# Patient Record
Sex: Male | Born: 1950 | Race: Black or African American | Hispanic: No | Marital: Married | Smoking: Never smoker
Health system: Southern US, Community
[De-identification: ages and names within clinical notes are randomized; demographics above are authoritative.]

## PROBLEM LIST (undated history)

## (undated) DIAGNOSIS — M10071 Idiopathic gout, right ankle and foot: Secondary | ICD-10-CM

## (undated) DIAGNOSIS — I469 Cardiac arrest, cause unspecified: Secondary | ICD-10-CM

## (undated) DIAGNOSIS — I509 Heart failure, unspecified: Secondary | ICD-10-CM

## (undated) DIAGNOSIS — R52 Pain, unspecified: Secondary | ICD-10-CM

## (undated) DIAGNOSIS — R0989 Other specified symptoms and signs involving the circulatory and respiratory systems: Secondary | ICD-10-CM

## (undated) DIAGNOSIS — R29898 Other symptoms and signs involving the musculoskeletal system: Secondary | ICD-10-CM

## (undated) DIAGNOSIS — S7410XA Injury of femoral nerve at hip and thigh level, unspecified leg, initial encounter: Secondary | ICD-10-CM

## (undated) DIAGNOSIS — I10 Essential (primary) hypertension: Secondary | ICD-10-CM

## (undated) DIAGNOSIS — I5023 Acute on chronic systolic (congestive) heart failure: Secondary | ICD-10-CM

## (undated) DIAGNOSIS — K683 Retroperitoneal hematoma: Secondary | ICD-10-CM

## (undated) DIAGNOSIS — N289 Disorder of kidney and ureter, unspecified: Secondary | ICD-10-CM

## (undated) DIAGNOSIS — M545 Low back pain, unspecified: Secondary | ICD-10-CM

## (undated) DIAGNOSIS — M25561 Pain in right knee: Secondary | ICD-10-CM

## (undated) DIAGNOSIS — E785 Hyperlipidemia, unspecified: Secondary | ICD-10-CM

## (undated) DIAGNOSIS — M6282 Rhabdomyolysis: Secondary | ICD-10-CM

## (undated) DIAGNOSIS — Z7901 Long term (current) use of anticoagulants: Secondary | ICD-10-CM

## (undated) DIAGNOSIS — E039 Hypothyroidism, unspecified: Secondary | ICD-10-CM

## (undated) DIAGNOSIS — N189 Chronic kidney disease, unspecified: Secondary | ICD-10-CM

## (undated) DIAGNOSIS — G5720 Lesion of femoral nerve, unspecified lower limb: Secondary | ICD-10-CM

## (undated) DIAGNOSIS — E119 Type 2 diabetes mellitus without complications: Secondary | ICD-10-CM

## (undated) DIAGNOSIS — J9600 Acute respiratory failure, unspecified whether with hypoxia or hypercapnia: Secondary | ICD-10-CM

## (undated) DIAGNOSIS — I4901 Ventricular fibrillation: Secondary | ICD-10-CM

## (undated) DIAGNOSIS — M898X6 Other specified disorders of bone, lower leg: Secondary | ICD-10-CM

## (undated) DIAGNOSIS — I499 Cardiac arrhythmia, unspecified: Secondary | ICD-10-CM

## (undated) DIAGNOSIS — J189 Pneumonia, unspecified organism: Secondary | ICD-10-CM

## (undated) DIAGNOSIS — M25571 Pain in right ankle and joints of right foot: Secondary | ICD-10-CM

## (undated) DIAGNOSIS — E669 Obesity, unspecified: Secondary | ICD-10-CM

## (undated) DIAGNOSIS — Z452 Encounter for adjustment and management of vascular access device: Secondary | ICD-10-CM

## (undated) DIAGNOSIS — N179 Acute kidney failure, unspecified: Secondary | ICD-10-CM

## (undated) DIAGNOSIS — R011 Cardiac murmur, unspecified: Secondary | ICD-10-CM

## (undated) DIAGNOSIS — N184 Chronic kidney disease, stage 4 (severe): Secondary | ICD-10-CM

## (undated) DIAGNOSIS — F40240 Claustrophobia: Secondary | ICD-10-CM

## (undated) DIAGNOSIS — I480 Paroxysmal atrial fibrillation: Secondary | ICD-10-CM

## (undated) DIAGNOSIS — R4182 Altered mental status, unspecified: Secondary | ICD-10-CM

## (undated) DIAGNOSIS — I719 Aortic aneurysm of unspecified site, without rupture: Secondary | ICD-10-CM

## (undated) DIAGNOSIS — I447 Left bundle-branch block, unspecified: Secondary | ICD-10-CM

## (undated) DIAGNOSIS — M549 Dorsalgia, unspecified: Secondary | ICD-10-CM

## (undated) DIAGNOSIS — I5022 Chronic systolic (congestive) heart failure: Secondary | ICD-10-CM

## (undated) DIAGNOSIS — G8929 Other chronic pain: Secondary | ICD-10-CM

## (undated) DIAGNOSIS — J9601 Acute respiratory failure with hypoxia: Secondary | ICD-10-CM

## (undated) DIAGNOSIS — G9340 Encephalopathy, unspecified: Secondary | ICD-10-CM

## (undated) DIAGNOSIS — E1169 Type 2 diabetes mellitus with other specified complication: Secondary | ICD-10-CM

## (undated) DIAGNOSIS — R0609 Other forms of dyspnea: Secondary | ICD-10-CM

## (undated) DIAGNOSIS — D62 Acute posthemorrhagic anemia: Secondary | ICD-10-CM

## (undated) DIAGNOSIS — I4892 Unspecified atrial flutter: Secondary | ICD-10-CM

## (undated) DIAGNOSIS — R58 Hemorrhage, not elsewhere classified: Secondary | ICD-10-CM

## (undated) DIAGNOSIS — I7789 Other specified disorders of arteries and arterioles: Secondary | ICD-10-CM

## (undated) DIAGNOSIS — I351 Nonrheumatic aortic (valve) insufficiency: Secondary | ICD-10-CM

## (undated) DIAGNOSIS — I8390 Asymptomatic varicose veins of unspecified lower extremity: Secondary | ICD-10-CM

## (undated) DIAGNOSIS — Z1211 Encounter for screening for malignant neoplasm of colon: Secondary | ICD-10-CM

## (undated) DIAGNOSIS — Z79899 Other long term (current) drug therapy: Secondary | ICD-10-CM

## (undated) DIAGNOSIS — K648 Other hemorrhoids: Secondary | ICD-10-CM

## (undated) DIAGNOSIS — R5381 Other malaise: Secondary | ICD-10-CM

## (undated) DIAGNOSIS — I5042 Chronic combined systolic (congestive) and diastolic (congestive) heart failure: Secondary | ICD-10-CM

## (undated) HISTORY — DX: Dorsalgia, unspecified: M54.9

## (undated) HISTORY — DX: Paroxysmal atrial fibrillation: I48.0

## (undated) HISTORY — DX: Retroperitoneal hematoma: K68.3

## (undated) HISTORY — DX: Type 2 diabetes mellitus with other specified complication: E11.69

## (undated) HISTORY — DX: Rhabdomyolysis: M62.82

## (undated) HISTORY — DX: Encounter for screening for malignant neoplasm of colon: Z12.11

## (undated) HISTORY — DX: Pain in right knee: M25.561

## (undated) HISTORY — DX: Other specified disorders of bone, lower leg: M89.8X6

## (undated) HISTORY — DX: Acute on chronic systolic (congestive) heart failure: I50.23

## (undated) HISTORY — DX: Unspecified atrial flutter: I48.92

## (undated) HISTORY — DX: Cardiac arrhythmia, unspecified: I49.9

## (undated) HISTORY — DX: Acute posthemorrhagic anemia: D62

## (undated) HISTORY — DX: Long term (current) use of anticoagulants: Z79.01

## (undated) HISTORY — DX: Pain, unspecified: R52

## (undated) HISTORY — DX: Chronic combined systolic (congestive) and diastolic (congestive) heart failure: I50.42

## (undated) HISTORY — DX: Acute kidney failure, unspecified: N17.9

## (undated) HISTORY — DX: Chronic systolic (congestive) heart failure: I50.22

## (undated) HISTORY — DX: Chronic kidney disease, stage 4 (severe): N18.4

## (undated) HISTORY — DX: Other symptoms and signs involving the musculoskeletal system: R29.898

## (undated) HISTORY — DX: Type 2 diabetes mellitus without complications: E11.9

## (undated) HISTORY — DX: Hemorrhage, not elsewhere classified: R58

## (undated) HISTORY — DX: Encephalopathy, unspecified: G93.40

## (undated) HISTORY — DX: Pneumonia, unspecified organism: J18.9

## (undated) HISTORY — DX: Low back pain, unspecified: M54.50

## (undated) HISTORY — DX: Other long term (current) drug therapy: Z79.899

## (undated) HISTORY — DX: Chronic kidney disease, unspecified: N18.9

## (undated) HISTORY — DX: Pain in right ankle and joints of right foot: M25.571

## (undated) HISTORY — DX: Aortic aneurysm of unspecified site, without rupture: I71.9

## (undated) HISTORY — DX: Disorder of kidney and ureter, unspecified: N28.9

## (undated) HISTORY — DX: Hypothyroidism, unspecified: E03.9

## (undated) HISTORY — DX: Other specified symptoms and signs involving the circulatory and respiratory systems: R09.89

## (undated) HISTORY — DX: Acute respiratory failure, unspecified whether with hypoxia or hypercapnia: J96.00

## (undated) HISTORY — DX: Idiopathic gout, right ankle and foot: M10.071

## (undated) HISTORY — DX: Acute respiratory failure with hypoxia: J96.01

## (undated) HISTORY — DX: Low back pain: M54.5

## (undated) HISTORY — DX: Left bundle-branch block, unspecified: I44.7

## (undated) HISTORY — DX: Ventricular fibrillation: I49.01

## (undated) HISTORY — DX: Obesity, unspecified: E66.9

## (undated) HISTORY — DX: Other malaise: R53.81

## (undated) HISTORY — DX: Other hemorrhoids: K64.8

## (undated) HISTORY — DX: Cardiac arrest, cause unspecified: I46.9

## (undated) HISTORY — DX: Encounter for adjustment and management of vascular access device: Z45.2

## (undated) HISTORY — DX: Other specified disorders of arteries and arterioles: I77.89

## (undated) HISTORY — DX: Hyperlipidemia, unspecified: E78.5

## (undated) HISTORY — DX: Altered mental status, unspecified: R41.82

## (undated) HISTORY — DX: Lesion of femoral nerve, unspecified lower limb: G57.20

## (undated) HISTORY — DX: Injury of femoral nerve at hip and thigh level, unspecified leg, initial encounter: S74.10XA

## (undated) HISTORY — DX: Nonrheumatic aortic (valve) insufficiency: I35.1

## (undated) SURGERY — CARDIOVERSION
Anesthesia: Moderate Sedation

## (undated) SURGERY — COLONOSCOPY
Anesthesia: Monitor Anesthesia Care

---

## 2010-09-25 HISTORY — PX: FINGER SURGERY: SHX640

## 2010-09-25 HISTORY — PX: OTHER SURGICAL HISTORY: SHX169

## 2011-03-23 ENCOUNTER — Emergency Department (HOSPITAL_COMMUNITY): Payer: BC Managed Care – PPO

## 2011-03-23 ENCOUNTER — Emergency Department (HOSPITAL_COMMUNITY)
Admission: EM | Admit: 2011-03-23 | Discharge: 2011-03-23 | Disposition: A | Discharge status: Home / Self Care | Payer: BC Managed Care – PPO | Attending: Emergency Medicine | Admitting: Emergency Medicine

## 2011-03-23 DIAGNOSIS — M7989 Other specified soft tissue disorders: Secondary | ICD-10-CM | POA: Insufficient documentation

## 2011-03-23 DIAGNOSIS — M8569 Other cyst of bone, multiple sites: Secondary | ICD-10-CM | POA: Insufficient documentation

## 2011-03-23 DIAGNOSIS — M79609 Pain in unspecified limb: Secondary | ICD-10-CM | POA: Insufficient documentation

## 2011-03-23 DIAGNOSIS — IMO0002 Reserved for concepts with insufficient information to code with codable children: Secondary | ICD-10-CM | POA: Insufficient documentation

## 2011-03-23 DIAGNOSIS — X500XXA Overexertion from strenuous movement or load, initial encounter: Secondary | ICD-10-CM | POA: Insufficient documentation

## 2011-03-23 DIAGNOSIS — Y93H2 Activity, gardening and landscaping: Secondary | ICD-10-CM | POA: Insufficient documentation

## 2011-03-24 ENCOUNTER — Other Ambulatory Visit: Payer: Self-pay | Admitting: General Surgery

## 2011-03-24 DIAGNOSIS — H219 Unspecified disorder of iris and ciliary body: Secondary | ICD-10-CM

## 2011-03-31 ENCOUNTER — Other Ambulatory Visit: Payer: Self-pay

## 2011-05-10 ENCOUNTER — Ambulatory Visit (HOSPITAL_BASED_OUTPATIENT_CLINIC_OR_DEPARTMENT_OTHER)
Admission: RE | Admit: 2011-05-10 | Discharge: 2011-05-10 | Disposition: A | Discharge status: Home / Self Care | Payer: BC Managed Care – PPO | Source: Ambulatory Visit | Attending: Orthopedic Surgery | Admitting: Orthopedic Surgery

## 2011-05-10 ENCOUNTER — Other Ambulatory Visit: Payer: Self-pay | Admitting: Orthopedic Surgery

## 2011-05-10 DIAGNOSIS — Z01810 Encounter for preprocedural cardiovascular examination: Secondary | ICD-10-CM | POA: Insufficient documentation

## 2011-05-10 DIAGNOSIS — E669 Obesity, unspecified: Secondary | ICD-10-CM | POA: Insufficient documentation

## 2011-05-10 DIAGNOSIS — M8448XA Pathological fracture, other site, initial encounter for fracture: Secondary | ICD-10-CM | POA: Insufficient documentation

## 2011-05-10 DIAGNOSIS — I10 Essential (primary) hypertension: Secondary | ICD-10-CM | POA: Insufficient documentation

## 2011-05-11 LAB — POCT I-STAT, CHEM 8
BUN: 20 mg/dL (ref 6–23)
Calcium, Ion: 1.15 mmol/L (ref 1.12–1.32)
Chloride: 106 mEq/L (ref 96–112)
Creatinine, Ser: 1.4 mg/dL — ABNORMAL HIGH (ref 0.50–1.35)
Glucose, Bld: 113 mg/dL — ABNORMAL HIGH (ref 70–99)
HCT: 43 % (ref 39.0–52.0)
Hemoglobin: 14.6 g/dL (ref 13.0–17.0)
Potassium: 3.2 mEq/L — ABNORMAL LOW (ref 3.5–5.1)
Sodium: 143 mEq/L (ref 135–145)
TCO2: 26 mmol/L (ref 0–100)

## 2011-05-23 NOTE — Op Note (Signed)
  [name], [name] NO.:  [id number]  MEDICAL RECORD NO.:  [id number]  LOCATION:                                 FACILITY:  PHYSICIAN:  Sheral Apley. 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  PROCEDURE:  Curettage and bone grafting above.  SURGEON:  Sheral Apley. Burney Gauze, MD  ANESTHESIA:  General.  ASSISTANT:  Kuzma.  TOURNIQUET TIME:  51 minutes.  COMPLICATIONS:  None.  DRAINS:  None.  One specimen sent.  The patient was taken to the operating suite after induction of adequate general anesthesia.  Right upper extremity prepped and draped in sterile fashion.  An Esmarch was used to exsanguinate the limb.  Tourniquet was inflated to 250 mmHg.  At this point in time incision was made over the middle phalanx of the right ring finger.  Skin was incised. A small incision was made in the extensor mechanism.  Cortical window was made. A large enchondromatous-type lesion was encountered.  It was fully curetted using several size and shape curettes.  It was thoroughly irrigated.  Intraoperative fluoroscopy was used to determine that the tumor had been essentially resected.  It was then packed full with Trinity bone graft.  Intraoperative fluoroscopy confirmed that the pathologic fracture had been adequately debrided and bone grafted.  The finger was stable and no fixation was used.  It was irrigated, loosely closed in layers of 4-0 Vicryl to realign the extensor mechanism, 4-0 nylon on the skin.  Xeroform, 4x4s and ulnar gutter splint was applied.  The patient tolerated the procedure well and went to recovery room in stable  fashion.     Sheral Apley Burney Gauze, M.D.     MAW/MEDQ  D:  05/10/2011  T:  05/10/2011  Job:  [id number]  Electronically Signed by Charlotte Crumb M.D. on 05/23/2011 09:36:30 AM

## 2011-12-25 ENCOUNTER — Other Ambulatory Visit: Payer: Self-pay | Admitting: Internal Medicine

## 2011-12-25 ENCOUNTER — Ambulatory Visit
Admission: RE | Admit: 2011-12-25 | Discharge: 2011-12-25 | Disposition: A | Discharge status: Home / Self Care | Payer: BC Managed Care – PPO | Source: Ambulatory Visit | Attending: Internal Medicine | Admitting: Internal Medicine

## 2011-12-25 DIAGNOSIS — R05 Cough: Secondary | ICD-10-CM

## 2012-01-24 DIAGNOSIS — R0600 Dyspnea, unspecified: Secondary | ICD-10-CM

## 2012-01-24 DIAGNOSIS — R0609 Other forms of dyspnea: Secondary | ICD-10-CM

## 2012-01-24 HISTORY — DX: Other forms of dyspnea: R06.09

## 2012-01-24 HISTORY — DX: Dyspnea, unspecified: R06.00

## 2012-01-26 ENCOUNTER — Other Ambulatory Visit: Payer: Self-pay | Admitting: Interventional Cardiology

## 2012-02-01 ENCOUNTER — Inpatient Hospital Stay (HOSPITAL_BASED_OUTPATIENT_CLINIC_OR_DEPARTMENT_OTHER): Admit: 2012-02-01 | Payer: BC Managed Care – PPO | Admitting: Interventional Cardiology

## 2012-02-01 ENCOUNTER — Encounter (HOSPITAL_BASED_OUTPATIENT_CLINIC_OR_DEPARTMENT_OTHER): Payer: Self-pay

## 2012-02-01 SURGERY — JV LEFT AND RIGHT HEART CATHETERIZATION WITH CORONARY ANGIOGRAM
Anesthesia: Moderate Sedation

## 2012-02-13 ENCOUNTER — Inpatient Hospital Stay (HOSPITAL_COMMUNITY)
Admission: AD | Admit: 2012-02-13 | Discharge: 2012-02-15 | DRG: 544 | Disposition: A | Discharge status: Home / Self Care | Payer: BC Managed Care – PPO | Source: Ambulatory Visit | Attending: Interventional Cardiology | Admitting: Interventional Cardiology

## 2012-02-13 ENCOUNTER — Encounter (HOSPITAL_COMMUNITY): Payer: Self-pay | Admitting: Interventional Cardiology

## 2012-02-13 DIAGNOSIS — I351 Nonrheumatic aortic (valve) insufficiency: Secondary | ICD-10-CM

## 2012-02-13 DIAGNOSIS — I129 Hypertensive chronic kidney disease with stage 1 through stage 4 chronic kidney disease, or unspecified chronic kidney disease: Secondary | ICD-10-CM | POA: Diagnosis present

## 2012-02-13 DIAGNOSIS — Z79899 Other long term (current) drug therapy: Secondary | ICD-10-CM

## 2012-02-13 DIAGNOSIS — Z7982 Long term (current) use of aspirin: Secondary | ICD-10-CM

## 2012-02-13 DIAGNOSIS — I359 Nonrheumatic aortic valve disorder, unspecified: Secondary | ICD-10-CM | POA: Diagnosis present

## 2012-02-13 DIAGNOSIS — Z6833 Body mass index (BMI) 33.0-33.9, adult: Secondary | ICD-10-CM

## 2012-02-13 DIAGNOSIS — I5042 Chronic combined systolic (congestive) and diastolic (congestive) heart failure: Secondary | ICD-10-CM | POA: Diagnosis present

## 2012-02-13 DIAGNOSIS — E669 Obesity, unspecified: Secondary | ICD-10-CM | POA: Diagnosis present

## 2012-02-13 DIAGNOSIS — I5022 Chronic systolic (congestive) heart failure: Secondary | ICD-10-CM | POA: Diagnosis present

## 2012-02-13 DIAGNOSIS — N179 Acute kidney failure, unspecified: Secondary | ICD-10-CM | POA: Diagnosis present

## 2012-02-13 DIAGNOSIS — I10 Essential (primary) hypertension: Secondary | ICD-10-CM | POA: Diagnosis present

## 2012-02-13 DIAGNOSIS — N189 Chronic kidney disease, unspecified: Secondary | ICD-10-CM | POA: Diagnosis present

## 2012-02-13 DIAGNOSIS — E785 Hyperlipidemia, unspecified: Secondary | ICD-10-CM

## 2012-02-13 DIAGNOSIS — I7789 Other specified disorders of arteries and arterioles: Secondary | ICD-10-CM | POA: Diagnosis present

## 2012-02-13 DIAGNOSIS — I447 Left bundle-branch block, unspecified: Secondary | ICD-10-CM

## 2012-02-13 DIAGNOSIS — I5023 Acute on chronic systolic (congestive) heart failure: Principal | ICD-10-CM | POA: Diagnosis present

## 2012-02-13 DIAGNOSIS — I491 Atrial premature depolarization: Secondary | ICD-10-CM | POA: Diagnosis present

## 2012-02-13 DIAGNOSIS — F40298 Other specified phobia: Secondary | ICD-10-CM | POA: Diagnosis present

## 2012-02-13 DIAGNOSIS — I498 Other specified cardiac arrhythmias: Secondary | ICD-10-CM | POA: Diagnosis present

## 2012-02-13 HISTORY — DX: Asymptomatic varicose veins of unspecified lower extremity: I83.90

## 2012-02-13 HISTORY — DX: Obesity, unspecified: E66.9

## 2012-02-13 HISTORY — DX: Cardiac arrhythmia, unspecified: I49.9

## 2012-02-13 HISTORY — DX: Heart failure, unspecified: I50.9

## 2012-02-13 HISTORY — DX: Low back pain: M54.5

## 2012-02-13 HISTORY — DX: Cardiac murmur, unspecified: R01.1

## 2012-02-13 HISTORY — DX: Other forms of dyspnea: R06.09

## 2012-02-13 HISTORY — DX: Claustrophobia: F40.240

## 2012-02-13 HISTORY — DX: Other specified disorders of arteries and arterioles: I77.89

## 2012-02-13 HISTORY — DX: Other chronic pain: G89.29

## 2012-02-13 HISTORY — DX: Low back pain, unspecified: M54.50

## 2012-02-13 HISTORY — DX: Chronic systolic (congestive) heart failure: I50.22

## 2012-02-13 HISTORY — DX: Essential (primary) hypertension: I10

## 2012-02-13 HISTORY — DX: Hyperlipidemia, unspecified: E78.5

## 2012-02-13 HISTORY — DX: Nonrheumatic aortic (valve) insufficiency: I35.1

## 2012-02-13 HISTORY — DX: Left bundle-branch block, unspecified: I44.7

## 2012-02-13 LAB — COMPREHENSIVE METABOLIC PANEL
ALT: 198 U/L — ABNORMAL HIGH (ref 0–53)
AST: 104 U/L — ABNORMAL HIGH (ref 0–37)
Albumin: 3.5 g/dL (ref 3.5–5.2)
Alkaline Phosphatase: 58 U/L (ref 39–117)
BUN: 31 mg/dL — ABNORMAL HIGH (ref 6–23)
Chloride: 103 mEq/L (ref 96–112)
Potassium: 2.8 mEq/L — ABNORMAL LOW (ref 3.5–5.1)
Sodium: 141 mEq/L (ref 135–145)
Total Bilirubin: 0.6 mg/dL (ref 0.3–1.2)
Total Protein: 6.3 g/dL (ref 6.0–8.3)

## 2012-02-13 LAB — LIPID PANEL
Cholesterol: 161 mg/dL (ref 0–200)
Total CHOL/HDL Ratio: 3.8 RATIO
Triglycerides: 151 mg/dL — ABNORMAL HIGH (ref <150)
VLDL: 30 mg/dL (ref 0–40)

## 2012-02-13 LAB — CARDIAC PANEL(CRET KIN+CKTOT+MB+TROPI)
CK, MB: 5 ng/mL — ABNORMAL HIGH (ref 0.3–4.0)
Relative Index: 1.4 (ref 0.0–2.5)
Total CK: 366 U/L — ABNORMAL HIGH (ref 7–232)

## 2012-02-13 LAB — CBC
MCHC: 36.1 g/dL — ABNORMAL HIGH (ref 30.0–36.0)
Platelets: 283 10*3/uL (ref 150–400)
RDW: 14.6 % (ref 11.5–15.5)
WBC: 8.9 10*3/uL (ref 4.0–10.5)

## 2012-02-13 LAB — PROTIME-INR: INR: 1.13 (ref 0.00–1.49)

## 2012-02-13 MED ORDER — SODIUM CHLORIDE 0.9 % IJ SOLN
3.0000 mL | Freq: Two times a day (BID) | INTRAMUSCULAR | Status: DC
  Administered 2012-02-13 – 2012-02-15 (×4): 3 mL via INTRAVENOUS

## 2012-02-13 MED ORDER — LISINOPRIL 40 MG PO TABS
40.0000 mg | ORAL_TABLET | Freq: Every day | ORAL | Status: DC
  Administered 2012-02-13 – 2012-02-15 (×3): 40 mg via ORAL
  Filled 2012-02-13 (×3): qty 1

## 2012-02-13 MED ORDER — SODIUM CHLORIDE 0.9 % IJ SOLN: 3.0000 mL | INTRAMUSCULAR | Status: DC | PRN

## 2012-02-13 MED ORDER — POTASSIUM CHLORIDE CRYS ER 20 MEQ PO TBCR
40.0000 meq | EXTENDED_RELEASE_TABLET | Freq: Three times a day (TID) | ORAL | Status: DC
  Administered 2012-02-13: 40 meq via ORAL
  Filled 2012-02-13 (×3): qty 2

## 2012-02-13 MED ORDER — ENOXAPARIN SODIUM 40 MG/0.4ML ~~LOC~~ SOLN
40.0000 mg | SUBCUTANEOUS | Status: DC
  Administered 2012-02-13: 40 mg via SUBCUTANEOUS
  Filled 2012-02-13 (×2): qty 0.4

## 2012-02-13 MED ORDER — ALPRAZOLAM 0.5 MG PO TABS
0.5000 mg | ORAL_TABLET | Freq: Three times a day (TID) | ORAL | Status: DC | PRN
  Administered 2012-02-13 – 2012-02-15 (×2): 0.5 mg via ORAL
  Filled 2012-02-13 (×2): qty 1

## 2012-02-13 MED ORDER — SODIUM CHLORIDE 0.9 % IV SOLN: 250.0000 mL | INTRAVENOUS | Status: DC | PRN

## 2012-02-13 MED ORDER — FUROSEMIDE 10 MG/ML IJ SOLN
40.0000 mg | Freq: Two times a day (BID) | INTRAMUSCULAR | Status: DC
  Administered 2012-02-13 – 2012-02-14 (×2): 40 mg via INTRAVENOUS
  Filled 2012-02-13 (×4): qty 4

## 2012-02-13 MED ORDER — ONDANSETRON HCL 4 MG/2ML IJ SOLN: 4.0000 mg | Freq: Four times a day (QID) | INTRAMUSCULAR | Status: DC | PRN

## 2012-02-13 MED ORDER — ASPIRIN EC 81 MG PO TBEC
81.0000 mg | DELAYED_RELEASE_TABLET | Freq: Every day | ORAL | Status: DC
  Administered 2012-02-14 – 2012-02-15 (×2): 81 mg via ORAL
  Filled 2012-02-13 (×3): qty 1

## 2012-02-13 MED ORDER — METOPROLOL TARTRATE 50 MG PO TABS
50.0000 mg | ORAL_TABLET | Freq: Two times a day (BID) | ORAL | Status: DC
  Administered 2012-02-13 – 2012-02-14 (×3): 50 mg via ORAL
  Filled 2012-02-13 (×5): qty 1

## 2012-02-13 MED ORDER — ACETAMINOPHEN 325 MG PO TABS: 650.0000 mg | ORAL_TABLET | ORAL | Status: DC | PRN

## 2012-02-13 NOTE — H&P (Signed)
Eric [name] is a 61 y.o. male  Admit date: 02/13/2012  Referring Physician: Glendale Chard, MD Primary Cardiologist::   H.W.B. Blenda Bridegroom, MD Chief complaint / reason for admission:  Acute on chronic CHF  HPI: Eric [name] is 61 years of age and in March began experiencing dyspnea on exertion, palpitations, and lower extremity swelling. This was during a time when his hypertension medication regimen was being adjusted. He has a greater than 20 year history of hypertension on medical therapy. There is no prior history of coronary disease or heart disease. When we saw him in April he was noted to have a left bundle branch block on EKG,  a gallop rhythm on exam, and echo demonstrated significant left ventricular systolic dysfunction with an EF in the 25-35% range, aortic root enlargement, and moderate AR. He additionally had a Holter monitor performed which demonstrated frequent PACs. No significant ventricular ectopy was noted.  Over the past 3-4 weeks, we have attempted to make adjustments in his medical regimen to treat heart failure. This has included discontinuing diltiazem, doxazosin, and triamterene HCTZ. We have added metoprolol and furosemide(within the past 24 hours). He was already taking lisinopril. After the initiation of beta blocker therapy, he became somewhat orthopneic, and is being admitted to the hospital for more aggressive therapy.    PMH:    Past Medical History  Diagnosis Date  . Claustrophobia   . Heart murmur   . CHF (congestive heart failure)   . Hypertension     PSH:    Past Surgical History  Procedure Date  . Skin melanocytoma excision 2012  . Finger surgery 2012    ALLERGIES:   Review of patient's allergies indicates no known allergies.  Prior to Admit Meds:   No prescriptions prior to admission   Family HX:    Family History  Problem Relation Age of Onset  . Hypertension Mother   . Heart disease Mother   . Hypertension Father   . Heart disease  Father   . Heart failure Mother   . Diabetes Mother   . Heart failure Father   . Heart failure Mother    Social HX:    History   Social History  . Marital Status: Married    Spouse Name: Eric [name]    Number of Children: 1  . Years of Education: N/A   Occupational History  .     Social History Main Topics  . Smoking status: Never Smoker   . Smokeless tobacco: Not on file  . Alcohol Use: No  . Drug Use: No  . Sexually Active: Yes   Other Topics Concern  . 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     ROS:he denies neurological complaints. No recent ankle edema. He has not had syncope. There is no history of arrhythmia. No GI bleeding. No history of bowel heart disease. Hypertension is been a long-standing problem. He has anxiety and claustrophobia.  Physical Exam: Pulse 121, temperature 98.8 F (37.1 C), temperature source Oral, height 5' 9.5" (1.765 m), weight 107.004 kg (235 lb 14.4 oz), SpO2 95.00%.    Patient is doing well although he appears apprehensive.  The skin is warm and dry.  No significant JVD is noted.  The lungs reveal faint basilar crackles.  The cardiac exam reveals a rapid and irregular rhythm. Gallop is heard.  Abdomen soft. No fluid wave is  noted. The liver is nonpalpable.  Extremities reveal no edema.  Neurological exam is intact. Labs:   Lab Results  Component Value Date   WBC 8.9 02/13/2012   HGB 14.2 02/13/2012   HCT 39.3 02/13/2012   MCV 85.6 02/13/2012   PLT 283 02/13/2012     Lab 02/13/12 1659  NA 141  K 2.8*  CL 103  CO2 23  BUN 31*  CREATININE 1.53*  CALCIUM 9.0  PROT 6.3  BILITOT 0.6  ALKPHOS 58  ALT 198*  AST 104*  GLUCOSE 108*   Lab Results  Component Value Date   CKTOTAL 366* 02/13/2012   CKMB 5.0* 02/13/2012   TROPONINI <0.30 02/13/2012   ECHO:  Physician Review 01/24/2012 Conclusions: 1. Moderate left ventricular dilatation. 2. Left  ventricular ejection fraction estimated by 2D at 30-35 percent. 3. There is moderate concentric left ventricle hypertrophy. 4. Analysis of mitral valve inflow, pulmonary vein Doppler and tissue Doppler suggests grade II diastolic dysfunction with elevated left atrial pressure. 5. There is moderate aortic root dilatation. 6. Moderate aortic valve regurgitation. 7. Mild mitral valve regurgitation. 8. Moderate left atrial enlargement. 9. Mild pulmonic valve regurgitation. 10. There is mild tricuspid regurgitation. 11. Mildly elevated estimated right ventricular systolic pressure.   Radiology:  pending  EKG:   pending  ASSESSMENT:   1. Acute on Chronic decompensated systolic heart failure with LVEF 30-35%  2. Obesity  3. Moderate AR by echo in association aortic root enlargement  4. Claustrophobia  Plan:  1. Aggressive diuresis 2. Eventual left and right heart cath 3. Determine the role if any that AR is playing.  Eric [name] 02/13/2012 6:11 PM

## 2012-02-14 ENCOUNTER — Inpatient Hospital Stay (HOSPITAL_COMMUNITY): Payer: BC Managed Care – PPO

## 2012-02-14 LAB — IRON AND TIBC: TIBC: 297 ug/dL (ref 215–435)

## 2012-02-14 LAB — BASIC METABOLIC PANEL
BUN: 31 mg/dL — ABNORMAL HIGH (ref 6–23)
CO2: 25 mEq/L (ref 19–32)
Chloride: 106 mEq/L (ref 96–112)
Glucose, Bld: 113 mg/dL — ABNORMAL HIGH (ref 70–99)
Potassium: 2.8 mEq/L — ABNORMAL LOW (ref 3.5–5.1)

## 2012-02-14 LAB — POTASSIUM: Potassium: 3.2 mEq/L — ABNORMAL LOW (ref 3.5–5.1)

## 2012-02-14 LAB — HEMOGLOBIN A1C: Mean Plasma Glucose: 128 mg/dL — ABNORMAL HIGH (ref <117)

## 2012-02-14 MED ORDER — SPIRONOLACTONE 25 MG PO TABS
25.0000 mg | ORAL_TABLET | Freq: Two times a day (BID) | ORAL | Status: DC
  Administered 2012-02-14 – 2012-02-15 (×3): 25 mg via ORAL
  Filled 2012-02-14 (×5): qty 1

## 2012-02-14 MED ORDER — POTASSIUM CHLORIDE CRYS ER 20 MEQ PO TBCR
40.0000 meq | EXTENDED_RELEASE_TABLET | Freq: Three times a day (TID) | ORAL | Status: DC
  Administered 2012-02-14 (×3): 40 meq via ORAL
  Filled 2012-02-14 (×4): qty 2

## 2012-02-14 MED ORDER — POTASSIUM CHLORIDE CRYS ER 20 MEQ PO TBCR: 60.0000 meq | EXTENDED_RELEASE_TABLET | Freq: Three times a day (TID) | ORAL | Status: DC

## 2012-02-14 MED ORDER — FUROSEMIDE 10 MG/ML IJ SOLN
80.0000 mg | Freq: Three times a day (TID) | INTRAMUSCULAR | Status: DC
  Administered 2012-02-14 – 2012-02-15 (×3): 80 mg via INTRAVENOUS
  Filled 2012-02-14 (×7): qty 8

## 2012-02-14 MED ORDER — FUROSEMIDE 10 MG/ML IJ SOLN
40.0000 mg | Freq: Four times a day (QID) | INTRAMUSCULAR | Status: DC
  Filled 2012-02-14 (×4): qty 4

## 2012-02-14 NOTE — Progress Notes (Signed)
Patient Name: Eric [name] Date of Encounter: 02/14/2012    SUBJECTIVE:Better night than previous and able to sleep. Has no chest pain or other complaint.  TELEMETRY:  Sinus tach with PAC's: Filed Vitals:   02/13/12 1904 02/13/12 2042 02/14/12 0216 02/14/12 0601  BP: 134/88 124/84 110/76 131/80  Pulse: 108 94 100 64  Temp:  98.4 F (36.9 C) 98.1 F (36.7 C) 98.6 F (37 C)  TempSrc:  Oral Oral Oral  Resp:  20 18 18   Height:      Weight:    106.414 kg (234 lb 9.6 oz)  SpO2: 96% 98% 100% 96%    Intake/Output Summary (Last 24 hours) at 02/14/12 0838 Last data filed at 02/14/12 0825  Gross per 24 hour  Intake    613 ml  Output   1375 ml  Net   -762 ml    LABS: Basic Metabolic Panel:  Basename 02/14/12 0620 02/13/12 1659  NA 142 141  K 2.8* 2.8*  CL 106 103  CO2 25 23  GLUCOSE 113* 108*  BUN 31* 31*  CREATININE 1.54* 1.53*  CALCIUM 8.8 9.0  MG -- 2.0  PHOS -- --   CBC:  Basename 02/13/12 1659  WBC 8.9  NEUTROABS --  HGB 14.2  HCT 39.3  MCV 85.6  PLT 283   Cardiac Enzymes:  Basename 02/13/12 1659  CKTOTAL 366*  CKMB 5.0*  CKMBINDEX --  TROPONINI <0.30   BNP    Component Value Date/Time   PROBNP 7452.0* 02/13/2012 1828  . Fasting Lipid Panel:  Basename 02/13/12 1829  CHOL 161  HDL 42  LDLCALC 89  TRIG 151*  CHOLHDL 3.8  LDLDIRECT --    Radiology/Studies:   RADIOLOGY REPORT*  Clinical Data: Follow-up congestive heart failure.  CHEST - 2 VIEW  Comparison: 12/25/2011  Findings: Moderate cardiomegaly remains stable. Symmetric  interstitial infiltrates and bibasilar Kerley B lines show no  significant change, consistent with mild pulmonary edema. Mild  increase in size of tiny bilateral pleural effusions noted. No  evidence of pulmonary consolidation.  IMPRESSION:  Mild congestive heart failure, with increased size of tiny  bilateral pleural effusions and stable mild interstitial edema.  Original Report Authenticated By: Marlaine Hind,  M.D.  Physical Exam: Blood pressure 131/80, pulse 64, temperature 98.6 F (37 C), temperature source Oral, resp. rate 18, height 5' 9.5" (1.765 m), weight 106.414 kg (234 lb 9.6 oz), SpO2 96.00%. Weight change:    No neck vein distension  Decreased basilar breath sounds  No edema.  ASSESSMENT:  1. Acute on chronic systolic heart failure, with mild diuresis  2. Hypertension, improved  3. Acute on chronic kidney injury   Plan:  1. Increase diuresis 2. Replete potassium 3. Discussed the severity of his problem and possible need for advanced heart failure measures such as AICD/BiV resynchronization, and ischemic evaluation once controlled on medical regimen.  Demetrios Isaacs 02/14/2012, 8:38 AM

## 2012-02-14 NOTE — Progress Notes (Signed)
PT K 2.8 this AM. Pt has 40 IV lasix BID and 40 PO KCL TID. Dr. Lilia Argue notified. Orders to increase PO KCL to 68mEq TID. Will continue to monitor.

## 2012-02-15 LAB — BASIC METABOLIC PANEL
Chloride: 104 mEq/L (ref 96–112)
GFR calc Af Amer: 57 mL/min — ABNORMAL LOW (ref >90)
Potassium: 3.1 mEq/L — ABNORMAL LOW (ref 3.5–5.1)
Sodium: 143 mEq/L (ref 135–145)

## 2012-02-15 MED ORDER — SPIRONOLACTONE 25 MG PO TABS: 25.0000 mg | ORAL_TABLET | Freq: Two times a day (BID) | ORAL | Status: DC

## 2012-02-15 MED ORDER — POTASSIUM CHLORIDE CRYS ER 20 MEQ PO TBCR: 20.0000 meq | EXTENDED_RELEASE_TABLET | Freq: Two times a day (BID) | ORAL | Status: DC

## 2012-02-15 MED ORDER — FUROSEMIDE 40 MG PO TABS: 40.0000 mg | ORAL_TABLET | Freq: Two times a day (BID) | ORAL | Status: DC

## 2012-02-15 MED ORDER — METOPROLOL TARTRATE 50 MG PO TABS
75.0000 mg | ORAL_TABLET | Freq: Two times a day (BID) | ORAL | Status: DC
  Administered 2012-02-15: 75 mg via ORAL
  Filled 2012-02-15 (×2): qty 1

## 2012-02-15 MED ORDER — POTASSIUM CHLORIDE CRYS ER 20 MEQ PO TBCR
60.0000 meq | EXTENDED_RELEASE_TABLET | Freq: Once | ORAL | Status: AC
  Administered 2012-02-15: 60 meq via ORAL

## 2012-02-15 MED ORDER — METOPROLOL TARTRATE 50 MG PO TABS: 75.0000 mg | ORAL_TABLET | Freq: Two times a day (BID) | ORAL | Status: DC

## 2012-02-15 NOTE — Discharge Summary (Addendum)
Patient ID: Eric [name] MRN: [id number] DOB/AGE: Mar 17, 1951 61 y.o.  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 He has an ejection fraction of 35% by echo done recently. Heart these symptoms have been gradually developing for 6 weeks. We made an attempt to treat his heart failure as an outpatient over the past 7-10 days but were unsuccessful. He was admitted to the hospital for more aggressive diuresis and medication manipulation. He has responded nicely with a near 4 L (greater than 8 pound) diuresis and states improved breathing and exertional tolerance. Appropriate therapy has been started and titrated. Hypokalemia has been a problem. He is anxious to be discharged from the hospital. Sinus tachycardia with rates averaging 100 beats per minute persist despite gradual increase in beta blocker dose. He was admitted to the hospital on 75 mg of metoprolol daily and is discharged 01 150 mg daily. Aldactone is been added and his Lasix dose has been doubled. We will add digoxin once potassium is stable.  We also had frank discussions concerning the severe nature of his heart problem and the potential need for advanced heart failure measures including resynchronization therapy/AICD. On maximal medical therapy, we will give 3 months to see if there is LV recovery before proceeding with these measures.  He will have outpatient blood work tomorrow morning and I will see him in the office in 5 days.   Discharge Exam: Blood pressure 107/79, pulse 104,  temperature 97.8 F (36.6 C), temperature source Oral, resp. rate 18, height 5' 9.5" (1.765 m), weight 104.237 kg (229 lb 12.8 oz), SpO2 97.00%.    History gallop is still audible.  Lungs are clear to auscultation and percussion.  Is no lower extremity edema. Labs:   Lab Results  Component Value Date   WBC 8.9 02/13/2012   HGB 14.2 02/13/2012   HCT 39.3 02/13/2012   MCV 85.6 02/13/2012   PLT 283 02/13/2012    Lab 02/15/12 0602 02/13/12 1659  NA 143 --  K 3.1* --  CL 104 --  CO2 26 --  BUN 30* --  CREATININE 1.50* --  CALCIUM 8.5 --  PROT -- 6.3  BILITOT -- 0.6  ALKPHOS -- 58  ALT -- 198*  AST -- 104*  GLUCOSE 109* --   Lab Results  Component Value Date   CKTOTAL 366* 02/13/2012   CKMB 5.0* 02/13/2012   TROPONINI <0.30 02/13/2012    Lab Results  Component Value Date   CHOL 161 02/13/2012   Lab Results  Component Value Date   HDL 42 02/13/2012   Lab Results  Component Value Date   LDLCALC 89 02/13/2012   Lab Results  Component Value Date   TRIG 151* 02/13/2012   Lab Results  Component Value Date   CHOLHDL 3.8 02/13/2012   No results found for this basename: LDLDIRECT     Discharge weight: 228.8 pounds  Radiology: Chest x-ray on admission revealed cardiac enlargement and mild to moderate CHF EKG:  Normal sinus rhythm, left bundle branch block  FOLLOW UP PLANS AND APPOINTMENTS  Medication List  As of 02/15/2012  9:29 AM   STOP taking these medications  ibuprofen 200 MG tablet      metoprolol succinate 50 MG 24 hr tablet         TAKE these medications         aspirin 81 MG tablet   Take 81 mg by mouth 2 (two) times daily.      azelastine 137 MCG/SPRAY nasal spray   Commonly known as: ASTELIN   Place 2 sprays into the nose daily. Use in each nostril as directed      diazepam 5 MG tablet   Commonly known as: VALIUM   Take 5 mg by mouth every 6 (six) hours as needed. For sleep      fluticasone 50 MCG/ACT nasal spray   Commonly known as:  FLONASE   Place 2 sprays into the nose daily.      furosemide 40 MG tablet   Commonly known as: LASIX   Take 1 tablet (40 mg total) by mouth 2 (two) times daily.      lisinopril 40 MG tablet   Commonly known as: PRINIVIL,ZESTRIL   Take 40 mg by mouth daily.      metoprolol 50 MG tablet   Commonly known as: LOPRESSOR   Take 1.5 tablets (75 mg total) by mouth 2 (two) times daily.      potassium chloride SA 20 MEQ tablet   Commonly known as: K-DUR,KLOR-CON   Take 1 tablet (20 mEq total) by mouth 2 (two) times daily.      spironolactone 25 MG tablet   Commonly known as: ALDACTONE   Take 1 tablet (25 mg total) by mouth 2 (two) times daily.      VITAMIN D-3 PO   Take 1 tablet by mouth daily.           Follow-up Information    Follow up with Eric Grooms, MD on 02/21/2012. (BM[facility] and 11:45A office visit)    Contact information:   [address] [place] [phone]       Follow up with Eric Grooms, MD on 02/16/2012. ([facility] at [facility] any time after 7:30 A)    Contact information:   [address] [place] [phone]          BRING ALL MEDICATIONS WITH YOU TO FOLLOW UP APPOINTMENTS  Time spent with patient to include physician time: 30 minutes Signed: Sinclair [name] 02/15/2012, 9:29 AM

## 2012-02-15 NOTE — Discharge Instructions (Signed)
Call if dizziness, palpitations, dyspnea, or chest pain. Weight once you arrive at home with clothes off and record a similar weight daily thereafter.

## 2012-03-18 DIAGNOSIS — I4892 Unspecified atrial flutter: Principal | ICD-10-CM

## 2012-03-18 HISTORY — DX: Unspecified atrial flutter: I48.92

## 2012-03-19 ENCOUNTER — Inpatient Hospital Stay (HOSPITAL_COMMUNITY): Payer: BC Managed Care – PPO

## 2012-03-19 ENCOUNTER — Inpatient Hospital Stay (HOSPITAL_COMMUNITY)
Admission: AD | Admit: 2012-03-19 | Discharge: 2012-03-24 | DRG: 544 | Disposition: A | Discharge status: Home / Self Care | Payer: BC Managed Care – PPO | Source: Ambulatory Visit | Attending: Interventional Cardiology | Admitting: Interventional Cardiology

## 2012-03-19 DIAGNOSIS — N179 Acute kidney failure, unspecified: Secondary | ICD-10-CM | POA: Diagnosis present

## 2012-03-19 DIAGNOSIS — I5023 Acute on chronic systolic (congestive) heart failure: Secondary | ICD-10-CM | POA: Diagnosis present

## 2012-03-19 DIAGNOSIS — E669 Obesity, unspecified: Secondary | ICD-10-CM | POA: Diagnosis present

## 2012-03-19 DIAGNOSIS — N189 Chronic kidney disease, unspecified: Secondary | ICD-10-CM | POA: Diagnosis present

## 2012-03-19 DIAGNOSIS — I129 Hypertensive chronic kidney disease with stage 1 through stage 4 chronic kidney disease, or unspecified chronic kidney disease: Secondary | ICD-10-CM | POA: Diagnosis present

## 2012-03-19 DIAGNOSIS — I359 Nonrheumatic aortic valve disorder, unspecified: Secondary | ICD-10-CM | POA: Diagnosis present

## 2012-03-19 DIAGNOSIS — Z79899 Other long term (current) drug therapy: Secondary | ICD-10-CM

## 2012-03-19 DIAGNOSIS — I428 Other cardiomyopathies: Secondary | ICD-10-CM | POA: Diagnosis present

## 2012-03-19 DIAGNOSIS — E785 Hyperlipidemia, unspecified: Secondary | ICD-10-CM | POA: Diagnosis present

## 2012-03-19 DIAGNOSIS — I447 Left bundle-branch block, unspecified: Secondary | ICD-10-CM | POA: Diagnosis present

## 2012-03-19 DIAGNOSIS — I4892 Unspecified atrial flutter: Principal | ICD-10-CM | POA: Diagnosis present

## 2012-03-19 DIAGNOSIS — I5022 Chronic systolic (congestive) heart failure: Secondary | ICD-10-CM | POA: Diagnosis present

## 2012-03-19 LAB — BASIC METABOLIC PANEL
BUN: 39 mg/dL — ABNORMAL HIGH (ref 6–23)
GFR calc Af Amer: 41 mL/min — ABNORMAL LOW (ref >90)
GFR calc non Af Amer: 36 mL/min — ABNORMAL LOW (ref >90)
Potassium: 4.2 mEq/L (ref 3.5–5.1)
Sodium: 136 mEq/L (ref 135–145)

## 2012-03-19 LAB — CBC
HCT: 41.5 % (ref 39.0–52.0)
Hemoglobin: 14.1 g/dL (ref 13.0–17.0)
RBC: 4.64 MIL/uL (ref 4.22–5.81)
WBC: 8.1 10*3/uL (ref 4.0–10.5)

## 2012-03-19 LAB — DIFFERENTIAL
Lymphocytes Relative: 18 % (ref 12–46)
Lymphs Abs: 1.5 10*3/uL (ref 0.7–4.0)
Monocytes Absolute: 0.9 10*3/uL (ref 0.1–1.0)
Monocytes Relative: 12 % (ref 3–12)
Neutro Abs: 5.6 10*3/uL (ref 1.7–7.7)
Neutrophils Relative %: 70 % (ref 43–77)

## 2012-03-19 LAB — PROTIME-INR
INR: 1.22 (ref 0.00–1.49)
Prothrombin Time: 15.7 seconds — ABNORMAL HIGH (ref 11.6–15.2)

## 2012-03-19 MED ORDER — ASPIRIN 81 MG PO TABS: 81.0000 mg | ORAL_TABLET | Freq: Two times a day (BID) | ORAL | Status: DC

## 2012-03-19 MED ORDER — SODIUM CHLORIDE 0.9 % IJ SOLN
3.0000 mL | Freq: Two times a day (BID) | INTRAMUSCULAR | Status: DC
  Administered 2012-03-19 – 2012-03-24 (×9): 3 mL via INTRAVENOUS

## 2012-03-19 MED ORDER — SODIUM CHLORIDE 0.9 % IJ SOLN: 3.0000 mL | INTRAMUSCULAR | Status: DC | PRN

## 2012-03-19 MED ORDER — SPIRONOLACTONE 25 MG PO TABS
25.0000 mg | ORAL_TABLET | Freq: Two times a day (BID) | ORAL | Status: DC
  Administered 2012-03-19 – 2012-03-20 (×2): 25 mg via ORAL
  Filled 2012-03-19 (×4): qty 1

## 2012-03-19 MED ORDER — CARVEDILOL 25 MG PO TABS
25.0000 mg | ORAL_TABLET | Freq: Two times a day (BID) | ORAL | Status: DC
  Administered 2012-03-19 – 2012-03-23 (×8): 25 mg via ORAL
  Filled 2012-03-19 (×10): qty 1

## 2012-03-19 MED ORDER — FUROSEMIDE 10 MG/ML IJ SOLN
40.0000 mg | Freq: Two times a day (BID) | INTRAMUSCULAR | Status: DC
  Administered 2012-03-19 – 2012-03-20 (×2): 40 mg via INTRAVENOUS
  Filled 2012-03-19 (×4): qty 4

## 2012-03-19 MED ORDER — WARFARIN - PHARMACIST DOSING INPATIENT: Freq: Every day | Status: DC

## 2012-03-19 MED ORDER — POTASSIUM CHLORIDE CRYS ER 20 MEQ PO TBCR
20.0000 meq | EXTENDED_RELEASE_TABLET | Freq: Two times a day (BID) | ORAL | Status: DC
  Administered 2012-03-19: 20 meq via ORAL
  Filled 2012-03-19 (×3): qty 1

## 2012-03-19 MED ORDER — SODIUM CHLORIDE 0.9 % IV SOLN: 250.0000 mL | INTRAVENOUS | Status: DC | PRN

## 2012-03-19 MED ORDER — AMIODARONE HCL 200 MG PO TABS
400.0000 mg | ORAL_TABLET | Freq: Two times a day (BID) | ORAL | Status: DC
  Administered 2012-03-19 – 2012-03-20 (×2): 400 mg via ORAL
  Filled 2012-03-19 (×3): qty 2

## 2012-03-19 MED ORDER — WARFARIN VIDEO
Freq: Once | Status: AC
  Administered 2012-03-20: 10:00:00

## 2012-03-19 MED ORDER — HEPARIN (PORCINE) IN NACL 100-0.45 UNIT/ML-% IJ SOLN
1250.0000 [IU]/h | INTRAMUSCULAR | Status: DC
  Administered 2012-03-19: 1350 [IU]/h via INTRAVENOUS
  Administered 2012-03-20: 1250 [IU]/h via INTRAVENOUS
  Filled 2012-03-19 (×3): qty 250

## 2012-03-19 MED ORDER — ASPIRIN EC 81 MG PO TBEC
81.0000 mg | DELAYED_RELEASE_TABLET | Freq: Two times a day (BID) | ORAL | Status: DC
  Administered 2012-03-19 – 2012-03-24 (×10): 81 mg via ORAL
  Filled 2012-03-19 (×11): qty 1

## 2012-03-19 MED ORDER — COUMADIN BOOK
Freq: Once | Status: AC
  Administered 2012-03-19: 21:00:00
  Filled 2012-03-19: qty 1

## 2012-03-19 MED ORDER — WARFARIN SODIUM 7.5 MG PO TABS
7.5000 mg | ORAL_TABLET | Freq: Once | ORAL | Status: AC
  Administered 2012-03-19: 7.5 mg via ORAL
  Filled 2012-03-19: qty 1

## 2012-03-19 MED ORDER — AZELASTINE HCL 0.1 % NA SOLN
2.0000 | Freq: Every day | NASAL | Status: DC
  Administered 2012-03-19 – 2012-03-24 (×4): 2 via NASAL
  Filled 2012-03-19: qty 30

## 2012-03-19 MED ORDER — HEPARIN BOLUS VIA INFUSION
5000.0000 [IU] | Freq: Once | INTRAVENOUS | Status: AC
  Administered 2012-03-19: 5000 [IU] via INTRAVENOUS
  Filled 2012-03-19: qty 5000

## 2012-03-19 MED ORDER — LISINOPRIL 20 MG PO TABS
20.0000 mg | ORAL_TABLET | Freq: Every day | ORAL | Status: DC
  Filled 2012-03-19 (×2): qty 1

## 2012-03-19 MED ORDER — ACETAMINOPHEN 325 MG PO TABS: 650.0000 mg | ORAL_TABLET | ORAL | Status: DC | PRN

## 2012-03-19 MED ORDER — FLUTICASONE PROPIONATE 50 MCG/ACT NA SUSP
2.0000 | Freq: Every day | NASAL | Status: DC
  Administered 2012-03-19 – 2012-03-24 (×4): 2 via NASAL
  Filled 2012-03-19: qty 16

## 2012-03-19 MED ORDER — ONDANSETRON HCL 4 MG/2ML IJ SOLN: 4.0000 mg | Freq: Four times a day (QID) | INTRAMUSCULAR | Status: DC | PRN

## 2012-03-19 NOTE — H&P (Addendum)
Office Visit     Patient: Eric [name] Provider: Daneen Schick, MD  DOB: [birth year] Age: 61 Y Sex: Male Date: 03/18/2012  Phone: [phone]   Address: [address] [place], [place]  Pcp: Glendale Chard, MD        Subjective:     CC:    1. f/u per Dr Tamala Julian. 2. he is supposed to have a sleep study through Dr Baird Cancer. 3. lm for main lab to add TSH to 03/15/12 lab draw dx 427.9.        HPI:  General:  Since diagnosed with CHF 6 weeks ago, medical therapy has helped dyspnea but response has been less than complete with persistent fatigue and weakness.Lower extremity swelling has recurred but no increase in weight. Has not had syncope but notices palpitations since decreasing the carvedilol dose to 12.5 mg BID last week. Denies chest pain. He has lower extremity swelling. No chest pain, orthopnea, or PND. C/O abdominal bloating and difficulty with bowels..        ROS:  CONSTITUTIONAL:  Patient denies chills, fatigue, fever, insomnia, night sweats, and anorexia. Patient complains of poor appetite.  CARDIOLOGY:  no Chest tightness. no Claudication. no Cyanosis. no Dyspnea on exertion. Edema yes. no Fatigue. no Irregular heart beat. no Murmurs. no Near Syncope. no Orthopnea. no Palpitations. no PND (paroxsymal nocturnal dyspnea). Signs of GI bleeding None. Snoring and/or insomnia None. Syncope no. Transient neurological symptoms None. Visual changes none.  GASTROENTEROLOGY:  Patient denies acid reflux, black stools, blood in stool, diarrhea, clay colored stools, dysphagia. Patient complains of alternating constipation and diarrhea. Bloating also a problem.Marland Kitchen  PSYCHOLOGY:  Anxiety yes.         Medical History: Hypertension, Hypelipidemia, Obesity, Cardiomyopathy, possibly hypertensive. EF 25%.        Surgical History: Skin excision, Melanocytoma 2012, Finger fracture repair 0000000, CHF, systolic heart failure 0000000.        Family History: Father: deceased 74 yrs 25 and heart  Mother: deceased 31 yrs DM and Htn        Social History:  General:  History of smoking  cigarettes: Never smoked Smoking: no.  Alcohol: no.  no Diet.  Occupation: employed, Chief Strategy Officer.  Education: yes.  Marital Status: married.  Children: 1, son (s).        Medications: Vitamin D 2000 UNIT Tablet 1 tablet with a meal Once a day, OTC actiflex as needed as directed , Supplement: azelastine and flonase as needed as directed , Temazepam 15 MG Capsule 1 capsule at bedtime Once a day, Mag-Ox 400 400 MG Tablet 1 tablet daily, Furosemide 40 mg Tablet 2 tablets Once a day, Spironolactone 25 MG Tablet 1 tablet twice a day, K-Dur 20 meq Tablet 1 tablet daily, Lisinopril 40 MG Tablet 1 tablet Once a day, Aspir-81 81 MG Tablet Delayed Release 2 tablets Once a day, Carvedilol 25 MG Tablet 1/2 tablet Twice a day, Medication List reviewed and reconciled with the patient       Allergies: N.K.D.A.      Objective:     Vitals: Wt 234, Wt change 1.4 lb, Ht 68, BMI 35.58, Pulse sitting 108, BP sitting 94/74.       Examination:  Cardiology Exam:  GENERAL APPEARANCE: pleasant, NAD, comfortable, male, middle aged . Looks fatigued.Marland Kitchen  HEENT: normal.  CAROTID UPSTROKE: no bruit, upstrokes intact.  JVD: elevated, bilateral, mild.  HEART: Tachycardic irregular rate and rhythm, normal [id number], no rub, or click. S3 Gallop is present.Marland Kitchen  HEART MURMUR: no  obvious murmur on todays exam.  LUNGS: clear to auscultation, no wheezing/rhonchi/rales.  ABDOMEN: soft, non-tender, no hepatomegaly, no masses palpated.  EXTREMITIES: no leg edema.  PERIPHERAL PULSES: 2+, bilateral, bounding bilaterally.  NEUROLOGIC: grossly intact, cranial nerves intact, gait WNL.  MOOD: normal.  Echocardiogram:  01/2012:Physician Review Conclusions: 1. Moderate left ventricular dilatation. 2. Left ventricular ejection fraction estimated by 2D at 30-35 percent. 3. There is moderate concentric left ventricle hypertrophy. 4. Analysis  of mitral valve inflow, pulmonary vein Doppler and tissue Doppler suggests grade II diastolic dysfunction with elevated left atrial pressure. 5. There is moderate aortic root dilatation. 6. Moderate aortic valve regurgitation. 7. Mild mitral valve regurgitation. 8. Moderate left atrial enlargement. 9. Mild pulmonic valve regurgitation. 10. There is mild tricuspid regurgitation. 11. Mildly elevated estimated right ventricular systolic pressure.       Assessment:     Assessment:  1. Atrial fibrillation - 427.31 (Primary), new  2. Chronic systolic heart failure - 0000000, class 2-3 and aggravated by atrial fib  3. Aortic regurgitation - 424.1, mild to moderate  4. Essential hypertension - 401.9, low normal.  5. LBBB (left bundle branch block) - 426.3    Plan:     1. Atrial fibrillation Increase Carvedilol Tablet, 25 MG, 1 tablet with food, Orally, Twice a day .  (Late diagnosis of a. fib made after the patient was discharged from the clinic. The a fib is contributing to his symptoms and accounts for recurrent worsening of CHF). Needs admission and control of a fib. with ultimate conversion. Needs IV heparin and coumadin. Will likely need Amiodarone.       2. Chronic systolic heart failure Decrease Lisinopril Tablet, 40 MG, 1/2 tablet, Orally, Once a day ; Continue K-Dur Tablet, 20 meq, 1 tablet, Orally, daily ; Continue Spironolactone Tablet, 25 MG, 1 tablet, Orally, twice a day ; Increase Furosemide Tablet, 40 MG, 2 tablets, Orally, twice a day .  Diagnostic Imaging:EKG A fib with RVR, Boehler,Eileen 03/18/2012 01:52:33 PM > Larene Ascencio 03/18/2012 08:43:18 PM >  Will consider EP evaluation for resynchronization hterapy.       3. Others Continue Mag-Ox 400 Tablet, 400 MG, 1 tablet, Orally, daily ; Continue Temazepam Capsule, 15 MG, 1 capsule at bedtime, Orally, Once a day ; Continue Supplement:, azelastine and flonase as needed, as directed ; Continue Vitamin D Tablet, 2000 UNIT, 1 tablet  with a meal, Orally, Once a day ; Continue OTC, actiflex as needed, as directed .        Immunizations:        Labs:        Procedure Codes: 93000 EKG I AND R       Preventive:         Follow Up: prn, after hospital stay (Reason: CHF)      Provider: Daneen Schick, MD  Patient: Eric [name] DOB: [birth year] Date: 03/18/2012    Update history and physical, 03/19/2012:  The patient preferred to be admitted to the hospital today rather than last night. Overnight he has felt somewhat weak but was able to work most of the day. Fatigue and lower extremity swelling or somewhat better. Heart rate is slower. He is being admitted to the hospital to initiate anticoagulation therapy, load with amiodarone, and potentially undergo electrical cardioversion. Also consider an electrophysiology consultation forgot is concerning AICD/resynchronization therapy.

## 2012-03-19 NOTE — Progress Notes (Signed)
ANTICOAGULATION CONSULT NOTE - Initial Consult  Pharmacy Consult for Heparin, Coumadin Indication: atrial fibrillation  No Known Allergies  Patient Measurements: Weight: 231 lb 1.6 oz (104.826 kg) Heparin Dosing Weight: 94.7 kg  Vital Signs: BP: 100/78 mmHg (06/25 1824) Pulse Rate: 129  (06/25 1824)  Labs: No results found for this basename: HGB:2,HCT:3,PLT:3,APTT:3,LABPROT:3,INR:3,HEPARINUNFRC:3,CREATININE:3,CKTOTAL:3,CKMB:3,TROPONINI:3 in the last 72 hours  The CrCl is unknown because both a height and weight (above a minimum accepted value) are required for this calculation.   Medical History: Past Medical History  Diagnosis Date  . Claustrophobia   . Heart murmur   . CHF (congestive heart failure)   . Hypertension   . Varicose vein of leg     right  . Dysrhythmia     "palpitations"  . Exertional dyspnea 01/2012  . Shortness of breath 01/2012  . Migraine 02/13/12    "opthalmic"  . Chronic lower back pain     Medications:  Scheduled:    . amiodarone  400 mg Oral BID  . aspirin EC  81 mg Oral BID  . azelastine  2 spray Each Nare Daily  . carvedilol  25 mg Oral BID WC  . coumadin book   Does not apply Once  . fluticasone  2 spray Each Nare Daily  . furosemide  40 mg Intravenous Q12H  . heparin  5,000 Units Intravenous Once  . lisinopril  20 mg Oral Daily  . potassium chloride  20 mEq Oral BID  . sodium chloride  3 mL Intravenous Q12H  . spironolactone  25 mg Oral BID  . warfarin  7.5 mg Oral Once  . warfarin   Does not apply Once  . Warfarin - Pharmacist Dosing Inpatient   Does not apply q1800  . DISCONTD: aspirin  81 mg Oral BID    Assessment: 61 yr old male with new onset a.fib. He was recently diagnosed with chronic systolic heart failure. A.fib was discovered during an office visit with his cardiologist.  Goal of Therapy:  Heparin level 0.3-0.7 units/ml, INR 2-3 Monitor platelets by anticoagulation protocol: Yes   Plan:  Will start heparin with  5000 unit bolus and 1350 units/hr. Will check heparin level and CBC in 6 hrs, then daily while on heparin. Coumadin 7.5 mg tonight. Daily INR. Ordered coumadin booklet and video for pt.   Minta Balsam 03/19/2012,8:16 PM

## 2012-03-20 ENCOUNTER — Encounter (HOSPITAL_COMMUNITY): Payer: Self-pay | Admitting: *Deleted

## 2012-03-20 DIAGNOSIS — I4892 Unspecified atrial flutter: Secondary | ICD-10-CM

## 2012-03-20 LAB — CBC
HCT: 38.7 % — ABNORMAL LOW (ref 39.0–52.0)
Hemoglobin: 13.5 g/dL (ref 13.0–17.0)
MCH: 30.5 pg (ref 26.0–34.0)
MCHC: 34.9 g/dL (ref 30.0–36.0)
RBC: 4.42 MIL/uL (ref 4.22–5.81)

## 2012-03-20 LAB — BASIC METABOLIC PANEL
BUN: 40 mg/dL — ABNORMAL HIGH (ref 6–23)
Chloride: 104 mEq/L (ref 96–112)
GFR calc Af Amer: 43 mL/min — ABNORMAL LOW (ref >90)
GFR calc non Af Amer: 37 mL/min — ABNORMAL LOW (ref >90)
Glucose, Bld: 120 mg/dL — ABNORMAL HIGH (ref 70–99)
Potassium: 3.8 mEq/L (ref 3.5–5.1)
Sodium: 136 mEq/L (ref 135–145)

## 2012-03-20 LAB — PRO B NATRIURETIC PEPTIDE: Pro B Natriuretic peptide (BNP): 8346 pg/mL — ABNORMAL HIGH (ref 0–125)

## 2012-03-20 LAB — PROTIME-INR
INR: 1.35 (ref 0.00–1.49)
Prothrombin Time: 16.9 seconds — ABNORMAL HIGH (ref 11.6–15.2)

## 2012-03-20 MED ORDER — APIXABAN 5 MG PO TABS
5.0000 mg | ORAL_TABLET | Freq: Two times a day (BID) | ORAL | Status: DC
  Administered 2012-03-20 – 2012-03-24 (×8): 5 mg via ORAL
  Filled 2012-03-20 (×14): qty 1

## 2012-03-20 MED ORDER — AMIODARONE HCL 200 MG PO TABS
400.0000 mg | ORAL_TABLET | Freq: Two times a day (BID) | ORAL | Status: DC
  Administered 2012-03-20 – 2012-03-24 (×8): 400 mg via ORAL
  Filled 2012-03-20 (×9): qty 2

## 2012-03-20 MED ORDER — SODIUM CHLORIDE 0.9 % IV SOLN
INTRAVENOUS | Status: AC
  Administered 2012-03-20: 21:00:00 via INTRAVENOUS

## 2012-03-20 MED ORDER — HEPARIN (PORCINE) IN NACL 100-0.45 UNIT/ML-% IJ SOLN
1250.0000 [IU]/h | INTRAMUSCULAR | Status: AC
  Filled 2012-03-20: qty 250

## 2012-03-20 MED ORDER — WARFARIN SODIUM 7.5 MG PO TABS
7.5000 mg | ORAL_TABLET | Freq: Once | ORAL | Status: DC
  Filled 2012-03-20: qty 1

## 2012-03-20 MED ORDER — ISOSORB DINITRATE-HYDRALAZINE 20-37.5 MG PO TABS
1.0000 | ORAL_TABLET | Freq: Two times a day (BID) | ORAL | Status: DC
  Administered 2012-03-20 – 2012-03-24 (×5): 1 via ORAL
  Filled 2012-03-20 (×10): qty 1

## 2012-03-20 NOTE — Consult Note (Addendum)
ELECTROPHYSIOLOGY CONSULT NOTE  Patient ID: Daniell Tinder MRN: [id number], DOB/AGE: [birth year]   Admit date: 03/19/2012 Date of Consult: 03/20/2012  Primary Physician: Glendale Chard, MD Primary Cardiologist: Daneen Schick, MD Reason for Consultation: Atrial fibrillation/atrial flutter  History of Present Illness Dr. Ponzi is a pleasant 61 year old gentleman with a recently diagnosed cardiomyopathy, EF 30-35%, and long standing HTN who has been admitted with new onset atrial fibrillation and worsening CHF. [name] reports persistent fatigue and weakness since his diagnosis of CHF/CM in April 2013. His heart failure medication regimen is being adjusted/optimized currently. He has been on carvedilol and lisinopril in the past. 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 He was last hospitalized with decompensated CHF in mid-May. He reports intermittent palpitations x 7 years. He was evaluated by a cardiologist in [place] at that time but does not recall any specific diagnosis. He was started on diltiazem which helped. He states he has not had any further trouble with palpitations until March of this year. He describes an unusual sensation in his chest with occasional fluttering. He denies chest pain. His SOB has improved with diuresis and initiation of his heart failure regimen. He has pedal edema, ongoing x 3 weeks. He denies orthopnea or PND. He reports compliance with his medications.  Past Medical History  Diagnosis Date  . Claustrophobia   . Heart murmur   . CHF (congestive heart failure)   . Hypertension   . Varicose vein of leg     right  . Dysrhythmia     "palpitations"  . Exertional dyspnea 01/2012  . Shortness of breath 01/2012  . Migraine 02/13/12    "opthalmic"  . Chronic lower back pain     Past Surgical History  Procedure Date  .  Skin melanocytoma excision 2012    "above left clavicle"  . Finger surgery 2012    "4th digit right hand; thumb on left hand"    Allergies/Intolerances Allergen Reactions  . Lactose Intolerance (Gi) Diarrhea   Inpatient Medications . amiodarone  400 mg Oral BID  . aspirin EC  81 mg Oral BID  . azelastine  2 spray Each Nare Daily  . carvedilol  25 mg Oral BID WC  . fluticasone  2 spray Each Nare Daily  . heparin  5,000 Units Intravenous Once  . isosorbide-hydrALAZINE  1 tablet Oral BID  . sodium chloride  3 mL Intravenous Q12H  . spironolactone  25 mg Oral BID  . warfarin  7.5 mg Oral Once   . heparin 1,350 Units/hr (03/19/12 2217)   Family History Problem Relation Age of Onset  . Hypertension, CAD, heart failure, diabetes Mother   . Heart failure Father     Social History Social History  . Marital Status: Married    Spouse Name: [name]    Number of Children: 1  . Years of Education: N/A   Social History Main Topics  . Smoking status: Never Smoker   . Smokeless tobacco: Never Used  . Alcohol Use: Yes     02/13/12 "socially"  . Drug Use: No  . 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 He has one son.   Review of Systems General:  No chills, fever, night sweats or weight changes  Cardiovascular:  No chest pain Dermatological: No rash, lesions or masses  Respiratory: No cough Urologic: No hematuria, dysuria Abdominal:   No nausea, vomiting, diarrhea, bright red blood per rectum, melena, or hematemesis Neurologic:  No visual changes, weakness, changes in mental status All other systems reviewed and are otherwise negative except as noted above.  Physical Exam Vitals: Blood pressure 78/50, pulse 82, temperature 97.6 F (36.4 C), resp. rate 18, weight 230 lb 8 oz (104.554 kg), SpO2 97%.  General: Well developed, well appearing 61 year old male in no acute distress. HEENT: Normocephalic,  atraumatic. EOMs intact. Sclera nonicteric. Oropharynx clear.  Neck: Supple without bruits. +JVD. Lungs: Respirations regular and unlabored, CTA bilaterally. No wheezes, rales or rhonchi. Heart: RRR. S1, S2 present. No murmurs, rub, S3 or S4. Abdomen: Soft, non-distended.  Extremities: 2+ pedal edema bilaterally. No clubbing or cyanosis. DP/PT/Radials 2+ and equal bilaterally. Psych: Normal affect. Neuro: Alert and oriented X 3. Moves all extremities spontaneously. No focal deficits.   Labs Component Value Date   WBC 9.0 03/20/2012   HGB 13.5 03/20/2012   HCT 38.7* 03/20/2012   MCV 87.6 03/20/2012   PLT 337 03/20/2012    Lab 03/20/12 0343  NA 136  K 3.8  CL 104  CO2 21  BUN 40*  CREATININE 1.89*  CALCIUM 8.3*  PROT --  BILITOT --  ALKPHOS --  ALT --  AST --  GLUCOSE 120*   No components found with this basename: MAGNESIUM No components found with this basename: POCBNP:3 No results found for this basename: TSH,T4TOTAL,FREET3,T3FREE,THYROIDAB in the last 72 hours   Basename 03/20/12 0343  INR 1.35    Radiology/Studies 03/19/2012  *RADIOLOGY REPORT*  Clinical Data: 61 year old male with congestive heart failure.  CHEST - 2 VIEW  Comparison: 02/14/2012 and earlier.  Findings: Increased right pleural effusion, now moderate. Pulmonary vascular congestion has resolved and there is no edema. Small left pleural effusion has resolved. Stable cardiomegaly and mediastinal contours.  No pneumothorax. Visualized tracheal air column is within normal limits.  No acute osseous abnormality identified.  IMPRESSION: 1.  Increased right pleural effusion, moderate with lung base atelectasis. 2.  At the same time pulmonary edema and small left effusion have resolved. 3.  Stable cardiomegaly.  Original Report Authenticated By: Randall An, M.D.   Echocardiogram from May 2013  Moderate left ventricular dilatation. Left ventricular ejection fraction estimated at 30-35%. There is moderate concentric  left ventricle hypertrophy. Analysis of mitral valve inflow, pulmonary vein Doppler and tissue Doppler suggests grade II diastolic dysfunction with elevated left atrial pressure. There is moderate aortic root dilatation. Moderate aortic valve regurgitation. Mild mitral valve regurgitation. Moderate left atrial enlargement. Mild pulmonic valve regurgitation. There is mild tricuspid regurgitation. Mildly elevated estimated right ventricular systolic pressure.  12-lead ECG this AM shows typical atrial flutter that is rate controlled with variable AV conduction at 88 bpm, LBBB  Telemetry shows rate controlled atrial flutter  Assessment and Plan 1. Atrial flutter - currently rate controlled; recommend EP study with RF ablation of atrial flutter for potentially curative treatment; for now, continue beta blocker for rate control; ? need for amiodarone short term; his CHADS2VASc score is 2 indicating he is at high risk for cardioembolic event, approximately 2.2% per year so agree with initiating chronic anticoagulation  The patient was seen, examined and plan of care formulated with Dr. Virl Axe.  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    QTc too long for class III AAD so amio is our only option; the alternative is catheter ablation..  I have discussed with JA and with schedule clog, the following option has presented itself 1) begin Eliquis 2) TEE-DCCV Fri/Mon 3) 30 day event recorder to see if atrial fibrillation is evident, as that would make isthmus ablation unappealing and suggest PVI is the better choice 4) continue heart failure therapies 5) if LV function does not improve, consider CRT at a later date \  Virl Axe, MD 03/20/2012 3:29 PM

## 2012-03-20 NOTE — Progress Notes (Signed)
Patient's BP is 74/50 manually and 73/52 automatically, MD on call notified and orders given to hold aldactone; will continue to monitor patient. There has been a note added in the physician's sticky notes, patient's BP medications may need to be reviewed per MD on call____________________________________________D. Owens Shark RN

## 2012-03-20 NOTE — Progress Notes (Signed)
Pt. Had a 4 beat run of V-tach. Patient sitting up in chair. Patient asymptomatic and states that he feels fine. BP: 82/59, HR: 86.  MD on call notified of both v-tach and BP.  No new orders given. Will continue to monitor.

## 2012-03-20 NOTE — Progress Notes (Signed)
ANTICOAGULATION CONSULT NOTE - Follow Up Consult  Pharmacy Consult for heparin Indication: atrial fibrillation  Labs:  [facility] 03/20/12 0343 03/19/12 2039  HGB 13.5 14.1  HCT 38.7* 41.5  PLT 337 358  APTT -- --  LABPROT 16.9* 15.7*  INR 1.35 1.22  HEPARINUNFRC 0.47 --  CREATININE -- 1.95*  CKTOTAL -- --  CKMB -- --  TROPONINI -- --    Assessment/Plan: 61yo male therapeutic on heparin with initial dosing for new Afib.  Will continue gtt at current rate and confirm stable with additional level.  Rogue Bussing PharmD BCPS 03/20/2012,4:23 AM

## 2012-03-20 NOTE — Progress Notes (Signed)
Patient's BP is 73/43 automatic and 78/50 manually, MD on call paged twice; MD gave orders to recheck BP, no other orders given at this time; will continue to monitor patient_____________________________________D. Owens Shark RN

## 2012-03-20 NOTE — Progress Notes (Signed)
Pt had a 6 beat run of v-tach. Patient asymptomatic. Pt sitting in chair talking with wife. BP: 98/64, HR: 84.  MD on call notified. No new orders given. Will continue to monitor.

## 2012-03-20 NOTE — Progress Notes (Signed)
ANTICOAGULATION CONSULT NOTE - Initial Consult  Pharmacy Consult for Heparin, Coumadin Indication: atrial fibrillation  Allergies  Allergen Reactions  . Lactose Intolerance (Gi) Diarrhea    Patient Measurements: Weight: 230 lb 8 oz (104.554 kg) (b scale) Heparin Dosing Weight: 94.7 kg  Vital Signs: Temp: 97.6 F (36.4 C) (06/26 0947) Temp src: Oral (06/26 0947) BP: 83/49 mmHg (06/26 1117) Pulse Rate: 82  (06/26 0947)  Labs:  Basename 03/20/12 1137 03/20/12 0343 03/19/12 2039  HGB -- 13.5 14.1  HCT -- 38.7* 41.5  PLT -- 337 358  APTT -- -- --  LABPROT -- 16.9* 15.7*  INR -- 1.35 1.22  HEPARINUNFRC 0.71* 0.47 --  CREATININE -- 1.89* 1.95*  CKTOTAL -- -- --  CKMB -- -- --  TROPONINI -- -- --    The CrCl is unknown because both a height and weight (above a minimum accepted value) are required for this calculation.   Medical History: Past Medical History  Diagnosis Date  . Claustrophobia   . Heart murmur   . CHF (congestive heart failure)   . Hypertension   . Varicose vein of leg     right  . Dysrhythmia     "palpitations"  . Exertional dyspnea 01/2012  . Shortness of breath 01/2012  . Migraine 02/13/12    "opthalmic"  . Chronic lower back pain     Medications:  Scheduled:     . amiodarone  400 mg Oral BID  . aspirin EC  81 mg Oral BID  . azelastine  2 spray Each Nare Daily  . carvedilol  25 mg Oral BID WC  . coumadin book   Does not apply Once  . fluticasone  2 spray Each Nare Daily  . heparin  5,000 Units Intravenous Once  . isosorbide-hydrALAZINE  1 tablet Oral BID  . sodium chloride  3 mL Intravenous Q12H  . spironolactone  25 mg Oral BID  . warfarin  7.5 mg Oral Once  . warfarin   Does not apply Once  . Warfarin - Pharmacist Dosing Inpatient   Does not apply q1800  . DISCONTD: aspirin  81 mg Oral BID  . DISCONTD: furosemide  40 mg Intravenous Q12H  . DISCONTD: lisinopril  20 mg Oral Daily  . DISCONTD: potassium chloride  20 mEq Oral BID     Assessment: 61 yr old male with new onset a.fib. CHADS2=2.  He was recently diagnosed with chronic systolic heart failure. A.fib was discovered during an office visit with his cardiologist.  Heparin level slightly supratherapeutic at 0.71 and is day two of overlap with Coumadin.  No bleeding noted.  Goal of Therapy:  Heparin level 0.3-0.7 units/ml, INR 2-3 Monitor platelets by anticoagulation protocol: Yes   Plan:  - Decrease heparin to 1250 units/hr - Repeat Coumadin 7.5 mg po tonight - Continue daily PT/INR, heparin level   Edwyna Shell, Pharm.D., BCPS Clinical Pharmacist Pager: [phone] 03/20/2012,2:01 PM

## 2012-03-20 NOTE — Progress Notes (Signed)
Patient Name: Eric [name] Date of Encounter: 03/20/2012    SUBJECTIVE: No chest pain or dyspnea.  TELEMETRY:  A fib with RVR: Filed Vitals:   03/19/12 2050 03/20/12 0214 03/20/12 0226 03/20/12 0552  BP: 117/71 82/59 98/62  102/66  Pulse: 110 86 86 95  Temp: 97.9 F (36.6 C)  98.4 F (36.9 C) 99 F (37.2 C)  TempSrc: Axillary  Oral Oral  Resp: 20  20 18   Weight:    104.554 kg (230 lb 8 oz)  SpO2: 100%  96% 96%    Intake/Output Summary (Last 24 hours) at 03/20/12 0752 Last data filed at 03/20/12 0657  Gross per 24 hour  Intake    244 ml  Output   1100 ml  Net   -856 ml    LABS: Basic Metabolic Panel:  Basename 03/20/12 0343 03/19/12 2039  NA 136 136  K 3.8 4.2  CL 104 102  CO2 21 22  GLUCOSE 120* 144*  BUN 40* 39*  CREATININE 1.89* 1.95*  CALCIUM 8.3* 8.8  MG -- --  PHOS -- --   CBC:  Basename 03/20/12 0343 03/19/12 2039  WBC 9.0 8.1  NEUTROABS -- 5.6  HGB 13.5 14.1  HCT 38.7* 41.5  MCV 87.6 89.4  PLT 337 358   Radiology/Studies:  CHEST - 2 VIEW  Comparison: 02/14/2012 and earlier.  Findings: Increased right pleural effusion, now moderate.  Pulmonary vascular congestion has resolved and there is no edema.  Small left pleural effusion has resolved. Stable cardiomegaly and  mediastinal contours. No pneumothorax. Visualized tracheal air  column is within normal limits. No acute osseous abnormality  identified.  IMPRESSION:  1. Increased right pleural effusion, moderate with lung base  atelectasis.  2. At the same time pulmonary edema and small left effusion have  resolved.  3. Stable cardiomegaly.  Original Report Authenticated By: Randall An, M.D.   Physical Exam: Blood pressure 102/66, pulse 95, temperature 99 F (37.2 C), temperature source Oral, resp. rate 18, weight 104.554 kg (230 lb 8 oz), SpO2 96.00%. Weight change:    S3 gallop.  2+ edema bilat  Decreased skin turgor  Mod JVD  ASSESSMENT:  1. Systolic heart failure, class 3,  EF < 40%  2. A flutter/Fib aggravating therapy, may suggest component of tachy induced CHF.  3. Hypertension, resolve  4. Acute on chronic kidney failure, with acute due to diuresis  Plan:  1. Hold/D/c ACE due to renal and start Bidil 2. EP consult, spoke to Dr. Caryl Comes 3. Consider Pradaxa rather than coumadin. 4. Poor prognostic indicators  Signed, Sinclair Grooms 03/20/2012, 7:52 AM

## 2012-03-20 NOTE — Progress Notes (Signed)
UR Completed. Llana Aliment, RN, Nurse Case Manager [phone]

## 2012-03-21 LAB — BASIC METABOLIC PANEL
BUN: 34 mg/dL — ABNORMAL HIGH (ref 6–23)
CO2: 20 mEq/L (ref 19–32)
Calcium: 8.4 mg/dL (ref 8.4–10.5)
Chloride: 105 mEq/L (ref 96–112)
Creatinine, Ser: 1.8 mg/dL — ABNORMAL HIGH (ref 0.50–1.35)
Glucose, Bld: 100 mg/dL — ABNORMAL HIGH (ref 70–99)

## 2012-03-21 LAB — CBC
HCT: 36.3 % — ABNORMAL LOW (ref 39.0–52.0)
MCH: 30.3 pg (ref 26.0–34.0)
MCV: 87.9 fL (ref 78.0–100.0)
Platelets: 341 10*3/uL (ref 150–400)
RDW: 14.7 % (ref 11.5–15.5)
WBC: 10.4 10*3/uL (ref 4.0–10.5)

## 2012-03-21 MED ORDER — TEMAZEPAM 15 MG PO CAPS
15.0000 mg | ORAL_CAPSULE | Freq: Every evening | ORAL | Status: DC | PRN
  Administered 2012-03-21 – 2012-03-23 (×3): 15 mg via ORAL
  Filled 2012-03-21 (×3): qty 1

## 2012-03-21 NOTE — Progress Notes (Signed)
Patient Name: Eric [name] Date of Encounter: 03/21/2012    SUBJECTIVE:He feels weak when the BP is low. He denies chest pain.  TELEMETRY:  Atrial flutter with poor rate control.: Filed Vitals:   03/20/12 2317 03/21/12 0236 03/21/12 0607 03/21/12 0827  BP: 104/77 98/64 93/64  105/67  Pulse: 104 96 94   Temp:  99 F (37.2 C) 98.9 F (37.2 C)   TempSrc:  Oral Oral   Resp:  18 20   Height:      Weight:   106.051 kg (233 lb 12.8 oz)   SpO2:  100% 95%     Intake/Output Summary (Last 24 hours) at 03/21/12 1028 Last data filed at 03/21/12 0956  Gross per 24 hour  Intake 1124.29 ml  Output    950 ml  Net 174.29 ml    LABS: Basic Metabolic Panel:  Basename 03/21/12 0530 03/20/12 0343  NA 137 136  K 3.6 3.8  CL 105 104  CO2 20 21  GLUCOSE 100* 120*  BUN 34* 40*  CREATININE 1.80* 1.89*  CALCIUM 8.4 8.3*  MG -- --  PHOS -- --   CBC:  Basename 03/21/12 0530 03/20/12 0343 03/19/12 2039  WBC 10.4 9.0 --  NEUTROABS -- -- 5.6  HGB 12.5* 13.5 --  HCT 36.3* 38.7* --  MCV 87.9 87.6 --  PLT 341 337 --    Radiology/Studies:  No new.  Physical Exam: Blood pressure 105/67, pulse 94, temperature 98.9 F (37.2 C), temperature source Oral, resp. rate 20, height 5' 9.5" (1.765 m), weight 106.051 kg (233 lb 12.8 oz), SpO2 95.00%. Weight change: 1.225 kg (2 lb 11.2 oz)    S3 gallop/summation. Decreased right basilar sounds. JVD  ASSESSMENT:  1. Systolic heart failure, presumed due to non ischemic mechanism. No cath yet due to renal impairment. Class 3 2. Hypotension related to diuresis, atrial flutter, and med regimen. 3. Acute on chronic kidney injury improving while holding diuretics and ACE. 4. Atrial flutter , now on amiodarone and planning TEE cardioversion tomorrow.   Plan:  1. Plan to TEE Cardiovert tomorrow if can schedule and if Dr. Marlou Porch feels it is appropriate. 2. Continue to hold diuretics, although need to resume soon, perhaps after we get back in rhythm. 3.  Amio load and will eventually d/c amio 2-3 months after has opportunity to improve. Spoke with Dr. Caryl Comes, and they are unable to cardiovert at this time.  Demetrios Isaacs 03/21/2012, 10:28 AM

## 2012-03-22 ENCOUNTER — Encounter (HOSPITAL_COMMUNITY): Payer: Self-pay | Admitting: Certified Registered"

## 2012-03-22 ENCOUNTER — Encounter (HOSPITAL_COMMUNITY)
Admission: AD | Disposition: A | Discharge status: Home / Self Care | Payer: Self-pay | Source: Ambulatory Visit | Attending: Interventional Cardiology

## 2012-03-22 ENCOUNTER — Encounter (HOSPITAL_COMMUNITY): Payer: Self-pay | Admitting: *Deleted

## 2012-03-22 ENCOUNTER — Inpatient Hospital Stay (HOSPITAL_COMMUNITY): Payer: BC Managed Care – PPO | Admitting: Certified Registered"

## 2012-03-22 HISTORY — PX: TEE WITHOUT CARDIOVERSION: SHX5443

## 2012-03-22 HISTORY — PX: CARDIOVERSION: SHX1299

## 2012-03-22 LAB — COMPREHENSIVE METABOLIC PANEL
ALT: 48 U/L (ref 0–53)
Alkaline Phosphatase: 54 U/L (ref 39–117)
BUN: 31 mg/dL — ABNORMAL HIGH (ref 6–23)
CO2: 17 mEq/L — ABNORMAL LOW (ref 19–32)
Calcium: 8.6 mg/dL (ref 8.4–10.5)
GFR calc Af Amer: 46 mL/min — ABNORMAL LOW (ref >90)
GFR calc non Af Amer: 40 mL/min — ABNORMAL LOW (ref >90)
Glucose, Bld: 126 mg/dL — ABNORMAL HIGH (ref 70–99)
Potassium: 4.1 mEq/L (ref 3.5–5.1)
Sodium: 135 mEq/L (ref 135–145)

## 2012-03-22 LAB — CBC
HCT: 38 % — ABNORMAL LOW (ref 39.0–52.0)
MCH: 31.1 pg (ref 26.0–34.0)
MCV: 88.8 fL (ref 78.0–100.0)
RDW: 15 % (ref 11.5–15.5)
WBC: 10.8 10*3/uL — ABNORMAL HIGH (ref 4.0–10.5)

## 2012-03-22 SURGERY — Surgical Case
Anesthesia: *Unknown

## 2012-03-22 SURGERY — ECHOCARDIOGRAM, TRANSESOPHAGEAL
Anesthesia: Moderate Sedation

## 2012-03-22 MED ORDER — FENTANYL CITRATE 0.05 MG/ML IJ SOLN: 250.0000 ug | Freq: Once | INTRAMUSCULAR | Status: DC

## 2012-03-22 MED ORDER — MIDAZOLAM HCL 10 MG/2ML IJ SOLN: 10.0000 mg | Freq: Once | INTRAMUSCULAR | Status: DC

## 2012-03-22 MED ORDER — SODIUM CHLORIDE 0.9 % IJ SOLN: 3.0000 mL | INTRAMUSCULAR | Status: DC | PRN

## 2012-03-22 MED ORDER — PROPOFOL 10 MG/ML IV BOLUS
INTRAVENOUS | Status: DC | PRN
  Administered 2012-03-22: 70 mg via INTRAVENOUS

## 2012-03-22 MED ORDER — SODIUM CHLORIDE 0.9 % IJ SOLN: 3.0000 mL | Freq: Two times a day (BID) | INTRAMUSCULAR | Status: DC

## 2012-03-22 MED ORDER — FENTANYL CITRATE 0.05 MG/ML IJ SOLN
INTRAMUSCULAR | Status: AC
  Filled 2012-03-22: qty 2

## 2012-03-22 MED ORDER — MIDAZOLAM HCL 10 MG/2ML IJ SOLN
INTRAMUSCULAR | Status: AC
  Filled 2012-03-22: qty 2

## 2012-03-22 MED ORDER — SODIUM CHLORIDE 0.9 % IV SOLN
INTRAVENOUS | Status: DC | PRN
  Administered 2012-03-22: 14:00:00 via INTRAVENOUS

## 2012-03-22 MED ORDER — FENTANYL CITRATE 0.05 MG/ML IJ SOLN
INTRAMUSCULAR | Status: DC | PRN
  Administered 2012-03-22: 25 ug via INTRAVENOUS

## 2012-03-22 MED ORDER — SODIUM CHLORIDE 0.9 % IV SOLN
250.0000 mL | INTRAVENOUS | Status: DC | PRN
  Administered 2012-03-22: 250 mL via INTRAVENOUS

## 2012-03-22 MED ORDER — BENZOCAINE 20 % MT SOLN: 1.0000 "application " | OROMUCOSAL | Status: DC | PRN

## 2012-03-22 MED ORDER — SODIUM CHLORIDE 0.45 % IV SOLN: INTRAVENOUS | Status: DC

## 2012-03-22 MED ORDER — BUTAMBEN-TETRACAINE-BENZOCAINE 2-2-14 % EX AERO
INHALATION_SPRAY | CUTANEOUS | Status: DC | PRN
  Administered 2012-03-22: 2 via TOPICAL

## 2012-03-22 MED ORDER — DIPHENHYDRAMINE HCL 50 MG/ML IJ SOLN
INTRAMUSCULAR | Status: AC
  Filled 2012-03-22: qty 1

## 2012-03-22 MED ORDER — MIDAZOLAM HCL 10 MG/2ML IJ SOLN
INTRAMUSCULAR | Status: DC | PRN
  Administered 2012-03-22 (×2): 1 mg via INTRAVENOUS

## 2012-03-22 NOTE — Transfer of Care (Signed)
Immediate Anesthesia Transfer of Care Note  Patient: Eric [name]  Procedure(s) Performed: Procedure(s) (LRB): TRANSESOPHAGEAL ECHOCARDIOGRAM (TEE) (N/A) CARDIOVERSION (N/A)  Patient Location: Endoscopy Unit  Anesthesia Type: General  Level of Consciousness: oriented, sedated and patient cooperative  Airway & Oxygen Therapy: Patient Spontanous Breathing and Patient connected to nasal cannula oxygen  Post-op Assessment: Report given to PACU RN and Post -op Vital signs reviewed and stable  Post vital signs: Reviewed  Complications: No apparent anesthesia complications

## 2012-03-22 NOTE — Progress Notes (Signed)
     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 He denies CP, SOB or palpitations.   Objective  Physical Exam: Vitals: BP 128/91  Pulse 117  Temp 99 F (37.2 C) (Oral)  Resp 18  Ht 5' 9.5" (1.765 m)  Wt 241 lb 1.6 oz (109.362 kg)  BMI 35.09 kg/m2  SpO2 97% General: Well developed, well appearing 61 year old male in no acute distress. Neck: Supple. + JVD. Lungs: Clear bilaterally to auscultation without wheezes, rales, or rhonchi. Breathing is unlabored. Heart: S1 S2 present without murmurs or rub. + S3. Abdomen: Soft, non-distended. Extremities: No clubbing or cyanosis. 1-2+ pedal edema bilaterally.   Neuro: Alert and oriented X 3. Moves all extremities spontaneously. No focal deficits.  Intake/Output: Intake/Output Summary (Last 24 hours) at 03/22/12 0750 Last data filed at 03/22/12 0629  Gross per 24 hour  Intake   1203 ml  Output    550 ml  Net    653 ml   Inpatient Medications:  . amiodarone  400 mg Oral BID  . apixaban  5 mg Oral Q12H  . aspirin EC  81 mg Oral BID  . azelastine  2 spray Each Nare Daily  . carvedilol  25 mg Oral BID WC  . fluticasone  2 spray Each Nare Daily  . isosorbide-hydralazine  1 tablet Oral BID   Labs: [facility] 03/22/12 0647 03/21/12 0530  NA 135 137  K 4.1 3.6  CL 103 105  CO2 17* 20  GLUCOSE 126* 100*  BUN 31* 34*  CREATININE 1.77* 1.80*  CALCIUM 8.6 8.4  MG -- --  PHOS -- --    Basename 03/22/12 0647  AST 33  ALT 48  ALKPHOS 54  BILITOT 0.7  PROT 5.6*  ALBUMIN 2.7*    Basename 03/22/12 0647 03/21/12 0530 03/19/12 2039  WBC 10.8* 10.4 --  NEUTROABS -- -- 5.6  HGB 13.3 12.5* --  HCT 38.0* 36.3* --  MCV 88.8 87.9 --  PLT 376 341 --    Telemetry: persistent atrial flutter   Assessment and Plan  1. Atrial flutter - awaiting TEE-guided DCCV today 2. Presumed nonischemic cardiomyopathy  3. 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 His QTc is too long for class III AAD so amiodarone is our only option. The alternative is catheter ablation. Dr. Caryl Comes discussed this with Dr. Rayann Heman and recommended the following: 1) begin Eliquis  2) TEE-DCCV Fri/Mon  3) 30 day event recorder to see if atrial fibrillation is evident; if so, that would make isthmus ablation unappealing and suggest PVI is the better choice  4) continue heart failure therapy  5) if LV function does not improve, consider CRT at a later date  Dr. Caryl Comes to see and make further EP recommendations as needed. Signed, Taci Sterling PA-C

## 2012-03-22 NOTE — Interval H&P Note (Signed)
History and Physical Interval Note:  03/22/2012 1:47 PM  Eric [name]  has presented today for surgery, with the diagnosis of a-fib  The various methods of treatment have been discussed with the patient and family. After consideration of risks, benefits and other options for treatment, the patient has consented to  Procedure(s) (LRB): TRANSESOPHAGEAL ECHOCARDIOGRAM (TEE) (N/A) CARDIOVERSION (N/A) as a surgical intervention .  The patient's history has been reviewed, patient examined, no change in status, stable for surgery.  I have reviewed the patients' chart and labs.  Questions were answered to the patient's satisfaction.     [name]  I have discussed TEE/CV with he and his wife this morning. Risks of esophageal perforation, risks of respiratory arrest from sedation, other cardiac complications reviewed. No history of stricture of esophageal trauma. With his EF 30%, increased risk of sedation.   Willing to proceed.

## 2012-03-22 NOTE — Progress Notes (Addendum)
Patient requested medication to help him sleep. He said that he took at medication at home for sleep and that he believed it was temazepam 10 mg that he takes at home. MD on call Dr. Radford Pax was called and notified. Was informed that medication does not come in 10 mg and notified patient while on phone with MD. Patient agreed to temazepam  15 mg to be given. Patient was given temazepam 15 mg po around 2327. Was called to room around 0100 to check on patient. Patient's wife was concerned about patient level of consciousness and breathing. VS were taken and documented and were within normal range for patient's baseline. Oxygen saturation level was 99% to 100% on RA. Pulse rate was WDL. Patient was also responsive to voice and touch and responded appropriately to questions. Told patient that  I would leave note for physician in the am to address dose of temazepam to see if lower dose can be given. Patient continues to be NPO since midnight for cardioversion today. Patient also signed informed consent and is placed in chart. Patient also complained of not having much urinary output around 2000 so bladder scan was performed. Shortly before bladder scan was performed patient had urine output of 125 cc.  Bladder scan was still done and showed no urine remaining in the bladder.

## 2012-03-22 NOTE — Anesthesia Postprocedure Evaluation (Signed)
  Anesthesia Post-op Note  Patient: Eric [name]  Procedure(s) Performed: Procedure(s) (LRB): TRANSESOPHAGEAL ECHOCARDIOGRAM (TEE) (N/A) CARDIOVERSION (N/A)  Patient Location: Endoscopy Unit  Anesthesia Type: General  Level of Consciousness: awake, oriented and patient cooperative  Airway and Oxygen Therapy: Patient Spontanous Breathing and Patient connected to nasal cannula oxygen  Post-op Pain: none  Post-op Assessment: Post-op Vital signs reviewed, Patient's Cardiovascular Status Stable and Respiratory Function Stable  Post-op Vital Signs: Reviewed  Complications: No apparent anesthesia complications

## 2012-03-22 NOTE — CV Procedure (Addendum)
TEE - no LA thrombus, +spontaneous contrast. EF 20%. Trivial MR, mild AI, moderate TR.   Electrical Cardioversion Procedure Note Vinay Russello [id number] Apr 07, 1951  Procedure: Electrical Cardioversion Indications:  AFLUTTER  Time Out: Verified patient identification, verified procedure,medications/allergies/relevent history reviewed, required imaging and test results available. time out performed  Procedure Details  The patient was NPO after midnight. Anesthesia was administered at the beside  by anesthesia with 70mg  of propofol.  Cardioversion was performed with synchronized biphasic defibrillation via AP pads with 150 joules.  1 attempt(s) were performed.  The patient converted to normal sinus rhythm. The patient tolerated the procedure well   IMPRESSION:  Successful cardioversion of atrial flutter.    Philemon Riedesel, Adams 03/22/2012, 2:15 PM   If he maintains sinus rhythm, remains compensated from a heart failure perspective, one could consider discharge either Saturday or Sunday. Appreciate electrophysiology consultation. Please see their note from 03/22/12 for full details. A 30 day event monitor is requested upon discharge. Consider CRT at a later date.

## 2012-03-22 NOTE — H&P (View-Only) (Signed)
Patient Name: Eric [name] Date of Encounter: 03/21/2012    SUBJECTIVE:He feels weak when the BP is low. He denies chest pain.  TELEMETRY:  Atrial flutter with poor rate control.: Filed Vitals:   03/20/12 2317 03/21/12 0236 03/21/12 0607 03/21/12 0827  BP: 104/77 98/64 93/64  105/67  Pulse: 104 96 94   Temp:  99 F (37.2 C) 98.9 F (37.2 C)   TempSrc:  Oral Oral   Resp:  18 20   Height:      Weight:   106.051 kg (233 lb 12.8 oz)   SpO2:  100% 95%     Intake/Output Summary (Last 24 hours) at 03/21/12 1028 Last data filed at 03/21/12 0956  Gross per 24 hour  Intake 1124.29 ml  Output    950 ml  Net 174.29 ml    LABS: Basic Metabolic Panel:  Basename 03/21/12 0530 03/20/12 0343  NA 137 136  K 3.6 3.8  CL 105 104  CO2 20 21  GLUCOSE 100* 120*  BUN 34* 40*  CREATININE 1.80* 1.89*  CALCIUM 8.4 8.3*  MG -- --  PHOS -- --   CBC:  Basename 03/21/12 0530 03/20/12 0343 03/19/12 2039  WBC 10.4 9.0 --  NEUTROABS -- -- 5.6  HGB 12.5* 13.5 --  HCT 36.3* 38.7* --  MCV 87.9 87.6 --  PLT 341 337 --    Radiology/Studies:  No new.  Physical Exam: Blood pressure 105/67, pulse 94, temperature 98.9 F (37.2 C), temperature source Oral, resp. rate 20, height 5' 9.5" (1.765 m), weight 106.051 kg (233 lb 12.8 oz), SpO2 95.00%. Weight change: 1.225 kg (2 lb 11.2 oz)    S3 gallop/summation. Decreased right basilar sounds. JVD  ASSESSMENT:  1. Systolic heart failure, presumed due to non ischemic mechanism. No cath yet due to renal impairment. Class 3 2. Hypotension related to diuresis, atrial flutter, and med regimen. 3. Acute on chronic kidney injury improving while holding diuretics and ACE. 4. Atrial flutter , now on amiodarone and planning TEE cardioversion tomorrow.   Plan:  1. Plan to TEE Cardiovert tomorrow if can schedule and if Dr. Marlou Porch feels it is appropriate. 2. Continue to hold diuretics, although need to resume soon, perhaps after we get back in rhythm. 3.  Amio load and will eventually d/c amio 2-3 months after has opportunity to improve. Spoke with Dr. Caryl Comes, and they are unable to cardiovert at this time.  Demetrios Isaacs 03/21/2012, 10:28 AM

## 2012-03-22 NOTE — Progress Notes (Signed)
  Echocardiogram Echocardiogram Transesophageal has been performed.  Basilia Jumbo 03/22/2012, 2:20 PM

## 2012-03-22 NOTE — Anesthesia Preprocedure Evaluation (Addendum)
Anesthesia Evaluation  Patient identified by MRN, date of birth, ID band Patient awake    Reviewed: Allergy & Precautions, H&P , NPO status , Patient's Chart, lab work & pertinent test results, reviewed documented beta blocker date and time   Airway Mallampati: II  Neck ROM: full    Dental   Pulmonary          Cardiovascular hypertension, Pt. on medications and Pt. on home beta blockers +CHF + dysrhythmias     Neuro/Psych    GI/Hepatic   Endo/Other    Renal/GU      Musculoskeletal   Abdominal   Peds  Hematology   Anesthesia Other Findings   Reproductive/Obstetrics                          Anesthesia Physical Anesthesia Plan  ASA: III  Anesthesia Plan: General   Post-op Pain Management:    Induction:   Airway Management Planned:   Additional Equipment:   Intra-op Plan:   Post-operative Plan:   Informed Consent: I have reviewed the patients History and Physical, chart, labs and discussed the procedure including the risks, benefits and alternatives for the proposed anesthesia with the patient or authorized representative who has indicated his/her understanding and acceptance.     Plan Discussed with: CRNA and Surgeon  Anesthesia Plan Comments:         Anesthesia Quick Evaluation

## 2012-03-22 NOTE — Preoperative (Signed)
Beta Blockers   Reason not to administer Beta Blockers:Not Applicable 

## 2012-03-23 DIAGNOSIS — I5023 Acute on chronic systolic (congestive) heart failure: Secondary | ICD-10-CM

## 2012-03-23 DIAGNOSIS — I4891 Unspecified atrial fibrillation: Secondary | ICD-10-CM

## 2012-03-23 LAB — BASIC METABOLIC PANEL
BUN: 36 mg/dL — ABNORMAL HIGH (ref 6–23)
Chloride: 103 mEq/L (ref 96–112)
Creatinine, Ser: 1.97 mg/dL — ABNORMAL HIGH (ref 0.50–1.35)
Glucose, Bld: 141 mg/dL — ABNORMAL HIGH (ref 70–99)
Potassium: 4.6 mEq/L (ref 3.5–5.1)

## 2012-03-23 LAB — CBC
MCH: 30.3 pg (ref 26.0–34.0)
MCHC: 34.4 g/dL (ref 30.0–36.0)
MCV: 88.2 fL (ref 78.0–100.0)
Platelets: 374 10*3/uL (ref 150–400)
RBC: 4.42 MIL/uL (ref 4.22–5.81)

## 2012-03-23 MED ORDER — CARVEDILOL 12.5 MG PO TABS
12.5000 mg | ORAL_TABLET | Freq: Two times a day (BID) | ORAL | Status: DC
  Administered 2012-03-23 – 2012-03-24 (×2): 12.5 mg via ORAL
  Filled 2012-03-23 (×4): qty 1

## 2012-03-23 NOTE — Progress Notes (Signed)
Spoke with Valeria Batman, PA pt with mild shortness of breath on exertion, bil lower ext edema, lungs clear, b/p 100/69, pt sinus rhythm on tele, and bidil was given, Tony,PA verbalized okay, no new orders at present, states lasix is on hold due the renal function, and low b/p. Will cont to monitor for any changes. No s/s of any acute distress at present.

## 2012-03-23 NOTE — Progress Notes (Signed)
Reviewed plan with patient and his wife.  Will proceed with TEEDCCV and amio  Then we will need to consider RFCA or trial off amio to assess afib burden and to understand whether aflutter RFCA or PVI is the better choice

## 2012-03-23 NOTE — Progress Notes (Signed)
SUBJECTIVE:  Still with LE edema which he thinks is unchanged  OBJECTIVE:   Vitals:   Filed Vitals:   03/22/12 1449 03/22/12 1459 03/22/12 2032 03/23/12 0603  BP: 119/77 119/77 100/69 102/69  Pulse:   74 84  Temp:   98.1 F (36.7 C) 98.1 F (36.7 C)  TempSrc:   Oral Oral  Resp: 19 16 18 18   Height:      Weight:    110 kg (242 lb 8.1 oz)  SpO2: 97% 97% 98% 100%   I&O's:   Intake/Output Summary (Last 24 hours) at 03/23/12 1131 Last data filed at 03/22/12 1817  Gross per 24 hour  Intake    490 ml  Output    125 ml  Net    365 ml   TELEMETRY: Reviewed telemetry pt in NSR with PAC's     PHYSICAL EXAM General: Well developed, well nourished, in no acute distress Lungs:   Clear bilaterally to auscultation and percussion. Heart:   HRRR S1 S2 Pulses are 2+ & equal. Abdomen: Bowel sounds are positive, abdomen soft and non-tender without masses  Extremities:   2-3+ edema bilaterally Neuro: Alert and oriented X 3. Psych:  Good affect, responds appropriately   LABS: Basic Metabolic Panel:  Basename 03/23/12 0611 03/22/12 0647  NA 135 135  K 4.6 4.1  CL 103 103  CO2 17* 17*  GLUCOSE 141* 126*  BUN 36* 31*  CREATININE 1.97* 1.77*  CALCIUM 9.0 8.6  MG -- --  PHOS -- --   Liver Function Tests:  [facility] 03/22/12 0647  AST 33  ALT 48  ALKPHOS 54  BILITOT 0.7  PROT 5.6*  ALBUMIN 2.7*   No results found for this basename: LIPASE:2,AMYLASE:2 in the last 72 hours CBC:  Basename 03/23/12 0611 03/22/12 0647  WBC 10.7* 10.8*  NEUTROABS -- --  HGB 13.4 13.3  HCT 39.0 38.0*  MCV 88.2 88.8  PLT 374 376   Coag Panel:   Lab Results  Component Value Date   INR 1.78* 03/23/2012   INR 1.81* 03/22/2012   INR 1.64* 03/21/2012    RADIOLOGY: Dg Chest 2 View  03/19/2012  *RADIOLOGY REPORT*  Clinical Data: 61 year old male with congestive heart failure.  CHEST - 2 VIEW  Comparison: 02/14/2012 and earlier.  Findings: Increased right pleural effusion, now moderate. Pulmonary  vascular congestion has resolved and there is no edema. Small left pleural effusion has resolved. Stable cardiomegaly and mediastinal contours.  No pneumothorax. Visualized tracheal air column is within normal limits.  No acute osseous abnormality identified.  IMPRESSION: 1.  Increased right pleural effusion, moderate with lung base atelectasis. 2.  At the same time pulmonary edema and small left effusion have resolved. 3.  Stable cardiomegaly.  Original Report Authenticated By: Randall An, M.D.      ASSESSMENT:  1.  Acute systolic heart failure felt secondary to atrial flutter 2.  Hypotension secondary to diuresis - BP still borderline 3.  Acute on chronic kidney injury with slightly increased creatinine today - diuretics/Ace I on hold 4.  Atrial flutter s/p TEE/cardioversion maintaining NSR on Amiodarone load and systemic anticoagulation with Apixaban   PLAN:   1.  Continue to hold diuretics today due to worsening renal function and borderline hypotension 2.  Elevated legs 3.  Recheck BMET in am 4.  Decrease Carvedilol to 12.5mg  BID due to borderline low BP.    Sueanne Margarita, MD  03/23/2012  11:31 AM

## 2012-03-24 LAB — BASIC METABOLIC PANEL
BUN: 36 mg/dL — ABNORMAL HIGH (ref 6–23)
Calcium: 9 mg/dL (ref 8.4–10.5)
Creatinine, Ser: 1.96 mg/dL — ABNORMAL HIGH (ref 0.50–1.35)
GFR calc non Af Amer: 35 mL/min — ABNORMAL LOW (ref >90)
Glucose, Bld: 97 mg/dL (ref 70–99)

## 2012-03-24 LAB — CBC
HCT: 37.1 % — ABNORMAL LOW (ref 39.0–52.0)
Hemoglobin: 12.7 g/dL — ABNORMAL LOW (ref 13.0–17.0)
MCH: 30.2 pg (ref 26.0–34.0)
MCHC: 34.2 g/dL (ref 30.0–36.0)
RDW: 14.8 % (ref 11.5–15.5)

## 2012-03-24 MED ORDER — APIXABAN 5 MG PO TABS: 5.0000 mg | ORAL_TABLET | Freq: Two times a day (BID) | ORAL | Status: DC

## 2012-03-24 MED ORDER — FUROSEMIDE 40 MG PO TABS: 40.0000 mg | ORAL_TABLET | Freq: Every day | ORAL | Status: DC

## 2012-03-24 MED ORDER — ISOSORB DINITRATE-HYDRALAZINE 20-37.5 MG PO TABS: 1.0000 | ORAL_TABLET | Freq: Two times a day (BID) | ORAL | Status: DC

## 2012-03-24 MED ORDER — FUROSEMIDE 40 MG PO TABS
40.0000 mg | ORAL_TABLET | Freq: Every day | ORAL | Status: DC
  Administered 2012-03-24: 40 mg via ORAL
  Filled 2012-03-24: qty 1

## 2012-03-24 MED ORDER — AMIODARONE HCL 200 MG PO TABS: 200.0000 mg | ORAL_TABLET | Freq: Every day | ORAL | Status: DC

## 2012-03-24 MED ORDER — ASPIRIN 81 MG PO TBEC: 81.0000 mg | DELAYED_RELEASE_TABLET | Freq: Every day | ORAL | Status: DC

## 2012-03-24 MED ORDER — CARVEDILOL 12.5 MG PO TABS: 12.5000 mg | ORAL_TABLET | Freq: Two times a day (BID) | ORAL | Status: DC

## 2012-03-24 NOTE — Discharge Instructions (Signed)
[place] [address]. [phone]   [address] [place] 1. Head west toward [address] 46 feet 2. Turn left onto [address] 0.2 miles 3. Turn left onto [address] 367 feet 4. Take the 1st right onto [address] 0.1 miles 5. Turn right onto [address] 2.7 miles 6. Slight left onto [address] W W 2.0 miles 7. Take the [address] ramp 0.3 miles 8. Turn right onto [address] 0.4 miles 9. Slight left onto [address] 390 feet 10. Take the 1st right onto [facility] will be on the right 0.2 miles [place], [place] [address]

## 2012-03-24 NOTE — Discharge Summary (Signed)
Patient ID: Eric [name] MRN: [id number] DOB/AGE: 04/07/1951 61 y.o.  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  He has renal insufficiency and his ACE I had been stopped and he was started on Bidil.  It was felt in April that his cardiomyopathy was from HTN.  He was diuresed and was started on Amiodarone.  He was started on Eliquis and underwent TEE/DCCV to NSR.  Post cardioversion he had some problems with hypotension and worsening creatinine and his aldactone was stopped and Lasix held.  His carvedilol was decreased to 12.5mg  BID and BP improved.  On the day of discharge the patient was doing well without complaints of palpitations or SOB.  He still has 2+ LE edema which is slowly improving.     Discharge Exam: Blood pressure 109/77, pulse 88, temperature 98.4 F (36.9 C), temperature source Oral, resp. rate 18, height 5' 9.5" (1.765 m), weight 109.5 kg (241 lb 6.5 oz), SpO2 98.00%.   General appearance: alert Resp: clear to auscultation bilaterally Cardio: regular rate and rhythm, S1, S2 normal, no murmur, click, rub or gallop GI: soft, non-tender; bowel sounds normal; no masses,  no organomegaly Extremities: edema 2+ Labs:   Lab Results  Component Value Date   WBC 8.8 03/24/2012   HGB 12.7* 03/24/2012   HCT 37.1* 03/24/2012   MCV 88.1 03/24/2012   PLT 412* 03/24/2012    Lab 03/24/12 0606 03/22/12 0647  NA 136 --  K 4.0  --  CL 104 --  CO2 20 --  BUN 36* --  CREATININE 1.96* --  CALCIUM 9.0 --  PROT -- 5.6*  BILITOT -- 0.7  ALKPHOS -- 54  ALT -- 48  AST -- 33  GLUCOSE 97 --   Lab Results  Component Value Date   CKTOTAL 366* 02/13/2012   CKMB 5.0* 02/13/2012   TROPONINI <0.30 02/13/2012    Lab Results  Component Value Date   CHOL 161 02/13/2012   Lab Results  Component Value Date   HDL 42 02/13/2012   Lab Results  Component Value Date   LDLCALC 89 02/13/2012   Lab Results  Component Value Date   TRIG 151* 02/13/2012   Lab Results  Component Value Date   CHOLHDL 3.8 02/13/2012   No results found for this basename: LDLDIRECT      Radiology:  *RADIOLOGY REPORT*  Clinical Data: 61 year old male with congestive heart failure.  CHEST - 2 VIEW  Comparison: 02/14/2012 and earlier.  Findings: Increased right pleural effusion, now moderate.  Pulmonary vascular congestion has resolved and there is no edema.  Small left pleural effusion has resolved. Stable cardiomegaly and  mediastinal contours. No pneumothorax. Visualized tracheal air  column is within normal limits. No acute osseous abnormality  identified.  IMPRESSION:  1. Increased right pleural effusion, moderate with lung base  atelectasis.  2. At the same time pulmonary edema and small left effusion have  resolved.  3. Stable cardiomegaly.  Original Report Authenticated By: Randall An, M.D.  EKG:  NSR with PAC and LBBB  FOLLOW UP PLANS AND APPOINTMENTS Discharge Orders    Future Orders Please Complete By Expires   Diet - low sodium heart healthy      Increase activity slowly      Call MD for:  temperature >100.4      Call MD for:  persistant nausea and vomiting      Call MD for:  severe uncontrolled pain      Call MD for:  difficulty breathing, headache or visual disturbances      Call MD for:  persistant dizziness or light-headedness      Call MD for:  extreme fatigue      (HEART FAILURE PATIENTS) Call MD:  Anytime you  have any of the following symptoms: 1) 3 pound weight gain in 24 hours or 5 pounds in 1 week 2) shortness of breath, with or without a dry hacking cough 3) swelling in the hands, feet or stomach 4) if you have to sleep on extra pillows at night in order to breathe.        Medication List  As of 03/24/2012 11:44 AM   STOP taking these medications         spironolactone 25 MG tablet            Lisinopril 40mg           TAKE these medications         amiodarone 200 MG tablet   Commonly known as: PACERONE   Take 2 tablets (200 mg total) by mouth daily.       apixaban 5 MG Tabs tablet   Commonly known as: ELIQUIS   Take 1 tablet (5 mg total) by mouth every 12 (twelve) hours.      aspirin 81 MG EC tablet   Take 1 tablet (81 mg total) by mouth daily.      azelastine 137 MCG/SPRAY nasal spray   Commonly known as: ASTELIN   Place 2 sprays into the nose daily. Use in each nostril as directed      carvedilol 12.5 MG tablet   Commonly known as: COREG   Take 1 tablet (12.5 mg total) by mouth 2 (two) times daily with a meal.      docusate sodium 100 MG capsule   Commonly known as: COLACE   Take 100-200 mg by mouth at bedtime as needed. For constipation      fluticasone 50 MCG/ACT nasal spray   Commonly known as: FLONASE   Place 2 sprays into the nose daily.      furosemide 40 MG tablet   Commonly known as: LASIX   Take 1 tablet (40 mg total) by mouth daily.      isosorbide-hydrALAZINE 20-37.5 MG per tablet   Commonly known as: BIDIL   Take 1 tablet by mouth 2 (two) times daily.      lactobacillus acidophilus Tabs   Take 1 tablet by mouth daily.      magnesium hydroxide 400 MG/5ML suspension   Commonly known as: MILK OF MAGNESIA   Take 15 mLs by mouth daily as needed. For constipation      magnesium oxide 400 MG tablet   Commonly known as: MAG-OX   Take 400 mg by mouth daily.      potassium chloride SA 20 MEQ tablet   Commonly known as: K-DUR,KLOR-CON   Take 20 mEq by  mouth daily.  temazepam 15 MG capsule   Commonly known as: RESTORIL   Take 15 mg by mouth at bedtime as needed. For sleep      thiamine 100 MG tablet   Commonly known as: VITAMIN B-1   Take 100 mg by mouth daily.      VITAMIN D-3 PO   Take 1 tablet by mouth daily.           Follow-up Information    Follow up with Sinclair Grooms, MD. (for labs any time on Tuesday 7/2)    Contact information:   [address] [place] [phone]       Schedule an appointment as soon as possible for a visit with Sinclair Grooms, MD. (call for appointment in 1 week)    Contact information:   [place] [phone]          [place]  Time spent with patient to include physician time: 45 minutes Signed: Sueanne Margarita 03/24/2012, 11:44 AM

## 2012-03-24 NOTE — Progress Notes (Signed)
Can probably be discharged today on amio 400 mg daily, carvedilol 12.5 mg BID, Bidil twice daily, lasix 40 mg daily, Eliquis 5 mg BID, kcl 10 meq daily with bmet wednesday

## 2012-04-02 ENCOUNTER — Encounter: Payer: Self-pay | Admitting: Cardiology

## 2012-04-02 ENCOUNTER — Institutional Professional Consult (permissible substitution): Payer: BC Managed Care – PPO | Admitting: Internal Medicine

## 2012-04-18 ENCOUNTER — Encounter (HOSPITAL_COMMUNITY): Payer: Self-pay | Admitting: Pharmacy Technician

## 2012-04-18 ENCOUNTER — Other Ambulatory Visit: Payer: Self-pay | Admitting: Interventional Cardiology

## 2012-04-19 ENCOUNTER — Encounter (HOSPITAL_COMMUNITY)
Admission: RE | Disposition: A | Discharge status: Home / Self Care | Payer: Self-pay | Source: Ambulatory Visit | Attending: Interventional Cardiology

## 2012-04-19 ENCOUNTER — Ambulatory Visit (HOSPITAL_COMMUNITY): Payer: BC Managed Care – PPO | Admitting: Anesthesiology

## 2012-04-19 ENCOUNTER — Encounter (HOSPITAL_COMMUNITY): Payer: Self-pay | Admitting: Anesthesiology

## 2012-04-19 ENCOUNTER — Ambulatory Visit (HOSPITAL_COMMUNITY)
Admission: RE | Admit: 2012-04-19 | Discharge: 2012-04-19 | Disposition: A | Discharge status: Home / Self Care | Payer: BC Managed Care – PPO | Source: Ambulatory Visit | Attending: Interventional Cardiology | Admitting: Interventional Cardiology

## 2012-04-19 DIAGNOSIS — I4892 Unspecified atrial flutter: Secondary | ICD-10-CM | POA: Insufficient documentation

## 2012-04-19 HISTORY — PX: CARDIOVERSION: SHX1299

## 2012-04-19 SURGERY — CARDIOVERSION
Anesthesia: General | Wound class: Clean

## 2012-04-19 MED ORDER — SODIUM CHLORIDE 0.9 % IV SOLN
INTRAVENOUS | Status: DC | PRN
  Administered 2012-04-19: 08:00:00 via INTRAVENOUS

## 2012-04-19 MED ORDER — SODIUM CHLORIDE 0.9 % IV SOLN: 250.0000 mL | INTRAVENOUS | Status: DC

## 2012-04-19 MED ORDER — PROPOFOL 10 MG/ML IV EMUL
INTRAVENOUS | Status: DC | PRN
  Administered 2012-04-19: 100 mg via INTRAVENOUS

## 2012-04-19 MED ORDER — HYDROCORTISONE 1 % EX CREA: 1.0000 "application " | TOPICAL_CREAM | Freq: Three times a day (TID) | CUTANEOUS | Status: DC | PRN

## 2012-04-19 MED ORDER — SODIUM CHLORIDE 0.9 % IJ SOLN: 3.0000 mL | Freq: Two times a day (BID) | INTRAMUSCULAR | Status: DC

## 2012-04-19 MED ORDER — SODIUM CHLORIDE 0.9 % IJ SOLN: 3.0000 mL | INTRAMUSCULAR | Status: DC | PRN

## 2012-04-19 NOTE — Transfer of Care (Signed)
Immediate Anesthesia Transfer of Care Note  Patient: Eric [name]  Procedure(s) Performed: Procedure(s) (LRB): CARDIOVERSION (N/A)  Patient Location: Short Stay  Anesthesia Type: MAC  Level of Consciousness: sedated, patient cooperative and responds to stimulation  Airway & Oxygen Therapy: Patient Spontanous Breathing and Patient connected to nasal cannula oxygen  Post-op Assessment: Post -op Vital signs reviewed and stable and Patient moving all extremities X 4  Post vital signs: Reviewed and stable  Complications: No apparent anesthesia complications

## 2012-04-19 NOTE — Anesthesia Postprocedure Evaluation (Signed)
  Anesthesia Post-op Note  Patient: Eric [name]  Procedure(s) Performed: Procedure(s) (LRB): CARDIOVERSION (N/A)  Patient Location: PACU and Short Stay  Anesthesia Type: MAC  Level of Consciousness: awake, oriented and patient cooperative  Airway and Oxygen Therapy: Patient Spontanous Breathing and Patient connected to nasal cannula oxygen  Post-op Pain: none  Post-op Assessment: Post-op Vital signs reviewed and Patient's Cardiovascular Status Stable  Post-op Vital Signs: Reviewed and stable  Complications: No apparent anesthesia complications

## 2012-04-19 NOTE — Anesthesia Preprocedure Evaluation (Addendum)
Anesthesia Evaluation  Patient identified by MRN, date of birth, ID band Patient awake    Reviewed: Allergy & Precautions, H&P , NPO status , Patient's Chart, lab work & pertinent test results, reviewed documented beta blocker date and time   Airway Mallampati: I TM Distance: >3 FB Neck ROM: Full    Dental  (+) Teeth Intact and Dental Advisory Given   Pulmonary shortness of breath and with exertion,  breath sounds clear to auscultation        Cardiovascular hypertension, Pt. on medications and Pt. on home beta blockers +CHF + dysrhythmias Atrial Fibrillation Rhythm:Irregular Rate:Tachycardia     Neuro/Psych    GI/Hepatic   Endo/Other    Renal/GU      Musculoskeletal   Abdominal   Peds  Hematology   Anesthesia Other Findings   Reproductive/Obstetrics                         Anesthesia Physical Anesthesia Plan  ASA: III  Anesthesia Plan: General   Post-op Pain Management:    Induction: Intravenous  Airway Management Planned: Mask  Additional Equipment:   Intra-op Plan:   Post-operative Plan:   Informed Consent: I have reviewed the patients History and Physical, chart, labs and discussed the procedure including the risks, benefits and alternatives for the proposed anesthesia with the patient or authorized representative who has indicated his/her understanding and acceptance.     Plan Discussed with: CRNA and Surgeon  Anesthesia Plan Comments:         Anesthesia Quick Evaluation

## 2012-04-19 NOTE — Anesthesia Postprocedure Evaluation (Signed)
  Anesthesia Post-op Note  Patient: Eric [name]  Procedure(s) Performed: Procedure(s) (LRB): CARDIOVERSION (N/A)  Patient Location: PACU  Anesthesia Type: General  Level of Consciousness: awake, alert  and oriented  Airway and Oxygen Therapy: Patient Spontanous Breathing  Post-op Pain: none  Post-op Assessment: Post-op Vital signs reviewed and Patient's Cardiovascular Status Stable  Post-op Vital Signs: stable  Complications: No apparent anesthesia complications

## 2012-04-19 NOTE — CV Procedure (Signed)
Electrical Cardioversion Procedure Note Eric [name] [id number] Nov 15, 1950  Procedure: Electrical Cardioversion Indications:  Atrial Flutter  Time Out: Verified patient identification, verified procedure,medications/allergies/relevent history reviewed, required imaging and test results available.  Performed  Procedure Details  The patient was NPO after midnight. Anesthesia was administered at the beside  by Dr.Joslin with 100mg  of propofol.  Cardioversion was done with synchronized biphasic defibrillation with AP pads with 120 watts.  The patient converted to normal sinus rhythm. The patient tolerated the procedure well   IMPRESSION:  Successful cardioversion of atrial flutter to NSR with frequent PAC's    Sinclair Grooms 04/19/2012, 8:50 AM

## 2012-04-22 ENCOUNTER — Encounter (HOSPITAL_COMMUNITY): Payer: Self-pay | Admitting: Interventional Cardiology

## 2012-05-08 ENCOUNTER — Ambulatory Visit: Payer: BC Managed Care – PPO | Admitting: Internal Medicine

## 2012-05-28 ENCOUNTER — Institutional Professional Consult (permissible substitution): Payer: BC Managed Care – PPO | Admitting: Internal Medicine

## 2013-02-26 ENCOUNTER — Encounter: Payer: Self-pay | Admitting: Nurse Practitioner

## 2013-03-03 ENCOUNTER — Ambulatory Visit (INDEPENDENT_AMBULATORY_CARE_PROVIDER_SITE_OTHER): Payer: BC Managed Care – PPO | Admitting: Nurse Practitioner

## 2013-03-03 ENCOUNTER — Encounter: Payer: Self-pay | Admitting: Nurse Practitioner

## 2013-03-03 VITALS — BP 140/70 | HR 66 | Ht 67.5 in | Wt 236.4 lb

## 2013-03-03 DIAGNOSIS — Z7901 Long term (current) use of anticoagulants: Secondary | ICD-10-CM

## 2013-03-03 DIAGNOSIS — Z1211 Encounter for screening for malignant neoplasm of colon: Secondary | ICD-10-CM

## 2013-03-04 ENCOUNTER — Encounter: Payer: Self-pay | Admitting: Nurse Practitioner

## 2013-03-04 ENCOUNTER — Other Ambulatory Visit: Payer: Self-pay | Admitting: *Deleted

## 2013-03-04 DIAGNOSIS — Z1211 Encounter for screening for malignant neoplasm of colon: Secondary | ICD-10-CM

## 2013-03-04 DIAGNOSIS — Z7901 Long term (current) use of anticoagulants: Secondary | ICD-10-CM

## 2013-03-04 HISTORY — DX: Encounter for screening for malignant neoplasm of colon: Z12.11

## 2013-03-04 HISTORY — DX: Long term (current) use of anticoagulants: Z79.01

## 2013-03-04 NOTE — Progress Notes (Signed)
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 He has a history of atrial fibrillation, has undergone cardioversion, currently on amiodarone and Eliquis. No [place] of colon cancer. No GI complaints such as abdominal pain, nausea, bowel changes, or blood in stool. No weight loss, in fact he has gained weight.   Past Medical History  Diagnosis Date  . Claustrophobia   . Heart murmur   . CHF (congestive heart failure)   . Hypertension   . Varicose vein of leg     right  . Dysrhythmia     "palpitations"  . Exertional dyspnea 01/2012  . Migraine 02/13/12    "opthalmic"  . Chronic lower back pain    Family History  Problem Relation Age of Onset  . Hypertension Mother   . Heart disease Mother   . Hypertension Father   . Heart disease Father   . Heart failure Mother   . Diabetes Mother   . Heart failure Father   . Heart failure Mother    History  Substance Use Topics  . Smoking status: Never Smoker   . Smokeless tobacco: Never Used  . Alcohol Use: Yes     Comment: 02/13/12 "socially"   Current Outpatient Prescriptions  Medication Sig Dispense Refill  . amiodarone (PACERONE) 200 MG tablet Take 400 mg by mouth daily.      Marland Kitchen apixaban (ELIQUIS) 5 MG TABS tablet Take by mouth 2 (two) times daily.      Marland Kitchen aspirin EC 81 MG tablet Take 81 mg by mouth daily.      Marland Kitchen atorvastatin (LIPITOR) 20 MG tablet Take 20 mg by mouth daily.      Marland Kitchen azelastine (ASTELIN) 137 MCG/SPRAY nasal spray Place 2 sprays into the nose daily. Use in each nostril as directed      . carvedilol (COREG) 12.5 MG tablet Take 12.5 mg by mouth 2 (two) times daily with a meal.       . Cholecalciferol (VITAMIN D-3 PO) Take 1 tablet by mouth daily.      Marland Kitchen docusate sodium (COLACE) 100 MG capsule Take 100-200 mg by mouth at bedtime as needed. For constipation      . fluticasone (FLONASE) 50 MCG/ACT nasal spray Place 2 sprays into the nose daily.      . furosemide (LASIX) 80 MG tablet Take  80 mg by mouth daily.      . isosorbide-hydrALAZINE (BIDIL) 20-37.5 MG per tablet Take 1 tablet by mouth 2 (two) times daily.      . magnesium oxide (MAG-OX) 400 MG tablet Take 400 mg by mouth daily.      Marland Kitchen spironolactone (ALDACTONE) 25 MG tablet Take 25 mg by mouth daily.       . temazepam (RESTORIL) 15 MG capsule Take 15 mg by mouth at bedtime as needed. For sleep       No current facility-administered medications for this visit.   Allergies  Allergen Reactions  . Lactose Intolerance (Gi) Diarrhea   Review of Systems: All systems reviewed and negative except where noted in HPI.   Physical Exam: BP 140/70  Pulse 66  Ht 5' 7.5" (1.715 m)  Wt 236 lb 6 oz (107.219 kg)  BMI 36.45 kg/m2 Constitutional: Pleasant,well-developed, black male in no acute distress. HEENT: Normocephalic and atraumatic. No scleral icterus. Neck supple.  Cardiovascular: Normal rate, occasional irregular beat.  Pulmonary/chest: Effort normal and breath sounds normal. No wheezing, rales or rhonchi. Abdominal: Soft,  nondistended, nontender. Bowel sounds active throughout. There are no masses palpable. No hepatomegaly. Extremities: no edema Lymphadenopathy: No cervical adenopathy noted. Neurological: Alert and oriented to person place and time. Skin: Skin is warm and dry. No rashes noted. Psychiatric: Normal mood and affect. Behavior is normal.   ASSESSMENT AND PLAN:  55. 62 year old male here for colon cancer screening evaluation. This will be his first colonoscopy. Patient is on Eliquis which can preferable be held for the procedure. Will contact Dr. Tamala Julian (cardiology) regarding Eliquis. Patient will be scheduled for colonoscopy with propofol with Dr. Deatra Ina (patient's preference). Procedure will be done at [facility] as patient's EF last year was around 30 %.  2. Afib, s/p cardioversion. Maintained on Amiodarone and Eliquis.   3. History of CHF, on diuretics.

## 2013-03-04 NOTE — Progress Notes (Signed)
Reviewed and agree with management. Shavette Shoaff D. Marvel Mcphillips, M.D., FACG  

## 2013-03-05 ENCOUNTER — Telehealth: Payer: Self-pay | Admitting: *Deleted

## 2013-03-05 NOTE — Telephone Encounter (Signed)
I called the patient and advised him that Dr. Pernell Dupre responded and he said that the pt can be off the Eliquis at least 48 hours.  He can resume it when it is safe.  I told [name] to ask Dr. Deatra Ina about when he should start back on the Eliquis incase polyps were removed after the procedure .

## 2013-03-05 NOTE — Telephone Encounter (Signed)
I called [name] to advise him that we heard from Dr. Pernell Dupre.  We advised him that Dr. Tamala Julian said he could be off the Eliquis for at least 28 hours.  I told him to hold the Eliquis on 6-25 and 6-26.  Dr. Tamala Julian said for him to stay off of the medication until safe.  I told him he could discuss this with Dr. Deatra Ina on the day of the procedure if polyps were removed. He said he would talk to Dr. Deatra Ina after the procedure. He thanked me for calling with this information.

## 2013-03-05 NOTE — Telephone Encounter (Signed)
Message copied by Eric [name] on Wed Mar 05, 2013  4:51 PM ------      Message from: Eric [name]      Created: Tue Mar 04, 2013  7:58 AM      Regarding: RE: Clearance for colonoscopy, Eliquis.       He needs to be off at least 48 hours. Could be off longer if needed. Do not resume until safe.            Eric [name]      ----- Message -----         From: Eric [name], CMA         Sent: 03/03/2013   4:44 PM           To: Eric Grooms, MD      Subject: Clearance for colonoscopy, Eliquis.                            03/03/2013                        RE: Eric [name]      DOB: 06/06/1951      MRN: [id number]                  Dear Eric [name],                   We have scheduled the above patient for an endoscopic procedure. Our records show that he is on anticoagulation therapy.             Please advise as to how long the patient may come off his therapy of Eliquis prior to the Colonoscopy procedure, which is scheduled for 03-21-2013 with Eric [name].            Please fax back/ or route the completed form to Eric [name] at [phone].             Sincerely,                  Eric [name]                    ------

## 2013-03-07 ENCOUNTER — Telehealth: Payer: Self-pay | Admitting: *Deleted

## 2013-03-07 NOTE — Telephone Encounter (Signed)
[name] called to ask if he could change the hospital procedure date with Dr. Deatra Ina from 03-21-2013 to 04-11-2013.  I told him I would call him back.  Genella Mech CMA looked at Dr. Kelby Fam schedule for 04-11-2013 and he is the hospital MD that week.  Shirlean Mylar said we could change [name] to 04-11-2013.  I called the [facility] endo unit and spoke to [place].  I had her change the procedure date from 03-21-2013 to  to 04-11-2013 at 8:30 am. The patient should arrive at 7:30 am.  Called [name] and told him and he thanked me for getting it changed.

## 2013-04-11 ENCOUNTER — Ambulatory Visit (HOSPITAL_COMMUNITY)
Admission: RE | Admit: 2013-04-11 | Discharge: 2013-04-11 | Disposition: A | Discharge status: Home / Self Care | Payer: BC Managed Care – PPO | Source: Ambulatory Visit | Attending: Gastroenterology | Admitting: Gastroenterology

## 2013-04-11 ENCOUNTER — Encounter (HOSPITAL_COMMUNITY): Payer: Self-pay | Admitting: Gastroenterology

## 2013-04-11 ENCOUNTER — Encounter (HOSPITAL_COMMUNITY)
Admission: RE | Disposition: A | Discharge status: Home / Self Care | Payer: Self-pay | Source: Ambulatory Visit | Attending: Gastroenterology

## 2013-04-11 DIAGNOSIS — E039 Hypothyroidism, unspecified: Secondary | ICD-10-CM | POA: Insufficient documentation

## 2013-04-11 DIAGNOSIS — E739 Lactose intolerance, unspecified: Secondary | ICD-10-CM | POA: Insufficient documentation

## 2013-04-11 DIAGNOSIS — D126 Benign neoplasm of colon, unspecified: Secondary | ICD-10-CM | POA: Insufficient documentation

## 2013-04-11 DIAGNOSIS — I129 Hypertensive chronic kidney disease with stage 1 through stage 4 chronic kidney disease, or unspecified chronic kidney disease: Secondary | ICD-10-CM | POA: Insufficient documentation

## 2013-04-11 DIAGNOSIS — K648 Other hemorrhoids: Secondary | ICD-10-CM

## 2013-04-11 DIAGNOSIS — G43909 Migraine, unspecified, not intractable, without status migrainosus: Secondary | ICD-10-CM | POA: Insufficient documentation

## 2013-04-11 DIAGNOSIS — Z1211 Encounter for screening for malignant neoplasm of colon: Secondary | ICD-10-CM | POA: Insufficient documentation

## 2013-04-11 DIAGNOSIS — I8390 Asymptomatic varicose veins of unspecified lower extremity: Secondary | ICD-10-CM | POA: Insufficient documentation

## 2013-04-11 DIAGNOSIS — R011 Cardiac murmur, unspecified: Secondary | ICD-10-CM | POA: Insufficient documentation

## 2013-04-11 DIAGNOSIS — K6389 Other specified diseases of intestine: Secondary | ICD-10-CM | POA: Insufficient documentation

## 2013-04-11 DIAGNOSIS — I509 Heart failure, unspecified: Secondary | ICD-10-CM | POA: Insufficient documentation

## 2013-04-11 DIAGNOSIS — N189 Chronic kidney disease, unspecified: Secondary | ICD-10-CM | POA: Insufficient documentation

## 2013-04-11 HISTORY — DX: Encounter for screening for malignant neoplasm of colon: Z12.11

## 2013-04-11 HISTORY — DX: Chronic kidney disease, unspecified: N18.9

## 2013-04-11 HISTORY — DX: Other hemorrhoids: K64.8

## 2013-04-11 HISTORY — DX: Hypothyroidism, unspecified: E03.9

## 2013-04-11 HISTORY — PX: COLONOSCOPY: SHX5424

## 2013-04-11 SURGERY — COLONOSCOPY
Anesthesia: Moderate Sedation

## 2013-04-11 MED ORDER — FENTANYL CITRATE 0.05 MG/ML IJ SOLN
INTRAMUSCULAR | Status: DC | PRN
  Administered 2013-04-11 (×3): 25 ug via INTRAVENOUS

## 2013-04-11 MED ORDER — MIDAZOLAM HCL 10 MG/2ML IJ SOLN
INTRAMUSCULAR | Status: AC
  Filled 2013-04-11: qty 4

## 2013-04-11 MED ORDER — MIDAZOLAM HCL 5 MG/5ML IJ SOLN
INTRAMUSCULAR | Status: DC | PRN
  Administered 2013-04-11 (×2): 2 mg via INTRAVENOUS
  Administered 2013-04-11: 1 mg via INTRAVENOUS
  Administered 2013-04-11: 2 mg via INTRAVENOUS

## 2013-04-11 MED ORDER — FENTANYL CITRATE 0.05 MG/ML IJ SOLN
INTRAMUSCULAR | Status: AC
  Filled 2013-04-11: qty 4

## 2013-04-11 MED ORDER — DIPHENHYDRAMINE HCL 50 MG/ML IJ SOLN
INTRAMUSCULAR | Status: AC
  Filled 2013-04-11: qty 1

## 2013-04-11 MED ORDER — SODIUM CHLORIDE 0.9 % IV SOLN
INTRAVENOUS | Status: DC
  Administered 2013-04-11: 08:00:00 via INTRAVENOUS

## 2013-04-11 NOTE — H&P (Signed)
  History of Present Illness:  62 year old Afro-American male here for screening colonoscopy. He has no GI complaints including change in bowel habits, melena or hematochezia. He is taking eliquis which he has held for 2 days.   Past Medical History  Diagnosis Date  . Claustrophobia   . Heart murmur   . Hypertension   . Varicose vein of leg     right  . Dysrhythmia     "palpitations"  . Migraine 02/13/12    "opthalmic"  . Chronic lower back pain   . Exertional dyspnea 01/2012  . CHF (congestive heart failure)   . Hypothyroidism   . Chronic kidney disease     kidney fx studies increased    Past Surgical History  Procedure Laterality Date  . Skin melanocytoma excision  2012    "above left clavicle"  . Finger surgery  2012    "4th digit right hand; thumb on left hand"  . Tee without cardioversion  03/22/2012    Procedure: TRANSESOPHAGEAL ECHOCARDIOGRAM (TEE);  Surgeon: Candee Furbish, MD;  Location: [facility] ENDOSCOPY;  Service: Cardiovascular;  Laterality: N/A;  . Cardioversion  03/22/2012    Procedure: CARDIOVERSION;  Surgeon: Candee Furbish, MD;  Location: [place];  Service: Cardiovascular;  Laterality: N/A;  . Cardioversion  04/19/2012    Procedure: CARDIOVERSION;  Surgeon: Sinclair Grooms, MD;  Location: [place];  Service: Cardiovascular;  Laterality: N/A;  . Colonoscopy N/A 04/11/2013    Procedure: COLONOSCOPY;  Surgeon: Inda Castle, MD;  Location: [facility] ENDOSCOPY;  Service: Endoscopy;  Laterality: N/A;   family history includes Diabetes in his mother; Heart disease in his father and mother; Heart failure in his father and mothers; and Hypertension in his father and mother. Current Facility-Administered Medications  Medication Dose Route Frequency Provider Last Rate Last Dose  . 0.9 %  sodium chloride infusion   Intravenous Continuous Inda Castle, MD 20 mL/hr at 04/11/13 [id number]     Allergies as of 03/03/2013 - Review Complete 03/03/2013  Allergen Reaction Noted  . Lactose  intolerance (gi) Diarrhea 03/19/2012    reports that he has never smoked. He has never used smokeless tobacco. He reports that  drinks alcohol. He reports that he does not use illicit drugs.     Review of Systems: Pertinent positive and negative review of systems were noted in the above HPI section. All other review of systems were otherwise negative.  Vital signs were reviewed in today's medical record Physical Exam: General: Well developed , well nourished, no acute distress Skin: anicteric Head: Normocephalic and atraumatic Eyes:  sclerae anicteric, EOMI Ears: Normal auditory acuity Mouth: No deformity or lesions Neck: Supple, no masses or thyromegaly Lungs: Clear throughout to auscultation Heart: Regular rate and rhythm; no murmurs, rubs or bruits Abdomen: Soft, non tender and non distended. No masses, hepatosplenomegaly or hernias noted. Normal Bowel sounds Rectal:deferred Musculoskeletal: Symmetrical with no gross deformities  Skin: No lesions on visible extremities Pulses:  Normal pulses noted Extremities: No clubbing, cyanosis, edema or deformities noted Neurological: Alert oriented x 4, grossly nonfocal Cervical Nodes:  No significant cervical adenopathy Inguinal Nodes: No significant inguinal adenopathy Psychological:  Alert and cooperative. Normal mood and affect  Imp/Plan - screening colonoscopy for colorectal cancer screening

## 2013-04-11 NOTE — Op Note (Signed)
[facility] [place], [address]   COLONOSCOPY PROCEDURE REPORT  PATIENT: Eric [name], Eric [name]  MR#: [id number] BIRTHDATE: [birth year] , 34  yrs. old GENDER: Male ENDOSCOPIST: Inda Castle, MD REFERRED [id number] Baird Cancer, M.D. PROCEDURE DATE:  04/11/2013 PROCEDURE:   Colonoscopy with biopsy ASA CLASS:   Class II INDICATIONS:Average risk patient for colon cancer. MEDICATIONS: These medications were titrated to patient response per physician's verbal order, Versed 6 mg IV, and Fentanyl 75 mcg IV  DESCRIPTION OF PROCEDURE:   After the risks benefits and alternatives of the procedure were thoroughly explained, informed consent was obtained.  A digital rectal exam revealed no abnormalities of the rectum.   The     endoscope was introduced through the anus and advanced to the cecum, which was identified by both the appendix and ileocecal valve. No adverse events experienced.   The quality of the prep was Suprep good  The instrument was then slowly withdrawn as the colon was fully examined.      COLON FINDINGS: There is very slight prominence of the ileocecal valve.  Biopsies were taken to rule out adenomatous changes. Internal hemorrhoids were found.   The colon mucosa was otherwise normal.  Retroflexed views revealed no abnormalities. The time to cecum=  .  Withdrawal time=9 minutes 0 seconds.  The scope was withdrawn and the procedure completed. COMPLICATIONS: There were no complications.  ENDOSCOPIC IMPRESSION: 1.  slight prominence of ileocecal valve 2.  small internal hemorrhoids  RECOMMENDATIONS: Await biopsy results; if negative followup colonoscopy in 10 years  eSigned:  Inda Castle, MD 04/11/2013 9:16 AM   cc:

## 2013-04-14 ENCOUNTER — Encounter (HOSPITAL_COMMUNITY): Payer: Self-pay | Admitting: Gastroenterology

## 2013-04-22 ENCOUNTER — Encounter: Payer: Self-pay | Admitting: Gastroenterology

## 2013-05-05 ENCOUNTER — Institutional Professional Consult (permissible substitution): Payer: BC Managed Care – PPO | Admitting: Internal Medicine

## 2013-05-22 ENCOUNTER — Encounter: Payer: Self-pay | Admitting: *Deleted

## 2013-05-23 ENCOUNTER — Encounter: Payer: Self-pay | Admitting: Internal Medicine

## 2013-05-23 ENCOUNTER — Ambulatory Visit (INDEPENDENT_AMBULATORY_CARE_PROVIDER_SITE_OTHER): Payer: BC Managed Care – PPO | Admitting: Internal Medicine

## 2013-05-23 VITALS — BP 114/68 | HR 60 | Ht 70.0 in | Wt 233.8 lb

## 2013-05-23 DIAGNOSIS — I351 Nonrheumatic aortic (valve) insufficiency: Secondary | ICD-10-CM

## 2013-05-23 DIAGNOSIS — I5022 Chronic systolic (congestive) heart failure: Secondary | ICD-10-CM

## 2013-05-23 DIAGNOSIS — I359 Nonrheumatic aortic valve disorder, unspecified: Secondary | ICD-10-CM

## 2013-05-23 DIAGNOSIS — I4891 Unspecified atrial fibrillation: Secondary | ICD-10-CM

## 2013-05-23 NOTE — Patient Instructions (Signed)
Please stop your Amiodarone Continue all other medications as listed.  Follow up as needed.

## 2013-05-23 NOTE — Progress Notes (Signed)
ELECTROPHYSIOLOGY CONSULT NOTE  Patient ID: Eric [name], MRN: [id number], DOB/AGE: 04-26-1951 62 y.o. Admit date: (Not on file) Date of Consult: 05/23/2013  Primary Physician: [name], MD Primary Cardiologist:  [facility]  Chief Complaint:  Atrial flutter   HPI Eric [name] is a 62 y.o. male  Referred for consideration of catheter ablation of atrial flutter. 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 He had a long-standing history of antecedent palpitations and descriptions atrial fibrillation/ nonsustained atrial tachycardia and ectopy which prompted the decision to use amiodarone as opposed to pursue catheter ablation. He underwent cardioversion and had been in sinus rhythm for some time and was recently found to be in atrial flutter again.  No untoward symptoms were noted following a transition from sinus to atrial flutter related to exercise or palpitations. He has been on apixaban throughout. Recent echocardiogram demonstrated improved left ventricular function with an EF of 40-45%. Left atrial dimensions remain modestly enlarged at 44 mm. 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 There is modest pulmonary hypertension  Thromboembolic risk profile is notable for hypertension and cardiomyopathy.  He is obstructive breathing pattern but denies significant daytime somnolence      Past Medical History  Diagnosis Date  . Claustrophobia   . Heart murmur   . Hypertension   . Varicose vein of leg     right  . Dysrhythmia     "palpitations"  . Migraine 02/13/12    "opthalmic"  . Chronic lower back pain   . Exertional dyspnea 01/2012  . CHF (congestive heart failure)   . Hypothyroidism   . Chronic kidney disease     kidney fx studies increased       Surgical History:  Past Surgical History  Procedure Laterality Date  . Skin  melanocytoma excision  2012    "above left clavicle"  . Finger surgery  2012    "4th digit right hand; thumb on left hand"  . Tee without cardioversion  03/22/2012    Procedure: TRANSESOPHAGEAL ECHOCARDIOGRAM (TEE);  Surgeon: Candee Furbish, MD;  Location: [facility] ENDOSCOPY;  Service: Cardiovascular;  Laterality: N/A;  . Cardioversion  03/22/2012    Procedure: CARDIOVERSION;  Surgeon: Candee Furbish, MD;  Location: [place];  Service: Cardiovascular;  Laterality: N/A;  . Cardioversion  04/19/2012    Procedure: CARDIOVERSION;  Surgeon: Sinclair Grooms, MD;  Location: [place];  Service: Cardiovascular;  Laterality: N/A;  . Colonoscopy N/A 04/11/2013    Procedure: COLONOSCOPY;  Surgeon: Inda Castle, MD;  Location: [facility] ENDOSCOPY;  Service: Endoscopy;  Laterality: N/A;  . Colonoscopy N/A 04/11/2013    Procedure: COLONOSCOPY;  Surgeon: Inda Castle, MD;  Location: [facility] ENDOSCOPY;  Service: Endoscopy;  Laterality: N/A;     Home Meds: Prior to Admission medications   Medication Sig Start Date End Date Taking? Authorizing Provider  amiodarone (PACERONE) 200 MG tablet Take 200 mg by mouth daily.    Yes Historical Provider, MD  apixaban (ELIQUIS) 5 MG TABS tablet Take by mouth 2 (two) times daily.   Yes Historical Provider, MD  aspirin EC 81 MG tablet Take 81 mg by mouth daily.   Yes Historical Provider, MD  atorvastatin (LIPITOR) 20 MG tablet Take 20 mg by mouth daily.   Yes Historical Provider, MD  azelastine (ASTELIN) 137 MCG/SPRAY nasal spray Place 2 sprays  into the nose daily. Use in each nostril as directed   Yes Historical Provider, MD  carvedilol (COREG) 12.5 MG tablet Take 12.5 mg by mouth 2 (two) times daily with a meal.    Yes Historical Provider, MD  Cholecalciferol (VITAMIN D-3 PO) Take 1 tablet by mouth daily.   Yes Historical Provider, MD  docusate sodium (COLACE) 100 MG capsule Take 100-200 mg by mouth at bedtime as needed. For constipation   Yes Historical Provider, MD  fluticasone (FLONASE)  50 MCG/ACT nasal spray Place 2 sprays into the nose as needed.    Yes Historical Provider, MD  furosemide (LASIX) 80 MG tablet Take 80 mg by mouth daily.   Yes Historical Provider, MD  isosorbide-hydrALAZINE (BIDIL) 20-37.5 MG per tablet Take 1 tablet by mouth 2 (two) times daily.   Yes Historical Provider, MD  levothyroxine (SYNTHROID, LEVOTHROID) 75 MCG tablet Take 75 mcg by mouth daily before breakfast.   Yes Historical Provider, MD  magnesium oxide (MAG-OX) 400 MG tablet Take 400 mg by mouth daily.   Yes Historical Provider, MD  spironolactone (ALDACTONE) 25 MG tablet Take 25 mg by mouth daily.    Yes Historical Provider, MD      Allergies:  Allergies  Allergen Reactions  . Lactose Intolerance (Gi) Diarrhea    History   Social History  . Marital Status: Married    Spouse Name: [name]    Number of Children: 1  . Years of Education: N/A   Occupational History  .     Social History Main Topics  . Smoking status: Never Smoker   . Smokeless tobacco: Never Used  . Alcohol Use: Yes     Comment: 02/13/12 "socially"  . Drug Use: No  . Sexual Activity: Not Currently   Other Topics Concern  . 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     Family History  Problem Relation Age of Onset  . Hypertension Mother   . Heart disease Mother   . Hypertension Father   . Heart disease Father   . Heart failure Mother   . Diabetes Mother   . Heart failure Father   . Heart failure Mother      ROS:  Please see the history of present illness.     All other systems reviewed and negative.    Physical Exam:   Blood pressure 114/68, pulse 60, height 5\' 10"  (1.778 m), weight 233 lb 12.8 oz (106.051 kg). General: Well developed, obese age appearing 46 American male in no acute distress. Head: Normocephalic, atraumatic, sclera non-icteric, no xanthomas, nares are without discharge. EENT: normal Lymph Nodes:   none Back: without scoliosis/kyphosis , no CVA tendersness Neck: Negative for carotid bruits. JVD 10cm  Lungs: Clear bilaterally to auscultation without wheezes, rales, or rhonchi. Breathing is unlabored. Heart irregular rate and rhyhtm murmur , rubs, or gallops appreciated. Abdomen: Soft, non-tender, non-distended with normoactive bowel sounds. No hepatomegaly. No rebound/guarding. No obvious abdominal masses. Msk:  Strength and tone appear normal for age. Extremities: No clubbing or cyanosis. No  edema.  Distal pedal pulses are 2+ and equal bilaterally. Skin: Warm and Dry Neuro: Alert and oriented X 3. CN III-XII intact Grossly normal sensory and motor function . Psych:  Responds to questions appropriately with a normal affect.      Labs: Cardiac Enzymes No results found for this basename: CKTOTAL, CKMB, TROPONINI,  in the  last 72 hours CBC Lab Results  Component Value Date   WBC 8.8 03/24/2012   HGB 12.7* 03/24/2012   HCT 37.1* 03/24/2012   MCV 88.1 03/24/2012   PLT 412* 03/24/2012   PROTIME: No results found for this basename: LABPROT, INR,  in the last 72 hours Chemistry No results found for this basename: NA, K, CL, CO2, BUN, CREATININE, CALCIUM, LABALBU, PROT, BILITOT, ALKPHOS, ALT, AST, GLUCOSE,  in the last 168 hours Lipids Lab Results  Component Value Date   CHOL 161 02/13/2012   HDL 42 02/13/2012   LDLCALC 89 02/13/2012   TRIG 151* 02/13/2012   BNP Pro B Natriuretic peptide (BNP)  Date/Time Value Range Status  03/20/2012  6:08 AM 8346.0* 0 - 125 pg/mL Final  02/13/2012  6:28 PM 7452.0* 0 - 125 pg/mL Final   Miscellaneous No results found for this basename: DDIMER    Radiology/Studies:  No results found.  EKG:  Atrial flutter with ACL 260 msec and variable block LBBB   Assessment and Plan:       Virl Axe

## 2013-05-24 NOTE — Assessment & Plan Note (Signed)
The patient presented initially with atrial flutter with a rapid rate and underwent cardioversion under the support of amiodarone. He has done exceedingly well from an arrhythmia point of view without palpitations or changes in exercise tolerance; he has tolerated the amiodarone and his apixaban without bleeding.  He has not had atrial fibrillation although there were concerns regarding the amount of ambient atrial ectopy and nonsustained atrial tachycardia.   The potential value of flutter ablation at this time would be to allow the discontinuation of his anticoagulation and his antiarrhythmic drug. As relates to the former, he is disinclined to stop it in any case. This proclivity is supported by data that suggests that atrial fibrillation may be partly a marker of a thrombogenic milieu.  As relates to the amiodarone, it is not maintaining sinus rhythm and as he has no symptoms related to the recurrence of atrial flutter, I would recommend that we discontinue it so as to avoid its toxicities and if symptoms recur with a rapid rate use rate control or catheter ablation at that time.  Because of the absence of symptoms and his inclination to continue his apixaban, there is no clear reason to pursue catheter ablation now. There are theoretical benefits so to prevent further worsening of his atriopathy; that is insufficient in his mind to pursue this.  I suggested that he stop his aspirin.  He also has obstructive breathing. With his obesity hypertension he is at high risk for sleep apnea and would recommend a sleep study  We spent nearly an hour discussing above issues.

## 2013-05-24 NOTE — Assessment & Plan Note (Signed)
He also has evidence of right-sided pressure overload with venous distention and modest pulmonary hypertension. This may be further aggravated by sleep apnea

## 2013-06-21 ENCOUNTER — Other Ambulatory Visit: Payer: Self-pay | Admitting: Interventional Cardiology

## 2013-06-21 DIAGNOSIS — I4891 Unspecified atrial fibrillation: Secondary | ICD-10-CM

## 2013-07-03 ENCOUNTER — Other Ambulatory Visit: Payer: BC Managed Care – PPO

## 2013-07-04 ENCOUNTER — Other Ambulatory Visit (INDEPENDENT_AMBULATORY_CARE_PROVIDER_SITE_OTHER): Payer: BC Managed Care – PPO

## 2013-07-04 DIAGNOSIS — I4891 Unspecified atrial fibrillation: Secondary | ICD-10-CM

## 2013-07-04 LAB — CBC
HCT: 39 % (ref 39.0–52.0)
RDW: 13.9 % (ref 11.5–14.6)
WBC: 6.4 10*3/uL (ref 4.5–10.5)

## 2013-07-04 LAB — CREATININE, SERUM: Creatinine, Ser: 2.4 mg/dL — ABNORMAL HIGH (ref 0.4–1.5)

## 2013-07-08 ENCOUNTER — Telehealth: Payer: Self-pay

## 2013-07-08 DIAGNOSIS — I4892 Unspecified atrial flutter: Secondary | ICD-10-CM

## 2013-07-08 NOTE — Telephone Encounter (Signed)
Message copied by Lamar Laundry on Tue Jul 08, 2013  8:01 AM ------      Message from: Daneen Schick      Created: Sun Jul 06, 2013  4:48 PM       Hemoglobin stable but renal function reveals Creat 2.4.Check with Ysidro Evert to determine if dose of Eliquis needs adjustment? ------

## 2013-07-08 NOTE — Telephone Encounter (Signed)
pt adv of results and instructions. pt verbalized understanding. scheduled 6 mo f/u with Dr.Smith 08/06/13 @2 :45

## 2013-07-08 NOTE — Telephone Encounter (Signed)
Message copied by Lamar Laundry on Tue Jul 08, 2013  8:13 AM ------      Message from: Daneen Schick      Created: Sun Jul 06, 2013  4:48 PM       Hemoglobin stable but renal function reveals Creat 2.4.Check with Ysidro Evert to determine if dose of Eliquis needs adjustment? ------

## 2013-07-10 ENCOUNTER — Other Ambulatory Visit: Payer: Self-pay

## 2013-07-10 MED ORDER — ATORVASTATIN CALCIUM 20 MG PO TABS: 20.0000 mg | ORAL_TABLET | Freq: Every day | ORAL | Status: DC

## 2013-08-06 ENCOUNTER — Encounter (INDEPENDENT_AMBULATORY_CARE_PROVIDER_SITE_OTHER): Payer: Self-pay

## 2013-08-06 ENCOUNTER — Telehealth: Payer: Self-pay

## 2013-08-06 ENCOUNTER — Ambulatory Visit (INDEPENDENT_AMBULATORY_CARE_PROVIDER_SITE_OTHER): Payer: BC Managed Care – PPO | Admitting: Interventional Cardiology

## 2013-08-06 ENCOUNTER — Encounter: Payer: Self-pay | Admitting: Interventional Cardiology

## 2013-08-06 VITALS — BP 120/74 | HR 78 | Ht 69.5 in | Wt 230.0 lb

## 2013-08-06 DIAGNOSIS — N184 Chronic kidney disease, stage 4 (severe): Secondary | ICD-10-CM

## 2013-08-06 DIAGNOSIS — I4892 Unspecified atrial flutter: Secondary | ICD-10-CM

## 2013-08-06 DIAGNOSIS — I10 Essential (primary) hypertension: Secondary | ICD-10-CM

## 2013-08-06 DIAGNOSIS — Z7901 Long term (current) use of anticoagulants: Secondary | ICD-10-CM

## 2013-08-06 DIAGNOSIS — I5022 Chronic systolic (congestive) heart failure: Secondary | ICD-10-CM

## 2013-08-06 HISTORY — DX: Long term (current) use of anticoagulants: Z79.01

## 2013-08-06 HISTORY — DX: Chronic kidney disease, stage 4 (severe): N18.4

## 2013-08-06 NOTE — Telephone Encounter (Signed)
pt notified ok to continue dose of Eliquis 5mg  bid per Dr.Smith.pt verbalized understanding.

## 2013-08-06 NOTE — Progress Notes (Signed)
Patient ID: Eric [name], male   DOB: 12/28/1950, 62 y.o.   MRN: [id number]    [address], [address], [place]  [address] Phone: [phone] Fax:  [phone]  Date:  08/06/2013   ID:  Eric [name], DOB 12/17/1950, MRN [id number]  PCP:  Eric Greenland, MD   ASSESSMENT:  1. Atrial flutter, now off amiodarone. Rate control is undetermined. 2. Chronic kidney disease [name]-IV, may impact the dose of Eliquis 3. Chronic systolic heart failure, asymptomatic 4. Anticoagulation with NOAC, Eliquis 4. Hypertension  PLAN:  1. 24-hour Holter with the patient instructed to be fully active to determine if there is a rate control issue that could cause further deterioration in LV function 2. Maintain the anticoagulation and the current dosing 3. Aerobic activity as tolerated 4. Salt and caloric intake should be restricted. We discussed this measure and the importance of control.   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 Furthermore the heart failure is complicated by hypertension and mild aortic regurgitation. On therapy the patient's blood pressure isn't been under reasonable control. Despite amiodarone, atrial flutter has recurred. He saw Dr. Caryl [name] and the decision was made to follow the atrial flutter rather than ablate (the patient was reluctant to move forward with ablation). Now 9 weeks off of amiodarone, he feels well and notices no heart failure symptoms. He denies palpitations and tachycardia. There is no orthopnea, PND, or ankle swelling. He has not had bleeding on Eliquis.   Wt Readings from Last 3 Encounters:  08/06/13 230 lb (104.327 kg)  05/23/13 233 lb 12.8 oz (106.051 kg)  04/11/13 230 lb (104.327 kg)     Past Medical History  Diagnosis Date  . Claustrophobia   . Heart murmur   . Hypertension   . Varicose  vein of leg     right  . Dysrhythmia     "palpitations"  . Migraine 02/13/12    "opthalmic"  . Chronic lower back pain   . Exertional dyspnea 01/2012  . CHF (congestive heart failure)   . Hypothyroidism   . Chronic kidney disease     kidney fx studies increased     Current Outpatient Prescriptions  Medication Sig Dispense Refill  . apixaban (ELIQUIS) 5 MG TABS tablet Take by mouth 2 (two) times daily.      Marland Kitchen atorvastatin (LIPITOR) 20 MG tablet Take 1 tablet (20 mg total) by mouth daily.  90 tablet  3  . carvedilol (COREG) 25 MG tablet Take 25 mg by mouth 2 (two) times daily with a meal.      . docusate sodium (COLACE) 100 MG capsule Take 100-200 mg by mouth at bedtime as needed. For constipation      . ergocalciferol (VITAMIN D2) 50000 UNITS capsule Take 50,000 Units by mouth. Twice a week      . fluticasone (FLONASE) 50 MCG/ACT nasal spray Place 2 sprays into the nose as needed.       . furosemide (LASIX) 40 MG tablet Take 40 mg by mouth daily.      . isosorbide-hydrALAZINE (BIDIL) 20-37.5 MG per tablet Take 1 tablet by mouth 2 (two) times daily.      Marland Kitchen levothyroxine (SYNTHROID, LEVOTHROID) 75 MCG tablet Take 75 mcg by mouth daily before breakfast.      . magnesium oxide (MAG-OX) 400 MG tablet Take 400 mg by  mouth daily.      Marland Kitchen spironolactone (ALDACTONE) 25 MG tablet Take 25 mg by mouth daily.        No current facility-administered medications for this visit.    Allergies:    Allergies  Allergen Reactions  . Lactose Intolerance (Gi) Diarrhea    Social History:   History   Social History  . Marital Status: Married    Spouse Name: Eric [name]    Number of Children: 1  . Years of Education: N/A   Occupational History  .     Social History Main Topics  . Smoking status: Never Smoker   . Smokeless tobacco: Never Used  . Alcohol Use: Yes     Comment: 02/13/12 "socially"  . Drug Use: No  . Sexual Activity: Not Currently   Other Topics Concern  . 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      ROS:  Please see the history of present illness.    appetite is stable. No transient neurological symptoms. Working daily without difficulty. No chest pain.    All other systems reviewed and negative.   OBJECTIVE: VS:  BP 120/74  Pulse 78  Ht 5' 9.5" (1.765 m)  Wt 230 lb (104.327 kg)  BMI 33.49 kg/m2  SpO2 96% Well nourished, well developed, in no acute distress,  obese HEENT: normal Neck: JVD  flat . Carotid bruit  absent   Cardiac:  normal S1, S2; RRR; no murmur Lungs:  clear to auscultation bilaterally, no wheezing, rhonchi or rales Abd: soft, nontender, no hepatomegaly Ext: Edema  absent . Pulses  2+  Skin: warm and dry Neuro:  CNs 2-12 intact, no focal abnormalities noted  EKG:  Not repeated        Signed, Eric Labrador III, MD 08/06/2013 3:25 PM

## 2013-08-06 NOTE — Patient Instructions (Signed)
Your physician has recommended that you wear a holter monitor. Holter monitors are medical devices that record the heart's electrical activity. Doctors most often use these monitors to diagnose arrhythmias. Arrhythmias are problems with the speed or rhythm of the heartbeat. The monitor is a small, portable device. You can wear one while you do your normal daily activities. This is usually used to diagnose what is causing palpitations/syncope (passing out).   Your physician wants you to follow-up in: 6 months You will receive a reminder letter in the mail two months in advance. If you don't receive a letter, please call our office to schedule the follow-up appointment.  We will call you regarding your labs and possible medication adjustment

## 2013-08-11 ENCOUNTER — Other Ambulatory Visit: Payer: Self-pay | Admitting: Interventional Cardiology

## 2013-08-15 ENCOUNTER — Encounter: Payer: Self-pay | Admitting: Radiology

## 2013-08-15 ENCOUNTER — Encounter (INDEPENDENT_AMBULATORY_CARE_PROVIDER_SITE_OTHER): Payer: BC Managed Care – PPO

## 2013-08-15 ENCOUNTER — Other Ambulatory Visit: Payer: Self-pay

## 2013-08-15 DIAGNOSIS — I4892 Unspecified atrial flutter: Secondary | ICD-10-CM

## 2013-08-15 MED ORDER — LEVOTHYROXINE SODIUM 75 MCG PO TABS: 75.0000 ug | ORAL_TABLET | Freq: Every day | ORAL | Status: DC

## 2013-08-15 NOTE — Telephone Encounter (Signed)
Pt had appt today to have monitor placed and rqst refill for synthroid 0.075mg  sent to [facility] on [address]. done

## 2013-08-15 NOTE — Progress Notes (Signed)
Patient ID: Eric [name], male   DOB: 13-Aug-1951, 62 y.o.   MRN: [id number] E cardio 24hr holter monitor applied

## 2013-08-27 ENCOUNTER — Telehealth: Payer: Self-pay

## 2013-08-27 NOTE — Telephone Encounter (Signed)
pt given results of holter monitor.atrial flutter with controlled rate.averaging 67bpm.no changes at this tiime.pt verbalized understanding.

## 2013-09-07 ENCOUNTER — Other Ambulatory Visit: Payer: Self-pay | Admitting: Interventional Cardiology

## 2013-10-13 ENCOUNTER — Other Ambulatory Visit: Payer: Self-pay | Admitting: Interventional Cardiology

## 2013-11-13 ENCOUNTER — Other Ambulatory Visit: Payer: Self-pay | Admitting: Interventional Cardiology

## 2013-11-25 ENCOUNTER — Ambulatory Visit (INDEPENDENT_AMBULATORY_CARE_PROVIDER_SITE_OTHER): Payer: BC Managed Care – PPO

## 2013-11-25 VITALS — BP 148/83 | HR 74 | Resp 16

## 2013-11-25 DIAGNOSIS — M79609 Pain in unspecified limb: Secondary | ICD-10-CM

## 2013-11-25 DIAGNOSIS — S92919A Unspecified fracture of unspecified toe(s), initial encounter for closed fracture: Secondary | ICD-10-CM

## 2013-11-25 DIAGNOSIS — M79676 Pain in unspecified toe(s): Secondary | ICD-10-CM

## 2013-11-25 DIAGNOSIS — S9030XA Contusion of unspecified foot, initial encounter: Secondary | ICD-10-CM

## 2013-11-25 MED ORDER — TAVABOROLE 5 % EX SOLN: CUTANEOUS | Status: DC

## 2013-11-25 NOTE — Progress Notes (Signed)
   Subjective:    Patient ID: Eric [name], male    DOB: 1950-12-15, 63 y.o.   MRN: [id number]  HPI Comments: "I might have injured my toe, but it may just be ingrown, I don't know"  Patient c/o painful 3rd toe left foot since Sunday (2 days ago). He states that he banged his toes a lot walking around barefoot, but he does notice that the toenails looks ingrown as well. The are is red around the nail and the whole toe looks swollen. He took an aspirin this morning for pain and has been soaking and wrapping it in a bandage to stabilize toe.  Toe Pain       Review of Systems  Musculoskeletal: Positive for back pain and gait problem.  All other systems reviewed and are negative.       Objective:   Physical Exam Sherlynn Stalls status is intact with pedal pulses palpable DP and PT posterior were for Refill time 3 seconds skin temperature warm there is edema of the third toe left foot patient has a history of possible injury or bumping the toe. There is keratosis affecting the nails criptotic incurvated nails hallux through fifth digits bilateral there is tenderness in the nails although not as severe may provide information for topical antifungal some point in the future discuss that option begins literature about that's discussed the future. At this time x-rays reveal possible fracture of the proximal phalanx head third digit left foot with some possible subluxation dislocation at the IP joint. The digits otherwise rectus plus one +2 edema the digit is noted. At this time Coflex wrap in the digit buddy wrap in the second third and fourth toes is carried out maintain Coflex wrap in a Darco shoe dispensed to mobilize the toe for at least a 4 week duration       Assessment & Plan:  Assessment contusion/fracture third toe left foot fracture proximal phalanx. Maintain Darco shoe and Coflex wrap in [place] ice to the affected area Tylenol as the for pain consider topical antifungal for fungus nails in  the future if he continues have problems with the nails be K. for nail excision topical antifungal. Suggest followup in 4 weeks  Harriet Masson DPM

## 2013-11-27 ENCOUNTER — Telehealth: Payer: Self-pay | Admitting: *Deleted

## 2013-11-27 NOTE — Telephone Encounter (Signed)
Eric [name] scheduled the patient and left a message for him to come in tomorrow at 8:45am

## 2013-11-27 NOTE — Telephone Encounter (Addendum)
Pt's wife states pt is unable to walk on his foot, and would like to know how to get crutches, and if Dr Blenda Mounts would like to see him sooner.  I left a message informing pt he could get crutches from [facility] or any pharmacy.  I referred to schedulers for an appt with Dr Blenda Mounts on 11/25/2013.  Pt called at 1025am and states the pain is now in his entire foot and ankle.  I called [phone] at 1031am and left a message explaining Dr Blenda Mounts will be in the [facility] office 11/28/2013 and pt is scheduled for 0845am and to call and ask for me for more advice.  I spoke with pt he states the pain has begun [place] form 3rd toe to 1st toe and now into the ankle, he states he has a call into his internist for pain medication due to other health issues.  Pt states he will be in the office for his appt on 11/28/2013.

## 2013-11-27 NOTE — Telephone Encounter (Signed)
Based on the spread to adjacent digits and reviewing his x-rays once again this is a possibility of a gout attack. Suggest he follow up with his internist for pain medications would make recommendation for possible use of colchicine if he can be used in the presence of his kidney disease. Also suggested arthritis panel lab workup  include uric acid and sedimentation rate CBC differential etc. keep his followup appointment with me in the morning  Harriet Masson DPM

## 2013-11-28 ENCOUNTER — Ambulatory Visit (INDEPENDENT_AMBULATORY_CARE_PROVIDER_SITE_OTHER): Payer: BC Managed Care – PPO

## 2013-11-28 VITALS — BP 134/84 | HR 78 | Resp 16

## 2013-11-28 DIAGNOSIS — M109 Gout, unspecified: Secondary | ICD-10-CM

## 2013-11-28 DIAGNOSIS — R609 Edema, unspecified: Secondary | ICD-10-CM

## 2013-11-28 DIAGNOSIS — M79609 Pain in unspecified limb: Secondary | ICD-10-CM

## 2013-11-28 DIAGNOSIS — M79676 Pain in unspecified toe(s): Secondary | ICD-10-CM

## 2013-11-28 NOTE — Patient Instructions (Signed)

## 2013-11-28 NOTE — Progress Notes (Signed)
   Subjective:    Patient ID: Eric [name], male    DOB: 1951-06-05, 63 y.o.   MRN: [id number]  HPI Comments: "My foot has gotten much worse"  Follow up left foot  Foot Pain      Review of Systems no other changes other than exacerbation of foot pain.     Objective:   Physical Exam Vascular status is intact with pedal pulses palpable DP and PT bilateral and fibula left foot +2/4 there is Refill timed normal patient has edema of the general forefoot midfoot the pain has no involving all the toes MTP joints and extending to the dorsum of the foot and anterior ankle area. There is no severe increased temperature however patient and the ice packs his foot is gotten worse a shoes better he was seen by Dr. Roe Coombs his primary physician yesterday or the spoke on the phone and he was prescribe colchicine which he started taking yesterday as well as hydrocodone for pain. Will maintain coaxing twice daily for the next day or 2 and then once they for maintenance you to 24-hour should show some improvement. Patient is also given a gout diet at this time and a lap request we'll followup with Dr. Baird Cancer office for a CBC differential as well as arthritis panel including uric acid levels. There are no open wounds ulcerations no visits of any sign of infected process however due to his impaired renal status is highly likely this time is having a gout attack with slower recovery in response.       Assessment & Plan:  Possible acute gout attack exacerbating due to his renal status. Followup in one week for reevaluation dispensed gout diet initiate: Cold seen and hydrocodone for pain keep the foot warm and stabilized in the Darco shoe and comfortable socks avoid ice packs reappointed one week  Harriet Masson DPM

## 2013-12-02 ENCOUNTER — Other Ambulatory Visit: Payer: Self-pay | Admitting: *Deleted

## 2013-12-02 MED ORDER — TAVABOROLE 5 % EX SOLN: CUTANEOUS | Status: DC

## 2013-12-05 ENCOUNTER — Ambulatory Visit (INDEPENDENT_AMBULATORY_CARE_PROVIDER_SITE_OTHER): Payer: BC Managed Care – PPO

## 2013-12-05 VITALS — BP 100/58 | HR 77 | Resp 16

## 2013-12-05 DIAGNOSIS — M79609 Pain in unspecified limb: Secondary | ICD-10-CM

## 2013-12-05 DIAGNOSIS — R609 Edema, unspecified: Secondary | ICD-10-CM

## 2013-12-05 DIAGNOSIS — M109 Gout, unspecified: Secondary | ICD-10-CM

## 2013-12-05 DIAGNOSIS — M79676 Pain in unspecified toe(s): Secondary | ICD-10-CM

## 2013-12-05 NOTE — Patient Instructions (Signed)
Recommendations to follow the gout diet and stay well hydrated. Suggest discussing with primary physician a possible maintenance medication such as allopurinol or Uloric .   Suggest waiting 2 or 3 weeks from swelling and inflammation aside for going up to purchase new shoes

## 2013-12-05 NOTE — Progress Notes (Signed)
   Subjective:    Patient ID: [name], male    DOB: 13-Apr-1951, 63 y.o.   MRN: [id number]  HPI Comments: "Much better"  Follow up left foot pain  Foot Pain      Review of Systems 90 changes or findings     Objective:   Physical Exam Neurovascular status intact still some slight swelling of third toe left foot and some tenderness on range of motion of the hallux left arch greatly improved his uric acid level is elevated as was a CRP on testing it was done 3 primary physician. Suggested continue followup with primary he is taking colchicine One-A-Day at this time for will continue to do so my recommendation is to consider alternative maintenance medication such as allopurinol or uloric.       Assessment & Plan:  Assessment this time resolving are improving gouty attack left foot continue monitor maintaining Darco shoe switch to a gout diet and stay well hydrated followup with primary physician regarding Protocols possible switch to a maintenance medication other than colchicine.  Harriet Masson DPM

## 2013-12-14 ENCOUNTER — Other Ambulatory Visit: Payer: Self-pay | Admitting: Interventional Cardiology

## 2013-12-17 ENCOUNTER — Ambulatory Visit: Payer: BC Managed Care – PPO

## 2013-12-17 ENCOUNTER — Other Ambulatory Visit: Payer: Self-pay | Admitting: Interventional Cardiology

## 2013-12-27 ENCOUNTER — Encounter: Payer: Self-pay | Admitting: Interventional Cardiology

## 2014-02-04 ENCOUNTER — Other Ambulatory Visit: Payer: Self-pay | Admitting: Internal Medicine

## 2014-02-04 ENCOUNTER — Ambulatory Visit
Admission: RE | Admit: 2014-02-04 | Discharge: 2014-02-04 | Disposition: A | Discharge status: Home / Self Care | Payer: Self-pay | Source: Ambulatory Visit | Attending: Internal Medicine | Admitting: Internal Medicine

## 2014-02-04 DIAGNOSIS — R109 Unspecified abdominal pain: Secondary | ICD-10-CM

## 2014-02-13 ENCOUNTER — Other Ambulatory Visit: Payer: Self-pay | Admitting: Interventional Cardiology

## 2014-02-18 ENCOUNTER — Other Ambulatory Visit: Payer: Self-pay | Admitting: Interventional Cardiology

## 2014-04-20 ENCOUNTER — Other Ambulatory Visit: Payer: Self-pay | Admitting: Interventional Cardiology

## 2014-05-22 ENCOUNTER — Ambulatory Visit (INDEPENDENT_AMBULATORY_CARE_PROVIDER_SITE_OTHER): Payer: BC Managed Care – PPO | Admitting: Interventional Cardiology

## 2014-05-22 ENCOUNTER — Encounter: Payer: Self-pay | Admitting: Interventional Cardiology

## 2014-05-22 VITALS — BP 114/62 | HR 84 | Ht 69.5 in | Wt 232.0 lb

## 2014-05-22 DIAGNOSIS — I351 Nonrheumatic aortic (valve) insufficiency: Secondary | ICD-10-CM

## 2014-05-22 DIAGNOSIS — I4892 Unspecified atrial flutter: Secondary | ICD-10-CM

## 2014-05-22 DIAGNOSIS — I359 Nonrheumatic aortic valve disorder, unspecified: Secondary | ICD-10-CM

## 2014-05-22 DIAGNOSIS — I483 Typical atrial flutter: Secondary | ICD-10-CM

## 2014-05-22 DIAGNOSIS — I5022 Chronic systolic (congestive) heart failure: Secondary | ICD-10-CM

## 2014-05-22 DIAGNOSIS — N184 Chronic kidney disease, stage 4 (severe): Secondary | ICD-10-CM

## 2014-05-22 DIAGNOSIS — I10 Essential (primary) hypertension: Secondary | ICD-10-CM

## 2014-05-22 NOTE — Patient Instructions (Signed)
Your physician wants you to follow-up in: 6 MONTHS with Dr Tamala Julian.  You will receive a reminder letter in the mail two months in advance. If you don't receive a letter, please call our office to schedule the follow-up appointment.  Your physician recommends that you continue on your current medications as directed. Please refer to the Current Medication list given to you today.

## 2014-05-22 NOTE — Progress Notes (Signed)
Patient ID: Eric [name], male   DOB: 07/11/1951, 63 y.o.   MRN: [id number]    [address], [address] [place], [place]  [address] Phone: [phone] Fax:  [phone]  Date:  05/22/2014   ID:  Eric [name], DOB [birth year], MRN [id number]  PCP:  [name], MD   ASSESSMENT:  1. Atrial flutter with rate control. Ablation refused by the patient. Holter monitor documented rate control in November 2014, 2 half months after amiodarone discontinued 2. Combined systolic and diastolic heart failure, stable with most recent LVEF 40% in late 2014 3. Chronic anticoagulation without complications 4. Chronic kidney disease, stage III-IV, stable with recent creatinine 1.9. Followed by nephrology 5. Blood pressure controlled  PLAN:  1. Continue the current medical regimen. 2. Notify us of dyspnea or edema 3. Call if palpitations, syncope, or recurrent dizziness 4. Consider repeating echocardiogram in 2016, spurring 5. Clinical followup in 6 months   SUBJECTIVE: Eric [name] is a 63 y.o. male is doing well from cardiac standpoint. He has not been afflicted by gallop recurrences. This is been managed by Dr. Baird Cancer. He denies orthopnea and PND. Dr. Florene Glen has been following renal function. Last creatinine on my records as 1.9. He has not had syncope. He denies any chest discomfort. No significant palpitations. Overall exertional tolerance is been stable.   Wt Readings from Last 3 Encounters:  05/22/14 232 lb (105.235 kg)  08/06/13 230 lb (104.327 kg)  05/23/13 233 lb 12.8 oz (106.051 kg)     Past Medical History  Diagnosis Date  . Claustrophobia   . Heart murmur   . Hypertension   . Varicose vein of leg     right  . Dysrhythmia     "palpitations"  . Migraine 02/13/12    "opthalmic"  . Chronic lower back pain   . Exertional dyspnea 01/2012  . CHF (congestive heart failure)   . Hypothyroidism   . Chronic kidney disease     kidney fx studies increased     Current  Outpatient Prescriptions  Medication Sig Dispense Refill  . atorvastatin (LIPITOR) 20 MG tablet Take 1 tablet (20 mg total) by mouth daily.  90 tablet  3  . carvedilol (COREG) 25 MG tablet TAKE 1 TABLET BY MOUTH TWICE DAILY  60 tablet  1  . colchicine 0.6 MG tablet Take 0.6 mg by mouth daily.      Marland Kitchen ELIQUIS 5 MG TABS tablet TAKE 1 TABLET BY MOUTH TWICE DAILY  60 tablet  0  . ergocalciferol (VITAMIN D2) 50000 UNITS capsule Take 50,000 Units by mouth. Twice a week      . fluticasone (FLONASE) 50 MCG/ACT nasal spray Place 2 sprays into the nose as needed.       . furosemide (LASIX) 40 MG tablet TAKE 1 TABLET BY MOUTH DAILY  90 tablet  0  . isosorbide-hydrALAZINE (BIDIL) 20-37.5 MG per tablet Take 1 tablet by mouth 2 (two) times daily.      Marland Kitchen levothyroxine (SYNTHROID, LEVOTHROID) 75 MCG tablet Take 1 tablet (75 mcg total) by mouth daily before breakfast.  30 tablet  11  . magnesium oxide (MAG-OX) 400 MG tablet Take 400 mg by mouth daily.      Marland Kitchen spironolactone (ALDACTONE) 25 MG tablet TAKE 1 TABLET BY MOUTH DAILY  30 tablet  1  . Tavaborole (KERYDIN) 5 % SOLN Apply 1 drop to each affected nail once daily for 12 months duration.  10 mL  11  No current facility-administered medications for this visit.    Allergies:    Allergies  Allergen Reactions  . Lactose Intolerance (Gi) Diarrhea    Social History:  The patient  reports that he has never smoked. He has never used smokeless tobacco. He reports that he drinks alcohol. He reports that he does not use illicit drugs.   ROS:  Please see the history of present illness.   Appetite is been stable. No transient neurological complaints. No bleeding on anticoagulation.   All other systems reviewed and negative.   OBJECTIVE: VS:  BP 114/62  Pulse 84  Ht 5' 9.5" (1.765 m)  Wt 232 lb (105.235 kg)  BMI 33.78 kg/m2 Well nourished, well developed, in no acute distress, compatible with stated age HEENT: normal Neck: JVD flat. Carotid bruit absent    Cardiac:  normal S1, S2; RRR; no murmur. Soft gallop is heard. Lungs:  clear to auscultation bilaterally, no wheezing, rhonchi or rales Abd: soft, nontender, no hepatomegaly Ext: Edema absent. Pulses 2+ and symmetric Skin: warm and dry Neuro:  CNs 2-12 intact, no focal abnormalities noted  EKG:  Not repeated       Signed, Illene Labrador III, MD 05/22/2014 12:30 PM

## 2014-05-27 ENCOUNTER — Other Ambulatory Visit: Payer: Self-pay | Admitting: Interventional Cardiology

## 2014-06-20 ENCOUNTER — Other Ambulatory Visit: Payer: Self-pay | Admitting: Interventional Cardiology

## 2014-07-09 ENCOUNTER — Other Ambulatory Visit: Payer: Self-pay | Admitting: Interventional Cardiology

## 2014-08-29 ENCOUNTER — Encounter: Payer: Self-pay | Admitting: Interventional Cardiology

## 2014-08-30 ENCOUNTER — Other Ambulatory Visit: Payer: Self-pay | Admitting: Interventional Cardiology

## 2014-09-16 ENCOUNTER — Other Ambulatory Visit: Payer: Self-pay | Admitting: Interventional Cardiology

## 2014-12-16 ENCOUNTER — Encounter: Payer: Self-pay | Admitting: Interventional Cardiology

## 2015-01-01 ENCOUNTER — Ambulatory Visit (INDEPENDENT_AMBULATORY_CARE_PROVIDER_SITE_OTHER): Payer: BC Managed Care – PPO | Admitting: Interventional Cardiology

## 2015-01-01 ENCOUNTER — Encounter: Payer: Self-pay | Admitting: Interventional Cardiology

## 2015-01-01 VITALS — BP 108/74 | HR 67 | Ht 69.5 in | Wt 239.0 lb

## 2015-01-01 DIAGNOSIS — Z7901 Long term (current) use of anticoagulants: Secondary | ICD-10-CM

## 2015-01-01 DIAGNOSIS — I5022 Chronic systolic (congestive) heart failure: Secondary | ICD-10-CM

## 2015-01-01 DIAGNOSIS — N184 Chronic kidney disease, stage 4 (severe): Secondary | ICD-10-CM

## 2015-01-01 DIAGNOSIS — I484 Atypical atrial flutter: Secondary | ICD-10-CM | POA: Diagnosis not present

## 2015-01-01 DIAGNOSIS — E785 Hyperlipidemia, unspecified: Secondary | ICD-10-CM

## 2015-01-01 DIAGNOSIS — I351 Nonrheumatic aortic (valve) insufficiency: Secondary | ICD-10-CM

## 2015-01-01 DIAGNOSIS — I10 Essential (primary) hypertension: Secondary | ICD-10-CM

## 2015-01-01 DIAGNOSIS — I77819 Aortic ectasia, unspecified site: Secondary | ICD-10-CM

## 2015-01-01 DIAGNOSIS — I7789 Other specified disorders of arteries and arterioles: Secondary | ICD-10-CM

## 2015-01-01 NOTE — Patient Instructions (Signed)
Your physician recommends that you continue on your current medications as directed. Please refer to the Current Medication list given to you today.  Monitor your caloric intake  Your physician wants you to follow-up in: 9 months with Dr.Smith You will receive a reminder letter in the mail two months in advance. If you don't receive a letter, please call our office to schedule the follow-up appointment.

## 2015-01-01 NOTE — Progress Notes (Signed)
Cardiology Office Note   Date:  01/01/2015   ID:  Eric [name], DOB 06-Jan-1951, MRN [id number]  PCP:  Eric Greenland, MD  Cardiologist:   Eric Grooms, MD   No chief complaint on file.     History of Present Illness: Eric [name] is a 64 y.o. male who presents for congestive heart failure, chronic atrial fibrillation, essential hypertension, and chronic anticoagulation. He has gained weight. He is not short of breath or having chest discomfort. He denies edema. He is taking medications as prescribed. He complains that on occasion he feels extremely fatigued after taking his morning medications. He has not had syncope. He has had no blood in the urine or stool.    Past Medical History  Diagnosis Date  . Claustrophobia   . Heart murmur   . Hypertension   . Varicose vein of leg     right  . Dysrhythmia     "palpitations"  . Migraine 02/13/12    "opthalmic"  . Chronic lower back pain   . Exertional dyspnea 01/2012  . CHF (congestive heart failure)   . Hypothyroidism   . Chronic kidney disease     kidney fx studies increased     Past Surgical History  Procedure Laterality Date  . Skin melanocytoma excision  2012    "above left clavicle"  . Finger surgery  2012    "4th digit right hand; thumb on left hand"  . Tee without cardioversion  03/22/2012    Procedure: TRANSESOPHAGEAL ECHOCARDIOGRAM (TEE);  Surgeon: Eric Furbish, MD;  Location: [facility] ENDOSCOPY;  Service: Cardiovascular;  Laterality: N/A;  . Cardioversion  03/22/2012    Procedure: CARDIOVERSION;  Surgeon: Eric Furbish, MD;  Location: [place];  Service: Cardiovascular;  Laterality: N/A;  . Cardioversion  04/19/2012    Procedure: CARDIOVERSION;  Surgeon: Eric Grooms, MD;  Location: [place];  Service: Cardiovascular;  Laterality: N/A;  . Colonoscopy N/A 04/11/2013    Procedure: COLONOSCOPY;  Surgeon: Eric Castle, MD;  Location: [facility] ENDOSCOPY;  Service: Endoscopy;  Laterality: N/A;  . Colonoscopy N/A  04/11/2013    Procedure: COLONOSCOPY;  Surgeon: Eric Castle, MD;  Location: [facility] ENDOSCOPY;  Service: Endoscopy;  Laterality: N/A;     Current Outpatient Prescriptions  Medication Sig Dispense Refill  . atorvastatin (LIPITOR) 20 MG tablet TAKE 1 TABLET BY MOUTH DAILY 90 tablet 0  . b complex vitamins tablet Take 1 tablet by mouth daily as needed.    . carvedilol (COREG) 25 MG tablet TAKE 1 TABLET BY MOUTH TWICE DAILY 60 tablet 5  . colchicine 0.6 MG tablet Take 0.6 mg by mouth daily.    Marland Kitchen ELIQUIS 5 MG TABS tablet TAKE 1 TABLET BY MOUTH TWICE DAILY 60 tablet 6  . ergocalciferol (VITAMIN D2) 50000 UNITS capsule Take 50,000 Units by mouth. Twice a week    . furosemide (LASIX) 40 MG tablet TAKE 1 TABLET BY MOUTH DAILY 90 tablet 1  . isosorbide-hydrALAZINE (BIDIL) 20-37.5 MG per tablet Take 1 tablet by mouth 2 (two) times daily.    Marland Kitchen levothyroxine (SYNTHROID, LEVOTHROID) 75 MCG tablet Take 1 tablet (75 mcg total) by mouth daily before breakfast. 30 tablet 11  . magnesium oxide (MAG-OX) 400 MG tablet Take 400 mg by mouth daily.    Marland Kitchen spironolactone (ALDACTONE) 25 MG tablet TAKE 1 TABLET BY MOUTH EVERY DAY 30 tablet 6  . fluticasone (FLONASE) 50 MCG/ACT nasal spray Place 2 sprays into the nose as needed.  No current facility-administered medications for this visit.    Allergies:   Lactose intolerance (gi)    Social History:  The patient  reports that he has never smoked. He has never used smokeless tobacco. He reports that he drinks alcohol. He reports that he does not use illicit drugs.   Family History:  The patient's family history includes Diabetes in his mother; Heart disease in his father and mother; Heart failure in his father, mother, and mother; Hypertension in his father and mother.    ROS:  Please see the history of present illness.   Otherwise, review of systems are positive for weight gain and sedentary life style .   All other systems are reviewed and negative.     PHYSICAL EXAM: VS:  BP 108/74 mmHg  Pulse 67  Ht 5' 9.5" (1.765 m)  Wt 239 lb (108.41 kg)  BMI 34.80 kg/m2 , BMI Body mass index is 34.8 kg/(m^2). GEN: Well nourished, well developed, in no acute distress HEENT: normal Neck: no JVD, carotid bruits, or masses Cardiac: IIRR; no murmurs, rubs, or gallops,no edema  Respiratory:  clear to auscultation bilaterally, normal work of breathing GI: soft, nontender, nondistended, + BS MS: no deformity or atrophy Skin: warm and dry, no rash Neuro:  Strength and sensation are intact Psych: euthymic mood, full affect   EKG:  EKG is ordered today. The ekg ordered today demonstrates atrial fib with controlled rate and LVH   Recent Labs: No results found for requested labs within last 365 days.    Lipid Panel    Component Value Date/Time   CHOL 161 02/13/2012 1829   TRIG 151* 02/13/2012 1829   HDL 42 02/13/2012 1829   CHOLHDL 3.8 02/13/2012 1829   VLDL 30 02/13/2012 1829   LDLCALC 89 02/13/2012 1829      Wt Readings from Last 3 Encounters:  01/01/15 239 lb (108.41 kg)  05/22/14 232 lb (105.235 kg)  08/06/13 230 lb (104.327 kg)      Other studies Reviewed: Additional studies/ records that were reviewed today include: . Review of the above records demonstrates:  Reviewed lbs from Dr. Baird [name]   ASSESSMENT AND PLAN:  Chronic systolic heart failure: euvolemic  Atrial fibrillation with controlled rate  Hypertension, essential: controlled  CKD (chronic kidney disease), stage IV  Aortic valve regurgitation, acquired: Clinically mild  Aortic root enlargement   Chronic anticoagulatiion : no bleeding     Current medicines are reviewed at length with the patient today.  The patient does not have concerns regarding medicines.  The following changes have been made:  no change  Labs/ tests ordered today include:   Orders Placed This Encounter  Procedures  . EKG 12-Lead     Disposition:   FU with Eric [name] in 8  months   Signed, Eric Grooms, MD  01/01/2015 4:54 PM    [facility] [address], [place], [place]  [address] Phone: [phone]; Fax: [phone]

## 2015-02-09 ENCOUNTER — Other Ambulatory Visit: Payer: Self-pay | Admitting: Interventional Cardiology

## 2015-02-26 ENCOUNTER — Other Ambulatory Visit: Payer: Self-pay | Admitting: Interventional Cardiology

## 2015-04-06 ENCOUNTER — Encounter: Payer: Self-pay | Admitting: Interventional Cardiology

## 2015-04-06 ENCOUNTER — Telehealth: Payer: Self-pay

## 2015-04-06 NOTE — Telephone Encounter (Signed)
-----   Message from Belva Crome, MD sent at 04/06/2015  1:31 PM EDT ----- Labs are pretty good but A1 C and Creat are elevated (stable)

## 2015-05-09 ENCOUNTER — Other Ambulatory Visit: Payer: Self-pay | Admitting: Interventional Cardiology

## 2015-10-17 ENCOUNTER — Other Ambulatory Visit: Payer: Self-pay | Admitting: Interventional Cardiology

## 2015-10-18 ENCOUNTER — Other Ambulatory Visit: Payer: Self-pay | Admitting: *Deleted

## 2015-10-18 MED ORDER — CARVEDILOL 25 MG PO TABS: 25.0000 mg | ORAL_TABLET | Freq: Two times a day (BID) | ORAL | Status: DC

## 2015-10-29 ENCOUNTER — Encounter: Payer: Self-pay | Admitting: Interventional Cardiology

## 2015-11-16 ENCOUNTER — Other Ambulatory Visit: Payer: Self-pay | Admitting: Interventional Cardiology

## 2015-11-21 ENCOUNTER — Other Ambulatory Visit: Payer: Self-pay | Admitting: Interventional Cardiology

## 2015-12-17 ENCOUNTER — Other Ambulatory Visit: Payer: Self-pay | Admitting: Interventional Cardiology

## 2015-12-31 ENCOUNTER — Encounter: Payer: Self-pay | Admitting: Interventional Cardiology

## 2015-12-31 ENCOUNTER — Ambulatory Visit (INDEPENDENT_AMBULATORY_CARE_PROVIDER_SITE_OTHER): Payer: BC Managed Care – PPO | Admitting: Interventional Cardiology

## 2015-12-31 VITALS — BP 156/82 | HR 59 | Ht 69.0 in | Wt 242.1 lb

## 2015-12-31 DIAGNOSIS — N184 Chronic kidney disease, stage 4 (severe): Secondary | ICD-10-CM

## 2015-12-31 DIAGNOSIS — I484 Atypical atrial flutter: Secondary | ICD-10-CM

## 2015-12-31 DIAGNOSIS — I10 Essential (primary) hypertension: Secondary | ICD-10-CM

## 2015-12-31 DIAGNOSIS — I5022 Chronic systolic (congestive) heart failure: Secondary | ICD-10-CM

## 2015-12-31 DIAGNOSIS — I351 Nonrheumatic aortic (valve) insufficiency: Secondary | ICD-10-CM

## 2015-12-31 DIAGNOSIS — E785 Hyperlipidemia, unspecified: Secondary | ICD-10-CM

## 2015-12-31 DIAGNOSIS — I7789 Other specified disorders of arteries and arterioles: Secondary | ICD-10-CM

## 2015-12-31 NOTE — Progress Notes (Signed)
Cardiology Office Note   Date:  12/31/2015   ID:  Eric [name], DOB January 06, 1951, MRN [id number]  PCP:  [name], MD  Cardiologist:  Sinclair Grooms, MD   Chief Complaint  Patient presents with  . Congestive Heart Failure      History of Present Illness: Eric [name] is a 65 y.o. male who presents for Follow-up of systolic heart failure, atrial fibrillation, anticoagulation therapy, hypertension, CK D stage III, and intermittent exertional chest discomfort.  I last seen [name] in approximately a year. He has gained some weight since the last office visit. Otherwise he feels well. He has no discomfort with activity other than early in the mornings if he rushes after eating breakfast. All those instances he gets mild tightness in the chest and goes away with rest. This is been relatively stable over time. He has not ED use nitroglycerin. He denies orthopnea PND. There is no peripheral edema. He has not had syncope. He has had no blood in his urine and stool.    Past Medical History  Diagnosis Date  . Claustrophobia   . Heart murmur   . Hypertension   . Varicose vein of leg     right  . Dysrhythmia     "palpitations"  . Migraine 02/13/12    "opthalmic"  . Chronic lower back pain   . Exertional dyspnea 01/2012  . CHF (congestive heart failure) ([place])   . Hypothyroidism   . Chronic kidney disease     kidney fx studies increased     Past Surgical History  Procedure Laterality Date  . Skin melanocytoma excision  2012    "above left clavicle"  . Finger surgery  2012    "4th digit right hand; thumb on left hand"  . Tee without cardioversion  03/22/2012    Procedure: TRANSESOPHAGEAL ECHOCARDIOGRAM (TEE);  Surgeon: Candee Furbish, MD;  Location: [facility] ENDOSCOPY;  Service: Cardiovascular;  Laterality: N/A;  . Cardioversion  03/22/2012    Procedure: CARDIOVERSION;  Surgeon: Candee Furbish, MD;  Location: [place];  Service: Cardiovascular;  Laterality: N/A;  .  Cardioversion  04/19/2012    Procedure: CARDIOVERSION;  Surgeon: Sinclair Grooms, MD;  Location: [place];  Service: Cardiovascular;  Laterality: N/A;  . Colonoscopy N/A 04/11/2013    Procedure: COLONOSCOPY;  Surgeon: Inda Castle, MD;  Location: [facility] ENDOSCOPY;  Service: Endoscopy;  Laterality: N/A;  . Colonoscopy N/A 04/11/2013    Procedure: COLONOSCOPY;  Surgeon: Inda Castle, MD;  Location: [facility] ENDOSCOPY;  Service: Endoscopy;  Laterality: N/A;     Current Outpatient Prescriptions  Medication Sig Dispense Refill  . atorvastatin (LIPITOR) 20 MG tablet TAKE 1 TABLET BY MOUTH DAILY 90 tablet 0  . b complex vitamins tablet Take 1 tablet by mouth daily as needed (cold/ energy).     . carvedilol (COREG) 25 MG tablet TAKE 1 TABLET BY MOUTH TWICE DAILY 60 tablet 0  . Cholecalciferol 2000 units CAPS Take 2,000 Units by mouth 3 (three) times a week.    . Coenzyme Q10 (COQ10) 200 MG CAPS Take 200 mg by mouth daily.    Marland Kitchen ELIQUIS 5 MG TABS tablet TAKE 1 TABLET BY MOUTH TWICE DAILY 60 tablet 11  . fluticasone (FLONASE) 50 MCG/ACT nasal spray Place 2 sprays into the nose as needed for allergies.     . furosemide (LASIX) 40 MG tablet TAKE 1 TABLET BY MOUTH DAILY 90 tablet 0  . isosorbide-hydrALAZINE (BIDIL) 20-37.5 MG per  tablet Take 1 tablet by mouth 2 (two) times daily.    Marland Kitchen levothyroxine (SYNTHROID, LEVOTHROID) 75 MCG tablet Take 1 tablet (75 mcg total) by mouth daily before breakfast. 30 tablet 11  . magnesium oxide (MAG-OX) 400 MG tablet Take 400 mg by mouth daily.    Marland Kitchen spironolactone (ALDACTONE) 25 MG tablet TAKE 1 TABLET BY MOUTH EVERY DAY 30 tablet 0   No current facility-administered medications for this visit.    Allergies:   Lactose intolerance (gi)    Social History:  The patient  reports that he has never smoked. He has never used smokeless tobacco. He reports that he drinks alcohol. He reports that he does not use illicit drugs.   Family History:  The patient's family history includes  Diabetes in his mother; Heart disease in his father and mother; Heart failure in his father, mother, and mother; Hypertension in his father and mother.    ROS:  Please see the history of present illness.   Otherwise, review of systems are positive for Irregular heartbeat. Weight gain..   All other systems are reviewed and negative.    PHYSICAL EXAM: VS:  BP 156/82 mmHg  Pulse 59  Ht 5\' 9"  (1.753 m)  Wt 242 lb 1.9 oz (109.825 kg)  BMI 35.74 kg/m2 , BMI Body mass index is 35.74 kg/(m^2). GEN: Well nourished, well developed, in no acute distress HEENT: normal Neck: no JVD, carotid bruits, or masses Cardiac: IIRR.  There is 1 to 2/6 decrescendo murmur of aortic regurgitation. This murmur does not changed in intensity or quality since last auscultation. There is no rub, or gallop. There is no edema. Respiratory:  clear to auscultation bilaterally, normal work of breathing. GI: soft, nontender, nondistended, + BS MS: no deformity or atrophy Skin: warm and dry, no rash Neuro:  Strength and sensation are intact Psych: euthymic mood, full affect   EKG:  EKG is ordered today. The ekg reveals atrial fibrillation with left bundle branch block and QRS duration 158 ms.   Recent Labs: No results found for requested labs within last 365 days.    Lipid Panel    Component Value Date/Time   CHOL 161 02/13/2012 1829   TRIG 151* 02/13/2012 1829   HDL 42 02/13/2012 1829   CHOLHDL 3.8 02/13/2012 1829   VLDL 30 02/13/2012 1829   LDLCALC 89 02/13/2012 1829      Wt Readings from Last 3 Encounters:  12/31/15 242 lb 1.9 oz (109.825 kg)  01/01/15 239 lb (108.41 kg)  05/22/14 232 lb (105.235 kg)      Other studies Reviewed: Additional studies/ records that were reviewed today include: Review of prior echo.. The findings include review of recent laboratory data from Dr Bryon Lions. Creatinine 2.2, A1c 6.9, blood sugar 140..    ASSESSMENT AND PLAN:  1. Chronic systolic heart failure  (HCC) No symptoms of volume overload. No clinical evidence of volume overload.  2. Aortic valve regurgitation, acquired Mild aortic regurgitation  3. Atypical atrial flutter (HCC) Currently in atrial fibrillation with controlled rate  4. Hypertension, accelerated Elevated systolic pressure in the [id number] range. He has not had furosemide yet today.  5. Aortic root enlargement (HCC) Status unknown  6. CKD (chronic kidney disease), stage IV (HCC) Stage III-IV CK D  7. Hyperlipidemia Lipids were excellent when assessed by Dr. Baird Cancer with LDL around 80.  8. Left bundle branch block with QRS duration 158 12/31/2015 and 172 in 2013, July 26.   Current medicines are reviewed  at length with the patient today.  The patient has the following concerns regarding medicines: He is on a good strong medical regimen..  The following changes/actions have been instituted:    2-D Doppler echocardiogram to assess LV function  If LV function is decreasing consider resynchronization therapy  Aerobic activity  Weight loss  If he develops any exertional discomfort with increasing activity, consider ischemic evaluation  Recorded at least 2 blood pressures per week for the next 3 weeks and report.  Labs/ tests ordered today include:  No orders of the defined types were placed in this encounter.     Disposition:   FU with HS in 1 year  Signed, Sinclair Grooms, MD  12/31/2015 3:44 PM    [address], [place], [place]  [address] Phone: [phone]; Fax: [phone]

## 2015-12-31 NOTE — Patient Instructions (Signed)
Medication Instructions:  Your physician recommends that you continue on your current medications as directed. Please refer to the Current Medication list given to you today.   Labwork: None   Testing/Procedures: Your physician has requested that you have an echocardiogram. Echocardiography is a painless test that uses sound waves to create images of your heart. It provides your doctor with information about the size and shape of your heart and how well your heart's chambers and valves are working. This procedure takes approximately one hour. There are no restrictions for this procedure.    Follow-Up: Your physician wants you to follow-up in: 1 year with Dr Tamala Julian. (April 2018). You will receive a reminder letter in the mail two months in advance. If you don't receive a letter, please call our office to schedule the follow-up appointment.   Any Other Special Instructions Will Be Listed Below (If Applicable). Your physician has requested that you regularly monitor and record your blood pressure readings at home.  Please check  your blood pressure at least 2 times a week for 3 weeks. Please check your blood pressure at least 10 times in the next 3 weeks.  Call our office in about 3 weeks to report your blood pressures.   Your physician discussed the importance of regular exercise and recommended that you start or continue a regular exercise program for good health.        If you need a refill on your cardiac medications before your next appointment, please call your pharmacy.

## 2016-01-18 ENCOUNTER — Other Ambulatory Visit: Payer: Self-pay | Admitting: Interventional Cardiology

## 2016-01-19 ENCOUNTER — Other Ambulatory Visit: Payer: Self-pay

## 2016-01-19 ENCOUNTER — Ambulatory Visit (HOSPITAL_COMMUNITY): Payer: BC Managed Care – PPO | Attending: Cardiovascular Disease

## 2016-01-19 DIAGNOSIS — I7789 Other specified disorders of arteries and arterioles: Secondary | ICD-10-CM | POA: Diagnosis not present

## 2016-01-19 DIAGNOSIS — E785 Hyperlipidemia, unspecified: Secondary | ICD-10-CM | POA: Diagnosis not present

## 2016-01-19 DIAGNOSIS — I5022 Chronic systolic (congestive) heart failure: Secondary | ICD-10-CM | POA: Insufficient documentation

## 2016-01-19 DIAGNOSIS — I351 Nonrheumatic aortic (valve) insufficiency: Secondary | ICD-10-CM | POA: Insufficient documentation

## 2016-01-19 DIAGNOSIS — I484 Atypical atrial flutter: Secondary | ICD-10-CM | POA: Insufficient documentation

## 2016-01-19 DIAGNOSIS — N184 Chronic kidney disease, stage 4 (severe): Secondary | ICD-10-CM | POA: Insufficient documentation

## 2016-01-19 DIAGNOSIS — Z6835 Body mass index (BMI) 35.0-35.9, adult: Secondary | ICD-10-CM | POA: Insufficient documentation

## 2016-01-19 DIAGNOSIS — E669 Obesity, unspecified: Secondary | ICD-10-CM | POA: Insufficient documentation

## 2016-01-19 DIAGNOSIS — I10 Essential (primary) hypertension: Secondary | ICD-10-CM | POA: Diagnosis not present

## 2016-01-19 DIAGNOSIS — I130 Hypertensive heart and chronic kidney disease with heart failure and stage 1 through stage 4 chronic kidney disease, or unspecified chronic kidney disease: Secondary | ICD-10-CM | POA: Insufficient documentation

## 2016-01-31 ENCOUNTER — Emergency Department (HOSPITAL_COMMUNITY): Payer: BC Managed Care – PPO

## 2016-01-31 ENCOUNTER — Inpatient Hospital Stay (HOSPITAL_COMMUNITY)
Admission: EM | Admit: 2016-01-31 | Discharge: 2016-02-22 | DRG: 224 | Disposition: A | Discharge status: Rehabilitation Facility | Payer: BC Managed Care – PPO | Attending: Internal Medicine | Admitting: Internal Medicine

## 2016-01-31 DIAGNOSIS — M545 Low back pain, unspecified: Secondary | ICD-10-CM | POA: Diagnosis present

## 2016-01-31 DIAGNOSIS — M549 Dorsalgia, unspecified: Secondary | ICD-10-CM

## 2016-01-31 DIAGNOSIS — I5022 Chronic systolic (congestive) heart failure: Secondary | ICD-10-CM | POA: Diagnosis present

## 2016-01-31 DIAGNOSIS — N184 Chronic kidney disease, stage 4 (severe): Secondary | ICD-10-CM | POA: Diagnosis present

## 2016-01-31 DIAGNOSIS — G5720 Lesion of femoral nerve, unspecified lower limb: Secondary | ICD-10-CM | POA: Insufficient documentation

## 2016-01-31 DIAGNOSIS — I499 Cardiac arrhythmia, unspecified: Secondary | ICD-10-CM

## 2016-01-31 DIAGNOSIS — D72829 Elevated white blood cell count, unspecified: Secondary | ICD-10-CM

## 2016-01-31 DIAGNOSIS — I4901 Ventricular fibrillation: Secondary | ICD-10-CM

## 2016-01-31 DIAGNOSIS — J690 Pneumonitis due to inhalation of food and vomit: Secondary | ICD-10-CM | POA: Diagnosis present

## 2016-01-31 DIAGNOSIS — D62 Acute posthemorrhagic anemia: Secondary | ICD-10-CM | POA: Diagnosis not present

## 2016-01-31 DIAGNOSIS — I480 Paroxysmal atrial fibrillation: Secondary | ICD-10-CM | POA: Diagnosis present

## 2016-01-31 DIAGNOSIS — Z85828 Personal history of other malignant neoplasm of skin: Secondary | ICD-10-CM

## 2016-01-31 DIAGNOSIS — E162 Hypoglycemia, unspecified: Secondary | ICD-10-CM | POA: Diagnosis not present

## 2016-01-31 DIAGNOSIS — Z79899 Other long term (current) drug therapy: Secondary | ICD-10-CM

## 2016-01-31 DIAGNOSIS — E785 Hyperlipidemia, unspecified: Secondary | ICD-10-CM | POA: Diagnosis present

## 2016-01-31 DIAGNOSIS — R001 Bradycardia, unspecified: Secondary | ICD-10-CM | POA: Diagnosis present

## 2016-01-31 DIAGNOSIS — F40240 Claustrophobia: Secondary | ICD-10-CM | POA: Diagnosis present

## 2016-01-31 DIAGNOSIS — I5042 Chronic combined systolic (congestive) and diastolic (congestive) heart failure: Secondary | ICD-10-CM | POA: Diagnosis present

## 2016-01-31 DIAGNOSIS — I719 Aortic aneurysm of unspecified site, without rupture: Secondary | ICD-10-CM | POA: Diagnosis present

## 2016-01-31 DIAGNOSIS — I130 Hypertensive heart and chronic kidney disease with heart failure and stage 1 through stage 4 chronic kidney disease, or unspecified chronic kidney disease: Secondary | ICD-10-CM | POA: Diagnosis present

## 2016-01-31 DIAGNOSIS — R58 Hemorrhage, not elsewhere classified: Secondary | ICD-10-CM | POA: Diagnosis present

## 2016-01-31 DIAGNOSIS — I469 Cardiac arrest, cause unspecified: Secondary | ICD-10-CM

## 2016-01-31 DIAGNOSIS — I4892 Unspecified atrial flutter: Secondary | ICD-10-CM | POA: Diagnosis present

## 2016-01-31 DIAGNOSIS — E669 Obesity, unspecified: Secondary | ICD-10-CM | POA: Diagnosis present

## 2016-01-31 DIAGNOSIS — K661 Hemoperitoneum: Secondary | ICD-10-CM | POA: Diagnosis present

## 2016-01-31 DIAGNOSIS — R4182 Altered mental status, unspecified: Secondary | ICD-10-CM | POA: Diagnosis present

## 2016-01-31 DIAGNOSIS — R1313 Dysphagia, pharyngeal phase: Secondary | ICD-10-CM | POA: Diagnosis present

## 2016-01-31 DIAGNOSIS — I481 Persistent atrial fibrillation: Secondary | ICD-10-CM | POA: Diagnosis present

## 2016-01-31 DIAGNOSIS — R491 Aphonia: Secondary | ICD-10-CM | POA: Diagnosis not present

## 2016-01-31 DIAGNOSIS — Z6835 Body mass index (BMI) 35.0-35.9, adult: Secondary | ICD-10-CM

## 2016-01-31 DIAGNOSIS — I484 Atypical atrial flutter: Secondary | ICD-10-CM | POA: Diagnosis not present

## 2016-01-31 DIAGNOSIS — I472 Ventricular tachycardia: Principal | ICD-10-CM | POA: Diagnosis present

## 2016-01-31 DIAGNOSIS — Z452 Encounter for adjustment and management of vascular access device: Secondary | ICD-10-CM

## 2016-01-31 DIAGNOSIS — G894 Chronic pain syndrome: Secondary | ICD-10-CM | POA: Diagnosis present

## 2016-01-31 DIAGNOSIS — K5900 Constipation, unspecified: Secondary | ICD-10-CM | POA: Diagnosis present

## 2016-01-31 DIAGNOSIS — J9690 Respiratory failure, unspecified, unspecified whether with hypoxia or hypercapnia: Secondary | ICD-10-CM

## 2016-01-31 DIAGNOSIS — Z95810 Presence of automatic (implantable) cardiac defibrillator: Secondary | ICD-10-CM

## 2016-01-31 DIAGNOSIS — E039 Hypothyroidism, unspecified: Secondary | ICD-10-CM | POA: Diagnosis present

## 2016-01-31 DIAGNOSIS — T82594A Other mechanical complication of infusion catheter, initial encounter: Secondary | ICD-10-CM

## 2016-01-31 DIAGNOSIS — R29898 Other symptoms and signs involving the musculoskeletal system: Secondary | ICD-10-CM

## 2016-01-31 DIAGNOSIS — G9340 Encephalopathy, unspecified: Secondary | ICD-10-CM | POA: Diagnosis present

## 2016-01-31 DIAGNOSIS — E875 Hyperkalemia: Secondary | ICD-10-CM | POA: Diagnosis present

## 2016-01-31 DIAGNOSIS — M6282 Rhabdomyolysis: Secondary | ICD-10-CM | POA: Diagnosis present

## 2016-01-31 DIAGNOSIS — I447 Left bundle-branch block, unspecified: Secondary | ICD-10-CM | POA: Diagnosis present

## 2016-01-31 DIAGNOSIS — Z950 Presence of cardiac pacemaker: Secondary | ICD-10-CM

## 2016-01-31 DIAGNOSIS — G629 Polyneuropathy, unspecified: Secondary | ICD-10-CM | POA: Diagnosis present

## 2016-01-31 DIAGNOSIS — Z7901 Long term (current) use of anticoagulants: Secondary | ICD-10-CM

## 2016-01-31 DIAGNOSIS — I255 Ischemic cardiomyopathy: Secondary | ICD-10-CM | POA: Diagnosis present

## 2016-01-31 DIAGNOSIS — M25559 Pain in unspecified hip: Secondary | ICD-10-CM

## 2016-01-31 DIAGNOSIS — Y95 Nosocomial condition: Secondary | ICD-10-CM | POA: Diagnosis present

## 2016-01-31 DIAGNOSIS — J9601 Acute respiratory failure with hypoxia: Secondary | ICD-10-CM | POA: Diagnosis present

## 2016-01-31 DIAGNOSIS — I441 Atrioventricular block, second degree: Secondary | ICD-10-CM | POA: Diagnosis present

## 2016-01-31 DIAGNOSIS — N179 Acute kidney failure, unspecified: Secondary | ICD-10-CM

## 2016-01-31 DIAGNOSIS — M25552 Pain in left hip: Secondary | ICD-10-CM | POA: Diagnosis not present

## 2016-01-31 DIAGNOSIS — I351 Nonrheumatic aortic (valve) insufficiency: Secondary | ICD-10-CM | POA: Diagnosis present

## 2016-01-31 DIAGNOSIS — R5381 Other malaise: Secondary | ICD-10-CM | POA: Diagnosis not present

## 2016-01-31 DIAGNOSIS — J9600 Acute respiratory failure, unspecified whether with hypoxia or hypercapnia: Secondary | ICD-10-CM

## 2016-01-31 DIAGNOSIS — I639 Cerebral infarction, unspecified: Secondary | ICD-10-CM

## 2016-01-31 LAB — CBC WITH DIFFERENTIAL/PLATELET
BASOS ABS: 0 10*3/uL (ref 0.0–0.1)
Basophils Relative: 0 %
EOS ABS: 0.1 10*3/uL (ref 0.0–0.7)
Eosinophils Relative: 1 %
HCT: 39 % (ref 39.0–52.0)
Hemoglobin: 13.2 g/dL (ref 13.0–17.0)
LYMPHS ABS: 2.2 10*3/uL (ref 0.7–4.0)
Lymphocytes Relative: 28 %
MCH: 31.9 pg (ref 26.0–34.0)
MCHC: 33.8 g/dL (ref 30.0–36.0)
MCV: 94.2 fL (ref 78.0–100.0)
MONO ABS: 1 10*3/uL (ref 0.1–1.0)
Monocytes Relative: 13 %
NEUTROS ABS: 4.7 10*3/uL (ref 1.7–7.7)
Neutrophils Relative %: 58 %
PLATELETS: 267 10*3/uL (ref 150–400)
RBC: 4.14 MIL/uL — ABNORMAL LOW (ref 4.22–5.81)
RDW: 14.1 % (ref 11.5–15.5)
WBC: 8 10*3/uL (ref 4.0–10.5)

## 2016-01-31 LAB — I-STAT ARTERIAL BLOOD GAS, ED
ACID-BASE DEFICIT: 2 mmol/L (ref 0.0–2.0)
BICARBONATE: 23.7 meq/L (ref 20.0–24.0)
O2 SAT: 100 %
PH ART: 7.343 — AB (ref 7.350–7.450)
PO2 ART: 286 mmHg — AB (ref 80.0–100.0)
Patient temperature: 97.1
TCO2: 25 mmol/L (ref 0–100)
pCO2 arterial: 43.2 mmHg (ref 35.0–45.0)

## 2016-01-31 LAB — URINE MICROSCOPIC-ADD ON

## 2016-01-31 LAB — TROPONIN I: Troponin I: <0.03 ng/mL (ref <0.031)

## 2016-01-31 LAB — CBG MONITORING, ED: GLUCOSE-CAPILLARY: 143 mg/dL — AB (ref 65–99)

## 2016-01-31 LAB — COMPREHENSIVE METABOLIC PANEL
ALBUMIN: 3.6 g/dL (ref 3.5–5.0)
ALT: 84 U/L — AB (ref 17–63)
AST: 140 U/L — AB (ref 15–41)
Alkaline Phosphatase: 51 U/L (ref 38–126)
Anion gap: 11 (ref 5–15)
BILIRUBIN TOTAL: 1.2 mg/dL (ref 0.3–1.2)
BUN: 30 mg/dL — AB (ref 6–20)
CHLORIDE: 108 mmol/L (ref 101–111)
CO2: 18 mmol/L — ABNORMAL LOW (ref 22–32)
CREATININE: 2.17 mg/dL — AB (ref 0.61–1.24)
Calcium: 9.1 mg/dL (ref 8.9–10.3)
GFR calc Af Amer: 35 mL/min — ABNORMAL LOW (ref >60)
GFR, EST NON AFRICAN AMERICAN: 30 mL/min — AB (ref >60)
GLUCOSE: 157 mg/dL — AB (ref 65–99)
POTASSIUM: 4.9 mmol/L (ref 3.5–5.1)
SODIUM: 137 mmol/L (ref 135–145)
TOTAL PROTEIN: 7.4 g/dL (ref 6.5–8.1)

## 2016-01-31 LAB — BRAIN NATRIURETIC PEPTIDE: B Natriuretic Peptide: 406.8 pg/mL — ABNORMAL HIGH (ref 0.0–100.0)

## 2016-01-31 LAB — URINALYSIS, ROUTINE W REFLEX MICROSCOPIC
BILIRUBIN URINE: NEGATIVE
Glucose, UA: 100 mg/dL — AB
Ketones, ur: NEGATIVE mg/dL
Leukocytes, UA: NEGATIVE
NITRITE: NEGATIVE
Specific Gravity, Urine: 1.018 (ref 1.005–1.030)
pH: 6 (ref 5.0–8.0)

## 2016-01-31 LAB — PROTIME-INR
INR: 1.23 (ref 0.00–1.49)
Prothrombin Time: 15.7 seconds — ABNORMAL HIGH (ref 11.6–15.2)

## 2016-01-31 MED ORDER — AMIODARONE HCL 150 MG/3ML IV SOLN
150.0000 mg | INTRAVENOUS | Status: AC | PRN
  Administered 2016-01-31: 150 mg via INTRAVENOUS

## 2016-01-31 MED ORDER — PROPOFOL 1000 MG/100ML IV EMUL
5.0000 ug/kg/min | Freq: Once | INTRAVENOUS | Status: AC
  Administered 2016-01-31: 10 ug/kg/min via INTRAVENOUS

## 2016-01-31 MED ORDER — FENTANYL CITRATE (PF) 100 MCG/2ML IJ SOLN
INTRAMUSCULAR | Status: DC | PRN
  Administered 2016-01-31: 100 ug via INTRAVENOUS

## 2016-01-31 MED ORDER — ASPIRIN 300 MG RE SUPP
300.0000 mg | Freq: Once | RECTAL | Status: AC
  Administered 2016-02-01: 300 mg via RECTAL
  Filled 2016-01-31: qty 1

## 2016-01-31 MED ORDER — SODIUM BICARBONATE 8.4 % IV SOLN
INTRAVENOUS | Status: AC | PRN
  Administered 2016-01-31: 50 meq via INTRAVENOUS

## 2016-01-31 MED ORDER — PROPOFOL 1000 MG/100ML IV EMUL
INTRAVENOUS | Status: AC
  Administered 2016-01-31: 23:00:00
  Filled 2016-01-31: qty 100

## 2016-01-31 MED ORDER — ETOMIDATE 2 MG/ML IV SOLN
INTRAVENOUS | Status: AC | PRN
  Administered 2016-01-31: 20 mg via INTRAVENOUS

## 2016-01-31 MED ORDER — AMIODARONE HCL IN DEXTROSE 360-4.14 MG/200ML-% IV SOLN
60.0000 mg/h | Freq: Once | INTRAVENOUS | Status: AC
  Administered 2016-02-01: 33.33 mL/h via INTRAVENOUS
  Filled 2016-01-31: qty 200

## 2016-01-31 MED ORDER — MIDAZOLAM HCL 2 MG/2ML IJ SOLN
INTRAMUSCULAR | Status: AC
  Administered 2016-01-31: 23:00:00
  Filled 2016-01-31: qty 2

## 2016-01-31 MED ORDER — SUCCINYLCHOLINE CHLORIDE 20 MG/ML IJ SOLN
INTRAMUSCULAR | Status: AC | PRN
  Administered 2016-01-31: 100 mg via INTRAVENOUS

## 2016-01-31 MED ORDER — MIDAZOLAM HCL 5 MG/5ML IJ SOLN
INTRAMUSCULAR | Status: DC | PRN
  Administered 2016-01-31: 2 mg via INTRAVENOUS

## 2016-01-31 MED ORDER — PROPOFOL 1000 MG/100ML IV EMUL: 0.0000 ug/kg/min | INTRAVENOUS | Status: DC

## 2016-01-31 MED ORDER — SODIUM CHLORIDE 0.9 % IV SOLN
INTRAVENOUS | Status: AC | PRN
  Administered 2016-01-31 (×2): 1000 mL via INTRAVENOUS

## 2016-01-31 MED ORDER — FENTANYL CITRATE (PF) 100 MCG/2ML IJ SOLN
INTRAMUSCULAR | Status: AC
  Administered 2016-01-31: 23:00:00
  Filled 2016-01-31: qty 2

## 2016-01-31 NOTE — Code Documentation (Signed)
Iv cold saline started per EDP

## 2016-01-31 NOTE — Code Documentation (Addendum)
Pt to ED by [facility]. Pt was driving with family when he had a syncopal episode, lost pulses, and CPR was initiated by daughter for 5 mins before pulses return. Pt placed on AED, given shocks x 2 with ROSC. Pt  Had snoring respirations with EMS, breathing 8bpm on arrival and being bagged by EMS. Pupils pinpoint; 1mg  Narcan given without improvement

## 2016-01-31 NOTE — ED Notes (Signed)
CCM at bedside 

## 2016-01-31 NOTE — Procedures (Signed)
Arterial Catheter Insertion Procedure Note Naiem Everist [id number] [birth year]  Procedure: Insertion of Arterial Catheter  Indications: Blood pressure monitoring and Frequent blood sampling  Procedure Details Consent: Unable to obtain consent because of emergent medical necessity. Time Out: Verified patient identification, verified procedure, site/side was marked, verified correct patient position, special equipment/implants available, medications/allergies/relevent history reviewed, required imaging and test results available.  Performed  Maximum sterile technique was used including antiseptics, cap, gloves, gown, hand hygiene, mask and sheet. Skin prep: Chlorhexidine; local anesthetic administered 22 gauge catheter was inserted into right radial artery using the Seldinger technique.  Evaluation Blood flow good; BP tracing good. Complications: No apparent complications.  Per Hypothermia Protocol  Sharen Hint 01/31/2016

## 2016-01-31 NOTE — ED Notes (Signed)
Pt's CBG result was 143. Informed Dr. Kathrynn Humble and Marye Round - RN.

## 2016-01-31 NOTE — Code Documentation (Signed)
Ice packs applied to bilateral groin; hypothermia initiated per cards

## 2016-01-31 NOTE — ED Provider Notes (Addendum)
CSN: [id number]     Arrival date & time 01/31/16  2158 History   First MD Initiated Contact with Patient 01/31/16 2159     Chief Complaint  Patient presents with  . Cardiac Arrest     (Consider location/radiation/quality/duration/timing/severity/associated sxs/prior Treatment) HPI Comments: LEVEL 5 CAVEAT FOR POST CARDIAC ARREST  Pt comes in with cc of POST ARREST.  Pt has hx of systolic and diastolic CHF, afib on eliquis. Per wife patient and her were in a parking lot, when she noted that Mr. Fier started breathing heavily and became unresponsive. She started CPR right away and EMS was called. EMS arrived within 5-10 minutes, patient had a shockable rhythm when they arrived. Pt received 2 rounds of AED defibrillations, and patient arrived to the ER with ROSC. No meds en route.  Pt has a pulse. Bp is stable. Pt is breathing on his own, but 6-8 times and has shallow respirations. Pt has fixed pupils at 3 mm. CBG > 100.      The history is provided by medical records and the EMS personnel.    Past Medical History  Diagnosis Date  . Claustrophobia   . Heart murmur   . Hypertension   . Varicose vein of leg     right  . Dysrhythmia     "palpitations"  . Migraine 02/13/12    "opthalmic"  . Chronic lower back pain   . Exertional dyspnea 01/2012  . CHF (congestive heart failure) ([place])   . Hypothyroidism   . Chronic kidney disease     kidney fx studies increased    Past Surgical History  Procedure Laterality Date  . Skin melanocytoma excision  2012    "above left clavicle"  . Finger surgery  2012    "4th digit right hand; thumb on left hand"  . Tee without cardioversion  03/22/2012    Procedure: TRANSESOPHAGEAL ECHOCARDIOGRAM (TEE);  Surgeon: Candee Furbish, MD;  Location: [facility] ENDOSCOPY;  Service: Cardiovascular;  Laterality: N/A;  . Cardioversion  03/22/2012    Procedure: CARDIOVERSION;  Surgeon: Candee Furbish, MD;  Location: [place];  Service: Cardiovascular;  Laterality: N/A;   . Cardioversion  04/19/2012    Procedure: CARDIOVERSION;  Surgeon: Sinclair Grooms, MD;  Location: [place];  Service: Cardiovascular;  Laterality: N/A;  . Colonoscopy N/A 04/11/2013    Procedure: COLONOSCOPY;  Surgeon: Inda Castle, MD;  Location: [facility] ENDOSCOPY;  Service: Endoscopy;  Laterality: N/A;  . Colonoscopy N/A 04/11/2013    Procedure: COLONOSCOPY;  Surgeon: Inda Castle, MD;  Location: [facility] ENDOSCOPY;  Service: Endoscopy;  Laterality: N/A;   Family History  Problem Relation Age of Onset  . Hypertension Mother   . Heart disease Mother   . Hypertension Father   . Heart disease Father   . Heart failure Mother   . Diabetes Mother   . Heart failure Father   . Heart failure Mother    Social History  Substance Use Topics  . Smoking status: Never Smoker   . Smokeless tobacco: Never Used  . Alcohol Use: Yes     Comment: 02/13/12 "socially"    Review of Systems    Allergies  Lactose intolerance (gi)  Home Medications   Prior to Admission medications   Medication Sig Start Date End Date Taking? Authorizing Provider  atorvastatin (LIPITOR) 20 MG tablet TAKE 1 TABLET BY MOUTH DAILY 08/31/14   Belva Crome, MD  b complex vitamins tablet Take 1 tablet by mouth daily as needed (  cold/ energy).     Historical Provider, MD  carvedilol (COREG) 25 MG tablet TAKE 1 TABLET BY MOUTH TWICE DAILY 01/19/16   Belva Crome, MD  Cholecalciferol 2000 units CAPS Take 2,000 Units by mouth 3 (three) times a week.    Historical Provider, MD  Coenzyme Q10 (COQ10) 200 MG CAPS Take 200 mg by mouth daily.    Historical Provider, MD  ELIQUIS 5 MG TABS tablet TAKE 1 TABLET BY MOUTH TWICE DAILY 02/10/15   Belva Crome, MD  fluticasone [facility]) 50 MCG/ACT nasal spray Place 2 sprays into the nose as needed for allergies.     Historical Provider, MD  furosemide (LASIX) 40 MG tablet TAKE 1 TABLET BY MOUTH DAILY 11/23/15   Belva Crome, MD  isosorbide-hydrALAZINE (BIDIL) 20-37.5 MG per tablet Take 1 tablet  by mouth 2 (two) times daily.    Historical Provider, MD  levothyroxine (SYNTHROID, LEVOTHROID) 75 MCG tablet Take 1 tablet (75 mcg total) by mouth daily before breakfast. 08/15/13   Belva Crome, MD  magnesium oxide (MAG-OX) 400 MG tablet Take 400 mg by mouth daily.    Historical Provider, MD  spironolactone (ALDACTONE) 25 MG tablet TAKE 1 TABLET BY MOUTH EVERY DAY 01/19/16   Belva Crome, MD   BP 167/83 mmHg  Pulse 72  Temp(Src) 97.2 F (36.2 C)  Resp 24  Ht 5\' 9"  (1.753 m)  Wt 242 lb 1 oz (109.8 kg)  BMI 35.73 kg/m2  SpO2 100% Physical Exam  Constitutional: He appears well-developed.  HENT:  Head: Atraumatic.  Neck: Neck supple.  Distended neck veins  Cardiovascular: Normal rate and intact distal pulses.   Pulmonary/Chest: Effort normal.  Abdominal: Soft.  Musculoskeletal: He exhibits no edema.  Neurological:  Unresponsive, GCS -3. No purposeful movement  Skin: Skin is warm.  Nursing note and vitals reviewed.   ED Course  .Intubation Date/Time: 01/31/2016 11:34 PM Performed by: Maryan Puls Authorized by: Varney Biles Consent: The procedure was performed in an emergent situation. Patient identity confirmed: arm band Time out: Immediately prior to procedure a "time out" was called to verify the correct patient, procedure, equipment, support staff and site/side marked as required. Indications: respiratory failure and  airway protection Intubation method: direct Preoxygenation: BVM Sedatives: etomidate Paralytic: succinylcholine Laryngoscope size: Mac 3 Tube size: 7.5 mm Tube type: cuffed Number of attempts: 1 Ventilation between attempts: BVM Cricoid pressure: yes Cords visualized: yes Post-procedure assessment: chest rise Cuff inflated: yes ETT to lip: 22 cm Tube secured with: ETT holder Chest x-ray interpreted by me and radiologist. Chest x-ray findings: endotracheal tube in appropriate position Patient tolerance: Patient tolerated the procedure well  with no immediate complications  .Critical Care Performed by: Varney Biles Authorized by: Varney Biles Total critical care time: 55 minutes Critical care time was exclusive of separately billable procedures and treating other patients. Critical care was necessary to treat or prevent imminent or life-threatening deterioration of the following conditions: cardiac failure, circulatory failure and respiratory failure. Critical care was time spent personally by me on the following activities: blood draw for specimens, discussions with consultants, evaluation of patient's response to treatment, obtaining history from patient or surrogate, ordering and review of laboratory studies, pulse oximetry, review of old charts, discussions with primary provider, examination of patient, ordering and performing treatments and interventions, re-evaluation of patient's condition and ventilator management. Subsequent provider of critical care: I assumed direction of critical care for this patient from another provider of my specialty.   (including  critical care time) Labs Review Labs Reviewed  CBC WITH DIFFERENTIAL/PLATELET - Abnormal; Notable for the following:    RBC 4.14 (*)    All other components within normal limits  COMPREHENSIVE METABOLIC PANEL - Abnormal; Notable for the following:    CO2 18 (*)    Glucose, Bld 157 (*)    BUN 30 (*)    Creatinine, Ser 2.17 (*)    AST 140 (*)    ALT 84 (*)    GFR calc non Af Amer 30 (*)    GFR calc Af Amer 35 (*)    All other components within normal limits  BRAIN NATRIURETIC PEPTIDE - Abnormal; Notable for the following:    B Natriuretic Peptide 406.8 (*)    All other components within normal limits  PROTIME-INR - Abnormal; Notable for the following:    Prothrombin Time 15.7 (*)    All other components within normal limits  URINALYSIS, ROUTINE W REFLEX MICROSCOPIC (NOT AT [facility]) - Abnormal; Notable for the following:    APPearance TURBID (*)    Glucose,  UA 100 (*)    Hgb urine dipstick MODERATE (*)    Protein, ur >300 (*)    All other components within normal limits  URINE MICROSCOPIC-ADD ON - Abnormal; Notable for the following:    Squamous Epithelial / LPF 0-5 (*)    Bacteria, UA MANY (*)    Casts HYALINE CASTS (*)    All other components within normal limits  CBG MONITORING, ED - Abnormal; Notable for the following:    Glucose-Capillary 143 (*)    All other components within normal limits  I-STAT ARTERIAL BLOOD GAS, ED - Abnormal; Notable for the following:    pH, Arterial 7.343 (*)    pO2, Arterial 286.0 (*)    All other components within normal limits  URINE CULTURE  TROPONIN I  TRIGLYCERIDES  BLOOD GAS, ARTERIAL  I-STAT CG4 LACTIC ACID, ED    Imaging Review Dg Chest Port 1 View  01/31/2016  CLINICAL DATA:  Respiratory distress for 1 day. EXAM: PORTABLE CHEST 1 VIEW COMPARISON:  10/11/2012 FINDINGS: The endotracheal tube is 4.5 cm above the carina. No consolidation. No large effusions. No pneumothorax. IMPRESSION: Satisfactorily positioned ETT. Electronically Signed   By: Andreas Newport M.D.   On: 01/31/2016 22:25   I have personally reviewed and evaluated these images and lab results as part of my medical decision-making.   EKG Interpretation   Date/Time:  Monday Jan 31 2016 22:01:50 EDT Ventricular Rate:  97 PR Interval:  158 QRS Duration: 182 QT Interval:  440 QTC Calculation: 559 R Axis:   -19 Text Interpretation:  Sinus or ectopic atrial rhythm Left bundle branch  block discordant ST elevation anterior leads more pronounced T waves  peaked in anterior leads No significant change since last tracing  Confirmed by Kathrynn Humble, MD, Kanita Delage [phone]) on 01/31/2016 10:07:42 PM     EKG Interpretation  Date/Time:  Monday Jan 31 2016 22:12:03 EDT Ventricular Rate:  107 PR Interval:  108 QRS Duration: 173 QT Interval:  437 QTC Calculation: 583 R Axis:   -31 Text Interpretation:  Sinus tachycardia Left bundle branch  block SGARBOSSA NEGATIVE Nonspecific ST and T wave abnormality infeior lead has some changes compared to the last ekg, non specific Confirmed by Kathrynn Humble, MD, Lille Karim [phone]) on 02/01/2016 12:00:14 AM          MDM   Final diagnoses:  Cardiac arrest ([place])  Ventricular fibrillation ([place])    Pt  comes in post arrest. Known advanced CHF hx, but the last echo showed improved EF. Pt had a witnessed arrest and shockable rhythm, arrives to the ER post ROSC. Amio 150 mg followed by infusion started. Pt has LBBB here. SGARBOSSA neg. Still, consulted with interventional Cards, and Dr. Irish Lack agrees that no need for cath at the moment, serial trops is fine. Critical care consulted. Code hypothermia started. Goal temp of 36 degrees for now per Dr. Madalyn Rob. Wife and son updated. Pt is hemodynamically currently.    Varney Biles, MD 01/31/16 2356  Varney Biles, MD 02/01/16 0000

## 2016-01-31 NOTE — Consult Note (Signed)
CARDIOLOGY INPATIENT CONSULTATION NOTE  Patient ID: Eric [name] MRN: [id number], DOB/AGE: [birth year]   Admit date: 01/31/2016   Primary Physician: [name], MD Primary Cardiologist: Daneen Schick III MD  Reason for Consult:   Cardiac arrest  Requesting Physician: Varney Biles MD  HPI: This is a 65 y.o. male with prior history of non ischemic cardiomyopathy, LBBB, atrial fibrillation on eliquis, who presented with cardiac arrest.  Patient was talking to his wife in the car when he started having gurggling sounds and SOB and then collapsed. He was staring at that time. His wife called 23 and then started CPR. It took her about 1-2 minute to start CPR and then the EMS came within 5 minutes. Patient received AED shocks x 2 and achieved ROSC. Patient did not have any chest pain, syncope, palpitations, presyncope in the recent pass. There was no recent infection.  Patient did not have previous history of similar symptoms.   Problem List: Past Medical History  Diagnosis Date  . Claustrophobia   . Heart murmur   . Hypertension   . Varicose vein of leg     right  . Dysrhythmia     "palpitations"  . Migraine 02/13/12    "opthalmic"  . Chronic lower back pain   . Exertional dyspnea 01/2012  . CHF (congestive heart failure) ([place])   . Hypothyroidism   . Chronic kidney disease     kidney fx studies increased     Past Surgical History  Procedure Laterality Date  . Skin melanocytoma excision  2012    "above left clavicle"  . Finger surgery  2012    "4th digit right hand; thumb on left hand"  . Tee without cardioversion  03/22/2012    Procedure: TRANSESOPHAGEAL ECHOCARDIOGRAM (TEE);  Surgeon: Candee Furbish, MD;  Location: [facility] ENDOSCOPY;  Service: Cardiovascular;  Laterality: N/A;  . Cardioversion  03/22/2012    Procedure: CARDIOVERSION;  Surgeon: Candee Furbish, MD;  Location: [place];  Service: Cardiovascular;  Laterality: N/A;  . Cardioversion  04/19/2012    Procedure:  CARDIOVERSION;  Surgeon: Sinclair Grooms, MD;  Location: [place];  Service: Cardiovascular;  Laterality: N/A;  . Colonoscopy N/A 04/11/2013    Procedure: COLONOSCOPY;  Surgeon: Inda Castle, MD;  Location: [facility] ENDOSCOPY;  Service: Endoscopy;  Laterality: N/A;  . Colonoscopy N/A 04/11/2013    Procedure: COLONOSCOPY;  Surgeon: Inda Castle, MD;  Location: [facility] ENDOSCOPY;  Service: Endoscopy;  Laterality: N/A;     Allergies:  Allergies  Allergen Reactions  . Lactose Intolerance (Gi) Diarrhea     Home Medications Current Facility-Administered Medications  Medication Dose Route Frequency Provider Last Rate Last Dose  . amiodarone (NEXTERONE PREMIX) 360-4.14 MG/200ML-% (1.8 mg/mL) IV infusion  60 mg/hr Intravenous Once Ankit Nanavati, MD      . artificial tears (LACRILUBE) ophthalmic ointment 1 application  1 application Both Eyes [id number] Rahul P Desai, PA-C      . aspirin suppository 300 mg  300 mg Rectal Once Ankit Nanavati, MD      . atorvastatin (LIPITOR) tablet 20 mg  20 mg Per Tube q1800 Rahul P Desai, PA-C      . cisatracurium (NIMBEX) 200 mg in sodium chloride 0.9 % 200 mL (1 mg/mL) infusion  1-1.5 mcg/kg/min Intravenous Continuous Rahul P Desai, PA-C 9.9 mL/hr at 02/01/16 0107 1.5 mcg/kg/min at 02/01/16 0107   And  . cisatracurium (NIMBEX) bolus via infusion 5.5 mg  0.05 mg/kg Intravenous PRN Rahul P Desai, PA-C      .  dextrose 50 % solution 50 mL  1 ampule Intravenous Once [employer], MD      . fentaNYL (SUBLIMAZE) 100 MCG/2ML injection           . fentaNYL (SUBLIMAZE) 2,500 mcg in sodium chloride 0.9 % 250 mL (10 mcg/mL) infusion  25-400 mcg/hr Intravenous Continuous Rahul P Desai, PA-C      . fentaNYL (SUBLIMAZE) bolus via infusion 25 mcg  25 mcg Intravenous Q30 min PRN Rahul P Desai, PA-C      . fentaNYL (SUBLIMAZE) injection 50 mcg  50 mcg Intravenous Once Rahul P Desai, PA-C      . furosemide (LASIX) injection 40 mg  40 mg Intravenous Once [employer], MD        . heparin ADULT infusion 100 units/mL (25000 units/250 mL)  800 Units/hr Intravenous Continuous Laren Everts, RPH      . insulin aspart (novoLOG) injection 10 Units  10 Units Subcutaneous Once [employer], MD      . insulin aspart (novoLOG) injection 2-6 Units  2-6 Units Subcutaneous Q4H Rahul P Desai, PA-C      . levothyroxine (SYNTHROID, LEVOTHROID) injection 37.5 mcg  37.5 mcg Intravenous Daily Rahul P Desai, PA-C      . midazolam (VERSED) 2 MG/2ML injection           . norepinephrine (LEVOPHED) 4 mg in dextrose 5 % 250 mL (0.016 mg/mL) infusion  0-50 mcg/min Intravenous Titrated Rahul P Desai, PA-C      . pantoprazole (PROTONIX) injection 40 mg  40 mg Intravenous QHS Rahul P Desai, PA-C      . propofol (DIPRIVAN) 1000 MG/100ML infusion           . propofol (DIPRIVAN) 1000 MG/100ML infusion  5-80 mcg/kg/min Intravenous Continuous Rahul P Desai, PA-C 13.2 mL/hr at 02/01/16 0109 20 mcg/kg/min at 02/01/16 0109  . sodium polystyrene (KAYEXALATE) 15 GM/60ML suspension 30 g  30 g Oral Once [facility], MD         Family History  Problem Relation Age of Onset  . Hypertension Mother   . Heart disease Mother   . Hypertension Father   . Heart disease Father   . Heart failure Mother   . Diabetes Mother   . Heart failure Father   . Heart failure Mother      Social History   Social History  . Marital Status: Married    Spouse Name: [name]  . Number of Children: 1  . Years of Education: N/A   Occupational History  .     Social History Main Topics  . Smoking status: Never Smoker   . Smokeless tobacco: Never Used  . Alcohol Use: Yes     Comment: 02/13/12 "socially"  . Drug Use: No  . Sexual Activity: Not Currently   Other Topics Concern  . 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     Review of Systems: General: no report of fevers or chills, recent viral infections, weight loss   Cardiovascular: no report of CHF symptoms recently  Dermatological: negative for rash Respiratory: no report of cough or recent respiratory infections but patient developed SOB prior to passing out Urologic: negative for hematuria Abdominal: no report of nausea, vomiting, aspiration Neurologic: report of staring episode, but no report of urinary or bowel incontinence, diarrhea Hematology:  on blood thinner, easy bruising Psychiatry: no report of suicidal/homicidal  Musculoskeletal: joint pains   Physical Exam: Vitals: BP 166/92 mmHg  Pulse 66  Temp(Src) 98.1 F (36.7 C) (Core (Comment))  Resp 18  Ht 5\' 9"  (1.753 m)  Wt 112.2 kg (247 lb 5.7 oz)  BMI 36.51 kg/m2  SpO2 100% General: not in acute distress Neck: JVP cannot be assessed as laying flat due to ventillator, neck supple Heart: diastolic murmur grade I/VI at aortic area, regular rate and rhythm, S1, S2, no murmurs  Lungs: CTAB  GI: non tender, non distended, bowel sounds present Extremities: no edema Neuro: sedated on propofol and intubated Psych: normal affect, no anxiety  Labs:   Results for orders placed or performed during the hospital encounter of 01/31/16 (from the past 24 hour(s))  CBC with Differential/Platelet     Status: Abnormal   Collection Time: 01/31/16 10:06 PM  Result Value Ref Range   WBC 8.0 4.0 - 10.5 K/uL   RBC 4.14 (L) 4.22 - 5.81 MIL/uL   Hemoglobin 13.2 13.0 - 17.0 g/dL   HCT 39.0 39.0 - 52.0 %   MCV 94.2 78.0 - 100.0 fL   MCH 31.9 26.0 - 34.0 pg   MCHC 33.8 30.0 - 36.0 g/dL   RDW 14.1 11.5 - 15.5 %   Platelets 267 150 - 400 K/uL   Neutrophils Relative % 58 %   Lymphocytes Relative 28 %   Monocytes Relative 13 %   Eosinophils Relative 1 %   Basophils Relative 0 %   Neutro Abs 4.7 1.7 - 7.7 K/uL   Lymphs Abs 2.2 0.7 - 4.0 K/uL   Monocytes Absolute 1.0 0.1 - 1.0 K/uL   Eosinophils Absolute 0.1 0.0 - 0.7 K/uL   Basophils Absolute 0.0 0.0 - 0.1 K/uL   Smear Review MORPHOLOGY UNREMARKABLE    Comprehensive metabolic panel     Status: Abnormal   Collection Time: 01/31/16 10:06 PM  Result Value Ref Range   Sodium 137 135 - 145 mmol/L   Potassium 4.9 3.5 - 5.1 mmol/L   Chloride 108 101 - 111 mmol/L   CO2 18 (L) 22 - 32 mmol/L   Glucose, Bld 157 (H) 65 - 99 mg/dL   BUN 30 (H) 6 - 20 mg/dL   Creatinine, Ser 2.17 (H) 0.61 - 1.24 mg/dL   Calcium 9.1 8.9 - 10.3 mg/dL   Total Protein 7.4 6.5 - 8.1 g/dL   Albumin 3.6 3.5 - 5.0 g/dL   AST 140 (H) 15 - 41 U/L   ALT 84 (H) 17 - 63 U/L   Alkaline Phosphatase 51 38 - 126 U/L   Total Bilirubin 1.2 0.3 - 1.2 mg/dL   GFR calc non Af Amer 30 (L) >60 mL/min   GFR calc Af Amer 35 (L) >60 mL/min   Anion gap 11 5 - 15  Troponin I     Status: None   Collection Time: 01/31/16 10:06 PM  Result Value Ref Range   Troponin I <0.03 <0.031 ng/mL  Brain natriuretic peptide     Status: Abnormal   Collection Time: 01/31/16 10:06 PM  Result Value Ref Range   B Natriuretic Peptide 406.8 (H) 0.0 - 100.0 pg/mL  Protime-INR     Status: Abnormal   Collection Time: 01/31/16 10:06 PM  Result Value Ref Range   Prothrombin Time 15.7 (H) 11.6 - 15.2 seconds   INR 1.23 0.00 - 1.49  CBG monitoring, ED     Status: Abnormal  Collection Time: 01/31/16 10:11 PM  Result Value Ref Range   Glucose-Capillary 143 (H) 65 - 99 mg/dL   Comment 1 Notify RN    Comment 2 Document in Chart   Urinalysis, Routine w reflex microscopic (not at [facility])     Status: Abnormal   Collection Time: 01/31/16 10:36 PM  Result Value Ref Range   Color, Urine YELLOW YELLOW   APPearance TURBID (A) CLEAR   Specific Gravity, Urine 1.018 1.005 - 1.030   pH 6.0 5.0 - 8.0   Glucose, UA 100 (A) NEGATIVE mg/dL   Hgb urine dipstick MODERATE (A) NEGATIVE   Bilirubin Urine NEGATIVE NEGATIVE   Ketones, ur NEGATIVE NEGATIVE mg/dL   Protein, ur >300 (A) NEGATIVE mg/dL   Nitrite NEGATIVE NEGATIVE   Leukocytes, UA NEGATIVE NEGATIVE  Urine microscopic-add on     Status: Abnormal   Collection  Time: 01/31/16 10:36 PM  Result Value Ref Range   Squamous Epithelial / LPF 0-5 (A) NONE SEEN   WBC, UA 6-30 0 - 5 WBC/hpf   RBC / HPF 6-30 0 - 5 RBC/hpf   Bacteria, UA MANY (A) NONE SEEN   Casts HYALINE CASTS (A) NEGATIVE   Urine-Other AMORPHOUS URATES/PHOSPHATES   I-Stat arterial blood gas, ED     Status: Abnormal   Collection Time: 01/31/16 11:08 PM  Result Value Ref Range   pH, Arterial 7.343 (L) 7.350 - 7.450   pCO2 arterial 43.2 35.0 - 45.0 mmHg   pO2, Arterial 286.0 (H) 80.0 - 100.0 mmHg   Bicarbonate 23.7 20.0 - 24.0 mEq/L   TCO2 25 0 - 100 mmol/L   O2 Saturation 100.0 %   Acid-base deficit 2.0 0.0 - 2.0 mmol/L   Patient temperature 97.1 F    Collection site ARTERIAL LINE    Drawn by Operator    Sample type ARTERIAL   Protime-INR now and repeat in 8 hours     Status: Abnormal   Collection Time: 02/01/16  1:05 AM  Result Value Ref Range   Prothrombin Time 16.2 (H) 11.6 - 15.2 seconds   INR 1.29 0.00 - 1.49  APTT now and repeat in 8 hours     Status: None   Collection Time: 02/01/16  1:05 AM  Result Value Ref Range   aPTT 25 24 - 37 seconds  I-STAT, chem 8     Status: Abnormal   Collection Time: 02/01/16  1:16 AM  Result Value Ref Range   Sodium 141 135 - 145 mmol/L   Potassium 6.3 (HH) 3.5 - 5.1 mmol/L   Chloride 110 101 - 111 mmol/L   BUN 46 (H) 6 - 20 mg/dL   Creatinine, Ser 1.90 (H) 0.61 - 1.24 mg/dL   Glucose, Bld 163 (H) 65 - 99 mg/dL   Calcium, Ion 1.15 1.13 - 1.30 mmol/L   TCO2 24 0 - 100 mmol/L   Hemoglobin 13.3 13.0 - 17.0 g/dL   HCT 39.0 39.0 - 52.0 %   Comment NOTIFIED PHYSICIAN      Radiology/Studies: Ct Head Wo Contrast  02/01/2016  CLINICAL DATA:  Cardiac arrest EXAM: CT HEAD WITHOUT CONTRAST TECHNIQUE: Contiguous axial images were obtained from the base of the skull through the vertex without intravenous contrast. COMPARISON:  None. FINDINGS: There is no intracranial hemorrhage, mass or evidence of acute infarction. There is no extra-axial fluid  collection. Gray matter and white matter appear normal. Cerebral volume is normal for age. Brainstem and posterior fossa are unremarkable. The CSF spaces appear normal.  The bony structures are intact. The visible portions of the paranasal sinuses are clear. The orbits are unremarkable. IMPRESSION: Normal brain Electronically Signed   By: Andreas Newport M.D.   On: 02/01/2016 00:49   Dg Chest Port 1 View  01/31/2016  CLINICAL DATA:  Respiratory distress for 1 day. EXAM: PORTABLE CHEST 1 VIEW COMPARISON:  10/11/2012 FINDINGS: The endotracheal tube is 4.5 cm above the carina. No consolidation. No large effusions. No pneumothorax. IMPRESSION: Satisfactorily positioned ETT. Electronically Signed   By: Andreas Newport M.D.   On: 01/31/2016 22:25    EKG: 01/31/2016  sinus tachycardia, LBBB with repolarization changes, QRS is 173 ms  Echo: 01/19/2016  - Left ventricle: The cavity size was moderately dilated. Wall  thickness was normal. Systolic function was mildly to moderately  reduced. The estimated ejection fraction was in the range of 40%  to 45%. Diffuse hypokinesis. - Ventricular septum: Septal motion showed abnormal function,  dyssynergy, and paradox. These changes are consistent with  intraventricular conduction delay. - mild to moderate aortic regurgitation - severely dilated LA and RA with mildly dilated RV  Cardiac cath: patient did not have a prior cath due to renal impairement  Medical decision making:  Discussed care with the patient's son over the phone and wife  Discussed care with the ED physician on the phone Reviewed labs and imaging personally Reviewed prior records  ASSESSMENT AND PLAN:  This is a 65 y.o. male with known history of non ischemic cardiomyopathy, atrial fibrillation on A/C therpay, CKD stage IV and intermittent chest pain who presented with an episode of cardiac arrest presumed to be arrhythmic in nature.     Active Problems:   Chronic systolic heart  failure (HCC)   Hyperlipidemia   Left bundle branch block   Obesity (BMI 30-39.9)   Atrial flutter ([place])   Cardiac arrest ([place])   Acute respiratory failure ([place])   Encounter for central line placement   Cardiac arrest s/p 2 AED shocks  Check lactate, cycle troponin, echocardiogram in the AM - support breathing with mechanical ventilation, since there is possibility of arrhythmia related event, will consider continuing patient on amiodarone bolus overnight.  - patient is at high risk of developing contrast induced nephropathy so shared decision making regarding cardiac catheterization vs. Stress testing should be made to further evaluate the need for ischemic evaluation - echocardiogram in the AM  Chronic LBBB, QRS 173 ms Unchanged EKG from previous  Hypertension, essential  Continue to keep SBP <140  Atrial fibrillation, paroxysmal  Hold eliquis, start on IV heparin in anticipation of procedure  Moderate aortic regurgitation  Ordered echocardiogram to evaluate for LVEF/AI   Signed, Flossie Dibble, MD MS 02/01/2016, 2:23 AM

## 2016-01-31 NOTE — Progress Notes (Signed)
RT NOTE:  Pt transported to CT without event.  

## 2016-02-01 ENCOUNTER — Inpatient Hospital Stay (HOSPITAL_COMMUNITY): Payer: BC Managed Care – PPO

## 2016-02-01 ENCOUNTER — Other Ambulatory Visit: Payer: Self-pay

## 2016-02-01 DIAGNOSIS — J9600 Acute respiratory failure, unspecified whether with hypoxia or hypercapnia: Secondary | ICD-10-CM | POA: Diagnosis present

## 2016-02-01 DIAGNOSIS — E039 Hypothyroidism, unspecified: Secondary | ICD-10-CM | POA: Insufficient documentation

## 2016-02-01 DIAGNOSIS — I5023 Acute on chronic systolic (congestive) heart failure: Secondary | ICD-10-CM | POA: Insufficient documentation

## 2016-02-01 DIAGNOSIS — R4182 Altered mental status, unspecified: Secondary | ICD-10-CM | POA: Diagnosis not present

## 2016-02-01 DIAGNOSIS — I469 Cardiac arrest, cause unspecified: Secondary | ICD-10-CM | POA: Diagnosis present

## 2016-02-01 DIAGNOSIS — G9340 Encephalopathy, unspecified: Secondary | ICD-10-CM | POA: Diagnosis not present

## 2016-02-01 DIAGNOSIS — I4901 Ventricular fibrillation: Secondary | ICD-10-CM | POA: Diagnosis not present

## 2016-02-01 DIAGNOSIS — I5042 Chronic combined systolic (congestive) and diastolic (congestive) heart failure: Secondary | ICD-10-CM | POA: Insufficient documentation

## 2016-02-01 DIAGNOSIS — J9601 Acute respiratory failure with hypoxia: Secondary | ICD-10-CM | POA: Diagnosis not present

## 2016-02-01 DIAGNOSIS — I5022 Chronic systolic (congestive) heart failure: Secondary | ICD-10-CM | POA: Diagnosis not present

## 2016-02-01 DIAGNOSIS — N189 Chronic kidney disease, unspecified: Secondary | ICD-10-CM | POA: Insufficient documentation

## 2016-02-01 DIAGNOSIS — I472 Ventricular tachycardia: Secondary | ICD-10-CM | POA: Diagnosis not present

## 2016-02-01 DIAGNOSIS — I499 Cardiac arrhythmia, unspecified: Secondary | ICD-10-CM | POA: Diagnosis present

## 2016-02-01 DIAGNOSIS — Z452 Encounter for adjustment and management of vascular access device: Secondary | ICD-10-CM | POA: Diagnosis present

## 2016-02-01 HISTORY — DX: Cardiac arrest, cause unspecified: I46.9

## 2016-02-01 LAB — GLUCOSE, CAPILLARY
GLUCOSE-CAPILLARY: 153 mg/dL — AB (ref 65–99)
GLUCOSE-CAPILLARY: 172 mg/dL — AB (ref 65–99)
GLUCOSE-CAPILLARY: 174 mg/dL — AB (ref 65–99)
GLUCOSE-CAPILLARY: 156 mg/dL — AB (ref 65–99)
GLUCOSE-CAPILLARY: 161 mg/dL — AB (ref 65–99)
GLUCOSE-CAPILLARY: 163 mg/dL — AB (ref 65–99)
GLUCOSE-CAPILLARY: 175 mg/dL — AB (ref 65–99)
GLUCOSE-CAPILLARY: 126 mg/dL — AB (ref 65–99)
Glucose-Capillary: 171 mg/dL — ABNORMAL HIGH (ref 65–99)
Glucose-Capillary: 171 mg/dL — ABNORMAL HIGH (ref 65–99)
Glucose-Capillary: 165 mg/dL — ABNORMAL HIGH (ref 65–99)
Glucose-Capillary: 159 mg/dL — ABNORMAL HIGH (ref 65–99)
Glucose-Capillary: 176 mg/dL — ABNORMAL HIGH (ref 65–99)
Glucose-Capillary: 172 mg/dL — ABNORMAL HIGH (ref 65–99)

## 2016-02-01 LAB — BASIC METABOLIC PANEL
ANION GAP: 12 (ref 5–15)
ANION GAP: 15 (ref 5–15)
ANION GAP: 15 (ref 5–15)
Anion gap: 12 (ref 5–15)
Anion gap: 14 (ref 5–15)
Anion gap: 10 (ref 5–15)
BUN: 29 mg/dL — ABNORMAL HIGH (ref 6–20)
BUN: 29 mg/dL — AB (ref 6–20)
BUN: 28 mg/dL — AB (ref 6–20)
BUN: 28 mg/dL — ABNORMAL HIGH (ref 6–20)
BUN: 26 mg/dL — AB (ref 6–20)
BUN: 23 mg/dL — AB (ref 6–20)
CALCIUM: 8.4 mg/dL — AB (ref 8.9–10.3)
CALCIUM: 8.6 mg/dL — AB (ref 8.9–10.3)
CALCIUM: 8.6 mg/dL — AB (ref 8.9–10.3)
CALCIUM: 8.5 mg/dL — AB (ref 8.9–10.3)
CHLORIDE: 108 mmol/L (ref 101–111)
CHLORIDE: 111 mmol/L (ref 101–111)
CHLORIDE: 115 mmol/L — AB (ref 101–111)
CO2: 16 mmol/L — ABNORMAL LOW (ref 22–32)
CO2: 17 mmol/L — ABNORMAL LOW (ref 22–32)
CO2: 16 mmol/L — ABNORMAL LOW (ref 22–32)
CO2: 16 mmol/L — AB (ref 22–32)
CO2: 16 mmol/L — AB (ref 22–32)
CO2: 17 mmol/L — AB (ref 22–32)
CREATININE: 1.65 mg/dL — AB (ref 0.61–1.24)
CREATININE: 1.59 mg/dL — AB (ref 0.61–1.24)
Calcium: 8.6 mg/dL — ABNORMAL LOW (ref 8.9–10.3)
Calcium: 8.8 mg/dL — ABNORMAL LOW (ref 8.9–10.3)
Chloride: 109 mmol/L (ref 101–111)
Chloride: 109 mmol/L (ref 101–111)
Chloride: 108 mmol/L (ref 101–111)
Creatinine, Ser: 1.79 mg/dL — ABNORMAL HIGH (ref 0.61–1.24)
Creatinine, Ser: 1.8 mg/dL — ABNORMAL HIGH (ref 0.61–1.24)
Creatinine, Ser: 1.64 mg/dL — ABNORMAL HIGH (ref 0.61–1.24)
Creatinine, Ser: 1.71 mg/dL — ABNORMAL HIGH (ref 0.61–1.24)
GFR calc Af Amer: 44 mL/min — ABNORMAL LOW (ref >60)
GFR calc Af Amer: 49 mL/min — ABNORMAL LOW (ref >60)
GFR calc Af Amer: 49 mL/min — ABNORMAL LOW (ref >60)
GFR calc Af Amer: 51 mL/min — ABNORMAL LOW (ref >60)
GFR calc non Af Amer: 41 mL/min — ABNORMAL LOW (ref >60)
GFR calc non Af Amer: 42 mL/min — ABNORMAL LOW (ref >60)
GFR calc non Af Amer: 44 mL/min — ABNORMAL LOW (ref >60)
GFR, EST AFRICAN AMERICAN: 44 mL/min — AB (ref >60)
GFR, EST AFRICAN AMERICAN: 47 mL/min — AB (ref >60)
GFR, EST NON AFRICAN AMERICAN: 38 mL/min — AB (ref >60)
GFR, EST NON AFRICAN AMERICAN: 38 mL/min — AB (ref >60)
GFR, EST NON AFRICAN AMERICAN: 43 mL/min — AB (ref >60)
GLUCOSE: 171 mg/dL — AB (ref 65–99)
GLUCOSE: 171 mg/dL — AB (ref 65–99)
GLUCOSE: 167 mg/dL — AB (ref 65–99)
Glucose, Bld: 176 mg/dL — ABNORMAL HIGH (ref 65–99)
Glucose, Bld: 173 mg/dL — ABNORMAL HIGH (ref 65–99)
Glucose, Bld: 175 mg/dL — ABNORMAL HIGH (ref 65–99)
POTASSIUM: 3 mmol/L — AB (ref 3.5–5.1)
POTASSIUM: 3.6 mmol/L (ref 3.5–5.1)
Potassium: 4.2 mmol/L (ref 3.5–5.1)
Potassium: 3.8 mmol/L (ref 3.5–5.1)
Potassium: 3.6 mmol/L (ref 3.5–5.1)
Potassium: 3.6 mmol/L (ref 3.5–5.1)
SODIUM: 138 mmol/L (ref 135–145)
SODIUM: 139 mmol/L (ref 135–145)
SODIUM: 139 mmol/L (ref 135–145)
Sodium: 137 mmol/L (ref 135–145)
Sodium: 141 mmol/L (ref 135–145)
Sodium: 142 mmol/L (ref 135–145)

## 2016-02-01 LAB — TRIGLYCERIDES: TRIGLYCERIDES: 82 mg/dL (ref <150)

## 2016-02-01 LAB — POCT I-STAT, CHEM 8
BUN: 46 mg/dL — ABNORMAL HIGH (ref 6–20)
BUN: 22 mg/dL — AB (ref 6–20)
CREATININE: 1.5 mg/dL — AB (ref 0.61–1.24)
Calcium, Ion: 1.15 mmol/L (ref 1.13–1.30)
Calcium, Ion: 1.18 mmol/L (ref 1.13–1.30)
Chloride: 110 mmol/L (ref 101–111)
Chloride: 112 mmol/L — ABNORMAL HIGH (ref 101–111)
Creatinine, Ser: 1.9 mg/dL — ABNORMAL HIGH (ref 0.61–1.24)
GLUCOSE: 123 mg/dL — AB (ref 65–99)
Glucose, Bld: 163 mg/dL — ABNORMAL HIGH (ref 65–99)
HCT: 43 % (ref 39.0–52.0)
HEMATOCRIT: 39 % (ref 39.0–52.0)
HEMOGLOBIN: 13.3 g/dL (ref 13.0–17.0)
HEMOGLOBIN: 14.6 g/dL (ref 13.0–17.0)
POTASSIUM: 3.8 mmol/L (ref 3.5–5.1)
Potassium: 6.3 mmol/L (ref 3.5–5.1)
Sodium: 141 mmol/L (ref 135–145)
Sodium: 146 mmol/L — ABNORMAL HIGH (ref 135–145)
TCO2: 24 mmol/L (ref 0–100)
TCO2: 18 mmol/L (ref 0–100)

## 2016-02-01 LAB — CBC
HEMATOCRIT: 34.1 % — AB (ref 39.0–52.0)
HEMOGLOBIN: 11.6 g/dL — AB (ref 13.0–17.0)
MCH: 31.4 pg (ref 26.0–34.0)
MCHC: 34 g/dL (ref 30.0–36.0)
MCV: 92.2 fL (ref 78.0–100.0)
Platelets: 213 10*3/uL (ref 150–400)
RBC: 3.7 MIL/uL — AB (ref 4.22–5.81)
RDW: 14 % (ref 11.5–15.5)
WBC: 7.9 10*3/uL (ref 4.0–10.5)

## 2016-02-01 LAB — PHOSPHORUS: PHOSPHORUS: 1.6 mg/dL — AB (ref 2.5–4.6)

## 2016-02-01 LAB — TSH: TSH: 2.89 u[IU]/mL (ref 0.350–4.500)

## 2016-02-01 LAB — PROTIME-INR
INR: 1.29 (ref 0.00–1.49)
INR: 1.51 — ABNORMAL HIGH (ref 0.00–1.49)
INR: 1.5 — ABNORMAL HIGH (ref 0.00–1.49)
Prothrombin Time: 16.2 seconds — ABNORMAL HIGH (ref 11.6–15.2)
Prothrombin Time: 18.2 seconds — ABNORMAL HIGH (ref 11.6–15.2)
Prothrombin Time: 18.2 seconds — ABNORMAL HIGH (ref 11.6–15.2)

## 2016-02-01 LAB — APTT
APTT: 65 s — AB (ref 24–37)
aPTT: 25 seconds (ref 24–37)
aPTT: 42 seconds — ABNORMAL HIGH (ref 24–37)
aPTT: 85 seconds — ABNORMAL HIGH (ref 24–37)

## 2016-02-01 LAB — TROPONIN I
TROPONIN I: 0.11 ng/mL — AB (ref <0.031)
TROPONIN I: 0.08 ng/mL — AB (ref <0.031)
Troponin I: 0.09 ng/mL — ABNORMAL HIGH (ref <0.031)
Troponin I: 0.11 ng/mL — ABNORMAL HIGH (ref <0.031)
Troponin I: 0.06 ng/mL — ABNORMAL HIGH (ref <0.031)

## 2016-02-01 LAB — ECHOCARDIOGRAM COMPLETE
Height: 69 in
Weight: 3957.7 oz

## 2016-02-01 LAB — HEPARIN LEVEL (UNFRACTIONATED)
Heparin Unfractionated: 0.97 IU/mL — ABNORMAL HIGH (ref 0.30–0.70)
Heparin Unfractionated: 1.09 IU/mL — ABNORMAL HIGH (ref 0.30–0.70)

## 2016-02-01 LAB — LACTIC ACID, PLASMA
LACTIC ACID, VENOUS: 1.4 mmol/L (ref 0.5–2.0)
Lactic Acid, Venous: 1.2 mmol/L (ref 0.5–2.0)

## 2016-02-01 LAB — MAGNESIUM: Magnesium: 1.8 mg/dL (ref 1.7–2.4)

## 2016-02-01 LAB — MRSA PCR SCREENING: MRSA BY PCR: NEGATIVE

## 2016-02-01 MED ORDER — CISATRACURIUM BESYLATE (PF) 200 MG/20ML IV SOLN
1.0000 ug/kg/min | INTRAVENOUS | Status: DC
  Administered 2016-02-01: 1.5 ug/kg/min via INTRAVENOUS
  Filled 2016-02-01: qty 20

## 2016-02-01 MED ORDER — SODIUM POLYSTYRENE SULFONATE 15 GM/60ML PO SUSP
30.0000 g | Freq: Once | ORAL | Status: DC
  Filled 2016-02-01: qty 120

## 2016-02-01 MED ORDER — CISATRACURIUM BOLUS VIA INFUSION
1.5000 mg/kg | Freq: Once | INTRAVENOUS | Status: AC
  Filled 2016-02-01: qty 169

## 2016-02-01 MED ORDER — AMIODARONE HCL IN DEXTROSE 360-4.14 MG/200ML-% IV SOLN
60.0000 mg/h | INTRAVENOUS | Status: DC
  Administered 2016-02-01: 60 mg/h via INTRAVENOUS

## 2016-02-01 MED ORDER — PROPOFOL 1000 MG/100ML IV EMUL
5.0000 ug/kg/min | INTRAVENOUS | Status: DC
  Administered 2016-02-01: 50 ug/kg/min via INTRAVENOUS
  Filled 2016-02-01: qty 100

## 2016-02-01 MED ORDER — ATORVASTATIN CALCIUM 20 MG PO TABS
20.0000 mg | ORAL_TABLET | Freq: Every day | ORAL | Status: DC
  Administered 2016-02-01 – 2016-02-05 (×5): 20 mg
  Filled 2016-02-01 (×5): qty 1

## 2016-02-01 MED ORDER — SODIUM CHLORIDE 0.9 % IV SOLN
25.0000 ug/h | INTRAVENOUS | Status: DC
  Administered 2016-02-01: 350 ug/h via INTRAVENOUS
  Filled 2016-02-01 (×3): qty 50

## 2016-02-01 MED ORDER — SODIUM PHOSPHATES 45 MMOLE/15ML IV SOLN
30.0000 mmol | Freq: Once | INTRAVENOUS | Status: AC
  Administered 2016-02-01: 30 mmol via INTRAVENOUS
  Filled 2016-02-01: qty 10

## 2016-02-01 MED ORDER — ANTISEPTIC ORAL RINSE SOLUTION (CORINZ): 7.0000 mL | Freq: Four times a day (QID) | OROMUCOSAL | Status: DC

## 2016-02-01 MED ORDER — CHLORHEXIDINE GLUCONATE 0.12% ORAL RINSE (MEDLINE KIT)
15.0000 mL | Freq: Two times a day (BID) | OROMUCOSAL | Status: DC
  Administered 2016-02-01 – 2016-02-06 (×11): 15 mL via OROMUCOSAL

## 2016-02-01 MED ORDER — CISATRACURIUM BOLUS VIA INFUSION
0.1000 mg/kg | Freq: Once | INTRAVENOUS | Status: AC
  Administered 2016-02-01: 11 mg via INTRAVENOUS
  Filled 2016-02-01: qty 11

## 2016-02-01 MED ORDER — SODIUM CHLORIDE 0.9 % IV SOLN
1.0000 ug/kg/min | INTRAVENOUS | Status: DC
  Filled 2016-02-01 (×2): qty 20

## 2016-02-01 MED ORDER — FENTANYL BOLUS VIA INFUSION: 50.0000 ug | INTRAVENOUS | Status: DC | PRN

## 2016-02-01 MED ORDER — SODIUM CHLORIDE 0.9 % IV SOLN
2.0000 mg/h | INTRAVENOUS | Status: DC
  Administered 2016-02-01: 2 mg/h via INTRAVENOUS
  Administered 2016-02-02: 4 mg/h via INTRAVENOUS
  Administered 2016-02-03: 2 mg/h via INTRAVENOUS
  Administered 2016-02-03: 5 mg/h via INTRAVENOUS
  Filled 2016-02-01 (×7): qty 10

## 2016-02-01 MED ORDER — AMIODARONE HCL IN DEXTROSE 360-4.14 MG/200ML-% IV SOLN: 30.0000 mg/h | INTRAVENOUS | Status: DC

## 2016-02-01 MED ORDER — CISATRACURIUM BOLUS VIA INFUSION
0.0500 mg/kg | INTRAVENOUS | Status: DC | PRN
  Filled 2016-02-01: qty 6

## 2016-02-01 MED ORDER — PROPOFOL 1000 MG/100ML IV EMUL
5.0000 ug/kg/min | INTRAVENOUS | Status: DC
  Administered 2016-02-01: 50 ug/kg/min via INTRAVENOUS
  Administered 2016-02-01: 30 ug/kg/min via INTRAVENOUS
  Filled 2016-02-01 (×2): qty 100

## 2016-02-01 MED ORDER — FENTANYL CITRATE (PF) 2500 MCG/50ML IJ SOLN
100.0000 ug/h | INTRAMUSCULAR | Status: DC
  Administered 2016-02-01: 350 ug/h via INTRAVENOUS
  Administered 2016-02-02: 250 ug/h via INTRAVENOUS
  Administered 2016-02-02: 300 ug/h via INTRAVENOUS
  Administered 2016-02-03: 250 ug/h via INTRAVENOUS
  Administered 2016-02-03: 100 ug/h via INTRAVENOUS
  Administered 2016-02-04: 200 ug/h via INTRAVENOUS
  Administered 2016-02-04: 300 ug/h via INTRAVENOUS
  Administered 2016-02-05: 250 ug/h via INTRAVENOUS
  Filled 2016-02-01 (×8): qty 50

## 2016-02-01 MED ORDER — DEXTROSE 50 % IV SOLN: 1.0000 | Freq: Once | INTRAVENOUS | Status: DC

## 2016-02-01 MED ORDER — MIDAZOLAM HCL 2 MG/2ML IJ SOLN: 2.0000 mg | Freq: Once | INTRAMUSCULAR | Status: DC | PRN

## 2016-02-01 MED ORDER — ARTIFICIAL TEARS OP OINT
1.0000 "application " | TOPICAL_OINTMENT | Freq: Three times a day (TID) | OPHTHALMIC | Status: DC
  Administered 2016-02-01: 1 via OPHTHALMIC

## 2016-02-01 MED ORDER — PANTOPRAZOLE SODIUM 40 MG IV SOLR
40.0000 mg | Freq: Every day | INTRAVENOUS | Status: DC
  Administered 2016-02-01 – 2016-02-03 (×4): 40 mg via INTRAVENOUS
  Filled 2016-02-01 (×4): qty 40

## 2016-02-01 MED ORDER — SODIUM CHLORIDE 0.9 % IV SOLN: INTRAVENOUS | Status: AC

## 2016-02-01 MED ORDER — FENTANYL CITRATE (PF) 100 MCG/2ML IJ SOLN
50.0000 ug | Freq: Once | INTRAMUSCULAR | Status: AC
  Administered 2016-02-01: 50 ug via INTRAVENOUS

## 2016-02-01 MED ORDER — NOREPINEPHRINE BITARTRATE 1 MG/ML IV SOLN
0.0000 ug/min | INTRAVENOUS | Status: DC
  Administered 2016-02-01: 2 ug/min via INTRAVENOUS
  Filled 2016-02-01 (×2): qty 4

## 2016-02-01 MED ORDER — SODIUM CHLORIDE 0.9 % IV SOLN
25.0000 ug/h | INTRAVENOUS | Status: DC
  Administered 2016-02-01: 100 ug/h via INTRAVENOUS

## 2016-02-01 MED ORDER — POTASSIUM PHOSPHATES 15 MMOLE/5ML IV SOLN: 30.0000 mmol | Freq: Once | INTRAVENOUS | Status: DC

## 2016-02-01 MED ORDER — MIDAZOLAM HCL 2 MG/2ML IJ SOLN: 2.0000 mg | Freq: Once | INTRAMUSCULAR | Status: DC

## 2016-02-01 MED ORDER — AMIODARONE HCL IN DEXTROSE 360-4.14 MG/200ML-% IV SOLN
INTRAVENOUS | Status: AC
  Filled 2016-02-01: qty 200

## 2016-02-01 MED ORDER — FENTANYL CITRATE (PF) 100 MCG/2ML IJ SOLN: 50.0000 ug | Freq: Once | INTRAMUSCULAR | Status: DC

## 2016-02-01 MED ORDER — INSULIN ASPART 100 UNIT/ML ~~LOC~~ SOLN
2.0000 [IU] | SUBCUTANEOUS | Status: DC
  Administered 2016-02-01: 2 [IU] via SUBCUTANEOUS
  Administered 2016-02-01 (×5): 4 [IU] via SUBCUTANEOUS
  Administered 2016-02-03 – 2016-02-05 (×3): 2 [IU] via SUBCUTANEOUS
  Administered 2016-02-05 (×2): 4 [IU] via SUBCUTANEOUS
  Administered 2016-02-05: 2 [IU] via SUBCUTANEOUS
  Administered 2016-02-05: 4 [IU] via SUBCUTANEOUS
  Administered 2016-02-05: 2 [IU] via SUBCUTANEOUS
  Administered 2016-02-06 (×3): 4 [IU] via SUBCUTANEOUS
  Administered 2016-02-06 (×2): 2 [IU] via SUBCUTANEOUS
  Administered 2016-02-07: 4 [IU] via SUBCUTANEOUS
  Administered 2016-02-07 (×2): 2 [IU] via SUBCUTANEOUS
  Administered 2016-02-07: 4 [IU] via SUBCUTANEOUS
  Administered 2016-02-07: 2 [IU] via SUBCUTANEOUS
  Administered 2016-02-07 – 2016-02-08 (×2): 4 [IU] via SUBCUTANEOUS
  Administered 2016-02-08 – 2016-02-09 (×5): 2 [IU] via SUBCUTANEOUS
  Administered 2016-02-09: 4 [IU] via SUBCUTANEOUS
  Administered 2016-02-09: 2 [IU] via SUBCUTANEOUS
  Administered 2016-02-09: 4 [IU] via SUBCUTANEOUS
  Administered 2016-02-10 (×2): 2 [IU] via SUBCUTANEOUS
  Administered 2016-02-10: 6 [IU] via SUBCUTANEOUS
  Administered 2016-02-10: 2 [IU] via SUBCUTANEOUS

## 2016-02-01 MED ORDER — MAGNESIUM SULFATE 2 GM/50ML IV SOLN
2.0000 g | Freq: Once | INTRAVENOUS | Status: AC
  Administered 2016-02-01: 2 g via INTRAVENOUS
  Filled 2016-02-01: qty 50

## 2016-02-01 MED ORDER — CHLORHEXIDINE GLUCONATE 0.12% ORAL RINSE (MEDLINE KIT)
15.0000 mL | Freq: Two times a day (BID) | OROMUCOSAL | Status: DC
  Administered 2016-02-01: 15 mL via OROMUCOSAL

## 2016-02-01 MED ORDER — FENTANYL CITRATE (PF) 100 MCG/2ML IJ SOLN: 100.0000 ug | Freq: Once | INTRAMUSCULAR | Status: AC

## 2016-02-01 MED ORDER — FENTANYL BOLUS VIA INFUSION
50.0000 ug | INTRAVENOUS | Status: DC | PRN
  Administered 2016-02-03 (×3): 50 ug via INTRAVENOUS
  Filled 2016-02-01: qty 50

## 2016-02-01 MED ORDER — MIDAZOLAM BOLUS VIA INFUSION
2.0000 mg | INTRAVENOUS | Status: DC | PRN
  Administered 2016-02-03: 2 mg via INTRAVENOUS
  Filled 2016-02-01 (×2): qty 2

## 2016-02-01 MED ORDER — FUROSEMIDE 10 MG/ML IJ SOLN: 40.0000 mg | Freq: Once | INTRAMUSCULAR | Status: DC

## 2016-02-01 MED ORDER — POTASSIUM CHLORIDE 20 MEQ/15ML (10%) PO SOLN
40.0000 meq | Freq: Once | ORAL | Status: AC
  Administered 2016-02-01: 40 meq via ORAL
  Filled 2016-02-01: qty 30

## 2016-02-01 MED ORDER — POTASSIUM CHLORIDE 10 MEQ/50ML IV SOLN
10.0000 meq | INTRAVENOUS | Status: AC
  Administered 2016-02-01 (×2): 10 meq via INTRAVENOUS
  Filled 2016-02-01 (×2): qty 50

## 2016-02-01 MED ORDER — DOPAMINE-DEXTROSE 3.2-5 MG/ML-% IV SOLN
0.0000 ug/kg/min | INTRAVENOUS | Status: DC
  Administered 2016-02-01: 5 ug/kg/min via INTRAVENOUS
  Filled 2016-02-01: qty 250

## 2016-02-01 MED ORDER — SODIUM CHLORIDE 0.9 % IV SOLN
3.0000 ug/kg/min | INTRAVENOUS | Status: DC
  Administered 2016-02-01: 1.5 ug/kg/min via INTRAVENOUS
  Filled 2016-02-01: qty 20

## 2016-02-01 MED ORDER — LEVOTHYROXINE SODIUM 100 MCG IV SOLR
37.5000 ug | Freq: Every day | INTRAVENOUS | Status: DC
  Administered 2016-02-01 – 2016-02-03 (×3): 37.5 ug via INTRAVENOUS
  Filled 2016-02-01 (×3): qty 5

## 2016-02-01 MED ORDER — ANTISEPTIC ORAL RINSE SOLUTION (CORINZ)
7.0000 mL | OROMUCOSAL | Status: DC
  Administered 2016-02-01 – 2016-02-06 (×51): 7 mL via OROMUCOSAL

## 2016-02-01 MED ORDER — HEPARIN (PORCINE) IN NACL 100-0.45 UNIT/ML-% IJ SOLN
900.0000 [IU]/h | INTRAMUSCULAR | Status: DC
  Administered 2016-02-01: 800 [IU]/h via INTRAVENOUS
  Administered 2016-02-02: 900 [IU]/h via INTRAVENOUS
  Filled 2016-02-01 (×2): qty 250

## 2016-02-01 MED ORDER — INSULIN ASPART 100 UNIT/ML ~~LOC~~ SOLN: 10.0000 [IU] | Freq: Once | SUBCUTANEOUS | Status: DC

## 2016-02-01 MED ORDER — ARTIFICIAL TEARS OP OINT
1.0000 "application " | TOPICAL_OINTMENT | Freq: Three times a day (TID) | OPHTHALMIC | Status: DC
  Administered 2016-02-01 – 2016-02-02 (×4): 1 via OPHTHALMIC
  Filled 2016-02-01: qty 3.5

## 2016-02-01 MED ORDER — FENTANYL CITRATE (PF) 100 MCG/2ML IJ SOLN: 100.0000 ug | Freq: Once | INTRAMUSCULAR | Status: DC | PRN

## 2016-02-01 MED ORDER — FENTANYL BOLUS VIA INFUSION
25.0000 ug | INTRAVENOUS | Status: DC | PRN
  Filled 2016-02-01: qty 25

## 2016-02-01 NOTE — Progress Notes (Signed)
ANTICOAGULATION CONSULT NOTE - Initial Consult  Pharmacy Consult for heparin Indication: atrial fibrillation and on hypothermia protocol s/p cardiac arrest  Allergies  Allergen Reactions  . Lactose Intolerance (Gi) Diarrhea    Patient Measurements: Height: 5\' 9"  (175.3 cm) Weight: 247 lb 5.7 oz (112.2 kg) IBW/kg (Calculated) : 70.7 Heparin Dosing Weight: 95kg  Vital Signs: Temp: 91.8 F (33.2 C) (05/09 1000) Temp Source: Core (Comment) (05/09 1000) BP: 126/71 mmHg (05/09 1000) Pulse Rate: 53 (05/09 1000)  Labs:  Recent Labs  01/31/16 2206 02/01/16 0105 02/01/16 0116 02/01/16 0200 02/01/16 0320 02/01/16 0450 02/01/16 0653 02/01/16 0915 02/01/16 0916  HGB 13.2  --  13.3  --   --  11.6*  --   --   --   HCT 39.0  --  39.0  --   --  34.1*  --   --   --   PLT 267  --   --   --   --  213  --   --   --   APTT  --  25  --   --   --   --  42* 65*  --   LABPROT 15.7* 16.2*  --   --   --   --  18.2*  --   --   INR 1.23 1.29  --   --   --   --  1.51*  --   --   HEPARINUNFRC  --   --   --   --   --   --  0.97*  --  1.09*  CREATININE 2.17*  --  1.90* 1.79* 1.80* 1.64* 1.71*  --   --   TROPONINI <0.03  --   --  0.09*  --  0.11* 0.11*  --   --     Estimated Creatinine Clearance: 53.9 mL/min (by C-G formula based on Cr of 1.71).   Medical History: Past Medical History  Diagnosis Date  . Claustrophobia   . Heart murmur   . Hypertension   . Varicose vein of leg     right  . Dysrhythmia     "palpitations"  . Migraine 02/13/12    "opthalmic"  . Chronic lower back pain   . Exertional dyspnea 01/2012  . CHF (congestive heart failure) ([place])   . Hypothyroidism   . Chronic kidney disease     kidney fx studies increased      Assessment: 65yo male presents to hospital s/p cardiac arrest w/ shockable rhythm in the field, started hypothermia protocol (at goal temp at 0520 this AM), on heparin for Afib; currently anticoagulated w/ Eliquis though timing of last dose unclear.    HL elevated and APTT just slightly below the low end of goal range. We will continue dosing based on APTT for now.   Goal of Therapy:  Heparin level 0.3-0.7 units/ml aPTT 66-102 seconds Monitor platelets by anticoagulation protocol: Yes   Plan:  Increase heparin slightly to 900 units/hr F/u APTT in 6 hours HL/APTT and CBC in AM  Monitor for s/sx bleeding Rewarming to begin at [id number] tomorrow which will affect HL  Joya San, PharmD Clinical Pharmacy Resident Pager # [phone] 02/01/2016 11:05 AM

## 2016-02-01 NOTE — Progress Notes (Addendum)
   Spend time with the family  Review the patient's clinical data  Currently having heart rates into the 30s with second-degree AV block. Marked prolongation of the QT interval. Potassium is 3.0 and currently being treated with runs.  Plan discontinue IV amiodarone. Watch rhythm, especially for increase in ventricular ectopy. Replete potassium. Hopefully with discontinuation of amiodarone, bradycardia due to AV block will improve. Beta blocker is already on hold.

## 2016-02-01 NOTE — Progress Notes (Signed)
Patient has been in a regular sinus rhythm for approximately an hour now. Heart Rate in the 60's, regular with little ectopy at this time.  Will continue to monitor any changes. Gildardo Cranker, RN

## 2016-02-01 NOTE — Procedures (Signed)
Central Venous Catheter Insertion Procedure Note Eric [name] [id number] September 07, 1951  Procedure: Insertion of Central Venous Catheter Indications: Assessment of intravascular volume, Drug and/or fluid administration and Frequent blood sampling  Procedure Details Consent: Risks of procedure as well as the alternatives and risks of each were explained to the (patient/caregiver).  Consent for procedure obtained. Time Out: Verified patient identification, verified procedure, site/side was marked, verified correct patient position, special equipment/implants available, medications/allergies/relevent history reviewed, required imaging and test results available.  Performed  Maximum sterile technique was used including antiseptics, cap, gloves, gown, hand hygiene, mask and sheet. Skin prep: Chlorhexidine; local anesthetic administered A antimicrobial bonded/coated triple lumen catheter was placed in the left internal jugular vein using the Seldinger technique.  Evaluation Blood flow good Complications: No apparent complications Patient did tolerate procedure well. Chest X-ray ordered to verify placement.  CXR: pending.  Procedure performed under direct ultrasound guidance for real time vessel cannulation.      Montey Hora, [facility] Pager: [phone]  or [phone] 02/01/2016, 2:07 AM   Lavon Paganini. Titus Mould, MD, [place] Pgr: [facility]

## 2016-02-01 NOTE — Progress Notes (Signed)
EEG completed; results pending.    

## 2016-02-01 NOTE — Progress Notes (Signed)
Patient transported on vent from ED to [id number] without complications.  Vent set back up in room, report given to receiving RT.

## 2016-02-01 NOTE — Care Management Note (Signed)
Case Management Note  Patient Details  Name: Eric [name] MRN: [id number] Date of Birth: April 09, 1951  Subjective/Objective:   Adm w cardiac arrest on vent                 Action/Plan: lives w wife, pcp dr Glendale Chard   Expected Discharge Date:                  Expected Discharge Plan:  [place]  In-House Referral:     Discharge planning Services     Post Acute Care Choice:    Choice offered to:     DME Arranged:    DME Agency:     HH Arranged:    [place] Agency:     Status of Service:     Medicare Important Message Given:    Date Medicare IM Given:    Medicare IM give by:    Date Additional Medicare IM Given:    Additional Medicare Important Message give by:     If discussed at [place] of Stay Meetings, dates discussed:    Additional Comments: ur review done  Lacretia Leigh, RN 02/01/2016, 7:24 AM

## 2016-02-01 NOTE — Progress Notes (Signed)
ANTICOAGULATION CONSULT NOTE - Initial Consult  Pharmacy Consult for heparin Indication: atrial fibrillation and on hypothermia protocol s/p cardiac arrest  Allergies  Allergen Reactions  . Lactose Intolerance (Gi) Diarrhea    Patient Measurements: Height: 5\' 9"  (175.3 cm) Weight: 242 lb 1 oz (109.8 kg) IBW/kg (Calculated) : 70.7 Heparin Dosing Weight: 95kg  Vital Signs: Temp: 97.2 F (36.2 C) (05/08 2315) BP: 167/83 mmHg (05/08 2350) Pulse Rate: 72 (05/08 2350)  Labs:  Recent Labs  01/31/16 2206  HGB 13.2  HCT 39.0  PLT 267  LABPROT 15.7*  INR 1.23  CREATININE 2.17*  TROPONINI <0.03    Estimated Creatinine Clearance: 42 mL/min (by C-G formula based on Cr of 2.17).   Medical History: Past Medical History  Diagnosis Date  . Claustrophobia   . Heart murmur   . Hypertension   . Varicose vein of leg     right  . Dysrhythmia     "palpitations"  . Migraine 02/13/12    "opthalmic"  . Chronic lower back pain   . Exertional dyspnea 01/2012  . CHF (congestive heart failure) ([place])   . Hypothyroidism   . Chronic kidney disease     kidney fx studies increased      Assessment: 66yo male presents to hospital s/p cardiac arrest w/ shockable rhythm in the field, now starting hypothermia protocol, to begin heparin for Afib; currently anticoagulated w/ Eliquis though timing of last dose unclear.  Goal of Therapy:  Heparin level 0.3-0.7 units/ml aPTT 66-102 seconds Monitor platelets by anticoagulation protocol: Yes   Plan:  Will begin heparin gtt at low rate of 800 units/hr given hypothermia and monitor heparin levels and CBC.  Wynona Neat, PharmD, BCPS  02/01/2016,12:07 AM

## 2016-02-01 NOTE — Progress Notes (Signed)
eLink Physician-Brief Progress Note Patient Name: Eric [name] DOB: 09-Jun-1951 MRN: [id number]   Date of Service  02/01/2016  HPI/Events of Note  Phos 1.6, K 3.6, mg 1.8. S/p arrest.   eICU Interventions  Correct lytes.      Intervention Category Intermediate Interventions: Other:  [place] 02/01/2016, 6:42 AM

## 2016-02-01 NOTE — Progress Notes (Signed)
eLink Physician-Brief Progress Note Patient Name: Eric [name] DOB: 1951/06/11 MRN: [id number]   Date of Service  02/01/2016  HPI/Events of Note  Bradycardia - Currently on Coreg (held), hypothermia (Temp being raised to 36) and Amiodarone IV infusion. Patient is also on a Dopamine IV infusion to raise the HR.  eICU Interventions  Would hold Amiodarone IV infusion if OK with cardiology and then wean Dopamine as tolerated.      Intervention Category Major Interventions: Arrhythmia - evaluation and management  [place],Steven Eugene 02/01/2016, 4:50 PM

## 2016-02-01 NOTE — Progress Notes (Signed)
Elink MD (Dr. Emmit Alexanders) notified re: pt's rythmn, deferred to cards. Cardiology paged, awaiting response. Will monitor.  Dr. Tamala Julian at bedside, updated re pt's status: Junky Rythmn, EKG obtained. Awaiting further orders  Amio gtt discontinued. Will monitor.

## 2016-02-01 NOTE — H&P (Signed)
PULMONARY / CRITICAL CARE MEDICINE   Name: Eric [name] MRN: [id number] DOB: Apr 23, 1951    ADMISSION DATE:  01/31/2016 CONSULTATION DATE:  01/31/16  REFERRING MD:  EDP  CHIEF COMPLAINT:  Cardiac Arrest  HISTORY OF PRESENT ILLNESS:  Pt is encephelopathic; therefore, this HPI is obtained from chart review. Eric [name] is a 65 y.o. male with PMH as outlined below including known systolic heart failure and A.fib on Eliquis.  He was in his USOH until evening of 05/08 when he was with his wife at [place].  As they were leaving [employer], he began driving the car and while still in the parking lot, he began to breath very heavily before becoming unresponsive.  His wife immediately got the car stopped, got him out of the car and began CPR while calling EMS.  She feels that EMS arrived within 5 - 10 minutes and upon their arrival, he received 2 defibrillations via AED.  ROSC was achieved within roughly 15 - 20 minutes.  Upon ED arrival, he remained unresponsive and was subsequently intubated.  PCCM was called for admission and consideration of hypothermia protocol.  Cardiology was also called in consultation.  Per his wife, he had not had any complaints recently and had behaved completely normal.  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  PAST MEDICAL HISTORY :  He  has a past medical history of Claustrophobia; Heart murmur; Hypertension; Varicose vein of leg; Dysrhythmia; Migraine (02/13/12); Chronic lower back pain; Exertional dyspnea (01/2012); CHF (congestive heart failure) ([place]); Hypothyroidism; and Chronic kidney disease.  PAST SURGICAL HISTORY: He  has past surgical history that includes Skin melanocytoma excision (2012); Finger surgery (2012); TEE without cardioversion (03/22/2012); Cardioversion (03/22/2012); Cardioversion (04/19/2012); Colonoscopy (N/A, 04/11/2013); and Colonoscopy (N/A, 04/11/2013).  Allergies  Allergen Reactions  . Lactose  Intolerance (Gi) Diarrhea    No current facility-administered medications on file prior to encounter.   Current Outpatient Prescriptions on File Prior to Encounter  Medication Sig  . atorvastatin (LIPITOR) 20 MG tablet TAKE 1 TABLET BY MOUTH DAILY  . b complex vitamins tablet Take 1 tablet by mouth daily as needed (cold/ energy).   . carvedilol (COREG) 25 MG tablet TAKE 1 TABLET BY MOUTH TWICE DAILY  . Cholecalciferol 2000 units CAPS Take 2,000 Units by mouth 3 (three) times a week.  . Coenzyme Q10 (COQ10) 200 MG CAPS Take 200 mg by mouth daily.  Marland Kitchen ELIQUIS 5 MG TABS tablet TAKE 1 TABLET BY MOUTH TWICE DAILY  . fluticasone (FLONASE) 50 MCG/ACT nasal spray Place 2 sprays into the nose as needed for allergies.   . furosemide (LASIX) 40 MG tablet TAKE 1 TABLET BY MOUTH DAILY  . isosorbide-hydrALAZINE (BIDIL) 20-37.5 MG per tablet Take 1 tablet by mouth 2 (two) times daily.  Marland Kitchen levothyroxine (SYNTHROID, LEVOTHROID) 75 MCG tablet Take 1 tablet (75 mcg total) by mouth daily before breakfast.  . magnesium oxide (MAG-OX) 400 MG tablet Take 400 mg by mouth daily.  Marland Kitchen spironolactone (ALDACTONE) 25 MG tablet TAKE 1 TABLET BY MOUTH EVERY DAY    FAMILY HISTORY:  His indicated that his mother is deceased. He indicated that his father is deceased.   SOCIAL HISTORY: He  reports that he has never smoked. He has never used smokeless tobacco. He reports that he drinks alcohol. He reports that he does not use illicit drugs.  REVIEW OF SYSTEMS:  Unable to obtain as pt is encephalopathic.  SUBJECTIVE:  On vent, unresponsive  though now on propofol infusion.  VITAL SIGNS: BP 128/92 mmHg  Pulse 73  Temp(Src) 97.2 F (36.2 C)  Resp 19  Ht 5\' 9"  (1.753 m)  Wt 242 lb 1 oz (109.8 kg)  BMI 35.73 kg/m2  SpO2 100%  HEMODYNAMICS:    VENTILATOR SETTINGS: Vent Mode:  [-] PRVC FiO2 (%):  [60 %-100 %] 60 % Set Rate:  [18 bmp] 18 bmp Vt Set:  [600 mL] 600 mL PEEP:  [5 cmH20] 5 cmH20 Plateau Pressure:  [27  cmH20] 27 cmH20  INTAKE / OUTPUT:     PHYSICAL EXAMINATION: General: Adult male, in NAD. Neuro: Sedated, non-responsive.  HEENT: [place]/AT. Pupils pinpoint and sluggish, sclerae anicteric. Cardiovascular: RRR, no M/R/G.  Lungs: Respirations even and unlabored.  CTA bilaterally, No W/R/R.  Abdomen: BS x 4, soft, NT/ND.  Musculoskeletal: No gross deformities, no edema.  Skin: Intact, warm, no rashes.  LABS:  BMET  Recent Labs Lab 01/31/16 2206  NA 137  K 4.9  CL 108  CO2 18*  BUN 30*  CREATININE 2.17*  GLUCOSE 157*    Electrolytes  Recent Labs Lab 01/31/16 2206  CALCIUM 9.1    CBC  Recent Labs Lab 01/31/16 2206  WBC 8.0  HGB 13.2  HCT 39.0  PLT 267    Coag's  Recent Labs Lab 01/31/16 2206  INR 1.23    Sepsis Markers No results for input(s): LATICACIDVEN, PROCALCITON, O2SATVEN in the last 168 hours.  ABG  Recent Labs Lab 01/31/16 2308  PHART 7.343*  PCO2ART 43.2  PO2ART 286.0*    Liver Enzymes  Recent Labs Lab 01/31/16 2206  AST 140*  ALT 84*  ALKPHOS 51  BILITOT 1.2  ALBUMIN 3.6    Cardiac Enzymes  Recent Labs Lab 01/31/16 2206  TROPONINI <0.03    Glucose  Recent Labs Lab 01/31/16 2211  GLUCAP 143*    Imaging Dg Chest Port 1 View  01/31/2016  CLINICAL DATA:  Respiratory distress for 1 day. EXAM: PORTABLE CHEST 1 VIEW COMPARISON:  10/11/2012 FINDINGS: The endotracheal tube is 4.5 cm above the carina. No consolidation. No large effusions. No pneumothorax. IMPRESSION: Satisfactorily positioned ETT. Electronically Signed   By: Andreas Newport M.D.   On: 01/31/2016 22:25     STUDIES:  CXR 05/08 > no acute process. CT head 05/08 >  CULTURES: Blood 05/08 > Urine 05/08 > Sputum 05/08 >  ANTIBIOTICS: None.  SIGNIFICANT EVENTS: 05/08 > admitted after cardiac arrest.  Started on TTM - 33 degrees C.  LINES/TUBES: ETT 05/08 > R radial a line 05/08 > CVL pending 05/08 >  DISCUSSION: 65 y.o. F with known sCHF  and A.fib, presented 05/08 after out of hospital cardiac arrest.  ROSC within 15-20 minutes after defib x 2.  Started on hypothermia protocol, 33 degrees celsius.  ASSESSMENT / PLAN:  CARDIOVASCULAR A:  Cardiac arrest - unclear etiology at this point. Hx HTN, A.fib on Eliquis, sCHF (EF 40-45% per echo from April 2017). P:  Start TTM - 33 degrees celsius. Goal MAP > 80 while cooling. Levophed PRN for above goal. Trend troponin, lactate. Assess echo. Heparin gtt in lieu of outpatient eliquis. Cards following, appreciate the assistance. Continue outpatient atorvastatin. Hold outpatient carvedilol, eliquis, furosemide, bidil, spironolactone.  NEUROLOGIC A:   Acute encephalopathy - concern for anoxia given exam findings. Hx migraines, chronic lower back pain. P:   Sedation:  Cisatracurium gtt / Propofol gtt / Fentanyl gtt. RASS goal: -5 until off paralytics. Hold WUA until off paralytics.  Assess EEG. May need neuro consult.  PULMONARY A: Acute hypoxic respiratory failure - in the setting of cardiac arrest. P:   Full vent support. Wean as able. VAP prevention measures. Hold SBT until off paralytics. CXR in AM.  RENAL A:   AoCKD. Anticipate multiple metabolic derangements during cooling. P:   Correct electrolytes as indicated. BMP q2hrs x 4.  GASTROINTESTINAL A:   GI prophylaxis. Nutrition. P:  SUP: Pantoprazole. NPO.  HEMATOLOGIC A:   VTE Prophylaxis. P:  SCD's / Heparin gtt. Coags q8hrs x 2. CBC in AM.  INFECTIOUS A:   No indication of infection. P:   Monitor clinically.  ENDOCRINE A:   Hypothyroidism. Anticipate hyperglycemia during cooling. P:   Continue outpatient synthroid. Assess TSH. ICU hyperglycemia protocol.   Family updated: Wife updated.  Interdisciplinary Family Meeting v Palliative Care Meeting:  Due by: 02/07/16.  CC time: 40 minutes.   Montey Hora, [facility] Pager: [phone]  or [phone] 02/01/2016, 12:14 AM   Attending Note:  I have examined patient, reviewed labs, studies and notes. I have discussed the case with [place], and I agree with the data and plans as amended above. 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 Etiology unclear at this time although AED at the scene did recommend and perform shocks / defib. On my evaluation he is unresponsive, but this is on propofol. He is hemodynamically stable, in fact slightly hypertensive. His down time was estimated at 15-20 minutes. I believe he meets criteria for hypothermia to 33C. We will initiate in the ED. He has a LBBB (old), cardiology following. Unclear whether there is any indication to go for cardiac cath immediately. Appreciate their assistance. Start heparin for A fib, possibility of ACS.  Independent critical care time is 50 minutes.   Baltazar Apo, MD, PhD 02/01/2016, 12:54 AM [facility] [phone] or if no answer [phone]

## 2016-02-01 NOTE — Procedures (Signed)
HPI:  65 y/o male, s/p arrest, now on hypothermia protocol.    TECHNICAL SUMMARY:  A multichannel referential and bipolar montage EEG using the standard international 10-20 system was performed on the patient described as unresponsive.  EEG is done while sedated on fentanyl.  There is no occipital dominant rhythm.  The background activity consists primarily of a burst/suppression pattern, with approximate 2-4 seconds of burst activity, followed by 2-4 seconds of suppressive activity.  ACTIVATION:  Stepwise photic stimulation and hyperventilation were not performed.  No stimulus was applied to the patient to determine reactivity.  EPILEPTIFORM ACTIVITY:  There were no spikes, sharp waves or paroxysmal activity.   IMPRESSION:  This EEG demonstrated no focal, hemispheric, or lateralizing features.  This EEG demonstrated a burst/suppression pattern, which can be consistent with a sedated EEG.  There was no epileptiform activity recorded on this EEG.  Immediately helpful to repeat this EEG when the patient is able to get off of sedation.

## 2016-02-01 NOTE — Progress Notes (Signed)
  Echocardiogram 2D Echocardiogram has been performed.  Darlina Sicilian M 02/01/2016, 8:50 AM

## 2016-02-01 NOTE — Progress Notes (Signed)
ANTICOAGULATION CONSULT NOTE - Initial Consult  Pharmacy Consult for heparin Indication: atrial fibrillation and on hypothermia protocol s/p cardiac arrest  Allergies  Allergen Reactions  . Lactose Intolerance (Gi) Diarrhea    Patient Measurements: Height: 5\' 9"  (175.3 cm) Weight: 247 lb 5.7 oz (112.2 kg) IBW/kg (Calculated) : 70.7 Heparin Dosing Weight: 95kg  Vital Signs: Temp: 92.8 F (33.8 C) (05/09 1800) Temp Source: Core (Comment) (05/09 1800) BP: 100/61 mmHg (05/09 1800) Pulse Rate: 56 (05/09 1800)  Labs:  Recent Labs  01/31/16 2206  02/01/16 0105 02/01/16 0116  02/01/16 0450 02/01/16 0653 02/01/16 0915 02/01/16 0916 02/01/16 1115 02/01/16 1215 02/01/16 1759  HGB 13.2  --   --  13.3  --  11.6*  --   --   --   --   --   --   HCT 39.0  --   --  39.0  --  34.1*  --   --   --   --   --   --   PLT 267  --   --   --   --  213  --   --   --   --   --   --   APTT  --   < > 25  --   --   --  42* 65*  --   --   --  85*  LABPROT 15.7*  --  16.2*  --   --   --  18.2*  --   --  18.2*  --   --   INR 1.23  --  1.29  --   --   --  1.51*  --   --  1.50*  --   --   HEPARINUNFRC  --   --   --   --   --   --  0.97*  --  1.09*  --   --   --   CREATININE 2.17*  --   --  1.90*  < > 1.64* 1.71*  --   --   --  1.65*  --   TROPONINI <0.03  --   --   --   < > 0.11* 0.11*  --   --   --  0.08*  --   < > = values in this interval not displayed.  Estimated Creatinine Clearance: 55.8 mL/min (by C-G formula based on Cr of 1.65).   Medical History: Past Medical History  Diagnosis Date  . Claustrophobia   . Heart murmur   . Hypertension   . Varicose vein of leg     right  . Dysrhythmia     "palpitations"  . Migraine 02/13/12    "opthalmic"  . Chronic lower back pain   . Exertional dyspnea 01/2012  . CHF (congestive heart failure) ([place])   . Hypothyroidism   . Chronic kidney disease     kidney fx studies increased      Assessment: 65yo male presents to hospital s/p cardiac  arrest w/ shockable rhythm in the field, started hypothermia protocol (at goal temp at 0520 this AM), on heparin for Afib; currently anticoagulated w/ Eliquis though timing of last dose unclear.   Repeat aPTT is now therapeutic at 85 sec on heparin 900 units/hr. No issues with infusion or bleeding noted.  Goal of Therapy:  Heparin level 0.3-0.7 units/ml aPTT 66-102 seconds Monitor platelets by anticoagulation protocol: Yes   Plan:  Continue heparin 900 units/hr HL/APTT and CBC in AM  Monitor for s/sx bleeding  Andrey Cota. Diona Foley, PharmD, [facility] Pharmacist Pager [phone]  02/01/2016 6:23 PM

## 2016-02-01 NOTE — Progress Notes (Signed)
Chaplain responded to request to referral from [facility] director to visit with patient. Upon my arrival patient wife was at bedside and welcomed chaplain visit.  Patient wife asked that I pray for the both of them.   I prayed and provided emotional and spiritual support as well as empathetic listening.Wife requested addition visit when possible.. 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 Pt son lives in [place] and is in route to [place].  Chaplain is available as needed.  Terrall Bley L. Oswaldo Milian,  M.Div,[facility] Pager [phone]

## 2016-02-01 NOTE — Progress Notes (Signed)
Patient ID: Eric [name], male   DOB: 19-Oct-1950, 65 y.o.   MRN: [id number]    Subjective:  Intubated cooled unresponsive  Objective:  Filed Vitals:   02/01/16 0630 02/01/16 0700 02/01/16 0730 02/01/16 0742  BP:  118/57    Pulse: 36 44 41   Temp: 89.6 F (32 C) 90.9 F (32.7 C)  90.9 F (32.7 C)  TempSrc: Core (Comment) Core (Comment)  Core (Comment)  Resp: 18 18 18    Height:      Weight:      SpO2: 100% 100% 100%     Intake/Output from previous day:  Intake/Output Summary (Last 24 hours) at 02/01/16 0750 Last data filed at 02/01/16 [id number]  Gross per 24 hour  Intake 2810.09 ml  Output    455 ml  Net 2355.09 ml    Physical Exam: Sedated Obese black male  HEENT: ETT tip above carina by xray NG tube  Neck supple with no adenopathy JVP normal no bruits no thyromegaly Lungs clear with no wheezing and good diaphragmatic motion Heart:  S1/S2 no murmur, no rub, gallop or click PMI normal Abdomen: benighn, BS positve, no tenderness, no AAA cooling pads on  no bruit.  No HSM or HJR Distal pulses intact with no bruits cooling pads on  No edema Neuro non-focal Skin warm and dry No muscular weakness   Lab Results: Basic Metabolic Panel:  Recent Labs  02/01/16 0320 02/01/16 0450  NA 138 139  K 3.8 3.6  CL 109 108  CO2 17* 16*  GLUCOSE 176* 171*  BUN 29* 28*  CREATININE 1.80* 1.64*  CALCIUM 8.6* 8.6*  MG  --  1.8  PHOS  --  1.6*   Liver Function Tests:  Recent Labs  01/31/16 2206  AST 140*  ALT 84*  ALKPHOS 51  BILITOT 1.2  PROT 7.4  ALBUMIN 3.6   CBC:  Recent Labs  01/31/16 2206 02/01/16 0116 02/01/16 0450  WBC 8.0  --  7.9  NEUTROABS 4.7  --   --   HGB 13.2 13.3 11.6*  HCT 39.0 39.0 34.1*  MCV 94.2  --  92.2  PLT 267  --  213   Cardiac Enzymes:  Recent Labs  01/31/16 2206 02/01/16 0200 02/01/16 0450  TROPONINI <0.03 0.09* 0.11*   Fasting Lipid Panel:  Recent Labs  02/01/16 0105  TRIG 82   Thyroid Function  Tests:  Recent Labs  02/01/16 0105  TSH 2.890    Imaging: Ct Head Wo Contrast  02/01/2016  CLINICAL DATA:  Cardiac arrest EXAM: CT HEAD WITHOUT CONTRAST TECHNIQUE: Contiguous axial images were obtained from the base of the skull through the vertex without intravenous contrast. COMPARISON:  None. FINDINGS: There is no intracranial hemorrhage, mass or evidence of acute infarction. There is no extra-axial fluid collection. Gray matter and white matter appear normal. Cerebral volume is normal for age. Brainstem and posterior fossa are unremarkable. The CSF spaces appear normal. The bony structures are intact. The visible portions of the paranasal sinuses are clear. The orbits are unremarkable. IMPRESSION: Normal brain Electronically Signed   By: Andreas Newport M.D.   On: 02/01/2016 00:49   Dg Chest Port 1 View  02/01/2016  CLINICAL DATA:  Central line placement EXAM: PORTABLE CHEST 1 VIEW COMPARISON:  01/31/2016 FINDINGS: The endotracheal tube tip is 3.2 cm above the carina. There is a new left jugular central line which extends to the expected location of the SVC 1.5 cm below the azygos vein junction.  The nasogastric tube extends into the stomach and beyond the inferior edge of the image. A percutaneous pacing patch superimposes the left hemi thorax. Airspace opacities are present in the left central lung. This could represent pneumonia, hemorrhage, aspiration. Right lung is clear. No pneumothorax. IMPRESSION: Support equipment appears satisfactorily positioned. Central left lung airspace opacity, appears worsened. Electronically Signed   By: Andreas Newport M.D.   On: 02/01/2016 02:35   Dg Chest Port 1 View  01/31/2016  CLINICAL DATA:  Respiratory distress for 1 day. EXAM: PORTABLE CHEST 1 VIEW COMPARISON:  10/11/2012 FINDINGS: The endotracheal tube is 4.5 cm above the carina. No consolidation. No large effusions. No pneumothorax. IMPRESSION: Satisfactorily positioned ETT. Electronically Signed   By:  Andreas Newport M.D.   On: 01/31/2016 22:25    Cardiac Studies:  ECG: SR first degree LBBB   Telemetry: No VT SR  02/01/2016   Echo: Recent EF 40-45% pending new in hospital   Medications:   . artificial tears  1 application Both Eyes [id number]  . atorvastatin  20 mg Per Tube q1800  . insulin aspart  2-6 Units Subcutaneous Q4H  . levothyroxine  37.5 mcg Intravenous Daily  . magnesium sulfate 1 - 4 g bolus IVPB  2 g Intravenous Once  . pantoprazole (PROTONIX) IV  40 mg Intravenous QHS  . sodium phosphate  Dextrose 5% IVPB  30 mmol Intravenous Once     . sodium chloride    . cisatracurium (NIMBEX) infusion 1.5 mcg/kg/min (02/01/16 0200)  . fentaNYL infusion INTRAVENOUS 350 mcg/hr (02/01/16 0600)  . heparin 800 Units/hr (02/01/16 0234)  . norepinephrine (LEVOPHED) Adult infusion    . propofol (DIPRIVAN) infusion 50 mcg/kg/min (02/01/16 0705)    Assessment/Plan:  Cardiac arrest:  Etiology not clear AID indicated shockable rhythm but he has history of flutter/fib and LBBB  Enzymes not very elevated  No indication for cath Continue supportive care and cooling.  Will need cath at some point Bedside echo today Afib/flutter: on eliquis has been started on heparin follow heparin level and PTT Aortic Aneurysm:  4.9 cm CXR does not support acute dissection but should have CT after extubated and Cr improved Pulmonary:  On vent sedated per CCM Thyroid:  TSH is normal Chol:  On statin   [name] 02/01/2016, 7:50 AM

## 2016-02-01 NOTE — Progress Notes (Signed)
PULMONARY / CRITICAL CARE MEDICINE   Name: Eric [name] MRN: [id number] DOB: 08-13-1951    ADMISSION DATE:  01/31/2016 CONSULTATION DATE:  01/31/16  REFERRING MD:  EDP  CHIEF COMPLAINT:  Cardiac Arrest  HISTORY OF PRESENT ILLNESS:  Pt is encephelopathic; therefore, this HPI is obtained from chart review. Eric [name] is a 65 y.o. male with PMH as outlined below including known systolic heart failure and A.fib on Eliquis.  He was in his USOH until evening of 05/08 when he was with his wife at [place].  As they were leaving [employer], he began driving the car and while still in the parking lot, he began to breath very heavily before becoming unresponsive.  His wife immediately got the car stopped, got him out of the car and began CPR while calling EMS.  She feels that EMS arrived within 5 - 10 minutes and upon their arrival, he received 2 defibrillations via AED.  ROSC was achieved within roughly 15 - 20 minutes.  Upon ED arrival, he remained unresponsive and was subsequently intubated.  PCCM was called for admission and consideration of hypothermia protocol.  Cardiology was also called in consultation.  Per his wife, he had not had any complaints recently and had behaved completely normal.  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   SUBJECTIVE:  On vent unresponsive currently sedated with propofol, nimbex, and fentanyl drip.  Spoke with RN hypothermic protocol in place goal temp of 33 degrees C reached at 05:20 am 5/9  VITAL SIGNS: BP 125/61 mmHg  Pulse 55  Temp(Src) 91 F (32.8 C) (Core (Comment))  Resp 18  Ht 5\' 9"  (1.753 m)  Wt 247 lb 5.7 oz (112.2 kg)  BMI 36.51 kg/m2  SpO2 100%  HEMODYNAMICS: CVP:  [6 mmHg-10 mmHg] 6 mmHg  VENTILATOR SETTINGS: Vent Mode:  [-] PRVC FiO2 (%):  [40 %-100 %] 40 % Set Rate:  [18 bmp] 18 bmp Vt Set:  [600 mL] 600 mL PEEP:  [5 cmH20] 5 cmH20 Plateau Pressure:  [18 cmH20-27 cmH20] 18  cmH20  INTAKE / OUTPUT: I/O last 3 completed shifts: In: 2760.1 [I.V.:2760.1] Out: 455 [Urine:455]   PHYSICAL EXAMINATION: General: Adult male, in NAD. Neuro: Sedated, non-responsive.  HEENT: [place]/AT. Pupils pinpoint and sluggish 30mm, sclerae anicteric. Cardiovascular: bradycardia with LBBB, no M/R/G.  Lungs: Respirations even and unlabored.  CTA bilaterally, No W/R/R.  Abdomen: BS x 4, soft, NT/ND.  Musculoskeletal: No gross deformities, no edema.  Skin: Intact, warm, no rashes.  LABS:  BMET  Recent Labs Lab 02/01/16 0320 02/01/16 0450 02/01/16 0653  NA 138 139 139  K 3.8 3.6 3.6  CL 109 108 108  CO2 17* 16* 16*  BUN 29* 28* 28*  CREATININE 1.80* 1.64* 1.71*  GLUCOSE 176* 171* 173*    Electrolytes  Recent Labs Lab 02/01/16 0320 02/01/16 0450 02/01/16 0653  CALCIUM 8.6* 8.6* 8.5*  MG  --  1.8  --   PHOS  --  1.6*  --     CBC  Recent Labs Lab 01/31/16 2206 02/01/16 0116 02/01/16 0450  WBC 8.0  --  7.9  HGB 13.2 13.3 11.6*  HCT 39.0 39.0 34.1*  PLT 267  --  213    Coag's  Recent Labs Lab 01/31/16 2206 02/01/16 0105 02/01/16 0653  APTT  --  25 42*  INR 1.23 1.29 1.51*    Sepsis Markers  Recent Labs Lab 02/01/16 0200 02/01/16 0450  LATICACIDVEN  1.2 1.4    ABG  Recent Labs Lab 01/31/16 2308  PHART 7.343*  PCO2ART 43.2  PO2ART 286.0*    Liver Enzymes  Recent Labs Lab 01/31/16 2206  AST 140*  ALT 84*  ALKPHOS 51  BILITOT 1.2  ALBUMIN 3.6    Cardiac Enzymes  Recent Labs Lab 02/01/16 0200 02/01/16 0450 02/01/16 0653  TROPONINI 0.09* 0.11* 0.11*    Glucose  Recent Labs Lab 02/01/16 0114 02/01/16 0238 02/01/16 0326 02/01/16 0435 02/01/16 0533 02/01/16 0742  GLUCAP 153* 171* 171* 165* 172* 156*    Imaging Ct Head Wo Contrast  02/01/2016  CLINICAL DATA:  Cardiac arrest EXAM: CT HEAD WITHOUT CONTRAST TECHNIQUE: Contiguous axial images were obtained from the base of the skull through the vertex without  intravenous contrast. COMPARISON:  None. FINDINGS: There is no intracranial hemorrhage, mass or evidence of acute infarction. There is no extra-axial fluid collection. Gray matter and white matter appear normal. Cerebral volume is normal for age. Brainstem and posterior fossa are unremarkable. The CSF spaces appear normal. The bony structures are intact. The visible portions of the paranasal sinuses are clear. The orbits are unremarkable. IMPRESSION: Normal brain Electronically Signed   By: Andreas Newport M.D.   On: 02/01/2016 00:49   Dg Chest Port 1 View  02/01/2016  CLINICAL DATA:  Central line placement EXAM: PORTABLE CHEST 1 VIEW COMPARISON:  01/31/2016 FINDINGS: The endotracheal tube tip is 3.2 cm above the carina. There is a new left jugular central line which extends to the expected location of the SVC 1.5 cm below the azygos vein junction. The nasogastric tube extends into the stomach and beyond the inferior edge of the image. A percutaneous pacing patch superimposes the left hemi thorax. Airspace opacities are present in the left central lung. This could represent pneumonia, hemorrhage, aspiration. Right lung is clear. No pneumothorax. IMPRESSION: Support equipment appears satisfactorily positioned. Central left lung airspace opacity, appears worsened. Electronically Signed   By: Andreas Newport M.D.   On: 02/01/2016 02:35   Dg Chest Port 1 View  01/31/2016  CLINICAL DATA:  Respiratory distress for 1 day. EXAM: PORTABLE CHEST 1 VIEW COMPARISON:  10/11/2012 FINDINGS: The endotracheal tube is 4.5 cm above the carina. No consolidation. No large effusions. No pneumothorax. IMPRESSION: Satisfactorily positioned ETT. Electronically Signed   By: Andreas Newport M.D.   On: 01/31/2016 22:25     STUDIES:  CXR 05/08 >>no acute process. CT head 05/08 >>no acute process Echo>> EEG>>  CULTURES: Blood 05/08 >> Urine 05/08 >> Sputum 05/08 >> MRSA PCR 5/9>>  ANTIBIOTICS: None.  SIGNIFICANT  EVENTS: 05/08 > admitted after cardiac arrest.  Started on TTM - 33 degrees C.  LINES/TUBES: ETT 05/08 > R radial a line 05/08 > Right IJ CVL 05/08 >  DISCUSSION: 65 y.o. M with known sCHF and A.fib, presented 05/08 after out of hospital cardiac arrest.  ROSC within 15-20 minutes after defib x 2.  Started on hypothermia protocol, 33 degrees celsius  ASSESSMENT / PLAN:  CARDIOVASCULAR A:  Cardiac arrest - VF got shocked, r/o ischemia Aortic aneurysm Hx HTN, A.fib on Eliquis, sCHF (EF 40-45% per echo from April 2017) P:  Continue TTM - 33 degrees celsius Goal MAP > 80 while cooling Levophed PRN for above goal Trend troponin, lactate Assess echo Heparin gtt in lieu of outpatient eliquis Cards following, appreciate the assistance 4.9 cm aortic aneurysm per cards will need CT of chest once extubated and Cr improved  Continue outpatient atorvastatin Hold  outpatient carvedilol, eliquis, furosemide, bidil, spironolactone  NEUROLOGIC A:   Acute encephalopathy - concern for anoxia given exam findings. Hx migraines, chronic lower back pain. P:   Sedation:  Cisatracurium gtt / Propofol gtt / Fentanyl gtt. RASS goal: -5 until off paralytics. Hold WUA until off paralytics. Assess EEG. May need neuro consult.  PULMONARY A: Acute hypoxic respiratory failure - in the setting of cardiac arrest. P:   Full vent support. Wean as able. VAP prevention measures. Hold SBT until off paralytics. CXR in AM.  RENAL A:   AoCKD. Anticipate multiple metabolic derangements during cooling. Hyperkalemia-resolved P:   Correct electrolytes as indicated. Trend BMP's  GASTROINTESTINAL A:   GI prophylaxis. Nutrition. P:  SUP: Pantoprazole. NPO.  HEMATOLOGIC A:   VTE Prophylaxis. P:  SCD's / Heparin gtt Trend CBC's and Coags  INFECTIOUS A:   No indication of infection. P:   Monitor clinically Trend WBC's  ENDOCRINE A:   Hypothyroidism. Anticipate hyperglycemia during  cooling. P:   Continue outpatient synthroid Assess TSH ICU hyperglycemia protocol   Family updated: No family at bedside will discuss plan of care once family arrives  Marda Stalker, [place] time 35 min   STAFF NOTE: I, Merrie Roof, MD FACP have personally reviewed patient's available data, including medical history, events of note, physical examination and test results as part of my evaluation. I have discussed with resident/NP and other care providers such as pharmacist, RN and RRT. In addition, I personally evaluated patient and elicited key findings of: paralyzed and sedated prior, VT arrest in a 65 yr old, never cathed in past, concern is still ischemia with trop low vs primary arrythmia, consider cath now, lungs coarse on examination, abg reviewed, keep same MV, will need repeat abg at rewarm, hep, asa, levophed to MAP goal 80-85, echo needed, no sbt, now on levophed, consider transition to versed, and off prop, brady noted, if brady becomes symptomatic would transition to normo, no feeds, consider dopmaine at beta affect if brady remains issue The patient is critically ill with multiple organ systems failure and requires high complexity decision making for assessment and support, frequent evaluation and titration of therapies, application of advanced monitoring technologies and extensive interpretation of multiple databases.   Critical Care Time devoted to patient care services described in this note is 35 Minutes. This time reflects time of care of this signee: Merrie Roof, MD FACP. This critical care time does not reflect procedure time, or teaching time or supervisory time of PA/NP/Med student/Med Resident etc but could involve care discussion time. Rest per NP/medical resident whose note is outlined above and that I agree with   Lavon Paganini. Titus Mould, MD, [place] Pgr: [facility] 02/01/2016 11:56 AM

## 2016-02-01 NOTE — Progress Notes (Signed)
eLink Physician-Brief Progress Note Patient Name: Eric [name] DOB: 01-06-1951 MRN: [id number]   Date of Service  02/01/2016  HPI/Events of Note  RN calls as bedside K = 6.3. Also asking about whether to start amiodarone drip  eICU Interventions  Lab K was hemolyzed, TLC being placed now. Will send lab for K. If K > 5.5, will give lasix, kayexalate, D50, insulin and rpt K at 7am.  Will call Dr. Eula Fried re: amiodarone. Per ED, he wanted amiodarone but will verify.       Intervention Category Major Interventions: Electrolyte abnormality - evaluation and management  [place] 02/01/2016, 2:05 AM

## 2016-02-01 NOTE — Progress Notes (Signed)
eLink Physician-Brief Progress Note Patient Name: Torao Casper DOB: 06-18-1951 MRN: [id number]   Date of Service  02/01/2016  HPI/Events of Note  RN asking about amiodarone drip. She was concerned re: starting drip as pt had bradycardia in the 60s with BP OK. Told her its OK to start amiodarone drip as pt likely went into VFib or VT arrest. Discussed with Dr. Eula Fried as well.   eICU Interventions  Discussed again with Dr. Eula Fried. Dr. Eula Fried will discuss with Jody the nurse re: amiodarone drip.         [place] 02/01/2016, 2:17 AM

## 2016-02-01 NOTE — Progress Notes (Signed)
   02/01/16 0055  Clinical Encounter Type  Visited With Family;Health care provider  Visit Type Initial;ED;Critical Care  Referral From Nurse  Spiritual Encounters  Spiritual Needs Prayer;Emotional  Stress Factors  Patient Stress Factors None identified  Family Stress Factors Health changes   Chaplain responded to request to accompany family to [facility] waiting area from ED.  Chaplain offered empathetic listening and hospitality.  Prayer and emotional support were also offered to this family.  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  2 close family friends are present as well.  Pt child lives in [place] and is reportedly in route to [facility].  Chaplain is available if further support is desired.  [employer], Chaplain

## 2016-02-02 ENCOUNTER — Other Ambulatory Visit: Payer: Self-pay | Admitting: Interventional Cardiology

## 2016-02-02 ENCOUNTER — Inpatient Hospital Stay (HOSPITAL_COMMUNITY): Payer: BC Managed Care – PPO

## 2016-02-02 DIAGNOSIS — I469 Cardiac arrest, cause unspecified: Secondary | ICD-10-CM | POA: Diagnosis not present

## 2016-02-02 DIAGNOSIS — I447 Left bundle-branch block, unspecified: Secondary | ICD-10-CM | POA: Diagnosis not present

## 2016-02-02 DIAGNOSIS — I5022 Chronic systolic (congestive) heart failure: Secondary | ICD-10-CM | POA: Diagnosis not present

## 2016-02-02 LAB — COMPREHENSIVE METABOLIC PANEL
ALK PHOS: 45 U/L (ref 38–126)
ALT: 69 U/L — ABNORMAL HIGH (ref 17–63)
ANION GAP: 8 (ref 5–15)
AST: 75 U/L — ABNORMAL HIGH (ref 15–41)
Albumin: 3.5 g/dL (ref 3.5–5.0)
BUN: 22 mg/dL — ABNORMAL HIGH (ref 6–20)
CALCIUM: 9 mg/dL (ref 8.9–10.3)
CO2: 23 mmol/L (ref 22–32)
CREATININE: 2 mg/dL — AB (ref 0.61–1.24)
Chloride: 114 mmol/L — ABNORMAL HIGH (ref 101–111)
GFR, EST AFRICAN AMERICAN: 39 mL/min — AB (ref >60)
GFR, EST NON AFRICAN AMERICAN: 34 mL/min — AB (ref >60)
Glucose, Bld: 98 mg/dL (ref 65–99)
Potassium: 4.5 mmol/L (ref 3.5–5.1)
SODIUM: 145 mmol/L (ref 135–145)
TOTAL PROTEIN: 7.3 g/dL (ref 6.5–8.1)
Total Bilirubin: 0.9 mg/dL (ref 0.3–1.2)

## 2016-02-02 LAB — CBC
HEMATOCRIT: 39.8 % (ref 39.0–52.0)
HEMOGLOBIN: 13.4 g/dL (ref 13.0–17.0)
MCH: 31 pg (ref 26.0–34.0)
MCHC: 33.7 g/dL (ref 30.0–36.0)
MCV: 92.1 fL (ref 78.0–100.0)
Platelets: 228 10*3/uL (ref 150–400)
RBC: 4.32 MIL/uL (ref 4.22–5.81)
RDW: 14.2 % (ref 11.5–15.5)
WBC: 9.3 10*3/uL (ref 4.0–10.5)

## 2016-02-02 LAB — BASIC METABOLIC PANEL
ANION GAP: 11 (ref 5–15)
BUN: 20 mg/dL (ref 6–20)
CALCIUM: 9.2 mg/dL (ref 8.9–10.3)
CHLORIDE: 114 mmol/L — AB (ref 101–111)
CO2: 20 mmol/L — AB (ref 22–32)
Creatinine, Ser: 1.66 mg/dL — ABNORMAL HIGH (ref 0.61–1.24)
GFR calc non Af Amer: 42 mL/min — ABNORMAL LOW (ref >60)
GFR, EST AFRICAN AMERICAN: 49 mL/min — AB (ref >60)
Glucose, Bld: 105 mg/dL — ABNORMAL HIGH (ref 65–99)
Potassium: 3.8 mmol/L (ref 3.5–5.1)
Sodium: 145 mmol/L (ref 135–145)

## 2016-02-02 LAB — HEPARIN LEVEL (UNFRACTIONATED)
HEPARIN UNFRACTIONATED: 1.6 [IU]/mL — AB (ref 0.30–0.70)
HEPARIN UNFRACTIONATED: 1.24 [IU]/mL — AB (ref 0.30–0.70)

## 2016-02-02 LAB — URINE CULTURE: CULTURE: NO GROWTH

## 2016-02-02 LAB — POCT I-STAT, CHEM 8
BUN: 24 mg/dL — ABNORMAL HIGH (ref 6–20)
Calcium, Ion: 1.21 mmol/L (ref 1.13–1.30)
Chloride: 112 mmol/L — ABNORMAL HIGH (ref 101–111)
Creatinine, Ser: 2 mg/dL — ABNORMAL HIGH (ref 0.61–1.24)
GLUCOSE: 95 mg/dL (ref 65–99)
HEMATOCRIT: 44 % (ref 39.0–52.0)
HEMOGLOBIN: 15 g/dL (ref 13.0–17.0)
POTASSIUM: 4.2 mmol/L (ref 3.5–5.1)
Sodium: 146 mmol/L — ABNORMAL HIGH (ref 135–145)
TCO2: 21 mmol/L (ref 0–100)

## 2016-02-02 LAB — APTT
APTT: 74 s — AB (ref 24–37)
aPTT: 77 seconds — ABNORMAL HIGH (ref 24–37)
aPTT: 69 seconds — ABNORMAL HIGH (ref 24–37)

## 2016-02-02 LAB — GLUCOSE, CAPILLARY
GLUCOSE-CAPILLARY: 83 mg/dL (ref 65–99)
GLUCOSE-CAPILLARY: 84 mg/dL (ref 65–99)
Glucose-Capillary: 80 mg/dL (ref 65–99)
Glucose-Capillary: 93 mg/dL (ref 65–99)
Glucose-Capillary: 95 mg/dL (ref 65–99)
Glucose-Capillary: 97 mg/dL (ref 65–99)

## 2016-02-02 LAB — MAGNESIUM: Magnesium: 2.1 mg/dL (ref 1.7–2.4)

## 2016-02-02 LAB — PHOSPHORUS: Phosphorus: 3 mg/dL (ref 2.5–4.6)

## 2016-02-02 MED ORDER — ACETAMINOPHEN 160 MG/5ML PO SOLN
ORAL | Status: AC
  Filled 2016-02-02: qty 20.3

## 2016-02-02 MED ORDER — HEPARIN (PORCINE) IN NACL 100-0.45 UNIT/ML-% IJ SOLN
900.0000 [IU]/h | INTRAMUSCULAR | Status: DC
  Administered 2016-02-03 (×2): 900 [IU]/h via INTRAVENOUS
  Filled 2016-02-02 (×2): qty 250

## 2016-02-02 MED ORDER — ACETAMINOPHEN 325 MG PO TABS
ORAL_TABLET | ORAL | Status: AC
  Filled 2016-02-02: qty 2

## 2016-02-02 MED ORDER — SODIUM CHLORIDE 0.9% FLUSH: 10.0000 mL | INTRAVENOUS | Status: DC | PRN

## 2016-02-02 MED ORDER — ACETAMINOPHEN 325 MG PO TABS
650.0000 mg | ORAL_TABLET | ORAL | Status: DC | PRN
Start: 2016-02-02 — End: 2016-02-17
  Administered 2016-02-02 – 2016-02-15 (×10): 650 mg via ORAL
  Filled 2016-02-02 (×9): qty 2

## 2016-02-02 MED ORDER — ACETAMINOPHEN 325 MG PO TABS
325.0000 mg | ORAL_TABLET | Freq: Once | ORAL | Status: AC
  Administered 2016-02-02: 325 mg via ORAL

## 2016-02-02 MED ORDER — SODIUM CHLORIDE 0.9 % IV SOLN
INTRAVENOUS | Status: DC
  Administered 2016-02-06: 250 mL via INTRAVENOUS
  Administered 2016-02-11 (×2): via INTRAVENOUS

## 2016-02-02 MED ORDER — SODIUM CHLORIDE 0.9% FLUSH
10.0000 mL | Freq: Two times a day (BID) | INTRAVENOUS | Status: DC
  Administered 2016-02-02: 30 mL
  Administered 2016-02-02 – 2016-02-08 (×10): 10 mL
  Administered 2016-02-08: 30 mL
  Administered 2016-02-09: 20 mL

## 2016-02-02 NOTE — Progress Notes (Addendum)
ANTICOAGULATION CONSULT NOTE - Initial Consult  Pharmacy Consult for heparin Indication: atrial fibrillation and on hypothermia protocol s/p cardiac arrest  Allergies  Allergen Reactions  . Lactose Intolerance (Gi) Diarrhea    Patient Measurements: Height: 5\' 9"  (175.3 cm) Weight: 245 lb 8.1 oz (111.36 kg) IBW/kg (Calculated) : 70.7 Heparin Dosing Weight: 95kg  Vital Signs: Temp: 96.6 F (35.9 C) (05/10 0640) Temp Source: Core (Comment) (05/10 0640) BP: 110/62 mmHg (05/10 0722) Pulse Rate: 69 (05/10 0722)  Labs:  Recent Labs  01/31/16 2206  02/01/16 0105  02/01/16 0450 02/01/16 [id number] 02/01/16 0915 02/01/16 0916 02/01/16 1115 02/01/16 1215 02/01/16 1759 02/01/16 2249 02/02/16 0200 02/02/16 0350 02/02/16 0736  HGB 13.2  --   --   < > 11.6*  --   --   --   --   --   --  14.6  --  13.4 15.0  HCT 39.0  --   --   < > 34.1*  --   --   --   --   --   --  43.0  --  39.8 44.0  PLT 267  --   --   --  213  --   --   --   --   --   --   --   --  228  --   APTT  --   < > 25  --   --  42* 65*  --   --   --  85*  --   --  77*  --   LABPROT 15.7*  --  16.2*  --   --  18.2*  --   --  18.2*  --   --   --   --   --   --   INR 1.23  --  1.29  --   --  1.51*  --   --  1.50*  --   --   --   --   --   --   HEPARINUNFRC  --   --   --   --   --  0.97*  --  1.09*  --   --   --   --   --  1.60*  --   CREATININE 2.17*  --   --   < > 1.64* 1.71*  --   --   --  1.65* 1.59* 1.50* 1.66* 2.00* 2.00*  TROPONINI <0.03  --   --   < > 0.11* 0.11*  --   --   --  0.08* 0.06*  --   --   --   --   < > = values in this interval not displayed.  Estimated Creatinine Clearance: 45.9 mL/min (by C-G formula based on Cr of 2).   Medical History: Past Medical History  Diagnosis Date  . Claustrophobia   . Heart murmur   . Hypertension   . Varicose vein of leg     right  . Dysrhythmia     "palpitations"  . Migraine 02/13/12    "opthalmic"  . Chronic lower back pain   . Exertional dyspnea 01/2012  . CHF  (congestive heart failure) ([place])   . Hypothyroidism   . Chronic kidney disease     kidney fx studies increased      Assessment: 65yo male presents to hospital s/p cardiac arrest w/ shockable rhythm in the field, started hypothermia protocol (at goal temp at 0520 this AM), on heparin for Afib;  Eliquis PTA though timing of last dose unclear.   HL this AM has increased 1.09 > 1.6, while APTT has decreased 85 > 77. Pt is now being rewarmed. No reported bleeding.  Goal of Therapy:  Heparin level 0.3-0.7 units/ml aPTT 66-102 seconds Monitor platelets by anticoagulation protocol: Yes   Plan:  Hold heparin gtt for 1 hour HL in 1 hour Monitor for s/sx bleeding  Joya San, PharmD Clinical Pharmacy Resident Pager # [phone] 02/02/2016 7:48 AM    ADDENDUM  HL continued to increase up to 1.6 this AM - which was concerning as we'd expect HL to decrease as Eliquis is cleared from system. S/p holding heparin x1 hour and drew HL. HL returned at 1.24, making [place] believe the Eliquis is still affecting the HL. Therefore, restarted HL and will continue dosing using APTT.  Plan: Restart heparin at 900 units/hr Follow up 6 hour APTT Daily HL/APTT/CBC Monitor for s/sx bleeding  Joya San, PharmD Clinical Pharmacy Resident Pager # [phone] 02/02/2016 9:58 AM

## 2016-02-02 NOTE — Clinical Documentation Improvement (Signed)
Critical Care  Would you please clarify type of encephalopathy  Metabolic  Toxic  hypertensive  Other  Clinically Undetermined  Supporting Information: Already documented with encephalopathy   Please exercise your independent, professional judgment when responding. A specific answer is not anticipated or expected.   Thank You, [place] [phone]

## 2016-02-02 NOTE — Progress Notes (Signed)
eLink Physician-Brief Progress Note Patient Name: Eric [name] DOB: 03-04-1951 MRN: [id number]   Date of Service  02/02/2016  HPI/Events of Note  Fever; agitation  eICU Interventions  Tylenol prn. Check lfts.   Cont fentanyl. On versed drip > may need to inc versed drip         [place] 02/02/2016, 3:15 AM

## 2016-02-02 NOTE — Progress Notes (Signed)
Initial Nutrition Assessment  DOCUMENTATION CODES:   Morbid obesity  INTERVENTION:  -If nutrition support within [place], recommend initiation of Vital High Protein at goal rate of 60 ml/hr (1440 ml/d) with 30 ml prostat daily to provide 1540 kcals and 141 g of protein (meets >/= 95% needs).    NUTRITION DIAGNOSIS:   Inadequate oral intake related to inability to eat as evidenced by NPO status.  GOAL:   Provide needs based on ASPEN/SCCM guidelines  MONITOR:   Vent status, Labs, Weight trends, Skin, I & O's  REASON FOR ASSESSMENT:   Ventilator    ASSESSMENT:   Pt with known sCHF and A.fib, presented 05/08 after out of hospital cardiac arrest. ROSC within 15-20 minutes after defib x 2. Hypothermia protocol. Cooling completed.  5/9 intubated  Temp (24hrs), Avg:95.7 F (35.4 C), Min:91 F (32.8 C), Max:100 F (37.8 C) Pt weight stable.  Spoke to RN who does not expect pt to be extubated today.   NFPE: no muscle depletion, no fat depletion, no edema.  Labs reviewed; Na 146, Cl 112, BUN 24, creat 2.0, CBGs 83-172. Meds reviewed.   Diet Order:   NPO  Skin:  Reviewed, no issues  Last BM:  unknown  Height:   Ht Readings from Last 1 Encounters:  02/01/16 5\' 9"  (1.753 m)    Weight:   Wt Readings from Last 1 Encounters:  02/02/16 245 lb 8.1 oz (111.36 kg)    Ideal Body Weight:  72.7 kg  BMI:  Body mass index is 36.24 kg/(m^2).  Estimated Nutritional Needs:   Kcal:  [id number]  Protein:  145  Fluid:  Per MD  EDUCATION NEEDS:   No education needs identified at this time  Geoffery Lyons, [place] Dietetic Intern Pager [phone]

## 2016-02-02 NOTE — Progress Notes (Addendum)
ANTICOAGULATION CONSULT NOTE - Initial Consult  Pharmacy Consult for heparin Indication: atrial fibrillation and on hypothermia protocol s/p cardiac arrest  Allergies  Allergen Reactions  . Lactose Intolerance (Gi) Diarrhea    Patient Measurements: Height: 5\' 9"  (175.3 cm) Weight: 245 lb 8.1 oz (111.36 kg) IBW/kg (Calculated) : 70.7 Heparin Dosing Weight: 95kg  Vital Signs: Temp: 99.1 F (37.3 C) (05/10 1600) Temp Source: Core (Comment) (05/10 1600) BP: 125/67 mmHg (05/10 1516) Pulse Rate: 74 (05/10 1516)  Labs:  Recent Labs  01/31/16 2206  02/01/16 0105  02/01/16 0450  02/01/16 0653  02/01/16 0916 02/01/16 1115 02/01/16 1215 02/01/16 1759 02/01/16 2249 02/02/16 0200 02/02/16 0350 02/02/16 0736 02/02/16 0830 02/02/16 1600  HGB 13.2  --   --   < > 11.6*  --   --   --   --   --   --   --  14.6  --  13.4 15.0  --   --   HCT 39.0  --   --   < > 34.1*  --   --   --   --   --   --   --  43.0  --  39.8 44.0  --   --   PLT 267  --   --   --  213  --   --   --   --   --   --   --   --   --  228  --   --   --   APTT  --   < > 25  --   --   --  42*  < >  --   --   --  85*  --   --  77*  --   --  69*  LABPROT 15.7*  --  16.2*  --   --   --  18.2*  --   --  18.2*  --   --   --   --   --   --   --   --   INR 1.23  --  1.29  --   --   --  1.51*  --   --  1.50*  --   --   --   --   --   --   --   --   HEPARINUNFRC  --   --   --   --   --   < > 0.97*  --  1.09*  --   --   --   --   --  1.60*  --  1.24*  --   CREATININE 2.17*  --   --   < > 1.64*  --  1.71*  --   --   --  1.65* 1.59* 1.50* 1.66* 2.00* 2.00*  --   --   TROPONINI <0.03  --   --   < > 0.11*  --  0.11*  --   --   --  0.08* 0.06*  --   --   --   --   --   --   < > = values in this interval not displayed.  Estimated Creatinine Clearance: 45.9 mL/min (by C-G formula based on Cr of 2).   Medical History: Past Medical History  Diagnosis Date  . Claustrophobia   . Heart murmur   . Hypertension   . Varicose vein of  leg     right  . Dysrhythmia     "  palpitations"  . Migraine 02/13/12    "opthalmic"  . Chronic lower back pain   . Exertional dyspnea 01/2012  . CHF (congestive heart failure) ([place])   . Hypothyroidism   . Chronic kidney disease     kidney fx studies increased      Assessment: 65yo male presents to hospital s/p cardiac arrest w/ shockable rhythm in the field, started hypothermia protocol (at goal temp at 0520 this AM), on heparin for Afib; Eliquis PTA though timing of last dose unclear.   HL continued to increase up to 1.6 this AM - which was concerning as we'd expect HL to decrease as Eliquis is cleared from system. S/p holding heparin x1 hour and drew HL. HL returned at 1.24, making [place] believe the Eliquis is still affecting the HL. Therefore, restarted HL and will continue dosing using APTT.  Initial aPTT is therapeutic at 69 sec on heparin 900 units/hr. No issues with infusion or bleeding noted.  Goal of Therapy:  Heparin level 0.3-0.7 units/ml aPTT 66-102 seconds Monitor platelets by anticoagulation protocol: Yes   Plan:  Continue heparin 900 units/hr Daily HL/APTT/CBC Monitor for s/sx bleeding  Andrey Cota. Diona Foley, PharmD, BCPS Clinical Pharmacist Pager [phone]  02/02/2016 5:13 PM   ADDN: Confirmatory aPTT remains therapeutic at 74 sec on heparin 900 units/hr. No issues with infusion or bleeding noted. Continue current regimen.  Andrey Cota. Diona Foley, PharmD, [place] Clinical Pharmacist Pager [phone]

## 2016-02-02 NOTE — Progress Notes (Signed)
PULMONARY / CRITICAL CARE MEDICINE   Name: Eric [name] MRN: [id number] DOB: 1951/03/04    ADMISSION DATE:  01/31/2016 CONSULTATION DATE:  01/31/16  REFERRING MD:  EDP  CHIEF COMPLAINT:  Cardiac Arrest  HISTORY OF PRESENT ILLNESS:  Pt is encephelopathic; therefore, this HPI is obtained from chart review. Eric [name] is a 65 y.o. male with PMH as outlined below including known systolic heart failure and A.fib on Eliquis.  He was in his USOH until evening of 05/08 when he was with his wife at [place].  As they were leaving [employer], he began driving the car and while still in the parking lot, he began to breath very heavily before becoming unresponsive.  His wife immediately got the car stopped, got him out of the car and began CPR while calling EMS.  She feels that EMS arrived within 5 - 10 minutes and upon their arrival, he received 2 defibrillations via AED.  ROSC was achieved within roughly 15 - 20 minutes.  Upon ED arrival, he remained unresponsive and was subsequently intubated.  PCCM was called for admission and consideration of hypothermia protocol.  Cardiology was also called in consultation.  Per his wife, he had not had any complaints recently and had behaved completely normal.  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   SUBJECTIVE:  On vent opens eyes with vigorous stimulation currently sedated with versed and fentanyl.  Levophed drip infusing to maintain map 80-85 until hypothermia protocol completed.  Spoke with RN initiated rewarming phase.  Spoke with RN she stated pt did wake up during the night with periods of agitation versed drip dose increased.   VITAL SIGNS: BP 110/62 mmHg  Pulse 69  Temp(Src) 96.6 F (35.9 C) (Core (Comment))  Resp 23  Ht 5\' 9"  (1.753 m)  Wt 245 lb 8.1 oz (111.36 kg)  BMI 36.24 kg/m2  SpO2 99%  HEMODYNAMICS: CVP:  [4 mmHg-12 mmHg] 5 mmHg  VENTILATOR SETTINGS: Vent Mode:  [-] PRVC FiO2  (%):  [40 %] 40 % Set Rate:  [18 bmp] 18 bmp Vt Set:  [600 mL] 600 mL PEEP:  [5 cmH20] 5 cmH20 Plateau Pressure:  [19 cmH20-32 cmH20] 27 cmH20  INTAKE / OUTPUT: I/O last 3 completed shifts: In: 5217.1 [I.V.:4727.1; NG/GT:80; IV Piggyback:410] Out: 6125 [Urine:6125]   PHYSICAL EXAMINATION: General: Adult male, in NAD. Neuro: Sedated, opens eyes with vigorous stimulation  HEENT: [place]/AT. Pupils pinpoint and sluggish 64mm, sclerae anicteric. Cardiovascular: NSR occasional PVC's, s1s2, no M/R/G.  Lungs: Respirations even and unlabored.  CTA bilaterally, No W/R/R.  Abdomen: BS x 4, soft, NT/ND.  Musculoskeletal: No gross deformities, no edema.  Skin: Intact, warm, no rashes.  LABS:  BMET  Recent Labs Lab 02/01/16 1759  02/02/16 0200 02/02/16 0350 02/02/16 0736  NA 142  < > 145 145 146*  K 3.6  < > 3.8 4.5 4.2  CL 115*  < > 114* 114* 112*  CO2 17*  --  20* 23  --   BUN 23*  < > 20 22* 24*  CREATININE 1.59*  < > 1.66* 2.00* 2.00*  GLUCOSE 175*  < > 105* 98 95  < > = values in this interval not displayed.  Electrolytes  Recent Labs Lab 02/01/16 0450  02/01/16 1759 02/02/16 0200 02/02/16 0350  CALCIUM 8.6*  < > 8.8* 9.2 9.0  MG 1.8  --   --  2.1  --   PHOS 1.6*  --   --  3.0  --   < > = values in this interval not displayed.  CBC  Recent Labs Lab 01/31/16 2206  02/01/16 0450 02/01/16 2249 02/02/16 0350 02/02/16 0736  WBC 8.0  --  7.9  --  9.3  --   HGB 13.2  < > 11.6* 14.6 13.4 15.0  HCT 39.0  < > 34.1* 43.0 39.8 44.0  PLT 267  --  213  --  228  --   < > = values in this interval not displayed.  Coag's  Recent Labs Lab 02/01/16 0105 02/01/16 0653 02/01/16 0915 02/01/16 1115 02/01/16 1759 02/02/16 0350  APTT 25 42* 65*  --  85* 77*  INR 1.29 1.51*  --  1.50*  --   --     Sepsis Markers  Recent Labs Lab 02/01/16 0200 02/01/16 0450  LATICACIDVEN 1.2 1.4    ABG  Recent Labs Lab 01/31/16 2308  PHART 7.343*  PCO2ART 43.2  PO2ART 286.0*     Liver Enzymes  Recent Labs Lab 01/31/16 2206 02/02/16 0350  AST 140* 75*  ALT 84* 69*  ALKPHOS 51 45  BILITOT 1.2 0.9  ALBUMIN 3.6 3.5    Cardiac Enzymes  Recent Labs Lab 02/01/16 0653 02/01/16 1215 02/01/16 1759  TROPONINI 0.11* 0.08* 0.06*    Glucose  Recent Labs Lab 02/01/16 1417 02/01/16 1612 02/01/16 1750 02/01/16 2016 02/02/16 0008 02/02/16 0402  GLUCAP 159* 176* 172* 126* 95 97    Imaging Dg Chest Port 1 View  02/02/2016  CLINICAL DATA:  Cardiac arrest, intubated patient, acute respiratory failure EXAM: PORTABLE CHEST 1 VIEW COMPARISON:  Portable chest x-ray of Feb 01, 2016 FINDINGS: The lungs are adequately inflated. Subsegmental atelectasis at the left lung base is present. The cardiac silhouette is enlarged. The pulmonary vascularity is less engorged today. External pacemaker defibrillator pads remain present. There is no pleural effusion or pneumothorax. The endotracheal tube tip lies 6 cm above the carina. The esophagogastric tube tip projects below the inferior margin of the image. The left internal jugular venous catheter tip projects over the midportion of the SVC. IMPRESSION: Improved appearance of the thorax with decreasing pulmonary interstitial edema. Minimal left lower lobe subsegmental atelectasis is present. The support tubes are in stable position. Electronically Signed   By: David  Martinique M.D.   On: 02/02/2016 07:22     STUDIES:  CXR 05/08 >>no acute process. CT head 05/08 >>no acute process Echo 5/9>>EF 30 to 35% EEG 5/9>>no focal, hemispheric, or lateralizing features; demonstrated a burst/suppression pattern, which can be consistent with a sedated EEG   CULTURES: Blood 05/08 >> Urine 05/08 >> Sputum 05/08 >> MRSA PCR 5/9>>Negative  ANTIBIOTICS: None  SIGNIFICANT EVENTS: 05/08 - admitted after cardiac arrest.  Started on TTM - 33 degrees C.   LINES/TUBES: ETT 05/08 > R radial a line 05/08 > Right IJ CVL 05/08  >  DISCUSSION: 65 y.o. M with known sCHF and A.fib, presented 05/08 after out of hospital cardiac arrest.  ROSC within 15-20 minutes after defib x 2.  Started on hypothermia protocol, 33 degrees celsius  ASSESSMENT / PLAN:  CARDIOVASCULAR A:  Cardiac arrest - VF got shocked, r/o ischemia Aortic aneurysm Hx HTN, A.fib on Eliquis, sCHF (EF 40-45% per echo from April 2017) P:  Continue rewarming phase with goal temp 37 degrees Celsius  Goal MAP > 80 until hypothermia protocol completed once protocol completed consider changing map goal to >65 Levophed PRN for above goal Trend troponin, lactate Heparin gtt  in lieu of outpatient eliquis Cards following, appreciate the assistance 4.9 cm aortic aneurysm per cards will need CT of chest once extubated and Cr improved  Per Cards will require R/L cath once awake and neurologically intact Continue outpatient atorvastatin Hold outpatient carvedilol, eliquis, furosemide, bidil, spironolactone  NEUROLOGIC A:   Acute encephalopathy - concern for anoxia given exam findings. Hx migraines, chronic lower back pain. P:   Sedation:  Versed/Fentanyl gtt RASS goal: -5 until hypothermia protocol completed once protocol completed will change RAS goal -1 Consider repeat EEG once sedation is off per Neuro recommendation  WUA daily  Neuro consult appreciate input  PULMONARY A: Acute hypoxic respiratory failure - in the setting of cardiac arrest. P:   Full vent support Wean as able VAP prevention measures SBT's once hypothermia protocol completed CXR in AM  RENAL A:   AoCKD. Anticipate multiple metabolic derangements during cooling. Hyperkalemia-resolved P:   Correct electrolytes as indicated. Trend BMP's  GASTROINTESTINAL A:   GI prophylaxis. Nutrition. P:  SUP: Pantoprazole. NPO.  HEMATOLOGIC A:   VTE Prophylaxis. P:  SCD's / Heparin gtt Trend CBC's and Coags  INFECTIOUS A:   No indication of infection. P:   Monitor  clinically Trend WBC's and monitor fever curve  ENDOCRINE A:   Hypothyroidism. P:   Continue outpatient synthroid Assess TSH Continue SS insulin ICU hyperglycemia protocol   Family updated: No family at bedside will discuss plan of care once family arrives  Marda Stalker, [place]

## 2016-02-02 NOTE — Progress Notes (Signed)
 Patient Name: Eric [name] Date of Encounter: 02/02/2016  Primary Cardiologist: Dr. Smith   Patient profile: 65 yo male with h/o of NICM, afib on eliquis, LBBB presented with cardiac arrest while talking to his wife in the car. CPR started 1-2 min later. Upon EMS arrival, he received AED shock x 2 and achieved ROSC.    Principal Problem:   Cardiac arrest (HCC) Active Problems:   Chronic systolic heart failure (HCC)   Hyperlipidemia   Left bundle branch block   Obesity (BMI 30-39.9)   Atrial flutter (HCC)   Acute respiratory failure (HCC)   Encounter for central line placement   Arrhythmia   Altered mental status    SUBJECTIVE  Intubated and sedated  CURRENT MEDS . antiseptic oral rinse  7 mL Mouth Rinse 10 times per day  . artificial tears  1 application Both Eyes Q8H  . atorvastatin  20 mg Per Tube q1800  . chlorhexidine gluconate (SAGE KIT)  15 mL Mouth Rinse BID  . insulin aspart  2-6 Units Subcutaneous Q4H  . levothyroxine  37.5 mcg Intravenous Daily  . midazolam  2 mg Intravenous Once  . pantoprazole (PROTONIX) IV  40 mg Intravenous QHS    OBJECTIVE  Filed Vitals:   02/02/16 0500 02/02/16 0600 02/02/16 0640 02/02/16 0722  BP: 119/57 101/57  110/62  Pulse: 67 70  69  Temp: 97.3 F (36.3 C) 96.4 F (35.8 C) 96.6 F (35.9 C)   TempSrc: Core (Comment) Core (Comment) Core (Comment)   Resp: 23 19  23  Height:      Weight: 245 lb 8.1 oz (111.36 kg)     SpO2: 100% 100%  99%    Intake/Output Summary (Last 24 hours) at 02/02/16 0903 Last data filed at 02/02/16 0643  Gross per 24 hour  Intake 1874.54 ml  Output   5445 ml  Net -3570.46 ml   Filed Weights   01/31/16 2205 02/01/16 0105 02/02/16 0500  Weight: 242 lb 1 oz (109.8 kg) 247 lb 5.7 oz (112.2 kg) 245 lb 8.1 oz (111.36 kg)    PHYSICAL EXAM  General: Intubated, moving, not alert or oriented. Neuro: unable to assess HEENT:  Normal. Intubated and sedated  Neck: supple Lungs:  On ventilator.  Intubated. Anterior exam CTA.  Heart: RRR no s3, s4, or murmurs. Difficult to hear aortic murmur Abdomen: Soft, non-tender, non-distended, BS + x 4.  Extremities: No clubbing, cyanosis or edema.  Accessory Clinical Findings  CBC  Recent Labs  01/31/16 2206  02/01/16 0450  02/02/16 0350 02/02/16 0736  WBC 8.0  --  7.9  --  9.3  --   NEUTROABS 4.7  --   --   --   --   --   HGB 13.2  < > 11.6*  < > 13.4 15.0  HCT 39.0  < > 34.1*  < > 39.8 44.0  MCV 94.2  --  92.2  --  92.1  --   PLT 267  --  213  --  228  --   < > = values in this interval not displayed. Basic Metabolic Panel  Recent Labs  02/01/16 0450  02/02/16 0200 02/02/16 0350 02/02/16 0736  NA 139  < > 145 145 146*  K 3.6  < > 3.8 4.5 4.2  CL 108  < > 114* 114* 112*  CO2 16*  < > 20* 23  --   GLUCOSE 171*  < > 105* 98 95  BUN   28*  < > 20 22* 24*  CREATININE 1.64*  < > 1.66* 2.00* 2.00*  CALCIUM 8.6*  < > 9.2 9.0  --   MG 1.8  --  2.1  --   --   PHOS 1.6*  --  3.0  --   --   < > = values in this interval not displayed. Liver Function Tests  Recent Labs  01/31/16 2206 02/02/16 0350  AST 140* 75*  ALT 84* 69*  ALKPHOS 51 45  BILITOT 1.2 0.9  PROT 7.4 7.3  ALBUMIN 3.6 3.5   Cardiac Enzymes  Recent Labs  02/01/16 0653 02/01/16 1215 02/01/16 1759  TROPONINI 0.11* 0.08* 0.06*   Fasting Lipid Panel  Recent Labs  02/01/16 0105  TRIG 82   Thyroid Function Tests  Recent Labs  02/01/16 0105  TSH 2.890    TELE LBBB with frequently missed beat, HR improved this morning.     ECG  No new EKG  Echocardiogram  LV EF: 30% - 35%  ------------------------------------------------------------------- Indications: Cardiac arrest 427.5.  ------------------------------------------------------------------- History: PMH: Atrial fibrillation. Atrial flutter. Congestive heart failure.  ------------------------------------------------------------------- Study Conclusions  - Left  ventricle: The cavity size was severely dilated. There was  mild focal basal hypertrophy of the septum. Systolic function was  moderately to severely reduced. The estimated ejection fraction  was in the range of 30% to 35%. Diffuse hypokinesis. Features are  consistent with a pseudonormal left ventricular filling pattern,  with concomitant abnormal relaxation and increased filling  pressure (grade 2 diastolic dysfunction). Doppler parameters are  consistent with elevated ventricular end-diastolic filling  pressure. - Ventricular septum: Septal motion showed abnormal function and  dyssynergy. - Aortic valve: There was severe regurgitation. - Aortic root: The aortic root was dilated measurinh 49 mm. - Ascending aorta: The ascending aorta was dilated measuring 43 mm. - Left atrium: The atrium was moderately dilated. - Right ventricle: The cavity size was normal. Wall thickness was  normal. Systolic function was moderately reduced. - Right atrium: The atrium was normal in size. - Tricuspid valve: There was mild regurgitation. - Pulmonic valve: There was moderate regurgitation. - Pulmonary arteries: Systolic pressure was within the normal  range. PA peak pressure: 32 mm Hg (S). - Inferior vena cava: The vessel was normal in size. - Pericardium, extracardiac: There was no pericardial effusion.  There was a left pleural effusion.  Impressions:  - Dilated aortc root, with bicuspid aortic valve and severe aortic  regurgitation with eccentric jet. Mildly dilated ascending aorta.    Radiology/Studies  Ct Head Wo Contrast  02/01/2016  CLINICAL DATA:  Cardiac arrest EXAM: CT HEAD WITHOUT CONTRAST TECHNIQUE: Contiguous axial images were obtained from the base of the skull through the vertex without intravenous contrast. COMPARISON:  None. FINDINGS: There is no intracranial hemorrhage, mass or evidence of acute infarction. There is no extra-axial fluid collection. Gray matter and  white matter appear normal. Cerebral volume is normal for age. Brainstem and posterior fossa are unremarkable. The CSF spaces appear normal. The bony structures are intact. The visible portions of the paranasal sinuses are clear. The orbits are unremarkable. IMPRESSION: Normal brain Electronically Signed   By: Daniel R Mitchell M.D.   On: 02/01/2016 00:49   Dg Chest Port 1 View  02/02/2016  CLINICAL DATA:  Cardiac arrest, intubated patient, acute respiratory failure EXAM: PORTABLE CHEST 1 VIEW COMPARISON:  Portable chest x-ray of Feb 01, 2016 FINDINGS: The lungs are adequately inflated. Subsegmental atelectasis at the left lung base   is present. The cardiac silhouette is enlarged. The pulmonary vascularity is less engorged today. External pacemaker defibrillator pads remain present. There is no pleural effusion or pneumothorax. The endotracheal tube tip lies 6 cm above the carina. The esophagogastric tube tip projects below the inferior margin of the image. The left internal jugular venous catheter tip projects over the midportion of the SVC. IMPRESSION: Improved appearance of the thorax with decreasing pulmonary interstitial edema. Minimal left lower lobe subsegmental atelectasis is present. The support tubes are in stable position. Electronically Signed   By: David  Jordan M.D.   On: 02/02/2016 07:22   Dg Chest Port 1 View  02/01/2016  CLINICAL DATA:  Central line placement EXAM: PORTABLE CHEST 1 VIEW COMPARISON:  01/31/2016 FINDINGS: The endotracheal tube tip is 3.2 cm above the carina. There is a new left jugular central line which extends to the expected location of the SVC 1.5 cm below the azygos vein junction. The nasogastric tube extends into the stomach and beyond the inferior edge of the image. A percutaneous pacing patch superimposes the left hemi thorax. Airspace opacities are present in the left central lung. This could represent pneumonia, hemorrhage, aspiration. Right lung is clear. No  pneumothorax. IMPRESSION: Support equipment appears satisfactorily positioned. Central left lung airspace opacity, appears worsened. Electronically Signed   By: Daniel R Mitchell M.D.   On: 02/01/2016 02:35   Dg Chest Port 1 View  01/31/2016  CLINICAL DATA:  Respiratory distress for 1 day. EXAM: PORTABLE CHEST 1 VIEW COMPARISON:  10/11/2012 FINDINGS: The endotracheal tube is 4.5 cm above the carina. No consolidation. No large effusions. No pneumothorax. IMPRESSION: Satisfactorily positioned ETT. Electronically Signed   By: Daniel R Mitchell M.D.   On: 01/31/2016 22:25    ASSESSMENT AND PLAN  1. Cardiac arrest s/p 2 AED shock:   - unclear etiology, AED indicated shockable rhythm but he has history of flutter/fib and LBBB   - given cardiac arrest, drop in EF, and severe AR and bicuspid AV on echo and sudden onset of SOB prior to cardiac arrest, will need ischemic workup likely left and right heart cath once neurological condition improve  - he also has markedly prolonged QTc, therefore cannot r/o arrhythmia. Potassium low. IV amiodarone stopped given QT prolongation and 2nd degree AV block and bradycardia.  - currently undergoing rewarming after being cooled for < 24 hours. Will need to reassess his mental status afterward, hopefully renal function will improve to a degree where Left and Right heart cath is an option  2. PAF on eliquis  3. Chronic LBBB  4. Aortic aneurysm: 4.9 cm CXR does not support acute dissection but should have CT after extubated and Cr improved  5. NICM/Chronic systolic HF: EF was 25-30% on echo 03/22/2012, EF 40-45% on echo 01/19/2016  - Echo 02/01/2016 EF 30-35%, diffuse hypokinesis, grade 2 DD, severe AR, dilated aortic root 49mm, mild TR, moderate PR, PA peak pressure 32mmHg, no pericardial effusion  6. HTN 7. Severe AR 8. Acute on CKD stage III, baseline Cr appears to be 1.5, trended up to 2  Signed, Meng, Hao PA-C Pager: [phone]  Agree with note by Hao Meng  PA-C  Day # 1 SCD, CPR, shock. [place]. Prior H/O NISCM. Getting rewarmed. Rhythm stable. Off Amio. On low dose Levophed. EF 30% with ? Bicuspid AoV and severe AI with Ao root dilatation. Still being sedated. Good UOP. SCr increased 1.6---> 2.0. Will ultimately  require R/L heart cath once awake and neurologically intact.     Jonathan J. Berry, M.D., FACP, FACC, FAHA, FSCAI [facility] [address] [place], [place]  [address]  [phone] 02/02/2016 10:06 AM  

## 2016-02-02 NOTE — Progress Notes (Signed)
Patient temperature is not being managed at 36 degrees Celsius at this time. Notified Elink MD. Order Tylenol PRN for patient. 650 mg of Tylenol given per tube. Within 45 mins, patient temperature has not come down. Notified Elink MD again, 325mg  of Tylenol given at this time and ice packs applied to patient.  Will continue to monitor patient. Gildardo Cranker, RN

## 2016-02-03 ENCOUNTER — Encounter (HOSPITAL_COMMUNITY): Payer: Self-pay

## 2016-02-03 ENCOUNTER — Inpatient Hospital Stay (HOSPITAL_COMMUNITY): Payer: BC Managed Care – PPO

## 2016-02-03 DIAGNOSIS — G9340 Encephalopathy, unspecified: Secondary | ICD-10-CM

## 2016-02-03 DIAGNOSIS — I4892 Unspecified atrial flutter: Secondary | ICD-10-CM

## 2016-02-03 DIAGNOSIS — E785 Hyperlipidemia, unspecified: Secondary | ICD-10-CM | POA: Diagnosis not present

## 2016-02-03 DIAGNOSIS — I469 Cardiac arrest, cause unspecified: Secondary | ICD-10-CM

## 2016-02-03 LAB — GLUCOSE, CAPILLARY
GLUCOSE-CAPILLARY: 84 mg/dL (ref 65–99)
Glucose-Capillary: 75 mg/dL (ref 65–99)
Glucose-Capillary: 80 mg/dL (ref 65–99)
Glucose-Capillary: 120 mg/dL — ABNORMAL HIGH (ref 65–99)
Glucose-Capillary: 57 mg/dL — ABNORMAL LOW (ref 65–99)
Glucose-Capillary: 80 mg/dL (ref 65–99)
Glucose-Capillary: 98 mg/dL (ref 65–99)
Glucose-Capillary: 139 mg/dL — ABNORMAL HIGH (ref 65–99)

## 2016-02-03 LAB — POCT I-STAT 3, ART BLOOD GAS (G3+)
Acid-base deficit: 7 mmol/L — ABNORMAL HIGH (ref 0.0–2.0)
Bicarbonate: 18.9 mEq/L — ABNORMAL LOW (ref 20.0–24.0)
O2 Saturation: 96 %
Patient temperature: 98.6
TCO2: 20 mmol/L (ref 0–100)
pCO2 arterial: 37.3 mmHg (ref 35.0–45.0)
pH, Arterial: 7.312 — ABNORMAL LOW (ref 7.350–7.450)
pO2, Arterial: 91 mmHg (ref 80.0–100.0)

## 2016-02-03 LAB — CBC
HEMATOCRIT: 39.1 % (ref 39.0–52.0)
Hemoglobin: 12.7 g/dL — ABNORMAL LOW (ref 13.0–17.0)
MCH: 31.6 pg (ref 26.0–34.0)
MCHC: 32.5 g/dL (ref 30.0–36.0)
MCV: 97.3 fL (ref 78.0–100.0)
Platelets: 212 10*3/uL (ref 150–400)
RBC: 4.02 MIL/uL — ABNORMAL LOW (ref 4.22–5.81)
RDW: 15 % (ref 11.5–15.5)
WBC: 13 10*3/uL — ABNORMAL HIGH (ref 4.0–10.5)

## 2016-02-03 LAB — PROCALCITONIN: PROCALCITONIN: 1.52 ng/mL

## 2016-02-03 LAB — APTT: aPTT: 85 seconds — ABNORMAL HIGH (ref 24–37)

## 2016-02-03 LAB — HEPARIN LEVEL (UNFRACTIONATED): Heparin Unfractionated: 0.98 IU/mL — ABNORMAL HIGH (ref 0.30–0.70)

## 2016-02-03 LAB — BASIC METABOLIC PANEL
Anion gap: 13 (ref 5–15)
BUN: 27 mg/dL — ABNORMAL HIGH (ref 6–20)
CHLORIDE: 113 mmol/L — AB (ref 101–111)
CO2: 17 mmol/L — ABNORMAL LOW (ref 22–32)
CREATININE: 2.38 mg/dL — AB (ref 0.61–1.24)
Calcium: 8.6 mg/dL — ABNORMAL LOW (ref 8.9–10.3)
GFR calc non Af Amer: 27 mL/min — ABNORMAL LOW (ref >60)
GFR, EST AFRICAN AMERICAN: 31 mL/min — AB (ref >60)
GLUCOSE: 82 mg/dL (ref 65–99)
Potassium: 4.8 mmol/L (ref 3.5–5.1)
Sodium: 143 mmol/L (ref 135–145)

## 2016-02-03 LAB — MAGNESIUM: Magnesium: 2.2 mg/dL (ref 1.7–2.4)

## 2016-02-03 MED ORDER — VITAL HIGH PROTEIN PO LIQD
1000.0000 mL | ORAL | Status: DC
  Administered 2016-02-04 – 2016-02-05 (×2): 1000 mL

## 2016-02-03 MED ORDER — PRO-STAT SUGAR FREE PO LIQD
30.0000 mL | Freq: Every day | ORAL | Status: DC
  Administered 2016-02-03 – 2016-02-05 (×3): 30 mL via ORAL
  Filled 2016-02-03 (×3): qty 30

## 2016-02-03 MED ORDER — PIPERACILLIN-TAZOBACTAM 3.375 G IVPB
3.3750 g | Freq: Three times a day (TID) | INTRAVENOUS | Status: DC
  Administered 2016-02-03 – 2016-02-08 (×14): 3.375 g via INTRAVENOUS
  Filled 2016-02-03 (×16): qty 50

## 2016-02-03 MED ORDER — DEXTROSE 50 % IV SOLN
1.0000 | Freq: Once | INTRAVENOUS | Status: AC
  Administered 2016-02-03: 50 mL via INTRAVENOUS

## 2016-02-03 MED ORDER — VANCOMYCIN HCL 10 G IV SOLR
2000.0000 mg | Freq: Once | INTRAVENOUS | Status: AC
  Administered 2016-02-03: 2000 mg via INTRAVENOUS
  Filled 2016-02-03: qty 2000

## 2016-02-03 MED ORDER — VITAL HIGH PROTEIN PO LIQD
1000.0000 mL | ORAL | Status: DC
  Administered 2016-02-03: 1000 mL

## 2016-02-03 MED ORDER — PIPERACILLIN-TAZOBACTAM 3.375 G IVPB 30 MIN
3.3750 g | Freq: Once | INTRAVENOUS | Status: AC
Start: 2016-02-03 — End: 2016-02-03
  Administered 2016-02-03: 3.375 g via INTRAVENOUS
  Filled 2016-02-03: qty 50

## 2016-02-03 MED ORDER — SODIUM CHLORIDE 0.9 % IV SOLN
1250.0000 mg | INTRAVENOUS | Status: DC
  Administered 2016-02-04 – 2016-02-05 (×2): 1250 mg via INTRAVENOUS
  Filled 2016-02-03 (×3): qty 1250

## 2016-02-03 MED ORDER — HYDRALAZINE HCL 20 MG/ML IJ SOLN
10.0000 mg | Freq: Three times a day (TID) | INTRAMUSCULAR | Status: DC | PRN
  Administered 2016-02-03 – 2016-02-06 (×3): 10 mg via INTRAVENOUS
  Filled 2016-02-03 (×3): qty 1

## 2016-02-03 MED ORDER — DEXTROSE 50 % IV SOLN
INTRAVENOUS | Status: AC
  Filled 2016-02-03: qty 50

## 2016-02-03 NOTE — Progress Notes (Signed)
Hypoglycemic Event  CBG: 57  Treatment: D50 IV 50 mL  Symptoms: None  Follow-up CBG: [id number] CBG Result:120  Possible Reasons for Event: Inadequate meal intake  Comments/MD notified:CCM aware, NP Hinton Dyer    Eric [name] K

## 2016-02-03 NOTE — Procedures (Signed)
HPI:  65 y/o S/P CP arrest with encephalopathy  TECHNICAL SUMMARY:  A multichannel referential and bipolar montage EEG using the standard international 10-20 system was performed.  There is no occipital dominant rhythm.  5 to 6 hertz activity can be seen throughout the central and posterior head regions.  Slower 2 Hz activity is noted throughout the frontal head regions.  Low voltage fast (beta) activity is distributed symmetrically and maximally over the anterior head regions.  Portions of the tracing are contaminated with much movement and myogenic artifact.  ACTIVATION:  Stepwise photic stimulation and hyperventilation are not performed.  EPILEPTIFORM ACTIVITY:  There were no spikes, sharp waves or paroxysmal activity.  SLEEP:  No sleep is noted.   IMPRESSION:  This is an abnormal EEG demonstrating a moderate diffuse slowing of electrocerebral activity.  This can be seen in a wide variety of encephalopathic state including those of a toxic, metabolic, or degenerative nature.  There were no focal, hemispheric, or lateralizing features.  No epileptiform activity was recorded.

## 2016-02-03 NOTE — Progress Notes (Signed)
Pharmacist Heart Failure Core Measure Documentation  Assessment: Eric [name] has an EF documented as 30-35% on 02/01/16 by Echo.  Rationale: Heart failure patients with left ventricular systolic dysfunction (LVSD) and an EF < 40% should be prescribed an angiotensin converting enzyme inhibitor (ACEI) or angiotensin receptor blocker (ARB) at discharge unless a contraindication is documented in the medical record.  This patient is not currently on an ACEI or ARB for HF.  This note is being placed in the record in order to provide documentation that a contraindication to the use of these agents is present for this encounter.  ACE Inhibitor or Angiotensin Receptor Blocker is contraindicated (specify all that apply)  []   ACEI allergy AND ARB allergy []   Angioedema []   Moderate or severe aortic stenosis []   Hyperkalemia []   Hypotension []   Renal artery stenosis [x]   Worsening renal function, preexisting renal disease or dysfunction  Hildred Laser, Pharm D 02/03/2016 9:07 AM

## 2016-02-03 NOTE — Progress Notes (Signed)
Patient Name: Eric [name] Date of Encounter: 02/03/2016  Primary Cardiologist: Dr. Tamala Julian   Patient profile: 65 yo male with h/o of NICM, afib on eliquis, LBBB presented with cardiac arrest while talking to his wife in the car. CPR started 1-2 min later. Upon EMS arrival, he received AED shock x 2 and achieved ROSC.    Principal Problem:   Cardiac arrest [facility]) Active Problems:   Chronic systolic heart failure (HCC)   Hyperlipidemia   Left bundle branch block   Obesity (BMI 30-39.9)   Atrial flutter ([place])   Acute respiratory failure ([place])   Encounter for central line placement   Arrhythmia   Altered mental status    SUBJECTIVE  Intubated. Warmed. Versed stopped this morning. Patient staring into the distance, however able to follow command by move hands  CURRENT MEDS . antiseptic oral rinse  7 mL Mouth Rinse 10 times per day  . atorvastatin  20 mg Per Tube q1800  . chlorhexidine gluconate (SAGE KIT)  15 mL Mouth Rinse BID  . [COMPLETED] dextrose      . insulin aspart  2-6 Units Subcutaneous Q4H  . levothyroxine  37.5 mcg Intravenous Daily  . midazolam  2 mg Intravenous Once  . pantoprazole (PROTONIX) IV  40 mg Intravenous QHS  . sodium chloride flush  10-40 mL Intracatheter Q12H    OBJECTIVE  Filed Vitals:   02/03/16 0500 02/03/16 0600 02/03/16 0700 02/03/16 0725  BP:    106/57  Pulse: 78 77 77 81  Temp: 98.4 F (36.9 C) 98.6 F (37 C) 98.6 F (37 C)   TempSrc: Core (Comment) Core (Comment) Core (Comment)   Resp: _0 Height:      Weight:      SpO2: 98% 99% 97% 100%    Intake/Output Summary (Last 24 hours) at 02/03/16 0753 Last data filed at 02/03/16 0700  Gross per 24 hour  Intake 1414.86 ml  Output    905 ml  Net 509.86 ml   Filed Weights   02/01/16 0105 02/02/16 0500 02/03/16 0400  Weight: 247 lb 5.7 oz (112.2 kg) 245 lb 8.1 oz (111.36 kg) 246 lb 15 oz (112.01 kg)    PHYSICAL EXAM  General: Intubated, moving, mainly staring into the  distance, but can follow command if needed. Neuro: unable to assess HEENT:  Normal. Intubated. Sedation weaning off  Neck: supple Lungs:  On ventilator. Intubated. Anterior exam CTA.  Heart: RRR no s3, s4, or murmurs. Difficult to hear aortic murmur Abdomen: Soft, non-distended, BS + x 4.  Extremities: No clubbing, cyanosis or edema.  Accessory Clinical Findings  CBC  Recent Labs  01/31/16 2206  02/02/16 0350 02/02/16 0736 02/03/16 0340  WBC 8.0  < > 9.3  --  13.0*  NEUTROABS 4.7  --   --   --   --   HGB 13.2  < > 13.4 15.0 12.7*  HCT 39.0  < > 39.8 44.0 39.1  MCV 94.2  < > 92.1  --  97.3  PLT 267  < > 228  --  212  < > = values in this interval not displayed. Basic Metabolic Panel  Recent Labs  02/01/16 0450  02/02/16 0200 02/02/16 0350 02/02/16 0736 02/03/16 0340  NA 139  < > 145 145 146* 143  K 3.6  < > 3.8 4.5 4.2 4.8  CL 108  < > 114* 114* 112* 113*  CO2 16*  < > 20* 23  --  17*  GLUCOSE 171*  < > 105* 98 95 82  BUN 28*  < > 20 22* 24* 27*  CREATININE 1.64*  < > 1.66* 2.00* 2.00* 2.38*  CALCIUM 8.6*  < > 9.2 9.0  --  8.6*  MG 1.8  --  2.1  --   --  2.2  PHOS 1.6*  --  3.0  --   --   --   < > = values in this interval not displayed. Liver Function Tests  Recent Labs  01/31/16 2206 02/02/16 0350  AST 140* 75*  ALT 84* 69*  ALKPHOS 51 45  BILITOT 1.2 0.9  PROT 7.4 7.3  ALBUMIN 3.6 3.5   Cardiac Enzymes  Recent Labs  02/01/16 0653 02/01/16 1215 02/01/16 1759  TROPONINI 0.11* 0.08* 0.06*   Fasting Lipid Panel  Recent Labs  02/01/16 0105  TRIG 82   Thyroid Function Tests  Recent Labs  02/01/16 0105  TSH 2.890    TELE LBBB with frequently missed beat, HR improved this morning.     ECG  No new EKG  Echocardiogram 02/01/2016  LV EF: 30% - 35%  ------------------------------------------------------------------- Indications: Cardiac arrest  427.5.  ------------------------------------------------------------------- History: PMH: Atrial fibrillation. Atrial flutter. Congestive heart failure.  ------------------------------------------------------------------- Study Conclusions  - Left ventricle: The cavity size was severely dilated. There was  mild focal basal hypertrophy of the septum. Systolic function was  moderately to severely reduced. The estimated ejection fraction  was in the range of 30% to 35%. Diffuse hypokinesis. Features are  consistent with a pseudonormal left ventricular filling pattern,  with concomitant abnormal relaxation and increased filling  pressure (grade 2 diastolic dysfunction). Doppler parameters are  consistent with elevated ventricular end-diastolic filling  pressure. - Ventricular septum: Septal motion showed abnormal function and  dyssynergy. - Aortic valve: There was severe regurgitation. - Aortic root: The aortic root was dilated measurinh 49 mm. - Ascending aorta: The ascending aorta was dilated measuring 43 mm. - Left atrium: The atrium was moderately dilated. - Right ventricle: The cavity size was normal. Wall thickness was  normal. Systolic function was moderately reduced. - Right atrium: The atrium was normal in size. - Tricuspid valve: There was mild regurgitation. - Pulmonic valve: There was moderate regurgitation. - Pulmonary arteries: Systolic pressure was within the normal  range. PA peak pressure: 32 mm Hg (S). - Inferior vena cava: The vessel was normal in size. - Pericardium, extracardiac: There was no pericardial effusion.  There was a left pleural effusion.  Impressions:  - Dilated aortc root, with bicuspid aortic valve and severe aortic  regurgitation with eccentric jet. Mildly dilated ascending aorta.    Radiology/Studies  Ct Head Wo Contrast  02/01/2016  CLINICAL DATA:  Cardiac arrest EXAM: CT HEAD WITHOUT CONTRAST TECHNIQUE: Contiguous  axial images were obtained from the base of the skull through the vertex without intravenous contrast. COMPARISON:  None. FINDINGS: There is no intracranial hemorrhage, mass or evidence of acute infarction. There is no extra-axial fluid collection. Gray matter and white matter appear normal. Cerebral volume is normal for age. Brainstem and posterior fossa are unremarkable. The CSF spaces appear normal. The bony structures are intact. The visible portions of the paranasal sinuses are clear. The orbits are unremarkable. IMPRESSION: Normal brain Electronically Signed   By: Andreas Newport M.D.   On: 02/01/2016 00:49   Dg Chest Port 1 View  02/03/2016  CLINICAL DATA:  Acute respiratory failure EXAM: PORTABLE CHEST 1 VIEW COMPARISON:  02/02/2016 FINDINGS: Cardiac  shadow remains enlarged. Left jugular catheter, nasogastric catheter and endotracheal tube are again seen and stable. Increased density in the left retrocardiac region is noted consistent with increasing lower lobe atelectasis/consolidation. Mild right basilar atelectasis is noted as well. No bony abnormality is seen. IMPRESSION: Increasing left basilar consolidation. New right basilar atelectasis. Electronically Signed   By: Inez Catalina M.D.   On: 02/03/2016 07:06   Dg Chest Port 1 View  02/02/2016  CLINICAL DATA:  Cardiac arrest, intubated patient, acute respiratory failure EXAM: PORTABLE CHEST 1 VIEW COMPARISON:  Portable chest x-ray of Feb 01, 2016 FINDINGS: The lungs are adequately inflated. Subsegmental atelectasis at the left lung base is present. The cardiac silhouette is enlarged. The pulmonary vascularity is less engorged today. External pacemaker defibrillator pads remain present. There is no pleural effusion or pneumothorax. The endotracheal tube tip lies 6 cm above the carina. The esophagogastric tube tip projects below the inferior margin of the image. The left internal jugular venous catheter tip projects over the midportion of the SVC.  IMPRESSION: Improved appearance of the thorax with decreasing pulmonary interstitial edema. Minimal left lower lobe subsegmental atelectasis is present. The support tubes are in stable position. Electronically Signed   By: David  Martinique M.D.   On: 02/02/2016 07:22   Dg Chest Port 1 View  02/01/2016  CLINICAL DATA:  Central line placement EXAM: PORTABLE CHEST 1 VIEW COMPARISON:  01/31/2016 FINDINGS: The endotracheal tube tip is 3.2 cm above the carina. There is a new left jugular central line which extends to the expected location of the SVC 1.5 cm below the azygos vein junction. The nasogastric tube extends into the stomach and beyond the inferior edge of the image. A percutaneous pacing patch superimposes the left hemi thorax. Airspace opacities are present in the left central lung. This could represent pneumonia, hemorrhage, aspiration. Right lung is clear. No pneumothorax. IMPRESSION: Support equipment appears satisfactorily positioned. Central left lung airspace opacity, appears worsened. Electronically Signed   By: Andreas Newport M.D.   On: 02/01/2016 02:35   Dg Chest Port 1 View  01/31/2016  CLINICAL DATA:  Respiratory distress for 1 day. EXAM: PORTABLE CHEST 1 VIEW COMPARISON:  10/11/2012 FINDINGS: The endotracheal tube is 4.5 cm above the carina. No consolidation. No large effusions. No pneumothorax. IMPRESSION: Satisfactorily positioned ETT. Electronically Signed   By: Andreas Newport M.D.   On: 01/31/2016 22:25    ASSESSMENT AND PLAN  1. Cardiac arrest s/p 2 AED shock:   - unclear etiology, AED indicated shockable rhythm but he has history of flutter/fib and LBBB   - given cardiac arrest, drop in EF, and severe AR and bicuspid AV on echo and sudden onset of SOB prior to cardiac arrest, will need ischemic workup likely left and right heart cath once neurological condition improve  - he also has markedly prolonged QTc, therefore cannot r/o arrhythmia. Potassium low. IV amiodarone stopped  given QT prolongation and 2nd degree AV block and bradycardia.  - rewarmed after being cooled for < 24 hours. Episodic fever, increasing opacity in L base, need to monitor for PNA. Assess mental status and renal function, plan for L&RHC once renal function improve. If no significant underlying CAD and no other explainable cause for cardiac arrest, will need to think about ICD.  2. PAF on eliquis  - CHA2DS2-Vasc score 2 (HTN, HF)  3. Chronic LBBB  4. Aortic aneurysm: 4.9 cm CXR does not support acute dissection but should have CT after extubated and Cr improved  5. NICM/Chronic systolic HF: EF was 91-36% on echo 03/22/2012, EF 40-45% on echo 01/19/2016  - Echo 02/01/2016 EF 30-35%, diffuse hypokinesis, grade 2 DD, severe AR, dilated aortic root 2m, mild TR, moderate PR, PA peak pressure 312mg, no pericardial effusion  6. HTN: BP improving after stopping sedation, will hold off restart coreg at this time given frequent dropped beats and bradycardia on telemetry  7. Severe AR: worsened on echo 02/01/2016 compare to previous echo in 01/19/2016, unclear cause for worsening after only 2 weeks  8. Acute on CKD stage III, baseline Cr appears to be 1.5, trended up to 2.3  - renal function continue to get worse.   SiHilbert CorriganA-C Pager: [phone] seen and examined  I agree with findings as noted above by H Meng   Pt is rewarmed Still intubated, sedated. ON exam:  Lungs clear anteriorly   Cardiac Irreg Irreg No diastolic murmur Ext withut edema  Further cardiac work up (cath, probable limited echo) once neuro recovers   Cr is trending up  Off pressors  Will need to follow.    PaDorris Carnes

## 2016-02-03 NOTE — Progress Notes (Signed)
Nutrition Follow-up  DOCUMENTATION CODES:   Morbid obesity  INTERVENTION:  Initiate Vital High Protein at goal rate of 60 ml/hr (1440 ml/d) with 30 ml prostat daily to provide 1540 kcals and 141 g of protein (meets >/= 95% needs).    NUTRITION DIAGNOSIS:   Inadequate oral intake related to inability to eat as evidenced by NPO status.  Ongoing  GOAL:   Provide needs based on ASPEN/SCCM guidelines  Unmet   MONITOR:   Vent status, Labs, Weight trends, TF tolerance, Skin, I & O's  REASON FOR ASSESSMENT:   Consult Enteral/tube feeding initiation and management  ASSESSMENT:   Pt with known sCHF and A.fib, presented 05/08 after out of hospital cardiac arrest. ROSC within 15-20 minutes after defib x 2. Hypothermia protocol. Cooling completed.  5/9 intubated  Temp (24hrs), Avg:98.9 F (37.2 C), Min:97.2 F (36.2 C), Max:100.9 F (38.3 C)  Labs reviewed; Cl 113, BUN 27, creat 2.38, GFR 31, CBGs 57-120. Meds reviewed.   Diet Order:   NPO  Skin:  Reviewed, no issues  Last BM:  unknown  Height:   Ht Readings from Last 1 Encounters:  02/01/16 5\' 9"  (1.753 m)    Weight:   Wt Readings from Last 1 Encounters:  02/03/16 246 lb 15 oz (112.01 kg)    Ideal Body Weight:  72.7 kg  BMI:  Body mass index is 36.45 kg/(m^2).  Estimated Nutritional Needs:   Kcal:  [id number]  Protein:  145  Fluid:  Per MD  EDUCATION NEEDS:   No education needs identified at this time  Geoffery Lyons, [place] Dietetic Intern Pager [phone]

## 2016-02-03 NOTE — Progress Notes (Signed)
EEG completed; results pending.    

## 2016-02-03 NOTE — Progress Notes (Signed)
PULMONARY / CRITICAL CARE MEDICINE   Name: Alan Berkenpas MRN: [id number] DOB: [birth year]    ADMISSION DATE:  01/31/2016 CONSULTATION DATE:  01/31/16  REFERRING MD:  EDP  CHIEF COMPLAINT:  Cardiac Arrest  HISTORY OF PRESENT ILLNESS:  Pt is encephelopathic; therefore, this HPI is obtained from chart review. Jerrel Ketcherside is a 65 y.o. male with PMH as outlined below including known systolic heart failure and A.fib on Eliquis.  He was in his USOH until evening of 05/08 when he was with his wife at [place].  As they were leaving [employer], he began driving the car and while still in the parking lot, he began to breath very heavily before becoming unresponsive.  His wife immediately got the car stopped, got him out of the car and began CPR while calling EMS.  She feels that EMS arrived within 5 - 10 minutes and upon their arrival, he received 2 defibrillations via AED.  ROSC was achieved within roughly 15 - 20 minutes.  Upon ED arrival, he remained unresponsive and was subsequently intubated.  PCCM was called for admission and consideration of hypothermia protocol.  Cardiology was also called in consultation.  Per his wife, he had not had any complaints recently and had behaved completely normal.  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   SUBJECTIVE:  Pt intubated sedated on Fentanyl following simple commands stares to the distance with upper left gaze when not stimulated. According to RN pt had episodic fevers during the night  VITAL SIGNS: BP 106/57 mmHg  Pulse 80  Temp(Src) 98.6 F (37 C) (Core (Comment))  Resp 24  Ht 5\' 9"  (1.753 m)  Wt 246 lb 15 oz (112.01 kg)  BMI 36.45 kg/m2  SpO2 100%  HEMODYNAMICS: CVP:  [7 mmHg-18 mmHg] 14 mmHg  VENTILATOR SETTINGS: Vent Mode:  [-] PRVC FiO2 (%):  [40 %] 40 % Set Rate:  [18 bmp] 18 bmp Vt Set:  [600 mL] 600 mL PEEP:  [5 cmH20] 5 cmH20 Plateau Pressure:  [21 [id number] cmH20] 21  cmH20  INTAKE / OUTPUT: I/O last 3 completed shifts: In: 2188 [I.V.:1928; NG/GT:260] Out: 4425 [Urine:4425]   PHYSICAL EXAMINATION: General: Adult male, in NAD. Neuro: sedated follows simple commands  HEENT: [place]/AT, pupils 2 mm reactive, sclerae anicteric. Cardiovascular: NSR, s1s2, no M/R/G.  Lungs: Respirations even and unlabored.  CTA bilaterally, No W/R/R.  Abdomen: BS x 4, soft, NT/ND.  Musculoskeletal: No gross deformities, no edema, moves all extremities  Skin: Intact, warm, no rashes.  LABS:  BMET  Recent Labs Lab 02/02/16 0200 02/02/16 0350 02/02/16 0736 02/03/16 0340  NA 145 145 146* 143  K 3.8 4.5 4.2 4.8  CL 114* 114* 112* 113*  CO2 20* 23  --  17*  BUN 20 22* 24* 27*  CREATININE 1.66* 2.00* 2.00* 2.38*  GLUCOSE 105* 98 95 82    Electrolytes  Recent Labs Lab 02/01/16 0450  02/02/16 0200 02/02/16 0350 02/03/16 0340  CALCIUM 8.6*  < > 9.2 9.0 8.6*  MG 1.8  --  2.1  --  2.2  PHOS 1.6*  --  3.0  --   --   < > = values in this interval not displayed.  CBC  Recent Labs Lab 02/01/16 0450  02/02/16 0350 02/02/16 0736 02/03/16 0340  WBC 7.9  --  9.3  --  13.0*  HGB 11.6*  < > 13.4 15.0 12.7*  HCT 34.1*  < > 39.8 44.0  39.1  PLT 213  --  228  --  212  < > = values in this interval not displayed.  Coag's  Recent Labs Lab 02/01/16 0105 02/01/16 0653  02/01/16 1115  02/02/16 1600 02/02/16 2149 02/03/16 0340  APTT 25 42*  < >  --   < > 69* 74* 85*  INR 1.29 1.51*  --  1.50*  --   --   --   --   < > = values in this interval not displayed.  Sepsis Markers  Recent Labs Lab 02/01/16 0200 02/01/16 0450  LATICACIDVEN 1.2 1.4    ABG  Recent Labs Lab 01/31/16 2308  PHART 7.343*  PCO2ART 43.2  PO2ART 286.0*    Liver Enzymes  Recent Labs Lab 01/31/16 2206 02/02/16 0350  AST 140* 75*  ALT 84* 69*  ALKPHOS 51 45  BILITOT 1.2 0.9  ALBUMIN 3.6 3.5    Cardiac Enzymes  Recent Labs Lab 02/01/16 0653 02/01/16 1215  02/01/16 1759  TROPONINI 0.11* 0.08* 0.06*    Glucose  Recent Labs Lab 02/02/16 1625 02/02/16 2026 02/03/16 0004 02/03/16 0328 02/03/16 0744 02/03/16 0800  GLUCAP 80 84 75 80 57* 120*    Imaging Dg Chest Port 1 View  02/03/2016  CLINICAL DATA:  Acute respiratory failure EXAM: PORTABLE CHEST 1 VIEW COMPARISON:  02/02/2016 FINDINGS: Cardiac shadow remains enlarged. Left jugular catheter, nasogastric catheter and endotracheal tube are again seen and stable. Increased density in the left retrocardiac region is noted consistent with increasing lower lobe atelectasis/consolidation. Mild right basilar atelectasis is noted as well. No bony abnormality is seen. IMPRESSION: Increasing left basilar consolidation. New right basilar atelectasis. Electronically Signed   By: Inez Catalina M.D.   On: 02/03/2016 07:06     STUDIES:  CXR 05/08 >>no acute process. CT head 05/08 >>no acute process Echo 5/9>>EF 30 to 35%, severe aortic regurgitation EEG 5/9>>no focal, hemispheric, or lateralizing features; demonstrated a burst/suppression pattern, which can be consistent with a sedated EEG  EEG 5/11>>  CULTURES: Blood 05/08 >>no growth 5/10>> Urine 05/08 >>no growth 5/10 Sputum 05/08 >> MRSA PCR 5/9>>Negative  ANTIBIOTICS: None  SIGNIFICANT EVENTS: 05/08 - admitted after cardiac arrest.  Started on TTM - 33 degrees C. 5/10-pt rewarmed per hypothermia protocol  LINES/TUBES: ETT 05/08 > R radial a line 05/08 > Right IJ CVL 05/08 >  DISCUSSION: 65 y.o. M with known sCHF and A.fib, presented 05/08 after out of hospital cardiac arrest.  ROSC within 15-20 minutes after defib x 2.  Started on hypothermia protocol, 33 degrees celsius 5/8 rewarmed 5/10  ASSESSMENT / PLAN:  CARDIOVASCULAR A:  Cardiac arrest - VF got shocked, r/o ischemia Aortic aneurysm Hx HTN, A.fib on Eliquis, sCHF (EF 40-45% per echo from April 2017) P:  Goal MAP > 65 Levophed PRN for above goal Heparin gtt in lieu of  outpatient eliquis Cards following, appreciate the assistance 4.9 cm aortic aneurysm per cards will need CT of chest once extubated and Cr improved  Per Cards will require R/L cath once awake and neurologically intact Continue outpatient atorvastatin Hold outpatient carvedilol, eliquis, furosemide, bidil, spironolactone  NEUROLOGIC A:   Acute encephalopathy - concern for anoxia given exam findings Hx migraines, chronic lower back pain. P:   Sedation:  Fentanyl gtt RASS goal -1 Repeat EEG today WUA daily  ABG today Neuro consult appreciate input  PULMONARY A: Acute hypoxic respiratory failure - in the setting of cardiac arrest. P:   Full vent support-PRVC 18, 600,  5, 40% Wean as able VAP prevention measures CXR in AM  RENAL A:   AoCKD. Anticipate multiple metabolic derangements during cooling. Hyperkalemia-resolved P:   Correct electrolytes as indicated. Trend BMP's  GASTROINTESTINAL A:   GI prophylaxis. Nutrition. P:  SUP: Pantoprazole. Start tube feedings today  HEMATOLOGIC A:   VTE Prophylaxis. P:  SCD's / Heparin gtt Trend CBC's and Coags Monitor for s/sx of bleeding  INFECTIOUS A:   New mild right basilar atelectasis, increased left basilar consolidation  P:   Monitor for PNA may consider antibiotics WBC 13.0 Trend WBC's and monitor fever curve  ENDOCRINE A:  Hypoglycemia Hypothyroidism. P:   Continue outpatient synthroid Assess TSH Continue SS insulin ICU hyperglycemia/hypoglycemic protocol   Family updated: No family at bedside will discuss plan of care once family arrives  Marda Stalker, [place]

## 2016-02-03 NOTE — Progress Notes (Signed)
ANTICOAGULATION CONSULT NOTE - Initial Consult  Pharmacy Consult for heparin Indication: atrial fibrillation   Allergies  Allergen Reactions  . Lactose Intolerance (Gi) Diarrhea    Patient Measurements: Height: 5\' 9"  (175.3 cm) Weight: 246 lb 15 oz (112.01 kg) IBW/kg (Calculated) : 70.7 Heparin Dosing Weight: 95kg  Vital Signs: Temp: 98.6 F (37 C) (05/11 0700) Temp Source: Core (Comment) (05/11 0700) BP: 106/57 mmHg (05/11 0725) Pulse Rate: 81 (05/11 0725)  Labs:  Recent Labs  02/01/16 0105  02/01/16 0450 02/01/16 [id number]  02/01/16 1115 02/01/16 1215 02/01/16 1759  02/02/16 0350 02/02/16 0736 02/02/16 0830 02/02/16 1600 02/02/16 2149 02/03/16 0340  HGB  --   < > 11.6*  --   --   --   --   --   < > 13.4 15.0  --   --   --  12.7*  HCT  --   < > 34.1*  --   --   --   --   --   < > 39.8 44.0  --   --   --  39.1  PLT  --   --  213  --   --   --   --   --   --  228  --   --   --   --  212  APTT 25  --   --  42*  < >  --   --  85*  --  77*  --   --  69* 74* 85*  LABPROT 16.2*  --   --  18.2*  --  18.2*  --   --   --   --   --   --   --   --   --   INR 1.29  --   --  1.51*  --  1.50*  --   --   --   --   --   --   --   --   --   HEPARINUNFRC  --   --   --  0.97*  < >  --   --   --   --  1.60*  --  1.24*  --   --  0.98*  CREATININE  --   < > 1.64* 1.71*  --   --  1.65* 1.59*  < > 2.00* 2.00*  --   --   --  2.38*  TROPONINI  --   < > 0.11* 0.11*  --   --  0.08* 0.06*  --   --   --   --   --   --   --   < > = values in this interval not displayed.  Estimated Creatinine Clearance: 38.7 mL/min (by C-G formula based on Cr of 2.38).   Medical History: Past Medical History  Diagnosis Date  . Claustrophobia   . Heart murmur   . Hypertension   . Varicose vein of leg     right  . Dysrhythmia     "palpitations"  . Migraine 02/13/12    "opthalmic"  . Chronic lower back pain   . Exertional dyspnea 01/2012  . CHF (congestive heart failure) ([place])   . Hypothyroidism   .  Chronic kidney disease     kidney fx studies increased      Assessment: 65yo male presents to hospital s/p cardiac arrest w/ shockable rhythm in the field, s/p hypothermia protocol, on heparin for Afib; Eliquis PTA though timing of last  dose unclear.    HL continues to not correlate with APTT. APTT this AM remains therapeutic. No issues with infusion or bleeding noted.  Goal of Therapy:  Heparin level 0.3-0.7 units/ml aPTT 66-102 seconds Monitor platelets by anticoagulation protocol: Yes   Plan:  Continue heparin 900 units/hr Daily HL/APTT/CBC Monitor for s/sx bleeding  Joya San, PharmD Clinical Pharmacy Resident Pager # [phone] 02/03/2016 7:53 AM

## 2016-02-03 NOTE — Progress Notes (Signed)
ANTIBIOTIC CONSULT NOTE - INITIAL  Pharmacy Consult for zosyn/vancomycin Indication: PNA  Allergies  Allergen Reactions  . Lactose Intolerance (Gi) Diarrhea    Patient Measurements: Height: '5\' 9"'  (175.3 cm) Weight: 246 lb 15 oz (112.01 kg) IBW/kg (Calculated) : 70.7   Vital Signs: Temp: 98.6 F (37 C) (05/11 1500) Temp Source: Core (Comment) (05/11 1500) BP: 129/52 mmHg (05/11 1152) Pulse Rate: 89 (05/11 1500) Intake/Output from previous day: 05/10 0701 - 05/11 0700 In: 1414.9 [I.V.:1184.9; NG/GT:230] Out: 905 [Urine:905] Intake/Output from this shift: Total I/O In: 420.9 [I.V.:335.6; NG/GT:85.3] Out: 300 [Urine:300]  Labs:  Recent Labs  02/01/16 0450  02/02/16 0350 02/02/16 0736 02/03/16 0340  WBC 7.9  --  9.3  --  13.0*  HGB 11.6*  < > 13.4 15.0 12.7*  PLT 213  --  228  --  212  CREATININE 1.64*  < > 2.00* 2.00* 2.38*  < > = values in this interval not displayed. Estimated Creatinine Clearance: 38.7 mL/min (by C-G formula based on Cr of 2.38). No results for input(s): VANCOTROUGH, VANCOPEAK, VANCORANDOM, GENTTROUGH, GENTPEAK, GENTRANDOM, TOBRATROUGH, TOBRAPEAK, TOBRARND, AMIKACINPEAK, AMIKACINTROU, AMIKACIN in the last 72 hours.   Microbiology: Recent Results (from the past 720 hour(s))  Urine culture     Status: None   Collection Time: 01/31/16 10:36 PM  Result Value Ref Range Status   Specimen Description URINE, CATHETERIZED  Final   Special Requests NONE  Final   Culture NO GROWTH  Final   Report Status 02/02/2016 FINAL  Final  MRSA PCR Screening     Status: None   Collection Time: 02/01/16 12:51 AM  Result Value Ref Range Status   MRSA by PCR NEGATIVE NEGATIVE Final    Comment:        The GeneXpert MRSA Assay (FDA approved for NASAL specimens only), is one component of a comprehensive MRSA colonization surveillance program. It is not intended to diagnose MRSA infection nor to guide or monitor treatment for MRSA infections.   Culture, blood  (Routine X 2) w Reflex to ID Panel     Status: None (Preliminary result)   Collection Time: 02/01/16  1:14 AM  Result Value Ref Range Status   Specimen Description BLOOD RIGHT HAND  Final   Special Requests IN PEDIATRIC BOTTLE 2CC  Final   Culture NO GROWTH 1 DAY  Final   Report Status PENDING  Incomplete  Culture, blood (Routine X 2) w Reflex to ID Panel     Status: None (Preliminary result)   Collection Time: 02/01/16  1:14 AM  Result Value Ref Range Status   Specimen Description BLOOD RIGHT HAND  Final   Special Requests IN PEDIATRIC BOTTLE 1CC  Final   Culture NO GROWTH 1 DAY  Final   Report Status PENDING  Incomplete    Medical History: Past Medical History  Diagnosis Date  . Claustrophobia   . Heart murmur   . Hypertension   . Varicose vein of leg     right  . Dysrhythmia     "palpitations"  . Migraine 02/13/12    "opthalmic"  . Chronic lower back pain   . Exertional dyspnea 01/2012  . CHF (congestive heart failure) ([place])   . Hypothyroidism   . Chronic kidney disease     kidney fx studies increased     Medications:  Prescriptions prior to admission  Medication Sig Dispense Refill Last Dose  . atorvastatin (LIPITOR) 20 MG tablet TAKE 1 TABLET BY MOUTH DAILY 90 tablet 0 Taking  .  b complex vitamins tablet Take 1 tablet by mouth daily as needed (cold/ energy).    Taking  . carvedilol (COREG) 25 MG tablet TAKE 1 TABLET BY MOUTH TWICE DAILY 60 tablet 11   . Cholecalciferol 2000 units CAPS Take 2,000 Units by mouth 3 (three) times a week.   Taking  . Coenzyme Q10 (COQ10) 200 MG CAPS Take 200 mg by mouth daily.   Taking  . ELIQUIS 5 MG TABS tablet TAKE 1 TABLET BY MOUTH TWICE DAILY 60 tablet 11 Taking  . fluticasone (FLONASE) 50 MCG/ACT nasal spray Place 2 sprays into the nose as needed for allergies.    Taking  . furosemide (LASIX) 40 MG tablet TAKE 1 TABLET BY MOUTH DAILY 90 tablet 0 Taking  . isosorbide-hydrALAZINE (BIDIL) 20-37.5 MG per tablet Take 1 tablet by mouth 2  (two) times daily.   Taking  . levothyroxine (SYNTHROID, LEVOTHROID) 75 MCG tablet Take 1 tablet (75 mcg total) by mouth daily before breakfast. 30 tablet 11 Taking  . magnesium oxide (MAG-OX) 400 MG tablet Take 400 mg by mouth daily.   Taking  . spironolactone (ALDACTONE) 25 MG tablet TAKE 1 TABLET BY MOUTH EVERY DAY 30 tablet 11    Scheduled:  . antiseptic oral rinse  7 mL Mouth Rinse 10 times per day  . atorvastatin  20 mg Per Tube q1800  . chlorhexidine gluconate (SAGE KIT)  15 mL Mouth Rinse BID  . feeding supplement (PRO-STAT SUGAR FREE 64)  30 mL Oral Daily  . [START ON 02/04/2016] feeding supplement (VITAL HIGH PROTEIN)  1,000 mL Per Tube Q24H  . insulin aspart  2-6 Units Subcutaneous Q4H  . levothyroxine  37.5 mcg Intravenous Daily  . midazolam  2 mg Intravenous Once  . pantoprazole (PROTONIX) IV  40 mg Intravenous QHS  . sodium chloride flush  10-40 mL Intracatheter Q12H   Assessment: 65 yo male s/p cardiac arrest and s/p rewarming. Pharmacy consulted to dose zosyn and vancomycin for r/o PNA -WBC= 13, tmax= 100.9, SCr= 2.38 (trend up CrCl ~ 40).  Goal of Therapy:  Vancomycin trough level 15-20 mcg/ml  Plan:  -Zosyn 3.375gm IV q8h -Vancomycin 2000 mg x1 followed by 1265m IV q24hr -Will follow renal function, cultures and clinical progress  AHildred Laser Pharm D 02/03/2016 3:15 PM

## 2016-02-04 ENCOUNTER — Inpatient Hospital Stay (HOSPITAL_COMMUNITY): Payer: BC Managed Care – PPO

## 2016-02-04 DIAGNOSIS — I447 Left bundle-branch block, unspecified: Secondary | ICD-10-CM | POA: Diagnosis not present

## 2016-02-04 DIAGNOSIS — I469 Cardiac arrest, cause unspecified: Secondary | ICD-10-CM | POA: Diagnosis not present

## 2016-02-04 DIAGNOSIS — I4892 Unspecified atrial flutter: Secondary | ICD-10-CM | POA: Diagnosis not present

## 2016-02-04 DIAGNOSIS — I5022 Chronic systolic (congestive) heart failure: Secondary | ICD-10-CM | POA: Diagnosis not present

## 2016-02-04 DIAGNOSIS — G9340 Encephalopathy, unspecified: Secondary | ICD-10-CM | POA: Diagnosis not present

## 2016-02-04 DIAGNOSIS — I472 Ventricular tachycardia: Secondary | ICD-10-CM | POA: Diagnosis not present

## 2016-02-04 DIAGNOSIS — J9601 Acute respiratory failure with hypoxia: Secondary | ICD-10-CM | POA: Diagnosis not present

## 2016-02-04 DIAGNOSIS — I4901 Ventricular fibrillation: Secondary | ICD-10-CM | POA: Diagnosis not present

## 2016-02-04 LAB — BASIC METABOLIC PANEL
Anion gap: 10 (ref 5–15)
BUN: 44 mg/dL — AB (ref 6–20)
CHLORIDE: 112 mmol/L — AB (ref 101–111)
CO2: 18 mmol/L — ABNORMAL LOW (ref 22–32)
CREATININE: 2.31 mg/dL — AB (ref 0.61–1.24)
Calcium: 8.5 mg/dL — ABNORMAL LOW (ref 8.9–10.3)
GFR calc Af Amer: 33 mL/min — ABNORMAL LOW (ref >60)
GFR calc non Af Amer: 28 mL/min — ABNORMAL LOW (ref >60)
Glucose, Bld: 111 mg/dL — ABNORMAL HIGH (ref 65–99)
Potassium: 4.9 mmol/L (ref 3.5–5.1)
SODIUM: 140 mmol/L (ref 135–145)

## 2016-02-04 LAB — CBC
HCT: 39.3 % (ref 39.0–52.0)
Hemoglobin: 12.6 g/dL — ABNORMAL LOW (ref 13.0–17.0)
MCH: 30.9 pg (ref 26.0–34.0)
MCHC: 32.1 g/dL (ref 30.0–36.0)
MCV: 96.3 fL (ref 78.0–100.0)
Platelets: 197 10*3/uL (ref 150–400)
RBC: 4.08 MIL/uL — ABNORMAL LOW (ref 4.22–5.81)
RDW: 15 % (ref 11.5–15.5)
WBC: 10.5 10*3/uL (ref 4.0–10.5)

## 2016-02-04 LAB — PROCALCITONIN: PROCALCITONIN: 1.77 ng/mL

## 2016-02-04 LAB — GLUCOSE, CAPILLARY
GLUCOSE-CAPILLARY: 101 mg/dL — AB (ref 65–99)
GLUCOSE-CAPILLARY: 73 mg/dL (ref 65–99)
GLUCOSE-CAPILLARY: 104 mg/dL — AB (ref 65–99)
Glucose-Capillary: 82 mg/dL (ref 65–99)

## 2016-02-04 LAB — APTT: APTT: 83 s — AB (ref 24–37)

## 2016-02-04 LAB — HEPARIN LEVEL (UNFRACTIONATED): HEPARIN UNFRACTIONATED: 0.87 [IU]/mL — AB (ref 0.30–0.70)

## 2016-02-04 MED ORDER — LEVOTHYROXINE SODIUM 75 MCG PO TABS
75.0000 ug | ORAL_TABLET | Freq: Every day | ORAL | Status: DC
  Administered 2016-02-04 – 2016-02-07 (×4): 75 ug
  Filled 2016-02-04 (×4): qty 1

## 2016-02-04 MED ORDER — PANTOPRAZOLE SODIUM 40 MG PO PACK
40.0000 mg | PACK | Freq: Every day | ORAL | Status: DC
  Administered 2016-02-04 – 2016-02-07 (×3): 40 mg
  Filled 2016-02-04 (×3): qty 20

## 2016-02-04 NOTE — Progress Notes (Addendum)
ANTICOAGULATION CONSULT NOTE - Follow Up Consult  Pharmacy Consult for heparin Indication: atrial fibrillation   Allergies  Allergen Reactions  . Lactose Intolerance (Gi) Diarrhea    Patient Measurements: Height: 5\' 9"  (175.3 cm) Weight: 249 lb 1.9 oz (113 kg) IBW/kg (Calculated) : 70.7 Heparin Dosing Weight: 95kg  Vital Signs: Temp: 98.4 F (36.9 C) (05/12 0700) Temp Source: Core (Comment) (05/12 0700) Pulse Rate: 81 (05/12 0723)  Labs:  Recent Labs  02/01/16 1115  02/01/16 1215 02/01/16 1759  02/02/16 0350 02/02/16 0736 02/02/16 0830  02/02/16 2149 02/03/16 0340 02/04/16 0345 02/04/16 0346  HGB  --   --   --   --   < > 13.4 15.0  --   --   --  12.7*  --  12.6*  HCT  --   --   --   --   < > 39.8 44.0  --   --   --  39.1  --  39.3  PLT  --   --   --   --   --  228  --   --   --   --  212  --  197  APTT  --   --   --  85*  --  77*  --   --   < > 74* 85*  --  83*  LABPROT 18.2*  --   --   --   --   --   --   --   --   --   --   --   --   INR 1.50*  --   --   --   --   --   --   --   --   --   --   --   --   HEPARINUNFRC  --   --   --   --   --  1.60*  --  1.24*  --   --  0.98* 0.87*  --   CREATININE  --   < > 1.65* 1.59*  < > 2.00* 2.00*  --   --   --  2.38*  --  2.31*  TROPONINI  --   --  0.08* 0.06*  --   --   --   --   --   --   --   --   --   < > = values in this interval not displayed.  Estimated Creatinine Clearance: 40 mL/min (by C-G formula based on Cr of 2.31).   Medical History: Past Medical History  Diagnosis Date  . Claustrophobia   . Heart murmur   . Hypertension   . Varicose vein of leg     right  . Dysrhythmia     "palpitations"  . Migraine 02/13/12    "opthalmic"  . Chronic lower back pain   . Exertional dyspnea 01/2012  . CHF (congestive heart failure) ([place])   . Hypothyroidism   . Chronic kidney disease     kidney fx studies increased      Assessment: 65yo male presents to hospital s/p cardiac arrest w/ shockable rhythm in the  field, s/p hypothermia protocol, on heparin drip 900 uts/hr  for Afib; Eliquis PTA though timing of last dose unclear.    HL continues to be elevated and not correlate with APTT. APTT this AM remains therapeutic 83 sec. No issues with infusion  CBC stable but bleeding from ET tube and foley per RN assessment.  Goal of Therapy:  Heparin level 0.3-0.7 units/ml aPTT 66-102 seconds Monitor platelets by anticoagulation protocol: Yes   Plan:  Continue heparin 900 units/hr Daily HL/APTT/CBC Monitor for s/sx bleeding   Bonnita Nasuti Pharm.D. CPP, BCPS Clinical Pharmacist [phone] 02/04/2016 8:08 AM

## 2016-02-04 NOTE — Progress Notes (Signed)
Patient Name: Eric [name] Date of Encounter: 02/04/2016  Primary Cardiologist: Dr. Tamala Julian   Patient profile: 65 yo male with h/o of NICM, afib on eliquis, LBBB presented with cardiac arrest while talking to his wife in the car. CPR started 1-2 min later. Upon EMS arrival, he received AED shock x 2 and achieved ROSC.    Principal Problem:   Cardiac arrest [facility]) Active Problems:   Chronic systolic heart failure (HCC)   Hyperlipidemia   Left bundle branch block   Obesity (BMI 30-39.9)   Atrial flutter ([place])   Acute respiratory failure ([place])   Encounter for central line placement   Arrhythmia   Altered mental status    SUBJECTIVE  Intubated. Warmed. Did not wake up for me this morning.   CURRENT MEDS . antiseptic oral rinse  7 mL Mouth Rinse 10 times per day  . atorvastatin  20 mg Per Tube q1800  . chlorhexidine gluconate (SAGE KIT)  15 mL Mouth Rinse BID  . feeding supplement (PRO-STAT SUGAR FREE 64)  30 mL Oral Daily  . feeding supplement (VITAL HIGH PROTEIN)  1,000 mL Per Tube Q24H  . insulin aspart  2-6 Units Subcutaneous Q4H  . levothyroxine  37.5 mcg Intravenous Daily  . midazolam  2 mg Intravenous Once  . pantoprazole (PROTONIX) IV  40 mg Intravenous QHS  . piperacillin-tazobactam (ZOSYN)  IV  3.375 g Intravenous Q8H  . sodium chloride flush  10-40 mL Intracatheter Q12H  . vancomycin  1,250 mg Intravenous Q24H    OBJECTIVE  Filed Vitals:   02/04/16 0500 02/04/16 0600 02/04/16 0700 02/04/16 0723  BP:      Pulse: 80 78 78 81  Temp: 98.8 F (37.1 C) 98.4 F (36.9 C) 98.4 F (36.9 C)   TempSrc: Core (Comment) Core (Comment) Core (Comment)   Resp: _0 Height:      Weight:      SpO2: 100% 100% 100% 100%    Intake/Output Summary (Last 24 hours) at 02/04/16 0757 Last data filed at 02/04/16 0600  Gross per 24 hour  Intake 2509.04 ml  Output   1175 ml  Net 1334.04 ml   Filed Weights   02/02/16 0500 02/03/16 0400 02/04/16 0300  Weight: 245 lb  8.1 oz (111.36 kg) 246 lb 15 oz (112.01 kg) 249 lb 1.9 oz (113 kg)    PHYSICAL EXAM  General: Intubated, did not wake up this morning Neuro: unable to assess HEENT:  Normal. Intubated. Sedation weaning off  Neck: supple Lungs:  On ventilator. Intubated. Anterior exam CTA.  Heart: RRR no s3, s4, or murmurs. Difficult to hear aortic murmur Abdomen: Soft, non-distended, BS + x 4.  Extremities: No clubbing, cyanosis or edema.  Accessory Clinical Findings  CBC  Recent Labs  02/03/16 0340 02/04/16 0346  WBC 13.0* 10.5  HGB 12.7* 12.6*  HCT 39.1 39.3  MCV 97.3 96.3  PLT 212 440   Basic Metabolic Panel  Recent Labs  02/02/16 0200  02/03/16 0340 02/04/16 0346  NA 145  < > 143 140  K 3.8  < > 4.8 4.9  CL 114*  < > 113* 112*  CO2 20*  < > 17* 18*  GLUCOSE 105*  < > 82 111*  BUN 20  < > 27* 44*  CREATININE 1.66*  < > 2.38* 2.31*  CALCIUM 9.2  < > 8.6* 8.5*  MG 2.1  --  2.2  --   PHOS 3.0  --   --   --   < > =  values in this interval not displayed. Liver Function Tests  Recent Labs  02/02/16 0350  AST 75*  ALT 69*  ALKPHOS 45  BILITOT 0.9  PROT 7.3  ALBUMIN 3.5   Cardiac Enzymes  Recent Labs  02/01/16 1215 02/01/16 1759  TROPONINI 0.08* 0.06*   TELE LBBB with frequently missed beat, HR improved this morning.     ECG  No new EKG  Echocardiogram 02/01/2016  LV EF: 30% - 35%  ------------------------------------------------------------------- Indications: Cardiac arrest 427.5.  ------------------------------------------------------------------- History: PMH: Atrial fibrillation. Atrial flutter. Congestive heart failure.  ------------------------------------------------------------------- Study Conclusions  - Left ventricle: The cavity size was severely dilated. There was  mild focal basal hypertrophy of the septum. Systolic function was  moderately to severely reduced. The estimated ejection fraction  was in the range of 30%  to 35%. Diffuse hypokinesis. Features are  consistent with a pseudonormal left ventricular filling pattern,  with concomitant abnormal relaxation and increased filling  pressure (grade 2 diastolic dysfunction). Doppler parameters are  consistent with elevated ventricular end-diastolic filling  pressure. - Ventricular septum: Septal motion showed abnormal function and  dyssynergy. - Aortic valve: There was severe regurgitation. - Aortic root: The aortic root was dilated measurinh 49 mm. - Ascending aorta: The ascending aorta was dilated measuring 43 mm. - Left atrium: The atrium was moderately dilated. - Right ventricle: The cavity size was normal. Wall thickness was  normal. Systolic function was moderately reduced. - Right atrium: The atrium was normal in size. - Tricuspid valve: There was mild regurgitation. - Pulmonic valve: There was moderate regurgitation. - Pulmonary arteries: Systolic pressure was within the normal  range. PA peak pressure: 32 mm Hg (S). - Inferior vena cava: The vessel was normal in size. - Pericardium, extracardiac: There was no pericardial effusion.  There was a left pleural effusion.  Impressions:  - Dilated aortc root, with bicuspid aortic valve and severe aortic  regurgitation with eccentric jet. Mildly dilated ascending aorta.    Radiology/Studies  Ct Head Wo Contrast  02/01/2016  CLINICAL DATA:  Cardiac arrest EXAM: CT HEAD WITHOUT CONTRAST TECHNIQUE: Contiguous axial images were obtained from the base of the skull through the vertex without intravenous contrast. COMPARISON:  None. FINDINGS: There is no intracranial hemorrhage, mass or evidence of acute infarction. There is no extra-axial fluid collection. Gray matter and white matter appear normal. Cerebral volume is normal for age. Brainstem and posterior fossa are unremarkable. The CSF spaces appear normal. The bony structures are intact. The visible portions of the paranasal sinuses are  clear. The orbits are unremarkable. IMPRESSION: Normal brain Electronically Signed   By: Andreas Newport M.D.   On: 02/01/2016 00:49   Dg Chest Port 1 View  02/04/2016  CLINICAL DATA:  Cardiac arrest, acute respiratory failure, chronic CHF. EXAM: PORTABLE CHEST 1 VIEW COMPARISON:  Portable chest x-ray of Feb 03, 2016 FINDINGS: The lungs are less well inflated today. Mild interstitial prominence on the left persists. The left hemidiaphragm remains partially obscured. External pacemaker defibrillator pad on the left is again demonstrated. The cardiac silhouette remains enlarged. The pulmonary vascularity is mildly prominent centrally but slightly less engorged overall. The endotracheal tube tip lies approximately 4.5 cm above the carina. The esophagogastric tube tip projects below the inferior margin of the image. The left internal jugular venous catheter tip projects over the midportion of the SVC. IMPRESSION: Stable appearance of the chest allowing for mild hypoinflation. Stable mild CHF. Slight improvement in left lower lobe atelectasis. The support tubes are  in stable position. Electronically Signed   By: David  Martinique M.D.   On: 02/04/2016 07:29   Dg Chest Port 1 View  02/03/2016  CLINICAL DATA:  Acute respiratory failure EXAM: PORTABLE CHEST 1 VIEW COMPARISON:  02/02/2016 FINDINGS: Cardiac shadow remains enlarged. Left jugular catheter, nasogastric catheter and endotracheal tube are again seen and stable. Increased density in the left retrocardiac region is noted consistent with increasing lower lobe atelectasis/consolidation. Mild right basilar atelectasis is noted as well. No bony abnormality is seen. IMPRESSION: Increasing left basilar consolidation. New right basilar atelectasis. Electronically Signed   By: Inez Catalina M.D.   On: 02/03/2016 07:06   Dg Chest Port 1 View  02/02/2016  CLINICAL DATA:  Cardiac arrest, intubated patient, acute respiratory failure EXAM: PORTABLE CHEST 1 VIEW COMPARISON:   Portable chest x-ray of Feb 01, 2016 FINDINGS: The lungs are adequately inflated. Subsegmental atelectasis at the left lung base is present. The cardiac silhouette is enlarged. The pulmonary vascularity is less engorged today. External pacemaker defibrillator pads remain present. There is no pleural effusion or pneumothorax. The endotracheal tube tip lies 6 cm above the carina. The esophagogastric tube tip projects below the inferior margin of the image. The left internal jugular venous catheter tip projects over the midportion of the SVC. IMPRESSION: Improved appearance of the thorax with decreasing pulmonary interstitial edema. Minimal left lower lobe subsegmental atelectasis is present. The support tubes are in stable position. Electronically Signed   By: David  Martinique M.D.   On: 02/02/2016 07:22   Dg Chest Port 1 View  02/01/2016  CLINICAL DATA:  Central line placement EXAM: PORTABLE CHEST 1 VIEW COMPARISON:  01/31/2016 FINDINGS: The endotracheal tube tip is 3.2 cm above the carina. There is a new left jugular central line which extends to the expected location of the SVC 1.5 cm below the azygos vein junction. The nasogastric tube extends into the stomach and beyond the inferior edge of the image. A percutaneous pacing patch superimposes the left hemi thorax. Airspace opacities are present in the left central lung. This could represent pneumonia, hemorrhage, aspiration. Right lung is clear. No pneumothorax. IMPRESSION: Support equipment appears satisfactorily positioned. Central left lung airspace opacity, appears worsened. Electronically Signed   By: Andreas Newport M.D.   On: 02/01/2016 02:35   Dg Chest Port 1 View  01/31/2016  CLINICAL DATA:  Respiratory distress for 1 day. EXAM: PORTABLE CHEST 1 VIEW COMPARISON:  10/11/2012 FINDINGS: The endotracheal tube is 4.5 cm above the carina. No consolidation. No large effusions. No pneumothorax. IMPRESSION: Satisfactorily positioned ETT. Electronically Signed    By: Andreas Newport M.D.   On: 01/31/2016 22:25    ASSESSMENT AND PLAN  1. Cardiac arrest s/p 2 AED shock:   - unclear etiology, AED indicated shockable rhythm but he has history of flutter/fib and LBBB   - given cardiac arrest, drop in EF, and severe AR and bicuspid AV on echo and sudden onset of SOB prior to cardiac arrest, will need ischemic workup likely left and right heart cath once neurological condition improve  - he also has markedly prolonged QTc, therefore cannot r/o arrhythmia. Potassium low. IV amiodarone stopped given QT prolongation and 2nd degree AV block and bradycardia.  - rewarmed after being cooled for < 24 hours. Continue to have episodic fever overnight, started on abx, CXR shows possible mild CHF, off home lasix given intubation, likely restart with IV lasix in the next two days once renal function improve. Does not appear to have  urgent need for lasix at this point, breathing ok. Started to have red tinged sputum this morning or last night, further management per pumonology. Assess mental status and renal function once extubated, plan for L&RHC once renal function improve. If no significant underlying CAD and no other explainable cause for cardiac arrest, will need to think about ICD.  2. PAF on eliquis  - CHA2DS2-Vasc score 2 (HTN, HF)  3. Chronic LBBB  4. Aortic aneurysm: 4.9 cm CXR does not support acute dissection but should have CT after extubated and Cr improved  5. NICM/Chronic systolic HF: EF was 20-35% on echo 03/22/2012, EF 40-45% on echo 01/19/2016  - Echo 02/01/2016 EF 30-35%, diffuse hypokinesis, grade 2 DD, severe AR, dilated aortic root 84m, mild TR, moderate PR, PA peak pressure 372mg, no pericardial effusion  6. HTN: BP improving after stopping sedation, will hold off restart coreg at this time given frequent dropped beats and bradycardia on telemetry  7. Severe AR: worsened on echo 02/01/2016 compare to previous echo in 01/19/2016, unclear cause for  worsening after only 2 weeks  8. Acute on CKD stage III, baseline Cr appears to be 1.5, trended up to 2.3, now trending down  - Cr appears to have plateaued, trending down now   Signed, HaAlmyra DeforestA-C Pager: [phone] Attending Note:   The patient was seen and examined.  Agree with assessment and plan as noted above.  Changes made to the above note as needed.  He has a hx of nonischemic CM and presented with a cardiac arrest - likely VF. Agree with plans for cath -  Would like to have some improvement in his renal function .  At present , he has still not woken up .  Will see how he does over the weekend and possibly cath early next week.    PhThayer HeadingsJrBrooke Bonito MD, FAJohnston Medical Center - [place]/08/2016, 12:09 PM [address] [place] [phone]

## 2016-02-04 NOTE — Progress Notes (Signed)
PULMONARY / CRITICAL CARE MEDICINE   Name: Eric [name] MRN: [id number] DOB: 06-17-1951    ADMISSION DATE:  01/31/2016 CONSULTATION DATE:  01/31/16  REFERRING MD:  EDP  CHIEF COMPLAINT:  Cardiac Arrest  HISTORY OF PRESENT ILLNESS:  Pt is encephelopathic; therefore, this HPI is obtained from chart review. Eric [name] is a 65 y.o. male with PMH as outlined below including known systolic heart failure and A.fib on Eliquis.  He was in his USOH until evening of 05/08 when he was with his wife at [place].  As they were leaving [employer], he began driving the car and while still in the parking lot, he began to breath very heavily before becoming unresponsive.  His wife immediately got the car stopped, got him out of the car and began CPR while calling EMS.  She feels that EMS arrived within 5 - 10 minutes and upon their arrival, he received 2 defibrillations via AED.  ROSC was achieved within roughly 15 - 20 minutes.  Upon ED arrival, he remained unresponsive and was subsequently intubated.  PCCM was called for admission and consideration of hypothermia protocol.  Cardiology was also called in consultation.  Per his wife, he had not had any complaints recently and had behaved completely normal.  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   SUBJECTIVE:  Pt intubated sedated on Fentanyl and Versed currently not following commands spoke with RN there is  concern pt is having episodic temps and having small amounts of bright red blood in his ETT and around his foley catheter pts currently on a Heparin drip. Spoke with RN asked RN to turn off all sedation to perform a WUA will attempt a SBT if pt is able to follow commands   VITAL SIGNS: BP 129/52 mmHg  Pulse 81  Temp(Src) 98.4 F (36.9 C) (Core (Comment))  Resp 22  Ht 5\' 9"  (1.753 m)  Wt 249 lb 1.9 oz (113 kg)  BMI 36.77 kg/m2  SpO2 100%  HEMODYNAMICS: CVP:  [9 mmHg-19 mmHg] 9  mmHg  VENTILATOR SETTINGS: Vent Mode:  [-] PRVC FiO2 (%):  [40 %] 40 % Set Rate:  [18 bmp] 18 bmp Vt Set:  [600 mL] 600 mL PEEP:  [5 cmH20] 5 cmH20 Pressure Support:  [12 cmH20] 12 cmH20 Plateau Pressure:  [28 cmH20-32 cmH20] 29 cmH20  INTAKE / OUTPUT: I/O last 3 completed shifts: In: 3287.4 [I.V.:1732.1; NG/GT:1005.3; IV Piggyback:550] Out: [id number] [Urine:1685]   PHYSICAL EXAMINATION: General: Adult male, in NAD. Neuro: sedated not following commands  HEENT: [place]/AT, pupils 2 mm reactive, sclerae anicteric. Cardiovascular: NSR, s1s2, no M/R/G.  Lungs: Respirations even and unlabored.  CTA bilaterally, No W/R/R.  Abdomen: BS x 4, soft, NT/ND.  Musculoskeletal: No gross deformities, no edema, moves all extremities  Skin: Intact, warm, no rashes.  LABS:  BMET  Recent Labs Lab 02/02/16 0350 02/02/16 0736 02/03/16 0340 02/04/16 0346  NA 145 146* 143 140  K 4.5 4.2 4.8 4.9  CL 114* 112* 113* 112*  CO2 23  --  17* 18*  BUN 22* 24* 27* 44*  CREATININE 2.00* 2.00* 2.38* 2.31*  GLUCOSE 98 95 82 111*    Electrolytes  Recent Labs Lab 02/01/16 0450  02/02/16 0200 02/02/16 0350 02/03/16 0340 02/04/16 0346  CALCIUM 8.6*  < > 9.2 9.0 8.6* 8.5*  MG 1.8  --  2.1  --  2.2  --   PHOS 1.6*  --  3.0  --   --   --   < > =  values in this interval not displayed.  CBC  Recent Labs Lab 02/02/16 0350 02/02/16 0736 02/03/16 0340 02/04/16 0346  WBC 9.3  --  13.0* 10.5  HGB 13.4 15.0 12.7* 12.6*  HCT 39.8 44.0 39.1 39.3  PLT 228  --  212 197    Coag's  Recent Labs Lab 02/01/16 0105 02/01/16 0653  02/01/16 1115  02/02/16 2149 02/03/16 0340 02/04/16 0346  APTT 25 42*  < >  --   < > 74* 85* 83*  INR 1.29 1.51*  --  1.50*  --   --   --   --   < > = values in this interval not displayed.  Sepsis Markers  Recent Labs Lab 02/01/16 0200 02/01/16 0450 02/03/16 1135 02/04/16 0346  LATICACIDVEN 1.2 1.4  --   --   PROCALCITON  --   --  1.52 1.77    ABG  Recent  Labs Lab 01/31/16 2308 02/03/16 0928  PHART 7.343* 7.312*  PCO2ART 43.2 37.3  PO2ART 286.0* 91.0    Liver Enzymes  Recent Labs Lab 01/31/16 2206 02/02/16 0350  AST 140* 75*  ALT 84* 69*  ALKPHOS 51 45  BILITOT 1.2 0.9  ALBUMIN 3.6 3.5    Cardiac Enzymes  Recent Labs Lab 02/01/16 0653 02/01/16 1215 02/01/16 1759  TROPONINI 0.11* 0.08* 0.06*    Glucose  Recent Labs Lab 02/03/16 1249 02/03/16 1536 02/03/16 2017 02/03/16 2317 02/04/16 0350 02/04/16 0813  GLUCAP 80 98 139* 84 101* 82    Imaging Dg Chest Port 1 View  02/04/2016  CLINICAL DATA:  Cardiac arrest, acute respiratory failure, chronic CHF. EXAM: PORTABLE CHEST 1 VIEW COMPARISON:  Portable chest x-ray of Feb 03, 2016 FINDINGS: The lungs are less well inflated today. Mild interstitial prominence on the left persists. The left hemidiaphragm remains partially obscured. External pacemaker defibrillator pad on the left is again demonstrated. The cardiac silhouette remains enlarged. The pulmonary vascularity is mildly prominent centrally but slightly less engorged overall. The endotracheal tube tip lies approximately 4.5 cm above the carina. The esophagogastric tube tip projects below the inferior margin of the image. The left internal jugular venous catheter tip projects over the midportion of the SVC. IMPRESSION: Stable appearance of the chest allowing for mild hypoinflation. Stable mild CHF. Slight improvement in left lower lobe atelectasis. The support tubes are in stable position. Electronically Signed   By: David  Martinique M.D.   On: 02/04/2016 07:29     STUDIES:  CXR 05/08 >>no acute process. CT head 05/08 >>no acute process Echo 5/9>>EF 30 to 35%, severe aortic regurgitation EEG 5/9>>no focal, hemispheric, or lateralizing features; demonstrated a burst/suppression pattern, which can be consistent with a sedated EEG  EEG 5/11>>  CULTURES: Blood 05/08 >>no growth 5/11>> Urine 05/08 >>no growth 5/10 Sputum  05/08 >> MRSA PCR 5/9>>Negative  ANTIBIOTICS: Zosyn 5/11>> Vancomycin 5/11>>  SIGNIFICANT EVENTS: 05/08 - admitted after cardiac arrest.  Started on TTM - 33 degrees C. 5/10-pt rewarmed per hypothermia protocol  LINES/TUBES: ETT 05/08 > R radial a line 05/08 > Right IJ CVL 05/08 >  DISCUSSION: 65 y.o. M with known sCHF and A.fib, presented 05/08 after out of hospital cardiac arrest.  ROSC within 15-20 minutes after defib x 2.  Started on hypothermia protocol, 33 degrees celsius 5/8 rewarmed 5/10  ASSESSMENT / PLAN:  CARDIOVASCULAR A:  Cardiac arrest - VF got shocked, r/o ischemia Aortic aneurysm Hx HTN, A.fib on Eliquis, sCHF (EF 40-45% per echo from April 2017) P:  Goal MAP > 65 Levophed PRN for above goal Heparin gtt in lieu of outpatient eliquis-pt starting to have small amount of  bright red blood from ETT and and around foley catheter consider d/c Heparin drip if bleeding continues Cards following, appreciate the assistance 4.9 cm aortic aneurysm per cards will need CT of chest once extubated and Cr improved  Per Cards will require R/L cath once awake and neurologically intact Continue outpatient atorvastatin Hold outpatient carvedilol, eliquis, furosemide, bidil, spironolactone  NEUROLOGIC A:   Acute encephalopathy - concern for anoxia given exam findings Hx migraines, chronic lower back pain. P:   Sedation:  Fentanyl and Versed gtt RASS goal -1 WUA daily  SBT's daily Trend ABG's Neuro consult appreciate input  PULMONARY A: Pneumonia Mild pulmonary edema Acute hypoxic respiratory failure - in the setting of cardiac arrest P:   Continue antibiotics as listed above Consider restarting outpatient Furosemide once renal function improves Full vent support-PRVC 18, 600, 5, 40% Wean as able VAP prevention measures CXR in AM Trend procalcitonins and monitor fever curve  RENAL A:   AoCKD. Anticipate multiple metabolic derangements during  cooling. Hyperkalemia-resolved P:   Correct electrolytes as indicated. Trend BMP's  GASTROINTESTINAL A:   GI prophylaxis. Nutrition. P:  SUP: Pantoprazole. Tube feedings   HEMATOLOGIC A:   VTE Prophylaxis. P:  SCD's  Heparin drip-may need to consider d/c if continued s/sx of bleeding Trend CBC's and Coags Monitor for s/sx of bleeding  INFECTIOUS A:  Pneumonia P:   Continue antibiotics as listed above Repeat blood cultures, urine culture, and sputum culture Trend WBC's, procalcitonins and monitor fever curve  ENDOCRINE A:  Hypoglycemia Hypothyroidism. P:   Continue outpatient synthroid Assess TSH Continue SS insulin ICU hyperglycemia/hypoglycemic protocol   Family updated: No family at bedside will discuss plan of care once family arrives  Marda Stalker, [place]

## 2016-02-05 ENCOUNTER — Other Ambulatory Visit: Payer: Self-pay | Admitting: Interventional Cardiology

## 2016-02-05 ENCOUNTER — Inpatient Hospital Stay (HOSPITAL_COMMUNITY): Payer: BC Managed Care – PPO

## 2016-02-05 DIAGNOSIS — I4901 Ventricular fibrillation: Secondary | ICD-10-CM | POA: Diagnosis not present

## 2016-02-05 DIAGNOSIS — I447 Left bundle-branch block, unspecified: Secondary | ICD-10-CM | POA: Diagnosis not present

## 2016-02-05 DIAGNOSIS — N179 Acute kidney failure, unspecified: Secondary | ICD-10-CM | POA: Diagnosis not present

## 2016-02-05 DIAGNOSIS — J189 Pneumonia, unspecified organism: Secondary | ICD-10-CM | POA: Diagnosis not present

## 2016-02-05 DIAGNOSIS — I5022 Chronic systolic (congestive) heart failure: Secondary | ICD-10-CM | POA: Diagnosis not present

## 2016-02-05 DIAGNOSIS — J9600 Acute respiratory failure, unspecified whether with hypoxia or hypercapnia: Secondary | ICD-10-CM

## 2016-02-05 DIAGNOSIS — G9340 Encephalopathy, unspecified: Secondary | ICD-10-CM | POA: Diagnosis not present

## 2016-02-05 DIAGNOSIS — I472 Ventricular tachycardia: Secondary | ICD-10-CM | POA: Diagnosis not present

## 2016-02-05 DIAGNOSIS — I469 Cardiac arrest, cause unspecified: Secondary | ICD-10-CM | POA: Diagnosis not present

## 2016-02-05 DIAGNOSIS — J9601 Acute respiratory failure with hypoxia: Secondary | ICD-10-CM | POA: Diagnosis not present

## 2016-02-05 DIAGNOSIS — I4892 Unspecified atrial flutter: Secondary | ICD-10-CM | POA: Diagnosis not present

## 2016-02-05 LAB — BASIC METABOLIC PANEL
ANION GAP: 9 (ref 5–15)
Anion gap: 11 (ref 5–15)
BUN: 48 mg/dL — AB (ref 6–20)
BUN: 52 mg/dL — ABNORMAL HIGH (ref 6–20)
CHLORIDE: 113 mmol/L — AB (ref 101–111)
CHLORIDE: 112 mmol/L — AB (ref 101–111)
CO2: 21 mmol/L — AB (ref 22–32)
CO2: 20 mmol/L — AB (ref 22–32)
CREATININE: 2.31 mg/dL — AB (ref 0.61–1.24)
Calcium: 8.9 mg/dL (ref 8.9–10.3)
Calcium: 9.5 mg/dL (ref 8.9–10.3)
Creatinine, Ser: 2.17 mg/dL — ABNORMAL HIGH (ref 0.61–1.24)
GFR calc Af Amer: 35 mL/min — ABNORMAL LOW (ref >60)
GFR calc non Af Amer: 28 mL/min — ABNORMAL LOW (ref >60)
GFR, EST AFRICAN AMERICAN: 33 mL/min — AB (ref >60)
GFR, EST NON AFRICAN AMERICAN: 30 mL/min — AB (ref >60)
GLUCOSE: 139 mg/dL — AB (ref 65–99)
GLUCOSE: 185 mg/dL — AB (ref 65–99)
POTASSIUM: 4.2 mmol/L (ref 3.5–5.1)
Potassium: 4.9 mmol/L (ref 3.5–5.1)
Sodium: 143 mmol/L (ref 135–145)
Sodium: 143 mmol/L (ref 135–145)

## 2016-02-05 LAB — CBC
HCT: 40 % (ref 39.0–52.0)
HEMATOCRIT: 34.9 % — AB (ref 39.0–52.0)
Hemoglobin: 11.3 g/dL — ABNORMAL LOW (ref 13.0–17.0)
Hemoglobin: 13 g/dL (ref 13.0–17.0)
MCH: 30.7 pg (ref 26.0–34.0)
MCH: 31.1 pg (ref 26.0–34.0)
MCHC: 32.4 g/dL (ref 30.0–36.0)
MCHC: 32.5 g/dL (ref 30.0–36.0)
MCV: 94.8 fL (ref 78.0–100.0)
MCV: 95.7 fL (ref 78.0–100.0)
PLATELETS: 229 10*3/uL (ref 150–400)
Platelets: 184 10*3/uL (ref 150–400)
RBC: 3.68 MIL/uL — ABNORMAL LOW (ref 4.22–5.81)
RBC: 4.18 MIL/uL — AB (ref 4.22–5.81)
RDW: 14.5 % (ref 11.5–15.5)
RDW: 14.5 % (ref 11.5–15.5)
WBC: 8.1 10*3/uL (ref 4.0–10.5)
WBC: 11.5 10*3/uL — ABNORMAL HIGH (ref 4.0–10.5)

## 2016-02-05 LAB — GLUCOSE, CAPILLARY
GLUCOSE-CAPILLARY: 126 mg/dL — AB (ref 65–99)
GLUCOSE-CAPILLARY: 139 mg/dL — AB (ref 65–99)
GLUCOSE-CAPILLARY: 185 mg/dL — AB (ref 65–99)
GLUCOSE-CAPILLARY: 185 mg/dL — AB (ref 65–99)
Glucose-Capillary: 127 mg/dL — ABNORMAL HIGH (ref 65–99)
Glucose-Capillary: 187 mg/dL — ABNORMAL HIGH (ref 65–99)

## 2016-02-05 LAB — URINE CULTURE
Culture: NO GROWTH
SPECIAL REQUESTS: NORMAL

## 2016-02-05 LAB — BRAIN NATRIURETIC PEPTIDE: B Natriuretic Peptide: 804.7 pg/mL — ABNORMAL HIGH (ref 0.0–100.0)

## 2016-02-05 LAB — MAGNESIUM: Magnesium: 2.4 mg/dL (ref 1.7–2.4)

## 2016-02-05 LAB — PHOSPHORUS: Phosphorus: 3.2 mg/dL (ref 2.5–4.6)

## 2016-02-05 LAB — PROCALCITONIN: PROCALCITONIN: 0.98 ng/mL

## 2016-02-05 LAB — TROPONIN I: Troponin I: 0.08 ng/mL — ABNORMAL HIGH (ref <0.031)

## 2016-02-05 MED ORDER — DEXMEDETOMIDINE HCL IN NACL 200 MCG/50ML IV SOLN
0.4000 ug/kg/h | INTRAVENOUS | Status: DC
  Administered 2016-02-05: 0.5 ug/kg/h via INTRAVENOUS
  Administered 2016-02-05: 0.7 ug/kg/h via INTRAVENOUS
  Administered 2016-02-05: 0.4 ug/kg/h via INTRAVENOUS
  Administered 2016-02-05: 0.7 ug/kg/h via INTRAVENOUS
  Administered 2016-02-05: 1 ug/kg/h via INTRAVENOUS
  Administered 2016-02-05: 0.7 ug/kg/h via INTRAVENOUS
  Filled 2016-02-05 (×7): qty 50

## 2016-02-05 MED ORDER — AMIODARONE HCL IN DEXTROSE 360-4.14 MG/200ML-% IV SOLN
INTRAVENOUS | Status: AC
  Filled 2016-02-05: qty 200

## 2016-02-05 MED ORDER — AMIODARONE HCL IN DEXTROSE 360-4.14 MG/200ML-% IV SOLN
30.0000 mg/h | INTRAVENOUS | Status: DC
  Administered 2016-02-06 – 2016-02-08 (×6): 30 mg/h via INTRAVENOUS
  Filled 2016-02-05 (×5): qty 200

## 2016-02-05 MED ORDER — AMIODARONE HCL IN DEXTROSE 360-4.14 MG/200ML-% IV SOLN
60.0000 mg/h | INTRAVENOUS | Status: DC
  Administered 2016-02-05 (×2): 60 mg/h via INTRAVENOUS
  Filled 2016-02-05: qty 200

## 2016-02-05 MED ORDER — FUROSEMIDE 10 MG/ML IJ SOLN
40.0000 mg | Freq: Once | INTRAMUSCULAR | Status: AC
  Administered 2016-02-05: 40 mg via INTRAVENOUS
  Filled 2016-02-05: qty 4

## 2016-02-05 MED ORDER — AMIODARONE LOAD VIA INFUSION
150.0000 mg | Freq: Once | INTRAVENOUS | Status: AC
  Administered 2016-02-05: 150 mg via INTRAVENOUS
  Filled 2016-02-05: qty 83.34

## 2016-02-05 MED ORDER — DEXMEDETOMIDINE HCL IN NACL 400 MCG/100ML IV SOLN
0.4000 ug/kg/h | INTRAVENOUS | Status: DC
  Administered 2016-02-06: 0.8 ug/kg/h via INTRAVENOUS
  Administered 2016-02-06 (×2): 1 ug/kg/h via INTRAVENOUS
  Filled 2016-02-05 (×3): qty 100

## 2016-02-05 MED ORDER — FENTANYL CITRATE (PF) 100 MCG/2ML IJ SOLN
25.0000 ug | INTRAMUSCULAR | Status: DC | PRN
  Administered 2016-02-05: 100 ug via INTRAVENOUS
  Filled 2016-02-05: qty 2

## 2016-02-05 MED ORDER — MIDAZOLAM HCL 2 MG/2ML IJ SOLN
2.0000 mg | INTRAMUSCULAR | Status: DC | PRN
Start: 2016-02-05 — End: 2016-02-06
  Administered 2016-02-05 – 2016-02-06 (×3): 2 mg via INTRAVENOUS
  Filled 2016-02-05 (×3): qty 2

## 2016-02-05 NOTE — Progress Notes (Signed)
PULMONARY / CRITICAL CARE MEDICINE   Name: Eric [name] MRN: [id number] DOB: 1951/08/17    ADMISSION DATE:  01/31/2016 CONSULTATION DATE:  01/31/16  REFERRING MD:  EDP  CHIEF COMPLAINT:  Cardiac Arrest  HISTORY OF PRESENT ILLNESS:  Pt is encephelopathic; therefore, this HPI is obtained from chart review. Eric [name] is a 65 y.o. male with PMH as outlined below including known systolic heart failure and A.fib on Eliquis.  He was in his USOH until evening of 05/08 when he was with his wife at [place].  As they were leaving [employer], he began driving the car and while still in the parking lot, he began to breath very heavily before becoming unresponsive.  His wife immediately got the car stopped, got him out of the car and began CPR while calling EMS.  She feels that EMS arrived within 5 - 10 minutes and upon their arrival, he received 2 defibrillations via AED.  ROSC was achieved within roughly 15 - 20 minutes.  Upon ED arrival, he remained unresponsive and was subsequently intubated.  PCCM was called for admission and consideration of hypothermia protocol.  Cardiology was also called in consultation.  Per his wife, he had not had any complaints recently and had behaved completely normal.  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   SUBJECTIVE:  No acute change.  No further bleeding from ETT.  Responds to voice, follows some commands.  Some agitation overnight.  Failed SBT r/t tachycardia, tachypnea.    VITAL SIGNS: BP 147/72 mmHg  Pulse 88  Temp(Src) 99.5 F (37.5 C) (Oral)  Resp 18  Ht 5\' 9"  (1.753 m)  Wt 113 kg (249 lb 1.9 oz)  BMI 36.77 kg/m2  SpO2 100%  HEMODYNAMICS: CVP:  [5 mmHg-11 mmHg] 7 mmHg  VENTILATOR SETTINGS: Vent Mode:  [-] PRVC FiO2 (%):  [40 %] 40 % Set Rate:  [18 bmp] 18 bmp Vt Set:  [600 mL] 600 mL PEEP:  [5 cmH20] 5 cmH20 Plateau Pressure:  [19 cmH20-23 cmH20] 20 cmH20  INTAKE / OUTPUT: I/O last 3  completed shifts: In: 3468.9 [I.V.:1299.9; NG/GT:1850; IV [id number] Out: 2240 [Urine:2115; Emesis/NG output:125]   PHYSICAL EXAMINATION: General: Adult male, in NAD. Neuro: RASS -1, opens eyes to voice, follows simple commands, agitated overnight   HEENT: /AT, pupils 2 mm reactive, sclerae anicteric. Cardiovascular: NSR, s1s2, no M/R/G.  Lungs: Respirations even and unlabored on vent.  Diminished bases otherwise clear  Abdomen: BS x 4, soft, NT/ND.  Musculoskeletal: No gross deformities, no edema, moves all extremities  Skin: Intact, warm, no rashes.  LABS:  BMET  Recent Labs Lab 02/03/16 0340 02/04/16 0346 02/05/16 0653  NA 143 140 143  K 4.8 4.9 4.2  CL 113* 112* 113*  CO2 17* 18* 21*  BUN 27* 44* 48*  CREATININE 2.38* 2.31* 2.17*  GLUCOSE 82 111* 139*    Electrolytes  Recent Labs Lab 02/01/16 0450  02/02/16 0200  02/03/16 0340 02/04/16 0346 02/05/16 0653  CALCIUM 8.6*  < > 9.2  < > 8.6* 8.5* 8.9  MG 1.8  --  2.1  --  2.2  --   --   PHOS 1.6*  --  3.0  --   --   --   --   < > = values in this interval not displayed.  CBC  Recent Labs Lab 02/03/16 0340 02/04/16 0346 02/05/16 0653  WBC 13.0* 10.5 8.1  HGB 12.7* 12.6* 11.3*  HCT 39.1 39.3 34.9*  PLT 212 197 184    Coag's  Recent Labs Lab 02/01/16 0105 02/01/16 0653  02/01/16 1115  02/02/16 2149 02/03/16 0340 02/04/16 0346  APTT 25 42*  < >  --   < > 74* 85* 83*  INR 1.29 1.51*  --  1.50*  --   --   --   --   < > = values in this interval not displayed.  Sepsis Markers  Recent Labs Lab 02/01/16 0200 02/01/16 0450 02/03/16 1135 02/04/16 0346  LATICACIDVEN 1.2 1.4  --   --   PROCALCITON  --   --  1.52 1.77    ABG  Recent Labs Lab 01/31/16 2308 02/03/16 0928  PHART 7.343* 7.312*  PCO2ART 43.2 37.3  PO2ART 286.0* 91.0    Liver Enzymes  Recent Labs Lab 01/31/16 2206 02/02/16 0350  AST 140* 75*  ALT 84* 69*  ALKPHOS 51 45  BILITOT 1.2 0.9  ALBUMIN 3.6 3.5     Cardiac Enzymes  Recent Labs Lab 02/01/16 0653 02/01/16 1215 02/01/16 1759  TROPONINI 0.11* 0.08* 0.06*    Glucose  Recent Labs Lab 02/03/16 2317 02/04/16 0350 02/04/16 0813 02/04/16 1152 02/04/16 1638 02/05/16 0004  GLUCAP 84 101* 82 73 104* 126*    Imaging Dg Chest Port 1 View  02/05/2016  CLINICAL DATA:  Acute respiratory failure EXAM: PORTABLE CHEST 1 VIEW COMPARISON:  Feb 04, 2016 FINDINGS: The support apparatus is in stable position including the ETT. No pneumothorax. A transcutaneous pacer overlies and partially obscures the left chest. Minimal edema and stable atelectasis in the left base. Stable cardiomegaly. No other change. IMPRESSION: Stable support apparatus, cardiomegaly, minimal edema, and left basilar atelectasis. Electronically Signed   By: Dorise Bullion III M.D   On: 02/05/2016 07:30     STUDIES:  CXR 05/08 >>no acute process. CT head 05/08 >>no acute process Echo 5/9>>EF 30 to 35%, severe aortic regurgitation EEG 5/9>>no focal, hemispheric, or lateralizing features; demonstrated a burst/suppression pattern, which can be consistent with a sedated EEG 5/11>>mod diffuse slowing, No epileptiform activity was recorded  CULTURES: Blood 05/08 >>no growth 5/11>> Urine 05/08 >>no growth 5/10 Sputum 05/08 >> MRSA PCR 5/9>>Negative BCx2 5/12>>>  ANTIBIOTICS: Zosyn 5/11>> Vancomycin 5/11>>  SIGNIFICANT EVENTS: 05/08 - admitted after cardiac arrest.  Started on TTM - 33 degrees C. 5/10-pt rewarmed per hypothermia protocol  LINES/TUBES: ETT 05/08 > R radial a line 05/08 > Right IJ CVL 05/08 >  DISCUSSION: 65 y.o. M with known sCHF and A.fib, presented 05/08 after out of hospital cardiac arrest.  ROSC within 15-20 minutes after defib x 2.  Started on hypothermia protocol, 33 degrees celsius 5/8 rewarmed 5/10  ASSESSMENT / PLAN:  CARDIOVASCULAR A:  Cardiac arrest - VF got shocked, r/o ischemia Aortic aneurysm Hx HTN, A.fib on Eliquis, sCHF  (EF 40-45% per echo from April 2017) P:  Lasix 40mg  IV x 1 5/13 Goal MAP > 65 Heparin gtt stopped 5/12 r/t hemoptysis - continue to monitor  Cards following, appreciate the assistance 4.9 cm aortic aneurysm -- will need f/u CT of chest once extubated and Cr improved  Planned cath per cards when more stable  Continue outpatient atorvastatin Hold outpatient carvedilol, eliquis, furosemide, bidil, spironolactone  NEUROLOGIC A:   Acute encephalopathy - concern for anoxia given exam findings Hx migraines, chronic lower back pain. P:   RASS goal -1 D/c fenantly and change to precedex 5/13 to facilitate weaning WUA daily  SBT's daily Neuro  consult appreciate input  PULMONARY PNA - suspect aspiration  Mild pulmonary edema Acute hypoxic respiratory failure - in the setting of cardiac arrest P:   Continue abx as above  Diuresis 5/13 Full vent support-PRVC 18, 600, 5, 40% Daily SBT  VAP prevention measures CXR in AM   RENAL A:   AoCKD - improving slowly  Hyperkalemia-resolved P:   F/u chem   GASTROINTESTINAL A:   GI prophylaxis. Nutrition. P:  SUP: Pantoprazole. Tube feedings   HEMATOLOGIC A:   VTE Prophylaxis. P:  SCD's  Heparin drip-may need to consider d/c if continued s/sx of bleeding Trend CBC's and Coags Monitor for s/sx of bleeding  INFECTIOUS Aspiration Pneumonia P:   Cont broad spectrum abx with ongoing low grade fevers, purulent sputum  Continue antibiotics as listed above F/u repeat cultures from 5/12 Trend WBC's, procalcitonins and monitor fever curve  ENDOCRINE A:  Hypoglycemia Hypothyroidism. P:   Continue outpatient synthroid SSI    Family updated: No family available 5/13   Nickolas Madrid, NP 02/05/2016  8:23 AM Pager: [phone] or [phone]

## 2016-02-05 NOTE — Progress Notes (Signed)
eLink Physician-Brief Progress Note Patient Name: Eric [name] DOB: [birth year] MRN: [id number]   Date of Service  02/05/2016  HPI/Events of Note  Remains agitated.  eICU Interventions  Will add prn versed.     Intervention Category Major Interventions: Other:  Cynia Abruzzo 02/05/2016, 9:07 PM

## 2016-02-05 NOTE — Progress Notes (Addendum)
After repositioning patient and performing mouth care, patient became increasingly agitated, flailing arms and trying to cough out ET tube.  Patient then went into a rapid rhythm HR in 190-210's, SBP in 200's with pink, frothy secretions in ET tube. CCM and cardiology notified.  Amiodarone bolus given and gtt started.  Will continue to monitor closely. Eric [name], Ardeth Sportsman

## 2016-02-05 NOTE — Progress Notes (Signed)
Patient placed back on full vent support due to increased agitation.

## 2016-02-05 NOTE — Progress Notes (Signed)
Patient Name: Eric [name] Date of Encounter: 02/05/2016  Primary Cardiologist: Dr. Tamala Julian   Patient profile: 65 yo male with h/o of NICM, afib on eliquis, LBBB presented with cardiac arrest while talking to his wife in the car. CPR started 1-2 min later. Upon EMS arrival, he received AED shock x 2 and achieved ROSC.    Principal Problem:   Cardiac arrest [facility]) Active Problems:   Chronic systolic heart failure (HCC)   Hyperlipidemia   Left bundle branch block   Obesity (BMI 30-39.9)   Atrial flutter ([place])   Acute respiratory failure ([place])   Encounter for central line placement   Arrhythmia   Altered mental status    SUBJECTIVE  Intubated. Warmed. Did not wake up for me this morning.   CURRENT MEDS . antiseptic oral rinse  7 mL Mouth Rinse 10 times per day  . atorvastatin  20 mg Per Tube q1800  . chlorhexidine gluconate (SAGE KIT)  15 mL Mouth Rinse BID  . feeding supplement (PRO-STAT SUGAR FREE 64)  30 mL Oral Daily  . feeding supplement (VITAL HIGH PROTEIN)  1,000 mL Per Tube Q24H  . insulin aspart  2-6 Units Subcutaneous Q4H  . levothyroxine  75 mcg Per Tube QAC breakfast  . midazolam  2 mg Intravenous Once  . pantoprazole sodium  40 mg Per Tube Daily  . piperacillin-tazobactam (ZOSYN)  IV  3.375 g Intravenous Q8H  . sodium chloride flush  10-40 mL Intracatheter Q12H  . vancomycin  1,250 mg Intravenous Q24H    OBJECTIVE  Filed Vitals:   02/05/16 0337 02/05/16 0400 02/05/16 0500 02/05/16 0600  BP:  136/73 123/74 130/71  Pulse:  87 86 83  Temp:  99.9 F (37.7 C) 99.9 F (37.7 C) 99.5 F (37.5 C)  TempSrc:  Oral    Resp:  '17 18 20  ' Height:      Weight:      SpO2: 100% 100% 100% 100%    Intake/Output Summary (Last 24 hours) at 02/05/16 0740 Last data filed at 02/05/16 0600  Gross per 24 hour  Intake 2128.44 ml  Output   1560 ml  Net 568.44 ml   Filed Weights   02/03/16 0400 02/04/16 0300 02/05/16 0200  Weight: 246 lb 15 oz (112.01 kg) 249 lb 1.9 oz  (113 kg) 249 lb 1.9 oz (113 kg)    PHYSICAL EXAM  General: Intubated, did not wake up this morning Neuro: unable to assess HEENT:  Normal. Intubated. Sedation weaning off  Neck: supple Lungs:  On ventilator. Intubated. Anterior exam CTA.  Heart: RRR no s3, s4, or murmurs. Difficult to hear aortic murmur Abdomen: Soft, non-distended, BS + x 4.  Extremities: No clubbing, cyanosis or edema.  Accessory Clinical Findings  CBC  Recent Labs  02/04/16 0346 02/05/16 0653  WBC 10.5 8.1  HGB 12.6* 11.3*  HCT 39.3 34.9*  MCV 96.3 94.8  PLT 197 001   Basic Metabolic Panel  Recent Labs  02/03/16 0340 02/04/16 0346  NA 143 140  K 4.8 4.9  CL 113* 112*  CO2 17* 18*  GLUCOSE 82 111*  BUN 27* 44*  CREATININE 2.38* 2.31*  CALCIUM 8.6* 8.5*  MG 2.2  --    Liver Function Tests No results for input(s): AST, ALT, ALKPHOS, BILITOT, PROT, ALBUMIN in the last 72 hours. Cardiac Enzymes No results for input(s): CKTOTAL, CKMB, CKMBINDEX, TROPONINI in the last 72 hours. TELE LBBB with frequently missed beat, HR improved this morning.  ECG  No new EKG  Echocardiogram 02/01/2016  LV EF: 30% - 35%  ------------------------------------------------------------------- Indications: Cardiac arrest 427.5.  ------------------------------------------------------------------- History: PMH: Atrial fibrillation. Atrial flutter. Congestive heart failure.  ------------------------------------------------------------------- Study Conclusions  - Left ventricle: The cavity size was severely dilated. There was  mild focal basal hypertrophy of the septum. Systolic function was  moderately to severely reduced. The estimated ejection fraction  was in the range of 30% to 35%. Diffuse hypokinesis. Features are  consistent with a pseudonormal left ventricular filling pattern,  with concomitant abnormal relaxation and increased filling  pressure (grade 2 diastolic  dysfunction). Doppler parameters are  consistent with elevated ventricular end-diastolic filling  pressure. - Ventricular septum: Septal motion showed abnormal function and  dyssynergy. - Aortic valve: There was severe regurgitation. - Aortic root: The aortic root was dilated measurinh 49 mm. - Ascending aorta: The ascending aorta was dilated measuring 43 mm. - Left atrium: The atrium was moderately dilated. - Right ventricle: The cavity size was normal. Wall thickness was  normal. Systolic function was moderately reduced. - Right atrium: The atrium was normal in size. - Tricuspid valve: There was mild regurgitation. - Pulmonic valve: There was moderate regurgitation. - Pulmonary arteries: Systolic pressure was within the normal  range. PA peak pressure: 32 mm Hg (S). - Inferior vena cava: The vessel was normal in size. - Pericardium, extracardiac: There was no pericardial effusion.  There was a left pleural effusion.  Impressions:  - Dilated aortc root, with bicuspid aortic valve and severe aortic  regurgitation with eccentric jet. Mildly dilated ascending aorta.    Radiology/Studies  Ct Head Wo Contrast  02/01/2016  CLINICAL DATA:  Cardiac arrest EXAM: CT HEAD WITHOUT CONTRAST TECHNIQUE: Contiguous axial images were obtained from the base of the skull through the vertex without intravenous contrast. COMPARISON:  None. FINDINGS: There is no intracranial hemorrhage, mass or evidence of acute infarction. There is no extra-axial fluid collection. Gray matter and white matter appear normal. Cerebral volume is normal for age. Brainstem and posterior fossa are unremarkable. The CSF spaces appear normal. The bony structures are intact. The visible portions of the paranasal sinuses are clear. The orbits are unremarkable. IMPRESSION: Normal brain Electronically Signed   By: Andreas Newport M.D.   On: 02/01/2016 00:49   Dg Chest Port 1 View  02/05/2016  CLINICAL DATA:  Acute  respiratory failure EXAM: PORTABLE CHEST 1 VIEW COMPARISON:  Feb 04, 2016 FINDINGS: The support apparatus is in stable position including the ETT. No pneumothorax. A transcutaneous pacer overlies and partially obscures the left chest. Minimal edema and stable atelectasis in the left base. Stable cardiomegaly. No other change. IMPRESSION: Stable support apparatus, cardiomegaly, minimal edema, and left basilar atelectasis. Electronically Signed   By: Dorise Bullion III M.D   On: 02/05/2016 07:30   Dg Chest Port 1 View  02/04/2016  CLINICAL DATA:  Cardiac arrest, acute respiratory failure, chronic CHF. EXAM: PORTABLE CHEST 1 VIEW COMPARISON:  Portable chest x-ray of Feb 03, 2016 FINDINGS: The lungs are less well inflated today. Mild interstitial prominence on the left persists. The left hemidiaphragm remains partially obscured. External pacemaker defibrillator pad on the left is again demonstrated. The cardiac silhouette remains enlarged. The pulmonary vascularity is mildly prominent centrally but slightly less engorged overall. The endotracheal tube tip lies approximately 4.5 cm above the carina. The esophagogastric tube tip projects below the inferior margin of the image. The left internal jugular venous catheter tip projects over the midportion of the SVC.  IMPRESSION: Stable appearance of the chest allowing for mild hypoinflation. Stable mild CHF. Slight improvement in left lower lobe atelectasis. The support tubes are in stable position. Electronically Signed   By: David  Martinique M.D.   On: 02/04/2016 07:29   Dg Chest Port 1 View  02/03/2016  CLINICAL DATA:  Acute respiratory failure EXAM: PORTABLE CHEST 1 VIEW COMPARISON:  02/02/2016 FINDINGS: Cardiac shadow remains enlarged. Left jugular catheter, nasogastric catheter and endotracheal tube are again seen and stable. Increased density in the left retrocardiac region is noted consistent with increasing lower lobe atelectasis/consolidation. Mild right basilar  atelectasis is noted as well. No bony abnormality is seen. IMPRESSION: Increasing left basilar consolidation. New right basilar atelectasis. Electronically Signed   By: Inez Catalina M.D.   On: 02/03/2016 07:06   Dg Chest Port 1 View  02/02/2016  CLINICAL DATA:  Cardiac arrest, intubated patient, acute respiratory failure EXAM: PORTABLE CHEST 1 VIEW COMPARISON:  Portable chest x-ray of Feb 01, 2016 FINDINGS: The lungs are adequately inflated. Subsegmental atelectasis at the left lung base is present. The cardiac silhouette is enlarged. The pulmonary vascularity is less engorged today. External pacemaker defibrillator pads remain present. There is no pleural effusion or pneumothorax. The endotracheal tube tip lies 6 cm above the carina. The esophagogastric tube tip projects below the inferior margin of the image. The left internal jugular venous catheter tip projects over the midportion of the SVC. IMPRESSION: Improved appearance of the thorax with decreasing pulmonary interstitial edema. Minimal left lower lobe subsegmental atelectasis is present. The support tubes are in stable position. Electronically Signed   By: David  Martinique M.D.   On: 02/02/2016 07:22   Dg Chest Port 1 View  02/01/2016  CLINICAL DATA:  Central line placement EXAM: PORTABLE CHEST 1 VIEW COMPARISON:  01/31/2016 FINDINGS: The endotracheal tube tip is 3.2 cm above the carina. There is a new left jugular central line which extends to the expected location of the SVC 1.5 cm below the azygos vein junction. The nasogastric tube extends into the stomach and beyond the inferior edge of the image. A percutaneous pacing patch superimposes the left hemi thorax. Airspace opacities are present in the left central lung. This could represent pneumonia, hemorrhage, aspiration. Right lung is clear. No pneumothorax. IMPRESSION: Support equipment appears satisfactorily positioned. Central left lung airspace opacity, appears worsened. Electronically Signed   By:  Andreas Newport M.D.   On: 02/01/2016 02:35   Dg Chest Port 1 View  01/31/2016  CLINICAL DATA:  Respiratory distress for 1 day. EXAM: PORTABLE CHEST 1 VIEW COMPARISON:  10/11/2012 FINDINGS: The endotracheal tube is 4.5 cm above the carina. No consolidation. No large effusions. No pneumothorax. IMPRESSION: Satisfactorily positioned ETT. Electronically Signed   By: Andreas Newport M.D.   On: 01/31/2016 22:25    ASSESSMENT AND PLAN  1. Cardiac arrest s/p 2 AED shock:   - unclear etiology, AED indicated shockable rhythm but he has history of flutter/fib and LBBB    Still sedated, on the vent, Became aggitated when he woke up Immediate plans per PCCM - given cardiac arrest, drop in EF, and severe AR and bicuspid AV on echo and sudden onset of SOB prior to cardiac arrest, will need ischemic workup likely left and right heart cath once neurological condition improves  2. PAF:  - CHA2DS2-Vasc score 2 (HTN, HF) Was on Eliquis but had hemoptysis and blood around foley. Eliquis is currently on hold   3. Chronic LBBB  4. Aortic aneurysm: 4.9  cm CXR does not support acute dissection but should have CT after extubated and Cr improved  5. NICM/Chronic systolic HF: EF was 47-15% on echo 03/22/2012, EF 40-45% on echo 01/19/2016  - Echo 02/01/2016 EF 30-35%, diffuse hypokinesis, grade 2 DD, severe AR, dilated aortic root 41m, mild TR, moderate PR, PA peak pressure 359mg, no pericardial effusion  6. HTN: BP improving after stopping sedation, will hold off restart coreg at this time given frequent dropped beats and bradycardia on telemetry  7. Severe AR: worsened on echo 02/01/2016 compare to previous echo in 01/19/2016, unclear cause for worsening after only 2 weeks  8. Acute on CKD stage III, baseline Cr appears to be 1.5, trended up to 2.3, now trending down  - Cr appears to have plateaued, trending down now      PhMertie MooresMD  02/05/2016 7:46 AM    [id number] [place]  [id number] [phone]: [phone]: [phone]

## 2016-02-06 ENCOUNTER — Inpatient Hospital Stay (HOSPITAL_COMMUNITY): Payer: BC Managed Care – PPO

## 2016-02-06 DIAGNOSIS — N179 Acute kidney failure, unspecified: Secondary | ICD-10-CM

## 2016-02-06 DIAGNOSIS — J9600 Acute respiratory failure, unspecified whether with hypoxia or hypercapnia: Secondary | ICD-10-CM | POA: Diagnosis not present

## 2016-02-06 DIAGNOSIS — J189 Pneumonia, unspecified organism: Secondary | ICD-10-CM | POA: Diagnosis not present

## 2016-02-06 DIAGNOSIS — I4892 Unspecified atrial flutter: Secondary | ICD-10-CM | POA: Diagnosis not present

## 2016-02-06 DIAGNOSIS — I5022 Chronic systolic (congestive) heart failure: Secondary | ICD-10-CM | POA: Diagnosis not present

## 2016-02-06 DIAGNOSIS — I469 Cardiac arrest, cause unspecified: Secondary | ICD-10-CM | POA: Diagnosis not present

## 2016-02-06 LAB — GLUCOSE, CAPILLARY
GLUCOSE-CAPILLARY: 114 mg/dL — AB (ref 65–99)
GLUCOSE-CAPILLARY: 132 mg/dL — AB (ref 65–99)
GLUCOSE-CAPILLARY: 139 mg/dL — AB (ref 65–99)
GLUCOSE-CAPILLARY: 185 mg/dL — AB (ref 65–99)
Glucose-Capillary: 162 mg/dL — ABNORMAL HIGH (ref 65–99)
Glucose-Capillary: 169 mg/dL — ABNORMAL HIGH (ref 65–99)

## 2016-02-06 LAB — CULTURE, BLOOD (ROUTINE X 2)
Culture: NO GROWTH
Culture: NO GROWTH

## 2016-02-06 LAB — CBC
HCT: 36.8 % — ABNORMAL LOW (ref 39.0–52.0)
HEMOGLOBIN: 12.4 g/dL — AB (ref 13.0–17.0)
MCH: 31.7 pg (ref 26.0–34.0)
MCHC: 33.7 g/dL (ref 30.0–36.0)
MCV: 94.1 fL (ref 78.0–100.0)
PLATELETS: 215 10*3/uL (ref 150–400)
RBC: 3.91 MIL/uL — AB (ref 4.22–5.81)
RDW: 14.4 % (ref 11.5–15.5)
WBC: 9 10*3/uL (ref 4.0–10.5)

## 2016-02-06 LAB — BASIC METABOLIC PANEL
Anion gap: 10 (ref 5–15)
BUN: 51 mg/dL — AB (ref 6–20)
CO2: 22 mmol/L (ref 22–32)
CREATININE: 2.06 mg/dL — AB (ref 0.61–1.24)
Calcium: 9.1 mg/dL (ref 8.9–10.3)
Chloride: 110 mmol/L (ref 101–111)
GFR, EST AFRICAN AMERICAN: 38 mL/min — AB (ref >60)
GFR, EST NON AFRICAN AMERICAN: 32 mL/min — AB (ref >60)
Glucose, Bld: 150 mg/dL — ABNORMAL HIGH (ref 65–99)
Potassium: 4.3 mmol/L (ref 3.5–5.1)
SODIUM: 142 mmol/L (ref 135–145)

## 2016-02-06 LAB — HEPARIN LEVEL (UNFRACTIONATED): HEPARIN UNFRACTIONATED: 0.92 [IU]/mL — AB (ref 0.30–0.70)

## 2016-02-06 LAB — APTT: APTT: 85 s — AB (ref 24–37)

## 2016-02-06 MED ORDER — FUROSEMIDE 10 MG/ML IJ SOLN
40.0000 mg | Freq: Once | INTRAMUSCULAR | Status: AC
  Administered 2016-02-06: 40 mg via INTRAVENOUS

## 2016-02-06 MED ORDER — LABETALOL HCL 5 MG/ML IV SOLN
10.0000 mg | Freq: Four times a day (QID) | INTRAVENOUS | Status: DC | PRN
  Administered 2016-02-06 – 2016-02-07 (×2): 10 mg via INTRAVENOUS
  Filled 2016-02-06 (×2): qty 4

## 2016-02-06 MED ORDER — FUROSEMIDE 10 MG/ML IJ SOLN
INTRAMUSCULAR | Status: AC
  Filled 2016-02-06: qty 4

## 2016-02-06 MED ORDER — HEPARIN BOLUS VIA INFUSION
4000.0000 [IU] | Freq: Once | INTRAVENOUS | Status: AC
  Administered 2016-02-06: 4000 [IU] via INTRAVENOUS
  Filled 2016-02-06: qty 4000

## 2016-02-06 MED ORDER — HYDRALAZINE HCL 20 MG/ML IJ SOLN
10.0000 mg | INTRAMUSCULAR | Status: DC | PRN
  Administered 2016-02-06: 10 mg via INTRAVENOUS
  Filled 2016-02-06: qty 1

## 2016-02-06 MED ORDER — CARVEDILOL 12.5 MG PO TABS
12.5000 mg | ORAL_TABLET | Freq: Two times a day (BID) | ORAL | Status: DC
  Administered 2016-02-07 (×2): 12.5 mg via ORAL
  Filled 2016-02-06 (×3): qty 1

## 2016-02-06 MED ORDER — CARVEDILOL 6.25 MG PO TABS
6.2500 mg | ORAL_TABLET | Freq: Two times a day (BID) | ORAL | Status: DC
  Administered 2016-02-06: 6.25 mg via ORAL
  Filled 2016-02-06: qty 1

## 2016-02-06 MED ORDER — HEPARIN (PORCINE) IN NACL 100-0.45 UNIT/ML-% IJ SOLN
1000.0000 [IU]/h | INTRAMUSCULAR | Status: DC
  Administered 2016-02-06 – 2016-02-07 (×2): 1400 [IU]/h via INTRAVENOUS
  Administered 2016-02-07: 1000 [IU]/h via INTRAVENOUS
  Filled 2016-02-06 (×3): qty 250

## 2016-02-06 MED ORDER — POLYETHYLENE GLYCOL 3350 17 G PO PACK
17.0000 g | PACK | Freq: Every day | ORAL | Status: DC
  Administered 2016-02-06 – 2016-02-18 (×4): 17 g via ORAL
  Filled 2016-02-06 (×7): qty 1

## 2016-02-06 MED ORDER — BISACODYL 5 MG PO TBEC
5.0000 mg | DELAYED_RELEASE_TABLET | Freq: Every day | ORAL | Status: DC | PRN
  Filled 2016-02-06: qty 1

## 2016-02-06 MED ORDER — HYDRALAZINE HCL 20 MG/ML IJ SOLN
10.0000 mg | INTRAMUSCULAR | Status: DC | PRN
  Administered 2016-02-06 – 2016-02-17 (×7): 20 mg via INTRAVENOUS
  Filled 2016-02-06 (×7): qty 1

## 2016-02-06 MED ORDER — BISACODYL 10 MG RE SUPP: 10.0000 mg | Freq: Every day | RECTAL | Status: DC | PRN

## 2016-02-06 MED ORDER — DOCUSATE SODIUM 100 MG PO CAPS
100.0000 mg | ORAL_CAPSULE | Freq: Two times a day (BID) | ORAL | Status: DC
  Administered 2016-02-07 – 2016-02-22 (×27): 100 mg via ORAL
  Filled 2016-02-06 (×27): qty 1

## 2016-02-06 MED ORDER — MORPHINE SULFATE (PF) 2 MG/ML IV SOLN
1.0000 mg | INTRAVENOUS | Status: DC | PRN
  Administered 2016-02-07 – 2016-02-14 (×15): 2 mg via INTRAVENOUS
  Filled 2016-02-06 (×15): qty 1

## 2016-02-06 MED ORDER — CETYLPYRIDINIUM CHLORIDE 0.05 % MT LIQD
7.0000 mL | Freq: Two times a day (BID) | OROMUCOSAL | Status: DC
  Administered 2016-02-06 – 2016-02-09 (×7): 7 mL via OROMUCOSAL

## 2016-02-06 NOTE — Progress Notes (Signed)
[facility] notified of extubation.

## 2016-02-06 NOTE — Progress Notes (Signed)
ANTICOAGULATION CONSULT NOTE - Initial Consult  Pharmacy Consult for Heparin Indication: atrial fibrillation  Allergies  Allergen Reactions  . Lactose Intolerance (Gi) Diarrhea    Patient Measurements: Height: 5\' 9"  (175.3 cm) Weight: 245 lb 9.5 oz (111.4 kg) IBW/kg (Calculated) : 70.7 Heparin Dosing Weight: 95kg   Vital Signs: Temp: 101.5 F (38.6 C) (05/14 0400) BP: 174/83 mmHg (05/14 0717) Pulse Rate: 69 (05/14 0717)  Labs:  Recent Labs  02/04/16 0345  02/04/16 0346 02/05/16 0653 02/05/16 1522 02/06/16 0509  HGB  --   < > 12.6* 11.3* 13.0 12.4*  HCT  --   < > 39.3 34.9* 40.0 36.8*  PLT  --   < > 197 184 229 215  APTT  --   --  83*  --   --   --   HEPARINUNFRC 0.87*  --   --   --   --   --   CREATININE  --   < > 2.31* 2.17* 2.31* 2.06*  TROPONINI  --   --   --   --  0.08*  --   < > = values in this interval not displayed.  Estimated Creatinine Clearance: 44.6 mL/min (by C-G formula based on Cr of 2.06).   Medical History: Past Medical History  Diagnosis Date  . Claustrophobia   . Heart murmur   . Hypertension   . Varicose vein of leg     right  . Dysrhythmia     "palpitations"  . Migraine 02/13/12    "opthalmic"  . Chronic lower back pain   . Exertional dyspnea 01/2012  . CHF (congestive heart failure) ([place])   . Hypothyroidism   . Chronic kidney disease     kidney fx studies increased     Assessment: Eliquis PTA for Afib. Transitioned to heparin gtt while inpatient>>gtt stopped on 5/12 due to hemoptysis>>now restarting. Hgb down to 12.4, plts wnl.  Eliquis dose 5mg  BID (last dose 5/8)  Goal of Therapy:  Heparin level 0.3-0.7 units/ml Monitor platelets by anticoagulation protocol: Yes   Plan:  -Heparin 4,000 unit bolus  -Heparin 1400 units/hr  -6hr HL  -Daily HL/CBC -aPTT x1 to assess correlation   Laakea Pereira C. Lennox Grumbles, PharmD Pharmacy Resident  Pager: [phone] 02/06/2016 8:40 AM

## 2016-02-06 NOTE — Progress Notes (Signed)
Pharmacy Antibiotic Note  Eric [name] is a 65 y.o. male admitted on 01/31/2016 with pneumonia.  Pharmacy has been consulted for vancomycin/zosyn dosing. Day #4 of abx for PNA. 5/11 Cxr LLL consolidation/atelectisis. Afebrile, WBC wnl  Plan: -Continue Zosyn 3.375 gm IV q8h (4 hour infusion)  -Continue vancomycin 1,250mg  IV q24h  -Monitor clinical picture, renal function, VT prn  -F/U C&S, abx deescalation / LOT   Height: 5\' 9"  (175.3 cm) Weight: 245 lb 9.5 oz (111.4 kg) IBW/kg (Calculated) : 70.7  Temp (24hrs), Avg:100 F (37.8 C), Min:98.8 F (37.1 C), Max:101.5 F (38.6 C)   Recent Labs Lab 02/01/16 0200  02/01/16 0450  02/03/16 0340 02/04/16 0346 02/05/16 0653 02/05/16 1522 02/06/16 0509  WBC  --   --  7.9  < > 13.0* 10.5 8.1 11.5* 9.0  CREATININE 1.79*  < > 1.64*  < > 2.38* 2.31* 2.17* 2.31* 2.06*  LATICACIDVEN 1.2  --  1.4  --   --   --   --   --   --   < > = values in this interval not displayed.  Estimated Creatinine Clearance: 44.6 mL/min (by C-G formula based on Cr of 2.06).    Allergies  Allergen Reactions  . Lactose Intolerance (Gi) Diarrhea    Antimicrobials this admission: 5/11 zosyn >>  5/11 vancomycin >>    Microbiology results: 5/12 resp cx >normal resp flora  5/9 blood x2 > ngtd  5/8 urine > neg  5/9 MRSA PCR negative  5/12 blood x2 > ngtd  Thank you for allowing pharmacy to be a part of this patient's care.  Tametria Aho C. Lennox Grumbles, PharmD Pharmacy Resident  Pager: [phone] 02/06/2016 8:45 AM

## 2016-02-06 NOTE — Procedures (Signed)
A-line and dressing changed without complications.

## 2016-02-06 NOTE — Progress Notes (Addendum)
Patient Name: Eric [name] Date of Encounter: 02/06/2016  Primary Cardiologist: Dr. Tamala Julian   Patient profile: 65 yo male with h/o of NICM, afib on eliquis, LBBB presented with cardiac arrest while talking to his wife in the car. CPR started 1-2 min later. Upon EMS arrival, he received AED shock x 2 and achieved ROSC.    Principal Problem:   Cardiac arrest [facility]) Active Problems:   Chronic systolic heart failure (HCC)   Hyperlipidemia   Left bundle branch block   Obesity (BMI 30-39.9)   Atrial flutter ([place])   Acute respiratory failure ([place])   Encounter for central line placement   Arrhythmia   Altered mental status    SUBJECTIVE  Intubated. Warmed.  Is following commnds Had a 90 second run of very fast WCT -  Responded to IV amio bolus - now is back on IV amio.   CURRENT MEDS . antiseptic oral rinse  7 mL Mouth Rinse 10 times per day  . atorvastatin  20 mg Per Tube q1800  . chlorhexidine gluconate (SAGE KIT)  15 mL Mouth Rinse BID  . feeding supplement (PRO-STAT SUGAR FREE 64)  30 mL Oral Daily  . feeding supplement (VITAL HIGH PROTEIN)  1,000 mL Per Tube Q24H  . insulin aspart  2-6 Units Subcutaneous Q4H  . levothyroxine  75 mcg Per Tube QAC breakfast  . pantoprazole sodium  40 mg Per Tube Daily  . piperacillin-tazobactam (ZOSYN)  IV  3.375 g Intravenous Q8H  . sodium chloride flush  10-40 mL Intracatheter Q12H  . vancomycin  1,250 mg Intravenous Q24H    OBJECTIVE  Filed Vitals:   02/06/16 0400 02/06/16 0428 02/06/16 0503 02/06/16 0717  BP:   188/89 174/83  Pulse: 80   69  Temp: 101.5 F (38.6 C)     TempSrc:      Resp: 20   19  Height:      Weight:  245 lb 9.5 oz (111.4 kg)    SpO2: 100%   100%    Intake/Output Summary (Last 24 hours) at 02/06/16 0753 Last data filed at 02/06/16 0602  Gross per 24 hour  Intake 3037.47 ml  Output   4170 ml  Net -1132.53 ml   Filed Weights   02/04/16 0300 02/05/16 0200 02/06/16 0428  Weight: 249 lb 1.9 oz (113 kg)  249 lb 1.9 oz (113 kg) 245 lb 9.5 oz (111.4 kg)    PHYSICAL EXAM  General: Intubated, following some commands Neuro:  Follows commands  HEENT:  Normal. Intubated. Sedation weaning off  Neck: supple Lungs:  On ventilator. Intubated. Anterior exam CTA.  Heart: RRR no s3, s4, or murmurs. Difficult to hear aortic murmur Abdomen: Soft, non-distended, BS + x 4.  Extremities: No clubbing, cyanosis or edema.  Accessory Clinical Findings  CBC  Recent Labs  02/05/16 1522 02/06/16 0509  WBC 11.5* 9.0  HGB 13.0 12.4*  HCT 40.0 36.8*  MCV 95.7 94.1  PLT 229 625   Basic Metabolic Panel  Recent Labs  02/05/16 1522 02/06/16 0509  NA 143 142  K 4.9 4.3  CL 112* 110  CO2 20* 22  GLUCOSE 185* 150*  BUN 52* 51*  CREATININE 2.31* 2.06*  CALCIUM 9.5 9.1  MG 2.4  --   PHOS 3.2  --    Liver Function Tests No results for input(s): AST, ALT, ALKPHOS, BILITOT, PROT, ALBUMIN in the last 72 hours. Cardiac Enzymes  Recent Labs  02/05/16 1522  TROPONINI 0.08*  TELE LBBB with frequently missed beat, HR improved this morning.     ECG  No new EKG  Echocardiogram 02/01/2016  LV EF: 30% - 35%  ------------------------------------------------------------------- Indications: Cardiac arrest 427.5.  ------------------------------------------------------------------- History: PMH: Atrial fibrillation. Atrial flutter. Congestive heart failure.  ------------------------------------------------------------------- Study Conclusions  - Left ventricle: The cavity size was severely dilated. There was  mild focal basal hypertrophy of the septum. Systolic function was  moderately to severely reduced. The estimated ejection fraction  was in the range of 30% to 35%. Diffuse hypokinesis. Features are  consistent with a pseudonormal left ventricular filling pattern,  with concomitant abnormal relaxation and increased filling  pressure (grade 2 diastolic dysfunction).  Doppler parameters are  consistent with elevated ventricular end-diastolic filling  pressure. - Ventricular septum: Septal motion showed abnormal function and  dyssynergy. - Aortic valve: There was severe regurgitation. - Aortic root: The aortic root was dilated measurinh 49 mm. - Ascending aorta: The ascending aorta was dilated measuring 43 mm. - Left atrium: The atrium was moderately dilated. - Right ventricle: The cavity size was normal. Wall thickness was  normal. Systolic function was moderately reduced. - Right atrium: The atrium was normal in size. - Tricuspid valve: There was mild regurgitation. - Pulmonic valve: There was moderate regurgitation. - Pulmonary arteries: Systolic pressure was within the normal  range. PA peak pressure: 32 mm Hg (S). - Inferior vena cava: The vessel was normal in size. - Pericardium, extracardiac: There was no pericardial effusion.  There was a left pleural effusion.  Impressions:  - Dilated aortc root, with bicuspid aortic valve and severe aortic  regurgitation with eccentric jet. Mildly dilated ascending aorta.    Radiology/Studies    ASSESSMENT AND PLAN  1. Cardiac arrest s/p 2 AED shock:   - unclear etiology, AED indicated shockable rhythm but he has history of flutter/fib and LBBB    Still sedated, on the vent, Became aggitated when he woke up Immediate plans per PCCM - given cardiac arrest, drop in EF, and severe AR and bicuspid AV on echo and sudden onset of SOB prior to cardiac arrest, will need ischemic workup likely left and right heart cath once neurological condition improves Had an episode of WCT last night - it appears to be PAF. Will need EP evaluation   2. PAF:  - CHA2DS2-Vasc score 2 (HTN, HF) Was on Eliquis but had hemoptysis and blood around foley and it was held. Will continue to hold.   Will start heparin - he will likely be cathed in the next several days   He was on coreg 25 bid at home.   Will  restart it at 12.5 mg BID today   3. Chronic LBBB  4. Aortic aneurysm: 4.9 cm CXR does not support acute dissection but should have CT after extubated and Cr improved  5. NICM/Chronic systolic HF: EF was 32-99% on echo 03/22/2012, EF 40-45% on echo 01/19/2016  - Echo 02/01/2016 EF 30-35%, diffuse hypokinesis, grade 2 DD, severe AR, dilated aortic root 4m, mild TR, moderate PR, PA peak pressure 345mg, no pericardial effusion  6. HTN: his BP has gone up over the past several days. Will add coreg 12.5 bid and will increase the frequency of the PRN hydralazine    7. Severe AR: worsened on echo 02/01/2016 compare to previous echo in 01/19/2016, unclear cause for worsening after only 2 weeks  8. Acute on CKD stage III, baseline Cr appears to be 1.5, trended up to 2.3, now trending down  -  Cr appears to have plateaued, trending down now      Mertie Moores, MD  02/06/2016 7:53 AM    [address],  [place],   [id number] Pager [phone] Phone: [phone]; Fax: [phone]

## 2016-02-06 NOTE — Procedures (Signed)
Extubation Procedure Note  Patient Details:   Name: Eric [name] DOB: 11-Jul-1951 MRN: [id number]   Airway Documentation:     Evaluation  O2 sats: stable throughout Complications: No apparent complications Patient did tolerate procedure well. Bilateral Breath Sounds: Diminished, Rhonchi   Yes   Positive cuff leak noted prior to extubation. Pt placed on nasal cannula 4 L with humidity, no stridor noted. [place] notified.  Eric [name] 02/06/2016, 9:01 AM

## 2016-02-06 NOTE — Progress Notes (Signed)
PULMONARY / CRITICAL CARE MEDICINE   Name: Eric [name] MRN: [id number] DOB: 1951-06-19    ADMISSION DATE:  01/31/2016 CONSULTATION DATE:  01/31/16  REFERRING MD:  EDP  CHIEF COMPLAINT:  Cardiac Arrest  HISTORY OF PRESENT ILLNESS:   Eric [name] is a 65 y.o. male with PMH as outlined below including known systolic heart failure and A.fib on Eliquis.  He was in his USOH until evening of 05/08 when he was with his wife at [place].  As they were leaving [employer], he began driving the car and while still in the parking lot, he began to breath very heavily before becoming unresponsive.  His wife immediately got the car stopped, got him out of the car and began CPR while calling EMS.  She feels that EMS arrived within 5 - 10 minutes and upon their arrival, he received 2 defibrillations via AED.  ROSC was achieved within roughly 15 - 20 minutes.  Upon ED arrival, he remained unresponsive and was subsequently intubated.  PCCM was called for admission and consideration of hypothermia protocol.  Cardiology was also called in consultation.  Per his wife, he had not had any complaints recently and had behaved completely normal.  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   SUBJECTIVE:  Wide complex tachycardia overnight (suspect PAF with LBBB), responded to amio, now on amio gtt.  Tol PS 8/5  VITAL SIGNS: BP 174/83 mmHg  Pulse 69  Temp(Src) 101.5 F (38.6 C) (Oral)  Resp 19  Ht 5\' 9"  (1.753 m)  Wt 111.4 kg (245 lb 9.5 oz)  BMI 36.25 kg/m2  SpO2 100%  HEMODYNAMICS: CVP:  [5 mmHg-12 mmHg] 11 mmHg  VENTILATOR SETTINGS: Vent Mode:  [-] CPAP;PSV FiO2 (%):  [40 %] 40 % Set Rate:  [18 bmp] 18 bmp Vt Set:  [600 mL] 600 mL PEEP:  [5 cmH20] 5 cmH20 Pressure Support:  [8 cmH20] 8 cmH20 Plateau Pressure:  [18 cmH20-22 cmH20] 20 cmH20  INTAKE / OUTPUT: I/O last 3 completed shifts: In: 4514.9 [I.V.:1429.9; Other:150; [id number]; IV  Piggyback:500] Out: [id number] Josceline.Ard; Emesis/NG output:125]   PHYSICAL EXAMINATION: General: Adult male, in NAD. Neuro: RASS -0, opens eyes to voice, follows commands, agitated intermittently  HEENT: [place]/AT, pupils 2 mm reactive, sclerae anicteric. Cardiovascular: NSR, s1s2, no M/R/G.  Lungs: Respirations even and unlabored on PS 8/5.  Diminished bases otherwise clear  Abdomen: BS x 4, soft, NT/ND.  Musculoskeletal: No gross deformities, no edema, moves all extremities  Skin: Intact, warm, no rashes.  LABS:  BMET  Recent Labs Lab 02/05/16 0653 02/05/16 1522 02/06/16 0509  NA 143 143 142  K 4.2 4.9 4.3  CL 113* 112* 110  CO2 21* 20* 22  BUN 48* 52* 51*  CREATININE 2.17* 2.31* 2.06*  GLUCOSE 139* 185* 150*    Electrolytes  Recent Labs Lab 02/01/16 0450  02/02/16 0200  02/03/16 0340  02/05/16 0653 02/05/16 1522 02/06/16 0509  CALCIUM 8.6*  < > 9.2  < > 8.6*  < > 8.9 9.5 9.1  MG 1.8  --  2.1  --  2.2  --   --  2.4  --   PHOS 1.6*  --  3.0  --   --   --   --  3.2  --   < > = values in this interval not displayed.  CBC  Recent Labs Lab 02/05/16 0653 02/05/16 1522 02/06/16 0509  WBC 8.1 11.5* 9.0  HGB 11.3*  13.0 12.4*  HCT 34.9* 40.0 36.8*  PLT 184 229 215    Coag's  Recent Labs Lab 02/01/16 0105 02/01/16 0653  02/01/16 1115  02/02/16 2149 02/03/16 0340 02/04/16 0346  APTT 25 42*  < >  --   < > 74* 85* 83*  INR 1.29 1.51*  --  1.50*  --   --   --   --   < > = values in this interval not displayed.  Sepsis Markers  Recent Labs Lab 02/01/16 0200 02/01/16 0450 02/03/16 1135 02/04/16 0346 02/05/16 0653  LATICACIDVEN 1.2 1.4  --   --   --   PROCALCITON  --   --  1.52 1.77 0.98    ABG  Recent Labs Lab 01/31/16 2308 02/03/16 0928  PHART 7.343* 7.312*  PCO2ART 43.2 37.3  PO2ART 286.0* 91.0    Liver Enzymes  Recent Labs Lab 01/31/16 2206 02/02/16 0350  AST 140* 75*  ALT 84* 69*  ALKPHOS 51 45  BILITOT 1.2 0.9  ALBUMIN 3.6 3.5     Cardiac Enzymes  Recent Labs Lab 02/01/16 1215 02/01/16 1759 02/05/16 1522  TROPONINI 0.08* 0.06* 0.08*    Glucose  Recent Labs Lab 02/05/16 1210 02/05/16 1617 02/05/16 1938 02/06/16 0036 02/06/16 0445 02/06/16 0810  GLUCAP 187* 185* 185* 185* 162* 169*    Imaging No results found.   STUDIES:  CXR 05/08 >>no acute process. CT head 05/08 >>no acute process Echo 5/9>>EF 30 to 35%, severe aortic regurgitation EEG 5/9>>no focal, hemispheric, or lateralizing features; demonstrated a burst/suppression pattern, which can be consistent with a sedated EEG 5/11>>mod diffuse slowing, No epileptiform activity was recorded  CULTURES: Blood 05/08 >>no growth 5/11>> Urine 05/08 >>no growth 5/10 Sputum 05/08 >>few GPC, few GNR>>> MRSA PCR 5/9>>Negative BCx2 5/12>>>  ANTIBIOTICS: Zosyn 5/11>> Vancomycin 5/11>>  SIGNIFICANT EVENTS: 05/08 - admitted after cardiac arrest.  Started on TTM - 33 degrees C. 5/10-pt rewarmed per hypothermia protocol  LINES/TUBES: ETT 05/08 > R radial a line 05/08 > Right IJ CVL 05/08 >  DISCUSSION: 65 y.o. M with known sCHF and A.fib, presented 05/08 after out of hospital cardiac arrest.  ROSC within 15-20 minutes after defib x 2.  Started on hypothermia protocol, 33 degrees celsius 5/8 rewarmed 5/10  ASSESSMENT / PLAN:  CARDIOVASCULAR A:  Cardiac arrest - VF got shocked, r/o ischemia Aortic aneurysm Hx HTN, A.fib on Eliquis, sCHF (EF 40-45% per echo from April 2017) P:  Lasix 40mg  IV x 1 5/14 Goal MAP > 65 Resume heparin gtt  Cards following, appreciate the assistance 4.9 cm aortic aneurysm -- will need f/u CT of chest once extubated and Cr improved  Planned cath per cards when more stable  Continue outpatient atorvastatin Hold outpatient carvedilol, eliquis, bidil, spironolactone  NEUROLOGIC A:   Acute encephalopathy - concern for anoxia given exam findings Hx migraines, chronic lower back pain. P:   RASS goal -1 D/c  fenantly and change to precedex 5/13 to facilitate weaning WUA daily  SBT's daily   PULMONARY PNA - suspect aspiration  Mild pulmonary edema Acute hypoxic respiratory failure - in the setting of cardiac arrest P:   Continue abx as above  Diuresis 5/14 Cont PS wean - suspect can extubate today  VAP prevention measures CXR in AM   RENAL A:   AoCKD - improving slowly  Hyperkalemia-resolved P:   F/u chem   GASTROINTESTINAL A:   GI prophylaxis. Nutrition. P:  SUP: Pantoprazole. Tube feedings   HEMATOLOGIC A:  VTE Prophylaxis. P:  SCD's  Heparin gtt  Trend CBC's and Coags Monitor for s/sx of bleeding  INFECTIOUS Aspiration Pneumonia P:   Cont broad spectrum abx with ongoing low grade fevers, GNR and GPC in prelim BC  Continue antibiotics as listed above F/u repeat cultures from 5/12 Trend WBC's, procalcitonins and monitor fever curve  ENDOCRINE A:  Hypoglycemia Hypothyroidism. P:   Continue outpatient synthroid SSI    Family updated: No family available 5/14   Nickolas Madrid, NP 02/06/2016  8:32 AM Pager: [phone] or [phone]

## 2016-02-07 ENCOUNTER — Inpatient Hospital Stay (HOSPITAL_COMMUNITY): Payer: BC Managed Care – PPO

## 2016-02-07 DIAGNOSIS — I4901 Ventricular fibrillation: Secondary | ICD-10-CM | POA: Diagnosis not present

## 2016-02-07 DIAGNOSIS — I469 Cardiac arrest, cause unspecified: Secondary | ICD-10-CM | POA: Diagnosis not present

## 2016-02-07 DIAGNOSIS — I429 Cardiomyopathy, unspecified: Secondary | ICD-10-CM

## 2016-02-07 DIAGNOSIS — I5022 Chronic systolic (congestive) heart failure: Secondary | ICD-10-CM | POA: Diagnosis not present

## 2016-02-07 DIAGNOSIS — I10 Essential (primary) hypertension: Secondary | ICD-10-CM

## 2016-02-07 DIAGNOSIS — E669 Obesity, unspecified: Secondary | ICD-10-CM

## 2016-02-07 DIAGNOSIS — N179 Acute kidney failure, unspecified: Secondary | ICD-10-CM | POA: Diagnosis not present

## 2016-02-07 DIAGNOSIS — I481 Persistent atrial fibrillation: Secondary | ICD-10-CM | POA: Diagnosis not present

## 2016-02-07 DIAGNOSIS — I484 Atypical atrial flutter: Secondary | ICD-10-CM | POA: Diagnosis not present

## 2016-02-07 DIAGNOSIS — I447 Left bundle-branch block, unspecified: Secondary | ICD-10-CM | POA: Diagnosis not present

## 2016-02-07 DIAGNOSIS — E785 Hyperlipidemia, unspecified: Secondary | ICD-10-CM | POA: Diagnosis not present

## 2016-02-07 DIAGNOSIS — J9600 Acute respiratory failure, unspecified whether with hypoxia or hypercapnia: Secondary | ICD-10-CM | POA: Diagnosis not present

## 2016-02-07 LAB — BASIC METABOLIC PANEL
Anion gap: 11 (ref 5–15)
BUN: 50 mg/dL — ABNORMAL HIGH (ref 6–20)
CHLORIDE: 107 mmol/L (ref 101–111)
CO2: 24 mmol/L (ref 22–32)
CREATININE: 2.24 mg/dL — AB (ref 0.61–1.24)
Calcium: 9.1 mg/dL (ref 8.9–10.3)
GFR calc non Af Amer: 29 mL/min — ABNORMAL LOW (ref >60)
GFR, EST AFRICAN AMERICAN: 34 mL/min — AB (ref >60)
GLUCOSE: 148 mg/dL — AB (ref 65–99)
Potassium: 4.1 mmol/L (ref 3.5–5.1)
Sodium: 142 mmol/L (ref 135–145)

## 2016-02-07 LAB — GLUCOSE, CAPILLARY
GLUCOSE-CAPILLARY: 132 mg/dL — AB (ref 65–99)
GLUCOSE-CAPILLARY: 134 mg/dL — AB (ref 65–99)
GLUCOSE-CAPILLARY: 141 mg/dL — AB (ref 65–99)
Glucose-Capillary: 126 mg/dL — ABNORMAL HIGH (ref 65–99)
Glucose-Capillary: 157 mg/dL — ABNORMAL HIGH (ref 65–99)
Glucose-Capillary: 185 mg/dL — ABNORMAL HIGH (ref 65–99)

## 2016-02-07 LAB — HEPARIN LEVEL (UNFRACTIONATED)
HEPARIN UNFRACTIONATED: 0.96 [IU]/mL — AB (ref 0.30–0.70)
Heparin Unfractionated: 0.98 IU/mL — ABNORMAL HIGH (ref 0.30–0.70)
Heparin Unfractionated: 0.96 IU/mL — ABNORMAL HIGH (ref 0.30–0.70)

## 2016-02-07 LAB — CBC
HEMATOCRIT: 41 % (ref 39.0–52.0)
HEMOGLOBIN: 13.8 g/dL (ref 13.0–17.0)
MCH: 31.7 pg (ref 26.0–34.0)
MCHC: 33.7 g/dL (ref 30.0–36.0)
MCV: 94.3 fL (ref 78.0–100.0)
Platelets: 237 10*3/uL (ref 150–400)
RBC: 4.35 MIL/uL (ref 4.22–5.81)
RDW: 14.3 % (ref 11.5–15.5)
WBC: 11.1 10*3/uL — ABNORMAL HIGH (ref 4.0–10.5)

## 2016-02-07 LAB — CULTURE, RESPIRATORY W GRAM STAIN: Special Requests: NORMAL

## 2016-02-07 LAB — APTT: aPTT: 85 seconds — ABNORMAL HIGH (ref 24–37)

## 2016-02-07 MED ORDER — HEPARIN (PORCINE) IN NACL 100-0.45 UNIT/ML-% IJ SOLN
950.0000 [IU]/h | INTRAMUSCULAR | Status: DC
  Administered 2016-02-09: 800 [IU]/h via INTRAVENOUS
  Filled 2016-02-07 (×2): qty 250

## 2016-02-07 MED ORDER — ATORVASTATIN CALCIUM 20 MG PO TABS
20.0000 mg | ORAL_TABLET | Freq: Every day | ORAL | Status: DC
  Administered 2016-02-07 – 2016-02-09 (×3): 20 mg via ORAL
  Filled 2016-02-07 (×3): qty 1

## 2016-02-07 MED ORDER — LEVOTHYROXINE SODIUM 75 MCG PO TABS
75.0000 ug | ORAL_TABLET | Freq: Every day | ORAL | Status: DC
  Administered 2016-02-09 – 2016-02-22 (×13): 75 ug via ORAL
  Filled 2016-02-07 (×14): qty 1

## 2016-02-07 MED ORDER — OXYCODONE-ACETAMINOPHEN 5-325 MG PO TABS
1.0000 | ORAL_TABLET | Freq: Four times a day (QID) | ORAL | Status: DC | PRN
  Administered 2016-02-07 – 2016-02-22 (×12): 1 via ORAL
  Filled 2016-02-07 (×13): qty 1

## 2016-02-07 NOTE — Evaluation (Signed)
Physical Therapy Evaluation Patient Details Name: Eric [name] MRN: [id number] DOB: May 02, 1951 Today's Date: 02/07/2016   History of Present Illness  65 y.o. M with known sCHF and A.fib, presented 05/08 after out of hospital cardiac arrest. ROSC within 15-20 minutes after defib x 2. ETT 5/8-5/14. Now with acute encephalopathy with concern for anoxia per MD notes.   Clinical Impression  Pt admitted with above diagnosis. Pt currently with functional limitations due to the deficits listed below (see PT Problem List). Pt had a lot of difficultywith transfers as he had difficulty with maintaining posture due to delayed response time as well as probably poor motor planning.  Will benefit from Rehab prior to d/c home as pt states wife can assist.   Pt will benefit from skilled PT to increase their independence and safety with mobility to allow discharge to the venue listed below.    Follow Up Recommendations CIR;Supervision/Assistance - 24 hour    Equipment Recommendations  Other (comment) (TBA)    Recommendations for Other Services Rehab consult     Precautions / Restrictions Precautions Precautions: Fall Restrictions Weight Bearing Restrictions: No      Mobility  Bed Mobility Overal bed mobility: Needs Assistance;+2 for physical assistance Bed Mobility: Supine to Sit     Supine to sit: Mod assist;+2 for physical assistance     General bed mobility comments: Pt needed assist for LEs and for elevation of trunk. Took incr time to obtain balance at EOB as well.    Transfers Overall transfer level: Needs assistance Equipment used: 2 person hand held assist Transfers: Sit to/from [facility] Sit to Stand: Mod assist;+2 physical assistance Stand pivot transfers: Mod assist;+2 physical assistance       General transfer comment: Pt had difficulty achieving upright posture with flexed posture much of time.  Pt could right posture to command but could not sustain  posture unless constantly cued.  Pt required max cues at all times for postural stability.  Pt also reaching for armrests and had to have bil UE support at all times. Pt asked to get on 3N! and this PT asssited to 3N1.  Called nursing to assist to cleaning pt as he thought he had a BM however once pt stood, pt did not have BM however nursing did assist with transfer to get pt safely in chair as he again tends to maintain flexed posture and cannot maintain upright posture for any length of time.    Ambulation/Gait                Stairs            Wheelchair Mobility    Modified Rankin (Stroke Patients Only) Modified Rankin (Stroke Patients Only) Pre-Morbid Rankin Score: No symptoms Modified Rankin: Severe disability     Balance Overall balance assessment: Needs assistance;History of Falls Sitting-balance support: No upper extremity supported;Feet supported Sitting balance-Leahy Scale: Poor Sitting balance - Comments: requiring UE support for balance.    Standing balance support: Bilateral upper extremity supported;During functional activity Standing balance-Leahy Scale: Poor Standing balance comment: Pt unable to stand statically without mod assist and cues due to poor ability to maintain posture.                              Pertinent Vitals/Pain Pain Assessment: Faces Faces Pain Scale: Hurts even more Pain Location: back pain Pain Descriptors / Indicators: Aching Pain Intervention(s): Limited activity within patient's tolerance;Monitored  during session;Repositioned;Premedicated before session  VSS    Home Living Family/patient expects to be discharged to:: Private residence Living Arrangements: Spouse/significant other Available Help at Discharge: Family;Available 24 hours/day Type of Home: House Home Access: Stairs to enter Entrance Stairs-Rails: None Entrance Stairs-Number of Steps: 3 Home Layout: One level Home Equipment: None      Prior  Function Level of Independence: Independent         Comments: Pt worked Music therapist.      Hand Dominance   Dominant Hand: Right    Extremity/Trunk Assessment   Upper Extremity Assessment: Defer to OT evaluation           Lower Extremity Assessment: Generalized weakness      Cervical / Trunk Assessment: Kyphotic  Communication   Communication: Other (comment) (speaks in whisper)  Cognition Arousal/Alertness: Awake/alert Behavior During Therapy: Anxious Overall Cognitive Status: Impaired/Different from baseline Area of Impairment: Attention;Following commands;Safety/judgement;Awareness;Problem solving;Memory   Current Attention Level: Focused Memory: Decreased short-term memory Following Commands: Follows one step commands inconsistently;Follows one step commands with increased time Safety/Judgement: Decreased awareness of safety;Decreased awareness of deficits Awareness: Intellectual Problem Solving: Slow processing;Difficulty sequencing;Requires verbal cues;Requires tactile cues General Comments: Pt was slow to respond to questions.  Pt having difficulty attending to tasks as well.  Delayed response time and had to constantly cue for maintaining posture and movment.       General Comments      Exercises General Exercises - Lower Extremity Ankle Circles/Pumps: AROM;Both;10 reps;Seated Long Arc Quad: AROM;Both;10 reps;Seated      Assessment/Plan    PT Assessment Patient needs continued PT services  PT Diagnosis Generalized weakness;Acute pain   PT Problem List Decreased balance;Decreased activity tolerance;Decreased mobility;Decreased knowledge of use of DME;Decreased coordination;Decreased cognition;Decreased strength;Decreased safety awareness;Decreased knowledge of precautions;Cardiopulmonary status limiting activity;Pain  PT Treatment Interventions DME instruction;Gait training;Functional mobility training;Therapeutic activities;Therapeutic  exercise;Balance training;Patient/family education   PT Goals (Current goals can be found in the Care Plan section) Acute Rehab PT Goals Patient Stated Goal: to get better PT Goal Formulation: With patient Time For Goal Achievement: 02/21/16 Potential to Achieve Goals: Good    Frequency Min 3X/week   Barriers to discharge        Co-evaluation               End of Session Equipment Utilized During Treatment: Gait belt Activity Tolerance: Patient limited by fatigue Patient left: in chair;with call bell/phone within reach;with chair alarm set Nurse Communication: Mobility status;Need for lift equipment         Time: [id number] PT Time Calculation (min) (ACUTE ONLY): 50 min   Charges:   PT Evaluation $PT Eval Moderate Complexity: 1 Procedure PT Treatments $Therapeutic Activity: 8-22 mins $Self Care/Home Management: 8-22   PT G Codes:        Harrol Novello F February 25, 2016, 2:01 PM Naasir Carreira,PT Acute Rehabilitation [phone] (pager)

## 2016-02-07 NOTE — Progress Notes (Signed)
Patient Name: Eric [name] Date of Encounter: 02/07/2016  Principal Problem:   Cardiac arrest [facility]) Active Problems:   Chronic systolic heart failure ([place])   Hyperlipidemia   Left bundle branch block   Obesity (BMI 30-39.9)   Atrial flutter ([place])   Acute respiratory failure ([place])   Encounter for central line placement   Arrhythmia   Altered mental status   HCAP (healthcare-associated pneumonia)   AKI (acute kidney injury) ([place])   Length of Stay: 6  SUBJECTIVE 6 days following witnessed arrest with immediate CPR, rapid defibrillation via AED and hypothermia. Extubated x 24h, alert, appears fully oriented, conversant and showing good insight and judgement. Has total amnesia for the events that led to hospitalization. Soft, hoarse voice. Swallow test pending. No CV complaints. No new arrhythmia on monitor. AF with borderline VR, around 100 at rest. Hypertensive. O2 sat 98% on RA, looks comfortable. Creatinine improvement has reached a plateau and worsened slightly - baseline seems to be 1.6. Excellent UO.  CURRENT MEDS . antiseptic oral rinse  7 mL Mouth Rinse BID  . atorvastatin  20 mg Per Tube q1800  . carvedilol  12.5 mg Oral BID WC  . chlorhexidine gluconate (SAGE KIT)  15 mL Mouth Rinse BID  . docusate sodium  100 mg Oral BID  . insulin aspart  2-6 Units Subcutaneous Q4H  . levothyroxine  75 mcg Per Tube QAC breakfast  . pantoprazole sodium  40 mg Per Tube Daily  . piperacillin-tazobactam (ZOSYN)  IV  3.375 g Intravenous Q8H  . polyethylene glycol  17 g Oral Daily  . sodium chloride flush  10-40 mL Intracatheter Q12H    OBJECTIVE   Intake/Output Summary (Last 24 hours) at 02/07/16 0817 Last data filed at 02/07/16 3491  Gross per 24 hour  Intake 894.27 ml  Output   3525 ml  Net -2630.73 ml   Filed Weights   02/05/16 0200 02/06/16 0428 02/07/16 0411  Weight: 113 kg (249 lb 1.9 oz) 111.4 kg (245 lb 9.5 oz) 107.8 kg (237 lb 10.5 oz)    PHYSICAL EXAM Filed  Vitals:   02/07/16 0411 02/07/16 0500 02/07/16 0600 02/07/16 0700  BP:      Pulse: 94 95 93 94  Temp:  100.2 F (37.9 C) 100.2 F (37.9 C) 100 F (37.8 C)  TempSrc:      Resp: '17 18 18 20  ' Height:      Weight: 107.8 kg (237 lb 10.5 oz)     SpO2: 99% 100% 100% 97%   General: Alert, oriented x3, no distress Head: no evidence of trauma, PERRL, EOMI, no exophtalmos or lid lag, no myxedema, no xanthelasma; normal ears, nose and oropharynx Neck: normal jugular venous pulsations and no hepatojugular reflux; brisk carotid pulses without delay and no carotid bruits Chest: clear to auscultation, no signs of consolidation by percussion or palpation, normal fremitus, symmetrical and full respiratory excursions Cardiovascular: normal position and quality of the apical impulse, regular rhythm, normal first and second heart sounds, no rubs or gallops, barely audible diastolic murmur Abdomen: no tenderness or distention, no masses by palpation, no abnormal pulsatility or arterial bruits, normal bowel sounds, no hepatosplenomegaly Extremities: no clubbing, cyanosis or edema; 2+ radial, ulnar and brachial pulses bilaterally; 2+ right femoral, posterior tibial and dorsalis pedis pulses; 2+ left femoral, posterior tibial and dorsalis pedis pulses; no subclavian or femoral bruits Neurological: grossly nonfocal  LABS  CBC  Recent Labs  02/06/16 0509 02/07/16 0415  WBC 9.0 11.1*  HGB 12.4* 13.8  HCT 36.8* 41.0  MCV 94.1 94.3  PLT 215 373   Basic Metabolic Panel  Recent Labs  02/05/16 1522 02/06/16 0509 02/07/16 0415  NA 143 142 142  K 4.9 4.3 4.1  CL 112* 110 107  CO2 20* 22 24  GLUCOSE 185* 150* 148*  BUN 52* 51* 50*  CREATININE 2.31* 2.06* 2.24*  CALCIUM 9.5 9.1 9.1  MG 2.4  --   --   PHOS 3.2  --   --    Liver Function Tests No results for input(s): AST, ALT, ALKPHOS, BILITOT, PROT, ALBUMIN in the last 72 hours. No results for input(s): LIPASE, AMYLASE in the last 72  hours. Cardiac Enzymes  Recent Labs  02/05/16 1522  TROPONINI 0.08*   Radiology Studies Imaging results have been reviewed and Dg Chest Port 1 View  02/07/2016  CLINICAL DATA:  Respiratory failure. EXAM: PORTABLE CHEST 1 VIEW COMPARISON:  02/06/2016. FINDINGS: Interim extubation and removal of NG tube. Left IJ line in stable position. Cardiomegaly with persistent pulmonary venous congestion. Persistent low lung volumes with bibasilar atelectasis and/or infiltrates/edema. Tiny left pleural effusion cannot be excluded. No pneumothorax. Similar findings noted on prior exam. IMPRESSION: 1. Interim extubation and removal of NG tube. 2. Cardiomegaly with pulmonary venous congestion again noted. No interim change. 3. Low lung volumes with mild bibasilar atelectasis and/or infiltrates/edema again noted. No interim change. Electronically Signed   By: Marcello Moores  Register   On: 02/07/2016 07:09   Dg Chest Port 1 View  02/06/2016  CLINICAL DATA:  65 year old male with history of respiratory failure. EXAM: PORTABLE CHEST 1 VIEW COMPARISON:  Chest x-ray 02/05/2016. FINDINGS: An endotracheal tube is in place with tip 3.0 cm above the carina. There is a left-sided internal jugular central venous catheter with tip terminating in the distal superior vena cava. A nasogastric tube is seen extending into the stomach, however, the tip of the nasogastric tube extends below the lower margin of the image. Lung volumes are low. Bibasilar opacities may reflect areas of atelectasis and/or airspace consolidation. Crowding of the pulmonary vasculature, accentuated by low lung volumes, without frank pulmonary edema. Small left pleural effusion. Mild cardiomegaly. Upper mediastinal contours are within normal limits. Atherosclerotic calcifications in the thoracic aorta. IMPRESSION: 1. Support apparatus, as above. 2. Low lung volumes with bibasilar opacities that may reflect areas of atelectasis and/or airspace consolidation. 3. Small left  pleural effusion. 4. Cardiomegaly with pulmonary venous congestion. 5. Atherosclerosis. Electronically Signed   By: Vinnie Langton M.D.   On: 02/06/2016 09:15    TELE AF with VR around 100  ECG AFib, LBBB QRS 162 ms  ASSESSMENT AND PLAN   1. S/P cardiac arrest, presumed VT/VF Has chronic LBBB and atrial arrhythmia, so not 428% certainty about offending rhythm. Regardless, probably needs ICD (CRT-D) on this admission, possibly even if ischemic heart disease is identified and treated with revascularization. Normal electrolytes on admission. Moderate to severely depressed LVEF despite chronic comprehensive medical treatment.  2. Chronic systolic heart failure  Negative fluid balance since admission. No CHF on CXR. No clinical evidence of volume overload. Was on full dose carvedilol, Bidil and spironolactone prior to these events. AI was mild on TEE 2013, when EF was 25-30%, so it is unlikely the reason for his cardiomyopathy. HTN, idiopathic dilated CMP are possible etiologies, but have to exclude CAD.   3. Aortic valve regurgitation,  Mild aortic regurgitation by previous echo, severe on most recent study (was it underestimated? Did it  acutely worsen?). 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 A TEE would be very helpful. May need Bentall procedure.  4. Atrial fibrillation, persistent Currently in atrial fibrillation with borderline controlled rate. Increase back to home dose of carvedilol. On IV heparin, home DOAC has been stopped. Previous consideration was given to ablation for flutter, but not felt to be helpful: see note 2014 Dr. Caryl Comes.  5. Hypertension, accelerated Elevated systolic pressure in the 323/46 range. Increase carvedilol.  6. Aortic root enlargement (HCC) Will need CT Angio when renal function is clearly stabilized.  7. 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 Hold  diuretics  8. Left bundle branch block typical LBBB with QRS duration 111m predicts excellent response to CRT, especially if he has nonischemic CMP.  9.  Hyperlipidemia with good recent LDL  10. Possible aspiration pneumonia, GNR in sputum. On ABx. Swallow study today.   MSanda Klein MD, F[facility] [phone] 02/07/2016 8:17 AM

## 2016-02-07 NOTE — Progress Notes (Signed)
Nutrition Follow-up  DOCUMENTATION CODES:   Morbid obesity  INTERVENTION:   Supplement diet once advanced as needed.    NUTRITION DIAGNOSIS:   Inadequate oral intake related to inability to eat as evidenced by NPO status. Ongoing.   GOAL:   Patient will meet greater than or equal to 90% of their needs Not met.   MONITOR:   Diet advancement, I & O's, Labs  ASSESSMENT:   Pt with known sCHF and A.fib, presented 05/08 after out of hospital cardiac arrest. ROSC within 15-20 minutes after defib x 2. Hypothermia protocol. Cooling completed.  5/14 extubated 5/15 failed swallow eval Pt reports good appetite and no weight loss PTA.  Medications reviewed and include: colace, miralax CBG's: 141-185  Diet Order:    NPO  Skin:  Reviewed, no issues  Last BM:  5/14  Height:   Ht Readings from Last 1 Encounters:  02/01/16 '5\' 9"'  (1.753 m)    Weight:   Wt Readings from Last 1 Encounters:  02/07/16 237 lb 10.5 oz (107.8 kg)    Ideal Body Weight:  72.7 kg  BMI:  Body mass index is 35.08 kg/(m^2).  Estimated Nutritional Needs:   Kcal:  2000-2200  Protein:  110-130 grams  Fluid:  > 2 L/day  EDUCATION NEEDS:   No education needs identified at this time  [place], [place], [place] Pager [phone] After Hours Pager

## 2016-02-07 NOTE — Progress Notes (Signed)
   Have reviewed the clinical data.  He is clinically improved.  Dr. Victorino December note is reviewed. I agree that we must search for the etiology of the patient's cardiac arrest. My primary concern is ischemia in the setting of diabetes, hypertension, and chronic kidney disease. He had a remote myocardial perfusion study done at [facility] cardiology greater than 4 years ago. I believe he will need right and left heart catheterization prior to hospital discharge. We will then be able to determine next steps including device therapy versus revascularization.  For the time being we will optimize medical therapy, begin PT, optimize kidney function, and continue clinical support until cardiac cath.

## 2016-02-07 NOTE — Progress Notes (Signed)
Pt stating that his left lower back is hurting. He also demonstrated that he can raise his right leg with no issues, his left leg barely moves off the floor and it is very painful. MD being paged.

## 2016-02-07 NOTE — Evaluation (Signed)
Clinical/Bedside Swallow Evaluation Patient Details  Name: Eric [name] MRN: [id number] Date of Birth: 1951/01/28  Today's Date: 02/07/2016 Time: SLP Start Time (ACUTE ONLY): 1100 SLP Stop Time (ACUTE ONLY): 1115 SLP Time Calculation (min) (ACUTE ONLY): 15 min  Past Medical History:  Past Medical History  Diagnosis Date  . Claustrophobia   . Heart murmur   . Hypertension   . Varicose vein of leg     right  . Dysrhythmia     "palpitations"  . Migraine 02/13/12    "opthalmic"  . Chronic lower back pain   . Exertional dyspnea 01/2012  . CHF (congestive heart failure) ([place])   . Hypothyroidism   . Chronic kidney disease     kidney fx studies increased    Past Surgical History:  Past Surgical History  Procedure Laterality Date  . Skin melanocytoma excision  2012    "above left clavicle"  . Finger surgery  2012    "4th digit right hand; thumb on left hand"  . Tee without cardioversion  03/22/2012    Procedure: TRANSESOPHAGEAL ECHOCARDIOGRAM (TEE);  Surgeon: Candee Furbish, MD;  Location: [facility] ENDOSCOPY;  Service: Cardiovascular;  Laterality: N/A;  . Cardioversion  03/22/2012    Procedure: CARDIOVERSION;  Surgeon: Candee Furbish, MD;  Location: [place];  Service: Cardiovascular;  Laterality: N/A;  . Cardioversion  04/19/2012    Procedure: CARDIOVERSION;  Surgeon: Sinclair Grooms, MD;  Location: [place];  Service: Cardiovascular;  Laterality: N/A;  . Colonoscopy N/A 04/11/2013    Procedure: COLONOSCOPY;  Surgeon: Inda Castle, MD;  Location: [facility] ENDOSCOPY;  Service: Endoscopy;  Laterality: N/A;  . Colonoscopy N/A 04/11/2013    Procedure: COLONOSCOPY;  Surgeon: Inda Castle, MD;  Location: [facility] ENDOSCOPY;  Service: Endoscopy;  Laterality: N/A;   HPI:  65 y.o. M with known sCHF and A.fib, presented 05/08 after out of hospital cardiac arrest. ROSC within 15-20 minutes after defib x 2. ETT 5/8-5/14. Now with acute encephalopathy with concern for anoxia per MD notes.   Assessment / Plan  / Recommendation Clinical Impression  Pt presents with dysphagia s/p prolonged intubation, with aphonia suggesting poor vocal fold adduction necessary for airway protection; multiple subswallows per bolus and limited hyolaryngeal elevation per palpation.  Pt presents with multiple risk factors for aspiration, particularly silent aspiration given length of oral intubation.  For today, recommend continuing NPO status except occasional ice chips; oral meds whole in puree.  Pt will need instrumental swallow study prior to initiating full diet - will follow up next date for readiness.  D/W pt, RN.     Aspiration Risk  Moderate aspiration risk    Diet Recommendation   npo except ice chips, oral meds  Medication Administration: Whole meds with puree    Other  Recommendations Oral Care Recommendations: Oral care QID;Oral care prior to ice chip/H20   Follow up Recommendations       Frequency and Duration min 3x week  2 weeks       Prognosis Prognosis for Safe Diet Advancement: Good      Swallow Study   General Date of Onset: 01/31/16 [name] with known sCHF and A.fib, presented 05/08 after out of hospital cardiac arrest. ROSC within 15-20 minutes after defib x 2. ETT 5/8-5/14. Now with acute encephalopathy with concern for anoxia per MD notes. Type of Study: Bedside Swallow Evaluation Diet Prior to this Study: NPO Temperature Spikes Noted: Yes Respiratory Status: Room air History of Recent Intubation: Yes  Length of Intubations (days): 6 days Date extubated: 02/06/16 Behavior/Cognition: Alert;Cooperative Oral Cavity Assessment: Within Functional Limits Oral Care Completed by SLP: No Oral Cavity - Dentition: Adequate natural dentition Vision: Functional for self-feeding Self-Feeding Abilities: Able to feed self Patient Positioning: Upright in chair Baseline Vocal Quality: Aphonic Volitional Cough: Weak Volitional Swallow: Able to elicit    Oral/Motor/Sensory Function Overall  Oral Motor/Sensory Function: Within functional limits   Ice Chips Ice chips: Within functional limits   Thin Liquid Thin Liquid: Impaired Presentation: Cup;Self Fed Pharyngeal  Phase Impairments: Multiple swallows;Decreased hyoid-laryngeal movement;Suspected delayed Swallow;Throat Clearing - Immediate    Nectar Thick Nectar Thick Liquid: Not tested   Honey Thick Honey Thick Liquid: Not tested   Puree Puree: Impaired Presentation: Spoon Pharyngeal Phase Impairments: Suspected delayed Swallow;Decreased hyoid-laryngeal movement;Multiple swallows   Solid   GO   Solid: Not tested       Estill Bamberg L. Tivis Ringer, [place] CCC/SLP Pager [phone]  [name] 02/07/2016,11:23 AM

## 2016-02-07 NOTE — Progress Notes (Signed)
Rehab Admissions Coordinator Note:  Patient was screened by Cleatrice Burke for appropriateness for an Inpatient Acute Rehab Consult per PT recommendation.   At this time, we are recommending Inpatient Rehab consult. Please place order.  Cleatrice Burke 02/07/2016, 2:07 PM  I can be reached at [phone].

## 2016-02-07 NOTE — Progress Notes (Addendum)
[place] for Heparin Indication: atrial fibrillation  Allergies  Allergen Reactions  . Lactose Intolerance (Gi) Diarrhea    Patient Measurements: Height: 5\' 9"  (175.3 cm) Weight: 237 lb 10.5 oz (107.8 kg) IBW/kg (Calculated) : 70.7 Heparin Dosing Weight: 95kg   Vital Signs: Temp: 100.4 F (38 C) (05/15 0400) Temp Source: Core (Comment) (05/15 0400) Pulse Rate: 94 (05/15 0411)  Labs:  Recent Labs  02/05/16 1522 02/06/16 0509 02/06/16 1630 02/07/16 0415  HGB 13.0 12.4*  --  13.8  HCT 40.0 36.8*  --  41.0  PLT 229 215  --  237  APTT  --   --  85* 85*  HEPARINUNFRC  --   --  0.92* 0.98*  CREATININE 2.31* 2.06*  --  2.24*  TROPONINI 0.08*  --   --   --     Estimated Creatinine Clearance: 40.3 mL/min (by C-G formula based on Cr of 2.24).  Assessment: 65 y.o. male admitted s/p cardiac arrest, h/o Afib with Eliquis PTA (last dose 5/8). Pt had hemoptysis and hematuria while on heparin therefore heparin held on 5/12, restarted 5/14. HL this AM is elevated to 0.98.   Goal of Therapy:  Heparin level 0.3-0.7 units/ml Monitor platelets by anticoagulation protocol: Yes   Plan:  Decrease heparin to 1200 units/hr F/u 6 hour heparin level Daily HL/CBC Monitor for s/sx bleeding   Joya San, PharmD Clinical Pharmacy Resident Pager # [phone] 02/07/2016 7:36 AM     ADDENDUM  HL remains supratherapeutic at 0.96. Decrease heparin to 1000 units/hr. F/u 6 hour heparin level. Monitor for s/sx of bleeding.  Joya San, PharmD Clinical Pharmacy Resident Pager # [phone] 02/07/2016 4:15 PM

## 2016-02-07 NOTE — Progress Notes (Signed)
Eric [name] for Heparin Indication: atrial fibrillation  Allergies  Allergen Reactions  . Lactose Intolerance (Gi) Diarrhea    Patient Measurements: Height: 5\' 9"  (175.3 cm) Weight: 237 lb 10.5 oz (107.8 kg) IBW/kg (Calculated) : 70.7 Heparin Dosing Weight: 95kg   Vital Signs: Temp: 100.4 F (38 C) (05/15 0400) Temp Source: Core (Comment) (05/15 0400) Pulse Rate: 94 (05/15 0411)  Labs:  Recent Labs  02/05/16 1522 02/06/16 0509 02/06/16 1630 02/07/16 0415  HGB 13.0 12.4*  --  13.8  HCT 40.0 36.8*  --  41.0  PLT 229 215  --  237  APTT  --   --  85* 85*  HEPARINUNFRC  --   --  0.92* 0.98*  CREATININE 2.31* 2.06*  --  2.24*  TROPONINI 0.08*  --   --   --     Estimated Creatinine Clearance: 40.3 mL/min (by C-G formula based on Cr of 2.24).  Assessment: 66 y.o. male admitted s/p cardiac arrest, h/o Afib and Eliquis on hold, for heparin   Goal of Therapy:  APTT 66-102 Heparin level 0.3-0.7 units/ml Monitor platelets by anticoagulation protocol: Yes   Plan:  Continue Heparin at current rate   Phillis Knack, PharmD, BCPS  02/07/2016 5:34 AM

## 2016-02-07 NOTE — Progress Notes (Signed)
PULMONARY / CRITICAL CARE MEDICINE   Name: Rumi Flexer MRN: [id number] DOB: 09-03-1951    ADMISSION DATE:  01/31/2016 CONSULTATION DATE:  01/31/16  REFERRING MD:  EDP  CHIEF COMPLAINT:  Cardiac Arrest  HISTORY OF PRESENT ILLNESS:   Eric [name] is a 65 y.o. male with PMH as outlined below including known systolic heart failure and A.fib on Eliquis.  He was in his USOH until evening of 05/08 when he was with his wife at [place].  As they were leaving [employer], he began driving the car and while still in the parking lot, he began to breath very heavily before becoming unresponsive.  His wife immediately got the car stopped, got him out of the car and began CPR while calling EMS.  She feels that EMS arrived within 5 - 10 minutes and upon their arrival, he received 2 defibrillations via AED.  ROSC was achieved within roughly 15 - 20 minutes.  Upon ED arrival, he remained unresponsive and was subsequently intubated.  PCCM was called for admission and consideration of hypothermia protocol.  Cardiology was also called in consultation.  Per his wife, he had not had any complaints recently and had behaved completely normal.  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  SUBJECTIVE:  Extubated yesterday.  VITAL SIGNS: BP 162/96 mmHg  Pulse 94  Temp(Src) 100.4 F (38 C) (Core (Comment))  Resp 17  Ht 5\' 9"  (1.753 m)  Wt 237 lb 10.5 oz (107.8 kg)  BMI 35.08 kg/m2  SpO2 99%  HEMODYNAMICS: CVP:  [4 mmHg-6 mmHg] 6 mmHg  VENTILATOR SETTINGS: Vent Mode:  [-] CPAP;PSV FiO2 (%):  [40 %] 40 % PEEP:  [5 cmH20] 5 cmH20 Pressure Support:  [8 cmH20] 8 cmH20  INTAKE / OUTPUT: I/O last 3 completed shifts: In: 3765.4 [P.O.:30; I.V.:1460.4; Other:150; BC:[id number]; IV Piggyback:450] Out: [id number] [Urine:7540]   PHYSICAL EXAMINATION: General: Awake, oriented. Speaks with a hoarse voice Neuro: Moves all four extremities. No focal deficits HEENT: NO  thyromegaly, JVD Cardiovascular: S1, S2, RRR Lungs: Clear, no wheeze, crackles Abdomen: BS x 4, soft, NT, ND.  Musculoskeletal: No gross deformities, no edema Skin: No rashes.  LABS:  BMET  Recent Labs Lab 02/05/16 0653 02/05/16 1522 02/06/16 0509  NA 143 143 142  K 4.2 4.9 4.3  CL 113* 112* 110  CO2 21* 20* 22  BUN 48* 52* 51*  CREATININE 2.17* 2.31* 2.06*  GLUCOSE 139* 185* 150*    Electrolytes  Recent Labs Lab 02/01/16 0450  02/02/16 0200  02/03/16 0340  02/05/16 0653 02/05/16 1522 02/06/16 0509  CALCIUM 8.6*  < > 9.2  < > 8.6*  < > 8.9 9.5 9.1  MG 1.8  --  2.1  --  2.2  --   --  2.4  --   PHOS 1.6*  --  3.0  --   --   --   --  3.2  --   < > = values in this interval not displayed.  CBC  Recent Labs Lab 02/05/16 0653 02/05/16 1522 02/06/16 0509  WBC 8.1 11.5* 9.0  HGB 11.3* 13.0 12.4*  HCT 34.9* 40.0 36.8*  PLT 184 229 215    Coag's  Recent Labs Lab 02/01/16 0105 02/01/16 0653  02/01/16 1115  02/03/16 0340 02/04/16 0346 02/06/16 1630  APTT 25 42*  < >  --   < > 85* 83* 85*  INR 1.29 1.51*  --  1.50*  --   --   --   --   < > =  values in this interval not displayed.  Sepsis Markers  Recent Labs Lab 02/01/16 0200 02/01/16 0450 02/03/16 1135 02/04/16 0346 02/05/16 0653  LATICACIDVEN 1.2 1.4  --   --   --   PROCALCITON  --   --  1.52 1.77 0.98    ABG  Recent Labs Lab 01/31/16 2308 02/03/16 0928  PHART 7.343* 7.312*  PCO2ART 43.2 37.3  PO2ART 286.0* 91.0    Liver Enzymes  Recent Labs Lab 01/31/16 2206 02/02/16 0350  AST 140* 75*  ALT 84* 69*  ALKPHOS 51 45  BILITOT 1.2 0.9  ALBUMIN 3.6 3.5    Cardiac Enzymes  Recent Labs Lab 02/01/16 1215 02/01/16 1759 02/05/16 1522  TROPONINI 0.08* 0.06* 0.08*    Glucose  Recent Labs Lab 02/06/16 0036 02/06/16 0445 02/06/16 0810 02/06/16 1207 02/06/16 1627 02/06/16 2006  GLUCAP 185* 162* 169* 114* 139* 132*    Imaging Dg Chest Port 1 View  02/06/2016   CLINICAL DATA:  65 year old male with history of respiratory failure. EXAM: PORTABLE CHEST 1 VIEW COMPARISON:  Chest x-ray 02/05/2016. FINDINGS: An endotracheal tube is in place with tip 3.0 cm above the carina. There is a left-sided internal jugular central venous catheter with tip terminating in the distal superior vena cava. A nasogastric tube is seen extending into the stomach, however, the tip of the nasogastric tube extends below the lower margin of the image. Lung volumes are low. Bibasilar opacities may reflect areas of atelectasis and/or airspace consolidation. Crowding of the pulmonary vasculature, accentuated by low lung volumes, without frank pulmonary edema. Small left pleural effusion. Mild cardiomegaly. Upper mediastinal contours are within normal limits. Atherosclerotic calcifications in the thoracic aorta. IMPRESSION: 1. Support apparatus, as above. 2. Low lung volumes with bibasilar opacities that may reflect areas of atelectasis and/or airspace consolidation. 3. Small left pleural effusion. 4. Cardiomegaly with pulmonary venous congestion. 5. Atherosclerosis. Electronically Signed   By: Vinnie Langton M.D.   On: 02/06/2016 09:15     STUDIES:  CXR 05/08 >>no acute process. CT head 05/08 >>no acute process Echo 5/9>>EF 30 to 35%, severe aortic regurgitation EEG 5/9>>no focal, hemispheric, or lateralizing features; demonstrated a burst/suppression pattern, which can be consistent with a sedated EEG 5/11>>mod diffuse slowing, No epileptiform activity was recorded  CULTURES: Blood 05/08 >>no growth 5/11>> Urine 05/08 >>no growth 5/10 Sputum 05/08 >>few GPC, few GNR>>> MRSA PCR 5/9>>Negative BCx2 5/12>>>  ANTIBIOTICS: Zosyn 5/11>> Vancomycin 5/11>> 5/14  SIGNIFICANT EVENTS: 05/08 - admitted after cardiac arrest.  Started on TTM - 33 degrees C. 5/10-pt rewarmed per hypothermia protocol 5/14 >> Extubated  LINES/TUBES: ETT 05/08 > R radial a line 05/08 > Right IJ CVL 05/08  >  DISCUSSION: 65 y.o. M with known sCHF and A.fib, presented 05/08 after out of hospital cardiac arrest.  ROSC within 15-20 minutes after defib x 2.  Started on hypothermia protocol, 33 degrees celsius 5/8 rewarmed 5/10  ASSESSMENT / PLAN:  CARDIOVASCULAR A:  Cardiac arrest - VF got shocked, r/o ischemia Aortic aneurysm Hx HTN, A.fib on Eliquis, sCHF (EF 40-45% per echo from April 2017) P:  Goal MAP > 65 Restarted on heparin gtt  On [place] for wide complex rythm Cards following, appreciate the assistance 4.9 cm aortic aneurysm -- will need f/u CT of chest once extubated and Cr improved  Planned cath per cards when more stable  Continue outpatient atorvastatin Hold outpatient carvedilol, eliquis, bidil, spironolactone  NEUROLOGIC A:   Acute encephalopathy - concern for anoxia given exam findings  Hx migraines, chronic lower back pain. P:   Stable post extubation  PULMONARY PNA - suspect aspiration  Mild pulmonary edema Acute hypoxic respiratory failure - in the setting of cardiac arrest P:   Continue abx as above. Off vanc  F/U CXR  RENAL A:   AoCKD - improving slowly  Hyperkalemia-resolved P:   F/u BMP  GASTROINTESTINAL A:   GI prophylaxis. Nutrition. P:  SUP: Pantoprazole. Swallow eval  HEMATOLOGIC A:   VTE Prophylaxis. P:  SCD's  Heparin gtt  Trend CBC's and Coags Monitor for s/sx of bleeding  INFECTIOUS Aspiration Pneumonia P:   Cont Zosyn with ongoing low grade fevers. Continue antibiotics as listed above Trend WBC's, procalcitonins and monitor fever curve  ENDOCRINE A:  Hypoglycemia Hypothyroidism. P:   Continue outpatient synthroid SSI   Family updated: No family available 5/14  Marshell Garfinkel MD [facility] Pager [phone] If no answer or after 3pm call: [phone] 02/07/2016, 4:57 AM

## 2016-02-07 NOTE — Consult Note (Signed)
ELECTROPHYSIOLOGY CONSULT NOTE    Patient ID: Eric [name] MRN: [id number], DOB/AGE: Jul 26, 1951 65 y.o.  Admit date: 01/31/2016 Date of Consult: 02/07/2016  Primary Physician: [name], MD Primary Cardiologist: Dr. Tamala Julian Requesting MD: Dr. Sallyanne Kuster  Reason for Consultation: evaluation for ICD implant  HPI: Eric [name] is a 65 y.o. male admitted to [facility] 5/817 after a witnessed cardiac arrest.  Notes states possible 1-89minute until CPR started (patient driving, his wife able to get the car stopped, called 911 got him out of the car and started CPR) when EMS arrived AED gave 2 shocks, intubated by EMS. Placed on hypothermia protocol  5/9, note mentions HR 30's with 2nd degree AVBlock, K+ 3.0, QT prolongation and IV amio stopped  PMHx include CRI, stage IV, LBBB, NICM, CHF, AFib described as peristent on a/c, AFlutter was mentioned in out patient notes though patient had declined ablation, HTN, hypothyroid, chronic back pain.  He was last seen out patient by cardiology, Dr. Tamala Julian in April (prior to that has been a year) and had updated echo done.  Admit labs noted: K+ 4.9 BUN/Creat 30/2.17 Trop <0.03, 0.09, 0.11, 0.11, 0.08, 0.06 H/H 13.2/39.0 TSH 2.890 INR 1.23  Past Medical History  Diagnosis Date  . Claustrophobia   . Heart murmur   . Hypertension   . Varicose vein of leg     right  . Dysrhythmia     "palpitations"  . Migraine 02/13/12    "opthalmic"  . Chronic lower back pain   . Exertional dyspnea 01/2012  . CHF (congestive heart failure) ([place])   . Hypothyroidism   . Chronic kidney disease     kidney fx studies increased      Surgical History:  Past Surgical History  Procedure Laterality Date  . Skin melanocytoma excision  2012    "above left clavicle"  . Finger surgery  2012    "4th digit right hand; thumb on left hand"  . Tee without cardioversion  03/22/2012    Procedure: TRANSESOPHAGEAL ECHOCARDIOGRAM (TEE);  Surgeon: Candee Furbish, MD;  Location:  [facility] ENDOSCOPY;  Service: Cardiovascular;  Laterality: N/A;  . Cardioversion  03/22/2012    Procedure: CARDIOVERSION;  Surgeon: Candee Furbish, MD;  Location: [place];  Service: Cardiovascular;  Laterality: N/A;  . Cardioversion  04/19/2012    Procedure: CARDIOVERSION;  Surgeon: Sinclair Grooms, MD;  Location: [place];  Service: Cardiovascular;  Laterality: N/A;  . Colonoscopy N/A 04/11/2013    Procedure: COLONOSCOPY;  Surgeon: Inda Castle, MD;  Location: [facility] ENDOSCOPY;  Service: Endoscopy;  Laterality: N/A;  . Colonoscopy N/A 04/11/2013    Procedure: COLONOSCOPY;  Surgeon: Inda Castle, MD;  Location: [facility] ENDOSCOPY;  Service: Endoscopy;  Laterality: N/A;     Prescriptions prior to admission  Medication Sig Dispense Refill Last Dose  . Ascorbic Acid (VITAMIN C WITH ROSE HIPS) 1000 MG tablet Take 1,000 mg by mouth daily.   Past Week at Unknown time  . atorvastatin (LIPITOR) 20 MG tablet TAKE 1 TABLET BY MOUTH DAILY 90 tablet 0 01/30/2016 at pm  . b complex vitamins tablet Take 1 tablet by mouth daily as needed (cold/ energy).    Past Week at Unknown time  . carvedilol (COREG) 25 MG tablet TAKE 1 TABLET BY MOUTH TWICE DAILY 60 tablet 11 01/31/2016 at pm  . Cholecalciferol 2000 units CAPS Take 2,000 Units by mouth 3 (three) times a week.   Past Week at Unknown time  . Coenzyme Q10 (COQ10)  200 MG CAPS Take 200 mg by mouth daily.   Taking  . ELIQUIS 5 MG TABS tablet TAKE 1 TABLET BY MOUTH TWICE DAILY 60 tablet 11 01/31/2016 at pm  . fluticasone (FLONASE) 50 MCG/ACT nasal spray Place 2 sprays into the nose as needed for allergies.    Past Month at Unknown time  . furosemide (LASIX) 40 MG tablet TAKE 1 TABLET BY MOUTH DAILY 90 tablet 0 01/31/2016 at am  . isosorbide-hydrALAZINE (BIDIL) 20-37.5 MG per tablet Take 1 tablet by mouth 2 (two) times daily.   01/31/2016 at pm  . levothyroxine (SYNTHROID, LEVOTHROID) 75 MCG tablet Take 1 tablet (75 mcg total) by mouth daily before breakfast. 30 tablet 11 01/31/2016 at  am  . magnesium oxide (MAG-OX) 400 MG tablet Take 400 mg by mouth daily.   01/31/2016 at am  . spironolactone (ALDACTONE) 25 MG tablet TAKE 1 TABLET BY MOUTH EVERY DAY 30 tablet 11 01/31/2016 at am    Inpatient Medications:  . antiseptic oral rinse  7 mL Mouth Rinse BID  . atorvastatin  20 mg Per Tube q1800  . carvedilol  12.5 mg Oral BID WC  . docusate sodium  100 mg Oral BID  . insulin aspart  2-6 Units Subcutaneous Q4H  . levothyroxine  75 mcg Per Tube QAC breakfast  . pantoprazole sodium  40 mg Per Tube Daily  . piperacillin-tazobactam (ZOSYN)  IV  3.375 g Intravenous Q8H  . polyethylene glycol  17 g Oral Daily  . sodium chloride flush  10-40 mL Intracatheter Q12H    Allergies:  Allergies  Allergen Reactions  . Lactose Intolerance (Gi) Diarrhea    Social History   Social History  . Marital Status: Married    Spouse Name: [name]  . Number of Children: 1  . Years of Education: N/A   Occupational History  .     Social History Main Topics  . Smoking status: Never Smoker   . Smokeless tobacco: Never Used  . Alcohol Use: Yes     Comment: 02/13/12 "socially"  . Drug Use: No  . Sexual Activity: Not Currently   Other Topics Concern  . 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     Family History  Problem Relation Age of Onset  . Hypertension Mother   . Heart disease Mother   . Hypertension Father   . Heart disease Father   . Heart failure Mother   . Diabetes Mother   . Heart failure Father   . Heart failure Mother      Review of Systems: All other systems reviewed and are otherwise negative except as noted above.  Physical Exam: Filed Vitals:   02/07/16 0852 02/07/16 0900 02/07/16 0905 02/07/16 0945  BP: 180/89  175/89   Pulse: 123 100  95  Temp:  100.2 F (37.9 C)  100 F (37.8 C)  TempSrc:      Resp:  19  15  Height:      Weight:      SpO2:  97%  97%     GEN- The patient  is OOB to chair, in NAD, alert and oriented x 3 today.   HEENT: normocephalic, atraumatic; sclera clear, conjunctiva pink; hearing intact; oropharynx clear; neck supple Lymph- no cervical lymphadenopathy Lungs- Clear to ausculation bilaterally, normal work of breathing.  No wheezes, rales, rhonchi Heart- Regular rate and rhythm, no murmurs, rubs  or gallops GI- soft, non-tender, non-distended, bowel sounds present Extremities- no clubbing, cyanosis, or edema; DP/PT/radial pulses 2+ bilaterally MS- no significant deformity or atrophy Skin- warm and dry, no rash or lesion Psych- euthymic mood, full affect Neuro- no gross deficits observed  Labs:   Lab Results  Component Value Date   WBC 11.1* 02/07/2016   HGB 13.8 02/07/2016   HCT 41.0 02/07/2016   MCV 94.3 02/07/2016   PLT 237 02/07/2016    Recent Labs Lab 02/02/16 0350  02/07/16 0415  NA 145  < > 142  K 4.5  < > 4.1  CL 114*  < > 107  CO2 23  < > 24  BUN 22*  < > 50*  CREATININE 2.00*  < > 2.24*  CALCIUM 9.0  < > 9.1  PROT 7.3  --   --   BILITOT 0.9  --   --   ALKPHOS 45  --   --   ALT 69*  --   --   AST 75*  --   --   GLUCOSE 98  < > 148*  < > = values in this interval not displayed.    Radiology/Studies:  Ct Head Wo Contrast 02/01/2016  CLINICAL DATA:  Cardiac arrest EXAM: CT HEAD WITHOUT CONTRAST TECHNIQUE: Contiguous axial images were obtained from the base of the skull through the vertex without intravenous contrast. COMPARISON:  None. FINDINGS: There is no intracranial hemorrhage, mass or evidence of acute infarction. There is no extra-axial fluid collection. Gray matter and white matter appear normal. Cerebral volume is normal for age. Brainstem and posterior fossa are unremarkable. The CSF spaces appear normal. The bony structures are intact. The visible portions of the paranasal sinuses are clear. The orbits are unremarkable. IMPRESSION: Normal brain Electronically Signed   By: Andreas Newport M.D.   On: 02/01/2016  00:49   Dg Chest Port 1 View 02/07/2016  CLINICAL DATA:  Respiratory failure. EXAM: PORTABLE CHEST 1 VIEW COMPARISON:  02/06/2016. FINDINGS: Interim extubation and removal of NG tube. Left IJ line in stable position. Cardiomegaly with persistent pulmonary venous congestion. Persistent low lung volumes with bibasilar atelectasis and/or infiltrates/edema. Tiny left pleural effusion cannot be excluded. No pneumothorax. Similar findings noted on prior exam. IMPRESSION: 1. Interim extubation and removal of NG tube. 2. Cardiomegaly with pulmonary venous congestion again noted. No interim change. 3. Low lung volumes with mild bibasilar atelectasis and/or infiltrates/edema again noted. No interim change. Electronically Signed   By: Marcello Moores  Register   On: 02/07/2016 07:09     EKG:  02/05/16 NSR LBBB 26/16/43 TELEMETRY:  See bleow 02/01/16: Echocardiogram Study Conclusions - Left ventricle: The cavity size was severely dilated. There was  mild focal basal hypertrophy of the septum. Systolic function was  moderately to severely reduced. The estimated ejection fraction  was in the range of 30% to 35%. Diffuse hypokinesis. Features are  consistent with a pseudonormal left ventricular filling pattern,  with concomitant abnormal relaxation and increased filling  pressure (grade 2 diastolic dysfunction). Doppler parameters are  consistent with elevated ventricular end-diastolic filling  pressure. - Ventricular septum: Septal motion showed abnormal function and  dyssynergy. - Aortic valve: There was severe regurgitation. - Aortic root: The aortic root was dilated measurinh 49 mm. - Ascending aorta: The ascending aorta was dilated measuring 43 mm. - Left atrium: The atrium was moderately dilated. - Right ventricle: The cavity size was normal. Wall thickness was  normal. Systolic function was moderately reduced. - Right atrium:  The atrium was normal in size. - Tricuspid valve: There was mild  regurgitation. - Pulmonic valve: There was moderate regurgitation. - Pulmonary arteries: Systolic pressure was within the normal  range. PA peak pressure: 32 mm Hg (S). - Inferior vena cava: The vessel was normal in size. - Pericardium, extracardiac: There was no pericardial effusion.  There was a left pleural effusion. Impressions: - Dilated aortc root, with bicuspid aortic valve and severe aortic  regurgitation with eccentric jet. Mildly dilated ascending aorta.   01/19/16: Echocardiogram Study Conclusions - Left ventricle: The cavity size was moderately dilated. Wall  thickness was normal. Systolic function was mildly to moderately  reduced. The estimated ejection fraction was in the range of 40%  to 45%. Diffuse hypokinesis. - Ventricular septum: Septal motion showed abnormal function,  dyssynergy, and paradox. These changes are consistent with  intraventricular conduction delay. - Aortic valve: There was mild to moderate regurgitation directed  centrally in the LVOT. - Left atrium: The atrium was severely dilated. - Right ventricle: The cavity size was mildly dilated. - Right atrium: The atrium was severely dilated. - Atrial septum: No defect or patent foramen ovale was identified.    Assessment and Plan:  1. Cardiac arrest EMS record reviewed. FD first to respond applied AED and advised shock, patient received 2 shocks followed by further CPR with ROSC      Intubated in ED      s/p Hypothermia protocol, rewarming completed 02/03/16            Chronic LBBB      EF now 30-35% (40-45% only 3 weeks ago)      schemic w/u (cath) pending 02/01/16 IV amiodarone stopped secondary to bradycardia, 2nd degree AVBlock/wenckebach with variable conduction, 2:1 in the 30's noted, and prolonged QT in environment of K+ 3.0      WCT reported 5/13 PM felt to have been AF by notes and placed back on amio gtt.  Tele reviewed, irregular 120's-150s'     BB re-started 02/06/16      Extubated 02/06/16  Await cardiac cath for device decision/timing Will review telemetry further with Dr. Caryl Comes, [facility] does not look like VT, ST with APCs vs PAFib  3. Pneumonia     Extubated yesterday, 02/06/16     speech therapy has seen patient, aphonia s/p prolonged intibation.  Patient denies any throat pain     On abx     CCM following  3. Acute on chronic renal insuff.     Creat trending down      4. Aortic root dilation     Aortic insuff is worse, >> Severe on echo here     cardiology note states is tri leaflet     Further as per primary cardiology team     Notes mention CT when renal function allows     BP stable at this time      5. AFib (described as persistent in notes)     SR currently     CHA2DS2Vasc is at least 2 on Eliquis out patient   6. HTN     Off pressors and resumed on coreg, up-titrated today   Signed, Tommye Standard, PA-C 02/07/2016 10:26 AM  Cardiac Arrest--docuimented VF  Cardiomyopathy  Presumed nonischemic  Atrial fib long term persistent  Now in sinus with recurrent AFib with RVR mislabeled as VT  LBBB  HTN poorly controlled  Acute chronic renal insuffic Cr>2.0   Pneumonia  Wenckebach and 2 :1 block on  amiodarone  Patient with cardiomyopathy presumed nonischemic of long-standing. Cardiac arrest with AED advised shock and electrogram review confirming ventricular fibrillation. He will need an ICD for secondary prevention. He also has some degree of congestive failure and has left bundle branch block. He will need CRT-D at the time of device implantation.  Prior to this however he will need catheterization once his renal function normalizes to see if there is any target for revascularization; he did not have an acute MI with this event. There is no indication for delay for ICD implantation not withstanding catheterization results  He is currently intravascularly depleted based on his laboratories. Would consider hydration.  He has  long-standing persistent atrial fibrillation although is currently in sinus rhythm. It might be reasonable to consider antiarrhythmic therapy to help maintain sinus rhythm. The issue of anticoagulation will be tricky would anticipate continuing his heparin and discontinuing a prior to device implantation with subsequent initiation of his apixaban  We will also likely struggle with rate control until he is paced given the conduction abnormality seen early. They may have been enhanced by hypothermia.

## 2016-02-07 NOTE — Progress Notes (Signed)
S: Called to bedside to assess patient for left hip pain.  RN report difficulty with standing / getting to chair.   O:  Blood pressure 175/89, pulse 100, temperature 100 F (37.8 C), temperature source Core (Comment), resp. rate 23, height 5\' 9"  (1.753 m), weight 237 lb 10.5 oz (107.8 kg), SpO2 100 %.  Exam: The patient is awake, alert.  Follows commands appropriately.  Symmetrical facial movement, normal smile, no facial droop, equal arm strength, able to lift against gravity, grip strength equal, able to resist and hold arms up with pressure.  Speech clear / hoarse voice.  Able to lift RLE against gravity.  Difficulty with lifting LLE against gravity due to pain.  Equal plantar flexion.     A:  Left hip pain Left low back pain  Mechanical fall with CPR - pt was removed from car for CPR, ? Injury with removal  P: Assess plain films of left hip Assess lumbar spine plain films PT following PRN percocet for pain  Noe Gens, NP-C [facility] Pgr: [phone] or if no answer [phone] 02/07/2016, 3:09 PM

## 2016-02-07 NOTE — Progress Notes (Signed)
[employer] for Heparin Indication: atrial fibrillation  Allergies  Allergen Reactions  . Lactose Intolerance (Gi) Diarrhea    Patient Measurements: Height: 5\' 9"  (175.3 cm) Weight: 237 lb 10.5 oz (107.8 kg) IBW/kg (Calculated) : 70.7 Heparin Dosing Weight: 95kg   Vital Signs: Temp: 98.7 F (37.1 C) (05/15 2000) Temp Source: Oral (05/15 2000) BP: 105/72 mmHg (05/15 2200) Pulse Rate: 94 (05/15 2200)  Labs:  Recent Labs  02/05/16 1522 02/06/16 0509  02/06/16 1630 02/07/16 0415 02/07/16 1454 02/07/16 2145  HGB 13.0 12.4*  --   --  13.8  --   --   HCT 40.0 36.8*  --   --  41.0  --   --   PLT 229 215  --   --  237  --   --   APTT  --   --   --  85* 85*  --   --   HEPARINUNFRC  --   --   < > 0.92* 0.98* 0.96* 0.96*  CREATININE 2.31* 2.06*  --   --  2.24*  --   --   TROPONINI 0.08*  --   --   --   --   --   --   < > = values in this interval not displayed.  Estimated Creatinine Clearance: 40.3 mL/min (by C-G formula based on Cr of 2.24).  Assessment: 65 y.o. male admitted s/p cardiac arrest, h/o Afib and Eliquis on hold, for heparin .   Goal of Therapy:  Heparin level 0.3-0.7 units/ml Monitor platelets by anticoagulation protocol: Yes   Plan:  Hold heparin x 1 hour, then decrease heparin 800 units/hr Follow-up am labs.  Phillis Knack, PharmD, BCPS

## 2016-02-07 NOTE — Progress Notes (Signed)
Removed aline at this time. Manual pressure held for 15 mintues. No active bleeding. Dressed site with gauze and pressure dressing.

## 2016-02-08 ENCOUNTER — Encounter (HOSPITAL_COMMUNITY)
Admission: EM | Disposition: A | Discharge status: Rehabilitation Facility | Payer: Self-pay | Source: Home / Self Care | Attending: Emergency Medicine

## 2016-02-08 ENCOUNTER — Encounter (HOSPITAL_COMMUNITY): Payer: Self-pay | Admitting: *Deleted

## 2016-02-08 ENCOUNTER — Inpatient Hospital Stay (HOSPITAL_COMMUNITY): Payer: BC Managed Care – PPO

## 2016-02-08 DIAGNOSIS — I351 Nonrheumatic aortic (valve) insufficiency: Secondary | ICD-10-CM

## 2016-02-08 DIAGNOSIS — J9600 Acute respiratory failure, unspecified whether with hypoxia or hypercapnia: Secondary | ICD-10-CM | POA: Diagnosis not present

## 2016-02-08 DIAGNOSIS — I5022 Chronic systolic (congestive) heart failure: Secondary | ICD-10-CM | POA: Diagnosis not present

## 2016-02-08 DIAGNOSIS — I712 Thoracic aortic aneurysm, without rupture: Secondary | ICD-10-CM

## 2016-02-08 DIAGNOSIS — I469 Cardiac arrest, cause unspecified: Secondary | ICD-10-CM | POA: Diagnosis not present

## 2016-02-08 DIAGNOSIS — I4901 Ventricular fibrillation: Secondary | ICD-10-CM

## 2016-02-08 HISTORY — PX: TEE WITHOUT CARDIOVERSION: SHX5443

## 2016-02-08 LAB — BASIC METABOLIC PANEL
ANION GAP: 12 (ref 5–15)
BUN: 58 mg/dL — ABNORMAL HIGH (ref 6–20)
CALCIUM: 9.2 mg/dL (ref 8.9–10.3)
CHLORIDE: 107 mmol/L (ref 101–111)
CO2: 25 mmol/L (ref 22–32)
Creatinine, Ser: 2.34 mg/dL — ABNORMAL HIGH (ref 0.61–1.24)
GFR, EST AFRICAN AMERICAN: 32 mL/min — AB (ref >60)
GFR, EST NON AFRICAN AMERICAN: 28 mL/min — AB (ref >60)
GLUCOSE: 147 mg/dL — AB (ref 65–99)
POTASSIUM: 3.9 mmol/L (ref 3.5–5.1)
Sodium: 144 mmol/L (ref 135–145)

## 2016-02-08 LAB — GLUCOSE, CAPILLARY
GLUCOSE-CAPILLARY: 138 mg/dL — AB (ref 65–99)
GLUCOSE-CAPILLARY: 166 mg/dL — AB (ref 65–99)
GLUCOSE-CAPILLARY: 139 mg/dL — AB (ref 65–99)
Glucose-Capillary: 108 mg/dL — ABNORMAL HIGH (ref 65–99)
Glucose-Capillary: 134 mg/dL — ABNORMAL HIGH (ref 65–99)
Glucose-Capillary: 136 mg/dL — ABNORMAL HIGH (ref 65–99)
Glucose-Capillary: 146 mg/dL — ABNORMAL HIGH (ref 65–99)

## 2016-02-08 LAB — CBC
HEMATOCRIT: 34.6 % — AB (ref 39.0–52.0)
HEMOGLOBIN: 11.5 g/dL — AB (ref 13.0–17.0)
MCH: 31.7 pg (ref 26.0–34.0)
MCHC: 33.2 g/dL (ref 30.0–36.0)
MCV: 95.3 fL (ref 78.0–100.0)
Platelets: 237 10*3/uL (ref 150–400)
RBC: 3.63 MIL/uL — AB (ref 4.22–5.81)
RDW: 14.5 % (ref 11.5–15.5)
WBC: 13.4 10*3/uL — AB (ref 4.0–10.5)

## 2016-02-08 LAB — HEPARIN LEVEL (UNFRACTIONATED)
HEPARIN UNFRACTIONATED: 0.53 [IU]/mL (ref 0.30–0.70)
Heparin Unfractionated: 0.55 IU/mL (ref 0.30–0.70)

## 2016-02-08 SURGERY — ECHOCARDIOGRAM, TRANSESOPHAGEAL
Anesthesia: Moderate Sedation

## 2016-02-08 MED ORDER — DEXTROSE 5 % IV SOLN
1.0000 g | INTRAVENOUS | Status: AC
  Administered 2016-02-09: 1 g via INTRAVENOUS
  Filled 2016-02-08 (×2): qty 10

## 2016-02-08 MED ORDER — BUTAMBEN-TETRACAINE-BENZOCAINE 2-2-14 % EX AERO
INHALATION_SPRAY | CUTANEOUS | Status: DC | PRN
  Administered 2016-02-08: 2 via TOPICAL

## 2016-02-08 MED ORDER — SODIUM CHLORIDE 0.9 % IV SOLN: INTRAVENOUS | Status: DC

## 2016-02-08 MED ORDER — FENTANYL CITRATE (PF) 100 MCG/2ML IJ SOLN
INTRAMUSCULAR | Status: DC | PRN
  Administered 2016-02-08: 25 ug via INTRAVENOUS

## 2016-02-08 MED ORDER — AMIODARONE HCL 200 MG PO TABS
400.0000 mg | ORAL_TABLET | Freq: Every day | ORAL | Status: DC
  Administered 2016-02-08: 400 mg via ORAL
  Filled 2016-02-08 (×2): qty 2

## 2016-02-08 MED ORDER — CARVEDILOL 25 MG PO TABS
25.0000 mg | ORAL_TABLET | Freq: Two times a day (BID) | ORAL | Status: DC
  Administered 2016-02-08 – 2016-02-09 (×2): 25 mg via ORAL
  Filled 2016-02-08 (×2): qty 1

## 2016-02-08 MED ORDER — FENTANYL CITRATE (PF) 100 MCG/2ML IJ SOLN
INTRAMUSCULAR | Status: AC
  Filled 2016-02-08: qty 2

## 2016-02-08 MED ORDER — MIDAZOLAM HCL 10 MG/2ML IJ SOLN
INTRAMUSCULAR | Status: DC | PRN
Start: 2016-02-08 — End: 2016-02-08
  Administered 2016-02-08: 2 mg via INTRAVENOUS
  Administered 2016-02-08 (×2): 1 mg via INTRAVENOUS

## 2016-02-08 MED ORDER — MIDAZOLAM HCL 5 MG/ML IJ SOLN
INTRAMUSCULAR | Status: AC
  Filled 2016-02-08: qty 2

## 2016-02-08 MED ORDER — DIPHENHYDRAMINE HCL 50 MG/ML IJ SOLN
INTRAMUSCULAR | Status: AC
Start: 2016-02-08 — End: 2016-02-08
  Filled 2016-02-08: qty 1

## 2016-02-08 NOTE — Progress Notes (Signed)
Pharmacy Antibiotic Note  Eric [name] is a 65 y.o. male admitted on 01/31/2016 s/p cardiac arrest, now with pneumonia.  Pharmacy was consulted for vancomycin/zosyn dosing. The patient has had 6 days of treatment with antibiotics. Sputum culture grew pan sensitive citrobacter koseri. Spoke with Dr. Vaughan Browner who agreed to narrow to ceftriaxone to complete a total of 7 days antibiotic treatment for PNA.  Plan: Discontinue Zosyn Ceftriaxone 1 g IV q24h through 02/09/16 Pharmacy will sign off   Height: 5\' 9"  (175.3 cm) Weight: 237 lb 10.5 oz (107.8 kg) IBW/kg (Calculated) : 70.7  Temp (24hrs), Avg:98 F (36.7 C), Min:97.5 F (36.4 C), Max:98.7 F (37.1 C)   Recent Labs Lab 02/05/16 0653 02/05/16 1522 02/06/16 0509 02/07/16 0415 02/08/16 0418  WBC 8.1 11.5* 9.0 11.1* 13.4*  CREATININE 2.17* 2.31* 2.06* 2.24* 2.34*    Estimated Creatinine Clearance: 38.6 mL/min (by C-G formula based on Cr of 2.34).    Allergies  Allergen Reactions  . Lactose Intolerance (Gi) Diarrhea    Antimicrobials this admission: 5/11 zosyn >> 5/15 5/11 vancomycin >> 5/16 5/16 Ceftriaxone   Microbiology results: 5/12 resp cx > Citrobacter koseri 5/9 blood x2 > ngtd  5/8 urine > neg  5/9 MRSA PCR negative  5/12 blood x2 > ngtd  Thank you for allowing pharmacy to be a part of this patient's care.  Joya San, PharmD Clinical Pharmacy Resident Pager # [phone] 02/08/2016 10:36 AM

## 2016-02-08 NOTE — Progress Notes (Signed)
[facility] for Heparin Indication: atrial fibrillation  Allergies  Allergen Reactions  . Lactose Intolerance (Gi) Diarrhea    Patient Measurements: Height: 5\' 9"  (175.3 cm) Weight: 237 lb 10.5 oz (107.8 kg) IBW/kg (Calculated) : 70.7 Heparin Dosing Weight: 95kg   Vital Signs: Temp: 97.9 F (36.6 C) (05/16 0729) Temp Source: Oral (05/16 0729) BP: 138/81 mmHg (05/16 0800) Pulse Rate: 92 (05/16 0800)  Labs:  Recent Labs  02/05/16 1522 02/06/16 0509  02/06/16 1630 02/07/16 0415 02/07/16 1454 02/07/16 2145 02/08/16 0418 02/08/16 0830  HGB 13.0 12.4*  --   --  13.8  --   --  11.5*  --   HCT 40.0 36.8*  --   --  41.0  --   --  34.6*  --   PLT 229 215  --   --  237  --   --  237  --   APTT  --   --   --  85* 85*  --   --   --   --   HEPARINUNFRC  --   --   < > 0.92* 0.98* 0.96* 0.96*  --  0.53  CREATININE 2.31* 2.06*  --   --  2.24*  --   --  2.34*  --   TROPONINI 0.08*  --   --   --   --   --   --   --   --   < > = values in this interval not displayed.  Estimated Creatinine Clearance: 38.6 mL/min (by C-G formula based on Cr of 2.34).  Assessment: 65 y.o. male admitted s/p cardiac arrest, h/o Afib with Eliquis PTA (last dose 5/8). Pt had hemoptysis and hematuria while on heparin therefore heparin held on 5/12, restarted 5/14. HL is now therapeutic.   Goal of Therapy:  Heparin level 0.3-0.7 units/ml Monitor platelets by anticoagulation protocol: Yes   Plan:  Continue heparin at 800 units/hr F/u 6 hour heparin level Daily HL/CBC Monitor for s/sx bleeding  Joya San, PharmD Clinical Pharmacy Resident Pager # [phone] 02/08/2016 9:34 AM

## 2016-02-08 NOTE — CV Procedure (Signed)
See full TEE report in [employer]; pt sedated with versed 4 mg and fentanyl 25 micrograms IV. Moderate to severe LV dysfunction; severely dilated aortic root (52 mm); trileaflet aortic valve with moderate (3+) AI. Kirk Ruths

## 2016-02-08 NOTE — H&P (View-Only) (Signed)
Patient Name: Eric [name] Date of Encounter: 02/08/2016  Principal Problem:   Cardiac arrest [facility]) Active Problems:   Chronic systolic heart failure ([place])   Hyperlipidemia   Left bundle branch block   Obesity (BMI 30-39.9)   Atrial flutter ([place])   Acute respiratory failure ([place])   Encounter for central line placement   Arrhythmia   Altered mental status   HCAP (healthcare-associated pneumonia)   AKI (acute kidney injury) ([place])   Length of Stay: 7  SUBJECTIVE  Alert. Oriented, still very hoarse. Denies dyspnea. Has back pain. Despite net positive fluid balane, UO is poor and renal function slightly worse.  CURRENT MEDS . antiseptic oral rinse  7 mL Mouth Rinse BID  . atorvastatin  20 mg Oral q1800  . carvedilol  12.5 mg Oral BID WC  . docusate sodium  100 mg Oral BID  . insulin aspart  2-6 Units Subcutaneous Q4H  . levothyroxine  75 mcg Oral QAC breakfast  . pantoprazole sodium  40 mg Per Tube Daily  . piperacillin-tazobactam (ZOSYN)  IV  3.375 g Intravenous Q8H  . polyethylene glycol  17 g Oral Daily  . sodium chloride flush  10-40 mL Intracatheter Q12H    OBJECTIVE   Intake/Output Summary (Last 24 hours) at 02/08/16 0815 Last data filed at 02/08/16 0500  Gross per 24 hour  Intake  657.7 ml  Output    550 ml  Net  107.7 ml   Filed Weights   02/05/16 0200 02/06/16 0428 02/07/16 0411  Weight: 113 kg (249 lb 1.9 oz) 111.4 kg (245 lb 9.5 oz) 107.8 kg (237 lb 10.5 oz)    PHYSICAL EXAM Filed Vitals:   02/08/16 0300 02/08/16 0400 02/08/16 0500 02/08/16 0729  BP: 139/78 139/78 133/93 121/87  Pulse: 97 95 92 119  Temp:  98 F (36.7 C)  97.9 F (36.6 C)  TempSrc:  Oral  Oral  Resp: 22 14 14 13   Height:      Weight:      SpO2: 100% 100% 100% 100%   General: Alert, oriented x3, no distress, very hoarse Head: no evidence of trauma, PERRL, EOMI, no exophtalmos or lid lag, no myxedema, no xanthelasma; normal ears, nose and oropharynx Neck: 5-6 cm elevation  in jugular venous pulsations and no hepatojugular reflux; brisk carotid pulses without delay and no carotid bruits Chest: clear to auscultation, no signs of consolidation by percussion or palpation, normal fremitus, symmetrical and full respiratory excursions Cardiovascular: normal position and quality of the apical impulse, irregular rhythm, normal first and second heart sounds, no rubs or gallops, barely audible diastolic murmur Abdomen: no tenderness or distention, no masses by palpation, no abnormal pulsatility or arterial bruits, normal bowel sounds, no hepatosplenomegaly Extremities: no clubbing, cyanosis or edema; 2+ radial, ulnar and brachial pulses bilaterally; 2+ right femoral, posterior tibial and dorsalis pedis pulses; 2+ left femoral, posterior tibial and dorsalis pedis pulses; no subclavian or femoral bruits Neurological: grossly nonfocal  LABS  CBC  Recent Labs  02/07/16 0415 02/08/16 0418  WBC 11.1* 13.4*  HGB 13.8 11.5*  HCT 41.0 34.6*  MCV 94.3 95.3  PLT 237 123XX123   Basic Metabolic Panel  Recent Labs  02/05/16 1522  02/07/16 0415 02/08/16 0418  NA 143  < > 142 144  K 4.9  < > 4.1 3.9  CL 112*  < > 107 107  CO2 20*  < > 24 25  GLUCOSE 185*  < > 148* 147*  BUN 52*  < >  50* 58*  CREATININE 2.31*  < > 2.24* 2.34*  CALCIUM 9.5  < > 9.1 9.2  MG 2.4  --   --   --   PHOS 3.2  --   --   --   < > = values in this interval not displayed. Liver Function Tests No results for input(s): AST, ALT, ALKPHOS, BILITOT, PROT, ALBUMIN in the last 72 hours. No results for input(s): LIPASE, AMYLASE in the last 72 hours. Cardiac Enzymes  Recent Labs  02/05/16 1522  TROPONINI 0.08*    Radiology Studies Imaging results have been reviewed  TELE AF with mild RVR   ASSESSMENT AND PLAN  1. S/P cardiac arrest, presumed VT/VF VF on AED interrogation. Plan for CRT-D on this admission, secondary prevention. Normal electrolytes on admission. Moderate to severely depressed LVEF  despite chronic comprehensive medical treatment.  2. Chronic systolic heart failure  Net even fluid balance since admission. Now with some clinical evidence of volume overload. Was on full dose carvedilol, Bidil and spironolactone prior to these events. AI was mild on TEE 2013, when EF was 25-30%, so it is unlikely the reason for his cardiomyopathy. HTN, idiopathic dilated CMP are possible etiologies, but have to exclude CAD.   3. Aortic valve regurgitation,  Mild aortic regurgitation by previous echo, severe on most recent study (was it underestimated? Did it acutely worsen?). 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 TEE today. This procedure has been fully reviewed with the patient and written informed consent has been obtained.  4. Atrial fibrillation, persistent Currently in atrial fibrillation with borderline controlled rate. Increase back to home dose of carvedilol. On IV heparin, home DOAC has been stopped. Previous consideration was given to ablation for flutter, but not felt to be helpful. Try to keep on IV heparin until after cath and ICD implantation.   5. Hypertension, accelerated Better after increased carvedilol.  6. Aortic root enlargement (HCC) Will need CT Angio when renal function is clearly stabilized.  7. Acute renal failure on CKD (chronic kidney disease), stage III-IV  Creatinine again a little worse today. Hold diuretics. Delay cath until renal function shows signs of improvement. Consider Thursday with Dr. Tamala Julian.  8. Left bundle branch block typical LBBB with QRS duration 181ms predicts excellent response to CRT, especially if he has nonischemic CMP.  9. Hyperlipidemia with good recent LDL  10. Possible aspiration pneumonia, GNR in sputum. On ABx. Swallow study with fair results.   Sanda Klein, MD, [facility] [phone] office [phone] pager 02/08/2016 8:15 AM

## 2016-02-08 NOTE — Progress Notes (Signed)
Patient Name: Eric [name] Date of Encounter: 02/08/2016  Principal Problem:   Cardiac arrest [facility]) Active Problems:   Chronic systolic heart failure ([place])   Hyperlipidemia   Left bundle branch block   Obesity (BMI 30-39.9)   Atrial flutter ([place])   Acute respiratory failure ([place])   Encounter for central line placement   Arrhythmia   Altered mental status   HCAP (healthcare-associated pneumonia)   AKI (acute kidney injury) ([place])   Length of Stay: 7  SUBJECTIVE  Alert. Oriented, still very hoarse. Denies dyspnea. Has back pain. Despite net positive fluid balane, UO is poor and renal function slightly worse.  CURRENT MEDS . antiseptic oral rinse  7 mL Mouth Rinse BID  . atorvastatin  20 mg Oral q1800  . carvedilol  12.5 mg Oral BID WC  . docusate sodium  100 mg Oral BID  . insulin aspart  2-6 Units Subcutaneous Q4H  . levothyroxine  75 mcg Oral QAC breakfast  . pantoprazole sodium  40 mg Per Tube Daily  . piperacillin-tazobactam (ZOSYN)  IV  3.375 g Intravenous Q8H  . polyethylene glycol  17 g Oral Daily  . sodium chloride flush  10-40 mL Intracatheter Q12H    OBJECTIVE   Intake/Output Summary (Last 24 hours) at 02/08/16 0815 Last data filed at 02/08/16 0500  Gross per 24 hour  Intake  657.7 ml  Output    550 ml  Net  107.7 ml   Filed Weights   02/05/16 0200 02/06/16 0428 02/07/16 0411  Weight: 113 kg (249 lb 1.9 oz) 111.4 kg (245 lb 9.5 oz) 107.8 kg (237 lb 10.5 oz)    PHYSICAL EXAM Filed Vitals:   02/08/16 0300 02/08/16 0400 02/08/16 0500 02/08/16 0729  BP: 139/78 139/78 133/93 121/87  Pulse: 97 95 92 119  Temp:  98 F (36.7 C)  97.9 F (36.6 C)  TempSrc:  Oral  Oral  Resp: 22 14 14 13   Height:      Weight:      SpO2: 100% 100% 100% 100%   General: Alert, oriented x3, no distress, very hoarse Head: no evidence of trauma, PERRL, EOMI, no exophtalmos or lid lag, no myxedema, no xanthelasma; normal ears, nose and oropharynx Neck: 5-6 cm elevation  in jugular venous pulsations and no hepatojugular reflux; brisk carotid pulses without delay and no carotid bruits Chest: clear to auscultation, no signs of consolidation by percussion or palpation, normal fremitus, symmetrical and full respiratory excursions Cardiovascular: normal position and quality of the apical impulse, irregular rhythm, normal first and second heart sounds, no rubs or gallops, barely audible diastolic murmur Abdomen: no tenderness or distention, no masses by palpation, no abnormal pulsatility or arterial bruits, normal bowel sounds, no hepatosplenomegaly Extremities: no clubbing, cyanosis or edema; 2+ radial, ulnar and brachial pulses bilaterally; 2+ right femoral, posterior tibial and dorsalis pedis pulses; 2+ left femoral, posterior tibial and dorsalis pedis pulses; no subclavian or femoral bruits Neurological: grossly nonfocal  LABS  CBC  Recent Labs  02/07/16 0415 02/08/16 0418  WBC 11.1* 13.4*  HGB 13.8 11.5*  HCT 41.0 34.6*  MCV 94.3 95.3  PLT 237 123XX123   Basic Metabolic Panel  Recent Labs  02/05/16 1522  02/07/16 0415 02/08/16 0418  NA 143  < > 142 144  K 4.9  < > 4.1 3.9  CL 112*  < > 107 107  CO2 20*  < > 24 25  GLUCOSE 185*  < > 148* 147*  BUN 52*  < >  50* 58*  CREATININE 2.31*  < > 2.24* 2.34*  CALCIUM 9.5  < > 9.1 9.2  MG 2.4  --   --   --   PHOS 3.2  --   --   --   < > = values in this interval not displayed. Liver Function Tests No results for input(s): AST, ALT, ALKPHOS, BILITOT, PROT, ALBUMIN in the last 72 hours. No results for input(s): LIPASE, AMYLASE in the last 72 hours. Cardiac Enzymes  Recent Labs  02/05/16 1522  TROPONINI 0.08*    Radiology Studies Imaging results have been reviewed  TELE AF with mild RVR   ASSESSMENT AND PLAN  1. S/P cardiac arrest, presumed VT/VF VF on AED interrogation. Plan for CRT-D on this admission, secondary prevention. Normal electrolytes on admission. Moderate to severely depressed LVEF  despite chronic comprehensive medical treatment.  2. Chronic systolic heart failure  Net even fluid balance since admission. Now with some clinical evidence of volume overload. Was on full dose carvedilol, Bidil and spironolactone prior to these events. AI was mild on TEE 2013, when EF was 25-30%, so it is unlikely the reason for his cardiomyopathy. HTN, idiopathic dilated CMP are possible etiologies, but have to exclude CAD.   3. Aortic valve regurgitation,  Mild aortic regurgitation by previous echo, severe on most recent study (was it underestimated? Did it acutely worsen?). 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 TEE today. This procedure has been fully reviewed with the patient and written informed consent has been obtained.  4. Atrial fibrillation, persistent Currently in atrial fibrillation with borderline controlled rate. Increase back to home dose of carvedilol. On IV heparin, home DOAC has been stopped. Previous consideration was given to ablation for flutter, but not felt to be helpful. Try to keep on IV heparin until after cath and ICD implantation.   5. Hypertension, accelerated Better after increased carvedilol.  6. Aortic root enlargement (HCC) Will need CT Angio when renal function is clearly stabilized.  7. Acute renal failure on CKD (chronic kidney disease), stage III-IV  Creatinine again a little worse today. Hold diuretics. Delay cath until renal function shows signs of improvement. Consider Thursday with Dr. Tamala Julian.  8. Left bundle branch block typical LBBB with QRS duration 11ms predicts excellent response to CRT, especially if he has nonischemic CMP.  9. Hyperlipidemia with good recent LDL  10. Possible aspiration pneumonia, GNR in sputum. On ABx. Swallow study with fair results.   Sanda Klein, MD, [facility] [phone] office [phone] pager 02/08/2016 8:15 AM

## 2016-02-08 NOTE — Progress Notes (Signed)
SUBJECTIVE: The patient is doing well today.  At this time, he denies chest pain, shortness of breath, or any new concerns.   Marland Kitchen antiseptic oral rinse  7 mL Mouth Rinse BID  . atorvastatin  20 mg Oral q1800  . carvedilol  12.5 mg Oral BID WC  . docusate sodium  100 mg Oral BID  . insulin aspart  2-6 Units Subcutaneous Q4H  . levothyroxine  75 mcg Oral QAC breakfast  . pantoprazole sodium  40 mg Per Tube Daily  . piperacillin-tazobactam (ZOSYN)  IV  3.375 g Intravenous Q8H  . polyethylene glycol  17 g Oral Daily  . sodium chloride flush  10-40 mL Intracatheter Q12H   . sodium chloride 250 mL (02/06/16 0603)  . amiodarone 30 mg/hr (02/08/16 0315)  . heparin 800 Units/hr (02/08/16 0030)    OBJECTIVE: Physical Exam: Filed Vitals:   02/08/16 0300 02/08/16 0400 02/08/16 0500 02/08/16 0729  BP: 139/78 139/78 133/93 121/87  Pulse: 97 95 92 119  Temp:  98 F (36.7 C)  97.9 F (36.6 C)  TempSrc:  Oral  Oral  Resp: 22 14 14 13   Height:      Weight:      SpO2: 100% 100% 100% 100%    Intake/Output Summary (Last 24 hours) at 02/08/16 0816 Last data filed at 02/08/16 0500  Gross per 24 hour  Intake  657.7 ml  Output    550 ml  Net  107.7 ml    Telemetry reveals looks like SR 90's, at times rate to 110's PAF  GEN- The patient is in NAD, alert and oriented x 3 today.   Head- normocephalic, atraumatic Eyes-  Sclera clear, conjunctiva pink Ears- hearing intact Oropharynx- clear Neck- supple Lungs- Clear to ausculation bilaterally, normal work of breathing Heart- Regular rate and rhythm, tachycardic, 2/6SM, no rubs or gallops GI- soft, NT, ND Extremities- no clubbing, cyanosis, or edema Skin- no rash or lesion Psych- euthymic mood, full affect Neuro- no gross deficits appreciated  LABS: Basic Metabolic Panel:  Recent Labs  02/05/16 1522  02/07/16 0415 02/08/16 0418  NA 143  < > 142 144  K 4.9  < > 4.1 3.9  CL 112*  < > 107 107  CO2 20*  < > 24 25  GLUCOSE 185*   < > 148* 147*  BUN 52*  < > 50* 58*  CREATININE 2.31*  < > 2.24* 2.34*  CALCIUM 9.5  < > 9.1 9.2  MG 2.4  --   --   --   PHOS 3.2  --   --   --   < > = values in this interval not displayed. Liver Function Tests: No results for input(s): AST, ALT, ALKPHOS, BILITOT, PROT, ALBUMIN in the last 72 hours. No results for input(s): LIPASE, AMYLASE in the last 72 hours. CBC:  Recent Labs  02/07/16 0415 02/08/16 0418  WBC 11.1* 13.4*  HGB 13.8 11.5*  HCT 41.0 34.6*  MCV 94.3 95.3  PLT 237 237   Cardiac Enzymes:  Recent Labs  02/05/16 1522  TROPONINI 0.08*     RADIOLOGY: Dg Lumbar Spine Complete 02/07/2016  CLINICAL DATA:  Post CPR, now with low back pain and left hip pain, inability to walk and difficulty lifting the left leg. EXAM: LUMBAR SPINE - COMPLETE 4+ VIEW COMPARISON:  None. FINDINGS: Five non rib-bearing lumbar vertebrae with anatomic alignment. Slight straightening of the usual lumbar lordosis. No fractures. Moderate disc space narrowing and associated endplate  hypertrophic changes at L4-5. Remaining disc spaces well preserved. Mild spondylosis at L3. No pars defects. No significant facet arthropathy. Visualized sacroiliac joints intact. IMPRESSION: Slight straightening of the usual lordosis which may reflect positioning and/or spasm. Moderate degenerative disc disease and spondylosis at L4-5. Electronically Signed   By: Evangeline Dakin M.D.   On: 02/07/2016 17:29    Dg Hip Unilat With Pelvis 2-3 Views Left 02/07/2016  CLINICAL DATA:  Post CPR, now with low back pain and left hip pain, inability to walk and difficulty lifting the left leg. EXAM: DG HIP (WITH OR WITHOUT PELVIS) 2-3V LEFT COMPARISON:  None. FINDINGS: No evidence of acute or subacute fracture or dislocation. Well-preserved joint space. Well preserved bone mineral density. Included AP pelvis demonstrates a normal-appearing contralateral right hip joint. Sacroiliac joints and symphysis pubis intact. Enthesopathic  spurring involving the superior iliac spines bilaterally. IMPRESSION: No acute or significant abnormalities. Electronically Signed   By: Evangeline Dakin M.D.   On: 02/07/2016 17:31    ASSESSMENT AND PLAN:  Assessment and Plan:  1. Cardiac arrest EMS record reviewed. FD first to respond applied AED and advised shock, patient received 2 shocks followed by further CPR with ROSC      AED rhythm reviewed, was VF  Intubated in ED  s/p Hypothermia protocol, rewarming completed 02/03/16   Chronic LBBB  EF now 30-35% (40-45% only 3 weeks ago)  schemic w/u (cath) pending 02/01/16 IV amiodarone stopped secondary to bradycardia, 2nd degree AVBlock/wenckebach with variable conduction, 2:1 in the 30's noted, and prolonged QT in environment of K+ 3.0   WCT reported 5/13 PM felt to have been AF by notes and placed back on amio gtt. Tele reviewed, irregular 120's-150s'  BB re-started 02/06/16  Extubated 02/06/16  Await cardiac cath for device decision/timing Continue amio gtt for now, bradycardia/heart block 5/9 in the environment of cooling and electrolyte imbalance.  QT felt stable by Dr. Caryl Comes  3. Pneumonia  Extubated yesterday, 02/06/16  speech therapy has seen patient, aphonia s/p prolonged intibation, a little better today. Patient denies any throat pain  On abx  CCM following  3. Acute on chronic renal insuff.  Creat up some today     Deferred to primary care team   4. Aortic root dilation  Aortic insuff is worse, >> Severe on echo here  cardiology note states is tri leaflet  Further as per primary cardiology team  Notes mention CT when renal function allows  BP stable   5. AFib (described as persistent in notes)  SR currently/PAF here  CHA2DS2Vasc is at least 2 on Eliquis out patient      Heparin gtt here  6. HTN  Off pressors and resumed on coreg, up-titrated yesterday     C/w primary team  Tommye Standard, PA-C 02/08/2016 8:16 AM   I have seen and examined this patient with Tommye Standard.  Agree with above, note added to reflect my findings.  On exam, tachycardic, irregular, no murmurs, lungs clear.  Had cardiac arrest, now with sinus rhythm, LBBB and depressed EF. Qualifies for CRT-D implant.  Continuing to monitor renal function to see if it improves.  If so, Tobe Kervin plan for CRT-D placement this coming Friday.  Cielle Aguila M. Alex Mcmanigal MD 02/08/2016 9:56 PM

## 2016-02-08 NOTE — Progress Notes (Signed)
Speech Language Pathology Treatment: Dysphagia  Patient Details Name: Eric [name] MRN: [id number] DOB: 1951-05-09 Today's Date: 02/08/2016 Time: [id number] SLP Time Calculation (min) (ACUTE ONLY): 20 min  Assessment / Plan / Recommendation Clinical Impression  Skilled treatment session focused on addressing dysphagia goals. SLP facilitated session by providing set-up assist for oral care, which patient completed with extra time.  Patient with audible whisper today; dysphonic and able to elicit throat clears.  SLP provided minimal trials 2 ice chips (due to being NPO for TEE), which resulted in 1-2 swallows per chip.  Patient appears ready for an objective assessment; however, given NPO status for TEE today recommend and objective swallow assessment tomorrow, 02/08/16 in the am.  Continue with current plan of care pending resulted of swallow assessment.     HPI [name] with known sCHF and A.fib, presented 05/08 after out of hospital cardiac arrest. ROSC within 15-20 minutes after defib x 2. ETT 5/8-5/14. Now with acute encephalopathy with concern for anoxia per MD notes.      SLP Plan  Continue with current plan of care     Recommendations  Objective swallow assessment 02/09/16             Oral Care Recommendations: Oral care QID;Oral care prior to ice chip/H20 Follow up Recommendations: Other (comment) (TBD) Plan: Continue with current plan of care     GO               Carmelia Roller., [place]  [place] 02/08/2016, 12:48 PM

## 2016-02-08 NOTE — Interval H&P Note (Signed)
History and Physical Interval Note:  02/08/2016 2:02 PM  Eric [name]  has presented today for surgery, with the diagnosis of aortic valve  The various methods of treatment have been discussed with the patient and family. After consideration of risks, benefits and other options for treatment, the patient has consented to  Procedure(s): TRANSESOPHAGEAL ECHOCARDIOGRAM (TEE) (N/A) as a surgical intervention .  The patient's history has been reviewed, patient examined, no change in status, stable for surgery.  I have reviewed the patient's chart and labs.  Questions were answered to the patient's satisfaction.     Kirk Ruths

## 2016-02-08 NOTE — Progress Notes (Signed)
PULMONARY / CRITICAL CARE MEDICINE   Name: Eric [name] MRN: [id number] DOB: 05-Jun-1951    ADMISSION DATE:  01/31/2016 CONSULTATION DATE:  01/31/16  REFERRING MD:  EDP  CHIEF COMPLAINT:  Cardiac Arrest  HISTORY OF PRESENT ILLNESS:   Eric [name] is a 65 y.o. male with PMH as outlined below including known systolic heart failure and A.fib on Eliquis.  He was in his USOH until evening of 05/08 when he was with his wife at [place].  As they were leaving [employer], he began driving the car and while still in the parking lot, he began to breath very heavily before becoming unresponsive.  His wife immediately got the car stopped, got him out of the car and began CPR while calling EMS.  She feels that EMS arrived within 5 - 10 minutes and upon their arrival, he received 2 defibrillations via AED.  ROSC was achieved within roughly 15 - 20 minutes.  Upon ED arrival, he remained unresponsive and was subsequently intubated.  PCCM was called for admission and consideration of hypothermia protocol.  Cardiology was also called in consultation.  Per his wife, he had not had any complaints recently and had behaved completely normal.  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  SUBJECTIVE:  Doing well post extubation. Failed swallow eval.  VITAL SIGNS: BP 138/81 mmHg  Pulse 92  Temp(Src) 98.5 F (36.9 C) (Oral)  Resp 19  Ht 5\' 9"  (1.753 m)  Wt 237 lb 10.5 oz (107.8 kg)  BMI 35.08 kg/m2  SpO2 100%  HEMODYNAMICS: CVP:  [4 mmHg-7 mmHg] 4 mmHg  VENTILATOR SETTINGS:    INTAKE / OUTPUT: I/O last 3 completed shifts: In: 3290.4 [I.V.:3090.4; IV Piggyback:200] Out: [id number]   PHYSICAL EXAMINATION: General: Awake, oriented. Speaks with a hoarse voice Neuro: Moves all four extremities. No focal deficits HEENT: NO thyromegaly, JVD Cardiovascular: S1, S2, RRR Lungs: Clear, no wheeze, crackles Abdomen: BS x 4, soft, NT, ND.   Musculoskeletal: No gross deformities, no edema Skin: No rashes.  LABS:  BMET  Recent Labs Lab 02/06/16 0509 02/07/16 0415 02/08/16 0418  NA 142 142 144  K 4.3 4.1 3.9  CL 110 107 107  CO2 22 24 25   BUN 51* 50* 58*  CREATININE 2.06* 2.24* 2.34*  GLUCOSE 150* 148* 147*    Electrolytes  Recent Labs Lab 02/02/16 0200  02/03/16 0340  02/05/16 1522 02/06/16 0509 02/07/16 0415 02/08/16 0418  CALCIUM 9.2  < > 8.6*  < > 9.5 9.1 9.1 9.2  MG 2.1  --  2.2  --  2.4  --   --   --   PHOS 3.0  --   --   --  3.2  --   --   --   < > = values in this interval not displayed.  CBC  Recent Labs Lab 02/06/16 0509 02/07/16 0415 02/08/16 0418  WBC 9.0 11.1* 13.4*  HGB 12.4* 13.8 11.5*  HCT 36.8* 41.0 34.6*  PLT 215 237 237    Coag's  Recent Labs Lab 02/04/16 0346 02/06/16 1630 02/07/16 0415  APTT 83* 85* 85*    Sepsis Markers  Recent Labs Lab 02/03/16 1135 02/04/16 0346 02/05/16 0653  PROCALCITON 1.52 1.77 0.98    ABG  Recent Labs Lab 02/03/16 0928  PHART 7.312*  PCO2ART 37.3  PO2ART 91.0    Liver Enzymes  Recent Labs Lab 02/02/16 0350  AST 75*  ALT 69*  ALKPHOS  45  BILITOT 0.9  ALBUMIN 3.5    Cardiac Enzymes  Recent Labs Lab 02/01/16 1759 02/05/16 1522  TROPONINI 0.06* 0.08*    Glucose  Recent Labs Lab 02/07/16 1603 02/07/16 2021 02/08/16 0019 02/08/16 0411 02/08/16 0749 02/08/16 1220  GLUCAP 134* 138* 108* 134* 136* 166*    Imaging Dg Lumbar Spine Complete  02/07/2016  CLINICAL DATA:  Post CPR, now with low back pain and left hip pain, inability to walk and difficulty lifting the left leg. EXAM: LUMBAR SPINE - COMPLETE 4+ VIEW COMPARISON:  None. FINDINGS: Five non rib-bearing lumbar vertebrae with anatomic alignment. Slight straightening of the usual lumbar lordosis. No fractures. Moderate disc space narrowing and associated endplate hypertrophic changes at L4-5. Remaining disc spaces well preserved. Mild spondylosis at  L3. No pars defects. No significant facet arthropathy. Visualized sacroiliac joints intact. IMPRESSION: Slight straightening of the usual lordosis which may reflect positioning and/or spasm. Moderate degenerative disc disease and spondylosis at L4-5. Electronically Signed   By: Evangeline Dakin M.D.   On: 02/07/2016 17:29   Dg Hip Unilat With Pelvis 2-3 Views Left  02/07/2016  CLINICAL DATA:  Post CPR, now with low back pain and left hip pain, inability to walk and difficulty lifting the left leg. EXAM: DG HIP (WITH OR WITHOUT PELVIS) 2-3V LEFT COMPARISON:  None. FINDINGS: No evidence of acute or subacute fracture or dislocation. Well-preserved joint space. Well preserved bone mineral density. Included AP pelvis demonstrates a normal-appearing contralateral right hip joint. Sacroiliac joints and symphysis pubis intact. Enthesopathic spurring involving the superior iliac spines bilaterally. IMPRESSION: No acute or significant abnormalities. Electronically Signed   By: Evangeline Dakin M.D.   On: 02/07/2016 17:31     STUDIES:  CXR 05/08 >>no acute process. CT head 05/08 >>no acute process Echo 5/9>>EF 30 to 35%, severe aortic regurgitation EEG 5/9>>no focal, hemispheric, or lateralizing features; demonstrated a burst/suppression pattern, which can be consistent with a sedated EEG 5/11>>mod diffuse slowing, No epileptiform activity was recorded. Hip spine films 5/15 > no acute abnormality  CULTURES: Blood 05/08 >>no growth 5/11>> Urine 05/08 >>no growth 5/10 Sputum 05/08 >>few GPC, few GNR>>> MRSA PCR 5/9>>Negative BCx2 5/12>>>  ANTIBIOTICS: Zosyn 5/11>> 5/16 Vancomycin 5/11>> 5/14 Ceftriaxone 5/16 >>  SIGNIFICANT EVENTS: 05/08 - admitted after cardiac arrest.  Started on TTM - 33 degrees C. 5/10-pt rewarmed per hypothermia protocol 5/14 >> Extubated  LINES/TUBES: ETT 05/08 > R radial a line 05/08 > Right IJ CVL 05/08 >  DISCUSSION: 65 y.o. M with known sCHF and A.fib, presented  05/08 after out of hospital cardiac arrest.  ROSC within 15-20 minutes after defib x 2.  Started on hypothermia protocol, 33 degrees celsius 5/8 rewarmed 5/10  ASSESSMENT / PLAN:  CARDIOVASCULAR A:  Cardiac arrest - VF got shocked, r/o ischemia Aortic aneurysm Hx HTN, A.fib on Eliquis, sCHF (EF 40-45% per echo from April 2017) P:  Goal MAP > 65 Restarted on heparin gtt  On Amio for wide complex rythm Cards following, appreciate the assistance 4.9 cm aortic aneurysm -- will need f/u CT of chest once extubated and Cr improved  Planned cath per cards when more stable  Continue outpatient atorvastatin Hold outpatient carvedilol, eliquis, bidil, spironolactone  NEUROLOGIC A:   Acute encephalopathy - concern for anoxia given exam findings Hx migraines, chronic lower back pain. P:   Stable post extubation  PULMONARY PNA - suspect aspiration, serratia in sputum  Mild pulmonary edema Acute hypoxic respiratory failure - in the setting of  cardiac arrest P:   Continue abx as above. Off vanc  F/U CXR  RENAL A:   AoCKD - improving slowly  Hyperkalemia-resolved P:   F/u BMP  GASTROINTESTINAL A:   GI prophylaxis. Nutrition. P:  D/C Pantoprazole as pt is off vent. Repeat Swallow eval Keep NPO  HEMATOLOGIC A:   VTE Prophylaxis. P:  SCD's  Heparin gtt  Trend CBC's and Coags Monitor for s/sx of bleeding  INFECTIOUS Aspiration Pneumonia P:   Narrow zosyn to ceftriaxone Trend WBC's, procalcitonins and monitor fever curve  ENDOCRINE A:  Hypoglycemia Hypothyroidism. P:   Continue outpatient synthroid SSI   Family updated: No family available 5/14  Marshell Garfinkel MD [facility] Pager [phone] If no answer or after 3pm call: [phone] 02/08/2016, 12:45 PM

## 2016-02-08 NOTE — Progress Notes (Signed)
Central line dressing changed. 2 RNs present at the time William Hamburger, RN and Jomarie Longs). Patient tolerated well and sterile technique maintained. Will continue to monitor.

## 2016-02-08 NOTE — Progress Notes (Signed)
Per RN, distal port blocked on central line. CVP pressure bag not used. Charge deleted.

## 2016-02-08 NOTE — Progress Notes (Signed)
RN unable to obtain CVP reading at 0800, line very difficult to flush and unable to pull blood back into chamber on CVP line.  Clip on CVP broken, requested respiratory therapy prepare a new CVP line.  When new CVP line obtained, RN disconnected previous CVP line and attempted to flush the distal port with a saline flush, RN was not able to flush the line.  Distal port of central line occluded and RN unable to obtain CVP readings, will inform CCM when MD rounds.

## 2016-02-08 NOTE — Progress Notes (Signed)
  Echocardiogram Echocardiogram Transesophageal has been performed.  Johny Chess 02/08/2016, 2:50 PM

## 2016-02-08 NOTE — Progress Notes (Signed)
[place] for Heparin Indication: atrial fibrillation  Allergies  Allergen Reactions  . Lactose Intolerance (Gi) Diarrhea    Patient Measurements: Height: 5\' 9"  (175.3 cm) Weight: 237 lb 10.5 oz (107.8 kg) IBW/kg (Calculated) : 70.7 Heparin Dosing Weight: 95kg   Vital Signs: Temp: 98.5 F (36.9 C) (05/16 1600) Temp Source: Oral (05/16 1600) BP: 125/86 mmHg (05/16 1600) Pulse Rate: 99 (05/16 1600)  Labs:  Recent Labs  02/06/16 0509  02/06/16 1630 02/07/16 0415  02/07/16 2145 02/08/16 0418 02/08/16 0830 02/08/16 1600  HGB 12.4*  --   --  13.8  --   --  11.5*  --   --   HCT 36.8*  --   --  41.0  --   --  34.6*  --   --   PLT 215  --   --  237  --   --  237  --   --   APTT  --   --  85* 85*  --   --   --   --   --   HEPARINUNFRC  --   < > 0.92* 0.98*  < > 0.96*  --  0.53 0.55  CREATININE 2.06*  --   --  2.24*  --   --  2.34*  --   --   < > = values in this interval not displayed.  Estimated Creatinine Clearance: 38.6 mL/min (by C-G formula based on Cr of 2.34).  Assessment: 65 y.o. male admitted s/p cardiac arrest, h/o Afib with Eliquis PTA (last dose 5/8). Pt had hemoptysis and hematuria while on heparin therefore heparin held on 5/12, restarted 5/14. HL is now confirmed at goal.    Goal of Therapy:  Heparin level 0.3-0.7 units/ml Monitor platelets by anticoagulation protocol: Yes   Plan:  Continue heparin at 800 units/hr Daily HL/CBC  Hildred Laser, Pharm D 02/08/2016 5:11 PM

## 2016-02-09 ENCOUNTER — Inpatient Hospital Stay (HOSPITAL_COMMUNITY): Payer: BC Managed Care – PPO

## 2016-02-09 ENCOUNTER — Encounter (HOSPITAL_COMMUNITY): Payer: Self-pay | Admitting: Cardiology

## 2016-02-09 DIAGNOSIS — I469 Cardiac arrest, cause unspecified: Secondary | ICD-10-CM | POA: Diagnosis not present

## 2016-02-09 DIAGNOSIS — I351 Nonrheumatic aortic (valve) insufficiency: Secondary | ICD-10-CM

## 2016-02-09 DIAGNOSIS — I484 Atypical atrial flutter: Secondary | ICD-10-CM | POA: Diagnosis not present

## 2016-02-09 DIAGNOSIS — I4901 Ventricular fibrillation: Secondary | ICD-10-CM | POA: Diagnosis present

## 2016-02-09 DIAGNOSIS — G9340 Encephalopathy, unspecified: Secondary | ICD-10-CM | POA: Diagnosis not present

## 2016-02-09 DIAGNOSIS — I719 Aortic aneurysm of unspecified site, without rupture: Secondary | ICD-10-CM | POA: Diagnosis present

## 2016-02-09 DIAGNOSIS — N179 Acute kidney failure, unspecified: Secondary | ICD-10-CM | POA: Diagnosis not present

## 2016-02-09 DIAGNOSIS — I472 Ventricular tachycardia: Secondary | ICD-10-CM | POA: Diagnosis not present

## 2016-02-09 DIAGNOSIS — J9601 Acute respiratory failure with hypoxia: Secondary | ICD-10-CM | POA: Diagnosis not present

## 2016-02-09 DIAGNOSIS — I711 Thoracic aortic aneurysm, ruptured: Secondary | ICD-10-CM

## 2016-02-09 HISTORY — DX: Ventricular fibrillation: I49.01

## 2016-02-09 HISTORY — DX: Aortic aneurysm of unspecified site, without rupture: I71.9

## 2016-02-09 HISTORY — DX: Nonrheumatic aortic (valve) insufficiency: I35.1

## 2016-02-09 LAB — CULTURE, BLOOD (ROUTINE X 2)
Culture: NO GROWTH
Culture: NO GROWTH

## 2016-02-09 LAB — HEPARIN LEVEL (UNFRACTIONATED): Heparin Unfractionated: 0.57 IU/mL (ref 0.30–0.70)

## 2016-02-09 LAB — BASIC METABOLIC PANEL
Anion gap: 16 — ABNORMAL HIGH (ref 5–15)
BUN: 81 mg/dL — AB (ref 6–20)
CALCIUM: 9.1 mg/dL (ref 8.9–10.3)
CHLORIDE: 108 mmol/L (ref 101–111)
CO2: 22 mmol/L (ref 22–32)
CREATININE: 2.87 mg/dL — AB (ref 0.61–1.24)
GFR calc non Af Amer: 22 mL/min — ABNORMAL LOW (ref >60)
GFR, EST AFRICAN AMERICAN: 25 mL/min — AB (ref >60)
Glucose, Bld: 138 mg/dL — ABNORMAL HIGH (ref 65–99)
Potassium: 4.4 mmol/L (ref 3.5–5.1)
SODIUM: 146 mmol/L — AB (ref 135–145)

## 2016-02-09 LAB — GLUCOSE, CAPILLARY
GLUCOSE-CAPILLARY: 136 mg/dL — AB (ref 65–99)
GLUCOSE-CAPILLARY: 167 mg/dL — AB (ref 65–99)
Glucose-Capillary: 118 mg/dL — ABNORMAL HIGH (ref 65–99)
Glucose-Capillary: 124 mg/dL — ABNORMAL HIGH (ref 65–99)
Glucose-Capillary: 156 mg/dL — ABNORMAL HIGH (ref 65–99)
Glucose-Capillary: 97 mg/dL (ref 65–99)

## 2016-02-09 LAB — CBC
HCT: 32.9 % — ABNORMAL LOW (ref 39.0–52.0)
HEMOGLOBIN: 10.7 g/dL — AB (ref 13.0–17.0)
MCH: 31 pg (ref 26.0–34.0)
MCHC: 32.5 g/dL (ref 30.0–36.0)
MCV: 95.4 fL (ref 78.0–100.0)
PLATELETS: 271 10*3/uL (ref 150–400)
RBC: 3.45 MIL/uL — ABNORMAL LOW (ref 4.22–5.81)
RDW: 14.4 % (ref 11.5–15.5)
WBC: 16.3 10*3/uL — ABNORMAL HIGH (ref 4.0–10.5)

## 2016-02-09 LAB — PHOSPHORUS: PHOSPHORUS: 4.9 mg/dL — AB (ref 2.5–4.6)

## 2016-02-09 LAB — MAGNESIUM: MAGNESIUM: 2.9 mg/dL — AB (ref 1.7–2.4)

## 2016-02-09 MED ORDER — AMIODARONE HCL 200 MG PO TABS
400.0000 mg | ORAL_TABLET | Freq: Two times a day (BID) | ORAL | Status: DC
Start: 2016-02-09 — End: 2016-02-14
  Administered 2016-02-09 – 2016-02-13 (×9): 400 mg via ORAL
  Filled 2016-02-09 (×10): qty 2

## 2016-02-09 MED ORDER — SODIUM CHLORIDE 0.9 % IV SOLN
INTRAVENOUS | Status: AC
Start: 2016-02-09 — End: 2016-02-09
  Administered 2016-02-09: 10:00:00 via INTRAVENOUS

## 2016-02-09 MED ORDER — RESOURCE THICKENUP CLEAR PO POWD
Freq: Once | ORAL | Status: AC
  Administered 2016-02-09: 20:00:00 via ORAL
  Filled 2016-02-09: qty 125

## 2016-02-09 MED ORDER — CARVEDILOL 25 MG PO TABS
37.5000 mg | ORAL_TABLET | Freq: Two times a day (BID) | ORAL | Status: DC
  Administered 2016-02-09 – 2016-02-18 (×17): 37.5 mg via ORAL
  Filled 2016-02-09 (×17): qty 1

## 2016-02-09 MED ORDER — METOPROLOL TARTRATE 5 MG/5ML IV SOLN: 5.0000 mg | INTRAVENOUS | Status: DC | PRN

## 2016-02-09 NOTE — Progress Notes (Signed)
2 RNs assisted patient to bedside commode. Patient has no strength in lower extremities. To get patient to chair, 2 RNs were needed to assist. Patient seemed worried about the motor strength in his legs at this time.  Will continue to monitor and contact PT for further evaluation. Gildardo Cranker, RN

## 2016-02-09 NOTE — Progress Notes (Signed)
[place] for Heparin Indication: atrial fibrillation  Allergies  Allergen Reactions  . Lactose Intolerance (Gi) Diarrhea    Patient Measurements: Height: 5\' 9"  (175.3 cm) Weight: 237 lb 10.5 oz (107.8 kg) IBW/kg (Calculated) : 70.7 Heparin Dosing Weight: 95kg   Vital Signs: Temp: 99.2 F (37.3 C) (05/17 0807) Temp Source: Axillary (05/17 0807) BP: 146/92 mmHg (05/17 0400) Pulse Rate: 106 (05/17 0400)  Labs:  Recent Labs  02/06/16 1630  02/07/16 0415  02/08/16 0418 02/08/16 0830 02/08/16 1600 02/09/16 0723  HGB  --   < > 13.8  --  11.5*  --   --  10.7*  HCT  --   --  41.0  --  34.6*  --   --  32.9*  PLT  --   --  237  --  237  --   --  271  APTT 85*  --  85*  --   --   --   --   --   HEPARINUNFRC 0.92*  --  0.98*  < >  --  0.53 0.55 0.57  CREATININE  --   --  2.24*  --  2.34*  --   --  2.87*  < > = values in this interval not displayed.  Estimated Creatinine Clearance: 31.4 mL/min (by C-G formula based on Cr of 2.87).  Assessment: 65 y.o. male admitted s/p cardiac arrest, h/o Afib with Eliquis PTA (last dose 5/8). Pt had hemoptysis and hematuria while on heparin therefore heparin held on 5/12, restarted 5/14. HL remains at goal.    Goal of Therapy:  Heparin level 0.3-0.7 units/ml Monitor platelets by anticoagulation protocol: Yes   Plan:  Continue heparin at 800 units/hr Daily HL/CBC Monitor for s/sx bleeding  Joya San, PharmD Clinical Pharmacy Resident Pager # [phone] 02/09/2016 9:16 AM

## 2016-02-09 NOTE — Progress Notes (Signed)
Speech Pathology:  MBSS complete. Full report located under chart review in imaging section. Start Dysphagia 3, nectar thick liquids.  Pt silently aspirated thins per MBS. Khiara Shuping L. Tivis Ringer, [place] CCC/SLP Pager [phone]

## 2016-02-09 NOTE — Progress Notes (Addendum)
Interventional Cardiology  Spoke with the patient and family concerning coronary angiography.  I have reviewed the clinical records and especially the nephrology note.  Tentatively planning to perform coronary angiography tomorrow but will need to see what impact IV hydration has on kidney function before proceeding. My guess is that cath will need to be delayed until kidney function improves.  The procedure will put him at increased risk of acute kidney injury and possible dialysis whenever it is performed. This was discussed with the patient and family in detail. The approach would be left radial with plans to only perform coronary angiography. Right heart catheterization will also be performed via antecubital vein access.

## 2016-02-09 NOTE — Progress Notes (Signed)
PULMONARY / CRITICAL CARE MEDICINE   Name: Eric [name] MRN: [id number] DOB: 12-05-1950    ADMISSION DATE:  01/31/2016 CONSULTATION DATE:  01/31/16  REFERRING MD:  EDP  CHIEF COMPLAINT:  Cardiac Arrest  HISTORY OF PRESENT ILLNESS:   Eric [name] is a 65 y.o. male with PMH as outlined below including known systolic heart failure and A.fib on Eliquis.  He was in his USOH until evening of 05/08 when he was with his wife at [place].  As they were leaving [employer], he began driving the car and while still in the parking lot, he began to breath very heavily before becoming unresponsive.  His wife immediately got the car stopped, got him out of the car and began CPR while calling EMS.  She feels that EMS arrived within 5 - 10 minutes and upon their arrival, he received 2 defibrillations via AED.  ROSC was achieved within roughly 15 - 20 minutes.  Upon ED arrival, he remained unresponsive and was subsequently intubated.  PCCM was called for admission and consideration of hypothermia protocol.  Cardiology was also called in consultation.  Per his wife, he had not had any complaints recently and had behaved completely normal.  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  SUBJECTIVE:  Doing well overnight. Failed swallow eval.  VITAL SIGNS: BP 119/69 mmHg  Pulse 74  Temp(Src) 99.1 F (37.3 C) (Axillary)  Resp 17  Ht 5\' 9"  (1.753 m)  Wt 237 lb 10.5 oz (107.8 kg)  BMI 35.08 kg/m2  SpO2 94%  HEMODYNAMICS:   VENTILATOR SETTINGS:   INTAKE / OUTPUT: I/O last 3 completed shifts: In: 905.4 [P.O.:130; I.V.:725.4; IV Piggyback:50] Out: 1230 [Urine:1230]  PHYSICAL EXAMINATION: General: Awake, oriented. Speaks with a hoarse voice Neuro: Moves all four extremities. No focal deficits HEENT: NO thyromegaly, JVD Cardiovascular: S1, S2, RRR Lungs: Clear, no wheeze, crackles Abdomen: BS x 4, soft, NT, ND.  Musculoskeletal: No gross deformities,  no edema Skin: No rashes.  LABS:  BMET  Recent Labs Lab 02/07/16 0415 02/08/16 0418 02/09/16 0723  NA 142 144 146*  K 4.1 3.9 4.4  CL 107 107 108  CO2 24 25 22   BUN 50* 58* 81*  CREATININE 2.24* 2.34* 2.87*  GLUCOSE 148* 147* 138*    Electrolytes  Recent Labs Lab 02/03/16 0340  02/05/16 1522  02/07/16 0415 02/08/16 0418 02/09/16 0723  CALCIUM 8.6*  < > 9.5  < > 9.1 9.2 9.1  MG 2.2  --  2.4  --   --   --  2.9*  PHOS  --   --  3.2  --   --   --  4.9*  < > = values in this interval not displayed.  CBC  Recent Labs Lab 02/07/16 0415 02/08/16 0418 02/09/16 0723  WBC 11.1* 13.4* 16.3*  HGB 13.8 11.5* 10.7*  HCT 41.0 34.6* 32.9*  PLT 237 237 271    Coag's  Recent Labs Lab 02/04/16 0346 02/06/16 1630 02/07/16 0415  APTT 83* 85* 85*    Sepsis Markers  Recent Labs Lab 02/03/16 1135 02/04/16 0346 02/05/16 0653  PROCALCITON 1.52 1.77 0.98    ABG  Recent Labs Lab 02/03/16 0928  PHART 7.312*  PCO2ART 37.3  PO2ART 91.0    Liver Enzymes No results for input(s): AST, ALT, ALKPHOS, BILITOT, ALBUMIN in the last 168 hours.  Cardiac Enzymes  Recent Labs Lab 02/05/16 1522  TROPONINI 0.08*    Glucose  Recent Labs Lab 02/08/16 1545 02/08/16 2023 02/09/16 0012 02/09/16 0346 02/09/16 0807 02/09/16 1147  GLUCAP 146* 139* 118* 124* 136* 167*    Imaging No results found.   STUDIES:  CXR 05/08 >>no acute process. CT head 05/08 >>no acute process Echo 5/9>>EF 30 to 35%, severe aortic regurgitation EEG 5/9>>no focal, hemispheric, or lateralizing features; demonstrated a burst/suppression pattern, which can be consistent with a sedated EEG 5/11>>mod diffuse slowing, No epileptiform activity was recorded. Hip spine films 5/15 > no acute abnormality  CULTURES: Blood 05/08 >>no growth 5/11>> Urine 05/08 >>no growth 5/10 Sputum 05/08 >>few GPC, few GNR>>> MRSA PCR 5/9>>Negative BCx2 5/12>>>  ANTIBIOTICS: Zosyn 5/11>> 5/16 Vancomycin  5/11>> 5/14 Ceftriaxone 5/16 >>  SIGNIFICANT EVENTS: 05/08 - admitted after cardiac arrest.  Started on TTM - 33 degrees C. 5/10-pt rewarmed per hypothermia protocol 5/14 >> Extubated  LINES/TUBES: ETT 05/08 > R radial a line 05/08 > Right IJ CVL 05/08 >  DISCUSSION: 65 y.o. M with known sCHF and A.fib, presented 05/08 after out of hospital cardiac arrest.  ROSC within 15-20 minutes after defib x 2.  Started on hypothermia protocol, 33 degrees celsius 5/8 rewarmed 5/10  ASSESSMENT / PLAN:  CARDIOVASCULAR A:  Cardiac arrest - VF got shocked, r/o ischemia Aortic aneurysm Hx HTN, A.fib on Eliquis, sCHF (EF 40-45% per echo from April 2017) P:  Goal MAP > 65 On heparin gtt  On Amio for wide complex rythm Cards following, appreciate the assistance 4.9 cm aortic aneurysm -- will need f/u CT of chest once extubated and Cr improved  Planned cath per cards when more stable and Cr is better Continue outpatient atorvastatin Hold outpatient carvedilol, eliquis, bidil, spironolactone  NEUROLOGIC A:   Acute encephalopathy - concern for anoxia given exam findings Hx migraines, chronic lower back pain. LE weakness and numbness P:   Check MRI lumbar spine.  PULMONARY PNA - suspect aspiration, serratia in sputum  Mild pulmonary edema Acute hypoxic respiratory failure - in the setting of cardiac arrest P:   Continue abx as above. Stop ceftriaxone tomorrow for total 7 days of therapy  RENAL A:   AKI. Worsening cr today Hyperkalemia-resolved P:   Nephrology consult  GASTROINTESTINAL A:   GI prophylaxis. Nutrition. P:  Repeat Swallow eval Keep NPO  HEMATOLOGIC A:   VTE Prophylaxis. P:  SCD's  Heparin gtt  Trend CBC's and Coags Monitor for s/sx of bleeding  INFECTIOUS Aspiration Pneumonia P:   Continue ceftriaxone (day 6/7 of abx) Trend WBC's, procalcitonins and monitor fever curve  ENDOCRINE A:  Hypoglycemia Hypothyroidism. P:   Continue outpatient  synthroid SSI   Family updated: No family available 5/14. Pt updated at bedside.  Marshell Garfinkel MD [facility] Pager [phone] If no answer or after 3pm call: [phone] 02/09/2016, 1:40 PM

## 2016-02-09 NOTE — Consult Note (Signed)
Eric [name] Admit Date: 01/31/2016 02/09/2016 Eric [name] Requesting Physician:  Eric [name]  Reason for Consult:  AoCKD HPI:  56M seen at request of Dr. Sallyanne [name] for evaluation of Acute on Chronic CKD.  Patient has known CKD 3. He follows Eric [name] at our office. Last visit was approximately one year ago. 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 It appears baseline creatinine is 1.8-2.0.  He was admitted on 5/8 after an out of hospital cardiac arrest requiring CPR and AED firing twice. He underwent therapeutic cooling and had a recovery with no obvious neurological insult.  Pertinent past history includes chronic systolic heart failure previously deemed nonischemic etiology, hypertension, chronic atrial fibrillation on anticoagulation and rate control, aortic regurgitation.  Workup in the hospital has demonstrated worsened LVEF now at 25-30%, worsened aortic insufficiency. Plan is for secondary prevention with an ICD with resynchronization therapy. He needs a left heart catheterization as well.  Renal function has been fairly stable and near his baseline for the majority of this hospital stay.  Today it was worsened to 2.87. No contrast exposures, nonsteroidals, Ace inhibitors, ARB's. He has been on vancomycin and Zosyn, both of which have been stopped. Foley catheter was removed this morning. He denies lower urinary tract symptoms. No peripheral edema. He is thirsty. Increased IV fluids were started this morning.  He complains of worsened back pain and leg pain this morning.  CREATININE, SER (mg/dL)  Date Value  02/09/2016 2.87*  02/08/2016 2.34*  02/07/2016 2.24*  02/06/2016 2.06*  02/05/2016 2.31*  02/05/2016 2.17*  02/04/2016 2.31*  02/03/2016 2.38*  02/02/2016 2.00*  02/02/2016 2.00*  ] I/Os: I/O last 3 completed shifts: In: 905.4 [P.O.:130; I.V.:725.4; IV Piggyback:50] Out: 75 [Urine:1230]  ROS Balance of 12 systems is negative w/ exceptions as above  PMH   Past Medical History  Diagnosis Date  . Claustrophobia   . Heart murmur   . Hypertension   . Varicose vein of leg     right  . Dysrhythmia     "palpitations"  . Migraine 02/13/12    "opthalmic"  . Chronic lower back pain   . Exertional dyspnea 01/2012  . CHF (congestive heart failure) (Eric [name])   . Hypothyroidism   . Chronic kidney disease     kidney fx studies increased    PSH  Past Surgical History  Procedure Laterality Date  . Skin melanocytoma excision  2012    "above left clavicle"  . Finger surgery  2012    "4th digit right hand; thumb on left hand"  . Tee without cardioversion  03/22/2012    Procedure: TRANSESOPHAGEAL ECHOCARDIOGRAM (TEE);  Surgeon: Eric Furbish, MD;  Location: [facility] ENDOSCOPY;  Service: Cardiovascular;  Laterality: N/A;  . Cardioversion  03/22/2012    Procedure: CARDIOVERSION;  Surgeon: Eric Furbish, MD;  Location: [facility];  Service: Cardiovascular;  Laterality: N/A;  . Cardioversion  04/19/2012    Procedure: CARDIOVERSION;  Surgeon: Eric Grooms, MD;  Location: [facility];  Service: Cardiovascular;  Laterality: N/A;  . Colonoscopy N/A 04/11/2013    Procedure: COLONOSCOPY;  Surgeon: Eric Castle, MD;  Location: [facility] ENDOSCOPY;  Service: Endoscopy;  Laterality: N/A;  . Colonoscopy N/A 04/11/2013    Procedure: COLONOSCOPY;  Surgeon: Eric Castle, MD;  Location: [facility] ENDOSCOPY;  Service: Endoscopy;  Laterality: N/A;   FH  Family History  Problem Relation Age of Onset  . Hypertension Mother   . Heart disease Mother   . Hypertension Father   . Heart disease  Father   . Heart failure Mother   . Diabetes Mother   . Heart failure Father   . Heart failure Mother    SH  reports that he has never smoked. He has never used smokeless tobacco. He reports that he drinks alcohol. He reports that he does not use illicit drugs. Allergies  Allergies  Allergen Reactions  . Lactose Intolerance (Gi) Diarrhea   Home medications Prior to Admission medications    Medication Sig Start Date End Date Taking? Authorizing Provider  Ascorbic Acid (VITAMIN C WITH ROSE HIPS) 1000 MG tablet Take 1,000 mg by mouth daily.   Yes Historical Provider, MD  atorvastatin (LIPITOR) 20 MG tablet TAKE 1 TABLET BY MOUTH DAILY 08/31/14  Yes Eric Crome, MD  b complex vitamins tablet Take 1 tablet by mouth daily as needed (cold/ energy).    Yes Historical Provider, MD  carvedilol (COREG) 25 MG tablet TAKE 1 TABLET BY MOUTH TWICE DAILY 01/19/16  Yes Eric Crome, MD  Cholecalciferol 2000 units CAPS Take 2,000 Units by mouth 3 (three) times a week.   Yes Historical Provider, MD  Coenzyme Q10 (COQ10) 200 MG CAPS Take 200 mg by mouth daily.   Yes Historical Provider, MD  ELIQUIS 5 MG TABS tablet TAKE 1 TABLET BY MOUTH TWICE DAILY 02/10/15  Yes Eric Crome, MD  fluticasone [facility]) 50 MCG/ACT nasal spray Place 2 sprays into the nose as needed for allergies.    Yes Historical Provider, MD  furosemide (LASIX) 40 MG tablet TAKE 1 TABLET BY MOUTH DAILY 11/23/15  Yes Eric Crome, MD  isosorbide-hydrALAZINE (BIDIL) 20-37.5 MG per tablet Take 1 tablet by mouth 2 (two) times daily.   Yes Historical Provider, MD  levothyroxine (SYNTHROID, LEVOTHROID) 75 MCG tablet Take 1 tablet (75 mcg total) by mouth daily before breakfast. 08/15/13  Yes Eric Crome, MD  magnesium oxide (MAG-OX) 400 MG tablet Take 400 mg by mouth daily.   Yes Historical Provider, MD  spironolactone (ALDACTONE) 25 MG tablet TAKE 1 TABLET BY MOUTH EVERY DAY 01/19/16  Yes Eric Crome, MD    Current Medications Scheduled Meds: . amiodarone  400 mg Oral BID  . antiseptic oral rinse  7 mL Mouth Rinse BID  . atorvastatin  20 mg Oral q1800  . carvedilol  37.5 mg Oral BID WC  . cefTRIAXone (ROCEPHIN)  IV  1 g Intravenous Q24H  . docusate sodium  100 mg Oral BID  . insulin aspart  2-6 Units Subcutaneous Q4H  . levothyroxine  75 mcg Oral QAC breakfast  . polyethylene glycol  17 g Oral Daily  . sodium chloride flush   10-40 mL Intracatheter Q12H   Continuous Infusions: . sodium chloride 10 mL/hr at 02/08/16 1541  . sodium chloride 100 mL/hr at 02/09/16 1009  . heparin 800 Units/hr (02/09/16 0734)   PRN Meds:.acetaminophen, bisacodyl, bisacodyl, hydrALAZINE, labetalol, metoprolol, morphine injection, oxyCODONE-acetaminophen, sodium chloride flush  CBC  Recent Labs Lab 02/07/16 0415 02/08/16 0418 02/09/16 0723  WBC 11.1* 13.4* 16.3*  HGB 13.8 11.5* 10.7*  HCT 41.0 34.6* 32.9*  MCV 94.3 95.3 95.4  PLT 237 237 [id number]   Basic Metabolic Panel  Recent Labs Lab 02/04/16 0346 02/05/16 0653 02/05/16 1522 02/06/16 0509 02/07/16 0415 02/08/16 0418 02/09/16 0723  NA 140 143 143 142 142 144 146*  K 4.9 4.2 4.9 4.3 4.1 3.9 4.4  CL 112* 113* 112* 110 107 107 108  CO2 18* 21* 20* 22 24 25  22  GLUCOSE 111* 139* 185* 150* 148* 147* 138*  BUN 44* 48* 52* 51* 50* 58* 81*  CREATININE 2.31* 2.17* 2.31* 2.06* 2.24* 2.34* 2.87*  CALCIUM 8.5* 8.9 9.5 9.1 9.1 9.2 9.1  PHOS  --   --  3.2  --   --   --  4.9*    Physical Exam  Blood pressure 146/92, pulse 106, temperature 99.2 F (37.3 C), temperature source Axillary, resp. rate 19, height 5\' 9"  (1.753 m), weight 107.8 kg (237 lb 10.5 oz), SpO2 96 %. GEN: NAD ENT: NCAT EYES: EOMI CV: IRIR PULM: diminished in bases ABD: s/nt/nd SKIN: No rashes/lesions EXT:No LEE Nonfocal. AAOx3, CN2-12 intact  RENAL STUDIES 1. 5/17 Renal US: pending  Assessment 58M with out of hospital cardiac arrest 5/8 with AoCKD4.  1. AoCKD4 1. Could be hemodynamic / pre-renal: seehow responds to IVFs 2. No obvious nephrotoxins 3. Check CK given muscle pain  4. Renal US ordered today 5. Cont IVFs for now 6. Sees Powell at [employer], Daily Renal Panel, Strict I/Os, Avoid nephrotoxins (NSAIDs, judicious IV Contrast)  2. Cardiac Arrest 5/8: needs LHC and ICD 3. HTN: BP stable on carvediolol 4. Leg pain 5. Aortic Insufficiency 6. AFib on heparin gtt 7. Systolic  CHF 8. HCAP s/p ABX  Pearson Grippe MD [phone] pgr 02/09/2016, 10:36 AM

## 2016-02-09 NOTE — Progress Notes (Signed)
Physical Therapy Treatment Patient Details Name: Eric [name] MRN: [id number] DOB: 1951/08/02 Today's Date: 02/09/2016    History of Present Illness 65 y.o. M with known sCHF and A.fib, presented 05/08 after out of hospital cardiac arrest. ROSC within 15-20 minutes after defib x 2. ETT 5/8-5/14. Now with acute encephalopathy with concern for anoxia per MD notes.     PT Comments    Eric [name] presents w/ increased Bil LE weakness and impaired sensation (Rt LE>Lt LE), RN made aware.  Pt was unable to stand from chair w/ +2 total assist and required use of lift to assist to bed.  This is a significant change from previous PT session when pt only required +2 mod assist for a stand pivot transfer.  Pt reports Bil thigh tightness and pain, some relief w/ hot packs and pain medicine.     Follow Up Recommendations  CIR;Supervision/Assistance - 24 hour     Equipment Recommendations  Other (comment) (TBA)    Recommendations for Other Services Rehab consult     Precautions / Restrictions Precautions Precautions: Fall Restrictions Weight Bearing Restrictions: No    Mobility  Bed Mobility Overal bed mobility: Needs Assistance Bed Mobility: Rolling Rolling: Min assist         General bed mobility comments: Pt reaches for bed rail to pull himself to roll, assist needed.  Transfers Overall transfer level: Needs assistance Equipment used: Rolling walker (2 wheeled) Transfers: Sit to/from Stand Sit to Stand: +2 physical assistance;Total assist;+2 safety/equipment         General transfer comment: Unable to achieve standing.  Pt required min assist to scoot to edge of seat.  Bed pad and gait belt used to attempt sit>stand; however, pt's LEs buckle completely and pt unable to assist aside from UEs.  Pt required total assist to return to chair.  [place] utilized to assist pt back to bed.  Ambulation/Gait                 Stairs            Wheelchair Mobility     Modified Rankin (Stroke Patients Only) Modified Rankin (Stroke Patients Only) Pre-Morbid Rankin Score: No symptoms Modified Rankin: Severe disability     Balance Overall balance assessment: Needs assistance Sitting-balance support: Bilateral upper extremity supported;Feet supported Sitting balance-Leahy Scale: Poor Sitting balance - Comments: requiring UE support for balance and not following cues to sit upright.   Standing balance support: Bilateral upper extremity supported;During functional activity Standing balance-Leahy Scale: Zero Standing balance comment: Pt unable to stand w/ total assist                    Cognition Arousal/Alertness: Awake/alert Behavior During Therapy: Anxious Overall Cognitive Status: Impaired/Different from baseline Area of Impairment: Attention;Following commands;Safety/judgement;Awareness;Problem solving;Memory   Current Attention Level: Sustained Memory: Decreased short-term memory Following Commands: Follows one step commands inconsistently;Follows one step commands with increased time Safety/Judgement: Decreased awareness of safety;Decreased awareness of deficits Awareness: Intellectual Problem Solving: Slow processing;Difficulty sequencing;Requires verbal cues;Requires tactile cues;Decreased initiation General Comments: Delayed response time and had to constantly cue for maintaining posture and movment.       Exercises General Exercises - Lower Extremity Ankle Circles/Pumps: Both;10 reps;AROM;Supine Long Arc Quad: Both;10 reps;Seated;AAROM    General Comments General comments (skin integrity, edema, etc.): Decreased sensation/numbness Bil LEs (Rt>Lt).  Rt knee extension 2/5, Lt knee extension 3-/5. RN notified of changes.      Pertinent Vitals/Pain Pain Assessment: Faces Faces Pain  Scale: Hurts even more Pain Location: back and Bil thigh pain Pain Descriptors / Indicators: Aching;Constant;Discomfort;Grimacing;Guarding Pain  Intervention(s): Limited activity within patient's tolerance;Monitored during session;Repositioned;Heat applied    Home Living                      Prior Function            PT Goals (current goals can now be found in the care plan section) Acute Rehab PT Goals Patient Stated Goal: to get better PT Goal Formulation: With patient Time For Goal Achievement: 02/21/16 Potential to Achieve Goals: Good Progress towards PT goals: Not progressing toward goals - comment (due to Bil LE weakness)    Frequency  Min 3X/week    PT Plan Current plan remains appropriate    Co-evaluation             End of Session Equipment Utilized During Treatment: Gait belt Activity Tolerance: Patient limited by fatigue (impaired strength and sensation Bil LEs) Patient left: with call bell/phone within reach;in bed;with bed alarm set     Time: 1137-1209 PT Time Calculation (min) (ACUTE ONLY): 32 min  Charges:  $Therapeutic Exercise: 8-22 mins $Therapeutic Activity: 8-22 mins                    G Codes:      Eric [name] PT, DPT  Pager: [phone] Phone: [phone] 02/09/2016, 12:37 PM

## 2016-02-09 NOTE — Progress Notes (Signed)
Patient Name: Eric [name] Date of Encounter: 02/09/2016  Principal Problem:   Cardiac arrest [facility]) Active Problems:   Chronic systolic heart failure ([place])   Hyperlipidemia   Left bundle branch block   Obesity (BMI 30-39.9)   Atrial flutter ([place])   Acute respiratory failure ([place])   Encounter for central line placement   Arrhythmia   Altered mental status   HCAP (healthcare-associated pneumonia)   AKI (acute kidney injury) ([place])   Length of Stay: 8  SUBJECTIVE Complaining of back pain, voice is still hoarse.    CURRENT MEDS . amiodarone  400 mg Oral Daily  . antiseptic oral rinse  7 mL Mouth Rinse BID  . atorvastatin  20 mg Oral q1800  . carvedilol  25 mg Oral BID WC  . cefTRIAXone (ROCEPHIN)  IV  1 g Intravenous Q24H  . docusate sodium  100 mg Oral BID  . insulin aspart  2-6 Units Subcutaneous Q4H  . levothyroxine  75 mcg Oral QAC breakfast  . polyethylene glycol  17 g Oral Daily  . sodium chloride flush  10-40 mL Intracatheter Q12H    OBJECTIVE   Intake/Output Summary (Last 24 hours) at 02/09/16 0810 Last data filed at 02/09/16 0700  Gross per 24 hour  Intake 533.31 ml  Output    880 ml  Net -346.69 ml   Filed Weights   02/05/16 0200 02/06/16 0428 02/07/16 0411  Weight: 249 lb 1.9 oz (113 kg) 245 lb 9.5 oz (111.4 kg) 237 lb 10.5 oz (107.8 kg)    PHYSICAL EXAM Filed Vitals:   02/09/16 0346 02/09/16 0400 02/09/16 0500 02/09/16 0807  BP: 134/85 146/92    Pulse: 95 106    Temp: 98.8 F (37.1 C)   99.2 F (37.3 C)  TempSrc: Oral   Axillary  Resp: 23 13 19    Height:      Weight:      SpO2: 98% 96%     General: Alert, oriented x3, no distress, whispering, NAD Head: EOMI, moist mucous membranes Pulm: clear to auscultation, no crackles or rhonchi Cardiovascular: irregular rhythm, tachycardic Abdomen: no tenderness or distention, no masses by palpation, no abnormal pulsatility or arterial bruits, normal bowel sounds, no hepatosplenomegaly Extremities:  no clubbing, cyanosis or edema; Neurological: CN 2-12 grossly intact Skin: warm and dry  LABS  CBC  Recent Labs  02/07/16 0415 02/08/16 0418  WBC 11.1* 13.4*  HGB 13.8 11.5*  HCT 41.0 34.6*  MCV 94.3 95.3  PLT 237 [id number]   Basic Metabolic Panel  Recent Labs  02/08/16 0418 02/09/16 0723  NA 144 146*  K 3.9 4.4  CL 107 108  CO2 25 22  GLUCOSE 147* 138*  BUN 58* 81*  CREATININE 2.34* 2.87*  CALCIUM 9.2 9.1  MG  --  2.9*  PHOS  --  4.9*   Liver Function Tests No results for input(s): AST, ALT, ALKPHOS, BILITOT, PROT, ALBUMIN in the last 72 hours. No results for input(s): LIPASE, AMYLASE in the last 72 hours. Cardiac Enzymes No results for input(s): CKTOTAL, CKMB, CKMBINDEX, TROPONINI in the last 72 hours.  Radiology Studies Imaging results have been reviewed  TELE AF with mild RVR   ASSESSMENT AND PLAN  1. S/P cardiac arrest, presumed VT/VF VF on AED interrogation. Plan for CRT-D on this admission, secondary prevention. Normal electrolytes on admission. Moderate to severely depressed LVEF despite chronic comprehensive medical treatment.  2. Chronic systolic heart failure  Was on full dose carvedilol, Bidil and spironolactone prior  to these events. AI was mild on TEE 2013, when EF was 25-30%, so it is unlikely the reason for his cardiomyopathy. HTN, idiopathic dilated CMP are possible etiologies, but have to exclude CAD.  - TEE 5/17 reveals EF of [id number], grade 2 diastolic dysfunction  3. Aortic valve regurgitation,  Mild aortic regurgitation by previous echo, severe on most recent study (was it underestimated? Did it acutely worsen?). 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 TEE yesterday revealed severe aortic regurgitation with eccentric jet.   4. Atrial fibrillation, persistent Currently in atrial fibrillation with borderline controlled rate. On home coreg 25mg  BID. On IV heparin, home DOAC has been stopped. Previous consideration was  given to ablation for flutter, but not felt to be helpful. Try to keep on IV heparin until after cath and ICD implantation. Started on amiodarone po 400mg  qd.   5. Hypertension, accelerated Better after increased carvedilol.  6. Aortic root enlargement - aortic root measures 61mm, ascending aorta 75mm Will need CT Angio when renal function is clearly stabilized.  7. Acute renal failure on CKD (chronic kidney disease), stage III-IV   Holding diuretics due to uptrending creatinine, BMET pending. Delay cath until renal function shows signs of improvement. Consider Thursday with Dr. Tamala Julian.  8. Left bundle branch block typical LBBB with QRS duration 167ms predicts excellent response to CRT, especially if he has nonischemic CMP.  9. Hyperlipidemia with good recent LDL, goal less than 70, consider increasing to lipitor 40  10. Possible aspiration pneumonia-moderate citrobacter koseri from tracheal aspirate culture sensitive to  on rocephin  Julious Oka, MD 02/09/2016 8:10 AM  I have seen and examined the patient along with Julious Oka, MD.  I have reviewed the chart, notes and new data.  I agree with her note.  Key new complaints: back and leg pain, weak standing up Key examination changes: irregular rhythm, tachycardia Key new findings / data: TEE reviewed - moderate to severe AI and moderate to severe Ao root dilation; renal function deteriorating  PLAN: Increase amiodarone and beta blocker for better rate control. Volume status uncertain, but does not appear to be in overt CHF. Will give fluids today. Ask Nephrology to see. Will probably need to cancel plans for heart cath tomorrow. PT/speech eval ongoing. Information from cardiac cath/coronary angio is important before making decisions regarding timing of CRT- D implant.   Sanda Klein, MD, [place] [phone] 02/09/2016, 9:08 AM

## 2016-02-09 NOTE — Progress Notes (Addendum)
Patient unable to stand and assist physical therapy with transfer from chair to bed requiring PT to use lift.  Per PT, patient stated he was unable to feel his legs and when they touched patient's leg on the right thigh he was unaware that he was being touched.  RN assessed patient and patient able to feel RN touching upper and lower part of left leg and lower part of right leg but is not able to feel RN touching patient's right thigh. Informed MD Mannam.

## 2016-02-09 NOTE — Progress Notes (Signed)
Central lines are occluded with no blood return.  Called lab for morning lab draws since RN unsuccessful.  Will continue to monitor. Gildardo Cranker, RN

## 2016-02-10 ENCOUNTER — Inpatient Hospital Stay (HOSPITAL_COMMUNITY): Payer: BC Managed Care – PPO

## 2016-02-10 ENCOUNTER — Encounter (HOSPITAL_COMMUNITY)
Admission: EM | Disposition: A | Discharge status: Rehabilitation Facility | Payer: Self-pay | Source: Home / Self Care | Attending: Emergency Medicine

## 2016-02-10 DIAGNOSIS — I712 Thoracic aortic aneurysm, without rupture: Secondary | ICD-10-CM | POA: Diagnosis not present

## 2016-02-10 DIAGNOSIS — R29898 Other symptoms and signs involving the musculoskeletal system: Secondary | ICD-10-CM | POA: Diagnosis present

## 2016-02-10 DIAGNOSIS — M5441 Lumbago with sciatica, right side: Secondary | ICD-10-CM | POA: Diagnosis not present

## 2016-02-10 DIAGNOSIS — M549 Dorsalgia, unspecified: Secondary | ICD-10-CM | POA: Diagnosis present

## 2016-02-10 DIAGNOSIS — G8222 Paraplegia, incomplete: Secondary | ICD-10-CM | POA: Diagnosis not present

## 2016-02-10 DIAGNOSIS — M545 Low back pain: Secondary | ICD-10-CM | POA: Diagnosis not present

## 2016-02-10 DIAGNOSIS — I469 Cardiac arrest, cause unspecified: Secondary | ICD-10-CM | POA: Diagnosis not present

## 2016-02-10 DIAGNOSIS — R400 Somnolence: Secondary | ICD-10-CM | POA: Diagnosis not present

## 2016-02-10 DIAGNOSIS — N179 Acute kidney failure, unspecified: Secondary | ICD-10-CM | POA: Diagnosis not present

## 2016-02-10 DIAGNOSIS — J9601 Acute respiratory failure with hypoxia: Secondary | ICD-10-CM | POA: Diagnosis not present

## 2016-02-10 DIAGNOSIS — J9600 Acute respiratory failure, unspecified whether with hypoxia or hypercapnia: Secondary | ICD-10-CM | POA: Diagnosis not present

## 2016-02-10 DIAGNOSIS — I4901 Ventricular fibrillation: Secondary | ICD-10-CM | POA: Diagnosis not present

## 2016-02-10 DIAGNOSIS — I714 Abdominal aortic aneurysm, without rupture: Secondary | ICD-10-CM

## 2016-02-10 DIAGNOSIS — I480 Paroxysmal atrial fibrillation: Secondary | ICD-10-CM | POA: Diagnosis present

## 2016-02-10 LAB — BASIC METABOLIC PANEL
ANION GAP: 14 (ref 5–15)
BUN: 98 mg/dL — AB (ref 6–20)
CALCIUM: 8.6 mg/dL — AB (ref 8.9–10.3)
CO2: 21 mmol/L — AB (ref 22–32)
CREATININE: 3.15 mg/dL — AB (ref 0.61–1.24)
Chloride: 109 mmol/L (ref 101–111)
GFR calc Af Amer: 22 mL/min — ABNORMAL LOW (ref >60)
GFR, EST NON AFRICAN AMERICAN: 19 mL/min — AB (ref >60)
GLUCOSE: 121 mg/dL — AB (ref 65–99)
Potassium: 4.2 mmol/L (ref 3.5–5.1)
Sodium: 144 mmol/L (ref 135–145)

## 2016-02-10 LAB — GLUCOSE, CAPILLARY
GLUCOSE-CAPILLARY: 149 mg/dL — AB (ref 65–99)
GLUCOSE-CAPILLARY: 210 mg/dL — AB (ref 65–99)
Glucose-Capillary: 113 mg/dL — ABNORMAL HIGH (ref 65–99)
Glucose-Capillary: 132 mg/dL — ABNORMAL HIGH (ref 65–99)
Glucose-Capillary: 134 mg/dL — ABNORMAL HIGH (ref 65–99)
Glucose-Capillary: 134 mg/dL — ABNORMAL HIGH (ref 65–99)
Glucose-Capillary: 236 mg/dL — ABNORMAL HIGH (ref 65–99)

## 2016-02-10 LAB — CBC
HCT: 27.2 % — ABNORMAL LOW (ref 39.0–52.0)
HCT: 29.4 % — ABNORMAL LOW (ref 39.0–52.0)
Hemoglobin: 8.8 g/dL — ABNORMAL LOW (ref 13.0–17.0)
Hemoglobin: 9.5 g/dL — ABNORMAL LOW (ref 13.0–17.0)
MCH: 30.6 pg (ref 26.0–34.0)
MCH: 30.4 pg (ref 26.0–34.0)
MCHC: 32.4 g/dL (ref 30.0–36.0)
MCHC: 32.3 g/dL (ref 30.0–36.0)
MCV: 94.4 fL (ref 78.0–100.0)
MCV: 94.2 fL (ref 78.0–100.0)
PLATELETS: 257 10*3/uL (ref 150–400)
PLATELETS: 274 10*3/uL (ref 150–400)
RBC: 2.88 MIL/uL — AB (ref 4.22–5.81)
RBC: 3.12 MIL/uL — AB (ref 4.22–5.81)
RDW: 14.4 % (ref 11.5–15.5)
RDW: 14.5 % (ref 11.5–15.5)
WBC: 17.8 10*3/uL — ABNORMAL HIGH (ref 4.0–10.5)
WBC: 20.9 10*3/uL — ABNORMAL HIGH (ref 4.0–10.5)

## 2016-02-10 LAB — HEPATIC FUNCTION PANEL
ALT: 56 U/L (ref 17–63)
AST: 164 U/L — AB (ref 15–41)
Albumin: 2.7 g/dL — ABNORMAL LOW (ref 3.5–5.0)
Alkaline Phosphatase: 42 U/L (ref 38–126)
BILIRUBIN DIRECT: 0.2 mg/dL (ref 0.1–0.5)
BILIRUBIN TOTAL: 1.1 mg/dL (ref 0.3–1.2)
Indirect Bilirubin: 0.9 mg/dL (ref 0.3–0.9)
Total Protein: 7.6 g/dL (ref 6.5–8.1)

## 2016-02-10 LAB — URINALYSIS, ROUTINE W REFLEX MICROSCOPIC
Bilirubin Urine: NEGATIVE
GLUCOSE, UA: NEGATIVE mg/dL
Hgb urine dipstick: NEGATIVE
KETONES UR: NEGATIVE mg/dL
LEUKOCYTES UA: NEGATIVE
Nitrite: NEGATIVE
PH: 5 (ref 5.0–8.0)
Protein, ur: NEGATIVE mg/dL
Specific Gravity, Urine: 1.021 (ref 1.005–1.030)

## 2016-02-10 LAB — OCCULT BLOOD X 1 CARD TO LAB, STOOL: Fecal Occult Bld: NEGATIVE

## 2016-02-10 LAB — CK
CK TOTAL: 5788 U/L — AB (ref 49–397)
Total CK: 3457 U/L — ABNORMAL HIGH (ref 49–397)
Total CK: 5835 U/L — ABNORMAL HIGH (ref 49–397)

## 2016-02-10 LAB — MAGNESIUM: Magnesium: 2.8 mg/dL — ABNORMAL HIGH (ref 1.7–2.4)

## 2016-02-10 LAB — HEPARIN LEVEL (UNFRACTIONATED): HEPARIN UNFRACTIONATED: 0.53 [IU]/mL (ref 0.30–0.70)

## 2016-02-10 LAB — PHOSPHORUS: Phosphorus: 5.5 mg/dL — ABNORMAL HIGH (ref 2.5–4.6)

## 2016-02-10 SURGERY — LEFT HEART CATH AND CORONARY ANGIOGRAPHY

## 2016-02-10 MED ORDER — SODIUM CHLORIDE 0.9 % IV SOLN: INTRAVENOUS | Status: DC

## 2016-02-10 MED ORDER — INSULIN ASPART 100 UNIT/ML ~~LOC~~ SOLN
0.0000 [IU] | Freq: Three times a day (TID) | SUBCUTANEOUS | Status: DC
Start: 2016-02-11 — End: 2016-02-22
  Administered 2016-02-11: 2 [IU] via SUBCUTANEOUS
  Administered 2016-02-11: 3 [IU] via SUBCUTANEOUS
  Administered 2016-02-11 – 2016-02-12 (×2): 2 [IU] via SUBCUTANEOUS
  Administered 2016-02-12: 3 [IU] via SUBCUTANEOUS
  Administered 2016-02-12: 2 [IU] via SUBCUTANEOUS
  Administered 2016-02-13: 1 [IU] via SUBCUTANEOUS
  Administered 2016-02-13: 2 [IU] via SUBCUTANEOUS
  Administered 2016-02-13: 3 [IU] via SUBCUTANEOUS
  Administered 2016-02-14: 2 [IU] via SUBCUTANEOUS
  Administered 2016-02-14: 1 [IU] via SUBCUTANEOUS
  Administered 2016-02-14 – 2016-02-15 (×2): 2 [IU] via SUBCUTANEOUS
  Administered 2016-02-15: 1 [IU] via SUBCUTANEOUS
  Administered 2016-02-15: 2 [IU] via SUBCUTANEOUS
  Administered 2016-02-16 (×3): 1 [IU] via SUBCUTANEOUS
  Administered 2016-02-18: 2 [IU] via SUBCUTANEOUS
  Administered 2016-02-18 – 2016-02-22 (×8): 1 [IU] via SUBCUTANEOUS

## 2016-02-10 MED ORDER — ENSURE ENLIVE PO LIQD
237.0000 mL | Freq: Two times a day (BID) | ORAL | Status: DC
  Administered 2016-02-10 – 2016-02-14 (×7): 237 mL via ORAL

## 2016-02-10 MED ORDER — INSULIN ASPART 100 UNIT/ML ~~LOC~~ SOLN
0.0000 [IU] | Freq: Every day | SUBCUTANEOUS | Status: DC
  Administered 2016-02-10 – 2016-02-13 (×3): 2 [IU] via SUBCUTANEOUS

## 2016-02-10 MED ORDER — SODIUM ACETATE 2 MEQ/ML IV SOLN
INTRAVENOUS | Status: AC
  Administered 2016-02-10 – 2016-02-11 (×2): via INTRAVENOUS
  Filled 2016-02-10 (×4): qty 1000

## 2016-02-10 MED ORDER — SODIUM CHLORIDE 0.9 % IV SOLN
INTRAVENOUS | Status: DC
  Administered 2016-02-10: 07:00:00 via INTRAVENOUS

## 2016-02-10 MED ORDER — ISOSORB DINITRATE-HYDRALAZINE 20-37.5 MG PO TABS
0.5000 | ORAL_TABLET | Freq: Three times a day (TID) | ORAL | Status: DC
  Administered 2016-02-10 – 2016-02-22 (×36): 0.5 via ORAL
  Filled 2016-02-10 (×36): qty 1

## 2016-02-10 NOTE — Clinical Social Work Note (Signed)
LCSW attempted to speak with patient's son, Anvith, regarding patient's potential discharge plan. LCSW unable to leave message with patient's son. LCSW will reattempt to contact patient's son.  Lubertha Sayres, [facility]: [phone] Surgical: [phone]

## 2016-02-10 NOTE — Progress Notes (Signed)
[place] for Heparin Indication: atrial fibrillation  Allergies  Allergen Reactions  . Lactose Intolerance (Gi) Diarrhea    Patient Measurements: Height: 5\' 9"  (175.3 cm) Weight: 237 lb 10.5 oz (107.8 kg) IBW/kg (Calculated) : 70.7 Heparin Dosing Weight: 95kg   Vital Signs: Temp: 98.7 F (37.1 C) (05/18 0400) Temp Source: Oral (05/18 0400) BP: 125/78 mmHg (05/18 0600) Pulse Rate: 94 (05/18 0700)  Labs:  Recent Labs  02/08/16 0418  02/08/16 1600 02/09/16 0723 02/09/16 1122 02/10/16 0250  HGB 11.5*  --   --  10.7*  --  8.8*  HCT 34.6*  --   --  32.9*  --  27.2*  PLT 237  --   --  271  --  257  HEPARINUNFRC  --   < > 0.55 0.57  --  0.53  CREATININE 2.34*  --   --  2.87*  --  3.15*  CKTOTAL  --   --   --   --  3457*  --   < > = values in this interval not displayed.  Estimated Creatinine Clearance: 28.7 mL/min (by C-G formula based on Cr of 3.15).  Assessment: 65 y.o. male admitted s/p cardiac arrest, h/o Afib with Eliquis PTA (last dose 5/8). Pt had hemoptysis and hematuria while on heparin therefore heparin held on 5/12, restarted 5/14. HL remains at goal.    Goal of Therapy:  Heparin level 0.3-0.7 units/ml Monitor platelets by anticoagulation protocol: Yes   Plan:  Continue heparin at 800 units/hr Daily HL/CBC Monitor for s/sx bleeding  Joya San, PharmD Clinical Pharmacy Resident Pager # [phone] 02/10/2016 7:29 AM

## 2016-02-10 NOTE — Consult Note (Signed)
Physical Medicine and Rehabilitation Consult Reason for Consult: Acute cephalopathy related to cardiac arrest Referring Physician: [facility]    HPI: Eric [name] is a 65 y.o. right handed male with history of hypertension, chronic renal insufficiency baseline creatinine 123456, systolic congestive heart failure, atrial fibrillation on eliquis. Patient lives with wife. Independent prior to admission. 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 Presented from outside hospital 01/31/2016 with cardiac arrest while at [facility]. Wife initiated CPR calling EMS. EMS arrived within 5-10 minutes received 2 defibrillations. Troponin 0. 11. Lactic acid 1.2. Patient was intubated. Echocardiogram ejection fraction 35% diffuse hypokinesis with grade 2 diastolic dysfunction. Initially placed on intravenous amiodarone discontinued due to bradycardia and later resumed. TEE completed with moderate to severe global reduction in LV function. No evidence of vegetation. Systolic function moderately to severely reduced. Plan for cardiac catheterization/ICD however delayed due to elevated creatinine 2.87-3.15. Renal services has been consulted. Renal ultrasound without obstruction. Presently maintained on intravenous heparin with Elliquis  on hold. Mechanical soft nectar thick liquid diet. Physical therapy evaluation has been completed with recommendations of physical medicine rehabilitation consult.  Patient states that he was ambulating with some assistance a couple days ago but starting after his modified barium swallow test he noticed some increased weakness of the lower extremities. This was also noted by physical therapy  Patient has MRI of the brain as well as MRI of the lumbar spine to further evaluate. This will be done under anesthesia due to claustrophobia Review of Systems  Constitutional: Negative for fever and chills.  HENT: Negative for hearing loss.   Eyes: Negative for  blurred vision and double vision.  Respiratory: Negative for cough.        Exertional dyspnea  Cardiovascular: Positive for palpitations and leg swelling.  Gastrointestinal: Positive for constipation. Negative for nausea and vomiting.  Genitourinary: Positive for urgency. Negative for dysuria and hematuria.  Musculoskeletal: Positive for back pain.  Neurological: Positive for headaches. Negative for weakness.  All other systems reviewed and are negative.  Past Medical History  Diagnosis Date  . Claustrophobia   . Heart murmur   . Hypertension   . Varicose vein of leg     right  . Dysrhythmia     "palpitations"  . Migraine 02/13/12    "opthalmic"  . Chronic lower back pain   . Exertional dyspnea 01/2012  . CHF (congestive heart failure) ([place])   . Hypothyroidism   . Chronic kidney disease     kidney fx studies increased    Past Surgical History  Procedure Laterality Date  . Skin melanocytoma excision  2012    "above left clavicle"  . Finger surgery  2012    "4th digit right hand; thumb on left hand"  . Tee without cardioversion  03/22/2012    Procedure: TRANSESOPHAGEAL ECHOCARDIOGRAM (TEE);  Surgeon: Candee Furbish, MD;  Location: [facility] ENDOSCOPY;  Service: Cardiovascular;  Laterality: N/A;  . Cardioversion  03/22/2012    Procedure: CARDIOVERSION;  Surgeon: Candee Furbish, MD;  Location: [place];  Service: Cardiovascular;  Laterality: N/A;  . Cardioversion  04/19/2012    Procedure: CARDIOVERSION;  Surgeon: Sinclair Grooms, MD;  Location: [place];  Service: Cardiovascular;  Laterality: N/A;  . Colonoscopy N/A 04/11/2013    Procedure: COLONOSCOPY;  Surgeon: Inda Castle, MD;  Location: [facility] ENDOSCOPY;  Service: Endoscopy;  Laterality: N/A;  . Colonoscopy N/A 04/11/2013    Procedure: COLONOSCOPY;  Surgeon: Inda Castle, MD;  Location: [facility] ENDOSCOPY;  Service: Endoscopy;  Laterality: N/A;  . Tee without cardioversion N/A 02/08/2016    Procedure: TRANSESOPHAGEAL ECHOCARDIOGRAM (TEE);   Surgeon: Lelon Perla, MD;  Location: [facility] ENDOSCOPY;  Service: Cardiovascular;  Laterality: N/A;   Family History  Problem Relation Age of Onset  . Hypertension Mother   . Heart disease Mother   . Hypertension Father   . Heart disease Father   . Heart failure Mother   . Diabetes Mother   . Heart failure Father   . Heart failure Mother    Social History:  reports that he has never smoked. He has never used smokeless tobacco. He reports that he drinks alcohol. He reports that he does not use illicit drugs. Allergies:  Allergies  Allergen Reactions  . Lactose Intolerance (Gi) Diarrhea   Medications Prior to Admission  Medication Sig Dispense Refill  . Ascorbic Acid (VITAMIN C WITH ROSE HIPS) 1000 MG tablet Take 1,000 mg by mouth daily.    Marland Kitchen atorvastatin (LIPITOR) 20 MG tablet TAKE 1 TABLET BY MOUTH DAILY 90 tablet 0  . b complex vitamins tablet Take 1 tablet by mouth daily as needed (cold/ energy).     . carvedilol (COREG) 25 MG tablet TAKE 1 TABLET BY MOUTH TWICE DAILY 60 tablet 11  . Cholecalciferol 2000 units CAPS Take 2,000 Units by mouth 3 (three) times a week.    . Coenzyme Q10 (COQ10) 200 MG CAPS Take 200 mg by mouth daily.    Marland Kitchen ELIQUIS 5 MG TABS tablet TAKE 1 TABLET BY MOUTH TWICE DAILY 60 tablet 11  . fluticasone (FLONASE) 50 MCG/ACT nasal spray Place 2 sprays into the nose as needed for allergies.     . furosemide (LASIX) 40 MG tablet TAKE 1 TABLET BY MOUTH DAILY 90 tablet 0  . isosorbide-hydrALAZINE (BIDIL) 20-37.5 MG per tablet Take 1 tablet by mouth 2 (two) times daily.    Marland Kitchen levothyroxine (SYNTHROID, LEVOTHROID) 75 MCG tablet Take 1 tablet (75 mcg total) by mouth daily before breakfast. 30 tablet 11  . magnesium oxide (MAG-OX) 400 MG tablet Take 400 mg by mouth daily.    Marland Kitchen spironolactone (ALDACTONE) 25 MG tablet TAKE 1 TABLET BY MOUTH EVERY DAY 30 tablet 11    Home: Home Living Family/patient expects to be discharged to:: Private residence Living Arrangements:  Spouse/significant other Available Help at Discharge: Family, Available 24 hours/day Type of Home: House Home Access: Stairs to enter [employer] of Steps: 3 Entrance Stairs-Rails: None Home Layout: One level Bathroom Accessibility: Yes Home Equipment: None  Functional History: Prior Function Level of Independence: Independent Comments: Pt worked Music therapist.  Functional Status:  Mobility: Bed Mobility Overal bed mobility: Needs Assistance Bed Mobility: Rolling Rolling: Min assist Supine to sit: Mod assist, +2 for physical assistance General bed mobility comments: Pt reaches for bed rail to pull himself to roll, assist needed. Transfers Overall transfer level: Needs assistance Equipment used: Rolling walker (2 wheeled) Transfer via Lift Equipment: Maxisky Transfers: Sit to/from Stand Sit to Stand: +2 physical assistance, Total assist, +2 safety/equipment Stand pivot transfers: Mod assist, +2 physical assistance General transfer comment: Unable to achieve standing.  Pt required min assist to scoot to edge of seat.  Bed pad and gait belt used to attempt sit>stand; however, pt's LEs buckle completely and pt unable to assist aside from UEs.  Pt required total assist to return to chair.  Maxisky utilized to assist pt back to bed.      ADL:  Cognition: Cognition Overall Cognitive Status: Impaired/Different from baseline Orientation Level: Oriented X4 Cognition Arousal/Alertness: Awake/alert Behavior During Therapy: Anxious Overall Cognitive Status: Impaired/Different from baseline Area of Impairment: Attention, Following commands, Safety/judgement, Awareness, Problem solving, Memory Current Attention Level: Sustained Memory: Decreased short-term memory Following Commands: Follows one step commands inconsistently, Follows one step commands with increased time Safety/Judgement: Decreased awareness of safety, Decreased awareness of deficits Awareness:  Intellectual Problem Solving: Slow processing, Difficulty sequencing, Requires verbal cues, Requires tactile cues, Decreased initiation General Comments: Delayed response time and had to constantly cue for maintaining posture and movment.     Blood pressure 137/76, pulse 97, temperature 98.2 F (36.8 C), temperature source Axillary, resp. rate 17, height 5\' 9"  (1.753 m), weight 107.8 kg (237 lb 10.5 oz), SpO2 100 %. Physical Exam  Constitutional:  Patient is sitting up in chair. Appears to be somewhat short of breath.  HENT:  Head: Normocephalic.  Eyes: EOM are normal.  Neck: Normal range of motion. Neck supple. No thyromegaly present.  Cardiovascular:  Cardiac rate controlled  Respiratory:  Decreased breath sounds at the bases  GI: Soft. Bowel sounds are normal. He exhibits no distension.  Neurological:  Mood is flat. Makes eye contact with examiner. Followed simple commands. He was able to provide his date of birth with delay in processing. Appropriate for present year and president of the [place]. Fair medical historian  Skin: Skin is warm and dry.  Motor strength is 5/5 in bilateral deltoid bicep tricep and grip2 minus at the hip flexors knee extensors ankle dorsiflexor and plantar flexor Sensation is intact to light touch and proprioception in bilateral upper and lower limbs Responses are mildly delayed,  He remembers 3/3 objects after 5 minute delay  Results for orders placed or performed during the hospital encounter of 01/31/16 (from the past 24 hour(s))  CK     Status: Abnormal   Collection Time: 02/09/16 11:22 AM  Result Value Ref Range   Total CK 3457 (H) 49 - 397 U/L  Glucose, capillary     Status: Abnormal   Collection Time: 02/09/16 11:47 AM  Result Value Ref Range   Glucose-Capillary 167 (H) 65 - 99 mg/dL   Comment 1 Capillary Specimen   Glucose, capillary     Status: Abnormal   Collection Time: 02/09/16  4:45 PM  Result Value Ref Range   Glucose-Capillary  156 (H) 65 - 99 mg/dL  Glucose, capillary     Status: None   Collection Time: 02/09/16  8:01 PM  Result Value Ref Range   Glucose-Capillary 97 65 - 99 mg/dL  Glucose, capillary     Status: Abnormal   Collection Time: 02/10/16 12:38 AM  Result Value Ref Range   Glucose-Capillary 132 (H) 65 - 99 mg/dL  Glucose, capillary     Status: Abnormal   Collection Time: 02/10/16 12:38 AM  Result Value Ref Range   Glucose-Capillary 134 (H) 65 - 99 mg/dL   Comment 1 Capillary Specimen   CBC     Status: Abnormal   Collection Time: 02/10/16  2:50 AM  Result Value Ref Range   WBC 17.8 (H) 4.0 - 10.5 K/uL   RBC 2.88 (L) 4.22 - 5.81 MIL/uL   Hemoglobin 8.8 (L) 13.0 - 17.0 g/dL   HCT 27.2 (L) 39.0 - 52.0 %   MCV 94.4 78.0 - 100.0 fL   MCH 30.6 26.0 - 34.0 pg   MCHC 32.4 30.0 - 36.0 g/dL   RDW 14.4 11.5 - 15.5 %  Platelets 257 150 - 400 K/uL  Heparin level (unfractionated)     Status: None   Collection Time: 02/10/16  2:50 AM  Result Value Ref Range   Heparin Unfractionated 0.53 0.30 - 0.70 IU/mL  Basic metabolic panel     Status: Abnormal   Collection Time: 02/10/16  2:50 AM  Result Value Ref Range   Sodium 144 135 - 145 mmol/L   Potassium 4.2 3.5 - 5.1 mmol/L   Chloride 109 101 - 111 mmol/L   CO2 21 (L) 22 - 32 mmol/L   Glucose, Bld 121 (H) 65 - 99 mg/dL   BUN 98 (H) 6 - 20 mg/dL   Creatinine, Ser 3.15 (H) 0.61 - 1.24 mg/dL   Calcium 8.6 (L) 8.9 - 10.3 mg/dL   GFR calc non Af Amer 19 (L) >60 mL/min   GFR calc Af Amer 22 (L) >60 mL/min   Anion gap 14 5 - 15  Magnesium     Status: Abnormal   Collection Time: 02/10/16  2:50 AM  Result Value Ref Range   Magnesium 2.8 (H) 1.7 - 2.4 mg/dL  Phosphorus     Status: Abnormal   Collection Time: 02/10/16  2:50 AM  Result Value Ref Range   Phosphorus 5.5 (H) 2.5 - 4.6 mg/dL  Glucose, capillary     Status: Abnormal   Collection Time: 02/10/16  4:03 AM  Result Value Ref Range   Glucose-Capillary 113 (H) 65 - 99 mg/dL  Glucose, capillary      Status: Abnormal   Collection Time: 02/10/16  8:24 AM  Result Value Ref Range   Glucose-Capillary 134 (H) 65 - 99 mg/dL   Comment 1 Capillary Specimen   Hepatic function panel     Status: Abnormal   Collection Time: 02/10/16  8:27 AM  Result Value Ref Range   Total Protein 7.6 6.5 - 8.1 g/dL   Albumin 2.7 (L) 3.5 - 5.0 g/dL   AST 164 (H) 15 - 41 U/L   ALT 56 17 - 63 U/L   Alkaline Phosphatase 42 38 - 126 U/L   Total Bilirubin 1.1 0.3 - 1.2 mg/dL   Bilirubin, Direct 0.2 0.1 - 0.5 mg/dL   Indirect Bilirubin 0.9 0.3 - 0.9 mg/dL  CK     Status: Abnormal   Collection Time: 02/10/16  8:27 AM  Result Value Ref Range   Total CK 5788 (H) 49 - 397 U/L   US Renal  02/09/2016  CLINICAL DATA:  Acute kidney injury. History of hypertension and chronic kidney disease. EXAM: RENAL / URINARY TRACT ULTRASOUND COMPLETE COMPARISON:  None. FINDINGS: Right Kidney: Length: 11.4 cm. Diffusely increased parenchymal echogenicity. No mass or hydronephrosis visualized. Left Kidney: Length: 12.9 cm. Diffusely increased parenchymal echogenicity. No mass or hydronephrosis visualized. Bladder: Appears normal for degree of bladder distention. IMPRESSION: Normal size kidneys with diffusely increased parenchymal echogenicity, consistent with medical renal disease. No evidence of hydronephrosis. Electronically Signed   By: Earle Gell M.D.   On: 02/09/2016 13:52   Dg Chest Port 1 View  02/10/2016  CLINICAL DATA:  Shortness of breath and leukocytosis. EXAM: PORTABLE CHEST 1 VIEW COMPARISON:  02/07/2016 FINDINGS: Lungs are adequately inflated without consolidation or effusion. Mild stable cardiomegaly. Minimal degenerative change of the spine. IMPRESSION: No acute cardiopulmonary disease. Mild stable cardiomegaly. Electronically Signed   By: Marin Olp M.D.   On: 02/10/2016 09:54   Dg Swallowing Func-speech Pathology  02/09/2016  Objective Swallowing Evaluation: Type of Study: MBS-Modified Barium Swallow Study  Patient  Details Name: Jamicheal Glunz MRN: [id number] Date of Birth: 04-Jul-1951 Today's Date: 02/09/2016 Time: SLP Start Time (ACUTE ONLY): 1340-SLP Stop Time (ACUTE ONLY): 1404 SLP Time Calculation (min) (ACUTE ONLY): 24 min Past Medical History: Past Medical History Diagnosis Date . Claustrophobia  . Heart murmur  . Hypertension  . Varicose vein of leg    right . Dysrhythmia    "palpitations" . Migraine 02/13/12   "opthalmic" . Chronic lower back pain  . Exertional dyspnea 01/2012 . CHF (congestive heart failure) ([place])  . Hypothyroidism  . Chronic kidney disease    kidney fx studies increased  Past Surgical History: Past Surgical History Procedure Laterality Date . Skin melanocytoma excision  2012   "above left clavicle" . Finger surgery  2012   "4th digit right hand; thumb on left hand" . Tee without cardioversion  03/22/2012   Procedure: TRANSESOPHAGEAL ECHOCARDIOGRAM (TEE);  Surgeon: Candee Furbish, MD;  Location: [facility] ENDOSCOPY;  Service: Cardiovascular;  Laterality: N/A; . Cardioversion  03/22/2012   Procedure: CARDIOVERSION;  Surgeon: Candee Furbish, MD;  Location: [place];  Service: Cardiovascular;  Laterality: N/A; . Cardioversion  04/19/2012   Procedure: CARDIOVERSION;  Surgeon: Sinclair Grooms, MD;  Location: [place];  Service: Cardiovascular;  Laterality: N/A; . Colonoscopy N/A 04/11/2013   Procedure: COLONOSCOPY;  Surgeon: Inda Castle, MD;  Location: [facility] ENDOSCOPY;  Service: Endoscopy;  Laterality: N/A; . Colonoscopy N/A 04/11/2013   Procedure: COLONOSCOPY;  Surgeon: Inda Castle, MD;  Location: [facility] ENDOSCOPY;  Service: Endoscopy;  Laterality: N/A; [name] with known sCHF and A.fib, presented 05/08 after out of hospital cardiac arrest. ROSC within 15-20 minutes after defib x 2. ETT 5/8-5/14. Now with acute encephalopathy with concern for anoxia per MD notes. Notable dysphagia s/p extubation.  Subjective: pleasant, mildly confused Assessment / Plan / Recommendation CHL IP CLINICAL IMPRESSIONS 02/09/2016 Therapy  Diagnosis Mild pharyngeal phase dysphagia Clinical Impression Pt presents with a mild pharyngeal dysphagia marked by trace, silent aspiration of thin liquids due to decreased laryngeal closure during the swallow.  There is adequate mastication; swift swallow timing; good pharyngeal peristalsis.  Decreased UES patency leads to slight backflow of materials from cervical esophagus into pyriforms - residue is limited and did not spill into airway.  For now, recommend initiating a dysphagia 3 diet with nectar-thick liquids; continue meds whole in puree.  SLP will follow for safety and diet advancement.  Impact on safety and function Mild aspiration risk   CHL IP TREATMENT RECOMMENDATION 02/09/2016 Treatment Recommendations Therapy as outlined in treatment plan below   Prognosis 02/09/2016 Prognosis for Safe Diet Advancement Good Barriers to Reach Goals -- Barriers/Prognosis Comment -- CHL IP DIET RECOMMENDATION 02/09/2016 SLP Diet Recommendations Dysphagia 3 (Mech soft) solids;Nectar thick liquid Liquid Administration via Cup Medication Administration Whole meds with puree Compensations Slow rate;Small sips/bites Postural Changes Seated upright at 90 degrees   CHL IP OTHER RECOMMENDATIONS 02/09/2016 Recommended Consults -- Oral Care Recommendations Oral care BID Other Recommendations Order thickener from pharmacy   CHL IP FOLLOW UP RECOMMENDATIONS 02/09/2016 Follow up Recommendations (No Data)   CHL IP FREQUENCY AND DURATION 02/09/2016 Speech Therapy Frequency (ACUTE ONLY) min 3x week Treatment Duration 2 weeks      CHL IP ORAL PHASE 02/09/2016 Oral Phase WFL Oral - Pudding Teaspoon -- Oral - Pudding Cup -- Oral - Honey Teaspoon -- Oral - Honey Cup -- Oral - Nectar Teaspoon -- Oral - Nectar Cup -- Oral - Nectar Straw -- Oral -  Thin Teaspoon -- Oral - Thin Cup -- Oral - Thin Straw -- Oral - Puree -- Oral - Mech Soft -- Oral - Regular -- Oral - Multi-Consistency -- Oral - Pill -- Oral Phase - Comment --  CHL IP PHARYNGEAL PHASE  02/09/2016 Pharyngeal Phase Impaired Pharyngeal- Pudding Teaspoon -- Pharyngeal -- Pharyngeal- Pudding Cup -- Pharyngeal -- Pharyngeal- Honey Teaspoon -- Pharyngeal -- Pharyngeal- Honey Cup -- Pharyngeal -- Pharyngeal- Nectar Teaspoon -- Pharyngeal -- Pharyngeal- Nectar Cup -- Pharyngeal -- Pharyngeal- Nectar Straw -- Pharyngeal -- Pharyngeal- Thin Teaspoon -- Pharyngeal -- Pharyngeal- Thin Cup Reduced airway/laryngeal closure;Trace aspiration;Penetration/Aspiration during swallow Pharyngeal Material enters airway, passes BELOW cords without attempt by patient to eject out (silent aspiration) Pharyngeal- Thin Straw -- Pharyngeal -- Pharyngeal- Puree -- Pharyngeal -- Pharyngeal- Mechanical Soft -- Pharyngeal -- Pharyngeal- Regular -- Pharyngeal -- Pharyngeal- Multi-consistency -- Pharyngeal -- Pharyngeal- Pill -- Pharyngeal -- Pharyngeal Comment no pharyngeal residue post-swallow  CHL IP CERVICAL ESOPHAGEAL PHASE 02/09/2016 Cervical Esophageal Phase Impaired Pudding Teaspoon -- Pudding Cup -- Honey Teaspoon -- Honey Cup -- Nectar Teaspoon -- Nectar Cup -- Nectar Straw -- Thin Teaspoon -- Thin Cup -- Thin Straw -- Puree -- Mechanical Soft -- Regular -- Multi-consistency -- Pill -- Cervical Esophageal Comment tight UES with minimal backflow noted into pyriforms No flowsheet data found. [name] 02/09/2016, 2:23 PM               Assessment/Plan: Diagnosis: Paraplegia of undetermined etiology, workup in progress 1. Does the need for close, 24 hr/day medical supervision in concert with the patient's rehab needs make it unreasonable for this patient to be served in a less intensive setting? Yes 2. Co-Morbidities requiring supervision/potential complications: Coronary artery disease, atrial fibrillation on anticoagulation, acute renal failure, mild anoxic encep 3. Due to bladder management, bowel management, safety, skin/wound care, disease management, medication administration, pain management and  patient education, does the patient require 24 hr/day rehab nursing? Yes 4. Does the patient require coordinated care of a physician, rehab nurse, PT (1-2 hrs/day, 5 days/week), OT (1-2 hrs/day, 5 days/week) and SLP (.5-1 hrs/day, 5 days/week) to address physical and functional deficits in the context of the above medical diagnosis(es)? Yes Addressing deficits in the following areas: balance, endurance, locomotion, strength, transferring, bowel/bladder control, bathing, dressing, feeding, toileting, cognition and psychosocial support 5. Can the patient actively participate in an intensive therapy program of at least 3 hrs of therapy per day at least 5 days per week? Yes 6. The potential for patient to make measurable gains while on inpatient rehab is good 7. Anticipated functional outcomes upon discharge from inpatient rehab are supervision  with PT, supervision with OT, modified independent with SLP. 8. Estimated rehab length of stay to reach the above functional goals is: 20-25d 9. Does the patient have adequate social supports and living environment to accommodate these discharge functional goals? Yes 10. Anticipated D/C setting: Home 11. Anticipated post D/C treatments: [place] therapy 12. Overall Rehab/Functional Prognosis: good  RECOMMENDATIONS: This patient's condition is appropriate for continued rehabilitative care in the following setting: CIR Patient has agreed to participate in recommended program. Yes Note that insurance prior authorization may be required for reimbursement for recommended care.  Comment: Needs to complete workup of paraplegia. Also will need to establish treatment prior to CIR    02/10/2016

## 2016-02-10 NOTE — Progress Notes (Signed)
MRI called for ffup-pt refused the procedure for now,not having any pain/numbness.

## 2016-02-10 NOTE — Progress Notes (Signed)
Nutrition Follow-up  DOCUMENTATION CODES:   Obesity unspecified  INTERVENTION:  Ensure Enlive BID. Each Supplement provides 350 kcals and 20 grams of protein.  NUTRITION DIAGNOSIS:   Inadequate oral intake related to dysphagia as evidenced by per patient/family report, meal completion < 25%.  Ongoing   GOAL:   Patient will meet greater than or equal to 90% of their needs  Unmet   MONITOR:   PO intake, Supplement acceptance, Labs, Weight trends, Diet advancement, Skin, I & O's  ASSESSMENT:   Pt with known sCHF and A.fib, presented 05/08 after out of hospital cardiac arrest. ROSC within 15-20 minutes after defib x 2. Hypothermia protocol. Cooling completed.  5/14 Extubated 5/17 Diet advanced to dyphagia 3 and nectar thick liquids.  Pt eating breakfast of scrambled eggs, grits, biscuit, and magic cup at time of visit. Pt denies N/V, abdominal pain, and swallowing difficulties with current diet.  Pt requests Ensure. Will order BID to supplement diet.   Labs reviewed; CBGs 97-167, BUN 98, creat 3.15, Ca 8.6, GFR 22, phos 5.5, mag 2.8.  Meds reviewed.  Diet Order:  DIET DYS 3 Room service appropriate?: Yes; Fluid consistency:: Nectar Thick  Skin:  Reviewed, no issues  Last BM:  5/16  Height:   Ht Readings from Last 1 Encounters:  02/01/16 5\' 9"  (1.753 m)    Weight:   Wt Readings from Last 1 Encounters:  02/07/16 237 lb 10.5 oz (107.8 kg)    Ideal Body Weight:  72.7 kg  BMI:  Body mass index is 35.08 kg/(m^2).  Estimated Nutritional Needs:   Kcal:  2000-2200  Protein:  110-130 grams  Fluid:  > 2 L/day  EDUCATION NEEDS:   No education needs identified at this time  Geoffery Lyons, [place] Dietetic Intern Pager [phone]

## 2016-02-10 NOTE — Progress Notes (Signed)
Speech Language Pathology Treatment: Dysphagia  Patient Details Name: Eric [name] MRN: [id number] DOB: 11/16/1950 Today's Date: 02/10/2016 Time: 1040-1050 SLP Time Calculation (min) (ACUTE ONLY): 10 min  Assessment / Plan / Recommendation Clinical Impression  Pt with much improved phonation today; voice is audible, improved cough.  Consuming breakfast with excellent toleration - no overt s/s of aspiration (however, pt silent aspirator per 5/17 MBS).  Limited appetite. Reviewed results and recs from [place].  Min cues/reminders overall; cognition is better.  Suspect will see concomitant improvement in swallow as voice becomes stronger due to improving adequacy of vocal fold adduction.  Recommend continuing dysphagia 3 diet with nectars for now.  SLP will continue to follow for safety/diet advancement.  Pt in agreement.    HPI [name] with known sCHF and A.fib, presented 05/08 after out of hospital cardiac arrest. ROSC within 15-20 minutes after defib x 2. ETT 5/8-5/14. Now with acute encephalopathy with concern for anoxia per MD notes. Notable dysphagia s/p extubation. MBS 5/17 with trace silent aspiration thin liquids; now with possible rhabdo.        SLP Plan  Continue with current plan of care     Recommendations  Diet recommendations: Dysphagia 3 (mechanical soft);Nectar-thick liquid Liquids provided via: Cup Medication Administration: Whole meds with puree Supervision: Patient able to self feed Compensations: Slow rate;Small sips/bites Postural Changes and/or Swallow Maneuvers: Seated upright 90 degrees             Plan: Continue with current plan of care     GO              Eric [name] L. Tivis Ringer, [place] CCC/SLP Pager [phone]   Juan Quam Eric [name] 02/10/2016, 11:41 AM

## 2016-02-10 NOTE — Progress Notes (Signed)
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   He was in his USOH until evening of 05/08 when he was with his wife at [place]. As they were leaving [employer], he began driving the car and while still in the parking lot, he began to breath very heavily before becoming unresponsive. His wife immediately got the car stopped, got him out of the car and began CPR while calling EMS. She feels that EMS arrived within 5 - 10 minutes and upon their arrival, he received 2 defibrillations via AED. ROSC was achieved within roughly 15 - 20 minutes.  Upon ED arrival, he remained unresponsive and was subsequently intubated. PCCM was called for admission and consideration of hypothermia protocol. Cardiology was also called in consultation.  Per his wife, he had not had any complaints recently and had behaved completely normal. 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   Assessment & Plan:   Principal Problem:   Cardiac arrest [facility]) Active Problems:   Chronic systolic heart failure (HCC)   Hyperlipidemia   Left bundle branch block   Obesity (BMI 30-39.9)   Atrial flutter ([place])   Acute respiratory failure ([place])   Encounter for central line placement   Arrhythmia   Altered mental status   HCAP (healthcare-associated pneumonia)   AKI (acute kidney injury) ([place])   Ventricular fibrillation ([place])   Aortic aneurysm without rupture (HCC)   Aortic insufficiency  V. fib Cardiac arrest  - CPR performed by wife, multiple shocks -Hypothermic protocol performed  Chronic systolic CHF  -(EF [id number] per echo from April 2017)  Aortic aneurysm -4.9 cm aortic aneurysm -- will need f/u CT of chest once extubated  and Cr improved   HTN,   Paroxysmal A.fib on Eliquis,  -Strict in and out -Daily weight Filed Weights   02/05/16 0200 02/06/16 0428 02/07/16 0411  Weight: 113 kg (249 lb 1.9 oz) 111.4 kg (245 lb 9.5 oz) 107.8 kg (237 lb 10.5 oz)   Acute encephalopathy  -Resolved  New-onset bilateral LE weakness and numbness -MRI L-spine and brain under anesthesia.   Acute on chronic renal failure (baselineCr~1.9)  Lab Results  Component Value Date   CREATININE 3.15* 02/10/2016   CREATININE 2.87* 02/09/2016   CREATININE 2.34* 02/08/2016   Hyperkalemia -resolved  Rhabdomyolysis -Atorvastatin stopped 5/17 -Patient's CK climbing and after 10 days cannot blame V. fib arrest. Not being diuresed. -Repeat CK q 6hr -Sodium Bicarb 75 ml/hr   Nutrition -Dysphagia 3 nectar thick  Aspiration Pneumonia positive CITROBACTER KOSERI -Completed 7 day course antibiotics  -Patient with continued leukocytosis, however afebrile. Monitor closely off antibiotics  Hypothyroidism. -Synthroid 75 g daily  -TSH pending    DVT prophylaxis: Subcutaneous heparin Code Status: Full Family Communication: None available Disposition Plan: Resolution rhabdomyolysis, workup for lower extremity weakness   Consultants:  Stef[facility] Gaul nephrology    Procedures/Significant Events:  05/08 - admitted after cardiac arrest. Started on TTM - 33 degrees C CXR 05/08 >>no acute process. CT head 05/08 >>no acute process Echo 5/9>>EF 30 to 35%, severe aortic regurgitation EEG 5/9>>no focal, hemispheric, or lateralizing features; demonstrated a burst/suppression pattern, which can be consistent with a sedated EEG 5/11>>mod diffuse slowing, No epileptiform activity was recorded. 5/10-pt  rewarmed per hypothermia protocol 5/14 >> Extubated Hip spine films 5/15 > no acute abnormality 5/16 TEE;LVEF= 30%- 35%. Diffuse hypokinesis. - Aortic valve: Moderate regurgitation.- Left atrium: Moderately dilated.    Cultures Blood 05/08 >>no growth 5/11>> Urine 05/08 >>no growth 5/10 Sputum 05/08 >>few GPC, few GNR>>> 5/12 sputum positive CITROBACTER KOSERI MRSA PCR 5/9>>Negative BCx2 5/12>>> 5/18 blood pending 5/18 urine pending  Antimicrobials: Zosyn 5/11>> 5/16 Vancomycin 5/11>> 5/14 Ceftriaxone 5/16 >> 5/18   Devices    LINES / TUBES:      Continuous Infusions: . sodium chloride 10 mL/hr at 02/08/16 1541  . sodium chloride 75 mL/hr at 02/10/16 0726  . heparin 800 Units/hr (02/09/16 0734)     Subjective: 5/18 A/O 4, NAD.  Objective: Filed Vitals:   02/10/16 0400 02/10/16 0500 02/10/16 0600 02/10/16 0700  BP: 147/76  125/78   Pulse: 96 95 95 94  Temp: 98.7 F (37.1 C)     TempSrc: Oral     Resp: 13 20 23 12   Height:      Weight:      SpO2: 96% 94% 97% 97%    Intake/Output Summary (Last 24 hours) at 02/10/16 0815 Last data filed at 02/10/16 0700  Gross per 24 hour  Intake   1509 ml  Output    845 ml  Net    664 ml   Filed Weights   02/05/16 0200 02/06/16 0428 02/07/16 0411  Weight: 113 kg (249 lb 1.9 oz) 111.4 kg (245 lb 9.5 oz) 107.8 kg (237 lb 10.5 oz)    Examination:  General: A/O 4, NAD, No acute respiratory distress Eyes: negative scleral hemorrhage, negative anisocoria, negative icterus ENT: Negative Runny nose, negative gingival bleeding, Neck:  Negative scars, masses, torticollis, lymphadenopathy, JVD Lungs: Clear to auscultation bilaterally without wheezes or crackles Cardiovascular: Regular rate and rhythm without murmur gallop or rub normal S1 and S2 Abdomen: negative abdominal pain, nondistended, positive soft, bowel sounds, no rebound, no ascites, no appreciable mass Extremities: No significant cyanosis, clubbing, or edema bilateral lower extremities Skin: Negative rashes, lesions, ulcers Psychiatric:  Negative depression, negative anxiety, negative fatigue, negative mania Musculoskeletal; pain to palpation midline L2-S1, pain palpation  left lumbosacral area (facet pain?).  Central nervous system:  Cranial nerves II through XII intact, tongue/uvula midline, upper extremities muscle strength 5/5, Bilat lower extremity strength 3/5, sensation intact throughout, quick finger touch bilateral within normal limits,  negative dysarthria, negative expressive aphasia, negative receptive aphasia.  .     Data Reviewed: Care during the described time interval was provided by me .  I have reviewed this patient's available data, including medical history, events of note, physical examination, and all test results as part of my evaluation. I have personally reviewed and interpreted all radiology studies.  CBC:  Recent Labs Lab 02/06/16 0509 02/07/16 0415 02/08/16 0418 02/09/16 0723 02/10/16 0250  WBC 9.0 11.1* 13.4* 16.3* 17.8*  HGB 12.4* 13.8 11.5* 10.7* 8.8*  HCT 36.8* 41.0 34.6* 32.9* 27.2*  MCV 94.1 94.3 95.3 95.4 94.4  PLT 215 237 237 271 [id number]   Basic Metabolic Panel:  Recent Labs Lab 02/05/16 1522 02/06/16 0509 02/07/16 0415 02/08/16 0418 02/09/16 0723 02/10/16 0250  NA 143 142 142 144 146* 144  K 4.9 4.3 4.1 3.9 4.4 4.2  CL 112* 110 107 107 108 109  CO2 20* 22 24 25 22  21*  GLUCOSE 185* 150* 148* 147* 138* 121*  BUN 52* 51* 50* 58* 81* 98*  CREATININE 2.31* 2.06* 2.24*  2.34* 2.87* 3.15*  CALCIUM 9.5 9.1 9.1 9.2 9.1 8.6*  MG 2.4  --   --   --  2.9* 2.8*  PHOS 3.2  --   --   --  4.9* 5.5*   GFR: Estimated Creatinine Clearance: 28.7 mL/min (by C-G formula based on Cr of 3.15). Liver Function Tests: No results for input(s): AST, ALT, ALKPHOS, BILITOT, PROT, ALBUMIN in the last 168 hours. No results for input(s): LIPASE, AMYLASE in the last 168 hours. No results for input(s): AMMONIA in the last 168 hours. Coagulation Profile: No results for input(s): INR, PROTIME in the last 168 hours. Cardiac Enzymes:  Recent Labs Lab 02/05/16 1522 02/09/16 1122  CKTOTAL  --  3457*  TROPONINI 0.08*  --    BNP (last  3 results) No results for input(s): PROBNP in the last 8760 hours. HbA1C: No results for input(s): HGBA1C in the last 72 hours. CBG:  Recent Labs Lab 02/09/16 1147 02/09/16 1645 02/09/16 2001 02/10/16 0038 02/10/16 0403  GLUCAP 167* 156* 97 134*  132* 113*   Lipid Profile: No results for input(s): CHOL, HDL, LDLCALC, TRIG, CHOLHDL, LDLDIRECT in the last 72 hours. Thyroid Function Tests: No results for input(s): TSH, T4TOTAL, FREET4, T3FREE, THYROIDAB in the last 72 hours. Anemia Panel: No results for input(s): VITAMINB12, FOLATE, FERRITIN, TIBC, IRON, RETICCTPCT in the last 72 hours. Urine analysis:    Component Value Date/Time   COLORURINE YELLOW 01/31/2016 2236   APPEARANCEUR TURBID* 01/31/2016 2236   LABSPEC 1.018 01/31/2016 2236   PHURINE 6.0 01/31/2016 2236   GLUCOSEU 100* 01/31/2016 2236   HGBUR MODERATE* 01/31/2016 2236   BILIRUBINUR NEGATIVE 01/31/2016 2236   KETONESUR NEGATIVE 01/31/2016 2236   PROTEINUR >300* 01/31/2016 2236   NITRITE NEGATIVE 01/31/2016 2236   LEUKOCYTESUR NEGATIVE 01/31/2016 2236   Sepsis Labs: @LABRCNTIP (procalcitonin:4,lacticidven:4)  ) Recent Results (from the past 240 hour(s))  Urine culture     Status: None   Collection Time: 01/31/16 10:36 PM  Result Value Ref Range Status   Specimen Description URINE, CATHETERIZED  Final   Special Requests NONE  Final   Culture NO GROWTH  Final   Report Status 02/02/2016 FINAL  Final  MRSA PCR Screening     Status: None   Collection Time: 02/01/16 12:51 AM  Result Value Ref Range Status   MRSA by PCR NEGATIVE NEGATIVE Final    Comment:        The GeneXpert MRSA Assay (FDA approved for NASAL specimens only), is one component of a comprehensive MRSA colonization surveillance program. It is not intended to diagnose MRSA infection nor to guide or monitor treatment for MRSA infections.   Culture, blood (Routine X 2) w Reflex to ID Panel     Status: None   Collection Time: 02/01/16  1:14  AM  Result Value Ref Range Status   Specimen Description BLOOD RIGHT HAND  Final   Special Requests IN PEDIATRIC BOTTLE 2CC  Final   Culture NO GROWTH 5 DAYS  Final   Report Status 02/06/2016 FINAL  Final  Culture, blood (Routine X 2) w Reflex to ID Panel     Status: None   Collection Time: 02/01/16  1:14 AM  Result Value Ref Range Status   Specimen Description BLOOD RIGHT HAND  Final   Special Requests IN PEDIATRIC BOTTLE 1CC  Final   Culture NO GROWTH 5 DAYS  Final   Report Status 02/06/2016 FINAL  Final  Culture, Urine     Status: None  Collection Time: 02/04/16 12:11 PM  Result Value Ref Range Status   Specimen Description URINE, CATHETERIZED  Final   Special Requests Normal  Final   Culture NO GROWTH  Final   Report Status 02/05/2016 FINAL  Final  Culture, respiratory (NON-Expectorated)     Status: None   Collection Time: 02/04/16 12:16 PM  Result Value Ref Range Status   Specimen Description TRACHEAL ASPIRATE  Final   Special Requests Normal  Final   Gram Stain   Final    ABUNDANT WBC PRESENT,BOTH PMN AND MONONUCLEAR FEW SQUAMOUS EPITHELIAL CELLS PRESENT FEW GRAM POSITIVE COCCI IN PAIRS IN CLUSTERS FEW GRAM NEGATIVE RODS Performed at [facility]    Culture   Final    MODERATE CITROBACTER KOSERI Performed at [facility]    Report Status 02/07/2016 FINAL  Final   Organism ID, Bacteria CITROBACTER KOSERI  Final      Susceptibility   Citrobacter koseri - MIC*    CEFEPIME <=1 SENSITIVE Sensitive     CEFTAZIDIME <=1 SENSITIVE Sensitive     CEFTRIAXONE <=1 SENSITIVE Sensitive     CIPROFLOXACIN <=0.25 SENSITIVE Sensitive     GENTAMICIN <=1 SENSITIVE Sensitive     IMIPENEM <=0.25 SENSITIVE Sensitive     PIP/TAZO <=4 SENSITIVE Sensitive     TOBRAMYCIN <=1 SENSITIVE Sensitive     TRIMETH/SULFA Value in next row Sensitive      <=20 SENSITIVE(NOTE)    * MODERATE CITROBACTER KOSERI  Culture, blood (routine x 2)     Status: None   Collection Time:  02/04/16  1:50 PM  Result Value Ref Range Status   Specimen Description BLOOD LEFT HAND  Final   Special Requests IN PEDIATRIC BOTTLE 2CC  Final   Culture NO GROWTH 5 DAYS  Final   Report Status 02/09/2016 FINAL  Final  Culture, blood (routine x 2)     Status: None   Collection Time: 02/04/16  2:08 PM  Result Value Ref Range Status   Specimen Description BLOOD RIGHT HAND  Final   Special Requests IN PEDIATRIC BOTTLE .5CC  Final   Culture NO GROWTH 5 DAYS  Final   Report Status 02/09/2016 FINAL  Final         Radiology Studies: US Renal  02/09/2016  CLINICAL DATA:  Acute kidney injury. History of hypertension and chronic kidney disease. EXAM: RENAL / URINARY TRACT ULTRASOUND COMPLETE COMPARISON:  None. FINDINGS: Right Kidney: Length: 11.4 cm. Diffusely increased parenchymal echogenicity. No mass or hydronephrosis visualized. Left Kidney: Length: 12.9 cm. Diffusely increased parenchymal echogenicity. No mass or hydronephrosis visualized. Bladder: Appears normal for degree of bladder distention. IMPRESSION: Normal size kidneys with diffusely increased parenchymal echogenicity, consistent with medical renal disease. No evidence of hydronephrosis. Electronically Signed   By: Earle Gell M.D.   On: 02/09/2016 13:52   Dg Swallowing Func-speech Pathology  02/09/2016  Objective Swallowing Evaluation: Type of Study: MBS-Modified Barium Swallow Study Patient Details Name: Eric [name] MRN: [id number] Date of Birth: [birth year] Today's Date: 02/09/2016 Time: SLP Start Time (ACUTE ONLY): 1340-SLP Stop Time (ACUTE ONLY): 1404 SLP Time Calculation (min) (ACUTE ONLY): 24 min Past Medical History: Past Medical History Diagnosis Date . Claustrophobia  . Heart murmur  . Hypertension  . Varicose vein of leg    right . Dysrhythmia    "palpitations" . Migraine 02/13/12   "opthalmic" . Chronic lower back pain  . Exertional dyspnea 01/2012 . CHF (congestive heart failure) ([place])  . Hypothyroidism  . Chronic kidney  disease    kidney fx studies increased  Past Surgical History: Past Surgical History Procedure Laterality Date . Skin melanocytoma excision  2012   "above left clavicle" . Finger surgery  2012   "4th digit right hand; thumb on left hand" . Tee without cardioversion  03/22/2012   Procedure: TRANSESOPHAGEAL ECHOCARDIOGRAM (TEE);  Surgeon: Candee Furbish, MD;  [facility] ENDOSCOPY;  Service: Cardiovascular;  Laterality: N/A; . Cardioversion  03/22/2012   Procedure: CARDIOVERSION;  Surgeon: Candee Furbish, MD;  Location: [place];  Service: Cardiovascular;  Laterality: N/A; . Cardioversion  04/19/2012   Procedure: CARDIOVERSION;  Surgeon: Sinclair Grooms, MD;  Location: [facility];  Service: Cardiovascular;  Laterality: N/A; . Colonoscopy N/A 04/11/2013   Procedure: COLONOSCOPY;  Surgeon: Inda Castle, MD;  Location: [facility] ENDOSCOPY;  Service: Endoscopy;  Laterality: N/A; . Colonoscopy N/A 04/11/2013   Procedure: COLONOSCOPY;  Surgeon: Inda Castle, MD;  Location: [facility] ENDOSCOPY;  Service: Endoscopy;  Laterality: N/A; [name] with known sCHF and A.fib, presented 05/08 after out of hospital cardiac arrest. ROSC within 15-20 minutes after defib x 2. ETT 5/8-5/14. Now with acute encephalopathy with concern for anoxia per MD notes. Notable dysphagia s/p extubation.  Subjective: pleasant, mildly confused Assessment / Plan / Recommendation CHL IP CLINICAL IMPRESSIONS 02/09/2016 Therapy Diagnosis Mild pharyngeal phase dysphagia Clinical Impression Pt presents with a mild pharyngeal dysphagia marked by trace, silent aspiration of thin liquids due to decreased laryngeal closure during the swallow.  There is adequate mastication; swift swallow timing; good pharyngeal peristalsis.  Decreased UES patency leads to slight backflow of materials from cervical esophagus into pyriforms - residue is limited and did not spill into airway.  For now, recommend initiating a dysphagia 3 diet with nectar-thick liquids; continue meds whole in  puree.  SLP will follow for safety and diet advancement.  Impact on safety and function Mild aspiration risk   CHL IP TREATMENT RECOMMENDATION 02/09/2016 Treatment Recommendations Therapy as outlined in treatment plan below   Prognosis 02/09/2016 Prognosis for Safe Diet Advancement Good Barriers to Reach Goals -- Barriers/Prognosis Comment -- CHL IP DIET RECOMMENDATION 02/09/2016 SLP Diet Recommendations Dysphagia 3 (Mech soft) solids;Nectar thick liquid Liquid Administration via Cup Medication Administration Whole meds with puree Compensations Slow rate;Small sips/bites Postural Changes Seated upright at 90 degrees   CHL IP OTHER RECOMMENDATIONS 02/09/2016 Recommended Consults -- Oral Care Recommendations Oral care BID Other Recommendations Order thickener from pharmacy   CHL IP FOLLOW UP RECOMMENDATIONS 02/09/2016 Follow up Recommendations (No Data)   CHL IP FREQUENCY AND DURATION 02/09/2016 Speech Therapy Frequency (ACUTE ONLY) min 3x week Treatment Duration 2 weeks      CHL IP ORAL PHASE 02/09/2016 Oral Phase WFL Oral - Pudding Teaspoon -- Oral - Pudding Cup -- Oral - Honey Teaspoon -- Oral - Honey Cup -- Oral - Nectar Teaspoon -- Oral - Nectar Cup -- Oral - Nectar Straw -- Oral - Thin Teaspoon -- Oral - Thin Cup -- Oral - Thin Straw -- Oral - Puree -- Oral - Mech Soft -- Oral - Regular -- Oral - Multi-Consistency -- Oral - Pill -- Oral Phase - Comment --  CHL IP PHARYNGEAL PHASE 02/09/2016 Pharyngeal Phase Impaired Pharyngeal- Pudding Teaspoon -- Pharyngeal -- Pharyngeal- Pudding Cup -- Pharyngeal -- Pharyngeal- Honey Teaspoon -- Pharyngeal -- Pharyngeal- Honey Cup -- Pharyngeal -- Pharyngeal- Nectar Teaspoon -- Pharyngeal -- Pharyngeal- Nectar Cup -- Pharyngeal -- Pharyngeal- Nectar Straw -- Pharyngeal -- Pharyngeal- Thin Teaspoon -- Pharyngeal -- Pharyngeal- Thin  Cup Reduced airway/laryngeal closure;Trace aspiration;Penetration/Aspiration during swallow Pharyngeal Material enters airway, passes BELOW cords without  attempt by patient to eject out (silent aspiration) Pharyngeal- Thin Straw -- Pharyngeal -- Pharyngeal- Puree -- Pharyngeal -- Pharyngeal- Mechanical Soft -- Pharyngeal -- Pharyngeal- Regular -- Pharyngeal -- Pharyngeal- Multi-consistency -- Pharyngeal -- Pharyngeal- Pill -- Pharyngeal -- Pharyngeal Comment no pharyngeal residue post-swallow  CHL IP CERVICAL ESOPHAGEAL PHASE 02/09/2016 Cervical Esophageal Phase Impaired Pudding Teaspoon -- Pudding Cup -- Honey Teaspoon -- Honey Cup -- Nectar Teaspoon -- Nectar Cup -- Nectar Straw -- Thin Teaspoon -- Thin Cup -- Thin Straw -- Puree -- Mechanical Soft -- Regular -- Multi-consistency -- Pill -- Cervical Esophageal Comment tight UES with minimal backflow noted into pyriforms No flowsheet data found. [name] 02/09/2016, 2:23 PM                   Scheduled Meds: . amiodarone  400 mg Oral BID  . antiseptic oral rinse  7 mL Mouth Rinse BID  . atorvastatin  20 mg Oral q1800  . carvedilol  37.5 mg Oral BID WC  . cefTRIAXone (ROCEPHIN)  IV  1 g Intravenous Q24H  . docusate sodium  100 mg Oral BID  . insulin aspart  2-6 Units Subcutaneous Q4H  . levothyroxine  75 mcg Oral QAC breakfast  . polyethylene glycol  17 g Oral Daily   Continuous Infusions: . sodium chloride 10 mL/hr at 02/08/16 1541  . sodium chloride 75 mL/hr at 02/10/16 0726  . heparin 800 Units/hr (02/09/16 0734)     LOS: 9 days    Time spent: 40 minutes    WOODS, Geraldo Docker, MD [facility] Pager [phone]   If 7PM-7AM, please contact night-coverage [email or web address] Password [email or web address] 02/10/2016, 8:15 AM

## 2016-02-10 NOTE — Progress Notes (Signed)
Patient Name: Eric [name] Date of Encounter: 02/10/2016  Principal Problem:   Cardiac arrest [facility]) Active Problems:   Chronic systolic heart failure ([place])   Hyperlipidemia   Left bundle branch block   Obesity (BMI 30-39.9)   Atrial flutter ([place])   Acute respiratory failure ([place])   Encounter for central line placement   Arrhythmia   Altered mental status   HCAP (healthcare-associated pneumonia)   AKI (acute kidney injury) ([place])   Ventricular fibrillation ([place])   Aortic aneurysm without rupture ([place])   Aortic insufficiency   Length of Stay: 9  SUBJECTIVE Just finished bathing w/ RN, reports being fatigued.   Tele: episode of vtach   CURRENT MEDS . amiodarone  400 mg Oral BID  . antiseptic oral rinse  7 mL Mouth Rinse BID  . atorvastatin  20 mg Oral q1800  . carvedilol  37.5 mg Oral BID WC  . cefTRIAXone (ROCEPHIN)  IV  1 g Intravenous Q24H  . docusate sodium  100 mg Oral BID  . insulin aspart  2-6 Units Subcutaneous Q4H  . levothyroxine  75 mcg Oral QAC breakfast  . polyethylene glycol  17 g Oral Daily    OBJECTIVE   Intake/Output Summary (Last 24 hours) at 02/10/16 0800 Last data filed at 02/10/16 0700  Gross per 24 hour  Intake   1509 ml  Output    845 ml  Net    664 ml   Filed Weights   02/05/16 0200 02/06/16 0428 02/07/16 0411  Weight: 249 lb 1.9 oz (113 kg) 245 lb 9.5 oz (111.4 kg) 237 lb 10.5 oz (107.8 kg)    PHYSICAL EXAM Filed Vitals:   02/10/16 0400 02/10/16 0500 02/10/16 0600 02/10/16 0700  BP: 147/76  125/78   Pulse: 96 95 95 94  Temp: 98.7 F (37.1 C)     TempSrc: Oral     Resp: 13 20 23 12   Height:      Weight:      SpO2: 96% 94% 97% 97%   General: Alert, oriented x3, no distress, whispering, NAD Head: EOMI, moist mucous membranes Pulm: clear to auscultation, no crackles or rhonchi Cardiovascular: irregular rhythm, tachycardic, no murmurs Abdomen: no tenderness or distention, no masses by palpation, no abnormal pulsatility or  arterial bruits, normal bowel sounds, no hepatosplenomegaly Extremities: no clubbing, cyanosis or edema; Neurological: CN 2-12 grossly intact Skin: warm and dry  LABS  CBC  Recent Labs  02/09/16 0723 02/10/16 0250  WBC 16.3* 17.8*  HGB 10.7* 8.8*  HCT 32.9* 27.2*  MCV 95.4 94.4  PLT 271 99991111   Basic Metabolic Panel  Recent Labs  02/09/16 0723 02/10/16 0250  NA 146* 144  K 4.4 4.2  CL 108 109  CO2 22 21*  GLUCOSE 138* 121*  BUN 81* 98*  CREATININE 2.87* 3.15*  CALCIUM 9.1 8.6*  MG 2.9* 2.8*  PHOS 4.9* 5.5*   Liver Function Tests No results for input(s): AST, ALT, ALKPHOS, BILITOT, PROT, ALBUMIN in the last 72 hours. No results for input(s): LIPASE, AMYLASE in the last 72 hours. Cardiac Enzymes  Recent Labs  02/09/16 [place]*    Radiology Studies Imaging results have been reviewed    ASSESSMENT AND PLAN  1. S/P cardiac arrest, presumed VT/VF VF on AED interrogation. Plan for CRT-D on this admission, secondary prevention. Normal electrolytes on admission. Moderate to severely depressed LVEF despite chronic comprehensive medical treatment. - likely will have to delay cardiac cath as renal function has  not improved.  2. Chronic systolic heart failure  Was on full dose carvedilol, Bidil and spironolactone prior to these events. AI was mild on TEE 2013, when EF was 25-30%, so it is unlikely the reason for his cardiomyopathy. HTN, idiopathic dilated CMP are possible etiologies, but have to exclude CAD.  - TEE 5/17 reveals EF of 99991111, grade 2 diastolic dysfunction  3. Aortic valve regurgitation,  Mild aortic regurgitation by previous echo, severe on most recent study (was it underestimated? Did it acutely worsen?). 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 TEE yesterday revealed severe aortic regurgitation with eccentric jet.   4. Atrial fibrillation, persistent Currently in atrial fibrillation with borderline controlled  rate. On home coreg 25mg  BID. On IV heparin, home DOAC has been stopped. Previous consideration was given to ablation for flutter, but not felt to be helpful. Try to keep on IV heparin until after cath and ICD implantation. Started on amiodarone po 400mg  qd.   5. Hypertension, accelerated Better after increased carvedilol.  6. Aortic root enlargement - aortic root measures 32mm, ascending aorta 70mm Will need CT Angio when renal function is clearly stabilized.  7. 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 Creatinine up 3.15 from 2.87 yesterday, he is +241ml since admission.  - nephrology following, renal u/s wnl  8. Left bundle branch block typical LBBB with QRS duration 131ms predicts excellent response to CRT, especially if he has nonischemic CMP.  9. Hyperlipidemia with good recent LDL, goal less than 70, consider increasing to lipitor 40  10. Possible aspiration pneumonia-moderate citrobacter koseri from tracheal aspirate culture sensitive to  on rocephin   Julious Oka, MD 02/10/2016 8:00 AM  I have seen and examined the patient along with Julious Oka, MD.  I have reviewed the chart, notes and new data.  I agree with her note.  Key new complaints: continues to have back pain and leg weakness; no dyspnea, no angina, no melena. He does not think he can tolerate an MRI.Claustrophobic, does not think he can tolerate MRI. Even with open MRI in the past, he required sedation. Key examination changes: clear lungs, paradoxically split S2, no clear S3, no edema. JVP hard to see. Afebrile. Key new findings / data: creatinine continues to worsen, Hgb dropping, WBC creeping up. CK is elevated  PLAN: Repeat cultures. Try CT lumbar spine without contrast (should also help evaluate for retroperitoneal hematoma as cause of drop in Hgb, while on IV  heparin). Cath canceled for today. Stop statin until cause/course of CK increase is clarified. Hard to believe current elevation in CK is related to original insult 10 days ago. Low dose Bidil for LV dysfunction and AI. Avoid hypotension.   Sanda Klein, MD, [place] [phone] 02/10/2016, 9:00 AM

## 2016-02-10 NOTE — Progress Notes (Signed)
Admit: 01/31/2016 LOS: 9  38M with out of hospital cardiac arrest 5/8 with AoCKD4.  Subjective:  Creatinine worsened Renal US negative for acute issue CK elevated yesterday, further up today Adequate UOP    05/17 0701 - 05/18 0700 In: [id number] [P.O.:440; I.V.:1027; IV Piggyback:50] Out: 845 [Urine:845]  Filed Weights   02/05/16 0200 02/06/16 0428 02/07/16 0411  Weight: 113 kg (249 lb 1.9 oz) 111.4 kg (245 lb 9.5 oz) 107.8 kg (237 lb 10.5 oz)    Scheduled Meds: . amiodarone  400 mg Oral BID  . carvedilol  37.5 mg Oral BID WC  . cefTRIAXone (ROCEPHIN)  IV  1 g Intravenous Q24H  . docusate sodium  100 mg Oral BID  . insulin aspart  2-6 Units Subcutaneous Q4H  . isosorbide-hydrALAZINE  0.5 tablet Oral TID  . levothyroxine  75 mcg Oral QAC breakfast  . polyethylene glycol  17 g Oral Daily   Continuous Infusions: . sodium chloride 10 mL/hr at 02/08/16 1541  . sodium chloride 75 mL/hr at 02/10/16 0726  . heparin 800 Units/hr (02/10/16 0800)   PRN Meds:.acetaminophen, bisacodyl, bisacodyl, hydrALAZINE, labetalol, metoprolol, morphine injection, oxyCODONE-acetaminophen  Current Labs: reviewed    Physical Exam:  Blood pressure 137/76, pulse 97, temperature 98.2 F (36.8 C), temperature source Axillary, resp. rate 17, height 5\' 9"  (1.753 m), weight 107.8 kg (237 lb 10.5 oz), SpO2 100 %. GEN: NAD ENT: NCAT EYES: EOMI CV: IRIR PULM: diminished in bases ABD: s/nt/nd SKIN: No rashes/lesions EXT:No LEE Nonfocal. AAOx3, CN2-12 intact  RENAL STUDIES 1. 5/17 Renal US: no structural or obstructive issues 2. 5/17 CK elevated 3457 3. 5/18 CK 5788; AST elevated as well  A 1. AoCKD4 1. Looks like related to mild rhabdo 2. Renal US w/o obstruction 3. Give NaHCO3 gtt (or acetate) low dose for alkalinization of urine 4. Check UA for Hb and pH 5. Sees Powell at [employer] 6. Daily weights, Daily Renal Panel, Strict I/Os, Avoid nephrotoxins (NSAIDs, judicious IV Contrast)   2. Rhabdomyolysis 1. Statin stopped 5/17 2. Cardiac Arrest 5/8: needs LHC and ICD 3. HTN: BP stable on carvediolol 4. Leg pain -- ? Statin related rhabdo 5. Aortic Insufficiency 6. AFib on heparin gtt 7. Systolic CHF 8. HCAP s/p ABX   Pearson Grippe MD 02/10/2016, 10:12 AM   Recent Labs Lab 02/05/16 1522  02/08/16 0418 02/09/16 0723 02/10/16 0250  NA 143  < > 144 146* 144  K 4.9  < > 3.9 4.4 4.2  CL 112*  < > 107 108 109  CO2 20*  < > 25 22 21*  GLUCOSE 185*  < > 147* 138* 121*  BUN 52*  < > 58* 81* 98*  CREATININE 2.31*  < > 2.34* 2.87* 3.15*  CALCIUM 9.5  < > 9.2 9.1 8.6*  PHOS 3.2  --   --  4.9* 5.5*  < > = values in this interval not displayed.  Recent Labs Lab 02/08/16 0418 02/09/16 0723 02/10/16 0250  WBC 13.4* 16.3* 17.8*  HGB 11.5* 10.7* 8.8*  HCT 34.6* 32.9* 27.2*  MCV 95.3 95.4 94.4  PLT 237 271 257

## 2016-02-10 NOTE — Progress Notes (Signed)
RN NOTIFIED THAT MRI WILL BE DONE ON 5/19 @ 9am AND THAT PT NEEDS TO BE NPO AT MIDNIGHT, NOTHING TO EAT OR DRINK, AND NO ICE CHIPS UNTIL AFTER MRI

## 2016-02-11 ENCOUNTER — Inpatient Hospital Stay (HOSPITAL_COMMUNITY): Payer: BC Managed Care – PPO

## 2016-02-11 ENCOUNTER — Inpatient Hospital Stay (HOSPITAL_COMMUNITY): Payer: BC Managed Care – PPO | Admitting: Certified Registered Nurse Anesthetist

## 2016-02-11 ENCOUNTER — Encounter (HOSPITAL_COMMUNITY)
Admission: EM | Disposition: A | Discharge status: Rehabilitation Facility | Payer: Self-pay | Source: Home / Self Care | Attending: Emergency Medicine

## 2016-02-11 ENCOUNTER — Encounter (HOSPITAL_COMMUNITY): Payer: Self-pay | Admitting: Certified Registered Nurse Anesthetist

## 2016-02-11 DIAGNOSIS — M545 Low back pain, unspecified: Secondary | ICD-10-CM | POA: Diagnosis present

## 2016-02-11 DIAGNOSIS — R400 Somnolence: Secondary | ICD-10-CM

## 2016-02-11 DIAGNOSIS — J9601 Acute respiratory failure with hypoxia: Secondary | ICD-10-CM | POA: Diagnosis not present

## 2016-02-11 DIAGNOSIS — I469 Cardiac arrest, cause unspecified: Secondary | ICD-10-CM | POA: Diagnosis not present

## 2016-02-11 DIAGNOSIS — J690 Pneumonitis due to inhalation of food and vomit: Secondary | ICD-10-CM

## 2016-02-11 DIAGNOSIS — N179 Acute kidney failure, unspecified: Secondary | ICD-10-CM | POA: Diagnosis not present

## 2016-02-11 DIAGNOSIS — I480 Paroxysmal atrial fibrillation: Secondary | ICD-10-CM

## 2016-02-11 DIAGNOSIS — M6282 Rhabdomyolysis: Secondary | ICD-10-CM | POA: Diagnosis present

## 2016-02-11 DIAGNOSIS — J9600 Acute respiratory failure, unspecified whether with hypoxia or hypercapnia: Secondary | ICD-10-CM | POA: Diagnosis not present

## 2016-02-11 DIAGNOSIS — R58 Hemorrhage, not elsewhere classified: Secondary | ICD-10-CM | POA: Diagnosis present

## 2016-02-11 DIAGNOSIS — I4901 Ventricular fibrillation: Secondary | ICD-10-CM | POA: Diagnosis not present

## 2016-02-11 HISTORY — PX: RADIOLOGY WITH ANESTHESIA: SHX6223

## 2016-02-11 LAB — GLUCOSE, CAPILLARY
GLUCOSE-CAPILLARY: 203 mg/dL — AB (ref 65–99)
Glucose-Capillary: 169 mg/dL — ABNORMAL HIGH (ref 65–99)
Glucose-Capillary: 181 mg/dL — ABNORMAL HIGH (ref 65–99)
Glucose-Capillary: 179 mg/dL — ABNORMAL HIGH (ref 65–99)

## 2016-02-11 LAB — CBC
HEMATOCRIT: 21 % — AB (ref 39.0–52.0)
Hemoglobin: 6.7 g/dL — CL (ref 13.0–17.0)
MCH: 30.5 pg (ref 26.0–34.0)
MCHC: 31.9 g/dL (ref 30.0–36.0)
MCV: 95.5 fL (ref 78.0–100.0)
Platelets: 301 10*3/uL (ref 150–400)
RBC: 2.2 MIL/uL — ABNORMAL LOW (ref 4.22–5.81)
RDW: 14.4 % (ref 11.5–15.5)
WBC: 24.2 10*3/uL — ABNORMAL HIGH (ref 4.0–10.5)

## 2016-02-11 LAB — CBC WITH DIFFERENTIAL/PLATELET
BASOS ABS: 0 10*3/uL (ref 0.0–0.1)
Basophils Absolute: 0 10*3/uL (ref 0.0–0.1)
Basophils Relative: 0 %
Basophils Relative: 0 %
EOS ABS: 0 10*3/uL (ref 0.0–0.7)
EOS PCT: 0 %
Eosinophils Absolute: 0 10*3/uL (ref 0.0–0.7)
Eosinophils Relative: 0 %
HCT: 21.9 % — ABNORMAL LOW (ref 39.0–52.0)
HCT: 27.8 % — ABNORMAL LOW (ref 39.0–52.0)
HEMOGLOBIN: 9.1 g/dL — AB (ref 13.0–17.0)
Hemoglobin: 7.1 g/dL — ABNORMAL LOW (ref 13.0–17.0)
LYMPHS PCT: 10 %
Lymphocytes Relative: 9 %
Lymphs Abs: 2.7 10*3/uL (ref 0.7–4.0)
Lymphs Abs: 2.2 10*3/uL (ref 0.7–4.0)
MCH: 30.9 pg (ref 26.0–34.0)
MCH: 29.6 pg (ref 26.0–34.0)
MCHC: 32.4 g/dL (ref 30.0–36.0)
MCHC: 32.7 g/dL (ref 30.0–36.0)
MCV: 95.2 fL (ref 78.0–100.0)
MCV: 90.6 fL (ref 78.0–100.0)
MONOS PCT: 10 %
Monocytes Absolute: 3.2 10*3/uL — ABNORMAL HIGH (ref 0.1–1.0)
Monocytes Absolute: 2.4 10*3/uL — ABNORMAL HIGH (ref 0.1–1.0)
Monocytes Relative: 12 %
NEUTROS PCT: 78 %
NEUTROS PCT: 81 %
Neutro Abs: 20.7 10*3/uL — ABNORMAL HIGH (ref 1.7–7.7)
Neutro Abs: 19.7 10*3/uL — ABNORMAL HIGH (ref 1.7–7.7)
PLATELETS: 279 10*3/uL (ref 150–400)
Platelets: 294 10*3/uL (ref 150–400)
RBC: 2.3 MIL/uL — ABNORMAL LOW (ref 4.22–5.81)
RBC: 3.07 MIL/uL — AB (ref 4.22–5.81)
RDW: 14.2 % (ref 11.5–15.5)
RDW: 16.1 % — ABNORMAL HIGH (ref 11.5–15.5)
WBC: 26.6 10*3/uL — ABNORMAL HIGH (ref 4.0–10.5)
WBC: 24.3 10*3/uL — AB (ref 4.0–10.5)

## 2016-02-11 LAB — PROTIME-INR
INR: 1.42 (ref 0.00–1.49)
PROTHROMBIN TIME: 17.5 s — AB (ref 11.6–15.2)

## 2016-02-11 LAB — HEPARIN LEVEL (UNFRACTIONATED)
HEPARIN UNFRACTIONATED: 0.28 [IU]/mL — AB (ref 0.30–0.70)
HEPARIN UNFRACTIONATED: 0.19 [IU]/mL — AB (ref 0.30–0.70)

## 2016-02-11 LAB — BASIC METABOLIC PANEL
ANION GAP: 12 (ref 5–15)
BUN: 116 mg/dL — ABNORMAL HIGH (ref 6–20)
CALCIUM: 8.3 mg/dL — AB (ref 8.9–10.3)
CO2: 25 mmol/L (ref 22–32)
Chloride: 107 mmol/L (ref 101–111)
Creatinine, Ser: 3.53 mg/dL — ABNORMAL HIGH (ref 0.61–1.24)
GFR, EST AFRICAN AMERICAN: 20 mL/min — AB (ref >60)
GFR, EST NON AFRICAN AMERICAN: 17 mL/min — AB (ref >60)
GLUCOSE: 160 mg/dL — AB (ref 65–99)
Potassium: 3.9 mmol/L (ref 3.5–5.1)
Sodium: 144 mmol/L (ref 135–145)

## 2016-02-11 LAB — MAGNESIUM: MAGNESIUM: 3.1 mg/dL — AB (ref 1.7–2.4)

## 2016-02-11 LAB — PREPARE RBC (CROSSMATCH)

## 2016-02-11 LAB — TSH: TSH: 2.861 u[IU]/mL (ref 0.350–4.500)

## 2016-02-11 LAB — CK
Total CK: 5860 U/L — ABNORMAL HIGH (ref 49–397)
Total CK: 5900 U/L — ABNORMAL HIGH (ref 49–397)
Total CK: 6520 U/L — ABNORMAL HIGH (ref 49–397)
Total CK: 7202 U/L — ABNORMAL HIGH (ref 49–397)

## 2016-02-11 LAB — ABO/RH: ABO/RH(D): B POS

## 2016-02-11 SURGERY — RADIOLOGY WITH ANESTHESIA
Anesthesia: Monitor Anesthesia Care

## 2016-02-11 MED ORDER — FENTANYL CITRATE (PF) 100 MCG/2ML IJ SOLN: 25.0000 ug | INTRAMUSCULAR | Status: DC | PRN

## 2016-02-11 MED ORDER — SODIUM CHLORIDE 0.9 % IV SOLN
Freq: Once | INTRAVENOUS | Status: AC
  Administered 2016-02-11: 14:00:00 via INTRAVENOUS

## 2016-02-11 MED ORDER — METOCLOPRAMIDE HCL 5 MG/ML IJ SOLN: 10.0000 mg | Freq: Once | INTRAMUSCULAR | Status: DC | PRN

## 2016-02-11 MED ORDER — MEPERIDINE HCL 25 MG/ML IJ SOLN: 6.2500 mg | INTRAMUSCULAR | Status: DC | PRN

## 2016-02-11 MED ORDER — FUROSEMIDE 10 MG/ML IJ SOLN
40.0000 mg | Freq: Once | INTRAMUSCULAR | Status: AC
  Administered 2016-02-11: 40 mg via INTRAVENOUS
  Filled 2016-02-11: qty 4

## 2016-02-11 NOTE — Progress Notes (Signed)
Patient Name: Eric [name] Date of Encounter: 02/11/2016  Principal Problem:   Cardiac arrest [facility]) Active Problems:   Chronic systolic heart failure ([place])   Hyperlipidemia   Left bundle branch block   Obesity (BMI 30-39.9)   Atrial flutter ([place])   Acute respiratory failure ([place])   Encounter for central line placement   Arrhythmia   Altered mental status   HCAP (healthcare-associated pneumonia)   AKI (acute kidney injury) ([place])   Ventricular fibrillation ([place])   Aortic aneurysm without rupture ([facility])   Aortic insufficiency   Back pain   Leg weakness, bilateral   Paroxysmal atrial fibrillation ([place])   Length of Stay: 10  SUBJECTIVE Voice has improved, per pt's request condom cath was placed. He is going to have lumbar MRI done this morning  Tele: NSR, HR 92   CURRENT MEDS . amiodarone  400 mg Oral BID  . carvedilol  37.5 mg Oral BID WC  . docusate sodium  100 mg Oral BID  . feeding supplement (ENSURE ENLIVE)  237 mL Oral BID BM  . insulin aspart  0-5 Units Subcutaneous QHS  . insulin aspart  0-9 Units Subcutaneous TID WC  . isosorbide-hydrALAZINE  0.5 tablet Oral TID  . levothyroxine  75 mcg Oral QAC breakfast  . polyethylene glycol  17 g Oral Daily    OBJECTIVE   Intake/Output Summary (Last 24 hours) at 02/11/16 0805 Last data filed at 02/11/16 0600  Gross per 24 hour  Intake 1694.34 ml  Output    750 ml  Net 944.34 ml   Filed Weights   02/06/16 0428 02/07/16 0411 02/11/16 0500  Weight: 245 lb 9.5 oz (111.4 kg) 237 lb 10.5 oz (107.8 kg) 241 lb 13.5 oz (109.7 kg)    PHYSICAL EXAM Filed Vitals:   02/11/16 0500 02/11/16 0709 02/11/16 0740 02/11/16 0753  BP:   116/77 116/77  Pulse:   100 93  Temp:  98.4 F (36.9 C)  98.4 F (36.9 C)  TempSrc:  Oral  Oral  Resp:   12 12  Height:      Weight: 241 lb 13.5 oz (109.7 kg)     SpO2:   97% 98%   General: Alert, oriented x3, no distress Head: EOMI, moist mucous membranes Pulm: clear to auscultation, no  crackles or rhonchi Cardiovascular: RRR, no murmurs or gallops Abdomen: no tenderness or distention, no masses by palpation, no abnormal pulsatility or arterial bruits, normal bowel sounds, no hepatosplenomegaly Extremities: no clubbing, cyanosis or edema; Neurological: CN 2-12 grossly intact Skin: warm and dry  LABS  CBC  Recent Labs  02/10/16 1200 02/11/16 0406  WBC 20.9* 26.6*  NEUTROABS  --  20.7*  HGB 9.5* 7.1*  HCT 29.4* 21.9*  MCV 94.2 95.2  PLT 274 [id number]   Basic Metabolic Panel  Recent Labs  02/09/16 0723 02/10/16 0250 02/11/16 0406  NA 146* 144 144  K 4.4 4.2 3.9  CL 108 109 107  CO2 22 21* 25  GLUCOSE 138* 121* 160*  BUN 81* 98* 116*  CREATININE 2.87* 3.15* 3.53*  CALCIUM 9.1 8.6* 8.3*  MG 2.9* 2.8* 3.1*  PHOS 4.9* 5.5*  --    Liver Function Tests  Recent Labs  02/10/16 0827  AST 164*  ALT 56  ALKPHOS 42  BILITOT 1.1  PROT 7.6  ALBUMIN 2.7*   No results for input(s): LIPASE, AMYLASE in the last 72 hours. Cardiac Enzymes  Recent Labs  02/10/16 1547 02/10/16 2336 02/11/16 0406  CKTOTAL  [place].[id number]*    Radiology Studies Imaging results have been reviewed    ASSESSMENT AND PLAN  1. S/P cardiac arrest, presumed VT/VF VF on AED interrogation. Plan for CRT-D on this admission, secondary prevention. Normal electrolytes on admission. Moderate to severely depressed LVEF despite chronic comprehensive medical treatment. - will need cardiac cath as renal function once renal function improved.   2. Chronic systolic heart failure-TEE 5/17 reveals EF of 99991111, grade 2 diastolic dysfunction. Holding lasix due to AKI, net +1.315L since admission.   3. 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 TEE yesterday revealed severe aortic regurgitation with eccentric jet.   4. Atrial fibrillation-- now in sinus rhythm. On home coreg 25mg  BID. On IV heparin, home DOAC has been stopped. Previous  consideration was given to ablation for flutter, but not felt to be helpful. Try to keep on IV heparin until after cath and ICD implantation. Continue amiodarone po 400mg  BID  5. Hypertension- controlled, continue coreg, resumed bidil 20-37.5mg  yesterday.   6. Aortic root enlargement - aortic root measures 106mm, ascending aorta 24mm -Will need CT Angio when renal function is clearly stabilized.  7. 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 CK trending up. Likely rhabdo cause of renal failure, nephro following. Back pain and muscle weakness could be due to rhabo as well. Checking ANA in case of polymyositis.   8. Left bundle branch block typical LBBB with QRS duration 162ms predicts excellent response to CRT, especially if he has nonischemic CMP.  9. Hyperlipidemia - stopped statin due to rhabdo  10. Possible aspiration pneumonia- completed course of abx. CXR 5/18 neg for infection.   11. Leukocytosis -- WBC 26.6 from 20.9 today. UA negative. Pt has been afebrile, blood cultures repeated 5/18 NGTD, urine culture 5/18 pending, blood cutlures from 5/12 NGTD. His portable CXR yesterday was neg for infection. Possible diskitis or spinal source, he is getting a lumbar MRI w/ general sedation this morning, will f/u results.   12. Anemia-- hgb 7.1 today from 9.5 yesterday. Plts stable. Unclear cause. FOBT negative. Possibly related to AKI vs retroperitoneal bleed as he has been on a hep gtt since admission. Follow MRI lumbar spine results, can order CT abd/pelvis. Repeat CBC at noon today.    Julious Oka, MD 02/11/2016 8:05 AM  I have seen and examined the patient along with Julious Oka, MD.  I have reviewed the chart, notes and new data.  I agree with her note, with additional information below.  Reportedly. his lumbar spine MRI shows a retroperitoneal hematoma.  Key new complaints: he still has a lot of back pain. He has just returned from  MRI and he required general anesthesia for that procedure. He is still sleepy, but easily awoken and is fully oriented Key examination changes: abdomen appears mildly distended, otherwise no change Key new findings / data: worsening anemia and leukocytosis and rising CK are likely all related to bleeding in iliopsoas, although I have not yet been able to obtain confirmation that this diagnosis is accurate. Volume loss could also explain worsening renal function.  PLAN: Heparin stopped. Will transfuse 2 units PRBC, add diuretics. Get full report on MRI.  Sanda Klein, MD, [place] [phone] 02/11/2016, 11:51 AM

## 2016-02-11 NOTE — Anesthesia Preprocedure Evaluation (Addendum)
Anesthesia Evaluation    Airway Mallampati: II  TM Distance: >3 FB Neck ROM: Full    Dental no notable dental hx.    Pulmonary    Pulmonary exam normal breath sounds clear to auscultation       Cardiovascular hypertension, Pt. on medications + Peripheral Vascular Disease (4.9 cm AAA) and +CHF  Normal cardiovascular exam+ dysrhythmias Atrial Fibrillation and Ventricular Tachycardia + Valvular Problems/Murmurs (severe AI) AI  Rhythm:Regular Rate:Normal  TEE 02/08/16: - Left ventricle: Systolic function was moderately to severely reduced. The estimated ejection fraction was in the range of 30% to 35%. Diffuse hypokinesis. - Aortic valve: No evidence of vegetation. There was moderate regurgitation. - Mitral valve: No evidence of vegetation. - Left atrium: The atrium was moderately dilated. There was spontaneous echo contrast ("smoke"). - Right atrium: No evidence of thrombus in the atrial cavity or appendage. - Atrial septum: No defect or patent foramen ovale was identified. There was an atrial septal aneurysm. - Tricuspid valve: No evidence of vegetation. - Pulmonic valve: No evidence of vegetation.   Neuro/Psych Acute encephalopathy    GI/Hepatic   Endo/Other    Renal/GU CRFRenal disease     Musculoskeletal   Abdominal   Peds  Hematology  (+) anemia ,   Anesthesia Other Findings Recent cardiac arrest.  Reproductive/Obstetrics                            Anesthesia Physical Anesthesia Plan  ASA: IV  Anesthesia Plan: MAC   Post-op Pain Management:    Induction: Intravenous  Airway Management Planned: Simple Face Mask and Nasal Cannula  Additional Equipment:   Intra-op Plan:   Post-operative Plan:   Informed Consent: I have reviewed the patients History and Physical, chart, labs and discussed the procedure including the risks, benefits and alternatives for the proposed anesthesia  with the patient or authorized representative who has indicated his/her understanding and acceptance.   Dental advisory given  Plan Discussed with: CRNA  Anesthesia Plan Comments: (Precedex infusion. MAC. Pt is claustrophobic.  High risk for complications given medical problems. )        Anesthesia Quick Evaluation

## 2016-02-11 NOTE — Progress Notes (Signed)
PROGRESS NOTE    Eric [name]  [id number] DOB: November 06, 1950 DOA: 01/31/2016 PCP: [name], MD   Brief Narrative:  Eric [name] is a 65 y.o. BM PMHx Chronic Systolic CHF, A-.fib on Eliquis, HTN,Heart murmur,Dysrhythmia, CKD, Hypothyroidism, Chronic Pain Syndrome (low back), .   He was in his USOH until evening of 05/08 when he was with his wife at [place]. As they were leaving [employer], he began driving the car and while still in the parking lot, he began to breath very heavily before becoming unresponsive. His wife immediately got the car stopped, got him out of the car and began CPR while calling EMS. She feels that EMS arrived within 5 - 10 minutes and upon their arrival, he received 2 defibrillations via AED. ROSC was achieved within roughly 15 - 20 minutes.  Upon ED arrival, he remained unresponsive and was subsequently intubated. PCCM was called for admission and consideration of hypothermia protocol. Cardiology was also called in consultation.  Per his wife, he had not had any complaints recently and had behaved completely normal. 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   Assessment & Plan:   Principal Problem:   Cardiac arrest [facility]) Active Problems:   Chronic systolic heart failure (HCC)   Hyperlipidemia   Left bundle branch block   Obesity (BMI 30-39.9)   Atrial flutter ([place])   Acute respiratory failure ([place])   Encounter for central line placement   Arrhythmia   Altered mental status   HCAP (healthcare-associated pneumonia)   AKI (acute kidney injury) ([place])   Ventricular fibrillation (HCC)   Aortic aneurysm without rupture [facility])   Aortic insufficiency   Back pain   Leg weakness, bilateral   Paroxysmal atrial fibrillation (HCC)   Pneumonia   Acute lumbar back pain   Non-traumatic rhabdomyolysis   Retroperitoneal bleed  V. fib Cardiac arrest  - CPR performed by wife, multiple  shocks -Hypothermic protocol performed -Stable  Chronic systolic CHF  -(EF [id number] per echo from April 2017) -Strict I and O's since admission +1.6 L -Daily weight  Filed Weights   02/06/16 0428 02/07/16 0411 02/11/16 0500  Weight: 111.4 kg (245 lb 9.5 oz) 107.8 kg (237 lb 10.5 oz) 109.7 kg (241 lb 13.5 oz)  -Coreg 37.5 mg BID -BiDil 20-30 7.5 mg 0.5 tablet TID -Transfuse for hemoglobin<7 -5/19 transfuse 2 units PRBC  Aortic aneurysm -4.9 cm aortic aneurysm -- will need f/u CT of chest once extubated and Cr improved   HTN  -Patient actually borderline hypotensive -See CHF  Paroxysmal A.fib  -See CHF -Hold all anticoagulants secondary to acute blood loss anemia  Aspiration Pneumonia positive CITROBACTER KOSERI -Completed 7 day course antibiotics  -Patient with continued leukocytosis, however afebrile. Monitor closely off antibiotics  Acute encephalopathy  -Resolved  New-onset bilateral LE weakness and numbness -MRI L-spine    Acute on chronic renal failure (baselineCr~1.9)  Lab Results  Component Value Date   CREATININE 3.53* 02/11/2016   CREATININE 3.15* 02/10/2016   CREATININE 2.87* 02/09/2016   Hyperkalemia -resolved  Rhabdomyolysis -Atorvastatin stopped 5/17 -Patient's CK climbing and after 10 days cannot blame V. fib arrest. 5/19 CK continues to climb. -Repeat CK q 6hr -Sodium Bicarb 75 ml/hr; Held by nephrology, secondary patient obtaining 2 units RBC   Nutrition -Dysphagia 3 nectar thick  Hypothyroidism. -Synthroid 75 g daily  -TSH pending  Acute blood loss anemia/Bilateral Retroperitoneal [id number] Recent Labs Lab 02/09/16 0723 02/10/16 0250 02/10/16 1200 02/11/16  0406 02/11/16 1213  HGB 10.7* 8.8* 9.5* 7.1* 6.7*  -5/19 transfuse 2 units PRBC -Hold any anticoagulant -H/H q 4hr -Patient not able to have abdominal/pelvic CT with contrast secondary to renal function -Normally retroperitoneal bleeds self tamponade. If patient's H/H does  not stabilize will need to consult surgery, exploratory laparoscopy?    DVT prophylaxis: SCD Code Status: Full Family Communication: None available Disposition Plan: Resolution rhabdomyolysis, workup for lower extremity weakness   Consultants:  Stefano Gaul nephrology    Procedures/Significant Events:  05/08 - admitted after cardiac arrest. Started on TTM - 33 degrees C CXR 05/08 >>no acute process. CT head 05/08 >>no acute process Echo 5/9>>EF 30 to 35%, severe aortic regurgitation EEG 5/9>>no focal, hemispheric, or lateralizing features; demonstrated a burst/suppression pattern, which can be consistent with a sedated EEG 5/11>>mod diffuse slowing, No epileptiform activity was recorded. 5/10-pt rewarmed per hypothermia protocol 5/14 >> Extubated Hip spine films 5/15 > no acute abnormality 5/16 TEE;LVEF= 30%- 35%. Diffuse hypokinesis. - Aortic valve: Moderate regurgitation.- Left atrium: Moderately dilated.  5/19 MRI L-spine; Bilateral retroperitoneal hemorrhage involving the psoas musculature. Follow-up CT Abdomen and Pelvis could characterize further.   Cultures Blood 05/08 >>no growth 5/11>> Urine 05/08 >>no growth 5/10 Sputum 05/08 >>few GPC, few GNR>>> 5/12 sputum positive CITROBACTER KOSERI MRSA PCR 5/9>>Negative BCx2 5/12>>> 5/18 blood pending 5/18 urine pending  Antimicrobials: Zosyn 5/11>> 5/16 Vancomycin 5/11>> 5/14 Ceftriaxone 5/16 >> 5/18   Devices    LINES / TUBES:      Continuous Infusions: . sodium chloride 10 mL/hr at 02/11/16 1757     Subjective: 5/18 A/O 4, NAD.  Objective: Filed Vitals:   02/11/16 1700 02/11/16 1706 02/11/16 1730 02/11/16 1800  BP:    103/58  Pulse: 87 87  94  Temp:  97.6 F (36.4 C)    TempSrc:  Oral    Resp: 21 18 0 21  Height:      Weight:      SpO2: 99% 100%  100%    Intake/Output Summary (Last 24 hours) at 02/11/16 1943 Last data filed at 02/11/16 1706  Gross per 24 hour  Intake 1431.18 ml   Output    700 ml  Net 731.18 ml   Filed Weights   02/06/16 0428 02/07/16 0411 02/11/16 0500  Weight: 111.4 kg (245 lb 9.5 oz) 107.8 kg (237 lb 10.5 oz) 109.7 kg (241 lb 13.5 oz)    Examination:  General: A/O 4, NAD, No acute respiratory distress Eyes: negative scleral hemorrhage, negative anisocoria, negative icterus ENT: Negative Runny nose, negative gingival bleeding, Neck:  Negative scars, masses, torticollis, lymphadenopathy, JVD Lungs: Clear to auscultation bilaterally without wheezes or crackles Cardiovascular: Regular rate and rhythm without murmur gallop or rub normal S1 and S2 Abdomen: negative abdominal pain, nondistended, positive soft, bowel sounds, no rebound, no ascites, no appreciable mass Extremities: No significant cyanosis, clubbing, or edema bilateral lower extremities Skin: Negative rashes, lesions, ulcers Psychiatric:  Negative depression, negative anxiety, negative fatigue, negative mania Musculoskeletal; pain to palpation midline L2-S1, pain palpation left lumbosacral area.  Central nervous system:  Cranial nerves II through XII intact, tongue/uvula midline, upper extremities muscle strength 5/5, Bilat lower extremity strength 3/5, sensation intact throughout, quick finger touch bilateral within normal limits,  negative dysarthria, negative expressive aphasia, negative receptive aphasia.  .     Data Reviewed: Care during the described time interval was provided by me .  I have reviewed this patient's available data, including medical history, events of note,  physical examination, and all test results as part of my evaluation. I have personally reviewed and interpreted all radiology studies.  CBC:  Recent Labs Lab 02/09/16 0723 02/10/16 0250 02/10/16 1200 02/11/16 0406 02/11/16 1213  WBC 16.3* 17.8* 20.9* 26.6* 24.2*  NEUTROABS  --   --   --  20.7*  --   HGB 10.7* 8.8* 9.5* 7.1* 6.7*  HCT 32.9* 27.2* 29.4* 21.9* 21.0*  MCV 95.4 94.4 94.2 95.2 95.5   PLT 271 257 274 279 [id number]   Basic Metabolic Panel:  Recent Labs Lab 02/05/16 1522  02/07/16 0415 02/08/16 0418 02/09/16 0723 02/10/16 0250 02/11/16 0406  NA 143  < > 142 144 146* 144 144  K 4.9  < > 4.1 3.9 4.4 4.2 3.9  CL 112*  < > 107 107 108 109 107  CO2 20*  < > 24 25 22  21* 25  GLUCOSE 185*  < > 148* 147* 138* 121* 160*  BUN 52*  < > 50* 58* 81* 98* 116*  CREATININE 2.31*  < > 2.24* 2.34* 2.87* 3.15* 3.53*  CALCIUM 9.5  < > 9.1 9.2 9.1 8.6* 8.3*  MG 2.4  --   --   --  2.9* 2.8* 3.1*  PHOS 3.2  --   --   --  4.9* 5.5*  --   < > = values in this interval not displayed. GFR: Estimated Creatinine Clearance: 25.8 mL/min (by C-G formula based on Cr of 3.53). Liver Function Tests:  Recent Labs Lab 02/10/16 0827  AST 164*  ALT 56  ALKPHOS 42  BILITOT 1.1  PROT 7.6  ALBUMIN 2.7*   No results for input(s): LIPASE, AMYLASE in the last 168 hours. No results for input(s): AMMONIA in the last 168 hours. Coagulation Profile:  Recent Labs Lab 02/11/16 1326  INR 1.42   Cardiac Enzymes:  Recent Labs Lab 02/05/16 1522  02/10/16 0827 02/10/16 1547 02/10/16 2336 02/11/16 0406 02/11/16 1213  CKTOTAL  --   < > 5788* 5835* 5860* 6520* 7202*  TROPONINI 0.08*  --   --   --   --   --   --   < > = values in this interval not displayed. BNP (last 3 results) No results for input(s): PROBNP in the last 8760 hours. HbA1C: No results for input(s): HGBA1C in the last 72 hours. CBG:  Recent Labs Lab 02/10/16 1635 02/10/16 2214 02/11/16 0752 02/11/16 1146 02/11/16 1716  GLUCAP 149* 210* 169* 203* 181*   Lipid Profile: No results for input(s): CHOL, HDL, LDLCALC, TRIG, CHOLHDL, LDLDIRECT in the last 72 hours. Thyroid Function Tests:  Recent Labs  02/11/16 0406  TSH 2.861   Anemia Panel: No results for input(s): VITAMINB12, FOLATE, FERRITIN, TIBC, IRON, RETICCTPCT in the last 72 hours. Urine analysis:    Component Value Date/Time   COLORURINE YELLOW 02/10/2016  1802   APPEARANCEUR CLEAR 02/10/2016 1802   LABSPEC 1.021 02/10/2016 1802   PHURINE 5.0 02/10/2016 1802   GLUCOSEU NEGATIVE 02/10/2016 1802   HGBUR NEGATIVE 02/10/2016 1802   BILIRUBINUR NEGATIVE 02/10/2016 1802   KETONESUR NEGATIVE 02/10/2016 1802   PROTEINUR NEGATIVE 02/10/2016 1802   NITRITE NEGATIVE 02/10/2016 1802   LEUKOCYTESUR NEGATIVE 02/10/2016 1802   Sepsis Labs: @LABRCNTIP (procalcitonin:4,lacticidven:4)  ) Recent Results (from the past 240 hour(s))  Culture, Urine     Status: None   Collection Time: 02/04/16 12:11 PM  Result Value Ref Range Status   Specimen Description URINE, CATHETERIZED  Final   Special Requests Normal  Final   Culture NO GROWTH  Final   Report Status 02/05/2016 FINAL  Final  Culture, respiratory (NON-Expectorated)     Status: None   Collection Time: 02/04/16 12:16 PM  Result Value Ref Range Status   Specimen Description TRACHEAL ASPIRATE  Final   Special Requests Normal  Final   Gram Stain   Final    ABUNDANT WBC PRESENT,BOTH PMN AND MONONUCLEAR FEW SQUAMOUS EPITHELIAL CELLS PRESENT FEW GRAM POSITIVE COCCI IN PAIRS IN CLUSTERS FEW GRAM NEGATIVE RODS Performed at [facility]    Culture   Final    MODERATE CITROBACTER KOSERI Performed at [facility]    Report Status 02/07/2016 FINAL  Final   Organism ID, Bacteria CITROBACTER KOSERI  Final      Susceptibility   Citrobacter koseri - MIC*    CEFEPIME <=1 SENSITIVE Sensitive     CEFTAZIDIME <=1 SENSITIVE Sensitive     CEFTRIAXONE <=1 SENSITIVE Sensitive     CIPROFLOXACIN <=0.25 SENSITIVE Sensitive     GENTAMICIN <=1 SENSITIVE Sensitive     IMIPENEM <=0.25 SENSITIVE Sensitive     PIP/TAZO <=4 SENSITIVE Sensitive     TOBRAMYCIN <=1 SENSITIVE Sensitive     TRIMETH/SULFA Value in next row Sensitive      <=20 SENSITIVE(NOTE)    * MODERATE CITROBACTER KOSERI  Culture, blood (routine x 2)     Status: None   Collection Time: 02/04/16  1:50 PM  Result Value Ref Range Status     Specimen Description BLOOD LEFT HAND  Final   Special Requests IN PEDIATRIC BOTTLE 2CC  Final   Culture NO GROWTH 5 DAYS  Final   Report Status 02/09/2016 FINAL  Final  Culture, blood (routine x 2)     Status: None   Collection Time: 02/04/16  2:08 PM  Result Value Ref Range Status   Specimen Description BLOOD RIGHT HAND  Final   Special Requests IN PEDIATRIC BOTTLE .5CC  Final   Culture NO GROWTH 5 DAYS  Final   Report Status 02/09/2016 FINAL  Final  Culture, blood (Routine X 2) w Reflex to ID Panel     Status: None (Preliminary result)   Collection Time: 02/10/16  8:40 AM  Result Value Ref Range Status   Specimen Description BLOOD RIGHT HAND  Final   Special Requests BOTTLES DRAWN AEROBIC ONLY 4CC  Final   Culture NO GROWTH 1 DAY  Final   Report Status PENDING  Incomplete  Culture, blood (Routine X 2) w Reflex to ID Panel     Status: None (Preliminary result)   Collection Time: 02/10/16  9:00 AM  Result Value Ref Range Status   Specimen Description BLOOD LEFT HAND  Final   Special Requests IN PEDIATRIC BOTTLE 1.5CC  Final   Culture NO GROWTH 1 DAY  Final   Report Status PENDING  Incomplete         Radiology Studies: Mr Lumbar Spine Wo Contrast  02/11/2016  CLINICAL DATA:  65 year old male status post collapse and cardiac arrest. Abrupt drop in hemoglobin from 9 to and 7 with associated new severe low back pain radiating to the hips and down both lower extremities. Initial encounter. EXAM: MRI LUMBAR SPINE WITHOUT CONTRAST - LIMITED TECHNIQUE: Multiplanar, multisequence MR imaging of the lumbar spine was performed. No intravenous contrast was administered. COMPARISON:  Lumbar radiographs 02/07/2016 FINDINGS: I was contacted during the initial stages of attempted lumbar MRI by the technologist. This study was being attempted with the help of Anesthesia.  The patient was to unstable for general anesthesia, and was not tolerating MRI well. The scout images demonstrate bilateral psoas  muscle retroperitoneal hemorrhage (series 2, image 5 and image 20). No definite acute findings in the visible lumbar spine which is primarily seen sagittally on series 2, image 12. At 1024 hours I discussed the above with the Anesthesiologist. We agreed that the patient would be better served with CT Abdomen and Pelvis and decision was made to discontinue additional MRI imaging. IMPRESSION: Bilateral retroperitoneal hemorrhage involving the psoas musculature. Follow-up CT Abdomen and Pelvis could characterize further. Lumbar MRI discontinued prior to completion as described above. This was discussed by telephone with Anesthesia at 1024 hours, and again with Dr. Dani Gobble Croitoru On 02/11/2016 at 1315 hours. Electronically Signed   By: Genevie Ann M.D.   On: 02/11/2016 13:27   Dg Chest Port 1 View  02/10/2016  CLINICAL DATA:  Shortness of breath and leukocytosis. EXAM: PORTABLE CHEST 1 VIEW COMPARISON:  02/07/2016 FINDINGS: Lungs are adequately inflated without consolidation or effusion. Mild stable cardiomegaly. Minimal degenerative change of the spine. IMPRESSION: No acute cardiopulmonary disease. Mild stable cardiomegaly. Electronically Signed   By: Marin Olp M.D.   On: 02/10/2016 09:54        Scheduled Meds: . amiodarone  400 mg Oral BID  . carvedilol  37.5 mg Oral BID WC  . docusate sodium  100 mg Oral BID  . feeding supplement (ENSURE ENLIVE)  237 mL Oral BID BM  . insulin aspart  0-5 Units Subcutaneous QHS  . insulin aspart  0-9 Units Subcutaneous TID WC  . isosorbide-hydrALAZINE  0.5 tablet Oral TID  . levothyroxine  75 mcg Oral QAC breakfast  . polyethylene glycol  17 g Oral Daily   Continuous Infusions: . sodium chloride 10 mL/hr at 02/11/16 1757     LOS: 10 days    Time spent: 40 minutes    Joelyn Lover, Geraldo Docker, MD [facility] Pager [phone]   If 7PM-7AM, please contact night-coverage [email or web address] Password [facility] 02/11/2016, 7:43 PM

## 2016-02-11 NOTE — Progress Notes (Signed)
Inpatient Rehabilitation  Met with patient to discuss team's recommendation for IP Rehab post his acute care stay.  Note that acute medical work up is on going.  Patient asked appropriate questions about the process and stated that I could also discuss the rehab program with his wife.  I called patient's wife, Von and left a message.  Plan for my co-worker Gerlean Ren will follow up Monday.   Carmelia Roller., CCC/SLP Admission Coordinator  [place]  Cell [phone]

## 2016-02-11 NOTE — Transfer of Care (Signed)
Immediate Anesthesia Transfer of Care Note  Patient: Eric [name]  Procedure(s) Performed: Procedure(s) with comments: MRI OF THE BRAIN WITHOUT CONTRAST, LUMBAR WITHOUT CONTRAST (N/A) - DR. WOOD/MRI  Patient Location: PACU  Anesthesia Type:MAC  Level of Consciousness: patient cooperative and responds to stimulation  Airway & Oxygen Therapy: Patient Spontanous Breathing and Patient connected to nasal cannula oxygen  Post-op Assessment: Report given to RN, Post -op Vital signs reviewed and stable and Patient moving all extremities X 4  Post vital signs: Reviewed and stable  Last Vitals:  Filed Vitals:   02/11/16 1039 02/11/16 1045  BP:  109/56  Pulse:  82  Temp: 36.7 C   Resp:  15    Last Pain:  Filed Vitals:   02/11/16 1058  PainSc: Asleep      Patients Stated Pain Goal: 1 ([id number])  Complications: No apparent anesthesia complications

## 2016-02-11 NOTE — Progress Notes (Signed)
PT Cancellation Note  Patient Details Name: Eric [name] MRN: [id number] DOB: [birth year]   Cancelled Treatment:    Reason Eval/Treat Not Completed: Patient at procedure or test/unavailable (Pt currently off floor.  To have lumbar MRI today.)  PT will continue to follow acutely.  Collie Siad PT, DPT  Pager: [phone] Phone: [phone] 02/11/2016, 9:03 AM

## 2016-02-11 NOTE — Progress Notes (Signed)
[place] for Heparin Indication: atrial fibrillation  Allergies  Allergen Reactions  . Lactose Intolerance (Gi) Diarrhea    Patient Measurements: Height: 5\' 9"  (175.3 cm) Weight: 237 lb 10.5 oz (107.8 kg) IBW/kg (Calculated) : 70.7 Heparin Dosing Weight: 95kg   Vital Signs: Temp: 98.5 F (36.9 C) (05/19 0000) Temp Source: Oral (05/19 0000) BP: 103/67 mmHg (05/19 0200) Pulse Rate: 104 (05/19 0200)  Labs:  Recent Labs  02/09/16 0723  02/10/16 0250 02/10/16 0827 02/10/16 1200 02/10/16 1547 02/10/16 2336 02/11/16 0406  HGB 10.7*  --  8.8*  --  9.5*  --   --   --   HCT 32.9*  --  27.2*  --  29.4*  --   --   --   PLT 271  --  257  --  274  --   --   --   HEPARINUNFRC 0.57  --  0.53  --   --   --   --  0.28*  CREATININE 2.87*  --  3.15*  --   --   --   --   --   CKTOTAL  --   < >  --  5788*  --  5835* 5860*  --   < > = values in this interval not displayed.  Estimated Creatinine Clearance: 28.7 mL/min (by C-G formula based on Cr of 3.15).  Assessment: 65 y.o. male admitted s/p cardiac arrest, h/o Afib with Eliquis PTA (last dose 5/8). Pt had hemoptysis and hematuria while on heparin therefore heparin held on 5/12, restarted 5/14. Heparin level now down to slightly subtherapeutic on gtt at 800 units/hr. No issues with line or bleeding reported per RN.   Goal of Therapy:  Heparin level 0.3-0.7 units/ml Monitor platelets by anticoagulation protocol: Yes   Plan:  Increase heparin to 950 units/hr Will f/u 6 hr heparin level  Sherlon Handing, PharmD, BCPS Clinical pharmacist, pager [phone] 02/11/2016 5:26 AM

## 2016-02-11 NOTE — Progress Notes (Signed)
Admit: 01/31/2016 LOS: 10  44M with out of hospital cardiac arrest 5/8 with AoCKD4.  Subjective:  SCr and BUN further up Hb down quite a big APparently, prelim of MRI spien is of RP hematoma CK up further to 6520 Adequate UOP K and HCO3 ok    05/18 0701 - 05/19 0700 In: 1864.8 [P.O.:180; I.V.:1684.8] Out: 750 [Urine:750]  Filed Weights   02/06/16 0428 02/07/16 0411 02/11/16 0500  Weight: 111.4 kg (245 lb 9.5 oz) 107.8 kg (237 lb 10.5 oz) 109.7 kg (241 lb 13.5 oz)    Scheduled Meds: . sodium chloride   Intravenous Once  . amiodarone  400 mg Oral BID  . carvedilol  37.5 mg Oral BID WC  . docusate sodium  100 mg Oral BID  . feeding supplement (ENSURE ENLIVE)  237 mL Oral BID BM  . furosemide  40 mg Intravenous Once  . insulin aspart  0-5 Units Subcutaneous QHS  . insulin aspart  0-9 Units Subcutaneous TID WC  . isosorbide-hydrALAZINE  0.5 tablet Oral TID  . levothyroxine  75 mcg Oral QAC breakfast  . polyethylene glycol  17 g Oral Daily   Continuous Infusions: . sodium chloride 10 mL/hr at 02/11/16 0830  . heparin Stopped (02/11/16 1200)   PRN Meds:.acetaminophen, bisacodyl, bisacodyl, fentaNYL (SUBLIMAZE) injection, hydrALAZINE, labetalol, meperidine (DEMEROL) injection, metoCLOPramide, metoprolol, morphine injection, oxyCODONE-acetaminophen  Current Labs: reviewed    Physical Exam:  Blood pressure 118/65, pulse 81, temperature 98.5 F (36.9 C), temperature source Oral, resp. rate 21, height 5\' 9"  (1.753 m), weight 109.7 kg (241 lb 13.5 oz), SpO2 99 %. GEN: NAD ENT: NCAT EYES: EOMI CV: IRIR PULM: diminished in bases ABD: s/nt/nd SKIN: No rashes/lesions EXT:No LEE Nonfocal. AAOx3, CN2-12 intact  RENAL STUDIES 1. 5/17 Renal US: no structural or obstructive issues 2. 5/17 CK elevated 3457 3. 5/18 CK 5788; AST elevated as well 4. 5/19 CK 6520 5. 5/19 MRI Lumbar spine  A 1. AoCKD4 1. Looks like related to mild rhabdo 2. Renal US w/o obstruction  3.  Hold  on urinary alkalinization rigth now with transfusion today + sCHF 4. Check UA for Hb and pH  5. Sees Powell at [employer] 6. Daily weights, Daily Renal Panel, Strict I/Os, Avoid nephrotoxins (NSAIDs, judicious IV Contrast)  2. Rhabdomyolysis 1. Statin stopped 5/17 2. ? Related to RP hematoma as well 2. Cardiac Arrest 5/8: needs LHC and ICD 3. HTN: BP stable on carvediolol 4. Leg pain -- ? Statin related rhabdo, MRI pending 5. Aortic Insufficiency 6. AFib : heparin held 7. Systolic CHF 8. HCAP s/p ABX   Pearson Grippe MD 02/11/2016, 12:18 PM   Recent Labs Lab 02/05/16 1522  02/09/16 0723 02/10/16 0250 02/11/16 0406  NA 143  < > 146* 144 144  K 4.9  < > 4.4 4.2 3.9  CL 112*  < > 108 109 107  CO2 20*  < > 22 21* 25  GLUCOSE 185*  < > 138* 121* 160*  BUN 52*  < > 81* 98* 116*  CREATININE 2.31*  < > 2.87* 3.15* 3.53*  CALCIUM 9.5  < > 9.1 8.6* 8.3*  PHOS 3.2  --  4.9* 5.5*  --   < > = values in this interval not displayed.  Recent Labs Lab 02/10/16 0250 02/10/16 1200 02/11/16 0406  WBC 17.8* 20.9* 26.6*  NEUTROABS  --   --  20.7*  HGB 8.8* 9.5* 7.1*  HCT 27.2* 29.4* 21.9*  MCV 94.4 94.2 95.2  PLT  257 274 279              

## 2016-02-12 DIAGNOSIS — I714 Abdominal aortic aneurysm, without rupture: Secondary | ICD-10-CM | POA: Diagnosis not present

## 2016-02-12 DIAGNOSIS — K661 Hemoperitoneum: Secondary | ICD-10-CM

## 2016-02-12 DIAGNOSIS — I712 Thoracic aortic aneurysm, without rupture: Secondary | ICD-10-CM | POA: Diagnosis not present

## 2016-02-12 DIAGNOSIS — M545 Low back pain: Secondary | ICD-10-CM

## 2016-02-12 DIAGNOSIS — N179 Acute kidney failure, unspecified: Secondary | ICD-10-CM | POA: Diagnosis not present

## 2016-02-12 DIAGNOSIS — I469 Cardiac arrest, cause unspecified: Secondary | ICD-10-CM | POA: Diagnosis not present

## 2016-02-12 LAB — TYPE AND SCREEN
ABO/RH(D): B POS
ANTIBODY SCREEN: NEGATIVE
Unit division: 0
Unit division: 0

## 2016-02-12 LAB — HEMOGLOBIN AND HEMATOCRIT, BLOOD
HCT: 30.8 % — ABNORMAL LOW (ref 39.0–52.0)
HEMATOCRIT: 26.2 % — AB (ref 39.0–52.0)
HEMATOCRIT: 26.3 % — AB (ref 39.0–52.0)
HEMATOCRIT: 26.3 % — AB (ref 39.0–52.0)
HEMATOCRIT: 26.8 % — AB (ref 39.0–52.0)
HEMATOCRIT: 27.3 % — AB (ref 39.0–52.0)
HEMOGLOBIN: 8.5 g/dL — AB (ref 13.0–17.0)
HEMOGLOBIN: 8.7 g/dL — AB (ref 13.0–17.0)
Hemoglobin: 8.4 g/dL — ABNORMAL LOW (ref 13.0–17.0)
Hemoglobin: 8.5 g/dL — ABNORMAL LOW (ref 13.0–17.0)
Hemoglobin: 8.6 g/dL — ABNORMAL LOW (ref 13.0–17.0)
Hemoglobin: 9.8 g/dL — ABNORMAL LOW (ref 13.0–17.0)

## 2016-02-12 LAB — CK
CK TOTAL: 3956 U/L — AB (ref 49–397)
Total CK: 4722 U/L — ABNORMAL HIGH (ref 49–397)
Total CK: 4909 U/L — ABNORMAL HIGH (ref 49–397)
Total CK: 3419 U/L — ABNORMAL HIGH (ref 49–397)

## 2016-02-12 LAB — CBC WITH DIFFERENTIAL/PLATELET
BASOS ABS: 0 10*3/uL (ref 0.0–0.1)
Basophils Relative: 0 %
EOS PCT: 1 %
Eosinophils Absolute: 0.2 10*3/uL (ref 0.0–0.7)
HEMATOCRIT: 26.3 % — AB (ref 39.0–52.0)
HEMOGLOBIN: 8.6 g/dL — AB (ref 13.0–17.0)
Lymphocytes Relative: 11 %
Lymphs Abs: 2.6 10*3/uL (ref 0.7–4.0)
MCH: 29.8 pg (ref 26.0–34.0)
MCHC: 32.7 g/dL (ref 30.0–36.0)
MCV: 91 fL (ref 78.0–100.0)
Monocytes Absolute: 2.6 10*3/uL — ABNORMAL HIGH (ref 0.1–1.0)
Monocytes Relative: 11 %
NEUTROS PCT: 77 %
Neutro Abs: 17.9 10*3/uL — ABNORMAL HIGH (ref 1.7–7.7)
Platelets: 277 10*3/uL (ref 150–400)
RBC: 2.89 MIL/uL — AB (ref 4.22–5.81)
RDW: 16.6 % — ABNORMAL HIGH (ref 11.5–15.5)
WBC: 23.3 10*3/uL — AB (ref 4.0–10.5)

## 2016-02-12 LAB — GLUCOSE, CAPILLARY
GLUCOSE-CAPILLARY: 210 mg/dL — AB (ref 65–99)
GLUCOSE-CAPILLARY: 206 mg/dL — AB (ref 65–99)
Glucose-Capillary: 200 mg/dL — ABNORMAL HIGH (ref 65–99)
Glucose-Capillary: 196 mg/dL — ABNORMAL HIGH (ref 65–99)

## 2016-02-12 LAB — URINE CULTURE

## 2016-02-12 LAB — HEMOGLOBIN A1C
HEMOGLOBIN A1C: 7.1 % — AB (ref 4.8–5.6)
MEAN PLASMA GLUCOSE: 157 mg/dL

## 2016-02-12 LAB — BASIC METABOLIC PANEL
ANION GAP: 13 (ref 5–15)
BUN: 100 mg/dL — AB (ref 6–20)
CHLORIDE: 105 mmol/L (ref 101–111)
CO2: 27 mmol/L (ref 22–32)
Calcium: 8.2 mg/dL — ABNORMAL LOW (ref 8.9–10.3)
Creatinine, Ser: 2.8 mg/dL — ABNORMAL HIGH (ref 0.61–1.24)
GFR, EST AFRICAN AMERICAN: 26 mL/min — AB (ref >60)
GFR, EST NON AFRICAN AMERICAN: 22 mL/min — AB (ref >60)
Glucose, Bld: 166 mg/dL — ABNORMAL HIGH (ref 65–99)
POTASSIUM: 4.1 mmol/L (ref 3.5–5.1)
SODIUM: 145 mmol/L (ref 135–145)

## 2016-02-12 LAB — MAGNESIUM: MAGNESIUM: 3 mg/dL — AB (ref 1.7–2.4)

## 2016-02-12 NOTE — Progress Notes (Signed)
Admit: 01/31/2016 LOS: 11  Eric [name] with out of hospital cardiac arrest 5/8 with AoCKD4.  Subjective:  MRI w/ b/l RP hematomas CK finally trending down SCr downtrending as well Good UOP Off heparin Still with back pain and thigh weakness    05/19 0701 - 05/20 0700 In: 638.2 [P.O.:120; I.V.:156.4; Blood:361.8] Out: 1700 [Urine:1700]  Filed Weights   02/07/16 0411 02/11/16 0500 02/12/16 0311  Weight: 107.8 kg (237 lb 10.5 oz) 109.7 kg (241 lb 13.5 oz) 113 kg (249 lb 1.9 oz)    Scheduled Meds: . amiodarone  400 mg Oral BID  . carvedilol  37.5 mg Oral BID WC  . docusate sodium  100 mg Oral BID  . feeding supplement (ENSURE ENLIVE)  237 mL Oral BID BM  . insulin aspart  0-5 Units Subcutaneous QHS  . insulin aspart  0-9 Units Subcutaneous TID WC  . isosorbide-hydrALAZINE  0.5 tablet Oral TID  . levothyroxine  75 mcg Oral QAC breakfast  . polyethylene glycol  17 g Oral Daily   Continuous Infusions: . sodium chloride 10 mL/hr at 02/11/16 1757   PRN Meds:.acetaminophen, bisacodyl, bisacodyl, fentaNYL (SUBLIMAZE) injection, hydrALAZINE, labetalol, meperidine (DEMEROL) injection, metoCLOPramide, metoprolol, morphine injection, oxyCODONE-acetaminophen  Current Labs: reviewed    Physical Exam:  Blood pressure 103/80, pulse 91, temperature 99.3 F (37.4 C), temperature source Oral, resp. rate 11, height 5\' 9"  (1.753 m), weight 113 kg (249 lb 1.9 oz), SpO2 99 %. GEN: NAD ENT: NCAT EYES: EOMI CV: IRIR PULM: diminished in bases ABD: s/nt/nd SKIN: No rashes/lesions EXT:No LEE Nonfocal. AAOx3, CN2-12 intact  RENAL STUDIES 1. 5/17 Renal US: no structural or obstructive issues 2. 5/17 CK elevated 3457 3. 5/18 CK 5788; AST elevated as well 4. 5/19 CK 6520 5. 5/19 MRI Lumbar spine  A 1. AoCKD4 1. Looks like related to mild rhabdo 2. Renal US w/o obstruction  3. Recovering! 4. Sees [facility] at [facility]; BL SCr 1.8-2.0 5. Daily weights, Daily Renal Panel, Strict I/Os, Avoid nephrotoxins  (NSAIDs, judicious IV Contrast)  2. Rhabdomyolysis 1. Statin stopped 5/17 2. ? Related to RP hematoma as well 2. RP hematoma while on anticoagulation 3. Cardiac Arrest 5/8: needs LHC and ICD 4. HTN: BP stable on carvediolol 5. Leg pain -- ? Statin related rhabdo, MRI pending 6. Aortic Insufficiency 7. AFib : heparin held 8. Systolic CHF 9. HCAP s/p ABX   Pearson Grippe MD 02/12/2016, 8:39 AM   Recent Labs Lab 02/05/16 1522  02/09/16 0723 02/10/16 0250 02/11/16 0406 02/12/16 0428  NA 143  < > 146* 144 144 145  K 4.9  < > 4.4 4.2 3.9 4.1  CL 112*  < > 108 109 107 105  CO2 20*  < > 22 21* 25 27  GLUCOSE 185*  < > 138* 121* 160* 166*  BUN 52*  < > 81* 98* 116* 100*  CREATININE 2.31*  < > 2.87* 3.15* 3.53* 2.80*  CALCIUM 9.5  < > 9.1 8.6* 8.3* 8.2*  PHOS 3.2  --  4.9* 5.5*  --   --   < > = values in this interval not displayed.  Recent Labs Lab 02/11/16 0406 02/11/16 1213 02/11/16 1930 02/12/16 0038 02/12/16 0428 02/12/16 0713  WBC 26.6* 24.2* 24.3*  --  23.3*  --   NEUTROABS 20.7*  --  19.7*  --  17.9*  --   HGB 7.1* 6.7* 9.1* 8.5* 8.6* 8.6*  HCT 21.9* 21.0* 27.8* 26.3* 26.3* 26.2*  MCV 95.2 95.5 90.6  --  91.0  --   PLT 279 301 294  --  277  --

## 2016-02-12 NOTE — Progress Notes (Signed)
Patient Name: Eric [name] Date of Encounter: 02/12/2016  Principal Problem:   Cardiac arrest [facility]) Active Problems:   Chronic systolic heart failure ([place])   Hyperlipidemia   Left bundle branch block   Obesity (BMI 30-39.9)   Atrial flutter ([place])   Acute respiratory failure ([place])   Encounter for central line placement   Arrhythmia   Altered mental status   HCAP (healthcare-associated pneumonia)   AKI (acute kidney injury) ([place])   Ventricular fibrillation ([facility])   Aortic aneurysm without rupture ([facility])   Aortic insufficiency   Back pain   Leg weakness, bilateral   Paroxysmal atrial fibrillation ([facility])   Pneumonia   Acute lumbar back pain   Non-traumatic rhabdomyolysis   Retroperitoneal bleed   Length of Stay: 11  SUBJECTIVE Voice has improved, per pt's request condom cath was placed.  Back pain stable. Still has some trouble raising his thighs off of the bed likely related to bleeding around iliopsoas muscle.   Tele: NSR/ sinus tachycardia, HR 92   CURRENT MEDS . amiodarone  400 mg Oral BID  . carvedilol  37.5 mg Oral BID WC  . docusate sodium  100 mg Oral BID  . feeding supplement (ENSURE ENLIVE)  237 mL Oral BID BM  . insulin aspart  0-5 Units Subcutaneous QHS  . insulin aspart  0-9 Units Subcutaneous TID WC  . isosorbide-hydrALAZINE  0.5 tablet Oral TID  . levothyroxine  75 mcg Oral QAC breakfast  . polyethylene glycol  17 g Oral Daily    OBJECTIVE   Intake/Output Summary (Last 24 hours) at 02/12/16 0949 Last data filed at 02/12/16 0800  Gross per 24 hour  Intake 878.18 ml  Output   1550 ml  Net -671.82 ml   Filed Weights   02/07/16 0411 02/11/16 0500 02/12/16 0311  Weight: 237 lb 10.5 oz (107.8 kg) 241 lb 13.5 oz (109.7 kg) 249 lb 1.9 oz (113 kg)    PHYSICAL EXAM Filed Vitals:   02/12/16 0600 02/12/16 0735 02/12/16 0800 02/12/16 0900  BP: 125/70 103/80 122/68   Pulse: 88 91 92 92  Temp:  99.3 F (37.4 C)    TempSrc:  Oral    Resp: 9 11 28  22   Height:      Weight:      SpO2: 92% 99% 98% 100%   General: Alert, oriented x3, no distress Head: EOMI, moist mucous membranes Pulm: clear to auscultation, no crackles or rhonchi Cardiovascular: RRR, no murmurs or gallops Abdomen: no tenderness or distention, no masses by palpation, no abnormal pulsatility or arterial bruits, normal bowel sounds, no hepatosplenomegaly Extremities: no clubbing, cyanosis or edema; Neurological: CN 2-12 grossly intact Skin: warm and dry  LABS  CBC  Recent Labs  02/11/16 1930  02/12/16 0428 02/12/16 0713  WBC 24.3*  --  23.3*  --   NEUTROABS 19.7*  --  17.9*  --   HGB 9.1*  < > 8.6* 8.6*  HCT 27.8*  < > 26.3* 26.2*  MCV 90.6  --  91.0  --   PLT 294  --  277  --   < > = values in this interval not displayed. Basic Metabolic Panel  Recent Labs  02/10/16 0250 02/11/16 0406 02/12/16 0428  NA 144 144 145  K 4.2 3.9 4.1  CL 109 107 105  CO2 21* 25 27  GLUCOSE 121* 160* 166*  BUN 98* 116* 100*  CREATININE 3.15* 3.53* 2.80*  CALCIUM 8.6* 8.3* 8.2*  MG 2.8* 3.1*  3.0*  PHOS 5.5*  --   --    Liver Function Tests  Recent Labs  02/10/16 0827  AST 164*  ALT 56  ALKPHOS 42  BILITOT 1.1  PROT 7.6  ALBUMIN 2.7*   No results for input(s): LIPASE, AMYLASE in the last 72 hours. Cardiac Enzymes  Recent Labs  02/11/16 1213 02/11/16 1930 02/12/16 0428  CKTOTAL 7202* 5900* 4722*    Radiology Studies Imaging results have been reviewed, MRI personally reviewed    ASSESSMENT AND PLAN  1. S/P cardiac arrest, presumed VT/VF VF on AED interrogation. Plan for CRT-D on this admission, secondary prevention. Normal electrolytes on admission. Moderate to severely depressed LVEF despite chronic comprehensive medical treatment. - will need cardiac cath as renal function once renal function improved. Creat trending down.  2. Chronic systolic heart failure-TTE 5/17 reveals EF of 99991111, grade 2 diastolic dysfunction. Holding lasix due to  AKI. Appears comfortable laying flat in bed. Continue carvedilol.  3. 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 TEE 02/08/16 revealed moderate aortic regurgitation with eccentric jet. Aortic root 5.2cm.  4. Atrial fibrillation-- now in sinus rhythm. On home coreg 25mg  BID. On IV heparin, home DOAC has been stopped. Holding IV heparin given retroperitoneal hemorrhage. Continue amiodarone po 400mg  BID. Eventual decrease after sufficient load (first dose was Wednesday 02/09/16)  5. Hypertension- controlled, continue coreg, resumed bidil 20-37.5mg  yesterday.   6. Aortic root enlargement - aortic root measures 39mm on TEE -May benefit CT Angio when renal function is clearly stabilized.  7. 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 CK elevated. Likely rhabdo cause of renal failure, nephro following. Thankfully, creatinine is improving now 2.8. Back pain and muscle weakness could be due to rhabo as well, but likely related to retroperitoneal bleed.    8. Left bundle branch block typical LBBB with QRS duration 176ms predicts response to CRT.  9. Hyperlipidemia - stopped statin due to rhabdo  10. Possible aspiration pneumonia- completed course of abx. CXR 5/18 neg for infection.   11. Leukocytosis -- WBC remains elevated. UA negative. Pt has been afebrile, blood cultures repeated 5/18 NGTD, urine culture 5/18 1000 colonies, insignificant growth, blood cutlures from 5/12 NGTD. His portable CXR was neg for PNA. Possible diskitis or spinal source,  lumbar MRI was attempted however upon initial images, retroperitoneal hemorrhage was discovered.   12. Anemia-- hgb 7.1 previously from 9.5 secondary to retroperitoneal bleed, discovered by MRI back.  - Status post 2 units of blood with diuretics  - Hemoglobin 8.6 currently, stable posttransfusion  - Continue to hold heparin IV (was placed in the setting of  cardioversion)   Candee Furbish, MD   02/12/2016 9:49 AM

## 2016-02-12 NOTE — Progress Notes (Addendum)
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   He was in his USOH until evening of 05/08 when he was with his wife at [place]. As they were leaving [employer], he began driving the car and while still in the parking lot, he began to breath very heavily before becoming unresponsive. His wife immediately got the car stopped, got him out of the car and began CPR while calling EMS. She feels that EMS arrived within 5 - 10 minutes and upon their arrival, he received 2 defibrillations via AED. ROSC was achieved within roughly 15 - 20 minutes.  Upon ED arrival, he remained unresponsive and was subsequently intubated. PCCM was called for admission and consideration of hypothermia protocol. Cardiology was also called in consultation.  Per his wife, he had not had any complaints recently and had behaved completely normal. 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   Assessment & Plan:   Principal Problem:   Cardiac arrest [facility]) Active Problems:   Chronic systolic heart failure (HCC)   Hyperlipidemia   Left bundle branch block   Obesity (BMI 30-39.9)   Atrial flutter ([place])   Acute respiratory failure ([place])   Encounter for central line placement   Arrhythmia   Altered mental status   HCAP (healthcare-associated pneumonia)   AKI (acute kidney injury) ([place])   Ventricular fibrillation (HCC)   Aortic aneurysm without rupture [facility])   Aortic insufficiency   Back pain   Leg weakness, bilateral   Paroxysmal atrial fibrillation (HCC)   Pneumonia   Acute lumbar back pain   Non-traumatic rhabdomyolysis   Retroperitoneal bleed  V. fib Cardiac arrest  - CPR performed by wife, multiple  shocks -Hypothermic protocol performed -Stable  Chronic systolic CHF  -(EF [id number] per echo from April 2017) -Strict I and O's since admission + 239ml -Daily weight  Filed Weights   02/07/16 0411 02/11/16 0500 02/12/16 0311  Weight: 107.8 kg (237 lb 10.5 oz) 109.7 kg (241 lb 13.5 oz) 113 kg (249 lb 1.9 oz)  -Coreg 37.5 mg BID -BiDil 20-30 7.5 mg 0.5 tablet TID -Transfuse for hemoglobin<7 -5/19 transfuse 2 units PRBC  Aortic aneurysm -4.9 cm aortic aneurysm -- will need f/u CT of chest once extubated and Cr improved   HTN  -Stable -See CHF  Paroxysmal A.fib  -See CHF -Hold all anticoagulants secondary to acute blood loss anemia  Aspiration Pneumonia positive CITROBACTER KOSERI -Completed 7 day course antibiotics  -Patient with continued leukocytosis, however afebrile. Monitor closely off antibiotics  Acute encephalopathy  -Resolved  New-onset bilateral LE weakness and numbness -MRI L-spine; bilateral retroperitoneal bleed see results below   -Still believe there may be a component of CVA secondary to patient's cardiac arrest. Over the weekend anesthesia refused to fully sedate patient (felt unstable) in order to obtain brain MRI. If no significant improvement by 5/22 MUST INSIST ANESTHESIA perform requested study.   Acute on chronic renal failure (baselineCr~1.9)  Lab Results  Component Value Date   CREATININE 2.80* 02/12/2016   CREATININE 3.53* 02/11/2016   CREATININE 3.15* 02/10/2016   Hyperkalemia -resolved  Rhabdomyolysis -Atorvastatin stopped 5/17 -Patient's CK climbing and after 10 days cannot blame V. fib arrest. 5/19 CK continues to climb. -Repeat CK q  6hr -CK appears to have peaked at 7202 and is now trending down -Sodium Bicarb 75 ml/hr; Held by nephrology, secondary patient obtaining 2 units RBC   Nutrition -Dysphagia 3 nectar thick  Hypothyroidism. -Synthroid 75 g daily  -TSH pending  Acute blood loss anemia/Bilateral Retroperitoneal  Bleed Recent Labs  Recent Labs Lab 02/11/16 1213 02/11/16 1930 02/12/16 0038 02/12/16 0428 02/12/16 0713  HGB 6.7* 9.1* 8.5* 8.6* 8.6*  -5/19 transfuse 2 units PRBC -Hold any anticoagulant -H/H q 4hr -Patient not able to have abdominal/pelvic CT with contrast secondary to renal function -Appears to have stabilized however continue to monitor closely. If H/H begins to rapidly decline consult surgery, exploratory laparoscopy?    DVT prophylaxis: SCD Code Status: Full Family Communication: None available Disposition Plan: Resolution rhabdomyolysis, workup for lower extremity weakness   Consultants:  Stefano Gaul nephrology    Procedures/Significant Events:  05/08 - admitted after cardiac arrest. Started on TTM - 33 degrees C CXR 05/08 >>no acute process. CT head 05/08 >>no acute process Echo 5/9>>EF 30 to 35%, severe aortic regurgitation EEG 5/9>>no focal, hemispheric, or lateralizing features; demonstrated a burst/suppression pattern, which can be consistent with a sedated EEG 5/11>>mod diffuse slowing, No epileptiform activity was recorded. 5/10-pt rewarmed per hypothermia protocol 5/14 >> Extubated Hip spine films 5/15 > no acute abnormality 5/16 TEE;LVEF= 30%- 35%. Diffuse hypokinesis. - Aortic valve: Moderate regurgitation.- Left atrium: Moderately dilated.  5/19 MRI L-spine; Bilateral retroperitoneal hemorrhage involving the psoas musculature. Follow-up CT Abdomen and Pelvis could characterize further.   Cultures Blood 05/08 >>no growth 5/11>> Urine 05/08 >>no growth 5/10 Sputum 05/08 >>few GPC, few GNR>>> 5/12 sputum positive CITROBACTER KOSERI MRSA PCR 5/9>>Negative BCx2 5/12>>> 5/18 blood pending 5/18 urine pending  Antimicrobials: Zosyn 5/11>> 5/16 Vancomycin 5/11>> 5/14 Ceftriaxone 5/16 >> 5/18   Devices    LINES / TUBES:      Continuous Infusions: . sodium chloride 10 mL/hr at 02/11/16 1757     Subjective: 5/20 A/O 4,  NAD.  Objective: Filed Vitals:   02/12/16 0400 02/12/16 0500 02/12/16 0600 02/12/16 0735  BP: 112/70 111/62 125/70 103/80  Pulse: 96 91 88 91  Temp:    99.3 F (37.4 C)  TempSrc:    Oral  Resp: 13 17 9 11   Height:      Weight:      SpO2: 99% 97% 92% 99%    Intake/Output Summary (Last 24 hours) at 02/12/16 0824 Last data filed at 02/12/16 0600  Gross per 24 hour  Intake 638.18 ml  Output   1550 ml  Net -911.82 ml   Filed Weights   02/07/16 0411 02/11/16 0500 02/12/16 0311  Weight: 107.8 kg (237 lb 10.5 oz) 109.7 kg (241 lb 13.5 oz) 113 kg (249 lb 1.9 oz)    Examination:  General: A/O 4, NAD, No acute respiratory distress Eyes: negative scleral hemorrhage, negative anisocoria, negative icterus ENT: Negative Runny nose, negative gingival bleeding, Neck:  Negative scars, masses, torticollis, lymphadenopathy, JVD Lungs: Clear to auscultation bilaterally without wheezes or crackles Cardiovascular: Regular rate and rhythm without murmur gallop or rub normal S1 and S2 Abdomen: negative abdominal pain, nondistended, positive soft, bowel sounds, no rebound, no ascites, no appreciable mass Extremities: No significant cyanosis, clubbing, or edema bilateral lower extremities Skin: Negative rashes, lesions, ulcers Psychiatric:  Negative depression, negative anxiety, negative fatigue, negative mania Musculoskeletal; pain to palpation midline L2-S1, pain palpation left lumbosacral area.  Central nervous system:  Cranial nerves II through XII intact, tongue/uvula midline,  upper extremities muscle strength 5/5, Bilat lower extremity strength 3/5, sensation intact throughout, quick finger touch bilateral within normal limits,  negative dysarthria, negative expressive aphasia, negative receptive aphasia.  .     Data Reviewed: Care during the described time interval was provided by me .  I have reviewed this patient's available data, including medical history, events of note, physical  examination, and all test results as part of my evaluation. I have personally reviewed and interpreted all radiology studies.  CBC:  Recent Labs Lab 02/10/16 1200 02/11/16 0406 02/11/16 1213 02/11/16 1930 02/12/16 0038 02/12/16 0428 02/12/16 0713  WBC 20.9* 26.6* 24.2* 24.3*  --  23.3*  --   NEUTROABS  --  20.7*  --  19.7*  --  17.9*  --   HGB 9.5* 7.1* 6.7* 9.1* 8.5* 8.6* 8.6*  HCT 29.4* 21.9* 21.0* 27.8* 26.3* 26.3* 26.2*  MCV 94.2 95.2 95.5 90.6  --  91.0  --   PLT 274 279 301 294  --  277  --    Basic Metabolic Panel:  Recent Labs Lab 02/05/16 1522  02/08/16 0418 02/09/16 0723 02/10/16 0250 02/11/16 0406 02/12/16 0428  NA 143  < > 144 146* 144 144 145  K 4.9  < > 3.9 4.4 4.2 3.9 4.1  CL 112*  < > 107 108 109 107 105  CO2 20*  < > 25 22 21* 25 27  GLUCOSE 185*  < > 147* 138* 121* 160* 166*  BUN 52*  < > 58* 81* 98* 116* 100*  CREATININE 2.31*  < > 2.34* 2.87* 3.15* 3.53* 2.80*  CALCIUM 9.5  < > 9.2 9.1 8.6* 8.3* 8.2*  MG 2.4  --   --  2.9* 2.8* 3.1* 3.0*  PHOS 3.2  --   --  4.9* 5.5*  --   --   < > = values in this interval not displayed. GFR: Estimated Creatinine Clearance: 33 mL/min (by C-G formula based on Cr of 2.8). Liver Function Tests:  Recent Labs Lab 02/10/16 0827  AST 164*  ALT 56  ALKPHOS 42  BILITOT 1.1  PROT 7.6  ALBUMIN 2.7*   No results for input(s): LIPASE, AMYLASE in the last 168 hours. No results for input(s): AMMONIA in the last 168 hours. Coagulation Profile:  Recent Labs Lab 02/11/16 1326  INR 1.42   Cardiac Enzymes:  Recent Labs Lab 02/05/16 1522  02/10/16 2336 02/11/16 0406 02/11/16 1213 02/11/16 1930 02/12/16 0428  CKTOTAL  --   < > 5860* 6520* 7202* 5900* 4722*  TROPONINI 0.08*  --   --   --   --   --   --   < > = values in this interval not displayed. BNP (last 3 results) No results for input(s): PROBNP in the last 8760 hours. HbA1C:  Recent Labs  02/10/16 1547  HGBA1C 7.1*   CBG:  Recent Labs Lab  02/11/16 0752 02/11/16 1146 02/11/16 1716 02/11/16 2134 02/12/16 0733  GLUCAP 169* 203* 181* 179* 200*   Lipid Profile: No results for input(s): CHOL, HDL, LDLCALC, TRIG, CHOLHDL, LDLDIRECT in the last 72 hours. Thyroid Function Tests:  Recent Labs  02/11/16 0406  TSH 2.861   Anemia Panel: No results for input(s): VITAMINB12, FOLATE, FERRITIN, TIBC, IRON, RETICCTPCT in the last 72 hours. Urine analysis:    Component Value Date/Time   COLORURINE YELLOW 02/10/2016 1802   APPEARANCEUR CLEAR 02/10/2016 1802   LABSPEC 1.021 02/10/2016 1802   PHURINE 5.0 02/10/2016 1802  GLUCOSEU NEGATIVE 02/10/2016 1802   HGBUR NEGATIVE 02/10/2016 1802   BILIRUBINUR NEGATIVE 02/10/2016 1802   KETONESUR NEGATIVE 02/10/2016 1802   PROTEINUR NEGATIVE 02/10/2016 1802   NITRITE NEGATIVE 02/10/2016 1802   LEUKOCYTESUR NEGATIVE 02/10/2016 1802   Sepsis Labs: @LABRCNTIP (procalcitonin:4,lacticidven:4)  ) Recent Results (from the past 240 hour(s))  Culture, Urine     Status: None   Collection Time: 02/04/16 12:11 PM  Result Value Ref Range Status   Specimen Description URINE, CATHETERIZED  Final   Special Requests Normal  Final   Culture NO GROWTH  Final   Report Status 02/05/2016 FINAL  Final  Culture, respiratory (NON-Expectorated)     Status: None   Collection Time: 02/04/16 12:16 PM  Result Value Ref Range Status   Specimen Description TRACHEAL ASPIRATE  Final   Special Requests Normal  Final   Gram Stain   Final    ABUNDANT WBC PRESENT,BOTH PMN AND MONONUCLEAR FEW SQUAMOUS EPITHELIAL CELLS PRESENT FEW GRAM POSITIVE COCCI IN PAIRS IN CLUSTERS FEW GRAM NEGATIVE RODS Performed at [facility]    Culture   Final    MODERATE CITROBACTER KOSERI Performed at [facility]    Report Status 02/07/2016 FINAL  Final   Organism ID, Bacteria CITROBACTER KOSERI  Final      Susceptibility   Citrobacter koseri - MIC*    CEFEPIME <=1 SENSITIVE Sensitive     CEFTAZIDIME <=1  SENSITIVE Sensitive     CEFTRIAXONE <=1 SENSITIVE Sensitive     CIPROFLOXACIN <=0.25 SENSITIVE Sensitive     GENTAMICIN <=1 SENSITIVE Sensitive     IMIPENEM <=0.25 SENSITIVE Sensitive     PIP/TAZO <=4 SENSITIVE Sensitive     TOBRAMYCIN <=1 SENSITIVE Sensitive     TRIMETH/SULFA Value in next row Sensitive      <=20 SENSITIVE(NOTE)    * MODERATE CITROBACTER KOSERI  Culture, blood (routine x 2)     Status: None   Collection Time: 02/04/16  1:50 PM  Result Value Ref Range Status   Specimen Description BLOOD LEFT HAND  Final   Special Requests IN PEDIATRIC BOTTLE 2CC  Final   Culture NO GROWTH 5 DAYS  Final   Report Status 02/09/2016 FINAL  Final  Culture, blood (routine x 2)     Status: None   Collection Time: 02/04/16  2:08 PM  Result Value Ref Range Status   Specimen Description BLOOD RIGHT HAND  Final   Special Requests IN PEDIATRIC BOTTLE .5CC  Final   Culture NO GROWTH 5 DAYS  Final   Report Status 02/09/2016 FINAL  Final  Culture, blood (Routine X 2) w Reflex to ID Panel     Status: None (Preliminary result)   Collection Time: 02/10/16  8:40 AM  Result Value Ref Range Status   Specimen Description BLOOD RIGHT HAND  Final   Special Requests BOTTLES DRAWN AEROBIC ONLY 4CC  Final   Culture NO GROWTH 1 DAY  Final   Report Status PENDING  Incomplete  Culture, blood (Routine X 2) w Reflex to ID Panel     Status: None (Preliminary result)   Collection Time: 02/10/16  9:00 AM  Result Value Ref Range Status   Specimen Description BLOOD LEFT HAND  Final   Special Requests IN PEDIATRIC BOTTLE 1.5CC  Final   Culture NO GROWTH 1 DAY  Final   Report Status PENDING  Incomplete         Radiology Studies: Mr Lumbar Spine Wo Contrast  02/11/2016  CLINICAL DATA:  65 year old male status post collapse and cardiac arrest. Abrupt drop in hemoglobin from 9 to and 7 with associated new severe low back pain radiating to the hips and down both lower extremities. Initial encounter. EXAM: MRI  LUMBAR SPINE WITHOUT CONTRAST - LIMITED TECHNIQUE: Multiplanar, multisequence MR imaging of the lumbar spine was performed. No intravenous contrast was administered. COMPARISON:  Lumbar radiographs 02/07/2016 FINDINGS: I was contacted during the initial stages of attempted lumbar MRI by the technologist. This study was being attempted with the help of Anesthesia. The patient was to unstable for general anesthesia, and was not tolerating MRI well. The scout images demonstrate bilateral psoas muscle retroperitoneal hemorrhage (series 2, image 5 and image 20). No definite acute findings in the visible lumbar spine which is primarily seen sagittally on series 2, image 12. At 1024 hours I discussed the above with the Anesthesiologist. We agreed that the patient would be better served with CT Abdomen and Pelvis and decision was made to discontinue additional MRI imaging. IMPRESSION: Bilateral retroperitoneal hemorrhage involving the psoas musculature. Follow-up CT Abdomen and Pelvis could characterize further. Lumbar MRI discontinued prior to completion as described above. This was discussed by telephone with Anesthesia at 1024 hours, and again with Dr. Dani Gobble Croitoru On 02/11/2016 at 1315 hours. Electronically Signed   By: Genevie Ann M.D.   On: 02/11/2016 13:27   Dg Chest Port 1 View  02/10/2016  CLINICAL DATA:  Shortness of breath and leukocytosis. EXAM: PORTABLE CHEST 1 VIEW COMPARISON:  02/07/2016 FINDINGS: Lungs are adequately inflated without consolidation or effusion. Mild stable cardiomegaly. Minimal degenerative change of the spine. IMPRESSION: No acute cardiopulmonary disease. Mild stable cardiomegaly. Electronically Signed   By: Marin Olp M.D.   On: 02/10/2016 09:54        Scheduled Meds: . amiodarone  400 mg Oral BID  . carvedilol  37.5 mg Oral BID WC  . docusate sodium  100 mg Oral BID  . feeding supplement (ENSURE ENLIVE)  237 mL Oral BID BM  . insulin aspart  0-5 Units Subcutaneous QHS  .  insulin aspart  0-9 Units Subcutaneous TID WC  . isosorbide-hydrALAZINE  0.5 tablet Oral TID  . levothyroxine  75 mcg Oral QAC breakfast  . polyethylene glycol  17 g Oral Daily   Continuous Infusions: . sodium chloride 10 mL/hr at 02/11/16 1757     LOS: 11 days    Time spent: 40 minutes    Maurisa Tesmer, Geraldo Docker, MD [facility] Pager [phone]   If 7PM-7AM, please contact night-coverage [email or web address] Password [facility] 02/12/2016, 8:24 AM

## 2016-02-12 NOTE — Progress Notes (Signed)
CVP renewal not needed for this PT.

## 2016-02-13 ENCOUNTER — Inpatient Hospital Stay (HOSPITAL_COMMUNITY): Payer: BC Managed Care – PPO

## 2016-02-13 DIAGNOSIS — I712 Thoracic aortic aneurysm, without rupture: Secondary | ICD-10-CM | POA: Diagnosis not present

## 2016-02-13 DIAGNOSIS — R29898 Other symptoms and signs involving the musculoskeletal system: Secondary | ICD-10-CM | POA: Diagnosis present

## 2016-02-13 DIAGNOSIS — N179 Acute kidney failure, unspecified: Secondary | ICD-10-CM | POA: Diagnosis not present

## 2016-02-13 DIAGNOSIS — J9601 Acute respiratory failure with hypoxia: Secondary | ICD-10-CM | POA: Diagnosis not present

## 2016-02-13 DIAGNOSIS — M545 Low back pain: Secondary | ICD-10-CM | POA: Diagnosis not present

## 2016-02-13 DIAGNOSIS — I469 Cardiac arrest, cause unspecified: Secondary | ICD-10-CM | POA: Diagnosis not present

## 2016-02-13 LAB — GLUCOSE, CAPILLARY
GLUCOSE-CAPILLARY: 215 mg/dL — AB (ref 65–99)
GLUCOSE-CAPILLARY: 176 mg/dL — AB (ref 65–99)
GLUCOSE-CAPILLARY: 207 mg/dL — AB (ref 65–99)
Glucose-Capillary: 140 mg/dL — ABNORMAL HIGH (ref 65–99)

## 2016-02-13 LAB — CBC WITH DIFFERENTIAL/PLATELET
BASOS PCT: 0 %
Basophils Absolute: 0 10*3/uL (ref 0.0–0.1)
EOS ABS: 0.2 10*3/uL (ref 0.0–0.7)
Eosinophils Relative: 1 %
HEMATOCRIT: 27.4 % — AB (ref 39.0–52.0)
HEMOGLOBIN: 8.6 g/dL — AB (ref 13.0–17.0)
LYMPHS ABS: 2.4 10*3/uL (ref 0.7–4.0)
Lymphocytes Relative: 11 %
MCH: 29.6 pg (ref 26.0–34.0)
MCHC: 31.4 g/dL (ref 30.0–36.0)
MCV: 94.2 fL (ref 78.0–100.0)
MONO ABS: 2.2 10*3/uL — AB (ref 0.1–1.0)
Monocytes Relative: 10 %
NEUTROS ABS: 17.3 10*3/uL — AB (ref 1.7–7.7)
NEUTROS PCT: 78 %
Platelets: 262 10*3/uL (ref 150–400)
RBC: 2.91 MIL/uL — ABNORMAL LOW (ref 4.22–5.81)
RDW: 16 % — AB (ref 11.5–15.5)
WBC: 22.1 10*3/uL — ABNORMAL HIGH (ref 4.0–10.5)

## 2016-02-13 LAB — BASIC METABOLIC PANEL
Anion gap: 9 (ref 5–15)
BUN: 72 mg/dL — AB (ref 6–20)
CHLORIDE: 107 mmol/L (ref 101–111)
CO2: 27 mmol/L (ref 22–32)
CREATININE: 2.22 mg/dL — AB (ref 0.61–1.24)
Calcium: 8.3 mg/dL — ABNORMAL LOW (ref 8.9–10.3)
GFR calc Af Amer: 34 mL/min — ABNORMAL LOW (ref >60)
GFR calc non Af Amer: 30 mL/min — ABNORMAL LOW (ref >60)
Glucose, Bld: 179 mg/dL — ABNORMAL HIGH (ref 65–99)
POTASSIUM: 4 mmol/L (ref 3.5–5.1)
Sodium: 143 mmol/L (ref 135–145)

## 2016-02-13 LAB — MAGNESIUM: Magnesium: 3.2 mg/dL — ABNORMAL HIGH (ref 1.7–2.4)

## 2016-02-13 LAB — CK: Total CK: 3442 U/L — ABNORMAL HIGH (ref 49–397)

## 2016-02-13 LAB — HEMOGLOBIN AND HEMATOCRIT, BLOOD
HCT: 27.5 % — ABNORMAL LOW (ref 39.0–52.0)
HEMATOCRIT: 28 % — AB (ref 39.0–52.0)
HEMOGLOBIN: 8.8 g/dL — AB (ref 13.0–17.0)
Hemoglobin: 8.7 g/dL — ABNORMAL LOW (ref 13.0–17.0)

## 2016-02-13 MED ORDER — IOPAMIDOL (ISOVUE-370) INJECTION 76%
INTRAVENOUS | Status: AC
  Filled 2016-02-13: qty 50

## 2016-02-13 MED ORDER — MIDAZOLAM HCL 2 MG/2ML IJ SOLN
2.0000 mg | Freq: Once | INTRAMUSCULAR | Status: AC
  Administered 2016-02-13: 2 mg via INTRAVENOUS
  Filled 2016-02-13: qty 4

## 2016-02-13 NOTE — Progress Notes (Signed)
Patient Name: Eric [name] Date of Encounter: 02/13/2016  Principal Problem:   Cardiac arrest [facility]) Active Problems:   Chronic systolic heart failure ([place])   Hyperlipidemia   Left bundle branch block   Obesity (BMI 30-39.9)   Atrial flutter ([place])   Acute respiratory failure ([place])   Encounter for central line placement   Arrhythmia   Altered mental status   HCAP (healthcare-associated pneumonia)   AKI (acute kidney injury) ([place])   Ventricular fibrillation (HCC)   Aortic aneurysm without rupture (HCC)   Aortic insufficiency   Back pain   Leg weakness, bilateral   Paroxysmal atrial fibrillation (HCC)   Pneumonia   Acute lumbar back pain   Non-traumatic rhabdomyolysis   Retroperitoneal bleed   Weakness of both lower extremities   Length of Stay: 12  SUBJECTIVE Voice has improved.  Back pain stable. Still has trouble raising his thighs off of the bed ? related to rhabdo/ bleeding around iliopsoas muscle. Wife in room. No CP.    Tele: NSR/ sinus tachycardia, HR 92   CURRENT MEDS . amiodarone  400 mg Oral BID  . carvedilol  37.5 mg Oral BID WC  . docusate sodium  100 mg Oral BID  . feeding supplement (ENSURE ENLIVE)  237 mL Oral BID BM  . insulin aspart  0-5 Units Subcutaneous QHS  . insulin aspart  0-9 Units Subcutaneous TID WC  . isosorbide-hydrALAZINE  0.5 tablet Oral TID  . levothyroxine  75 mcg Oral QAC breakfast  . polyethylene glycol  17 g Oral Daily    OBJECTIVE   Intake/Output Summary (Last 24 hours) at 02/13/16 1010 Last data filed at 02/13/16 0900  Gross per 24 hour  Intake   1200 ml  Output   1050 ml  Net    150 ml   Filed Weights   02/11/16 0500 02/12/16 0311 02/13/16 0427  Weight: 241 lb 13.5 oz (109.7 kg) 249 lb 1.9 oz (113 kg) 250 lb (113.399 kg)    PHYSICAL EXAM Filed Vitals:   02/13/16 0759 02/13/16 0800 02/13/16 0950 02/13/16 1000  BP: 141/81 141/81 125/57 131/65  Pulse: 94 93 89 79  Temp: 98.5 F (36.9 C)     TempSrc: Oral      Resp: 14 18 17 21   Height:      Weight:      SpO2: 96% 92% 99% 98%   General: Alert, oriented x3, no distress Head: EOMI, moist mucous membranes Pulm: clear to auscultation, no crackles or rhonchi Cardiovascular: RRR, no murmurs or gallops Abdomen: no tenderness or distention, no masses by palpation, no abnormal pulsatility or arterial bruits, normal bowel sounds, no hepatosplenomegaly Extremities: no clubbing, cyanosis or edema; Neurological: CN 2-12 grossly intact Skin: warm and dry  LABS  CBC  Recent Labs  02/12/16 0428  02/13/16 0322 02/13/16 0653  WBC 23.3*  --  22.1*  --   NEUTROABS 17.9*  --  17.3*  --   HGB 8.6*  < > 8.6* 8.7*  HCT 26.3*  < > 27.4* 27.5*  MCV 91.0  --  94.2  --   PLT 277  --  262  --   < > = values in this interval not displayed. Basic Metabolic Panel  Recent Labs  02/12/16 0428 02/13/16 0322  NA 145 143  K 4.1 4.0  CL 105 107  CO2 27 27  GLUCOSE 166* 179*  BUN 100* 72*  CREATININE 2.80* 2.22*  CALCIUM 8.2* 8.3*  MG 3.0* 3.2*  Liver Function Tests No results for input(s): AST, ALT, ALKPHOS, BILITOT, PROT, ALBUMIN in the last 72 hours. No results for input(s): LIPASE, AMYLASE in the last 72 hours. Cardiac Enzymes  Recent Labs  02/12/16 1557 02/12/16 2228 02/13/16 0322  CKTOTAL 3956* 3419* 3442*    Radiology Studies Imaging results have been reviewed, MRI personally reviewed    ASSESSMENT AND PLAN  1. S/P cardiac arrest, presumed VT/VF VF on AED interrogation. Plan for CRT-D on this admission, secondary prevention. Normal electrolytes on admission. Moderate to severely depressed LVEF despite chronic comprehensive medical treatment. - will need cardiac cath as renal function once renal function improved. Creat trending down.  2. Chronic systolic heart failure-TTE 5/17 reveals EF of 99991111, grade 2 diastolic dysfunction. Holding lasix due to AKI. Appears comfortable laying flat in bed. Continue carvedilol.  3. 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 TEE 02/08/16 revealed moderate aortic regurgitation with eccentric jet. Aortic root 5.2cm.  4. Atrial fibrillation-- now in sinus rhythm. On home coreg 25mg  BID. On IV heparin, home DOAC has been stopped. Holding IV heparin given retroperitoneal hemorrhage. Continue amiodarone po 400mg  BID. Eventual decrease after sufficient load (first dose was Wednesday 02/09/16). Consider decrease amio tomorrow.  5. Hypertension- controlled, continue coreg, resumed bidil 20-37.5mg  yesterday.   6. Aortic root enlargement - aortic root measures 68mm on TEE -May benefit CT Angio when renal function is clearly stabilized.  7. 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 CK elevated. Likely rhabdo cause of renal failure, nephro following. Thankfully, creatinine is improving now 2.8. Back pain and muscle weakness could be due to rhabo as well, ? related to retroperitoneal bleed causing irritation around psoas muscles. Will need PT.     8. Left bundle branch block typical LBBB with QRS duration 178ms predicts response to CRT.  9. Hyperlipidemia - stopped statin due to rhabdo  10. Possible aspiration pneumonia- completed course of abx. CXR 5/18 neg for infection.   11. Leukocytosis -- WBC remains elevated but mildly improved (22.1). UA negative. Pt has been afebrile, blood cultures repeated 5/18 NGTD, urine culture 5/18 1000 colonies, insignificant growth, blood cutlures from 5/12 NGTD. His portable CXR was neg for PNA. Possible diskitis or spinal source,  lumbar MRI was attempted however upon initial images, retroperitoneal hemorrhage was discovered.   12. Anemia-- hgb 7.1 previously from 9.5 secondary to retroperitoneal bleed, discovered by MRI back.  - Status post 2 units of blood with diuretics  - Hemoglobin 8.7 currently, stable posttransfusion  - Continue to hold heparin IV  (was placed in the setting of cardioversion)   Candee Furbish, MD   02/13/2016 10:10 AM

## 2016-02-13 NOTE — Progress Notes (Addendum)
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   He was in his USOH until evening of 05/08 when he was with his wife at [place]. As they were leaving [employer], he began driving the car and while still in the parking lot, he began to breath very heavily before becoming unresponsive. His wife immediately got the car stopped, got him out of the car and began CPR while calling EMS. She feels that EMS arrived within 5 - 10 minutes and upon their arrival, he received 2 defibrillations via AED. ROSC was achieved within roughly 15 - 20 minutes.  Upon ED arrival, he remained unresponsive and was subsequently intubated. PCCM was called for admission and consideration of hypothermia protocol. Cardiology was also called in consultation.  Per his wife, he had not had any complaints recently and had behaved completely normal. 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   Assessment & Plan:   Principal Problem:   Cardiac arrest [facility]) Active Problems:   Chronic systolic heart failure (HCC)   Hyperlipidemia   Left bundle branch block   Obesity (BMI 30-39.9)   Atrial flutter ([place])   Acute respiratory failure ([place])   Encounter for central line placement   Arrhythmia   Altered mental status   HCAP (healthcare-associated pneumonia)   AKI (acute kidney injury) ([place])   Ventricular fibrillation (HCC)   Aortic aneurysm without rupture [facility])   Aortic insufficiency   Back pain   Leg weakness, bilateral   Paroxysmal atrial fibrillation (HCC)   Pneumonia   Acute lumbar back pain   Non-traumatic rhabdomyolysis   Retroperitoneal bleed   Weakness of both lower extremities   Lower extremity weakness  V. fib  Cardiac arrest  - CPR performed by wife, multiple shocks -Hypothermic protocol performed -Stable  Chronic systolic CHF  -(EF [id number] per echo from April 2017) -Strict I and O's since admission + 270ml -Daily weight  Filed Weights   02/11/16 0500 02/12/16 0311 02/13/16 0427  Weight: 109.7 kg (241 lb 13.5 oz) 113 kg (249 lb 1.9 oz) 113.399 kg (250 lb)  -Coreg 37.5 mg BID -BiDil 20-30 7.5 mg 0.5 tablet TID -Transfuse for hemoglobin<7 -5/19 transfuse 2 units PRBC  Aortic aneurysm -4.9 cm aortic aneurysm -- will need f/u CT of chest once extubated and Cr improved   HTN  -Stable -See CHF  Paroxysmal A.fib  -See CHF -Hold all anticoagulants secondary to acute blood loss anemia  Aspiration Pneumonia positive CITROBACTER KOSERI -Completed 7 day course antibiotics  -Patient with continued leukocytosis, however afebrile. Monitor closely off antibiotics  Acute encephalopathy  -Resolved  New-onset bilateral LE weakness and numbness -MRI L-spine; bilateral retroperitoneal bleed see results below   -Still believe there may be a component of CVA secondary to patient's cardiac arrest. Over the weekend anesthesia refused to fully sedate patient (felt unstable) in order to obtain brain MRI. If no significant improvement by 5/22 MUST INSIST ANESTHESIA perform requested study.  -Patient continues to have SSx although on physical exam pain in L-spine region is almost fully resolved. If retroperitoneal bleed cause (pressure on spine/lumbar nerve plexus) would be expecting SSx to be improving. -[facility]; who requested I obtain CT head, CT C-spine, CT T-spine, CT pelvis  and abdomen all without contrast. Versed ordered for prior to leaving for CT scan. If additional medication required would add morphine 2-4 mg. RN must accompany patient to CT scanner.  Acute on chronic renal failure (baselineCr~1.9)  Lab Results  Component Value Date   CREATININE 2.22* 02/13/2016   CREATININE  2.80* 02/12/2016   CREATININE 3.53* 02/11/2016   Hyperkalemia -resolved  Rhabdomyolysis -Atorvastatin stopped 5/17 -Patient's CK climbing and after 10 days cannot blame V. fib arrest. 5/19 CK continues to climb. -Repeat CK q 6hr -CK appears to have peaked at 7202 and is now trending down -Sodium Bicarb 75 ml/hr; Held by nephrology, secondary patient obtaining 2 units RBC   Nutrition -Dysphagia 3 nectar thick  Hypothyroidism. -Synthroid 75 g daily  -TSH pending  Acute blood loss anemia/Bilateral Retroperitoneal Bleed Recent Labs  Recent Labs Lab 02/12/16 1918 02/12/16 2332 02/13/16 0322 02/13/16 0653 02/13/16 1200  HGB 8.7* 8.5* 8.6* 8.7* 8.8*  -5/19 transfuse 2 units PRBC -Hold any anticoagulant -Patient not able to have abdominal/pelvic CT with contrast secondary to renal function -H/H stable continue to monitor closely      DVT prophylaxis: SCD Code Status: Full Family Communication: None available Disposition Plan: Resolution rhabdomyolysis, workup for lower extremity weakness   Consultants:  Stefano Gaul nephrology Dr.Nandigam neurology   Procedures/Significant Events:  05/08 - admitted after cardiac arrest. Started on TTM - 33 degrees C CXR 05/08 >>no acute process. CT head 05/08 >>no acute process Echo 5/9>>EF 30 to 35%, severe aortic regurgitation EEG 5/9>>no focal, hemispheric, or lateralizing features; demonstrated a burst/suppression pattern, which can be consistent with a sedated EEG 5/11>>mod diffuse slowing, No epileptiform activity was recorded. 5/10-pt rewarmed per hypothermia protocol 5/14 >> Extubated Hip spine films 5/15 > no acute abnormality 5/16 TEE;LVEF= 30%- 35%. Diffuse hypokinesis. - Aortic valve: Moderate regurgitation.- Left atrium: Moderately dilated.  5/19 MRI L-spine; Bilateral retroperitoneal hemorrhage involving the psoas musculature. Follow-up CT Abdomen and Pelvis could characterize further.   Cultures Blood  05/08 >>no growth 5/11>> Urine 05/08 >>no growth 5/10 Sputum 05/08 >>few GPC, few GNR>>> 5/12 sputum positive CITROBACTER KOSERI MRSA PCR 5/9>>Negative BCx2 5/12>>> 5/18 blood pending 5/18 urine pending  Antimicrobials: Zosyn 5/11>> 5/16 Vancomycin 5/11>> 5/14 Ceftriaxone 5/16 >> 5/18   Devices    LINES / TUBES:      Continuous Infusions: . sodium chloride 10 mL/hr at 02/11/16 1757     Subjective: 5/20 A/O 4, NAD. Patient still reports bilateral lower extremity weakness/paresthesia anterior thighs   Objective: Filed Vitals:   02/13/16 0950 02/13/16 1000 02/13/16 1122 02/13/16 1200  BP: 125/57 131/65 116/61 127/73  Pulse: 89 79 82 99  Temp:   99.7 F (37.6 C)   TempSrc:   Oral   Resp: 17 21 26 20   Height:      Weight:      SpO2: 99% 98% 99% 100%    Intake/Output Summary (Last 24 hours) at 02/13/16 1501 Last data filed at 02/13/16 1300  Gross per 24 hour  Intake   1497 ml  Output   1325 ml  Net    172 ml   Filed Weights   02/11/16 0500 02/12/16 0311 02/13/16 0427  Weight: 109.7 kg (241 lb 13.5 oz) 113 kg (249 lb 1.9 oz) 113.399 kg (250 lb)    Examination:  General: A/O 4, NAD, No acute respiratory distress Eyes: negative scleral hemorrhage, negative anisocoria, negative icterus ENT: Negative Runny nose, negative gingival bleeding, Neck:  Negative scars, masses, torticollis, lymphadenopathy, JVD  Lungs: Clear to auscultation bilaterally without wheezes or crackles Cardiovascular: Regular rate and rhythm without murmur gallop or rub normal S1 and S2 Abdomen: negative abdominal pain, nondistended, positive soft, bowel sounds, no rebound, no ascites, no appreciable mass Extremities: No significant cyanosis, clubbing, or edema bilateral lower extremities Skin: Negative rashes, lesions, ulcers Psychiatric:  Negative depression, negative anxiety, negative fatigue, negative mania Musculoskeletal; pain to palpation midline L2-S1, pain palpation left  lumbosacral area.  Central nervous system:  Cranial nerves II through XII intact, tongue/uvula midline, upper extremities muscle strength 5/5, Bilat lower extremity strength 3/5, sensation Decreased bilateral anterior thighs region,  can now tolerate passive elevation of bilateral legs, negative dysarthria, negative expressive aphasia, negative receptive aphasia.  .     Data Reviewed: Care during the described time interval was provided by me .  I have reviewed this patient's available data, including medical history, events of note, physical examination, and all test results as part of my evaluation. I have personally reviewed and interpreted all radiology studies.  CBC:  Recent Labs Lab 02/11/16 0406 02/11/16 1213 02/11/16 1930  02/12/16 0428  02/12/16 1918 02/12/16 2332 02/13/16 0322 02/13/16 0653 02/13/16 1200  WBC 26.6* 24.2* 24.3*  --  23.3*  --   --   --  22.1*  --   --   NEUTROABS 20.7*  --  19.7*  --  17.9*  --   --   --  17.3*  --   --   HGB 7.1* 6.7* 9.1*  < > 8.6*  < > 8.7* 8.5* 8.6* 8.7* 8.8*  HCT 21.9* 21.0* 27.8*  < > 26.3*  < > 27.3* 26.8* 27.4* 27.5* 28.0*  MCV 95.2 95.5 90.6  --  91.0  --   --   --  94.2  --   --   PLT 279 301 294  --  277  --   --   --  262  --   --   < > = values in this interval not displayed. Basic Metabolic Panel:  Recent Labs Lab 02/09/16 0723 02/10/16 0250 02/11/16 0406 02/12/16 0428 02/13/16 0322  NA 146* 144 144 145 143  K 4.4 4.2 3.9 4.1 4.0  CL 108 109 107 105 107  CO2 22 21* 25 27 27   GLUCOSE 138* 121* 160* 166* 179*  BUN 81* 98* 116* 100* 72*  CREATININE 2.87* 3.15* 3.53* 2.80* 2.22*  CALCIUM 9.1 8.6* 8.3* 8.2* 8.3*  MG 2.9* 2.8* 3.1* 3.0* 3.2*  PHOS 4.9* 5.5*  --   --   --    GFR: Estimated Creatinine Clearance: 41.7 mL/min (by C-G formula based on Cr of 2.22). Liver Function Tests:  Recent Labs Lab 02/10/16 0827  AST 164*  ALT 56  ALKPHOS 42  BILITOT 1.1  PROT 7.6  ALBUMIN 2.7*   No results for input(s):  LIPASE, AMYLASE in the last 168 hours. No results for input(s): AMMONIA in the last 168 hours. Coagulation Profile:  Recent Labs Lab 02/11/16 1326  INR 1.42   Cardiac Enzymes:  Recent Labs Lab 02/12/16 0428 02/12/16 1029 02/12/16 1557 02/12/16 2228 02/13/16 0322  CKTOTAL 4722* 4909* 3956* 3419* 3442*   BNP (last 3 results) No results for input(s): PROBNP in the last 8760 hours. HbA1C:  Recent Labs  02/10/16 1547  HGBA1C 7.1*   CBG:  Recent Labs Lab 02/12/16 1120 02/12/16 1615 02/12/16 2123 02/13/16 0802 02/13/16 1126  GLUCAP 196* 210* 206* 140* 215*   Lipid Profile: No results for  input(s): CHOL, HDL, LDLCALC, TRIG, CHOLHDL, LDLDIRECT in the last 72 hours. Thyroid Function Tests:  Recent Labs  02/11/16 0406  TSH 2.861   Anemia Panel: No results for input(s): VITAMINB12, FOLATE, FERRITIN, TIBC, IRON, RETICCTPCT in the last 72 hours. Urine analysis:    Component Value Date/Time   COLORURINE YELLOW 02/10/2016 1802   APPEARANCEUR CLEAR 02/10/2016 1802   LABSPEC 1.021 02/10/2016 1802   PHURINE 5.0 02/10/2016 1802   GLUCOSEU NEGATIVE 02/10/2016 1802   HGBUR NEGATIVE 02/10/2016 1802   BILIRUBINUR NEGATIVE 02/10/2016 1802   KETONESUR NEGATIVE 02/10/2016 1802   PROTEINUR NEGATIVE 02/10/2016 1802   NITRITE NEGATIVE 02/10/2016 1802   LEUKOCYTESUR NEGATIVE 02/10/2016 1802   Sepsis Labs: @LABRCNTIP (procalcitonin:4,lacticidven:4)  ) Recent Results (from the past 240 hour(s))  Culture, Urine     Status: None   Collection Time: 02/04/16 12:11 PM  Result Value Ref Range Status   Specimen Description URINE, CATHETERIZED  Final   Special Requests Normal  Final   Culture NO GROWTH  Final   Report Status 02/05/2016 FINAL  Final  Culture, respiratory (NON-Expectorated)     Status: None   Collection Time: 02/04/16 12:16 PM  Result Value Ref Range Status   Specimen Description TRACHEAL ASPIRATE  Final   Special Requests Normal  Final   Gram Stain   Final     ABUNDANT WBC PRESENT,BOTH PMN AND MONONUCLEAR FEW SQUAMOUS EPITHELIAL CELLS PRESENT FEW GRAM POSITIVE COCCI IN PAIRS IN CLUSTERS FEW GRAM NEGATIVE RODS Performed at [facility]    Culture   Final    MODERATE CITROBACTER KOSERI Performed at [facility]    Report Status 02/07/2016 FINAL  Final   Organism ID, Bacteria CITROBACTER KOSERI  Final      Susceptibility   Citrobacter koseri - MIC*    CEFEPIME <=1 SENSITIVE Sensitive     CEFTAZIDIME <=1 SENSITIVE Sensitive     CEFTRIAXONE <=1 SENSITIVE Sensitive     CIPROFLOXACIN <=0.25 SENSITIVE Sensitive     GENTAMICIN <=1 SENSITIVE Sensitive     IMIPENEM <=0.25 SENSITIVE Sensitive     PIP/TAZO <=4 SENSITIVE Sensitive     TOBRAMYCIN <=1 SENSITIVE Sensitive     TRIMETH/SULFA Value in next row Sensitive      <=20 SENSITIVE(NOTE)    * MODERATE CITROBACTER KOSERI  Culture, blood (routine x 2)     Status: None   Collection Time: 02/04/16  1:50 PM  Result Value Ref Range Status   Specimen Description BLOOD LEFT HAND  Final   Special Requests IN PEDIATRIC BOTTLE 2CC  Final   Culture NO GROWTH 5 DAYS  Final   Report Status 02/09/2016 FINAL  Final  Culture, blood (routine x 2)     Status: None   Collection Time: 02/04/16  2:08 PM  Result Value Ref Range Status   Specimen Description BLOOD RIGHT HAND  Final   Special Requests IN PEDIATRIC BOTTLE .5CC  Final   Culture NO GROWTH 5 DAYS  Final   Report Status 02/09/2016 FINAL  Final  Culture, blood (Routine X 2) w Reflex to ID Panel     Status: None (Preliminary result)   Collection Time: 02/10/16  8:40 AM  Result Value Ref Range Status   Specimen Description BLOOD RIGHT HAND  Final   Special Requests BOTTLES DRAWN AEROBIC ONLY 4CC  Final   Culture NO GROWTH 3 DAYS  Final   Report Status PENDING  Incomplete  Culture, blood (Routine X 2) w Reflex to ID Panel  Status: None (Preliminary result)   Collection Time: 02/10/16  9:00 AM  Result Value Ref Range Status   Specimen  Description BLOOD LEFT HAND  Final   Special Requests IN PEDIATRIC BOTTLE 1.5CC  Final   Culture NO GROWTH 3 DAYS  Final   Report Status PENDING  Incomplete  Culture, Urine     Status: Abnormal   Collection Time: 02/10/16  6:02 PM  Result Value Ref Range Status   Specimen Description URINE, CLEAN CATCH  Final   Special Requests NONE  Final   Culture 1,000 COLONIES/mL INSIGNIFICANT GROWTH (A)  Final   Report Status 02/12/2016 FINAL  Final         Radiology Studies: No results found.      Scheduled Meds: . amiodarone  400 mg Oral BID  . carvedilol  37.5 mg Oral BID WC  . docusate sodium  100 mg Oral BID  . feeding supplement (ENSURE ENLIVE)  237 mL Oral BID BM  . insulin aspart  0-5 Units Subcutaneous QHS  . insulin aspart  0-9 Units Subcutaneous TID WC  . isosorbide-hydrALAZINE  0.5 tablet Oral TID  . levothyroxine  75 mcg Oral QAC breakfast  . polyethylene glycol  17 g Oral Daily   Continuous Infusions: . sodium chloride 10 mL/hr at 02/11/16 1757     LOS: 12 days    Time spent: 40 minutes    WOODS, Geraldo Docker, MD [facility] Pager [phone]   If 7PM-7AM, please contact night-coverage [email or web address] Password [email or web address] 02/13/2016, 3:01 PM

## 2016-02-13 NOTE — Consult Note (Signed)
Requesting Physician: Dr. Sherral Hammers      Reason for consultation:  To evaluate bilateral lower extremity weakness  HPI:                                                                                                                                         Eric [name] is an 65 y.o. male patient post cardiac arrest, recovering well from a medical standpoint, has been in ICU for the past couple of weeks. After extubation he reported that he was able to stand up during ADLs for the first few days. But for about a week, his been having worsening bilateral lower extremity weakness proximally, able to move his ankles without any problem. Denies any numbness or paresthesias no symptoms involving his upper extremities no vision speech problems, no symptoms of altered mental status.  MRI of the lumbar spine was done which showed retroperitoneal hematomas and bilateral psoas muscles.  He is extremely claustrophobic phobic and hence further MRI studies could not be completed.  The etiology for retroperitoneal hematoma is unclear.  Past Medical History: Past Medical History  Diagnosis Date  . Claustrophobia   . Heart murmur   . Hypertension   . Varicose vein of leg     right  . Dysrhythmia     "palpitations"  . Migraine 02/13/12    "opthalmic"  . Chronic lower back pain   . Exertional dyspnea 01/2012  . CHF (congestive heart failure) ([place])   . Hypothyroidism   . Chronic kidney disease     kidney fx studies increased     Past Surgical History  Procedure Laterality Date  . Skin melanocytoma excision  2012    "above left clavicle"  . Finger surgery  2012    "4th digit right hand; thumb on left hand"  . Tee without cardioversion  03/22/2012    Procedure: TRANSESOPHAGEAL ECHOCARDIOGRAM (TEE);  Surgeon: Candee Furbish, MD;  Location: [facility] ENDOSCOPY;  Service: Cardiovascular;  Laterality: N/A;  . Cardioversion  03/22/2012    Procedure: CARDIOVERSION;  Surgeon: Candee Furbish, MD;  Location: [place];   Service: Cardiovascular;  Laterality: N/A;  . Cardioversion  04/19/2012    Procedure: CARDIOVERSION;  Surgeon: Sinclair Grooms, MD;  Location: [place];  Service: Cardiovascular;  Laterality: N/A;  . Colonoscopy N/A 04/11/2013    Procedure: COLONOSCOPY;  Surgeon: Inda Castle, MD;  Location: [facility] ENDOSCOPY;  Service: Endoscopy;  Laterality: N/A;  . Colonoscopy N/A 04/11/2013    Procedure: COLONOSCOPY;  Surgeon: Inda Castle, MD;  Location: [facility] ENDOSCOPY;  Service: Endoscopy;  Laterality: N/A;  . Tee without cardioversion N/A 02/08/2016    Procedure: TRANSESOPHAGEAL ECHOCARDIOGRAM (TEE);  Surgeon: Lelon Perla, MD;  Location: [facility] ENDOSCOPY;  Service: Cardiovascular;  Laterality: N/A;    Family History: Family History  Problem Relation Age of Onset  . Hypertension Mother   . Heart disease Mother   .  Hypertension Father   . Heart disease Father   . Heart failure Mother   . Diabetes Mother   . Heart failure Father   . Heart failure Mother     Social History:   reports that he has never smoked. He has never used smokeless tobacco. He reports that he drinks alcohol. He reports that he does not use illicit drugs.  Allergies:  Allergies  Allergen Reactions  . Lactose Intolerance (Gi) Diarrhea     Medications:                                                                                                                         Current facility-administered medications:  .  0.9 %  sodium chloride infusion, , Intravenous, Continuous, Collene Gobble, MD, Last Rate: 10 mL/hr at 02/11/16 1757 .  acetaminophen (TYLENOL) tablet 650 mg, 650 mg, Oral, Q4H PRN, [address] MD, 650 mg at 02/07/16 [id number] .  amiodarone (PACERONE) tablet 400 mg, 400 mg, Oral, BID, Norman Herrlich, MD, 400 mg at 02/13/16 0951 .  bisacodyl (DULCOLAX) EC tablet 5 mg, 5 mg, Oral, Daily PRN, Marijean Heath, NP, 5 mg at 02/06/16 1506 .  bisacodyl (DULCOLAX) suppository 10 mg, 10 mg, Rectal, Daily PRN,  Marijean Heath, NP .  carvedilol (COREG) tablet 37.5 mg, 37.5 mg, Oral, BID WC, Mihai Croitoru, MD, 37.5 mg at 02/13/16 1800 .  docusate sodium (COLACE) capsule 100 mg, 100 mg, Oral, BID, Marijean Heath, NP, 100 mg at 02/12/16 1003 .  feeding supplement (ENSURE ENLIVE) (ENSURE ENLIVE) liquid 237 mL, 237 mL, Oral, BID BM, Heather C Pitts, RD, 237 mL at 02/13/16 1400 .  fentaNYL (SUBLIMAZE) injection 25-50 mcg, 25-50 mcg, Intravenous, Q5 min PRN, Montez Hageman, MD .  hydrALAZINE (APRESOLINE) injection 10-20 mg, 10-20 mg, Intravenous, Q2H PRN, Thayer Headings, MD, 20 mg at 02/07/16 0905 .  insulin aspart (novoLOG) injection 0-5 Units, 0-5 Units, Subcutaneous, QHS, Dianne Dun, NP, 2 Units at 02/12/16 2143 .  insulin aspart (novoLOG) injection 0-9 Units, 0-9 Units, Subcutaneous, TID WC, Dianne Dun, NP, 2 Units at 02/13/16 1800 .  isosorbide-hydrALAZINE (BIDIL) 20-37.5 MG per tablet 0.5 tablet, 0.5 tablet, Oral, TID, Mihai Croitoru, MD, 0.5 tablet at 02/13/16 1620 .  labetalol (NORMODYNE,TRANDATE) injection 10 mg, 10 mg, Intravenous, Q6H PRN, Thayer Headings, MD, 10 mg at 02/07/16 0000 .  levothyroxine (SYNTHROID, LEVOTHROID) tablet 75 mcg, 75 mcg, Oral, QAC breakfast, Collene Gobble, MD, 75 mcg at 02/13/16 0753 .  meperidine (DEMEROL) injection 6.25 mg, 6.25 mg, Intravenous, Q5 min PRN, Montez Hageman, MD .  metoCLOPramide (REGLAN) injection 10 mg, 10 mg, Intravenous, Once PRN, Montez Hageman, MD .  metoprolol (LOPRESSOR) injection 5 mg, 5 mg, Intravenous, Q5 min PRN, Norman Herrlich, MD .  midazolam (VERSED) injection 2-4 mg, 2-4 mg, Intravenous, Once, Allie Bossier, MD .  morphine 2 MG/ML injection 1-2 mg, 1-2 mg, Intravenous, Q2H PRN, Curt Bears  A Whiteheart, NP, 2 mg at 02/13/16 1655 .  oxyCODONE-acetaminophen (PERCOCET/ROXICET) 5-325 MG per tablet 1 tablet, 1 tablet, Oral, Q6H PRN, Donita Brooks, NP, 1 tablet at 02/10/16 1738 .  polyethylene glycol (MIRALAX /  GLYCOLAX) packet 17 g, 17 g, Oral, Daily, Marijean Heath, NP, 17 g at 02/10/16 0953   ROS:                                                                                                                                       History obtained from the patient  General ROS: negative for - chills, fatigue, fever, night sweats, weight gain or weight loss Psychological ROS: negative for - behavioral disorder, hallucinations, memory difficulties, mood swings or suicidal ideation Ophthalmic ROS: negative for - blurry vision, double vision, eye pain or loss of vision ENT ROS: negative for - epistaxis, nasal discharge, oral lesions, sore throat, tinnitus or vertigo Allergy and Immunology ROS: negative for - hives or itchy/watery eyes Hematological and Lymphatic ROS: negative for - bleeding problems, bruising or swollen lymph nodes Endocrine ROS: negative for - galactorrhea, hair pattern changes, polydipsia/polyuria or temperature intolerance Respiratory ROS: negative for - cough, hemoptysis, shortness of breath or wheezing Cardiovascular ROS: negative for - chest pain, dyspnea on exertion, edema or irregular heartbeat Gastrointestinal ROS: negative for - abdominal pain, diarrhea, hematemesis, nausea/vomiting or stool incontinence Genito-Urinary ROS: negative for - dysuria, hematuria, incontinence or urinary frequency/urgency Musculoskeletal ROS: negative for - joint swelling or muscular weakness Neurological ROS: as noted in HPI Dermatological ROS: negative for rash and skin lesion changes  Neurologic Examination:                                                                                                    Today's Vitals   02/13/16 1642 02/13/16 1722 02/13/16 1800 02/13/16 1921  BP:   122/81 117/63  Pulse:   95 86  Temp: 98 F (36.7 C)   99.6 F (37.6 C)  TempSrc: Oral   Oral  Resp:   18 20  Height:      Weight:      SpO2:   99% 98%  PainSc: 9  3       Evaluation of higher  integrative functions including: Level of alertness: Alert,  Oriented to time, place and person Recent and remote memory - intact   Attention span and concentration  - intact   Speech: fluent, no evidence of dysarthria or aphasia noted.  Test the following cranial  nerves: 2-12 grossly intact Motor examination: Normal tone, bulk, full 5/5 motor strength in Bilateral upper extremities. In the lower extremity is, he had no motor strength with bilateral hip flexors, and hamstrings. No motor strength with the right quadriceps. On the left he had a minimal 1/5 strength at the quadriceps area and able to demonstrate full 5/5 strength with bilateral ankle dorsiflexion and plantarflexion. Examination of sensation : Normal sensation to pinprick in the face, by lateral upper extremities and in the left lower extremity. He did have a focal area of sensory loss in the right thigh both anteriorly and medially as well as in the right leg below knee up to the lower tibial level, but able to have normal sensation distally in his right foot.  Examination of deep tendon reflexes: 3+ in bilateral biceps triceps brachial radialis, absent bilateral knee reflexes, 2+ bilateral ankle reflexes. normal plantars bilaterally Test coordination: Normal finger nose testing, with no evidence of limb appendicular ataxia or abnormal involuntary movements or tremors noted.  Gait: Unable to perform due to leg weakness       Lab Results: Basic Metabolic Panel:  Recent Labs Lab 02/09/16 0723 02/10/16 0250 02/11/16 0406 02/12/16 0428 02/13/16 0322  NA 146* 144 144 145 143  K 4.4 4.2 3.9 4.1 4.0  CL 108 109 107 105 107  CO2 22 21* 25 27 27   GLUCOSE 138* 121* 160* 166* 179*  BUN 81* 98* 116* 100* 72*  CREATININE 2.87* 3.15* 3.53* 2.80* 2.22*  CALCIUM 9.1 8.6* 8.3* 8.2* 8.3*  MG 2.9* 2.8* 3.1* 3.0* 3.2*  PHOS 4.9* 5.5*  --   --   --     Liver Function Tests:  Recent Labs Lab 02/10/16 0827  AST 164*  ALT 56   ALKPHOS 42  BILITOT 1.1  PROT 7.6  ALBUMIN 2.7*   No results for input(s): LIPASE, AMYLASE in the last 168 hours. No results for input(s): AMMONIA in the last 168 hours.  CBC:  Recent Labs Lab 02/11/16 0406 02/11/16 1213 02/11/16 1930  02/12/16 0428  02/12/16 1918 02/12/16 2332 02/13/16 0322 02/13/16 0653 02/13/16 1200  WBC 26.6* 24.2* 24.3*  --  23.3*  --   --   --  22.1*  --   --   NEUTROABS 20.7*  --  19.7*  --  17.9*  --   --   --  17.3*  --   --   HGB 7.1* 6.7* 9.1*  < > 8.6*  < > 8.7* 8.5* 8.6* 8.7* 8.8*  HCT 21.9* 21.0* 27.8*  < > 26.3*  < > 27.3* 26.8* 27.4* 27.5* 28.0*  MCV 95.2 95.5 90.6  --  91.0  --   --   --  94.2  --   --   PLT 279 301 294  --  277  --   --   --  262  --   --   < > = values in this interval not displayed.  Cardiac Enzymes:  Recent Labs Lab 02/12/16 0428 02/12/16 1029 02/12/16 1557 02/12/16 2228 02/13/16 0322  CKTOTAL 4722* 4909* 3956* 3419* 3442*    Lipid Panel: No results for input(s): CHOL, TRIG, HDL, CHOLHDL, VLDL, LDLCALC in the last 168 hours.  CBG:  Recent Labs Lab 02/12/16 1615 02/12/16 2123 02/13/16 0802 02/13/16 1126 02/13/16 1646  GLUCAP 210* 206* 140* 215* 176*    Microbiology: Results for orders placed or performed during the hospital encounter of 01/31/16  Urine culture  Status: None   Collection Time: 01/31/16 10:36 PM  Result Value Ref Range Status   Specimen Description URINE, CATHETERIZED  Final   Special Requests NONE  Final   Culture NO GROWTH  Final   Report Status 02/02/2016 FINAL  Final  MRSA PCR Screening     Status: None   Collection Time: 02/01/16 12:51 AM  Result Value Ref Range Status   MRSA by PCR NEGATIVE NEGATIVE Final    Comment:        The GeneXpert MRSA Assay (FDA approved for NASAL specimens only), is one component of a comprehensive MRSA colonization surveillance program. It is not intended to diagnose MRSA infection nor to guide or monitor treatment for MRSA  infections.   Culture, blood (Routine X 2) w Reflex to ID Panel     Status: None   Collection Time: 02/01/16  1:14 AM  Result Value Ref Range Status   Specimen Description BLOOD RIGHT HAND  Final   Special Requests IN PEDIATRIC BOTTLE 2CC  Final   Culture NO GROWTH 5 DAYS  Final   Report Status 02/06/2016 FINAL  Final  Culture, blood (Routine X 2) w Reflex to ID Panel     Status: None   Collection Time: 02/01/16  1:14 AM  Result Value Ref Range Status   Specimen Description BLOOD RIGHT HAND  Final   Special Requests IN PEDIATRIC BOTTLE 1CC  Final   Culture NO GROWTH 5 DAYS  Final   Report Status 02/06/2016 FINAL  Final  Culture, Urine     Status: None   Collection Time: 02/04/16 12:11 PM  Result Value Ref Range Status   Specimen Description URINE, CATHETERIZED  Final   Special Requests Normal  Final   Culture NO GROWTH  Final   Report Status 02/05/2016 FINAL  Final  Culture, respiratory (NON-Expectorated)     Status: None   Collection Time: 02/04/16 12:16 PM  Result Value Ref Range Status   Specimen Description TRACHEAL ASPIRATE  Final   Special Requests Normal  Final   Gram Stain   Final    ABUNDANT WBC PRESENT,BOTH PMN AND MONONUCLEAR FEW SQUAMOUS EPITHELIAL CELLS PRESENT FEW GRAM POSITIVE COCCI IN PAIRS IN CLUSTERS FEW GRAM NEGATIVE RODS Performed at [facility]    Culture   Final    MODERATE CITROBACTER KOSERI Performed at [facility]    Report Status 02/07/2016 FINAL  Final   Organism ID, Bacteria CITROBACTER KOSERI  Final      Susceptibility   Citrobacter koseri - MIC*    CEFEPIME <=1 SENSITIVE Sensitive     CEFTAZIDIME <=1 SENSITIVE Sensitive     CEFTRIAXONE <=1 SENSITIVE Sensitive     CIPROFLOXACIN <=0.25 SENSITIVE Sensitive     GENTAMICIN <=1 SENSITIVE Sensitive     IMIPENEM <=0.25 SENSITIVE Sensitive     PIP/TAZO <=4 SENSITIVE Sensitive     TOBRAMYCIN <=1 SENSITIVE Sensitive     TRIMETH/SULFA Value in next row Sensitive      <=20  SENSITIVE(NOTE)    * MODERATE CITROBACTER KOSERI  Culture, blood (routine x 2)     Status: None   Collection Time: 02/04/16  1:50 PM  Result Value Ref Range Status   Specimen Description BLOOD LEFT HAND  Final   Special Requests IN PEDIATRIC BOTTLE 2CC  Final   Culture NO GROWTH 5 DAYS  Final   Report Status 02/09/2016 FINAL  Final  Culture, blood (routine x 2)     Status: None  Collection Time: 02/04/16  2:08 PM  Result Value Ref Range Status   Specimen Description BLOOD RIGHT HAND  Final   Special Requests IN PEDIATRIC BOTTLE .5CC  Final   Culture NO GROWTH 5 DAYS  Final   Report Status 02/09/2016 FINAL  Final  Culture, blood (Routine X 2) w Reflex to ID Panel     Status: None (Preliminary result)   Collection Time: 02/10/16  8:40 AM  Result Value Ref Range Status   Specimen Description BLOOD RIGHT HAND  Final   Special Requests BOTTLES DRAWN AEROBIC ONLY 4CC  Final   Culture NO GROWTH 3 DAYS  Final   Report Status PENDING  Incomplete  Culture, blood (Routine X 2) w Reflex to ID Panel     Status: None (Preliminary result)   Collection Time: 02/10/16  9:00 AM  Result Value Ref Range Status   Specimen Description BLOOD LEFT HAND  Final   Special Requests IN PEDIATRIC BOTTLE 1.5CC  Final   Culture NO GROWTH 3 DAYS  Final   Report Status PENDING  Incomplete  Culture, Urine     Status: Abnormal   Collection Time: 02/10/16  6:02 PM  Result Value Ref Range Status   Specimen Description URINE, CLEAN CATCH  Final   Special Requests NONE  Final   Culture 1,000 COLONIES/mL INSIGNIFICANT GROWTH (A)  Final   Report Status 02/12/2016 FINAL  Final     Imaging: Mr Lumbar Spine Wo Contrast  02/11/2016  CLINICAL DATA:  65 year old male status post collapse and cardiac arrest. Abrupt drop in hemoglobin from 9 to and 7 with associated new severe low back pain radiating to the hips and down both lower extremities. Initial encounter. EXAM: MRI LUMBAR SPINE WITHOUT CONTRAST - LIMITED TECHNIQUE:  Multiplanar, multisequence MR imaging of the lumbar spine was performed. No intravenous contrast was administered. COMPARISON:  Lumbar radiographs 02/07/2016 FINDINGS: I was contacted during the initial stages of attempted lumbar MRI by the technologist. This study was being attempted with the help of Anesthesia. The patient was to unstable for general anesthesia, and was not tolerating MRI well. The scout images demonstrate bilateral psoas muscle retroperitoneal hemorrhage (series 2, image 5 and image 20). No definite acute findings in the visible lumbar spine which is primarily seen sagittally on series 2, image 12. At 1024 hours I discussed the above with the Anesthesiologist. We agreed that the patient would be better served with CT Abdomen and Pelvis and decision was made to discontinue additional MRI imaging. IMPRESSION: Bilateral retroperitoneal hemorrhage involving the psoas musculature. Follow-up CT Abdomen and Pelvis could characterize further. Lumbar MRI discontinued prior to completion as described above. This was discussed by telephone with Anesthesia at 1024 hours, and again with Dr. Dani Gobble Croitoru On 02/11/2016 at 1315 hours. Electronically Signed   By: Genevie Ann M.D.   On: 02/11/2016 13:27   Dg Chest Port 1 View  02/10/2016  CLINICAL DATA:  Shortness of breath and leukocytosis. EXAM: PORTABLE CHEST 1 VIEW COMPARISON:  02/07/2016 FINDINGS: Lungs are adequately inflated without consolidation or effusion. Mild stable cardiomegaly. Minimal degenerative change of the spine. IMPRESSION: No acute cardiopulmonary disease. Mild stable cardiomegaly. Electronically Signed   By: Marin Olp M.D.   On: 02/10/2016 09:54    Assessment and plan:   Paras Tellman is an 65 y.o. male patient who is post cardiac arrest, has been bedridden in the ICU for the past couple of weeks. She is noted to have worsening bilateral proximal lower extremity  weakness, with MRI of the lumbar spine showing retroperitoneal  hematomas in bilateral psoas muscles. His neurological examination shows bilateral hip flexor quadriceps and hamstring weakness preserved strength with ankle dorsal flexion and plantar flexion, no weakness in upper extremities, preserved reflexes in bilateral upper extremities as well as and ankles with absent reflexes in the knees. I suspect bilateral lumbar plexopathy secondary to a possible mass effect from a retroperitoneal or pelvic hematoma is the likely etiology. Recommend CT of the abdomen and pelvis for further evaluation. At the same time also obtain a CT of the brain, cervical and thoracic spine for imaging of the neural axis to rule out any proximal etiology such as spinal cord compression or intracranial pathology which could account for his lower extremity weakness although less likely as described above. MRI of the brain was previously attempted, but patient is extremely claustrophobic and requested general anesthesia. Hence MRI studies are deferred for now.  Based on the CT of the abdomen and pelvis, consider consulting surgery for possible hematoma evacuation to relieve any mass effect if a significant amount of hematoma is noted within the pelvis compressing the lumbar plexus.   Dr. Leonel Ramsay will continue to follow for neurology service starting tomorrow morning. Please call for any further questions.

## 2016-02-13 NOTE — Progress Notes (Signed)
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  Subjective:  No new issues, wife in room, updated Hb stable Good UOP CK further improved Good improvemetn in BUN/SCr Still needs LHC   05/20 0701 - 05/21 0700 In: 1257 [P.O.:1257] Out: 1100 [Urine:1100]  Filed Weights   02/11/16 0500 02/12/16 0311 02/13/16 0427  Weight: 109.7 kg (241 lb 13.5 oz) 113 kg (249 lb 1.9 oz) 113.399 kg (250 lb)    Scheduled Meds: . amiodarone  400 mg Oral BID  . carvedilol  37.5 mg Oral BID WC  . docusate sodium  100 mg Oral BID  . feeding supplement (ENSURE ENLIVE)  237 mL Oral BID BM  . insulin aspart  0-5 Units Subcutaneous QHS  . insulin aspart  0-9 Units Subcutaneous TID WC  . isosorbide-hydrALAZINE  0.5 tablet Oral TID  . levothyroxine  75 mcg Oral QAC breakfast  . polyethylene glycol  17 g Oral Daily   Continuous Infusions: . sodium chloride 10 mL/hr at 02/11/16 1757   PRN Meds:.acetaminophen, bisacodyl, bisacodyl, fentaNYL (SUBLIMAZE) injection, hydrALAZINE, labetalol, meperidine (DEMEROL) injection, metoCLOPramide, metoprolol, morphine injection, oxyCODONE-acetaminophen  Current Labs: reviewed    Physical Exam:  Blood pressure 141/81, pulse 93, temperature 98.5 F (36.9 C), temperature source Oral, resp. rate 18, height 5\' 9"  (1.753 m), weight 113.399 kg (250 lb), SpO2 92 %. GEN: NAD ENT: NCAT EYES: EOMI CV: IRIR PULM: diminished in bases ABD: s/nt/nd SKIN: No rashes/lesions EXT:No LEE Nonfocal. AAOx3, CN2-12 intact  RENAL STUDIES 1. 5/17 Renal US: no structural or obstructive issues 2. 5/17 CK elevated 3457 3. 5/18 CK 5788; AST elevated as well 4. 5/19 CK 6520 5. 5/19 MRI Lumbar spine: limited, b/l iliopsoas hematoma  A 1. 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 Renal US w/o obstruction  2. Recovering nicely 3. Sees Powell at [facility]; BL SCr 1.8-2.0 4. Daily weights, Daily Renal Panel, Strict I/Os, Avoid nephrotoxins (NSAIDs,  judicious IV Contrast)  5. Cont to follow along with anticipated LHC 2. Rhabdomyolysis 1. Statin stopped 5/17 2. ? Related to RP hematoma as well 2. RP hematoma while on anticoagulation into ileopsoas; off heparin 3. Cardiac Arrest 5/8: needs LHC and ICD 4. HTN: BP stable on carvediolol 5. Leg pain / weakness likely 2/2 IP RP bleed 6. Aortic Insufficiency 7. AFib : heparin held 8. Systolic CHF 9. HCAP s/p ABX   Pearson Grippe MD 02/13/2016, 8:40 AM   Recent Labs Lab 02/09/16 0723 02/10/16 0250 02/11/16 0406 02/12/16 0428 02/13/16 0322  NA 146* 144 144 145 143  K 4.4 4.2 3.9 4.1 4.0  CL 108 109 107 105 107  CO2 22 21* 25 27 27   GLUCOSE 138* 121* 160* 166* 179*  BUN 81* 98* 116* 100* 72*  CREATININE 2.87* 3.15* 3.53* 2.80* 2.22*  CALCIUM 9.1 8.6* 8.3* 8.2* 8.3*  PHOS 4.9* 5.5*  --   --   --     Recent Labs Lab 02/11/16 1930  02/12/16 0428  02/12/16 1918 02/12/16 2332 02/13/16 0322  WBC 24.3*  --  23.3*  --   --   --  22.1*  NEUTROABS 19.7*  --  17.9*  --   --   --  17.3*  HGB 9.1*  < > 8.6*  < > 8.7* 8.5* 8.6*  HCT 27.8*  < > 26.3*  < > 27.3* 26.8* 27.4*  MCV 90.6  --  91.0  --   --   --  94.2  PLT  294  --  277  --   --   --  262  < > = values in this interval not displayed.

## 2016-02-14 DIAGNOSIS — G5720 Lesion of femoral nerve, unspecified lower limb: Secondary | ICD-10-CM | POA: Insufficient documentation

## 2016-02-14 DIAGNOSIS — I5022 Chronic systolic (congestive) heart failure: Secondary | ICD-10-CM | POA: Diagnosis not present

## 2016-02-14 DIAGNOSIS — I4901 Ventricular fibrillation: Secondary | ICD-10-CM | POA: Diagnosis not present

## 2016-02-14 DIAGNOSIS — I351 Nonrheumatic aortic (valve) insufficiency: Secondary | ICD-10-CM | POA: Diagnosis not present

## 2016-02-14 DIAGNOSIS — I469 Cardiac arrest, cause unspecified: Secondary | ICD-10-CM | POA: Diagnosis not present

## 2016-02-14 DIAGNOSIS — M6282 Rhabdomyolysis: Secondary | ICD-10-CM

## 2016-02-14 DIAGNOSIS — I712 Thoracic aortic aneurysm, without rupture: Secondary | ICD-10-CM | POA: Diagnosis not present

## 2016-02-14 DIAGNOSIS — N179 Acute kidney failure, unspecified: Secondary | ICD-10-CM | POA: Diagnosis not present

## 2016-02-14 LAB — BASIC METABOLIC PANEL
ANION GAP: 5 (ref 5–15)
BUN: 51 mg/dL — ABNORMAL HIGH (ref 6–20)
CALCIUM: 8.3 mg/dL — AB (ref 8.9–10.3)
CO2: 26 mmol/L (ref 22–32)
Chloride: 112 mmol/L — ABNORMAL HIGH (ref 101–111)
Creatinine, Ser: 1.83 mg/dL — ABNORMAL HIGH (ref 0.61–1.24)
GFR, EST AFRICAN AMERICAN: 43 mL/min — AB (ref >60)
GFR, EST NON AFRICAN AMERICAN: 37 mL/min — AB (ref >60)
Glucose, Bld: 141 mg/dL — ABNORMAL HIGH (ref 65–99)
POTASSIUM: 4.1 mmol/L (ref 3.5–5.1)
SODIUM: 143 mmol/L (ref 135–145)

## 2016-02-14 LAB — ANTINUCLEAR ANTIBODIES, IFA: ANA Ab, IFA: NEGATIVE

## 2016-02-14 LAB — GLUCOSE, CAPILLARY
GLUCOSE-CAPILLARY: 131 mg/dL — AB (ref 65–99)
GLUCOSE-CAPILLARY: 172 mg/dL — AB (ref 65–99)
GLUCOSE-CAPILLARY: 151 mg/dL — AB (ref 65–99)
Glucose-Capillary: 195 mg/dL — ABNORMAL HIGH (ref 65–99)

## 2016-02-14 LAB — CBC WITH DIFFERENTIAL/PLATELET
BASOS ABS: 0.1 10*3/uL (ref 0.0–0.1)
BASOS PCT: 0 %
EOS ABS: 0.2 10*3/uL (ref 0.0–0.7)
EOS PCT: 1 %
HCT: 27.4 % — ABNORMAL LOW (ref 39.0–52.0)
HEMOGLOBIN: 8.7 g/dL — AB (ref 13.0–17.0)
LYMPHS ABS: 2.4 10*3/uL (ref 0.7–4.0)
LYMPHS PCT: 12 %
MCH: 30.3 pg (ref 26.0–34.0)
MCHC: 31.8 g/dL (ref 30.0–36.0)
MCV: 95.5 fL (ref 78.0–100.0)
Monocytes Absolute: 2 10*3/uL — ABNORMAL HIGH (ref 0.1–1.0)
Monocytes Relative: 10 %
Neutro Abs: 15.7 10*3/uL — ABNORMAL HIGH (ref 1.7–7.7)
Neutrophils Relative %: 77 %
PLATELETS: 229 10*3/uL (ref 150–400)
RBC: 2.87 MIL/uL — AB (ref 4.22–5.81)
RDW: 15.7 % — ABNORMAL HIGH (ref 11.5–15.5)
WBC: 20.4 10*3/uL — AB (ref 4.0–10.5)

## 2016-02-14 LAB — MAGNESIUM: MAGNESIUM: 2.7 mg/dL — AB (ref 1.7–2.4)

## 2016-02-14 LAB — PHOSPHORUS: Phosphorus: 2.3 mg/dL — ABNORMAL LOW (ref 2.5–4.6)

## 2016-02-14 LAB — CK: CK TOTAL: 2121 U/L — AB (ref 49–397)

## 2016-02-14 MED ORDER — LABETALOL HCL 5 MG/ML IV SOLN
10.0000 mg | Freq: Four times a day (QID) | INTRAVENOUS | Status: DC | PRN
Start: 2016-02-14 — End: 2016-02-15

## 2016-02-14 MED ORDER — AMIODARONE HCL 200 MG PO TABS
400.0000 mg | ORAL_TABLET | Freq: Every day | ORAL | Status: DC
  Administered 2016-02-14 – 2016-02-22 (×9): 400 mg via ORAL
  Filled 2016-02-14 (×9): qty 2

## 2016-02-14 NOTE — Progress Notes (Signed)
Pt wanted SCD's off. Educated pt.  Will continue to monitor Eric [name] T

## 2016-02-14 NOTE — Progress Notes (Signed)
[facility] TEAM 1 - Stepdown/ICU TEAM  Eric [name]  [id number] DOB: Sep 07, 1951 DOA: 01/31/2016 PCP: [name], MD    Brief Narrative:  65 yo M Hx Chronic Systolic CHF, A-fib on Eliquis, HTN, Heart murmur, CKD, Hypothyroidism, and Chronic Pain Syndrome (low back) who was in his USOH until the evening of 05/08 when he began to breath very heavily while driving, before becoming unresponsive. His wife immediately got the car stopped, got him out of the car and began CPR while calling EMS.  EMS arrived within 5 - 10 minutes and upon their arrival, he received 2 defibrillations via AED. ROSC was achieved within roughly 15 - 20 minutes.  Upon ED arrival, he remained unresponsive and was subsequently intubated. PCCM was called for admission and consideration of hypothermia protocol. Cardiology was also called in consultation.  Assessment & Plan:  Cardiac arrest - VT/Vfib -CPR performed by wife and EMS - multiple shocks -Hypothermia protocol performed -Cardiology following - for eventual cath this admit, once renal fxn stabilized   LBBB  Chronic systolic CHF  -EF [id number] per TTE April 2017 - no evidence of signif overload at this time   Aortic aneurysm / aortic root enlargement  -4.9 cm aortic aneurysm - will need f/u CT of chest once Cr improved   Aortic valve regurgitation Cards following - to consider CT in 6 months in f/u   HTN  -Stable  Paroxysmal A.fib  -Hold all anticoagulants secondary to acute blood loss anemia - NSR at present   CITROBACTER KOSERI Aspiration Pneumonia  -Completed 7 day course antibiotics - no clinical sx to suggest ongoing infection at this time   Acute encephalopathy  -Resolved - alert and oriented today   New-onset bilateral LE weakness and numbness -believed to be due to bilateral retroperitoneal bleeds w/ compression of B femoral nerves - I discussed w/ Neurology, and with Gen Surgery as well - at the time of my exam today, the pt tells me  his strength is improving, tough he remains numb in both thighs - I discussed the consideration of surgical decompression w/ the pt, but given his improving strength, and his high risk for general anesthesia at this time, I have explained that I do not feel it is worth the risk - the pt agreed - will cont to follow clinically   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 -Synthroid 75 g daily  -TSH normal   Acute blood loss anemia / Bilateral Retroperitoneal Bleeds Hgb appears to have stabilized for now - follow   DVT prophylaxis:  Code Status: FULL CODE Family Communication: no family present at time of exam  Disposition Plan: SDU  Consultants:  [facility] Cardiology  Nephrology  Neurology PCCM   Significant Events: 05/08 - cardiac arrest. Started on TTM - 33 degrees C CT head 05/08 > no acute process Echo 5/9 > EF 30 to 35%, severe aortic regurgitation EEG 5/9 > no focal, hemispheric, or lateralizing features; demonstrated a burst/suppression pattern, which can be consistent with a sedated EEG 5/11>>mod diffuse slowing, No epileptiform activity was recorded. 5/10 pt rewarmed per hypothermia protocol 5/14 > Extubated 5/15 Hip spine films > no acute abnormality 5/16 TEE - EF= 30%- 35%. Diffuse hypokinesis -  Aortic valve: Moderate regurgitation.- Left atrium: Moderately dilated.  5/19 MRI L-spine - Bilateral retroperitoneal hemorrhage involving the psoas musculature. Follow-up CT Abdomen and Pelvis could characterize further.  Antimicrobials:  Zosyn 5/11> 5/16 Vancomycin 5/11> 5/14 Ceftriaxone 5/16 > 5/18  Subjective: The pt is sitting up in a bedside chair.  He denies cp, sob, n/v, or abdom pain.  He is  able to lift his L foot off the ground, and wiggle it around.  He is able to move his R foot, but not pick it up off the ground.  He tells me both thighs feel numb.    Objective: Blood pressure 125/72, pulse 76, temperature 99.5 F (37.5 C), temperature source Oral, resp. rate 20, height 5\' 9"  (1.753 m), weight 110.5 kg (243 lb 9.7 oz), SpO2 100 %.  Intake/Output Summary (Last 24 hours) at 02/14/16 1506 Last data filed at 02/14/16 1400  Gross per 24 hour  Intake   1080 ml  Output   1625 ml  Net   -545 ml   Filed Weights   02/12/16 0311 02/13/16 0427 02/14/16 0318  Weight: 113 kg (249 lb 1.9 oz) 113.399 kg (250 lb) 110.5 kg (243 lb 9.7 oz)    Examination: General: No acute respiratory distress Lungs: Clear to auscultation bilaterally without wheezes or crackles Cardiovascular: Regular rate and rhythm without murmur gallop or rub normal S1 and S2 Abdomen: Nontender, nondistended, soft, bowel sounds positive, no rebound, no ascites, no appreciable mass Extremities: No significant cyanosis, clubbing, or edema bilateral lower extremities  CBC:  Recent Labs Lab 02/11/16 0406 02/11/16 1213 02/11/16 1930  02/12/16 0428  02/12/16 2332 02/13/16 0322 02/13/16 0653 02/13/16 1200 02/14/16 0310  WBC 26.6* 24.2* 24.3*  --  23.3*  --   --  22.1*  --   --  20.4*  NEUTROABS 20.7*  --  19.7*  --  17.9*  --   --  17.3*  --   --  15.7*  HGB 7.1* 6.7* 9.1*  < > 8.6*  < > 8.5* 8.6* 8.7* 8.8* 8.7*  HCT 21.9* 21.0* 27.8*  < > 26.3*  < > 26.8* 27.4* 27.5* 28.0* 27.4*  MCV 95.2 95.5 90.6  --  91.0  --   --  94.2  --   --  95.5  PLT 279 301 294  --  277  --   --  262  --   --  229  < > = values in this interval not displayed. Basic Metabolic Panel:  Recent Labs Lab 02/09/16 0723 02/10/16 0250 02/11/16 0406 02/12/16 0428 02/13/16 0322 02/14/16 0310  NA 146* 144 144 145 143 143  K 4.4 4.2 3.9 4.1 4.0 4.1  CL 108 109 107 105 107 112*  CO2 22 21* 25 27 27 26   GLUCOSE 138* 121* 160* 166*  179* 141*  BUN 81* 98* 116* 100* 72* 51*  CREATININE 2.87* 3.15* 3.53* 2.80* 2.22* 1.83*  CALCIUM 9.1 8.6* 8.3* 8.2* 8.3* 8.3*  MG 2.9* 2.8* 3.1* 3.0* 3.2* 2.7*  PHOS 4.9* 5.5*  --   --   --  2.3*   GFR: Estimated Creatinine Clearance: 50 mL/min (by C-G formula based on Cr of 1.83).  Liver Function Tests:  Recent Labs Lab 02/10/16 0827  AST 164*  ALT 56  ALKPHOS 42  BILITOT 1.1  PROT 7.6  ALBUMIN 2.7*   Coagulation Profile:  Recent Labs Lab 02/11/16 1326  INR 1.42    Cardiac Enzymes:  Recent Labs Lab 02/12/16 1029  02/12/16 1557 02/12/16 2228 02/13/16 0322 02/14/16 0310  CKTOTAL 4909* 3956* 3419* 3442* 2121*    HbA1C: HGB A1C MFR BLD  Date/Time Value Ref Range Status  02/10/2016 03:47 PM 7.1* 4.8 - 5.6 % Final    Comment:    (NOTE)         Pre-diabetes: 5.7 - 6.4         Diabetes: >6.4         Glycemic control for adults with diabetes: <7.0   02/14/2012 06:20 AM 6.1* <5.7 % Final    Comment:    (NOTE)                                                                       According to the ADA Clinical Practice Recommendations for 2011, when HbA1c is used as a screening test:  >=6.5%   Diagnostic of Diabetes Mellitus           (if abnormal result is confirmed) 5.7-6.4%   Increased risk of developing Diabetes Mellitus References:Diagnosis and Classification of Diabetes Mellitus,Diabetes [id number] 1):S62-S69 and Standards of Medical Care in         Diabetes - 2011,Diabetes [id number] (Suppl 1):S11-S61.    CBG:  Recent Labs Lab 02/13/16 1126 02/13/16 1646 02/13/16 2156 02/14/16 0735 02/14/16 1107  GLUCAP 215* 176* 207* 131* 172*    Recent Results (from the past 240 hour(s))  Culture, blood (Routine X 2) w Reflex to ID Panel     Status: None (Preliminary result)   Collection Time: 02/10/16  8:40 AM  Result Value Ref Range Status   Specimen Description BLOOD RIGHT HAND  Final   Special Requests BOTTLES DRAWN AEROBIC ONLY 4CC  Final     Culture NO GROWTH 4 DAYS  Final   Report Status PENDING  Incomplete  Culture, blood (Routine X 2) w Reflex to ID Panel     Status: None (Preliminary result)   Collection Time: 02/10/16  9:00 AM  Result Value Ref Range Status   Specimen Description BLOOD LEFT HAND  Final   Special Requests IN PEDIATRIC BOTTLE 1.5CC  Final   Culture NO GROWTH 4 DAYS  Final   Report Status PENDING  Incomplete  Culture, Urine     Status: Abnormal   Collection Time: 02/10/16  6:02 PM  Result Value Ref Range Status   Specimen Description URINE, CLEAN CATCH  Final   Special Requests NONE  Final   Culture 1,000 COLONIES/mL INSIGNIFICANT GROWTH (A)  Final   Report Status 02/12/2016 FINAL  Final     Scheduled Meds: . amiodarone  400 mg Oral Daily  . carvedilol  37.5 mg Oral BID WC  . docusate sodium  100 mg Oral BID  . feeding supplement (ENSURE ENLIVE)  237 mL Oral BID BM  . insulin aspart  0-5 Units Subcutaneous QHS  . insulin aspart  0-9 Units Subcutaneous TID WC  . isosorbide-hydrALAZINE  0.5 tablet Oral TID  . levothyroxine  75 mcg Oral QAC breakfast  . polyethylene glycol  17 g Oral Daily   Continuous Infusions: . sodium chloride 10 mL/hr at 02/11/16 1757     LOS: 13 days   Time spent: 35 minutes   Cherene Altes, MD [facility]  Office  [phone] Pager - Text Page per Shea Evans as per below:  On-Call/Text Page:      [email or web address]      password [email or web address]  If 7PM-7AM, please contact night-coverage [email or web address] Password [facility] 02/14/2016, 3:06 PM

## 2016-02-14 NOTE — Progress Notes (Signed)
Requesting Physician: Dr. 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 Prior to presentation, pt was unresponsive, Wife/daughter performed CPR, EMS- gave 2 shocks with ROSC, down time- 10-83mins. He was intubated, and placed on hypothermia protocol. Pt was extubated- 5/15.  Pt is planned for a Cardiac Cath possibly later this week, but her has CKD  With new rhabdomyolysis. Also planned possible CRD- T placement with is prolonged QRS, with BBB. ICD interrogation revealed V. Fibrillation. TEE showed progressed Aortic vavle disease, worse Ef- now 30-35%.  He developed lower extremity weakness- bilateral- ~ 1 week, and had an MRI lumber spine 5/19, - which showed bilateral retroperitoneal hemorrhage with extension to Psoas muscle. MRi could not be completed as pt has hx of claustrophobia, so Ct scan was ordred- which showed same findings, but more prominent on the left.  Today-  He also has some back pain which is more positional- better when sitting up than lying down. He denies falls. His weakness has remained the same since he noticed it- denies worsening or progression, he also noted weakness is worse on his right. He feels the sensation in his legs is abnormal. ? Possible tingling present.  No significant pain in his extremities. He denies any weakness of his arms, or abnormal sensation. No change in vision, no dysarthria, no facial asymm, no bowel or bladder problems.  At this time the etiology for retroperitoneal hematoma is unclear. Pt was on heparin on admission for atria fib, he was on NOAC at home.   Past Medical History: Past Medical History  Diagnosis Date  . Claustrophobia   . Heart murmur   . Hypertension   . Varicose vein of  leg     right  . Dysrhythmia     "palpitations"  . Migraine 02/13/12    "opthalmic"  . Chronic lower back pain   . Exertional dyspnea 01/2012  . CHF (congestive heart failure) ([place])   . Hypothyroidism   . Chronic kidney disease     kidney fx studies increased    Medications:                                                                                                                         Current facility-administered medications:  .  0.9 %  sodium chloride infusion, , Intravenous, Continuous, Collene Gobble, MD, Last Rate: 10 mL/hr at 02/11/16 1757 .  acetaminophen (TYLENOL) tablet 650 mg, 650 mg, Oral, Q4H PRN, [address], MD, 650 mg at 02/07/16 [id number] .  amiodarone (PACERONE) tablet 400 mg, 400 mg, Oral, Daily, Peter M Martinique, MD .  bisacodyl (DULCOLAX) EC tablet 5 mg, 5 mg, Oral, Daily PRN, Marijean Heath, NP, 5 mg at  02/06/16 1506 .  bisacodyl (DULCOLAX) suppository 10 mg, 10 mg, Rectal, Daily PRN, Marijean Heath, NP .  carvedilol (COREG) tablet 37.5 mg, 37.5 mg, Oral, BID WC, Mihai Croitoru, MD, 37.5 mg at 02/13/16 1800 .  docusate sodium (COLACE) capsule 100 mg, 100 mg, Oral, BID, Marijean Heath, NP, 100 mg at 02/13/16 2202 .  feeding supplement (ENSURE ENLIVE) (ENSURE ENLIVE) liquid 237 mL, 237 mL, Oral, BID BM, Heather C Pitts, RD, 237 mL at 02/13/16 1400 .  fentaNYL (SUBLIMAZE) injection 25-50 mcg, 25-50 mcg, Intravenous, Q5 min PRN, Montez Hageman, MD .  hydrALAZINE (APRESOLINE) injection 10-20 mg, 10-20 mg, Intravenous, Q2H PRN, Thayer Headings, MD, 20 mg at 02/07/16 0905 .  insulin aspart (novoLOG) injection 0-5 Units, 0-5 Units, Subcutaneous, QHS, Dianne Dun, NP, 2 Units at 02/13/16 2210 .  insulin aspart (novoLOG) injection 0-9 Units, 0-9 Units, Subcutaneous, TID WC, Dianne Dun, NP, 2 Units at 02/13/16 1800 .  isosorbide-hydrALAZINE (BIDIL) 20-37.5 MG per tablet 0.5 tablet, 0.5 tablet, Oral, TID, Mihai Croitoru, MD, 0.5  tablet at 02/13/16 2203 .  labetalol (NORMODYNE,TRANDATE) injection 10 mg, 10 mg, Intravenous, Q6H PRN, Thayer Headings, MD, 10 mg at 02/07/16 0000 .  levothyroxine (SYNTHROID, LEVOTHROID) tablet 75 mcg, 75 mcg, Oral, QAC breakfast, Collene Gobble, MD, 75 mcg at 02/13/16 0753 .  meperidine (DEMEROL) injection 6.25 mg, 6.25 mg, Intravenous, Q5 min PRN, Montez Hageman, MD .  metoCLOPramide (REGLAN) injection 10 mg, 10 mg, Intravenous, Once PRN, Montez Hageman, MD .  metoprolol (LOPRESSOR) injection 5 mg, 5 mg, Intravenous, Q5 min PRN, Norman Herrlich, MD .  morphine 2 MG/ML injection 1-2 mg, 1-2 mg, Intravenous, Q2H PRN, Marijean Heath, NP, 2 mg at 02/13/16 2310 .  oxyCODONE-acetaminophen (PERCOCET/ROXICET) 5-325 MG per tablet 1 tablet, 1 tablet, Oral, Q6H PRN, Donita Brooks, NP, 1 tablet at 02/10/16 1738 .  polyethylene glycol (MIRALAX / GLYCOLAX) packet 17 g, 17 g, Oral, Daily, Marijean Heath, NP, 17 g at 02/10/16 0953  Neurologic Examination:                                                                                                    Today's Vitals   02/14/16 0318 02/14/16 0700 02/14/16 0732 02/14/16 0800  BP: 119/70  146/85   Pulse: 84 83  81  Temp: 98.5 F (36.9 C)  98.4 F (36.9 C)   TempSrc: Oral  Oral   Resp: 15 14 19 15   Height:      Weight: 243 lb 9.7 oz (110.5 kg)     SpO2: 98% 100% 97% 97%  PainSc:        Physical Examination-  General- Alert and oriented Abd- full, non tender. Cardiac- RRR Extremities- lower extremity edema, worse on left. Neuro Examination-  Level of alertness: Alert, Oriented to time, place and person Attention span and concentration  - intact   Speech: fluent, no evidence of dysarthria or aphasia noted.  Test the following cranial nerves: 2-12 grossly intact Appears to have some generalized muscle atrophy. Motor examination: Normal tone,  bulk, full 5/5 motor strength in Bilateral upper extremities, DTR- Biceps tendon- 2/2. In the  lower extremity Hip flexors- 3/5- bilat can not raise thighs against gravity, feet dorsiflexion and extension- 5/5, DTR- Patella reflex- 0/2. Planter response- flexor bilat.  Finger to nose- Intact, Rapid alternating movement intact. Could not do ankle to chin- Poor strenght.  Gait- Not tested    Lab Results: Basic Metabolic Panel:  Recent Labs Lab 02/09/16 0723 02/10/16 0250 02/11/16 0406 02/12/16 0428 02/13/16 0322 02/14/16 0310  NA 146* 144 144 145 143 143  K 4.4 4.2 3.9 4.1 4.0 4.1  CL 108 109 107 105 107 112*  CO2 22 21* 25 27 27 26   GLUCOSE 138* 121* 160* 166* 179* 141*  BUN 81* 98* 116* 100* 72* 51*  CREATININE 2.87* 3.15* 3.53* 2.80* 2.22* 1.83*  CALCIUM 9.1 8.6* 8.3* 8.2* 8.3* 8.3*  MG 2.9* 2.8* 3.1* 3.0* 3.2* 2.7*  PHOS 4.9* 5.5*  --   --   --   --    Liver Function Tests:  Recent Labs Lab 02/10/16 0827  AST 164*  ALT 56  ALKPHOS 42  BILITOT 1.1  PROT 7.6  ALBUMIN 2.7*   CBC:  Recent Labs Lab 02/11/16 0406 02/11/16 1213 02/11/16 1930  02/12/16 0428  02/12/16 2332 02/13/16 0322 02/13/16 0653 02/13/16 1200 02/14/16 0310  WBC 26.6* 24.2* 24.3*  --  23.3*  --   --  22.1*  --   --  20.4*  NEUTROABS 20.7*  --  19.7*  --  17.9*  --   --  17.3*  --   --  15.7*  HGB 7.1* 6.7* 9.1*  < > 8.6*  < > 8.5* 8.6* 8.7* 8.8* 8.7*  HCT 21.9* 21.0* 27.8*  < > 26.3*  < > 26.8* 27.4* 27.5* 28.0* 27.4*  MCV 95.2 95.5 90.6  --  91.0  --   --  94.2  --   --  95.5  PLT 279 301 294  --  277  --   --  262  --   --  229  < > = values in this interval not displayed.  Cardiac Enzymes:  Recent Labs Lab 02/12/16 1029 02/12/16 1557 02/12/16 2228 02/13/16 0322 02/14/16 0310  CKTOTAL 4909* 3956* 3419* 3442* 2121*   Lipid Panel: No results for input(s): CHOL, TRIG, HDL, CHOLHDL, VLDL, LDLCALC in the last 168 hours.  CBG:  Recent Labs Lab 02/13/16 0802 02/13/16 1126 02/13/16 1646 02/13/16 2156 02/14/16 0735  GLUCAP 140* 215* 176* 207* 131*   Imaging: Ct  Abdomen Pelvis Wo Contrast  02/14/2016  CLINICAL DATA:  Status post cardiac arrest, with bilateral retroperitoneal bleeds and lower extremity weakness and paresthesia. Lower back pain. Patient on Eliquis. EXAM: CT ABDOMEN AND PELVIS WITHOUT CONTRAST TECHNIQUE: Multidetector CT imaging of the abdomen and pelvis was performed following the standard protocol without IV contrast. COMPARISON:  MRI of the lumbar spine performed 02/11/2016 FINDINGS: Minimal bibasilar atelectasis is noted. The liver and spleen are unremarkable in appearance. The gallbladder is within normal limits. The pancreas and adrenal glands are unremarkable. Nonspecific perinephric stranding is noted bilaterally. A 1.5 cm cyst is noted at the lower pole of the right kidney. The kidneys are otherwise unremarkable. There is no evidence of hydronephrosis. No renal or ureteral stones are identified. No free fluid is identified. The small bowel is unremarkable in appearance. The stomach is within normal limits. No acute vascular abnormalities are seen. There is mild ectasia of the distal abdominal  aorta and common iliac arteries bilaterally, without evidence of significant aneurysmal dilatation. The appendix is normal in caliber and contains contrast, without evidence of appendicitis. The colon is grossly unremarkable in appearance. The bladder is mildly distended and grossly unremarkable. The prostate is borderline normal in size. No inguinal lymphadenopathy is seen. Hemorrhage is noted along the psoas musculature bilaterally, and along the iliacus musculature, with overlying hemorrhage, more prominent on the left. Mild hemorrhage is also seen tracking into the proximal lower extremities bilaterally. Mild soft tissue inflammation is noted overlying the psoas musculature. No acute osseous abnormalities are identified. Facet disease is noted at the lower lumbar spine. IMPRESSION: 1. Hemorrhage along the psoas musculature bilaterally, and along the iliacus  musculature, with overlying hemorrhage, more prominent on the left. Mild hemorrhage tracking into the proximal lower extremities bilaterally. Mild soft tissue inflammation overlying the psoas musculature. 2. Small left renal cyst noted. 3. Mild ectasia of the distal abdominal aorta and common iliac arteries bilaterally, without significant aneurysmal dilatation. Electronically Signed   By: Garald Balding M.D.   On: 02/14/2016 04:07   Ct Head Wo Contrast  02/14/2016  CLINICAL DATA:  Post cardiac arrest requiring hypothermic protocol. Bilateral lower extremity weakness and paresthesia. Evaluate for CVA. EXAM: CT HEAD WITHOUT CONTRAST CT CERVICAL SPINE WITHOUT CONTRAST TECHNIQUE: Multidetector CT imaging of the head and cervical spine was performed following the standard protocol without intravenous contrast. Multiplanar CT image reconstructions of the cervical spine were also generated. COMPARISON:  Head CT 01/31/2016 FINDINGS: CT HEAD FINDINGS No intracranial hemorrhage, mass effect, or midline shift. No hydrocephalus. The basilar cisterns are patent. No evidence of territorial infarct. No intracranial fluid collection. Atherosclerosis of skullbase vasculature. Calvarium is intact. Mild mucosal thickening of left frontal sinus, paranasal sinuses otherwise clear. Mastoid air cells are well aerated. CT CERVICAL SPINE FINDINGS Cervica straightening of normal cervical lordosis, no listhesis. Disc space narrowing at C4-C5 with associated endplate spurs. Endplate spurring at D34-534 and C6-C7 with preservation of disc space. Vertebral body heights are preserved. There is no fracture. The dens is intact. There are no jumped or perched facets. No evidence of large volume hemorrhage in the spinal canal. No prevertebral soft tissue edema. IMPRESSION: 1. No acute intracranial abnormality. No evidence of acute ischemia. 2. Mild degenerative change in the cervical spine. No acute abnormality. Electronically Signed   By: Jeb Levering M.D.   On: 02/14/2016 01:42   Ct Cervical Spine Wo Contrast  02/14/2016  CLINICAL DATA:  Post cardiac arrest requiring hypothermic protocol. Bilateral lower extremity weakness and paresthesia. Evaluate for CVA. EXAM: CT HEAD WITHOUT CONTRAST CT CERVICAL SPINE WITHOUT CONTRAST TECHNIQUE: Multidetector CT imaging of the head and cervical spine was performed following the standard protocol without intravenous contrast. Multiplanar CT image reconstructions of the cervical spine were also generated. COMPARISON:  Head CT 01/31/2016 FINDINGS: CT HEAD FINDINGS No intracranial hemorrhage, mass effect, or midline shift. No hydrocephalus. The basilar cisterns are patent. No evidence of territorial infarct. No intracranial fluid collection. Atherosclerosis of skullbase vasculature. Calvarium is intact. Mild mucosal thickening of left frontal sinus, paranasal sinuses otherwise clear. Mastoid air cells are well aerated. CT CERVICAL SPINE FINDINGS Cervica straightening of normal cervical lordosis, no listhesis. Disc space narrowing at C4-C5 with associated endplate spurs. Endplate spurring at D34-534 and C6-C7 with preservation of disc space. Vertebral body heights are preserved. There is no fracture. The dens is intact. There are no jumped or perched facets. No evidence of large volume hemorrhage in the spinal  canal. No prevertebral soft tissue edema. IMPRESSION: 1. No acute intracranial abnormality. No evidence of acute ischemia. 2. Mild degenerative change in the cervical spine. No acute abnormality. Electronically Signed   By: Jeb Levering M.D.   On: 02/14/2016 01:42   Ct Thoracic Spine Wo Contrast  02/14/2016  CLINICAL DATA:  Post cardiac arrest requiring hypothermic protocol. Subsequently developed bilateral retroperitoneal bleed and bilateral lower extremity weakness and paresthesias. EXAM: CT THORACIC SPINE WITHOUT CONTRAST TECHNIQUE: Multidetector CT imaging of the thoracic spine was performed without  intravenous contrast administration. Multiplanar CT image reconstructions were also generated. COMPARISON:  None. FINDINGS: Normal alignment. No compression deformity. Mild diffuse disc space narrowing throughout the thoracic spine. Anterior endplate spurring in the mid lower thoracic spine. No acute fracture. No bony spinal canal narrowing. No evidence of hemorrhage in the spinal canal. 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 No acute bony abnormality. No bony spinal canal narrowing. Electronically Signed   By: Jeb Levering M.D.   On: 02/14/2016 03:47   Mr Lumbar Spine Wo Contrast  02/11/2016  CLINICAL DATA:  65 year old male status post collapse and cardiac arrest. Abrupt drop in hemoglobin from 9 to and 7 with associated new severe low back pain radiating to the hips and down both lower extremities. Initial encounter. EXAM: MRI LUMBAR SPINE WITHOUT CONTRAST - LIMITED TECHNIQUE: Multiplanar, multisequence MR imaging of the lumbar spine was performed. No intravenous contrast was administered. COMPARISON:  Lumbar radiographs 02/07/2016 FINDINGS: I was contacted during the initial stages of attempted lumbar MRI by the technologist. This study was being attempted with the help of Anesthesia. The patient was to unstable for general anesthesia, and was not tolerating MRI well. The scout images demonstrate bilateral psoas muscle retroperitoneal hemorrhage (series 2, image 5 and image 20). No definite acute findings in the visible lumbar spine which is primarily seen sagittally on series 2, image 12. At 1024 hours I discussed the above with the Anesthesiologist. We agreed that the patient would be better served with CT Abdomen and Pelvis and decision was made to discontinue additional MRI imaging. IMPRESSION: Bilateral retroperitoneal hemorrhage involving the psoas musculature. Follow-up CT Abdomen and Pelvis could characterize further. Lumbar MRI discontinued prior to completion as  described above. This was discussed by telephone with Anesthesia at 1024 hours, and again with Dr. Dani Gobble Croitoru On 02/11/2016 at 1315 hours. Electronically Signed   By: Genevie Ann M.D.   On: 02/11/2016 13:27   Dg Chest Port 1 View  02/10/2016  CLINICAL DATA:  Shortness of breath and leukocytosis. EXAM: PORTABLE CHEST 1 VIEW COMPARISON:  02/07/2016 FINDINGS: Lungs are adequately inflated without consolidation or effusion. Mild stable cardiomegaly. Minimal degenerative change of the spine. IMPRESSION: No acute cardiopulmonary disease. Mild stable cardiomegaly. Electronically Signed   By: Marin Olp M.D.   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 He was on heparin for Atria fib, now D/c. His weakness is the same, not worse, not better, with stable Hgb since the 19th s/p 2 units of blood transfusion. 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  - Consider repeat Ct, check for progression in hematoma - Consider  surgical consult/evaluation - Physical therapy - Ck improving - Would defer MRI brain, as no symptoms referrable to CNS at this time. 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  Worsening systolic CHF and LBBB- EF- 30-35%, Per cards, possible CRT-D, Cath.\  Aortic valve regurg- per cards  Atria Fibrillation- Anticoag held, rate controlled  CKD- 4- Improving Cr, nephrology following.  Hypertension- Stable, slightly hypotensive.  Bing Neighbors, MD IMTS- resident [phone] 1:24 PM 02/14/2016

## 2016-02-14 NOTE — Progress Notes (Signed)
Physical Therapy Treatment Patient Details Name: Eric [name] MRN: [id number] DOB: [birth year] Today's Date: 02/14/2016    History of Present Illness 65 y.o. M with known sCHF and A.fib, presented 05/08 after out of hospital cardiac arrest. ROSC within 15-20 minutes after defib x 2. ETT 5/8-5/14. Now with acute encephalopathy with concern for anoxia per MD notes. 5/17 noted incr bil leg weakness and back pain; 5/19 MRI Bilateral retroperitoneal hemorrhage involving the psoas muscles; 5/22 neuro consult with ?need for surgery to decompress femoral nerves    PT Comments    Patient very motivated. LLE overall stronger than RLE (Rt knee extensors 0/5). Able to stand-pivot with use of Sara-Plus lift with good BP tolerance. Stood ~1 minute.  Follow Up Recommendations  CIR;Supervision/Assistance - 24 hour     Equipment Recommendations  Other (comment) (TBA)    Recommendations for Other Services       Precautions / Restrictions Precautions Precautions: Fall    Mobility  Bed Mobility Overal bed mobility: Needs Assistance;+2 for physical assistance Bed Mobility: Rolling;Sidelying to Sit Rolling: Min assist Sidelying to sit: Mod assist;+2 for physical assistance;+2 for safety/equipment       General bed mobility comments: assist to roll Lt (flexing RLE for pt); min assist to move legs over EOB; difficulty coordinating pushing on mattress and bedrail to raise torso  Transfers Overall transfer level: Needs assistance   Transfers: Stand Pivot Transfers   Stand pivot transfers:  (via sara-plus lift)       General transfer comment: pt with good use of Eric [name]  Ambulation/Gait                 Stairs            Wheelchair Mobility    Modified Rankin (Stroke Patients Only)       Balance   Sitting-balance support: No upper extremity supported;Feet supported Sitting balance-Leahy Scale: Fair     Standing balance support: Bilateral upper extremity  supported Standing balance-Leahy Scale: Zero Standing balance comment: using Sara-plus                    Cognition Arousal/Alertness: Awake/alert Behavior During Therapy: WFL for tasks assessed/performed Overall Cognitive Status: Within Functional Limits for tasks assessed                      Exercises General Exercises - Lower Extremity Ankle Circles/Pumps: AROM;Both;10 reps (Lt>Rt ROM) Long Arc Quad: AROM;Left;5 reps;Seated (pt unable to activate Rt quads with stimulation) Heel Slides: AAROM;Strengthening;5 reps;Supine (assisted flexion; resisted extension) Other Exercises Other Exercises: supine LLE in abdct, flexion position; assisted pt to straighten LLE and pt able to then activate bil hip internal rotators (Rt stronger than lt)    General Comments General comments (skin integrity, edema, etc.): Rt quads 0/5, knee flexors 2+, ankle DF3-; LLE knee extension 3+, knee flexion 2+, ankle DF 4/5      Pertinent Vitals/Pain Pain Assessment: Faces Faces Pain Scale: Hurts even more Pain Location: bil groin Pain Descriptors / Indicators: Grimacing;Pressure Pain Intervention(s): Limited activity within patient's tolerance;Monitored during session;Repositioned    Home Living                      Prior Function            PT Goals (current goals can now be found in the care plan section) Acute Rehab PT Goals Patient Stated Goal: to get better PT Goal Formulation: With patient Time  For Goal Achievement: 02/21/16 Potential to Achieve Goals: Good Progress towards PT goals: Goals downgraded-see care plan (due to bil retroperitoneal hematomas)    Frequency  Min 3X/week    PT Plan Current plan remains appropriate    Co-evaluation             End of Session Equipment Utilized During Treatment: Gait belt;Other (comment) (Sara-plus) Activity Tolerance: Patient tolerated treatment well Patient left: in chair;with call bell/phone within reach;with  chair alarm set     Time: [id number] PT Time Calculation (min) (ACUTE ONLY): 35 min  Charges:  $Therapeutic Exercise: 8-22 mins $Therapeutic Activity: 8-22 mins                    G Codes:      Eric [name] 2016-03-06, 3:30 PM  Pager [phone]

## 2016-02-14 NOTE — Progress Notes (Signed)
Patient ID: Eric [name], male   DOB: 1951-01-13, 65 y.o.   MRN: [id number]  [facility] Progress Note   Assessment/ Plan:   1. AKI on chronic kidney disease stage IV (baseline creatinine 1.8-2.0): Nonoliguric. Current labs reflect improvement/recovery of renal function back to baseline after what appears to have been renal insufficiency that was probably hemodynamically mediated with cardiac arrest and from rhabdomyolysis. 2. Rhabdomyolysis: CPK levels trending downwards-currently 2100 3. Cardiac arrest: Presumed V. tach/V. fib with V. fib seen on AED interrogation. Plans for coronary angiogram later this week after stabilization of renal function and with "contrast prophylaxis" maneuvers. 4. Chronic systolic heart failure: EF of 30-35%. Furosemide currently on hold. This may limit the aggressiveness of intravenous fluid therapy pre-and post coronary angiogram. 5. Hypertension: Blood pressure is elevated-monitor on current antihypertensive therapy for further adjustments.  Subjective:   He endorses some right thigh/back pain but otherwise feeling fair. Denies significant chest pain or shortness of breath.    Objective:   BP 146/85 mmHg  Pulse 81  Temp(Src) 98.4 F (36.9 C) (Oral)  Resp 15  Ht 5\' 9"  (1.753 m)  Wt 110.5 kg (243 lb 9.7 oz)  BMI 35.96 kg/m2  SpO2 97%  Intake/Output Summary (Last 24 hours) at 02/14/16 0943 Last data filed at 02/14/16 0900  Gross per 24 hour  Intake   1317 ml  Output   1625 ml  Net   -308 ml   Weight change: -2.899 kg (-6 lb 6.3 oz)  Physical Exam: [id number] resting in bed CVS: Pulse regular rhythm, normal rate, S1 and S2 normal. Resp: Clear to auscultation bilaterally-no rales/rhonchi Abd: Soft, flat, nontender Ext: Without lower extremity edema  Imaging: Ct Abdomen Pelvis Wo Contrast  02/14/2016  CLINICAL DATA:  Status post cardiac arrest, with bilateral retroperitoneal bleeds and lower extremity weakness and  paresthesia. Lower back pain. Patient on Eliquis. EXAM: CT ABDOMEN AND PELVIS WITHOUT CONTRAST TECHNIQUE: Multidetector CT imaging of the abdomen and pelvis was performed following the standard protocol without IV contrast. COMPARISON:  MRI of the lumbar spine performed 02/11/2016 FINDINGS: Minimal bibasilar atelectasis is noted. The liver and spleen are unremarkable in appearance. The gallbladder is within normal limits. The pancreas and adrenal glands are unremarkable. Nonspecific perinephric stranding is noted bilaterally. A 1.5 cm cyst is noted at the lower pole of the right kidney. The kidneys are otherwise unremarkable. There is no evidence of hydronephrosis. No renal or ureteral stones are identified. No free fluid is identified. The small bowel is unremarkable in appearance. The stomach is within normal limits. No acute vascular abnormalities are seen. There is mild ectasia of the distal abdominal aorta and common iliac arteries bilaterally, without evidence of significant aneurysmal dilatation. The appendix is normal in caliber and contains contrast, without evidence of appendicitis. The colon is grossly unremarkable in appearance. The bladder is mildly distended and grossly unremarkable. The prostate is borderline normal in size. No inguinal lymphadenopathy is seen. Hemorrhage is noted along the psoas musculature bilaterally, and along the iliacus musculature, with overlying hemorrhage, more prominent on the left. Mild hemorrhage is also seen tracking into the proximal lower extremities bilaterally. Mild soft tissue inflammation is noted overlying the psoas musculature. No acute osseous abnormalities are identified. Facet disease is noted at the lower lumbar spine. IMPRESSION: 1. Hemorrhage along the psoas musculature bilaterally, and along the iliacus musculature, with overlying hemorrhage, more prominent on the left. Mild hemorrhage tracking into the proximal lower extremities bilaterally. Mild soft  tissue inflammation  overlying the psoas musculature. 2. Small left renal cyst noted. 3. Mild ectasia of the distal abdominal aorta and common iliac arteries bilaterally, without significant aneurysmal dilatation. Electronically Signed   By: Garald Balding M.D.   On: 02/14/2016 04:07   Ct Head Wo Contrast  02/14/2016  CLINICAL DATA:  Post cardiac arrest requiring hypothermic protocol. Bilateral lower extremity weakness and paresthesia. Evaluate for CVA. EXAM: CT HEAD WITHOUT CONTRAST CT CERVICAL SPINE WITHOUT CONTRAST TECHNIQUE: Multidetector CT imaging of the head and cervical spine was performed following the standard protocol without intravenous contrast. Multiplanar CT image reconstructions of the cervical spine were also generated. COMPARISON:  Head CT 01/31/2016 FINDINGS: CT HEAD FINDINGS No intracranial hemorrhage, mass effect, or midline shift. No hydrocephalus. The basilar cisterns are patent. No evidence of territorial infarct. No intracranial fluid collection. Atherosclerosis of skullbase vasculature. Calvarium is intact. Mild mucosal thickening of left frontal sinus, paranasal sinuses otherwise clear. Mastoid air cells are well aerated. CT CERVICAL SPINE FINDINGS Cervica straightening of normal cervical lordosis, no listhesis. Disc space narrowing at C4-C5 with associated endplate spurs. Endplate spurring at D34-534 and C6-C7 with preservation of disc space. Vertebral body heights are preserved. There is no fracture. The dens is intact. There are no jumped or perched facets. No evidence of large volume hemorrhage in the spinal canal. No prevertebral soft tissue edema. IMPRESSION: 1. No acute intracranial abnormality. No evidence of acute ischemia. 2. Mild degenerative change in the cervical spine. No acute abnormality. Electronically Signed   By: Jeb Levering M.D.   On: 02/14/2016 01:42   Ct Cervical Spine Wo Contrast  02/14/2016  CLINICAL DATA:  Post cardiac arrest requiring hypothermic protocol.  Bilateral lower extremity weakness and paresthesia. Evaluate for CVA. EXAM: CT HEAD WITHOUT CONTRAST CT CERVICAL SPINE WITHOUT CONTRAST TECHNIQUE: Multidetector CT imaging of the head and cervical spine was performed following the standard protocol without intravenous contrast. Multiplanar CT image reconstructions of the cervical spine were also generated. COMPARISON:  Head CT 01/31/2016 FINDINGS: CT HEAD FINDINGS No intracranial hemorrhage, mass effect, or midline shift. No hydrocephalus. The basilar cisterns are patent. No evidence of territorial infarct. No intracranial fluid collection. Atherosclerosis of skullbase vasculature. Calvarium is intact. Mild mucosal thickening of left frontal sinus, paranasal sinuses otherwise clear. Mastoid air cells are well aerated. CT CERVICAL SPINE FINDINGS Cervica straightening of normal cervical lordosis, no listhesis. Disc space narrowing at C4-C5 with associated endplate spurs. Endplate spurring at D34-534 and C6-C7 with preservation of disc space. Vertebral body heights are preserved. There is no fracture. The dens is intact. There are no jumped or perched facets. No evidence of large volume hemorrhage in the spinal canal. No prevertebral soft tissue edema. IMPRESSION: 1. No acute intracranial abnormality. No evidence of acute ischemia. 2. Mild degenerative change in the cervical spine. No acute abnormality. Electronically Signed   By: Jeb Levering M.D.   On: 02/14/2016 01:42   Ct Thoracic Spine Wo Contrast  02/14/2016  CLINICAL DATA:  Post cardiac arrest requiring hypothermic protocol. Subsequently developed bilateral retroperitoneal bleed and bilateral lower extremity weakness and paresthesias. EXAM: CT THORACIC SPINE WITHOUT CONTRAST TECHNIQUE: Multidetector CT imaging of the thoracic spine was performed without intravenous contrast administration. Multiplanar CT image reconstructions were also generated. COMPARISON:  None. FINDINGS: Normal alignment. No compression  deformity. Mild diffuse disc space narrowing throughout the thoracic spine. Anterior endplate spurring in the mid lower thoracic spine. No acute fracture. No bony spinal canal narrowing. No evidence of hemorrhage in the spinal canal. 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 No acute bony abnormality. No bony spinal canal narrowing. Electronically Signed   By: Jeb Levering M.D.   On: 02/14/2016 03:47    Labs: BMET  Recent Labs Lab 02/08/16 0418 02/09/16 0723 02/10/16 0250 02/11/16 0406 02/12/16 0428 02/13/16 0322 02/14/16 0310  NA 144 146* 144 144 145 143 143  K 3.9 4.4 4.2 3.9 4.1 4.0 4.1  CL 107 108 109 107 105 107 112*  CO2 25 22 21* 25 27 27 26   GLUCOSE 147* 138* 121* 160* 166* 179* 141*  BUN 58* 81* 98* 116* 100* 72* 51*  CREATININE 2.34* 2.87* 3.15* 3.53* 2.80* 2.22* 1.83*  CALCIUM 9.2 9.1 8.6* 8.3* 8.2* 8.3* 8.3*  PHOS  --  4.9* 5.5*  --   --   --   --    CBC  Recent Labs Lab 02/11/16 1930  02/12/16 0428  02/13/16 0322 02/13/16 0653 02/13/16 1200 02/14/16 0310  WBC 24.3*  --  23.3*  --  22.1*  --   --  20.4*  NEUTROABS 19.7*  --  17.9*  --  17.3*  --   --  15.7*  HGB 9.1*  < > 8.6*  < > 8.6* 8.7* 8.8* 8.7*  HCT 27.8*  < > 26.3*  < > 27.4* 27.5* 28.0* 27.4*  MCV 90.6  --  91.0  --  94.2  --   --  95.5  PLT 294  --  277  --  262  --   --  229  < > = values in this interval not displayed.  Medications:    . amiodarone  400 mg Oral Daily  . carvedilol  37.5 mg Oral BID WC  . docusate sodium  100 mg Oral BID  . feeding supplement (ENSURE ENLIVE)  237 mL Oral BID BM  . insulin aspart  0-5 Units Subcutaneous QHS  . insulin aspart  0-9 Units Subcutaneous TID WC  . isosorbide-hydrALAZINE  0.5 tablet Oral TID  . levothyroxine  75 mcg Oral QAC breakfast  . polyethylene glycol  17 g Oral Daily   Elmarie Shiley, MD 02/14/2016, 9:43 AM

## 2016-02-14 NOTE — Progress Notes (Signed)
Speech Language Pathology Treatment: Dysphagia  Patient Details Name: Eric [name] MRN: [id number] DOB: 22-Oct-1950 Today's Date: 02/14/2016 Time: 1200-1210 SLP Time Calculation (min) (ACUTE ONLY): 10 min  Assessment / Plan / Recommendation Clinical Impression  Pt with resolved dysphonia given time since extubation.  Voice is strong with good quality and volume.  Trials of thin liquid at bedside with regular solids tolerated well; no overt s/s of aspiration.  Airway protection has likely improved as laryngeal function has normalized.  Recommend advancing diet to regular solids, thin liquids.  No further SLP f/u is warranted - our services will sign off.    HPI [name] with known sCHF and A.fib, presented 05/08 after out of hospital cardiac arrest. ROSC within 15-20 minutes after defib x 2. ETT 5/8-5/14. Now with acute encephalopathy with concern for anoxia per MD notes. Notable dysphagia s/p extubation. MBS 5/17 with trace silent aspiration thin liquids; now with possible rhabdo.        SLP Plan  All goals met     Recommendations  Diet recommendations: Regular;Thin liquid Liquids provided via: Cup Medication Administration: Whole meds with puree Supervision: Patient able to self feed Compensations: Slow rate;Small sips/bites Postural Changes and/or Swallow Maneuvers: Seated upright 90 degrees             Plan: All goals met     GO              Eric [name] L. Tivis Ringer, [place] CCC/SLP Pager [phone]   Eric [name] 02/14/2016, 12:13 PM

## 2016-02-14 NOTE — Progress Notes (Signed)
Patient Name: Eric [name] Date of Encounter: 02/14/2016  Principal Problem:   Cardiac arrest [facility]) Active Problems:   Chronic systolic heart failure ([place])   Hyperlipidemia   Left bundle branch block   Obesity (BMI 30-39.9)   Atrial flutter ([place])   Acute respiratory failure ([place])   Encounter for central line placement   Arrhythmia   Altered mental status   HCAP (healthcare-associated pneumonia)   AKI (acute kidney injury) ([place])   Ventricular fibrillation ([facility])   Aortic aneurysm without rupture ([facility])   Aortic insufficiency   Back pain   Leg weakness, bilateral   Paroxysmal atrial fibrillation ([facility])   Pneumonia   Acute lumbar back pain   Non-traumatic rhabdomyolysis   Retroperitoneal bleed   Weakness of both lower extremities   Lower extremity weakness   Length of Stay: 13  SUBJECTIVE Feeling better.  Back pain stable. Still has some weakness in right leg related to rhabdo/ bleeding around iliopsoas muscle.No CP.    Tele: NSR HR 84   CURRENT MEDS . amiodarone  400 mg Oral Daily  . carvedilol  37.5 mg Oral BID WC  . docusate sodium  100 mg Oral BID  . feeding supplement (ENSURE ENLIVE)  237 mL Oral BID BM  . insulin aspart  0-5 Units Subcutaneous QHS  . insulin aspart  0-9 Units Subcutaneous TID WC  . isosorbide-hydrALAZINE  0.5 tablet Oral TID  . levothyroxine  75 mcg Oral QAC breakfast  . polyethylene glycol  17 g Oral Daily    OBJECTIVE   Intake/Output Summary (Last 24 hours) at 02/14/16 0807 Last data filed at 02/14/16 0653  Gross per 24 hour  Intake   1197 ml  Output   1350 ml  Net   -153 ml   Filed Weights   02/12/16 0311 02/13/16 0427 02/14/16 0318  Weight: 113 kg (249 lb 1.9 oz) 113.399 kg (250 lb) 110.5 kg (243 lb 9.7 oz)    PHYSICAL EXAM Filed Vitals:   02/13/16 2305 02/14/16 0039 02/14/16 0318 02/14/16 0732  BP: 99/53 115/70 119/70 146/85  Pulse: 84 82 84   Temp: 98.5 F (36.9 C)  98.5 F (36.9 C) 98.4 F (36.9 C)  TempSrc: Oral   Oral Oral  Resp: 13 13 15 19   Height:      Weight:   110.5 kg (243 lb 9.7 oz)   SpO2: 100% 100% 98% 97%   General: Alert, oriented x3, no distress Head: EOMI, moist mucous membranes Pulm: clear to auscultation, no crackles or rhonchi Cardiovascular: RRR, no murmurs or gallops Abdomen: no tenderness or distention, no masses by palpation, no abnormal pulsatility or arterial bruits, normal bowel sounds, no hepatosplenomegaly Extremities: no clubbing, cyanosis or edema; Neurological: CN 2-12 grossly intact Skin: warm and dry  LABS  CBC  Recent Labs  02/13/16 0322  02/13/16 1200 02/14/16 0310  WBC 22.1*  --   --  20.4*  NEUTROABS 17.3*  --   --  15.7*  HGB 8.6*  < > 8.8* 8.7*  HCT 27.4*  < > 28.0* 27.4*  MCV 94.2  --   --  95.5  PLT 262  --   --  229  < > = values in this interval not displayed. Basic Metabolic Panel  Recent Labs  02/13/16 0322 02/14/16 0310  NA 143 143  K 4.0 4.1  CL 107 112*  CO2 27 26  GLUCOSE 179* 141*  BUN 72* 51*  CREATININE 2.22* 1.83*  CALCIUM 8.3* 8.3*  MG 3.2* 2.7*   Liver Function Tests No results for input(s): AST, ALT, ALKPHOS, BILITOT, PROT, ALBUMIN in the last 72 hours. No results for input(s): LIPASE, AMYLASE in the last 72 hours. Cardiac Enzymes  Recent Labs  02/12/16 2228 02/13/16 0322 02/14/16 0310  CKTOTAL 3419* 3442* 2121*    Radiology Studies CT ABDOMEN AND PELVIS WITHOUT CONTRAST  TECHNIQUE: Multidetector CT imaging of the abdomen and pelvis was performed following the standard protocol without IV contrast.  COMPARISON: MRI of the lumbar spine performed 02/11/2016  FINDINGS: Minimal bibasilar atelectasis is noted.  The liver and spleen are unremarkable in appearance. The gallbladder is within normal limits. The pancreas and adrenal glands are unremarkable.  Nonspecific perinephric stranding is noted bilaterally. A 1.5 cm cyst is noted at the lower pole of the right kidney. The kidneys are otherwise  unremarkable. There is no evidence of hydronephrosis. No renal or ureteral stones are identified.  No free fluid is identified. The small bowel is unremarkable in appearance. The stomach is within normal limits. No acute vascular abnormalities are seen. There is mild ectasia of the distal abdominal aorta and common iliac arteries bilaterally, without evidence of significant aneurysmal dilatation.  The appendix is normal in caliber and contains contrast, without evidence of appendicitis. The colon is grossly unremarkable in appearance.  The bladder is mildly distended and grossly unremarkable. The prostate is borderline normal in size. No inguinal lymphadenopathy is seen.  Hemorrhage is noted along the psoas musculature bilaterally, and along the iliacus musculature, with overlying hemorrhage, more prominent on the left. Mild hemorrhage is also seen tracking into the proximal lower extremities bilaterally. Mild soft tissue inflammation is noted overlying the psoas musculature.  No acute osseous abnormalities are identified. Facet disease is noted at the lower lumbar spine.  IMPRESSION: 1. Hemorrhage along the psoas musculature bilaterally, and along the iliacus musculature, with overlying hemorrhage, more prominent on the left. Mild hemorrhage tracking into the proximal lower extremities bilaterally. Mild soft tissue inflammation overlying the psoas musculature. 2. Small left renal cyst noted. 3. Mild ectasia of the distal abdominal aorta and common iliac arteries bilaterally, without significant aneurysmal dilatation.   Electronically Signed  By: Garald Balding M.D.  On: 02/14/2016 04:07     ASSESSMENT AND PLAN  1. S/P cardiac arrest, presumed VT/VF VF on AED interrogation. Plan for CRT-D on this admission, secondary prevention. Normal electrolytes on admission. Moderate to severely depressed LVEF despite chronic comprehensive medical treatment. - will need  cardiac cath  once renal function improved. Creat trending down. Still has CK elevation. Hopefully aim for later this week.   2. Chronic systolic heart failure-TTE 5/17 reveals EF of 99991111, grade 2 diastolic dysfunction. Holding lasix due to AKI. Volume status looks normal. Continue carvedilol and Bidil. Not a candidate for ACEi/ARB due to AKI.  3. 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 TEE 02/08/16 revealed moderate aortic regurgitation with eccentric jet. Aortic root 5.2cm. Will need to follow closely. Consider CT in 6 months.  4. Atrial fibrillation-- now in sinus rhythm. On home coreg 25mg  BID. Off anticoagulants due to retroperitoneal bleeding.  Reduce  amiodarone po 400mg  daily.   5. Hypertension- controlled, continue coreg, resumed bidil 20-37.5mg  yesterday. BP runs low at times- will not increase dose yet.  6. Aortic root enlargement - aortic root measures 1mm on TEE -May benefit CT Angio when renal function is clearly stabilized.  7. Acute renal failure on CKD (chronic kidney disease), stage III-IV 2/2  rhabdo - renal u/s wnl. CK elevated. Likely rhabdo cause of renal failure, nephro following. Thankfully, creatinine is improving now 1.8. Back pain and muscle weakness could be due to rhabo as well, ? related to retroperitoneal bleed causing irritation around psoas muscles. Will need PT.     8. Left bundle branch block typical LBBB with QRS duration 170ms predicts response to CRT.  9. Hyperlipidemia - stopped statin due to rhabdo- will resume when rhabdo resolves.   10. Possible aspiration pneumonia- completed course of abx. CXR 5/18 neg for infection.   11. Leukocytosis -- WBC remains elevated but is improving. UA negative. Pt has been afebrile, blood cultures repeated 5/18 NGTD, urine culture 5/18 1000 colonies, insignificant growth, blood cutlures from 5/12 NGTD. His portable CXR was neg for PNA. Possible diskitis or spinal  source,  lumbar MRI was attempted however upon initial images, retroperitoneal hemorrhage was discovered.   12. Anemia-- hgb 7.1 previously from 9.5 secondary to retroperitoneal bleed, discovered by MRI back.  - Status post 2 units of blood with diuretics  - Hemoglobin 8.7 currently, stable posttransfusion  - Continue to hold heparin IV (was placed in the setting of cardioversion)  - CT last night stable.    Emilyann Banka [place], MD   02/14/2016 8:07 AM

## 2016-02-14 NOTE — Clinical Social Work Note (Signed)
CSW continuing to follow patient's progress in case he needs SNF as a backup plan to CIR inpatient rehab.  Jones Broom. Se[place], MSW, [place] 02/14/2016 6:29 PM

## 2016-02-14 NOTE — Progress Notes (Signed)
I note Neurology is recommendation consideration of surgical consultation for possble decompression of femoral nerve.  Work up is ongoing.  Will follow along for possible CIR when medically stable.    [place] Admissions Coordinator Cell [phone] Office [phone]

## 2016-02-14 NOTE — Progress Notes (Signed)
Inpatient Diabetes Program Recommendations  AACE/ADA: New Consensus Statement on Inpatient Glycemic Control (2015)  Target Ranges:  Prepandial:   less than 140 mg/dL      Peak postprandial:   less than 180 mg/dL (1-2 hours)      Critically ill patients:  140 - 180 mg/dL   Review of Glycemic Control  Diabetes history: None Outpatient Diabetes medications: None Current orders for Inpatient glycemic control: Novolog sensitive tidwc and hs  Results for ASHTEN, RAPKIN (MRN [id number]) as of 02/14/2016 16:54  Ref. Range 02/10/2016 15:47  Hemoglobin A1C Latest Ref Range: 4.8-5.6 % 7.1 (H)  Results for KAYN, GMEREK (MRN [id number]) as of 02/14/2016 16:54  Ref. Range 02/13/2016 16:46 02/13/2016 21:56 02/14/2016 07:35 02/14/2016 11:07 02/14/2016 16:36  Glucose-Capillary Latest Ref Range: 65-99 mg/dL 176 (H) 207 (H) 131 (H) 172 (H) 195 (H)   ? New diagnosis DM with HgbA1C > 6.5%.  Inpatient Diabetes Program Recommendations:    Will order Living Well With Diabetes book and talk with pt about diabetes diagnosis when MD has spoken to pt. Agree with Novolog sensitive tidwc and hs.  Will follow. Thank you. Lorenda Peck, RD, LDN, CDE Inpatient Diabetes Coordinator [phone]

## 2016-02-15 ENCOUNTER — Encounter (HOSPITAL_COMMUNITY): Payer: Self-pay | Admitting: Radiology

## 2016-02-15 DIAGNOSIS — I469 Cardiac arrest, cause unspecified: Secondary | ICD-10-CM | POA: Diagnosis not present

## 2016-02-15 DIAGNOSIS — I4901 Ventricular fibrillation: Secondary | ICD-10-CM | POA: Diagnosis not present

## 2016-02-15 DIAGNOSIS — I714 Abdominal aortic aneurysm, without rupture: Secondary | ICD-10-CM | POA: Diagnosis not present

## 2016-02-15 DIAGNOSIS — I712 Thoracic aortic aneurysm, without rupture: Secondary | ICD-10-CM | POA: Diagnosis not present

## 2016-02-15 DIAGNOSIS — I483 Typical atrial flutter: Secondary | ICD-10-CM

## 2016-02-15 DIAGNOSIS — M545 Low back pain: Secondary | ICD-10-CM | POA: Diagnosis not present

## 2016-02-15 DIAGNOSIS — N179 Acute kidney failure, unspecified: Secondary | ICD-10-CM | POA: Diagnosis not present

## 2016-02-15 LAB — GLUCOSE, CAPILLARY
GLUCOSE-CAPILLARY: 161 mg/dL — AB (ref 65–99)
GLUCOSE-CAPILLARY: 139 mg/dL — AB (ref 65–99)
Glucose-Capillary: 146 mg/dL — ABNORMAL HIGH (ref 65–99)
Glucose-Capillary: 169 mg/dL — ABNORMAL HIGH (ref 65–99)

## 2016-02-15 LAB — CBC
HCT: 28.4 % — ABNORMAL LOW (ref 39.0–52.0)
Hemoglobin: 9 g/dL — ABNORMAL LOW (ref 13.0–17.0)
MCH: 29.7 pg (ref 26.0–34.0)
MCHC: 31.7 g/dL (ref 30.0–36.0)
MCV: 93.7 fL (ref 78.0–100.0)
PLATELETS: 213 10*3/uL (ref 150–400)
RBC: 3.03 MIL/uL — AB (ref 4.22–5.81)
RDW: 15.3 % (ref 11.5–15.5)
WBC: 17.3 10*3/uL — AB (ref 4.0–10.5)

## 2016-02-15 LAB — BASIC METABOLIC PANEL
ANION GAP: 7 (ref 5–15)
BUN: 42 mg/dL — ABNORMAL HIGH (ref 6–20)
CALCIUM: 8.3 mg/dL — AB (ref 8.9–10.3)
CHLORIDE: 108 mmol/L (ref 101–111)
CO2: 22 mmol/L (ref 22–32)
Creatinine, Ser: 1.05 mg/dL (ref 0.61–1.24)
GFR calc non Af Amer: >60 mL/min (ref >60)
Glucose, Bld: 155 mg/dL — ABNORMAL HIGH (ref 65–99)
Potassium: 4.1 mmol/L (ref 3.5–5.1)
Sodium: 137 mmol/L (ref 135–145)

## 2016-02-15 LAB — MAGNESIUM: Magnesium: 2.3 mg/dL (ref 1.7–2.4)

## 2016-02-15 LAB — CULTURE, BLOOD (ROUTINE X 2)
Culture: NO GROWTH
Culture: NO GROWTH

## 2016-02-15 LAB — CK: CK TOTAL: 1474 U/L — AB (ref 49–397)

## 2016-02-15 MED FILL — Fentanyl Citrate Preservative Free (PF) Inj 100 MCG/2ML: INTRAMUSCULAR | Qty: 2 | Status: AC

## 2016-02-15 MED FILL — Dexmedetomidine HCl IV Soln 200 MCG/2ML: INTRAVENOUS | Qty: 1 | Status: AC

## 2016-02-15 MED FILL — Sodium Chloride IV Soln 0.9%: INTRAVENOUS | Qty: 500 | Status: AC

## 2016-02-15 MED FILL — Midazolam HCl Inj 2 MG/2ML (Base Equivalent): INTRAMUSCULAR | Qty: 1 | Status: AC

## 2016-02-15 NOTE — Progress Notes (Signed)
Visited pt. at the bedside.  Pt. not medically ready for CIR at this time.  Will continue to follow along for progress, medical stability, need for CIR, insurance authorization and bed availability when pt. pt. becomes medically ready.    [place] Admissions Coordinator Cell [phone] Office [phone]

## 2016-02-15 NOTE — Progress Notes (Signed)
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   He was in his USOH until evening of 05/08 when he was with his wife at [place]. As they were leaving [employer], he began driving the car and while still in the parking lot, he began to breath very heavily before becoming unresponsive. His wife immediately got the car stopped, got him out of the car and began CPR while calling EMS. She feels that EMS arrived within 5 - 10 minutes and upon their arrival, he received 2 defibrillations via AED. ROSC was achieved within roughly 15 - 20 minutes.  Upon ED arrival, he remained unresponsive and was subsequently intubated. PCCM was called for admission and consideration of hypothermia protocol. Cardiology was also called in consultation.  Per his wife, he had not had any complaints recently and had behaved completely normal. 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   Assessment & Plan:   Principal Problem:   Cardiac arrest [facility]) Active Problems:   Chronic systolic heart failure (HCC)   Hyperlipidemia   Left bundle branch block   Obesity (BMI 30-39.9)   Atrial flutter ([place])   Acute respiratory failure ([place])   Encounter for central line placement   Arrhythmia   Altered mental status   HCAP (healthcare-associated pneumonia)   AKI (acute kidney injury) ([place])   Ventricular fibrillation (HCC)   Aortic aneurysm without rupture [facility])   Aortic insufficiency   Back pain   Leg weakness, bilateral   Paroxysmal atrial fibrillation (HCC)   Pneumonia   Acute lumbar back pain   Non-traumatic rhabdomyolysis   Retroperitoneal bleed   Weakness of both lower extremities   Lower extremity weakness   Femoral  neuropathy  V. fib Cardiac arrest  - CPR performed by wife, multiple shocks -Hypothermic protocol performed -Stable  Chronic systolic CHF  -(EF [id number] per echo from April 2017) -Strict I and O's since admission + 4ml -Daily weight  Filed Weights   02/13/16 0427 02/14/16 0318 02/15/16 0316  Weight: 113.399 kg (250 lb) 110.5 kg (243 lb 9.7 oz) 113.5 kg (250 lb 3.6 oz)  -Coreg 37.5 mg BID -BiDil 20-30 7.5 mg 0.5 tablet TID -Transfuse for hemoglobin<7 -5/19 transfuse 2 units PRBC -Cardiology plans heart catheterization later this week.  Aortic aneurysm -4.9 cm aortic aneurysm -- will need f/u CT of chest once extubated and Cr improved   HTN  -Stable -See CHF  Paroxysmal A.fib  -See CHF -Hold all anticoagulants secondary to acute blood loss anemia  Aspiration Pneumonia positive CITROBACTER KOSERI -Completed 7 day course antibiotics  -Patient with continued leukocytosis, however afebrile. Improving  Acute encephalopathy  -Resolved  New-onset bilateral LE weakness and numbness -MRI L-spine; bilateral retroperitoneal bleed see results below   -Patient continues to have SSx although on physical exam pain in L-spine region is almost fully resolved. SSx  improving (pressure on spine/lumbar nerve plexus). -[facility]; Believes all neurologic deficit secondary to large bilateral retroperitoneal bleed. Negative CVA, cord compression by CT  Acute on chronic renal failure (baselineCr~1.9)  Lab Results  Component Value Date   CREATININE 1.05 02/15/2016   CREATININE 1.83* 02/14/2016   CREATININE 2.22* 02/13/2016   Hyperkalemia -resolved  Rhabdomyolysis -Atorvastatin stopped 5/17 -Patient's CK  climbing and after 10 days cannot blame V. fib arrest. 5/19 CK continues to climb. -Repeat CK q 6hr -CK appears to have peaked at 7202 and is now trending down -Sodium Bicarb 75 ml/hr; Held by nephrology, secondary patient obtaining 2 units RBC   Nutrition -Dysphagia 3  nectar thick  Hypothyroidism. -Synthroid 75 g daily  -TSH pending  Acute blood loss anemia/Bilateral Retroperitoneal Bleed Recent Labs  Recent Labs Lab 02/13/16 0322 02/13/16 0653 02/13/16 1200 02/14/16 0310 02/15/16 0222  HGB 8.6* 8.7* 8.8* 8.7* 9.0*  -5/19 transfuse 2 units PRBC -Hold any anticoagulant -Patient not able to have abdominal/pelvic CT with contrast secondary to renal function -H/H stable continue to monitor closely      DVT prophylaxis: SCD Code Status: Full Family Communication: None available Disposition Plan: Resolution rhabdomyolysis, workup for lower extremity weakness   Consultants:  Stefano Gaul nephrology Dr.Nandigam neurology   Procedures/Significant Events:  05/08 - admitted after cardiac arrest. Started on TTM - 33 degrees C CXR 05/08 >>no acute process. CT head 05/08 >>no acute process Echo 5/9>>EF 30 to 35%, severe aortic regurgitation EEG 5/9>>no focal, hemispheric, or lateralizing features; demonstrated a burst/suppression pattern, which can be consistent with a sedated EEG 5/11>>mod diffuse slowing, No epileptiform activity was recorded. 5/10-pt rewarmed per hypothermia protocol 5/14 >> Extubated Hip spine films 5/15 > no acute abnormality 5/16 TEE;LVEF= 30%- 35%. Diffuse hypokinesis. - Aortic valve: Moderate regurgitation.- Left atrium: Moderately dilated.  5/19 MRI L-spine; Bilateral retroperitoneal hemorrhage involving the psoas musculature. Follow-up CT Abdomen and Pelvis could characterize further. 5/21 CT head/C-spine; negative acute infarct-C-spine DJD 5/21 CT abdomen pelvis WO contrast;-Hemorrhage along the psoas musculature bilaterally, and along the iliacus musculature, with overlying hemorrhage, Lt>>Rt.- Mild hemorrhage tracking proximal lower extremities bilaterally.- Mild soft tissue inflammation overlying the psoas musculature. - Mild ectasia of the distal abdominal aorta and common iliac arteries bilaterally,  without significant aneurysmal dilatation. 5/21 CT T-spine WO contrast; DJD   Cultures Blood 05/08 >>no growth 5/11>> Urine 05/08 >>no growth 5/10 Sputum 05/08 >>few GPC, few GNR>>> 5/12 sputum positive CITROBACTER KOSERI MRSA PCR 5/9>>Negative BCx2 5/12>>> 5/18 blood pending 5/18 urine pending  Antimicrobials: Zosyn 5/11>> 5/16 Vancomycin 5/11>> 5/14 Ceftriaxone 5/16 >> 5/18   Devices    LINES / TUBES:      Continuous Infusions: . sodium chloride 10 mL/hr at 02/11/16 1757     Subjective: 5/23 A/O 4, NAD. Patient still reports bilateral lower extremity weakness/paresthesia anterior thighs, able to sit on the edge of the bed and eat meals.   Objective: Filed Vitals:   02/15/16 0316 02/15/16 0317 02/15/16 0400 02/15/16 0738  BP:  133/79  147/81  Pulse:  99 86 93  Temp:  98 F (36.7 C)  98.5 F (36.9 C)  TempSrc:  Oral  Oral  Resp:  16 22 19   Height:      Weight: 113.5 kg (250 lb 3.6 oz)     SpO2:  99% 99% 99%    Intake/Output Summary (Last 24 hours) at 02/15/16 0909 Last data filed at 02/15/16 0900  Gross per 24 hour  Intake   1060 ml  Output   1175 ml  Net   -115 ml   Filed Weights   02/13/16 0427 02/14/16 0318 02/15/16 0316  Weight: 113.399 kg (250 lb) 110.5 kg (243 lb 9.7 oz) 113.5 kg (250 lb 3.6 oz)    Examination:  General: A/O 4, NAD,Sitting on the edge of the bed No acute respiratory distress Eyes: negative scleral  hemorrhage, negative anisocoria, negative icterus ENT: Negative Runny nose, negative gingival bleeding, Neck:  Negative scars, masses, torticollis, lymphadenopathy, JVD Lungs: Clear to auscultation bilaterally without wheezes or crackles Cardiovascular: Regular rate and rhythm without murmur gallop or rub normal S1 and S2 Abdomen: negative abdominal pain, nondistended, positive soft, bowel sounds, no rebound, no ascites, no appreciable mass Extremities: No significant cyanosis, clubbing, or edema bilateral lower  extremities Skin: Negative rashes, lesions, ulcers Psychiatric:  Negative depression, negative anxiety, negative fatigue, negative mania Musculoskeletal; pain to palpation midline L2-S1, pain palpation left lumbosacral area.  Central nervous system:  Cranial nerves II through XII intact, tongue/uvula midline, upper extremities muscle strength 5/5, Bilat lower extremity strength 3/5, however able to extend right knee, sensation Decreased bilateral anterior thighs region,  can now tolerate passive elevation of bilateral legs, negative dysarthria, negative expressive aphasia, negative receptive aphasia.  .     Data Reviewed: Care during the described time interval was provided by me .  I have reviewed this patient's available data, including medical history, events of note, physical examination, and all test results as part of my evaluation. I have personally reviewed and interpreted all radiology studies.  CBC:  Recent Labs Lab 02/11/16 0406  02/11/16 1930  02/12/16 0428  02/13/16 0322 02/13/16 0653 02/13/16 1200 02/14/16 0310 02/15/16 0222  WBC 26.6*  < > 24.3*  --  23.3*  --  22.1*  --   --  20.4* 17.3*  NEUTROABS 20.7*  --  19.7*  --  17.9*  --  17.3*  --   --  15.7*  --   HGB 7.1*  < > 9.1*  < > 8.6*  < > 8.6* 8.7* 8.8* 8.7* 9.0*  HCT 21.9*  < > 27.8*  < > 26.3*  < > 27.4* 27.5* 28.0* 27.4* 28.4*  MCV 95.2  < > 90.6  --  91.0  --  94.2  --   --  95.5 93.7  PLT 279  < > 294  --  277  --  262  --   --  229 213  < > = values in this interval not displayed. Basic Metabolic Panel:  Recent Labs Lab 02/09/16 0723 02/10/16 0250 02/11/16 0406 02/12/16 0428 02/13/16 0322 02/14/16 0310 02/15/16 0222  NA 146* 144 144 145 143 143 137  K 4.4 4.2 3.9 4.1 4.0 4.1 4.1  CL 108 109 107 105 107 112* 108  CO2 22 21* 25 27 27 26 22   GLUCOSE 138* 121* 160* 166* 179* 141* 155*  BUN 81* 98* 116* 100* 72* 51* 42*  CREATININE 2.87* 3.15* 3.53* 2.80* 2.22* 1.83* 1.05  CALCIUM 9.1 8.6* 8.3*  8.2* 8.3* 8.3* 8.3*  MG 2.9* 2.8* 3.1* 3.0* 3.2* 2.7* 2.3  PHOS 4.9* 5.5*  --   --   --  2.3*  --    GFR: Estimated Creatinine Clearance: 88.3 mL/min (by C-G formula based on Cr of 1.05). Liver Function Tests:  Recent Labs Lab 02/10/16 0827  AST 164*  ALT 56  ALKPHOS 42  BILITOT 1.1  PROT 7.6  ALBUMIN 2.7*   No results for input(s): LIPASE, AMYLASE in the last 168 hours. No results for input(s): AMMONIA in the last 168 hours. Coagulation Profile:  Recent Labs Lab 02/11/16 1326  INR 1.42   Cardiac Enzymes:  Recent Labs Lab 02/12/16 1557 02/12/16 2228 02/13/16 0322 02/14/16 0310 02/15/16 0222  CKTOTAL 3956* 3419* 3442* 2121* 1474*   BNP (last 3 results) No results for input(s):  PROBNP in the last 8760 hours. HbA1C: No results for input(s): HGBA1C in the last 72 hours. CBG:  Recent Labs Lab 02/14/16 0735 02/14/16 1107 02/14/16 1636 02/14/16 2110 02/15/16 0739  GLUCAP 131* 172* 195* 151* 146*   Lipid Profile: No results for input(s): CHOL, HDL, LDLCALC, TRIG, CHOLHDL, LDLDIRECT in the last 72 hours. Thyroid Function Tests: No results for input(s): TSH, T4TOTAL, FREET4, T3FREE, THYROIDAB in the last 72 hours. Anemia Panel: No results for input(s): VITAMINB12, FOLATE, FERRITIN, TIBC, IRON, RETICCTPCT in the last 72 hours. Urine analysis:    Component Value Date/Time   COLORURINE YELLOW 02/10/2016 1802   APPEARANCEUR CLEAR 02/10/2016 1802   LABSPEC 1.021 02/10/2016 1802   PHURINE 5.0 02/10/2016 1802   GLUCOSEU NEGATIVE 02/10/2016 1802   HGBUR NEGATIVE 02/10/2016 1802   BILIRUBINUR NEGATIVE 02/10/2016 1802   KETONESUR NEGATIVE 02/10/2016 1802   PROTEINUR NEGATIVE 02/10/2016 1802   NITRITE NEGATIVE 02/10/2016 1802   LEUKOCYTESUR NEGATIVE 02/10/2016 1802   Sepsis Labs: @LABRCNTIP (procalcitonin:4,lacticidven:4)  ) Recent Results (from the past 240 hour(s))  Culture, blood (Routine X 2) w Reflex to ID Panel     Status: None (Preliminary result)    Collection Time: 02/10/16  8:40 AM  Result Value Ref Range Status   Specimen Description BLOOD RIGHT HAND  Final   Special Requests BOTTLES DRAWN AEROBIC ONLY 4CC  Final   Culture NO GROWTH 4 DAYS  Final   Report Status PENDING  Incomplete  Culture, blood (Routine X 2) w Reflex to ID Panel     Status: None (Preliminary result)   Collection Time: 02/10/16  9:00 AM  Result Value Ref Range Status   Specimen Description BLOOD LEFT HAND  Final   Special Requests IN PEDIATRIC BOTTLE 1.5CC  Final   Culture NO GROWTH 4 DAYS  Final   Report Status PENDING  Incomplete  Culture, Urine     Status: Abnormal   Collection Time: 02/10/16  6:02 PM  Result Value Ref Range Status   Specimen Description URINE, CLEAN CATCH  Final   Special Requests NONE  Final   Culture 1,000 COLONIES/mL INSIGNIFICANT GROWTH (A)  Final   Report Status 02/12/2016 FINAL  Final         Radiology Studies: Ct Abdomen Pelvis Wo Contrast  02/14/2016  CLINICAL DATA:  Status post cardiac arrest, with bilateral retroperitoneal bleeds and lower extremity weakness and paresthesia. Lower back pain. Patient on Eliquis. EXAM: CT ABDOMEN AND PELVIS WITHOUT CONTRAST TECHNIQUE: Multidetector CT imaging of the abdomen and pelvis was performed following the standard protocol without IV contrast. COMPARISON:  MRI of the lumbar spine performed 02/11/2016 FINDINGS: Minimal bibasilar atelectasis is noted. The liver and spleen are unremarkable in appearance. The gallbladder is within normal limits. The pancreas and adrenal glands are unremarkable. Nonspecific perinephric stranding is noted bilaterally. A 1.5 cm cyst is noted at the lower pole of the right kidney. The kidneys are otherwise unremarkable. There is no evidence of hydronephrosis. No renal or ureteral stones are identified. No free fluid is identified. The small bowel is unremarkable in appearance. The stomach is within normal limits. No acute vascular abnormalities are seen. There is mild  ectasia of the distal abdominal aorta and common iliac arteries bilaterally, without evidence of significant aneurysmal dilatation. The appendix is normal in caliber and contains contrast, without evidence of appendicitis. The colon is grossly unremarkable in appearance. The bladder is mildly distended and grossly unremarkable. The prostate is borderline normal in size. No inguinal lymphadenopathy is seen.  Hemorrhage is noted along the psoas musculature bilaterally, and along the iliacus musculature, with overlying hemorrhage, more prominent on the left. Mild hemorrhage is also seen tracking into the proximal lower extremities bilaterally. Mild soft tissue inflammation is noted overlying the psoas musculature. No acute osseous abnormalities are identified. Facet disease is noted at the lower lumbar spine. IMPRESSION: 1. Hemorrhage along the psoas musculature bilaterally, and along the iliacus musculature, with overlying hemorrhage, more prominent on the left. Mild hemorrhage tracking into the proximal lower extremities bilaterally. Mild soft tissue inflammation overlying the psoas musculature. 2. Small left renal cyst noted. 3. Mild ectasia of the distal abdominal aorta and common iliac arteries bilaterally, without significant aneurysmal dilatation. Electronically Signed   By: Garald Balding M.D.   On: 02/14/2016 04:07   Ct Head Wo Contrast  02/14/2016  CLINICAL DATA:  Post cardiac arrest requiring hypothermic protocol. Bilateral lower extremity weakness and paresthesia. Evaluate for CVA. EXAM: CT HEAD WITHOUT CONTRAST CT CERVICAL SPINE WITHOUT CONTRAST TECHNIQUE: Multidetector CT imaging of the head and cervical spine was performed following the standard protocol without intravenous contrast. Multiplanar CT image reconstructions of the cervical spine were also generated. COMPARISON:  Head CT 01/31/2016 FINDINGS: CT HEAD FINDINGS No intracranial hemorrhage, mass effect, or midline shift. No hydrocephalus. The  basilar cisterns are patent. No evidence of territorial infarct. No intracranial fluid collection. Atherosclerosis of skullbase vasculature. Calvarium is intact. Mild mucosal thickening of left frontal sinus, paranasal sinuses otherwise clear. Mastoid air cells are well aerated. CT CERVICAL SPINE FINDINGS Cervica straightening of normal cervical lordosis, no listhesis. Disc space narrowing at C4-C5 with associated endplate spurs. Endplate spurring at D34-534 and C6-C7 with preservation of disc space. Vertebral body heights are preserved. There is no fracture. The dens is intact. There are no jumped or perched facets. No evidence of large volume hemorrhage in the spinal canal. No prevertebral soft tissue edema. IMPRESSION: 1. No acute intracranial abnormality. No evidence of acute ischemia. 2. Mild degenerative change in the cervical spine. No acute abnormality. Electronically Signed   By: Jeb Levering M.D.   On: 02/14/2016 01:42   Ct Cervical Spine Wo Contrast  02/14/2016  CLINICAL DATA:  Post cardiac arrest requiring hypothermic protocol. Bilateral lower extremity weakness and paresthesia. Evaluate for CVA. EXAM: CT HEAD WITHOUT CONTRAST CT CERVICAL SPINE WITHOUT CONTRAST TECHNIQUE: Multidetector CT imaging of the head and cervical spine was performed following the standard protocol without intravenous contrast. Multiplanar CT image reconstructions of the cervical spine were also generated. COMPARISON:  Head CT 01/31/2016 FINDINGS: CT HEAD FINDINGS No intracranial hemorrhage, mass effect, or midline shift. No hydrocephalus. The basilar cisterns are patent. No evidence of territorial infarct. No intracranial fluid collection. Atherosclerosis of skullbase vasculature. Calvarium is intact. Mild mucosal thickening of left frontal sinus, paranasal sinuses otherwise clear. Mastoid air cells are well aerated. CT CERVICAL SPINE FINDINGS Cervica straightening of normal cervical lordosis, no listhesis. Disc space  narrowing at C4-C5 with associated endplate spurs. Endplate spurring at D34-534 and C6-C7 with preservation of disc space. Vertebral body heights are preserved. There is no fracture. The dens is intact. There are no jumped or perched facets. No evidence of large volume hemorrhage in the spinal canal. No prevertebral soft tissue edema. IMPRESSION: 1. No acute intracranial abnormality. No evidence of acute ischemia. 2. Mild degenerative change in the cervical spine. No acute abnormality. Electronically Signed   By: Jeb Levering M.D.   On: 02/14/2016 01:42   Ct Thoracic Spine Wo Contrast  02/14/2016  CLINICAL DATA:  Post cardiac arrest requiring hypothermic protocol. Subsequently developed bilateral retroperitoneal bleed and bilateral lower extremity weakness and paresthesias. EXAM: CT THORACIC SPINE WITHOUT CONTRAST TECHNIQUE: Multidetector CT imaging of the thoracic spine was performed without intravenous contrast administration. Multiplanar CT image reconstructions were also generated. COMPARISON:  None. FINDINGS: Normal alignment. No compression deformity. Mild diffuse disc space narrowing throughout the thoracic spine. Anterior endplate spurring in the mid lower thoracic spine. No acute fracture. No bony spinal canal narrowing. No evidence of hemorrhage in the spinal canal. 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 No acute bony abnormality. No bony spinal canal narrowing. Electronically Signed   By: Jeb Levering M.D.   On: 02/14/2016 03:47        Scheduled Meds: . amiodarone  400 mg Oral Daily  . carvedilol  37.5 mg Oral BID WC  . docusate sodium  100 mg Oral BID  . feeding supplement (ENSURE ENLIVE)  237 mL Oral BID BM  . insulin aspart  0-5 Units Subcutaneous QHS  . insulin aspart  0-9 Units Subcutaneous TID WC  . isosorbide-hydrALAZINE  0.5 tablet Oral TID  . levothyroxine  75 mcg Oral QAC breakfast  . polyethylene glycol  17 g Oral Daily   Continuous  Infusions: . sodium chloride 10 mL/hr at 02/11/16 1757     LOS: 14 days    Time spent: 40 minutes    WOODS, Geraldo Docker, MD [facility] Pager [phone]   If 7PM-7AM, please contact night-coverage [email or web address] Password [email or web address] 02/15/2016, 9:09 AM

## 2016-02-15 NOTE — Progress Notes (Signed)
Patient Name: Eric [name] Date of Encounter: 02/15/2016  Principal Problem:   Cardiac arrest [facility]) Active Problems:   Chronic systolic heart failure ([place])   Hyperlipidemia   Left bundle branch block   Obesity (BMI 30-39.9)   Atrial flutter ([place])   Acute respiratory failure ([place])   Encounter for central line placement   Arrhythmia   Altered mental status   HCAP (healthcare-associated pneumonia)   AKI (acute kidney injury) ([place])   Ventricular fibrillation (HCC)   Aortic aneurysm without rupture (HCC)   Aortic insufficiency   Back pain   Leg weakness, bilateral   Paroxysmal atrial fibrillation (H[facility])   Pneumonia   Acute lumbar back pain   Non-traumatic rhabdomyolysis   Retroperitoneal bleed   Weakness of both lower extremities   Lower extremity weakness   Femoral neuropathy   Length of Stay: 14  SUBJECTIVE Denies CP and palpitations. Having lower back pain that radiates to rt thigh. Pain is relieved w/ percocet.    Tele: sinus rhythm, HR 111   CURRENT MEDS . amiodarone  400 mg Oral Daily  . carvedilol  37.5 mg Oral BID WC  . docusate sodium  100 mg Oral BID  . feeding supplement (ENSURE ENLIVE)  237 mL Oral BID BM  . insulin aspart  0-5 Units Subcutaneous QHS  . insulin aspart  0-9 Units Subcutaneous TID WC  . isosorbide-hydrALAZINE  0.5 tablet Oral TID  . levothyroxine  75 mcg Oral QAC breakfast  . polyethylene glycol  17 g Oral Daily    OBJECTIVE   Intake/Output Summary (Last 24 hours) at 02/15/16 0753 Last data filed at 02/15/16 0740  Gross per 24 hour  Intake   1180 ml  Output   1450 ml  Net   -270 ml   Filed Weights   02/13/16 0427 02/14/16 0318 02/15/16 0316  Weight: 250 lb (113.399 kg) 243 lb 9.7 oz (110.5 kg) 250 lb 3.6 oz (113.5 kg)    PHYSICAL EXAM Filed Vitals:   02/15/16 0316 02/15/16 0317 02/15/16 0400 02/15/16 0738  BP:  133/79  147/81  Pulse:  99 86 93  Temp:  98 F (36.7 C)  98.5 F (36.9 C)  TempSrc:  Oral  Oral  Resp:  16  22 19   Height:      Weight: 250 lb 3.6 oz (113.5 kg)     SpO2:  99% 99% 99%   General: Alert, oriented x3, no distress Head: EOMI, moist mucous membranes Pulm: clear to auscultation, no crackles or rhonchi Cardiovascular: RRR, no murmurs or gallops Abdomen: no tenderness or distention, no masses by palpation, no abnormal pulsatility or arterial bruits, normal bowel sounds, no hepatosplenomegaly Extremities: no clubbing, cyanosis or edema; Neurological: CN 2-12 grossly intact Skin: warm and dry  LABS  CBC  Recent Labs  02/13/16 0322  02/14/16 0310 02/15/16 0222  WBC 22.1*  --  20.4* 17.3*  NEUTROABS 17.3*  --  15.7*  --   HGB 8.6*  < > 8.7* 9.0*  HCT 27.4*  < > 27.4* 28.4*  MCV 94.2  --  95.5 93.7  PLT 262  --  229 213  < > = values in this interval not displayed. Basic Metabolic Panel  Recent Labs  02/14/16 0310 02/15/16 0222  NA 143 137  K 4.1 4.1  CL 112* 108  CO2 26 22  GLUCOSE 141* 155*  BUN 51* 42*  CREATININE 1.83* 1.05  CALCIUM 8.3* 8.3*  MG 2.7* 2.3  PHOS 2.3*  --  Liver Function Tests No results for input(s): AST, ALT, ALKPHOS, BILITOT, PROT, ALBUMIN in the last 72 hours. No results for input(s): LIPASE, AMYLASE in the last 72 hours. Cardiac Enzymes  Recent Labs  02/13/16 0322 02/14/16 0310 02/15/16 0222  CKTOTAL [id number]* 2121* 55*    Radiology Studies CT ABDOMEN AND PELVIS WITHOUT CONTRAST  TECHNIQUE: Multidetector CT imaging of the abdomen and pelvis was performed following the standard protocol without IV contrast.  COMPARISON: MRI of the lumbar spine performed 02/11/2016  FINDINGS: Minimal bibasilar atelectasis is noted.  The liver and spleen are unremarkable in appearance. The gallbladder is within normal limits. The pancreas and adrenal glands are unremarkable.  Nonspecific perinephric stranding is noted bilaterally. A 1.5 cm cyst is noted at the lower pole of the right kidney. The kidneys are otherwise unremarkable.  There is no evidence of hydronephrosis. No renal or ureteral stones are identified.  No free fluid is identified. The small bowel is unremarkable in appearance. The stomach is within normal limits. No acute vascular abnormalities are seen. There is mild ectasia of the distal abdominal aorta and common iliac arteries bilaterally, without evidence of significant aneurysmal dilatation.  The appendix is normal in caliber and contains contrast, without evidence of appendicitis. The colon is grossly unremarkable in appearance.  The bladder is mildly distended and grossly unremarkable. The prostate is borderline normal in size. No inguinal lymphadenopathy is seen.  Hemorrhage is noted along the psoas musculature bilaterally, and along the iliacus musculature, with overlying hemorrhage, more prominent on the left. Mild hemorrhage is also seen tracking into the proximal lower extremities bilaterally. Mild soft tissue inflammation is noted overlying the psoas musculature.  No acute osseous abnormalities are identified. Facet disease is noted at the lower lumbar spine.  IMPRESSION: 1. Hemorrhage along the psoas musculature bilaterally, and along the iliacus musculature, with overlying hemorrhage, more prominent on the left. Mild hemorrhage tracking into the proximal lower extremities bilaterally. Mild soft tissue inflammation overlying the psoas musculature. 2. Small left renal cyst noted. 3. Mild ectasia of the distal abdominal aorta and common iliac arteries bilaterally, without significant aneurysmal dilatation.   Electronically Signed  By: Garald Balding M.D.  On: 02/14/2016 04:07     ASSESSMENT AND PLAN  1. S/P cardiac arrest, presumed VT/VF VF on AED interrogation. Plan for CRT-D on this admission, secondary prevention. Normal electrolytes on admission. Moderate to severely depressed LVEF despite chronic comprehensive medical treatment. - will need cardiac cath   once renal function improved. Creat trending down. Still has CK elevation. Hopefully aim for later this week.   2. Chronic systolic heart failure-TTE 5/17 reveals EF of 99991111, grade 2 diastolic dysfunction. Holding lasix due to AKI. Volume status looks normal. Continue carvedilol and Bidil. Not a candidate for ACEi/ARB due to AKI.  3. 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 TEE 02/08/16 revealed moderate aortic regurgitation with eccentric jet. Aortic root 5.2cm. Consider CT in 6 months.  4. Atrial fibrillation-- now in sinus rhythm. On home coreg 25mg  BID. Off anticoagulants due to retroperitoneal bleeding.  Continue amiodarone po 400mg  daily.   5. Hypertension- controlled, continue coreg, resumed bidil 20-37.5mg  yesterday. BP runs low at times- will not increase dose yet.  6. Aortic root enlargement - aortic root measures 32mm on TEE -May benefit CT Angio when renal function is clearly stabilized.  7. 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 CK elevated. Likely rhabdo cause of  renal failure, nephro following. Thankfully, creatinine is improving now 1.8. Back pain and muscle weakness could be due to rhabo as well, ? related to retroperitoneal bleed causing irritation around psoas muscles. Will need PT.     8. Left bundle branch block typical LBBB with QRS duration 178ms predicts response to CRT.  9. Hyperlipidemia - will resume statin when rhabdo resolves.   10. Possible aspiration pneumonia- completed course of abx. CXR 5/18 neg for infection.   11. Leukocytosis -- likely due to acute retroperitoneal bleed, resolving. Afebrile overnight  12. Anemia-- hgb 7.1 previously from 9.5 secondary to retroperitoneal bleed, discovered by MRI back.  - Status post 2 units of blood with diuretics  - Continue to hold heparin IV (was placed in the setting of cardioversion) - consider iron  supplements.    Julious Oka, MD   02/15/2016 7:53 AM  Patient seen and examined and history reviewed. Agree with above findings and plan. Patient continues to improve. No VT/VF noted on monitor. Renal function has returned to baseline. May consider addition of ACEi and/or aldactone once renal function fully recovered. Anticipate cardiac cath on Thursday. If no intervention needed will let EP service know for placement of ICD/CRT. No evidence of further bleeding.   Peter Martinique, [place] 02/15/2016 3:29 PM

## 2016-02-15 NOTE — Progress Notes (Signed)
Patient ID: Eric [name], male   DOB: [birth year], 65 y.o.   MRN: [id number]  [facility] Progress Note   Assessment/ Plan:   1. AKI on chronic kidney disease stage IV (baseline creatinine 1.8-2.0): Nonoliguric. The creatinine from today's labs appears to be erroneous as it is clearly way lower than his chronic/stable baseline. Yesterday's labs showed improvement/recovery of renal function back to baseline after what appears to have been renal insufficiency that was probably hemodynamically mediated with cardiac arrest and from rhabdomyolysis. No intervention at this point. Anticipate iodinated intravenous contrast exposure for coronary angiogram at some point later this week. 2. Rhabdomyolysis: CPK levels trending downwards-currently 1500 down from 2100. Seen by neurology yesterday for concerns of bilateral femoral neuropathy secondary to retroperitoneal hematoma. 3. Cardiac arrest: Presumed V. tach/V. fib with V. fib seen on AED interrogation. Anticipated to undergo further evaluation with coronary angiogram. 4. Chronic systolic heart failure: EF of 30-35%. Furosemide currently on hold. This may limit the aggressiveness of intravenous fluid therapy pre-and post coronary angiogram. 5. Hypertension: Blood pressure is elevated-monitor on current antihypertensive therapy for further adjustments.  Subjective:   He endorses some right thigh/back pain with bilateral leg weakness. He denies any chest pain or shortness of breath.    Objective:   BP 147/81 mmHg  Pulse 93  Temp(Src) 98.5 F (36.9 C) (Oral)  Resp 19  Ht 5\' 9"  (1.753 m)  Wt 113.5 kg (250 lb 3.6 oz)  BMI 36.93 kg/m2  SpO2 99%  Intake/Output Summary (Last 24 hours) at 02/15/16 [id number] Last data filed at 02/15/16 0900  Gross per 24 hour  Intake   1060 ml  Output   1175 ml  Net   -115 ml   Weight change: 3 kg (6 lb 9.8 oz)  Physical Exam: [id number] resting in bed CVS: Pulse regular rhythm, normal rate, S1 and  S2 normal. Resp: Clear to auscultation bilaterally-no rales/rhonchi Abd: Soft, flat, nontender Ext: No lower extremity edema  Imaging: Ct Abdomen Pelvis Wo Contrast  02/14/2016  CLINICAL DATA:  Status post cardiac arrest, with bilateral retroperitoneal bleeds and lower extremity weakness and paresthesia. Lower back pain. Patient on Eliquis. EXAM: CT ABDOMEN AND PELVIS WITHOUT CONTRAST TECHNIQUE: Multidetector CT imaging of the abdomen and pelvis was performed following the standard protocol without IV contrast. COMPARISON:  MRI of the lumbar spine performed 02/11/2016 FINDINGS: Minimal bibasilar atelectasis is noted. The liver and spleen are unremarkable in appearance. The gallbladder is within normal limits. The pancreas and adrenal glands are unremarkable. Nonspecific perinephric stranding is noted bilaterally. A 1.5 cm cyst is noted at the lower pole of the right kidney. The kidneys are otherwise unremarkable. There is no evidence of hydronephrosis. No renal or ureteral stones are identified. No free fluid is identified. The small bowel is unremarkable in appearance. The stomach is within normal limits. No acute vascular abnormalities are seen. There is mild ectasia of the distal abdominal aorta and common iliac arteries bilaterally, without evidence of significant aneurysmal dilatation. The appendix is normal in caliber and contains contrast, without evidence of appendicitis. The colon is grossly unremarkable in appearance. The bladder is mildly distended and grossly unremarkable. The prostate is borderline normal in size. No inguinal lymphadenopathy is seen. Hemorrhage is noted along the psoas musculature bilaterally, and along the iliacus musculature, with overlying hemorrhage, more prominent on the left. Mild hemorrhage is also seen tracking into the proximal lower extremities bilaterally. Mild soft tissue inflammation is noted overlying the psoas musculature. No acute osseous  abnormalities are  identified. Facet disease is noted at the lower lumbar spine. IMPRESSION: 1. Hemorrhage along the psoas musculature bilaterally, and along the iliacus musculature, with overlying hemorrhage, more prominent on the left. Mild hemorrhage tracking into the proximal lower extremities bilaterally. Mild soft tissue inflammation overlying the psoas musculature. 2. Small left renal cyst noted. 3. Mild ectasia of the distal abdominal aorta and common iliac arteries bilaterally, without significant aneurysmal dilatation. Electronically Signed   By: Garald Balding M.D.   On: 02/14/2016 04:07   Ct Head Wo Contrast  02/14/2016  CLINICAL DATA:  Post cardiac arrest requiring hypothermic protocol. Bilateral lower extremity weakness and paresthesia. Evaluate for CVA. EXAM: CT HEAD WITHOUT CONTRAST CT CERVICAL SPINE WITHOUT CONTRAST TECHNIQUE: Multidetector CT imaging of the head and cervical spine was performed following the standard protocol without intravenous contrast. Multiplanar CT image reconstructions of the cervical spine were also generated. COMPARISON:  Head CT 01/31/2016 FINDINGS: CT HEAD FINDINGS No intracranial hemorrhage, mass effect, or midline shift. No hydrocephalus. The basilar cisterns are patent. No evidence of territorial infarct. No intracranial fluid collection. Atherosclerosis of skullbase vasculature. Calvarium is intact. Mild mucosal thickening of left frontal sinus, paranasal sinuses otherwise clear. Mastoid air cells are well aerated. CT CERVICAL SPINE FINDINGS Cervica straightening of normal cervical lordosis, no listhesis. Disc space narrowing at C4-C5 with associated endplate spurs. Endplate spurring at D34-534 and C6-C7 with preservation of disc space. Vertebral body heights are preserved. There is no fracture. The dens is intact. There are no jumped or perched facets. No evidence of large volume hemorrhage in the spinal canal. No prevertebral soft tissue edema. IMPRESSION: 1. No acute intracranial  abnormality. No evidence of acute ischemia. 2. Mild degenerative change in the cervical spine. No acute abnormality. Electronically Signed   By: Jeb Levering M.D.   On: 02/14/2016 01:42   Ct Cervical Spine Wo Contrast  02/14/2016  CLINICAL DATA:  Post cardiac arrest requiring hypothermic protocol. Bilateral lower extremity weakness and paresthesia. Evaluate for CVA. EXAM: CT HEAD WITHOUT CONTRAST CT CERVICAL SPINE WITHOUT CONTRAST TECHNIQUE: Multidetector CT imaging of the head and cervical spine was performed following the standard protocol without intravenous contrast. Multiplanar CT image reconstructions of the cervical spine were also generated. COMPARISON:  Head CT 01/31/2016 FINDINGS: CT HEAD FINDINGS No intracranial hemorrhage, mass effect, or midline shift. No hydrocephalus. The basilar cisterns are patent. No evidence of territorial infarct. No intracranial fluid collection. Atherosclerosis of skullbase vasculature. Calvarium is intact. Mild mucosal thickening of left frontal sinus, paranasal sinuses otherwise clear. Mastoid air cells are well aerated. CT CERVICAL SPINE FINDINGS Cervica straightening of normal cervical lordosis, no listhesis. Disc space narrowing at C4-C5 with associated endplate spurs. Endplate spurring at D34-534 and C6-C7 with preservation of disc space. Vertebral body heights are preserved. There is no fracture. The dens is intact. There are no jumped or perched facets. No evidence of large volume hemorrhage in the spinal canal. No prevertebral soft tissue edema. IMPRESSION: 1. No acute intracranial abnormality. No evidence of acute ischemia. 2. Mild degenerative change in the cervical spine. No acute abnormality. Electronically Signed   By: Jeb Levering M.D.   On: 02/14/2016 01:42   Ct Thoracic Spine Wo Contrast  02/14/2016  CLINICAL DATA:  Post cardiac arrest requiring hypothermic protocol. Subsequently developed bilateral retroperitoneal bleed and bilateral lower  extremity weakness and paresthesias. EXAM: CT THORACIC SPINE WITHOUT CONTRAST TECHNIQUE: Multidetector CT imaging of the thoracic spine was performed without intravenous contrast administration. Multiplanar CT image reconstructions  were also generated. COMPARISON:  None. FINDINGS: Normal alignment. No compression deformity. Mild diffuse disc space narrowing throughout the thoracic spine. Anterior endplate spurring in the mid lower thoracic spine. No acute fracture. No bony spinal canal narrowing. No evidence of hemorrhage in the spinal canal. 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 No acute bony abnormality. No bony spinal canal narrowing. Electronically Signed   By: Jeb Levering M.D.   On: 02/14/2016 03:47    Labs: BMET  Recent Labs Lab 02/09/16 0723 02/10/16 0250 02/11/16 0406 02/12/16 0428 02/13/16 0322 02/14/16 0310 02/15/16 0222  NA 146* 144 144 145 143 143 137  K 4.4 4.2 3.9 4.1 4.0 4.1 4.1  CL 108 109 107 105 107 112* 108  CO2 22 21* 25 27 27 26 22   GLUCOSE 138* 121* 160* 166* 179* 141* 155*  BUN 81* 98* 116* 100* 72* 51* 42*  CREATININE 2.87* 3.15* 3.53* 2.80* 2.22* 1.83* 1.05  CALCIUM 9.1 8.6* 8.3* 8.2* 8.3* 8.3* 8.3*  PHOS 4.9* 5.5*  --   --   --  2.3*  --    CBC  Recent Labs Lab 02/11/16 1930  02/12/16 0428  02/13/16 0322 02/13/16 0653 02/13/16 1200 02/14/16 0310 02/15/16 0222  WBC 24.3*  --  23.3*  --  22.1*  --   --  20.4* 17.3*  NEUTROABS 19.7*  --  17.9*  --  17.3*  --   --  15.7*  --   HGB 9.1*  < > 8.6*  < > 8.6* 8.7* 8.8* 8.7* 9.0*  HCT 27.8*  < > 26.3*  < > 27.4* 27.5* 28.0* 27.4* 28.4*  MCV 90.6  --  91.0  --  94.2  --   --  95.5 93.7  PLT 294  --  277  --  262  --   --  229 213  < > = values in this interval not displayed.  Medications:    . amiodarone  400 mg Oral Daily  . carvedilol  37.5 mg Oral BID WC  . docusate sodium  100 mg Oral BID  . feeding supplement (ENSURE ENLIVE)  237 mL Oral BID BM  . insulin  aspart  0-5 Units Subcutaneous QHS  . insulin aspart  0-9 Units Subcutaneous TID WC  . isosorbide-hydrALAZINE  0.5 tablet Oral TID  . levothyroxine  75 mcg Oral QAC breakfast  . polyethylene glycol  17 g Oral Daily   Elmarie Shiley, MD 02/15/2016, 9:25 AM

## 2016-02-16 DIAGNOSIS — I469 Cardiac arrest, cause unspecified: Secondary | ICD-10-CM | POA: Diagnosis not present

## 2016-02-16 DIAGNOSIS — N179 Acute kidney failure, unspecified: Secondary | ICD-10-CM | POA: Diagnosis not present

## 2016-02-16 DIAGNOSIS — I4901 Ventricular fibrillation: Secondary | ICD-10-CM | POA: Diagnosis not present

## 2016-02-16 DIAGNOSIS — I712 Thoracic aortic aneurysm, without rupture: Secondary | ICD-10-CM | POA: Diagnosis not present

## 2016-02-16 LAB — GLUCOSE, CAPILLARY
GLUCOSE-CAPILLARY: 134 mg/dL — AB (ref 65–99)
GLUCOSE-CAPILLARY: 149 mg/dL — AB (ref 65–99)
GLUCOSE-CAPILLARY: 139 mg/dL — AB (ref 65–99)
GLUCOSE-CAPILLARY: 145 mg/dL — AB (ref 65–99)

## 2016-02-16 LAB — MAGNESIUM: Magnesium: 2.2 mg/dL (ref 1.7–2.4)

## 2016-02-16 LAB — CK: Total CK: 776 U/L — ABNORMAL HIGH (ref 49–397)

## 2016-02-16 LAB — BASIC METABOLIC PANEL
ANION GAP: 7 (ref 5–15)
BUN: 38 mg/dL — ABNORMAL HIGH (ref 6–20)
CO2: 20 mmol/L — ABNORMAL LOW (ref 22–32)
Calcium: 8.4 mg/dL — ABNORMAL LOW (ref 8.9–10.3)
Chloride: 108 mmol/L (ref 101–111)
Creatinine, Ser: 1.6 mg/dL — ABNORMAL HIGH (ref 0.61–1.24)
GFR calc Af Amer: 51 mL/min — ABNORMAL LOW (ref >60)
GFR, EST NON AFRICAN AMERICAN: 44 mL/min — AB (ref >60)
GLUCOSE: 130 mg/dL — AB (ref 65–99)
POTASSIUM: 4.2 mmol/L (ref 3.5–5.1)
SODIUM: 135 mmol/L (ref 135–145)

## 2016-02-16 LAB — CBC
HCT: 28.9 % — ABNORMAL LOW (ref 39.0–52.0)
Hemoglobin: 9.2 g/dL — ABNORMAL LOW (ref 13.0–17.0)
MCH: 30.1 pg (ref 26.0–34.0)
MCHC: 31.8 g/dL (ref 30.0–36.0)
MCV: 94.4 fL (ref 78.0–100.0)
PLATELETS: 222 10*3/uL (ref 150–400)
RBC: 3.06 MIL/uL — AB (ref 4.22–5.81)
RDW: 14.9 % (ref 11.5–15.5)
WBC: 15.4 10*3/uL — AB (ref 4.0–10.5)

## 2016-02-16 NOTE — Progress Notes (Signed)
[place] TEAM 1 - Stepdown/ICU TEAM  Eric [name]  [id number] DOB: 11-23-1950 DOA: 01/31/2016 PCP: [name], MD    Brief Narrative:  65 yo M Hx Chronic Systolic CHF, A-fib on Eliquis, HTN, Heart murmur, CKD, Hypothyroidism, and Chronic Pain Syndrome (low back) who was in his USOH until the evening of 05/08 when he began to breath very heavily while driving, before becoming unresponsive. His wife immediately got the car stopped, got him out of the car and began CPR while calling EMS.  EMS arrived within 5 - 10 minutes and upon their arrival, he received 2 defibrillations via AED. ROSC was achieved within roughly 15 - 20 minutes.  Upon ED arrival, he remained unresponsive and was subsequently intubated. PCCM was called for admission and consideration of hypothermia protocol. Cardiology was also called in consultation.  Assessment & Plan:  Cardiac arrest - VT/Vfib -CPR performed by wife and EMS - multiple shocks -Hypothermia protocol performed -Cardiology following - for cath in AM  LBBB  Chronic systolic CHF  -EF [id number] per TTE April 2017 - developing B LE edema - consider gentle diuresis post cardiac cath  Aortic aneurysm / aortic root enlargement  -4.9 cm aortic aneurysm - will need f/u CT of chest once Cr improved   Aortic valve regurgitation Cards following - to consider CT in 6 months in f/u   HTN  -Stable  Paroxysmal A.fib  -Hold all anticoagulants secondary to acute blood loss anemia - NSR at present   CITROBACTER KOSERI Aspiration Pneumonia  -Completed 7 day course antibiotics - no clinical sx to suggest ongoing infection at this time   Acute encephalopathy  -Resolved   New-onset bilateral LE weakness and numbness -believed to be due to bilateral retroperitoneal bleeds w/ compression of B femoral nerves - appears to be slowly improving - will need CIR stay after cardiac w/u complete  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 Baseline Cr~1.9 - Nephrology  following - crt appears to be back to his baseline   Hyperkalemia -resolved  Rhabdomyolysis -Atorvastatin stopped 5/17 -CK peaked at 7202 and has now essentially normalized   Nutrition -tolerating diet w/o difficulty   Hypothyroidism. -Synthroid 75 g daily  -TSH normal   Acute blood loss anemia / Bilateral Retroperitoneal Bleeds Hgb appears to have stabilized for now - follow   DM ?newly diagnoses - A1c 7.1 - adjust diet - SSI - follow CBG trend - begin education   DVT prophylaxis: SCDs Code Status: FULL CODE Family Communication: Spoke with the patient and his wife at bedside at length  Disposition Plan: SDU  Consultants:  [facility] Cardiology  Nephrology  Neurology PCCM   Significant Events: 05/08 - cardiac arrest. Started on TTM - 33 degrees C CT head 05/08 > no acute process Echo 5/9 > EF 30 to 35%, severe aortic regurgitation EEG 5/9 > no focal, hemispheric, or lateralizing features; demonstrated a burst/suppression pattern, which can be consistent with a sedated EEG 5/11>>mod diffuse slowing, No epileptiform activity was recorded. 5/10 pt rewarmed per hypothermia protocol 5/14 > Extubated 5/15 Hip spine films > no acute abnormality 5/16 TEE - EF= 30%- 35%. Diffuse hypokinesis - Aortic valve: Moderate regurgitation.- Left atrium: Moderately dilated.  5/19 MRI L-spine - Bilateral retroperitoneal hemorrhage involving the psoas musculature. Follow-up CT Abdomen and Pelvis could characterize further.  Antimicrobials:  Zosyn 5/11> 5/16 Vancomycin 5/11> 5/14 Ceftriaxone 5/16 > 5/18  Subjective: The patient has noted increased pedal edema.  He denies shortness of breath chest pain  nausea vomiting or abdominal pain.  He feels that he has regained some strength in his legs opverall but that there has not been any significant change since yesterday.  Objective: Blood pressure 124/67, pulse 70, temperature 97.9 F (36.6 C), temperature source Oral, resp. rate 16, height  5\' 9"  (1.753 m), weight 116.3 kg (256 lb 6.3 oz), SpO2 99 %.  Intake/Output Summary (Last 24 hours) at 02/16/16 1547 Last data filed at 02/16/16 0932  Gross per 24 hour  Intake    480 ml  Output   1050 ml  Net   -570 ml   Filed Weights   02/14/16 0318 02/15/16 0316 02/16/16 0341  Weight: 110.5 kg (243 lb 9.7 oz) 113.5 kg (250 lb 3.6 oz) 116.3 kg (256 lb 6.3 oz)    Examination: General: No acute respiratory distress - alert  Lungs: Clear to auscultation bilaterally - no wheezes or crackles Cardiovascular: Regular rate and rhythm without murmur gallop or rub Abdomen: Nontender, nondistended, soft, bowel sounds positive, no rebound, no ascites, no appreciable mass Extremities: No significant cyanosis, or clubbing - 2+ pedal edema B LE   CBC:  Recent Labs Lab 02/11/16 0406  02/11/16 1930  02/12/16 0428  02/13/16 0322 02/13/16 0653 02/13/16 1200 02/14/16 0310 02/15/16 0222 02/16/16 0532  WBC 26.6*  < > 24.3*  --  23.3*  --  22.1*  --   --  20.4* 17.3* 15.4*  NEUTROABS 20.7*  --  19.7*  --  17.9*  --  17.3*  --   --  15.7*  --   --   HGB 7.1*  < > 9.1*  < > 8.6*  < > 8.6* 8.7* 8.8* 8.7* 9.0* 9.2*  HCT 21.9*  < > 27.8*  < > 26.3*  < > 27.4* 27.5* 28.0* 27.4* 28.4* 28.9*  MCV 95.2  < > 90.6  --  91.0  --  94.2  --   --  95.5 93.7 94.4  PLT 279  < > 294  --  277  --  262  --   --  229 213 222  < > = values in this interval not displayed. Basic Metabolic Panel:  Recent Labs Lab 02/10/16 0250  02/12/16 0428 02/13/16 0322 02/14/16 0310 02/15/16 0222 02/16/16 0532  NA 144  < > 145 143 143 137 135  K 4.2  < > 4.1 4.0 4.1 4.1 4.2  CL 109  < > 105 107 112* 108 108  CO2 21*  < > 27 27 26 22  20*  GLUCOSE 121*  < > 166* 179* 141* 155* 130*  BUN 98*  < > 100* 72* 51* 42* 38*  CREATININE 3.15*  < > 2.80* 2.22* 1.83* 1.05 1.60*  CALCIUM 8.6*  < > 8.2* 8.3* 8.3* 8.3* 8.4*  MG 2.8*  < > 3.0* 3.2* 2.7* 2.3 2.2  PHOS 5.5*  --   --   --  2.3*  --   --   < > = values in this interval  not displayed. GFR: Estimated Creatinine Clearance: 58.6 mL/min (by C-G formula based on Cr of 1.6).  Liver Function Tests:  Recent Labs Lab 02/10/16 0827  AST 164*  ALT 56  ALKPHOS 42  BILITOT 1.1  PROT 7.6  ALBUMIN 2.7*   Coagulation Profile:  Recent Labs Lab 02/11/16 1326  INR 1.42    Cardiac Enzymes:  Recent Labs Lab 02/12/16 2228 02/13/16 0322 02/14/16 0310 02/15/16 0222 02/16/16 0532  CKTOTAL 3419* 3442* 2121* 1474* 776*  HbA1C: HGB A1C MFR BLD  Date/Time Value Ref Range Status  02/10/2016 03:47 PM 7.1* 4.8 - 5.6 % Final    Comment:    (NOTE)         Pre-diabetes: 5.7 - 6.4         Diabetes: >6.4         Glycemic control for adults with diabetes: <7.0   02/14/2012 06:20 AM 6.1* <5.7 % Final    Comment:    (NOTE)                                                                       According to the ADA Clinical Practice Recommendations for 2011, when HbA1c is used as a screening test:  >=6.5%   Diagnostic of Diabetes Mellitus           (if abnormal result is confirmed) 5.7-6.4%   Increased risk of developing Diabetes Mellitus References:Diagnosis and Classification of Diabetes Mellitus,Diabetes [id number] 1):S62-S69 and Standards of Medical Care in         Diabetes - 2011,Diabetes [id number] (Suppl 1):S11-S61.    CBG:  Recent Labs Lab 02/15/16 1129 02/15/16 1622 02/15/16 2124 02/16/16 0853 02/16/16 1241  GLUCAP 161* 169* 139* 134* 149*    Recent Results (from the past 240 hour(s))  Culture, blood (Routine X 2) w Reflex to ID Panel     Status: None   Collection Time: 02/10/16  8:40 AM  Result Value Ref Range Status   Specimen Description BLOOD RIGHT HAND  Final   Special Requests BOTTLES DRAWN AEROBIC ONLY 4CC  Final   Culture NO GROWTH 5 DAYS  Final   Report Status 02/15/2016 FINAL  Final  Culture, blood (Routine X 2) w Reflex to ID Panel     Status: None   Collection Time: 02/10/16  9:00 AM  Result Value Ref Range  Status   Specimen Description BLOOD LEFT HAND  Final   Special Requests IN PEDIATRIC BOTTLE 1.5CC  Final   Culture NO GROWTH 5 DAYS  Final   Report Status 02/15/2016 FINAL  Final  Culture, Urine     Status: Abnormal   Collection Time: 02/10/16  6:02 PM  Result Value Ref Range Status   Specimen Description URINE, CLEAN CATCH  Final   Special Requests NONE  Final   Culture 1,000 COLONIES/mL INSIGNIFICANT GROWTH (A)  Final   Report Status 02/12/2016 FINAL  Final     Scheduled Meds: . amiodarone  400 mg Oral Daily  . carvedilol  37.5 mg Oral BID WC  . docusate sodium  100 mg Oral BID  . feeding supplement (ENSURE ENLIVE)  237 mL Oral BID BM  . insulin aspart  0-5 Units Subcutaneous QHS  . insulin aspart  0-9 Units Subcutaneous TID WC  . isosorbide-hydrALAZINE  0.5 tablet Oral TID  . levothyroxine  75 mcg Oral QAC breakfast  . polyethylene glycol  17 g Oral Daily   Continuous Infusions: . sodium chloride 10 mL/hr at 02/11/16 1757     LOS: 15 days   Time spent: 25 minutes   Cherene Altes, MD [facility] Office  [phone] Pager - Text Page per Shea Evans as per below:  On-Call/Text Page:      [email or web address]  password [email or web address]  If 7PM-7AM, please contact night-coverage [email or web address] Password [facility] 02/16/2016, 3:47 PM

## 2016-02-16 NOTE — Progress Notes (Signed)
EP service is recalled to the case to evaluate for timing/plans for  ICD implant  SUBJECTIVE: The patient is doing well today.  At this time, he denies chest pain, shortness of breath, or any new concerns.   Marland Kitchen amiodarone  400 mg Oral Daily  . carvedilol  37.5 mg Oral BID WC  . docusate sodium  100 mg Oral BID  . feeding supplement (ENSURE ENLIVE)  237 mL Oral BID BM  . insulin aspart  0-5 Units Subcutaneous QHS  . insulin aspart  0-9 Units Subcutaneous TID WC  . isosorbide-hydrALAZINE  0.5 tablet Oral TID  . levothyroxine  75 mcg Oral QAC breakfast  . polyethylene glycol  17 g Oral Daily   . sodium chloride 10 mL/hr at 02/11/16 1757    OBJECTIVE: Physical Exam: Filed Vitals:   02/16/16 0400 02/16/16 0500 02/16/16 0800 02/16/16 0842  BP: 128/73   124/67  Pulse: 86 84  70  Temp: 98.4 F (36.9 C)  98.2 F (36.8 C)   TempSrc: Oral  Oral   Resp: 21 13  16   Height:      Weight:      SpO2: 98% 99%  99%    Intake/Output Summary (Last 24 hours) at 02/16/16 1118 Last data filed at 02/16/16 0932  Gross per 24 hour  Intake   1140 ml  Output   1525 ml  Net   -385 ml    Telemetry reveals  SR 80's-100  GEN- The patient is in NAD, alert and oriented x 3 today.   Head- normocephalic, atraumatic Eyes-  Sclera clear, conjunctiva pink Ears- hearing intact Oropharynx- clear Neck- supple Lungs- Clear to ausculation bilaterally, normal work of breathing Heart- Regular rate and rhythm, tachycardic, 2/6SM, no rubs or gallops GI- soft, NT, ND Extremities- no clubbing, cyanosis, or edema Skin- no rash or lesion Psych- euthymic mood, full affect Neuro- no gross deficits appreciated  LABS: Basic Metabolic Panel:  Recent Labs  02/14/16 0310 02/15/16 0222 02/16/16 0532  NA 143 137 135  K 4.1 4.1 4.2  CL 112* 108 108  CO2 26 22 20*  GLUCOSE 141* 155* 130*  BUN 51* 42* 38*  CREATININE 1.83* 1.05 1.60*  CALCIUM 8.3* 8.3* 8.4*  MG 2.7* 2.3 2.2  PHOS 2.3*  --   --     CBC:  Recent Labs  02/14/16 0310 02/15/16 0222 02/16/16 0532  WBC 20.4* 17.3* 15.4*  NEUTROABS 15.7*  --   --   HGB 8.7* 9.0* 9.2*  HCT 27.4* 28.4* 28.9*  MCV 95.5 93.7 94.4  PLT 229 213 222   Cardiac Enzymes:  Recent Labs  02/14/16 0310 02/15/16 0222 02/16/16 0532  CKTOTAL 2121* 1474* 776*     RADIOLOGY: 02/13/16: CT abdomen/pelvis IMPRESSION: 1. Hemorrhage along the psoas musculature bilaterally, and along the iliacus musculature, with overlying hemorrhage, more prominent on the left. Mild hemorrhage tracking into the proximal lower extremities bilaterally. Mild soft tissue inflammation overlying the psoas musculature. 2. Small left renal cyst noted. 3. Mild ectasia of the distal abdominal aorta and common iliac arteries bilaterally, without significant aneurysmal dilatation.   ASSESSMENT AND PLAN:  Assessment and Plan:  1. Cardiac arrest witnessed out of hospital on 01/31/16 with CPR EMS record reviewed. FD first to respond applied AED and advised shock, patient received 2 shocks followed by further CPR with ROSC      AED rhythm reviewed, was VF  Intubated in ED   s/p Hypothermia protocol, rewarming completed 02/03/16  Chronic LBBB  EF now 30-35% (40-45% only 3 weeks ago)  schemic w/u (cath) pending 02/01/16 IV amiodarone stopped secondary to bradycardia, 2nd degree AVBlock/wenckebach with variable conduction, 2:1 in the 30's noted, and prolonged QT in environment of K+ 3.0   WCT reported 5/13 PM felt to have been AF by notes and placed back on amio gtt.   BB re-started 02/06/16  Extubated 02/06/16 Amio gtt resumed, felt bradycardia/heart block 5/9 in the environment of cooling and electrolyte imbalance.  QT felt stable by Dr. Caryl Comes at that time (02/07/16) and has since been transition to PO amiodarone, on 400mg  daily.     NSVT mentioned 02/10/16 by cardiology note  He is scheduled for [facility] tomorrow to evaluate his coronary  anatomy Keierra Nudo plan for ICD afterwards pending the findings, plan for CRT-D given LBBB  3. Pneumonia  Extubated, 02/06/16  speech therapy has seen patient, aphonia s/p prolonged intibation, much better  On abx  CCM following  3. Acute on chronic renal insuff.  nephrology called to the case   Creat peak 3.53 today is 1.60  4. Aortic root dilation     Cardiology plans CT Angio in 1mo to follow  Aortic insuff is worse, >> Severe on echo here  is tri leaflet  TEE 02/08/16 revealed moderate aortic regurgitation with eccentric jet. Aortic root 5.2cm.    5. AFib (described as persistent in notes)  SR currently/PAF here  CHA2DS2Vasc is at least 2 on Eliquis out patient     Heparin gtt here stopped secondary to hemorrhage  6. HTN  Off pressors and resumed on coreg, up-titrated yesterday     C/w primary team  7. CM     EF by his TEE 02/09/16 was 30-35%     LBBB     He is euvolemic by his I/O's and exam  8. Spontaneous hemorrhage psoas musculature b/l on 02/13/16     Acute blood loss anemia     Hgb down to 6.7 and is s/p PRBC x2 units, today is9.2     CK elevation max was 6520, today is 776 (statin has been held)     Remains with LE weakness, neurology on case recommend consideration for surgical decompression of femoral nerve and/or PT     Medicine's note describes improving function and in d/w the patient holding off on surgical intervention  9. Leukocytosis     Peak 26.6, down to 15.4     Aspiration pneumonia     hemorrhage       Tommye Standard, PA-C 02/16/2016 11:18 AM    I have seen and examined this patient with Tommye Standard.  Agree with above, note added to reflect my findings.  On exam, regular rhythm, no murmurs, lungs clear.  Presented with VF arrest.  Complicated by RP bleed, anemia and renal failure.  Improving without major complaint other than lower extremity weakness today.  Plan for CRT-D placement as he has HF and a LBBB assocaited  with his hx of VT.  Plan for cath tomorrow and pending results may benefit from CRT tomorrow.  Risks and benefits of the procedure explained.  Risks include bleeding, tamponade, pneumothorax, infection.  Deari Sessler M. Lovell Roe MD 02/16/2016 12:52 PM

## 2016-02-16 NOTE — Progress Notes (Signed)
Physical Therapy Treatment Patient Details Name: Eric [name] MRN: [id number] DOB: 04/21/1951 Today's Date: 02/16/2016    History of Present Illness 65 y.o. M with known sCHF and A.fib, presented 05/08 after out of hospital cardiac arrest. ROSC within 15-20 minutes after defib x 2. ETT 5/8-5/14. Now with acute encephalopathy with concern for anoxia per MD notes. 5/17 noted incr bil leg weakness and back pain; 5/19 MRI Bilateral retroperitoneal hemorrhage involving the psoas muscles; 5/22 neuro consult with ?need for surgery to decompress femoral nerves    PT Comments    Patient progressing slowly, but able to demonstrate weight shifts and longer standing tolerance.  Will need inpatient rehab for interdisciplinary care prior to d/c home.  Goals updated to reflect LE weakness.  Follow Up Recommendations  CIR;Supervision/Assistance - 24 hour     Equipment Recommendations  Other (comment) (TBA)    Recommendations for Other Services       Precautions / Restrictions Precautions Precautions: Fall    Mobility  Bed Mobility Overal bed mobility: Needs Assistance Bed Mobility: Rolling Rolling: Mod assist Sidelying to sit: Mod assist;+2 for physical assistance       General bed mobility comments: rolled to get of bedpain with use of rail and assist for hips; then rolled other way again with assist for hips, then side to sit with assist for trunk and legs  Transfers     Transfers: Sit to/from Stand;Stand Pivot Transfers Sit to Stand: Total assist;+2 safety/equipment Stand pivot transfers: Total assist;+2 safety/equipment (with Clarise Cruz lift)       General transfer comment: able to assist to stand from bed with use of Sara lift, then standing over about 2 minutes as pivoted to chair and performed activities as listed in balance section  Ambulation/Gait                 Stairs            Wheelchair Mobility    Modified Rankin (Stroke Patients Only)        Balance     Sitting balance-Leahy Scale: Fair Sitting balance - Comments: initially needing UE support for balance, then able to sit without hands on bed     Standing balance-Leahy Scale: Zero Standing balance comment: stood about 2 minutes with weight shifts, cues for trunk extension and hip extension                    Cognition Arousal/Alertness: Awake/alert Behavior During Therapy: WFL for tasks assessed/performed Overall Cognitive Status: Within Functional Limits for tasks assessed                      Exercises General Exercises - Lower Extremity Ankle Circles/Pumps: AROM;Both;10 reps Heel Slides: AAROM;Strengthening;5 reps;Supine (assist for flexion, resistance to extension)    General Comments        Pertinent Vitals/Pain Faces Pain Scale: Hurts even more Pain Location: R thigh Pain Descriptors / Indicators: Aching Pain Intervention(s): Monitored during session;Repositioned    Home Living                      Prior Function            PT Goals (current goals can now be found in the care plan section) Progress towards PT goals: Progressing toward goals    Frequency  Min 3X/week    PT Plan Current plan remains appropriate    Co-evaluation  End of Session Equipment Utilized During Treatment: Other (comment) Clarise Cruz plus) Activity Tolerance: Patient tolerated treatment well Patient left: in chair;with call bell/phone within reach;with chair alarm set     Time: 1150-1220 PT Time Calculation (min) (ACUTE ONLY): 30 min  Charges:  $Therapeutic Exercise: 8-22 mins $Therapeutic Activity: 8-22 mins                    G Codes:      Eric [name] Mar 04, 2016, 1:59 PM Eric [name], [place] 03/04/16

## 2016-02-16 NOTE — Progress Notes (Signed)
Patient Name: Eric [name] Date of Encounter: 02/16/2016  Principal Problem:   Cardiac arrest [facility]) Active Problems:   Chronic systolic heart failure ([place])   Hyperlipidemia   Left bundle branch block   Obesity (BMI 30-39.9)   Atrial flutter ([place])   Acute respiratory failure ([place])   Encounter for central line placement   Arrhythmia   Altered mental status   HCAP (healthcare-associated pneumonia)   AKI (acute kidney injury) ([place])   Ventricular fibrillation (HCC)   Aortic aneurysm without rupture (HCC)   Aortic insufficiency   Back pain   Leg weakness, bilateral   Paroxysmal atrial fibrillation (H[facility])   Pneumonia   Acute lumbar back pain   Non-traumatic rhabdomyolysis   Retroperitoneal bleed   Weakness of both lower extremities   Lower extremity weakness   Femoral neuropathy   Length of Stay: 15  SUBJECTIVE Feeling better, having left lower ext swelling. Ate at side of bed yesterday.   Tele: sinus rhythm, HR 88   CURRENT MEDS . amiodarone  400 mg Oral Daily  . carvedilol  37.5 mg Oral BID WC  . docusate sodium  100 mg Oral BID  . feeding supplement (ENSURE ENLIVE)  237 mL Oral BID BM  . insulin aspart  0-5 Units Subcutaneous QHS  . insulin aspart  0-9 Units Subcutaneous TID WC  . isosorbide-hydrALAZINE  0.5 tablet Oral TID  . levothyroxine  75 mcg Oral QAC breakfast  . polyethylene glycol  17 g Oral Daily    OBJECTIVE   Intake/Output Summary (Last 24 hours) at 02/16/16 0742 Last data filed at 02/16/16 0300  Gross per 24 hour  Intake   1140 ml  Output   1325 ml  Net   -185 ml   Filed Weights   02/14/16 0318 02/15/16 0316 02/16/16 0341  Weight: 243 lb 9.7 oz (110.5 kg) 250 lb 3.6 oz (113.5 kg) 256 lb 6.3 oz (116.3 kg)    PHYSICAL EXAM Filed Vitals:   02/16/16 0300 02/16/16 0341 02/16/16 0400 02/16/16 0500  BP:  128/73 128/73   Pulse: 96 86 86 84  Temp:  98.4 F (36.9 C) 98.4 F (36.9 C)   TempSrc:  Oral Oral   Resp: 17 14 21 13   Height:       Weight:  256 lb 6.3 oz (116.3 kg)    SpO2: 99% 99% 98% 99%   General: Alert, oriented x3, no distress Head: EOMI, moist mucous membranes Pulm: clear to auscultation, no crackles or rhonchi Cardiovascular: RRR, no murmurs or gallops Abdomen: no tenderness or distention, no masses by palpation, no abnormal pulsatility or arterial bruits, normal bowel sounds, no hepatosplenomegaly Extremities: no clubbing, cyanosis or 1+ edema LLE Neurological: CN 2-12 grossly intact Skin: warm and dry  LABS  CBC  Recent Labs  02/14/16 0310 02/15/16 0222 02/16/16 0532  WBC 20.4* 17.3* 15.4*  NEUTROABS 15.7*  --   --   HGB 8.7* 9.0* 9.2*  HCT 27.4* 28.4* 28.9*  MCV 95.5 93.7 94.4  PLT 229 213 [id number]   Basic Metabolic Panel  Recent Labs  02/14/16 0310 02/15/16 0222 02/16/16 0532  NA 143 137  --   K 4.1 4.1  --   CL 112* 108  --   CO2 26 22  --   GLUCOSE 141* 155*  --   BUN 51* 42*  --   CREATININE 1.83* 1.05  --   CALCIUM 8.3* 8.3*  --   MG 2.7* 2.3 2.2  PHOS 2.3*  --   --  Liver Function Tests No results for input(s): AST, ALT, ALKPHOS, BILITOT, PROT, ALBUMIN in the last 72 hours. No results for input(s): LIPASE, AMYLASE in the last 72 hours. Cardiac Enzymes  Recent Labs  02/14/16 0310 02/15/16 0222 02/16/16 0532  CKTOTAL 2121* [id number]* 42*    Radiology Studies CT ABDOMEN AND PELVIS WITHOUT CONTRAST  TECHNIQUE: Multidetector CT imaging of the abdomen and pelvis was performed following the standard protocol without IV contrast.  COMPARISON: MRI of the lumbar spine performed 02/11/2016  FINDINGS: Minimal bibasilar atelectasis is noted.  The liver and spleen are unremarkable in appearance. The gallbladder is within normal limits. The pancreas and adrenal glands are unremarkable.  Nonspecific perinephric stranding is noted bilaterally. A 1.5 cm cyst is noted at the lower pole of the right kidney. The kidneys are otherwise unremarkable. There is no evidence of  hydronephrosis. No renal or ureteral stones are identified.  No free fluid is identified. The small bowel is unremarkable in appearance. The stomach is within normal limits. No acute vascular abnormalities are seen. There is mild ectasia of the distal abdominal aorta and common iliac arteries bilaterally, without evidence of significant aneurysmal dilatation.  The appendix is normal in caliber and contains contrast, without evidence of appendicitis. The colon is grossly unremarkable in appearance.  The bladder is mildly distended and grossly unremarkable. The prostate is borderline normal in size. No inguinal lymphadenopathy is seen.  Hemorrhage is noted along the psoas musculature bilaterally, and along the iliacus musculature, with overlying hemorrhage, more prominent on the left. Mild hemorrhage is also seen tracking into the proximal lower extremities bilaterally. Mild soft tissue inflammation is noted overlying the psoas musculature.  No acute osseous abnormalities are identified. Facet disease is noted at the lower lumbar spine.  IMPRESSION: 1. Hemorrhage along the psoas musculature bilaterally, and along the iliacus musculature, with overlying hemorrhage, more prominent on the left. Mild hemorrhage tracking into the proximal lower extremities bilaterally. Mild soft tissue inflammation overlying the psoas musculature. 2. Small left renal cyst noted. 3. Mild ectasia of the distal abdominal aorta and common iliac arteries bilaterally, without significant aneurysmal dilatation.   Electronically Signed  By: Garald Balding M.D.  On: 02/14/2016 04:07     ASSESSMENT AND PLAN  1. S/P cardiac arrest, presumed VT/VF VF on AED interrogation. Plan for CRT-D on this admission, secondary prevention. Normal electrolytes on admission. Moderate to severely depressed LVEF despite chronic comprehensive medical treatment. - cardiac cath potentially tomorrow, BMET  ordered  2. Chronic systolic heart failure-TTE 5/17 reveals EF of 99991111, grade 2 diastolic dysfunction. Holding lasix due to AKI. Volume status looks normal. Continue carvedilol and Bidil. Can start ACEi/ARB pending renal function s/p cath procedure  3. 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 TEE 02/08/16 revealed moderate aortic regurgitation with eccentric jet. Aortic root 5.2cm. Consider CT in 6 months.  4. Atrial fibrillation-- now in sinus rhythm. On home coreg 25mg  BID. Off anticoagulants due to retroperitoneal bleeding.  Continue amiodarone po 400mg  daily.   5. Hypertension- controlled, continue coreg, resumed bidil 20-37.5mg  yesterday. BP runs low at times- will not increase dose yet.  6. Aortic root enlargement - aortic root measures 32mm on TEE -May benefit CT Angio when renal function is clearly stabilized.  7. 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 Left bundle branch block typical LBBB with QRS duration 125ms predicts response to CRT.  9. Hyperlipidemia - will resume statin when  rhabdo resolves.   10. Possible aspiration pneumonia- completed course of abx. CXR 5/18 neg for infection.   11. Leukocytosis -- likely due to acute retroperitoneal bleed, resolving. Afebrile overnight  12. Anemia-- hgb 7.1 previously from 9.5 secondary to retroperitoneal bleed, discovered by MRI back.  - Status post 2 units of blood with diuretics  - Continue to hold heparin IV (was placed in the setting of cardioversion) - consider iron supplements.    Julious Oka, MD   02/16/2016 7:42 AM Patient seen and examined and history reviewed. Agree with above findings and plan. Feels well. Still has leg weakness. No chest pain or SOB. Mild pedal edema noted on exam.  CK continues to decline. Creatinine down to 1.6 Hgb up slightly to 9.2.  If renal function stable will plan on proceeding with  cardiac cath tomorrow with coronary angiography only to limit dye load. Will not hydrate due to CHF but if EDP is OK we could hydrate more post cath.  I notified EP to see about timing of ICD/CRT. If he does have significant CAD we will need to step back and re-evaluate.  Karin Griffith [place], [place] 02/16/2016 10:41 AM

## 2016-02-16 NOTE — Progress Notes (Signed)
Patient ID: Eric [name], male   DOB: March 02, 1951, 65 y.o.   MRN: [id number]  [facility] Progress Note   Assessment/ Plan:   1. AKI on chronic kidney disease stage IV (baseline creatinine 1.8-2.0): Nonoliguric. Renal function back to baseline after what appears to have been renal insufficiency that was probably hemodynamically mediated with cardiac arrest and from rhabdomyolysis. Lab from this morning is still pending. Anticipate iodinated intravenous contrast exposure for coronary angiogram possibly tomorrow. 2. Rhabdomyolysis: CPK levels trending downwards-currently 700 down from 1500. 3. Cardiac arrest: Presumed V. tach/V. fib with V. fib seen on AED interrogation. Anticipated to undergo further evaluation with coronary angiogram-- will be seen by EP as an outpatient and likely undergo device change. 4. Chronic systolic heart failure: EF of 30-35%. Furosemide currently on hold. This may limit the aggressiveness of intravenous fluid therapy pre-and post coronary angiogram. 5. Hypertension: Blood pressure currently acceptable-continue to monitor especially if needs isotonic fluids.  Subjective:   Reports that he still has right lower extremity weakness-left leg somewhat stronger. Denies any chest pain or shortness of breath.    Objective:   BP 128/73 mmHg  Pulse 84  Temp(Src) 98.4 F (36.9 C) (Oral)  Resp 13  Ht 5\' 9"  (1.753 m)  Wt 116.3 kg (256 lb 6.3 oz)  BMI 37.85 kg/m2  SpO2 99%  Intake/Output Summary (Last 24 hours) at 02/16/16 0913 Last data filed at 02/16/16 0300  Gross per 24 hour  Intake    900 ml  Output   1325 ml  Net   -425 ml   Weight change: 2.8 kg (6 lb 2.8 oz)  Physical Exam: [id number] resting in bed eating breakfast CVS: Pulse regular rhythm, normal rate, S1 and S2 normal. Resp: Clear to auscultation bilaterally-no rales/rhonchi Abd: Soft, flat, nontender Ext: No lower extremity edema  Imaging: No results  found.  Labs: BMET  Recent Labs Lab 02/10/16 0250 02/11/16 0406 02/12/16 0428 02/13/16 0322 02/14/16 0310 02/15/16 0222  NA 144 144 145 143 143 137  K 4.2 3.9 4.1 4.0 4.1 4.1  CL 109 107 105 107 112* 108  CO2 21* 25 27 27 26 22   GLUCOSE 121* 160* 166* 179* 141* 155*  BUN 98* 116* 100* 72* 51* 42*  CREATININE 3.15* 3.53* 2.80* 2.22* 1.83* 1.05  CALCIUM 8.6* 8.3* 8.2* 8.3* 8.3* 8.3*  PHOS 5.5*  --   --   --  2.3*  --    CBC  Recent Labs Lab 02/11/16 1930  02/12/16 0428  02/13/16 0322  02/13/16 1200 02/14/16 0310 02/15/16 0222 02/16/16 0532  WBC 24.3*  --  23.3*  --  22.1*  --   --  20.4* 17.3* 15.4*  NEUTROABS 19.7*  --  17.9*  --  17.3*  --   --  15.7*  --   --   HGB 9.1*  < > 8.6*  < > 8.6*  < > 8.8* 8.7* 9.0* 9.2*  HCT 27.8*  < > 26.3*  < > 27.4*  < > 28.0* 27.4* 28.4* 28.9*  MCV 90.6  --  91.0  --  94.2  --   --  95.5 93.7 94.4  PLT 294  --  277  --  262  --   --  229 213 222  < > = values in this interval not displayed.  Medications:    . amiodarone  400 mg Oral Daily  . carvedilol  37.5 mg Oral BID WC  . docusate sodium  100  mg Oral BID  . feeding supplement (ENSURE ENLIVE)  237 mL Oral BID BM  . insulin aspart  0-5 Units Subcutaneous QHS  . insulin aspart  0-9 Units Subcutaneous TID WC  . isosorbide-hydrALAZINE  0.5 tablet Oral TID  . levothyroxine  75 mcg Oral QAC breakfast  . polyethylene glycol  17 g Oral Daily   Elmarie Shiley, MD 02/16/2016, 9:13 AM

## 2016-02-17 ENCOUNTER — Encounter (HOSPITAL_COMMUNITY)
Admission: EM | Disposition: A | Discharge status: Rehabilitation Facility | Payer: Self-pay | Source: Home / Self Care | Attending: Emergency Medicine

## 2016-02-17 ENCOUNTER — Other Ambulatory Visit: Payer: Self-pay

## 2016-02-17 DIAGNOSIS — I4901 Ventricular fibrillation: Secondary | ICD-10-CM

## 2016-02-17 DIAGNOSIS — I2510 Atherosclerotic heart disease of native coronary artery without angina pectoris: Secondary | ICD-10-CM

## 2016-02-17 DIAGNOSIS — J9601 Acute respiratory failure with hypoxia: Secondary | ICD-10-CM | POA: Diagnosis not present

## 2016-02-17 DIAGNOSIS — I472 Ventricular tachycardia: Secondary | ICD-10-CM | POA: Diagnosis not present

## 2016-02-17 DIAGNOSIS — I255 Ischemic cardiomyopathy: Secondary | ICD-10-CM | POA: Diagnosis not present

## 2016-02-17 DIAGNOSIS — I5022 Chronic systolic (congestive) heart failure: Secondary | ICD-10-CM

## 2016-02-17 DIAGNOSIS — G9340 Encephalopathy, unspecified: Secondary | ICD-10-CM | POA: Diagnosis not present

## 2016-02-17 HISTORY — PX: CARDIAC CATHETERIZATION: SHX172

## 2016-02-17 HISTORY — PX: EP IMPLANTABLE DEVICE: SHX172B

## 2016-02-17 LAB — GLUCOSE, CAPILLARY
GLUCOSE-CAPILLARY: 125 mg/dL — AB (ref 65–99)
GLUCOSE-CAPILLARY: 129 mg/dL — AB (ref 65–99)
GLUCOSE-CAPILLARY: 126 mg/dL — AB (ref 65–99)
GLUCOSE-CAPILLARY: 128 mg/dL — AB (ref 65–99)

## 2016-02-17 LAB — RENAL FUNCTION PANEL
ALBUMIN: 2.3 g/dL — AB (ref 3.5–5.0)
Anion gap: 5 (ref 5–15)
BUN: 32 mg/dL — AB (ref 6–20)
CALCIUM: 8.4 mg/dL — AB (ref 8.9–10.3)
CO2: 21 mmol/L — ABNORMAL LOW (ref 22–32)
CREATININE: 1.55 mg/dL — AB (ref 0.61–1.24)
Chloride: 107 mmol/L (ref 101–111)
GFR calc Af Amer: 53 mL/min — ABNORMAL LOW (ref >60)
GFR, EST NON AFRICAN AMERICAN: 46 mL/min — AB (ref >60)
Glucose, Bld: 118 mg/dL — ABNORMAL HIGH (ref 65–99)
PHOSPHORUS: 2.7 mg/dL (ref 2.5–4.6)
Potassium: 4.3 mmol/L (ref 3.5–5.1)
SODIUM: 133 mmol/L — AB (ref 135–145)

## 2016-02-17 LAB — HEPATIC FUNCTION PANEL
ALT: 41 U/L (ref 17–63)
AST: 64 U/L — ABNORMAL HIGH (ref 15–41)
Albumin: 2.2 g/dL — ABNORMAL LOW (ref 3.5–5.0)
Alkaline Phosphatase: 60 U/L (ref 38–126)
BILIRUBIN INDIRECT: 1.3 mg/dL — AB (ref 0.3–0.9)
Bilirubin, Direct: 0.3 mg/dL (ref 0.1–0.5)
TOTAL PROTEIN: 5.9 g/dL — AB (ref 6.5–8.1)
Total Bilirubin: 1.6 mg/dL — ABNORMAL HIGH (ref 0.3–1.2)

## 2016-02-17 LAB — MAGNESIUM: MAGNESIUM: 2.1 mg/dL (ref 1.7–2.4)

## 2016-02-17 LAB — CBC
HCT: 29 % — ABNORMAL LOW (ref 39.0–52.0)
HCT: 31.6 % — ABNORMAL LOW (ref 39.0–52.0)
Hemoglobin: 9.2 g/dL — ABNORMAL LOW (ref 13.0–17.0)
Hemoglobin: 10 g/dL — ABNORMAL LOW (ref 13.0–17.0)
MCH: 30 pg (ref 26.0–34.0)
MCH: 29.7 pg (ref 26.0–34.0)
MCHC: 31.7 g/dL (ref 30.0–36.0)
MCHC: 31.6 g/dL (ref 30.0–36.0)
MCV: 94.5 fL (ref 78.0–100.0)
MCV: 93.8 fL (ref 78.0–100.0)
PLATELETS: 218 10*3/uL (ref 150–400)
PLATELETS: 233 10*3/uL (ref 150–400)
RBC: 3.07 MIL/uL — ABNORMAL LOW (ref 4.22–5.81)
RBC: 3.37 MIL/uL — AB (ref 4.22–5.81)
RDW: 14.9 % (ref 11.5–15.5)
RDW: 14.9 % (ref 11.5–15.5)
WBC: 14.3 10*3/uL — AB (ref 4.0–10.5)
WBC: 9.8 10*3/uL (ref 4.0–10.5)

## 2016-02-17 LAB — CREATININE, SERUM
CREATININE: 1.49 mg/dL — AB (ref 0.61–1.24)
GFR calc non Af Amer: 48 mL/min — ABNORMAL LOW (ref >60)
GFR, EST AFRICAN AMERICAN: 55 mL/min — AB (ref >60)

## 2016-02-17 LAB — SURGICAL PCR SCREEN
MRSA, PCR: NEGATIVE
STAPHYLOCOCCUS AUREUS: POSITIVE — AB

## 2016-02-17 SURGERY — LEFT HEART CATH AND CORONARY ANGIOGRAPHY
Anesthesia: LOCAL

## 2016-02-17 SURGERY — BIV ICD INSERTION CRT-D

## 2016-02-17 MED ORDER — CEFAZOLIN SODIUM-DEXTROSE 2-4 GM/100ML-% IV SOLN
2.0000 g | INTRAVENOUS | Status: AC
  Administered 2016-02-17: 2 g via INTRAVENOUS

## 2016-02-17 MED ORDER — LIDOCAINE HCL (PF) 1 % IJ SOLN
INTRAMUSCULAR | Status: AC
  Filled 2016-02-17: qty 30

## 2016-02-17 MED ORDER — CEFAZOLIN SODIUM 1-5 GM-% IV SOLN
1.0000 g | Freq: Four times a day (QID) | INTRAVENOUS | Status: AC
  Administered 2016-02-17 – 2016-02-18 (×3): 1 g via INTRAVENOUS
  Filled 2016-02-17 (×3): qty 50

## 2016-02-17 MED ORDER — IOPAMIDOL (ISOVUE-370) INJECTION 76%
INTRAVENOUS | Status: AC
  Filled 2016-02-17: qty 50

## 2016-02-17 MED ORDER — SODIUM CHLORIDE 0.9% FLUSH
3.0000 mL | Freq: Two times a day (BID) | INTRAVENOUS | Status: DC
  Administered 2016-02-18: 3 mL via INTRAVENOUS

## 2016-02-17 MED ORDER — HEPARIN (PORCINE) IN NACL 2-0.9 UNIT/ML-% IJ SOLN
INTRAMUSCULAR | Status: DC | PRN
  Administered 2016-02-17: 16:00:00

## 2016-02-17 MED ORDER — LIDOCAINE HCL (PF) 1 % IJ SOLN
INTRAMUSCULAR | Status: DC | PRN
  Administered 2016-02-17: 2 mL

## 2016-02-17 MED ORDER — SODIUM CHLORIDE 0.9% FLUSH: 3.0000 mL | INTRAVENOUS | Status: DC | PRN

## 2016-02-17 MED ORDER — SODIUM CHLORIDE 0.9 % IR SOLN
Status: AC
  Filled 2016-02-17: qty 2

## 2016-02-17 MED ORDER — SODIUM CHLORIDE 0.9 % IV SOLN
250.0000 mL | INTRAVENOUS | Status: DC
  Administered 2016-02-17: 250 mL via INTRAVENOUS

## 2016-02-17 MED ORDER — CHLORHEXIDINE GLUCONATE 4 % EX LIQD: 60.0000 mL | Freq: Once | CUTANEOUS | Status: DC

## 2016-02-17 MED ORDER — ACETAMINOPHEN 325 MG PO TABS
325.0000 mg | ORAL_TABLET | ORAL | Status: DC | PRN
  Administered 2016-02-19 – 2016-02-21 (×3): 650 mg via ORAL
  Filled 2016-02-17 (×3): qty 2

## 2016-02-17 MED ORDER — HEPARIN SODIUM (PORCINE) 1000 UNIT/ML IJ SOLN
INTRAMUSCULAR | Status: AC
  Filled 2016-02-17: qty 1

## 2016-02-17 MED ORDER — MIDAZOLAM HCL 5 MG/5ML IJ SOLN
INTRAMUSCULAR | Status: AC
  Filled 2016-02-17: qty 5

## 2016-02-17 MED ORDER — SODIUM CHLORIDE 0.9 % IR SOLN
80.0000 mg | Status: DC
  Filled 2016-02-17: qty 2

## 2016-02-17 MED ORDER — VERAPAMIL HCL 2.5 MG/ML IV SOLN
INTRAVENOUS | Status: AC
  Filled 2016-02-17: qty 2

## 2016-02-17 MED ORDER — SODIUM CHLORIDE 0.9 % IR SOLN
80.0000 mg | Status: AC
  Administered 2016-02-17: 80 mg

## 2016-02-17 MED ORDER — VERAPAMIL HCL 2.5 MG/ML IV SOLN
INTRAVENOUS | Status: DC | PRN
  Administered 2016-02-17: 10 mL via INTRA_ARTERIAL

## 2016-02-17 MED ORDER — MIDAZOLAM HCL 2 MG/2ML IJ SOLN
INTRAMUSCULAR | Status: AC
  Filled 2016-02-17: qty 2

## 2016-02-17 MED ORDER — LIDOCAINE HCL (PF) 1 % IJ SOLN
INTRAMUSCULAR | Status: DC | PRN
  Administered 2016-02-17: 50 mL

## 2016-02-17 MED ORDER — HEPARIN (PORCINE) IN NACL 2-0.9 UNIT/ML-% IJ SOLN
INTRAMUSCULAR | Status: AC
  Filled 2016-02-17: qty 500

## 2016-02-17 MED ORDER — SODIUM CHLORIDE 0.9 % IV SOLN: 250.0000 mL | INTRAVENOUS | Status: DC | PRN

## 2016-02-17 MED ORDER — MIDAZOLAM HCL 5 MG/5ML IJ SOLN
INTRAMUSCULAR | Status: DC | PRN
  Administered 2016-02-17 (×3): 1 mg via INTRAVENOUS
  Administered 2016-02-17: 2 mg via INTRAVENOUS
  Administered 2016-02-17: 1 mg via INTRAVENOUS

## 2016-02-17 MED ORDER — HEPARIN SODIUM (PORCINE) 1000 UNIT/ML IJ SOLN
INTRAMUSCULAR | Status: DC | PRN
  Administered 2016-02-17: 3000 [IU] via INTRAVENOUS

## 2016-02-17 MED ORDER — FENTANYL CITRATE (PF) 100 MCG/2ML IJ SOLN
INTRAMUSCULAR | Status: AC
  Filled 2016-02-17: qty 2

## 2016-02-17 MED ORDER — SODIUM CHLORIDE 0.9% FLUSH
3.0000 mL | Freq: Two times a day (BID) | INTRAVENOUS | Status: DC
  Administered 2016-02-17: 3 mL via INTRAVENOUS

## 2016-02-17 MED ORDER — SODIUM CHLORIDE 0.9 % IV SOLN
250.0000 mL | INTRAVENOUS | Status: DC | PRN
Start: 2016-02-17 — End: 2016-02-18

## 2016-02-17 MED ORDER — IOPAMIDOL (ISOVUE-370) INJECTION 76%
INTRAVENOUS | Status: DC | PRN
  Administered 2016-02-17: 70 mL

## 2016-02-17 MED ORDER — FENTANYL CITRATE (PF) 100 MCG/2ML IJ SOLN
INTRAMUSCULAR | Status: DC | PRN
  Administered 2016-02-17: 25 ug via INTRAVENOUS
  Administered 2016-02-17: 50 ug via INTRAVENOUS
  Administered 2016-02-17: 25 ug via INTRAVENOUS

## 2016-02-17 MED ORDER — FENTANYL CITRATE (PF) 100 MCG/2ML IJ SOLN
INTRAMUSCULAR | Status: DC | PRN
  Administered 2016-02-17 (×2): 12.5 ug via INTRAVENOUS
  Administered 2016-02-17: 25 ug via INTRAVENOUS
  Administered 2016-02-17 (×2): 12.5 ug via INTRAVENOUS

## 2016-02-17 MED ORDER — ONDANSETRON HCL 4 MG/2ML IJ SOLN: 4.0000 mg | Freq: Four times a day (QID) | INTRAMUSCULAR | Status: DC | PRN

## 2016-02-17 MED ORDER — ACETAMINOPHEN 325 MG PO TABS: 650.0000 mg | ORAL_TABLET | ORAL | Status: DC | PRN

## 2016-02-17 MED ORDER — SODIUM CHLORIDE 0.9 % IV SOLN: INTRAVENOUS | Status: DC

## 2016-02-17 MED ORDER — MIDAZOLAM HCL 2 MG/2ML IJ SOLN
INTRAMUSCULAR | Status: DC | PRN
  Administered 2016-02-17 (×2): 1 mg via INTRAVENOUS

## 2016-02-17 MED ORDER — HEPARIN SODIUM (PORCINE) 5000 UNIT/ML IJ SOLN
5000.0000 [IU] | Freq: Three times a day (TID) | INTRAMUSCULAR | Status: DC
  Administered 2016-02-18 – 2016-02-22 (×13): 5000 [IU] via SUBCUTANEOUS
  Filled 2016-02-17 (×13): qty 1

## 2016-02-17 MED ORDER — CHLORHEXIDINE GLUCONATE 4 % EX LIQD
60.0000 mL | Freq: Once | CUTANEOUS | Status: DC
  Filled 2016-02-17: qty 30

## 2016-02-17 MED ORDER — SODIUM CHLORIDE 0.9 % IV SOLN
INTRAVENOUS | Status: DC
  Administered 2016-02-17: 06:00:00 via INTRAVENOUS

## 2016-02-17 MED ORDER — IOPAMIDOL (ISOVUE-370) INJECTION 76%
INTRAVENOUS | Status: AC
  Filled 2016-02-17: qty 100

## 2016-02-17 MED ORDER — HEPARIN (PORCINE) IN NACL 2-0.9 UNIT/ML-% IJ SOLN
INTRAMUSCULAR | Status: DC | PRN
  Administered 2016-02-17: 1500 mL

## 2016-02-17 MED ORDER — CEFAZOLIN SODIUM-DEXTROSE 2-4 GM/100ML-% IV SOLN: 2.0000 g | INTRAVENOUS | Status: DC

## 2016-02-17 MED ORDER — SODIUM CHLORIDE 0.9 % IV SOLN: INTRAVENOUS | Status: AC

## 2016-02-17 MED ORDER — HEPARIN (PORCINE) IN NACL 2-0.9 UNIT/ML-% IJ SOLN
INTRAMUSCULAR | Status: AC
  Filled 2016-02-17: qty 1500

## 2016-02-17 MED ORDER — CEFAZOLIN SODIUM-DEXTROSE 2-4 GM/100ML-% IV SOLN
INTRAVENOUS | Status: AC
  Filled 2016-02-17: qty 100

## 2016-02-17 MED ORDER — IOPAMIDOL (ISOVUE-370) INJECTION 76%
INTRAVENOUS | Status: DC | PRN
  Administered 2016-02-17: 30 mL via INTRAVENOUS

## 2016-02-17 SURGICAL SUPPLY — 18 items
ASSURA CRTD CD3369-40C (ICD Generator) ×3 IMPLANT
CABLE SURGICAL S-101-97-12 (CABLE) ×6 IMPLANT
CATH CPS DIRECT 135 DS2C020 (CATHETERS) ×3 IMPLANT
CATH CPS DIRECT 135 DS2C027 (CATHETERS) ×3 IMPLANT
CATH HEX JOSEPH 2-5-2 65CM 6F (CATHETERS) ×3 IMPLANT
CPS IMPLANT KIT 410190 (MISCELLANEOUS) ×3 IMPLANT
DEFIB ASSURA CRT-D (ICD Generator) ×1 IMPLANT
GUIDEWIRE ANGLED .035X150CM (WIRE) ×3 IMPLANT
KIT ESSENTIALS PG (KITS) ×3 IMPLANT
LEAD DURATA 7122-65CM (Lead) ×3 IMPLANT
LEAD QUARTET 1458Q-86CM (Lead) ×3 IMPLANT
LEAD TENDRIL MRI 52CM LPA1200M (Lead) ×3 IMPLANT
PAD DEFIB LIFELINK (PAD) ×3 IMPLANT
SHEATH CLASSIC 8F (SHEATH) ×6 IMPLANT
SLITTER UNIVERSAL DS2A003 (MISCELLANEOUS) ×3 IMPLANT
TRAY PACEMAKER INSERTION (PACKS) ×3 IMPLANT
WIRE ACUITY WHISPER EDS 4648 (WIRE) ×3 IMPLANT
WIRE HI TORQ VERSACORE-J 145CM (WIRE) ×3 IMPLANT

## 2016-02-17 SURGICAL SUPPLY — 12 items
CATH INFINITI 5 FR 3DRC (CATHETERS) ×2 IMPLANT
CATH INFINITI 5 FR AL2 (CATHETERS) ×2 IMPLANT
CATH INFINITI 5 FR JL3.5 (CATHETERS) ×2 IMPLANT
CATH INFINITI 5FR JL4 (CATHETERS) ×2 IMPLANT
CATH INFINITI JR4 5F (CATHETERS) ×2 IMPLANT
DEVICE RAD COMP TR BAND LRG (VASCULAR PRODUCTS) ×2 IMPLANT
GLIDESHEATH SLEND SS 6F .021 (SHEATH) ×2 IMPLANT
KIT HEART LEFT (KITS) ×2 IMPLANT
PACK CARDIAC CATHETERIZATION (CUSTOM PROCEDURE TRAY) ×2 IMPLANT
TRANSDUCER W/STOPCOCK (MISCELLANEOUS) ×2 IMPLANT
TUBING CIL FLEX 10 FLL-RA (TUBING) ×2 IMPLANT
WIRE SAFE-T 1.5MM-J .035X260CM (WIRE) ×2 IMPLANT

## 2016-02-17 NOTE — H&P (View-Only) (Signed)
Patient Name: Guhan Chizmar Date of Encounter: 02/17/2016  Principal Problem:   Cardiac arrest [facility]) Active Problems:   Chronic systolic heart failure ([place])   Hyperlipidemia   Left bundle branch block   Obesity (BMI 30-39.9)   Atrial flutter ([place])   Acute respiratory failure ([place])   Encounter for central line placement   Arrhythmia   Altered mental status   HCAP (healthcare-associated pneumonia)   AKI (acute kidney injury) ([place])   Ventricular fibrillation (HCC)   Aortic aneurysm without rupture (HCC)   Aortic insufficiency   Back pain   Leg weakness, bilateral   Paroxysmal atrial fibrillation (H[facility])   Pneumonia   Acute lumbar back pain   Non-traumatic rhabdomyolysis   Retroperitoneal bleed   Weakness of both lower extremities   Lower extremity weakness   Femoral neuropathy   Length of Stay: 16  SUBJECTIVE No complaints, still have lower back pain. Worked w/ PT yesterday.    Tele: sinus rhythm, HR 84   CURRENT MEDS . amiodarone  400 mg Oral Daily  . carvedilol  37.5 mg Oral BID WC  .  ceFAZolin (ANCEF) IV  2 g Intravenous To SSTC  . chlorhexidine  60 mL Topical Once  . chlorhexidine  60 mL Topical Once  . docusate sodium  100 mg Oral BID  . feeding supplement (ENSURE ENLIVE)  237 mL Oral BID BM  . gentamicin irrigation  80 mg Irrigation To SSTC  . insulin aspart  0-5 Units Subcutaneous QHS  . insulin aspart  0-9 Units Subcutaneous TID WC  . isosorbide-hydrALAZINE  0.5 tablet Oral TID  . levothyroxine  75 mcg Oral QAC breakfast  . polyethylene glycol  17 g Oral Daily  . sodium chloride flush  3 mL Intravenous Q12H  . sodium chloride flush  3 mL Intravenous Q12H    OBJECTIVE   Intake/Output Summary (Last 24 hours) at 02/17/16 0745 Last data filed at 02/17/16 0600  Gross per 24 hour  Intake    840 ml  Output    650 ml  Net    190 ml   Filed Weights   02/15/16 0316 02/16/16 0341 02/17/16 0408  Weight: 250 lb 3.6 oz (113.5 kg) 256 lb 6.3 oz (116.3 kg)  249 lb 5.4 oz (113.1 kg)    PHYSICAL EXAM Filed Vitals:   02/17/16 0408 02/17/16 0500 02/17/16 0515 02/17/16 0600  BP: 160/90  160/90   Pulse: 83 86  103  Temp: 99.1 F (37.3 C)     TempSrc: Oral     Resp: 18 16  19   Height:      Weight: 249 lb 5.4 oz (113.1 kg)     SpO2: 99% 99%  99%   General: Alert, oriented x3, no distress Head: EOMI, moist mucous membranes Pulm: clear to auscultation, no crackles or rhonchi Cardiovascular: RRR, no murmurs or gallops Abdomen: no tenderness or distention, no masses by palpation, no abnormal pulsatility or arterial bruits, normal bowel sounds, no hepatosplenomegaly Extremities: no clubbing, cyanosis or 1+ edema b/l Neurological: CN 2-12 grossly intact Skin: warm and dry  LABS  CBC  Recent Labs  02/16/16 0532 02/17/16 0337  WBC 15.4* 14.3*  HGB 9.2* 9.2*  HCT 28.9* 29.0*  MCV 94.4 94.5  PLT 222 99991111   Basic Metabolic Panel  Recent Labs  02/16/16 0532 02/17/16 0337  NA 135 133*  K 4.2 4.3  CL 108 107  CO2 20* 21*  GLUCOSE 130* 118*  BUN 38* 32*  CREATININE  1.60* 1.55*  CALCIUM 8.4* 8.4*  MG 2.2 2.1  PHOS  --  2.7   Liver Function Tests  Recent Labs  02/17/16 0337  AST 64*  ALT 41  ALKPHOS 60  BILITOT 1.6*  PROT 5.9*  ALBUMIN 2.2*  2.3*   Cardiac Enzymes  Recent Labs  02/15/16 0222 02/16/16 0532  CKTOTAL 1474* 776*    Radiology Studies CT ABDOMEN AND PELVIS WITHOUT CONTRAST  TECHNIQUE: Multidetector CT imaging of the abdomen and pelvis was performed following the standard protocol without IV contrast.  COMPARISON: MRI of the lumbar spine performed 02/11/2016  FINDINGS: Minimal bibasilar atelectasis is noted.  The liver and spleen are unremarkable in appearance. The gallbladder is within normal limits. The pancreas and adrenal glands are unremarkable.  Nonspecific perinephric stranding is noted bilaterally. A 1.5 cm cyst is noted at the lower pole of the right kidney. The kidneys  are otherwise unremarkable. There is no evidence of hydronephrosis. No renal or ureteral stones are identified.  No free fluid is identified. The small bowel is unremarkable in appearance. The stomach is within normal limits. No acute vascular abnormalities are seen. There is mild ectasia of the distal abdominal aorta and common iliac arteries bilaterally, without evidence of significant aneurysmal dilatation.  The appendix is normal in caliber and contains contrast, without evidence of appendicitis. The colon is grossly unremarkable in appearance.  The bladder is mildly distended and grossly unremarkable. The prostate is borderline normal in size. No inguinal lymphadenopathy is seen.  Hemorrhage is noted along the psoas musculature bilaterally, and along the iliacus musculature, with overlying hemorrhage, more prominent on the left. Mild hemorrhage is also seen tracking into the proximal lower extremities bilaterally. Mild soft tissue inflammation is noted overlying the psoas musculature.  No acute osseous abnormalities are identified. Facet disease is noted at the lower lumbar spine.  IMPRESSION: 1. Hemorrhage along the psoas musculature bilaterally, and along the iliacus musculature, with overlying hemorrhage, more prominent on the left. Mild hemorrhage tracking into the proximal lower extremities bilaterally. Mild soft tissue inflammation overlying the psoas musculature. 2. Small left renal cyst noted. 3. Mild ectasia of the distal abdominal aorta and common iliac arteries bilaterally, without significant aneurysmal dilatation.   Electronically Signed  By: Garald Balding M.D.  On: 02/14/2016 04:07    ASSESSMENT AND PLAN  1. S/P cardiac arrest, presumed VT/VF VF on AED interrogation. Labs stable. Cardiac cath at 10:30am this morning. Plan for CRT-D as he has HF ad LBBB for secondary prevention pending results of cath today.   2. Chronic systolic heart  failure-TTE 5/17 reveals EF of 99991111, grade 2 diastolic dysfunction. Holding lasix due to AKI. Volume status looks normal. Continue carvedilol and Bidil. Can start ACEi/ARB pending renal function s/p cath procedure  3. 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 TEE 02/08/16 revealed moderate aortic regurgitation with eccentric jet. Aortic root 5.2cm. Consider CT in 6 months.  4. Atrial fibrillation-- now in sinus rhythm. On home coreg 25mg  BID. Off anticoagulants due to retroperitoneal bleeding.  Continue amiodarone po 400mg  daily.   5. Hypertension- controlled, continue coreg, resumed bidil 20-37.5mg  yesterday. BP runs low at times- will not increase dose yet.  6. Aortic root enlargement - aortic root measures 55mm on TEE -May benefit CT Angio when renal function is clearly stabilized.  7. 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 Left bundle branch block typical LBBB with QRS duration  181ms predicts response to CRT.  9. Hyperlipidemia - will resume statin when rhabdo resolves.   10. Possible aspiration pneumonia- completed course of abx. CXR 5/18 neg for infection.   11. Leukocytosis -- likely due to acute retroperitoneal bleed, resolving. Afebrile overnight  12. Anemia-- hgb 7.1 previously from 9.5 secondary to retroperitoneal bleed, discovered by MRI back.  - Status post 2 units of blood with diuretics  - Continue to hold heparin IV (was placed in the setting of cardioversion) - consider iron supplements.    Julious Oka, MD   02/17/2016 7:45 AM  Patient seen and examined and history reviewed. Agree with above findings and plan. Doing well. Some hip pain last night. No dyspnea or chest pain. No arrhythmia. Renal function improved. Creatinine 1.55. Hgb is stable 9.2. Plan to proceed with cardiac cath today- coronaries only. If he has significant CAD will need to postpone  revascularization with stenting until he has fully recovered from his acute illness in several weeks. If no significant CAD plan to proceed with ICD/CRT today with Dr. Lovena Le. Post procedure patient is stable to transfer to telemetry.   Peter Martin[place], [place] 02/17/2016 8:04 AM

## 2016-02-17 NOTE — Progress Notes (Signed)
Nutrition Follow-up  DOCUMENTATION CODES:   Obesity unspecified  INTERVENTION:   -D/c Ensure Enlive po BID, each supplement provides 350 kcal and 20 grams of protein -Encourage adequate PO intake -Monitor intake and supplement diet as appropriate  NUTRITION DIAGNOSIS:   Inadequate oral intake related to dysphagia as evidenced by per patient/family report, meal completion < 25%.  Progressing  GOAL:   Patient will meet greater than or equal to 90% of their needs  Progressing  MONITOR:   PO intake, Labs, Weight trends, Skin, I & O's  REASON FOR ASSESSMENT:   Consult Enteral/tube feeding initiation and management  ASSESSMENT:   Pt with known sCHF and A.fib, presented 05/08 after out of hospital cardiac arrest. ROSC within 15-20 minutes after defib x 2. Hypothermia protocol. Cooling completed.  Pt transferred from ICU to SDU on 02/11/16.   Pt underwent MRI of lumbar spine which revealed retroperitoneal hematoma. He is currently NPO for cardiac cath today.   Pt was advanced to a regular consistency diet with thin liquids by SLP on 02/14/16. Intake is very good; noted 25-100% meal completion. Noted pt has been refusing Ensure supplements over the past 2 days per [facility]. RD will d/c due to poor acceptance.   Case discussed with RN. She confirms pt does not like Ensure, but typically eats well.   CIR admissions team following for potential admission.   Labs reviewed: Na: 133 (on IV supplementation), CBGS: 125.   Diet Order:  Diet heart healthy/carb modified Room service appropriate?: Yes; Fluid consistency:: Thin Diet NPO time specified Except for: Sips with Meds Diet NPO time specified Except for: Sips with Meds  Skin:  Reviewed, no issues  Last BM:  02/17/16  Height:   Ht Readings from Last 1 Encounters:  02/01/16 5\' 9"  (1.753 m)    Weight:   Wt Readings from Last 1 Encounters:  02/17/16 249 lb 5.4 oz (113.1 kg)    Ideal Body Weight:  72.7 kg  BMI:  Body  mass index is 36.8 kg/(m^2).  Estimated Nutritional Needs:   Kcal:  2000-2200  Protein:  95-110 grams  Fluid:  > 2 L/day  EDUCATION NEEDS:   No education needs identified at this time  Daymion Nazaire A. Jimmye Norman, RD, LDN, CDE Pager: [phone] After hours Pager: [phone]

## 2016-02-17 NOTE — Progress Notes (Signed)
Patient ID: Tyrese Zurlo, male   DOB: 02/25/1951, 65 y.o.   MRN: [id number]   [facility] Progress Note   Assessment/ Plan:   1. AKI on chronic kidney disease stage IV (baseline creatinine 1.8-2.0): Nonoliguric. Renal function back to baseline after what appears to have been renal insufficiency that was probably hemodynamically mediated with cardiac arrest and from rhabdomyolysis. Will continue to follow after coronary angiogram today for possible CINP. On NS @60mL /h- conservative rate with low EF.  2. Rhabdomyolysis: CPK levels lower yesterday- 700 down from 7000. No evidence of myositis at this time 3. Cardiac arrest: Presumed V. tach/V. fib with V. fib seen on AED interrogation. Anticipated to undergo further evaluation with coronary angiogram-- will be seen by EP as an outpatient and likely undergo device change. 4. Chronic systolic heart failure: EF of 30-35%. Furosemide currently on hold. On BiDil/Coreg 5. Hypertension: Blood pressure currently acceptable-continue to monitor especially if needs isotonic fluids.  Subjective:   RLE weakness persists, LLE strength better- denies any CP/SOB.    Objective:   BP 141/71 mmHg  Pulse 86  Temp(Src) 97.2 F (36.2 C) (Oral)  Resp 17  Ht 5\' 9"  (1.753 m)  Wt 113.1 kg (249 lb 5.4 oz)  BMI 36.80 kg/m2  SpO2 99%  Intake/Output Summary (Last 24 hours) at 02/17/16 [id number] Last data filed at 02/17/16 0600  Gross per 24 hour  Intake    840 ml  Output    650 ml  Net    190 ml   Weight change: -3.2 kg (-7 lb 0.9 oz)  Physical Exam: [id number] sleeping in bed  CVS: Pulse regular rhythm, normal rate, S1 and S2 normal. Resp: Clear to auscultation bilaterally-no rales/rhonchi Abd: Soft, flat, nontender Ext: No lower extremity edema  Imaging: No results found.  Labs: BMET  Recent Labs Lab 02/11/16 0406 02/12/16 [id number] 02/13/16 0322 02/14/16 0310 02/15/16 0222 02/16/16 0532 02/17/16 0337  NA 144 145 143 143 137 135  133*  K 3.9 4.1 4.0 4.1 4.1 4.2 4.3  CL 107 105 107 112* 108 108 107  CO2 25 27 27 26 22  20* 21*  GLUCOSE 160* 166* 179* 141* 155* 130* 118*  BUN 116* 100* 72* 51* 42* 38* 32*  CREATININE 3.53* 2.80* 2.22* 1.83* 1.05 1.60* 1.55*  CALCIUM 8.3* 8.2* 8.3* 8.3* 8.3* 8.4* 8.4*  PHOS  --   --   --  2.3*  --   --  2.7   CBC  Recent Labs Lab 02/11/16 1930  02/12/16 0428  02/13/16 0322  02/14/16 0310 02/15/16 0222 02/16/16 0532 02/17/16 0337  WBC 24.3*  --  23.3*  --  22.1*  --  20.4* 17.3* 15.4* 14.3*  NEUTROABS 19.7*  --  17.9*  --  17.3*  --  15.7*  --   --   --   HGB 9.1*  < > 8.6*  < > 8.6*  < > 8.7* 9.0* 9.2* 9.2*  HCT 27.8*  < > 26.3*  < > 27.4*  < > 27.4* 28.4* 28.9* 29.0*  MCV 90.6  --  91.0  --  94.2  --  95.5 93.7 94.4 94.5  PLT 294  --  277  --  262  --  229 213 222 218  < > = values in this interval not displayed.  Medications:    . amiodarone  400 mg Oral Daily  . carvedilol  37.5 mg Oral BID WC  .  ceFAZolin (ANCEF) IV  2 g Intravenous  To SSTC  . chlorhexidine  60 mL Topical Once  . chlorhexidine  60 mL Topical Once  . docusate sodium  100 mg Oral BID  . feeding supplement (ENSURE ENLIVE)  237 mL Oral BID BM  . gentamicin irrigation  80 mg Irrigation To SSTC  . insulin aspart  0-5 Units Subcutaneous QHS  . insulin aspart  0-9 Units Subcutaneous TID WC  . isosorbide-hydrALAZINE  0.5 tablet Oral TID  . levothyroxine  75 mcg Oral QAC breakfast  . polyethylene glycol  17 g Oral Daily  . sodium chloride flush  3 mL Intravenous Q12H  . sodium chloride flush  3 mL Intravenous Q12H   Elmarie Shiley, MD 02/17/2016, 9:07 AM

## 2016-02-17 NOTE — Progress Notes (Signed)
OT Cancellation Note  Patient Details Name: Demontrez Ardis MRN: [id number] DOB: Feb 22, 1951   Cancelled Treatment:    Reason Eval/Treat Not Completed: Patient at procedure or test/ unavailable  Darlina Rumpf [place], OTR/L [id number]  02/17/2016, 2:52 PM

## 2016-02-17 NOTE — Progress Notes (Signed)
   02/17/16 1415  First Vascular Site Assessment  #1 - Vascular Site Assessment Scale Level 0  #1 - Air in TR Band 0 cc  site with small pink/red brusing, no bleeding obs, Cath Lab updated to d/c TR band while in ICD procedure

## 2016-02-17 NOTE — Discharge Instructions (Signed)
° ° °  Supplemental Discharge Instructions for  Pacemaker/Defibrillator Patients  Activity No heavy lifting or vigorous activity with your left/right arm for 6 to 8 weeks.  Do not raise your left/right arm above your head for one week.  Gradually raise your affected arm as drawn below.              02/21/16                     02/22/16                   02/23/16                  02/24/16 __  NO DRIVING for  6 months     WOUND CARE - Keep the wound area clean and dry.  Do not get this area wet for one week. No showers for one week; you may shower on  02/24/16   . - The tape/steri-strips on your wound will fall off; do not pull them off.  No bandage is needed on the site.  DO  NOT apply any creams, oils, or ointments to the wound area. - If you notice any drainage or discharge from the wound, any swelling or bruising at the site, or you develop a fever > 101? F after you are discharged home, call the office at once.  Special Instructions - You are still able to use cellular telephones; use the ear opposite the side where you have your pacemaker/defibrillator.  Avoid carrying your cellular phone near your device. - When traveling through airports, show security personnel your identification card to avoid being screened in the metal detectors.  Ask the security personnel to use the hand wand. - Avoid arc welding equipment, MRI testing (magnetic resonance imaging), TENS units (transcutaneous nerve stimulators).  Call the office for questions about other devices. - Avoid electrical appliances that are in poor condition or are not properly grounded. - Microwave ovens are safe to be near or to operate.  Additional information for defibrillator patients should your device go off: - If your device goes off ONCE and you feel fine afterward, notify the device clinic nurses. - If your device goes off ONCE and you do not feel well afterward, call 911. - If your device goes off TWICE, call 911. - If your device  goes off THREE times in one day, call 911.  DO NOT DRIVE YOURSELF OR A FAMILY MEMBER WITH A DEFIBRILLATOR TO THE HOSPITAL--CALL 911.

## 2016-02-17 NOTE — Progress Notes (Signed)
PROGRESS NOTE    Eric [name]  [id number] DOB: 1951-03-17 DOA: 01/31/2016 PCP: [name], MD   Brief Narrative:  Eric [name] is a 65 y.o. BM PMHx Chronic Systolic CHF, A-.fib on Eliquis, HTN,Heart murmur,Dysrhythmia, CKD, Hypothyroidism, Chronic Pain Syndrome (low back), .   He was in his USOH until evening of 05/08 when he was with his wife at [place]. As they were leaving [employer], he began driving the car and while still in the parking lot, he began to breath very heavily before becoming unresponsive. His wife immediately got the car stopped, got him out of the car and began CPR while calling EMS. She feels that EMS arrived within 5 - 10 minutes and upon their arrival, he received 2 defibrillations via AED. ROSC was achieved within roughly 15 - 20 minutes.  Upon ED arrival, he remained unresponsive and was subsequently intubated. PCCM was called for admission and consideration of hypothermia protocol. Cardiology was also called in consultation.  Per his wife, he had not had any complaints recently and had behaved completely normal. 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   Assessment & Plan:   Principal Problem:   Cardiac arrest [facility]) Active Problems:   Chronic systolic heart failure (HCC)   Hyperlipidemia   Left bundle branch block   Obesity (BMI 30-39.9)   Atrial flutter ([place])   Acute respiratory failure ([place])   Encounter for central line placement   Arrhythmia   Altered mental status   HCAP (healthcare-associated pneumonia)   AKI (acute kidney injury) ([place])   Ventricular fibrillation ([place])   Aortic aneurysm without rupture [facility])   Aortic insufficiency   Back pain   Leg weakness, bilateral   Paroxysmal atrial fibrillation (HCC)   Pneumonia   Acute lumbar back pain   Non-traumatic rhabdomyolysis   Retroperitoneal bleed   Weakness of both lower extremities   Lower extremity weakness   Femoral  neuropathy  V. fib Cardiac arrest  - CPR performed by wife, multiple shocks -Hypothermic protocol performed -  Chronic systolic CHF  -(EF [id number] per echo from April 2017) -Strict I and O's since admission + 58ml -Daily weight  Filed Weights   02/15/16 0316 02/16/16 0341 02/17/16 0408  Weight: 113.5 kg (250 lb 3.6 oz) 116.3 kg (256 lb 6.3 oz) 113.1 kg (249 lb 5.4 oz)  -Coreg 37.5 mg BID -BiDil 20-30 7.5 mg 0.5 tablet TID -Transfuse for hemoglobin<7 -5/19 transfuse 2 units PRBC -5/25 S/P left cardiac catheterization; multivessel disease see results below  Aortic aneurysm -4.9 cm aortic aneurysm -- will need f/u CT of chest once extubated and Cr improved   HTN  -Stable -See CHF  Paroxysmal A.fib  -See CHF -Hold all anticoagulants secondary to acute blood loss anemia  Aspiration Pneumonia positive CITROBACTER KOSERI -Completed 7 day course antibiotics  -Patient with continued leukocytosis, however afebrile. Improving  Acute encephalopathy  -Resolved  New-onset bilateral LE weakness and numbness -MRI L-spine; bilateral retroperitoneal bleed see results below   -Patient continues to have SSx although on physical exam pain in L-spine region is almost fully resolved. SSx  improving (pressure on spine/lumbar nerve plexus). -[facility]; Believes all neurologic deficit secondary to large bilateral retroperitoneal bleed. Negative CVA, cord compression by CT  Acute on chronic renal failure (baselineCr~1.9)  Lab Results  Component Value Date   CREATININE 1.55* 02/17/2016   CREATININE 1.60* 02/16/2016   CREATININE 1.05 02/15/2016   Hyperkalemia -resolved  Rhabdomyolysis -  Atorvastatin stopped 5/17 -Patient's CK climbing and after 10 days cannot blame V. fib arrest. 5/19 CK continues to climb. -Repeat CK q 6hr -CK appears to have peaked at 7202 and is now trending down -Sodium Bicarb 75 ml/hr; Held by nephrology, secondary patient obtaining 2 units RBC     Nutrition -Dysphagia 3 nectar thick  Hypothyroidism. -Synthroid 75 g daily  -TSH pending  Acute blood loss anemia/Bilateral Retroperitoneal Bleed Recent Labs  Recent Labs Lab 02/13/16 1200 02/14/16 0310 02/15/16 0222 02/16/16 0532 02/17/16 0337  HGB 8.8* 8.7* 9.0* 9.2* 9.2*  -5/19 transfuse 2 units PRBC -Hold any anticoagulant -Patient not able to have abdominal/pelvic CT with contrast secondary to renal function -H/H stable continue to monitor closely      DVT prophylaxis: SCD Code Status: Full Family Communication: None available Disposition Plan: Resolution rhabdomyolysis, workup for lower extremity weakness   Consultants:  Stefano Gaul nephrology EricNandigam neurology   Procedures/Significant Events:  05/08 - admitted after cardiac arrest. Started on TTM - 33 degrees C CXR 05/08 >>no acute process. CT head 05/08 >>no acute process Echo 5/9>>EF 30 to 35%, severe aortic regurgitation EEG 5/9>>no focal, hemispheric, or lateralizing features; demonstrated a burst/suppression pattern, which can be consistent with a sedated EEG 5/11>>mod diffuse slowing, No epileptiform activity was recorded. 5/10-pt rewarmed per hypothermia protocol 5/14 >> Extubated Hip spine films 5/15 > no acute abnormality 5/16 TEE;LVEF= 30%- 35%. Diffuse hypokinesis. - Aortic valve: Moderate regurgitation.- Left atrium: Moderately dilated.  5/19 MRI L-spine; Bilateral retroperitoneal hemorrhage involving the psoas musculature. Follow-up CT Abdomen and Pelvis could characterize further. 5/21 CT head/C-spine; negative acute infarct-C-spine DJD 5/21 CT abdomen pelvis WO contrast;-Hemorrhage along the psoas musculature bilaterally, and along the iliacus musculature, with overlying hemorrhage, Lt>>Rt.- Mild hemorrhage tracking proximal lower extremities bilaterally.- Mild soft tissue inflammation overlying the psoas musculature. - Mild ectasia of the distal abdominal aorta and common iliac  arteries bilaterally, without significant aneurysmal dilatation. 5/21 CT T-spine WO contrast; DJD 5/25 left heart cath;-Prox LAD to Mid LAD lesion, 25% stenosed.-Ost 1st Diag to 1st Diag lesion, 25% stenosed.Colon [place] 2nd Diag lesion, 50% stenosed.-[place] Intermedius 25% stenosed.    Cultures Blood 05/08 >>no growth 5/11>> Urine 05/08 >>no growth 5/10 Sputum 05/08 >>few GPC, few GNR>>> 5/12 sputum positive CITROBACTER KOSERI MRSA PCR 5/9>>Negative BCx2 5/12>>> 5/18 blood pending 5/18 urine pending  Antimicrobials: Zosyn 5/11>> 5/16 Vancomycin 5/11>> 5/14 Ceftriaxone 5/16 >> 5/18   Devices    LINES / TUBES:      Continuous Infusions: . sodium chloride 10 mL/hr at 02/11/16 1757  . sodium chloride       Subjective: 5/25 Patient has been in cardiac catheterization lab on both occasions attempted to evaluate patient. Currently downstairs having ICD placed. .   Objective: Filed Vitals:   02/17/16 1240 02/17/16 1253 02/17/16 1300 02/17/16 1400  BP: 140/79 127/72 130/80 141/78  Pulse: 85   73  Temp:      TempSrc:      Resp: 13   19  Height:      Weight:      SpO2: 100%   100%    Intake/Output Summary (Last 24 hours) at 02/17/16 1444 Last data filed at 02/17/16 0900  Gross per 24 hour  Intake 416.67 ml  Output    800 ml  Net -383.33 ml   Filed Weights   02/15/16 0316 02/16/16 0341 02/17/16 0408  Weight: 113.5 kg (250 lb 3.6 oz) 116.3 kg (256 lb 6.3 oz) 113.1 kg (249 lb  5.4 oz)    Examination: Unable to evaluate initially secondary to patient being in catheterization lab for left heart cath. Now patient off floor for ICD placement. No charge      Data Reviewed: Care during the described time interval was provided by me .  I have reviewed this patient's available data, including medical history, events of note, physical examination, and all test results as part of my evaluation. I have personally reviewed and interpreted all radiology studies.  CBC:  Recent  Labs Lab Feb 23, 2016 0406  02-23-2016 1930  02/12/16 0428  02/13/16 0322  02/13/16 1200 02/14/16 0310 02/15/16 0222 02/16/16 0532 02/17/16 0337  WBC 26.6*  < > 24.3*  --  23.3*  --  22.1*  --   --  20.4* 17.3* 15.4* 14.3*  NEUTROABS 20.7*  --  19.7*  --  17.9*  --  17.3*  --   --  15.7*  --   --   --   HGB 7.1*  < > 9.1*  < > 8.6*  < > 8.6*  < > 8.8* 8.7* 9.0* 9.2* 9.2*  HCT 21.9*  < > 27.8*  < > 26.3*  < > 27.4*  < > 28.0* 27.4* 28.4* 28.9* 29.0*  MCV 95.2  < > 90.6  --  91.0  --  94.2  --   --  95.5 93.7 94.4 94.5  PLT 279  < > 294  --  277  --  262  --   --  229 213 222 218  < > = values in this interval not displayed. Basic Metabolic Panel:  Recent Labs Lab 02/13/16 0322 02/14/16 0310 02/15/16 0222 02/16/16 0532 02/17/16 0337  NA 143 143 137 135 133*  K 4.0 4.1 4.1 4.2 4.3  CL 107 112* 108 108 107  CO2 27 26 22  20* 21*  GLUCOSE 179* 141* 155* 130* 118*  BUN 72* 51* 42* 38* 32*  CREATININE 2.22* 1.83* 1.05 1.60* 1.55*  CALCIUM 8.3* 8.3* 8.3* 8.4* 8.4*  MG 3.2* 2.7* 2.3 2.2 2.1  PHOS  --  2.3*  --   --  2.7   GFR: Estimated Creatinine Clearance: 59.7 mL/min (by C-G formula based on Cr of 1.55). Liver Function Tests:  Recent Labs Lab 02/17/16 0337  AST 64*  ALT 41  ALKPHOS 60  BILITOT 1.6*  PROT 5.9*  ALBUMIN 2.2*  2.3*   No results for input(s): LIPASE, AMYLASE in the last 168 hours. No results for input(s): AMMONIA in the last 168 hours. Coagulation Profile:  Recent Labs Lab 2016/02/23 1326  INR 1.42   Cardiac Enzymes:  Recent Labs Lab 02/12/16 2228 02/13/16 0322 02/14/16 0310 02/15/16 0222 02/16/16 0532  CKTOTAL 3419* 3442* 2121* 1474* 776*   BNP (last 3 results) No results for input(s): PROBNP in the last 8760 hours. HbA1C: No results for input(s): HGBA1C in the last 72 hours. CBG:  Recent Labs Lab 02/16/16 1241 02/16/16 1634 02/16/16 2136 02/17/16 0743 02/17/16 1354  GLUCAP 149* 139* 145* 125* 129*   Lipid Profile: No results for  input(s): CHOL, HDL, LDLCALC, TRIG, CHOLHDL, LDLDIRECT in the last 72 hours. Thyroid Function Tests: No results for input(s): TSH, T4TOTAL, FREET4, T3FREE, THYROIDAB in the last 72 hours. Anemia Panel: No results for input(s): VITAMINB12, FOLATE, FERRITIN, TIBC, IRON, RETICCTPCT in the last 72 hours. Urine analysis:    Component Value Date/Time   COLORURINE YELLOW 02/10/2016 1802   APPEARANCEUR CLEAR 02/10/2016 1802   LABSPEC 1.021 02/10/2016 1802  PHURINE 5.0 02/10/2016 1802   GLUCOSEU NEGATIVE 02/10/2016 1802   HGBUR NEGATIVE 02/10/2016 1802   BILIRUBINUR NEGATIVE 02/10/2016 1802   KETONESUR NEGATIVE 02/10/2016 1802   PROTEINUR NEGATIVE 02/10/2016 1802   NITRITE NEGATIVE 02/10/2016 1802   LEUKOCYTESUR NEGATIVE 02/10/2016 1802   Sepsis Labs: @LABRCNTIP (procalcitonin:4,lacticidven:4)  ) Recent Results (from the past 240 hour(s))  Culture, blood (Routine X 2) w Reflex to ID Panel     Status: None   Collection Time: 02/10/16  8:40 AM  Result Value Ref Range Status   Specimen Description BLOOD RIGHT HAND  Final   Special Requests BOTTLES DRAWN AEROBIC ONLY 4CC  Final   Culture NO GROWTH 5 DAYS  Final   Report Status 02/15/2016 FINAL  Final  Culture, blood (Routine X 2) w Reflex to ID Panel     Status: None   Collection Time: 02/10/16  9:00 AM  Result Value Ref Range Status   Specimen Description BLOOD LEFT HAND  Final   Special Requests IN PEDIATRIC BOTTLE 1.5CC  Final   Culture NO GROWTH 5 DAYS  Final   Report Status 02/15/2016 FINAL  Final  Culture, Urine     Status: Abnormal   Collection Time: 02/10/16  6:02 PM  Result Value Ref Range Status   Specimen Description URINE, CLEAN CATCH  Final   Special Requests NONE  Final   Culture 1,000 COLONIES/mL INSIGNIFICANT GROWTH (A)  Final   Report Status 02/12/2016 FINAL  Final  Surgical pcr screen     Status: Abnormal   Collection Time: 02/17/16  5:30 AM  Result Value Ref Range Status   MRSA, PCR NEGATIVE NEGATIVE Final    Staphylococcus aureus POSITIVE (A) NEGATIVE Final    Comment:        The Xpert SA Assay (FDA approved for NASAL specimens in patients over 66 years of age), is one component of a comprehensive surveillance program.  Test performance has been validated by [facility] for patients greater than or equal to 27 year old. It is not intended to diagnose infection nor to guide or monitor treatment.          Radiology Studies: No results found.      Scheduled Meds: . amiodarone  400 mg Oral Daily  . carvedilol  37.5 mg Oral BID WC  . docusate sodium  100 mg Oral BID  . insulin aspart  0-5 Units Subcutaneous QHS  . insulin aspart  0-9 Units Subcutaneous TID WC  . isosorbide-hydrALAZINE  0.5 tablet Oral TID  . levothyroxine  75 mcg Oral QAC breakfast  . polyethylene glycol  17 g Oral Daily  . sodium chloride flush  3 mL Intravenous Q12H   Continuous Infusions: . sodium chloride 10 mL/hr at 02/11/16 1757  . sodium chloride       LOS: 16 days    Time spent: 40 minutes    Berenise Hunton, Geraldo Docker, MD [facility] Pager [phone]   If 7PM-7AM, please contact night-coverage [email or web address] Password [facility] 02/17/2016, 2:44 PM

## 2016-02-17 NOTE — H&P (Signed)
  ICD Criteria  Current LVEF:25%. Within 12 months prior to implant: Yes   Heart failure history: Yes, Class II  Cardiomyopathy history: Yes, Non-Ischemic Cardiomyopathy.  Atrial Fibrillation/Atrial Flutter: Yes, Paroxysmal.  Ventricular tachycardia history: No.  Cardiac arrest history: Yes, Ventricular Fibrillation.  History of syndromes with risk of sudden death: No.  Previous ICD: No.  Current ICD indication: Secondary  PPM indication: No.   Class I or II Bradycardia indication present: No  Beta Blocker therapy for 3 or more months: Yes, prescribed.   Ace Inhibitor/ARB therapy for 3 or more months:no, medical reason

## 2016-02-17 NOTE — Interval H&P Note (Signed)
Cath Lab Visit (complete for each Cath Lab visit)  Clinical Evaluation Leading to the Procedure:   ACS: Yes.    Non-ACS:    Anginal Classification: CCS IV  Anti-ischemic medical therapy: Minimal Therapy (1 class of medications)  Non-Invasive Test Results: No non-invasive testing performed  Prior CABG: No previous CABG      History and Physical Interval Note:  02/17/2016 11:49 AM  Eric [name]  has presented today for surgery, with the diagnosis of v-fib arrest  The various methods of treatment have been discussed with the patient and family. After consideration of risks, benefits and other options for treatment, the patient has consented to  Procedure(s): Left Heart Cath and Coronary Angiography (N/A) as a surgical intervention .  The patient's history has been reviewed, patient examined, no change in status, stable for surgery.  I have reviewed the patient's chart and labs.  Questions were answered to the patient's satisfaction.     Larae Grooms

## 2016-02-17 NOTE — Progress Notes (Signed)
Patient Name: Jlen Debock Date of Encounter: 02/17/2016  Principal Problem:   Cardiac arrest [facility]) Active Problems:   Chronic systolic heart failure ([place])   Hyperlipidemia   Left bundle branch block   Obesity (BMI 30-39.9)   Atrial flutter ([place])   Acute respiratory failure ([place])   Encounter for central line placement   Arrhythmia   Altered mental status   HCAP (healthcare-associated pneumonia)   AKI (acute kidney injury) ([place])   Ventricular fibrillation (HCC)   Aortic aneurysm without rupture (HCC)   Aortic insufficiency   Back pain   Leg weakness, bilateral   Paroxysmal atrial fibrillation (HCC)   Pneumonia   Acute lumbar back pain   Non-traumatic rhabdomyolysis   Retroperitoneal bleed   Weakness of both lower extremities   Lower extremity weakness   Femoral neuropathy   Length of Stay: 16  SUBJECTIVE No complaints, still have lower back pain. Worked w/ PT yesterday.    Tele: sinus rhythm, HR 84   CURRENT MEDS . amiodarone  400 mg Oral Daily  . carvedilol  37.5 mg Oral BID WC  .  ceFAZolin (ANCEF) IV  2 g Intravenous To SSTC  . chlorhexidine  60 mL Topical Once  . chlorhexidine  60 mL Topical Once  . docusate sodium  100 mg Oral BID  . feeding supplement (ENSURE ENLIVE)  237 mL Oral BID BM  . gentamicin irrigation  80 mg Irrigation To SSTC  . insulin aspart  0-5 Units Subcutaneous QHS  . insulin aspart  0-9 Units Subcutaneous TID WC  . isosorbide-hydrALAZINE  0.5 tablet Oral TID  . levothyroxine  75 mcg Oral QAC breakfast  . polyethylene glycol  17 g Oral Daily  . sodium chloride flush  3 mL Intravenous Q12H  . sodium chloride flush  3 mL Intravenous Q12H    OBJECTIVE   Intake/Output Summary (Last 24 hours) at 02/17/16 0745 Last data filed at 02/17/16 0600  Gross per 24 hour  Intake    840 ml  Output    650 ml  Net    190 ml   Filed Weights   02/15/16 0316 02/16/16 0341 02/17/16 0408  Weight: 250 lb 3.6 oz (113.5 kg) 256 lb 6.3 oz (116.3 kg)  249 lb 5.4 oz (113.1 kg)    PHYSICAL EXAM Filed Vitals:   02/17/16 0408 02/17/16 0500 02/17/16 0515 02/17/16 0600  BP: 160/90  160/90   Pulse: 83 86  103  Temp: 99.1 F (37.3 C)     TempSrc: Oral     Resp: 18 16  19   Height:      Weight: 249 lb 5.4 oz (113.1 kg)     SpO2: 99% 99%  99%   General: Alert, oriented x3, no distress Head: EOMI, moist mucous membranes Pulm: clear to auscultation, no crackles or rhonchi Cardiovascular: RRR, no murmurs or gallops Abdomen: no tenderness or distention, no masses by palpation, no abnormal pulsatility or arterial bruits, normal bowel sounds, no hepatosplenomegaly Extremities: no clubbing, cyanosis or 1+ edema b/l Neurological: CN 2-12 grossly intact Skin: warm and dry  LABS  CBC  Recent Labs  02/16/16 0532 02/17/16 0337  WBC 15.4* 14.3*  HGB 9.2* 9.2*  HCT 28.9* 29.0*  MCV 94.4 94.5  PLT 222 99991111   Basic Metabolic Panel  Recent Labs  02/16/16 0532 02/17/16 0337  NA 135 133*  K 4.2 4.3  CL 108 107  CO2 20* 21*  GLUCOSE 130* 118*  BUN 38* 32*  CREATININE  1.60* 1.55*  CALCIUM 8.4* 8.4*  MG 2.2 2.1  PHOS  --  2.7   Liver Function Tests  Recent Labs  02/17/16 0337  AST 64*  ALT 41  ALKPHOS 60  BILITOT 1.6*  PROT 5.9*  ALBUMIN 2.2*  2.3*   Cardiac Enzymes  Recent Labs  02/15/16 0222 02/16/16 0532  CKTOTAL 1474* 776*    Radiology Studies CT ABDOMEN AND PELVIS WITHOUT CONTRAST  TECHNIQUE: Multidetector CT imaging of the abdomen and pelvis was performed following the standard protocol without IV contrast.  COMPARISON: MRI of the lumbar spine performed 02/11/2016  FINDINGS: Minimal bibasilar atelectasis is noted.  The liver and spleen are unremarkable in appearance. The gallbladder is within normal limits. The pancreas and adrenal glands are unremarkable.  Nonspecific perinephric stranding is noted bilaterally. A 1.5 cm cyst is noted at the lower pole of the right kidney. The kidneys  are otherwise unremarkable. There is no evidence of hydronephrosis. No renal or ureteral stones are identified.  No free fluid is identified. The small bowel is unremarkable in appearance. The stomach is within normal limits. No acute vascular abnormalities are seen. There is mild ectasia of the distal abdominal aorta and common iliac arteries bilaterally, without evidence of significant aneurysmal dilatation.  The appendix is normal in caliber and contains contrast, without evidence of appendicitis. The colon is grossly unremarkable in appearance.  The bladder is mildly distended and grossly unremarkable. The prostate is borderline normal in size. No inguinal lymphadenopathy is seen.  Hemorrhage is noted along the psoas musculature bilaterally, and along the iliacus musculature, with overlying hemorrhage, more prominent on the left. Mild hemorrhage is also seen tracking into the proximal lower extremities bilaterally. Mild soft tissue inflammation is noted overlying the psoas musculature.  No acute osseous abnormalities are identified. Facet disease is noted at the lower lumbar spine.  IMPRESSION: 1. Hemorrhage along the psoas musculature bilaterally, and along the iliacus musculature, with overlying hemorrhage, more prominent on the left. Mild hemorrhage tracking into the proximal lower extremities bilaterally. Mild soft tissue inflammation overlying the psoas musculature. 2. Small left renal cyst noted. 3. Mild ectasia of the distal abdominal aorta and common iliac arteries bilaterally, without significant aneurysmal dilatation.   Electronically Signed  By: Garald Balding M.D.  On: 02/14/2016 04:07    ASSESSMENT AND PLAN  1. S/P cardiac arrest, presumed VT/VF VF on AED interrogation. Labs stable. Cardiac cath at 10:30am this morning. Plan for CRT-D as he has HF ad LBBB for secondary prevention pending results of cath today.   2. Chronic systolic heart  failure-TTE 5/17 reveals EF of 99991111, grade 2 diastolic dysfunction. Holding lasix due to AKI. Volume status looks normal. Continue carvedilol and Bidil. Can start ACEi/ARB pending renal function s/p cath procedure  3. 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 TEE 02/08/16 revealed moderate aortic regurgitation with eccentric jet. Aortic root 5.2cm. Consider CT in 6 months.  4. Atrial fibrillation-- now in sinus rhythm. On home coreg 25mg  BID. Off anticoagulants due to retroperitoneal bleeding.  Continue amiodarone po 400mg  daily.   5. Hypertension- controlled, continue coreg, resumed bidil 20-37.5mg  yesterday. BP runs low at times- will not increase dose yet.  6. Aortic root enlargement - aortic root measures 79mm on TEE -May benefit CT Angio when renal function is clearly stabilized.  7. 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 Left bundle branch block typical LBBB with QRS duration  116ms predicts response to CRT.  9. Hyperlipidemia - will resume statin when rhabdo resolves.   10. Possible aspiration pneumonia- completed course of abx. CXR 5/18 neg for infection.   11. Leukocytosis -- likely due to acute retroperitoneal bleed, resolving. Afebrile overnight  12. Anemia-- hgb 7.1 previously from 9.5 secondary to retroperitoneal bleed, discovered by MRI back.  - Status post 2 units of blood with diuretics  - Continue to hold heparin IV (was placed in the setting of cardioversion) - consider iron supplements.    Julious Oka, MD   02/17/2016 7:45 AM  Patient seen and examined and history reviewed. Agree with above findings and plan. Doing well. Some hip pain last night. No dyspnea or chest pain. No arrhythmia. Renal function improved. Creatinine 1.55. Hgb is stable 9.2. Plan to proceed with cardiac cath today- coronaries only. If he has significant CAD will need to postpone  revascularization with stenting until he has fully recovered from his acute illness in several weeks. If no significant CAD plan to proceed with ICD/CRT today with Dr. Lovena Le. Post procedure patient is stable to transfer to telemetry.   Peter Martinique, [place] 02/17/2016 8:04 AM

## 2016-02-17 NOTE — Progress Notes (Signed)
SUBJECTIVE: The patient is doing well today.  At this time, he denies chest pain, shortness of breath, or any new concerns. LLE weakness continues to improve, RLE improving but slower  . amiodarone  400 mg Oral Daily  . carvedilol  37.5 mg Oral BID WC  .  ceFAZolin (ANCEF) IV  2 g Intravenous To SSTC  . chlorhexidine  60 mL Topical Once  . chlorhexidine  60 mL Topical Once  . docusate sodium  100 mg Oral BID  . feeding supplement (ENSURE ENLIVE)  237 mL Oral BID BM  . gentamicin irrigation  80 mg Irrigation To SSTC  . insulin aspart  0-5 Units Subcutaneous QHS  . insulin aspart  0-9 Units Subcutaneous TID WC  . isosorbide-hydrALAZINE  0.5 tablet Oral TID  . levothyroxine  75 mcg Oral QAC breakfast  . polyethylene glycol  17 g Oral Daily  . sodium chloride flush  3 mL Intravenous Q12H  . sodium chloride flush  3 mL Intravenous Q12H   . sodium chloride 10 mL/hr at 02/11/16 1757  . sodium chloride    . sodium chloride 50 mL/hr at 02/17/16 0604  . sodium chloride 250 mL (02/17/16 0600)    OBJECTIVE: Physical Exam: Filed Vitals:   02/17/16 0500 02/17/16 0515 02/17/16 0600 02/17/16 0745  BP:  160/90  141/71  Pulse: 86  103 86  Temp:    97.2 F (36.2 C)  TempSrc:    Oral  Resp: 16  19 17   Height:      Weight:      SpO2: 99%  99% 99%    Intake/Output Summary (Last 24 hours) at 02/17/16 0846 Last data filed at 02/17/16 0600  Gross per 24 hour  Intake    840 ml  Output    650 ml  Net    190 ml    Telemetry reveals  SR 80's-100  GEN- The patient is in NAD, alert and oriented x 3 today.   Head- normocephalic, atraumatic Eyes-  Sclera clear, conjunctiva pink Ears- hearing intact Oropharynx- clear Neck- supple Lungs- Clear to ausculation bilaterally, normal work of breathing Heart- Regular rate and rhythm, tachycardic, 2/6SM, no rubs or gallops GI- soft, NT, ND Extremities- no clubbing, cyanosis, or edema Skin- no rash or lesion Psych- euthymic mood, full  affect Neuro- no gross deficits appreciated  LABS: Basic Metabolic Panel:  Recent Labs  02/16/16 0532 02/17/16 0337  NA 135 133*  K 4.2 4.3  CL 108 107  CO2 20* 21*  GLUCOSE 130* 118*  BUN 38* 32*  CREATININE 1.60* 1.55*  CALCIUM 8.4* 8.4*  MG 2.2 2.1  PHOS  --  2.7   CBC:  Recent Labs  02/16/16 0532 02/17/16 0337  WBC 15.4* 14.3*  HGB 9.2* 9.2*  HCT 28.9* 29.0*  MCV 94.4 94.5  PLT 222 218   Cardiac Enzymes:  Recent Labs  02/15/16 0222 02/16/16 0532  CKTOTAL 1474* 776*     RADIOLOGY: 02/13/16: CT abdomen/pelvis IMPRESSION: 1. Hemorrhage along the psoas musculature bilaterally, and along the iliacus musculature, with overlying hemorrhage, more prominent on the left. Mild hemorrhage tracking into the proximal lower extremities bilaterally. Mild soft tissue inflammation overlying the psoas musculature. 2. Small left renal cyst noted. 3. Mild ectasia of the distal abdominal aorta and common iliac arteries bilaterally, without significant aneurysmal dilatation.   ASSESSMENT AND PLAN:  Assessment and Plan:  1. Cardiac arrest witnessed out of hospital on 01/31/16 with CPR EMS record reviewed. FD  first to respond applied AED and advised shock, patient received 2 shocks followed by further CPR with ROSC      AED rhythm reviewed, was VF  Intubated in ED   s/p Hypothermia protocol, rewarming completed 02/03/16   Chronic LBBB  EF now 30-35% (40-45% only 3 weeks ago)  schemic w/u (cath) pending 02/01/16 IV amiodarone stopped secondary to bradycardia, 2nd degree AVBlock/wenckebach with variable conduction, 2:1 in the 30's noted, and prolonged QT in environment of K+ 3.0   WCT reported 5/13 PM felt to have been AF by notes and placed back on amio gtt.   BB re-started 02/06/16  Extubated 02/06/16 Amio gtt resumed, felt bradycardia/heart block 5/9 in the environment of cooling and electrolyte imbalance.  QT felt stable by Dr. Caryl Comes at  that time (02/07/16) and has since been transition to PO amiodarone, on 400mg  daily.     NSVT mentioned 02/10/16 by cardiology note  He is scheduled for LHC today to evaluate his coronary anatomy Will plan for ICD afterwards if no significant CAD, plan for CRT-D given LBBB  3. Pneumonia  Extubated, 02/06/16  speech therapy has seen patient, aphonia s/p prolonged intibation, much better  off/completed abx  CCM following  3. Acute on chronic renal insuff.  nephrology called to the case   Creat peak 3.53 today is 1.55  4. Aortic root dilation     Cardiology plans CT Angio in 38mo to follow  Aortic insuff is worse, >> Severe on echo here  is tri leaflet  TEE 02/08/16 revealed moderate aortic regurgitation with eccentric jet. Aortic root 5.2cm.    5. AFib (described as persistent in notes)  SR currently/PAF here  CHA2DS2Vasc is at least 2 on Eliquis out patient     Heparin gtt here stopped secondary to hemorrhage  6. HTN     C/w primary team  7. CM     EF by his TEE 02/09/16 was 30-35%     LBBB     He is euvolemic by his I/O's and exam  8. Spontaneous hemorrhage psoas musculature b/l on 02/13/16     Acute blood loss anemia     Hgb down to 6.7 and is s/p PRBC x2 units, today is 9.2     CK elevation max was 6520, last was 776 (statin has been held)     Remains with LE weakness, neurology on case recommend consideration for surgical decompression of femoral nerve and/or PT     Medicine's note describes improving function and in d/w the patient holding off on surgical intervention  9. Leukocytosis     Peak 26.6, down to 14.3     Aspiration pneumonia     hemorrhage       Tommye Standard, PA-C 02/17/2016 8:46 AM    EP Attending  Patient seen and examined. Agree with above. For BiV ICD today if cath shows no cause of VF arrest.  Mikle Bosworth.D.

## 2016-02-18 ENCOUNTER — Encounter (HOSPITAL_COMMUNITY): Payer: Self-pay | Admitting: Interventional Cardiology

## 2016-02-18 ENCOUNTER — Inpatient Hospital Stay (HOSPITAL_COMMUNITY): Payer: BC Managed Care – PPO

## 2016-02-18 DIAGNOSIS — J9601 Acute respiratory failure with hypoxia: Secondary | ICD-10-CM | POA: Diagnosis not present

## 2016-02-18 DIAGNOSIS — I472 Ventricular tachycardia: Secondary | ICD-10-CM | POA: Diagnosis not present

## 2016-02-18 DIAGNOSIS — I4901 Ventricular fibrillation: Secondary | ICD-10-CM | POA: Diagnosis not present

## 2016-02-18 DIAGNOSIS — I351 Nonrheumatic aortic (valve) insufficiency: Secondary | ICD-10-CM | POA: Diagnosis not present

## 2016-02-18 DIAGNOSIS — N179 Acute kidney failure, unspecified: Secondary | ICD-10-CM | POA: Diagnosis not present

## 2016-02-18 DIAGNOSIS — I5022 Chronic systolic (congestive) heart failure: Secondary | ICD-10-CM | POA: Diagnosis not present

## 2016-02-18 DIAGNOSIS — I469 Cardiac arrest, cause unspecified: Secondary | ICD-10-CM | POA: Diagnosis not present

## 2016-02-18 DIAGNOSIS — G9340 Encephalopathy, unspecified: Secondary | ICD-10-CM | POA: Diagnosis not present

## 2016-02-18 DIAGNOSIS — I712 Thoracic aortic aneurysm, without rupture: Secondary | ICD-10-CM | POA: Diagnosis not present

## 2016-02-18 LAB — GLUCOSE, CAPILLARY
GLUCOSE-CAPILLARY: 126 mg/dL — AB (ref 65–99)
GLUCOSE-CAPILLARY: 99 mg/dL (ref 65–99)
Glucose-Capillary: 124 mg/dL — ABNORMAL HIGH (ref 65–99)
Glucose-Capillary: 198 mg/dL — ABNORMAL HIGH (ref 65–99)

## 2016-02-18 LAB — RENAL FUNCTION PANEL
ALBUMIN: 2.3 g/dL — AB (ref 3.5–5.0)
ANION GAP: 4 — AB (ref 5–15)
BUN: 28 mg/dL — AB (ref 6–20)
CHLORIDE: 108 mmol/L (ref 101–111)
CO2: 21 mmol/L — ABNORMAL LOW (ref 22–32)
Calcium: 8.2 mg/dL — ABNORMAL LOW (ref 8.9–10.3)
Creatinine, Ser: 1.53 mg/dL — ABNORMAL HIGH (ref 0.61–1.24)
GFR, EST AFRICAN AMERICAN: 54 mL/min — AB (ref >60)
GFR, EST NON AFRICAN AMERICAN: 46 mL/min — AB (ref >60)
Glucose, Bld: 114 mg/dL — ABNORMAL HIGH (ref 65–99)
PHOSPHORUS: 3.2 mg/dL (ref 2.5–4.6)
POTASSIUM: 4.4 mmol/L (ref 3.5–5.1)
Sodium: 133 mmol/L — ABNORMAL LOW (ref 135–145)

## 2016-02-18 LAB — MAGNESIUM: Magnesium: 2 mg/dL (ref 1.7–2.4)

## 2016-02-18 MED ORDER — CARVEDILOL 25 MG PO TABS
25.0000 mg | ORAL_TABLET | Freq: Two times a day (BID) | ORAL | Status: DC
  Administered 2016-02-18 – 2016-02-22 (×9): 25 mg via ORAL
  Filled 2016-02-18 (×9): qty 1

## 2016-02-18 MED ORDER — FUROSEMIDE 40 MG PO TABS
40.0000 mg | ORAL_TABLET | Freq: Every day | ORAL | Status: DC
Start: 2016-02-18 — End: 2016-02-18

## 2016-02-18 NOTE — Progress Notes (Signed)
Patient assisted back to bed using Sara-plus lift equipment, Left arm kept across chest to avoid use of arm/sholder, he is 2 + assist with equipment. Patient back in bed call bell within reach . Will monitor patient. Beverlee Wilmarth, Bettina Gavia RN

## 2016-02-18 NOTE — Progress Notes (Signed)
Noted updated therapy assessment today with orthostasis while seated at EOB and unable to attempt to stand or transfer. Pt not yet at a level to tolerate intense inpt rehab or attempt [employer] approval to admit. Dionne Milo will follow up on Monday. She can be reached at [phone] Monday. I can be reached at [phone]

## 2016-02-18 NOTE — Progress Notes (Signed)
Physical Therapy Treatment Patient Details Name: Eric [name] MRN: [id number] DOB: 22-Sep-1951 Today's Date: 02/18/2016    History of Present Illness 65 y.o. M with known sCHF and A.fib, presented 05/08 after out of hospital cardiac arrest. ROSC within 15-20 minutes after defib x 2 (vtach to vfib suspected). ETT 5/8-5/14. Now with acute encephalopathy with concern for anoxia per MD notes. 5/17 noted incr bil leg weakness and back pain; 5/19 MRI Bilateral retroperitoneal hemorrhage involving the psoas muscles; 5/22 neuro consult and determined decompression of nerves not indicated; 5/25 cardiac cath and s/p BiV ICD implant    PT Comments    Patient very motivated, however today was limited by symptomatic orthostasis while seated EOB (unable to even attempt standing or transfer to chair). Pt reporting feeling very hot initially, then "funny feeling but not spinning"; MAP noted to decr 80 to 67 and on return to supine BP 92/54. RN made aware. Patient did have incr movement of RLE with sitting exercises (prior to orthostasis).   Follow Up Recommendations  CIR;Supervision/Assistance - 24 hour     Equipment Recommendations  Other (comment) (TBA)    Recommendations for Other Services       Precautions / Restrictions Precautions Precautions: Fall;ICD/Pacemaker Precaution Comments: Spoke with EP PA re: heavy use of LUE due to LE weakness. She emphasized to avoid movement at shoulder (flexion, abdct, reaching behind/internal rotation/extension) or stretching of upper chest are primary concerns to avoid. Agreed he could weightbear through LUE with elbow near/at his side.  Restrictions Weight Bearing Restrictions: No    Mobility  Bed Mobility Overal bed mobility: Needs Assistance;+2 for physical assistance Bed Mobility: Supine to Sit;Sit to Supine Rolling: Min assist   Supine to sit: Mod assist;+2 for physical assistance;+2 for safety/equipment;HOB elevated Sit to supine: Mod assist;+2  for physical assistance   General bed mobility comments: unable to roll to Lt; elevated HOB and assisted to turn to sit at EOB; pt became orthostatic and ultimately had to return to supine  Transfers                 General transfer comment: Planned to use Clarise Cruz from elevated bed (to minimize Lt shoulder ROM due to new ICD); unable orthostatic  Ambulation/Gait                 Stairs            Wheelchair Mobility    Modified Rankin (Stroke Patients Only)       Balance   Sitting-balance support: No upper extremity supported;Feet unsupported Sitting balance-Leahy Scale: Fair                              Cognition Arousal/Alertness: Awake/alert Behavior During Therapy: WFL for tasks assessed/performed Overall Cognitive Status: Within Functional Limits for tasks assessed                      Exercises General Exercises - Lower Extremity Ankle Circles/Pumps: AROM;Both;10 reps (Lt>Rt ROM) Long Arc Quad: 5 reps;Seated;AAROM;Right (activated quad with eccentric movement "hold your foot up") Hip ABduction/ADduction: AROM;Both;5 reps;Seated (resisted adduction bil "squeeze knees together")    General Comments        Pertinent Vitals/Pain Pain Assessment: 0-10 Pain Score: 4  Pain Location: ICD site Pain Descriptors / Indicators: Operative site guarding Pain Intervention(s): Limited activity within patient's tolerance;Monitored during session    Home Living  Prior Function            PT Goals (current goals can now be found in the care plan section) Acute Rehab PT Goals Patient Stated Goal: to get better Time For Goal Achievement: 02/21/16 Progress towards PT goals: Not progressing toward goals - comment (orthostatic this session)    Frequency  Min 3X/week    PT Plan Current plan remains appropriate    Co-evaluation             End of Session   Activity Tolerance: Treatment limited  secondary to medical complications (Comment) (orthostatic at EOB) Patient left: with call bell/phone within reach;in bed;with nursing/sitter in room;with SCD's reapplied     Time: [id number] PT Time Calculation (min) (ACUTE ONLY): 33 min  Charges:  $Therapeutic Exercise: 8-22 mins $Therapeutic Activity: 8-22 mins                    G Codes:      Emerald Shor March 12, 2016, 12:41 PM Pager [phone]

## 2016-02-18 NOTE — Progress Notes (Signed)
Physical Therapy Treatment Patient Details Name: Eric [name] MRN: [id number] DOB: 03/08/1951 Today's Date: 02/18/2016    History of Present Illness 65 y.o. M with known sCHF and A.fib, presented 05/08 after out of hospital cardiac arrest. ROSC within 15-20 minutes after defib x 2 (vtach to vfib suspected). ETT 5/8-5/14. Now with acute encephalopathy with concern for anoxia per MD notes. 5/17 noted incr bil leg weakness and back pain; 5/19 MRI Bilateral retroperitoneal hemorrhage involving the psoas muscles; 5/22 neuro consult and determined decompression of nerves not indicated; 5/25 cardiac cath and s/p BiV ICD implant    PT Comments    Returned for further LE exercises and education for patient over the weekend. OT later arrived and able to get patient up to EOB and ultimately OOB to chair with Sara-Plus (no orthostasis!). See flowsheet. RLE strength continues to improve--quads currently 2-/5   Follow Up Recommendations  CIR;Supervision/Assistance - 24 hour     Equipment Recommendations  Other (comment) (TBA)    Recommendations for Other Services       Precautions / Restrictions Precautions Precautions: Fall;ICD/Pacemaker Precaution Comments: Spoke with EP PA re: heavy use of LUE due to LE weakness. She emphasized to avoid movement at shoulder (flexion, abdct, reaching behind/internal rotation/extension) or stretching of upper chest are primary concerns to avoid. Agreed he could weightbear through LUE with elbow near/at his side.  Restrictions Weight Bearing Restrictions: No LUE Weight Bearing: Non weight bearing (ICD placement)    Mobility  Bed Mobility Overal bed mobility: Needs Assistance;+2 for physical assistance Bed Mobility: Supine to Sit     Supine to sit: Mod assist;+2 for physical assistance;HOB elevated     General bed mobility comments: unable to roll to Lt due to new pacemaker; elevated HOB and assisted to turn to sit at EOB  Transfers Overall transfer  level: Needs assistance   Transfers: Stand Pivot Transfers   Stand pivot transfers: Total assist;+2 safety/equipment (sara-plus)       General transfer comment: OT present; [name] (not on platform) to avoid shoulder flexion  Ambulation/Gait                 Stairs            Wheelchair Mobility    Modified Rankin (Stroke Patients Only)       Balance   Sitting-balance support: No upper extremity supported;Feet supported Sitting balance-Leahy Scale: Fair                              Cognition Arousal/Alertness: Awake/alert Behavior During Therapy: WFL for tasks assessed/performed Overall Cognitive Status: Within Functional Limits for tasks assessed                      Exercises General Exercises - Lower Extremity Ankle Circles/Pumps: AROM;Both;10 reps (Lt>Rt ROM) Short Arc Quad: AAROM;Right;5 reps;Supine (stronger quad contraction; noted concentric and eccentric) Long Arc Quad:  (activated quad with eccentric movement "hold your foot up") Heel Slides: AAROM;Left;5 reps;Supine Hip ABduction/ADduction:  (resisted adduction bil "squeeze knees together") Other Exercises Other Exercises: Lt hip internal rotation; actively and then PROM stretch    General Comments General comments (skin integrity, edema, etc.): Initiated exercise session and shortly after OT arrived to evaluate patient. Co-treated to attempt OOB (orthostatic in morning). Patient without BP issues and so glad to be OOB      Pertinent Vitals/Pain Pain Assessment: No/denies pain  Home Living                      Prior Function            PT Goals (current goals can now be found in the care plan section) Acute Rehab PT Goals Patient Stated Goal: to get better Time For Goal Achievement: 02/21/16 Progress towards PT goals: Progressing toward goals    Frequency  Min 3X/week    PT Plan Current plan remains appropriate     Co-evaluation             End of Session Equipment Utilized During Treatment: Other (comment) (Sara-plus) Activity Tolerance: Patient tolerated treatment well Patient left: in chair (with OT)     [id number] PT Time Calculation (min) (ACUTE ONLY): 32 min  Charges:  $Therapeutic Exercise: 8-22 mins                    G Codes:      Eric [name] 2016-03-12, 5:12 PM  Pager [phone]

## 2016-02-18 NOTE — Progress Notes (Addendum)
[place] TEAM 1 - Stepdown/ICU TEAM  Eric [name]  [id number] DOB: September 10, 1951 DOA: 01/31/2016 PCP: [name], MD    Brief Narrative:  65 yo M Hx Chronic Systolic CHF, A-fib on Eliquis, HTN, Heart murmur, CKD, Hypothyroidism, and Chronic Pain Syndrome (low back) who was in his USOH until the evening of 05/08 when he began to breath very heavily while driving, before becoming unresponsive. His wife immediately got the car stopped, got him out of the car and began CPR while calling EMS.  EMS arrived within 5 - 10 minutes and upon their arrival, he received 2 defibrillations via AED. ROSC was achieved within roughly 15 - 20 minutes.  Upon ED arrival, he remained unresponsive and was subsequently intubated. PCCM was called for admission and consideration of hypothermia protocol. Cardiology was also called in consultation.  Assessment & Plan:  Cardiac arrest - VT/Vfib -CPR performed by wife and EMS - multiple shocks -Hypothermia protocol performed -Cardiology following - cardiac cath 5/25 w/o signif CAD appreciated -s/p CRT-D 5/25 per EP  LBBB  Chronic systolic CHF  -EF [id number] per TTE April 2017 - EF 30-35% w/ diffuse hypokinesis per TEE 02/08/16 - B LE edema improved today   Aortic aneurysm / aortic root enlargement  -5.2 cm aortic root dilation per TEE 02/08/16 - care per Cardiology   Aortic valve regurgitation - moderate Cards following - to consider CT in 6 months in f/u - noted on TEE 02/08/16  Acute blood loss anemia / Bilateral Retroperitoneal Bleeds Hgb stable - follow w/ Cards restarting SQ heparin for DVT prophy   Bilateral LE weakness and numbness -believed to be due to bilateral retroperitoneal bleeds w/ compression of B femoral nerves - continues to slowly improve - will need CIR stay after cardiac w/u complete  HTN  -some hypotension this morning - lower dose of coreg and follow   Paroxysmal A.fib  -Holding all anticoagulants secondary to acute blood loss  anemia - NSR at present - follow on tele   CITROBACTER KOSERI Aspiration Pneumonia  -Completed 7 day course antibiotics - no clinical sx to suggest ongoing infection at this time   Acute encephalopathy  -Resolved   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 -Synthroid 75 g daily - TSH normal   DM Pt has been on diabetic diet previously, but never required meds - A1c 7.1 - cont SSI - CBG controlled - doubt medical tx will be required    DVT prophylaxis: SQ heparin  Code Status: FULL CODE Family Communication: Spoke with the patient and his wife at bedside Disposition Plan: transfer to 2W cardiac tele - hopeful for CIR in 24-48hrs  Consultants:  [facility]  Nephrology  Neurology PCCM   Significant Events: 05/08 - cardiac arrest. Started on TTM - 33 degrees C CT head 05/08 > no acute process Echo 5/9 > EF 30 to 35%, severe aortic regurgitation EEG 5/9 > no focal, hemispheric, or lateralizing features; demonstrated a burst/suppression pattern, which can be consistent with a sedated EEG 5/11>>mod diffuse slowing, No epileptiform activity was recorded. 5/10 pt rewarmed per hypothermia protocol 5/14 > Extubated 5/15 Hip spine films > no acute abnormality 5/16 TEE - EF= 30%- 35%. Diffuse hypokinesis - Aortic valve: Moderate regurgitation.- Left atrium: Moderately  dilated.  5/19 MRI L-spine - Bilateral retroperitoneal hemorrhage involving the psoas musculature. Follow-up CT Abdomen and Pelvis could characterize further 5/25 - cardiac cath - CRT-D implantation   Antimicrobials:  Zosyn 5/11> 5/16 Vancomycin 5/11> 5/14 Ceftriaxone 5/16 > 5/18  Subjective: Some moderate hypotension w/ sitting up.  No CP, sob, n/v, or abdom pain.   Some decrease in B LE pedal edema.    Objective: Blood pressure 101/74, pulse 97, temperature 98.3 F (36.8 C), temperature source Oral, resp. rate 12, height 5\' 9"  (1.753 m), weight 109 kg (240 lb 4.8 oz), SpO2 100 %.  Intake/Output Summary (Last 24 hours) at 02/18/16 1112 Last data filed at 02/18/16 1045  Gross per 24 hour  Intake    810 ml  Output   1025 ml  Net   -215 ml   Filed Weights   02/16/16 0341 02/17/16 0408 02/18/16 0500  Weight: 116.3 kg (256 lb 6.3 oz) 113.1 kg (249 lb 5.4 oz) 109 kg (240 lb 4.8 oz)    Examination: General: No acute respiratory distress - alert and conversant - quite pleasant  Lungs: Clear to auscultation bilaterally w/o wheezes or crackles Cardiovascular: Regular rate and rhythm without murmur  Abdomen: Nontender, nondistended, soft, bowel sounds positive, no rebound Extremities: No significant cyanosis, or clubbing - 1+ pedal edema B LE   CBC:  Recent Labs Lab 02/11/16 1930  02/12/16 0428  02/13/16 0322  02/14/16 0310 02/15/16 0222 02/16/16 0532 02/17/16 0337 02/17/16 1903  WBC 24.3*  --  23.3*  --  22.1*  --  20.4* 17.3* 15.4* 14.3* 9.8  NEUTROABS 19.7*  --  17.9*  --  17.3*  --  15.7*  --   --   --   --   HGB 9.1*  < > 8.6*  < > 8.6*  < > 8.7* 9.0* 9.2* 9.2* 10.0*  HCT 27.8*  < > 26.3*  < > 27.4*  < > 27.4* 28.4* 28.9* 29.0* 31.6*  MCV 90.6  --  91.0  --  94.2  --  95.5 93.7 94.4 94.5 93.8  PLT 294  --  277  --  262  --  229 213 222 218 233  < > = values in this interval not displayed. Basic Metabolic Panel:  Recent Labs Lab 02/14/16 0310 02/15/16 0222 02/16/16 0532 02/17/16 0337 02/17/16 1903 02/18/16 0234  NA 143 137 135 133*  --  133*  K 4.1 4.1 4.2 4.3  --  4.4  CL 112* 108 108 107  --  108  CO2 26 22 20* 21*  --  21*  GLUCOSE 141* 155* 130* 118*  --  114*  BUN 51* 42* 38* 32*  --  28*  CREATININE 1.83* 1.05 1.60* 1.55* 1.49* 1.53*  CALCIUM 8.3* 8.3* 8.4* 8.4*  --  8.2*  MG 2.7* 2.3 2.2 2.1  --  2.0  PHOS 2.3*  --    --  2.7  --  3.2   GFR: Estimated Creatinine Clearance: 59.3 mL/min (by C-G formula based on Cr of 1.53).  Liver Function Tests:  Recent Labs Lab 02/17/16 0337 02/18/16 0234  AST 64*  --   ALT 41  --   ALKPHOS 60  --   BILITOT 1.6*  --   PROT 5.9*  --   ALBUMIN 2.2*  2.3* 2.3*   Coagulation Profile:  Recent Labs Lab 02/11/16 1326  INR 1.42    Cardiac Enzymes:  Recent Labs Lab 02/12/16 2228 02/13/16 0322 02/14/16 0310  02/15/16 0222 02/16/16 0532  CKTOTAL 3419* 3442* 2121* 1474* 776*    HbA1C: HGB A1C MFR BLD  Date/Time Value Ref Range Status  02/10/2016 03:47 PM 7.1* 4.8 - 5.6 % Final    Comment:    (NOTE)         Pre-diabetes: 5.7 - 6.4         Diabetes: >6.4         Glycemic control for adults with diabetes: <7.0   02/14/2012 06:20 AM 6.1* <5.7 % Final    Comment:    (NOTE)                                                                       According to the ADA Clinical Practice Recommendations for 2011, when HbA1c is used as a screening test:  >=6.5%   Diagnostic of Diabetes Mellitus           (if abnormal result is confirmed) 5.7-6.4%   Increased risk of developing Diabetes Mellitus References:Diagnosis and Classification of Diabetes Mellitus,Diabetes [id number] 1):S62-S69 and Standards of Medical Care in         Diabetes - 2011,Diabetes [id number] (Suppl 1):S11-S61.    CBG:  Recent Labs Lab 02/17/16 0743 02/17/16 1354 02/17/16 1722 02/17/16 2145 02/18/16 0829  GLUCAP 125* 129* 126* 128* 124*    Recent Results (from the past 240 hour(s))  Culture, blood (Routine X 2) w Reflex to ID Panel     Status: None   Collection Time: 02/10/16  8:40 AM  Result Value Ref Range Status   Specimen Description BLOOD RIGHT HAND  Final   Special Requests BOTTLES DRAWN AEROBIC ONLY 4CC  Final   Culture NO GROWTH 5 DAYS  Final   Report Status 02/15/2016 FINAL  Final  Culture, blood (Routine X 2) w Reflex to ID Panel     Status: None    Collection Time: 02/10/16  9:00 AM  Result Value Ref Range Status   Specimen Description BLOOD LEFT HAND  Final   Special Requests IN PEDIATRIC BOTTLE 1.5CC  Final   Culture NO GROWTH 5 DAYS  Final   Report Status 02/15/2016 FINAL  Final  Culture, Urine     Status: Abnormal   Collection Time: 02/10/16  6:02 PM  Result Value Ref Range Status   Specimen Description URINE, CLEAN CATCH  Final   Special Requests NONE  Final   Culture 1,000 COLONIES/mL INSIGNIFICANT GROWTH (A)  Final   Report Status 02/12/2016 FINAL  Final  Surgical pcr screen     Status: Abnormal   Collection Time: 02/17/16  5:30 AM  Result Value Ref Range Status   MRSA, PCR NEGATIVE NEGATIVE Final   Staphylococcus aureus POSITIVE (A) NEGATIVE Final    Comment:        The Xpert SA Assay (FDA approved for NASAL specimens in patients over 46 years of age), is one component of a comprehensive surveillance program.  Test performance has been validated by [facility] for patients greater than or equal to 55 year old. It is not intended to diagnose infection nor to guide or monitor treatment.      Scheduled Meds: . amiodarone  400 mg Oral Daily  . carvedilol  37.5 mg Oral BID  WC  . docusate sodium  100 mg Oral BID  . heparin subcutaneous  5,000 Units Subcutaneous Q8H  . insulin aspart  0-5 Units Subcutaneous QHS  . insulin aspart  0-9 Units Subcutaneous TID WC  . isosorbide-hydrALAZINE  0.5 tablet Oral TID  . levothyroxine  75 mcg Oral QAC breakfast  . polyethylene glycol  17 g Oral Daily  . sodium chloride flush  3 mL Intravenous Q12H   Continuous Infusions: . sodium chloride 10 mL/hr at 02/11/16 1757     LOS: 17 days   Time spent: 25 minutes   Cherene Altes, MD [facility] Office  [phone] Pager - Text Page per Shea Evans as per below:  On-Call/Text Page:      [email or web address]      password [email or web address]  If 7PM-7AM, please contact night-coverage [email or web address] Password [email or web address] 02/18/2016, 11:12  AM

## 2016-02-18 NOTE — Clinical Social Work Note (Signed)
Clinical Social Work Assessment  Patient Details  Name: Eric [name] MRN: [id number] Date of Birth: Jun 25, 1951  Date of referral:  02/18/16               Reason for consult:                   Permission sought to share information with:  Facility Sport and exercise psychologist, Family Supports Permission granted to share information::  Yes, Verbal Permission Granted  Name::     Von  Agency::  SNFs  Relationship::  Wife  Contact Information:     Housing/Transportation Living arrangements for the past 2 months:  Single Family Home Source of Information:  Patient Patient Interpreter Needed:  None Criminal Activity/Legal Involvement Pertinent to Current Situation/Hospitalization:  Yes Significant Relationships:  Spouse Lives with:  Spouse Do you feel safe going back to the place where you live?  Yes Need for family participation in patient care:  Yes (Comment)  Care giving concerns:  Patient reports that he would prefer inpatient rehab, but is agreeable to alternate plan of short term rehab at a SNF.   Social Worker assessment / plan:  CSW met with patient at beside to complete assessment. Patient was resting comfortably in bed. CSW explained PT recommendation for CIR placement. CSW explained that an alternate plan of SNFs placement may be needed. Patient reported his support as his wife Von. Per patient request CSW called patient's wife. CSW explained PT recommendation for CIR placement. CSW explained that an alternate plan of SNFs placement may be needed. CSW explained SNF search and placement process to the patient's wife and answered her questions.  CSW will follow up with bed offers.  Employment status:  Retired Nurse, adult PT Recommendations:  Inpatient [place] / Referral to community resources:  [place]  Patient/Family's Response to care:  The patient appears happy with the care she is receiving in hospital and is  appreciative of CSW assistance.  Patient/Family's Understanding of and Emotional Response to Diagnosis, Current Treatment, and Prognosis:  The patient has a good understanding of why he was admitted to the hospital.  He understands the recommendation of PT. Patient stated he would prefer inpatient rehab, but is agreeable with rehab at a SNF if needed.   Emotional Assessment Appearance:  Appears stated age Attitude/Demeanor/Rapport:   (Patient was welcoming of CSW and appropriate. ) Affect (typically observed):  Accepting, Calm, Appropriate Orientation:  Oriented to Self, Oriented to Place, Oriented to  Time, Oriented to Situation Alcohol / Substance use:  Not Applicable Psych involvement (Current and /or in the community):  No (Comment)  Discharge Needs  Concerns to be addressed:  Discharge Planning Concerns Readmission within the last 30 days:  No Current discharge risk:  Physical Impairment Barriers to Discharge:  Continued Medical Work up   [employer], LCSW 02/18/2016, 3:25 PM

## 2016-02-18 NOTE — Evaluation (Signed)
Occupational Therapy Evaluation Patient Details Name: Eric Eric [name] MRN: [id number] DOB: 1951/02/16 Today's Date: 02/18/2016    History of Present Illness 65 y.o. Eric [name] with known sCHF and A.fib, presented 05/08 after out of hospital cardiac arrest. ROSC within 15-20 minutes after defib x 2 (vtach to vfib suspected). ETT 5/8-5/14. Now with acute encephalopathy with concern for anoxia per MD notes. 5/17 noted incr bil leg weakness and back pain; 5/19 MRI Bilateral retroperitoneal hemorrhage involving the psoas muscles; 5/22 neuro consult and determined decompression of nerves not indicated; 5/25 cardiac cath and s/p BiV ICD implant   Clinical Impression   Pt admitted with above. He demonstrates the below listed deficits and will benefit from continued OT to maximize safety and independence with BADLs.  Pt presents to OT with generalized weakness, impaired balance.  Cognition appears intact for basic skills - will benefit from further assessment.  Currently, he requires mod - max A for ADLs, but is very motivated, and should progress nicely.  Recommend CIR.       Follow Up Recommendations  CIR;Supervision/Assistance - 24 hour    Equipment Recommendations  3 in 1 bedside comode;Tub/shower bench    Recommendations for Other Services Rehab consult     Precautions / Restrictions Precautions Precautions: Fall;ICD/Pacemaker Precaution Comments: Spoke with EP PA re: heavy use of LUE due to LE weakness. She emphasized to avoid movement at shoulder (flexion, abdct, reaching behind/internal rotation/extension) or stretching of upper chest are primary concerns to avoid. Agreed he could weightbear through LUE with elbow near/at his side.  Restrictions Weight Bearing Restrictions: No      Mobility Bed Mobility Overal bed mobility: Needs Assistance;+2 for physical assistance Bed Mobility: Supine to Sit     Supine to sit: Mod assist;+2 for physical assistance;HOB elevated     General bed  mobility comments: unable to roll to Lt due to new pacemaker; elevated HOB and assisted to turn to sit at EOB  Transfers Overall transfer level: Needs assistance   Transfers: Stand Pivot Transfers   Stand pivot transfers: Total assist;+2 safety/equipment (sara-plus)       General transfer comment:  PT present; [name] (not on platform) to avoid shoulder flexion    Balance Overall balance assessment: Needs assistance Sitting-balance support: No upper extremity supported Sitting balance-Leahy Scale: Fair                                      ADL Overall ADL's : Needs assistance/impaired Eating/Feeding: Set up;Sitting   Grooming: Wash/dry hands;Wash/dry face;Oral care;Brushing hair;Set up;Sitting   Upper Body Bathing: Moderate assistance;Sitting   Lower Body Bathing: Maximal assistance;Sit to/from stand   Upper Body Dressing : Moderate assistance;Sitting   Lower Body Dressing: Total assistance;Sit to/from stand   Toilet Transfer: Stand-pivot;BSC;Maximal Print production planner Details (indicate cue type and reason): utilizing sara plus  Toileting- Clothing Manipulation and Hygiene: Total assistance;Sit to/from stand       Functional mobility during ADLs: Maximal assistance       Vision Vision Assessment?: No apparent visual deficits   Perception Perception Perception Tested?: Yes   Praxis Praxis Praxis tested?: Within functional limits    Pertinent Vitals/Pain Pain Assessment: No/denies pain     Hand Dominance Right   Extremity/Trunk Assessment Upper Extremity Assessment Upper Extremity Assessment: Generalized weakness;LUE deficits/detail LUE Deficits / Details: elbow distally.  Lt shoulder NT due to ICD/pacemaker precautions  Lower Extremity Assessment Lower Extremity Assessment: Generalized weakness   Cervical / Trunk Assessment Cervical / Trunk Assessment: Kyphotic   Communication  Communication Communication: No difficulties   Cognition Arousal/Alertness: Awake/alert Behavior During Therapy: WFL for tasks assessed/performed Overall Cognitive Status: Within Functional Limits for tasks assessed (for basic cognition)                 General Comments: will benefit from further cognitive assessment    General Comments       Exercises Exercises: Other exercises;General Lower Extremity Other Exercises Other Exercises: Lt hip internal rotation; actively and then PROM stretch   Shoulder Instructions      Home Living Family/patient expects to be discharged to:: Inpatient rehab Living Arrangements: Spouse/significant other Available Help at Discharge: Family;Available 24 hours/day Type of Home: House Home Access: Stairs to enter [employer] of Steps: 3 Entrance Stairs-Rails: None Home Layout: One level           Bathroom Accessibility: Yes   Home Equipment: None          Prior Functioning/Environment Level of Independence: Independent        Comments: 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 is his Environmental education officer     OT Diagnosis: Generalized weakness;Cognitive deficits (cognition to be further assessed )   OT Problem List: Decreased strength;Decreased range of motion;Impaired balance (sitting and/or standing);Decreased activity tolerance;Decreased cognition;Decreased safety awareness;Decreased knowledge of use of DME or AE;Pain;Impaired UE functional use   OT Treatment/Interventions: Self-care/ADL training;Therapeutic exercise;Neuromuscular education;DME and/or AE instruction;Therapeutic activities;Cognitive remediation/compensation;Patient/family education;Balance training    OT Goals(Current goals can be found in the care plan section) Acute Rehab OT Goals Patient Stated Goal: he hopes to return to work, but is realistic that it may or may not be a possiblity  OT Goal Formulation: With patient Time For Goal  Achievement: 03/03/16 Potential to Achieve Goals: Good ADL Goals Pt Will Perform Grooming: with mod assist;standing Pt Will Perform Upper Body Bathing: with set-up;sitting Pt Will Perform Lower Body Bathing: with mod assist;with adaptive equipment;sit to/from stand Pt Will Perform Upper Body Dressing: with set-up;sitting Pt Will Perform Lower Body Dressing: with mod assist;with adaptive equipment;sit to/from stand Pt Will Transfer to Toilet: with mod assist;stand pivot transfer;bedside commode Pt Will Perform Toileting - Clothing Manipulation and hygiene: with mod assist;sit to/from stand Additional ADL Goal #1: Pt will participate in further cognitive assessment   OT Frequency: Min 3X/week   Barriers to D/C:            Co-evaluation              End of Session Equipment Utilized During Treatment: Other (comment) Clarise Cruz Plus ) Nurse Communication: Mobility status;Need for lift equipment  Activity Tolerance: Patient tolerated treatment well Patient left: in chair;with call bell/phone within reach   Time: [id number] OT Time Calculation (min): 49 min Charges:  OT General Charges $OT Visit: 1 Procedure OT Evaluation $OT Eval Moderate Complexity: 1 Procedure OT Treatments $Therapeutic Activity: 8-22 mins G-Codes:    Eric Eric [name] Mar 17, 2016, 7:40 PM

## 2016-02-18 NOTE — Progress Notes (Signed)
SUBJECTIVE: The patient is doing well today.  At this time, he denies chest pain, shortness of breath, or any new concerns. Denies any significant pain at the ICD site.   Marland Kitchen amiodarone  400 mg Oral Daily  . carvedilol  37.5 mg Oral BID WC  .  ceFAZolin (ANCEF) IV  1 g Intravenous Q6H  . docusate sodium  100 mg Oral BID  . heparin subcutaneous  5,000 Units Subcutaneous Q8H  . insulin aspart  0-5 Units Subcutaneous QHS  . insulin aspart  0-9 Units Subcutaneous TID WC  . isosorbide-hydrALAZINE  0.5 tablet Oral TID  . levothyroxine  75 mcg Oral QAC breakfast  . polyethylene glycol  17 g Oral Daily  . sodium chloride flush  3 mL Intravenous Q12H   . sodium chloride 10 mL/hr at 02/11/16 1757    OBJECTIVE: Physical Exam: Filed Vitals:   02/18/16 0600 02/18/16 0700 02/18/16 0800 02/18/16 0826  BP: 142/84 134/69 136/81   Pulse: 88 87 89   Temp:    98.3 F (36.8 C)  TempSrc:    Oral  Resp: 16 17 11    Height:      Weight:      SpO2: 99% 100% 100%     Intake/Output Summary (Last 24 hours) at 02/18/16 0929 Last data filed at 02/18/16 0800  Gross per 24 hour  Intake    560 ml  Output   1025 ml  Net   -465 ml    Telemetry reveals  SR 80's-100  GEN- The patient is in NAD, alert and oriented x 3 today.   Head- normocephalic, atraumatic Eyes-  Sclera clear, conjunctiva pink Ears- hearing intact Oropharynx- clear Neck- supple Lungs- Clear to ausculation bilaterally, normal work of breathing Heart- Regular rate and rhythm, 2/6SM, no rubs or gallops GI- soft, NT, ND Extremities- no clubbing, cyanosis, or edema Skin- no rash or lesion Psych- euthymic mood, full affect Neuro- no gross deficits appreciated  ICD site dressing is dry, no hematoma  LABS: Basic Metabolic Panel:  Recent Labs  02/17/16 0337 02/17/16 1903 02/18/16 0234  NA 133*  --  133*  K 4.3  --  4.4  CL 107  --  108  CO2 21*  --  21*  GLUCOSE 118*  --  114*  BUN 32*  --  28*  CREATININE 1.55* 1.49*  1.53*  CALCIUM 8.4*  --  8.2*  MG 2.1  --  2.0  PHOS 2.7  --  3.2   CBC:  Recent Labs  02/17/16 0337 02/17/16 1903  WBC 14.3* 9.8  HGB 9.2* 10.0*  HCT 29.0* 31.6*  MCV 94.5 93.8  PLT 218 233   Cardiac Enzymes:  Recent Labs  02/16/16 0532  CKTOTAL 776*      ASSESSMENT AND PLAN:  Assessment and Plan:  1. Cardiac arrest witnessed out of hospital on 01/31/16 with CPR EMS record reviewed. FD first to respond applied AED and advised shock, patient received 2 shocks followed by further CPR with ROSC      AED rhythm reviewed, was VF  Intubated in ED   s/p Hypothermia protocol, rewarming completed 02/03/16   Chronic LBBB  EF now 30-35% (40-45% only 3 weeks ago)  schemic w/u (cath) pending 02/01/16 IV amiodarone stopped secondary to bradycardia, 2nd degree AVBlock/wenckebach with variable conduction, 2:1 in the 30's noted, and prolonged QT in environment of K+ 3.0   WCT reported 5/13 PM felt to have been AF by notes and placed  back on amio gtt.   BB re-started 02/06/16  Extubated 02/06/16 Amio gtt resumed, felt bradycardia/heart block 5/9 in the environment of cooling and electrolyte imbalance.  QT felt stable by Dr. Caryl Comes at that time (02/07/16) and has since been transition to PO amiodarone, on 400mg  daily.     NSVT mentioned 02/10/16 by cardiology note  No significant CAD on his LHC, now is s/p CRT-D implant yesterday by Dr. Lovena Le Implant site is stable ICD check this morning with intact function CXR this morning is without pneumothorax Wound care and activity restrictions discussed with the patient and PT No driving for 6 months given VF arrest, patient informed  3. Pneumonia  Extubated, 02/06/16  speech therapy has seen patient, aphonia s/p prolonged intibation, much better  off/completed abx   IM following  3. Acute on chronic renal insuff.  nephrology called to the case   Creat peak 3.53 today is 1.53  4. Aortic  root dilation     Cardiology plans CT Angio in 3mo to follow  Aortic insuff is worse, >> Severe on echo here  is tri leaflet  TEE 02/08/16 revealed moderate aortic regurgitation with eccentric jet. Aortic root 5.2cm.    5. AFib (described as persistent in notes)  SR currently/PAF here  CHA2DS2Vasc is at least 2 on Eliquis out patient     Heparin gtt here stopped secondary to hemorrhage  6. HTN     C/w primary team  7. CM     EF by his TEE 02/09/16 was 30-35%     LBBB     He is euvolemic fluid neg by his I/O's and exam  8. Spontaneous hemorrhage psoas musculature b/l on 02/13/16     Acute blood loss anemia     Hgb down to 6.7 and is s/p PRBC x2 units, yesterday was 9.2     CK elevation max was 6520, last was 776 (statin has been held)     Remains with LE weakness, neurology on case recommend consideration for surgical decompression of femoral nerve and/or PT     Medicine's note describes improving function and in d/w the patient holding off on surgical intervention  9. Leukocytosis     Peak 26.6, down to 14.3 yesterday     Aspiration pneumonia     hemorrhage       Tommye Standard, PA-C 02/18/2016 9:29 AM    EP Attending  Patient seen and examined. Agree with above. He is stable s/p BiV ICD implant. Continue followup as per Dr. Shirlee More. He can be discharged home when ok with primary team. Usual EP followup. His CXR is good.   Mikle Bosworth.D.

## 2016-02-18 NOTE — NC FL2 (Signed)
[place] MEDICAID FL2 LEVEL OF CARE SCREENING TOOL     IDENTIFICATION  Patient Name: Eric [name] Birthdate: Feb 01, 1951 Sex: male Admission Date (Current Location): 01/31/2016  [facility] and [place] Number:  Herbalist and Address:  [facility], [place] [address], [place], [place] [address]      Provider Number: [id number]  Attending Physician Name and Address:  Cherene Altes, MD  Relative Name and Phone Number:       Current Level of Care: Hospital Recommended Level of Care: [place] Prior Approval Number:    Date Approved/Denied:   PASRR Number: [id number]  Discharge Plan: SNF    Current Diagnoses: Patient Active Problem List   Diagnosis Date Noted  . Femoral neuropathy   . Weakness of both lower extremities   . Lower extremity weakness   . Pneumonia   . Acute lumbar back pain   . Non-traumatic rhabdomyolysis   . Retroperitoneal bleed   . Back pain   . Leg weakness, bilateral   . Paroxysmal atrial fibrillation ([place])   . Ventricular fibrillation ([place]) 02/09/2016  . Aortic aneurysm without rupture ([place]) 02/09/2016  . Aortic insufficiency 02/09/2016  . HCAP (healthcare-associated pneumonia)   . AKI (acute kidney injury) ([place])   . Cardiac arrest ([place]) 02/01/2016  . Acute on chronic systolic heart failure ([place])   . Acute encephalopathy   . CKD (chronic kidney disease)   . Thyroid activity decreased   . Acute hypoxemic respiratory failure ([place])   . Acute respiratory failure ([place])   . Encounter for central line placement   . Arrhythmia   . Altered mental status   . CKD (chronic kidney disease), stage IV ([place]) 08/06/2013    Class: Chronic  . Long term (current) use of anticoagulants 08/06/2013    Class: Chronic  . Special screening for malignant neoplasms, colon 04/11/2013  . Colon cancer screening 03/04/2013  . Chronic anticoagulation 03/04/2013  . Atrial flutter ([place]) 03/18/2012    Class: Acute  .  Chronic systolic heart failure ([place]) 02/13/2012    Class: Acute  . Hypertension, accelerated 02/13/2012  . Aortic valve regurgitation, acquired 02/13/2012  . Aortic root enlargement ([place]) 02/13/2012  . Hyperlipidemia 02/13/2012  . Left bundle branch block 02/13/2012  . Obesity (BMI 30-39.9) 02/13/2012    Orientation RESPIRATION BLADDER Height & Weight     Self, Time, Situation, Place  Normal Continent Weight: 240 lb 4.8 oz (109 kg) Height:  5\' 9"  (175.3 cm)  BEHAVIORAL SYMPTOMS/MOOD NEUROLOGICAL BOWEL NUTRITION STATUS   (None)  (None) Continent Diet (Heart and Carb Modified)  AMBULATORY STATUS COMMUNICATION OF NEEDS Skin   Extensive Assist Verbally Surgical wounds (LT Upper Chest)                       Personal Care Assistance Level of Assistance  Bathing, Feeding, Dressing Bathing Assistance: Limited assistance Feeding assistance: Independent Dressing Assistance: Limited assistance     Functional Limitations Info  Sight, Hearing, Speech Sight Info: Adequate Hearing Info: Adequate Speech Info: Adequate    SPECIAL CARE FACTORS FREQUENCY  PT (By licensed PT), OT (By licensed OT)     PT Frequency: 5/ week OT Frequency: 5/ week            Contractures Contractures Info: Not present    Additional Factors Info  Code Status, Allergies, Insulin Sliding Scale Code Status Info: Full Allergies Info: LACTOSE INTOLERANCE (GI)   Insulin Sliding Scale Info:  insulin aspart (novoLOG) injection 0-5 Units Dose: 0-5 Units Freq: Daily at bedtime Route: [place]       Current Medications (02/18/2016):  This is the current hospital active medication list Current Facility-Administered Medications  Medication Dose Route Frequency Provider Last Rate Last Dose  . 0.9 %  sodium chloride infusion   Intravenous Continuous Collene Gobble, MD 10 mL/hr at 02/11/16 1757    . acetaminophen (TYLENOL) tablet 325-650 mg  325-650 mg Oral Q4H PRN Evans Lance, MD      . amiodarone (PACERONE)  tablet 400 mg  400 mg Oral Daily Peter M Martinique, MD   400 mg at 02/18/16 1051  . bisacodyl (DULCOLAX) EC tablet 5 mg  5 mg Oral Daily PRN Marijean Heath, NP   5 mg at 02/06/16 1506  . bisacodyl (DULCOLAX) suppository 10 mg  10 mg Rectal Daily PRN Marijean Heath, NP      . carvedilol (COREG) tablet 25 mg  25 mg Oral BID WC Cherene Altes, MD      . docusate sodium (COLACE) capsule 100 mg  100 mg Oral BID Marijean Heath, NP   100 mg at 02/18/16 1051  . heparin injection 5,000 Units  5,000 Units Subcutaneous Q8H Evans Lance, MD   5,000 Units at 02/18/16 1321  . insulin aspart (novoLOG) injection 0-5 Units  0-5 Units Subcutaneous QHS Dianne Dun, NP   2 Units at 02/13/16 2210  . insulin aspart (novoLOG) injection 0-9 Units  0-9 Units Subcutaneous TID WC Dianne Dun, NP   2 Units at 02/18/16 1300  . isosorbide-hydrALAZINE (BIDIL) 20-37.5 MG per tablet 0.5 tablet  0.5 tablet Oral TID Sanda Klein, MD   0.5 tablet at 02/18/16 1051  . levothyroxine (SYNTHROID, LEVOTHROID) tablet 75 mcg  75 mcg Oral QAC breakfast Collene Gobble, MD   75 mcg at 02/18/16 0835  . metoprolol (LOPRESSOR) injection 5 mg  5 mg Intravenous Q5 min PRN Norman Herrlich, MD      . morphine 2 MG/ML injection 1-2 mg  1-2 mg Intravenous Q2H PRN Marijean Heath, NP   2 mg at 02/14/16 1645  . ondansetron (ZOFRAN) injection 4 mg  4 mg Intravenous Q6H PRN Evans Lance, MD      . oxyCODONE-acetaminophen (PERCOCET/ROXICET) 5-325 MG per tablet 1 tablet  1 tablet Oral Q6H PRN Donita Brooks, NP   1 tablet at 02/17/16 0008  . polyethylene glycol (MIRALAX / GLYCOLAX) packet 17 g  17 g Oral Daily Marijean Heath, NP   17 g at 02/18/16 1051     Discharge Medications: Please see discharge summary for a list of discharge medications.  Relevant Imaging Results:  Relevant Lab Results:   Additional Information [id number]  Samule Dry, LCSW

## 2016-02-18 NOTE — Progress Notes (Signed)
Patient ID: Eric [name], male   DOB: 03/27/1951, 65 y.o.   MRN: [id number]  [facility] Progress Note   Assessment/ Plan:   1. AKI on chronic kidney disease stage IV (baseline creatinine 1.8-2.0): Nonoliguric. Renal function stable at/around his baseline with fair urine output. Appears to have had angiogram done yesterday however results are not posted-he reports that he had no significant lesions and then underwent ICD placement yesterday. No evidence of CINP at the 24hr mark--will follow and see labs again tomorrow for delayed effect.  2. Rhabdomyolysis: CPK levels lower yesterday- 700 down from 7000. Back pain better. 3. Cardiac arrest: Presumed V. tach/V. fib with V. fib seen on AED interrogation. Underwent ICD placement yesterday. 4. Chronic systolic heart failure: EF of 30-35%. Furosemide currently on hold. On BiDil/Coreg 5. Hypertension: Blood pressure currently acceptable-continue to monitor.  Subjective:   RLE weakness persists, LLE strength better- denies any CP/SOB.    Objective:   BP 136/81 mmHg  Pulse 89  Temp(Src) 98.3 F (36.8 C) (Oral)  Resp 11  Ht 5\' 9"  (1.753 m)  Wt 109 kg (240 lb 4.8 oz)  BMI 35.47 kg/m2  SpO2 100%  Intake/Output Summary (Last 24 hours) at 02/18/16 [id number] Last data filed at 02/18/16 0800  Gross per 24 hour  Intake    560 ml  Output   1025 ml  Net   -465 ml   Weight change: -4.1 kg (-9 lb 0.6 oz)  Physical Exam: [id number] sleeping in bed  CVS: Pulse regular rhythm, normal rate, S1 and S2 normal. Resp: Clear to auscultation bilaterally-no rales/rhonchi Abd: Soft, flat, nontender Ext: No lower extremity edema  Imaging: No results found.  Labs: BMET  Recent Labs Lab 02/12/16 [id number] 02/13/16 [id number] 02/14/16 0310 02/15/16 0222 02/16/16 0532 02/17/16 0337 02/17/16 1903 02/18/16 0234  NA 145 143 143 137 135 133*  --  133*  K 4.1 4.0 4.1 4.1 4.2 4.3  --  4.4  CL 105 107 112* 108 108 107  --  108  CO2 27 27 26 22   20* 21*  --  21*  GLUCOSE 166* 179* 141* 155* 130* 118*  --  114*  BUN 100* 72* 51* 42* 38* 32*  --  28*  CREATININE 2.80* 2.22* 1.83* 1.05 1.60* 1.55* 1.49* 1.53*  CALCIUM 8.2* 8.3* 8.3* 8.3* 8.4* 8.4*  --  8.2*  PHOS  --   --  2.3*  --   --  2.7  --  3.2   CBC  Recent Labs Lab 02/11/16 1930  02/12/16 0428  02/13/16 0322  02/14/16 0310 02/15/16 0222 02/16/16 0532 02/17/16 0337 02/17/16 1903  WBC 24.3*  --  23.3*  --  22.1*  --  20.4* 17.3* 15.4* 14.3* 9.8  NEUTROABS 19.7*  --  17.9*  --  17.3*  --  15.7*  --   --   --   --   HGB 9.1*  < > 8.6*  < > 8.6*  < > 8.7* 9.0* 9.2* 9.2* 10.0*  HCT 27.8*  < > 26.3*  < > 27.4*  < > 27.4* 28.4* 28.9* 29.0* 31.6*  MCV 90.6  --  91.0  --  94.2  --  95.5 93.7 94.4 94.5 93.8  PLT 294  --  277  --  262  --  229 213 222 218 233  < > = values in this interval not displayed.  Medications:    . amiodarone  400 mg Oral Daily  . carvedilol  37.5 mg Oral BID WC  .  ceFAZolin (ANCEF) IV  1 g Intravenous Q6H  . docusate sodium  100 mg Oral BID  . heparin subcutaneous  5,000 Units Subcutaneous Q8H  . insulin aspart  0-5 Units Subcutaneous QHS  . insulin aspart  0-9 Units Subcutaneous TID WC  . isosorbide-hydrALAZINE  0.5 tablet Oral TID  . levothyroxine  75 mcg Oral QAC breakfast  . polyethylene glycol  17 g Oral Daily  . sodium chloride flush  3 mL Intravenous Q12H   Elmarie Shiley, MD 02/18/2016, 9:22 AM

## 2016-02-18 NOTE — Progress Notes (Signed)
Patient Name: Eric [name] Date of Encounter: 02/18/2016  Principal Problem:   Cardiac arrest [facility]) Active Problems:   Chronic systolic heart failure ([place])   Hyperlipidemia   Left bundle branch block   Obesity (BMI 30-39.9)   Atrial flutter ([place])   Acute respiratory failure ([place])   Encounter for central line placement   Arrhythmia   Altered mental status   HCAP (healthcare-associated pneumonia)   AKI (acute kidney injury) ([place])   Ventricular fibrillation ([facility])   Aortic aneurysm without rupture ([facility])   Aortic insufficiency   Back pain   Leg weakness, bilateral   Paroxysmal atrial fibrillation ([facility])   Pneumonia   Acute lumbar back pain   Non-traumatic rhabdomyolysis   Retroperitoneal bleed   Weakness of both lower extremities   Lower extremity weakness   Femoral neuropathy   Length of Stay: 17  SUBJECTIVE No complaints this morning.    Tele: sinus rhythm   CURRENT MEDS . amiodarone  400 mg Oral Daily  . carvedilol  37.5 mg Oral BID WC  .  ceFAZolin (ANCEF) IV  1 g Intravenous Q6H  . docusate sodium  100 mg Oral BID  . heparin subcutaneous  5,000 Units Subcutaneous Q8H  . insulin aspart  0-5 Units Subcutaneous QHS  . insulin aspart  0-9 Units Subcutaneous TID WC  . isosorbide-hydrALAZINE  0.5 tablet Oral TID  . levothyroxine  75 mcg Oral QAC breakfast  . polyethylene glycol  17 g Oral Daily  . sodium chloride flush  3 mL Intravenous Q12H    OBJECTIVE   Intake/Output Summary (Last 24 hours) at 02/18/16 0739 Last data filed at 02/18/16 0700  Gross per 24 hour  Intake    780 ml  Output   1075 ml  Net   -295 ml   Filed Weights   02/16/16 0341 02/17/16 0408 02/18/16 0500  Weight: 256 lb 6.3 oz (116.3 kg) 249 lb 5.4 oz (113.1 kg) 240 lb 4.8 oz (109 kg)    PHYSICAL EXAM Filed Vitals:   02/18/16 0400 02/18/16 0500 02/18/16 0600 02/18/16 0700  BP: 134/80 152/78 142/84 134/69  Pulse: 102 93 88 87  Temp: 98.7 F (37.1 C)     TempSrc: Oral     Resp: 0  24 16 17   Height:      Weight:  240 lb 4.8 oz (109 kg)    SpO2: 98% 100% 99% 100%   General: Alert, oriented x3, no distress Head: EOMI, moist mucous membranes Pulm: clear to auscultation, no crackles or rhonchi Cardiovascular: RRR, no murmurs or gallops Abdomen: no tenderness or distention, no masses by palpation, no abnormal pulsatility or arterial bruits, normal bowel sounds, no hepatosplenomegaly Extremities: no clubbing, cyanosis or 1+ edema b/l Neurological: CN 2-12 grossly intact Skin: warm and dry  LABS  CBC  Recent Labs  02/17/16 0337 02/17/16 1903  WBC 14.3* 9.8  HGB 9.2* 10.0*  HCT 29.0* 31.6*  MCV 94.5 93.8  PLT 218 0000000   Basic Metabolic Panel  Recent Labs  02/17/16 0337 02/17/16 1903 02/18/16 0234  NA 133*  --  133*  K 4.3  --  4.4  CL 107  --  108  CO2 21*  --  21*  GLUCOSE 118*  --  114*  BUN 32*  --  28*  CREATININE 1.55* 1.49* 1.53*  CALCIUM 8.4*  --  8.2*  MG 2.1  --  2.0  PHOS 2.7  --  3.2   Liver Function Tests  Recent  Labs  02/17/16 0337 02/18/16 0234  AST 64*  --   ALT 41  --   ALKPHOS 60  --   BILITOT 1.6*  --   PROT 5.9*  --   ALBUMIN 2.2*  2.3* 2.3*   Cardiac Enzymes  Recent Labs  02/16/16 0532  CKTOTAL 34*    Radiology Studies CT ABDOMEN AND PELVIS WITHOUT CONTRAST  TECHNIQUE: Multidetector CT imaging of the abdomen and pelvis was performed following the standard protocol without IV contrast.  COMPARISON: MRI of the lumbar spine performed 02/11/2016  FINDINGS: Minimal bibasilar atelectasis is noted.  The liver and spleen are unremarkable in appearance. The gallbladder is within normal limits. The pancreas and adrenal glands are unremarkable.  Nonspecific perinephric stranding is noted bilaterally. A 1.5 cm cyst is noted at the lower pole of the right kidney. The kidneys are otherwise unremarkable. There is no evidence of hydronephrosis. No renal or ureteral stones are identified.  No free fluid  is identified. The small bowel is unremarkable in appearance. The stomach is within normal limits. No acute vascular abnormalities are seen. There is mild ectasia of the distal abdominal aorta and common iliac arteries bilaterally, without evidence of significant aneurysmal dilatation.  The appendix is normal in caliber and contains contrast, without evidence of appendicitis. The colon is grossly unremarkable in appearance.  The bladder is mildly distended and grossly unremarkable. The prostate is borderline normal in size. No inguinal lymphadenopathy is seen.  Hemorrhage is noted along the psoas musculature bilaterally, and along the iliacus musculature, with overlying hemorrhage, more prominent on the left. Mild hemorrhage is also seen tracking into the proximal lower extremities bilaterally. Mild soft tissue inflammation is noted overlying the psoas musculature.  No acute osseous abnormalities are identified. Facet disease is noted at the lower lumbar spine.  IMPRESSION: 1. Hemorrhage along the psoas musculature bilaterally, and along the iliacus musculature, with overlying hemorrhage, more prominent on the left. Mild hemorrhage tracking into the proximal lower extremities bilaterally. Mild soft tissue inflammation overlying the psoas musculature. 2. Small left renal cyst noted. 3. Mild ectasia of the distal abdominal aorta and common iliac arteries bilaterally, without significant aneurysmal dilatation.   Electronically Signed  By: Garald Balding M.D.  On: 02/14/2016 04:07    ASSESSMENT AND PLAN  1. S/P cardiac arrest, presumed VT/VF- s/p LHC yesterday that was revealed small branch of 2nd diagonal 99% stenosed, otherwise nonobstructive CAD. He also is s/p CRT-D placement w/o complication. Renal function stable. Consider transferring to tele floor.   2. Chronic systolic heart failure-TTE 5/17 reveals EF of [id number], grade 2 diastolic dysfunction. Holding lasix  due to AKI. Volume status looks normal. Continue carvedilol and Bidil. LVEDP wnl on cardiac cath. Can start ACEi/ARB pending renal function s/p cath procedure  3. 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 TEE 02/08/16 revealed moderate aortic regurgitation with eccentric jet. Aortic root 5.2cm. Consider CT in 6 months.  4. Atrial fibrillation-- now in sinus rhythm. On home coreg 25mg  BID. Off anticoagulants due to retroperitoneal bleeding.  Continue amiodarone po 400mg  daily.   5. Hypertension- controlled, continue coreg, resumed bidil 20-37.5mg  yesterday. BP runs low at times- will not increase dose yet.  6. Aortic root enlargement - aortic root measures 37mm on TEE -May benefit CT Angio when renal function is clearly stabilized.  7. 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 Left bundle branch block- s/p CRTD 5/25 9. Hyperlipidemia -  will resume statin when rhabdo resolves.   10. Possible aspiration pneumonia- completed course of abx. CXR 5/18 neg for infection.   11. Leukocytosis -- resolved  12. Anemia-- hgb 7.1 previously from 9.5 secondary to retroperitoneal bleed, discovered by MRI back.  - Status post 2 units of blood with diuretics  - Continue to hold heparin IV (was placed in the setting of cardioversion) - consider iron supplements.    Julious Oka, MD   02/18/2016 7:39 AM Patient seen and examined and history reviewed. Agree with above findings and plan. Patient is doing well post cath and CRT-D yesterday. ICD site is mildly swollen. No redness. Lungs are clear. Feet are edematous. EDP at cath was normal 12-14 mm Hg. Renal function is stable. Fortunately he did not have major CAD on cath. May transfer to telemetry today. Advance activity with PT. Consider resuming ACEi or ARB when ok with Renal. Would hold starting ASA until seen in follow up as outpatient. With recent  bleed he is not a candidate for anticoagulation.   Peter Martinique, [place] 02/18/2016 9:18 AM

## 2016-02-19 DIAGNOSIS — I469 Cardiac arrest, cause unspecified: Secondary | ICD-10-CM | POA: Diagnosis not present

## 2016-02-19 LAB — COMPREHENSIVE METABOLIC PANEL
ALBUMIN: 2.1 g/dL — AB (ref 3.5–5.0)
ALK PHOS: 63 U/L (ref 38–126)
ALT: 21 U/L (ref 17–63)
AST: 52 U/L — AB (ref 15–41)
Anion gap: 9 (ref 5–15)
BUN: 30 mg/dL — AB (ref 6–20)
CALCIUM: 8.3 mg/dL — AB (ref 8.9–10.3)
CO2: 19 mmol/L — AB (ref 22–32)
CREATININE: 1.5 mg/dL — AB (ref 0.61–1.24)
Chloride: 106 mmol/L (ref 101–111)
GFR calc Af Amer: 55 mL/min — ABNORMAL LOW (ref >60)
GFR calc non Af Amer: 47 mL/min — ABNORMAL LOW (ref >60)
GLUCOSE: 122 mg/dL — AB (ref 65–99)
Potassium: 4.3 mmol/L (ref 3.5–5.1)
SODIUM: 134 mmol/L — AB (ref 135–145)
Total Bilirubin: 1 mg/dL (ref 0.3–1.2)
Total Protein: 5.9 g/dL — ABNORMAL LOW (ref 6.5–8.1)

## 2016-02-19 LAB — GLUCOSE, CAPILLARY
GLUCOSE-CAPILLARY: 155 mg/dL — AB (ref 65–99)
Glucose-Capillary: 135 mg/dL — ABNORMAL HIGH (ref 65–99)
Glucose-Capillary: 131 mg/dL — ABNORMAL HIGH (ref 65–99)
Glucose-Capillary: 107 mg/dL — ABNORMAL HIGH (ref 65–99)

## 2016-02-19 MED ORDER — FUROSEMIDE 40 MG PO TABS
40.0000 mg | ORAL_TABLET | Freq: Every day | ORAL | Status: DC
  Administered 2016-02-19 – 2016-02-22 (×4): 40 mg via ORAL
  Filled 2016-02-19 (×4): qty 1

## 2016-02-19 NOTE — Progress Notes (Signed)
[facility] TEAM 1 - Stepdown/ICU TEAM  Eric [name]  [id number] DOB: [birth year] DOA: 01/31/2016 PCP: [name], MD    Brief Narrative:  65 yo M Hx Chronic Systolic CHF, A-fib on Eliquis, HTN, Heart murmur, CKD, Hypothyroidism, and Chronic Pain Syndrome (low back) who was in his USOH until the evening of 05/08 when he began to breath very heavily while driving, before becoming unresponsive. His wife immediately got the car stopped, got him out of the car and began CPR while calling EMS.  EMS arrived within 5 - 10 minutes and upon their arrival, he received 2 defibrillations via AED. ROSC was achieved within roughly 15 - 20 minutes.  Upon ED arrival, he remained unresponsive and was subsequently intubated. PCCM was called for admission and consideration of hypothermia protocol. Cardiology was also called in consultation.  Assessment & Plan:  Cardiac arrest - VT/Vfib -CPR performed by wife and EMS - multiple shocks -Hypothermia protocol performed -Cardiology following - cardiac cath 5/25 w/o signif CAD appreciated -s/p CRT-D 5/25 per EP  LBBB  Chronic systolic CHF  -EF [id number] per TTE April 2017 - EF 30-35% w/ diffuse hypokinesis per TEE 02/08/16 - diuresis resumed per Nephrology Filed Weights   02/17/16 0408 02/18/16 0500 02/19/16 0637  Weight: 113.1 kg (249 lb 5.4 oz) 109 kg (240 lb 4.8 oz) 107.8 kg (237 lb 10.5 oz)     Aortic aneurysm / aortic root enlargement  -5.2 cm aortic root dilation per TEE 02/08/16 - care per Cardiology   Aortic valve regurgitation - moderate Cards following - to consider CT in 6 months in f/u - noted on TEE 02/08/16  Acute blood loss anemia / Bilateral Retroperitoneal Bleeds Hgb stable - follow   Recent Labs Lab 02/13/16 1200 02/14/16 0310 02/15/16 0222 02/16/16 0532 02/17/16 0337 02/17/16 1903  HGB 8.8* 8.7* 9.0* 9.2* 9.2* 10.0*     Bilateral LE weakness and numbness -believed to be due to bilateral retroperitoneal bleeds w/  compression of B femoral nerves - continues to slowly improve - will need CIR stay   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 Baseline Cr~1.9 - Nephrology following - crt appears to be back to his baseline  Recent Labs Lab 02/15/16 0222 02/16/16 0532 02/17/16 0337 02/17/16 1903 02/18/16 0234 02/19/16 0412  CREATININE 1.05 1.60* 1.55* 1.49* 1.53* 1.50*     HTN  -hypotension improved w/ lower dose of coreg   Paroxysmal A.fib  -NSR at present - follow on tele   CITROBACTER KOSERI Aspiration Pneumonia  -Completed 7 day course antibiotics - no clinical sx to suggest ongoing infection at this time   Acute encephalopathy  -Resolved   Hyperkalemia -resolved  Rhabdomyolysis - resolved -Atorvastatin stopped 5/17 -CK peaked at 7202 > has now normalized   Hypothyroidism -Synthroid 75 g daily - TSH normal   DM Pt has been on diabetic diet previously, but never required meds - A1c 7.1 - cont SSI - CBG controlled - doubt medical tx will be required    DVT prophylaxis: SQ heparin  Code Status: FULL CODE Family Communication: no family present at time of exam  Disposition Plan: hopeful for CIR Monday  Consultants:  [facility] Cardiology  Nephrology  Neurology PCCM   Significant Events: 05/08 - cardiac arrest. Started on TTM - 33 degrees C CT head 05/08 > no acute process Echo 5/9 > EF 30 to 35%, severe aortic regurgitation EEG 5/9 > no focal, hemispheric, or lateralizing features; demonstrated a burst/suppression pattern, which can be  consistent with a sedated EEG 5/11>>mod diffuse slowing, No epileptiform activity was recorded. 5/10 pt rewarmed per hypothermia protocol 5/14 > Extubated 5/15 Hip spine films > no acute abnormality 5/16 TEE - EF= 30%- 35%. Diffuse hypokinesis - Aortic valve: Moderate regurgitation.- Left atrium: Moderately dilated.  5/19 MRI L-spine - Bilateral retroperitoneal hemorrhage involving the psoas musculature. Follow-up CT Abdomen and Pelvis could  characterize further 5/25 - cardiac cath - CRT-D implantation  5/26 transfer to tele bed  Antimicrobials:  Zosyn 5/11> 5/16 Vancomycin 5/11> 5/14 Ceftriaxone 5/16 > 5/18  Subjective: No new complaints.  Motivated to work with therapy.  Denies chest pain shortness of breath fevers chills nausea or vomiting.  Objective: Blood pressure 138/83, pulse 97, temperature 98.6 F (37 C), temperature source Oral, resp. rate 18, height 5\' 9"  (1.753 m), weight 107.8 kg (237 lb 10.5 oz), SpO2 100 %.  Intake/Output Summary (Last 24 hours) at 02/19/16 1105 Last data filed at 02/19/16 1009  Gross per 24 hour  Intake    240 ml  Output    625 ml  Net   -385 ml   Filed Weights   02/17/16 0408 02/18/16 0500 02/19/16 0637  Weight: 113.1 kg (249 lb 5.4 oz) 109 kg (240 lb 4.8 oz) 107.8 kg (237 lb 10.5 oz)    Examination: General: No acute respiratory distress - alert and conversant  Lungs: Clear to auscultation bilaterally  Cardiovascular: Regular rate and rhythm without murmur  Abdomen: Nontender, nondistended, soft, bowel sounds positive, no rebound Extremities: No significant cyanosis, or clubbing - 1+ pedal edema B LE persists   CBC:  Recent Labs Lab 02/13/16 0322  02/14/16 0310 02/15/16 0222 02/16/16 0532 02/17/16 0337 02/17/16 1903  WBC 22.1*  --  20.4* 17.3* 15.4* 14.3* 9.8  NEUTROABS 17.3*  --  15.7*  --   --   --   --   HGB 8.6*  < > 8.7* 9.0* 9.2* 9.2* 10.0*  HCT 27.4*  < > 27.4* 28.4* 28.9* 29.0* 31.6*  MCV 94.2  --  95.5 93.7 94.4 94.5 93.8  PLT 262  --  229 213 222 218 233  < > = values in this interval not displayed. Basic Metabolic Panel:  Recent Labs Lab 02/14/16 0310 02/15/16 0222 02/16/16 0532 02/17/16 [id number] 02/17/16 1903 02/18/16 0234 02/19/16 0412  NA 143 137 135 133*  --  133* 134*  K 4.1 4.1 4.2 4.3  --  4.4 4.3  CL 112* 108 108 107  --  108 106  CO2 26 22 20* 21*  --  21* 19*  GLUCOSE 141* 155* 130* 118*  --  114* 122*  BUN 51* 42* 38* 32*  --  28*  30*  CREATININE 1.83* 1.05 1.60* 1.55* 1.49* 1.53* 1.50*  CALCIUM 8.3* 8.3* 8.4* 8.4*  --  8.2* 8.3*  MG 2.7* 2.3 2.2 2.1  --  2.0  --   PHOS 2.3*  --   --  2.7  --  3.2  --    GFR: Estimated Creatinine Clearance: 60.2 mL/min (by C-G formula based on Cr of 1.5).  Liver Function Tests:  Recent Labs Lab 02/17/16 0337 02/18/16 0234 02/19/16 0412  AST 64*  --  52*  ALT 41  --  21  ALKPHOS 60  --  63  BILITOT 1.6*  --  1.0  PROT 5.9*  --  5.9*  ALBUMIN 2.2*  2.3* 2.3* 2.1*    Cardiac Enzymes:  Recent Labs Lab 02/12/16 2228 02/13/16 0322 02/14/16  0310 02/15/16 0222 02/16/16 0532  CKTOTAL 3419* 3442* 2121* 1474* 776*    HbA1C: HGB A1C MFR BLD  Date/Time Value Ref Range Status  02/10/2016 03:47 PM 7.1* 4.8 - 5.6 % Final    Comment:    (NOTE)         Pre-diabetes: 5.7 - 6.4         Diabetes: >6.4         Glycemic control for adults with diabetes: <7.0   02/14/2012 06:20 AM 6.1* <5.7 % Final    Comment:    (NOTE)                                                                       According to the ADA Clinical Practice Recommendations for 2011, when HbA1c is used as a screening test:  >=6.5%   Diagnostic of Diabetes Mellitus           (if abnormal result is confirmed) 5.7-6.4%   Increased risk of developing Diabetes Mellitus References:Diagnosis and Classification of Diabetes Mellitus,Diabetes [id number] 1):S62-S69 and Standards of Medical Care in         Diabetes - 2011,Diabetes P3829181 (Suppl 1):S11-S61.    CBG:  Recent Labs Lab 02/18/16 0829 02/18/16 1147 02/18/16 1647 02/18/16 2202 02/19/16 0607  GLUCAP 124* 198* 126* 99 135*    Recent Results (from the past 240 hour(s))  Culture, blood (Routine X 2) w Reflex to ID Panel     Status: None   Collection Time: 02/10/16  8:40 AM  Result Value Ref Range Status   Specimen Description BLOOD RIGHT HAND  Final   Special Requests BOTTLES DRAWN AEROBIC ONLY 4CC  Final   Culture NO GROWTH 5  DAYS  Final   Report Status 02/15/2016 FINAL  Final  Culture, blood (Routine X 2) w Reflex to ID Panel     Status: None   Collection Time: 02/10/16  9:00 AM  Result Value Ref Range Status   Specimen Description BLOOD LEFT HAND  Final   Special Requests IN PEDIATRIC BOTTLE 1.5CC  Final   Culture NO GROWTH 5 DAYS  Final   Report Status 02/15/2016 FINAL  Final  Culture, Urine     Status: Abnormal   Collection Time: 02/10/16  6:02 PM  Result Value Ref Range Status   Specimen Description URINE, CLEAN CATCH  Final   Special Requests NONE  Final   Culture 1,000 COLONIES/mL INSIGNIFICANT GROWTH (A)  Final   Report Status 02/12/2016 FINAL  Final  Surgical pcr screen     Status: Abnormal   Collection Time: 02/17/16  5:30 AM  Result Value Ref Range Status   MRSA, PCR NEGATIVE NEGATIVE Final   Staphylococcus aureus POSITIVE (A) NEGATIVE Final    Comment:        The Xpert SA Assay (FDA approved for NASAL specimens in patients over 48 years of age), is one component of a comprehensive surveillance program.  Test performance has been validated by [facility] for patients greater than or equal to 15 year old. It is not intended to diagnose infection nor to guide or monitor treatment.      Scheduled Meds: . amiodarone  400 mg Oral Daily  . carvedilol  25 mg Oral  BID WC  . docusate sodium  100 mg Oral BID  . furosemide  40 mg Oral Daily  . heparin subcutaneous  5,000 Units Subcutaneous Q8H  . insulin aspart  0-5 Units Subcutaneous QHS  . insulin aspart  0-9 Units Subcutaneous TID WC  . isosorbide-hydrALAZINE  0.5 tablet Oral TID  . levothyroxine  75 mcg Oral QAC breakfast  . polyethylene glycol  17 g Oral Daily   Continuous Infusions: . sodium chloride 10 mL/hr at 02/11/16 1757     LOS: 18 days   Time spent: 25 minutes   Cherene Altes, MD [facility] Office  [phone] Pager - Text Page per Shea Evans as per below:  On-Call/Text Page:      [email or web address]      password  [email or web address]  If 7PM-7AM, please contact night-coverage [email or web address] Password [email or web address] 02/19/2016, 11:05 AM

## 2016-02-19 NOTE — Progress Notes (Signed)
Patient ID: Eric [name], male   DOB: [birth year], 65 y.o.   MRN: [id number]  [facility] Progress Note   Assessment/ Plan:   1. AKI on chronic kidney disease stage IV (baseline creatinine 1.8-2.0): Nonoliguric. Renal function stable at/around his baseline with fair urine output. No evidence of contrast-induced nephropathy status post coronary angiogram 2 days ago with ICD implantation. He has development of trace to 1+ right lower extremity edema -will restart his furosemide 40 mg daily that was initially held because of acute on chronic renal failure and the need for volume expansion prior to contrast exposure. 2. Rhabdomyolysis: CPK levels improved- 700 down from 7000. Back pain better. Ongoing physical therapy with possible plans for continued outpatient PT. 3. Cardiac arrest: Presumed V. tach/V. fib with V. fib seen on AED interrogation. Underwent ICD placement 2 days ago. 4. Chronic systolic heart failure: EF of 30-35%. Furosemide to be restarted today-40 mg daily. On BiDil/Coreg 5. Hypertension: Blood pressure intermittently elevated-restart diuretic.  Subjective:   Reports some transient hypotension yesterday morning with attempts at ambulation. Denies any chest pain or shortness of breath and feels like he has swelling of his right leg.    Objective:   BP 138/83 mmHg  Pulse 97  Temp(Src) 98.6 F (37 C) (Oral)  Resp 18  Ht 5\' 9"  (1.753 m)  Wt 107.8 kg (237 lb 10.5 oz)  BMI 35.08 kg/m2  SpO2 100%  Intake/Output Summary (Last 24 hours) at 02/19/16 [id number] Last data filed at 02/19/16 0100  Gross per 24 hour  Intake    250 ml  Output    425 ml  Net   -175 ml   Weight change: -1.2 kg (-2 lb 10.3 oz)  Physical Exam: [id number] resting in bed eating breakfast  CVS: Pulse regular rhythm, normal rate, S1 and S2 normal. Resp: Clear to auscultation bilaterally-no rales/rhonchi Abd: Soft, flat, nontender Ext: Trace left lower extremity edema, trace to 1+ right  lower extremity edema  Imaging: Dg Chest 2 View  02/18/2016  CLINICAL DATA:  Post ICD insertion EXAM: CHEST  2 VIEW COMPARISON:  02/10/2016 FINDINGS: Borderline cardiomegaly. No infiltrate or pulmonary edema. There is 3 leads cardiac pacemaker with left subclavian approach with leads in right atrium right ventricle and left coronary sinus. No pneumothorax. Mild degenerative changes thoracic spine. IMPRESSION: Three leads cardiac pacemaker in place.  No pneumothorax. Electronically Signed   By: Lahoma Crocker M.D.   On: 02/18/2016 09:35    Labs: BMET  Recent Labs Lab 02/13/16 [id number] 02/14/16 0310 02/15/16 0222 02/16/16 0532 02/17/16 [id number] 02/17/16 1903 02/18/16 0234 02/19/16 0412  NA 143 143 137 135 133*  --  133* 134*  K 4.0 4.1 4.1 4.2 4.3  --  4.4 4.3  CL 107 112* 108 108 107  --  108 106  CO2 27 26 22  20* 21*  --  21* 19*  GLUCOSE 179* 141* 155* 130* 118*  --  114* 122*  BUN 72* 51* 42* 38* 32*  --  28* 30*  CREATININE 2.22* 1.83* 1.05 1.60* 1.55* 1.49* 1.53* 1.50*  CALCIUM 8.3* 8.3* 8.3* 8.4* 8.4*  --  8.2* 8.3*  PHOS  --  2.3*  --   --  2.7  --  3.2  --    CBC  Recent Labs Lab 02/13/16 0322  02/14/16 0310 02/15/16 0222 02/16/16 0532 02/17/16 0337 02/17/16 1903  WBC 22.1*  --  20.4* 17.3* 15.4* 14.3* 9.8  NEUTROABS 17.3*  --  15.7*  --   --   --   --  HGB 8.6*  < > 8.7* 9.0* 9.2* 9.2* 10.0*  HCT 27.4*  < > 27.4* 28.4* 28.9* 29.0* 31.6*  MCV 94.2  --  95.5 93.7 94.4 94.5 93.8  PLT 262  --  229 213 222 218 233  < > = values in this interval not displayed.  Medications:    . amiodarone  400 mg Oral Daily  . carvedilol  25 mg Oral BID WC  . docusate sodium  100 mg Oral BID  . heparin subcutaneous  5,000 Units Subcutaneous Q8H  . insulin aspart  0-5 Units Subcutaneous QHS  . insulin aspart  0-9 Units Subcutaneous TID WC  . isosorbide-hydrALAZINE  0.5 tablet Oral TID  . levothyroxine  75 mcg Oral QAC breakfast  . polyethylene glycol  17 g Oral Daily   Elmarie Shiley,  MD 02/19/2016, 9:04 AM

## 2016-02-20 DIAGNOSIS — J9601 Acute respiratory failure with hypoxia: Secondary | ICD-10-CM | POA: Diagnosis not present

## 2016-02-20 DIAGNOSIS — M545 Low back pain: Secondary | ICD-10-CM | POA: Diagnosis not present

## 2016-02-20 DIAGNOSIS — I469 Cardiac arrest, cause unspecified: Secondary | ICD-10-CM | POA: Diagnosis not present

## 2016-02-20 LAB — GLUCOSE, CAPILLARY
Glucose-Capillary: 119 mg/dL — ABNORMAL HIGH (ref 65–99)
Glucose-Capillary: 145 mg/dL — ABNORMAL HIGH (ref 65–99)
Glucose-Capillary: 132 mg/dL — ABNORMAL HIGH (ref 65–99)
Glucose-Capillary: 148 mg/dL — ABNORMAL HIGH (ref 65–99)

## 2016-02-20 LAB — BASIC METABOLIC PANEL
ANION GAP: 7 (ref 5–15)
BUN: 26 mg/dL — AB (ref 6–20)
CALCIUM: 8.4 mg/dL — AB (ref 8.9–10.3)
CO2: 20 mmol/L — ABNORMAL LOW (ref 22–32)
Chloride: 106 mmol/L (ref 101–111)
Creatinine, Ser: 1.58 mg/dL — ABNORMAL HIGH (ref 0.61–1.24)
GFR calc Af Amer: 52 mL/min — ABNORMAL LOW (ref >60)
GFR, EST NON AFRICAN AMERICAN: 45 mL/min — AB (ref >60)
Glucose, Bld: 111 mg/dL — ABNORMAL HIGH (ref 65–99)
POTASSIUM: 4.2 mmol/L (ref 3.5–5.1)
SODIUM: 133 mmol/L — AB (ref 135–145)

## 2016-02-20 LAB — CK: CK TOTAL: 268 U/L (ref 49–397)

## 2016-02-20 NOTE — Progress Notes (Signed)
Patient ID: Eric [name], male   DOB: 1951-05-10, 65 y.o.   MRN: [id number]  BP 140/75 mmHg  Pulse 86  Temp(Src) 98.2 F (36.8 C) (Oral)  Resp 20  Ht 5\' 9"  (1.753 m)  Wt 109.09 kg (240 lb 8 oz)  BMI 35.50 kg/m2  SpO2 99%  Labs/urine output and overnight events noted. Renal function stable after restarting furosemide with fair urine output. Will sign off at this time and remain available for questions. Please instruct patient to follow-up with Dr. Florene Glen as previously scheduled.  Elmarie Shiley MD [facility]. Office # [phone] Pager # [phone] 9:24 AM

## 2016-02-20 NOTE — Progress Notes (Signed)
Eric [name]  [id number] DOB: Jan 31, 1951 DOA: 01/31/2016 PCP: [name], MD    Brief Narrative:  65 yo M Hx Chronic Systolic CHF, A-fib on Eliquis, HTN, Heart murmur, CKD, Hypothyroidism, and Chronic Pain Syndrome (low back) who was in his USOH until the evening of 05/08 when he began to breath very heavily while driving, before becoming unresponsive. His wife immediately got the car stopped, got him out of the car and began CPR while calling EMS.  EMS arrived within 5 - 10 minutes and upon their arrival, he received 2 defibrillations via AED. ROSC was achieved within roughly 15 - 20 minutes.  Upon ED arrival, he remained unresponsive and was subsequently intubated. PCCM was called for admission and consideration of hypothermia protocol. Cardiology was also called in consultation.  Assessment & Plan:  Cardiac arrest - VT/Vfib - CPR performed by wife and EMS - multiple shocks - Hypothermia protocol performed - Cardiology following - cardiac cath 5/25 w/o signif CAD appreciated - s/p CRT-D 5/25 per EP - pt clinically stable this AM    LBBB  Chronic systolic CHF  - EF [id number] per TTE April 2017 - EF 30-35% w/ diffuse hypokinesis per TEE 02/08/16  - diuresis resumed per Nephrology Filed Weights   02/18/16 0500 02/19/16 0637 02/20/16 0500  Weight: 109 kg (240 lb 4.8 oz) 107.8 kg (237 lb 10.5 oz) 109.09 kg (240 lb 8 oz)   Aortic aneurysm / aortic root enlargement  - 5.2 cm aortic root dilation per TEE 02/08/16  - management per cardiology   Aortic valve regurgitation - moderate - Cards following - to consider CT in 6 months in f/u  - on TEE 02/08/16  Acute blood loss anemia / Bilateral Retroperitoneal Bleeds - no signs of bleeding  Bilateral LE weakness and numbness - believed to be due to bilateral retroperitoneal bleeds w/ compression of B femoral nerves  - continues to slowly improve  - agreeable to IR  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 - Baseline Cr~1.9 -  Nephrology following  - Cr appears to be back to his baseline   HTN, essential  - reasonably stable   Paroxysmal A.fib  - NSR at present   CITROBACTER KOSERI Aspiration Pneumonia  - Completed 7 day course antibiotics - resolved   Acute encephalopathy  - Resolved   Hyperkalemia -resolved  Rhabdomyolysis  - resolved - Atorvastatin stopped 5/17  Hypothyroidism - Synthroid 75 g daily  - TSH normal   DM with complications of CKD - 123456 7.1 - cont SSI - CBG controlled   DVT prophylaxis: SQ heparin  Code Status: FULL CODE Family Communication: no family present at time of exam  Disposition Plan: hopeful for CIR Monday  Consultants:  [facility] Cardiology  Nephrology  Neurology PCCM   Significant Events: 05/08 - cardiac arrest. Started on TTM - 33 degrees C CT head 05/08 > no acute process Echo 5/9 > EF 30 to 35%, severe aortic regurgitation EEG 5/9 > no focal, hemispheric, or lateralizing features; demonstrated a burst/suppression pattern, which can be consistent with a sedated EEG 5/11>>mod diffuse slowing, No epileptiform activity was recorded. 5/10 pt rewarmed per hypothermia protocol 5/14 > Extubated 5/15 Hip spine films > no acute abnormality 5/16 TEE - EF= 30%- 35%. Diffuse hypokinesis - Aortic valve: Moderate regurgitation.- Left atrium: Moderately dilated.  5/19 MRI L-spine - Bilateral retroperitoneal hemorrhage involving the psoas musculature. Follow-up CT Abdomen and Pelvis could characterize further 5/25 - cardiac cath - CRT-D implantation  5/26 transfer to tele bed  Antimicrobials:  Zosyn 5/11> 5/16 Vancomycin 5/11> 5/14 Ceftriaxone 5/16 > 5/18  Subjective: No new complaints.  Motivated to work with therapy.    Objective: Blood pressure 140/75, pulse 86, temperature 98.2 F (36.8 C), temperature source Oral, resp. rate 20, height 5\' 9"  (1.753 m), weight 109.09 kg (240 lb 8 oz), SpO2 99 %.  Intake/Output Summary (Last 24 hours) at 02/20/16 1311 Last  data filed at 02/20/16 1100  Gross per 24 hour  Intake    240 ml  Output   1700 ml  Net  -1460 ml   Filed Weights   02/18/16 0500 02/19/16 0637 02/20/16 0500  Weight: 109 kg (240 lb 4.8 oz) 107.8 kg (237 lb 10.5 oz) 109.09 kg (240 lb 8 oz)    Examination: General: No acute respiratory distress - alert and conversant  Lungs: Clear to auscultation bilaterally  Cardiovascular: Regular rate and rhythm without murmur  Abdomen: Nontender, nondistended, soft, bowel sounds positive, no rebound Extremities: No significant cyanosis, or clubbing - 1+ pedal edema B LE persists   CBC:  Recent Labs Lab 02/14/16 0310 02/15/16 0222 02/16/16 0532 02/17/16 0337 02/17/16 1903  WBC 20.4* 17.3* 15.4* 14.3* 9.8  NEUTROABS 15.7*  --   --   --   --   HGB 8.7* 9.0* 9.2* 9.2* 10.0*  HCT 27.4* 28.4* 28.9* 29.0* 31.6*  MCV 95.5 93.7 94.4 94.5 93.8  PLT 229 213 222 218 0000000   Basic Metabolic Panel:  Recent Labs Lab 02/14/16 0310 02/15/16 0222 02/16/16 0532 02/17/16 0337 02/17/16 1903 02/18/16 0234 02/19/16 0412 02/20/16 0232  NA 143 137 135 133*  --  133* 134* 133*  K 4.1 4.1 4.2 4.3  --  4.4 4.3 4.2  CL 112* 108 108 107  --  108 106 106  CO2 26 22 20* 21*  --  21* 19* 20*  GLUCOSE 141* 155* 130* 118*  --  114* 122* 111*  BUN 51* 42* 38* 32*  --  28* 30* 26*  CREATININE 1.83* 1.05 1.60* 1.55* 1.49* 1.53* 1.50* 1.58*  CALCIUM 8.3* 8.3* 8.4* 8.4*  --  8.2* 8.3* 8.4*  MG 2.7* 2.3 2.2 2.1  --  2.0  --   --   PHOS 2.3*  --   --  2.7  --  3.2  --   --    GFR: Estimated Creatinine Clearance: 57.5 mL/min (by C-G formula based on Cr of 1.58).  Liver Function Tests:  Recent Labs Lab 02/17/16 0337 02/18/16 0234 02/19/16 0412  AST 64*  --  52*  ALT 41  --  21  ALKPHOS 60  --  63  BILITOT 1.6*  --  1.0  PROT 5.9*  --  5.9*  ALBUMIN 2.2*  2.3* 2.3* 2.1*    Cardiac Enzymes:  Recent Labs Lab 02/14/16 0310 02/15/16 0222 02/16/16 0532 02/20/16 0232  CKTOTAL 2121* 1474* 776* 268     HbA1C: HGB A1C MFR BLD  Date/Time Value Ref Range Status  02/10/2016 03:47 PM 7.1* 4.8 - 5.6 % Final    Comment:    (NOTE)         Pre-diabetes: 5.7 - 6.4         Diabetes: >6.4         Glycemic control for adults with diabetes: <7.0   02/14/2012 06:20 AM 6.1* <5.7 % Final    Comment:    (NOTE)  According to the ADA Clinical Practice Recommendations for 2011, when HbA1c is used as a screening test:  >=6.5%   Diagnostic of Diabetes Mellitus           (if abnormal result is confirmed) 5.7-6.4%   Increased risk of developing Diabetes Mellitus References:Diagnosis and Classification of Diabetes Mellitus,Diabetes [id number] 1):S62-S69 and Standards of Medical Care in         Diabetes - 2011,Diabetes [id number] (Suppl 1):S11-S61.    CBG:  Recent Labs Lab 02/19/16 1103 02/19/16 1744 02/19/16 2022 02/20/16 0606 02/20/16 1119  GLUCAP 131* 107* 155* 119* 145*    Recent Results (from the past 240 hour(s))  Culture, Urine     Status: Abnormal   Collection Time: 02/10/16  6:02 PM  Result Value Ref Range Status   Specimen Description URINE, CLEAN CATCH  Final   Special Requests NONE  Final   Culture 1,000 COLONIES/mL INSIGNIFICANT GROWTH (A)  Final   Report Status 02/12/2016 FINAL  Final  Surgical pcr screen     Status: Abnormal   Collection Time: 02/17/16  5:30 AM  Result Value Ref Range Status   MRSA, PCR NEGATIVE NEGATIVE Final   Staphylococcus aureus POSITIVE (A) NEGATIVE Final    Comment:        The Xpert SA Assay (FDA approved for NASAL specimens in patients over 60 years of age), is one component of a comprehensive surveillance program.  Test performance has been validated by [facility] for patients greater than or equal to 54 year old. It is not intended to diagnose infection nor to guide or monitor treatment.      Scheduled Meds: . amiodarone  400 mg Oral Daily  .  carvedilol  25 mg Oral BID WC  . docusate sodium  100 mg Oral BID  . furosemide  40 mg Oral Daily  . heparin subcutaneous  5,000 Units Subcutaneous Q8H  . insulin aspart  0-5 Units Subcutaneous QHS  . insulin aspart  0-9 Units Subcutaneous TID WC  . isosorbide-hydrALAZINE  0.5 tablet Oral TID  . levothyroxine  75 mcg Oral QAC breakfast  . polyethylene glycol  17 g Oral Daily   Continuous Infusions: . sodium chloride 10 mL/hr at 02/11/16 1757     LOS: 19 days   Time spent: 25 minutes   Mart Piggs, MD [facility] Office  [phone]  If 7PM-7AM, please contact night-coverage [email or web address] Password [email or web address] 02/20/2016, 1:11 PM

## 2016-02-21 DIAGNOSIS — M545 Low back pain: Secondary | ICD-10-CM | POA: Diagnosis not present

## 2016-02-21 DIAGNOSIS — I469 Cardiac arrest, cause unspecified: Secondary | ICD-10-CM | POA: Diagnosis not present

## 2016-02-21 LAB — RENAL FUNCTION PANEL
Albumin: 2.3 g/dL — ABNORMAL LOW (ref 3.5–5.0)
Anion gap: 5 (ref 5–15)
BUN: 28 mg/dL — AB (ref 6–20)
CHLORIDE: 107 mmol/L (ref 101–111)
CO2: 20 mmol/L — AB (ref 22–32)
CREATININE: 1.55 mg/dL — AB (ref 0.61–1.24)
Calcium: 8.3 mg/dL — ABNORMAL LOW (ref 8.9–10.3)
GFR calc Af Amer: 53 mL/min — ABNORMAL LOW (ref >60)
GFR, EST NON AFRICAN AMERICAN: 46 mL/min — AB (ref >60)
Glucose, Bld: 105 mg/dL — ABNORMAL HIGH (ref 65–99)
Phosphorus: 2.7 mg/dL (ref 2.5–4.6)
Potassium: 4.3 mmol/L (ref 3.5–5.1)
Sodium: 132 mmol/L — ABNORMAL LOW (ref 135–145)

## 2016-02-21 LAB — GLUCOSE, CAPILLARY
GLUCOSE-CAPILLARY: 109 mg/dL — AB (ref 65–99)
Glucose-Capillary: 139 mg/dL — ABNORMAL HIGH (ref 65–99)
Glucose-Capillary: 100 mg/dL — ABNORMAL HIGH (ref 65–99)
Glucose-Capillary: 123 mg/dL — ABNORMAL HIGH (ref 65–99)

## 2016-02-21 LAB — CBC
HCT: 27.4 % — ABNORMAL LOW (ref 39.0–52.0)
Hemoglobin: 8.7 g/dL — ABNORMAL LOW (ref 13.0–17.0)
MCH: 29.3 pg (ref 26.0–34.0)
MCHC: 31.8 g/dL (ref 30.0–36.0)
MCV: 92.3 fL (ref 78.0–100.0)
PLATELETS: 305 10*3/uL (ref 150–400)
RBC: 2.97 MIL/uL — ABNORMAL LOW (ref 4.22–5.81)
RDW: 15 % (ref 11.5–15.5)
WBC: 9.3 10*3/uL (ref 4.0–10.5)

## 2016-02-21 NOTE — Progress Notes (Signed)
Inpatient Rehabilitation  I observed pt's PT session this am.  He was able to attempt to stand from recliner chair with +2 assist following Clarise Cruz Plus transfer to recliner. I have informed pt. and wife that  I will initiate the  insurance authorization process tomorrow am, as [facility] is closed today due to the  [facility] Day holiday.  I would not expect to receive a decision until late Tuesday at the earliest.  I have requested an updated OT note for [employer].  I recommend a back up plan in the event pt. cannot come to IP Rehab for any reason.  Please call if questions.  [facility] Admissions Coordinator Cell [phone] Office [phone]

## 2016-02-21 NOTE — PMR Pre-admission (Signed)
PMR Admission Coordinator Pre-Admission Assessment  Patient: Declin Rajan is an 65 y.o., male MRN: [id number] DOB: March 23, 1951 Height: _0  (175.3 cm) Weight: 107.004 kg (235 lb 14.4 oz)              Insurance Information HMO:     PPO:      PCP:      IPA:      80/20:      OTHER: [employer] PPO PRIMARY:  [place]      Policy#:  [id number]      Subscriber:  self CM Name:  Philis Fendt     Phone#: [phone] ext [phone]     Fax#:  [phone] Pre-Cert#:  [id number], approved  02/22/16 for 14 days with clinical updates due 03/06/16      Employer:  [employer] Benefits:  Phone #: [phone]     Name:  Jiles Garter. Date:  09/26/15     Deduct:  $1250      Out of Pocket Max:  $4350      Life Max:  none CIR:  $450 copay then 80%/20% until OOP max met      SNF:  80%/20% Outpatient:  $52 copay per visit     Co-Pay:  Home Health:  80%/20%      Co-Pay:   DME:  80%     Co-Pay:  20% Providers:  In network SECONDARY:       Policy#:       Subscriber:  CM Name:       Phone#:      Fax#:  Pre-Cert#:       Employer:  Benefits:  Phone #:      Name:  Eff. 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 right handed male with history of hypertension, chronic renal insufficiency baseline creatinine 9.9-3.5, systolic congestive heart failure, atrial fibrillation on eliquis. Patient lives with wife. Independent prior to admission. 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 Presented from outside hospital 01/31/2016 with cardiac arrest while at [facility]. Wife initiated CPR calling EMS. EMS arrived within 5-10 minutes received 2 defibrillations. Troponin 0. 11. Lactic acid 1.2. Patient was intubated for airway protection and extubated 02/06/2016. Echocardiogram ejection fraction 35% diffuse hypokinesis with grade 2 diastolic dysfunction. Initially placed on intravenous amiodarone discontinued due to bradycardia and later resumed. TEE completed with moderate to severe global reduction in LV function. No evidence of vegetation. Systolic function moderately to severely reduced. Renal services Was consulted for creatinine 2.87-3.15 from baseline 1.8. Renal ultrasound without obstruction.Renal function has steadily improved to creatinine 1.53. Underwent cardiac catheterization after renal function stabilized 02/17/2016 s/p CRT-D per Dr. Osie Cheeks cardiology services. Presently on subcutaneous heparin  for DVT prophylaxis. Diet has been advanced to a regular consistency. Acute on chronic anemia 9.4 and monitored. Physical therapy evaluation has been completed with recommendations of physical medicine rehabilitation consult. Patient was admitted for comprehensive rehabilitation program      Past Medical History  Past Medical History  Diagnosis Date  . Claustrophobia   . Heart murmur   . Hypertension   . Varicose vein of leg     right  . Dysrhythmia     "palpitations"  . Migraine 02/13/12    "opthalmic"  . Chronic lower back pain   . Exertional dyspnea 01/2012  . CHF (congestive heart failure) ([place])   . Hypothyroidism   . Chronic kidney disease     kidney fx studies increased     Family History  family history includes Diabetes in his mother; Heart disease in his father and mother; Heart failure in his father, mother, and mother; Hypertension in his father and mother.  Prior Rehab/Hospitalizations:   Has the patient had major surgery during 100 days prior to admission? No  Current Medications   Current facility-administered medications:  .  0.9 %  sodium chloride infusion, , Intravenous, Continuous, Collene Gobble, MD, Last Rate: 10 mL/hr at 02/11/16 1757 .  acetaminophen (TYLENOL) tablet 325-650 mg, 325-650 mg, Oral, Q4H PRN, Evans Lance, MD, 650 mg at 02/21/16 0517 .  amiodarone (PACERONE) tablet 400 mg, 400 mg, Oral, Daily, Peter M Martinique, MD, 400 mg at 02/21/16 1000 .  bisacodyl (DULCOLAX) EC tablet 5 mg, 5 mg, Oral, Daily PRN, Marijean Heath, NP, 5 mg at 02/06/16 1506 .  bisacodyl (DULCOLAX) suppository 10 mg, 10 mg, Rectal, Daily PRN, Marijean Heath, NP .  carvedilol (COREG) tablet 25 mg, 25 mg, Oral, BID WC, Cherene Altes, MD, 25 mg at 02/21/16 0923 .  docusate sodium (COLACE) capsule 100 mg, 100 mg, Oral, BID, Marijean Heath, NP, 100 mg at 02/21/16 1000 .  furosemide (LASIX) tablet 40 mg, 40 mg, Oral, Daily, Elmarie Shiley, MD, 40 mg at 02/21/16 1000 .  heparin injection 5,000 Units, 5,000 Units, Subcutaneous, Q8H, Evans Lance, MD, 5,000 Units at 02/21/16 1420 .  insulin aspart (novoLOG) injection 0-5 Units, 0-5 Units, Subcutaneous, QHS, Dianne Dun, NP, 2 Units at 02/13/16 2210 .  insulin aspart (novoLOG) injection 0-9 Units, 0-9 Units, Subcutaneous, TID WC, Dianne Dun, NP, 1 Units at 02/21/16 1213 .  isosorbide-hydrALAZINE (BIDIL) 20-37.5 MG per tablet 0.5 tablet, 0.5 tablet, Oral, TID, Mihai Croitoru, MD, 0.5 tablet at 02/21/16 1000 .  levothyroxine (SYNTHROID, LEVOTHROID) tablet 75 mcg, 75 mcg, Oral, QAC breakfast, Collene Gobble, MD, 75 mcg at 02/21/16 [phone] .  metoprolol (LOPRESSOR) injection 5 mg, 5 mg, Intravenous, Q5 min PRN, Norman Herrlich, MD .  morphine 2 MG/ML injection 1-2 mg, 1-2 mg, Intravenous, Q2H PRN, Marijean Heath, NP, 2 mg at 02/14/16 1645 .  ondansetron (ZOFRAN) injection 4 mg, 4 mg, Intravenous, Q6H PRN,  Evans Lance, MD .  oxyCODONE-acetaminophen (PERCOCET/ROXICET) 5-325 MG per tablet 1 tablet, 1 tablet, Oral, Q6H PRN, Donita Brooks, NP, 1 tablet at 02/21/16 1122 .  polyethylene glycol (MIRALAX / GLYCOLAX) packet 17 g, 17 g, Oral, Daily, Marijean Heath, NP, 17 g at 02/18/16 1051  Patients Current Diet: Diet heart healthy/carb modified Room service appropriate?: Yes; Fluid consistency:: Thin  Precautions / Restrictions Precautions Precautions: Fall, ICD/Pacemaker Precaution Comments: Therapist on 5/26 spoke with EP PA re: heavy use of LUE  due to LE weakness. She emphasized to avoid movement at shoulder (flexion, abdct, reaching behind/internal rotation/extension) or stretching of upper chest are primary concerns to avoid. Agreed he could weightbear through LUE with elbow near/at his side.  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 None   Prior Functional Level Prior Function Level of Independence: Independent Comments: 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 is his Environmental education officer   Self Care: Did the patient need help bathing, dressing, using the toilet or eating?  Independent  Indoor Mobility: Did the patient need assistance with walking from room to room (with or without device)? Independent  Stairs: Did the patient need assistance with internal or external stairs (with or without device)? Independent  Functional Cognition: Did the patient need help planning regular tasks such as shopping or remembering  to take medications? Independent  Current Functional Level Cognition  Overall Cognitive Status: Within Functional Limits for tasks assessed Current Attention Level: Sustained Orientation Level: Oriented X4 Following Commands: Follows one step commands inconsistently, Follows one step commands with increased time Safety/Judgement: Decreased awareness of safety, Decreased awareness of deficits General Comments: will benefit from further cognitive assessment     Extremity Assessment (includes Sensation/Coordination)  Upper Extremity Assessment: Generalized weakness, LUE deficits/detail LUE Deficits / Details: elbow distally.  Lt shoulder NT due to ICD/pacemaker precautions   Lower Extremity Assessment: Generalized weakness    ADLs  Overall ADL's : Needs assistance/impaired Eating/Feeding: Set up, Sitting Grooming: Wash/dry hands, Wash/dry face, Oral care, Brushing hair, Set up, Sitting Upper Body Bathing: Moderate assistance, Sitting Lower Body Bathing: Maximal assistance, Sit to/from stand Upper Body Dressing : Moderate assistance, Sitting Lower Body Dressing: Maximal assistance, Sitting/lateral leans Lower Body Dressing Details (indicate cue type and reason): Pt was able to don/doff socks while seated EOC today! Toilet Transfer: Stand-pivot, BSC, Maximal assistance Toilet Transfer Details (indicate cue type and reason): utilizing sara plus  Toileting- Clothing Manipulation and Hygiene: Total assistance, Sit to/from stand Functional mobility during ADLs: Maximal assistance General ADL Comments: Pt very motivated to improve    Mobility  Overal bed mobility: Needs Assistance, +2 for physical assistance Bed Mobility: Supine to Sit Rolling: Min assist Sidelying to sit: Mod assist, +2 for physical assistance Supine to sit: Mod assist, +2 for physical assistance, HOB elevated Sit to supine: Mod assist, +2 for physical assistance General bed mobility comments: unable to roll to Lt due  to new pacemaker; elevated HOB and assisted to turn to sit at EOB    Transfers  Overall transfer level: Needs assistance Equipment used: Ambulation equipment used, Rolling walker (2 wheeled) Transfer via Lift Equipment: Marketing executive Transfers: Sit to/from Stand, Technical brewer Transfers Sit to Stand: +2 physical assistance, Max assist Stand pivot transfers: Total assist, +2 safety/equipment (sara-plus) General transfer comment: Practiced sit to partial stand x 3 - requires max A to lift hips off chair     Ambulation / Gait / Stairs / Wheelchair Mobility       Posture / Balance Dynamic Sitting Balance Sitting balance - Comments: initially needing UE support  for balance, then able to sit without hands on bed Balance Overall balance assessment: Needs assistance Sitting-balance support: No upper extremity supported, Feet supported Sitting balance-Leahy Scale: Good Sitting balance - Comments: initially needing UE support for balance, then able to sit without hands on bed Standing balance support: Bilateral upper extremity supported Standing balance-Leahy Scale: Poor Standing balance comment: See transfer section    Special needs/care consideration BiPAP/CPAP   no CPM   no Continuous Drip IV  no Dialysis   no        Days Life Vest   no Oxygen  no Special Bed   no Trach Size   no Wound Vac (area)   no       Skin per nursing assessment, left flank blood blister and left upper abdominal abrasions   As well as left upper chest incision Bowel mgmt last BM 02/21/16 on bedpan, continent per pt.  Bladder mgmt:  Continent, using urinal Diabetic mgmt  Was diet controlled PTA, has been on insulin since admission     Previous Home Environment Living Arrangements: Spouse/significant other Available Help at Discharge: Other (Comment) (wife reports she will arrange full time (S) while she works) Type of Home: [place]: One level Home Access: Stairs to enter Entrance Stairs-Rails:  None Technical brewer of Steps: 3 Bathroom Accessibility: Yes [place]: No  Discharge Living Setting Plans for Discharge Living Setting: Patient's home Type of Home at Discharge: House Discharge Home Layout: One level Discharge Home Access: Stairs to enter Entrance Stairs-Rails: Right, Left (per pt. differs from PT eval) Entrance Stairs-Number of Steps: 3 Discharge Bathroom Shower/Tub: Tub/shower unit Discharge Bathroom Toilet: Standard Discharge Bathroom Accessibility: Yes How Accessible: Accessible via wheelchair Does the patient have any problems obtaining your medications?: No  Social/Family/Support Systems Patient Roles: Spouse, Parent (has an adult son in [place]) Anticipated Caregiver: wife Von Kehl is primary caregiver Anticipated Caregiver's Contact Information: [phone] cell Ability/Limitations of Caregiver: pt. reports his wife is an Therapist, sports and the Environmental education officer at his solo opthalamology practice Caregiver Availability: Evenings only Discharge Plan Discussed with Primary Caregiver: Yes Is Caregiver In Agreement with Plan?: Yes Does Caregiver/Family have Issues with Lodging/Transportation while Pt is in Rehab?: No   Goals/Additional Needs Patient/Family Goal for Rehab: supervision PT/OT; n/a SLP  (pt. cleared by ST 02/14/16); I discussed with [name] that these goals will likely be achieved at a wheelchair level.  He expressed understanding. Expected length of stay: 20-25 days Cultural Considerations: pt. reports he is [employer], does not eat pork but has been managing his own dietary restrictions when ordering his meals from dietary Dietary Needs: heart healthy, carb modified, thin liquids Equipment Needs: TBA Pt/Family Agrees to Admission and willing to participate: Yes Program Orientation Provided & Reviewed with Pt/Caregiver Including Roles  & Responsibilities: Yes   Decrease burden of Care through IP rehab admission:  n/a   Possible need for SNF placement upon discharge: not anticipated   Patient Condition: This patient's medical and functional status has changed since the consult dated: 02/10/16 in which the Rehabilitation Physician determined and documented that the patient's condition is appropriate for intensive rehabilitative care in an inpatient rehabilitation facility. See "History of Present Illness" (above) for medical update. Functional changes are: Pt. Is participating in transfers with use of "Clarise Cruz Plus" and is requiring +2 max assist for trials of sit to stand at chairside. Pt. Was able to don and doff socks with supervision while seated at edge of chair .  Patient's medical and functional status update has been discussed with the Rehabilitation physician and patient remains appropriate for inpatient rehabilitation. Will admit to inpatient rehab today.  Preadmission Screen Completed By:  Gerlean Ren, 02/21/2016 4:12 PM ______________________________________________________________________   Discussed status with Dr. Naaman Plummer on 02/22/16 at  1230  and received telephone approval for admission today.  Admission Coordinator:  Gerlean Ren, time 1230 Sudie Grumbling 02/22/16

## 2016-02-21 NOTE — Progress Notes (Signed)
Occupational Therapy Treatment Patient Details Name: Eric [name] MRN: [id number] DOB: Dec 05, 1950 Today's Date: 02/21/2016    History of present illness 65 y.o. M with known sCHF and A.fib, presented 05/08 after out of hospital cardiac arrest. ROSC within 15-20 minutes after defib x 2 (vtach to vfib suspected). ETT 5/8-5/14. Now with acute encephalopathy with concern for anoxia per MD notes. 5/17 noted incr bil leg weakness and back pain; 5/19 MRI Bilateral retroperitoneal hemorrhage involving the psoas muscles; 5/22 neuro consult and determined decompression of nerves not indicated; 5/25 cardiac cath and s/p BiV ICD implant   OT comments  Pt is making steady progress with OT.  Today, he was able to don/doff socks with supervision in sitting position (was total A for this on Friday).  He participated in [place], and able to move into partial standing with max A.   He is very motivated, and expect he will make excellent progress with CIR.   Follow Up Recommendations  CIR;Supervision/Assistance - 24 hour    Equipment Recommendations  3 in 1 bedside comode;Tub/shower bench    Recommendations for Other Services Rehab consult    Precautions / Restrictions Precautions Precautions: Fall;ICD/Pacemaker Precaution Comments: Therapist on 5/26 spoke with EP PA re: heavy use of LUE due to LE weakness. She emphasized to avoid movement at shoulder (flexion, abdct, reaching behind/internal rotation/extension) or stretching of upper chest are primary concerns to avoid. Agreed he could weightbear through LUE with elbow near/at his side.  Restrictions Weight Bearing Restrictions: No LUE Weight Bearing: Non weight bearing (ICD placed )       Mobility Bed Mobility                  Transfers Overall transfer level: Needs assistance Equipment used: Ambulation equipment used;Rolling walker (2 wheeled) Transfers: Sit to/from Stand;Stand Pivot Transfers Sit to Stand: +2 physical  assistance;Max assist Stand pivot transfers: Total assist;+2 safety/equipment (sara-plus)       General transfer comment: Practiced sit to partial stand x 3 - requires max A to lift hips off chair     Balance Overall balance assessment: Needs assistance Sitting-balance support: No upper extremity supported;Feet supported Sitting balance-Leahy Scale: Good       Standing balance-Leahy Scale: Poor Standing balance comment: See transfer section                   ADL Overall ADL's : Needs assistance/impaired                     Lower Body Dressing: Maximal assistance;Sitting/lateral leans Lower Body Dressing Details (indicate cue type and reason): Pt was able to don/doff socks while seated EOC today!               General ADL Comments: Pt very motivated to improve      Vision                     Perception     Praxis      Cognition   Behavior During Therapy: [facility] for tasks assessed/performed Overall Cognitive Status: Within Functional Limits for tasks assessed                       Extremity/Trunk Assessment               Exercises Other Exercises Other Exercises: Pt perform manually resisted shoulder flex/ext, elbow flex and ext with  Rt UE only (Lt UE  with pacer/ICD precautions) x 10 reps with moderate resistance provided    Shoulder Instructions       General Comments      Pertinent Vitals/ Pain       Pain Assessment: No/denies pain  Home Living                                          Prior Functioning/Environment              Frequency Min 3X/week     Progress Toward Goals  OT Goals(current goals can now be found in the care plan section)  Progress towards OT goals: Progressing toward goals  Acute Rehab OT Goals Patient Stated Goal: to get better  Plan Discharge plan remains appropriate    Co-evaluation                 End of Session     Activity Tolerance Patient  tolerated treatment well   Patient Left in chair;with call bell/phone within reach   Nurse Communication Mobility status        Time: [id number] OT Time Calculation (min): 20 min  Charges: OT General Charges $OT Visit: 1 Procedure OT Treatments $Therapeutic Activity: 8-22 mins  Christorpher Hisaw M 02/21/2016, 2:33 PM

## 2016-02-21 NOTE — Progress Notes (Signed)
Physical Therapy Treatment Patient Details Name: Eric [name] MRN: [id number] DOB: Aug 31, 1951 Today's Date: 02/21/2016    History of Present Illness 65 y.o. M with known sCHF and A.fib, presented 05/08 after out of hospital cardiac arrest. ROSC within 15-20 minutes after defib x 2 (vtach to vfib suspected). ETT 5/8-5/14. Now with acute encephalopathy with concern for anoxia per MD notes. 5/17 noted incr bil leg weakness and back pain; 5/19 MRI Bilateral retroperitoneal hemorrhage involving the psoas muscles; 5/22 neuro consult and determined decompression of nerves not indicated; 5/25 cardiac cath and s/p BiV ICD implant    PT Comments    Pt continues to make steady progress with PT. Seeing gains in the activation of LE musculature in standing. Expect pt will continue to make good progress and remains very motivated. May attempt sliding board transfer at some point to allow pt more independence.  Follow Up Recommendations  CIR;Supervision/Assistance - 24 hour     Equipment Recommendations  Other (comment) (TBA)    Recommendations for Other Services       Precautions / Restrictions Precautions Precautions: Fall;ICD/Pacemaker Precaution Comments: Therapist on 5/26 spoke with EP PA re: heavy use of LUE due to LE weakness. She emphasized to avoid movement at shoulder (flexion, abdct, reaching behind/internal rotation/extension) or stretching of upper chest are primary concerns to avoid. Agreed he could weightbear through LUE with elbow near/at his side.  Restrictions Weight Bearing Restrictions: No LUE Weight Bearing: Non weight bearing (ICD placed )    Mobility  Bed Mobility                  Transfers Overall transfer level: Needs assistance Equipment used: Ambulation equipment used;Rolling walker (2 wheeled) Transfers: Sit to/from Stand;Stand Pivot Transfers Sit to Stand: +2 physical assistance;Max assist Stand pivot transfers: Total assist;+2 safety/equipment  (sara-plus)       General transfer comment: Pt kept lt arm across stomach when coming up/down with Clarise Cruz Plus to avoid shoulder flexion. Once in standing allowed pt to place lt elbow on armrest of Sara Plus. Pt  able to keep hips and trunk extended with verbal and tactile cues. Pt with good quad activation on lt leg with standing. Rt knee blocked by knee pad of Sara Plus. Stood x 90 secs. Once in chair attempted to stand with walker. Able to bring buttocks ~6" off chair with +2 max A. Rt knee needed to be blocked  Ambulation/Gait                 Stairs            Wheelchair Mobility    Modified Rankin (Stroke Patients Only)       Balance Overall balance assessment: Needs assistance Sitting-balance support: No upper extremity supported;Feet supported Sitting balance-Leahy Scale: Good         Standing balance comment: See transfer section                    Cognition Arousal/Alertness: Awake/alert Behavior During Therapy: WFL for tasks assessed/performed Overall Cognitive Status: Within Functional Limits for tasks assessed                      Exercises      General Comments        Pertinent Vitals/Pain Pain Assessment: No/denies pain    Home Living                      Prior Function  PT Goals (current goals can now be found in the care plan section) Acute Rehab PT Goals Patient Stated Goal: to get better Progress towards PT goals: Progressing toward goals    Frequency  Min 3X/week    PT Plan Current plan remains appropriate    Co-evaluation             End of Session Equipment Utilized During Treatment: Other (comment) (Sara-plus) Activity Tolerance: Patient tolerated treatment well Patient left: in chair;with call bell/phone within reach;Other (comment) (CIR coordinator present)     Time: [id number] PT Time Calculation (min) (ACUTE ONLY): 22 min  Charges:  $Therapeutic Activity: 8-22 mins                     G Codes:      Eric [name] 03-06-2016, 1:32 PM [facility] PT [phone]

## 2016-02-21 NOTE — Progress Notes (Signed)
Eric [name]  [id number] DOB: May 13, 1951 DOA: 01/31/2016 PCP: [name], MD    Brief Narrative:  65 yo M Hx Chronic Systolic CHF, A-fib on Eliquis, HTN, Heart murmur, CKD, Hypothyroidism, and Chronic Pain Syndrome (low back) who was in his USOH until the evening of 05/08 when he began to breath very heavily while driving, before becoming unresponsive. His wife immediately got the car stopped, got him out of the car and began CPR while calling EMS.  EMS arrived within 5 - 10 minutes and upon their arrival, he received 2 defibrillations via AED. ROSC was achieved within roughly 15 - 20 minutes.  Upon ED arrival, he remained unresponsive and was subsequently intubated. PCCM was called for admission and consideration of hypothermia protocol. Cardiology was also called in consultation.  Assessment & Plan:  Cardiac arrest - VT/Vfib - CPR performed by wife and EMS - multiple shocks - Hypothermia protocol performed - Cardiology following - cardiac cath 5/25 w/o signif CAD appreciated - s/p CRT-D 5/25 per EP - pt clinically stable this AM   - doing well with IS, up to 2250 cc's  LBBB  Chronic systolic CHF  - EF [id number] per TTE April 2017 - EF 30-35% w/ diffuse hypokinesis per TEE 02/08/16  - diuresis resumed per Nephrology Filed Weights   02/19/16 0637 02/20/16 0500 02/21/16 0500  Weight: 107.8 kg (237 lb 10.5 oz) 109.09 kg (240 lb 8 oz) 107.004 kg (235 lb 14.4 oz)   Aortic aneurysm / aortic root enlargement  - 5.2 cm aortic root dilation per TEE 02/08/16  - management per cardiology   Aortic valve regurgitation - moderate - Cards following - to consider CT in 6 months in f/u  - on TEE 02/08/16  Acute blood loss anemia / Bilateral Retroperitoneal Bleeds - no signs of bleeding but Hg is down compared to few days ago - monitor closely   Bilateral LE weakness and numbness - believed to be due to bilateral retroperitoneal bleeds w/ compression of B femoral nerves  -  continues to slowly improve  - agreeable to IR, bed pending   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 - Baseline Cr~1.9 - Nephrology following  - Cr appears to be back to his baseline - BMP in AM   HTN, essential  - reasonably stable   Paroxysmal A.fib  - NSR at present   CITROBACTER KOSERI Aspiration Pneumonia  - Completed 7 day course antibiotics - resolved   Acute encephalopathy  - Resolved   Hyperkalemia -resolved  Rhabdomyolysis  - resolved - Atorvastatin stopped 5/17  Hypothyroidism - Synthroid 75 g daily  - TSH normal   DM with complications of CKD - 123456 7.1 - cont SSI - CBG controlled   DVT prophylaxis: SQ heparin  Code Status: FULL CODE Family Communication: no family present at time of exam  Disposition Plan: hopeful for CIR when bed available   Consultants:  [facility] Cardiology  Nephrology  Neurology PCCM   Significant Events: 05/08 - cardiac arrest. Started on TTM - 33 degrees C CT head 05/08 > no acute process Echo 5/9 > EF 30 to 35%, severe aortic regurgitation EEG 5/9 > no focal, hemispheric, or lateralizing features; demonstrated a burst/suppression pattern, which can be consistent with a sedated EEG 5/11>>mod diffuse slowing, No epileptiform activity was recorded. 5/10 pt rewarmed per hypothermia protocol 5/14 > Extubated 5/15 Hip spine films > no acute abnormality 5/16 TEE - EF= 30%- 35%. Diffuse hypokinesis - Aortic valve: Moderate  regurgitation.- Left atrium: Moderately dilated.  5/19 MRI L-spine - Bilateral retroperitoneal hemorrhage involving the psoas musculature. Follow-up CT Abdomen and Pelvis could characterize further 5/25 - cardiac cath - CRT-D implantation  5/26 transfer to tele bed  Antimicrobials:  Zosyn 5/11> 5/16 Vancomycin 5/11> 5/14 Ceftriaxone 5/16 > 5/18  Subjective: No new complaints.  Motivated to work with therapy.    Objective: Blood pressure 142/76, pulse 82, temperature 98.7 F (37.1 C), temperature source Oral,  resp. rate 20, height 5\' 9"  (1.753 m), weight 107.004 kg (235 lb 14.4 oz), SpO2 98 %.  Intake/Output Summary (Last 24 hours) at 02/21/16 0715 Last data filed at 02/21/16 0445  Gross per 24 hour  Intake    750 ml  Output   2050 ml  Net  -1300 ml   Filed Weights   02/19/16 0637 02/20/16 0500 02/21/16 0500  Weight: 107.8 kg (237 lb 10.5 oz) 109.09 kg (240 lb 8 oz) 107.004 kg (235 lb 14.4 oz)    Examination: General: No acute respiratory distress - alert and conversant  Lungs: Clear to auscultation bilaterally  Cardiovascular: Regular rate and rhythm without murmur  Abdomen: Nontender, nondistended, soft, bowel sounds positive, no rebound Extremities: No significant cyanosis, or clubbing - 1+ pedal edema B LE but worse on the left side   CBC:  Recent Labs Lab 02/15/16 0222 02/16/16 0532 02/17/16 0337 02/17/16 1903 02/21/16 0229  WBC 17.3* 15.4* 14.3* 9.8 9.3  HGB 9.0* 9.2* 9.2* 10.0* 8.7*  HCT 28.4* 28.9* 29.0* 31.6* 27.4*  MCV 93.7 94.4 94.5 93.8 92.3  PLT 213 222 218 233 [id number]   Basic Metabolic Panel:  Recent Labs Lab 02/15/16 0222 02/16/16 0532 02/17/16 [id number] 02/17/16 1903 02/18/16 0234 02/19/16 0412 02/20/16 0232 02/21/16 0229  NA 137 135 133*  --  133* 134* 133* 132*  K 4.1 4.2 4.3  --  4.4 4.3 4.2 4.3  CL 108 108 107  --  108 106 106 107  CO2 22 20* 21*  --  21* 19* 20* 20*  GLUCOSE 155* 130* 118*  --  114* 122* 111* 105*  BUN 42* 38* 32*  --  28* 30* 26* 28*  CREATININE 1.05 1.60* 1.55* 1.49* 1.53* 1.50* 1.58* 1.55*  CALCIUM 8.3* 8.4* 8.4*  --  8.2* 8.3* 8.4* 8.3*  MG 2.3 2.2 2.1  --  2.0  --   --   --   PHOS  --   --  2.7  --  3.2  --   --  2.7   Liver Function Tests:  Recent Labs Lab 02/17/16 0337 02/18/16 0234 02/19/16 0412 02/21/16 0229  AST 64*  --  52*  --   ALT 41  --  21  --   ALKPHOS 60  --  63  --   BILITOT 1.6*  --  1.0  --   PROT 5.9*  --  5.9*  --   ALBUMIN 2.2*  2.3* 2.3* 2.1* 2.3*    Cardiac Enzymes:  Recent Labs Lab  02/15/16 0222 02/16/16 0532 02/20/16 0232  CKTOTAL 1474* 776* 268    HbA1C: HGB A1C MFR BLD  Date/Time Value Ref Range Status  02/10/2016 03:47 PM 7.1* 4.8 - 5.6 % Final    Comment:    (NOTE)         Pre-diabetes: 5.7 - 6.4         Diabetes: >6.4         Glycemic control for adults with diabetes: <7.0   02/14/2012  06:20 AM 6.1* <5.7 % Final    Comment:    (NOTE)                                                                       According to the ADA Clinical Practice Recommendations for 2011, when HbA1c is used as a screening test:  >=6.5%   Diagnostic of Diabetes Mellitus           (if abnormal result is confirmed) 5.7-6.4%   Increased risk of developing Diabetes Mellitus References:Diagnosis and Classification of Diabetes Mellitus,Diabetes [id number] 1):S62-S69 and Standards of Medical Care in         Diabetes - 2011,Diabetes [id number] (Suppl 1):S11-S61.    CBG:  Recent Labs Lab 02/20/16 0606 02/20/16 1119 02/20/16 1620 02/20/16 2059 02/21/16 0601  GLUCAP 119* 145* 132* 148* 109*    Recent Results (from the past 240 hour(s))  Surgical pcr screen     Status: Abnormal   Collection Time: 02/17/16  5:30 AM  Result Value Ref Range Status   MRSA, PCR NEGATIVE NEGATIVE Final   Staphylococcus aureus POSITIVE (A) NEGATIVE Final    Comment:        The Xpert SA Assay (FDA approved for NASAL specimens in patients over 33 years of age), is one component of a comprehensive surveillance program.  Test performance has been validated by [facility] for patients greater than or equal to 12 year old. It is not intended to diagnose infection nor to guide or monitor treatment.      Scheduled Meds: . amiodarone  400 mg Oral Daily  . carvedilol  25 mg Oral BID WC  . docusate sodium  100 mg Oral BID  . furosemide  40 mg Oral Daily  . heparin subcutaneous  5,000 Units Subcutaneous Q8H  . insulin aspart  0-5 Units Subcutaneous QHS  . insulin aspart  0-9  Units Subcutaneous TID WC  . isosorbide-hydrALAZINE  0.5 tablet Oral TID  . levothyroxine  75 mcg Oral QAC breakfast  . polyethylene glycol  17 g Oral Daily   Continuous Infusions: . sodium chloride 10 mL/hr at 02/11/16 1757     LOS: 20 days   Time spent: 25 minutes   Mart Piggs, MD [facility] Office  [phone]  If 7PM-7AM, please contact night-coverage [email or web address] Password [email or web address] 02/21/2016, 7:15 AM

## 2016-02-21 NOTE — Clinical Social Work Note (Signed)
CSW met with patient. CSW presented  bed offers. CSW continuing to follow for support and discharge needs.  [employer], LCSW [phone]

## 2016-02-22 ENCOUNTER — Inpatient Hospital Stay (HOSPITAL_COMMUNITY)
Admission: RE | Admit: 2016-02-22 | Discharge: 2016-03-10 | DRG: 945 | Disposition: A | Discharge status: Home Health | Payer: BC Managed Care – PPO | Source: Intra-hospital | Attending: Physical Medicine & Rehabilitation | Admitting: Physical Medicine & Rehabilitation

## 2016-02-22 DIAGNOSIS — N183 Chronic kidney disease, stage 3 (moderate): Secondary | ICD-10-CM | POA: Diagnosis not present

## 2016-02-22 DIAGNOSIS — Z09 Encounter for follow-up examination after completed treatment for conditions other than malignant neoplasm: Secondary | ICD-10-CM

## 2016-02-22 DIAGNOSIS — R5381 Other malaise: Principal | ICD-10-CM

## 2016-02-22 DIAGNOSIS — N1832 Chronic kidney disease, stage 3b: Secondary | ICD-10-CM | POA: Insufficient documentation

## 2016-02-22 DIAGNOSIS — K5900 Constipation, unspecified: Secondary | ICD-10-CM | POA: Diagnosis present

## 2016-02-22 DIAGNOSIS — Z95810 Presence of automatic (implantable) cardiac defibrillator: Secondary | ICD-10-CM

## 2016-02-22 DIAGNOSIS — E1122 Type 2 diabetes mellitus with diabetic chronic kidney disease: Secondary | ICD-10-CM | POA: Diagnosis present

## 2016-02-22 DIAGNOSIS — D649 Anemia, unspecified: Secondary | ICD-10-CM | POA: Diagnosis present

## 2016-02-22 DIAGNOSIS — S7411XS Injury of femoral nerve at hip and thigh level, right leg, sequela: Secondary | ICD-10-CM | POA: Diagnosis not present

## 2016-02-22 DIAGNOSIS — S7410XD Injury of femoral nerve at hip and thigh level, unspecified leg, subsequent encounter: Secondary | ICD-10-CM | POA: Diagnosis not present

## 2016-02-22 DIAGNOSIS — I5022 Chronic systolic (congestive) heart failure: Secondary | ICD-10-CM | POA: Diagnosis present

## 2016-02-22 DIAGNOSIS — K661 Hemoperitoneum: Secondary | ICD-10-CM | POA: Diagnosis not present

## 2016-02-22 DIAGNOSIS — E119 Type 2 diabetes mellitus without complications: Secondary | ICD-10-CM | POA: Diagnosis not present

## 2016-02-22 DIAGNOSIS — S7410XS Injury of femoral nerve at hip and thigh level, unspecified leg, sequela: Secondary | ICD-10-CM | POA: Diagnosis not present

## 2016-02-22 DIAGNOSIS — M898X6 Other specified disorders of bone, lower leg: Secondary | ICD-10-CM | POA: Insufficient documentation

## 2016-02-22 DIAGNOSIS — Z7901 Long term (current) use of anticoagulants: Secondary | ICD-10-CM | POA: Diagnosis not present

## 2016-02-22 DIAGNOSIS — E739 Lactose intolerance, unspecified: Secondary | ICD-10-CM | POA: Diagnosis present

## 2016-02-22 DIAGNOSIS — Z8249 Family history of ischemic heart disease and other diseases of the circulatory system: Secondary | ICD-10-CM | POA: Diagnosis not present

## 2016-02-22 DIAGNOSIS — G8929 Other chronic pain: Secondary | ICD-10-CM | POA: Diagnosis present

## 2016-02-22 DIAGNOSIS — M25571 Pain in right ankle and joints of right foot: Secondary | ICD-10-CM | POA: Insufficient documentation

## 2016-02-22 DIAGNOSIS — G9340 Encephalopathy, unspecified: Secondary | ICD-10-CM | POA: Diagnosis present

## 2016-02-22 DIAGNOSIS — E871 Hypo-osmolality and hyponatremia: Secondary | ICD-10-CM | POA: Diagnosis present

## 2016-02-22 DIAGNOSIS — N189 Chronic kidney disease, unspecified: Secondary | ICD-10-CM | POA: Insufficient documentation

## 2016-02-22 DIAGNOSIS — E669 Obesity, unspecified: Secondary | ICD-10-CM | POA: Diagnosis present

## 2016-02-22 DIAGNOSIS — R002 Palpitations: Secondary | ICD-10-CM | POA: Diagnosis present

## 2016-02-22 DIAGNOSIS — D62 Acute posthemorrhagic anemia: Secondary | ICD-10-CM | POA: Insufficient documentation

## 2016-02-22 DIAGNOSIS — Z833 Family history of diabetes mellitus: Secondary | ICD-10-CM | POA: Diagnosis not present

## 2016-02-22 DIAGNOSIS — Z6833 Body mass index (BMI) 33.0-33.9, adult: Secondary | ICD-10-CM | POA: Diagnosis not present

## 2016-02-22 DIAGNOSIS — R0609 Other forms of dyspnea: Secondary | ICD-10-CM | POA: Diagnosis present

## 2016-02-22 DIAGNOSIS — I130 Hypertensive heart and chronic kidney disease with heart failure and stage 1 through stage 4 chronic kidney disease, or unspecified chronic kidney disease: Secondary | ICD-10-CM | POA: Diagnosis present

## 2016-02-22 DIAGNOSIS — Z8674 Personal history of sudden cardiac arrest: Secondary | ICD-10-CM | POA: Diagnosis not present

## 2016-02-22 DIAGNOSIS — E039 Hypothyroidism, unspecified: Secondary | ICD-10-CM | POA: Diagnosis present

## 2016-02-22 DIAGNOSIS — R58 Hemorrhage, not elsewhere classified: Secondary | ICD-10-CM | POA: Diagnosis present

## 2016-02-22 DIAGNOSIS — I255 Ischemic cardiomyopathy: Secondary | ICD-10-CM | POA: Diagnosis present

## 2016-02-22 DIAGNOSIS — S7410XA Injury of femoral nerve at hip and thigh level, unspecified leg, initial encounter: Secondary | ICD-10-CM

## 2016-02-22 DIAGNOSIS — I469 Cardiac arrest, cause unspecified: Secondary | ICD-10-CM | POA: Diagnosis present

## 2016-02-22 DIAGNOSIS — M25561 Pain in right knee: Secondary | ICD-10-CM | POA: Insufficient documentation

## 2016-02-22 DIAGNOSIS — R51 Headache: Secondary | ICD-10-CM | POA: Diagnosis present

## 2016-02-22 DIAGNOSIS — M10071 Idiopathic gout, right ankle and foot: Secondary | ICD-10-CM | POA: Insufficient documentation

## 2016-02-22 DIAGNOSIS — R52 Pain, unspecified: Secondary | ICD-10-CM

## 2016-02-22 DIAGNOSIS — I4891 Unspecified atrial fibrillation: Secondary | ICD-10-CM | POA: Diagnosis present

## 2016-02-22 DIAGNOSIS — M7989 Other specified soft tissue disorders: Secondary | ICD-10-CM | POA: Diagnosis present

## 2016-02-22 DIAGNOSIS — J9601 Acute respiratory failure with hypoxia: Secondary | ICD-10-CM | POA: Diagnosis not present

## 2016-02-22 DIAGNOSIS — G5723 Lesion of femoral nerve, bilateral lower limbs: Secondary | ICD-10-CM | POA: Diagnosis present

## 2016-02-22 DIAGNOSIS — Z79899 Other long term (current) drug therapy: Secondary | ICD-10-CM | POA: Diagnosis not present

## 2016-02-22 DIAGNOSIS — M545 Low back pain: Secondary | ICD-10-CM | POA: Diagnosis not present

## 2016-02-22 DIAGNOSIS — R0989 Other specified symptoms and signs involving the circulatory and respiratory systems: Secondary | ICD-10-CM | POA: Insufficient documentation

## 2016-02-22 HISTORY — DX: Injury of femoral nerve at hip and thigh level, unspecified leg, initial encounter: S74.10XA

## 2016-02-22 HISTORY — DX: Other malaise: R53.81

## 2016-02-22 LAB — CREATININE, SERUM
CREATININE: 1.72 mg/dL — AB (ref 0.61–1.24)
GFR calc Af Amer: 47 mL/min — ABNORMAL LOW (ref >60)
GFR calc non Af Amer: 40 mL/min — ABNORMAL LOW (ref >60)

## 2016-02-22 LAB — GLUCOSE, CAPILLARY
Glucose-Capillary: 102 mg/dL — ABNORMAL HIGH (ref 65–99)
Glucose-Capillary: 140 mg/dL — ABNORMAL HIGH (ref 65–99)

## 2016-02-22 LAB — BASIC METABOLIC PANEL
ANION GAP: 8 (ref 5–15)
BUN: 26 mg/dL — AB (ref 6–20)
CHLORIDE: 107 mmol/L (ref 101–111)
CO2: 19 mmol/L — ABNORMAL LOW (ref 22–32)
Calcium: 8.7 mg/dL — ABNORMAL LOW (ref 8.9–10.3)
Creatinine, Ser: 1.53 mg/dL — ABNORMAL HIGH (ref 0.61–1.24)
GFR calc Af Amer: 54 mL/min — ABNORMAL LOW (ref >60)
GFR, EST NON AFRICAN AMERICAN: 46 mL/min — AB (ref >60)
Glucose, Bld: 103 mg/dL — ABNORMAL HIGH (ref 65–99)
POTASSIUM: 4.3 mmol/L (ref 3.5–5.1)
Sodium: 134 mmol/L — ABNORMAL LOW (ref 135–145)

## 2016-02-22 LAB — CBC
HCT: 30.3 % — ABNORMAL LOW (ref 39.0–52.0)
HEMATOCRIT: 29.5 % — AB (ref 39.0–52.0)
Hemoglobin: 9.4 g/dL — ABNORMAL LOW (ref 13.0–17.0)
Hemoglobin: 9.5 g/dL — ABNORMAL LOW (ref 13.0–17.0)
MCH: 29.6 pg (ref 26.0–34.0)
MCH: 29.3 pg (ref 26.0–34.0)
MCHC: 31.9 g/dL (ref 30.0–36.0)
MCHC: 31.4 g/dL (ref 30.0–36.0)
MCV: 92.8 fL (ref 78.0–100.0)
MCV: 93.5 fL (ref 78.0–100.0)
PLATELETS: 307 10*3/uL (ref 150–400)
PLATELETS: 319 10*3/uL (ref 150–400)
RBC: 3.18 MIL/uL — AB (ref 4.22–5.81)
RBC: 3.24 MIL/uL — ABNORMAL LOW (ref 4.22–5.81)
RDW: 15.1 % (ref 11.5–15.5)
RDW: 15.4 % (ref 11.5–15.5)
WBC: 8.6 10*3/uL (ref 4.0–10.5)
WBC: 8.1 10*3/uL (ref 4.0–10.5)

## 2016-02-22 MED ORDER — POLYETHYLENE GLYCOL 3350 17 G PO PACK
17.0000 g | PACK | Freq: Every day | ORAL | Status: DC
  Administered 2016-02-24 – 2016-03-09 (×2): 17 g via ORAL
  Filled 2016-02-22 (×12): qty 1

## 2016-02-22 MED ORDER — HEPARIN SODIUM (PORCINE) 5000 UNIT/ML IJ SOLN: 5000.0000 [IU] | Freq: Three times a day (TID) | INTRAMUSCULAR | Status: DC

## 2016-02-22 MED ORDER — ONDANSETRON HCL 4 MG PO TABS
4.0000 mg | ORAL_TABLET | Freq: Four times a day (QID) | ORAL | Status: DC | PRN
  Filled 2016-02-22: qty 1

## 2016-02-22 MED ORDER — SORBITOL 70 % SOLN
30.0000 mL | Freq: Every day | Status: DC | PRN
Start: 2016-02-22 — End: 2016-03-10

## 2016-02-22 MED ORDER — BISACODYL 5 MG PO TBEC: 5.0000 mg | DELAYED_RELEASE_TABLET | Freq: Every day | ORAL | Status: DC | PRN

## 2016-02-22 MED ORDER — AMIODARONE HCL 200 MG PO TABS
400.0000 mg | ORAL_TABLET | Freq: Every day | ORAL | Status: DC
  Administered 2016-02-23 – 2016-03-10 (×17): 400 mg via ORAL
  Filled 2016-02-22 (×18): qty 2

## 2016-02-22 MED ORDER — CARVEDILOL 25 MG PO TABS: 25.0000 mg | ORAL_TABLET | Freq: Two times a day (BID) | ORAL | Status: DC

## 2016-02-22 MED ORDER — ISOSORB DINITRATE-HYDRALAZINE 20-37.5 MG PO TABS
0.5000 | ORAL_TABLET | Freq: Three times a day (TID) | ORAL | Status: DC
  Administered 2016-02-22 – 2016-03-10 (×49): 0.5 via ORAL
  Filled 2016-02-22: qty 0.5
  Filled 2016-02-22: qty 1
  Filled 2016-02-22: qty 0.5
  Filled 2016-02-22 (×4): qty 1
  Filled 2016-02-22: qty 0.5
  Filled 2016-02-22: qty 1
  Filled 2016-02-22 (×4): qty 0.5
  Filled 2016-02-22: qty 1
  Filled 2016-02-22 (×3): qty 0.5
  Filled 2016-02-22: qty 1
  Filled 2016-02-22: qty 0.5
  Filled 2016-02-22: qty 1
  Filled 2016-02-22: qty 0.5
  Filled 2016-02-22: qty 1
  Filled 2016-02-22 (×3): qty 0.5
  Filled 2016-02-22: qty 1
  Filled 2016-02-22 (×2): qty 0.5
  Filled 2016-02-22 (×3): qty 1
  Filled 2016-02-22: qty 0.5
  Filled 2016-02-22: qty 1
  Filled 2016-02-22 (×2): qty 0.5
  Filled 2016-02-22: qty 1
  Filled 2016-02-22 (×2): qty 0.5
  Filled 2016-02-22 (×2): qty 1
  Filled 2016-02-22 (×5): qty 0.5
  Filled 2016-02-22: qty 1
  Filled 2016-02-22: qty 0.5
  Filled 2016-02-22 (×2): qty 1
  Filled 2016-02-22 (×3): qty 0.5

## 2016-02-22 MED ORDER — FUROSEMIDE 40 MG PO TABS
40.0000 mg | ORAL_TABLET | Freq: Every day | ORAL | Status: DC
  Administered 2016-02-23 – 2016-03-10 (×17): 40 mg via ORAL
  Filled 2016-02-22 (×10): qty 1
  Filled 2016-02-22: qty 2
  Filled 2016-02-22 (×6): qty 1

## 2016-02-22 MED ORDER — OXYCODONE-ACETAMINOPHEN 5-325 MG PO TABS
1.0000 | ORAL_TABLET | Freq: Four times a day (QID) | ORAL | Status: DC | PRN
  Administered 2016-02-23 – 2016-03-10 (×36): 1 via ORAL
  Filled 2016-02-22 (×38): qty 1

## 2016-02-22 MED ORDER — HEPARIN SODIUM (PORCINE) 5000 UNIT/ML IJ SOLN
5000.0000 [IU] | Freq: Three times a day (TID) | INTRAMUSCULAR | Status: DC
  Administered 2016-02-22 – 2016-03-10 (×50): 5000 [IU] via SUBCUTANEOUS
  Filled 2016-02-22 (×49): qty 1

## 2016-02-22 MED ORDER — ONDANSETRON HCL 4 MG/2ML IJ SOLN: 4.0000 mg | Freq: Four times a day (QID) | INTRAMUSCULAR | Status: DC | PRN

## 2016-02-22 MED ORDER — ACETAMINOPHEN 325 MG PO TABS
325.0000 mg | ORAL_TABLET | ORAL | Status: DC | PRN
  Administered 2016-02-24: 650 mg via ORAL
  Filled 2016-02-22: qty 2

## 2016-02-22 MED ORDER — AMIODARONE HCL 400 MG PO TABS: 400.0000 mg | ORAL_TABLET | Freq: Every day | ORAL | Status: DC

## 2016-02-22 MED ORDER — OXYCODONE-ACETAMINOPHEN 5-325 MG PO TABS: 1.0000 | ORAL_TABLET | Freq: Four times a day (QID) | ORAL | Status: DC | PRN

## 2016-02-22 MED ORDER — DOCUSATE SODIUM 100 MG PO CAPS
100.0000 mg | ORAL_CAPSULE | Freq: Two times a day (BID) | ORAL | Status: DC
  Administered 2016-02-22 – 2016-03-10 (×33): 100 mg via ORAL
  Filled 2016-02-22 (×34): qty 1

## 2016-02-22 MED ORDER — ISOSORB DINITRATE-HYDRALAZINE 20-37.5 MG PO TABS: 0.5000 | ORAL_TABLET | Freq: Three times a day (TID) | ORAL | Status: DC

## 2016-02-22 MED ORDER — LEVOTHYROXINE SODIUM 75 MCG PO TABS
75.0000 ug | ORAL_TABLET | Freq: Every day | ORAL | Status: DC
  Administered 2016-02-23 – 2016-03-10 (×17): 75 ug via ORAL
  Filled 2016-02-22 (×17): qty 1

## 2016-02-22 MED ORDER — CARVEDILOL 25 MG PO TABS
25.0000 mg | ORAL_TABLET | Freq: Two times a day (BID) | ORAL | Status: DC
  Administered 2016-02-23 – 2016-03-10 (×33): 25 mg via ORAL
  Filled 2016-02-22 (×34): qty 1

## 2016-02-22 NOTE — Clinical Social Work Note (Signed)
CSW informed by [facility] that patient will admit to Inpatient Rehab today. CSW signing off.  [employer], LCSW [phone]

## 2016-02-22 NOTE — Progress Notes (Signed)
Physical Therapy Treatment Patient Details Name: Eric [name] MRN: [id number] DOB: 1951/01/16 Today's Date: 02/22/2016    History of Present Illness 65 y.o. M with known sCHF and A.fib, presented 05/08 after out of hospital cardiac arrest. ROSC within 15-20 minutes after defib x 2 (vtach to vfib suspected). ETT 5/8-5/14. Now with acute encephalopathy with concern for anoxia per MD notes. 5/17 noted incr bil leg weakness and back pain; 5/19 MRI Bilateral retroperitoneal hemorrhage involving the psoas muscles; 5/22 neuro consult and determined decompression of nerves not indicated; 5/25 cardiac cath and s/p BiV ICD implant    PT Comments    Pt doing well, very motivated to progress and work with PT; decr assist with bed mobility and increasing activity tolerance today; pt is hopeful for CIR  Follow Up Recommendations  CIR;Supervision/Assistance - 24 hour     Equipment Recommendations  Other (comment) (tba)    Recommendations for Other Services       Precautions / Restrictions Precautions Precautions: Fall;ICD/Pacemaker Precaution Comments: Therapist on 5/26 spoke with EP PA re: heavy use of LUE due to LE weakness. She emphasized to avoid movement at shoulder (flexion, abdct, reaching behind/internal rotation/extension) or stretching of upper chest are primary concerns to avoid. Agreed he could weightbear through LUE with elbow near/at his side.  Restrictions LUE Weight Bearing: Non weight bearing (ICD placed)    Mobility  Bed Mobility Overal bed mobility: Needs Assistance Bed Mobility: Supine to Sit     Supine to sit: HOB elevated;Min assist;Mod assist (55*)     General bed mobility comments: partial roll to L to allow use of rail/RUE assist to come to sit; pt requires incr time and assist to bring bil LEs completely off bed/onto bed and assist with trunk to full upright  Transfers                 General transfer comment: transfers NT today with +1 assist, no drop  arm chairs available  Ambulation/Gait                 Stairs            Wheelchair Mobility    Modified Rankin (Stroke Patients Only)       Balance Overall balance assessment: Needs assistance   Sitting balance-Leahy Scale: Good Sitting balance - Comments:  able to sit without hands on bed, perform trunk flexion/extension with scapular protraction/retraction  x5 while EOB                            Cognition Arousal/Alertness: Awake/alert Behavior During Therapy: WFL for tasks assessed/performed Overall Cognitive Status: Within Functional Limits for tasks assessed                      Exercises General Exercises - Lower Extremity Ankle Circles/Pumps: AROM;Both;10 reps Quad Sets: Strengthening;Both;10 reps;Supine Gluteal Sets: AROM;Strengthening;10 reps;Supine Short Arc Quad: AROM;AAROM;Strengthening;Both;10 reps;Supine (focus on eccentric on L, tactile cues; requires AA on R) Long Arc Quad: AROM;AAROM;Strengthening;Both;10 reps;Seated Heel Slides: AAROM;Strengthening;10 reps;Supine (manual  resistance on extension) Hip ABduction/ADduction: AROM;Strengthening;AAROM;Both;10 reps;Supine Straight Leg Raises: AAROM;Strengthening;Left;10 reps;Supine Hip Flexion/Marching: Left;AROM;Strengthening;10 reps;Seated Other Exercises Other Exercises: isometric hip abduciton bil x10;  Other Exercises: assisted bridging x3, also utilized bridging, bed position and RUE to scoot up in bed in supine    General Comments        Pertinent Vitals/Pain Pain Assessment: No/denies pain    Home Living  Prior Function            PT Goals (current goals can now be found in the care plan section) Acute Rehab PT Goals Patient Stated Goal: to get better PT Goal Formulation: With patient Time For Goal Achievement: 02/21/16 Potential to Achieve Goals: Good Progress towards PT goals: Progressing toward goals    Frequency  Min  3X/week    PT Plan Current plan remains appropriate    Co-evaluation             End of Session   Activity Tolerance: Patient tolerated treatment well Patient left: in bed;with call bell/phone within reach     Time: [id number] PT Time Calculation (min) (ACUTE ONLY): 27 min  Charges:  $Therapeutic Exercise: 23-37 mins                    G Codes:      Eric [name] 2016-03-10, 10:46 AM

## 2016-02-22 NOTE — Interval H&P Note (Signed)
Eric [name] was admitted today to Inpatient Rehabilitation with the diagnosis of debility/encephalopathy.  The patient's history has been reviewed, patient examined, and there is no change in status.  Patient continues to be appropriate for intensive inpatient rehabilitation.  I have reviewed the patient's chart and labs.  Questions were answered to the patient's satisfaction. The PAPE has been reviewed and assessment remains appropriate.  Eric [name] T 02/22/2016, 6:24 PM

## 2016-02-22 NOTE — Discharge Summary (Signed)
Physician Discharge Summary  Eric [name] [id number] DOB: [birth year] DOA: 01/31/2016  PCP: [name], MD  Admit date: 01/31/2016 Discharge date: 02/22/2016  Recommendations for Outpatient Follow-up:  1. Pt will be discharged to inpatient rehab here at [address] [facility] 2. Monitor BMP for kidney function as well as CBC to make sure Hg remains stable 3. Please note that Eliquis has been stopped due to bleeding  4. Pt had elevated LFT's and CK level so statin was held while inpatient, pt advised to resume statin on discharge but please check LFT's on follow up and see if statin can be continued  5. Also A1C is 7.1, pt wants to try trial of diet and exercise and to have A1C repeated in three months, please see if antihyperglycemic regimen needs to be restarted   Discharge Diagnoses:  Principal Problem:   Cardiac arrest [facility]) Active Problems:   Chronic systolic heart failure ([place])   Hyperlipidemia  Discharge Condition: Stable  Diet recommendation: Heart healthy diet discussed in details    Brief Narrative:  65 yo M Hx Chronic Systolic CHF, A-fib on Eliquis, HTN, Heart murmur, CKD, Hypothyroidism, and Chronic Pain Syndrome (low back) who was in his USOH until the evening of 05/08 when he began to breath very heavily while driving, before becoming unresponsive. His wife immediately got the car stopped, got him out of the car and began CPR while calling EMS. EMS arrived within 5 - 10 minutes and upon their arrival, he received 2 defibrillations via AED. ROSC was achieved within roughly 15 - 20 minutes.  Upon ED arrival, he remained unresponsive and was subsequently intubated. PCCM was called for admission and consideration of hypothermia protocol. Cardiology was also called in consultation.  Assessment & Plan:  Cardiac arrest - VT/Vfib, LBBB - CPR performed by wife and EMS - multiple shocks - Hypothermia protocol performed - Cardiology following - cardiac cath 5/25 w/o signif CAD  appreciated - s/p CRT-D 5/25 per EP - pt clinically stable this AM  - ready to go to IR  Chronic systolic CHF  - EF A999333 per TTE April 2017 - EF 30-35% w/ diffuse hypokinesis per TEE 02/08/16  - continue lasix - also continue Coreg and Bidil per cardiology - not a candidate for ACEI/ARB due to AKI  Aortic aneurysm / aortic root enlargement  - 5.2 cm aortic root dilation per TEE 02/08/16  - cardiology team has singed off   Aortic valve regurgitation - moderate - Cards following - to consider CT in 6 months in f/u  - on TEE 02/08/16  Acute blood loss anemia / Bilateral Retroperitoneal Bleeds - no signs of bleeding but Hg is down compared to few days ago - monitor intermittently   Bilateral LE weakness and numbness - believed to be due to bilateral retroperitoneal bleeds w/ compression of B femoral nerves  - continues to slowly improve  - agreeable to IR, plan to go today   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 - Baseline Cr~1.9 - Nephrology following  - Cr appears to be back to his baseline - BMP in AM  HTN, essential  - reasonably stable   Paroxysmal A.fib  - NSR at present  - not on Eliquis due to bleeding   CITROBACTER KOSERI Aspiration Pneumonia  - Completed 7 day course antibiotics - resolved   Acute encephalopathy  - Resolved   Hyperkalemia -resolved  Rhabdomyolysis  - resolved - Atorvastatin stopped 5/17 but can be resumed upon discharge as CK stable and  LFT's improved  - if noted that LFT's trending up, consider stopping altogether   Hypothyroidism - Synthroid 75 g daily  - TSH normal   DM with complications of CKD - 123456 7.1  - pt prefers to follow up with PCP and have repeat A1C done to decide on further regimen   Code Status: FULL CODE Family Communication: no family present at time of exam  Disposition Plan: IR  Consultants:  [facility] Cardiology  Nephrology  Neurology PCCM   Significant Events: 05/08 - cardiac arrest. Started  on TTM - 33 degrees C CT head 05/08 > no acute process Echo 5/9 > EF 30 to 35%, severe aortic regurgitation EEG 5/9 > no focal, hemispheric, or lateralizing features; demonstrated a burst/suppression pattern, which can be consistent with a sedated EEG 5/11>>mod diffuse slowing, No epileptiform activity was recorded. 5/10 pt rewarmed per hypothermia protocol 5/14 > Extubated 5/15 Hip spine films > no acute abnormality 5/16 TEE - EF= 30%- 35%. Diffuse hypokinesis - Aortic valve: Moderate regurgitation.- Left atrium: Moderately dilated.  5/19 MRI L-spine - Bilateral retroperitoneal hemorrhage involving the psoas musculature. Follow-up CT Abdomen and Pelvis could characterize further 5/25 - cardiac cath - CRT-D implantation  5/26 transfer to tele bed  Antimicrobials:  Zosyn 5/11> 5/16 Vancomycin 5/11> 5/14 Ceftriaxone 5/16 > 5/18      Discharge Exam: Filed Vitals:   02/22/16 0527 02/22/16 1253  BP: 129/71 108/70  Pulse: 87 86  Temp: 99 F (37.2 C) 97.7 F (36.5 C)  Resp: 18 18   Filed Vitals:   02/21/16 1359 02/21/16 1930 02/22/16 0527 02/22/16 1253  BP: 114/56 107/59 129/71 108/70  Pulse: 86 80 87 86  Temp: 97.5 F (36.4 C) 98.2 F (36.8 C) 99 F (37.2 C) 97.7 F (36.5 C)  TempSrc: Oral Oral Oral Tympanic  Resp: 20 20 18 18   Height:      Weight:   107.094 kg (236 lb 1.6 oz)   SpO2: 100% 100% 99% 100%    General: Pt is alert, follows commands appropriately, not in acute distress Cardiovascular: Regular rate and rhythm, no rubs, no gallops Respiratory: Clear to auscultation bilaterally, no wheezing, no crackles, no rhonchi Abdominal: Soft, non tender, non distended, bowel sounds +, no guarding Extremities: + bilateral LE pitting edema, no cyanosis, pulses palpable bilaterally DP and PT Neuro: Grossly nonfocal  Discharge Instructions  Discharge Instructions    Diet - low sodium heart healthy    Complete by:  As directed      Increase activity slowly    Complete  by:  As directed             Medication List    STOP taking these medications        ELIQUIS 5 MG Tabs tablet  Generic drug:  apixaban     spironolactone 25 MG tablet  Commonly known as:  ALDACTONE      TAKE these medications        amiodarone 400 MG tablet  Commonly known as:  PACERONE  Take 1 tablet (400 mg total) by mouth daily.     atorvastatin 20 MG tablet  Commonly known as:  LIPITOR  TAKE 1 TABLET BY MOUTH DAILY     b complex vitamins tablet  Take 1 tablet by mouth daily as needed (cold/ energy).     carvedilol 25 MG tablet  Commonly known as:  COREG  Take 1 tablet (25 mg total) by mouth 2 (two) times daily.  Cholecalciferol 2000 units Caps  Take 2,000 Units by mouth 3 (three) times a week.     CoQ10 200 MG Caps  Take 200 mg by mouth daily.     fluticasone 50 MCG/ACT nasal spray  Commonly known as:  FLONASE  Place 2 sprays into the nose as needed for allergies.     furosemide 40 MG tablet  Commonly known as:  LASIX  TAKE 1 TABLET BY MOUTH DAILY     isosorbide-hydrALAZINE 20-37.5 MG tablet  Commonly known as:  BIDIL  Take 0.5 tablets by mouth 3 (three) times daily.     levothyroxine 75 MCG tablet  Commonly known as:  SYNTHROID, LEVOTHROID  Take 1 tablet (75 mcg total) by mouth daily before breakfast.     magnesium oxide 400 MG tablet  Commonly known as:  MAG-OX  Take 400 mg by mouth daily.     oxyCODONE-acetaminophen 5-325 MG tablet  Commonly known as:  PERCOCET/ROXICET  Take 1 tablet by mouth every 6 (six) hours as needed for moderate pain.     vitamin C with rose hips 1000 MG tablet  Take 1,000 mg by mouth daily.           Follow-up Information    Follow up with [facility] On 02/28/2016.   Specialty:  Cardiology   Why:  2:00PM, wound check   Contact information:   [address] [place] [phone]      Follow up with Cristopher Peru, MD On 05/25/2016.   Specialty:   Cardiology   Why:  8:15AM   Contact information:   [address] [place] [address] [phone]       Follow up with [name], MD.   Specialty:  Internal Medicine   Contact information:   [address] [place] [address] [phone]       Call Faye Ramsay, MD.   Specialty:  Internal Medicine   Why:  As needed call my cell phone [phone]   Contact information:   [address] [place] [address] [phone]        The results of significant diagnostics from this hospitalization (including imaging, microbiology, ancillary and laboratory) are listed below for reference.     Microbiology: Recent Results (from the past 240 hour(s))  Surgical pcr screen     Status: Abnormal   Collection Time: 02/17/16  5:30 AM  Result Value Ref Range Status   MRSA, PCR NEGATIVE NEGATIVE Final   Staphylococcus aureus POSITIVE (A) NEGATIVE Final    Comment:        The Xpert SA Assay (FDA approved for NASAL specimens in patients over 65 years of age), is one component of a comprehensive surveillance program.  Test performance has been validated by [facility] for patients greater than or equal to 13 year old. It is not intended to diagnose infection nor to guide or monitor treatment.      Labs: Basic Metabolic Panel:  Recent Labs Lab 02/16/16 0532 02/17/16 [id number]  02/18/16 0234 02/19/16 [id number] 02/20/16 0232 02/21/16 0229 02/22/16 0251  NA 135 133*  --  133* 134* 133* 132* 134*  K 4.2 4.3  --  4.4 4.3 4.2 4.3 4.3  CL 108 107  --  108 106 106 107 107  CO2 20* 21*  --  21* 19* 20* 20* 19*  GLUCOSE 130* 118*  --  114* 122* 111* 105* 103*  BUN 38* 32*  --  28* 30* 26* 28* 26*  CREATININE 1.60* 1.55*  < > 1.53* 1.50* 1.58* 1.55* 1.53*  CALCIUM 8.4* 8.4*  --  8.2* 8.3* 8.4* 8.3* 8.7*  MG 2.2 2.1  --  2.0  --   --   --   --   PHOS  --  2.7  --  3.2  --   --  2.7  --   < > = values in this interval not  displayed. Liver Function Tests:  Recent Labs Lab 02/17/16 0337 02/18/16 0234 02/19/16 0412 02/21/16 0229  AST 64*  --  52*  --   ALT 41  --  21  --   ALKPHOS 60  --  63  --   BILITOT 1.6*  --  1.0  --   PROT 5.9*  --  5.9*  --   ALBUMIN 2.2*  2.3* 2.3* 2.1* 2.3*   CBC:  Recent Labs Lab 02/16/16 0532 02/17/16 0337 02/17/16 1903 02/21/16 0229 02/22/16 0251  WBC 15.4* 14.3* 9.8 9.3 8.6  HGB 9.2* 9.2* 10.0* 8.7* 9.4*  HCT 28.9* 29.0* 31.6* 27.4* 29.5*  MCV 94.4 94.5 93.8 92.3 92.8  PLT 222 218 233 305 307   Cardiac Enzymes:  Recent Labs Lab 02/16/16 0532 02/20/16 0232  CKTOTAL 776* 268   BNP (last 3 results)  Recent Labs  01/31/16 2206 02/05/16 1522  BNP 406.8* 804.7*   CBG:  Recent Labs Lab 02/21/16 1120 02/21/16 1617 02/21/16 2100 02/22/16 0606 02/22/16 1212  GLUCAP 139* 100* 123* 102* 140*   SIGNED: Time coordinating discharge: 30 minutes  MAGICK-MYERS, ISKRA, MD  [facility] 02/22/2016, 1:37 PM Pager [phone]  If 7PM-7AM, please contact night-coverage [email or web address] Password [email or web address]

## 2016-02-22 NOTE — Progress Notes (Signed)
I have insurance approval and MD clearance from Dr. Doyle Askew to admit pt. To IP rehab today.  Pt. Is agreeable.  I will update RNCM, CSW and RN of the plan.  Please call if questions.  [facility] Admissions Coordinator Cell [phone] Office [phone]

## 2016-02-22 NOTE — Care Management Note (Signed)
Case Management Note  Patient Details  Name: Eric [name] MRN: [id number] Date of Birth: 1951/05/19  Subjective/Objective:       Cardiac arrest - VT/Vfib             Action/Plan: Discharge Planning: AVS reviewed:  Chart reviewed. Pt scheduled to dc to IP rehab today.   PCP - Dr Glendale Chard Expected Discharge Date:  02/22/2016               Expected Discharge Plan:  [place]  In-House Referral:  NA  Discharge planning Services  CM Consult  Post Acute Care Choice:  NA Choice offered to:  NA  DME Arranged:  N/A DME Agency:  NA  HH Arranged:  NA HH Agency:  NA  Status of Service:  Completed, signed off  Medicare Important Message Given:    Date Medicare IM Given:    Medicare IM give by:    Date Additional Medicare IM Given:    Additional Medicare Important Message give by:     If discussed at [place] of Stay Meetings, dates discussed:    Additional Comments:  Erenest Rasher, RN 02/22/2016, 12:14 PM

## 2016-02-22 NOTE — Progress Notes (Signed)
Inpatient Rehabilitation  I have initiated insurance authorization process and await a BC[employer] decision.. Will provide update as soon as I hear from [facility].    [place] Admissions Coordinator Cell [phone] Office [phone]

## 2016-02-22 NOTE — H&P (View-Only) (Signed)
Physical Medicine and Rehabilitation Admission H&P    Chief Complaint  Patient presents with  . Cardiac Arrest  : HPI: Eric [name] is a 65 y.o. right handed male with history of hypertension, chronic renal insufficiency baseline creatinine 1.1-9.1, systolic congestive heart failure, atrial fibrillation on eliquis. Patient lives with wife. Independent prior to admission. 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 Presented from outside hospital 01/31/2016 with cardiac arrest while at [facility]. Wife initiated CPR calling EMS. EMS arrived within 5-10 minutes received 2 defibrillations. Troponin 0. 11. Lactic acid 1.2. Patient was intubated for airway protection and extubated 02/06/2016. Echocardiogram ejection fraction 35% diffuse hypokinesis with grade 2 diastolic dysfunction. Initially placed on intravenous amiodarone discontinued due to bradycardia and later resumed. TEE completed with moderate to severe global reduction in LV function. No evidence of vegetation. Systolic function moderately to severely reduced.  Renal services Was consulted for creatinine 2.87-3.15 from baseline 1.8. Renal ultrasound without obstruction.Renal function has steadily improved to creatinine 1.53. Underwent cardiac catheterization after renal function stabilized 02/17/2016 s/p CRT-D per Dr. Osie Cheeks cardiology services. Presently on subcutaneous heparin for DVT prophylaxis. Diet has been advanced to a regular consistency. Acute on chronic anemia 9.4 and monitored. Additionally, the patient was noted to have bilateral lower extremity weakness. An MRI, then CT of the lumbar spine and abdomen/pelvis revealed a retroperitoneal hematoma. It was felt that weakness was due to femoral nerve compression although imaging is also notable for hemorrhage around both psoas and iliacus muscles. Physical therapy evaluation has been completed with recommendations of physical medicine rehabilitation  consult. Patient was admitted for comprehensive rehabilitation program  ROS Constitutional: Negative for fever and chills.  HENT: Negative for hearing loss.  Eyes: Negative for blurred vision and double vision.  Respiratory: Negative for cough.   Exertional dyspnea  Cardiovascular: Positive for palpitations and leg swelling.  Gastrointestinal: Positive for constipation. Negative for nausea and vomiting.  Genitourinary: Positive for urgency. Negative for dysuria and hematuria.  Musculoskeletal: Positive for back pain.  Neurological: Positive for headaches. Negative for weakness.  All other systems reviewed and are negative   Past Medical History  Diagnosis Date  . Claustrophobia   . Heart murmur   . Hypertension   . Varicose vein of leg     right  . Dysrhythmia     "palpitations"  . Migraine 02/13/12    "opthalmic"  . Chronic lower back pain   . Exertional dyspnea 01/2012  . CHF (congestive heart failure) ([place])   . Hypothyroidism   . Chronic kidney disease     kidney fx studies increased    Past Surgical History  Procedure Laterality Date  . Skin melanocytoma excision  2012    "above left clavicle"  . Finger surgery  2012    "4th digit right hand; thumb on left hand"  . Tee without cardioversion  03/22/2012    Procedure: TRANSESOPHAGEAL ECHOCARDIOGRAM (TEE);  Surgeon: Candee Furbish, MD;  Location: [facility] ENDOSCOPY;  Service: Cardiovascular;  Laterality: N/A;  . Cardioversion  03/22/2012    Procedure: CARDIOVERSION;  Surgeon: Candee Furbish, MD;  Location: [place];  Service: Cardiovascular;  Laterality: N/A;  . Cardioversion  04/19/2012    Procedure: CARDIOVERSION;  Surgeon: Sinclair Grooms, MD;  Location: [place];  Service: Cardiovascular;  Laterality: N/A;  . Colonoscopy N/A 04/11/2013    Procedure: COLONOSCOPY;  Surgeon: Inda Castle, MD;  Location: [facility] ENDOSCOPY;  Service: Endoscopy;  Laterality: N/A;  . Colonoscopy  N/A 04/11/2013    Procedure: COLONOSCOPY;  Surgeon:  Inda Castle, MD;  Location: [facility] ENDOSCOPY;  Service: Endoscopy;  Laterality: N/A;  . Tee without cardioversion N/A 02/08/2016    Procedure: TRANSESOPHAGEAL ECHOCARDIOGRAM (TEE);  Surgeon: Lelon Perla, MD;  Location: [facility] ENDOSCOPY;  Service: Cardiovascular;  Laterality: N/A;  . Radiology with anesthesia N/A 02/11/2016    Procedure: MRI OF THE BRAIN WITHOUT CONTRAST, LUMBAR WITHOUT CONTRAST;  Surgeon: Medication Radiologist, MD;  Location: [place];  Service: Radiology;  Laterality: N/A;  DR. WOOD/MRI  . Cardiac catheterization N/A 02/17/2016    Procedure: Left Heart Cath and Coronary Angiography;  Surgeon: Jettie Booze, MD;  Location: [place] CV LAB;  Service: Cardiovascular;  Laterality: N/A;  . Ep implantable device N/A 02/17/2016    Procedure: BiV ICD Insertion CRT-D;  Surgeon: Evans Lance, MD;  Location: [place] CV LAB;  Service: Cardiovascular;  Laterality: N/A;   Family History  Problem Relation Age of Onset  . Hypertension Mother   . Heart disease Mother   . Hypertension Father   . Heart disease Father   . Heart failure Mother   . Diabetes Mother   . Heart failure Father   . Heart failure Mother    Social History:  reports that he has never smoked. He has never used smokeless tobacco. He reports that he drinks alcohol. He reports that he does not use illicit drugs. Allergies:  Allergies  Allergen Reactions  . Lactose Intolerance (Gi) Diarrhea   Medications Prior to Admission  Medication Sig Dispense Refill  . Ascorbic Acid (VITAMIN C WITH ROSE HIPS) 1000 MG tablet Take 1,000 mg by mouth daily.    Marland Kitchen atorvastatin (LIPITOR) 20 MG tablet TAKE 1 TABLET BY MOUTH DAILY 90 tablet 0  . b complex vitamins tablet Take 1 tablet by mouth daily as needed (cold/ energy).     . carvedilol (COREG) 25 MG tablet TAKE 1 TABLET BY MOUTH TWICE DAILY 60 tablet 11  . Cholecalciferol 2000 units CAPS Take 2,000 Units by mouth 3 (three) times a week.    . Coenzyme Q10 (COQ10) 200 MG  CAPS Take 200 mg by mouth daily.    Marland Kitchen ELIQUIS 5 MG TABS tablet TAKE 1 TABLET BY MOUTH TWICE DAILY 60 tablet 11  . fluticasone (FLONASE) 50 MCG/ACT nasal spray Place 2 sprays into the nose as needed for allergies.     . furosemide (LASIX) 40 MG tablet TAKE 1 TABLET BY MOUTH DAILY 90 tablet 0  . isosorbide-hydrALAZINE (BIDIL) 20-37.5 MG per tablet Take 1 tablet by mouth 2 (two) times daily.    Marland Kitchen levothyroxine (SYNTHROID, LEVOTHROID) 75 MCG tablet Take 1 tablet (75 mcg total) by mouth daily before breakfast. 30 tablet 11  . magnesium oxide (MAG-OX) 400 MG tablet Take 400 mg by mouth daily.    Marland Kitchen spironolactone (ALDACTONE) 25 MG tablet TAKE 1 TABLET BY MOUTH EVERY DAY 30 tablet 11    Home: Home Living Family/patient expects to be discharged to:: Inpatient rehab Living Arrangements: Spouse/significant other Available Help at Discharge: Other (Comment) (wife reports she will arrange full time (S) while she works) Type of Home: [employer] Access: Stairs to enter Technical brewer of Steps: 3 Entrance Stairs-Rails: None Home Layout: One level Bathroom Accessibility: Yes Home Equipment: None   Functional History: Prior Function Level of Independence: Independent Comments: 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 is his Environmental education officer   Functional Status:  Mobility: Bed Mobility Overal bed  mobility: Needs Assistance, +2 for physical assistance Bed Mobility: Supine to Sit Rolling: Min assist Sidelying to sit: Mod assist, +2 for physical assistance Supine to sit: Mod assist, +2 for physical assistance, HOB elevated Sit to supine: Mod assist, +2 for physical assistance General bed mobility comments: unable to roll to Lt due to new pacemaker; elevated HOB and assisted to turn to sit at EOB Transfers Overall transfer level: Needs assistance Equipment used: Ambulation equipment used, Rolling walker (2 wheeled) Transfer via Lift Equipment: Marketing executive Transfers: Sit to/from Stand,  Technical brewer Transfers Sit to Stand: +2 physical assistance, Max assist Stand pivot transfers: Total assist, +2 safety/equipment (sara-plus) General transfer comment: Practiced sit to partial stand x 3 - requires max A to lift hips off chair       ADL: ADL Overall ADL's : Needs assistance/impaired Eating/Feeding: Set up, Sitting Grooming: Wash/dry hands, Wash/dry face, Oral care, Brushing hair, Set up, Sitting Upper Body Bathing: Moderate assistance, Sitting Lower Body Bathing: Maximal assistance, Sit to/from stand Upper Body Dressing : Moderate assistance, Sitting Lower Body Dressing: Maximal assistance, Sitting/lateral leans Lower Body Dressing Details (indicate cue type and reason): Pt was able to don/doff socks while seated EOC today! Toilet Transfer: Stand-pivot, BSC, Maximal assistance Toilet Transfer Details (indicate cue type and reason): utilizing sara plus  Toileting- Clothing Manipulation and Hygiene: Total assistance, Sit to/from stand Functional mobility during ADLs: Maximal assistance General ADL Comments: Pt very motivated to improve  Cognition: Cognition Overall Cognitive Status: Within Functional Limits for tasks assessed Orientation Level: Oriented X4 Cognition Arousal/Alertness: Awake/alert Behavior During Therapy: WFL for tasks assessed/performed Overall Cognitive Status: Within Functional Limits for tasks assessed Area of Impairment: Attention, Following commands, Safety/judgement, Awareness, Problem solving, Memory Current Attention Level: Sustained Memory: Decreased short-term memory Following Commands: Follows one step commands inconsistently, Follows one step commands with increased time Safety/Judgement: Decreased awareness of safety, Decreased awareness of deficits Awareness: Intellectual Problem Solving: Slow processing, Difficulty sequencing, Requires verbal cues, Requires tactile cues, Decreased initiation General Comments: will benefit from further  cognitive assessment   Physical Exam: Blood pressure 129/71, pulse 87, temperature 99 F (37.2 C), temperature source Oral, resp. rate 18, height '5\' 9"'  (1.753 m), weight 107.094 kg (236 lb 1.6 oz), SpO2 99 %. Physical Exam  Constitutional: He appears well-developed and well-nourished.  HENT:  Head: Normocephalic and atraumatic.  Eyes: EOM are normal. Pupils are equal, round, and reactive to light.  Neck: Normal range of motion.  Cardiovascular: Normal rate.  Exam reveals no friction rub.   No murmur heard. Respiratory: No respiratory distress. He has no wheezes. He has no rales.  Musculoskeletal:  Pain along both lateral hips/low back with palpation and hip flexion.   Neurological:  Pt cognitively intact with normal insight and awarenes. I see no issues with memory. Answered biographical questions without hesitation. Mild sensory loss to LT along both antero-lateral thighs, left greater than right. Bilateral HF 2/5, KE 3/5, ADF/PF 4/5. DTR's 1+. UE strength nearly 4+ to 5/5.   Skin:  Notable for pacer/ICD site swelling, resolving bruises. Remains covered in opsite.     Results for orders placed or performed during the hospital encounter of 01/31/16 (from the past 48 hour(s))  Glucose, capillary     Status: Abnormal   Collection Time: 02/20/16 11:19 AM  Result Value Ref Range   Glucose-Capillary 145 (H) 65 - 99 mg/dL   Comment 1 Notify RN   Glucose, capillary     Status: Abnormal   Collection Time: 02/20/16  4:20 PM  Result Value Ref Range   Glucose-Capillary 132 (H) 65 - 99 mg/dL   Comment 1 Notify RN   Glucose, capillary     Status: Abnormal   Collection Time: 02/20/16  8:59 PM  Result Value Ref Range   Glucose-Capillary 148 (H) 65 - 99 mg/dL  CBC     Status: Abnormal   Collection Time: 02/21/16  2:29 AM  Result Value Ref Range   WBC 9.3 4.0 - 10.5 K/uL   RBC 2.97 (L) 4.22 - 5.81 MIL/uL   Hemoglobin 8.7 (L) 13.0 - 17.0 g/dL   HCT 27.4 (L) 39.0 - 52.0 %   MCV 92.3 78.0 -  100.0 fL   MCH 29.3 26.0 - 34.0 pg   MCHC 31.8 30.0 - 36.0 g/dL   RDW 15.0 11.5 - 15.5 %   Platelets 305 150 - 400 K/uL  Renal function panel     Status: Abnormal   Collection Time: 02/21/16  2:29 AM  Result Value Ref Range   Sodium 132 (L) 135 - 145 mmol/L   Potassium 4.3 3.5 - 5.1 mmol/L    Comment: SLIGHT HEMOLYSIS   Chloride 107 101 - 111 mmol/L   CO2 20 (L) 22 - 32 mmol/L   Glucose, Bld 105 (H) 65 - 99 mg/dL   BUN 28 (H) 6 - 20 mg/dL   Creatinine, Ser 1.55 (H) 0.61 - 1.24 mg/dL   Calcium 8.3 (L) 8.9 - 10.3 mg/dL   Phosphorus 2.7 2.5 - 4.6 mg/dL   Albumin 2.3 (L) 3.5 - 5.0 g/dL   GFR calc non Af Amer 46 (L) >60 mL/min   GFR calc Af Amer 53 (L) >60 mL/min    Comment: (NOTE) The eGFR has been calculated using the CKD EPI equation. This calculation has not been validated in all clinical situations. eGFR's persistently <60 mL/min signify possible Chronic Kidney Disease.    Anion gap 5 5 - 15  Glucose, capillary     Status: Abnormal   Collection Time: 02/21/16  6:01 AM  Result Value Ref Range   Glucose-Capillary 109 (H) 65 - 99 mg/dL  Glucose, capillary     Status: Abnormal   Collection Time: 02/21/16 11:20 AM  Result Value Ref Range   Glucose-Capillary 139 (H) 65 - 99 mg/dL  Glucose, capillary     Status: Abnormal   Collection Time: 02/21/16  4:17 PM  Result Value Ref Range   Glucose-Capillary 100 (H) 65 - 99 mg/dL  Glucose, capillary     Status: Abnormal   Collection Time: 02/21/16  9:00 PM  Result Value Ref Range   Glucose-Capillary 123 (H) 65 - 99 mg/dL  CBC     Status: Abnormal   Collection Time: 02/22/16  2:51 AM  Result Value Ref Range   WBC 8.6 4.0 - 10.5 K/uL   RBC 3.18 (L) 4.22 - 5.81 MIL/uL   Hemoglobin 9.4 (L) 13.0 - 17.0 g/dL   HCT 29.5 (L) 39.0 - 52.0 %   MCV 92.8 78.0 - 100.0 fL   MCH 29.6 26.0 - 34.0 pg   MCHC 31.9 30.0 - 36.0 g/dL   RDW 15.1 11.5 - 15.5 %   Platelets 307 150 - 400 K/uL  Basic metabolic panel     Status: Abnormal   Collection  Time: 02/22/16  2:51 AM  Result Value Ref Range   Sodium 134 (L) 135 - 145 mmol/L   Potassium 4.3 3.5 - 5.1 mmol/L   Chloride 107 101 - 111  mmol/L   CO2 19 (L) 22 - 32 mmol/L   Glucose, Bld 103 (H) 65 - 99 mg/dL   BUN 26 (H) 6 - 20 mg/dL   Creatinine, Ser 1.53 (H) 0.61 - 1.24 mg/dL   Calcium 8.7 (L) 8.9 - 10.3 mg/dL   GFR calc non Af Amer 46 (L) >60 mL/min   GFR calc Af Amer 54 (L) >60 mL/min    Comment: (NOTE) The eGFR has been calculated using the CKD EPI equation. This calculation has not been validated in all clinical situations. eGFR's persistently <60 mL/min signify possible Chronic Kidney Disease.    Anion gap 8 5 - 15  Glucose, capillary     Status: Abnormal   Collection Time: 02/22/16  6:06 AM  Result Value Ref Range   Glucose-Capillary 102 (H) 65 - 99 mg/dL   No results found.     Medical Problem List and Plan: 1.  Acute encephalopathy secondary to cardiac arrest/CPR status post CRT-D 02/17/2016  -admit to inpatient rehab today 2.  DVT Prophylaxis/Anticoagulation: Subcutaneous heparin. Monitor platelet counts and any signs of bleeding 3. Pain Management/chronic back pain: Oxycodone as needed 4. Hypertension/ischemic cardiomyopathy. Amiodarone 400 mg daily, Coreg 25 mg daily, Lasix 40 mg daily,BIDIL 20-30 7.5 mg tablet 0.5 3 times a day. Monitor with increased mobility 5. Neuropsych: This patient is capable of making decisions on his own behalf. 6. Skin/Wound Care: Routine skin checks 7. Fluids/Electrolytes/Nutrition: Routine I&O with follow-up chemistries 8. Chronic renal insufficiency. Baseline creatinine 1.80-2.00. Follow-up chemistries 9. Systolic congestive heart failure. Continue Lasix 40 mg daily. The patient daily 10. Hypothyroidism. Synthroid. TSH 2.861 11. Retroperitoneal hemorrhage with femoral nerve involvement likely affecting the iliacus muscle and leading to sensory loss along the antero-lateral thigh. Additionally, there is hematoma overlying the  psoas muscles.   -expect continued improvement  -conservative mgt for now   Post Admission Physician Evaluation: 1. Functional deficits secondary  to debility and encephalopathy after cardiac arrest/ multiple medical/retroperitoneal hemorrhage. 2. Patient is admitted to receive collaborative, interdisciplinary care between the physiatrist, rehab nursing staff, and therapy team. 3. Patient's level of medical complexity and substantial therapy needs in context of that medical necessity cannot be provided at a lesser intensity of care such as a SNF. 4. Patient has experienced substantial functional loss from his/her baseline which was documented above under the "Functional History" and "Functional Status" headings.  Judging by the patient's diagnosis, physical exam, and functional history, the patient has potential for functional progress which will result in measurable gains while on inpatient rehab.  These gains will be of substantial and practical use upon discharge  in facilitating mobility and self-care at the household level. 5. Physiatrist will provide 24 hour management of medical needs as well as oversight of the therapy plan/treatment and provide guidance as appropriate regarding the interaction of the two. 6. 24 hour rehab nursing will assist with bladder management, bowel management, safety, skin/wound care, disease management, medication administration, pain management and patient education  and help integrate therapy concepts, techniques,education, etc. 7. PT will assess and treat for/with: Lower extremity strength, range of motion, stamina, balance, functional mobility, safety, adaptive techniques and equipment, pain mgt, NMR, ego support.   Goals are: mod I. 8. OT will assess and treat for/with: ADL's, functional mobility, safety, upper extremity strength, adaptive techniques and equipment,NMR, pain mgt, community reintegration, education.   Goals are: mod I. Therapy may proceed with  showering this patient. 9. SLP will assess and treat for/with: n/a.  Goals are:  n/a. 10. Case Management and Social Worker will assess and treat for psychological issues and discharge planning. 11. Team conference will be held weekly to assess progress toward goals and to determine barriers to discharge. 12. Patient will receive at least 3 hours of therapy per day at least 5 days per week. 13. ELOS: 12-16 days       14. Prognosis:  excellent     Meredith Staggers, MD, [facility] 02/22/2016   02/22/2016

## 2016-02-22 NOTE — Progress Notes (Signed)
[name], MD Physician Signed Physical Medicine and Rehabilitation Consult Note 02/10/2016 11:02 AM  Related encounter: ED to Hosp-Admission (Current) from 01/31/2016 in [place] Collapse All        Physical Medicine and Rehabilitation Consult Reason for Consult: Acute cephalopathy related to cardiac arrest Referring Physician: [facility]    HPI: Eric [name] is a 65 y.o. right handed male with history of hypertension, chronic renal insufficiency baseline creatinine 123456, systolic congestive heart failure, atrial fibrillation on eliquis. Patient lives with wife. Independent prior to admission. 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 Presented from outside hospital 01/31/2016 with cardiac arrest while at [facility]. Wife initiated CPR calling EMS. EMS arrived within 5-10 minutes received 2 defibrillations. Troponin 0. 11. Lactic acid 1.2. Patient was intubated. Echocardiogram ejection fraction 35% diffuse hypokinesis with grade 2 diastolic dysfunction. Initially placed on intravenous amiodarone discontinued due to bradycardia and later resumed. TEE completed with moderate to severe global reduction in LV function. No evidence of vegetation. Systolic function moderately to severely reduced. Plan for cardiac catheterization/ICD however delayed due to elevated creatinine 2.87-3.15. Renal services has been consulted. Renal ultrasound without obstruction. Presently maintained on intravenous heparin with Elliquis on hold. Mechanical soft nectar thick liquid diet. Physical therapy evaluation has been completed with recommendations of physical medicine rehabilitation consult.  Patient states that he was ambulating with some assistance a couple days ago but starting after his modified barium swallow test he noticed some increased weakness of the lower extremities. This was also noted by physical therapy  Patient has MRI  of the brain as well as MRI of the lumbar spine to further evaluate. This will be done under anesthesia due to claustrophobia Review of Systems  Constitutional: Negative for fever and chills.  HENT: Negative for hearing loss.  Eyes: Negative for blurred vision and double vision.  Respiratory: Negative for cough.   Exertional dyspnea  Cardiovascular: Positive for palpitations and leg swelling.  Gastrointestinal: Positive for constipation. Negative for nausea and vomiting.  Genitourinary: Positive for urgency. Negative for dysuria and hematuria.  Musculoskeletal: Positive for back pain.  Neurological: Positive for headaches. Negative for weakness.  All other systems reviewed and are negative.  Past Medical History  Diagnosis Date  . Claustrophobia   . Heart murmur   . Hypertension   . Varicose vein of leg     right  . Dysrhythmia     "palpitations"  . Migraine 02/13/12    "opthalmic"  . Chronic lower back pain   . Exertional dyspnea 01/2012  . CHF (congestive heart failure) ([place])   . Hypothyroidism   . Chronic kidney disease     kidney fx studies increased    Past Surgical History  Procedure Laterality Date  . Skin melanocytoma excision  2012    "above left clavicle"  . Finger surgery  2012    "4th digit right hand; thumb on left hand"  . Tee without cardioversion  03/22/2012    Procedure: TRANSESOPHAGEAL ECHOCARDIOGRAM (TEE); Surgeon: Candee Furbish, MD; Location: [facility] ENDOSCOPY; Service: Cardiovascular; Laterality: N/A;  . Cardioversion  03/22/2012    Procedure: CARDIOVERSION; Surgeon: Candee Furbish, MD; Location: [place]; Service: Cardiovascular; Laterality: N/A;  . Cardioversion  04/19/2012    Procedure: CARDIOVERSION; Surgeon: Sinclair Grooms, MD; Location: [place]; Service: Cardiovascular; Laterality: N/A;  . Colonoscopy N/A 04/11/2013    Procedure: COLONOSCOPY;  Surgeon: Inda Castle, MD; Location: [facility] ENDOSCOPY;  Service: Endoscopy; Laterality: N/A;  . Colonoscopy N/A 04/11/2013    Procedure: COLONOSCOPY; Surgeon: Inda Castle, MD; Location: [facility] ENDOSCOPY; Service: Endoscopy; Laterality: N/A;  . Tee without cardioversion N/A 02/08/2016    Procedure: TRANSESOPHAGEAL ECHOCARDIOGRAM (TEE); Surgeon: Lelon Perla, MD; Location: [facility] ENDOSCOPY; Service: Cardiovascular; Laterality: N/A;   Family History  Problem Relation Age of Onset  . Hypertension Mother   . Heart disease Mother   . Hypertension Father   . Heart disease Father   . Heart failure Mother   . Diabetes Mother   . Heart failure Father   . Heart failure Mother    Social History:  reports that he has never smoked. He has never used smokeless tobacco. He reports that he drinks alcohol. He reports that he does not use illicit drugs. Allergies:  Allergies  Allergen Reactions  . Lactose Intolerance (Gi) Diarrhea   Medications Prior to Admission  Medication Sig Dispense Refill  . Ascorbic Acid (VITAMIN C WITH ROSE HIPS) 1000 MG tablet Take 1,000 mg by mouth daily.    Marland Kitchen atorvastatin (LIPITOR) 20 MG tablet TAKE 1 TABLET BY MOUTH DAILY 90 tablet 0  . b complex vitamins tablet Take 1 tablet by mouth daily as needed (cold/ energy).     . carvedilol (COREG) 25 MG tablet TAKE 1 TABLET BY MOUTH TWICE DAILY 60 tablet 11  . Cholecalciferol 2000 units CAPS Take 2,000 Units by mouth 3 (three) times a week.    . Coenzyme Q10 (COQ10) 200 MG CAPS Take 200 mg by mouth daily.    Marland Kitchen ELIQUIS 5 MG TABS tablet TAKE 1 TABLET BY MOUTH TWICE DAILY 60 tablet 11  . fluticasone (FLONASE) 50 MCG/ACT nasal spray Place 2 sprays into the nose as needed for allergies.     . furosemide (LASIX) 40 MG tablet TAKE 1 TABLET BY MOUTH DAILY 90 tablet 0  . isosorbide-hydrALAZINE (BIDIL) 20-37.5 MG per tablet  Take 1 tablet by mouth 2 (two) times daily.    Marland Kitchen levothyroxine (SYNTHROID, LEVOTHROID) 75 MCG tablet Take 1 tablet (75 mcg total) by mouth daily before breakfast. 30 tablet 11  . magnesium oxide (MAG-OX) 400 MG tablet Take 400 mg by mouth daily.    Marland Kitchen spironolactone (ALDACTONE) 25 MG tablet TAKE 1 TABLET BY MOUTH EVERY DAY 30 tablet 11    Home: Home Living Family/patient expects to be discharged to:: Private residence Living Arrangements: Spouse/significant other Available Help at Discharge: Family, Available 24 hours/day Type of Home: House Home Access: Stairs to enter [employer] of Steps: 3 Entrance Stairs-Rails: None Home Layout: One level Bathroom Accessibility: Yes Home Equipment: None  Functional History: Prior Function Level of Independence: Independent Comments: Pt worked Music therapist.  Functional Status:  Mobility: Bed Mobility Overal bed mobility: Needs Assistance Bed Mobility: Rolling Rolling: Min assist Supine to sit: Mod assist, +2 for physical assistance General bed mobility comments: Pt reaches for bed rail to pull himself to roll, assist needed. Transfers Overall transfer level: Needs assistance Equipment used: Rolling walker (2 wheeled) Transfer via Lift Equipment: Maxisky Transfers: Sit to/from Stand Sit to Stand: +2 physical assistance, Total assist, +2 safety/equipment Stand pivot transfers: Mod assist, +2 physical assistance General transfer comment: Unable to achieve standing. Pt required min assist to scoot to edge of seat. Bed pad and gait belt used to attempt sit>stand; however, pt's LEs buckle completely and pt unable to assist aside from UEs. Pt required total assist to return to chair. Maxisky utilized to assist pt back to  bed.      ADL:    Cognition: Cognition Overall Cognitive Status: Impaired/Different from baseline Orientation Level: Oriented X4 Cognition Arousal/Alertness: Awake/alert Behavior During  Therapy: Anxious Overall Cognitive Status: Impaired/Different from baseline Area of Impairment: Attention, Following commands, Safety/judgement, Awareness, Problem solving, Memory Current Attention Level: Sustained Memory: Decreased short-term memory Following Commands: Follows one step commands inconsistently, Follows one step commands with increased time Safety/Judgement: Decreased awareness of safety, Decreased awareness of deficits Awareness: Intellectual Problem Solving: Slow processing, Difficulty sequencing, Requires verbal cues, Requires tactile cues, Decreased initiation General Comments: Delayed response time and had to constantly cue for maintaining posture and movment.   Blood pressure 137/76, pulse 97, temperature 98.2 F (36.8 C), temperature source Axillary, resp. rate 17, height 5\' 9"  (1.753 m), weight 107.8 kg (237 lb 10.5 oz), SpO2 100 %. Physical Exam  Constitutional:  Patient is sitting up in chair. Appears to be somewhat short of breath.  HENT:  Head: Normocephalic.  Eyes: EOM are normal.  Neck: Normal range of motion. Neck supple. No thyromegaly present.  Cardiovascular:  Cardiac rate controlled  Respiratory:  Decreased breath sounds at the bases  GI: Soft. Bowel sounds are normal. He exhibits no distension.  Neurological:  Mood is flat. Makes eye contact with examiner. Followed simple commands. He was able to provide his date of birth with delay in processing. Appropriate for present year and president of the [place]. Fair medical historian  Skin: Skin is warm and dry.  Motor strength is 5/5 in bilateral deltoid bicep tricep and grip2 minus at the hip flexors knee extensors ankle dorsiflexor and plantar flexor Sensation is intact to light touch and proprioception in bilateral upper and lower limbs Responses are mildly delayed, He remembers 3/3 objects after 5 minute delay   Lab Results Last 24 Hours    Results for orders placed or performed during  the hospital encounter of 01/31/16 (from the past 24 hour(s))  CK Status: Abnormal   Collection Time: 02/09/16 11:22 AM  Result Value Ref Range   Total CK 3457 (H) 49 - 397 U/L  Glucose, capillary Status: Abnormal   Collection Time: 02/09/16 11:47 AM  Result Value Ref Range   Glucose-Capillary 167 (H) 65 - 99 mg/dL   Comment 1 Capillary Specimen   Glucose, capillary Status: Abnormal   Collection Time: 02/09/16 4:45 PM  Result Value Ref Range   Glucose-Capillary 156 (H) 65 - 99 mg/dL  Glucose, capillary Status: None   Collection Time: 02/09/16 8:01 PM  Result Value Ref Range   Glucose-Capillary 97 65 - 99 mg/dL  Glucose, capillary Status: Abnormal   Collection Time: 02/10/16 12:38 AM  Result Value Ref Range   Glucose-Capillary 132 (H) 65 - 99 mg/dL  Glucose, capillary Status: Abnormal   Collection Time: 02/10/16 12:38 AM  Result Value Ref Range   Glucose-Capillary 134 (H) 65 - 99 mg/dL   Comment 1 Capillary Specimen   CBC Status: Abnormal   Collection Time: 02/10/16 2:50 AM  Result Value Ref Range   WBC 17.8 (H) 4.0 - 10.5 K/uL   RBC 2.88 (L) 4.22 - 5.81 MIL/uL   Hemoglobin 8.8 (L) 13.0 - 17.0 g/dL   HCT 27.2 (L) 39.0 - 52.0 %   MCV 94.4 78.0 - 100.0 fL   MCH 30.6 26.0 - 34.0 pg   MCHC 32.4 30.0 - 36.0 g/dL   RDW 14.4 11.5 - 15.5 %   Platelets 257 150 - 400 K/uL  Heparin level (unfractionated) Status: None  Collection Time: 02/10/16 2:50 AM  Result Value Ref Range   Heparin Unfractionated 0.53 0.30 - 0.70 IU/mL  Basic metabolic panel Status: Abnormal   Collection Time: 02/10/16 2:50 AM  Result Value Ref Range   Sodium 144 135 - 145 mmol/L   Potassium 4.2 3.5 - 5.1 mmol/L   Chloride 109 101 - 111 mmol/L   CO2 21 (L) 22 - 32 mmol/L   Glucose, Bld 121 (H) 65 - 99 mg/dL   BUN 98 (H)  6 - 20 mg/dL   Creatinine, Ser 3.15 (H) 0.61 - 1.24 mg/dL   Calcium 8.6 (L) 8.9 - 10.3 mg/dL   GFR calc non Af Amer 19 (L) >60 mL/min   GFR calc Af Amer 22 (L) >60 mL/min   Anion gap 14 5 - 15  Magnesium Status: Abnormal   Collection Time: 02/10/16 2:50 AM  Result Value Ref Range   Magnesium 2.8 (H) 1.7 - 2.4 mg/dL  Phosphorus Status: Abnormal   Collection Time: 02/10/16 2:50 AM  Result Value Ref Range   Phosphorus 5.5 (H) 2.5 - 4.6 mg/dL  Glucose, capillary Status: Abnormal   Collection Time: 02/10/16 4:03 AM  Result Value Ref Range   Glucose-Capillary 113 (H) 65 - 99 mg/dL  Glucose, capillary Status: Abnormal   Collection Time: 02/10/16 8:24 AM  Result Value Ref Range   Glucose-Capillary 134 (H) 65 - 99 mg/dL   Comment 1 Capillary Specimen   Hepatic function panel Status: Abnormal   Collection Time: 02/10/16 8:27 AM  Result Value Ref Range   Total Protein 7.6 6.5 - 8.1 g/dL   Albumin 2.7 (L) 3.5 - 5.0 g/dL   AST 164 (H) 15 - 41 U/L   ALT 56 17 - 63 U/L   Alkaline Phosphatase 42 38 - 126 U/L   Total Bilirubin 1.1 0.3 - 1.2 mg/dL   Bilirubin, Direct 0.2 0.1 - 0.5 mg/dL   Indirect Bilirubin 0.9 0.3 - 0.9 mg/dL  CK Status: Abnormal   Collection Time: 02/10/16 8:27 AM  Result Value Ref Range   Total CK 5788 (H) 49 - 397 U/L      Imaging Results (Last 48 hours)    US Renal  02/09/2016 CLINICAL DATA: Acute kidney injury. History of hypertension and chronic kidney disease. EXAM: RENAL / URINARY TRACT ULTRASOUND COMPLETE COMPARISON: None. FINDINGS: Right Kidney: Length: 11.4 cm. Diffusely increased parenchymal echogenicity. No mass or hydronephrosis visualized. Left Kidney: Length: 12.9 cm. Diffusely increased parenchymal echogenicity. No mass or hydronephrosis visualized. Bladder: Appears normal for degree of bladder distention.  IMPRESSION: Normal size kidneys with diffusely increased parenchymal echogenicity, consistent with medical renal disease. No evidence of hydronephrosis. Electronically Signed By: Earle Gell M.D. On: 02/09/2016 13:52   Dg Chest Port 1 View  02/10/2016 CLINICAL DATA: Shortness of breath and leukocytosis. EXAM: PORTABLE CHEST 1 VIEW COMPARISON: 02/07/2016 FINDINGS: Lungs are adequately inflated without consolidation or effusion. Mild stable cardiomegaly. Minimal degenerative change of the spine. IMPRESSION: No acute cardiopulmonary disease. Mild stable cardiomegaly. Electronically Signed By: Marin Olp M.D. On: 02/10/2016 09:54   Dg Swallowing Func-speech Pathology  02/09/2016 Objective Swallowing Evaluation: Type of Study: MBS-Modified Barium Swallow Study Patient Details Name: Obinna Francke MRN: [id number] Date of Birth: 08-10-1951 Today's Date: 02/09/2016 Time: SLP Start Time (ACUTE ONLY): 1340-SLP Stop Time (ACUTE ONLY): 1404 SLP Time Calculation (min) (ACUTE ONLY): 24 min Past Medical History: Past Medical History Diagnosis Date . Claustrophobia . Heart murmur . Hypertension . Varicose vein of leg right . Dysrhythmia "palpitations" .  Migraine 02/13/12 "opthalmic" . Chronic lower back pain . Exertional dyspnea 01/2012 . CHF (congestive heart failure) ([place]) . Hypothyroidism . Chronic kidney disease kidney fx studies increased Past Surgical History: Past Surgical History Procedure Laterality Date . Skin melanocytoma excision 2012 "above left clavicle" . Finger surgery 2012 "4th digit right hand; thumb on left hand" . Tee without cardioversion 03/22/2012 Procedure: TRANSESOPHAGEAL ECHOCARDIOGRAM (TEE); Surgeon: Candee Furbish, MD; Location: [facility] ENDOSCOPY; Service: Cardiovascular; Laterality: N/A; . Cardioversion 03/22/2012 Procedure: CARDIOVERSION; Surgeon: Candee Furbish, MD; Location: [place]; Service: Cardiovascular; Laterality: N/A; . Cardioversion 04/19/2012  Procedure: CARDIOVERSION; Surgeon: Sinclair Grooms, MD; Location: [place]; Service: Cardiovascular; Laterality: N/A; . Colonoscopy N/A 04/11/2013 Procedure: COLONOSCOPY; Surgeon: Inda Castle, MD; Location: [facility] ENDOSCOPY; Service: Endoscopy; Laterality: N/A; . Colonoscopy N/A 04/11/2013 Procedure: COLONOSCOPY; Surgeon: Inda Castle, MD; Location: [facility] ENDOSCOPY; Service: Endoscopy; Laterality: N/A; [name] with known sCHF and A.fib, presented 05/08 after out of hospital cardiac arrest. ROSC within 15-20 minutes after defib x 2. ETT 5/8-5/14. Now with acute encephalopathy with concern for anoxia per MD notes. Notable dysphagia s/p extubation. Subjective: pleasant, mildly confused Assessment / Plan / Recommendation CHL IP CLINICAL IMPRESSIONS 02/09/2016 Therapy Diagnosis Mild pharyngeal phase dysphagia Clinical Impression Pt presents with a mild pharyngeal dysphagia marked by trace, silent aspiration of thin liquids due to decreased laryngeal closure during the swallow. There is adequate mastication; swift swallow timing; good pharyngeal peristalsis. Decreased UES patency leads to slight backflow of materials from cervical esophagus into pyriforms - residue is limited and did not spill into airway. For now, recommend initiating a dysphagia 3 diet with nectar-thick liquids; continue meds whole in puree. SLP will follow for safety and diet advancement. Impact on safety and function Mild aspiration risk CHL IP TREATMENT RECOMMENDATION 02/09/2016 Treatment Recommendations Therapy as outlined in treatment plan below Prognosis 02/09/2016 Prognosis for Safe Diet Advancement Good Barriers to Reach Goals -- Barriers/Prognosis Comment -- CHL IP DIET RECOMMENDATION 02/09/2016 SLP Diet Recommendations Dysphagia 3 (Mech soft) solids;Nectar thick liquid Liquid Administration via Cup Medication Administration Whole meds with puree Compensations Slow rate;Small sips/bites Postural Changes Seated upright  at 90 degrees CHL IP OTHER RECOMMENDATIONS 02/09/2016 Recommended Consults -- Oral Care Recommendations Oral care BID Other Recommendations Order thickener from pharmacy CHL IP FOLLOW UP RECOMMENDATIONS 02/09/2016 Follow up Recommendations (No Data) CHL IP FREQUENCY AND DURATION 02/09/2016 Speech Therapy Frequency (ACUTE ONLY) min 3x week Treatment Duration 2 weeks CHL IP ORAL PHASE 02/09/2016 Oral Phase WFL Oral - Pudding Teaspoon -- Oral - Pudding Cup -- Oral - Honey Teaspoon -- Oral - Honey Cup -- Oral - Nectar Teaspoon -- Oral - Nectar Cup -- Oral - Nectar Straw -- Oral - Thin Teaspoon -- Oral - Thin Cup -- Oral - Thin Straw -- Oral - Puree -- Oral - Mech Soft -- Oral - Regular -- Oral - Multi-Consistency -- Oral - Pill -- Oral Phase - Comment -- CHL IP PHARYNGEAL PHASE 02/09/2016 Pharyngeal Phase Impaired Pharyngeal- Pudding Teaspoon -- Pharyngeal -- Pharyngeal- Pudding Cup -- Pharyngeal -- Pharyngeal- Honey Teaspoon -- Pharyngeal -- Pharyngeal- Honey Cup -- Pharyngeal -- Pharyngeal- Nectar Teaspoon -- Pharyngeal -- Pharyngeal- Nectar Cup -- Pharyngeal -- Pharyngeal- Nectar Straw -- Pharyngeal -- Pharyngeal- Thin Teaspoon -- Pharyngeal -- Pharyngeal- Thin Cup Reduced airway/laryngeal closure;Trace aspiration;Penetration/Aspiration during swallow Pharyngeal Material enters airway, passes BELOW cords without attempt by patient to eject out (silent aspiration) Pharyngeal- Thin Straw -- Pharyngeal -- Pharyngeal- Puree -- Pharyngeal -- Pharyngeal- Mechanical Soft -- Pharyngeal -- Pharyngeal-  Regular -- Pharyngeal -- Pharyngeal- Multi-consistency -- Pharyngeal -- Pharyngeal- Pill -- Pharyngeal -- Pharyngeal Comment no pharyngeal residue post-swallow CHL IP CERVICAL ESOPHAGEAL PHASE 02/09/2016 Cervical Esophageal Phase Impaired Pudding Teaspoon -- Pudding Cup -- Honey Teaspoon -- Honey Cup -- Nectar Teaspoon -- Nectar Cup -- Nectar Straw -- Thin Teaspoon -- Thin Cup -- Thin Straw -- Puree -- Mechanical Soft --  Regular -- Multi-consistency -- Pill -- Cervical Esophageal Comment tight UES with minimal backflow noted into pyriforms No flowsheet data found. [name] 02/09/2016, 2:23 PM     Assessment/Plan: Diagnosis: Paraplegia of undetermined etiology, workup in progress 1. Does the need for close, 24 hr/day medical supervision in concert with the patient's rehab needs make it unreasonable for this patient to be served in a less intensive setting? Yes 2. Co-Morbidities requiring supervision/potential complications: Coronary artery disease, atrial fibrillation on anticoagulation, acute renal failure, mild anoxic encep 3. Due to bladder management, bowel management, safety, skin/wound care, disease management, medication administration, pain management and patient education, does the patient require 24 hr/day rehab nursing? Yes 4. Does the patient require coordinated care of a physician, rehab nurse, PT (1-2 hrs/day, 5 days/week), OT (1-2 hrs/day, 5 days/week) and SLP (.5-1 hrs/day, 5 days/week) to address physical and functional deficits in the context of the above medical diagnosis(es)? Yes Addressing deficits in the following areas: balance, endurance, locomotion, strength, transferring, bowel/bladder control, bathing, dressing, feeding, toileting, cognition and psychosocial support 5. Can the patient actively participate in an intensive therapy program of at least 3 hrs of therapy per day at least 5 days per week? Yes 6. The potential for patient to make measurable gains while on inpatient rehab is good 7. Anticipated functional outcomes upon discharge from inpatient rehab are supervision with PT, supervision with OT, modified independent with SLP. 8. Estimated rehab length of stay to reach the above functional goals is: 20-25d 9. Does the patient have adequate social supports and living environment to accommodate these discharge functional goals? Yes 10. Anticipated D/C setting:  Home 11. Anticipated post D/C treatments: [place] therapy 12. Overall Rehab/Functional Prognosis: good  RECOMMENDATIONS: This patient's condition is appropriate for continued rehabilitative care in the following setting: CIR Patient has agreed to participate in recommended program. Yes Note that insurance prior authorization may be required for reimbursement for recommended care.  Comment: Needs to complete workup of paraplegia. Also will need to establish treatment prior to CIR    02/10/2016

## 2016-02-22 NOTE — H&P (Signed)
Physical Medicine and Rehabilitation Admission H&P    Chief Complaint  Patient presents with  . Cardiac Arrest  : HPI: Eric [name] is a 65 y.o. right handed male with history of hypertension, chronic renal insufficiency baseline creatinine 1.2-8.7, systolic congestive heart failure, atrial fibrillation on eliquis. Patient lives with wife. Independent prior to admission. 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 Presented from outside hospital 01/31/2016 with cardiac arrest while at [facility]. Wife initiated CPR calling EMS. EMS arrived within 5-10 minutes received 2 defibrillations. Troponin 0. 11. Lactic acid 1.2. Patient was intubated for airway protection and extubated 02/06/2016. Echocardiogram ejection fraction 35% diffuse hypokinesis with grade 2 diastolic dysfunction. Initially placed on intravenous amiodarone discontinued due to bradycardia and later resumed. TEE completed with moderate to severe global reduction in LV function. No evidence of vegetation. Systolic function moderately to severely reduced.  Renal services Was consulted for creatinine 2.87-3.15 from baseline 1.8. Renal ultrasound without obstruction.Renal function has steadily improved to creatinine 1.53. Underwent cardiac catheterization after renal function stabilized 02/17/2016 s/p CRT-D per Dr. Osie Cheeks cardiology services. Presently on subcutaneous heparin for DVT prophylaxis. Diet has been advanced to a regular consistency. Acute on chronic anemia 9.4 and monitored. Additionally, the patient was noted to have bilateral lower extremity weakness. An MRI, then CT of the lumbar spine and abdomen/pelvis revealed a retroperitoneal hematoma. It was felt that weakness was due to femoral nerve compression although imaging is also notable for hemorrhage around both psoas and iliacus muscles. Physical therapy evaluation has been completed with recommendations of physical medicine rehabilitation  consult. Patient was admitted for comprehensive rehabilitation program  ROS Constitutional: Negative for fever and chills.  HENT: Negative for hearing loss.  Eyes: Negative for blurred vision and double vision.  Respiratory: Negative for cough.   Exertional dyspnea  Cardiovascular: Positive for palpitations and leg swelling.  Gastrointestinal: Positive for constipation. Negative for nausea and vomiting.  Genitourinary: Positive for urgency. Negative for dysuria and hematuria.  Musculoskeletal: Positive for back pain.  Neurological: Positive for headaches. Negative for weakness.  All other systems reviewed and are negative   Past Medical History  Diagnosis Date  . Claustrophobia   . Heart murmur   . Hypertension   . Varicose vein of leg     right  . Dysrhythmia     "palpitations"  . Migraine 02/13/12    "opthalmic"  . Chronic lower back pain   . Exertional dyspnea 01/2012  . CHF (congestive heart failure) ([place])   . Hypothyroidism   . Chronic kidney disease     kidney fx studies increased    Past Surgical History  Procedure Laterality Date  . Skin melanocytoma excision  2012    "above left clavicle"  . Finger surgery  2012    "4th digit right hand; thumb on left hand"  . Tee without cardioversion  03/22/2012    Procedure: TRANSESOPHAGEAL ECHOCARDIOGRAM (TEE);  Surgeon: Candee Furbish, MD;  Location: [facility] ENDOSCOPY;  Service: Cardiovascular;  Laterality: N/A;  . Cardioversion  03/22/2012    Procedure: CARDIOVERSION;  Surgeon: Candee Furbish, MD;  Location: [place];  Service: Cardiovascular;  Laterality: N/A;  . Cardioversion  04/19/2012    Procedure: CARDIOVERSION;  Surgeon: Sinclair Grooms, MD;  Location: [place];  Service: Cardiovascular;  Laterality: N/A;  . Colonoscopy N/A 04/11/2013    Procedure: COLONOSCOPY;  Surgeon: Inda Castle, MD;  Location: [facility] ENDOSCOPY;  Service: Endoscopy;  Laterality: N/A;  . Colonoscopy  N/A 04/11/2013    Procedure: COLONOSCOPY;  Surgeon:  Inda Castle, MD;  Location: [facility] ENDOSCOPY;  Service: Endoscopy;  Laterality: N/A;  . Tee without cardioversion N/A 02/08/2016    Procedure: TRANSESOPHAGEAL ECHOCARDIOGRAM (TEE);  Surgeon: Lelon Perla, MD;  Location: [facility] ENDOSCOPY;  Service: Cardiovascular;  Laterality: N/A;  . Radiology with anesthesia N/A 02/11/2016    Procedure: MRI OF THE BRAIN WITHOUT CONTRAST, LUMBAR WITHOUT CONTRAST;  Surgeon: Medication Radiologist, MD;  Location: [place];  Service: Radiology;  Laterality: N/A;  DR. WOOD/MRI  . Cardiac catheterization N/A 02/17/2016    Procedure: Left Heart Cath and Coronary Angiography;  Surgeon: Jettie Booze, MD;  Location: [place] CV LAB;  Service: Cardiovascular;  Laterality: N/A;  . Ep implantable device N/A 02/17/2016    Procedure: BiV ICD Insertion CRT-D;  Surgeon: Evans Lance, MD;  Location: [place] CV LAB;  Service: Cardiovascular;  Laterality: N/A;   Family History  Problem Relation Age of Onset  . Hypertension Mother   . Heart disease Mother   . Hypertension Father   . Heart disease Father   . Heart failure Mother   . Diabetes Mother   . Heart failure Father   . Heart failure Mother    Social History:  reports that he has never smoked. He has never used smokeless tobacco. He reports that he drinks alcohol. He reports that he does not use illicit drugs. Allergies:  Allergies  Allergen Reactions  . Lactose Intolerance (Gi) Diarrhea   Medications Prior to Admission  Medication Sig Dispense Refill  . Ascorbic Acid (VITAMIN C WITH ROSE HIPS) 1000 MG tablet Take 1,000 mg by mouth daily.    Marland Kitchen atorvastatin (LIPITOR) 20 MG tablet TAKE 1 TABLET BY MOUTH DAILY 90 tablet 0  . b complex vitamins tablet Take 1 tablet by mouth daily as needed (cold/ energy).     . carvedilol (COREG) 25 MG tablet TAKE 1 TABLET BY MOUTH TWICE DAILY 60 tablet 11  . Cholecalciferol 2000 units CAPS Take 2,000 Units by mouth 3 (three) times a week.    . Coenzyme Q10 (COQ10) 200 MG  CAPS Take 200 mg by mouth daily.    Marland Kitchen ELIQUIS 5 MG TABS tablet TAKE 1 TABLET BY MOUTH TWICE DAILY 60 tablet 11  . fluticasone (FLONASE) 50 MCG/ACT nasal spray Place 2 sprays into the nose as needed for allergies.     . furosemide (LASIX) 40 MG tablet TAKE 1 TABLET BY MOUTH DAILY 90 tablet 0  . isosorbide-hydrALAZINE (BIDIL) 20-37.5 MG per tablet Take 1 tablet by mouth 2 (two) times daily.    Marland Kitchen levothyroxine (SYNTHROID, LEVOTHROID) 75 MCG tablet Take 1 tablet (75 mcg total) by mouth daily before breakfast. 30 tablet 11  . magnesium oxide (MAG-OX) 400 MG tablet Take 400 mg by mouth daily.    Marland Kitchen spironolactone (ALDACTONE) 25 MG tablet TAKE 1 TABLET BY MOUTH EVERY DAY 30 tablet 11    Home: Home Living Family/patient expects to be discharged to:: Inpatient rehab Living Arrangements: Spouse/significant other Available Help at Discharge: Other (Comment) (wife reports she will arrange full time (S) while she works) Type of Home: [employer] Access: Stairs to enter Technical brewer of Steps: 3 Entrance Stairs-Rails: None Home Layout: One level Bathroom Accessibility: Yes Home Equipment: None   Functional History: Prior Function Level of Independence: Independent Comments: 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 is his Environmental education officer   Functional Status:  Mobility: Bed Mobility Overal bed  mobility: Needs Assistance, +2 for physical assistance Bed Mobility: Supine to Sit Rolling: Min assist Sidelying to sit: Mod assist, +2 for physical assistance Supine to sit: Mod assist, +2 for physical assistance, HOB elevated Sit to supine: Mod assist, +2 for physical assistance General bed mobility comments: unable to roll to Lt due to new pacemaker; elevated HOB and assisted to turn to sit at EOB Transfers Overall transfer level: Needs assistance Equipment used: Ambulation equipment used, Rolling walker (2 wheeled) Transfer via Lift Equipment: Marketing executive Transfers: Sit to/from Stand,  Technical brewer Transfers Sit to Stand: +2 physical assistance, Max assist Stand pivot transfers: Total assist, +2 safety/equipment (sara-plus) General transfer comment: Practiced sit to partial stand x 3 - requires max A to lift hips off chair       ADL: ADL Overall ADL's : Needs assistance/impaired Eating/Feeding: Set up, Sitting Grooming: Wash/dry hands, Wash/dry face, Oral care, Brushing hair, Set up, Sitting Upper Body Bathing: Moderate assistance, Sitting Lower Body Bathing: Maximal assistance, Sit to/from stand Upper Body Dressing : Moderate assistance, Sitting Lower Body Dressing: Maximal assistance, Sitting/lateral leans Lower Body Dressing Details (indicate cue type and reason): Pt was able to don/doff socks while seated EOC today! Toilet Transfer: Stand-pivot, BSC, Maximal assistance Toilet Transfer Details (indicate cue type and reason): utilizing sara plus  Toileting- Clothing Manipulation and Hygiene: Total assistance, Sit to/from stand Functional mobility during ADLs: Maximal assistance General ADL Comments: Pt very motivated to improve  Cognition: Cognition Overall Cognitive Status: Within Functional Limits for tasks assessed Orientation Level: Oriented X4 Cognition Arousal/Alertness: Awake/alert Behavior During Therapy: WFL for tasks assessed/performed Overall Cognitive Status: Within Functional Limits for tasks assessed Area of Impairment: Attention, Following commands, Safety/judgement, Awareness, Problem solving, Memory Current Attention Level: Sustained Memory: Decreased short-term memory Following Commands: Follows one step commands inconsistently, Follows one step commands with increased time Safety/Judgement: Decreased awareness of safety, Decreased awareness of deficits Awareness: Intellectual Problem Solving: Slow processing, Difficulty sequencing, Requires verbal cues, Requires tactile cues, Decreased initiation General Comments: will benefit from further  cognitive assessment   Physical Exam: Blood pressure 129/71, pulse 87, temperature 99 F (37.2 C), temperature source Oral, resp. rate 18, height '5\' 9"'  (1.753 m), weight 107.094 kg (236 lb 1.6 oz), SpO2 99 %. Physical Exam  Constitutional: He appears well-developed and well-nourished.  HENT:  Head: Normocephalic and atraumatic.  Eyes: EOM are normal. Pupils are equal, round, and reactive to light.  Neck: Normal range of motion.  Cardiovascular: Normal rate.  Exam reveals no friction rub.   No murmur heard. Respiratory: No respiratory distress. He has no wheezes. He has no rales.  Musculoskeletal:  Pain along both lateral hips/low back with palpation and hip flexion.   Neurological:  Pt cognitively intact with normal insight and awarenes. I see no issues with memory. Answered biographical questions without hesitation. Mild sensory loss to LT along both antero-lateral thighs, left greater than right. Bilateral HF 2/5, KE 3/5, ADF/PF 4/5. DTR's 1+. UE strength nearly 4+ to 5/5.   Skin:  Notable for pacer/ICD site swelling, resolving bruises. Remains covered in opsite.     Results for orders placed or performed during the hospital encounter of 01/31/16 (from the past 48 hour(s))  Glucose, capillary     Status: Abnormal   Collection Time: 02/20/16 11:19 AM  Result Value Ref Range   Glucose-Capillary 145 (H) 65 - 99 mg/dL   Comment 1 Notify RN   Glucose, capillary     Status: Abnormal   Collection Time: 02/20/16  4:20 PM  Result Value Ref Range   Glucose-Capillary 132 (H) 65 - 99 mg/dL   Comment 1 Notify RN   Glucose, capillary     Status: Abnormal   Collection Time: 02/20/16  8:59 PM  Result Value Ref Range   Glucose-Capillary 148 (H) 65 - 99 mg/dL  CBC     Status: Abnormal   Collection Time: 02/21/16  2:29 AM  Result Value Ref Range   WBC 9.3 4.0 - 10.5 K/uL   RBC 2.97 (L) 4.22 - 5.81 MIL/uL   Hemoglobin 8.7 (L) 13.0 - 17.0 g/dL   HCT 27.4 (L) 39.0 - 52.0 %   MCV 92.3 78.0 -  100.0 fL   MCH 29.3 26.0 - 34.0 pg   MCHC 31.8 30.0 - 36.0 g/dL   RDW 15.0 11.5 - 15.5 %   Platelets 305 150 - 400 K/uL  Renal function panel     Status: Abnormal   Collection Time: 02/21/16  2:29 AM  Result Value Ref Range   Sodium 132 (L) 135 - 145 mmol/L   Potassium 4.3 3.5 - 5.1 mmol/L    Comment: SLIGHT HEMOLYSIS   Chloride 107 101 - 111 mmol/L   CO2 20 (L) 22 - 32 mmol/L   Glucose, Bld 105 (H) 65 - 99 mg/dL   BUN 28 (H) 6 - 20 mg/dL   Creatinine, Ser 1.55 (H) 0.61 - 1.24 mg/dL   Calcium 8.3 (L) 8.9 - 10.3 mg/dL   Phosphorus 2.7 2.5 - 4.6 mg/dL   Albumin 2.3 (L) 3.5 - 5.0 g/dL   GFR calc non Af Amer 46 (L) >60 mL/min   GFR calc Af Amer 53 (L) >60 mL/min    Comment: (NOTE) The eGFR has been calculated using the CKD EPI equation. This calculation has not been validated in all clinical situations. eGFR's persistently <60 mL/min signify possible Chronic Kidney Disease.    Anion gap 5 5 - 15  Glucose, capillary     Status: Abnormal   Collection Time: 02/21/16  6:01 AM  Result Value Ref Range   Glucose-Capillary 109 (H) 65 - 99 mg/dL  Glucose, capillary     Status: Abnormal   Collection Time: 02/21/16 11:20 AM  Result Value Ref Range   Glucose-Capillary 139 (H) 65 - 99 mg/dL  Glucose, capillary     Status: Abnormal   Collection Time: 02/21/16  4:17 PM  Result Value Ref Range   Glucose-Capillary 100 (H) 65 - 99 mg/dL  Glucose, capillary     Status: Abnormal   Collection Time: 02/21/16  9:00 PM  Result Value Ref Range   Glucose-Capillary 123 (H) 65 - 99 mg/dL  CBC     Status: Abnormal   Collection Time: 02/22/16  2:51 AM  Result Value Ref Range   WBC 8.6 4.0 - 10.5 K/uL   RBC 3.18 (L) 4.22 - 5.81 MIL/uL   Hemoglobin 9.4 (L) 13.0 - 17.0 g/dL   HCT 29.5 (L) 39.0 - 52.0 %   MCV 92.8 78.0 - 100.0 fL   MCH 29.6 26.0 - 34.0 pg   MCHC 31.9 30.0 - 36.0 g/dL   RDW 15.1 11.5 - 15.5 %   Platelets 307 150 - 400 K/uL  Basic metabolic panel     Status: Abnormal   Collection  Time: 02/22/16  2:51 AM  Result Value Ref Range   Sodium 134 (L) 135 - 145 mmol/L   Potassium 4.3 3.5 - 5.1 mmol/L   Chloride 107 101 - 111  mmol/L   CO2 19 (L) 22 - 32 mmol/L   Glucose, Bld 103 (H) 65 - 99 mg/dL   BUN 26 (H) 6 - 20 mg/dL   Creatinine, Ser 1.53 (H) 0.61 - 1.24 mg/dL   Calcium 8.7 (L) 8.9 - 10.3 mg/dL   GFR calc non Af Amer 46 (L) >60 mL/min   GFR calc Af Amer 54 (L) >60 mL/min    Comment: (NOTE) The eGFR has been calculated using the CKD EPI equation. This calculation has not been validated in all clinical situations. eGFR's persistently <60 mL/min signify possible Chronic Kidney Disease.    Anion gap 8 5 - 15  Glucose, capillary     Status: Abnormal   Collection Time: 02/22/16  6:06 AM  Result Value Ref Range   Glucose-Capillary 102 (H) 65 - 99 mg/dL   No results found.     Medical Problem List and Plan: 1.  Acute encephalopathy secondary to cardiac arrest/CPR status post CRT-D 02/17/2016  -admit to inpatient rehab today 2.  DVT Prophylaxis/Anticoagulation: Subcutaneous heparin. Monitor platelet counts and any signs of bleeding 3. Pain Management/chronic back pain: Oxycodone as needed 4. Hypertension/ischemic cardiomyopathy. Amiodarone 400 mg daily, Coreg 25 mg daily, Lasix 40 mg daily,BIDIL 20-30 7.5 mg tablet 0.5 3 times a day. Monitor with increased mobility 5. Neuropsych: This patient is capable of making decisions on his own behalf. 6. Skin/Wound Care: Routine skin checks 7. Fluids/Electrolytes/Nutrition: Routine I&O with follow-up chemistries 8. Chronic renal insufficiency. Baseline creatinine 1.80-2.00. Follow-up chemistries 9. Systolic congestive heart failure. Continue Lasix 40 mg daily. The patient daily 10. Hypothyroidism. Synthroid. TSH 2.861 11. Retroperitoneal hemorrhage with femoral nerve involvement likely affecting the iliacus muscle and leading to sensory loss along the antero-lateral thigh. Additionally, there is hematoma overlying the  psoas muscles.   -expect continued improvement  -conservative mgt for now   Post Admission Physician Evaluation: 1. Functional deficits secondary  to debility and encephalopathy after cardiac arrest/ multiple medical/retroperitoneal hemorrhage. 2. Patient is admitted to receive collaborative, interdisciplinary care between the physiatrist, rehab nursing staff, and therapy team. 3. Patient's level of medical complexity and substantial therapy needs in context of that medical necessity cannot be provided at a lesser intensity of care such as a SNF. 4. Patient has experienced substantial functional loss from his/her baseline which was documented above under the "Functional History" and "Functional Status" headings.  Judging by the patient's diagnosis, physical exam, and functional history, the patient has potential for functional progress which will result in measurable gains while on inpatient rehab.  These gains will be of substantial and practical use upon discharge  in facilitating mobility and self-care at the household level. 5. Physiatrist will provide 24 hour management of medical needs as well as oversight of the therapy plan/treatment and provide guidance as appropriate regarding the interaction of the two. 6. 24 hour rehab nursing will assist with bladder management, bowel management, safety, skin/wound care, disease management, medication administration, pain management and patient education  and help integrate therapy concepts, techniques,education, etc. 7. PT will assess and treat for/with: Lower extremity strength, range of motion, stamina, balance, functional mobility, safety, adaptive techniques and equipment, pain mgt, NMR, ego support.   Goals are: mod I. 8. OT will assess and treat for/with: ADL's, functional mobility, safety, upper extremity strength, adaptive techniques and equipment,NMR, pain mgt, community reintegration, education.   Goals are: mod I. Therapy may proceed with  showering this patient. 9. SLP will assess and treat for/with: n/a.  Goals are:  n/a. 10. Case Management and Social Worker will assess and treat for psychological issues and discharge planning. 11. Team conference will be held weekly to assess progress toward goals and to determine barriers to discharge. 12. Patient will receive at least 3 hours of therapy per day at least 5 days per week. 13. ELOS: 12-16 days       14. Prognosis:  excellent     Meredith Staggers, MD, [facility] 02/22/2016   02/22/2016

## 2016-02-22 NOTE — Progress Notes (Signed)
Gerlean Ren Rehab Admission Coordinator Signed Physical Medicine and Rehabilitation PMR Pre-admission 02/21/2016 4:12 PM  Related encounter: ED to Hosp-Admission (Current) from 01/31/2016 in [place] Collapse All   PMR Admission Coordinator Pre-Admission Assessment  Patient: Eric [name] is an 65 y.o., male MRN: [id number] DOB: 06/22/1951 Height: _0  (175.3 cm) Weight: 107.004 kg (235 lb 14.4 oz)  Insurance Information HMO: PPO: PCP: IPA: 80/20: OTHER: [employer] Options PPO PRIMARY: [place] Policy#: [id number] Subscriber: self CM Name: Philis Fendt Phone#: [phone] ext [phone] Fax#: [phone] Pre-Cert#: [id number], approved 02/22/16 for 14 days with clinical updates due 03/06/16  Employer: [employer] Benefits: Phone #: [phone] Name: Jiles Garter. Date: 09/26/15 Deduct: $1250 Out of Pocket Max: $4350 Life Max: none CIR: $450 copay then 80%/20% until OOP max met SNF: 80%/20% Outpatient: $52 copay per visit Co-Pay:  Home Health: 80%/20% Co-Pay:  DME: 80% Co-Pay: 20% Providers: In network SECONDARY: Policy#: Subscriber:  CM Name: Phone#: Fax#:  Pre-Cert#: Employer:  Benefits: Phone #: Name:  Eff. 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 right handed male with history of hypertension, chronic renal insufficiency baseline creatinine 6.8-0.3, systolic congestive heart failure, atrial fibrillation on eliquis. Patient lives with wife. Independent prior to admission. 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 Presented from outside hospital 01/31/2016 with cardiac arrest while at [facility]. Wife initiated CPR calling EMS. EMS arrived within 5-10 minutes received 2 defibrillations. Troponin 0. 11. Lactic acid 1.2. Patient was intubated for airway protection and extubated 02/06/2016. Echocardiogram ejection fraction 35% diffuse hypokinesis with grade 2 diastolic dysfunction. Initially placed on intravenous amiodarone discontinued due to bradycardia and later resumed. TEE completed with moderate to severe global reduction in LV function. No evidence of vegetation. Systolic function moderately to severely reduced. Renal services Was consulted for creatinine 2.87-3.15 from baseline 1.8. Renal ultrasound without obstruction.Renal function has steadily improved to creatinine 1.53. Underwent cardiac catheterization after renal function stabilized 02/17/2016 s/p CRT-D per Dr. Osie Cheeks cardiology services. Presently on subcutaneous heparin for DVT prophylaxis. Diet has been advanced to a regular consistency. Acute on chronic anemia 9.4 and monitored. Physical therapy evaluation has been completed with recommendations of physical medicine rehabilitation consult. Patient was admitted for comprehensive rehabilitation program      Past Medical History  Past Medical History  Diagnosis Date  . Claustrophobia   . Heart murmur   . Hypertension   . Varicose vein of leg     right  . Dysrhythmia     "palpitations"   . Migraine 02/13/12    "opthalmic"  . Chronic lower back pain   . Exertional dyspnea 01/2012  . CHF (congestive heart failure) ([place])   . Hypothyroidism   . Chronic kidney disease     kidney fx studies increased     Family History  family history includes Diabetes in his mother; Heart disease in his father and mother; Heart failure in his father, mother, and mother; Hypertension  in his father and mother.  Prior Rehab/Hospitalizations:  Has the patient had major surgery during 100 days prior to admission? No  Current Medications   Current facility-administered medications:  . 0.9 % sodium chloride infusion, , Intravenous, Continuous, Collene Gobble, MD, Last Rate: 10 mL/hr at 02/11/16 1757 . acetaminophen (TYLENOL) tablet 325-650 mg, 325-650 mg, Oral, Q4H PRN, Evans Lance, MD, 650 mg at 02/21/16 0517 . amiodarone (PACERONE) tablet 400 mg, 400 mg, Oral, Daily, Peter M Martinique, MD, 400 mg at 02/21/16 1000 . bisacodyl (DULCOLAX) EC tablet 5 mg, 5 mg, Oral, Daily PRN, Marijean Heath, NP, 5 mg at 02/06/16 1506 . bisacodyl (DULCOLAX) suppository 10 mg, 10 mg, Rectal, Daily PRN, Marijean Heath, NP . carvedilol (COREG) tablet 25 mg, 25 mg, Oral, BID WC, Cherene Altes, MD, 25 mg at 02/21/16 5409 . docusate sodium (COLACE) capsule 100 mg, 100 mg, Oral, BID, Marijean Heath, NP, 100 mg at 02/21/16 1000 . furosemide (LASIX) tablet 40 mg, 40 mg, Oral, Daily, Elmarie Shiley, MD, 40 mg at 02/21/16 1000 . heparin injection 5,000 Units, 5,000 Units, Subcutaneous, Q8H, Evans Lance, MD, 5,000 Units at 02/21/16 1420 . insulin aspart (novoLOG) injection 0-5 Units, 0-5 Units, Subcutaneous, QHS, Dianne Dun, NP, 2 Units at 02/13/16 2210 . insulin aspart (novoLOG) injection 0-9 Units, 0-9 Units, Subcutaneous, TID WC, Dianne Dun, NP, 1 Units at 02/21/16 1213 . isosorbide-hydrALAZINE (BIDIL) 20-37.5 MG per tablet 0.5 tablet, 0.5  tablet, Oral, TID, Mihai Croitoru, MD, 0.5 tablet at 02/21/16 1000 . levothyroxine (SYNTHROID, LEVOTHROID) tablet 75 mcg, 75 mcg, Oral, QAC breakfast, Collene Gobble, MD, 75 mcg at 02/21/16 [phone] . metoprolol (LOPRESSOR) injection 5 mg, 5 mg, Intravenous, Q5 min PRN, Norman Herrlich, MD . morphine 2 MG/ML injection 1-2 mg, 1-2 mg, Intravenous, Q2H PRN, Marijean Heath, NP, 2 mg at 02/14/16 1645 . ondansetron (ZOFRAN) injection 4 mg, 4 mg, Intravenous, Q6H PRN, Evans Lance, MD . oxyCODONE-acetaminophen (PERCOCET/ROXICET) 5-325 MG per tablet 1 tablet, 1 tablet, Oral, Q6H PRN, Donita Brooks, NP, 1 tablet at 02/21/16 1122 . polyethylene glycol (MIRALAX / GLYCOLAX) packet 17 g, 17 g, Oral, Daily, Marijean Heath, NP, 17 g at 02/18/16 1051  Patients Current Diet: Diet heart healthy/carb modified Room service appropriate?: Yes; Fluid consistency:: Thin  Precautions / Restrictions Precautions Precautions: Fall, ICD/Pacemaker Precaution Comments: Therapist on 5/26 spoke with EP PA re: heavy use of LUE due to LE weakness. She emphasized to avoid movement at shoulder (flexion, abdct, reaching behind/internal rotation/extension) or stretching of upper chest are primary concerns to avoid. Agreed he could weightbear through LUE with elbow near/at his side.  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 None   Prior Functional Level Prior Function Level of Independence: Independent Comments: Pt works full time as an  Chief Strategy Officer. Wife is his Environmental education officer   Self Care: Did the patient need help bathing, dressing, using the toilet or eating? Independent  Indoor Mobility: Did the patient need assistance with walking from room to room (with or without device)?  Independent  Stairs: Did the patient need assistance with internal or external stairs (with or without device)? Independent  Functional Cognition: Did the patient need help planning regular tasks such as shopping or remembering to take medications? Independent  Current Functional Level Cognition  Overall Cognitive Status: Within Functional Limits for tasks assessed Current Attention Level: Sustained Orientation Level: Oriented X4 Following Commands: Follows one step commands inconsistently, Follows one step commands with increased time Safety/Judgement: Decreased awareness of safety, Decreased awareness of deficits General Comments: will benefit from further cognitive assessment    Extremity Assessment (includes Sensation/Coordination)  Upper Extremity Assessment: Generalized weakness, LUE deficits/detail LUE Deficits / Details: elbow distally. Lt shoulder NT due to ICD/pacemaker precautions   Lower Extremity Assessment: Generalized weakness    ADLs  Overall ADL's : Needs assistance/impaired Eating/Feeding: Set up, Sitting Grooming: Wash/dry hands, Wash/dry face, Oral care, Brushing hair, Set up, Sitting Upper Body Bathing: Moderate assistance, Sitting Lower Body Bathing: Maximal assistance, Sit to/from stand Upper Body Dressing : Moderate assistance, Sitting Lower Body Dressing: Maximal assistance, Sitting/lateral leans Lower Body Dressing Details (indicate cue type and reason): Pt was able to don/doff socks while seated EOC today! Toilet Transfer: Stand-pivot, BSC, Maximal assistance Toilet Transfer Details (indicate cue type and reason): utilizing sara plus  Toileting- Clothing Manipulation and Hygiene: Total  assistance, Sit to/from stand Functional mobility during ADLs: Maximal assistance General ADL Comments: Pt very motivated to improve    Mobility  Overal bed mobility: Needs Assistance, +2 for physical assistance Bed Mobility: Supine to Sit Rolling: Min assist Sidelying to sit: Mod assist, +2 for physical assistance Supine to sit: Mod assist, +2 for physical assistance, HOB elevated Sit to supine: Mod assist, +2 for physical assistance General bed mobility comments: unable to roll to Lt due to new pacemaker; elevated HOB and assisted to turn to sit at EOB    Transfers  Overall transfer level: Needs assistance Equipment used: Ambulation equipment used, Rolling walker (2 wheeled) Transfer via Lift Equipment: Marketing executive Transfers: Sit to/from Stand, Technical brewer Transfers Sit to Stand: +2 physical assistance, Max assist Stand pivot transfers: Total assist, +2 safety/equipment (sara-plus) General transfer comment: Practiced sit to partial stand x 3 - requires max A to lift hips off chair     Ambulation / Gait / Stairs / Wheelchair Mobility       Posture / Balance Dynamic Sitting Balance Sitting balance - Comments: initially needing UE support for balance, then able to sit without hands on bed Balance Overall balance assessment: Needs assistance Sitting-balance support: No upper extremity supported, Feet supported Sitting balance-Leahy Scale: Good Sitting balance - Comments: initially needing UE support for balance, then able to sit without hands on bed Standing balance support: Bilateral upper extremity supported Standing balance-Leahy Scale: Poor Standing balance comment: See transfer section    Special needs/care consideration BiPAP/CPAP no CPM no Continuous Drip IV no Dialysis no Days Life Vest no Oxygen no Special Bed no Trach Size no Wound Vac (area) no  Skin per nursing assessment, left flank blood blister and left upper  abdominal abrasions As well as left upper chest incision Bowel mgmt last BM 02/21/16 on bedpan, continent per pt.  Bladder mgmt: Continent, using urinal Diabetic mgmt Was diet controlled PTA, has been on insulin since admission     Previous Home Environment Living Arrangements: Spouse/significant other Available Help at Discharge: Other (Comment) (wife reports she will arrange full time (S) while she works) Type of Home: [place]: One level Home Access: Stairs  to enter Entrance Stairs-Rails: None Entrance Stairs-Number of Steps: 3 Bathroom Accessibility: Yes Home Care Services: No  Discharge Living Setting Plans for Discharge Living Setting: Patient's home Type of Home at Discharge: House Discharge Home Layout: One level Discharge Home Access: Stairs to enter Entrance Stairs-Rails: Right, Left (per pt. differs from PT eval) Entrance Stairs-Number of Steps: 3 Discharge Bathroom Shower/Tub: Tub/shower unit Discharge Bathroom Toilet: Standard Discharge Bathroom Accessibility: Yes How Accessible: Accessible via wheelchair Does the patient have any problems obtaining your medications?: No  Social/Family/Support Systems Patient Roles: Spouse, Parent (has an adult son in [place]) Anticipated Caregiver: wife Von Reczek is primary caregiver Anticipated Caregiver's Contact Information: [phone] cell Ability/Limitations of Caregiver: pt. reports his wife is an Therapist, sports and the Environmental education officer at his solo opthalamology practice Caregiver Availability: Evenings only Discharge Plan Discussed with Primary Caregiver: Yes Is Caregiver In Agreement with Plan?: Yes Does Caregiver/Family have Issues with Lodging/Transportation while Pt is in Rehab?: No   Goals/Additional Needs Patient/Family Goal for Rehab: supervision PT/OT; n/a SLP (pt. cleared by ST 02/14/16); I discussed with [name] that these goals will likely be achieved at a wheelchair level. He expressed  understanding. Expected length of stay: 20-25 days Cultural Considerations: pt. reports he is [employer], does not eat pork but has been managing his own dietary restrictions when ordering his meals from dietary Dietary Needs: heart healthy, carb modified, thin liquids Equipment Needs: TBA Pt/Family Agrees to Admission and willing to participate: Yes Program Orientation Provided & Reviewed with Pt/Caregiver Including Roles & Responsibilities: Yes   Decrease burden of Care through IP rehab admission: n/a   Possible need for SNF placement upon discharge: not anticipated   Patient Condition: This patient's medical and functional status has changed since the consult dated: 02/10/16 in which the Rehabilitation Physician determined and documented that the patient's condition is appropriate for intensive rehabilitative care in an inpatient rehabilitation facility. See "History of Present Illness" (above) for medical update. Functional changes are: Pt. Is participating in transfers with use of "Clarise Cruz Plus" and is requiring +2 max assist for trials of sit to stand at chairside. Pt. Was able to don and doff socks with supervision while seated at edge of chair . Patient's medical and functional status update has been discussed with the Rehabilitation physician and patient remains appropriate for inpatient rehabilitation. Will admit to inpatient rehab today.  Preadmission Screen Completed By: Gerlean Ren, 02/21/2016 4:12 PM ______________________________________________________________________  Discussed status with Dr. Naaman Plummer on 02/22/16 at 1230 and received telephone approval for admission today.  Admission Coordinator: Gerlean Ren, time 1230 Sudie Grumbling 02/22/16          Cosigned by: Meredith Staggers, MD at 02/22/2016 1:38 PM  Revision History     Date/Time User Provider Type Action   02/22/2016 1:38 PM Meredith Staggers, MD Physician Cosign   02/22/2016 12:40 PM  Gerlean Ren Rehab Admission Coordinator Sign

## 2016-02-23 ENCOUNTER — Inpatient Hospital Stay (HOSPITAL_COMMUNITY): Payer: BC Managed Care – PPO | Admitting: Occupational Therapy

## 2016-02-23 ENCOUNTER — Inpatient Hospital Stay (HOSPITAL_COMMUNITY): Payer: BC Managed Care – PPO | Admitting: Physical Therapy

## 2016-02-23 DIAGNOSIS — S7412XS Injury of femoral nerve at hip and thigh level, left leg, sequela: Secondary | ICD-10-CM

## 2016-02-23 DIAGNOSIS — S7411XS Injury of femoral nerve at hip and thigh level, right leg, sequela: Secondary | ICD-10-CM

## 2016-02-23 DIAGNOSIS — N189 Chronic kidney disease, unspecified: Secondary | ICD-10-CM | POA: Insufficient documentation

## 2016-02-23 DIAGNOSIS — N183 Chronic kidney disease, stage 3 (moderate): Secondary | ICD-10-CM

## 2016-02-23 DIAGNOSIS — N289 Disorder of kidney and ureter, unspecified: Secondary | ICD-10-CM | POA: Insufficient documentation

## 2016-02-23 DIAGNOSIS — E119 Type 2 diabetes mellitus without complications: Secondary | ICD-10-CM

## 2016-02-23 DIAGNOSIS — R0989 Other specified symptoms and signs involving the circulatory and respiratory systems: Secondary | ICD-10-CM | POA: Insufficient documentation

## 2016-02-23 DIAGNOSIS — R030 Elevated blood-pressure reading, without diagnosis of hypertension: Secondary | ICD-10-CM

## 2016-02-23 DIAGNOSIS — N1832 Type 2 diabetes mellitus with diabetic chronic kidney disease: Secondary | ICD-10-CM | POA: Insufficient documentation

## 2016-02-23 DIAGNOSIS — D62 Acute posthemorrhagic anemia: Secondary | ICD-10-CM | POA: Insufficient documentation

## 2016-02-23 LAB — COMPREHENSIVE METABOLIC PANEL
ALBUMIN: 2.7 g/dL — AB (ref 3.5–5.0)
ALK PHOS: 85 U/L (ref 38–126)
ALT: 42 U/L (ref 17–63)
ANION GAP: 8 (ref 5–15)
AST: 82 U/L — ABNORMAL HIGH (ref 15–41)
BUN: 24 mg/dL — ABNORMAL HIGH (ref 6–20)
CALCIUM: 9.2 mg/dL (ref 8.9–10.3)
CHLORIDE: 105 mmol/L (ref 101–111)
CO2: 22 mmol/L (ref 22–32)
Creatinine, Ser: 1.76 mg/dL — ABNORMAL HIGH (ref 0.61–1.24)
GFR calc non Af Amer: 39 mL/min — ABNORMAL LOW (ref >60)
GFR, EST AFRICAN AMERICAN: 45 mL/min — AB (ref >60)
GLUCOSE: 159 mg/dL — AB (ref 65–99)
POTASSIUM: 4.1 mmol/L (ref 3.5–5.1)
SODIUM: 135 mmol/L (ref 135–145)
Total Bilirubin: 1.1 mg/dL (ref 0.3–1.2)
Total Protein: 7 g/dL (ref 6.5–8.1)

## 2016-02-23 LAB — CBC WITH DIFFERENTIAL/PLATELET
BASOS PCT: 1 %
Basophils Absolute: 0 10*3/uL (ref 0.0–0.1)
EOS ABS: 0.1 10*3/uL (ref 0.0–0.7)
EOS PCT: 2 %
HCT: 31.6 % — ABNORMAL LOW (ref 39.0–52.0)
HEMOGLOBIN: 10.1 g/dL — AB (ref 13.0–17.0)
Lymphocytes Relative: 18 %
Lymphs Abs: 1.5 10*3/uL (ref 0.7–4.0)
MCH: 30.4 pg (ref 26.0–34.0)
MCHC: 32 g/dL (ref 30.0–36.0)
MCV: 95.2 fL (ref 78.0–100.0)
MONOS PCT: 10 %
Monocytes Absolute: 0.8 10*3/uL (ref 0.1–1.0)
NEUTROS PCT: 69 %
Neutro Abs: 5.8 10*3/uL (ref 1.7–7.7)
PLATELETS: 289 10*3/uL (ref 150–400)
RBC: 3.32 MIL/uL — ABNORMAL LOW (ref 4.22–5.81)
RDW: 15.8 % — ABNORMAL HIGH (ref 11.5–15.5)
WBC: 8.3 10*3/uL (ref 4.0–10.5)

## 2016-02-23 NOTE — Evaluation (Signed)
Physical Therapy Assessment and Plan  Patient Details  Name: Eric [name] MRN: [id number] Date of Birth: 06-Feb-1951  PT Diagnosis: Abnormality of gait, Coordination disorder, Difficulty walking, Impaired sensation, Muscle weakness and Paraplegia Rehab Potential: Good ELOS: 20-25 days    Today's Date: 02/23/2016 PT Individual Time: 1000-1115 AND 1301- 1401 PT Individual Time Calculation (min): 75 min  AND 60 min  Problem List:  Patient Active Problem List   Diagnosis Date Noted  . Labile blood pressure   . Chronic renal insufficiency   . Acute blood loss anemia   . Diabetes mellitus type 2 in nonobese (HCC)   . Debility 02/22/2016  . 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 Weakness of both lower extremities   . Lower extremity weakness   . Pneumonia   . Acute lumbar back pain   . Non-traumatic rhabdomyolysis   . Retroperitoneal bleed   . Back pain   . Leg weakness, bilateral   . Paroxysmal atrial fibrillation ([place])   . Ventricular fibrillation ([place]) 02/09/2016  . Aortic aneurysm without rupture ([place]) 02/09/2016  . Aortic insufficiency 02/09/2016  . HCAP (healthcare-associated pneumonia)   . AKI (acute kidney injury) ([place])   . Cardiac arrest ([place]) 02/01/2016  . Acute on chronic systolic heart failure ([place])   . Acute encephalopathy   . CKD (chronic kidney disease)   . Thyroid activity decreased   . Acute hypoxemic respiratory failure ([place])   . Acute respiratory failure ([place])   . Encounter for central line placement   . Arrhythmia   . Altered mental status   . CKD (chronic kidney disease), stage IV ([place]) 08/06/2013    Class: Chronic  . Long term (current) use of anticoagulants 08/06/2013    Class: Chronic  . Special screening for malignant neoplasms, colon 04/11/2013  . Colon cancer screening 03/04/2013  . Chronic anticoagulation 03/04/2013  . Atrial flutter ([place]) 03/18/2012    Class: Acute  . Chronic systolic heart failure ([place]) 02/13/2012     Class: Acute  . Hypertension, accelerated 02/13/2012  . Aortic valve regurgitation, acquired 02/13/2012  . Aortic root enlargement ([place]) 02/13/2012  . Hyperlipidemia 02/13/2012  . Left bundle branch block 02/13/2012  . Obesity (BMI 30-39.9) 02/13/2012    Past Medical History:  Past Medical History  Diagnosis Date  . Claustrophobia   . Heart murmur   . Hypertension   . Varicose vein of leg     right  . Dysrhythmia     "palpitations"  . Migraine 02/13/12    "opthalmic"  . Chronic lower back pain   . Exertional dyspnea 01/2012  . CHF (congestive heart failure) ([place])   . Hypothyroidism   . Chronic kidney disease     kidney fx studies increased    Past Surgical History:  Past Surgical History  Procedure Laterality Date  . Skin melanocytoma excision  2012    "above left clavicle"  . Finger surgery  2012    "4th digit right hand; thumb on left hand"  . Tee without cardioversion  03/22/2012    Procedure: TRANSESOPHAGEAL ECHOCARDIOGRAM (TEE);  Surgeon: Candee Furbish, MD;  Location: [facility] ENDOSCOPY;  Service: Cardiovascular;  Laterality: N/A;  . Cardioversion  03/22/2012    Procedure: CARDIOVERSION;  Surgeon: Candee Furbish, MD;  Location: [place];  Service: Cardiovascular;  Laterality: N/A;  . Cardioversion  04/19/2012    Procedure: CARDIOVERSION;  Surgeon: Sinclair Grooms, MD;  Location: [place];  Service: Cardiovascular;  Laterality: N/A;  .  Colonoscopy N/A 04/11/2013    Procedure: COLONOSCOPY;  Surgeon: Inda Castle, MD;  Location: [facility] ENDOSCOPY;  Service: Endoscopy;  Laterality: N/A;  . Colonoscopy N/A 04/11/2013    Procedure: COLONOSCOPY;  Surgeon: Inda Castle, MD;  Location: [facility] ENDOSCOPY;  Service: Endoscopy;  Laterality: N/A;  . Tee without cardioversion N/A 02/08/2016    Procedure: TRANSESOPHAGEAL ECHOCARDIOGRAM (TEE);  Surgeon: Lelon Perla, MD;  Location: [facility] ENDOSCOPY;  Service: Cardiovascular;  Laterality: N/A;  . Radiology with anesthesia N/A 02/11/2016    Procedure:  MRI OF THE BRAIN WITHOUT CONTRAST, LUMBAR WITHOUT CONTRAST;  Surgeon: Medication Radiologist, MD;  Location: [place];  Service: Radiology;  Laterality: N/A;  DR. WOOD/MRI  . Cardiac catheterization N/A 02/17/2016    Procedure: Left Heart Cath and Coronary Angiography;  Surgeon: Jettie Booze, MD;  Location: [place] CV LAB;  Service: Cardiovascular;  Laterality: N/A;  . Ep implantable device N/A 02/17/2016    Procedure: BiV ICD Insertion CRT-D;  Surgeon: Evans Lance, MD;  Location: [place] CV LAB;  Service: Cardiovascular;  Laterality: N/A;    Assessment & Plan Clinical Impression: Patient is a 65 y.o. right handed male with history of hypertension, chronic renal insufficiency baseline creatinine 8.7-5.6, systolic congestive heart failure, atrial fibrillation on eliquis. Patient lives with wife. Independent prior to admission. 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 Presented from outside hospital 01/31/2016 with cardiac arrest while at [facility]. Wife initiated CPR calling EMS. EMS arrived within 5-10 minutes received 2 defibrillations. Troponin 0. 11. Lactic acid 1.2. Patient was intubated for airway protection and extubated 02/06/2016. Echocardiogram ejection fraction 35% diffuse hypokinesis with grade 2 diastolic dysfunction. Initially placed on intravenous amiodarone discontinued due to bradycardia and later resumed. TEE completed with moderate to severe global reduction in LV function. No evidence of vegetation. Systolic function moderately to severely reduced. Renal services Was consulted for creatinine 2.87-3.15 from baseline 1.8. Renal ultrasound without obstruction.Renal function has steadily improved to creatinine 1.53. Underwent cardiac catheterization after renal function stabilized 02/17/2016 s/p CRT-D per Dr. Osie Cheeks cardiology services. Presently on subcutaneous heparin for DVT prophylaxis. Diet has been advanced to a regular consistency.  Acute on chronic anemia 9.4 and monitored. Additionally, the patient was noted to have bilateral lower extremity weakness. An MRI, then CT of the lumbar spine and abdomen/pelvis revealed a retroperitoneal hematoma. It was felt that weakness was due to femoral nerve compression although imaging is also notable for hemorrhage around both psoas and iliacus muscles.  Patient transferred to CIR on 02/22/2016 .   Patient currently requires max with mobility secondary to muscle weakness and muscle paralysis, decreased cardiorespiratoy endurance, unbalanced muscle activation and decreased coordination and decreased standing balance, decreased balance strategies and difficulty maintaining precautions.  Prior to hospitalization, patient was independent  with mobility and lived with  Wife in a House home.  Home access is 3 Stairs to enter.  Patient will benefit from skilled PT intervention to maximize safe functional mobility, minimize fall risk and decrease caregiver burden for planned discharge home with intermittent assist.  Anticipate patient will benefit from follow up [facility] at discharge.  PT - End of Session Activity Tolerance: Tolerates 30+ min activity with multiple rests Endurance Deficit: Yes PT Assessment Rehab Potential (ACUTE/IP ONLY): Good Barriers to Discharge: Inaccessible home environment PT Patient demonstrates impairments in the following area(s): Balance;Endurance;Motor;Pain;Safety;Sensory PT Transfers Functional Problem(s): Bed Mobility;Bed to Chair;Car;Furniture;Floor PT Locomotion Functional Problem(s): Ambulation;Wheelchair Mobility;Stairs PT Plan PT Intensity: Minimum of 1-2 x/day ,  45 to 90 minutes PT Frequency: 5 out of 7 days PT Duration Estimated Length of Stay: 20-25 days  PT Treatment/Interventions: Ambulation/gait training;Balance/vestibular training;Cognitive remediation/compensation;Discharge planning;Disease management/prevention;DME/adaptive equipment instruction;Functional  mobility training;Neuromuscular re-education;Pain management;Patient/family education;Psychosocial support;Skin care/wound management;Splinting/orthotics;Stair training;Therapeutic Activities;Therapeutic Exercise;UE/LE Strength taining/ROM;UE/LE Coordination activities;Visual/perceptual remediation/compensation;Wheelchair propulsion/positioning PT Transfers Anticipated Outcome(s): Supervision- Min A  PT Locomotion Anticipated Outcome(s): Supervision -min A with LRAD PT Recommendation Follow Up Recommendations: Home health PT Patient destination: Home Equipment Recommended: To be determined;Wheelchair (measurements);Wheelchair cushion (measurements);Rolling walker with 5" wheels  Skilled Therapeutic Intervention  Session 1:  PT performed Evaluation and initiated treatment. See below. Sit<>stand Transfer x 5 throughout treatment with Max A and PT to block RLE, Stand pivot transfer to R and L with Max A from PT and blocking RLE. Car transfer with Max A from PT and no AD. For all transfers, PT provided max verbal and tactile instruction for improved sequencing and positioning for improve success and balance.   WC mobility with R UE and LLE for 50 ft min A from PT for improved use of LLE.   Gait for 5 feet with max A with +2 for WC follow. PT required to perform RLE limb advancement and prevent knee buckle in stance.   Patient returned to room and left sitting in [facility] with call bell within reach.    Session 2:  Patient received sitting in WC and agreeable to PT. Patient performed WC mobility for 16f with RUE propulsion and BLE with min A with turn to L and max cues for proper sequencing of movement.   SB transfer to and from bed with mod A from PT and max verbal and tactile cues for use of R UE and BLE for gluteal clearance and improved coordination of BLE movements.   Sit<>supine transfer with mod A from PT and max cues for UE and LE placement to prevent excessive WB through LUE.   Supine  therex:  LLE SAQ x 10 AROM, R LE SAQ x 10 AAROM Bridges with PT to maintain hip in neutral rotation x 8  Hip adduction against gravity in clam shell position x 10 BLE  Hip abduction against light manual resistance  X 10  Min A from PT for improved LE placement and max cues to decrease compensation from trunk.     PT Evaluation Precautions/Restrictions Precautions Precautions: ICD/Pacemaker Restrictions Weight Bearing Restrictions: Yes LUE Weight Bearing: Non weight bearing General Chart Reviewed: Yes Family/Caregiver Present: No Vital Signs Pain Pain Assessment Pain Assessment: No/denies pain Pain Score: 0-No pain Pain Type: Acute pain Pain Location: Knee Pain Orientation: Right Pain Descriptors / Indicators: Aching Pain Frequency: Intermittent Pain Onset: Gradual Patients Stated Pain Goal: 2 Pain Intervention(s): Medication (See eMAR);Repositioned Multiple Pain Sites: No Home Living/Prior Functioning Home Living Type of Home: House Home Access: Stairs to enter Entrance Stairs-Number of Steps: 3  Entrance Stairs-Rails: Right;Left (cannot reach [place] rails. 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  Vision/Perception     Cognition Overall Cognitive Status: Within Functional Limits for tasks assessed Orientation Level: Oriented X4 Safety/Judgment: Appears intact Sensation Sensation Light Touch: Impaired Detail Light Touch Impaired Details: Impaired RLE Additional Comments:  Impaired Light touch appreciation on the RLE in  L2-L4 dermatomes.  Coordination Gross Motor Movements are Fluid and Coordinated: No Fine Motor Movements are Fluid and Coordinated: No Coordination and Movement Description: R>L LLE weakness.  Heel Shin Test: unable to performed due to significant decrease in BLE hip flexion strength.  Motor  Motor Motor: Other (comment);Paraplegia Motor - Skilled Clinical Observations: Patient demonstrates BLE muscle weakness R more imparied than L.   Mobility Bed Mobility Bed Mobility: Rolling Right;Rolling Left;Supine to Sit;Sit to Supine Rolling Right: 4: Min assist Rolling Right Details: Tactile cues for initiation;Tactile cues for weight shifting;Tactile cues for placement;Visual cues for safe use of DME/AD;Visual cues/gestures for precautions/safety;Verbal cues for technique;Verbal cues for sequencing;Verbal cues for precautions/safety;Verbal cues for safe use of DME/AE;Manual facilitation for weight shifting;Manual facilitation for placement Rolling Left: 3: Mod assist Rolling Left Details: Tactile cues for sequencing;Tactile cues for weight shifting;Tactile cues for posture;Tactile cues for placement;Tactile cues for weight beaing;Visual cues/gestures for precautions/safety;Visual cues for safe use of DME/AE;Verbal cues for technique;Verbal cues for gait pattern;Verbal cues for safe use of DME/AE;Manual facilitation for weight shifting;Manual facilitation for weight bearing;Manual facilitation for placement;Verbal cues for sequencing Supine to Sit: 3: Mod assist Supine to Sit Details: Manual facilitation for placement;Manual facilitation for weight bearing;Manual facilitation for weight shifting;Tactile cues for sequencing;Tactile cues for weight shifting;Tactile cues for posture;Tactile cues for placement;Tactile cues for weight beaing;Visual cues/gestures for sequencing;Verbal cues for technique;Visual cues/gestures for precautions/safety;Verbal cues for sequencing;Verbal cues for  precautions/safety;Verbal cues for safe use of DME/AE Sit to Supine: 3: Mod assist Sit to Supine - Details: Verbal cues for sequencing;Verbal cues for technique;Verbal cues for precautions/safety;Verbal cues for gait pattern;Verbal cues for safe use of DME/AE;Tactile cues for sequencing;Tactile cues for weight shifting;Tactile cues for placement;Manual facilitation for weight bearing;Visual cues/gestures for precautions/safety Transfers Transfers: Yes Sit to Stand: 2: Max assist Sit to Stand Details: Tactile cues for initiation;Tactile cues for weight shifting;Tactile cues for posture;Tactile cues for placement;Visual cues for safe use of DME/AE;Visual cues/gestures for precautions/safety;Visual cues/gestures for sequencing;Verbal cues for technique;Verbal cues for sequencing;Verbal cues for precautions/safety;Verbal cues for gait pattern;Verbal cues for safe use of DME/AE;Manual facilitation for weight shifting;Manual facilitation for placement;Manual facilitation for weight bearing Stand Pivot Transfers: 2: Max assist Stand Pivot Transfer Details: Tactile cues for initiation;Tactile cues for sequencing;Tactile cues for weight shifting;Visual cues for safe use of DME/AE;Visual cues/gestures for precautions/safety;Visual cues/gestures for sequencing;Tactile cues for placement;Verbal cues for sequencing;Verbal cues for technique;Verbal cues for precautions/safety;Verbal cues for safe use of DME/AE;Manual facilitation for placement;Manual facilitation for weight bearing;Manual facilitation for weight shifting;Tactile cues for weight beaing;Tactile cues for posture Squat Pivot Transfers: 2: Max Risk manager Details: Tactile cues for sequencing;Tactile cues for weight shifting;Tactile cues for posture;Tactile cues for placement;Tactile cues for weight beaing;Visual cues for safe use of DME/AE;Visual cues/gestures for precautions/safety;Visual cues/gestures for sequencing;Verbal cues for gait  pattern;Verbal cues for safe use of DME/AE;Verbal cues for precautions/safety;Verbal cues for technique;Manual facilitation for weight shifting;Manual facilitation for placement;Manual facilitation for weight bearing Locomotion  Ambulation Ambulation: Yes Ambulation/Gait Assistance: 2: Max assist Ambulation Distance (Feet): 5 Feet Assistive device: Other (Comment) (rail in hall ) Ambulation/Gait Assistance Details: PT required to block R knee and faciliate hip flexion and knee extension for limb advancement.  Stairs / Additional Locomotion Stairs: No Architect: Yes Wheelchair Assistance: 4: Energy manager: Right upper extremity;Left lower extremity Wheelchair Parts Management: Needs assistance Distance: 29f  Trunk/Postural Assessment  Cervical Assessment Cervical Assessment: Within Functional Limits Thoracic  Assessment Thoracic Assessment: Within Functional Limits (mild increased thoracic flexion) Lumbar Assessment Lumbar Assessment: Exceptions to [facility] (decreased lumbar extension on the on the R side ) Postural Control Postural Control: Deficits on evaluation Righting Reactions: delays.  Postural Limitations: deficits.   Balance Balance Balance Assessed: Yes Static Sitting Balance Static Sitting - Level of Assistance: 6: Modified independent (Device/Increase time) Dynamic Sitting Balance Dynamic Sitting - Level of Assistance: 5: Stand by assistance Static Standing Balance Static Standing - Level of Assistance: 2: Max assist Extremity Assessment      RLE Assessment RLE Assessment: Exceptions to [facility] RLE AROM (degrees) RLE Overall AROM Comments: unable to perform hip flexion/knee extension against gravity.  RLE Strength RLE Overall Strength: Deficits Right Hip Flexion: 2-/5 Right Hip Extension: 4-/5 Right Hip ABduction: 4-/5 Right Hip ADduction: 3+/5 Right Knee Flexion: 3+/5 Right Knee Extension: 2-/5 Right Ankle  Dorsiflexion: 4-/5 Right Ankle Plantar Flexion: 3+/5 LLE Assessment LLE Assessment: Exceptions to WFL LLE AROM (degrees) LLE Overall AROM Comments: decrease hip flexion by 50% LLE Strength Left Hip Flexion: 3+/5 Left Hip Extension: 4-/5 Left Hip ABduction: 4-/5 Left Hip ADduction: 3+/5 Left Knee Flexion: 4-/5 Left Knee Extension: 4-/5 Left Ankle Dorsiflexion: 4-/5 Left Ankle Plantar Flexion: 3+/5   See Function Navigator for Current Functional Status.   Refer to Care Plan for Long Term Goals  Recommendations for other services: None  Discharge Criteria: Patient will be discharged from PT if patient refuses treatment 3 consecutive times without medical reason, if treatment goals not met, if there is a change in medical status, if patient makes no progress towards goals or if patient is discharged from hospital.  The above assessment, treatment plan, treatment alternatives and goals were discussed and mutually agreed upon: by patient  [name] 02/23/2016, 12:43 PM

## 2016-02-23 NOTE — IPOC Note (Signed)
Overall Plan of Care [facility]) Patient Details Name: Gwynne Heffernan MRN: [id number] DOB: 02-Jun-1951  Admitting Diagnosis: Cardiac arrest, femoral neuropathy  Hospital Problems: Principal Problem:   Femoral nerve injury Active Problems:   Cardiac arrest ([place])   Retroperitoneal bleed   Debility   Labile blood pressure   Chronic renal insufficiency   Acute blood loss anemia   Diabetes mellitus type 2 in nonobese [facility])     Functional Problem List: Nursing Edema, Endurance, Medication Management, Motor, Pain, Safety, Sensory  PT Balance, Endurance, Motor, Pain, Safety, Sensory  OT Balance, Endurance, Motor, Pain, Safety, Sensory  SLP    TR         Basic ADL's: OT Grooming, Bathing, Dressing, Toileting     Advanced  ADL's: OT Simple Meal Preparation, Laundry     Transfers: PT Bed Mobility, Bed to Chair, Musician, [employer], Futures trader, Metallurgist: PT Ambulation, Emergency planning/management officer, Stairs     Additional Impairments: OT None  SLP        TR      Anticipated Outcomes Item Anticipated Outcome  Self Feeding n/a  Swallowing      Basic self-care  supervision overall  Toileting  supervision   Bathroom Transfers supervision - tolet, min A - shower  Bowel/Bladder  Min assist  Transfers  Supervision- Min A   Locomotion  Supervision -min A with LRAD  Communication     Cognition     Pain  3 or less  Safety/Judgment  mod assist   Therapy Plan: PT Intensity: Minimum of 1-2 x/day ,45 to 90 minutes PT Frequency: 5 out of 7 days PT Duration Estimated Length of Stay: 20-25 days  OT Intensity: Minimum of 1-2 x/day, 45 to 90 minutes OT Frequency: 5 out of 7 days OT Duration/Estimated Length of Stay: 20-24 days         Team Interventions: Nursing Interventions Patient/Family Education, Medication Management, Disease Management/Prevention, Pain Management, Discharge Planning  PT interventions Ambulation/gait training, Balance/vestibular training,  Cognitive remediation/compensation, Discharge planning, Disease management/prevention, DME/adaptive equipment instruction, Functional mobility training, Neuromuscular re-education, Pain management, Patient/family education, Psychosocial support, Skin care/wound management, Splinting/orthotics, Stair training, Therapeutic Activities, Therapeutic Exercise, UE/LE Strength taining/ROM, UE/LE Coordination activities, Visual/perceptual remediation/compensation, Wheelchair propulsion/positioning  OT Interventions Training and development officer, Community reintegration, Discharge planning, Pain management, UE/LE Coordination activities, Functional mobility training, Self Care/advanced ADL retraining, UE/LE Strength taining/ROM, Therapeutic Exercise, Psychosocial support, Patient/family education, DME/adaptive equipment instruction, Therapeutic Activities  SLP Interventions    TR Interventions    SW/CM Interventions Discharge Planning, Psychosocial Support, Patient/Family Education    Team Discharge Planning: Destination: PT-Home ,OT- Home , SLP-  Projected Follow-up: PT-Home health PT, OT-  Home health OT, 24 hour supervision/assistance, SLP-  Projected Equipment Needs: PT-To be determined, Wheelchair (measurements), Wheelchair cushion (measurements), Rolling walker with 5" wheels, OT- To be determined, SLP-  Equipment Details: PT- , OT-  Patient/family involved in discharge planning: PT- Patient,  OT-Patient, SLP-   MD ELOS: 12-16 days. Medical Rehab Prognosis:  Excellent Assessment: 65 y.o. right handed male with history of hypertension, chronic renal insufficiency baseline creatinine [id number], systolic congestive heart failure, atrial fibrillation on eliquis. Patient lives with wife. Independent prior to admission. 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 Presented from outside hospital 01/31/2016 with cardiac arrest while at [facility]. Wife initiated CPR calling EMS. EMS arrived  within 5-10 minutes received 2 defibrillations. Troponin 0. 11. Lactic acid 1.2. Patient was intubated for airway protection and extubated  02/06/2016. Echocardiogram ejection fraction 35% diffuse hypokinesis with grade 2 diastolic dysfunction. Initially placed on intravenous amiodarone discontinued due to bradycardia and later resumed. TEE completed with moderate to severe global reduction in LV function. No evidence of vegetation. Systolic function moderately to severely reduced. Renal services Was consulted for creatinine 2.87-3.15 from baseline 1.8. Renal ultrasound without obstruction.Renal function has steadily improved to creatinine 1.53. Underwent cardiac catheterization after renal function stabilized 02/17/2016 s/p CRT-D per Dr. Osie Cheeks cardiology services. Presently on subcutaneous heparin for DVT prophylaxis. Diet has been advanced to a regular consistency. Acute on chronic anemia 9.4 and monitored. Additionally, the patient was noted to have bilateral lower extremity weakness. An MRI, then CT of the lumbar spine and abdomen/pelvis revealed a retroperitoneal hematoma. It was felt that weakness was due to femoral nerve compression although imaging is also notable for hemorrhage around both psoas and iliacus muscles. Patient with resulting deficits in gait mobility and proximal lower extremity tasks. Will set goals for Supervision/Min A with therapies.  See Team Conference Notes for weekly updates to the plan of care

## 2016-02-23 NOTE — Progress Notes (Signed)
Patient information reviewed and entered into eRehab system by Lawerance Matsuo, RN, CRRN, PPS Coordinator.  Information including medical coding and functional independence measure will be reviewed and updated through discharge.    

## 2016-02-23 NOTE — Care Management Note (Signed)
Inpatient [facility] Individual Statement of Services  Patient Name:  Eric [name]  Date:  02/23/2016  Welcome to the [facility].  Our goal is to provide you with an individualized program based on your diagnosis and situation, designed to meet your specific needs.  With this comprehensive rehabilitation program, you will be expected to participate in at least 3 hours of rehabilitation therapies Monday-Friday, with modified therapy programming on the weekends.  Your rehabilitation program will include the following services:  Physical Therapy (PT), Occupational Therapy (OT), Speech Therapy (ST), 24 hour per day rehabilitation nursing, Therapeutic Recreaction (TR), Case Management (Social Worker), Rehabilitation Medicine, Nutrition Services and Pharmacy Services  Weekly team conferences will be held on Wednesday to discuss your progress.  Your Social Worker will talk with you frequently to get your input and to update you on team discussions.  Team conferences with you and your family in attendance may also be held.  Expected length of stay: 20-25 days  Overall anticipated outcome: supervision/min assist level  Depending on your progress and recovery, your program may change. Your Social Worker will coordinate services and will keep you informed of any changes. Your Social Worker's name and contact numbers are listed  below.  The following services may also be recommended but are not provided by the [facility] will be made to provide these services after discharge if needed.  Arrangements include referral to agencies that provide these services.  Your insurance has been verified to be:  [employer] Your primary doctor is:  Glendale Chard  Pertinent information will be shared with your doctor and  your insurance company.  Social Worker:  Ovidio Kin, [place] or ([phone]  Information discussed with and copy given to patient by: [name], 02/23/2016, 2:49 PM

## 2016-02-23 NOTE — Progress Notes (Signed)
Social Work  Social Work Assessment and Plan  Patient Details  Name: Eric [name] MRN: [id number] Date of Birth: 08-03-1951  Today's Date: 02/23/2016  Problem List:  Patient Active Problem List   Diagnosis Date Noted  . Labile blood pressure   . Chronic renal insufficiency   . Acute blood loss anemia   . Diabetes mellitus type 2 in nonobese (HCC)   . Debility 02/22/2016  . 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 Weakness of both lower extremities   . Lower extremity weakness   . Pneumonia   . Acute lumbar back pain   . Non-traumatic rhabdomyolysis   . Retroperitoneal bleed   . Back pain   . Leg weakness, bilateral   . Paroxysmal atrial fibrillation ([place])   . Ventricular fibrillation ([place]) 02/09/2016  . Aortic aneurysm without rupture ([place]) 02/09/2016  . Aortic insufficiency 02/09/2016  . HCAP (healthcare-associated pneumonia)   . AKI (acute kidney injury) ([place])   . Cardiac arrest ([place]) 02/01/2016  . Acute on chronic systolic heart failure ([place])   . Acute encephalopathy   . CKD (chronic kidney disease)   . Thyroid activity decreased   . Acute hypoxemic respiratory failure ([place])   . Acute respiratory failure ([place])   . Encounter for central line placement   . Arrhythmia   . Altered mental status   . CKD (chronic kidney disease), stage IV ([place]) 08/06/2013    Class: Chronic  . Long term (current) use of anticoagulants 08/06/2013    Class: Chronic  . Special screening for malignant neoplasms, colon 04/11/2013  . Colon cancer screening 03/04/2013  . Chronic anticoagulation 03/04/2013  . Atrial flutter ([place]) 03/18/2012    Class: Acute  . Chronic systolic heart failure ([place]) 02/13/2012    Class: Acute  . Hypertension, accelerated 02/13/2012  . Aortic valve regurgitation, acquired 02/13/2012  . Aortic root enlargement ([place]) 02/13/2012  . Hyperlipidemia 02/13/2012  . Left bundle branch block 02/13/2012  . Obesity (BMI 30-39.9) 02/13/2012   Past  Medical History:  Past Medical History  Diagnosis Date  . Claustrophobia   . Heart murmur   . Hypertension   . Varicose vein of leg     right  . Dysrhythmia     "palpitations"  . Migraine 02/13/12    "opthalmic"  . Chronic lower back pain   . Exertional dyspnea 01/2012  . CHF (congestive heart failure) ([place])   . Hypothyroidism   . Chronic kidney disease     kidney fx studies increased    Past Surgical History:  Past Surgical History  Procedure Laterality Date  . Skin melanocytoma excision  2012    "above left clavicle"  . Finger surgery  2012    "4th digit right hand; thumb on left hand"  . Tee without cardioversion  03/22/2012    Procedure: TRANSESOPHAGEAL ECHOCARDIOGRAM (TEE);  Surgeon: Candee Furbish, MD;  Location: [facility] ENDOSCOPY;  Service: Cardiovascular;  Laterality: N/A;  . Cardioversion  03/22/2012    Procedure: CARDIOVERSION;  Surgeon: Candee Furbish, MD;  Location: [facility];  Service: Cardiovascular;  Laterality: N/A;  . Cardioversion  04/19/2012    Procedure: CARDIOVERSION;  Surgeon: Sinclair Grooms, MD;  Location: [facility];  Service: Cardiovascular;  Laterality: N/A;  . Colonoscopy N/A 04/11/2013    Procedure: COLONOSCOPY;  Surgeon: Inda Castle, MD;  Location: [facility] ENDOSCOPY;  Service: Endoscopy;  Laterality: N/A;  . Colonoscopy N/A 04/11/2013    Procedure: COLONOSCOPY;  Surgeon: Sandy Salaam  Deatra Ina, MD;  Location: [facility] ENDOSCOPY;  Service: Endoscopy;  Laterality: N/A;  . Tee without cardioversion N/A 02/08/2016    Procedure: TRANSESOPHAGEAL ECHOCARDIOGRAM (TEE);  Surgeon: Lelon Perla, MD;  Location: [facility] ENDOSCOPY;  Service: Cardiovascular;  Laterality: N/A;  . Radiology with anesthesia N/A 02/11/2016    Procedure: MRI OF THE BRAIN WITHOUT CONTRAST, LUMBAR WITHOUT CONTRAST;  Surgeon: Medication Radiologist, MD;  Location: [facility];  Service: Radiology;  Laterality: N/A;  DR. WOOD/MRI  . Cardiac catheterization N/A 02/17/2016    Procedure: Left Heart Cath and Coronary Angiography;   Surgeon: Jettie Booze, MD;  Location: [facility];  Service: Cardiovascular;  Laterality: N/A;  . Ep implantable device N/A 02/17/2016    Procedure: BiV ICD Insertion CRT-D;  Surgeon: Evans Lance, MD;  Location: [facility];  Service: Cardiovascular;  Laterality: N/A;   Social History:  reports that he has never smoked. He has never used smokeless tobacco. He reports that he drinks alcohol. He reports that he does not use illicit drugs.  Family / Support Systems Marital Status: Married Patient Roles: Spouse, Parent, Other (Comment) (MD-Ophthalmologist) Spouse/Significant Other: Von [id number]  [phone] Children: Hager-son [phone] Was here but has returned to [place] Other Supports: [employer] members and colleagues Anticipated Caregiver: Wife Ability/Limitations of Caregiver: Wife is the Environmental education officer and works daytime Caregiver Availability: Other (Comment) (Wife to make arrangements for someone to be with him while she is gone during the day) Family Dynamics: Close knit small family who are supportive of one another. Son was here but has gone back to [place]. They have friends, church members and colleagues who are supportive to both while here and visit him also.  Social History Preferred language: English Religion: Baptist Cultural Background: Pt is a seventh day adventist and manages his no pork diet Education: MD-specialization in Ophthalmology Read: Yes Write: Yes Employment Status: Employed Name of Employer: Holiday representative of [employer] of Employment: 37 Return to Work Plans: Plans to but dependent upon his recovery from this Freight forwarder Issues: No issues Guardian/Conservator: None-according to MD pt is capable of making his own decisions while here. Will make sure wife is here also if needed.   Abuse/Neglect Physical Abuse: Denies Verbal Abuse: Denies Sexual Abuse: Denies Exploitation of patient/patient's resources:  Denies Self-Neglect: Denies  Emotional Status Pt's affect, behavior adn adjustment status: Pt is motivated to recover and take care of himself, like he always has. He has health issues but were managed until this happened. He wants to get as independent as possible before going home.  Recent Psychosocial Issues: other health issues-were being managed Pyschiatric History: No history deferred depression seems to be coping appropriately and adjusting to his first day of rehab. He would beneifit seeing neuro-psych during his stay due to al tha has happened to him and for coping, will ask team if feels would be approrpriate. 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 Pt feels his questions and concerns have been addressed and is glad he is here on rehab. Premorbid pt/family roles/activities: Husband, MD, father, Theodoro Kos member, friend, etc Anticipated changes in roles/activities/participation: resume Pt/family expectations/goals: Pt states: " I want to get back as much as I can before going home."  Wife states: " I hope he does well  here and recovers from this."  [employer]: None Premorbid Home Care/DME Agencies: None Transportation available at discharge: Wife Resource referrals recommended: Neuropsychology, Support group (specify)  Discharge Planning Living Arrangements: Spouse/significant other Support Systems: Spouse/significant other, Children, Water engineer, Social worker community Type of Residence: Private residence Insurance Resources: Multimedia programmer (specify) Printmaker) Financial Resources: Employment Museum/gallery curator Screen Referred: No Living Expenses: Lives with family Money Management: Spouse, Patient Does the patient have any  problems obtaining your medications?: No Home Management: Both he and wife  Patient/Family Preliminary Plans: Return home with wife and others assisting with his care. Wife is aware he will need 24 hr supervision and will be arranging this for when she is at their business. Have encouraged wife to attend therapies with pt to see his progress, she will do this. Social Work Anticipated Follow Up Needs: HH/OP, Support Group  Clinical Impression Pleasant gentleman who is motivated to do well and recover from his multiple medical issues. His wife is involved and supportive and willing to do what she needs to do to assist him  In his recovery. They have good social supports who visit and will assist at discharge. Will await team's evaluations and work on a safe discharge plan. Think pt would benefit from seeing neuro-psych while here.  [name] 02/23/2016, 3:10 PM

## 2016-02-23 NOTE — Progress Notes (Signed)
Occupational Therapy Assessment and Plan  Patient Details  Name: Eric [name] MRN: [id number] Date of Birth: [birth year]  OT Diagnosis: muscle weakness (generalized) and coordination disorder,paraplegia Rehab Potential: Rehab Potential (ACUTE ONLY): Good ELOS: 20-24 days   Today's Date: 02/23/2016 OT Individual Time: 0800-0900 OT Individual Time Calculation (min): 60 min     Problem List:  Patient Active Problem List   Diagnosis Date Noted  . Labile blood pressure   . Chronic renal insufficiency   . Acute blood loss anemia   . Diabetes mellitus type 2 in nonobese (HCC)   . Debility 02/22/2016  . 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 Weakness of both lower extremities   . Lower extremity weakness   . Pneumonia   . Acute lumbar back pain   . Non-traumatic rhabdomyolysis   . Retroperitoneal bleed   . Back pain   . Leg weakness, bilateral   . Paroxysmal atrial fibrillation ([place])   . Ventricular fibrillation ([place]) 02/09/2016  . Aortic aneurysm without rupture ([place]) 02/09/2016  . Aortic insufficiency 02/09/2016  . HCAP (healthcare-associated pneumonia)   . AKI (acute kidney injury) ([place])   . Cardiac arrest ([place]) 02/01/2016  . Acute on chronic systolic heart failure ([place])   . Acute encephalopathy   . CKD (chronic kidney disease)   . Thyroid activity decreased   . Acute hypoxemic respiratory failure ([place])   . Acute respiratory failure ([place])   . Encounter for central line placement   . Arrhythmia   . Altered mental status   . CKD (chronic kidney disease), stage IV ([place]) 08/06/2013    Class: Chronic  . Long term (current) use of anticoagulants 08/06/2013    Class: Chronic  . Special screening for malignant neoplasms, colon 04/11/2013  . Colon cancer screening 03/04/2013  . Chronic anticoagulation 03/04/2013  . Atrial flutter ([place]) 03/18/2012    Class: Acute  . Chronic systolic heart failure ([place]) 02/13/2012    Class: Acute  . Hypertension,  accelerated 02/13/2012  . Aortic valve regurgitation, acquired 02/13/2012  . Aortic root enlargement ([place]) 02/13/2012  . Hyperlipidemia 02/13/2012  . Left bundle branch block 02/13/2012  . Obesity (BMI 30-39.9) 02/13/2012    Past Medical History:  Past Medical History  Diagnosis Date  . Claustrophobia   . Heart murmur   . Hypertension   . Varicose vein of leg     right  . Dysrhythmia     "palpitations"  . Migraine 02/13/12    "opthalmic"  . Chronic lower back pain   . Exertional dyspnea 01/2012  . CHF (congestive heart failure) ([place])   . Hypothyroidism   . Chronic kidney disease     kidney fx studies increased    Past Surgical History:  Past Surgical History  Procedure Laterality Date  . Skin melanocytoma excision  2012    "above left clavicle"  . Finger surgery  2012    "4th digit right hand; thumb on left hand"  . Tee without cardioversion  03/22/2012    Procedure: TRANSESOPHAGEAL ECHOCARDIOGRAM (TEE);  Surgeon: Candee Furbish, MD;  Location: [facility] ENDOSCOPY;  Service: Cardiovascular;  Laterality: N/A;  . Cardioversion  03/22/2012    Procedure: CARDIOVERSION;  Surgeon: Candee Furbish, MD;  Location: [place];  Service: Cardiovascular;  Laterality: N/A;  . Cardioversion  04/19/2012    Procedure: CARDIOVERSION;  Surgeon: Sinclair Grooms, MD;  Location: [place];  Service: Cardiovascular;  Laterality: N/A;  . Colonoscopy N/A 04/11/2013  Procedure: COLONOSCOPY;  Surgeon: Inda Castle, MD;  Location: [facility] ENDOSCOPY;  Service: Endoscopy;  Laterality: N/A;  . Colonoscopy N/A 04/11/2013    Procedure: COLONOSCOPY;  Surgeon: Inda Castle, MD;  Location: [facility] ENDOSCOPY;  Service: Endoscopy;  Laterality: N/A;  . Tee without cardioversion N/A 02/08/2016    Procedure: TRANSESOPHAGEAL ECHOCARDIOGRAM (TEE);  Surgeon: Lelon Perla, MD;  Location: [facility] ENDOSCOPY;  Service: Cardiovascular;  Laterality: N/A;  . Radiology with anesthesia N/A 02/11/2016    Procedure: MRI OF THE BRAIN WITHOUT  CONTRAST, LUMBAR WITHOUT CONTRAST;  Surgeon: Medication Radiologist, MD;  Location: [place];  Service: Radiology;  Laterality: N/A;  DR. WOOD/MRI  . Cardiac catheterization N/A 02/17/2016    Procedure: Left Heart Cath and Coronary Angiography;  Surgeon: Jettie Booze, MD;  Location: [place] CV LAB;  Service: Cardiovascular;  Laterality: N/A;  . Ep implantable device N/A 02/17/2016    Procedure: BiV ICD Insertion CRT-D;  Surgeon: Evans Lance, MD;  Location: [place] CV LAB;  Service: Cardiovascular;  Laterality: N/A;    Assessment & Plan Clinical Impression: Patient is a 65 y.o. year old male with history of hypertension, chronic renal insufficiency baseline creatinine 9.7-6.7, systolic congestive heart failure, atrial fibrillation on eliquis. Patient lives with wife. Independent prior to admission. 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 Presented from outside hospital 01/31/2016 with cardiac arrest while at [facility]. Wife initiated CPR calling EMS. EMS arrived within 5-10 minutes received 2 defibrillations. Troponin 0. 11. Lactic acid 1.2. Patient was intubated for airway protection and extubated 02/06/2016. Echocardiogram ejection fraction 35% diffuse hypokinesis with grade 2 diastolic dysfunction. Initially placed on intravenous amiodarone discontinued due to bradycardia and later resumed. TEE completed with moderate to severe global reduction in LV function. No evidence of vegetation. Systolic function moderately to severely reduced. Renal services Was consulted for creatinine 2.87-3.15 from baseline 1.8. Renal ultrasound without obstruction.Renal function has steadily improved to creatinine 1.53. Underwent cardiac catheterization after renal function stabilized 02/17/2016 s/p CRT-D per Dr. Osie Cheeks cardiology services. Presently on subcutaneous heparin for DVT prophylaxis. Diet has been advanced to a regular consistency. Acute on chronic anemia 9.4 and  monitored. Additionally, the patient was noted to have bilateral lower extremity weakness. An MRI, then CT of the lumbar spine and abdomen/pelvis revealed a retroperitoneal hematoma. It was felt that weakness was due to femoral nerve compression although imaging is also notable for hemorrhage around both psoas and iliacus muscles. Physical therapy evaluation has been completed with recommendations of physical medicine rehabilitation consult. Patient was admitted for comprehensive rehabilitation program .  Patient transferred to CIR on 02/22/2016 .    Patient currently requires mod - total A with basic self-care skills and IADL secondary to muscle weakness, decreased cardiorespiratoy endurance, decreased coordination and decreased sitting balance, decreased standing balance and decreased balance strategies.  Prior to hospitalization, patient could complete ADLs and IADLs with independent .  Patient will benefit from skilled intervention to decrease level of assist with basic self-care skills prior to discharge home with care partner.  Anticipate patient will require 24 hour supervision and minimal physical assistance and follow up home health.  OT - End of Session Activity Tolerance: Decreased this session Endurance Deficit: Yes Endurance Deficit Description: frequent rest breaks secondary to fatigue OT Assessment Rehab Potential (ACUTE ONLY): Good OT Patient demonstrates impairments in the following area(s): Balance;Endurance;Motor;Pain;Safety;Sensory OT Basic ADL's Functional Problem(s): Grooming;Bathing;Dressing;Toileting OT Advanced ADL's Functional Problem(s): Simple Meal Preparation;Laundry OT Transfers Functional Problem(s): Toilet;Tub/Shower  OT Additional Impairment(s): None OT Plan OT Intensity: Minimum of 1-2 x/day, 45 to 90 minutes OT Frequency: 5 out of 7 days OT Duration/Estimated Length of Stay: 20-24 days OT Treatment/Interventions: Medical illustrator training;Community  reintegration;Discharge planning;Pain management;UE/LE Coordination activities;Functional mobility training;Self Care/advanced ADL retraining;UE/LE Strength taining/ROM;Therapeutic Exercise;Psychosocial support;Patient/family education;DME/adaptive equipment instruction;Therapeutic Activities OT Self Feeding Anticipated Outcome(s): n/a OT Basic Self-Care Anticipated Outcome(s): supervision overall OT Toileting Anticipated Outcome(s): supervision OT Bathroom Transfers Anticipated Outcome(s): supervision - tolet, min A - shower OT Recommendation Patient destination: Home Follow Up Recommendations: Home health OT;24 hour supervision/assistance Equipment Recommended: To be determined   Skilled Therapeutic Intervention Upon entering the room, pt seated on EOB finishing breakfast with no c/o pain this session. OT educating pt on OT purpose, POC, and goals with pt verbalizing understanding and agreement. Pt declined shower this session but agreeable to bathing while seated on EOB. This session OT having pt following NWB and pacemaker precautions with L UE until further clarification by MD. Pt  Performed UB bathing while seated on EOB and returning to supine in order to wash peri area safely. Once returning to EOB with mod A pt verbalizing need for BM. Therapist educated pt on use of STEDY for transfer while maintaining NWB on L UE. RN provided second person assist for safety. Pt standing into STEDY frame with max A from EOB. Pt transferred onto [facility] with +2 assist for safety. Pt remained seated on BSC with RN present and OT exiting the room with call bell within reach.   OT Evaluation Precautions/Restrictions  Restrictions Weight Bearing Restrictions: Yes LUE Weight Bearing: Non weight bearing Vital Signs Therapy Vitals Temp: 98.4 F (36.9 C) Temp Source: Oral Pulse Rate: 85 Resp: 18 BP: 112/60 mmHg Patient Position (if appropriate): Sitting Oxygen Therapy SpO2: 100 % O2 Device: Not  Delivered Pain Pain Assessment Pain Assessment: No/denies pain Pain Score: 7  Pain Type: Acute pain Pain Location: Hip Pain Orientation: Right Pain Descriptors / Indicators: Aching;Discomfort Pain Frequency: Intermittent Pain Onset: Gradual Patients Stated Pain Goal: 3 Pain Intervention(s): Medication (See eMAR);Repositioned Home Living/Prior Functioning Home Living Living Arrangements: Spouse/significant other Type of Home: House Home Access: Stairs to enter Entrance Stairs-Number of Steps: 3  Entrance Stairs-Rails: Right, Left (cannot reach [place] rails. 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  Leisure: Hobbies-yes (Comment) Comments: go to gym.  Vision/Perception  Vision- History Baseline Vision/History: No visual deficits;Wears glasses Wears Glasses: Reading only Patient Visual Report: No change from baseline Vision- Assessment Vision Assessment?: No apparent visual deficits  Cognition Overall Cognitive Status: Within Functional Limits for tasks assessed Arousal/Alertness: Awake/alert Orientation Level: Person;Place;Situation Person: Oriented Place: Oriented Situation: Oriented Year: 2017 Month: May Day of Week: Correct Memory: Appears intact Immediate Memory Recall: Sock;Blue;Bed Memory Recall: Sock;Blue;Bed Memory Recall Sock: Without Cue Memory Recall Blue: Without Cue Memory Recall Bed: Without Cue Safety/Judgment: Appears intact Sensation Sensation Light Touch: Impaired Detail Light Touch Impaired Details: Impaired RLE Stereognosis: Not tested Hot/Cold: Not  tested Additional Comments: Impaired Light touch appreciation on the RLE in L2-L4 dermatomes.  Coordination Gross Motor Movements are Fluid and Coordinated: No Fine Motor Movements are Fluid and Coordinated: No Coordination and Movement Description: R>L LLE weakness.  Motor  Motor Motor: Other (comment);Paraplegia Motor - Skilled Clinical Observations: Patient demonstrates BLE muscle weakness R more imparied than L.  Mobility  Bed Mobility Bed Mobility: Rolling Right;Rolling Left;Supine to Sit;Sit to Supine Rolling Right: 4: Min assist Rolling Right Details: Tactile cues for initiation;Tactile cues for weight shifting;Tactile cues for placement;Visual cues for safe use of DME/AD;Visual cues/gestures for precautions/safety;Verbal cues for technique;Verbal cues for sequencing;Verbal cues for precautions/safety;Verbal cues for safe use of DME/AE;Manual facilitation for weight shifting;Manual facilitation for placement Rolling Left: 3: Mod assist Rolling Left Details: Tactile cues for sequencing;Tactile cues for weight shifting;Tactile cues for posture;Tactile cues for placement;Tactile cues for weight beaing;Visual cues/gestures for precautions/safety;Visual cues for safe use of DME/AE;Verbal cues for technique;Verbal cues for gait pattern;Verbal cues for safe use of DME/AE;Manual facilitation for weight shifting;Manual facilitation for weight bearing;Manual facilitation for placement;Verbal cues for sequencing Supine to Sit: 3: Mod assist Supine to Sit Details: Manual facilitation for placement;Manual facilitation for weight bearing;Manual facilitation for weight shifting;Tactile cues for sequencing;Tactile cues for weight shifting;Tactile cues for posture;Tactile cues for placement;Tactile cues for weight beaing;Visual cues/gestures for sequencing;Verbal cues for technique;Visual cues/gestures for precautions/safety;Verbal cues for sequencing;Verbal cues for precautions/safety;Verbal cues for safe  use of DME/AE Sit to Supine: 3: Mod assist Sit to Supine - Details: Verbal cues for sequencing;Verbal cues for technique;Verbal cues for precautions/safety;Verbal cues for gait pattern;Verbal cues for safe use of DME/AE;Tactile cues for sequencing;Tactile cues for weight shifting;Tactile cues for placement;Manual facilitation for weight bearing;Visual cues/gestures for precautions/safety Transfers Sit to Stand: 2: Max assist Sit to Stand Details: Tactile cues for initiation;Tactile cues for weight shifting;Tactile cues for posture;Tactile cues for placement;Visual cues for safe use of DME/AE;Visual cues/gestures for precautions/safety;Visual cues/gestures for sequencing;Verbal cues for technique;Verbal cues for sequencing;Verbal cues for precautions/safety;Verbal cues for gait pattern;Verbal cues for safe use of DME/AE;Manual facilitation for weight shifting;Manual facilitation for placement;Manual facilitation for weight bearing  Trunk/Postural Assessment  Cervical Assessment Cervical Assessment: Within Functional Limits Thoracic Assessment Thoracic Assessment: Within Functional Limits Lumbar Assessment Lumbar Assessment: Exceptions to [facility] Postural Control Postural Control: Deficits on evaluation  Balance Balance Balance Assessed: Yes Static Sitting Balance Static Sitting - Level of Assistance: 6: Modified independent (Device/Increase time) Dynamic Sitting Balance Dynamic Sitting - Level of Assistance: 5: Stand by assistance Static Standing Balance Static Standing - Level of Assistance: 2: Max assist Extremity/Trunk Assessment RUE Assessment RUE Assessment: Within Functional Limits LUE Assessment LUE Assessment: Not tested   See Function Navigator for Current Functional Status.   Refer to Care Plan for Long Term Goals  Recommendations for other services: None  Discharge Criteria: Patient will be discharged from OT if patient refuses treatment 3 consecutive times without medical  reason, if treatment goals not met, if there is a change in medical status, if patient makes no progress towards goals or if patient is discharged from hospital.  The above assessment, treatment plan, treatment alternatives and goals were discussed and mutually agreed upon: by patient  Phineas Semen 02/23/2016, 3:39 PM

## 2016-02-23 NOTE — Progress Notes (Signed)
[place] PHYSICAL MEDICINE & REHABILITATION     PROGRESS NOTE  Subjective/Complaints:  Patient sitting up at the edge of the bed this morning. He states he slept fairly overnight. He is ready to begin his first in therapies.  ROS: Denies CP, SOB, nausea, vomiting, diarrhea.  Objective: Vital Signs: Blood pressure 145/76, pulse 85, temperature 99 F (37.2 C), temperature source Oral, resp. rate 18, height 5\' 9"  (1.753 m), weight 102.785 kg (226 lb 9.6 oz), SpO2 98 %. No results found.  Recent Labs  02/22/16 0251 02/22/16 1723  WBC 8.6 8.1  HGB 9.4* 9.5*  HCT 29.5* 30.3*  PLT 307 319    Recent Labs  02/21/16 0229 02/22/16 0251 02/22/16 1723  NA 132* 134*  --   K 4.3 4.3  --   CL 107 107  --   GLUCOSE 105* 103*  --   BUN 28* 26*  --   CREATININE 1.55* 1.53* 1.72*  CALCIUM 8.3* 8.7*  --    CBG (last 3)   Recent Labs  02/21/16 2100 02/22/16 0606 02/22/16 1212  GLUCAP 123* 102* 140*    Wt Readings from Last 3 Encounters:  02/23/16 102.785 kg (226 lb 9.6 oz)  02/22/16 107.094 kg (236 lb 1.6 oz)  12/31/15 109.825 kg (242 lb 1.9 oz)    Physical Exam:  BP 145/76 mmHg  Pulse 85  Temp(Src) 99 F (37.2 C) (Oral)  Resp 18  Ht 5\' 9"  (1.753 m)  Wt 102.785 kg (226 lb 9.6 oz)  BMI 33.45 kg/m2  SpO2 98% Constitutional: He appears well-developed and well-nourished. NAD. HENT: Normocephalic and atraumatic.  Eyes: EOM and conjunctiva are normal.   Cardiovascular: Normal rate. Exam reveals no friction rub. No murmur heard. Respiratory: No respiratory distress. He has no wheezes. He has no rales.  Musculoskeletal:  Pain along both lateral hips/low back with palpation and hip flexion. No edema.  Neurological:  Diminished sensation of light touch bilateral anterior and lateral thighs, right greater than left  Motor: Bilateral upper extremities 5/5 proximal to distal Right lower extremity: Hip flexion 2/5, knee extension 4/5, ankle dorsi/plantar flexion 4+/5 Left  lower extremity: Hip flexion: 3 -/5, knee extension 4+/5, ankle dorsi/plantar flexion 4+/5 Skin: Pacemaker site C/D/I   Assessment/Plan: 1. Functional deficits secondary to cardiac arrest/CPR status post CRT-D which require 3+ hours per day of interdisciplinary therapy in a comprehensive inpatient rehab setting. Physiatrist is providing close team supervision and 24 hour management of active medical problems listed below. Physiatrist and rehab team continue to assess barriers to discharge/monitor patient progress toward functional and medical goals.  Function:  Bathing Bathing position      Bathing parts      Bathing assist        Upper Body Dressing/Undressing Upper body dressing                    Upper body assist        Lower Body Dressing/Undressing Lower body dressing                                  Lower body assist        Toileting Toileting          Toileting assist     Transfers Chair/bed transfer             Locomotion Ambulation  Wheelchair          Cognition Comprehension    Expression    Social Interaction    Problem Solving    Memory      Medical Problem List and Plan: 1. LE weakness secondary to cardiac arrest/CPR status post CRT-D 02/17/2016 and retroperitoneal hematoma -Begin CIR 2. DVT Prophylaxis/Anticoagulation: Subcutaneous heparin. Monitor platelet counts and any signs of bleeding 3. Pain Management/chronic back pain: Oxycodone as needed 4. Hypertension/ischemic cardiomyopathy. Amiodarone 400 mg daily, Coreg 25 mg daily, Lasix 40 mg daily,BIDIL 20-30 7.5 mg tablet 0.5 3 times a day.   Labile at present  Monitor with increased mobility 5. Neuropsych: This patient is capable of making decisions on his own behalf. 6. Skin/Wound Care: Routine skin checks 7. Fluids/Electrolytes/Nutrition: Routine I&O   Hyponatremia: 134 and 5/30, will continue to monitor 8. Chronic renal  insufficiency. Baseline creatinine 1.80-2.00.   Creatinine 1.72 on 5/37  Will cont to monitor 9. Systolic congestive heart failure. Continue Lasix 40 mg daily. The patient daily 10. Hypothyroidism. Synthroid. TSH 2.861 11. Retroperitoneal hemorrhage with femoral nerve involvement likely affecting the iliacus muscle and leading to sensory loss along the antero-lateral thigh. Additionally, there is hematoma overlying the psoas muscles.  -cont conservative mgt for now  -Hb 8.5 on 5/30 12. DM type 2  HbA1c 7.1 on 5/18  CBGs ordered  Will cont to monitor and initiate medications if warranted  LOS (Days) 1 A FACE TO FACE EVALUATION WAS PERFORMED  Ankit [name] 02/23/2016 8:32 AM

## 2016-02-23 NOTE — Progress Notes (Signed)
Patient may have full weightbearing to left upper extremity after recent placement of ICD placement.

## 2016-02-23 NOTE — Patient Care Conference (Signed)
Inpatient RehabilitationTeam Conference and Plan of Care Update Date: 02/23/2016   Time: 2:30 PM    Patient Name: Eric [name]      Medical Record Number: [id number]  Date of Birth: 07/25/1951 Sex: Male         Room/Bed: 4W26C/4W26C-01 Payor Info: Payor: [place] / Plan: [place] PPO / Product Type: *No Product type* /    Admitting Diagnosis: Cardiac arrest, femoral neuropathy  Admit Date/Time:  02/22/2016  4:44 PM Admission Comments: No comment available   Primary Diagnosis:  Femoral nerve injury Principal Problem: Femoral nerve injury  Patient Active Problem List   Diagnosis Date Noted  . Diabetes mellitus type 2 in obese ([place])   . Labile blood pressure   . Chronic renal insufficiency   . Acute blood loss anemia   . Diabetes mellitus type 2 in nonobese (H[facility])   . Debility 02/22/2016  . 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 Weakness of both lower extremities   . Lower extremity weakness   . Pneumonia   . Acute lumbar back pain   . Non-traumatic rhabdomyolysis   . Retroperitoneal bleed   . Back pain   . Leg weakness, bilateral   . Paroxysmal atrial fibrillation ([place])   . Ventricular fibrillation ([place]) 02/09/2016  . Aortic aneurysm without rupture ([place]) 02/09/2016  . Aortic insufficiency 02/09/2016  . HCAP (healthcare-associated pneumonia)   . AKI (acute kidney injury) ([place])   . Cardiac arrest ([place]) 02/01/2016  . Acute on chronic systolic heart failure ([place])   . Acute encephalopathy   . CKD (chronic kidney disease)   . Thyroid activity decreased   . Acute hypoxemic respiratory failure ([place])   . Acute respiratory failure ([place])   . Encounter for central line placement   . Arrhythmia   . Altered mental status   . CKD (chronic kidney disease), stage IV ([place]) 08/06/2013    Class: Chronic  . Long term (current) use of anticoagulants 08/06/2013    Class: Chronic  . Special screening for malignant neoplasms, colon 04/11/2013  .  Colon cancer screening 03/04/2013  . Chronic anticoagulation 03/04/2013  . Atrial flutter ([place]) 03/18/2012    Class: Acute  . Chronic systolic heart failure ([place]) 02/13/2012    Class: Acute  . Hypertension, accelerated 02/13/2012  . Aortic valve regurgitation, acquired 02/13/2012  . Aortic root enlargement ([place]) 02/13/2012  . Hyperlipidemia 02/13/2012  . Left bundle branch block 02/13/2012  . Obesity (BMI 30-39.9) 02/13/2012    Expected Discharge Date: Expected Discharge Date: 03/16/16  Team Members Present: Physician leading conference: Dr. 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 Mod-Max A for bed  mobility.    Min- Supervision A for gait with house hold distances WC mobility for community distances.   transfers and LE strength/endurance.    Communication             Safety/Cognition/ Behavioral Observations            Pain             Skin                *See Care Plan and progress notes for long and short-term goals.  Barriers to Discharge: Kidney function, DM (new diagnosis), labile BP, hyponatremia    Possible Resolutions to Barriers:  Follow labs, consider DM meds, optimize BP meds    Discharge Planning/Teaching Needs:    Home with wife, when wife gone at  work to get someone to stay with him. Aware he will require 24 hr supervision at discharge.     Team Discussion:  Goals-supervision/min assist level goals. New evaluations Getting movement in his left thigh. WB left arm from pacemaker-MD to look into when can advance weight bearing. Fatigues quickly.   Revisions to Treatment Plan:  New eval   Continued Need for Acute Rehabilitation Level of Care: The patient requires daily medical management by a physician with specialized training in physical medicine and rehabilitation for the following conditions: Daily direction of a multidisciplinary physical rehabilitation program to ensure safe treatment while eliciting the highest outcome that is of practical value to the patient.: Yes Daily medical management of patient stability for increased activity during participation in an intensive rehabilitation regime.: Yes Daily analysis of laboratory values and/or radiology reports with any subsequent need for medication adjustment of medical intervention for : Cardiac problems;Neurological problems;Diabetes problems;Blood pressure problems;Renal problems  [name] 02/25/2016, 1:57 PM

## 2016-02-24 ENCOUNTER — Inpatient Hospital Stay (HOSPITAL_COMMUNITY): Payer: BC Managed Care – PPO | Admitting: Physical Therapy

## 2016-02-24 ENCOUNTER — Inpatient Hospital Stay (HOSPITAL_COMMUNITY): Payer: BC Managed Care – PPO | Admitting: Occupational Therapy

## 2016-02-24 DIAGNOSIS — E669 Obesity, unspecified: Secondary | ICD-10-CM

## 2016-02-24 LAB — GLUCOSE, CAPILLARY
GLUCOSE-CAPILLARY: 142 mg/dL — AB (ref 65–99)
GLUCOSE-CAPILLARY: 104 mg/dL — AB (ref 65–99)
GLUCOSE-CAPILLARY: 130 mg/dL — AB (ref 65–99)

## 2016-02-24 NOTE — Progress Notes (Signed)
[facility] PHYSICAL MEDICINE & REHABILITATION     PROGRESS NOTE  Subjective/Complaints:  Pt laying in bed this AM.  He slept well overnight and was encouraged by his first day of therapies.    ROS: Denies CP, SOB, nausea, vomiting, diarrhea.  Objective: Vital Signs: Blood pressure 148/78, pulse 87, temperature 98.6 F (37 C), temperature source Oral, resp. rate 18, height 5\' 9"  (1.753 m), weight 103.738 kg (228 lb 11.2 oz), SpO2 100 %. No results found.  Recent Labs  02/22/16 1723 02/23/16 0919  WBC 8.1 8.3  HGB 9.5* 10.1*  HCT 30.3* 31.6*  PLT 319 289    Recent Labs  02/22/16 0251 02/22/16 1723 02/23/16 0919  NA 134*  --  135  K 4.3  --  4.1  CL 107  --  105  GLUCOSE 103*  --  159*  BUN 26*  --  24*  CREATININE 1.53* 1.72* 1.76*  CALCIUM 8.7*  --  9.2   CBG (last 3)   Recent Labs  02/21/16 2100 02/22/16 0606 02/22/16 1212  GLUCAP 123* 102* 140*    Wt Readings from Last 3 Encounters:  02/24/16 103.738 kg (228 lb 11.2 oz)  02/22/16 107.094 kg (236 lb 1.6 oz)  12/31/15 109.825 kg (242 lb 1.9 oz)    Physical Exam:  BP 148/78 mmHg  Pulse 87  Temp(Src) 98.6 F (37 C) (Oral)  Resp 18  Ht 5\' 9"  (1.753 m)  Wt 103.738 kg (228 lb 11.2 oz)  BMI 33.76 kg/m2  SpO2 100% Constitutional: He appears well-developed and well-nourished. NAD. HENT: Normocephalic and atraumatic.  Eyes: EOM and conjunctiva are normal.   Cardiovascular: Normal rate. Exam reveals no friction rub. No murmur heard. Respiratory: No respiratory distress. He has no wheezes. He has no rales.  Musculoskeletal:  Pain along both lateral hips/low back with palpation and hip flexion. No edema.  Neurological:  Diminished sensation of light touch bilateral anterior and lateral thighs, right greater than left  Motor: Bilateral upper extremities 5/5 proximal to distal Right lower extremity: Hip flexion 2/5, knee extension 4/5, ankle dorsi/plantar flexion 4+/5 Left lower extremity: Hip flexion: 3  -/5, knee extension 4+/5, ankle dorsi/plantar flexion 4+/5 Skin: Pacemaker site C/D/I   Assessment/Plan: 1. Functional deficits secondary to cardiac arrest/CPR status post CRT-D which require 3+ hours per day of interdisciplinary therapy in a comprehensive inpatient rehab setting. Physiatrist is providing close team supervision and 24 hour management of active medical problems listed below. Physiatrist and rehab team continue to assess barriers to discharge/monitor patient progress toward functional and medical goals.  Function:  Bathing Bathing position   Position: Sitting EOB  Bathing parts Body parts bathed by patient: Right arm, Left arm, Chest, Abdomen, Front perineal area, Right upper leg, Left upper leg Body parts bathed by helper: Buttocks, Right lower leg, Left lower leg, Back  Bathing assist Assist Level:  (mod A)      Upper Body Dressing/Undressing Upper body dressing   What is the patient wearing?: Hospital gown                Upper body assist Assist Level: Set up   Set up : To obtain clothing/put away  Lower Body Dressing/Undressing Lower body dressing   What is the patient wearing?: Hospital Gown, Non-skid slipper socks           Non-skid slipper socks- Performed by helper: Don/doff right sock, Don/doff left sock  Lower body assist Assist for lower body dressing: Set up      [place] assist     Transfers Chair/bed transfer   Chair/bed transfer method: Stand pivot Chair/bed transfer assist level: Maximal assist (Pt 25 - 49%/lift and lower)       Locomotion Ambulation     Max distance: 5 ft  Assist level: 2 helpers   Wheelchair   Type: Manual Max wheelchair distance: 74ft Assist Level: Touching or steadying assistance (Pt > 75%)  Cognition Comprehension Comprehension assist level: Understands complex 90% of the time/cues 10% of the time  Expression Expression assist level: Expresses  complex 90% of the time/cues < 10% of the time  Social Interaction Social Interaction assist level: Interacts appropriately 90% of the time - Needs monitoring or encouragement for participation or interaction.  Problem Solving Problem solving assist level: Solves basic problems with no assist  Memory Memory assist level: Recognizes or recalls 90% of the time/requires cueing < 10% of the time    Medical Problem List and Plan: 1. 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 DVT Prophylaxis/Anticoagulation: Subcutaneous heparin. Monitor platelet counts and any signs of bleeding 3. Pain Management/chronic back pain: Oxycodone as needed 4. Hypertension/ischemic cardiomyopathy. Amiodarone 400 mg daily, Coreg 25 mg daily, Lasix 40 mg daily,BIDIL 20-30 7.5 mg tablet 0.5 3 times a day.   Continues to be labile  Monitor with increased mobility 5. Neuropsych: This patient is capable of making decisions on his own behalf. 6. Skin/Wound Care: Routine skin checks 7. Fluids/Electrolytes/Nutrition: Routine I&O   Hyponatremia: 135 on 5/31, will continue to monitor 8. Chronic renal insufficiency. Baseline creatinine 1.80-2.00.   Creatinine 1.76 on 5/31  Will cont to monitor 9. Systolic congestive heart failure. Continue Lasix 40 mg daily. The patient daily 10. Hypothyroidism. Synthroid. TSH 2.861 11. Retroperitoneal hemorrhage with femoral nerve involvement likely affecting the iliacus muscle and leading to sensory loss along the antero-lateral thigh. Additionally, there is hematoma overlying the psoas muscles.  -cont conservative mgt for now  -Hb 10.1 on 5/31 12. DM type 2  HbA1c 7.1 on 5/18  CBGs ordered, not performed  Will cont to monitor and initiate medications if warranted 13. Obesity  Body mass index is 33.76 kg/(m^2).  Diet and exercise education  Will cont to encourage weight loss to increase endurance  and promote overall health  LOS (Days) 2 A FACE TO FACE EVALUATION WAS PERFORMED  Ankit [name] 02/24/2016 8:45 AM

## 2016-02-24 NOTE — Progress Notes (Signed)
Physical Therapy Session Note  Patient Details  Name: Eric [name] MRN: [id number] Date of Birth: Aug 17, 1951  Today's Date: 02/24/2016 PT Individual Time: 0800-0901 AND 5621-3086 PT Individual Time Calculation (min): 61 min  AND 73 min  Short Term Goals:  Week 1:  PT Short Term Goal 1 (Week 1): Patient will perform Bed mobility with min A From PT and slight use of bed features.  PT Short Term Goal 2 (Week 1): Patient will perform Squat pivot/stand pivot transfers with mod A from PT.  PT Short Term Goal 3 (Week 1): Patient will perform WC mobility with supervision A for 128f with with BLE and R UE technique.   Skilled Therapeutic Interventions/Progress Updates:    Patient received sitting EOB and agreeable to PT. Patient performed sit<>stand using stedy with mod A from PT. Patient transferred to toilet from stedy with mod A form PT. Patient uriniated on toilet, but was unable to have bowel movement. Transferred to bed in stedy with mod A for sit<>stand back to EOB.   Patient performed sit<>stand in stedy x 6 to don underwear and pants PT provided mod A for sit<>stand and min A for clothing management as well as min cues for improved use of BUE and knee stability in stance. Patient able to maintain terminal knee extension in stance on the right LE without buckling for 30 seconds to attempt to button pants while standing in stedy.   WC mobility in hall with R UE and BLE with supervision A for 1551fwith cues for improved R UE ROM and steering with BLE. Patient responded well to instruction from PT.   Sit<>stand with bariatric RW x 3 with mod A from PT. Cues for improved UE positioning and PT to block knee to prevent knee buckling with eccentric stand>sit.   [facility]    Session 2:  Patient received sitting in WC and agreeable to PT.  Patient instructed in WCComoobility with BUE propulsion for 755f 2 with supervision A with cues for  improved UE coordination improved for obstacle navigation. Patient demonstrated improved technique with increased distance.    Sit<>stand x 3 with mod A from PT from 21 ich seat height. PT provided min cues for proper UE placement with one UE on sitting surface with sit<>stand and increased use of UE for control of descent to chair.   WC<>mat stand pivot transfer with baritric RW. X 4 with mod A from PT as well as mod- max cueing for improved sequencing and AD management. Patient required max A x 1 to block knee to prevent LOB to the R due to R knee buckling.  Gait training with Bariatric RW for [id number] with min A and max A x 1 to prevent LOB due to R knee buckle. Patient noted to have decreased step length bilaterally R more impaired than L and increased R knee extension to prevent buckling. For 90% of gait.  Bed mobility for sit<>supine with mod A from PT as well as cues for improved LE positioning and attempted to bring R LE up with LLE, but decreased hip flexion strength prevented movement.   Supine therex:  BLE hip abduction. X 10 Bridges x 5 BLE PNF D1 flexion/extension with emphasis on flexion with adduction x 10  PT provided mod verbal and tactile cues for improved hip/knee control with supine therex as well as cues for decreased compensation from the trunk. Patient  responded well to instruction and able to demonstrate increased control hip internal rotators.   Patient left sitting in [facility] with wife present.      Session   Therapy Documentation Precautions:  Precautions Precautions: ICD/Pacemaker Precaution Comments: per Dr. [name] UE is WBAT as of 5/31 Restrictions Weight Bearing Restrictions: Yes LUE Weight Bearing: Non weight bearing General:   Vital Signs:  Pain: Pain Assessment Pain Assessment: 0-10 Pain Score: 3  Pain Type: Acute pain Pain Location: Leg Pain Orientation: Right Pain Descriptors / Indicators: Aching Pain Frequency: Intermittent Pain Onset:  Gradual Patients Stated Pain Goal: 2 Pain Intervention(s): Medication (See eMAR);Repositioned Multiple Pain Sites: No   See Function Navigator for Current Functional Status.   Therapy/Group: Individual Therapy  [name] 02/24/2016, 12:20 PM

## 2016-02-24 NOTE — Progress Notes (Signed)
Occupational Therapy Session Note  Patient Details  Name: Eric [name] MRN: [id number] Date of Birth: [birth year]  Today's Date: 02/24/2016 OT Individual Time: 1005-1100 OT Individual Time Calculation (min): 55 min   Short Term Goals: Week 1:  OT Short Term Goal 1 (Week 1): Pt will perform toilet transfer with mod A in order to decrease level of assist with functional transfers. OT Short Term Goal 2 (Week 1): Pt will perform shower transfer with mod A in order to decrease level of assist with functional transfers.  OT Short Term Goal 3 (Week 1): Pt will perform LB dressing with mod A in order to increase I with functional task.  OT Short Term Goal 4 (Week 1): Pt will perform toileting with mod A balance or less in order to complete clothing management safely.   Skilled Therapeutic Interventions/Progress Updates:  Patient found seated in w/c with no complaints or signs/symptoms of pain. Pt willing and eager to work with OT. Pt propelled self to sink for UB/LB bathing/dressing. Pt performed ADL in sit to/from stand position using STEDY for safety and to increase independence with sit to/from stands. Pt with no pants that fit, therefore recommended pt have wife bring in gym shorts or pajamas for easy on/off during dressing and toileting needs; pt agreed. Pt used STEDY for sit to/from stands for peri cleansing and grooming task of shaving while standing at sink. During standing, pt with increased fatigue and RLE buckling noted. Discussed goal of progressing patient so he does not have to use the STEDY for transfers and ADL in the near future. Assisted pt with donning of tennis shoes, set up shoes to increase his independence with donning/doffing them. At end of session, left pt seated in w/c with all needs within reach; pt cognitively intact and aware to call for assistance and not get up on his own.   Therapy Documentation Precautions:  Precautions Precautions: ICD/Pacemaker Precaution Comments:  per Dr. [name] UE is WBAT as of 5/31 Restrictions Weight Bearing Restrictions: Yes LUE Weight Bearing: Non weight bearing  Vital Signs: Therapy Vitals Temp: 98.6 F (37 C) Temp Source: Oral Pulse Rate: 87 Resp: 18 BP: (!) 148/78 mmHg Patient Position (if appropriate): Lying Oxygen Therapy SpO2: 100 % O2 Device: Not Delivered  See Function Navigator for Current Functional Status.  Therapy/Group: Individual Therapy  [name] , MS, OTR/L, CLT  02/24/2016, 11:37 AM

## 2016-02-25 ENCOUNTER — Inpatient Hospital Stay (HOSPITAL_COMMUNITY): Payer: BC Managed Care – PPO | Admitting: Physical Therapy

## 2016-02-25 ENCOUNTER — Inpatient Hospital Stay (HOSPITAL_COMMUNITY): Payer: BC Managed Care – PPO | Admitting: Occupational Therapy

## 2016-02-25 LAB — GLUCOSE, CAPILLARY
GLUCOSE-CAPILLARY: 112 mg/dL — AB (ref 65–99)
GLUCOSE-CAPILLARY: 158 mg/dL — AB (ref 65–99)
GLUCOSE-CAPILLARY: 124 mg/dL — AB (ref 65–99)
GLUCOSE-CAPILLARY: 157 mg/dL — AB (ref 65–99)

## 2016-02-25 NOTE — Progress Notes (Signed)
Social Work Patient ID: [name], male   DOB: [birth year], 65 y.o.   MRN: [id number] Met with pt and left message for wife to inform of goals-supervision to min assist level and target discharge 6/22. He feels it is going well and he can see the progress he is making. His wife comes in the evenings to visit, but runs the practice during the day. Will continue to work with on discharge plans, aware will have another team conference Wed and can Confirm goals and target date.

## 2016-02-25 NOTE — Progress Notes (Signed)
Physical Therapy Session Note  Patient Details  Name: Eric [name] MRN: [id number] Date of Birth: 1950/11/30  Today's Date: 02/25/2016 PT Individual Time: 0801-0900 AND 1330-1445 PT Individual Time Calculation (min): 59 min AND 75 min   Short Term Goals: Week 1:  PT Short Term Goal 1 (Week 1): Patient will perform Bed mobility with min A From PT and slight use of bed features.  PT Short Term Goal 2 (Week 1): Patient will perform Squat pivot/stand pivot transfers with mod A from PT.  PT Short Term Goal 3 (Week 1): Patient will perform WC mobility with supervision A for 171ft with with BLE and R UE technique.   Skilled Therapeutic Interventions/Progress Updates:    Patient received sitting EOB with RN present reporting that patient needed to have a bowel movement. Trade off from RN.  Sit<>stand in stedy with min A from PT PT and transported to toilet. Patient performed toilet transfer from stedy with min A A from PT with cues for Ue placement. Patient performed per hygiene with min A from PT standing in steady.  Stand<>sit to WC from stedy with min A from PT  Patinet donned underpants with set up from PT and mod A for sit<>stand with RW to pull up.   WC mobility in hall for 261ft with supervision A from PT and mod cues for improved use of BUE for enhanced obstacle navigation and doorway management.   Gait training with RW for 29ft and 73ft with min A from PT and mod A x 1 to prevent RLE knee buckle. HR 112bpm following gait training with 4 minute rest break to decrease to 100bpm  Patient performed sit<>stand to prepare for gait training x 2 with mod A from PT and min cues for proper UE placement.  Patient returned to room and left sitting in [facility] with call bell within reach.     Session 2 Patient received sitting in WC and agreeable to PT.  Patient transported to gym in [facility] for energy conservation. Stand pivot transfer performed to nustep with mod A from PT and min ccues for AD management.  Patient experienced R knee buckling on first attempt to transfer and unable to perform sit<>stand. Following RLE repositioning, patient able to complete transfer. Nustep for 2 minutes on Level 2, 6 minutes on level 5, and 2 minutes on level 2. Throughout nustep exercises for improved endurance patient encouraged to maintain SPM >35 and improve hip IR to maintain neutral LE positioning .    Gait training for 15 ft to mat table, 2 minute rest break, and 10 feet around mat table. PT provided mod A for sit<>stand to prepare for gait training and min A with giat training to prevent RLE knee buckle and improved safety. Min cues from PT for improved Ad management with turns.   LE Therex performed sitting Edge of mat table  RLE AAROM LAQ x 10, LLE  AROM LAQ x10 BLE hamstring curl against manual resistance x 10 BLE Hip abducton against manual resistance x 10; LLE tolerated less resistance than R LE BLE hip adduction against manual resistance x 10  Supine therex: Pelvic rotation Dynamic reversals x 5 BLE  Bridges with min A to stabilize BLE in neutral position x 5  Sidelying R LE knee extension in gravity eliminated position.    PT instructed patient in Stair training on 4 inch step x 5 steps with forward ascent and backward descent using BUE support on rails. PT provided mod A for  ascent and descent for improved LLE stability and to prevent the R LE from buckling. Mod cues provided for proper step to gait pattern and increased stability of the RLE.   Patient returned to room and left sitting in [facility] with call bell within reach.      Therapy Documentation Precautions:  Precautions Precautions: ICD/Pacemaker Precaution Comments: per Dr. [name] UE is WBAT as of 5/31 Restrictions Weight Bearing Restrictions: Yes LUE Weight Bearing: Non weight bearing Vital Signs: Therapy Vitals Temp: 98.3 F (36.8 C) Temp Source: Oral Pulse Rate: 92 Resp: 18 BP: 140/71 mmHg Patient Position (if appropriate):  Lying Oxygen Therapy SpO2: 100 % O2 Device: Not Delivered Pain: 0/10    See Function Navigator for Current Functional Status.   Therapy/Group: Individual Therapy  [name] 02/25/2016, 9:04 AM

## 2016-02-25 NOTE — Progress Notes (Signed)
Patient rested well throughout the night. Transferred to toilet using stedy and assist x2. Patient able to verbalize any unmet needs. No complaints noted.

## 2016-02-25 NOTE — Progress Notes (Signed)
Occupational Therapy Session Note  Patient Details  Name: Eric [name] MRN: [id number] Date of Birth: 07/07/1951  Today's Date: 02/25/2016 OT Individual Time:  -   0900-1000  (60 min)      Short Term Goals: Week 1:  OT Short Term Goal 1 (Week 1): Pt will perform toilet transfer with mod A in order to decrease level of assist with functional transfers. OT Short Term Goal 2 (Week 1): Pt will perform shower transfer with mod A in order to decrease level of assist with functional transfers.  OT Short Term Goal 3 (Week 1): Pt will perform LB dressing with mod A in order to increase I with functional task.  OT Short Term Goal 4 (Week 1): Pt will perform toileting with mod A balance or less in order to complete clothing management safely.   Skilled Therapeutic Interventions/Progress Updates:    Pt. Sitting in wc.  Agreed to shower.  Ppt propelled wc to shower area with max assist.  Pt transferred to tub transfer bench with max assist.  Pt performed lateral leans with mod assist to doff underwear.  Pt performed bathing with mod assist.  Transferred to wc with lateral scoot and mod assist and verbal cues for technique.  Pt able to do Bilateral push up  In wc for  OT to don briefs.   Called in nursing to donn ted stockings as time ran out.   Therapy Documentation Precautions:  Precautions Precautions: ICD/Pacemaker Precaution Comments: per Dr. [name] UE is WBAT as of 5/31 Restrictions Weight Bearing Restrictions: Yes LUE Weight Bearing: Non weight bearing General:   Vital Signs: Therapy Vitals Temp: 98.3 F (36.8 C) Temp Source: Oral Pulse Rate: 92 Resp: 18 BP: 140/71 mmHg Patient Position (if appropriate): Lying Oxygen Therapy SpO2: 100 % O2 Device: Not Delivered Pain:  none         See Function Navigator for Current Functional Status.   Therapy/Group: Individual Therapy  Lisa Roca 02/25/2016, 7:59 AM

## 2016-02-25 NOTE — Progress Notes (Signed)
[place] PHYSICAL MEDICINE & REHABILITATION     PROGRESS NOTE  Subjective/Complaints:  Pt sitting up at the edge of the bed this AM.  He slept well overnight.  He notes stiffness in his legs this AM.    ROS: Denies CP, SOB, nausea, vomiting, diarrhea.  Objective: Vital Signs: Blood pressure 140/71, pulse 92, temperature 98.3 F (36.8 C), temperature source Oral, resp. rate 18, height 5\' 9"  (1.753 m), weight 103.647 kg (228 lb 8 oz), SpO2 100 %. No results found.  Recent Labs  02/22/16 1723 02/23/16 0919  WBC 8.1 8.3  HGB 9.5* 10.1*  HCT 30.3* 31.6*  PLT 319 289    Recent Labs  02/22/16 1723 02/23/16 0919  NA  --  135  K  --  4.1  CL  --  105  GLUCOSE  --  159*  BUN  --  24*  CREATININE 1.72* 1.76*  CALCIUM  --  9.2   CBG (last 3)   Recent Labs  02/24/16 1648 02/24/16 2044 02/25/16 0640  GLUCAP 104* 130* 112*    Wt Readings from Last 3 Encounters:  02/25/16 103.647 kg (228 lb 8 oz)  02/22/16 107.094 kg (236 lb 1.6 oz)  12/31/15 109.825 kg (242 lb 1.9 oz)    Physical Exam:  BP 140/71 mmHg  Pulse 92  Temp(Src) 98.3 F (36.8 C) (Oral)  Resp 18  Ht 5\' 9"  (1.753 m)  Wt 103.647 kg (228 lb 8 oz)  BMI 33.73 kg/m2  SpO2 100% Constitutional: He appears well-developed and well-nourished. NAD. HENT: Normocephalic and atraumatic.  Eyes: EOM and conjunctiva are normal.   Cardiovascular: Normal rate. Exam reveals no friction rub. No murmur heard. Respiratory: No respiratory distress. He has no wheezes. He has no rales.  Musculoskeletal:  Pain along both lateral hips/low back with palpation and hip flexion. No edema.  PROM WNL. Neurological:  Diminished sensation of light touch bilateral anterior and lateral thighs, right greater than left  Motor: Bilateral upper extremities 5/5 proximal to distal Right lower extremity: Hip flexion 2/5, knee extension 4/5, ankle dorsi/plantar flexion 4+/5 Left lower extremity: Hip flexion: 3 -/5, knee extension 4+/5,  ankle dorsi/plantar flexion 4+/5 Skin: Pacemaker site C/D/I   Assessment/Plan: 1. Functional deficits secondary to cardiac arrest/CPR status post CRT-D which require 3+ hours per day of interdisciplinary therapy in a comprehensive inpatient rehab setting. Physiatrist is providing close team supervision and 24 hour management of active medical problems listed below. Physiatrist and rehab team continue to assess barriers to discharge/monitor patient progress toward functional and medical goals.  Function:  Bathing Bathing position   Position: Wheelchair/chair at sink  Bathing parts Body parts bathed by patient: Right arm, Left arm, Chest, Abdomen, Front perineal area, Right upper leg, Left upper leg, Buttocks Body parts bathed by helper: Buttocks, Right lower leg, Left lower leg, Back  Bathing assist Assist Level: Touching or steadying assistance(Pt > 75%) (sit to/from standing using STEDY)      Upper Body Dressing/Undressing Upper body dressing   What is the patient wearing?: Pull over shirt/dress     Pull over shirt/dress - Perfomed by patient: Thread/unthread right sleeve, Thread/unthread left sleeve, Put head through opening, Pull shirt over trunk          Upper body assist Assist Level: Set up   Set up : To obtain clothing/put away  Lower Body Dressing/Undressing Lower body dressing   What is the patient wearing?: Underwear, Shoes, [employer]  - Performed by patient: Thread/unthread right  underwear leg, Thread/unthread left underwear leg, Pull underwear up/down         Non-skid slipper socks- Performed by helper: Don/doff right sock, Don/doff left sock       Shoes - Performed by helper: Don/doff right shoe, Don/doff left shoe, Fasten right, Fasten left       TED Hose - Performed by helper: Don/doff right TED hose, Don/doff left TED hose  Lower body assist Assist for lower body dressing: Touching or steadying assistance (Pt > 75%)   Set up : To obtain  clothing/put away, Don/doff TED stockings  Toileting Toileting Toileting activity did not occur: No continent bowel/bladder event Toileting steps completed by patient: Performs perineal hygiene Toileting steps completed by helper: Adjust clothing prior to toileting, Adjust clothing after toileting Toileting Assistive Devices: Grab bar or rail  Toileting assist Assist level: Touching or steadying assistance (Pt.75%)   Transfers Chair/bed transfer   Chair/bed transfer method: Stand pivot Chair/bed transfer assist level: Moderate assist (Pt 50 - 74%/lift or lower) Chair/bed transfer assistive device: Armrests, Medical sales representative     Max distance: 34ft Assist level: Moderate assist (Pt 50 - 74%)   Wheelchair   Type: Manual Max wheelchair distance: 150 Assist Level: Supervision or verbal cues  Cognition Comprehension Comprehension assist level: Understands basic 90% of the time/cues < 10% of the time  Expression Expression assist level: Expresses basic 90% of the time/requires cueing < 10% of the time.  Social Interaction Social Interaction assist level: Interacts appropriately 90% of the time - Needs monitoring or encouragement for participation or interaction.  Problem Solving Problem solving assist level: Solves basic problems with no assist  Memory Memory assist level: Recognizes or recalls 90% of the time/requires cueing < 10% of the time    Medical Problem List and Plan: 1. 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 DVT Prophylaxis/Anticoagulation: Subcutaneous heparin. Monitor platelet counts and any signs of bleeding 3. Pain Management/chronic back pain: Oxycodone as needed 4. Hypertension/ischemic cardiomyopathy. Amiodarone 400 mg daily, Coreg 25 mg daily, Lasix 40 mg daily,BIDIL 20-30 7.5 mg tablet 0.5 3 times a day.   Continues to be labile, likely orthostatic, but  asymptomatic, cont to monitor 5. Neuropsych: This patient is capable of making decisions on his own behalf. 6. Skin/Wound Care: Routine skin checks 7. Fluids/Electrolytes/Nutrition: Routine I&O   Hyponatremia: 135 on 5/31, will continue to monitor  Labs ordered for Monday 8. Chronic renal insufficiency. Baseline creatinine 1.80-2.00.   Creatinine 1.76 on 5/31  Labs ordered for Monday  Will cont to monitor 9. Systolic congestive heart failure. Continue Lasix 40 mg daily. The patient daily 10. Hypothyroidism. Synthroid. TSH 2.861 11. Retroperitoneal hemorrhage with femoral nerve involvement likely affecting the iliacus muscle and leading to sensory loss along the antero-lateral thigh. Additionally, there is hematoma overlying the psoas muscles.  -cont conservative mgt for now  -Hb 10.1 on 5/31  Labs ordered for Monday 12. DM type 2  HbA1c 7.1 on 5/18  Fairly well controlled at present  Will cont to monitor and initiate medications if warranted 13. Obesity  Body mass index is 33.73 kg/(m^2).  Diet and exercise education  Will cont to encourage weight loss to increase endurance and promote overall health  LOS (Days) 3 A FACE TO FACE EVALUATION WAS PERFORMED  Eric [name] Eric [name] 02/25/2016 9:12 AM

## 2016-02-26 ENCOUNTER — Inpatient Hospital Stay (HOSPITAL_COMMUNITY): Payer: BC Managed Care – PPO | Admitting: Occupational Therapy

## 2016-02-26 ENCOUNTER — Inpatient Hospital Stay (HOSPITAL_COMMUNITY): Payer: BC Managed Care – PPO | Admitting: Physical Therapy

## 2016-02-26 LAB — GLUCOSE, CAPILLARY
GLUCOSE-CAPILLARY: 125 mg/dL — AB (ref 65–99)
GLUCOSE-CAPILLARY: 102 mg/dL — AB (ref 65–99)
Glucose-Capillary: 108 mg/dL — ABNORMAL HIGH (ref 65–99)
Glucose-Capillary: 117 mg/dL — ABNORMAL HIGH (ref 65–99)

## 2016-02-26 NOTE — Progress Notes (Signed)
Physical Therapy Session Note  Patient Details  Name: Eric [name] MRN: [id number] Date of Birth: 1950/11/21  Today's Date: 02/26/2016 PT Individual Time: 1101-1201 PT Individual Time Calculation (min): 60 min   Short Term Goals: Week 1:  PT Short Term Goal 1 (Week 1): Patient will perform Bed mobility with min A From PT and slight use of bed features.  PT Short Term Goal 2 (Week 1): Patient will perform Squat pivot/stand pivot transfers with mod A from PT.  PT Short Term Goal 3 (Week 1): Patient will perform WC mobility with supervision A for 158ft with with BLE and R UE technique.   Skilled Therapeutic Interventions/Progress Updates:    Patient received sitting in [place] with trade off from OT to finish bathing. Patient performed Upper body bathing with min A from PT at back to prevent excessive shoulder extension.   Patient transferred from stedy to [facility] with min A from PT.  Patient instructed in WC mobility for 10ft, 164ft, and 253ft in controlled envionment with supervision A and min cues for improved use of BUE to decrease turning radius. Patient responded very well to instruction from PT.   Gait training on uneven cement sidewalk for 68ftx 2 with RW and min A from PT, as well as min cues for improved step length bilaterally and to maintain COM within RW during turns to prepare for transfers. PT noted to have no knee buckling with gait on uneven surface.    Patient transported to rehab gym for energy conservation. LE seated therex with level 2 tband x 10 each: knee flexion bilaterally, hip abduction bilaterally, ankle PF bilaterally.  Seated isometric hip abduction against beach ball x 10 bilaterally AAROM hip flexion  x10 bilaterally   Nustep training for improved endurance and LE strengthening x 10 minutes on level 4 for 5 minutes and level 6 for 5 minutes. Min cues from PT for improve activation of LLE knee extensors and to maintain LLE in neutral rotation. Patient noted to have  improved hip rotation control compared to previous treatments.   Throughout treatment patient performed sit<>stand and stand pivot transfers x 6 each with mod A from PT with use of RW and PT to occasionally brace RLE to prevent buckling.   Patient returned to room and left sitting in recliner with call bell within reach.     Therapy Documentation Precautions:  Precautions Precautions: ICD/Pacemaker Precaution Comments: per Dr. [name] UE is WBAT as of 5/31 Restrictions Weight Bearing Restrictions: Yes LUE Weight Bearing: Non weight bearing General:   Vital Signs: Therapy Vitals Temp: 97.8 F (36.6 C) Temp Source: Oral Pulse Rate: 86 Resp: 18 BP: (!) 106/50 mmHg Patient Position (if appropriate): Sitting Oxygen Therapy SpO2: 100 % O2 Device: Not Delivered Pain: Pain Assessment Pain Assessment: No/denies pain  See Function Navigator for Current Functional Status.   Therapy/Group: Individual Therapy  [name] 02/26/2016, 3:37 PM

## 2016-02-26 NOTE — Progress Notes (Signed)
Physical Therapy Session Note  Patient Details  Name: Eric [name] MRN: [id number] Date of Birth: Apr 05, 1951  Today's Date: 02/26/2016 PT Individual Time: 1500-1600 PT Individual Time Calculation (min): 60 min   Short Term Goals: Week 1:  PT Short Term Goal 1 (Week 1): Patient will perform Bed mobility with min A From PT and slight use of bed features.  PT Short Term Goal 2 (Week 1): Patient will perform Squat pivot/stand pivot transfers with mod A from PT.  PT Short Term Goal 3 (Week 1): Patient will perform WC mobility with supervision A for 14ft with with BLE and R UE technique.   Skilled Therapeutic Interventions/Progress Updates:   Pt able to increase gravity eliminated hip flex and knee ext ROM following sidelying PNF and reports decreased knee buckling in standing. Pt would continue to benefit from skilled PT services to increased mobility.  Therapy Documentation Precautions:  Precautions Precautions: ICD/Pacemaker Precaution Comments: per Dr. [name] UE is WBAT as of 5/31 Restrictions Weight Bearing Restrictions: Yes LUE Weight Bearing: Non weight bearing Vital Signs: Therapy Vitals Temp: 97.8 F (36.6 C) Temp Source: Oral Pulse Rate: 86 Resp: 18 BP: (!) 106/50 mmHg Patient Position (if appropriate): Sitting Oxygen Therapy SpO2: 100 % O2 Device: Not Delivered Pain: Pain Assessment Pain Assessment: No/denies pain Mobility:  Pt performs transfers with Mod A with cues for weight shift and technique Locomotion :   Pt performs gait with RW with Mod A with cues for sequencing, weight shift, and safety Other Treatments:   Sidelying PNF of pelvis into anterior elevation, posterior depression with and without LE for 30'. Pt perform transfers x5 in session. Pt educated on rehab plan, safety in mobility. Gait trials x3 in session.  See Function Navigator for Current Functional Status.   Therapy/Group: Individual Therapy  Monia Pouch 02/26/2016, 3:19 PM

## 2016-02-26 NOTE — Progress Notes (Signed)
Occupational Therapy Session Note  Patient Details  Name: [name] MRN: [id number] Date of Birth: [birth year]  Today's Date: 02/26/2016 OT Individual Time: 1320-1400 OT Individual Time Calculation (min): 40 min    Short Term Goals: Week 1:  OT Short Term Goal 1 (Week 1): Pt will perform toilet transfer with mod A in order to decrease level of assist with functional transfers. OT Short Term Goal 2 (Week 1): Pt will perform shower transfer with mod A in order to decrease level of assist with functional transfers.  OT Short Term Goal 3 (Week 1): Pt will perform LB dressing with mod A in order to increase I with functional task.  OT Short Term Goal 4 (Week 1): Pt will perform toileting with mod A balance or less in order to complete clothing management safely.   Skilled Therapeutic Interventions/Progress Updates: This session, [name] expressed that he has decreased sensation between his right hip flexor down to his right knee.   Most of this session was spent facilitating and activating proprioception and muscle strength in his right thigh in order to help in crease his transfers, standing balance, self care, and mobility stronger and safer without right knee buckles.   He had no knee buckles during either session today with this clincian     Therapy Documentation    Precautions:  Precautions Precautions: ICD/Pacemaker Precaution Comments: per Dr. [name] UE is WBAT as of 5/31 Restrictions Weight Bearing Restrictions: Yes LUE Weight Bearing: Non weight bearing  Pain: Pain Assessment Pain Assessment: No/denies pain  See Function Navigator for Current Functional Status.   Therapy/Group: Individual Therapy  Alfredia Ferguson [facility] 02/26/2016, 7:07 PM

## 2016-02-26 NOTE — Progress Notes (Signed)
[place] PHYSICAL MEDICINE & REHABILITATION     PROGRESS NOTE  Subjective/Complaints:  Patient seen lying comfortably in bed this morning. He notes improvement in strength.  ROS: Denies CP, SOB, nausea, vomiting, diarrhea.  Objective: Vital Signs: Blood pressure 106/50, pulse 86, temperature 97.8 F (36.6 C), temperature source Oral, resp. rate 18, height 5\' 9"  (1.753 m), weight 103.68 kg (228 lb 9.2 oz), SpO2 100 %. No results found. No results for input(s): WBC, HGB, HCT, PLT in the last 72 hours. No results for input(s): NA, K, CL, GLUCOSE, BUN, CREATININE, CALCIUM in the last 72 hours.  Invalid input(s): CO CBG (last 3)   Recent Labs  02/25/16 2114 02/26/16 0724 02/26/16 1242  GLUCAP 157* 125* 108*    Wt Readings from Last 3 Encounters:  02/26/16 103.68 kg (228 lb 9.2 oz)  02/22/16 107.094 kg (236 lb 1.6 oz)  12/31/15 109.825 kg (242 lb 1.9 oz)    Physical Exam:  BP 106/50 mmHg  Pulse 86  Temp(Src) 97.8 F (36.6 C) (Oral)  Resp 18  Ht 5\' 9"  (1.753 m)  Wt 103.68 kg (228 lb 9.2 oz)  BMI 33.74 kg/m2  SpO2 100% Constitutional: He appears well-developed and well-nourished. NAD. HENT: Normocephalic and atraumatic.  Eyes: EOM and conjunctiva are normal.   Cardiovascular: Normal rate. Exam reveals no friction rub. No murmur heard. Respiratory: No respiratory distress. He has no wheezes. He has no rales.  Musculoskeletal:  Pain along both lateral hips/low back with palpation and hip flexion. No edema.  Neurological:  Diminished sensation of light touch bilateral anterior and lateral thighs, right greater than left (Improving) Motor: Bilateral upper extremities 5/5 proximal to distal Right lower extremity: Hip flexion 2/5 (improving), knee extension 4/5, ankle dorsi/plantar flexion 4+/5 Left lower extremity: Hip flexion: 3 -/5, knee extension 4+/5, ankle dorsi/plantar flexion 4+/5 Skin: Pacemaker site C/D/I   Assessment/Plan: 1. Functional deficits secondary  to cardiac arrest/CPR status post CRT-D which require 3+ hours per day of interdisciplinary therapy in a comprehensive inpatient rehab setting. Physiatrist is providing close team supervision and 24 hour management of active medical problems listed below. Physiatrist and rehab team continue to assess barriers to discharge/monitor patient progress toward functional and medical goals.  Function:  Bathing Bathing position   Position: Wheelchair/chair at sink  Bathing parts Body parts bathed by patient: Right arm, Left arm, Chest, Abdomen, Front perineal area, Right upper leg, Left upper leg Body parts bathed by helper: Buttocks, Right lower leg, Left lower leg, Back  Bathing assist Assist Level: Touching or steadying assistance(Pt > 75%)      Upper Body Dressing/Undressing Upper body dressing   What is the patient wearing?: Hospital gown     Pull over shirt/dress - Perfomed by patient: Thread/unthread right sleeve, Thread/unthread left sleeve, Put head through opening Pull over shirt/dress - Perfomed by helper: Pull shirt over trunk        Upper body assist Assist Level: Touching or steadying assistance(Pt > 75%)   Set up : To obtain clothing/put away  Lower Body Dressing/Undressing Lower body dressing   What is the patient wearing?: Underwear, Shoes, [employer]  - Performed by patient: Thread/unthread right underwear leg, Thread/unthread left underwear leg, Pull underwear up/down Underwear - Performed by helper: Pull underwear up/down, Thread/unthread left underwear leg, Thread/unthread right underwear leg       Non-skid slipper socks- Performed by helper: Don/doff right sock, Don/doff left sock       Shoes - Performed by helper: Don/doff right  shoe, Don/doff left shoe, Fasten right, Fasten left       TED Hose - Performed by helper: Don/doff right TED hose, Don/doff left TED hose  Lower body assist Assist for lower body dressing: Touching or steadying assistance (Pt >  75%)   Set up : To obtain clothing/put away, Don/doff TED stockings  Toileting Toileting Toileting activity did not occur: No continent bowel/bladder event Toileting steps completed by patient: Performs perineal hygiene Toileting steps completed by helper: Adjust clothing prior to toileting, Adjust clothing after toileting Toileting Assistive Devices: Grab bar or rail  Toileting assist Assist level: Touching or steadying assistance (Pt.75%)   Transfers Chair/bed transfer   Chair/bed transfer method: Squat pivot Chair/bed transfer assist level: Moderate assist (Pt 50 - 74%/lift or lower) Chair/bed transfer assistive device: Armrests, Medical sales representative     Max distance: 21ft Assist level: Touching or steadying assistance (Pt > 75%)   Wheelchair   Type: Manual Max wheelchair distance: 234ft Assist Level: Supervision or verbal cues  Cognition Comprehension Comprehension assist level: Understands complex 90% of the time/cues 10% of the time  Expression Expression assist level: Expresses complex 90% of the time/cues < 10% of the time  Social Interaction Social Interaction assist level: Interacts appropriately 90% of the time - Needs monitoring or encouragement for participation or interaction.  Problem Solving Problem solving assist level: Solves basic problems with no assist  Memory Memory assist level: Recognizes or recalls 90% of the time/requires cueing < 10% of the time    Medical Problem List and Plan: 1. 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 DVT Prophylaxis/Anticoagulation: Subcutaneous heparin. Monitor platelet counts and any signs of bleeding 3. Pain Management/chronic back pain: Oxycodone as needed 4. Hypertension/ischemic cardiomyopathy. Amiodarone 400 mg daily, Coreg 25 mg daily, Lasix 40 mg daily,BIDIL 20-30 7.5 mg tablet 0.5 3 times a day.   Continues to be  labile  Monitor with increased mobility 5. Neuropsych: This patient is capable of making decisions on his own behalf. 6. Skin/Wound Care: Routine skin checks 7. Fluids/Electrolytes/Nutrition: Routine I&O   Hyponatremia: 135 on 5/31, will continue to monitor 8. Chronic renal insufficiency. Baseline creatinine 1.80-2.00.   Creatinine 1.76 on 5/31  Will cont to monitor 9. Systolic congestive heart failure. Continue Lasix 40 mg daily. The patient daily 10. Hypothyroidism. Synthroid. TSH 2.861 11. Retroperitoneal hemorrhage with femoral nerve involvement likely affecting the iliacus muscle and leading to sensory loss along the antero-lateral thigh. Additionally, there is hematoma overlying the psoas muscles.  -cont conservative mgt for now  -Hb 10.1 on 5/31  -Will continue to monitor 12. DM type 2  HbA1c 7.1 on 5/18  CBGs ordered, not performed  Will cont to monitor and initiate medications if warranted 13. Obesity  Body mass index is 33.74 kg/(m^2).  Diet and exercise education  Will cont to encourage weight loss to increase endurance and promote overall health  LOS (Days) 4 A FACE TO FACE EVALUATION WAS PERFORMED  Eric [name] 02/26/2016 4:07 PM

## 2016-02-26 NOTE — Progress Notes (Signed)
Occupational Therapy Session Note  Patient Details  Name: Eric [name] MRN: [id number] Date of Birth: [birth year]  Today's Date: 02/26/2016 OT Individual Time: 1320-1400 OT Individual Time Calculation (min): 40 min    Short Term Goals: Week 1:  OT Short Term Goal 1 (Week 1): Pt will perform toilet transfer with mod A in order to decrease level of assist with functional transfers. OT Short Term Goal 2 (Week 1): Pt will perform shower transfer with mod A in order to decrease level of assist with functional transfers.  OT Short Term Goal 3 (Week 1): Pt will perform LB dressing with mod A in order to increase I with functional task.  OT Short Term Goal 4 (Week 1): Pt will perform toileting with mod A balance or less in order to complete clothing management safely.   Skilled Therapeutic Interventions/Progress Updates: Thought scheduled for a short session, [name] preferred to work on bathing and dressing since he had not done so prior in the morning with the nursing staff.  He transferred via [place] to sit at sink for upper body and lower body bathing and dressing.  Though lower body dressing was somewhat limited in the [place] (he preferred to sit high in the [place] today versus take up more to transfer into the w/c.   As well, this provided more opportunity for him to stand in stedy for bathing periarea without fear of right knee buckle).  He completed his bathing and dressing session with hand off to physical therapy.     Therapy Documentation Precautions:  Precautions Precautions: ICD/Pacemaker Precaution Comments: per Dr. [name] UE is WBAT as of 5/31 Restrictions Weight Bearing Restrictions: Yes LUE Weight Bearing: Non weight bearing  Pain: Pain Assessment Pain Assessment: No/denies pain See Function Navigator for Current Functional Status.   Therapy/Group: Individual Therapy  Alfredia Ferguson [facility] 02/26/2016, 6:34 PM

## 2016-02-27 ENCOUNTER — Inpatient Hospital Stay (HOSPITAL_COMMUNITY): Payer: BC Managed Care – PPO | Admitting: Physical Therapy

## 2016-02-27 LAB — GLUCOSE, CAPILLARY
GLUCOSE-CAPILLARY: 129 mg/dL — AB (ref 65–99)
GLUCOSE-CAPILLARY: 114 mg/dL — AB (ref 65–99)
Glucose-Capillary: 114 mg/dL — ABNORMAL HIGH (ref 65–99)
Glucose-Capillary: 111 mg/dL — ABNORMAL HIGH (ref 65–99)

## 2016-02-27 NOTE — Progress Notes (Signed)
[place] PHYSICAL MEDICINE & REHABILITATION     PROGRESS NOTE  Subjective/Complaints:  Pt seen laying comfortably in bed.  He slept fairly overnight.  He has questions about his defibrillator.  He notes improvement in pain with excercises.    ROS: Denies CP, SOB, nausea, vomiting, diarrhea.  Objective: Vital Signs: Blood pressure 109/66, pulse 106, temperature 98 F (36.7 C), temperature source Oral, resp. rate 18, height 5\' 9"  (1.753 m), weight 104.01 kg (229 lb 4.8 oz), SpO2 100 %. No results found. No results for input(s): WBC, HGB, HCT, PLT in the last 72 hours. No results for input(s): NA, K, CL, GLUCOSE, BUN, CREATININE, CALCIUM in the last 72 hours.  Invalid input(s): CO CBG (last 3)   Recent Labs  02/26/16 2220 02/27/16 0705 02/27/16 1140  GLUCAP 102* 114* 111*    Wt Readings from Last 3 Encounters:  02/27/16 104.01 kg (229 lb 4.8 oz)  02/22/16 107.094 kg (236 lb 1.6 oz)  12/31/15 109.825 kg (242 lb 1.9 oz)    Physical Exam:  BP 109/66 mmHg  Pulse 106  Temp(Src) 98 F (36.7 C) (Oral)  Resp 18  Ht 5\' 9"  (1.753 m)  Wt 104.01 kg (229 lb 4.8 oz)  BMI 33.85 kg/m2  SpO2 100% Constitutional: He appears well-developed and well-nourished. NAD. HENT: Normocephalic and atraumatic.  Eyes: EOM and conjunctiva are normal.   Cardiovascular: Normal rate. Exam reveals no friction rub. No murmur heard. Respiratory: No respiratory distress. He has no wheezes. He has no rales.  Musculoskeletal:  Pain along both lateral hips/low back with palpation and hip flexion, improved.  No edema.  Neurological:  Diminished sensation to light touch bilateral anterior and lateral thighs, right greater than left (Improving) Motor: Bilateral upper extremities 5/5 proximal to distal Right lower extremity: Hip flexion 2/5 (improving), knee extension 4/5, ankle dorsi/plantar flexion 4+/5 Left lower extremity: Hip flexion: 3 -/5, knee extension 4+/5, ankle dorsi/plantar flexion  4+/5 Skin: Pacemaker site C/D/I   Assessment/Plan: 1. Functional deficits secondary to cardiac arrest/CPR status post CRT-D which require 3+ hours per day of interdisciplinary therapy in a comprehensive inpatient rehab setting. Physiatrist is providing close team supervision and 24 hour management of active medical problems listed below. Physiatrist and rehab team continue to assess barriers to discharge/monitor patient progress toward functional and medical goals.  Function:  Bathing Bathing position   Position: Wheelchair/chair at sink  Bathing parts Body parts bathed by patient: Right arm, Left arm, Chest, Abdomen, Front perineal area, Right upper leg, Left upper leg Body parts bathed by helper: Buttocks, Right lower leg, Left lower leg, Back  Bathing assist Assist Level: Touching or steadying assistance(Pt > 75%)      Upper Body Dressing/Undressing Upper body dressing   What is the patient wearing?: Pull over shirt/dress     Pull over shirt/dress - Perfomed by patient: Thread/unthread right sleeve, Thread/unthread left sleeve, Put head through opening, Pull shirt over trunk Pull over shirt/dress - Perfomed by helper: Pull shirt over trunk        Upper body assist Assist Level: Set up   Set up : To obtain clothing/put away  Lower Body Dressing/Undressing Lower body dressing   What is the patient wearing?: Underwear, Shoes, [employer]  - Performed by patient: Pull underwear up/down, Thread/unthread left underwear leg, Thread/unthread right underwear leg Underwear - Performed by helper: Pull underwear up/down, Thread/unthread left underwear leg, Thread/unthread right underwear leg       Non-skid slipper socks- Performed by helper: Don/doff  right sock, Don/doff left sock       Shoes - Performed by helper: Don/doff right shoe, Don/doff left shoe, Fasten right, Fasten left       TED Hose - Performed by helper: Don/doff left TED hose, Don/doff right TED hose  Lower  body assist Assist for lower body dressing: Touching or steadying assistance (Pt > 75%)   Set up : To obtain clothing/put away, Don/doff TED stockings  Toileting Toileting Toileting activity did not occur: No continent bowel/bladder event Toileting steps completed by patient: Adjust clothing prior to toileting, Performs perineal hygiene, Adjust clothing after toileting Toileting steps completed by helper: Adjust clothing after toileting Toileting Assistive Devices: Grab bar or rail  Toileting assist Assist level: Touching or steadying assistance (Pt.75%)   Transfers Chair/bed transfer   Chair/bed transfer method: Squat pivot Chair/bed transfer assist level: Moderate assist (Pt 50 - 74%/lift or lower) Chair/bed transfer assistive device: Armrests, Medical sales representative     Max distance: 40ft Assist level: Touching or steadying assistance (Pt > 75%)   Wheelchair   Type: Manual Max wheelchair distance: 214ft Assist Level: Supervision or verbal cues  Cognition Comprehension Comprehension assist level: Follows complex conversation/direction with no assist  Expression Expression assist level: Expresses complex ideas: With no assist  Social Interaction Social Interaction assist level: Interacts appropriately 90% of the time - Needs monitoring or encouragement for participation or interaction.  Problem Solving Problem solving assist level: Solves complex problems: With extra time  Memory Memory assist level: Recognizes or recalls 90% of the time/requires cueing < 10% of the time    Medical Problem List and Plan: 1. 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 DVT Prophylaxis/Anticoagulation: Subcutaneous heparin. Monitor platelet counts and any signs of bleeding 3. Pain Management/chronic back pain: Oxycodone as needed 4. Hypertension/ischemic cardiomyopathy. Amiodarone 400 mg daily, Coreg  25 mg daily, Lasix 40 mg daily,BIDIL 20-30 7.5 mg tablet 0.5 3 times a day.   Continues to be labile, pt asyptomatic  Monitor with increased mobility 5. Neuropsych: This patient is capable of making decisions on his own behalf. 6. Skin/Wound Care: Routine skin checks 7. Fluids/Electrolytes/Nutrition: Routine I&O   Hyponatremia: 135 on 5/31, will continue to monitor  Labs ordered for tomorrow 8. Chronic renal insufficiency. Baseline creatinine 1.80-2.00.   Creatinine 1.76 on 5/31  Labs ordered for tomorrow 9. Systolic congestive heart failure. Continue Lasix 40 mg daily. The patient daily 10. Hypothyroidism. Synthroid. TSH 2.861 11. Retroperitoneal hemorrhage with femoral nerve involvement likely affecting the iliacus muscle and leading to sensory loss along the antero-lateral thigh. Additionally, there is hematoma overlying the psoas muscles.  -cont conservative mgt for now  -Hb 10.1 on 5/31  -Labs ordered for tomorrow 12. DM type 2  HbA1c 7.1 on 5/18  CBGs ordered, not performed  Will cont to monitor and initiate medications if warranted 13. Obesity  Body mass index is 33.85 kg/(m^2).  Diet and exercise education  Will cont to encourage weight loss to increase endurance and promote overall health  LOS (Days) 5 A FACE TO FACE EVALUATION WAS PERFORMED  Ankit [name] 02/27/2016 2:47 PM

## 2016-02-27 NOTE — Progress Notes (Signed)
Physical Therapy Session Note  Patient Details  Name: Eric [name] MRN: [id number] Date of Birth: 1951-04-16  Today's Date: 02/27/2016 PT Individual Time: [id number] PT Individual Time Calculation (min): 45 min   Short Term Goals: Week 1:  PT Short Term Goal 1 (Week 1): Patient will perform Bed mobility with min A From PT and slight use of bed features.  PT Short Term Goal 2 (Week 1): Patient will perform Squat pivot/stand pivot transfers with mod A from PT.  PT Short Term Goal 3 (Week 1): Patient will perform WC mobility with supervision A for 160ft with with BLE and R UE technique.   Skilled Therapeutic Interventions/Progress Updates:    Pt received in w/c & agreeable to PT, noting 3/10 pain in R knee but being premedicated. W/c propulsion x 180 ft with BUE & Min A for turns & supervision for straight path; activity for cardiovascular endurance training.  Pt able to complete sit<>stand transfers with Min A demonstrating weak hip flexors & extensors & heavy reliance on BUE. Gait training x 60 ft + 100 ft with RW & min A, no visible R knee buckling noted but pt with step-to gait pattern & decreased step length BLE. Utilized cybex kinetron ranging from 70-80 cm/sec in sitting for BLE strengthening & endurance training with pt able to tolerate up to 2 minute bouts & reporting working at [employer]" intensity. Pt completed stand pivot kinetron>w/c & experienced 1 episode R knee buckling requiring min/mod A for stability. Pt self propelled w/c x 180 ft with BUE back to room & at end of session pt left in w/c with all needs within reach.   Therapy Documentation Precautions:  Precautions Precautions: ICD/Pacemaker Precaution Comments: per Dr. [name] UE is WBAT as of 5/31 Restrictions Weight Bearing Restrictions: Yes LUE Weight Bearing: Weight bearing as tolerated  Pain: Pain Assessment Pain Assessment: 0-10 Pain Score: 3  Pain Location: Knee Pain Orientation: Right Pain Descriptors /  Indicators: Aching Pain Intervention(s): Ambulation/increased activity (pt reports he has been pre-medicated)   See Function Navigator for Current Functional Status.   Therapy/Group: Individual Therapy  [name] 02/27/2016, 12:30 PM

## 2016-02-28 ENCOUNTER — Ambulatory Visit: Payer: BC Managed Care – PPO

## 2016-02-28 ENCOUNTER — Inpatient Hospital Stay (HOSPITAL_COMMUNITY): Payer: BC Managed Care – PPO | Admitting: Physical Therapy

## 2016-02-28 ENCOUNTER — Inpatient Hospital Stay (HOSPITAL_COMMUNITY): Payer: BC Managed Care – PPO | Admitting: Occupational Therapy

## 2016-02-28 DIAGNOSIS — M25561 Pain in right knee: Secondary | ICD-10-CM

## 2016-02-28 LAB — CBC WITH DIFFERENTIAL/PLATELET
BASOS ABS: 0 10*3/uL (ref 0.0–0.1)
BASOS PCT: 0 %
Eosinophils Absolute: 0.1 10*3/uL (ref 0.0–0.7)
Eosinophils Relative: 1 %
HCT: 29 % — ABNORMAL LOW (ref 39.0–52.0)
Hemoglobin: 9.2 g/dL — ABNORMAL LOW (ref 13.0–17.0)
LYMPHS PCT: 17 %
Lymphs Abs: 1.1 10*3/uL (ref 0.7–4.0)
MCH: 29.9 pg (ref 26.0–34.0)
MCHC: 31.7 g/dL (ref 30.0–36.0)
MCV: 94.2 fL (ref 78.0–100.0)
MONOS PCT: 17 %
Monocytes Absolute: 1.1 10*3/uL — ABNORMAL HIGH (ref 0.1–1.0)
NEUTROS ABS: 4.3 10*3/uL (ref 1.7–7.7)
NEUTROS PCT: 65 %
PLATELETS: 192 10*3/uL (ref 150–400)
RBC: 3.08 MIL/uL — AB (ref 4.22–5.81)
RDW: 15.9 % — ABNORMAL HIGH (ref 11.5–15.5)
WBC: 6.6 10*3/uL (ref 4.0–10.5)

## 2016-02-28 LAB — BASIC METABOLIC PANEL
ANION GAP: 6 (ref 5–15)
BUN: 21 mg/dL — ABNORMAL HIGH (ref 6–20)
CALCIUM: 8.9 mg/dL (ref 8.9–10.3)
CHLORIDE: 107 mmol/L (ref 101–111)
CO2: 22 mmol/L (ref 22–32)
Creatinine, Ser: 1.54 mg/dL — ABNORMAL HIGH (ref 0.61–1.24)
GFR calc non Af Amer: 46 mL/min — ABNORMAL LOW (ref >60)
GFR, EST AFRICAN AMERICAN: 53 mL/min — AB (ref >60)
Glucose, Bld: 106 mg/dL — ABNORMAL HIGH (ref 65–99)
POTASSIUM: 3.7 mmol/L (ref 3.5–5.1)
Sodium: 135 mmol/L (ref 135–145)

## 2016-02-28 LAB — GLUCOSE, CAPILLARY
GLUCOSE-CAPILLARY: 104 mg/dL — AB (ref 65–99)
GLUCOSE-CAPILLARY: 96 mg/dL (ref 65–99)
Glucose-Capillary: 107 mg/dL — ABNORMAL HIGH (ref 65–99)
Glucose-Capillary: 133 mg/dL — ABNORMAL HIGH (ref 65–99)

## 2016-02-28 MED ORDER — NEOMYCIN-POLYMYXIN-PRAMOXINE 1 % EX CREA
TOPICAL_CREAM | Freq: Two times a day (BID) | CUTANEOUS | Status: DC
  Filled 2016-02-28 (×2): qty 28

## 2016-02-28 MED ORDER — BACITRACIN-NEOMYCIN-POLYMYXIN OINTMENT TUBE
TOPICAL_OINTMENT | Freq: Two times a day (BID) | CUTANEOUS | Status: DC
  Administered 2016-02-28 – 2016-03-10 (×21): via TOPICAL
  Filled 2016-02-28 (×2): qty 15

## 2016-02-28 MED ORDER — DICLOFENAC SODIUM 1 % TD GEL
2.0000 g | Freq: Four times a day (QID) | TRANSDERMAL | Status: DC
  Administered 2016-02-28 – 2016-03-10 (×41): 2 g via TOPICAL
  Filled 2016-02-28: qty 100

## 2016-02-28 NOTE — Progress Notes (Addendum)
Eric [name] PHYSICAL MEDICINE & REHABILITATION     PROGRESS NOTE  Subjective/Complaints:  Pt seen sitting up in his chair.  He enjoyed watching the basketball game last night.    ROS: Denies CP, SOB, nausea, vomiting, diarrhea.  Objective: Vital Signs: Blood pressure 115/66, pulse 94, temperature 98.9 F (37.2 C), temperature source Oral, resp. rate 18, height 5\' 9"  (1.753 m), weight 102.059 kg (225 lb), SpO2 98 %. No results found.  Recent Labs  02/28/16 0651  WBC 6.6  HGB 9.2*  HCT 29.0*  PLT 192    Recent Labs  02/28/16 0651  NA 135  K 3.7  CL 107  GLUCOSE 106*  BUN 21*  CREATININE 1.54*  CALCIUM 8.9   CBG (last 3)   Recent Labs  02/27/16 1140 02/27/16 1637 02/27/16 2142  GLUCAP 111* 129* 114*    Wt Readings from Last 3 Encounters:  02/28/16 102.059 kg (225 lb)  02/22/16 107.094 kg (236 lb 1.6 oz)  12/31/15 109.825 kg (242 lb 1.9 oz)    Physical Exam:  BP 115/66 mmHg  Pulse 94  Temp(Src) 98.9 F (37.2 C) (Oral)  Resp 18  Ht 5\' 9"  (1.753 m)  Wt 102.059 kg (225 lb)  BMI 33.21 kg/m2  SpO2 98% Constitutional: He appears well-developed and well-nourished. NAD. HENT: Normocephalic and atraumatic.  Eyes: EOM and conjunctiva are normal.   Cardiovascular: Normal rate. Exam reveals no friction rub. No murmur heard. Respiratory: No respiratory distress. He has no wheezes. He has no rales.  Musculoskeletal:  Pain along both lateral hips/low back with palpation and hip flexion, improved.  No edema.  Neurological:  Diminished sensation to light touch bilateral anterior and lateral thighs, right greater than left (Improving) Motor: Bilateral upper extremities 5/5 proximal to distal Right lower extremity: Hip flexion 2/5 (improving), knee extension 4/5, ankle dorsi/plantar flexion 4+/5 Left lower extremity: Hip flexion: 3 -/5, knee extension 4+/5, ankle dorsi/plantar flexion 4+/5 Skin: Pacemaker site C/D/I   Assessment/Plan: 1. Functional deficits  secondary to cardiac arrest/CPR status post CRT-D which require 3+ hours per day of interdisciplinary therapy in a comprehensive inpatient rehab setting. Physiatrist is providing close team supervision and 24 hour management of active medical problems listed below. Physiatrist and rehab team continue to assess barriers to discharge/monitor patient progress toward functional and medical goals.  Function:  Bathing Bathing position   Position: Wheelchair/chair at sink  Bathing parts Body parts bathed by patient: Right arm, Left arm, Chest, Abdomen, Front perineal area, Right upper leg, Left upper leg Body parts bathed by helper: Buttocks, Right lower leg, Left lower leg, Back  Bathing assist Assist Level: Touching or steadying assistance(Pt > 75%)      Upper Body Dressing/Undressing Upper body dressing   What is the patient wearing?: Pull over shirt/dress     Pull over shirt/dress - Perfomed by patient: Thread/unthread right sleeve, Thread/unthread left sleeve, Put head through opening, Pull shirt over trunk Pull over shirt/dress - Perfomed by helper: Pull shirt over trunk        Upper body assist Assist Level: Set up   Set up : To obtain clothing/put away  Lower Body Dressing/Undressing Lower body dressing   What is the patient wearing?: Underwear, Shoes, [employer]  - Performed by patient: Pull underwear up/down, Thread/unthread left underwear leg, Thread/unthread right underwear leg Underwear - Performed by helper: Pull underwear up/down, Thread/unthread left underwear leg, Thread/unthread right underwear leg       Non-skid slipper socks- Performed by helper: Don/doff  right sock, Don/doff left sock       Shoes - Performed by helper: Don/doff right shoe, Don/doff left shoe, Fasten right, Fasten left       TED Hose - Performed by helper: Don/doff left TED hose, Don/doff right TED hose  Lower body assist Assist for lower body dressing: Touching or steadying assistance  (Pt > 75%)   Set up : To obtain clothing/put away, Don/doff TED stockings  Toileting Toileting Toileting activity did not occur: No continent bowel/bladder event Toileting steps completed by patient: Adjust clothing prior to toileting, Performs perineal hygiene, Adjust clothing after toileting Toileting steps completed by helper: Adjust clothing after toileting Toileting Assistive Devices: Grab bar or rail  Toileting assist Assist level: Touching or steadying assistance (Pt.75%)   Transfers Chair/bed transfer   Chair/bed transfer method: Squat pivot Chair/bed transfer assist level: Moderate assist (Pt 50 - 74%/lift or lower) Chair/bed transfer assistive device: Armrests, Medical sales representative     Max distance: 100 ft Assist level: Touching or steadying assistance (Pt > 75%)   Wheelchair   Type: Manual Max wheelchair distance: 180 ft Assist Level: Touching or steadying assistance (Pt > 75%)  Cognition Comprehension Comprehension assist level: Follows complex conversation/direction with no assist  Expression Expression assist level: Expresses complex ideas: With no assist  Social Interaction Social Interaction assist level: Interacts appropriately 90% of the time - Needs monitoring or encouragement for participation or interaction.  Problem Solving Problem solving assist level: Solves complex problems: With extra time  Memory Memory assist level: Recognizes or recalls 90% of the time/requires cueing < 10% of the time    Medical Problem List and Plan: 1. 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 DVT Prophylaxis/Anticoagulation: Subcutaneous heparin. Monitor platelet counts and any signs of bleeding 3. Pain Management/chronic back pain: Oxycodone as needed  Voltaren gel for right knee  4. Hypertension/ischemic cardiomyopathy. Amiodarone 400 mg daily, Coreg 25 mg daily, Lasix 40 mg  daily,BIDIL 20-30 7.5 mg tablet 0.5 3 times a day.   Continues to be labile, pt asyptomatic  Monitor with increased mobility 5. Neuropsych: This patient is capable of making decisions on his own behalf. 6. Skin/Wound Care: Routine skin checks 7. Fluids/Electrolytes/Nutrition: Routine I&O   Hyponatremia: 135 on 6/5, will continue to monitor 8. Chronic renal insufficiency. Baseline creatinine 1.80-2.00.   Creatinine 1.54 on 6/5 9. Systolic congestive heart failure. Continue Lasix 40 mg daily. The patient daily 10. Hypothyroidism. Synthroid. TSH 2.861 11. Retroperitoneal hemorrhage with femoral nerve involvement likely affecting the iliacus muscle and leading to sensory loss along the antero-lateral thigh. Additionally, there is hematoma overlying the psoas muscles.  -cont conservative mgt for now  -Hb 9.2 on 6/5 12. DM type 2  HbA1c 7.1 on 5/18  Fairly controleld  Will cont to monitor and initiate medications if warranted 13. Obesity  Body mass index is 33.21 kg/(m^2).  Diet and exercise education  Will cont to encourage weight loss to increase endurance and promote overall health  LOS (Days) 6 A FACE TO FACE EVALUATION WAS PERFORMED  Eric [name] [name] 02/28/2016 8:46 AM

## 2016-02-28 NOTE — Progress Notes (Signed)
Occupational Therapy Session Note  Patient Details  Name: Eric [name] MRN: [id number] Date of Birth: 06-15-1951  Today's Date: 02/28/2016 OT Individual Time: 0810-0908 OT Individual Time Calculation (min): 58 min   Short Term Goals: Week 1:  OT Short Term Goal 1 (Week 1): Pt will perform toilet transfer with mod A in order to decrease level of assist with functional transfers. OT Short Term Goal 2 (Week 1): Pt will perform shower transfer with mod A in order to decrease level of assist with functional transfers.  OT Short Term Goal 3 (Week 1): Pt will perform LB dressing with mod A in order to increase I with functional task.  OT Short Term Goal 4 (Week 1): Pt will perform toileting with mod A balance or less in order to complete clothing management safely.   Skilled Therapeutic Interventions/Progress Updates:  Patient found seated in w/c finishing breakfast upon OT entering room. Pt with no complaints of pain. Pt refused shower this morning as he said he was too cold, therapist encouraged maybe planning to shower every other day. Pt propelled self to sink and therapist assisted pt with adjusting self in front of sink while seated in w/c. From here, pt completed LB bathing and dressing (therapist donning TEDs). Pt with request to use BR, attempted squat pivot transfer w/c to [facility] over toilet seat, but due to weakness in RLE, unable to safely accomplish with just one person assisting. Therefore, had pt transfer using STEDY. From here, therapist assisted pt to sink using STEDY and pt completed UB bathing & dressing AND peri cleansing in sitting to/from standing position with STEDY. Pt with no buckling of right knee during standing. During this session, focused skilled intervention on overall activity tolerance/endurance, sit to/from stands using STEDY, dynamic standing balance with STEDY, and ADL retraining. Pt with decreased activity tolerance/endurance and required multiple rest breaks. At end of  session, assisted pt to recliner using STEDY and left pt seated in recliner with all needs within reach.   Therapy Documentation Precautions:  Precautions Precautions: ICD/Pacemaker Precaution Comments: per Dr. [name] UE is WBAT as of 5/31 Restrictions Weight Bearing Restrictions: Yes LUE Weight Bearing: Weight bearing as tolerated  Vital Signs: Therapy Vitals Temp: 98.9 F (37.2 C) Temp Source: Oral Pulse Rate: 94 Resp: 18 BP: 115/66 mmHg Patient Position (if appropriate): Lying Oxygen Therapy SpO2: 98 % O2 Device: Not Delivered  See Function Navigator for Current Functional Status.  Therapy/Group: Individual Therapy  [name] , MS, OTR/L, CLT  02/28/2016, 9:26 AM

## 2016-02-28 NOTE — Progress Notes (Signed)
Physical Therapy Session Note  Patient Details  Name: Eric [name] MRN: [id number] Date of Birth: December 09, 1950  Today's Date: 02/28/2016 PT Individual Time: 1104-1202 PT Individual Time Calculation (min): 58 min   Short Term Goals: Week 1:  PT Short Term Goal 1 (Week 1): Patient will perform Bed mobility with min A From PT and slight use of bed features.  PT Short Term Goal 2 (Week 1): Patient will perform Squat pivot/stand pivot transfers with mod A from PT.  PT Short Term Goal 3 (Week 1): Patient will perform WC mobility with supervision A for 130ft with with BLE and R UE technique.   Skilled Therapeutic Interventions/Progress Updates:    Pt received in w/c & agreeable to PT, noting 5-6 R knee pain but RN present & administering pain medication. Pt self propelled w/c ~150 ft room>gym with BUE for cardiovascular endurance training. Gait training completed 95 ft + 95 ft with RW & min A with pt demonstrating step to gait pattern. Educated pt on reciprocal pattern & when pt incorporating this pt experienced R knee buckling x 1 occasion requiring mod A for recovery. Pt required encouragement to continue ambulating with reciprocal gait but able to do so with significantly decreased gait speed & decreased step length BLE. Pt completed furniture transfer from couch in rehab apartment with Min A for stand>sit but required multiple trials & Max A for sit>stand from low, soft surface. Educated pt on anterior weight shift & pushing up with BUE's. Pt demonstrates BLE hip flexor & extensor weakness when transferring sit>stand. Pt transferred on to mat table with supervision for sit>supine & completed supine bridging + supine bridging with adductor hold both 10x each with 3 second hold. Pt required min A to facilitate hip adduction during exercises. Stand pivot mat table>w/c with RW & Min A & pt transported back to room. At end of session pt left in w/c with all needs within reach, wife present to supervise, and  set up with lunch tray.   Therapy Documentation Precautions:  Precautions Precautions: ICD/Pacemaker Precaution Comments: per Dr. [name] UE is WBAT as of 5/31 Restrictions Weight Bearing Restrictions: Yes LUE Weight Bearing: Weight bearing as tolerated  Pain: Pain Assessment Pain Assessment: 0-10 Pain Score: 5  Pain Location: Knee Pain Orientation: Right Pain Intervention(s):  (RN administering pain medication)   See Function Navigator for Current Functional Status.   Therapy/Group: Individual Therapy  [name] 02/28/2016, 7:58 AM

## 2016-02-28 NOTE — Progress Notes (Signed)
Occupational Therapy Session Note  Patient Details  Name: Eric [name] MRN: [id number] Date of Birth: 1951-06-22  Today's Date: 02/28/2016 OT Individual Time: 1330-1500 OT Individual Time Calculation (min): 90 min    Skilled Therapeutic Interventions/Progress Updates:   Pt rolled the wheelchair down to the therapy gym with supervision.  He was able to transfer from wheelchair to [facility] with min assist using the RW.  Worked on sit to stand transitions for varies heights on adjustable mat.  Mod assist for sit to stand without UE support.  Also worked in standing having pt reach down and perform slight flexion in his knees while reaching for clothespins, and then placing them up and to his right to increase right hip and knee extension in weightbearing.  Worked on weightshifting in standing with stepping forward and backwards while therapist provided support in the [place].  Decreased ability to maintain weightbearing on the RLE when stepping with the left.  Better job of stepping forward on the right while supporting his weight on the LLE.  Finished session with ambulation back to the room.  He was able to walk with min assist 120 ft with the RW before needing rest break.  He finished walking the rest of the way approximately 80 feet and was positioned in his wheelchair per his request with call button and phone in reach.     Therapy Documentation Precautions:  Precautions Precautions: ICD/Pacemaker Precaution Comments: per Dr. [name] UE is WBAT as of 5/31 Restrictions Weight Bearing Restrictions: Yes LUE Weight Bearing: Weight bearing as tolerated  Pain: Pain Assessment Pain Assessment: No/denies pain ADL: See Function Navigator for Current Functional Status.   Therapy/Group: Individual Therapy  Coltrane Tugwell OTR/L 02/28/2016, 4:00 PM

## 2016-02-29 ENCOUNTER — Inpatient Hospital Stay (HOSPITAL_COMMUNITY): Payer: BC Managed Care – PPO | Admitting: Physical Therapy

## 2016-02-29 ENCOUNTER — Inpatient Hospital Stay (HOSPITAL_COMMUNITY): Payer: BC Managed Care – PPO | Admitting: Occupational Therapy

## 2016-02-29 LAB — GLUCOSE, CAPILLARY
GLUCOSE-CAPILLARY: 93 mg/dL (ref 65–99)
GLUCOSE-CAPILLARY: 122 mg/dL — AB (ref 65–99)
Glucose-Capillary: 100 mg/dL — ABNORMAL HIGH (ref 65–99)
Glucose-Capillary: 104 mg/dL — ABNORMAL HIGH (ref 65–99)

## 2016-02-29 NOTE — Plan of Care (Signed)
Problem: RH Bed Mobility Goal: LTG Patient will perform bed mobility with assist (PT) LTG: Patient will perform bed mobility with assistance, with/without cues (PT).  Upgraded 2/2 pt progress  Problem: RH Car Transfers Goal: LTG Patient will perform car transfers with assist (PT) LTG: Patient will perform car transfers with assistance (PT).  With LRAD; upgrade 2/2 pt progress  Problem: RH Furniture Transfers Goal: LTG Patient will perform furniture transfers w/assist (OT/PT LTG: Patient will perform furniture transfers with assistance (OT/PT).  With LRAD; upgrade 2/2 pt progress  Problem: RH Ambulation Goal: LTG Patient will ambulate in controlled environment (PT) LTG: Patient will ambulate in a controlled environment, # of feet with assistance (PT).  150 ft with LRAD; upgrade 2/2 pt progress  Problem: RH Wheelchair Mobility Goal: LTG Patient will propel w/c in home environment (PT) LTG: Patient will propel wheelchair in home environment, # of feet with assistance (PT).  Outcome: Adequate for Discharge D/c 2/2 pt progress Goal: LTG Patient will propel w/c in community environment (PT) LTG: Patient will propel wheelchair in community environment, # of feet with assist (PT)  Outcome: Adequate for Discharge D/c 2/2 pt progress

## 2016-02-29 NOTE — Progress Notes (Signed)
Occupational Therapy Session Note  Patient Details  Name: Eric [name] MRN: [id number] Date of Birth: 14-Jun-1951  Today's Date: 02/29/2016 OT Individual Time: [id number] OT Individual Time Calculation (min): 74 min    Short Term Goals: Week 1:  OT Short Term Goal 1 (Week 1): Pt will perform toilet transfer with mod A in order to decrease level of assist with functional transfers. OT Short Term Goal 2 (Week 1): Pt will perform shower transfer with mod A in order to decrease level of assist with functional transfers.  OT Short Term Goal 3 (Week 1): Pt will perform LB dressing with mod A in order to increase I with functional task.  OT Short Term Goal 4 (Week 1): Pt will perform toileting with mod A balance or less in order to complete clothing management safely.   Skilled Therapeutic Interventions/Progress Updates:  Upon entering the room, pt seated on EOB finishing breakfast with 4/10 c/o pain in R knee but pt was medicated prior to OT arrival. Pt performed sit >stand from bed with min A. Pt ambulating 10' with RW into bathroom for toileting with min A. Pt seated on low toilet and standing with mod lifting assistance. Pt performed hygiene and clothing management with steady assist. Pt declined shower and requested to wash at sink from wheelchair. Pt engaged in bathing UB while seated in wheelchair and standing to wash buttocks and peri area with steady assistance and use of RW. Pt donned clothing items in the same manner with min A for standing balance to manage pulling over B hips. Pt able to don B shoes with increased time and encouragement. Pt seated in wheelchair with call bell and all needed items within reach upon exiting the room.   Therapy Documentation Precautions:  Precautions Precautions: ICD/Pacemaker Precaution Comments: per Dr. [name] UE is WBAT as of 5/31 Restrictions Weight Bearing Restrictions: Yes LUE Weight Bearing: Weight bearing as tolerated Vital Signs: Therapy  Vitals Pulse Rate: 81 BP: 137/65 mmHg  See Function Navigator for Current Functional Status.   Therapy/Group: Individual Therapy  Phineas Semen 02/29/2016, 9:35 AM

## 2016-02-29 NOTE — Progress Notes (Signed)
[facility] PHYSICAL MEDICINE & REHABILITATION     PROGRESS NOTE  Subjective/Complaints:  Pt sitting at the edge of the bed this AM. He slept fairly overnight.  He does not have any complaints this AM.    ROS: Denies CP, SOB, nausea, vomiting, diarrhea.  Objective: Vital Signs: Blood pressure 137/65, pulse 81, temperature 98.8 F (37.1 C), temperature source Oral, resp. rate 18, height 5\' 9"  (1.753 m), weight 103.602 kg (228 lb 6.4 oz), SpO2 98 %. No results found.  Recent Labs  02/28/16 0651  WBC 6.6  HGB 9.2*  HCT 29.0*  PLT 192    Recent Labs  02/28/16 0651  NA 135  K 3.7  CL 107  GLUCOSE 106*  BUN 21*  CREATININE 1.54*  CALCIUM 8.9   CBG (last 3)   Recent Labs  02/28/16 1634 02/28/16 2135 02/29/16 0636  GLUCAP 133* 96 100*    Wt Readings from Last 3 Encounters:  02/29/16 103.602 kg (228 lb 6.4 oz)  02/22/16 107.094 kg (236 lb 1.6 oz)  12/31/15 109.825 kg (242 lb 1.9 oz)    Physical Exam:  BP 137/65 mmHg  Pulse 81  Temp(Src) 98.8 F (37.1 C) (Oral)  Resp 18  Ht 5\' 9"  (1.753 m)  Wt 103.602 kg (228 lb 6.4 oz)  BMI 33.71 kg/m2  SpO2 98% Constitutional: He appears well-developed and well-nourished. NAD. HENT: Normocephalic and atraumatic.  Eyes: EOM and conjunctiva are normal.   Cardiovascular: Normal rate. Exam reveals no friction rub. No murmur heard. Respiratory: No respiratory distress. He has no wheezes. He has no rales.  Musculoskeletal:  Pain along both lateral hips/low back with palpation and hip flexion, improved.  No pain with palpation of right knee. No edema.  Neurological:  Diminished sensation to light touch bilateral anterior and lateral thighs, right greater than left (Improving) Motor: Bilateral upper extremities 5/5 proximal to distal Right lower extremity: Hip flexion 2/5 (improving), knee extension 4/5, ankle dorsi/plantar flexion 4+/5 Left lower extremity: Hip flexion: 3/5, knee extension 4+/5, ankle dorsi/plantar flexion  4+/5 Skin: Pacemaker site C/D/I   Assessment/Plan: 1. Functional deficits secondary to cardiac arrest/CPR status post CRT-D which require 3+ hours per day of interdisciplinary therapy in a comprehensive inpatient rehab setting. Physiatrist is providing close team supervision and 24 hour management of active medical problems listed below. Physiatrist and rehab team continue to assess barriers to discharge/monitor patient progress toward functional and medical goals.  Function:  Bathing Bathing position   Position: Other (comment) (w/c for LB bathing, STEDY for UB bathing)  Bathing parts Body parts bathed by patient: Right arm, Left arm, Chest, Abdomen, Front perineal area, Right upper leg, Left upper leg, Buttocks, Right lower leg, Left lower leg, Back Body parts bathed by helper: Buttocks, Right lower leg, Left lower leg, Back  Bathing assist Assist Level: Supervision or verbal cues      Upper Body Dressing/Undressing Upper body dressing   What is the patient wearing?: Pull over shirt/dress     Pull over shirt/dress - Perfomed by patient: Thread/unthread right sleeve, Thread/unthread left sleeve, Put head through opening, Pull shirt over trunk Pull over shirt/dress - Perfomed by helper: Pull shirt over trunk        Upper body assist Assist Level: Set up   Set up : To obtain clothing/put away  Lower Body Dressing/Undressing Lower body dressing   What is the patient wearing?: Shoes, Ted Hose, Pants Underwear - Performed by patient: Pull underwear up/down, Thread/unthread left underwear leg, Thread/unthread  right underwear leg Underwear - Performed by helper: Pull underwear up/down, Thread/unthread left underwear leg, Thread/unthread right underwear leg Pants- Performed by patient: Thread/unthread right pants leg Pants- Performed by helper: Thread/unthread left pants leg, Pull pants up/down   Non-skid slipper socks- Performed by helper: Don/doff right sock, Don/doff left sock      Shoes - Performed by patient: Don/doff right shoe, Don/doff left shoe, Fasten right, Fasten left Shoes - Performed by helper: Don/doff right shoe, Don/doff left shoe, Fasten right, Fasten left       TED Hose - Performed by helper: Don/doff left TED hose, Don/doff right TED hose  Lower body assist Assist for lower body dressing: Touching or steadying assistance (Pt > 75%)   Set up : To obtain clothing/put away, Don/doff TED stockings  Toileting Toileting Toileting activity did not occur: No continent bowel/bladder event Toileting steps completed by patient: Adjust clothing prior to toileting, Performs perineal hygiene, Adjust clothing after toileting Toileting steps completed by helper: Adjust clothing after toileting Toileting Assistive Devices: Grab bar or rail  Toileting assist Assist level: Touching or steadying assistance (Pt.75%)   Transfers Chair/bed transfer   Chair/bed transfer method: Stand pivot Chair/bed transfer assist level: Touching or steadying assistance (Pt > 75%) Chair/bed transfer assistive device: Medical sales representative     Max distance: 95 ft Assist level: Touching or steadying assistance (Pt > 75%)   Wheelchair   Type: Manual Max wheelchair distance: 180 ft Assist Level: Touching or steadying assistance (Pt > 75%)  Cognition Comprehension Comprehension assist level: Follows complex conversation/direction with no assist  Expression Expression assist level: Expresses complex ideas: With no assist  Social Interaction Social Interaction assist level: Interacts appropriately 90% of the time - Needs monitoring or encouragement for participation or interaction.  Problem Solving Problem solving assist level: Solves complex problems: With extra time  Memory Memory assist level: Recognizes or recalls 90% of the time/requires cueing < 10% of the time    Medical Problem List and Plan: 1. LE weakness secondary to cardiac arrest/CPR status post CRT-D  02/17/2016 and retroperitoneal hematoma -Cont CIR 2. DVT Prophylaxis/Anticoagulation: Subcutaneous heparin. Monitor platelet counts and any signs of bleeding 3. Pain Management/chronic back pain: Oxycodone as needed  Voltaren gel for right knee - pain improved 4. Hypertension/ischemic cardiomyopathy. Amiodarone 400 mg daily, Coreg 25 mg daily, Lasix 40 mg daily,BIDIL 20-30 7.5 mg tablet 0.5 3 times a day.   Continues to be labile, pt asyptomatic  Monitor with increased mobility 5. Neuropsych: This patient is capable of making decisions on his own behalf. 6. Skin/Wound Care: Routine skin checks 7. Fluids/Electrolytes/Nutrition: Routine I&O   Hyponatremia: 135 on 6/5, will continue to monitor 8. Chronic renal insufficiency. Baseline creatinine 1.80-2.00.   Creatinine 1.54 on 6/5 9. Systolic congestive heart failure. Continue Lasix 40 mg daily. The patient daily 10. Hypothyroidism. Synthroid. TSH 2.861 11. Retroperitoneal hemorrhage with femoral nerve involvement likely affecting the iliacus muscle and leading to sensory loss along the antero-lateral thigh. Additionally, there is hematoma overlying the psoas muscles.  -cont conservative mgt for now  -Hb 9.2 on 6/5 12. DM type 2  HbA1c 7.1 on 5/18  Fairly controleld  Will cont to monitor and initiate medications if warranted 13. Obesity  Body mass index is 33.71 kg/(m^2).  Diet and exercise education  Will cont to encourage weight loss to increase endurance and promote overall health  LOS (Days) 7 A FACE TO FACE EVALUATION WAS PERFORMED  Dub Maclellan [name] 02/29/2016  8:08 AM

## 2016-02-29 NOTE — Progress Notes (Signed)
Physical Therapy Session Note  Patient Details  Name: Eric [name] MRN: [id number] Date of Birth: [birth year]  Today's Date: 02/29/2016 PT Individual Time: 1102-1200 and [id number] PT Individual Time Calculation (min): 58 min and 59 min  Short Term Goals: Week 1:  PT Short Term Goal 1 (Week 1): Patient will perform Bed mobility with min A From PT and slight use of bed features.  PT Short Term Goal 2 (Week 1): Patient will perform Squat pivot/stand pivot transfers with mod A from PT.  PT Short Term Goal 3 (Week 1): Patient will perform WC mobility with supervision A for 143ft with with BLE and R UE technique.   Skilled Therapeutic Interventions/Progress Updates:    Treatment 1: Pt received in w/c & agreeable to PT, denying c/o pain but noting stiffness in R knee. Discussed potential benefits of using ice vs heat to help alleviate R knee pain. Pt reports numbness in R LE in L3 dermatome pattern. Pt completed sit>stand transfers from w/c with supervision & gait training x 120 ft room>rehab apartment with pt experiencing 1 episode of R knee buckling but able to self recover with steady A from PT. During gait pt exhibited increasing gait speed & reciprocal pattern.  Pt demonstrates poor eccentric control with stand>sit 2/2 BLE weakness. Practiced sit>stand transfer from soft couch & low bed & pt able to complete both with Min A with cuing to scoot to edge of seat, increased anterior weight shift, powerful push to standing & BLE placement. Pt able to perform bed mobility (rolling L<>R & supine<>sit) with supervision with PT cuing to use LLE to assist RLE to edge of bed during supine>sit and bridging to allow pt to scoot to center of bed. Stair negotiation completed with PT educating pt on compensatory pattern for task (ascend leading with LLE, descend leading with RLE) & pt able to verbalize & demonstrate pattern with minimal cuing from PT. Negotiated 6 inch steps, 4 steps + 4 steps with Min A fading to  supervision & seated rest break in between 2/2 fatigue. Gait training x 150 ft gym>room with RW & supervision with pt not experiencing any knee buckling. At end of session pt left in care of NA.  Treatment 2: Pt received in bed & agreeable to tx, denying c/o pain. Pt reported need to use restroom; gait training x 10 ft in room with RW. Pt performed toileting transfers with supervision with PT cuing pt to push up on rails of 3-in-1; (+) void. Pt performed hand hygiene at sink with BUE support & distant supervision. Gait training x 175 ft with RW & supervision without any R knee buckling noted, reciprocal gait pattern, and increasing gait speed. In gym pt transferred sit>supine on mat table with Min A for RLE support. PT gave pt supine BLE HEP & pt completed the following exercises: ankle pumps/circles, quad & glute sets, heel slides, short arc quads, hip abduction/adduction, straight leg raises & bridging. Pt unable to perform RLE short arc quad without assistance as he only has 1/5 strength in quads. Also required AAROM for straight leg raises. Pt's BLE adductors are weak as well, as pt has BLE splaying out during heel slides & bridging exercises. Pt transferred supine>sit edge of mat with supervision. Gait training x 175 ft back to room with RW & Supervision A. At end of session pt left in w/c with all needs within reach & wife present in room.  Therapy Documentation Precautions:  Precautions Precautions: ICD/Pacemaker Precaution Comments: per  Dr. [name] UE is WBAT as of 5/31 Restrictions Weight Bearing Restrictions: Yes LUE Weight Bearing: Weight bearing as tolerated  Pain: Pain Assessment Pain Assessment: No/denies pain   See Function Navigator for Current Functional Status.   Therapy/Group: Individual Therapy  [name] 02/29/2016, 7:53 AM

## 2016-03-01 ENCOUNTER — Inpatient Hospital Stay (HOSPITAL_COMMUNITY): Payer: BC Managed Care – PPO | Admitting: Physical Therapy

## 2016-03-01 ENCOUNTER — Inpatient Hospital Stay (HOSPITAL_COMMUNITY): Payer: BC Managed Care – PPO | Admitting: Occupational Therapy

## 2016-03-01 LAB — GLUCOSE, CAPILLARY
GLUCOSE-CAPILLARY: 91 mg/dL (ref 65–99)
GLUCOSE-CAPILLARY: 125 mg/dL — AB (ref 65–99)
Glucose-Capillary: 102 mg/dL — ABNORMAL HIGH (ref 65–99)
Glucose-Capillary: 118 mg/dL — ABNORMAL HIGH (ref 65–99)

## 2016-03-01 NOTE — Progress Notes (Signed)
Occupational Therapy Session Note  Patient Details  Name: Eric [name] MRN: [id number] Date of Birth: 03/07/1951  Today's Date: 03/01/2016 OT Individual Time: 1305-1405 OT Individual Time Calculation (min): 60 min   Short Term Goals: Week 1:  OT Short Term Goal 1 (Week 1): Pt will perform toilet transfer with mod A in order to decrease level of assist with functional transfers. OT Short Term Goal 2 (Week 1): Pt will perform shower transfer with mod A in order to decrease level of assist with functional transfers.  OT Short Term Goal 3 (Week 1): Pt will perform LB dressing with mod A in order to increase I with functional task.  OT Short Term Goal 4 (Week 1): Pt will perform toileting with mod A balance or less in order to complete clothing management safely.   Skilled Therapeutic Interventions/Progress Updates:  Upon entering room, pt found seated in w/c willing and eager to work with therapist. Pt with no complaints of pain. Pt propelled self to sink and stood at sink for ~15 minutes to complete grooming task of shaving. Pt required min verbal and tactile cueing to not hyperextend knee in standing. Pt sat in w/c and propelled self to ADL apartment. Therapist educated pt on how to safely and effectively perform shower stall/walk-in shower transfer and used simulated block, shower seat, and RW for actual shower transfer practice. Therapist administered handout for technique. Pt then worked on self propulsion in w/c around unit for increasing overall endurance. Once back in the room, pt with request to use BR. Pt ambulated using RW into BR and stood to urinate. Therapist educated pt on correct technique for safety with RW during functional mobility. At end of session, left pt seated in w/c with all needs within reach.   Therapy Documentation Precautions:  Precautions Precautions: ICD/Pacemaker Precaution Comments: per Dr. [name] UE is WBAT as of 5/31 Restrictions Weight Bearing Restrictions:  Yes LUE Weight Bearing: Weight bearing as tolerated  See Function Navigator for Current Functional Status.  Therapy/Group: Individual Therapy  [name] 03/01/2016, 2:08 PM

## 2016-03-01 NOTE — Progress Notes (Signed)
Physical Therapy Session Note  Patient Details  Name: Eric [name] MRN: [id number] Date of Birth: 03/04/1951  Today's Date: 03/01/2016 PT Individual Time: 1430-1530 PT Individual Time Calculation (min): 60 min   Short Term Goals: Week 1:  PT Short Term Goal 1 (Week 1): Patient will perform Bed mobility with min A From PT and slight use of bed features.  PT Short Term Goal 1 - Progress (Week 1): Met PT Short Term Goal 2 (Week 1): Patient will perform Squat pivot/stand pivot transfers with mod A from PT.  PT Short Term Goal 2 - Progress (Week 1): Met PT Short Term Goal 3 (Week 1): Patient will perform WC mobility with supervision A for 150ft with with BLE and R UE technique.  PT Short Term Goal 3 - Progress (Week 1): Progressing toward goal Week 2:  PT Short Term Goal 1 (Week 2): STG = LTG due to anticipated d/c date.  Skilled Therapeutic Interventions/Progress Updates:    Patient received sitting in WC and agreeable to PT.  WC mobility performed in hall for 150ft x 3, 75ft x 2 with supervision A from PT for improved doorway navigation and WC parts management to prepare for transfers.   Gait training performed on carpet in [place] for 75ft, and 100ft with min progressing to supervision A. Min cues from PT for improved hip flexion with R LE swing through to prevent foot drag on carpet. Patient noted to continue to have mild R knee hyperextension in stance to prevent knee buckle.   Car transfer x 2 with RW and supervision A form PT. Mod-max cues for proper car transfer technique and proper UE placement to improve safety of transfers. PT required to only provide min cueing with second car transfer.   Standing therex with BUE support on RW. Marches x 10, BLE hip abduction x 10. PT provided min A for improved safety and for improved gluteal activation on the R LE to increase stability.  Throughout treatment patient performed sit<>stand x12 with RW with supervision A from PT with min cues for  UE and LE positioning   Patient requested to use restroom for urination at end of session. Gait in room with RW for 5ft x 2 and standing balance without UE support at toilet, supervision A from PT.  Left sitting in WC with call bell within reach.   Therapy Documentation Precautions:  Precautions Precautions: ICD/Pacemaker Precaution Comments: per Dr. Patel L UE is WBAT as of 5/31 Restrictions Weight Bearing Restrictions: Yes LUE Weight Bearing: Weight bearing as tolerated   Pain: Pain Assessment Pain Assessment: 0-10 Pain Score: 7  Pain Type: Acute pain Pain Location: Knee Pain Orientation: Right;Left Pain Frequency: Intermittent Pain Onset: With Activity Pain Intervention(s): Medication (See eMAR)   See Function Navigator for Current Functional Status.   Therapy/Group: Individual Therapy  Austin E Tucker 03/01/2016, 10:20 PM  

## 2016-03-01 NOTE — Progress Notes (Signed)
[facility] PHYSICAL MEDICINE & REHABILITATION     PROGRESS NOTE  Subjective/Complaints:   P still with bilateral LE weakness, memory has improved still does not remember day of cardiac arrest  ROS: Denies CP, SOB, nausea, vomiting, diarrhea.  Objective: Vital Signs: Blood pressure 139/70, pulse 73, temperature 98.2 F (36.8 C), temperature source Oral, resp. rate 18, height 5' 9" (1.753 m), weight 102.377 kg (225 lb 11.2 oz), SpO2 100 %. No results found.  Recent Labs  02/28/16 0651  WBC 6.6  HGB 9.2*  HCT 29.0*  PLT 192    Recent Labs  02/28/16 0651  NA 135  K 3.7  CL 107  GLUCOSE 106*  BUN 21*  CREATININE 1.54*  CALCIUM 8.9   CBG (last 3)   Recent Labs  02/29/16 1638 02/29/16 2042 03/01/16 0655  GLUCAP 93 122* 102*    Wt Readings from Last 3 Encounters:  03/01/16 102.377 kg (225 lb 11.2 oz)  02/22/16 107.094 kg (236 lb 1.6 oz)  12/31/15 109.825 kg (242 lb 1.9 oz)    Physical Exam:  BP 139/70 mmHg  Pulse 73  Temp(Src) 98.2 F (36.8 C) (Oral)  Resp 18  Ht 5' 9" (1.753 m)  Wt 102.377 kg (225 lb 11.2 oz)  BMI 33.31 kg/m2  SpO2 100% Constitutional: He appears well-developed and well-nourished. NAD. HENT: Normocephalic and atraumatic.  Eyes: EOM and conjunctiva are normal.   Cardiovascular: Normal rate. Exam reveals no friction rub. No murmur heard. Respiratory: No respiratory distress. He has no wheezes. He has no rales.  Musculoskeletal:  Pain along both lateral hips/low back with palpation and hip flexion, improved.  No pain with palpation of right knee. No edema.  Neurological:  Diminished sensation to light touch bilateral anterior and lateral thighs, right greater than left (Improving) Motor: Bilateral upper extremities 5/5 proximal to distal Right lower extremity: Hip flexion and Adduction 2/5 (improving), knee extension 4/5, ankle dorsi/plantar flexion 4+/5 Left lower extremity: Hip flexion: and adduction 3-/5, knee extension 4+/5,  ankle dorsi/plantar flexion 4+/5 Skin: Pacemaker site C/D/I   Assessment/Plan: 1. Functional deficits secondary to cardiac arrest/CPR status post CRT-D which require 3+ hours per day of interdisciplinary therapy in a comprehensive inpatient rehab setting. Physiatrist is providing close team supervision and 24 hour management of active medical problems listed below. Physiatrist and rehab team continue to assess barriers to discharge/monitor patient progress toward functional and medical goals.  Function:  Bathing Bathing position   Position: Wheelchair/chair at sink  Bathing parts Body parts bathed by patient: Right arm, Left arm, Chest, Abdomen, Front perineal area, Right upper leg, Left upper leg, Buttocks, Right lower leg, Left lower leg, Back Body parts bathed by helper: Buttocks, Right lower leg, Left lower leg, Back  Bathing assist Assist Level: Touching or steadying assistance(Pt > 75%)      Upper Body Dressing/Undressing Upper body dressing   What is the patient wearing?: Pull over shirt/dress     Pull over shirt/dress - Perfomed by patient: Thread/unthread right sleeve, Thread/unthread left sleeve, Put head through opening, Pull shirt over trunk Pull over shirt/dress - Perfomed by helper: Pull shirt over trunk        Upper body assist Assist Level: Set up   Set up : To obtain clothing/put away  Lower Body Dressing/Undressing Lower body dressing   What is the patient wearing?: Shoes, [employer], Pants Underwear - Performed by patient: Pull underwear up/down, Thread/unthread left underwear leg, Thread/unthread right underwear leg Underwear - Performed by helper:  Pull underwear up/down, Thread/unthread left underwear leg, Thread/unthread right underwear leg Pants- Performed by patient: Thread/unthread right pants leg, Thread/unthread left pants leg, Pull pants up/down Pants- Performed by helper: Thread/unthread left pants leg, Pull pants up/down   Non-skid slipper socks-  Performed by helper: Don/doff right sock, Don/doff left sock     Shoes - Performed by patient: Don/doff right shoe, Don/doff left shoe, Fasten right, Fasten left Shoes - Performed by helper: Don/doff right shoe, Don/doff left shoe, Fasten right, Fasten left       TED Hose - Performed by helper: Don/doff left TED hose, Don/doff right TED hose  Lower body assist Assist for lower body dressing: Touching or steadying assistance (Pt > 75%)   Set up : To obtain clothing/put away, Don/doff TED stockings  Toileting Toileting Toileting activity did not occur: No continent bowel/bladder event Toileting steps completed by patient: Adjust clothing prior to toileting, Performs perineal hygiene, Adjust clothing after toileting Toileting steps completed by helper: Adjust clothing after toileting Toileting Assistive Devices: Grab bar or rail  Toileting assist Assist level: Touching or steadying assistance (Pt.75%)   Transfers Chair/bed transfer   Chair/bed transfer method: Ambulatory Chair/bed transfer assist level: Supervision or verbal cues Chair/bed transfer assistive device: Medical sales representative     Max distance: 175 ft Assist level: Supervision or verbal cues   Wheelchair   Type: Manual Max wheelchair distance: 180 ft Assist Level: Touching or steadying assistance (Pt > 75%)  Cognition Comprehension Comprehension assist level: Follows complex conversation/direction with no assist  Expression Expression assist level: Expresses complex ideas: With no assist  Social Interaction Social Interaction assist level: Interacts appropriately 90% of the time - Needs monitoring or encouragement for participation or interaction.  Problem Solving Problem solving assist level: Solves complex problems: With extra time  Memory Memory assist level: Recognizes or recalls 90% of the time/requires cueing < 10% of the time    Medical Problem List and Plan: 1. LE weakness secondary to cardiac  arrest/CPR status post CRT-D 02/17/2016 and retroperitoneal hematoma -Team conference today please see physician documentation under team conference tab, met with team face-to-face to discuss problems,progress, and goals. Formulized individual treatment plan based on medical history, underlying problem and comorbidities. 2. DVT Prophylaxis/Anticoagulation: Subcutaneous heparin. Monitor platelet counts and any signs of bleeding 3. Pain Management/chronic back pain: Oxycodone as needed  Voltaren gel for right knee - pain improved 4. Hypertension/ischemic cardiomyopathy. Amiodarone 400 mg daily, Coreg 25 mg daily, Lasix 40 mg daily,BIDIL 20-30 7.5 mg tablet 0.5 3 times a day.   Continues to be labile, pt asyptomatic   Filed Vitals:   02/29/16 1718 03/01/16 0555  BP: 117/60 139/70  Pulse: 89 73  Temp:  98.2 F (36.8 C)  Resp:  18   5. Neuropsych: This patient is capable of making decisions on his own behalf. 6. Skin/Wound Care: Routine skin checks 7. Fluids/Electrolytes/Nutrition: Routine I&O   Hyponatremia: 135 on 6/5, will continue to monitor 8. Chronic renal insufficiency. Baseline creatinine 1.80-2.00.   Creatinine 1.54 on 6/5 9. Systolic congestive heart failure. Continue Lasix 40 mg daily. The patient daily 10. Hypothyroidism. Synthroid. TSH 2.861 11. 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 Additionally, there is hematoma overlying the psoas muscles.L2,3 nerve roots primarily affected  -cont conservative mgt for now  -Hb 9.2 on 6/5 12. DM type 2  HbA1c 7.1 on 5/18   CBG (last  3)   Recent Labs  02/29/16 1638 02/29/16 2042 03/01/16 0655  GLUCAP 93 122* 102*     Will cont to monitor and initiate medications if warranted 13. Obesity  Body mass index is 33.31 kg/(m^2).  Diet and exercise education  Will cont to encourage weight loss to  increase endurance and promote overall health  LOS (Days) 8 A FACE TO FACE EVALUATION WAS PERFORMED  KIRSTEINS,ANDREW E 03/01/2016 7:20 AM

## 2016-03-01 NOTE — Progress Notes (Signed)
Physical Therapy Weekly Progress Note  Patient Details  Name: Eric [name] MRN: [id number] Date of Birth: 19-Sep-1951  Beginning of progress report period: Feb 23, 2016 End of progress report period: March 01, 2016  Today's Date: 03/01/2016 PT Individual Time: 1115-1200 PT Individual Time Calculation (min): 45 min   Patient has met 2 of 3 short term goals.  Pt has made good progress towards LTG's due to increasing BLE strength & safety awareness. Pt has progressed towards ambulation with RW & Min A/supervision, demonstrating lessening R knee buckling. Pt continues to exhibit very weak R LE quads but improving ability to use compensatory techniques for functional tasks.   Patient continues to demonstrate the following deficits: BLE weakness, especially RLE quads, impaired standing balance, continued need for use of AD with all mobility, decreased endurance and therefore will continue to benefit from skilled PT intervention to enhance overall performance with activity tolerance, balance and awareness. Pt would also benefit from continued PT to progress stair negotiation with 1 rail instead of BLE railings.  Patient progressing toward long term goals. Long term goals updated on 02/29/16 to Mod I with ambulation.  Continue plan of care.  PT Short Term Goals Week 1:  PT Short Term Goal 1 (Week 1): Patient will perform Bed mobility with min A From PT and slight use of bed features.  PT Short Term Goal 1 - Progress (Week 1): Met PT Short Term Goal 2 (Week 1): Patient will perform Squat pivot/stand pivot transfers with mod A from PT.  PT Short Term Goal 2 - Progress (Week 1): Met PT Short Term Goal 3 (Week 1): Patient will perform WC mobility with supervision A for 179f with with BLE and R UE technique.  PT Short Term Goal 3 - Progress (Week 1): Progressing toward goal Week 2:  PT Short Term Goal 1 (Week 2): STG = LTG due to anticipated d/c date.  Skilled Therapeutic Interventions/Progress Updates:   Pt received in w/c & agreeable to PT, noting 1/10 stiffness/pain in R hip. Pt completed sit<>stand transfers with supervision A. Gait training x 180 ft + 180 ft with RW & supervision; no R knee buckling noted & PT educated pt to decrease weight [place] in BLE. Utilized nu-step Level 2 without BUE, focusing on strengthening BLE; pt noted 13 on BORG RPE scale. Performed step ups on single step for RLE strengthening (ascending leading with RLE, descending leading with LLE). Pt required max A to attempt single 6 inch step due to R knee buckling. Attempted task on 3 inch steps & pt able to perform ~10 repetitions with min A & BUE support. At end of session pt left in room in w/c with all needs within reach.   Therapy Documentation Precautions:  Precautions Precautions: ICD/Pacemaker Precaution Comments: per Dr. PRolanda LundborgUE is WBAT as of 5/31 Restrictions Weight Bearing Restrictions: Yes LUE Weight Bearing: Weight bearing as tolerated  Pain: Pain Assessment Pain Assessment: 0-10 Pain Score: 1  Pain Location: Hip Pain Orientation: Right Pain Intervention(s): Repositioned;Ambulation/increased activity   See Function Navigator for Current Functional Status.  Therapy/Group: Individual Therapy  VWaunita Schooner6/03/2016, 7:54 AM

## 2016-03-01 NOTE — Progress Notes (Signed)
Physical Therapy Session Note  Patient Details  Name: Eric [name] MRN: [id number] Date of Birth: [birth year]  Today's Date: 03/01/2016 PT Individual Time: 1530-1600 PT Individual Time Calculation (min): 30 min   Short Term Goals: Week 2:  PT Short Term Goal 1 (Week 2): STG = LTG due to anticipated d/c date.  Skilled Therapeutic Interventions/Progress Updates:    Pt presented in chair agreeable to therapy.  Pt performed 3" steps x8 with bilateral rails and step to pattern with close supervision.  Noted by PT decreased eccentric control at right knee when reaching full extension.  Pt participated in standing TKE with use of R resistance band to facilitate improved control with knee extension.  Tactile feedback provided to facilitate quad activation and to minimize R knee hyperextension.  Pt also participated in ambulation approx 47ft with orange resistance band and tactile cues by PT to promote feedback of controlled knee extension with ambulation.  Pt returned to room with amenities within reach.   Therapy Documentation Precautions:  Precautions Precautions: ICD/Pacemaker Precaution Comments: per Dr. [name] UE is WBAT as of 5/31 Restrictions Weight Bearing Restrictions: Yes LUE Weight Bearing: Weight bearing as tolerated General:   Vital Signs: Therapy Vitals Temp: 97.5 F (36.4 C) Temp Source: Oral Pulse Rate: 78 Resp: 16 BP: (!) 121/54 mmHg Patient Position (if appropriate): Sitting Oxygen Therapy SpO2: 100 % O2 Device: Not Delivered Pain:   Mobility:          See Function Navigator for Current Functional Status.   Therapy/Group: Individual Therapy  Osamu Olguin  Janille Draughon, PTA  03/01/2016, 4:32 PM

## 2016-03-02 ENCOUNTER — Inpatient Hospital Stay (HOSPITAL_COMMUNITY): Payer: BC Managed Care – PPO | Admitting: Physical Therapy

## 2016-03-02 ENCOUNTER — Inpatient Hospital Stay (HOSPITAL_COMMUNITY): Payer: BC Managed Care – PPO | Admitting: Occupational Therapy

## 2016-03-02 LAB — GLUCOSE, CAPILLARY
GLUCOSE-CAPILLARY: 92 mg/dL (ref 65–99)
GLUCOSE-CAPILLARY: 108 mg/dL — AB (ref 65–99)
GLUCOSE-CAPILLARY: 100 mg/dL — AB (ref 65–99)
Glucose-Capillary: 108 mg/dL — ABNORMAL HIGH (ref 65–99)

## 2016-03-02 NOTE — Progress Notes (Signed)
Social Work Patient ID: Eric [name], male   DOB: 1951-03-17, 65 y.o.   MRN: [id number] Met with pt and spoke with wife via telephone to inform team conference goals now supervision level and new discharge date 6/16. Wife is in the process of finding someone to stay with pt while she works. She states: " Thus is not an easy thing to do, even if you have money."  She wants someone she can trust to be with pt and in their home. Pt feels if she needs through the weekend then will need to get insurance to cover Discussed with him this may not be a given and that she needed to find someone for him at home while she works. Will work on discharge plans and wife aware of the need for her to come in and go through therapies with hm Prior to discharge.

## 2016-03-02 NOTE — Patient Care Conference (Signed)
Inpatient RehabilitationTeam Conference and Plan of Care Update Date: 03/02/2016   Time: 8:00 am    Patient Name: Eric [name]      Medical Record Number: [id number]  Date of Birth: October 17, 1950 Sex: Male         Room/Bed: 4M08C/4M08C-02 Payor Info: Payor: [place] / Plan: [employer] PPO / Product Type: *No Product type* /    Admitting Diagnosis: Cardiac arrest, femoral neuropathy  Admit Date/Time:  02/22/2016  4:44 PM Admission Comments: No comment available   Primary Diagnosis:  Femoral nerve injury Principal Problem: Femoral nerve injury  Patient Active Problem List   Diagnosis Date Noted  . Right knee pain   . Diabetes mellitus type 2 in obese ([place])   . Labile blood pressure   . Chronic renal insufficiency   . Acute blood loss anemia   . Diabetes mellitus type 2 in nonobese ([facility])   . Debility 02/22/2016  . 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 Weakness of both lower extremities   . Lower extremity weakness   . Pneumonia   . Acute lumbar back pain   . Non-traumatic rhabdomyolysis   . Retroperitoneal bleed   . Back pain   . Leg weakness, bilateral   . Paroxysmal atrial fibrillation ([place])   . Ventricular fibrillation ([place]) 02/09/2016  . Aortic aneurysm without rupture ([place]) 02/09/2016  . Aortic insufficiency 02/09/2016  . HCAP (healthcare-associated pneumonia)   . AKI (acute kidney injury) ([place])   . Cardiac arrest ([place]) 02/01/2016  . Acute on chronic systolic heart failure ([place])   . Acute encephalopathy   . CKD (chronic kidney disease)   . Thyroid activity decreased   . Acute hypoxemic respiratory failure ([place])   . Acute respiratory failure ([place])   . Encounter for central line placement   . Arrhythmia   . Altered mental status   . CKD (chronic kidney disease), stage IV ([place]) 08/06/2013    Class: Chronic  . Long term (current) use of anticoagulants 08/06/2013    Class: Chronic  . Special screening for malignant neoplasms,  colon 04/11/2013  . Colon cancer screening 03/04/2013  . Chronic anticoagulation 03/04/2013  . Atrial flutter ([place]) 03/18/2012    Class: Acute  . Chronic systolic heart failure ([place]) 02/13/2012    Class: Acute  . Hypertension, accelerated 02/13/2012  . Aortic valve regurgitation, acquired 02/13/2012  . Aortic root enlargement ([place]) 02/13/2012  . Hyperlipidemia 02/13/2012  . Left bundle branch block 02/13/2012  . Obesity (BMI 30-39.9) 02/13/2012    Expected Discharge Date: Expected Discharge Date: 03/10/16  Team Members Present: Physician leading conference: Dr. Alger Simons Social Worker Present: Ovidio Kin, LCSW Nurse Present: Dorien Chihuahua, RN PT Present: Barrie Folk, PT OT Present: Willeen Cass, OT SLP Present: Weldon Inches, SLP;Nicole Page, SLP PPS Coordinator present : Ileana Ladd, PT     Current Status/Progress Goal Weekly Team Focus  Medical   LLE weakness improving, has hyperextension R knee with ambulation  Avoid secondary injury and complicatons such as DVT  D/C planning   Bowel/Bladder   continent of bowel and bladder. LBM 03/01/16  remain continent of bowel and bladder with min assist   offer toileting frequently. monitor bowel  and bladder    Swallow/Nutrition/ Hydration             ADL's   min A with RW functional ambulation and transfers, bathing and dressing sit to stand with min A, continues to present with  right LE weakness  overall supervision (goals upgraded )  activity tolerance, dynamic standing balance   Mobility   ambulating ~120 ft with RW & min A, R knee buckling during gait, negotiates 4 steps with B rails & Min A, pt with continued BLE weakness & poor eccentric control with stand>sit transfers, requires BUE support for standing balance  mod I sitting balance, supervision standing balance, supervision for transfers & gat with LRAD, Min A for stair negotiation  BLE strengthening, stair negotiation, gait training, endurance     Communication             Safety/Cognition/ Behavioral Observations            Pain   occasional pain to R knee. percocet q 4 hours as needed and voltaren gel   <4   Assess pain prn and tx accordingly.    Skin   abrasion to upper left abdomen. foam dx. ICD incision to left chest. steri strips intact   no new skin breakdown and infection  assess skin q shift and prn       *See Care Plan and progress notes for long and short-term goals.  Barriers to Discharge: pian issues from gait deviations    Possible Resolutions to Barriers:  Cont rehab, pain management    Discharge Planning/Teaching Needs:  Home with wife who can flex her schedule and has friends who can assist him while she is working. She has been here to observe pt in therapies.      Team Discussion:  Doing well and have upgraded goals to supervision and will reach these sooner than 6/22. Have moved up discharge date to 6/16. R-knee has not buckled in a few days, getting stronger. Working on [employer] to Treatment Plan:  Upgraded goals and moved up discharge date 6/16   Continued Need for Acute Rehabilitation Level of Care: The patient requires daily medical management by a physician with specialized training in physical medicine and rehabilitation for the following conditions: Daily direction of a multidisciplinary physical rehabilitation program to ensure safe treatment while eliciting the highest outcome that is of practical value to the patient.: Yes Daily medical management of patient stability for increased activity during participation in an intensive rehabilitation regime.: Yes Daily analysis of laboratory values and/or radiology reports with any subsequent need for medication adjustment of medical intervention for : Cardiac problems;Diabetes problems  Eric [name], Eric [name] 03/03/2016, 8:45 AM

## 2016-03-02 NOTE — Progress Notes (Signed)
Physical Therapy Session Note  Patient Details  Name: Eric [name] MRN: [id number] Date of Birth: 07/24/1951  Today's Date: 03/02/2016 PT Individual Time: 0946-1100 PT Individual Time Calculation (min): 74 min   Short Term Goals: Week 1:  PT Short Term Goal 1 (Week 1): Patient will perform Bed mobility with min A From PT and slight use of bed features.  PT Short Term Goal 1 - Progress (Week 1): Met PT Short Term Goal 2 (Week 1): Patient will perform Squat pivot/stand pivot transfers with mod A from PT.  PT Short Term Goal 2 - Progress (Week 1): Met PT Short Term Goal 3 (Week 1): Patient will perform WC mobility with supervision A for 145f with with BLE and R UE technique.  PT Short Term Goal 3 - Progress (Week 1): Progressing toward goal Week 2:  PT Short Term Goal 1 (Week 2): STG = LTG due to anticipated d/c date.  Skilled Therapeutic Interventions/Progress Updates:    [facility] following adminstration of pain medciation from RN for knee pain as listed below.   WC mobility for 2044fwith supervision A from PT with min cues for doorway management and proper positioning in seat.   Bed mobility with supervision A for sit<>supine and R oll R and L. Min A at R LE for supine <>sit. Min cues for improved technique for increased pelvic rotation and control of RLE.   Supine therex  SAQ x 10  Hip abduction on level 2 tband x 10  Ankle PF with level 2 tabnd x 15  Isometric hip abduction  Standing therex.  Terminal knee extension x 12 on R LE.  Throughout therex, PT provided min verbal instruction for improved exercise technique including increased R Gluteal activation of pelvic stability and increased ROM and controll of eccentric movements to increase strengthening aspects of exercise. Patient noted to have improved hip control with supine compared to previous sessions.   Gait training for 15027f  And [id number] with supervision A from PT. All  sit<>stand transfer performed with supervision A from PT with min cues for UE placement. No knee buckling noticed with transfers or gait on this day.   Patient left [facility] within reach at end of session   Therapy Documentation Precautions:  Precautions Precautions: ICD/Pacemaker Precaution Comments: per Dr. PateRolanda Lundborgis WBAT as of 5/31 Restrictions Weight Bearing Restrictions: Yes LUE Weight Bearing: Weight bearing as tolerated General:   Vital Signs:   Pain: Pain Assessment Pain Assessment: 0-10 Pain Score: 4  Pain Type: Acute pain Pain Location: Knee Pain Orientation: Right Pain Descriptors / Indicators: Aching Pain Frequency: Intermittent Pain Onset: With Activity Patients Stated Pain Goal: 2 Pain Intervention(s): Medication (See eMAR)   See Function Navigator for Current Functional Status.   Therapy/Group: Individual Therapy  AustLorie Phenix/2017, 12:55 PM

## 2016-03-02 NOTE — Progress Notes (Signed)
Physical Therapy Session Note  Patient Details  Name: Eric [name] MRN: [id number] Date of Birth: August 12, 1951  Today's Date: 03/02/2016 PT Group Time:  - 1300-1330 30 min     Short Term Goals: Week 1:  PT Short Term Goal 1 (Week 1): Patient will perform Bed mobility with min A From PT and slight use of bed features.  PT Short Term Goal 1 - Progress (Week 1): Met PT Short Term Goal 2 (Week 1): Patient will perform Squat pivot/stand pivot transfers with mod A from PT.  PT Short Term Goal 2 - Progress (Week 1): Met PT Short Term Goal 3 (Week 1): Patient will perform WC mobility with supervision A for 196f with with BLE and R UE technique.  PT Short Term Goal 3 - Progress (Week 1): Progressing toward goal   Therapy Documentation Precautions:  Precautions Precautions: ICD/Pacemaker Precaution Comments: per Dr. PRolanda LundborgUE is WBAT as of 5/31 Restrictions Weight Bearing Restrictions: Yes LUE Weight Bearing: Weight bearing as tolerated General: PT Amount of Missed Time (min): 30 Minutes PT Missed Treatment Reason: Unavailable (Comment) (secondary to needing time for phone appointment ) Vital Signs: Therapy Vitals Temp: 98.1 F (36.7 C) Temp Source: Oral Pulse Rate: 83 Resp: 18 BP: (!) 99/50 mmHg Patient Position (if appropriate): Sitting Oxygen Therapy SpO2: 100 % O2 Device: Not Delivered   Sit to and from stand transfer with RW close supervision  Patient ambulated 175 feet with RW close supervision. Verbal cues for upright posture, step length, environmental awareness and obstacle negotiation. Patient demonstrates slowed pace and decreased cadence.   Patient up and down 12 steps with bilateral handrails min assist. Patient performed stairs with a step to pattern. Verbal cues for sequence and technique.   Standing there ex with RW: B heel raises 3x10 Mini squats 15x2  Patient declined further therapy secondary to having scheduled appointment. Rehab tech TOlivia Mackiereturned  patient to his room.   See Function Navigator for Current Functional Status.   Therapy/Group: Individual Therapy  WRetta Diones6/04/2016, 4:05 PM

## 2016-03-02 NOTE — Progress Notes (Signed)
PHYSICAL MEDICINE & REHABILITATION     PROGRESS NOTE  Subjective/Complaints:   P still with bilateral LE weakness, memory has improved still does not remember day of cardiac arrest  ROS: Denies CP, SOB, nausea, vomiting, diarrhea.  Objective: Vital Signs: Blood pressure 137/68, pulse 80, temperature 98.3 F (36.8 C), temperature source Oral, resp. rate 18, height 5\' 9"  (1.753 m), weight 101.315 kg (223 lb 5.7 oz), SpO2 100 %. No results found. No results for input(s): WBC, HGB, HCT, PLT in the last 72 hours. No results for input(s): NA, K, CL, GLUCOSE, BUN, CREATININE, CALCIUM in the last 72 hours.  Invalid input(s): CO CBG (last 3)   Recent Labs  03/01/16 1648 03/01/16 2055 03/02/16 0646  GLUCAP 91 125* 92    Wt Readings from Last 3 Encounters:  03/02/16 101.315 kg (223 lb 5.7 oz)  02/22/16 107.094 kg (236 lb 1.6 oz)  12/31/15 109.825 kg (242 lb 1.9 oz)    Physical Exam:  BP 137/68 mmHg  Pulse 80  Temp(Src) 98.3 F (36.8 C) (Oral)  Resp 18  Ht 5\' 9"  (1.753 m)  Wt 101.315 kg (223 lb 5.7 oz)  BMI 32.97 kg/m2  SpO2 100% Constitutional: He appears well-developed and well-nourished. NAD. HENT: Normocephalic and atraumatic.  Eyes: EOM and conjunctiva are normal.   Cardiovascular: Normal rate. Exam reveals no friction rub. No murmur heard. Respiratory: No respiratory distress. He has no wheezes. He has no rales.  Musculoskeletal:  Pain along both lateral hips/low back with palpation and hip flexion, improved.  No pain with palpation of right knee. No edema.  Neurological:  Diminished sensation to light touch bilateral anterior and lateral thighs, right greater than left (Improving) Motor: Bilateral upper extremities 5/5 proximal to distal Right lower extremity: Hip flexion and Adduction 2/5 (improving), knee extension 4/5, ankle dorsi/plantar flexion 4+/5 Left lower extremity: Hip flexion: and adduction 3-/5, knee extension 4+/5, ankle dorsi/plantar  flexion 4+/5 Skin: Pacemaker site C/D/I   Assessment/Plan: 1. Functional deficits secondary to cardiac arrest/CPR status post CRT-D which require 3+ hours per day of interdisciplinary therapy in a comprehensive inpatient rehab setting. Physiatrist is providing close team supervision and 24 hour management of active medical problems listed below. Physiatrist and rehab team continue to assess barriers to discharge/monitor patient progress toward functional and medical goals.  Function:  Bathing Bathing position   Position: Wheelchair/chair at sink  Bathing parts Body parts bathed by patient: Right arm, Left arm, Chest, Abdomen, Front perineal area, Right upper leg, Left upper leg, Buttocks, Right lower leg, Left lower leg, Back Body parts bathed by helper: Buttocks, Right lower leg, Left lower leg, Back  Bathing assist Assist Level: Touching or steadying assistance(Pt > 75%)      Upper Body Dressing/Undressing Upper body dressing   What is the patient wearing?: Pull over shirt/dress     Pull over shirt/dress - Perfomed by patient: Thread/unthread right sleeve, Thread/unthread left sleeve, Put head through opening, Pull shirt over trunk Pull over shirt/dress - Perfomed by helper: Pull shirt over trunk        Upper body assist Assist Level: Set up   Set up : To obtain clothing/put away  Lower Body Dressing/Undressing Lower body dressing   What is the patient wearing?: Shoes, [employer], Pants Underwear - Performed by patient: Pull underwear up/down, Thread/unthread left underwear leg, Thread/unthread right underwear leg Underwear - Performed by helper: Pull underwear up/down, Thread/unthread left underwear leg, Thread/unthread right underwear leg Pants- Performed by patient: Thread/unthread  right pants leg, Thread/unthread left pants leg, Pull pants up/down Pants- Performed by helper: Thread/unthread left pants leg, Pull pants up/down   Non-skid slipper socks- Performed by helper:  Don/doff right sock, Don/doff left sock     Shoes - Performed by patient: Don/doff right shoe, Don/doff left shoe, Fasten right, Fasten left Shoes - Performed by helper: Don/doff right shoe, Don/doff left shoe, Fasten right, Fasten left       TED Hose - Performed by helper: Don/doff left TED hose, Don/doff right TED hose  Lower body assist Assist for lower body dressing: Touching or steadying assistance (Pt > 75%)   Set up : To obtain clothing/put away, Don/doff TED stockings  Toileting Toileting Toileting activity did not occur: No continent bowel/bladder event Toileting steps completed by patient: Adjust clothing prior to toileting, Performs perineal hygiene, Adjust clothing after toileting Toileting steps completed by helper: Adjust clothing after toileting Toileting Assistive Devices: Grab bar or rail  Toileting assist Assist level: Touching or steadying assistance (Pt.75%)   Transfers Chair/bed transfer   Chair/bed transfer method: Stand pivot Chair/bed transfer assist level: Supervision or verbal cues Chair/bed transfer assistive device: Environmental consultant, Air cabin crew     Max distance: 151ft Assist level: Supervision or verbal cues   Wheelchair   Type: Manual Max wheelchair distance: 182ft Assist Level: Supervision or verbal cues  Cognition Comprehension Comprehension assist level: Follows complex conversation/direction with no assist  Expression Expression assist level: Expresses complex ideas: With no assist  Social Interaction Social Interaction assist level: Interacts appropriately 90% of the time - Needs monitoring or encouragement for participation or interaction.  Problem Solving Problem solving assist level: Solves complex problems: With extra time  Memory Memory assist level: Recognizes or recalls 90% of the time/requires cueing < 10% of the time    Medical Problem List and Plan: 1. LE weakness secondary to cardiac arrest/CPR status post CRT-D  02/17/2016 and retroperitoneal hematoma -Right knee hyperextension related to R quad weakness. 2. DVT Prophylaxis/Anticoagulation: Subcutaneous heparin. Monitor platelet counts and any signs of bleeding 3. Pain Management/chronic back pain: Oxycodone as needed  Voltaren gel for right knee - pain improved 4. Hypertension/ischemic cardiomyopathy. Amiodarone 400 mg daily, Coreg 25 mg daily, Lasix 40 mg daily,BIDIL 20-30 7.5 mg tablet 0.5 3 times a day.   Continues to be labile, pt asyptomatic   Filed Vitals:   03/02/16 0558 03/02/16 0744  BP: 134/63 137/68  Pulse: 72 80  Temp: 98.3 F (36.8 C)   Resp: 18    5. Neuropsych: This patient is capable of making decisions on his own behalf. 6. Skin/Wound Care: Routine skin checks 7. Fluids/Electrolytes/Nutrition: Routine I&O   Hyponatremia: 135 on 6/5, will continue to monitor 8. Chronic renal insufficiency. Baseline creatinine 1.80-2.00.   Creatinine 1.54 on 6/5 9. Systolic congestive heart failure. Continue Lasix 40 mg daily. The patient daily 10. Hypothyroidism. Synthroid. TSH 2.861 11. 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 Additionally, there is hematoma overlying the psoas muscles.RIghtL2,3 nerve roots primarily affected  -cont conservative mgt for now  -Hb 9.2 on 6/5 12. DM type 2  HbA1c 7.1 on 5/18   CBG (last 3)   Recent Labs  03/01/16 1648 03/01/16 2055 03/02/16 0646  GLUCAP 91 125* 92     Will cont to monitor and initiate medications if warranted 13. Obesity  Body mass index is 32.97 kg/(m^2).  Diet and exercise  education  Will cont to encourage weight loss to increase endurance and promote overall health  LOS (Days) 9 A FACE TO FACE EVALUATION WAS PERFORMED  KIRSTEINS,ANDREW E 03/02/2016 7:59 AM

## 2016-03-02 NOTE — Progress Notes (Signed)
Occupational Therapy Weekly Progress Note  Patient Details  Name: Eric [name] MRN: [id number] Date of Birth: 1951-06-13  Beginning of progress report period: Feb 23, 2016 End of progress report period: March 02, 2016  Patient has met 5 of 5 short term goals.  Pt has mad excellent progress since time of eval.  Pt has progressed from max/ total A +2 to min A for functional mobility.  Pt cam now ambulate with min A with a RW around room. Pt can bathe at shower level and dress EOB with min A for sit to stand. Pt still requires varying A at times from min to mod A for sit to stand depending on the height of the surface. Pt's anticipated d/c date has been moved up due to his progress thus far. Pt's goals have also been upgraded to supervision for dynamic standing balance and LB dressing.   Patient continues to demonstrate the following deficits: decr activity tolerance, strength and coordination in LE right>LE, decr standing balance impacting his basic ADLs and therefore will continue to benefit from skilled OT intervention to enhance overall performance with BADL, iADL and Reduce care partner burden.  Patient progressing toward long term goals..  Continue plan of care.  OT Short Term Goals Week 1:  OT Short Term Goal 1 (Week 1): Pt will perform toilet transfer with mod A in order to decrease level of assist with functional transfers. OT Short Term Goal 1 - Progress (Week 1): Met OT Short Term Goal 2 (Week 1): Pt will perform shower transfer with mod A in order to decrease level of assist with functional transfers.  OT Short Term Goal 2 - Progress (Week 1): Met OT Short Term Goal 3 (Week 1): Pt will perform LB dressing with mod A in order to increase I with functional task.  OT Short Term Goal 3 - Progress (Week 1): Met OT Short Term Goal 4 (Week 1): Pt will perform toileting with mod A balance or less in order to complete clothing management safely.  OT Short Term Goal 4 - Progress (Week 1):  Met Week 2:  OT Short Term Goal 1 (Week 2): LTG=STG  Skilled Therapeutic Interventions/Progress Updates:    continue with POC  Therapy Documentation Precautions:  Precautions Precautions: ICD/Pacemaker Precaution Comments: per Dr. [name] UE is WBAT as of 5/31 Restrictions Weight Bearing Restrictions: Yes LUE Weight Bearing: Weight bearing as tolerated Vital Signs: Therapy Vitals Pulse Rate: 87 BP: (!) 110/57 mmHg  See Function Navigator for Current Functional Status.   Therapy/Group: Individual Therapy  Willeen Cass [facility] 03/02/2016, 7:55 PM

## 2016-03-02 NOTE — Progress Notes (Signed)
Occupational Therapy Session Note  Patient Details  Name: Eric [name] MRN: [id number] Date of Birth: 07-Sep-1951  Today's Date: 03/02/2016 OT Individual Time: [id number] Individual Time Calculation (min): 60 min    Short Term Goals: Week 1:  OT Short Term Goal 1 (Week 1): Pt will perform toilet transfer with mod A in order to decrease level of assist with functional transfers. OT Short Term Goal 2 (Week 1): Pt will perform shower transfer with mod A in order to decrease level of assist with functional transfers.  OT Short Term Goal 3 (Week 1): Pt will perform LB dressing with mod A in order to increase I with functional task.  OT Short Term Goal 4 (Week 1): Pt will perform toileting with mod A balance or less in order to complete clothing management safely.   Skilled Therapeutic Interventions/Progress Updates:    1:1 self care retraining at shower level with focus on sit to stand from different surfaces varying from min to mod A. Functional ambulation around room with RW with min A with extra time and VC for RW safety. Showered sit to stand with min A with A for lower legs and steadying A.  VC to dress right LE first to assist with success with dressing. Pt with difficulty with sit to stand from low bed after a couple of times due to fatigue. Pt with difficulty keeping LEs adducting during trials of sit to stand. Pt with weak adductors requiring VC and tactile support to maintain midline. Performed grooming in standing with steadying A.   Therapy Documentation Precautions:  Precautions Precautions: ICD/Pacemaker Precaution Comments: per Dr. [name] UE is WBAT as of 5/31 Restrictions Weight Bearing Restrictions: Yes LUE Weight Bearing: Weight bearing as tolerated Pain: Pain Assessment Pain Assessment: 0-10 Pain Score: 4  Pain Type: Acute pain Pain Location: Knee Pain Orientation: Right Pain Descriptors / Indicators: Aching Pain Frequency: Intermittent Pain Onset: With  Activity Patients Stated Pain Goal: 2 Pain Intervention(s): Medication (See eMAR)  See Function Navigator for Current Functional Status.   Therapy/Group: Individual Therapy  Willeen Cass [facility] 03/02/2016, 12:42 PM

## 2016-03-02 NOTE — Plan of Care (Signed)
Problem: RH Balance Goal: LTG Patient will maintain dynamic standing with ADLs (OT) LTG: Patient will maintain dynamic standing balance with assist during activities of daily living (OT)  Upgraded due to progress  Problem: RH Dressing Goal: LTG Patient will perform lower body dressing w/assist (OT) LTG: Patient will perform lower body dressing with assist, with/without cues in positioning using equipment (OT)  Upgraded due to progress

## 2016-03-03 ENCOUNTER — Inpatient Hospital Stay (HOSPITAL_COMMUNITY): Payer: BC Managed Care – PPO | Admitting: Occupational Therapy

## 2016-03-03 ENCOUNTER — Inpatient Hospital Stay (HOSPITAL_COMMUNITY): Payer: BC Managed Care – PPO | Admitting: Physical Therapy

## 2016-03-03 LAB — GLUCOSE, CAPILLARY
GLUCOSE-CAPILLARY: 101 mg/dL — AB (ref 65–99)
Glucose-Capillary: 95 mg/dL (ref 65–99)
Glucose-Capillary: 97 mg/dL (ref 65–99)
Glucose-Capillary: 108 mg/dL — ABNORMAL HIGH (ref 65–99)

## 2016-03-03 NOTE — Progress Notes (Signed)
Occupational Therapy Note  Patient Details  Name: Eric [name] MRN: [id number] Date of Birth: January 19, 1951  Today's Date: 03/03/2016 OT Individual Time: 1300-1330 OT Individual Time Calculation (min): 30 min   1:1 No c/o pain in session just fatigue and stiffness. Therapeutic activity with focus on sit to stand with attention to hip adduction left>right with and without UE support, standing tolerance and balance. Right LE strengthening and coordination with left Le propped up on step during some sit to stands. Pt with request to stretch back- reclined back on wedge with towel roll down spine for prolonged stretch, while continue to address strength and coordination with bilateral LE in prep for functional mobility and coordination for LB dressing and bathing. Pt with continued significant weakness at hips and quads in isolated movements - translating into heavy reliance on UB with sit to stands. Pt able to ambulate from gym back to room and into bathroom at slow pace but able to open and close doorways with supervision while navigating RW safely.    Willeen Cass [facility] 03/03/2016, 3:38 PM

## 2016-03-03 NOTE — Progress Notes (Signed)
Occupational Therapy Session Note  Patient Details  Name: Eric [name] MRN: [id number] Date of Birth: 06-20-1951  Today's Date: 03/03/2016 OT Individual Time: 8979-1504 OT Individual Time Calculation (Eric): 30 Eric   Short Term Goals: Week 1:  OT Short Term Goal 1 (Week 1): Pt will perform toilet transfer with mod A in order to decrease level of assist with functional transfers. OT Short Term Goal 1 - Progress (Week 1): Met OT Short Term Goal 2 (Week 1): Pt will perform shower transfer with mod A in order to decrease level of assist with functional transfers.  OT Short Term Goal 2 - Progress (Week 1): Met OT Short Term Goal 3 (Week 1): Pt will perform LB dressing with mod A in order to increase I with functional task.  OT Short Term Goal 3 - Progress (Week 1): Met OT Short Term Goal 4 (Week 1): Pt will perform toileting with mod A balance or less in order to complete clothing management safely.  OT Short Term Goal 4 - Progress (Week 1): Met   Week 2:  OT Short Term Goal 1 (Week 2): LTG=STG  Skilled Therapeutic Interventions/Progress Updates:  Patient found in Gay with NT present. Pt with minimal pain, no rate given, monitored during session. Pt ambulated out of BR to sink side for shaving task in standing. Pt stood about 10 minutes to complete shaving task, no LOB and no buckling of right knee. Pt sat in w/c from here. Therapist engaged pt in right and left hip external/internal stretching. Therapist then had pt work on marching in place X10 each leg, activating quad muscle (AA/ROM using towel to assist). Pt then engaged in quad kicks, X10 each leg, AROM/strengthening (no towel used on LLE, towel used for RLE). At end of session, left pt seated in w/c with all needs within reach.   Therapy Documentation Precautions:  Precautions Precautions: ICD/Pacemaker Precaution Comments: per Dr. [name] UE is WBAT as of 5/31 Restrictions Weight Bearing Restrictions: Yes LUE Weight Bearing: Weight  bearing as tolerated  Vital Signs: Therapy Vitals Temp: 98.9 F (37.2 C) Temp Source: Oral Pulse Rate: 76 Resp: 20 BP: (!) 129/54 mmHg Patient Position (if appropriate): Lying Oxygen Therapy SpO2: 99 % O2 Device: Not Delivered  See Function Navigator for Current Functional Status.  Therapy/Group: Individual Therapy  [name] , MS, OTR/L, CLT  03/03/2016, 11:35 AM

## 2016-03-03 NOTE — Progress Notes (Signed)
Physical Therapy Session Note  Patient Details  Name: Eric [name] MRN: [id number] Date of Birth: [birth year]  Today's Date: 03/03/2016 PT Individual Time: 0804-0904 PT Individual Time Calculation (min): 60 min   Short Term Goals: Week 2:  PT Short Term Goal 1 (Week 2): STG = LTG due to anticipated d/c date.  Skilled Therapeutic Interventions/Progress Updates:    Patient received sitting in [facility] with trade off from RN for medication administration.   PT provided min A for threading pants around R LE and supervision A with sit<>stand to pull pants to waist.   Gait training in hall for 146ft x2 with supervision A from PT. Patient continues to demonstrate improved knee stability with gait and slight knee hyerpextension 50% of the time on the R LE.   Nustep level 2 for 4 minutes and 6 minutes at level 5 for improved LE mobility and endurance min cues from PT for improved steps per min to keep >35.  Dynamic gait training with RW around 5 cones and stepping over 2 canes x 4.  PT provided close supervision A with mod A x 1 to prevent LOB with stepping over cane due to R LE knee buckling. Min-mod cues from PT for improved AD management and proper step to gait pattern with stepping over canes.    Curb training with min A progressing to close supervision A with multimodal cueing for proper technique and AD management.    Therapy Documentation Precautions:  Precautions Precautions: ICD/Pacemaker Precaution Comments: per Dr. [name] UE is WBAT as of 5/31 Restrictions Weight Bearing Restrictions: Yes LUE Weight Bearing: Weight bearing as tolerated Pain: Pain Assessment Pain Assessment: 0-10 Pain Score: 6  Pain Type: Chronic pain Pain Location: Knee Pain Orientation: Right Pain Descriptors / Indicators: Aching Pain Frequency: Intermittent Pain Onset: With Activity Patients Stated Pain Goal: 2 Pain Intervention(s): Medication (See eMAR) Multiple Pain Sites: No   See Function Navigator  for Current Functional Status.   Therapy/Group: Individual Therapy  [name] 03/03/2016, 9:58 AM

## 2016-03-03 NOTE — Progress Notes (Signed)
Occupational Therapy Session Note  Patient Details  Name: Eric [name] MRN: [id number] Date of Birth: 1951-01-13  Today's Date: 03/03/2016 OT Individual Time: 1345-1515   OT Individual Time Calculation   (90 min)  Short Term Goals: Week 1:  OT Short Term Goal 1 (Week 1): Pt will perform toilet transfer with mod A in order to decrease level of assist with functional transfers. OT Short Term Goal 1 - Progress (Week 1): Met OT Short Term Goal 2 (Week 1): Pt will perform shower transfer with mod A in order to decrease level of assist with functional transfers.  OT Short Term Goal 2 - Progress (Week 1): Met OT Short Term Goal 3 (Week 1): Pt will perform LB dressing with mod A in order to increase I with functional task.  OT Short Term Goal 3 - Progress (Week 1): Met OT Short Term Goal 4 (Week 1): Pt will perform toileting with mod A balance or less in order to complete clothing management safely.  OT Short Term Goal 4 - Progress (Week 1): Met Week 2:  OT Short Term Goal 1 (Week 2): LTG=STG  Skilled Therapeutic Interventions/Progress Updates:    Pt sitting in recliner upon OT arrival.  Skilled intervention with focus on endurance, strength. Pt reported he was not to be lifting past 90 with left sho flexion.  Pt. Ambulated with RW to toilet and transferred with CGA.  Taken to [employer].  Pt engaged in cooking grits at RW level for 12 minutes with no LOB and SBA for reaching into low cabinets.  Pt able to reach for pots on second shelf from bottom but  not lowest shelf.  Pt ambulated to have sit rest break after 12 minute mark.  Rest break and then proceeded to serve and clean up.  OT provided hot back for back pain with good results per pt report.  Placed heat back on right knee for 10 minutes.  Pt. Stated sitting was harder on his back than standing.  Ambulated back to room and left with all needs in reach.   Therapy Documentation Precautions:  Precautions Precautions: ICD/Pacemaker Precaution  Comments: per Dr. [name] UE is WBAT as of 5/31 Restrictions Weight Bearing Restrictions: Yes LUE Weight Bearing: Weight bearing as tolerated    Vital Signs: Therapy Vitals Temp: 98.4 F (36.9 C) Temp Source: Oral Pulse Rate: 93 Resp: 18 BP: (!) 109/53 mmHg Patient Position (if appropriate): Sitting Oxygen Therapy SpO2: 100 % O2 Device: Not Delivered Pain:low, middle back 3/10; right knee 3/10    See Function Navigator for Current Functional Status.   Therapy/Group: Individual Therapy  Lisa Roca 03/03/2016, 2:02 PM

## 2016-03-03 NOTE — Progress Notes (Signed)
[facility]     PROGRESS NOTE  Subjective/Complaints:  Some knee pain on right with ambulation, occ twitching R Lateral thigh  ROS: Denies CP, SOB, nausea, vomiting, diarrhea.  Objective: Vital Signs: Blood pressure 129/54, pulse 76, temperature 98.9 F (37.2 C), temperature source Oral, resp. rate 20, height 5\' 9"  (1.753 m), weight 102.8 kg (226 lb 10.1 oz), SpO2 99 %. No results found. No results for input(s): WBC, HGB, HCT, PLT in the last 72 hours. No results for input(s): NA, K, CL, GLUCOSE, BUN, CREATININE, CALCIUM in the last 72 hours.  Invalid input(s): CO CBG (last 3)   Recent Labs  03/02/16 1630 03/02/16 2024 03/03/16 0659  GLUCAP 108* 100* 95    Wt Readings from Last 3 Encounters:  03/03/16 102.8 kg (226 lb 10.1 oz)  02/22/16 107.094 kg (236 lb 1.6 oz)  12/31/15 109.825 kg (242 lb 1.9 oz)    Physical Exam:  BP 129/54 mmHg  Pulse 76  Temp(Src) 98.9 F (37.2 C) (Oral)  Resp 20  Ht 5\' 9"  (1.753 m)  Wt 102.8 kg (226 lb 10.1 oz)  BMI 33.45 kg/m2  SpO2 99% Constitutional: He appears well-developed and well-nourished. NAD. HENT: Normocephalic and atraumatic.  Eyes: EOM and conjunctiva are normal.   Cardiovascular: Normal rate. Exam reveals no friction rub. No murmur heard. Respiratory: No respiratory distress. He has no wheezes. He has no rales.  Musculoskeletal:  Pain along both lateral hips/low back with palpation and hip flexion, improved.  No pain with palpation of right knee. No edema.  Neurological:  Diminished sensation to light touch bilateral anterior and lateral thighs, right greater than left (Improving) Motor: Bilateral upper extremities 5/5 proximal to distal Right lower extremity: Hip flexion and Adduction 2/5 (improving), knee extension 4/5, ankle dorsi/plantar flexion 4+/5 Left lower extremity: Hip flexion: and adduction 3-/5, knee extension 4+/5, ankle dorsi/plantar flexion 4+/5 Skin: Pacemaker site  C/D/I   Assessment/Plan: 1. Functional deficits secondary to cardiac arrest/CPR status post CRT-D which require 3+ hours per day of interdisciplinary therapy in a comprehensive inpatient rehab setting. Physiatrist is providing close team supervision and 24 hour management of active medical problems listed below. Physiatrist and rehab team continue to assess barriers to discharge/monitor patient progress toward functional and medical goals.  Function:  Bathing Bathing position   Position: Shower  Bathing parts Body parts bathed by patient: Right arm, Left arm, Chest, Abdomen, Buttocks, Front perineal area, Left upper leg, Right upper leg Body parts bathed by helper: Right lower leg, Left lower leg, Back  Bathing assist Assist Level: Touching or steadying assistance(Pt > 75%)      Upper Body Dressing/Undressing Upper body dressing   What is the patient wearing?: Pull over shirt/dress     Pull over shirt/dress - Perfomed by patient: Thread/unthread right sleeve, Thread/unthread left sleeve, Put head through opening, Pull shirt over trunk Pull over shirt/dress - Perfomed by helper: Pull shirt over trunk        Upper body assist Assist Level: Set up   Set up : To obtain clothing/put away  Lower Body Dressing/Undressing Lower body dressing   What is the patient wearing?: Shoes, [employer], Pants Underwear - Performed by patient: Pull underwear up/down, Thread/unthread left underwear leg, Thread/unthread right underwear leg Underwear - Performed by helper: Pull underwear up/down, Thread/unthread left underwear leg, Thread/unthread right underwear leg Pants- Performed by patient: Thread/unthread right pants leg, Thread/unthread left pants leg, Pull pants up/down Pants- Performed by helper: Thread/unthread left  pants leg, Pull pants up/down   Non-skid slipper socks- Performed by helper: Don/doff right sock, Don/doff left sock     Shoes - Performed by patient: Don/doff right shoe,  Don/doff left shoe, Fasten right, Fasten left Shoes - Performed by helper: Don/doff right shoe, Don/doff left shoe, Fasten right, Fasten left       TED Hose - Performed by helper: Don/doff left TED hose, Don/doff right TED hose  Lower body assist Assist for lower body dressing: Touching or steadying assistance (Pt > 75%)   Set up : To obtain clothing/put away, Don/doff TED stockings  Toileting Toileting Toileting activity did not occur: No continent bowel/bladder event Toileting steps completed by patient: Adjust clothing prior to toileting, Performs perineal hygiene, Adjust clothing after toileting Toileting steps completed by helper: Adjust clothing after toileting Toileting Assistive Devices: Grab bar or rail  Toileting assist Assist level: Touching or steadying assistance (Pt.75%)   Transfers Chair/bed transfer   Chair/bed transfer method: Ambulatory Chair/bed transfer assist level: Supervision or verbal cues Chair/bed transfer assistive device: Environmental consultant, Air cabin crew     Max distance: 245ft Assist level: Supervision or verbal cues   Wheelchair   Type: Manual Max wheelchair distance: 155ft Assist Level: Supervision or verbal cues  Cognition Comprehension Comprehension assist level: Follows complex conversation/direction with no assist  Expression Expression assist level: Expresses complex ideas: With no assist  Social Interaction Social Interaction assist level: Interacts appropriately 90% of the time - Needs monitoring or encouragement for participation or interaction.  Problem Solving Problem solving assist level: Solves complex problems: With extra time  Memory Memory assist level: Recognizes or recalls 90% of the time/requires cueing < 10% of the time    Medical Problem List and Plan: 1. 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 2. DVT Prophylaxis/Anticoagulation: Subcutaneous heparin. Monitor platelet counts and any signs of bleeding 3. Pain Management/chronic back pain: Oxycodone as needed  Voltaren gel for right knee - pain improved 4. Hypertension/ischemic cardiomyopathy. Amiodarone 400 mg daily, Coreg 25 mg daily, Lasix 40 mg daily,BIDIL 20-30 7.5 mg tablet 0.5 3 times a day.   Continues to be labile, pt asyptomatic   Filed Vitals:   03/02/16 1733 03/03/16 0514  BP: 110/57 129/54  Pulse: 87 76  Temp:  98.9 F (37.2 C)  Resp:  20   5. Neuropsych: This patient is capable of making decisions on his own behalf. 6. Skin/Wound Care: Routine skin checks 7. Fluids/Electrolytes/Nutrition: Routine I&O   Hyponatremia: 135 on 6/5, will continue to monitor 8. Chronic renal insufficiency. Baseline creatinine 1.80-2.00.   Creatinine 1.54 on 6/5 9. Systolic congestive heart failure. Continue Lasix 40 mg daily. The patient daily 10. Hypothyroidism. Synthroid. TSH 2.861 11. 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 Additionally, there is hematoma overlying the psoas muscles.RIghtL2,3 nerve roots primarily affected  -cont conservative mgt for now  -Hb 9.2 on 6/5 12. DM type 2  HbA1c 7.1 on 5/18   CBG (last 3)   Recent Labs  03/02/16 1630 03/02/16 2024 03/03/16 0659  GLUCAP 108* 100* 95     Will cont to monitor and initiate medications if warranted 13. Obesity  Body mass index is 33.45 kg/(m^2).  Diet and exercise education  Will cont to encourage weight loss to increase endurance and promote overall health  LOS (Days) 10 A FACE TO FACE EVALUATION WAS PERFORMED  KIRSTEINS,ANDREW E 03/03/2016 7:59 AM

## 2016-03-04 ENCOUNTER — Inpatient Hospital Stay (HOSPITAL_COMMUNITY): Payer: BC Managed Care – PPO | Admitting: Physical Therapy

## 2016-03-04 LAB — GLUCOSE, CAPILLARY
Glucose-Capillary: 100 mg/dL — ABNORMAL HIGH (ref 65–99)
Glucose-Capillary: 115 mg/dL — ABNORMAL HIGH (ref 65–99)
Glucose-Capillary: 99 mg/dL (ref 65–99)
Glucose-Capillary: 115 mg/dL — ABNORMAL HIGH (ref 65–99)

## 2016-03-04 NOTE — Progress Notes (Signed)
Physical Therapy Session Note  Patient Details  Name: Eric [name] MRN: [id number] Date of Birth: 06-20-1951  Today's Date: 03/04/2016 PT Individual Time: 0172-4195 PT Individual Time Calculation (min): 30 min   Short Term Goals: Week 1:  PT Short Term Goal 1 (Week 1): Patient will perform Bed mobility with min A From PT and slight use of bed features.  PT Short Term Goal 1 - Progress (Week 1): Met PT Short Term Goal 2 (Week 1): Patient will perform Squat pivot/stand pivot transfers with mod A from PT.  PT Short Term Goal 2 - Progress (Week 1): Met PT Short Term Goal 3 (Week 1): Patient will perform WC mobility with supervision A for 160f with with BLE and R UE technique.  PT Short Term Goal 3 - Progress (Week 1): Progressing toward goal Week 2:  PT Short Term Goal 1 (Week 2): STG = LTG due to anticipated d/c date. Week 3:     Skilled Therapeutic Interventions/Progress Updates:    Patient received sitting in WC and agreeable to PT. PT instructed patient in Gait training for 1764fx 2 with RW and supervision from PT with min cues for improved step length cadence.   LE therex seated in chair: AAROM hip flexion x 6 BLE  LLE knee extension x 12  R LE knee flexion/extesion on towel to reduce friction x 10  Isometric hip abduction x 12 on beach ball  Hip abduction on level 2 tband x 10  PT provided min cues throughout therex for improved posture, decreased compensation from trunk, and improved ROM as tolerated.   [facility].   Therapy Documentation Precautions:  Precautions Precautions: ICD/Pacemaker Precaution Comments: per Dr. PaRolanda LundborgE is WBAT as of 5/31 Restrictions Weight Bearing Restrictions: Yes LUE Weight Bearing: Weight bearing as tolerated   Pain: Pain Assessment Pain Score: 2  See Function Navigator for Current Functional Status.   Therapy/Group: Individual Therapy  AuLorie Phenix/06/2016, 12:55 PM

## 2016-03-04 NOTE — Progress Notes (Signed)
[facility]     PROGRESS NOTE  Subjective/Complaints:  Feels that he's getting stronger. Able to sleep. Pain controlled  ROS: Denies CP, SOB, nausea, vomiting, diarrhea.  Objective: Vital Signs: Blood pressure 123/57, pulse 76, temperature 98.3 F (36.8 C), temperature source Oral, resp. rate 18, height 5\' 9"  (1.753 m), weight 100.4 kg (221 lb 5.5 oz), SpO2 100 %. No results found. No results for input(s): WBC, HGB, HCT, PLT in the last 72 hours. No results for input(s): NA, K, CL, GLUCOSE, BUN, CREATININE, CALCIUM in the last 72 hours.  Invalid input(s): CO CBG (last 3)   Recent Labs  03/03/16 1643 03/03/16 2053 03/04/16 0643  GLUCAP 97 108* 100*    Wt Readings from Last 3 Encounters:  03/04/16 100.4 kg (221 lb 5.5 oz)  02/22/16 107.094 kg (236 lb 1.6 oz)  12/31/15 109.825 kg (242 lb 1.9 oz)    Physical Exam:  BP 123/57 mmHg  Pulse 76  Temp(Src) 98.3 F (36.8 C) (Oral)  Resp 18  Ht 5\' 9"  (1.753 m)  Wt 100.4 kg (221 lb 5.5 oz)  BMI 32.67 kg/m2  SpO2 100% Constitutional: He appears well-developed and well-nourished. NAD. HENT: Normocephalic and atraumatic.  Eyes: EOM and conjunctiva are normal.   Cardiovascular: Normal rate. Exam reveals no friction rub. No murmur heard. Respiratory: No respiratory distress. He has no wheezes. He has no rales.  Musculoskeletal:  Pain along both lateral hips/low back with palpation and hip flexion, improved.  No pain with palpation of right knee. No edema.  Neurological:  Diminished sensation to light touch bilateral anterior and lateral thighs, right greater than left (Improving) Motor: Bilateral upper extremities 5/5 proximal to distal Right lower extremity: Hip flexion and Adduction 2/5 (improving), knee extension 4/5, ankle dorsi/plantar flexion 4+/5 Left lower extremity: Hip flexion and adduction remains 3-/5, knee extension 4+/5, ankle dorsi/plantar flexion 4+/5 Skin: Pacemaker site  C/D/I   Assessment/Plan: 1. Functional deficits secondary to cardiac arrest/CPR status post CRT-D which require 3+ hours per day of interdisciplinary therapy in a comprehensive inpatient rehab setting. Physiatrist is providing close team supervision and 24 hour management of active medical problems listed below. Physiatrist and rehab team continue to assess barriers to discharge/monitor patient progress toward functional and medical goals.  Function:  Bathing Bathing position   Position: Shower  Bathing parts Body parts bathed by patient: Right arm, Left arm, Chest, Abdomen, Buttocks, Front perineal area, Left upper leg, Right upper leg Body parts bathed by helper: Right lower leg, Left lower leg, Back  Bathing assist Assist Level: Touching or steadying assistance(Pt > 75%)      Upper Body Dressing/Undressing Upper body dressing   What is the patient wearing?: Pull over shirt/dress     Pull over shirt/dress - Perfomed by patient: Thread/unthread right sleeve, Thread/unthread left sleeve, Put head through opening, Pull shirt over trunk Pull over shirt/dress - Perfomed by helper: Pull shirt over trunk        Upper body assist Assist Level: Set up   Set up : To obtain clothing/put away  Lower Body Dressing/Undressing Lower body dressing   What is the patient wearing?: Shoes, [employer], Pants Underwear - Performed by patient: Pull underwear up/down, Thread/unthread left underwear leg, Thread/unthread right underwear leg Underwear - Performed by helper: Pull underwear up/down, Thread/unthread left underwear leg, Thread/unthread right underwear leg Pants- Performed by patient: Thread/unthread right pants leg, Thread/unthread left pants leg, Pull pants up/down Pants- Performed by helper: Thread/unthread left pants  leg, Pull pants up/down   Non-skid slipper socks- Performed by helper: Don/doff right sock, Don/doff left sock     Shoes - Performed by patient: Don/doff right shoe,  Don/doff left shoe, Fasten right, Fasten left Shoes - Performed by helper: Don/doff right shoe, Don/doff left shoe, Fasten right, Fasten left       TED Hose - Performed by helper: Don/doff left TED hose, Don/doff right TED hose  Lower body assist Assist for lower body dressing: Touching or steadying assistance (Pt > 75%)   Set up : To obtain clothing/put away, Don/doff TED stockings  Toileting Toileting Toileting activity did not occur: No continent bowel/bladder event Toileting steps completed by patient: Adjust clothing prior to toileting, Performs perineal hygiene, Adjust clothing after toileting Toileting steps completed by helper: Adjust clothing after toileting Toileting Assistive Devices: Grab bar or rail  Toileting assist Assist level: More than reasonable time, Supervision or verbal cues   Transfers Chair/bed transfer   Chair/bed transfer method: Stand pivot Chair/bed transfer assist level: Supervision or verbal cues Chair/bed transfer assistive device: Walker, Air cabin crew     Max distance: 12ft Assist level: Supervision or verbal cues   Wheelchair   Type: Manual Max wheelchair distance: 156ft Assist Level: Supervision or verbal cues  Cognition Comprehension Comprehension assist level: Follows complex conversation/direction with no assist  Expression Expression assist level: Expresses complex ideas: With extra time/assistive device  Social Interaction Social Interaction assist level: Interacts appropriately 90% of the time - Needs monitoring or encouragement for participation or interaction.  Problem Solving Problem solving assist level: Solves complex problems: With extra time  Memory Memory assist level: Recognizes or recalls 90% of the time/requires cueing < 10% of the time    Medical Problem List and Plan: 1. 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 DVT Prophylaxis/Anticoagulation: Subcutaneous heparin. Monitor platelet counts and any signs of bleeding 3. Pain Management/chronic back pain: Oxycodone as needed  Voltaren gel for right knee - pain improved 4. Hypertension/ischemic cardiomyopathy. Amiodarone 400 mg daily, Coreg 25 mg daily, Lasix 40 mg daily,BIDIL 20-30 7.5 mg tablet 0.5 3 times a day.   Continues to be labile, pt asyptomatic   Filed Vitals:   03/03/16 1348 03/04/16 0533  BP: 109/53 123/57  Pulse: 93 76  Temp: 98.4 F (36.9 C) 98.3 F (36.8 C)  Resp: 18 18   5. Neuropsych: This patient is capable of making decisions on his own behalf. 6. Skin/Wound Care: Routine skin checks 7. Fluids/Electrolytes/Nutrition: Routine I&O   Hyponatremia: 135 on 6/5, will continue to monitor 8. Chronic renal insufficiency. Baseline creatinine 1.80-2.00.   Creatinine 1.54 on 6/5 9. Systolic congestive heart failure. Continue Lasix 40 mg daily. The patient daily 10. Hypothyroidism. Synthroid. TSH 2.861 11. 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 Additionally, there is hematoma overlying the psoas muscles.RIghtL2,3 nerve roots primarily affected  -cont conservative mgt for now  -Hb 9.2 on 6/5 12. DM type 2  HbA1c 7.1 on 5/18   CBG (last 3)   Recent Labs  03/03/16 1643 03/03/16 2053 03/04/16 0643  GLUCAP 97 108* 100*     Will cont to monitor and initiate medications if warranted 13. Obesity  Body mass index is 32.67 kg/(m^2).  Diet and exercise education  Will cont to encourage weight loss  to increase endurance and promote overall health  LOS (Days) 11 A FACE TO FACE EVALUATION WAS PERFORMED  Shelbia Scinto T 03/04/2016 8:51 AM

## 2016-03-05 ENCOUNTER — Inpatient Hospital Stay (HOSPITAL_COMMUNITY): Payer: BC Managed Care – PPO | Admitting: Physical Therapy

## 2016-03-05 DIAGNOSIS — M10071 Idiopathic gout, right ankle and foot: Secondary | ICD-10-CM

## 2016-03-05 DIAGNOSIS — N189 Chronic kidney disease, unspecified: Secondary | ICD-10-CM

## 2016-03-05 LAB — GLUCOSE, CAPILLARY
GLUCOSE-CAPILLARY: 85 mg/dL (ref 65–99)
GLUCOSE-CAPILLARY: 107 mg/dL — AB (ref 65–99)
GLUCOSE-CAPILLARY: 116 mg/dL — AB (ref 65–99)
Glucose-Capillary: 126 mg/dL — ABNORMAL HIGH (ref 65–99)

## 2016-03-05 MED ORDER — PREDNISONE 10 MG PO TABS
10.0000 mg | ORAL_TABLET | Freq: Two times a day (BID) | ORAL | Status: AC
  Administered 2016-03-05 (×2): 10 mg via ORAL
  Filled 2016-03-05: qty 1

## 2016-03-05 NOTE — Progress Notes (Signed)
New right ankle pain, started Friday night. Patient reports H/O gout. Eric [name] A

## 2016-03-05 NOTE — Progress Notes (Signed)
PHYSICAL MEDICINE & REHABILITATION     PROGRESS NOTE  Subjective/Complaints:  Feels that he's getting stronger. Able to sleep. Pain controlled  ROS: Denies CP, SOB, nausea, vomiting, diarrhea.  Objective: Vital Signs: Blood pressure 137/62, pulse 73, temperature 98.3 F (36.8 C), temperature source Oral, resp. rate 18, height 5\' 9"  (1.753 m), weight 101.7 kg (224 lb 3.3 oz), SpO2 100 %. No results found. No results for input(s): WBC, HGB, HCT, PLT in the last 72 hours. No results for input(s): NA, K, CL, GLUCOSE, BUN, CREATININE, CALCIUM in the last 72 hours.  Invalid input(s): CO CBG (last 3)   Recent Labs  03/04/16 1633 03/04/16 2100 03/05/16 0652  GLUCAP 99 115* 85    Wt Readings from Last 3 Encounters:  03/05/16 101.7 kg (224 lb 3.3 oz)  02/22/16 107.094 kg (236 lb 1.6 oz)  12/31/15 109.825 kg (242 lb 1.9 oz)    Physical Exam:  BP 137/62 mmHg  Pulse 73  Temp(Src) 98.3 F (36.8 C) (Oral)  Resp 18  Ht 5\' 9"  (1.753 m)  Wt 101.7 kg (224 lb 3.3 oz)  BMI 33.09 kg/m2  SpO2 100% Constitutional: He appears well-developed and well-nourished. NAD. HENT: Normocephalic and atraumatic.  Eyes: EOM and conjunctiva are normal.   Cardiovascular: Normal rate. Exam reveals no friction rub. No murmur heard. Respiratory: No respiratory distress. He has no wheezes. He has no rales.  Musculoskeletal:  Pain along both lateral hips/low back with palpation and hip flexion, improved.  No pain with palpation of right knee. Mild pain just superior to right medial malleolus--minimal warmth,no surrounding edema Neurological:  Diminished sensation to light touch bilateral anterior and lateral thighs, right greater than left (Improving) Motor: Bilateral upper extremities 5/5 proximal to distal Right lower extremity: Hip flexion and Adduction 2/5 (improving), knee extension 4/5, ankle dorsi/plantar flexion 4+/5 Left lower extremity: Hip flexion and adduction remains 3-/5, knee  extension 4+/5, ankle dorsi/plantar flexion 4+/5 Skin: Pacemaker site C/D/I   Assessment/Plan: 1. Functional deficits secondary to cardiac arrest/CPR status post CRT-D which require 3+ hours per day of interdisciplinary therapy in a comprehensive inpatient rehab setting. Physiatrist is providing close team supervision and 24 hour management of active medical problems listed below. Physiatrist and rehab team continue to assess barriers to discharge/monitor patient progress toward functional and medical goals.  Function:  Bathing Bathing position   Position: Shower  Bathing parts Body parts bathed by patient: Right arm, Left arm, Chest, Abdomen, Buttocks, Front perineal area, Left upper leg, Right upper leg Body parts bathed by helper: Right lower leg, Left lower leg, Back  Bathing assist Assist Level: Touching or steadying assistance(Pt > 75%)      Upper Body Dressing/Undressing Upper body dressing   What is the patient wearing?: Pull over shirt/dress     Pull over shirt/dress - Perfomed by patient: Thread/unthread right sleeve, Thread/unthread left sleeve, Put head through opening, Pull shirt over trunk Pull over shirt/dress - Perfomed by helper: Pull shirt over trunk        Upper body assist Assist Level: Set up   Set up : To obtain clothing/put away  Lower Body Dressing/Undressing Lower body dressing   What is the patient wearing?: Shoes, [employer], Pants Underwear - Performed by patient: Pull underwear up/down, Thread/unthread left underwear leg, Thread/unthread right underwear leg Underwear - Performed by helper: Pull underwear up/down, Thread/unthread left underwear leg, Thread/unthread right underwear leg Pants- Performed by patient: Thread/unthread right pants leg, Thread/unthread left pants leg, Pull pants  up/down Pants- Performed by helper: Thread/unthread left pants leg, Pull pants up/down   Non-skid slipper socks- Performed by helper: Don/doff right sock, Don/doff  left sock     Shoes - Performed by patient: Don/doff right shoe, Don/doff left shoe, Fasten right, Fasten left Shoes - Performed by helper: Don/doff right shoe, Don/doff left shoe, Fasten right, Fasten left       TED Hose - Performed by helper: Don/doff left TED hose, Don/doff right TED hose  Lower body assist Assist for lower body dressing: Touching or steadying assistance (Pt > 75%)   Set up : To obtain clothing/put away, Don/doff TED stockings  Toileting Toileting Toileting activity did not occur: No continent bowel/bladder event Toileting steps completed by patient: Adjust clothing prior to toileting, Performs perineal hygiene, Adjust clothing after toileting Toileting steps completed by helper: Adjust clothing after toileting Toileting Assistive Devices: Grab bar or rail  Toileting assist Assist level: More than reasonable time, Supervision or verbal cues   Transfers Chair/bed transfer   Chair/bed transfer method: Ambulatory Chair/bed transfer assist level: Supervision or verbal cues Chair/bed transfer assistive device: Walker, Air cabin crew     Max distance: 167ft Assist level: Supervision or verbal cues   Wheelchair   Type: Manual Max wheelchair distance: 143ft Assist Level: Supervision or verbal cues  Cognition Comprehension Comprehension assist level: Follows complex conversation/direction with extra time/assistive device  Expression Expression assist level: Expresses complex ideas: With extra time/assistive device  Social Interaction Social Interaction assist level: Interacts appropriately 90% of the time - Needs monitoring or encouragement for participation or interaction.  Problem Solving Problem solving assist level: Solves complex 90% of the time/cues < 10% of the time  Memory Memory assist level: Recognizes or recalls 90% of the time/requires cueing < 10% of the time    Medical Problem List and Plan: 1. LE weakness secondary to cardiac  arrest/CPR status post CRT-D 02/17/2016 and retroperitoneal hematoma -continue CIR therapies  -?knee brace to help support quad 2. DVT Prophylaxis/Anticoagulation: Subcutaneous heparin. Monitor platelet counts and any signs of bleeding 3. Pain Management/chronic back pain: Oxycodone as needed  Voltaren gel for right knee - pain improved  -right ankle pain. ?mild gout flare (has hx)---will try two doses of prednisone today and observe for response   -watch cbg's 4. Hypertension/ischemic cardiomyopathy. Amiodarone 400 mg daily, Coreg 25 mg daily, Lasix 40 mg daily,BIDIL 20-30 7.5 mg tablet 0.5 3 times a day.   Continues to be labile, pt asyptomatic   Filed Vitals:   03/04/16 2004 03/05/16 0508  BP: 117/55 137/62  Pulse: 75 73  Temp:  98.3 F (36.8 C)  Resp:  18   5. Neuropsych: This patient is capable of making decisions on his own behalf. 6. Skin/Wound Care: Routine skin checks 7. Fluids/Electrolytes/Nutrition: Routine I&O   Hyponatremia: 135 on 6/5, recheck tomorrow 8. Chronic renal insufficiency. Baseline creatinine 1.80-2.00.   Creatinine 1.54 on 6/5  -recheck tomorrow 9. Systolic congestive heart failure. Continue Lasix 40 mg daily. The patient daily 10. Hypothyroidism. Synthroid. TSH 2.861 11. 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 Additionally, there is hematoma overlying the psoas muscles.RIghtL2,3 nerve roots primarily affected  -cont conservative mgt for now  -Hb 9.2 on 6/5 12. DM type 2  HbA1c 7.1 on 5/18   CBG (last 3)   Recent Labs  03/04/16 1633 03/04/16 2100 03/05/16 0652  GLUCAP 99 115* 85  Will cont to monitor and initiate medications if warranted 13. Obesity  Body mass index is 33.09 kg/(m^2).  Diet and exercise education  Will cont to encourage weight loss to increase endurance and promote overall  health  LOS (Days) 12 A FACE TO FACE EVALUATION WAS PERFORMED  Rashidah Belleville T 03/05/2016 8:33 AM

## 2016-03-05 NOTE — Progress Notes (Signed)
Physical Therapy Session Note  Patient Details  Name: Eric [name] MRN: [id number] Date of Birth: 04-11-1951  Today's Date: 03/05/2016 PT Individual Time: [id number] PT Individual Time Calculation (min): 55 min   Short Term Goals: Week 2:  PT Short Term Goal 1 (Week 2): STG = LTG due to anticipated d/c date.  Skilled Therapeutic Interventions/Progress Updates:    Pt received in w/c & agreeable to PT, denying c/o pain but only reporting tightness in R ankle. Gait training x 150 ft room>gym with RW & supervision. Pt transferred supine on mat table with supervision & performed the following AAROM exercises: heel slides, hip abduction/adduction, and straight leg raises. Pt with improving muscle activation in quadriceps on this date, but continues to have weak L hip adductors. Educated pt on falls at home and situations in which he should EMS; pt able to verbalize 2/4 during session. Pt required max A to complete floor>sitting edge of mat transfer & multiple attempts. Pt with BLE weakness causing him to rely heavily on BUE's for transfer. Pt with success when advancing & pushing off of LLE. After rest break pt ambulated 150 ft back to room with RW & supervision A. Pt left in w/c with all needs within reach at end of session.   Therapy Documentation Precautions:  Precautions Precautions: ICD/Pacemaker Precaution Comments: per Dr. [name] UE is WBAT as of 5/31 Restrictions Weight Bearing Restrictions: Yes LUE Weight Bearing: Weight bearing as tolerated  Pain: Pain Assessment Pain Assessment: No/denies pain  See Function Navigator for Current Functional Status.   Therapy/Group: Individual Therapy  [name] 03/05/2016, 12:53 PM

## 2016-03-06 ENCOUNTER — Inpatient Hospital Stay (HOSPITAL_COMMUNITY): Payer: BC Managed Care – PPO | Admitting: Occupational Therapy

## 2016-03-06 ENCOUNTER — Inpatient Hospital Stay (HOSPITAL_COMMUNITY): Payer: BC Managed Care – PPO | Admitting: Physical Therapy

## 2016-03-06 DIAGNOSIS — M10071 Idiopathic gout, right ankle and foot: Secondary | ICD-10-CM | POA: Insufficient documentation

## 2016-03-06 LAB — BASIC METABOLIC PANEL
ANION GAP: 7 (ref 5–15)
BUN: 24 mg/dL — AB (ref 6–20)
CO2: 23 mmol/L (ref 22–32)
Calcium: 9.1 mg/dL (ref 8.9–10.3)
Chloride: 107 mmol/L (ref 101–111)
Creatinine, Ser: 1.67 mg/dL — ABNORMAL HIGH (ref 0.61–1.24)
GFR, EST AFRICAN AMERICAN: 48 mL/min — AB (ref >60)
GFR, EST NON AFRICAN AMERICAN: 42 mL/min — AB (ref >60)
Glucose, Bld: 110 mg/dL — ABNORMAL HIGH (ref 65–99)
POTASSIUM: 3.9 mmol/L (ref 3.5–5.1)
SODIUM: 137 mmol/L (ref 135–145)

## 2016-03-06 LAB — GLUCOSE, CAPILLARY
GLUCOSE-CAPILLARY: 100 mg/dL — AB (ref 65–99)
GLUCOSE-CAPILLARY: 109 mg/dL — AB (ref 65–99)

## 2016-03-06 MED ORDER — PREDNISONE 10 MG PO TABS
5.0000 mg | ORAL_TABLET | Freq: Two times a day (BID) | ORAL | Status: AC
  Administered 2016-03-06: 5 mg via ORAL
  Filled 2016-03-06: qty 1

## 2016-03-06 NOTE — Progress Notes (Signed)
Occupational Therapy Session Note  Patient Details  Name: Eric [name] MRN: [id number] Date of Birth: September 18, 1951  Today's Date: 03/06/2016 OT Individual Time: 1500-1545 OT Individual Time Calculation (min): 45 min    Short Term Goals: Week 2:  OT Short Term Goal 1 (Week 2): LTG=STG  Skilled Therapeutic Interventions/Progress Updates:    Pt seen for OT session focusing on functional activity tolerance and community mobility. Pt sitting up in w/c upon arrival, agreeable to tx session. Pt taken off unit to hospital gift store. Total A in w/c to gift store for time and energy conservation. Pt ambulated throughout gift store x2 trials with extended seated rest break provided btwn trials. Pt able to manage RW throughout crowded store at supervision level. He practiced obtaining items from overhead and from low shelf. VCs and min A provided for pt to squat to retrieve items from low shelf.  Pt taken back to unit in w/c. He ambulated ~25 yards throughout unit back to room. Left sitting in w/c with all needs in reach. Following extended ambulation/ standing pt voiced increased discomfort in R knee and ankle, he declined ice or repositioning, desiring to just rest a RN provided medication prior to tx session.   Therapy Documentation Precautions:  Precautions Precautions: ICD/Pacemaker Precaution Comments: per Dr. [name] UE is WBAT as of 5/31 Restrictions Weight Bearing Restrictions: Yes LUE Weight Bearing: Weight bearing as tolerated  See Function Navigator for Current Functional Status.   Therapy/Group: Individual Therapy  [name], [name] 03/06/2016, 7:26 AM

## 2016-03-06 NOTE — Progress Notes (Signed)
Social Work Patient ID: Eric [name], male   DOB: 26-Dec-1950, 65 y.o.   MRN: [id number] Spoke with wife regarding family education tomorrow, have scheduled for tomorrow at 9:30. 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 Discussed team feels he will reach his goals on Friday and reason moved up is his function. Will ask insurance but will probably not grant wife her request. Pt feels if he needs to stay longer that should be accommodated. Offered for them if insurance denies extra days to pay privately for these. Will see wife tomorrow.

## 2016-03-06 NOTE — Progress Notes (Signed)
Physical Therapy Session Note  Patient Details  Name: Eric [name] MRN: [id number] Date of Birth: [birth year]  Today's Date: 03/06/2016 PT Individual Time: 1015-1100 PT Individual Time Calculation (min): 45 min   Therapy Documentation Precautions:  Precautions Precautions: ICD/Pacemaker Precaution Comments: per Dr. [name] UE is WBAT as of 5/31 Restrictions Weight Bearing Restrictions: Yes LUE Weight Bearing: Weight bearing as tolerated  Pain: Pain Assessment Pain Assessment: 0-10 Pain Score: 3  Pain Type: Acute pain Pain Location: Ankle Pain Orientation: Right Pain Intervention(s): Relaxation;Rest   Patient performed toilet transfer to elevated toilet with RW supervision. Patient performed clothing management supervision. Patient performed hygiene mod I on elevated toilet seat/ Patient continent of bowel and bladder.   Sit to and from stand transfer with RW close supervision  Patient ambulated 250 feetx2 with RW close supervision. Verbal cues for upright posture, step length, environmental awareness and obstacle negotiation. Patient demonstrates slowed pace and decreased cadence.   Patient up and down 12 steps with bilateral handrails close supervision. Patient performed stairs with a step to pattern. Verbal cues for sequence and technique.   Standing there ex with RW: B heel raises 3x10 Mini squats 15x2  Patient returned to room at end of session with all needs met and resting comfortably. Patient initially reported right ankle pain that subsided as session progressed. Patient tolerated session well with rest breaks throughout. Patient did experience left knee bucking however was able to recover without increased assistance.      See Function Navigator for Current Functional Status.   Therapy/Group: Individual Therapy  Retta Diones 03/06/2016, 2:25 PM

## 2016-03-06 NOTE — Progress Notes (Signed)
Occupational Therapy Session Note  Patient Details  Name: Eric [name] MRN: [id number] Date of Birth: Jan 14, 1951  Today's Date: 03/06/2016 OT Individual Time: [id number] OT Individual Time Calculation (min): 58 min    Short Term Goals: Week 2:  OT Short Term Goal 1 (Week 2): LTG=STG  Skilled Therapeutic Interventions/Progress Updates:    Treatment session with focus on sit > stand, standing tolerance, and balance during self-care tasks.  Pt reporting pain and stiffness in Rt knee and ankle, RN aware, but willing to participate in bathing at shower level.  Ambulated with RW into bathroom with contact guard.  Pt recalling technique to complete transfer into room shower without cues from therapist.  Bathing completed at sit > stand level with pt tolerating 6 mins standing while washing and then drying buttocks.  Noted heavy reliance on grab bars with sit > stand from shower seat, educated on increased ease of standing from higher surfaces and to consider with shower seat upon d/c. Therapist assisted with threading Rt pant leg due to stiffness and difficulty picking up RLE.  Shaving task completed in standing at sink with pt maintaining standing for 8 mins.    Therapy Documentation Precautions:  Precautions Precautions: ICD/Pacemaker Precaution Comments: per Dr. [name] UE is WBAT as of 5/31 Restrictions Weight Bearing Restrictions: Yes LUE Weight Bearing: Weight bearing as tolerated General:   Vital Signs: Therapy Vitals Temp: 98.2 F (36.8 C) Temp Source: Oral Pulse Rate: 75 Resp: 17 BP: 135/69 mmHg Patient Position (if appropriate): Lying Oxygen Therapy SpO2: 99 % O2 Device: Not Delivered Pain:  Pt reports pain in Rt knee and ankle, RN and MD aware  See Function Navigator for Current Functional Status.   Therapy/Group: Individual Therapy  [name] 03/06/2016, 8:34 AM

## 2016-03-06 NOTE — Progress Notes (Signed)
Physical Therapy Session Note  Patient Details  Name: Eric [name] MRN: [id number] Date of Birth: 10/18/1950  Today's Date: 03/06/2016 PT Individual Time: [id number] PT Individual Time Calculation (min): 58 min   Short Term Goals: Week 2:  PT Short Term Goal 1 (Week 2): STG = LTG due to anticipated d/c date.  Skilled Therapeutic Interventions/Progress Updates:    Pt received in w/c & agreeable to PT, noting 3/10 R ankle pain. Pt reports he is anxious about d/c home on Friday because family is in process of moving houses. Discussed problem solving with pt & educated pt to have family move boxes & other objects out of pathway to allow him to safely ambulate in either home. Pt plans to have 2nd rail installed at new house but is unsure when this will be completed, therefore both houses only have a single rail he can hold to at once. Gait training x 150 ft room>gym with RW & distant supervision with minimal R knee hyperextension noted. Utilized nu-step level 4 x 13 minutes without BUE support for LE strengthening. Educated pt on stair negotiation with single rail (R ascending) while holding on with BUE. Pt able to negotiate 3 steps + 3 steps in this manner with supervision. Practiced floor transfer & pt able to transfer supine>prone>quadruped with supervision A. Pt required mod A to transfer from quadruped>sitting on edge of mat with BUE support and Mod A. Pt continues to have difficulty placing BLE on floor 2/2 impaired sensation & relies heavily on BUE for transfer. Gait training x 100 ft back to room & at end of session pt left in w/c with all needs within reach.  Therapy Documentation Precautions:  Precautions Precautions: ICD/Pacemaker Precaution Comments: per Dr. [name] UE is WBAT as of 5/31 Restrictions Weight Bearing Restrictions: Yes LUE Weight Bearing: Weight bearing as tolerated  Pain: Pain Assessment Pain Assessment: 0-10 Pain Score: 3  Pain Location: Ankle Pain Orientation:  Right Pain Intervention(s): Ambulation/increased activity   See Function Navigator for Current Functional Status.   Therapy/Group: Individual Therapy  [name] 03/06/2016, 7:58 AM

## 2016-03-06 NOTE — Progress Notes (Signed)
[facility]     PROGRESS NOTE  Subjective/Complaints:  Pt seen after taking a bath this AM.  He notes right ankle pain with a history of gout.    ROS: +Right ankle and knee pain. Denies CP, SOB, nausea, vomiting, diarrhea.  Objective: Vital Signs: Blood pressure 135/69, pulse 75, temperature 98.2 F (36.8 C), temperature source Oral, resp. rate 17, height 5\' 9"  (1.753 m), weight 101.2 kg (223 lb 1.7 oz), SpO2 99 %. No results found. No results for input(s): WBC, HGB, HCT, PLT in the last 72 hours.  Recent Labs  03/06/16 0530  NA 137  K 3.9  CL 107  GLUCOSE 110*  BUN 24*  CREATININE 1.67*  CALCIUM 9.1   CBG (last 3)   Recent Labs  03/05/16 1623 03/05/16 2126 03/06/16 0656  GLUCAP 126* 116* 100*    Wt Readings from Last 3 Encounters:  03/06/16 101.2 kg (223 lb 1.7 oz)  02/22/16 107.094 kg (236 lb 1.6 oz)  12/31/15 109.825 kg (242 lb 1.9 oz)    Physical Exam:  BP 135/69 mmHg  Pulse 75  Temp(Src) 98.2 F (36.8 C) (Oral)  Resp 17  Ht 5\' 9"  (1.753 m)  Wt 101.2 kg (223 lb 1.7 oz)  BMI 32.93 kg/m2  SpO2 99% Constitutional: He appears well-developed and well-nourished. NAD. HENT: Normocephalic and atraumatic.  Eyes: EOM and conjunctiva are normal.   Cardiovascular: Normal rate. Exam reveals no friction rub. No murmur heard. Respiratory: No respiratory distress. He has no wheezes. He has no rales.  Musculoskeletal:  Mild pain just superior to right medial malleolus--minimal warmth,no surrounding edema Neurological:  Diminished sensation to light touch bilateral anterior and lateral thighs, right greater than left (Improving) Motor: Bilateral upper extremities 5/5 proximal to distal Right lower extremity: Hip flexion 2+/5, knee extension 3-/5, ankle dorsi/plantar flexion 4/5 Left lower extremity: Hip flexion 4-/5 proximal to distal, knee extension 4+/5, ankle dorsi/plantar flexion 5/5  Skin: Pacemaker site C/D/I    Assessment/Plan: 1. Functional deficits secondary to cardiac arrest/CPR status post CRT-D which require 3+ hours per day of interdisciplinary therapy in a comprehensive inpatient rehab setting. Physiatrist is providing close team supervision and 24 hour management of active medical problems listed below. Physiatrist and rehab team continue to assess barriers to discharge/monitor patient progress toward functional and medical goals.  Function:  Bathing Bathing position   Position: Shower  Bathing parts Body parts bathed by patient: Right arm, Left arm, Chest, Abdomen, Buttocks, Front perineal area, Left upper leg, Right upper leg, Right lower leg, Left lower leg, Back Body parts bathed by helper: Right lower leg, Left lower leg, Back  Bathing assist Assist Level: Touching or steadying assistance(Pt > 75%)      Upper Body Dressing/Undressing Upper body dressing   What is the patient wearing?: Pull over shirt/dress     Pull over shirt/dress - Perfomed by patient: Thread/unthread right sleeve, Thread/unthread left sleeve, Put head through opening, Pull shirt over trunk Pull over shirt/dress - Perfomed by helper: Pull shirt over trunk        Upper body assist Assist Level: Set up   Set up : To obtain clothing/put away  Lower Body Dressing/Undressing Lower body dressing   What is the patient wearing?: Pants, [employer], Shoes Underwear - Performed by patient: Pull underwear up/down, Thread/unthread left underwear leg, Thread/unthread right underwear leg Underwear - Performed by helper: Pull underwear up/down, Thread/unthread left underwear leg, Thread/unthread right underwear leg Pants- Performed by patient:  Thread/unthread right pants leg, Thread/unthread left pants leg, Pull pants up/down Pants- Performed by helper: Thread/unthread left pants leg, Pull pants up/down   Non-skid slipper socks- Performed by helper: Don/doff right sock, Don/doff left sock     Shoes - Performed by  patient: Don/doff right shoe, Don/doff left shoe, Fasten right, Fasten left Shoes - Performed by helper: Don/doff right shoe, Don/doff left shoe, Fasten right, Fasten left       TED Hose - Performed by helper: Don/doff left TED hose, Don/doff right TED hose  Lower body assist Assist for lower body dressing: Touching or steadying assistance (Pt > 75%)   Set up : To obtain clothing/put away, Don/doff TED stockings  Toileting Toileting Toileting activity did not occur: No continent bowel/bladder event Toileting steps completed by patient: Adjust clothing prior to toileting, Performs perineal hygiene, Adjust clothing after toileting Toileting steps completed by helper: Adjust clothing after toileting Toileting Assistive Devices: Grab bar or rail  Toileting assist Assist level: Supervision or verbal cues   Transfers Chair/bed transfer   Chair/bed transfer method: Ambulatory Chair/bed transfer assist level: Supervision or verbal cues Chair/bed transfer assistive device: Walker, Air cabin crew     Max distance: 150 ft Assist level: Supervision or verbal cues   Wheelchair   Type: Manual Max wheelchair distance: 170ft Assist Level: Supervision or verbal cues  Cognition Comprehension Comprehension assist level: Follows complex conversation/direction with no assist  Expression Expression assist level: Expresses complex ideas: With extra time/assistive device  Social Interaction Social Interaction assist level: Interacts appropriately 90% of the time - Needs monitoring or encouragement for participation or interaction.  Problem Solving Problem solving assist level: Solves complex 90% of the time/cues < 10% of the time  Memory Memory assist level: Recognizes or recalls 90% of the time/requires cueing < 10% of the time    Medical Problem List and Plan: 1. LE weakness secondary to cardiac arrest/CPR status post CRT-D 02/17/2016 and retroperitoneal  hematoma -continue CIR therapies 2. DVT Prophylaxis/Anticoagulation: Subcutaneous heparin. Monitor platelet counts and any signs of bleeding 3. Pain Management/chronic back pain: Oxycodone as needed  Voltaren gel for right knee - pain improved  -right ankle pain. ?mild gout flare (has hx)---improved with prednisone  4. Hypertension/ischemic cardiomyopathy. Amiodarone 400 mg daily, Coreg 25 mg daily, Lasix 40 mg daily,BIDIL 20-30 7.5 mg tablet 0.5 3 times a day.   Continues to be labile, pt asyptomatic Filed Vitals:   03/06/16 0618 03/06/16 0829  BP: 138/64 135/69  Pulse: 73 75  Temp: 98.2 F (36.8 C)   Resp: 17   5. Neuropsych: This patient is capable of making decisions on his own behalf. 6. Skin/Wound Care: Routine skin checks 7. Fluids/Electrolytes/Nutrition: Routine I&O   Hyponatremia: 137 on 6/12 8. Chronic renal insufficiency. Baseline creatinine 1.80-2.00.   Creatinine 1.67 on 6/12 9. Systolic congestive heart failure. Continue Lasix 40 mg daily. The patient daily 10. Hypothyroidism. Synthroid. TSH 2.861 11. 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 Additionally, there is hematoma overlying the psoas muscles.RIghtL2,3 nerve roots primarily affected  -cont conservative mgt for now  -Hb 9.2 on 6/5 12. DM type 2  HbA1c 7.1 on 5/18  CBG (last 3)   Recent Labs  03/05/16 1623 03/05/16 2126 03/06/16 0656  GLUCAP 126* 116* 100*    Will cont to monitor and initiate medications if warranted 13. Obesity  Body mass index is 32.93 kg/(m^2).  Diet and exercise education  Will cont to encourage weight loss to increase endurance and promote overall health  LOS (Days) 13 A FACE TO FACE EVALUATION WAS PERFORMED  Ankit [name] 03/06/2016 9:15 AM

## 2016-03-07 ENCOUNTER — Inpatient Hospital Stay (HOSPITAL_COMMUNITY): Payer: BC Managed Care – PPO | Admitting: Physical Therapy

## 2016-03-07 ENCOUNTER — Inpatient Hospital Stay (HOSPITAL_COMMUNITY): Payer: BC Managed Care – PPO

## 2016-03-07 ENCOUNTER — Inpatient Hospital Stay (HOSPITAL_COMMUNITY): Payer: BC Managed Care – PPO | Admitting: Occupational Therapy

## 2016-03-07 DIAGNOSIS — M898X6 Other specified disorders of bone, lower leg: Secondary | ICD-10-CM | POA: Insufficient documentation

## 2016-03-07 DIAGNOSIS — M79661 Pain in right lower leg: Secondary | ICD-10-CM

## 2016-03-07 NOTE — Progress Notes (Signed)
[name]     PROGRESS NOTE  Subjective/Complaints:  Patient sitting up in his chair this morning. He notes he continues to have pain and stiffness in his right knee and ankle. He notes an area of localized tenderness along his mid tibia.  ROS: +Right leg pain. Denies CP, SOB, nausea, vomiting, diarrhea.  Objective: Vital Signs: Blood pressure 149/67, pulse 68, temperature 98.6 F (37 C), temperature source Oral, resp. rate 18, height 5\' 9"  (1.753 m), weight 102.6 kg (226 lb 3.1 oz), SpO2 100 %. No results found. No results for input(s): WBC, HGB, HCT, PLT in the last 72 hours.  Recent Labs  03/06/16 0530  NA 137  K 3.9  CL 107  GLUCOSE 110*  BUN 24*  CREATININE 1.67*  CALCIUM 9.1   CBG (last 3)   Recent Labs  03/05/16 2126 03/06/16 0656 03/06/16 1122  GLUCAP 116* 100* 109*    Wt Readings from Last 3 Encounters:  03/07/16 102.6 kg (226 lb 3.1 oz)  02/22/16 107.094 kg (236 lb 1.6 oz)  12/31/15 109.825 kg (242 lb 1.9 oz)    Physical Exam:  BP 149/67 mmHg  Pulse 68  Temp(Src) 98.6 F (37 C) (Oral)  Resp 18  Ht 5\' 9"  (1.753 m)  Wt 102.6 kg (226 lb 3.1 oz)  BMI 33.39 kg/m2  SpO2 100% Constitutional: He appears well-developed and well-nourished. NAD. HENT: Normocephalic and atraumatic.  Eyes: EOM and conjunctiva are normal.   Cardiovascular: Normal rate. Exam reveals no friction rub. No murmur heard. Respiratory: No respiratory distress. He has no wheezes. He has no rales.  Musculoskeletal:  Pain with provocative testing along the tibia  Neurological:  Diminished sensation to light touch bilateral anterior and lateral thighs, right greater than left (Improving) Motor: Bilateral upper extremities 5/5 proximal to distal Right lower extremity: Hip flexion 2+/5, knee extension 3-/5, ankle dorsi/plantar flexion 4/5 Left lower extremity: Hip flexion 4-/5 proximal to distal, knee extension 4+/5, ankle dorsi/plantar flexion 5/5   Skin: Pacemaker site C/D/I   Assessment/Plan: 1. Functional deficits secondary to cardiac arrest/CPR status post CRT-D which require 3+ hours per day of interdisciplinary therapy in a comprehensive inpatient rehab setting. Physiatrist is providing close team supervision and 24 hour management of active medical problems listed below. Physiatrist and rehab team continue to assess barriers to discharge/monitor patient progress toward functional and medical goals.  Function:  Bathing Bathing position   Position: Shower  Bathing parts Body parts bathed by patient: Right arm, Left arm, Chest, Abdomen, Buttocks, Front perineal area, Left upper leg, Right upper leg, Right lower leg, Left lower leg, Back Body parts bathed by helper: Right lower leg, Left lower leg, Back  Bathing assist Assist Level: Touching or steadying assistance(Pt > 75%)      Upper Body Dressing/Undressing Upper body dressing   What is the patient wearing?: Pull over shirt/dress     Pull over shirt/dress - Perfomed by patient: Thread/unthread right sleeve, Thread/unthread left sleeve, Put head through opening, Pull shirt over trunk Pull over shirt/dress - Perfomed by helper: Pull shirt over trunk        Upper body assist Assist Level: Set up   Set up : To obtain clothing/put away  Lower Body Dressing/Undressing Lower body dressing   What is the patient wearing?: Pants, [employer], Shoes Underwear - Performed by patient: Pull underwear up/down, Thread/unthread left underwear leg, Thread/unthread right underwear leg Underwear - Performed by helper: Pull underwear up/down, Thread/unthread left underwear leg,  Thread/unthread right underwear leg Pants- Performed by patient: Thread/unthread right pants leg, Thread/unthread left pants leg, Pull pants up/down Pants- Performed by helper: Thread/unthread left pants leg, Pull pants up/down   Non-skid slipper socks- Performed by helper: Don/doff right sock, Don/doff left sock      Shoes - Performed by patient: Don/doff right shoe, Don/doff left shoe, Fasten right, Fasten left Shoes - Performed by helper: Don/doff right shoe, Don/doff left shoe, Fasten right, Fasten left       TED Hose - Performed by helper: Don/doff left TED hose, Don/doff right TED hose  Lower body assist Assist for lower body dressing: Touching or steadying assistance (Pt > 75%)   Set up : To obtain clothing/put away, Don/doff TED stockings  Toileting Toileting Toileting activity did not occur: No continent bowel/bladder event Toileting steps completed by patient: Adjust clothing prior to toileting, Performs perineal hygiene, Adjust clothing after toileting Toileting steps completed by helper: Adjust clothing after toileting Toileting Assistive Devices: Grab bar or rail  Toileting assist Assist level: Supervision or verbal cues   Transfers Chair/bed transfer   Chair/bed transfer method: Ambulatory Chair/bed transfer assist level: Supervision or verbal cues Chair/bed transfer assistive device: Environmental consultant, Air cabin crew     Max distance: 12 Assist level: Supervision or verbal cues   Wheelchair   Type: Manual Max wheelchair distance: 176ft Assist Level: Supervision or verbal cues  Cognition Comprehension Comprehension assist level: Follows complex conversation/direction with no assist  Expression Expression assist level: Expresses complex ideas: With extra time/assistive device  Social Interaction Social Interaction assist level: Interacts appropriately 90% of the time - Needs monitoring or encouragement for participation or interaction.  Problem Solving Problem solving assist level: Solves complex 90% of the time/cues < 10% of the time  Memory Memory assist level: Recognizes or recalls 90% of the time/requires cueing < 10% of the time    Medical Problem List and Plan: 1. LE weakness secondary to cardiac arrest/CPR status post CRT-D 02/17/2016 and retroperitoneal  hematoma -continue CIR therapies 2. DVT Prophylaxis/Anticoagulation: Subcutaneous heparin. Monitor platelet counts and any signs of bleeding 3. Pain Management/chronic back pain: Oxycodone as needed  Voltaren gel for right knee - pain improved 4. Hypertension/ischemic cardiomyopathy. Amiodarone 400 mg daily, Coreg 25 mg daily, Lasix 40 mg daily,BIDIL 20-30 7.5 mg tablet 0.5 3 times a day.   Continues to be labile, pt asyptomatic Filed Vitals:   03/06/16 1717 03/07/16 0540  BP: 113/55 149/67  Pulse: 81 68  Temp:  98.6 F (37 C)  Resp:  18  5. Neuropsych: This patient is capable of making decisions on his own behalf. 6. Skin/Wound Care: Routine skin checks 7. Fluids/Electrolytes/Nutrition: Routine I&O   Hyponatremia: 137 on 6/12 8. Chronic renal insufficiency. Baseline creatinine 1.80-2.00.   Creatinine 1.67 on 6/12 9. Systolic congestive heart failure. Continue Lasix 40 mg daily. The patient daily 10. Hypothyroidism. Synthroid. TSH 2.861 11. 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 Additionally, there is hematoma overlying the psoas muscles.RIghtL2,3 nerve roots primarily affected  -cont conservative mgt for now  -Hb 9.2 on 6/5 12. DM type 2  HbA1c 7.1 on 5/18  CBG (last 3)   Recent Labs  03/05/16 2126 03/06/16 0656 03/06/16 1122  GLUCAP 116* 100* 109*    Will cont to monitor and initiate medications if warranted 13. Obesity  Body mass index is 33.39 kg/(m^2).  Diet and exercise education  Will cont to encourage weight loss to increase endurance and promote overall health 14. Mid-tibia pain  Will order an x-ray  LOS (Days) 14 A FACE TO FACE EVALUATION WAS PERFORMED  Ankit [name] 03/07/2016 9:16 AM

## 2016-03-07 NOTE — Progress Notes (Signed)
Occupational Therapy Session Note  Patient Details  Name: Eric [name] MRN: [id number] Date of Birth: [birth year]  Today's Date: 03/07/2016 OT Individual Time: 1440-1505 OT Individual Time Calculation (min): 25 min    Short Term Goals: Week 2:  OT Short Term Goal 1 (Week 2): LTG=STG  Skilled Therapeutic Interventions/Progress Updates:    Treatment session with focus on pt and caregiver (wife) education and improved standing endurance. Discussed carryover of skills to home such as safety awareness. Also discussed DME needs for home environment. Engaged pt in leisure activity at standing x2 w/o use of stable surface for balance to improve standing balance and endurance for increased success in completion of valued occupations. Pt able to maintain standing for 5-7 minutes w/o need for break. Continue to challenge standing balance and provide pt education for activity ideas and concerns post d/c.   Therapy Documentation Precautions:  Precautions Precautions: ICD/Pacemaker Precaution Comments: per Dr. [name] UE is WBAT as of 5/31 Restrictions Weight Bearing Restrictions: Yes LUE Weight Bearing: Weight bearing as tolerated General: General OT Amount of Missed Time: 20 Minutes PT Missed Treatment Reason:  (scheduling) Vital Signs: Therapy Vitals Temp: 98.9 F (37.2 C) Temp Source: Oral Pulse Rate: 85 Resp: 16 BP: (!) 97/51 mmHg Patient Position (if appropriate): Sitting Oxygen Therapy SpO2: 99 % O2 Device: Not Delivered Pain: Pain Assessment Pain Assessment: 0-10 Pain Score: 2  Pain Location:  (anterior tibia) Pain Orientation: Right Pain Intervention(s): Ambulation/increased activity  See Function Navigator for Current Functional Status.   Therapy/Group: Individual Therapy  Dierdre Searles 03/07/2016, 3:47 PM

## 2016-03-07 NOTE — Progress Notes (Signed)
Physical Therapy Session Note  Patient Details  Name: Eric [name] MRN: [id number] Date of Birth: 03/02/1951  Today's Date: 03/07/2016 PT Individual Time: 1045-1207 PT Individual Time Calculation (min): 82 min   Skilled Therapeutic Interventions/Progress Updates:    Pt received in w/c & agreeable to PT, noting 2/10 pain in R anterior tibia. Pt's wife present during session but asleep during initial portion of session, even with pt trying to wake her. Discussed DME needs & this PT recommends the following: RW, 3-in-1 BSC & pt plans to get a shower seat. Pt reports he would like a w/c and potentially plans to rent one; educated pt on his current ability to ambulate >150 ft without assistance and recommendation to continue to ambulate instead of relying on w/c & pt voiced understanding. Educated pt to ambulate in stores when out in community, to be aware of places to take seated rest breaks & to not rely on motorized carts. Gait training x 100 ft room>ortho gym with RW & supervision A. Pt completed car transfer from sedan simulated height with supervision, requiring BUE support to transfer BLE in/out of car. Pt reports he also has SUV but it has running boards; educated pt to use sedan car instead of SUV & pt & wife voiced understanding. Pt & wife educated on use of RW & how to store device. Pt practiced floor transfer with teach-back method to PT. Pt independently recalled all scenarios in which he should call EMS after a fall. Pt able to complete prone>quadruped with BUE on mat table with supervision but required mod A for quadruped to sitting on mat. Pt's wife educated on hand placement and method to assist pt with floor transfer & pt & wife able to return demonstrate with supervision from PT. Stair training completed & pt able to negotiate 6 steps (6") with BUE on R ascending rail. Pt & wife educated on curb negotiation with use of RW & pt able to return demonstrate with supervision A. Gait training x  100 ft back to room & pt left in w/c with all needs within reach & wife present.  Therapy Documentation Precautions:  Precautions Precautions: ICD/Pacemaker Precaution Comments: per Dr. [name] UE is WBAT as of 5/31 Restrictions Weight Bearing Restrictions: Yes LUE Weight Bearing: Weight bearing as tolerated  Pain: Pain Assessment Pain Assessment: 0-10 Pain Score: 2  Pain Location:  (anterior tibia) Pain Orientation: Right Pain Intervention(s): Ambulation/increased activity   See Function Navigator for Current Functional Status.   Therapy/Group: Individual Therapy  [name] 03/07/2016, 8:02 AM

## 2016-03-07 NOTE — Progress Notes (Signed)
Social Work Patient ID: Eric [name], male   DOB: 02-04-1951, 65 y.o.   MRN: [id number] Met with pt and wife who was here to learn his care in therapies. She plans to come back tomorrow for more. Discussed [employer] will not extend pt's stay to Monday due to feel he will be ready Friday as does the team of therapists. MD checking an x-ray on his knee, due to the pain he is having. Both decided want Home health follow up first and then transition to OP if needed. Wife reports just so much going on at this time, moving houses and getting audited She just needs a break. Work toward discharge Friday.

## 2016-03-07 NOTE — Progress Notes (Signed)
Occupational Therapy Session Note  Patient Details  Name: Jens Renard MRN: [id number] Date of Birth: Aug 19, 1951  Today's Date: 03/07/2016 OT Individual Time: [id number] OT Individual Time Calculation (min): 64 min    Short Term Goals: Week 2:  OT Short Term Goal 1 (Week 2): LTG=STG  Skilled Therapeutic Interventions/Progress Updates:    Treatment session with focus on pt and family education regarding functional mobility, activity tolerance, endurance, and recommended DME to increase safety and independence with self-care tasks.  Pt declined bathing at shower due to showering yesterday but wanting to wash at sink.  Increased challenge to incorporate longer bouts of standing with pt tolerating standing for 10-12 mins before requiring seated rest break.  Discussed active vs passive rest during bathing.  Pt's wife present throughout session although "nodding off".  Educated on use of reacher and sock aid to assist with LB dressing with pt able to return demonstration when donning and doffing socks and shorts.  Pt able to don underwear and shorts this session without use of reacher but did utilize while doffing Rt sock.  Discussed bathroom setup at home and recommendation for shower seat and most likely BSC to elevate height of commode at home.    Therapy Documentation Precautions:  Precautions Precautions: ICD/Pacemaker Precaution Comments: per Dr. [name] UE is WBAT as of 5/31 Restrictions Weight Bearing Restrictions: Yes LUE Weight Bearing: Weight bearing as tolerated Pain: Pain Assessment Pain Assessment: 0-10 Pain Score: 5  Pain Type: Acute pain Pain Location: Leg Pain Orientation: Left Pain Descriptors / Indicators: Discomfort Pain Intervention(s): Medication (See eMAR)  See Function Navigator for Current Functional Status.   Therapy/Group: Individual Therapy  [name] 03/07/2016, 10:48 AM

## 2016-03-08 ENCOUNTER — Inpatient Hospital Stay (HOSPITAL_COMMUNITY): Payer: BC Managed Care – PPO | Admitting: Occupational Therapy

## 2016-03-08 ENCOUNTER — Inpatient Hospital Stay (HOSPITAL_COMMUNITY): Payer: BC Managed Care – PPO | Admitting: Physical Therapy

## 2016-03-08 DIAGNOSIS — R52 Pain, unspecified: Secondary | ICD-10-CM

## 2016-03-08 LAB — BASIC METABOLIC PANEL
Anion gap: 7 (ref 5–15)
BUN: 25 mg/dL — AB (ref 6–20)
CO2: 24 mmol/L (ref 22–32)
CREATININE: 1.69 mg/dL — AB (ref 0.61–1.24)
Calcium: 9.3 mg/dL (ref 8.9–10.3)
Chloride: 107 mmol/L (ref 101–111)
GFR calc Af Amer: 48 mL/min — ABNORMAL LOW (ref >60)
GFR, EST NON AFRICAN AMERICAN: 41 mL/min — AB (ref >60)
Glucose, Bld: 95 mg/dL (ref 65–99)
Potassium: 3.9 mmol/L (ref 3.5–5.1)
SODIUM: 138 mmol/L (ref 135–145)

## 2016-03-08 LAB — CBC WITH DIFFERENTIAL/PLATELET
BASOS ABS: 0 10*3/uL (ref 0.0–0.1)
Basophils Relative: 0 %
EOS ABS: 0.1 10*3/uL (ref 0.0–0.7)
EOS PCT: 2 %
HCT: 31.1 % — ABNORMAL LOW (ref 39.0–52.0)
Hemoglobin: 9.9 g/dL — ABNORMAL LOW (ref 13.0–17.0)
LYMPHS ABS: 1.4 10*3/uL (ref 0.7–4.0)
Lymphocytes Relative: 32 %
MCH: 30.2 pg (ref 26.0–34.0)
MCHC: 31.8 g/dL (ref 30.0–36.0)
MCV: 94.8 fL (ref 78.0–100.0)
Monocytes Absolute: 0.9 10*3/uL (ref 0.1–1.0)
Monocytes Relative: 19 %
Neutro Abs: 2.1 10*3/uL (ref 1.7–7.7)
Neutrophils Relative %: 47 %
Platelets: 256 10*3/uL (ref 150–400)
RBC: 3.28 MIL/uL — AB (ref 4.22–5.81)
RDW: 15.1 % (ref 11.5–15.5)
WBC: 4.5 10*3/uL (ref 4.0–10.5)

## 2016-03-08 NOTE — Progress Notes (Signed)
Physical Therapy Session Note  Patient Details  Name: Eric [name] MRN: [id number] Date of Birth: Nov 13, 1950  Today's Date: 03/08/2016 PT Individual Time: [id number] and [id number]  PT Individual Time Calculation (min): 60 min & 59 min   Short Term Goals: Week 2:  PT Short Term Goal 1 (Week 2): STG = LTG due to anticipated d/c date.  Skilled Therapeutic Interventions/Progress Updates:    Treatment 1: Pt received in handoff from OT & pt agreeable to PT, denying c/o pain. Wife present during session for family training. Gait training x 100 ft room>ortho gym with RW & distant supervision. Pt set up for floor transfer but pt able to transfer sitting Edge of mat>supine on floor with supervision, then supine>sitting on edge of mat with assistance from wife. Pt continues to require assistance to push up to allow him to transfer to sitting on mat as pt with minimal BLE strength & relies heavily on arms. Pt & wife both voice increased comfort with activity & decline practicing a second time. Pt concerned with visiting his office following d/c & PT educated pt on need to use front entrance and avoid uneven terrain; both voiced understanding. Gait training off of unit & outside with RW & distant supervision. Educated pt on negotiating uneven concrete & stairs with R ascending rail while outside. Pt able to recognize when he needed to take rest breaks to prevent excessive fatigue. Back on unit pt able to negotiate curb with RW & distant supervision, independently recalling compensatory technique for negotiating step. At the end of the session pt returned to room & left with all needs within reach & wife present to supervise.   Treatment 2: Pt received in w/c & agreeable to PT, noting 2/10 pain in medial aspect R ankle. Gait training x 150 ft room>gym with RW & distant supervision. Pt inquiring about bariatric RW vs standard RW; educated pt on use of standard RW & provided pt with AD. Gait training x 200 ft with new  RW & distant supervision. Pt completed Berg Balance Test & scored 32/56. Educated pt on interpretation of score, risk of falling, and need for AD with mobility. Patient demonstrates increased fall risk as noted by score of 32/56 on Berg Balance Scale.  (<36= high risk for falls, close to 100%; 37-45 significant >80%; 46-51 moderate >50%; 52-55 lower >25%). Pt completed floor transfer, independently recalling all situations in which he should call EMS after falling. Pt required mod A from quadruped>sitting on edge of mat & noted great fatigue at end of session. Gait training x 150 ft back to room with RW & pt left in w/c with all needs within reach. Pt noted 6/10 burning feet & RLE pain; PT notified RN of need for pain medicine.    Therapy Documentation Precautions:  Precautions Precautions: ICD/Pacemaker Precaution Comments: per Dr. [name] UE is WBAT as of 5/31 Restrictions Weight Bearing Restrictions: Yes LUE Weight Bearing: Weight bearing as tolerated  Pain: Pain Assessment Pain Assessment: No/denies pain   See Function Navigator for Current Functional Status.   Therapy/Group: Individual Therapy  [name] 03/08/2016, 7:54 AM

## 2016-03-08 NOTE — Progress Notes (Signed)
Occupational Therapy Session Note  Patient Details  Name: Eric [name] MRN: [id number] Date of Birth: 1951/01/19  Today's Date: 03/08/2016 OT Individual Time: 0730-0830              60 minutes      Short Term Goals: Week 2:  OT Short Term Goal 1 (Week 2): LTG=STG  Skilled Therapeutic Interventions/Progress Updates:    Treatment session with focus on self care skills. Pt performed all showering tasks at level of supervision with improved ability to reach BLEs for cleaning and to retrieve dropped items from floor. Pt demonstrates good showering safety awareness as evidenced by appropriate use of grab bars for contact assist for standing showering tasks. Continue to provide pt education specific to energy conservation ideas in context of occupational performance and intervention for increasing standing tolerance and endurance.   Therapy Documentation Precautions:  Precautions Precautions: ICD/Pacemaker Precaution Comments: per Dr. [name] UE is WBAT as of 5/31 Restrictions Weight Bearing Restrictions: Yes LUE Weight Bearing: Weight bearing as tolerated General:   Vital Signs: Therapy Vitals Pulse Rate: 78 BP: 137/69 mmHg Pain: Pain Assessment Pain Assessment: No/denies pain  See Function Navigator for Current Functional Status.   Therapy/Group: Individual Therapy  Dierdre Searles 03/08/2016, 12:12 PM

## 2016-03-08 NOTE — Progress Notes (Addendum)
[facility] PHYSICAL MEDICINE & REHABILITATION     PROGRESS NOTE  Subjective/Complaints:  Patient seen working with OT this morning. He notes that his knee and ankle pain is stable/slightly improved.  ROS: +Right leg pain. Denies CP, SOB, nausea, vomiting, diarrhea.  Objective: Vital Signs: Blood pressure 137/69, pulse 78, temperature 97.8 F (36.6 C), temperature source Oral, resp. rate 18, height 5\' 9"  (1.753 m), weight 99.4 kg (219 lb 2.2 oz), SpO2 99 %. Dg Tibia/fibula Right  03/07/2016  CLINICAL DATA:  Anterior lower leg pain for 4 days with no known injury. Evaluate for stress fracture. EXAM: RIGHT TIBIA AND FIBULA - 2 VIEW COMPARISON:  None. FINDINGS: There is no evidence of acute or stress fracture. No malalignment. Soft tissues are unremarkable. IMPRESSION: Negative. Electronically Signed   By: Monte Fantasia M.D.   On: 03/07/2016 14:03    Recent Labs  03/08/16 0603  WBC 4.5  HGB 9.9*  HCT 31.1*  PLT 256    Recent Labs  03/06/16 0530 03/08/16 0603  NA 137 138  K 3.9 3.9  CL 107 107  GLUCOSE 110* 95  BUN 24* 25*  CREATININE 1.67* 1.69*  CALCIUM 9.1 9.3   CBG (last 3)   Recent Labs  03/05/16 2126 03/06/16 0656 03/06/16 1122  GLUCAP 116* 100* 109*    Wt Readings from Last 3 Encounters:  03/08/16 99.4 kg (219 lb 2.2 oz)  02/22/16 107.094 kg (236 lb 1.6 oz)  12/31/15 109.825 kg (242 lb 1.9 oz)    Physical Exam:  BP 137/69 mmHg  Pulse 78  Temp(Src) 97.8 F (36.6 C) (Oral)  Resp 18  Ht 5\' 9"  (1.753 m)  Wt 99.4 kg (219 lb 2.2 oz)  BMI 32.35 kg/m2  SpO2 99% Constitutional: He appears well-developed and well-nourished. NAD. HENT: Normocephalic and atraumatic.  Eyes: EOM and conjunctiva are normal.   Cardiovascular: Normal rate. Exam reveals no friction rub. No murmur heard. Respiratory: No respiratory distress. He has no wheezes. He has no rales.  Musculoskeletal:  Pain with provocative testing along the tibia, stable  Neurological:  Diminished  sensation to light touch bilateral anterior and lateral thighs, right greater than left (Improving) Motor: Bilateral upper extremities 5/5 proximal to distal Right lower extremity: Hip flexion 2+/5, knee extension 3-/5, ankle dorsi/plantar flexion 4/5 Left lower extremity: Hip flexion 4-/5 proximal to distal, knee extension 4+/5, ankle dorsi/plantar flexion 5/5  Skin: Pacemaker site C/D/I   Assessment/Plan: 1. 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 Physiatrist is providing close team supervision and 24 hour management of active medical problems listed below. Physiatrist and rehab team continue to assess barriers to discharge/monitor patient progress toward functional and medical goals.  Function:  Bathing Bathing position   Position: Wheelchair/chair at sink  Bathing parts Body parts bathed by patient: Right arm, Left arm, Chest, Abdomen, Buttocks, Front perineal area, Left upper leg, Right upper leg, Right lower leg, Left lower leg, Back Body parts bathed by helper: Right lower leg, Left lower leg, Back  Bathing assist Assist Level: Supervision or verbal cues      Upper Body Dressing/Undressing Upper body dressing   What is the patient wearing?: Pull over shirt/dress     Pull over shirt/dress - Perfomed by patient: Thread/unthread right sleeve, Thread/unthread left sleeve, Put head through opening, Pull shirt over trunk Pull over shirt/dress - Perfomed  by helper: Pull shirt over trunk        Upper body assist Assist Level: Set up   Set up : To obtain clothing/put away  Lower Body Dressing/Undressing Lower body dressing   What is the patient wearing?: Underwear, Pants, Shoes, [employer]  - Performed by patient: Pull underwear up/down, Thread/unthread left underwear leg,  Thread/unthread right underwear leg Underwear - Performed by helper: Pull underwear up/down, Thread/unthread left underwear leg, Thread/unthread right underwear leg Pants- Performed by patient: Thread/unthread right pants leg, Thread/unthread left pants leg, Pull pants up/down Pants- Performed by helper: Thread/unthread left pants leg, Pull pants up/down   Non-skid slipper socks- Performed by helper: Don/doff right sock, Don/doff left sock     Shoes - Performed by patient: Don/doff right shoe, Don/doff left shoe, Fasten right, Fasten left Shoes - Performed by helper: Don/doff right shoe, Don/doff left shoe, Fasten right, Fasten left       TED Hose - Performed by helper: Don/doff left TED hose, Don/doff right TED hose  Lower body assist Assist for lower body dressing: Supervision or verbal cues   Set up : To obtain clothing/put away, Don/doff TED stockings  Toileting Toileting Toileting activity did not occur: No continent bowel/bladder event Toileting steps completed by patient: Adjust clothing prior to toileting, Performs perineal hygiene, Adjust clothing after toileting Toileting steps completed by helper: Adjust clothing after toileting Toileting Assistive Devices: Grab bar or rail  Toileting assist Assist level: Set up/obtain supplies   Transfers Chair/bed transfer   Chair/bed transfer method: Ambulatory Chair/bed transfer assist level: Supervision or verbal cues Chair/bed transfer assistive device: Walker, Air cabin crew     Max distance: 100 ft Assist level: Supervision or verbal cues   Wheelchair   Type: Manual Max wheelchair distance: 145ft Assist Level: Supervision or verbal cues  Cognition Comprehension Comprehension assist level: Follows complex conversation/direction with no assist  Expression Expression assist level: Expresses complex ideas: With extra time/assistive device  Social Interaction Social Interaction assist level: Interacts  appropriately with others - No medications needed.  Problem Solving Problem solving assist level: Solves complex 90% of the time/cues < 10% of the time  Memory Memory assist level: Recognizes or recalls 90% of the time/requires cueing < 10% of the time    Medical Problem List and Plan: 1. LE weakness secondary to cardiac arrest/CPR status post CRT-D 02/17/2016 and retroperitoneal hematoma -continue CIR therapies 2. DVT Prophylaxis/Anticoagulation: Subcutaneous heparin. Monitor platelet counts and any signs of bleeding 3. Pain Management/chronic back pain: Oxycodone as needed  Voltaren gel for right knee and ankle 4. Hypertension/ischemic cardiomyopathy. Amiodarone 400 mg daily, Coreg 25 mg daily, Lasix 40 mg daily,BIDIL 20-30 7.5 mg tablet 0.5 3 times a day.   Continues to be labile, pt asyptomatic Filed Vitals:   03/08/16 0500 03/08/16 0821  BP: 140/66 137/69  Pulse: 72 78  Temp: 97.8 F (36.6 C)   Resp: 18   5. Neuropsych: This patient is capable of making decisions on his own behalf. 6. Skin/Wound Care: Routine skin checks 7. Fluids/Electrolytes/Nutrition: Routine I&O   Hyponatremia: 138 on 6/14 8. Chronic renal insufficiency. Baseline creatinine 1.80-2.00.   Creatinine 1.69 on 6/14 9. Systolic congestive heart failure. Continue Lasix 40 mg daily. The patient daily 10. Hypothyroidism. Synthroid. TSH 2.861 11. 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 Additionally, there is hematoma overlying the psoas muscles.RIghtL2,3 nerve roots primarily affected  -  cont conservative mgt for now  -Hb 9.9 on 6/14 12. DM type 2  HbA1c 7.1 on 5/18  CBG (last 3)   Recent Labs  03/05/16 2126 03/06/16 0656 03/06/16 1122  GLUCAP 116* 100* 109*    Will cont to monitor and initiate medications if warranted 13. Obesity  Body mass index is 32.35  kg/(m^2).  Diet and exercise education  Will cont to encourage weight loss to increase endurance and promote overall health 14. Mid-tibia pain  X-ray from 6/13 reviewed, normal  LOS (Days) 15 A FACE TO FACE EVALUATION WAS PERFORMED  Ankit [name] 03/08/2016 8:52 AM

## 2016-03-08 NOTE — Progress Notes (Signed)
Occupational Therapy Session Note  Patient Details  Name: Eric [name] MRN: [id number] Date of Birth: 22-Nov-1950  Today's Date: 03/08/2016 OT Individual Time: 1030-1100 OT Individual Time Calculation (min): 30 min    Short Term Goals: Week 2:  OT Short Term Goal 1 (Week 2): LTG=STG  Skilled Therapeutic Interventions/Progress Updates:    1:1 Focus on functional ambulation in room to bathroom and voided and performed toileting in standing at mod I level. Pt ambulated to ADL apartment and focus on functional mobility around apartment. Problem solved and discussed option for performing simple meal prep in the kitchen and different ways to transport items while still remaining safe with RW. Pt able to perform bed mobility in ADL regular bed at mod I level. Discussed pt will not qualify for the "heavy duty" RW he has been using. Ambulated back to room with regular walker and discussed if he prefers "heavier" walker pt will have to purchase per SW. Left in w/c in room to rest before lunch.   Therapy Documentation Precautions:  Precautions Precautions: ICD/Pacemaker Precaution Comments: per Dr. [name] UE is WBAT as of 5/31 Restrictions Weight Bearing Restrictions: Yes LUE Weight Bearing: Weight bearing as tolerated Pain:  no c/o pain in session   See Function Navigator for Current Functional Status.   Therapy/Group: Individual Therapy  Willeen Cass [facility] 03/08/2016, 2:50 PM

## 2016-03-08 NOTE — Patient Care Conference (Signed)
Inpatient RehabilitationTeam Conference and Plan of Care Update Date: 03/08/2016   Time: 2:10 PM    Patient Name: Eric [name]      Medical Record Number: [id number]  Date of Birth: 03-Dec-1950 Sex: Male         Room/Bed: 4M08C/4M08C-02 Payor Info: Payor: [place] / Plan: [place] PPO / Product Type: *No Product type* /    Admitting Diagnosis: Cardiac arrest, femoral neuropathy  Admit Date/Time:  02/22/2016  4:44 PM Admission Comments: No comment available   Primary Diagnosis:  Femoral nerve injury Principal Problem: Femoral nerve injury  Patient Active Problem List   Diagnosis Date Noted  . Right ankle pain   . Pain   . Tibial pain   . Acute idiopathic gout of right ankle   . Right knee pain   . Diabetes mellitus type 2 in obese ([place])   . Labile blood pressure   . Chronic renal insufficiency   . Acute blood loss anemia   . Diabetes mellitus type 2 in nonobese ([facility])   . Debility 02/22/2016  . 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 Weakness of both lower extremities   . Lower extremity weakness   . Pneumonia   . Acute lumbar back pain   . Non-traumatic rhabdomyolysis   . Retroperitoneal bleed   . Back pain   . Leg weakness, bilateral   . Paroxysmal atrial fibrillation ([place])   . Ventricular fibrillation ([place]) 02/09/2016  . Aortic aneurysm without rupture ([place]) 02/09/2016  . Aortic insufficiency 02/09/2016  . HCAP (healthcare-associated pneumonia)   . AKI (acute kidney injury) ([place])   . Cardiac arrest ([place]) 02/01/2016  . Acute on chronic systolic heart failure ([place])   . Acute encephalopathy   . CKD (chronic kidney disease)   . Thyroid activity decreased   . Acute hypoxemic respiratory failure ([place])   . Acute respiratory failure ([place])   . Encounter for central line placement   . Arrhythmia   . Altered mental status   . CKD (chronic kidney disease), stage IV ([place]) 08/06/2013    Class: Chronic  . Long term (current) use of  anticoagulants 08/06/2013    Class: Chronic  . Special screening for malignant neoplasms, colon 04/11/2013  . Colon cancer screening 03/04/2013  . Chronic anticoagulation 03/04/2013  . Atrial flutter ([place]) 03/18/2012    Class: Acute  . Chronic systolic heart failure ([place]) 02/13/2012    Class: Acute  . Hypertension, accelerated 02/13/2012  . Aortic valve regurgitation, acquired 02/13/2012  . Aortic root enlargement ([place]) 02/13/2012  . Hyperlipidemia 02/13/2012  . Left bundle branch block 02/13/2012  . Obesity (BMI 30-39.9) 02/13/2012    Expected Discharge Date: Expected Discharge Date: 03/10/16  Team Members Present: Physician leading conference: Dr. Delice Lesch Social Worker Present: Ovidio Kin, LCSW Nurse Present: Heather Roberts, RN PT Present: Barrie Folk, PT;Victoria Sabra Heck, PT OT Present: Willeen Cass, OT PPS Coordinator present : Daiva Nakayama, RN, CRRN     Current Status/Progress Goal Weekly Team Focus  Medical   LE weakness secondary to retroperitoneal hematoma  Improve gait, ambulation, pain  see above   Bowel/Bladder   contient of bowel and bladder. LBM 03/08/16  remain continent of bowel and bladder with mod I   Monitor B&B    Swallow/Nutrition/ Hydration             ADL's   Supervision/setup with ADLs and transfers with RW  overall supervision  activity tolerance, dynamic  standing balance, pt/family education   Mobility   supervision with transfers & ambulation with RW x 150 ft, mod A for floor transfers, continued BLE quad weakness  supervision for ambulation with LRAD, Min A stair training, supervision standing balance  pt & family education, floor transfers, BLE strengthening, safety awareness, stair training   Communication             Safety/Cognition/ Behavioral Observations            Pain   occasional pain to R knee. Percocet q4 hours as needed and voltaren gel 4x day   <4   Assess pain prn and tx accordingly    Skin   abrasion to upper left  abdomen. Foam dx/ neosprorin daily. ICD insion to Left chest. No s/s of infection   no new skin breakdown and infection  assess skin q shift and prn       *See Care Plan and progress notes for long and short-term goals.  Barriers to Discharge: Pain, safety, mobility    Possible Resolutions to Barriers:  Cont therapies, optimze pain management    Discharge Planning/Teaching Needs:  Wife has been in for family education, still working on someone to be there with him.      Team Discussion:  Progressing toward mod/i level in room will make tomorrow to build confidence for home. R-knee pain x-ray was negative. Still WBAT on leg. Self limits himself in therapies. Wife has been in yesterday and today for family education.  Revisions to Treatment Plan:  DC 6/16 may upgrade ambulation goal to mod/i short distances   Continued Need for Acute Rehabilitation Level of Care: The patient requires daily medical management by a physician with specialized training in physical medicine and rehabilitation for the following conditions: Daily direction of a multidisciplinary physical rehabilitation program to ensure safe treatment while eliciting the highest outcome that is of practical value to the patient.: Yes Daily medical management of patient stability for increased activity during participation in an intensive rehabilitation regime.: Yes Daily analysis of laboratory values and/or radiology reports with any subsequent need for medication adjustment of medical intervention for : Cardiac problems;Neurological problems;Diabetes problems  [name] 03/09/2016, 3:35 PM

## 2016-03-08 NOTE — Progress Notes (Signed)
Patient slept well throughout the night after being medicated for right lower extremity pain. Patient able to ambulate in room with walker and supervision assist. Continent of urine. Tolerates po medications without difficulty. No distress noted.

## 2016-03-09 ENCOUNTER — Encounter (HOSPITAL_COMMUNITY): Payer: BC Managed Care – PPO

## 2016-03-09 ENCOUNTER — Inpatient Hospital Stay (HOSPITAL_COMMUNITY): Payer: BC Managed Care – PPO | Admitting: Occupational Therapy

## 2016-03-09 ENCOUNTER — Inpatient Hospital Stay (HOSPITAL_COMMUNITY): Payer: BC Managed Care – PPO

## 2016-03-09 ENCOUNTER — Inpatient Hospital Stay (HOSPITAL_COMMUNITY): Payer: BC Managed Care – PPO | Admitting: Physical Therapy

## 2016-03-09 DIAGNOSIS — M25571 Pain in right ankle and joints of right foot: Secondary | ICD-10-CM

## 2016-03-09 MED ORDER — POLYETHYLENE GLYCOL 3350 17 G PO PACK: 17.0000 g | PACK | Freq: Every day | ORAL | Status: DC

## 2016-03-09 MED ORDER — LEVOTHYROXINE SODIUM 75 MCG PO TABS: 75.0000 ug | ORAL_TABLET | Freq: Every day | ORAL | Status: DC

## 2016-03-09 MED ORDER — AMIODARONE HCL 400 MG PO TABS: 400.0000 mg | ORAL_TABLET | Freq: Every day | ORAL | Status: DC

## 2016-03-09 MED ORDER — DICLOFENAC SODIUM 1 % TD GEL: 2.0000 g | Freq: Four times a day (QID) | TRANSDERMAL | Status: DC

## 2016-03-09 MED ORDER — OXYCODONE-ACETAMINOPHEN 5-325 MG PO TABS: 1.0000 | ORAL_TABLET | Freq: Four times a day (QID) | ORAL | Status: DC | PRN

## 2016-03-09 MED ORDER — FUROSEMIDE 40 MG PO TABS: 40.0000 mg | ORAL_TABLET | Freq: Every day | ORAL | Status: DC

## 2016-03-09 MED ORDER — MAGNESIUM OXIDE 400 MG PO TABS: 400.0000 mg | ORAL_TABLET | Freq: Every day | ORAL | Status: DC

## 2016-03-09 MED ORDER — CARVEDILOL 25 MG PO TABS: 25.0000 mg | ORAL_TABLET | Freq: Two times a day (BID) | ORAL | Status: DC

## 2016-03-09 MED ORDER — ISOSORB DINITRATE-HYDRALAZINE 20-37.5 MG PO TABS: 0.5000 | ORAL_TABLET | Freq: Three times a day (TID) | ORAL | Status: DC

## 2016-03-09 NOTE — Discharge Summary (Signed)
Charge summary job [phone]

## 2016-03-09 NOTE — Progress Notes (Signed)
Occupational Therapy Discharge Summary  Patient Details  Name: Eric [name] MRN: [id number] Date of Birth: [birth year]  Patient has met 10 of 10 long term goals due to improved activity tolerance, improved balance, postural control, ability to compensate for deficits, improved attention and improved awareness.  Patient to discharge at overall Supervision level - Mod I with toileting and toilet transfers.  Patient's care partner is independent to provide the necessary physical assistance at discharge.    Reasons goals not met: N/A  Recommendation:  Patient will benefit from ongoing skilled OT services in home health setting to continue to advance functional skills in the area of BADL, iADL and Reduce care partner burden.  Equipment: shower chair and elongated BSC  Reasons for discharge: treatment goals met and discharge from hospital  Patient/family agrees with progress made and goals achieved: Yes  OT Discharge Precautions/Restrictions  Precautions Precautions: ICD/Pacemaker Restrictions Weight Bearing Restrictions: Yes LUE Weight Bearing: Weight bearing as tolerated General   Vital Signs Therapy Vitals Temp: 98.1 F (36.7 C) Temp Source: Oral Pulse Rate: 78 Resp: 18 BP: 116/60 mmHg Patient Position (if appropriate): Sitting Oxygen Therapy SpO2: 100 % O2 Device: Not Delivered Pain Pain Assessment Pain Assessment: 0-10 Pain Score: 2  Pain Type: Acute pain Pain Location: Knee Pain Orientation: Right Pain Descriptors / Indicators: Aching Pain Intervention(s): RN made aware ADL  See Function Navigator Vision/Perception  Vision- History Baseline Vision/History: Wears glasses Wears Glasses: Reading only Patient Visual Report: No change from baseline Vision- Assessment Vision Assessment?: No apparent visual deficits Perception Comments: WFL  Cognition Overall Cognitive Status: Within Functional Limits for tasks assessed Arousal/Alertness:  Awake/alert Orientation Level: Oriented X4 Memory: Appears intact Awareness: Appears intact Problem Solving: Appears intact Safety/Judgment: Appears intact Sensation Sensation Light Touch: Impaired Detail Light Touch Impaired Details: Impaired RLE Stereognosis: Not tested Hot/Cold: Not tested Coordination Gross Motor Movements are Fluid and Coordinated: No Fine Motor Movements are Fluid and Coordinated: No Coordination and Movement Description: R>L LLE weakness.  Motor    Mobility  Bed Mobility Bed Mobility: Rolling Right;Rolling Left;Supine to Sit;Sit to Supine Rolling Right: 6: Modified independent (Device/Increase time) Rolling Left: 6: Modified independent (Device/Increase time) Supine to Sit: 6: Modified independent (Device/Increase time) Sit to Supine: 6: Modified independent (Device/Increase time) Transfers Sit to Stand: 5: Supervision  Trunk/Postural Assessment  Cervical Assessment Cervical Assessment: Within Functional Limits Thoracic Assessment Thoracic Assessment: Within Functional Limits Lumbar Assessment Lumbar Assessment: Exceptions to [facility] Postural Control Postural Control: Within Functional Limits  Balance Balance Balance Assessed: Yes Static Sitting Balance Static Sitting - Level of Assistance: 6: Modified independent (Device/Increase time) Dynamic Sitting Balance Dynamic Sitting - Level of Assistance: 6: Modified independent (Device/Increase time) Static Standing Balance Static Standing - Level of Assistance: 5: Stand by assistance Extremity/Trunk Assessment RUE Assessment RUE Assessment: Within Functional Limits (grossly 4+/5 ) LUE Assessment LUE Assessment: Within Functional Limits (grossly 4/5 overall)   See Function Navigator for Current Functional Status.  [name] 03/09/2016, 3:21 PM

## 2016-03-09 NOTE — Progress Notes (Signed)
Occupational Therapy Session Note  Patient Details  Name: Eric [name] MRN: [id number] Date of Birth: [birth year]  Today's Date: 03/09/2016 OT Individual Time: 1100-1130 OT Individual Time Calculation (min): 30 min    Short Term Goals: Week 1:  OT Short Term Goal 1 (Week 1): Pt will perform toilet transfer with mod A in order to decrease level of assist with functional transfers. OT Short Term Goal 1 - Progress (Week 1): Met OT Short Term Goal 2 (Week 1): Pt will perform shower transfer with mod A in order to decrease level of assist with functional transfers.  OT Short Term Goal 2 - Progress (Week 1): Met OT Short Term Goal 3 (Week 1): Pt will perform LB dressing with mod A in order to increase I with functional task.  OT Short Term Goal 3 - Progress (Week 1): Met OT Short Term Goal 4 (Week 1): Pt will perform toileting with mod A balance or less in order to complete clothing management safely.  OT Short Term Goal 4 - Progress (Week 1): Met Week 2:  OT Short Term Goal 1 (Week 2): LTG=STG  Skilled Therapeutic Interventions/Progress Updates:    Pt seen for skilled OT to facilitate dynamic standing balance and tolerance. Pt worked on grooming and shaving standing at the sink independently demonstrating good balance and tolerance of standing at least 12-15 minutes. He then engaged in LE standing balance exercises using RW for support as he completed a set of AROM for hip flex, knee ext, and hip ext.  Pt also worked on [employer].  Recommended pt use these exercises at home for a few minutes at a time throughout the day. Pt in room with all needs met.  Therapy Documentation Precautions:  Precautions Precautions: ICD/Pacemaker Precaution Comments: per Dr. [name] UE is WBAT as of 5/31 Restrictions Weight Bearing Restrictions: Yes LUE Weight Bearing: Weight bearing as tolerated    Vital Signs: Therapy Vitals Temp: 97.9 F (36.6 C) Temp Source: Oral Pulse Rate: 63 Resp: 18 BP:  137/60 mmHg Patient Position (if appropriate): Lying Oxygen Therapy SpO2: 100 % O2 Device: Not Delivered Pain: Pain Assessment Pain Assessment: No/denies pain ADL:   See Function Navigator for Current Functional Status.   Therapy/Group: Individual Therapy  Harutyun Monteverde 03/09/2016, 8:26 AM

## 2016-03-09 NOTE — Progress Notes (Signed)
Occupational Therapy Session Note  Patient Details  Name: Eric [name] MRN: [id number] Date of Birth: 01-08-1951  Today's Date: 03/09/2016 OT Individual Time: [id number] and [id number] OT Individual Time Calculation (min): 60 min and 63 minutes     Short Term Goals: Week 2:  OT Short Term Goal 1 (Week 2): LTG=STG  Skilled Therapeutic Interventions/Progress Updates:    1 of 2) Treatment session with focus on self care skills. Pt completed showering at supervision with clinician providing set up for clothing. Pt demonstrates good safety awareness during ambulation, transfers, and problem solving. Pt competent in providing verbal cues for set up assist and able to verbalize transfer procedures to caregivers. Follow up with IADL retraining.  2 of 2) Treatment session with focus on IADL completion using RW. Pt demonstrated functional ambulation and environmental navigation, dynamic standing, and safety awareness. Pt completed BUE task transferring items from washer to dryer followed by folding items and placing them in low drawer to simulate IADL of laundry to improve successful completion of activity and problem solve with clinician assist. Pt demonstrates good problem solving with attention to safety awareness indicating good carryover from previous sessions.   Therapy Documentation Precautions:  Precautions Precautions: ICD/Pacemaker Precaution Comments: per Dr. [name] UE is WBAT as of 5/31 Restrictions Weight Bearing Restrictions: Yes LUE Weight Bearing: Weight bearing as tolerated General:   Vital Signs:   Pain: Pain Assessment Pain Assessment: 0-10 Pain Score: 1  Pain Type: Acute pain Pain Location: Knee Pain Orientation: Right Pain Descriptors / Indicators: Aching Patients Stated Pain Goal: 0 Pain Intervention(s): Medication (See eMAR)  See Function Navigator for Current Functional Status.   Therapy/Group: Individual Therapy  Dierdre Searles 03/09/2016, 12:23 PM

## 2016-03-09 NOTE — Discharge Summary (Signed)
NAME:  Eric [name], Eric [name] NO.:  [id number]  MEDICAL RECORD NO.:  [id number]  LOCATION:  4M08C                        FACILITY:  [facility]  PHYSICIAN:  Delice Lesch, MD        DATE OF BIRTH:  1950/11/18  DATE OF ADMISSION:  02/22/2016 DATE OF DISCHARGE:  03/10/2016                              DISCHARGE SUMMARY   DISCHARGE DIAGNOSES: 1. Debilitation secondary to cardiac arrest, retroperitoneal hematoma. 2. Subcutaneous heparin for DVT prophylaxis. 3. Pain management. 4. Hypertension with ischemic cardiomyopathy. 5. Chronic renal insufficiency. 6. Systolic congestive heart failure. 7. Hypothyroidism. 8. 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 9. Type 2 diabetes mellitus. 10.Constipation, resolved.  HISTORY OF PRESENT ILLNESS:  This is a 65 year old right-handed male with history of hypertension, chronic renal insufficiency with creatinine [id number], systolic congestive heart failure, atrial fibrillation, on Eliquis.  He lives with his wife, independent prior to admission, works full-time as an Chief Strategy Officer.  He presented on Jan 31, 2016 with cardiac arrest while at [facility].  Wife initiated CPR.  EMS arrived within 5-10 minutes, received two fibrillations.  Troponin 0.11. Lactic acid 1.2.  The patient was intubated for airway protection, extubated on Feb 06, 2016.  Echocardiogram with ejection fraction of 35%, diffuse hypokinesis with grade 2 diastolic dysfunction.  Initially, was placed on intravenous amiodarone, discontinued due to bradycardia, later resumed.  TEE completed with moderate to severe global reduction in LV function, no evidence of vegetation.  Systolic function moderately to severely reduced.  Renal Services consulted for creatinine 2.87 to 3.15.  Renal ultrasound without obstruction, steadily improved with gentle IV fluids.  Latest creatinine 1.53.  He underwent cardiac catheterization after renal function stabilized  on Feb 17, 2016 per Dr. Cristopher Peru.  He was maintained on subcutaneous heparin for DVT prophylaxis.  Acute on chronic anemia 9.4, monitored.  The patient with noted bilateral lower extremity weakness.  MRI and CT lumbar spine, abdomen, pelvis revealed a retroperitoneal hematoma.  It was felt this weakness was due to a femoral nerve compression although imaging also noted for hemorrhage around both psoas and iliacus muscles.  The patient was admitted for a comprehensive rehab program.  PAST MEDICAL HISTORY:  See discharge diagnoses.  SOCIAL HISTORY:  He lives with his wife, independent prior to admission, works as an Chief Strategy Officer.  Functional status upon admission to rehab services was max assist, sit to stand; moderate assist, supine to sit; mod to max assist, activities of daily living.  PHYSICAL EXAMINATION:  VITAL SIGNS:  Blood pressure 129/71, pulse 87, temperature 99, respirations 18. GENERAL:  This was an alert male, well developed, well nourished, cognitively intact with normal insight and awareness. LUNGS:  Clear to auscultation without wheeze. CARDIAC:  Regular rate and rhythm.  No murmur. ABDOMEN:  Soft, nontender.  Good bowel sounds.  REHABILITATION HOSPITAL COURSE:  The patient was admitted to inpatient rehab services with therapies initiated on a 3-hour daily basis, consisting of physical therapy, occupational therapy, and rehabilitation nursing.  The following issues were addressed during the patient's rehabilitation stay.  Pertaining to [name]'s cardiac arrest, CPR initiated status post CRT-D Feb 17, 2016.  He would follow up Cardiology  Services.  No chest pain or shortness of breath.  Subcutaneous heparin for DVT prophylaxis.  No bleeding episodes.  Pain management with chronic back pain, oxycodone as needed as well as Voltaren gel to right knee and ankle.  Blood pressure and heart rate controlled.  He continued on amiodarone, Coreg, Lasix 40 mg daily as  well as BiDil.  He would follow up Cardiology Services.  Chronic renal insufficiency, baseline creatinine 1.80-2.0, creatinine stabilized to 1.69.  He exhibited no other signs of fluid overload.  Noted bilateral retroperitoneal hemorrhage with femoral nerve involvement, likely affecting the iliacus muscle leading to sensory loss along the anterior lateral thigh. Conservative care provided.  Acute on chronic anemia.  Hemoglobin 9.9. Diabetes mellitus, type 2.  Hemoglobin A1c 7.1.  Diet controlled, monitored.  He would follow up with his primary physician.  The patient received weekly collaborative interdisciplinary team conferences to discuss estimated length of stay, family teaching, any barriers to discharge.  Gait training, 100 feet in the room, orthopedic gym, rolling walker, needing only distance supervision.  The patient is set up for floor transfers, was able to transfer sitting edge of mat, supine on floor with supervision, and supine sitting on the edge of the mat with assistance from his wife.  Educated the patient on negotiating uneven concrete stairs with distance supervision using assistive device.  He was able to increase his distance in ambulation, as endurance improved up to 200 feet again with rolling walker.  It was discussed with the patient and wife not returning to work at this time until able to tolerate increased endurance.  He could gather his belongings for activities of daily living and homemaking.  Full family teaching was completed and plan discharge to home.  DISCHARGE MEDICATIONS:  Included amiodarone 400 mg p.o. daily, Coreg 25 mg p.o. b.i.d., Voltaren gel four times daily to affected area, Colace 100 mg p.o. b.i.d., Lasix 40 mg p.o. daily, BiDil 20-37.5 mg half tablet t.i.d., Synthroid 75 mcg p.o. daily, oxycodone one tablet every 6 hours as needed pain, MiraLAX daily and hold for loose stools.  DIET:  2000 calorie ADA diet.  INSTRUCTIONS:  The patient  would follow up with Dr. Delice Lesch, Outpatient Rehab Service Office as needed for plan EMG; Dr. Cristopher Peru, call for appointment; Dr. Theda Belfast. [facility], Medical Management.     Lauraine Rinne, P.A.   ______________________________ Delice Lesch, MD    DA/MEDQ  D:  03/09/2016  T:  03/09/2016  Job:  [id number]  cc:   Champ Mungo. Lovena Le, MD Theda Belfast. [facility], M.D.

## 2016-03-09 NOTE — Progress Notes (Signed)
Physical Therapy Discharge Summary  Patient Details  Name: Eric [name] MRN: [id number] Date of Birth: 02-02-1951  Today's Date: 03/09/2016 PT Individual Time: 0903-1004 PT Individual Time Calculation (min): 61 min    Patient has met 11 of 11 long term goals due to improved activity tolerance, improved balance, improved postural control, increased strength, increased range of motion and ability to compensate for deficits.  Patient to discharge at an ambulatory level Supervision.   Patient's care partner is independent to provide the necessary physical assistance at discharge.    Recommendation:  Patient will benefit from ongoing skilled PT services in home health setting to continue to advance safe functional mobility, address ongoing impairments in Strength, balance, endurance, gait, and minimize fall risk.  Equipment: Rolling walker  Reasons for discharge: treatment goals met and discharge from hospital  Patient/family agrees with progress made and goals achieved: Yes   PT treatment:  Patient instructed in grad day activities, see below for results. Patient also instructed in car transfer with supervision A. Gait training performed in gift shop for simulated community environment with supervision A from PT for 169f.   [facility] and all other needs met.   PT Discharge Precautions/Restrictions Restrictions Weight Bearing Restrictions: Yes LUE Weight Bearing: Weight bearing as tolerated Vital Signs   Pain Pain Assessment Pain Assessment: 0-10 Pain Score: 1  Pain Type: Acute pain Pain Location: Knee Pain Orientation: Right Pain Descriptors / Indicators: Aching Patients Stated Pain Goal: 0 Pain Intervention(s): Medication (See eMAR) Vision/Perception     Cognition Orientation Level: Oriented X4 Memory: Appears intact Awareness: Appears intact Problem Solving: Appears intact Safety/Judgment: Appears  intact Sensation Sensation Light Touch: Impaired Detail Light Touch Impaired Details: Impaired RLE Stereognosis: Not tested Hot/Cold: Not tested Additional Comments: Impaired Light touch appreciation on the RLE in L2-L4 dermatomes.  Coordination Gross Motor Movements are Fluid and Coordinated: No Fine Motor Movements are Fluid and Coordinated: No Coordination and Movement Description: R>L LLE weakness.  Motor  Motor Motor: Other (comment);Paraplegia Motor - Discharge Observations: Patient demonstrates muscle weakness in the  RLE secondary to femoral nerve damage. mild weakness in the L LE with hip flexion.   Mobility Bed Mobility Bed Mobility: Rolling Right;Rolling Left;Supine to Sit;Sit to Supine Rolling Right: 6: Modified independent (Device/Increase time) Rolling Left: 6: Modified independent (Device/Increase time) Supine to Sit: 6: Modified independent (Device/Increase time) Sit to Supine: 6: Modified independent (Device/Increase time) Transfers Transfers: Yes Sit to Stand: 5: Supervision Stand Pivot Transfers: 5: Supervision Locomotion  Ambulation Ambulation: Yes Ambulation/Gait Assistance: 5: Supervision Ambulation Distance (Feet): 200 Feet Assistive device: Rolling walker Ambulation/Gait Assistance Details: Verbal cues for precautions/safety Gait Gait: Yes Gait Pattern: Impaired Gait Pattern: Step-to pattern;Wide base of support Gait velocity: decreased Stairs / Additional Locomotion Stairs: Yes Stairs Assistance: 5: Supervision Stairs Assistance Details: Verbal cues for technique Stair Management Technique: One rail Right Number of Stairs: 12 Height of Stairs: 4 Wheelchair Mobility Wheelchair Mobility: Yes Wheelchair Assistance: 6: Modified independent (Device/Increase time) WEnvironmental health practitioner Both upper extremities Wheelchair Parts Management: Independent Distance: 150  Trunk/Postural Assessment  Cervical Assessment Cervical Assessment: Within  Functional Limits Thoracic Assessment Thoracic Assessment: Within Functional Limits Lumbar Assessment Lumbar Assessment: Exceptions to [facility] Control Postural Control: Within Functional Limits  Balance Balance Balance Assessed: Yes Static Sitting Balance Static Sitting - Level of Assistance: 6: Modified independent (Device/Increase time) Dynamic Sitting Balance Dynamic Sitting - Level of Assistance: 6: Modified independent (Device/Increase time) Static Standing Balance  Static Standing - Level of Assistance: 5: Stand by assistance Extremity Assessment      RLE Assessment RLE Assessment: Exceptions to [facility] RLE AROM (degrees) RLE Overall AROM Comments: unable to perform knee extension against gravity.  RLE Strength RLE Overall Strength: Deficits Right Hip Flexion: 2+/5 Right Hip Extension: 4/5 Right Hip ABduction: 4+/5 Right Hip ADduction: 4+/5 Right Knee Flexion: 4+/5 Right Knee Extension: 2/5 Right Ankle Dorsiflexion: 4+/5 Right Ankle Plantar Flexion: 4+/5 LLE Assessment LLE Assessment: Exceptions to WFL LLE AROM (degrees) LLE Overall AROM Comments: decrease hip flexion by 50% LLE Strength Left Hip Flexion: 4/5 Left Hip Extension: 4/5 Left Hip ABduction: 4+/5 Left Hip ADduction: 4-/5 Left Knee Flexion: 4+/5 Left Knee Extension: 4+/5 Left Ankle Dorsiflexion: 4+/5 Left Ankle Plantar Flexion: 4+/5   See Function Navigator for Current Functional Status.  [name] 03/09/2016, 12:50 PM

## 2016-03-09 NOTE — Progress Notes (Signed)
Social Work Patient ID: Eric [name], male   DOB: 04/29/1951, 65 y.o.   MRN: [id number] Met with pt to discuss transport chair and the therapist do not recommend one for him due to wants him to ambulate and not use a transport chair or wheelchair. He is aware he can get one on his own. He has been made mod/i in his room and aware team conference results. He feels ready to go home tomorrow it is the moving homes  And his eye practice. Wife has been educated and feels comfortable with his care needs. Work toward discharge tomorrow.

## 2016-03-09 NOTE — Progress Notes (Signed)
PHYSICAL MEDICINE & REHABILITATION     PROGRESS NOTE  Subjective/Complaints:  Patient seen sitting up at the edge of his bed. He is putting his clothes on. He notes his right knee and right ankle pain are present, but have slightly improved. She notes it is stiff initially in the morning but improves after short period of time.  ROS: +Right leg pain. Denies CP, SOB, nausea, vomiting, diarrhea.  Objective: Vital Signs: Blood pressure 137/60, pulse 63, temperature 97.9 F (36.6 C), temperature source Oral, resp. rate 18, height 5\' 9"  (1.753 m), weight 101.9 kg (224 lb 10.4 oz), SpO2 100 %. Dg Tibia/fibula Right  03/07/2016  CLINICAL DATA:  Anterior lower leg pain for 4 days with no known injury. Evaluate for stress fracture. EXAM: RIGHT TIBIA AND FIBULA - 2 VIEW COMPARISON:  None. FINDINGS: There is no evidence of acute or stress fracture. No malalignment. Soft tissues are unremarkable. IMPRESSION: Negative. Electronically Signed   By: Monte Fantasia M.D.   On: 03/07/2016 14:03    Recent Labs  03/08/16 0603  WBC 4.5  HGB 9.9*  HCT 31.1*  PLT 256    Recent Labs  03/08/16 0603  NA 138  K 3.9  CL 107  GLUCOSE 95  BUN 25*  CREATININE 1.69*  CALCIUM 9.3   CBG (last 3)   Recent Labs  03/06/16 1122  GLUCAP 109*    Wt Readings from Last 3 Encounters:  03/09/16 101.9 kg (224 lb 10.4 oz)  02/22/16 107.094 kg (236 lb 1.6 oz)  12/31/15 109.825 kg (242 lb 1.9 oz)    Physical Exam:  BP 137/60 mmHg  Pulse 63  Temp(Src) 97.9 F (36.6 C) (Oral)  Resp 18  Ht 5\' 9"  (1.753 m)  Wt 101.9 kg (224 lb 10.4 oz)  BMI 33.16 kg/m2  SpO2 100% Constitutional: He appears well-developed and well-nourished. NAD. HENT: Normocephalic and atraumatic.  Eyes: EOM and conjunctiva are normal.   Cardiovascular: Normal rate. Exam reveals no friction rub. No murmur heard. Respiratory: No respiratory distress. He has no wheezes. He has no rales.  Musculoskeletal:  Pain with  provocative testing along the tibia, stable  Neurological:  Diminished sensation to light touch bilateral anterior and lateral thighs, right greater than left (Improving) Motor: Bilateral upper extremities 5/5 proximal to distal Right lower extremity: Hip flexion 2+/5, knee extension 3-/5, ankle dorsi/plantar flexion 4/5 (All improving) Left lower extremity: Hip flexion 4/5 proximal to distal, knee extension 4+/5, ankle dorsi/plantar flexion 5/5  Skin: Pacemaker site C/D/I   Assessment/Plan: 1. 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 Physiatrist is providing close team supervision and 24 hour management of active medical problems listed below. Physiatrist and rehab team continue to assess barriers to discharge/monitor patient progress toward functional and medical goals.  Function:  Bathing Bathing position   Position: Shower  Bathing parts Body parts bathed by patient: Right arm, Left arm, Chest, Abdomen, Front perineal area, Buttocks, Right upper leg, Back, Left lower leg, Right lower leg, Left upper leg Body parts bathed by helper: Right lower leg, Left lower leg, Back  Bathing assist Assist Level: Supervision or verbal cues      Upper Body Dressing/Undressing Upper body dressing   What is the patient wearing?: Pull over shirt/dress     Pull over shirt/dress - Perfomed by patient: Thread/unthread right sleeve, Thread/unthread left  sleeve, Put head through opening, Pull shirt over trunk Pull over shirt/dress - Perfomed by helper: Pull shirt over trunk        Upper body assist Assist Level: Set up   Set up : To obtain clothing/put away  Lower Body Dressing/Undressing Lower body dressing   What is the patient wearing?: Underwear, Pants, Shoes, [employer]  - Performed by patient:  Thread/unthread right underwear leg, Thread/unthread left underwear leg, Pull underwear up/down Underwear - Performed by helper: Pull underwear up/down, Thread/unthread left underwear leg, Thread/unthread right underwear leg Pants- Performed by patient: Thread/unthread right pants leg, Thread/unthread left pants leg, Pull pants up/down Pants- Performed by helper: Thread/unthread left pants leg, Pull pants up/down   Non-skid slipper socks- Performed by helper: Don/doff right sock, Don/doff left sock     Shoes - Performed by patient: Don/doff right shoe, Don/doff left shoe, Fasten right, Fasten left Shoes - Performed by helper: Don/doff right shoe, Don/doff left shoe, Fasten right, Fasten left       TED Hose - Performed by helper: Don/doff right TED hose, Don/doff left TED hose  Lower body assist Assist for lower body dressing: Supervision or verbal cues   Set up : To obtain clothing/put away  Toileting Toileting Toileting activity did not occur: No continent bowel/bladder event Toileting steps completed by patient: Adjust clothing prior to toileting, Performs perineal hygiene, Adjust clothing after toileting Toileting steps completed by helper: Adjust clothing after toileting Toileting Assistive Devices: Grab bar or rail  Toileting assist Assist level: No help/no cues   Transfers Chair/bed transfer   Chair/bed transfer method: Ambulatory Chair/bed transfer assist level: Supervision or verbal cues Chair/bed transfer assistive device: Walker, Air cabin crew     Max distance: >150 ft Assist level: Supervision or verbal cues   Wheelchair   Type: Manual Max wheelchair distance: 131ft Assist Level: Supervision or verbal cues  Cognition Comprehension Comprehension assist level: Follows complex conversation/direction with no assist  Expression Expression assist level: Expresses complex ideas: With extra time/assistive device  Social Interaction Social  Interaction assist level: Interacts appropriately with others - No medications needed.  Problem Solving Problem solving assist level: Solves complex 90% of the time/cues < 10% of the time  Memory Memory assist level: Recognizes or recalls 90% of the time/requires cueing < 10% of the time    Medical Problem List and Plan: 1. LE weakness secondary to cardiac arrest/CPR status post CRT-D 02/17/2016 and retroperitoneal hematoma -continue CIR therapies, we'll plan for discharge tomorrow  -Will see patient in clinic for NCS/EMG 2. DVT Prophylaxis/Anticoagulation: Subcutaneous heparin. Monitor platelet counts and any signs of bleeding 3. Pain Management/chronic back pain: Oxycodone as needed  Voltaren gel for right knee and ankle 4. Hypertension/ischemic cardiomyopathy. Amiodarone 400 mg daily, Coreg 25 mg daily, Lasix 40 mg daily,BIDIL 20-30 7.5 mg tablet 0.5 3 times a day.   Continues to be labile, pt asyptomatic Filed Vitals:   03/08/16 1755 03/09/16 0500  BP: 121/60 137/60  Pulse: 81 63  Temp:  97.9 F (36.6 C)  Resp:  18  5. Neuropsych: This patient is capable of making decisions on his own behalf. 6. Skin/Wound Care: Routine skin checks 7. Fluids/Electrolytes/Nutrition: Routine I&O   Hyponatremia: 138 on 6/14 8. Chronic renal insufficiency. Baseline creatinine 1.80-2.00.   Creatinine 1.69 on 6/14 9. Systolic congestive heart failure. Continue Lasix 40 mg daily. The patient daily 10. Hypothyroidism. Synthroid. TSH 2.861 11. 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 Additionally, there is hematoma overlying the psoas muscles.RIghtL2,3 nerve roots primarily affected  -cont conservative mgt for now  -Hb 9.9 on 6/14 12. DM type 2  HbA1c 7.1 on 5/18  CBG (last 3)   Recent Labs  03/06/16 1122  GLUCAP 109*    Will cont to monitor and initiate  medications if warranted 13. Obesity  Body mass index is 33.16 kg/(m^2).  Diet and exercise education  Will cont to encourage weight loss to increase endurance and promote overall health 14. RLE pain  Right tib/fib X-ray from 6/13 reviewed, normal. However, possibility of purulent fracture still exists. Encouraged patient to ambulate as tolerated in relation to pain.  Unlikely gout  Right ankle and knee pain possibly secondary to OA +/- nerve injury. Will order x-rays.  LOS (Days) 16 A FACE TO FACE EVALUATION WAS PERFORMED  Neale Marzette [name] 03/09/2016 9:12 AM

## 2016-03-10 ENCOUNTER — Inpatient Hospital Stay (HOSPITAL_COMMUNITY): Payer: BC Managed Care – PPO

## 2016-03-10 ENCOUNTER — Encounter (HOSPITAL_COMMUNITY): Payer: BC Managed Care – PPO

## 2016-03-10 DIAGNOSIS — S7410XD Injury of femoral nerve at hip and thigh level, unspecified leg, subsequent encounter: Secondary | ICD-10-CM

## 2016-03-10 MED ORDER — GABAPENTIN 300 MG PO CAPS: 300.0000 mg | ORAL_CAPSULE | Freq: Every day | ORAL | Status: DC

## 2016-03-10 NOTE — Progress Notes (Signed)
Pt discharged to home with family. Given discharge instructions by RN.

## 2016-03-10 NOTE — Progress Notes (Signed)
Social Work  Discharge Note  The overall goal for the admission was met for:   Discharge location: Yes-HOME WITH WIFE WHO CAN PROVIDE 24 HR SUPERVISION  Length of Stay: Yes-17 DAYS  Discharge activity level: Yes-MOD/I-SUPERVISION LEVEL  Home/community participation: Yes  Services provided included: MD, RD, PT, OT, SLP, RN, CM, TR, Pharmacy and SW  Financial Services: Private Insurance: [place] BCBS  Follow-up services arranged: Home Health: [facility]-PT, OT, RN, DME: [facility]-WIDE ROLLING WALKER, [place] and Patient/Family has no preference for HH/DME agencies  Comments (or additional information):WIFE WAS [place] FEELS [place]. PT READY TO GO HOME. AWARE TRANSPORT CHAIR & WHEELCHAIR NOT RECOMMENDED DUE TO WALKING A LONG DISTANCE-OVER 500 FEET. PT WILL GET ON HIS OWN IF DECIDES HE WANTS ONE  Patient/Family verbalized understanding of follow-up arrangements: Yes  Individual responsible for coordination of the follow-up plan: SELF & VON-WIFE  Confirmed correct DME delivered: [name] 03/10/2016    [name]

## 2016-03-10 NOTE — Discharge Instructions (Signed)
Inpatient Rehab Discharge Instructions  [place] Discharge date and time: 03/10/16   Activities/Precautions/ Functional Status: Activity: activity as tolerated Diet: diabetic diet Wound Care: none needed   Functional status:  ___ No restrictions     ___ Walk up steps independently ___ 24/7 supervision/assistance   ___ Walk up steps with assistance _X__ Intermittent supervision/assistance  ___ Bathe/dress independently ___ Walk with walker     _X__ Bathe/dress with assistance ___ Walk Independently    ___ Shower independently ___ Walk with assistance    ___ Shower with assistance _X__ No alcohol     ___ Return to work/school ________   Special Instructions: 1.  No driving till cleared by Cardiology.    NO ELIQUIS    COMMUNITY REFERRALS UPON DISCHARGE:    Home Health:   PT, OT, RN  Agency:[facility]   [phone]   Date of last service:03/10/2016  Medical Equipment/Items Ordered: WIDE ROLLING WALKER, [place] [place]  Agency/Supplier:[facility]  [phone]   My questions have been answered and I understand these instructions. I will adhere to these goals and the provided educational materials after my discharge from the hospital.  Patient/Caregiver Signature _______________________________ Date __________  Clinician Signature _______________________________________ Date __________  Please bring this form and your medication list with you to all your follow-up doctor's appointments.

## 2016-03-10 NOTE — Progress Notes (Signed)
Reviewed report from X rays--will order follow up films as recommended

## 2016-03-10 NOTE — Progress Notes (Signed)
PHYSICAL MEDICINE & REHABILITATION     PROGRESS NOTE  Subjective/Complaints:  Right medial ankle pain mainly at night, gets better as he walks on it. Reviewed x-ray reports as well as the actual films. No evidence of acute abnormalities. Small midfoot osteophytes not correlating with location of pain.  ROS: +Right leg pain. Denies CP, SOB, nausea, vomiting, diarrhea.  Objective: Vital Signs: Blood pressure 136/66, pulse 68, temperature 98.9 F (37.2 C), temperature source Oral, resp. rate 18, height 5\' 9"  (1.753 m), weight 102 kg (224 lb 13.9 oz), SpO2 100 %. Dg Knee 1-2 Views Right  03/09/2016  CLINICAL DATA:  Right knee and ankle pain with rehabilitation post MI EXAM: RIGHT KNEE - 1-2 VIEW COMPARISON:  03/09/2016 FINDINGS: No evidence of fracture, dislocation, or joint effusion. No evidence of arthropathy or other focal bone abnormality. Soft tissues are unremarkable. IMPRESSION: Negative. Electronically Signed   By: Nolon Nations M.D.   On: 03/09/2016 14:51   Dg Ankle 2 Views Right  03/09/2016  CLINICAL DATA:  Right knee and ankle pain with rehabilitation post MI EXAM: RIGHT ANKLE - 2 VIEW COMPARISON:  03/07/2016 FINDINGS: No evidence for acute fracture or subluxation. The mortise is intact. Soft tissues are unremarkable. IMPRESSION: No evidence for acute abnormality on the views provided. There strong suspicion for fracture, completion of the exam with oblique view is recommended. Electronically Signed   By: Nolon Nations M.D.   On: 03/09/2016 14:55   Dg Ankle Complete Right  03/10/2016  CLINICAL DATA:  65 year old male with right ankle pain and swelling for 1 week with no known injury. Initial encounter. EXAM: RIGHT ANKLE - COMPLETE 3+ VIEW COMPARISON:  03/09/2016 two-view right ankle series. Tib-fib series [id number]. FINDINGS: No evidence of joint effusion. Bone mineralization is within normal limits. Calcaneus appears stable and intact. Preserved mortise joint alignment. Talar  dome intact. Distal tibia and fibula intact. No acute osseous abnormality identified. Mild degenerative spurring at the dorsal talus re - demonstrated. IMPRESSION: No significant osseous abnormality identified about the right ankle. Electronically Signed   By: Genevie Ann M.D.   On: 03/10/2016 08:09    Recent Labs  03/08/16 0603  WBC 4.5  HGB 9.9*  HCT 31.1*  PLT 256    Recent Labs  03/08/16 0603  NA 138  K 3.9  CL 107  GLUCOSE 95  BUN 25*  CREATININE 1.69*  CALCIUM 9.3   CBG (last 3)  No results for input(s): GLUCAP in the last 72 hours.  Wt Readings from Last 3 Encounters:  03/10/16 102 kg (224 lb 13.9 oz)  02/22/16 107.094 kg (236 lb 1.6 oz)  12/31/15 109.825 kg (242 lb 1.9 oz)    Physical Exam:  BP 136/66 mmHg  Pulse 68  Temp(Src) 98.9 F (37.2 C) (Oral)  Resp 18  Ht 5\' 9"  (1.753 m)  Wt 102 kg (224 lb 13.9 oz)  BMI 33.19 kg/m2  SpO2 100% Constitutional: He appears well-developed and well-nourished. NAD. HENT: Normocephalic and atraumatic.  Eyes: EOM and conjunctiva are normal.   Cardiovascular: Normal rate. Exam reveals no friction rub. No murmur heard. Respiratory: No respiratory distress. He has no wheezes. He has no rales.  Musculoskeletal:  Pain with provocative testing along the tibia, stable  Neurological:  Diminished sensation to light touch bilateral anterior and lateral thighs, right greater than left (Improving) Motor: Bilateral upper extremities 5/5 proximal to distal Right lower extremity: Hip flexion 2+/5, knee extension 3-/5, ankle dorsi/plantar flexion 4/5 (All improving) Left  lower extremity: Hip flexion 4/5 proximal to distal, knee extension 4+/5, ankle dorsi/plantar flexion 5/5  Skin: Pacemaker site C/D/I   Assessment/Plan: 1. 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  Posey Pronto See D/C summary See D/C instructions Function:  Bathing Bathing position   Position: Shower  Bathing parts Body parts bathed by patient: Right arm, Left arm, Chest, Abdomen, Front perineal area, Buttocks, Right upper leg, Left upper leg, Right lower leg, Left lower leg, Back Body parts bathed by helper: Right lower leg, Left lower leg, Back  Bathing assist Assist Level: Supervision or verbal cues      Upper Body Dressing/Undressing Upper body dressing   What is the patient wearing?: Pull over shirt/dress     Pull over shirt/dress - Perfomed by patient: Thread/unthread right sleeve, Thread/unthread left sleeve, Put head through opening, Pull shirt over trunk Pull over shirt/dress - Perfomed by helper: Pull shirt over trunk        Upper body assist Assist Level: Set up   Set up : To obtain clothing/put away  Lower Body Dressing/Undressing Lower body dressing   What is the patient wearing?: Pants, Shoes, [employer]  - Performed by patient: Thread/unthread right underwear leg, Thread/unthread left underwear leg, Pull underwear up/down Underwear - Performed by helper: Pull underwear up/down, Thread/unthread left underwear leg, Thread/unthread right underwear leg Pants- Performed by patient: Thread/unthread right pants leg, Thread/unthread left pants leg, Pull pants up/down Pants- Performed by helper: Thread/unthread left pants leg, Pull pants up/down   Non-skid slipper socks- Performed by helper: Don/doff right sock, Don/doff left sock     Shoes - Performed by patient: Don/doff right shoe, Don/doff left shoe, Fasten right, Fasten left Shoes - Performed by helper: Don/doff right shoe, Don/doff left shoe, Fasten right, Fasten left       TED Hose - Performed by helper: Don/doff right TED hose, Don/doff left TED hose  Lower body assist Assist for lower body dressing: Set up   Set up : To obtain clothing/put away  Toileting Toileting Toileting activity did not occur:  No continent bowel/bladder event Toileting steps completed by patient: Adjust clothing prior to toileting, Performs perineal hygiene, Adjust clothing after toileting Toileting steps completed by helper: Adjust clothing after toileting Toileting Assistive Devices: Grab bar or rail  Toileting assist Assist level: No help/no cues   Transfers Chair/bed transfer   Chair/bed transfer method: Ambulatory Chair/bed transfer assist level: Supervision or verbal cues Chair/bed transfer assistive device: Armrests, Medical sales representative     Max distance: 269ft Assist level: Supervision or verbal cues   Wheelchair   Type: Manual Max wheelchair distance: 150 Assist Level: No help, No cues, assistive device, takes more than reasonable amount of time  Cognition Comprehension Comprehension assist level: Follows complex conversation/direction with no assist  Expression Expression assist level: Expresses complex ideas: With no assist  Social Interaction Social Interaction assist level: Interacts appropriately with others - No medications needed.  Problem Solving Problem solving assist level: Solves complex problems: Recognizes & self-corrects  Memory Memory assist level: Complete Independence: No helper    Medical Problem List and Plan: 1. LE weakness secondary to cardiac arrest/CPR status post CRT-D 02/17/2016 and retroperitoneal hematoma Stable for discharge  -Will see patient in clinic for NCS/EMG 2. DVT Prophylaxis/Anticoagulation: Subcutaneous heparin. Monitor platelet counts and any signs  of bleeding 3. Pain Management/chronic back pain: Oxycodone as needed  Voltaren gel for right knee and ankle 4. Hypertension/ischemic cardiomyopathy. Amiodarone 400 mg daily, Coreg 25 mg daily, Lasix 40 mg daily,BIDIL 20-30 7.5 mg tablet 0.5 3 times a day.   Continues to be labile, pt asyptomatic Filed Vitals:   03/09/16 1320 03/10/16 0548  BP: 116/60 136/66  Pulse: 78 68  Temp:  98.1 F (36.7 C) 98.9 F (37.2 C)  Resp: 18 18  5. Neuropsych: This patient is capable of making decisions on his own behalf. 6. Skin/Wound Care: Routine skin checks 7. Fluids/Electrolytes/Nutrition: Routine I&O   Hyponatremia: 138 on 6/14 8. Chronic renal insufficiency. Baseline creatinine 1.80-2.00.   Creatinine 1.69 on 6/14 9. Systolic congestive heart failure. Continue Lasix 40 mg daily. The patient daily 10. Hypothyroidism. Synthroid. TSH 2.861 11. 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 Additionally, there is hematoma overlying the psoas muscles.RIghtL2,3 nerve roots primarily affected  -cont conservative mgt for now  -Hb 9.9 on 6/14 12. DM type 2  HbA1c 7.1 on 5/18  CBG (last 3)  No results for input(s): GLUCAP in the last 72 hours.  Will cont to monitor and initiate medications if warranted 13. Obesity  Body mass index is 33.19 kg/(m^2).  Diet and exercise education  Will cont to encourage weight loss to increase endurance and promote overall health 14. RLE pain  X-rays negative. Exam is unremarkable. Pain is mainly along the medial malleolus and distal medial tibial area. 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

## 2016-03-13 ENCOUNTER — Telehealth: Payer: Self-pay | Admitting: Internal Medicine

## 2016-03-13 NOTE — Telephone Encounter (Signed)
New Message   Pt call requesting to speak with RN. Pt states he need to make an earlier appt for Dr. Lovena Le. I informed him of his appt that was perviously scheduled for 8/31. Pt stated he received a call stating he need to make a two week appt and the one he had was to far for him to wait.   Pt also questioned his blood thinner. Pt stated he was on a blood thinner prior to hospital visit. Post hospital he was taken off and wants to know if he needs to start a new blood thinner? Please call back to discuss

## 2016-03-14 NOTE — Telephone Encounter (Signed)
Discussed with Dr Lovena Le.  Dr Tamala Julian is his primary cardiologist and he needs a TCM appointment.  He went to inpatient rehab after discharge and is now home.  Will have Melissa call him to schedule him an appointment with PA or Dr Tamala Julian ASAP to discuss questions and concerns.  He will keep scheduled appointment with Dr Lovena Le for ICD.

## 2016-03-15 ENCOUNTER — Telehealth: Payer: Self-pay | Admitting: Family Medicine

## 2016-03-15 NOTE — Telephone Encounter (Signed)
He does not require anticoagulation at discharge. He should ambulate as much as possible. Thanks.

## 2016-03-15 NOTE — Telephone Encounter (Signed)
Patient called stating while he was in the hospital he was on a blood thinner and at discharge he did not receive any blood thinner information or a RX for 1. He wants to know if he should be taking a blood thinner and if so can we send 1 in for him? Please advise

## 2016-03-15 NOTE — Telephone Encounter (Signed)
Patient notified

## 2016-03-16 NOTE — Consult Note (Signed)
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  He reported having retrograde amnesia for activities leading up to his medical event.  He stated that he has noticed slowed processing speed, but has not been aware of other cognitive changes.  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  He wore glasses as needed throughout the examination and vision and hearing appeared adequate for testing purposes.    PROCEDURES: [2 units of 84166 on 03/10/2016]  The following tests were performed during today's visit: Mini Mental Status Examination - 2, brief version, Repeatable Battery for the Assessment of Neuropsychological Status (RBANS, form B), Geriatric Anxiety Inventory, and the Geriatric Depression Scale (short form).  Test results are as follows:   MMSE-2 brief Raw Score = 15/16 Description = WNL   RBANS Indices Scaled Score Percentile Description  Immediate Memory  100 50 Average  Visuospatial/Constructional 102* 55 Average*  Language 96 39 Average  Attention 88 21 Below Average  Delayed Memory 75 5 Impaired  Total Score 88 21 Below Average   RBANS Subtests Raw Score Percentile Description  List Learning 26 32 Average  Story Memory 21 77 Above Average  Figure Copy 20 87 Above Average  Line Orientation 13* 10 Below Average  Picture Naming 10 73 Average  Semantic Fluency 18 25 Average  Digit Span 10 45 Average  Coding 34 6 Impaired  List Recall 4 18 Below Average  List Recognition 16 < 1 Profoundly Impaired  Story Recall 8 25 Average  Figure recall 11 25 Average  *Score was adversely impacted by slowed processing speed; visuospatial abilities were intact  Geriatric Depression Scale (short form) Raw Score = 3/15 Description =  WNL   Geriatric Anxiety Inventory Raw Score = 3/20 Description = WNL   RESULTS AND RECOMMENDATIONS:  Test results revealed mostly intact performances, but with slowed processing speed evident throughout.  It is notable that his score on a measure of visual discrimination was below average when time limits were imposed, but was above average when time limits were not taken into account.  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  It is not unusual to have some cognitive slowing, trouble learning, and reduced executive functioning abilities following anoxic injury and the expected course of cognitive recovery was discussed with [name].  He was advised that before he returns to work, he may want to have a comprehensive neuropsychological evaluation and may need to function at a reduced clinical load with a scribe or dictation system to compensate for any memory issues and/or slowed processing speed.  There was no evidence of clinically significant depression or anxiety at this time that would be exacerbating cognitive struggles.    Marlane Hatcher, Psy.D.  Clinical Neuropsychologist

## 2016-03-20 ENCOUNTER — Ambulatory Visit (INDEPENDENT_AMBULATORY_CARE_PROVIDER_SITE_OTHER): Payer: BC Managed Care – PPO | Admitting: *Deleted

## 2016-03-20 ENCOUNTER — Ambulatory Visit (INDEPENDENT_AMBULATORY_CARE_PROVIDER_SITE_OTHER): Payer: BC Managed Care – PPO | Admitting: Nurse Practitioner

## 2016-03-20 ENCOUNTER — Encounter: Payer: Self-pay | Admitting: Internal Medicine

## 2016-03-20 ENCOUNTER — Encounter: Payer: Self-pay | Admitting: Nurse Practitioner

## 2016-03-20 VITALS — BP 92/64 | HR 81 | Ht 69.0 in | Wt 226.0 lb

## 2016-03-20 DIAGNOSIS — I469 Cardiac arrest, cause unspecified: Secondary | ICD-10-CM | POA: Diagnosis not present

## 2016-03-20 DIAGNOSIS — I4901 Ventricular fibrillation: Secondary | ICD-10-CM | POA: Diagnosis not present

## 2016-03-20 DIAGNOSIS — I5022 Chronic systolic (congestive) heart failure: Secondary | ICD-10-CM

## 2016-03-20 DIAGNOSIS — Z95810 Presence of automatic (implantable) cardiac defibrillator: Secondary | ICD-10-CM

## 2016-03-20 LAB — CUP PACEART INCLINIC DEVICE CHECK
Battery Remaining Longevity: 82.8
Brady Statistic RA Percent Paced: 0.66 %
Brady Statistic RV Percent Paced: 98 %
Date Time Interrogation Session: 20170626165212
HighPow Impedance: 57.375
Implantable Lead Implant Date: 20170525
Implantable Lead Implant Date: 20170525
Implantable Lead Location: 753858
Implantable Lead Location: 753859
Implantable Lead Location: 753860
Implantable Lead Model: 7122
Lead Channel Impedance Value: 462.5 Ohm
Lead Channel Pacing Threshold Amplitude: 0.75 V
Lead Channel Pacing Threshold Pulse Width: 0.5 ms
Lead Channel Pacing Threshold Pulse Width: 0.5 ms
Lead Channel Sensing Intrinsic Amplitude: 11.4 mV
Lead Channel Sensing Intrinsic Amplitude: 4 mV
Lead Channel Setting Pacing Amplitude: 3.5 V
Lead Channel Setting Pacing Pulse Width: 0.5 ms
Lead Channel Setting Sensing Sensitivity: 0.5 mV
MDC IDC LEAD IMPLANT DT: 20170525
MDC IDC MSMT LEADCHNL LV IMPEDANCE VALUE: 637.5 Ohm
MDC IDC MSMT LEADCHNL LV PACING THRESHOLD AMPLITUDE: 0.875 V
MDC IDC MSMT LEADCHNL RA IMPEDANCE VALUE: 450 Ohm
MDC IDC MSMT LEADCHNL RV PACING THRESHOLD AMPLITUDE: 0.5 V
MDC IDC MSMT LEADCHNL RV PACING THRESHOLD PULSEWIDTH: 0.5 ms
MDC IDC SET LEADCHNL LV PACING AMPLITUDE: 2 V
MDC IDC SET LEADCHNL RV PACING AMPLITUDE: 2 V
MDC IDC SET LEADCHNL RV PACING PULSEWIDTH: 0.5 ms
Pulse Gen Serial Number: 7357926

## 2016-03-20 LAB — CBC WITH DIFFERENTIAL/PLATELET
Basophils Absolute: 0 cells/uL (ref 0–200)
Basophils Relative: 0 %
Eosinophils Absolute: 0 cells/uL — ABNORMAL LOW (ref 15–500)
Eosinophils Relative: 0 %
HCT: 30.6 % — ABNORMAL LOW (ref 38.5–50.0)
Hemoglobin: 10 g/dL — ABNORMAL LOW (ref 13.2–17.1)
Lymphocytes Relative: 14 %
Lymphs Abs: 1414 cells/uL (ref 850–3900)
MCH: 30.4 pg (ref 27.0–33.0)
MCHC: 32.7 g/dL (ref 32.0–36.0)
MCV: 93 fL (ref 80.0–100.0)
MPV: 9.7 fL (ref 7.5–12.5)
Monocytes Absolute: 1212 cells/uL — ABNORMAL HIGH (ref 200–950)
Monocytes Relative: 12 %
Neutro Abs: 7474 cells/uL (ref 1500–7800)
Neutrophils Relative %: 74 %
Platelets: 293 10*3/uL (ref 140–400)
RBC: 3.29 MIL/uL — ABNORMAL LOW (ref 4.20–5.80)
RDW: 15.9 % — ABNORMAL HIGH (ref 11.0–15.0)
WBC: 10.1 10*3/uL (ref 3.8–10.8)

## 2016-03-20 NOTE — Progress Notes (Signed)
Wound check appointment. Steri-strips removed. Wound without redness or edema. Incision edges approximated, wound well healed. Normal device function. Thresholds, sensing, and impedances consistent with implant measurements. Device programmed at 3.5V (RA) and autocapture programmed on (RV) for extra safety margin until 3 month visit. Histogram distribution appropriate for patient and level of activity. 9 mode switches (<1%), AT, longest 8 sec. No ventricular arrhythmias noted. PMT episodes noted by device, AT and true PMT, algorithm effective, V-A conduction ~316ms today, PVARP already at 33ms. Patient educated about wound care, arm mobility, lifting restrictions, shock plan. ROV with GT on 05/25/16.

## 2016-03-20 NOTE — Patient Instructions (Addendum)
We will be checking the following labs today - BMET, CBC, HPF   Medication Instructions:    Continue with your current medicines.     Testing/Procedures To Be Arranged:  Device check today  Follow-Up:   See Dr. Tamala Julian and Dr. Lovena Le as planned    Other Special Instructions:   Keep restricting your salt  Try to weigh daily - call us for weight gain of 2 or more pounds overnight  Use knee high support stockings if needed.     If you need a refill on your cardiac medications before your next appointment, please call your pharmacy.   Call the [place] office at [phone] if you have any questions, problems or concerns.

## 2016-03-20 NOTE — Addendum Note (Signed)
Addended by: Eulis Foster on: 03/20/2016 01:32 PM   Modules accepted: Orders

## 2016-03-20 NOTE — Progress Notes (Signed)
CARDIOLOGY OFFICE NOTE  Date:  03/20/2016    Eric [name] Date of Birth: 1951-09-04 Medical Record [id number]  PCP:  [name], MD  Cardiologist:  Smith/Taylor   Chief Complaint  Patient presents with  . Post cardiac arrest - seen for Dr. Fransisca Kaufmann hospital visit - seen for Dr. Tamala Julian    History of Present Illness: Eric [name] is a 65 y.o. male who presents today for a post hospital visit. Seen for Dr. Tamala Julian and Dr. Lovena Le.   He has had a history of systolic heart failure, atrial fibrillation, anticoagulation therapy with Eliquis, hypertension, CK D stage III, DM, and intermittent exertional chest discomfort.  Last seen here back in early April - felt to be doing ok.   He presented on Jan 31, 2016 with cardiac arrest while at [facility]. Wife initiated CPR. EMS arrived within 5-10 minutes, received two fibrillations. Troponin 0.11. Lactic acid 1.2. The patient was intubated for airway protection, extubated on Feb 06, 2016. Echocardiogram with ejection fraction of 35%, diffuse hypokinesis with grade 2 diastolic dysfunction. Initially, was placed on intravenous amiodarone, discontinued due to bradycardia, later resumed. TEE completed with moderate to severe global reduction in LV function, no evidence of vegetation. Systolic function moderately to severely reduced. Renal Services consulted for creatinine 2.87 to 3.15. Renal ultrasound without obstruction, steadily improved with gentle IV fluids. Latest creatinine 1.53. He underwent cardiac catheterization after renal function stabilized - stable findings.  He was maintained on subcutaneous heparin for DVT prophylaxis. Acute on chronic anemia 9.4, monitored. The patient with noted bilateral lower extremity weakness. MRI and CT lumbar spine, abdomen, pelvis revealed a retroperitoneal hematoma. It was felt this weakness was due to a femoral nerve compression although imaging also noted for hemorrhage  around both psoas and iliacus muscles - managed conservatively. He did end up getting ICD placed by Dr. Lovena Le on 5/25. Discharged to rehab. Discharged home on June 15th.   Comes back today. Here with his wife. He thinks he is doing ok. They were in the process of moving when this event happened - still trying to get moved. No chest pain. His breathing is ok. Coughing up a little yellow phlegm - this was present while hospitalized - not getting worse. No fever. He is using a walker. Still with some numbness in the right thigh. Some swelling in the right leg. No palpitations. No chest pain. Still has dressing in place over ICD. Remains on 400 mg of amiodarone. Getting therapy at home. Understands he cannot drive for 6 months.   Past Medical History  Diagnosis Date  . Claustrophobia   . Heart murmur   . Hypertension   . Varicose vein of leg     right  . Dysrhythmia     "palpitations"  . Migraine 02/13/12    "opthalmic"  . Chronic lower back pain   . Exertional dyspnea 01/2012  . CHF (congestive heart failure) ([place])   . Hypothyroidism   . Chronic kidney disease     kidney fx studies increased     Past Surgical History  Procedure Laterality Date  . Skin melanocytoma excision  2012    "above left clavicle"  . Finger surgery  2012    "4th digit right hand; thumb on left hand"  . Tee without cardioversion  03/22/2012    Procedure: TRANSESOPHAGEAL ECHOCARDIOGRAM (TEE);  Surgeon: Candee Furbish, MD;  Location: [facility] ENDOSCOPY;  Service: Cardiovascular;  Laterality: N/A;  . Cardioversion  03/22/2012    Procedure: CARDIOVERSION;  Surgeon: Candee Furbish, MD;  Location: [facility] ENDOSCOPY;  Service: Cardiovascular;  Laterality: N/A;  . Cardioversion  04/19/2012    Procedure: CARDIOVERSION;  Surgeon: Sinclair Grooms, MD;  Location: [place];  Service: Cardiovascular;  Laterality: N/A;  . Colonoscopy N/A 04/11/2013    Procedure: COLONOSCOPY;  Surgeon: Inda Castle, MD;  Location: [facility] ENDOSCOPY;  Service:  Endoscopy;  Laterality: N/A;  . Colonoscopy N/A 04/11/2013    Procedure: COLONOSCOPY;  Surgeon: Inda Castle, MD;  Location: [facility] ENDOSCOPY;  Service: Endoscopy;  Laterality: N/A;  . Tee without cardioversion N/A 02/08/2016    Procedure: TRANSESOPHAGEAL ECHOCARDIOGRAM (TEE);  Surgeon: Lelon Perla, MD;  Location: [facility] ENDOSCOPY;  Service: Cardiovascular;  Laterality: N/A;  . Radiology with anesthesia N/A 02/11/2016    Procedure: MRI OF THE BRAIN WITHOUT CONTRAST, LUMBAR WITHOUT CONTRAST;  Surgeon: Medication Radiologist, MD;  Location: [place];  Service: Radiology;  Laterality: N/A;  DR. WOOD/MRI  . Cardiac catheterization N/A 02/17/2016    Procedure: Left Heart Cath and Coronary Angiography;  Surgeon: Jettie Booze, MD;  Location: [place] CV [facility];  Service: Cardiovascular;  Laterality: N/A;  . Ep implantable device N/A 02/17/2016    Procedure: BiV ICD Insertion CRT-D;  Surgeon: Evans Lance, MD;  Location: [place] [facility];  Service: Cardiovascular;  Laterality: N/A;     Medications: Current Outpatient Prescriptions  Medication Sig Dispense Refill  . amiodarone (PACERONE) 400 MG tablet Take 1 tablet (400 mg total) by mouth daily. 30 tablet 1  . carvedilol (COREG) 25 MG tablet Take 1 tablet (25 mg total) by mouth 2 (two) times daily. 60 tablet 0  . diclofenac sodium (VOLTAREN) 1 % GEL Apply 2 g topically 4 (four) times daily. 1 Tube 1  . docusate sodium (COLACE) 100 MG capsule Take 100 mg by mouth daily as needed for mild constipation.    . fluticasone (FLONASE) 50 MCG/ACT nasal spray Place 2 sprays into the nose as needed for allergies.     . furosemide (LASIX) 40 MG tablet Take 1 tablet (40 mg total) by mouth daily. 90 tablet 0  . gabapentin (NEURONTIN) 300 MG capsule Take 1 capsule (300 mg total) by mouth at bedtime. 30 capsule 0  . isosorbide-hydrALAZINE (BIDIL) 20-37.5 MG tablet Take 0.5 tablets by mouth 3 (three) times daily. 90 tablet 0  . levothyroxine (SYNTHROID,  LEVOTHROID) 75 MCG tablet Take 1 tablet (75 mcg total) by mouth daily before breakfast. 30 tablet 11  . magnesium oxide (MAG-OX) 400 MG tablet Take 1 tablet (400 mg total) by mouth daily. 30 tablet 1  . oxyCODONE-acetaminophen (PERCOCET/ROXICET) 5-325 MG tablet Take 1 tablet by mouth every 6 (six) hours as needed for moderate pain. 90 tablet 0   No current facility-administered medications for this visit.    Allergies: Allergies  Allergen Reactions  . Lactose Intolerance (Gi) Diarrhea    Social History: The patient  reports that he has never smoked. He has never used smokeless tobacco. He reports that he drinks alcohol. He reports that he does not use illicit drugs.   Family History: The patient's family history includes Diabetes in his mother; Heart disease in his father and mother; Heart failure in his father, mother, and mother; Hypertension in his father and mother.   Review of Systems: Please see the history of present illness.   Otherwise, the review of systems is positive for none.   All other systems are reviewed and negative.  Physical Exam: VS:  BP 92/64 mmHg  Pulse 81  Ht 5\' 9"  (1.753 m)  Wt 226 lb (102.513 kg)  BMI 33.36 kg/m2 .  BMI Body mass index is 33.36 kg/(m^2).  Wt Readings from Last 3 Encounters:  03/20/16 226 lb (102.513 kg)  03/10/16 224 lb 13.9 oz (102 kg)  02/22/16 236 lb 1.6 oz (107.094 kg)    General: Pleasant. Alert black male who is in no acute distress.  HEENT: Normal. Neck: Supple, no JVD, carotid bruits, or masses noted.  Cardiac: Regular rate and rhythm. No murmurs, rubs, or gallops. Trace edema on the right. ICD in the left upper chest - dressing removed. Still has steri strips in place.  Respiratory:  Lungs are clear to auscultation bilaterally with normal work of breathing.  GI: Soft and nontender.  MS: No deformity or atrophy. Gait and ROM intact. He is using a walker.  Skin: Warm and dry. Color is normal.  Neuro:  Strength and sensation  are intact and no gross focal deficits noted.  Psych: Alert, appropriate and with normal affect.   LABORATORY DATA:  EKG:  EKG is ordered today. This demonstrates NSR. Looks to be V pacing.   Lab Results  Component Value Date   WBC 4.5 03/08/2016   HGB 9.9* 03/08/2016   HCT 31.1* 03/08/2016   PLT 256 03/08/2016   GLUCOSE 95 03/08/2016   CHOL 161 02/13/2012   TRIG 82 02/01/2016   HDL 42 02/13/2012   LDLCALC 89 02/13/2012   ALT 42 02/23/2016   AST 82* 02/23/2016   NA 138 03/08/2016   K 3.9 03/08/2016   CL 107 03/08/2016   CREATININE 1.69* 03/08/2016   BUN 25* 03/08/2016   CO2 24 03/08/2016   TSH 2.861 02/11/2016   INR 1.42 02/11/2016   HGBA1C 7.1* 02/10/2016    BNP (last 3 results)  Recent Labs  01/31/16 2206 02/05/16 1522  BNP 406.8* 804.7*    ProBNP (last 3 results) No results for input(s): PROBNP in the last 8760 hours.   Other Studies Reviewed Today: Procedures    Left Heart Cath and Coronary Angiography from 01/2016    Conclusion     Small branch of 2nd Diag lesion, 99% stenosed.  Otherwise nonobstructive coronary artery disease.  Mildly elevated LVEDP.  Small branch with significant disease. Major vessels are widely patent. Will inform Dr. Tamala Julian and Dr. Lovena Le. Decision to be made regarding proceeding with AICD placement. Continue aggressive medical therapy as well. Will gently hydrate postprocedure to help prevent contrast-induced nephropathy.   TEE Study Conclusions from 01/2016  - Left ventricle: Systolic function was moderately to severely  reduced. The estimated ejection fraction was in the range of 30%  to 35%. Diffuse hypokinesis. - Aortic valve: No evidence of vegetation. There was moderate  regurgitation. - Mitral valve: No evidence of vegetation. - Left atrium: The atrium was moderately dilated. There was  spontaneous echo contrast (&quot;smoke&quot;). - Right atrium: No evidence of thrombus in the atrial cavity or   appendage. - Atrial septum: No defect or patent foramen ovale was identified.  There was an atrial septal aneurysm. - Tricuspid valve: No evidence of vegetation. - Pulmonic valve: No evidence of vegetation.  Impressions:  - Moderate to severe global reduction in LV function; moderate LAE;  severely dilated aortic root (5.2 cm); moderate (3+) AI likely  due to aortic root dilatation; mild MR   TTE Study Conclusions from 01/2016  - Left ventricle: The cavity size was severely dilated.  There was  mild focal basal hypertrophy of the septum. Systolic function was  moderately to severely reduced. The estimated ejection fraction  was in the range of 30% to 35%. Diffuse hypokinesis. Features are  consistent with a pseudonormal left ventricular filling pattern,  with concomitant abnormal relaxation and increased filling  pressure (grade 2 diastolic dysfunction). Doppler parameters are  consistent with elevated ventricular end-diastolic filling  pressure. - Ventricular septum: Septal motion showed abnormal function and  dyssynergy. - Aortic valve: There was severe regurgitation. - Aortic root: The aortic root was dilated measurinh 49 mm. - Ascending aorta: The ascending aorta was dilated measuring 43 mm. - Left atrium: The atrium was moderately dilated. - Right ventricle: The cavity size was normal. Wall thickness was  normal. Systolic function was moderately reduced. - Right atrium: The atrium was normal in size. - Tricuspid valve: There was mild regurgitation. - Pulmonic valve: There was moderate regurgitation. - Pulmonary arteries: Systolic pressure was within the normal  range. PA peak pressure: 32 mm Hg (S). - Inferior vena cava: The vessel was normal in size. - Pericardium, extracardiac: There was no pericardial effusion.  There was a left pleural effusion.  Impressions:  - Dilated aortc root, with bicuspid aortic valve and severe aortic  regurgitation with  eccentric jet. Mildly dilated ascending aorta.   Assessment/Plan: 1. S/P cardiac arrest, presumed VT/VF VF on AED interrogation. S/P CRT-D as he has HF ad LBBB for secondary prevention following results of cath. Doing well clinically - getting therapy at home now. Slow progress - compounded by moving to new home. Still needs walker due to right leg weakness. Needs follow up labs today. Doubt he will be able to go back to work until maybe later this fall.   2. Chronic systolic heart failure-TTE 5/17 reveals EF of [id number], grade 2 diastolic dysfunction. Trying to restrict salt. Getting batteries replaced in his scales. Looks compensated today.   3. 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 TEE 02/08/16 revealed moderate aortic regurgitation with eccentric jet. Aortic root 5.2cm. Consider CT in 6 months. Will defer to Dr. Tamala Julian.   4. Atrial fibrillation-- now in sinus rhythm. On home coreg 25mg  BID. Off anticoagulants due to retroperitoneal bleeding. Continue amiodarone po 400mg  daily. May need to think about cutting the dose - will discuss with Dr. Lovena Le.   5. Hypertension- BP actually low - not symptomatic. No change in medicines for now.   6. Aortic root enlargement - aortic root measured 37mm on TEE -May benefit CT Angio when renal function is clearly stabilized. Would defer to Dr. Tamala Julian.   7. Acute renal failure on CKD (chronic kidney disease), stage III-IV 2/2 rhabdo - resolved - rechecking labs today.  8. Left bundle branch block   9. Hyperlipidemia - resume statin when rhabdo resolved.   10. Anemia-- hgb 7.1 previously from 9.5 secondary to retroperitoneal bleed, discovered by MRI - rechecking lab today.   Current medicines are reviewed with the patient today.  The patient does not have concerns regarding medicines other than what has been noted above.  The following changes have been made:  See above.  Labs/ tests ordered  today include:    Orders Placed This Encounter  Procedures  . EKG 12-Lead     Disposition:   FU with Dr. Tamala Julian in about 4 to 6 weeks. Recheck his labs today. See Dr. Lovena Le as planned. Device check today with wound check. He is to start paperwork for  disability as well.   Patient is agreeable to this plan and will call if any problems develop in the interim.   Signed: Burtis Junes, RN, ANP-C 03/20/2016 12:51 PM  [place] [address] [place], [place]  [address] Phone: [phone] Fax: [phone]

## 2016-03-21 LAB — BASIC METABOLIC PANEL
BUN: 18 mg/dL (ref 7–25)
CO2: 24 mmol/L (ref 20–31)
Calcium: 8.7 mg/dL (ref 8.6–10.3)
Chloride: 104 mmol/L (ref 98–110)
Creat: 1.56 mg/dL — ABNORMAL HIGH (ref 0.70–1.25)
Glucose, Bld: 99 mg/dL (ref 65–99)
Potassium: 4.1 mmol/L (ref 3.5–5.3)
Sodium: 138 mmol/L (ref 135–146)

## 2016-03-21 LAB — HEPATIC FUNCTION PANEL
ALT: 15 U/L (ref 9–46)
AST: 26 U/L (ref 10–35)
Albumin: 3.1 g/dL — ABNORMAL LOW (ref 3.6–5.1)
Alkaline Phosphatase: 50 U/L (ref 40–115)
Bilirubin, Direct: 0.1 mg/dL (ref <=0.2)
Indirect Bilirubin: 0.4 mg/dL (ref 0.2–1.2)
Total Bilirubin: 0.5 mg/dL (ref 0.2–1.2)
Total Protein: 7.1 g/dL (ref 6.1–8.1)

## 2016-03-22 ENCOUNTER — Encounter: Payer: BC Managed Care – PPO | Admitting: Physical Medicine & Rehabilitation

## 2016-03-23 ENCOUNTER — Encounter: Payer: Self-pay | Admitting: Physical Medicine & Rehabilitation

## 2016-03-23 ENCOUNTER — Encounter
Payer: BC Managed Care – PPO | Attending: Physical Medicine & Rehabilitation | Admitting: Physical Medicine & Rehabilitation

## 2016-03-23 VITALS — BP 91/57 | HR 82 | Resp 14

## 2016-03-23 DIAGNOSIS — I429 Cardiomyopathy, unspecified: Secondary | ICD-10-CM | POA: Insufficient documentation

## 2016-03-23 DIAGNOSIS — I5022 Chronic systolic (congestive) heart failure: Secondary | ICD-10-CM | POA: Diagnosis not present

## 2016-03-23 DIAGNOSIS — R29898 Other symptoms and signs involving the musculoskeletal system: Secondary | ICD-10-CM

## 2016-03-24 NOTE — Progress Notes (Signed)
Please see media tab for results NCS/EMG

## 2016-04-24 ENCOUNTER — Ambulatory Visit (INDEPENDENT_AMBULATORY_CARE_PROVIDER_SITE_OTHER): Payer: BC Managed Care – PPO | Admitting: Interventional Cardiology

## 2016-04-24 ENCOUNTER — Encounter: Payer: Self-pay | Admitting: Interventional Cardiology

## 2016-04-24 VITALS — BP 142/80 | HR 76 | Ht 69.0 in | Wt 219.0 lb

## 2016-04-24 DIAGNOSIS — I484 Atypical atrial flutter: Secondary | ICD-10-CM | POA: Diagnosis not present

## 2016-04-24 DIAGNOSIS — I469 Cardiac arrest, cause unspecified: Secondary | ICD-10-CM

## 2016-04-24 DIAGNOSIS — I10 Essential (primary) hypertension: Secondary | ICD-10-CM

## 2016-04-24 DIAGNOSIS — I351 Nonrheumatic aortic (valve) insufficiency: Secondary | ICD-10-CM

## 2016-04-24 DIAGNOSIS — N184 Chronic kidney disease, stage 4 (severe): Secondary | ICD-10-CM

## 2016-04-24 DIAGNOSIS — I5022 Chronic systolic (congestive) heart failure: Secondary | ICD-10-CM

## 2016-04-24 DIAGNOSIS — Z7901 Long term (current) use of anticoagulants: Secondary | ICD-10-CM

## 2016-04-24 DIAGNOSIS — I447 Left bundle-branch block, unspecified: Secondary | ICD-10-CM | POA: Diagnosis not present

## 2016-04-24 MED ORDER — AMIODARONE HCL 200 MG PO TABS: 200.0000 mg | ORAL_TABLET | Freq: Every day | ORAL | 11 refills | Status: DC

## 2016-04-24 MED ORDER — APIXABAN 5 MG PO TABS: 5.0000 mg | ORAL_TABLET | Freq: Two times a day (BID) | ORAL | 11 refills | Status: DC

## 2016-04-24 NOTE — Progress Notes (Signed)
Cardiology Office Note    Date:  04/24/2016   ID:  [name]  MD, DOB July 23, 1951, MRN [id number]  PCP:  [name], MD  Cardiologist: Sinclair Grooms, MD   Chief Complaint  Patient presents with  . Appointment    feeling some pressure from device site  . 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 male nonischemic cardiomyopathy, hypertension, aortic aneurysm, paroxysmal atrial fibrillation, chronic kidney disease stage III-IV, amiodarone therapy, status post cardiac arrest, status post retroperitoneal hematoma, and recently implanted AICD.  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 He had a prolonged hospital stay, was intubated, and has been in rehabilitation. He suffered a large retroperitoneal hematoma. Recovery has been slow. He is now ambulatory. His appetite is improved. Strength is improved. No chest pain. Cardiac catheterization did not reveal any significant major coronary disease other than a small diagonal that had 80% obstruction. It was too small to intervene upon. He has not had palpitations. He denies lower extremity swelling. He is able to lie flat. AICD insertion site has healed nicely.  Past Medical History:  Diagnosis Date  . CHF (congestive heart failure) ([place])   . Chronic kidney disease    kidney fx studies increased   . Chronic lower back pain   . Claustrophobia   . Dysrhythmia    "palpitations"  . Exertional dyspnea 01/2012  . Heart murmur   . Hypertension   . Hypothyroidism   . Migraine 02/13/12   "opthalmic"  . Varicose vein of leg    right    Past Surgical History:  Procedure Laterality Date  . CARDIAC CATHETERIZATION N/A 02/17/2016   Procedure: Left Heart Cath and Coronary Angiography;  Surgeon: Jettie Booze, MD;  Location: [place] [facility];  Service: Cardiovascular;  Laterality:  N/A;  . CARDIOVERSION  03/22/2012   Procedure: CARDIOVERSION;  Surgeon: Candee Furbish, MD;  Location: [facility] ENDOSCOPY;  Service: Cardiovascular;  Laterality: N/A;  . CARDIOVERSION  04/19/2012   Procedure: CARDIOVERSION;  Surgeon: Sinclair Grooms, MD;  Location: [place] [facility];  Service: Cardiovascular;  Laterality: N/A;  . COLONOSCOPY N/A 04/11/2013   Procedure: COLONOSCOPY;  Surgeon: Inda Castle, MD;  Location: [facility] ENDOSCOPY;  Service: Endoscopy;  Laterality: N/A;  . COLONOSCOPY N/A 04/11/2013   Procedure: COLONOSCOPY;  Surgeon: Inda Castle, MD;  Location: [facility] ENDOSCOPY;  Service: Endoscopy;  Laterality: N/A;  . EP IMPLANTABLE DEVICE N/A 02/17/2016   Procedure: BiV ICD Insertion CRT-D;  Surgeon: Evans Lance, MD;  Location: [place] [facility];  Service: Cardiovascular;  Laterality: N/A;  . FINGER SURGERY  2012   "4th digit right hand; thumb on left hand"  . RADIOLOGY WITH ANESTHESIA N/A 02/11/2016   Procedure: MRI OF THE BRAIN WITHOUT CONTRAST, LUMBAR WITHOUT CONTRAST;  Surgeon: Medication Radiologist, MD;  Location: [place];  Service: Radiology;  Laterality: N/A;  DR. WOOD/MRI  . Skin melanocytoma excision  2012   "above left clavicle"  . TEE WITHOUT CARDIOVERSION  03/22/2012   Procedure: TRANSESOPHAGEAL ECHOCARDIOGRAM (TEE);  Surgeon: Candee Furbish, MD;  Location: [facility] ENDOSCOPY;  Service: Cardiovascular;  Laterality: N/A;  . TEE WITHOUT CARDIOVERSION N/A 02/08/2016   Procedure: TRANSESOPHAGEAL ECHOCARDIOGRAM (TEE);  Surgeon: Lelon Perla, MD;  Location: [facility] ENDOSCOPY;  Service: Cardiovascular;  Laterality: N/A;    Current Medications: Outpatient Medications Prior  to Visit  Medication Sig Dispense Refill  . amiodarone (PACERONE) 400 MG tablet Take 1 tablet (400 mg total) by mouth daily. 30 tablet 1  . carvedilol (COREG) 25 MG tablet Take 1 tablet (25 mg total) by mouth 2 (two) times daily. 60 tablet 0  . diclofenac sodium (VOLTAREN) 1 % GEL Apply 2 g topically 4 (four) times daily. 1 Tube 1  .  docusate sodium (COLACE) 100 MG capsule Take 100 mg by mouth daily as needed for mild constipation.    . fluticasone (FLONASE) 50 MCG/ACT nasal spray Place 2 sprays into the nose as needed for allergies.     . furosemide (LASIX) 40 MG tablet Take 1 tablet (40 mg total) by mouth daily. 90 tablet 0  . isosorbide-hydrALAZINE (BIDIL) 20-37.5 MG tablet Take 0.5 tablets by mouth 3 (three) times daily. 90 tablet 0  . levothyroxine (SYNTHROID, LEVOTHROID) 75 MCG tablet Take 1 tablet (75 mcg total) by mouth daily before breakfast. 30 tablet 11  . magnesium oxide (MAG-OX) 400 MG tablet Take 1 tablet (400 mg total) by mouth daily. 30 tablet 1  . oxyCODONE-acetaminophen (PERCOCET/ROXICET) 5-325 MG tablet Take 1 tablet by mouth every 6 (six) hours as needed for moderate pain. 90 tablet 0  . gabapentin (NEURONTIN) 300 MG capsule Take 1 capsule (300 mg total) by mouth at bedtime. (Patient not taking: Reported on 04/24/2016) 30 capsule 0   No facility-administered medications prior to visit.      Allergies:   Lactose intolerance (gi)   Social History   Social History  . Marital status: Married    Spouse name: [name]  . Number of children: 1  . Years of education: N/A   Occupational History  .  [employer]   Social History Main Topics  . Smoking status: Never Smoker  . Smokeless tobacco: Never Used  . Alcohol use Yes     Comment: 02/13/12 "socially"  . Drug use: No  . Sexual activity: Not Currently   Other Topics Concern  . 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 He is married.. He has one son.     Family History:  The patient's family history includes Diabetes in his mother; Heart disease in his father and mother; Heart failure in his father, mother, and mother; Hypertension in his father and mother.   ROS:   Please see the history of present illness.    Apply for disability. 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 He is making good progress in rehabilitation. He continues to have back pain, right knee discomfort and swelling.  All other systems reviewed and are negative.   PHYSICAL EXAM:   VS:  BP (!) 142/80   Pulse 76   Ht 5\' 9"  (1.753 m)   Wt 219 lb (99.3 kg)   BMI 32.34 kg/m    GEN: Well nourished, well developed, in no acute distress  HEENT: normal  Neck: no JVD, carotid bruits, or masses Cardiac: RRR;, rubs, or gallops,no edema . Soft 2/6 decrescendo murmur of aortic regurgitation of the right upper sternal border. Respiratory:  clear to auscultation bilaterally, normal work of breathing GI: soft, nontender, nondistended, + BS MS: no deformity or atrophy  Skin: warm and dry, no rash Neuro:  Alert and Oriented x 3, Strength and sensation are intact Psych: euthymic mood, full affect  Wt Readings from Last 3 Encounters:  04/24/16 219  lb (99.3 kg)  03/20/16 226 lb (102.5 kg)  03/10/16 224 lb 13.9 oz (102 kg)      Studies/Labs Reviewed:   EKG:  EKG  When last repeated June 26 at 2017, he was in sinus rhythm.  Recent Labs: 02/05/2016: B Natriuretic Peptide 804.7 02/11/2016: TSH 2.861 02/18/2016: Magnesium 2.0 03/20/2016: ALT 15; BUN 18; Creat 1.56; Hemoglobin 10.0; Platelets 293; Potassium 4.1; Sodium 138   Lipid Panel    Component Value Date/Time   CHOL 161 02/13/2012 1829   TRIG 82 02/01/2016 0105   HDL 42 02/13/2012 1829   CHOLHDL 3.8 02/13/2012 1829   VLDL 30 02/13/2012 1829   LDLCALC 89 02/13/2012 1829    Additional studies/ records that were reviewed today include:  None    ASSESSMENT:    1. Chronic systolic heart failure ([place])   2. Left bundle branch block   3. Atypical atrial flutter ([place])   4. Hypertension, accelerated   5. Aortic insufficiency   6. Cardiac arrest ([place])   7. CKD (chronic kidney disease), stage IV ([place])   8. Long term (current) use of anticoagulants      PLAN:  In order of problems listed  above:  1. No evidence of volume overload. Continue same regimen. 2. Not addressed. 3. Was in normal sinus rhythm at the end of June. Clinically he still seems to be in sinus rhythm. He is on amiodarone 400 mg per day because of ventricular fibrillation cardiac arrest. He has been on that dose now for 2-1/2 months. I will decrease it to 200 mg per day. I will discuss this with CP. 4. Blood pressures under adequate control. 5. No significant change in auscultation. The murmur/degree of AR was considered to be mild. 6. No recurrence. Recovering well. No evidence of encephalopathy or difficulty with cognition. 7. Creatinine was 1.6 when last checked one month ago. 8. Resume eloquent's 5 mg twice a day because of history of recurrent atrial fibrillation. The reason is to decrease his embolic risk if he has recurrent atrial fibrillation/flutter. We will repeat EKG on return in 3 months.  ECG follow-up and office visit in 3 months.  Medication Adjustments/Labs and Tests Ordered: Current medicines are reviewed at length with the patient today.  Concerns regarding medicines are outlined above.  Medication changes, Labs and Tests ordered today are listed in the Patient Instructions below. There are no Patient Instructions on file for this visit.   Signed, Sinclair Grooms, MD  04/24/2016 9:44 AM    [facility] [place], [place], [place]  [address] Phone: [phone]; Fax: [phone]

## 2016-04-24 NOTE — Patient Instructions (Signed)
Medication Instructions:  Your physician has recommended you make the following change in your medication:  1) REDUCE Amiodarone to 200mg  daily 2) RESUME Eliquis 5mg  twice daily. An Rx has been sent to your pharmacy   Labwork: None ordered  Testing/Procedures: None ordered  Follow-Up: Your physician recommends that you schedule a follow-up appointment in: 3 months with an EKG   Any Other Special Instructions Will Be Listed Below (If Applicable).     If you need a refill on your cardiac medications before your next appointment, please call your pharmacy.

## 2016-04-26 ENCOUNTER — Ambulatory Visit: Payer: BC Managed Care – PPO | Attending: Internal Medicine | Admitting: Physical Therapy

## 2016-04-26 ENCOUNTER — Encounter: Payer: Self-pay | Admitting: Physical Therapy

## 2016-04-26 DIAGNOSIS — R2689 Other abnormalities of gait and mobility: Secondary | ICD-10-CM | POA: Insufficient documentation

## 2016-04-26 DIAGNOSIS — M25561 Pain in right knee: Secondary | ICD-10-CM | POA: Diagnosis present

## 2016-04-26 DIAGNOSIS — M6281 Muscle weakness (generalized): Secondary | ICD-10-CM

## 2016-04-26 DIAGNOSIS — M545 Low back pain, unspecified: Secondary | ICD-10-CM

## 2016-04-26 NOTE — Therapy (Signed)
[facility] [address], [place], [place], [address] Phone: [phone]   Fax:  [phone]  Physical Therapy Evaluation  Patient Details  Name: Eric Arostegui  MD MRN: [id number] Date of Birth: 1951/01/10 Referring Provider: Dr. Roe Coombs  Encounter Date: 04/26/2016      PT End of Session - 04/26/16 1719    Visit Number 1   Date for PT Re-Evaluation 08/26/16   Authorization Type BCBS   PT Start Time 1615   PT Stop Time 1705   PT Time Calculation (min) 50 min   Activity Tolerance Patient tolerated treatment well   Behavior During Therapy [facility] for tasks assessed/performed      Past Medical History:  Diagnosis Date  . CHF (congestive heart failure) ([place])   . Chronic kidney disease    kidney fx studies increased   . Chronic lower back pain   . Claustrophobia   . Dysrhythmia    "palpitations"  . Exertional dyspnea 01/2012  . Heart murmur   . Hypertension   . Hypothyroidism   . Migraine 02/13/12   "opthalmic"  . Varicose vein of leg    right    Past Surgical History:  Procedure Laterality Date  . CARDIAC CATHETERIZATION N/A 02/17/2016   Procedure: Left Heart Cath and Coronary Angiography;  Surgeon: Jettie Booze, MD;  Location: [facility];  Service: Cardiovascular;  Laterality: N/A;  . CARDIOVERSION  03/22/2012   Procedure: CARDIOVERSION;  Surgeon: Candee Furbish, MD;  Location: [facility] ENDOSCOPY;  Service: Cardiovascular;  Laterality: N/A;  . CARDIOVERSION  04/19/2012   Procedure: CARDIOVERSION;  Surgeon: Sinclair Grooms, MD;  Location: [facility];  Service: Cardiovascular;  Laterality: N/A;  . COLONOSCOPY N/A 04/11/2013   Procedure: COLONOSCOPY;  Surgeon: Inda Castle, MD;  Location: [facility] ENDOSCOPY;  Service: Endoscopy;  Laterality: N/A;  . COLONOSCOPY N/A 04/11/2013   Procedure: COLONOSCOPY;  Surgeon: Inda Castle, MD;  Location: [facility] ENDOSCOPY;  Service: Endoscopy;  Laterality: N/A;  . EP  IMPLANTABLE DEVICE N/A 02/17/2016   Procedure: BiV ICD Insertion CRT-D;  Surgeon: Evans Lance, MD;  Location: [facility];  Service: Cardiovascular;  Laterality: N/A;  . FINGER SURGERY  2012   "4th digit right hand; thumb on left hand"  . RADIOLOGY WITH ANESTHESIA N/A 02/11/2016   Procedure: MRI OF THE BRAIN WITHOUT CONTRAST, LUMBAR WITHOUT CONTRAST;  Surgeon: Medication Radiologist, MD;  Location: [place];  Service: Radiology;  Laterality: N/A;  DR. WOOD/MRI  . Skin melanocytoma excision  2012   "above left clavicle"  . TEE WITHOUT CARDIOVERSION  03/22/2012   Procedure: TRANSESOPHAGEAL ECHOCARDIOGRAM (TEE);  Surgeon: Candee Furbish, MD;  Location: [facility] ENDOSCOPY;  Service: Cardiovascular;  Laterality: N/A;  . TEE WITHOUT CARDIOVERSION N/A 02/08/2016   Procedure: TRANSESOPHAGEAL ECHOCARDIOGRAM (TEE);  Surgeon: Lelon Perla, MD;  Location: [facility] ENDOSCOPY;  Service: Cardiovascular;  Laterality: N/A;    There were no vitals filed for this visit.       Subjective Assessment - 04/26/16 1623    Subjective Patient was in the hospital from 01/31/2016 till 02/22/2016. Then in rehab from 02/22/2016 to 03/10/2016. Home health after rehab till 04/21/2016.  Continue therapy for weakness, balance and pain in right leg. Patient right quad does not work due to [employer].    Patient is accompained by: Family member   How long can you sit comfortably? sitting for 45 min and right leg gets numb   How long  can you stand comfortably? without walker 60 sec.    How long can you walk comfortably? walks with rolling waker and pressure in right knee   Patient Stated Goals regain previous mobility.  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 cooking, making bed, washing dishes    Currently in Pain? Yes   Pain Score 8   low 4/10   Pain Location Leg   Pain Orientation Right  medial aspect of right knee   Pain Descriptors / Indicators Numbness;Pressure;Pins and needles;Sharp;Tightness    Pain Type Acute pain   Pain Radiating Towards down right leg   Pain Onset More than a month ago   Pain Frequency Constant   Aggravating Factors  walking, pressure on right knee, standing, sitting   Pain Relieving Factors pain medication 1-2 times per day, pain gel on right knee 2-3 times per day, ice packs, movement   Multiple Pain Sites No            [facility] PT Assessment - 04/26/16 0001      Assessment   Medical Diagnosis R26.9 Abnormlities of gait   Referring Provider Dr. Roe Coombs   Onset Date/Surgical Date 01/31/16   Prior Therapy home health     Precautions   Precautions ICD/Pacemaker     Restrictions   Weight Bearing Restrictions No     Balance Screen   Has the patient fallen in the past 6 months No   Has the patient had a decrease in activity level because of a fear of falling?  Yes   Is the patient reluctant to leave their home because of a fear of falling?  Yes     Home Environment   Living Environment Private residence   Type of [place] Access Stairs to enter   Entrance Stairs-Number of Steps [place] Two level  master on main level   Alternate Level Stairs-Rails Left;Right     Prior Function   Level of Independence Independent with basic ADLs   Vocation Other (comment)   Vocation Requirements opthalmalogist  walk, stand, roll around on stool   Leisure helping in the house     Cognition   Overall Cognitive Status Within Functional Limits for tasks assessed     Observation/Other Assessments   Focus on Therapeutic Outcomes (FOTO)  Therapist discretion is 75% limitation due to quads not working and using a walker  goal is 25% limitation     Posture/Postural Control   Posture/Postural Control No significant limitations     AROM   AROM Assessment Site Knee   Right/Left Knee Right   Right Knee Extension -125  sitting   Right Knee Flexion 130     Strength   Overall Strength Comments abdominal strength is 2/5   Right/Left  Hip Right;Left   Right Hip Flexion 4/5   Right Hip Extension 4/5   Right Hip ABduction 3/5   Right Hip ADduction 5/5   Left Hip Flexion 4/5   Left Hip Extension 4/5   Left Hip ABduction 4/5   Left Hip ADduction 3+/5   Right Knee Flexion 4/5   Right Knee Extension 1/5     Ambulation/Gait   Ambulation/Gait Yes   Assistive device Rolling walker   Ambulation Surface Level   Stairs Yes   Stairs Assistance 6: Modified independent (Device/Increase time);5: Supervision   Stair Management Technique Step to pattern;Forwards;Two rails  PT Education - 04/26/16 1719    Education provided No          PT Short Term Goals - 04/26/16 1734      PT SHORT TERM GOAL #1   Title independent with initial HEP    Time 4   Period Weeks   Status New     PT SHORT TERM GOAL #2   Title be able to go to the gym to do the bike or elliptical for improving strength   Time 4   Period Weeks   Status New     PT SHORT TERM GOAL #3   Title right knee extension AROM in sitting -30 degress due to increase strength   Time 4   Period Weeks   Status New     PT SHORT TERM GOAL #4   Title pain in right leg decreased >/= 25%    Time 4   Period Weeks   Status New     PT SHORT TERM GOAL #5   Title able to get in and out of car without helping his right leg due to increased strength   Time 4   Period Weeks   Status New           PT Long Term Goals - 04/26/16 1652      PT LONG TERM GOAL #1   Title independent with HEP and understand how to progress himself   Time 8   Period Weeks   Status New     PT LONG TERM GOAL #2   Title able to stand for 25 min with pain decreased >/= 50% so he is able to cook a meal.   Time 8   Period Weeks   Status New     PT LONG TERM GOAL #3   Title going up and down stairs with step to step with a cane   Time 8   Period Weeks   Status New     PT LONG TERM GOAL #4   Title sit on stool and scoot around office to  assess patients   Time 8   Period Weeks   Status New     PT LONG TERM GOAL #5   Title return to household chores due to lower extremity strength >/= 4/5   Time 8   Period Weeks   Status New     Additional Long Term Goals   Additional Long Term Goals Yes     PT LONG TERM GOAL #6   Title return to driving due to increased right lower extremity strength >/= 4/5   Time 8   Period Weeks   Status New     PT LONG TERM GOAL #7   Title pain decreased >/= 50% in right knee   Time 8   Period Weeks   Status New               Plan - 04/26/16 1721    Clinical Impression Statement Patient is a 65 year old male with abnormal gait due to weakness in lower extremities.  Patient had cardiac arrest in 01/29/2016.  He was hospitalized from 5/6/017 to 02/22/2016 then went into inpatient rehab till 03/12/2016.  Patient then had home health.  Patient weakness is contributed to bilaterla retroperitoneal hemorrhage with femoral nerve involvement. Patient reports right leg and back pain at level 8/10 intemittently.  Pain is worse with walking, sitting, standing.  Patient is unable to work, drive, perform his daily home activities  due to pain and weakness.  Right quad strength is 1/5 and only able to extend right knee actively to 55 degrees.  Patient ambulates with a rolling walker.  He can go up and down stairs with step to step pattern for 4 steps going into his home using handrails.  Patient has a family member that is with them to assist patient with daily activities.  Patient has a fear of falling due to weakness and pain in right lower extremity.  Patient is of moderate complexity.  Patient will benefit from physical therapy to improve strength for walking and returning to work and reduce pain.    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   PT Frequency 3x / week   PT Duration Other (comment)  4 months    PT Treatment/Interventions ADLs/Self Care Home Management;Cryotherapy;Electrical Stimulation;Functional mobility training;Stair training;Gait training;Ultrasound;Moist Heat;Therapeutic activities;Therapeutic exercise;Balance training;Neuromuscular re-education;Patient/family education;Passive range of motion;Manual techniques;Energy conservation;Other (comment)  home TENS wtih NMES   PT Next Visit Plan abdominal bracing, nustep, NMES to right quads with exercise, modalities for pain, bil. hip abd/add and flexion strength, Balance ex   PT Home Exercise Plan abdominal bracing   Recommended Other Services home NMES with TENS to stimulate the right quads and to reduce pain   Consulted and Agree with Plan of Care Patient      Patient will benefit from skilled therapeutic intervention in order to improve the following deficits and impairments:  Abnormal gait, Difficulty walking, Decreased endurance, Pain, Decreased activity tolerance, Decreased balance, Decreased strength, Decreased mobility  Visit Diagnosis: Muscle weakness (generalized) - Plan: PT plan of care cert/re-cert  Other abnormalities of gait and mobility - Plan: PT plan of care cert/re-cert  Pain in right knee - Plan: PT plan of care cert/re-cert  Left-sided low back pain without sciatica - Plan: PT plan of care cert/re-cert     Problem List Patient Active Problem List   Diagnosis Date Noted  . Right ankle pain   . Pain   . Tibial pain   . Acute idiopathic gout of right ankle   . Right knee pain   . Diabetes mellitus type 2 in obese ([place])   . Labile blood pressure   . Acute blood loss anemia   . Diabetes mellitus type 2 in nonobese ([facility])   . Debility 02/22/2016  . 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 Weakness of both lower extremities   . Lower extremity weakness   . Pneumonia   . Acute lumbar back pain   . Non-traumatic rhabdomyolysis   . Retroperitoneal bleed   . Back pain   . Leg weakness,  bilateral   . Paroxysmal atrial fibrillation ([place])   . Ventricular fibrillation ([place]) 02/09/2016  . Aortic aneurysm without rupture ([place]) 02/09/2016  . Aortic insufficiency 02/09/2016  . HCAP (healthcare-associated pneumonia)   . Cardiac arrest ([place]) 02/01/2016  . Acute on chronic systolic heart failure ([place])   . Acute encephalopathy   . Thyroid activity decreased   . Acute hypoxemic respiratory failure ([place])   . Acute respiratory failure ([place])   . Encounter for central line placement   . Arrhythmia   . Altered mental status   . CKD (chronic kidney disease), stage IV ([place]) 08/06/2013    Class: Chronic  . Long term (current) use of anticoagulants 08/06/2013    Class: Chronic  . Special screening for malignant neoplasms, colon 04/11/2013  .  Colon cancer screening 03/04/2013  . Chronic anticoagulation 03/04/2013  . Atrial flutter ([place]) 03/18/2012    Class: Acute  . Chronic systolic heart failure ([place]) 02/13/2012    Class: Acute  . Hypertension, accelerated 02/13/2012  . Aortic valve regurgitation, acquired 02/13/2012  . Aortic root enlargement ([place]) 02/13/2012  . Hyperlipidemia 02/13/2012  . Left bundle branch block 02/13/2012  . Obesity (BMI 30-39.9) 02/13/2012    Earlie Counts, PT 04/26/16 5:43 PM   [facility] [address], [place], [place], [address] Phone: [phone]   Fax:  [phone]  Name: Eric Denger  MD MRN: [id number] Date of Birth: [birth year]

## 2016-05-02 ENCOUNTER — Ambulatory Visit: Payer: BC Managed Care – PPO | Admitting: Physical Therapy

## 2016-05-02 DIAGNOSIS — M6281 Muscle weakness (generalized): Secondary | ICD-10-CM | POA: Diagnosis not present

## 2016-05-02 DIAGNOSIS — M545 Low back pain, unspecified: Secondary | ICD-10-CM

## 2016-05-02 DIAGNOSIS — R2689 Other abnormalities of gait and mobility: Secondary | ICD-10-CM

## 2016-05-02 DIAGNOSIS — M25561 Pain in right knee: Secondary | ICD-10-CM

## 2016-05-02 NOTE — Patient Instructions (Signed)
  Eric [name] PT [facility] [address], [place], [place] [address] Phone # [phone] Fax [phone]  Abduction: Clam (Eccentric) - Side-Lying   Lie on side with knees bent. Lift top knee, keeping feet together. Keep trunk steady. Slowly lower for 3-5 seconds. __10_ reps per set, _1__ sets per day, __7_ days per week. Add red band.     Leg Extension (Hamstring)   Sit toward front edge of chair, with leg out straight, heel on floor, toes pointing toward body. Keeping back straight, bend forward at hip, breathing out through pursed lips. Return, breathing in. Repeat __3_ times. Repeat with other leg. Do _2__ sessions per day.  Hold 30 sec Variation: Perform from standing position, with support.

## 2016-05-02 NOTE — Therapy (Signed)
[facility] [address] [place], [place], [address] Phone: [phone]   Fax:  [phone]  Physical Therapy Treatment  Patient Details  Name: Eric Deperalta  MD MRN: [id number] Date of Birth: 1951-08-04 Referring Provider: Dr. Roe Coombs  Encounter Date: 05/02/2016      PT End of Session - 05/02/16 1717    Visit Number 2   Date for PT Re-Evaluation 08/26/16   Authorization Type BCBS   PT Start Time 0815   PT Stop Time 0845   PT Time Calculation (min) 30 min   Activity Tolerance Patient limited by fatigue;Patient tolerated treatment well      Past Medical History:  Diagnosis Date  . CHF (congestive heart failure) ([place])   . Chronic kidney disease    kidney fx studies increased   . Chronic lower back pain   . Claustrophobia   . Dysrhythmia    "palpitations"  . Exertional dyspnea 01/2012  . Heart murmur   . Hypertension   . Hypothyroidism   . Migraine 02/13/12   "opthalmic"  . Varicose vein of leg    right    Past Surgical History:  Procedure Laterality Date  . CARDIAC CATHETERIZATION N/A 02/17/2016   Procedure: Left Heart Cath and Coronary Angiography;  Surgeon: Jettie Booze, MD;  Location: [place] [facility];  Service: Cardiovascular;  Laterality: N/A;  . CARDIOVERSION  03/22/2012   Procedure: CARDIOVERSION;  Surgeon: Candee Furbish, MD;  Location: [facility] ENDOSCOPY;  Service: Cardiovascular;  Laterality: N/A;  . CARDIOVERSION  04/19/2012   Procedure: CARDIOVERSION;  Surgeon: Sinclair Grooms, MD;  Location: [place];  Service: Cardiovascular;  Laterality: N/A;  . COLONOSCOPY N/A 04/11/2013   Procedure: COLONOSCOPY;  Surgeon: Inda Castle, MD;  Location: [facility] ENDOSCOPY;  Service: Endoscopy;  Laterality: N/A;  . COLONOSCOPY N/A 04/11/2013   Procedure: COLONOSCOPY;  Surgeon: Inda Castle, MD;  Location: [facility] ENDOSCOPY;  Service: Endoscopy;  Laterality: N/A;  . EP IMPLANTABLE DEVICE N/A 02/17/2016    Procedure: BiV ICD Insertion CRT-D;  Surgeon: Evans Lance, MD;  Location: [place] [facility];  Service: Cardiovascular;  Laterality: N/A;  . FINGER SURGERY  2012   "4th digit right hand; thumb on left hand"  . RADIOLOGY WITH ANESTHESIA N/A 02/11/2016   Procedure: MRI OF THE BRAIN WITHOUT CONTRAST, LUMBAR WITHOUT CONTRAST;  Surgeon: Medication Radiologist, MD;  Location: [place];  Service: Radiology;  Laterality: N/A;  DR. WOOD/MRI  . Skin melanocytoma excision  2012   "above left clavicle"  . TEE WITHOUT CARDIOVERSION  03/22/2012   Procedure: TRANSESOPHAGEAL ECHOCARDIOGRAM (TEE);  Surgeon: Candee Furbish, MD;  Location: [facility] ENDOSCOPY;  Service: Cardiovascular;  Laterality: N/A;  . TEE WITHOUT CARDIOVERSION N/A 02/08/2016   Procedure: TRANSESOPHAGEAL ECHOCARDIOGRAM (TEE);  Surgeon: Lelon Perla, MD;  Location: [facility] ENDOSCOPY;  Service: Cardiovascular;  Laterality: N/A;    There were no vitals filed for this visit.      Subjective Assessment - 05/02/16 0816    Subjective Patient arrives 15 min late.  complains of his knee stiffening.  Knee swelling is persistent.    Using RW.   Currently in Pain? Yes   Pain Score 3    Pain Location Leg   Pain Orientation Right   Pain Type Acute pain                         [facility] Adult PT Treatment/Exercise - 05/02/16 0001  Knee/Hip Exercises: Stretches   Active Hamstring Stretch Right;Left;2 reps;30 seconds   Active Hamstring Stretch Limitations seated with foot on stool     Knee/Hip Exercises: Aerobic   Nustep 10 min L1 seat 11     Knee/Hip Exercises: Standing   Other Standing Knee Exercises retro stepping holding to counter 10x     Knee/Hip Exercises: Seated   Ball Squeeze 10x  with quad set in sitting   Other Seated Knee/Hip Exercises ankle pump with every other knee extension for edema control 10x   Other Seated Knee/Hip Exercises quad set in sitting 10x     Knee/Hip Exercises: Sidelying   Clams red band 15x right/left                 PT Education - 05/02/16 1716    Education provided Yes   Education Details HS stretch; clams; retro stepping for quad activation   Person(s) Educated Patient   Methods Explanation;Demonstration;Handout   Comprehension Verbalized understanding;Returned demonstration          PT Short Term Goals - 05/02/16 1721      PT SHORT TERM GOAL #1   Title independent with initial HEP    Time 4   Period Weeks   Status On-going     PT SHORT TERM GOAL #2   Title be able to go to the gym to do the bike or elliptical for improving strength   Time 4   Period Weeks   Status On-going     PT SHORT TERM GOAL #3   Title right knee extension AROM in sitting -30 degress due to increase strength   Time 4   Period Weeks   Status On-going     PT SHORT TERM GOAL #4   Title pain in right leg decreased >/= 25%    Time 4   Period Weeks   Status On-going     PT SHORT TERM GOAL #5   Title able to get in and out of car without helping his right leg due to increased strength   Time 4   Period Weeks   Status On-going           PT Long Term Goals - 05/02/16 1721      PT LONG TERM GOAL #1   Title independent with HEP and understand how to progress himself   Time 8   Period Weeks   Status On-going     PT LONG TERM GOAL #2   Title able to stand for 25 min with pain decreased >/= 50% so he is able to cook a meal.   Period Weeks     PT LONG TERM GOAL #3   Title going up and down stairs with step to step with a cane   Time 8   Period Weeks   Status On-going     PT LONG TERM GOAL #4   Title sit on stool and scoot around office to assess patients   Time 8   Period Weeks   Status On-going     PT LONG TERM GOAL #5   Title return to household chores due to lower extremity strength >/= 4/5   Time 8   Period Weeks   Status On-going     PT LONG TERM GOAL #6   Title return to driving due to increased right lower extremity strength >/= 4/5   Time 8   Period Weeks    Status On-going     PT LONG TERM  GOAL #7   Title pain decreased >/= 50% in right knee   Time 8   Period Weeks   Status On-going               Plan - 05/02/16 1718    Clinical Impression Statement The patient arrives 15 min late.  He reports no changes following initial eval.  Discussed literature on use of NMES for quad activation when a cardiac implant is present.  Patient will discuss with cardiologist.  The patient is able to participate in low level hip and knee exercises but experiences muscle and general body fatigue quickly.  Therapist closely monitoring response with throughout treatment session.     PT Next Visit Plan abdominal bracing, nustep, if cardiologist approves then  NMES to right quads with exercise and would benefit from home unit, modalities for pain, bil. hip abd/add and flexion strength, Balance ex      Patient will benefit from skilled therapeutic intervention in order to improve the following deficits and impairments:     Visit Diagnosis: Muscle weakness (generalized)  Other abnormalities of gait and mobility  Pain in right knee  Left-sided low back pain without sciatica     Problem List Patient Active Problem List   Diagnosis Date Noted  . Right ankle pain   . Pain   . Tibial pain   . Acute idiopathic gout of right ankle   . Right knee pain   . Diabetes mellitus type 2 in obese ([place])   . Labile blood pressure   . Acute blood loss anemia   . Diabetes mellitus type 2 in nonobese ([facility])   . Debility 02/22/2016  . 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 Weakness of both lower extremities   . Lower extremity weakness   . Pneumonia   . Acute lumbar back pain   . Non-traumatic rhabdomyolysis   . Retroperitoneal bleed   . Back pain   . Leg weakness, bilateral   . Paroxysmal atrial fibrillation ([place])   . Ventricular fibrillation ([place]) 02/09/2016  . Aortic aneurysm without rupture ([place]) 02/09/2016  . Aortic  insufficiency 02/09/2016  . HCAP (healthcare-associated pneumonia)   . Cardiac arrest ([place]) 02/01/2016  . Acute on chronic systolic heart failure ([place])   . Acute encephalopathy   . Thyroid activity decreased   . Acute hypoxemic respiratory failure ([place])   . Acute respiratory failure ([place])   . Encounter for central line placement   . Arrhythmia   . Altered mental status   . CKD (chronic kidney disease), stage IV ([place]) 08/06/2013    Class: Chronic  . Long term (current) use of anticoagulants 08/06/2013    Class: Chronic  . Special screening for malignant neoplasms, colon 04/11/2013  . Colon cancer screening 03/04/2013  . Chronic anticoagulation 03/04/2013  . Atrial flutter ([place]) 03/18/2012    Class: Acute  . Chronic systolic heart failure ([place]) 02/13/2012    Class: Acute  . Hypertension, accelerated 02/13/2012  . Aortic valve regurgitation, acquired 02/13/2012  . Aortic root enlargement ([place]) 02/13/2012  . Hyperlipidemia 02/13/2012  . Left bundle branch block 02/13/2012  . Obesity (BMI 30-39.9) 02/13/2012   Ruben Im, PT 05/02/16 5:24 PM Phone: [phone] Fax: [phone]  Alvera Singh 05/02/2016, 5:23 PM  [facility] [address], [place], [place], [address] Phone: [phone]   Fax:  [phone]  Name: Seamon Brueggemann  MD MRN: [id number] Date of Birth: 16-Jun-1951

## 2016-05-03 ENCOUNTER — Ambulatory Visit: Payer: BC Managed Care – PPO | Admitting: Physical Therapy

## 2016-05-03 ENCOUNTER — Encounter: Payer: Self-pay | Admitting: Physical Therapy

## 2016-05-03 DIAGNOSIS — M25561 Pain in right knee: Secondary | ICD-10-CM

## 2016-05-03 DIAGNOSIS — M545 Low back pain, unspecified: Secondary | ICD-10-CM

## 2016-05-03 DIAGNOSIS — R2689 Other abnormalities of gait and mobility: Secondary | ICD-10-CM

## 2016-05-03 DIAGNOSIS — M6281 Muscle weakness (generalized): Secondary | ICD-10-CM

## 2016-05-03 NOTE — Therapy (Signed)
[facility] [address] [place], [place], [address] Phone: [phone]   Fax:  [phone]  Physical Therapy Treatment  Patient Details  Name: Eric Bonczek  MD MRN: [id number] Date of Birth: May 05, 1951 Referring Provider: Dr. Roe Coombs  Encounter Date: 05/03/2016      PT End of Session - 05/03/16 1233    Visit Number 3   Date for PT Re-Evaluation 08/26/16   Authorization Type BCBS   PT Start Time 1145   PT Stop Time 1236   PT Time Calculation (min) 51 min   Activity Tolerance Patient tolerated treatment well   Behavior During Therapy [facility] for tasks assessed/performed      Past Medical History:  Diagnosis Date  . CHF (congestive heart failure) ([place])   . Chronic kidney disease    kidney fx studies increased   . Chronic lower back pain   . Claustrophobia   . Dysrhythmia    "palpitations"  . Exertional dyspnea 01/2012  . Heart murmur   . Hypertension   . Hypothyroidism   . Migraine 02/13/12   "opthalmic"  . Varicose vein of leg    right    Past Surgical History:  Procedure Laterality Date  . CARDIAC CATHETERIZATION N/A 02/17/2016   Procedure: Left Heart Cath and Coronary Angiography;  Surgeon: Jettie Booze, MD;  Location: [place] [facility];  Service: Cardiovascular;  Laterality: N/A;  . CARDIOVERSION  03/22/2012   Procedure: CARDIOVERSION;  Surgeon: Candee Furbish, MD;  Location: [facility] ENDOSCOPY;  Service: Cardiovascular;  Laterality: N/A;  . CARDIOVERSION  04/19/2012   Procedure: CARDIOVERSION;  Surgeon: Sinclair Grooms, MD;  Location: [place];  Service: Cardiovascular;  Laterality: N/A;  . COLONOSCOPY N/A 04/11/2013   Procedure: COLONOSCOPY;  Surgeon: Inda Castle, MD;  [facility]OS[facility];  Service: Endoscopy;  Laterality: N/A;  . COLONOSCOPY N/A 04/11/2013   Procedure: COLONOSCOPY;  Surgeon: Inda Castle, MD;  Location: [facility] ENDOSCOPY;  Service: Endoscopy;  Laterality: N/A;  . EP  IMPLANTABLE DEVICE N/A 02/17/2016   Procedure: BiV ICD Insertion CRT-D;  Surgeon: Evans Lance, MD;  Location: [place] [facility];  Service: Cardiovascular;  Laterality: N/A;  . FINGER SURGERY  2012   "4th digit right hand; thumb on left hand"  . RADIOLOGY WITH ANESTHESIA N/A 02/11/2016   Procedure: MRI OF THE BRAIN WITHOUT CONTRAST, LUMBAR WITHOUT CONTRAST;  Surgeon: Medication Radiologist, MD;  Location: [place];  Service: Radiology;  Laterality: N/A;  DR. WOOD/MRI  . Skin melanocytoma excision  2012   "above left clavicle"  . TEE WITHOUT CARDIOVERSION  03/22/2012   Procedure: TRANSESOPHAGEAL ECHOCARDIOGRAM (TEE);  Surgeon: Candee Furbish, MD;  Location: [facility] ENDOSCOPY;  Service: Cardiovascular;  Laterality: N/A;  . TEE WITHOUT CARDIOVERSION N/A 02/08/2016   Procedure: TRANSESOPHAGEAL ECHOCARDIOGRAM (TEE);  Surgeon: Lelon Perla, MD;  Location: [facility] ENDOSCOPY;  Service: Cardiovascular;  Laterality: N/A;    There were no vitals filed for this visit.      Subjective Assessment - 05/03/16 1150    Subjective I talked to my doctor and he feels the NMES unit can only be used in a setting where the difirillator can be turned off. So this is not appropriate at this time. No pain just stiffness. Easier to stand without walker.    How long can you sit comfortably? sitting for 45 min and right leg gets numb   How long can you stand comfortably? without walker 60 sec.  How long can you walk comfortably? walks with rolling waker and pressure in right knee   Patient Stated Goals regain previous mobility.  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 cooking, making bed, washing dishes    Currently in Pain? No/denies                         [facility] Adult PT Treatment/Exercise - 05/03/16 0001      Knee/Hip Exercises: Aerobic   Nustep level 1, seat #11, no arms, 10 min     Knee/Hip Exercises: Standing   Other Standing Knee Exercises sit to stand with 2 cushions on mat 5x  then hover over cushios 5 sec 10x     Knee/Hip Exercises: Seated   Ball Squeeze 10x  with quad set in sitting   Other Seated Knee/Hip Exercises ankle pump 10x hold 10 sec keeping equal   Abduction/Adduction  Right;Left;Strengthening;10 reps  long sit, leading with heel     Knee/Hip Exercises: Supine   Quad Sets Strengthening;Right;10 reps  hold 5 sec, long sit   Quad Sets Limitations tap the quads to facilitate muscle   Heel Slides Strengthening;Right;1 set;10 reps;Other (comment)   Heel Slides Limitations 1.5 # slight assistance to initiate   Bridges with [employer] Strengthening;Both;1 set;10 reps  tactile cues to contract right gluteal   Straight Leg Raises Strengthening;Right;1 set;10 reps   Straight Leg Raises Limitations needed 50% assistance                PT Education - 05/03/16 1225    Education provided Yes   Education Details quad sets, hip abduction in long sitting sliding out to side   Person(s) Educated Patient   Methods Explanation;Demonstration   Comprehension Verbalized understanding;Returned demonstration          PT Short Term Goals - 05/02/16 1721      PT SHORT TERM GOAL #1   Title independent with initial HEP    Time 4   Period Weeks   Status On-going     PT SHORT TERM GOAL #2   Title be able to go to the gym to do the bike or elliptical for improving strength   Time 4   Period Weeks   Status On-going     PT SHORT TERM GOAL #3   Title right knee extension AROM in sitting -30 degress due to increase strength   Time 4   Period Weeks   Status On-going     PT SHORT TERM GOAL #4   Title pain in right leg decreased >/= 25%    Time 4   Period Weeks   Status On-going     PT SHORT TERM GOAL #5   Title able to get in and out of car without helping his right leg due to increased strength   Time 4   Period Weeks   Status On-going           PT Long Term Goals - 05/02/16 1721      PT LONG TERM GOAL #1   Title independent with  HEP and understand how to progress himself   Time 8   Period Weeks   Status On-going     PT LONG TERM GOAL #2   Title able to stand for 25 min with pain decreased >/= 50% so he is able to cook a meal.   Period Weeks     PT LONG TERM GOAL #3  Title going up and down stairs with step to step with a cane   Time 8   Period Weeks   Status On-going     PT LONG TERM GOAL #4   Title sit on stool and scoot around office to assess patients   Time 8   Period Weeks   Status On-going     PT LONG TERM GOAL #5   Title return to household chores due to lower extremity strength >/= 4/5   Time 8   Period Weeks   Status On-going     PT LONG TERM GOAL #6   Title return to driving due to increased right lower extremity strength >/= 4/5   Time 8   Period Weeks   Status On-going     PT LONG TERM GOAL #7   Title pain decreased >/= 50% in right knee   Time 8   Period Weeks   Status On-going               Plan - 05/03/16 1226    Clinical Impression Statement Patient able to contract his right quads more with tapping.  Patient needs verbal cues to place weight onto right leg. Patient needs verbal cues to perform exercises ot do corectly.  Patient will benefit form physical therapy to increase strength and blance for gait.    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   PT Frequency 3x / week   PT Duration Other (comment)  4 months   PT Treatment/Interventions ADLs/Self Care Home Management;Cryotherapy;Electrical Stimulation;Functional mobility training;Stair training;Gait training;Ultrasound;Moist Heat;Therapeutic activities;Therapeutic exercise;Balance training;Neuromuscular re-education;Patient/family education;Passive range of motion;Manual techniques;Energy conservation;Other (comment)   PT Next Visit Plan abdominal bracing, nustep o arms, , modalities for pain, bil. hip abd/add and  flexion strength, Balance ex   PT Home Exercise Plan abdominal bracing   Consulted and Agree with Plan of Care Patient      Patient will benefit from skilled therapeutic intervention in order to improve the following deficits and impairments:  Abnormal gait, Difficulty walking, Decreased endurance, Pain, Decreased activity tolerance, Decreased balance, Decreased strength, Decreased mobility  Visit Diagnosis: Muscle weakness (generalized)  Other abnormalities of gait and mobility  Pain in right knee  Left-sided low back pain without sciatica     Problem List Patient Active Problem List   Diagnosis Date Noted  . Right ankle pain   . Pain   . Tibial pain   . Acute idiopathic gout of right ankle   . Right knee pain   . Diabetes mellitus type 2 in obese ([place])   . Labile blood pressure   . Acute blood loss anemia   . Diabetes mellitus type 2 in nonobese ([facility])   . Debility 02/22/2016  . 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 Weakness of both lower extremities   . Lower extremity weakness   . Pneumonia   . Acute lumbar back pain   . Non-traumatic rhabdomyolysis   . Retroperitoneal bleed   . Back pain   . Leg weakness, bilateral   . Paroxysmal atrial fibrillation ([place])   . Ventricular fibrillation ([place]) 02/09/2016  . Aortic aneurysm without rupture ([place]) 02/09/2016  . Aortic insufficiency 02/09/2016  . HCAP (healthcare-associated pneumonia)   . Cardiac arrest ([place]) 02/01/2016  . Acute on chronic systolic heart failure ([place])   . Acute encephalopathy   . Thyroid activity decreased   . Acute hypoxemic respiratory failure (  [place])   . Acute respiratory failure ([place])   . Encounter for central line placement   . Arrhythmia   . Altered mental status   . CKD (chronic kidney disease), stage IV ([place]) 08/06/2013    Class: Chronic  . Long term (current) use of anticoagulants 08/06/2013    Class: Chronic  . Special screening for malignant neoplasms, colon  04/11/2013  . Colon cancer screening 03/04/2013  . Chronic anticoagulation 03/04/2013  . Atrial flutter ([place]) 03/18/2012    Class: Acute  . Chronic systolic heart failure ([place]) 02/13/2012    Class: Acute  . Hypertension, accelerated 02/13/2012  . Aortic valve regurgitation, acquired 02/13/2012  . Aortic root enlargement ([place]) 02/13/2012  . Hyperlipidemia 02/13/2012  . Left bundle branch block 02/13/2012  . Obesity (BMI 30-39.9) 02/13/2012    GRAY,CHERYL 05/03/2016, 12:33 PM  [facility] [address] [place], [place], [address] Phone: [phone]   Fax:  [phone]  Name: Eric Betzen  MD MRN: [id number] Date of Birth: Jun 24, 1951

## 2016-05-08 ENCOUNTER — Ambulatory Visit: Payer: BC Managed Care – PPO | Admitting: Physical Therapy

## 2016-05-08 ENCOUNTER — Encounter: Payer: Self-pay | Admitting: Physical Therapy

## 2016-05-08 DIAGNOSIS — M6281 Muscle weakness (generalized): Secondary | ICD-10-CM

## 2016-05-08 DIAGNOSIS — R2689 Other abnormalities of gait and mobility: Secondary | ICD-10-CM

## 2016-05-08 NOTE — Therapy (Signed)
[facility] [address] [place], [place], [address] Phone: [phone]   Fax:  [phone]  Physical Therapy Treatment  Patient Details  Name: Eric Hink  MD MRN: [id number] Date of Birth: 1951/05/28 Referring Provider: Dr. Roe Coombs  Encounter Date: 05/08/2016      PT End of Session - 05/08/16 1443    Visit Number 4   Date for PT Re-Evaluation 08/26/16   Authorization Type BCBS   PT Start Time 1400   PT Stop Time 1443   PT Time Calculation (min) 43 min   Activity Tolerance Patient tolerated treatment well   Behavior During Therapy [facility] for tasks assessed/performed      Past Medical History:  Diagnosis Date  . CHF (congestive heart failure) ([place])   . Chronic kidney disease    kidney fx studies increased   . Chronic lower back pain   . Claustrophobia   . Dysrhythmia    "palpitations"  . Exertional dyspnea 01/2012  . Heart murmur   . Hypertension   . Hypothyroidism   . Migraine 02/13/12   "opthalmic"  . Varicose vein of leg    right    Past Surgical History:  Procedure Laterality Date  . CARDIAC CATHETERIZATION N/A 02/17/2016   Procedure: Left Heart Cath and Coronary Angiography;  Surgeon: Jettie Booze, MD;  Location: [place] [facility];  Service: Cardiovascular;  Laterality: N/A;  . CARDIOVERSION  03/22/2012   Procedure: CARDIOVERSION;  Surgeon: Candee Furbish, MD;  Location: [facility] ENDOSCOPY;  Service: Cardiovascular;  Laterality: N/A;  . CARDIOVERSION  04/19/2012   Procedure: CARDIOVERSION;  Surgeon: Sinclair Grooms, MD;  Location: [place];  Service: Cardiovascular;  Laterality: N/A;  . COLONOSCOPY N/A 04/11/2013   Procedure: COLONOSCOPY;  Surgeon: Inda Castle, MD;  Location: [facility] ENDOSCOPY;  Service: Endoscopy;  Laterality: N/A;  . COLONOSCOPY N/A 04/11/2013   Procedure: COLONOSCOPY;  Surgeon: Inda Castle, MD;  Location: [facility] ENDOSCOPY;  Service: Endoscopy;  Laterality: N/A;  . EP  IMPLANTABLE DEVICE N/A 02/17/2016   Procedure: BiV ICD Insertion CRT-D;  Surgeon: Evans Lance, MD;  Location: [place] [facility];  Service: Cardiovascular;  Laterality: N/A;  . FINGER SURGERY  2012   "4th digit right hand; thumb on left hand"  . RADIOLOGY WITH ANESTHESIA N/A 02/11/2016   Procedure: MRI OF THE BRAIN WITHOUT CONTRAST, LUMBAR WITHOUT CONTRAST;  Surgeon: Medication Radiologist, MD;  Location: [place];  Service: Radiology;  Laterality: N/A;  DR. WOOD/MRI  . Skin melanocytoma excision  2012   "above left clavicle"  . TEE WITHOUT CARDIOVERSION  03/22/2012   Procedure: TRANSESOPHAGEAL ECHOCARDIOGRAM (TEE);  Surgeon: Candee Furbish, MD;  Location: [facility] ENDOSCOPY;  Service: Cardiovascular;  Laterality: N/A;  . TEE WITHOUT CARDIOVERSION N/A 02/08/2016   Procedure: TRANSESOPHAGEAL ECHOCARDIOGRAM (TEE);  Surgeon: Lelon Perla, MD;  Location: [facility] ENDOSCOPY;  Service: Cardiovascular;  Laterality: N/A;    There were no vitals filed for this visit.      Subjective Assessment - 05/08/16 1410    Subjective I was not tired after last visit.  My muscles were sore after the visit.    Patient is accompained by: Family member  family friend   How long can you sit comfortably? sitting for 45 min and right leg gets numb   How long can you stand comfortably? without walker 60 sec.    How long can you walk comfortably? walks with rolling waker and pressure in right knee  Patient Stated Goals regain previous mobility.  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 cooking, making bed, washing dishes    Currently in Pain? Yes   Pain Score 4    Pain Location Leg   Pain Orientation Right   Pain Descriptors / Indicators Tightness;Dull   Pain Type Acute pain   Pain Onset More than a month ago   Pain Frequency Intermittent   Aggravating Factors  stationary positioned, sitting   Pain Relieving Factors pain medication   Multiple Pain Sites No            [facility] PT Assessment -  05/08/16 0001      AROM   Right Knee Extension -120                     [facility] Adult PT Treatment/Exercise - 05/08/16 0001      Knee/Hip Exercises: Aerobic   Nustep level 1, seat #11, no arms, 10 min     Knee/Hip Exercises: Seated   Sit to Sand 10 reps;without UE support;Other (comment)  cushion on mat; tactle cues to place wt. on right     Knee/Hip Exercises: Supine   Quad Sets Strengthening;Right;10 reps  hold 5 sec, long sit   Quad Sets Limitations tap quads to faciliate muscles, press into BP cuff to see rise with pressure   Heel Slides Strengthening;1 set;10 reps;Other (comment);Right   Heel Slides Limitations 10x no weight, 10x 1# wt.   increased control   Bridges with [employer] Strengthening;Both;1 set;20 reps  tactile cues to contract right gluteal   Straight Leg Raises Strengthening;Right;1 set;15 reps   Straight Leg Raises Limitations needed 50% assistance; able to contract abdominals   Other Supine Knee/Hip Exercises hip abduction 10x2 leading with heel right, left 2x10 more difficult                PT Education - 05/08/16 1443    Education provided No          PT Short Term Goals - 05/08/16 1446      PT SHORT TERM GOAL #1   Title independent with initial HEP    Time 4   Period Weeks   Status On-going     PT SHORT TERM GOAL #2   Title be able to go to the gym to do the bike or elliptical for improving strength   Time 4   Period Weeks   Status On-going     PT SHORT TERM GOAL #3   Title right knee extension AROM in sitting -30 degress due to increase strength   Time 4   Period Weeks   Status On-going     PT SHORT TERM GOAL #4   Title pain in right leg decreased >/= 25%    Time 4   Period Weeks   Status On-going     PT SHORT TERM GOAL #5   Title able to get in and out of car without helping his right leg due to increased strength   Time 4   Period Weeks   Status On-going           PT Long Term Goals - 05/02/16  1721      PT LONG TERM GOAL #1   Title independent with HEP and understand how to progress himself   Time 8   Period Weeks   Status On-going     PT LONG TERM GOAL #2   Title able to  stand for 25 min with pain decreased >/= 50% so he is able to cook a meal.   Period Weeks     PT LONG TERM GOAL #3   Title going up and down stairs with step to step with a cane   Time 8   Period Weeks   Status On-going     PT LONG TERM GOAL #4   Title sit on stool and scoot around office to assess patients   Time 8   Period Weeks   Status On-going     PT LONG TERM GOAL #5   Title return to household chores due to lower extremity strength >/= 4/5   Time 8   Period Weeks   Status On-going     PT LONG TERM GOAL #6   Title return to driving due to increased right lower extremity strength >/= 4/5   Time 8   Period Weeks   Status On-going     PT LONG TERM GOAL #7   Title pain decreased >/= 50% in right knee   Time 8   Period Weeks   Status On-going               Plan - 05/08/16 1443    Clinical Impression Statement Patient is able to extend his right  knee 5 degrees more against gravity.  Patient able to keep his hips leveled with bridges due to increased control.  During heel slides on rigth he is able to glide without foot on mat.  Patient needs tactle cues to place weight on right leg when going from sit to stand. Patient has not met goais yet but has made progress.  Patient will benefit from physical therapy to increase strength, balance and mobility.    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   PT Frequency 3x / week   PT Duration Other (comment)   PT Treatment/Interventions ADLs/Self Care Home Management;Cryotherapy;Electrical Stimulation;Functional mobility training;Stair training;Gait training;Ultrasound;Moist Heat;Therapeutic activities;Therapeutic exercise;Balance  training;Neuromuscular re-education;Patient/family education;Passive range of motion;Manual techniques;Energy conservation;Other (comment)   PT Next Visit Plan abdominal bracing with exercise, nustep no arms, , modalities for pain, bil. hip abd/add and flexion strength, Balance ex   PT Home Exercise Plan progress as needed   Recommended Other Services None   Consulted and Agree with Plan of Care Patient      Patient will benefit from skilled therapeutic intervention in order to improve the following deficits and impairments:  Abnormal gait, Difficulty walking, Decreased endurance, Pain, Decreased activity tolerance, Decreased balance, Decreased strength, Decreased mobility  Visit Diagnosis: Muscle weakness (generalized)  Other abnormalities of gait and mobility     Problem List Patient Active Problem List   Diagnosis Date Noted  . Right ankle pain   . Pain   . Tibial pain   . Acute idiopathic gout of right ankle   . Right knee pain   . Diabetes mellitus type 2 in obese ([place])   . Labile blood pressure   . Acute blood loss anemia   . Diabetes mellitus type 2 in nonobese (H[facility])   . Debility 02/22/2016  . 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 Weakness of both lower extremities   . Lower extremity weakness   . Pneumonia   . Acute lumbar back pain   . Non-traumatic rhabdomyolysis   . Retroperitoneal bleed   . Back pain   . Leg weakness, bilateral   .  Paroxysmal atrial fibrillation ([facility])   . Ventricular fibrillation ([place]) 02/09/2016  . Aortic aneurysm without rupture ([place]) 02/09/2016  . Aortic insufficiency 02/09/2016  . HCAP (healthcare-associated pneumonia)   . Cardiac arrest ([place]) 02/01/2016  . Acute on chronic systolic heart failure ([place])   . Acute encephalopathy   . Thyroid activity decreased   . Acute hypoxemic respiratory failure ([place])   . Acute respiratory failure ([place])   . Encounter for central line placement   . Arrhythmia   . Altered mental  status   . CKD (chronic kidney disease), stage IV ([place]) 08/06/2013    Class: Chronic  . Long term (current) use of anticoagulants 08/06/2013    Class: Chronic  . Special screening for malignant neoplasms, colon 04/11/2013  . Colon cancer screening 03/04/2013  . Chronic anticoagulation 03/04/2013  . Atrial flutter ([place]) 03/18/2012    Class: Acute  . Chronic systolic heart failure ([place]) 02/13/2012    Class: Acute  . Hypertension, accelerated 02/13/2012  . Aortic valve regurgitation, acquired 02/13/2012  . Aortic root enlargement ([place]) 02/13/2012  . Hyperlipidemia 02/13/2012  . Left bundle branch block 02/13/2012  . Obesity (BMI 30-39.9) 02/13/2012    Earlie Counts, PT 05/08/16 2:47 PM   [facility] [address], [place], [place], [address] Phone: [phone]   Fax:  [phone]  Name: Eric Qin  MD MRN: [id number] Date of Birth: 11/15/1950

## 2016-05-10 ENCOUNTER — Encounter: Payer: Self-pay | Admitting: Physical Therapy

## 2016-05-10 ENCOUNTER — Ambulatory Visit: Payer: BC Managed Care – PPO | Admitting: Physical Therapy

## 2016-05-10 DIAGNOSIS — R2689 Other abnormalities of gait and mobility: Secondary | ICD-10-CM

## 2016-05-10 DIAGNOSIS — M6281 Muscle weakness (generalized): Secondary | ICD-10-CM

## 2016-05-10 NOTE — Therapy (Signed)
[facility] [address] [place], [place], [address] Phone: [phone]   Fax:  [phone]  Physical Therapy Treatment  Patient Details  Name: Eric Lanman  MD MRN: [id number] Date of Birth: 11-16-1950 Referring Provider: Dr. Roe Coombs  Encounter Date: 05/10/2016      PT End of Session - 05/10/16 1413    Visit Number 5   Date for PT Re-Evaluation 08/26/16   Authorization Type BCBS   PT Start Time 1408   PT Stop Time 1450   PT Time Calculation (min) 42 min   Activity Tolerance Patient tolerated treatment well   Behavior During Therapy [facility] for tasks assessed/performed      Past Medical History:  Diagnosis Date  . CHF (congestive heart failure) ([place])   . Chronic kidney disease    kidney fx studies increased   . Chronic lower back pain   . Claustrophobia   . Dysrhythmia    "palpitations"  . Exertional dyspnea 01/2012  . Heart murmur   . Hypertension   . Hypothyroidism   . Migraine 02/13/12   "opthalmic"  . Varicose vein of leg    right    Past Surgical History:  Procedure Laterality Date  . CARDIAC CATHETERIZATION N/A 02/17/2016   Procedure: Left Heart Cath and Coronary Angiography;  Surgeon: Jettie Booze, MD;  Location: [place] [facility];  Service: Cardiovascular;  Laterality: N/A;  . CARDIOVERSION  03/22/2012   Procedure: CARDIOVERSION;  Surgeon: Candee Furbish, MD;  Location: [facility] ENDOSCOPY;  Service: Cardiovascular;  Laterality: N/A;  . CARDIOVERSION  04/19/2012   Procedure: CARDIOVERSION;  Surgeon: Sinclair Grooms, MD;  Location: [place];  Service: Cardiovascular;  Laterality: N/A;  . COLONOSCOPY N/A 04/11/2013   Procedure: COLONOSCOPY;  Surgeon: Inda Castle, MD;  Location: [facility] ENDOSCOPY;  Service: Endoscopy;  Laterality: N/A;  . COLONOSCOPY N/A 04/11/2013   Procedure: COLONOSCOPY;  Surgeon: Inda Castle, MD;  Location: [facility] ENDOSCOPY;  Service: Endoscopy;  Laterality: N/A;  . EP  IMPLANTABLE DEVICE N/A 02/17/2016   Procedure: BiV ICD Insertion CRT-D;  Surgeon: Evans Lance, MD;  Location: [place] [facility];  Service: Cardiovascular;  Laterality: N/A;  . FINGER SURGERY  2012   "4th digit right hand; thumb on left hand"  . RADIOLOGY WITH ANESTHESIA N/A 02/11/2016   Procedure: MRI OF THE BRAIN WITHOUT CONTRAST, LUMBAR WITHOUT CONTRAST;  Surgeon: Medication Radiologist, MD;  Location: [place];  Service: Radiology;  Laterality: N/A;  DR. WOOD/MRI  . Skin melanocytoma excision  2012   "above left clavicle"  . TEE WITHOUT CARDIOVERSION  03/22/2012   Procedure: TRANSESOPHAGEAL ECHOCARDIOGRAM (TEE);  Surgeon: Candee Furbish, MD;  Location: [facility] ENDOSCOPY;  Service: Cardiovascular;  Laterality: N/A;  . TEE WITHOUT CARDIOVERSION N/A 02/08/2016   Procedure: TRANSESOPHAGEAL ECHOCARDIOGRAM (TEE);  Surgeon: Lelon Perla, MD;  Location: [facility] ENDOSCOPY;  Service: Cardiovascular;  Laterality: N/A;    There were no vitals filed for this visit.      Subjective Assessment - 05/10/16 1409    Subjective Went upstairs, doing some chores around the house, quad has a tight feeling to it.    Currently in Pain? Yes   Pain Score 6    Pain Location Leg   Pain Orientation Right   Pain Descriptors / Indicators Sore   Multiple Pain Sites No  [place] Adult PT Treatment/Exercise - 05/10/16 0001      Knee/Hip Exercises: Aerobic   Nustep L1 x 6 min, L2 x 4 min     Knee/Hip Exercises: Standing   Hip ADduction AROM;Strengthening;Right;2 sets;10 reps   Other Standing Knee Exercises Weight shifting with Rt leg in back 2 x10   SBA     Knee/Hip Exercises: Seated   Sit to Sand --  5x on Aiex no UE, then 8x without pad slight use of RTUE;.     Knee/Hip Exercises: Supine   Quad Sets Strengthening;Right;2 sets;5 reps   Quad Sets Limitations tapping at [employer] with [employer] Strengthening;Both;2 sets;5 reps  VC to not hold breathe.   Straight Leg  Raises --  3x   Straight Leg Raises Limitations needed 50% assistance; able to contract abdominals   Other Supine Knee/Hip Exercises Supine hip flexion 2x 10   Bil   Other Supine Knee/Hip Exercises red band clamshells 2x10                  PT Short Term Goals - 05/08/16 1446      PT SHORT TERM GOAL #1   Title independent with initial HEP    Time 4   Period Weeks   Status On-going     PT SHORT TERM GOAL #2   Title be able to go to the gym to do the bike or elliptical for improving strength   Time 4   Period Weeks   Status On-going     PT SHORT TERM GOAL #3   Title right knee extension AROM in sitting -30 degress due to increase strength   Time 4   Period Weeks   Status On-going     PT SHORT TERM GOAL #4   Title pain in right leg decreased >/= 25%    Time 4   Period Weeks   Status On-going     PT SHORT TERM GOAL #5   Title able to get in and out of car without helping his right leg due to increased strength   Time 4   Period Weeks   Status On-going           PT Long Term Goals - 05/02/16 1721      PT LONG TERM GOAL #1   Title independent with HEP and understand how to progress himself   Time 8   Period Weeks   Status On-going     PT LONG TERM GOAL #2   Title able to stand for 25 min with pain decreased >/= 50% so he is able to cook a meal.   Period Weeks     PT LONG TERM GOAL #3   Title going up and down stairs with step to step with a cane   Time 8   Period Weeks   Status On-going     PT LONG TERM GOAL #4   Title sit on stool and scoot around office to assess patients   Time 8   Period Weeks   Status On-going     PT LONG TERM GOAL #5   Title return to household chores due to lower extremity strength >/= 4/5   Time 8   Period Weeks   Status On-going     PT LONG TERM GOAL #6   Title return to driving due to increased right lower extremity strength >/= 4/5   Time 8   Period Weeks   Status On-going  PT LONG TERM GOAL #7    Title pain decreased >/= 50% in right knee   Time 8   Period Weeks   Status On-going               Plan - 05/10/16 1413    Clinical Impression Statement Today pt was able to perform sit to stand from mat table level and very min use of his RTUE. This was an improvement from sitting up higher on the [employer]ire[employer]. Quad set was looking stronger, still unable to do a SAQ/heel lift independently. pt was bale to facilitate the rectus femoris during marching in supine independnently.    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   PT Frequency 3x / week   PT Duration Other (comment)   PT Treatment/Interventions ADLs/Self Care Home Management;Cryotherapy;Electrical Stimulation;Functional mobility training;Stair training;Gait training;Ultrasound;Moist Heat;Therapeutic activities;Therapeutic exercise;Balance training;Neuromuscular re-education;Patient/family education;Passive range of motion;Manual techniques;Energy conservation;Other (comment)   PT Next Visit Plan abdominal bracing with exercise, nustep no arms, , modalities for pain, bil. hip abd/add and flexion strength, Balance ex   Consulted and Agree with Plan of Care Patient      Patient will benefit from skilled therapeutic intervention in order to improve the following deficits and impairments:  Abnormal gait, Difficulty walking, Decreased endurance, Pain, Decreased activity tolerance, Decreased balance, Decreased strength, Decreased mobility  Visit Diagnosis: Muscle weakness (generalized)  Other abnormalities of gait and mobility     Problem List Patient Active Problem List   Diagnosis Date Noted  . Right ankle pain   . Pain   . Tibial pain   . Acute idiopathic gout of right ankle   . Right knee pain   . Diabetes mellitus type 2 in obese ([place])   . Labile blood pressure   . Acute blood loss anemia   . Diabetes mellitus type 2 in  nonobese ([facility])   . Debility 02/22/2016  . 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 Weakness of both lower extremities   . Lower extremity weakness   . Pneumonia   . Acute lumbar back pain   . Non-traumatic rhabdomyolysis   . Retroperitoneal bleed   . Back pain   . Leg weakness, bilateral   . Paroxysmal atrial fibrillation ([place])   . Ventricular fibrillation ([place]) 02/09/2016  . Aortic aneurysm without rupture ([place]) 02/09/2016  . Aortic insufficiency 02/09/2016  . HCAP (healthcare-associated pneumonia)   . Cardiac arrest ([place]) 02/01/2016  . Acute on chronic systolic heart failure ([place])   . Acute encephalopathy   . Thyroid activity decreased   . Acute hypoxemic respiratory failure ([place])   . Acute respiratory failure ([place])   . Encounter for central line placement   . Arrhythmia   . Altered mental status   . CKD (chronic kidney disease), stage IV ([place]) 08/06/2013    Class: Chronic  . Long term (current) use of anticoagulants 08/06/2013    Class: Chronic  . Special screening for malignant neoplasms, colon 04/11/2013  . Colon cancer screening 03/04/2013  . Chronic anticoagulation 03/04/2013  . Atrial flutter ([place]) 03/18/2012    Class: Acute  . Chronic systolic heart failure ([place]) 02/13/2012    Class: Acute  . Hypertension, accelerated 02/13/2012  . Aortic valve regurgitation, acquired 02/13/2012  . Aortic root enlargement ([place]) 02/13/2012  . Hyperlipidemia 02/13/2012  . Left bundle branch block 02/13/2012  . Obesity (BMI 30-39.9) 02/13/2012    Lizza Huffaker, PTA 05/10/2016,  4:57 PM  [facility] [address] [place], [place], [address] Phone: [phone]   Fax:  [phone]  Name: [place] Orser  MD MRN: [id number] Date of Birth: 16-Jun-1951

## 2016-05-12 ENCOUNTER — Ambulatory Visit: Payer: BC Managed Care – PPO | Admitting: Physical Therapy

## 2016-05-15 ENCOUNTER — Encounter: Payer: BC Managed Care – PPO | Admitting: Physical Therapy

## 2016-05-17 ENCOUNTER — Encounter: Payer: BC Managed Care – PPO | Admitting: Physical Therapy

## 2016-05-19 ENCOUNTER — Ambulatory Visit: Payer: BC Managed Care – PPO | Admitting: Physical Therapy

## 2016-05-22 ENCOUNTER — Encounter: Payer: Self-pay | Admitting: Physical Therapy

## 2016-05-22 ENCOUNTER — Ambulatory Visit: Payer: BC Managed Care – PPO | Admitting: Physical Therapy

## 2016-05-22 DIAGNOSIS — R2689 Other abnormalities of gait and mobility: Secondary | ICD-10-CM

## 2016-05-22 DIAGNOSIS — M6281 Muscle weakness (generalized): Secondary | ICD-10-CM

## 2016-05-22 DIAGNOSIS — M25561 Pain in right knee: Secondary | ICD-10-CM

## 2016-05-22 NOTE — Therapy (Signed)
[facility] [address] [place], [place], [address] Phone: [phone]   Fax:  [phone]  Physical Therapy Treatment  Patient Details  Name: Eric Greenaway  MD MRN: [id number] Date of Birth: [birth year] Referring Provider: Dr. Roe Coombs  Encounter Date: 05/22/2016      PT End of Session - 05/22/16 1538    Visit Number 6   Date for PT Re-Evaluation 08/26/16   Authorization Type BCBS   PT Start Time 1533   PT Stop Time 1615   PT Time Calculation (min) 42 min   Activity Tolerance Patient tolerated treatment well   Behavior During Therapy [facility] for tasks assessed/performed      Past Medical History:  Diagnosis Date  . CHF (congestive heart failure) ([place])   . Chronic kidney disease    kidney fx studies increased   . Chronic lower back pain   . Claustrophobia   . Dysrhythmia    "palpitations"  . Exertional dyspnea 01/2012  . Heart murmur   . Hypertension   . Hypothyroidism   . Migraine 02/13/12   "opthalmic"  . Varicose vein of leg    right    Past Surgical History:  Procedure Laterality Date  . CARDIAC CATHETERIZATION N/A 02/17/2016   Procedure: Left Heart Cath and Coronary Angiography;  Surgeon: Jettie Booze, MD;  Location: [place] CV LAB;  Service: Cardiovascular;  Laterality: N/A;  . CARDIOVERSION  03/22/2012   Procedure: CARDIOVERSION;  Surgeon: Candee Furbish, MD;  Location: [facility] ENDOSCOPY;  Service: Cardiovascular;  Laterality: N/A;  . CARDIOVERSION  04/19/2012   Procedure: CARDIOVERSION;  Surgeon: Sinclair Grooms, MD;  Location: [place];  Service: Cardiovascular;  Laterality: N/A;  . COLONOSCOPY N/A 04/11/2013   Procedure: COLONOSCOPY;  Surgeon: Inda Castle, MD;  [facility] ENDOSCOPY;  Service: Endoscopy;  Laterality: N/A;  . COLONOSCOPY N/A 04/11/2013   Procedure: COLONOSCOPY;  Surgeon: Inda Castle, MD;  [facility] ENDOSCOPY;  Service: Endoscopy;  Laterality: N/A;  . EP  IMPLANTABLE DEVICE N/A 02/17/2016   Procedure: BiV ICD Insertion CRT-D;  Surgeon: Evans Lance, MD;  Location: [place] CV LAB;  Service: Cardiovascular;  Laterality: N/A;  . FINGER SURGERY  2012   "4th digit right hand; thumb on left hand"  . RADIOLOGY WITH ANESTHESIA N/A 02/11/2016   Procedure: MRI OF THE BRAIN WITHOUT CONTRAST, LUMBAR WITHOUT CONTRAST;  Surgeon: Medication Radiologist, MD;  Location: [place];  Service: Radiology;  Laterality: N/A;  DR. WOOD/MRI  . Skin melanocytoma excision  2012   "above left clavicle"  . TEE WITHOUT CARDIOVERSION  03/22/2012   Procedure: TRANSESOPHAGEAL ECHOCARDIOGRAM (TEE);  Surgeon: Candee Furbish, MD;  Location[facility] ENDOSCOPY;  Service: Cardiovascular;  Laterality: N/A;  . TEE WITHOUT CARDIOVERSION N/A 02/08/2016   Procedure: TRANSESOPHAGEAL ECHOCARDIOGRAM (TEE);  Surgeon: Lelon Perla, MD;  Location: [facility] ENDOSCOPY;  Service: Cardiovascular;  Laterality: N/A;    There were no vitals filed for this visit.      Subjective Assessment - 05/22/16 1536    Subjective I had a UTI last week which was why I wasn't here.    Currently in Pain? No/denies   Multiple Pain Sites No            [facility] PT Assessment - 05/22/16 0001      AROM   Right Knee Extension 70  Unable to maintain  [place] Adult PT Treatment/Exercise - 05/22/16 0001      Knee/Hip Exercises: Aerobic   Nustep L2 x 10 min     Knee/Hip Exercises: Standing   Forward Step Up Right;Both;2 sets;10 reps;Hand Hold: 2;Step Height: 2"   Forward Step Up Limitations First set of 10x was more of weight shift onto the box      Knee/Hip Exercises: Supine   Bridges with Clamshell Strengthening;Both;2 sets;10 reps  Green[place]   Knee Extension --  Eccentric knee ext from flexion 20x, slowly heel slide out.    Other Supine Knee/Hip Exercises Supine hip flexion 2x 10   Bil, added 1# to ankle 2 x10                  PT Short Term Goals - 05/22/16 1543       PT SHORT TERM GOAL #1   Title independent with initial HEP    Time 4   Period Weeks   Status Achieved     PT SHORT TERM GOAL #4   Title pain in right leg decreased >/= 25%    Time 4   Period Weeks   Status On-going  About the same.     PT SHORT TERM GOAL #5   Title able to get in and out of car without helping his right leg due to increased strength   Period Weeks   Status Achieved           PT Long Term Goals - 05/02/16 1721      PT LONG TERM GOAL #1   Title independent with HEP and understand how to progress himself   Time 8   Period Weeks   Status On-going     PT LONG TERM GOAL #2   Title able to stand for 25 min with pain decreased >/= 50% so he is able to cook a meal.   Period Weeks     PT LONG TERM GOAL #3   Title going up and down stairs with step to step with a cane   Time 8   Period Weeks   Status On-going     PT LONG TERM GOAL #4   Title sit on stool and scoot around office to assess patients   Time 8   Period Weeks   Status On-going     PT LONG TERM GOAL #5   Title return to household chores due to lower extremity strength >/= 4/5   Time 8   Period Weeks   Status On-going     PT LONG TERM GOAL #6   Title return to driving due to increased right lower extremity strength >/= 4/5   Time 8   Period Weeks   Status On-going     PT LONG TERM GOAL #7   Title pain decreased >/= 50% in right knee   Time 8   Period Weeks   Status On-going               Plan - 05/22/16 1612    Clinical Impression Statement Pt had UTI last week which kept him from coming to PT. He demonstrated improved quad control today and was able to step up on a 2 inch box  today with minimal use of the walker. and very good control at the knee joint.    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   PT Duration Other (comment)   PT Treatment/Interventions ADLs/Self  Care  Home Management;Cryotherapy;Electrical Stimulation;Functional mobility training;Stair training;Gait training;Ultrasound;Moist Heat;Therapeutic activities;Therapeutic exercise;Balance training;Neuromuscular re-education;Patient/family education;Passive range of motion;Manual techniques;Energy conservation;Other (comment)   PT Next Visit Plan Quad strength and consider trying gait with cane.   Consulted and Agree with Plan of Care Patient      Patient will benefit from skilled therapeutic intervention in order to improve the following deficits and impairments:  Abnormal gait, Difficulty walking, Decreased endurance, Pain, Decreased activity tolerance, Decreased balance, Decreased strength, Decreased mobility  Visit Diagnosis: Muscle weakness (generalized)  Other abnormalities of gait and mobility  Pain in right knee     Problem List Patient Active Problem List   Diagnosis Date Noted  . Right ankle pain   . Pain   . Tibial pain   . Acute idiopathic gout of right ankle   . Right knee pain   . Diabetes mellitus type 2 in obese ([place])   . Labile blood pressure   . Acute blood loss anemia   . Diabetes mellitus type 2 in nonobese ([facility])   . Debility 02/22/2016  . 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 Weakness of both lower extremities   . Lower extremity weakness   . Pneumonia   . Acute lumbar back pain   . Non-traumatic rhabdomyolysis   . Retroperitoneal bleed   . Back pain   . Leg weakness, bilateral   . Paroxysmal atrial fibrillation ([place])   . Ventricular fibrillation ([place]) 02/09/2016  . Aortic aneurysm without rupture ([place]) 02/09/2016  . Aortic insufficiency 02/09/2016  . HCAP (healthcare-associated pneumonia)   . Cardiac arrest ([place]) 02/01/2016  . Acute on chronic systolic heart failure ([place])   . Acute encephalopathy   . Thyroid activity decreased   . Acute hypoxemic respiratory failure ([place])   . Acute respiratory failure ([place])   . Encounter for  central line placement   . Arrhythmia   . Altered mental status   . CKD (chronic kidney disease), stage IV ([place]) 08/06/2013    Class: Chronic  . Long term (current) use of anticoagulants 08/06/2013    Class: Chronic  . Special screening for malignant neoplasms, colon 04/11/2013  . Colon cancer screening 03/04/2013  . Chronic anticoagulation 03/04/2013  . Atrial flutter ([place]) 03/18/2012    Class: Acute  . Chronic systolic heart failure ([place]) 02/13/2012    Class: Acute  . Hypertension, accelerated 02/13/2012  . Aortic valve regurgitation, acquired 02/13/2012  . Aortic root enlargement ([place]) 02/13/2012  . Hyperlipidemia 02/13/2012  . Left bundle branch block 02/13/2012  . Obesity (BMI 30-39.9) 02/13/2012    Jaron Czarnecki,PTA 05/22/2016, 4:17 PM  [facility] [address] [place], [place], [address] Phone: [phone]   Fax:  [phone]  Name: Jaegar Sammarco  MD MRN: [id number] Date of Birth: 1951/02/01

## 2016-05-23 ENCOUNTER — Telehealth: Payer: Self-pay | Admitting: Physical Medicine & Rehabilitation

## 2016-05-23 MED ORDER — MAGNESIUM OXIDE 400 MG PO TABS: 400.0000 mg | ORAL_TABLET | Freq: Every day | ORAL | 1 refills | Status: DC

## 2016-05-23 NOTE — Telephone Encounter (Signed)
Krishanda with [facility] needs to get a refill on MAG-OX for patient.  Please call them at [phone].

## 2016-05-23 NOTE — Telephone Encounter (Signed)
done

## 2016-05-24 ENCOUNTER — Ambulatory Visit: Payer: BC Managed Care – PPO | Admitting: Physical Therapy

## 2016-05-24 ENCOUNTER — Encounter: Payer: Self-pay | Admitting: Physical Therapy

## 2016-05-24 DIAGNOSIS — M6281 Muscle weakness (generalized): Secondary | ICD-10-CM | POA: Diagnosis not present

## 2016-05-24 DIAGNOSIS — R2689 Other abnormalities of gait and mobility: Secondary | ICD-10-CM

## 2016-05-24 DIAGNOSIS — M25561 Pain in right knee: Secondary | ICD-10-CM

## 2016-05-24 NOTE — Therapy (Signed)
[facility] [address], [place], [place], [address] Phone: [phone]   Fax:  [phone]  Physical Therapy Treatment  Patient Details  Name: Eric Pittsenbarger  MD MRN: [id number] Date of Birth: 1951/04/29 Referring Provider: Dr. Roe Coombs  Encounter Date: 05/24/2016      PT End of Session - 05/24/16 1540    Visit Number 7   Date for PT Re-Evaluation 08/26/16   Authorization Type BCBS   PT Start Time 1535   PT Stop Time 1615   PT Time Calculation (min) 40 min   Activity Tolerance Patient tolerated treatment well   Behavior During Therapy [facility] for tasks assessed/performed      Past Medical History:  Diagnosis Date  . CHF (congestive heart failure) ([place])   . Chronic kidney disease    kidney fx studies increased   . Chronic lower back pain   . Claustrophobia   . Dysrhythmia    "palpitations"  . Exertional dyspnea 01/2012  . Heart murmur   . Hypertension   . Hypothyroidism   . Migraine 02/13/12   "opthalmic"  . Varicose vein of leg    right    Past Surgical History:  Procedure Laterality Date  . CARDIAC CATHETERIZATION N/A 02/17/2016   Procedure: Left Heart Cath and Coronary Angiography;  Surgeon: Jettie Booze, MD;  Location: [place] CV LAB;  Service: Cardiovascular;  Laterality: N/A;  . CARDIOVERSION  03/22/2012   Procedure: CARDIOVERSION;  Surgeon: Candee Furbish, MD;  Location: [facility] ENDOSCOPY;  Service: Cardiovascular;  Laterality: N/A;  . CARDIOVERSION  04/19/2012   Procedure: CARDIOVERSION;  Surgeon: Sinclair Grooms, MD;  Location: [place];  Service: Cardiovascular;  Laterality: N/A;  . COLONOSCOPY N/A 04/11/2013   Procedure: COLONOSCOPY;  Surgeon: Inda Castle, MD;  [facility];  Service: Endoscopy;  Laterality: N/A;  . COLONOSCOPY N/A 04/11/2013   Procedure: COLONOSCOPY;  Surgeon: Inda Castle, MD;  [facility]OS[facility];  Service: Endoscopy;  Laterality: N/A;  . EP  IMPLANTABLE DEVICE N/A 02/17/2016   Procedure: BiV ICD Insertion CRT-D;  Surgeon: Evans Lance, MD;  Location: [place] CV LAB;  Service: Cardiovascular;  Laterality: N/A;  . FINGER SURGERY  2012   "4th digit right hand; thumb on left hand"  . RADIOLOGY WITH ANESTHESIA N/A 02/11/2016   Procedure: MRI OF THE BRAIN WITHOUT CONTRAST, LUMBAR WITHOUT CONTRAST;  Surgeon: Medication Radiologist, MD;  Location: [place];  Service: Radiology;  Laterality: N/A;  DR. WOOD/MRI  . Skin melanocytoma excision  2012   "above left clavicle"  . TEE WITHOUT CARDIOVERSION  03/22/2012   Procedure: TRANSESOPHAGEAL ECHOCARDIOGRAM (TEE);  Surgeon: Candee Furbish, MD;  Location: [facility] ENDOSCOPY;  Service: Cardiovascular;  Laterality: N/A;  . TEE WITHOUT CARDIOVERSION N/A 02/08/2016   Procedure: TRANSESOPHAGEAL ECHOCARDIOGRAM (TEE);  Surgeon: Lelon Perla, MD;  Location: [facility] ENDOSCOPY;  Service: Cardiovascular;  Laterality: N/A;    There were no vitals filed for this visit.                       [place] Adult PT Treatment/Exercise - 05/24/16 0001      Ambulation/Gait   Ambulation/Gait Yes   Ambulation/Gait Assistance 5: Supervision   Ambulation Distance (Feet) 40 Feet  2 x   Assistive device Rolling walker   Gait Comments VC to bear more weight into the RTLE.      Knee/Hip Exercises: Aerobic   Nustep L2 x 10 min  Knee/Hip Exercises: Machines for Strengthening   Total Gym Leg Press Seat #9 Bil 40# 2 x10  VC to push > with RT vs LT     Knee/Hip Exercises: Standing   Forward Step Up Right;2 sets;10 reps;Step Height: 2";Step Height: 4"                  PT Short Term Goals - 05/24/16 1549      PT SHORT TERM GOAL #3   Title right knee extension AROM in sitting -30 degress due to increase strength   Time 4   Period Weeks   Status On-going           PT Long Term Goals - 05/02/16 1721      PT LONG TERM GOAL #1   Title independent with HEP and understand how to progress  himself   Time 8   Period Weeks   Status On-going     PT LONG TERM GOAL #2   Title able to stand for 25 min with pain decreased >/= 50% so he is able to cook a meal.   Period Weeks     PT LONG TERM GOAL #3   Title going up and down stairs with step to step with a cane   Time 8   Period Weeks   Status On-going     PT LONG TERM GOAL #4   Title sit on stool and scoot around office to assess patients   Time 8   Period Weeks   Status On-going     PT LONG TERM GOAL #5   Title return to household chores due to lower extremity strength >/= 4/5   Time 8   Period Weeks   Status On-going     PT LONG TERM GOAL #6   Title return to driving due to increased right lower extremity strength >/= 4/5   Time 8   Period Weeks   Status On-going     PT LONG TERM GOAL #7   Title pain decreased >/= 50% in right knee   Time 8   Period Weeks   Status On-going               Plan - 05/24/16 1540    Clinical Impression Statement Pt able to step up onto a 4 inch using the RW for UE bil. We worked on increasing his weightbearing into the RTLE when he walks with the walker. pt demonstrates significanlty improved control at the knee.    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   PT Frequency 3x / week   PT Duration Other (comment)   PT Treatment/Interventions ADLs/Self Care Home Management;Cryotherapy;Electrical Stimulation;Functional mobility training;Stair training;Gait training;Ultrasound;Moist Heat;Therapeutic activities;Therapeutic exercise;Balance training;Neuromuscular re-education;Patient/family education;Passive range of motion;Manual techniques;Energy conservation;Other (comment)   PT Next Visit Plan Quad strength and consider trying gait with cane.   Consulted and Agree with Plan of Care Patient      Patient will benefit from skilled therapeutic intervention in order to improve the  following deficits and impairments:  Abnormal gait, Difficulty walking, Decreased endurance, Pain, Decreased activity tolerance, Decreased balance, Decreased strength, Decreased mobility  Visit Diagnosis: Muscle weakness (generalized)  Other abnormalities of gait and mobility  Pain in right knee     Problem List Patient Active Problem List   Diagnosis Date Noted  . Right ankle pain   . Pain   . Tibial pain   . Acute idiopathic  gout of right ankle   . Right knee pain   . Diabetes mellitus type 2 in obese ([place])   . Labile blood pressure   . Acute blood loss anemia   . Diabetes mellitus type 2 in nonobese ([facility])   . Debility 02/22/2016  . 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 Weakness of both lower extremities   . Lower extremity weakness   . Pneumonia   . Acute lumbar back pain   . Non-traumatic rhabdomyolysis   . Retroperitoneal bleed   . Back pain   . Leg weakness, bilateral   . Paroxysmal atrial fibrillation ([place])   . Ventricular fibrillation ([place]) 02/09/2016  . Aortic aneurysm without rupture ([place]) 02/09/2016  . Aortic insufficiency 02/09/2016  . HCAP (healthcare-associated pneumonia)   . Cardiac arrest ([place]) 02/01/2016  . Acute on chronic systolic heart failure ([place])   . Acute encephalopathy   . Thyroid activity decreased   . Acute hypoxemic respiratory failure ([place])   . Acute respiratory failure ([place])   . Encounter for central line placement   . Arrhythmia   . Altered mental status   . CKD (chronic kidney disease), stage IV ([place]) 08/06/2013    Class: Chronic  . Long term (current) use of anticoagulants 08/06/2013    Class: Chronic  . Special screening for malignant neoplasms, colon 04/11/2013  . Colon cancer screening 03/04/2013  . Chronic anticoagulation 03/04/2013  . Atrial flutter ([place]) 03/18/2012    Class: Acute  . Chronic systolic heart failure ([place]) 02/13/2012    Class: Acute  . Hypertension, accelerated 02/13/2012  . Aortic  valve regurgitation, acquired 02/13/2012  . Aortic root enlargement ([place]) 02/13/2012  . Hyperlipidemia 02/13/2012  . Left bundle branch block 02/13/2012  . Obesity (BMI 30-39.9) 02/13/2012    COCHRAN,JENNIFER,PTA 05/24/2016, 4:16 PM  [facility] [address] [place], [place], [address] Phone: [phone]   Fax:  [phone]  Name: Eric Barricklow  MD MRN: [id number] Date of Birth: [birth year]

## 2016-05-25 ENCOUNTER — Encounter: Payer: Self-pay | Admitting: Internal Medicine

## 2016-05-25 ENCOUNTER — Other Ambulatory Visit: Payer: Self-pay

## 2016-05-25 ENCOUNTER — Ambulatory Visit (INDEPENDENT_AMBULATORY_CARE_PROVIDER_SITE_OTHER): Payer: BC Managed Care – PPO | Admitting: Internal Medicine

## 2016-05-25 VITALS — BP 150/78 | HR 65 | Ht 69.0 in | Wt 221.0 lb

## 2016-05-25 DIAGNOSIS — I5022 Chronic systolic (congestive) heart failure: Secondary | ICD-10-CM | POA: Diagnosis not present

## 2016-05-25 DIAGNOSIS — Z95810 Presence of automatic (implantable) cardiac defibrillator: Secondary | ICD-10-CM | POA: Diagnosis not present

## 2016-05-25 LAB — CUP PACEART INCLINIC DEVICE CHECK
Battery Remaining Longevity: 87.6
Date Time Interrogation Session: 20170831124159
HIGH POWER IMPEDANCE MEASURED VALUE: 65.25 Ohm
Implantable Lead Implant Date: 20170525
Implantable Lead Implant Date: 20170525
Lead Channel Impedance Value: 512.5 Ohm
Lead Channel Impedance Value: 812.5 Ohm
Lead Channel Pacing Threshold Amplitude: 0.75 V
Lead Channel Pacing Threshold Amplitude: 0.75 V
Lead Channel Pacing Threshold Amplitude: 0.75 V
Lead Channel Setting Pacing Amplitude: 2 V
Lead Channel Setting Pacing Pulse Width: 0.5 ms
MDC IDC LEAD IMPLANT DT: 20170525
MDC IDC LEAD LOCATION: 753858
MDC IDC LEAD LOCATION: 753859
MDC IDC LEAD LOCATION: 753860
MDC IDC LEAD MODEL: 7122
MDC IDC MSMT LEADCHNL LV PACING THRESHOLD PULSEWIDTH: 0.5 ms
MDC IDC MSMT LEADCHNL RA PACING THRESHOLD PULSEWIDTH: 0.5 ms
MDC IDC MSMT LEADCHNL RA SENSING INTR AMPL: 4 mV
MDC IDC MSMT LEADCHNL RV IMPEDANCE VALUE: 450 Ohm
MDC IDC MSMT LEADCHNL RV PACING THRESHOLD PULSEWIDTH: 0.5 ms
MDC IDC MSMT LEADCHNL RV SENSING INTR AMPL: 11.4 mV
MDC IDC PG SERIAL: 7357926
MDC IDC SET LEADCHNL LV PACING PULSEWIDTH: 0.5 ms
MDC IDC SET LEADCHNL RA PACING AMPLITUDE: 3.5 V
MDC IDC SET LEADCHNL RV PACING AMPLITUDE: 2 V
MDC IDC SET LEADCHNL RV SENSING SENSITIVITY: 0.5 mV
MDC IDC STAT BRADY RA PERCENT PACED: 3.4 %
MDC IDC STAT BRADY RV PERCENT PACED: 98 %

## 2016-05-25 NOTE — Progress Notes (Signed)
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  He underwent BiV ICD implant several months ago. In the interim, he has had a slow recovery. He is undergoing rehab. He feels improved. Allergies  Allergen Reactions  . Lactose Intolerance (Gi) Diarrhea     Current Outpatient Prescriptions  Medication Sig Dispense Refill  . amiodarone (PACERONE) 200 MG tablet Take 1 tablet (200 mg total) by mouth daily. 30 tablet 11  . apixaban (ELIQUIS) 5 MG TABS tablet Take 1 tablet (5 mg total) by mouth 2 (two) times daily. 60 tablet 11  . carvedilol (COREG) 25 MG tablet Take 1 tablet (25 mg total) by mouth 2 (two) times daily. 60 tablet 0  . diclofenac sodium (VOLTAREN) 1 % GEL Apply 2 g topically 4 (four) times daily. 1 Tube 1  . docusate sodium (COLACE) 100 MG capsule Take 100 mg by mouth daily as needed for mild constipation.    Marland Kitchen doxycycline (VIBRA-TABS) 100 MG tablet Take 1 tablet by mouth 2 (two) times daily.  0  . fluticasone (FLONASE) 50 MCG/ACT nasal spray Place 2 sprays into the nose as needed for allergies.     . furosemide (LASIX) 40 MG tablet Take 1 tablet (40 mg total) by mouth daily. 90 tablet 0  . isosorbide-hydrALAZINE (BIDIL) 20-37.5 MG tablet Take 0.5 tablets by mouth 3 (three) times daily. 90 tablet 0  . levothyroxine (SYNTHROID, LEVOTHROID) 75 MCG tablet Take 1 tablet (75 mcg total) by mouth daily before breakfast. 30 tablet 11  . meloxicam (MOBIC) 7.5 MG tablet Take 1 tablet by mouth daily.  0  . oxyCODONE-acetaminophen (PERCOCET/ROXICET) 5-325 MG tablet Take 1 tablet by mouth every 6 (six) hours as needed for moderate pain. 90 tablet 0  . magnesium oxide (MAG-OX) 400 MG tablet Take 1 tablet (400 mg total) by mouth daily. (Patient not taking: Reported on 05/25/2016) 30 tablet 1   No current facility-administered medications for this visit.      Past Medical History:  Diagnosis Date   . CHF (congestive heart failure) ([place])   . Chronic kidney disease    kidney fx studies increased   . Chronic lower back pain   . Claustrophobia   . Dysrhythmia    "palpitations"  . Exertional dyspnea 01/2012  . Heart murmur   . Hypertension   . Hypothyroidism   . Migraine 02/13/12   "opthalmic"  . Varicose vein of leg    right    ROS:   All systems reviewed and negative except as noted in the HPI.   Past Surgical History:  Procedure Laterality Date  . CARDIAC CATHETERIZATION N/A 02/17/2016   Procedure: Left Heart Cath and Coronary Angiography;  Surgeon: Jettie Booze, MD;  Location: [place] [facility];  Service: Cardiovascular;  Laterality: N/A;  . CARDIOVERSION  03/22/2012   Procedure: CARDIOVERSION;  Surgeon: Candee Furbish, MD;  Location: [facility] ENDOSCOPY;  Service: Cardiovascular;  Laterality: N/A;  . CARDIOVERSION  04/19/2012   Procedure: CARDIOVERSION;  Surgeon: Sinclair Grooms, MD;  Location: [place];  Service: Cardiovascular;  Laterality: N/A;  . COLONOSCOPY N/A 04/11/2013   Procedure: COLONOSCOPY;  Surgeon: Inda Castle, MD;  [facility];  Service: Endoscopy;  Laterality: N/A;  . COLONOSCOPY N/A 04/11/2013   Procedure: COLONOSCOPY;  Surgeon: Inda Castle, MD;  [facility]OS[facility];  Service: Endoscopy;  Laterality: N/A;  .  EP IMPLANTABLE DEVICE N/A 02/17/2016   Procedure: BiV ICD Insertion CRT-D;  Surgeon: Evans Lance, MD;  Location: [place] CV LAB;  Service: Cardiovascular;  Laterality: N/A;  . FINGER SURGERY  2012   "4th digit right hand; thumb on left hand"  . RADIOLOGY WITH ANESTHESIA N/A 02/11/2016   Procedure: MRI OF THE BRAIN WITHOUT CONTRAST, LUMBAR WITHOUT CONTRAST;  Surgeon: Medication Radiologist, MD;  Location: [place];  Service: Radiology;  Laterality: N/A;  DR. WOOD/MRI  . Skin melanocytoma excision  2012   "above left clavicle"  . TEE WITHOUT CARDIOVERSION  03/22/2012   Procedure: TRANSESOPHAGEAL ECHOCARDIOGRAM (TEE);  Surgeon: Candee Furbish, MD;  Location: [facility] ENDOSCOPY;  Service: Cardiovascular;  Laterality: N/A;  . TEE WITHOUT CARDIOVERSION N/A 02/08/2016   Procedure: TRANSESOPHAGEAL ECHOCARDIOGRAM (TEE);  Surgeon: Lelon Perla, MD;  Location: [facility] ENDOSCOPY;  Service: Cardiovascular;  Laterality: N/A;     Family History  Problem Relation Age of Onset  . Hypertension Mother   . Heart disease Mother   . Heart failure Mother   . Diabetes Mother   . Hypertension Father   . Heart disease Father   . Heart failure Father      Social History   Social History  . Marital status: Married    Spouse name: [name]  . Number of children: 1  . Years of education: N/A   Occupational History  .  [employer]   Social History Main Topics  . Smoking status: Never Smoker  . Smokeless tobacco: Never Used  . Alcohol use Yes     Comment: 02/13/12 "socially"  . Drug use: No  . Sexual activity: Not Currently   Other Topics Concern  . 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     BP (!) 150/78   Pulse 65   Ht 5\' 9"  (1.753 m)   Wt 221 lb (100.2 kg)   BMI 32.64 kg/m   Physical Exam:  Well appearing 65 yo man, NAD HEENT: Unremarkable Neck:  6 cm JVD, no thyromegally Lymphatics:  No adenopathy Back:  No CVA tenderness Lungs:  Clear with no wheezes HEART:  Regular rate rhythm, no murmurs, no rubs, no clicks Abd:  soft, positive bowel sounds, no organomegally, no rebound, no guarding Ext:  2 plus pulses, no edema, no cyanosis, no clubbing Skin:  No rashes no nodules Neuro:  CN II through XII intact, motor grossly intact  EKG - NSR with biv pacing  DEVICE  Normal device function.  See PaceArt for details.   Assess/Plan: 1. VF arrest - he has had no additional ventricular arrhythmias. He will continue low dose amio 2. Chronic systolic heart failure - he is s/p biv ICD and his device is working normally. 3. ICD - his [facility]  [place] device is working normally. 4. Atrial fib -he will continue amio and Eliquis.  Mikle Bosworth.D.

## 2016-05-25 NOTE — Patient Instructions (Addendum)
Medication Instructions:  Your physician recommends that you continue on your current medications as directed. Please refer to the Current Medication list given to you today.   Labwork: None ordered   Testing/Procedures: None ordered   Follow-Up: Your physician wants you to follow-up in: 9 months with Dr Knox Saliva will receive a reminder letter in the mail two months in advance. If you don't receive a letter, please call our office to schedule the follow-up appointment.   Remote monitoring is used to monitor your  ICD from home. This monitoring reduces the number of office visits required to check your device to one time per year. It allows [place] to keep an eye on the functioning of your device to ensure it is working properly. You are scheduled for a device check from home on 08/24/16. You may send your transmission at any time that day. If you have a wireless device, the transmission will be sent automatically. After your physician reviews your transmission, you will receive a postcard with your next transmission date.   [employer]---[phone]  Any Other Special Instructions Will Be Listed Below (If Applicable).     If you need a refill on your cardiac medications before your next appointment, please call your pharmacy.

## 2016-05-26 ENCOUNTER — Ambulatory Visit: Payer: BC Managed Care – PPO | Attending: Internal Medicine | Admitting: Physical Therapy

## 2016-05-26 DIAGNOSIS — M25561 Pain in right knee: Secondary | ICD-10-CM | POA: Diagnosis present

## 2016-05-26 DIAGNOSIS — S7410XD Injury of femoral nerve at hip and thigh level, unspecified leg, subsequent encounter: Secondary | ICD-10-CM | POA: Diagnosis present

## 2016-05-26 DIAGNOSIS — M545 Low back pain: Secondary | ICD-10-CM | POA: Diagnosis present

## 2016-05-26 DIAGNOSIS — R2689 Other abnormalities of gait and mobility: Secondary | ICD-10-CM | POA: Insufficient documentation

## 2016-05-26 DIAGNOSIS — M6281 Muscle weakness (generalized): Secondary | ICD-10-CM | POA: Insufficient documentation

## 2016-05-26 DIAGNOSIS — X58XXXD Exposure to other specified factors, subsequent encounter: Secondary | ICD-10-CM | POA: Insufficient documentation

## 2016-05-26 NOTE — Therapy (Signed)
[facility] [address] [place], [place], [address] Phone: [phone]   Fax:  [phone]  Physical Therapy Treatment  Patient Details  Name: Eric Hardt  MD MRN: [id number] Date of Birth: 06-06-1951 Referring Provider: Dr. Roe Coombs  Encounter Date: 05/26/2016      PT End of Session - 05/26/16 0946    Visit Number 8   Date for PT Re-Evaluation 08/26/16   Authorization Type BCBS   PT Start Time [phone]   PT Stop Time 1023   PT Time Calculation (min) 41 min   Activity Tolerance Patient tolerated treatment well   Behavior During Therapy [facility] for tasks assessed/performed      Past Medical History:  Diagnosis Date  . CHF (congestive heart failure) ([place])   . Chronic kidney disease    kidney fx studies increased   . Chronic lower back pain   . Claustrophobia   . Dysrhythmia    "palpitations"  . Exertional dyspnea 01/2012  . Heart murmur   . Hypertension   . Hypothyroidism   . Migraine 02/13/12   "opthalmic"  . Varicose vein of leg    right    Past Surgical History:  Procedure Laterality Date  . CARDIAC CATHETERIZATION N/A 02/17/2016   Procedure: Left Heart Cath and Coronary Angiography;  Surgeon: Jettie Booze, MD;  Location: [place] CV LAB;  Service: Cardiovascular;  Laterality: N/A;  . CARDIOVERSION  03/22/2012   Procedure: CARDIOVERSION;  Surgeon: Candee Furbish, MD;  Location: [facility] ENDOSCOPY;  Service: Cardiovascular;  Laterality: N/A;  . CARDIOVERSION  04/19/2012   Procedure: CARDIOVERSION;  Surgeon: Sinclair Grooms, MD;  Location: [place];  Service: Cardiovascular;  Laterality: N/A;  . COLONOSCOPY N/A 04/11/2013   Procedure: COLONOSCOPY;  Surgeon: Inda Castle, MD;  Location: [facility] ENDOSCOPY;  Service: Endoscopy;  Laterality: N/A;  . COLONOSCOPY N/A 04/11/2013   Procedure: COLONOSCOPY;  Surgeon: Inda Castle, MD;  Location: [facility] ENDOSCOPY;  Service: Endoscopy;  Laterality: N/A;  . EP  IMPLANTABLE DEVICE N/A 02/17/2016   Procedure: BiV ICD Insertion CRT-D;  Surgeon: Evans Lance, MD;  Location: [place] CV LAB;  Service: Cardiovascular;  Laterality: N/A;  . FINGER SURGERY  2012   "4th digit right hand; thumb on left hand"  . RADIOLOGY WITH ANESTHESIA N/A 02/11/2016   Procedure: MRI OF THE BRAIN WITHOUT CONTRAST, LUMBAR WITHOUT CONTRAST;  Surgeon: Medication Radiologist, MD;  Location: [place];  Service: Radiology;  Laterality: N/A;  DR. WOOD/MRI  . Skin melanocytoma excision  2012   "above left clavicle"  . TEE WITHOUT CARDIOVERSION  03/22/2012   Procedure: TRANSESOPHAGEAL ECHOCARDIOGRAM (TEE);  Surgeon: Candee Furbish, MD;  Location: [facility] ENDOSCOPY;  Service: Cardiovascular;  Laterality: N/A;  . TEE WITHOUT CARDIOVERSION N/A 02/08/2016   Procedure: TRANSESOPHAGEAL ECHOCARDIOGRAM (TEE);  Surgeon: Lelon Perla, MD;  Location: [facility] ENDOSCOPY;  Service: Cardiovascular;  Laterality: N/A;    There were no vitals filed for this visit.      Subjective Assessment - 05/26/16 0946    Subjective Knee feels tight today, no pain. Felt good after last treatment. Has not tried going to the gym yet.   Currently in Pain? No/denies   Multiple Pain Sites No                         [facility] Adult PT Treatment/Exercise - 05/26/16 0001      Ambulation/Gait   Ambulation/Gait Yes  Ambulation/Gait Assistance --  CGA   Ambulation Distance (Feet) 50 Feet   Assistive device Straight cane   Gait Comments VC to take bigger step with RT foot.      Knee/Hip Exercises: Aerobic   Nustep L2 10 min     Knee/Hip Exercises: Machines for Strengthening   Total Gym Leg Press Seat 9, Bil RT>LT 45# 3x 10  Single leg 1 plate/5# on dial 10x     Knee/Hip Exercises: Standing   Forward Step Up Right;2 sets;Hand Hold: 2;Step Height: 4"  16 x2 to simulate staircase.    Forward Step Up Limitations Then at stairs 10x                  PT Short Term Goals - 05/24/16 1549      PT  SHORT TERM GOAL #3   Title right knee extension AROM in sitting -30 degress due to increase strength   Time 4   Period Weeks   Status On-going           PT Long Term Goals - 05/02/16 1721      PT LONG TERM GOAL #1   Title independent with HEP and understand how to progress himself   Time 8   Period Weeks   Status On-going     PT LONG TERM GOAL #2   Title able to stand for 25 min with pain decreased >/= 50% so he is able to cook a meal.   Period Weeks     PT LONG TERM GOAL #3   Title going up and down stairs with step to step with a cane   Time 8   Period Weeks   Status On-going     PT LONG TERM GOAL #4   Title sit on stool and scoot around office to assess patients   Time 8   Period Weeks   Status On-going     PT LONG TERM GOAL #5   Title return to household chores due to lower extremity strength >/= 4/5   Time 8   Period Weeks   Status On-going     PT LONG TERM GOAL #6   Title return to driving due to increased right lower extremity strength >/= 4/5   Time 8   Period Weeks   Status On-going     PT LONG TERM GOAL #7   Title pain decreased >/= 50% in right knee   Time 8   Period Weeks   Status On-going               Plan - 05/26/16 0947    Clinical Impression Statement Pt was able to use cane and go up the step at the stairs 10x with improved control at the knee. Pt was able to ambulate 50 feet with CGA and the cane. Knee again was stable. Pt cannot sit and extend the knee but shows improvement since eval.     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   PT Frequency 3x / week   PT Duration Other (comment)   PT Treatment/Interventions ADLs/Self Care Home Management;Cryotherapy;Electrical Stimulation;Functional mobility training;Stair training;Gait training;Ultrasound;Moist Heat;Therapeutic activities;Therapeutic exercise;Balance training;Neuromuscular  re-education;Patient/family education;Passive range of motion;Manual techniques;Energy conservation;Other (comment)   PT Next Visit Plan Step ups, leg press, gait training.    Consulted and Agree with Plan of Care Patient      Patient will benefit from skilled therapeutic intervention in order to  improve the following deficits and impairments:  Abnormal gait, Difficulty walking, Decreased endurance, Pain, Decreased activity tolerance, Decreased balance, Decreased strength, Decreased mobility  Visit Diagnosis: Muscle weakness (generalized)  Other abnormalities of gait and mobility     Problem List Patient Active Problem List   Diagnosis Date Noted  . Right ankle pain   . Pain   . Tibial pain   . Acute idiopathic gout of right ankle   . Right knee pain   . Diabetes mellitus type 2 in obese ([place])   . Labile blood pressure   . Acute blood loss anemia   . Diabetes mellitus type 2 in nonobese ([facility])   . Debility 02/22/2016  . 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 Weakness of both lower extremities   . Lower extremity weakness   . Pneumonia   . Acute lumbar back pain   . Non-traumatic rhabdomyolysis   . Retroperitoneal bleed   . Back pain   . Leg weakness, bilateral   . Paroxysmal atrial fibrillation ([place])   . Ventricular fibrillation ([place]) 02/09/2016  . Aortic aneurysm without rupture ([place]) 02/09/2016  . Aortic insufficiency 02/09/2016  . HCAP (healthcare-associated pneumonia)   . Cardiac arrest ([place]) 02/01/2016  . Acute on chronic systolic heart failure ([place])   . Acute encephalopathy   . Thyroid activity decreased   . Acute hypoxemic respiratory failure ([place])   . Acute respiratory failure ([place])   . Encounter for central line placement   . Arrhythmia   . Altered mental status   . CKD (chronic kidney disease), stage IV ([place]) 08/06/2013    Class: Chronic  . Long term (current) use of anticoagulants 08/06/2013    Class: Chronic  . Special screening  for malignant neoplasms, colon 04/11/2013  . Colon cancer screening 03/04/2013  . Chronic anticoagulation 03/04/2013  . Atrial flutter ([place]) 03/18/2012    Class: Acute  . Chronic systolic heart failure ([place]) 02/13/2012    Class: Acute  . Hypertension, accelerated 02/13/2012  . Aortic valve regurgitation, acquired 02/13/2012  . Aortic root enlargement ([place]) 02/13/2012  . Hyperlipidemia 02/13/2012  . Left bundle branch block 02/13/2012  . Obesity (BMI 30-39.9) 02/13/2012    Adrinne Sze, PTA 05/26/2016, 10:29 AM  [facility] [address] [place], [place], [address] Phone: [phone]   Fax:  [phone]  Name: Eric Mortel  MD MRN: [id number] Date of Birth: 31-Oct-1950

## 2016-05-30 ENCOUNTER — Ambulatory Visit: Payer: BC Managed Care – PPO | Admitting: Physical Therapy

## 2016-05-30 ENCOUNTER — Encounter: Payer: Self-pay | Admitting: Physical Therapy

## 2016-05-30 DIAGNOSIS — M6281 Muscle weakness (generalized): Secondary | ICD-10-CM

## 2016-05-30 DIAGNOSIS — M25561 Pain in right knee: Secondary | ICD-10-CM

## 2016-05-30 DIAGNOSIS — M545 Low back pain, unspecified: Secondary | ICD-10-CM

## 2016-05-30 DIAGNOSIS — R2689 Other abnormalities of gait and mobility: Secondary | ICD-10-CM

## 2016-05-30 NOTE — Therapy (Signed)
[facility] [address] [place], [place], [address] Phone: [phone]   Fax:  [phone]  Physical Therapy Treatment  Patient Details  Name: Eric Pepitone  MD MRN: [id number] Date of Birth: 1951-03-26 Referring Provider: Dr. Roe Coombs  Encounter Date: 05/30/2016      PT End of Session - 05/30/16 1454    Visit Number 9   Date for PT Re-Evaluation 08/26/16   Authorization Type BCBS   PT Start Time 1405   PT Stop Time 1445   PT Time Calculation (min) 40 min   Activity Tolerance Patient tolerated treatment well   Behavior During Therapy [facility] for tasks assessed/performed      Past Medical History:  Diagnosis Date  . CHF (congestive heart failure) ([place])   . Chronic kidney disease    kidney fx studies increased   . Chronic lower back pain   . Claustrophobia   . Dysrhythmia    "palpitations"  . Exertional dyspnea 01/2012  . Heart murmur   . Hypertension   . Hypothyroidism   . Migraine 02/13/12   "opthalmic"  . Varicose vein of leg    right    Past Surgical History:  Procedure Laterality Date  . CARDIAC CATHETERIZATION N/A 02/17/2016   Procedure: Left Heart Cath and Coronary Angiography;  Surgeon: Jettie Booze, MD;  Location: [place] CV LAB;  Service: Cardiovascular;  Laterality: N/A;  . CARDIOVERSION  03/22/2012   Procedure: CARDIOVERSION;  Surgeon: Candee Furbish, MD;  Location: [facility] ENDOSCOPY;  Service: Cardiovascular;  Laterality: N/A;  . CARDIOVERSION  04/19/2012   Procedure: CARDIOVERSION;  Surgeon: Sinclair Grooms, MD;  Location: [place];  Service: Cardiovascular;  Laterality: N/A;  . COLONOSCOPY N/A 04/11/2013   Procedure: COLONOSCOPY;  Surgeon: Inda Castle, MD;  Location: [facility] ENDOSCOPY;  Service: Endoscopy;  Laterality: N/A;  . COLONOSCOPY N/A 04/11/2013   Procedure: COLONOSCOPY;  Surgeon: Inda Castle, MD;  Location: [facility] ENDOSCOPY;  Service: Endoscopy;  Laterality: N/A;  . EP  IMPLANTABLE DEVICE N/A 02/17/2016   Procedure: BiV ICD Insertion CRT-D;  Surgeon: Evans Lance, MD;  Location: [place] CV LAB;  Service: Cardiovascular;  Laterality: N/A;  . FINGER SURGERY  2012   "4th digit right hand; thumb on left hand"  . RADIOLOGY WITH ANESTHESIA N/A 02/11/2016   Procedure: MRI OF THE BRAIN WITHOUT CONTRAST, LUMBAR WITHOUT CONTRAST;  Surgeon: Medication Radiologist, MD;  Location: [place];  Service: Radiology;  Laterality: N/A;  DR. WOOD/MRI  . Skin melanocytoma excision  2012   "above left clavicle"  . TEE WITHOUT CARDIOVERSION  03/22/2012   Procedure: TRANSESOPHAGEAL ECHOCARDIOGRAM (TEE);  Surgeon: Candee Furbish, MD;  Location: [facility] ENDOSCOPY;  Service: Cardiovascular;  Laterality: N/A;  . TEE WITHOUT CARDIOVERSION N/A 02/08/2016   Procedure: TRANSESOPHAGEAL ECHOCARDIOGRAM (TEE);  Surgeon: Lelon Perla, MD;  Location: [facility] ENDOSCOPY;  Service: Cardiovascular;  Laterality: N/A;    There were no vitals filed for this visit.      Subjective Assessment - 05/30/16 1420    Subjective walking better   Patient is accompained by: Family member   How long can you sit comfortably? sitting for 45 min and right leg gets numb   How long can you stand comfortably? without walker 60 sec.    How long can you walk comfortably? walks with rolling waker and pressure in right knee   Patient Stated Goals regain previous mobility.  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 cooking, making bed, washing dishes    Currently in Pain? Yes   Pain Score 6    Pain Location Knee   Pain Orientation Right   Pain Descriptors / Indicators Tightness   Pain Type Acute pain   Pain Radiating Towards down right leg   Pain Onset More than a month ago   Pain Frequency Intermittent   Aggravating Factors  sitting   Pain Relieving Factors pain medication            [facility] PT Assessment - 05/30/16 0001      AROM   Right Knee Extension -40     Ambulation/Gait    Ambulation/Gait Yes   Ambulation/Gait Assistance --  CGA   Ambulation Distance (Feet) 50 Feet  2 times   Assistive device Straight cane                     [facility] Adult PT Treatment/Exercise - 05/30/16 0001      Knee/Hip Exercises: Standing   Forward Step Up Right;2 sets;Hand Hold: 2;Step Height: 6"  16 x2 to simulate staircase.    Forward Step Up Limitations Then at stairs 10x   Other Standing Knee Exercises stand on right leg, slide left leg into abduction 20times with therapist guiding the right knee and holding support with 2 hands     Knee/Hip Exercises: Supine   Terminal Knee Extension Strengthening;Right;2 sets;10 reps  did instanding   Theraband Level (Terminal Knee Extension) Level 2 (Red)   Terminal Knee Extension Limitations felt the right quad flicker                  PT Short Term Goals - 05/30/16 1453      PT SHORT TERM GOAL #2   Title be able to go to the gym to do the bike or elliptical for improving strength   Time 4   Period Weeks   Status On-going  has not gone to the gym yet     PT SHORT TERM GOAL #3   Title right knee extension AROM in sitting -30 degress due to increase strength   Time 4   Period Weeks   Status On-going  -40     PT SHORT TERM GOAL #4   Title pain in right leg decreased >/= 25%    Time 4   Period Weeks   Status On-going  about the same     PT SHORT TERM GOAL #5   Title able to get in and out of car without helping his right leg due to increased strength   Time 4   Period Weeks   Status Achieved           PT Long Term Goals - 05/02/16 1721      PT LONG TERM GOAL #1   Title independent with HEP and understand how to progress himself   Time 8   Period Weeks   Status On-going     PT LONG TERM GOAL #2   Title able to stand for 25 min with pain decreased >/= 50% so he is able to cook a meal.   Period Weeks     PT LONG TERM GOAL #3   Title going up and down stairs with step to step with a cane    Time 8   Period Weeks   Status On-going     PT LONG TERM GOAL #4   Title sit on stool and scoot around  office to assess patients   Time 8   Period Weeks   Status On-going     PT LONG TERM GOAL #5   Title return to household chores due to lower extremity strength >/= 4/5   Time 8   Period Weeks   Status On-going     PT LONG TERM GOAL #6   Title return to driving due to increased right lower extremity strength >/= 4/5   Time 8   Period Weeks   Status On-going     PT LONG TERM GOAL #7   Title pain decreased >/= 50% in right knee   Time 8   Period Weeks   Status On-going               Plan - 05/30/16 1455    Clinical Impression Statement Patient is able to go from sit to stand with control compared to initial evaluation.  Patient is able to walk with a SPC with step to step pattern and gait belt with C.G for 50 feet 2 times.  Patient is able to step up onto a 6 inch step with right leg and less pressure on his hands for support. Therapist can feel contraction of the right quads with fluttering. Patient will benefit from skilled therapy to improve strength of legs and improve gait.    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   PT Frequency 3x / week   PT Duration Other (comment)   PT Treatment/Interventions ADLs/Self Care Home Management;Cryotherapy;Electrical Stimulation;Functional mobility training;Stair training;Gait training;Ultrasound;Moist Heat;Therapeutic activities;Therapeutic exercise;Balance training;Neuromuscular re-education;Patient/family education;Passive range of motion;Manual techniques;Energy conservation;Other (comment)   PT Next Visit Plan Step ups, leg press, gait training. closed chaing leg strengthening; adjust cane if patient has one.    PT Home Exercise Plan progress as needed   Consulted and Agree with Plan of Care Patient      Patient will benefit  from skilled therapeutic intervention in order to improve the following deficits and impairments:  Abnormal gait, Difficulty walking, Decreased endurance, Pain, Decreased activity tolerance, Decreased balance, Decreased strength, Decreased mobility  Visit Diagnosis: Muscle weakness (generalized)  Other abnormalities of gait and mobility  Pain in right knee  Left-sided low back pain without sciatica     Problem List Patient Active Problem List   Diagnosis Date Noted  . Right ankle pain   . Pain   . Tibial pain   . Acute idiopathic gout of right ankle   . Right knee pain   . Diabetes mellitus type 2 in obese ([place])   . Labile blood pressure   . Acute blood loss anemia   . Diabetes mellitus type 2 in nonobese ([facility])   . Debility 02/22/2016  . 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 Weakness of both lower extremities   . Lower extremity weakness   . Pneumonia   . Acute lumbar back pain   . Non-traumatic rhabdomyolysis   . Retroperitoneal bleed   . Back pain   . Leg weakness, bilateral   . Paroxysmal atrial fibrillation ([place])   . Ventricular fibrillation ([place]) 02/09/2016  . Aortic aneurysm without rupture ([place]) 02/09/2016  . Aortic insufficiency 02/09/2016  . HCAP (healthcare-associated pneumonia)   . Cardiac arrest ([place]) 02/01/2016  . Acute on chronic systolic heart failure ([place])   . Acute encephalopathy   . Thyroid activity decreased   . Acute hypoxemic respiratory failure ([place])   .  Acute respiratory failure ([place])   . Encounter for central line placement   . Arrhythmia   . Altered mental status   . CKD (chronic kidney disease), stage IV ([place]) 08/06/2013    Class: Chronic  . Long term (current) use of anticoagulants 08/06/2013    Class: Chronic  . Special screening for malignant neoplasms, colon 04/11/2013  . Colon cancer screening 03/04/2013  . Chronic anticoagulation 03/04/2013  . Atrial flutter ([place]) 03/18/2012    Class: Acute  . Chronic  systolic heart failure ([place]) 02/13/2012    Class: Acute  . Hypertension, accelerated 02/13/2012  . Aortic valve regurgitation, acquired 02/13/2012  . Aortic root enlargement ([place]) 02/13/2012  . Hyperlipidemia 02/13/2012  . Left bundle branch block 02/13/2012  . Obesity (BMI 30-39.9) 02/13/2012    Earlie Counts, PT 05/30/16 3:00 PM   [facility] [address] [place], [place], [address] Phone: [phone]   Fax:  [phone]  Name: Wahab Scarpinato  MD MRN: [id number] Date of Birth: 1950/11/14

## 2016-06-01 ENCOUNTER — Ambulatory Visit: Payer: BC Managed Care – PPO | Admitting: Physical Therapy

## 2016-06-01 DIAGNOSIS — M6281 Muscle weakness (generalized): Secondary | ICD-10-CM

## 2016-06-01 DIAGNOSIS — M545 Low back pain, unspecified: Secondary | ICD-10-CM

## 2016-06-01 DIAGNOSIS — R2689 Other abnormalities of gait and mobility: Secondary | ICD-10-CM

## 2016-06-01 DIAGNOSIS — M25561 Pain in right knee: Secondary | ICD-10-CM

## 2016-06-01 NOTE — Therapy (Signed)
[facility] [address], [place], [place], [address] Phone: [phone]   Fax:  [phone]  Physical Therapy Treatment  Patient Details  Name: Eric Weidinger  MD MRN: [id number] Date of Birth: 1951-03-16 Referring Provider: Dr. Roe Coombs  Encounter Date: 06/01/2016      PT End of Session - 06/01/16 1547    Visit Number 10   Date for PT Re-Evaluation 08/26/16   Authorization Type BCBS   PT Start Time 1445   PT Stop Time 1530   PT Time Calculation (min) 45 min   Activity Tolerance Patient tolerated treatment well      Past Medical History:  Diagnosis Date  . CHF (congestive heart failure) ([place])   . Chronic kidney disease    kidney fx studies increased   . Chronic lower back pain   . Claustrophobia   . Dysrhythmia    "palpitations"  . Exertional dyspnea 01/2012  . Heart murmur   . Hypertension   . Hypothyroidism   . Migraine 02/13/12   "opthalmic"  . Varicose vein of leg    right    Past Surgical History:  Procedure Laterality Date  . CARDIAC CATHETERIZATION N/A 02/17/2016   Procedure: Left Heart Cath and Coronary Angiography;  Surgeon: Jettie Booze, MD;  Location: [place] [facility];  Service: Cardiovascular;  Laterality: N/A;  . CARDIOVERSION  03/22/2012   Procedure: CARDIOVERSION;  Surgeon: Candee Furbish, MD;  Location: [facility] ENDOS[facility];  Service: Cardiovascular;  Laterality: N/A;  . CARDIOVERSION  04/19/2012   Procedure: CARDIOVERSION;  Surgeon: Sinclair Grooms, MD;  Location: [place];  Service: Cardiovascular;  Laterality: N/A;  . COLONOSCOPY N/A 04/11/2013   Procedure: COLONOSCOPY;  Surgeon: Inda Castle, MD;  Location: [facility] ENDOSCOPY;  Service: Endoscopy;  Laterality: N/A;  . COLONOSCOPY N/A 04/11/2013   Procedure: COLONOSCOPY;  Surgeon: Inda Castle, MD;  Location: [facility] ENDOSCOPY;  Service: Endoscopy;  Laterality: N/A;  . EP IMPLANTABLE DEVICE N/A 02/17/2016   Procedure: BiV ICD Insertion  CRT-D;  Surgeon: Evans Lance, MD;  Location: [place] [facility];  Service: Cardiovascular;  Laterality: N/A;  . FINGER SURGERY  2012   "4th digit right hand; thumb on left hand"  . RADIOLOGY WITH ANESTHESIA N/A 02/11/2016   Procedure: MRI OF THE BRAIN WITHOUT CONTRAST, LUMBAR WITHOUT CONTRAST;  Surgeon: Medication Radiologist, MD;  Location: [facility];  Service: Radiology;  Laterality: N/A;  DR. WOOD/MRI  . Skin melanocytoma excision  2012   "above left clavicle"  . TEE WITHOUT CARDIOVERSION  03/22/2012   Procedure: TRANSESOPHAGEAL ECHOCARDIOGRAM (TEE);  Surgeon: Candee Furbish, MD;  Location: [facility] ENDOSCOPY;  Service: Cardiovascular;  Laterality: N/A;  . TEE WITHOUT CARDIOVERSION N/A 02/08/2016   Procedure: TRANSESOPHAGEAL ECHOCARDIOGRAM (TEE);  Surgeon: Lelon Perla, MD;  Location: [facility];  Service: Cardiovascular;  Laterality: N/A;    There were no vitals filed for this visit.      Subjective Assessment - 06/01/16 1447    Subjective I had some pain earlier so I took a pain pill and it's decreasing.  Reports soreness right front thigh muscle soreness and next day knee tightness following last session but not excessively so.     Currently in Pain? Yes   Pain Score 4    Pain Location Knee   Pain Orientation Right   Pain Type Acute pain  [place] Adult PT Treatment/Exercise - 06/01/16 0001      Ambulation/Gait   Ambulation/Gait Yes   Ambulation/Gait Assistance 5: Supervision   Ambulation Distance (Feet) 50 Feet  2 times   Assistive device Straight cane   Gait Comments decreased cues for longer step length on left and to look forward      Knee/Hip Exercises: Aerobic   Nustep L2 10 min  LEs only     Knee/Hip Exercises: Standing   Hip ADduction Strengthening;Left;10 reps   Forward Step Up Right;2 sets;Hand Hold: 2;Step Height: 6"  16 x2 to simulate staircase.    SLS weight bearing on right with step taps with left 15x     Knee/Hip  Exercises: Seated   Clamshell with TheraBand Red  20x   Other Seated Knee/Hip Exercises quad set with ball squeeze 15x   Sit to Sand 10 reps  from high plinth                  PT Short Term Goals - 06/01/16 1555      PT SHORT TERM GOAL #1   Title independent with initial HEP    Status Achieved     PT SHORT TERM GOAL #2   Title be able to go to the gym to do the bike or elliptical for improving strength   Time 4   Period Weeks   Status On-going     PT SHORT TERM GOAL #3   Title right knee extension AROM in sitting -30 degress due to increase strength   Time 4   Period Weeks   Status On-going     PT SHORT TERM GOAL #4   Title pain in right leg decreased >/= 25%    Time 4   Period Weeks   Status On-going     PT SHORT TERM GOAL #5   Title able to get in and out of car without helping his right leg due to increased strength   Status Achieved           PT Long Term Goals - 06/01/16 1556      PT LONG TERM GOAL #1   Title independent with HEP and understand how to progress himself   Time 8   Period Weeks   Status On-going     PT LONG TERM GOAL #2   Title able to stand for 25 min with pain decreased >/= 50% so he is able to cook a meal.   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   Time 8   Period Weeks   Status On-going     PT LONG TERM GOAL #5   Title return to household chores due to lower extremity strength >/= 4/5   Time 8   Period Weeks   Status On-going     PT LONG TERM GOAL #6   Title return to driving due to increased right lower extremity strength >/= 4/5   Time 8   Period Weeks   Status On-going     PT LONG TERM GOAL #7   Title pain decreased >/= 50% in right knee   Time 8  Period Weeks   Status On-going                Plan - 06/01/16 1548    Clinical Impression Statement The patient is progressing with weight bearing through right LE with progession from RW to single point cane.  Decreased cues needed for step length but patient has difficulty not watching his feet for sequencing.  Decreased gait speed and endurance (fatigues with approx 50 feet).  Patient needs several rest breaks (sitting) during treatment session.  Therapist providing close supervision in standing for safety.    PT Next Visit Plan Step ups, leg press, gait training. closed chaing leg strengthening; adjust cane if patient has one.       Patient will benefit from skilled therapeutic intervention in order to improve the following deficits and impairments:     Visit Diagnosis: Muscle weakness (generalized)  Other abnormalities of gait and mobility  Pain in right knee  Left-sided low back pain without sciatica     Problem List Patient Active Problem List   Diagnosis Date Noted  . Right ankle pain   . Pain   . Tibial pain   . Acute idiopathic gout of right ankle   . Right knee pain   . Diabetes mellitus type 2 in obese ([place])   . Labile blood pressure   . Acute blood loss anemia   . Diabetes mellitus type 2 in nonobese ([facility])   . Debility 02/22/2016  . 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 Weakness of both lower extremities   . Lower extremity weakness   . Pneumonia   . Acute lumbar back pain   . Non-traumatic rhabdomyolysis   . Retroperitoneal bleed   . Back pain   . Leg weakness, bilateral   . Paroxysmal atrial fibrillation ([place])   . Ventricular fibrillation ([place]) 02/09/2016  . Aortic aneurysm without rupture ([place]) 02/09/2016  . Aortic insufficiency 02/09/2016  . HCAP (healthcare-associated pneumonia)   . Cardiac arrest ([place]) 02/01/2016  . Acute on chronic systolic heart failure ([place])   . Acute encephalopathy   . Thyroid activity decreased   . Acute hypoxemic respiratory  failure ([place])   . Acute respiratory failure ([place])   . Encounter for central line placement   . Arrhythmia   . Altered mental status   . CKD (chronic kidney disease), stage IV ([place]) 08/06/2013    Class: Chronic  . Long term (current) use of anticoagulants 08/06/2013    Class: Chronic  . Special screening for malignant neoplasms, colon 04/11/2013  . Colon cancer screening 03/04/2013  . Chronic anticoagulation 03/04/2013  . Atrial flutter ([place]) 03/18/2012    Class: Acute  . Chronic systolic heart failure ([place]) 02/13/2012    Class: Acute  . Hypertension, accelerated 02/13/2012  . Aortic valve regurgitation, acquired 02/13/2012  . Aortic root enlargement ([place]) 02/13/2012  . Hyperlipidemia 02/13/2012  . Left bundle branch block 02/13/2012  . Obesity (BMI 30-39.9) 02/13/2012   Ruben Im, PT 06/01/16 3:58 PM Phone: [phone] Fax: [phone]  Alvera Singh 06/01/2016, 3:58 PM  [facility] [address], [place], [place], [address] Phone: [phone]   Fax:  [phone]  Name: Krishawn Vanderweele  MD MRN: [id number] Date of Birth: 07-Dec-1950

## 2016-06-05 ENCOUNTER — Ambulatory Visit: Payer: BC Managed Care – PPO | Admitting: Physical Therapy

## 2016-06-05 ENCOUNTER — Encounter: Payer: Self-pay | Admitting: Physical Therapy

## 2016-06-05 DIAGNOSIS — M6281 Muscle weakness (generalized): Secondary | ICD-10-CM

## 2016-06-05 DIAGNOSIS — R2689 Other abnormalities of gait and mobility: Secondary | ICD-10-CM

## 2016-06-05 DIAGNOSIS — S7410XD Injury of femoral nerve at hip and thigh level, unspecified leg, subsequent encounter: Secondary | ICD-10-CM

## 2016-06-05 DIAGNOSIS — M25561 Pain in right knee: Secondary | ICD-10-CM

## 2016-06-05 NOTE — Therapy (Signed)
[facility] [address], [place], [place], [address] Phone: [phone]   Fax:  [phone]  Physical Therapy Treatment  Patient Details  Name: Eric Torti  MD MRN: [id number] Date of Birth: 1950-11-01 Referring Provider: Dr. Roe Coombs  Encounter Date: 06/05/2016      PT End of Session - 06/05/16 1411    Visit Number 11   Date for PT Re-Evaluation 08/26/16   Authorization Type BCBS   PT Start Time 1407   PT Stop Time 1445   PT Time Calculation (min) 38 min   Activity Tolerance Patient tolerated treatment well   Behavior During Therapy [facility] for tasks assessed/performed      Past Medical History:  Diagnosis Date  . CHF (congestive heart failure) ([place])   . Chronic kidney disease    kidney fx studies increased   . Chronic lower back pain   . Claustrophobia   . Dysrhythmia    "palpitations"  . Exertional dyspnea 01/2012  . Heart murmur   . Hypertension   . Hypothyroidism   . Migraine 02/13/12   "opthalmic"  . Varicose vein of leg    right    Past Surgical History:  Procedure Laterality Date  . CARDIAC CATHETERIZATION N/A 02/17/2016   Procedure: Left Heart Cath and Coronary Angiography;  Surgeon: Jettie Booze, MD;  Location: [place] [facility];  Service: Cardiovascular;  Laterality: N/A;  . CARDIOVERSION  03/22/2012   Procedure: CARDIOVERSION;  Surgeon: Candee Furbish, MD;  Location: [facility] ENDOSCOPY;  Service: Cardiovascular;  Laterality: N/A;  . CARDIOVERSION  04/19/2012   Procedure: CARDIOVERSION;  Surgeon: Sinclair Grooms, MD;  Location: [facility];  Service: Cardiovascular;  Laterality: N/A;  . COLONOSCOPY N/A 04/11/2013   Procedure: COLONOSCOPY;  Surgeon: Inda Castle, MD;  Location: [facility];  Service: Endoscopy;  Laterality: N/A;  . COLONOSCOPY N/A 04/11/2013   Procedure: COLONOSCOPY;  Surgeon: Inda Castle, MD;  Location: [facility]OS[facility];  Service: Endoscopy;  Laterality: N/A;  . EP  IMPLANTABLE DEVICE N/A 02/17/2016   Procedure: BiV ICD Insertion CRT-D;  Surgeon: Evans Lance, MD;  Location: [place];  Service: Cardiovascular;  Laterality: N/A;  . FINGER SURGERY  2012   "4th digit right hand; thumb on left hand"  . RADIOLOGY WITH ANESTHESIA N/A 02/11/2016   Procedure: MRI OF THE BRAIN WITHOUT CONTRAST, LUMBAR WITHOUT CONTRAST;  Surgeon: Medication Radiologist, MD;  Location: [facility];  Service: Radiology;  Laterality: N/A;  DR. WOOD/MRI  . Skin melanocytoma excision  2012   "above left clavicle"  . TEE WITHOUT CARDIOVERSION  03/22/2012   Procedure: TRANSESOPHAGEAL ECHOCARDIOGRAM (TEE);  Surgeon: Candee Furbish, MD;  Location: [facility] ENDOSCOPY;  Service: Cardiovascular;  Laterality: N/A;  . TEE WITHOUT CARDIOVERSION N/A 02/08/2016   Procedure: TRANSESOPHAGEAL ECHOCARDIOGRAM (TEE);  Surgeon: Lelon Perla, MD;  Location: [facility] ENDOSCOPY;  Service: Cardiovascular;  Laterality: N/A;    There were no vitals filed for this visit.      Subjective Assessment - 06/05/16 1409    Subjective Walked with cane all weekend. I can see a little motion in the vastis lateralis. Knee is a little sore.    Currently in Pain? Yes   Pain Score 5    Pain Location Knee   Pain Orientation Right   Pain Descriptors / Indicators Dull;Aching;Tightness   Aggravating Factors  Sitting   Pain Relieving Factors Pain meds   Multiple Pain Sites No  [place] Adult PT Treatment/Exercise - 06/05/16 0001      Knee/Hip Exercises: Aerobic   Nustep L2-3 x 10 min     Knee/Hip Exercises: Machines for Strengthening   Total Gym Leg Press seat 9: Bil 45#x10 50# 2x 10 Rt > LT, RT only first plate +10 2I94     Knee/Hip Exercises: Standing   Forward Step Up Right;2 sets;Hand Hold: 2;Step Height: 6"  16 x2 to simulate staircase.    Forward Step Up Limitations then only  LT UE 6x   more difficult   SLS On RTLE tapping LTLE on stair 2 x10   VC to "feel" the quad      Knee/Hip Exercises: Seated   Ball Squeeze 20x   Sit to [employer] 10 reps;with UE support  from low mat                  PT Short Term Goals - 06/05/16 1413      PT SHORT TERM GOAL #2   Title be able to go to the gym to do the bike or elliptical for improving strength   Time 4   Period Weeks   Status On-going  May start soon     PT SHORT TERM GOAL #4   Title pain in right leg decreased >/= 25%    Time 4   Period Weeks   Status On-going  Intermittent 15%           PT Long Term Goals - 06/01/16 1556      PT LONG TERM GOAL #1   Title independent with HEP and understand how to progress himself   Time 8   Period Weeks   Status On-going     PT LONG TERM GOAL #2   Title able to stand for 25 min with pain decreased >/= 50% so he is able to cook a meal.   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   Time 8   Period Weeks   Status On-going     PT LONG TERM GOAL #5   Title return to household chores due to lower extremity strength >/= 4/5   Time 8   Period Weeks   Status On-going     PT LONG TERM GOAL #6   Title return to driving due to increased right lower extremity strength >/= 4/5   Time 8   Period Weeks   Status On-going     PT LONG TERM GOAL #7   Title pain decreased >/= 50% in right knee   Time 8   Period Weeks   Status On-going               Plan - 06/05/16 1412    Clinical Impression Statement Pt walking with cane 100% of the time now, was able to increase his weight on th leg press with good control. Gait was good with nice swing through with his LE.  Still has difficulty going up a step with 1 rail.    Rehab Potential Good   Clinical Impairments Affecting Rehab Potential bilateral retroperitioneal hemorrhage with  femoral nerve involvement   PT Frequency  3x / week   PT Duration Other (comment)   PT Treatment/Interventions ADLs/Self Care Home Management;Cryotherapy;Electrical Stimulation;Functional mobility training;Stair training;Gait training;Ultrasound;Moist Heat;Therapeutic activities;Therapeutic exercise;Balance training;Neuromuscular re-education;Patient/family education;Passive range of motion;Manual techniques;Energy conservation;Other (comment)   PT Next Visit Plan Step ups, leg press, gait training. closed chaing leg strengthening; adjust cane if patient has one.    Consulted and Agree with Plan of Care Patient      Patient will benefit from skilled therapeutic intervention in order to improve the following deficits and impairments:  Abnormal gait, Difficulty walking, Decreased endurance, Pain, Decreased activity tolerance, Decreased balance, Decreased strength, Decreased mobility  Visit Diagnosis: Muscle weakness (generalized)  Pain in right knee  Other abnormalities of gait and mobility  Femoral nerve injury, unspecified laterality, subsequent encounter     Problem List Patient Active Problem List   Diagnosis Date Noted  . Right ankle pain   . Pain   . Tibial pain   . Acute idiopathic gout of right ankle   . Right knee pain   . Diabetes mellitus type 2 in obese ([place])   . Labile blood pressure   . Acute blood loss anemia   . Diabetes mellitus type 2 in nonobese ([facility])   . Debility 02/22/2016  . 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 Weakness of both lower extremities   . Lower extremity weakness   . Pneumonia   . Acute lumbar back pain   . Non-traumatic rhabdomyolysis   . Retroperitoneal bleed   . Back pain   . Leg weakness, bilateral   . Paroxysmal atrial fibrillation ([place])   . Ventricular fibrillation ([place]) 02/09/2016  . Aortic aneurysm without rupture ([place]) 02/09/2016  . Aortic insufficiency 02/09/2016  . HCAP (healthcare-associated pneumonia)    . Cardiac arrest ([place]) 02/01/2016  . Acute on chronic systolic heart failure ([place])   . Acute encephalopathy   . Thyroid activity decreased   . Acute hypoxemic respiratory failure ([place])   . Acute respiratory failure ([place])   . Encounter for central line placement   . Arrhythmia   . Altered mental status   . CKD (chronic kidney disease), stage IV ([place]) 08/06/2013    Class: Chronic  . Long term (current) use of anticoagulants 08/06/2013    Class: Chronic  . Special screening for malignant neoplasms, colon 04/11/2013  . Colon cancer screening 03/04/2013  . Chronic anticoagulation 03/04/2013  . Atrial flutter ([place]) 03/18/2012    Class: Acute  . Chronic systolic heart failure ([place]) 02/13/2012    Class: Acute  . Hypertension, accelerated 02/13/2012  . Aortic valve regurgitation, acquired 02/13/2012  . Aortic root enlargement ([place]) 02/13/2012  . Hyperlipidemia 02/13/2012  . Left bundle branch block 02/13/2012  . Obesity (BMI 30-39.9) 02/13/2012    Reverie Vaquera, PTA 06/05/2016, 2:45 PM  [facility] [address] [place], [place], [address] Phone: [phone]   Fax:  [phone]  Name: Eric Sciascia  MD MRN: [id number] Date of Birth: 08-12-1951

## 2016-06-07 ENCOUNTER — Encounter: Payer: Self-pay | Admitting: Physical Therapy

## 2016-06-07 ENCOUNTER — Ambulatory Visit: Payer: BC Managed Care – PPO | Admitting: Physical Therapy

## 2016-06-07 DIAGNOSIS — S7410XD Injury of femoral nerve at hip and thigh level, unspecified leg, subsequent encounter: Secondary | ICD-10-CM

## 2016-06-07 DIAGNOSIS — M6281 Muscle weakness (generalized): Secondary | ICD-10-CM

## 2016-06-07 DIAGNOSIS — M25561 Pain in right knee: Secondary | ICD-10-CM

## 2016-06-07 DIAGNOSIS — R2689 Other abnormalities of gait and mobility: Secondary | ICD-10-CM

## 2016-06-07 NOTE — Therapy (Signed)
[facility] [address], [place], [place], [address] Phone: [phone]   Fax:  [phone]  Physical Therapy Treatment  Patient Details  Name: Eric Sebesta  MD MRN: [id number] Date of Birth: Jan 29, 1951 Referring Provider: Dr. Roe Coombs  Encounter Date: 06/07/2016      PT End of Session - 06/07/16 1415    Visit Number 12   Date for PT Re-Evaluation 08/26/16   Authorization Type BCBS   PT Start Time 1410  Pt late   PT Stop Time 1447   PT Time Calculation (min) 37 min   Activity Tolerance Patient tolerated treatment well   Behavior During Therapy [facility] for tasks assessed/performed      Past Medical History:  Diagnosis Date  . CHF (congestive heart failure) ([place])   . Chronic kidney disease    kidney fx studies increased   . Chronic lower back pain   . Claustrophobia   . Dysrhythmia    "palpitations"  . Exertional dyspnea 01/2012  . Heart murmur   . Hypertension   . Hypothyroidism   . Migraine 02/13/12   "opthalmic"  . Varicose vein of leg    right    Past Surgical History:  Procedure Laterality Date  . CARDIAC CATHETERIZATION N/A 02/17/2016   Procedure: Left Heart Cath and Coronary Angiography;  Surgeon: Jettie Booze, MD;  Location: [place] [facility];  Service: Cardiovascular;  Laterality: N/A;  . CARDIOVERSION  03/22/2012   Procedure: CARDIOVERSION;  Surgeon: Candee Furbish, MD;  Location: [facility] ENDOSCOPY;  Service: Cardiovascular;  Laterality: N/A;  . CARDIOVERSION  04/19/2012   Procedure: CARDIOVERSION;  Surgeon: Sinclair Grooms, MD;  Location: [place];  Service: Cardiovascular;  Laterality: N/A;  . COLONOSCOPY N/A 04/11/2013   Procedure: COLONOSCOPY;  Surgeon: Inda Castle, MD;  Location: [facility] ENDOSCOPY;  Service: Endoscopy;  Laterality: N/A;  . COLONOSCOPY N/A 04/11/2013   Procedure: COLONOSCOPY;  Surgeon: Inda Castle, MD;  Location: [facility] ENDOSCOPY;  Service: Endoscopy;  Laterality: N/A;  .  EP IMPLANTABLE DEVICE N/A 02/17/2016   Procedure: BiV ICD Insertion CRT-D;  Surgeon: Evans Lance, MD;  Location: [place] [facility];  Service: Cardiovascular;  Laterality: N/A;  . FINGER SURGERY  2012   "4th digit right hand; thumb on left hand"  . RADIOLOGY WITH ANESTHESIA N/A 02/11/2016   Procedure: MRI OF THE BRAIN WITHOUT CONTRAST, LUMBAR WITHOUT CONTRAST;  Surgeon: Medication Radiologist, MD;  Location: [place];  Service: Radiology;  Laterality: N/A;  DR. WOOD/MRI  . Skin melanocytoma excision  2012   "above left clavicle"  . TEE WITHOUT CARDIOVERSION  03/22/2012   Procedure: TRANSESOPHAGEAL ECHOCARDIOGRAM (TEE);  Surgeon: Candee Furbish, MD;  Location: [facility]COPY;  Service: Cardiovascular;  Laterality: N/A;  . TEE WITHOUT CARDIOVERSION N/A 02/08/2016   Procedure: TRANSESOPHAGEAL ECHOCARDIOGRAM (TEE);  Surgeon: Lelon Perla, MD;  Location: [facility] ENDOSCOPY;  Service: Cardiovascular;  Laterality: N/A;    There were no vitals filed for this visit.      Subjective Assessment - 06/07/16 1413    Subjective No new complaints. Stiff the day after PT.   Currently in Pain? No/denies   Multiple Pain Sites No                         [facility] Adult PT Treatment/Exercise - 06/07/16 0001      Knee/Hip Exercises: Aerobic   Nustep L2-3 x 10 min     Knee/Hip  Exercises: Machines for Strengthening   Total Gym Leg Press Seat 9: 50# x 10 55# 2x 10, RTLE only first plate + 40# on dial 3 x10      Knee/Hip Exercises: Standing   Forward Step Up Right;2 sets;Hand Hold: 2;Step Height: 6"  16 x2 to simulate staircase.    Forward Step Up Limitations then only  LT UE 8x   more difficult   SLS On RTLE tapping LTLE on stair 2 x15   VC to "feel" the quad     Knee/Hip Exercises: Seated   Ball Squeeze 25x                  PT Short Term Goals - 06/05/16 1413      PT SHORT TERM GOAL #2   Title be able to go to the gym to do the bike or elliptical for improving strength   Time 4    Period Weeks   Status On-going  May start soon     PT SHORT TERM GOAL #4   Title pain in right leg decreased >/= 25%    Time 4   Period Weeks   Status On-going  Intermittent 15%           PT Long Term Goals - 06/01/16 1556      PT LONG TERM GOAL #1   Title independent with HEP and understand how to progress himself   Time 8   Period Weeks   Status On-going     PT LONG TERM GOAL #2   Title able to stand for 25 min with pain decreased >/= 50% so he is able to cook a meal.   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   Time 8   Period Weeks   Status On-going     PT LONG TERM GOAL #5   Title return to household chores due to lower extremity strength >/= 4/5   Time 8   Period Weeks   Status On-going     PT LONG TERM GOAL #6   Title return to driving due to increased right lower extremity strength >/= 4/5   Time 8   Period Weeks   Status On-going     PT LONG TERM GOAL #7   Title pain decreased >/= 50% in right knee   Time 8   Period Weeks   Status On-going               Plan - 06/07/16 1416    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   PT Frequency 3x / week   PT Duration Other (comment)   PT Treatment/Interventions ADLs/Self Care Home Management;Cryotherapy;Electrical Stimulation;Functional mobility training;Stair training;Gait training;Ultrasound;Moist Heat;Therapeutic activities;Therapeutic exercise;Balance training;Neuromuscular re-education;Patient/family education;Passive range of motion;Manual techniques;Energy conservation;Other (comment)   Consulted and Agree with Plan of Care Patient      Patient will benefit from skilled  therapeutic intervention in order to improve the following deficits and impairments:  Abnormal gait, Difficulty walking, Decreased endurance, Pain, Decreased activity tolerance, Decreased balance, Decreased strength, Decreased mobility  Visit Diagnosis: Muscle  weakness (generalized)  Pain in right knee  Other abnormalities of gait and mobility  Femoral nerve injury, unspecified laterality, subsequent encounter     Problem List Patient Active Problem List   Diagnosis Date Noted  . Right ankle pain   . Pain   . Tibial pain   . Acute idiopathic gout of right ankle   . Right knee pain   . Diabetes mellitus type 2 in obese ([place])   . Labile blood pressure   . Acute blood loss anemia   . Diabetes mellitus type 2 in nonobese ([facility])   . Debility 02/22/2016  . 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 Weakness of both lower extremities   . Lower extremity weakness   . Pneumonia   . Acute lumbar back pain   . Non-traumatic rhabdomyolysis   . Retroperitoneal bleed   . Back pain   . Leg weakness, bilateral   . Paroxysmal atrial fibrillation ([place])   . Ventricular fibrillation ([place]) 02/09/2016  . Aortic aneurysm without rupture ([place]) 02/09/2016  . Aortic insufficiency 02/09/2016  . HCAP (healthcare-associated pneumonia)   . Cardiac arrest ([place]) 02/01/2016  . Acute on chronic systolic heart failure ([place])   . Acute encephalopathy   . Thyroid activity decreased   . Acute hypoxemic respiratory failure ([place])   . Acute respiratory failure ([place])   . Encounter for central line placement   . Arrhythmia   . Altered mental status   . CKD (chronic kidney disease), stage IV ([place]) 08/06/2013    Class: Chronic  . Long term (current) use of anticoagulants 08/06/2013    Class: Chronic  . Special screening for malignant neoplasms, colon 04/11/2013  . Colon cancer screening 03/04/2013  . Chronic anticoagulation 03/04/2013  . Atrial flutter ([place]) 03/18/2012    Class: Acute  .  Chronic systolic heart failure ([place]) 02/13/2012    Class: Acute  . Hypertension, accelerated 02/13/2012  . Aortic valve regurgitation, acquired 02/13/2012  . Aortic root enlargement ([place]) 02/13/2012  . Hyperlipidemia 02/13/2012  . Left bundle branch block 02/13/2012  . Obesity (BMI 30-39.9) 02/13/2012    Dekayla Prestridge, PTA 06/07/2016, 2:46 PM  [facility] [address], [place], [place], [address] Phone: [phone]   Fax:  [phone]  Name: Eric Stanbery  MD MRN: [id number] Date of Birth: March 26, 1951

## 2016-06-09 ENCOUNTER — Ambulatory Visit: Payer: BC Managed Care – PPO | Admitting: Physical Therapy

## 2016-06-09 DIAGNOSIS — M6281 Muscle weakness (generalized): Secondary | ICD-10-CM

## 2016-06-09 DIAGNOSIS — S7410XD Injury of femoral nerve at hip and thigh level, unspecified leg, subsequent encounter: Secondary | ICD-10-CM

## 2016-06-09 DIAGNOSIS — M25561 Pain in right knee: Secondary | ICD-10-CM

## 2016-06-09 DIAGNOSIS — R2689 Other abnormalities of gait and mobility: Secondary | ICD-10-CM

## 2016-06-09 NOTE — Therapy (Signed)
[facility] [address] [place], [place], [address] Phone: [phone]   Fax:  [phone]  Physical Therapy Treatment  Patient Details  Name: Eric Eliot  MD MRN: [id number] Date of Birth: [birth year] Referring Provider: Dr. Roe Coombs  Encounter Date: 06/09/2016      PT End of Session - 06/09/16 1103    Visit Number 13   Date for PT Re-Evaluation 08/26/16   Authorization Type BCBS   PT Start Time 1031   PT Stop Time 1115   PT Time Calculation (min) 44 min   Activity Tolerance Patient tolerated treatment well      Past Medical History:  Diagnosis Date  . CHF (congestive heart failure) ([place])   . Chronic kidney disease    kidney fx studies increased   . Chronic lower back pain   . Claustrophobia   . Dysrhythmia    "palpitations"  . Exertional dyspnea 01/2012  . Heart murmur   . Hypertension   . Hypothyroidism   . Migraine 02/13/12   "opthalmic"  . Varicose vein of leg    right    Past Surgical History:  Procedure Laterality Date  . CARDIAC CATHETERIZATION N/A 02/17/2016   Procedure: Left Heart Cath and Coronary Angiography;  Surgeon: Jettie Booze, MD;  Location: [place] [facility];  Service: Cardiovascular;  Laterality: N/A;  . CARDIOVERSION  03/22/2012   Procedure: CARDIOVERSION;  Surgeon: Candee Furbish, MD;  Location: [facility] ENDOSCOPY;  Service: Cardiovascular;  Laterality: N/A;  . CARDIOVERSION  04/19/2012   Procedure: CARDIOVERSION;  Surgeon: Sinclair Grooms, MD;  Location: [place];  Service: Cardiovascular;  Laterality: N/A;  . COLONOSCOPY N/A 04/11/2013   Procedure: COLONOSCOPY;  Surgeon: Inda Castle, MD;  Location: [facility] ENDOSCOPY;  Service: Endoscopy;  Laterality: N/A;  . COLONOSCOPY N/A 04/11/2013   Procedure: COLONOSCOPY;  Surgeon: Inda Castle, MD;  Location: [facility] ENDOSCOPY;  Service: Endoscopy;  Laterality: N/A;  . EP IMPLANTABLE DEVICE N/A 02/17/2016   Procedure: BiV ICD Insertion  CRT-D;  Surgeon: Evans Lance, MD;  Location: [place] [facility];  Service: Cardiovascular;  Laterality: N/A;  . FINGER SURGERY  2012   "4th digit right hand; thumb on left hand"  . RADIOLOGY WITH ANESTHESIA N/A 02/11/2016   Procedure: MRI OF THE BRAIN WITHOUT CONTRAST, LUMBAR WITHOUT CONTRAST;  Surgeon: Medication Radiologist, MD;  Location: [place];  Service: Radiology;  Laterality: N/A;  DR. WOOD/MRI  . Skin melanocytoma excision  2012   "above left clavicle"  . TEE WITHOUT CARDIOVERSION  03/22/2012   Procedure: TRANSESOPHAGEAL ECHOCARDIOGRAM (TEE);  Surgeon: Candee Furbish, MD;  Location: [facility] ENDOSCOPY;  Service: Cardiovascular;  Laterality: N/A;  . TEE WITHOUT CARDIOVERSION N/A 02/08/2016   Procedure: TRANSESOPHAGEAL ECHOCARDIOGRAM (TEE);  Surgeon: Lelon Perla, MD;  Location: [facility] ENDOSCOPY;  Service: Cardiovascular;  Laterality: N/A;    There were no vitals filed for this visit.      Subjective Assessment - 06/09/16 1043    Subjective Some stiffness.  Patient reports his right thigh sensation is returning.  Using [facility] majority of the time.  Ascending steps leading with left since only has 1 railing at home.     Currently in Pain? Yes   Pain Score 4    Pain Location Knee                         [facility] Adult PT Treatment/Exercise - 06/09/16 0001  Knee/Hip Exercises: Aerobic   Nustep L2-3 x 10 min     Knee/Hip Exercises: Machines for Strengthening   Total Gym Leg Press Seat 9: 50# x 10 55# 2x 10, RTLE only first plate + 09# on dial 3 x10      Knee/Hip Exercises: Standing   Forward Step Up Right;2 sets;Hand Hold: 2;Step Height: 6"  16 x2 to simulate staircase.    SLS with Vectors 3 ways WB on left 2x 7   Other Standing Knee Exercises retro stepping 15x with Bilateral UE support     Knee/Hip Exercises: Seated   Knee/Hip Flexion long sit quad set                  PT Short Term Goals - 06/09/16 1125      PT SHORT TERM GOAL #1   Title independent  with initial HEP    Status Achieved     PT SHORT TERM GOAL #2   Title be able to go to the gym to do the bike or elliptical for improving strength   Time 4   Period Weeks   Status On-going     PT SHORT TERM GOAL #3   Title right knee extension AROM in sitting -30 degress due to increase strength   Time 4   Period Weeks   Status On-going     PT SHORT TERM GOAL #4   Title pain in right leg decreased >/= 25%    Time 4   Period Weeks   Status On-going     PT SHORT TERM GOAL #5   Title able to get in and out of car without helping his right leg due to increased strength   Status Achieved           PT Long Term Goals - 06/09/16 1125      PT LONG TERM GOAL #1   Title independent with HEP and understand how to progress himself   Time 8   Period Weeks   Status On-going     PT LONG TERM GOAL #2   Title able to stand for 25 min with pain decreased >/= 50% so he is able to cook a meal.   Time 8   Period Weeks   Status On-going     PT LONG TERM GOAL #3   Title going up and down stairs with step to step with a cane   Time 8   Period Weeks     PT LONG TERM GOAL #4   Title sit on stool and scoot around office to assess patients   Time 8   Period Weeks   Status On-going     PT LONG TERM GOAL #5   Title return to household chores due to lower extremity strength >/= 4/5   Period Weeks   Status On-going     PT LONG TERM GOAL #6   Title return to driving due to increased right lower extremity strength >/= 4/5   Time 8   Period Weeks   Status On-going     PT LONG TERM GOAL #7   Title pain decreased >/= 50% in right knee   Time 8   Period Weeks   Status On-going               Plan - 06/09/16 1122    Clinical Impression Statement Patient arrives 16 min late.  States he is using the cane 100% of the time.  Needs significant UE use  to ascend steps.  Improved activation of vastus lateralis and medialis.  Verbal and tactile cues to avoid knee hyperextension in  standing.  Therapist closely monitoring for safety and to avoid excessive overfatigue of muscles.    PT Next Visit Plan Closed chain RTLE exercises, leg press, Nustep      Patient will benefit from skilled therapeutic intervention in order to improve the following deficits and impairments:     Visit Diagnosis: Muscle weakness (generalized)  Pain in right knee  Other abnormalities of gait and mobility  Femoral nerve injury, unspecified laterality, subsequent encounter     Problem List Patient Active Problem List   Diagnosis Date Noted  . Right ankle pain   . Pain   . Tibial pain   . Acute idiopathic gout of right ankle   . Right knee pain   . Diabetes mellitus type 2 in obese ([place])   . Labile blood pressure   . Acute blood loss anemia   . Diabetes mellitus type 2 in nonobese ([facility])   . Debility 02/22/2016  . 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 Weakness of both lower extremities   . Lower extremity weakness   . Pneumonia   . Acute lumbar back pain   . Non-traumatic rhabdomyolysis   . Retroperitoneal bleed   . Back pain   . Leg weakness, bilateral   . Paroxysmal atrial fibrillation ([place])   . Ventricular fibrillation ([place]) 02/09/2016  . Aortic aneurysm without rupture ([place]) 02/09/2016  . Aortic insufficiency 02/09/2016  . HCAP (healthcare-associated pneumonia)   . Cardiac arrest ([place]) 02/01/2016  . Acute on chronic systolic heart failure ([place])   . Acute encephalopathy   . Thyroid activity decreased   . Acute hypoxemic respiratory failure ([place])   . Acute respiratory failure ([place])   . Encounter for central line placement   . Arrhythmia   . Altered mental status   . CKD (chronic kidney disease), stage IV ([place]) 08/06/2013    Class: Chronic  . Long term (current) use of anticoagulants 08/06/2013    Class: Chronic  . Special screening for malignant neoplasms, colon 04/11/2013  . Colon cancer screening 03/04/2013  . Chronic anticoagulation  03/04/2013  . Atrial flutter ([place]) 03/18/2012    Class: Acute  . Chronic systolic heart failure ([place]) 02/13/2012    Class: Acute  . Hypertension, accelerated 02/13/2012  . Aortic valve regurgitation, acquired 02/13/2012  . Aortic root enlargement ([place]) 02/13/2012  . Hyperlipidemia 02/13/2012  . Left bundle branch block 02/13/2012  . Obesity (BMI 30-39.9) 02/13/2012   Ruben Im, PT 06/09/16 11:52 AM Phone: [phone] Fax: [phone]  Alvera Singh 06/09/2016, 11:51 AM  [facility] [address] [place], [place], [address] Phone: [phone]   Fax:  [phone]  Name: Traylen Eckels  MD MRN: [id number] Date of Birth: [birth year]

## 2016-06-12 ENCOUNTER — Ambulatory Visit: Payer: BC Managed Care – PPO | Admitting: Physical Therapy

## 2016-06-12 ENCOUNTER — Encounter: Payer: Self-pay | Admitting: Physical Therapy

## 2016-06-12 DIAGNOSIS — M6281 Muscle weakness (generalized): Secondary | ICD-10-CM

## 2016-06-12 DIAGNOSIS — M25561 Pain in right knee: Secondary | ICD-10-CM

## 2016-06-12 NOTE — Therapy (Signed)
[facility] [address], [place], [place], [address] Phone: [phone]   Fax:  [phone]  Physical Therapy Treatment  Patient Details  Name: Eric Orlich  MD MRN: [id number] Date of Birth: 1950/12/04 Referring Provider: Dr. Roe Coombs  Encounter Date: 06/12/2016      PT End of Session - 06/12/16 1450    Visit Number 14   Date for PT Re-Evaluation 08/26/16   Authorization Type BCBS   PT Start Time 1447   PT Stop Time 1526   PT Time Calculation (min) 39 min   Activity Tolerance Patient tolerated treatment well   Behavior During Therapy [facility] for tasks assessed/performed      Past Medical History:  Diagnosis Date  . CHF (congestive heart failure) ([place])   . Chronic kidney disease    kidney fx studies increased   . Chronic lower back pain   . Claustrophobia   . Dysrhythmia    "palpitations"  . Exertional dyspnea 01/2012  . Heart murmur   . Hypertension   . Hypothyroidism   . Migraine 02/13/12   "opthalmic"  . Varicose vein of leg    right    Past Surgical History:  Procedure Laterality Date  . CARDIAC CATHETERIZATION N/A 02/17/2016   Procedure: Left Heart Cath and Coronary Angiography;  Surgeon: Jettie Booze, MD;  Location: [place] [facility];  Service: Cardiovascular;  Laterality: N/A;  . CARDIOVERSION  03/22/2012   Procedure: CARDIOVERSION;  Surgeon: Candee Furbish, MD;  Location: [facility] ENDOSCOPY;  Service: Cardiovascular;  Laterality: N/A;  . CARDIOVERSION  04/19/2012   Procedure: CARDIOVERSION;  Surgeon: Sinclair Grooms, MD;  Location: [place];  Service: Cardiovascular;  Laterality: N/A;  . COLONOSCOPY N/A 04/11/2013   Procedure: COLONOSCOPY;  Surgeon: Inda Castle, MD;  Location: [facility] ENDOSCOPY;  Service: Endoscopy;  Laterality: N/A;  . COLONOSCOPY N/A 04/11/2013   Procedure: COLONOSCOPY;  Surgeon: Inda Castle, MD;  Location: [facility] ENDOSCOPY;  Service: Endoscopy;  Laterality: N/A;  . EP  IMPLANTABLE DEVICE N/A 02/17/2016   Procedure: BiV ICD Insertion CRT-D;  Surgeon: Evans Lance, MD;  Location: [place] [facility];  Service: Cardiovascular;  Laterality: N/A;  . FINGER SURGERY  2012   "4th digit right hand; thumb on left hand"  . RADIOLOGY WITH ANESTHESIA N/A 02/11/2016   Procedure: MRI OF THE BRAIN WITHOUT CONTRAST, LUMBAR WITHOUT CONTRAST;  Surgeon: Medication Radiologist, MD;  Location: [place];  Service: Radiology;  Laterality: N/A;  DR. WOOD/MRI  . Skin melanocytoma excision  2012   "above left clavicle"  . TEE WITHOUT CARDIOVERSION  03/22/2012   Procedure: TRANSESOPHAGEAL ECHOCARDIOGRAM (TEE);  Surgeon: Candee Furbish, MD;  Location: [facility] ENDOSCOPY;  Service: Cardiovascular;  Laterality: N/A;  . TEE WITHOUT CARDIOVERSION N/A 02/08/2016   Procedure: TRANSESOPHAGEAL ECHOCARDIOGRAM (TEE);  Surgeon: Lelon Perla, MD;  Location: [facility] ENDOSCOPY;  Service: Cardiovascular;  Laterality: N/A;    There were no vitals filed for this visit.      Subjective Assessment - 06/12/16 1449    Subjective Took a pain pill earlier, pain was 4/10. Currently no pain, some tightness around the knee. I feel stronger in my leg.    Currently in Pain? No/denies   Pain Location --  Tightness in the knee Lt   Multiple Pain Sites No                         [facility] Adult PT Treatment/Exercise -  06/12/16 0001      Knee/Hip Exercises: Aerobic   Nustep L3 x 10 min     Knee/Hip Exercises: Machines for Strengthening   Total Gym Leg Press Seat #9 Bil 55# 3x 10, RTLE first plate and dial on 36# 3x 10     Knee/Hip Exercises: Standing   Forward Step Up --  stairs, 3 x 10 with bil rails   SLS Star exercises: tap forward/side/back then SLS 6x  Light LTLE support                  PT Short Term Goals - 06/12/16 1451      PT SHORT TERM GOAL #2   Title be able to go to the gym to do the bike or elliptical for improving strength   Time 4   Period Weeks   Status On-going      PT SHORT TERM GOAL #4   Title pain in right leg decreased >/= 25%    Time 4   Period Weeks   Status On-going  Just a small percentage           PT Long Term Goals - 06/09/16 1125      PT LONG TERM GOAL #1   Title independent with HEP and understand how to progress himself   Time 8   Period Weeks   Status On-going     PT LONG TERM GOAL #2   Title able to stand for 25 min with pain decreased >/= 50% so he is able to cook a meal.   Time 8   Period Weeks   Status On-going     PT LONG TERM GOAL #3   Title going up and down stairs with step to step with a cane   Time 8   Period Weeks     PT LONG TERM GOAL #4   Title sit on stool and scoot around office to assess patients   Time 8   Period Weeks   Status On-going     PT LONG TERM GOAL #5   Title return to household chores due to lower extremity strength >/= 4/5   Period Weeks   Status On-going     PT LONG TERM GOAL #6   Title return to driving due to increased right lower extremity strength >/= 4/5   Time 8   Period Weeks   Status On-going     PT LONG TERM GOAL #7   Title pain decreased >/= 50% in right knee   Time 8   Period Weeks   Status On-going               Plan - 06/12/16 1451    Clinical Impression Statement Pt had to begin on the leg press today as the Nustep & bike were occupied. This exercise really fatigued the RTLE. Forward step ups were more challenging than they had been, using slightly more UE support than previous.    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   PT Frequency 3x / week   PT Duration Other (comment)   PT Treatment/Interventions ADLs/Self Care Home Management;Cryotherapy;Electrical Stimulation;Functional mobility training;Stair training;Gait training;Ultrasound;Moist Heat;Therapeutic activities;Therapeutic exercise;Balance training;Neuromuscular re-education;Patient/family  education;Passive range of motion;Manual techniques;Energy conservation;Other (comment)   PT Next Visit Plan Closed chain RTLE exercises, leg press, Nustep   Consulted and Agree with Plan of Care Patient      Patient will benefit from skilled therapeutic intervention  in order to improve the following deficits and impairments:  Abnormal gait, Difficulty walking, Decreased endurance, Pain, Decreased activity tolerance, Decreased balance, Decreased strength, Decreased mobility  Visit Diagnosis: Muscle weakness (generalized)  Pain in right knee     Problem List Patient Active Problem List   Diagnosis Date Noted  . Right ankle pain   . Pain   . Tibial pain   . Acute idiopathic gout of right ankle   . Right knee pain   . Diabetes mellitus type 2 in obese ([place])   . Labile blood pressure   . Acute blood loss anemia   . Diabetes mellitus type 2 in nonobese ([facility])   . Debility 02/22/2016  . 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 Weakness of both lower extremities   . Lower extremity weakness   . Pneumonia   . Acute lumbar back pain   . Non-traumatic rhabdomyolysis   . Retroperitoneal bleed   . Back pain   . Leg weakness, bilateral   . Paroxysmal atrial fibrillation ([place])   . Ventricular fibrillation ([place]) 02/09/2016  . Aortic aneurysm without rupture ([place]) 02/09/2016  . Aortic insufficiency 02/09/2016  . HCAP (healthcare-associated pneumonia)   . Cardiac arrest ([place]) 02/01/2016  . Acute on chronic systolic heart failure ([place])   . Acute encephalopathy   . Thyroid activity decreased   . Acute hypoxemic respiratory failure ([place])   . Acute respiratory failure ([place])   . Encounter for central line placement   . Arrhythmia   . Altered mental status   . CKD (chronic kidney disease), stage IV ([place]) 08/06/2013    Class: Chronic  . Long term (current) use of anticoagulants 08/06/2013    Class: Chronic  . Special screening for malignant neoplasms, colon 04/11/2013   . Colon cancer screening 03/04/2013  . Chronic anticoagulation 03/04/2013  . Atrial flutter ([place]) 03/18/2012    Class: Acute  . Chronic systolic heart failure ([place]) 02/13/2012    Class: Acute  . Hypertension, accelerated 02/13/2012  . Aortic valve regurgitation, acquired 02/13/2012  . Aortic root enlargement ([place]) 02/13/2012  . Hyperlipidemia 02/13/2012  . Left bundle branch block 02/13/2012  . Obesity (BMI 30-39.9) 02/13/2012    Skylynn Burkley 06/12/2016, 3:15 PM  [facility] [address], [place], [place], [address] Phone: [phone]   Fax:  [phone]  Name: Boyce Keltner  MD MRN: [id number] Date of Birth: [birth year]

## 2016-06-14 ENCOUNTER — Encounter: Payer: Self-pay | Admitting: Physical Therapy

## 2016-06-14 ENCOUNTER — Ambulatory Visit: Payer: BC Managed Care – PPO | Admitting: Physical Therapy

## 2016-06-14 DIAGNOSIS — M6281 Muscle weakness (generalized): Secondary | ICD-10-CM | POA: Diagnosis not present

## 2016-06-14 DIAGNOSIS — M25561 Pain in right knee: Secondary | ICD-10-CM

## 2016-06-14 NOTE — Therapy (Signed)
[facility] [address], [place], [place], [address] Phone: [phone]   Fax:  [phone]  Physical Therapy Treatment  Patient Details  Name: Eric Dicaprio  MD MRN: [id number] Date of Birth: 01/24/1951 Referring Provider: Dr. Roe Coombs  Encounter Date: 06/14/2016      PT End of Session - 06/14/16 1406    Visit Number 15   Date for PT Re-Evaluation 08/26/16   Authorization Type BCBS   PT Start Time 1400   PT Stop Time 1445   PT Time Calculation (min) 45 min   Activity Tolerance Patient tolerated treatment well   Behavior During Therapy [facility] for tasks assessed/performed      Past Medical History:  Diagnosis Date  . CHF (congestive heart failure) ([place])   . Chronic kidney disease    kidney fx studies increased   . Chronic lower back pain   . Claustrophobia   . Dysrhythmia    "palpitations"  . Exertional dyspnea 01/2012  . Heart murmur   . Hypertension   . Hypothyroidism   . Migraine 02/13/12   "opthalmic"  . Varicose vein of leg    right    Past Surgical History:  Procedure Laterality Date  . CARDIAC CATHETERIZATION N/A 02/17/2016   Procedure: Left Heart Cath and Coronary Angiography;  Surgeon: Jettie Booze, MD;  Location: [place] [facility];  Service: Cardiovascular;  Laterality: N/A;  . CARDIOVERSION  03/22/2012   Procedure: CARDIOVERSION;  Surgeon: Candee Furbish, MD;  Location: [facility] ENDOSCOPY;  Service: Cardiovascular;  Laterality: N/A;  . CARDIOVERSION  04/19/2012   Procedure: CARDIOVERSION;  Surgeon: Sinclair Grooms, MD;  Location: [place];  Service: Cardiovascular;  Laterality: N/A;  . COLONOSCOPY N/A 04/11/2013   Procedure: COLONOSCOPY;  Surgeon: Inda Castle, MD;  [facility] ENDOSCOPY;  Service: Endoscopy;  Laterality: N/A;  . COLONOSCOPY N/A 04/11/2013   Procedure: COLONOSCOPY;  Surgeon: Inda Castle, MD;  Location: [facility] ENDOSCOPY;  Service: Endoscopy;  Laterality: N/A;  . EP  IMPLANTABLE DEVICE N/A 02/17/2016   Procedure: BiV ICD Insertion CRT-D;  Surgeon: Evans Lance, MD;  Location: [place] [facility];  Service: Cardiovascular;  Laterality: N/A;  . FINGER SURGERY  2012   "4th digit right hand; thumb on left hand"  . RADIOLOGY WITH ANESTHESIA N/A 02/11/2016   Procedure: MRI OF THE BRAIN WITHOUT CONTRAST, LUMBAR WITHOUT CONTRAST;  Surgeon: Medication Radiologist, MD;  Location: [place];  Service: Radiology;  Laterality: N/A;  DR. WOOD/MRI  . Skin melanocytoma excision  2012   "above left clavicle"  . TEE WITHOUT CARDIOVERSION  03/22/2012   Procedure: TRANSESOPHAGEAL ECHOCARDIOGRAM (TEE);  Surgeon: Candee Furbish, MD;  Location: [facility] ENDOSCOPY;  Service: Cardiovascular;  Laterality: N/A;  . TEE WITHOUT CARDIOVERSION N/A 02/08/2016   Procedure: TRANSESOPHAGEAL ECHOCARDIOGRAM (TEE);  Surgeon: Lelon Perla, MD;  Location: [facility] ENDOSCOPY;  Service: Cardiovascular;  Laterality: N/A;    There were no vitals filed for this visit.      Subjective Assessment - 06/14/16 1407    Subjective Went to the gym yesterday and did the recumbent bike. This bike was much harder than the Nustep.    Currently in Pain? Yes   Pain Score 6    Pain Location Knee  thigh   Pain Orientation Right   Aggravating Factors  Overdoing it   Pain Relieving Factors pain meds.   Multiple Pain Sites No  [facility] Adult PT Treatment/Exercise - 06/14/16 0001      Therapeutic Activites    Therapeutic Activities --  Simulation of going from gas to brake in the car: 15 sec x3     Knee/Hip Exercises: Aerobic   Stationary Bike L1 x 6 min   Nustep L3 x 10 min     Knee/Hip Exercises: Machines for Strengthening   Total Gym Leg Press Seat 9: 60# bil 10x, then seat 8 bil 60# 2x 10: RTLE 30# 2x 10       Knee/Hip Exercises: Standing   Heel Raises Both;2 sets;10 reps   Hip Abduction AROM;Stengthening;Both;2 sets;10 reps                PT Education - 06/14/16  1421    Education Details Standing hip abd, heel raises, for HEP progression   Person(s) Educated Patient;Caregiver(s)   Methods Explanation;Demonstration;Tactile cues;Verbal cues;Handout   Comprehension Verbalized understanding;Returned demonstration          PT Short Term Goals - 06/12/16 1451      PT SHORT TERM GOAL #2   Title be able to go to the gym to do the bike or elliptical for improving strength   Time 4   Period Weeks   Status On-going     PT SHORT TERM GOAL #4   Title pain in right leg decreased >/= 25%    Time 4   Period Weeks   Status On-going  Just a small percentage           PT Long Term Goals - 06/09/16 1125      PT LONG TERM GOAL #1   Title independent with HEP and understand how to progress himself   Time 8   Period Weeks   Status On-going     PT LONG TERM GOAL #2   Title able to stand for 25 min with pain decreased >/= 50% so he is able to cook a meal.   Time 8   Period Weeks   Status On-going     PT LONG TERM GOAL #3   Title going up and down stairs with step to step with a cane   Time 8   Period Weeks     PT LONG TERM GOAL #4   Title sit on stool and scoot around office to assess patients   Time 8   Period Weeks   Status On-going     PT LONG TERM GOAL #5   Title return to household chores due to lower extremity strength >/= 4/5   Period Weeks   Status On-going     PT LONG TERM GOAL #6   Title return to driving due to increased right lower extremity strength >/= 4/5   Time 8   Period Weeks   Status On-going     PT LONG TERM GOAL #7   Title pain decreased >/= 50% in right knee   Time 8   Period Weeks   Status On-going               Plan - 06/14/16 1418    Clinical Impression Statement Added to HEP today. Pt started going to the gym starting yesterday. Added weight to leg press again. All done without increasing pain.    Rehab Potential Good   Clinical Impairments Affecting Rehab Potential bilateral  retroperitioneal hemorrhage with femoral nerve involvement   PT Frequency 3x / week   PT Duration Other (comment)   PT Treatment/Interventions ADLs/Self Care  Home Management;Cryotherapy;Electrical Stimulation;Functional mobility training;Stair training;Gait training;Ultrasound;Moist Heat;Therapeutic activities;Therapeutic exercise;Balance training;Neuromuscular re-education;Patient/family education;Passive range of motion;Manual techniques;Energy conservation;Other (comment)   PT Next Visit Plan Closed chain RTLE exercises, leg press, Nustep, Bike, MMT in next 1-2 visits.       Patient will benefit from skilled therapeutic intervention in order to improve the following deficits and impairments:  Abnormal gait, Difficulty walking, Decreased endurance, Pain, Decreased activity tolerance, Decreased balance, Decreased strength, Decreased mobility  Visit Diagnosis: Muscle weakness (generalized)  Pain in right knee     Problem List Patient Active Problem List   Diagnosis Date Noted  . Right ankle pain   . Pain   . Tibial pain   . Acute idiopathic gout of right ankle   . Right knee pain   . Diabetes mellitus type 2 in obese ([place])   . Labile blood pressure   . Acute blood loss anemia   . Diabetes mellitus type 2 in nonobese ([facility])   . Debility 02/22/2016  . 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 Weakness of both lower extremities   . Lower extremity weakness   . Pneumonia   . Acute lumbar back pain   . Non-traumatic rhabdomyolysis   . Retroperitoneal bleed   . Back pain   . Leg weakness, bilateral   . Paroxysmal atrial fibrillation ([place])   . Ventricular fibrillation ([place]) 02/09/2016  . Aortic aneurysm without rupture ([place]) 02/09/2016  . Aortic insufficiency 02/09/2016  . HCAP (healthcare-associated pneumonia)   . Cardiac arrest ([place]) 02/01/2016  . Acute on chronic systolic heart failure ([place])   . Acute encephalopathy   . Thyroid activity decreased   . Acute  hypoxemic respiratory failure ([place])   . Acute respiratory failure ([place])   . Encounter for central line placement   . Arrhythmia   . Altered mental status   . CKD (chronic kidney disease), stage IV ([place]) 08/06/2013    Class: Chronic  . Long term (current) use of anticoagulants 08/06/2013    Class: Chronic  . Special screening for malignant neoplasms, colon 04/11/2013  . Colon cancer screening 03/04/2013  . Chronic anticoagulation 03/04/2013  . Atrial flutter ([place]) 03/18/2012    Class: Acute  . Chronic systolic heart failure ([place]) 02/13/2012    Class: Acute  . Hypertension, accelerated 02/13/2012  . Aortic valve regurgitation, acquired 02/13/2012  . Aortic root enlargement ([place]) 02/13/2012  . Hyperlipidemia 02/13/2012  . Left bundle branch block 02/13/2012  . Obesity (BMI 30-39.9) 02/13/2012    Zyasia Halbleib 06/14/2016, 2:41 PM  [facility] [address] [place], [place], [address] Phone: [phone]   Fax:  [phone]  Name: Eric Meas  MD MRN: [id number] Date of Birth: 1950-10-08

## 2016-06-14 NOTE — Patient Instructions (Signed)
ABDUCTION: Standing (Active)    Stand, feet flat. Lift right leg out to side. Use _0__ lbs. Complete _3__ sets of _10__ repetitions. Perform _2__ sessions per day.  http://gtsc.exer.us/110   Copyright  VHI. All rights reserved.  Heel Raise: Bilateral (Standing)    Rise on balls of feet. Repeat ___15_ times per set. Do _2___ sets per session. Do __2__ sessions per day.  http://orth.exer.us/38   Copyright  VHI. All rights reserved.

## 2016-06-16 ENCOUNTER — Ambulatory Visit: Payer: BC Managed Care – PPO | Admitting: Physical Therapy

## 2016-06-19 ENCOUNTER — Ambulatory Visit: Payer: BC Managed Care – PPO | Admitting: Physical Therapy

## 2016-06-19 ENCOUNTER — Encounter: Payer: Self-pay | Admitting: Physical Therapy

## 2016-06-19 DIAGNOSIS — M25561 Pain in right knee: Secondary | ICD-10-CM

## 2016-06-19 DIAGNOSIS — M6281 Muscle weakness (generalized): Secondary | ICD-10-CM | POA: Diagnosis not present

## 2016-06-19 DIAGNOSIS — R2689 Other abnormalities of gait and mobility: Secondary | ICD-10-CM

## 2016-06-19 NOTE — Therapy (Signed)
[facility] [address], [place], [place], [address] Phone: [phone]   Fax:  [phone]  Physical Therapy Treatment  Patient Details  Name: Eric Cueva  MD MRN: [id number] Date of Birth: October 28, 1950 Referring Provider: Dr. Roe Coombs  Encounter Date: 06/19/2016      PT End of Session - 06/19/16 1402    Visit Number 16   Date for PT Re-Evaluation 08/26/16   Authorization Type BCBS   PT Start Time 1359   PT Stop Time 1445   PT Time Calculation (min) 46 min   Activity Tolerance Patient tolerated treatment well   Behavior During Therapy [facility] for tasks assessed/performed      Past Medical History:  Diagnosis Date  . CHF (congestive heart failure) ([place])   . Chronic kidney disease    kidney fx studies increased   . Chronic lower back pain   . Claustrophobia   . Dysrhythmia    "palpitations"  . Exertional dyspnea 01/2012  . Heart murmur   . Hypertension   . Hypothyroidism   . Migraine 02/13/12   "opthalmic"  . Varicose vein of leg    right    Past Surgical History:  Procedure Laterality Date  . CARDIAC CATHETERIZATION N/A 02/17/2016   Procedure: Left Heart Cath and Coronary Angiography;  Surgeon: Jettie Booze, MD;  Location: [facility];  Service: Cardiovascular;  Laterality: N/A;  . CARDIOVERSION  03/22/2012   Procedure: CARDIOVERSION;  Surgeon: Candee Furbish, MD;  Location: [facility] ENDOSCOPY;  Service: Cardiovascular;  Laterality: N/A;  . CARDIOVERSION  04/19/2012   Procedure: CARDIOVERSION;  Surgeon: Sinclair Grooms, MD;  Location: [facility];  Service: Cardiovascular;  Laterality: N/A;  . COLONOSCOPY N/A 04/11/2013   Procedure: COLONOSCOPY;  Surgeon: Inda Castle, MD;  Location: [facility] ENDOSCOPY;  Service: Endoscopy;  Laterality: N/A;  . COLONOSCOPY N/A 04/11/2013   Procedure: COLONOSCOPY;  Surgeon: Inda Castle, MD;  Location: [facility] ENDOSCOPY;  Service: Endoscopy;  Laterality: N/A;  . EP  IMPLANTABLE DEVICE N/A 02/17/2016   Procedure: BiV ICD Insertion CRT-D;  Surgeon: Evans Lance, MD;  Location: [place] [facility];  Service: Cardiovascular;  Laterality: N/A;  . FINGER SURGERY  2012   "4th digit right hand; thumb on left hand"  . RADIOLOGY WITH ANESTHESIA N/A 02/11/2016   Procedure: MRI OF THE BRAIN WITHOUT CONTRAST, LUMBAR WITHOUT CONTRAST;  Surgeon: Medication Radiologist, MD;  Location: [place];  Service: Radiology;  Laterality: N/A;  DR. WOOD/MRI  . Skin melanocytoma excision  2012   "above left clavicle"  . TEE WITHOUT CARDIOVERSION  03/22/2012   Procedure: TRANSESOPHAGEAL ECHOCARDIOGRAM (TEE);  Surgeon: Candee Furbish, MD;  Location: [facility] ENDOSCOPY;  Service: Cardiovascular;  Laterality: N/A;  . TEE WITHOUT CARDIOVERSION N/A 02/08/2016   Procedure: TRANSESOPHAGEAL ECHOCARDIOGRAM (TEE);  Surgeon: Lelon Perla, MD;  Location: [facility] ENDOSCOPY;  Service: Cardiovascular;  Laterality: N/A;    There were no vitals filed for this visit.      Subjective Assessment - 06/19/16 1405    Subjective No new complaints, quad was burning the next day after last session. Thinks it was muscle.    Currently in Pain? No/denies  Took a pain pill earlier before coming so no current pain.    Multiple Pain Sites No            [facility] PT Assessment - 06/19/16 0001      AROM   Right Knee Extension --  Pt appears  to be able to kick the leg higher but canot hold                      [facility] Adult PT Treatment/Exercise - 06/19/16 0001      Knee/Hip Exercises: Aerobic   Stationary Bike L1 x 7 min   Nustep L3 x 10 min     Knee/Hip Exercises: Machines for Strengthening   Total Gym Leg Press Seat 9: Bil 65# 3x10, RTLE 30# 3x10     Knee/Hip Exercises: Standing   Lateral Step Up Right;1 set;10 reps;Hand Hold: 2;Step Height: 6"  More UE vs LE   Forward Step Up Right;2 sets;10 reps;Hand Hold: 1;Step Height: 6"   SLS With Body blade  ossilations 10x   A few finger tips on railing                   PT Short Term Goals - 06/19/16 1404      PT SHORT TERM GOAL #2   Title be able to go to the gym to do the bike or elliptical for improving strength   Time 4   Period Weeks   Status Achieved  Doing bike at gym.           PT Long Term Goals - 06/09/16 1125      PT LONG TERM GOAL #1   Title independent with HEP and understand how to progress himself   Time 8   Period Weeks   Status On-going     PT LONG TERM GOAL #2   Title able to stand for 25 min with pain decreased >/= 50% so he is able to cook a meal.   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 Pt was able to demonstrate less propulsion stepping up with one UE on rail. Second set showed fatigue and some knee pain. Pt is not able to show control with knee extension for a proper measurement. Visually he appears to be kicking it out higher.    Clinical Impairments Affecting Rehab Potential bilateral retroperitioneal hemorrhage with femoral nerve involvement   PT Frequency 3x / week   PT Duration Other (comment)   PT Treatment/Interventions ADLs/Self Care Home Management;Cryotherapy;Electrical  Stimulation;Functional mobility training;Stair training;Gait training;Ultrasound;Moist Heat;Therapeutic activities;Therapeutic exercise;Balance training;Neuromuscular re-education;Patient/family education;Passive  range of motion;Manual techniques;Energy conservation;Other (comment)   PT Next Visit Plan Closed chain RTLE exercises, leg press, Nustep, Bike, MMT in next visit.    Consulted and Agree with Plan of Care Patient      Patient will benefit from skilled therapeutic intervention in order to improve the following deficits and impairments:  Abnormal gait, Difficulty walking, Decreased endurance, Pain, Decreased activity tolerance, Decreased balance, Decreased strength, Decreased mobility  Visit Diagnosis: Pain in right knee  Other abnormalities of gait and mobility     Problem List Patient Active Problem List   Diagnosis Date Noted  . Right ankle pain   . Pain   . Tibial pain   . Acute idiopathic gout of right ankle   . Right knee pain   . Diabetes mellitus type 2 in obese ([place])   . Labile blood pressure   . Acute blood loss anemia   . Diabetes mellitus type 2 in nonobese ([facility])   . Debility 02/22/2016  . 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 Weakness of both lower extremities   . Lower extremity weakness   . Pneumonia   . Acute lumbar back pain   . Non-traumatic rhabdomyolysis   . Retroperitoneal bleed   . Back pain   . Leg weakness, bilateral   . Paroxysmal atrial fibrillation ([place])   . Ventricular fibrillation ([place]) 02/09/2016  . Aortic aneurysm without rupture ([place]) 02/09/2016  . Aortic insufficiency 02/09/2016  . HCAP (healthcare-associated pneumonia)   . Cardiac arrest ([place]) 02/01/2016  . Acute on chronic systolic heart failure ([place])   . Acute encephalopathy   . Thyroid activity decreased   . Acute hypoxemic respiratory failure ([place])   . Acute respiratory failure ([place])   . Encounter for central line placement   . Arrhythmia   . Altered  mental status   . CKD (chronic kidney disease), stage IV ([place]) 08/06/2013    Class: Chronic  . Long term (current) use of anticoagulants 08/06/2013    Class: Chronic  . Special screening for malignant neoplasms, colon 04/11/2013  . Colon cancer screening 03/04/2013  . Chronic anticoagulation 03/04/2013  . Atrial flutter ([place]) 03/18/2012    Class: Acute  . Chronic systolic heart failure ([place]) 02/13/2012    Class: Acute  . Hypertension, accelerated 02/13/2012  . Aortic valve regurgitation, acquired 02/13/2012  . Aortic root enlargement ([place]) 02/13/2012  . Hyperlipidemia 02/13/2012  . Left bundle branch block 02/13/2012  . Obesity (BMI 30-39.9) 02/13/2012    Shamaine Mulkern, PTA 06/19/2016, 2:42 PM  [facility] [address] [place], [place], [address] Phone: [phone]   Fax:  [phone]  Name: Eric Greathouse  MD MRN: [id number] Date of Birth: 12-07-1950

## 2016-06-21 ENCOUNTER — Ambulatory Visit: Payer: BC Managed Care – PPO | Admitting: Physical Therapy

## 2016-06-21 ENCOUNTER — Encounter: Payer: Self-pay | Admitting: Physical Therapy

## 2016-06-21 DIAGNOSIS — M6281 Muscle weakness (generalized): Secondary | ICD-10-CM

## 2016-06-21 DIAGNOSIS — M25561 Pain in right knee: Secondary | ICD-10-CM

## 2016-06-21 DIAGNOSIS — R2689 Other abnormalities of gait and mobility: Secondary | ICD-10-CM

## 2016-06-21 NOTE — Therapy (Signed)
[facility] [address] [place], [place], [address] Phone: [phone]   Fax:  [phone]  Physical Therapy Treatment  Patient Details  Name: Eric Reihl  MD MRN: [id number] Date of Birth: March 21, 1951 Referring Provider: Dr. Roe Coombs  Encounter Date: 06/21/2016      PT End of Session - 06/21/16 1406    Visit Number 17   Date for PT Re-Evaluation 08/26/16   Authorization Type BCBS   PT Start Time 1400   PT Stop Time 1450   PT Time Calculation (min) 50 min   Activity Tolerance Patient tolerated treatment well   Behavior During Therapy [facility] for tasks assessed/performed      Past Medical History:  Diagnosis Date  . CHF (congestive heart failure) ([place])   . Chronic kidney disease    kidney fx studies increased   . Chronic lower back pain   . Claustrophobia   . Dysrhythmia    "palpitations"  . Exertional dyspnea 01/2012  . Heart murmur   . Hypertension   . Hypothyroidism   . Migraine 02/13/12   "opthalmic"  . Varicose vein of leg    right    Past Surgical History:  Procedure Laterality Date  . CARDIAC CATHETERIZATION N/A 02/17/2016   Procedure: Left Heart Cath and Coronary Angiography;  Surgeon: Jettie Booze, MD;  Location: [place] [facility];  Service: Cardiovascular;  Laterality: N/A;  . CARDIOVERSION  03/22/2012   Procedure: CARDIOVERSION;  Surgeon: Candee Furbish, MD;  Location: [facility] ENDOSCOPY;  Service: Cardiovascular;  Laterality: N/A;  . CARDIOVERSION  04/19/2012   Procedure: CARDIOVERSION;  Surgeon: Sinclair Grooms, MD;  Location: [place];  Service: Cardiovascular;  Laterality: N/A;  . COLONOSCOPY N/A 04/11/2013   Procedure: COLONOSCOPY;  Surgeon: Inda Castle, MD;  [facility]: [facility] ENDOSCOPY;  Service: Endoscopy;  Laterality: N/A;  . COLONOSCOPY N/A 04/11/2013   Procedure: COLONOSCOPY;  Surgeon: Inda Castle, MD;  Location: [facility] ENDOSCOPY;  Service: Endoscopy;  Laterality: N/A;  . EP  IMPLANTABLE DEVICE N/A 02/17/2016   Procedure: BiV ICD Insertion CRT-D;  Surgeon: Evans Lance, MD;  Location: [place] [facility];  Service: Cardiovascular;  Laterality: N/A;  . FINGER SURGERY  2012   "4th digit right hand; thumb on left hand"  . RADIOLOGY WITH ANESTHESIA N/A 02/11/2016   Procedure: MRI OF THE BRAIN WITHOUT CONTRAST, LUMBAR WITHOUT CONTRAST;  Surgeon: Medication Radiologist, MD;  Location: [place];  Service: Radiology;  Laterality: N/A;  DR. WOOD/MRI  . Skin melanocytoma excision  2012   "above left clavicle"  . TEE WITHOUT CARDIOVERSION  03/22/2012   Procedure: TRANSESOPHAGEAL ECHOCARDIOGRAM (TEE);  Surgeon: Candee Furbish, MD;  Location: [facility] ENDOSCOPY;  Service: Cardiovascular;  Laterality: N/A;  . TEE WITHOUT CARDIOVERSION N/A 02/08/2016   Procedure: TRANSESOPHAGEAL ECHOCARDIOGRAM (TEE);  Surgeon: Lelon Perla, MD;  Location: [facility] ENDOSCOPY;  Service: Cardiovascular;  Laterality: N/A;    There were no vitals filed for this visit.      Subjective Assessment - 06/21/16 1405    Subjective Knee is stiff but ok. Thinkning about going back to work for a few hours next week.    Currently in Pain? No/denies   Multiple Pain Sites No                         [facility] Adult PT Treatment/Exercise - 06/21/16 0001      Knee/Hip Exercises: Aerobic   Stationary Bike L1  x 8 min   Nustep L3 x 10 min     Knee/Hip Exercises: Machines for Strengthening   Total Gym Leg Press seat #9 65# 3x10, RTLE 30# 3x10     Knee/Hip Exercises: Standing   Heel Raises Both;2 sets;10 reps   Hip Abduction AROM;Stengthening;Both;2 sets;10 reps   Lateral Step Up Right;2 sets;10 reps;Hand Hold: 2;Step Height: 6"   Forward Step Up Right;2 sets;10 reps;Hand Hold: 1;Step Height: 6"   Step Down Right;2 sets;10 reps;Hand Hold: 1;Step Height: 2";Step Height: 4"   Step Down Limitations Weakness at quad with 4inch                  PT Short Term Goals - 06/19/16 1404      PT SHORT TERM  GOAL #2   Title be able to go to the gym to do the bike or elliptical for improving strength   Time 4   Period Weeks   Status Achieved  Doing bike at gym.           PT Long Term Goals - 06/09/16 1125      PT LONG TERM GOAL #1   Title independent with HEP and understand how to progress himself   Time 8   Period Weeks   Status On-going     PT LONG TERM GOAL #2   Title able to stand for 25 min with pain decreased >/= 50% so he is able to cook a meal.   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 4 inch box was challenging with decreased control at quad due to weakness. Increased weight on leg press and time on bike. Pt is considering seeing a few pts in his office next week.    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   PT Frequency 3x / week   PT Duration Other (comment)   PT Treatment/Interventions ADLs/Self Care Home Management;Cryotherapy;Electrical Stimulation;Functional mobility training;Stair training;Gait training;Ultrasound;Moist Heat;Therapeutic activities;Therapeutic exercise;Balance training;Neuromuscular  re-education;Patient/family education;Passive range of motion;Manual techniques;Energy conservation;Other (comment)   PT Next Visit Plan Closed chain RTLE exercises, leg press, Nustep, Bike, step downs.   Consulted and Agree with Plan of Care Patient      Patient will benefit from skilled therapeutic intervention in order to improve the following deficits  and impairments:  Abnormal gait, Difficulty walking, Decreased endurance, Pain, Decreased activity tolerance, Decreased balance, Decreased strength, Decreased mobility  Visit Diagnosis: Pain in right knee  Other abnormalities of gait and mobility  Muscle weakness (generalized)     Problem List Patient Active Problem List   Diagnosis Date Noted  . Right ankle pain   . Pain   . Tibial pain   . Acute idiopathic gout of right ankle   . Right knee pain   . Diabetes mellitus type 2 in obese ([place])   . Labile blood pressure   . Acute blood loss anemia   . Diabetes mellitus type 2 in nonobese ([facility])   . Debility 02/22/2016  . 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 Weakness of both lower extremities   . Lower extremity weakness   . Pneumonia   . Acute lumbar back pain   . Non-traumatic rhabdomyolysis   . Retroperitoneal bleed   . Back pain   . Leg weakness, bilateral   . Paroxysmal atrial fibrillation ([place])   . Ventricular fibrillation ([place]) 02/09/2016  . Aortic aneurysm without rupture ([place]) 02/09/2016  . Aortic insufficiency 02/09/2016  . HCAP (healthcare-associated pneumonia)   . Cardiac arrest ([place]) 02/01/2016  . Acute on chronic systolic heart failure ([place])   . Acute encephalopathy   . Thyroid activity decreased   . Acute hypoxemic respiratory failure ([place])   . Acute respiratory failure ([place])   . Encounter for central line placement   . Arrhythmia   . Altered mental status   . CKD (chronic kidney disease), stage IV ([place]) 08/06/2013    Class: Chronic  . Long term (current) use of anticoagulants  08/06/2013    Class: Chronic  . Special screening for malignant neoplasms, colon 04/11/2013  . Colon cancer screening 03/04/2013  . Chronic anticoagulation 03/04/2013  . Atrial flutter ([place]) 03/18/2012    Class: Acute  . Chronic systolic heart failure ([place]) 02/13/2012    Class: Acute  . Hypertension, accelerated 02/13/2012  . Aortic valve regurgitation, acquired 02/13/2012  . Aortic root enlargement ([place]) 02/13/2012  . Hyperlipidemia 02/13/2012  . Left bundle branch block 02/13/2012  . Obesity (BMI 30-39.9) 02/13/2012    Zianne Schubring, PTA 06/21/2016, 2:39 PM  [facility] [address] [place], [place], [address] Phone: [phone]   Fax:  [phone]  Name: Richerd Grime  MD MRN: [id number] Date of Birth: 1951-06-21

## 2016-06-23 ENCOUNTER — Ambulatory Visit: Payer: BC Managed Care – PPO | Admitting: Physical Therapy

## 2016-06-23 DIAGNOSIS — M25561 Pain in right knee: Secondary | ICD-10-CM

## 2016-06-23 DIAGNOSIS — M6281 Muscle weakness (generalized): Secondary | ICD-10-CM

## 2016-06-23 DIAGNOSIS — R2689 Other abnormalities of gait and mobility: Secondary | ICD-10-CM

## 2016-06-23 NOTE — Therapy (Signed)
[facility] [address] [place], [place], [address] Phone: [phone]   Fax:  [phone]  Physical Therapy Treatment  Patient Details  Name: Eric Hackbart  MD MRN: [id number] Date of Birth: 17-Jun-1951 Referring Provider: Dr. Roe Coombs  Encounter Date: 06/23/2016      PT End of Session - 06/23/16 1134    Visit Number 18   Date for PT Re-Evaluation 08/26/16   Authorization Type BCBS   PT Start Time 1102   PT Stop Time 1150   PT Time Calculation (min) 48 min   Activity Tolerance Patient tolerated treatment well      Past Medical History:  Diagnosis Date  . CHF (congestive heart failure) ([place])   . Chronic kidney disease    kidney fx studies increased   . Chronic lower back pain   . Claustrophobia   . Dysrhythmia    "palpitations"  . Exertional dyspnea 01/2012  . Heart murmur   . Hypertension   . Hypothyroidism   . Migraine 02/13/12   "opthalmic"  . Varicose vein of leg    right    Past Surgical History:  Procedure Laterality Date  . CARDIAC CATHETERIZATION N/A 02/17/2016   Procedure: Left Heart Cath and Coronary Angiography;  Surgeon: Jettie Booze, MD;  Location: [facility];  Service: Cardiovascular;  Laterality: N/A;  . CARDIOVERSION  03/22/2012   Procedure: CARDIOVERSION;  Surgeon: Candee Furbish, MD;  Location: [facility] ENDOSCOPY;  Service: Cardiovascular;  Laterality: N/A;  . CARDIOVERSION  04/19/2012   Procedure: CARDIOVERSION;  Surgeon: Sinclair Grooms, MD;  Location: [place];  Service: Cardiovascular;  Laterality: N/A;  . COLONOSCOPY N/A 04/11/2013   Procedure: COLONOSCOPY;  Surgeon: Inda Castle, MD;  Location: [facility] ENDOSCOPY;  Service: Endoscopy;  Laterality: N/A;  . COLONOSCOPY N/A 04/11/2013   Procedure: COLONOSCOPY;  Surgeon: Inda Castle, MD;  Location: [facility] ENDOSCOPY;  Service: Endoscopy;  Laterality: N/A;  . EP IMPLANTABLE DEVICE N/A 02/17/2016   Procedure: BiV ICD Insertion  CRT-D;  Surgeon: Evans Lance, MD;  Location: [place] [facility];  Service: Cardiovascular;  Laterality: N/A;  . FINGER SURGERY  2012   "4th digit right hand; thumb on left hand"  . RADIOLOGY WITH ANESTHESIA N/A 02/11/2016   Procedure: MRI OF THE BRAIN WITHOUT CONTRAST, LUMBAR WITHOUT CONTRAST;  Surgeon: Medication Radiologist, MD;  Location: [place];  Service: Radiology;  Laterality: N/A;  DR. WOOD/MRI  . Skin melanocytoma excision  2012   "above left clavicle"  . TEE WITHOUT CARDIOVERSION  03/22/2012   Procedure: TRANSESOPHAGEAL ECHOCARDIOGRAM (TEE);  Surgeon: Candee Furbish, MD;  Location: [facility] ENDOSCOPY;  Service: Cardiovascular;  Laterality: N/A;  . TEE WITHOUT CARDIOVERSION N/A 02/08/2016   Procedure: TRANSESOPHAGEAL ECHOCARDIOGRAM (TEE);  Surgeon: Lelon Perla, MD;  Location: [facility] ENDOSCOPY;  Service: Cardiovascular;  Laterality: N/A;    There were no vitals filed for this visit.      Subjective Assessment - 06/23/16 1106    Subjective I usually take a pain pill before or after PT but I did not take one today.  Reports next day muscular soreness.  Going to work a little bit next week.     Currently in Pain? Yes   Pain Score 3    Pain Location Knee   Pain Orientation Right   Pain Type Acute pain   Pain Onset More than a month ago   Pain Frequency Intermittent  [place] Adult PT Treatment/Exercise - 06/23/16 0001      Knee/Hip Exercises: Aerobic   Stationary Bike L1 x 8 min   Nustep L3 x 10 min     Knee/Hip Exercises: Machines for Strengthening   Total Gym Leg Press seat #9 65# 3x10, RTLE 30# 3x10     Knee/Hip Exercises: Standing   Heel Raises Both;2 sets;10 reps   Hip Abduction Stengthening;Left;1 set;10 reps;Knee straight   Abduction Limitations red band on left, WB on right   Hip Extension Stengthening;Left;1 set;10 reps;Knee straight   Extension Limitations red band on left ,WB on right   Forward Step Up Right;2 sets;10 reps;Hand  Hold: 1;Step Height: 6"   Step Down Left;1 set;10 reps;Hand Hold: 2;Step Height: 2"                  PT Short Term Goals - 06/23/16 1141      PT SHORT TERM GOAL #1   Title independent with initial HEP    Status Achieved     PT SHORT TERM GOAL #2   Title be able to go to the gym to do the bike or elliptical for improving strength   Status Achieved     PT SHORT TERM GOAL #3   Title right knee extension AROM in sitting -30 degress due to increase strength   Time 4   Period Weeks   Status On-going     PT SHORT TERM GOAL #4   Title pain in right leg decreased >/= 25%    Time 4   Period Weeks   Status On-going     PT SHORT TERM GOAL #5   Title able to get in and out of car without helping his right leg due to increased strength   Status Achieved           PT Long Term Goals - 06/23/16 1141      PT LONG TERM GOAL #1   Title independent with HEP and understand how to progress himself   Time 8   Period Weeks   Status On-going     PT LONG TERM GOAL #2   Title able to stand for 25 min with pain decreased >/= 50% so he is able to cook a meal.   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   Time 8   Period Weeks   Status On-going     PT LONG TERM GOAL #5   Title return to household chores due to lower extremity strength >/= 4/5   Time 8   Period Weeks   Status On-going     PT LONG TERM GOAL #6   Title return to driving due to increased right lower extremity strength >/= 4/5   Time 8   Period Weeks   Status On-going     PT LONG TERM GOAL #7   Title pain decreased >/= 50% in right knee   Time 8   Period Weeks   Status On-going               Plan - 06/23/16 1134    Clinical Impression Statement The patient is  progressing  well with quad muscle activation.  He does require verbal and tactile cues to avoid knee hyperextension as a means to compensate for weakness in standing.  He needs 25% UE use with 4 inch step height but 50-60% UE use with 6 inch step ups and 2 inch step downs.  Close supervision for safety.    PT Next Visit Plan Closed chain RTLE exercises, leg press, Nustep, Bike end of session to relieve stiffness;, step downs.      Patient will benefit from skilled therapeutic intervention in order to improve the following deficits and impairments:     Visit Diagnosis: Pain in right knee  Other abnormalities of gait and mobility  Muscle weakness (generalized)     Problem List Patient Active Problem List   Diagnosis Date Noted  . Right ankle pain   . Pain   . Tibial pain   . Acute idiopathic gout of right ankle   . Right knee pain   . Diabetes mellitus type 2 in obese ([place])   . Labile blood pressure   . Acute blood loss anemia   . Diabetes mellitus type 2 in nonobese ([facility])   . Debility 02/22/2016  . 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 Weakness of both lower extremities   . Lower extremity weakness   . Pneumonia   . Acute lumbar back pain   . Non-traumatic rhabdomyolysis   . Retroperitoneal bleed   . Back pain   . Leg weakness, bilateral   . Paroxysmal atrial fibrillation ([place])   . Ventricular fibrillation ([place]) 02/09/2016  . Aortic aneurysm without rupture ([place]) 02/09/2016  . Aortic insufficiency 02/09/2016  . HCAP (healthcare-associated pneumonia)   . Cardiac arrest ([place]) 02/01/2016  . Acute on chronic systolic heart failure ([place])   . Acute encephalopathy   . Thyroid activity decreased   . Acute hypoxemic respiratory failure ([place])   . Acute respiratory failure ([place])   . Encounter for central line placement   . Arrhythmia   . Altered mental status   . CKD (chronic kidney disease), stage IV ([place]) 08/06/2013    Class: Chronic  . Long term (current) use of  anticoagulants 08/06/2013    Class: Chronic  . Special screening for malignant neoplasms, colon 04/11/2013  . Colon cancer screening 03/04/2013  . Chronic anticoagulation 03/04/2013  . Atrial flutter ([place]) 03/18/2012    Class: Acute  . Chronic systolic heart failure ([place]) 02/13/2012    Class: Acute  . Hypertension, accelerated 02/13/2012  . Aortic valve regurgitation, acquired 02/13/2012  . Aortic root enlargement ([place]) 02/13/2012  . Hyperlipidemia 02/13/2012  . Left bundle branch block 02/13/2012  . Obesity (BMI 30-39.9) 02/13/2012   Ruben Im, PT 06/23/16 11:45 AM Phone: [phone] Fax: [phone]  Alvera Singh 06/23/2016, 11:44 AM  [facility] [address] [place], [place], [address] Phone: [phone]   Fax:  [phone]  Name: Eric Zerkle  MD MRN: [id number] Date of Birth: 08/05/1951

## 2016-06-28 ENCOUNTER — Encounter: Payer: Self-pay | Admitting: Physical Therapy

## 2016-06-28 ENCOUNTER — Ambulatory Visit: Payer: BC Managed Care – PPO | Attending: Internal Medicine | Admitting: Physical Therapy

## 2016-06-28 DIAGNOSIS — R2689 Other abnormalities of gait and mobility: Secondary | ICD-10-CM | POA: Diagnosis present

## 2016-06-28 DIAGNOSIS — M6281 Muscle weakness (generalized): Secondary | ICD-10-CM | POA: Insufficient documentation

## 2016-06-28 DIAGNOSIS — G8929 Other chronic pain: Secondary | ICD-10-CM | POA: Diagnosis present

## 2016-06-28 DIAGNOSIS — M545 Low back pain, unspecified: Secondary | ICD-10-CM

## 2016-06-28 DIAGNOSIS — M25561 Pain in right knee: Secondary | ICD-10-CM | POA: Diagnosis present

## 2016-06-28 NOTE — Therapy (Signed)
[facility] [address], [name], [place], [address] Phone: [phone]   Fax:  [phone]  Physical Therapy Treatment  Patient Details  Name: Eric Harville  MD MRN: [id number] Date of Birth: [birth year] Referring Provider: Dr. Roe Coombs  Encounter Date: 06/28/2016      PT End of Session - 06/28/16 1409    Visit Number 19   Date for PT Re-Evaluation 08/26/16   Authorization Type BCBS   PT Start Time 1407   PT Stop Time 1445   PT Time Calculation (min) 38 min   Activity Tolerance Patient tolerated treatment well   Behavior During Therapy [facility] for tasks assessed/performed      Past Medical History:  Diagnosis Date  . CHF (congestive heart failure) ([place])   . Chronic kidney disease    kidney fx studies increased   . Chronic lower back pain   . Claustrophobia   . Dysrhythmia    "palpitations"  . Exertional dyspnea 01/2012  . Heart murmur   . Hypertension   . Hypothyroidism   . Migraine 02/13/12   "opthalmic"  . Varicose vein of leg    right    Past Surgical History:  Procedure Laterality Date  . CARDIAC CATHETERIZATION N/A 02/17/2016   Procedure: Left Heart Cath and Coronary Angiography;  Surgeon: Jettie Booze, MD;  Location: [place] [facility];  Service: Cardiovascular;  Laterality: N/A;  . CARDIOVERSION  03/22/2012   Procedure: CARDIOVERSION;  Surgeon: Candee Furbish, MD;  Location: [facility] ENDOSCOPY;  Service: Cardiovascular;  Laterality: N/A;  . CARDIOVERSION  04/19/2012   Procedure: CARDIOVERSION;  Surgeon: Sinclair Grooms, MD;  Location: [place];  Service: Cardiovascular;  Laterality: N/A;  . COLONOSCOPY N/A 04/11/2013   Procedure: COLONOSCOPY;  Surgeon: Inda Castle, MD;  Location: [facility] ENDOSCOPY;  Service: Endoscopy;  Laterality: N/A;  . COLONOSCOPY N/A 04/11/2013   Procedure: COLONOSCOPY;  Surgeon: Inda Castle, MD;  Location: [facility] ENDOSCOPY;  Service: Endoscopy;  Laterality: N/A;  . EP  IMPLANTABLE DEVICE N/A 02/17/2016   Procedure: BiV ICD Insertion CRT-D;  Surgeon: Evans Lance, MD;  Location: [place] [facility];  Service: Cardiovascular;  Laterality: N/A;  . FINGER SURGERY  2012   "4th digit right hand; thumb on left hand"  . RADIOLOGY WITH ANESTHESIA N/A 02/11/2016   Procedure: MRI OF THE BRAIN WITHOUT CONTRAST, LUMBAR WITHOUT CONTRAST;  Surgeon: Medication Radiologist, MD;  Location: [place];  Service: Radiology;  Laterality: N/A;  DR. WOOD/MRI  . Skin melanocytoma excision  2012   "above left clavicle"  . TEE WITHOUT CARDIOVERSION  03/22/2012   Procedure: TRANSESOPHAGEAL ECHOCARDIOGRAM (TEE);  Surgeon: Candee Furbish, MD;  Location: [facility] ENDOSCOPY;  Service: Cardiovascular;  Laterality: N/A;  . TEE WITHOUT CARDIOVERSION N/A 02/08/2016   Procedure: TRANSESOPHAGEAL ECHOCARDIOGRAM (TEE);  Surgeon: Lelon Perla, MD;  [facility];  Service: Cardiovascular;  Laterality: N/A;    There were no vitals filed for this visit.      Subjective Assessment - 06/28/16 1405    Subjective Pt went back to work yesterday for a half a day. Reports went well. Had some increased tightness after prolonged sitting but was able to get up and walk around to loosen up.    Patient is accompained by: Family member   How long can you sit comfortably? sitting for 45 min and right leg gets numb   How long can you stand comfortably? without walker 60 sec.  How long can you walk comfortably? walks with rolling waker and pressure in right knee   Patient Stated Goals regain previous mobility.  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 cooking, making bed, washing dishes    Currently in Pain? Yes   Pain Score 4    Pain Location Knee   Pain Orientation Right   Pain Descriptors / Indicators Tightness   Pain Type Acute pain                         [facility] Adult PT Treatment/Exercise - 06/28/16 0001      Knee/Hip Exercises: Aerobic   Stationary Bike L1 x 8  min     Knee/Hip Exercises: Machines for Strengthening   Total Gym Leg Press seat #9 65# 3x10, RTLE 30# 3x10     Knee/Hip Exercises: Standing   Heel Raises Both;2 sets;10 reps   Knee Flexion Strengthening;Both;2 sets;10 reps   Knee Flexion Limitations hamstring curls   Hip ADduction Strengthening;Both;1 set;10 reps   Hip ADduction Limitations red band on Lt; WB on Rt   Hip Abduction Stengthening;Left;1 set;10 reps;Knee straight   Abduction Limitations red band on left, WB on right   Hip Extension Stengthening;Left;1 set;10 reps;Knee straight   Extension Limitations red band on left ,WB on right   Forward Step Up Right;2 sets;10 reps;Hand Hold: 1;Step Height: 6"   Step Down Left;1 set;10 reps;Hand Hold: 2;Step Height: 2"                  PT Short Term Goals - 06/28/16 1409      PT SHORT TERM GOAL #1   Title independent with initial HEP    Time 4   Period Weeks   Status Achieved     PT SHORT TERM GOAL #2   Title be able to go to the gym to do the bike or elliptical for improving strength   Time 4   Period Weeks   Status Achieved     PT SHORT TERM GOAL #3   Title right knee extension AROM in sitting -30 degress due to increase strength   Time 4   Period Weeks   Status On-going     PT SHORT TERM GOAL #4   Title pain in right leg decreased >/= 25%    Time 4   Period Weeks   Status On-going     PT SHORT TERM GOAL #5   Title able to get in and out of car without helping his right leg due to increased strength   Time 4   Period Weeks   Status Achieved           PT Long Term Goals - 06/28/16 1410      PT LONG TERM GOAL #1   Title independent with HEP and understand how to progress himself   Time 8   Period Weeks   Status On-going     PT LONG TERM GOAL #2   Title able to stand for 25 min with pain decreased >/= 50% so he is able to cook a meal.   Time 8   Period Weeks   Status On-going     PT LONG TERM GOAL #3   Title going up and down stairs  with step to step with a cane   Time 8   Period Weeks     PT LONG TERM GOAL #4   Title sit on stool and  scoot around office to assess patients   Time 8   Period Weeks   Status On-going     PT LONG TERM GOAL #5   Title return to household chores due to lower extremity strength >/= 4/5   Time 8   Period Weeks   Status On-going     PT LONG TERM GOAL #6   Title return to driving due to increased right lower extremity strength >/= 4/5   Time 8   Period Weeks   Status On-going     PT LONG TERM GOAL #7   Title pain decreased >/= 50% in right knee   Time 8   Period Weeks   Status On-going               Plan - 06/28/16 1504    Clinical Impression Statement Pt presents to PT clinic using only straight cane. Pt continues to have decreased knee stabilization. Continues to have decreased knee extension with stair ascending and decending. Able to tolerate all exercises well today with SBA from therapist for safety.  Pt will benefit from continued skilled therapy for knee strength and stabilization for safety with ambulation and community mobiility.    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   PT Frequency 3x / week   PT Duration Other (comment)   PT Treatment/Interventions ADLs/Self Care Home Management;Cryotherapy;Electrical Stimulation;Functional mobility training;Stair training;Gait training;Ultrasound;Moist Heat;Therapeutic activities;Therapeutic exercise;Balance training;Neuromuscular re-education;Patient/family education;Passive range of motion;Manual techniques;Energy conservation;Other (comment)   PT Next Visit Plan Closed chain RTLE exercises, leg press, Nustep, Bike end of session to relieve stiffness;, step downs.   Consulted and Agree with Plan of Care Patient      Patient will benefit from skilled therapeutic intervention in order to improve the following deficits and  impairments:  Abnormal gait, Difficulty walking, Decreased endurance, Pain, Decreased activity tolerance, Decreased balance, Decreased strength, Decreased mobility  Visit Diagnosis: Right knee pain, unspecified chronicity  Other abnormalities of gait and mobility  Muscle weakness (generalized)  Left-sided low back pain without sciatica, unspecified chronicity     Problem List Patient Active Problem List   Diagnosis Date Noted  . Right ankle pain   . Pain   . Tibial pain   . Acute idiopathic gout of right ankle   . Right knee pain   . Diabetes mellitus type 2 in obese ([place])   . Labile blood pressure   . Acute blood loss anemia   . Diabetes mellitus type 2 in nonobese ([facility])   . Debility 02/22/2016  . 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 Weakness of both lower extremities   . Lower extremity weakness   . Pneumonia   . Acute lumbar back pain   . Non-traumatic rhabdomyolysis   . Retroperitoneal bleed   . Back pain   . Leg weakness, bilateral   . Paroxysmal atrial fibrillation ([place])   . Ventricular fibrillation ([place]) 02/09/2016  . Aortic aneurysm without rupture ([place]) 02/09/2016  . Aortic insufficiency 02/09/2016  . HCAP (healthcare-associated pneumonia)   . Cardiac arrest ([place]) 02/01/2016  . Acute on chronic systolic heart failure ([place])   . Acute encephalopathy   . Thyroid activity decreased   . Acute hypoxemic respiratory failure ([place])   . Acute respiratory failure ([place])   . Encounter for central line placement   . Arrhythmia   . Altered mental status   . CKD (chronic kidney disease), stage IV (  [place]) 08/06/2013    Class: Chronic  . Long term (current) use of anticoagulants 08/06/2013    Class: Chronic  . Special screening for malignant neoplasms, colon 04/11/2013  . Colon cancer screening 03/04/2013  . Chronic anticoagulation 03/04/2013  . Atrial flutter ([place]) 03/18/2012    Class: Acute  . Chronic systolic heart failure ([place]) 02/13/2012     Class: Acute  . Hypertension, accelerated 02/13/2012  . Aortic valve regurgitation, acquired 02/13/2012  . Aortic root enlargement ([place]) 02/13/2012  . Hyperlipidemia 02/13/2012  . Left bundle branch block 02/13/2012  . Obesity (BMI 30-39.9) 02/13/2012    Mikle Bosworth PTA 06/28/2016, 3:20 PM  [facility] [address] [place], [place], [address] Phone: [phone]   Fax:  [phone]  Name: Eric Yam  MD MRN: [id number] Date of Birth: [birth year]

## 2016-06-29 ENCOUNTER — Encounter: Payer: BC Managed Care – PPO | Admitting: Physical Therapy

## 2016-06-30 ENCOUNTER — Ambulatory Visit: Payer: BC Managed Care – PPO | Admitting: Physical Therapy

## 2016-06-30 DIAGNOSIS — R2689 Other abnormalities of gait and mobility: Secondary | ICD-10-CM

## 2016-06-30 DIAGNOSIS — M25561 Pain in right knee: Secondary | ICD-10-CM | POA: Diagnosis not present

## 2016-06-30 DIAGNOSIS — M6281 Muscle weakness (generalized): Secondary | ICD-10-CM

## 2016-06-30 NOTE — Therapy (Signed)
[facility] [address] [place], [place], [address] Phone: [phone]   Fax:  [phone]  Physical Therapy Treatment  Patient Details  Name: Eric Glennon  MD MRN: [id number] Date of Birth: 05-May-1951 Referring Provider: Dr. Roe Coombs  Encounter Date: 06/30/2016      PT End of Session - 06/30/16 1138    Visit Number 20   Date for PT Re-Evaluation 08/26/16   Authorization Type BCBS   PT Start Time 1105   PT Stop Time 1148   PT Time Calculation (min) 43 min   Activity Tolerance Patient tolerated treatment well      Past Medical History:  Diagnosis Date  . CHF (congestive heart failure) ([place])   . Chronic kidney disease    kidney fx studies increased   . Chronic lower back pain   . Claustrophobia   . Dysrhythmia    "palpitations"  . Exertional dyspnea 01/2012  . Heart murmur   . Hypertension   . Hypothyroidism   . Migraine 02/13/12   "opthalmic"  . Varicose vein of leg    right    Past Surgical History:  Procedure Laterality Date  . CARDIAC CATHETERIZATION N/A 02/17/2016   Procedure: Left Heart Cath and Coronary Angiography;  Surgeon: Jettie Booze, MD;  Location: [place] [facility];  Service: Cardiovascular;  Laterality: N/A;  . CARDIOVERSION  03/22/2012   Procedure: CARDIOVERSION;  Surgeon: Candee Furbish, MD;  Location: [facility] ENDOSCOPY;  Service: Cardiovascular;  Laterality: N/A;  . CARDIOVERSION  04/19/2012   Procedure: CARDIOVERSION;  Surgeon: Sinclair Grooms, MD;  Location: [place];  Service: Cardiovascular;  Laterality: N/A;  . COLONOSCOPY N/A 04/11/2013   Procedure: COLONOSCOPY;  Surgeon: Inda Castle, MD;  Location: [facility] ENDOSCOPY;  Service: Endoscopy;  Laterality: N/A;  . COLONOSCOPY N/A 04/11/2013   Procedure: COLONOSCOPY;  Surgeon: Inda Castle, MD;  Location: [facility] ENDOSCOPY;  Service: Endoscopy;  Laterality: N/A;  . EP IMPLANTABLE DEVICE N/A 02/17/2016   Procedure: BiV ICD Insertion  CRT-D;  Surgeon: Evans Lance, MD;  Location: [place] [facility];  Service: Cardiovascular;  Laterality: N/A;  . FINGER SURGERY  2012   "4th digit right hand; thumb on left hand"  . RADIOLOGY WITH ANESTHESIA N/A 02/11/2016   Procedure: MRI OF THE BRAIN WITHOUT CONTRAST, LUMBAR WITHOUT CONTRAST;  Surgeon: Medication Radiologist, MD;  Location: [place];  Service: Radiology;  Laterality: N/A;  DR. WOOD/MRI  . Skin melanocytoma excision  2012   "above left clavicle"  . TEE WITHOUT CARDIOVERSION  03/22/2012   Procedure: TRANSESOPHAGEAL ECHOCARDIOGRAM (TEE);  Surgeon: Candee Furbish, MD;  Location: [facility] ENDOSCOPY;  Service: Cardiovascular;  Laterality: N/A;  . TEE WITHOUT CARDIOVERSION N/A 02/08/2016   Procedure: TRANSESOPHAGEAL ECHOCARDIOGRAM (TEE);  Surgeon: Lelon Perla, MD;  Location: [facility] ENDOSCOPY;  Service: Cardiovascular;  Laterality: N/A;    There were no vitals filed for this visit.      Subjective Assessment - 06/30/16 1108    Subjective Tired from work but no excessive pain or soreness.     Currently in Pain? Yes   Pain Score 3    Pain Location Knee   Pain Orientation Right   Pain Type Chronic pain                         [facility] Adult PT Treatment/Exercise - 06/30/16 0001      Knee/Hip Exercises: Aerobic   Stationary Bike L1  x 8 min   Nustep L3 x 10 min     Knee/Hip Exercises: Machines for Strengthening   Total Gym Leg Press seat 9 left only 35# 2x10     Knee/Hip Exercises: Standing   Heel Raises Both;2 sets;10 reps   Hip ADduction Strengthening;Both;1 set;10 reps   Hip ADduction Limitations red band on Lt; WB on Rt   Hip Abduction Stengthening;Left;1 set;10 reps;Knee straight   Abduction Limitations red band on left, WB on right   Hip Extension Stengthening;Right;Left;1 set;10 reps;Knee straight   Extension Limitations red band on left ,WB on right   Forward Step Up Right;2 sets;10 reps;Hand Hold: 1;Step Height: 6"   Step Down Left;1 set;10 reps;Hand Hold:  2;Step Height: 2"                  PT Short Term Goals - 06/30/16 1159      PT SHORT TERM GOAL #1   Title independent with initial HEP    Status Achieved     PT SHORT TERM GOAL #2   Title be able to go to the gym to do the bike or elliptical for improving strength   Status Achieved     PT SHORT TERM GOAL #3   Title right knee extension AROM in sitting -30 degress due to increase strength   Time 4   Period Weeks   Status On-going     PT SHORT TERM GOAL #4   Title pain in right leg decreased >/= 25%    Status Achieved     PT SHORT TERM GOAL #5   Title able to get in and out of car without helping his right leg due to increased strength   Status Achieved           PT Long Term Goals - 06/30/16 1200      PT LONG TERM GOAL #1   Title independent with HEP and understand how to progress himself   Time 8   Period Weeks   Status On-going     PT LONG TERM GOAL #2   Title able to stand for 25 min with pain decreased >/= 50% so he is able to cook a meal.   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  He continues to lack quad motor control for terminal knee extension needed for this function.  Verbal and tactile cues to avoid knee hyperextension with full weight bearing on right.  Therapist closely monitoring for safety.     PT Next Visit Plan Closed chain RTLE exercises, leg press, Nustep, Bike end of session to relieve stiffness;, step downs.      Patient will benefit from skilled therapeutic intervention in order to improve the following deficits and impairments:     Visit Diagnosis: Right knee pain, unspecified chronicity  Other abnormalities of gait and mobility  Muscle weakness (generalized)     Problem List Patient Active Problem List   Diagnosis Date Noted  . Right ankle pain   . Pain   . Tibial pain   . Acute idiopathic gout of right ankle   . Right knee pain   . Diabetes mellitus type 2 in obese ([place])   . Labile blood pressure   . Acute blood loss anemia   . Diabetes mellitus type 2 in nonobese ([facility])   . Debility 02/22/2016  . 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 Weakness of both lower extremities   . Lower extremity weakness   . Pneumonia   . Acute lumbar back pain   . Non-traumatic rhabdomyolysis   . Retroperitoneal bleed   . Back pain   . Leg weakness, bilateral   . Paroxysmal atrial fibrillation ([place])   . Ventricular fibrillation ([place]) 02/09/2016  . Aortic aneurysm without rupture ([place]) 02/09/2016  . Aortic insufficiency 02/09/2016  . HCAP (healthcare-associated pneumonia)   . Cardiac arrest ([place]) 02/01/2016  . Acute on chronic systolic heart failure ([place])   . Acute encephalopathy   . Thyroid activity decreased   . Acute hypoxemic respiratory failure ([place])   . Acute respiratory failure ([place])   . Encounter for central line placement   . Arrhythmia   . Altered mental status   . CKD (chronic kidney  disease), stage IV ([place]) 08/06/2013    Class: Chronic  . Long term (current) use of anticoagulants 08/06/2013    Class: Chronic  . Special screening for malignant neoplasms, colon 04/11/2013  . Colon cancer screening 03/04/2013  . Chronic anticoagulation 03/04/2013  . Atrial flutter ([place]) 03/18/2012    Class: Acute  . Chronic systolic heart failure ([place]) 02/13/2012    Class: Acute  . Hypertension, accelerated 02/13/2012  . Aortic valve regurgitation, acquired 02/13/2012  . Aortic root enlargement ([place]) 02/13/2012  . Hyperlipidemia 02/13/2012  . Left bundle branch block 02/13/2012  . Obesity (BMI 30-39.9) 02/13/2012   Ruben Im, PT 06/30/16 12:02 PM Phone: [phone] Fax: [phone]  Alvera Singh 06/30/2016, 12:01 PM  [facility] [address], [place], [place], [address] Phone: [phone]   Fax:  [phone]  Name: Eric Lindon  MD MRN: [id number] Date of Birth: 1950/11/27

## 2016-07-03 ENCOUNTER — Ambulatory Visit: Payer: BC Managed Care – PPO

## 2016-07-03 DIAGNOSIS — M6281 Muscle weakness (generalized): Secondary | ICD-10-CM

## 2016-07-03 DIAGNOSIS — R2689 Other abnormalities of gait and mobility: Secondary | ICD-10-CM

## 2016-07-03 DIAGNOSIS — M25561 Pain in right knee: Secondary | ICD-10-CM

## 2016-07-03 NOTE — Therapy (Signed)
[facility] [address] [place], [place], [address] Phone: [phone]   Fax:  [phone]  Physical Therapy Treatment  Patient Details  Name: Eric Rathgeber  MD MRN: [id number] Date of Birth: 1951-06-21 Referring Provider: Dr. Roe Coombs  Encounter Date: 07/03/2016      PT End of Session - 07/03/16 1523    Visit Number 21   Date for PT Re-Evaluation 08/26/16   Authorization Type BCBS   PT Start Time 1450   PT Stop Time 1532   PT Time Calculation (min) 42 min   Activity Tolerance Patient tolerated treatment well   Behavior During Therapy [facility] for tasks assessed/performed      Past Medical History:  Diagnosis Date  . CHF (congestive heart failure) ([place])   . Chronic kidney disease    kidney fx studies increased   . Chronic lower back pain   . Claustrophobia   . Dysrhythmia    "palpitations"  . Exertional dyspnea 01/2012  . Heart murmur   . Hypertension   . Hypothyroidism   . Migraine 02/13/12   "opthalmic"  . Varicose vein of leg    right    Past Surgical History:  Procedure Laterality Date  . CARDIAC CATHETERIZATION N/A 02/17/2016   Procedure: Left Heart Cath and Coronary Angiography;  Surgeon: Jettie Booze, MD;  Location: [place] [facility];  Service: Cardiovascular;  Laterality: N/A;  . CARDIOVERSION  03/22/2012   Procedure: CARDIOVERSION;  Surgeon: Candee Furbish, MD;  Location: [facility] ENDOSCOPY;  Service: Cardiovascular;  Laterality: N/A;  . CARDIOVERSION  04/19/2012   Procedure: CARDIOVERSION;  Surgeon: Sinclair Grooms, MD;  Location: [place];  Service: Cardiovascular;  Laterality: N/A;  . COLONOSCOPY N/A 04/11/2013   Procedure: COLONOSCOPY;  Surgeon: Inda Castle, MD;  Location: [facility];  Service: Endoscopy;  Laterality: N/A;  . COLONOSCOPY N/A 04/11/2013   Procedure: COLONOSCOPY;  Surgeon: Inda Castle, MD;  Location: [facility] ENDOSCOPY;  Service: Endoscopy;  Laterality: N/A;  . EP  IMPLANTABLE DEVICE N/A 02/17/2016   Procedure: BiV ICD Insertion CRT-D;  Surgeon: Evans Lance, MD;  Location: [place] [facility];  Service: Cardiovascular;  Laterality: N/A;  . FINGER SURGERY  2012   "4th digit right hand; thumb on left hand"  . RADIOLOGY WITH ANESTHESIA N/A 02/11/2016   Procedure: MRI OF THE BRAIN WITHOUT CONTRAST, LUMBAR WITHOUT CONTRAST;  Surgeon: Medication Radiologist, MD;  Location: [place];  Service: Radiology;  Laterality: N/A;  DR. WOOD/MRI  . Skin melanocytoma excision  2012   "above left clavicle"  . TEE WITHOUT CARDIOVERSION  03/22/2012   Procedure: TRANSESOPHAGEAL ECHOCARDIOGRAM (TEE);  Surgeon: Candee Furbish, MD;  Location: [facility] ENDOSCOPY;  Service: Cardiovascular;  Laterality: N/A;  . TEE WITHOUT CARDIOVERSION N/A 02/08/2016   Procedure: TRANSESOPHAGEAL ECHOCARDIOGRAM (TEE);  Surgeon: Lelon Perla, MD;  Location: [facility] ENDOSCOPY;  Service: Cardiovascular;  Laterality: N/A;    There were no vitals filed for this visit.      Subjective Assessment - 07/03/16 1453    Subjective Pt is working part time.  Rt leg feels stiff but no significant pain.     Patient Stated Goals regain previous mobility.  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 No/denies                         [facility] Adult  PT Treatment/Exercise - 07/03/16 0001      Knee/Hip Exercises: Aerobic   Stationary Bike L2 x 8 min   Nustep L3 x 10 min     Knee/Hip Exercises: Machines for Strengthening   Total Gym Leg Press seat #9 65# 3x10, Rt LE 30# 3x10     Knee/Hip Exercises: Standing   Heel Raises Both;2 sets;10 reps   Hip ADduction Strengthening;Both;10 reps;2 sets   Hip ADduction Limitations red band on Lt; WB on Rt   Hip Abduction Stengthening;Left;10 reps;Knee straight;2 sets   Abduction Limitations red band on left, WB on right   Hip Extension Stengthening;Right;Left;10 reps;Knee straight;2 sets    Extension Limitations red band on left ,WB on right   Forward Step Up Right;2 sets;10 reps;Hand Hold: 1;Step Height: 6"   Step Down Left;1 set;10 reps;Hand Hold: 2;Step Height: 2"                  PT Short Term Goals - 07/03/16 1454      PT SHORT TERM GOAL #3   Title right knee extension AROM in sitting -30 degress due to increase strength   Time 4   Period Weeks   Status On-going     PT SHORT TERM GOAL #4   Title pain in right leg decreased >/= 25%    Status Achieved     PT SHORT TERM GOAL #5   Title able to get in and out of car without helping his right leg due to increased strength   Status Achieved           PT Long Term Goals - 07/03/16 1454      PT LONG TERM GOAL #1   Title independent with HEP and understand how to progress himself   Time 8   Period Weeks   Status On-going     PT LONG TERM GOAL #2   Title able to stand for 25 min with pain decreased >/= 50% so he is able to cook a meal.   Time 8   Period Weeks   Status On-going     PT LONG TERM GOAL #6   Title return to driving due to increased right lower extremity strength >/= 4/5   Time 8   Period Weeks   Status On-going               Plan - 07/03/16 1455    Clinical Impression Statement Pt is making steady progress regarding strength and endurance.  Pt is working part time and is taking breaks as needed.  Pt is not driving yet.  Pt continues to lack quad motor control for terminal knee extension on the Rt.  Pt tolerates advancement of exercise in the clinic.  Pt will continue to benefit from skilled PT for LE strength and endurance to allow for driving and gait without device.     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   PT Frequency 3x / week   PT Duration Other (comment)   PT Treatment/Interventions ADLs/Self Care Home Management;Cryotherapy;Electrical Stimulation;Functional mobility  training;Stair training;Gait training;Ultrasound;Moist Heat;Therapeutic activities;Therapeutic exercise;Balance training;Neuromuscular re-education;Patient/family education;Passive range of motion;Manual techniques;Energy conservation;Other (comment)   PT Next Visit Plan Closed chain RTLE exercises, leg press, Nustep/bike, step downs.   Consulted and Agree with Plan of Care Patient      Patient will benefit from skilled therapeutic intervention in order to improve the following deficits and impairments:  Abnormal gait,  Difficulty walking, Decreased endurance, Pain, Decreased activity tolerance, Decreased balance, Decreased strength, Decreased mobility  Visit Diagnosis: Right knee pain, unspecified chronicity  Other abnormalities of gait and mobility  Muscle weakness (generalized)     Problem List Patient Active Problem List   Diagnosis Date Noted  . Right ankle pain   . Pain   . Tibial pain   . Acute idiopathic gout of right ankle   . Right knee pain   . Diabetes mellitus type 2 in obese ([place])   . Labile blood pressure   . Acute blood loss anemia   . Diabetes mellitus type 2 in nonobese ([facility])   . Debility 02/22/2016  . 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 Weakness of both lower extremities   . Lower extremity weakness   . Pneumonia   . Acute lumbar back pain   . Non-traumatic rhabdomyolysis   . Retroperitoneal bleed   . Back pain   . Leg weakness, bilateral   . Paroxysmal atrial fibrillation ([place])   . Ventricular fibrillation ([place]) 02/09/2016  . Aortic aneurysm without rupture ([place]) 02/09/2016  . Aortic insufficiency 02/09/2016  . HCAP (healthcare-associated pneumonia)   . Cardiac arrest ([place]) 02/01/2016  . Acute on chronic systolic heart failure ([place])   . Acute encephalopathy   . Thyroid activity decreased   . Acute hypoxemic respiratory failure ([place])   . Acute respiratory failure ([place])   . Encounter for central line placement   . Arrhythmia    . Altered mental status   . CKD (chronic kidney disease), stage IV ([place]) 08/06/2013    Class: Chronic  . Long term (current) use of anticoagulants 08/06/2013    Class: Chronic  . Special screening for malignant neoplasms, colon 04/11/2013  . Colon cancer screening 03/04/2013  . Chronic anticoagulation 03/04/2013  . Atrial flutter ([place]) 03/18/2012    Class: Acute  . Chronic systolic heart failure ([place]) 02/13/2012    Class: Acute  . Hypertension, accelerated 02/13/2012  . Aortic valve regurgitation, acquired 02/13/2012  . Aortic root enlargement ([place]) 02/13/2012  . Hyperlipidemia 02/13/2012  . Left bundle branch block 02/13/2012  . Obesity (BMI 30-39.9) 02/13/2012   Sigurd Sos, PT 07/03/16 3:25 PM  [facility] [address] [place], [place], [address] Phone: [phone]   Fax:  [phone]  Name: Eric Cham  MD MRN: [id number] Date of Birth: 04/02/1951

## 2016-07-05 ENCOUNTER — Ambulatory Visit: Payer: BC Managed Care – PPO | Admitting: Physical Therapy

## 2016-07-05 ENCOUNTER — Encounter: Payer: Self-pay | Admitting: Physical Therapy

## 2016-07-05 DIAGNOSIS — M6281 Muscle weakness (generalized): Secondary | ICD-10-CM

## 2016-07-05 DIAGNOSIS — M25561 Pain in right knee: Secondary | ICD-10-CM | POA: Diagnosis not present

## 2016-07-05 NOTE — Therapy (Signed)
[facility] [address], [place], [place], [address] Phone: [phone]   Fax:  [phone]  Physical Therapy Treatment  Patient Details  Name: Eric Covault  MD MRN: [id number] Date of Birth: May 04, 1951 Referring Provider: Dr. Roe Coombs  Encounter Date: 07/05/2016      PT End of Session - 07/05/16 1451    Visit Number 22   Date for PT Re-Evaluation 08/26/16   Authorization Type BCBS   PT Start Time 1447   PT Stop Time 1535   PT Time Calculation (min) 48 min   Activity Tolerance Patient tolerated treatment well   Behavior During Therapy [facility] for tasks assessed/performed      Past Medical History:  Diagnosis Date  . CHF (congestive heart failure) ([place])   . Chronic kidney disease    kidney fx studies increased   . Chronic lower back pain   . Claustrophobia   . Dysrhythmia    "palpitations"  . Exertional dyspnea 01/2012  . Heart murmur   . Hypertension   . Hypothyroidism   . Migraine 02/13/12   "opthalmic"  . Varicose vein of leg    right    Past Surgical History:  Procedure Laterality Date  . CARDIAC CATHETERIZATION N/A 02/17/2016   Procedure: Left Heart Cath and Coronary Angiography;  Surgeon: Jettie Booze, MD;  Location: [place] [facility];  Service: Cardiovascular;  Laterality: N/A;  . CARDIOVERSION  03/22/2012   Procedure: CARDIOVERSION;  Surgeon: Candee Furbish, MD;  Location: [facility] ENDOSCOPY;  Service: Cardiovascular;  Laterality: N/A;  . CARDIOVERSION  04/19/2012   Procedure: CARDIOVERSION;  Surgeon: Sinclair Grooms, MD;  Location: [place];  Service: Cardiovascular;  Laterality: N/A;  . COLONOSCOPY N/A 04/11/2013   Procedure: COLONOSCOPY;  Surgeon: Inda Castle, MD;  Location: [facility] ENDOSCOPY;  Service: Endoscopy;  Laterality: N/A;  . COLONOSCOPY N/A 04/11/2013   Procedure: COLONOSCOPY;  Surgeon: Inda Castle, MD;  Location: [facility] ENDOSCOPY;  Service: Endoscopy;  Laterality: N/A;  . EP  IMPLANTABLE DEVICE N/A 02/17/2016   Procedure: BiV ICD Insertion CRT-D;  Surgeon: Evans Lance, MD;  Location: [place] [facility];  Service: Cardiovascular;  Laterality: N/A;  . FINGER SURGERY  2012   "4th digit right hand; thumb on left hand"  . RADIOLOGY WITH ANESTHESIA N/A 02/11/2016   Procedure: MRI OF THE BRAIN WITHOUT CONTRAST, LUMBAR WITHOUT CONTRAST;  Surgeon: Medication Radiologist, MD;  Location: [place];  Service: Radiology;  Laterality: N/A;  DR. WOOD/MRI  . Skin melanocytoma excision  2012   "above left clavicle"  . TEE WITHOUT CARDIOVERSION  03/22/2012   Procedure: TRANSESOPHAGEAL ECHOCARDIOGRAM (TEE);  Surgeon: Candee Furbish, MD;  Location: [facility] ENDOSCOPY;  Service: Cardiovascular;  Laterality: N/A;  . TEE WITHOUT CARDIOVERSION N/A 02/08/2016   Procedure: TRANSESOPHAGEAL ECHOCARDIOGRAM (TEE);  Surgeon: Lelon Perla, MD;  Location: [facility] ENDOSCOPY;  Service: Cardiovascular;  Laterality: N/A;    There were no vitals filed for this visit.      Subjective Assessment - 07/05/16 1453    Subjective Just tightness around the knee.    Currently in Pain? No/denies   Pain Location Knee   Pain Orientation Right   Pain Descriptors / Indicators Tightness   Multiple Pain Sites No                         [facility] Adult PT Treatment/Exercise - 07/05/16 0001      Knee/Hip Exercises: Aerobic  Stationary Bike L2 x 10 min   Nustep L2 10 min total: 30 sec with speed, 1 min rest intervals.     Knee/Hip Exercises: Machines for Strengthening   Cybex Knee Extension 1 plate eccentrics   Pt could not do   Total Gym Leg Press Seat #9 70# 3x10, RTLE 30# 10x, 35# 10x     Knee/Hip Exercises: Standing   Forward Step Up Right;1 set;10 reps;Hand Hold: 2;Step Height: 6"  Did box vs stair: could not do 8 inch box     Knee/Hip Exercises: Seated   Long Arc Quad --  LTLE assist in concentric LAQ/eccentric lower 2x 10                  PT Short Term Goals - 07/03/16 1454       PT SHORT TERM GOAL #3   Title right knee extension AROM in sitting -30 degress due to increase strength   Time 4   Period Weeks   Status On-going     PT SHORT TERM GOAL #4   Title pain in right leg decreased >/= 25%    Status Achieved     PT SHORT TERM GOAL #5   Title able to get in and out of car without helping his right leg due to increased strength   Status Achieved           PT Long Term Goals - 07/03/16 1454      PT LONG TERM GOAL #1   Title independent with HEP and understand how to progress himself   Time 8   Period Weeks   Status On-going     PT LONG TERM GOAL #2   Title able to stand for 25 min with pain decreased >/= 50% so he is able to cook a meal.   Time 8   Period Weeks   Status On-going     PT LONG TERM GOAL #6   Title return to driving due to increased right lower extremity strength >/= 4/5   Time 8   Period Weeks   Status On-going               Plan - 07/05/16 1451    Clinical Impression Statement Pt cannot extend his knee in open chain. Tried to incorporate assisted LAQ with LTLE focusing on eccentrics. Able to increase weight on the leg press today. Pt plans to decrease frequency to 2x week next week to work more.    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   PT Duration Other (comment)   PT Treatment/Interventions ADLs/Self Care Home Management;Cryotherapy;Electrical Stimulation;Functional mobility training;Stair training;Gait training;Ultrasound;Moist Heat;Therapeutic activities;Therapeutic exercise;Balance training;Neuromuscular re-education;Patient/family education;Passive range of motion;Manual techniques;Energy conservation;Other (comment)   PT Next Visit Plan To MD next, write note on Friday   Consulted and Agree with Plan of Care Patient      Patient will benefit from skilled therapeutic intervention in order to improve the following  deficits and impairments:  Abnormal gait, Difficulty walking, Decreased endurance, Pain, Decreased activity tolerance, Decreased balance, Decreased strength, Decreased mobility  Visit Diagnosis: Right knee pain, unspecified chronicity  Muscle weakness (generalized)     Problem List Patient Active Problem List   Diagnosis Date Noted  . Right ankle pain   . Pain   . Tibial pain   . Acute idiopathic gout of right ankle   . Right knee pain   . Diabetes mellitus type 2  in obese ([place])   . Labile blood pressure   . Acute blood loss anemia   . Diabetes mellitus type 2 in nonobese ([facility])   . Debility 02/22/2016  . 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 Weakness of both lower extremities   . Lower extremity weakness   . Pneumonia   . Acute lumbar back pain   . Non-traumatic rhabdomyolysis   . Retroperitoneal bleed   . Back pain   . Leg weakness, bilateral   . Paroxysmal atrial fibrillation ([place])   . Ventricular fibrillation ([place]) 02/09/2016  . Aortic aneurysm without rupture ([place]) 02/09/2016  . Aortic insufficiency 02/09/2016  . HCAP (healthcare-associated pneumonia)   . Cardiac arrest ([place]) 02/01/2016  . Acute on chronic systolic heart failure ([place])   . Acute encephalopathy   . Thyroid activity decreased   . Acute hypoxemic respiratory failure ([place])   . Acute respiratory failure ([place])   . Encounter for central line placement   . Arrhythmia   . Altered mental status   . CKD (chronic kidney disease), stage IV ([place]) 08/06/2013    Class: Chronic  . Long term (current) use of anticoagulants 08/06/2013    Class: Chronic  . Special screening for malignant neoplasms, colon 04/11/2013  . Colon cancer screening 03/04/2013  . Chronic anticoagulation 03/04/2013  . Atrial flutter ([place]) 03/18/2012    Class: Acute  . Chronic systolic heart failure ([place]) 02/13/2012    Class: Acute  . Hypertension, accelerated 02/13/2012  . Aortic valve regurgitation, acquired  02/13/2012  . Aortic root enlargement ([place]) 02/13/2012  . Hyperlipidemia 02/13/2012  . Left bundle branch block 02/13/2012  . Obesity (BMI 30-39.9) 02/13/2012    Amay Mijangos, PTA 07/05/2016, 3:27 PM  [facility] [address], [place], [place], [address] Phone: [phone]   Fax:  [phone]  Name: Eric Strauch  MD MRN: [id number] Date of Birth: 17-Sep-1951

## 2016-07-07 ENCOUNTER — Encounter: Payer: Self-pay | Admitting: Physical Therapy

## 2016-07-07 ENCOUNTER — Ambulatory Visit: Payer: BC Managed Care – PPO | Admitting: Physical Therapy

## 2016-07-07 DIAGNOSIS — M25561 Pain in right knee: Secondary | ICD-10-CM

## 2016-07-07 DIAGNOSIS — M6281 Muscle weakness (generalized): Secondary | ICD-10-CM

## 2016-07-07 NOTE — Therapy (Signed)
[facility] [address] [place], [place], [address] Phone: [phone]   Fax:  [phone]  Physical Therapy Treatment  Patient Details  Name: Eric Macaraeg  MD MRN: [id number] Date of Birth: 1950/10/20 Referring Provider: Dr. Roe Coombs  Encounter Date: 07/07/2016      PT End of Session - 07/07/16 1108    Visit Number 23   Date for PT Re-Evaluation 08/26/16   Authorization Type BCBS   PT Start Time 1105  Has MD appt   PT Stop Time 1134   PT Time Calculation (min) 29 min   Activity Tolerance Patient tolerated treatment well   Behavior During Therapy [facility] for tasks assessed/performed      Past Medical History:  Diagnosis Date  . CHF (congestive heart failure) ([place])   . Chronic kidney disease    kidney fx studies increased   . Chronic lower back pain   . Claustrophobia   . Dysrhythmia    "palpitations"  . Exertional dyspnea 01/2012  . Heart murmur   . Hypertension   . Hypothyroidism   . Migraine 02/13/12   "opthalmic"  . Varicose vein of leg    right    Past Surgical History:  Procedure Laterality Date  . CARDIAC CATHETERIZATION N/A 02/17/2016   Procedure: Left Heart Cath and Coronary Angiography;  Surgeon: Jettie Booze, MD;  Location: [place] [facility];  Service: Cardiovascular;  Laterality: N/A;  . CARDIOVERSION  03/22/2012   Procedure: CARDIOVERSION;  Surgeon: Candee Furbish, MD;  Location: [facility] ENDOSCOPY;  Service: Cardiovascular;  Laterality: N/A;  . CARDIOVERSION  04/19/2012   Procedure: CARDIOVERSION;  Surgeon: Sinclair Grooms, MD;  Location: [place];  Service: Cardiovascular;  Laterality: N/A;  . COLONOSCOPY N/A 04/11/2013   Procedure: COLONOSCOPY;  Surgeon: Inda Castle, MD;  Location: [facility] ENDOSCOPY;  Service: Endoscopy;  Laterality: N/A;  . COLONOSCOPY N/A 04/11/2013   Procedure: COLONOSCOPY;  Surgeon: Inda Castle, MD;  Location: [facility] ENDOSCOPY;  Service: Endoscopy;  Laterality:  N/A;  . EP IMPLANTABLE DEVICE N/A 02/17/2016   Procedure: BiV ICD Insertion CRT-D;  Surgeon: Evans Lance, MD;  Location: [place] [facility];  Service: Cardiovascular;  Laterality: N/A;  . FINGER SURGERY  2012   "4th digit right hand; thumb on left hand"  . RADIOLOGY WITH ANESTHESIA N/A 02/11/2016   Procedure: MRI OF THE BRAIN WITHOUT CONTRAST, LUMBAR WITHOUT CONTRAST;  Surgeon: Medication Radiologist, MD;  Location: [place];  Service: Radiology;  Laterality: N/A;  DR. WOOD/MRI  . Skin melanocytoma excision  2012   "above left clavicle"  . TEE WITHOUT CARDIOVERSION  03/22/2012   Procedure: TRANSESOPHAGEAL ECHOCARDIOGRAM (TEE);  Surgeon: Candee Furbish, MD;  Location: [facility] ENDOSCOPY;  Service: Cardiovascular;  Laterality: N/A;  . TEE WITHOUT CARDIOVERSION N/A 02/08/2016   Procedure: TRANSESOPHAGEAL ECHOCARDIOGRAM (TEE);  Surgeon: Lelon Perla, MD;  Location: [facility] ENDOSCOPY;  Service: Cardiovascular;  Laterality: N/A;    There were no vitals filed for this visit.      Subjective Assessment - 07/07/16 1114    Subjective I had some quad soreness from last treatment.   Currently in Pain? No/denies   Pain Location Knee   Pain Orientation Right   Pain Descriptors / Indicators Tightness   Multiple Pain Sites No            [facility] PT Assessment - 07/07/16 0001      Strength   Right Hip Flexion 4+/5   Right Hip  Extension 4+/5   Right Hip ABduction 3+/5   Right Hip ADduction 5/5   Right Knee Flexion 4+/5   Right Knee Extension 2+/5  In sitting pt cannot extend the knee with full ROM                     [facility] Adult PT Treatment/Exercise - 07/07/16 0001      Knee/Hip Exercises: Aerobic   Stationary Bike L2 x 10 min     Knee/Hip Exercises: Machines for Strengthening   Total Gym Leg Press Seat #9 70# 3x10, RTLE 30# 10x, 35# 10x                  PT Short Term Goals - 07/03/16 1454      PT SHORT TERM GOAL #3   Title right knee extension AROM in sitting -30  degress due to increase strength   Time 4   Period Weeks   Status On-going     PT SHORT TERM GOAL #4   Title pain in right leg decreased >/= 25%    Status Achieved     PT SHORT TERM GOAL #5   Title able to get in and out of car without helping his right leg due to increased strength   Status Achieved           PT Long Term Goals - 07/03/16 1454      PT LONG TERM GOAL #1   Title independent with HEP and understand how to progress himself   Time 8   Period Weeks   Status On-going     PT LONG TERM GOAL #2   Title able to stand for 25 min with pain decreased >/= 50% so he is able to cook a meal.   Time 8   Period Weeks   Status On-going     PT LONG TERM GOAL #6   Title return to driving due to increased right lower extremity strength >/= 4/5   Time 8   Period Weeks   Status On-going               Plan - 07/07/16 1113    Clinical Impression Statement Pt still cannot extend the knee in open chain position, although when trying he demonstrates better control within the motion he can extend to which is maybe 30-40 degrees. All other LE muscles have strengthened.     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   PT Frequency 3x / week   PT Duration Other (comment)   PT Treatment/Interventions ADLs/Self Care Home Management;Cryotherapy;Electrical Stimulation;Functional mobility training;Stair training;Gait training;Ultrasound;Moist Heat;Therapeutic activities;Therapeutic exercise;Balance training;Neuromuscular re-education;Patient/family education;Passive range of motion;Manual techniques;Energy conservation;Other (comment)   PT Next Visit Plan To MD, send note.   Consulted and Agree with Plan of Care Patient      Patient will benefit from skilled therapeutic intervention in order to improve the following deficits and impairments:  Abnormal gait, Difficulty walking,  Decreased endurance, Pain, Decreased activity tolerance, Decreased balance, Decreased strength, Decreased mobility  Visit Diagnosis: Right knee pain, unspecified chronicity  Muscle weakness (generalized)     Problem List Patient Active Problem List   Diagnosis Date Noted  . Right ankle pain   . Pain   . Tibial pain   . Acute idiopathic gout of right ankle   . Right knee pain   . Diabetes mellitus type 2 in obese ([place])   . Labile  blood pressure   . Acute blood loss anemia   . Diabetes mellitus type 2 in nonobese ([facility])   . Debility 02/22/2016  . 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 Weakness of both lower extremities   . Lower extremity weakness   . Pneumonia   . Acute lumbar back pain   . Non-traumatic rhabdomyolysis   . Retroperitoneal bleed   . Back pain   . Leg weakness, bilateral   . Paroxysmal atrial fibrillation ([place])   . Ventricular fibrillation ([place]) 02/09/2016  . Aortic aneurysm without rupture ([place]) 02/09/2016  . Aortic insufficiency 02/09/2016  . HCAP (healthcare-associated pneumonia)   . Cardiac arrest ([place]) 02/01/2016  . Acute on chronic systolic heart failure ([place])   . Acute encephalopathy   . Thyroid activity decreased   . Acute hypoxemic respiratory failure ([place])   . Acute respiratory failure ([place])   . Encounter for central line placement   . Arrhythmia   . Altered mental status   . CKD (chronic kidney disease), stage IV ([place]) 08/06/2013    Class: Chronic  . Long term (current) use of anticoagulants 08/06/2013    Class: Chronic  . Special screening for malignant neoplasms, colon 04/11/2013  . Colon cancer screening 03/04/2013  . Chronic anticoagulation 03/04/2013  . Atrial flutter ([place]) 03/18/2012    Class: Acute  . Chronic systolic heart failure ([place]) 02/13/2012    Class: Acute  . Hypertension, accelerated 02/13/2012  . Aortic valve regurgitation, acquired 02/13/2012  . Aortic root enlargement ([place]) 02/13/2012  .  Hyperlipidemia 02/13/2012  . Left bundle branch block 02/13/2012  . Obesity (BMI 30-39.9) 02/13/2012    COCHRAN,JENNIFER, PTA 07/07/2016, 11:37 AM  [facility] [address], [place], [place], [address] Phone: [phone]   Fax:  [phone]  Name: Eric Devonshire  MD MRN: [id number] Date of Birth: Jan 24, 1951

## 2016-07-10 ENCOUNTER — Encounter: Payer: Self-pay | Admitting: Physical Therapy

## 2016-07-10 ENCOUNTER — Ambulatory Visit: Payer: BC Managed Care – PPO | Admitting: Physical Therapy

## 2016-07-10 DIAGNOSIS — M545 Low back pain, unspecified: Secondary | ICD-10-CM

## 2016-07-10 DIAGNOSIS — M25561 Pain in right knee: Secondary | ICD-10-CM | POA: Diagnosis not present

## 2016-07-10 DIAGNOSIS — M6281 Muscle weakness (generalized): Secondary | ICD-10-CM

## 2016-07-10 DIAGNOSIS — G8929 Other chronic pain: Secondary | ICD-10-CM

## 2016-07-10 DIAGNOSIS — R2689 Other abnormalities of gait and mobility: Secondary | ICD-10-CM

## 2016-07-10 NOTE — Therapy (Signed)
[facility] [address] [place], [place], [address] Phone: [phone]   Fax:  [phone]  Physical Therapy Treatment  Patient Details  Name: Eric Faulcon  MD MRN: [id number] Date of Birth: 01/18/1951 Referring Provider: Dr. Roe Coombs  Encounter Date: 07/10/2016      PT End of Session - 07/10/16 1616    Visit Number 24   Date for PT Re-Evaluation 08/26/16   Authorization Type BCBS   PT Start Time 1612   PT Stop Time 1655   PT Time Calculation (min) 43 min   Activity Tolerance Patient tolerated treatment well   Behavior During Therapy [facility] for tasks assessed/performed      Past Medical History:  Diagnosis Date  . CHF (congestive heart failure) ([place])   . Chronic kidney disease    kidney fx studies increased   . Chronic lower back pain   . Claustrophobia   . Dysrhythmia    "palpitations"  . Exertional dyspnea 01/2012  . Heart murmur   . Hypertension   . Hypothyroidism   . Migraine 02/13/12   "opthalmic"  . Varicose vein of leg    right    Past Surgical History:  Procedure Laterality Date  . CARDIAC CATHETERIZATION N/A 02/17/2016   Procedure: Left Heart Cath and Coronary Angiography;  Surgeon: Jettie Booze, MD;  Location: [place] CV [facility];  Service: Cardiovascular;  Laterality: N/A;  . CARDIOVERSION  03/22/2012   Procedure: CARDIOVERSION;  Surgeon: Candee Furbish, MD;  Location: [facility] ENDOSCOPY;  Service: Cardiovascular;  Laterality: N/A;  . CARDIOVERSION  04/19/2012   Procedure: CARDIOVERSION;  Surgeon: Sinclair Grooms, MD;  Location: [place];  Service: Cardiovascular;  Laterality: N/A;  . COLONOSCOPY N/A 04/11/2013   Procedure: COLONOSCOPY;  Surgeon: Inda Castle, MD;  Location: [facility] ENDOSCOPY;  Service: Endoscopy;  Laterality: N/A;  . COLONOSCOPY N/A 04/11/2013   Procedure: COLONOSCOPY;  Surgeon: Inda Castle, MD;  Location: [facility] ENDOSCOPY;  Service: Endoscopy;  Laterality: N/A;  . EP  IMPLANTABLE DEVICE N/A 02/17/2016   Procedure: BiV ICD Insertion CRT-D;  Surgeon: Evans Lance, MD;  Location: [place] CV LAB;  Service: Cardiovascular;  Laterality: N/A;  . FINGER SURGERY  2012   "4th digit right hand; thumb on left hand"  . RADIOLOGY WITH ANESTHESIA N/A 02/11/2016   Procedure: MRI OF THE BRAIN WITHOUT CONTRAST, LUMBAR WITHOUT CONTRAST;  Surgeon: Medication Radiologist, MD;  Location: [place];  Service: Radiology;  Laterality: N/A;  DR. WOOD/MRI  . Skin melanocytoma excision  2012   "above left clavicle"  . TEE WITHOUT CARDIOVERSION  03/22/2012   Procedure: TRANSESOPHAGEAL ECHOCARDIOGRAM (TEE);  Surgeon: Candee Furbish, MD;  Location: [facility] ENDOSCOPY;  Service: Cardiovascular;  Laterality: N/A;  . TEE WITHOUT CARDIOVERSION N/A 02/08/2016   Procedure: TRANSESOPHAGEAL ECHOCARDIOGRAM (TEE);  Surgeon: Lelon Perla, MD;  Location: [facility] ENDOSCOPY;  Service: Cardiovascular;  Laterality: N/A;    There were no vitals filed for this visit.      Subjective Assessment - 07/10/16 1614    Subjective Took pain meds earlier due to pain feels better now. Pt walked alot yesterday which cause some soreness.    Patient is accompained by: Family member   How long can you sit comfortably? sitting for 45 min and right leg gets numb   How long can you stand comfortably? without walker 60 sec.    How long can you walk comfortably? walks with rolling waker and pressure in right knee  Patient Stated Goals regain previous mobility.  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 Yes   Pain Score 3    Pain Location Knee   Pain Orientation Right   Pain Descriptors / Indicators Tightness   Pain Type Chronic pain   Pain Onset More than a month ago                         [facility] Adult PT Treatment/Exercise - 07/10/16 0001      Knee/Hip Exercises: Aerobic   Nustep L2 10 min     Knee/Hip Exercises: Machines  for Strengthening   Total Gym Leg Press Seat #9 75# 2x15, RTLE 3x10 35# 10x     Knee/Hip Exercises: Standing   Lateral Step Up 2 sets;10 reps;Right;Step Height: 4";Hand Hold: 1   Forward Step Up Right;1 set;10 reps;Hand Hold: 2;Step Height: 6"  10 lead Lt foot 10 lead Rt foot     Knee/Hip Exercises: Seated   Long Arc Quad --  LTLE assist in concentric LAQ/eccentric lower 2x 10   Sit to [employer] 2 sets;5 reps     Knee/Hip Exercises: Supine   Quad Sets 2 sets;10 reps   Short Arc Quad Sets AAROM;3 sets;10 reps   Knee Extension Strengthening;Right;3 sets;10 reps                  PT Short Term Goals - 07/10/16 1616      PT SHORT TERM GOAL #3   Title right knee extension AROM in sitting -30 degress due to increase strength   Time 4   Period Weeks   Status On-going     PT SHORT TERM GOAL #4   Title pain in right leg decreased >/= 25%    Time 4   Period Weeks   Status Achieved     PT SHORT TERM GOAL #5   Title able to get in and out of car without helping his right leg due to increased strength   Time 4   Period Weeks   Status Achieved           PT Long Term Goals - 07/10/16 1617      PT LONG TERM GOAL #1   Title independent with HEP and understand how to progress himself   Time 8   Period Weeks   Status On-going     PT LONG TERM GOAL #2   Title able to stand for 25 min with pain decreased >/= 50% so he is able to cook a meal.   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 Able to tolerate all strenghtening exercises well with some fatigue. AAROM with mat exercises to facilitate knee extension. Pt will continue to benefit from skilled therapy for knee strength and ROM to return to prior level of function.    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   PT Frequency 3x / week   PT Duration Other (comment)   PT Treatment/Interventions ADLs/Self Care Home Management;Cryotherapy;Electrical Stimulation;Functional mobility training;Stair training;Gait training;Ultrasound;Moist Heat;Therapeutic activities;Therapeutic exercise;Balance training;Neuromuscular re-education;Patient/family education;Passive range of motion;Manual techniques;Energy conservation;Other (comment)   PT Next Visit Plan Continue knee strengthening, AAROM in open chain   Consulted and Agree with Plan of Care Patient      Patient will benefit from skilled therapeutic intervention in order to improve the following deficits and impairments:  Abnormal gait, Difficulty walking, Decreased endurance, Pain, Decreased activity tolerance, Decreased balance, Decreased strength, Decreased mobility  Visit Diagnosis: Right knee pain, unspecified chronicity  Muscle weakness (generalized)  Other abnormalities of gait and mobility  Left-sided low back pain without sciatica, unspecified chronicity  Chronic pain of right knee     Problem List Patient Active Problem List   Diagnosis Date Noted  . Right ankle pain   . Pain   . Tibial pain   . Acute idiopathic gout of right ankle   . Right knee  pain   . Diabetes mellitus type 2 in obese ([place])   . Labile blood pressure   . Acute blood loss anemia   . Diabetes mellitus type 2 in nonobese ([facility])   . Debility 02/22/2016  . 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 Weakness of both lower extremities   . Lower extremity weakness   . Pneumonia   . Acute lumbar back pain   . Non-traumatic rhabdomyolysis   . Retroperitoneal bleed   . Back pain   . Leg weakness, bilateral   . Paroxysmal atrial fibrillation ([place])   . Ventricular fibrillation ([place]) 02/09/2016  . Aortic aneurysm without rupture ([place]) 02/09/2016  . Aortic insufficiency 02/09/2016  . HCAP (healthcare-associated pneumonia)   . Cardiac arrest ([place]) 02/01/2016  . Acute on chronic systolic heart failure ([place])   . Acute encephalopathy   . Thyroid activity decreased   . Acute hypoxemic respiratory failure ([place])   . Acute respiratory failure ([place])   . Encounter for central line placement   . Arrhythmia   . Altered mental status   . CKD (chronic kidney disease), stage IV ([place]) 08/06/2013    Class: Chronic  . Long term (current) use of anticoagulants 08/06/2013    Class: Chronic  . Special screening for malignant neoplasms, colon 04/11/2013  . Colon cancer screening 03/04/2013  . Chronic anticoagulation 03/04/2013  . Atrial flutter ([place]) 03/18/2012    Class: Acute  . Chronic systolic heart failure ([place]) 02/13/2012    Class: Acute  . Hypertension, accelerated 02/13/2012  . Aortic valve regurgitation, acquired 02/13/2012  . Aortic root enlargement ([place]) 02/13/2012  . Hyperlipidemia 02/13/2012  . Left bundle branch block 02/13/2012  . Obesity (BMI  30-39.9) 02/13/2012    Mikle Bosworth PTA 07/10/2016, 5:01 PM  [facility] [address] [place], [place], [address] Phone: [phone]   Fax:  [phone]  Name: Tamer Baughman  MD MRN: [id number] Date of Birth: 1951/04/27

## 2016-07-12 ENCOUNTER — Encounter: Payer: Self-pay | Admitting: Physical Therapy

## 2016-07-12 ENCOUNTER — Ambulatory Visit: Payer: BC Managed Care – PPO | Admitting: Physical Therapy

## 2016-07-12 DIAGNOSIS — M25561 Pain in right knee: Secondary | ICD-10-CM

## 2016-07-12 DIAGNOSIS — R2689 Other abnormalities of gait and mobility: Secondary | ICD-10-CM

## 2016-07-12 DIAGNOSIS — M6281 Muscle weakness (generalized): Secondary | ICD-10-CM

## 2016-07-12 NOTE — Therapy (Signed)
[facility] [address], [place], [place], [address] Phone: [phone]   Fax:  [phone]  Physical Therapy Treatment  Patient Details  Name: Eric Janis  MD MRN: [id number] Date of Birth: 1950-11-15 Referring Provider: Dr. Roe Coombs  Encounter Date: 07/12/2016      PT End of Session - 07/12/16 1238    Visit Number 25   Date for PT Re-Evaluation 08/26/16   Authorization Type BCBS   PT Start Time 1233   PT Stop Time 1310   PT Time Calculation (min) 37 min      Past Medical History:  Diagnosis Date  . CHF (congestive heart failure) ([place])   . Chronic kidney disease    kidney fx studies increased   . Chronic lower back pain   . Claustrophobia   . Dysrhythmia    "palpitations"  . Exertional dyspnea 01/2012  . Heart murmur   . Hypertension   . Hypothyroidism   . Migraine 02/13/12   "opthalmic"  . Varicose vein of leg    right    Past Surgical History:  Procedure Laterality Date  . CARDIAC CATHETERIZATION N/A 02/17/2016   Procedure: Left Heart Cath and Coronary Angiography;  Surgeon: Jettie Booze, MD;  Location: [place] CV LAB;  Service: Cardiovascular;  Laterality: N/A;  . CARDIOVERSION  03/22/2012   Procedure: CARDIOVERSION;  Surgeon: Candee Furbish, MD;  Location: [facility] ENDOSCOPY;  Service: Cardiovascular;  Laterality: N/A;  . CARDIOVERSION  04/19/2012   Procedure: CARDIOVERSION;  Surgeon: Sinclair Grooms, MD;  Location: [place];  Service: Cardiovascular;  Laterality: N/A;  . COLONOSCOPY N/A 04/11/2013   Procedure: COLONOSCOPY;  Surgeon: Inda Castle, MD;  Location: [facility] ENDOSCOPY;  Service: Endoscopy;  Laterality: N/A;  . COLONOSCOPY N/A 04/11/2013   Procedure: COLONOSCOPY;  Surgeon: Inda Castle, MD;  Location: [facility] ENDOSCOPY;  Service: Endoscopy;  Laterality: N/A;  . EP IMPLANTABLE DEVICE N/A 02/17/2016   Procedure: BiV ICD Insertion CRT-D;  Surgeon: Evans Lance, MD;  Location: [place] CV LAB;  Service: Cardiovascular;  Laterality: N/A;  . FINGER SURGERY  2012   "4th digit right hand; thumb on left hand"  . RADIOLOGY WITH ANESTHESIA N/A 02/11/2016   Procedure: MRI OF THE BRAIN WITHOUT CONTRAST, LUMBAR WITHOUT CONTRAST;  Surgeon: Medication Radiologist, MD;  Location: [place];  Service: Radiology;  Laterality: N/A;  DR. WOOD/MRI  . Skin melanocytoma excision  2012   "above left clavicle"  . TEE WITHOUT CARDIOVERSION  03/22/2012   Procedure: TRANSESOPHAGEAL ECHOCARDIOGRAM (TEE);  Surgeon: Candee Furbish, MD;  Location: [facility] ENDOSCOPY;  Service: Cardiovascular;  Laterality: N/A;  . TEE WITHOUT CARDIOVERSION N/A 02/08/2016   Procedure: TRANSESOPHAGEAL ECHOCARDIOGRAM (TEE);  Surgeon: Lelon Perla, MD;  Location: [facility] ENDOSCOPY;  Service: Cardiovascular;  Laterality: N/A;    There were no vitals filed for this visit.      Subjective Assessment - 07/12/16 1240    Subjective Stiff this AM   Currently in Pain? No/denies   Pain Location Knee   Pain Descriptors / Indicators Tightness   Multiple Pain Sites No                         [facility] Adult PT Treatment/Exercise - 07/12/16 0001      Knee/Hip Exercises: Aerobic   Nustep L3 x 10 min     Knee/Hip Exercises: Machines for Strengthening   Total Gym Leg Press Seat 9: Bil  80# 3x10, RTLE 40# 10x3     Knee/Hip Exercises: Seated   Hamstring Curl --  Stool scoots 40 feet 4x   Sit to Sand 2 sets;10 reps;without UE support  On the stool with no hand rails                  PT Short Term Goals - 07/10/16 1616      PT SHORT TERM GOAL #3   Title right knee extension AROM in sitting -30 degress due to increase strength   Time 4   Period Weeks   Status On-going     PT SHORT TERM GOAL #4   Title pain in right leg decreased >/= 25%    Time 4   Period Weeks   Status Achieved     PT SHORT TERM GOAL #5   Title able to get in and out of car without helping his right leg due to increased strength    Time 4   Period Weeks   Status Achieved           PT Long Term Goals - 07/10/16 1617      PT LONG TERM GOAL #1   Title independent with HEP and understand how to progress himself   Time 8   Period Weeks   Status On-going     PT LONG TERM GOAL #2   Title able to stand for 25 min with pain decreased >/= 50% so he is able to cook a meal.   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   Time 8   Period Weeks   Status On-going     PT LONG TERM GOAL #5   Title return to household chores due to lower extremity strength >/= 4/5   Time 8   Period Weeks   Status On-going     PT LONG TERM GOAL #6   Title return to driving due to increased right lower extremity strength >/= 4/5   Time 8   Period Weeks   Status On-going     PT LONG TERM GOAL #7   Title pain decreased >/= 50% in right knee   Time 8   Period Weeks   Status On-going               Plan - 07/12/16 1305    Clinical Impression Statement Worked on increasing independpence and safelt getting on/off 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 Pt basically needed a little support at one wheel of the stool so it did not push away from him, but other than that pt could perform the sit to stand independently. Increased weight of the leg press today, pt was monitored thoughout the session including some walking without the cane from one exercises to the other.    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   PT Frequency 3x / week   PT Duration Other (comment)   PT Treatment/Interventions ADLs/Self Care Home Management;Cryotherapy;Electrical Stimulation;Functional mobility training;Stair  training;Gait training;Ultrasound;Moist Heat;Therapeutic activities;Therapeutic exercise;Balance training;Neuromuscular re-education;Patient/family education;Passive range of motion;Manual techniques;Energy  conservation;Other (comment)   PT Next Visit Plan Continue knee strengthening, AAROM in open chain, sit to stand off the stool, gait without device.    Consulted and Agree with Plan of Care Patient      Patient will benefit from skilled therapeutic intervention in order to improve the following deficits and impairments:  Abnormal gait, Difficulty walking, Decreased endurance, Pain, Decreased activity tolerance, Decreased balance, Decreased strength, Decreased mobility  Visit Diagnosis: Right knee pain, unspecified chronicity  Muscle weakness (generalized)  Other abnormalities of gait and mobility     Problem List Patient Active Problem List   Diagnosis Date Noted  . Right ankle pain   . Pain   . Tibial pain   . Acute idiopathic gout of right ankle   . Right knee pain   . Diabetes mellitus type 2 in obese ([place])   . Labile blood pressure   . Acute blood loss anemia   . Diabetes mellitus type 2 in nonobese ([facility])   . Debility 02/22/2016  . 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 Weakness of both lower extremities   . Lower extremity weakness   . Pneumonia   . Acute lumbar back pain   . Non-traumatic rhabdomyolysis   . Retroperitoneal bleed   . Back pain   . Leg weakness, bilateral   . Paroxysmal atrial fibrillation ([place])   . Ventricular fibrillation ([place]) 02/09/2016  . Aortic aneurysm without rupture ([place]) 02/09/2016  . Aortic insufficiency 02/09/2016  . HCAP (healthcare-associated pneumonia)   . Cardiac arrest ([place]) 02/01/2016  . Acute on chronic systolic heart failure ([place])   . Acute encephalopathy   . Thyroid activity decreased   . Acute hypoxemic respiratory failure ([place])   . Acute respiratory failure ([place])   . Encounter for central line  placement   . Arrhythmia   . Altered mental status   . CKD (chronic kidney disease), stage IV ([place]) 08/06/2013    Class: Chronic  . Long term (current) use of anticoagulants 08/06/2013    Class: Chronic  . Special screening for malignant neoplasms, colon 04/11/2013  . Colon cancer screening 03/04/2013  . Chronic anticoagulation 03/04/2013  . Atrial flutter ([place]) 03/18/2012    Class: Acute  . Chronic systolic heart failure ([place]) 02/13/2012    Class: Acute  . Hypertension, accelerated 02/13/2012  . Aortic valve regurgitation, acquired 02/13/2012  . Aortic root enlargement ([place]) 02/13/2012  . Hyperlipidemia 02/13/2012  . Left bundle branch block 02/13/2012  . Obesity (BMI 30-39.9) 02/13/2012    Eric [name], PTA 07/12/2016, 1:11 PM  [facility] [address], [place], [place], [address] Phone: [phone]   Fax:  [phone]  Name: Eric Rueb  MD MRN: [id number] Date of Birth: 29-Dec-1950

## 2016-07-17 ENCOUNTER — Ambulatory Visit: Payer: BC Managed Care – PPO | Admitting: Physical Therapy

## 2016-07-17 ENCOUNTER — Encounter: Payer: Self-pay | Admitting: Physical Therapy

## 2016-07-17 DIAGNOSIS — M25561 Pain in right knee: Secondary | ICD-10-CM

## 2016-07-17 DIAGNOSIS — R2689 Other abnormalities of gait and mobility: Secondary | ICD-10-CM

## 2016-07-17 DIAGNOSIS — M545 Low back pain, unspecified: Secondary | ICD-10-CM

## 2016-07-17 DIAGNOSIS — M6281 Muscle weakness (generalized): Secondary | ICD-10-CM

## 2016-07-17 DIAGNOSIS — G8929 Other chronic pain: Secondary | ICD-10-CM

## 2016-07-17 NOTE — Therapy (Signed)
[facility] [address] [place], [place], [address] Phone: [phone]   Fax:  [phone]  Physical Therapy Treatment  Patient Details  Name: Eric Hudgins  MD MRN: [id number] Date of Birth: 03-24-1951 Referring Provider: Dr. Roe Coombs  Encounter Date: 07/17/2016      PT End of Session - 07/17/16 1539    Visit Number 26   Date for PT Re-Evaluation 08/26/16   Authorization Type BCBS   PT Start Time 1530   PT Stop Time 1613   PT Time Calculation (min) 43 min   Activity Tolerance Patient tolerated treatment well   Behavior During Therapy [facility] for tasks assessed/performed      Past Medical History:  Diagnosis Date  . CHF (congestive heart failure) ([place])   . Chronic kidney disease    kidney fx studies increased   . Chronic lower back pain   . Claustrophobia   . Dysrhythmia    "palpitations"  . Exertional dyspnea 01/2012  . Heart murmur   . Hypertension   . Hypothyroidism   . Migraine 02/13/12   "opthalmic"  . Varicose vein of leg    right    Past Surgical History:  Procedure Laterality Date  . CARDIAC CATHETERIZATION N/A 02/17/2016   Procedure: Left Heart Cath and Coronary Angiography;  Surgeon: Jettie Booze, MD;  Location: [place] [facility];  Service: Cardiovascular;  Laterality: N/A;  . CARDIOVERSION  03/22/2012   Procedure: CARDIOVERSION;  Surgeon: Candee Furbish, MD;  Location: [facility] END[facility];  Service: Cardiovascular;  Laterality: N/A;  . CARDIOVERSION  04/19/2012   Procedure: CARDIOVERSION;  Surgeon: Sinclair Grooms, MD;  Location: [place];  Service: Cardiovascular;  Laterality: N/A;  . COLONOSCOPY N/A 04/11/2013   Procedure: COLONOSCOPY;  Surgeon: Inda Castle, MD;  Location: [facility]OSCOPY;  Service: Endoscopy;  Laterality: N/A;  . COLONOSCOPY N/A 04/11/2013   Procedure: COLONOSCOPY;  Surgeon: Inda Castle, MD;  Location: [facility] ENDOSCOPY;  Service: Endoscopy;  Laterality: N/A;  . EP  IMPLANTABLE DEVICE N/A 02/17/2016   Procedure: BiV ICD Insertion CRT-D;  Surgeon: Evans Lance, MD;  Location: [place] [facility];  Service: Cardiovascular;  Laterality: N/A;  . FINGER SURGERY  2012   "4th digit right hand; thumb on left hand"  . RADIOLOGY WITH ANESTHESIA N/A 02/11/2016   Procedure: MRI OF THE BRAIN WITHOUT CONTRAST, LUMBAR WITHOUT CONTRAST;  Surgeon: Medication Radiologist, MD;  Location: [place];  Service: Radiology;  Laterality: N/A;  DR. WOOD/MRI  . Skin melanocytoma excision  2012   "above left clavicle"  . TEE WITHOUT CARDIOVERSION  03/22/2012   Procedure: TRANSESOPHAGEAL ECHOCARDIOGRAM (TEE);  Surgeon: Candee Furbish, MD;  Location: [facility] ENDOSCOPY;  Service: Cardiovascular;  Laterality: N/A;  . TEE WITHOUT CARDIOVERSION N/A 02/08/2016   Procedure: TRANSESOPHAGEAL ECHOCARDIOGRAM (TEE);  Surgeon: Lelon Perla, MD;  Location: [facility] ENDOSCOPY;  Service: Cardiovascular;  Laterality: N/A;    There were no vitals filed for this visit.      Subjective Assessment - 07/17/16 1536    Subjective Pt reports knee stiffness today. Last Friday Rt knee bukled while coming down steps. Pt fell on last step but only knee hit the floor. Felt some pain but not too bad.    Patient is accompained by: Family member   How long can you sit comfortably? sitting for 45 min and right leg gets numb   How long can you stand comfortably? without walker 60 sec.    How  long can you walk comfortably? walks with rolling waker and pressure in right knee   Patient Stated Goals regain previous mobility.  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 Yes   Pain Score 5    Pain Location Knee   Pain Orientation Right   Pain Descriptors / Indicators Tightness   Pain Type Chronic pain   Pain Onset More than a month ago                         [facility] Adult PT Treatment/Exercise - 07/17/16 0001      Knee/Hip Exercises:  Stretches   Other Knee/Hip Stretches Knee flexion stretch  5 times 10 second holds     Knee/Hip Exercises: Aerobic   Nustep L3 x 10 min     Knee/Hip Exercises: Machines for Strengthening   Total Gym Leg Press Seat 9: Bil 80# 3x10, RTLE 40# 10x3     Knee/Hip Exercises: Standing   Other Standing Knee Exercises Walking without cane  SBA from therapits for safety     Knee/Hip Exercises: Seated   Hamstring Curl --  Stool scoots 40 feet 4x   Sit to Sand 2 sets;10 reps;without UE support  On the stool with no hand rails     Knee/Hip Exercises: Supine   Short Arc Quad Sets AAROM;3 sets;10 reps                  PT Short Term Goals - 07/10/16 1616      PT SHORT TERM GOAL #3   Title right knee extension AROM in sitting -30 degress due to increase strength   Time 4   Period Weeks   Status On-going     PT SHORT TERM GOAL #4   Title pain in right leg decreased >/= 25%    Time 4   Period Weeks   Status Achieved     PT SHORT TERM GOAL #5   Title able to get in and out of car without helping his right leg due to increased strength   Time 4   Period Weeks   Status Achieved           PT Long Term Goals - 07/10/16 1617      PT LONG TERM GOAL #1   Title independent with HEP and understand how to progress himself   Time 8   Period Weeks   Status On-going     PT LONG TERM GOAL #2   Title able to stand for 25 min with pain decreased >/= 50% so he is able to cook a meal.   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   Time 8   Period Weeks   Status On-going     PT LONG TERM GOAL #5   Title return to household chores due to lower extremity strength >/= 4/5   Time 8   Period Weeks   Status On-going     PT LONG TERM GOAL #6   Title return to  driving due to  increased right lower extremity strength >/= 4/5   Time 8   Period Weeks   Status On-going     PT LONG TERM GOAL #7   Title pain decreased >/= 50% in right knee   Time 8   Period Weeks   Status On-going               Plan - 07/17/16 1635    Clinical Impression Statement Pt practiced walking without assitive device, needing verbal cues for symetry. Pt practiced job related tasks such as sitting on and standing up from rolling stool and scooting around on stool. Pt will continue to benefit from skilled therapy for knee strength and stability to return safely to previous level of function.    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   PT Frequency 3x / week   PT Duration Other (comment)   PT Treatment/Interventions ADLs/Self Care Home Management;Cryotherapy;Electrical Stimulation;Functional mobility training;Stair training;Gait training;Ultrasound;Moist Heat;Therapeutic activities;Therapeutic exercise;Balance training;Neuromuscular re-education;Patient/family education;Passive range of motion;Manual techniques;Energy conservation;Other (comment)   PT Next Visit Plan Continue knee strengthening, AAROM in open chain, sit to stand off the stool, gait without device.    Consulted and Agree with Plan of Care Patient      Patient will benefit from skilled therapeutic intervention in order to improve the following deficits and impairments:  Abnormal gait, Difficulty walking, Decreased endurance, Pain, Decreased activity tolerance, Decreased balance, Decreased strength, Decreased mobility  Visit Diagnosis: Right knee pain, unspecified chronicity  Muscle weakness (generalized)  Other abnormalities of gait and mobility  Left-sided low back pain without sciatica, unspecified chronicity  Chronic pain of right knee     Problem List Patient Active Problem List   Diagnosis  Date Noted  . Right ankle pain   . Pain   . Tibial pain   . Acute idiopathic gout of right ankle   . Right knee pain   . Diabetes mellitus type 2 in obese ([place])   . Labile blood pressure   . Acute blood loss anemia   . Diabetes mellitus type 2 in nonobese ([facility])   . Debility 02/22/2016  . 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 Weakness of both lower extremities   . Lower extremity weakness   . Pneumonia   . Acute lumbar back pain   . Non-traumatic rhabdomyolysis   . Retroperitoneal bleed   . Back pain   . Leg weakness, bilateral   . Paroxysmal atrial fibrillation ([place])   . Ventricular fibrillation ([place]) 02/09/2016  . Aortic aneurysm without rupture ([place]) 02/09/2016  . Aortic insufficiency 02/09/2016  . HCAP (healthcare-associated pneumonia)   . Cardiac arrest ([place]) 02/01/2016  . Acute on chronic systolic heart failure ([place])   . Acute encephalopathy   . Thyroid activity decreased   . Acute hypoxemic respiratory failure ([place])   . Acute respiratory failure ([place])   . Encounter for central line placement   . Arrhythmia   . Altered mental status   . CKD (chronic kidney disease), stage IV ([place]) 08/06/2013    Class: Chronic  . Long term (current) use of anticoagulants 08/06/2013    Class: Chronic  . Special screening for malignant neoplasms, colon 04/11/2013  . Colon cancer screening 03/04/2013  . Chronic anticoagulation 03/04/2013  . Atrial flutter ([place]) 03/18/2012    Class: Acute  . Chronic systolic heart failure ([place]) 02/13/2012    Class: Acute  . Hypertension, accelerated 02/13/2012  .  Aortic valve regurgitation, acquired 02/13/2012  . Aortic root enlargement ([place]) 02/13/2012  . Hyperlipidemia 02/13/2012  . Left bundle branch block 02/13/2012  . Obesity (BMI 30-39.9) 02/13/2012    Mikle Bosworth PTA 07/17/2016, 5:20 PM  [facility] [address] [place], [place], [address] Phone:  [phone]   Fax:  [phone]  Name: Idan Prime  MD MRN: [id number] Date of Birth: 09-12-1951

## 2016-07-19 ENCOUNTER — Encounter: Payer: Self-pay | Admitting: Physical Therapy

## 2016-07-19 ENCOUNTER — Ambulatory Visit: Payer: BC Managed Care – PPO | Admitting: Physical Therapy

## 2016-07-19 DIAGNOSIS — M25561 Pain in right knee: Secondary | ICD-10-CM

## 2016-07-19 DIAGNOSIS — G8929 Other chronic pain: Secondary | ICD-10-CM

## 2016-07-19 DIAGNOSIS — M6281 Muscle weakness (generalized): Secondary | ICD-10-CM

## 2016-07-19 DIAGNOSIS — R2689 Other abnormalities of gait and mobility: Secondary | ICD-10-CM

## 2016-07-19 NOTE — Therapy (Signed)
[facility] [address], [place], [place], [address] Phone: [phone]   Fax:  [phone]  Physical Therapy Treatment  Patient Details  Name: Eric Willis  MD MRN: [id number] Date of Birth: 29-Dec-1950 Referring Provider: Dr. Roe Coombs  Encounter Date: 07/19/2016      PT End of Session - 07/19/16 1453    Visit Number 27   Date for PT Re-Evaluation 08/26/16   Authorization Type BCBS   PT Start Time 1451   PT Stop Time 1530   PT Time Calculation (min) 39 min   Activity Tolerance Patient tolerated treatment well   Behavior During Therapy [facility] for tasks assessed/performed      Past Medical History:  Diagnosis Date  . CHF (congestive heart failure) ([place])   . Chronic kidney disease    kidney fx studies increased   . Chronic lower back pain   . Claustrophobia   . Dysrhythmia    "palpitations"  . Exertional dyspnea 01/2012  . Heart murmur   . Hypertension   . Hypothyroidism   . Migraine 02/13/12   "opthalmic"  . Varicose vein of leg    right    Past Surgical History:  Procedure Laterality Date  . CARDIAC CATHETERIZATION N/A 02/17/2016   Procedure: Left Heart Cath and Coronary Angiography;  Surgeon: Jettie Booze, MD;  Location: [place] [facility];  Service: Cardiovascular;  Laterality: N/A;  . CARDIOVERSION  03/22/2012   Procedure: CARDIOVERSION;  Surgeon: Candee Furbish, MD;  Location: [facility] ENDOSCOPY;  Service: Cardiovascular;  Laterality: N/A;  . CARDIOVERSION  04/19/2012   Procedure: CARDIOVERSION;  Surgeon: Sinclair Grooms, MD;  Location: [place];  Service: Cardiovascular;  Laterality: N/A;  . COLONOSCOPY N/A 04/11/2013   Procedure: COLONOSCOPY;  Surgeon: Inda Castle, MD;  Location: [facility];  Service: Endoscopy;  Laterality: N/A;  . COLONOSCOPY N/A 04/11/2013   Procedure: COLONOSCOPY;  Surgeon: Inda Castle, MD;  Location: [facility];  Service: Endoscopy;  Laterality: N/A;  . EP  IMPLANTABLE DEVICE N/A 02/17/2016   Procedure: BiV ICD Insertion CRT-D;  Surgeon: Evans Lance, MD;  Location: [place] [facility];  Service: Cardiovascular;  Laterality: N/A;  . FINGER SURGERY  2012   "4th digit right hand; thumb on left hand"  . RADIOLOGY WITH ANESTHESIA N/A 02/11/2016   Procedure: MRI OF THE BRAIN WITHOUT CONTRAST, LUMBAR WITHOUT CONTRAST;  Surgeon: Medication Radiologist, MD;  Location: [place];  Service: Radiology;  Laterality: N/A;  DR. WOOD/MRI  . Skin melanocytoma excision  2012   "above left clavicle"  . TEE WITHOUT CARDIOVERSION  03/22/2012   Procedure: TRANSESOPHAGEAL ECHOCARDIOGRAM (TEE);  Surgeon: Candee Furbish, MD;  Location: [facility] ENDOSCOPY;  Service: Cardiovascular;  Laterality: N/A;  . TEE WITHOUT CARDIOVERSION N/A 02/08/2016   Procedure: TRANSESOPHAGEAL ECHOCARDIOGRAM (TEE);  Surgeon: Lelon Perla, MD;  Location: [facility] ENDOSCOPY;  Service: Cardiovascular;  Laterality: N/A;    There were no vitals filed for this visit.      Subjective Assessment - 07/19/16 1452    Subjective Rt knee stiffness with some pain   Patient is accompained by: Family member   Currently in Pain? Yes   Pain Score 6    Pain Location Knee   Pain Orientation Right   Pain Descriptors / Indicators Tightness   Pain Type Chronic pain                         [facility]  Adult PT Treatment/Exercise - 07/19/16 0001      Knee/Hip Exercises: Aerobic   Stationary Bike L1 x 10 minutes  Therapist present to discuss treatment     Knee/Hip Exercises: Machines for Strengthening   Total Gym Leg Press Seat 9 Bil #90 2x10 Rt LE #45 2x10     Knee/Hip Exercises: Standing   SLS Standing on blue foam  2x 1 minute   Rebounder 3 direction weight shift, Marching  1 minute each   Other Standing Knee Exercises walking front and backward no AD  SBA from therapits for safety   Other Standing Knee Exercises --                  PT Short Term Goals - 07/10/16 1616      PT SHORT  TERM GOAL #3   Title right knee extension AROM in sitting -30 degress due to increase strength   Time 4   Period Weeks   Status On-going     PT SHORT TERM GOAL #4   Title pain in right leg decreased >/= 25%    Time 4   Period Weeks   Status Achieved     PT SHORT TERM GOAL #5   Title able to get in and out of car without helping his right leg due to increased strength   Time 4   Period Weeks   Status Achieved           PT Long Term Goals - 07/10/16 1617      PT LONG TERM GOAL #1   Title independent with HEP and understand how to progress himself   Time 8   Period Weeks   Status On-going     PT LONG TERM GOAL #2   Title able to stand for 25 min with pain decreased >/= 50% so he is able to cook a meal.   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   Time 8   Period Weeks   Status On-going     PT LONG TERM GOAL #5   Title return to household chores due to lower extremity strength >/= 4/5   Time 8   Period Weeks   Status On-going     PT LONG TERM GOAL #6   Title return to driving due to increased right lower extremity strength >/= 4/5   Time 8   Period Weeks   Status On-going     PT LONG TERM GOAL #7   Title pain decreased >/= 50% in right knee   Time 8   Period Weeks   Status On-going               Plan - 07/19/16 1530    Clinical Impression Statement Pt continues to have some pain and stiffness in Rt knee. Able to tolerate all exercises today challenging proprioception and balance. Pt will continue to benefit from skilled therapy for knee strength and stability.    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   PT Frequency 3x / week    PT Duration Other (comment)  PT Treatment/Interventions ADLs/Self Care Home Management;Cryotherapy;Electrical Stimulation;Functional mobility training;Stair training;Gait training;Ultrasound;Moist Heat;Therapeutic activities;Therapeutic exercise;Balance training;Neuromuscular re-education;Patient/family education;Passive range of motion;Manual techniques;Energy conservation;Other (comment)   PT Next Visit Plan Balance and gait training, try multi direction walking   Consulted and Agree with Plan of Care Patient      Patient will benefit from skilled therapeutic intervention in order to improve the following deficits and impairments:  Abnormal gait, Difficulty walking, Decreased endurance, Pain, Decreased activity tolerance, Decreased balance, Decreased strength, Decreased mobility  Visit Diagnosis: Muscle weakness (generalized)  Right knee pain, unspecified chronicity  Other abnormalities of gait and mobility  Chronic pain of right knee     Problem List Patient Active Problem List   Diagnosis Date Noted  . Right ankle pain   . Pain   . Tibial pain   . Acute idiopathic gout of right ankle   . Right knee pain   . Diabetes mellitus type 2 in obese ([place])   . Labile blood pressure   . Acute blood loss anemia   . Diabetes mellitus type 2 in nonobese ([facility])   . Debility 02/22/2016  . 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 Weakness of both lower extremities   . Lower extremity weakness   . Pneumonia   . Acute lumbar back pain   . Non-traumatic rhabdomyolysis   . Retroperitoneal bleed   . Back pain   . Leg weakness, bilateral   . Paroxysmal atrial fibrillation ([place])   . Ventricular fibrillation ([place]) 02/09/2016  . Aortic aneurysm without rupture ([place]) 02/09/2016  . Aortic insufficiency 02/09/2016  . HCAP (healthcare-associated pneumonia)   . Cardiac arrest ([place]) 02/01/2016  . Acute on chronic systolic heart failure ([place])   . Acute encephalopathy   .  Thyroid activity decreased   . Acute hypoxemic respiratory failure ([place])   . Acute respiratory failure ([place])   . Encounter for central line placement   . Arrhythmia   . Altered mental status   . CKD (chronic kidney disease), stage IV ([place]) 08/06/2013    Class: Chronic  . Long term (current) use of anticoagulants 08/06/2013    Class: Chronic  . Special screening for malignant neoplasms, colon 04/11/2013  . Colon cancer screening 03/04/2013  . Chronic anticoagulation 03/04/2013  . Atrial flutter ([place]) 03/18/2012    Class: Acute  . Chronic systolic heart failure ([place]) 02/13/2012    Class: Acute  . Hypertension, accelerated 02/13/2012  . Aortic valve regurgitation, acquired 02/13/2012  . Aortic root enlargement ([place]) 02/13/2012  . Hyperlipidemia 02/13/2012  . Left bundle branch block 02/13/2012  . Obesity (BMI 30-39.9) 02/13/2012    Mikle Bosworth PTA 07/19/2016, 3:33 PM  [facility] [address] [place], [place], [address] Phone: [phone]   Fax:  [phone]  Name: Eric Cina  MD MRN: [id number] Date of Birth: 18-Oct-1950

## 2016-07-20 ENCOUNTER — Telehealth: Payer: Self-pay | Admitting: *Deleted

## 2016-07-20 ENCOUNTER — Encounter: Payer: Self-pay | Admitting: *Deleted

## 2016-07-20 NOTE — Telephone Encounter (Signed)
Letter faxed to [facility].

## 2016-07-20 NOTE — Telephone Encounter (Signed)
Eric Crome, MD  Loren Racer, LPN        Fax for Dr. Marylynn Pearson [phone]  Fax for [facility] [phone], [place] attention [place].

## 2016-07-20 NOTE — Telephone Encounter (Signed)
Letter written.  Awaiting pt to contact office with fax number.

## 2016-07-20 NOTE — Telephone Encounter (Signed)
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 The following letter will need to be sent/faxed to him and [facility]. 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 Please contact me if there are concerns.  Sincerely,   Illene Labrador, III, MD

## 2016-07-24 ENCOUNTER — Ambulatory Visit: Payer: BC Managed Care – PPO | Admitting: Physical Therapy

## 2016-07-24 ENCOUNTER — Encounter: Payer: Self-pay | Admitting: Physical Therapy

## 2016-07-24 DIAGNOSIS — M6281 Muscle weakness (generalized): Secondary | ICD-10-CM

## 2016-07-24 DIAGNOSIS — M25561 Pain in right knee: Secondary | ICD-10-CM | POA: Diagnosis not present

## 2016-07-24 NOTE — Therapy (Signed)
[facility] [address] [place], [place], [address] Phone: [phone]   Fax:  [phone]  Physical Therapy Treatment  Patient Details  Name: Eric Rosemond  MD MRN: [id number] Date of Birth: 1950/12/28 Referring Provider: Dr. Roe Coombs  Encounter Date: 07/24/2016      PT End of Session - 07/24/16 1454    Visit Number 28   Date for PT Re-Evaluation 08/26/16   Authorization Type BCBS   PT Start Time 1449   PT Stop Time 1530   PT Time Calculation (min) 41 min   Activity Tolerance Patient tolerated treatment well   Behavior During Therapy [facility] for tasks assessed/performed      Past Medical History:  Diagnosis Date  . CHF (congestive heart failure) ([place])   . Chronic kidney disease    kidney fx studies increased   . Chronic lower back pain   . Claustrophobia   . Dysrhythmia    "palpitations"  . Exertional dyspnea 01/2012  . Heart murmur   . Hypertension   . Hypothyroidism   . Migraine 02/13/12   "opthalmic"  . Varicose vein of leg    right    Past Surgical History:  Procedure Laterality Date  . CARDIAC CATHETERIZATION N/A 02/17/2016   Procedure: Left Heart Cath and Coronary Angiography;  Surgeon: Jettie Booze, MD;  Location: [place] [facility];  Service: Cardiovascular;  Laterality: N/A;  . CARDIOVERSION  03/22/2012   Procedure: CARDIOVERSION;  Surgeon: Candee Furbish, MD;  Location: [facility] ENDOSCOPY;  Service: Cardiovascular;  Laterality: N/A;  . CARDIOVERSION  04/19/2012   Procedure: CARDIOVERSION;  Surgeon: Sinclair Grooms, MD;  Location: [place];  Service: Cardiovascular;  Laterality: N/A;  . COLONOSCOPY N/A 04/11/2013   Procedure: COLONOSCOPY;  Surgeon: Inda Castle, MD;  Location: [facility] ENDOSCOPY;  Service: Endoscopy;  Laterality: N/A;  . COLONOSCOPY N/A 04/11/2013   Procedure: COLONOSCOPY;  Surgeon: Inda Castle, MD;  Location: [facility] ENDOSCOPY;  Service: Endoscopy;  Laterality: N/A;  . EP  IMPLANTABLE DEVICE N/A 02/17/2016   Procedure: BiV ICD Insertion CRT-D;  Surgeon: Evans Lance, MD;  Location: [place] [facility];  Service: Cardiovascular;  Laterality: N/A;  . FINGER SURGERY  2012   "4th digit right hand; thumb on left hand"  . RADIOLOGY WITH ANESTHESIA N/A 02/11/2016   Procedure: MRI OF THE BRAIN WITHOUT CONTRAST, LUMBAR WITHOUT CONTRAST;  Surgeon: Medication Radiologist, MD;  Location: [place];  Service: Radiology;  Laterality: N/A;  DR. WOOD/MRI  . Skin melanocytoma excision  2012   "above left clavicle"  . TEE WITHOUT CARDIOVERSION  03/22/2012   Procedure: TRANSESOPHAGEAL ECHOCARDIOGRAM (TEE);  Surgeon: Candee Furbish, MD;  Location: [facility] ENDOSCOPY;  Service: Cardiovascular;  Laterality: N/A;  . TEE WITHOUT CARDIOVERSION N/A 02/08/2016   Procedure: TRANSESOPHAGEAL ECHOCARDIOGRAM (TEE);  Surgeon: Lelon Perla, MD;  Location: [facility] ENDOSCOPY;  Service: Cardiovascular;  Laterality: N/A;    There were no vitals filed for this visit.      Subjective Assessment - 07/24/16 1454    Subjective Work is going well, AM knee pain. Has not done any exercise since last time here.    Currently in Pain? No/denies   Multiple Pain Sites No                         [facility] Adult PT Treatment/Exercise - 07/24/16 0001      Knee/Hip Exercises: Aerobic   Stationary Bike LL1-3 x  10 min     Knee/Hip Exercises: Machines for Strengthening   Total Gym Leg Press Seat 7; Bil LE 90# 3x10, RTLE 50# 3x10     Knee/Hip Exercises: Standing   Walking with Sports Cord 4 directions 5x with 20#  Close SBA, needed sitting rest break after first 2 direction                  PT Short Term Goals - 07/10/16 1616      PT SHORT TERM GOAL #3   Title right knee extension AROM in sitting -30 degress due to increase strength   Time 4   Period Weeks   Status On-going     PT SHORT TERM GOAL #4   Title pain in right leg decreased >/= 25%    Time 4   Period Weeks   Status Achieved      PT SHORT TERM GOAL #5   Title able to get in and out of car without helping his right leg due to increased strength   Time 4   Period Weeks   Status Achieved           PT Long Term Goals - 07/24/16 1520      PT LONG TERM GOAL #2   Title able to stand for 25 min with pain decreased >/= 50% so he is able to cook a meal.   Time 8   Period Weeks   Status Achieved     PT LONG TERM GOAL #3   Title going up and down stairs with step to step with a cane   Time 8   Period Weeks   Status Achieved     PT LONG TERM GOAL #4   Title sit on stool and scoot around office to assess patients   Time 8   Period Weeks   Status Achieved               Plan - 07/24/16 1455    Clinical Impression Statement Pt met quite a few long term goals today as he can stand in the kitchen to cook with much less pain and at least 25 min, is seeing pt's getting on/off stool no problem, and can negotiate stais with his cane or rail step to step. We added resisted walking to program today which was most challenging in the side stepping position to the LT.  Pt still cannot actively extend the RT knee against gravity.    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   PT Frequency 3x / week   PT Duration Other (comment)   PT Treatment/Interventions ADLs/Self Care Home Management;Cryotherapy;Electrical Stimulation;Functional mobility training;Stair training;Gait training;Ultrasound;Moist Heat;Therapeutic activities;Therapeutic exercise;Balance training;Neuromuscular re-education;Patient/family education;Passive range of motion;Manual techniques;Energy conservation;Other (comment)   PT Next Visit Plan Continue with resisted walking, leg press   Consulted and Agree with Plan of Care Patient      Patient will benefit from skilled therapeutic intervention in order to improve the following deficits and impairments:   Abnormal gait, Difficulty walking, Decreased endurance, Pain, Decreased activity tolerance, Decreased balance, Decreased strength, Decreased mobility  Visit Diagnosis: Muscle weakness (generalized)     Problem List Patient Active Problem List   Diagnosis Date Noted  . Right ankle pain   . Pain   . Tibial pain   . Acute idiopathic gout of right ankle   . Right knee pain   . Diabetes mellitus type 2 in obese (  [place] [employer])   . Labile blood pressure   . Acute blood loss anemia   . Diabetes mellitus type 2 in nonobese ([facility])   . Debility 02/22/2016  . 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 Weakness of both lower extremities   . Lower extremity weakness   . Pneumonia   . Acute lumbar back pain   . Non-traumatic rhabdomyolysis   . Retroperitoneal bleed   . Back pain   . Leg weakness, bilateral   . Paroxysmal atrial fibrillation ([place])   . Ventricular fibrillation ([place]) 02/09/2016  . Aortic aneurysm without rupture ([place]) 02/09/2016  . Aortic insufficiency 02/09/2016  . HCAP (healthcare-associated pneumonia)   . Cardiac arrest ([place]) 02/01/2016  . Acute on chronic systolic heart failure ([place])   . Acute encephalopathy   . Thyroid activity decreased   . Acute hypoxemic respiratory failure ([place])   . Acute respiratory failure ([place])   . Encounter for central line placement   . Arrhythmia   . Altered mental status   . CKD (chronic kidney disease), stage IV ([facility]) 08/06/2013    Class: Chronic  . Long term (current) use of anticoagulants 08/06/2013    Class: Chronic  . Special screening for malignant neoplasms, colon 04/11/2013  . Colon cancer screening 03/04/2013  . Chronic anticoagulation 03/04/2013  . Atrial flutter ([place]) 03/18/2012    Class: Acute  . Chronic systolic heart failure ([place]) 02/13/2012    Class: Acute  . Hypertension, accelerated 02/13/2012  . Aortic valve regurgitation, acquired 02/13/2012  . Aortic root enlargement ([place]) 02/13/2012  . Hyperlipidemia  02/13/2012  . Left bundle branch block 02/13/2012  . Obesity (BMI 30-39.9) 02/13/2012    Valta Dillon, PTA 07/24/2016, 3:34 PM  [facility] [address] [place], [place], [address] Phone: [phone]   Fax:  [phone]  Name: Eric Renne  MD MRN: [id number] Date of Birth: [birth year]

## 2016-07-25 ENCOUNTER — Encounter: Payer: Self-pay | Admitting: Interventional Cardiology

## 2016-07-26 ENCOUNTER — Encounter: Payer: Self-pay | Admitting: Physical Therapy

## 2016-07-26 ENCOUNTER — Ambulatory Visit: Payer: BC Managed Care – PPO | Attending: Internal Medicine | Admitting: Physical Therapy

## 2016-07-26 DIAGNOSIS — M25561 Pain in right knee: Secondary | ICD-10-CM | POA: Insufficient documentation

## 2016-07-26 DIAGNOSIS — M6281 Muscle weakness (generalized): Secondary | ICD-10-CM

## 2016-07-26 DIAGNOSIS — R2689 Other abnormalities of gait and mobility: Secondary | ICD-10-CM | POA: Diagnosis present

## 2016-07-26 NOTE — Therapy (Signed)
[facility] [address], [place], [place], [address] Phone: [phone]   Fax:  [phone]  Physical Therapy Treatment  Patient Details  Name: Eric Burich  MD MRN: [id number] Date of Birth: Nov 24, 1950 Referring Provider: Dr. Roe Coombs  Encounter Date: 07/26/2016      PT End of Session - 07/26/16 1532    Visit Number 29   Date for PT Re-Evaluation 08/26/16   Authorization Type BCBS   PT Start Time 1529   PT Stop Time 1611   PT Time Calculation (min) 42 min   Activity Tolerance Patient tolerated treatment well   Behavior During Therapy [facility] for tasks assessed/performed      Past Medical History:  Diagnosis Date  . CHF (congestive heart failure) ([place])   . Chronic kidney disease    kidney fx studies increased   . Chronic lower back pain   . Claustrophobia   . Dysrhythmia    "palpitations"  . Exertional dyspnea 01/2012  . Heart murmur   . Hypertension   . Hypothyroidism   . Migraine 02/13/12   "opthalmic"  . Varicose vein of leg    right    Past Surgical History:  Procedure Laterality Date  . CARDIAC CATHETERIZATION N/A 02/17/2016   Procedure: Left Heart Cath and Coronary Angiography;  Surgeon: Jettie Booze, MD;  Location: [place] [facility];  Service: Cardiovascular;  Laterality: N/A;  . CARDIOVERSION  03/22/2012   Procedure: CARDIOVERSION;  Surgeon: Candee Furbish, MD;  Location: [facility] ENDOSCOPY;  Service: Cardiovascular;  Laterality: N/A;  . CARDIOVERSION  04/19/2012   Procedure: CARDIOVERSION;  Surgeon: Sinclair Grooms, MD;  Location: [place];  Service: Cardiovascular;  Laterality: N/A;  . COLONOSCOPY N/A 04/11/2013   Procedure: COLONOSCOPY;  Surgeon: Inda Castle, MD;  [facility];  Service: Endoscopy;  Laterality: N/A;  . COLONOSCOPY N/A 04/11/2013   Procedure: COLONOSCOPY;  Surgeon: Inda Castle, MD;  Location: [facility]OS[facility];  Service: Endoscopy;  Laterality: N/A;  . EP  IMPLANTABLE DEVICE N/A 02/17/2016   Procedure: BiV ICD Insertion CRT-D;  Surgeon: Evans Lance, MD;  Location: [place] [facility];  Service: Cardiovascular;  Laterality: N/A;  . FINGER SURGERY  2012   "4th digit right hand; thumb on left hand"  . RADIOLOGY WITH ANESTHESIA N/A 02/11/2016   Procedure: MRI OF THE BRAIN WITHOUT CONTRAST, LUMBAR WITHOUT CONTRAST;  Surgeon: Medication Radiologist, MD;  Location: [place];  Service: Radiology;  Laterality: N/A;  DR. WOOD/MRI  . Skin melanocytoma excision  2012   "above left clavicle"  . TEE WITHOUT CARDIOVERSION  03/22/2012   Procedure: TRANSESOPHAGEAL ECHOCARDIOGRAM (TEE);  Surgeon: Candee Furbish, MD;  Location: [facility] ENDOSCOPY;  Service: Cardiovascular;  Laterality: N/A;  . TEE WITHOUT CARDIOVERSION N/A 02/08/2016   Procedure: TRANSESOPHAGEAL ECHOCARDIOGRAM (TEE);  Surgeon: Lelon Perla, MD;  Location: [facility] ENDOSCOPY;  Service: Cardiovascular;  Laterality: N/A;    There were no vitals filed for this visit.      Subjective Assessment - 07/26/16 1531    Subjective Pt reports being very activie today, having a lot of tightness and muscle fatigue.    Patient is accompained by: Family member   How long can you sit comfortably? sitting for 45 min and right leg gets numb   How long can you stand comfortably? without walker 60 sec.    How long can you walk comfortably? walks with rolling waker and pressure in right knee   Patient Stated  Goals regain previous mobility.  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 Yes   Pain Score 6    Pain Location Knee   Pain Orientation Right   Pain Descriptors / Indicators Tightness   Pain Type Chronic pain   Pain Onset More than a month ago                         [facility] Adult PT Treatment/Exercise - 07/26/16 0001      Knee/Hip Exercises: Aerobic   Nustep L3 x 10 minutes  Therapist present to discuss treatment      Knee/Hip Exercises: Machines for Strengthening   Total Gym Leg Press Seat 7; Bil LE 90# 3x10, RTLE 50# 3x10     Knee/Hip Exercises: Standing   Walking with Sports Cord 4 directions 5x with 20#  Close SBA, needed sitting rest break after first 2 direction                  PT Short Term Goals - 07/10/16 1616      PT SHORT TERM GOAL #3   Title right knee extension AROM in sitting -30 degress due to increase strength   Time 4   Period Weeks   Status On-going     PT SHORT TERM GOAL #4   Title pain in right leg decreased >/= 25%    Time 4   Period Weeks   Status Achieved     PT SHORT TERM GOAL #5   Title able to get in and out of car without helping his right leg due to increased strength   Time 4   Period Weeks   Status Achieved           PT Long Term Goals - 07/24/16 1520      PT LONG TERM GOAL #2   Title able to stand for 25 min with pain decreased >/= 50% so he is able to cook a meal.   Time 8   Period Weeks   Status Achieved     PT LONG TERM GOAL #3   Title going up and down stairs with step to step with a cane   Time 8   Period Weeks   Status Achieved     PT LONG TERM GOAL #4   Title sit on stool and scoot around office to assess patients   Time 8   Period Weeks   Status Achieved               Plan - 07/26/16 1603    Clinical Impression Statement Pt able to complete all exercises well with some fatigue. Having some increased muscle soreness after last session but pt conitnues to progress with strength, stability and balance. Pt will return to surgery next week. Pt will continue to benefit from skilled therapy for LE strenghtening.    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   PT Frequency 3x / week   PT Duration Other (comment)   PT Treatment/Interventions ADLs/Self Care Home Management;Cryotherapy;Electrical Stimulation;Functional mobility  training;Stair training;Gait training;Ultrasound;Moist Heat;Therapeutic activities;Therapeutic exercise;Balance training;Neuromuscular re-education;Patient/family education;Passive range of motion;Manual techniques;Energy conservation;Other (comment)   PT Next Visit Plan Continue with resisted walking, leg press   Consulted and Agree with Plan of Care Patient      Patient will benefit from skilled therapeutic intervention  in order to improve the following deficits and impairments:  Abnormal gait, Difficulty walking, Decreased endurance, Pain, Decreased activity tolerance, Decreased balance, Decreased strength, Decreased mobility  Visit Diagnosis: Muscle weakness (generalized)     Problem List Patient Active Problem List   Diagnosis Date Noted  . Right ankle pain   . Pain   . Tibial pain   . Acute idiopathic gout of right ankle   . Right knee pain   . Diabetes mellitus type 2 in obese ([place])   . Labile blood pressure   . Acute blood loss anemia   . Diabetes mellitus type 2 in nonobese ([facility])   . Debility 02/22/2016  . 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 Weakness of both lower extremities   . Lower extremity weakness   . Pneumonia   . Acute lumbar back pain   . Non-traumatic rhabdomyolysis   . Retroperitoneal bleed   . Back pain   . Leg weakness, bilateral   . Paroxysmal atrial fibrillation ([place])   . Ventricular fibrillation ([place]) 02/09/2016  . Aortic aneurysm without rupture ([place]) 02/09/2016  . Aortic insufficiency 02/09/2016  . HCAP (healthcare-associated pneumonia)   . Cardiac arrest ([place]) 02/01/2016  . Acute on chronic systolic heart failure ([place])   . Acute encephalopathy   . Thyroid activity decreased   . Acute hypoxemic respiratory failure ([place])   . Acute respiratory failure ([place])   . Encounter for central line placement   . Arrhythmia   . Altered mental status   . CKD (chronic kidney disease), stage IV ([place]) 08/06/2013    Class: Chronic  .  Long term (current) use of anticoagulants 08/06/2013    Class: Chronic  . Special screening for malignant neoplasms, colon 04/11/2013  . Colon cancer screening 03/04/2013  . Chronic anticoagulation 03/04/2013  . Atrial flutter ([place]) 03/18/2012    Class: Acute  . Chronic systolic heart failure ([place]) 02/13/2012    Class: Acute  . Hypertension, accelerated 02/13/2012  . Aortic valve regurgitation, acquired 02/13/2012  . Aortic root enlargement ([place]) 02/13/2012  . Hyperlipidemia 02/13/2012  . Left bundle branch block 02/13/2012  . Obesity (BMI 30-39.9) 02/13/2012    Mikle Bosworth PTA 07/26/2016, 4:12 PM  [facility] [address], [place], [place], [address] Phone: [phone]   Fax:  [phone]  Name: Eric Kuhnert  MD MRN: [id number] Date of Birth: [birth year]

## 2016-08-02 ENCOUNTER — Encounter: Payer: Self-pay | Admitting: Physical Therapy

## 2016-08-02 ENCOUNTER — Ambulatory Visit: Payer: BC Managed Care – PPO | Admitting: Physical Therapy

## 2016-08-02 DIAGNOSIS — M6281 Muscle weakness (generalized): Secondary | ICD-10-CM

## 2016-08-02 NOTE — Therapy (Signed)
[facility] [address], [place], [place], [address] Phone: [phone]   Fax:  [phone]  Physical Therapy Treatment  Patient Details  Name: Eric Calvin  MD MRN: [id number] Date of Birth: 1950/12/08 Referring Provider: Dr. Roe Coombs  Encounter Date: 08/02/2016      PT End of Session - 08/02/16 1454    Visit Number 30   Date for PT Re-Evaluation 08/26/16   Authorization Type BCBS   PT Start Time 1448   PT Stop Time 1530   PT Time Calculation (min) 42 min   Activity Tolerance Patient tolerated treatment well   Behavior During Therapy [facility] for tasks assessed/performed      Past Medical History:  Diagnosis Date  . CHF (congestive heart failure) ([place])   . Chronic kidney disease    kidney fx studies increased   . Chronic lower back pain   . Claustrophobia   . Dysrhythmia    "palpitations"  . Exertional dyspnea 01/2012  . Heart murmur   . Hypertension   . Hypothyroidism   . Migraine 02/13/12   "opthalmic"  . Varicose vein of leg    right    Past Surgical History:  Procedure Laterality Date  . CARDIAC CATHETERIZATION N/A 02/17/2016   Procedure: Left Heart Cath and Coronary Angiography;  Surgeon: Jettie Booze, MD;  Location: [place] CV [facility];  Service: Cardiovascular;  Laterality: N/A;  . CARDIOVERSION  03/22/2012   Procedure: CARDIOVERSION;  Surgeon: Candee Furbish, MD;  Location: [facility] ENDOSCOPY;  Service: Cardiovascular;  Laterality: N/A;  . CARDIOVERSION  04/19/2012   Procedure: CARDIOVERSION;  Surgeon: Sinclair Grooms, MD;  Location: [place];  Service: Cardiovascular;  Laterality: N/A;  . COLONOSCOPY N/A 04/11/2013   Procedure: COLONOSCOPY;  Surgeon: Inda Castle, MD;  Location: [facility] ENDOSCOPY;  Service: Endoscopy;  Laterality: N/A;  . COLONOSCOPY N/A 04/11/2013   Procedure: COLONOSCOPY;  Surgeon: Inda Castle, MD;  Location: [facility] ENDOSCOPY;  Service: Endoscopy;  Laterality: N/A;  . EP  IMPLANTABLE DEVICE N/A 02/17/2016   Procedure: BiV ICD Insertion CRT-D;  Surgeon: Evans Lance, MD;  Location: [place] CV LAB;  Service: Cardiovascular;  Laterality: N/A;  . FINGER SURGERY  2012   "4th digit right hand; thumb on left hand"  . RADIOLOGY WITH ANESTHESIA N/A 02/11/2016   Procedure: MRI OF THE BRAIN WITHOUT CONTRAST, LUMBAR WITHOUT CONTRAST;  Surgeon: Medication Radiologist, MD;  Location: [place];  Service: Radiology;  Laterality: N/A;  DR. WOOD/MRI  . Skin melanocytoma excision  2012   "above left clavicle"  . TEE WITHOUT CARDIOVERSION  03/22/2012   Procedure: TRANSESOPHAGEAL ECHOCARDIOGRAM (TEE);  Surgeon: Candee Furbish, MD;  Location: [facility] ENDOSCOPY;  Service: Cardiovascular;  Laterality: N/A;  . TEE WITHOUT CARDIOVERSION N/A 02/08/2016   Procedure: TRANSESOPHAGEAL ECHOCARDIOGRAM (TEE);  Surgeon: Lelon Perla, MD;  Location: [facility] ENDOSCOPY;  Service: Cardiovascular;  Laterality: N/A;    There were no vitals filed for this visit.      Subjective Assessment - 08/02/16 1451    Subjective Did very well with surgery. Little things like manipulating foot plates he has gotten more independent in. Is not using the cane for office visits at this point.    Currently in Pain? Yes   Pain Score 2    Pain Location --  Thigh/muscluar   Pain Orientation Right   Pain Descriptors / Indicators Sore   Aggravating Factors  Stiffness each morning, more muscles soreness than anything else  Pain Relieving Factors rest, getting help   Multiple Pain Sites No                         [facility] Adult PT Treatment/Exercise - 08/02/16 0001      Knee/Hip Exercises: Aerobic   Nustep L4 x 10 min  Concurent review of status     Knee/Hip Exercises: Machines for Strengthening   Total Gym Leg Press Seat 7:Bil 90# 3x10,      Knee/Hip Exercises: Standing   Walking with Sports Cord 4 directions 25# 10x each                  PT Short Term Goals - 07/10/16 1616      PT  SHORT TERM GOAL #3   Title right knee extension AROM in sitting -30 degress due to increase strength   Time 4   Period Weeks   Status On-going     PT SHORT TERM GOAL #4   Title pain in right leg decreased >/= 25%    Time 4   Period Weeks   Status Achieved     PT SHORT TERM GOAL #5   Title able to get in and out of car without helping his right leg due to increased strength   Time 4   Period Weeks   Status Achieved           PT Long Term Goals - 07/24/16 1520      PT LONG TERM GOAL #2   Title able to stand for 25 min with pain decreased >/= 50% so he is able to cook a meal.   Time 8   Period Weeks   Status Achieved     PT LONG TERM GOAL #3   Title going up and down stairs with step to step with a cane   Time 8   Period Weeks   Status Achieved     PT LONG TERM GOAL #4   Title sit on stool and scoot around office to assess patients   Time 8   Period Weeks   Status Achieved               Plan - 08/02/16 1454    Clinical Impression Statement Increased resistance with resisted walking today. This fatigued both LE. LE were fatigued enough where the leg press resistance was sufficient.    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   PT Frequency 3x / week   PT Duration Other (comment)   PT Treatment/Interventions ADLs/Self Care Home Management;Cryotherapy;Electrical Stimulation;Functional mobility training;Stair training;Gait training;Ultrasound;Moist Heat;Therapeutic activities;Therapeutic exercise;Balance training;Neuromuscular re-education;Patient/family education;Passive range of motion;Manual techniques;Energy conservation;Other (comment)   PT Next Visit Plan Continue with resisted walking, leg press, try stepping over objects without device   Consulted and Agree with Plan of Care Patient      Patient will benefit from skilled therapeutic intervention in order  to improve the following deficits and impairments:  Abnormal gait, Difficulty walking, Decreased endurance, Pain, Decreased activity tolerance, Decreased balance, Decreased strength, Decreased mobility  Visit Diagnosis: Muscle weakness (generalized)     Problem List Patient Active Problem List   Diagnosis Date Noted  . Right ankle pain   . Pain   . Tibial pain   . Acute idiopathic gout of right ankle   . Right knee pain   . Diabetes mellitus type 2 in obese ([place])   .  Labile blood pressure   . Acute blood loss anemia   . Diabetes mellitus type 2 in nonobese (H[facility])   . Debility 02/22/2016  . 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 Weakness of both lower extremities   . Lower extremity weakness   . Pneumonia   . Acute lumbar back pain   . Non-traumatic rhabdomyolysis   . Retroperitoneal bleed   . Back pain   . Leg weakness, bilateral   . Paroxysmal atrial fibrillation ([place])   . Ventricular fibrillation ([place]) 02/09/2016  . Aortic aneurysm without rupture ([place]) 02/09/2016  . Aortic insufficiency 02/09/2016  . HCAP (healthcare-associated pneumonia)   . Cardiac arrest ([place]) 02/01/2016  . Acute on chronic systolic heart failure ([place])   . Acute encephalopathy   . Thyroid activity decreased   . Acute hypoxemic respiratory failure ([place])   . Acute respiratory failure ([place])   . Encounter for central line placement   . Arrhythmia   . Altered mental status   . CKD (chronic kidney disease), stage IV ([place]) 08/06/2013    Class: Chronic  . Long term (current) use of anticoagulants 08/06/2013    Class: Chronic  . Special screening for malignant neoplasms, colon 04/11/2013  . Colon cancer screening 03/04/2013  . Chronic anticoagulation 03/04/2013  . Atrial flutter ([place]) 03/18/2012    Class: Acute  . Chronic systolic heart failure ([place]) 02/13/2012    Class: Acute  . Hypertension, accelerated 02/13/2012  . Aortic valve regurgitation, acquired 02/13/2012  . Aortic  root enlargement ([place]) 02/13/2012  . Hyperlipidemia 02/13/2012  . Left bundle branch block 02/13/2012  . Obesity (BMI 30-39.9) 02/13/2012    Nela Bascom, PTA 08/02/2016, 3:27 PM  [facility] [address] [place], [place], [address] Phone: [phone]   Fax:  [phone]  Name: Cory Kitt  MD MRN: [id number] Date of Birth: 1951/05/30

## 2016-08-04 ENCOUNTER — Ambulatory Visit: Payer: BC Managed Care – PPO | Admitting: Physical Therapy

## 2016-08-04 ENCOUNTER — Encounter: Payer: Self-pay | Admitting: Physical Therapy

## 2016-08-04 DIAGNOSIS — M6281 Muscle weakness (generalized): Secondary | ICD-10-CM

## 2016-08-04 DIAGNOSIS — R2689 Other abnormalities of gait and mobility: Secondary | ICD-10-CM

## 2016-08-04 NOTE — Therapy (Signed)
[facility] [address] [place], [place], [address] Phone: [phone]   Fax:  [phone]  Physical Therapy Treatment  Patient Details  Name: Eric Haen  MD MRN: [id number] Date of Birth: 14-Nov-1950 Referring Provider: Dr. Roe Coombs  Encounter Date: 08/04/2016      PT End of Session - 08/04/16 1048    Visit Number 31   Date for PT Re-Evaluation 08/26/16   Authorization Type BCBS   PT Start Time 1025  came late   PT Stop Time 1103   PT Time Calculation (min) 38 min   Activity Tolerance Patient tolerated treatment well   Behavior During Therapy [facility] for tasks assessed/performed      Past Medical History:  Diagnosis Date  . CHF (congestive heart failure) ([place])   . Chronic kidney disease    kidney fx studies increased   . Chronic lower back pain   . Claustrophobia   . Dysrhythmia    "palpitations"  . Exertional dyspnea 01/2012  . Heart murmur   . Hypertension   . Hypothyroidism   . Migraine 02/13/12   "opthalmic"  . Varicose vein of leg    right    Past Surgical History:  Procedure Laterality Date  . CARDIAC CATHETERIZATION N/A 02/17/2016   Procedure: Left Heart Cath and Coronary Angiography;  Surgeon: Jettie Booze, MD;  Location: [place] CV LAB;  Service: Cardiovascular;  Laterality: N/A;  . CARDIOVERSION  03/22/2012   Procedure: CARDIOVERSION;  Surgeon: Candee Furbish, MD;  Location: [facility] ENDOSCOPY;  Service: Cardiovascular;  Laterality: N/A;  . CARDIOVERSION  04/19/2012   Procedure: CARDIOVERSION;  Surgeon: Sinclair Grooms, MD;  Location: [place];  Service: Cardiovascular;  Laterality: N/A;  . COLONOSCOPY N/A 04/11/2013   Procedure: COLONOSCOPY;  Surgeon: Inda Castle, MD;  Location: [facility] ENDOSCOPY;  Service: Endoscopy;  Laterality: N/A;  . COLONOSCOPY N/A 04/11/2013   Procedure: COLONOSCOPY;  Surgeon: Inda Castle, MD;  Location: [facility] ENDOSCOPY;  Service: Endoscopy;  Laterality: N/A;   . EP IMPLANTABLE DEVICE N/A 02/17/2016   Procedure: BiV ICD Insertion CRT-D;  Surgeon: Evans Lance, MD;  Location: [place] CV LAB;  Service: Cardiovascular;  Laterality: N/A;  . FINGER SURGERY  2012   "4th digit right hand; thumb on left hand"  . RADIOLOGY WITH ANESTHESIA N/A 02/11/2016   Procedure: MRI OF THE BRAIN WITHOUT CONTRAST, LUMBAR WITHOUT CONTRAST;  Surgeon: Medication Radiologist, MD;  Location: [place];  Service: Radiology;  Laterality: N/A;  DR. WOOD/MRI  . Skin melanocytoma excision  2012   "above left clavicle"  . TEE WITHOUT CARDIOVERSION  03/22/2012   Procedure: TRANSESOPHAGEAL ECHOCARDIOGRAM (TEE);  Surgeon: Candee Furbish, MD;  Location: [facility] ENDOSCOPY;  Service: Cardiovascular;  Laterality: N/A;  . TEE WITHOUT CARDIOVERSION N/A 02/08/2016   Procedure: TRANSESOPHAGEAL ECHOCARDIOGRAM (TEE);  Surgeon: Lelon Perla, MD;  Location: [facility] ENDOSCOPY;  Service: Cardiovascular;  Laterality: N/A;    There were no vitals filed for this visit.      Subjective Assessment - 08/04/16 1030    Subjective After the last workout everything was very tight.  Stamina for walking distances are not good. I had trouble walking back to my car after the a engagement at the [employer]. I was able to do an operation this week.  Patient has to use pedals with right foot.    Patient is accompained by: Family member   How long can you sit comfortably? sitting for 45  min and right leg gets numb   How long can you stand comfortably? without walker 60 sec.    How long can you walk comfortably? walks with rolling waker and pressure in right knee   Patient Stated Goals regain previous mobility.  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 Yes   Pain Score 4    Pain Location --  nerve pain in right thigh   Pain Orientation Right   Pain Descriptors / Indicators Sore   Pain Type Chronic pain   Pain Radiating Towards  down right leg   Pain Onset More than a month ago   Pain Frequency Intermittent   Aggravating Factors  stiffness each morning, more muscle soreness   Pain Relieving Factors rest   Multiple Pain Sites No            [facility] PT Assessment - 08/04/16 0001      AROM   Right Knee Extension -20  unable to hold the postion     Strength   Overall Strength Comments SLR with slight knee bend  to 35 degrees of hip flexion   Right Hip Flexion 4/5  tested in supine   Right Hip Extension 4+/5   Right Hip ABduction 4-/5   Right Knee Extension 2+/5  In sitting pt cannot extend the knee with full ROM                     [facility] Adult PT Treatment/Exercise - 08/04/16 0001      Knee/Hip Exercises: Stretches   Hip Flexor Stretch 2 reps;Right;30 seconds     Knee/Hip Exercises: Aerobic   Nustep L4 x 8 min  Concurent review of status     Knee/Hip Exercises: Machines for Strengthening   Total Gym Leg Press Seat 7:Bil 90# 3x10, right leg 50# 10x2     Knee/Hip Exercises: Standing   Walking with Sports Cord 4 directions 25# 10x each                PT Education - 08/04/16 1112    Education provided No          PT Short Term Goals - 07/10/16 1616      PT SHORT TERM GOAL #3   Title right knee extension AROM in sitting -30 degress due to increase strength   Time 4   Period Weeks   Status On-going     PT SHORT TERM GOAL #4   Title pain in right leg decreased >/= 25%    Time 4   Period Weeks   Status Achieved     PT SHORT TERM GOAL #5   Title able to get in and out of car without helping his right leg due to increased strength   Time 4   Period Weeks   Status Achieved           PT Long Term Goals - 08/04/16 1033      PT LONG TERM GOAL #1   Title independent with HEP and understand how to progress himself   Time 8   Period Weeks   Status On-going     PT LONG TERM GOAL #2   Title able to stand for 25 min with pain decreased >/= 50% so he is able to  cook a meal.   Time 8   Period Weeks   Status Achieved     PT  LONG TERM GOAL #3   Title going up and down stairs with step to step with a cane   Time 8   Period Weeks   Status Achieved     PT LONG TERM GOAL #4   Title sit on stool and scoot around office to assess patients   Time 8   Period Weeks   Status Achieved     PT LONG TERM GOAL #5   Title return to household chores due to lower extremity strength >/= 4/5   Period Weeks   Status On-going     PT LONG TERM GOAL #6   Title return to driving due to increased right lower extremity strength >/= 4/5   Time 8   Period Weeks   Status On-going     PT LONG TERM GOAL #7   Title pain decreased >/= 50% in right knee   Time 8   Period Weeks   Status On-going               Plan - 08/04/16 1112    Clinical Impression Statement Patient has increased strength of right leg.  He is able to do a SLR now to 30 degrees with knee flexed a little.  Patient was able to perform a surgery for first time.  Patient has not started driving yet.  Patient has decreased endurance to walk from parking lot to event.  Patient has difficulty walking withou an assistive device in the operating room.  Patient will benefit from physical therapy to improve right LE strength to improve function.    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   PT Frequency 3x / week   PT Duration Other (comment)  4 months   PT Treatment/Interventions ADLs/Self Care Home Management;Cryotherapy;Electrical Stimulation;Functional mobility training;Stair training;Gait training;Ultrasound;Moist Heat;Therapeutic activities;Therapeutic exercise;Balance training;Neuromuscular re-education;Patient/family education;Passive range of motion;Manual techniques;Energy conservation;Other (comment)   PT Next Visit Plan Continue with resisted walking, leg press, try stepping over objects  without device   PT Home Exercise Plan progress as needed   Consulted and Agree with Plan of Care Patient      Patient will benefit from skilled therapeutic intervention in order to improve the following deficits and impairments:  Abnormal gait, Difficulty walking, Decreased endurance, Pain, Decreased activity tolerance, Decreased balance, Decreased strength, Decreased mobility  Visit Diagnosis: Muscle weakness (generalized)  Other abnormalities of gait and mobility     Problem List Patient Active Problem List   Diagnosis Date Noted  . Right ankle pain   . Pain   . Tibial pain   . Acute idiopathic gout of right ankle   . Right knee pain   . Diabetes mellitus type 2 in obese ([place])   . Labile blood pressure   . Acute blood loss anemia   . Diabetes mellitus type 2 in nonobese ([facility])   . Debility 02/22/2016  . 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 Weakness of both lower extremities   . Lower extremity weakness   . Pneumonia   . Acute lumbar back pain   . Non-traumatic rhabdomyolysis   . Retroperitoneal bleed   . Back pain   . Leg weakness, bilateral   . Paroxysmal atrial fibrillation ([place])   . Ventricular fibrillation ([place]) 02/09/2016  . Aortic aneurysm without rupture ([place]) 02/09/2016  . Aortic insufficiency 02/09/2016  . HCAP (healthcare-associated pneumonia)   . Cardiac arrest ([place]) 02/01/2016  . Acute on  chronic systolic heart failure ([place])   . Acute encephalopathy   . Thyroid activity decreased   . Acute hypoxemic respiratory failure ([place])   . Acute respiratory failure ([place])   . Encounter for central line placement   . Arrhythmia   . Altered mental status   . CKD (chronic kidney disease), stage IV ([place]) 08/06/2013    Class: Chronic  . Long term (current) use of anticoagulants 08/06/2013    Class: Chronic  . Special screening for malignant neoplasms, colon 04/11/2013  . Colon cancer screening 03/04/2013  . Chronic anticoagulation  03/04/2013  . Atrial flutter ([place]) 03/18/2012    Class: Acute  . Chronic systolic heart failure ([place]) 02/13/2012    Class: Acute  . Hypertension, accelerated 02/13/2012  . Aortic valve regurgitation, acquired 02/13/2012  . Aortic root enlargement ([place]) 02/13/2012  . Hyperlipidemia 02/13/2012  . Left bundle branch block 02/13/2012  . Obesity (BMI 30-39.9) 02/13/2012    Earlie Counts, PT 08/04/16 11:16 AM   [facility] [address], [place], [place], [address] Phone: [phone]   Fax:  [phone]  Name: Jenna Ardoin  MD MRN: [id number] Date of Birth: 1951/01/28

## 2016-08-07 ENCOUNTER — Encounter: Payer: Self-pay | Admitting: Interventional Cardiology

## 2016-08-07 ENCOUNTER — Ambulatory Visit (INDEPENDENT_AMBULATORY_CARE_PROVIDER_SITE_OTHER): Payer: BC Managed Care – PPO | Admitting: Interventional Cardiology

## 2016-08-07 VITALS — BP 144/90 | HR 72 | Ht 69.0 in | Wt 221.0 lb

## 2016-08-07 DIAGNOSIS — I5022 Chronic systolic (congestive) heart failure: Secondary | ICD-10-CM

## 2016-08-07 DIAGNOSIS — Z95810 Presence of automatic (implantable) cardiac defibrillator: Secondary | ICD-10-CM

## 2016-08-07 DIAGNOSIS — I469 Cardiac arrest, cause unspecified: Secondary | ICD-10-CM | POA: Diagnosis not present

## 2016-08-07 DIAGNOSIS — I484 Atypical atrial flutter: Secondary | ICD-10-CM

## 2016-08-07 DIAGNOSIS — I447 Left bundle-branch block, unspecified: Secondary | ICD-10-CM | POA: Diagnosis not present

## 2016-08-07 DIAGNOSIS — Z7901 Long term (current) use of anticoagulants: Secondary | ICD-10-CM

## 2016-08-07 DIAGNOSIS — I351 Nonrheumatic aortic (valve) insufficiency: Secondary | ICD-10-CM

## 2016-08-07 DIAGNOSIS — Z79899 Other long term (current) drug therapy: Secondary | ICD-10-CM | POA: Diagnosis not present

## 2016-08-07 DIAGNOSIS — I10 Essential (primary) hypertension: Secondary | ICD-10-CM

## 2016-08-07 HISTORY — DX: Other long term (current) drug therapy: Z79.899

## 2016-08-07 NOTE — Patient Instructions (Signed)
Medication Instructions:  None  Labwork: Your physician recommends that you return for lab work in: December (TSH, BNP and CMET)   Testing/Procedures: None  Follow-Up: Your physician wants you to follow-up in: 6-7 months with Dr. Gaspar Bidding will receive a reminder letter in the mail two months in advance. If you don't receive a letter, please call our office to schedule the follow-up appointment.   Any Other Special Instructions Will Be Listed Below (If Applicable).  You have been referred to Phase 2 Cardiac Rehab.     If you need a refill on your cardiac medications before your next appointment, please call your pharmacy.

## 2016-08-07 NOTE — Progress Notes (Signed)
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 Atrial Fibrillation  . 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  He is performing surgery 2 days a week. He is back in his office 4 hours several days per week. He denies syncope. No chest pain. No palpitations. Overall he feels significant improvement is occurring.  Past Medical History:  Diagnosis Date  . CHF (congestive heart failure) ([place])   . Chronic kidney disease    kidney fx studies increased   . Chronic lower back pain   . Claustrophobia   . Dysrhythmia    "palpitations"  . Exertional dyspnea 01/2012  . Heart murmur   . Hypertension   . Hypothyroidism   . Migraine 02/13/12   "opthalmic"  . Varicose vein of leg    right    Past Surgical History:  Procedure Laterality Date  . CARDIAC CATHETERIZATION N/A 02/17/2016   Procedure: Left Heart Cath and Coronary Angiography;  Surgeon: Jettie Booze, MD;  Location: [place] [facility];  Service: Cardiovascular;  Laterality: N/A;  . CARDIOVERSION  03/22/2012   Procedure: CARDIOVERSION;  Surgeon: Candee Furbish, MD;  Location: [facility] ENDOSCOPY;  Service: Cardiovascular;  Laterality: N/A;  . CARDIOVERSION  04/19/2012   Procedure: CARDIOVERSION;  Surgeon: Eric Grooms, MD;  Location: [place];  Service: Cardiovascular;  Laterality: N/A;  . COLONOSCOPY N/A 04/11/2013   Procedure: COLONOSCOPY;  Surgeon: Inda Castle, MD;  Location: [facility] ENDOSCOPY;  Service: Endoscopy;  Laterality: N/A;  . COLONOSCOPY N/A 04/11/2013   Procedure:  COLONOSCOPY;  Surgeon: Inda Castle, MD;  Location: [facility] ENDOSCOPY;  Service: Endoscopy;  Laterality: N/A;  . EP IMPLANTABLE DEVICE N/A 02/17/2016   Procedure: BiV ICD Insertion CRT-D;  Surgeon: Evans Lance, MD;  Location: [place] [facility];  Service: Cardiovascular;  Laterality: N/A;  . FINGER SURGERY  2012   "4th digit right hand; thumb on left hand"  . RADIOLOGY WITH ANESTHESIA N/A 02/11/2016   Procedure: MRI OF THE BRAIN WITHOUT CONTRAST, LUMBAR WITHOUT CONTRAST;  Surgeon: Medication Radiologist, MD;  Location: [place];  Service: Radiology;  Laterality: N/A;  DR. WOOD/MRI  . Skin melanocytoma excision  2012   "above left clavicle"  . TEE WITHOUT CARDIOVERSION  03/22/2012   Procedure: TRANSESOPHAGEAL ECHOCARDIOGRAM (TEE);  Surgeon: Candee Furbish, MD;  Location: [facility] ENDOSCOPY;  Service: Cardiovascular;  Laterality: N/A;  . TEE WITHOUT CARDIOVERSION N/A 02/08/2016   Procedure: TRANSESOPHAGEAL ECHOCARDIOGRAM (TEE);  Surgeon: Lelon Perla, MD;  Location: [facility] ENDOSCOPY;  Service: Cardiovascular;  Laterality: N/A;    Current Medications: Outpatient Medications Prior to Visit  Medication Sig Dispense Refill  . amiodarone (PACERONE) 200 MG tablet Take 1 tablet (200 mg total) by mouth daily. 30 tablet 11  . apixaban (ELIQUIS) 5 MG TABS tablet Take 1 tablet (5 mg total) by mouth 2 (two) times daily. 60 tablet 11  . carvedilol (COREG) 25 MG tablet Take 1 tablet (25 mg total) by mouth 2 (two) times daily. 60 tablet 0  . diclofenac sodium (VOLTAREN) 1 % GEL  Apply 2 g topically 4 (four) times daily. 1 Tube 1  . docusate sodium (COLACE) 100 MG capsule Take 100 mg by mouth daily as needed for mild constipation.    . fluticasone (FLONASE) 50 MCG/ACT nasal spray Place 2 sprays into the nose as needed for allergies.     . furosemide (LASIX) 40 MG tablet Take 1 tablet (40 mg total) by mouth daily. 90 tablet 0  . isosorbide-hydrALAZINE (BIDIL) 20-37.5 MG tablet Take 0.5 tablets by mouth 3 (three) times daily.  90 tablet 0  . levothyroxine (SYNTHROID, LEVOTHROID) 75 MCG tablet Take 1 tablet (75 mcg total) by mouth daily before breakfast. 30 tablet 11  . magnesium oxide (MAG-OX) 400 MG tablet Take 1 tablet (400 mg total) by mouth daily. 30 tablet 1  . oxyCODONE-acetaminophen (PERCOCET/ROXICET) 5-325 MG tablet Take 1 tablet by mouth every 6 (six) hours as needed for moderate pain. 90 tablet 0  . doxycycline (VIBRA-TABS) 100 MG tablet Take 1 tablet by mouth 2 (two) times daily.  0  . meloxicam (MOBIC) 7.5 MG tablet Take 1 tablet by mouth daily.  0   No facility-administered medications prior to visit.      Allergies:   Lactose intolerance (gi)   Social History   Social History  . Marital status: Married    Spouse name: [name]  . Number of children: 1  . Years of education: N/A   Occupational History  .  [employer]   Social History Main Topics  . Smoking status: Never Smoker  . Smokeless tobacco: Never Used  . Alcohol use Yes     Comment: 02/13/12 "socially"  . Drug use: No  . Sexual activity: Not Currently   Other Topics Concern  . 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 He is married.. He has one son.     Family History:  The patient's family history includes Diabetes in his mother; Heart disease in his father and mother; Heart failure in his father and mother; Hypertension in his father and mother.   ROS:   Please see the history of present illness.    Snoring. The right lower extremity is weak but improving. Occasional LE edema.He has back pain. Otherwise no complaints  All other systems reviewed and are negative.   PHYSICAL EXAM:   VS:  BP (!) 144/90   Pulse 72   Ht 5\' 9"  (1.753 m)   Wt 221 lb (100.2 kg)   SpO2 98%   BMI 32.64 kg/m    GEN: Well nourished, well developed, in no acute distress  HEENT: normal  Neck: no JVD, carotid bruits, or masses Cardiac: RRR; There is a 2-3/6 decrescendo aortic regurgitation  murmurs. No rubs, or gallops,no edema  Respiratory:  clear to auscultation bilaterally, normal work of breathing GI: soft, nontender, nondistended, + BS MS: no deformity or atrophy  Skin: warm and dry, no rash Neuro:  Alert and Oriented x 3, Strength and sensation are intact Psych: euthymic mood, full affect  Wt Readings from Last 3 Encounters:  08/07/16 221 lb (100.2 kg)  05/25/16 221 lb (100.2 kg)  04/24/16 219 lb (99.3 kg)      Studies/Labs Reviewed:   EKG:  EKG  Sinus rhythm with left bundle branch block, ventricular pacing, and otherwise unremarkable.  Recent Labs: 02/05/2016: B Natriuretic Peptide 804.7 02/11/2016: TSH 2.861 02/18/2016: Magnesium 2.0 03/20/2016: ALT 15; BUN 18; Creat 1.56; Hemoglobin 10.0; Platelets 293; Potassium 4.1; Sodium  138   Lipid Panel    Component Value Date/Time   CHOL 161 02/13/2012 1829   TRIG 82 02/01/2016 0105   HDL 42 02/13/2012 1829   CHOLHDL 3.8 02/13/2012 1829   VLDL 30 02/13/2012 1829   LDLCALC 89 02/13/2012 1829    Additional studies/ records that were reviewed today include:  No new data    ASSESSMENT:    1. Chronic systolic heart failure ([place])   2. Long term current use of amiodarone   3. Essential hypertension   4. Atypical atrial flutter ([place])   5. Nonrheumatic aortic valve insufficiency   6. Chronic anticoagulation   7. Left bundle branch block   8. Biventricular implantable cardioverter-defibrillator in situ   9. Cardiac arrest [facility])      PLAN:  In order of problems listed above:  1. There is no volume overload at this time. CHF is stable. 2. On amiodarone 300 mg daily. Plan TSH and liver panel.  3. Borderline systolic BP elevation. 4. Maintaining NSR 5. By auscultation and clinical parameters, the AR is moderate. 6. Biventricular pacer, with normal function based on ECG. Plan echo in spring.    Medication Adjustments/Labs and Tests Ordered: Current medicines are reviewed at length with the patient today.   Concerns regarding medicines are outlined above.  Medication changes, Labs and Tests ordered today are listed in the Patient Instructions below. Patient Instructions  Medication Instructions:  None  Labwork: Your physician recommends that you return for lab work in: December (TSH, BNP and CMET)   Testing/Procedures: None  Follow-Up: Your physician wants you to follow-up in: 6-7 months with Dr. Gaspar Bidding will receive a reminder letter in the mail two months in advance. If you don't receive a letter, please call our office to schedule the follow-up appointment.   Any Other Special Instructions Will Be Listed Below (If Applicable).  You have been referred to Phase 2 Cardiac Rehab.     If you need a refill on your cardiac medications before your next appointment, please call your pharmacy.      Signed, Eric Grooms, MD  08/07/2016 4:45 PM    [facility], [place], [place]  [address] Phone: [phone]; Fax: [phone]

## 2016-08-09 ENCOUNTER — Encounter: Payer: Self-pay | Admitting: Physical Therapy

## 2016-08-09 ENCOUNTER — Ambulatory Visit: Payer: BC Managed Care – PPO | Admitting: Physical Therapy

## 2016-08-09 DIAGNOSIS — M6281 Muscle weakness (generalized): Secondary | ICD-10-CM

## 2016-08-09 NOTE — Therapy (Signed)
[facility] [address], [place], [place], [address] Phone: [phone]   Fax:  [phone]  Physical Therapy Treatment  Patient Details  Name: Eric Hafley  MD MRN: [id number] Date of Birth: 1951/09/17 Referring Provider: Dr. Roe Coombs  Encounter Date: 08/09/2016      PT End of Session - 08/09/16 1451    Visit Number 32   Date for PT Re-Evaluation 08/26/16   Authorization Type BCBS   PT Start Time 1448   PT Stop Time 1530   PT Time Calculation (min) 42 min   Activity Tolerance Patient tolerated treatment well   Behavior During Therapy [facility] for tasks assessed/performed      Past Medical History:  Diagnosis Date  . CHF (congestive heart failure) ([place])   . Chronic kidney disease    kidney fx studies increased   . Chronic lower back pain   . Claustrophobia   . Dysrhythmia    "palpitations"  . Exertional dyspnea 01/2012  . Heart murmur   . Hypertension   . Hypothyroidism   . Migraine 02/13/12   "opthalmic"  . Varicose vein of leg    right    Past Surgical History:  Procedure Laterality Date  . CARDIAC CATHETERIZATION N/A 02/17/2016   Procedure: Left Heart Cath and Coronary Angiography;  Surgeon: Jettie Booze, MD;  Location: [place] [facility];  Service: Cardiovascular;  Laterality: N/A;  . CARDIOVERSION  03/22/2012   Procedure: CARDIOVERSION;  Surgeon: Candee Furbish, MD;  Location: [facility] ENDOSCOPY;  Service: Cardiovascular;  Laterality: N/A;  . CARDIOVERSION  04/19/2012   Procedure: CARDIOVERSION;  Surgeon: Sinclair Grooms, MD;  Location: [place];  Service: Cardiovascular;  Laterality: N/A;  . COLONOSCOPY N/A 04/11/2013   Procedure: COLONOSCOPY;  Surgeon: Inda Castle, MD;  [facility];  Service: Endoscopy;  Laterality: N/A;  . COLONOSCOPY N/A 04/11/2013   Procedure: COLONOSCOPY;  Surgeon: Inda Castle, MD;  Location: [facility]OSCOPY;  Service: Endoscopy;  Laterality: N/A;  . EP  IMPLANTABLE DEVICE N/A 02/17/2016   Procedure: BiV ICD Insertion CRT-D;  Surgeon: Evans Lance, MD;  Location: [place] [facility];  Service: Cardiovascular;  Laterality: N/A;  . FINGER SURGERY  2012   "4th digit right hand; thumb on left hand"  . RADIOLOGY WITH ANESTHESIA N/A 02/11/2016   Procedure: MRI OF THE BRAIN WITHOUT CONTRAST, LUMBAR WITHOUT CONTRAST;  Surgeon: Medication Radiologist, MD;  Location: [place];  Service: Radiology;  Laterality: N/A;  DR. WOOD/MRI  . Skin melanocytoma excision  2012   "above left clavicle"  . TEE WITHOUT CARDIOVERSION  03/22/2012   Procedure: TRANSESOPHAGEAL ECHOCARDIOGRAM (TEE);  Surgeon: Candee Furbish, MD;  [facility] ENDOSCOPY;  Service: Cardiovascular;  Laterality: N/A;  . TEE WITHOUT CARDIOVERSION N/A 02/08/2016   Procedure: TRANSESOPHAGEAL ECHOCARDIOGRAM (TEE);  Surgeon: Lelon Perla, MD;  Location: [facility] ENDOSCOPY;  Service: Cardiovascular;  Laterality: N/A;    There were no vitals filed for this visit.      Subjective Assessment - 08/09/16 1453    Subjective Pt drove today and did well.    Currently in Pain? No/denies   Multiple Pain Sites No                         [facility] Adult PT Treatment/Exercise - 08/09/16 0001      Knee/Hip Exercises: Aerobic   Stationary Bike L2 x 10 min  Review of status concurrently  Knee/Hip Exercises: Machines for Strengthening   Cybex Knee Flexion 2x 10 1 PLATE, ADDED 5# ANKLE WEIGHT FOR SECOND WT.    Total Gym Leg Press Seat 7:Bil 90# 3x10, right leg 50# 10x2     Knee/Hip Exercises: Standing   Forward Step Up Right;1 set;10 reps;Hand Hold: 1;Step Height: 6"  Much improved facilitation of quad, less propulsive   Walking with Sports Cord 4 directions 25# 10x each                  PT Short Term Goals - 07/10/16 1616      PT SHORT TERM GOAL #3   Title right knee extension AROM in sitting -30 degress due to increase strength   Time 4   Period Weeks   Status On-going     PT  SHORT TERM GOAL #4   Title pain in right leg decreased >/= 25%    Time 4   Period Weeks   Status Achieved     PT SHORT TERM GOAL #5   Title able to get in and out of car without helping his right leg due to increased strength   Time 4   Period Weeks   Status Achieved           PT Long Term Goals - 08/04/16 1033      PT LONG TERM GOAL #1   Title independent with HEP and understand how to progress himself   Time 8   Period Weeks   Status On-going     PT LONG TERM GOAL #2   Title able to stand for 25 min with pain decreased >/= 50% so he is able to cook a meal.   Time 8   Period Weeks   Status Achieved     PT LONG TERM GOAL #3   Title going up and down stairs with step to step with a cane   Time 8   Period Weeks   Status Achieved     PT LONG TERM GOAL #4   Title sit on stool and scoot around office to assess patients   Time 8   Period Weeks   Status Achieved     PT LONG TERM GOAL #5   Title return to household chores due to lower extremity strength >/= 4/5   Period Weeks   Status On-going     PT LONG TERM GOAL #6   Title return to driving due to increased right lower extremity strength >/= 4/5   Time 8   Period Weeks   Status On-going     PT LONG TERM GOAL #7   Title pain decreased >/= 50% in right knee   Time 8   Period Weeks   Status On-going               Plan - 08/09/16 1657    Clinical Impression Statement Pt drove his car for the first time today and did well. Continues to tolerate all strengthening exercises well, in fact he performed step ups with 6 inch box with significantly improved quad control.       Patient will benefit from skilled therapeutic intervention in order to improve the following deficits and impairments:     Visit Diagnosis: Muscle weakness (generalized)     Problem List Patient Active Problem List   Diagnosis Date Noted  . Long term current use of amiodarone 08/07/2016  . Right ankle pain   . Pain   .  Tibial pain   .  Acute idiopathic gout of right ankle   . Right knee pain   . Diabetes mellitus type 2 in obese ([place])   . Labile blood pressure   . Acute blood loss anemia   . Diabetes mellitus type 2 in nonobese ([facility])   . Debility 02/22/2016  . 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 Weakness of both lower extremities   . Lower extremity weakness   . Pneumonia   . Acute lumbar back pain   . Non-traumatic rhabdomyolysis   . Retroperitoneal bleed   . Back pain   . Leg weakness, bilateral   . Paroxysmal atrial fibrillation ([place])   . Ventricular fibrillation ([place]) 02/09/2016  . Aortic aneurysm without rupture ([place]) 02/09/2016  . Aortic insufficiency 02/09/2016  . HCAP (healthcare-associated pneumonia)   . Cardiac arrest ([place]) 02/01/2016  . Acute on chronic systolic heart failure ([place])   . Acute encephalopathy   . Thyroid activity decreased   . Acute hypoxemic respiratory failure ([place])   . Acute respiratory failure ([place])   . Encounter for central line placement   . Arrhythmia   . Altered mental status   . CKD (chronic kidney disease), stage IV ([place]) 08/06/2013    Class: Chronic  . Long term (current) use of anticoagulants 08/06/2013    Class: Chronic  . Special screening for malignant neoplasms, colon 04/11/2013  . Colon cancer screening 03/04/2013  . Chronic anticoagulation 03/04/2013  . Atrial flutter ([place]) 03/18/2012    Class: Acute  . Chronic systolic heart failure ([place]) 02/13/2012    Class: Acute  . Hypertension, accelerated 02/13/2012  . Aortic valve regurgitation, acquired 02/13/2012  . Aortic root enlargement ([place]) 02/13/2012  . Hyperlipidemia 02/13/2012  . Left bundle branch block 02/13/2012  . Obesity (BMI 30-39.9) 02/13/2012    Eric [name], Eric [name] 08/09/2016, 4:58 PM  [facility] [address] [place], [place], [address] Phone: [phone]   Fax:  [phone]  Name: Eric Lachney  MD MRN: [id number] Date of Birth: 1950/11/22

## 2016-08-11 ENCOUNTER — Encounter: Payer: Self-pay | Admitting: Physical Therapy

## 2016-08-11 ENCOUNTER — Ambulatory Visit: Payer: BC Managed Care – PPO | Admitting: Physical Therapy

## 2016-08-11 DIAGNOSIS — M25561 Pain in right knee: Secondary | ICD-10-CM

## 2016-08-11 DIAGNOSIS — M6281 Muscle weakness (generalized): Secondary | ICD-10-CM | POA: Diagnosis not present

## 2016-08-11 DIAGNOSIS — R2689 Other abnormalities of gait and mobility: Secondary | ICD-10-CM

## 2016-08-11 NOTE — Therapy (Signed)
[facility] [address] [place], [place], [address] Phone: [phone]   Fax:  [phone]  Physical Therapy Treatment  Patient Details  Name: Eric Beckles  MD MRN: [id number] Date of Birth: 08-18-1951 Referring Provider: Dr. Roe Coombs  Encounter Date: 08/11/2016      PT End of Session - 08/11/16 1025    Visit Number 33   Date for PT Re-Evaluation 08/26/16   Authorization Type BCBS   PT Start Time 1016   PT Stop Time 1100   PT Time Calculation (min) 44 min   Activity Tolerance Patient tolerated treatment well   Behavior During Therapy [facility] for tasks assessed/performed      Past Medical History:  Diagnosis Date  . CHF (congestive heart failure) ([place])   . Chronic kidney disease    kidney fx studies increased   . Chronic lower back pain   . Claustrophobia   . Dysrhythmia    "palpitations"  . Exertional dyspnea 01/2012  . Heart murmur   . Hypertension   . Hypothyroidism   . Migraine 02/13/12   "opthalmic"  . Varicose vein of leg    right    Past Surgical History:  Procedure Laterality Date  . CARDIAC CATHETERIZATION N/A 02/17/2016   Procedure: Left Heart Cath and Coronary Angiography;  Surgeon: Jettie Booze, MD;  Location: [place] [facility];  Service: Cardiovascular;  Laterality: N/A;  . CARDIOVERSION  03/22/2012   Procedure: CARDIOVERSION;  Surgeon: Candee Furbish, MD;  Location: [facility] ENDOSCOPY;  Service: Cardiovascular;  Laterality: N/A;  . CARDIOVERSION  04/19/2012   Procedure: CARDIOVERSION;  Surgeon: Sinclair Grooms, MD;  Location: [place];  Service: Cardiovascular;  Laterality: N/A;  . COLONOSCOPY N/A 04/11/2013   Procedure: COLONOSCOPY;  Surgeon: Inda Castle, MD;  Location: [facility] ENDOSCOPY;  Service: Endoscopy;  Laterality: N/A;  . COLONOSCOPY N/A 04/11/2013   Procedure: COLONOSCOPY;  Surgeon: Inda Castle, MD;  Location: [facility] ENDOSCOPY;  Service: Endoscopy;  Laterality: N/A;  . EP  IMPLANTABLE DEVICE N/A 02/17/2016   Procedure: BiV ICD Insertion CRT-D;  Surgeon: Evans Lance, MD;  Location: [place] [facility];  Service: Cardiovascular;  Laterality: N/A;  . FINGER SURGERY  2012   "4th digit right hand; thumb on left hand"  . RADIOLOGY WITH ANESTHESIA N/A 02/11/2016   Procedure: MRI OF THE BRAIN WITHOUT CONTRAST, LUMBAR WITHOUT CONTRAST;  Surgeon: Medication Radiologist, MD;  Location: [place];  Service: Radiology;  Laterality: N/A;  DR. WOOD/MRI  . Skin melanocytoma excision  2012   "above left clavicle"  . TEE WITHOUT CARDIOVERSION  03/22/2012   Procedure: TRANSESOPHAGEAL ECHOCARDIOGRAM (TEE);  Surgeon: Candee Furbish, MD;  Location: [facility]OSCOPY;  Service: Cardiovascular;  Laterality: N/A;  . TEE WITHOUT CARDIOVERSION N/A 02/08/2016   Procedure: TRANSESOPHAGEAL ECHOCARDIOGRAM (TEE);  Surgeon: Lelon Perla, MD;  Location: [facility] ENDOSCOPY;  Service: Cardiovascular;  Laterality: N/A;    There were no vitals filed for this visit.      Subjective Assessment - 08/11/16 1026    Subjective Knee was very tight after last session: plans on practicing more driving this afternoon.   Currently in Pain? Yes   Pain Score 4    Pain Location Knee   Pain Orientation Right   Pain Descriptors / Indicators Tightness   Multiple Pain Sites No                         [facility]  Adult PT Treatment/Exercise - 08/11/16 0001      Knee/Hip Exercises: Aerobic   Stationary Bike L3 x 10 min     Knee/Hip Exercises: Machines for Strengthening   Cybex Knee Flexion 2 plates 2x 10   Total Gym Leg Press Seat 7:Bil 90# 3x10, right leg 50# 10x2     Knee/Hip Exercises: Standing   Forward Step Up Right;2 sets;10 reps;Hand Hold: 1;Step Height: 6"   Walking with Sports Cord 4 directions 25# 10x each                  PT Short Term Goals - 07/10/16 1616      PT SHORT TERM GOAL #3   Title right knee extension AROM in sitting -30 degress due to increase strength   Time 4    Period Weeks   Status On-going     PT SHORT TERM GOAL #4   Title pain in right leg decreased >/= 25%    Time 4   Period Weeks   Status Achieved     PT SHORT TERM GOAL #5   Title able to get in and out of car without helping his right leg due to increased strength   Time 4   Period Weeks   Status Achieved           PT Long Term Goals - 08/04/16 1033      PT LONG TERM GOAL #1   Title independent with HEP and understand how to progress himself   Time 8   Period Weeks   Status On-going     PT LONG TERM GOAL #2   Title able to stand for 25 min with pain decreased >/= 50% so he is able to cook a meal.   Time 8   Period Weeks   Status Achieved     PT LONG TERM GOAL #3   Title going up and down stairs with step to step with a cane   Time 8   Period Weeks   Status Achieved     PT LONG TERM GOAL #4   Title sit on stool and scoot around office to assess patients   Time 8   Period Weeks   Status Achieved     PT LONG TERM GOAL #5   Title return to household chores due to lower extremity strength >/= 4/5   Period Weeks   Status On-going     PT LONG TERM GOAL #6   Title return to driving due to increased right lower extremity strength >/= 4/5   Time 8   Period Weeks   Status On-going     PT LONG TERM GOAL #7   Title pain decreased >/= 50% in right knee   Time 8   Period Weeks   Status On-going               Plan - 08/11/16 1025    Clinical Impression Statement Pt continues to practice driving on his own, not quite independent. Much improvement in quality of step ups. Increased weight on the leg curl today.  Uses cane in the home 25% or less, uses it on the stairs mainly.    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   PT Duration Other (comment)   PT Treatment/Interventions ADLs/Self Care Home Management;Cryotherapy;Electrical Stimulation;Functional mobility  training;Stair training;Gait training;Ultrasound;Moist Heat;Therapeutic activities;Therapeutic exercise;Balance training;Neuromuscular re-education;Patient/family education;Passive range of motion;Manual techniques;Energy conservation;Other (comment)   PT  Next Visit Plan Continue with resisted walking, leg press, try stepping over objects without device   Consulted and Agree with Plan of Care --      Patient will benefit from skilled therapeutic intervention in order to improve the following deficits and impairments:  Abnormal gait, Difficulty walking, Decreased endurance, Pain, Decreased activity tolerance, Decreased balance, Decreased strength, Decreased mobility  Visit Diagnosis: Muscle weakness (generalized)  Other abnormalities of gait and mobility  Right knee pain, unspecified chronicity     Problem List Patient Active Problem List   Diagnosis Date Noted  . Long term current use of amiodarone 08/07/2016  . Right ankle pain   . Pain   . Tibial pain   . Acute idiopathic gout of right ankle   . Right knee pain   . Diabetes mellitus type 2 in obese ([place])   . Labile blood pressure   . Acute blood loss anemia   . Diabetes mellitus type 2 in nonobese ([facility])   . Debility 02/22/2016  . 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 Weakness of both lower extremities   . Lower extremity weakness   . Pneumonia   . Acute lumbar back pain   . Non-traumatic rhabdomyolysis   . Retroperitoneal bleed   . Back pain   . Leg weakness, bilateral   . Paroxysmal atrial fibrillation ([place])   . Ventricular fibrillation ([place]) 02/09/2016  . Aortic aneurysm without rupture ([place]) 02/09/2016  . Aortic insufficiency 02/09/2016  . HCAP (healthcare-associated pneumonia)   . Cardiac arrest ([place]) 02/01/2016  . Acute on chronic systolic heart failure ([place])   . Acute encephalopathy   . Thyroid activity decreased   . Acute hypoxemic respiratory failure ([place])   . Acute respiratory failure  ([place])   . Encounter for central line placement   . Arrhythmia   . Altered mental status   . CKD (chronic kidney disease), stage IV ([place]) 08/06/2013    Class: Chronic  . Long term (current) use of anticoagulants 08/06/2013    Class: Chronic  . Special screening for malignant neoplasms, colon 04/11/2013  . Colon cancer screening 03/04/2013  . Chronic anticoagulation 03/04/2013  . Atrial flutter ([place]) 03/18/2012    Class: Acute  . Chronic systolic heart failure ([place]) 02/13/2012    Class: Acute  . Hypertension, accelerated 02/13/2012  . Aortic valve regurgitation, acquired 02/13/2012  . Aortic root enlargement ([place]) 02/13/2012  . Hyperlipidemia 02/13/2012  . Left bundle branch block 02/13/2012  . Obesity (BMI 30-39.9) 02/13/2012    Darnesha Diloreto, PTA 08/11/2016, 10:52 AM  [facility] [address] [place], [place], [address] Phone: [phone]   Fax:  [phone]  Name: Eric Clausing  MD MRN: [id number] Date of Birth: 09-07-1951

## 2016-08-14 ENCOUNTER — Encounter: Payer: Self-pay | Admitting: Physical Therapy

## 2016-08-14 ENCOUNTER — Ambulatory Visit: Payer: BC Managed Care – PPO | Admitting: Physical Therapy

## 2016-08-14 DIAGNOSIS — M6281 Muscle weakness (generalized): Secondary | ICD-10-CM | POA: Diagnosis not present

## 2016-08-14 NOTE — Therapy (Signed)
[facility] [address] [place], [place], [address] Phone: [phone]   Fax:  [phone]  Physical Therapy Treatment  Patient Details  Name: Eric Nikolai  MD MRN: [id number] Date of Birth: Jan 31, 1951 Referring Provider: Dr. Roe Coombs  Encounter Date: 08/14/2016      PT End of Session - 08/14/16 1532    Visit Number 34   Date for PT Re-Evaluation 08/26/16   Authorization Type BCBS   PT Start Time 1451   PT Stop Time 1533   PT Time Calculation (min) 42 min   Activity Tolerance Patient tolerated treatment well   Behavior During Therapy [facility] for tasks assessed/performed      Past Medical History:  Diagnosis Date  . CHF (congestive heart failure) ([place])   . Chronic kidney disease    kidney fx studies increased   . Chronic lower back pain   . Claustrophobia   . Dysrhythmia    "palpitations"  . Exertional dyspnea 01/2012  . Heart murmur   . Hypertension   . Hypothyroidism   . Migraine 02/13/12   "opthalmic"  . Varicose vein of leg    right    Past Surgical History:  Procedure Laterality Date  . CARDIAC CATHETERIZATION N/A 02/17/2016   Procedure: Left Heart Cath and Coronary Angiography;  Surgeon: Jettie Booze, MD;  Location: [place] [facility];  Service: Cardiovascular;  Laterality: N/A;  . CARDIOVERSION  03/22/2012   Procedure: CARDIOVERSION;  Surgeon: Candee Furbish, MD;  Location: [facility] ENDOSCOPY;  Service: Cardiovascular;  Laterality: N/A;  . CARDIOVERSION  04/19/2012   Procedure: CARDIOVERSION;  Surgeon: Sinclair Grooms, MD;  Location: [place];  Service: Cardiovascular;  Laterality: N/A;  . COLONOSCOPY N/A 04/11/2013   Procedure: COLONOSCOPY;  Surgeon: Inda Castle, MD;  Location: [facility] ENDOSCOPY;  Service: Endoscopy;  Laterality: N/A;  . COLONOSCOPY N/A 04/11/2013   Procedure: COLONOSCOPY;  Surgeon: Inda Castle, MD;  Location: [facility] ENDOSCOPY;  Service: Endoscopy;  Laterality: N/A;  . EP  IMPLANTABLE DEVICE N/A 02/17/2016   Procedure: BiV ICD Insertion CRT-D;  Surgeon: Evans Lance, MD;  Location: [place] [facility];  Service: Cardiovascular;  Laterality: N/A;  . FINGER SURGERY  2012   "4th digit right hand; thumb on left hand"  . RADIOLOGY WITH ANESTHESIA N/A 02/11/2016   Procedure: MRI OF THE BRAIN WITHOUT CONTRAST, LUMBAR WITHOUT CONTRAST;  Surgeon: Medication Radiologist, MD;  Location: [place];  Service: Radiology;  Laterality: N/A;  DR. WOOD/MRI  . Skin melanocytoma excision  2012   "above left clavicle"  . TEE WITHOUT CARDIOVERSION  03/22/2012   Procedure: TRANSESOPHAGEAL ECHOCARDIOGRAM (TEE);  Surgeon: Candee Furbish, MD;  Location: [facility] ENDOSCOPY;  Service: Cardiovascular;  Laterality: N/A;  . TEE WITHOUT CARDIOVERSION N/A 02/08/2016   Procedure: TRANSESOPHAGEAL ECHOCARDIOGRAM (TEE);  Surgeon: Lelon Perla, MD;  Location: [facility] ENDOSCOPY;  Service: Cardiovascular;  Laterality: N/A;    There were no vitals filed for this visit.      Subjective Assessment - 08/14/16 1454    Subjective Has not practiced any more driving since last week. No new complaints today.   Currently in Pain? Yes   Pain Score 4    Pain Location Knee   Pain Orientation Right   Pain Descriptors / Indicators Tightness   Aggravating Factors  Stiffness in the morning   Pain Relieving Factors rest, spinning the bike.    Multiple Pain Sites No  [place] Adult PT Treatment/Exercise - 08/14/16 0001      Knee/Hip Exercises: Aerobic   Stationary Bike L3 x 10 min     Knee/Hip Exercises: Machines for Strengthening   Cybex Knee Flexion 2 plates 2x 10   Total Gym Leg Press Seat 7: bil 95# 3x10, 50# 3x10,      Knee/Hip Exercises: Standing   Forward Step Up Right;2 sets;10 reps;Hand Hold: 1;Step Height: 6"   Walking with Sports Cord 4 directions 30# 5x each                  PT Short Term Goals - 07/10/16 1616      PT SHORT TERM GOAL #3   Title right  knee extension AROM in sitting -30 degress due to increase strength   Time 4   Period Weeks   Status On-going     PT SHORT TERM GOAL #4   Title pain in right leg decreased >/= 25%    Time 4   Period Weeks   Status Achieved     PT SHORT TERM GOAL #5   Title able to get in and out of car without helping his right leg due to increased strength   Time 4   Period Weeks   Status Achieved           PT Long Term Goals - 08/14/16 1510      PT LONG TERM GOAL #1   Title independent with HEP and understand how to progress himself   Period Weeks   Status On-going     PT LONG TERM GOAL #6   Title return to driving due to increased right lower extremity strength >/= 4/5   Time 8   Period Weeks   Status On-going  Only practiced 1x     PT LONG TERM GOAL #7   Title pain decreased >/= 50% in right knee   Time 8   Period Weeks   Status On-going  25%                Plan - 08/14/16 1519    Clinical Impression Statement Pt has not practiced driving since last report. Able to increase weight on leg press today. Pt reports he cannot go down stairs very well and wants to wean off his cane on all terrains.    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      Patient will benefit from skilled therapeutic intervention in order to improve the  following deficits and impairments:  Abnormal gait, Difficulty walking, Decreased endurance, Pain, Decreased activity tolerance, Decreased balance, Decreased strength, Decreased mobility  Visit Diagnosis: Muscle weakness (generalized)     Problem List Patient Active Problem List   Diagnosis Date Noted  . Long term current use of amiodarone 08/07/2016  . Right ankle pain   . Pain   . Tibial pain   . Acute idiopathic gout of right ankle   . Right knee pain   . Diabetes mellitus type 2 in obese ([place])   . Labile blood pressure   . Acute blood loss  anemia   . Diabetes mellitus type 2 in nonobese ([facility])   . Debility 02/22/2016  . 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 Weakness of both lower extremities   . Lower extremity weakness   . Pneumonia   . Acute lumbar back pain   . Non-traumatic rhabdomyolysis   . Retroperitoneal bleed   . Back pain   . Leg weakness, bilateral   . Paroxysmal atrial fibrillation ([place])   . Ventricular fibrillation ([place]) 02/09/2016  . Aortic aneurysm without rupture ([place]) 02/09/2016  . Aortic insufficiency 02/09/2016  . HCAP (healthcare-associated pneumonia)   . Cardiac arrest ([place]) 02/01/2016  . Acute on chronic systolic heart failure ([place])   . Acute encephalopathy   . Thyroid activity decreased   . Acute hypoxemic respiratory failure ([place])   . Acute respiratory failure ([place])   . Encounter for central line placement   . Arrhythmia   . Altered mental status   . CKD (chronic kidney disease), stage IV ([place]) 08/06/2013    Class: Chronic  . Long term (current) use of anticoagulants 08/06/2013    Class: Chronic  . Special screening for malignant neoplasms, colon 04/11/2013  . Colon cancer screening 03/04/2013  . Chronic anticoagulation 03/04/2013  . Atrial flutter ([place]) 03/18/2012    Class: Acute  . Chronic systolic heart failure ([place]) 02/13/2012    Class: Acute  . Hypertension, accelerated 02/13/2012  . Aortic valve  regurgitation, acquired 02/13/2012  . Aortic root enlargement ([place]) 02/13/2012  . Hyperlipidemia 02/13/2012  . Left bundle branch block 02/13/2012  . Obesity (BMI 30-39.9) 02/13/2012    Kirsty Monjaraz, PTA 08/14/2016, 4:25 PM  [facility] [address] [place], [place], [address] Phone: [phone]   Fax:  [phone]  Name: Fredrich Cory  MD MRN: [id number] Date of Birth: 13-Mar-1951

## 2016-08-21 ENCOUNTER — Encounter: Payer: BC Managed Care – PPO | Admitting: Physical Therapy

## 2016-08-23 ENCOUNTER — Ambulatory Visit: Payer: BC Managed Care – PPO | Admitting: Physical Therapy

## 2016-08-23 ENCOUNTER — Encounter: Payer: Self-pay | Admitting: Physical Therapy

## 2016-08-23 DIAGNOSIS — M6281 Muscle weakness (generalized): Secondary | ICD-10-CM | POA: Diagnosis not present

## 2016-08-23 NOTE — Therapy (Signed)
[facility] [address] [place], [place], [address] Phone: [phone]   Fax:  [phone]  Physical Therapy Treatment  Patient Details  Name: Eric Vowels  MD MRN: [id number] Date of Birth: [birth year] Referring Provider: Dr. Roe Coombs  Encounter Date: 08/23/2016      PT End of Session - 08/23/16 1624    Visit Number 35   Date for PT Re-Evaluation 08/26/16   Authorization Type BCBS   PT Start Time 1245   PT Stop Time 1705   PT Time Calculation (min) 48 min   Activity Tolerance Patient tolerated treatment well   Behavior During Therapy [facility] for tasks assessed/performed      Past Medical History:  Diagnosis Date  . CHF (congestive heart failure) ([place])   . Chronic kidney disease    kidney fx studies increased   . Chronic lower back pain   . Claustrophobia   . Dysrhythmia    "palpitations"  . Exertional dyspnea 01/2012  . Heart murmur   . Hypertension   . Hypothyroidism   . Migraine 02/13/12   "opthalmic"  . Varicose vein of leg    right    Past Surgical History:  Procedure Laterality Date  . CARDIAC CATHETERIZATION N/A 02/17/2016   Procedure: Left Heart Cath and Coronary Angiography;  Surgeon: Jettie Booze, MD;  Location: [place] CV [facility];  Service: Cardiovascular;  Laterality: N/A;  . CARDIOVERSION  03/22/2012   Procedure: CARDIOVERSION;  Surgeon: Candee Furbish, MD;  Location: [facility] ENDOSCOPY;  Service: Cardiovascular;  Laterality: N/A;  . CARDIOVERSION  04/19/2012   Procedure: CARDIOVERSION;  Surgeon: Sinclair Grooms, MD;  Location: [place];  Service: Cardiovascular;  Laterality: N/A;  . COLONOSCOPY N/A 04/11/2013   Procedure: COLONOSCOPY;  Surgeon: Inda Castle, MD;  Location: [facility] ENDOSCOPY;  Service: Endoscopy;  Laterality: N/A;  . COLONOSCOPY N/A 04/11/2013   Procedure: COLONOSCOPY;  Surgeon: Inda Castle, MD;  Location: [facility] ENDOSCOPY;  Service: Endoscopy;  Laterality: N/A;  . EP  IMPLANTABLE DEVICE N/A 02/17/2016   Procedure: BiV ICD Insertion CRT-D;  Surgeon: Evans Lance, MD;  Location: [place] [facility];  Service: Cardiovascular;  Laterality: N/A;  . FINGER SURGERY  2012   "4th digit right hand; thumb on left hand"  . RADIOLOGY WITH ANESTHESIA N/A 02/11/2016   Procedure: MRI OF THE BRAIN WITHOUT CONTRAST, LUMBAR WITHOUT CONTRAST;  Surgeon: Medication Radiologist, MD;  Location: [place];  Service: Radiology;  Laterality: N/A;  DR. WOOD/MRI  . Skin melanocytoma excision  2012   "above left clavicle"  . TEE WITHOUT CARDIOVERSION  03/22/2012   Procedure: TRANSESOPHAGEAL ECHOCARDIOGRAM (TEE);  Surgeon: Candee Furbish, MD;  Location: [facility] ENDOSCOPY;  Service: Cardiovascular;  Laterality: N/A;  . TEE WITHOUT CARDIOVERSION N/A 02/08/2016   Procedure: TRANSESOPHAGEAL ECHOCARDIOGRAM (TEE);  Surgeon: Lelon Perla, MD;  Location: [facility] ENDOSCOPY;  Service: Cardiovascular;  Laterality: N/A;    There were no vitals filed for this visit.      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 Feels like this is going well, moving from gas to brake and controlling the acceleration. Went to [employer] a few times since last session.    Currently in Pain? Yes   Pain Score 4    Pain Location Knee   Pain Orientation Right   Pain Descriptors / Indicators Dull;Aching;Tightness   Aggravating Factors  After a lot of standing   Pain Relieving Factors  Rest, spinning the bike   Multiple Pain Sites No                         [facility] Adult PT Treatment/Exercise - 08/23/16 0001      Knee/Hip Exercises: Aerobic   Stationary Bike L4 x 10 min     Knee/Hip Exercises: Machines for Strengthening   Cybex Knee Flexion 3 plates 2x15    Total Gym Leg Press Seat 7: bil 95# 3x10, 50# 3x10,      Knee/Hip Exercises: Standing   Forward Step Up Right;2 sets;10 reps;Hand Hold: 1;Step Height: 6"   SLS On mini tamp 2x 1 min    VC to contract Rt quad   Other  Standing Knee Exercises ladder drills with no device in various patterns                  PT Short Term Goals - 07/10/16 1616      PT SHORT TERM GOAL #3   Title right knee extension AROM in sitting -30 degress due to increase strength   Time 4   Period Weeks   Status On-going     PT SHORT TERM GOAL #4   Title pain in right leg decreased >/= 25%    Time 4   Period Weeks   Status Achieved     PT SHORT TERM GOAL #5   Title able to get in and out of car without helping his right leg due to increased strength   Time 4   Period Weeks   Status Achieved           PT Long Term Goals - 08/14/16 1510      PT LONG TERM GOAL #1   Title independent with HEP and understand how to progress himself   Period Weeks   Status On-going     PT LONG TERM GOAL #6   Title return to driving due to increased right lower extremity strength >/= 4/5   Time 8   Period Weeks   Status On-going  Only practiced 1x     PT LONG TERM GOAL #7   Title pain decreased >/= 50% in right knee   Time 8   Period Weeks   Status On-going  25%                Plan - 08/23/16 1625    Clinical Impression Statement Practicing his driving in large parking lots currently. Began single leg stance on mini tramp which was very fatiguing to pt's RT leg. Ladder drills also introduced which pt did very well, no instability or fear/anxiety with movement.    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   PT Frequency 3x / week   PT Duration Other (comment)   PT Treatment/Interventions ADLs/Self Care Home Management;Cryotherapy;Electrical Stimulation;Functional mobility training;Stair training;Gait training;Ultrasound;Moist Heat;Therapeutic activities;Therapeutic exercise;Balance training;Neuromuscular re-education;Patient/family education;Passive range of motion;Manual techniques;Energy conservation;Other  (comment)   PT Next Visit Plan Reassessment next: pt would like to continue with new goals that advances his functional ability.    Consulted and Agree with Plan of Care Patient      Patient will benefit from skilled therapeutic intervention in order to improve the following deficits and impairments:  Abnormal gait, Difficulty walking, Decreased endurance, Pain, Decreased activity tolerance, Decreased balance, Decreased strength, Decreased mobility  Visit Diagnosis: Muscle weakness (generalized)     Problem List Patient Active  Problem List   Diagnosis Date Noted  . Long term current use of amiodarone 08/07/2016  . Right ankle pain   . Pain   . Tibial pain   . Acute idiopathic gout of right ankle   . Right knee pain   . Diabetes mellitus type 2 in obese ([place])   . Labile blood pressure   . Acute blood loss anemia   . Diabetes mellitus type 2 in nonobese ([facility])   . Debility 02/22/2016  . 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 Weakness of both lower extremities   . Lower extremity weakness   . Pneumonia   . Acute lumbar back pain   . Non-traumatic rhabdomyolysis   . Retroperitoneal bleed   . Back pain   . Leg weakness, bilateral   . Paroxysmal atrial fibrillation ([place])   . Ventricular fibrillation ([place]) 02/09/2016  . Aortic aneurysm without rupture ([place]) 02/09/2016  . Aortic insufficiency 02/09/2016  . HCAP (healthcare-associated pneumonia)   . Cardiac arrest ([place]) 02/01/2016  . Acute on chronic systolic heart failure ([place])   . Acute encephalopathy   . Thyroid activity decreased   . Acute hypoxemic respiratory failure ([place])   . Acute respiratory failure ([place])   . Encounter for central line placement   . Arrhythmia   . Altered mental status   . CKD (chronic kidney disease), stage IV ([place]) 08/06/2013    Class: Chronic  . Long term (current) use of anticoagulants 08/06/2013    Class: Chronic  . Special screening for malignant neoplasms, colon  04/11/2013  . Colon cancer screening 03/04/2013  . Chronic anticoagulation 03/04/2013  . Atrial flutter ([place]) 03/18/2012    Class: Acute  . Chronic systolic heart failure ([place]) 02/13/2012    Class: Acute  . Hypertension, accelerated 02/13/2012  . Aortic valve regurgitation, acquired 02/13/2012  . Aortic root enlargement ([place]) 02/13/2012  . Hyperlipidemia 02/13/2012  . Left bundle branch block 02/13/2012  . Obesity (BMI 30-39.9) 02/13/2012    Paytan Recine, PTA 08/23/2016, 4:55 PM  [facility] [address] [place], [place], [address] Phone: [phone]   Fax:  [phone]  Name: Soul Hackman  MD MRN: [id number] Date of Birth: Nov 26, 1950

## 2016-08-24 ENCOUNTER — Encounter: Payer: BC Managed Care – PPO | Admitting: *Deleted

## 2016-08-24 ENCOUNTER — Telehealth: Payer: Self-pay | Admitting: Cardiology

## 2016-08-24 NOTE — Telephone Encounter (Signed)
LM for pt to returncall

## 2016-08-25 ENCOUNTER — Ambulatory Visit: Payer: BC Managed Care – PPO | Attending: Internal Medicine | Admitting: Physical Therapy

## 2016-08-25 DIAGNOSIS — M6281 Muscle weakness (generalized): Secondary | ICD-10-CM | POA: Insufficient documentation

## 2016-08-25 DIAGNOSIS — M25561 Pain in right knee: Secondary | ICD-10-CM | POA: Diagnosis present

## 2016-08-25 DIAGNOSIS — R2689 Other abnormalities of gait and mobility: Secondary | ICD-10-CM | POA: Diagnosis present

## 2016-08-25 NOTE — Therapy (Signed)
[facility] [address] [place], [place], [address] Phone: [phone]   Fax:  [phone]  Physical Therapy Treatment/Recertification  Patient Details  Name: Eric Vandyke  MD MRN: [id number] Date of Birth: 23-Jul-1951 Referring Provider: Dr. Roe Coombs  Encounter Date: 08/25/2016      PT End of Session - 08/25/16 1237    Visit Number 36   Date for PT Re-Evaluation 10/20/16   Authorization Type BCBS   PT Start Time 0856   PT Stop Time 0950   PT Time Calculation (min) 54 min   Activity Tolerance Patient tolerated treatment well      Past Medical History:  Diagnosis Date  . CHF (congestive heart failure) ([place])   . Chronic kidney disease    kidney fx studies increased   . Chronic lower back pain   . Claustrophobia   . Dysrhythmia    "palpitations"  . Exertional dyspnea 01/2012  . Heart murmur   . Hypertension   . Hypothyroidism   . Migraine 02/13/12   "opthalmic"  . Varicose vein of leg    right    Past Surgical History:  Procedure Laterality Date  . CARDIAC CATHETERIZATION N/A 02/17/2016   Procedure: Left Heart Cath and Coronary Angiography;  Surgeon: Jettie Booze, MD;  Location: [place] [facility];  Service: Cardiovascular;  Laterality: N/A;  . CARDIOVERSION  03/22/2012   Procedure: CARDIOVERSION;  Surgeon: Candee Furbish, MD;  Location: [facility] ENDOSCOPY;  Service: Cardiovascular;  Laterality: N/A;  . CARDIOVERSION  04/19/2012   Procedure: CARDIOVERSION;  Surgeon: Sinclair Grooms, MD;  Location: [place];  Service: Cardiovascular;  Laterality: N/A;  . COLONOSCOPY N/A 04/11/2013   Procedure: COLONOSCOPY;  Surgeon: Inda Castle, MD;  [facility] ENDOSCOPY;  Service: Endoscopy;  Laterality: N/A;  . COLONOSCOPY N/A 04/11/2013   Procedure: COLONOSCOPY;  Surgeon: Inda Castle, MD;  Location: [facility] ENDOSCOPY;  Service: Endoscopy;  Laterality: N/A;  . EP IMPLANTABLE DEVICE N/A 02/17/2016   Procedure: BiV  ICD Insertion CRT-D;  Surgeon: Evans Lance, MD;  Location: [place] [facility];  Service: Cardiovascular;  Laterality: N/A;  . FINGER SURGERY  2012   "4th digit right hand; thumb on left hand"  . RADIOLOGY WITH ANESTHESIA N/A 02/11/2016   Procedure: MRI OF THE BRAIN WITHOUT CONTRAST, LUMBAR WITHOUT CONTRAST;  Surgeon: Medication Radiologist, MD;  Location: [place];  Service: Radiology;  Laterality: N/A;  DR. WOOD/MRI  . Skin melanocytoma excision  2012   "above left clavicle"  . TEE WITHOUT CARDIOVERSION  03/22/2012   Procedure: TRANSESOPHAGEAL ECHOCARDIOGRAM (TEE);  Surgeon: Candee Furbish, MD;  Location: [facility] ENDOSCOPY;  Service: Cardiovascular;  Laterality: N/A;  . TEE WITHOUT CARDIOVERSION N/A 02/08/2016   Procedure: TRANSESOPHAGEAL ECHOCARDIOGRAM (TEE);  Surgeon: Lelon Perla, MD;  Location: [facility] ENDOSCOPY;  Service: Cardiovascular;  Laterality: N/A;    There were no vitals filed for this visit.      Subjective Assessment - 08/25/16 0859    Subjective Arrives 11 min late.  States work is going pretty well.  Drove a couple of times and I thought I did well but my wife won't let me do it much.  Walking with SPC full time.  States he does well on indoor and flat outdoor surfaces but is unable to ambulate the incline/decline to go to the mailbox.  Patient states is unable to descend  steps reciprocally and can only do 3-4 steps ascending reciprocally.  How long can you walk comfortably? 30 min    Currently in Pain? Yes   Pain Score 2    Pain Location Knee   Pain Orientation Right   Pain Type Chronic pain   Aggravating Factors  walking > 30 min            [facility] PT Assessment - 08/25/16 0001      Observation/Other Assessments   Focus on Therapeutic Outcomes (FOTO)  50% limitation per therapist assessment of limitations     Posture/Postural Control   Posture Comments right quad atrophy     AROM   Right Knee Extension -20   Right Knee Flexion 130     Strength   Right Hip  Flexion 4+/5   Right Hip Extension 4+/5   Right Hip ABduction 4-/5   Right Knee Flexion 4+/5   Right Knee Extension 2+/5  In sitting pt cannot extend the knee with full ROM                     [facility] Adult PT Treatment/Exercise - 08/25/16 0001      Knee/Hip Exercises: Aerobic   Stationary Bike L4 x 10 min   Nustep L4 x 8 min  Concurent review of status     Knee/Hip Exercises: Machines for Strengthening   Total Gym Leg Press Seat 7: bil 95# 3x10, 50# 3x10,      Knee/Hip Exercises: Standing   Forward Step Up Right;2 sets;10 reps;Hand Hold: 1;Step Height: 6"   Forward Step Up Limitations step taps with left on next step   Step Down Left;1 set;Hand Hold: 2;Step Height: 2"       Close supervision and at time contact guard assist in standing for safety.             PT Short Term Goals - 08/25/16 1446      PT SHORT TERM GOAL #1   Title independent with initial HEP    Status Achieved     PT SHORT TERM GOAL #2   Title be able to go to the gym to do the bike or elliptical for improving strength   Status Achieved     PT SHORT TERM GOAL #3   Title right knee extension AROM in sitting -30 degress due to increase strength   Status Achieved     PT SHORT TERM GOAL #4   Title pain in right leg decreased >/= 25%    Status Achieved     PT SHORT TERM GOAL #5   Title able to get in and out of car without helping his right leg due to increased strength   Status Achieved           PT Long Term Goals - 08/25/16 0912      PT LONG TERM GOAL #1   Title independent with HEP and basic gym program including understanding of  how to progress himself   Time 8   Period Weeks   Status Revised     PT LONG TERM GOAL #2   Title able to stand for 25 min with pain decreased >/= 50% so he is able to cook a meal.   Status Achieved     PT LONG TERM GOAL #3   Title going up and down stairs with step to step with a cane   Status Achieved     PT LONG TERM GOAL #4   Title  sit on stool and scoot around office to assess  patients   Status Achieved     PT LONG TERM GOAL #5   Title return to household chores due to lower extremity strength >/= 4/5   Time 8   Period Weeks   Status On-going     PT LONG TERM GOAL #6   Title return to driving due to increased right lower extremity strength >/= 4-/5   Time 8   Period Weeks   Status Revised     PT LONG TERM GOAL #7   Title pain decreased >/= 50% in right knee   Status Achieved     PT LONG TERM GOAL #8   Title The patient will be able to ambulate with SPC on the incline (and decline) of his driveway to check his mailbox   Time 8   Period Weeks   Status New     PT LONG TERM GOAL  #9   TITLE The patient will be able to ascend and descend full  flight of steps at home reciprocally with cane and 1 railing   Time 8   Period Weeks   Status New     PT LONG TERM GOAL  #10   TITLE The patient will have improved right LE strength needed to get in/out of the bathtub using UE assist /bars as needed   Time 8   Period Weeks   Status New     PT LONG TERM GOAL  #11   TITLE The patient will have the right hip strength to 4+/5 needed to elevate and lower stool in the operating room   Time 8   Period Weeks   Status New     PT LONG TERM GOAL  #12   TITLE The patient will be able to ambulate 5 minutes on the treadmill as needed to initiate cardiac rehab program   Time 8   Period Weeks   Status New               Plan - 08/25/16 1238    Clinical Impression Statement The patient has made good progress with physical therapy.  Although he continues to have decreased right quad muscle motor control (lacks 20 degrees active extension), he has had some improvement in control to progress off the walker to a single point cane full time for level surfaces.  He is able to ambulate about 30 minutes before knee pain limits him.  He has not been able to walk up and the hill in his driveway.  He has difficulty with the  flight of stairs in his house and is unable to descend them reciprocally. He can ascend 3-4 steps reciprocally before muscle fatigue sets in.  He has not fully returned to driving secondary to his continued lack of motor control.  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  He reports difficulty getting up and down from his seat repetitively during the work day.  In the operating room, he lacks the strength to use to foot pedal (hip flexion/extension) to adjust the stools he uses.  The patient also has functional goal of being able to get in/out of the bathtub again.  Upon completion of PT, his cardiologist has recommended he do cardiac rehab.  The patient will need the muscular strength in his right LE to safely walk on the treadmill.  The patient has made steady progress.  He would benefit from further PT for strengthening, neuromuscular re-education, and functional reactivation needed to return to prior level of function.  The patient requires skilled PT for appropriate progression with his impairments, safety and monitoring of response to treatment.     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   PT Frequency 2x / week   PT Duration 8 weeks   PT Treatment/Interventions ADLs/Self Care Home Management;Cryotherapy;Electrical Stimulation;Functional mobility training;Stair training;Gait training;Ultrasound;Moist Heat;Therapeutic activities;Therapeutic exercise;Balance training;Neuromuscular re-education;Patient/family education;Passive range of motion;Manual techniques;Energy conservation;Other (comment)   PT Next Visit Plan try treadmill for patient goal to go to cardiac rehab after PT completed (slow speed, short duration);  gait on incline/decline;  quad strengthening for stairs;  hip flexion/extension right strengthening to simulate foot pedal in OR;  sit to stand      Patient will benefit from  skilled therapeutic intervention in order to improve the following deficits and impairments:  Abnormal gait, Difficulty walking, Decreased endurance, Pain, Decreased activity tolerance, Decreased balance, Decreased strength, Decreased mobility  Visit Diagnosis: Muscle weakness (generalized) - Plan: PT plan of care cert/re-cert  Other abnormalities of gait and mobility - Plan: PT plan of care cert/re-cert  Right knee pain, unspecified chronicity - Plan: PT plan of care cert/re-cert     Problem List Patient Active Problem List   Diagnosis Date Noted  . Long term current use of amiodarone 08/07/2016  . Right ankle pain   . Pain   . Tibial pain   . Acute idiopathic gout of right ankle   . Right knee pain   . Diabetes mellitus type 2 in obese ([place])   . Labile blood pressure   . Acute blood loss anemia   . Diabetes mellitus type 2 in nonobese ([facility])   . Debility 02/22/2016  . 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 Weakness of both lower extremities   . Lower extremity weakness   . Pneumonia   . Acute lumbar back pain   . Non-traumatic rhabdomyolysis   . Retroperitoneal bleed   . Back pain   . Leg weakness, bilateral   . Paroxysmal atrial fibrillation ([place])   . Ventricular fibrillation ([place]) 02/09/2016  . Aortic aneurysm without rupture ([place]) 02/09/2016  . Aortic insufficiency 02/09/2016  . HCAP (healthcare-associated pneumonia)   . Cardiac arrest ([place]) 02/01/2016  . Acute on chronic systolic heart failure ([place])   . Acute encephalopathy   . Thyroid activity decreased   . Acute hypoxemic respiratory failure ([place])   . Acute respiratory failure ([place])   . Encounter for central line placement   . Arrhythmia   . Altered mental status   . CKD (chronic kidney disease), stage IV ([place]) 08/06/2013    Class: Chronic  . Long term (current) use of anticoagulants 08/06/2013    Class: Chronic  . Special screening for malignant neoplasms, colon 04/11/2013  . Colon  cancer screening 03/04/2013  . Chronic anticoagulation 03/04/2013  . Atrial flutter ([place]) 03/18/2012    Class: Acute  . Chronic systolic heart failure ([place]) 02/13/2012    Class: Acute  . Hypertension, accelerated 02/13/2012  . Aortic valve regurgitation, acquired 02/13/2012  . Aortic root enlargement ([place]) 02/13/2012  . Hyperlipidemia 02/13/2012  . Left bundle branch block 02/13/2012  . Obesity (BMI 30-39.9) 02/13/2012   Ruben Im, PT 08/25/16 2:58 PM Phone: [phone] Fax: [phone]  Alvera Singh 08/25/2016, 2:57 PM  [facility] [address] [place], [place], [address] Phone: [phone]   Fax:  [phone]  Name: Eric Meinhardt  MD MRN: [id number] Date of Birth: [birth year]

## 2016-08-30 ENCOUNTER — Encounter: Payer: Self-pay | Admitting: Physical Therapy

## 2016-08-30 ENCOUNTER — Ambulatory Visit: Payer: BC Managed Care – PPO | Admitting: Physical Therapy

## 2016-08-30 DIAGNOSIS — M6281 Muscle weakness (generalized): Secondary | ICD-10-CM | POA: Diagnosis not present

## 2016-08-30 NOTE — Therapy (Signed)
[facility] [address], [place], [place], [address] Phone: [phone]   Fax:  [phone]  Physical Therapy Treatment  Patient Details  Name: Eric Bua  MD MRN: [id number] Date of Birth: November 15, 1950 Referring Provider: Dr. Roe Coombs  Encounter Date: 08/30/2016      PT End of Session - 08/30/16 1623    Visit Number 37   Date for PT Re-Evaluation 10/20/16   Authorization Type BCBS   PT Start Time 1622  8 min late with a bathroom stop before statrting PT   PT Stop Time 1700   PT Time Calculation (min) 38 min   Activity Tolerance Patient tolerated treatment well   Behavior During Therapy [facility] for tasks assessed/performed      Past Medical History:  Diagnosis Date  . CHF (congestive heart failure) ([place])   . Chronic kidney disease    kidney fx studies increased   . Chronic lower back pain   . Claustrophobia   . Dysrhythmia    "palpitations"  . Exertional dyspnea 01/2012  . Heart murmur   . Hypertension   . Hypothyroidism   . Migraine 02/13/12   "opthalmic"  . Varicose vein of leg    right    Past Surgical History:  Procedure Laterality Date  . CARDIAC CATHETERIZATION N/A 02/17/2016   Procedure: Left Heart Cath and Coronary Angiography;  Surgeon: Jettie Booze, MD;  Location: [place] CV [facility];  Service: Cardiovascular;  Laterality: N/A;  . CARDIOVERSION  03/22/2012   Procedure: CARDIOVERSION;  Surgeon: Candee Furbish, MD;  Location: [facility] ENDOSCOPY;  Service: Cardiovascular;  Laterality: N/A;  . CARDIOVERSION  04/19/2012   Procedure: CARDIOVERSION;  Surgeon: Sinclair Grooms, MD;  Location: [place];  Service: Cardiovascular;  Laterality: N/A;  . COLONOSCOPY N/A 04/11/2013   Procedure: COLONOSCOPY;  Surgeon: Inda Castle, MD;  Location: [facility] ENDOSCOPY;  Service: Endoscopy;  Laterality: N/A;  . COLONOSCOPY N/A 04/11/2013   Procedure: COLONOSCOPY;  Surgeon: Inda Castle, MD;  Location: [facility]  ENDOSCOPY;  Service: Endoscopy;  Laterality: N/A;  . EP IMPLANTABLE DEVICE N/A 02/17/2016   Procedure: BiV ICD Insertion CRT-D;  Surgeon: Evans Lance, MD;  Location: [place] CV LAB;  Service: Cardiovascular;  Laterality: N/A;  . FINGER SURGERY  2012   "4th digit right hand; thumb on left hand"  . RADIOLOGY WITH ANESTHESIA N/A 02/11/2016   Procedure: MRI OF THE BRAIN WITHOUT CONTRAST, LUMBAR WITHOUT CONTRAST;  Surgeon: Medication Radiologist, MD;  Location: [place];  Service: Radiology;  Laterality: N/A;  DR. WOOD/MRI  . Skin melanocytoma excision  2012   "above left clavicle"  . TEE WITHOUT CARDIOVERSION  03/22/2012   Procedure: TRANSESOPHAGEAL ECHOCARDIOGRAM (TEE);  Surgeon: Candee Furbish, MD;  Location: [facility] ENDOSCOPY;  Service: Cardiovascular;  Laterality: N/A;  . TEE WITHOUT CARDIOVERSION N/A 02/08/2016   Procedure: TRANSESOPHAGEAL ECHOCARDIOGRAM (TEE);  Surgeon: Lelon Perla, MD;  Location: [facility] ENDOSCOPY;  Service: Cardiovascular;  Laterality: N/A;    There were no vitals filed for this visit.      Subjective Assessment - 08/30/16 1624    Subjective Stood outside on the grass for about 30 minutes to supervise leaf blowing. Pt reports he did try a little leaf blowing himself.    Currently in Pain? No/denies   Pain Location Knee   Pain Descriptors / Indicators Tightness   Multiple Pain Sites No  [place] Adult PT Treatment/Exercise - 08/30/16 0001      Knee/Hip Exercises: Aerobic   Stationary Bike L4 x 6 min   Warm up, review of status   Tread Mill .8 mph x 3:30     Knee/Hip Exercises: Machines for Strengthening   Total Gym Leg Press Seat 7 Bil 95# 10x , 100# 10x 2 RTLE 55# 3x10     Knee/Hip Exercises: Standing   Hip Abduction Stengthening;Right;2 sets;10 reps   Step Down Left;2 sets;10 reps;Hand Hold: 2;Step Height: 2"   SLS --  On mini trramp: 3 x 1 min with UE support                  PT Short Term Goals - 08/25/16 1446       PT SHORT TERM GOAL #1   Title independent with initial HEP    Status Achieved     PT SHORT TERM GOAL #2   Title be able to go to the gym to do the bike or elliptical for improving strength   Status Achieved     PT SHORT TERM GOAL #3   Title right knee extension AROM in sitting -30 degress due to increase strength   Status Achieved     PT SHORT TERM GOAL #4   Title pain in right leg decreased >/= 25%    Status Achieved     PT SHORT TERM GOAL #5   Title able to get in and out of car without helping his right leg due to increased strength   Status Achieved           PT Long Term Goals - 08/25/16 0912      PT LONG TERM GOAL #1   Title independent with HEP and basic gym program including understanding of  how to progress himself   Time 8   Period Weeks   Status Revised     PT LONG TERM GOAL #2   Title able to stand for 25 min with pain decreased >/= 50% so he is able to cook a meal.   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   Status Achieved     PT LONG TERM GOAL #5   Title return to household chores due to lower extremity strength >/= 4/5   Time 8   Period Weeks   Status On-going     PT LONG TERM GOAL #6   Title return to driving due to increased right lower extremity strength >/= 4-/5   Time 8   Period Weeks   Status Revised     PT LONG TERM GOAL #7   Title pain decreased >/= 50% in right knee   Status Achieved     PT LONG TERM GOAL #8   Title The patient will be able to ambulate with SPC on the incline (and decline) of his driveway to check his mailbox   Time 8   Period Weeks   Status New     PT LONG TERM GOAL  #9   TITLE The patient will be able to ascend and descend full  flight of steps at home reciprocally with cane and 1 railing   Time 8   Period Weeks   Status  New      PT LONG TERM GOAL  #10   TITLE The patient will have improved right LE strength needed to get in/out of the bathtub using UE assist /bars as needed   Time 8   Period Weeks   Status New     PT LONG TERM GOAL  #11   TITLE The patient will have the right hip strength to 4+/5 needed to elevate and lower stool in the operating room   Time 8   Period Weeks   Status New     PT LONG TERM GOAL  #12   TITLE The patient will be able to ambulate 5 minutes on the treadmill as needed to initiate cardiac rehab program   Time 8   Period Weeks   Status New               Plan - 08/30/16 1623    Clinical Impression Statement Pt requires moderate UE support with step downs. Was bale to increase weight on the leg press which pt reported he could really feel in his right quad. Initiated treadmill today where pt only felt comfortable walking at very slow speed x 3 min.    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   PT Frequency 2x / week   PT Duration 8 weeks   PT Treatment/Interventions ADLs/Self Care Home Management;Cryotherapy;Electrical Stimulation;Functional mobility training;Stair training;Gait training;Ultrasound;Moist Heat;Therapeutic activities;Therapeutic exercise;Balance training;Neuromuscular re-education;Patient/family education;Passive range of motion;Manual techniques;Energy conservation;Other (comment)   PT Next Visit Plan treadmill, increase time. Quad strength; single leg stance and hip strength.   Consulted and Agree with Plan of Care Patient      Patient will benefit from skilled therapeutic intervention in order to improve the following deficits and impairments:  Abnormal gait, Difficulty walking, Decreased endurance, Pain, Decreased activity tolerance, Decreased balance, Decreased strength, Decreased mobility  Visit Diagnosis: Muscle weakness (generalized)     Problem  List Patient Active Problem List   Diagnosis Date Noted  . Long term current use of amiodarone 08/07/2016  . Right ankle pain   . Pain   . Tibial pain   . Acute idiopathic gout of right ankle   . Right knee pain   . Diabetes mellitus type 2 in obese ([place])   . Labile blood pressure   . Acute blood loss anemia   . Diabetes mellitus type 2 in nonobese (H[facility])   . Debility 02/22/2016  . 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 Weakness of both lower extremities   . Lower extremity weakness   . Pneumonia   . Acute lumbar back pain   . Non-traumatic rhabdomyolysis   . Retroperitoneal bleed   . Back pain   . Leg weakness, bilateral   . Paroxysmal atrial fibrillation ([place])   . Ventricular fibrillation ([place]) 02/09/2016  . Aortic aneurysm without rupture ([place]) 02/09/2016  . Aortic insufficiency 02/09/2016  . HCAP (healthcare-associated pneumonia)   . Cardiac arrest ([place]) 02/01/2016  . Acute on chronic systolic heart failure ([place])   . Acute encephalopathy   . Thyroid activity decreased   . Acute hypoxemic respiratory failure ([place])   . Acute respiratory failure ([place])   . Encounter for central line placement   . Arrhythmia   . Altered mental status   . CKD (chronic kidney disease), stage IV ([place]) 08/06/2013    Class: Chronic  . Long term (current) use of anticoagulants 08/06/2013  Class: Chronic  . Special screening for malignant neoplasms, colon 04/11/2013  . Colon cancer screening 03/04/2013  . Chronic anticoagulation 03/04/2013  . Atrial flutter ([place]) 03/18/2012    Class: Acute  . Chronic systolic heart failure ([place]) 02/13/2012    Class: Acute  . Hypertension, accelerated 02/13/2012  . Aortic valve regurgitation, acquired 02/13/2012  . Aortic root enlargement ([place]) 02/13/2012  . Hyperlipidemia 02/13/2012  . Left bundle branch block 02/13/2012  . Obesity (BMI 30-39.9) 02/13/2012    COCHRAN,JENNIFER, PTA 08/30/2016, 4:59 PM  [facility] [address] [place], [place], [address] Phone: [phone]   Fax:  [phone]  Name: Taris Galindo  MD MRN: [id number] Date of Birth: [birth year]

## 2016-09-01 ENCOUNTER — Ambulatory Visit: Payer: BC Managed Care – PPO | Admitting: Physical Therapy

## 2016-09-01 DIAGNOSIS — M6281 Muscle weakness (generalized): Secondary | ICD-10-CM | POA: Diagnosis not present

## 2016-09-01 DIAGNOSIS — M25561 Pain in right knee: Secondary | ICD-10-CM

## 2016-09-01 DIAGNOSIS — R2689 Other abnormalities of gait and mobility: Secondary | ICD-10-CM

## 2016-09-01 NOTE — Therapy (Signed)
[facility] [address] [place], [place], [address] Phone: [phone]   Fax:  [phone]  Physical Therapy Treatment  Patient Details  Name: Eric Sizemore  MD MRN: [id number] Date of Birth: 19-Jan-1951 Referring Provider: Dr. Roe Coombs  Encounter Date: 09/01/2016      PT End of Session - 09/01/16 1035    Visit Number 38   Date for PT Re-Evaluation 10/20/16   Authorization Type BCBS   PT Start Time 1021   PT Stop Time 1100   PT Time Calculation (min) 39 min   Activity Tolerance Patient tolerated treatment well      Past Medical History:  Diagnosis Date  . CHF (congestive heart failure) ([place])   . Chronic kidney disease    kidney fx studies increased   . Chronic lower back pain   . Claustrophobia   . Dysrhythmia    "palpitations"  . Exertional dyspnea 01/2012  . Heart murmur   . Hypertension   . Hypothyroidism   . Migraine 02/13/12   "opthalmic"  . Varicose vein of leg    right    Past Surgical History:  Procedure Laterality Date  . CARDIAC CATHETERIZATION N/A 02/17/2016   Procedure: Left Heart Cath and Coronary Angiography;  Surgeon: Jettie Booze, MD;  Location: [place] [facility];  Service: Cardiovascular;  Laterality: N/A;  . CARDIOVERSION  03/22/2012   Procedure: CARDIOVERSION;  Surgeon: Candee Furbish, MD;  Location: [facility] ENDOSCOPY;  Service: Cardiovascular;  Laterality: N/A;  . CARDIOVERSION  04/19/2012   Procedure: CARDIOVERSION;  Surgeon: Sinclair Grooms, MD;  Location: [place];  Service: Cardiovascular;  Laterality: N/A;  . COLONOSCOPY N/A 04/11/2013   Procedure: COLONOSCOPY;  Surgeon: Inda Castle, MD;  Location: [facility] ENDOSCOPY;  Service: Endoscopy;  Laterality: N/A;  . COLONOSCOPY N/A 04/11/2013   Procedure: COLONOSCOPY;  Surgeon: Inda Castle, MD;  Location: [facility] ENDOSCOPY;  Service: Endoscopy;  Laterality: N/A;  . EP IMPLANTABLE DEVICE N/A 02/17/2016   Procedure: BiV ICD Insertion  CRT-D;  Surgeon: Evans Lance, MD;  Location: [place] [facility];  Service: Cardiovascular;  Laterality: N/A;  . FINGER SURGERY  2012   "4th digit right hand; thumb on left hand"  . RADIOLOGY WITH ANESTHESIA N/A 02/11/2016   Procedure: MRI OF THE BRAIN WITHOUT CONTRAST, LUMBAR WITHOUT CONTRAST;  Surgeon: Medication Radiologist, MD;  Location: [place];  Service: Radiology;  Laterality: N/A;  DR. WOOD/MRI  . Skin melanocytoma excision  2012   "above left clavicle"  . TEE WITHOUT CARDIOVERSION  03/22/2012   Procedure: TRANSESOPHAGEAL ECHOCARDIOGRAM (TEE);  Surgeon: Candee Furbish, MD;  [facility] ENDOSCOPY;  Service: Cardiovascular;  Laterality: N/A;  . TEE WITHOUT CARDIOVERSION N/A 02/08/2016   Procedure: TRANSESOPHAGEAL ECHOCARDIOGRAM (TEE);  Surgeon: Lelon Perla, MD;  Location: [facility] ENDOSCOPY;  Service: Cardiovascular;  Laterality: N/A;    There were no vitals filed for this visit.      Subjective Assessment - 09/01/16 1024    Subjective States he was mildly sore after last visit.  Patient states he did not take a pain pill prior to coming today like he usually does.  Reports he is sore in his right thigh after walking in [employer] yesterday and usually sore after work.  States he drove a little more this week without difficulty.     Currently in Pain? No/denies   Pain Score 0-No pain   Pain Location Knee   Pain Orientation Right   Pain  Type Chronic pain                         [facility] Adult PT Treatment/Exercise - 09/01/16 0001      Knee/Hip Exercises: Aerobic   Stationary Bike L4 x 6 min   Warm up, review of status   Tread Mill .8 mph x 8:00   Nustep L3 no arms 6 min     Knee/Hip Exercises: Machines for Strengthening   Total Gym Leg Press Seat 7 Bil 95# 10x , 100# 10x 2 RTLE 55# 3x10     Knee/Hip Exercises: Standing   Forward Step Up Right;2 sets;10 reps;Hand Hold: 1;Step Height: 6"   Forward Step Up Limitations step taps with left on next step   Step Down Left;1  set;Hand Hold: 2;Step Height: 2"                  PT Short Term Goals - 09/01/16 1114      PT SHORT TERM GOAL #1   Title independent with initial HEP    Status Achieved     PT SHORT TERM GOAL #2   Title be able to go to the gym to do the bike or elliptical for improving strength   Status Achieved     PT SHORT TERM GOAL #3   Title right knee extension AROM in sitting -30 degress due to increase strength   Status Achieved     PT SHORT TERM GOAL #4   Title pain in right leg decreased >/= 25%    Status Achieved     PT SHORT TERM GOAL #5   Title able to get in and out of car without helping his right leg due to increased strength   Status Achieved           PT Long Term Goals - 09/01/16 1114      PT LONG TERM GOAL #1   Title independent with HEP and basic gym program including understanding of  how to progress himself   Time 8   Period Weeks   Status On-going     PT LONG TERM GOAL #2   Title able to stand for 25 min with pain decreased >/= 50% so he is able to cook a meal.   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   Status Achieved     PT LONG TERM GOAL #5   Title return to household chores due to lower extremity strength >/= 4/5   Time 8   Period Weeks   Status On-going     PT LONG TERM GOAL #6   Title return to driving due to increased right lower extremity strength >/= 4-/5   Time 8   Period Weeks   Status On-going     PT LONG TERM GOAL #7   Title pain decreased >/= 50% in right knee   Status Achieved     PT LONG TERM GOAL #8   Title The patient will be able to ambulate with SPC on the incline (and decline) of his driveway to check his mailbox   Time 8   Period Weeks   Status On-going     PT LONG TERM GOAL  #  9   TITLE The patient will be able to ascend and descend  full  flight of steps at home reciprocally with cane and 1 railing   Time 8   Period Weeks   Status On-going     PT LONG TERM GOAL  #10   TITLE The patient will have improved right LE strength needed to get in/out of the bathtub using UE assist /bars as needed   Period Weeks   Status On-going     PT LONG TERM GOAL  #11   TITLE The patient will have the right hip strength to 4+/5 needed to elevate and lower stool in the operating room   Time 8   Period Weeks   Status On-going     PT LONG TERM GOAL  #12   TITLE The patient will be able to ambulate 5 minutes on the treadmill as needed to initiate cardiac rehab program   Time 8   Period Weeks   Status On-going               Plan - 09/01/16 1110    Clinical Impression Statement The patient is able to perform a progression of quad strengthening.  He continue to have quick muscle fatigue with single leg standing, step ups and especially step downs.  Verbal and tactile cues to avoid locking out his knee in hyperextension to compensate for weakness.  Able to increase time on treadmill to 8 min at a slow speed.  Therapist closely monitoring for safety, excessive muscular fatigue and pain.     PT Next Visit Plan treadmill, increase time. Quad strength; single leg stance and hip strength.      Patient will benefit from skilled therapeutic intervention in order to improve the following deficits and impairments:     Visit Diagnosis: Muscle weakness (generalized)  Other abnormalities of gait and mobility  Right knee pain, unspecified chronicity     Problem List Patient Active Problem List   Diagnosis Date Noted  . Long term current use of amiodarone 08/07/2016  . Right ankle pain   . Pain   . Tibial pain   . Acute idiopathic gout of right ankle   . Right knee pain   . Diabetes mellitus type 2 in obese ([place])   . Labile blood pressure   . Acute blood loss anemia   . Diabetes mellitus type 2 in nonobese ([facility])   . Debility  02/22/2016  . 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 Weakness of both lower extremities   . Lower extremity weakness   . Pneumonia   . Acute lumbar back pain   . Non-traumatic rhabdomyolysis   . Retroperitoneal bleed   . Back pain   . Leg weakness, bilateral   . Paroxysmal atrial fibrillation ([place])   . Ventricular fibrillation ([place]) 02/09/2016  . Aortic aneurysm without rupture ([place]) 02/09/2016  . Aortic insufficiency 02/09/2016  . HCAP (healthcare-associated pneumonia)   . Cardiac arrest ([place]) 02/01/2016  . Acute on chronic systolic heart failure ([place])   . Acute encephalopathy   . Thyroid activity decreased   . Acute hypoxemic respiratory failure ([place])   . Acute respiratory failure ([place])   . Encounter for central line placement   . Arrhythmia   . Altered mental status   . CKD (chronic kidney disease), stage IV ([place]) 08/06/2013    Class: Chronic  . Long term (current) use of anticoagulants 08/06/2013    Class: Chronic  . Special  screening for malignant neoplasms, colon 04/11/2013  . Colon cancer screening 03/04/2013  . Chronic anticoagulation 03/04/2013  . Atrial flutter ([place]) 03/18/2012    Class: Acute  . Chronic systolic heart failure ([place]) 02/13/2012    Class: Acute  . Hypertension, accelerated 02/13/2012  . Aortic valve regurgitation, acquired 02/13/2012  . Aortic root enlargement ([place]) 02/13/2012  . Hyperlipidemia 02/13/2012  . Left bundle branch block 02/13/2012  . Obesity (BMI 30-39.9) 02/13/2012   Ruben Im, PT 09/01/16 11:17 AM Phone: [phone] Fax: [phone]  Alvera Singh 09/01/2016, 11:17 AM  [facility] [address] [place], [place], [address] Phone: [phone]   Fax:  [phone]  Name: Kennen Stammer  MD MRN: [id number] Date of Birth: [birth year]

## 2016-09-05 ENCOUNTER — Encounter: Payer: Self-pay | Admitting: Cardiology

## 2016-09-05 ENCOUNTER — Ambulatory Visit: Payer: BC Managed Care – PPO | Admitting: Physical Therapy

## 2016-09-05 DIAGNOSIS — M25561 Pain in right knee: Secondary | ICD-10-CM

## 2016-09-05 DIAGNOSIS — R2689 Other abnormalities of gait and mobility: Secondary | ICD-10-CM

## 2016-09-05 DIAGNOSIS — M6281 Muscle weakness (generalized): Secondary | ICD-10-CM | POA: Diagnosis not present

## 2016-09-05 NOTE — Therapy (Signed)
[facility] [address], [name], [place], [address] Phone: [phone]   Fax:  [phone]  Physical Therapy Treatment  Patient Details  Name: [place] Fuchs  MD MRN: [id number] Date of Birth: 11/11/1950 Referring Provider: Dr. Roe Coombs  Encounter Date: 09/05/2016      PT End of Session - 09/05/16 2102    Visit Number 69   Date for PT Re-Evaluation 10/20/16   Authorization Type BCBS   PT Start Time 1249   PT Stop Time 1327   PT Time Calculation (min) 38 min   Activity Tolerance Patient tolerated treatment well      Past Medical History:  Diagnosis Date  . CHF (congestive heart failure) ([place])   . Chronic kidney disease    kidney fx studies increased   . Chronic lower back pain   . Claustrophobia   . Dysrhythmia    "palpitations"  . Exertional dyspnea 01/2012  . Heart murmur   . Hypertension   . Hypothyroidism   . Migraine 02/13/12   "opthalmic"  . Varicose vein of leg    right    Past Surgical History:  Procedure Laterality Date  . CARDIAC CATHETERIZATION N/A 02/17/2016   Procedure: Left Heart Cath and Coronary Angiography;  Surgeon: Jettie Booze, MD;  Location: [place] [facility];  Service: Cardiovascular;  Laterality: N/A;  . CARDIOVERSION  03/22/2012   Procedure: CARDIOVERSION;  Surgeon: Candee Furbish, MD;  Location: [facility];  Service: Cardiovascular;  Laterality: N/A;  . CARDIOVERSION  04/19/2012   Procedure: CARDIOVERSION;  Surgeon: Sinclair Grooms, MD;  Location: [place];  Service: Cardiovascular;  Laterality: N/A;  . COLONOSCOPY N/A 04/11/2013   Procedure: COLONOSCOPY;  Surgeon: Inda Castle, MD;  Location: [facility] ENDOSCOPY;  Service: Endoscopy;  Laterality: N/A;  . COLONOSCOPY N/A 04/11/2013   Procedure: COLONOSCOPY;  Surgeon: Inda Castle, MD;  Location: [facility] ENDOSCOPY;  Service: Endoscopy;  Laterality: N/A;  . EP IMPLANTABLE DEVICE N/A 02/17/2016   Procedure: BiV ICD Insertion  CRT-D;  Surgeon: Evans Lance, MD;  Location: [place] [facility];  Service: Cardiovascular;  Laterality: N/A;  . FINGER SURGERY  2012   "4th digit right hand; thumb on left hand"  . RADIOLOGY WITH ANESTHESIA N/A 02/11/2016   Procedure: MRI OF THE BRAIN WITHOUT CONTRAST, LUMBAR WITHOUT CONTRAST;  Surgeon: Medication Radiologist, MD;  Location: [place];  Service: Radiology;  Laterality: N/A;  DR. WOOD/MRI  . Skin melanocytoma excision  2012   "above left clavicle"  . TEE WITHOUT CARDIOVERSION  03/22/2012   Procedure: TRANSESOPHAGEAL ECHOCARDIOGRAM (TEE);  Surgeon: Candee Furbish, MD;  Location: [facility] ENDOSCOPY;  Service: Cardiovascular;  Laterality: N/A;  . TEE WITHOUT CARDIOVERSION N/A 02/08/2016   Procedure: TRANSESOPHAGEAL ECHOCARDIOGRAM (TEE);  Surgeon: Lelon Perla, MD;  Location: [facility] ENDOSCOPY;  Service: Cardiovascular;  Laterality: N/A;    There were no vitals filed for this visit.      Subjective Assessment - 09/05/16 1254    Subjective Patient 19 min late,  Coming from work.   Some soreness and tightness the next day after therapy but not too bad.     Currently in Pain? No/denies   Pain Score 0-No pain   Pain Orientation Right   Pain Type Chronic pain                         [facility] Adult PT Treatment/Exercise - 09/05/16 0001  Knee/Hip Exercises: Aerobic   Tread Mill 1.0 mph 2 min incline 10% grade;  6 min flat  8 min total   Nustep L3 no arms 6 min     Knee/Hip Exercises: Standing   Forward Step Up Right;2 sets;10 reps;Hand Hold: 1;Step Height: 6"   Forward Step Up Limitations step taps with left on next step     Knee/Hip Exercises: Seated   Other Seated Knee/Hip Exercises red band seated "foot pump"  simulation 3x10   Sit to Sand 2 sets;10 reps;without UE support  mat table with blue foam and mirror feedback                  PT Short Term Goals - 09/05/16 2108      PT SHORT TERM GOAL #1   Title independent with initial HEP    Status  Achieved     PT SHORT TERM GOAL #2   Title be able to go to the gym to do the bike or elliptical for improving strength   Status Achieved     PT SHORT TERM GOAL #3   Title right knee extension AROM in sitting -30 degress due to increase strength   Status Achieved     PT SHORT TERM GOAL #4   Title pain in right leg decreased >/= 25%    Status Achieved     PT SHORT TERM GOAL #5   Title able to get in and out of car without helping his right leg due to increased strength   Status Achieved           PT Long Term Goals - 09/05/16 2108      PT LONG TERM GOAL #1   Title independent with HEP and basic gym program including understanding of  how to progress himself   Time 8   Period Weeks   Status On-going     PT LONG TERM GOAL #2   Title able to stand for 25 min with pain decreased >/= 50% so he is able to cook a meal.   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   Status Achieved     PT LONG TERM GOAL #5   Title return to household chores due to lower extremity strength >/= 4/5   Time 8   Period Weeks   Status On-going     PT LONG TERM GOAL #6   Title return to driving due to increased right lower extremity strength >/= 4-/5   Time 8   Period Weeks   Status On-going     PT LONG TERM GOAL #7   Title pain decreased >/= 50% in right knee   Status Achieved     PT LONG TERM GOAL #8   Title The patient will be able to ambulate with SPC on the incline (and decline) of his driveway to check his mailbox   Time 8   Period Weeks   Status On-going     PT LONG TERM GOAL  #9   TITLE The patient will be able to ascend and descend full  flight of steps at home reciprocally with cane and 1 railing   Time 8   Period Weeks   Status On-going  PT LONG TERM GOAL  #10   TITLE The patient will have improved  right LE strength needed to get in/out of the bathtub using UE assist /bars as needed   Time 8   Period Weeks   Status On-going     PT LONG TERM GOAL  #11   TITLE The patient will have the right hip strength to 4+/5 needed to elevate and lower stool in the operating room   Time 8   Period Weeks   Status On-going     PT LONG TERM GOAL  #12   TITLE The patient will be able to ambulate 5 minutes on the treadmill as needed to initiate cardiac rehab program   Time 8   Period Weeks   Status On-going               Plan - 09/05/16 2103    Clinical Impression Statement The patient presents with [facility] but does not use in clinic.  He worked this morning.  He reports continued difficulty using the foot pedal at work to raise and lower chair height.  Initiated incline walking on treadmill at 10% grade with patient reports of increased knee tightness and muscular fatigue.  Therapist closely monitoring response with all and providing close supervision for safety.     PT Next Visit Plan treadmill, increase time and incline. Quad strength; single leg stance and hip strength.  Functional strength for work/home tasks      Patient will benefit from skilled therapeutic intervention in order to improve the following deficits and impairments:     Visit Diagnosis: Muscle weakness (generalized)  Other abnormalities of gait and mobility  Right knee pain, unspecified chronicity     Problem List Patient Active Problem List   Diagnosis Date Noted  . Long term current use of amiodarone 08/07/2016  . Right ankle pain   . Pain   . Tibial pain   . Acute idiopathic gout of right ankle   . Right knee pain   . Diabetes mellitus type 2 in obese ([place])   . Labile blood pressure   . Acute blood loss anemia   . Diabetes mellitus type 2 in nonobese ([facility])   . Debility 02/22/2016  . 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 Weakness of both lower extremities   . Lower extremity  weakness   . Pneumonia   . Acute lumbar back pain   . Non-traumatic rhabdomyolysis   . Retroperitoneal bleed   . Back pain   . Leg weakness, bilateral   . Paroxysmal atrial fibrillation ([place])   . Ventricular fibrillation ([place]) 02/09/2016  . Aortic aneurysm without rupture ([place]) 02/09/2016  . Aortic insufficiency 02/09/2016  . HCAP (healthcare-associated pneumonia)   . Cardiac arrest ([place]) 02/01/2016  . Acute on chronic systolic heart failure ([place])   . Acute encephalopathy   . Thyroid activity decreased   . Acute hypoxemic respiratory failure ([place])   . Acute respiratory failure ([place])   . Encounter for central line placement   . Arrhythmia   . Altered mental status   . CKD (chronic kidney disease), stage IV ([place]) 08/06/2013    Class: Chronic  . Long term (current) use of anticoagulants 08/06/2013    Class: Chronic  . Special screening for malignant neoplasms, colon 04/11/2013  . Colon cancer screening 03/04/2013  . Chronic anticoagulation 03/04/2013  . Atrial flutter ([place]) 03/18/2012    Class: Acute  . Chronic systolic heart failure ([place]) 02/13/2012  Class: Acute  . Hypertension, accelerated 02/13/2012  . Aortic valve regurgitation, acquired 02/13/2012  . Aortic root enlargement ([place]) 02/13/2012  . Hyperlipidemia 02/13/2012  . Left bundle branch block 02/13/2012  . Obesity (BMI 30-39.9) 02/13/2012   Ruben Im, PT 09/05/16 9:12 PM Phone: [phone] Fax: [phone]  Alvera Singh 09/05/2016, 9:10 PM  [facility] [address] [place], [place], [address] Phone: [phone]   Fax:  [phone]  Name: [place]  MD MRN: [id number] Date of Birth: 1951-05-26

## 2016-09-08 ENCOUNTER — Other Ambulatory Visit: Payer: BC Managed Care – PPO | Admitting: *Deleted

## 2016-09-08 ENCOUNTER — Encounter: Payer: Self-pay | Admitting: Physical Therapy

## 2016-09-08 ENCOUNTER — Ambulatory Visit: Payer: BC Managed Care – PPO | Admitting: Physical Therapy

## 2016-09-08 DIAGNOSIS — Z79899 Other long term (current) drug therapy: Secondary | ICD-10-CM

## 2016-09-08 DIAGNOSIS — I5022 Chronic systolic (congestive) heart failure: Secondary | ICD-10-CM

## 2016-09-08 DIAGNOSIS — M6281 Muscle weakness (generalized): Secondary | ICD-10-CM | POA: Diagnosis not present

## 2016-09-08 DIAGNOSIS — M25561 Pain in right knee: Secondary | ICD-10-CM

## 2016-09-08 DIAGNOSIS — R2689 Other abnormalities of gait and mobility: Secondary | ICD-10-CM

## 2016-09-08 LAB — COMPREHENSIVE METABOLIC PANEL
ALK PHOS: 56 U/L (ref 40–115)
ALT: 12 U/L (ref 9–46)
AST: 26 U/L (ref 10–35)
Albumin: 3.6 g/dL (ref 3.6–5.1)
BILIRUBIN TOTAL: 0.5 mg/dL (ref 0.2–1.2)
BUN: 27 mg/dL — AB (ref 7–25)
CO2: 21 mmol/L (ref 20–31)
CREATININE: 1.95 mg/dL — AB (ref 0.70–1.25)
Calcium: 8.8 mg/dL (ref 8.6–10.3)
Chloride: 107 mmol/L (ref 98–110)
GLUCOSE: 107 mg/dL — AB (ref 65–99)
Potassium: 4.4 mmol/L (ref 3.5–5.3)
SODIUM: 141 mmol/L (ref 135–146)
Total Protein: 7 g/dL (ref 6.1–8.1)

## 2016-09-08 LAB — TSH: TSH: 3.51 mIU/L (ref 0.40–4.50)

## 2016-09-08 LAB — BRAIN NATRIURETIC PEPTIDE: Brain Natriuretic Peptide: 384.6 pg/mL — ABNORMAL HIGH (ref <100)

## 2016-09-08 NOTE — Therapy (Signed)
[facility] [address], [place], [place], [address] Phone: [phone]   Fax:  [phone]  Physical Therapy Treatment  Patient Details  Name: [place]  MD MRN: [id number] Date of Birth: 12/13/1950 Referring Provider: Dr. Roe Coombs  Encounter Date: 09/08/2016      PT End of Session - 09/08/16 0935    Visit Number 40   Date for PT Re-Evaluation 10/20/16   Authorization Type BCBS   PT Start Time 0934   PT Stop Time 1015   PT Time Calculation (min) 41 min   Activity Tolerance Patient tolerated treatment well   Behavior During Therapy [facility] for tasks assessed/performed      Past Medical History:  Diagnosis Date  . CHF (congestive heart failure) ([place])   . Chronic kidney disease    kidney fx studies increased   . Chronic lower back pain   . Claustrophobia   . Dysrhythmia    "palpitations"  . Exertional dyspnea 01/2012  . Heart murmur   . Hypertension   . Hypothyroidism   . Migraine 02/13/12   "opthalmic"  . Varicose vein of leg    right    Past Surgical History:  Procedure Laterality Date  . CARDIAC CATHETERIZATION N/A 02/17/2016   Procedure: Left Heart Cath and Coronary Angiography;  Surgeon: Jettie Booze, MD;  Location: [place] [facility];  Service: Cardiovascular;  Laterality: N/A;  . CARDIOVERSION  03/22/2012   Procedure: CARDIOVERSION;  Surgeon: Candee Furbish, MD;  Location: [facility] ENDOSCOPY;  Service: Cardiovascular;  Laterality: N/A;  . CARDIOVERSION  04/19/2012   Procedure: CARDIOVERSION;  Surgeon: Sinclair Grooms, MD;  Location: [place];  Service: Cardiovascular;  Laterality: N/A;  . COLONOSCOPY N/A 04/11/2013   Procedure: COLONOSCOPY;  Surgeon: Inda Castle, MD;  Location: [facility]OS[facility];  Service: Endoscopy;  Laterality: N/A;  . COLONOSCOPY N/A 04/11/2013   Procedure: COLONOSCOPY;  Surgeon: Inda Castle, MD;  Location: [facility] ENDOSCOPY;  Service: Endoscopy;  Laterality: N/A;  . EP  IMPLANTABLE DEVICE N/A 02/17/2016   Procedure: BiV ICD Insertion CRT-D;  Surgeon: Evans Lance, MD;  Location: [place] [facility];  Service: Cardiovascular;  Laterality: N/A;  . FINGER SURGERY  2012   "4th digit right hand; thumb on left hand"  . RADIOLOGY WITH ANESTHESIA N/A 02/11/2016   Procedure: MRI OF THE BRAIN WITHOUT CONTRAST, LUMBAR WITHOUT CONTRAST;  Surgeon: Medication Radiologist, MD;  Location: [place];  Service: Radiology;  Laterality: N/A;  DR. WOOD/MRI  . Skin melanocytoma excision  2012   "above left clavicle"  . TEE WITHOUT CARDIOVERSION  03/22/2012   Procedure: TRANSESOPHAGEAL ECHOCARDIOGRAM (TEE);  Surgeon: Candee Furbish, MD;  Location: [facility] ENDOSCOPY;  Service: Cardiovascular;  Laterality: N/A;  . TEE WITHOUT CARDIOVERSION N/A 02/08/2016   Procedure: TRANSESOPHAGEAL ECHOCARDIOGRAM (TEE);  Surgeon: Lelon Perla, MD;  Location: [facility] ENDOSCOPY;  Service: Cardiovascular;  Laterality: N/A;    There were no vitals filed for this visit.      Subjective Assessment - 09/08/16 0937    Subjective I am walking around the house without a device more. No new complaints.    Currently in Pain? No/denies   Pain Location Knee   Pain Orientation Right   Pain Descriptors / Indicators Tightness                         [facility] Adult PT Treatment/Exercise - 09/08/16 0001      Knee/Hip  Exercises: Aerobic   Stationary Bike L3 x 6 min  warm up and concurrent review of status   Tread Mill 66mph x 3 min, 1.79mph x 2 min, then 1.2 for 6 min: total: 11 min  At 8 min: increased 2% incline for 2 min     Knee/Hip Exercises: Machines for Strengthening   Total Gym Leg Press Seat 7; Bil 100# 30x, RTLE 60#  30x     Knee/Hip Exercises: Standing   Hip Flexion Stengthening;Right;3 sets;10 reps;Knee bent   Hip Abduction Stengthening;Right;2 sets;10 reps                  PT Short Term Goals - 09/05/16 2108      PT SHORT TERM GOAL #1   Title independent with initial HEP     Status Achieved     PT SHORT TERM GOAL #2   Title be able to go to the gym to do the bike or elliptical for improving strength   Status Achieved     PT SHORT TERM GOAL #3   Title right knee extension AROM in sitting -30 degress due to increase strength   Status Achieved     PT SHORT TERM GOAL #4   Title pain in right leg decreased >/= 25%    Status Achieved     PT SHORT TERM GOAL #5   Title able to get in and out of car without helping his right leg due to increased strength   Status Achieved           PT Long Term Goals - 09/05/16 2108      PT LONG TERM GOAL #1   Title independent with HEP and basic gym program including understanding of  how to progress himself   Time 8   Period Weeks   Status On-going     PT LONG TERM GOAL #2   Title able to stand for 25 min with pain decreased >/= 50% so he is able to cook a meal.   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   Status Achieved     PT LONG TERM GOAL #5   Title return to household chores due to lower extremity strength >/= 4/5   Time 8   Period Weeks   Status On-going     PT LONG TERM GOAL #6   Title return to driving due to increased right lower extremity strength >/= 4-/5   Time 8   Period Weeks   Status On-going     PT LONG TERM GOAL #7   Title pain decreased >/= 50% in right knee   Status Achieved     PT LONG TERM GOAL #8   Title The patient will be able to ambulate with SPC on the incline (and decline) of his driveway to check his mailbox   Time 8   Period Weeks   Status On-going     PT LONG TERM GOAL  #9   TITLE The patient will be able to ascend and descend full  flight of steps at home reciprocally with cane and 1 railing   Time 8   Period Weeks   Status On-going     PT LONG TERM GOAL  #10  TITLE The patient will have  improved right LE strength needed to get in/out of the bathtub using UE assist /bars as needed   Time 8   Period Weeks   Status On-going     PT LONG TERM GOAL  #11   TITLE The patient will have the right hip strength to 4+/5 needed to elevate and lower stool in the operating room   Time 8   Period Weeks   Status On-going     PT LONG TERM GOAL  #12   TITLE The patient will be able to ambulate 5 minutes on the treadmill as needed to initiate cardiac rehab program   Time 8   Period Weeks   Status On-going               Plan - 09/08/16 0942    Clinical Impression Statement Pt reports today he is walking around his home without the device more and his confidence in walking and his balance has greatly improved.  This s demonstrated by his standing outside more to direct activities like putting up [place] decorations and blowing leaves. Increased all loads today to continue working on hip & knee strength and balance.    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   PT Frequency 2x / week   PT Duration 8 weeks   PT Treatment/Interventions ADLs/Self Care Home Management;Cryotherapy;Electrical Stimulation;Functional mobility training;Stair training;Gait training;Ultrasound;Moist Heat;Therapeutic activities;Therapeutic exercise;Balance training;Neuromuscular re-education;Patient/family education;Passive range of motion;Manual techniques;Energy conservation;Other (comment)   PT Next Visit Plan treadmill, increase time and incline. Quad strength; single leg stance and hip strength.  Functional strength for work/home tasks   Consulted and Agree with Plan of Care Patient      Patient will benefit from skilled therapeutic intervention in order to improve the following deficits and impairments:  Abnormal gait, Difficulty walking, Decreased endurance, Pain, Decreased activity tolerance, Decreased  balance, Decreased strength, Decreased mobility  Visit Diagnosis: Muscle weakness (generalized)  Other abnormalities of gait and mobility  Right knee pain, unspecified chronicity     Problem List Patient Active Problem List   Diagnosis Date Noted  . Long term current use of amiodarone 08/07/2016  . Right ankle pain   . Pain   . Tibial pain   . Acute idiopathic gout of right ankle   . Right knee pain   . Diabetes mellitus type 2 in obese ([place])   . Labile blood pressure   . Acute blood loss anemia   . Diabetes mellitus type 2 in nonobese ([facility])   . Debility 02/22/2016  . 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 Weakness of both lower extremities   . Lower extremity weakness   . Pneumonia   . Acute lumbar back pain   . Non-traumatic rhabdomyolysis   . Retroperitoneal bleed   . Back pain   . Leg weakness, bilateral   . Paroxysmal atrial fibrillation ([place])   . Ventricular fibrillation ([place]) 02/09/2016  . Aortic aneurysm without rupture ([place]) 02/09/2016  . Aortic insufficiency 02/09/2016  . HCAP (healthcare-associated pneumonia)   . Cardiac arrest ([place]) 02/01/2016  . Acute on chronic systolic heart failure ([place])   . Acute encephalopathy   . Thyroid activity decreased   . Acute hypoxemic respiratory failure ([place])   . Acute respiratory failure ([place])   . Encounter for central line placement   . Arrhythmia   . Altered mental status   . CKD (  chronic kidney disease), stage IV ([place]) 08/06/2013    Class: Chronic  . Long term (current) use of anticoagulants 08/06/2013    Class: Chronic  . Special screening for malignant neoplasms, colon 04/11/2013  . Colon cancer screening 03/04/2013  . Chronic anticoagulation 03/04/2013  . Atrial flutter ([place]) 03/18/2012    Class: Acute  . Chronic systolic heart failure ([place]) 02/13/2012    Class: Acute  . Hypertension, accelerated 02/13/2012  . Aortic valve regurgitation, acquired 02/13/2012  . Aortic root enlargement  ([place]) 02/13/2012  . Hyperlipidemia 02/13/2012  . Left bundle branch block 02/13/2012  . Obesity (BMI 30-39.9) 02/13/2012    Stephaney Steven, PTA 09/08/2016, 10:13 AM  [facility] [address] [place], [place], [address] Phone: [phone]   Fax:  [phone]  Name: [place]  MD MRN: [id number] Date of Birth: Jun 15, 1951

## 2016-09-13 ENCOUNTER — Ambulatory Visit: Payer: BC Managed Care – PPO | Admitting: Physical Therapy

## 2016-09-13 ENCOUNTER — Encounter: Payer: Self-pay | Admitting: Physical Therapy

## 2016-09-13 DIAGNOSIS — M6281 Muscle weakness (generalized): Secondary | ICD-10-CM | POA: Diagnosis not present

## 2016-09-13 DIAGNOSIS — R2689 Other abnormalities of gait and mobility: Secondary | ICD-10-CM

## 2016-09-13 NOTE — Therapy (Signed)
[facility] [address], [place], [place], [address] Phone: [phone]   Fax:  [phone]  Physical Therapy Treatment  Patient Details  Name: Eric Iddings  MD MRN: [id number] Date of Birth: May 25, 1951 Referring Provider: Dr. Roe Coombs  Encounter Date: 09/13/2016      PT End of Session - 09/13/16 1631    Visit Number 41   Date for PT Re-Evaluation 10/20/16   Authorization Type BCBS   PT Start Time 1628   PT Stop Time 1710   PT Time Calculation (min) 42 min   Activity Tolerance Patient tolerated treatment well   Behavior During Therapy [facility] for tasks assessed/performed      Past Medical History:  Diagnosis Date  . CHF (congestive heart failure) ([place])   . Chronic kidney disease    kidney fx studies increased   . Chronic lower back pain   . Claustrophobia   . Dysrhythmia    "palpitations"  . Exertional dyspnea 01/2012  . Heart murmur   . Hypertension   . Hypothyroidism   . Migraine 02/13/12   "opthalmic"  . Varicose vein of leg    right    Past Surgical History:  Procedure Laterality Date  . CARDIAC CATHETERIZATION N/A 02/17/2016   Procedure: Left Heart Cath and Coronary Angiography;  Surgeon: Jettie Booze, MD;  Location: [place] [facility];  Service: Cardiovascular;  Laterality: N/A;  . CARDIOVERSION  03/22/2012   Procedure: CARDIOVERSION;  Surgeon: Candee Furbish, MD;  Location: [facility] ENDOSCOPY;  Service: Cardiovascular;  Laterality: N/A;  . CARDIOVERSION  04/19/2012   Procedure: CARDIOVERSION;  Surgeon: Sinclair Grooms, MD;  Location: [place];  Service: Cardiovascular;  Laterality: N/A;  . COLONOSCOPY N/A 04/11/2013   Procedure: COLONOSCOPY;  Surgeon: Inda Castle, MD;  Location: [facility] ENDOSCOPY;  Service: Endoscopy;  Laterality: N/A;  . COLONOSCOPY N/A 04/11/2013   Procedure: COLONOSCOPY;  Surgeon: Inda Castle, MD;  Location: [facility] ENDOSCOPY;  Service: Endoscopy;  Laterality: N/A;  . EP  IMPLANTABLE DEVICE N/A 02/17/2016   Procedure: BiV ICD Insertion CRT-D;  Surgeon: Evans Lance, MD;  Location: [place] [facility];  Service: Cardiovascular;  Laterality: N/A;  . FINGER SURGERY  2012   "4th digit right hand; thumb on left hand"  . RADIOLOGY WITH ANESTHESIA N/A 02/11/2016   Procedure: MRI OF THE BRAIN WITHOUT CONTRAST, LUMBAR WITHOUT CONTRAST;  Surgeon: Medication Radiologist, MD;  Location: [place];  Service: Radiology;  Laterality: N/A;  DR. WOOD/MRI  . Skin melanocytoma excision  2012   "above left clavicle"  . TEE WITHOUT CARDIOVERSION  03/22/2012   Procedure: TRANSESOPHAGEAL ECHOCARDIOGRAM (TEE);  Surgeon: Candee Furbish, MD;  Location: [facility] ENDOSCOPY;  Service: Cardiovascular;  Laterality: N/A;  . TEE WITHOUT CARDIOVERSION N/A 02/08/2016   Procedure: TRANSESOPHAGEAL ECHOCARDIOGRAM (TEE);  Surgeon: Lelon Perla, MD;  Location: [facility] ENDOSCOPY;  Service: Cardiovascular;  Laterality: N/A;    There were no vitals filed for this visit.      Subjective Assessment - 09/13/16 1630    Subjective Knee is stiff today, has gone up & down stairs reciprocally for 6 steps, then step to step with the cane.   Currently in Pain? No/denies   Pain Location Knee   Pain Orientation Right   Pain Descriptors / Indicators Tightness   Multiple Pain Sites No  [place] Adult PT Treatment/Exercise - 09/13/16 0001      Knee/Hip Exercises: Aerobic   Stationary Bike L3 x 6 min  warm up and concurrent review of status   Tread Mill 1.2 mph x 3 min then 1.3pmh x 7 min   Pt monitored throughout, no adverse effects     Knee/Hip Exercises: Machines for Strengthening   Total Gym Leg Press Seat 7; Bil 100# 30x, RTLE 60#  30x     Knee/Hip Exercises: Standing   Hip Flexion Stengthening;Both;2 sets;10 reps;Knee straight  3#   Hip Abduction Stengthening;Both;2 sets;10 reps;Knee straight  3#   Step Down Right;1 set;10 reps;Hand Hold: 2;Step Height: 6"                   PT Short Term Goals - 09/05/16 2108      PT SHORT TERM GOAL #1   Title independent with initial HEP    Status Achieved     PT SHORT TERM GOAL #2   Title be able to go to the gym to do the bike or elliptical for improving strength   Status Achieved     PT SHORT TERM GOAL #3   Title right knee extension AROM in sitting -30 degress due to increase strength   Status Achieved     PT SHORT TERM GOAL #4   Title pain in right leg decreased >/= 25%    Status Achieved     PT SHORT TERM GOAL #5   Title able to get in and out of car without helping his right leg due to increased strength   Status Achieved           PT Long Term Goals - 09/13/16 1632      PT LONG TERM GOAL #1   Title independent with HEP and basic gym program including understanding of  how to progress himself   Time 8   Period Weeks   Status On-going     PT LONG TERM GOAL #6   Title return to driving due to increased right lower extremity strength >/= 4-/5   Time 8   Period Weeks   Status Partially Met     PT LONG TERM GOAL  #12   TITLE The patient will be able to ambulate 5 minutes on the treadmill as needed to initiate cardiac rehab program   Time 8   Period Weeks   Status Achieved               Plan - 09/13/16 1632    Clinical Impression Statement Since last session pt has not done much on his own in regards to rehab. He does report doing more activity at home: more stairs, more walking around. without device. Weights for standing hip strength were increased and the distance for step down swas increased. All tolerated well by pt, challenging but could do with success. Pt was able to walk on the treamill at faster speed for the 10 minute duration. Pt was able to do a full step down off the first stair with UE support 10x. This was most challenging part of treatment.     Rehab Potential Good   Clinical Impairments Affecting Rehab Potential bilateral retroperitioneal  hemorrhage with femoral nerve involvement   PT Frequency 2x / week   PT Duration 8 weeks   PT Treatment/Interventions ADLs/Self Care Home Management;Cryotherapy;Electrical Stimulation;Functional mobility training;Stair training;Gait training;Ultrasound;Moist Heat;Therapeutic activities;Therapeutic exercise;Balance training;Neuromuscular re-education;Patient/family education;Passive range of motion;Manual techniques;Energy conservation;Other (comment)   PT  Next Visit Plan treadmill, increase time and incline. Quad strength; single leg stance and hip strength.  Functional strength for work/home tasks   Consulted and Agree with Plan of Care Patient      Patient will benefit from skilled therapeutic intervention in order to improve the following deficits and impairments:  Abnormal gait, Difficulty walking, Decreased endurance, Pain, Decreased activity tolerance, Decreased balance, Decreased strength, Decreased mobility  Visit Diagnosis: Muscle weakness (generalized)  Other abnormalities of gait and mobility     Problem List Patient Active Problem List   Diagnosis Date Noted  . Long term current use of amiodarone 08/07/2016  . Right ankle pain   . Pain   . Tibial pain   . Acute idiopathic gout of right ankle   . Right knee pain   . Diabetes mellitus type 2 in obese ([place])   . Labile blood pressure   . Acute blood loss anemia   . Diabetes mellitus type 2 in nonobese ([facility])   . Debility 02/22/2016  . 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 Weakness of both lower extremities   . Lower extremity weakness   . Pneumonia   . Acute lumbar back pain   . Non-traumatic rhabdomyolysis   . Retroperitoneal bleed   . Back pain   . Leg weakness, bilateral   . Paroxysmal atrial fibrillation ([place])   . Ventricular fibrillation ([place]) 02/09/2016  . Aortic aneurysm without rupture ([place]) 02/09/2016  . Aortic insufficiency 02/09/2016  . HCAP (healthcare-associated pneumonia)   .  Cardiac arrest ([place]) 02/01/2016  . Acute on chronic systolic heart failure ([place])   . Acute encephalopathy   . Thyroid activity decreased   . Acute hypoxemic respiratory failure ([place])   . Acute respiratory failure ([place])   . Encounter for central line placement   . Arrhythmia   . Altered mental status   . CKD (chronic kidney disease), stage IV ([place]) 08/06/2013    Class: Chronic  . Long term (current) use of anticoagulants 08/06/2013    Class: Chronic  . Special screening for malignant neoplasms, colon 04/11/2013  . Colon cancer screening 03/04/2013  . Chronic anticoagulation 03/04/2013  . Atrial flutter ([place]) 03/18/2012    Class: Acute  . Chronic systolic heart failure ([place]) 02/13/2012    Class: Acute  . Hypertension, accelerated 02/13/2012  . Aortic valve regurgitation, acquired 02/13/2012  . Aortic root enlargement ([place]) 02/13/2012  . Hyperlipidemia 02/13/2012  . Left bundle branch block 02/13/2012  . Obesity (BMI 30-39.9) 02/13/2012    Lakeeta Dobosz, PTA 09/13/2016, 5:08 PM  [facility] [address], [place], [place], [address] Phone: [phone]   Fax:  [phone]  Name: Eric Yarbro  MD MRN: [id number] Date of Birth: 1951/06/27

## 2016-09-15 ENCOUNTER — Encounter: Payer: Self-pay | Admitting: Physical Therapy

## 2016-09-15 ENCOUNTER — Ambulatory Visit: Payer: BC Managed Care – PPO | Admitting: Physical Therapy

## 2016-09-15 DIAGNOSIS — M6281 Muscle weakness (generalized): Secondary | ICD-10-CM

## 2016-09-15 NOTE — Therapy (Signed)
[facility] [address] [place], [place], [address] Phone: [phone]   Fax:  [phone]  Physical Therapy Treatment  Patient Details  Name: Eric Silversmith  MD MRN: [id number] Date of Birth: July 03, 1951 Referring Provider: Dr. Roe Coombs  Encounter Date: 09/15/2016      PT End of Session - 09/15/16 1026    Visit Number 42   Date for PT Re-Evaluation 10/20/16   Authorization Type BCBS   PT Start Time 1018   PT Stop Time 1102   PT Time Calculation (min) 44 min   Activity Tolerance Patient tolerated treatment well   Behavior During Therapy [facility] for tasks assessed/performed      Past Medical History:  Diagnosis Date  . CHF (congestive heart failure) ([place])   . Chronic kidney disease    kidney fx studies increased   . Chronic lower back pain   . Claustrophobia   . Dysrhythmia    "palpitations"  . Exertional dyspnea 01/2012  . Heart murmur   . Hypertension   . Hypothyroidism   . Migraine 02/13/12   "opthalmic"  . Varicose vein of leg    right    Past Surgical History:  Procedure Laterality Date  . CARDIAC CATHETERIZATION N/A 02/17/2016   Procedure: Left Heart Cath and Coronary Angiography;  Surgeon: Jettie Booze, MD;  Location: [place] [facility];  Service: Cardiovascular;  Laterality: N/A;  . CARDIOVERSION  03/22/2012   Procedure: CARDIOVERSION;  Surgeon: Candee Furbish, MD;  Location: [facility] ENDOSCOPY;  Service: Cardiovascular;  Laterality: N/A;  . CARDIOVERSION  04/19/2012   Procedure: CARDIOVERSION;  Surgeon: Sinclair Grooms, MD;  Location: [place];  Service: Cardiovascular;  Laterality: N/A;  . COLONOSCOPY N/A 04/11/2013   Procedure: COLONOSCOPY;  Surgeon: Inda Castle, MD;  Location: [facility] ENDOSCOPY;  Service: Endoscopy;  Laterality: N/A;  . COLONOSCOPY N/A 04/11/2013   Procedure: COLONOSCOPY;  Surgeon: Inda Castle, MD;  Location: [facility] ENDOSCOPY;  Service: Endoscopy;  Laterality: N/A;  . EP  IMPLANTABLE DEVICE N/A 02/17/2016   Procedure: BiV ICD Insertion CRT-D;  Surgeon: Evans Lance, MD;  Location: [place] [facility];  Service: Cardiovascular;  Laterality: N/A;  . FINGER SURGERY  2012   "4th digit right hand; thumb on left hand"  . RADIOLOGY WITH ANESTHESIA N/A 02/11/2016   Procedure: MRI OF THE BRAIN WITHOUT CONTRAST, LUMBAR WITHOUT CONTRAST;  Surgeon: Medication Radiologist, MD;  Location: [place];  Service: Radiology;  Laterality: N/A;  DR. WOOD/MRI  . Skin melanocytoma excision  2012   "above left clavicle"  . TEE WITHOUT CARDIOVERSION  03/22/2012   Procedure: TRANSESOPHAGEAL ECHOCARDIOGRAM (TEE);  Surgeon: Candee Furbish, MD;  Location: [facility] ENDOSCOPY;  Service: Cardiovascular;  Laterality: N/A;  . TEE WITHOUT CARDIOVERSION N/A 02/08/2016   Procedure: TRANSESOPHAGEAL ECHOCARDIOGRAM (TEE);  Surgeon: Lelon Perla, MD;  Location: [facility] ENDOSCOPY;  Service: Cardiovascular;  Laterality: N/A;    There were no vitals filed for this visit.      Subjective Assessment - 09/15/16 1027    Subjective Quad was sore after last session.    Currently in Pain? --  Quad and knee tightness   Multiple Pain Sites No                         [facility] Adult PT Treatment/Exercise - 09/15/16 0001      Knee/Hip Exercises: Aerobic   Tread Mill 1.3 mph x 10 min  Nustep L2-3 x 10 min no arms     Knee/Hip Exercises: Standing   Hip Flexion Stengthening;Both;2 sets;10 reps;Knee straight  3#   Hip Abduction Stengthening;Both;2 sets;10 reps;Knee straight  3#   Step Down Right;2 sets;10 reps;Hand Hold: 2;Step Height: 6"  Second set was 5 reps     Knee/Hip Exercises: Seated   Long Arc Quad AROM;Strengthening;Right;1 set;10 reps   Long Arc Quad Weight 0 lbs.                  PT Short Term Goals - 09/05/16 2108      PT SHORT TERM GOAL #1   Title independent with initial HEP    Status Achieved     PT SHORT TERM GOAL #2   Title be able to go to the gym to do the bike or  elliptical for improving strength   Status Achieved     PT SHORT TERM GOAL #3   Title right knee extension AROM in sitting -30 degress due to increase strength   Status Achieved     PT SHORT TERM GOAL #4   Title pain in right leg decreased >/= 25%    Status Achieved     PT SHORT TERM GOAL #5   Title able to get in and out of car without helping his right leg due to increased strength   Status Achieved           PT Long Term Goals - 09/13/16 1632      PT LONG TERM GOAL #1   Title independent with HEP and basic gym program including understanding of  how to progress himself   Time 8   Period Weeks   Status On-going     PT LONG TERM GOAL #6   Title return to driving due to increased right lower extremity strength >/= 4-/5   Time 8   Period Weeks   Status Partially Met     PT LONG TERM GOAL  #12   TITLE The patient will be able to ambulate 5 minutes on the treadmill as needed to initiate cardiac rehab program   Time 8   Period Weeks   Status Achieved               Plan - 09/15/16 1028    Clinical Impression Statement Today pt was able to extend his knee to where he could, but hold that position for a few seconds. Pt was able to increase his reps with the step downs, still using moderate UE to assist with the lowering.  Pt can now alk on the treadmill at 1.3 mph for 10 min.    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   PT Frequency 2x / week   PT Duration 8 weeks   PT Treatment/Interventions ADLs/Self Care Home Management;Cryotherapy;Electrical Stimulation;Functional mobility training;Stair training;Gait training;Ultrasound;Moist Heat;Therapeutic activities;Therapeutic exercise;Balance training;Neuromuscular re-education;Patient/family education;Passive range of motion;Manual techniques;Energy conservation;Other (comment)   PT Next Visit Plan MMT RT hip and take knee  extension measurement, , work on full step down on stairs, Treadmill consider increasing speed.   Consulted and Agree with Plan of Care Patient      Patient will benefit from skilled therapeutic intervention in order to improve the following deficits and impairments:  Abnormal gait, Difficulty walking, Decreased endurance, Pain, Decreased activity tolerance, Decreased balance, Decreased strength, Decreased mobility  Visit Diagnosis: Muscle weakness (generalized)     Problem List Patient  Active Problem List   Diagnosis Date Noted  . Long term current use of amiodarone 08/07/2016  . Right ankle pain   . Pain   . Tibial pain   . Acute idiopathic gout of right ankle   . Right knee pain   . Diabetes mellitus type 2 in obese ([place])   . Labile blood pressure   . Acute blood loss anemia   . Diabetes mellitus type 2 in nonobese ([facility])   . Debility 02/22/2016  . 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 Weakness of both lower extremities   . Lower extremity weakness   . Pneumonia   . Acute lumbar back pain   . Non-traumatic rhabdomyolysis   . Retroperitoneal bleed   . Back pain   . Leg weakness, bilateral   . Paroxysmal atrial fibrillation ([place])   . Ventricular fibrillation ([place]) 02/09/2016  . Aortic aneurysm without rupture ([place]) 02/09/2016  . Aortic insufficiency 02/09/2016  . HCAP (healthcare-associated pneumonia)   . Cardiac arrest ([place]) 02/01/2016  . Acute on chronic systolic heart failure ([place])   . Acute encephalopathy   . Thyroid activity decreased   . Acute hypoxemic respiratory failure ([place])   . Acute respiratory failure ([place])   . Encounter for central line placement   . Arrhythmia   . Altered mental status   . CKD (chronic kidney disease), stage IV ([place]) 08/06/2013    Class: Chronic  . Long term (current) use of anticoagulants 08/06/2013    Class: Chronic  . Special screening for malignant neoplasms, colon 04/11/2013  . Colon cancer screening  03/04/2013  . Chronic anticoagulation 03/04/2013  . Atrial flutter ([place]) 03/18/2012    Class: Acute  . Chronic systolic heart failure ([place]) 02/13/2012    Class: Acute  . Hypertension, accelerated 02/13/2012  . Aortic valve regurgitation, acquired 02/13/2012  . Aortic root enlargement ([place]) 02/13/2012  . Hyperlipidemia 02/13/2012  . Left bundle branch block 02/13/2012  . Obesity (BMI 30-39.9) 02/13/2012    Cheney Ewart,PTA 09/15/2016, 10:59 AM  [facility] [address], [place], [place], [address] Phone: [phone]   Fax:  [phone]  Name: Eric Burnham  MD MRN: [id number] Date of Birth: [birth year]

## 2016-09-21 ENCOUNTER — Ambulatory Visit: Payer: BC Managed Care – PPO | Admitting: Physical Therapy

## 2016-09-21 DIAGNOSIS — M6281 Muscle weakness (generalized): Secondary | ICD-10-CM | POA: Diagnosis not present

## 2016-09-21 DIAGNOSIS — R2689 Other abnormalities of gait and mobility: Secondary | ICD-10-CM

## 2016-09-21 DIAGNOSIS — M25561 Pain in right knee: Secondary | ICD-10-CM

## 2016-09-21 NOTE — Therapy (Signed)
[facility] [address] [place], [place], [address] Phone: [phone]   Fax:  [phone]  Physical Therapy Treatment  Patient Details  Name: Eric Shad  MD MRN: [id number] Date of Birth: 11-06-1950 Referring Provider: Dr. Roe Coombs  Encounter Date: 09/21/2016      PT End of Session - 09/21/16 1800    Visit Number 43   Date for PT Re-Evaluation 10/20/16   Authorization Type BCBS   PT Start Time 1454   PT Stop Time 1535   PT Time Calculation (min) 41 min   Activity Tolerance Patient tolerated treatment well      Past Medical History:  Diagnosis Date  . CHF (congestive heart failure) ([place])   . Chronic kidney disease    kidney fx studies increased   . Chronic lower back pain   . Claustrophobia   . Dysrhythmia    "palpitations"  . Exertional dyspnea 01/2012  . Heart murmur   . Hypertension   . Hypothyroidism   . Migraine 02/13/12   "opthalmic"  . Varicose vein of leg    right    Past Surgical History:  Procedure Laterality Date  . CARDIAC CATHETERIZATION N/A 02/17/2016   Procedure: Left Heart Cath and Coronary Angiography;  Surgeon: Jettie Booze, MD;  Location: [place] [facility];  Service: Cardiovascular;  Laterality: N/A;  . CARDIOVERSION  03/22/2012   Procedure: CARDIOVERSION;  Surgeon: Candee Furbish, MD;  Location: [facility] ENDOSCOPY;  Service: Cardiovascular;  Laterality: N/A;  . CARDIOVERSION  04/19/2012   Procedure: CARDIOVERSION;  Surgeon: Sinclair Grooms, MD;  Location: [place];  Service: Cardiovascular;  Laterality: N/A;  . COLONOSCOPY N/A 04/11/2013   Procedure: COLONOSCOPY;  Surgeon: Inda Castle, MD;  Location: [facility] ENDOSCOPY;  Service: Endoscopy;  Laterality: N/A;  . COLONOSCOPY N/A 04/11/2013   Procedure: COLONOSCOPY;  Surgeon: Inda Castle, MD;  Location: [facility] ENDOSCOPY;  Service: Endoscopy;  Laterality: N/A;  . EP IMPLANTABLE DEVICE N/A 02/17/2016   Procedure: BiV ICD Insertion  CRT-D;  Surgeon: Evans Lance, MD;  Location: [place] [facility];  Service: Cardiovascular;  Laterality: N/A;  . FINGER SURGERY  2012   "4th digit right hand; thumb on left hand"  . RADIOLOGY WITH ANESTHESIA N/A 02/11/2016   Procedure: MRI OF THE BRAIN WITHOUT CONTRAST, LUMBAR WITHOUT CONTRAST;  Surgeon: Medication Radiologist, MD;  Location: [place];  Service: Radiology;  Laterality: N/A;  DR. WOOD/MRI  . Skin melanocytoma excision  2012   "above left clavicle"  . TEE WITHOUT CARDIOVERSION  03/22/2012   Procedure: TRANSESOPHAGEAL ECHOCARDIOGRAM (TEE);  Surgeon: Candee Furbish, MD;  Location: [facility] ENDOSCOPY;  Service: Cardiovascular;  Laterality: N/A;  . TEE WITHOUT CARDIOVERSION N/A 02/08/2016   Procedure: TRANSESOPHAGEAL ECHOCARDIOGRAM (TEE);  Surgeon: Lelon Perla, MD;  Location: [facility] ENDOSCOPY;  Service: Cardiovascular;  Laterality: N/A;    There were no vitals filed for this visit.      Subjective Assessment - 09/21/16 1458    Subjective Arrives 14 min late.   My son could tell I had made progress since he saw me Thanksgiving.  I practice going up and down the steps with him several times.  Descending steps is more difficult at home (carpeted).  Worked this morning.     Currently in Pain? No/denies   Pain Score 0-No pain  just tightness   Pain Location Knee   Pain Orientation Right   Pain Type Chronic pain  [facility] Adult PT Treatment/Exercise - 09/21/16 0001      Knee/Hip Exercises: Aerobic   Tread Mill 1.3 6 min; 1.3 incline 5% level 2 4 min   Nustep L4 8 min arms only     Knee/Hip Exercises: Standing   Forward Step Up Right;3 sets;Hand Hold: 2;Hand Hold: 1;Step Height: 6"   Step Down Right;10 reps;Hand Hold: 2;Step Height: 6"  significant UE support   Step Down Limitations 10x 2 inch with min 2 hand support   Other Standing Knee Exercises 3# right hip flexion 10x   Other Standing Knee Exercises 3# right hip abduction 10x     Knee/Hip  Exercises: Seated   Long Arc Quad AROM;Strengthening;Right;1 set;10 reps   Other Seated Knee/Hip Exercises yellow band knee extension small range 10x   Other Seated Knee/Hip Exercises yellow band right hip flexion 10x yellow band                  PT Short Term Goals - 09/21/16 1804      PT SHORT TERM GOAL #1   Title independent with initial HEP    Status Achieved     PT SHORT TERM GOAL #2   Title be able to go to the gym to do the bike or elliptical for improving strength   Status Achieved     PT SHORT TERM GOAL #3   Title right knee extension AROM in sitting -30 degress due to increase strength   Status Achieved     PT SHORT TERM GOAL #4   Title pain in right leg decreased >/= 25%    Status Achieved     PT SHORT TERM GOAL #5   Title able to get in and out of car without helping his right leg due to increased strength   Status Achieved           PT Long Term Goals - 09/21/16 1805      PT LONG TERM GOAL #1   Title independent with HEP and basic gym program including understanding of  how to progress himself   Time 8   Period Weeks   Status On-going     PT LONG TERM GOAL #2   Title able to stand for 25 min with pain decreased >/= 50% so he is able to cook a meal.   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   Status Achieved     PT LONG TERM GOAL #5   Title return to household chores due to lower extremity strength >/= 4/5   Time 8   Period Weeks   Status On-going     PT LONG TERM GOAL #6   Title return to driving due to increased right lower extremity strength >/= 4-/5   Time 8   Period Weeks   Status Partially Met     PT LONG TERM GOAL #7   Title pain decreased >/= 50% in right knee   Status Achieved     PT LONG TERM GOAL #8   Title The patient will be able to ambulate  with SPC on the incline (and decline) of his driveway to check his mailbox   Time 8   Period Weeks   Status On-going  PT LONG TERM GOAL  #9   TITLE The patient will be able to ascend and descend full  flight of steps at home reciprocally with cane and 1 railing   Time 8   Period Weeks   Status On-going     PT LONG TERM GOAL  #10   TITLE The patient will have improved right LE strength needed to get in/out of the bathtub using UE assist /bars as needed   Time 8   Period Weeks   Status On-going     PT LONG TERM GOAL  #11   TITLE The patient will have the right hip strength to 4+/5 needed to elevate and lower stool in the operating room   Time 8   Period Weeks   Status On-going     PT LONG TERM GOAL  #12   TITLE The patient will be able to ambulate 5 minutes on the treadmill as needed to initiate cardiac rehab program   Status Achieved               Plan - 09/21/16 1801    Clinical Impression Statement The patient has improved knee extension in sitting with 10 degree lag.  Improving quad motor control but eccentric lowering with descending the stairs continues to require significant UE use on railings.  Improving endurance on the treadmill including walking on incline.  Therapist closely monitoring response for safety.     PT Next Visit Plan  work on full step down on stairs, Treadmill consider increasing speed and intervals of incline      Patient will benefit from skilled therapeutic intervention in order to improve the following deficits and impairments:     Visit Diagnosis: Muscle weakness (generalized)  Other abnormalities of gait and mobility  Right knee pain, unspecified chronicity     Problem List Patient Active Problem List   Diagnosis Date Noted  . Long term current use of amiodarone 08/07/2016  . Right ankle pain   . Pain   . Tibial pain   . Acute idiopathic gout of right ankle   . Right knee pain   . Diabetes mellitus type 2 in obese ([place])    . Labile blood pressure   . Acute blood loss anemia   . Diabetes mellitus type 2 in nonobese ([facility])   . Debility 02/22/2016  . 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 Weakness of both lower extremities   . Lower extremity weakness   . Pneumonia   . Acute lumbar back pain   . Non-traumatic rhabdomyolysis   . Retroperitoneal bleed   . Back pain   . Leg weakness, bilateral   . Paroxysmal atrial fibrillation ([place])   . Ventricular fibrillation ([place]) 02/09/2016  . Aortic aneurysm without rupture ([place]) 02/09/2016  . Aortic insufficiency 02/09/2016  . HCAP (healthcare-associated pneumonia)   . Cardiac arrest ([place]) 02/01/2016  . Acute on chronic systolic heart failure ([place])   . Acute encephalopathy   . Thyroid activity decreased   . Acute hypoxemic respiratory failure ([place])   . Acute respiratory failure ([place])   . Encounter for central line placement   . Arrhythmia   . Altered mental status   . CKD (chronic kidney disease), stage IV ([place]) 08/06/2013    Class: Chronic  . Long term (current) use of anticoagulants 08/06/2013    Class: Chronic  . Special screening for malignant neoplasms, colon 04/11/2013  . Colon cancer screening 03/04/2013  . Chronic anticoagulation 03/04/2013  .  Atrial flutter ([place]) 03/18/2012    Class: Acute  . Chronic systolic heart failure ([place]) 02/13/2012    Class: Acute  . Hypertension, accelerated 02/13/2012  . Aortic valve regurgitation, acquired 02/13/2012  . Aortic root enlargement ([place]) 02/13/2012  . Hyperlipidemia 02/13/2012  . Left bundle branch block 02/13/2012  . Obesity (BMI 30-39.9) 02/13/2012   Ruben Im, PT 09/21/16 6:07 PM Phone: [phone] Fax: [phone]  Alvera Singh 09/21/2016, 6:07 PM  [facility] [address] [place], [place], [address] Phone: [phone]   Fax:  [phone]  Name: Eric Arment  MD MRN: [id number] Date of Birth:  08-06-1951

## 2016-09-27 ENCOUNTER — Encounter: Payer: BC Managed Care – PPO | Admitting: Physical Therapy

## 2016-09-28 ENCOUNTER — Ambulatory Visit: Payer: BC Managed Care – PPO | Attending: Internal Medicine | Admitting: Physical Therapy

## 2016-09-28 ENCOUNTER — Encounter: Payer: Self-pay | Admitting: Physical Therapy

## 2016-09-28 DIAGNOSIS — M25561 Pain in right knee: Secondary | ICD-10-CM | POA: Diagnosis present

## 2016-09-28 DIAGNOSIS — M6281 Muscle weakness (generalized): Secondary | ICD-10-CM | POA: Insufficient documentation

## 2016-09-28 DIAGNOSIS — G8929 Other chronic pain: Secondary | ICD-10-CM | POA: Insufficient documentation

## 2016-09-28 DIAGNOSIS — R2689 Other abnormalities of gait and mobility: Secondary | ICD-10-CM | POA: Diagnosis present

## 2016-09-28 NOTE — Therapy (Signed)
[facility] [address], [place], [place], [address] Phone: [phone]   Fax:  [phone]  Physical Therapy Treatment  Patient Details  Name: Eric Colee  MD MRN: [id number] Date of Birth: 1951/02/23 Referring Provider: Dr. Roe Coombs  Encounter Date: 09/28/2016      PT End of Session - 09/28/16 1547    Visit Number 72   Date for PT Re-Evaluation 10/20/16   Authorization Type BCBS   PT Start Time 1542   PT Stop Time 1621   PT Time Calculation (min) 39 min   Activity Tolerance Patient tolerated treatment well   Behavior During Therapy [facility] for tasks assessed/performed      Past Medical History:  Diagnosis Date  . CHF (congestive heart failure) ([place])   . Chronic kidney disease    kidney fx studies increased   . Chronic lower back pain   . Claustrophobia   . Dysrhythmia    "palpitations"  . Exertional dyspnea 01/2012  . Heart murmur   . Hypertension   . Hypothyroidism   . Migraine 02/13/12   "opthalmic"  . Varicose vein of leg    right    Past Surgical History:  Procedure Laterality Date  . CARDIAC CATHETERIZATION N/A 02/17/2016   Procedure: Left Heart Cath and Coronary Angiography;  Surgeon: Jettie Booze, MD;  Location: [place] [place] [facility];  Service: Cardiovascular;  Laterality: N/A;  . CARDIOVERSION  03/22/2012   Procedure: CARDIOVERSION;  Surgeon: Candee Furbish, MD;  Location: [facility] ENDOSCOPY;  Service: Cardiovascular;  Laterality: N/A;  . CARDIOVERSION  04/19/2012   Procedure: CARDIOVERSION;  Surgeon: Sinclair Grooms, MD;  Location: [place];  Service: Cardiovascular;  Laterality: N/A;  . COLONOSCOPY N/A 04/11/2013   Procedure: COLONOSCOPY;  Surgeon: Inda Castle, MD;  Location: [facility]OS[facility];  Service: Endoscopy;  Laterality: N/A;  . COLONOSCOPY N/A 04/11/2013   Procedure: COLONOSCOPY;  Surgeon: Inda Castle, MD;  Location: [facility] ENDOS[facility];  Service: Endoscopy;  Laterality: N/A;  . EP  IMPLANTABLE DEVICE N/A 02/17/2016   Procedure: BiV ICD Insertion CRT-D;  Surgeon: Evans Lance, MD;  Location: [place] [facility];  Service: Cardiovascular;  Laterality: N/A;  . FINGER SURGERY  2012   "4th digit right hand; thumb on left hand"  . RADIOLOGY WITH ANESTHESIA N/A 02/11/2016   Procedure: MRI OF THE BRAIN WITHOUT CONTRAST, LUMBAR WITHOUT CONTRAST;  Surgeon: Medication Radiologist, MD;  Location: [place] [place];  Service: Radiology;  Laterality: N/A;  DR. WOOD/MRI  . Skin melanocytoma excision  2012   "above left clavicle"  . TEE WITHOUT CARDIOVERSION  03/22/2012   Procedure: TRANSESOPHAGEAL ECHOCARDIOGRAM (TEE);  Surgeon: Candee Furbish, MD;  Location[facility] ENDOSCOPY;  Service: Cardiovascular;  Laterality: N/A;  . TEE WITHOUT CARDIOVERSION N/A 02/08/2016   Procedure: TRANSESOPHAGEAL ECHOCARDIOGRAM (TEE);  Surgeon: Lelon Perla, MD;  Location: [facility] ENDOSCOPY;  Service: Cardiovascular;  Laterality: N/A;    There were no vitals filed for this visit.      Subjective Assessment - 09/28/16 1544    Subjective Reports some tightness in Rt knee. Strained Lt wrist a few minutes prior to therapy while moveing boxes.   Patient is accompained by: Family member   How long can you sit comfortably? sitting for 45 min and right leg gets numb   How long can you stand comfortably? without walker 60 sec.    How long can you walk comfortably? 30 min    Patient Stated Goals regain previous  mobility.  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 No/denies   Pain Score --  Tightness                         [facility] Adult PT Treatment/Exercise - 09/28/16 0001      Knee/Hip Exercises: Aerobic   Tread Mill 1.5 x 23min; 1.3 incline 5%    Nustep L4 8 min arms only     Knee/Hip Exercises: Standing   Hip Flexion Stengthening;2 sets;10 reps;Knee straight;Right  3#   Hip Abduction Stengthening;2 sets;10 reps;Knee  straight;Right  3#   Forward Step Up Right;3 sets;Hand Hold: 2;Hand Hold: 1;Step Height: 6"   Step Down Limitations 10x 2 inch with 1 hand support   Other Standing Knee Exercises 3# right hip flexion 10x   Other Standing Knee Exercises 3# right hip abduction 10x     Knee/Hip Exercises: Seated   Long Arc Quad AROM;Strengthening;Right;10 reps;2 sets                  PT Short Term Goals - 09/21/16 1804      PT SHORT TERM GOAL #1   Title independent with initial HEP    Status Achieved     PT SHORT TERM GOAL #2   Title be able to go to the gym to do the bike or elliptical for improving strength   Status Achieved     PT SHORT TERM GOAL #3   Title right knee extension AROM in sitting -30 degress due to increase strength   Status Achieved     PT SHORT TERM GOAL #4   Title pain in right leg decreased >/= 25%    Status Achieved     PT SHORT TERM GOAL #5   Title able to get in and out of car without helping his right leg due to increased strength   Status Achieved           PT Long Term Goals - 09/28/16 1614      PT LONG TERM GOAL #1   Title independent with HEP and basic gym program including understanding of  how to progress himself   Time 8   Period Weeks   Status On-going     PT LONG TERM GOAL  #9   TITLE The patient will be able to ascend and descend full  flight of steps at home reciprocally with cane and 1 railing   Time 8   Period Weeks   Status On-going     PT LONG TERM GOAL  #10   TITLE The patient will have improved right LE strength needed to get in/out of the bathtub using UE assist /bars as needed   Time 8   Period Weeks   Status On-going               Plan - 09/28/16 1547    Clinical Impression Statement Pt did well with all exercise. Able to increase speed on tread mill to 1.5. Pt will continue to benefit from skilled therapy for LE strengthening and endurance.    Rehab Potential Good   Clinical Impairments Affecting Rehab  Potential bilateral retroperitioneal hemorrhage with femoral nerve involvement   PT Frequency 2x / week   PT Duration 8 weeks   PT Treatment/Interventions ADLs/Self Care Home Management;Cryotherapy;Electrical Stimulation;Functional mobility training;Stair training;Gait training;Ultrasound;Moist Heat;Therapeutic activities;Therapeutic exercise;Balance training;Neuromuscular re-education;Patient/family education;Passive range of  motion;Manual techniques;Energy conservation;Other (comment)   PT Next Visit Plan  work on full step down on stairs, Treadmill consider increasing speed and intervals of incline   Consulted and Agree with Plan of Care Patient      Patient will benefit from skilled therapeutic intervention in order to improve the following deficits and impairments:  Abnormal gait, Difficulty walking, Decreased endurance, Pain, Decreased activity tolerance, Decreased balance, Decreased strength, Decreased mobility  Visit Diagnosis: Muscle weakness (generalized)  Other abnormalities of gait and mobility  Right knee pain, unspecified chronicity     Problem List Patient Active Problem List   Diagnosis Date Noted  . Long term current use of amiodarone 08/07/2016  . Right ankle pain   . Pain   . Tibial pain   . Acute idiopathic gout of right ankle   . Right knee pain   . Diabetes mellitus type 2 in obese ([place])   . Labile blood pressure   . Acute blood loss anemia   . Diabetes mellitus type 2 in nonobese ([facility])   . Debility 02/22/2016  . 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 Weakness of both lower extremities   . Lower extremity weakness   . Pneumonia   . Acute lumbar back pain   . Non-traumatic rhabdomyolysis   . Retroperitoneal bleed   . Back pain   . Leg weakness, bilateral   . Paroxysmal atrial fibrillation ([place])   . Ventricular fibrillation ([place]) 02/09/2016  . Aortic aneurysm without rupture ([place]) 02/09/2016  . Aortic insufficiency 02/09/2016  .  HCAP (healthcare-associated pneumonia)   . Cardiac arrest ([place]) 02/01/2016  . Acute on chronic systolic heart failure ([place])   . Acute encephalopathy   . Thyroid activity decreased   . Acute hypoxemic respiratory failure ([place])   . Acute respiratory failure ([place])   . Encounter for central line placement   . Arrhythmia   . Altered mental status   . CKD (chronic kidney disease), stage IV ([place]) 08/06/2013    Class: Chronic  . Long term (current) use of anticoagulants 08/06/2013    Class: Chronic  . Special screening for malignant neoplasms, colon 04/11/2013  . Colon cancer screening 03/04/2013  . Chronic anticoagulation 03/04/2013  . Atrial flutter ([place]) 03/18/2012    Class: Acute  . Chronic systolic heart failure ([place]) 02/13/2012    Class: Acute  . Hypertension, accelerated 02/13/2012  . Aortic valve regurgitation, acquired 02/13/2012  . Aortic root enlargement ([place]) 02/13/2012  . Hyperlipidemia 02/13/2012  . Left bundle branch block 02/13/2012  . Obesity (BMI 30-39.9) 02/13/2012    Mikle Bosworth PTA 09/28/2016, 4:52 PM  [facility] [address] [place], [place], [address] Phone: [phone]   Fax:  [phone]  Name: Eric Zenon  MD MRN: [id number] Date of Birth: 1951-08-07

## 2016-09-29 ENCOUNTER — Encounter: Payer: BC Managed Care – PPO | Admitting: Physical Therapy

## 2016-10-03 ENCOUNTER — Ambulatory Visit: Payer: BC Managed Care – PPO | Admitting: Physical Therapy

## 2016-10-03 ENCOUNTER — Encounter: Payer: Self-pay | Admitting: Physical Therapy

## 2016-10-03 DIAGNOSIS — G8929 Other chronic pain: Secondary | ICD-10-CM

## 2016-10-03 DIAGNOSIS — M6281 Muscle weakness (generalized): Secondary | ICD-10-CM | POA: Diagnosis not present

## 2016-10-03 DIAGNOSIS — R2689 Other abnormalities of gait and mobility: Secondary | ICD-10-CM

## 2016-10-03 DIAGNOSIS — M25561 Pain in right knee: Secondary | ICD-10-CM

## 2016-10-03 NOTE — Therapy (Signed)
[facility] [address], [place], [place], [address] Phone: [phone]   Fax:  [phone]  Physical Therapy Treatment  Patient Details  Name: Eric Byard  MD MRN: [id number] Date of Birth: 12/31/1950 Referring Provider: Dr. Roe Coombs  Encounter Date: 10/03/2016      PT End of Session - 10/03/16 1542    Visit Number 66   Date for PT Re-Evaluation 10/20/16   Authorization Type BCBS   PT Start Time 1537   PT Stop Time 1622   PT Time Calculation (min) 45 min   Activity Tolerance Patient tolerated treatment well   Behavior During Therapy [facility] for tasks assessed/performed      Past Medical History:  Diagnosis Date  . CHF (congestive heart failure) ([place])   . Chronic kidney disease    kidney fx studies increased   . Chronic lower back pain   . Claustrophobia   . Dysrhythmia    "palpitations"  . Exertional dyspnea 01/2012  . Heart murmur   . Hypertension   . Hypothyroidism   . Migraine 02/13/12   "opthalmic"  . Varicose vein of leg    right    Past Surgical History:  Procedure Laterality Date  . CARDIAC CATHETERIZATION N/A 02/17/2016   Procedure: Left Heart Cath and Coronary Angiography;  Surgeon: Jettie Booze, MD;  Location: [place] [facility];  Service: Cardiovascular;  Laterality: N/A;  . CARDIOVERSION  03/22/2012   Procedure: CARDIOVERSION;  Surgeon: Candee Furbish, MD;  Location: [facility] ENDOSCOPY;  Service: Cardiovascular;  Laterality: N/A;  . CARDIOVERSION  04/19/2012   Procedure: CARDIOVERSION;  Surgeon: Sinclair Grooms, MD;  Location: [place];  Service: Cardiovascular;  Laterality: N/A;  . COLONOSCOPY N/A 04/11/2013   Procedure: COLONOSCOPY;  Surgeon: Inda Castle, MD;  Location: [facility] ENDOSCOPY;  Service: Endoscopy;  Laterality: N/A;  . COLONOSCOPY N/A 04/11/2013   Procedure: COLONOSCOPY;  Surgeon: Inda Castle, MD;  Location: [facility] ENDOSCOPY;  Service: Endoscopy;  Laterality: N/A;  . EP  IMPLANTABLE DEVICE N/A 02/17/2016   Procedure: BiV ICD Insertion CRT-D;  Surgeon: Evans Lance, MD;  Location: [place] [facility];  Service: Cardiovascular;  Laterality: N/A;  . FINGER SURGERY  2012   "4th digit right hand; thumb on left hand"  . RADIOLOGY WITH ANESTHESIA N/A 02/11/2016   Procedure: MRI OF THE BRAIN WITHOUT CONTRAST, LUMBAR WITHOUT CONTRAST;  Surgeon: Medication Radiologist, MD;  Location: [place];  Service: Radiology;  Laterality: N/A;  DR. WOOD/MRI  . Skin melanocytoma excision  2012   "above left clavicle"  . TEE WITHOUT CARDIOVERSION  03/22/2012   Procedure: TRANSESOPHAGEAL ECHOCARDIOGRAM (TEE);  Surgeon: Candee Furbish, MD;  Location: [facility] ENDOSCOPY;  Service: Cardiovascular;  Laterality: N/A;  . TEE WITHOUT CARDIOVERSION N/A 02/08/2016   Procedure: TRANSESOPHAGEAL ECHOCARDIOGRAM (TEE);  Surgeon: Lelon Perla, MD;  Location: [facility] ENDOSCOPY;  Service: Cardiovascular;  Laterality: N/A;    There were no vitals filed for this visit.      Subjective Assessment - 10/03/16 1539    Subjective Pt reports tightness Rt thigh.    Patient is accompained by: Family member   How long can you sit comfortably? sitting for 45 min and right leg gets numb   How long can you stand comfortably? without walker 60 sec.    How long can you walk comfortably? 30 min    Patient Stated Goals regain previous mobility.  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 cooking, making bed, washing dishes    Currently in Pain? No/denies                         [facility] Adult PT Treatment/Exercise - 10/03/16 0001      Knee/Hip Exercises: Stretches   Sports administrator Right;3 reps;10 seconds  on stairs     Knee/Hip Exercises: Aerobic   Tread Mill 1.5 x 44mn; 1.3 incline 5%    Nustep L4 8 min legs only  Therapist present to discuss treatment     Knee/Hip Exercises: Standing   Lateral Step Up Right;3 sets;10 reps;Hand Hold: 1;Step Height: 4"   Forward Step Up Right;3  sets;Hand Hold: 2;Hand Hold: 1;Step Height: 6"   Other Standing Knee Exercises 4 direction hip kicks  weight bearing on Rt, Lt hip kicking     Knee/Hip Exercises: Seated   Long Arc Quad --                  PT Short Term Goals - 09/21/16 1804      PT SHORT TERM GOAL #1   Title independent with initial HEP    Status Achieved     PT SHORT TERM GOAL #2   Title be able to go to the gym to do the bike or elliptical for improving strength   Status Achieved     PT SHORT TERM GOAL #3   Title right knee extension AROM in sitting -30 degress due to increase strength   Status Achieved     PT SHORT TERM GOAL #4   Title pain in right leg decreased >/= 25%    Status Achieved     PT SHORT TERM GOAL #5   Title able to get in and out of car without helping his right leg due to increased strength   Status Achieved           PT Long Term Goals - 10/03/16 1543      PT LONG TERM GOAL #5   Title return to household chores due to lower extremity strength >/= 4/5   Baseline Unable to take out trash due to inclined drive way   Time 8   Period Weeks   Status Partially Met     PT LONG TERM GOAL #6   Title return to driving due to increased right lower extremity strength >/= 4-/5   Time 8   Period Weeks   Status Partially Met               Plan - 10/03/16 1626    Clinical Impression Statement Pt tolerated all exercises well. Able to tolerate treadmill well. Some difficulty maintaining Rt knee stability with resisted hip kicks but able to complete. Pt will continue to benefit from skillde therapy to progress towards goals and return to daily activities with maximal effeciency and decreased pain.    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   PT Frequency 2x / week   PT Duration 8 weeks   PT Treatment/Interventions ADLs/Self Care Home Management;Cryotherapy;Electrical  Stimulation;Functional mobility training;Stair training;Gait training;Ultrasound;Moist Heat;Therapeutic activities;Therapeutic exercise;Balance training;Neuromuscular re-education;Patient/family education;Passive range of motion;Manual techniques;Energy conservation;Other (comment)   PT Next Visit Plan Continue to progress Rt knee stability, slowly increase incline on treadmill   Consulted and Agree with Plan of Care Patient      Patient will benefit from skilled therapeutic intervention in order to improve the following  deficits and impairments:  Abnormal gait, Difficulty walking, Decreased endurance, Pain, Decreased activity tolerance, Decreased balance, Decreased strength, Decreased mobility  Visit Diagnosis: Muscle weakness (generalized)  Other abnormalities of gait and mobility  Right knee pain, unspecified chronicity  Chronic pain of right knee     Problem List Patient Active Problem List   Diagnosis Date Noted  . Long term current use of amiodarone 08/07/2016  . Right ankle pain   . Pain   . Tibial pain   . Acute idiopathic gout of right ankle   . Right knee pain   . Diabetes mellitus type 2 in obese ([place])   . Labile blood pressure   . Acute blood loss anemia   . Diabetes mellitus type 2 in nonobese (H[facility])   . Debility 02/22/2016  . 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 Weakness of both lower extremities   . Lower extremity weakness   . Pneumonia   . Acute lumbar back pain   . Non-traumatic rhabdomyolysis   . Retroperitoneal bleed   . Back pain   . Leg weakness, bilateral   . Paroxysmal atrial fibrillation ([place])   . Ventricular fibrillation ([place]) 02/09/2016  . Aortic aneurysm without rupture ([place]) 02/09/2016  . Aortic insufficiency 02/09/2016  . HCAP (healthcare-associated pneumonia)   . Cardiac arrest ([place]) 02/01/2016  . Acute on chronic systolic heart failure ([place])   . Acute encephalopathy   . Thyroid activity decreased   . Acute  hypoxemic respiratory failure ([place])   . Acute respiratory failure ([place])   . Encounter for central line placement   . Arrhythmia   . Altered mental status   . CKD (chronic kidney disease), stage IV ([place]) 08/06/2013    Class: Chronic  . Long term (current) use of anticoagulants 08/06/2013    Class: Chronic  . Special screening for malignant neoplasms, colon 04/11/2013  . Colon cancer screening 03/04/2013  . Chronic anticoagulation 03/04/2013  . Atrial flutter ([place]) 03/18/2012    Class: Acute  . Chronic systolic heart failure ([place]) 02/13/2012    Class: Acute  . Hypertension, accelerated 02/13/2012  . Aortic valve regurgitation, acquired 02/13/2012  . Aortic root enlargement ([place]) 02/13/2012  . Hyperlipidemia 02/13/2012  . Left bundle branch block 02/13/2012  . Obesity (BMI 30-39.9) 02/13/2012    Mikle Bosworth PTA 10/03/2016, 4:28 PM  [facility] [address] [place], [place], [address] Phone: [phone]   Fax:  [phone]  Name: Eric Mukai  MD MRN: [id number] Date of Birth: 01/06/1951

## 2016-10-04 ENCOUNTER — Encounter: Payer: BC Managed Care – PPO | Admitting: Physical Therapy

## 2016-10-06 ENCOUNTER — Encounter: Payer: BC Managed Care – PPO | Admitting: Physical Therapy

## 2016-10-09 ENCOUNTER — Encounter: Payer: BC Managed Care – PPO | Admitting: Physical Therapy

## 2016-10-11 ENCOUNTER — Ambulatory Visit: Payer: BC Managed Care – PPO | Admitting: Physical Therapy

## 2016-10-13 ENCOUNTER — Encounter: Payer: BC Managed Care – PPO | Admitting: Physical Therapy

## 2016-10-17 ENCOUNTER — Ambulatory Visit: Payer: BC Managed Care – PPO | Admitting: Physical Therapy

## 2016-10-17 DIAGNOSIS — M6281 Muscle weakness (generalized): Secondary | ICD-10-CM

## 2016-10-17 DIAGNOSIS — R2689 Other abnormalities of gait and mobility: Secondary | ICD-10-CM

## 2016-10-17 DIAGNOSIS — M25561 Pain in right knee: Secondary | ICD-10-CM

## 2016-10-17 NOTE — Therapy (Signed)
[facility] [address] [place], [place], [address] Phone: [phone]   Fax:  [phone]  Physical Therapy Treatment  Patient Details  Name: Eric Pelley  MD MRN: [id number] Date of Birth: 05-08-1951 Referring Provider: Dr. Roe Coombs  Encounter Date: 10/17/2016      PT End of Session - 10/17/16 1652    Visit Number 85   Date for PT Re-Evaluation 10/20/16   Authorization Type BCBS   PT Start Time 1622   PT Stop Time 1703   PT Time Calculation (min) 41 min   Activity Tolerance Patient tolerated treatment well      Past Medical History:  Diagnosis Date  . CHF (congestive heart failure) ([place])   . Chronic kidney disease    kidney fx studies increased   . Chronic lower back pain   . Claustrophobia   . Dysrhythmia    "palpitations"  . Exertional dyspnea 01/2012  . Heart murmur   . Hypertension   . Hypothyroidism   . Migraine 02/13/12   "opthalmic"  . Varicose vein of leg    right    Past Surgical History:  Procedure Laterality Date  . CARDIAC CATHETERIZATION N/A 02/17/2016   Procedure: Left Heart Cath and Coronary Angiography;  Surgeon: Jettie Booze, MD;  Location: [place] [facility];  Service: Cardiovascular;  Laterality: N/A;  . CARDIOVERSION  03/22/2012   Procedure: CARDIOVERSION;  Surgeon: Candee Furbish, MD;  Location: [facility] ENDOSCOPY;  Service: Cardiovascular;  Laterality: N/A;  . CARDIOVERSION  04/19/2012   Procedure: CARDIOVERSION;  Surgeon: Sinclair Grooms, MD;  Location: [place];  Service: Cardiovascular;  Laterality: N/A;  . COLONOSCOPY N/A 04/11/2013   Procedure: COLONOSCOPY;  Surgeon: Inda Castle, MD;  Location: [facility];  Service: Endoscopy;  Laterality: N/A;  . COLONOSCOPY N/A 04/11/2013   Procedure: COLONOSCOPY;  Surgeon: Inda Castle, MD;  Location: [facility]OS[facility];  Service: Endoscopy;  Laterality: N/A;  . EP IMPLANTABLE DEVICE N/A 02/17/2016   Procedure: BiV ICD Insertion  CRT-D;  Surgeon: Evans Lance, MD;  Location: [place] [facility];  Service: Cardiovascular;  Laterality: N/A;  . FINGER SURGERY  2012   "4th digit right hand; thumb on left hand"  . RADIOLOGY WITH ANESTHESIA N/A 02/11/2016   Procedure: MRI OF THE BRAIN WITHOUT CONTRAST, LUMBAR WITHOUT CONTRAST;  Surgeon: Medication Radiologist, MD;  Location: [place];  Service: Radiology;  Laterality: N/A;  DR. WOOD/MRI  . Skin melanocytoma excision  2012   "above left clavicle"  . TEE WITHOUT CARDIOVERSION  03/22/2012   Procedure: TRANSESOPHAGEAL ECHOCARDIOGRAM (TEE);  Surgeon: Candee Furbish, MD;  Location: [facility] ENDOSCOPY;  Service: Cardiovascular;  Laterality: N/A;  . TEE WITHOUT CARDIOVERSION N/A 02/08/2016   Procedure: TRANSESOPHAGEAL ECHOCARDIOGRAM (TEE);  Surgeon: Lelon Perla, MD;  Location: [facility]OSCOPY;  Service: Cardiovascular;  Laterality: N/A;    There were no vitals filed for this visit.      Subjective Assessment - 10/17/16 1624    Subjective Doing OK.  Working some.  Hasn't been here in a week b/c of the snow.  States going down steps is a struggle (unable to alternate feet).  I can alternate feet going up steps now.  I haven't done a big incline yet.  I have done more driving and that's gone OK.     Currently in Pain? Yes   Pain Score 2    Pain Location Knee  [facility] Adult PT Treatment/Exercise - 10/17/16 0001      Ambulation/Gait   Gait Comments outdoor incline/decline with cane 2x with contact-guard assist     Knee/Hip Exercises: Aerobic   Stationary Bike L3 6 min   Tread Mill 1.5 x 50min; 1.0 incline 10% incline   Nustep L4 8 min legs only  Therapist present to discuss treatment     Knee/Hip Exercises: Standing   Step Down Left;2 sets;10 reps;Hand Hold: 2;Step Height: 4"   Walking with Sports Cord 30# forward 10x;  25# backward 10x                  PT Short Term Goals - 10/17/16 1702      PT SHORT TERM GOAL #1   Title  independent with initial HEP    Status Achieved     PT SHORT TERM GOAL #2   Title be able to go to the gym to do the bike or elliptical for improving strength   Status Achieved     PT SHORT TERM GOAL #3   Title right knee extension AROM in sitting -30 degress due to increase strength   Status Achieved     PT SHORT TERM GOAL #4   Title pain in right leg decreased >/= 25%    Status Achieved     PT SHORT TERM GOAL #5   Title able to get in and out of car without helping his right leg due to increased strength   Status Achieved           PT Long Term Goals - 10/17/16 1703      PT LONG TERM GOAL #1   Title independent with HEP and basic gym program including understanding of  how to progress himself   Time 8   Period Weeks   Status On-going     PT LONG TERM GOAL #2   Title able to stand for 25 min with pain decreased >/= 50% so he is able to cook a meal.     PT LONG TERM GOAL #3   Title going up and down stairs with step to step with a cane   Status Achieved     PT LONG TERM GOAL #4   Title sit on stool and scoot around office to assess patients   Status Achieved     PT LONG TERM GOAL #5   Title return to household chores due to lower extremity strength >/= 4/5   Time 8   Period Weeks   Status New     PT LONG TERM GOAL #6   Title return to driving due to increased right lower extremity strength >/= 4-/5   Time 8   Period Weeks     PT LONG TERM GOAL #7   Title pain decreased >/= 50% in right knee   Status Achieved     PT LONG TERM GOAL #8   Title The patient will be able to ambulate with SPC on the incline (and decline) of his driveway to check his mailbox   Time 8   Period Weeks   Status On-going     PT LONG TERM GOAL  #9   TITLE The patient will be able to ascend and descend full  flight of steps at home reciprocally with cane and 1 railing   Time 8   Period Weeks   Status On-going     PT LONG TERM GOAL  #10   TITLE The patient will have  improved  right LE strength needed to get in/out of the bathtub using UE assist /bars as needed   Time 8   Period Weeks   Status On-going     PT LONG TERM GOAL  #11   TITLE The patient will have the right hip strength to 4+/5 needed to elevate and lower stool in the operating room   Time 8   Period Weeks   Status On-going     PT LONG TERM GOAL  #12   TITLE The patient will be able to ambulate 5 minutes on the treadmill as needed to initiate cardiac rehab program   Status Achieved               Plan - 10/17/16 1655    Clinical Impression Statement Patient able to do mild to moderate level incline without knee give-way but does tend to hyperextend right knee for added stability.  Resisted backwards walking is challenging in both directions.   Therapist closely monitoring response and providing close contact-guard for safety.   Recheck progress toward goals next visit, ROM, MMT.       PT Next Visit Plan Recert next visit vs. discharge;  check progress toward goals      Patient will benefit from skilled therapeutic intervention in order to improve the following deficits and impairments:     Visit Diagnosis: Muscle weakness (generalized)  Other abnormalities of gait and mobility  Right knee pain, unspecified chronicity     Problem List Patient Active Problem List   Diagnosis Date Noted  . Long term current use of amiodarone 08/07/2016  . Right ankle pain   . Pain   . Tibial pain   . Acute idiopathic gout of right ankle   . Right knee pain   . Diabetes mellitus type 2 in obese ([place])   . Labile blood pressure   . Acute blood loss anemia   . Diabetes mellitus type 2 in nonobese ([facility])   . Debility 02/22/2016  . 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 Weakness of both lower extremities   . Lower extremity weakness   . Pneumonia   . Acute lumbar back pain   . Non-traumatic rhabdomyolysis   . Retroperitoneal bleed   . Back pain   . Leg weakness, bilateral    . Paroxysmal atrial fibrillation ([place])   . Ventricular fibrillation ([place]) 02/09/2016  . Aortic aneurysm without rupture ([place]) 02/09/2016  . Aortic insufficiency 02/09/2016  . HCAP (healthcare-associated pneumonia)   . Cardiac arrest ([place]) 02/01/2016  . Acute on chronic systolic heart failure ([place])   . Acute encephalopathy   . Thyroid activity decreased   . Acute hypoxemic respiratory failure ([place])   . Acute respiratory failure ([place])   . Encounter for central line placement   . Arrhythmia   . Altered mental status   . CKD (chronic kidney disease), stage IV ([place]) 08/06/2013    Class: Chronic  . Long term (current) use of anticoagulants 08/06/2013    Class: Chronic  . Special screening for malignant neoplasms, colon 04/11/2013  . Colon cancer screening 03/04/2013  . Chronic anticoagulation 03/04/2013  . Atrial flutter ([place]) 03/18/2012    Class: Acute  . Chronic systolic heart failure ([place]) 02/13/2012    Class: Acute  . Hypertension, accelerated 02/13/2012  . Aortic valve regurgitation, acquired 02/13/2012  . Aortic root enlargement ([place]) 02/13/2012  . Hyperlipidemia 02/13/2012  . Left bundle branch block 02/13/2012  . Obesity (BMI 30-39.9)  02/13/2012   Ruben Im, PT 10/17/16 5:10 PM Phone: [phone] Fax: [phone]  Alvera Singh 10/17/2016, 5:09 PM   [facility] [address] [place], [place], [address] Phone: [phone]   Fax:  [phone]  Name: Lukasz Rogus  MD MRN: [id number] Date of Birth: 1951-01-26

## 2016-10-19 ENCOUNTER — Encounter: Payer: Self-pay | Admitting: Physical Therapy

## 2016-10-19 ENCOUNTER — Ambulatory Visit: Payer: BC Managed Care – PPO | Admitting: Physical Therapy

## 2016-10-19 DIAGNOSIS — M6281 Muscle weakness (generalized): Secondary | ICD-10-CM | POA: Diagnosis not present

## 2016-10-19 DIAGNOSIS — R2689 Other abnormalities of gait and mobility: Secondary | ICD-10-CM

## 2016-10-19 NOTE — Therapy (Signed)
[facility] [address] [place], [place], [address] Phone: [phone]   Fax:  [phone]  Physical Therapy Treatment  Patient Details  Name: Eric Hagins  MD MRN: [id number] Date of Birth: 1951/03/08 Referring Provider: Dr. Glendale Chard  Encounter Date: 10/19/2016      PT End of Session - 10/19/16 1621    Visit Number 52   Date for PT Re-Evaluation 11/17/16   Authorization Type BCBS   PT Start Time 2536   PT Stop Time 1545   PT Time Calculation (min) 49 min   Activity Tolerance Patient tolerated treatment well   Behavior During Therapy [facility] for tasks assessed/performed      Past Medical History:  Diagnosis Date  . CHF (congestive heart failure) ([place])   . Chronic kidney disease    kidney fx studies increased   . Chronic lower back pain   . Claustrophobia   . Dysrhythmia    "palpitations"  . Exertional dyspnea 01/2012  . Heart murmur   . Hypertension   . Hypothyroidism   . Migraine 02/13/12   "opthalmic"  . Varicose vein of leg    right    Past Surgical History:  Procedure Laterality Date  . CARDIAC CATHETERIZATION N/A 02/17/2016   Procedure: Left Heart Cath and Coronary Angiography;  Surgeon: Jettie Booze, MD;  Location: [place] CV [facility];  Service: Cardiovascular;  Laterality: N/A;  . CARDIOVERSION  03/22/2012   Procedure: CARDIOVERSION;  Surgeon: Candee Furbish, MD;  Location: [facility] ENDOSCOPY;  Service: Cardiovascular;  Laterality: N/A;  . CARDIOVERSION  04/19/2012   Procedure: CARDIOVERSION;  Surgeon: Sinclair Grooms, MD;  Location: [place];  Service: Cardiovascular;  Laterality: N/A;  . COLONOSCOPY N/A 04/11/2013   Procedure: COLONOSCOPY;  Surgeon: Inda Castle, MD;  Location: [facility] ENDOSCOPY;  Service: Endoscopy;  Laterality: N/A;  . COLONOSCOPY N/A 04/11/2013   Procedure: COLONOSCOPY;  Surgeon: Inda Castle, MD;  Location: [facility] ENDOSCOPY;  Service: Endoscopy;  Laterality: N/A;  . EP  IMPLANTABLE DEVICE N/A 02/17/2016   Procedure: BiV ICD Insertion CRT-D;  Surgeon: Evans Lance, MD;  Location: [place] CV LAB;  Service: Cardiovascular;  Laterality: N/A;  . FINGER SURGERY  2012   "4th digit right hand; thumb on left hand"  . RADIOLOGY WITH ANESTHESIA N/A 02/11/2016   Procedure: MRI OF THE BRAIN WITHOUT CONTRAST, LUMBAR WITHOUT CONTRAST;  Surgeon: Medication Radiologist, MD;  Location: [place];  Service: Radiology;  Laterality: N/A;  DR. WOOD/MRI  . Skin melanocytoma excision  2012   "above left clavicle"  . TEE WITHOUT CARDIOVERSION  03/22/2012   Procedure: TRANSESOPHAGEAL ECHOCARDIOGRAM (TEE);  Surgeon: Candee Furbish, MD;  Location: [facility] ENDOSCOPY;  Service: Cardiovascular;  Laterality: N/A;  . TEE WITHOUT CARDIOVERSION N/A 02/08/2016   Procedure: TRANSESOPHAGEAL ECHOCARDIOGRAM (TEE);  Surgeon: Lelon Perla, MD;  Location: [facility] ENDOSCOPY;  Service: Cardiovascular;  Laterality: N/A;    There were no vitals filed for this visit.      Subjective Assessment - 10/19/16 1457    Subjective I have trouble managing driveway to walk up and down.  I still have trouble going down steps. I can feel the patella move with quad contraction.  I still have Numbness and tingling in right thigh. No pain just stiffness.   How long can you sit comfortably? sitting for 45 min and right leg gets numb   How long can you stand comfortably? without walker 60 sec.    How  long can you walk comfortably? 30 min    Patient Stated Goals regain previous mobility.  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 No/denies            [facility] PT Assessment - 10/19/16 0001      Assessment   Medical Diagnosis R26.9 Abnormlities of gait   Referring Provider Dr. Glendale Chard   Onset Date/Surgical Date 01/31/16   Prior Therapy home health     Precautions   Precautions ICD/Pacemaker     Balance Screen   Has the patient fallen in  the past 6 months No   Has the patient had a decrease in activity level because of a fear of falling?  No   Is the patient reluctant to leave their home because of a fear of falling?  No     Home Ecologist residence     Prior Function   Level of Independence Independent with basic ADLs   Vocation Part time employment   Vocation Requirements opthalmalogist   Leisure helping in the [place]   Overall Cognitive Status Within [place] for tasks assessed     Observation/Other Assessments   Focus on Therapeutic Outcomes (FOTO)  50% limitation per therapist assessment of limitations     Sensation   Light Touch Impaired by gross assessment  on right knee has decreased sensation     Posture/Postural Control   Posture Comments right quad atrophy     AROM   Right Knee Extension -20  able to hold it   Right Knee Flexion 130     Strength   Right Hip Flexion 4/5   Right Hip Extension 5/5   Right Hip External Rotation  5/5   Right Hip Internal Rotation 5/5   Right Hip ABduction 4-/5   Right Hip ADduction 5/5   Right Knee Flexion 4+/5   Right Knee Extension 2+/5     Ambulation/Gait   Ambulation/Gait Yes   Stairs Yes   Stair Management Technique Step to pattern;Forwards;Two rails  downstairs step to step; up step over step   Gait Comments outdoor incline/decline with cane 2x with contact-guard assist                     [facility] Adult PT Treatment/Exercise - 10/19/16 0001      Knee/Hip Exercises: Aerobic   Tread Mill 1.5 x 61min; 1.0 incline 10% incline   Nustep L4 5 min legs only                PT Education - 10/19/16 1543    Education provided Yes   Education Details functional strengthening exercises with resistance bands, instructed patient on starting the process to get referral for cardiac rehab   Person(s) Educated Patient   Methods Explanation;Handout;Demonstration   Comprehension Verbalized  understanding;Returned demonstration          PT Short Term Goals - 10/17/16 1702      PT SHORT TERM GOAL #1   Title independent with initial HEP    Status Achieved     PT SHORT TERM GOAL #2   Title be able to go to the gym to do the bike or elliptical for improving strength   Status Achieved     PT SHORT TERM GOAL #3   Title right knee extension AROM in sitting -30 degress due  to increase strength   Status Achieved     PT SHORT TERM GOAL #4   Title pain in right leg decreased >/= 25%    Status Achieved     PT SHORT TERM GOAL #5   Title able to get in and out of car without helping his right leg due to increased strength   Status Achieved           PT Long Term Goals - 10/19/16 1509      PT LONG TERM GOAL #1   Title independent with HEP and basic gym program including understanding of  how to progress himself   Time 8   Period Weeks   Status On-going     PT LONG TERM GOAL #2   Title able to stand for 25 min with pain decreased >/= 50% so he is able to cook a meal.   Time 8   Period Weeks   Status Achieved     PT LONG TERM GOAL #3   Title going up and down stairs with step to step with a cane   Time 8   Period Weeks   Status Achieved     PT LONG TERM GOAL #4   Title sit on stool and scoot around office to assess patients   Time 8   Period Weeks   Status Achieved     PT LONG TERM GOAL #5   Title return to household chores due to lower extremity strength >/= 4/5   Baseline Unable to take out trash due to inclined drive way   Time 8   Period Weeks   Status New     PT LONG TERM GOAL #6   Title return to driving due to increased right lower extremity strength >/= 4-/5   Time 8   Period Weeks   Status Achieved     PT LONG TERM GOAL #7   Title pain decreased >/= 50% in right knee   Time 8   Period Weeks   Status Achieved     PT LONG TERM GOAL #8   Title The patient will be able to ambulate with SPC on the incline (and decline) of his driveway to  check his mailbox   Time 8   Period Weeks   Status On-going  trouble on driveway     PT LONG TERM GOAL  #9   TITLE The patient will be able to ascend and descend full  flight of steps at home reciprocally with cane and 1 railing   Time 8   Period Weeks   Status On-going  goes down leading with right with step to step pattern     PT LONG TERM GOAL  #10   TITLE The patient will have improved right LE strength needed to get in/out of the bathtub using UE assist /bars as needed   Time 8   Period Weeks   Status On-going  still taking showers     PT LONG TERM GOAL  #11   TITLE The patient will have the right hip strength to 4+/5 needed to elevate and lower stool in the operating room   Status On-going  trouble pumping pedal with left foot     PT LONG TERM GOAL  #12   TITLE The patient will be able to ambulate 5 minutes on the treadmill as needed to initiate cardiac rehab program   Time 8   Period Weeks   Status Achieved  Plan - 10/19/16 1638    Clinical Impression Statement Patient is able to press on the foot pedal during surgery but not consistently due to decreased strength.  Patient goes down stairs with step to step pattern leading with the right due to decreased right quad strength. Patient is driving but only on nice days due to safety. Patient has difficulty walking on an incline safely due to decresaed strength of right quads. Patient has not started cardiac rehab yet du eto needig referral. Patient is now able to walk on an incline on treadmill for 8 min. Patient will benefit from skilled therapy to progress right knee strength so he  is able to go down steps and walk on an incline.  Patient will continue until he is in cardiac rehab.    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   PT Frequency 2x / week   PT Duration 8 weeks   PT Treatment/Interventions  ADLs/Self Care Home Management;Cryotherapy;Electrical Stimulation;Functional mobility training;Stair training;Gait training;Ultrasound;Moist Heat;Therapeutic activities;Therapeutic exercise;Balance training;Neuromuscular re-education;Patient/family education;Passive range of motion;Manual techniques;Energy conservation;Other (comment)   PT Next Visit Plan eccentric contraction fo right quads, walking down stairs, walking on incline, lunges squats   PT Home Exercise Plan progress as needed   Consulted and Agree with Plan of Care Patient      Patient will benefit from skilled therapeutic intervention in order to improve the following deficits and impairments:  Abnormal gait, Difficulty walking, Decreased endurance, Pain, Decreased activity tolerance, Decreased balance, Decreased strength, Decreased mobility  Visit Diagnosis: Muscle weakness (generalized) - Plan: PT plan of care cert/re-cert  Other abnormalities of gait and mobility - Plan: PT plan of care cert/re-cert     Problem List Patient Active Problem List   Diagnosis Date Noted  . Long term current use of amiodarone 08/07/2016  . Right ankle pain   . Pain   . Tibial pain   . Acute idiopathic gout of right ankle   . Right knee pain   . Diabetes mellitus type 2 in obese ([place])   . Labile blood pressure   . Acute blood loss anemia   . Diabetes mellitus type 2 in nonobese ([facility])   . Debility 02/22/2016  . 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 Weakness of both lower extremities   . Lower extremity weakness   . Pneumonia   . Acute lumbar back pain   . Non-traumatic rhabdomyolysis   . Retroperitoneal bleed   . Back pain   . Leg weakness, bilateral   . Paroxysmal atrial fibrillation ([place])   . Ventricular fibrillation ([place]) 02/09/2016  . Aortic aneurysm without rupture ([place]) 02/09/2016  . Aortic insufficiency 02/09/2016  . HCAP (healthcare-associated pneumonia)   . Cardiac arrest ([place]) 02/01/2016  . Acute  on chronic systolic heart failure ([place])   . Acute encephalopathy   . Thyroid activity decreased   . Acute hypoxemic respiratory failure ([place])   . Acute respiratory failure ([place])   . Encounter for central line placement   . Arrhythmia   . Altered mental status   . CKD (chronic kidney disease), stage IV ([place]) 08/06/2013    Class: Chronic  . Long term (current) use of anticoagulants 08/06/2013    Class: Chronic  . Special screening for malignant neoplasms, colon 04/11/2013  . Colon cancer screening 03/04/2013  . Chronic anticoagulation 03/04/2013  . Atrial flutter ([place]) 03/18/2012    Class: Acute  . Chronic  systolic heart failure ([place]) 02/13/2012    Class: Acute  . Hypertension, accelerated 02/13/2012  . Aortic valve regurgitation, acquired 02/13/2012  . Aortic root enlargement ([place]) 02/13/2012  . Hyperlipidemia 02/13/2012  . Left bundle branch block 02/13/2012  . Obesity (BMI 30-39.9) 02/13/2012    Earlie Counts, PT 10/19/16 4:57 PM   [facility] [address], [place], [place], [address] Phone: [phone]   Fax:  [phone]  Name: Eric Coomes  MD MRN: [id number] Date of Birth: 26-Dec-1950

## 2016-10-19 NOTE — Patient Instructions (Signed)
Sitting and pressing right foot into the blue band to facilitate pushing on pedal during surgery 30x  then pump foot up and down against the blue band 30x  Walk against black band with attached to door. 30x walk sideways 5-10 times each way  Lunge forward into right knee with walker in front.  Hold lunge 5 seconds working toward 10 seconds 10-15 times.  [place] [address], [place], [place] [address] Phone # [phone] Fax [phone]

## 2016-10-24 ENCOUNTER — Encounter: Payer: Self-pay | Admitting: Physical Therapy

## 2016-10-24 ENCOUNTER — Ambulatory Visit: Payer: BC Managed Care – PPO | Admitting: Physical Therapy

## 2016-10-24 DIAGNOSIS — M6281 Muscle weakness (generalized): Secondary | ICD-10-CM

## 2016-10-24 DIAGNOSIS — M25561 Pain in right knee: Secondary | ICD-10-CM

## 2016-10-24 DIAGNOSIS — G8929 Other chronic pain: Secondary | ICD-10-CM

## 2016-10-24 DIAGNOSIS — R2689 Other abnormalities of gait and mobility: Secondary | ICD-10-CM

## 2016-10-24 NOTE — Therapy (Signed)
[facility] [address], [place], [place], [address] Phone: [phone]   Fax:  [phone]  Physical Therapy Treatment  Patient Details  Name: Eric Hemphill  MD MRN: [id number] Date of Birth: December 07, 1950 Referring Provider: Dr. Glendale Chard  Encounter Date: 10/24/2016      PT End of Session - 10/24/16 1537    Visit Number 16   Date for PT Re-Evaluation 11/17/16   Authorization Type BCBS   PT Start Time 1532   PT Stop Time 1611   PT Time Calculation (min) 39 min   Activity Tolerance Patient tolerated treatment well   Behavior During Therapy [facility] for tasks assessed/performed      Past Medical History:  Diagnosis Date  . CHF (congestive heart failure) ([place])   . Chronic kidney disease    kidney fx studies increased   . Chronic lower back pain   . Claustrophobia   . Dysrhythmia    "palpitations"  . Exertional dyspnea 01/2012  . Heart murmur   . Hypertension   . Hypothyroidism   . Migraine 02/13/12   "opthalmic"  . Varicose vein of leg    right    Past Surgical History:  Procedure Laterality Date  . CARDIAC CATHETERIZATION N/A 02/17/2016   Procedure: Left Heart Cath and Coronary Angiography;  Surgeon: Jettie Booze, MD;  Location: [place] [facility];  Service: Cardiovascular;  Laterality: N/A;  . CARDIOVERSION  03/22/2012   Procedure: CARDIOVERSION;  Surgeon: Candee Furbish, MD;  Location: [facility] ENDOSCOPY;  Service: Cardiovascular;  Laterality: N/A;  . CARDIOVERSION  04/19/2012   Procedure: CARDIOVERSION;  Surgeon: Sinclair Grooms, MD;  Location: [place];  Service: Cardiovascular;  Laterality: N/A;  . COLONOSCOPY N/A 04/11/2013   Procedure: COLONOSCOPY;  Surgeon: Inda Castle, MD;  Location: [facility]OS[facility];  Service: Endoscopy;  Laterality: N/A;  . COLONOSCOPY N/A 04/11/2013   Procedure: COLONOSCOPY;  Surgeon: Inda Castle, MD;  Location: [facility] ENDOSCOPY;  Service: Endoscopy;  Laterality: N/A;  . EP  IMPLANTABLE DEVICE N/A 02/17/2016   Procedure: BiV ICD Insertion CRT-D;  Surgeon: Evans Lance, MD;  Location: [place] [facility] LAB;  Service: Cardiovascular;  Laterality: N/A;  . FINGER SURGERY  2012   "4th digit right hand; thumb on left hand"  . RADIOLOGY WITH ANESTHESIA N/A 02/11/2016   Procedure: MRI OF THE BRAIN WITHOUT CONTRAST, LUMBAR WITHOUT CONTRAST;  Surgeon: Medication Radiologist, MD;  Location: [place];  Service: Radiology;  Laterality: N/A;  DR. WOOD/MRI  . Skin melanocytoma excision  2012   "above left clavicle"  . TEE WITHOUT CARDIOVERSION  03/22/2012   Procedure: TRANSESOPHAGEAL ECHOCARDIOGRAM (TEE);  Surgeon: Candee Furbish, MD;  Location: [facility]COPY;  Service: Cardiovascular;  Laterality: N/A;  . TEE WITHOUT CARDIOVERSION N/A 02/08/2016   Procedure: TRANSESOPHAGEAL ECHOCARDIOGRAM (TEE);  Surgeon: Lelon Perla, MD;  Location: [facility] ENDOSCOPY;  Service: Cardiovascular;  Laterality: N/A;    There were no vitals filed for this visit.      Subjective Assessment - 10/24/16 1534    Subjective Pt reports has been practicing stairs. Feels that hes improving, especially with going up, still struggling with going down. Knee feeling tight.   Patient is accompained by: Family member   How long can you sit comfortably? sitting for 45 min and right leg gets numb   How long can you stand comfortably? without walker 60 sec.    How long can you walk comfortably? 30 min    Patient  Stated Goals regain previous mobility.  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 Yes   Pain Score 3    Pain Location Knee   Pain Orientation Right   Pain Descriptors / Indicators Tightness   Pain Type Chronic pain   Pain Radiating Towards down right leg   Pain Onset More than a month ago   Pain Frequency Intermittent                         [facility] Adult PT Treatment/Exercise - 10/24/16 0001      Knee/Hip  Exercises: Aerobic   Tread Mill 1.0 x 61min; lvl 6 incline   Nustep L4 8 min legs only  Therapist present to discuss treatment     Knee/Hip Exercises: Machines for Strengthening   Total Gym Leg Press Seat 7; Bil 100# 30x, RTLE 60#  30x     Knee/Hip Exercises: Standing   Step Down Left;2 sets;10 reps;Hand Hold: 2;Step Height: 4"                  PT Short Term Goals - 10/17/16 1702      PT SHORT TERM GOAL #1   Title independent with initial HEP    Status Achieved     PT SHORT TERM GOAL #2   Title be able to go to the gym to do the bike or elliptical for improving strength   Status Achieved     PT SHORT TERM GOAL #3   Title right knee extension AROM in sitting -30 degress due to increase strength   Status Achieved     PT SHORT TERM GOAL #4   Title pain in right leg decreased >/= 25%    Status Achieved     PT SHORT TERM GOAL #5   Title able to get in and out of car without helping his right leg due to increased strength   Status Achieved           PT Long Term Goals - 10/24/16 1537      PT LONG TERM GOAL #1   Title independent with HEP and basic gym program including understanding of  how to progress himself   Time 8   Period Weeks   Status On-going     PT LONG TERM GOAL #5   Title return to household chores due to lower extremity strength >/= 4/5   Baseline Unable to take out trash due to inclined drive way   Time 8   Period Weeks   Status On-going     PT LONG TERM GOAL #8   Title The patient will be able to ambulate with SPC on the incline (and decline) of his driveway to check his mailbox   Time 8   Period Weeks   Status On-going     PT LONG TERM GOAL  #9   TITLE The patient will be able to ascend and descend full  flight of steps at home reciprocally with cane and 1 railing   Time 8   Period Weeks   Status On-going     PT LONG TERM GOAL  #10   TITLE The patient will have improved right LE strength needed to get in/out of the bathtub using  UE assist /bars as needed   Time 8   Period Weeks   Status On-going     PT LONG TERM  GOAL  #11   TITLE The patient will have the right hip strength to 4+/5 needed to elevate and lower stool in the operating room   Time 8   Period Weeks   Status On-going               Plan - 10/24/16 1609    Clinical Impression Statement Pt reports knee feeling tight but doing well over all. Pt able to tolerate all exercsies well with some muscle fatigue. Treadmill with focus on incline progression to advance towards goals to safley go up and down driveway. Pt will continue to benefit from skilled therapy for LE strength and progression    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   PT Frequency 2x / week   PT Duration 8 weeks   PT Treatment/Interventions ADLs/Self Care Home Management;Cryotherapy;Electrical Stimulation;Functional mobility training;Stair training;Gait training;Ultrasound;Moist Heat;Therapeutic activities;Therapeutic exercise;Balance training;Neuromuscular re-education;Patient/family education;Passive range of motion;Manual techniques;Energy conservation;Other (comment)   PT Next Visit Plan eccentric contraction fo right quads, walking down stairs, walking on incline, lunges squats   Consulted and Agree with Plan of Care Patient      Patient will benefit from skilled therapeutic intervention in order to improve the following deficits and impairments:  Abnormal gait, Difficulty walking, Decreased endurance, Pain, Decreased activity tolerance, Decreased balance, Decreased strength, Decreased mobility  Visit Diagnosis: Muscle weakness (generalized)  Other abnormalities of gait and mobility  Right knee pain, unspecified chronicity  Chronic pain of right knee     Problem List Patient Active Problem List   Diagnosis Date Noted  . Long term current use of amiodarone 08/07/2016  .  Right ankle pain   . Pain   . Tibial pain   . Acute idiopathic gout of right ankle   . Right knee pain   . Diabetes mellitus type 2 in obese ([place])   . Labile blood pressure   . Acute blood loss anemia   . Diabetes mellitus type 2 in nonobese ([facility])   . Debility 02/22/2016  . 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 Weakness of both lower extremities   . Lower extremity weakness   . Pneumonia   . Acute lumbar back pain   . Non-traumatic rhabdomyolysis   . Retroperitoneal bleed   . Back pain   . Leg weakness, bilateral   . Paroxysmal atrial fibrillation ([place])   . Ventricular fibrillation ([place]) 02/09/2016  . Aortic aneurysm without rupture ([place]) 02/09/2016  . Aortic insufficiency 02/09/2016  . HCAP (healthcare-associated pneumonia)   . Cardiac arrest ([place]) 02/01/2016  . Acute on chronic systolic heart failure ([place])   . Acute encephalopathy   . Thyroid activity decreased   . Acute hypoxemic respiratory failure ([place])   . Acute respiratory failure ([place])   . Encounter for central line placement   . Arrhythmia   . Altered mental status   . CKD (chronic kidney disease), stage IV ([place]) 08/06/2013    Class: Chronic  . Long term (current) use of anticoagulants 08/06/2013    Class: Chronic  . Special screening for malignant neoplasms, colon 04/11/2013  . Colon cancer screening 03/04/2013  . Chronic anticoagulation 03/04/2013  . Atrial flutter ([place]) 03/18/2012    Class: Acute  . Chronic systolic heart failure ([place]) 02/13/2012    Class: Acute  . Hypertension, accelerated 02/13/2012  . Aortic valve regurgitation, acquired 02/13/2012  . Aortic root enlargement ([place]) 02/13/2012  . Hyperlipidemia 02/13/2012  .  Left bundle branch block 02/13/2012  . Obesity (BMI 30-39.9) 02/13/2012    Mikle Bosworth PTA 10/24/2016, 4:35 PM  [facility] [address], [place], [place], [address] Phone: [phone]    Fax:  [phone]  Name: Eric Trumbull  MD MRN: [id number] Date of Birth: 14-Oct-1950

## 2016-10-26 ENCOUNTER — Ambulatory Visit: Payer: BC Managed Care – PPO | Attending: Internal Medicine | Admitting: Physical Therapy

## 2016-10-26 ENCOUNTER — Encounter: Payer: Self-pay | Admitting: Physical Therapy

## 2016-10-26 DIAGNOSIS — M25561 Pain in right knee: Secondary | ICD-10-CM | POA: Diagnosis present

## 2016-10-26 DIAGNOSIS — M6281 Muscle weakness (generalized): Secondary | ICD-10-CM | POA: Insufficient documentation

## 2016-10-26 DIAGNOSIS — G8929 Other chronic pain: Secondary | ICD-10-CM | POA: Diagnosis present

## 2016-10-26 DIAGNOSIS — R2689 Other abnormalities of gait and mobility: Secondary | ICD-10-CM | POA: Diagnosis present

## 2016-10-26 NOTE — Therapy (Signed)
[facility] [address] [place], [place], [address] Phone: [phone]   Fax:  [phone]  Physical Therapy Treatment  Patient Details  Name: Eric Trapani  MD MRN: [id number] Date of Birth: 1951/03/20 Referring Provider: Dr. Glendale Chard  Encounter Date: 10/26/2016      PT End of Session - 10/26/16 1628    Visit Number 24   Date for PT Re-Evaluation 11/17/16   Authorization Type BCBS   PT Start Time 1625  Patient late   PT Stop Time 1703   PT Time Calculation (min) 38 min   Activity Tolerance Patient tolerated treatment well   Behavior During Therapy [facility] for tasks assessed/performed      Past Medical History:  Diagnosis Date  . CHF (congestive heart failure) ([place])   . Chronic kidney disease    kidney fx studies increased   . Chronic lower back pain   . Claustrophobia   . Dysrhythmia    "palpitations"  . Exertional dyspnea 01/2012  . Heart murmur   . Hypertension   . Hypothyroidism   . Migraine 02/13/12   "opthalmic"  . Varicose vein of leg    right    Past Surgical History:  Procedure Laterality Date  . CARDIAC CATHETERIZATION N/A 02/17/2016   Procedure: Left Heart Cath and Coronary Angiography;  Surgeon: Jettie Booze, MD;  Location: [place] CV LAB;  Service: Cardiovascular;  Laterality: N/A;  . CARDIOVERSION  03/22/2012   Procedure: CARDIOVERSION;  Surgeon: Candee Furbish, MD;  Location: [facility] ENDOSCOPY;  Service: Cardiovascular;  Laterality: N/A;  . CARDIOVERSION  04/19/2012   Procedure: CARDIOVERSION;  Surgeon: Sinclair Grooms, MD;  Location: [place];  Service: Cardiovascular;  Laterality: N/A;  . COLONOSCOPY N/A 04/11/2013   Procedure: COLONOSCOPY;  Surgeon: Inda Castle, MD;  Location: [facility] ENDOSCOPY;  Service: Endoscopy;  Laterality: N/A;  . COLONOSCOPY N/A 04/11/2013   Procedure: COLONOSCOPY;  Surgeon: Inda Castle, MD;  Location: [facility] ENDOSCOPY;  Service: Endoscopy;  Laterality: N/A;   . EP IMPLANTABLE DEVICE N/A 02/17/2016   Procedure: BiV ICD Insertion CRT-D;  Surgeon: Evans Lance, MD;  Location: [place] CV LAB;  Service: Cardiovascular;  Laterality: N/A;  . FINGER SURGERY  2012   "4th digit right hand; thumb on left hand"  . RADIOLOGY WITH ANESTHESIA N/A 02/11/2016   Procedure: MRI OF THE BRAIN WITHOUT CONTRAST, LUMBAR WITHOUT CONTRAST;  Surgeon: Medication Radiologist, MD;  Location: [place];  Service: Radiology;  Laterality: N/A;  DR. WOOD/MRI  . Skin melanocytoma excision  2012   "above left clavicle"  . TEE WITHOUT CARDIOVERSION  03/22/2012   Procedure: TRANSESOPHAGEAL ECHOCARDIOGRAM (TEE);  Surgeon: Candee Furbish, MD;  Location: [facility] ENDOSCOPY;  Service: Cardiovascular;  Laterality: N/A;  . TEE WITHOUT CARDIOVERSION N/A 02/08/2016   Procedure: TRANSESOPHAGEAL ECHOCARDIOGRAM (TEE);  Surgeon: Lelon Perla, MD;  Location: [facility] ENDOSCOPY;  Service: Cardiovascular;  Laterality: N/A;    There were no vitals filed for this visit.      Subjective Assessment - 10/26/16 1627    Subjective Pt reports knee feeling stiff today.    Patient is accompained by: Family member   How long can you sit comfortably? sitting for 45 min and right leg gets numb   How long can you stand comfortably? without walker 60 sec.    How long can you walk comfortably? 30 min    Patient Stated Goals regain previous mobility.  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 cooking, making bed, washing dishes    Currently in Pain? Yes   Pain Score 5    Pain Location Knee   Pain Orientation Right   Pain Descriptors / Indicators Tightness   Pain Type Chronic pain   Pain Onset More than a month ago   Pain Frequency Intermittent                         [facility] Adult PT Treatment/Exercise - 10/26/16 0001      Knee/Hip Exercises: Stretches   Sports administrator Right;3 reps;10 seconds  on stairs   Gastroc Stretch 2 reps;Both;10 seconds     Knee/Hip Exercises: Aerobic    Tread Mill 1.0 x 55min; lvl 9 incline   Nustep L4 8 min legs only  Therapist present to discuss treatment     Knee/Hip Exercises: Machines for Strengthening   Total Gym Leg Press Seat 7; Bil 100# 30x, RTLE 60#  30x     Knee/Hip Exercises: Standing   Forward Step Up Right;Hand Hold: 2;Hand Hold: 1;Step Height: 6";1 set   Step Down Left;2 sets;10 reps;Hand Hold: 2;Step Height: 6"  Half squat                  PT Short Term Goals - 10/17/16 1702      PT SHORT TERM GOAL #1   Title independent with initial HEP    Status Achieved     PT SHORT TERM GOAL #2   Title be able to go to the gym to do the bike or elliptical for improving strength   Status Achieved     PT SHORT TERM GOAL #3   Title right knee extension AROM in sitting -30 degress due to increase strength   Status Achieved     PT SHORT TERM GOAL #4   Title pain in right leg decreased >/= 25%    Status Achieved     PT SHORT TERM GOAL #5   Title able to get in and out of car without helping his right leg due to increased strength   Status Achieved           PT Long Term Goals - 10/24/16 1537      PT LONG TERM GOAL #1   Title independent with HEP and basic gym program including understanding of  how to progress himself   Time 8   Period Weeks   Status On-going     PT LONG TERM GOAL #5   Title return to household chores due to lower extremity strength >/= 4/5   Baseline Unable to take out trash due to inclined drive way   Time 8   Period Weeks   Status On-going     PT LONG TERM GOAL #8   Title The patient will be able to ambulate with SPC on the incline (and decline) of his driveway to check his mailbox   Time 8   Period Weeks   Status On-going     PT LONG TERM GOAL  #9   TITLE The patient will be able to ascend and descend full  flight of steps at home reciprocally with cane and 1 railing   Time 8   Period Weeks   Status On-going     PT LONG TERM GOAL  #10   TITLE The patient will have  improved right LE strength needed to get in/out of the bathtub using UE assist /bars as  needed   Time 8   Period Weeks   Status On-going     PT LONG TERM GOAL  #11   TITLE The patient will have the right hip strength to 4+/5 needed to elevate and lower stool in the operating room   Time 8   Period Weeks   Status On-going               Plan - 10/26/16 1657    Clinical Impression Statement Pt continues to progress with strength and knee stability. Able to tolerate all exercisese well despite increased knee tightness and soreness from last session. Pt will continue to benefit from skilled therapy for advancment towards goals and knee stability.    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   PT Frequency 2x / week   PT Duration 8 weeks   PT Treatment/Interventions ADLs/Self Care Home Management;Cryotherapy;Electrical Stimulation;Functional mobility training;Stair training;Gait training;Ultrasound;Moist Heat;Therapeutic activities;Therapeutic exercise;Balance training;Neuromuscular re-education;Patient/family education;Passive range of motion;Manual techniques;Energy conservation;Other (comment)   PT Next Visit Plan [place], squats, incline walking   Consulted and Agree with Plan of Care Patient      Patient will benefit from skilled therapeutic intervention in order to improve the following deficits and impairments:  Abnormal gait, Difficulty walking, Decreased endurance, Pain, Decreased activity tolerance, Decreased balance, Decreased strength, Decreased mobility  Visit Diagnosis: Muscle weakness (generalized)  Other abnormalities of gait and mobility  Right knee pain, unspecified chronicity  Chronic pain of right knee     Problem List Patient Active Problem List   Diagnosis Date Noted  . Long term current use of amiodarone 08/07/2016  . Right ankle pain   . Pain   .  Tibial pain   . Acute idiopathic gout of right ankle   . Right knee pain   . Diabetes mellitus type 2 in obese ([place])   . Labile blood pressure   . Acute blood loss anemia   . Diabetes mellitus type 2 in nonobese ([facility])   . Debility 02/22/2016  . 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 Weakness of both lower extremities   . Lower extremity weakness   . Pneumonia   . Acute lumbar back pain   . Non-traumatic rhabdomyolysis   . Retroperitoneal bleed   . Back pain   . Leg weakness, bilateral   . Paroxysmal atrial fibrillation ([place])   . Ventricular fibrillation ([place]) 02/09/2016  . Aortic aneurysm without rupture ([place]) 02/09/2016  . Aortic insufficiency 02/09/2016  . HCAP (healthcare-associated pneumonia)   . Cardiac arrest ([place]) 02/01/2016  . Acute on chronic systolic heart failure ([place])   . Acute encephalopathy   . Thyroid activity decreased   . Acute hypoxemic respiratory failure ([place])   . Acute respiratory failure ([place])   . Encounter for central line placement   . Arrhythmia   . Altered mental status   . CKD (chronic kidney disease), stage IV ([place]) 08/06/2013    Class: Chronic  . Long term (current) use of anticoagulants 08/06/2013    Class: Chronic  . Special screening for malignant neoplasms, colon 04/11/2013  . Colon cancer screening 03/04/2013  . Chronic anticoagulation 03/04/2013  . Atrial flutter ([place]) 03/18/2012    Class: Acute  . Chronic systolic heart failure ([place]) 02/13/2012    Class: Acute  . Hypertension, accelerated 02/13/2012  . Aortic valve regurgitation, acquired 02/13/2012  . Aortic root enlargement ([place]) 02/13/2012  . Hyperlipidemia 02/13/2012  .  Left bundle branch block 02/13/2012  . Obesity (BMI 30-39.9) 02/13/2012    Mikle Bosworth PTA 10/26/2016, 5:02 PM  [facility] [address] [place], [place], [address] Phone: [phone]   Fax:  [phone]  Name: Eric Burchill  MD MRN: [id number] Date of Birth: 04-Nov-1950

## 2016-10-31 ENCOUNTER — Ambulatory Visit: Payer: BC Managed Care – PPO | Admitting: Physical Therapy

## 2016-10-31 DIAGNOSIS — R2689 Other abnormalities of gait and mobility: Secondary | ICD-10-CM

## 2016-10-31 DIAGNOSIS — M6281 Muscle weakness (generalized): Secondary | ICD-10-CM

## 2016-10-31 DIAGNOSIS — M25561 Pain in right knee: Secondary | ICD-10-CM

## 2016-10-31 NOTE — Therapy (Signed)
[facility] [address] [place], [place], [address] Phone: [phone]   Fax:  [phone]  Physical Therapy Treatment  Patient Details  Name: Eric Tippin  MD MRN: [id number] Date of Birth: [birth year] Referring Provider: Dr. Glendale Chard  Encounter Date: 10/31/2016      PT End of Session - 10/31/16 1655    Visit Number 50   Date for PT Re-Evaluation 11/17/16   Authorization Type BCBS   PT Start Time 1616   PT Stop Time 1702   PT Time Calculation (min) 46 min   Activity Tolerance Patient tolerated treatment well      Past Medical History:  Diagnosis Date  . CHF (congestive heart failure) ([place])   . Chronic kidney disease    kidney fx studies increased   . Chronic lower back pain   . Claustrophobia   . Dysrhythmia    "palpitations"  . Exertional dyspnea 01/2012  . Heart murmur   . Hypertension   . Hypothyroidism   . Migraine 02/13/12   "opthalmic"  . Varicose vein of leg    right    Past Surgical History:  Procedure Laterality Date  . CARDIAC CATHETERIZATION N/A 02/17/2016   Procedure: Left Heart Cath and Coronary Angiography;  Surgeon: Jettie Booze, MD;  Location: [place] CV LAB;  Service: Cardiovascular;  Laterality: N/A;  . CARDIOVERSION  03/22/2012   Procedure: CARDIOVERSION;  Surgeon: Candee Furbish, MD;  Location[facility] ENDOSCOPY;  Service: Cardiovascular;  Laterality: N/A;  . CARDIOVERSION  04/19/2012   Procedure: CARDIOVERSION;  Surgeon: Sinclair Grooms, MD;  Location: [place];  Service: Cardiovascular;  Laterality: N/A;  . COLONOSCOPY N/A 04/11/2013   Procedure: COLONOSCOPY;  Surgeon: Inda Castle, MD;  Location: [facility] ENDOSCOPY;  Service: Endoscopy;  Laterality: N/A;  . COLONOSCOPY N/A 04/11/2013   Procedure: COLONOSCOPY;  Surgeon: Inda Castle, MD;  Location: [facility] ENDOSCOPY;  Service: Endoscopy;  Laterality: N/A;  . EP IMPLANTABLE DEVICE N/A 02/17/2016   Procedure: BiV ICD Insertion  CRT-D;  Surgeon: Evans Lance, MD;  Location: [place] CV LAB;  Service: Cardiovascular;  Laterality: N/A;  . FINGER SURGERY  2012   "4th digit right hand; thumb on left hand"  . RADIOLOGY WITH ANESTHESIA N/A 02/11/2016   Procedure: MRI OF THE BRAIN WITHOUT CONTRAST, LUMBAR WITHOUT CONTRAST;  Surgeon: Medication Radiologist, MD;  Location: [place];  Service: Radiology;  Laterality: N/A;  DR. WOOD/MRI  . Skin melanocytoma excision  2012   "above left clavicle"  . TEE WITHOUT CARDIOVERSION  03/22/2012   Procedure: TRANSESOPHAGEAL ECHOCARDIOGRAM (TEE);  Surgeon: Candee Furbish, MD;  Location: [facility] ENDOSCOPY;  Service: Cardiovascular;  Laterality: N/A;  . TEE WITHOUT CARDIOVERSION N/A 02/08/2016   Procedure: TRANSESOPHAGEAL ECHOCARDIOGRAM (TEE);  Surgeon: Lelon Perla, MD;  Location: [facility] ENDOSCOPY;  Service: Cardiovascular;  Laterality: N/A;    There were no vitals filed for this visit.      Subjective Assessment - 10/31/16 1621    Subjective Doing pretty good.  Tired today b/c it has been busy.  A bit sore after therapy session.  My endurance is improving.  I can go to the grocery store or [employer] and not have to sit anymore.     Currently in Pain? Yes   Pain Score 3   tightness in thigh right   Pain Orientation Right   Pain Type Chronic pain   Pain Onset More than a month ago   Pain Frequency Intermittent  [place] Adult PT Treatment/Exercise - 10/31/16 0001      Knee/Hip Exercises: Aerobic   Tread Mill 1.0 x 11 min; lvl 9 incline   Nustep L4 8 min legs only  Therapist present to discuss treatment     Knee/Hip Exercises: Standing   Lateral Step Up Right;1 set;10 reps;Hand Hold: 2;Step Height: 2"   Lateral Step Up Limitations 2 point step down    Forward Step Up Right;10 reps;Hand Hold: 2;Step Height: 6"   Forward Step Up Limitations with foot on 2nd and 3rd step    Step Down Right;10 reps;Hand Hold: 2;Step Height: 2"   Walking with Sports Cord  20# forward and backward 8x each                  PT Short Term Goals - 10/31/16 1713      PT SHORT TERM GOAL #1   Title independent with initial HEP    Status Achieved     PT SHORT TERM GOAL #2   Title be able to go to the gym to do the bike or elliptical for improving strength   Status Achieved     PT SHORT TERM GOAL #3   Title right knee extension AROM in sitting -30 degress due to increase strength   Status Achieved     PT SHORT TERM GOAL #4   Title pain in right leg decreased >/= 25%    Status Achieved     PT SHORT TERM GOAL #5   Title able to get in and out of car without helping his right leg due to increased strength   Status Achieved           PT Long Term Goals - 10/31/16 1713      PT LONG TERM GOAL #1   Title independent with HEP and basic gym program including understanding of  how to progress himself   Time 8   Period Weeks   Status On-going     PT LONG TERM GOAL #2   Title able to stand for 25 min with pain decreased >/= 50% so he is able to cook a meal.   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   Status Achieved     PT LONG TERM GOAL #5   Title return to household chores due to lower extremity strength >/= 4/5   Period Weeks   Status On-going     PT LONG TERM GOAL #6   Title return to driving due to increased right lower extremity strength >/= 4-/5   Status Achieved     PT LONG TERM GOAL #7   Title pain decreased >/= 50% in right knee   Status Achieved     PT LONG TERM GOAL #8   Title The patient will be able to ambulate with SPC on the incline (and decline) of his driveway to check his mailbox   Time 8   Period Weeks   Status On-going     PT LONG TERM GOAL  #9   TITLE The patient will be able to ascend and descend full  flight of steps at home  reciprocally with cane and 1 railing   Time 8   Status On-going  PT LONG TERM GOAL  #10   TITLE The patient will have improved right LE strength needed to get in/out of the bathtub using UE assist /bars as needed   Time 8   Period Weeks   Status On-going     PT LONG TERM GOAL  #11   Time 8   Period Weeks   Status On-going     PT LONG TERM GOAL  #12   TITLE The patient will be able to ambulate 5 minutes on the treadmill as needed to initiate cardiac rehab program   Status Achieved               Plan - 10/31/16 1706    Clinical Impression Statement The patient is improving with ambulation endurance including with a significant elevation.  Ambulation on level ground much more fluid and able to do short distances without his cane.  Lack of quad motor control eccentrically makes step downs challenging (needs about 50% assistance with UEs on railing).   Muscular fatigue reported and tightness during and following therapy session.   Therapist providing close supervision for safety.     PT Next Visit Plan quad motor control exercises;  lunges, incline walking      Patient will benefit from skilled therapeutic intervention in order to improve the following deficits and impairments:     Visit Diagnosis: Muscle weakness (generalized)  Other abnormalities of gait and mobility  Right knee pain, unspecified chronicity     Problem List Patient Active Problem List   Diagnosis Date Noted  . Long term current use of amiodarone 08/07/2016  . Right ankle pain   . Pain   . Tibial pain   . Acute idiopathic gout of right ankle   . Right knee pain   . Diabetes mellitus type 2 in obese ([place])   . Labile blood pressure   . Acute blood loss anemia   . Diabetes mellitus type 2 in nonobese ([facility])   . Debility 02/22/2016  . 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 Weakness of both lower extremities   . Lower extremity weakness   . Pneumonia   . Acute lumbar back  pain   . Non-traumatic rhabdomyolysis   . Retroperitoneal bleed   . Back pain   . Leg weakness, bilateral   . Paroxysmal atrial fibrillation ([place])   . Ventricular fibrillation ([place]) 02/09/2016  . Aortic aneurysm without rupture ([place]) 02/09/2016  . Aortic insufficiency 02/09/2016  . HCAP (healthcare-associated pneumonia)   . Cardiac arrest ([place]) 02/01/2016  . Acute on chronic systolic heart failure ([place])   . Acute encephalopathy   . Thyroid activity decreased   . Acute hypoxemic respiratory failure ([place])   . Acute respiratory failure ([place])   . Encounter for central line placement   . Arrhythmia   . Altered mental status   . CKD (chronic kidney disease), stage IV ([place]) 08/06/2013    Class: Chronic  . Long term (current) use of anticoagulants 08/06/2013    Class: Chronic  . Special screening for malignant neoplasms, colon 04/11/2013  . Colon cancer screening 03/04/2013  . Chronic anticoagulation 03/04/2013  . Atrial flutter ([place]) 03/18/2012    Class: Acute  . Chronic systolic heart failure ([place]) 02/13/2012    Class: Acute  . Hypertension, accelerated 02/13/2012  . Aortic valve regurgitation, acquired 02/13/2012  . Aortic root enlargement ([place]) 02/13/2012  . Hyperlipidemia 02/13/2012  . Left bundle branch block 02/13/2012  . Obesity (BMI 30-39.9)  02/13/2012   Ruben Im, PT 10/31/16 5:16 PM Phone: [phone] Fax: [phone]  Alvera Singh 10/31/2016, 5:15 PM  [facility] [address] [place], [place], [address] Phone: [phone]   Fax:  [phone]  Name: Eric Chadderdon  MD MRN: [id number] Date of Birth: 06-Nov-1950

## 2016-11-02 ENCOUNTER — Encounter: Payer: Self-pay | Admitting: Physical Therapy

## 2016-11-02 ENCOUNTER — Ambulatory Visit: Payer: BC Managed Care – PPO | Admitting: Physical Therapy

## 2016-11-02 DIAGNOSIS — M6281 Muscle weakness (generalized): Secondary | ICD-10-CM | POA: Diagnosis not present

## 2016-11-02 NOTE — Therapy (Signed)
[facility] [address] [place], [place], [address] Phone: [phone]   Fax:  [phone]  Physical Therapy Treatment  Patient Details  Name: Eric Sanor  MD MRN: [id number] Date of Birth: November 06, 1950 Referring Provider: Dr. Glendale Chard  Encounter Date: 11/02/2016      PT End of Session - 11/02/16 1639    Visit Number 86   Date for PT Re-Evaluation 11/17/16   Authorization Type BCBS   PT Start Time 1630  came 15 in late   PT Stop Time 1700   PT Time Calculation (min) 30 min   Activity Tolerance Patient tolerated treatment well   Behavior During Therapy [facility] for tasks assessed/performed      Past Medical History:  Diagnosis Date  . CHF (congestive heart failure) ([place])   . Chronic kidney disease    kidney fx studies increased   . Chronic lower back pain   . Claustrophobia   . Dysrhythmia    "palpitations"  . Exertional dyspnea 01/2012  . Heart murmur   . Hypertension   . Hypothyroidism   . Migraine 02/13/12   "opthalmic"  . Varicose vein of leg    right    Past Surgical History:  Procedure Laterality Date  . CARDIAC CATHETERIZATION N/A 02/17/2016   Procedure: Left Heart Cath and Coronary Angiography;  Surgeon: Jettie Booze, MD;  Location: [place] [facility];  Service: Cardiovascular;  Laterality: N/A;  . CARDIOVERSION  03/22/2012   Procedure: CARDIOVERSION;  Surgeon: Candee Furbish, MD;  Location: [facility] ENDOSCOPY;  Service: Cardiovascular;  Laterality: N/A;  . CARDIOVERSION  04/19/2012   Procedure: CARDIOVERSION;  Surgeon: Sinclair Grooms, MD;  Location: [place];  Service: Cardiovascular;  Laterality: N/A;  . COLONOSCOPY N/A 04/11/2013   Procedure: COLONOSCOPY;  Surgeon: Inda Castle, MD;  Location: [facility] ENDOSCOPY;  Service: Endoscopy;  Laterality: N/A;  . COLONOSCOPY N/A 04/11/2013   Procedure: COLONOSCOPY;  Surgeon: Inda Castle, MD;  Location: [facility] ENDOSCOPY;  Service: Endoscopy;  Laterality:  N/A;  . EP IMPLANTABLE DEVICE N/A 02/17/2016   Procedure: BiV ICD Insertion CRT-D;  Surgeon: Evans Lance, MD;  Location: [place] [facility];  Service: Cardiovascular;  Laterality: N/A;  . FINGER SURGERY  2012   "4th digit right hand; thumb on left hand"  . RADIOLOGY WITH ANESTHESIA N/A 02/11/2016   Procedure: MRI OF THE BRAIN WITHOUT CONTRAST, LUMBAR WITHOUT CONTRAST;  Surgeon: Medication Radiologist, MD;  Location: [place];  Service: Radiology;  Laterality: N/A;  DR. WOOD/MRI  . Skin melanocytoma excision  2012   "above left clavicle"  . TEE WITHOUT CARDIOVERSION  03/22/2012   Procedure: TRANSESOPHAGEAL ECHOCARDIOGRAM (TEE);  Surgeon: Candee Furbish, MD;  Location: [facility] ENDOSCOPY;  Service: Cardiovascular;  Laterality: N/A;  . TEE WITHOUT CARDIOVERSION N/A 02/08/2016   Procedure: TRANSESOPHAGEAL ECHOCARDIOGRAM (TEE);  Surgeon: Lelon Perla, MD;  Location: [facility] ENDOSCOPY;  Service: Cardiovascular;  Laterality: N/A;    There were no vitals filed for this visit.      Subjective Assessment - 11/02/16 1635    Subjective I am doing better.  I have not walked my driveway yet due to it being steep.    How long can you sit comfortably? sitting for 45 min and right leg gets numb   How long can you stand comfortably? without walker 60 sec.    How long can you walk comfortably? 30 min    Patient Stated Goals regain previous mobility.  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 cooking, making bed, washing dishes    Currently in Pain? Yes   Pain Score 2    Pain Location Knee   Pain Orientation Right   Pain Descriptors / Indicators Tightness   Pain Type Chronic pain   Pain Onset More than a month ago   Pain Frequency Intermittent   Aggravating Factors  walking > 30 min   Pain Relieving Factors rest, spinning the bike   Multiple Pain Sites No                         [facility] Adult PT Treatment/Exercise - 11/02/16 0001      Knee/Hip Exercises: Aerobic   Nustep L4  8 min legs only  Therapist present to discuss treatment     Knee/Hip Exercises: Machines for Strengthening   Total Gym Leg Press Seat 7; Bil 100# 30x, RTLE 60#  30x     Knee/Hip Exercises: Standing   Forward Lunges Right;1 set;10 reps;5 seconds  verbal cues to place weight onto right leg   Lateral Step Up Right;1 set;10 reps;Hand Hold: 2;Step Height: 2"   Lateral Step Up Limitations 2 point step down    Forward Step Up Right;10 reps;Hand Hold: 2;Step Height: 6"   Forward Step Up Limitations with foot on 2nd and 3rd step                 PT Education - 11/02/16 1704    Education provided No          PT Short Term Goals - 10/31/16 1713      PT SHORT TERM GOAL #1   Title independent with initial HEP    Status Achieved     PT SHORT TERM GOAL #2   Title be able to go to the gym to do the bike or elliptical for improving strength   Status Achieved     PT SHORT TERM GOAL #3   Title right knee extension AROM in sitting -30 degress due to increase strength   Status Achieved     PT SHORT TERM GOAL #4   Title pain in right leg decreased >/= 25%    Status Achieved     PT SHORT TERM GOAL #5   Title able to get in and out of car without helping his right leg due to increased strength   Status Achieved           PT Long Term Goals - 10/31/16 1713      PT LONG TERM GOAL #1   Title independent with HEP and basic gym program including understanding of  how to progress himself   Time 8   Period Weeks   Status On-going     PT LONG TERM GOAL #2   Title able to stand for 25 min with pain decreased >/= 50% so he is able to cook a meal.   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   Status Achieved     PT LONG TERM GOAL #5   Title return to household chores due to lower extremity strength >/= 4/5    Period Weeks   Status On-going  PT LONG TERM GOAL #6   Title return to driving due to increased right lower extremity strength >/= 4-/5   Status Achieved     PT LONG TERM GOAL #7   Title pain decreased >/= 50% in right knee   Status Achieved     PT LONG TERM GOAL #8   Title The patient will be able to ambulate with SPC on the incline (and decline) of his driveway to check his mailbox   Time 8   Period Weeks   Status On-going     PT LONG TERM GOAL  #9   TITLE The patient will be able to ascend and descend full  flight of steps at home reciprocally with cane and 1 railing   Time 8   Status On-going     PT LONG TERM GOAL  #10   TITLE The patient will have improved right LE strength needed to get in/out of the bathtub using UE assist /bars as needed   Time 8   Period Weeks   Status On-going     PT LONG TERM GOAL  #11   Time 8   Period Weeks   Status On-going     PT LONG TERM GOAL  #12   TITLE The patient will be able to ambulate 5 minutes on the treadmill as needed to initiate cardiac rehab program   Status Achieved               Plan - 11/02/16 1640    Clinical Impression Statement Patient is now able to lift right leg onto leg press for first time. Patient able to use control with step down with right leg on the step. Patient benefit from skilled therapy to to improve strength and get patient ready for cardiac rehab.    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   PT Frequency 2x / week   PT Duration 8 weeks   PT Treatment/Interventions ADLs/Self Care Home Management;Cryotherapy;Electrical Stimulation;Functional mobility training;Stair training;Gait training;Ultrasound;Moist Heat;Therapeutic activities;Therapeutic exercise;Balance training;Neuromuscular re-education;Patient/family education;Passive range of motion;Manual techniques;Energy conservation;Other (comment)    PT Next Visit Plan quad motor control exercises;  lunges, incline walking   PT Home Exercise Plan progress as needed   Consulted and Agree with Plan of Care Patient      Patient will benefit from skilled therapeutic intervention in order to improve the following deficits and impairments:  Abnormal gait, Difficulty walking, Decreased endurance, Pain, Decreased activity tolerance, Decreased balance, Decreased strength, Decreased mobility  Visit Diagnosis: Muscle weakness (generalized)     Problem List Patient Active Problem List   Diagnosis Date Noted  . Long term current use of amiodarone 08/07/2016  . Right ankle pain   . Pain   . Tibial pain   . Acute idiopathic gout of right ankle   . Right knee pain   . Diabetes mellitus type 2 in obese ([place])   . Labile blood pressure   . Acute blood loss anemia   . Diabetes mellitus type 2 in nonobese ([facility])   . Debility 02/22/2016  . 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 Weakness of both lower extremities   . Lower extremity weakness   . Pneumonia   . Acute lumbar back pain   . Non-traumatic rhabdomyolysis   . Retroperitoneal bleed   . Back pain   . Leg weakness, bilateral   . Paroxysmal atrial fibrillation ([place])   .  Ventricular fibrillation ([place]) 02/09/2016  . Aortic aneurysm without rupture ([place]) 02/09/2016  . Aortic insufficiency 02/09/2016  . HCAP (healthcare-associated pneumonia)   . Cardiac arrest ([place]) 02/01/2016  . Acute on chronic systolic heart failure ([place])   . Acute encephalopathy   . Thyroid activity decreased   . Acute hypoxemic respiratory failure ([place])   . Acute respiratory failure ([place])   . Encounter for central line placement   . Arrhythmia   . Altered mental status   . CKD (chronic kidney disease), stage IV ([place]) 08/06/2013    Class: Chronic  . Long term (current) use of anticoagulants 08/06/2013    Class: Chronic  . Special screening for malignant neoplasms, colon 04/11/2013  .  Colon cancer screening 03/04/2013  . Chronic anticoagulation 03/04/2013  . Atrial flutter ([place]) 03/18/2012    Class: Acute  . Chronic systolic heart failure ([place]) 02/13/2012    Class: Acute  . Hypertension, accelerated 02/13/2012  . Aortic valve regurgitation, acquired 02/13/2012  . Aortic root enlargement ([place]) 02/13/2012  . Hyperlipidemia 02/13/2012  . Left bundle branch block 02/13/2012  . Obesity (BMI 30-39.9) 02/13/2012   Earlie Counts, PT 11/02/16 5:07 PM   [facility] [address], [place], [place], [address] Phone: [phone]   Fax:  [phone]  Name: Eric Methot  MD MRN: [id number] Date of Birth: 1951-03-22

## 2016-11-03 ENCOUNTER — Telehealth: Payer: Self-pay | Admitting: *Deleted

## 2016-11-03 DIAGNOSIS — I5023 Acute on chronic systolic (congestive) heart failure: Secondary | ICD-10-CM

## 2016-11-03 DIAGNOSIS — E785 Hyperlipidemia, unspecified: Secondary | ICD-10-CM

## 2016-11-03 DIAGNOSIS — I469 Cardiac arrest, cause unspecified: Secondary | ICD-10-CM

## 2016-11-03 MED ORDER — ROSUVASTATIN CALCIUM 20 MG PO TABS: 20.0000 mg | ORAL_TABLET | Freq: Every day | ORAL | 3 refills | Status: DC

## 2016-11-03 NOTE — Telephone Encounter (Signed)
-----   Message from Belva Crome, MD sent at 11/02/2016 10:49 AM EST ----- Lipids are not acceptable. Needs to start statin therapy. Recommend Crestor 20 mg daily. We can do this or defer to Primary care.

## 2016-11-03 NOTE — Telephone Encounter (Signed)
Spoke with pt and informed him of lab results and new orders.  Pt agreeable to plan and would like for Dr. Tamala Julian to follow.  Sent in prescription to preferred pharmacy.  While speaking with pt he mentioned that Dr. Tamala Julian had spoke with him about starting cardiac rehab after he finished PT.  Pt states that PT is ending at the end of February and would like to plan for cardiac rehab.  Spoke with Dr. Tamala Julian and he said ok to send referral for cardiac rehab.  Referral sent.

## 2016-11-06 NOTE — Addendum Note (Signed)
Addended by: Loren Racer on: 11/06/2016 09:05 AM   Modules accepted: Orders

## 2016-11-07 ENCOUNTER — Ambulatory Visit: Payer: BC Managed Care – PPO | Admitting: Physical Therapy

## 2016-11-07 DIAGNOSIS — M25561 Pain in right knee: Secondary | ICD-10-CM

## 2016-11-07 DIAGNOSIS — M6281 Muscle weakness (generalized): Secondary | ICD-10-CM

## 2016-11-07 DIAGNOSIS — R2689 Other abnormalities of gait and mobility: Secondary | ICD-10-CM

## 2016-11-07 NOTE — Therapy (Signed)
[facility] [address] [place], [place], [address] Phone: [phone]   Fax:  [phone]  Physical Therapy Treatment  Patient Details  Name: Eric Venuto  MD MRN: [id number] Date of Birth: 1951/02/19 Referring Provider: Dr. Glendale Chard  Encounter Date: 11/07/2016      PT End of Session - 11/07/16 1651    Visit Number 5   Date for PT Re-Evaluation 11/17/16   Authorization Type BCBS   PT Start Time 1615   PT Stop Time 1700   PT Time Calculation (min) 45 min   Activity Tolerance Patient tolerated treatment well      Past Medical History:  Diagnosis Date  . CHF (congestive heart failure) ([place])   . Chronic kidney disease    kidney fx studies increased   . Chronic lower back pain   . Claustrophobia   . Dysrhythmia    "palpitations"  . Exertional dyspnea 01/2012  . Heart murmur   . Hypertension   . Hypothyroidism   . Migraine 02/13/12   "opthalmic"  . Varicose vein of leg    right    Past Surgical History:  Procedure Laterality Date  . CARDIAC CATHETERIZATION N/A 02/17/2016   Procedure: Left Heart Cath and Coronary Angiography;  Surgeon: Jettie Booze, MD;  Location: [place] [facility];  Service: Cardiovascular;  Laterality: N/A;  . CARDIOVERSION  03/22/2012   Procedure: CARDIOVERSION;  Surgeon: Candee Furbish, MD;  Location: [facility] ENDOSCOPY;  Service: Cardiovascular;  Laterality: N/A;  . CARDIOVERSION  04/19/2012   Procedure: CARDIOVERSION;  Surgeon: Sinclair Grooms, MD;  Location: [place];  Service: Cardiovascular;  Laterality: N/A;  . COLONOSCOPY N/A 04/11/2013   Procedure: COLONOSCOPY;  Surgeon: Inda Castle, MD;  Location: [facility] ENDOSCOPY;  Service: Endoscopy;  Laterality: N/A;  . COLONOSCOPY N/A 04/11/2013   Procedure: COLONOSCOPY;  Surgeon: Inda Castle, MD;  Location: [facility] ENDOSCOPY;  Service: Endoscopy;  Laterality: N/A;  . EP IMPLANTABLE DEVICE N/A 02/17/2016   Procedure: BiV ICD Insertion  CRT-D;  Surgeon: Evans Lance, MD;  Location: [place] [facility];  Service: Cardiovascular;  Laterality: N/A;  . FINGER SURGERY  2012   "4th digit right hand; thumb on left hand"  . RADIOLOGY WITH ANESTHESIA N/A 02/11/2016   Procedure: MRI OF THE BRAIN WITHOUT CONTRAST, LUMBAR WITHOUT CONTRAST;  Surgeon: Medication Radiologist, MD;  Location: [place];  Service: Radiology;  Laterality: N/A;  DR. WOOD/MRI  . Skin melanocytoma excision  2012   "above left clavicle"  . TEE WITHOUT CARDIOVERSION  03/22/2012   Procedure: TRANSESOPHAGEAL ECHOCARDIOGRAM (TEE);  Surgeon: Candee Furbish, MD;  Location: [facility] ENDOSCOPY;  Service: Cardiovascular;  Laterality: N/A;  . TEE WITHOUT CARDIOVERSION N/A 02/08/2016   Procedure: TRANSESOPHAGEAL ECHOCARDIOGRAM (TEE);  Surgeon: Lelon Perla, MD;  Location: [facility] ENDOSCOPY;  Service: Cardiovascular;  Laterality: N/A;    There were no vitals filed for this visit.      Subjective Assessment - 11/07/16 1621    Subjective No complaints.  Doing OK with PT.  Less stiffness following therapy sessions.  No longer taking pain medicine for several days in a row.     Currently in Pain? Yes   Pain Score 2    Pain Location Knee   Pain Orientation Right   Pain Type Chronic pain   Pain Onset More than a month ago   Pain Frequency Intermittent  [facility] Adult PT Treatment/Exercise - 11/07/16 0001      Therapeutic Activites    Therapeutic Activities Other Therapeutic Activities   Other Therapeutic Activities steps, incline walking, lunges     Knee/Hip Exercises: Aerobic   Tread Mill level 6 10 min    Nustep L4 10 min legs only  Therapist present to discuss treatment     Knee/Hip Exercises: Standing   Forward Lunges Right;1 set;10 reps;5 seconds  verbal cues to place weight onto right leg;  backward lunges   Lateral Step Up Right;1 set;10 reps;Hand Hold: 2;Step Height: 2"   Forward Step Up Right;10 reps;Hand Hold: 2;Step Height: 6"    Forward Step Up Limitations with foot on 2nd and 3rd step    Step Down Right;10 reps;Hand Hold: 2;Step Height: 2"   Walking with Sports Cord 25# forward and back 8x each                  PT Short Term Goals - 11/07/16 1704      PT SHORT TERM GOAL #1   Title independent with initial HEP    Status Achieved     PT SHORT TERM GOAL #2   Title be able to go to the gym to do the bike or elliptical for improving strength   Status Achieved     PT SHORT TERM GOAL #3   Title right knee extension AROM in sitting -30 degress due to increase strength   Status Achieved     PT SHORT TERM GOAL #4   Title pain in right leg decreased >/= 25%    Status Achieved     PT SHORT TERM GOAL #5   Title able to get in and out of car without helping his right leg due to increased strength   Status Achieved           PT Long Term Goals - 11/07/16 1704      PT LONG TERM GOAL #1   Title independent with HEP and basic gym program including understanding of  how to progress himself   Time 8   Period Weeks   Status On-going     PT LONG TERM GOAL #2   Title able to stand for 25 min with pain decreased >/= 50% so he is able to cook a meal.   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   Status Achieved     PT LONG TERM GOAL #5   Title return to household chores due to lower extremity strength >/= 4/5   Time 8   Period Weeks   Status On-going     PT LONG TERM GOAL #6   Title return to driving due to increased right lower extremity strength >/= 4-/5   Status Achieved     PT LONG TERM GOAL #7   Title pain decreased >/= 50% in right knee   Status Achieved     PT LONG TERM GOAL #8   Title The patient will be able to ambulate with SPC on the incline (and decline) of his driveway to check his mailbox   Time 8   Period  Weeks   Status On-going     PT LONG TERM GOAL  #9  TITLE The patient will be able to ascend and descend full  flight of steps at home reciprocally with cane and 1 railing   Time 8   Period Weeks   Status On-going     PT LONG TERM GOAL  #10   TITLE The patient will have improved right LE strength needed to get in/out of the bathtub using UE assist /bars as needed   Time 8   Period Weeks   Status On-going     PT LONG TERM GOAL  #11   TITLE The patient will have the right hip strength to 4+/5 needed to elevate and lower stool in the operating room   Time 8   Period Weeks   Status On-going     PT LONG TERM GOAL  #12   TITLE The patient will be able to ambulate 5 minutes on the treadmill as needed to initiate cardiac rehab program   Status Achieved               Plan - 11/07/16 1653    Clinical Impression Statement The patient is improving with knee stability although eccentric lowering is still very challenging with considerable use of UEs of railings.  Verbal cues to lessen UE support to 40%.  Cues also to avoid compensatory knee hyperextension.  Muscular fatigue after step exercises.  Therapeutic activities for ascending and descending steps as well as progressive walking on inclines.   PT Next Visit Plan quad motor control exercises;  lunges, incline walking      Patient will benefit from skilled therapeutic intervention in order to improve the following deficits and impairments:     Visit Diagnosis: Muscle weakness (generalized)  Other abnormalities of gait and mobility  Right knee pain, unspecified chronicity     Problem List Patient Active Problem List   Diagnosis Date Noted  . Long term current use of amiodarone 08/07/2016  . Right ankle pain   . Pain   . Tibial pain   . Acute idiopathic gout of right ankle   . Right knee pain   . Diabetes mellitus type 2 in obese ([place])   . Labile blood pressure   . Acute blood loss anemia   . Diabetes mellitus type  2 in nonobese ([facility])   . Debility 02/22/2016  . 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 Weakness of both lower extremities   . Lower extremity weakness   . Pneumonia   . Acute lumbar back pain   . Non-traumatic rhabdomyolysis   . Retroperitoneal bleed   . Back pain   . Leg weakness, bilateral   . Paroxysmal atrial fibrillation ([place])   . Ventricular fibrillation ([place]) 02/09/2016  . Aortic aneurysm without rupture ([place]) 02/09/2016  . Aortic insufficiency 02/09/2016  . HCAP (healthcare-associated pneumonia)   . Cardiac arrest ([place]) 02/01/2016  . Acute on chronic systolic heart failure ([place])   . Acute encephalopathy   . Thyroid activity decreased   . Acute hypoxemic respiratory failure ([place])   . Acute respiratory failure ([place])   . Encounter for central line placement   . Arrhythmia   . Altered mental status   . CKD (chronic kidney disease), stage IV ([place]) 08/06/2013    Class: Chronic  . Long term (current) use of anticoagulants 08/06/2013    Class: Chronic  . Special screening for malignant neoplasms, colon 04/11/2013  . Colon cancer screening 03/04/2013  . Chronic anticoagulation 03/04/2013  . Atrial flutter ([place]) 03/18/2012  Class: Acute  . Chronic systolic heart failure ([place]) 02/13/2012    Class: Acute  . Hypertension, accelerated 02/13/2012  . Aortic valve regurgitation, acquired 02/13/2012  . Aortic root enlargement ([place]) 02/13/2012  . Hyperlipidemia 02/13/2012  . Left bundle branch block 02/13/2012  . Obesity (BMI 30-39.9) 02/13/2012   Ruben Im, PT 11/07/16 5:07 PM Phone: [phone] Fax: [phone]  Alvera Singh 11/07/2016, 5:05 PM  [facility] [address], [place], [place], [address] Phone: [phone]   Fax:  [phone]  Name: Eric Carlberg  MD MRN: [id number] Date of Birth: Sep 02, 1951

## 2016-11-09 ENCOUNTER — Encounter: Payer: Self-pay | Admitting: Physical Therapy

## 2016-11-09 ENCOUNTER — Ambulatory Visit: Payer: BC Managed Care – PPO | Admitting: Physical Therapy

## 2016-11-09 DIAGNOSIS — M6281 Muscle weakness (generalized): Secondary | ICD-10-CM

## 2016-11-09 DIAGNOSIS — G8929 Other chronic pain: Secondary | ICD-10-CM

## 2016-11-09 DIAGNOSIS — M25561 Pain in right knee: Secondary | ICD-10-CM

## 2016-11-09 DIAGNOSIS — R2689 Other abnormalities of gait and mobility: Secondary | ICD-10-CM

## 2016-11-09 NOTE — Therapy (Signed)
[facility] [address] [place], [place], [address] Phone: [phone]   Fax:  [phone]  Physical Therapy Treatment  Patient Details  Name: Eric Delduca  MD MRN: [id number] Date of Birth: 1951/05/13 Referring Provider: Dr. Glendale Chard  Encounter Date: 11/09/2016      PT End of Session - 11/09/16 1625    Visit Number 23   Date for PT Re-Evaluation 11/17/16   Authorization Type BCBS   PT Start Time 1618   PT Stop Time 1703   PT Time Calculation (min) 45 min   Activity Tolerance Patient tolerated treatment well   Behavior During Therapy [facility] for tasks assessed/performed      Past Medical History:  Diagnosis Date  . CHF (congestive heart failure) ([place])   . Chronic kidney disease    kidney fx studies increased   . Chronic lower back pain   . Claustrophobia   . Dysrhythmia    "palpitations"  . Exertional dyspnea 01/2012  . Heart murmur   . Hypertension   . Hypothyroidism   . Migraine 02/13/12   "opthalmic"  . Varicose vein of leg    right    Past Surgical History:  Procedure Laterality Date  . CARDIAC CATHETERIZATION N/A 02/17/2016   Procedure: Left Heart Cath and Coronary Angiography;  Surgeon: Jettie Booze, MD;  Location: [place] [facility];  Service: Cardiovascular;  Laterality: N/A;  . CARDIOVERSION  03/22/2012   Procedure: CARDIOVERSION;  Surgeon: Candee Furbish, MD;  Location: [facility] ENDOSCOPY;  Service: Cardiovascular;  Laterality: N/A;  . CARDIOVERSION  04/19/2012   Procedure: CARDIOVERSION;  Surgeon: Sinclair Grooms, MD;  Location: [place];  Service: Cardiovascular;  Laterality: N/A;  . COLONOSCOPY N/A 04/11/2013   Procedure: COLONOSCOPY;  Surgeon: Inda Castle, MD;  Location: [facility] ENDOSCOPY;  Service: Endoscopy;  Laterality: N/A;  . COLONOSCOPY N/A 04/11/2013   Procedure: COLONOSCOPY;  Surgeon: Inda Castle, MD;  Location: [facility] ENDOSCOPY;  Service: Endoscopy;  Laterality: N/A;  . EP  IMPLANTABLE DEVICE N/A 02/17/2016   Procedure: BiV ICD Insertion CRT-D;  Surgeon: Evans Lance, MD;  Location: [place] [facility];  Service: Cardiovascular;  Laterality: N/A;  . FINGER SURGERY  2012   "4th digit right hand; thumb on left hand"  . RADIOLOGY WITH ANESTHESIA N/A 02/11/2016   Procedure: MRI OF THE BRAIN WITHOUT CONTRAST, LUMBAR WITHOUT CONTRAST;  Surgeon: Medication Radiologist, MD;  Location: [place];  Service: Radiology;  Laterality: N/A;  DR. WOOD/MRI  . Skin melanocytoma excision  2012   "above left clavicle"  . TEE WITHOUT CARDIOVERSION  03/22/2012   Procedure: TRANSESOPHAGEAL ECHOCARDIOGRAM (TEE);  Surgeon: Candee Furbish, MD;  Location: [facility] ENDOSCOPY;  Service: Cardiovascular;  Laterality: N/A;  . TEE WITHOUT CARDIOVERSION N/A 02/08/2016   Procedure: TRANSESOPHAGEAL ECHOCARDIOGRAM (TEE);  Surgeon: Lelon Perla, MD;  Location: [facility] ENDOSCOPY;  Service: Cardiovascular;  Laterality: N/A;    There were no vitals filed for this visit.      Subjective Assessment - 11/09/16 1624    Subjective Pt states nothing new to report. Has not attempted walking drive way. Does think hes doing better with stairs. Denies pain at the moment, just tight.    Patient is accompained by: Family member   How long can you sit comfortably? sitting for 45 min and right leg gets numb   How long can you stand comfortably? without walker 60 sec.    How long can you walk comfortably? [place]  min    Patient Stated Goals regain previous mobility.  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 No/denies   Pain Score 0-No pain                         [facility] Adult PT Treatment/Exercise - 11/09/16 0001      Knee/Hip Exercises: Stretches   Sports administrator Right;3 reps;10 seconds  on stairs   Gastroc Stretch 2 reps;Both;10 seconds     Knee/Hip Exercises: Aerobic   Stationary Bike L3 x 7 minutes   Tread Mill level 9 6 min       Knee/Hip Exercises: Machines for Strengthening   Total Gym Leg Press Seat 7; Bil 100# 30x, RTLE 60#  30x     Knee/Hip Exercises: Standing   Forward Lunges Right;1 set;10 reps;5 seconds  verbal cues to place weight onto right leg;  backward lunges   Lateral Step Up Right;1 set;10 reps;Hand Hold: 2;Step Height: 6"   Forward Step Up Right;10 reps;Hand Hold: 2;Step Height: 6"   Step Down Right;10 reps;Hand Hold: 2;Step Height: 2"   Walking with Sports Cord 25# forward and back 8x each  #20 side to side                  PT Short Term Goals - 11/07/16 1704      PT SHORT TERM GOAL #1   Title independent with initial HEP    Status Achieved     PT SHORT TERM GOAL #2   Title be able to go to the gym to do the bike or elliptical for improving strength   Status Achieved     PT SHORT TERM GOAL #3   Title right knee extension AROM in sitting -30 degress due to increase strength   Status Achieved     PT SHORT TERM GOAL #4   Title pain in right leg decreased >/= 25%    Status Achieved     PT SHORT TERM GOAL #5   Title able to get in and out of car without helping his right leg due to increased strength   Status Achieved           PT Long Term Goals - 11/07/16 1704      PT LONG TERM GOAL #1   Title independent with HEP and basic gym program including understanding of  how to progress himself   Time 8   Period Weeks   Status On-going     PT LONG TERM GOAL #2   Title able to stand for 25 min with pain decreased >/= 50% so he is able to cook a meal.   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   Status Achieved     PT LONG TERM GOAL #5   Title return to household chores due to lower extremity strength >/= 4/5   Time 8   Period Weeks   Status On-going     PT LONG TERM GOAL #6   Title return  to driving due to increased right lower extremity strength >/= 4-/5   Status Achieved     PT  LONG TERM GOAL #7   Title pain decreased >/= 50% in right knee   Status Achieved     PT LONG TERM GOAL #8   Title The patient will be able to ambulate with SPC on the incline (and decline) of his driveway to check his mailbox   Time 8   Period Weeks   Status On-going     PT LONG TERM GOAL  #9   TITLE The patient will be able to ascend and descend full  flight of steps at home reciprocally with cane and 1 railing   Time 8   Period Weeks   Status On-going     PT LONG TERM GOAL  #10   TITLE The patient will have improved right LE strength needed to get in/out of the bathtub using UE assist /bars as needed   Time 8   Period Weeks   Status On-going     PT LONG TERM GOAL  #11   TITLE The patient will have the right hip strength to 4+/5 needed to elevate and lower stool in the operating room   Time 8   Period Weeks   Status On-going     PT LONG TERM GOAL  #12   TITLE The patient will be able to ambulate 5 minutes on the treadmill as needed to initiate cardiac rehab program   Status Achieved               Plan - 11/09/16 1627    Clinical Impression Statement Pt able to reach level 9 incline for 1 minute on treadmill today. Pt demonstrates good quad control and continues to increase in endurance and strength. Pt will continue to benefit from skilled therapy for LE strength and stability for progression of goals.   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   PT Frequency 2x / week   PT Duration 8 weeks   PT Treatment/Interventions ADLs/Self Care Home Management;Cryotherapy;Electrical Stimulation;Functional mobility training;Stair training;Gait training;Ultrasound;Moist Heat;Therapeutic activities;Therapeutic exercise;Balance training;Neuromuscular re-education;Patient/family  education;Passive range of motion;Manual techniques;Energy conservation;Other (comment)   PT Next Visit Plan Quad strength, incline    Consulted and Agree with Plan of Care Patient      Patient will benefit from skilled therapeutic intervention in order to improve the following deficits and impairments:  Abnormal gait, Difficulty walking, Decreased endurance, Pain, Decreased activity tolerance, Decreased balance, Decreased strength, Decreased mobility  Visit Diagnosis: Muscle weakness (generalized)  Other abnormalities of gait and mobility  Right knee pain, unspecified chronicity  Chronic pain of right knee     Problem List Patient Active Problem List   Diagnosis Date Noted  . Long term current use of amiodarone 08/07/2016  . Right ankle pain   . Pain   . Tibial pain   . Acute idiopathic gout of right ankle   . Right knee pain   . Diabetes mellitus type 2 in obese ([place])   . Labile blood pressure   . Acute blood loss anemia   . Diabetes mellitus type 2 in nonobese (H[facility])   . Debility 02/22/2016  . 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 Weakness of both lower extremities   . Lower extremity weakness   . Pneumonia   . Acute lumbar back pain   . Non-traumatic rhabdomyolysis   . Retroperitoneal bleed   . Back pain   . Leg weakness, bilateral   . Paroxysmal atrial fibrillation ([place])   .  Ventricular fibrillation ([place]) 02/09/2016  . Aortic aneurysm without rupture ([place]) 02/09/2016  . Aortic insufficiency 02/09/2016  . HCAP (healthcare-associated pneumonia)   . Cardiac arrest ([place]) 02/01/2016  . Acute on chronic systolic heart failure ([place])   . Acute encephalopathy   . Thyroid activity decreased   . Acute hypoxemic respiratory failure ([place])   . Acute respiratory failure ([place])   . Encounter for central line placement   . Arrhythmia   . Altered mental status   . CKD (chronic kidney disease), stage IV ([place]) 08/06/2013    Class: Chronic  . Long term  (current) use of anticoagulants 08/06/2013    Class: Chronic  . Special screening for malignant neoplasms, colon 04/11/2013  . Colon cancer screening 03/04/2013  . Chronic anticoagulation 03/04/2013  . Atrial flutter ([place]) 03/18/2012    Class: Acute  . Chronic systolic heart failure ([place]) 02/13/2012    Class: Acute  . Hypertension, accelerated 02/13/2012  . Aortic valve regurgitation, acquired 02/13/2012  . Aortic root enlargement ([place]) 02/13/2012  . Hyperlipidemia 02/13/2012  . Left bundle branch block 02/13/2012  . Obesity (BMI 30-39.9) 02/13/2012    Mikle Bosworth PTA 11/09/2016, 5:06 PM  [facility] [address], [place], [place], [address] Phone: [phone]   Fax:  [phone]  Name: Eric Jarnigan  MD MRN: [id number] Date of Birth: 1951-09-19

## 2016-11-14 ENCOUNTER — Encounter: Payer: Self-pay | Admitting: Physical Therapy

## 2016-11-14 ENCOUNTER — Ambulatory Visit: Payer: BC Managed Care – PPO | Admitting: Physical Therapy

## 2016-11-14 DIAGNOSIS — M6281 Muscle weakness (generalized): Secondary | ICD-10-CM | POA: Diagnosis not present

## 2016-11-14 DIAGNOSIS — G8929 Other chronic pain: Secondary | ICD-10-CM

## 2016-11-14 DIAGNOSIS — M25561 Pain in right knee: Secondary | ICD-10-CM

## 2016-11-14 DIAGNOSIS — R2689 Other abnormalities of gait and mobility: Secondary | ICD-10-CM

## 2016-11-14 NOTE — Therapy (Signed)
[facility] [address], [place], [place], [address] Phone: [phone]   Fax:  [phone]  Physical Therapy Treatment  Patient Details  Name: Eric [place]  MD MRN: [id number] Date of Birth: 12/17/1950 Referring Provider: Dr. Glendale Chard  Encounter Date: 11/14/2016      PT End of Session - 11/14/16 1633    Visit Number 34   Date for PT Re-Evaluation 11/17/16   Authorization Type BCBS   PT Start Time 1615   PT Stop Time 1657   PT Time Calculation (min) 42 min   Activity Tolerance Patient tolerated treatment well   Behavior During Therapy [facility] for tasks assessed/performed      Past Medical History:  Diagnosis Date  . CHF (congestive heart failure) ([place])   . Chronic kidney disease    kidney fx studies increased   . Chronic lower back pain   . Claustrophobia   . Dysrhythmia    "palpitations"  . Exertional dyspnea 01/2012  . Heart murmur   . Hypertension   . Hypothyroidism   . Migraine 02/13/12   "opthalmic"  . Varicose vein of leg    right    Past Surgical History:  Procedure Laterality Date  . CARDIAC CATHETERIZATION N/A 02/17/2016   Procedure: Left Heart Cath and Coronary Angiography;  Surgeon: Jettie Booze, MD;  Location: [place] [facility];  Service: Cardiovascular;  Laterality: N/A;  . CARDIOVERSION  03/22/2012   Procedure: CARDIOVERSION;  Surgeon: Candee Furbish, MD;  Location: [facility] ENDOSCOPY;  Service: Cardiovascular;  Laterality: N/A;  . CARDIOVERSION  04/19/2012   Procedure: CARDIOVERSION;  Surgeon: Sinclair Grooms, MD;  Location: [place];  Service: Cardiovascular;  Laterality: N/A;  . COLONOSCOPY N/A 04/11/2013   Procedure: COLONOSCOPY;  Surgeon: Inda Castle, MD;  Location: [facility]OSCOPY;  Service: Endoscopy;  Laterality: N/A;  . COLONOSCOPY N/A 04/11/2013   Procedure: COLONOSCOPY;  Surgeon: Inda Castle, MD;  Location: [facility] ENDOSCOPY;  Service: Endoscopy;  Laterality: N/A;  . EP  IMPLANTABLE DEVICE N/A 02/17/2016   Procedure: BiV ICD Insertion CRT-D;  Surgeon: Evans Lance, MD;  Location: [place] [facility];  Service: Cardiovascular;  Laterality: N/A;  . FINGER SURGERY  2012   "4th digit right hand; thumb on left hand"  . RADIOLOGY WITH ANESTHESIA N/A 02/11/2016   Procedure: MRI OF THE BRAIN WITHOUT CONTRAST, LUMBAR WITHOUT CONTRAST;  Surgeon: Medication Radiologist, MD;  Location: [facility];  Service: Radiology;  Laterality: N/A;  DR. WOOD/MRI  . Skin melanocytoma excision  2012   "above left clavicle"  . TEE WITHOUT CARDIOVERSION  03/22/2012   Procedure: TRANSESOPHAGEAL ECHOCARDIOGRAM (TEE);  Surgeon: Candee Furbish, MD;  Location: [facility] ENDOSCOPY;  Service: Cardiovascular;  Laterality: N/A;  . TEE WITHOUT CARDIOVERSION N/A 02/08/2016   Procedure: TRANSESOPHAGEAL ECHOCARDIOGRAM (TEE);  Surgeon: Lelon Perla, MD;  Location: [facility] ENDOSCOPY;  Service: Cardiovascular;  Laterality: N/A;    There were no vitals filed for this visit.      Subjective Assessment - 11/14/16 1625    Subjective Pt is going to continue with cardiac rehab in March. Knee feeling tight.    Patient is accompained by: Family member   How long can you sit comfortably? sitting for 45 min and right leg gets numb   How long can you stand comfortably? without walker 60 sec.    How long can you walk comfortably? 30 min    Patient Stated Goals regain previous mobility.  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 cooking, making bed, washing dishes    Currently in Pain? Yes   Pain Score 4    Pain Location Knee   Pain Orientation Right   Pain Descriptors / Indicators Tightness   Pain Type Chronic pain   Pain Radiating Towards Down Rt leg   Pain Onset More than a month ago   Pain Frequency Intermittent   Aggravating Factors  walking>30 minutes   Pain Relieving Factors Rest, spinning the bike                         [facility] Adult PT Treatment/Exercise - 11/14/16 0001       Knee/Hip Exercises: Stretches   Education officer, community --     Knee/Hip Exercises: Aerobic   Stationary Bike L3 x 8 minutes   Tread Mill level 10 incline 6 min      Knee/Hip Exercises: Standing   Forward Lunges Right;1 set;10 reps;5 seconds  verbal cues to place weight onto right leg;  backward lunges   Walking with Sports Cord 25# forward and back 8x each  #20 side to side                  PT Short Term Goals - 11/07/16 1704      PT SHORT TERM GOAL #1   Title independent with initial HEP    Status Achieved     PT SHORT TERM GOAL #2   Title be able to go to the gym to do the bike or elliptical for improving strength   Status Achieved     PT SHORT TERM GOAL #3   Title right knee extension AROM in sitting -30 degress due to increase strength   Status Achieved     PT SHORT TERM GOAL #4   Title pain in right leg decreased >/= 25%    Status Achieved     PT SHORT TERM GOAL #5   Title able to get in and out of car without helping his right leg due to increased strength   Status Achieved           PT Long Term Goals - 11/14/16 1634      PT LONG TERM GOAL #5   Title return to household chores due to lower extremity strength >/= 4/5   Time 8   Period Weeks   Status On-going     PT LONG TERM GOAL #6   Title return to driving due to increased right lower extremity strength >/= 4-/5   Time 8   Period Weeks   Status Achieved     PT LONG TERM GOAL #8   Title The patient will be able to ambulate with SPC on the incline (and decline) of his driveway to check his mailbox   Time 8   Period Weeks   Status On-going     PT LONG TERM GOAL  #9   TITLE The patient will be able to ascend and descend full  flight of steps at home reciprocally with cane and 1 railing   Time 8   Period Weeks   Status On-going     PT LONG TERM GOAL  #10   TITLE The patient will have improved right LE strength needed to get in/out of the bathtub using UE assist /bars as  needed   Time 8   Period Weeks   Status On-going  PT LONG TERM GOAL  #11   TITLE The patient will have the right hip strength to 4+/5 needed to elevate and lower stool in the operating room   Time 8   Period Weeks   Status On-going     PT LONG TERM GOAL  #12   TITLE The patient will be able to ambulate 5 minutes on the treadmill as needed to initiate cardiac rehab program   Time 8   Period Weeks   Status Achieved               Plan - 11/14/16 1654    Clinical Impression Statement Pt able to tolerate all exercises well wth good quad control. Some muscle fatigue. Pt continues to have stiffness and weakness in Rt knee limiting ADL's at home. Pt will continue to benefit from skilled therapy for LE strength and progression towards long term goals.    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   PT Frequency 2x / week   PT Duration 8 weeks   PT Treatment/Interventions ADLs/Self Care Home Management;Cryotherapy;Electrical Stimulation;Functional mobility training;Stair training;Gait training;Ultrasound;Moist Heat;Therapeutic activities;Therapeutic exercise;Balance training;Neuromuscular re-education;Patient/family education;Passive range of motion;Manual techniques;Energy conservation;Other (comment)   PT Next Visit Plan Quad strength, incline    Consulted and Agree with Plan of Care Patient      Patient will benefit from skilled therapeutic intervention in order to improve the following deficits and impairments:  Abnormal gait, Difficulty walking, Decreased endurance, Pain, Decreased activity tolerance, Decreased balance, Decreased strength, Decreased mobility  Visit Diagnosis: Muscle weakness (generalized)  Other abnormalities of gait and mobility  Right knee pain, unspecified chronicity  Chronic pain of right knee     Problem List Patient Active Problem List   Diagnosis  Date Noted  . Long term current use of amiodarone 08/07/2016  . Right ankle pain   . Pain   . Tibial pain   . Acute idiopathic gout of right ankle   . Right knee pain   . Diabetes mellitus type 2 in obese ([place])   . Labile blood pressure   . Acute blood loss anemia   . Diabetes mellitus type 2 in nonobese (H[facility])   . Debility 02/22/2016  . 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 Weakness of both lower extremities   . Lower extremity weakness   . Pneumonia   . Acute lumbar back pain   . Non-traumatic rhabdomyolysis   . Retroperitoneal bleed   . Back pain   . Leg weakness, bilateral   . Paroxysmal atrial fibrillation ([place])   . Ventricular fibrillation ([place]) 02/09/2016  . Aortic aneurysm without rupture ([place]) 02/09/2016  . Aortic insufficiency 02/09/2016  . HCAP (healthcare-associated pneumonia)   . Cardiac arrest ([place]) 02/01/2016  . Acute on chronic systolic heart failure ([place])   . Acute encephalopathy   . Thyroid activity decreased   . Acute hypoxemic respiratory failure ([place])   . Acute respiratory failure ([place])   . Encounter for central line placement   . Arrhythmia   . Altered mental status   . CKD (chronic kidney disease), stage IV ([place]) 08/06/2013    Class: Chronic  . Long term (current) use of anticoagulants 08/06/2013    Class: Chronic  . Special screening for malignant neoplasms, colon 04/11/2013  . Colon cancer screening 03/04/2013  . Chronic anticoagulation 03/04/2013  . Atrial flutter ([place]) 03/18/2012    Class: Acute  . Chronic systolic  heart failure ([place]) 02/13/2012    Class: Acute  . Hypertension, accelerated 02/13/2012  . Aortic valve regurgitation, acquired 02/13/2012  . Aortic root enlargement ([place]) 02/13/2012  . Hyperlipidemia 02/13/2012  . Left bundle branch block 02/13/2012  . Obesity (BMI 30-39.9) 02/13/2012    Mikle Bosworth PTA 11/14/2016, 4:59 PM  [facility] [address] [place], [place], [address] Phone: [phone]   Fax:  [phone]  Name: Tyrell Seifer  MD MRN: [id number] Date of Birth: 08/17/1951

## 2016-11-16 ENCOUNTER — Encounter: Payer: Self-pay | Admitting: Physical Therapy

## 2016-11-16 ENCOUNTER — Ambulatory Visit: Payer: BC Managed Care – PPO | Admitting: Physical Therapy

## 2016-11-16 DIAGNOSIS — M6281 Muscle weakness (generalized): Secondary | ICD-10-CM | POA: Diagnosis not present

## 2016-11-16 DIAGNOSIS — R2689 Other abnormalities of gait and mobility: Secondary | ICD-10-CM

## 2016-11-16 NOTE — Patient Instructions (Signed)
Long [employer]    Straighten operated leg and try to hold it __2__ seconds. Use __0__ lbs on ankle. Repeat _15___ times. Do __2__ sessions a day.  http://gt2.exer.us/310   Copyright  VHI. All rights reserved.   Sitting to Standing    With straight back, tighten stomach, place right leg back under chair, lean slightly forward and stand. Repeat __10__ times per set. Do __1__ sets per session. Do ___1_ sessions per day. Do when you get out of a chair. Most weight on right.  http://orth.exer.us/1140   Copyright  VHI. All rights reserved.  Forward    Facing step, place one leg on step, flexed at hip. Step up slowly, bringing hips in line with knee and shoulder. Bring other foot onto step. Reverse process to step back down. Repeat with other leg. Do _15___ repetitions, _1___ sets. Use handrails  Knee Extension: Terminal - Standing (Single Leg)    Face anchor in shoulder width stance, band around knee. Allow tension of band to slightly bend knee. Pull leg back, straightening knee. Repeat _20_ times per set. Repeat with other leg. Do _1_ sets per session. Do _1_ sessions per week. Anchor Height: Knee  http://tub.exer.us/35   Copyright  VHI. All rights reserved.   [facility] [address] [place], [place] [address] Phone # [phone] Fax [phone]

## 2016-11-16 NOTE — Therapy (Signed)
[facility] [address], [place], [place], [address] Phone: [phone]   Fax:  [phone]  Physical Therapy Treatment  Patient Details  Name: Eric Schiele  MD MRN: [id number] Date of Birth: May 16, 1951 Referring Provider: Dr. Glendale Chard  Encounter Date: 11/16/2016      PT End of Session - 11/16/16 1628    Visit Number 18   Date for PT Re-Evaluation 11/17/16   PT Start Time 1535  patient late   PT Stop Time 1615   PT Time Calculation (min) 40 min   Activity Tolerance Patient tolerated treatment well   Behavior During Therapy [facility] for tasks assessed/performed      Past Medical History:  Diagnosis Date  . CHF (congestive heart failure) ([place])   . Chronic kidney disease    kidney fx studies increased   . Chronic lower back pain   . Claustrophobia   . Dysrhythmia    "palpitations"  . Exertional dyspnea 01/2012  . Heart murmur   . Hypertension   . Hypothyroidism   . Migraine 02/13/12   "opthalmic"  . Varicose vein of leg    right    Past Surgical History:  Procedure Laterality Date  . CARDIAC CATHETERIZATION N/A 02/17/2016   Procedure: Left Heart Cath and Coronary Angiography;  Surgeon: Jettie Booze, MD;  Location: [place] CV LAB;  Service: Cardiovascular;  Laterality: N/A;  . CARDIOVERSION  03/22/2012   Procedure: CARDIOVERSION;  Surgeon: Candee Furbish, MD;  Location: [facility] ENDOSCOPY;  Service: Cardiovascular;  Laterality: N/A;  . CARDIOVERSION  04/19/2012   Procedure: CARDIOVERSION;  Surgeon: Sinclair Grooms, MD;  Location: [place];  Service: Cardiovascular;  Laterality: N/A;  . COLONOSCOPY N/A 04/11/2013   Procedure: COLONOSCOPY;  Surgeon: Inda Castle, MD;  Location: [facility] ENDOSCOPY;  Service: Endoscopy;  Laterality: N/A;  . COLONOSCOPY N/A 04/11/2013   Procedure: COLONOSCOPY;  Surgeon: Inda Castle, MD;  Location: [facility] ENDOSCOPY;  Service: Endoscopy;  Laterality: N/A;  . EP IMPLANTABLE DEVICE  N/A 02/17/2016   Procedure: BiV ICD Insertion CRT-D;  Surgeon: Evans Lance, MD;  Location: [place] CV [facility];  Service: Cardiovascular;  Laterality: N/A;  . FINGER SURGERY  2012   "4th digit right hand; thumb on left hand"  . RADIOLOGY WITH ANESTHESIA N/A 02/11/2016   Procedure: MRI OF THE BRAIN WITHOUT CONTRAST, LUMBAR WITHOUT CONTRAST;  Surgeon: Medication Radiologist, MD;  Location: [place];  Service: Radiology;  Laterality: N/A;  DR. WOOD/MRI  . Skin melanocytoma excision  2012   "above left clavicle"  . TEE WITHOUT CARDIOVERSION  03/22/2012   Procedure: TRANSESOPHAGEAL ECHOCARDIOGRAM (TEE);  Surgeon: Candee Furbish, MD;  Location: [facility] ENDOSCOPY;  Service: Cardiovascular;  Laterality: N/A;  . TEE WITHOUT CARDIOVERSION N/A 02/08/2016   Procedure: TRANSESOPHAGEAL ECHOCARDIOGRAM (TEE);  Surgeon: Lelon Perla, MD;  Location: [facility] ENDOSCOPY;  Service: Cardiovascular;  Laterality: N/A;    There were no vitals filed for this visit.      Subjective Assessment - 11/16/16 1548    Subjective I have a membership at the [facility].    Patient is accompained by: Family member   How long can you sit comfortably? sitting for 45 min and right leg gets numb   How long can you stand comfortably? without walker 60 sec.    How long can you walk comfortably? 30 min    Patient Stated Goals regain previous mobility.  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 cooking, making bed, washing dishes    Currently in Pain? No/denies            [facility] PT Assessment - 11/16/16 0001      Assessment   Medical Diagnosis R26.9 Abnormlities of gait   Referring Provider Dr. Glendale Chard   Onset Date/Surgical Date 01/31/16   Prior Therapy home health     Precautions   Precautions ICD/Pacemaker     Balance Screen   Has the patient fallen in the past 6 months No   Has the patient had a decrease in activity level because of a fear of falling?  No   Is the patient reluctant to leave their home because  of a fear of falling?  No     Cognition   Overall Cognitive Status Within Functional Limits for tasks assessed     Observation/Other Assessments   Focus on Therapeutic Outcomes (FOTO)  25% limitation due ot decreased right knee extension     AROM   Right Knee Extension -15     Strength   Right Hip Flexion 4+/5   Right Hip ABduction 5/5   Right Knee Flexion 5/5   Right Knee Extension 2+/5                     [facility] Adult PT Treatment/Exercise - 11/16/16 0001      Therapeutic Activites    Therapeutic Activities ADL's   Other Therapeutic Activities getting up and down from the floor                PT Education - 11/16/16 1628    Education provided Yes   Education Details knee strengthening   Person(s) Educated Patient   Methods Explanation;Demonstration;Handout   Comprehension Returned demonstration;Verbalized understanding          PT Short Term Goals - 11/07/16 1704      PT SHORT TERM GOAL #1   Title independent with initial HEP    Status Achieved     PT SHORT TERM GOAL #2   Title be able to go to the gym to do the bike or elliptical for improving strength   Status Achieved     PT SHORT TERM GOAL #3   Title right knee extension AROM in sitting -30 degress due to increase strength   Status Achieved     PT SHORT TERM GOAL #4   Title pain in right leg decreased >/= 25%    Status Achieved     PT SHORT TERM GOAL #5   Title able to get in and out of car without helping his right leg due to increased strength   Status Achieved           PT Long Term Goals - 11/16/16 1549      PT LONG TERM GOAL #1   Title independent with HEP and basic gym program including understanding of  how to progress himself   Time 8   Period Weeks   Status Achieved     PT LONG TERM GOAL #2   Title able to stand for 25 min with pain decreased >/= 50% so he is able to cook a meal.   Time 8   Period Weeks   Status Achieved     PT LONG TERM GOAL #3   Title  going up and down stairs with step to step with a cane   Time 8   Period Weeks   Status Achieved  PT LONG TERM GOAL #4   Title sit on stool and scoot around office to assess patients   Time 8   Period Weeks   Status Achieved     PT LONG TERM GOAL #5   Title return to household chores due to lower extremity strength >/= 4/5   Baseline sweeping, vacuuming, mopping, washing dishes. Unable to take trash to end of driveway.   Time 8   Period Weeks   Status Not Met     PT LONG TERM GOAL #6   Title return to driving due to increased right lower extremity strength >/= 4-/5   Time 8   Period Weeks   Status Achieved     PT LONG TERM GOAL #7   Title pain decreased >/= 50% in right knee   Time 8   Period Weeks   Status Achieved     PT LONG TERM GOAL #8   Title The patient will be able to ambulate with SPC on the incline (and decline) of his driveway to check his mailbox   Time 8   Period Weeks   Status Not Met  driveway is too steep     PT LONG TERM GOAL  #9   TITLE The patient will be able to ascend and descend full  flight of steps at home reciprocally with cane and 1 railing   Time 8   Period Weeks   Status Achieved     PT LONG TERM GOAL  #10   TITLE The patient will have improved right LE strength needed to get in/out of the bathtub using UE assist /bars as needed   Baseline Has not tried yet   Time 8   Period Weeks   Status Not Met     PT LONG TERM GOAL  #11   TITLE The patient will have the right hip strength to 4+/5 needed to elevate and lower stool in the operating room   Time 8   Period Weeks   Status Achieved     PT LONG TERM GOAL  #12   TITLE The patient will be able to ambulate 5 minutes on the treadmill as needed to initiate cardiac rehab program   Time 8   Period Weeks   Status Achieved               Plan - 11/16/16 1628    Clinical Impression Statement Patient has met goals except for full knee extension.  Patient can extend to -15 degrees  and has atrophy of right quadriceps.  Patient walks with decreased right knee extension due to weakness.  Patient has full right hip strength.  Patient is a member of [employer]  and will be exercising there.  Patient  will start cardiac rehab on 12/05/2016. Patient unable to walk down steep incline on his driveway due to decreased strength of right knee. Patient    Clinical Impairments Affecting Rehab Potential bilateral retroperitioneal hemorrhage with femoral nerve involvement   PT Treatment/Interventions ADLs/Self Care Home Management;Cryotherapy;Electrical Stimulation;Functional mobility training;Stair training;Gait training;Ultrasound;Moist Heat;Therapeutic activities;Therapeutic exercise;Balance training;Neuromuscular re-education;Patient/family education;Passive range of motion;Manual techniques;Energy conservation;Other (comment)   PT Next Visit Plan Discharge to cardiac rehab starting 12/05/2016   PT Home Exercise Plan Current HEP   Consulted and Agree with Plan of Care Patient      Patient will benefit from skilled therapeutic intervention in order to improve the following deficits and impairments:  Abnormal gait, Difficulty walking, Decreased endurance, Pain, Decreased activity tolerance, Decreased balance, Decreased  strength, Decreased mobility  Visit Diagnosis: Muscle weakness (generalized)  Other abnormalities of gait and mobility     Problem List Patient Active Problem List   Diagnosis Date Noted  . Long term current use of amiodarone 08/07/2016  . Right ankle pain   . Pain   . Tibial pain   . Acute idiopathic gout of right ankle   . Right knee pain   . Diabetes mellitus type 2 in obese ([place])   . Labile blood pressure   . Acute blood loss anemia   . Diabetes mellitus type 2 in nonobese ([facility])   . Debility 02/22/2016  . 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 Weakness of both lower extremities   . Lower extremity weakness   . Pneumonia   . Acute lumbar  back pain   . Non-traumatic rhabdomyolysis   . Retroperitoneal bleed   . Back pain   . Leg weakness, bilateral   . Paroxysmal atrial fibrillation ([place])   . Ventricular fibrillation ([place]) 02/09/2016  . Aortic aneurysm without rupture ([place]) 02/09/2016  . Aortic insufficiency 02/09/2016  . HCAP (healthcare-associated pneumonia)   . Cardiac arrest ([place]) 02/01/2016  . Acute on chronic systolic heart failure ([place])   . Acute encephalopathy   . Thyroid activity decreased   . Acute hypoxemic respiratory failure ([place])   . Acute respiratory failure ([place])   . Encounter for central line placement   . Arrhythmia   . Altered mental status   . CKD (chronic kidney disease), stage IV ([place]) 08/06/2013    Class: Chronic  . Long term (current) use of anticoagulants 08/06/2013    Class: Chronic  . Special screening for malignant neoplasms, colon 04/11/2013  . Colon cancer screening 03/04/2013  . Chronic anticoagulation 03/04/2013  . Atrial flutter ([place]) 03/18/2012    Class: Acute  . Chronic systolic heart failure ([place]) 02/13/2012    Class: Acute  . Hypertension, accelerated 02/13/2012  . Aortic valve regurgitation, acquired 02/13/2012  . Aortic root enlargement ([place]) 02/13/2012  . Hyperlipidemia 02/13/2012  . Left bundle branch block 02/13/2012  . Obesity (BMI 30-39.9) 02/13/2012    Earlie Counts, PT 11/16/16 4:46 PM   [facility] [address], [place], [place], [address] Phone: [phone]   Fax:  [phone]  Name: Eric Furness  MD MRN: [id number] Date of Birth: [birth year] PHYSICAL THERAPY DISCHARGE SUMMARY  Visits from Start of Care: 75  Current functional level related to goals / functional outcomes: See above.  Patient unable to walk down driveway due to steep incline.  Right knee extension AROM is -15 degrees with right quad atrophy.    Remaining deficits: See above.    Education /  Equipment: HEP Plan: Patient agrees to discharge.  Patient goals were partially met. Patient is being discharged due to meeting the stated rehab goals.  Thank you for the referral. Patient will be attending Cardiac Rehab on 12/05/2016. Earlie Counts, PT 11/16/16 4:45 PM  ?????

## 2016-11-17 ENCOUNTER — Telehealth (HOSPITAL_COMMUNITY): Payer: Self-pay | Admitting: Internal Medicine

## 2016-11-17 ENCOUNTER — Encounter: Payer: Self-pay | Admitting: Cardiology

## 2016-11-17 NOTE — Telephone Encounter (Signed)
Verified patient insurance benefits. Per [employer]: No-copayment, Deductible is $1250.00/$12500.00 has been met, out of pocket $4350.00/$1415.29 has been met. There is no co-insurance, no pre-authorization, and no limit on visit. Passport/reference # [phone]. Per [employer], insurance information given to her and she will contact patient.

## 2016-11-23 ENCOUNTER — Telehealth (HOSPITAL_COMMUNITY): Payer: Self-pay | Admitting: *Deleted

## 2016-11-23 NOTE — Telephone Encounter (Signed)
-----   Message from Monico Hoar, PT sent at 11/13/2016  9:18 AM EST ----- Regarding: RE: Readiness to participate in cardiac rehab He is being discharged from physical therapy this week so he can start cardiac rehab next week.  He is able to do all of the exercise activities you have stated below.  He uses a Single point cane due to weakness in right knee.  The strength is coming slowly for right knee extension.  He will do great in your program.  Earlie Counts ----- Message ----- From: Rowe Pavy, RN Sent: 11/13/2016   8:52 AM To: Alvera Singh, PT, Monico Hoar, PT Subject: Readiness to participate in cardiac rehab      The above patient was referred to outpatient cardiac rehab s/p cardiac arrest. I reviewed  your notes and pending future appts through this month.  When do you anticipate this patient will be ready to participate in rehab group exercise classes three times a week? As far as mobility what assistive devices if any will he need?  Based upon your assessments and treatments what exercise activities will he be able to participate in.  We have track, treadmill, stationary bike and recumbent bike, recumbent stepper, arm crank, rower, elliptical and hand weights.   Thank you for your input   Maurice Small RN BSN

## 2016-11-30 ENCOUNTER — Telehealth: Payer: Self-pay | Admitting: Internal Medicine

## 2016-11-30 NOTE — Telephone Encounter (Signed)
New Message  Pt call requesting to speak with RN about instructions on sending transmission on 3/9. Please call back to discuss

## 2016-11-30 NOTE — Telephone Encounter (Signed)
Spoke w/ pt and confirmed remote transmission appt for tomorrow. Pt verbalized understanding.

## 2016-12-01 ENCOUNTER — Telehealth: Payer: Self-pay | Admitting: Cardiology

## 2016-12-01 ENCOUNTER — Ambulatory Visit (INDEPENDENT_AMBULATORY_CARE_PROVIDER_SITE_OTHER): Payer: Self-pay | Admitting: *Deleted

## 2016-12-01 DIAGNOSIS — I4901 Ventricular fibrillation: Secondary | ICD-10-CM

## 2016-12-01 LAB — CUP PACEART REMOTE DEVICE CHECK
Battery Remaining Longevity: 80 mo
Battery Remaining Percentage: 89 %
Battery Voltage: 3.08 V
Brady Statistic AP VP Percent: 4.7 %
Brady Statistic RA Percent Paced: 4.6 %
HIGH POWER IMPEDANCE MEASURED VALUE: 72 Ohm
HighPow Impedance: 72 Ohm
Implantable Lead Location: 753858
Implantable Lead Location: 753860
Implantable Pulse Generator Implant Date: 20170525
Lead Channel Impedance Value: 450 Ohm
Lead Channel Impedance Value: 480 Ohm
Lead Channel Impedance Value: 780 Ohm
Lead Channel Pacing Threshold Amplitude: 0.625 V
Lead Channel Pacing Threshold Amplitude: 1.125 V
Lead Channel Pacing Threshold Pulse Width: 0.5 ms
Lead Channel Sensing Intrinsic Amplitude: 11.4 mV
Lead Channel Setting Pacing Amplitude: 2 V
Lead Channel Setting Pacing Amplitude: 2.125
Lead Channel Setting Pacing Pulse Width: 0.5 ms
Lead Channel Setting Sensing Sensitivity: 0.5 mV
MDC IDC LEAD IMPLANT DT: 20170525
MDC IDC LEAD IMPLANT DT: 20170525
MDC IDC LEAD IMPLANT DT: 20170525
MDC IDC LEAD LOCATION: 753859
MDC IDC MSMT LEADCHNL LV PACING THRESHOLD PULSEWIDTH: 0.5 ms
MDC IDC MSMT LEADCHNL RA PACING THRESHOLD AMPLITUDE: 0.75 V
MDC IDC MSMT LEADCHNL RA SENSING INTR AMPL: 3.1 mV
MDC IDC MSMT LEADCHNL RV PACING THRESHOLD PULSEWIDTH: 0.5 ms
MDC IDC PG SERIAL: 7357926
MDC IDC SESS DTM: 20180309154304
MDC IDC SET LEADCHNL LV PACING PULSEWIDTH: 0.5 ms
MDC IDC SET LEADCHNL RA PACING AMPLITUDE: 2 V
MDC IDC STAT BRADY AP VS PERCENT: 1 %
MDC IDC STAT BRADY AS VP PERCENT: 93 %
MDC IDC STAT BRADY AS VS PERCENT: 2.5 %

## 2016-12-01 NOTE — Telephone Encounter (Signed)
Spoke with pt and reminded pt of remote transmission that is due today. Pt verbalized understanding.   

## 2016-12-01 NOTE — Progress Notes (Signed)
Remote ICD transmission.   

## 2016-12-06 ENCOUNTER — Encounter: Payer: Self-pay | Admitting: Cardiology

## 2016-12-14 ENCOUNTER — Encounter (HOSPITAL_COMMUNITY): Payer: Self-pay

## 2016-12-14 ENCOUNTER — Encounter (HOSPITAL_COMMUNITY)
Admission: RE | Admit: 2016-12-14 | Discharge: 2016-12-14 | Disposition: A | Discharge status: Home / Self Care | Payer: BC Managed Care – PPO | Source: Ambulatory Visit | Attending: Interventional Cardiology | Admitting: Interventional Cardiology

## 2016-12-14 VITALS — Ht 67.75 in | Wt 236.8 lb

## 2016-12-14 DIAGNOSIS — I5022 Chronic systolic (congestive) heart failure: Secondary | ICD-10-CM

## 2016-12-14 DIAGNOSIS — I469 Cardiac arrest, cause unspecified: Secondary | ICD-10-CM | POA: Insufficient documentation

## 2016-12-14 DIAGNOSIS — I5042 Chronic combined systolic (congestive) and diastolic (congestive) heart failure: Secondary | ICD-10-CM | POA: Diagnosis not present

## 2016-12-14 DIAGNOSIS — I5023 Acute on chronic systolic (congestive) heart failure: Secondary | ICD-10-CM | POA: Diagnosis present

## 2016-12-14 NOTE — Progress Notes (Signed)
Cardiac Individual Treatment Plan  Patient Details  Name: Eric Denardo  MD MRN: [id number] Date of Birth: 03-Feb-1951 Referring Provider:     [place] from 12/14/2016 in [place]  Referring Provider  Daneen Schick MD      Initial Encounter Date:    CARDIAC REHAB PHASE II ORIENTATION from 12/14/2016 in [place]  Date  12/14/16  Referring Provider  Daneen Schick MD      Visit Diagnosis: Heart failure, chronic systolic ([place])  Patient's Home Medications on Admission:  Current Outpatient Prescriptions:  .  acetaminophen (TYLENOL) 500 MG tablet, Take 500 mg by mouth every 6 (six) hours as needed., Disp: , Rfl:  .  amiodarone (PACERONE) 200 MG tablet, Take 1 tablet (200 mg total) by mouth daily., Disp: 30 tablet, Rfl: 11 .  apixaban (ELIQUIS) 5 MG TABS tablet, Take 1 tablet (5 mg total) by mouth 2 (two) times daily., Disp: 60 tablet, Rfl: 11 .  carvedilol (COREG) 25 MG tablet, Take 1 tablet (25 mg total) by mouth 2 (two) times daily., Disp: 60 tablet, Rfl: 0 .  cholecalciferol (VITAMIN D) 1000 units tablet, Take 1,000 Units by mouth daily., Disp: , Rfl:  .  diclofenac sodium (VOLTAREN) 1 % GEL, Apply 2 g topically 4 (four) times daily., Disp: 1 Tube, Rfl: 1 .  docusate sodium (COLACE) 100 MG capsule, Take 100 mg by mouth daily as needed for mild constipation., Disp: , Rfl:  .  fluticasone (FLONASE) 50 MCG/ACT nasal spray, Place 2 sprays into the nose as needed for allergies. , Disp: , Rfl:  .  furosemide (LASIX) 40 MG tablet, Take 1 tablet (40 mg total) by mouth daily., Disp: 90 tablet, Rfl: 0 .  isosorbide-hydrALAZINE (BIDIL) 20-37.5 MG tablet, Take 0.5 tablets by mouth 3 (three) times daily., Disp: 90 tablet, Rfl: 0 .  levothyroxine (SYNTHROID, LEVOTHROID) 75 MCG tablet, Take 1 tablet (75 mcg total) by mouth daily before breakfast., Disp: 30 tablet, Rfl: 11 .  magnesium oxide (MAG-OX) 400 MG  tablet, Take 1 tablet (400 mg total) by mouth daily., Disp: 30 tablet, Rfl: 1 .  rosuvastatin (CRESTOR) 20 MG tablet, Take 1 tablet (20 mg total) by mouth daily., Disp: 90 tablet, Rfl: 3 .  vitamin B-12 (CYANOCOBALAMIN) 100 MCG tablet, Take 100 mcg by mouth daily., Disp: , Rfl:  .  oxyCODONE-acetaminophen (PERCOCET/ROXICET) 5-325 MG tablet, Take 1 tablet by mouth every 6 (six) hours as needed for moderate pain. (Patient not taking: Reported on 12/14/2016), Disp: 90 tablet, Rfl: 0  Past Medical History: Past Medical History:  Diagnosis Date  . CHF (congestive heart failure) ([place])   . Chronic kidney disease    kidney fx studies increased   . Chronic lower back pain   . Claustrophobia   . Dysrhythmia    "palpitations"  . Exertional dyspnea 01/2012  . Heart murmur   . Hypertension   . Hypothyroidism   . Migraine 02/13/12   "opthalmic"  . Varicose vein of leg    right    Tobacco Use: History  Smoking Status  . Never Smoker  Smokeless Tobacco  . Never Used    Labs: Recent Review Flowsheet Data    Labs for ITP Cardiac and Pulmonary Rehab Latest Ref Rng & Units 02/01/2016 02/01/2016 02/02/2016 02/03/2016 02/10/2016   Cholestrol 0 - 200 mg/dL - - - - -   LDLCALC 0 - 99 mg/dL - - - - -  HDL >39 mg/dL - - - - -   Trlycerides <150 mg/dL - - - - -   Hemoglobin A1c 4.8 - 5.6 % - - - - 7.1(H)   PHART 7.350 - 7.450 - - - 7.312(L) -   PCO2ART 35.0 - 45.0 mmHg - - - 37.3 -   HCO3 20.0 - 24.0 mEq/L - - - 18.9(L) -   TCO2 0 - 100 mmol/L 24 18 21 20  -   ACIDBASEDEF 0.0 - 2.0 mmol/L - - - 7.0(H) -   O2SAT % - - - 96.0 -      Capillary Blood Glucose: Lab Results  Component Value Date   GLUCAP 109 (H) 03/06/2016   GLUCAP 100 (H) 03/06/2016   GLUCAP 116 (H) 03/05/2016   GLUCAP 126 (H) 03/05/2016   GLUCAP 107 (H) 03/05/2016     Exercise Target Goals: Date: 12/14/16  Exercise Program Goal: Individual exercise prescription set with THRR, safety & activity barriers. Participant  demonstrates ability to understand and report RPE using BORG scale, to self-measure pulse accurately, and to acknowledge the importance of the exercise prescription.  Exercise Prescription Goal: Starting with aerobic activity 30 plus minutes a day, 3 days per week for initial exercise prescription. Provide home exercise prescription and guidelines that participant acknowledges understanding prior to discharge.  Activity Barriers & Risk Stratification:     Activity Barriers & Cardiac Risk Stratification - 12/14/16 1435      Activity Barriers & Cardiac Risk Stratification   Activity Barriers Deconditioning;Assistive Device;Back Problems;Other (comment)   Comments R knee and quad pain,    Cardiac Risk Stratification High      6 Minute Walk:     6 Minute Walk    Row Name 12/14/16 1557         6 Minute Walk   Phase Initial     Distance 1337 feet     Walk Time 6 minutes     # of Rest Breaks 0     MPH 2.53     METS 3.1     RPE 11     VO2 Peak 10.76     Symptoms No     Resting HR 69 bpm     Resting BP 158/84     Max Ex. HR 108 bpm     Max Ex. BP 164/88     2 Minute Post BP 140/62        Oxygen Initial Assessment:   Oxygen Re-Evaluation:   Oxygen Discharge (Final Oxygen Re-Evaluation):   Initial Exercise Prescription:     Initial Exercise Prescription - 12/14/16 1500      Date of Initial Exercise RX and Referring Provider   Date 12/14/16   Referring Provider Daneen Schick MD     Treadmill   MPH 2   Grade 0   Minutes 10   METs 2.53     Recumbant Bike   Level 1.5   Minutes 10   METs 2.44     NuStep   Level 1   Minutes 10   METs 2.1     Prescription Details   Frequency (times per week) 5   Duration Progress to 45 minutes of aerobic exercise without signs/symptoms of physical distress     Intensity   THRR 40-80% of Max Heartrate 62-124   Ratings of Perceived Exertion 11-13   Perceived Dyspnea 0-4     Progression   Progression Continue to  progress workloads to maintain intensity without signs/symptoms of physical  distress.     Resistance Training   Training Prescription Yes   Weight 2   Reps 10-15      Perform Capillary Blood Glucose checks as needed.  Exercise Prescription Changes:   Exercise Comments:   Exercise Goals and Review:     Exercise Goals    Row Name 12/14/16 1413             Exercise Goals   Increase Physical Activity Yes       Intervention Provide advice, education, support and counseling about physical activity/exercise needs.;Develop an individualized exercise prescription for aerobic and resistive training based on initial evaluation findings, risk stratification, comorbidities and participant's personal goals.       Expected Outcomes Achievement of increased cardiorespiratory fitness and enhanced flexibility, muscular endurance and strength shown through measurements of functional capacity and personal statement of participant.       Increase Strength and Stamina Yes       Intervention Provide advice, education, support and counseling about physical activity/exercise needs.;Develop an individualized exercise prescription for aerobic and resistive training based on initial evaluation findings, risk stratification, comorbidities and participant's personal goals.       Expected Outcomes Achievement of increased cardiorespiratory fitness and enhanced flexibility, muscular endurance and strength shown through measurements of functional capacity and personal statement of participant.          Exercise Goals Re-Evaluation :    Discharge Exercise Prescription (Final Exercise Prescription Changes):   Nutrition:  Target Goals: Understanding of nutrition guidelines, daily intake of sodium 1500mg , cholesterol 200mg , calories 30% from fat and 7% or less from saturated fats, daily to have 5 or more servings of fruits and vegetables.  Biometrics:     Pre Biometrics - 12/14/16 1604      Pre  Biometrics   Height 5' 7.75" (1.721 m)   Weight 236 lb 12.4 oz (107.4 kg)   Waist Circumference 47 inches   Hip Circumference 50 inches   Waist to Hip Ratio 0.94 %   BMI (Calculated) 36.3   Triceps Skinfold 18 mm   % Body Fat 34.8 %   Grip Strength 34.5 kg   Flexibility 11.5 in   Single Leg Stand 14.85 seconds       Nutrition Therapy Plan and Nutrition Goals:   Nutrition Discharge: Nutrition Scores:   Nutrition Goals Re-Evaluation:   Nutrition Goals Re-Evaluation:   Nutrition Goals Discharge (Final Nutrition Goals Re-Evaluation):   Psychosocial: Target Goals: Acknowledge presence or absence of significant depression and/or stress, maximize coping skills, provide positive support system. Participant is able to verbalize types and ability to use techniques and skills needed for reducing stress and depression.  Initial Review & Psychosocial Screening:     Initial Psych Review & Screening - 12/14/16 1736      Initial Review   Current issues with None Identified     Family Dynamics   Good Support System? Yes     Barriers   Psychosocial barriers to participate in program There are no identifiable barriers or psychosocial needs.      Quality of Life Scores:   PHQ-9: Recent Review Flowsheet Data    There is no flowsheet data to display.     Interpretation of Total Score  Total Score Depression Severity:  1-4 = Minimal depression, 5-9 = Mild depression, 10-14 = Moderate depression, 15-19 = Moderately severe depression, 20-27 = Severe depression   Psychosocial Evaluation and Intervention:   Psychosocial Re-Evaluation:   Psychosocial Discharge (Final Psychosocial  Re-Evaluation):   Vocational Rehabilitation: Provide vocational rehab assistance to qualifying candidates.   Vocational Rehab Evaluation & Intervention:   Education: Education Goals: Education classes will be provided on a weekly basis, covering required topics. Participant will state  understanding/return demonstration of topics presented.  Learning Barriers/Preferences:     Learning Barriers/Preferences - 12/14/16 1412      Learning Barriers/Preferences   Learning Barriers Sight  reading   Learning Preferences Skilled Demonstration;Written Material      Education Topics: Count Your Pulse:  -Group instruction provided by verbal instruction, demonstration, patient participation and written materials to support subject.  Instructors address importance of being able to find your pulse and how to count your pulse when at home without a heart monitor.  Patients get hands on experience counting their pulse with staff help and individually.   Heart Attack, Angina, and Risk Factor Modification:  -Group instruction provided by verbal instruction, video, and written materials to support subject.  Instructors address signs and symptoms of angina and heart attacks.    Also discuss risk factors for heart disease and how to make changes to improve heart health risk factors.   Functional Fitness:  -Group instruction provided by verbal instruction, demonstration, patient participation, and written materials to support subject.  Instructors address safety measures for doing things around the house.  Discuss how to get up and down off the floor, how to pick things up properly, how to safely get out of a chair without assistance, and balance training.   Meditation and Mindfulness:  -Group instruction provided by verbal instruction, patient participation, and written materials to support subject.  Instructor addresses importance of mindfulness and meditation practice to help reduce stress and improve awareness.  Instructor also leads participants through a meditation exercise.    Stretching for Flexibility and Mobility:  -Group instruction provided by verbal instruction, patient participation, and written materials to support subject.  Instructors lead participants through series of  stretches that are designed to increase flexibility thus improving mobility.  These stretches are additional exercise for major muscle groups that are typically performed during regular warm up and cool down.   Hands Only CPR Anytime:  -Group instruction provided by verbal instruction, video, patient participation and written materials to support subject.  Instructors co-teach with AHA video for hands only CPR.  Participants get hands on experience with mannequins.   Nutrition I class: Heart Healthy Eating:  -Group instruction provided by PowerPoint slides, verbal discussion, and written materials to support subject matter. The instructor gives an explanation and review of the Therapeutic Lifestyle Changes diet recommendations, which includes a discussion on lipid goals, dietary fat, sodium, fiber, plant stanol/sterol esters, sugar, and the components of a well-balanced, healthy diet.   Nutrition II class: Lifestyle Skills:  -Group instruction provided by PowerPoint slides, verbal discussion, and written materials to support subject matter. The instructor gives an explanation and review of label reading, grocery shopping for heart health, heart healthy recipe modifications, and ways to make healthier choices when eating out.   Diabetes Question & Answer:  -Group instruction provided by PowerPoint slides, verbal discussion, and written materials to support subject matter. The instructor gives an explanation and review of diabetes co-morbidities, pre- and post-prandial blood glucose goals, pre-exercise blood glucose goals, signs, symptoms, and treatment of hypoglycemia and hyperglycemia, and foot care basics.   Diabetes Blitz:  -Group instruction provided by PowerPoint slides, verbal discussion, and written materials to support subject matter. The instructor gives an explanation and review of the  physiology behind type 1 and type 2 diabetes, diabetes medications and rational behind using different  medications, pre- and post-prandial blood glucose recommendations and Hemoglobin A1c goals, diabetes diet, and exercise including blood glucose guidelines for exercising safely.    Portion Distortion:  -Group instruction provided by PowerPoint slides, verbal discussion, written materials, and food models to support subject matter. The instructor gives an explanation of serving size versus portion size, changes in portions sizes over the last 20 years, and what consists of a serving from each food group.   Stress Management:  -Group instruction provided by verbal instruction, video, and written materials to support subject matter.  Instructors review role of stress in heart disease and how to cope with stress positively.     Exercising on Your Own:  -Group instruction provided by verbal instruction, power point, and written materials to support subject.  Instructors discuss benefits of exercise, components of exercise, frequency and intensity of exercise, and end points for exercise.  Also discuss use of nitroglycerin and activating EMS.  Review options of places to exercise outside of rehab.  Review guidelines for sex with heart disease.   Cardiac Drugs I:  -Group instruction provided by verbal instruction and written materials to support subject.  Instructor reviews cardiac drug classes: antiplatelets, anticoagulants, beta blockers, and statins.  Instructor discusses reasons, side effects, and lifestyle considerations for each drug class.   Cardiac Drugs II:  -Group instruction provided by verbal instruction and written materials to support subject.  Instructor reviews cardiac drug classes: angiotensin converting enzyme inhibitors (ACE-I), angiotensin II receptor blockers (ARBs), nitrates, and calcium channel blockers.  Instructor discusses reasons, side effects, and lifestyle considerations for each drug class.   Anatomy and Physiology of the Circulatory System:  -Group instruction provided  by verbal instruction, video, and written materials to support subject.  Reviews functional anatomy of heart, how it relates to various diagnoses, and what role the heart plays in the overall system.   Knowledge Questionnaire Score:   Core Components/Risk Factors/Patient Goals at Admission:     Personal Goals and Risk Factors at Admission - 12/14/16 1413      Core Components/Risk Factors/Patient Goals on Admission    Weight Management Yes;Weight Maintenance;Weight Loss   Intervention Weight Management: Develop a combined nutrition and exercise program designed to reach desired caloric intake, while maintaining appropriate intake of nutrient and fiber, sodium and fats, and appropriate energy expenditure required for the weight goal.;Weight Management: Provide education and appropriate resources to help participant work on and attain dietary goals.;Weight Management/Obesity: Establish reasonable short term and long term weight goals.   Expected Outcomes Short Term: Continue to assess and modify interventions until short term weight is achieved;Long Term: Adherence to nutrition and physical activity/exercise program aimed toward attainment of established weight goal;Weight Maintenance: Understanding of the daily nutrition guidelines, which includes 25-35% calories from fat, 7% or less cal from saturated fats, less than 200mg  cholesterol, less than 1.5gm of sodium, & 5 or more servings of fruits and vegetables daily;Weight Loss: Understanding of general recommendations for a balanced deficit meal plan, which promotes 1-2 lb weight loss per week and includes a negative energy balance of [phone] kcal/d;Understanding recommendations for meals to include 15-35% energy as protein, 25-35% energy from fat, 35-60% energy from carbohydrates, less than 200mg  of dietary cholesterol, 20-35 gm of total fiber daily;Understanding of distribution of calorie intake throughout the day with the consumption of 4-5  meals/snacks   Heart Failure Yes   Intervention Provide a combined  exercise and nutrition program that is supplemented with education, support and counseling about heart failure. Directed toward relieving symptoms such as shortness of breath, decreased exercise tolerance, and extremity edema.   Expected Outcomes Improve functional capacity of life;Short term: Attendance in program 2-3 days a week with increased exercise capacity. Reported lower sodium intake. Reported increased fruit and vegetable intake. Reports medication compliance.;Short term: Daily weights obtained and reported for increase. Utilizing diuretic protocols set by physician.;Long term: Adoption of self-care skills and reduction of barriers for early signs and symptoms recognition and intervention leading to self-care maintenance.   Hypertension Yes   Intervention Provide education on lifestyle modifcations including regular physical activity/exercise, weight management, moderate sodium restriction and increased consumption of fresh fruit, vegetables, and low fat dairy, alcohol moderation, and smoking cessation.;Monitor prescription use compliance.   Expected Outcomes Short Term: Continued assessment and intervention until BP is < 140/20mm HG in hypertensive participants. < 130/50mm HG in hypertensive participants with diabetes, heart failure or chronic kidney disease.;Long Term: Maintenance of blood pressure at goal levels.   Lipids Yes   Intervention Provide education and support for participant on nutrition & aerobic/resistive exercise along with prescribed medications to achieve LDL 70mg , HDL >40mg .   Expected Outcomes Short Term: Participant states understanding of desired cholesterol values and is compliant with medications prescribed. Participant is following exercise prescription and nutrition guidelines.;Long Term: Cholesterol controlled with medications as prescribed, with individualized exercise RX and with personalized  nutrition plan. Value goals: LDL < 70mg , HDL > 40 mg.   Stress Yes   Intervention Offer individual and/or small group education and counseling on adjustment to heart disease, stress management and health-related lifestyle change. Teach and support self-help strategies.;Refer participants experiencing significant psychosocial distress to appropriate mental health specialists for further evaluation and treatment. When possible, include family members and significant others in education/counseling sessions.   Expected Outcomes Short Term: Participant demonstrates changes in health-related behavior, relaxation and other stress management skills, ability to obtain effective social support, and compliance with psychotropic medications if prescribed.;Long Term: Emotional wellbeing is indicated by absence of clinically significant psychosocial distress or social isolation.   Personal Goal Other Yes   Personal Goal Be able to walk up and down the hill, negogiate stairs without difficulty. Lose weight in the belly region goal weight ~200lbs   Intervention Provide exercise programming to assist with improve functional mobility and cardiorespiratory fitness.    Expected Outcomes Pt will lose weight, gain muscle mass and be able to negogiate stairs and walk up/downhill.      Core Components/Risk Factors/Patient Goals Review:    Core Components/Risk Factors/Patient Goals at Discharge (Final Review):    ITP Comments:     ITP Comments    Row Name 12/14/16 1410           ITP Comments Dr. Fransico Him, Medical Director          Comments:  Pt in today for cardiac rehab phase II orientation appt. From 1:30 to 3:00 pm.  As a part of the orientation appt pt completed warm up stretches and 6 minute walk test.  Pt used his cane and tolerated well with no complaints.  Monitor showed AV paced with rare intrinsic beats.  Pt denied any cp or sob.  Pt with slight elevation in bp.  Pt had not taken his second dose  of bidil or his lasix due to work schedule and coming to orientation.  Pt advised he will need to take his medication before coming  to 2:45 exercise class.  Pt to bring completed patient pre assessment tools on Monday for his first day of exercise.  Brief psychosocial assessment reveals no barriers to participating in cardiac rehab.  Pt has supportive wife.  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  Pt is excited to begin full exercise on next week. Cherre Huger, BSN Cardiac and Training and development officer

## 2016-12-18 ENCOUNTER — Encounter (HOSPITAL_COMMUNITY)
Admission: RE | Admit: 2016-12-18 | Discharge: 2016-12-18 | Disposition: A | Discharge status: Home / Self Care | Payer: BC Managed Care – PPO | Source: Ambulatory Visit | Attending: Interventional Cardiology | Admitting: Interventional Cardiology

## 2016-12-18 DIAGNOSIS — I5042 Chronic combined systolic (congestive) and diastolic (congestive) heart failure: Secondary | ICD-10-CM | POA: Diagnosis not present

## 2016-12-18 DIAGNOSIS — I5022 Chronic systolic (congestive) heart failure: Secondary | ICD-10-CM

## 2016-12-18 NOTE — Progress Notes (Signed)
Daily Session Note  Patient Details  Name: Eric Mazariego  MD MRN: [id number] Date of Birth: [birth year] Referring Provider:     CARDIAC REHAB PHASE II ORIENTATION from 12/14/2016 in [place]  Referring Provider  Daneen Schick MD      Encounter Date: 12/18/2016  Check In:     Session Check In - 12/18/16 1548      Check-In   Location [facility]   Staff Present Cleda Mccreedy, MS, Exercise Physiologist;Joann Rion, RN, Marga Melnick, RN, BSN   Supervising physician immediately available to respond to emergencies [facility] immediately available   Physician(s) Dr. Tana Coast    Medication changes reported     No   Fall or balance concerns reported    No   Tobacco Cessation No Change   Warm-up and Cool-down Performed as group-led instruction   Resistance Training Performed Yes   VAD Patient? No     Pain Assessment   Currently in Pain? No/denies      Capillary Blood Glucose: No results found for this or any previous visit (from the past 24 hour(s)).    History  Smoking Status  . Never Smoker  Smokeless Tobacco  . Never Used    Goals Met:  Exercise tolerated well  Goals Unmet:  Not Applicable  Comments: Dr Venetia Maxon started cardiac rehab today.  Pt tolerated light exercise without difficulty. VSS, telemetry-AV paced, asymptomatic.  Medication list reconciled. Pt denies barriers to medicaiton compliance.  PSYCHOSOCIAL ASSESSMENT:  PHQ-0. Pt exhibits positive coping skills, hopeful outlook with supportive family. No psychosocial needs identified at this time, no psychosocial interventions necessary.    Pt enjoys watching and attending sporting events. Playing tennis and pool.   Pt oriented to exercise equipment and routine.   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 Dr Gertie Exon gait was good with the walking cane. Dr Venetia Maxon walked on the track without difficulty. Dr Venetia Maxon did not take his  Am dose of Bidil. Dr Venetia Maxon says he sometimes feels dizzy when he takes his morning dose. Vital signs were stable today. Will continue to monitor BP.  Understanding verbalized. Bishoy Cupp, RN,BSN 12/18/2016 4:13 PM   Dr. Fransico Him is Medical Director for Cardiac Rehab at [facility].

## 2016-12-20 ENCOUNTER — Telehealth (HOSPITAL_COMMUNITY): Payer: Self-pay | Admitting: Internal Medicine

## 2016-12-20 ENCOUNTER — Encounter (HOSPITAL_COMMUNITY): Payer: BC Managed Care – PPO

## 2016-12-20 NOTE — Progress Notes (Signed)
Cardiac Individual Treatment Plan  Patient Details  Name: Eric Brisk  MD MRN: [id number] Date of Birth: October 11, 1950 Referring Provider:     [facility] from 12/14/2016 in [place]  Referring Provider  Daneen Schick MD      Initial Encounter Date:    CARDIAC REHAB PHASE II ORIENTATION from 12/14/2016 in [place]  Date  12/14/16  Referring Provider  Daneen Schick MD      Visit Diagnosis: Heart failure, chronic systolic ([place])  Patient's Home Medications on Admission:  Current Outpatient Prescriptions:  .  acetaminophen (TYLENOL) 500 MG tablet, Take 500 mg by mouth every 6 (six) hours as needed., Disp: , Rfl:  .  amiodarone (PACERONE) 200 MG tablet, Take 1 tablet (200 mg total) by mouth daily., Disp: 30 tablet, Rfl: 11 .  apixaban (ELIQUIS) 5 MG TABS tablet, Take 1 tablet (5 mg total) by mouth 2 (two) times daily., Disp: 60 tablet, Rfl: 11 .  carvedilol (COREG) 25 MG tablet, Take 1 tablet (25 mg total) by mouth 2 (two) times daily., Disp: 60 tablet, Rfl: 0 .  cholecalciferol (VITAMIN D) 1000 units tablet, Take 1,000 Units by mouth daily., Disp: , Rfl:  .  diclofenac sodium (VOLTAREN) 1 % GEL, Apply 2 g topically 4 (four) times daily., Disp: 1 Tube, Rfl: 1 .  docusate sodium (COLACE) 100 MG capsule, Take 100 mg by mouth daily as needed for mild constipation., Disp: , Rfl:  .  fluticasone (FLONASE) 50 MCG/ACT nasal spray, Place 2 sprays into the nose as needed for allergies. , Disp: , Rfl:  .  furosemide (LASIX) 40 MG tablet, Take 1 tablet (40 mg total) by mouth daily., Disp: 90 tablet, Rfl: 0 .  isosorbide-hydrALAZINE (BIDIL) 20-37.5 MG tablet, Take 0.5 tablets by mouth 3 (three) times daily., Disp: 90 tablet, Rfl: 0 .  levothyroxine (SYNTHROID, LEVOTHROID) 75 MCG tablet, Take 1 tablet (75 mcg total) by mouth daily before breakfast., Disp: 30 tablet, Rfl: 11 .  magnesium oxide (MAG-OX) 400 MG  tablet, Take 1 tablet (400 mg total) by mouth daily., Disp: 30 tablet, Rfl: 1 .  rosuvastatin (CRESTOR) 20 MG tablet, Take 1 tablet (20 mg total) by mouth daily., Disp: 90 tablet, Rfl: 3 .  vitamin B-12 (CYANOCOBALAMIN) 100 MCG tablet, Take 100 mcg by mouth daily., Disp: , Rfl:  .  oxyCODONE-acetaminophen (PERCOCET/ROXICET) 5-325 MG tablet, Take 1 tablet by mouth every 6 (six) hours as needed for moderate pain. (Patient not taking: Reported on 12/14/2016), Disp: 90 tablet, Rfl: 0  Past Medical History: Past Medical History:  Diagnosis Date  . CHF (congestive heart failure) ([place])   . Chronic kidney disease    kidney fx studies increased   . Chronic lower back pain   . Claustrophobia   . Dysrhythmia    "palpitations"  . Exertional dyspnea 01/2012  . Heart murmur   . Hypertension   . Hypothyroidism   . Migraine 02/13/12   "opthalmic"  . Varicose vein of leg    right    Tobacco Use: History  Smoking Status  . Never Smoker  Smokeless Tobacco  . Never Used    Labs: Recent Review Flowsheet Data    Labs for ITP Cardiac and Pulmonary Rehab Latest Ref Rng & Units 02/01/2016 02/01/2016 02/02/2016 02/03/2016 02/10/2016   Cholestrol 0 - 200 mg/dL - - - - -   LDLCALC 0 - 99 mg/dL - - - - -  HDL >39 mg/dL - - - - -   Trlycerides <150 mg/dL - - - - -   Hemoglobin A1c 4.8 - 5.6 % - - - - 7.1(H)   PHART 7.350 - 7.450 - - - 7.312(L) -   PCO2ART 35.0 - 45.0 mmHg - - - 37.3 -   HCO3 20.0 - 24.0 mEq/L - - - 18.9(L) -   TCO2 0 - 100 mmol/L 24 18 21 20  -   ACIDBASEDEF 0.0 - 2.0 mmol/L - - - 7.0(H) -   O2SAT % - - - 96.0 -      Capillary Blood Glucose: Lab Results  Component Value Date   GLUCAP 109 (H) 03/06/2016   GLUCAP 100 (H) 03/06/2016   GLUCAP 116 (H) 03/05/2016   GLUCAP 126 (H) 03/05/2016   GLUCAP 107 (H) 03/05/2016     Exercise Target Goals:    Exercise Program Goal: Individual exercise prescription set with THRR, safety & activity barriers. Participant demonstrates ability to  understand and report RPE using BORG scale, to self-measure pulse accurately, and to acknowledge the importance of the exercise prescription.  Exercise Prescription Goal: Starting with aerobic activity 30 plus minutes a day, 3 days per week for initial exercise prescription. Provide home exercise prescription and guidelines that participant acknowledges understanding prior to discharge.  Activity Barriers & Risk Stratification:     Activity Barriers & Cardiac Risk Stratification - 12/14/16 1435      Activity Barriers & Cardiac Risk Stratification   Activity Barriers Deconditioning;Assistive Device;Back Problems;Other (comment)   Comments R knee and quad pain,    Cardiac Risk Stratification High      6 Minute Walk:     6 Minute Walk    Row Name 12/14/16 1557         6 Minute Walk   Phase Initial     Distance 1337 feet     Walk Time 6 minutes     # of Rest Breaks 0     MPH 2.53     METS 3.1     RPE 11     VO2 Peak 10.76     Symptoms No     Resting HR 69 bpm     Resting BP 158/84     Max Ex. HR 108 bpm     Max Ex. BP 164/88     2 Minute Post BP 140/62        Oxygen Initial Assessment:   Oxygen Re-Evaluation:   Oxygen Discharge (Final Oxygen Re-Evaluation):   Initial Exercise Prescription:     Initial Exercise Prescription - 12/14/16 1500      Date of Initial Exercise RX and Referring Provider   Date 12/14/16   Referring Provider Daneen Schick MD     Treadmill   MPH 2   Grade 0   Minutes 10   METs 2.53     Recumbant Bike   Level 1.5   Minutes 10   METs 2.44     NuStep   Level 1   Minutes 10   METs 2.1     Prescription Details   Frequency (times per week) 5   Duration Progress to 45 minutes of aerobic exercise without signs/symptoms of physical distress     Intensity   THRR 40-80% of Max Heartrate 62-124   Ratings of Perceived Exertion 11-13   Perceived Dyspnea 0-4     Progression   Progression Continue to progress workloads to maintain  intensity without signs/symptoms of physical  distress.     Resistance Training   Training Prescription Yes   Weight 2   Reps 10-15      Perform Capillary Blood Glucose checks as needed.  Exercise Prescription Changes:   Exercise Comments:      Exercise Comments    Row Name 12/18/16 1635           Exercise Comments Off to a great start with exercise!          Exercise Goals and Review:      Exercise Goals    Row Name 12/14/16 1413             Exercise Goals   Increase Physical Activity Yes       Intervention Provide advice, education, support and counseling about physical activity/exercise needs.;Develop an individualized exercise prescription for aerobic and resistive training based on initial evaluation findings, risk stratification, comorbidities and participant's personal goals.       Expected Outcomes Achievement of increased cardiorespiratory fitness and enhanced flexibility, muscular endurance and strength shown through measurements of functional capacity and personal statement of participant.       Increase Strength and Stamina Yes       Intervention Provide advice, education, support and counseling about physical activity/exercise needs.;Develop an individualized exercise prescription for aerobic and resistive training based on initial evaluation findings, risk stratification, comorbidities and participant's personal goals.       Expected Outcomes Achievement of increased cardiorespiratory fitness and enhanced flexibility, muscular endurance and strength shown through measurements of functional capacity and personal statement of participant.          Exercise Goals Re-Evaluation :    Discharge Exercise Prescription (Final Exercise Prescription Changes):   Nutrition:  Target Goals: Understanding of nutrition guidelines, daily intake of sodium 1500mg , cholesterol 200mg , calories 30% from fat and 7% or less from saturated fats, daily to have 5 or more  servings of fruits and vegetables.  Biometrics:     Pre Biometrics - 12/14/16 1604      Pre Biometrics   Height 5' 7.75" (1.721 m)   Weight 236 lb 12.4 oz (107.4 kg)   Waist Circumference 47 inches   Hip Circumference 50 inches   Waist to Hip Ratio 0.94 %   BMI (Calculated) 36.3   Triceps Skinfold 18 mm   % Body Fat 34.8 %   Grip Strength 34.5 kg   Flexibility 11.5 in   Single Leg Stand 14.85 seconds       Nutrition Therapy Plan and Nutrition Goals:     Nutrition Therapy & Goals - 12/15/16 0802      Nutrition Therapy   Diet Carb Modified, Therapeutic Lifestyle Changes     Personal Nutrition Goals   Nutrition Goal Wt loss of 1-2 lb/week to a wt loss goal of 6-24 lb at graduation from Cardiac Rehab.      Intervention Plan   Intervention Prescribe, educate and counsel regarding individualized specific dietary modifications aiming towards targeted core components such as weight, hypertension, lipid management, diabetes, heart failure and other comorbidities.   Expected Outcomes Short Term Goal: Understand basic principles of dietary content, such as calories, fat, sodium, cholesterol and nutrients.;Long Term Goal: Adherence to prescribed nutrition plan.      Nutrition Discharge: Nutrition Scores:   Nutrition Goals Re-Evaluation:   Nutrition Goals Re-Evaluation:   Nutrition Goals Discharge (Final Nutrition Goals Re-Evaluation):   Psychosocial: Target Goals: Acknowledge presence or absence of significant depression and/or stress, maximize coping skills, provide positive  support system. Participant is able to verbalize types and ability to use techniques and skills needed for reducing stress and depression.  Initial Review & Psychosocial Screening:     Initial Psych Review & Screening - 12/18/16 1635      Screening Interventions   Interventions Encouraged to exercise      Quality of Life Scores:   PHQ-9: Recent Review Flowsheet Data    Depression screen  [facility] 2/9 12/18/2016   Decreased Interest 0   Down, Depressed, Hopeless 0   PHQ - 2 Score 0     Interpretation of Total Score  Total Score Depression Severity:  1-4 = Minimal depression, 5-9 = Mild depression, 10-14 = Moderate depression, 15-19 = Moderately severe depression, 20-27 = Severe depression   Psychosocial Evaluation and Intervention:   Psychosocial Re-Evaluation:     Psychosocial Re-Evaluation    [place] Name 12/20/16 1801             Psychosocial Re-Evaluation   Current issues with None Identified       Interventions Encouraged to attend Cardiac Rehabilitation for the exercise       Continue Psychosocial Services  No Follow up required          Psychosocial Discharge (Final Psychosocial Re-Evaluation):     Psychosocial Re-Evaluation - 12/20/16 1801      Psychosocial Re-Evaluation   Current issues with None Identified   Interventions Encouraged to attend Cardiac Rehabilitation for the exercise   Continue Psychosocial Services  No Follow up required      Vocational Rehabilitation: Provide vocational rehab assistance to qualifying candidates.   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      Education: Education Goals: Education classes will be provided on a weekly basis, covering required topics. Participant will state understanding/return demonstration of topics presented.  Learning Barriers/Preferences:     Learning Barriers/Preferences - 12/14/16 1412      Learning Barriers/Preferences   Learning Barriers Sight  reading   Learning Preferences Skilled Demonstration;Written Material      Education Topics: Count Your Pulse:  -Group instruction provided by verbal instruction, demonstration, patient  participation and written materials to support subject.  Instructors address importance of being able to find your pulse and how to count your pulse when at home without a heart monitor.  Patients get hands on experience counting their pulse with staff help and individually.   Heart Attack, Angina, and Risk Factor Modification:  -Group instruction provided by verbal instruction, video, and written materials to support subject.  Instructors address signs and symptoms of angina and heart attacks.    Also discuss risk factors for heart disease and how to make changes to improve heart health risk factors.   Functional Fitness:  -Group instruction provided by verbal instruction, demonstration, patient participation, and written materials to support subject.  Instructors address safety measures for doing things around the house.  Discuss how to get up and down off the floor, how to pick things up properly, how to safely get out of a chair without assistance, and balance training.   Meditation and Mindfulness:  -Group instruction provided by verbal instruction, patient participation, and written materials to support subject.  Instructor addresses importance of mindfulness and meditation practice to  help reduce stress and improve awareness.  Instructor also leads participants through a meditation exercise.    Stretching for Flexibility and Mobility:  -Group instruction provided by verbal instruction, patient participation, and written materials to support subject.  Instructors lead participants through series of stretches that are designed to increase flexibility thus improving mobility.  These stretches are additional exercise for major muscle groups that are typically performed during regular warm up and cool down.   Hands Only CPR Anytime:  -Group instruction provided by verbal instruction, video, patient participation and written materials to support subject.  Instructors co-teach with AHA video for  hands only CPR.  Participants get hands on experience with mannequins.   Nutrition I class: Heart Healthy Eating:  -Group instruction provided by PowerPoint slides, verbal discussion, and written materials to support subject matter. The instructor gives an explanation and review of the Therapeutic Lifestyle Changes diet recommendations, which includes a discussion on lipid goals, dietary fat, sodium, fiber, plant stanol/sterol esters, sugar, and the components of a well-balanced, healthy diet.   Nutrition II class: Lifestyle Skills:  -Group instruction provided by PowerPoint slides, verbal discussion, and written materials to support subject matter. The instructor gives an explanation and review of label reading, grocery shopping for heart health, heart healthy recipe modifications, and ways to make healthier choices when eating out.   Diabetes Question & Answer:  -Group instruction provided by PowerPoint slides, verbal discussion, and written materials to support subject matter. The instructor gives an explanation and review of diabetes co-morbidities, pre- and post-prandial blood glucose goals, pre-exercise blood glucose goals, signs, symptoms, and treatment of hypoglycemia and hyperglycemia, and foot care basics.   Diabetes Blitz:  -Group instruction provided by PowerPoint slides, verbal discussion, and written materials to support subject matter. The instructor gives an explanation and review of the physiology behind type 1 and type 2 diabetes, diabetes medications and rational behind using different medications, pre- and post-prandial blood glucose recommendations and Hemoglobin A1c goals, diabetes diet, and exercise including blood glucose guidelines for exercising safely.    Portion Distortion:  -Group instruction provided by PowerPoint slides, verbal discussion, written materials, and food models to support subject matter. The instructor gives an explanation of serving size versus portion  size, changes in portions sizes over the last 20 years, and what consists of a serving from each food group.   Stress Management:  -Group instruction provided by verbal instruction, video, and written materials to support subject matter.  Instructors review role of stress in heart disease and how to cope with stress positively.     Exercising on Your Own:  -Group instruction provided by verbal instruction, power point, and written materials to support subject.  Instructors discuss benefits of exercise, components of exercise, frequency and intensity of exercise, and end points for exercise.  Also discuss use of nitroglycerin and activating EMS.  Review options of places to exercise outside of rehab.  Review guidelines for sex with heart disease.   Cardiac Drugs I:  -Group instruction provided by verbal instruction and written materials to support subject.  Instructor reviews cardiac drug classes: antiplatelets, anticoagulants, beta blockers, and statins.  Instructor discusses reasons, side effects, and lifestyle considerations for each drug class.   Cardiac Drugs II:  -Group instruction provided by verbal instruction and written materials to support subject.  Instructor reviews cardiac drug classes: angiotensin converting enzyme inhibitors (ACE-I), angiotensin II receptor blockers (ARBs), nitrates, and calcium channel blockers.  Instructor discusses reasons, side effects, and lifestyle considerations for each  drug class.   Anatomy and Physiology of the Circulatory System:  -Group instruction provided by verbal instruction, video, and written materials to support subject.  Reviews functional anatomy of heart, how it relates to various diagnoses, and what role the heart plays in the overall system.   Knowledge Questionnaire Score:   Core Components/Risk Factors/Patient Goals at Admission:     Personal Goals and Risk Factors at Admission - 12/14/16 1413      Core Components/Risk  Factors/Patient Goals on Admission    Weight Management Yes;Weight Loss   Intervention Weight Management: Develop a combined nutrition and exercise program designed to reach desired caloric intake, while maintaining appropriate intake of nutrient and fiber, sodium and fats, and appropriate energy expenditure required for the weight goal.;Weight Management: Provide education and appropriate resources to help participant work on and attain dietary goals.;Weight Management/Obesity: Establish reasonable short term and long term weight goals.   Expected Outcomes Short Term: Continue to assess and modify interventions until short term weight is achieved;Long Term: Adherence to nutrition and physical activity/exercise program aimed toward attainment of established weight goal;Weight Maintenance: Understanding of the daily nutrition guidelines, which includes 25-35% calories from fat, 7% or less cal from saturated fats, less than 200mg  cholesterol, less than 1.5gm of sodium, & 5 or more servings of fruits and vegetables daily;Weight Loss: Understanding of general recommendations for a balanced deficit meal plan, which promotes 1-2 lb weight loss per week and includes a negative energy balance of [id number] kcal/d;Understanding recommendations for meals to include 15-35% energy as protein, 25-35% energy from fat, 35-60% energy from carbohydrates, less than 200mg  of dietary cholesterol, 20-35 gm of total fiber daily;Understanding of distribution of calorie intake throughout the day with the consumption of 4-5 meals/snacks   Heart Failure Yes   Intervention Provide a combined exercise and nutrition program that is supplemented with education, support and counseling about heart failure. Directed toward relieving symptoms such as shortness of breath, decreased exercise tolerance, and extremity edema.   Expected Outcomes Improve functional capacity of life;Short term: Attendance in program 2-3 days a week with increased  exercise capacity. Reported lower sodium intake. Reported increased fruit and vegetable intake. Reports medication compliance.;Short term: Daily weights obtained and reported for increase. Utilizing diuretic protocols set by physician.;Long term: Adoption of self-care skills and reduction of barriers for early signs and symptoms recognition and intervention leading to self-care maintenance.   Hypertension Yes   Intervention Provide education on lifestyle modifcations including regular physical activity/exercise, weight management, moderate sodium restriction and increased consumption of fresh fruit, vegetables, and low fat dairy, alcohol moderation, and smoking cessation.;Monitor prescription use compliance.   Expected Outcomes Short Term: Continued assessment and intervention until BP is < 140/1mm HG in hypertensive participants. < 130/52mm HG in hypertensive participants with diabetes, heart failure or chronic kidney disease.;Long Term: Maintenance of blood pressure at goal levels.   Lipids Yes   Intervention Provide education and support for participant on nutrition & aerobic/resistive exercise along with prescribed medications to achieve LDL 70mg , HDL >40mg .   Expected Outcomes Short Term: Participant states understanding of desired cholesterol values and is compliant with medications prescribed. Participant is following exercise prescription and nutrition guidelines.;Long Term: Cholesterol controlled with medications as prescribed, with individualized exercise RX and with personalized nutrition plan. Value goals: LDL < 70mg , HDL > 40 mg.   Stress Yes   Intervention Offer individual and/or small group education and counseling on adjustment to heart disease, stress management and health-related lifestyle change. Teach and support  self-help strategies.;Refer participants experiencing significant psychosocial distress to appropriate mental health specialists for further evaluation and treatment. When  possible, include family members and significant others in education/counseling sessions.   Expected Outcomes Short Term: Participant demonstrates changes in health-related behavior, relaxation and other stress management skills, ability to obtain effective social support, and compliance with psychotropic medications if prescribed.;Long Term: Emotional wellbeing is indicated by absence of clinically significant psychosocial distress or social isolation.   Personal Goal Other Yes   Personal Goal Be able to walk up and down the hill, negogiate stairs without difficulty. Lose weight in the belly region goal weight ~200lbs   Intervention Provide exercise programming to assist with improve functional mobility and cardiorespiratory fitness.    Expected Outcomes Pt will lose weight, gain muscle mass and be able to negogiate stairs and walk up/downhill.      Core Components/Risk Factors/Patient Goals Review:    Core Components/Risk Factors/Patient Goals at Discharge (Final Review):    ITP Comments:     ITP Comments    Row Name 12/14/16 1410           ITP Comments Dr. 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

## 2016-12-22 ENCOUNTER — Encounter (HOSPITAL_COMMUNITY)
Admission: RE | Admit: 2016-12-22 | Discharge: 2016-12-22 | Disposition: A | Discharge status: Home / Self Care | Payer: BC Managed Care – PPO | Source: Ambulatory Visit | Attending: Interventional Cardiology | Admitting: Interventional Cardiology

## 2016-12-22 DIAGNOSIS — I5022 Chronic systolic (congestive) heart failure: Secondary | ICD-10-CM

## 2016-12-22 DIAGNOSIS — I5042 Chronic combined systolic (congestive) and diastolic (congestive) heart failure: Secondary | ICD-10-CM | POA: Diagnosis not present

## 2016-12-25 ENCOUNTER — Encounter (HOSPITAL_COMMUNITY)
Admission: RE | Admit: 2016-12-25 | Discharge: 2016-12-25 | Disposition: A | Discharge status: Home / Self Care | Payer: BC Managed Care – PPO | Source: Ambulatory Visit | Attending: Interventional Cardiology | Admitting: Interventional Cardiology

## 2016-12-25 DIAGNOSIS — I5022 Chronic systolic (congestive) heart failure: Secondary | ICD-10-CM

## 2016-12-25 DIAGNOSIS — I5023 Acute on chronic systolic (congestive) heart failure: Secondary | ICD-10-CM | POA: Diagnosis present

## 2016-12-25 DIAGNOSIS — I469 Cardiac arrest, cause unspecified: Secondary | ICD-10-CM | POA: Insufficient documentation

## 2016-12-25 DIAGNOSIS — I5042 Chronic combined systolic (congestive) and diastolic (congestive) heart failure: Secondary | ICD-10-CM | POA: Diagnosis not present

## 2016-12-25 NOTE — Progress Notes (Signed)
Eric Corning  MD 66 y.o. male Nutrition Note Spoke with pt. Nutrition Plan and Nutrition Survey goals reviewed with pt. Pt is following Step 2 of the Therapeutic Lifestyle Changes diet. Pt wants to lose wt. Pt has been trying to lose wt by decreasing portion sizes consumed. Pt is a diet-controlled diabetic. Pt reports his last A1c "was 6 something," which indicates blood glucose well-controlled. Pt with dx of CHF. Per discussion, pt watches his sodium intake and is aware of foods high in sodium. Pt expressed understanding of the information reviewed. Pt aware of nutrition education classes offered.  Lab Results  Component Value Date   HGBA1C 7.1 (H) 02/10/2016   Wt Readings from Last 3 Encounters:  12/14/16 236 lb 12.4 oz (107.4 kg)  08/07/16 221 lb (100.2 kg)  05/25/16 221 lb (100.2 kg)    Nutrition Diagnosis ? Food-and nutrition-related knowledge deficit related to lack of exposure to information as related to diagnosis of: ? CVD ? DM ? Obesity related to excessive energy intake as evidenced by a BMI of 36.3  Nutrition Intervention ? Pt's individual nutrition plan reviewed with pt. ? Benefits of adopting Therapeutic Lifestyle Changes discussed when Medficts reviewed. ? Pt to attend the Portion Distortion class ? Pt to attend the Diabetes Q & A class  ? Pt given handouts for: ? Nutrition I class ? Nutrition II class ? Diabetes Blitz Class  ? Continue client-centered nutrition education by RD, as part of interdisciplinary care. Goal(s) ? Pt to identify food quantities necessary to achieve weight loss of 6-24 lb (2.7-10.9 kg) at graduation from cardiac rehab.  Monitor and Evaluate progress toward nutrition goal with team. Derek Mound, M.Ed, RD, LDN, CDE 12/25/2016 4:11 PM

## 2016-12-27 ENCOUNTER — Encounter (HOSPITAL_COMMUNITY)
Admission: RE | Admit: 2016-12-27 | Discharge: 2016-12-27 | Disposition: A | Discharge status: Home / Self Care | Payer: BC Managed Care – PPO | Source: Ambulatory Visit | Attending: Interventional Cardiology | Admitting: Interventional Cardiology

## 2016-12-27 ENCOUNTER — Other Ambulatory Visit: Payer: BC Managed Care – PPO | Admitting: *Deleted

## 2016-12-27 DIAGNOSIS — I5042 Chronic combined systolic (congestive) and diastolic (congestive) heart failure: Secondary | ICD-10-CM | POA: Diagnosis not present

## 2016-12-27 DIAGNOSIS — I10 Essential (primary) hypertension: Secondary | ICD-10-CM

## 2016-12-27 DIAGNOSIS — I5022 Chronic systolic (congestive) heart failure: Secondary | ICD-10-CM

## 2016-12-27 DIAGNOSIS — E7800 Pure hypercholesterolemia, unspecified: Secondary | ICD-10-CM

## 2016-12-27 LAB — HEPATIC FUNCTION PANEL
ALK PHOS: 57 IU/L (ref 39–117)
ALT: 18 IU/L (ref 0–44)
AST: 29 IU/L (ref 0–40)
Albumin: 4 g/dL (ref 3.6–4.8)
Bilirubin Total: 0.4 mg/dL (ref 0.0–1.2)
Bilirubin, Direct: 0.15 mg/dL (ref 0.00–0.40)
Total Protein: 7.3 g/dL (ref 6.0–8.5)

## 2016-12-27 LAB — LIPID PANEL
Chol/HDL Ratio: 2.5 ratio (ref 0.0–5.0)
Cholesterol, Total: 163 mg/dL (ref 100–199)
HDL: 64 mg/dL (ref >39)
LDL CALC: 85 mg/dL (ref 0–99)
Triglycerides: 72 mg/dL (ref 0–149)
VLDL CHOLESTEROL CAL: 14 mg/dL (ref 5–40)

## 2016-12-27 NOTE — Progress Notes (Signed)
Reviewed home exercise program with pt.  Discussed mode/frequency of exercise, THRR, RPE scale and weather conditions for exercising outdoors.  Also discussed signs and symptoms and when to call Dr./[email or web address].    Cleda Mccreedy, MS ACSM RCEP 12/27/2016 16:57

## 2016-12-27 NOTE — Addendum Note (Signed)
Addended by: Eulis Foster on: 12/27/2016 08:48 AM   Modules accepted: Orders

## 2016-12-29 ENCOUNTER — Encounter (HOSPITAL_COMMUNITY): Payer: BC Managed Care – PPO

## 2017-01-01 ENCOUNTER — Encounter (HOSPITAL_COMMUNITY)
Admission: RE | Admit: 2017-01-01 | Discharge: 2017-01-01 | Disposition: A | Discharge status: Home / Self Care | Payer: BC Managed Care – PPO | Source: Ambulatory Visit | Attending: Interventional Cardiology | Admitting: Interventional Cardiology

## 2017-01-01 DIAGNOSIS — I5042 Chronic combined systolic (congestive) and diastolic (congestive) heart failure: Secondary | ICD-10-CM | POA: Diagnosis not present

## 2017-01-01 DIAGNOSIS — I5022 Chronic systolic (congestive) heart failure: Secondary | ICD-10-CM

## 2017-01-03 ENCOUNTER — Encounter (HOSPITAL_COMMUNITY)
Admission: RE | Admit: 2017-01-03 | Discharge: 2017-01-03 | Disposition: A | Discharge status: Home / Self Care | Payer: BC Managed Care – PPO | Source: Ambulatory Visit | Attending: Interventional Cardiology | Admitting: Interventional Cardiology

## 2017-01-03 DIAGNOSIS — I5022 Chronic systolic (congestive) heart failure: Secondary | ICD-10-CM

## 2017-01-03 DIAGNOSIS — I5042 Chronic combined systolic (congestive) and diastolic (congestive) heart failure: Secondary | ICD-10-CM | POA: Diagnosis not present

## 2017-01-05 ENCOUNTER — Encounter (HOSPITAL_COMMUNITY)
Admission: RE | Admit: 2017-01-05 | Discharge: 2017-01-05 | Disposition: A | Discharge status: Home / Self Care | Payer: BC Managed Care – PPO | Source: Ambulatory Visit | Attending: Interventional Cardiology | Admitting: Interventional Cardiology

## 2017-01-05 DIAGNOSIS — I5042 Chronic combined systolic (congestive) and diastolic (congestive) heart failure: Secondary | ICD-10-CM | POA: Diagnosis not present

## 2017-01-05 DIAGNOSIS — I5022 Chronic systolic (congestive) heart failure: Secondary | ICD-10-CM

## 2017-01-08 ENCOUNTER — Encounter (HOSPITAL_COMMUNITY)
Admission: RE | Admit: 2017-01-08 | Discharge: 2017-01-08 | Disposition: A | Discharge status: Home / Self Care | Payer: BC Managed Care – PPO | Source: Ambulatory Visit | Attending: Interventional Cardiology | Admitting: Interventional Cardiology

## 2017-01-08 DIAGNOSIS — I5042 Chronic combined systolic (congestive) and diastolic (congestive) heart failure: Secondary | ICD-10-CM | POA: Diagnosis not present

## 2017-01-08 DIAGNOSIS — I5022 Chronic systolic (congestive) heart failure: Secondary | ICD-10-CM

## 2017-01-10 ENCOUNTER — Encounter (HOSPITAL_COMMUNITY)
Admission: RE | Admit: 2017-01-10 | Discharge: 2017-01-10 | Disposition: A | Discharge status: Home / Self Care | Payer: BC Managed Care – PPO | Source: Ambulatory Visit | Attending: Interventional Cardiology | Admitting: Interventional Cardiology

## 2017-01-10 DIAGNOSIS — I5042 Chronic combined systolic (congestive) and diastolic (congestive) heart failure: Secondary | ICD-10-CM | POA: Diagnosis not present

## 2017-01-10 DIAGNOSIS — I5022 Chronic systolic (congestive) heart failure: Secondary | ICD-10-CM

## 2017-01-12 ENCOUNTER — Encounter (HOSPITAL_COMMUNITY)
Admission: RE | Admit: 2017-01-12 | Discharge: 2017-01-12 | Disposition: A | Discharge status: Home / Self Care | Payer: BC Managed Care – PPO | Source: Ambulatory Visit | Attending: Interventional Cardiology | Admitting: Interventional Cardiology

## 2017-01-12 DIAGNOSIS — I5022 Chronic systolic (congestive) heart failure: Secondary | ICD-10-CM

## 2017-01-12 DIAGNOSIS — I5042 Chronic combined systolic (congestive) and diastolic (congestive) heart failure: Secondary | ICD-10-CM | POA: Diagnosis not present

## 2017-01-15 ENCOUNTER — Encounter (HOSPITAL_COMMUNITY)
Admission: RE | Admit: 2017-01-15 | Discharge: 2017-01-15 | Disposition: A | Discharge status: Home / Self Care | Payer: BC Managed Care – PPO | Source: Ambulatory Visit | Attending: Interventional Cardiology | Admitting: Interventional Cardiology

## 2017-01-15 DIAGNOSIS — I5022 Chronic systolic (congestive) heart failure: Secondary | ICD-10-CM

## 2017-01-15 DIAGNOSIS — I5042 Chronic combined systolic (congestive) and diastolic (congestive) heart failure: Secondary | ICD-10-CM | POA: Diagnosis not present

## 2017-01-16 NOTE — Progress Notes (Signed)
Cardiac Individual Treatment Plan  Patient Details  Name: Ugo Thoma  MD MRN: [id number] Date of Birth: June 09, 1951 Referring Provider:     [place] from 12/14/2016 in [place]  Referring Provider  Daneen Schick MD      Initial Encounter Date:    CARDIAC REHAB PHASE II ORIENTATION from 12/14/2016 in [place]  Date  12/14/16  Referring Provider  Daneen Schick MD      Visit Diagnosis: Heart failure, chronic systolic ([place])  Patient's Home Medications on Admission:  Current Outpatient Prescriptions:  .  acetaminophen (TYLENOL) 500 MG tablet, Take 500 mg by mouth every 6 (six) hours as needed., Disp: , Rfl:  .  amiodarone (PACERONE) 200 MG tablet, Take 1 tablet (200 mg total) by mouth daily., Disp: 30 tablet, Rfl: 11 .  apixaban (ELIQUIS) 5 MG TABS tablet, Take 1 tablet (5 mg total) by mouth 2 (two) times daily., Disp: 60 tablet, Rfl: 11 .  carvedilol (COREG) 25 MG tablet, Take 1 tablet (25 mg total) by mouth 2 (two) times daily., Disp: 60 tablet, Rfl: 0 .  cholecalciferol (VITAMIN D) 1000 units tablet, Take 1,000 Units by mouth daily., Disp: , Rfl:  .  diclofenac sodium (VOLTAREN) 1 % GEL, Apply 2 g topically 4 (four) times daily., Disp: 1 Tube, Rfl: 1 .  docusate sodium (COLACE) 100 MG capsule, Take 100 mg by mouth daily as needed for mild constipation., Disp: , Rfl:  .  fluticasone (FLONASE) 50 MCG/ACT nasal spray, Place 2 sprays into the nose as needed for allergies. , Disp: , Rfl:  .  furosemide (LASIX) 40 MG tablet, Take 1 tablet (40 mg total) by mouth daily., Disp: 90 tablet, Rfl: 0 .  isosorbide-hydrALAZINE (BIDIL) 20-37.5 MG tablet, Take 0.5 tablets by mouth 3 (three) times daily., Disp: 90 tablet, Rfl: 0 .  levothyroxine (SYNTHROID, LEVOTHROID) 75 MCG tablet, Take 1 tablet (75 mcg total) by mouth daily before breakfast., Disp: 30 tablet, Rfl: 11 .  magnesium oxide (MAG-OX) 400 MG  tablet, Take 1 tablet (400 mg total) by mouth daily., Disp: 30 tablet, Rfl: 1 .  oxyCODONE-acetaminophen (PERCOCET/ROXICET) 5-325 MG tablet, Take 1 tablet by mouth every 6 (six) hours as needed for moderate pain. (Patient not taking: Reported on 12/14/2016), Disp: 90 tablet, Rfl: 0 .  rosuvastatin (CRESTOR) 20 MG tablet, Take 1 tablet (20 mg total) by mouth daily., Disp: 90 tablet, Rfl: 3 .  vitamin B-12 (CYANOCOBALAMIN) 100 MCG tablet, Take 100 mcg by mouth daily., Disp: , Rfl:   Past Medical History: Past Medical History:  Diagnosis Date  . CHF (congestive heart failure) ([place])   . Chronic kidney disease    kidney fx studies increased   . Chronic lower back pain   . Claustrophobia   . Dysrhythmia    "palpitations"  . Exertional dyspnea 01/2012  . Heart murmur   . Hypertension   . Hypothyroidism   . Migraine 02/13/12   "opthalmic"  . Varicose vein of leg    right    Tobacco Use: History  Smoking Status  . Never Smoker  Smokeless Tobacco  . Never Used    Labs: Recent Review Flowsheet Data    Labs for ITP Cardiac and Pulmonary Rehab Latest Ref Rng & Units 02/01/2016 02/02/2016 02/03/2016 02/10/2016 12/27/2016   Cholestrol 100 - 199 mg/dL - - - - 163   LDLCALC 0 - 99 mg/dL - - - - 85  HDL >39 mg/dL - - - - 64   Trlycerides 0 - 149 mg/dL - - - - 72   Hemoglobin A1c 4.8 - 5.6 % - - - 7.1(H) -   PHART 7.350 - 7.450 - - 7.312(L) - -   PCO2ART 35.0 - 45.0 mmHg - - 37.3 - -   HCO3 20.0 - 24.0 mEq/L - - 18.9(L) - -   TCO2 0 - 100 mmol/L 18 21 20  - -   ACIDBASEDEF 0.0 - 2.0 mmol/L - - 7.0(H) - -   O2SAT % - - 96.0 - -      Capillary Blood Glucose: Lab Results  Component Value Date   GLUCAP 109 (H) 03/06/2016   GLUCAP 100 (H) 03/06/2016   GLUCAP 116 (H) 03/05/2016   GLUCAP 126 (H) 03/05/2016   GLUCAP 107 (H) 03/05/2016     Exercise Target Goals:    Exercise Program Goal: Individual exercise prescription set with THRR, safety & activity barriers. Participant demonstrates  ability to understand and report RPE using BORG scale, to self-measure pulse accurately, and to acknowledge the importance of the exercise prescription.  Exercise Prescription Goal: Starting with aerobic activity 30 plus minutes a day, 3 days per week for initial exercise prescription. Provide home exercise prescription and guidelines that participant acknowledges understanding prior to discharge.  Activity Barriers & Risk Stratification:     Activity Barriers & Cardiac Risk Stratification - 12/14/16 1435      Activity Barriers & Cardiac Risk Stratification   Activity Barriers Deconditioning;Assistive Device;Back Problems;Other (comment)   Comments R knee and quad pain,    Cardiac Risk Stratification High      6 Minute Walk:     6 Minute Walk    Row Name 12/14/16 1557         6 Minute Walk   Phase Initial     Distance 1337 feet     Walk Time 6 minutes     # of Rest Breaks 0     MPH 2.53     METS 3.1     RPE 11     VO2 Peak 10.76     Symptoms No     Resting HR 69 bpm     Resting BP 158/84     Max Ex. HR 108 bpm     Max Ex. BP 164/88     2 Minute Post BP 140/62        Oxygen Initial Assessment:   Oxygen Re-Evaluation:   Oxygen Discharge (Final Oxygen Re-Evaluation):   Initial Exercise Prescription:     Initial Exercise Prescription - 12/14/16 1500      Date of Initial Exercise RX and Referring Provider   Date 12/14/16   Referring Provider Daneen Schick MD     Treadmill   MPH 2   Grade 0   Minutes 10   METs 2.53     Recumbant Bike   Level 1.5   Minutes 10   METs 2.44     NuStep   Level 1   Minutes 10   METs 2.1     Prescription Details   Frequency (times per week) 5   Duration Progress to 45 minutes of aerobic exercise without signs/symptoms of physical distress     Intensity   THRR 40-80% of Max Heartrate 62-124   Ratings of Perceived Exertion 11-13   Perceived Dyspnea 0-4     Progression   Progression Continue to progress workloads  to maintain intensity without signs/symptoms  of physical distress.     Resistance Training   Training Prescription Yes   Weight 2   Reps 10-15      Perform Capillary Blood Glucose checks as needed.  Exercise Prescription Changes:     Exercise Prescription Changes    Row Name 01/12/17 1400             Response to Exercise   Blood Pressure (Admit) 140/80       Blood Pressure (Exercise) 152/70       Blood Pressure (Exit) 122/70       Heart Rate (Admit) 66 bpm       Heart Rate (Exercise) 94 bpm       Heart Rate (Exit) 78 bpm       Rating of Perceived Exertion (Exercise) 13       Duration Progress to 45 minutes of aerobic exercise without signs/symptoms of physical distress       Intensity THRR unchanged         Progression   Progression Continue to progress workloads to maintain intensity without signs/symptoms of physical distress.       Average METs 2.8         Resistance Training   Training Prescription Yes       Weight 2       Reps 10-15         Treadmill   MPH 2.5       Grade 1       Minutes 10       METs 3.26         Recumbant Bike   Level 1.53       Minutes 10       METs 2.7         NuStep   Level 3       Minutes 10       METs 2.5         Home Exercise Plan   Plans to continue exercise at Home (comment)       Frequency Add 3 additional days to program exercise sessions.       Initial Home Exercises Provided 12/27/16          Exercise Comments:     Exercise Comments    Row Name 12/18/16 1635 01/12/17 1535         Exercise Comments Off to a great start with exercise! Reviewed METs and goals with pt.  Pt is doing well with exercise.         Exercise Goals and Review:     Exercise Goals    Row Name 12/14/16 1413             Exercise Goals   Increase Physical Activity Yes       Intervention Provide advice, education, support and counseling about physical activity/exercise needs.;Develop an individualized exercise prescription for  aerobic and resistive training based on initial evaluation findings, risk stratification, comorbidities and participant's personal goals.       Expected Outcomes Achievement of increased cardiorespiratory fitness and enhanced flexibility, muscular endurance and strength shown through measurements of functional capacity and personal statement of participant.       Increase Strength and Stamina Yes       Intervention Provide advice, education, support and counseling about physical activity/exercise needs.;Develop an individualized exercise prescription for aerobic and resistive training based on initial evaluation findings, risk stratification, comorbidities and participant's personal goals.       Expected Outcomes  Achievement of increased cardiorespiratory fitness and enhanced flexibility, muscular endurance and strength shown through measurements of functional capacity and personal statement of participant.          Exercise Goals Re-Evaluation :     Exercise Goals Re-Evaluation    Row Name 01/12/17 1533             Exercise Goal Re-Evaluation   Exercise Goals Review Increase Physical Activity;Increase Strenth and Stamina       Comments Pt states he feels stronger and he has more energy.  He was able to do some yard work for the first time in a long time without feeling exhausted.       Expected Outcomes Continue with exercise prescription and home exercise program in order to increase cardiorespiratory fitness.             Discharge Exercise Prescription (Final Exercise Prescription Changes):     Exercise Prescription Changes - 01/12/17 1400      Response to Exercise   Blood Pressure (Admit) 140/80   Blood Pressure (Exercise) 152/70   Blood Pressure (Exit) 122/70   Heart Rate (Admit) 66 bpm   Heart Rate (Exercise) 94 bpm   Heart Rate (Exit) 78 bpm   Rating of Perceived Exertion (Exercise) 13   Duration Progress to 45 minutes of aerobic exercise without signs/symptoms of  physical distress   Intensity THRR unchanged     Progression   Progression Continue to progress workloads to maintain intensity without signs/symptoms of physical distress.   Average METs 2.8     Resistance Training   Training Prescription Yes   Weight 2   Reps 10-15     Treadmill   MPH 2.5   Grade 1   Minutes 10   METs 3.26     Recumbant Bike   Level 1.53   Minutes 10   METs 2.7     NuStep   Level 3   Minutes 10   METs 2.5     Home Exercise Plan   Plans to continue exercise at Home (comment)   Frequency Add 3 additional days to program exercise sessions.   Initial Home Exercises Provided 12/27/16      Nutrition:  Target Goals: Understanding of nutrition guidelines, daily intake of sodium 1500mg , cholesterol 200mg , calories 30% from fat and 7% or less from saturated fats, daily to have 5 or more servings of fruits and vegetables.  Biometrics:     Pre Biometrics - 12/14/16 1604      Pre Biometrics   Height 5' 7.75" (1.721 m)   Weight 236 lb 12.4 oz (107.4 kg)   Waist Circumference 47 inches   Hip Circumference 50 inches   Waist to Hip Ratio 0.94 %   BMI (Calculated) 36.3   Triceps Skinfold 18 mm   % Body Fat 34.8 %   Grip Strength 34.5 kg   Flexibility 11.5 in   Single Leg Stand 14.85 seconds       Nutrition Therapy Plan and Nutrition Goals:     Nutrition Therapy & Goals - 12/15/16 0802      Nutrition Therapy   Diet Carb Modified, Therapeutic Lifestyle Changes     Personal Nutrition Goals   Nutrition Goal Wt loss of 1-2 lb/week to a wt loss goal of 6-24 lb at graduation from Cardiac Rehab.      Intervention Plan   Intervention Prescribe, educate and counsel regarding individualized specific dietary modifications aiming towards targeted core components such as weight,  hypertension, lipid management, diabetes, heart failure and other comorbidities.   Expected Outcomes Short Term Goal: Understand basic principles of dietary content, such as  calories, fat, sodium, cholesterol and nutrients.;Long Term Goal: Adherence to prescribed nutrition plan.      Nutrition Discharge: Nutrition Scores:   Nutrition Goals Re-Evaluation:   Nutrition Goals Re-Evaluation:   Nutrition Goals Discharge (Final Nutrition Goals Re-Evaluation):   Psychosocial: Target Goals: Acknowledge presence or absence of significant depression and/or stress, maximize coping skills, provide positive support system. Participant is able to verbalize types and ability to use techniques and skills needed for reducing stress and depression.  Initial Review & Psychosocial Screening:     Initial Psych Review & Screening - 12/18/16 1635      Screening Interventions   Interventions Encouraged to exercise      Quality of Life Scores:     Quality of Life - 12/22/16 1610      Quality of Life Scores   Health/Function Pre 21.6 %   Socioeconomic Pre 25.64 %   Psych/Spiritual Pre 23.14 %   Family Pre 24 %   GLOBAL Pre 23.1 %      PHQ-9: Recent Review Flowsheet Data    Depression screen [facility] 2/9 12/18/2016   Decreased Interest 0   Down, Depressed, Hopeless 0   PHQ - 2 Score 0     Interpretation of Total Score  Total Score Depression Severity:  1-4 = Minimal depression, 5-9 = Mild depression, 10-14 = Moderate depression, 15-19 = Moderately severe depression, 20-27 = Severe depression   Psychosocial Evaluation and Intervention:   Psychosocial Re-Evaluation:     Psychosocial Re-Evaluation    [place] Name 12/20/16 1801 01/16/17 1540           Psychosocial Re-Evaluation   Current issues with None Identified None Identified      Interventions Encouraged to attend Cardiac Rehabilitation for the exercise Encouraged to attend Cardiac Rehabilitation for the exercise      Continue Psychosocial Services  No Follow up required No Follow up required         Psychosocial Discharge (Final Psychosocial Re-Evaluation):     Psychosocial Re-Evaluation -  01/16/17 1540      Psychosocial Re-Evaluation   Current issues with None Identified   Interventions Encouraged to attend Cardiac Rehabilitation for the exercise   Continue Psychosocial Services  No Follow up required      Vocational Rehabilitation: Provide vocational rehab assistance to qualifying candidates.   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      Education: Education Goals: Education classes will be provided on a weekly basis, covering required topics. Participant will state understanding/return demonstration of topics presented.  Learning Barriers/Preferences:     Learning Barriers/Preferences - 12/14/16 1412      Learning Barriers/Preferences   Learning Barriers Sight  reading   Learning Preferences Skilled Demonstration;Written Material      Education Topics: Count Your Pulse:  -Group instruction provided by verbal instruction, demonstration, patient participation and written materials to support subject.  Instructors address importance of being able to find your pulse and how to count your pulse when at home without a heart monitor.  Patients get hands on experience counting their pulse with staff help and  individually.   Heart Attack, Angina, and Risk Factor Modification:  -Group instruction provided by verbal instruction, video, and written materials to support subject.  Instructors address signs and symptoms of angina and heart attacks.    Also discuss risk factors for heart disease and how to make changes to improve heart health risk factors.   Functional Fitness:  -Group instruction provided by verbal instruction, demonstration, patient participation, and written materials to support subject.  Instructors address  safety measures for doing things around the house.  Discuss how to get up and down off the floor, how to pick things up properly, how to safely get out of a chair without assistance, and balance training.   Meditation and Mindfulness:  -Group instruction provided by verbal instruction, patient participation, and written materials to support subject.  Instructor addresses importance of mindfulness and meditation practice to help reduce stress and improve awareness.  Instructor also leads participants through a meditation exercise.    CARDIAC REHAB PHASE II EXERCISE from 01/10/2017 in [place]  Date  01/10/17  Instruction Review Code  2- meets goals/outcomes      Stretching for Flexibility and Mobility:  -Group instruction provided by verbal instruction, patient participation, and written materials to support subject.  Instructors lead participants through series of stretches that are designed to increase flexibility thus improving mobility.  These stretches are additional exercise for major muscle groups that are typically performed during regular warm up and cool down.   Hands Only CPR Anytime:  -Group instruction provided by verbal instruction, video, patient participation and written materials to support subject.  Instructors co-teach with AHA video for hands only CPR.  Participants get hands on experience with mannequins.   Nutrition I class: Heart Healthy Eating:  -Group instruction provided by PowerPoint slides, verbal discussion, and written materials to support subject matter. The instructor gives an explanation and review of the Therapeutic Lifestyle Changes diet recommendations, which includes a discussion on lipid goals, dietary fat, sodium, fiber, plant stanol/sterol esters, sugar, and the components of a well-balanced, healthy diet.   CARDIAC REHAB PHASE II EXERCISE from 01/10/2017 in [place]  Date  12/25/16   Educator  RD  Instruction Review Code  Not applicable [class handouts given]      Nutrition II class: Lifestyle Skills:  -Group instruction provided by PowerPoint slides, verbal discussion, and written materials to support subject matter. The instructor gives an explanation and review of label reading, grocery shopping for heart health, heart healthy recipe modifications, and ways to make healthier choices when eating out.   CARDIAC REHAB PHASE II EXERCISE from 01/10/2017 in [place]  Date  12/25/16  Educator  RD  Instruction Review Code  Not applicable [class handouts given]      Diabetes Question & Answer:  -Group instruction provided by PowerPoint slides, verbal discussion, and written materials to support subject matter. The instructor gives an explanation and review of diabetes co-morbidities, pre- and post-prandial blood glucose goals, pre-exercise blood glucose goals, signs, symptoms, and treatment of hypoglycemia and hyperglycemia, and foot care basics.   Diabetes Blitz:  -Group instruction provided by PowerPoint slides, verbal discussion, and written materials to support subject matter. The instructor gives an explanation and review of the physiology behind type 1 and type 2 diabetes, diabetes medications and rational behind using different medications, pre- and post-prandial blood glucose recommendations and Hemoglobin A1c goals, diabetes diet, and exercise including blood glucose guidelines for  exercising safely.    CARDIAC REHAB PHASE II EXERCISE from 01/10/2017 in [place]  Date  12/25/16  Educator  RD  Instruction Review Code  Not applicable [class handouts given]      Portion Distortion:  -Group instruction provided by PowerPoint slides, verbal discussion, written materials, and food models to support subject matter. The instructor gives an explanation of serving size versus portion size, changes in  portions sizes over the last 20 years, and what consists of a serving from each food group.   Stress Management:  -Group instruction provided by verbal instruction, video, and written materials to support subject matter.  Instructors review role of stress in heart disease and how to cope with stress positively.     Exercising on Your Own:  -Group instruction provided by verbal instruction, power point, and written materials to support subject.  Instructors discuss benefits of exercise, components of exercise, frequency and intensity of exercise, and end points for exercise.  Also discuss use of nitroglycerin and activating EMS.  Review options of places to exercise outside of rehab.  Review guidelines for sex with heart disease.   Cardiac Drugs I:  -Group instruction provided by verbal instruction and written materials to support subject.  Instructor reviews cardiac drug classes: antiplatelets, anticoagulants, beta blockers, and statins.  Instructor discusses reasons, side effects, and lifestyle considerations for each drug class.   Cardiac Drugs II:  -Group instruction provided by verbal instruction and written materials to support subject.  Instructor reviews cardiac drug classes: angiotensin converting enzyme inhibitors (ACE-I), angiotensin II receptor blockers (ARBs), nitrates, and calcium channel blockers.  Instructor discusses reasons, side effects, and lifestyle considerations for each drug class.   Anatomy and Physiology of the Circulatory System:  -Group instruction provided by verbal instruction, video, and written materials to support subject.  Reviews functional anatomy of heart, how it relates to various diagnoses, and what role the heart plays in the overall system.   Knowledge Questionnaire Score:   Core Components/Risk Factors/Patient Goals at Admission:     Personal Goals and Risk Factors at Admission - 12/14/16 1413      Core Components/Risk Factors/Patient Goals on  Admission    Weight Management Yes;Weight Loss   Intervention Weight Management: Develop a combined nutrition and exercise program designed to reach desired caloric intake, while maintaining appropriate intake of nutrient and fiber, sodium and fats, and appropriate energy expenditure required for the weight goal.;Weight Management: Provide education and appropriate resources to help participant work on and attain dietary goals.;Weight Management/Obesity: Establish reasonable short term and long term weight goals.   Expected Outcomes Short Term: Continue to assess and modify interventions until short term weight is achieved;Long Term: Adherence to nutrition and physical activity/exercise program aimed toward attainment of established weight goal;Weight Maintenance: Understanding of the daily nutrition guidelines, which includes 25-35% calories from fat, 7% or less cal from saturated fats, less than 200mg  cholesterol, less than 1.5gm of sodium, & 5 or more servings of fruits and vegetables daily;Weight Loss: Understanding of general recommendations for a balanced deficit meal plan, which promotes 1-2 lb weight loss per week and includes a negative energy balance of [phone] kcal/d;Understanding recommendations for meals to include 15-35% energy as protein, 25-35% energy from fat, 35-60% energy from carbohydrates, less than 200mg  of dietary cholesterol, 20-35 gm of total fiber daily;Understanding of distribution of calorie intake throughout the day with the consumption of 4-5 meals/snacks   Heart Failure Yes   Intervention Provide a combined  exercise and nutrition program that is supplemented with education, support and counseling about heart failure. Directed toward relieving symptoms such as shortness of breath, decreased exercise tolerance, and extremity edema.   Expected Outcomes Improve functional capacity of life;Short term: Attendance in program 2-3 days a week with increased exercise capacity. Reported  lower sodium intake. Reported increased fruit and vegetable intake. Reports medication compliance.;Short term: Daily weights obtained and reported for increase. Utilizing diuretic protocols set by physician.;Long term: Adoption of self-care skills and reduction of barriers for early signs and symptoms recognition and intervention leading to self-care maintenance.   Hypertension Yes   Intervention Provide education on lifestyle modifcations including regular physical activity/exercise, weight management, moderate sodium restriction and increased consumption of fresh fruit, vegetables, and low fat dairy, alcohol moderation, and smoking cessation.;Monitor prescription use compliance.   Expected Outcomes Short Term: Continued assessment and intervention until BP is < 140/100mm HG in hypertensive participants. < 130/28mm HG in hypertensive participants with diabetes, heart failure or chronic kidney disease.;Long Term: Maintenance of blood pressure at goal levels.   Lipids Yes   Intervention Provide education and support for participant on nutrition & aerobic/resistive exercise along with prescribed medications to achieve LDL 70mg , HDL >40mg .   Expected Outcomes Short Term: Participant states understanding of desired cholesterol values and is compliant with medications prescribed. Participant is following exercise prescription and nutrition guidelines.;Long Term: Cholesterol controlled with medications as prescribed, with individualized exercise RX and with personalized nutrition plan. Value goals: LDL < 70mg , HDL > 40 mg.   Stress Yes   Intervention Offer individual and/or small group education and counseling on adjustment to heart disease, stress management and health-related lifestyle change. Teach and support self-help strategies.;Refer participants experiencing significant psychosocial distress to appropriate mental health specialists for further evaluation and treatment. When possible, include family members  and significant others in education/counseling sessions.   Expected Outcomes Short Term: Participant demonstrates changes in health-related behavior, relaxation and other stress management skills, ability to obtain effective social support, and compliance with psychotropic medications if prescribed.;Long Term: Emotional wellbeing is indicated by absence of clinically significant psychosocial distress or social isolation.   Personal Goal Other Yes   Personal Goal Be able to walk up and down the hill, negogiate stairs without difficulty. Lose weight in the belly region goal weight ~200lbs   Intervention Provide exercise programming to assist with improve functional mobility and cardiorespiratory fitness.    Expected Outcomes Pt will lose weight, gain muscle mass and be able to negogiate stairs and walk up/downhill.      Core Components/Risk Factors/Patient Goals Review:    Core Components/Risk Factors/Patient Goals at Discharge (Final Review):    ITP Comments:     ITP Comments    Row Name 12/14/16 1410           ITP Comments Dr. 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 Recommend continued exercise and life style modification education including  stress management and relaxation techniques to decrease cardiac risk profile. 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

## 2017-01-17 ENCOUNTER — Encounter (HOSPITAL_COMMUNITY): Payer: BC Managed Care – PPO

## 2017-01-19 ENCOUNTER — Encounter (HOSPITAL_COMMUNITY)
Admission: RE | Admit: 2017-01-19 | Discharge: 2017-01-19 | Disposition: A | Discharge status: Home / Self Care | Payer: BC Managed Care – PPO | Source: Ambulatory Visit | Attending: Interventional Cardiology | Admitting: Interventional Cardiology

## 2017-01-19 DIAGNOSIS — I5042 Chronic combined systolic (congestive) and diastolic (congestive) heart failure: Secondary | ICD-10-CM | POA: Diagnosis not present

## 2017-01-19 DIAGNOSIS — I5022 Chronic systolic (congestive) heart failure: Secondary | ICD-10-CM

## 2017-01-22 ENCOUNTER — Encounter (HOSPITAL_COMMUNITY)
Admission: RE | Admit: 2017-01-22 | Discharge: 2017-01-22 | Disposition: A | Discharge status: Home / Self Care | Payer: BC Managed Care – PPO | Source: Ambulatory Visit | Attending: Interventional Cardiology | Admitting: Interventional Cardiology

## 2017-01-22 DIAGNOSIS — I5022 Chronic systolic (congestive) heart failure: Secondary | ICD-10-CM

## 2017-01-22 DIAGNOSIS — I5042 Chronic combined systolic (congestive) and diastolic (congestive) heart failure: Secondary | ICD-10-CM | POA: Diagnosis not present

## 2017-01-24 ENCOUNTER — Encounter (HOSPITAL_COMMUNITY): Payer: Self-pay

## 2017-01-26 ENCOUNTER — Encounter (HOSPITAL_COMMUNITY)
Admission: RE | Admit: 2017-01-26 | Discharge: 2017-01-26 | Disposition: A | Discharge status: Home / Self Care | Payer: BC Managed Care – PPO | Source: Ambulatory Visit | Attending: Interventional Cardiology | Admitting: Interventional Cardiology

## 2017-01-26 DIAGNOSIS — I469 Cardiac arrest, cause unspecified: Secondary | ICD-10-CM | POA: Insufficient documentation

## 2017-01-26 DIAGNOSIS — I5022 Chronic systolic (congestive) heart failure: Secondary | ICD-10-CM

## 2017-01-26 DIAGNOSIS — I5023 Acute on chronic systolic (congestive) heart failure: Secondary | ICD-10-CM | POA: Insufficient documentation

## 2017-01-29 ENCOUNTER — Encounter (HOSPITAL_COMMUNITY)
Admission: RE | Admit: 2017-01-29 | Discharge: 2017-01-29 | Disposition: A | Discharge status: Home / Self Care | Payer: BC Managed Care – PPO | Source: Ambulatory Visit | Attending: Interventional Cardiology | Admitting: Interventional Cardiology

## 2017-01-29 DIAGNOSIS — I5022 Chronic systolic (congestive) heart failure: Secondary | ICD-10-CM

## 2017-01-31 ENCOUNTER — Encounter (HOSPITAL_COMMUNITY)
Admission: RE | Admit: 2017-01-31 | Discharge: 2017-01-31 | Disposition: A | Discharge status: Home / Self Care | Payer: BC Managed Care – PPO | Source: Ambulatory Visit | Attending: Interventional Cardiology | Admitting: Interventional Cardiology

## 2017-01-31 DIAGNOSIS — I5022 Chronic systolic (congestive) heart failure: Secondary | ICD-10-CM

## 2017-02-02 ENCOUNTER — Encounter (HOSPITAL_COMMUNITY)
Admission: RE | Admit: 2017-02-02 | Discharge: 2017-02-02 | Disposition: A | Discharge status: Home / Self Care | Payer: BC Managed Care – PPO | Source: Ambulatory Visit | Attending: Interventional Cardiology | Admitting: Interventional Cardiology

## 2017-02-02 DIAGNOSIS — I5022 Chronic systolic (congestive) heart failure: Secondary | ICD-10-CM

## 2017-02-05 ENCOUNTER — Encounter (HOSPITAL_COMMUNITY)
Admission: RE | Admit: 2017-02-05 | Discharge: 2017-02-05 | Disposition: A | Discharge status: Home / Self Care | Payer: BC Managed Care – PPO | Source: Ambulatory Visit | Attending: Interventional Cardiology | Admitting: Interventional Cardiology

## 2017-02-05 DIAGNOSIS — I5022 Chronic systolic (congestive) heart failure: Secondary | ICD-10-CM

## 2017-02-07 ENCOUNTER — Encounter (HOSPITAL_COMMUNITY)
Admission: RE | Admit: 2017-02-07 | Discharge: 2017-02-07 | Disposition: A | Discharge status: Home / Self Care | Payer: BC Managed Care – PPO | Source: Ambulatory Visit | Attending: Interventional Cardiology | Admitting: Interventional Cardiology

## 2017-02-07 DIAGNOSIS — I5022 Chronic systolic (congestive) heart failure: Secondary | ICD-10-CM

## 2017-02-08 NOTE — Progress Notes (Signed)
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 Congestive Heart Failure  . 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 male nonischemic cardiomyopathy, hypertension, aortic aneurysm, paroxysmal atrial fibrillation, chronic kidney disease stage III-IV, amiodarone therapy, status post cardiac arrest, status post retroperitoneal hematoma, and recently implanted AICD.  He is noticing improvement overall. His breathing is markedly improved since AICD/CRT following cardiac arrest. He denies chest pain. There is no orthopnea, PND, ankle edema, orthopnea, or claudication. He has not had syncope. No palpitations.  Past Medical History:  Diagnosis Date  . CHF (congestive heart failure) ([place])   . Chronic kidney disease    kidney fx studies increased   . Chronic lower back pain   . Claustrophobia   . Dysrhythmia    "palpitations"  . Exertional dyspnea 01/2012  . Heart murmur   . Hypertension   . Hypothyroidism   . Migraine 02/13/12   "opthalmic"  . Varicose vein of leg    right    Past Surgical History:  Procedure Laterality Date  . CARDIAC CATHETERIZATION N/A 02/17/2016   Procedure: Left Heart Cath and Coronary Angiography;  Surgeon: Jettie Booze, MD;  Location: [place] [facility];  Service: Cardiovascular;  Laterality: N/A;  . CARDIOVERSION  03/22/2012   Procedure: CARDIOVERSION;  Surgeon: Candee Furbish, MD;  Location: [facility] ENDOSCOPY;  Service: Cardiovascular;  Laterality: N/A;  . CARDIOVERSION  04/19/2012   Procedure: CARDIOVERSION;  Surgeon: Sinclair Grooms, MD;  Location: [place];  Service: Cardiovascular;  Laterality: N/A;  . COLONOSCOPY N/A 04/11/2013   Procedure: COLONOSCOPY;  Surgeon: Inda Castle, MD;  Location: [facility] ENDOSCOPY;  Service: Endoscopy;  Laterality: N/A;  .  COLONOSCOPY N/A 04/11/2013   Procedure: COLONOSCOPY;  Surgeon: Inda Castle, MD;  Location: [facility] ENDOSCOPY;  Service: Endoscopy;  Laterality: N/A;  . EP IMPLANTABLE DEVICE N/A 02/17/2016   Procedure: BiV ICD Insertion CRT-D;  Surgeon: Evans Lance, MD;  Location: [place] [facility];  Service: Cardiovascular;  Laterality: N/A;  . FINGER SURGERY  2012   "4th digit right hand; thumb on left hand"  . RADIOLOGY WITH ANESTHESIA N/A 02/11/2016   Procedure: MRI OF THE BRAIN WITHOUT CONTRAST, LUMBAR WITHOUT CONTRAST;  Surgeon: Medication Radiologist, MD;  Location: [place];  Service: Radiology;  Laterality: N/A;  DR. WOOD/MRI  . Skin melanocytoma excision  2012   "above left clavicle"  . TEE WITHOUT CARDIOVERSION  03/22/2012   Procedure: TRANSESOPHAGEAL ECHOCARDIOGRAM (TEE);  Surgeon: Candee Furbish, MD;  Location: [facility] ENDOSCOPY;  Service: Cardiovascular;  Laterality: N/A;  . TEE WITHOUT CARDIOVERSION N/A 02/08/2016   Procedure: TRANSESOPHAGEAL ECHOCARDIOGRAM (TEE);  Surgeon: Lelon Perla, MD;  Location: [facility] ENDOSCOPY;  Service: Cardiovascular;  Laterality: N/A;    Current Medications: Outpatient Medications Prior to Visit  Medication Sig Dispense Refill  . acetaminophen (TYLENOL) 500 MG tablet Take 500 mg by mouth every 6 (six) hours as needed.    . cholecalciferol (VITAMIN D) 1000 units tablet Take 1,000 Units by mouth daily.    Marland Kitchen docusate sodium (COLACE) 100 MG capsule Take 100 mg by mouth daily as needed for mild constipation.    . fluticasone (FLONASE) 50 MCG/ACT nasal spray Place 2 sprays into the nose  as needed for allergies.     Marland Kitchen levothyroxine (SYNTHROID, LEVOTHROID) 75 MCG tablet Take 1 tablet (75 mcg total) by mouth daily before breakfast. 30 tablet 11  . magnesium oxide (MAG-OX) 400 MG tablet Take 1 tablet (400 mg total) by mouth daily. 30 tablet 1  . vitamin B-12 (CYANOCOBALAMIN) 100 MCG tablet Take 100 mcg by mouth daily as needed (energy).     Marland Kitchen amiodarone (PACERONE) 200 MG tablet Take 1  tablet (200 mg total) by mouth daily. 30 tablet 11  . apixaban (ELIQUIS) 5 MG TABS tablet Take 1 tablet (5 mg total) by mouth 2 (two) times daily. 60 tablet 11  . carvedilol (COREG) 25 MG tablet Take 1 tablet (25 mg total) by mouth 2 (two) times daily. 60 tablet 0  . furosemide (LASIX) 40 MG tablet Take 1 tablet (40 mg total) by mouth daily. 90 tablet 0  . isosorbide-hydrALAZINE (BIDIL) 20-37.5 MG tablet Take 0.5 tablets by mouth 3 (three) times daily. 90 tablet 0  . diclofenac sodium (VOLTAREN) 1 % GEL Apply 2 g topically 4 (four) times daily. (Patient not taking: Reported on 02/09/2017) 1 Tube 1  . oxyCODONE-acetaminophen (PERCOCET/ROXICET) 5-325 MG tablet Take 1 tablet by mouth every 6 (six) hours as needed for moderate pain. (Patient not taking: Reported on 12/14/2016) 90 tablet 0  . rosuvastatin (CRESTOR) 20 MG tablet Take 1 tablet (20 mg total) by mouth daily. 90 tablet 3   No facility-administered medications prior to visit.      Allergies:   Lactose intolerance (gi)   Social History   Social History  . Marital status: Married    Spouse name: [name]  . Number of children: 1  . Years of education: N/A   Occupational History  .  [employer]   Social History Main Topics  . Smoking status: Never Smoker  . Smokeless tobacco: Never Used  . Alcohol use Yes     Comment: 02/13/12 "socially"  . Drug use: No  . Sexual activity: Not Currently   Other Topics Concern  . 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 He is married.. He has one son.     Family History:  The patient's family history includes Diabetes in his mother; Heart disease in his father and mother; Heart failure in his father and mother; Hypertension in his father and mother.   ROS:   Please see the history of present illness.    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 Rehabilitation is going well. Cognitive function is back to normal.  All other systems reviewed and are negative.   PHYSICAL EXAM:   VS:  BP 130/74 (BP Location: Left Arm)   Pulse 62   Ht 5\' 9"  (1.753 m)   Wt 234 lb (106.1 kg)   BMI 34.56 kg/m    GEN: Well nourished, well developed, in no acute distress  HEENT: normal  Neck: no JVD, carotid bruits, or masses Cardiac: RRR, rubs, or gallops,no edema . There is a 2 to 3/6 decrescendo early diastolic murmur of aortic regurgitation. Respiratory:  clear to auscultation bilaterally, normal work of breathing GI: soft, nontender, nondistended, + BS MS: no deformity. There is significant atrophy of the right quadriceps muscle.  Skin: warm and dry, no rash Neuro:  Alert and Oriented x 3, Strength and sensation are intact Psych: euthymic mood, full affect  Wt Readings from Last 3 Encounters:  02/09/17 234 lb (106.1 kg)  12/14/16 236 lb 12.4 oz (107.4 kg)  08/07/16 221 lb (100.2 kg)      Studies/Labs Reviewed:   EKG:  EKG  Normal sinus rhythm with ventricular pacing with interventricular conduction delay and QRS duration of 178 ms.  Recent Labs: 02/18/2016: Magnesium 2.0 03/20/2016: Hemoglobin 10.0; Platelets 293 09/08/2016: Brain Natriuretic Peptide 384.6; BUN 27; Creat 1.95; Potassium 4.4; Sodium 141; TSH 3.51 12/27/2016: ALT 18   Lipid Panel    Component Value Date/Time   CHOL 163 12/27/2016 0848   TRIG 72 12/27/2016 0848   HDL 64 12/27/2016 0848   CHOLHDL 2.5 12/27/2016 0848   CHOLHDL 3.8 02/13/2012 1829   VLDL 30 02/13/2012 1829   LDLCALC 85 12/27/2016 0848    Additional studies/ records that were reviewed today include:  NO FUNCTIONAL DATA. Last laboratory data performed by Dr. Bryon Lions revealed a creatinine of 1.82; TSH was normal; liver panel was unremarkable.   Last assessment of LV function was May 2017: Echocardiogram Study Conclusions  - Left ventricle: The cavity size was severely  dilated. There was   mild focal basal hypertrophy of the septum. Systolic function was   moderately to severely reduced. The estimated ejection fraction   was in the range of 30% to 35%. Diffuse hypokinesis. Features are   consistent with a pseudonormal left ventricular filling pattern,   with concomitant abnormal relaxation and increased filling   pressure (grade 2 diastolic dysfunction). Doppler parameters are   consistent with elevated ventricular end-diastolic filling   pressure. - Ventricular septum: Septal motion showed abnormal function and   dyssynergy. - Aortic valve: There was severe regurgitation. - Aortic root: The aortic root was dilated measurinh 49 mm. - Ascending aorta: The ascending aorta was dilated measuring 43 mm. - Left atrium: The atrium was moderately dilated. - Right ventricle: The cavity size was normal. Wall thickness was   normal. Systolic function was moderately reduced. - Right atrium: The atrium was normal in size. - Tricuspid valve: There was mild regurgitation. - Pulmonic valve: There was moderate regurgitation. - Pulmonary arteries: Systolic pressure was within the normal   range. PA peak pressure: 32 mm Hg (S). - Inferior vena cava: The vessel was normal in size. - Pericardium, extracardiac: There was no pericardial effusion.   There was a left pleural effusion.  Impressions:  - Dilated aortc root, with bicuspid aortic valve and severe aortic   regurgitation with eccentric jet. Mildly dilated ascending aorta.    ASSESSMENT:    1. Chronic systolic heart failure ([place])   2. Nonrheumatic aortic valve insufficiency   3. Hypertension, accelerated   4. Long term current use of amiodarone   5. Thoracic aortic aneurysm without rupture ([place])   6. Left bundle branch block   7. Paroxysmal atrial fibrillation (H[facility])   8. Chronic anticoagulation   9. Pure hypercholesterolemia   10. Cardiac arrest ([place])   11. Diabetes mellitus type 2 in nonobese (HCC)    12. CKD (chronic kidney disease), stage IV (H[facility])      PLAN:  In order of problems listed above:  1. There is no clinical evidence of volume overload. With resynchronization at home LV function is improved. We should consider repeating an echocardiogram perhaps after the next visit in 6 months. 2. Clinically, aortic regurgitation is mild. This will be monitored over time. 3. Blood pressure control is adequate currently. 4. Amiodarone therapy to suppress atrial fibrillation/atrial flutter at  200 mg per day. Relatively recent TSH and liver panel was unremarkable. He needs TSH and hepatic panel every 6 months. Chest x-ray once per year. 5. We will serially follow thoracic aortic diameter over time. Next assessment will be when we perform echocardiography in 6 months or so. 6. Not addressed but being managed with resynchronization therapy 7. Suppressed with amiodarone. 8. No bleeding on anticoagulation. Cautioned to monitor for evidence of bleeding. 9. Last LDL cholesterol was less than 80. 10. Not addressed. rEcovering systemically from neurologic, pulmonary, and musculoskeletal injury. 11. A1c target less than 70 12. Followed closely by primary care. 13.     Medication Adjustments/Labs and Tests Ordered: Current medicines are reviewed at length with the patient today.  Concerns regarding medicines are outlined above.  Medication changes, Labs and Tests ordered today are listed in the Patient Instructions below. Patient Instructions  Medication Instructions:  None  Labwork: None  Testing/Procedures: None  Follow-Up: Your physician wants you to follow-up in: 6 months with Dr. Tamala Julian.  You will receive a reminder letter in the mail two months in advance. If you don't receive a letter, please call our office to schedule the follow-up appointment.   Any Other Special Instructions Will Be Listed Below (If Applicable).     If you need a refill on your cardiac medications before  your next appointment, please call your pharmacy.      Signed, Sinclair Grooms, MD  02/09/2017 10:55 AM    [facility] [place] [address], [place], [place]  [address] Phone: [phone]; Fax: [phone]

## 2017-02-09 ENCOUNTER — Encounter: Payer: Self-pay | Admitting: Interventional Cardiology

## 2017-02-09 ENCOUNTER — Ambulatory Visit (INDEPENDENT_AMBULATORY_CARE_PROVIDER_SITE_OTHER): Payer: BC Managed Care – PPO | Admitting: Interventional Cardiology

## 2017-02-09 ENCOUNTER — Encounter (HOSPITAL_COMMUNITY)
Admission: RE | Admit: 2017-02-09 | Discharge: 2017-02-09 | Disposition: A | Discharge status: Home / Self Care | Payer: BC Managed Care – PPO | Source: Ambulatory Visit | Attending: Interventional Cardiology | Admitting: Interventional Cardiology

## 2017-02-09 VITALS — BP 130/74 | HR 62 | Ht 69.0 in | Wt 234.0 lb

## 2017-02-09 DIAGNOSIS — E7800 Pure hypercholesterolemia, unspecified: Secondary | ICD-10-CM

## 2017-02-09 DIAGNOSIS — I712 Thoracic aortic aneurysm, without rupture, unspecified: Secondary | ICD-10-CM

## 2017-02-09 DIAGNOSIS — I5022 Chronic systolic (congestive) heart failure: Secondary | ICD-10-CM

## 2017-02-09 DIAGNOSIS — Z79899 Other long term (current) drug therapy: Secondary | ICD-10-CM

## 2017-02-09 DIAGNOSIS — I10 Essential (primary) hypertension: Secondary | ICD-10-CM

## 2017-02-09 DIAGNOSIS — Z7901 Long term (current) use of anticoagulants: Secondary | ICD-10-CM

## 2017-02-09 DIAGNOSIS — I469 Cardiac arrest, cause unspecified: Secondary | ICD-10-CM | POA: Diagnosis not present

## 2017-02-09 DIAGNOSIS — I480 Paroxysmal atrial fibrillation: Secondary | ICD-10-CM

## 2017-02-09 DIAGNOSIS — I351 Nonrheumatic aortic (valve) insufficiency: Secondary | ICD-10-CM

## 2017-02-09 DIAGNOSIS — N184 Chronic kidney disease, stage 4 (severe): Secondary | ICD-10-CM

## 2017-02-09 DIAGNOSIS — I447 Left bundle-branch block, unspecified: Secondary | ICD-10-CM

## 2017-02-09 DIAGNOSIS — E119 Type 2 diabetes mellitus without complications: Secondary | ICD-10-CM

## 2017-02-09 MED ORDER — CARVEDILOL 25 MG PO TABS: 25.0000 mg | ORAL_TABLET | Freq: Two times a day (BID) | ORAL | 11 refills | Status: DC

## 2017-02-09 MED ORDER — FUROSEMIDE 40 MG PO TABS: 40.0000 mg | ORAL_TABLET | Freq: Every day | ORAL | 11 refills | Status: DC

## 2017-02-09 MED ORDER — ISOSORB DINITRATE-HYDRALAZINE 20-37.5 MG PO TABS: 0.5000 | ORAL_TABLET | Freq: Three times a day (TID) | ORAL | 11 refills | Status: DC

## 2017-02-09 MED ORDER — AMIODARONE HCL 200 MG PO TABS: 200.0000 mg | ORAL_TABLET | Freq: Every day | ORAL | 11 refills | Status: DC

## 2017-02-09 MED ORDER — APIXABAN 5 MG PO TABS: 5.0000 mg | ORAL_TABLET | Freq: Two times a day (BID) | ORAL | 11 refills | Status: DC

## 2017-02-09 MED ORDER — ROSUVASTATIN CALCIUM 20 MG PO TABS: 20.0000 mg | ORAL_TABLET | Freq: Every day | ORAL | 11 refills | Status: DC

## 2017-02-09 NOTE — Patient Instructions (Signed)
Medication Instructions:  None  Labwork: None  Testing/Procedures: None  Follow-Up: Your physician wants you to follow-up in: 6 months with Dr. Smith.  You will receive a reminder letter in the mail two months in advance. If you don't receive a letter, please call our office to schedule the follow-up appointment.   Any Other Special Instructions Will Be Listed Below (If Applicable).     If you need a refill on your cardiac medications before your next appointment, please call your pharmacy.   

## 2017-02-12 ENCOUNTER — Encounter (HOSPITAL_COMMUNITY)
Admission: RE | Admit: 2017-02-12 | Discharge: 2017-02-12 | Disposition: A | Discharge status: Home / Self Care | Payer: BC Managed Care – PPO | Source: Ambulatory Visit | Attending: Interventional Cardiology | Admitting: Interventional Cardiology

## 2017-02-12 DIAGNOSIS — I5022 Chronic systolic (congestive) heart failure: Secondary | ICD-10-CM

## 2017-02-13 NOTE — Progress Notes (Signed)
Cardiac Individual Treatment Plan  Patient Details  Name: Eric Hardenbrook  MD MRN: [id number] Date of Birth: 1950/12/14 Referring Provider:     [facility] from 12/14/2016 in [place]  Referring Provider  Daneen Schick MD      Initial Encounter Date:    CARDIAC REHAB PHASE II ORIENTATION from 12/14/2016 in [place]  Date  12/14/16  Referring Provider  Daneen Schick MD      Visit Diagnosis: Heart failure, chronic systolic ([place])  Patient's Home Medications on Admission:  Current Outpatient Prescriptions:  .  acetaminophen (TYLENOL) 500 MG tablet, Take 500 mg by mouth every 6 (six) hours as needed., Disp: , Rfl:  .  amiodarone (PACERONE) 200 MG tablet, Take 1 tablet (200 mg total) by mouth daily., Disp: 30 tablet, Rfl: 11 .  apixaban (ELIQUIS) 5 MG TABS tablet, Take 1 tablet (5 mg total) by mouth 2 (two) times daily., Disp: 60 tablet, Rfl: 11 .  carvedilol (COREG) 25 MG tablet, Take 1 tablet (25 mg total) by mouth 2 (two) times daily., Disp: 60 tablet, Rfl: 11 .  cholecalciferol (VITAMIN D) 1000 units tablet, Take 1,000 Units by mouth daily., Disp: , Rfl:  .  diclofenac sodium (VOLTAREN) 1 % GEL, Apply 2 g topically 4 (four) times daily as needed (elbow pain)., Disp: , Rfl:  .  docusate sodium (COLACE) 100 MG capsule, Take 100 mg by mouth daily as needed for mild constipation., Disp: , Rfl:  .  fluticasone (FLONASE) 50 MCG/ACT nasal spray, Place 2 sprays into the nose as needed for allergies. , Disp: , Rfl:  .  furosemide (LASIX) 40 MG tablet, Take 1 tablet (40 mg total) by mouth daily., Disp: 30 tablet, Rfl: 11 .  isosorbide-hydrALAZINE (BIDIL) 20-37.5 MG tablet, Take 0.5 tablets by mouth 3 (three) times daily., Disp: 45 tablet, Rfl: 11 .  levothyroxine (SYNTHROID, LEVOTHROID) 75 MCG tablet, Take 1 tablet (75 mcg total) by mouth daily before breakfast., Disp: 30 tablet, Rfl: 11 .  magnesium oxide  (MAG-OX) 400 MG tablet, Take 1 tablet (400 mg total) by mouth daily., Disp: 30 tablet, Rfl: 1 .  rosuvastatin (CRESTOR) 20 MG tablet, Take 1 tablet (20 mg total) by mouth daily., Disp: 30 tablet, Rfl: 11 .  vitamin B-12 (CYANOCOBALAMIN) 100 MCG tablet, Take 100 mcg by mouth daily as needed (energy). , Disp: , Rfl:   Past Medical History: Past Medical History:  Diagnosis Date  . CHF (congestive heart failure) ([facility])   . Chronic kidney disease    kidney fx studies increased   . Chronic lower back pain   . Claustrophobia   . Dysrhythmia    "palpitations"  . Exertional dyspnea 01/2012  . Heart murmur   . Hypertension   . Hypothyroidism   . Migraine 02/13/12   "opthalmic"  . Varicose vein of leg    right    Tobacco Use: History  Smoking Status  . Never Smoker  Smokeless Tobacco  . Never Used    Labs: Recent Review Flowsheet Data    Labs for ITP Cardiac and Pulmonary Rehab Latest Ref Rng & Units 02/01/2016 02/02/2016 02/03/2016 02/10/2016 12/27/2016   Cholestrol 100 - 199 mg/dL - - - - 163   LDLCALC 0 - 99 mg/dL - - - - 85   HDL >39 mg/dL - - - - 64   Trlycerides 0 - 149 mg/dL - - - - 72   Hemoglobin A1c  4.8 - 5.6 % - - - 7.1(H) -   PHART 7.350 - 7.450 - - 7.312(L) - -   PCO2ART 35.0 - 45.0 mmHg - - 37.3 - -   HCO3 20.0 - 24.0 mEq/L - - 18.9(L) - -   TCO2 0 - 100 mmol/L 18 21 20  - -   ACIDBASEDEF 0.0 - 2.0 mmol/L - - 7.0(H) - -   O2SAT % - - 96.0 - -      Capillary Blood Glucose: Lab Results  Component Value Date   GLUCAP 109 (H) 03/06/2016   GLUCAP 100 (H) 03/06/2016   GLUCAP 116 (H) 03/05/2016   GLUCAP 126 (H) 03/05/2016   GLUCAP 107 (H) 03/05/2016     Exercise Target Goals:    Exercise Program Goal: Individual exercise prescription set with THRR, safety & activity barriers. Participant demonstrates ability to understand and report RPE using BORG scale, to self-measure pulse accurately, and to acknowledge the importance of the exercise prescription.  Exercise  Prescription Goal: Starting with aerobic activity 30 plus minutes a day, 3 days per week for initial exercise prescription. Provide home exercise prescription and guidelines that participant acknowledges understanding prior to discharge.  Activity Barriers & Risk Stratification:     Activity Barriers & Cardiac Risk Stratification - 12/14/16 1435      Activity Barriers & Cardiac Risk Stratification   Activity Barriers Deconditioning;Assistive Device;Back Problems;Other (comment)   Comments R knee and quad pain,    Cardiac Risk Stratification High      6 Minute Walk:     6 Minute Walk    Row Name 12/14/16 1557         6 Minute Walk   Phase Initial     Distance 1337 feet     Walk Time 6 minutes     # of Rest Breaks 0     MPH 2.53     METS 3.1     RPE 11     VO2 Peak 10.76     Symptoms No     Resting HR 69 bpm     Resting BP 158/84     Max Ex. HR 108 bpm     Max Ex. BP 164/88     2 Minute Post BP 140/62        Oxygen Initial Assessment:   Oxygen Re-Evaluation:   Oxygen Discharge (Final Oxygen Re-Evaluation):   Initial Exercise Prescription:     Initial Exercise Prescription - 12/14/16 1500      Date of Initial Exercise RX and Referring Provider   Date 12/14/16   Referring Provider Daneen Schick MD     Treadmill   MPH 2   Grade 0   Minutes 10   METs 2.53     Recumbant Bike   Level 1.5   Minutes 10   METs 2.44     NuStep   Level 1   Minutes 10   METs 2.1     Prescription Details   Frequency (times per week) 5   Duration Progress to 45 minutes of aerobic exercise without signs/symptoms of physical distress     Intensity   THRR 40-80% of Max Heartrate 62-124   Ratings of Perceived Exertion 11-13   Perceived Dyspnea 0-4     Progression   Progression Continue to progress workloads to maintain intensity without signs/symptoms of physical distress.     Resistance Training   Training Prescription Yes   Weight 2   Reps 10-15  Perform  Capillary Blood Glucose checks as needed.  Exercise Prescription Changes:      Exercise Prescription Changes    Row Name 01/12/17 1400             Response to Exercise   Blood Pressure (Admit) 140/80       Blood Pressure (Exercise) 152/70       Blood Pressure (Exit) 122/70       Heart Rate (Admit) 66 bpm       Heart Rate (Exercise) 94 bpm       Heart Rate (Exit) 78 bpm       Rating of Perceived Exertion (Exercise) 13       Duration Progress to 45 minutes of aerobic exercise without signs/symptoms of physical distress       Intensity THRR unchanged         Progression   Progression Continue to progress workloads to maintain intensity without signs/symptoms of physical distress.       Average METs 2.8         Resistance Training   Training Prescription Yes       Weight 2       Reps 10-15         Treadmill   MPH 2.5       Grade 1       Minutes 10       METs 3.26         Recumbant Bike   Level 1.53       Minutes 10       METs 2.7         NuStep   Level 3       Minutes 10       METs 2.5         Home Exercise Plan   Plans to continue exercise at Home (comment)       Frequency Add 3 additional days to program exercise sessions.       Initial Home Exercises Provided 12/27/16          Exercise Comments:      Exercise Comments    Row Name 12/18/16 1635 01/12/17 1535 02/08/17 1539       Exercise Comments Off to a great start with exercise! Reviewed METs and goals with pt.  Pt is doing well with exercise. Reviewed METs and goals with pt.  Pt is doing well with exercise.        Exercise Goals and Review:      Exercise Goals    Row Name 12/14/16 1413             Exercise Goals   Increase Physical Activity Yes       Intervention Provide advice, education, support and counseling about physical activity/exercise needs.;Develop an individualized exercise prescription for aerobic and resistive training based on initial evaluation findings, risk  stratification, comorbidities and participant's personal goals.       Expected Outcomes Achievement of increased cardiorespiratory fitness and enhanced flexibility, muscular endurance and strength shown through measurements of functional capacity and personal statement of participant.       Increase Strength and Stamina Yes       Intervention Provide advice, education, support and counseling about physical activity/exercise needs.;Develop an individualized exercise prescription for aerobic and resistive training based on initial evaluation findings, risk stratification, comorbidities and participant's personal goals.       Expected Outcomes Achievement of increased cardiorespiratory fitness and enhanced flexibility, muscular endurance and  strength shown through measurements of functional capacity and personal statement of participant.          Exercise Goals Re-Evaluation :     Exercise Goals Re-Evaluation    Row Name 01/12/17 1533 02/08/17 1537           Exercise Goal Re-Evaluation   Exercise Goals Review Increase Physical Activity;Increase Strenth and Stamina Increase Physical Activity;Increase Strenth and Stamina      Comments Pt states he feels stronger and he has more energy.  He was able to do some yard work for the first time in a long time without feeling exhausted. Pt states he feels great and exercising is becoming much easier for him to tolerate.  He is able to walk further without being as tired and his strength in his legs have increased      Expected Outcomes Continue with exercise prescription and home exercise program in order to increase cardiorespiratory fitness.   Continue with exercise prescription and home exercise program in order to increase cardiorespiratory fitness.            Discharge Exercise Prescription (Final Exercise Prescription Changes):     Exercise Prescription Changes - 01/12/17 1400      Response to Exercise   Blood Pressure (Admit) 140/80   Blood  Pressure (Exercise) 152/70   Blood Pressure (Exit) 122/70   Heart Rate (Admit) 66 bpm   Heart Rate (Exercise) 94 bpm   Heart Rate (Exit) 78 bpm   Rating of Perceived Exertion (Exercise) 13   Duration Progress to 45 minutes of aerobic exercise without signs/symptoms of physical distress   Intensity THRR unchanged     Progression   Progression Continue to progress workloads to maintain intensity without signs/symptoms of physical distress.   Average METs 2.8     Resistance Training   Training Prescription Yes   Weight 2   Reps 10-15     Treadmill   MPH 2.5   Grade 1   Minutes 10   METs 3.26     Recumbant Bike   Level 1.53   Minutes 10   METs 2.7     NuStep   Level 3   Minutes 10   METs 2.5     Home Exercise Plan   Plans to continue exercise at Home (comment)   Frequency Add 3 additional days to program exercise sessions.   Initial Home Exercises Provided 12/27/16      Nutrition:  Target Goals: Understanding of nutrition guidelines, daily intake of sodium 1500mg , cholesterol 200mg , calories 30% from fat and 7% or less from saturated fats, daily to have 5 or more servings of fruits and vegetables.  Biometrics:     Pre Biometrics - 12/14/16 1604      Pre Biometrics   Height 5' 7.75" (1.721 m)   Weight 236 lb 12.4 oz (107.4 kg)   Waist Circumference 47 inches   Hip Circumference 50 inches   Waist to Hip Ratio 0.94 %   BMI (Calculated) 36.3   Triceps Skinfold 18 mm   % Body Fat 34.8 %   Grip Strength 34.5 kg   Flexibility 11.5 in   Single Leg Stand 14.85 seconds       Nutrition Therapy Plan and Nutrition Goals:     Nutrition Therapy & Goals - 12/15/16 0802      Nutrition Therapy   Diet Carb Modified, Therapeutic Lifestyle Changes     Personal Nutrition Goals   Nutrition Goal Wt loss of  1-2 lb/week to a wt loss goal of 6-24 lb at graduation from [facility].      Intervention Plan   Intervention Prescribe, educate and counsel regarding  individualized specific dietary modifications aiming towards targeted core components such as weight, hypertension, lipid management, diabetes, heart failure and other comorbidities.   Expected Outcomes Short Term Goal: Understand basic principles of dietary content, such as calories, fat, sodium, cholesterol and nutrients.;Long Term Goal: Adherence to prescribed nutrition plan.      Nutrition Discharge: Nutrition Scores:   Nutrition Goals Re-Evaluation:   Nutrition Goals Re-Evaluation:   Nutrition Goals Discharge (Final Nutrition Goals Re-Evaluation):   Psychosocial: Target Goals: Acknowledge presence or absence of significant depression and/or stress, maximize coping skills, provide positive support system. Participant is able to verbalize types and ability to use techniques and skills needed for reducing stress and depression.  Initial Review & Psychosocial Screening:     Initial Psych Review & Screening - 12/18/16 1635      Screening Interventions   Interventions Encouraged to exercise      Quality of Life Scores:     Quality of Life - 12/22/16 1610      Quality of Life Scores   Health/Function Pre 21.6 %   Socioeconomic Pre 25.64 %   Psych/Spiritual Pre 23.14 %   Family Pre 24 %   GLOBAL Pre 23.1 %      PHQ-9: Recent Review Flowsheet Data    Depression screen [facility] 2/9 12/18/2016   Decreased Interest 0   Down, Depressed, Hopeless 0   PHQ - 2 Score 0     Interpretation of Total Score  Total Score Depression Severity:  1-4 = Minimal depression, 5-9 = Mild depression, 10-14 = Moderate depression, 15-19 = Moderately severe depression, 20-27 = Severe depression   Psychosocial Evaluation and Intervention:   Psychosocial Re-Evaluation:     Psychosocial Re-Evaluation    [place] Name 12/20/16 1801 01/16/17 1540 02/13/17 1425         Psychosocial Re-Evaluation   Current issues with None Identified None Identified None Identified     Interventions Encouraged to  attend Cardiac Rehabilitation for the exercise Encouraged to attend Cardiac Rehabilitation for the exercise Encouraged to attend Cardiac Rehabilitation for the exercise     Continue Psychosocial Services  No Follow up required No Follow up required No Follow up required        Psychosocial Discharge (Final Psychosocial Re-Evaluation):     Psychosocial Re-Evaluation - 02/13/17 1425      Psychosocial Re-Evaluation   Current issues with None Identified   Interventions Encouraged to attend Cardiac Rehabilitation for the exercise   Continue Psychosocial Services  No Follow up required      Vocational Rehabilitation: Provide vocational rehab assistance to qualifying candidates.   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      Education: Education Goals: Education classes will be provided on a weekly basis, covering required topics. Participant will state understanding/return demonstration of topics presented.  Learning Barriers/Preferences:     Learning Barriers/Preferences - 12/14/16 1412      Learning Barriers/Preferences   Learning Barriers Sight  reading   Learning Preferences Skilled Demonstration;Written Material      Education Topics: Count Your Pulse:  -  Group instruction provided by verbal instruction, demonstration, patient participation and written materials to support subject.  Instructors address importance of being able to find your pulse and how to count your pulse when at home without a heart monitor.  Patients get hands on experience counting their pulse with staff help and individually.   Heart Attack, Angina, and Risk Factor Modification:  -Group instruction provided by verbal instruction, video, and written  materials to support subject.  Instructors address signs and symptoms of angina and heart attacks.    Also discuss risk factors for heart disease and how to make changes to improve heart health risk factors.   Functional Fitness:  -Group instruction provided by verbal instruction, demonstration, patient participation, and written materials to support subject.  Instructors address safety measures for doing things around the house.  Discuss how to get up and down off the floor, how to pick things up properly, how to safely get out of a chair without assistance, and balance training.   Meditation and Mindfulness:  -Group instruction provided by verbal instruction, patient participation, and written materials to support subject.  Instructor addresses importance of mindfulness and meditation practice to help reduce stress and improve awareness.  Instructor also leads participants through a meditation exercise.    CARDIAC REHAB PHASE II EXERCISE from 01/10/2017 in [place]  Date  01/10/17  Instruction Review Code  2- meets goals/outcomes      Stretching for Flexibility and Mobility:  -Group instruction provided by verbal instruction, patient participation, and written materials to support subject.  Instructors lead participants through series of stretches that are designed to increase flexibility thus improving mobility.  These stretches are additional exercise for major muscle groups that are typically performed during regular warm up and cool down.   Hands Only CPR:  -Group verbal, video, and participation provides a basic overview of AHA guidelines for community CPR. Role-play of emergencies allow participants the opportunity to practice calling for help and chest compression technique with discussion of AED use.   Hypertension: -Group verbal and written instruction that provides a basic overview of hypertension including the most recent diagnostic guidelines,  risk factor reduction with self-care instructions and medication management.    Nutrition I class: Heart Healthy Eating:  -Group instruction provided by PowerPoint slides, verbal discussion, and written materials to support subject matter. The instructor gives an explanation and review of the Therapeutic Lifestyle Changes diet recommendations, which includes a discussion on lipid goals, dietary fat, sodium, fiber, plant stanol/sterol esters, sugar, and the components of a well-balanced, healthy diet.   CARDIAC REHAB PHASE II EXERCISE from 01/10/2017 in [place]  Date  12/25/16  Educator  RD  Instruction Review Code  Not applicable [class handouts given]      Nutrition II class: Lifestyle Skills:  -Group instruction provided by PowerPoint slides, verbal discussion, and written materials to support subject matter. The instructor gives an explanation and review of label reading, grocery shopping for heart health, heart healthy recipe modifications, and ways to make healthier choices when eating out.   CARDIAC REHAB PHASE II EXERCISE from 01/10/2017 in [place]  Date  12/25/16  Educator  RD  Instruction Review Code  Not applicable [class handouts given]      Diabetes Question & Answer:  -Group instruction provided by PowerPoint slides, verbal discussion, and written materials to support subject matter. The instructor gives an explanation and review of diabetes co-morbidities, pre-  and post-prandial blood glucose goals, pre-exercise blood glucose goals, signs, symptoms, and treatment of hypoglycemia and hyperglycemia, and foot care basics.   Diabetes Blitz:  -Group instruction provided by PowerPoint slides, verbal discussion, and written materials to support subject matter. The instructor gives an explanation and review of the physiology behind type 1 and type 2 diabetes, diabetes medications and rational behind using different  medications, pre- and post-prandial blood glucose recommendations and Hemoglobin A1c goals, diabetes diet, and exercise including blood glucose guidelines for exercising safely.    CARDIAC REHAB PHASE II EXERCISE from 01/10/2017 in [place]  Date  12/25/16  Educator  RD  Instruction Review Code  Not applicable [class handouts given]      Portion Distortion:  -Group instruction provided by PowerPoint slides, verbal discussion, written materials, and food models to support subject matter. The instructor gives an explanation of serving size versus portion size, changes in portions sizes over the last 20 years, and what consists of a serving from each food group.   Stress Management:  -Group instruction provided by verbal instruction, video, and written materials to support subject matter.  Instructors review role of stress in heart disease and how to cope with stress positively.     Exercising on Your Own:  -Group instruction provided by verbal instruction, power point, and written materials to support subject.  Instructors discuss benefits of exercise, components of exercise, frequency and intensity of exercise, and end points for exercise.  Also discuss use of nitroglycerin and activating EMS.  Review options of places to exercise outside of rehab.  Review guidelines for sex with heart disease.   Cardiac Drugs I:  -Group instruction provided by verbal instruction and written materials to support subject.  Instructor reviews cardiac drug classes: antiplatelets, anticoagulants, beta blockers, and statins.  Instructor discusses reasons, side effects, and lifestyle considerations for each drug class.   Cardiac Drugs II:  -Group instruction provided by verbal instruction and written materials to support subject.  Instructor reviews cardiac drug classes: angiotensin converting enzyme inhibitors (ACE-I), angiotensin II receptor blockers (ARBs), nitrates, and calcium  channel blockers.  Instructor discusses reasons, side effects, and lifestyle considerations for each drug class.   Anatomy and Physiology of the Circulatory System:  Group verbal and written instruction and models provide basic cardiac anatomy and physiology, with the coronary electrical and arterial systems. Review of: AMI, Angina, Valve disease, Heart Failure, Peripheral Artery Disease, Cardiac Arrhythmia, Pacemakers, and the ICD.   Other Education:  -Group or individual verbal, written, or video instructions that support the educational goals of the cardiac rehab program.   Knowledge Questionnaire Score:   Core Components/Risk Factors/Patient Goals at Admission:     Personal Goals and Risk Factors at Admission - 12/14/16 1413      Core Components/Risk Factors/Patient Goals on Admission    Weight Management Yes;Weight Loss   Intervention Weight Management: Develop a combined nutrition and exercise program designed to reach desired caloric intake, while maintaining appropriate intake of nutrient and fiber, sodium and fats, and appropriate energy expenditure required for the weight goal.;Weight Management: Provide education and appropriate resources to help participant work on and attain dietary goals.;Weight Management/Obesity: Establish reasonable short term and long term weight goals.   Expected Outcomes Short Term: Continue to assess and modify interventions until short term weight is achieved;Long Term: Adherence to nutrition and physical activity/exercise program aimed toward attainment of established weight goal;Weight Maintenance: Understanding of the daily nutrition guidelines, which includes 25-35% calories  from fat, 7% or less cal from saturated fats, less than 200mg  cholesterol, less than 1.5gm of sodium, & 5 or more servings of fruits and vegetables daily;Weight Loss: Understanding of general recommendations for a balanced deficit meal plan, which promotes 1-2 lb weight loss per  week and includes a negative energy balance of [phone] kcal/d;Understanding recommendations for meals to include 15-35% energy as protein, 25-35% energy from fat, 35-60% energy from carbohydrates, less than 200mg  of dietary cholesterol, 20-35 gm of total fiber daily;Understanding of distribution of calorie intake throughout the day with the consumption of 4-5 meals/snacks   Heart Failure Yes   Intervention Provide a combined exercise and nutrition program that is supplemented with education, support and counseling about heart failure. Directed toward relieving symptoms such as shortness of breath, decreased exercise tolerance, and extremity edema.   Expected Outcomes Improve functional capacity of life;Short term: Attendance in program 2-3 days a week with increased exercise capacity. Reported lower sodium intake. Reported increased fruit and vegetable intake. Reports medication compliance.;Short term: Daily weights obtained and reported for increase. Utilizing diuretic protocols set by physician.;Long term: Adoption of self-care skills and reduction of barriers for early signs and symptoms recognition and intervention leading to self-care maintenance.   Hypertension Yes   Intervention Provide education on lifestyle modifcations including regular physical activity/exercise, weight management, moderate sodium restriction and increased consumption of fresh fruit, vegetables, and low fat dairy, alcohol moderation, and smoking cessation.;Monitor prescription use compliance.   Expected Outcomes Short Term: Continued assessment and intervention until BP is < 140/27mm HG in hypertensive participants. < 130/62mm HG in hypertensive participants with diabetes, heart failure or chronic kidney disease.;Long Term: Maintenance of blood pressure at goal levels.   Lipids Yes   Intervention Provide education and support for participant on nutrition & aerobic/resistive exercise along with prescribed medications to achieve  LDL 70mg , HDL >40mg .   Expected Outcomes Short Term: Participant states understanding of desired cholesterol values and is compliant with medications prescribed. Participant is following exercise prescription and nutrition guidelines.;Long Term: Cholesterol controlled with medications as prescribed, with individualized exercise RX and with personalized nutrition plan. Value goals: LDL < 70mg , HDL > 40 mg.   Stress Yes   Intervention Offer individual and/or small group education and counseling on adjustment to heart disease, stress management and health-related lifestyle change. Teach and support self-help strategies.;Refer participants experiencing significant psychosocial distress to appropriate mental health specialists for further evaluation and treatment. When possible, include family members and significant others in education/counseling sessions.   Expected Outcomes Short Term: Participant demonstrates changes in health-related behavior, relaxation and other stress management skills, ability to obtain effective social support, and compliance with psychotropic medications if prescribed.;Long Term: Emotional wellbeing is indicated by absence of clinically significant psychosocial distress or social isolation.   Personal Goal Other Yes   Personal Goal Be able to walk up and down the hill, negogiate stairs without difficulty. Lose weight in the belly region goal weight ~200lbs   Intervention Provide exercise programming to assist with improve functional mobility and cardiorespiratory fitness.    Expected Outcomes Pt will lose weight, gain muscle mass and be able to negogiate stairs and walk up/downhill.      Core Components/Risk Factors/Patient Goals Review:    Core Components/Risk Factors/Patient Goals at Discharge (Final Review):    ITP Comments:     ITP Comments    Row Name 12/14/16 1410           ITP Comments Dr. 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 Recommend continued exercise and life style modification education including  stress management and relaxation techniques to decrease cardiac risk profile. 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

## 2017-02-14 ENCOUNTER — Encounter (HOSPITAL_COMMUNITY): Payer: Self-pay

## 2017-02-16 ENCOUNTER — Encounter (HOSPITAL_COMMUNITY)
Admission: RE | Admit: 2017-02-16 | Discharge: 2017-02-16 | Disposition: A | Discharge status: Home / Self Care | Payer: BC Managed Care – PPO | Source: Ambulatory Visit | Attending: Interventional Cardiology | Admitting: Interventional Cardiology

## 2017-02-16 DIAGNOSIS — I5022 Chronic systolic (congestive) heart failure: Secondary | ICD-10-CM

## 2017-02-21 ENCOUNTER — Encounter (HOSPITAL_COMMUNITY): Payer: Self-pay

## 2017-02-23 ENCOUNTER — Encounter (HOSPITAL_COMMUNITY): Payer: Self-pay

## 2017-02-24 DIAGNOSIS — E1122 Type 2 diabetes mellitus with diabetic chronic kidney disease: Secondary | ICD-10-CM | POA: Diagnosis not present

## 2017-02-26 ENCOUNTER — Encounter (HOSPITAL_COMMUNITY)
Admission: RE | Admit: 2017-02-26 | Discharge: 2017-02-26 | Disposition: A | Discharge status: Home / Self Care | Payer: BC Managed Care – PPO | Source: Ambulatory Visit | Attending: Interventional Cardiology | Admitting: Interventional Cardiology

## 2017-02-26 DIAGNOSIS — I469 Cardiac arrest, cause unspecified: Secondary | ICD-10-CM | POA: Insufficient documentation

## 2017-02-26 DIAGNOSIS — I5022 Chronic systolic (congestive) heart failure: Secondary | ICD-10-CM

## 2017-02-26 DIAGNOSIS — I5023 Acute on chronic systolic (congestive) heart failure: Secondary | ICD-10-CM | POA: Insufficient documentation

## 2017-02-28 ENCOUNTER — Encounter (HOSPITAL_COMMUNITY): Payer: Self-pay

## 2017-03-02 ENCOUNTER — Encounter (HOSPITAL_COMMUNITY)
Admission: RE | Admit: 2017-03-02 | Discharge: 2017-03-02 | Disposition: A | Discharge status: Home / Self Care | Payer: BC Managed Care – PPO | Source: Ambulatory Visit | Attending: Interventional Cardiology | Admitting: Interventional Cardiology

## 2017-03-02 DIAGNOSIS — I5022 Chronic systolic (congestive) heart failure: Secondary | ICD-10-CM

## 2017-03-05 ENCOUNTER — Encounter (HOSPITAL_COMMUNITY): Payer: Self-pay

## 2017-03-07 ENCOUNTER — Encounter (HOSPITAL_COMMUNITY): Payer: Self-pay

## 2017-03-09 ENCOUNTER — Encounter (HOSPITAL_COMMUNITY)
Admission: RE | Admit: 2017-03-09 | Discharge: 2017-03-09 | Disposition: A | Discharge status: Home / Self Care | Payer: BC Managed Care – PPO | Source: Ambulatory Visit | Attending: Interventional Cardiology | Admitting: Interventional Cardiology

## 2017-03-09 VITALS — Wt 238.8 lb

## 2017-03-09 DIAGNOSIS — I5022 Chronic systolic (congestive) heart failure: Secondary | ICD-10-CM

## 2017-03-12 ENCOUNTER — Encounter (HOSPITAL_COMMUNITY): Payer: Self-pay

## 2017-03-14 ENCOUNTER — Encounter (HOSPITAL_COMMUNITY): Payer: Self-pay | Admitting: *Deleted

## 2017-03-14 ENCOUNTER — Encounter (HOSPITAL_COMMUNITY): Payer: Self-pay

## 2017-03-14 DIAGNOSIS — I5022 Chronic systolic (congestive) heart failure: Secondary | ICD-10-CM

## 2017-03-14 NOTE — Progress Notes (Signed)
Cardiac Individual Treatment Plan  Patient Details  Name: Kastiel Simonian  MD MRN: [id number] Date of Birth: 01-Nov-1950 Referring Provider:     [facility] from 12/14/2016 in [place]  Referring Provider  Daneen Schick MD      Initial Encounter Date:    CARDIAC REHAB PHASE II ORIENTATION from 12/14/2016 in [place]  Date  12/14/16  Referring Provider  Daneen Schick MD      Visit Diagnosis: Heart failure, chronic systolic ([place])  Patient's Home Medications on Admission:  Current Outpatient Prescriptions:  .  acetaminophen (TYLENOL) 500 MG tablet, Take 500 mg by mouth every 6 (six) hours as needed., Disp: , Rfl:  .  amiodarone (PACERONE) 200 MG tablet, Take 1 tablet (200 mg total) by mouth daily., Disp: 30 tablet, Rfl: 11 .  apixaban (ELIQUIS) 5 MG TABS tablet, Take 1 tablet (5 mg total) by mouth 2 (two) times daily., Disp: 60 tablet, Rfl: 11 .  carvedilol (COREG) 25 MG tablet, Take 1 tablet (25 mg total) by mouth 2 (two) times daily., Disp: 60 tablet, Rfl: 11 .  cholecalciferol (VITAMIN D) 1000 units tablet, Take 1,000 Units by mouth daily., Disp: , Rfl:  .  diclofenac sodium (VOLTAREN) 1 % GEL, Apply 2 g topically 4 (four) times daily as needed (elbow pain)., Disp: , Rfl:  .  docusate sodium (COLACE) 100 MG capsule, Take 100 mg by mouth daily as needed for mild constipation., Disp: , Rfl:  .  fluticasone (FLONASE) 50 MCG/ACT nasal spray, Place 2 sprays into the nose as needed for allergies. , Disp: , Rfl:  .  furosemide (LASIX) 40 MG tablet, Take 1 tablet (40 mg total) by mouth daily., Disp: 30 tablet, Rfl: 11 .  isosorbide-hydrALAZINE (BIDIL) 20-37.5 MG tablet, Take 0.5 tablets by mouth 3 (three) times daily., Disp: 45 tablet, Rfl: 11 .  levothyroxine (SYNTHROID, LEVOTHROID) 75 MCG tablet, Take 1 tablet (75 mcg total) by mouth daily before breakfast., Disp: 30 tablet, Rfl: 11 .  magnesium oxide  (MAG-OX) 400 MG tablet, Take 1 tablet (400 mg total) by mouth daily., Disp: 30 tablet, Rfl: 1 .  rosuvastatin (CRESTOR) 20 MG tablet, Take 1 tablet (20 mg total) by mouth daily., Disp: 30 tablet, Rfl: 11 .  vitamin B-12 (CYANOCOBALAMIN) 100 MCG tablet, Take 100 mcg by mouth daily as needed (energy). , Disp: , Rfl:   Past Medical History: Past Medical History:  Diagnosis Date  . CHF (congestive heart failure) ([place])   . Chronic kidney disease    kidney fx studies increased   . Chronic lower back pain   . Claustrophobia   . Dysrhythmia    "palpitations"  . Exertional dyspnea 01/2012  . Heart murmur   . Hypertension   . Hypothyroidism   . Migraine 02/13/12   "opthalmic"  . Varicose vein of leg    right    Tobacco Use: History  Smoking Status  . Never Smoker  Smokeless Tobacco  . Never Used    Labs: Recent Review Flowsheet Data    Labs for ITP Cardiac and Pulmonary Rehab Latest Ref Rng & Units 02/01/2016 02/02/2016 02/03/2016 02/10/2016 12/27/2016   Cholestrol 100 - 199 mg/dL - - - - 163   LDLCALC 0 - 99 mg/dL - - - - 85   HDL >39 mg/dL - - - - 64   Trlycerides 0 - 149 mg/dL - - - - 72   Hemoglobin A1c  4.8 - 5.6 % - - - 7.1(H) -   PHART 7.350 - 7.450 - - 7.312(L) - -   PCO2ART 35.0 - 45.0 mmHg - - 37.3 - -   HCO3 20.0 - 24.0 mEq/L - - 18.9(L) - -   TCO2 0 - 100 mmol/L 18 21 20  - -   ACIDBASEDEF 0.0 - 2.0 mmol/L - - 7.0(H) - -   O2SAT % - - 96.0 - -      Capillary Blood Glucose: Lab Results  Component Value Date   GLUCAP 109 (H) 03/06/2016   GLUCAP 100 (H) 03/06/2016   GLUCAP 116 (H) 03/05/2016   GLUCAP 126 (H) 03/05/2016   GLUCAP 107 (H) 03/05/2016     Exercise Target Goals:    Exercise Program Goal: Individual exercise prescription set with THRR, safety & activity barriers. Participant demonstrates ability to understand and report RPE using BORG scale, to self-measure pulse accurately, and to acknowledge the importance of the exercise prescription.  Exercise  Prescription Goal: Starting with aerobic activity 30 plus minutes a day, 3 days per week for initial exercise prescription. Provide home exercise prescription and guidelines that participant acknowledges understanding prior to discharge.  Activity Barriers & Risk Stratification:   6 Minute Walk:   Oxygen Initial Assessment:   Oxygen Re-Evaluation:   Oxygen Discharge (Final Oxygen Re-Evaluation):   Initial Exercise Prescription:   Perform Capillary Blood Glucose checks as needed.  Exercise Prescription Changes:      Exercise Prescription Changes    Row Name 03/14/17 1600             Response to Exercise   Blood Pressure (Admit) 122/70       Blood Pressure (Exercise) 132/82       Blood Pressure (Exit) 128/82       Heart Rate (Admit) 76 bpm       Heart Rate (Exercise) 94 bpm       Heart Rate (Exit) 73 bpm       Rating of Perceived Exertion (Exercise) 13       Duration Progress to 45 minutes of aerobic exercise without signs/symptoms of physical distress       Intensity THRR unchanged         Progression   Progression Continue to progress workloads to maintain intensity without signs/symptoms of physical distress.       Average METs 2.9         Resistance Training   Training Prescription Yes       Weight 4lb       Reps 10-15         Treadmill   MPH 2.5       Grade 1       Minutes 10       METs 3.26         Recumbant Bike   Level 4       Minutes 10       METs 3.26         NuStep   Level 5       Minutes 10       METs 2.6         Home Exercise Plan   Plans to continue exercise at Home (comment)       Frequency Add 3 additional days to program exercise sessions.       Initial Home Exercises Provided 12/27/16          Exercise Comments:      Exercise  Comments    Row Name 02/08/17 1539 03/14/17 1636         Exercise Comments Reviewed METs and goals with pt.  Pt is doing well with exercise. Pt continues to do well with exercise.           Exercise Goals and Review:   Exercise Goals Re-Evaluation :     Exercise Goals Re-Evaluation    Row Name 02/08/17 1537 03/14/17 1635           Exercise Goal Re-Evaluation   Exercise Goals Review Increase Physical Activity;Increase Strenth and Stamina  -      Comments Pt states he feels great and exercising is becoming much easier for him to tolerate.  He is able to walk further without being as tired and his strength in his legs have increased Pt continues to do well with exercise and he's doing well with workload increases.        Expected Outcomes Continue with exercise prescription and home exercise program in order to increase cardiorespiratory fitness.   Continue with exercise prescription and home exercise program in order to increase cardiorespiratory fitness.            Discharge Exercise Prescription (Final Exercise Prescription Changes):     Exercise Prescription Changes - 03/14/17 1600      Response to Exercise   Blood Pressure (Admit) 122/70   Blood Pressure (Exercise) 132/82   Blood Pressure (Exit) 128/82   Heart Rate (Admit) 76 bpm   Heart Rate (Exercise) 94 bpm   Heart Rate (Exit) 73 bpm   Rating of Perceived Exertion (Exercise) 13   Duration Progress to 45 minutes of aerobic exercise without signs/symptoms of physical distress   Intensity THRR unchanged     Progression   Progression Continue to progress workloads to maintain intensity without signs/symptoms of physical distress.   Average METs 2.9     Resistance Training   Training Prescription Yes   Weight 4lb   Reps 10-15     Treadmill   MPH 2.5   Grade 1   Minutes 10   METs 3.26     Recumbant Bike   Level 4   Minutes 10   METs 3.26     NuStep   Level 5   Minutes 10   METs 2.6     Home Exercise Plan   Plans to continue exercise at Home (comment)   Frequency Add 3 additional days to program exercise sessions.   Initial Home Exercises Provided 12/27/16      Nutrition:  Target  Goals: Understanding of nutrition guidelines, daily intake of sodium 1500mg , cholesterol 200mg , calories 30% from fat and 7% or less from saturated fats, daily to have 5 or more servings of fruits and vegetables.  Biometrics:    Nutrition Therapy Plan and Nutrition Goals:   Nutrition Discharge: Nutrition Scores:   Nutrition Goals Re-Evaluation:   Nutrition Goals Re-Evaluation:   Nutrition Goals Discharge (Final Nutrition Goals Re-Evaluation):   Psychosocial: Target Goals: Acknowledge presence or absence of significant depression and/or stress, maximize coping skills, provide positive support system. Participant is able to verbalize types and ability to use techniques and skills needed for reducing stress and depression.  Initial Review & Psychosocial Screening:   Quality of Life Scores:   PHQ-9: Recent Review Flowsheet Data    Depression screen [facility] 2/9 12/18/2016   Decreased Interest 0   Down, Depressed, Hopeless 0   PHQ - 2 Score 0  Interpretation of Total Score  Total Score Depression Severity:  1-4 = Minimal depression, 5-9 = Mild depression, 10-14 = Moderate depression, 15-19 = Moderately severe depression, 20-27 = Severe depression   Psychosocial Evaluation and Intervention:   Psychosocial Re-Evaluation:     Psychosocial Re-Evaluation    [place] Name 01/16/17 1540 02/13/17 1425 03/14/17 1808         Psychosocial Re-Evaluation   Current issues with None Identified None Identified None Identified     Interventions Encouraged to attend Cardiac Rehabilitation for the exercise Encouraged to attend Cardiac Rehabilitation for the exercise Encouraged to attend Cardiac Rehabilitation for the exercise     Continue Psychosocial Services  No Follow up required No Follow up required No Follow up required        Psychosocial Discharge (Final Psychosocial Re-Evaluation):     Psychosocial Re-Evaluation - 03/14/17 1808      Psychosocial Re-Evaluation   Current  issues with None Identified   Interventions Encouraged to attend Cardiac Rehabilitation for the exercise   Continue Psychosocial Services  No Follow up required      Vocational Rehabilitation: Provide vocational rehab assistance to qualifying candidates.   Vocational Rehab Evaluation & Intervention:   Education: Education Goals: Education classes will be provided on a weekly basis, covering required topics. Participant will state understanding/return demonstration of topics presented.  Learning Barriers/Preferences:   Education Topics: Count Your Pulse:  -Group instruction provided by verbal instruction, demonstration, patient participation and written materials to support subject.  Instructors address importance of being able to find your pulse and how to count your pulse when at home without a heart monitor.  Patients get hands on experience counting their pulse with staff help and individually.   Heart Attack, Angina, and Risk Factor Modification:  -Group instruction provided by verbal instruction, video, and written materials to support subject.  Instructors address signs and symptoms of angina and heart attacks.    Also discuss risk factors for heart disease and how to make changes to improve heart health risk factors.   Functional Fitness:  -Group instruction provided by verbal instruction, demonstration, patient participation, and written materials to support subject.  Instructors address safety measures for doing things around the house.  Discuss how to get up and down off the floor, how to pick things up properly, how to safely get out of a chair without assistance, and balance training.   Meditation and Mindfulness:  -Group instruction provided by verbal instruction, patient participation, and written materials to support subject.  Instructor addresses importance of mindfulness and meditation practice to help reduce stress and improve awareness.  Instructor also leads  participants through a meditation exercise.    CARDIAC REHAB PHASE II EXERCISE from 01/10/2017 in [place]  Date  01/10/17  Instruction Review Code  2- meets goals/outcomes      Stretching for Flexibility and Mobility:  -Group instruction provided by verbal instruction, patient participation, and written materials to support subject.  Instructors lead participants through series of stretches that are designed to increase flexibility thus improving mobility.  These stretches are additional exercise for major muscle groups that are typically performed during regular warm up and cool down.   Hands Only CPR:  -Group verbal, video, and participation provides a basic overview of AHA guidelines for community CPR. Role-play of emergencies allow participants the opportunity to practice calling for help and chest compression technique with discussion of AED use.   Hypertension: -Group verbal and written instruction that  provides a basic overview of hypertension including the most recent diagnostic guidelines, risk factor reduction with self-care instructions and medication management.    Nutrition I class: Heart Healthy Eating:  -Group instruction provided by PowerPoint slides, verbal discussion, and written materials to support subject matter. The instructor gives an explanation and review of the Therapeutic Lifestyle Changes diet recommendations, which includes a discussion on lipid goals, dietary fat, sodium, fiber, plant stanol/sterol esters, sugar, and the components of a well-balanced, healthy diet.   CARDIAC REHAB PHASE II EXERCISE from 01/10/2017 in [place]  Date  12/25/16  Educator  RD  Instruction Review Code  Not applicable [class handouts given]      Nutrition II class: Lifestyle Skills:  -Group instruction provided by PowerPoint slides, verbal discussion, and written materials to support subject matter. The instructor  gives an explanation and review of label reading, grocery shopping for heart health, heart healthy recipe modifications, and ways to make healthier choices when eating out.   CARDIAC REHAB PHASE II EXERCISE from 01/10/2017 in [place]  Date  12/25/16  Educator  RD  Instruction Review Code  Not applicable [class handouts given]      Diabetes Question & Answer:  -Group instruction provided by PowerPoint slides, verbal discussion, and written materials to support subject matter. The instructor gives an explanation and review of diabetes co-morbidities, pre- and post-prandial blood glucose goals, pre-exercise blood glucose goals, signs, symptoms, and treatment of hypoglycemia and hyperglycemia, and foot care basics.   Diabetes Blitz:  -Group instruction provided by PowerPoint slides, verbal discussion, and written materials to support subject matter. The instructor gives an explanation and review of the physiology behind type 1 and type 2 diabetes, diabetes medications and rational behind using different medications, pre- and post-prandial blood glucose recommendations and Hemoglobin A1c goals, diabetes diet, and exercise including blood glucose guidelines for exercising safely.    CARDIAC REHAB PHASE II EXERCISE from 01/10/2017 in [place]  Date  12/25/16  Educator  RD  Instruction Review Code  Not applicable [class handouts given]      Portion Distortion:  -Group instruction provided by PowerPoint slides, verbal discussion, written materials, and food models to support subject matter. The instructor gives an explanation of serving size versus portion size, changes in portions sizes over the last 20 years, and what consists of a serving from each food group.   Stress Management:  -Group instruction provided by verbal instruction, video, and written materials to support subject matter.  Instructors review role of stress in heart  disease and how to cope with stress positively.     Exercising on Your Own:  -Group instruction provided by verbal instruction, power point, and written materials to support subject.  Instructors discuss benefits of exercise, components of exercise, frequency and intensity of exercise, and end points for exercise.  Also discuss use of nitroglycerin and activating EMS.  Review options of places to exercise outside of rehab.  Review guidelines for sex with heart disease.   Cardiac Drugs I:  -Group instruction provided by verbal instruction and written materials to support subject.  Instructor reviews cardiac drug classes: antiplatelets, anticoagulants, beta blockers, and statins.  Instructor discusses reasons, side effects, and lifestyle considerations for each drug class.   Cardiac Drugs II:  -Group instruction provided by verbal instruction and written materials to support subject.  Instructor reviews cardiac drug classes: angiotensin converting enzyme inhibitors (ACE-I), angiotensin II receptor blockers (  ARBs), nitrates, and calcium channel blockers.  Instructor discusses reasons, side effects, and lifestyle considerations for each drug class.   Anatomy and Physiology of the Circulatory System:  Group verbal and written instruction and models provide basic cardiac anatomy and physiology, with the coronary electrical and arterial systems. Review of: AMI, Angina, Valve disease, Heart Failure, Peripheral Artery Disease, Cardiac Arrhythmia, Pacemakers, and the ICD.   Other Education:  -Group or individual verbal, written, or video instructions that support the educational goals of the cardiac rehab program.   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 Recommend continued exercise and life style modification education including  stress management and relaxation techniques to decrease cardiac risk profile. Dr Venetia Maxon continues to do well with exercise and weight loss.Avonte Sensabaugh, RN,BSN 03/14/2017 6:11 PM

## 2017-03-16 ENCOUNTER — Encounter (HOSPITAL_COMMUNITY)
Admission: RE | Admit: 2017-03-16 | Discharge: 2017-03-16 | Disposition: A | Discharge status: Home / Self Care | Payer: Self-pay | Source: Ambulatory Visit | Attending: Interventional Cardiology | Admitting: Interventional Cardiology

## 2017-03-19 ENCOUNTER — Encounter (HOSPITAL_COMMUNITY): Payer: Self-pay

## 2017-03-21 ENCOUNTER — Encounter (HOSPITAL_COMMUNITY): Payer: Self-pay

## 2017-03-22 ENCOUNTER — Telehealth (HOSPITAL_COMMUNITY): Payer: Self-pay | Admitting: *Deleted

## 2017-03-23 ENCOUNTER — Encounter (HOSPITAL_COMMUNITY): Payer: Self-pay

## 2017-03-26 ENCOUNTER — Encounter (HOSPITAL_COMMUNITY): Payer: BC Managed Care – PPO

## 2017-03-30 ENCOUNTER — Encounter (HOSPITAL_COMMUNITY): Payer: BC Managed Care – PPO

## 2017-04-02 ENCOUNTER — Encounter (HOSPITAL_COMMUNITY): Payer: BC Managed Care – PPO

## 2017-04-04 ENCOUNTER — Encounter (HOSPITAL_COMMUNITY): Payer: BC Managed Care – PPO

## 2017-04-05 ENCOUNTER — Telehealth: Payer: Self-pay | Admitting: Interventional Cardiology

## 2017-04-05 NOTE — Telephone Encounter (Signed)
New message       Pt request a callback from the nurse.  He would not tell me what he wanted

## 2017-04-05 NOTE — Telephone Encounter (Signed)
Spoke with pt and he states that he is unable to afford Eliquis at this time d/t insurance running out.  He states Eliquis is currently over $400.  Pt wanted to know if we had any discount cards he could use.  Obtained 2 weeks worth of samples and placed discount cards in bag for pt to try.  Pt appreciative for assistance and will come by tomorrow to pick up.

## 2017-04-06 ENCOUNTER — Encounter (HOSPITAL_COMMUNITY): Payer: BC Managed Care – PPO

## 2017-04-09 ENCOUNTER — Encounter (HOSPITAL_COMMUNITY): Payer: BC Managed Care – PPO

## 2017-04-09 ENCOUNTER — Encounter: Payer: BC Managed Care – PPO | Admitting: Internal Medicine

## 2017-04-10 NOTE — Progress Notes (Signed)
Cardiac Individual Treatment Plan  Patient Details  Name: Eric Dresch  MD MRN: [id number] Date of Birth: [birth year] Referring Provider:     [facility] from 12/14/2016 in [place]  Referring Provider  Daneen Schick MD      Initial Encounter Date:    CARDIAC REHAB PHASE II ORIENTATION from 12/14/2016 in [place]  Date  12/14/16  Referring Provider  Daneen Schick MD      Visit Diagnosis: Heart failure, chronic systolic ([place])  Patient's Home Medications on Admission:  Current Outpatient Prescriptions:  .  acetaminophen (TYLENOL) 500 MG tablet, Take 500 mg by mouth every 6 (six) hours as needed., Disp: , Rfl:  .  amiodarone (PACERONE) 200 MG tablet, Take 1 tablet (200 mg total) by mouth daily., Disp: 30 tablet, Rfl: 11 .  apixaban (ELIQUIS) 5 MG TABS tablet, Take 1 tablet (5 mg total) by mouth 2 (two) times daily., Disp: 60 tablet, Rfl: 11 .  carvedilol (COREG) 25 MG tablet, Take 1 tablet (25 mg total) by mouth 2 (two) times daily., Disp: 60 tablet, Rfl: 11 .  cholecalciferol (VITAMIN D) 1000 units tablet, Take 1,000 Units by mouth daily., Disp: , Rfl:  .  diclofenac sodium (VOLTAREN) 1 % GEL, Apply 2 g topically 4 (four) times daily as needed (elbow pain)., Disp: , Rfl:  .  docusate sodium (COLACE) 100 MG capsule, Take 100 mg by mouth daily as needed for mild constipation., Disp: , Rfl:  .  fluticasone (FLONASE) 50 MCG/ACT nasal spray, Place 2 sprays into the nose as needed for allergies. , Disp: , Rfl:  .  furosemide (LASIX) 40 MG tablet, Take 1 tablet (40 mg total) by mouth daily., Disp: 30 tablet, Rfl: 11 .  isosorbide-hydrALAZINE (BIDIL) 20-37.5 MG tablet, Take 0.5 tablets by mouth 3 (three) times daily., Disp: 45 tablet, Rfl: 11 .  levothyroxine (SYNTHROID, LEVOTHROID) 75 MCG tablet, Take 1 tablet (75 mcg total) by mouth daily before breakfast., Disp: 30 tablet, Rfl: 11 .  magnesium oxide  (MAG-OX) 400 MG tablet, Take 1 tablet (400 mg total) by mouth daily., Disp: 30 tablet, Rfl: 1 .  rosuvastatin (CRESTOR) 20 MG tablet, Take 1 tablet (20 mg total) by mouth daily., Disp: 30 tablet, Rfl: 11 .  vitamin B-12 (CYANOCOBALAMIN) 100 MCG tablet, Take 100 mcg by mouth daily as needed (energy). , Disp: , Rfl:   Past Medical History: Past Medical History:  Diagnosis Date  . CHF (congestive heart failure) ([place])   . Chronic kidney disease    kidney fx studies increased   . Chronic lower back pain   . Claustrophobia   . Dysrhythmia    "palpitations"  . Exertional dyspnea 01/2012  . Heart murmur   . Hypertension   . Hypothyroidism   . Migraine 02/13/12   "opthalmic"  . Varicose vein of leg    right    Tobacco Use: History  Smoking Status  . Never Smoker  Smokeless Tobacco  . Never Used    Labs: Recent Review Flowsheet Data    Labs for ITP Cardiac and Pulmonary Rehab Latest Ref Rng & Units 02/01/2016 02/02/2016 02/03/2016 02/10/2016 12/27/2016   Cholestrol 100 - 199 mg/dL - - - - 163   LDLCALC 0 - 99 mg/dL - - - - 85   HDL >39 mg/dL - - - - 64   Trlycerides 0 - 149 mg/dL - - - - 72   Hemoglobin A1c  4.8 - 5.6 % - - - 7.1(H) -   PHART 7.350 - 7.450 - - 7.312(L) - -   PCO2ART 35.0 - 45.0 mmHg - - 37.3 - -   HCO3 20.0 - 24.0 mEq/L - - 18.9(L) - -   TCO2 0 - 100 mmol/L 18 21 20  - -   ACIDBASEDEF 0.0 - 2.0 mmol/L - - 7.0(H) - -   O2SAT % - - 96.0 - -      Capillary Blood Glucose: Lab Results  Component Value Date   GLUCAP 109 (H) 03/06/2016   GLUCAP 100 (H) 03/06/2016   GLUCAP 116 (H) 03/05/2016   GLUCAP 126 (H) 03/05/2016   GLUCAP 107 (H) 03/05/2016     Exercise Target Goals:    Exercise Program Goal: Individual exercise prescription set with THRR, safety & activity barriers. Participant demonstrates ability to understand and report RPE using BORG scale, to self-measure pulse accurately, and to acknowledge the importance of the exercise prescription.  Exercise  Prescription Goal: Starting with aerobic activity 30 plus minutes a day, 3 days per week for initial exercise prescription. Provide home exercise prescription and guidelines that participant acknowledges understanding prior to discharge.  Activity Barriers & Risk Stratification:     Activity Barriers & Cardiac Risk Stratification - 12/14/16 1435      Activity Barriers & Cardiac Risk Stratification   Activity Barriers Deconditioning;Assistive Device;Back Problems;Other (comment)   Comments R knee and quad pain,    Cardiac Risk Stratification High      6 Minute Walk:     6 Minute Walk    Row Name 12/14/16 1557         6 Minute Walk   Phase Initial     Distance 1337 feet     Walk Time 6 minutes     # of Rest Breaks 0     MPH 2.53     METS 3.1     RPE 11     VO2 Peak 10.76     Symptoms No     Resting HR 69 bpm     Resting BP 158/84     Max Ex. HR 108 bpm     Max Ex. BP 164/88     2 Minute Post BP 140/62        Oxygen Initial Assessment:   Oxygen Re-Evaluation:   Oxygen Discharge (Final Oxygen Re-Evaluation):   Initial Exercise Prescription:     Initial Exercise Prescription - 12/14/16 1500      Date of Initial Exercise RX and Referring Provider   Date 12/14/16   Referring Provider Daneen Schick MD     Treadmill   MPH 2   Grade 0   Minutes 10   METs 2.53     Recumbant Bike   Level 1.5   Minutes 10   METs 2.44     NuStep   Level 1   Minutes 10   METs 2.1     Prescription Details   Frequency (times per week) 5   Duration Progress to 45 minutes of aerobic exercise without signs/symptoms of physical distress     Intensity   THRR 40-80% of Max Heartrate 62-124   Ratings of Perceived Exertion 11-13   Perceived Dyspnea 0-4     Progression   Progression Continue to progress workloads to maintain intensity without signs/symptoms of physical distress.     Resistance Training   Training Prescription Yes   Weight 2   Reps 10-15  Perform  Capillary Blood Glucose checks as needed.  Exercise Prescription Changes:     Exercise Prescription Changes    Row Name 01/12/17 1400 03/14/17 1600           Response to Exercise   Blood Pressure (Admit) 140/80 122/70      Blood Pressure (Exercise) 152/70 132/82      Blood Pressure (Exit) 122/70 128/82      Heart Rate (Admit) 66 bpm 76 bpm      Heart Rate (Exercise) 94 bpm 94 bpm      Heart Rate (Exit) 78 bpm 73 bpm      Rating of Perceived Exertion (Exercise) 13 13      Duration Progress to 45 minutes of aerobic exercise without signs/symptoms of physical distress Progress to 45 minutes of aerobic exercise without signs/symptoms of physical distress      Intensity THRR unchanged THRR unchanged        Progression   Progression Continue to progress workloads to maintain intensity without signs/symptoms of physical distress. Continue to progress workloads to maintain intensity without signs/symptoms of physical distress.      Average METs 2.8 2.9        Resistance Training   Training Prescription Yes Yes      Weight 2 4lb      Reps 10-15 10-15        Treadmill   MPH 2.5 2.5      Grade 1 1      Minutes 10 10      METs 3.26 3.26        Recumbant Bike   Level 1.53 4      Minutes 10 10      METs 2.7 3.26        NuStep   Level 3 5      Minutes 10 10      METs 2.5 2.6        Home Exercise Plan   Plans to continue exercise at Home (comment) Home (comment)      Frequency Add 3 additional days to program exercise sessions. Add 3 additional days to program exercise sessions.      Initial Home Exercises Provided 12/27/16 12/27/16         Exercise Comments:     Exercise Comments    Row Name 12/18/16 1635 01/12/17 1535 02/08/17 1539 03/14/17 1636 04/09/17 1203   Exercise Comments Off to a great start with exercise! Reviewed METs and goals with pt.  Pt is doing well with exercise. Reviewed METs and goals with pt.  Pt is doing well with exercise. Pt continues to do well with  exercise.  Pt has been absent from the program for several weeks due to work.      Exercise Goals and Review:     Exercise Goals    Row Name 12/14/16 1413             Exercise Goals   Increase Physical Activity Yes       Intervention Provide advice, education, support and counseling about physical activity/exercise needs.;Develop an individualized exercise prescription for aerobic and resistive training based on initial evaluation findings, risk stratification, comorbidities and participant's personal goals.       Expected Outcomes Achievement of increased cardiorespiratory fitness and enhanced flexibility, muscular endurance and strength shown through measurements of functional capacity and personal statement of participant.       Increase Strength and Stamina Yes       Intervention  Provide advice, education, support and counseling about physical activity/exercise needs.;Develop an individualized exercise prescription for aerobic and resistive training based on initial evaluation findings, risk stratification, comorbidities and participant's personal goals.       Expected Outcomes Achievement of increased cardiorespiratory fitness and enhanced flexibility, muscular endurance and strength shown through measurements of functional capacity and personal statement of participant.          Exercise Goals Re-Evaluation :     Exercise Goals Re-Evaluation    Row Name 01/12/17 1533 02/08/17 1537 03/14/17 1635         Exercise Goal Re-Evaluation   Exercise Goals Review Increase Physical Activity;Increase Strenth and Stamina Increase Physical Activity;Increase Strenth and Stamina  -     Comments Pt states he feels stronger and he has more energy.  He was able to do some yard work for the first time in a long time without feeling exhausted. Pt states he feels great and exercising is becoming much easier for him to tolerate.  He is able to walk further without being as tired and his strength in  his legs have increased Pt continues to do well with exercise and he's doing well with workload increases.       Expected Outcomes Continue with exercise prescription and home exercise program in order to increase cardiorespiratory fitness.   Continue with exercise prescription and home exercise program in order to increase cardiorespiratory fitness.   Continue with exercise prescription and home exercise program in order to increase cardiorespiratory fitness.           Discharge Exercise Prescription (Final Exercise Prescription Changes):     Exercise Prescription Changes - 03/14/17 1600      Response to Exercise   Blood Pressure (Admit) 122/70   Blood Pressure (Exercise) 132/82   Blood Pressure (Exit) 128/82   Heart Rate (Admit) 76 bpm   Heart Rate (Exercise) 94 bpm   Heart Rate (Exit) 73 bpm   Rating of Perceived Exertion (Exercise) 13   Duration Progress to 45 minutes of aerobic exercise without signs/symptoms of physical distress   Intensity THRR unchanged     Progression   Progression Continue to progress workloads to maintain intensity without signs/symptoms of physical distress.   Average METs 2.9     Resistance Training   Training Prescription Yes   Weight 4lb   Reps 10-15     Treadmill   MPH 2.5   Grade 1   Minutes 10   METs 3.26     Recumbant Bike   Level 4   Minutes 10   METs 3.26     NuStep   Level 5   Minutes 10   METs 2.6     Home Exercise Plan   Plans to continue exercise at Home (comment)   Frequency Add 3 additional days to program exercise sessions.   Initial Home Exercises Provided 12/27/16      Nutrition:  Target Goals: Understanding of nutrition guidelines, daily intake of sodium 1500mg , cholesterol 200mg , calories 30% from fat and 7% or less from saturated fats, daily to have 5 or more servings of fruits and vegetables.  Biometrics:     Pre Biometrics - 12/14/16 1604      Pre Biometrics   Height 5' 7.75" (1.721 m)   Weight 236  lb 12.4 oz (107.4 kg)   Waist Circumference 47 inches   Hip Circumference 50 inches   Waist to Hip Ratio 0.94 %   BMI (Calculated) 36.3  Triceps Skinfold 18 mm   % Body Fat 34.8 %   Grip Strength 34.5 kg   Flexibility 11.5 in   Single Leg Stand 14.85 seconds       Nutrition Therapy Plan and Nutrition Goals:     Nutrition Therapy & Goals - 12/15/16 0802      Nutrition Therapy   Diet Carb Modified, Therapeutic Lifestyle Changes     Personal Nutrition Goals   Nutrition Goal Wt loss of 1-2 lb/week to a wt loss goal of 6-24 lb at graduation from [place].      Intervention Plan   Intervention Prescribe, educate and counsel regarding individualized specific dietary modifications aiming towards targeted core components such as weight, hypertension, lipid management, diabetes, heart failure and other comorbidities.   Expected Outcomes Short Term Goal: Understand basic principles of dietary content, such as calories, fat, sodium, cholesterol and nutrients.;Long Term Goal: Adherence to prescribed nutrition plan.      Nutrition Discharge: Nutrition Scores:   Nutrition Goals Re-Evaluation:   Nutrition Goals Re-Evaluation:   Nutrition Goals Discharge (Final Nutrition Goals Re-Evaluation):   Psychosocial: Target Goals: Acknowledge presence or absence of significant depression and/or stress, maximize coping skills, provide positive support system. Participant is able to verbalize types and ability to use techniques and skills needed for reducing stress and depression.  Initial Review & Psychosocial Screening:     Initial Psych Review & Screening - 12/18/16 1635      Screening Interventions   Interventions Encouraged to exercise      Quality of Life Scores:     Quality of Life - 12/22/16 1610      Quality of Life Scores   Health/Function Pre 21.6 %   Socioeconomic Pre 25.64 %   Psych/Spiritual Pre 23.14 %   Family Pre 24 %   GLOBAL Pre 23.1 %       PHQ-9: Recent Review Flowsheet Data    Depression screen [facility] 2/9 12/18/2016   Decreased Interest 0   Down, Depressed, Hopeless 0   PHQ - 2 Score 0     Interpretation of Total Score  Total Score Depression Severity:  1-4 = Minimal depression, 5-9 = Mild depression, 10-14 = Moderate depression, 15-19 = Moderately severe depression, 20-27 = Severe depression   Psychosocial Evaluation and Intervention:   Psychosocial Re-Evaluation:     Psychosocial Re-Evaluation    [place] Name 12/20/16 1801 01/16/17 1540 02/13/17 1425 03/14/17 1808 04/10/17 1546     Psychosocial Re-Evaluation   Current issues with None Identified None Identified None Identified None Identified None Identified   Interventions Encouraged to attend Cardiac Rehabilitation for the exercise Encouraged to attend Cardiac Rehabilitation for the exercise Encouraged to attend Cardiac Rehabilitation for the exercise Encouraged to attend Cardiac Rehabilitation for the exercise Encouraged to attend Cardiac Rehabilitation for the exercise   Continue Psychosocial Services  No Follow up required No Follow up required No Follow up required No Follow up required No Follow up required      Psychosocial Discharge (Final Psychosocial Re-Evaluation):     Psychosocial Re-Evaluation - 04/10/17 1546      Psychosocial Re-Evaluation   Current issues with None Identified   Interventions Encouraged to attend Cardiac Rehabilitation for the exercise   Continue Psychosocial Services  No Follow up required      Vocational Rehabilitation: Provide vocational rehab assistance to qualifying candidates.   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      Education: Education Goals: Education classes  will be provided on a weekly basis, covering required topics. Participant will state understanding/return demonstration of topics presented.  Learning Barriers/Preferences:     Learning Barriers/Preferences - 12/14/16 1412      Learning Barriers/Preferences   Learning Barriers Sight  reading   Learning Preferences Skilled Demonstration;Written Material      Education Topics: Count Your Pulse:  -Group instruction provided by verbal instruction, demonstration, patient participation and written materials to support subject.  Instructors address importance of being able to find your pulse and how to count your pulse when at home without a heart monitor.  Patients get hands on experience counting their pulse with staff help and individually.   Heart Attack, Angina, and Risk Factor Modification:  -Group instruction provided by verbal instruction, video, and written materials to support subject.  Instructors address signs and symptoms of angina and heart attacks.    Also discuss risk factors for heart disease and how to make changes to improve heart health risk factors.   Functional Fitness:  -Group instruction provided by verbal instruction, demonstration, patient participation, and written materials to support subject.  Instructors address safety measures for doing things around the house.  Discuss how to get up and down off the floor, how to pick things up properly, how to safely get out of a chair without assistance, and balance training.   Meditation and Mindfulness:  -Group instruction provided by verbal instruction, patient participation, and written materials to support subject.  Instructor addresses importance of mindfulness and meditation practice to help reduce stress and improve awareness.  Instructor also leads participants through a meditation exercise.    CARDIAC REHAB PHASE II EXERCISE from 01/10/2017 in [place]  Date  01/10/17  Instruction  Review Code  2- meets goals/outcomes      Stretching for Flexibility and Mobility:  -Group instruction provided by verbal instruction, patient participation, and written materials to support subject.  Instructors lead participants through series of stretches that are designed to increase flexibility thus improving mobility.  These stretches are additional exercise for major muscle groups that are typically performed during regular warm up and cool down.   Hands Only CPR:  -Group verbal, video, and participation provides a basic overview of AHA guidelines for community CPR. Role-play of emergencies allow participants the opportunity to practice calling for help and chest compression technique with discussion of AED use.   Hypertension: -Group verbal and written instruction that provides a basic overview of hypertension including the most recent diagnostic guidelines, risk factor reduction with self-care instructions and medication management.    Nutrition I class: Heart Healthy Eating:  -Group instruction provided by PowerPoint slides, verbal discussion, and written materials to support subject matter. The instructor gives an explanation and review of the Therapeutic Lifestyle Changes diet recommendations, which includes a discussion on lipid goals, dietary fat, sodium, fiber, plant stanol/sterol esters, sugar, and the components of a well-balanced, healthy diet.   CARDIAC REHAB PHASE II EXERCISE from 01/10/2017 in [place]  Date  12/25/16  Educator  RD  Instruction Review Code  Not applicable [class handouts given]      Nutrition II class: Lifestyle Skills:  -Group instruction provided by PowerPoint slides, verbal discussion, and written  materials to support subject matter. The instructor gives an explanation and review of label reading, grocery shopping for heart health, heart healthy recipe modifications, and ways to make healthier choices when eating  out.   CARDIAC REHAB PHASE II EXERCISE from 01/10/2017 in [place]  Date  12/25/16  Educator  RD  Instruction Review Code  Not applicable [class handouts given]      Diabetes Question & Answer:  -Group instruction provided by PowerPoint slides, verbal discussion, and written materials to support subject matter. The instructor gives an explanation and review of diabetes co-morbidities, pre- and post-prandial blood glucose goals, pre-exercise blood glucose goals, signs, symptoms, and treatment of hypoglycemia and hyperglycemia, and foot care basics.   Diabetes Blitz:  -Group instruction provided by PowerPoint slides, verbal discussion, and written materials to support subject matter. The instructor gives an explanation and review of the physiology behind type 1 and type 2 diabetes, diabetes medications and rational behind using different medications, pre- and post-prandial blood glucose recommendations and Hemoglobin A1c goals, diabetes diet, and exercise including blood glucose guidelines for exercising safely.    CARDIAC REHAB PHASE II EXERCISE from 01/10/2017 in [place]  Date  12/25/16  Educator  RD  Instruction Review Code  Not applicable [class handouts given]      Portion Distortion:  -Group instruction provided by PowerPoint slides, verbal discussion, written materials, and food models to support subject matter. The instructor gives an explanation of serving size versus portion size, changes in portions sizes over the last 20 years, and what consists of a serving from each food group.   Stress Management:  -Group instruction provided by verbal instruction, video, and written materials to support subject matter.  Instructors review role of stress in heart disease and how to cope with stress positively.     Exercising on Your Own:  -Group instruction provided by verbal instruction, power point, and written materials  to support subject.  Instructors discuss benefits of exercise, components of exercise, frequency and intensity of exercise, and end points for exercise.  Also discuss use of nitroglycerin and activating EMS.  Review options of places to exercise outside of rehab.  Review guidelines for sex with heart disease.   Cardiac Drugs I:  -Group instruction provided by verbal instruction and written materials to support subject.  Instructor reviews cardiac drug classes: antiplatelets, anticoagulants, beta blockers, and statins.  Instructor discusses reasons, side effects, and lifestyle considerations for each drug class.   Cardiac Drugs II:  -Group instruction provided by verbal instruction and written materials to support subject.  Instructor reviews cardiac drug classes: angiotensin converting enzyme inhibitors (ACE-I), angiotensin II receptor blockers (ARBs), nitrates, and calcium channel blockers.  Instructor discusses reasons, side effects, and lifestyle considerations for each drug class.   Anatomy and Physiology of the Circulatory System:  Group verbal and written instruction and models provide basic cardiac anatomy and physiology, with the coronary electrical and arterial systems. Review of: AMI, Angina, Valve disease, Heart Failure, Peripheral Artery Disease, Cardiac Arrhythmia, Pacemakers, and the ICD.   Other Education:  -Group or individual verbal, written, or video instructions that support the educational goals of the cardiac rehab program.   Knowledge Questionnaire Score:   Core Components/Risk Factors/Patient Goals at Admission:     Personal Goals and Risk Factors at Admission - 12/14/16 1413      Core Components/Risk Factors/Patient Goals on Admission    Weight Management Yes;Weight Loss   Intervention Weight  Management: Develop a combined nutrition and exercise program designed to reach desired caloric intake, while maintaining appropriate intake of nutrient and fiber, sodium and  fats, and appropriate energy expenditure required for the weight goal.;Weight Management: Provide education and appropriate resources to help participant work on and attain dietary goals.;Weight Management/Obesity: Establish reasonable short term and long term weight goals.   Expected Outcomes Short Term: Continue to assess and modify interventions until short term weight is achieved;Long Term: Adherence to nutrition and physical activity/exercise program aimed toward attainment of established weight goal;Weight Maintenance: Understanding of the daily nutrition guidelines, which includes 25-35% calories from fat, 7% or less cal from saturated fats, less than 200mg  cholesterol, less than 1.5gm of sodium, & 5 or more servings of fruits and vegetables daily;Weight Loss: Understanding of general recommendations for a balanced deficit meal plan, which promotes 1-2 lb weight loss per week and includes a negative energy balance of [phone] kcal/d;Understanding recommendations for meals to include 15-35% energy as protein, 25-35% energy from fat, 35-60% energy from carbohydrates, less than 200mg  of dietary cholesterol, 20-35 gm of total fiber daily;Understanding of distribution of calorie intake throughout the day with the consumption of 4-5 meals/snacks   Heart Failure Yes   Intervention Provide a combined exercise and nutrition program that is supplemented with education, support and counseling about heart failure. Directed toward relieving symptoms such as shortness of breath, decreased exercise tolerance, and extremity edema.   Expected Outcomes Improve functional capacity of life;Short term: Attendance in program 2-3 days a week with increased exercise capacity. Reported lower sodium intake. Reported increased fruit and vegetable intake. Reports medication compliance.;Short term: Daily weights obtained and reported for increase. Utilizing diuretic protocols set by physician.;Long term: Adoption of self-care skills  and reduction of barriers for early signs and symptoms recognition and intervention leading to self-care maintenance.   Hypertension Yes   Intervention Provide education on lifestyle modifcations including regular physical activity/exercise, weight management, moderate sodium restriction and increased consumption of fresh fruit, vegetables, and low fat dairy, alcohol moderation, and smoking cessation.;Monitor prescription use compliance.   Expected Outcomes Short Term: Continued assessment and intervention until BP is < 140/17mm HG in hypertensive participants. < 130/28mm HG in hypertensive participants with diabetes, heart failure or chronic kidney disease.;Long Term: Maintenance of blood pressure at goal levels.   Lipids Yes   Intervention Provide education and support for participant on nutrition & aerobic/resistive exercise along with prescribed medications to achieve LDL 70mg , HDL >40mg .   Expected Outcomes Short Term: Participant states understanding of desired cholesterol values and is compliant with medications prescribed. Participant is following exercise prescription and nutrition guidelines.;Long Term: Cholesterol controlled with medications as prescribed, with individualized exercise RX and with personalized nutrition plan. Value goals: LDL < 70mg , HDL > 40 mg.   Stress Yes   Intervention Offer individual and/or small group education and counseling on adjustment to heart disease, stress management and health-related lifestyle change. Teach and support self-help strategies.;Refer participants experiencing significant psychosocial distress to appropriate mental health specialists for further evaluation and treatment. When possible, include family members and significant others in education/counseling sessions.   Expected Outcomes Short Term: Participant demonstrates changes in health-related behavior, relaxation and other stress management skills, ability to obtain effective social support, and  compliance with psychotropic medications if prescribed.;Long Term: Emotional wellbeing is indicated by absence of clinically significant psychosocial distress or social isolation.   Personal Goal Other Yes   Personal Goal Be able to walk up and down the hill, negogiate stairs  without difficulty. Lose weight in the belly region goal weight ~200lbs   Intervention Provide exercise programming to assist with improve functional mobility and cardiorespiratory fitness.    Expected Outcomes Pt will lose weight, gain muscle mass and be able to negogiate stairs and walk up/downhill.      Core Components/Risk Factors/Patient Goals Review:    Core Components/Risk Factors/Patient Goals at Discharge (Final Review):    ITP Comments:     ITP Comments    Row Name 12/14/16 1410           ITP Comments Dr. 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 Recommend continued exercise and life style modification education including  stress management and relaxation techniques to decrease cardiac risk profile. Dr Gertie Exon has been absent from cardiac rehab. Dr Gertie Exon last day of exercise was on 03/09/2017.Eric Steidle, RN,BSN 04/10/2017 3:56 PM

## 2017-04-11 ENCOUNTER — Encounter (HOSPITAL_COMMUNITY)
Admission: RE | Admit: 2017-04-11 | Discharge: 2017-04-11 | Disposition: A | Discharge status: Home / Self Care | Payer: BC Managed Care – PPO | Source: Ambulatory Visit | Attending: Interventional Cardiology | Admitting: Interventional Cardiology

## 2017-04-11 DIAGNOSIS — I5023 Acute on chronic systolic (congestive) heart failure: Secondary | ICD-10-CM | POA: Insufficient documentation

## 2017-04-11 DIAGNOSIS — I469 Cardiac arrest, cause unspecified: Secondary | ICD-10-CM | POA: Insufficient documentation

## 2017-04-11 DIAGNOSIS — I5022 Chronic systolic (congestive) heart failure: Secondary | ICD-10-CM

## 2017-04-12 NOTE — Progress Notes (Signed)
Discharge Summary  Patient Details  Name: Eric Bachtel  MD MRN: [id number] Date of Birth: [birth year] Referring Provider:     CARDIAC REHAB PHASE II ORIENTATION from 12/14/2016 in [place]  Referring Provider  Eric Schick MD       Number of Visits: 26  Reason for Discharge:  Early Exit:  Insurance  Smoking History:  History  Smoking Status  . Never Smoker  Smokeless Tobacco  . Never Used    Diagnosis:  Heart failure, chronic systolic (HCC)  ADL UCSD:   Initial Exercise Prescription:     Initial Exercise Prescription - 12/14/16 1500      Date of Initial Exercise RX and Referring Provider   Date 12/14/16   Referring Provider Eric Schick MD     Treadmill   MPH 2   Grade 0   Minutes 10   METs 2.53     Recumbant Bike   Level 1.5   Minutes 10   METs 2.44     NuStep   Level 1   Minutes 10   METs 2.1     Prescription Details   Frequency (times per week) 5   Duration Progress to 45 minutes of aerobic exercise without signs/symptoms of physical distress     Intensity   THRR 40-80% of Max Heartrate 62-124   Ratings of Perceived Exertion 11-13   Perceived Dyspnea 0-4     Progression   Progression Continue to progress workloads to maintain intensity without signs/symptoms of physical distress.     Resistance Training   Training Prescription Yes   Weight 2   Reps 10-15      Discharge Exercise Prescription (Final Exercise Prescription Changes):     Exercise Prescription Changes - 05/02/17 1400      Response to Exercise   Blood Pressure (Admit) 160/84   Blood Pressure (Exercise) 128/62   Blood Pressure (Exit) 140/80   Heart Rate (Admit) 94 bpm   Heart Rate (Exercise) 99 bpm   Heart Rate (Exit) 69 bpm   Rating of Perceived Exertion (Exercise) 12   Duration Progress to 45 minutes of aerobic exercise without signs/symptoms of physical distress   Intensity THRR unchanged     Progression   Progression Continue  to progress workloads to maintain intensity without signs/symptoms of physical distress.   Average METs 2.9     Resistance Training   Training Prescription Yes   Weight 4lb   Reps 10-15     Treadmill   MPH 2.5   Grade 1   Minutes 10   METs 3.26     Recumbant Bike   Level 4   Minutes 10   METs 3.26     NuStep   Level 5   Minutes 10   METs 2.8     Home Exercise Plan   Plans to continue exercise at Home (comment)   Frequency Add 3 additional days to program exercise sessions.   Initial Home Exercises Provided 12/27/16      Functional Capacity:     6 Minute Walk    Row Name 12/14/16 1557         6 Minute Walk   Phase Initial     Distance 1337 feet     Walk Time 6 minutes     # of Rest Breaks 0     MPH 2.53     METS 3.1     RPE 11     VO2 Peak 10.76  Symptoms No     Resting HR 69 bpm     Resting BP 158/84     Max Ex. HR 108 bpm     Max Ex. BP 164/88     2 Minute Post BP 140/62        Psychological, QOL, Others - Outcomes: PHQ 2/9: Depression screen PHQ 2/9 12/18/2016  Decreased Interest 0  Down, Depressed, Hopeless 0  PHQ - 2 Score 0    Quality of Life:     Quality of Life - 12/22/16 1610      Quality of Life Scores   Health/Function Pre 21.6 %   Socioeconomic Pre 25.64 %   Psych/Spiritual Pre 23.14 %   Family Pre 24 %   GLOBAL Pre 23.1 %      Personal Goals: Goals established at orientation with interventions provided to work toward goal.     Personal Goals and Risk Factors at Admission - 12/14/16 1413      Core Components/Risk Factors/Patient Goals on Admission    Weight Management Yes;Weight Loss   Intervention Weight Management: Develop a combined nutrition and exercise program designed to reach desired caloric intake, while maintaining appropriate intake of nutrient and fiber, sodium and fats, and appropriate energy expenditure required for the weight goal.;Weight Management: Provide education and appropriate resources to help  participant work on and attain dietary goals.;Weight Management/Obesity: Establish reasonable short term and long term weight goals.   Expected Outcomes Short Term: Continue to assess and modify interventions until short term weight is achieved;Long Term: Adherence to nutrition and physical activity/exercise program aimed toward attainment of established weight goal;Weight Maintenance: Understanding of the daily nutrition guidelines, which includes 25-35% calories from fat, 7% or less cal from saturated fats, less than 281m cholesterol, less than 1.5gm of sodium, & 5 or more servings of fruits and vegetables daily;Weight Loss: Understanding of general recommendations for a balanced deficit meal plan, which promotes 1-2 lb weight loss per week and includes a negative energy balance of [phone] kcal/d;Understanding recommendations for meals to include 15-35% energy as protein, 25-35% energy from fat, 35-60% energy from carbohydrates, less than 205mof dietary cholesterol, 20-35 gm of total fiber daily;Understanding of distribution of calorie intake throughout the day with the consumption of 4-5 meals/snacks   Heart Failure Yes   Intervention Provide a combined exercise and nutrition program that is supplemented with education, support and counseling about heart failure. Directed toward relieving symptoms such as shortness of breath, decreased exercise tolerance, and extremity edema.   Expected Outcomes Improve functional capacity of life;Short term: Attendance in program 2-3 days a week with increased exercise capacity. Reported lower sodium intake. Reported increased fruit and vegetable intake. Reports medication compliance.;Short term: Daily weights obtained and reported for increase. Utilizing diuretic protocols set by physician.;Long term: Adoption of self-care skills and reduction of barriers for early signs and symptoms recognition and intervention leading to self-care maintenance.   Hypertension Yes    Intervention Provide education on lifestyle modifcations including regular physical activity/exercise, weight management, moderate sodium restriction and increased consumption of fresh fruit, vegetables, and low fat dairy, alcohol moderation, and smoking cessation.;Monitor prescription use compliance.   Expected Outcomes Short Term: Continued assessment and intervention until BP is < 140/9021mG in hypertensive participants. < 130/27m72m in hypertensive participants with diabetes, heart failure or chronic kidney disease.;Long Term: Maintenance of blood pressure at goal levels.   Lipids Yes   Intervention Provide education and support for participant on nutrition & aerobic/resistive exercise along  with prescribed medications to achieve LDL <49m, HDL >415m   Expected Outcomes Short Term: Participant states understanding of desired cholesterol values and is compliant with medications prescribed. Participant is following exercise prescription and nutrition guidelines.;Long Term: Cholesterol controlled with medications as prescribed, with individualized exercise RX and with personalized nutrition plan. Value goals: LDL < 7058mHDL > 40 mg.   Stress Yes   Intervention Offer individual and/or small group education and counseling on adjustment to heart disease, stress management and health-related lifestyle change. Teach and support self-help strategies.;Refer participants experiencing significant psychosocial distress to appropriate mental health specialists for further evaluation and treatment. When possible, include family members and significant others in education/counseling sessions.   Expected Outcomes Short Term: Participant demonstrates changes in health-related behavior, relaxation and other stress management skills, ability to obtain effective social support, and compliance with psychotropic medications if prescribed.;Long Term: Emotional wellbeing is indicated by absence of clinically significant  psychosocial distress or social isolation.   Personal Goal Other Yes   Personal Goal Be able to walk up and down the hill, negogiate stairs without difficulty. Lose weight in the belly region goal weight ~200lbs   Intervention Provide exercise programming to assist with improve functional mobility and cardiorespiratory fitness.    Expected Outcomes Pt will lose weight, gain muscle mass and be able to negogiate stairs and walk up/downhill.       Personal Goals Discharge:   Nutrition & Weight - Outcomes:     Pre Biometrics - 12/14/16 1604      Pre Biometrics   Height 5' 7.75" (1.721 m)   Weight 236 lb 12.4 oz (107.4 kg)   Waist Circumference 47 inches   Hip Circumference 50 inches   Waist to Hip Ratio 0.94 %   BMI (Calculated) 36.3   Triceps Skinfold 18 mm   % Body Fat 34.8 %   Grip Strength 34.5 kg   Flexibility 11.5 in   Single Leg Stand 14.85 seconds         Post Biometrics - 05/02/17 1420       Post  Biometrics   Weight 238 lb 12.1 oz (108.3 kg)      Nutrition:     Nutrition Therapy & Goals - 12/15/16 0802      Nutrition Therapy   Diet Carb Modified, Therapeutic Lifestyle Changes     Personal Nutrition Goals   Nutrition Goal Wt loss of 1-2 lb/week to a wt loss goal of 6-24 lb at graduation from Cardiac Rehab.      Intervention Plan   Intervention Prescribe, educate and counsel regarding individualized specific dietary modifications aiming towards targeted core components such as weight, hypertension, lipid management, diabetes, heart failure and other comorbidities.   Expected Outcomes Short Term Goal: Understand basic principles of dietary content, such as calories, fat, sodium, cholesterol and nutrients.;Long Term Goal: Adherence to prescribed nutrition plan.      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 Pt maintained fair attendance and progressed nicely during his participation in rehab as evidenced by increased MET level.   .  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 Pt feels he has achieved his goals during cardiac rehab.  Dr WhiVenetia Maxonans to  continue exercise on his own.Dr Gertie Exon last day of exercise was on 03/09/2017.Macyn Remmert, RN,BSN 05/08/2017 2:23 PM 05/08/2017 2:18 PM

## 2017-04-13 ENCOUNTER — Encounter (HOSPITAL_COMMUNITY)
Admission: RE | Admit: 2017-04-13 | Discharge: 2017-04-13 | Disposition: A | Discharge status: Home / Self Care | Payer: BC Managed Care – PPO | Source: Ambulatory Visit | Attending: Interventional Cardiology | Admitting: Interventional Cardiology

## 2017-04-13 DIAGNOSIS — I5022 Chronic systolic (congestive) heart failure: Secondary | ICD-10-CM

## 2017-05-02 NOTE — Addendum Note (Signed)
Encounter addended by: Meta Hatchet on: 05/02/2017  2:20 PM<BR>    Actions taken: Flowsheet data copied forward, Flowsheet accepted, Visit Navigator Flowsheet section accepted

## 2017-05-07 NOTE — Addendum Note (Signed)
Encounter addended by: Jewel Baize, RD on: 05/07/2017 12:19 PM<BR>    Actions taken: Visit Navigator Flowsheet section accepted

## 2017-06-18 ENCOUNTER — Encounter: Payer: Self-pay | Admitting: Internal Medicine

## 2017-06-18 ENCOUNTER — Ambulatory Visit (INDEPENDENT_AMBULATORY_CARE_PROVIDER_SITE_OTHER): Payer: Medicare Other | Admitting: Internal Medicine

## 2017-06-18 VITALS — BP 122/60 | HR 77 | Resp 16 | Ht 69.0 in | Wt 240.8 lb

## 2017-06-18 DIAGNOSIS — I5022 Chronic systolic (congestive) heart failure: Secondary | ICD-10-CM

## 2017-06-18 DIAGNOSIS — I4901 Ventricular fibrillation: Secondary | ICD-10-CM | POA: Diagnosis not present

## 2017-06-18 DIAGNOSIS — I480 Paroxysmal atrial fibrillation: Secondary | ICD-10-CM | POA: Diagnosis not present

## 2017-06-18 LAB — CUP PACEART INCLINIC DEVICE CHECK
Date Time Interrogation Session: 20180924165015
HighPow Impedance: 68.625
Implantable Lead Location: 753858
Implantable Lead Location: 753859
Implantable Lead Location: 753860
Implantable Lead Model: 7122
Implantable Pulse Generator Implant Date: 20170525
Lead Channel Impedance Value: 450 Ohm
Lead Channel Impedance Value: 662.5 Ohm
Lead Channel Pacing Threshold Amplitude: 0.5 V
Lead Channel Pacing Threshold Amplitude: 0.75 V
Lead Channel Pacing Threshold Amplitude: 1.25 V
Lead Channel Pacing Threshold Amplitude: 1.25 V
Lead Channel Pacing Threshold Pulse Width: 0.5 ms
Lead Channel Pacing Threshold Pulse Width: 0.5 ms
Lead Channel Pacing Threshold Pulse Width: 0.5 ms
Lead Channel Pacing Threshold Pulse Width: 0.5 ms
Lead Channel Setting Pacing Amplitude: 2 V
Lead Channel Setting Pacing Amplitude: 2.125
Lead Channel Setting Pacing Pulse Width: 0.5 ms
MDC IDC LEAD IMPLANT DT: 20170525
MDC IDC LEAD IMPLANT DT: 20170525
MDC IDC LEAD IMPLANT DT: 20170525
MDC IDC MSMT BATTERY REMAINING LONGEVITY: 75 mo
MDC IDC MSMT LEADCHNL LV PACING THRESHOLD PULSEWIDTH: 0.5 ms
MDC IDC MSMT LEADCHNL RA IMPEDANCE VALUE: 437.5 Ohm
MDC IDC MSMT LEADCHNL RA PACING THRESHOLD AMPLITUDE: 0.75 V
MDC IDC MSMT LEADCHNL RA SENSING INTR AMPL: 2.3 mV
MDC IDC MSMT LEADCHNL RV PACING THRESHOLD AMPLITUDE: 0.5 V
MDC IDC MSMT LEADCHNL RV PACING THRESHOLD PULSEWIDTH: 0.5 ms
MDC IDC MSMT LEADCHNL RV SENSING INTR AMPL: 12 mV
MDC IDC PG SERIAL: 7357926
MDC IDC SET LEADCHNL LV PACING PULSEWIDTH: 0.5 ms
MDC IDC SET LEADCHNL RA PACING AMPLITUDE: 2 V
MDC IDC SET LEADCHNL RV SENSING SENSITIVITY: 0.5 mV
MDC IDC STAT BRADY RA PERCENT PACED: 7.4 %
MDC IDC STAT BRADY RV PERCENT PACED: 98 %

## 2017-06-18 MED ORDER — AMIODARONE HCL 200 MG PO TABS: ORAL_TABLET | ORAL | 3 refills | Status: DC

## 2017-06-18 NOTE — Progress Notes (Signed)
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 He has not had any additional ICD therapies. No chest pain but he does have class 2 dyspnea. No syncope. No shocks. No edema. Allergies  Allergen Reactions  . Lactose Intolerance (Gi) Diarrhea     Current Outpatient Prescriptions  Medication Sig Dispense Refill  . acetaminophen (TYLENOL) 500 MG tablet Take 500 mg by mouth every 6 (six) hours as needed.    Marland Kitchen amiodarone (PACERONE) 200 MG tablet Take 1 tablet (200 mg total) by mouth daily. 30 tablet 11  . apixaban (ELIQUIS) 5 MG TABS tablet Take 1 tablet (5 mg total) by mouth 2 (two) times daily. 60 tablet 11  . carvedilol (COREG) 25 MG tablet Take 1 tablet (25 mg total) by mouth 2 (two) times daily. 60 tablet 11  . cefUROXime (CEFTIN) 500 MG tablet Take 500 mg by mouth 2 (two) times daily with a meal.    . cholecalciferol (VITAMIN D) 1000 units tablet Take 1,000 Units by mouth daily.    Marland Kitchen docusate sodium (COLACE) 100 MG capsule Take 100 mg by mouth daily as needed for mild constipation.    . fluticasone (FLONASE) 50 MCG/ACT nasal spray Place 2 sprays into the nose as needed for allergies.     . furosemide (LASIX) 40 MG tablet Take 1 tablet (40 mg total) by mouth daily. 30 tablet 11  . isosorbide-hydrALAZINE (BIDIL) 20-37.5 MG tablet Take 0.5 tablets by mouth 3 (three) times daily. 45 tablet 11  . levothyroxine (SYNTHROID, LEVOTHROID) 75 MCG tablet Take 1 tablet (75 mcg total) by mouth daily before breakfast. 30 tablet 11  . magnesium oxide (MAG-OX) 400 MG tablet Take 1 tablet (400 mg total) by mouth daily. 30 tablet 1  . rosuvastatin (CRESTOR) 20 MG tablet Take 1 tablet (20 mg total) by mouth daily. 30 tablet 11  . vitamin B-12 (CYANOCOBALAMIN) 100 MCG tablet Take 100 mcg by mouth daily as needed (energy).       No current facility-administered medications for this visit.      Past Medical History:  Diagnosis Date  . CHF (congestive heart failure) ([place])   . Chronic kidney disease    kidney fx studies increased   . Chronic lower back pain   . Claustrophobia   . Dysrhythmia    "palpitations"  . Exertional dyspnea 01/2012  . Heart murmur   . Hypertension   . Hypothyroidism   . Migraine 02/13/12   "opthalmic"  . Varicose vein of leg    right    ROS:   All systems reviewed and negative except as noted in the HPI.   Past Surgical History:  Procedure Laterality Date  . CARDIAC CATHETERIZATION N/A 02/17/2016   Procedure: Left Heart Cath and Coronary Angiography;  Surgeon: Jettie Booze, MD;  Location: [place] [facility];  Service: Cardiovascular;  Laterality: N/A;  . CARDIOVERSION  03/22/2012   Procedure: CARDIOVERSION;  Surgeon: Candee Furbish, MD;  Location: [facility];  Service: Cardiovascular;  Laterality: N/A;  . CARDIOVERSION  04/19/2012   Procedure: CARDIOVERSION;  Surgeon: Sinclair Grooms, MD;  Location: [facility];  Service: Cardiovascular;  Laterality: N/A;  . COLONOSCOPY N/A 04/11/2013   Procedure: COLONOSCOPY;  Surgeon: Inda Castle, MD;  Location: [facility] ENDOSCOPY;  Service: Endoscopy;  Laterality: N/A;  . COLONOSCOPY N/A 04/11/2013   Procedure: COLONOSCOPY;  Surgeon: Inda Castle, MD;  Location: [facility]OSCOPY;  Service: Endoscopy;  Laterality: N/A;  . EP IMPLANTABLE DEVICE N/A 02/17/2016   Procedure: BiV ICD Insertion CRT-D;  Surgeon: Evans Lance, MD;  Location: [place] [facility];  Service: Cardiovascular;  Laterality: N/A;  . FINGER SURGERY  2012   "4th digit right hand; thumb on left hand"  . RADIOLOGY WITH ANESTHESIA N/A 02/11/2016   Procedure: MRI OF THE BRAIN WITHOUT CONTRAST, LUMBAR WITHOUT CONTRAST;  Surgeon: Medication Radiologist, MD;  Location: [facility];  Service: Radiology;  Laterality: N/A;  DR. WOOD/MRI  . 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 TEE WITHOUT CARDIOVERSION  03/22/2012   Procedure: TRANSESOPHAGEAL ECHOCARDIOGRAM (TEE);  Surgeon: Candee Furbish, MD;  Location: [facility];  Service: Cardiovascular;  Laterality: N/A;  . TEE WITHOUT CARDIOVERSION N/A 02/08/2016   Procedure: TRANSESOPHAGEAL ECHOCARDIOGRAM (TEE);  Surgeon: Lelon Perla, MD;  Location: [facility];  Service: Cardiovascular;  Laterality: N/A;     Family History  Problem Relation Age of Onset  . Hypertension Mother   . Heart disease Mother   . Heart failure Mother   . Diabetes Mother   . Hypertension Father   . Heart disease Father   . Heart failure Father      Social History   Social History  . Marital status: Married    Spouse name: [name]  . Number of children: 1  . Years of education: N/A   Occupational History  .  [employer]   Social History Main Topics  . Smoking status: Never Smoker  . Smokeless tobacco: Never Used  . Alcohol use Yes     Comment: 02/13/12 "socially"  . Drug use: No  . Sexual activity: Not Currently   Other Topics Concern  . 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     BP 122/60   Pulse 77   Resp 16   Ht 5\' 9"  (1.753 m)   Wt 240 lb 12.8 oz (109.2 kg)   SpO2 97%   BMI 35.56 kg/m   Physical Exam:  Well appearing 66  Yo man, NAD HEENT: Unremarkable Neck:  7 cm JVD, no thyromegally Lymphatics:  No adenopathy Back:  No CVA tenderness Lungs:  Clear except for minimal rales in the bases. HEART:  Regular rate rhythm, no murmurs, no rubs, no clicks Abd:  soft, positive bowel sounds, no organomegally, no rebound, no guarding Ext:  2 plus pulses, no edema, no cyanosis, no clubbing Skin:  No rashes no nodules Neuro:  CN II through XII intact, motor grossly intact  DEVICE  Normal device function.  See PaceArt for details.   Assess/Plan: 1. 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 I have asked him to reduce his dose of amiodarone so that he is taking none on Sunday and 200 mg daily the rest of the week. 2. Chronic systolic heart failure - his symptoms are class 2. He will continue his current meds.  3. ICD - His [employer] Jude BiV ICD is working normally. Will recheck in several months. 4. PAF - he is stable on amiodarone. No symptoms.  Mikle Bosworth.D.

## 2017-06-18 NOTE — Patient Instructions (Addendum)
Medication Instructions:  Your physician has recommended you make the following change in your medication:  Decrease your amiodarone to:   Amiodarone 200 mg (one tablet) by mouth Monday through Saturday.  Do not take amiodarone on Sunday.  Labwork: None ordered.  Testing/Procedures: None ordered.  Follow-Up: Your physician wants you to follow-up in: one year with Dr. Lovena Le.  You will receive a reminder letter in the mail two months in advance. If you don't receive a letter, please call our office to schedule the follow-up appointment.  Remote monitoring is used to monitor your ICD from home. This monitoring reduces the number of office visits required to check your device to one time per year. It allows [place] to keep an eye on the functioning of your device to ensure it is working properly. You are scheduled for a device check from home on 09/19/2017. You may send your transmission at any time that day. If you have a wireless device, the transmission will be sent automatically. After your physician reviews your transmission, you will receive a postcard with your next transmission date.   Any Other Special Instructions Will Be Listed Below (If Applicable).   If you need a refill on your cardiac medications before your next appointment, please call your pharmacy.

## 2017-06-29 DIAGNOSIS — I130 Hypertensive heart and chronic kidney disease with heart failure and stage 1 through stage 4 chronic kidney disease, or unspecified chronic kidney disease: Secondary | ICD-10-CM | POA: Diagnosis not present

## 2017-06-29 DIAGNOSIS — E039 Hypothyroidism, unspecified: Secondary | ICD-10-CM | POA: Diagnosis not present

## 2017-06-29 DIAGNOSIS — E1122 Type 2 diabetes mellitus with diabetic chronic kidney disease: Secondary | ICD-10-CM | POA: Diagnosis not present

## 2017-06-29 DIAGNOSIS — N08 Glomerular disorders in diseases classified elsewhere: Secondary | ICD-10-CM | POA: Diagnosis not present

## 2017-06-29 DIAGNOSIS — N183 Chronic kidney disease, stage 3 (moderate): Secondary | ICD-10-CM | POA: Diagnosis not present

## 2017-08-24 DIAGNOSIS — E039 Hypothyroidism, unspecified: Secondary | ICD-10-CM | POA: Diagnosis not present

## 2017-09-19 ENCOUNTER — Telehealth: Payer: Self-pay | Admitting: Cardiology

## 2017-09-19 ENCOUNTER — Ambulatory Visit (INDEPENDENT_AMBULATORY_CARE_PROVIDER_SITE_OTHER): Payer: Medicare Other | Admitting: *Deleted

## 2017-09-19 DIAGNOSIS — I4901 Ventricular fibrillation: Secondary | ICD-10-CM | POA: Diagnosis not present

## 2017-09-19 NOTE — Telephone Encounter (Signed)
LMOVM reminding pt to send remote transmission.   

## 2017-09-20 ENCOUNTER — Encounter: Payer: Self-pay | Admitting: Cardiology

## 2017-09-27 NOTE — Progress Notes (Signed)
Remote ICD transmission.   

## 2017-09-28 ENCOUNTER — Encounter: Payer: Self-pay | Admitting: Cardiology

## 2017-10-01 LAB — CUP PACEART REMOTE DEVICE CHECK
Battery Remaining Longevity: 71 mo
Battery Remaining Percentage: 80 %
Brady Statistic AP VS Percent: 1 %
Brady Statistic AS VP Percent: 85 %
Brady Statistic AS VS Percent: 2.1 %
HIGH POWER IMPEDANCE MEASURED VALUE: 70 Ohm
HIGH POWER IMPEDANCE MEASURED VALUE: 70 Ohm
Implantable Lead Implant Date: 20170525
Implantable Lead Implant Date: 20170525
Implantable Lead Location: 753859
Implantable Lead Location: 753860
Implantable Lead Model: 7122
Implantable Pulse Generator Implant Date: 20170525
Lead Channel Impedance Value: 440 Ohm
Lead Channel Impedance Value: 710 Ohm
Lead Channel Pacing Threshold Amplitude: 0.75 V
Lead Channel Sensing Intrinsic Amplitude: 2.1 mV
Lead Channel Setting Pacing Amplitude: 2 V
Lead Channel Setting Pacing Amplitude: 2 V
Lead Channel Setting Pacing Pulse Width: 0.5 ms
MDC IDC LEAD IMPLANT DT: 20170525
MDC IDC LEAD LOCATION: 753858
MDC IDC MSMT BATTERY VOLTAGE: 2.99 V
MDC IDC MSMT LEADCHNL LV PACING THRESHOLD AMPLITUDE: 1 V
MDC IDC MSMT LEADCHNL LV PACING THRESHOLD PULSEWIDTH: 0.5 ms
MDC IDC MSMT LEADCHNL RA IMPEDANCE VALUE: 450 Ohm
MDC IDC MSMT LEADCHNL RA PACING THRESHOLD PULSEWIDTH: 0.5 ms
MDC IDC MSMT LEADCHNL RV PACING THRESHOLD AMPLITUDE: 0.5 V
MDC IDC MSMT LEADCHNL RV PACING THRESHOLD PULSEWIDTH: 0.5 ms
MDC IDC MSMT LEADCHNL RV SENSING INTR AMPL: 12 mV
MDC IDC PG SERIAL: 7357926
MDC IDC SESS DTM: 20181228212526
MDC IDC SET LEADCHNL RA PACING AMPLITUDE: 2 V
MDC IDC SET LEADCHNL RV PACING PULSEWIDTH: 0.5 ms
MDC IDC SET LEADCHNL RV SENSING SENSITIVITY: 0.5 mV
MDC IDC STAT BRADY AP VP PERCENT: 12 %
MDC IDC STAT BRADY RA PERCENT PACED: 12 %

## 2017-10-22 DIAGNOSIS — E039 Hypothyroidism, unspecified: Secondary | ICD-10-CM | POA: Diagnosis not present

## 2017-10-31 ENCOUNTER — Ambulatory Visit (INDEPENDENT_AMBULATORY_CARE_PROVIDER_SITE_OTHER): Payer: Medicare Other | Admitting: Interventional Cardiology

## 2017-10-31 ENCOUNTER — Encounter: Payer: Self-pay | Admitting: Interventional Cardiology

## 2017-10-31 VITALS — BP 128/74 | HR 79 | Ht 69.0 in | Wt 242.8 lb

## 2017-10-31 DIAGNOSIS — E119 Type 2 diabetes mellitus without complications: Secondary | ICD-10-CM

## 2017-10-31 DIAGNOSIS — I480 Paroxysmal atrial fibrillation: Secondary | ICD-10-CM

## 2017-10-31 DIAGNOSIS — I351 Nonrheumatic aortic (valve) insufficiency: Secondary | ICD-10-CM

## 2017-10-31 DIAGNOSIS — N184 Chronic kidney disease, stage 4 (severe): Secondary | ICD-10-CM

## 2017-10-31 DIAGNOSIS — I5022 Chronic systolic (congestive) heart failure: Secondary | ICD-10-CM

## 2017-10-31 DIAGNOSIS — I10 Essential (primary) hypertension: Secondary | ICD-10-CM | POA: Diagnosis not present

## 2017-10-31 DIAGNOSIS — I447 Left bundle-branch block, unspecified: Secondary | ICD-10-CM | POA: Diagnosis not present

## 2017-10-31 MED ORDER — FUROSEMIDE 40 MG PO TABS: 40.0000 mg | ORAL_TABLET | Freq: Every day | ORAL | 3 refills | Status: DC

## 2017-10-31 MED ORDER — CARVEDILOL 25 MG PO TABS: 25.0000 mg | ORAL_TABLET | Freq: Two times a day (BID) | ORAL | 3 refills | Status: DC

## 2017-10-31 MED ORDER — AMIODARONE HCL 200 MG PO TABS: ORAL_TABLET | ORAL | 3 refills | Status: DC

## 2017-10-31 NOTE — Progress Notes (Addendum)
Cardiology Office Note    Date: 10/31/2017  ID:  Eric Corning  MD, DOB [birth year], MRN [id number]  PCP:  Glendale Chard, MD  Cardiologist: Sinclair Grooms, MD   Chief Complaint  Patient presents with  . Congestive Heart Failure  . 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  Not aerobically exercising as much as possible.  Denies chest pain.  In absence of exercises gaining weight.  He denies edema, orthopnea, PND, syncope, device treatment/shock.  Dr. Lovena Le did cut amiodarone down to 200 mg 6 times per week.   Past Medical History:  Diagnosis Date  . CHF (congestive heart failure) ([place])   . Chronic kidney disease    kidney fx studies increased   . Chronic lower back pain   . Claustrophobia   . Dysrhythmia    "palpitations"  . Exertional dyspnea 01/2012  . Heart murmur   . Hypertension   . Hypothyroidism   . Migraine 02/13/12   "opthalmic"  . Varicose vein of leg    right    Past Surgical History:  Procedure Laterality Date  . CARDIAC CATHETERIZATION N/A 02/17/2016   Procedure: Left Heart Cath and Coronary Angiography;  Surgeon: Jettie Booze, MD;  Location: [place] [facility];  Service: Cardiovascular;  Laterality: N/A;  . CARDIOVERSION  03/22/2012   Procedure: CARDIOVERSION;  Surgeon: Candee Furbish, MD;  Location: [facility] END[facility];  Service: Cardiovascular;  Laterality: N/A;  . CARDIOVERSION  04/19/2012   Procedure: CARDIOVERSION;  Surgeon: Sinclair Grooms, MD;  Location: [place];  Service: Cardiovascular;  Laterality: N/A;  . COLONOSCOPY N/A 04/11/2013   Procedure: COLONOSCOPY;  Surgeon: Inda Castle, MD;  Location: [facility] ENDOSCOPY;  Service: Endoscopy;  Laterality: N/A;  .  COLONOSCOPY N/A 04/11/2013   Procedure: COLONOSCOPY;  Surgeon: Inda Castle, MD;  Location: [facility] ENDOSCOPY;  Service: Endoscopy;  Laterality: N/A;  . EP IMPLANTABLE DEVICE N/A 02/17/2016   Procedure: BiV ICD Insertion CRT-D;  Surgeon: Evans Lance, MD;  Location: [place] [facility];  Service: Cardiovascular;  Laterality: N/A;  . FINGER SURGERY  2012   "4th digit right hand; thumb on left hand"  . RADIOLOGY WITH ANESTHESIA N/A 02/11/2016   Procedure: MRI OF THE BRAIN WITHOUT CONTRAST, LUMBAR WITHOUT CONTRAST;  Surgeon: Medication Radiologist, MD;  Location: [place];  Service: Radiology;  Laterality: N/A;  DR. WOOD/MRI  . Skin melanocytoma excision  2012   "above left clavicle"  . TEE WITHOUT CARDIOVERSION  03/22/2012   Procedure: TRANSESOPHAGEAL ECHOCARDIOGRAM (TEE);  Surgeon: Candee Furbish, MD;  Location: [facility] ENDOSCOPY;  Service: Cardiovascular;  Laterality: N/A;  . TEE WITHOUT CARDIOVERSION N/A 02/08/2016   Procedure: TRANSESOPHAGEAL ECHOCARDIOGRAM (TEE);  Surgeon: Lelon Perla, MD;  Location: [facility] ENDOSCOPY;  Service: Cardiovascular;  Laterality: N/A;    Current Medications: Outpatient Medications Prior to Visit  Medication Sig Dispense Refill  . acetaminophen (TYLENOL) 500 MG tablet Take 500 mg by mouth every 6 (six) hours as needed.    Marland Kitchen apixaban (ELIQUIS) 5 MG TABS tablet Take 1 tablet (5 mg total) by mouth 2 (two) times daily. 60 tablet 11  . cholecalciferol (VITAMIN D) 1000 units tablet Take 1,000 Units by mouth daily.    . ciprofloxacin (CIPRO) 500 MG tablet Take  500 mg by mouth 2 (two) times daily.    Marland Kitchen docusate sodium (COLACE) 100 MG capsule Take 100 mg by mouth daily as needed for mild constipation.    . fluticasone (FLONASE) 50 MCG/ACT nasal spray Place 2 sprays into the nose as needed for allergies.     . isosorbide-hydrALAZINE (BIDIL) 20-37.5 MG tablet Take 0.5 tablets by mouth 3 (three) times daily. 45 tablet 11  . levothyroxine (SYNTHROID, LEVOTHROID) 75 MCG tablet Take 1 tablet  (75 mcg total) by mouth daily before breakfast. (Patient taking differently: Take 75 mcg by mouth daily before breakfast. ) 30 tablet 11  . oxymetazoline (AFRIN) 0.05 % nasal spray Place 1 spray into both nostrils 2 (two) times daily as needed for congestion.    . vitamin B-12 (CYANOCOBALAMIN) 100 MCG tablet Take 100 mcg by mouth daily as needed (energy).     . vitamin C (ASCORBIC ACID) 500 MG tablet Take 500 mg by mouth daily.    Marland Kitchen amiodarone (PACERONE) 200 MG tablet Take 200 mg (one tablet) by mouth daily Monday through Saturday.  Do not take on Sunday. 90 tablet 3  . carvedilol (COREG) 25 MG tablet Take 1 tablet (25 mg total) by mouth 2 (two) times daily. 60 tablet 11  . furosemide (LASIX) 40 MG tablet Take 1 tablet (40 mg total) by mouth daily. 30 tablet 11  . rosuvastatin (CRESTOR) 20 MG tablet Take 1 tablet (20 mg total) by mouth daily. 30 tablet 11  . cefUROXime (CEFTIN) 500 MG tablet Take 500 mg by mouth 2 (two) times daily with a meal.    . magnesium oxide (MAG-OX) 400 MG tablet Take 1 tablet (400 mg total) by mouth daily. (Patient not taking: Reported on 10/31/2017) 30 tablet 1   No facility-administered medications prior to visit.      Allergies:   Lactose intolerance (gi)   Social History   Socioeconomic History  . Marital status: Married    Spouse name: [name]  . Number of children: 1  . Years of education: Not on file  . Highest education level: Not on file  Occupational History    Employer: [employer]  Social Needs  . Financial resource strain: Not on file  . Food insecurity:    Worry: Not on file    Inability: Not on file  . Transportation needs:    Medical: Not on file    Non-medical: Not on file  Tobacco Use  . Smoking status: Never Smoker  . Smokeless tobacco: Never Used  Substance and Sexual Activity  . Alcohol use: Yes    Comment: 02/13/12 "socially"  . Drug use: No  . Sexual activity: Not Currently  Lifestyle  . Physical activity:    Days per  week: Not on file    Minutes per session: Not on file  . Stress: Not on file  Relationships  . Social connections:    Talks on phone: Not on file    Gets together: Not on file    Attends religious service: Not on file    Active member of club or organization: Not on file    Attends meetings of clubs or organizations: Not on file    Relationship status: Not on file  Other Topics Concern  . 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     Family History:  The patient's family history includes Diabetes in his  mother; Heart disease in his father and mother; Heart failure in his father and mother; Hypertension in his father and mother.   ROS:   Please see the history of present illness.    Some cough, and wheezing.  Otherwise unremarkable. All other systems reviewed and are negative.   PHYSICAL EXAM:   VS:  BP 128/74   Pulse 79   Ht 5\' 9"  (1.753 m)   Wt 242 lb 12.8 oz (110.1 kg)   SpO2 95%   BMI 35.86 kg/m    GEN: Well nourished, well developed, in no acute distress .  Moderate obesity. HEENT: normal  Neck: no JVD, carotid bruits, or masses Cardiac: RRR; 2/6 decrescendo aortic regurg murmurs; no rubs, or gallops,no edema. Respiratory:  clear to auscultation bilaterally, normal work of breathing GI: soft, nontender, nondistended, + BS MS: no deformity or atrophy  Skin: warm and dry, no rash Neuro:  Alert and Oriented x 3, Strength and sensation are intact Psych: euthymic mood, full affect  Wt Readings from Last 3 Encounters:  10/31/17 242 lb 12.8 oz (110.1 kg)  06/18/17 240 lb 12.8 oz (109.2 kg)  05/02/17 238 lb 12.1 oz (108.3 kg)      Studies/Labs Reviewed:   EKG:  EKG  None  Recent Labs: 04/11/2018: BUN 23; Creatinine, Ser 1.93; Potassium 3.9; Sodium 141   Lipid Panel    Component Value Date/Time   CHOL 163 12/27/2016 0848   TRIG 72 12/27/2016 0848   HDL 64 12/27/2016 0848   CHOLHDL 2.5  12/27/2016 0848   CHOLHDL 3.8 02/13/2012 1829   VLDL 30 02/13/2012 1829   LDLCALC 85 12/27/2016 0848    Additional studies/ records that were reviewed today include:  Not needed.    ASSESSMENT:    1. Paroxysmal atrial fibrillation (HCC)   2. Chronic systolic heart failure ([place])   3. Hypertension, accelerated   4. Left bundle branch block   5. Diabetes mellitus type 2 in nonobese (HCC)   6. CKD (chronic kidney disease), stage IV ([place])   7. Nonrheumatic aortic valve insufficiency      PLAN:  In order of problems listed above:  1. No recurrence of atrial arrhythmia that he is aware of.  No evidence per his device. 2. No clinical evidence of volume overload despite weight gain.  The weight gain appears to be related to dietary weight gain. 3. Blood pressure is excellently controlled. 4. Not assessed 5. Not addressed 6. Most recent creatinine 1.9.  Continue current dose of apixaban.  Calculated GFR was 42 mL/min. 7. Clinically unchanged and should have an echocardiogram this year.  Clinical follow-up 6-9 months.  We will get a comprehensive metabolic panel and TSH on that visit.  No change in current regimen other than as noted above.  Edit of this note performed 04/21/2018 and 04/25/2018  Medication Adjustments/Labs and Tests Ordered: Current medicines are reviewed at length with the patient today.  Concerns regarding medicines are outlined above.  Medication changes, Labs and Tests ordered today are listed in the Patient Instructions below. Patient Instructions  Medication Instructions:  Your physician recommends that you continue on your current medications as directed. Please refer to the Current Medication list given to you today.  Labwork: Liver and TSH in April  Testing/Procedures: None  Follow-Up: Your physician wants you to follow-up in: 6-8 months with Dr. Tamala Julian.  You will receive a reminder letter in the mail two months in advance. If you don't receive a letter,  please call  our office to schedule the follow-up appointment.   Any Other Special Instructions Will Be Listed Below (If Applicable).     If you need a refill on your cardiac medications before your next appointment, please call your pharmacy.      Signed, Sinclair Grooms, MD  10/31/2017     [facility] [address], [place], [place]  [address] Phone: [phone]; Fax: [phone]

## 2017-10-31 NOTE — Patient Instructions (Signed)
Medication Instructions:  Your physician recommends that you continue on your current medications as directed. Please refer to the Current Medication list given to you today.  Labwork: Liver and TSH in April  Testing/Procedures: None  Follow-Up: Your physician wants you to follow-up in: 6-8 months with Dr. Tamala Julian.  You will receive a reminder letter in the mail two months in advance. If you don't receive a letter, please call our office to schedule the follow-up appointment.   Any Other Special Instructions Will Be Listed Below (If Applicable).     If you need a refill on your cardiac medications before your next appointment, please call your pharmacy.

## 2017-11-15 DIAGNOSIS — E1122 Type 2 diabetes mellitus with diabetic chronic kidney disease: Secondary | ICD-10-CM | POA: Diagnosis not present

## 2017-11-15 DIAGNOSIS — N183 Chronic kidney disease, stage 3 (moderate): Secondary | ICD-10-CM | POA: Diagnosis not present

## 2017-11-15 DIAGNOSIS — E039 Hypothyroidism, unspecified: Secondary | ICD-10-CM | POA: Diagnosis not present

## 2017-11-15 DIAGNOSIS — N08 Glomerular disorders in diseases classified elsewhere: Secondary | ICD-10-CM | POA: Diagnosis not present

## 2017-11-15 DIAGNOSIS — J019 Acute sinusitis, unspecified: Secondary | ICD-10-CM | POA: Diagnosis not present

## 2017-11-15 DIAGNOSIS — D649 Anemia, unspecified: Secondary | ICD-10-CM | POA: Diagnosis not present

## 2017-11-27 DIAGNOSIS — J310 Chronic rhinitis: Secondary | ICD-10-CM | POA: Diagnosis not present

## 2017-11-27 DIAGNOSIS — J343 Hypertrophy of nasal turbinates: Secondary | ICD-10-CM | POA: Diagnosis not present

## 2017-11-27 DIAGNOSIS — R0982 Postnasal drip: Secondary | ICD-10-CM | POA: Diagnosis not present

## 2017-12-25 ENCOUNTER — Telehealth: Payer: Self-pay | Admitting: Cardiology

## 2017-12-25 ENCOUNTER — Ambulatory Visit (INDEPENDENT_AMBULATORY_CARE_PROVIDER_SITE_OTHER): Payer: Medicare Other | Admitting: *Deleted

## 2017-12-25 DIAGNOSIS — I4901 Ventricular fibrillation: Secondary | ICD-10-CM | POA: Diagnosis not present

## 2017-12-25 NOTE — Telephone Encounter (Signed)
Spoke with pt and reminded pt of remote transmission that is due today. Pt verbalized understanding.   

## 2017-12-26 ENCOUNTER — Encounter: Payer: Self-pay | Admitting: Cardiology

## 2017-12-26 NOTE — Progress Notes (Signed)
Remote ICD transmission.   

## 2018-01-17 LAB — CUP PACEART REMOTE DEVICE CHECK
Battery Remaining Percentage: 77 %
Battery Voltage: 2.98 V
Brady Statistic RA Percent Paced: 6.9 %
HIGH POWER IMPEDANCE MEASURED VALUE: 70 Ohm
HIGH POWER IMPEDANCE MEASURED VALUE: 70 Ohm
Implantable Lead Implant Date: 20170525
Implantable Lead Location: 753858
Implantable Lead Location: 753860
Implantable Lead Model: 7122
Implantable Pulse Generator Implant Date: 20170525
Lead Channel Impedance Value: 440 Ohm
Lead Channel Impedance Value: 710 Ohm
Lead Channel Pacing Threshold Amplitude: 0.625 V
Lead Channel Pacing Threshold Amplitude: 0.75 V
Lead Channel Pacing Threshold Amplitude: 1.125 V
Lead Channel Pacing Threshold Pulse Width: 0.5 ms
Lead Channel Sensing Intrinsic Amplitude: 12 mV
Lead Channel Setting Pacing Amplitude: 2 V
Lead Channel Setting Pacing Amplitude: 2.125
Lead Channel Setting Pacing Pulse Width: 0.5 ms
Lead Channel Setting Pacing Pulse Width: 0.5 ms
Lead Channel Setting Sensing Sensitivity: 0.5 mV
MDC IDC LEAD IMPLANT DT: 20170525
MDC IDC LEAD IMPLANT DT: 20170525
MDC IDC LEAD LOCATION: 753859
MDC IDC MSMT BATTERY REMAINING LONGEVITY: 68 mo
MDC IDC MSMT LEADCHNL LV PACING THRESHOLD PULSEWIDTH: 0.5 ms
MDC IDC MSMT LEADCHNL RA IMPEDANCE VALUE: 450 Ohm
MDC IDC MSMT LEADCHNL RA SENSING INTR AMPL: 2.2 mV
MDC IDC MSMT LEADCHNL RV PACING THRESHOLD PULSEWIDTH: 0.5 ms
MDC IDC PG SERIAL: 7357926
MDC IDC SESS DTM: 20190403042103
MDC IDC SET LEADCHNL RV PACING AMPLITUDE: 2 V
MDC IDC STAT BRADY AP VP PERCENT: 6.9 %
MDC IDC STAT BRADY AP VS PERCENT: 1 %
MDC IDC STAT BRADY AS VP PERCENT: 91 %
MDC IDC STAT BRADY AS VS PERCENT: 1.9 %

## 2018-02-10 ENCOUNTER — Other Ambulatory Visit: Payer: Self-pay | Admitting: Interventional Cardiology

## 2018-03-01 ENCOUNTER — Ambulatory Visit (INDEPENDENT_AMBULATORY_CARE_PROVIDER_SITE_OTHER): Payer: Medicare Other | Admitting: Podiatry

## 2018-03-01 ENCOUNTER — Encounter: Payer: Self-pay | Admitting: Podiatry

## 2018-03-01 VITALS — BP 149/83 | HR 72

## 2018-03-01 DIAGNOSIS — L608 Other nail disorders: Secondary | ICD-10-CM | POA: Diagnosis not present

## 2018-03-01 DIAGNOSIS — B351 Tinea unguium: Secondary | ICD-10-CM

## 2018-03-01 DIAGNOSIS — D689 Coagulation defect, unspecified: Secondary | ICD-10-CM

## 2018-03-01 DIAGNOSIS — M79675 Pain in left toe(s): Secondary | ICD-10-CM | POA: Diagnosis not present

## 2018-03-01 DIAGNOSIS — M79674 Pain in right toe(s): Secondary | ICD-10-CM | POA: Diagnosis not present

## 2018-03-01 DIAGNOSIS — E119 Type 2 diabetes mellitus without complications: Secondary | ICD-10-CM | POA: Diagnosis not present

## 2018-03-01 NOTE — Progress Notes (Signed)
This patient presents to the office with chief complaint of long thick nails and diabetic feet.  This patient  says there  is  no pain and discomfort in his  feet.  This patient says there are long thick ingrown toenails both feet..  These nails are painful walking and wearing shoes. He has been receiving nail care at a salon.  Patient has no history of infection or drainage from both feet.  Patient is unable to  self treat his own nails . This patient presents  to the office today for treatment of the  long nails and a foot evaluation due to history of  Diabetes. Patient is taking eliquiss.  General Appearance  Alert, conversant and in no acute stress.  Vascular  Dorsalis pedis and posterior tibial  pulses are palpable  bilaterally.  Capillary return is within normal limits  bilaterally. Temperature is within normal limits  bilaterally.  Neurologic  Senn-Weinstein monofilament wire test within normal limits  bilaterally. Muscle power within normal limits bilaterally.  Nails Thick disfigured discolored nails with subungual debris  from hallux to fifth toes bilaterally. Significant pincer toenails both feet.  Orthopedic  No limitations of motion of motion feet .  No crepitus or effusions noted.  No bony pathology or digital deformities noted.  Skin  normotropic skin with no porokeratosis noted bilaterally.  No signs of infections or ulcers noted.     Onychomycosis/ Pincer nails   Diabetes with no foot complications  IE  Debride nails x 10.  A diabetic foot exam was performed and there is no evidence of any vascular or neurologic pathology.   RTC 3 months.   Gardiner Barefoot DPM

## 2018-03-26 ENCOUNTER — Ambulatory Visit (INDEPENDENT_AMBULATORY_CARE_PROVIDER_SITE_OTHER): Payer: Medicare Other | Admitting: *Deleted

## 2018-03-26 ENCOUNTER — Telehealth: Payer: Self-pay | Admitting: Cardiology

## 2018-03-26 DIAGNOSIS — R0602 Shortness of breath: Secondary | ICD-10-CM | POA: Diagnosis not present

## 2018-03-26 DIAGNOSIS — I429 Cardiomyopathy, unspecified: Secondary | ICD-10-CM | POA: Diagnosis not present

## 2018-03-26 DIAGNOSIS — I4901 Ventricular fibrillation: Secondary | ICD-10-CM

## 2018-03-26 DIAGNOSIS — N183 Chronic kidney disease, stage 3 (moderate): Secondary | ICD-10-CM | POA: Diagnosis not present

## 2018-03-26 DIAGNOSIS — Z0000 Encounter for general adult medical examination without abnormal findings: Secondary | ICD-10-CM | POA: Diagnosis not present

## 2018-03-26 DIAGNOSIS — E039 Hypothyroidism, unspecified: Secondary | ICD-10-CM | POA: Diagnosis not present

## 2018-03-26 DIAGNOSIS — I130 Hypertensive heart and chronic kidney disease with heart failure and stage 1 through stage 4 chronic kidney disease, or unspecified chronic kidney disease: Secondary | ICD-10-CM | POA: Diagnosis not present

## 2018-03-26 DIAGNOSIS — E1122 Type 2 diabetes mellitus with diabetic chronic kidney disease: Secondary | ICD-10-CM | POA: Diagnosis not present

## 2018-03-26 DIAGNOSIS — N08 Glomerular disorders in diseases classified elsewhere: Secondary | ICD-10-CM | POA: Diagnosis not present

## 2018-03-26 LAB — CBC AND DIFFERENTIAL
HCT: 37 — AB (ref 41–53)
Hemoglobin: 11.8 — AB (ref 13.5–17.5)
WBC: 6.5

## 2018-03-26 LAB — HEMOGLOBIN A1C: HEMOGLOBIN A1C: 6.3 — AB (ref 4.0–6.0)

## 2018-03-26 LAB — TSH: TSH: 2.69 (ref 0.41–5.90)

## 2018-03-26 NOTE — Telephone Encounter (Signed)
Spoke with pt and reminded pt of remote transmission that is due today. Pt verbalized understanding.   

## 2018-03-27 ENCOUNTER — Encounter: Payer: Self-pay | Admitting: Cardiology

## 2018-03-27 NOTE — Progress Notes (Signed)
Remote ICD transmission.   

## 2018-04-02 ENCOUNTER — Telehealth: Payer: Self-pay | Admitting: Interventional Cardiology

## 2018-04-02 DIAGNOSIS — I5022 Chronic systolic (congestive) heart failure: Secondary | ICD-10-CM

## 2018-04-02 NOTE — Telephone Encounter (Signed)
I spoke with patient. He c/o increased weight gain, LE swelling and occasional DOE with walking up stairs that has got worse over the past few weeks. He was seen by his primary MD on 03/26/18. BNP was 1,326 and creatinine 2.11, labs available in epic. Pt is requesting a sooner appt with Dr. Tamala Julian. I informed pt I will send to Dr. Tamala Julian and his nurse and his nurse will call back to follow up. He stated understanding and thankful for the call.

## 2018-04-02 NOTE — Telephone Encounter (Signed)
New message  Patient requesting sooner appt with Dr Tamala Julian.  DOE Leg swelling  1) How much weight have you gained and in what time span? 20-24lbs since last visit  2) If swelling, where is the swelling located? LEGS  3) Are you currently taking a fluid pill? YES  4) Are you currently SOB? DOE  5) Do you have a log of your daily weights (if so, list)?   6) Have you gained 3 pounds in a day or 5 pounds in a week?   Have you traveled recently?NO

## 2018-04-03 NOTE — Telephone Encounter (Signed)
Left message to call back  

## 2018-04-03 NOTE — Telephone Encounter (Signed)
Left another message for pt to call back as I did have opening tomorrow with Daune Perch, NP and was going to offer it to pt to get checked out.  Advised pt to call tomorrow morning and we could possibly still get him in.

## 2018-04-03 NOTE — Telephone Encounter (Signed)
Follow Up:     Pt said he can not come tomorrow morning, only in the afternoon or Friday.

## 2018-04-03 NOTE — Telephone Encounter (Signed)
Obesity increase furosemide to 80 mg/day.  Basic metabolic panel in 1 week.  Weight daily under the same circumstances.  Report weights.

## 2018-04-04 MED ORDER — FUROSEMIDE 80 MG PO TABS: 80.0000 mg | ORAL_TABLET | Freq: Every day | ORAL | 3 refills | Status: DC

## 2018-04-04 NOTE — Telephone Encounter (Signed)
Left message to call back  

## 2018-04-04 NOTE — Telephone Encounter (Signed)
Spoke with pt and went over recommendations per Dr. Tamala Julian.  Pt will come for labs on 7/18.  Pt verbalized understanding and was in agreement with this plan.

## 2018-04-11 ENCOUNTER — Other Ambulatory Visit: Payer: Medicare Other | Admitting: *Deleted

## 2018-04-11 ENCOUNTER — Telehealth: Payer: Self-pay | Admitting: Interventional Cardiology

## 2018-04-11 DIAGNOSIS — I5022 Chronic systolic (congestive) heart failure: Secondary | ICD-10-CM

## 2018-04-11 NOTE — Telephone Encounter (Signed)
Awaiting lab results to make further recommendations concerning fluid retention.

## 2018-04-11 NOTE — Telephone Encounter (Signed)
Pt in today for labs.  Dropped off recent weights  Furosemide was increased to 80mg  QD on 7/10.  7/12- 240lbs 7/13- 236 lbs 7/14- 236lbs 7/15- 237lbs 7/16- 237lbs  Will route to Dr. Tamala Julian for review and advisement.

## 2018-04-12 ENCOUNTER — Encounter: Payer: Self-pay | Admitting: Hematology & Oncology

## 2018-04-12 LAB — BASIC METABOLIC PANEL
BUN / CREAT RATIO: 12 (ref 10–24)
BUN: 23 mg/dL (ref 8–27)
CO2: 22 mmol/L (ref 20–29)
CREATININE: 1.93 mg/dL — AB (ref 0.76–1.27)
Calcium: 8.6 mg/dL (ref 8.6–10.2)
Chloride: 106 mmol/L (ref 96–106)
GFR calc Af Amer: 41 mL/min/{1.73_m2} — ABNORMAL LOW (ref >59)
GFR calc non Af Amer: 35 mL/min/{1.73_m2} — ABNORMAL LOW (ref >59)
GLUCOSE: 93 mg/dL (ref 65–99)
POTASSIUM: 3.9 mmol/L (ref 3.5–5.2)
SODIUM: 141 mmol/L (ref 134–144)

## 2018-04-16 ENCOUNTER — Telehealth: Payer: Self-pay | Admitting: Interventional Cardiology

## 2018-04-16 DIAGNOSIS — I5022 Chronic systolic (congestive) heart failure: Secondary | ICD-10-CM

## 2018-04-16 NOTE — Telephone Encounter (Signed)
Notes recorded by Belva Crome, MD on 04/16/2018 at 1:02 PM EDT Let the patient know he should continue the same therapy. I am concerned about why he developed volume overload. Therefore, a repeat 2D Doppler echocardiogram should be done to exclude worsening of LV function. This should be done before he sees me for an office visit time within the next 1 to 3 weeks.   Ok per DPR to leave detailed message. Left message letting pt know that Dr. Tamala Julian wanted to check echo in the next 1-3 wks.  Advised to call back if any questions, otherwise scheduler will be reaching out to pt with an appt.

## 2018-04-16 NOTE — Telephone Encounter (Signed)
See lab result

## 2018-04-18 ENCOUNTER — Other Ambulatory Visit: Payer: Self-pay

## 2018-04-18 ENCOUNTER — Ambulatory Visit (HOSPITAL_COMMUNITY): Payer: Medicare Other | Attending: Cardiology

## 2018-04-18 DIAGNOSIS — Z95810 Presence of automatic (implantable) cardiac defibrillator: Secondary | ICD-10-CM | POA: Insufficient documentation

## 2018-04-18 DIAGNOSIS — E039 Hypothyroidism, unspecified: Secondary | ICD-10-CM | POA: Insufficient documentation

## 2018-04-18 DIAGNOSIS — N189 Chronic kidney disease, unspecified: Secondary | ICD-10-CM | POA: Insufficient documentation

## 2018-04-18 DIAGNOSIS — I130 Hypertensive heart and chronic kidney disease with heart failure and stage 1 through stage 4 chronic kidney disease, or unspecified chronic kidney disease: Secondary | ICD-10-CM | POA: Diagnosis not present

## 2018-04-18 DIAGNOSIS — I5022 Chronic systolic (congestive) heart failure: Secondary | ICD-10-CM | POA: Diagnosis not present

## 2018-04-18 DIAGNOSIS — E669 Obesity, unspecified: Secondary | ICD-10-CM | POA: Diagnosis not present

## 2018-04-18 DIAGNOSIS — G43909 Migraine, unspecified, not intractable, without status migrainosus: Secondary | ICD-10-CM | POA: Insufficient documentation

## 2018-04-18 DIAGNOSIS — R011 Cardiac murmur, unspecified: Secondary | ICD-10-CM | POA: Diagnosis not present

## 2018-04-18 DIAGNOSIS — I083 Combined rheumatic disorders of mitral, aortic and tricuspid valves: Secondary | ICD-10-CM | POA: Insufficient documentation

## 2018-04-23 NOTE — Progress Notes (Signed)
Cardiology Office Note:    Date:  04/25/2018   ID:  Eric Corning  MD, DOB 13-Mar-1951, MRN [id number]  PCP:  Eric Chard, MD  Cardiologist:  No primary care provider on file.   Referring MD: Eric Chard, MD   Chief Complaint  Patient presents with  . Congestive Heart Failure  . Cardiac Valve Problem    Severe aortic regurgitation    History of Present Illness:    Eric Corning  MD is a 67 y.o. male with a hx of nonischemic cardiomyopathy, hypertension, aortic aneurysm, paroxysmal atrial fibrillation, chronic kidney disease stage III-IV, amiodarone therapy, status post cardiac arrest, status post retroperitoneal hematoma, and recently implanted AICD. Now with recent acute on chronic systolic HF and progression of mild to moderate AR to severe by echo 03/2018.  The patient was initially seen in March 2013 with a greater than 20-year history of hypertension, dyspnea, left bundle branch block, LV EF 30 to 35%, moderate concentric hypertrophy, aortic root enlargement, moderate aortic regurgitation, chronic kidney disease stage III (creatinine of 1.53), obesity, atrial fibrillation (June 2013), and diabetes mellitus type 2.  Esophageal echo performed at the time of cardioversion June 2013 revealed EF 20% mild aortic regurgitation and moderate tricuspid regurgitation.  After eventual control of atrial fibrillation with amiodarone following cardioversion, EF improved to approximately 40% in 2014.  Ascending aorta was noted to be 4.9 cm.  On guideline directed medical therapy for systolic dysfunction chronic kidney disease stage IV (creatinine 2.4) prevented the use of angiotensin receptor/ACE inhibitor therapy.  He developed recurrent atrial arrhythmia including atrial flutter.  He was seen by electrophysiology and the decision made to control his rate after ablation was refused by the patient.  Amiodarone therapy was discontinued.  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  In 2015 creatinine improved to 1.9, he was seen and evaluated by nephrology, felt to be stable.  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  In April 2016, he was stable, exercising, and able to work as an Chief Strategy Officer..    When seen 1 year later in April 2017 he was doing relatively well, still working, and echocardiography was performed to follow-up LV size, function, and severity of aortic regurgitation.  Study performed January 19, 2016 demonstrated LVEF 40 to 45% with diffuse hypokinesis, mild to moderate aortic regurgitation, and mildly dilated aortic root, 48 mm.   In Feb 01, 2016 he suffered ventricular fibrillation cardiac arrest.  He was successfully resuscitated and required prolonged intubation, had transient anoxic encephalopathy, physical deconditioning, and required prolonged rehabilitation.  Transesophageal echocardiography performed Feb 08, 2016 demonstrated EF 30 to 35%, moderate aortic regurgitation, and dilated aortic root (5.2 cm).  Cardiac catheterization 2 weeks after the cardiac arrest revealed a 99% small second diagonal branch but otherwise nonobstructive coronary disease.  LVEDP was increased.  BiVCD was implanted 02/17/2016 and empiric amiodarone therapy.  The hospital stay was complicated by the development of a large retroperitoneal hematoma that led profound weakness and atrophy of the right quadriceps muscle and leg prevented independent ambulation.  Over 6  months [name] gradually improved and returned to work In November 2017.    In May 2018 he was markedly improved, having no difficulty breathing, and there was no evidence of orthopnea.  Therapy included carvedilol, BiDil, furosemide, apixaban, and amiodarone.  Performing a repeat echocardiogram was discussed but never occurred.     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  He denied edema, orthopnea, and  PND.  Was seen by EP and had amiodarone therapy decreased.  On 04/02/2018 [name] called concerning gradual increase in weight since February.  He also noted progressive shortness of breath.  Laboratory data were ordered and revealed creatinine of 2.11 BNP 1326 and diuretic therapy was augmented with brisk diuresis and improvement in dyspnea and edema.  Echocardiogram was ordered and performed on 03/29/2018 demonstrating: Recent in LVEF to 25 to 30%, trileaflet aortic valve, dilated aortic root (42 mm), severe posteriorly directed aortic regurgitation, and increase in LV cavity size at end systole to 60.5 mm and end diastole at 70.1 mm.  He comes in today doing much better.  Weight has decreased significantly.  He denies shortness of breath and has no orthopnea.  He denies chest pain.  He is accompanied by his wife.  After seeing the echocardiogram I discussed the implications of now severe aortic regurgitation with decrease in LV function.  He does note that over the previous months he would occasionally miss a dose of BiDil but apparently all other medications were taken as prescribed including the diuretic.  He is working.  He is able to lie flat in bed without dyspnea.  There is no edema.  Past Medical History:  Diagnosis Date  . CHF (congestive heart failure) ([place])   . Chronic kidney disease    kidney fx studies increased   . Chronic lower back pain   . Claustrophobia   . Dysrhythmia    "palpitations"  . Exertional dyspnea 01/2012  . Heart murmur   . Hypertension   . Hypothyroidism   . Migraine 02/13/12   "opthalmic"  . Varicose vein of leg    right    Past Surgical History:  Procedure Laterality Date  . CARDIAC CATHETERIZATION N/A 02/17/2016   Procedure: Left Heart Cath and Coronary Angiography;  Surgeon: Jettie Booze, MD;  Location: [place] [facility];  Service: Cardiovascular;  Laterality: N/A;  . CARDIOVERSION  03/22/2012   Procedure: CARDIOVERSION;  Surgeon: Candee Furbish,  MD;  Location: [facility] ENDOSCOPY;  Service: Cardiovascular;  Laterality: N/A;  . CARDIOVERSION  04/19/2012   Procedure: CARDIOVERSION;  Surgeon: Sinclair Grooms, MD;  Location: [place];  Service: Cardiovascular;  Laterality: N/A;  . COLONOSCOPY N/A 04/11/2013   Procedure: COLONOSCOPY;  Surgeon: Inda Castle, MD;  Location: [facility] ENDOSCOPY;  Service: Endoscopy;  Laterality: N/A;  . COLONOSCOPY N/A 04/11/2013   Procedure: COLONOSCOPY;  Surgeon: Inda Castle, MD;  Location: [facility] ENDOSCOPY;  Service: Endoscopy;  Laterality: N/A;  . EP IMPLANTABLE DEVICE N/A 02/17/2016   Procedure: BiV ICD Insertion CRT-D;  Surgeon: Evans Lance, MD;  Location: [place] [facility];  Service: Cardiovascular;  Laterality: N/A;  . FINGER SURGERY  2012   "4th digit right hand; thumb on left hand"  . RADIOLOGY WITH ANESTHESIA N/A 02/11/2016   Procedure: MRI OF THE BRAIN WITHOUT CONTRAST, LUMBAR WITHOUT CONTRAST;  Surgeon: Medication Radiologist, MD;  Location: [place];  Service: Radiology;  Laterality: N/A;  DR. WOOD/MRI  . Skin melanocytoma excision  2012   "above left clavicle"  . TEE WITHOUT CARDIOVERSION  03/22/2012   Procedure: TRANSESOPHAGEAL ECHOCARDIOGRAM (TEE);  Surgeon: Candee Furbish, MD;  Location: [facility] ENDOSCOPY;  Service: Cardiovascular;  Laterality: N/A;  . TEE WITHOUT CARDIOVERSION N/A 02/08/2016   Procedure: TRANSESOPHAGEAL ECHOCARDIOGRAM (TEE);  Surgeon: Lelon Perla, MD;  Location: [place];  Service: Cardiovascular;  Laterality:  N/A;    Current Medications: Current Meds  Medication Sig  . acetaminophen (TYLENOL) 500 MG tablet Take 500 mg by mouth every 6 (six) hours as needed.  Marland Kitchen amiodarone (PACERONE) 200 MG tablet Take 200 mg (one tablet) by mouth daily Monday through Saturday.  Do not take on Sunday.  Marland Kitchen apixaban (ELIQUIS) 5 MG TABS tablet Take 1 tablet (5 mg total) by mouth 2 (two) times daily.  . carvedilol (COREG) 25 MG tablet Take 1 tablet (25 mg total) by mouth 2 (two) times daily.  .  cholecalciferol (VITAMIN D) 1000 units tablet Take 1,000 Units by mouth daily.  Marland Kitchen docusate sodium (COLACE) 100 MG capsule Take 100 mg by mouth daily as needed for mild constipation.  . fluticasone (FLONASE) 50 MCG/ACT nasal spray Place 2 sprays into the nose as needed for allergies.   . furosemide (LASIX) 80 MG tablet Take 1 tablet (80 mg total) by mouth daily.  Marland Kitchen gabapentin (NEURONTIN) 100 MG capsule Take 200 mg by mouth at bedtime.  . isosorbide-hydrALAZINE (BIDIL) 20-37.5 MG tablet Take 0.5 tablets by mouth 3 (three) times daily.  Marland Kitchen levothyroxine (SYNTHROID, LEVOTHROID) 75 MCG tablet Take one (1) tablet (75 mcg) by mouth daily before breakfast EXCEPT one Sunday take half (1/2) tablet (37.5 mg) by mouth daily before breakfast.  . oxymetazoline (AFRIN) 0.05 % nasal spray Place 1 spray into both nostrils 2 (two) times daily as needed for congestion.  . rosuvastatin (CRESTOR) 20 MG tablet Take 1 tablet (20 mg total) by mouth daily.  . vitamin B-12 (CYANOCOBALAMIN) 100 MCG tablet Take 100 mcg by mouth daily as needed (energy).   . vitamin C (ASCORBIC ACID) 500 MG tablet Take 500 mg by mouth daily.     Allergies:   Lactose intolerance (gi)   Social History   Socioeconomic History  . Marital status: Married    Spouse name: [name]  . Number of children: 1  . Years of education: Not on file  . Highest education level: Not on file  Occupational History    Employer: [employer]  Social Needs  . Financial resource strain: Not on file  . Food insecurity:    Worry: Not on file    Inability: Not on file  . Transportation needs:    Medical: Not on file    Non-medical: Not on file  Tobacco Use  . Smoking status: Never Smoker  . Smokeless tobacco: Never Used  Substance and Sexual Activity  . Alcohol use: Yes    Comment: 02/13/12 "socially"  . Drug use: No  . Sexual activity: Not Currently  Lifestyle  . Physical activity:    Days per week: Not on file    Minutes per session: Not  on file  . Stress: Not on file  Relationships  . Social connections:    Talks on phone: Not on file    Gets together: Not on file    Attends religious service: Not on file    Active member of club or organization: Not on file    Attends meetings of clubs or organizations: Not on file    Relationship status: Not on file  Other Topics Concern  . 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     Family History: The patient's family history includes Diabetes in his mother; Heart disease in his father and mother; Heart failure in his father and mother; Hypertension  in his father and mother.  ROS:   Please see the history of present illness.    Unexplained weight gain, lower extremity swelling, dyspnea, progressive over the last 3 to 4 months.  All other systems reviewed and are negative.  EKGs/Labs/Other Studies Reviewed:    The following studies were reviewed today:  2D Doppler echocardiogram July 2019: Study Conclusions  - Left ventricle: The cavity size was severely dilated. Wall   thickness was normal. Systolic function was severely reduced. The   estimated ejection fraction was in the range of 25% to 30%.   Diffuse hypokinesis. Grade 3 (restrictive) diastolic function   with high LV filling pressure. - Aortic valve: Trileaflet. Sclerosis without stenosis. There was   severe, posteriorly-directed regurgitation toward the anterior   mitral leaflet. Regurgitation pressure half-time: 314 ms. - Aorta: Dilated aorta. Aortic root dimension: 49 mm (ED).   Ascending aortic diameter: 42 mm (S). - Mitral valve: Mildly thickened leaflets . There was mild   regurgitation. - Left atrium: Severely dilated. - Right ventricle: AICD wire noted in right ventricle. - Right atrium: Severely dilated. AICD wire noted in right atrium. - Tricuspid valve: There was mild regurgitation. - Pulmonary arteries: PA peak pressure:  44 mm Hg (S). - Inferior vena cava: The vessel was dilated. The respirophasic   diameter changes were blunted (< 50%), consistent with elevated   central venous pressure.  Impressions:  - Compared to a prior study in 2017, there is now likely severe AI.   The LVEF is lower at 25-30% and the LV cavity is severely dilated   at 7 cm. Consider further evaluation of AI by TEE and evaluation   for aortic valve replacement.   Echocardiogram 02/01/2016: Study Conclusions  - Left ventricle: The cavity size was severely dilated. There was   mild focal basal hypertrophy of the septum. Systolic function was   moderately to severely reduced. The estimated ejection fraction   was in the range of 30% to 35%. Diffuse hypokinesis. Features are   consistent with a pseudonormal left ventricular filling pattern,   with concomitant abnormal relaxation and increased filling   pressure (grade 2 diastolic dysfunction). Doppler parameters are   consistent with elevated ventricular end-diastolic filling   pressure. - Ventricular septum: Septal motion showed abnormal function and   dyssynergy. - Aortic valve: There was severe regurgitation. - Aortic root: The aortic root was dilated measurinh 49 mm. - Ascending aorta: The ascending aorta was dilated measuring 43 mm. - Left atrium: The atrium was moderately dilated. - Right ventricle: The cavity size was normal. Wall thickness was   normal. Systolic function was moderately reduced. - Right atrium: The atrium was normal in size. - Tricuspid valve: There was mild regurgitation. - Pulmonic valve: There was moderate regurgitation. - Pulmonary arteries: Systolic pressure was within the normal   range. PA peak pressure: 32 mm Hg (S). - Inferior vena cava: The vessel was normal in size. - Pericardium, extracardiac: There was no pericardial effusion.   There was a left pleural effusion.  Impressions:  - Dilated aortc root, with bicuspid aortic valve and  severe aortic   regurgitation with eccentric jet. Mildly dilated ascending aorta.    EKG:  EKG is  ordered today.  The ekg ordered today demonstrates atrial fibrillation with ventricular pacing.  Prior EKG performed May 2018 reveals sinus rhythm with ventricular pacing.  Pacemaker interrogation reports suggested atrial fibrillation develop within the past 3 to 4 months.  Recent Labs: 04/11/2018: BUN  23; Creatinine, Ser 1.93; Potassium 3.9; Sodium 141  Recent Lipid Panel    Component Value Date/Time   CHOL 163 12/27/2016 0848   TRIG 72 12/27/2016 0848   HDL 64 12/27/2016 0848   CHOLHDL 2.5 12/27/2016 0848   CHOLHDL 3.8 02/13/2012 1829   VLDL 30 02/13/2012 1829   LDLCALC 85 12/27/2016 0848    Physical Exam:    VS:  BP 130/78   Pulse 70   Ht 5\' 9"  (1.753 m)   Wt 236 lb 12.8 oz (107.4 kg)   BMI 34.97 kg/m     Wt Readings from Last 3 Encounters:  04/25/18 236 lb 12.8 oz (107.4 kg)  10/31/17 242 lb 12.8 oz (110.1 kg)  06/18/17 240 lb 12.8 oz (109.2 kg)     GEN: Obese, well developed in no acute distress HEENT: Normal NECK: No JVD. LYMPHATICS: No lymphadenopathy CARDIAC: RRR, soft 1/6 to 2/6 systolic and 2/6 decrescendo low pitched holodiastolic murmur of aortic regurgitation.  Soft S3 gallop, no edema. VASCULAR: 2+ radial pulses.  No bruits. RESPIRATORY:  Clear to auscultation without rales, wheezing or rhonchi  ABDOMEN: Soft, non-tender, non-distended, No pulsatile mass, MUSCULOSKELETAL: No deformity  SKIN: Warm and dry NEUROLOGIC:  Alert and oriented x 3 PSYCHIATRIC:  Normal affect   ASSESSMENT:    1. Acute on chronic systolic heart failure ([place])   2. Severe aortic regurgitation   3. Aortic root enlargement ([place])   4. Atypical atrial flutter ([place])   5. Essential hypertension   6. Long term (current) use of anticoagulants   7. Long term current use of amiodarone   8. CKD (chronic kidney disease), stage IV ([place])   9. Chronic anticoagulation   10. Ventricular  fibrillation ([place])   11. Diabetes mellitus type 2 in nonobese [facility])    PLAN:    In order of problems listed above:  1. Gradual development over the past 4 to 6 months of recurrent symptomatic systolic heart failure with dramatic improvement from diuresis.  Updated echo reveals decline in LV function, increased LV cavity size, and now severe aortic regurgitation (2 years ago noted to be mild to moderate). 2. Progression of aortic regurgitation has occurred over the past 24 months to severe and is documented by the most recent echocardiogram. 3. The root is enlarged but has been relatively stable in size based on echo.  Echo sizes up to 5.2 cm have been documented in the past.  Most recent ascending aortic diameter is 4.9 cm. 4. Recurrent atrial arrhythmia, most compatible with atrial fibrillation on today's EKG.  There is a possibility that loss of atrial kick has contributed to the gradual development of volume overload and CHF. 5. Blood pressure is under reasonable control.  I discussed the importance of taking BiDil as directed, twice daily and possibly increasing to 3 times daily. 6. No bleeding complications on apixaban therapy. 7. Continue current amiodarone dose.  Will speak with EP to determine if therapy should be continued now that he is back in sinus rhythm.  Amiodarone was started after ventricular fibrillation cardiac arrest and to control atrial rhythm/rate while intubated. 8. Kidney function will be reassessed today. 9. Continue apixaban and consider dose adjustment depending upon kidney function.  Overall clinical picture is very disturbing.  The left ventricle which has been chronically dilated and hypocontractile for greater than 6 years has deteriorated with progression of LV enlargement and decrease in LVEF.  Aortic regurgitation is now categorized as severe.  Suspect regurgitation has gotten worse  due to progressive aortic root enlargement despite the measurements noted.  The  patient and wife have been informed that we should consider treatment options to relieve the increased volume burden on the heart related to aortic regurgitation.  This will require surgical aortic valve replacement and likely root replacement. TAVR is not a standard treatment option for severe aortic regurgitation.  He prefers to a major heart center such as [facility] or [facility] in [place].  We all agree to start making arrangements.  He needs further work-up which will likely include a repeat transesophageal echo, repeat heart catheterization and coronary angiography, as well as MRI or CT angiography.  We will leave this to be done at the referral center to avoid duplication of studies.  Also informed aortic valve and root replacement would be a high risk surgical procedure and that there are no guarantees that surgery is an acceptable treatment option at this time.  We should seek further definition of our treatment options as quickly as possible.    Medication Adjustments/Labs and Tests Ordered: Current medicines are reviewed at length with the patient today.  Concerns regarding medicines are outlined above.  Orders Placed This Encounter  Procedures  . Basic metabolic panel  . CBC  . EKG 12-Lead   No orders of the defined types were placed in this encounter.   Patient Instructions  Medication Instructions:  Your physician recommends that you continue on your current medications as directed. Please refer to the Current Medication list given to you today.  Labwork: BMET and CBC today   Testing/Procedures: None  Follow-Up: Your physician recommends that you schedule a follow-up appointment in: 2 months with Dr. Tamala Julian (Can have next available in October.)   Any Other Special Instructions Will Be Listed Below (If Applicable).  Please contact the office once you decide where you would like to go for Valve consult.    If you need a refill on your cardiac medications before  your next appointment, please call your pharmacy.      Signed, Sinclair Grooms, MD  04/25/2018 7:59 PM    [facility]

## 2018-04-24 LAB — CUP PACEART REMOTE DEVICE CHECK
Brady Statistic AP VP Percent: 6.2 %
Brady Statistic AP VS Percent: 1 %
Brady Statistic AS VS Percent: 2.1 %
Brady Statistic RA Percent Paced: 6.5 %
Date Time Interrogation Session: 20190703162757
HIGH POWER IMPEDANCE MEASURED VALUE: 68 Ohm
HighPow Impedance: 68 Ohm
Implantable Lead Implant Date: 20170525
Implantable Lead Implant Date: 20170525
Implantable Lead Location: 753858
Implantable Lead Location: 753859
Implantable Lead Model: 7122
Implantable Pulse Generator Implant Date: 20170525
Lead Channel Impedance Value: 460 Ohm
Lead Channel Pacing Threshold Amplitude: 0.5 V
Lead Channel Pacing Threshold Amplitude: 0.75 V
Lead Channel Pacing Threshold Amplitude: 1.125 V
Lead Channel Pacing Threshold Pulse Width: 0.5 ms
Lead Channel Pacing Threshold Pulse Width: 0.5 ms
Lead Channel Sensing Intrinsic Amplitude: 0.5 mV
Lead Channel Setting Pacing Amplitude: 2 V
Lead Channel Setting Pacing Amplitude: 2.125
Lead Channel Setting Pacing Pulse Width: 0.5 ms
Lead Channel Setting Pacing Pulse Width: 0.5 ms
MDC IDC LEAD IMPLANT DT: 20170525
MDC IDC LEAD LOCATION: 753860
MDC IDC MSMT BATTERY REMAINING LONGEVITY: 66 mo
MDC IDC MSMT BATTERY REMAINING PERCENTAGE: 74 %
MDC IDC MSMT BATTERY VOLTAGE: 2.98 V
MDC IDC MSMT LEADCHNL LV IMPEDANCE VALUE: 660 Ohm
MDC IDC MSMT LEADCHNL RV IMPEDANCE VALUE: 440 Ohm
MDC IDC MSMT LEADCHNL RV PACING THRESHOLD PULSEWIDTH: 0.5 ms
MDC IDC MSMT LEADCHNL RV SENSING INTR AMPL: 11.8 mV
MDC IDC SET LEADCHNL RA PACING AMPLITUDE: 2 V
MDC IDC SET LEADCHNL RV SENSING SENSITIVITY: 0.5 mV
MDC IDC STAT BRADY AS VP PERCENT: 92 %
Pulse Gen Serial Number: 7357926

## 2018-04-25 ENCOUNTER — Ambulatory Visit (INDEPENDENT_AMBULATORY_CARE_PROVIDER_SITE_OTHER): Payer: Medicare Other | Admitting: Interventional Cardiology

## 2018-04-25 ENCOUNTER — Encounter: Payer: Self-pay | Admitting: Interventional Cardiology

## 2018-04-25 VITALS — BP 130/78 | HR 70 | Ht 69.0 in | Wt 236.8 lb

## 2018-04-25 DIAGNOSIS — N184 Chronic kidney disease, stage 4 (severe): Secondary | ICD-10-CM | POA: Diagnosis not present

## 2018-04-25 DIAGNOSIS — E119 Type 2 diabetes mellitus without complications: Secondary | ICD-10-CM | POA: Diagnosis not present

## 2018-04-25 DIAGNOSIS — I4901 Ventricular fibrillation: Secondary | ICD-10-CM | POA: Diagnosis not present

## 2018-04-25 DIAGNOSIS — I484 Atypical atrial flutter: Secondary | ICD-10-CM | POA: Diagnosis not present

## 2018-04-25 DIAGNOSIS — I5023 Acute on chronic systolic (congestive) heart failure: Secondary | ICD-10-CM

## 2018-04-25 DIAGNOSIS — I10 Essential (primary) hypertension: Secondary | ICD-10-CM | POA: Diagnosis not present

## 2018-04-25 DIAGNOSIS — Z79899 Other long term (current) drug therapy: Secondary | ICD-10-CM | POA: Diagnosis not present

## 2018-04-25 DIAGNOSIS — I7789 Other specified disorders of arteries and arterioles: Secondary | ICD-10-CM

## 2018-04-25 DIAGNOSIS — Z7901 Long term (current) use of anticoagulants: Secondary | ICD-10-CM | POA: Diagnosis not present

## 2018-04-25 DIAGNOSIS — I351 Nonrheumatic aortic (valve) insufficiency: Secondary | ICD-10-CM

## 2018-04-25 NOTE — Patient Instructions (Signed)
Medication Instructions:  Your physician recommends that you continue on your current medications as directed. Please refer to the Current Medication list given to you today.  Labwork: BMET and CBC today   Testing/Procedures: None  Follow-Up: Your physician recommends that you schedule a follow-up appointment in: 2 months with Dr. Tamala Julian (Can have next available in October.)   Any Other Special Instructions Will Be Listed Below (If Applicable).  Please contact the office once you decide where you would like to go for Valve consult.    If you need a refill on your cardiac medications before your next appointment, please call your pharmacy.

## 2018-04-26 ENCOUNTER — Telehealth: Payer: Self-pay

## 2018-04-26 LAB — BASIC METABOLIC PANEL
BUN/Creatinine Ratio: 10 (ref 10–24)
BUN: 20 mg/dL (ref 8–27)
CALCIUM: 8.9 mg/dL (ref 8.6–10.2)
CO2: 22 mmol/L (ref 20–29)
Chloride: 104 mmol/L (ref 96–106)
Creatinine, Ser: 1.93 mg/dL — ABNORMAL HIGH (ref 0.76–1.27)
GFR calc Af Amer: 41 mL/min/{1.73_m2} — ABNORMAL LOW (ref >59)
GFR, EST NON AFRICAN AMERICAN: 35 mL/min/{1.73_m2} — AB (ref >59)
Glucose: 109 mg/dL — ABNORMAL HIGH (ref 65–99)
POTASSIUM: 3.6 mmol/L (ref 3.5–5.2)
Sodium: 141 mmol/L (ref 134–144)

## 2018-04-26 LAB — CBC
HEMOGLOBIN: 11.7 g/dL — AB (ref 13.0–17.7)
Hematocrit: 36.9 % — ABNORMAL LOW (ref 37.5–51.0)
MCH: 29.9 pg (ref 26.6–33.0)
MCHC: 31.7 g/dL (ref 31.5–35.7)
MCV: 94 fL (ref 79–97)
Platelets: 268 10*3/uL (ref 150–450)
RBC: 3.91 x10E6/uL — AB (ref 4.14–5.80)
RDW: 15.6 % — ABNORMAL HIGH (ref 12.3–15.4)
WBC: 6.1 10*3/uL (ref 3.4–10.8)

## 2018-04-26 NOTE — Telephone Encounter (Signed)
LMOVM reminding pt to send remote transmission.   

## 2018-05-02 ENCOUNTER — Inpatient Hospital Stay: Payer: Medicare Other | Attending: Hematology & Oncology | Admitting: Hematology & Oncology

## 2018-05-02 ENCOUNTER — Inpatient Hospital Stay: Payer: Medicare Other

## 2018-05-08 DIAGNOSIS — R29898 Other symptoms and signs involving the musculoskeletal system: Secondary | ICD-10-CM | POA: Insufficient documentation

## 2018-05-09 DIAGNOSIS — I712 Thoracic aortic aneurysm, without rupture: Secondary | ICD-10-CM | POA: Diagnosis not present

## 2018-05-09 DIAGNOSIS — I351 Nonrheumatic aortic (valve) insufficiency: Secondary | ICD-10-CM | POA: Diagnosis not present

## 2018-05-09 DIAGNOSIS — I428 Other cardiomyopathies: Secondary | ICD-10-CM | POA: Diagnosis not present

## 2018-05-17 ENCOUNTER — Encounter: Payer: Self-pay | Admitting: Podiatry

## 2018-05-17 ENCOUNTER — Ambulatory Visit (INDEPENDENT_AMBULATORY_CARE_PROVIDER_SITE_OTHER): Payer: Medicare Other | Admitting: Podiatry

## 2018-05-17 DIAGNOSIS — E119 Type 2 diabetes mellitus without complications: Secondary | ICD-10-CM | POA: Diagnosis not present

## 2018-05-17 DIAGNOSIS — M79674 Pain in right toe(s): Secondary | ICD-10-CM | POA: Diagnosis not present

## 2018-05-17 DIAGNOSIS — B351 Tinea unguium: Secondary | ICD-10-CM

## 2018-05-17 DIAGNOSIS — D689 Coagulation defect, unspecified: Secondary | ICD-10-CM

## 2018-05-17 DIAGNOSIS — M79675 Pain in left toe(s): Secondary | ICD-10-CM | POA: Diagnosis not present

## 2018-05-17 DIAGNOSIS — L608 Other nail disorders: Secondary | ICD-10-CM

## 2018-05-17 NOTE — Progress Notes (Signed)
Complaint:  Visit Type: Patient returns to my office for continued preventative foot care services. Complaint: Patient states" my nails have grown long and thick and become painful to walk and wear shoes" Patient has been diagnosed with DM with no foot complications. The patient presents for preventative foot care services. No changes to ROS.  Patient is taking eliquiss.  Podiatric Exam: Vascular: dorsalis pedis and posterior tibial pulses are palpable bilateral. Capillary return is immediate. Temperature gradient is WNL. Skin turgor WNL  Sensorium: Normal Semmes Weinstein monofilament test. Normal tactile sensation bilaterally. Nail Exam: Pt has thick disfigured discolored nails with subungual debris noted bilateral entire nail hallux through fifth toenails.  Pincer nails noted. Ulcer Exam: There is no evidence of ulcer or pre-ulcerative changes or infection. Orthopedic Exam: Muscle tone and strength are WNL. No limitations in general ROM. No crepitus or effusions noted. Foot type and digits show no abnormalities. Bony prominences are unremarkable. Skin: No Porokeratosis. No infection or ulcers  Diagnosis:  Onychomycosis, , Pain in right toe, pain in left toes Pincer nails    Treatment & Plan Procedures and Treatment: Consent by patient was obtained for treatment procedures.   Debridement of mycotic and hypertrophic toenails, 1 through 5 bilateral and clearing of subungual debris. No ulceration, no infection noted.  Return Visit-Office Procedure: Patient instructed to return to the office for a follow up visit 3 months for continued evaluation and treatment.    Gardiner Barefoot DPM

## 2018-06-04 ENCOUNTER — Telehealth: Payer: Self-pay | Admitting: *Deleted

## 2018-06-04 NOTE — Telephone Encounter (Signed)
Left message to call back  

## 2018-06-04 NOTE — Telephone Encounter (Signed)
-----   Message from Belva Crome, MD sent at 06/04/2018  1:37 PM EDT ----- Regarding: Aortic Valve Surgery Find out where we stand with scheduling cath in preparation for AVR surgery?

## 2018-06-06 NOTE — Telephone Encounter (Signed)
Follow Up:      Returning your call from today. 

## 2018-06-06 NOTE — Telephone Encounter (Signed)
Spoke with pt and he states he had his initial consult at [facility] last month.  Dr. Evelina Dun wants pt to have PFTs, R/L heart cath and CT angio of chest including the brachiocephalic trunks.  Consult note and recommendations can be seen in Care Everywhere under documents tab and then Initial consult.  Pt would like to have these tests done through our office.  He has not reached out at this point to get these tests because he has been dealing with procedures for his wife first.  Pt going out of town in the next couple of weeks.  He would like to start these tests around the beginning of October so they can plan to have his valve surgery within a month of testing.  Advised I will send message to Dr. Tamala Julian to make him aware.  Also advised pt to contact me when he was ready to proceed with testing.  Pt appreciative for call.

## 2018-06-06 NOTE — Telephone Encounter (Signed)
Left message to call back  

## 2018-06-06 NOTE — Telephone Encounter (Signed)
Thanks

## 2018-06-25 ENCOUNTER — Telehealth: Payer: Self-pay | Admitting: Cardiology

## 2018-06-25 ENCOUNTER — Encounter: Payer: Medicare Other | Admitting: *Deleted

## 2018-06-25 NOTE — Telephone Encounter (Signed)
Spoke with pt and reminded pt of remote transmission that is due today. Pt verbalized understanding.   

## 2018-06-27 ENCOUNTER — Encounter: Payer: Self-pay | Admitting: Cardiology

## 2018-07-03 ENCOUNTER — Telehealth: Payer: Self-pay | Admitting: Interventional Cardiology

## 2018-07-03 ENCOUNTER — Ambulatory Visit: Payer: Medicare Other | Admitting: Interventional Cardiology

## 2018-07-03 DIAGNOSIS — I5022 Chronic systolic (congestive) heart failure: Secondary | ICD-10-CM

## 2018-07-03 DIAGNOSIS — I351 Nonrheumatic aortic (valve) insufficiency: Secondary | ICD-10-CM

## 2018-07-03 DIAGNOSIS — I712 Thoracic aortic aneurysm, without rupture, unspecified: Secondary | ICD-10-CM

## 2018-07-03 DIAGNOSIS — N184 Chronic kidney disease, stage 4 (severe): Secondary | ICD-10-CM

## 2018-07-03 DIAGNOSIS — R0602 Shortness of breath: Secondary | ICD-10-CM

## 2018-07-03 NOTE — Telephone Encounter (Signed)
New Message    Patient is returning call to [facility] from a conversation that they had over a week ago. He is aware that Anderson Malta is not in and will wait for her call since she know the situation. Please call.

## 2018-07-04 MED ORDER — DIAZEPAM 5 MG PO TABS: ORAL_TABLET | ORAL | 0 refills | Status: DC

## 2018-07-04 NOTE — Telephone Encounter (Signed)
Pt scheduled for CT on 10/18.  Pt requested something for claustrophobia for CT.  Dr. Tamala Julian said ok to give Valium 5mg  1x dose.  Spoke with pt and advised him to pick up Valium prescription when he comes for labs on the 15th.

## 2018-07-04 NOTE — Telephone Encounter (Signed)
Spoke with pt and he is ready move forward with workup for AVR.  Pt would like to do cath on 11/1.  Advised I will place orders for CT and PFTs as well and someone will contact him to schedule those.  Pt has appt 10/18 with Dr. Tamala Julian.  Advised we will go over instructions for all tests at that time.  Pt verbalized understanding and was in agreement with this plan.

## 2018-07-05 ENCOUNTER — Other Ambulatory Visit: Payer: Self-pay | Admitting: Internal Medicine

## 2018-07-05 DIAGNOSIS — I712 Thoracic aortic aneurysm, without rupture: Secondary | ICD-10-CM | POA: Diagnosis not present

## 2018-07-08 ENCOUNTER — Ambulatory Visit (INDEPENDENT_AMBULATORY_CARE_PROVIDER_SITE_OTHER): Payer: Medicare Other | Admitting: *Deleted

## 2018-07-08 DIAGNOSIS — I4901 Ventricular fibrillation: Secondary | ICD-10-CM

## 2018-07-09 ENCOUNTER — Other Ambulatory Visit: Payer: Medicare Other

## 2018-07-09 DIAGNOSIS — N184 Chronic kidney disease, stage 4 (severe): Secondary | ICD-10-CM | POA: Diagnosis not present

## 2018-07-09 DIAGNOSIS — I5022 Chronic systolic (congestive) heart failure: Secondary | ICD-10-CM

## 2018-07-09 NOTE — Progress Notes (Signed)
Remote ICD transmission.   

## 2018-07-10 LAB — BASIC METABOLIC PANEL
BUN/Creatinine Ratio: 13 (ref 10–24)
BUN: 25 mg/dL (ref 8–27)
CALCIUM: 8.8 mg/dL (ref 8.6–10.2)
CO2: 22 mmol/L (ref 20–29)
CREATININE: 1.88 mg/dL — AB (ref 0.76–1.27)
Chloride: 104 mmol/L (ref 96–106)
GFR calc Af Amer: 42 mL/min/{1.73_m2} — ABNORMAL LOW (ref >59)
GFR, EST NON AFRICAN AMERICAN: 36 mL/min/{1.73_m2} — AB (ref >59)
GLUCOSE: 123 mg/dL — AB (ref 65–99)
Potassium: 3.7 mmol/L (ref 3.5–5.2)
SODIUM: 140 mmol/L (ref 134–144)

## 2018-07-11 ENCOUNTER — Encounter: Payer: Self-pay | Admitting: Cardiology

## 2018-07-11 NOTE — H&P (View-Only) (Signed)
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   Referring MD: Glendale Chard, MD   Chief Complaint  Patient presents with  . Atrial Fibrillation  . 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 Now with recent acute on chronic systolic HF and progression of mild to moderate AR to severe by echo 03/2018.  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  He has been taking diuretic therapy as recommended.  No lower extremity swelling, orthopnea, PND has been noted.  He came to the office today to have an aortic CT angiogram performed as part of his work-up/preparation for aortic valve replacement and root replacement.  He denies chest pain.  He received 80 cc of contrast today but because of his severe aortic regurgitation the trigger in the descending aorta never reached the Hounsfield units required to initiate scanning.  Therefore he received contrast without any imaging being performed and because of his kidney function that shows against re-administering contrast.  The study will need to be repeated once we determine how his kidneys handle the contrast load administered today.  The protocol on the next occasion will be 100 cc of contrast with triggering in the arch of the aorta.  Past Medical History:  Diagnosis Date  . CHF (congestive heart failure) ([place])   . Chronic kidney disease    kidney fx studies increased   . Chronic lower back pain   . Claustrophobia   . Dysrhythmia    "palpitations"  . Exertional dyspnea 01/2012  . Heart murmur   . Hypertension   .  Hypothyroidism   . Migraine 02/13/12   "opthalmic"  . Varicose vein of leg    right    Past Surgical History:  Procedure Laterality Date  . CARDIAC CATHETERIZATION N/A 02/17/2016   Procedure: Left Heart Cath and Coronary Angiography;  Surgeon: Jettie Booze, MD;  Location: [facility];  Service: Cardiovascular;  Laterality: N/A;  . CARDIOVERSION  03/22/2012   Procedure: CARDIOVERSION;  Surgeon: Candee Furbish, MD;  Location[facility] END[facility];  Service: Cardiovascular;  Laterality: N/A;  . CARDIOVERSION  04/19/2012   Procedure: CARDIOVERSION;  Surgeon: Sinclair Grooms, MD;  Location: [facility];  Service: Cardiovascular;  Laterality: N/A;  . COLONOSCOPY N/A 04/11/2013   Procedure: COLONOSCOPY;  Surgeon: Inda Castle, MD;  Location: [facility] ENDOSCOPY;  Service: Endoscopy;  Laterality: N/A;  . COLONOSCOPY N/A 04/11/2013   Procedure: COLONOSCOPY;  Surgeon: Inda Castle, MD;  Location: [facility] ENDOSCOPY;  Service: Endoscopy;  Laterality: N/A;  . EP IMPLANTABLE DEVICE N/A 02/17/2016   Procedure: BiV ICD Insertion CRT-D;  Surgeon: Evans Lance, MD;  Location: [facility];  Service: Cardiovascular;  Laterality: N/A;  . FINGER SURGERY  2012   "4th digit right hand; thumb on left hand"  . RADIOLOGY WITH ANESTHESIA N/A 02/11/2016   Procedure: MRI OF THE BRAIN WITHOUT CONTRAST, LUMBAR WITHOUT CONTRAST;  Surgeon: Medication Radiologist, MD;  Location: [facility];  Service: Radiology;  Laterality: N/A;  DR. WOOD/MRI  . Skin melanocytoma excision  2012   "  above left clavicle"  . TEE WITHOUT CARDIOVERSION  03/22/2012   Procedure: TRANSESOPHAGEAL ECHOCARDIOGRAM (TEE);  Surgeon: Candee Furbish, MD;  Location: [facility] ENDOSCOPY;  Service: Cardiovascular;  Laterality: N/A;  . TEE WITHOUT CARDIOVERSION N/A 02/08/2016   Procedure: TRANSESOPHAGEAL ECHOCARDIOGRAM (TEE);  Surgeon: Lelon Perla, MD;  Location: [facility] ENDOSCOPY;  Service: Cardiovascular;  Laterality: N/A;    Current Medications: Current Meds  Medication  Sig  . acetaminophen (TYLENOL) 500 MG tablet Take 500 mg by mouth every 6 (six) hours as needed.  Marland Kitchen amiodarone (PACERONE) 200 MG tablet Take 200 mg (one tablet) by mouth daily Monday through Saturday.  Do not take on Sunday.  Marland Kitchen apixaban (ELIQUIS) 5 MG TABS tablet Take 1 tablet (5 mg total) by mouth 2 (two) times daily.  . carvedilol (COREG) 25 MG tablet Take 1 tablet (25 mg total) by mouth 2 (two) times daily.  . cholecalciferol (VITAMIN D) 1000 units tablet Take 1,000 Units by mouth daily.  . diazepam (VALIUM) 5 MG tablet Take 1 tablet (5mg  total) 1 hour prior to CT  . docusate sodium (COLACE) 100 MG capsule Take 100 mg by mouth daily as needed for mild constipation.  . fluticasone (FLONASE) 50 MCG/ACT nasal spray Place 2 sprays into the nose as needed for allergies.   . furosemide (LASIX) 80 MG tablet Take 1 tablet (80 mg total) by mouth daily.  Marland Kitchen gabapentin (NEURONTIN) 100 MG capsule Take 200 mg by mouth at bedtime.  . isosorbide-hydrALAZINE (BIDIL) 20-37.5 MG tablet Take 0.5 tablets by mouth 3 (three) times daily.  Marland Kitchen levothyroxine (SYNTHROID, LEVOTHROID) 75 MCG tablet TAKE 1 TABLET BY MOUTH DAILY BEFORE BREAKFAST  . oxymetazoline (AFRIN) 0.05 % nasal spray Place 1 spray into both nostrils 2 (two) times daily as needed for congestion.  . rosuvastatin (CRESTOR) 20 MG tablet Take 1 tablet (20 mg total) by mouth daily.  . vitamin B-12 (CYANOCOBALAMIN) 100 MCG tablet Take 100 mcg by mouth daily as needed (energy).   . vitamin C (ASCORBIC ACID) 500 MG tablet Take 500 mg by mouth daily.     Allergies:   Lactose and Lactose intolerance (gi)   Social History   Socioeconomic History  . Marital status: Married    Spouse name: Eric [name]  . Number of children: 1  . Years of education: Not on file  . Highest education level: Not on file  Occupational History    Employer: [employer]  Social Needs  . Financial resource strain: Not on file  . Food insecurity:    Worry: Not on file     Inability: Not on file  . Transportation needs:    Medical: Not on file    Non-medical: Not on file  Tobacco Use  . Smoking status: Never Smoker  . Smokeless tobacco: Never Used  Substance and Sexual Activity  . Alcohol use: Yes    Comment: 02/13/12 "socially"  . Drug use: No  . Sexual activity: Not Currently  Lifestyle  . Physical activity:    Days per week: Not on file    Minutes per session: Not on file  . Stress: Not on file  Relationships  . Social connections:    Talks on phone: Not on file    Gets together: Not on file    Attends religious service: Not on file    Active member of club or organization: Not on file    Attends meetings of clubs or organizations: Not on file    Relationship status: Not  on file  Other Topics Concern  . 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     Family History: The patient's family history includes Diabetes in his mother; Heart disease in his father and mother; Heart failure in his father and mother; Hypertension in his father and mother.  ROS:   Please see the history of present illness.    Occasional abdominal discomfort and occasional chills.  Otherwise no complaints.  All other systems reviewed and are negative.  EKGs/Labs/Other Studies Reviewed:    The following studies were reviewed today: Data.  EKG:  EKG is  ordered today.  The ekg ordered today demonstrates ventricular paced rhythm with apparent tracking although difficult to tell and there easily could be and probably is underlying atrial fibrillation.  Recent Labs: 04/25/2018: Hemoglobin 11.7; Platelets 268 07/09/2018: BUN 25; Creatinine, Ser 1.88; Potassium 3.7; Sodium 140  Recent Lipid Panel    Component Value Date/Time   CHOL 163 12/27/2016 0848   TRIG 72 12/27/2016 0848   HDL 64 12/27/2016 0848   CHOLHDL 2.5 12/27/2016 0848   CHOLHDL 3.8 02/13/2012 1829   VLDL 30 02/13/2012 1829    LDLCALC 85 12/27/2016 0848    Physical Exam:    VS:  BP 138/80   Pulse 83   Ht 5\' 9"  (1.753 m)   Wt 248 lb (112.5 kg)   BMI 36.62 kg/m     Wt Readings from Last 3 Encounters:  07/12/18 248 lb (112.5 kg)  04/25/18 236 lb 12.8 oz (107.4 kg)  10/31/17 242 lb 12.8 oz (110.1 kg)     GEN: Obese, mostly truncal.  Well nourished, well developed in no acute distress HEENT: Normal NECK: Distention with the patient lying at 45 degrees.Marland Kitchen LYMPHATICS: No lymphadenopathy CARDIAC: RRR, 606 holodiastolic decrescendo aortic regurgitation murmur, no gallop, no peripheral edema. VASCULAR: 2+ bilateral radial pulses.  No bruits. RESPIRATORY:  Clear to auscultation without rales, wheezing or rhonchi  ABDOMEN: Soft, non-tender, non-distended, No pulsatile mass, MUSCULOSKELETAL: No deformity  SKIN: Warm and dry NEUROLOGIC:  Alert and oriented x 3 PSYCHIATRIC:  Normal affect   ASSESSMENT:    1. Severe aortic regurgitation   2. Chronic systolic heart failure ([place])   3. Essential hypertension   4. CKD (chronic kidney disease), stage IV ([place])   5. Long term (current) use of anticoagulants   6. Atypical atrial flutter ([place])   7. Diabetes mellitus type 2 in nonobese (HCC)   8. Left bundle branch block    PLAN:    In order of problems listed above:  1. Severe aortic regurgitation and known aortic root enlargement in need of surgical repair.  He is consented to undergo left and right heart catheterization in preparation for referral back to [facility] for surgical therapy.  He preferred to have his work-up performed here in [place].  They have requested that we perform pulmonary function testing and and a thoracic aortic angiogram with visualization of the arch vessels to help with surgical planning.  Unfortunately today because of slow transit/low cardiac output contrast was given but could not be properly timed based on a Hounsfield unit count of 130 and imaging was therefore  not performed.  This study will need to be repeated. 2. Continue current therapy which includes furosemide 80 mg/day carvedilol 25 mg twice daily amiodarone 200 mg/day to maintain rhythm and rate control we are unable to use ARB/ACE/Entresto  because of CKD. 3. Blood pressure is well controlled. 4. Kidney function will suffer because of contrast administered today.  We will perform a basic metabolic panel in 72 hours.  He is encouraged to orally hydrate but to call if he develops lower extremity swelling or dyspnea. 5. Continue Eliquis 5 mg twice daily.  Eliquis will need to be held for 48 hours prior to coming right left heart cath 6. On amiodarone and could be in atrial flutter or fibrillation today as I do not see distinct P waves. 7. Not assessed 8. Not addressed  Overall plan is to move forward with left to right heart catheterization in the next 7 to 10 days based upon how he handles the contrast load that was given for evaluation of the aortic root and arch vessels.  Basic metabolic panel in 72 hours.  He was counseled to undergo left and right heart catheterization:The patient was counseled to undergo left and right heart catheterization, with coronary angiography.. The procedural risks and benefits were discussed in detail. The risks discussed included death, stroke, myocardial infarction, life-threatening bleeding, limb ischemia, kidney injury (higher than usual risk due to underlying CKD stage IV), allergy, and possible emergency cardiac surgery. The risk of these significant complications were estimated to occur less than 1% of the time. After discussion, the patient has agreed to proceed.   Medication Adjustments/Labs and Tests Ordered: Current medicines are reviewed at length with the patient today.  Concerns regarding medicines are outlined above.  Orders Placed This Encounter  Procedures  . Basic metabolic panel  . CBC  . Basic metabolic panel  . EKG 12-Lead   No orders of the  defined types were placed in this encounter.   Patient Instructions  Medication Instructions:  Your physician recommends that you continue on your current medications as directed. Please refer to the Current Medication list given to you today.  If you need a refill on your cardiac medications before your next appointment, please call your pharmacy.   Lab work: Your physician recommends that you return for lab work in: this Monday (BMET)  Your physician recommends that you return for lab work on the following Monday (10/28) BMET and CBC.   If you have labs (blood work) drawn today and your tests are completely normal, you will receive your results only by: Marland Kitchen MyChart Message (if you have MyChart) OR . A paper copy in the mail If you have any lab test that is abnormal or we need to change your treatment, we will call you to review the results.  Testing/Procedures:  Your physician has recommended that you have a pulmonary function test. Pulmonary Function Tests are a group of tests that measure how well air moves in and out of your lungs.   Your physician has requested that you have a cardiac catheterization. Cardiac catheterization is used to diagnose and/or treat various heart conditions. Doctors may recommend this procedure for a number of different reasons. The most common reason is to evaluate chest pain. Chest pain can be a symptom of coronary artery disease (CAD), and cardiac catheterization can show whether plaque is narrowing or blocking your heart's arteries. This procedure is also used to evaluate the valves, as well as measure the blood flow and oxygen levels in different parts of your heart. For further information please visit [email or web address]. Please follow instruction sheet, as given.   Follow-Up:  Follow up will be based on heart catheterization and timing for your valve surgery.  Any Other  Special Instructions Will Be Listed Below (If Applicable).      [place] OFFICE [place], [place] [address] Dept: [phone] Loc: [phone]  Eric Corning  MD  07/12/2018  You are scheduled for a Cardiac Catheterization on Friday, November 1 with Dr. Daneen Schick.  1. Please arrive at the [facility] (Main Entrance A) at [facility]: [address] [place], [place] [address] at 7:00 AM (This time is two hours before your procedure to ensure your preparation). Free valet parking service is available.   Special note: Every effort is made to have your procedure done on time. Please understand that emergencies sometimes delay scheduled procedures.  2. Diet: Do not eat solid foods after midnight.  The patient may have clear liquids until 5am upon the day of the procedure.  3. Labs: You will need to have a BMP on Monday, 07/15/18 and then BMP and CBC on 07/22/18.  4. Medication instructions in preparation for your procedure:   Contrast Allergy: No  Stop taking Eliquis (Apixiban) on Wednesday, October 30.  Hold your Furosemide the morning of the procedure.   On the morning of your procedure, take an Aspirin and any morning medicines NOT listed above.  You may use sips of water.  5. Plan for one night stay--bring personal belongings. 6. Bring a current list of your medications and current insurance cards. 7. You MUST have a responsible person to drive you home. 8. Someone MUST be with you the first 24 hours after you arrive home or your discharge will be delayed. 9. Please wear clothes that are easy to get on and off and wear slip-on shoes.  Thank you for allowing [place] to care for you!   -- [facility]       Signed, Sinclair Grooms, MD  07/12/2018 5:04 PM    [place]

## 2018-07-11 NOTE — Progress Notes (Signed)
Cardiology Office Note:    Date:  07/12/2018   ID:  Eric Corning  MD, DOB August 18, 1951, MRN [id number]  PCP:  Glendale Chard, MD  Cardiologist:  No primary care provider on file.   Referring MD: Glendale Chard, MD   Chief Complaint  Patient presents with  . Atrial Fibrillation  . 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 Now with recent acute on chronic systolic HF and progression of mild to moderate AR to severe by echo 03/2018.  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  He has been taking diuretic therapy as recommended.  No lower extremity swelling, orthopnea, PND has been noted.  He came to the office today to have an aortic CT angiogram performed as part of his work-up/preparation for aortic valve replacement and root replacement.  He denies chest pain.  He received 80 cc of contrast today but because of his severe aortic regurgitation the trigger in the descending aorta never reached the Hounsfield units required to initiate scanning.  Therefore he received contrast without any imaging being performed and because of his kidney function that shows against re-administering contrast.  The study will need to be repeated once we determine how his kidneys handle the contrast load administered today.  The protocol on the next occasion will be 100 cc of contrast with triggering in the arch of the aorta.  Past Medical History:  Diagnosis Date  . CHF (congestive heart failure) ([place])   . Chronic kidney disease    kidney fx studies increased   . Chronic lower back pain   . Claustrophobia   . Dysrhythmia    "palpitations"  . Exertional dyspnea 01/2012  . Heart murmur   . Hypertension   .  Hypothyroidism   . Migraine 02/13/12   "opthalmic"  . Varicose vein of leg    right    Past Surgical History:  Procedure Laterality Date  . CARDIAC CATHETERIZATION N/A 02/17/2016   Procedure: Left Heart Cath and Coronary Angiography;  Surgeon: Jettie Booze, MD;  Location: [place] [facility];  Service: Cardiovascular;  Laterality: N/A;  . CARDIOVERSION  03/22/2012   Procedure: CARDIOVERSION;  Surgeon: Candee Furbish, MD;  Location: [facility] ENDOSCOPY;  Service: Cardiovascular;  Laterality: N/A;  . CARDIOVERSION  04/19/2012   Procedure: CARDIOVERSION;  Surgeon: Sinclair Grooms, MD;  Location: [place];  Service: Cardiovascular;  Laterality: N/A;  . COLONOSCOPY N/A 04/11/2013   Procedure: COLONOSCOPY;  Surgeon: Inda Castle, MD;  Location: [facility] ENDOSCOPY;  Service: Endoscopy;  Laterality: N/A;  . COLONOSCOPY N/A 04/11/2013   Procedure: COLONOSCOPY;  Surgeon: Inda Castle, MD;  Location: [facility] ENDOSCOPY;  Service: Endoscopy;  Laterality: N/A;  . EP IMPLANTABLE DEVICE N/A 02/17/2016   Procedure: BiV ICD Insertion CRT-D;  Surgeon: Evans Lance, MD;  Location: [place] [facility];  Service: Cardiovascular;  Laterality: N/A;  . FINGER SURGERY  2012   "4th digit right hand; thumb on left hand"  . RADIOLOGY WITH ANESTHESIA N/A 02/11/2016   Procedure: MRI OF THE BRAIN WITHOUT CONTRAST, LUMBAR WITHOUT CONTRAST;  Surgeon: Medication Radiologist, MD;  Location: [place];  Service: Radiology;  Laterality: N/A;  DR. WOOD/MRI  . Skin melanocytoma excision  2012   "  above left clavicle"  . TEE WITHOUT CARDIOVERSION  03/22/2012   Procedure: TRANSESOPHAGEAL ECHOCARDIOGRAM (TEE);  Surgeon: Candee Furbish, MD;  Location: [facility] ENDOSCOPY;  Service: Cardiovascular;  Laterality: N/A;  . TEE WITHOUT CARDIOVERSION N/A 02/08/2016   Procedure: TRANSESOPHAGEAL ECHOCARDIOGRAM (TEE);  Surgeon: Lelon Perla, MD;  Location: [facility] ENDOSCOPY;  Service: Cardiovascular;  Laterality: N/A;    Current Medications: Current Meds  Medication  Sig  . acetaminophen (TYLENOL) 500 MG tablet Take 500 mg by mouth every 6 (six) hours as needed.  Marland Kitchen amiodarone (PACERONE) 200 MG tablet Take 200 mg (one tablet) by mouth daily Monday through Saturday.  Do not take on Sunday.  Marland Kitchen apixaban (ELIQUIS) 5 MG TABS tablet Take 1 tablet (5 mg total) by mouth 2 (two) times daily.  . carvedilol (COREG) 25 MG tablet Take 1 tablet (25 mg total) by mouth 2 (two) times daily.  . cholecalciferol (VITAMIN D) 1000 units tablet Take 1,000 Units by mouth daily.  . diazepam (VALIUM) 5 MG tablet Take 1 tablet (5mg  total) 1 hour prior to CT  . docusate sodium (COLACE) 100 MG capsule Take 100 mg by mouth daily as needed for mild constipation.  . fluticasone (FLONASE) 50 MCG/ACT nasal spray Place 2 sprays into the nose as needed for allergies.   . furosemide (LASIX) 80 MG tablet Take 1 tablet (80 mg total) by mouth daily.  Marland Kitchen gabapentin (NEURONTIN) 100 MG capsule Take 200 mg by mouth at bedtime.  . isosorbide-hydrALAZINE (BIDIL) 20-37.5 MG tablet Take 0.5 tablets by mouth 3 (three) times daily.  Marland Kitchen levothyroxine (SYNTHROID, LEVOTHROID) 75 MCG tablet TAKE 1 TABLET BY MOUTH DAILY BEFORE BREAKFAST  . oxymetazoline (AFRIN) 0.05 % nasal spray Place 1 spray into both nostrils 2 (two) times daily as needed for congestion.  . rosuvastatin (CRESTOR) 20 MG tablet Take 1 tablet (20 mg total) by mouth daily.  . vitamin B-12 (CYANOCOBALAMIN) 100 MCG tablet Take 100 mcg by mouth daily as needed (energy).   . vitamin C (ASCORBIC ACID) 500 MG tablet Take 500 mg by mouth daily.     Allergies:   Lactose and Lactose intolerance (gi)   Social History   Socioeconomic History  . Marital status: Married    Spouse name: [name]  . Number of children: 1  . Years of education: Not on file  . Highest education level: Not on file  Occupational History    Employer: [employer]  Social Needs  . Financial resource strain: Not on file  . Food insecurity:    Worry: Not on file     Inability: Not on file  . Transportation needs:    Medical: Not on file    Non-medical: Not on file  Tobacco Use  . Smoking status: Never Smoker  . Smokeless tobacco: Never Used  Substance and Sexual Activity  . Alcohol use: Yes    Comment: 02/13/12 "socially"  . Drug use: No  . Sexual activity: Not Currently  Lifestyle  . Physical activity:    Days per week: Not on file    Minutes per session: Not on file  . Stress: Not on file  Relationships  . Social connections:    Talks on phone: Not on file    Gets together: Not on file    Attends religious service: Not on file    Active member of club or organization: Not on file    Attends meetings of clubs or organizations: Not on file    Relationship status: Not  on file  Other Topics Concern  . 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     Family History: The patient's family history includes Diabetes in his mother; Heart disease in his father and mother; Heart failure in his father and mother; Hypertension in his father and mother.  ROS:   Please see the history of present illness.    Occasional abdominal discomfort and occasional chills.  Otherwise no complaints.  All other systems reviewed and are negative.  EKGs/Labs/Other Studies Reviewed:    The following studies were reviewed today: Data.  EKG:  EKG is  ordered today.  The ekg ordered today demonstrates ventricular paced rhythm with apparent tracking although difficult to tell and there easily could be and probably is underlying atrial fibrillation.  Recent Labs: 04/25/2018: Hemoglobin 11.7; Platelets 268 07/09/2018: BUN 25; Creatinine, Ser 1.88; Potassium 3.7; Sodium 140  Recent Lipid Panel    Component Value Date/Time   CHOL 163 12/27/2016 0848   TRIG 72 12/27/2016 0848   HDL 64 12/27/2016 0848   CHOLHDL 2.5 12/27/2016 0848   CHOLHDL 3.8 02/13/2012 1829   VLDL 30 02/13/2012 1829    LDLCALC 85 12/27/2016 0848    Physical Exam:    VS:  BP 138/80   Pulse 83   Ht 5\' 9"  (1.753 m)   Wt 248 lb (112.5 kg)   BMI 36.62 kg/m     Wt Readings from Last 3 Encounters:  07/12/18 248 lb (112.5 kg)  04/25/18 236 lb 12.8 oz (107.4 kg)  10/31/17 242 lb 12.8 oz (110.1 kg)     GEN: Obese, mostly truncal.  Well nourished, well developed in no acute distress HEENT: Normal NECK: Distention with the patient lying at 45 degrees.Marland Kitchen LYMPHATICS: No lymphadenopathy CARDIAC: RRR, 601 holodiastolic decrescendo aortic regurgitation murmur, no gallop, no peripheral edema. VASCULAR: 2+ bilateral radial pulses.  No bruits. RESPIRATORY:  Clear to auscultation without rales, wheezing or rhonchi  ABDOMEN: Soft, non-tender, non-distended, No pulsatile mass, MUSCULOSKELETAL: No deformity  SKIN: Warm and dry NEUROLOGIC:  Alert and oriented x 3 PSYCHIATRIC:  Normal affect   ASSESSMENT:    1. Severe aortic regurgitation   2. Chronic systolic heart failure ([place])   3. Essential hypertension   4. CKD (chronic kidney disease), stage IV ([place])   5. Long term (current) use of anticoagulants   6. Atypical atrial flutter ([place])   7. Diabetes mellitus type 2 in nonobese ([facility])   8. Left bundle branch block    PLAN:    In order of problems listed above:  1. Severe aortic regurgitation and known aortic root enlargement in need of surgical repair.  He is consented to undergo left and right heart catheterization in preparation for referral back to [facility] for surgical therapy.  He preferred to have his work-up performed here in [place].  They have requested that we perform pulmonary function testing and and a thoracic aortic angiogram with visualization of the arch vessels to help with surgical planning.  Unfortunately today because of slow transit/low cardiac output contrast was given but could not be properly timed based on a Hounsfield unit count of 130 and imaging was therefore  not performed.  This study will need to be repeated. 2. Continue current therapy which includes furosemide 80 mg/day carvedilol 25 mg twice daily amiodarone 200 mg/day to maintain rhythm and rate control we are unable to use ARB/ACE/Entresto  because of CKD. 3. Blood pressure is well controlled. 4. Kidney function will suffer because of contrast administered today.  We will perform a basic metabolic panel in 72 hours.  He is encouraged to orally hydrate but to call if he develops lower extremity swelling or dyspnea. 5. Continue Eliquis 5 mg twice daily.  Eliquis will need to be held for 48 hours prior to coming right left heart cath 6. On amiodarone and could be in atrial flutter or fibrillation today as I do not see distinct P waves. 7. Not assessed 8. Not addressed  Overall plan is to move forward with left to right heart catheterization in the next 7 to 10 days based upon how he handles the contrast load that was given for evaluation of the aortic root and arch vessels.  Basic metabolic panel in 72 hours.  He was counseled to undergo left and right heart catheterization:The patient was counseled to undergo left and right heart catheterization, with coronary angiography.. The procedural risks and benefits were discussed in detail. The risks discussed included death, stroke, myocardial infarction, life-threatening bleeding, limb ischemia, kidney injury (higher than usual risk due to underlying CKD stage IV), allergy, and possible emergency cardiac surgery. The risk of these significant complications were estimated to occur less than 1% of the time. After discussion, the patient has agreed to proceed.   Medication Adjustments/Labs and Tests Ordered: Current medicines are reviewed at length with the patient today.  Concerns regarding medicines are outlined above.  Orders Placed This Encounter  Procedures  . Basic metabolic panel  . CBC  . Basic metabolic panel  . EKG 12-Lead   No orders of the  defined types were placed in this encounter.   Patient Instructions  Medication Instructions:  Your physician recommends that you continue on your current medications as directed. Please refer to the Current Medication list given to you today.  If you need a refill on your cardiac medications before your next appointment, please call your pharmacy.   Lab work: Your physician recommends that you return for lab work in: this Monday (BMET)  Your physician recommends that you return for lab work on the following Monday (10/28) BMET and CBC.   If you have labs (blood work) drawn today and your tests are completely normal, you will receive your results only by: Marland Kitchen MyChart Message (if you have MyChart) OR . A paper copy in the mail If you have any lab test that is abnormal or we need to change your treatment, we will call you to review the results.  Testing/Procedures:  Your physician has recommended that you have a pulmonary function test. Pulmonary Function Tests are a group of tests that measure how well air moves in and out of your lungs.   Your physician has requested that you have a cardiac catheterization. Cardiac catheterization is used to diagnose and/or treat various heart conditions. Doctors may recommend this procedure for a number of different reasons. The most common reason is to evaluate chest pain. Chest pain can be a symptom of coronary artery disease (CAD), and cardiac catheterization can show whether plaque is narrowing or blocking your heart's arteries. This procedure is also used to evaluate the valves, as well as measure the blood flow and oxygen levels in different parts of your heart. For further information please visit HugeFiesta.tn. Please follow instruction sheet, as given.   Follow-Up:  Follow up will be based on heart catheterization and timing for your valve surgery.  Any Other  Special Instructions Will Be Listed Below (If Applicable).      [facility] [place], [place] [address] Dept: [phone] Loc: [phone]  Eric Corning  MD  07/12/2018  You are scheduled for a Cardiac Catheterization on Friday, November 1 with Dr. Daneen Schick.  1. Please arrive at the [facility] (Main Entrance A) at [facility]: [address] [place], [place] [address] at 7:00 AM (This time is two hours before your procedure to ensure your preparation). Free valet parking service is available.   Special note: Every effort is made to have your procedure done on time. Please understand that emergencies sometimes delay scheduled procedures.  2. Diet: Do not eat solid foods after midnight.  The patient may have clear liquids until 5am upon the day of the procedure.  3. Labs: You will need to have a BMP on Monday, 07/15/18 and then BMP and CBC on 07/22/18.  4. Medication instructions in preparation for your procedure:   Contrast Allergy: No  Stop taking Eliquis (Apixiban) on Wednesday, October 30.  Hold your Furosemide the morning of the procedure.   On the morning of your procedure, take an Aspirin and any morning medicines NOT listed above.  You may use sips of water.  5. Plan for one night stay--bring personal belongings. 6. Bring a current list of your medications and current insurance cards. 7. You MUST have a responsible person to drive you home. 8. Someone MUST be with you the first 24 hours after you arrive home or your discharge will be delayed. 9. Please wear clothes that are easy to get on and off and wear slip-on shoes.  Thank you for allowing [place] to care for you!   -- [place] [facility]       Signed, Sinclair Grooms, MD  07/12/2018 5:04 PM    [place]

## 2018-07-12 ENCOUNTER — Ambulatory Visit (INDEPENDENT_AMBULATORY_CARE_PROVIDER_SITE_OTHER): Payer: Medicare Other | Admitting: Interventional Cardiology

## 2018-07-12 ENCOUNTER — Ambulatory Visit
Admission: RE | Admit: 2018-07-12 | Discharge: 2018-07-12 | Disposition: A | Discharge status: Home / Self Care | Payer: Medicare Other | Source: Ambulatory Visit | Attending: Interventional Cardiology | Admitting: Interventional Cardiology

## 2018-07-12 ENCOUNTER — Encounter: Payer: Self-pay | Admitting: Interventional Cardiology

## 2018-07-12 VITALS — BP 138/80 | HR 83 | Ht 69.0 in | Wt 248.0 lb

## 2018-07-12 DIAGNOSIS — I447 Left bundle-branch block, unspecified: Secondary | ICD-10-CM | POA: Diagnosis not present

## 2018-07-12 DIAGNOSIS — I5022 Chronic systolic (congestive) heart failure: Secondary | ICD-10-CM

## 2018-07-12 DIAGNOSIS — E119 Type 2 diabetes mellitus without complications: Secondary | ICD-10-CM

## 2018-07-12 DIAGNOSIS — I484 Atypical atrial flutter: Secondary | ICD-10-CM

## 2018-07-12 DIAGNOSIS — I712 Thoracic aortic aneurysm, without rupture, unspecified: Secondary | ICD-10-CM

## 2018-07-12 DIAGNOSIS — N184 Chronic kidney disease, stage 4 (severe): Secondary | ICD-10-CM | POA: Diagnosis not present

## 2018-07-12 DIAGNOSIS — I10 Essential (primary) hypertension: Secondary | ICD-10-CM | POA: Diagnosis not present

## 2018-07-12 DIAGNOSIS — I351 Nonrheumatic aortic (valve) insufficiency: Secondary | ICD-10-CM | POA: Diagnosis not present

## 2018-07-12 DIAGNOSIS — Z7901 Long term (current) use of anticoagulants: Secondary | ICD-10-CM | POA: Diagnosis not present

## 2018-07-12 MED ORDER — IOPAMIDOL (ISOVUE-370) INJECTION 76%: 80.0000 mL | Freq: Once | INTRAVENOUS | Status: DC | PRN

## 2018-07-12 NOTE — Patient Instructions (Signed)
Medication Instructions:  Your physician recommends that you continue on your current medications as directed. Please refer to the Current Medication list given to you today.  If you need a refill on your cardiac medications before your next appointment, please call your pharmacy.   Lab work: Your physician recommends that you return for lab work in: this Monday (BMET)  Your physician recommends that you return for lab work on the following Monday (10/28) BMET and CBC.   If you have labs (blood work) drawn today and your tests are completely normal, you will receive your results only by: Marland Kitchen MyChart Message (if you have MyChart) OR . A paper copy in the mail If you have any lab test that is abnormal or we need to change your treatment, we will call you to review the results.  Testing/Procedures:  Your physician has recommended that you have a pulmonary function test. Pulmonary Function Tests are a group of tests that measure how well air moves in and out of your lungs.   Your physician has requested that you have a cardiac catheterization. Cardiac catheterization is used to diagnose and/or treat various heart conditions. Doctors may recommend this procedure for a number of different reasons. The most common reason is to evaluate chest pain. Chest pain can be a symptom of coronary artery disease (CAD), and cardiac catheterization can show whether plaque is narrowing or blocking your heart's arteries. This procedure is also used to evaluate the valves, as well as measure the blood flow and oxygen levels in different parts of your heart. For further information please visit HugeFiesta.tn. Please follow instruction sheet, as given.   Follow-Up:  Follow up will be based on heart catheterization and timing for your valve surgery.  Any Other Special Instructions Will Be Listed Below (If Applicable).      [facility] [place], [place] [address] Dept: [phone] Loc: [phone]  [name]  MD  07/12/2018  You are scheduled for a Cardiac Catheterization on Friday, November 1 with Dr. Daneen Schick.  1. Please arrive at the [facility] (Main Entrance A) at [facility]: [address] [place], [place] [address] at 7:00 AM (This time is two hours before your procedure to ensure your preparation). Free valet parking service is available.   Special note: Every effort is made to have your procedure done on time. Please understand that emergencies sometimes delay scheduled procedures.  2. Diet: Do not eat solid foods after midnight.  The patient may have clear liquids until 5am upon the day of the procedure.  3. Labs: You will need to have a BMP on Monday, 07/15/18 and then BMP and CBC on 07/22/18.  4. Medication instructions in preparation for your procedure:   Contrast Allergy: No  Stop taking Eliquis (Apixiban) on Wednesday, October 30.  Hold your Furosemide the morning of the procedure.   On the morning of your procedure, take an Aspirin and any morning medicines NOT listed above.  You may use sips of water.  5. Plan for one night stay--bring personal belongings. 6. Bring a current list of your medications and current insurance cards. 7. You MUST have a responsible person to drive you home. 8. Someone MUST be with you the first 24 hours after you arrive home or your discharge will be delayed. 9. Please wear clothes that are easy to get on and off and wear slip-on shoes.  Thank you for allowing [place] to care for you!   -- [place] [facility]

## 2018-07-15 ENCOUNTER — Other Ambulatory Visit: Payer: Medicare Other | Admitting: *Deleted

## 2018-07-15 DIAGNOSIS — N184 Chronic kidney disease, stage 4 (severe): Secondary | ICD-10-CM

## 2018-07-16 LAB — BASIC METABOLIC PANEL
BUN/Creatinine Ratio: 11 (ref 10–24)
BUN: 23 mg/dL (ref 8–27)
CO2: 21 mmol/L (ref 20–29)
Calcium: 8.6 mg/dL (ref 8.6–10.2)
Chloride: 105 mmol/L (ref 96–106)
Creatinine, Ser: 2.08 mg/dL — ABNORMAL HIGH (ref 0.76–1.27)
GFR calc Af Amer: 37 mL/min/{1.73_m2} — ABNORMAL LOW (ref >59)
GFR, EST NON AFRICAN AMERICAN: 32 mL/min/{1.73_m2} — AB (ref >59)
GLUCOSE: 80 mg/dL (ref 65–99)
POTASSIUM: 3.7 mmol/L (ref 3.5–5.2)
Sodium: 137 mmol/L (ref 134–144)

## 2018-07-19 ENCOUNTER — Ambulatory Visit (INDEPENDENT_AMBULATORY_CARE_PROVIDER_SITE_OTHER): Payer: Medicare Other | Admitting: Internal Medicine

## 2018-07-19 DIAGNOSIS — I5022 Chronic systolic (congestive) heart failure: Secondary | ICD-10-CM

## 2018-07-19 DIAGNOSIS — I351 Nonrheumatic aortic (valve) insufficiency: Secondary | ICD-10-CM

## 2018-07-19 DIAGNOSIS — R0602 Shortness of breath: Secondary | ICD-10-CM

## 2018-07-19 LAB — PULMONARY FUNCTION TEST
DL/VA % pred: 76 %
DL/VA: 3.41 ml/min/mmHg/L
DLCO UNC % PRED: 53 %
DLCO UNC: 15.54 ml/min/mmHg
FEF 25-75 Post: 2.25 L/sec
FEF 25-75 Pre: 1.63 L/sec
FEF2575-%CHANGE-POST: 38 %
FEF2575-%PRED-POST: 92 %
FEF2575-%PRED-PRE: 67 %
FEV1-%Change-Post: 7 %
FEV1-%Pred-Post: 81 %
FEV1-%Pred-Pre: 75 %
FEV1-POST: 2.2 L
FEV1-Pre: 2.04 L
FEV1FVC-%CHANGE-POST: 4 %
FEV1FVC-%PRED-PRE: 99 %
FEV6-%CHANGE-POST: 3 %
FEV6-%PRED-PRE: 78 %
FEV6-%Pred-Post: 80 %
FEV6-Post: 2.73 L
FEV6-Pre: 2.64 L
FEV6FVC-%CHANGE-POST: 0 %
FEV6FVC-%Pred-Post: 104 %
FEV6FVC-%Pred-Pre: 104 %
FVC-%Change-Post: 3 %
FVC-%PRED-POST: 77 %
FVC-%Pred-Pre: 74 %
FVC-PRE: 2.65 L
FVC-Post: 2.73 L
POST FEV1/FVC RATIO: 80 %
Post FEV6/FVC ratio: 100 %
Pre FEV1/FVC ratio: 77 %
Pre FEV6/FVC Ratio: 100 %
RV % PRED: 89 %
RV: 1.99 L
TLC % pred: 72 %
TLC: 4.74 L

## 2018-07-19 NOTE — Progress Notes (Signed)
PFT done today. 

## 2018-07-22 ENCOUNTER — Other Ambulatory Visit: Payer: Medicare Other | Admitting: *Deleted

## 2018-07-22 DIAGNOSIS — I5022 Chronic systolic (congestive) heart failure: Secondary | ICD-10-CM | POA: Diagnosis not present

## 2018-07-22 DIAGNOSIS — Z7901 Long term (current) use of anticoagulants: Secondary | ICD-10-CM | POA: Diagnosis not present

## 2018-07-22 DIAGNOSIS — N184 Chronic kidney disease, stage 4 (severe): Secondary | ICD-10-CM | POA: Diagnosis not present

## 2018-07-23 ENCOUNTER — Telehealth: Payer: Self-pay | Admitting: *Deleted

## 2018-07-23 LAB — BASIC METABOLIC PANEL
BUN/Creatinine Ratio: 14 (ref 10–24)
BUN: 26 mg/dL (ref 8–27)
CALCIUM: 8.7 mg/dL (ref 8.6–10.2)
CHLORIDE: 106 mmol/L (ref 96–106)
CO2: 20 mmol/L (ref 20–29)
Creatinine, Ser: 1.89 mg/dL — ABNORMAL HIGH (ref 0.76–1.27)
GFR calc Af Amer: 42 mL/min/{1.73_m2} — ABNORMAL LOW (ref >59)
GFR calc non Af Amer: 36 mL/min/{1.73_m2} — ABNORMAL LOW (ref >59)
GLUCOSE: 142 mg/dL — AB (ref 65–99)
POTASSIUM: 3.6 mmol/L (ref 3.5–5.2)
Sodium: 139 mmol/L (ref 134–144)

## 2018-07-23 LAB — CBC
HEMATOCRIT: 36.2 % — AB (ref 37.5–51.0)
Hemoglobin: 11.9 g/dL — ABNORMAL LOW (ref 13.0–17.7)
MCH: 30 pg (ref 26.6–33.0)
MCHC: 32.9 g/dL (ref 31.5–35.7)
MCV: 91 fL (ref 79–97)
Platelets: 263 10*3/uL (ref 150–450)
RBC: 3.97 x10E6/uL — AB (ref 4.14–5.80)
RDW: 15.1 % (ref 12.3–15.4)
WBC: 7.3 10*3/uL (ref 3.4–10.8)

## 2018-07-23 NOTE — Telephone Encounter (Signed)
Pt contacted pre-catheterization scheduled at [facility] for: Friday July 26, 2018 12 noon Verified arrival time and place: [place] Entrance A at: 7 AM-pre-procedure hydration Per Dr Jaymes Graff hydration rate 100 ml/hr.  No solid food after midnight prior to cath, clear liquids until 5 AM day of procedure. Contrast allergy: no Verified no diabetes medications.  Hold: Eliquis-none 07/24/18 until post procedure. Furosemide- day of procedure. Pt will reduce furosemide dose from 80 mg to 40 mg day before procedure.  Except hold medications AM meds can be  taken pre-cath with sip of water including: ASA 81 mg  Confirmed patient has responsible person to drive home post procedure and for 24 hours after you arrive home: yes

## 2018-07-26 ENCOUNTER — Encounter: Payer: Self-pay | Admitting: Internal Medicine

## 2018-07-26 ENCOUNTER — Other Ambulatory Visit: Payer: Self-pay | Admitting: *Deleted

## 2018-07-26 ENCOUNTER — Encounter (HOSPITAL_COMMUNITY)
Admission: RE | Disposition: A | Discharge status: Home / Self Care | Payer: Self-pay | Source: Ambulatory Visit | Attending: Interventional Cardiology

## 2018-07-26 ENCOUNTER — Ambulatory Visit (HOSPITAL_COMMUNITY)
Admission: RE | Admit: 2018-07-26 | Discharge: 2018-07-26 | Disposition: A | Discharge status: Home / Self Care | Payer: Medicare Other | Source: Ambulatory Visit | Attending: Interventional Cardiology | Admitting: Interventional Cardiology

## 2018-07-26 ENCOUNTER — Ambulatory Visit: Payer: Medicare Other | Admitting: Internal Medicine

## 2018-07-26 DIAGNOSIS — Z95810 Presence of automatic (implantable) cardiac defibrillator: Secondary | ICD-10-CM | POA: Diagnosis not present

## 2018-07-26 DIAGNOSIS — I130 Hypertensive heart and chronic kidney disease with heart failure and stage 1 through stage 4 chronic kidney disease, or unspecified chronic kidney disease: Secondary | ICD-10-CM | POA: Insufficient documentation

## 2018-07-26 DIAGNOSIS — I484 Atypical atrial flutter: Secondary | ICD-10-CM | POA: Insufficient documentation

## 2018-07-26 DIAGNOSIS — I2510 Atherosclerotic heart disease of native coronary artery without angina pectoris: Secondary | ICD-10-CM | POA: Diagnosis not present

## 2018-07-26 DIAGNOSIS — Z8249 Family history of ischemic heart disease and other diseases of the circulatory system: Secondary | ICD-10-CM | POA: Insufficient documentation

## 2018-07-26 DIAGNOSIS — E039 Hypothyroidism, unspecified: Secondary | ICD-10-CM | POA: Insufficient documentation

## 2018-07-26 DIAGNOSIS — I351 Nonrheumatic aortic (valve) insufficiency: Secondary | ICD-10-CM | POA: Diagnosis present

## 2018-07-26 DIAGNOSIS — Z7951 Long term (current) use of inhaled steroids: Secondary | ICD-10-CM | POA: Insufficient documentation

## 2018-07-26 DIAGNOSIS — Z8674 Personal history of sudden cardiac arrest: Secondary | ICD-10-CM | POA: Insufficient documentation

## 2018-07-26 DIAGNOSIS — I272 Pulmonary hypertension, unspecified: Secondary | ICD-10-CM | POA: Diagnosis not present

## 2018-07-26 DIAGNOSIS — G8929 Other chronic pain: Secondary | ICD-10-CM | POA: Insufficient documentation

## 2018-07-26 DIAGNOSIS — I5022 Chronic systolic (congestive) heart failure: Secondary | ICD-10-CM

## 2018-07-26 DIAGNOSIS — I4892 Unspecified atrial flutter: Secondary | ICD-10-CM | POA: Diagnosis present

## 2018-07-26 DIAGNOSIS — M545 Low back pain: Secondary | ICD-10-CM | POA: Diagnosis not present

## 2018-07-26 DIAGNOSIS — Z952 Presence of prosthetic heart valve: Secondary | ICD-10-CM | POA: Diagnosis not present

## 2018-07-26 DIAGNOSIS — G43909 Migraine, unspecified, not intractable, without status migrainosus: Secondary | ICD-10-CM | POA: Diagnosis not present

## 2018-07-26 DIAGNOSIS — I5042 Chronic combined systolic (congestive) and diastolic (congestive) heart failure: Secondary | ICD-10-CM | POA: Diagnosis present

## 2018-07-26 DIAGNOSIS — I480 Paroxysmal atrial fibrillation: Secondary | ICD-10-CM | POA: Diagnosis not present

## 2018-07-26 DIAGNOSIS — I447 Left bundle-branch block, unspecified: Secondary | ICD-10-CM | POA: Diagnosis not present

## 2018-07-26 DIAGNOSIS — N184 Chronic kidney disease, stage 4 (severe): Secondary | ICD-10-CM | POA: Diagnosis not present

## 2018-07-26 DIAGNOSIS — I428 Other cardiomyopathies: Secondary | ICD-10-CM | POA: Diagnosis not present

## 2018-07-26 DIAGNOSIS — I5023 Acute on chronic systolic (congestive) heart failure: Secondary | ICD-10-CM | POA: Insufficient documentation

## 2018-07-26 DIAGNOSIS — E1122 Type 2 diabetes mellitus with diabetic chronic kidney disease: Secondary | ICD-10-CM | POA: Insufficient documentation

## 2018-07-26 DIAGNOSIS — Z7901 Long term (current) use of anticoagulants: Secondary | ICD-10-CM | POA: Diagnosis not present

## 2018-07-26 DIAGNOSIS — I10 Essential (primary) hypertension: Secondary | ICD-10-CM | POA: Diagnosis present

## 2018-07-26 HISTORY — PX: RIGHT/LEFT HEART CATH AND CORONARY ANGIOGRAPHY: CATH118266

## 2018-07-26 LAB — POCT I-STAT 3, VENOUS BLOOD GAS (G3P V)
Acid-base deficit: 2 mmol/L (ref 0.0–2.0)
Acid-base deficit: 2 mmol/L (ref 0.0–2.0)
BICARBONATE: 23.9 mmol/L (ref 20.0–28.0)
Bicarbonate: 24.3 mmol/L (ref 20.0–28.0)
O2 Saturation: 62 %
O2 Saturation: 62 %
PCO2 VEN: 47.2 mmHg (ref 44.0–60.0)
PCO2 VEN: 47.3 mmHg (ref 44.0–60.0)
PH VEN: 7.313 (ref 7.250–7.430)
PO2 VEN: 35 mmHg (ref 32.0–45.0)
TCO2: 25 mmol/L (ref 22–32)
TCO2: 26 mmol/L (ref 22–32)
pH, Ven: 7.319 (ref 7.250–7.430)
pO2, Ven: 35 mmHg (ref 32.0–45.0)

## 2018-07-26 LAB — POCT I-STAT 3, ART BLOOD GAS (G3+)
Acid-base deficit: 3 mmol/L — ABNORMAL HIGH (ref 0.0–2.0)
Bicarbonate: 22.6 mmol/L (ref 20.0–28.0)
O2 Saturation: 94 %
PCO2 ART: 42.8 mmHg (ref 32.0–48.0)
PH ART: 7.331 — AB (ref 7.350–7.450)
TCO2: 24 mmol/L (ref 22–32)
pO2, Arterial: 75 mmHg — ABNORMAL LOW (ref 83.0–108.0)

## 2018-07-26 SURGERY — RIGHT/LEFT HEART CATH AND CORONARY ANGIOGRAPHY
Anesthesia: LOCAL

## 2018-07-26 MED ORDER — IOHEXOL 350 MG/ML SOLN
INTRAVENOUS | Status: DC | PRN
  Administered 2018-07-26: 70 mL via INTRA_ARTERIAL

## 2018-07-26 MED ORDER — FUROSEMIDE 80 MG PO TABS: 80.0000 mg | ORAL_TABLET | Freq: Every day | ORAL | 3 refills | Status: DC

## 2018-07-26 MED ORDER — SODIUM CHLORIDE 0.9 % IV SOLN: INTRAVENOUS | Status: DC

## 2018-07-26 MED ORDER — VERAPAMIL HCL 2.5 MG/ML IV SOLN
INTRAVENOUS | Status: DC | PRN
  Administered 2018-07-26: 10 mL via INTRA_ARTERIAL

## 2018-07-26 MED ORDER — VERAPAMIL HCL 2.5 MG/ML IV SOLN
INTRAVENOUS | Status: AC
  Filled 2018-07-26: qty 2

## 2018-07-26 MED ORDER — FENTANYL CITRATE (PF) 100 MCG/2ML IJ SOLN
INTRAMUSCULAR | Status: AC
  Filled 2018-07-26: qty 2

## 2018-07-26 MED ORDER — HEPARIN (PORCINE) IN NACL 1000-0.9 UT/500ML-% IV SOLN
INTRAVENOUS | Status: DC | PRN
  Administered 2018-07-26 (×2): 500 mL

## 2018-07-26 MED ORDER — ASPIRIN 81 MG PO CHEW: 81.0000 mg | CHEWABLE_TABLET | ORAL | Status: DC

## 2018-07-26 MED ORDER — SODIUM CHLORIDE 0.9 % IV SOLN: 250.0000 mL | INTRAVENOUS | Status: DC | PRN

## 2018-07-26 MED ORDER — ACETAMINOPHEN 325 MG PO TABS: 650.0000 mg | ORAL_TABLET | ORAL | Status: DC | PRN

## 2018-07-26 MED ORDER — ONDANSETRON HCL 4 MG/2ML IJ SOLN: 4.0000 mg | Freq: Four times a day (QID) | INTRAMUSCULAR | Status: DC | PRN

## 2018-07-26 MED ORDER — SODIUM CHLORIDE 0.9% FLUSH: 3.0000 mL | Freq: Two times a day (BID) | INTRAVENOUS | Status: DC

## 2018-07-26 MED ORDER — MIDAZOLAM HCL 2 MG/2ML IJ SOLN
INTRAMUSCULAR | Status: DC | PRN
  Administered 2018-07-26: 1 mg via INTRAVENOUS
  Administered 2018-07-26: 0.5 mg via INTRAVENOUS

## 2018-07-26 MED ORDER — HEPARIN SODIUM (PORCINE) 1000 UNIT/ML IJ SOLN
INTRAMUSCULAR | Status: DC | PRN
  Administered 2018-07-26: 4000 [IU] via INTRAVENOUS

## 2018-07-26 MED ORDER — FENTANYL CITRATE (PF) 100 MCG/2ML IJ SOLN
INTRAMUSCULAR | Status: DC | PRN
  Administered 2018-07-26 (×2): 25 ug via INTRAVENOUS

## 2018-07-26 MED ORDER — MIDAZOLAM HCL 2 MG/2ML IJ SOLN
INTRAMUSCULAR | Status: AC
  Filled 2018-07-26: qty 2

## 2018-07-26 MED ORDER — LIDOCAINE HCL (PF) 1 % IJ SOLN
INTRAMUSCULAR | Status: DC | PRN
  Administered 2018-07-26: 2 mL

## 2018-07-26 MED ORDER — OXYCODONE HCL 5 MG PO TABS: 5.0000 mg | ORAL_TABLET | ORAL | Status: DC | PRN

## 2018-07-26 MED ORDER — SODIUM CHLORIDE 0.9 % IV SOLN
INTRAVENOUS | Status: DC
  Administered 2018-07-26: 08:00:00 via INTRAVENOUS

## 2018-07-26 MED ORDER — SODIUM CHLORIDE 0.9% FLUSH: 3.0000 mL | INTRAVENOUS | Status: DC | PRN

## 2018-07-26 MED ORDER — HEPARIN (PORCINE) IN NACL 1000-0.9 UT/500ML-% IV SOLN
INTRAVENOUS | Status: AC
  Filled 2018-07-26: qty 1000

## 2018-07-26 MED ORDER — LIDOCAINE HCL (PF) 1 % IJ SOLN
INTRAMUSCULAR | Status: AC
  Filled 2018-07-26: qty 30

## 2018-07-26 SURGICAL SUPPLY — 15 items
CATH BALLN WEDGE 5F 110CM (CATHETERS) ×2 IMPLANT
CATH INFINITI 5 FR JL3.5 (CATHETERS) ×2 IMPLANT
CATH INFINITI 5FR JL4 (CATHETERS) ×2 IMPLANT
CATH LAUNCHER 5F RADR (CATHETERS) ×1 IMPLANT
CATHETER LAUNCHER 5F RADR (CATHETERS) ×2
DEVICE RAD COMP TR BAND LRG (VASCULAR PRODUCTS) ×2 IMPLANT
GLIDESHEATH SLEND A-KIT 6F 22G (SHEATH) ×2 IMPLANT
GUIDEWIRE INQWIRE 1.5J.035X260 (WIRE) ×1 IMPLANT
INQWIRE 1.5J .035X260CM (WIRE) ×2
KIT HEART LEFT (KITS) ×2 IMPLANT
PACK CARDIAC CATHETERIZATION (CUSTOM PROCEDURE TRAY) ×2 IMPLANT
SHEATH GLIDE SLENDER 4/5FR (SHEATH) ×2 IMPLANT
SHEATH PROBE COVER 6X72 (BAG) ×2 IMPLANT
TRANSDUCER W/STOPCOCK (MISCELLANEOUS) ×2 IMPLANT
TUBING CIL FLEX 10 FLL-RA (TUBING) ×2 IMPLANT

## 2018-07-26 NOTE — Discharge Instructions (Signed)

## 2018-07-26 NOTE — Interval H&P Note (Signed)
Cath Lab Visit (complete for each Cath Lab visit)  Clinical Evaluation Leading to the Procedure:   ACS: No.  Non-ACS:    Anginal Classification: CCS III  Anti-ischemic medical therapy: Minimal Therapy (1 class of medications)  Non-Invasive Test Results: No non-invasive testing performed  Prior CABG: No previous CABG      History and Physical Interval Note:  07/26/2018 9:21 AM  [name]., MD  has presented today for surgery, with the diagnosis of aortic regurgetation  The various methods of treatment have been discussed with the patient and family. After consideration of risks, benefits and other options for treatment, the patient has consented to  Procedure(s): RIGHT/LEFT HEART CATH AND CORONARY ANGIOGRAPHY (N/A) as a surgical intervention .  The patient's history has been reviewed, patient examined, no change in status, stable for surgery.  I have reviewed the patient's chart and labs.  Questions were answered to the patient's satisfaction.     Belva Crome III

## 2018-07-29 ENCOUNTER — Encounter (HOSPITAL_COMMUNITY): Payer: Self-pay | Admitting: Interventional Cardiology

## 2018-07-29 ENCOUNTER — Other Ambulatory Visit: Payer: Self-pay | Admitting: Internal Medicine

## 2018-07-29 ENCOUNTER — Other Ambulatory Visit: Payer: Medicare Other | Admitting: *Deleted

## 2018-07-29 DIAGNOSIS — I5022 Chronic systolic (congestive) heart failure: Secondary | ICD-10-CM | POA: Diagnosis not present

## 2018-07-30 LAB — BASIC METABOLIC PANEL
BUN/Creatinine Ratio: 11 (ref 10–24)
BUN: 26 mg/dL (ref 8–27)
CALCIUM: 8.6 mg/dL (ref 8.6–10.2)
CO2: 23 mmol/L (ref 20–29)
Chloride: 104 mmol/L (ref 96–106)
Creatinine, Ser: 2.31 mg/dL — ABNORMAL HIGH (ref 0.76–1.27)
GFR calc Af Amer: 33 mL/min/{1.73_m2} — ABNORMAL LOW (ref >59)
GFR, EST NON AFRICAN AMERICAN: 28 mL/min/{1.73_m2} — AB (ref >59)
GLUCOSE: 114 mg/dL — AB (ref 65–99)
Potassium: 3.5 mmol/L (ref 3.5–5.2)
Sodium: 139 mmol/L (ref 134–144)

## 2018-08-01 ENCOUNTER — Telehealth: Payer: Self-pay | Admitting: *Deleted

## 2018-08-01 DIAGNOSIS — N184 Chronic kidney disease, stage 4 (severe): Secondary | ICD-10-CM

## 2018-08-01 DIAGNOSIS — I5022 Chronic systolic (congestive) heart failure: Secondary | ICD-10-CM

## 2018-08-01 NOTE — Telephone Encounter (Signed)
Left message to call back  Order placed for BMET

## 2018-08-01 NOTE — Telephone Encounter (Signed)
-----   Message from Belva Crome, MD sent at 07/30/2018 10:38 PM EST ----- Let the patient know creat is up. Hold Lasix 1 day; Orally hydrate. Repeat 1 week. A copy will be sent to Glendale Chard, MD

## 2018-08-02 NOTE — Telephone Encounter (Signed)
Spoke with pt and went over results and recommendations.  Pt will plan to have labs repeated when he is here for CT.  Pt verbalized understanding and was in agreement with this plan.

## 2018-08-06 ENCOUNTER — Ambulatory Visit: Payer: Medicare Other | Admitting: Podiatry

## 2018-08-07 LAB — CUP PACEART REMOTE DEVICE CHECK
Brady Statistic AP VS Percent: 1 %
Brady Statistic AS VP Percent: 91 %
Brady Statistic RA Percent Paced: 6.4 %
HighPow Impedance: 69 Ohm
HighPow Impedance: 69 Ohm
Implantable Lead Implant Date: 20170525
Implantable Lead Location: 753858
Implantable Lead Location: 753860
Implantable Lead Model: 7122
Implantable Pulse Generator Implant Date: 20170525
Lead Channel Impedance Value: 450 Ohm
Lead Channel Impedance Value: 600 Ohm
Lead Channel Pacing Threshold Amplitude: 0.5 V
Lead Channel Pacing Threshold Amplitude: 0.75 V
Lead Channel Pacing Threshold Amplitude: 1.25 V
Lead Channel Pacing Threshold Pulse Width: 0.5 ms
Lead Channel Sensing Intrinsic Amplitude: 0.5 mV
Lead Channel Setting Pacing Amplitude: 2 V
Lead Channel Setting Pacing Amplitude: 2.25 V
Lead Channel Setting Pacing Pulse Width: 0.5 ms
Lead Channel Setting Pacing Pulse Width: 0.5 ms
Lead Channel Setting Sensing Sensitivity: 0.5 mV
MDC IDC LEAD IMPLANT DT: 20170525
MDC IDC LEAD IMPLANT DT: 20170525
MDC IDC LEAD LOCATION: 753859
MDC IDC MSMT BATTERY REMAINING LONGEVITY: 61 mo
MDC IDC MSMT BATTERY REMAINING PERCENTAGE: 70 %
MDC IDC MSMT BATTERY VOLTAGE: 2.96 V
MDC IDC MSMT LEADCHNL LV PACING THRESHOLD PULSEWIDTH: 0.5 ms
MDC IDC MSMT LEADCHNL RV IMPEDANCE VALUE: 430 Ohm
MDC IDC MSMT LEADCHNL RV PACING THRESHOLD PULSEWIDTH: 0.5 ms
MDC IDC MSMT LEADCHNL RV SENSING INTR AMPL: 11.4 mV
MDC IDC SESS DTM: 20191015030704
MDC IDC SET LEADCHNL RA PACING AMPLITUDE: 2 V
MDC IDC STAT BRADY AP VP PERCENT: 6.9 %
MDC IDC STAT BRADY AS VS PERCENT: 2.1 %
Pulse Gen Serial Number: 7357926

## 2018-08-08 ENCOUNTER — Telehealth: Payer: Self-pay | Admitting: Interventional Cardiology

## 2018-08-08 ENCOUNTER — Telehealth: Payer: Self-pay | Admitting: *Deleted

## 2018-08-08 ENCOUNTER — Other Ambulatory Visit: Payer: Medicare Other

## 2018-08-08 DIAGNOSIS — N184 Chronic kidney disease, stage 4 (severe): Secondary | ICD-10-CM

## 2018-08-08 DIAGNOSIS — I5022 Chronic systolic (congestive) heart failure: Secondary | ICD-10-CM | POA: Diagnosis not present

## 2018-08-08 MED ORDER — DIAZEPAM 5 MG PO TABS: ORAL_TABLET | ORAL | 0 refills | Status: DC

## 2018-08-08 NOTE — Telephone Encounter (Signed)
Did not need this encounter °

## 2018-08-08 NOTE — Telephone Encounter (Signed)
Okay to give the patient Valium 5 mg for CT scan tomorrow

## 2018-08-08 NOTE — Telephone Encounter (Signed)
Pt is having CT done tomorrow and previously had to take a Valium .Will forward to Dr Tamala Julian to see if wil order once again ./cy

## 2018-08-09 ENCOUNTER — Other Ambulatory Visit: Payer: Medicare Other

## 2018-08-09 ENCOUNTER — Ambulatory Visit
Admission: RE | Admit: 2018-08-09 | Discharge: 2018-08-09 | Disposition: A | Discharge status: Home / Self Care | Payer: Medicare Other | Source: Ambulatory Visit | Attending: Interventional Cardiology | Admitting: Interventional Cardiology

## 2018-08-09 ENCOUNTER — Inpatient Hospital Stay: Admission: RE | Admit: 2018-08-09 | Payer: Medicare Other | Source: Ambulatory Visit

## 2018-08-09 ENCOUNTER — Ambulatory Visit (INDEPENDENT_AMBULATORY_CARE_PROVIDER_SITE_OTHER)
Admission: RE | Admit: 2018-08-09 | Discharge: 2018-08-09 | Disposition: A | Discharge status: Home / Self Care | Payer: Medicare Other | Source: Ambulatory Visit | Attending: Interventional Cardiology | Admitting: Interventional Cardiology

## 2018-08-09 ENCOUNTER — Telehealth: Payer: Self-pay | Admitting: *Deleted

## 2018-08-09 DIAGNOSIS — I712 Thoracic aortic aneurysm, without rupture, unspecified: Secondary | ICD-10-CM

## 2018-08-09 LAB — BASIC METABOLIC PANEL
BUN / CREAT RATIO: 13 (ref 10–24)
BUN: 26 mg/dL (ref 8–27)
CO2: 22 mmol/L (ref 20–29)
Calcium: 9.1 mg/dL (ref 8.6–10.2)
Chloride: 101 mmol/L (ref 96–106)
Creatinine, Ser: 1.99 mg/dL — ABNORMAL HIGH (ref 0.76–1.27)
GFR calc Af Amer: 39 mL/min/{1.73_m2} — ABNORMAL LOW (ref >59)
GFR calc non Af Amer: 34 mL/min/{1.73_m2} — ABNORMAL LOW (ref >59)
Glucose: 93 mg/dL (ref 65–99)
POTASSIUM: 3.9 mmol/L (ref 3.5–5.2)
SODIUM: 139 mmol/L (ref 134–144)

## 2018-08-09 MED ORDER — IOPAMIDOL (ISOVUE-370) INJECTION 76%
100.0000 mL | Freq: Once | INTRAVENOUS | Status: AC | PRN
  Administered 2018-08-09: 100 mL via INTRAVENOUS

## 2018-08-09 NOTE — Telephone Encounter (Signed)
Script given yesterday for Valium 5 mg signed by Dr Johnsie Cancel .Adonis Housekeeper

## 2018-08-09 NOTE — Telephone Encounter (Signed)
[name] from CT calling to request a new order to be placed in on this pt, for the order in there is not linking to the image. Pt has already had his scan done today, and the current order placed for the image is unable to be linked to the image.  Stacy CT Tech requesting for a new order for CT Angio Chest Aorta to be placed in on this pt, for thoracic aortic aneurysm.  Order placed under ordering Cardiologist, Dr Tamala Julian.  Stacy appreciative for all the assistance provided.

## 2018-08-12 ENCOUNTER — Other Ambulatory Visit: Payer: Self-pay | Admitting: *Deleted

## 2018-08-12 DIAGNOSIS — N184 Chronic kidney disease, stage 4 (severe): Secondary | ICD-10-CM

## 2018-08-12 DIAGNOSIS — I5023 Acute on chronic systolic (congestive) heart failure: Secondary | ICD-10-CM

## 2018-08-12 NOTE — Progress Notes (Signed)
bmet  

## 2018-08-15 ENCOUNTER — Other Ambulatory Visit: Payer: Medicare Other | Admitting: *Deleted

## 2018-08-15 ENCOUNTER — Ambulatory Visit (INDEPENDENT_AMBULATORY_CARE_PROVIDER_SITE_OTHER): Payer: Medicare Other | Admitting: Internal Medicine

## 2018-08-15 ENCOUNTER — Other Ambulatory Visit: Payer: Medicare Other

## 2018-08-15 ENCOUNTER — Encounter

## 2018-08-15 ENCOUNTER — Encounter: Payer: Self-pay | Admitting: Internal Medicine

## 2018-08-15 VITALS — BP 144/78 | HR 70 | Ht 69.0 in | Wt 240.8 lb

## 2018-08-15 DIAGNOSIS — I5022 Chronic systolic (congestive) heart failure: Secondary | ICD-10-CM | POA: Diagnosis not present

## 2018-08-15 DIAGNOSIS — I4901 Ventricular fibrillation: Secondary | ICD-10-CM

## 2018-08-15 DIAGNOSIS — I480 Paroxysmal atrial fibrillation: Secondary | ICD-10-CM | POA: Diagnosis not present

## 2018-08-15 DIAGNOSIS — N184 Chronic kidney disease, stage 4 (severe): Secondary | ICD-10-CM | POA: Diagnosis not present

## 2018-08-15 DIAGNOSIS — I5023 Acute on chronic systolic (congestive) heart failure: Secondary | ICD-10-CM

## 2018-08-15 DIAGNOSIS — Z95810 Presence of automatic (implantable) cardiac defibrillator: Secondary | ICD-10-CM | POA: Diagnosis not present

## 2018-08-15 LAB — CUP PACEART INCLINIC DEVICE CHECK
Battery Remaining Longevity: 62 mo
Brady Statistic RA Percent Paced: 6.2 %
Date Time Interrogation Session: 20191121175905
HIGH POWER IMPEDANCE MEASURED VALUE: 67.5 Ohm
Implantable Lead Implant Date: 20170525
Implantable Lead Implant Date: 20170525
Implantable Lead Location: 753859
Lead Channel Impedance Value: 450 Ohm
Lead Channel Pacing Threshold Amplitude: 0.5 V
Lead Channel Pacing Threshold Pulse Width: 0.5 ms
Lead Channel Sensing Intrinsic Amplitude: 0.7 mV
Lead Channel Sensing Intrinsic Amplitude: 11.7 mV
Lead Channel Setting Pacing Amplitude: 2 V
Lead Channel Setting Pacing Pulse Width: 0.5 ms
Lead Channel Setting Pacing Pulse Width: 0.5 ms
Lead Channel Setting Sensing Sensitivity: 0.5 mV
MDC IDC LEAD IMPLANT DT: 20170525
MDC IDC LEAD LOCATION: 753858
MDC IDC LEAD LOCATION: 753860
MDC IDC MSMT LEADCHNL LV IMPEDANCE VALUE: 650 Ohm
MDC IDC MSMT LEADCHNL LV PACING THRESHOLD AMPLITUDE: 1.25 V
MDC IDC MSMT LEADCHNL LV PACING THRESHOLD PULSEWIDTH: 0.5 ms
MDC IDC MSMT LEADCHNL RV IMPEDANCE VALUE: 425 Ohm
MDC IDC PG IMPLANT DT: 20170525
MDC IDC SET LEADCHNL LV PACING AMPLITUDE: 2.25 V
MDC IDC SET LEADCHNL RV PACING AMPLITUDE: 2 V
Pulse Gen Serial Number: 7357926

## 2018-08-15 NOTE — Patient Instructions (Addendum)
Medication Instructions:  Your physician recommends that you continue on your current medications as directed. Please refer to the Current Medication list given to you today.  Labwork: None ordered.  Testing/Procedures: None ordered.  Follow-Up: Your physician wants you to follow-up in: 3 months with Dr. Lovena Le.     Remote monitoring is used to monitor your ICD from home. This monitoring reduces the number of office visits required to check your device to one time per year. It allows [place] to keep an eye on the functioning of your device to ensure it is working properly. You are scheduled for a device check from home on 10/07/2018. You may send your transmission at any time that day. If you have a wireless device, the transmission will be sent automatically. After your physician reviews your transmission, you will receive a postcard with your next transmission date.  Any Other Special Instructions Will Be Listed Below (If Applicable).  If you need a refill on your cardiac medications before your next appointment, please call your pharmacy.

## 2018-08-15 NOTE — Progress Notes (Signed)
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 He has a h/o paroxysmal atrial fib and atypical flutter. He has been on amio for all of the above. He was noted this summer to have increasing dyspnea for which he underwent an echo which suggested worsening AI with LV dilation. He has seen Dr. Evelina Dun at [facility] and is considering surgery. He does not have palpitations. He has been on fairly low dose of amiodarone.  Allergies  Allergen Reactions  . Lactose Intolerance (Gi) Diarrhea     Current Outpatient Medications  Medication Sig Dispense Refill  . acetaminophen (TYLENOL) 500 MG tablet Take 500 mg by mouth every 6 (six) hours as needed for moderate pain or headache.     Marland Kitchen amiodarone (PACERONE) 200 MG tablet Take 200 mg (one tablet) by mouth daily Monday through Saturday.  Do not take on Sunday. (Patient taking differently: Take 200 mg by mouth See admin instructions. Take 200 mg (one tablet) by mouth daily Monday through Saturday.  Do not take on Sunday.) 90 tablet 3  . apixaban (ELIQUIS) 5 MG TABS tablet Take 1 tablet (5 mg total) by mouth 2 (two) times daily. 60 tablet 11  . B Complex Vitamins (B COMPLEX 100 PO) Take 1 tablet by mouth 3 (three) times a week.    Marland Kitchen BIDIL 20-37.5 MG tablet TAKE 1 TABLET BY MOUTH THREE TIMES DAILY 90 tablet 0  . carvedilol (COREG) 25 MG tablet Take 1 tablet (25 mg total) by mouth 2 (two) times daily. 180 tablet 3  . Cholecalciferol (VITAMIN D) 2000 units tablet Take 2,000 Units by mouth 3 (three) times a week.    . diazepam (VALIUM) 5 MG tablet Take 1 tablet (5mg  total) 1 hour prior to CT 1 tablet 0  . diclofenac sodium (VOLTAREN) 1 % GEL Apply 1 application topically 2 (two) times daily as needed (knee pain).    Marland Kitchen docusate sodium (COLACE) 100 MG capsule Take 100 mg by mouth daily as needed for mild constipation.    . fluticasone (FLONASE) 50 MCG/ACT nasal spray Place 2 sprays into the nose as needed for allergies.     . furosemide  (LASIX) 80 MG tablet Take 1 tablet (80 mg total) by mouth daily. 90 tablet 3  . Hypromellose (ARTIFICIAL TEARS OP) Place 1 drop into both eyes daily as needed (dry eyes).    Marland Kitchen levothyroxine (SYNTHROID, LEVOTHROID) 75 MCG tablet TAKE 1 TABLET BY MOUTH DAILY BEFORE BREAKFAST (Patient taking differently: Take 75 mcg by mouth See admin instructions. Take 75 mcg daily in the morning except on Sundays take 37.5 mcg in the morning) 90 tablet 0  . oxymetazoline (AFRIN) 0.05 % nasal spray Place 1 spray into both nostrils 2 (two) times daily as needed for congestion.    . rosuvastatin (CRESTOR) 20 MG tablet Take 1 tablet (20 mg total) by mouth daily. (Patient taking differently: Take 20 mg by mouth every evening. ) 90 tablet 2  . vitamin B-12 (CYANOCOBALAMIN) 100 MCG tablet Take 100 mcg by mouth 3 (three) times a week.     Marland Kitchen ZINC-VITAMIN C PO Take 1 tablet by mouth every other day.     No current facility-administered medications for this visit.      Past Medical History:  Diagnosis Date  . CHF (congestive heart failure) ([place])   . Chronic kidney disease    kidney fx studies increased   . Chronic lower back pain   .  Claustrophobia   . Dysrhythmia    "palpitations"  . Exertional dyspnea 01/2012  . Heart murmur   . Hypertension   . Hypothyroidism   . Migraine 02/13/12   "opthalmic"  . Varicose vein of leg    right    ROS:   All systems reviewed and negative except as noted in the HPI.   Past Surgical History:  Procedure Laterality Date  . CARDIAC CATHETERIZATION N/A 02/17/2016   Procedure: Left Heart Cath and Coronary Angiography;  Surgeon: Jettie Booze, MD;  Location: [place] [facility];  Service: Cardiovascular;  Laterality: N/A;  . CARDIOVERSION  03/22/2012   Procedure: CARDIOVERSION;  Surgeon: Candee Furbish, MD;  Location: [facility] ENDOSCOPY;  Service: Cardiovascular;  Laterality: N/A;  . CARDIOVERSION  04/19/2012   Procedure: CARDIOVERSION;  Surgeon: Sinclair Grooms, MD;  Location: [place];  Service: Cardiovascular;  Laterality: N/A;  . COLONOSCOPY N/A 04/11/2013   Procedure: COLONOSCOPY;  Surgeon: Inda Castle, MD;  Location: [facility] ENDOSCOPY;  Service: Endoscopy;  Laterality: N/A;  . COLONOSCOPY N/A 04/11/2013   Procedure: COLONOSCOPY;  Surgeon: Inda Castle, MD;  Location: [facility] ENDOSCOPY;  Service: Endoscopy;  Laterality: N/A;  . EP IMPLANTABLE DEVICE N/A 02/17/2016   Procedure: BiV ICD Insertion CRT-D;  Surgeon: Evans Lance, MD;  Location: [place] [facility];  Service: Cardiovascular;  Laterality: N/A;  . FINGER SURGERY  2012   "4th digit right hand; thumb on left hand"  . RADIOLOGY WITH ANESTHESIA N/A 02/11/2016   Procedure: MRI OF THE BRAIN WITHOUT CONTRAST, LUMBAR WITHOUT CONTRAST;  Surgeon: Medication Radiologist, MD;  Location: [place];  Service: Radiology;  Laterality: N/A;  DR. WOOD/MRI  . RIGHT/LEFT HEART CATH AND CORONARY ANGIOGRAPHY N/A 07/26/2018   Procedure: RIGHT/LEFT HEART CATH AND CORONARY ANGIOGRAPHY;  Surgeon: Belva Crome, MD;  Location: [place] [facility];  Service: Cardiovascular;  Laterality: N/A;  . Skin melanocytoma excision  2012   "above left clavicle"  . TEE WITHOUT CARDIOVERSION  03/22/2012   Procedure: TRANSESOPHAGEAL ECHOCARDIOGRAM (TEE);  Surgeon: Candee Furbish, MD;  Location: [facility] ENDOSCOPY;  Service: Cardiovascular;  Laterality: N/A;  . TEE WITHOUT CARDIOVERSION N/A 02/08/2016   Procedure: TRANSESOPHAGEAL ECHOCARDIOGRAM (TEE);  Surgeon: Lelon Perla, MD;  Location: [facility] ENDOSCOPY;  Service: Cardiovascular;  Laterality: N/A;     Family History  Problem Relation Age of Onset  . Hypertension Mother   . Heart disease Mother   . Heart failure Mother   . Diabetes Mother   . Hypertension Father   . Heart disease Father   . Heart failure Father      Social History   Socioeconomic History  . Marital status: Married    Spouse name: [name]  . Number of children: 1  . Years of education: Not on file  . Highest education level: Not on file    Occupational History    Employer: [employer]  Social Needs  . Financial resource strain: Not on file  . Food insecurity:    Worry: Not on file    Inability: Not on file  . Transportation needs:    Medical: Not on file    Non-medical: Not on file  Tobacco Use  . Smoking status: Never Smoker  . Smokeless tobacco: Never Used  Substance and Sexual Activity  . Alcohol use: Yes    Comment: 02/13/12 "socially"  . Drug use: No  . Sexual activity: Not Currently  Lifestyle  . Physical activity:  Days per week: Not on file    Minutes per session: Not on file  . Stress: Not on file  Relationships  . Social connections:    Talks on phone: Not on file    Gets together: Not on file    Attends religious service: Not on file    Active member of club or organization: Not on file    Attends meetings of clubs or organizations: Not on file    Relationship status: Not on file  . Intimate partner violence:    Fear of current or ex partner: Not on file    Emotionally abused: Not on file    Physically abused: Not on file    Forced sexual activity: Not on file  Other Topics Concern  . 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     BP (!) 144/78   Pulse 70   Ht 5\' 9"  (1.753 m)   Wt 240 lb 12.8 oz (109.2 kg)   SpO2 97%   BMI 35.56 kg/m   Physical Exam:  Well appearing NAD HEENT: Unremarkable Neck:  No JVD, no thyromegally Lymphatics:  No adenopathy Back:  No CVA tenderness Lungs:  Clear HEART:  Regular rate rhythm, no murmurs, no rubs, no clicks Abd:  soft, positive bowel sounds, no organomegally, no rebound, no guarding Ext:  2 plus pulses, no edema, no cyanosis, no clubbing Skin:  No rashes no nodules Neuro:  CN II through XII intact, motor grossly intact  EKG - atrial fib/flutter with biv pacing  DEVICE  Normal device function.  See PaceArt for details.   Assess/Plan: 1.  Persistent atrial fib/flutter - we discussed proceeding with DCCV today. I reviewed with Dr. Tamala Julian. As he may end up undergoing valve surgery, I have recommended we hold off on DCCV unless surgery is either delayed or thought to be too high risk. I mentioned that MAZE would be a nice concomittant procedure at the time of AVR if advisable from a surgical perspective.  2. 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 ICD - his [employer] device is working normally.  4. Chronic systolic heart failure - his symptoms are class 2B. He will continue his current meds.  Mikle Bosworth.D.

## 2018-08-16 LAB — BASIC METABOLIC PANEL
BUN / CREAT RATIO: 14 (ref 10–24)
BUN: 27 mg/dL (ref 8–27)
CO2: 23 mmol/L (ref 20–29)
Calcium: 9 mg/dL (ref 8.6–10.2)
Chloride: 104 mmol/L (ref 96–106)
Creatinine, Ser: 1.94 mg/dL — ABNORMAL HIGH (ref 0.76–1.27)
GFR calc Af Amer: 41 mL/min/{1.73_m2} — ABNORMAL LOW (ref >59)
GFR calc non Af Amer: 35 mL/min/{1.73_m2} — ABNORMAL LOW (ref >59)
GLUCOSE: 104 mg/dL — AB (ref 65–99)
POTASSIUM: 4.1 mmol/L (ref 3.5–5.2)
SODIUM: 140 mmol/L (ref 134–144)

## 2018-09-02 ENCOUNTER — Telehealth: Payer: Self-pay | Admitting: Internal Medicine

## 2018-09-02 NOTE — Telephone Encounter (Signed)
Patient states that he can feel his HR a lot more since AMS was reprogrammed to [place]. Patient states that he has not noticed any improvements with the change, and would like to be reprogrammed back to previous settings. Appt scheduled for 12/10 @ 1600. Patient verbalized understanding.

## 2018-09-02 NOTE — Telephone Encounter (Signed)
° ° ° °  Patient calling to report palpitations since adjustment made to device, patient specifically requesting to speak with device nurse

## 2018-09-03 ENCOUNTER — Ambulatory Visit (INDEPENDENT_AMBULATORY_CARE_PROVIDER_SITE_OTHER): Payer: Medicare Other | Admitting: *Deleted

## 2018-09-03 DIAGNOSIS — I5022 Chronic systolic (congestive) heart failure: Secondary | ICD-10-CM

## 2018-09-03 LAB — CUP PACEART INCLINIC DEVICE CHECK
Battery Remaining Longevity: 60 mo
Brady Statistic RV Percent Paced: 97 %
Date Time Interrogation Session: 20191210163934
HighPow Impedance: 69.75 Ohm
Implantable Lead Implant Date: 20170525
Implantable Lead Implant Date: 20170525
Implantable Lead Implant Date: 20170525
Implantable Lead Location: 753858
Implantable Lead Location: 753859
Implantable Lead Model: 7122
Implantable Pulse Generator Implant Date: 20170525
Lead Channel Impedance Value: 412.5 Ohm
Lead Channel Impedance Value: 437.5 Ohm
Lead Channel Impedance Value: 650 Ohm
Lead Channel Pacing Threshold Amplitude: 0.5 V
Lead Channel Pacing Threshold Pulse Width: 0.5 ms
Lead Channel Pacing Threshold Pulse Width: 0.5 ms
Lead Channel Sensing Intrinsic Amplitude: 11.4 mV
Lead Channel Setting Pacing Amplitude: 2 V
Lead Channel Setting Pacing Amplitude: 2 V
Lead Channel Setting Pacing Amplitude: 2.25 V
Lead Channel Setting Pacing Pulse Width: 0.5 ms
Lead Channel Setting Pacing Pulse Width: 0.5 ms
Lead Channel Setting Sensing Sensitivity: 0.5 mV
MDC IDC LEAD LOCATION: 753860
MDC IDC MSMT LEADCHNL LV PACING THRESHOLD AMPLITUDE: 1.25 V
MDC IDC MSMT LEADCHNL RA SENSING INTR AMPL: 0.5 mV
MDC IDC STAT BRADY RA PERCENT PACED: 0 %
Pulse Gen Serial Number: 7357926

## 2018-09-03 NOTE — Progress Notes (Signed)
CRT-D device check in office per patient request. Patient states that he can feel his heart beating faster since reprogramming on 11/21. Patient states that he felt fine before, and wants the rate response turned off. Auto mode switch reprogrammed to DDI. Patient to f/u as scheduled.

## 2018-09-30 ENCOUNTER — Telehealth: Payer: Self-pay | Admitting: Cardiology

## 2018-09-30 NOTE — Telephone Encounter (Signed)
Spoke with patient. Advised that transmission received this afternoon is WNL for patient, known persistent AF/A-flutter. No VT/VF episodes. Patient verbalizes understanding and appreciation of call.
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
## 2018-10-09 ENCOUNTER — Encounter: Payer: Self-pay | Admitting: Internal Medicine

## 2018-10-09 ENCOUNTER — Other Ambulatory Visit: Payer: Self-pay

## 2018-10-09 ENCOUNTER — Ambulatory Visit (INDEPENDENT_AMBULATORY_CARE_PROVIDER_SITE_OTHER): Payer: Medicare Other | Admitting: Internal Medicine

## 2018-10-09 ENCOUNTER — Telehealth: Payer: Self-pay

## 2018-10-09 VITALS — BP 124/70 | HR 70 | Temp 97.3°F | Ht 69.0 in | Wt 238.8 lb

## 2018-10-09 DIAGNOSIS — I130 Hypertensive heart and chronic kidney disease with heart failure and stage 1 through stage 4 chronic kidney disease, or unspecified chronic kidney disease: Secondary | ICD-10-CM

## 2018-10-09 DIAGNOSIS — N183 Chronic kidney disease, stage 3 unspecified: Secondary | ICD-10-CM

## 2018-10-09 DIAGNOSIS — E039 Hypothyroidism, unspecified: Secondary | ICD-10-CM

## 2018-10-09 DIAGNOSIS — I5022 Chronic systolic (congestive) heart failure: Secondary | ICD-10-CM

## 2018-10-09 DIAGNOSIS — Z79899 Other long term (current) drug therapy: Secondary | ICD-10-CM

## 2018-10-09 DIAGNOSIS — R05 Cough: Secondary | ICD-10-CM

## 2018-10-09 DIAGNOSIS — R059 Cough, unspecified: Secondary | ICD-10-CM

## 2018-10-09 DIAGNOSIS — Z6835 Body mass index (BMI) 35.0-35.9, adult: Secondary | ICD-10-CM | POA: Diagnosis not present

## 2018-10-09 DIAGNOSIS — E1122 Type 2 diabetes mellitus with diabetic chronic kidney disease: Secondary | ICD-10-CM

## 2018-10-09 MED ORDER — LEVOTHYROXINE SODIUM 75 MCG PO TABS: 75.0000 ug | ORAL_TABLET | ORAL | 1 refills | Status: DC

## 2018-10-09 NOTE — Telephone Encounter (Signed)
Left message for patient to remind of missed remote transmission.  

## 2018-10-09 NOTE — Progress Notes (Signed)
Subjective:     Patient ID: [name]., MD , male    DOB: 01-30-1951 , 68 y.o.   MRN: [id number]   Chief Complaint  Patient presents with  . Cough  . Diabetes    HPI  Cough  This is a chronic problem. The current episode started more than 1 year ago. The problem has been gradually worsening. The cough is productive of sputum. Associated symptoms include nasal congestion and postnasal drip. Pertinent negatives include no chest pain, chills, ear congestion, ear pain, headaches, sore throat, shortness of breath or sweats. He has tried rest for the symptoms.  Diabetes  He presents for his follow-up diabetic visit. He has type 2 diabetes mellitus. His disease course has been stable. There are no hypoglycemic associated symptoms. Pertinent negatives for hypoglycemia include no headaches or sweats. Pertinent negatives for diabetes include no blurred vision and no chest pain. There are no hypoglycemic complications. Diabetic complications include nephropathy. Risk factors for coronary artery disease include diabetes mellitus, hypertension, dyslipidemia, male sex and obesity.     Past Medical History:  Diagnosis Date  . CHF (congestive heart failure) ([place])   . Chronic kidney disease    kidney fx studies increased   . Chronic lower back pain   . Claustrophobia   . Dysrhythmia    "palpitations"  . Exertional dyspnea 01/2012  . Heart murmur   . Hypertension   . Hypothyroidism   . Migraine 02/13/12   "opthalmic"  . Varicose vein of leg    right     Family History  Problem Relation Age of Onset  . Hypertension Mother   . Heart disease Mother   . Heart failure Mother   . Diabetes Mother   . Hypertension Father   . Heart disease Father   . Heart failure Father      Current Outpatient Medications:  .  acetaminophen (TYLENOL) 500 MG tablet, Take 500 mg by mouth every 6 (six) hours as needed for moderate pain or headache. , Disp: , Rfl:  .  amiodarone (PACERONE) 200 MG tablet,  Take 200 mg (one tablet) by mouth daily Monday through Saturday.  Do not take on Sunday. (Patient taking differently: Take 200 mg by mouth See admin instructions. Take 200 mg (one tablet) by mouth daily Monday through Saturday.  Do not take on Sunday.), Disp: 90 tablet, Rfl: 3 .  apixaban (ELIQUIS) 5 MG TABS tablet, Take 1 tablet (5 mg total) by mouth 2 (two) times daily., Disp: 60 tablet, Rfl: 11 .  B Complex Vitamins (B COMPLEX 100 PO), Take 1 tablet by mouth 3 (three) times a week., Disp: , Rfl:  .  BIDIL 20-37.5 MG tablet, TAKE 1 TABLET BY MOUTH THREE TIMES DAILY, Disp: 90 tablet, Rfl: 0 .  carvedilol (COREG) 25 MG tablet, Take 1 tablet (25 mg total) by mouth 2 (two) times daily., Disp: 180 tablet, Rfl: 3 .  Cholecalciferol (VITAMIN D) 2000 units tablet, Take 2,000 Units by mouth 3 (three) times a week., Disp: , Rfl:  .  diclofenac sodium (VOLTAREN) 1 % GEL, Apply 1 application topically 2 (two) times daily as needed (knee pain)., Disp: , Rfl:  .  docusate sodium (COLACE) 100 MG capsule, Take 100 mg by mouth daily as needed for mild constipation., Disp: , Rfl:  .  fluticasone (FLONASE) 50 MCG/ACT nasal spray, Place 2 sprays into the nose as needed for allergies. , Disp: , Rfl:  .  furosemide (LASIX) 80 MG tablet, Take 1  tablet (80 mg total) by mouth daily., Disp: 90 tablet, Rfl: 3 .  Hypromellose (ARTIFICIAL TEARS OP), Place 1 drop into both eyes daily as needed (dry eyes)., Disp: , Rfl:  .  ipratropium (ATROVENT) 0.06 % nasal spray, Place 2 sprays into both nostrils 4 (four) times daily., Disp: , Rfl:  .  levothyroxine (SYNTHROID, LEVOTHROID) 75 MCG tablet, Take 1 tablet (75 mcg total) by mouth See admin instructions. Take 75 mcg daily in the morning except on Sundays take 37.5 mcg in the morning, Disp: 90 tablet, Rfl: 1 .  oxymetazoline (AFRIN) 0.05 % nasal spray, Place 1 spray into both nostrils 2 (two) times daily as needed for congestion., Disp: , Rfl:  .  rosuvastatin (CRESTOR) 20 MG tablet,  Take 1 tablet (20 mg total) by mouth daily. (Patient taking differently: Take 20 mg by mouth every evening. ), Disp: 90 tablet, Rfl: 2 .  vitamin B-12 (CYANOCOBALAMIN) 100 MCG tablet, Take 100 mcg by mouth 3 (three) times a week. , Disp: , Rfl:  .  ZINC-VITAMIN C PO, Take 1 tablet by mouth every other day., Disp: , Rfl:    Allergies  Allergen Reactions  . Lactose Intolerance (Gi) Diarrhea     Review of Systems  Constitutional: Negative.  Negative for chills.  HENT: Positive for postnasal drip. Negative for ear pain and sore throat.   Eyes: Negative for blurred vision.  Respiratory: Positive for cough. Negative for shortness of breath.   Cardiovascular: Negative.  Negative for chest pain.  Genitourinary: Negative.   Neurological: Negative.  Negative for headaches.  Psychiatric/Behavioral: Negative.      Today's Vitals   10/09/18 1619  BP: 124/70  Pulse: 70  Temp: (!) 97.3 F (36.3 C)  TempSrc: Oral  SpO2: 98%  Weight: 238 lb 12.8 oz (108.3 kg)  Height: 5\' 9"  (1.753 m)   Body mass index is 35.26 kg/m.   Objective:  Physical Exam Vitals signs and nursing note reviewed.  Constitutional:      Appearance: Normal appearance. He is obese.  HENT:     Head: Normocephalic and atraumatic.     Right Ear: Tympanic membrane, ear canal and external ear normal.     Left Ear: Tympanic membrane, ear canal and external ear normal.     Mouth/Throat:     Mouth: Mucous membranes are moist.     Pharynx: Oropharynx is clear. Posterior oropharyngeal erythema present.  Cardiovascular:     Rate and Rhythm: Normal rate and regular rhythm.     Heart sounds: Murmur present.  Pulmonary:     Effort: Pulmonary effort is normal.     Breath sounds: Normal breath sounds.  Skin:    General: Skin is warm.  Neurological:     General: No focal deficit present.     Mental Status: He is alert.  Psychiatric:        Mood and Affect: Mood normal.         Assessment And Plan:     1. Cough  This  could be multifactorial. He has already been evaluated by ENT. It is my thought that this is postnasal drip. He was given samples of xyzal to take nightly. He is encouraged to resume atrovent NS should his sx not improve. If persistent despite these measures, I would be inclined to consider use of short-term PPI to see if his sx improve. We also discussed several off-label treatments including gabapentin and tramadol. He is encouraged to touch base in a week or  two to let me know how he is doing. At that time, we may need to look further into his diet, or consider food sensitivities.   2. Type 2 diabetes mellitus with stage 3 chronic kidney disease, without long-term current use of insulin ([place])  Recent BMP reviewed. I will check an a1c today.  - Hemoglobin A1c  3. Hypertensive heart and renal disease with renal failure, stage 1 through stage 4 or unspecified chronic kidney disease, with heart failure ([place])  Well controlled. He will continue with current meds. He is encouraged to avoid adding salt to his foods.   4. Chronic systolic heart failure ([facility])  Chronic, yet stable.   5. Primary hypothyroidism  I will check a thyroid panel and adjust meds as needed.  - TSH - T4, Free  6. Drug therapy   7. Class 2 severe obesity due to excess calories with serious comorbidity and body mass index (BMI) of 35.0 to 35.9 in adult [facility])  He is encouraged to strive for BMI less than 30 to decrease cardiac risk. He is encouraged to walk 30 minutes five days weekly.   [name], MD

## 2018-10-10 LAB — T4, FREE: Free T4: 1.61 ng/dL (ref 0.82–1.77)

## 2018-10-10 LAB — HEMOGLOBIN A1C
Est. average glucose Bld gHb Est-mCnc: 137 mg/dL
Hgb A1c MFr Bld: 6.4 % — ABNORMAL HIGH (ref 4.8–5.6)

## 2018-10-10 LAB — TSH: TSH: 3.02 u[IU]/mL (ref 0.450–4.500)

## 2018-10-11 ENCOUNTER — Encounter: Payer: Self-pay | Admitting: Podiatry

## 2018-10-11 ENCOUNTER — Ambulatory Visit (INDEPENDENT_AMBULATORY_CARE_PROVIDER_SITE_OTHER): Payer: Medicare Other | Admitting: Podiatry

## 2018-10-11 ENCOUNTER — Encounter: Payer: Self-pay | Admitting: Cardiology

## 2018-10-11 DIAGNOSIS — B351 Tinea unguium: Secondary | ICD-10-CM | POA: Diagnosis not present

## 2018-10-11 DIAGNOSIS — D689 Coagulation defect, unspecified: Secondary | ICD-10-CM

## 2018-10-11 DIAGNOSIS — E119 Type 2 diabetes mellitus without complications: Secondary | ICD-10-CM

## 2018-10-11 DIAGNOSIS — M79674 Pain in right toe(s): Secondary | ICD-10-CM

## 2018-10-11 DIAGNOSIS — M79675 Pain in left toe(s): Secondary | ICD-10-CM | POA: Diagnosis not present

## 2018-10-11 DIAGNOSIS — L608 Other nail disorders: Secondary | ICD-10-CM

## 2018-10-11 NOTE — Progress Notes (Signed)
Complaint:  Visit Type: Patient returns to my office for continued preventative foot care services. Complaint: Patient states" my nails have grown long and thick and become painful to walk and wear shoes" Patient has been diagnosed with DM with no foot complications. The patient presents for preventative foot care services. No changes to ROS.  Patient is taking eliquiss.  Podiatric Exam: Vascular: dorsalis pedis and posterior tibial pulses are palpable bilateral. Capillary return is immediate. Temperature gradient is WNL. Skin turgor WNL  Sensorium: Normal Semmes Weinstein monofilament test. Normal tactile sensation bilaterally. Nail Exam: Pt has thick disfigured discolored nails with subungual debris noted bilateral entire nail hallux through fifth toenails.  Pincer nails noted. Ulcer Exam: There is no evidence of ulcer or pre-ulcerative changes or infection. Orthopedic Exam: Muscle tone and strength are WNL. No limitations in general ROM. No crepitus or effusions noted. Foot type and digits show no abnormalities. Bony prominences are unremarkable. Skin: No Porokeratosis. No infection or ulcers  Diagnosis:  Onychomycosis, , Pain in right toe, pain in left toes Pincer nails    Treatment & Plan Procedures and Treatment: Consent by patient was obtained for treatment procedures.   Debridement of mycotic and hypertrophic toenails, 1 through 5 bilateral and clearing of subungual debris. No ulceration, no infection noted.  Return Visit-Office Procedure: Patient instructed to return to the office for a follow up visit 3 months for continued evaluation and treatment.    Gardiner Barefoot DPM

## 2018-10-29 ENCOUNTER — Encounter: Payer: Self-pay | Admitting: Internal Medicine

## 2018-10-29 ENCOUNTER — Ambulatory Visit (INDEPENDENT_AMBULATORY_CARE_PROVIDER_SITE_OTHER): Payer: Medicare Other | Admitting: Internal Medicine

## 2018-10-29 VITALS — BP 134/62 | HR 70 | Ht 69.0 in | Wt 240.0 lb

## 2018-10-29 DIAGNOSIS — I480 Paroxysmal atrial fibrillation: Secondary | ICD-10-CM | POA: Diagnosis not present

## 2018-10-29 DIAGNOSIS — Z95810 Presence of automatic (implantable) cardiac defibrillator: Secondary | ICD-10-CM | POA: Diagnosis not present

## 2018-10-29 DIAGNOSIS — I5022 Chronic systolic (congestive) heart failure: Secondary | ICD-10-CM | POA: Diagnosis not present

## 2018-10-29 DIAGNOSIS — I4901 Ventricular fibrillation: Secondary | ICD-10-CM

## 2018-10-29 NOTE — Progress Notes (Signed)
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 He has a h/o paroxysmal atrial fib and atypical flutter. He has been on amio for all of the above. He was noted this summer to have increasing dyspnea for which he underwent an echo which suggested worsening AI with LV dilation. He has seen Dr. Evelina Dun at [facility] and is considering surgery. He does not have palpitations. He has been on fairly low dose of amiodarone. When I saw him last we discussed DC cardioversion but after conferring with his primary cardiologist, we decided to wait until after he had his aortic valve replaced which is scheduled for about 2 weeks from now. Rather than have Dr. Evelina Dun perform the surgery, he is pending surgery with Dr. Jimmye Norman at [facility].  Allergies  Allergen Reactions  . Lactose Intolerance (Gi) Diarrhea     Current Outpatient Medications  Medication Sig Dispense Refill  . acetaminophen (TYLENOL) 500 MG tablet Take 500 mg by mouth every 6 (six) hours as needed for moderate pain or headache.     Marland Kitchen amiodarone (PACERONE) 200 MG tablet Take 200 mg (one tablet) by mouth daily Monday through Saturday.  Do not take on Sunday. (Patient taking differently: Take 200 mg by mouth See admin instructions. Take 200 mg (one tablet) by mouth daily Monday through Saturday.  Do not take on Sunday.) 90 tablet 3  . apixaban (ELIQUIS) 5 MG TABS tablet Take 1 tablet (5 mg total) by mouth 2 (two) times daily. 60 tablet 11  . B Complex Vitamins (B COMPLEX 100 PO) Take 1 tablet by mouth 3 (three) times a week.    Marland Kitchen BIDIL 20-37.5 MG tablet TAKE 1 TABLET BY MOUTH THREE TIMES DAILY 90 tablet 0  . carvedilol (COREG) 25 MG tablet Take 1 tablet (25 mg total) by mouth 2 (two) times daily. 180 tablet 3  . Cholecalciferol (VITAMIN D) 2000 units tablet Take 2,000 Units by mouth 3 (three) times a week.    . docusate sodium (COLACE) 100 MG capsule Take 100 mg by mouth daily as needed for mild constipation.    . fluticasone  (FLONASE) 50 MCG/ACT nasal spray Place 2 sprays into the nose as needed for allergies.     . furosemide (LASIX) 80 MG tablet Take 1 tablet (80 mg total) by mouth daily. 90 tablet 3  . Hypromellose (ARTIFICIAL TEARS OP) Place 1 drop into both eyes daily as needed (dry eyes).    Marland Kitchen ipratropium (ATROVENT) 0.06 % nasal spray Place 2 sprays into both nostrils 4 (four) times daily.    Marland Kitchen levocetirizine (XYZAL) 5 MG tablet Take 5 mg by mouth every evening.    Marland Kitchen levothyroxine (SYNTHROID, LEVOTHROID) 75 MCG tablet Take 1 tablet (75 mcg total) by mouth See admin instructions. Take 75 mcg daily in the morning except on Sundays take 37.5 mcg in the morning 90 tablet 1  . oxymetazoline (AFRIN) 0.05 % nasal spray Place 1 spray into both nostrils 2 (two) times daily as needed for congestion.    . rosuvastatin (CRESTOR) 20 MG tablet Take 1 tablet (20 mg total) by mouth daily. (Patient taking differently: Take 20 mg by mouth every evening. ) 90 tablet 2  . vitamin B-12 (CYANOCOBALAMIN) 100 MCG tablet Take 100 mcg by mouth 3 (three) times a week.     . vitamin C (ASCORBIC ACID) 500 MG tablet Take 500 mg by mouth 3 (three) times a week.    Marland Kitchen ZINC-VITAMIN C  PO Take 1 tablet by mouth every other day.     No current facility-administered medications for this visit.      Past Medical History:  Diagnosis Date  . CHF (congestive heart failure) ([place])   . Chronic kidney disease    kidney fx studies increased   . Chronic lower back pain   . Claustrophobia   . Dysrhythmia    "palpitations"  . Exertional dyspnea 01/2012  . Heart murmur   . Hypertension   . Hypothyroidism   . Migraine 02/13/12   "opthalmic"  . Varicose vein of leg    right    ROS:   All systems reviewed and negative except as noted in the HPI.   Past Surgical History:  Procedure Laterality Date  . CARDIAC CATHETERIZATION N/A 02/17/2016   Procedure: Left Heart Cath and Coronary Angiography;  Surgeon: Jettie Booze, MD;  Location: [place] [facility];  Service: Cardiovascular;  Laterality: N/A;  . CARDIOVERSION  03/22/2012   Procedure: CARDIOVERSION;  Surgeon: Candee Furbish, MD;  Location: [facility] ENDOSCOPY;  Service: Cardiovascular;  Laterality: N/A;  . CARDIOVERSION  04/19/2012   Procedure: CARDIOVERSION;  Surgeon: Sinclair Grooms, MD;  Location: [place];  Service: Cardiovascular;  Laterality: N/A;  . COLONOSCOPY N/A 04/11/2013   Procedure: COLONOSCOPY;  Surgeon: Inda Castle, MD;  Location: [facility] ENDOSCOPY;  Service: Endoscopy;  Laterality: N/A;  . COLONOSCOPY N/A 04/11/2013   Procedure: COLONOSCOPY;  Surgeon: Inda Castle, MD;  Location: [facility] ENDOSCOPY;  Service: Endoscopy;  Laterality: N/A;  . EP IMPLANTABLE DEVICE N/A 02/17/2016   Procedure: BiV ICD Insertion CRT-D;  Surgeon: Evans Lance, MD;  Location: [place] [facility];  Service: Cardiovascular;  Laterality: N/A;  . FINGER SURGERY  2012   "4th digit right hand; thumb on left hand"  . RADIOLOGY WITH ANESTHESIA N/A 02/11/2016   Procedure: MRI OF THE BRAIN WITHOUT CONTRAST, LUMBAR WITHOUT CONTRAST;  Surgeon: Medication Radiologist, MD;  Location: [place];  Service: Radiology;  Laterality: N/A;  DR. WOOD/MRI  . RIGHT/LEFT HEART CATH AND CORONARY ANGIOGRAPHY N/A 07/26/2018   Procedure: RIGHT/LEFT HEART CATH AND CORONARY ANGIOGRAPHY;  Surgeon: Belva Crome, MD;  Location: [place] CV [facility];  Service: Cardiovascular;  Laterality: N/A;  . Skin melanocytoma excision  2012   "above left clavicle"  . TEE WITHOUT CARDIOVERSION  03/22/2012   Procedure: TRANSESOPHAGEAL ECHOCARDIOGRAM (TEE);  Surgeon: Candee Furbish, MD;  Location: [facility] ENDOSCOPY;  Service: Cardiovascular;  Laterality: N/A;  . TEE WITHOUT CARDIOVERSION N/A 02/08/2016   Procedure: TRANSESOPHAGEAL ECHOCARDIOGRAM (TEE);  Surgeon: Lelon Perla, MD;  Location: [facility] ENDOSCOPY;  Service: Cardiovascular;  Laterality: N/A;     Family History  Problem Relation Age of Onset  . Hypertension Mother   . Heart disease Mother   . Heart  failure Mother   . Diabetes Mother   . Hypertension Father   . Heart disease Father   . Heart failure Father      Social History   Socioeconomic History  . Marital status: Married    Spouse name: [name]  . Number of children: 1  . Years of education: Not on file  . Highest education level: Not on file  Occupational History    Employer: [employer]  Social Needs  . Financial resource strain: Not on file  . Food insecurity:    Worry: Not on file    Inability: Not on file  . Transportation needs:    Medical: Not  on file    Non-medical: Not on file  Tobacco Use  . Smoking status: Never Smoker  . Smokeless tobacco: Never Used  Substance and Sexual Activity  . Alcohol use: Yes    Comment: 02/13/12 "socially"  . Drug use: No  . Sexual activity: Not Currently  Lifestyle  . Physical activity:    Days per week: Not on file    Minutes per session: Not on file  . Stress: Not on file  Relationships  . Social connections:    Talks on phone: Not on file    Gets together: Not on file    Attends religious service: Not on file    Active member of club or organization: Not on file    Attends meetings of clubs or organizations: Not on file    Relationship status: Not on file  . Intimate partner violence:    Fear of current or ex partner: Not on file    Emotionally abused: Not on file    Physically abused: Not on file    Forced sexual activity: Not on file  Other Topics Concern  . 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     BP 134/62   Pulse 70   Ht 5\' 9"  (1.753 m)   Wt 240 lb (108.9 kg)   SpO2 97%   BMI 35.44 kg/m   Physical Exam:  Well appearing NAD HEENT: Unremarkable Neck:  6 cm JVD, no thyromegally Lymphatics:  No adenopathy Back:  No CVA tenderness Lungs:  Clear with no wheezes HEART:  Regular rate rhythm, soft AI/AS murmurs, no rubs, no clicks Abd:  soft, positive  bowel sounds, no organomegally, no rebound, no guarding Ext:  2 plus pulses, no edema, no cyanosis, no clubbing Skin:  No rashes no nodules Neuro:  CN II through XII intact, motor grossly intact   DEVICE  Normal device function.  See PaceArt for details.   Assess/Plan: 1. AI - he is pending valve replacement. We discussed mechanical vs tissue valve. He is leaning toward tissue valve which I think is reasonable.  2. Atrial fib - I have recommended that he undergo MAZE procedure at the time of his AVR unless his surgeon thinks that the MAZE would make the risk of the procedure prohibitive.  3. VF - he is s/p ICD and has had no recurrent episodes.  4. ICD - his Prospect ICD has been interrogated today and appears to be working normally. Thresholds look good.  Mikle Bosworth.D.

## 2018-10-29 NOTE — Patient Instructions (Addendum)
Medication Instructions:  Your physician recommends that you continue on your current medications as directed. Please refer to the Current Medication list given to you today.  Labwork: None ordered.  Testing/Procedures: None ordered.  Follow-Up: Your physician wants you to follow-up in: 3 months with Dr. Lovena Le.   PHYS ICD visit  Remote monitoring is used to monitor your ICD from home. This monitoring reduces the number of office visits required to check your device to one time per year. It allows [place] to keep an eye on the functioning of your device to ensure it is working properly. You are scheduled for a device check from home on 02/03/2019. You may send your transmission at any time that day. If you have a wireless device, the transmission will be sent automatically. After your physician reviews your transmission, you will receive a postcard with your next transmission date.  Any Other Special Instructions Will Be Listed Below (If Applicable).  If you need a refill on your cardiac medications before your next appointment, please call your pharmacy.

## 2018-10-30 LAB — CUP PACEART INCLINIC DEVICE CHECK
Battery Remaining Longevity: 58 mo
Brady Statistic RA Percent Paced: 0 %
Date Time Interrogation Session: 20200204222638
HighPow Impedance: 69.75 Ohm
Implantable Lead Implant Date: 20170525
Implantable Lead Implant Date: 20170525
Implantable Lead Implant Date: 20170525
Implantable Lead Location: 753858
Implantable Lead Location: 753859
Implantable Lead Model: 7122
Implantable Pulse Generator Implant Date: 20170525
Lead Channel Impedance Value: 437.5 Ohm
Lead Channel Impedance Value: 637.5 Ohm
Lead Channel Pacing Threshold Amplitude: 0.625 V
Lead Channel Pacing Threshold Amplitude: 1.375 V
Lead Channel Pacing Threshold Pulse Width: 0.5 ms
Lead Channel Pacing Threshold Pulse Width: 0.5 ms
Lead Channel Sensing Intrinsic Amplitude: 0.5 mV
Lead Channel Setting Pacing Amplitude: 2 V
Lead Channel Setting Pacing Amplitude: 2 V
Lead Channel Setting Pacing Amplitude: 2.375
Lead Channel Setting Pacing Pulse Width: 0.5 ms
Lead Channel Setting Sensing Sensitivity: 0.5 mV
MDC IDC LEAD LOCATION: 753860
MDC IDC MSMT LEADCHNL RV IMPEDANCE VALUE: 425 Ohm
MDC IDC MSMT LEADCHNL RV SENSING INTR AMPL: 12 mV
MDC IDC SET LEADCHNL RV PACING PULSEWIDTH: 0.5 ms
MDC IDC STAT BRADY RV PERCENT PACED: 94 %
Pulse Gen Serial Number: 7357926

## 2018-11-09 ENCOUNTER — Other Ambulatory Visit: Payer: Self-pay | Admitting: Interventional Cardiology

## 2018-11-12 DIAGNOSIS — I351 Nonrheumatic aortic (valve) insufficiency: Secondary | ICD-10-CM | POA: Diagnosis not present

## 2018-11-12 DIAGNOSIS — I7789 Other specified disorders of arteries and arterioles: Secondary | ICD-10-CM | POA: Diagnosis not present

## 2018-11-18 DIAGNOSIS — I480 Paroxysmal atrial fibrillation: Secondary | ICD-10-CM | POA: Diagnosis present

## 2018-11-18 DIAGNOSIS — R931 Abnormal findings on diagnostic imaging of heart and coronary circulation: Secondary | ICD-10-CM | POA: Diagnosis not present

## 2018-11-18 DIAGNOSIS — Z9889 Other specified postprocedural states: Secondary | ICD-10-CM | POA: Diagnosis not present

## 2018-11-18 DIAGNOSIS — I350 Nonrheumatic aortic (valve) stenosis: Secondary | ICD-10-CM | POA: Diagnosis present

## 2018-11-18 DIAGNOSIS — D62 Acute posthemorrhagic anemia: Secondary | ICD-10-CM | POA: Diagnosis not present

## 2018-11-18 DIAGNOSIS — I719 Aortic aneurysm of unspecified site, without rupture: Secondary | ICD-10-CM | POA: Diagnosis not present

## 2018-11-18 DIAGNOSIS — I2510 Atherosclerotic heart disease of native coronary artery without angina pectoris: Secondary | ICD-10-CM | POA: Diagnosis not present

## 2018-11-18 DIAGNOSIS — I130 Hypertensive heart and chronic kidney disease with heart failure and stage 1 through stage 4 chronic kidney disease, or unspecified chronic kidney disease: Secondary | ICD-10-CM | POA: Diagnosis present

## 2018-11-18 DIAGNOSIS — J90 Pleural effusion, not elsewhere classified: Secondary | ICD-10-CM | POA: Diagnosis not present

## 2018-11-18 DIAGNOSIS — N179 Acute kidney failure, unspecified: Secondary | ICD-10-CM | POA: Diagnosis present

## 2018-11-18 DIAGNOSIS — R918 Other nonspecific abnormal finding of lung field: Secondary | ICD-10-CM | POA: Diagnosis not present

## 2018-11-18 DIAGNOSIS — Z95810 Presence of automatic (implantable) cardiac defibrillator: Secondary | ICD-10-CM | POA: Diagnosis not present

## 2018-11-18 DIAGNOSIS — I5023 Acute on chronic systolic (congestive) heart failure: Secondary | ICD-10-CM | POA: Diagnosis not present

## 2018-11-18 DIAGNOSIS — I712 Thoracic aortic aneurysm, without rupture: Secondary | ICD-10-CM | POA: Diagnosis not present

## 2018-11-18 DIAGNOSIS — I5022 Chronic systolic (congestive) heart failure: Secondary | ICD-10-CM | POA: Diagnosis present

## 2018-11-18 DIAGNOSIS — E785 Hyperlipidemia, unspecified: Secondary | ICD-10-CM | POA: Diagnosis present

## 2018-11-18 DIAGNOSIS — G8918 Other acute postprocedural pain: Secondary | ICD-10-CM | POA: Diagnosis not present

## 2018-11-18 DIAGNOSIS — J9811 Atelectasis: Secondary | ICD-10-CM | POA: Diagnosis not present

## 2018-11-18 DIAGNOSIS — J95821 Acute postprocedural respiratory failure: Secondary | ICD-10-CM | POA: Diagnosis not present

## 2018-11-18 DIAGNOSIS — I509 Heart failure, unspecified: Secondary | ICD-10-CM | POA: Diagnosis not present

## 2018-11-18 DIAGNOSIS — N184 Chronic kidney disease, stage 4 (severe): Secondary | ICD-10-CM | POA: Diagnosis present

## 2018-11-18 DIAGNOSIS — E039 Hypothyroidism, unspecified: Secondary | ICD-10-CM | POA: Diagnosis present

## 2018-11-18 DIAGNOSIS — Z8679 Personal history of other diseases of the circulatory system: Secondary | ICD-10-CM | POA: Diagnosis not present

## 2018-11-18 DIAGNOSIS — I428 Other cardiomyopathies: Secondary | ICD-10-CM | POA: Diagnosis present

## 2018-11-18 DIAGNOSIS — E1122 Type 2 diabetes mellitus with diabetic chronic kidney disease: Secondary | ICD-10-CM | POA: Diagnosis present

## 2018-11-18 DIAGNOSIS — I517 Cardiomegaly: Secondary | ICD-10-CM | POA: Diagnosis not present

## 2018-11-18 DIAGNOSIS — I351 Nonrheumatic aortic (valve) insufficiency: Secondary | ICD-10-CM | POA: Diagnosis not present

## 2018-11-18 DIAGNOSIS — D696 Thrombocytopenia, unspecified: Secondary | ICD-10-CM | POA: Diagnosis present

## 2018-11-18 DIAGNOSIS — Q2543 Congenital aneurysm of aorta: Secondary | ICD-10-CM | POA: Diagnosis not present

## 2018-11-18 DIAGNOSIS — Z9911 Dependence on respirator [ventilator] status: Secondary | ICD-10-CM | POA: Diagnosis not present

## 2018-11-18 DIAGNOSIS — N189 Chronic kidney disease, unspecified: Secondary | ICD-10-CM | POA: Diagnosis not present

## 2018-11-18 DIAGNOSIS — Z4682 Encounter for fitting and adjustment of non-vascular catheter: Secondary | ICD-10-CM | POA: Diagnosis not present

## 2018-11-18 DIAGNOSIS — J811 Chronic pulmonary edema: Secondary | ICD-10-CM | POA: Diagnosis not present

## 2018-11-18 DIAGNOSIS — E871 Hypo-osmolality and hyponatremia: Secondary | ICD-10-CM | POA: Diagnosis present

## 2018-11-19 DIAGNOSIS — Z95810 Presence of automatic (implantable) cardiac defibrillator: Secondary | ICD-10-CM | POA: Insufficient documentation

## 2018-11-19 DIAGNOSIS — I428 Other cardiomyopathies: Secondary | ICD-10-CM | POA: Insufficient documentation

## 2018-11-20 HISTORY — PX: AORTIC VALVE REPLACEMENT: SHX41

## 2018-11-23 DIAGNOSIS — Z8679 Personal history of other diseases of the circulatory system: Secondary | ICD-10-CM | POA: Insufficient documentation

## 2018-11-23 DIAGNOSIS — Z9889 Other specified postprocedural states: Secondary | ICD-10-CM

## 2018-11-23 DIAGNOSIS — Z952 Presence of prosthetic heart valve: Secondary | ICD-10-CM | POA: Insufficient documentation

## 2018-11-24 HISTORY — PX: STERNAL WOUND DEBRIDEMENT: SHX1058

## 2018-11-24 HISTORY — PX: STERNAL INCISION RECLOSURE: SHX2442

## 2018-11-24 HISTORY — PX: STERNAL WIRE REMOVAL: SHX2440

## 2018-11-26 ENCOUNTER — Other Ambulatory Visit: Payer: Self-pay | Admitting: Interventional Cardiology

## 2018-11-29 ENCOUNTER — Telehealth: Payer: Self-pay | Admitting: Interventional Cardiology

## 2018-11-29 MED ORDER — FUROSEMIDE 40 MG PO TABS
40.00 | ORAL_TABLET | ORAL | Status: DC
Start: 2018-11-29 — End: 2018-11-29

## 2018-11-29 MED ORDER — PANTOPRAZOLE SODIUM 40 MG PO TBEC
40.00 | DELAYED_RELEASE_TABLET | ORAL | Status: DC
Start: 2018-11-30 — End: 2018-11-29

## 2018-11-29 MED ORDER — INSULIN REGULAR HUMAN 100 UNIT/ML IJ SOLN
0.00 | INTRAMUSCULAR | Status: DC
Start: 2018-11-29 — End: 2018-11-29

## 2018-11-29 MED ORDER — ASPIRIN 81 MG PO CHEW
81.00 | CHEWABLE_TABLET | ORAL | Status: DC
Start: 2018-11-30 — End: 2018-11-29

## 2018-11-29 MED ORDER — ROSUVASTATIN CALCIUM 10 MG PO TABS
10.00 | ORAL_TABLET | ORAL | Status: DC
Start: 2018-11-29 — End: 2018-11-29

## 2018-11-29 MED ORDER — HYDRALAZINE HCL 10 MG PO TABS
10.00 | ORAL_TABLET | ORAL | Status: DC
Start: 2018-11-29 — End: 2018-11-29

## 2018-11-29 MED ORDER — POTASSIUM CHLORIDE 20 MEQ/15ML (10%) PO SOLN
20.00 | ORAL | Status: DC
Start: 2018-11-29 — End: 2018-11-29

## 2018-11-29 MED ORDER — FLUTICASONE PROPIONATE 50 MCG/ACT NA SUSP
2.00 | NASAL | Status: DC
Start: ? — End: 2018-11-29

## 2018-11-29 MED ORDER — GUAIFENESIN ER 600 MG PO TB12
600.00 | ORAL_TABLET | ORAL | Status: DC
Start: 2018-11-29 — End: 2018-11-29

## 2018-11-29 MED ORDER — ACETAMINOPHEN 325 MG PO TABS
650.00 | ORAL_TABLET | ORAL | Status: DC
Start: 2018-11-29 — End: 2018-11-29

## 2018-11-29 MED ORDER — AMIODARONE HCL 200 MG PO TABS
200.00 | ORAL_TABLET | ORAL | Status: DC
Start: 2018-11-30 — End: 2018-11-29

## 2018-11-29 MED ORDER — LIDOCAINE HCL 1 % IJ SOLN
.50 | INTRAMUSCULAR | Status: DC
Start: ? — End: 2018-11-29

## 2018-11-29 MED ORDER — APIXABAN 5 MG PO TABS
5.00 | ORAL_TABLET | ORAL | Status: DC
Start: 2018-11-29 — End: 2018-11-29

## 2018-11-29 MED ORDER — CARVEDILOL 3.125 MG PO TABS
3.13 | ORAL_TABLET | ORAL | Status: DC
Start: 2018-11-29 — End: 2018-11-29

## 2018-11-29 MED ORDER — GENERIC EXTERNAL MEDICATION
Status: DC
Start: ? — End: 2018-11-29

## 2018-11-29 MED ORDER — SENNOSIDES-DOCUSATE SODIUM 8.6-50 MG PO TABS
2.00 | ORAL_TABLET | ORAL | Status: DC
Start: 2018-11-29 — End: 2018-11-29

## 2018-11-29 MED ORDER — OXYBUTYNIN CHLORIDE 5 MG PO TABS
5.00 | ORAL_TABLET | ORAL | Status: DC
Start: ? — End: 2018-11-29

## 2018-11-29 MED ORDER — LEVOTHYROXINE SODIUM 75 MCG PO TABS
37.50 | ORAL_TABLET | ORAL | Status: DC
Start: 2018-12-01 — End: 2018-11-29

## 2018-11-29 MED ORDER — ONDANSETRON HCL 4 MG/2ML IJ SOLN
4.00 | INTRAMUSCULAR | Status: DC
Start: ? — End: 2018-11-29

## 2018-11-29 MED ORDER — DEXTROSE 50 % IV SOLN
12.50 | INTRAVENOUS | Status: DC
Start: ? — End: 2018-11-29

## 2018-11-29 MED ORDER — POLYETHYLENE GLYCOL 3350 17 G PO PACK
17.00 | PACK | ORAL | Status: DC
Start: 2018-11-30 — End: 2018-11-29

## 2018-11-29 MED ORDER — LEVOTHYROXINE SODIUM 75 MCG PO TABS
75.00 | ORAL_TABLET | ORAL | Status: DC
Start: 2018-11-30 — End: 2018-11-29

## 2018-11-29 MED ORDER — GLUCAGON HCL RDNA (DIAGNOSTIC) 1 MG IJ SOLR
1.00 | INTRAMUSCULAR | Status: DC
Start: ? — End: 2018-11-29

## 2018-11-29 MED ORDER — NYSTATIN 100000 UNIT/ML MT SUSP
5.00 | OROMUCOSAL | Status: DC
Start: 2018-11-29 — End: 2018-11-29

## 2018-11-29 MED ORDER — OXYCODONE HCL 5 MG PO TABS
5.00 | ORAL_TABLET | ORAL | Status: DC
Start: ? — End: 2018-11-29

## 2018-11-29 NOTE — Telephone Encounter (Signed)
Spoke with pt and scheduled him to see Dr. Tamala Julian the same day he sees Lovena Le on 3/12.  Pt verbalized understanding and was in agreement with this plan.

## 2018-11-29 NOTE — Telephone Encounter (Signed)
Dr. 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  Pt needs appt to see Dr. Tamala Julian next week.  Will reach out to [name] later today to set up appt.

## 2018-11-29 NOTE — Telephone Encounter (Signed)
I spoke with Dr Maricela Bo [facility] [phone]).  He would like a call from Dr Tamala Julian at his convenience to discuss patient.

## 2018-11-29 NOTE — Telephone Encounter (Signed)
New message   Dr. Maricela Bo would like a call back in reference to this patient.

## 2018-12-04 ENCOUNTER — Ambulatory Visit (HOSPITAL_COMMUNITY): Payer: Self-pay | Admitting: *Deleted

## 2018-12-04 NOTE — Progress Notes (Signed)
Received referral for this pt to participate in cardiac rehab s/p AV Replacement and Aneurysm repair at [facility] on 11/23/2018 by Dr. Jimmye Norman that is signed by NP - Melrose Nakayama.  Pt has upcoming Cardiac surgery appt on 3/24  At [facility] and will be seen locally by Dr. Tamala Julian on 3/12 and Dr. Lovena Le for pacer check.  Will check with Dr. Tamala Julian to see if he would be willing to place CR referral.  Will also need 12 lead ekg tracing from [facility] unless completed during follow up visits by [facility] cardiologist. Will continue to follow pt for readiness to proceed with exercise. Cherre Huger, BSN Cardiac and Training and development officer

## 2018-12-05 ENCOUNTER — Encounter: Payer: Self-pay | Admitting: Interventional Cardiology

## 2018-12-05 ENCOUNTER — Ambulatory Visit (INDEPENDENT_AMBULATORY_CARE_PROVIDER_SITE_OTHER): Payer: Medicare Other | Admitting: Internal Medicine

## 2018-12-05 ENCOUNTER — Ambulatory Visit (INDEPENDENT_AMBULATORY_CARE_PROVIDER_SITE_OTHER): Payer: Medicare Other | Admitting: Interventional Cardiology

## 2018-12-05 ENCOUNTER — Encounter: Payer: Self-pay | Admitting: Internal Medicine

## 2018-12-05 ENCOUNTER — Other Ambulatory Visit: Payer: Self-pay

## 2018-12-05 VITALS — BP 88/58 | HR 80 | Ht 69.0 in | Wt 232.0 lb

## 2018-12-05 VITALS — BP 98/58 | HR 80 | Ht 69.0 in | Wt 232.0 lb

## 2018-12-05 DIAGNOSIS — I5022 Chronic systolic (congestive) heart failure: Secondary | ICD-10-CM | POA: Diagnosis not present

## 2018-12-05 DIAGNOSIS — I10 Essential (primary) hypertension: Secondary | ICD-10-CM

## 2018-12-05 DIAGNOSIS — I351 Nonrheumatic aortic (valve) insufficiency: Secondary | ICD-10-CM | POA: Diagnosis not present

## 2018-12-05 DIAGNOSIS — I4901 Ventricular fibrillation: Secondary | ICD-10-CM | POA: Diagnosis not present

## 2018-12-05 DIAGNOSIS — I480 Paroxysmal atrial fibrillation: Secondary | ICD-10-CM

## 2018-12-05 DIAGNOSIS — N184 Chronic kidney disease, stage 4 (severe): Secondary | ICD-10-CM | POA: Diagnosis not present

## 2018-12-05 NOTE — Progress Notes (Signed)
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 He has LBBB and severe Lv dysfunction and underwent biv insertion at the time of his initial event. He has developed persistent atrial fib and worsening renal dysfunction. He has undergone aortic valve replacement. Because of renal insufficiency, he was not treated with a MAZE out of concerns of worsening kidney function. He went back to atrial fib after the surgery.  Allergies  Allergen Reactions  . Lactose Intolerance (Gi) Diarrhea     Current Outpatient Medications  Medication Sig Dispense Refill  . acetaminophen (TYLENOL) 500 MG tablet Take 1,000 mg by mouth every 4 (four) hours as needed for moderate pain or headache.     Marland Kitchen amiodarone (PACERONE) 200 MG tablet Take 200 mg by mouth daily.    Marland Kitchen apixaban (ELIQUIS) 5 MG TABS tablet Take 1 tablet (5 mg total) by mouth 2 (two) times daily. 60 tablet 11  . aspirin 81 MG chewable tablet Chew 81 mg by mouth daily.    . carvedilol (COREG) 3.125 MG tablet Take 3.125 mg by mouth 2 (two) times daily with a meal.    . docusate sodium (COLACE) 100 MG capsule Take 100 mg by mouth daily as needed for mild constipation.    . fluticasone (FLONASE) 50 MCG/ACT nasal spray Place 2 sprays into the nose as needed for allergies.     . furosemide (LASIX) 40 MG tablet Take 40 mg by mouth 3 (three) times daily.    . hydrALAZINE (APRESOLINE) 10 MG tablet Take 10 mg by mouth 3 (three) times daily.    . Hypromellose (ARTIFICIAL TEARS OP) Place 1 drop into both eyes daily as needed (dry eyes).    Marland Kitchen ipratropium (ATROVENT) 0.06 % nasal spray Place 2 sprays into both nostrils 4 (four) times daily.    Marland Kitchen levothyroxine (SYNTHROID, LEVOTHROID) 75 MCG tablet Take 1 tablet (75 mcg total) by mouth See admin instructions. Take 75 mcg daily in the morning except on Sundays take 37.5 mcg in the morning 90 tablet 1  .  oxyCODONE (OXY IR/ROXICODONE) 5 MG immediate release tablet Take 5 mg by mouth as needed.    . potassium chloride 20 MEQ/15ML (10%) SOLN Take by mouth.    . rosuvastatin (CRESTOR) 20 MG tablet TAKE 1 TABLET BY MOUTH DAILY 90 tablet 3  . senna-docusate (SENOKOT-S) 8.6-50 MG tablet Take by mouth.    . vitamin B-12 (CYANOCOBALAMIN) 100 MCG tablet Take 100 mcg by mouth 3 (three) times a week.     . vitamin C (ASCORBIC ACID) 500 MG tablet Take 500 mg by mouth 3 (three) times a week.    Marland Kitchen ZINC-VITAMIN C PO Take 1 tablet by mouth every other day.     No current facility-administered medications for this visit.      Past Medical History:  Diagnosis Date  . Acute blood loss anemia   . Acute encephalopathy   . Acute hypoxemic respiratory failure ([place])   . Acute idiopathic gout of right ankle   . Acute lumbar back pain   . Acute on chronic systolic heart failure ([place])   . Acute respiratory failure ([place])   . AKI (acute kidney injury) ([place])   . Altered mental status   . Aortic aneurysm without rupture ([place]) 02/09/2016  . Aortic root enlargement ([place]) 02/13/2012  . Aortic valve regurgitation, acquired 02/13/2012  . Arrhythmia   .  Atrial flutter ([place]) 03/18/2012  . Back pain   . Cardiac arrest ([place]) 02/01/2016  . CHF (congestive heart failure) ([place])   . Chronic anticoagulation 03/04/2013  . Chronic kidney disease    kidney fx studies increased   . Chronic lower back pain   . Chronic renal insufficiency   . Chronic systolic heart failure ([place]) 02/13/2012   Recent diagnosis 4 / 2013, LVEF 25% by Echo 12/2011  03/2013: Echo at [facility] Conclusions: 1. Left ventricular ejection fraction estimated by 2D at 40-45 percent. 2. Mild concentric left ventricular hypertrophy. 3. Mild left atrial enlargement. 4. Moderate aortic valve regurgitation. 5. The aortic root at the sinus(es) of valsalva is moderately dilated 6. Mild mitral valve regurgitation. 7.  . CKD (chronic kidney disease)   . CKD (chronic  kidney disease), stage IV ([place]) 08/06/2013   Creatinine 2.4 on 07/04/13   . Claustrophobia   . Colon cancer screening 03/04/2013  . Debility 02/22/2016  . Diabetes mellitus type 2 in nonobese (HCC)   . Diabetes mellitus type 2 in obese ([place])   . Dysrhythmia    "palpitations"  . Encounter for central line placement   . Exertional dyspnea 01/2012  . 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 HCAP (healthcare-associated pneumonia)   . Heart murmur   . Hyperlipidemia 02/13/2012  . Hypertension   . Hypothyroidism   . Internal hemorrhoids without mention of complication 01/01/8118  . Labile blood pressure   . Left bundle branch block 02/13/2012  . Leg weakness, bilateral   . Long term (current) use of anticoagulants 08/06/2013   Eliquis therapy   . Long term current use of amiodarone 08/07/2016  . Lower extremity weakness   . Migraine 02/13/12   "opthalmic"  . Non-traumatic rhabdomyolysis   . Obesity (BMI 30-39.9) 02/13/2012  . Pain   . Paroxysmal atrial fibrillation (HCC)   . Pneumonia   . Retroperitoneal bleed   . Right ankle pain   . Right knee pain   . Severe aortic regurgitation 02/09/2016  . Special screening for malignant neoplasms, colon 04/11/2013  . Thyroid activity decreased   . Tibial pain   . Varicose vein of leg    right  . Ventricular fibrillation ([place]) 02/09/2016  . Weakness of both lower extremities     ROS:   All systems reviewed and negative except as noted in the HPI.   Past Surgical History:  Procedure Laterality Date  . CARDIAC CATHETERIZATION N/A 02/17/2016   Procedure: Left Heart Cath and Coronary Angiography;  Surgeon: Jettie Booze, MD;  Location: [place] [facility];  Service: Cardiovascular;  Laterality: N/A;  . CARDIOVERSION  03/22/2012   Procedure: CARDIOVERSION;  Surgeon: Candee Furbish, MD;  Location: [facility] ENDOSCOPY;  Service: Cardiovascular;  Laterality: N/A;  . CARDIOVERSION  04/19/2012   Procedure: CARDIOVERSION;  Surgeon: Sinclair Grooms, MD;  Location: [place];  Service: Cardiovascular;  Laterality: N/A;  . COLONOSCOPY N/A 04/11/2013   Procedure: COLONOSCOPY;  Surgeon: Inda Castle, MD;  Location: [facility] ENDOSCOPY;  Service: Endoscopy;  Laterality: N/A;  . COLONOSCOPY N/A 04/11/2013   Procedure: COLONOSCOPY;  Surgeon: Inda Castle, MD;  Location: [facility] ENDOSCOPY;  Service: Endoscopy;  Laterality: N/A;  . EP IMPLANTABLE DEVICE N/A 02/17/2016   Procedure: BiV ICD Insertion CRT-D;  Surgeon: Evans Lance, MD;  Location: [place] [facility];  Service: Cardiovascular;  Laterality: N/A;  . FINGER SURGERY  2012   "4th digit right hand;  thumb on left hand"  . RADIOLOGY WITH ANESTHESIA N/A 02/11/2016   Procedure: MRI OF THE BRAIN WITHOUT CONTRAST, LUMBAR WITHOUT CONTRAST;  Surgeon: Medication Radiologist, MD;  Location: [place];  Service: Radiology;  Laterality: N/A;  DR. WOOD/MRI  . RIGHT/LEFT HEART CATH AND CORONARY ANGIOGRAPHY N/A 07/26/2018   Procedure: RIGHT/LEFT HEART CATH AND CORONARY ANGIOGRAPHY;  Surgeon: Belva Crome, MD;  Location: [place] CV LAB;  Service: Cardiovascular;  Laterality: N/A;  . Skin melanocytoma excision  2012   "above left clavicle"  . TEE WITHOUT CARDIOVERSION  03/22/2012   Procedure: TRANSESOPHAGEAL ECHOCARDIOGRAM (TEE);  Surgeon: Candee Furbish, MD;  Location: [facility] ENDOSCOPY;  Service: Cardiovascular;  Laterality: N/A;  . TEE WITHOUT CARDIOVERSION N/A 02/08/2016   Procedure: TRANSESOPHAGEAL ECHOCARDIOGRAM (TEE);  Surgeon: Lelon Perla, MD;  Location: [facility] ENDOSCOPY;  Service: Cardiovascular;  Laterality: N/A;     Family History  Problem Relation Age of Onset  . Hypertension Mother   . Heart disease Mother   . Heart failure Mother   . Diabetes Mother   . Hypertension Father   . Heart disease Father   . Heart failure Father      Social History   Socioeconomic History  . Marital status: Married    Spouse name: [name]  . Number of children: 1  . Years of education: Not on file  . Highest  education level: Not on file  Occupational History    Employer: [employer]  Social Needs  . Financial resource strain: Not on file  . Food insecurity:    Worry: Not on file    Inability: Not on file  . Transportation needs:    Medical: Not on file    Non-medical: Not on file  Tobacco Use  . Smoking status: Never Smoker  . Smokeless tobacco: Never Used  Substance and Sexual Activity  . Alcohol use: Yes    Comment: 02/13/12 "socially"  . Drug use: No  . Sexual activity: Not Currently  Lifestyle  . Physical activity:    Days per week: Not on file    Minutes per session: Not on file  . Stress: Not on file  Relationships  . Social connections:    Talks on phone: Not on file    Gets together: Not on file    Attends religious service: Not on file    Active member of club or organization: Not on file    Attends meetings of clubs or organizations: Not on file    Relationship status: Not on file  . Intimate partner violence:    Fear of current or ex partner: Not on file    Emotionally abused: Not on file    Physically abused: Not on file    Forced sexual activity: Not on file  Other Topics Concern  . 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     BP (!) 98/58   Pulse 80   Ht 5\' 9"  (1.753 m)   Wt 232 lb (105.2 kg)   SpO2 96%   BMI 34.26 kg/m   Physical Exam:  Well appearing NAD HEENT: Unremarkable Neck:  No JVD, no thyromegally Lymphatics:  No adenopathy Back:  No CVA tenderness Lungs:  Clear with no effusions appreciated. HEART:  IRegular rate rhythm, no murmurs, no rubs, no clicks Abd:  soft, positive bowel sounds, no organomegally, no rebound, no guarding Ext:  2 plus  pulses, no edema, no cyanosis, no clubbing Skin:  No rashes no nodules Neuro:  CN II through XII intact, motor grossly intact   DEVICE  Normal device function.  See PaceArt for details.   Assess/Plan: 1.  Persistent atrial fib - he will continue his current meds but I would like for him to continue Eliquis and amiodarone. We will tentatively plan for DCCV in the next few months.  2. AI - he is s/p AVR and appears to be healing up nicely. 3. VF - he has had no additional ventricular arrhythmias. 4. ICD - his St. Jude device is working normally. No change.  Mikle Bosworth.D.

## 2018-12-05 NOTE — Patient Instructions (Addendum)
Medication Instructions:  1) DO NOT take your Furosemide tonight  If you need a refill on your cardiac medications before your next appointment, please call your pharmacy.   Lab work: BMET and Pro BNP today  If you have labs (blood work) drawn today and your tests are completely normal, you will receive your results only by: Marland Kitchen MyChart Message (if you have MyChart) OR . A paper copy in the mail If you have any lab test that is abnormal or we need to change your treatment, we will call you to review the results.  Testing/Procedures: None  Follow-Up: At [facility], you and your health needs are our priority.  As part of our continuing mission to provide you with exceptional heart care, we have created designated Provider Care Teams.  These Care Teams include your primary Cardiologist (physician) and Advanced Practice Providers (APPs -  Physician Assistants and Nurse Practitioners) who all work together to provide you with the care you need, when you need it. You will need a follow up appointment in 1 month (Can have 4/20 at 10:20A) .  Please call our office 2 months in advance to schedule this appointment.  You may see Dr. Tamala Julian or one of the following Advanced Practice Providers on your designated Care Team:   Truitt Merle, NP Cecilie Kicks, NP . Kathyrn Drown, NP  Any Other Special Instructions Will Be Listed Below (If Applicable).

## 2018-12-05 NOTE — Patient Instructions (Signed)
Medication Instructions:  No changes If you need a refill on your cardiac medications before your next appointment, please call your pharmacy.   Lab work: As directed by Dr. Tamala Julian today, none for Dr. Tamala Julian If you have labs (blood work) drawn today and your tests are completely normal, you will receive your results only by: Marland Kitchen MyChart Message (if you have MyChart) OR . A paper copy in the mail If you have any lab test that is abnormal or we need to change your treatment, we will call you to review the results.  Testing/Procedures: none  Follow-Up: Your physician recommends that you schedule a follow-up appointment in: 2 months with Dr. Lovena Le.  Any Other Special Instructions Will Be Listed Below (If Applicable).

## 2018-12-05 NOTE — Progress Notes (Addendum)
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   Referring MD: Glendale Chard, MD   Chief Complaint  Patient presents with  . Coronary Artery Disease  . Cardiac Valve Problem  . 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  [name] had aortic aneurysm resection, bioprosthetic aortic valve implantation, and single-vessel coronary bypass grafting performed at [facility] by Dr. Jimmye Norman.  He had a reasonable hospital stay.  He is here today for follow-up.  The hospital stay was complicated by relatively low blood pressures.  He also left the hospital with significant volume overload.  Dr. Jimmye Norman wanted him to be seen 3 to 5 days after discharge to assess his clinical status.  He was discharged on furosemide 40 mg every 8 hours.  [name] states that his weight is gradually decreased but he does not have accurate records.  States that the lower extremity swelling has significantly decreased.  He denies chest pain, orthopnea, PND.  Appetite is been reasonable.  He denies chills and fever.  He is sleeping well.  He denies incisional discomfort.  He has had no significant irregularities in heart rhythm and is not noted tachycardia.  Past Medical History:  Diagnosis Date  . Acute blood loss anemia    . Acute encephalopathy   . Acute hypoxemic respiratory failure ([place])   . Acute idiopathic gout of right ankle   . Acute lumbar back pain   . Acute on chronic systolic heart failure ([place])   . Acute respiratory failure ([place])   . AKI (acute kidney injury) ([place])   . Altered mental status   . Aortic aneurysm without rupture ([place]) 02/09/2016  . Aortic root enlargement ([place]) 02/13/2012  . Aortic valve regurgitation, acquired 02/13/2012  . Arrhythmia   . Atrial flutter ([place]) 03/18/2012  . Back pain   . Cardiac arrest ([place]) 02/01/2016  . CHF (congestive heart failure) ([place])   . Chronic anticoagulation 03/04/2013  . Chronic kidney disease    kidney fx studies increased   . Chronic lower back pain   . Chronic renal insufficiency   . Chronic systolic heart failure ([place]) 02/13/2012   Recent diagnosis 4 / 2013, LVEF 25% by Echo 12/2011  03/2013: Echo at [facility] Cardiology Conclusions: 1. Left ventricular ejection fraction estimated by 2D at 40-45 percent. 2. Mild concentric left ventricular hypertrophy. 3. Mild left atrial enlargement. 4. Moderate aortic valve regurgitation. 5. The aortic root at the sinus(es) of valsalva is moderately dilated 6. Mild mitral valve regurgitation. 7.  . CKD (chronic kidney disease)   . CKD (chronic kidney disease), stage IV ([place]) 08/06/2013   Creatinine 2.4 on 07/04/13   . Claustrophobia   . Colon cancer screening 03/04/2013  . Debility 02/22/2016  . Diabetes mellitus type 2 in nonobese (HCC)   . Diabetes mellitus type 2 in obese ([place])   . Dysrhythmia    "  palpitations"  . Encounter for central line placement   . Exertional dyspnea 01/2012  . 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 HCAP (healthcare-associated pneumonia)   . Heart murmur   . Hyperlipidemia 02/13/2012  . Hypertension   . Hypothyroidism   . Internal hemorrhoids without mention of complication 12/21/9240  . Labile blood pressure   . Left bundle branch block 02/13/2012  . Leg weakness,  bilateral   . Long term (current) use of anticoagulants 08/06/2013   Eliquis therapy   . Long term current use of amiodarone 08/07/2016  . Lower extremity weakness   . Migraine 02/13/12   "opthalmic"  . Non-traumatic rhabdomyolysis   . Obesity (BMI 30-39.9) 02/13/2012  . Pain   . Paroxysmal atrial fibrillation (HCC)   . Pneumonia   . Retroperitoneal bleed   . Right ankle pain   . Right knee pain   . Severe aortic regurgitation 02/09/2016  . Special screening for malignant neoplasms, colon 04/11/2013  . Thyroid activity decreased   . Tibial pain   . Varicose vein of leg    right  . Ventricular fibrillation ([place]) 02/09/2016  . Weakness of both lower extremities     Past Surgical History:  Procedure Laterality Date  . CARDIAC CATHETERIZATION N/A 02/17/2016   Procedure: Left Heart Cath and Coronary Angiography;  Surgeon: Jettie Booze, MD;  Location: [place] CV LAB;  Service: Cardiovascular;  Laterality: N/A;  . CARDIOVERSION  03/22/2012   Procedure: CARDIOVERSION;  Surgeon: Candee Furbish, MD;  Location: [facility] ENDOSCOPY;  Service: Cardiovascular;  Laterality: N/A;  . CARDIOVERSION  04/19/2012   Procedure: CARDIOVERSION;  Surgeon: Sinclair Grooms, MD;  Location: [place];  Service: Cardiovascular;  Laterality: N/A;  . COLONOSCOPY N/A 04/11/2013   Procedure: COLONOSCOPY;  Surgeon: Inda Castle, MD;  Location: [facility] ENDOSCOPY;  Service: Endoscopy;  Laterality: N/A;  . COLONOSCOPY N/A 04/11/2013   Procedure: COLONOSCOPY;  Surgeon: Inda Castle, MD;  Location: [facility] ENDOSCOPY;  Service: Endoscopy;  Laterality: N/A;  . EP IMPLANTABLE DEVICE N/A 02/17/2016   Procedure: BiV ICD Insertion CRT-D;  Surgeon: Evans Lance, MD;  Location: [place] CV LAB;  Service: Cardiovascular;  Laterality: N/A;  . FINGER SURGERY  2012   "4th digit right hand; thumb on left hand"  . RADIOLOGY WITH ANESTHESIA N/A 02/11/2016   Procedure: MRI OF THE BRAIN WITHOUT CONTRAST, LUMBAR WITHOUT CONTRAST;  Surgeon:  Medication Radiologist, MD;  Location: [place];  Service: Radiology;  Laterality: N/A;  DR. WOOD/MRI  . RIGHT/LEFT HEART CATH AND CORONARY ANGIOGRAPHY N/A 07/26/2018   Procedure: RIGHT/LEFT HEART CATH AND CORONARY ANGIOGRAPHY;  Surgeon: Belva Crome, MD;  Location: [place] CV LAB;  Service: Cardiovascular;  Laterality: N/A;  . Skin melanocytoma excision  2012   "above left clavicle"  . TEE WITHOUT CARDIOVERSION  03/22/2012   Procedure: TRANSESOPHAGEAL ECHOCARDIOGRAM (TEE);  Surgeon: Candee Furbish, MD;  Location: [facility] ENDOSCOPY;  Service: Cardiovascular;  Laterality: N/A;  . TEE WITHOUT CARDIOVERSION N/A 02/08/2016   Procedure: TRANSESOPHAGEAL ECHOCARDIOGRAM (TEE);  Surgeon: Lelon Perla, MD;  Location: [facility] ENDOSCOPY;  Service: Cardiovascular;  Laterality: N/A;    Current Medications: Current Meds  Medication Sig  . acetaminophen (TYLENOL) 500 MG tablet Take 1,000 mg by mouth every 4 (four) hours as needed for moderate pain or headache.   Marland Kitchen amiodarone (PACERONE) 200 MG tablet Take 200 mg by mouth daily.  Marland Kitchen apixaban (ELIQUIS) 5 MG TABS tablet Take 1 tablet (5  mg total) by mouth 2 (two) times daily.  Marland Kitchen aspirin 81 MG chewable tablet Chew 81 mg by mouth daily.  . carvedilol (COREG) 3.125 MG tablet Take 3.125 mg by mouth 2 (two) times daily with a meal.  . docusate sodium (COLACE) 100 MG capsule Take 100 mg by mouth daily as needed for mild constipation.  . fluticasone (FLONASE) 50 MCG/ACT nasal spray Place 2 sprays into the nose as needed for allergies.   . furosemide (LASIX) 40 MG tablet Take 40 mg by mouth 3 (three) times daily.  . hydrALAZINE (APRESOLINE) 10 MG tablet Take 10 mg by mouth 3 (three) times daily.  . Hypromellose (ARTIFICIAL TEARS OP) Place 1 drop into both eyes daily as needed (dry eyes).  Marland Kitchen ipratropium (ATROVENT) 0.06 % nasal spray Place 2 sprays into both nostrils 4 (four) times daily.  Marland Kitchen levothyroxine (SYNTHROID, LEVOTHROID) 75 MCG tablet Take 1 tablet (75 mcg total) by mouth  See admin instructions. Take 75 mcg daily in the morning except on Sundays take 37.5 mcg in the morning  . oxyCODONE (OXY IR/ROXICODONE) 5 MG immediate release tablet Take 5 mg by mouth as needed.  . potassium chloride 20 MEQ/15ML (10%) SOLN Take by mouth.  . rosuvastatin (CRESTOR) 20 MG tablet TAKE 1 TABLET BY MOUTH DAILY  . senna-docusate (SENOKOT-S) 8.6-50 MG tablet Take by mouth.  . vitamin B-12 (CYANOCOBALAMIN) 100 MCG tablet Take 100 mcg by mouth 3 (three) times a week.   . vitamin C (ASCORBIC ACID) 500 MG tablet Take 500 mg by mouth 3 (three) times a week.  Marland Kitchen ZINC-VITAMIN C PO Take 1 tablet by mouth every other day.     Allergies:   Lactose intolerance (gi)   Social History   Socioeconomic History  . Marital status: Married    Spouse name: [name]  . Number of children: 1  . Years of education: Not on file  . Highest education level: Not on file  Occupational History    Employer: [employer]  Social Needs  . Financial resource strain: Not on file  . Food insecurity:    Worry: Not on file    Inability: Not on file  . Transportation needs:    Medical: Not on file    Non-medical: Not on file  Tobacco Use  . Smoking status: Never Smoker  . Smokeless tobacco: Never Used  Substance and Sexual Activity  . Alcohol use: Yes    Comment: 02/13/12 "socially"  . Drug use: No  . Sexual activity: Not Currently  Lifestyle  . Physical activity:    Days per week: Not on file    Minutes per session: Not on file  . Stress: Not on file  Relationships  . Social connections:    Talks on phone: Not on file    Gets together: Not on file    Attends religious service: Not on file    Active member of club or organization: Not on file    Attends meetings of clubs or organizations: Not on file    Relationship status: Not on file  Other Topics Concern  . 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      Family History: The patient's family history includes Diabetes in his mother; Heart disease in his father and mother; Heart failure in his father and mother; Hypertension in his father and mother.  ROS:   Please see the  history of present illness.    Cough, muscle pain, rash, and leg swelling (which is gradually decreasing).  All other systems reviewed and are negative.  EKGs/Labs/Other Studies Reviewed:    The following studies were reviewed today: Laboratory data is obtained today. Creatinine at discharge from [facility] was in the 2.4 mg/dL range.  EKG:  EKG not repeated today.  Recent Labs: 07/22/2018: Hemoglobin 11.9; Platelets 263 08/15/2018: BUN 27; Creatinine, Ser 1.94; Potassium 4.1; Sodium 140 10/09/2018: TSH 3.020  Recent Lipid Panel    Component Value Date/Time   CHOL 163 12/27/2016 0848   TRIG 72 12/27/2016 0848   HDL 64 12/27/2016 0848   CHOLHDL 2.5 12/27/2016 0848   CHOLHDL 3.8 02/13/2012 1829   VLDL 30 02/13/2012 1829   LDLCALC 85 12/27/2016 0848    Physical Exam:    VS:  BP (!) 88/58   Pulse 80   Ht 5\' 9"  (1.753 m)   Wt 232 lb (105.2 kg)   SpO2 98%   BMI 34.26 kg/m     Wt Readings from Last 3 Encounters:  12/05/18 232 lb (105.2 kg)  12/05/18 232 lb (105.2 kg)  10/29/18 240 lb (108.9 kg)  Blood pressure sitting 110/70 mmHg; blood pressure standing 110/65 mmHg  GEN: Consistent with stated age in appearance. No acute distress HEENT: Normal NECK: No JVD. LYMPHATICS: No lymphadenopathy CARDIAC: IIRR.  No aortic regurgitation is heard.  A soft 1/6 systolic murmur at the right upper sternal border is audible.  No diastolic murmur, no gallop, trace to 1+ bilateral ankle and pedal edema VASCULAR: Irregular pulses, no bruits RESPIRATORY:  Clear to auscultation without rales, wheezing or rhonchi.  There is slight reduction in breath sounds in the left base. ABDOMEN: Soft, non-tender, non-distended, No pulsatile mass, MUSCULOSKELETAL: No  deformity  SKIN: Warm and dry NEUROLOGIC:  Alert and oriented x 3 PSYCHIATRIC:  Normal affect   ASSESSMENT:    1. Chronic systolic heart failure ([place])   2. Severe aortic regurgitation   3. Hypertension, accelerated   4. Paroxysmal atrial fibrillation (HCC)   5. CKD (chronic kidney disease), stage IV (HCC)    PLAN:    In order of problems listed above:  1. Known systolic heart failure with EF less than 35%.  He is now status post aortic root replacement, aortic valve replacement, and single-vessel coronary bypass grafting.  No cardiopulmonary complaints.  On exam appears to be mildly volume contracted in terms of neck veins and clinical pulmonary exam.  Low blood pressure also suggests volume contraction.  We will plan to hold this evening's furosemide.  Check a basic metabolic panel and BNP today. 2. Resolved status post aortic valve replacement with bioprosthetic valve. 3. Blood pressure is relatively low.  Will hold diuretic today.  May be that he cannot go back to 40 twice daily or 80 mg once per day.  Will make decision based upon laboratory data and shared results with Dr. Florinda Marker at [facility]. 4. Continuous atrial fibrillation is present. 5. Basic metabolic panel today will help [place] understand current volume status and kidney function.   Clinical follow-up with me in 3 to 4 weeks.   Medication Adjustments/Labs and Tests Ordered: Current medicines are reviewed at length with the patient today.  Concerns regarding medicines are outlined above.  Orders Placed This Encounter  Procedures  . Basic metabolic panel  . Pro b natriuretic peptide   No orders of the defined types were placed in this encounter.  Patient Instructions  Medication Instructions:  1) DO NOT take your Furosemide tonight  If you need a refill on your cardiac medications before your next appointment, please call your pharmacy.   Lab work: BMET and Pro BNP today  If you have labs (blood work) drawn  today and your tests are completely normal, you will receive your results only by: Marland Kitchen MyChart Message (if you have MyChart) OR . A paper copy in the mail If you have any lab test that is abnormal or we need to change your treatment, we will call you to review the results.  Testing/Procedures: None  Follow-Up: At [facility], you and your health needs are our priority.  As part of our continuing mission to provide you with exceptional heart care, we have created designated Provider Care Teams.  These Care Teams include your primary Cardiologist (physician) and Advanced Practice Providers (APPs -  Physician Assistants and Nurse Practitioners) who all work together to provide you with the care you need, when you need it. You will need a follow up appointment in 1 month (Can have 4/20 at 10:20A) .  Please call our office 2 months in advance to schedule this appointment.  You may see Dr. Tamala Julian or one of the following Advanced Practice Providers on your designated Care Team:   Truitt Merle, NP Cecilie Kicks, NP . Kathyrn Drown, NP  Any Other Special Instructions Will Be Listed Below (If Applicable).       Signed, Sinclair Grooms, MD  12/05/2018 4:30 PM    [place]

## 2018-12-06 ENCOUNTER — Telehealth: Payer: Self-pay | Admitting: *Deleted

## 2018-12-06 DIAGNOSIS — I5022 Chronic systolic (congestive) heart failure: Secondary | ICD-10-CM

## 2018-12-06 LAB — BASIC METABOLIC PANEL
BUN / CREAT RATIO: 12 (ref 10–24)
BUN: 33 mg/dL — AB (ref 8–27)
CO2: 18 mmol/L — ABNORMAL LOW (ref 20–29)
Calcium: 8.5 mg/dL — ABNORMAL LOW (ref 8.6–10.2)
Chloride: 107 mmol/L — ABNORMAL HIGH (ref 96–106)
Creatinine, Ser: 2.73 mg/dL — ABNORMAL HIGH (ref 0.76–1.27)
GFR calc Af Amer: 27 mL/min/{1.73_m2} — ABNORMAL LOW (ref >59)
GFR calc non Af Amer: 23 mL/min/{1.73_m2} — ABNORMAL LOW (ref >59)
Glucose: 81 mg/dL (ref 65–99)
Potassium: 4.7 mmol/L (ref 3.5–5.2)
Sodium: 139 mmol/L (ref 134–144)

## 2018-12-06 LAB — PRO B NATRIURETIC PEPTIDE: NT-Pro BNP: 4691 pg/mL — ABNORMAL HIGH (ref 0–376)

## 2018-12-06 MED ORDER — FUROSEMIDE 40 MG PO TABS: 40.0000 mg | ORAL_TABLET | Freq: Two times a day (BID) | ORAL | 3 refills | Status: DC

## 2018-12-06 NOTE — Telephone Encounter (Signed)
Spoke with pt and went over results and recommendations per Dr. Tamala Julian.  Pt states that he takes K+ 68meq TID.  Advised pt to cut this in half and take 29meq TID or 63meq BID.  Pt will plan to come here on 3/20 for labs and call to cancel if he sees Dr. Jimmye Norman prior to this and has labs repeated.  Pt verbalized understanding and was in agreement with this plan. Weight remains the same from yesterday.  Advised to call if he sees weight creeping up since we are lowering Furosemide dose.

## 2018-12-06 NOTE — Telephone Encounter (Signed)
-----   Message from Belva Crome, MD sent at 12/06/2018 12:48 PM EDT ----- Let the patient know the creatinine is 2.73, potassium 4.7 and bicarbonate is 18.  I would recommend decreasing furosemide to 40 mg twice daily.  Recheck basic metabolic panel in 5 to 7 days.  That could either be done here or with Dr. Florinda Marker.  I cannot tell how much potassium he is taking but since we are decreasing the furosemide intensity, the potassium dose needs to decrease as well by approximately 30-50 %.  If he is on 20 mEq/day decreased to 10 mEq.  If he is on 40 mEq, decreased to 20 mEq.  Lab results to Dr. Florinda Marker at [facility] (Fax) A copy will be sent to Glendale Chard, MD

## 2018-12-09 NOTE — Addendum Note (Signed)
Addended by: Rose Phi on: 12/09/2018 07:37 AM   Modules accepted: Orders

## 2018-12-10 LAB — CUP PACEART INCLINIC DEVICE CHECK
Battery Remaining Longevity: 51 mo
Brady Statistic RA Percent Paced: 0.06 %
Brady Statistic RV Percent Paced: 99.96 %
Date Time Interrogation Session: 20200312174013
HighPow Impedance: 64.125
Implantable Lead Implant Date: 20170525
Implantable Lead Implant Date: 20170525
Implantable Lead Implant Date: 20170525
Implantable Lead Location: 753859
Implantable Lead Model: 7122
Implantable Pulse Generator Implant Date: 20170525
Lead Channel Impedance Value: 375 Ohm
Lead Channel Impedance Value: 412.5 Ohm
Lead Channel Impedance Value: 575 Ohm
Lead Channel Pacing Threshold Amplitude: 0.5 V
Lead Channel Pacing Threshold Amplitude: 1.25 V
Lead Channel Pacing Threshold Pulse Width: 0.5 ms
Lead Channel Pacing Threshold Pulse Width: 0.5 ms
Lead Channel Pacing Threshold Pulse Width: 0.5 ms
Lead Channel Sensing Intrinsic Amplitude: 0.3 mV
Lead Channel Sensing Intrinsic Amplitude: 11.4 mV
Lead Channel Setting Pacing Amplitude: 2 V
Lead Channel Setting Pacing Amplitude: 2.5 V
Lead Channel Setting Pacing Pulse Width: 0.5 ms
Lead Channel Setting Pacing Pulse Width: 0.5 ms
MDC IDC LEAD LOCATION: 753858
MDC IDC LEAD LOCATION: 753860
MDC IDC MSMT LEADCHNL LV PACING THRESHOLD AMPLITUDE: 1.25 V
MDC IDC MSMT LEADCHNL LV PACING THRESHOLD PULSEWIDTH: 0.5 ms
MDC IDC MSMT LEADCHNL RV PACING THRESHOLD AMPLITUDE: 0.5 V
MDC IDC SET LEADCHNL RA PACING AMPLITUDE: 2 V
MDC IDC SET LEADCHNL RV SENSING SENSITIVITY: 0.5 mV
Pulse Gen Serial Number: 7357926

## 2018-12-13 ENCOUNTER — Other Ambulatory Visit: Payer: Self-pay

## 2018-12-13 ENCOUNTER — Other Ambulatory Visit: Payer: Medicare Other | Admitting: *Deleted

## 2018-12-13 DIAGNOSIS — I5022 Chronic systolic (congestive) heart failure: Secondary | ICD-10-CM | POA: Diagnosis not present

## 2018-12-14 LAB — BASIC METABOLIC PANEL
BUN/Creatinine Ratio: 15 (ref 10–24)
BUN: 40 mg/dL — AB (ref 8–27)
CO2: 17 mmol/L — ABNORMAL LOW (ref 20–29)
Calcium: 9.1 mg/dL (ref 8.6–10.2)
Chloride: 103 mmol/L (ref 96–106)
Creatinine, Ser: 2.75 mg/dL — ABNORMAL HIGH (ref 0.76–1.27)
GFR calc Af Amer: 26 mL/min/{1.73_m2} — ABNORMAL LOW (ref >59)
GFR calc non Af Amer: 23 mL/min/{1.73_m2} — ABNORMAL LOW (ref >59)
Glucose: 116 mg/dL — ABNORMAL HIGH (ref 65–99)
Potassium: 4.9 mmol/L (ref 3.5–5.2)
Sodium: 133 mmol/L — ABNORMAL LOW (ref 134–144)

## 2018-12-16 ENCOUNTER — Telehealth (HOSPITAL_COMMUNITY): Payer: Self-pay | Admitting: *Deleted

## 2018-12-16 NOTE — Telephone Encounter (Signed)
[name] returned my call.  Advised pt that we do have a referral for him to participate in Cardiac Rehab from Dr. Jimmye Norman.  Pt noted that he also sees Dr. Lovena Le and Dr. Tamala Julian.  I had reviewed their office notes from 12/05/18.  Pt advised that for right now we our department is closed for at least the next 4 weeks for patients due to Covid-19.  Once we are able to resume scheduling, we will contact him.  Pt will see the heart surgeon on tomorrow at [facility]. Advise him to ask for exercise guidelines that he may do in the interim.  Will have support staff verify insurance, create referral. and place his paperwork in the appt bin for 12/17/18 for review. Pt verbalized understanding. Cherre Huger, BSN Cardiac and Training and development officer

## 2018-12-16 NOTE — Telephone Encounter (Signed)
Called and left message for pt regarding Cardiac Rehab referral. Contact information provided. Carlette Carlton RN, BSN Cardiac and Pulmonary Rehab Nurse Navigator    

## 2018-12-17 ENCOUNTER — Telehealth (HOSPITAL_COMMUNITY): Payer: Self-pay

## 2018-12-17 DIAGNOSIS — I480 Paroxysmal atrial fibrillation: Secondary | ICD-10-CM | POA: Diagnosis not present

## 2018-12-17 DIAGNOSIS — J90 Pleural effusion, not elsewhere classified: Secondary | ICD-10-CM | POA: Diagnosis not present

## 2018-12-17 DIAGNOSIS — I447 Left bundle-branch block, unspecified: Secondary | ICD-10-CM | POA: Diagnosis not present

## 2018-12-17 DIAGNOSIS — Z7901 Long term (current) use of anticoagulants: Secondary | ICD-10-CM | POA: Diagnosis not present

## 2018-12-17 DIAGNOSIS — Z8679 Personal history of other diseases of the circulatory system: Secondary | ICD-10-CM | POA: Diagnosis not present

## 2018-12-17 DIAGNOSIS — Z952 Presence of prosthetic heart valve: Secondary | ICD-10-CM | POA: Diagnosis not present

## 2018-12-17 DIAGNOSIS — Z9889 Other specified postprocedural states: Secondary | ICD-10-CM | POA: Diagnosis not present

## 2018-12-17 DIAGNOSIS — R600 Localized edema: Secondary | ICD-10-CM | POA: Diagnosis not present

## 2018-12-17 DIAGNOSIS — Z95810 Presence of automatic (implantable) cardiac defibrillator: Secondary | ICD-10-CM | POA: Diagnosis not present

## 2018-12-17 DIAGNOSIS — I493 Ventricular premature depolarization: Secondary | ICD-10-CM | POA: Diagnosis not present

## 2018-12-17 DIAGNOSIS — Z48812 Encounter for surgical aftercare following surgery on the circulatory system: Secondary | ICD-10-CM | POA: Diagnosis not present

## 2018-12-17 DIAGNOSIS — Z7982 Long term (current) use of aspirin: Secondary | ICD-10-CM | POA: Diagnosis not present

## 2018-12-17 NOTE — Telephone Encounter (Signed)
Pt insurance is active and benefits verified through Medicare A/B. Co-pay $0.00, DED $198.00/$198.00 met, out of pocket $0.00/$0.00 met, co-insurance 20%. No pre-authorization required. Passport, 12/17/2018 @ 10:17AM, REF# [id number]  2ndary insurance is active and benefits verified through [place]. Co-pay $0.00, DED $0.00/$0.00 met, out of pocket $0.00/$0.00 met, co-insurance 0%. No pre-authorization required. Passport, 12/17/2018 @ 10:20AM, REF# [id number]

## 2018-12-17 NOTE — Telephone Encounter (Signed)
Called and spoke with pt in regards to insurance benefits for CR, went over insurance, patient verbalized understanding. Will contact patient for scheduling once f/u has been completed.

## 2018-12-19 NOTE — Telephone Encounter (Signed)
Called and spoke with pt in regards to CR, adv pt our department is closed due to the COVID-19. And we will contact him once we resume scheduling. Pt verbalized understanding.

## 2018-12-24 ENCOUNTER — Telehealth: Payer: Self-pay | Admitting: Interventional Cardiology

## 2018-12-24 DIAGNOSIS — I130 Hypertensive heart and chronic kidney disease with heart failure and stage 1 through stage 4 chronic kidney disease, or unspecified chronic kidney disease: Secondary | ICD-10-CM | POA: Diagnosis present

## 2018-12-24 DIAGNOSIS — L089 Local infection of the skin and subcutaneous tissue, unspecified: Secondary | ICD-10-CM | POA: Diagnosis not present

## 2018-12-24 DIAGNOSIS — N184 Chronic kidney disease, stage 4 (severe): Secondary | ICD-10-CM | POA: Diagnosis not present

## 2018-12-24 DIAGNOSIS — S21101A Unspecified open wound of right front wall of thorax without penetration into thoracic cavity, initial encounter: Secondary | ICD-10-CM | POA: Diagnosis not present

## 2018-12-24 DIAGNOSIS — D62 Acute posthemorrhagic anemia: Secondary | ICD-10-CM | POA: Diagnosis not present

## 2018-12-24 DIAGNOSIS — I10 Essential (primary) hypertension: Secondary | ICD-10-CM | POA: Diagnosis not present

## 2018-12-24 DIAGNOSIS — Z8674 Personal history of sudden cardiac arrest: Secondary | ICD-10-CM | POA: Diagnosis not present

## 2018-12-24 DIAGNOSIS — E039 Hypothyroidism, unspecified: Secondary | ICD-10-CM | POA: Diagnosis present

## 2018-12-24 DIAGNOSIS — B961 Klebsiella pneumoniae [K. pneumoniae] as the cause of diseases classified elsewhere: Secondary | ICD-10-CM | POA: Diagnosis present

## 2018-12-24 DIAGNOSIS — T8149XA Infection following a procedure, other surgical site, initial encounter: Secondary | ICD-10-CM | POA: Diagnosis not present

## 2018-12-24 DIAGNOSIS — T8132XA Disruption of internal operation (surgical) wound, not elsewhere classified, initial encounter: Secondary | ICD-10-CM | POA: Diagnosis not present

## 2018-12-24 DIAGNOSIS — Z952 Presence of prosthetic heart valve: Secondary | ICD-10-CM | POA: Diagnosis not present

## 2018-12-24 DIAGNOSIS — I129 Hypertensive chronic kidney disease with stage 1 through stage 4 chronic kidney disease, or unspecified chronic kidney disease: Secondary | ICD-10-CM | POA: Diagnosis not present

## 2018-12-24 DIAGNOSIS — Z79899 Other long term (current) drug therapy: Secondary | ICD-10-CM | POA: Diagnosis not present

## 2018-12-24 DIAGNOSIS — Y33XXXA Other specified events, undetermined intent, initial encounter: Secondary | ICD-10-CM | POA: Diagnosis not present

## 2018-12-24 DIAGNOSIS — I480 Paroxysmal atrial fibrillation: Secondary | ICD-10-CM | POA: Diagnosis present

## 2018-12-24 DIAGNOSIS — Z95810 Presence of automatic (implantable) cardiac defibrillator: Secondary | ICD-10-CM | POA: Diagnosis not present

## 2018-12-24 DIAGNOSIS — Z452 Encounter for adjustment and management of vascular access device: Secondary | ICD-10-CM | POA: Diagnosis not present

## 2018-12-24 DIAGNOSIS — J9811 Atelectasis: Secondary | ICD-10-CM | POA: Diagnosis not present

## 2018-12-24 DIAGNOSIS — J811 Chronic pulmonary edema: Secondary | ICD-10-CM | POA: Diagnosis not present

## 2018-12-24 DIAGNOSIS — A498 Other bacterial infections of unspecified site: Secondary | ICD-10-CM | POA: Diagnosis not present

## 2018-12-24 DIAGNOSIS — I428 Other cardiomyopathies: Secondary | ICD-10-CM | POA: Diagnosis present

## 2018-12-24 DIAGNOSIS — E669 Obesity, unspecified: Secondary | ICD-10-CM | POA: Diagnosis present

## 2018-12-24 DIAGNOSIS — E1122 Type 2 diabetes mellitus with diabetic chronic kidney disease: Secondary | ICD-10-CM | POA: Diagnosis not present

## 2018-12-24 DIAGNOSIS — J90 Pleural effusion, not elsewhere classified: Secondary | ICD-10-CM | POA: Diagnosis not present

## 2018-12-24 DIAGNOSIS — R918 Other nonspecific abnormal finding of lung field: Secondary | ICD-10-CM | POA: Diagnosis not present

## 2018-12-24 DIAGNOSIS — Z1159 Encounter for screening for other viral diseases: Secondary | ICD-10-CM | POA: Diagnosis not present

## 2018-12-24 DIAGNOSIS — I5023 Acute on chronic systolic (congestive) heart failure: Secondary | ICD-10-CM | POA: Diagnosis not present

## 2018-12-24 DIAGNOSIS — I447 Left bundle-branch block, unspecified: Secondary | ICD-10-CM | POA: Diagnosis present

## 2018-12-24 DIAGNOSIS — I459 Conduction disorder, unspecified: Secondary | ICD-10-CM | POA: Diagnosis present

## 2018-12-24 DIAGNOSIS — Z8679 Personal history of other diseases of the circulatory system: Secondary | ICD-10-CM | POA: Diagnosis not present

## 2018-12-24 DIAGNOSIS — I5022 Chronic systolic (congestive) heart failure: Secondary | ICD-10-CM | POA: Diagnosis not present

## 2018-12-24 DIAGNOSIS — E785 Hyperlipidemia, unspecified: Secondary | ICD-10-CM | POA: Diagnosis present

## 2018-12-24 DIAGNOSIS — Z95828 Presence of other vascular implants and grafts: Secondary | ICD-10-CM | POA: Diagnosis not present

## 2018-12-24 DIAGNOSIS — T8141XA Infection following a procedure, superficial incisional surgical site, initial encounter: Secondary | ICD-10-CM | POA: Diagnosis not present

## 2018-12-24 DIAGNOSIS — Z7901 Long term (current) use of anticoagulants: Secondary | ICD-10-CM | POA: Diagnosis not present

## 2018-12-24 DIAGNOSIS — T8131XA Disruption of external operation (surgical) wound, not elsewhere classified, initial encounter: Secondary | ICD-10-CM | POA: Diagnosis present

## 2018-12-24 DIAGNOSIS — N189 Chronic kidney disease, unspecified: Secondary | ICD-10-CM | POA: Diagnosis not present

## 2018-12-24 DIAGNOSIS — B9689 Other specified bacterial agents as the cause of diseases classified elsewhere: Secondary | ICD-10-CM | POA: Diagnosis not present

## 2018-12-24 DIAGNOSIS — I2510 Atherosclerotic heart disease of native coronary artery without angina pectoris: Secondary | ICD-10-CM | POA: Diagnosis present

## 2018-12-24 DIAGNOSIS — Z9889 Other specified postprocedural states: Secondary | ICD-10-CM | POA: Diagnosis not present

## 2018-12-24 DIAGNOSIS — B957 Other staphylococcus as the cause of diseases classified elsewhere: Secondary | ICD-10-CM | POA: Diagnosis not present

## 2018-12-24 DIAGNOSIS — G8918 Other acute postprocedural pain: Secondary | ICD-10-CM | POA: Diagnosis not present

## 2018-12-24 DIAGNOSIS — I517 Cardiomegaly: Secondary | ICD-10-CM | POA: Diagnosis not present

## 2018-12-24 DIAGNOSIS — T8142XA Infection following a procedure, deep incisional surgical site, initial encounter: Secondary | ICD-10-CM | POA: Diagnosis not present

## 2018-12-24 DIAGNOSIS — Z7982 Long term (current) use of aspirin: Secondary | ICD-10-CM | POA: Diagnosis not present

## 2018-12-24 DIAGNOSIS — Z953 Presence of xenogenic heart valve: Secondary | ICD-10-CM | POA: Diagnosis not present

## 2018-12-24 NOTE — Telephone Encounter (Signed)
Eric Crome, MD  Loren Racer, LPN        I have instructed [name] to change furosemide dosing to 80 mg alternated with 40 mg every day.  Potassium dose should stay the same.  Needs basic metabolic panel in 2 weeks.  Weigh daily and notify us if it begins to increase or if lower extremity swelling resumes.  Diuretic intensity is being decreased because of a slight bump in creatinine up to 2.9 from prior of 2.4.     Spoke with pt and scheduled him to come for labs on 4/14. Pt aware to come in the morning.

## 2018-12-27 ENCOUNTER — Telehealth: Payer: Self-pay

## 2018-12-27 DIAGNOSIS — Z7409 Other reduced mobility: Secondary | ICD-10-CM

## 2018-12-27 DIAGNOSIS — R531 Weakness: Secondary | ICD-10-CM | POA: Insufficient documentation

## 2018-12-27 DIAGNOSIS — R931 Abnormal findings on diagnostic imaging of heart and coronary circulation: Secondary | ICD-10-CM | POA: Insufficient documentation

## 2018-12-27 DIAGNOSIS — Z789 Other specified health status: Secondary | ICD-10-CM | POA: Insufficient documentation

## 2018-12-27 DIAGNOSIS — I10 Essential (primary) hypertension: Secondary | ICD-10-CM | POA: Insufficient documentation

## 2018-12-27 DIAGNOSIS — A498 Other bacterial infections of unspecified site: Secondary | ICD-10-CM | POA: Insufficient documentation

## 2018-12-27 DIAGNOSIS — R9430 Abnormal result of cardiovascular function study, unspecified: Secondary | ICD-10-CM | POA: Insufficient documentation

## 2018-12-27 DIAGNOSIS — T8149XA Infection following a procedure, other surgical site, initial encounter: Secondary | ICD-10-CM | POA: Insufficient documentation

## 2018-12-27 NOTE — Telephone Encounter (Signed)
I called the pt to do an evisit for a hospital f/u and the pt said that he is back in the hospital because his incision area got infected.  The pt said that the hospital did an incision and drainage on 12/25/2018.

## 2019-01-03 DIAGNOSIS — Z792 Long term (current) use of antibiotics: Secondary | ICD-10-CM | POA: Insufficient documentation

## 2019-01-06 ENCOUNTER — Other Ambulatory Visit: Payer: Self-pay

## 2019-01-06 ENCOUNTER — Encounter: Payer: Medicare Other | Admitting: *Deleted

## 2019-01-07 ENCOUNTER — Telehealth: Payer: Self-pay | Admitting: Interventional Cardiology

## 2019-01-07 ENCOUNTER — Other Ambulatory Visit: Payer: Medicare Other

## 2019-01-07 ENCOUNTER — Telehealth: Payer: Self-pay | Admitting: Internal Medicine

## 2019-01-07 ENCOUNTER — Telehealth: Payer: Self-pay

## 2019-01-07 DIAGNOSIS — Z9889 Other specified postprocedural states: Secondary | ICD-10-CM | POA: Insufficient documentation

## 2019-01-07 DIAGNOSIS — I2510 Atherosclerotic heart disease of native coronary artery without angina pectoris: Secondary | ICD-10-CM | POA: Insufficient documentation

## 2019-01-07 NOTE — Telephone Encounter (Signed)
Left message to call back  

## 2019-01-07 NOTE — Telephone Encounter (Signed)
Follow up:   Patient wife sating that  Some called her concering her husband appt.please patient wife.

## 2019-01-07 NOTE — Telephone Encounter (Signed)
New Message  Dr. Florinda Marker called for Dr. Tamala Julian to follow up on the patient.  His cell phone number is provided, and he would like Dr. Tamala Julian to give him a call.

## 2019-01-07 NOTE — Telephone Encounter (Signed)
Unable to leave a message for patient to remind of missed remote transmission.  

## 2019-01-07 NOTE — Telephone Encounter (Signed)
Left message to call back.  Needs to be changed to a virtual video visit if able at 1130 on 4/20

## 2019-01-08 ENCOUNTER — Ambulatory Visit: Payer: Medicare Other | Admitting: Internal Medicine

## 2019-01-08 NOTE — Telephone Encounter (Signed)
Spoke to Dr. Jimmye Norman.

## 2019-01-09 ENCOUNTER — Telehealth: Payer: Self-pay

## 2019-01-09 NOTE — Telephone Encounter (Signed)
Dr. Tamala Julian called Dr. Jimmye Norman.  Spoke with pt's wife and scheduled pt for virtual visit on 4/24 with Dr. Tamala Julian.

## 2019-01-09 NOTE — Telephone Encounter (Signed)
I tried to call the pt and let him know that Dr. Baird Cancer placed samples of Bidil up front for pickup but his voicemail is full.  I sent a message in his MyChart account.

## 2019-01-09 NOTE — Telephone Encounter (Signed)
Spoke with pt's wife and scheduled pt for virtual visit on 4/24 with Dr. Tamala Julian.

## 2019-01-09 NOTE — Telephone Encounter (Signed)
F/U Message           Patient's wife is calling for a call back from [place], [place] call to advise.

## 2019-01-10 ENCOUNTER — Ambulatory Visit: Payer: Medicare Other | Admitting: Podiatry

## 2019-01-11 MED ORDER — SENNOSIDES-DOCUSATE SODIUM 8.6-50 MG PO TABS
2.00 | ORAL_TABLET | ORAL | Status: DC
Start: 2019-01-11 — End: 2019-01-11

## 2019-01-11 MED ORDER — PANTOPRAZOLE SODIUM 40 MG PO TBEC
40.00 | DELAYED_RELEASE_TABLET | ORAL | Status: DC
Start: 2019-01-11 — End: 2019-01-11

## 2019-01-11 MED ORDER — GENERIC EXTERNAL MEDICATION
500.00 | Status: DC
Start: 2019-01-11 — End: 2019-01-11

## 2019-01-11 MED ORDER — GENERIC EXTERNAL MEDICATION
100.00 | Status: DC
Start: 2019-01-11 — End: 2019-01-11

## 2019-01-11 MED ORDER — FUROSEMIDE 40 MG PO TABS
40.00 | ORAL_TABLET | ORAL | Status: DC
Start: 2019-01-11 — End: 2019-01-11

## 2019-01-11 MED ORDER — POLYETHYLENE GLYCOL 3350 17 G PO PACK
17.00 | PACK | ORAL | Status: DC
Start: 2019-01-11 — End: 2019-01-11

## 2019-01-11 MED ORDER — GENERIC EXTERNAL MEDICATION
2.00 | Status: DC
Start: 2019-01-11 — End: 2019-01-11

## 2019-01-11 MED ORDER — APIXABAN 5 MG PO TABS
5.00 | ORAL_TABLET | ORAL | Status: DC
Start: 2019-01-10 — End: 2019-01-11

## 2019-01-11 MED ORDER — ASPIRIN 81 MG PO CHEW
81.00 | CHEWABLE_TABLET | ORAL | Status: DC
Start: 2019-01-11 — End: 2019-01-11

## 2019-01-11 MED ORDER — AMIODARONE HCL 200 MG PO TABS
200.00 | ORAL_TABLET | ORAL | Status: DC
Start: 2019-01-11 — End: 2019-01-11

## 2019-01-11 MED ORDER — FLUTICASONE PROPIONATE 50 MCG/ACT NA SUSP
1.00 | NASAL | Status: DC
Start: 2019-01-11 — End: 2019-01-11

## 2019-01-11 MED ORDER — LEVOTHYROXINE SODIUM 75 MCG PO TABS
75.00 | ORAL_TABLET | ORAL | Status: DC
Start: 2019-01-11 — End: 2019-01-11

## 2019-01-11 MED ORDER — HYDROXYZINE PAMOATE 25 MG PO CAPS
25.00 | ORAL_CAPSULE | ORAL | Status: DC
Start: ? — End: 2019-01-11

## 2019-01-11 MED ORDER — LIDOCAINE 5 % EX PTCH
2.00 | MEDICATED_PATCH | CUTANEOUS | Status: DC
Start: 2019-01-10 — End: 2019-01-11

## 2019-01-11 MED ORDER — ROSUVASTATIN CALCIUM 10 MG PO TABS
20.00 | ORAL_TABLET | ORAL | Status: DC
Start: 2019-01-10 — End: 2019-01-11

## 2019-01-11 MED ORDER — POTASSIUM CHLORIDE ER 20 MEQ PO TBCR
20.00 | EXTENDED_RELEASE_TABLET | ORAL | Status: DC
Start: 2019-01-11 — End: 2019-01-11

## 2019-01-11 MED ORDER — ACETAMINOPHEN 325 MG PO TABS
975.00 | ORAL_TABLET | ORAL | Status: DC
Start: ? — End: 2019-01-11

## 2019-01-11 MED ORDER — OXYCODONE HCL 5 MG PO TABS
5.00 | ORAL_TABLET | ORAL | Status: DC
Start: ? — End: 2019-01-11

## 2019-01-11 MED ORDER — CARVEDILOL 3.125 MG PO TABS
3.13 | ORAL_TABLET | ORAL | Status: DC
Start: 2019-01-10 — End: 2019-01-11

## 2019-01-11 MED ORDER — BISACODYL 10 MG RE SUPP
10.00 | RECTAL | Status: DC
Start: ? — End: 2019-01-11

## 2019-01-11 MED ORDER — FERROUS FUMARATE 324 (106 FE) MG PO TABS
324.00 | ORAL_TABLET | ORAL | Status: DC
Start: 2019-01-11 — End: 2019-01-11

## 2019-01-12 DIAGNOSIS — I428 Other cardiomyopathies: Secondary | ICD-10-CM | POA: Diagnosis not present

## 2019-01-12 DIAGNOSIS — I447 Left bundle-branch block, unspecified: Secondary | ICD-10-CM | POA: Diagnosis not present

## 2019-01-12 DIAGNOSIS — B957 Other staphylococcus as the cause of diseases classified elsewhere: Secondary | ICD-10-CM | POA: Diagnosis not present

## 2019-01-12 DIAGNOSIS — Z792 Long term (current) use of antibiotics: Secondary | ICD-10-CM | POA: Diagnosis not present

## 2019-01-12 DIAGNOSIS — I130 Hypertensive heart and chronic kidney disease with heart failure and stage 1 through stage 4 chronic kidney disease, or unspecified chronic kidney disease: Secondary | ICD-10-CM | POA: Diagnosis not present

## 2019-01-12 DIAGNOSIS — E1122 Type 2 diabetes mellitus with diabetic chronic kidney disease: Secondary | ICD-10-CM | POA: Diagnosis not present

## 2019-01-12 DIAGNOSIS — I480 Paroxysmal atrial fibrillation: Secondary | ICD-10-CM | POA: Diagnosis not present

## 2019-01-12 DIAGNOSIS — E1140 Type 2 diabetes mellitus with diabetic neuropathy, unspecified: Secondary | ICD-10-CM | POA: Diagnosis not present

## 2019-01-12 DIAGNOSIS — Z452 Encounter for adjustment and management of vascular access device: Secondary | ICD-10-CM | POA: Diagnosis not present

## 2019-01-12 DIAGNOSIS — Z95810 Presence of automatic (implantable) cardiac defibrillator: Secondary | ICD-10-CM | POA: Diagnosis not present

## 2019-01-12 DIAGNOSIS — T8141XA Infection following a procedure, superficial incisional surgical site, initial encounter: Secondary | ICD-10-CM | POA: Diagnosis not present

## 2019-01-12 DIAGNOSIS — N184 Chronic kidney disease, stage 4 (severe): Secondary | ICD-10-CM | POA: Diagnosis not present

## 2019-01-12 DIAGNOSIS — Z8701 Personal history of pneumonia (recurrent): Secondary | ICD-10-CM | POA: Diagnosis not present

## 2019-01-12 DIAGNOSIS — B961 Klebsiella pneumoniae [K. pneumoniae] as the cause of diseases classified elsewhere: Secondary | ICD-10-CM | POA: Diagnosis not present

## 2019-01-12 DIAGNOSIS — I4892 Unspecified atrial flutter: Secondary | ICD-10-CM | POA: Diagnosis not present

## 2019-01-12 DIAGNOSIS — T8131XA Disruption of external operation (surgical) wound, not elsewhere classified, initial encounter: Secondary | ICD-10-CM | POA: Diagnosis not present

## 2019-01-12 DIAGNOSIS — Z7901 Long term (current) use of anticoagulants: Secondary | ICD-10-CM | POA: Diagnosis not present

## 2019-01-12 DIAGNOSIS — Z9181 History of falling: Secondary | ICD-10-CM | POA: Diagnosis not present

## 2019-01-12 DIAGNOSIS — E039 Hypothyroidism, unspecified: Secondary | ICD-10-CM | POA: Diagnosis not present

## 2019-01-12 DIAGNOSIS — I5022 Chronic systolic (congestive) heart failure: Secondary | ICD-10-CM | POA: Diagnosis not present

## 2019-01-12 DIAGNOSIS — D631 Anemia in chronic kidney disease: Secondary | ICD-10-CM | POA: Diagnosis not present

## 2019-01-13 ENCOUNTER — Ambulatory Visit: Payer: Medicare Other | Admitting: Interventional Cardiology

## 2019-01-14 ENCOUNTER — Telehealth (HOSPITAL_COMMUNITY): Payer: Self-pay | Admitting: *Deleted

## 2019-01-14 DIAGNOSIS — B961 Klebsiella pneumoniae [K. pneumoniae] as the cause of diseases classified elsewhere: Secondary | ICD-10-CM | POA: Diagnosis not present

## 2019-01-14 DIAGNOSIS — B957 Other staphylococcus as the cause of diseases classified elsewhere: Secondary | ICD-10-CM | POA: Diagnosis not present

## 2019-01-14 DIAGNOSIS — T8131XA Disruption of external operation (surgical) wound, not elsewhere classified, initial encounter: Secondary | ICD-10-CM | POA: Diagnosis not present

## 2019-01-14 DIAGNOSIS — I130 Hypertensive heart and chronic kidney disease with heart failure and stage 1 through stage 4 chronic kidney disease, or unspecified chronic kidney disease: Secondary | ICD-10-CM | POA: Diagnosis not present

## 2019-01-14 DIAGNOSIS — T8141XA Infection following a procedure, superficial incisional surgical site, initial encounter: Secondary | ICD-10-CM | POA: Diagnosis not present

## 2019-01-14 DIAGNOSIS — I5022 Chronic systolic (congestive) heart failure: Secondary | ICD-10-CM | POA: Diagnosis not present

## 2019-01-14 NOTE — Telephone Encounter (Signed)
Unable to leave message on voicemail - full.  Pt hospitalized at [facility] on 3/31-4/17 with sternal wound infection.  Pt had a muscle flap completed on 01/06/19. Pt will continue with home IV infusion for Vancomycin till 5/25  Pt will need to complete and be cleared by MD in order to proceed with scheduling CR once we are permitted to schedule patients. Will hold on follow up phone call until post hospital visits have been completed.  Pt has the following upcoming appt. [place]) 4/23; [place] ([place]) 5/3; Chu([place]) 5/7; Smith([place])4/24 and Taylor([place]) 5/19 Continue to follow along for readiness to proceed when permitted scheduling. Cherre Huger, BSN Cardiac and Training and development officer

## 2019-01-15 ENCOUNTER — Ambulatory Visit: Payer: Self-pay

## 2019-01-15 DIAGNOSIS — N183 Chronic kidney disease, stage 3 unspecified: Secondary | ICD-10-CM

## 2019-01-15 DIAGNOSIS — E7800 Pure hypercholesterolemia, unspecified: Secondary | ICD-10-CM

## 2019-01-15 DIAGNOSIS — I130 Hypertensive heart and chronic kidney disease with heart failure and stage 1 through stage 4 chronic kidney disease, or unspecified chronic kidney disease: Secondary | ICD-10-CM

## 2019-01-15 DIAGNOSIS — E1122 Type 2 diabetes mellitus with diabetic chronic kidney disease: Secondary | ICD-10-CM

## 2019-01-15 DIAGNOSIS — I5022 Chronic systolic (congestive) heart failure: Secondary | ICD-10-CM

## 2019-01-15 NOTE — Chronic Care Management (AMB) (Signed)
  Chronic Care Management   Telephone Outreach Note  01/15/2019 Name: Tadarius Maland., MD MRN: [id number] DOB: [birth year]  Referred by: patient's health plan.   I reached out to Mr. [name]., MD today by phone in response to a referral sent by Mr. [name]., MD's health plan. Unfortunately today's call was unsuccessful. The patients voice mailbox was full prohibiting SW from leaving a voice message.  The CM team will reach out to the patient again over the next 7 days.    Glendale Chard, MD has been notified of this outreach and plan.   Daneen Schick, BSW, CDP [facility] Care Management Social Worker [phone]  Total time spent performing care coordination and/or care management activities with the patient by phone or face to face = 5 minutes.

## 2019-01-16 ENCOUNTER — Encounter: Payer: Self-pay | Admitting: Cardiology

## 2019-01-16 DIAGNOSIS — Z9889 Other specified postprocedural states: Secondary | ICD-10-CM | POA: Diagnosis not present

## 2019-01-16 NOTE — Progress Notes (Signed)
Virtual Visit via Video Note   This visit type was conducted due to national recommendations for restrictions regarding the COVID-19 Pandemic (e.g. social distancing) in an effort to limit this patient's exposure and mitigate transmission in our community.  Due to his co-morbid illnesses, this patient is at least at moderate risk for complications without adequate follow up.  This format is felt to be most appropriate for this patient at this time.  All issues noted in this document were discussed and addressed.  A limited physical exam was performed with this format.  Please refer to the patient's chart for his consent to telehealth for [facility].   Evaluation Performed:  Follow-up visit  Date:  01/17/2019   ID:  Eric [name]., Eric [name], DOB June 24, 1951, MRN [id number]  Patient Location: Home Provider Location: Office  PCP:  Glendale Chard, Eric [name]  Cardiologist:  No primary care provider on file.  Electrophysiologist:  None   Chief Complaint:  CHF/AVR/Aoroot replacement/Sternal infection requiring flap.  History of Present Illness:    Eric [name]., Eric [name] is a 68 y.o. male with  a hx of severe aortic regurgitation, valve induced left ventricular systolic dysfunction/nonischemic cardiomyopathy, CKD stage IV, essential hypertension, type 2 diabetes, prior history of cardiac arrest due to ventricular fibrillation, CAD without angina with known significant diagonal stenosis, and November 20, 2018 underwent aortic valve replacement and aortic root replacement using bioprosthetic valve / aortic graft and single-vessel coronary grafting with saphenous vein. Subsequent sternal wound infection requiring sternal flap and prolonged antibiotics.  [name] is back at home.  He has been at home greater than 1 week.  He has not had dyspnea or any significant lower extremity swelling.  I reminded him to record his weights.  He is currently on furosemide 40 mg daily.  He denies chills and fever.  He has an  indwelling central line.  He was seen at [facility] yesterday and the closed sternal wound appears to be progressing nicely.  His appetite is stable.  He states blood work is being drawn once per week but he is unsure of what is being tested.  Nothing is available in care Everywhere.  The patient does not have symptoms concerning for COVID-19 infection (fever, chills, cough, or new shortness of breath).    Past Medical History:  Diagnosis Date  . Acute blood loss anemia   . Acute encephalopathy   . Acute hypoxemic respiratory failure ([place])   . Acute idiopathic gout of right ankle   . Acute lumbar back pain   . Acute on chronic systolic heart failure ([place])   . Acute respiratory failure ([place])   . AKI (acute kidney injury) ([place])   . Altered mental status   . Aortic aneurysm without rupture ([place]) 02/09/2016  . Aortic root enlargement ([place]) 02/13/2012  . Aortic valve regurgitation, acquired 02/13/2012  . Arrhythmia   . Atrial flutter ([place]) 03/18/2012  . Back pain   . Cardiac arrest ([place]) 02/01/2016  . CHF (congestive heart failure) ([place])   . Chronic anticoagulation 03/04/2013  . Chronic kidney disease    kidney fx studies increased   . Chronic lower back pain   . Chronic renal insufficiency   . Chronic systolic heart failure ([place]) 02/13/2012   Recent diagnosis 4 / 2013, LVEF 25% by Echo 12/2011  03/2013: Echo at [facility] Cardiology Conclusions: 1. Left ventricular ejection fraction estimated by 2D at 40-45 percent. 2. Mild concentric left ventricular hypertrophy. 3. Mild left atrial enlargement. 4. Moderate aortic valve  regurgitation. 5. The aortic root at the sinus(es) of valsalva is moderately dilated 6. Mild mitral valve regurgitation. 7.  . CKD (chronic kidney disease)   . CKD (chronic kidney disease), stage IV ([place]) 08/06/2013   Creatinine 2.4 on 07/04/13   . Claustrophobia   . Colon cancer screening 03/04/2013  . Debility 02/22/2016  . Diabetes mellitus type 2 in nonobese (HCC)   . Diabetes  mellitus type 2 in obese ([place])   . Dysrhythmia    "palpitations"  . Encounter for central line placement   . Exertional dyspnea 01/2012  . 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 HCAP (healthcare-associated pneumonia)   . Heart murmur   . Hyperlipidemia 02/13/2012  . Hypertension   . Hypothyroidism   . Internal hemorrhoids without mention of complication 5/42/7062  . Labile blood pressure   . Left bundle branch block 02/13/2012  . Leg weakness, bilateral   . Long term (current) use of anticoagulants 08/06/2013   Eliquis therapy   . Long term current use of amiodarone 08/07/2016  . Lower extremity weakness   . Migraine 02/13/12   "opthalmic"  . Non-traumatic rhabdomyolysis   . Obesity (BMI 30-39.9) 02/13/2012  . Pain   . Paroxysmal atrial fibrillation (HCC)   . Pneumonia   . Retroperitoneal bleed   . Right ankle pain   . Right knee pain   . Severe aortic regurgitation 02/09/2016  . Special screening for malignant neoplasms, colon 04/11/2013  . Thyroid activity decreased   . Tibial pain   . Varicose vein of leg    right  . Ventricular fibrillation ([place]) 02/09/2016  . Weakness of both lower extremities    Past Surgical History:  Procedure Laterality Date  . CARDIAC CATHETERIZATION N/A 02/17/2016   Procedure: Left Heart Cath and Coronary Angiography;  Surgeon: Jettie Booze, Eric [name];  Location: [place] CV LAB;  Service: Cardiovascular;  Laterality: N/A;  . CARDIOVERSION  03/22/2012   Procedure: CARDIOVERSION;  Surgeon: Candee Furbish, Eric [name];  Location: [facility] ENDOSCOPY;  Service: Cardiovascular;  Laterality: N/A;  . CARDIOVERSION  04/19/2012   Procedure: CARDIOVERSION;  Surgeon: Sinclair Grooms, Eric [name];  Location: [place];  Service: Cardiovascular;  Laterality: N/A;  . COLONOSCOPY N/A 04/11/2013   Procedure: COLONOSCOPY;  Surgeon: Inda Castle, Eric [name];  Location: [facility] ENDOSCOPY;  Service: Endoscopy;  Laterality: N/A;  . COLONOSCOPY N/A 04/11/2013   Procedure: COLONOSCOPY;   Surgeon: Inda Castle, Eric [name];  Location: [facility] ENDOSCOPY;  Service: Endoscopy;  Laterality: N/A;  . EP IMPLANTABLE DEVICE N/A 02/17/2016   Procedure: BiV ICD Insertion CRT-D;  Surgeon: Evans Lance, Eric [name];  Location: [place] CV LAB;  Service: Cardiovascular;  Laterality: N/A;  . FINGER SURGERY  2012   "4th digit right hand; thumb on left hand"  . RADIOLOGY WITH ANESTHESIA N/A 02/11/2016   Procedure: MRI OF THE BRAIN WITHOUT CONTRAST, LUMBAR WITHOUT CONTRAST;  Surgeon: Medication Radiologist, Eric [name];  Location: [place];  Service: Radiology;  Laterality: N/A;  DR. WOOD/MRI  . RIGHT/LEFT HEART CATH AND CORONARY ANGIOGRAPHY N/A 07/26/2018   Procedure: RIGHT/LEFT HEART CATH AND CORONARY ANGIOGRAPHY;  Surgeon: Belva Crome, Eric [name];  Location: [place] CV LAB;  Service: Cardiovascular;  Laterality: N/A;  . Skin melanocytoma excision  2012   "above left clavicle"  . TEE WITHOUT CARDIOVERSION  03/22/2012   Procedure: TRANSESOPHAGEAL ECHOCARDIOGRAM (TEE);  Surgeon: Candee Furbish, Eric [name];  Location: [facility] ENDOSCOPY;  Service: Cardiovascular;  Laterality: N/A;  . TEE WITHOUT CARDIOVERSION N/A  02/08/2016   Procedure: TRANSESOPHAGEAL ECHOCARDIOGRAM (TEE);  Surgeon: Lelon Perla, Eric [name];  Location: [facility] ENDOSCOPY;  Service: Cardiovascular;  Laterality: N/A;     Current Meds  Medication Sig  . acetaminophen (TYLENOL) 500 MG tablet Take 1,500 mg by mouth every 4 (four) hours as needed for moderate pain or headache.   Marland Kitchen amiodarone (PACERONE) 200 MG tablet Take 200 mg by mouth daily.  Marland Kitchen apixaban (ELIQUIS) 5 MG TABS tablet Take 1 tablet (5 mg total) by mouth 2 (two) times daily.  Marland Kitchen aspirin 81 MG chewable tablet Chew 81 mg by mouth daily.  . carvedilol (COREG) 3.125 MG tablet Take 3.125 mg by mouth 2 (two) times daily with a meal.  . cefTRIAXone (ROCEPHIN) IVPB Inject 2 g into the vein daily. Wound infection  . docusate sodium (COLACE) 100 MG capsule Take 100 mg by mouth daily as needed for mild constipation.  . Ferrous Fumarate  (HEMOCYTE - 106 MG FE) 324 (106 Fe) MG TABS tablet Take by mouth daily.  . fluticasone (FLONASE) 50 MCG/ACT nasal spray Place 2 sprays into the nose as needed for allergies.   . furosemide (LASIX) 40 MG tablet Take 40 mg by mouth.  . Hypromellose (ARTIFICIAL TEARS OP) Place 1 drop into both eyes daily as needed (dry eyes).  Marland Kitchen ipratropium (ATROVENT) 0.06 % nasal spray Place 2 sprays into both nostrils 4 (four) times daily.  Marland Kitchen levothyroxine (SYNTHROID, LEVOTHROID) 75 MCG tablet Take 1 tablet (75 mcg total) by mouth See admin instructions. Take 75 mcg daily in the morning except on Sundays take 37.5 mcg in the morning  . oxyCODONE (OXY IR/ROXICODONE) 5 MG immediate release tablet Take by mouth as needed.  . rosuvastatin (CRESTOR) 20 MG tablet TAKE 1 TABLET BY MOUTH DAILY  . vitamin B-12 (CYANOCOBALAMIN) 100 MCG tablet Take 100 mcg by mouth daily.   . vitamin C (ASCORBIC ACID) 500 MG tablet Take 500 mg by mouth daily.      Allergies:   Lactose intolerance (gi)   Social History   Tobacco Use  . Smoking status: Never Smoker  . Smokeless tobacco: Never Used  Substance Use Topics  . Alcohol use: Yes    Comment: 02/13/12 "socially"  . Drug use: No     Family Hx: The patient's family history includes Diabetes in his mother; Heart disease in his father and mother; Heart failure in his father and mother; Hypertension in his father and mother.  ROS:   Please see the history of present illness.    He feels stressed.  He is sleeping okay.  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 All other systems reviewed and are negative.   Prior CV studies:   The following studies were reviewed today:  No new functional or imaging data.  Labs/Other Tests and Data Reviewed:    EKG:  No ECG reviewed.  Recent Labs: 07/22/2018: Hemoglobin 11.9; Platelets 263 10/09/2018: TSH 3.020 12/05/2018: NT-Pro BNP  4,691 12/13/2018: BUN 40; Creatinine, Ser 2.75; Potassium 4.9; Sodium 133   Recent Lipid Panel Lab Results  Component Value Date/Time   CHOL 163 12/27/2016 08:48 AM   TRIG 72 12/27/2016 08:48 AM   HDL 64 12/27/2016 08:48 AM   CHOLHDL 2.5 12/27/2016 08:48 AM   CHOLHDL 3.8 02/13/2012 06:29 PM   LDLCALC 85 12/27/2016 08:48 AM    Wt Readings from Last 3 Encounters:  01/17/19 223  lb (101.2 kg)  12/05/18 232 lb (105.2 kg)  12/05/18 232 lb (105.2 kg)     Objective:    Vital Signs:  Ht 5\' 9"  (1.753 m)   Wt 223 lb (101.2 kg)   BMI 32.93 kg/m    VITAL SIGNS:  reviewed  He appears chronically ill but is in good spirits.  No neurological manifestations of chronic illness.  Precise language and enunciation.  No respiratory difficulty.  ASSESSMENT & PLAN:    1. S/P AVR (aortic valve replacement)   2. S/P flap graft   3. Long term current use of amiodarone   4. Long term current use of anticoagulant   5. S/P ascending aortic aneurysm repair   6. Chronic systolic heart failure ([place])   7. 2019 novel coronavirus disease (COVID-19)   8. Mixed hyperlipidemia   9. CKD (chronic kidney disease), stage IV ([place])   10. Diabetes mellitus type 2 in nonobese (HCC)   11. Paroxysmal atrial fibrillation (HCC)   12. Cardiac arrest ([place])    PLAN:  1. Clinically seems to be doing well.  Have been unable to auscultate his new valve. 2. Still healing the sternal infection.  Was at [facility] yesterday and everything seemed to be going well.  All of the drains have been pulled.  Sutures are still in place.  He will need 4 more weeks of antibiotic therapy. 3. Continue on amiodarone therapy. 4. Continue on apixaban. 5. Status post repair complicated by sternal infection. 6. No evidence of volume overload or symptoms of heart failure. 7. Generally discussed.  No questions or concerns. 8. Not addressed 9. Stable when last checked   Clinical follow-up in 4 to 6 weeks.  We will continue to monitor blood  pressure and weights at home.  Final furosemide dose may change depending upon anticipated improvement in LV function.  We need blood work in 2 weeks to check kidney function.   COVID-19 Education: The signs and symptoms of COVID-19 were discussed with the patient and how to seek care for testing (follow up with PCP or arrange E-visit).  The importance of social distancing was discussed today.  Time:   Today, I have spent 18 minutes with the patient with telehealth technology discussing the above problems.     Medication Adjustments/Labs and Tests Ordered: Current medicines are reviewed at length with the patient today.  Concerns regarding medicines are outlined above.   Tests Ordered: No orders of the defined types were placed in this encounter.   Medication Changes: No orders of the defined types were placed in this encounter.   Disposition:  Follow up in 4 week(s)  Signed, Sinclair Grooms, Eric [name]  01/17/2019 11:57 AM    [place]

## 2019-01-17 ENCOUNTER — Telehealth: Payer: Self-pay

## 2019-01-17 ENCOUNTER — Ambulatory Visit: Payer: Self-pay

## 2019-01-17 ENCOUNTER — Encounter: Payer: Self-pay | Admitting: Interventional Cardiology

## 2019-01-17 ENCOUNTER — Other Ambulatory Visit: Payer: Self-pay

## 2019-01-17 ENCOUNTER — Telehealth (INDEPENDENT_AMBULATORY_CARE_PROVIDER_SITE_OTHER): Payer: Medicare Other | Admitting: Interventional Cardiology

## 2019-01-17 VITALS — Ht 69.0 in | Wt 223.0 lb

## 2019-01-17 DIAGNOSIS — N183 Chronic kidney disease, stage 3 (moderate): Principal | ICD-10-CM

## 2019-01-17 DIAGNOSIS — E1122 Type 2 diabetes mellitus with diabetic chronic kidney disease: Secondary | ICD-10-CM

## 2019-01-17 DIAGNOSIS — I5022 Chronic systolic (congestive) heart failure: Secondary | ICD-10-CM

## 2019-01-17 DIAGNOSIS — I130 Hypertensive heart and chronic kidney disease with heart failure and stage 1 through stage 4 chronic kidney disease, or unspecified chronic kidney disease: Secondary | ICD-10-CM

## 2019-01-17 DIAGNOSIS — Z8679 Personal history of other diseases of the circulatory system: Secondary | ICD-10-CM

## 2019-01-17 DIAGNOSIS — N184 Chronic kidney disease, stage 4 (severe): Secondary | ICD-10-CM

## 2019-01-17 DIAGNOSIS — Z79899 Other long term (current) drug therapy: Secondary | ICD-10-CM

## 2019-01-17 DIAGNOSIS — E119 Type 2 diabetes mellitus without complications: Secondary | ICD-10-CM

## 2019-01-17 DIAGNOSIS — E782 Mixed hyperlipidemia: Secondary | ICD-10-CM

## 2019-01-17 DIAGNOSIS — Z9889 Other specified postprocedural states: Secondary | ICD-10-CM

## 2019-01-17 DIAGNOSIS — Z952 Presence of prosthetic heart valve: Secondary | ICD-10-CM | POA: Diagnosis not present

## 2019-01-17 DIAGNOSIS — Z7901 Long term (current) use of anticoagulants: Secondary | ICD-10-CM

## 2019-01-17 DIAGNOSIS — I469 Cardiac arrest, cause unspecified: Secondary | ICD-10-CM

## 2019-01-17 DIAGNOSIS — I480 Paroxysmal atrial fibrillation: Secondary | ICD-10-CM

## 2019-01-17 NOTE — Telephone Encounter (Signed)
YOUR CARDIOLOGY TEAM HAS ARRANGED FOR AN E-VISIT FOR YOUR APPOINTMENT - PLEASE REVIEW IMPORTANT INFORMATION BELOW SEVERAL DAYS PRIOR TO YOUR APPOINTMENT  Due to the recent COVID-19 pandemic, we are transitioning in-person office visits to tele-medicine visits in an effort to decrease unnecessary exposure to our patients, their families, and staff. These visits are billed to your insurance just like a normal visit is. We also encourage you to sign up for MyChart if you have not already done so. You will need a smartphone if possible. For patients that do not have this, we can still complete the visit using a regular telephone but do prefer a smartphone to enable video when possible. You may have a family member that lives with you that can help. If possible, we also ask that you have a blood pressure cuff and scale at home to measure your blood pressure, heart rate and weight prior to your scheduled appointment. Patients with clinical needs that need an in-person evaluation and testing will still be able to come to the office if absolutely necessary. If you have any questions, feel free to call our office.     YOUR PROVIDER WILL BE USING THE FOLLOWING PLATFORM TO COMPLETE YOUR VISIT: Doximity  . IF USING MYCHART - How to Download the MyChart App to Your SmartPhone   - If Apple, go to App Store and type in MyChart in the search bar and download the app. If Android, ask patient to go to Google Play Store and type in MyChart in the search bar and download the app. The app is free but as with any other app downloads, your phone may require you to verify saved payment information or Apple/Android password.  - You will need to then log into the app with your MyChart username and password, and select [facility] as your healthcare provider to link the account.  - When it is time for your visit, go to the MyChart app, find appointments, and click Begin Video Visit. Be sure to Select Allow for your device to  access the Microphone and Camera for your visit. You will then be connected, and your provider will be with you shortly.  **If you have any issues connecting or need assistance, please contact MyChart service desk [phone] ([phone])**  **If using a computer, in order to ensure the best quality for your visit, you will need to use either of the following Internet Browsers: Google Chrome or Microsoft Edge**  . IF USING DOXIMITY or DOXY.ME - The staff will give you instructions on receiving your link to join the meeting the day of your visit.      2-3 DAYS BEFORE YOUR APPOINTMENT  You will receive a telephone call from one of our HeartCare team members - your caller ID may say "Unknown caller." If this is a video visit, we will walk you through how to get the video launched on your phone. We will remind you check your blood pressure, heart rate and weight prior to your scheduled appointment. If you have an Apple Watch or Kardia, please upload any pertinent ECG strips the day before or morning of your appointment to MyChart. Our staff will also make sure you have reviewed the consent and agree to move forward with your scheduled tele-health visit.     THE DAY OF YOUR APPOINTMENT  Approximately 15 minutes prior to your scheduled appointment, you will receive a telephone call from one of HeartCare team - your caller ID may say "Unknown caller."    Our staff will confirm medications, vital signs for the day and any symptoms you may be experiencing. Please have this information available prior to the time of visit start. It may also be helpful for you to have a pad of paper and pen handy for any instructions given during your visit. They will also walk you through joining the smartphone meeting if this is a video visit.    CONSENT FOR TELE-HEALTH VISIT - PLEASE REVIEW  I hereby voluntarily request, consent and authorize [facility] and its employed or contracted physicians, physician  assistants, nurse practitioners or other licensed health care professionals (the Practitioner), to provide me with telemedicine health care services (the "Services") as deemed necessary by the treating Practitioner. I acknowledge and consent to receive the Services by the Practitioner via telemedicine. I understand that the telemedicine visit will involve communicating with the Practitioner through live audiovisual communication technology and the disclosure of certain medical information by electronic transmission. I acknowledge that I have been given the opportunity to request an in-person assessment or other available alternative prior to the telemedicine visit and am voluntarily participating in the telemedicine visit.  I understand that I have the right to withhold or withdraw my consent to the use of telemedicine in the course of my care at any time, without affecting my right to future care or treatment, and that the Practitioner or I may terminate the telemedicine visit at any time. I understand that I have the right to inspect all information obtained and/or recorded in the course of the telemedicine visit and may receive copies of available information for a reasonable fee.  I understand that some of the potential risks of receiving the Services via telemedicine include:  . Delay or interruption in medical evaluation due to technological equipment failure or disruption; . Information transmitted may not be sufficient (e.g. poor resolution of images) to allow for appropriate medical decision making by the Practitioner; and/or  . In rare instances, security protocols could fail, causing a breach of personal health information.  Furthermore, I acknowledge that it is my responsibility to provide information about my medical history, conditions and care that is complete and accurate to the best of my ability. I acknowledge that Practitioner's advice, recommendations, and/or decision may be based on  factors not within their control, such as incomplete or inaccurate data provided by me or distortions of diagnostic images or specimens that may result from electronic transmissions. I understand that the practice of medicine is not an exact science and that Practitioner makes no warranties or guarantees regarding treatment outcomes. I acknowledge that I will receive a copy of this consent concurrently upon execution via email to the email address I last provided but may also request a printed copy by calling the office of [facility].    I understand that my insurance will be billed for this visit.   I have read or had this consent read to me. . I understand the contents of this consent, which adequately explains the benefits and risks of the Services being provided via telemedicine.  . I have been provided ample opportunity to ask questions regarding this consent and the Services and have had my questions answered to my satisfaction. . I give my informed consent for the services to be provided through the use of telemedicine in my medical care  By participating in this telemedicine visit I agree to the above.  

## 2019-01-17 NOTE — Patient Instructions (Signed)
Medication Instructions:  Your physician recommends that you continue on your current medications as directed. Please refer to the Current Medication list given to you today.  If you need a refill on your cardiac medications before your next appointment, please call your pharmacy.   Lab work: None If you have labs (blood work) drawn today and your tests are completely normal, you will receive your results only by: Marland Kitchen MyChart Message (if you have MyChart) OR . A paper copy in the mail If you have any lab test that is abnormal or we need to change your treatment, we will call you to review the results.  Testing/Procedures: None  Follow-Up: At [facility], you and your health needs are our priority.  As part of our continuing mission to provide you with exceptional heart care, we have created designated Provider Care Teams.  These Care Teams include your primary Cardiologist (physician) and Advanced Practice Providers (APPs -  Physician Assistants and Nurse Practitioners) who all work together to provide you with the care you need, when you need it. You will need a follow up appointment in 4-6 weeks.  Please call our office 2 months in advance to schedule this appointment.  You may see Dr. Tamala Julian or one of the following Advanced Practice Providers on your designated Care Team:   Truitt Merle, NP Cecilie Kicks, NP . Kathyrn Drown, NP  Any Other Special Instructions Will Be Listed Below (If Applicable).

## 2019-01-17 NOTE — Chronic Care Management (AMB) (Signed)
  Chronic Care Management   Telephone Outreach Note  01/17/2019 Name: Eric [name]., MD MRN: [id number] DOB: Jun 07, 1951  Referred by: patient's health plan.   I reached out to Mr. [name]., MD today by phone in response to a referral sent by Mr. [name]., MD's health plan. I spoke with the patients spouse/caregiver [place].  Mrs.  Sprinkle was given information about Chronic Care Management services today including:  1. CCM service includes personalized support from designated clinical staff supervised by the patients physician, including individualized plan of care and coordination with other care providers 2. 24/7 contact phone numbers for assistance for urgent and routine care needs. 3. Service will only be billed when office clinical staff spend 20 minutes or more in a month to coordinate care. 4. Only one practitioner may furnish and bill the service in a calendar month. 5. The patient may stop CCM services at any time (effective at the end of the month) by phone call to the office staff. 6. The patient will be responsible for cost sharing (co-pay) of up to 20% of the service fee (after annual deductible is met).  Patient did not agree to services and does not wish to consider at this time. The patient is active with home health. The patients spouse feels they have an abundance of care team members at this time and feels enrolling in the CCM program may be overwhelming.   No further follow up required: The patients spouse, who is also enrolled in the CCM program, was encouraged to contact the CCM team should the patient want to enroill at a later time.   Glendale Chard, MD has been notified of this outreach and Mr. Kekai Geter., MD's decision and plan.   Daneen Schick, BSW, CDP [facility] Care Management Social Worker [phone]  Total time spent performing care coordination and/or care management activities with the patient by phone or face to face = 10  minutes.

## 2019-01-18 DIAGNOSIS — I130 Hypertensive heart and chronic kidney disease with heart failure and stage 1 through stage 4 chronic kidney disease, or unspecified chronic kidney disease: Secondary | ICD-10-CM | POA: Diagnosis not present

## 2019-01-18 DIAGNOSIS — B957 Other staphylococcus as the cause of diseases classified elsewhere: Secondary | ICD-10-CM | POA: Diagnosis not present

## 2019-01-18 DIAGNOSIS — B961 Klebsiella pneumoniae [K. pneumoniae] as the cause of diseases classified elsewhere: Secondary | ICD-10-CM | POA: Diagnosis not present

## 2019-01-18 DIAGNOSIS — T8131XA Disruption of external operation (surgical) wound, not elsewhere classified, initial encounter: Secondary | ICD-10-CM | POA: Diagnosis not present

## 2019-01-18 DIAGNOSIS — T8141XA Infection following a procedure, superficial incisional surgical site, initial encounter: Secondary | ICD-10-CM | POA: Diagnosis not present

## 2019-01-18 DIAGNOSIS — I5022 Chronic systolic (congestive) heart failure: Secondary | ICD-10-CM | POA: Diagnosis not present

## 2019-01-21 DIAGNOSIS — I5022 Chronic systolic (congestive) heart failure: Secondary | ICD-10-CM | POA: Diagnosis not present

## 2019-01-21 DIAGNOSIS — I130 Hypertensive heart and chronic kidney disease with heart failure and stage 1 through stage 4 chronic kidney disease, or unspecified chronic kidney disease: Secondary | ICD-10-CM | POA: Diagnosis not present

## 2019-01-21 DIAGNOSIS — B957 Other staphylococcus as the cause of diseases classified elsewhere: Secondary | ICD-10-CM | POA: Diagnosis not present

## 2019-01-21 DIAGNOSIS — B961 Klebsiella pneumoniae [K. pneumoniae] as the cause of diseases classified elsewhere: Secondary | ICD-10-CM | POA: Diagnosis not present

## 2019-01-21 DIAGNOSIS — T8141XA Infection following a procedure, superficial incisional surgical site, initial encounter: Secondary | ICD-10-CM | POA: Diagnosis not present

## 2019-01-21 DIAGNOSIS — T8131XA Disruption of external operation (surgical) wound, not elsewhere classified, initial encounter: Secondary | ICD-10-CM | POA: Diagnosis not present

## 2019-01-21 NOTE — Telephone Encounter (Signed)
error 

## 2019-01-22 ENCOUNTER — Other Ambulatory Visit: Payer: Self-pay

## 2019-01-22 ENCOUNTER — Ambulatory Visit (INDEPENDENT_AMBULATORY_CARE_PROVIDER_SITE_OTHER): Payer: Medicare Other | Admitting: *Deleted

## 2019-01-22 DIAGNOSIS — I469 Cardiac arrest, cause unspecified: Secondary | ICD-10-CM

## 2019-01-22 DIAGNOSIS — I5022 Chronic systolic (congestive) heart failure: Secondary | ICD-10-CM | POA: Diagnosis not present

## 2019-01-23 DIAGNOSIS — B957 Other staphylococcus as the cause of diseases classified elsewhere: Secondary | ICD-10-CM | POA: Diagnosis not present

## 2019-01-23 DIAGNOSIS — T8141XA Infection following a procedure, superficial incisional surgical site, initial encounter: Secondary | ICD-10-CM | POA: Diagnosis not present

## 2019-01-23 DIAGNOSIS — B961 Klebsiella pneumoniae [K. pneumoniae] as the cause of diseases classified elsewhere: Secondary | ICD-10-CM | POA: Diagnosis not present

## 2019-01-23 DIAGNOSIS — I130 Hypertensive heart and chronic kidney disease with heart failure and stage 1 through stage 4 chronic kidney disease, or unspecified chronic kidney disease: Secondary | ICD-10-CM | POA: Diagnosis not present

## 2019-01-23 DIAGNOSIS — I5022 Chronic systolic (congestive) heart failure: Secondary | ICD-10-CM | POA: Diagnosis not present

## 2019-01-23 DIAGNOSIS — T8131XA Disruption of external operation (surgical) wound, not elsewhere classified, initial encounter: Secondary | ICD-10-CM | POA: Diagnosis not present

## 2019-01-23 LAB — CUP PACEART REMOTE DEVICE CHECK
Battery Remaining Longevity: 46 mo
Battery Remaining Percentage: 63 %
Battery Voltage: 2.95 V
Brady Statistic AP VP Percent: 56 %
Brady Statistic AP VS Percent: 3.8 %
Brady Statistic AS VP Percent: 34 %
Brady Statistic AS VS Percent: 3.4 %
Brady Statistic RA Percent Paced: 6.5 %
Brady Statistic RV Percent Paced: 87 %
Date Time Interrogation Session: 20200428210338
HighPow Impedance: 54 Ohm
HighPow Impedance: 54 Ohm
Implantable Lead Implant Date: 20170525
Implantable Lead Implant Date: 20170525
Implantable Lead Implant Date: 20170525
Implantable Lead Location: 753858
Implantable Lead Location: 753859
Implantable Lead Location: 753860
Implantable Lead Model: 7122
Implantable Pulse Generator Implant Date: 20170525
Lead Channel Impedance Value: 380 Ohm
Lead Channel Impedance Value: 380 Ohm
Lead Channel Impedance Value: 550 Ohm
Lead Channel Sensing Intrinsic Amplitude: 0.3 mV
Lead Channel Sensing Intrinsic Amplitude: 11.4 mV
Lead Channel Setting Pacing Amplitude: 2 V
Lead Channel Setting Pacing Amplitude: 2 V
Lead Channel Setting Pacing Amplitude: 2.5 V
Lead Channel Setting Pacing Pulse Width: 0.5 ms
Lead Channel Setting Pacing Pulse Width: 0.5 ms
Lead Channel Setting Sensing Sensitivity: 0.5 mV
Pulse Gen Serial Number: 7357926

## 2019-01-28 ENCOUNTER — Encounter: Payer: Medicare Other | Admitting: Internal Medicine

## 2019-01-28 DIAGNOSIS — I130 Hypertensive heart and chronic kidney disease with heart failure and stage 1 through stage 4 chronic kidney disease, or unspecified chronic kidney disease: Secondary | ICD-10-CM | POA: Diagnosis not present

## 2019-01-28 DIAGNOSIS — B961 Klebsiella pneumoniae [K. pneumoniae] as the cause of diseases classified elsewhere: Secondary | ICD-10-CM | POA: Diagnosis not present

## 2019-01-28 DIAGNOSIS — T8141XA Infection following a procedure, superficial incisional surgical site, initial encounter: Secondary | ICD-10-CM | POA: Diagnosis not present

## 2019-01-28 DIAGNOSIS — I5022 Chronic systolic (congestive) heart failure: Secondary | ICD-10-CM | POA: Diagnosis not present

## 2019-01-28 DIAGNOSIS — B957 Other staphylococcus as the cause of diseases classified elsewhere: Secondary | ICD-10-CM | POA: Diagnosis not present

## 2019-01-28 DIAGNOSIS — T8131XA Disruption of external operation (surgical) wound, not elsewhere classified, initial encounter: Secondary | ICD-10-CM | POA: Diagnosis not present

## 2019-01-29 DIAGNOSIS — B957 Other staphylococcus as the cause of diseases classified elsewhere: Secondary | ICD-10-CM | POA: Diagnosis not present

## 2019-01-29 DIAGNOSIS — I130 Hypertensive heart and chronic kidney disease with heart failure and stage 1 through stage 4 chronic kidney disease, or unspecified chronic kidney disease: Secondary | ICD-10-CM | POA: Diagnosis not present

## 2019-01-29 DIAGNOSIS — T8141XA Infection following a procedure, superficial incisional surgical site, initial encounter: Secondary | ICD-10-CM | POA: Diagnosis not present

## 2019-01-29 DIAGNOSIS — I5022 Chronic systolic (congestive) heart failure: Secondary | ICD-10-CM | POA: Diagnosis not present

## 2019-01-29 DIAGNOSIS — T8131XA Disruption of external operation (surgical) wound, not elsewhere classified, initial encounter: Secondary | ICD-10-CM | POA: Diagnosis not present

## 2019-01-29 DIAGNOSIS — B961 Klebsiella pneumoniae [K. pneumoniae] as the cause of diseases classified elsewhere: Secondary | ICD-10-CM | POA: Diagnosis not present

## 2019-01-30 DIAGNOSIS — I272 Pulmonary hypertension, unspecified: Secondary | ICD-10-CM | POA: Diagnosis not present

## 2019-01-30 DIAGNOSIS — Z950 Presence of cardiac pacemaker: Secondary | ICD-10-CM | POA: Diagnosis not present

## 2019-01-30 DIAGNOSIS — Z8679 Personal history of other diseases of the circulatory system: Secondary | ICD-10-CM | POA: Diagnosis not present

## 2019-01-30 DIAGNOSIS — T8132XD Disruption of internal operation (surgical) wound, not elsewhere classified, subsequent encounter: Secondary | ICD-10-CM | POA: Diagnosis not present

## 2019-01-30 DIAGNOSIS — Z9889 Other specified postprocedural states: Secondary | ICD-10-CM | POA: Diagnosis not present

## 2019-01-30 DIAGNOSIS — Z952 Presence of prosthetic heart valve: Secondary | ICD-10-CM | POA: Diagnosis not present

## 2019-01-30 DIAGNOSIS — Z452 Encounter for adjustment and management of vascular access device: Secondary | ICD-10-CM | POA: Diagnosis not present

## 2019-01-30 DIAGNOSIS — R931 Abnormal findings on diagnostic imaging of heart and coronary circulation: Secondary | ICD-10-CM | POA: Diagnosis not present

## 2019-01-31 DIAGNOSIS — B957 Other staphylococcus as the cause of diseases classified elsewhere: Secondary | ICD-10-CM | POA: Diagnosis not present

## 2019-01-31 DIAGNOSIS — T8131XA Disruption of external operation (surgical) wound, not elsewhere classified, initial encounter: Secondary | ICD-10-CM | POA: Diagnosis not present

## 2019-01-31 DIAGNOSIS — T8141XA Infection following a procedure, superficial incisional surgical site, initial encounter: Secondary | ICD-10-CM | POA: Diagnosis not present

## 2019-01-31 DIAGNOSIS — B961 Klebsiella pneumoniae [K. pneumoniae] as the cause of diseases classified elsewhere: Secondary | ICD-10-CM | POA: Diagnosis not present

## 2019-01-31 DIAGNOSIS — I5022 Chronic systolic (congestive) heart failure: Secondary | ICD-10-CM | POA: Diagnosis not present

## 2019-01-31 DIAGNOSIS — I130 Hypertensive heart and chronic kidney disease with heart failure and stage 1 through stage 4 chronic kidney disease, or unspecified chronic kidney disease: Secondary | ICD-10-CM | POA: Diagnosis not present

## 2019-01-31 NOTE — Progress Notes (Signed)
Remote ICD transmission.   

## 2019-02-04 ENCOUNTER — Encounter: Payer: Self-pay | Admitting: Podiatry

## 2019-02-04 ENCOUNTER — Other Ambulatory Visit: Payer: Self-pay

## 2019-02-04 ENCOUNTER — Ambulatory Visit (INDEPENDENT_AMBULATORY_CARE_PROVIDER_SITE_OTHER): Payer: Medicare Other | Admitting: Podiatry

## 2019-02-04 VITALS — Temp 97.7°F

## 2019-02-04 DIAGNOSIS — I130 Hypertensive heart and chronic kidney disease with heart failure and stage 1 through stage 4 chronic kidney disease, or unspecified chronic kidney disease: Secondary | ICD-10-CM | POA: Diagnosis not present

## 2019-02-04 DIAGNOSIS — M79674 Pain in right toe(s): Secondary | ICD-10-CM | POA: Diagnosis not present

## 2019-02-04 DIAGNOSIS — L608 Other nail disorders: Secondary | ICD-10-CM

## 2019-02-04 DIAGNOSIS — T8141XA Infection following a procedure, superficial incisional surgical site, initial encounter: Secondary | ICD-10-CM | POA: Diagnosis not present

## 2019-02-04 DIAGNOSIS — B351 Tinea unguium: Secondary | ICD-10-CM | POA: Diagnosis not present

## 2019-02-04 DIAGNOSIS — M79675 Pain in left toe(s): Secondary | ICD-10-CM | POA: Diagnosis not present

## 2019-02-04 DIAGNOSIS — D689 Coagulation defect, unspecified: Secondary | ICD-10-CM

## 2019-02-04 DIAGNOSIS — T8131XA Disruption of external operation (surgical) wound, not elsewhere classified, initial encounter: Secondary | ICD-10-CM | POA: Diagnosis not present

## 2019-02-04 DIAGNOSIS — E119 Type 2 diabetes mellitus without complications: Secondary | ICD-10-CM

## 2019-02-04 DIAGNOSIS — B957 Other staphylococcus as the cause of diseases classified elsewhere: Secondary | ICD-10-CM | POA: Diagnosis not present

## 2019-02-04 DIAGNOSIS — B961 Klebsiella pneumoniae [K. pneumoniae] as the cause of diseases classified elsewhere: Secondary | ICD-10-CM | POA: Diagnosis not present

## 2019-02-04 DIAGNOSIS — I5022 Chronic systolic (congestive) heart failure: Secondary | ICD-10-CM | POA: Diagnosis not present

## 2019-02-04 NOTE — Progress Notes (Signed)
Complaint:  Visit Type: Patient returns to my office for continued preventative foot care services. Complaint: Patient states" my nails have grown long and thick and become painful to walk and wear shoes" Patient has been diagnosed with DM with no foot complications. The patient presents for preventative foot care services. No changes to ROS.  Patient is taking eliquiss.  Podiatric Exam: Vascular: dorsalis pedis and posterior tibial pulses are palpable bilateral. Capillary return is immediate. Temperature gradient is WNL. Skin turgor WNL  Sensorium: Normal Semmes Weinstein monofilament test. Normal tactile sensation bilaterally. Nail Exam: Pt has thick disfigured discolored nails with subungual debris noted bilateral entire nail hallux through fifth toenails.  Pincer nails noted. Ulcer Exam: There is no evidence of ulcer or pre-ulcerative changes or infection. Orthopedic Exam: Muscle tone and strength are WNL. No limitations in general ROM. No crepitus or effusions noted. Foot type and digits show no abnormalities. Bony prominences are unremarkable. Skin: No Porokeratosis. No infection or ulcers  Diagnosis:  Onychomycosis, , Pain in right toe, pain in left toes Pincer nails    Treatment & Plan Procedures and Treatment: Consent by patient was obtained for treatment procedures.   Debridement of mycotic and hypertrophic toenails, 1 through 5 bilateral and clearing of subungual debris. No ulceration, no infection noted.  Return Visit-Office Procedure: Patient instructed to return to the office for a follow up visit 3 months for continued evaluation and treatment.    Gardiner Barefoot DPM

## 2019-02-05 DIAGNOSIS — B961 Klebsiella pneumoniae [K. pneumoniae] as the cause of diseases classified elsewhere: Secondary | ICD-10-CM | POA: Diagnosis not present

## 2019-02-05 DIAGNOSIS — I5022 Chronic systolic (congestive) heart failure: Secondary | ICD-10-CM | POA: Diagnosis not present

## 2019-02-05 DIAGNOSIS — B957 Other staphylococcus as the cause of diseases classified elsewhere: Secondary | ICD-10-CM | POA: Diagnosis not present

## 2019-02-05 DIAGNOSIS — T8141XA Infection following a procedure, superficial incisional surgical site, initial encounter: Secondary | ICD-10-CM | POA: Diagnosis not present

## 2019-02-05 DIAGNOSIS — T8131XA Disruption of external operation (surgical) wound, not elsewhere classified, initial encounter: Secondary | ICD-10-CM | POA: Diagnosis not present

## 2019-02-05 DIAGNOSIS — I130 Hypertensive heart and chronic kidney disease with heart failure and stage 1 through stage 4 chronic kidney disease, or unspecified chronic kidney disease: Secondary | ICD-10-CM | POA: Diagnosis not present

## 2019-02-06 ENCOUNTER — Telehealth: Payer: Self-pay | Admitting: Internal Medicine

## 2019-02-06 NOTE — Telephone Encounter (Signed)
New message    [facility] for pt to call back about appt on 05.19.20. Will offer pt doxemity video if pt has a smart phone. If no smart phone, will offer phone visit. If pt returns call, please reach out via secure chat. I will speak to pt.

## 2019-02-06 NOTE — Telephone Encounter (Signed)
Follow up     Pt scheduled for virtual visit with Dr. Lovena Le on 05.19.20. pt will do video call. Pt has 2 numbers listed in appt notes. The first number is the pt phone number-he said he has been having trouble with his phone but to try sending the link to that phone. The second number is the alternate number the link can be sent to if the first number does not work.       Virtual Visit Pre-Appointment Phone Call  "(Name), I am calling you today to discuss your upcoming appointment. We are currently trying to limit exposure to the virus that causes COVID-19 by seeing patients at home rather than in the office."  1. "What is the BEST phone number to call the day of the visit?" - include this in appointment notes  2. Do you have or have access to (through a family member/friend) a smartphone with video capability that we can use for your visit?" a. If yes - list this number in appt notes as cell (if different from BEST phone #) and list the appointment type as a VIDEO visit in appointment notes b. If no - list the appointment type as a PHONE visit in appointment notes  3. Confirm consent - "In the setting of the current Covid19 crisis, you are scheduled for a (phone or video) visit with your provider on (date) at (time).  Just as we do with many in-office visits, in order for you to participate in this visit, we must obtain consent.  If you'd like, I can send this to your mychart (if signed up) or email for you to review.  Otherwise, I can obtain your verbal consent now.  All virtual visits are billed to your insurance company just like a normal visit would be.  By agreeing to a virtual visit, we'd like you to understand that the technology does not allow for your provider to perform an examination, and thus may limit your provider's ability to fully assess your condition. If your provider identifies any concerns that need to be evaluated in person, we will make arrangements to do so.  Finally,  though the technology is pretty good, we cannot assure that it will always work on either your or our end, and in the setting of a video visit, we may have to convert it to a phone-only visit.  In either situation, we cannot ensure that we have a secure connection.  Are you willing to proceed?" STAFF: Did the patient verbally acknowledge consent to telehealth visit? Document YES/NO here: YES  4. Advise patient to be prepared - "Two hours prior to your appointment, go ahead and check your blood pressure, pulse, oxygen saturation, and your weight (if you have the equipment to check those) and write them all down. When your visit starts, your provider will ask you for this information. If you have an Apple Watch or Kardia device, please plan to have heart rate information ready on the day of your appointment. Please have a pen and paper handy nearby the day of the visit as well."  5. Give patient instructions for MyChart download to smartphone OR Doximity/Doxy.me as below if video visit (depending on what platform provider is using)  6. 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 I spoke with the patient via phone to ensure availability of phone/video source, confirm preferred email & phone number, and discuss instructions and expectations.  I reminded Eric [name]., MD to be prepared with any vital sign and/or heart rhythm information that could potentially be obtained via home monitoring, at the time of his visit. I reminded Eric [name]., MD to expect a phone call prior to his visit.  Alben Spittle 02/06/2019 10:48 AM   INSTRUCTIONS FOR DOWNLOADING THE MYCHART APP TO SMARTPHONE  - The patient must first make sure  to have activated MyChart and know their login information - If Apple, go to [employer] and type in MyChart in the search bar and download the app. If Android, ask patient to go to [employer] and type in [place] in the search bar and download the app. The app is free but as with any other app downloads, their phone may require them to verify saved payment information or Apple/Android password.  - The patient will need to then log into the app with their MyChart username and password, and select [facility] as their healthcare provider to link the account. When it is time for your visit, go to the MyChart app, find appointments, and click Begin Video Visit. Be sure to Select Allow for your device to access the Microphone and Camera for your visit. You will then be connected, and your provider will be with you shortly.  **If they have any issues connecting, or need assistance please contact MyChart service desk [phone])**  **If using a computer, in order to ensure the best quality for their visit they will need to use either of the following Internet Browsers: [employer], or Google Chrome**  IF USING DOXIMITY or DOXY.ME - The patient will receive a link just prior to their visit by text.     FULL LENGTH CONSENT FOR TELE-HEALTH VISIT   I hereby voluntarily request, consent and authorize [facility] and its employed or contracted physicians, physician assistants, nurse practitioners or other licensed health care professionals (the Practitioner), to provide me with telemedicine health care services (the Services") as deemed necessary by the treating Practitioner. I acknowledge and consent to receive the Services by the Practitioner via telemedicine. I understand that the telemedicine visit will involve communicating with the Practitioner through live audiovisual communication technology and the disclosure of certain medical information by electronic transmission. I  acknowledge that I have been given the opportunity to request an in-person assessment or other available alternative prior to the telemedicine visit and am voluntarily participating in the telemedicine visit.  I understand that I have the right to withhold or withdraw my consent to the use of telemedicine in the course of my care at any time, without affecting my right to future care or treatment, and that the Practitioner or I may terminate the telemedicine visit at any time. I understand that I have the right to inspect all information obtained and/or recorded in the course of the telemedicine visit and may receive copies of available information for a reasonable fee.  I understand that some of the potential risks of receiving the Services via telemedicine include:   Delay or interruption in medical evaluation due to technological equipment failure or disruption;  Information transmitted may not be sufficient (e.g. poor resolution of images) to allow for appropriate medical decision making by the Practitioner; and/or   In rare  instances, security protocols could fail, causing a breach of personal health information.  Furthermore, I acknowledge that it is my responsibility to provide information about my medical history, conditions and care that is complete and accurate to the best of my ability. I acknowledge that Practitioner's advice, recommendations, and/or decision may be based on factors not within their control, such as incomplete or inaccurate data provided by me or distortions of diagnostic images or specimens that may result from electronic transmissions. I understand that the practice of medicine is not an exact science and that Practitioner makes no warranties or guarantees regarding treatment outcomes. I acknowledge that I will receive a copy of this consent concurrently upon execution via email to the email address I last provided but may also request a printed copy by calling the office of  [facility].    I understand that my insurance will be billed for this visit.   I have read or had this consent read to me.  I understand the contents of this consent, which adequately explains the benefits and risks of the Services being provided via telemedicine.   I have been provided ample opportunity to ask questions regarding this consent and the Services and have had my questions answered to my satisfaction.  I give my informed consent for the services to be provided through the use of telemedicine in my medical care  By participating in this telemedicine visit I agree to the above.

## 2019-02-07 DIAGNOSIS — B957 Other staphylococcus as the cause of diseases classified elsewhere: Secondary | ICD-10-CM | POA: Diagnosis not present

## 2019-02-07 DIAGNOSIS — B961 Klebsiella pneumoniae [K. pneumoniae] as the cause of diseases classified elsewhere: Secondary | ICD-10-CM | POA: Diagnosis not present

## 2019-02-07 DIAGNOSIS — T8141XA Infection following a procedure, superficial incisional surgical site, initial encounter: Secondary | ICD-10-CM | POA: Diagnosis not present

## 2019-02-07 DIAGNOSIS — I5022 Chronic systolic (congestive) heart failure: Secondary | ICD-10-CM | POA: Diagnosis not present

## 2019-02-07 DIAGNOSIS — T8131XA Disruption of external operation (surgical) wound, not elsewhere classified, initial encounter: Secondary | ICD-10-CM | POA: Diagnosis not present

## 2019-02-07 DIAGNOSIS — I130 Hypertensive heart and chronic kidney disease with heart failure and stage 1 through stage 4 chronic kidney disease, or unspecified chronic kidney disease: Secondary | ICD-10-CM | POA: Diagnosis not present

## 2019-02-11 ENCOUNTER — Other Ambulatory Visit: Payer: Self-pay

## 2019-02-11 ENCOUNTER — Telehealth (INDEPENDENT_AMBULATORY_CARE_PROVIDER_SITE_OTHER): Payer: Medicare Other | Admitting: Internal Medicine

## 2019-02-11 DIAGNOSIS — B957 Other staphylococcus as the cause of diseases classified elsewhere: Secondary | ICD-10-CM | POA: Diagnosis not present

## 2019-02-11 DIAGNOSIS — I4901 Ventricular fibrillation: Secondary | ICD-10-CM

## 2019-02-11 DIAGNOSIS — E039 Hypothyroidism, unspecified: Secondary | ICD-10-CM | POA: Diagnosis not present

## 2019-02-11 DIAGNOSIS — Z792 Long term (current) use of antibiotics: Secondary | ICD-10-CM | POA: Diagnosis not present

## 2019-02-11 DIAGNOSIS — I480 Paroxysmal atrial fibrillation: Secondary | ICD-10-CM | POA: Diagnosis not present

## 2019-02-11 DIAGNOSIS — Z452 Encounter for adjustment and management of vascular access device: Secondary | ICD-10-CM | POA: Diagnosis not present

## 2019-02-11 DIAGNOSIS — T8131XA Disruption of external operation (surgical) wound, not elsewhere classified, initial encounter: Secondary | ICD-10-CM | POA: Diagnosis not present

## 2019-02-11 DIAGNOSIS — I351 Nonrheumatic aortic (valve) insufficiency: Secondary | ICD-10-CM | POA: Diagnosis not present

## 2019-02-11 DIAGNOSIS — T8141XA Infection following a procedure, superficial incisional surgical site, initial encounter: Secondary | ICD-10-CM | POA: Diagnosis not present

## 2019-02-11 DIAGNOSIS — E1140 Type 2 diabetes mellitus with diabetic neuropathy, unspecified: Secondary | ICD-10-CM | POA: Diagnosis not present

## 2019-02-11 DIAGNOSIS — Z8701 Personal history of pneumonia (recurrent): Secondary | ICD-10-CM | POA: Diagnosis not present

## 2019-02-11 DIAGNOSIS — B961 Klebsiella pneumoniae [K. pneumoniae] as the cause of diseases classified elsewhere: Secondary | ICD-10-CM | POA: Diagnosis not present

## 2019-02-11 DIAGNOSIS — D631 Anemia in chronic kidney disease: Secondary | ICD-10-CM | POA: Diagnosis not present

## 2019-02-11 DIAGNOSIS — I5022 Chronic systolic (congestive) heart failure: Secondary | ICD-10-CM | POA: Diagnosis not present

## 2019-02-11 DIAGNOSIS — E1122 Type 2 diabetes mellitus with diabetic chronic kidney disease: Secondary | ICD-10-CM | POA: Diagnosis not present

## 2019-02-11 DIAGNOSIS — I130 Hypertensive heart and chronic kidney disease with heart failure and stage 1 through stage 4 chronic kidney disease, or unspecified chronic kidney disease: Secondary | ICD-10-CM | POA: Diagnosis not present

## 2019-02-11 DIAGNOSIS — N184 Chronic kidney disease, stage 4 (severe): Secondary | ICD-10-CM | POA: Diagnosis not present

## 2019-02-11 DIAGNOSIS — I4892 Unspecified atrial flutter: Secondary | ICD-10-CM | POA: Diagnosis not present

## 2019-02-11 DIAGNOSIS — I428 Other cardiomyopathies: Secondary | ICD-10-CM | POA: Diagnosis not present

## 2019-02-11 DIAGNOSIS — R631 Polydipsia: Secondary | ICD-10-CM | POA: Diagnosis not present

## 2019-02-11 DIAGNOSIS — Z9181 History of falling: Secondary | ICD-10-CM | POA: Diagnosis not present

## 2019-02-11 DIAGNOSIS — Z7901 Long term (current) use of anticoagulants: Secondary | ICD-10-CM | POA: Diagnosis not present

## 2019-02-11 DIAGNOSIS — I447 Left bundle-branch block, unspecified: Secondary | ICD-10-CM | POA: Diagnosis not present

## 2019-02-11 DIAGNOSIS — Z95810 Presence of automatic (implantable) cardiac defibrillator: Secondary | ICD-10-CM | POA: Diagnosis not present

## 2019-02-11 NOTE — Progress Notes (Signed)
Electrophysiology TeleHealth Note   Due to national recommendations of social distancing due to COVID 19, an audio/video telehealth visit is felt to be most appropriate for this patient at this time.  See MyChart message from today for the patient's consent to telehealth for [facility].   Date:  02/11/2019   ID:  Eric Corning., Eric [name], DOB [birth year], MRN [id number]  Location: patient's home  Provider location: [address] [place]  Evaluation Performed: Follow-up visit  PCP:  [place] Chard, Eric [name]  Cardiologist:  No primary care provider on file. [place] Electrophysiologist:  Dr Lovena Le  Chief Complaint: "I have been doing ok."  History of Present Illness:    Eric Corning., Eric [name] is a 68 y.o. male who presents via audio/video conferencing for a telehealth visit today.  He has a h/o VF arrest, LBBB, and chronic systolic heart failure, s/p biv ICD insertion. He developed persistent atrial fib. 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 He required 3 additional procedures including the placement of a muscle flap and multiple wound vacs. He feels better but notes that his HR is on the fast side. Also, he notes that his BP has been a little high. The surgeons had cut back on his coreg. The patient is otherwise without complaint today.  The patient denies symptoms of fevers, chills, cough, or new SOB worrisome for COVID 19.  Past Medical History:  Diagnosis Date  . Acute blood loss anemia   . Acute encephalopathy   . Acute hypoxemic respiratory failure ([place])   . Acute idiopathic gout of right ankle   . Acute lumbar back pain   . Acute on chronic systolic heart failure ([place])   . Acute respiratory failure ([place])   . AKI (acute kidney injury) ([place])   . Altered mental status   . Aortic aneurysm without rupture ([place]) 02/09/2016  . Aortic root enlargement ([place]) 02/13/2012  . Aortic valve regurgitation, acquired 02/13/2012  . Arrhythmia   .  Atrial flutter ([place]) 03/18/2012  . Back pain   . Cardiac arrest ([place]) 02/01/2016  . CHF (congestive heart failure) ([place])   . Chronic anticoagulation 03/04/2013  . Chronic kidney disease    kidney fx studies increased   . Chronic lower back pain   . Chronic renal insufficiency   . Chronic systolic heart failure ([place]) 02/13/2012   Recent diagnosis 4 / 2013, LVEF 25% by Echo 12/2011  03/2013: Echo at [facility] Cardiology Conclusions: 1. Left ventricular ejection fraction estimated by 2D at 40-45 percent. 2. Mild concentric left ventricular hypertrophy. 3. Mild left atrial enlargement. 4. Moderate aortic valve regurgitation. 5. The aortic root at the sinus(es) of valsalva is moderately dilated 6. Mild mitral valve regurgitation. 7.  . CKD (chronic kidney disease)   . CKD (chronic kidney disease), stage IV ([place]) 08/06/2013   Creatinine 2.4 on 07/04/13   . Claustrophobia   . Colon cancer screening 03/04/2013  . Debility 02/22/2016  . Diabetes mellitus type 2 in nonobese (HCC)   . Diabetes mellitus type 2 in obese ([place])   . Dysrhythmia    "palpitations"  . Encounter for central line placement   . Exertional dyspnea 01/2012  . 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 HCAP (healthcare-associated pneumonia)   . Heart murmur   . Hyperlipidemia 02/13/2012  . Hypertension   . Hypothyroidism   . Internal hemorrhoids without mention of complication 0/25/8527  . Labile blood pressure   .  Left bundle branch block 02/13/2012  . Leg weakness, bilateral   . Long term (current) use of anticoagulants 08/06/2013   Eliquis therapy   . Long term current use of amiodarone 08/07/2016  . Lower extremity weakness   . Migraine 02/13/12   "opthalmic"  . Non-traumatic rhabdomyolysis   . Obesity (BMI 30-39.9) 02/13/2012  . Pain   . Paroxysmal atrial fibrillation (HCC)   . Pneumonia   . Retroperitoneal bleed   . Right ankle pain   . Right knee pain   . Severe aortic regurgitation 02/09/2016  . Special  screening for malignant neoplasms, colon 04/11/2013  . Thyroid activity decreased   . Tibial pain   . Varicose vein of leg    right  . Ventricular fibrillation ([place]) 02/09/2016  . Weakness of both lower extremities     Past Surgical History:  Procedure Laterality Date  . CARDIAC CATHETERIZATION N/A 02/17/2016   Procedure: Left Heart Cath and Coronary Angiography;  Surgeon: Jettie Booze, Eric [name];  Location: [facility];  Service: Cardiovascular;  Laterality: N/A;  . CARDIOVERSION  03/22/2012   Procedure: CARDIOVERSION;  Surgeon: Candee Furbish, Eric [name];  Location[facility] ENDOSCOPY;  Service: Cardiovascular;  Laterality: N/A;  . CARDIOVERSION  04/19/2012   Procedure: CARDIOVERSION;  Surgeon: Sinclair Grooms, Eric [name];  Location: [facility];  Service: Cardiovascular;  Laterality: N/A;  . COLONOSCOPY N/A 04/11/2013   Procedure: COLONOSCOPY;  Surgeon: Inda Castle, Eric [name];  Location: [facility] ENDOSCOPY;  Service: Endoscopy;  Laterality: N/A;  . COLONOSCOPY N/A 04/11/2013   Procedure: COLONOSCOPY;  Surgeon: Inda Castle, Eric [name];  Location: [facility] ENDOSCOPY;  Service: Endoscopy;  Laterality: N/A;  . EP IMPLANTABLE DEVICE N/A 02/17/2016   Procedure: BiV ICD Insertion CRT-D;  Surgeon: Evans Lance, Eric [name];  Location: [facility];  Service: Cardiovascular;  Laterality: N/A;  . FINGER SURGERY  2012   "4th digit right hand; thumb on left hand"  . RADIOLOGY WITH ANESTHESIA N/A 02/11/2016   Procedure: MRI OF THE BRAIN WITHOUT CONTRAST, LUMBAR WITHOUT CONTRAST;  Surgeon: Medication Radiologist, Eric [name];  Location: [facility];  Service: Radiology;  Laterality: N/A;  DR. WOOD/MRI  . RIGHT/LEFT HEART CATH AND CORONARY ANGIOGRAPHY N/A 07/26/2018   Procedure: RIGHT/LEFT HEART CATH AND CORONARY ANGIOGRAPHY;  Surgeon: Belva Crome, Eric [name];  Location: [facility];  Service: Cardiovascular;  Laterality: N/A;  . Skin melanocytoma excision  2012   "above left clavicle"  . TEE WITHOUT CARDIOVERSION  03/22/2012   Procedure: TRANSESOPHAGEAL  ECHOCARDIOGRAM (TEE);  Surgeon: Candee Furbish, Eric [name];  Location: [facility] ENDOSCOPY;  Service: Cardiovascular;  Laterality: N/A;  . TEE WITHOUT CARDIOVERSION N/A 02/08/2016   Procedure: TRANSESOPHAGEAL ECHOCARDIOGRAM (TEE);  Surgeon: Lelon Perla, Eric [name];  Location: [facility] ENDOSCOPY;  Service: Cardiovascular;  Laterality: N/A;    Current Outpatient Medications  Medication Sig Dispense Refill  . acetaminophen (TYLENOL) 500 MG tablet Take 1,500 mg by mouth every 4 (four) hours as needed for moderate pain or headache.     Marland Kitchen amiodarone (PACERONE) 200 MG tablet Take 200 mg by mouth daily.    Marland Kitchen apixaban (ELIQUIS) 5 MG TABS tablet Take 1 tablet (5 mg total) by mouth 2 (two) times daily. 60 tablet 11  . aspirin 81 MG chewable tablet Chew 81 mg by mouth daily.    . carvedilol (COREG) 3.125 MG tablet Take 3.125 mg by mouth 2 (two) times daily with a meal.    . cefTRIAXone (ROCEPHIN) 10 g injection     . cefTRIAXone (  ROCEPHIN) IVPB Inject 2 g into the vein daily. Wound infection    . docusate sodium (COLACE) 100 MG capsule Take 100 mg by mouth daily as needed for mild constipation.    . fluticasone (FLONASE) 50 MCG/ACT nasal spray Place 2 sprays into the nose as needed for allergies.     . furosemide (LASIX) 40 MG tablet Take 40 mg by mouth.    . Hypromellose (ARTIFICIAL TEARS OP) Place 1 drop into both eyes daily as needed (dry eyes).    Marland Kitchen ipratropium (ATROVENT) 0.06 % nasal spray Place 2 sprays into both nostrils 4 (four) times daily.    Marland Kitchen levothyroxine (SYNTHROID, LEVOTHROID) 75 MCG tablet Take 1 tablet (75 mcg total) by mouth See admin instructions. Take 75 mcg daily in the morning except on Sundays take 37.5 mcg in the morning 90 tablet 1  . oxyCODONE (OXY IR/ROXICODONE) 5 MG immediate release tablet Take by mouth as needed.    . rosuvastatin (CRESTOR) 20 MG tablet TAKE 1 TABLET BY MOUTH DAILY 90 tablet 3  . vitamin B-12 (CYANOCOBALAMIN) 100 MCG tablet Take 100 mcg by mouth daily.     . vitamin C (ASCORBIC ACID)  500 MG tablet Take 500 mg by mouth daily.      No current facility-administered medications for this visit.     Allergies:   Lactose intolerance (gi)   Social History:  The patient  reports that he has never smoked. He has never used smokeless tobacco. He reports current alcohol use. He reports that he does not use drugs.   Family History:  The patient's family history includes Diabetes in his mother; Heart disease in his father and mother; Heart failure in his father and mother; Hypertension in his father and mother.   ROS:  Please see the history of present illness.   All other systems are personally reviewed and negative.    Exam:    Vital Signs:  There were no vitals taken for this visit.  Well appearing, alert and conversant, regular work of breathing,  good skin color Eyes- anicteric, neuro- grossly intact, skin- no apparent rash or lesions or cyanosis, mouth- oral mucosa is pink   Labs/Other Tests and Data Reviewed:    Recent Labs: 07/22/2018: Hemoglobin 11.9; Platelets 263 10/09/2018: TSH 3.020 12/05/2018: NT-Pro BNP 4,691 12/13/2018: BUN 40; Creatinine, Ser 2.75; Potassium 4.9; Sodium 133   Wt Readings from Last 3 Encounters:  01/17/19 223 lb (101.2 kg)  12/05/18 232 lb (105.2 kg)  12/05/18 232 lb (105.2 kg)     Other studies personally reviewed:  Last device remote is reviewed from [facility] PDF dated 01/23/19 which reveals normal device function, no arrhythmias except atrial fib.   ASSESSMENT & PLAN:    1.  Persistent atrial fib - he has tolerated this. I have recommended that once he heals up, we try DCCV. 2. VF arrest - he has had no additional episodes. 3. AVR/wound infection - he appears to be healing and has almost finished IV anti-biotics. 4. Chronic systolic heart failure - his pacing rate is set to 90/min. I have recommended he return to the office in the next couple of weeks to be seen by me so that we can turn down his rates. 5. HTN - his blood pressure  is a little high. I have asked him to increase the coreg to 6.25 bid. 6. COVID 19 screen The patient denies symptoms of COVID 19 at this time.  The importance of social distancing was discussed today.  Follow-up:  In the next few weeks for ICD reprogramming Next remote: 8/20  Current medicines are reviewed at length with the patient today.   The patient does not have concerns regarding his medicines.  The following changes were made today:  none  Labs/ tests ordered today include: none No orders of the defined types were placed in this encounter.    Patient Risk:  after full review of this patients clinical status, I feel that they are at moderate risk at this time.  Today, I have spent 25 minutes with the patient with telehealth technology discussing all of the above.    Signed, Cristopher Peru, Eric [name]  02/11/2019 2:02 PM     [facility] [address] [place] [address] [phone] (office) [phone] (fax)

## 2019-02-12 ENCOUNTER — Telehealth: Payer: Self-pay | Admitting: Internal Medicine

## 2019-02-12 DIAGNOSIS — I130 Hypertensive heart and chronic kidney disease with heart failure and stage 1 through stage 4 chronic kidney disease, or unspecified chronic kidney disease: Secondary | ICD-10-CM | POA: Diagnosis not present

## 2019-02-12 DIAGNOSIS — T8131XA Disruption of external operation (surgical) wound, not elsewhere classified, initial encounter: Secondary | ICD-10-CM | POA: Diagnosis not present

## 2019-02-12 DIAGNOSIS — T8141XA Infection following a procedure, superficial incisional surgical site, initial encounter: Secondary | ICD-10-CM | POA: Diagnosis not present

## 2019-02-12 DIAGNOSIS — B957 Other staphylococcus as the cause of diseases classified elsewhere: Secondary | ICD-10-CM | POA: Diagnosis not present

## 2019-02-12 DIAGNOSIS — I5022 Chronic systolic (congestive) heart failure: Secondary | ICD-10-CM | POA: Diagnosis not present

## 2019-02-12 DIAGNOSIS — B961 Klebsiella pneumoniae [K. pneumoniae] as the cause of diseases classified elsewhere: Secondary | ICD-10-CM | POA: Diagnosis not present

## 2019-02-12 MED ORDER — CARVEDILOL 6.25 MG PO TABS: 6.2500 mg | ORAL_TABLET | Freq: Two times a day (BID) | ORAL | 3 refills | Status: DC

## 2019-02-12 NOTE — Telephone Encounter (Signed)
Pt calling requesting a refill on carvedilol, stating this medication was increased to 6.125 mg tablet. This change was not sent in or changed on pt medication list. Please address

## 2019-02-12 NOTE — Telephone Encounter (Signed)
Pt medication list updated and sent to pharmacy as requested.

## 2019-02-12 NOTE — Telephone Encounter (Signed)
° ° °  Patient states dosage was changed during televisit.   Pt c/o medication issue:  1. Name of Medication:carvedilol (COREG) 3.125 MG tablet  2. How are you currently taking this medication (dosage and times per day)? n/a  3. Are you having a reaction (difficulty breathing--STAT)? no  4. What is your medication issue? Clarify dosage, Patient states dosage was increased. Please refill to [facility], 30 day suppy

## 2019-02-13 DIAGNOSIS — T8149XA Infection following a procedure, other surgical site, initial encounter: Secondary | ICD-10-CM | POA: Diagnosis not present

## 2019-02-14 ENCOUNTER — Telehealth: Payer: Self-pay | Admitting: Internal Medicine

## 2019-02-14 NOTE — Telephone Encounter (Signed)
Patient is calling back again regarding medication changes.

## 2019-02-14 NOTE — Telephone Encounter (Signed)
Follow up    Patient is calling in reference to his medication.

## 2019-02-15 ENCOUNTER — Telehealth: Payer: Self-pay | Admitting: Interventional Cardiology

## 2019-02-15 NOTE — Telephone Encounter (Signed)
   I spoke to [name] who was notified by [facility] that his potassium level was low.  He also reports that after seeing Dr. Lovena Le he was instructed to increase carvedilol to the next higher dose but a new prescription was not sent.  Since the recent valve operation he was on 3.125 mg twice daily.  Prior to surgery he had been on 25 mg twice daily.  I have instructed him to take 2 of the 3.125 mg tablets twice daily and that a new prescription for 6.25 mg twice daily will be sent.  Also noted that potassium was 3.1 on blood work from 01/30/2019.  Resume potassium by starting 20 mEq p.o. daily.  Also notes lower extremity swelling.  Will increase furosemide from 40 to 60 mg daily.  He will have blood work performed on Monday or Tuesday and we will asked that the laboratory work be faxed to [place] so that we can immediately manage his potassium.   Fax [phone]  Anderson [place] please contact [name] with the correct fax number if the one I gave him above is incorrect. Therefore changes made today are: Carvedilol 6.25 mg twice daily; furosemide 60 mg/day; potassium chloride elixir 20 mEq per 15 cc, 1 Tablespoon daily.

## 2019-02-18 DIAGNOSIS — B961 Klebsiella pneumoniae [K. pneumoniae] as the cause of diseases classified elsewhere: Secondary | ICD-10-CM | POA: Diagnosis not present

## 2019-02-18 DIAGNOSIS — T8131XA Disruption of external operation (surgical) wound, not elsewhere classified, initial encounter: Secondary | ICD-10-CM | POA: Diagnosis not present

## 2019-02-18 DIAGNOSIS — I5022 Chronic systolic (congestive) heart failure: Secondary | ICD-10-CM | POA: Diagnosis not present

## 2019-02-18 DIAGNOSIS — I130 Hypertensive heart and chronic kidney disease with heart failure and stage 1 through stage 4 chronic kidney disease, or unspecified chronic kidney disease: Secondary | ICD-10-CM | POA: Diagnosis not present

## 2019-02-18 DIAGNOSIS — B957 Other staphylococcus as the cause of diseases classified elsewhere: Secondary | ICD-10-CM | POA: Diagnosis not present

## 2019-02-18 DIAGNOSIS — T8141XA Infection following a procedure, superficial incisional surgical site, initial encounter: Secondary | ICD-10-CM | POA: Diagnosis not present

## 2019-02-18 MED ORDER — POTASSIUM CHLORIDE 20 MEQ/15ML (10%) PO SOLN: 20.0000 meq | Freq: Every day | ORAL | 6 refills | Status: DC

## 2019-02-18 MED ORDER — FUROSEMIDE 40 MG PO TABS: 60.0000 mg | ORAL_TABLET | Freq: Every day | ORAL | 3 refills | Status: DC

## 2019-02-18 NOTE — Telephone Encounter (Signed)
Dr. Tamala Julian spoke with Pt and made medication changes.

## 2019-02-18 NOTE — Telephone Encounter (Signed)
Spoke with pt and gave him correct fax number [phone].  Advised I have sent in prescriptions for Furosemide and K+.  Advised Carvedilol was sent in 5/20.  Pt verbalized understanding and was appreciative for call.

## 2019-02-20 ENCOUNTER — Telehealth: Payer: Self-pay

## 2019-02-20 NOTE — Telephone Encounter (Signed)
pT SCHEDULED FOR June 4

## 2019-02-26 ENCOUNTER — Telehealth: Payer: Self-pay

## 2019-02-26 NOTE — Telephone Encounter (Signed)
Spoke with pt regarding covid-19 screening prior to appt. Pt stated he has not been in contact with anyone who may have covid-19 and has no symptoms.

## 2019-02-27 ENCOUNTER — Other Ambulatory Visit: Payer: Self-pay

## 2019-02-27 ENCOUNTER — Encounter: Payer: Self-pay | Admitting: Internal Medicine

## 2019-02-27 ENCOUNTER — Ambulatory Visit (INDEPENDENT_AMBULATORY_CARE_PROVIDER_SITE_OTHER): Payer: Medicare Other | Admitting: Internal Medicine

## 2019-02-27 VITALS — BP 132/88 | HR 90 | Ht 69.0 in | Wt 220.8 lb

## 2019-02-27 DIAGNOSIS — I5022 Chronic systolic (congestive) heart failure: Secondary | ICD-10-CM | POA: Diagnosis not present

## 2019-02-27 DIAGNOSIS — Z95810 Presence of automatic (implantable) cardiac defibrillator: Secondary | ICD-10-CM | POA: Diagnosis not present

## 2019-02-27 LAB — CUP PACEART INCLINIC DEVICE CHECK
Battery Remaining Longevity: 48 mo
Brady Statistic RA Percent Paced: 15 %
Brady Statistic RV Percent Paced: 90 %
Date Time Interrogation Session: 20200604162620
HighPow Impedance: 58.5 Ohm
Implantable Lead Implant Date: 20170525
Implantable Lead Implant Date: 20170525
Implantable Lead Implant Date: 20170525
Implantable Lead Location: 753858
Implantable Lead Location: 753859
Implantable Lead Location: 753860
Implantable Lead Model: 7122
Implantable Pulse Generator Implant Date: 20170525
Lead Channel Impedance Value: 375 Ohm
Lead Channel Impedance Value: 387.5 Ohm
Lead Channel Impedance Value: 562.5 Ohm
Lead Channel Pacing Threshold Amplitude: 0.5 V
Lead Channel Pacing Threshold Amplitude: 0.5 V
Lead Channel Pacing Threshold Amplitude: 1.5 V
Lead Channel Pacing Threshold Amplitude: 1.5 V
Lead Channel Pacing Threshold Pulse Width: 0.5 ms
Lead Channel Pacing Threshold Pulse Width: 0.5 ms
Lead Channel Pacing Threshold Pulse Width: 0.5 ms
Lead Channel Pacing Threshold Pulse Width: 0.5 ms
Lead Channel Sensing Intrinsic Amplitude: 0.5 mV
Lead Channel Sensing Intrinsic Amplitude: 11.4 mV
Lead Channel Setting Pacing Amplitude: 2 V
Lead Channel Setting Pacing Amplitude: 2.5 V
Lead Channel Setting Pacing Pulse Width: 0.5 ms
Lead Channel Setting Pacing Pulse Width: 0.5 ms
Lead Channel Setting Sensing Sensitivity: 0.5 mV
Pulse Gen Serial Number: 7357926

## 2019-02-27 NOTE — Progress Notes (Signed)
HPI Eric [name] returns today for reprogramming of his ICD and ongoing followup of atrial fib, VT, and chronic systolic heart failure. He has recently undergone AVR. His procedure was complicated by a sternal wound infection for which he underwent 3 incision revisions. He denies fever or chills. No other complaints today.  Allergies  Allergen Reactions  . Lactose Intolerance (Gi) Diarrhea     Current Outpatient Medications  Medication Sig Dispense Refill  . acetaminophen (TYLENOL) 500 MG tablet Take 1,500 mg by mouth every 4 (four) hours as needed for moderate pain or headache.     Marland Kitchen amiodarone (PACERONE) 200 MG tablet Take 200 mg by mouth daily.    Marland Kitchen apixaban (ELIQUIS) 5 MG TABS tablet Take 1 tablet (5 mg total) by mouth 2 (two) times daily. 60 tablet 11  . aspirin 81 MG chewable tablet Chew 81 mg by mouth daily.    . carvedilol (COREG) 6.25 MG tablet Take 1 tablet (6.25 mg total) by mouth 2 (two) times daily. 180 tablet 3  . cefTRIAXone (ROCEPHIN) 10 g injection     . cefTRIAXone (ROCEPHIN) IVPB Inject 2 g into the vein daily. Wound infection    . docusate sodium (COLACE) 100 MG capsule Take 100 mg by mouth daily as needed for mild constipation.    . fluticasone (FLONASE) 50 MCG/ACT nasal spray Place 2 sprays into the nose as needed for allergies.     . furosemide (LASIX) 40 MG tablet Take 1.5 tablets (60 mg total) by mouth daily. 135 tablet 3  . Hypromellose (ARTIFICIAL TEARS OP) Place 1 drop into both eyes daily as needed (dry eyes).    Marland Kitchen ipratropium (ATROVENT) 0.06 % nasal spray Place 2 sprays into both nostrils 4 (four) times daily.    Marland Kitchen levothyroxine (SYNTHROID, LEVOTHROID) 75 MCG tablet Take 1 tablet (75 mcg total) by mouth See admin instructions. Take 75 mcg daily in the morning except on Sundays take 37.5 mcg in the morning 90 tablet 1  . oxyCODONE (OXY IR/ROXICODONE) 5 MG immediate release tablet Take by mouth as needed.    . potassium chloride 20 MEQ/15ML (10%) SOLN  Take 15 mLs (20 mEq total) by mouth daily. 473 mL 6  . rosuvastatin (CRESTOR) 20 MG tablet TAKE 1 TABLET BY MOUTH DAILY 90 tablet 3  . vitamin B-12 (CYANOCOBALAMIN) 100 MCG tablet Take 100 mcg by mouth daily.     . vitamin C (ASCORBIC ACID) 500 MG tablet Take 500 mg by mouth daily.      No current facility-administered medications for this visit.      Past Medical History:  Diagnosis Date  . Acute blood loss anemia   . Acute encephalopathy   . Acute hypoxemic respiratory failure ([place])   . Acute idiopathic gout of right ankle   . Acute lumbar back pain   . Acute on chronic systolic heart failure ([place])   . Acute respiratory failure ([place])   . AKI (acute kidney injury) ([place])   . Altered mental status   . Aortic aneurysm without rupture ([place]) 02/09/2016  . Aortic root enlargement ([place]) 02/13/2012  . Aortic valve regurgitation, acquired 02/13/2012  . Arrhythmia   . Atrial flutter ([place]) 03/18/2012  . Back pain   . Cardiac arrest ([place]) 02/01/2016  . CHF (congestive heart failure) ([place])   . Chronic anticoagulation 03/04/2013  . Chronic kidney disease    kidney fx studies increased   . Chronic lower back pain   . Chronic renal  insufficiency   . Chronic systolic heart failure ([place]) 02/13/2012   Recent diagnosis 4 / 2013, LVEF 25% by Echo 12/2011  03/2013: Echo at [facility] Cardiology Conclusions: 1. Left ventricular ejection fraction estimated by 2D at 40-45 percent. 2. Mild concentric left ventricular hypertrophy. 3. Mild left atrial enlargement. 4. Moderate aortic valve regurgitation. 5. The aortic root at the sinus(es) of valsalva is moderately dilated 6. Mild mitral valve regurgitation. 7.  . CKD (chronic kidney disease)   . CKD (chronic kidney disease), stage IV ([place]) 08/06/2013   Creatinine 2.4 on 07/04/13   . Claustrophobia   . Colon cancer screening 03/04/2013  . Debility 02/22/2016  . Diabetes mellitus type 2 in nonobese (HCC)   . Diabetes mellitus type 2 in obese ([place])   . Dysrhythmia     "palpitations"  . Encounter for central line placement   . Exertional dyspnea 01/2012  . 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 HCAP (healthcare-associated pneumonia)   . Heart murmur   . Hyperlipidemia 02/13/2012  . Hypertension   . Hypothyroidism   . Internal hemorrhoids without mention of complication 12/06/9700  . Labile blood pressure   . Left bundle branch block 02/13/2012  . Leg weakness, bilateral   . Long term (current) use of anticoagulants 08/06/2013   Eliquis therapy   . Long term current use of amiodarone 08/07/2016  . Lower extremity weakness   . Migraine 02/13/12   "opthalmic"  . Non-traumatic rhabdomyolysis   . Obesity (BMI 30-39.9) 02/13/2012  . Pain   . Paroxysmal atrial fibrillation (HCC)   . Pneumonia   . Retroperitoneal bleed   . Right ankle pain   . Right knee pain   . Severe aortic regurgitation 02/09/2016  . Special screening for malignant neoplasms, colon 04/11/2013  . Thyroid activity decreased   . Tibial pain   . Varicose vein of leg    right  . Ventricular fibrillation ([place]) 02/09/2016  . Weakness of both lower extremities     ROS:   All systems reviewed and negative except as noted in the HPI.   Past Surgical History:  Procedure Laterality Date  . CARDIAC CATHETERIZATION N/A 02/17/2016   Procedure: Left Heart Cath and Coronary Angiography;  Surgeon: Jettie Booze, MD;  Location: [place] CV LAB;  Service: Cardiovascular;  Laterality: N/A;  . CARDIOVERSION  03/22/2012   Procedure: CARDIOVERSION;  Surgeon: Candee Furbish, MD;  Location: [facility] ENDOSCOPY;  Service: Cardiovascular;  Laterality: N/A;  . CARDIOVERSION  04/19/2012   Procedure: CARDIOVERSION;  Surgeon: Sinclair Grooms, MD;  Location: [place];  Service: Cardiovascular;  Laterality: N/A;  . COLONOSCOPY N/A 04/11/2013   Procedure: COLONOSCOPY;  Surgeon: Inda Castle, MD;  Location: [facility] ENDOSCOPY;  Service: Endoscopy;  Laterality: N/A;  . COLONOSCOPY N/A 04/11/2013    Procedure: COLONOSCOPY;  Surgeon: Inda Castle, MD;  Location: [facility] ENDOSCOPY;  Service: Endoscopy;  Laterality: N/A;  . EP IMPLANTABLE DEVICE N/A 02/17/2016   Procedure: BiV ICD Insertion CRT-D;  Surgeon: Evans Lance, MD;  Location: [place] CV LAB;  Service: Cardiovascular;  Laterality: N/A;  . FINGER SURGERY  2012   "4th digit right hand; thumb on left hand"  . RADIOLOGY WITH ANESTHESIA N/A 02/11/2016   Procedure: MRI OF THE BRAIN WITHOUT CONTRAST, LUMBAR WITHOUT CONTRAST;  Surgeon: Medication Radiologist, MD;  Location: [place];  Service: Radiology;  Laterality: N/A;  DR. WOOD/MRI  . RIGHT/LEFT HEART CATH AND CORONARY ANGIOGRAPHY N/A 07/26/2018  Procedure: RIGHT/LEFT HEART CATH AND CORONARY ANGIOGRAPHY;  Surgeon: Belva Crome, MD;  Location: [employer] CV LAB;  Service: Cardiovascular;  Laterality: N/A;  . Skin melanocytoma excision  2012   "above left clavicle"  . TEE WITHOUT CARDIOVERSION  03/22/2012   Procedure: TRANSESOPHAGEAL ECHOCARDIOGRAM (TEE);  Surgeon: Candee Furbish, MD;  Location: [facility] ENDOSCOPY;  Service: Cardiovascular;  Laterality: N/A;  . TEE WITHOUT CARDIOVERSION N/A 02/08/2016   Procedure: TRANSESOPHAGEAL ECHOCARDIOGRAM (TEE);  Surgeon: Lelon Perla, MD;  Location: [facility] ENDOSCOPY;  Service: Cardiovascular;  Laterality: N/A;     Family History  Problem Relation Age of Onset  . Hypertension Mother   . Heart disease Mother   . Heart failure Mother   . Diabetes Mother   . Hypertension Father   . Heart disease Father   . Heart failure Father      Social History   Socioeconomic History  . Marital status: Married    Spouse name: [name]  . Number of children: 1  . Years of education: Not on file  . Highest education level: Not on file  Occupational History    Employer: [employer]  Social Needs  . Financial resource strain: Not on file  . Food insecurity:    Worry: Not on file    Inability: Not on file  . Transportation needs:    Medical: Not on  file    Non-medical: Not on file  Tobacco Use  . Smoking status: Never Smoker  . Smokeless tobacco: Never Used  Substance and Sexual Activity  . Alcohol use: Yes    Comment: 02/13/12 "socially"  . Drug use: No  . Sexual activity: Not Currently  Lifestyle  . Physical activity:    Days per week: Not on file    Minutes per session: Not on file  . Stress: Not on file  Relationships  . Social connections:    Talks on phone: Not on file    Gets together: Not on file    Attends religious service: Not on file    Active member of club or organization: Not on file    Attends meetings of clubs or organizations: Not on file    Relationship status: Not on file  . Intimate partner violence:    Fear of current or ex partner: Not on file    Emotionally abused: Not on file    Physically abused: Not on file    Forced sexual activity: Not on file  Other Topics Concern  . 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     BP 132/88   Pulse 90   Ht 5\' 9"  (1.753 m)   Wt 220 lb 12.8 oz (100.2 kg)   SpO2 95%   BMI 32.61 kg/m   Physical Exam:  Well appearing NAD HEENT: Unremarkable Neck:  No JVD, no thyromegally Lymphatics:  No adenopathy Back:  No CVA tenderness Lungs:  Clear HEART:  Regular rate rhythm, no murmurs, no rubs, no clicks Abd:  soft, positive bowel sounds, no organomegally, no rebound, no guarding Ext:  2 plus pulses, no edema, no cyanosis, no clubbing Skin:  No rashes no nodules Neuro:  CN II through XII intact, motor grossly intact  EKG - atrial fib/flutter with a  RVR  DEVICE  Normal device function.  See PaceArt for details.   Assess/Plan: 1. Atrial fib/flutter - I have recommended DCCV. He has  already had a couple albeit off of amiodarone DCCV's. He has been on amiodarone for a while. He will call us if he wishes to proceed with DCCV. 2. Chronic systolic heart failure - his symptoms are  class 2. He will continue his current meds. 3. ICD - his [employer] Jude DDD ICD is working normally. We turned his outputs down to 70 today.  4. VT/VF - he is s/p ICD and has had no recurrent ventricular arrhythmias.  Eric [name].D.

## 2019-02-27 NOTE — Patient Instructions (Addendum)
Medication Instructions:  Your physician recommends that you continue on your current medications as directed. Please refer to the Current Medication list given to you today.  Labwork: None ordered.  Testing/Procedures: None ordered.  Follow-Up: Your physician wants you to follow-up in: 3 months with Dr. Lovena Le.      Remote monitoring is used to monitor your ICD from home. This monitoring reduces the number of office visits required to check your device to one time per year. It allows [place] to keep an eye on the functioning of your device to ensure it is working properly. You are scheduled for a device check from home on 04/23/2019. You may send your transmission at any time that day. If you have a wireless device, the transmission will be sent automatically. After your physician reviews your transmission, you will receive a postcard with your next transmission date.  Any Other Special Instructions Will Be Listed Below (If Applicable).  If you need a refill on your cardiac medications before your next appointment, please call your pharmacy.

## 2019-03-04 ENCOUNTER — Telehealth: Payer: Self-pay

## 2019-03-04 NOTE — Telephone Encounter (Signed)
Called pt to go over [place] screening questions. No answer.

## 2019-03-06 ENCOUNTER — Ambulatory Visit: Payer: Medicare Other | Admitting: Interventional Cardiology

## 2019-03-06 NOTE — Progress Notes (Signed)
No show

## 2019-03-07 ENCOUNTER — Telehealth: Payer: Self-pay | Admitting: Interventional Cardiology

## 2019-03-07 ENCOUNTER — Other Ambulatory Visit: Payer: Self-pay | Admitting: Interventional Cardiology

## 2019-03-07 DIAGNOSIS — I5022 Chronic systolic (congestive) heart failure: Secondary | ICD-10-CM

## 2019-03-07 MED ORDER — POTASSIUM CHLORIDE 20 MEQ/15ML (10%) PO SOLN: 20.0000 meq | Freq: Every day | ORAL | 6 refills | Status: DC

## 2019-03-07 NOTE — Telephone Encounter (Signed)
   Basic metabolic panel Monday 8/47/30. Dx: CHF and CKD  Refill Potassium chloride 20 meq/15 cc--> 20 meq daily.

## 2019-03-07 NOTE — Telephone Encounter (Signed)
Lab scheduled and refill sent.

## 2019-03-08 ENCOUNTER — Encounter: Payer: Self-pay | Admitting: Interventional Cardiology

## 2019-03-10 ENCOUNTER — Other Ambulatory Visit: Payer: Medicare Other | Admitting: *Deleted

## 2019-03-10 ENCOUNTER — Other Ambulatory Visit: Payer: Self-pay

## 2019-03-10 DIAGNOSIS — I5022 Chronic systolic (congestive) heart failure: Secondary | ICD-10-CM | POA: Diagnosis not present

## 2019-03-11 LAB — BASIC METABOLIC PANEL
BUN/Creatinine Ratio: 12 (ref 10–24)
BUN: 27 mg/dL (ref 8–27)
CO2: 19 mmol/L — ABNORMAL LOW (ref 20–29)
Calcium: 8.9 mg/dL (ref 8.6–10.2)
Chloride: 102 mmol/L (ref 96–106)
Creatinine, Ser: 2.2 mg/dL — ABNORMAL HIGH (ref 0.76–1.27)
GFR calc Af Amer: 35 mL/min/{1.73_m2} — ABNORMAL LOW (ref >59)
GFR calc non Af Amer: 30 mL/min/{1.73_m2} — ABNORMAL LOW (ref >59)
Glucose: 135 mg/dL — ABNORMAL HIGH (ref 65–99)
Potassium: 4.1 mmol/L (ref 3.5–5.2)
Sodium: 137 mmol/L (ref 134–144)

## 2019-03-22 ENCOUNTER — Encounter: Payer: Self-pay | Admitting: Interventional Cardiology

## 2019-04-09 ENCOUNTER — Ambulatory Visit (INDEPENDENT_AMBULATORY_CARE_PROVIDER_SITE_OTHER): Payer: Medicare Other

## 2019-04-09 ENCOUNTER — Other Ambulatory Visit: Payer: Self-pay

## 2019-04-09 VITALS — Ht 69.0 in | Wt 223.0 lb

## 2019-04-09 DIAGNOSIS — Z0000 Encounter for general adult medical examination without abnormal findings: Secondary | ICD-10-CM

## 2019-04-09 NOTE — Patient Instructions (Signed)
Eric [name] , Thank you for taking time to come for your Medicare Wellness Visit. I appreciate your ongoing commitment to your health goals. Please review the following plan we discussed and let me know if I can assist you in the future.   Screening recommendations/referrals: Colonoscopy: 03/2013 Recommended yearly ophthalmology/optometry visit for glaucoma screening and checkup Recommended yearly dental visit for hygiene and checkup  Vaccinations: Influenza vaccine: 04/2018 Pneumococcal vaccine: not sure if had Tdap vaccine: 02/2017 Shingles vaccine: discussed    Advanced directives: Advance directive discussed with you today.   Conditions/risks identified: obesity  Next appointment: 05/02/2019 at 11:00  Preventive Care 67 Years and Older, Male Preventive care refers to lifestyle choices and visits with your health care provider that can promote health and wellness. What does preventive care include?  A yearly physical exam. This is also called an annual well check.  Dental exams once or twice a year.  Routine eye exams. Ask your health care provider how often you should have your eyes checked.  Personal lifestyle choices, including:  Daily care of your teeth and gums.  Regular physical activity.  Eating a healthy diet.  Avoiding tobacco and drug use.  Limiting alcohol use.  Practicing safe sex.  Taking low doses of aspirin every day.  Taking vitamin and mineral supplements as recommended by your health care provider. What happens during an annual well check? The services and screenings done by your health care provider during your annual well check will depend on your age, overall health, lifestyle risk factors, and family history of disease. Counseling  Your health care provider may ask you questions about your:  Alcohol use.  Tobacco use.  Drug use.  Emotional well-being.  Home and relationship well-being.  Sexual activity.  Eating habits.  History of  falls.  Memory and ability to understand (cognition).  Work and work Statistician. Screening  You may have the following tests or measurements:  Height, weight, and BMI.  Blood pressure.  Lipid and cholesterol levels. These may be checked every 5 years, or more frequently if you are over 67 years old.  Skin check.  Lung cancer screening. You may have this screening every year starting at age 45 if you have a 30-pack-year history of smoking and currently smoke or have quit within the past 15 years.  Fecal occult blood test (FOBT) of the stool. You may have this test every year starting at age 67.  Flexible sigmoidoscopy or colonoscopy. You may have a sigmoidoscopy every 5 years or a colonoscopy every 10 years starting at age 45.  Prostate cancer screening. Recommendations will vary depending on your family history and other risks.  Hepatitis C blood test.  Hepatitis B blood test.  Sexually transmitted disease (STD) testing.  Diabetes screening. This is done by checking your blood sugar (glucose) after you have not eaten for a while (fasting). You may have this done every 1-3 years.  Abdominal aortic aneurysm (AAA) screening. You may need this if you are a current or former smoker.  Osteoporosis. You may be screened starting at age 43 if you are at high risk. Talk with your health care provider about your test results, treatment options, and if necessary, the need for more tests. Vaccines  Your health care provider may recommend certain vaccines, such as:  Influenza vaccine. This is recommended every year.  Tetanus, diphtheria, and acellular pertussis (Tdap, Td) vaccine. You may need a Td booster every 10 years.  Zoster vaccine. You may need this  after age 67.  Pneumococcal 13-valent conjugate (PCV13) vaccine. One dose is recommended after age 67.  Pneumococcal polysaccharide (PPSV23) vaccine. One dose is recommended after age 67. Talk to your health care provider about  which screenings and vaccines you need and how often you need them. This information is not intended to replace advice given to you by your health care provider. Make sure you discuss any questions you have with your health care provider. Document Released: 10/08/2015 Document Revised: 05/31/2016 Document Reviewed: 07/13/2015 Elsevier Interactive Patient Education  2017 [place] Prevention in the Home Falls can cause injuries. They can happen to people of all ages. There are many things you can do to make your home safe and to help prevent falls. What can I do on the outside of my home?  Regularly fix the edges of walkways and driveways and fix any cracks.  Remove anything that might make you trip as you walk through a door, such as a raised step or threshold.  Trim any bushes or trees on the path to your home.  Use bright outdoor lighting.  Clear any walking paths of anything that might make someone trip, such as rocks or tools.  Regularly check to see if handrails are loose or broken. Make sure that both sides of any steps have handrails.  Any raised decks and porches should have guardrails on the edges.  Have any leaves, snow, or ice cleared regularly.  Use sand or salt on walking paths during winter.  Clean up any spills in your garage right away. This includes oil or grease spills. What can I do in the bathroom?  Use night lights.  Install grab bars by the toilet and in the tub and shower. Do not use towel bars as grab bars.  Use non-skid mats or decals in the tub or shower.  If you need to sit down in the shower, use a plastic, non-slip stool.  Keep the floor dry. Clean up any water that spills on the floor as soon as it happens.  Remove soap buildup in the tub or shower regularly.  Attach bath mats securely with double-sided non-slip rug tape.  Do not have throw rugs and other things on the floor that can make you trip. What can I do in the bedroom?   Use night lights.  Make sure that you have a light by your bed that is easy to reach.  Do not use any sheets or blankets that are too big for your bed. They should not hang down onto the floor.  Have a firm chair that has side arms. You can use this for support while you get dressed.  Do not have throw rugs and other things on the floor that can make you trip. What can I do in the kitchen?  Clean up any spills right away.  Avoid walking on wet floors.  Keep items that you use a lot in easy-to-reach places.  If you need to reach something above you, use a strong step stool that has a grab bar.  Keep electrical cords out of the way.  Do not use floor polish or wax that makes floors slippery. If you must use wax, use non-skid floor wax.  Do not have throw rugs and other things on the floor that can make you trip. What can I do with my stairs?  Do not leave any items on the stairs.  Make sure that there are handrails on both sides of the  stairs and use them. Fix handrails that are broken or loose. Make sure that handrails are as long as the stairways.  Check any carpeting to make sure that it is firmly attached to the stairs. Fix any carpet that is loose or worn.  Avoid having throw rugs at the top or bottom of the stairs. If you do have throw rugs, attach them to the floor with carpet tape.  Make sure that you have a light switch at the top of the stairs and the bottom of the stairs. If you do not have them, ask someone to add them for you. What else can I do to help prevent falls?  Wear shoes that:  Do not have high heels.  Have rubber bottoms.  Are comfortable and fit you well.  Are closed at the toe. Do not wear sandals.  If you use a stepladder:  Make sure that it is fully opened. Do not climb a closed stepladder.  Make sure that both sides of the stepladder are locked into place.  Ask someone to hold it for you, if possible.  Clearly mark and make sure that  you can see:  Any grab bars or handrails.  First and last steps.  Where the edge of each step is.  Use tools that help you move around (mobility aids) if they are needed. These include:  Canes.  Walkers.  Scooters.  Crutches.  Turn on the lights when you go into a dark area. Replace any light bulbs as soon as they burn out.  Set up your furniture so you have a clear path. Avoid moving your furniture around.  If any of your floors are uneven, fix them.  If there are any pets around you, be aware of where they are.  Review your medicines with your doctor. Some medicines can make you feel dizzy. This can increase your chance of falling. Ask your doctor what other things that you can do to help prevent falls. This information is not intended to replace advice given to you by your health care provider. Make sure you discuss any questions you have with your health care provider. Document Released: 07/08/2009 Document Revised: 02/17/2016 Document Reviewed: 10/16/2014 Elsevier Interactive Patient Education  2017 [employer].

## 2019-04-09 NOTE — Progress Notes (Signed)
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  This visit type was conducted due to national recommendations for restrictions regarding the COVID-19 Pandemic (e.g. social distancing). This format is felt to be most appropriate for this patient at this time. All issues noted in this document were discussed and addressed. No physical exam was performed (except for noted visual exam findings with Video Visits). 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 Vital signs may be absent or patient reported.  Patient location:  At home  Nurse location:  [place] office     Review of Systems  n/a Cardiac Risk Factors include: advanced age (>69men, >73 women);hypertension;male gender    Objective:    Today's Vitals   04/09/19 1452  Weight: 223 lb (101.2 kg)  Height: 5\' 9"  (1.753 m)   Body mass index is 32.93 kg/m.  Advanced Directives 04/09/2019 07/26/2018 04/26/2016 03/23/2016 02/22/2016 02/08/2016 04/11/2013  Does Patient Have a Medical Advance Directive? No No No No No No Patient does not have advance directive  Would patient like information on creating a medical advance directive? - Yes (MAU/Ambulatory/Procedural Areas - Information given) Yes - Educational materials given - Yes - Educational materials given - -  Pre-existing out of facility DNR order (yellow form or pink MOST form) - - - - - - No    Current Medications (verified) Outpatient Encounter Medications as of 04/09/2019  Medication Sig  . acetaminophen (TYLENOL) 500 MG tablet Take 1,500 mg by mouth every 4 (four) hours as needed for moderate pain or headache.   Marland Kitchen amiodarone (PACERONE) 200 MG tablet Take 200 mg by mouth daily.  Marland Kitchen apixaban (ELIQUIS) 5 MG TABS tablet Take 1 tablet (5 mg total) by mouth 2 (two) times daily.  Marland Kitchen aspirin 81 MG chewable tablet Chew 81 mg by mouth daily.  . carvedilol (COREG) 6.25 MG tablet  Take 1 tablet (6.25 mg total) by mouth 2 (two) times daily.  Marland Kitchen docusate sodium (COLACE) 100 MG capsule Take 100 mg by mouth daily as needed for mild constipation.  . fluticasone (FLONASE) 50 MCG/ACT nasal spray Place 2 sprays into the nose as needed for allergies.   . furosemide (LASIX) 40 MG tablet Take 1.5 tablets (60 mg total) by mouth daily.  . Hypromellose (ARTIFICIAL TEARS OP) Place 1 drop into both eyes daily as needed (dry eyes).  Marland Kitchen ipratropium (ATROVENT) 0.06 % nasal spray Place 2 sprays into both nostrils 2 (two) times daily.   Marland Kitchen levothyroxine (SYNTHROID, LEVOTHROID) 75 MCG tablet Take 1 tablet (75 mcg total) by mouth See admin instructions. Take 75 mcg daily in the morning except on Sundays take 37.5 mcg in the morning  . potassium chloride 20 MEQ/15ML (10%) SOLN Take 15 mLs (20 mEq total) by mouth daily.  . rosuvastatin (CRESTOR) 20 MG tablet TAKE 1 TABLET BY MOUTH DAILY  . vitamin B-12 (CYANOCOBALAMIN) 100 MCG tablet Take 100 mcg by mouth daily.   . vitamin C (ASCORBIC ACID) 500 MG tablet Take 500 mg by mouth daily.   . cefTRIAXone (ROCEPHIN) 10 g injection   . cefTRIAXone (ROCEPHIN) IVPB Inject 2 g into the vein daily. Wound infection  . oxyCODONE (OXY IR/ROXICODONE) 5 MG immediate release tablet Take by mouth as needed.   No facility-administered encounter medications on file as of 04/09/2019.     Allergies (verified) Lactose intolerance (gi)   History: Past  Medical History:  Diagnosis Date  . Acute blood loss anemia   . Acute encephalopathy   . Acute hypoxemic respiratory failure ([name])   . Acute idiopathic gout of right ankle   . Acute lumbar back pain   . Acute on chronic systolic heart failure ([place])   . Acute respiratory failure ([place])   . AKI (acute kidney injury) ([place])   . Altered mental status   . Aortic aneurysm without rupture ([place]) 02/09/2016  . Aortic root enlargement ([place]) 02/13/2012  . Aortic valve regurgitation, acquired 02/13/2012  . Arrhythmia   . Atrial  flutter ([place]) 03/18/2012  . Back pain   . Cardiac arrest ([place]) 02/01/2016  . CHF (congestive heart failure) ([place])   . Chronic anticoagulation 03/04/2013  . Chronic kidney disease    kidney fx studies increased   . Chronic lower back pain   . Chronic renal insufficiency   . Chronic systolic heart failure ([place]) 02/13/2012   Recent diagnosis 4 / 2013, LVEF 25% by Echo 12/2011  03/2013: Echo at [facility] Conclusions: 1. Left ventricular ejection fraction estimated by 2D at 40-45 percent. 2. Mild concentric left ventricular hypertrophy. 3. Mild left atrial enlargement. 4. Moderate aortic valve regurgitation. 5. The aortic root at the sinus(es) of valsalva is moderately dilated 6. Mild mitral valve regurgitation. 7.  . CKD (chronic kidney disease)   . CKD (chronic kidney disease), stage IV ([place]) 08/06/2013   Creatinine 2.4 on 07/04/13   . Claustrophobia   . Colon cancer screening 03/04/2013  . Debility 02/22/2016  . Diabetes mellitus type 2 in nonobese (HCC)   . Diabetes mellitus type 2 in obese ([place])   . Dysrhythmia    "palpitations"  . Encounter for central line placement   . Exertional dyspnea 01/2012  . 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 HCAP (healthcare-associated pneumonia)   . Heart murmur   . Hyperlipidemia 02/13/2012  . Hypertension   . Hypothyroidism   . Internal hemorrhoids without mention of complication 9/73/5329  . Labile blood pressure   . Left bundle branch block 02/13/2012  . Leg weakness, bilateral   . Long term (current) use of anticoagulants 08/06/2013   Eliquis therapy   . Long term current use of amiodarone 08/07/2016  . Lower extremity weakness   . Migraine 02/13/12   "opthalmic"  . Non-traumatic rhabdomyolysis   . Obesity (BMI 30-39.9) 02/13/2012  . Pain   . Paroxysmal atrial fibrillation (HCC)   . Pneumonia   . Retroperitoneal bleed   . Right ankle pain   . Right knee pain   . Severe aortic regurgitation 02/09/2016  . Special screening  for malignant neoplasms, colon 04/11/2013  . Thyroid activity decreased   . Tibial pain   . Varicose vein of leg    right  . Ventricular fibrillation ([place]) 02/09/2016  . Weakness of both lower extremities    Past Surgical History:  Procedure Laterality Date  . AORTIC VALVE REPLACEMENT  11/20/2018  . CARDIAC CATHETERIZATION N/A 02/17/2016   Procedure: Left Heart Cath and Coronary Angiography;  Surgeon: Jettie Booze, MD;  Location: [place] [facility];  Service: Cardiovascular;  Laterality: N/A;  . CARDIOVERSION  03/22/2012   Procedure: CARDIOVERSION;  Surgeon: Candee Furbish, MD;  Location: [facility] ENDOSCOPY;  Service: Cardiovascular;  Laterality: N/A;  . CARDIOVERSION  04/19/2012   Procedure: CARDIOVERSION;  Surgeon: Sinclair Grooms, MD;  Location: [place];  Service: Cardiovascular;  Laterality: N/A;  . COLONOSCOPY N/A  04/11/2013   Procedure: COLONOSCOPY;  Surgeon: Inda Castle, MD;  Location: [facility];  Service: Endoscopy;  Laterality: N/A;  . COLONOSCOPY N/A 04/11/2013   Procedure: COLONOSCOPY;  Surgeon: Inda Castle, MD;  Location: [facility] ENDOSCOPY;  Service: Endoscopy;  Laterality: N/A;  . EP IMPLANTABLE DEVICE N/A 02/17/2016   Procedure: BiV ICD Insertion CRT-D;  Surgeon: Evans Lance, MD;  Location: [place] [facility];  Service: Cardiovascular;  Laterality: N/A;  . FINGER SURGERY  2012   "4th digit right hand; thumb on left hand"  . RADIOLOGY WITH ANESTHESIA N/A 02/11/2016   Procedure: MRI OF THE BRAIN WITHOUT CONTRAST, LUMBAR WITHOUT CONTRAST;  Surgeon: Medication Radiologist, MD;  Location: [place];  Service: Radiology;  Laterality: N/A;  DR. WOOD/MRI  . RIGHT/LEFT HEART CATH AND CORONARY ANGIOGRAPHY N/A 07/26/2018   Procedure: RIGHT/LEFT HEART CATH AND CORONARY ANGIOGRAPHY;  Surgeon: Belva Crome, MD;  Location: [place] [facility];  Service: Cardiovascular;  Laterality: N/A;  . Skin melanocytoma excision  2012   "above left clavicle"  . STERNAL INCISION RECLOSURE  11/2018  .  STERNAL WIRE REMOVAL  11/2018  . STERNAL WOUND DEBRIDEMENT  11/2018  . TEE WITHOUT CARDIOVERSION  03/22/2012   Procedure: TRANSESOPHAGEAL ECHOCARDIOGRAM (TEE);  Surgeon: Candee Furbish, MD;  Location[facility] ENDOSCOPY;  Service: Cardiovascular;  Laterality: N/A;  . TEE WITHOUT CARDIOVERSION N/A 02/08/2016   Procedure: TRANSESOPHAGEAL ECHOCARDIOGRAM (TEE);  Surgeon: Lelon Perla, MD;  Location: [facility] ENDOSCOPY;  Service: Cardiovascular;  Laterality: N/A;   Family History  Problem Relation Age of Onset  . Hypertension Mother   . Heart disease Mother   . Heart failure Mother   . Diabetes Mother   . Hypertension Father   . Heart disease Father   . Heart failure Father    Social History   Socioeconomic History  . Marital status: Married    Spouse name: [name]  . Number of children: 1  . Years of education: Not on file  . Highest education level: Not on file  Occupational History    Employer: [employer]  Social Needs  . Financial resource strain: Not hard at all  . Food insecurity    Worry: Never true    Inability: Never true  . Transportation needs    Medical: No    Non-medical: No  Tobacco Use  . Smoking status: Never Smoker  . Smokeless tobacco: Never Used  Substance and Sexual Activity  . Alcohol use: Yes    Comment: rarely  . Drug use: No  . Sexual activity: Not Currently  Lifestyle  . Physical activity    Days per week: 0 days    Minutes per session: 0 min  . Stress: To some extent  Relationships  . Social Herbalist on phone: Not on file    Gets together: Not on file    Attends religious service: Not on file    Active member of club or organization: Not on file    Attends meetings of clubs or organizations: Not on file    Relationship status: Not on file  Other Topics Concern  . 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   Tobacco Counseling Counseling given: Not  Answered   Clinical Intake:  Pre-visit preparation completed: Yes  Pain : No/denies pain     Nutritional Status: BMI > 30  Obese Nutritional Risks:  None Diabetes: No  How often do you need to have someone help you when you read instructions, pamphlets, or other written materials from your doctor or pharmacy?: 1 - Never What is the last grade level you completed in school?: Medical school  Interpreter Needed?: No  Information entered by :: NAllen LPN  Activities of Daily Living In your present state of health, do you have any difficulty performing the following activities: 04/09/2019  Hearing? N  Vision? N  Difficulty concentrating or making decisions? N  Walking or climbing stairs? Y  Comment uses cane  Dressing or bathing? N  Doing errands, shopping? N  Preparing Food and eating ? N  Using the Toilet? N  In the past six months, have you accidently leaked urine? Y  Comment once while asleep  Do you have problems with loss of bowel control? N  Managing your Medications? N  Managing your Finances? N  Housekeeping or managing your Housekeeping? N  Some recent data might be hidden     Immunizations and Health Maintenance Immunization History  Administered Date(s) Administered  . Influenza-Unspecified 04/25/2018   Health Maintenance Due  Topic Date Due  . OPHTHALMOLOGY EXAM  12/05/1961  . URINE MICROALBUMIN  12/05/1961  . PNA vac Low Risk Adult (1 of 2 - PCV13) 12/05/2016  . HEMOGLOBIN A1C  04/09/2019    Patient Care Team: Glendale Chard, MD as PCP - General (Internal Medicine) Belva Crome, MD as Consulting Physician (Cardiology)  Indicate any recent Medical Services you may have received from other than [facility] providers in the past year (date may be approximate).     Assessment:   This is a routine wellness examination for Eric [name].  Hearing/Vision screen  Hearing Screening   125Hz  250Hz  500Hz  1000Hz  2000Hz  3000Hz  4000Hz  6000Hz  8000Hz   Right ear:            Left ear:           Vision Screening Comments: Annual eye exams  Dietary issues and exercise activities discussed: Current Exercise Habits: The patient does not participate in regular exercise at present  Goals    . Patient Stated     To be able to go up and down flights of stairs with less shortness of breathe.     . Weight (lb) < 200 lb (90.7 kg)     Wants to lose 15-20 pounds.      Depression Screen PHQ 2/9 Scores 04/09/2019 10/09/2018 12/18/2016  PHQ - 2 Score 0 0 0  PHQ- 9 Score 2 - -    Fall Risk Fall Risk  04/09/2019 10/09/2018 12/14/2016  Falls in the past year? 0 0 Yes  Number falls in past yr: 0 - 1  Injury with Fall? - - Yes  Comment - - R knee contusion  Risk for fall due to : Medication side effect - Impaired balance/gait  Follow up Falls evaluation completed;Falls prevention discussed - -    Is the patient's home free of loose throw rugs in walkways, pet beds, electrical cords, etc?   yes      Grab bars in the bathroom? yes      Handrails on the stairs?   yes      Adequate lighting?   yes  Timed Get Up and Go performed: n/a  Cognitive Function:     6CIT Screen 04/09/2019  What Year? 0 points  What month? 0 points  What time? 0 points  Count back from 20 0 points  Months  in reverse 2 points  Repeat phrase 0 points  Total Score 2    Screening Tests Health Maintenance  Topic Date Due  . OPHTHALMOLOGY EXAM  12/05/1961  . URINE MICROALBUMIN  12/05/1961  . PNA vac Low Risk Adult (1 of 2 - PCV13) 12/05/2016  . HEMOGLOBIN A1C  04/09/2019  . INFLUENZA VACCINE  04/26/2019  . FOOT EXAM  02/04/2020  . COLONOSCOPY  04/12/2023  . TETANUS/TDAP  02/25/2027  . Hepatitis C Screening  Completed    Qualifies for Shingles Vaccine? yes  Cancer Screenings: Lung: Low Dose CT Chest recommended if Age 36-80 years, 30 pack-year currently smoking OR have quit w/in 15years. Patient does not qualify. Colorectal: up to date  Additional Screenings:  Hepatitis C  Screening: 02/24/2012      Plan:    6 CIT was 2. Wants to lose 15-20 pounds and be able to go up and down stairs without getting SOB. Had an aortic valve replacement in February.  I have personally reviewed and noted the following in the patient's chart:   . Medical and social history . Use of alcohol, tobacco or illicit drugs  . Current medications and supplements . Functional ability and status . Nutritional status . Physical activity . Advanced directives . List of other physicians . Hospitalizations, surgeries, and ER visits in previous 12 months . Vitals . Screenings to include cognitive, depression, and falls . Referrals and appointments  In addition, I have reviewed and discussed with patient certain preventive protocols, quality metrics, and best practice recommendations. A written personalized care plan for preventive services as well as general preventive health recommendations were provided to patient.     Kellie Simmering, LPN   8/37/7939

## 2019-04-23 ENCOUNTER — Encounter: Payer: Medicare Other | Admitting: *Deleted

## 2019-04-24 ENCOUNTER — Telehealth: Payer: Self-pay

## 2019-04-24 NOTE — Telephone Encounter (Signed)
Spoke with patient to remind of missed remote transmission 

## 2019-04-25 ENCOUNTER — Ambulatory Visit (INDEPENDENT_AMBULATORY_CARE_PROVIDER_SITE_OTHER): Payer: Medicare Other | Admitting: Podiatry

## 2019-04-25 ENCOUNTER — Other Ambulatory Visit: Payer: Self-pay

## 2019-04-25 ENCOUNTER — Encounter: Payer: Self-pay | Admitting: Podiatry

## 2019-04-25 VITALS — Temp 97.2°F

## 2019-04-25 DIAGNOSIS — B351 Tinea unguium: Secondary | ICD-10-CM

## 2019-04-25 DIAGNOSIS — M79674 Pain in right toe(s): Secondary | ICD-10-CM

## 2019-04-25 DIAGNOSIS — L608 Other nail disorders: Secondary | ICD-10-CM

## 2019-04-25 DIAGNOSIS — D689 Coagulation defect, unspecified: Secondary | ICD-10-CM | POA: Diagnosis not present

## 2019-04-25 DIAGNOSIS — M79675 Pain in left toe(s): Secondary | ICD-10-CM | POA: Diagnosis not present

## 2019-04-25 DIAGNOSIS — E119 Type 2 diabetes mellitus without complications: Secondary | ICD-10-CM | POA: Diagnosis not present

## 2019-04-25 NOTE — Progress Notes (Signed)
Complaint:  Visit Type: Patient returns to my office for continued preventative foot care services. Complaint: Patient states" my nails have grown long and thick and become painful to walk and wear shoes" Patient has been diagnosed with DM with no foot complications. The patient presents for preventative foot care services. No changes to ROS.  Patient is taking eliquiss.  Podiatric Exam: Vascular: dorsalis pedis and posterior tibial pulses are palpable bilateral. Capillary return is immediate. Temperature gradient is WNL. Skin turgor WNL  Sensorium: Normal Semmes Weinstein monofilament test. Normal tactile sensation bilaterally. Nail Exam: Pt has thick disfigured discolored nails with subungual debris noted bilateral entire nail hallux through fifth toenails.  Pincer nails noted. Ulcer Exam: There is no evidence of ulcer or pre-ulcerative changes or infection. Orthopedic Exam: Muscle tone and strength are WNL. No limitations in general ROM. No crepitus or effusions noted. Foot type and digits show no abnormalities. Bony prominences are unremarkable. Skin: No Porokeratosis. No infection or ulcers  Diagnosis:  Onychomycosis, , Pain in right toe, pain in left toes Pincer nails    Treatment & Plan Procedures and Treatment: Consent by patient was obtained for treatment procedures.   Debridement of mycotic and hypertrophic toenails, 1 through 5 bilateral and clearing of subungual debris. No ulceration, no infection noted.  Return Visit-Office Procedure: Patient instructed to return to the office for a follow up visit 3 months for continued evaluation and treatment.    Gardiner Barefoot DPM

## 2019-05-02 ENCOUNTER — Ambulatory Visit: Payer: Medicare Other | Admitting: Internal Medicine

## 2019-05-09 ENCOUNTER — Ambulatory Visit (INDEPENDENT_AMBULATORY_CARE_PROVIDER_SITE_OTHER): Payer: Medicare Other | Admitting: *Deleted

## 2019-05-09 ENCOUNTER — Ambulatory Visit: Payer: Medicare Other | Admitting: Podiatry

## 2019-05-09 DIAGNOSIS — I469 Cardiac arrest, cause unspecified: Secondary | ICD-10-CM | POA: Diagnosis not present

## 2019-05-09 DIAGNOSIS — I5022 Chronic systolic (congestive) heart failure: Secondary | ICD-10-CM

## 2019-05-11 LAB — CUP PACEART REMOTE DEVICE CHECK
Battery Remaining Longevity: 49 mo
Battery Remaining Percentage: 59 %
Battery Voltage: 2.95 V
Date Time Interrogation Session: 20200814192735
HighPow Impedance: 63 Ohm
HighPow Impedance: 63 Ohm
Implantable Lead Implant Date: 20170525
Implantable Lead Implant Date: 20170525
Implantable Lead Implant Date: 20170525
Implantable Lead Location: 753858
Implantable Lead Location: 753859
Implantable Lead Location: 753860
Implantable Lead Model: 7122
Implantable Pulse Generator Implant Date: 20170525
Lead Channel Impedance Value: 380 Ohm
Lead Channel Impedance Value: 390 Ohm
Lead Channel Impedance Value: 560 Ohm
Lead Channel Pacing Threshold Amplitude: 0.5 V
Lead Channel Pacing Threshold Amplitude: 1.5 V
Lead Channel Pacing Threshold Pulse Width: 0.5 ms
Lead Channel Pacing Threshold Pulse Width: 0.5 ms
Lead Channel Sensing Intrinsic Amplitude: 0.3 mV
Lead Channel Sensing Intrinsic Amplitude: 11.4 mV
Lead Channel Setting Pacing Amplitude: 2 V
Lead Channel Setting Pacing Amplitude: 2.5 V
Lead Channel Setting Pacing Pulse Width: 0.5 ms
Lead Channel Setting Pacing Pulse Width: 0.5 ms
Lead Channel Setting Sensing Sensitivity: 0.5 mV
Pulse Gen Serial Number: 7357926

## 2019-05-13 ENCOUNTER — Other Ambulatory Visit: Payer: Self-pay

## 2019-05-13 ENCOUNTER — Encounter: Payer: Self-pay | Admitting: Internal Medicine

## 2019-05-13 ENCOUNTER — Ambulatory Visit (INDEPENDENT_AMBULATORY_CARE_PROVIDER_SITE_OTHER): Payer: Medicare Other | Admitting: Internal Medicine

## 2019-05-13 VITALS — BP 134/86 | HR 67 | Temp 98.2°F | Ht 69.0 in | Wt 218.8 lb

## 2019-05-13 DIAGNOSIS — E6609 Other obesity due to excess calories: Secondary | ICD-10-CM | POA: Diagnosis not present

## 2019-05-13 DIAGNOSIS — N183 Chronic kidney disease, stage 3 unspecified: Secondary | ICD-10-CM

## 2019-05-13 DIAGNOSIS — I5022 Chronic systolic (congestive) heart failure: Secondary | ICD-10-CM | POA: Diagnosis not present

## 2019-05-13 DIAGNOSIS — E66811 Obesity, class 1: Secondary | ICD-10-CM

## 2019-05-13 DIAGNOSIS — E1122 Type 2 diabetes mellitus with diabetic chronic kidney disease: Secondary | ICD-10-CM | POA: Diagnosis not present

## 2019-05-13 DIAGNOSIS — I130 Hypertensive heart and chronic kidney disease with heart failure and stage 1 through stage 4 chronic kidney disease, or unspecified chronic kidney disease: Secondary | ICD-10-CM

## 2019-05-13 DIAGNOSIS — Z6832 Body mass index (BMI) 32.0-32.9, adult: Secondary | ICD-10-CM

## 2019-05-13 DIAGNOSIS — E039 Hypothyroidism, unspecified: Secondary | ICD-10-CM

## 2019-05-13 DIAGNOSIS — I480 Paroxysmal atrial fibrillation: Secondary | ICD-10-CM

## 2019-05-13 MED ORDER — IPRATROPIUM BROMIDE 0.06 % NA SOLN: 2.0000 | Freq: Two times a day (BID) | NASAL | 3 refills | Status: DC

## 2019-05-14 LAB — CMP14+EGFR
ALT: 45 IU/L — ABNORMAL HIGH (ref 0–44)
AST: 81 IU/L — ABNORMAL HIGH (ref 0–40)
Albumin/Globulin Ratio: 0.5 — ABNORMAL LOW (ref 1.2–2.2)
Albumin: 3.3 g/dL — ABNORMAL LOW (ref 3.8–4.8)
Alkaline Phosphatase: 77 IU/L (ref 39–117)
BUN/Creatinine Ratio: 13 (ref 10–24)
BUN: 27 mg/dL (ref 8–27)
Bilirubin Total: 1 mg/dL (ref 0.0–1.2)
CO2: 20 mmol/L (ref 20–29)
Calcium: 9.2 mg/dL (ref 8.6–10.2)
Chloride: 103 mmol/L (ref 96–106)
Creatinine, Ser: 2.07 mg/dL — ABNORMAL HIGH (ref 0.76–1.27)
GFR calc Af Amer: 37 mL/min/{1.73_m2} — ABNORMAL LOW (ref >59)
GFR calc non Af Amer: 32 mL/min/{1.73_m2} — ABNORMAL LOW (ref >59)
Globulin, Total: 6.9 g/dL — ABNORMAL HIGH (ref 1.5–4.5)
Glucose: 98 mg/dL (ref 65–99)
Potassium: 3.9 mmol/L (ref 3.5–5.2)
Sodium: 136 mmol/L (ref 134–144)
Total Protein: 10.2 g/dL (ref 6.0–8.5)

## 2019-05-14 LAB — LIPID PANEL
Chol/HDL Ratio: 2.5 ratio (ref 0.0–5.0)
Cholesterol, Total: 121 mg/dL (ref 100–199)
HDL: 49 mg/dL (ref >39)
LDL Calculated: 62 mg/dL (ref 0–99)
Triglycerides: 48 mg/dL (ref 0–149)
VLDL Cholesterol Cal: 10 mg/dL (ref 5–40)

## 2019-05-14 LAB — CBC
Hematocrit: 30.6 % — ABNORMAL LOW (ref 37.5–51.0)
Hemoglobin: 9.2 g/dL — ABNORMAL LOW (ref 13.0–17.7)
MCH: 24.7 pg — ABNORMAL LOW (ref 26.6–33.0)
MCHC: 30.1 g/dL — ABNORMAL LOW (ref 31.5–35.7)
MCV: 82 fL (ref 79–97)
Platelets: 313 10*3/uL (ref 150–450)
RBC: 3.73 x10E6/uL — ABNORMAL LOW (ref 4.14–5.80)
RDW: 17.5 % — ABNORMAL HIGH (ref 11.6–15.4)
WBC: 5.5 10*3/uL (ref 3.4–10.8)

## 2019-05-14 LAB — T4, FREE: Free T4: 1.74 ng/dL (ref 0.82–1.77)

## 2019-05-14 LAB — HEMOGLOBIN A1C
Est. average glucose Bld gHb Est-mCnc: 148 mg/dL
Hgb A1c MFr Bld: 6.8 % — ABNORMAL HIGH (ref 4.8–5.6)

## 2019-05-14 LAB — TSH: TSH: 3.43 u[IU]/mL (ref 0.450–4.500)

## 2019-05-19 ENCOUNTER — Encounter: Payer: Self-pay | Admitting: Internal Medicine

## 2019-05-19 NOTE — Progress Notes (Signed)
Remote ICD transmission.   

## 2019-05-23 NOTE — Progress Notes (Signed)
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   MRN: [id number]   Chief Complaint  Patient presents with  . Hypertension  . Diabetes    HPI  Hypertension This is a chronic problem. The current episode started more than 1 year ago. The problem has been gradually improving since onset. The problem is controlled. Pertinent negatives include no blurred vision, chest pain, palpitations or shortness of breath. Risk factors for coronary artery disease include diabetes mellitus, dyslipidemia, sedentary lifestyle, obesity, male gender and stress. Past treatments include beta blockers and diuretics. The current treatment provides moderate improvement. Hypertensive end-organ damage includes kidney disease.  Diabetes He presents for his follow-up diabetic visit. He has type 2 diabetes mellitus. There are no hypoglycemic associated symptoms. Pertinent negatives for diabetes include no blurred vision and no chest pain. There are no hypoglycemic complications. He is following a diabetic diet. Meal planning includes avoidance of concentrated sweets. He participates in exercise intermittently. His breakfast blood glucose is taken between 8-9 am. His breakfast blood glucose range is generally 110-130 mg/dl. An ACE inhibitor/angiotensin II receptor blocker is not being taken. He sees a podiatrist.Eye exam is not current.     Past Medical History:  Diagnosis Date  . Acute blood loss anemia   . Acute encephalopathy   . Acute hypoxemic respiratory failure ([place])   . Acute idiopathic gout of right ankle   . Acute lumbar back pain   . Acute on chronic systolic heart failure ([place])   . Acute respiratory failure ([place])   . AKI (acute kidney injury) ([place])   . Altered mental status   . Aortic aneurysm without rupture ([place]) 02/09/2016  . Aortic root enlargement ([place]) 02/13/2012  . Aortic valve regurgitation, acquired 02/13/2012  . Arrhythmia   . Atrial flutter ([place]) 03/18/2012   . Back pain   . Cardiac arrest ([place]) 02/01/2016  . CHF (congestive heart failure) ([place])   . Chronic anticoagulation 03/04/2013  . Chronic kidney disease    kidney fx studies increased   . Chronic lower back pain   . Chronic renal insufficiency   . Chronic systolic heart failure ([place]) 02/13/2012   Recent diagnosis 4 / 2013, LVEF 25% by Echo 12/2011  03/2013: Echo at [facility] Cardiology Conclusions: 1. Left ventricular ejection fraction estimated by 2D at 40-45 percent. 2. Mild concentric left ventricular hypertrophy. 3. Mild left atrial enlargement. 4. Moderate aortic valve regurgitation. 5. The aortic root at the sinus(es) of valsalva is moderately dilated 6. Mild mitral valve regurgitation. 7.  . CKD (chronic kidney disease)   . CKD (chronic kidney disease), stage IV ([place]) 08/06/2013   Creatinine 2.4 on 07/04/13   . Claustrophobia   . Colon cancer screening 03/04/2013  . Debility 02/22/2016  . Diabetes mellitus type 2 in nonobese (HCC)   . Diabetes mellitus type 2 in obese ([place])   . Dysrhythmia    "palpitations"  . Encounter for central line placement   . Exertional dyspnea 01/2012  . 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 HCAP (healthcare-associated pneumonia)   . Heart murmur   . Hyperlipidemia 02/13/2012  . Hypertension   . Hypothyroidism   . Internal hemorrhoids without mention of complication 4/00/8676  . Labile blood pressure   . Left bundle branch block 02/13/2012  . Leg weakness, bilateral   . Long term (current) use of anticoagulants 08/06/2013   Eliquis therapy   .  Long term current use of amiodarone 08/07/2016  . Lower extremity weakness   . Migraine 02/13/12   "opthalmic"  . Non-traumatic rhabdomyolysis   . Obesity (BMI 30-39.9) 02/13/2012  . Pain   . Paroxysmal atrial fibrillation (HCC)   . Pneumonia   . Retroperitoneal bleed   . Right ankle pain   . Right knee pain   . Severe aortic regurgitation 02/09/2016  . Special screening for malignant  neoplasms, colon 04/11/2013  . Thyroid activity decreased   . Tibial pain   . Varicose vein of leg    right  . Ventricular fibrillation ([place]) 02/09/2016  . Weakness of both lower extremities      Family History  Problem Relation Age of Onset  . Hypertension Mother   . Heart disease Mother   . Heart failure Mother   . Diabetes Mother   . Hypertension Father   . Heart disease Father   . Heart failure Father      Current Outpatient Medications:  .  acetaminophen (TYLENOL) 500 MG tablet, Take 1,500 mg by mouth every 4 (four) hours as needed for moderate pain or headache. , Disp: , Rfl:  .  amiodarone (PACERONE) 200 MG tablet, Take 200 mg by mouth daily., Disp: , Rfl:  .  apixaban (ELIQUIS) 5 MG TABS tablet, Take 1 tablet (5 mg total) by mouth 2 (two) times daily., Disp: 60 tablet, Rfl: 11 .  aspirin 81 MG chewable tablet, Chew 81 mg by mouth daily., Disp: , Rfl:  .  carvedilol (COREG) 6.25 MG tablet, Take 1 tablet (6.25 mg total) by mouth 2 (two) times daily., Disp: 180 tablet, Rfl: 3 .  docusate sodium (COLACE) 100 MG capsule, Take 100 mg by mouth daily as needed for mild constipation., Disp: , Rfl:  .  fluticasone (FLONASE) 50 MCG/ACT nasal spray, Place 2 sprays into the nose as needed for allergies. , Disp: , Rfl:  .  furosemide (LASIX) 40 MG tablet, Take 1.5 tablets (60 mg total) by mouth daily., Disp: 135 tablet, Rfl: 3 .  Hypromellose (ARTIFICIAL TEARS OP), Place 1 drop into both eyes daily as needed (dry eyes)., Disp: , Rfl:  .  ipratropium (ATROVENT) 0.06 % nasal spray, Place 2 sprays into both nostrils 2 (two) times daily., Disp: 15 mL, Rfl: 3 .  levothyroxine (SYNTHROID, LEVOTHROID) 75 MCG tablet, Take 1 tablet (75 mcg total) by mouth See admin instructions. Take 75 mcg daily in the morning except on Sundays take 37.5 mcg in the morning, Disp: 90 tablet, Rfl: 1 .  potassium chloride 10 MEQ/100ML, , Disp: , Rfl:  .  potassium chloride 20 MEQ/15ML (10%) SOLN, Take 15 mLs (20 mEq  total) by mouth daily., Disp: 473 mL, Rfl: 6 .  rosuvastatin (CRESTOR) 20 MG tablet, TAKE 1 TABLET BY MOUTH DAILY, Disp: 90 tablet, Rfl: 3 .  vitamin B-12 (CYANOCOBALAMIN) 100 MCG tablet, Take 100 mcg by mouth daily. , Disp: , Rfl:  .  vitamin C (ASCORBIC ACID) 500 MG tablet, Take 500 mg by mouth daily. , Disp: , Rfl:    Allergies  Allergen Reactions  . Lactose Intolerance (Gi) Diarrhea     Review of Systems  Constitutional: Negative.   Eyes: Negative for blurred vision.  Respiratory: Negative.  Negative for shortness of breath.   Cardiovascular: Negative.  Negative for chest pain and palpitations.  Gastrointestinal: Negative.   Neurological: Negative.   Psychiatric/Behavioral: Negative.      Today's Vitals   05/13/19 1444  BP: 134/86  Pulse: 67  Temp: 98.2 F (36.8 C)  TempSrc: Oral  Weight: 218 lb 12.8 oz (99.2 kg)  Height: _0  (1.753 m)  PainSc: 0-No pain   Body mass index is 32.31 kg/m.   Objective:  Physical Exam Vitals signs and nursing note reviewed.  Constitutional:      Appearance: Normal appearance.  Cardiovascular:     Rate and Rhythm: Normal rate. Rhythm irregular.     Heart sounds: Normal heart sounds.  Pulmonary:     Effort: Pulmonary effort is normal.     Breath sounds: Normal breath sounds.  Skin:    General: Skin is warm.  Neurological:     General: No focal deficit present.     Mental Status: He is alert.  Psychiatric:        Mood and Affect: Mood normal.         Assessment And Plan:     1. Hypertensive heart and renal disease with renal failure, stage 1 through stage 4 or unspecified chronic kidney disease, with heart failure ([place])  Fair control. He will continue with current meds. Importance of dietary compliance was discussed with the patient. His CKD stage 3 is chronic. He is also followed by Nephrology.   2. Paroxysmal atrial fibrillation (HCC)  Chronic, rate controlled. He reports compliance with anticoagulant therapy.   3.  Chronic systolic heart failure (HCC)  Chronic, yet stable.  Importance of salt restriction was discussed with the patient.   4. Type 2 diabetes mellitus with stage 3 chronic kidney disease, without long-term current use of insulin (HCC)  Chronic. He does not wish to take rx meds at this time. He has controlled this through dietary modifications.  I will check labs as listed below.   - Lipid panel - CMP14+EGFR - Hemoglobin A1c - CBC no Diff  5. Hypothyroidism (acquired)  I will check thyroid panel and adjust meds as needed.  - TSH - T4, Free  6. Class 1 obesity due to excess calories with serious comorbidity and body mass index (BMI) of 32.0 to 32.9 in adult  He is encouraged to strive for BMI less than 28 to decrease cardiac risk. Importance of regular exercise was discussed with the patient.   [name], MD    THE PATIENT IS ENCOURAGED TO PRACTICE SOCIAL DISTANCING DUE TO THE COVID-19 PANDEMIC.

## 2019-06-03 ENCOUNTER — Ambulatory Visit (INDEPENDENT_AMBULATORY_CARE_PROVIDER_SITE_OTHER): Payer: Medicare Other | Admitting: Internal Medicine

## 2019-06-03 ENCOUNTER — Other Ambulatory Visit: Payer: Self-pay

## 2019-06-03 ENCOUNTER — Encounter: Payer: Self-pay | Admitting: Internal Medicine

## 2019-06-03 VITALS — BP 136/92 | HR 70 | Ht 69.0 in | Wt 216.0 lb

## 2019-06-03 DIAGNOSIS — I480 Paroxysmal atrial fibrillation: Secondary | ICD-10-CM | POA: Diagnosis not present

## 2019-06-03 MED ORDER — AMIODARONE HCL 100 MG PO TABS: 100.0000 mg | ORAL_TABLET | Freq: Every day | ORAL | 3 refills | Status: DC

## 2019-06-03 NOTE — Patient Instructions (Signed)
Medication Instructions:  Amiodarone 100 mg Daily If you need a refill on your cardiac medications before your next appointment, please call your pharmacy.   Lab work: none If you have labs (blood work) drawn today and your tests are completely normal, you will receive your results only by: Marland Kitchen MyChart Message (if you have MyChart) OR . A paper copy in the mail If you have any lab test that is abnormal or we need to change your treatment, we will call you to review the results.  Testing/Procedures: none  Follow-Up: At [facility], you and your health needs are our priority.  As part of our continuing mission to provide you with exceptional heart care, we have created designated Provider Care Teams.  These Care Teams include your primary Cardiologist (physician) and Advanced Practice Providers (APPs -  Physician Assistants and Nurse Practitioners) who all work together to provide you with the care you need, when you need it. You will need a follow up appointment in 6 months.  Please call our office 2 months in advance to schedule this appointment.  You may see Dr Lovena Le or one of the following Advanced Practice Providers on your designated Care Team:   Chanetta Marshall, NP . Tommye Standard, PA-C  Any Other Special Instructions Will Be Listed Below (If Applicable).

## 2019-06-03 NOTE — Progress Notes (Signed)
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 He had sternal wound infection after his aortic valve replacement but has finally healed. No residual drainage or infection. He is back working. No ICD shocks.  Allergies  Allergen Reactions  . Lactose Intolerance (Gi) Diarrhea     Current Outpatient Medications  Medication Sig Dispense Refill  . acetaminophen (TYLENOL) 500 MG tablet Take 1,500 mg by mouth every 4 (four) hours as needed for moderate pain or headache.     Marland Kitchen amiodarone (PACERONE) 200 MG tablet Take 200 mg by mouth daily.    Marland Kitchen apixaban (ELIQUIS) 5 MG TABS tablet Take 1 tablet (5 mg total) by mouth 2 (two) times daily. 60 tablet 11  . aspirin 81 MG chewable tablet Chew 81 mg by mouth daily.    . carvedilol (COREG) 6.25 MG tablet Take 1 tablet (6.25 mg total) by mouth 2 (two) times daily. 180 tablet 3  . docusate sodium (COLACE) 100 MG capsule Take 100 mg by mouth daily as needed for mild constipation.    . fluticasone (FLONASE) 50 MCG/ACT nasal spray Place 2 sprays into the nose as needed for allergies.     . Hypromellose (ARTIFICIAL TEARS OP) Place 1 drop into both eyes daily as needed (dry eyes).    Marland Kitchen ipratropium (ATROVENT) 0.06 % nasal spray Place 2 sprays into both nostrils 2 (two) times daily. 15 mL 3  . levothyroxine (SYNTHROID, LEVOTHROID) 75 MCG tablet Take 1 tablet (75 mcg total) by mouth See admin instructions. Take 75 mcg daily in the morning except on Sundays take 37.5 mcg in the morning 90 tablet 1  . potassium chloride 20 MEQ/15ML (10%) SOLN Take 15 mLs (20 mEq total) by mouth daily. 473 mL 6  . rosuvastatin (CRESTOR) 20 MG tablet TAKE 1 TABLET BY MOUTH DAILY 90 tablet 3  . vitamin B-12 (CYANOCOBALAMIN) 100 MCG tablet Take 100 mcg by mouth daily.     . vitamin C (ASCORBIC ACID) 500 MG tablet Take 500 mg by mouth as needed.      . furosemide (LASIX) 40 MG tablet Take 1.5 tablets (60 mg total) by mouth daily. 135 tablet 3   No current facility-administered medications for this visit.      Past Medical History:  Diagnosis Date  . Acute blood loss anemia   . Acute encephalopathy   . Acute hypoxemic respiratory failure ([place])   . Acute idiopathic gout of right ankle   . Acute lumbar back pain   . Acute on chronic systolic heart failure ([place])   . Acute respiratory failure ([place])   . AKI (acute kidney injury) ([place])   . Altered mental status   . Aortic aneurysm without rupture ([place]) 02/09/2016  . Aortic root enlargement ([place]) 02/13/2012  . Aortic valve regurgitation, acquired 02/13/2012  . Arrhythmia   . Atrial flutter ([place]) 03/18/2012  . Back pain   . Cardiac arrest ([place]) 02/01/2016  . CHF (congestive heart failure) ([place])   . Chronic anticoagulation 03/04/2013  . Chronic kidney disease    kidney fx studies increased   . Chronic lower back pain   . Chronic renal insufficiency   . Chronic systolic heart failure ([place]) 02/13/2012   Recent diagnosis 4 / 2013, LVEF 25% by Echo 12/2011  03/2013: Echo at [facility] Conclusions: 1. Left ventricular ejection fraction estimated by 2D  at 40-45 percent. 2. Mild concentric left ventricular hypertrophy. 3. Mild left atrial enlargement. 4. Moderate aortic valve regurgitation. 5. The aortic root at the sinus(es) of valsalva is moderately dilated 6. Mild mitral valve regurgitation. 7.  . CKD (chronic kidney disease)   . CKD (chronic kidney disease), stage IV ([place]) 08/06/2013   Creatinine 2.4 on 07/04/13   . Claustrophobia   . Colon cancer screening 03/04/2013  . Debility 02/22/2016  . Diabetes mellitus type 2 in nonobese (HCC)   . Diabetes mellitus type 2 in obese ([place])   . Dysrhythmia    "palpitations"  . Encounter for central line placement   . Exertional dyspnea 01/2012  . 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 HCAP (healthcare-associated pneumonia)   .  Heart murmur   . Hyperlipidemia 02/13/2012  . Hypertension   . Hypothyroidism   . Internal hemorrhoids without mention of complication 3/35/4562  . Labile blood pressure   . Left bundle branch block 02/13/2012  . Leg weakness, bilateral   . Long term (current) use of anticoagulants 08/06/2013   Eliquis therapy   . Long term current use of amiodarone 08/07/2016  . Lower extremity weakness   . Migraine 02/13/12   "opthalmic"  . Non-traumatic rhabdomyolysis   . Obesity (BMI 30-39.9) 02/13/2012  . Pain   . Paroxysmal atrial fibrillation (HCC)   . Pneumonia   . Retroperitoneal bleed   . Right ankle pain   . Right knee pain   . Severe aortic regurgitation 02/09/2016  . Special screening for malignant neoplasms, colon 04/11/2013  . Thyroid activity decreased   . Tibial pain   . Varicose vein of leg    right  . Ventricular fibrillation ([place]) 02/09/2016  . Weakness of both lower extremities     ROS:   All systems reviewed and negative except as noted in the HPI.   Past Surgical History:  Procedure Laterality Date  . AORTIC VALVE REPLACEMENT  11/20/2018  . CARDIAC CATHETERIZATION N/A 02/17/2016   Procedure: Left Heart Cath and Coronary Angiography;  Surgeon: Jettie Booze, MD;  Location: [place] CV [facility];  Service: Cardiovascular;  Laterality: N/A;  . CARDIOVERSION  03/22/2012   Procedure: CARDIOVERSION;  Surgeon: Candee Furbish, MD;  Location: [facility] ENDOSCOPY;  Service: Cardiovascular;  Laterality: N/A;  . CARDIOVERSION  04/19/2012   Procedure: CARDIOVERSION;  Surgeon: Sinclair Grooms, MD;  Location: [place];  Service: Cardiovascular;  Laterality: N/A;  . COLONOSCOPY N/A 04/11/2013   Procedure: COLONOSCOPY;  Surgeon: Inda Castle, MD;  Location: [facility] ENDOSCOPY;  Service: Endoscopy;  Laterality: N/A;  . COLONOSCOPY N/A 04/11/2013   Procedure: COLONOSCOPY;  Surgeon: Inda Castle, MD;  Location: [facility] ENDOSCOPY;  Service: Endoscopy;  Laterality: N/A;  . EP IMPLANTABLE DEVICE N/A  02/17/2016   Procedure: BiV ICD Insertion CRT-D;  Surgeon: Evans Lance, MD;  Location: [place] [facility];  Service: Cardiovascular;  Laterality: N/A;  . FINGER SURGERY  2012   "4th digit right hand; thumb on left hand"  . RADIOLOGY WITH ANESTHESIA N/A 02/11/2016   Procedure: MRI OF THE BRAIN WITHOUT CONTRAST, LUMBAR WITHOUT CONTRAST;  Surgeon: Medication Radiologist, MD;  Location: [place];  Service: Radiology;  Laterality: N/A;  DR. WOOD/MRI  . RIGHT/LEFT HEART CATH AND CORONARY ANGIOGRAPHY N/A 07/26/2018   Procedure: RIGHT/LEFT HEART CATH AND CORONARY ANGIOGRAPHY;  Surgeon: Belva Crome, MD;  Location: [place] [facility];  Service: Cardiovascular;  Laterality: N/A;  . Skin melanocytoma  excision  2012   "above left clavicle"  . STERNAL INCISION RECLOSURE  11/2018  . STERNAL WIRE REMOVAL  11/2018  . STERNAL WOUND DEBRIDEMENT  11/2018  . TEE WITHOUT CARDIOVERSION  03/22/2012   Procedure: TRANSESOPHAGEAL ECHOCARDIOGRAM (TEE);  Surgeon: Candee Furbish, MD;  Location: [facility] ENDOSCOPY;  Service: Cardiovascular;  Laterality: N/A;  . TEE WITHOUT CARDIOVERSION N/A 02/08/2016   Procedure: TRANSESOPHAGEAL ECHOCARDIOGRAM (TEE);  Surgeon: Lelon Perla, MD;  Location: [facility] ENDOSCOPY;  Service: Cardiovascular;  Laterality: N/A;     Family History  Problem Relation Age of Onset  . Hypertension Mother   . Heart disease Mother   . Heart failure Mother   . Diabetes Mother   . Hypertension Father   . Heart disease Father   . Heart failure Father      Social History   Socioeconomic History  . Marital status: Married    Spouse name: [name]  . Number of children: 1  . Years of education: Not on file  . Highest education level: Not on file  Occupational History    Employer: [employer]  Social Needs  . Financial resource strain: Not hard at all  . Food insecurity    Worry: Never true    Inability: Never true  . Transportation needs    Medical: No    Non-medical: No  Tobacco Use  .  Smoking status: Never Smoker  . Smokeless tobacco: Never Used  Substance and Sexual Activity  . Alcohol use: Yes    Comment: rarely  . Drug use: No  . Sexual activity: Not Currently  Lifestyle  . Physical activity    Days per week: 0 days    Minutes per session: 0 min  . Stress: To some extent  Relationships  . Social Herbalist on phone: Not on file    Gets together: Not on file    Attends religious service: Not on file    Active member of club or organization: Not on file    Attends meetings of clubs or organizations: Not on file    Relationship status: Not on file  . Intimate partner violence    Fear of current or ex partner: No    Emotionally abused: No    Physically abused: No    Forced sexual activity: No  Other Topics Concern  . 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     BP (!) 136/92   Pulse 70   Ht 5\' 9"  (1.753 m)   Wt 216 lb (98 kg)   SpO2 98%   BMI 31.90 kg/m   Physical Exam:  Well appearing NAD HEENT: Unremarkable Neck:  No JVD, no thyromegally Lymphatics:  No adenopathy Back:  No CVA tenderness Lungs:  Clear with no wheezes HEART:  Regular rate rhythm, no murmurs, no rubs, no clicks Abd:  soft, positive bowel sounds, no organomegally, no rebound, no guarding Ext:  2 plus pulses, no edema, no cyanosis, no clubbing Skin:  No rashes no nodules Neuro:  CN II through XII intact, motor grossly intact  EKG - atrial fib with ventricular pacing  DEVICE  Normal device function.  See PaceArt for details.   Assess/Plan: 1. Persistent atrial fib - we discussed treatment options. As we are not planning to pursue NSR, I have asked him to reduce his dose of amiodarone to 100 mg daily. 2. 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 I have asked him to decrease the amiodarone.  3. Chronic systolic heart failure - his symptoms are class 2. No change in  his meds. He is encouraged to participate in cardiac rehab and will maintain a low sodium diet. 4. HTN - his bp is mildly elevated. He will maintain a low sodium diet.  Eric [name].D.

## 2019-06-10 ENCOUNTER — Telehealth: Payer: Self-pay

## 2019-06-10 NOTE — Telephone Encounter (Signed)
**Note De-Identified Orlanda Lemmerman Obfuscation** We received a PA request from [facility] on the pts Amiodarone. I called [facility] to get the info from the card that the RX was ran under. I was advised that a PA is not required and that the pt picked up a 90 day supply of Amiodarone on 06/08/2019 and paid $4 for it.

## 2019-06-23 ENCOUNTER — Other Ambulatory Visit: Payer: Self-pay

## 2019-06-23 MED ORDER — LEVOTHYROXINE SODIUM 75 MCG PO TABS: 75.0000 ug | ORAL_TABLET | ORAL | 0 refills | Status: DC

## 2019-07-01 DIAGNOSIS — Z23 Encounter for immunization: Secondary | ICD-10-CM | POA: Diagnosis not present

## 2019-07-22 IMAGING — CT CT ANGIO CHEST
2 of 7 series · 17 of 46 positions shown · IV contrast (iopamidol)
Comparison: 02/13/2016

CLINICAL DATA: Pt having aortic valve replacement at [facility]. Not yet
scheduled. Eval aorta and arch vessels. Stage 3 kid disease. Bun 25
CRE 1.88. GFR 42. Hx of low EF.

EXAM:
CT ANGIOGRAPHY CHEST WITH CONTRAST
TECHNIQUE: Multidetector CT imaging of the chest was performed using the
standard protocol during bolus administration of intravenous
contrast. Multiplanar CT image reconstructions and MIPs were
obtained to evaluate the vascular anatomy.
CONTRAST:  100mL 7Q34YY-D6O IOPAMIDOL (7Q34YY-D6O) INJECTION 76%;
<See Chart> 7Q34YY-D6O IOPAMIDOL (7Q34YY-D6O) INJECTION 76%, 100mL
7Q34YY-D6O IOPAMIDOL (7Q34YY-D6O) INJECTION 76%

[Series 6: aorta 3.0 i31f 2 · axial · 0.77mm/px · z∈[-356,-53]mm · 14 of 111 slices shown]
[im 5/111  lung]
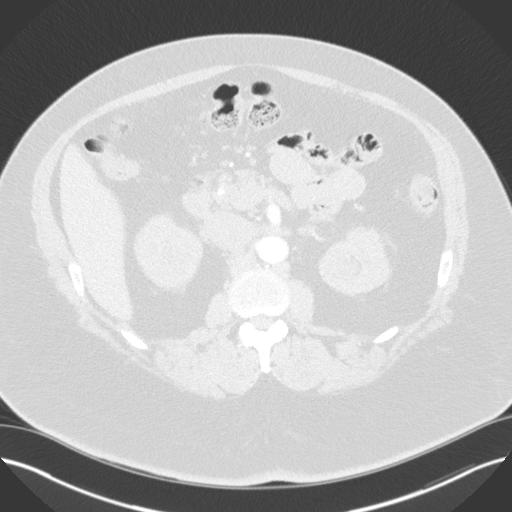
[im 14/111  soft-tissue]
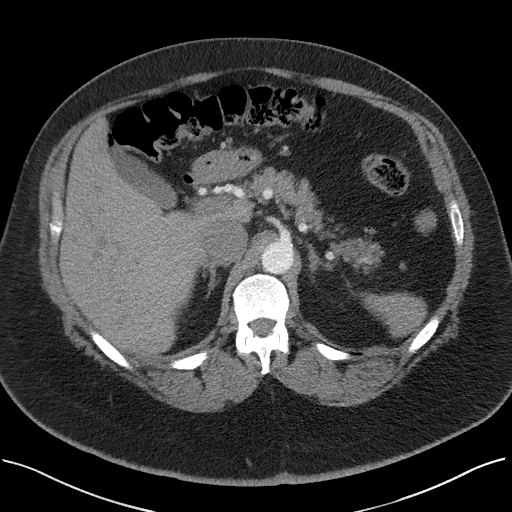
[im 23/111  lung]
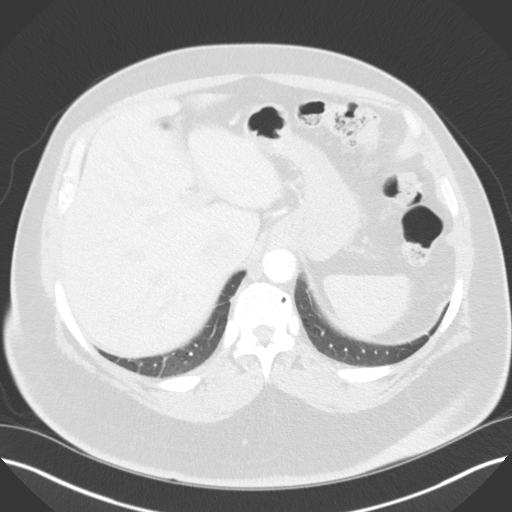
[im 31/111  soft-tissue]
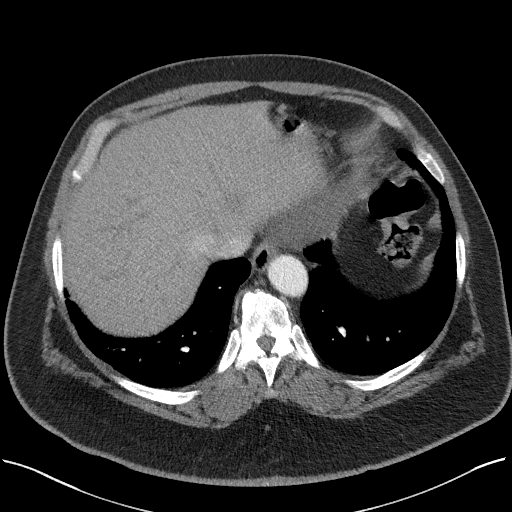
[im 36/111  lung]
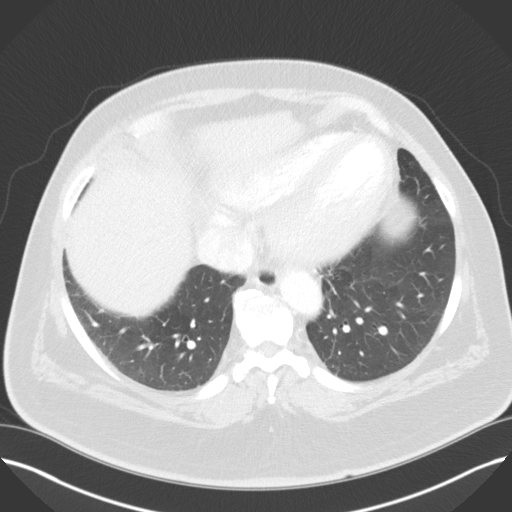
[im 45/111  soft-tissue]
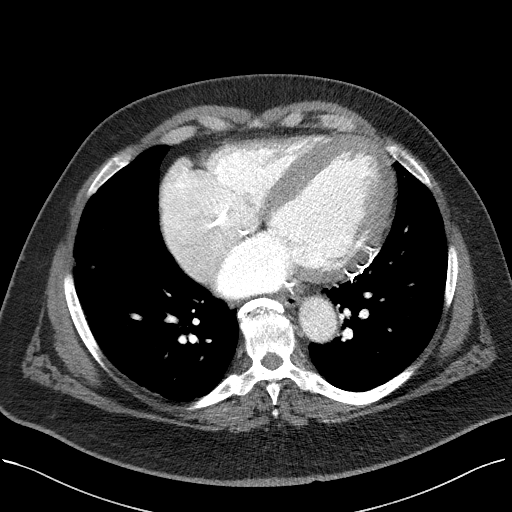
[im 53/111  lung]
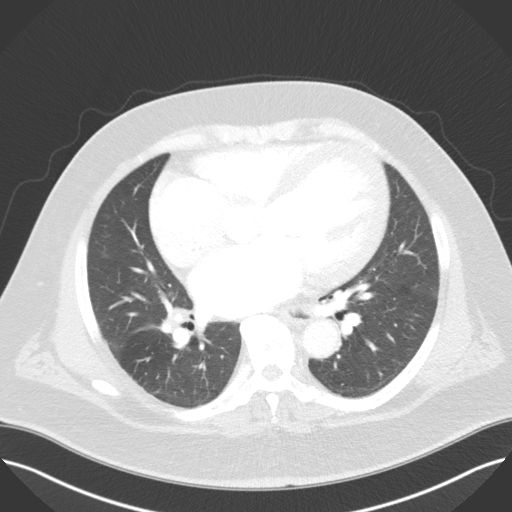
[im 58/111  soft-tissue]
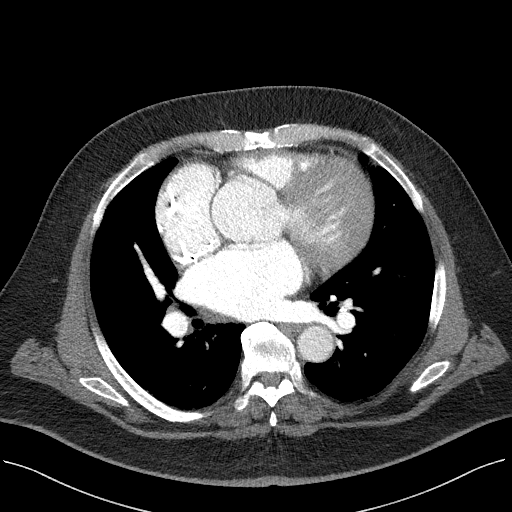
[im 67/111  lung]
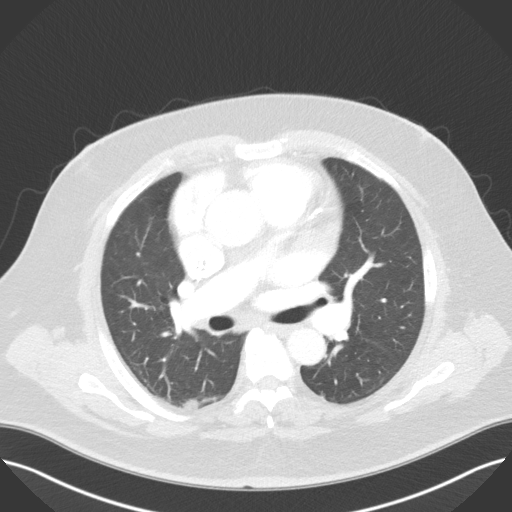
[im 75/111  soft-tissue]
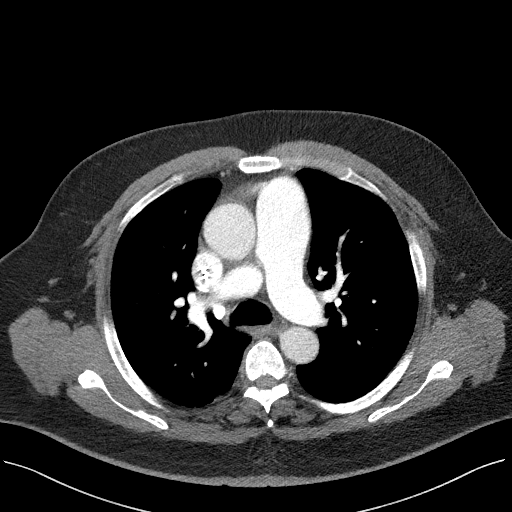
[im 84/111  lung]
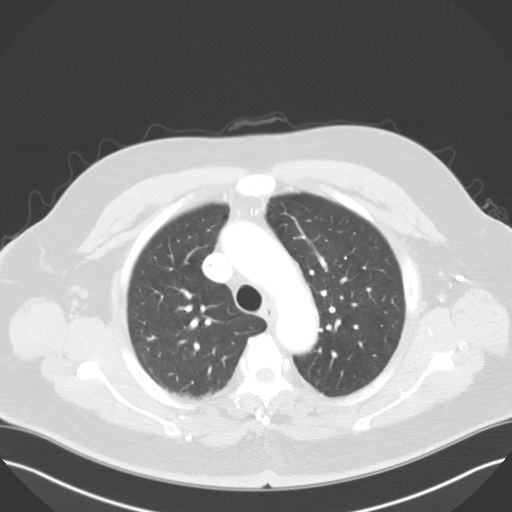
[im 89/111  soft-tissue]
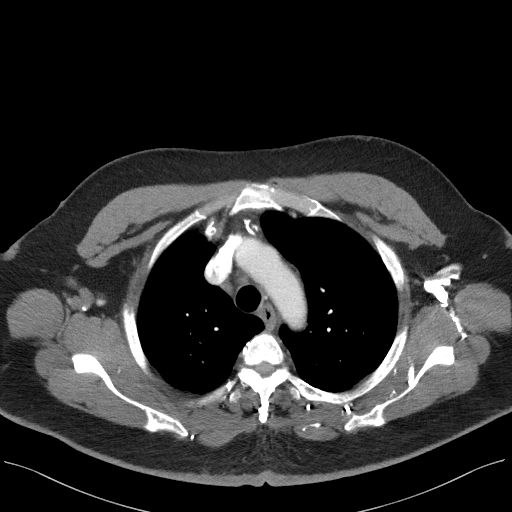
[im 97/111  lung]
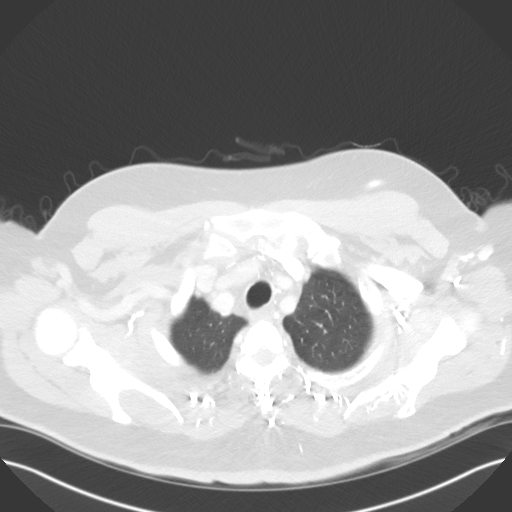
[im 106/111  soft-tissue]
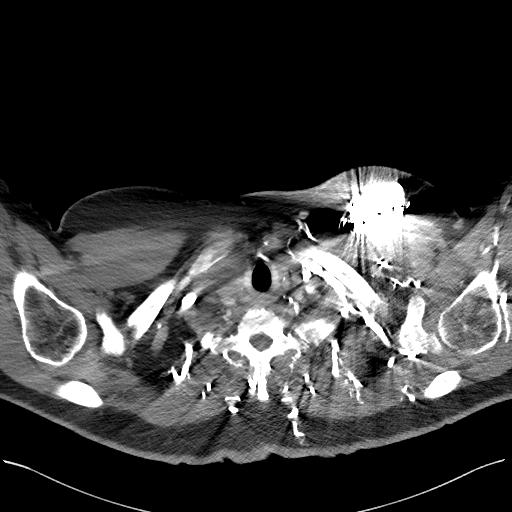

[Series 9: coronals · coronal · 0.66mm/px · 3 of 145 slices shown]
[im 37/145  soft-tissue]
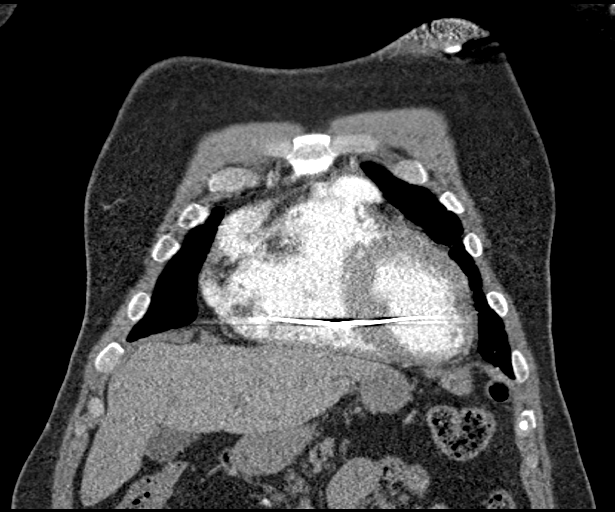
[im 73/145  soft-tissue]
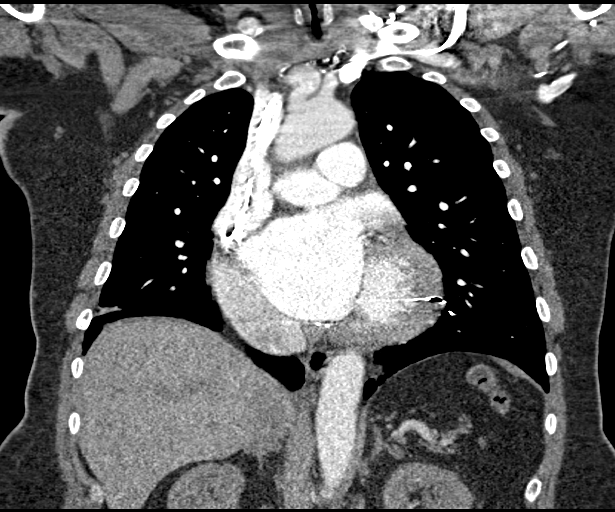
[im 109/145  soft-tissue]
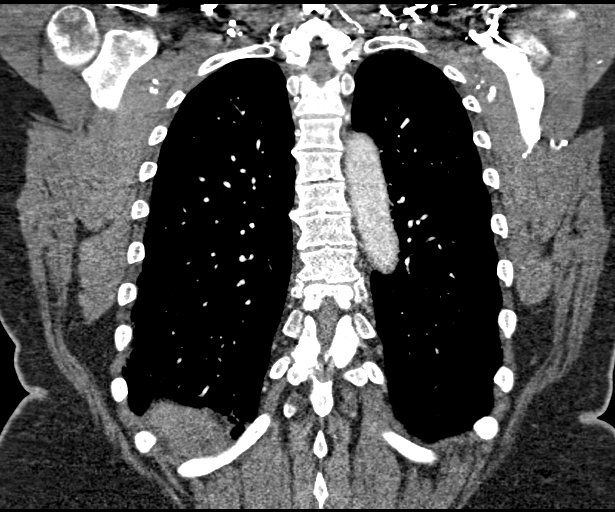

[17 of 46 positions shown; findings below may reference images not displayed]

FINDINGS: Cardiovascular: Left subclavian transvenous pacemaker with leads
extending into the coronary sinus, right atrium, and towards the
right ventricular apex. Left subclavian vein stenosis with some
filling of body wall collaterals from left arm injection. Innominate
vein and SVC patent. Mild four-chamber cardiac enlargement. No
pericardial effusion. The RV is nondilated. Dilated central
pulmonary arteries. Satisfactory opacification of pulmonary arteries
noted, and there is no evidence of pulmonary emboli.

Scattered coronary calcifications.

Good contrast opacification of the thoracic aorta with transverse
diameters as follows:

5.6 cm sinuses of Valsalva

4.5 cm Arm[place] junction

4.3 cm proximal ascending

3.7 cm distal ascending

3.1 cm distal arch

3.3 cm proximal descending

2.7 cm distal descending

No dissection or stenosis. Classic 3 vessel brachiocephalic arterial
origin anatomy without significant stenosis. Mild nonocclusive
calcified plaque at the left subclavian artery origin. Minimal
calcified plaque in the distal arch. Visualized proximal abdominal
aorta unremarkable.

Mediastinum/Nodes: No hilar or mediastinal adenopathy.

Lungs/Pleura: No pleural effusion. No pneumothorax. Lungs are clear.

Upper Abdomen: No acute findings.

Musculoskeletal: Anterior vertebral endplate spurring at multiple
levels in the mid and lower thoracic spine. No fracture or worrisome
bone lesion.

Review of the MIP images confirms the above findings.
IMPRESSION: 1. Dilated aortic root and 4.3 cm proximal ascending thoracic aortic
aneurysm without complicating features. Recommend annual imaging
followup by CTA or MRA. This recommendation follows 6616
ACCF/AHA/AATS/ACR/ASA/SCA/ROSEMAN/ALBERTICO/TREYDER/JIMI Guidelines for the
Diagnosis and Management of Patients with Thoracic Aortic Disease.
Circulation. 6616; 121: [id number]
2. Coronary and Aortic Atherosclerosis (RUI7N-170.0).
3. Cardiomegaly

## 2019-08-01 ENCOUNTER — Ambulatory Visit: Payer: Medicare Other | Admitting: Podiatry

## 2019-08-08 ENCOUNTER — Ambulatory Visit (INDEPENDENT_AMBULATORY_CARE_PROVIDER_SITE_OTHER): Payer: Medicare Other | Admitting: Podiatry

## 2019-08-08 ENCOUNTER — Other Ambulatory Visit: Payer: Self-pay

## 2019-08-08 DIAGNOSIS — M79675 Pain in left toe(s): Secondary | ICD-10-CM

## 2019-08-08 DIAGNOSIS — B351 Tinea unguium: Secondary | ICD-10-CM

## 2019-08-08 DIAGNOSIS — E119 Type 2 diabetes mellitus without complications: Secondary | ICD-10-CM

## 2019-08-08 DIAGNOSIS — L608 Other nail disorders: Secondary | ICD-10-CM

## 2019-08-08 DIAGNOSIS — D689 Coagulation defect, unspecified: Secondary | ICD-10-CM

## 2019-08-08 DIAGNOSIS — M79674 Pain in right toe(s): Secondary | ICD-10-CM

## 2019-08-10 NOTE — Progress Notes (Signed)
Cardiology Office Note:    Date:  08/11/2019   ID:  Eric [name]., Eric [name], DOB July 23, 1951, MRN [id number]  PCP:  Eric [name], Eric [name]  Cardiologist:  Eric [name], Eric [name]   Referring Eric [name]: Eric [name], Eric [name]   Chief Complaint  Patient presents with  . Coronary Artery Disease  . Cardiac Valve Problem    History of Present Illness:    Eric [name]., Eric [name] is a 68 y.o. male with a hx of severe aortic regurgitation, valve induced left ventricular systolic dysfunction/nonischemic cardiomyopathy, CKD stage IV, essential hypertension, type 2 diabetes, prior history of cardiac arrest due to ventricular fibrillation, CAD without angina with known significant diagonal stenosis, and November 20, 2018 underwent aortic valve replacementand aortic root replacementusing bioprosthetic valve/aortic graftandsingle-vessel coronary graftingwith saphenous vein. Subsequent sternal wound infection requiring sternal flap and prolonged antibiotics.  [name] is doing relatively well.  He has undergone significant aortic, aortic valve, and sternal debridement procedures to repair significant aortic regurgitation and an aortic aneurysm.  Post surgery he developed sternal/mediastinal wound infection.  He has had successful rehab and is now back at work 3 days/week.  His current medical regimen has been trimmed.  He has gained weight since surgery now weighing 224 pounds compared with 216 pounds in September 2020.  He denies orthopnea, PND, lower extremity swelling, angina, syncope, and bleeding on anticoagulation therapy.  Past Medical History:  Diagnosis Date  . Acute blood loss anemia   . Acute encephalopathy   . Acute hypoxemic respiratory failure ([place])   . Acute idiopathic gout of right ankle   . Acute lumbar back pain   . Acute on chronic systolic heart failure ([place])   . Acute respiratory failure ([place])   . AKI (acute kidney injury) ([place])   . Altered mental status   . Aortic aneurysm without rupture ([place])  02/09/2016  . Aortic root enlargement ([place]) 02/13/2012  . Aortic valve regurgitation, acquired 02/13/2012  . Arrhythmia   . Atrial flutter ([place]) 03/18/2012  . Back pain   . Cardiac arrest ([place]) 02/01/2016  . CHF (congestive heart failure) ([place])   . Chronic anticoagulation 03/04/2013  . Chronic kidney disease    kidney fx studies increased   . Chronic lower back pain   . Chronic renal insufficiency   . Chronic systolic heart failure ([place]) 02/13/2012   Recent diagnosis 4 / 2013, LVEF 25% by Echo 12/2011  03/2013: Echo at [facility] Cardiology Conclusions: 1. Left ventricular ejection fraction estimated by 2D at 40-45 percent. 2. Mild concentric left ventricular hypertrophy. 3. Mild left atrial enlargement. 4. Moderate aortic valve regurgitation. 5. The aortic root at the sinus(es) of valsalva is moderately dilated 6. Mild mitral valve regurgitation. 7.  . CKD (chronic kidney disease)   . CKD (chronic kidney disease), stage IV ([place]) 08/06/2013   Creatinine 2.4 on 07/04/13   . Claustrophobia   . Colon cancer screening 03/04/2013  . Debility 02/22/2016  . Diabetes mellitus type 2 in nonobese (HCC)   . Diabetes mellitus type 2 in obese ([place])   . Dysrhythmia    "palpitations"  . Encounter for central line placement   . Exertional dyspnea 01/2012  . 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 HCAP (healthcare-associated pneumonia)   . Heart murmur   . Hyperlipidemia 02/13/2012  . Hypertension   . Hypothyroidism   . Internal hemorrhoids without mention of complication 8/54/6270  . Labile blood pressure   . Left bundle branch block 02/13/2012  .  Leg weakness, bilateral   . Long term (current) use of anticoagulants 08/06/2013   Eliquis therapy   . Long term current use of amiodarone 08/07/2016  . Lower extremity weakness   . Migraine 02/13/12   "opthalmic"  . Non-traumatic rhabdomyolysis   . Obesity (BMI 30-39.9) 02/13/2012  . Pain   . Paroxysmal atrial fibrillation (HCC)   . Pneumonia    . Retroperitoneal bleed   . Right ankle pain   . Right knee pain   . Severe aortic regurgitation 02/09/2016  . Special screening for malignant neoplasms, colon 04/11/2013  . Thyroid activity decreased   . Tibial pain   . Varicose vein of leg    right  . Ventricular fibrillation ([place]) 02/09/2016  . Weakness of both lower extremities     Past Surgical History:  Procedure Laterality Date  . AORTIC VALVE REPLACEMENT  11/20/2018  . CARDIAC CATHETERIZATION N/A 02/17/2016   Procedure: Left Heart Cath and Coronary Angiography;  Surgeon: Eric [name], Eric [name];  Location: [place] CV LAB;  Service: Cardiovascular;  Laterality: N/A;  . CARDIOVERSION  03/22/2012   Procedure: CARDIOVERSION;  Surgeon: Eric [name], Eric [name];  Location: [facility] ENDOSCOPY;  Service: Cardiovascular;  Laterality: N/A;  . CARDIOVERSION  04/19/2012   Procedure: CARDIOVERSION;  Surgeon: Eric [name], Eric [name];  Location: [place];  Service: Cardiovascular;  Laterality: N/A;  . COLONOSCOPY N/A 04/11/2013   Procedure: COLONOSCOPY;  Surgeon: Eric [name], Eric [name];  Location: [facility] ENDOSCOPY;  Service: Endoscopy;  Laterality: N/A;  . COLONOSCOPY N/A 04/11/2013   Procedure: COLONOSCOPY;  Surgeon: Eric [name], Eric [name];  Location: [facility] ENDOSCOPY;  Service: Endoscopy;  Laterality: N/A;  . EP IMPLANTABLE DEVICE N/A 02/17/2016   Procedure: BiV ICD Insertion CRT-D;  Surgeon: Eric [name], Eric [name];  Location: [place] CV LAB;  Service: Cardiovascular;  Laterality: N/A;  . FINGER SURGERY  2012   "4th digit right hand; thumb on left hand"  . RADIOLOGY WITH ANESTHESIA N/A 02/11/2016   Procedure: MRI OF THE BRAIN WITHOUT CONTRAST, LUMBAR WITHOUT CONTRAST;  Surgeon: Eric [name], Eric [name];  Location: [place];  Service: Radiology;  Laterality: N/A;  DR. WOOD/MRI  . RIGHT/LEFT HEART CATH AND CORONARY ANGIOGRAPHY N/A 07/26/2018   Procedure: RIGHT/LEFT HEART CATH AND CORONARY ANGIOGRAPHY;  Surgeon: Eric [name], Eric [name];  Location: [place] CV LAB;  Service:  Cardiovascular;  Laterality: N/A;  . Skin melanocytoma excision  2012   "above left clavicle"  . STERNAL INCISION RECLOSURE  11/2018  . STERNAL WIRE REMOVAL  11/2018  . STERNAL WOUND DEBRIDEMENT  11/2018  . TEE WITHOUT CARDIOVERSION  03/22/2012   Procedure: TRANSESOPHAGEAL ECHOCARDIOGRAM (TEE);  Surgeon: Eric [name], Eric [name];  Location[facility] ENDOSCOPY;  Service: Cardiovascular;  Laterality: N/A;  . TEE WITHOUT CARDIOVERSION N/A 02/08/2016   Procedure: TRANSESOPHAGEAL ECHOCARDIOGRAM (TEE);  Surgeon: Lelon Perla, Eric [name];  Location: [facility] ENDOSCOPY;  Service: Cardiovascular;  Laterality: N/A;    Current Medications: Current Meds  Eric Sig  . acetaminophen (TYLENOL) 500 MG tablet Take 1,500 mg by mouth every 4 (four) hours as needed for moderate pain or headache.   Marland Kitchen amiodarone (PACERONE) 100 MG tablet Take 1 tablet (100 mg total) by mouth daily.  Marland Kitchen apixaban (ELIQUIS) 5 MG TABS tablet Take 1 tablet (5 mg total) by mouth 2 (two) times daily.  Marland Kitchen aspirin 81 MG chewable tablet Chew 81 mg by mouth daily.  . carvedilol (COREG) 6.25 MG tablet Take 1 tablet (6.25 mg total) by mouth 2 (two) times daily.  Marland Kitchen  docusate sodium (COLACE) 100 MG capsule Take 100 mg by mouth daily as needed for mild constipation.  . fluticasone (FLONASE) 50 MCG/ACT nasal spray Place 2 sprays into the nose as needed for allergies.   . furosemide (LASIX) 40 MG tablet Take 1.5 tablets (60 mg total) by mouth daily.  . Hypromellose (ARTIFICIAL TEARS OP) Place 1 drop into both eyes daily as needed (dry eyes).  Marland Kitchen ipratropium (ATROVENT) 0.06 % nasal spray Place 2 sprays into both nostrils 2 (two) times daily.  Marland Kitchen levothyroxine (SYNTHROID) 75 MCG tablet Take 1 tablet (75 mcg total) by mouth See admin instructions. Take 75 mcg daily in the morning except on Sundays take 37.5 mcg in the morning  . potassium chloride 20 MEQ/15ML (10%) SOLN Take 15 mLs (20 mEq total) by mouth daily.  . rosuvastatin (CRESTOR) 20 MG tablet TAKE 1 TABLET BY MOUTH  DAILY  . vitamin B-12 (CYANOCOBALAMIN) 100 MCG tablet Take 100 mcg by mouth daily.   . vitamin C (ASCORBIC ACID) 500 MG tablet Take 500 mg by mouth as needed.      Allergies:   Lactose intolerance (gi)   Social History   Socioeconomic History  . Marital status: Married    Spouse name: [name]  . Number of children: 1  . Years of education: Not on file  . Highest education level: Not on file  Occupational History    Employer: [employer]  Social Needs  . Financial resource strain: Not hard at all  . Food insecurity    Worry: Never true    Inability: Never true  . Transportation needs    Medical: No    Non-medical: No  Tobacco Use  . Smoking status: Never Smoker  . Smokeless tobacco: Never Used  Substance and Sexual Activity  . Alcohol use: Yes    Comment: rarely  . Drug use: No  . Sexual activity: Not Currently  Lifestyle  . Physical activity    Days per week: 0 days    Minutes per session: 0 min  . Stress: To some extent  Relationships  . Social Herbalist on phone: Not on file    Gets together: Not on file    Attends religious service: Not on file    Active member of club or organization: Not on file    Attends meetings of clubs or organizations: Not on file    Relationship status: Not on file  Other Topics Concern  . 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     Family History: The patient's family history includes Diabetes in his mother; Heart disease in his father and mother; Heart failure in his father and mother; Hypertension in his father and mother.  ROS:   Please see the history of present illness.    He has some numbness and discomfort in the sternal area.  Otherwise no particular complaints.  All other systems reviewed and are negative.  EKGs/Labs/Other Studies Reviewed:    The following studies were reviewed today: No new data  EKG:  EKG not  performed today.  Recent Labs: 12/05/2018: NT-Pro BNP 4,691 05/13/2019: ALT 45; BUN 27; Creatinine, Ser 2.07; Hemoglobin 9.2; Platelets 313; Potassium 3.9; Sodium 136; TSH 3.430  Recent Lipid Panel    Component Value Date/Time   CHOL 121 05/13/2019 1549   TRIG 48 05/13/2019 1549   HDL 49 05/13/2019 1549  CHOLHDL 2.5 05/13/2019 1549   CHOLHDL 3.8 02/13/2012 1829   VLDL 30 02/13/2012 1829   LDLCALC 62 05/13/2019 1549    Physical Exam:    VS:  BP (!) 148/94   Pulse 70   Ht 5\' 9"  (1.753 m)   Wt 224 lb (101.6 kg)   SpO2 98%   BMI 33.08 kg/m     Wt Readings from Last 3 Encounters:  08/11/19 224 lb (101.6 kg)  06/03/19 216 lb (98 kg)  05/13/19 218 lb 12.8 oz (99.2 kg)     GEN: Moderate obesity. No acute distress HEENT: Normal NECK: A significant CV wave is noted in the right jugular region. LYMPHATICS: No lymphadenopathy CARDIAC:  RRR with 1/6 to 2/6 systolic left mid and right upper sternal murmur, but no gallop, or edema. VASCULAR:  Normal Pulses. No bruits. RESPIRATORY:  Clear to auscultation without rales, wheezing or rhonchi  ABDOMEN: Soft, non-tender, non-distended, No pulsatile mass, MUSCULOSKELETAL: No deformity  SKIN: Warm and dry NEUROLOGIC:  Alert and oriented x 3 PSYCHIATRIC:  Normal affect   ASSESSMENT:    1. S/P AVR (aortic valve replacement)   2. Chronic systolic heart failure (HCC)   3. Persistent atrial fibrillation ([place])   4. Long term current use of amiodarone   5. Long term current use of anticoagulant   6. Essential hypertension   7. Mixed hyperlipidemia   8. S/P ascending aortic aneurysm repair   9. Diabetes mellitus type 2 in nonobese (HCC)   10. 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 Educated about COVID-19 virus infection    PLAN:    In order of problems listed above:  1. Normal valve function based upon auscultation and overall clinical status. 2. No obvious volume overload.  Lungs are clear and there  is trace ankle edema.  A 2D Doppler echocardiogram needs to be done to establish a baseline for future reference.  Current LVEF is unknown. 3. Clinically present and stable without excessive heart rate 4. Amiodarone has been decreased to 100 mg/day.  I will probably discontinue this Eric on return. 5. Eliquis therapy without bleeding. 6. Blood pressure is too high.  Start BiDil 37.5/20 mg twice daily.  Will likely need to uptitrate to 3 times daily. 7. Hyperlipidemia with adequate LDL control, 62, in August 2020. 8. Status post ascending aortic aneurysm repair. 9. A1c target less than 7. 10. BiV defibrillator in place.  Recently evaluated by Dr. Lovena Le. 11. Observing the 3W's.  Guideline directed therapy for left ventricular systolic dysfunction: Angiotensin receptor-neprilysin inhibitor (ARNI)-Entresto; beta-blocker therapy - carvedilol or metoprolol succinate; mineralocorticoid receptor antagonist (MRA) therapy -spironolactone or eplerenone.  These therapies have been shown to improve clinical outcomes including reduction of rehospitalization survival, and acute heart failure.   Unable to use angiotensin converting enzyme/ARB therapy related to CKD.   Eric Adjustments/Labs and Tests Ordered: Current medicines are reviewed at length with the patient today.  Concerns regarding medicines are outlined above.  No orders of the defined types were placed in this encounter.  Meds ordered this encounter  Medications  . isosorbide-hydrALAZINE (BIDIL) 20-37.5 MG tablet    Sig: Take 1 tablet by mouth daily.    Dispense:  90 tablet    Refill:  3    Patient Instructions  Eric Instructions:  1) START Bidil one tablet daily  *If you need a refill on your cardiac medications before your next appointment, please call your pharmacy*  Lab Work: None If you have labs (blood  work) drawn today and your tests are completely normal, you will receive your results only by: Marland Kitchen MyChart  Message (if you have MyChart) OR . A paper copy in the mail If you have any lab test that is abnormal or we need to change your treatment, we will call you to review the results.  Testing/Procedures: None  Follow-Up:  Your physician recommends that you schedule a follow-up appointment in: 2-3 weeks with a PA or NP.   At [facility], you and your health needs are our priority.  As part of our continuing mission to provide you with exceptional heart care, we have created designated Provider Care Teams.  These Care Teams include your primary Cardiologist (physician) and Advanced Practice Providers (APPs -  Physician Assistants and Nurse Practitioners) who all work together to provide you with the care you need, when you need it.  Your next appointment:   3 months  The format for your next appointment:   In Person  Provider:   You may see Eric [name], Eric [name] or one of the following Advanced Practice Providers on your designated Care Team:    Truitt Merle, NP  Cecilie Kicks, NP  Kathyrn Drown, NP   Other Instructions      Signed, Eric [name], Eric [name]  08/11/2019 5:12 PM    [place]

## 2019-08-11 ENCOUNTER — Other Ambulatory Visit: Payer: Self-pay

## 2019-08-11 ENCOUNTER — Encounter: Payer: Medicare Other | Admitting: *Deleted

## 2019-08-11 ENCOUNTER — Ambulatory Visit (INDEPENDENT_AMBULATORY_CARE_PROVIDER_SITE_OTHER): Payer: Medicare Other | Admitting: Interventional Cardiology

## 2019-08-11 ENCOUNTER — Encounter: Payer: Self-pay | Admitting: Interventional Cardiology

## 2019-08-11 VITALS — BP 148/94 | HR 70 | Ht 69.0 in | Wt 224.0 lb

## 2019-08-11 DIAGNOSIS — Z952 Presence of prosthetic heart valve: Secondary | ICD-10-CM

## 2019-08-11 DIAGNOSIS — E782 Mixed hyperlipidemia: Secondary | ICD-10-CM

## 2019-08-11 DIAGNOSIS — Z7901 Long term (current) use of anticoagulants: Secondary | ICD-10-CM

## 2019-08-11 DIAGNOSIS — Z95810 Presence of automatic (implantable) cardiac defibrillator: Secondary | ICD-10-CM

## 2019-08-11 DIAGNOSIS — I4819 Other persistent atrial fibrillation: Secondary | ICD-10-CM

## 2019-08-11 DIAGNOSIS — Z9889 Other specified postprocedural states: Secondary | ICD-10-CM | POA: Diagnosis not present

## 2019-08-11 DIAGNOSIS — Z79899 Other long term (current) drug therapy: Secondary | ICD-10-CM | POA: Diagnosis not present

## 2019-08-11 DIAGNOSIS — Z8679 Personal history of other diseases of the circulatory system: Secondary | ICD-10-CM | POA: Diagnosis not present

## 2019-08-11 DIAGNOSIS — I5022 Chronic systolic (congestive) heart failure: Secondary | ICD-10-CM | POA: Diagnosis not present

## 2019-08-11 DIAGNOSIS — Z7189 Other specified counseling: Secondary | ICD-10-CM | POA: Diagnosis not present

## 2019-08-11 DIAGNOSIS — E119 Type 2 diabetes mellitus without complications: Secondary | ICD-10-CM

## 2019-08-11 DIAGNOSIS — I110 Hypertensive heart disease with heart failure: Secondary | ICD-10-CM

## 2019-08-11 DIAGNOSIS — I10 Essential (primary) hypertension: Secondary | ICD-10-CM

## 2019-08-11 MED ORDER — BIDIL 20-37.5 MG PO TABS: 1.0000 | ORAL_TABLET | Freq: Every day | ORAL | 3 refills | Status: DC

## 2019-08-11 NOTE — Patient Instructions (Signed)
Medication Instructions:  1) START Bidil one tablet daily  *If you need a refill on your cardiac medications before your next appointment, please call your pharmacy*  Lab Work: None If you have labs (blood work) drawn today and your tests are completely normal, you will receive your results only by: Marland Kitchen MyChart Message (if you have MyChart) OR . A paper copy in the mail If you have any lab test that is abnormal or we need to change your treatment, we will call you to review the results.  Testing/Procedures: None  Follow-Up:  Your physician recommends that you schedule a follow-up appointment in: 2-3 weeks with a PA or NP.   At [facility], you and your health needs are our priority.  As part of our continuing mission to provide you with exceptional heart care, we have created designated Provider Care Teams.  These Care Teams include your primary Cardiologist (physician) and Advanced Practice Providers (APPs -  Physician Assistants and Nurse Practitioners) who all work together to provide you with the care you need, when you need it.  Your next appointment:   3 months  The format for your next appointment:   In Person  Provider:   You may see Sinclair Grooms, MD or one of the following Advanced Practice Providers on your designated Care Team:    Truitt Merle, NP  Cecilie Kicks, NP  Kathyrn Drown, NP   Other Instructions

## 2019-08-13 ENCOUNTER — Encounter: Payer: Self-pay | Admitting: Podiatry

## 2019-08-13 NOTE — Progress Notes (Signed)
  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 Nail Problem    pt is here for nail trim, pt is also currently on eliquis   68 y.o. male returns for the above complaint.  Patient states toenails have been painful.  He is here to have them trimmed down.  Patient states they are elongated thickened mycotic and pain with ambulation.  He has not tried anything to help alleviate his pain.  Ambulates in regular sneakers.  He denies any other acute complaints.  Objective:  There were no vitals filed for this visit. Podiatric Exam: Vascular: dorsalis pedis and posterior tibial pulses are palpable bilateral. Capillary return is immediate. Temperature gradient is WNL. Skin turgor WNL  Sensorium: Normal Semmes Weinstein monofilament test. Normal tactile sensation bilaterally. Nail Exam: Pt has thick disfigured discolored nails with subungual debris noted bilateral entire nail hallux through fifth toenails Ulcer Exam: There is no evidence of ulcer or pre-ulcerative changes or infection. Orthopedic Exam: Muscle tone and strength are WNL. No limitations in general ROM. No crepitus or effusions noted. HAV  B/L.  Hammer toes 2-5  B/L. Skin: No Porokeratosis. No infection or ulcers  Assessment & Plan:  Patient was evaluated and treated and all questions answered.  Onychomycosis with pain  -Nails palliatively debrided as below. -Educated on self-care  Procedure: Nail Debridement Rationale: pain  Type of Debridement: manual, sharp debridement. Instrumentation: Nail nipper, rotary burr. Number of Nails: 10  Procedures and Treatment: Consent by patient was obtained for treatment procedures. The patient understood the discussion of treatment and procedures well. All questions were answered thoroughly reviewed. Debridement of mycotic and hypertrophic toenails, 1 through 5 bilateral and clearing of subungual debris. No  ulceration, no infection noted.  Return Visit-Office Procedure: Patient instructed to return to the office for a follow up visit 3 months for continued evaluation and treatment.  Boneta Lucks, DPM    No follow-ups on file.

## 2019-08-28 NOTE — Progress Notes (Signed)
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 Follow-up    Seen for Dr. 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 Seen for Dr. Tamala Julian.   He has a history of severe aortic regurgitation, valve induced left ventricular systolic dysfunction/nonischemic cardiomyopathy, CKD stage IV, essential hypertension, type 2 diabetes, prior history of cardiac arrest due to ventricular fibrillation, CAD without angina with known significant diagonal stenosis. In February 2020, he underwent aortic valve replacementand aortic root replacementusing bioprosthetic valve/aortic graft withsingle-vessel coronary grafting with saphenous vein.His course was complicated by sternal wound infection - required sternal flap, prolonged antibiotics, etc.   Last seen about 3 weeks ago by Dr. Tamala Julian - doing ok - had gained weight. Was back at work. Was placed on Bidil and was to have an echo. Amiodarone was decreased to 100 mg. Not able to be on ACE/ARB/ARNI due to CKD.   The patient does not have symptoms concerning for COVID-19 infection (fever, chills, cough, or new shortness of breath).   Comes in today. Here alone. He feels like he is doing well. He is aware of his echo visit for next week. He has no chest pain. Breathing is good. BP still high. Only taking Bidil once a day - it says this on his bottle. Little dizzy sometimes after he takes. Has not been checking his BP at home - needs new cuff but planning on getting.    Past Medical History:  Diagnosis Date  . Acute blood loss anemia   . Acute encephalopathy   . Acute hypoxemic respiratory failure ([place])   . Acute idiopathic gout of right ankle   . Acute lumbar back pain   . Acute on chronic systolic heart  failure ([place])   . Acute respiratory failure ([place])   . AKI (acute kidney injury) ([place])   . Altered mental status   . Aortic aneurysm without rupture ([place]) 02/09/2016  . Aortic root enlargement ([place]) 02/13/2012  . Aortic valve regurgitation, acquired 02/13/2012  . Arrhythmia   . Atrial flutter ([place]) 03/18/2012  . Back pain   . Cardiac arrest ([place]) 02/01/2016  . CHF (congestive heart failure) ([place])   . Chronic anticoagulation 03/04/2013  . Chronic kidney disease    kidney fx studies increased   . Chronic lower back pain   . Chronic renal insufficiency   . Chronic systolic heart failure ([place]) 02/13/2012   Recent diagnosis 4 / 2013, LVEF 25% by Echo 12/2011  03/2013: Echo at [facility] Cardiology Conclusions: 1. Left ventricular ejection fraction estimated by 2D at 40-45 percent. 2. Mild concentric left ventricular hypertrophy. 3. Mild left atrial enlargement. 4. Moderate aortic valve regurgitation. 5. The aortic root at the sinus(es) of valsalva is moderately dilated 6. Mild mitral valve regurgitation. 7.  . CKD (chronic kidney disease)   . CKD (chronic kidney disease), stage IV ([place]) 08/06/2013   Creatinine 2.4 on 07/04/13   . Claustrophobia   . Colon cancer screening 03/04/2013  . Debility 02/22/2016  . Diabetes mellitus type 2 in nonobese (HCC)   . Diabetes mellitus type 2 in obese ([place])   . Dysrhythmia    "palpitations"  . Encounter for central line placement   .  Exertional dyspnea 01/2012  . 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 HCAP (healthcare-associated pneumonia)   . Heart murmur   . Hyperlipidemia 02/13/2012  . Hypertension   . Hypothyroidism   . Internal hemorrhoids without mention of complication 9/60/4540  . Labile blood pressure   . Left bundle branch block 02/13/2012  . Leg weakness, bilateral   . Long term (current) use of anticoagulants 08/06/2013   Eliquis therapy   . Long term current use of amiodarone 08/07/2016  . Lower extremity weakness   . Migraine  02/13/12   "opthalmic"  . Non-traumatic rhabdomyolysis   . Obesity (BMI 30-39.9) 02/13/2012  . Pain   . Paroxysmal atrial fibrillation (HCC)   . Pneumonia   . Retroperitoneal bleed   . Right ankle pain   . Right knee pain   . Severe aortic regurgitation 02/09/2016  . Special screening for malignant neoplasms, colon 04/11/2013  . Thyroid activity decreased   . Tibial pain   . Varicose vein of leg    right  . Ventricular fibrillation ([place]) 02/09/2016  . Weakness of both lower extremities     Past Surgical History:  Procedure Laterality Date  . AORTIC VALVE REPLACEMENT  11/20/2018  . CARDIAC CATHETERIZATION N/A 02/17/2016   Procedure: Left Heart Cath and Coronary Angiography;  Surgeon: Jettie Booze, MD;  Location: [place] [facility];  Service: Cardiovascular;  Laterality: N/A;  . CARDIOVERSION  03/22/2012   Procedure: CARDIOVERSION;  Surgeon: Candee Furbish, MD;  Location: [facility] ENDOSCOPY;  Service: Cardiovascular;  Laterality: N/A;  . CARDIOVERSION  04/19/2012   Procedure: CARDIOVERSION;  Surgeon: Sinclair Grooms, MD;  Location: [place];  Service: Cardiovascular;  Laterality: N/A;  . COLONOSCOPY N/A 04/11/2013   Procedure: COLONOSCOPY;  Surgeon: Inda Castle, MD;  Location: [facility] ENDOSCOPY;  Service: Endoscopy;  Laterality: N/A;  . COLONOSCOPY N/A 04/11/2013   Procedure: COLONOSCOPY;  Surgeon: Inda Castle, MD;  Location: [facility] ENDOSCOPY;  Service: Endoscopy;  Laterality: N/A;  . EP IMPLANTABLE DEVICE N/A 02/17/2016   Procedure: BiV ICD Insertion CRT-D;  Surgeon: Evans Lance, MD;  Location: [place] [facility];  Service: Cardiovascular;  Laterality: N/A;  . FINGER SURGERY  2012   "4th digit right hand; thumb on left hand"  . RADIOLOGY WITH ANESTHESIA N/A 02/11/2016   Procedure: MRI OF THE BRAIN WITHOUT CONTRAST, LUMBAR WITHOUT CONTRAST;  Surgeon: Medication Radiologist, MD;  Location: [facility];  Service: Radiology;  Laterality: N/A;  DR. WOOD/MRI  . RIGHT/LEFT HEART CATH AND CORONARY  ANGIOGRAPHY N/A 07/26/2018   Procedure: RIGHT/LEFT HEART CATH AND CORONARY ANGIOGRAPHY;  Surgeon: Belva Crome, MD;  Location: [place] [facility];  Service: Cardiovascular;  Laterality: N/A;  . Skin melanocytoma excision  2012   "above left clavicle"  . STERNAL INCISION RECLOSURE  11/2018  . STERNAL WIRE REMOVAL  11/2018  . STERNAL WOUND DEBRIDEMENT  11/2018  . TEE WITHOUT CARDIOVERSION  03/22/2012   Procedure: TRANSESOPHAGEAL ECHOCARDIOGRAM (TEE);  Surgeon: Candee Furbish, MD;  Location: [facility] ENDOSCOPY;  Service: Cardiovascular;  Laterality: N/A;  . TEE WITHOUT CARDIOVERSION N/A 02/08/2016   Procedure: TRANSESOPHAGEAL ECHOCARDIOGRAM (TEE);  Surgeon: Lelon Perla, MD;  Location: [facility] ENDOSCOPY;  Service: Cardiovascular;  Laterality: N/A;     Medications: Current Meds  Medication Sig  . acetaminophen (TYLENOL) 500 MG tablet Take 1,500 mg by mouth every 4 (four) hours as needed for moderate pain or headache.   Marland Kitchen amiodarone (PACERONE) 100 MG tablet Take  1 tablet (100 mg total) by mouth daily.  Marland Kitchen apixaban (ELIQUIS) 5 MG TABS tablet Take 1 tablet (5 mg total) by mouth 2 (two) times daily.  . carvedilol (COREG) 6.25 MG tablet Take 1 tablet (6.25 mg total) by mouth 2 (two) times daily.  Marland Kitchen docusate sodium (COLACE) 100 MG capsule Take 100 mg by mouth daily as needed for mild constipation.  . fluticasone (FLONASE) 50 MCG/ACT nasal spray Place 2 sprays into the nose as needed for allergies.   . furosemide (LASIX) 40 MG tablet Take 1.5 tablets (60 mg total) by mouth daily.  . Hypromellose (ARTIFICIAL TEARS OP) Place 1 drop into both eyes daily as needed (dry eyes).  Marland Kitchen ipratropium (ATROVENT) 0.06 % nasal spray Place 2 sprays into both nostrils 2 (two) times daily.  . isosorbide-hydrALAZINE (BIDIL) 20-37.5 MG tablet Take 1 tablet by mouth 2 (two) times daily.  Marland Kitchen levothyroxine (SYNTHROID) 75 MCG tablet Take 1 tablet (75 mcg total) by mouth See admin instructions. Take 75 mcg daily in the morning except on  Sundays take 37.5 mcg in the morning  . potassium chloride 20 MEQ/15ML (10%) SOLN Take 15 mLs (20 mEq total) by mouth daily.  . rosuvastatin (CRESTOR) 20 MG tablet TAKE 1 TABLET BY MOUTH DAILY  . vitamin B-12 (CYANOCOBALAMIN) 100 MCG tablet Take 100 mcg by mouth daily.   . vitamin C (ASCORBIC ACID) 500 MG tablet Take 500 mg by mouth as needed.   . [DISCONTINUED] isosorbide-hydrALAZINE (BIDIL) 20-37.5 MG tablet Take 1 tablet by mouth daily.     Allergies: Allergies  Allergen Reactions  . Lactose Intolerance (Gi) Diarrhea    Social History: The patient  reports that he has never smoked. He has never used smokeless tobacco. He reports current alcohol use. He reports that he does not use drugs.   Family History: The patient's family history includes Diabetes in his mother; Heart disease in his father and mother; Heart failure in his father and mother; Hypertension in his father and mother.   Review of Systems: Please see the history of present illness.   All other systems are reviewed and negative.   Physical Exam: VS:  BP (!) 154/96   Pulse 71   Ht 5\' 9"  (1.753 m)   Wt 224 lb (101.6 kg)   SpO2 98%   BMI 33.08 kg/m  .  BMI Body mass index is 33.08 kg/m.  Wt Readings from Last 3 Encounters:  09/02/19 224 lb (101.6 kg)  08/11/19 224 lb (101.6 kg)  06/03/19 216 lb (98 kg)    General: Pleasant. Alert and in no acute distress.   HEENT: Normal.  Neck: Supple, no JVD, carotid bruits, or masses noted.  Cardiac: Fairly regular. Systolic murmur noted.  No edema.  Respiratory:  Lungs are clear to auscultation bilaterally with normal work of breathing.  GI: Soft and nontender.  MS: No deformity or atrophy. Gait and ROM intact.  Skin: Warm and dry. Color is normal.  Neuro:  Strength and sensation are intact and no gross focal deficits noted.  Psych: Alert, appropriate and with normal affect.   LABORATORY DATA:  EKG:  EKG is not ordered today.   Lab Results  Component Value  Date   WBC 5.5 05/13/2019   HGB 9.2 (L) 05/13/2019   HCT 30.6 (L) 05/13/2019   PLT 313 05/13/2019   GLUCOSE 98 05/13/2019   CHOL 121 05/13/2019   TRIG 48 05/13/2019   HDL 49 05/13/2019   LDLCALC 62 05/13/2019  ALT 45 (H) 05/13/2019   AST 81 (H) 05/13/2019   NA 136 05/13/2019   K 3.9 05/13/2019   CL 103 05/13/2019   CREATININE 2.07 (H) 05/13/2019   BUN 27 05/13/2019   CO2 20 05/13/2019   TSH 3.430 05/13/2019   INR 1.42 02/11/2016   HGBA1C 6.8 (H) 05/13/2019     BNP (last 3 results) No results for input(s): BNP in the last 8760 hours.  ProBNP (last 3 results) Recent Labs    12/05/18 1534  PROBNP 4,691*     Other Studies Reviewed Today:  RIGHT/LEFT HEART CATH AND CORONARY ANGIOGRAPHY 07/2018  Conclusion    2nd Diag lesion is 99% stenosed.  Ost 2nd Diag lesion is 50% stenosed.  Prox LAD to Mid LAD lesion is 40% stenosed.  Ost 1st Diag to 1st Diag lesion is 25% stenosed.  Ramus lesion is 25% stenosed.  LV end diastolic pressure is moderately elevated.  Hemodynamic findings consistent with moderate pulmonary hypertension.  Ost Cx lesion is 40% stenosed.    Left dominant coronary anatomy.  Short left main or separate ostia of LAD and circumflex.  The LAD is widely patent with diffuse 30 to 40% mid vessel plaque.  Subtotally occluded branch of the second diagonal unchanged from prior coronary angiography.  Ostial 30-40% circumflex after the large first marginal or ramus branch origin.  The right coronary artery is nondominant and contains no significant obstruction.  Moderate pulmonary hypertension with mean PA pressure 43 mmHg, recorded PA pressure of 61/29 mmHg.  Pulmonary capillary wedge pressure mean 30 mmHg (the patient was prehydrated due to CKD and had not had furosemide on the day of procedure).  LVEDP 25 mmHg.  Left ventriculography not performed  Aortography not performed  RECOMMENDATIONS:   We will forward data to Dr[facility] Evelina Dun at  [facility]   Echo Study Conclusions 03/2018  - Left ventricle: The cavity size was severely dilated. Wall   thickness was normal. Systolic function was severely reduced. The   estimated ejection fraction was in the range of 25% to 30%.   Diffuse hypokinesis. Grade 3 (restrictive) diastolic function   with high LV filling pressure. - Aortic valve: Trileaflet. Sclerosis without stenosis. There was   severe, posteriorly-directed regurgitation toward the anterior   mitral leaflet. Regurgitation pressure half-time: 314 ms. - Aorta: Dilated aorta. Aortic root dimension: 49 mm (ED).   Ascending aortic diameter: 42 mm (S). - Mitral valve: Mildly thickened leaflets . There was mild   regurgitation. - Left atrium: Severely dilated. - Right ventricle: AICD wire noted in right ventricle. - Right atrium: Severely dilated. AICD wire noted in right atrium. - Tricuspid valve: There was mild regurgitation. - Pulmonary arteries: PA peak pressure: 44 mm Hg (S). - Inferior vena cava: The vessel was dilated. The respirophasic   diameter changes were blunted (< 50%), consistent with elevated   central venous pressure.  Impressions:  - Compared to a prior study in 2017, there is now likely severe AI.   The LVEF is lower at 25-30% and the LV cavity is severely dilated   at 7 cm. Consider further evaluation of AI by TEE and evaluation   for aortic valve replacement.  ASSESSMENT & PLAN:    1. Prior AVR/ascending aorta repair with CABG x 1 - at [place] [facility] with Dr. Evelina Dun back in 10/2018 - he is to have a follow up echo - this is for next week - doing well clinically. Back to work.   2.  Chronic systolic HF - not able to be on ACE/ARB/ARNI due to CKD - placed on Bidil BID at last visit - only taking once a day - increasing to BID. Still hopeful to go to TID at some point.    3. Persistent AF - on amiodarone - on anticoagulation - has underlying BiV/ICD - followed by EP - noted no plans to  restore NSR. Dose of Amiodarone was cut back in September. On anticoagulation with Eliquis.   4. Prior VT/VF arrest - on low dose amiodarone - has ICD in place - followed by EP.   5. DM - per PCP  6. Underlying BiV ICD - followed by EP - no shocks reported.   7. Chronic anticoagulation - needs follow up labs.   8. HLD - on statin  9. COVID-19 Education: The signs and symptoms of COVID-19 were discussed with the patient and how to seek care for testing (follow up with PCP or arrange E-visit).  The importance of social distancing, staying at home, hand hygiene and wearing a mask when out in public were discussed today.  Current medicines are reviewed with the patient today.  The patient does not have concerns regarding medicines other than what has been noted above.  The following changes have been made:  See above.  Labs/ tests ordered today include:    Orders Placed This Encounter  Procedures  . Basic metabolic panel  . CBC no Diff  . Hepatic function panel  . TSH     Disposition:   FU with Dr. Tamala Julian in about 4 to 6 weeks. Increasing BiDil to BID today. Echo next week.    Patient is agreeable to this plan and will call if any problems develop in the interim.   SignedTruitt Merle, NP  09/02/2019 2:38 PM  [facility] [address] [place], [place]  [address] Phone: [phone] Fax: [phone]

## 2019-09-01 ENCOUNTER — Ambulatory Visit (INDEPENDENT_AMBULATORY_CARE_PROVIDER_SITE_OTHER): Payer: PRIVATE HEALTH INSURANCE | Admitting: *Deleted

## 2019-09-01 ENCOUNTER — Telehealth: Payer: Self-pay

## 2019-09-01 DIAGNOSIS — I469 Cardiac arrest, cause unspecified: Secondary | ICD-10-CM | POA: Diagnosis not present

## 2019-09-01 LAB — CUP PACEART REMOTE DEVICE CHECK
Battery Remaining Longevity: 47 mo
Battery Remaining Percentage: 55 %
Battery Voltage: 2.95 V
Date Time Interrogation Session: 20201207132852
HighPow Impedance: 63 Ohm
HighPow Impedance: 63 Ohm
Implantable Lead Implant Date: 20170525
Implantable Lead Implant Date: 20170525
Implantable Lead Implant Date: 20170525
Implantable Lead Location: 753858
Implantable Lead Location: 753859
Implantable Lead Location: 753860
Implantable Lead Model: 7122
Implantable Pulse Generator Implant Date: 20170525
Lead Channel Impedance Value: 390 Ohm
Lead Channel Impedance Value: 400 Ohm
Lead Channel Impedance Value: 600 Ohm
Lead Channel Pacing Threshold Amplitude: 0.5 V
Lead Channel Pacing Threshold Amplitude: 1.5 V
Lead Channel Pacing Threshold Pulse Width: 0.5 ms
Lead Channel Pacing Threshold Pulse Width: 0.5 ms
Lead Channel Sensing Intrinsic Amplitude: 0.3 mV
Lead Channel Sensing Intrinsic Amplitude: 11.4 mV
Lead Channel Setting Pacing Amplitude: 2 V
Lead Channel Setting Pacing Amplitude: 2.5 V
Lead Channel Setting Pacing Pulse Width: 0.5 ms
Lead Channel Setting Pacing Pulse Width: 0.5 ms
Lead Channel Setting Sensing Sensitivity: 0.5 mV
Pulse Gen Serial Number: 7357926

## 2019-09-01 NOTE — Telephone Encounter (Signed)
The pt wanted to know if his transmission came through. I told him we did not get it. He states he was going to send it again. I told him I would check to see if I see it in 30 minutes then give him a call back.

## 2019-09-01 NOTE — Telephone Encounter (Signed)
I spoke with the pt wife and let her know we got his transmission.

## 2019-09-02 ENCOUNTER — Ambulatory Visit (INDEPENDENT_AMBULATORY_CARE_PROVIDER_SITE_OTHER): Payer: PRIVATE HEALTH INSURANCE | Admitting: Nurse Practitioner

## 2019-09-02 ENCOUNTER — Other Ambulatory Visit: Payer: Self-pay

## 2019-09-02 ENCOUNTER — Encounter: Payer: Self-pay | Admitting: Nurse Practitioner

## 2019-09-02 VITALS — BP 154/96 | HR 71 | Ht 69.0 in | Wt 224.0 lb

## 2019-09-02 DIAGNOSIS — I4819 Other persistent atrial fibrillation: Secondary | ICD-10-CM | POA: Diagnosis not present

## 2019-09-02 DIAGNOSIS — Z79899 Other long term (current) drug therapy: Secondary | ICD-10-CM

## 2019-09-02 DIAGNOSIS — Z951 Presence of aortocoronary bypass graft: Secondary | ICD-10-CM

## 2019-09-02 DIAGNOSIS — I130 Hypertensive heart and chronic kidney disease with heart failure and stage 1 through stage 4 chronic kidney disease, or unspecified chronic kidney disease: Secondary | ICD-10-CM | POA: Diagnosis not present

## 2019-09-02 DIAGNOSIS — Z8674 Personal history of sudden cardiac arrest: Secondary | ICD-10-CM

## 2019-09-02 DIAGNOSIS — I5022 Chronic systolic (congestive) heart failure: Secondary | ICD-10-CM | POA: Diagnosis not present

## 2019-09-02 DIAGNOSIS — Z95810 Presence of automatic (implantable) cardiac defibrillator: Secondary | ICD-10-CM

## 2019-09-02 DIAGNOSIS — Z7901 Long term (current) use of anticoagulants: Secondary | ICD-10-CM

## 2019-09-02 DIAGNOSIS — E1122 Type 2 diabetes mellitus with diabetic chronic kidney disease: Secondary | ICD-10-CM | POA: Diagnosis not present

## 2019-09-02 DIAGNOSIS — I428 Other cardiomyopathies: Secondary | ICD-10-CM

## 2019-09-02 DIAGNOSIS — N184 Chronic kidney disease, stage 4 (severe): Secondary | ICD-10-CM

## 2019-09-02 DIAGNOSIS — I10 Essential (primary) hypertension: Secondary | ICD-10-CM

## 2019-09-02 DIAGNOSIS — E785 Hyperlipidemia, unspecified: Secondary | ICD-10-CM

## 2019-09-02 DIAGNOSIS — Z952 Presence of prosthetic heart valve: Secondary | ICD-10-CM | POA: Diagnosis not present

## 2019-09-02 DIAGNOSIS — Z8679 Personal history of other diseases of the circulatory system: Secondary | ICD-10-CM

## 2019-09-02 DIAGNOSIS — Z7189 Other specified counseling: Secondary | ICD-10-CM

## 2019-09-02 MED ORDER — BIDIL 20-37.5 MG PO TABS: 1.0000 | ORAL_TABLET | Freq: Two times a day (BID) | ORAL | 3 refills | Status: DC

## 2019-09-02 NOTE — Patient Instructions (Addendum)
After Visit Summary:  We will be checking the following labs today - BMET. CBC, HPF and TSH   Medication Instructions:    Continue with your current medicines. BUT  The Bidil is twice a day - I have sent this to your pharmacy.    If you need a refill on your cardiac medications before your next appointment, please call your pharmacy.     Testing/Procedures To Be Arranged:  Echocardiogram next week as planned - we will call you with those results.   Follow-Up:   Let's try to move your visit up with Dr. Tamala Julian to in about 4 to 6 weeks.    At [facility], you and your health needs are our priority.  As part of our continuing mission to provide you with exceptional heart care, we have created designated Provider Care Teams.  These Care Teams include your primary Cardiologist (physician) and Advanced Practice Providers (APPs -  Physician Assistants and Nurse Practitioners) who all work together to provide you with the care you need, when you need it.  Special Instructions:  . Stay safe, stay home, wash your hands for at least 20 seconds and wear a mask when out in public.  . It was good to talk with you today.  . Try to check your blood pressure for [place] - especially if you feel dizzy.    Call the [place] office at [phone] if you have any questions, problems or concerns.

## 2019-09-03 ENCOUNTER — Other Ambulatory Visit: Payer: Self-pay | Admitting: Internal Medicine

## 2019-09-03 ENCOUNTER — Telehealth: Payer: Self-pay | Admitting: Interventional Cardiology

## 2019-09-03 ENCOUNTER — Other Ambulatory Visit: Payer: Self-pay | Admitting: Nurse Practitioner

## 2019-09-03 DIAGNOSIS — N183 Chronic kidney disease, stage 3 unspecified: Secondary | ICD-10-CM

## 2019-09-03 DIAGNOSIS — R778 Other specified abnormalities of plasma proteins: Secondary | ICD-10-CM

## 2019-09-03 DIAGNOSIS — R779 Abnormality of plasma protein, unspecified: Secondary | ICD-10-CM

## 2019-09-03 LAB — BASIC METABOLIC PANEL
BUN/Creatinine Ratio: 13 (ref 10–24)
BUN: 26 mg/dL (ref 8–27)
CO2: 20 mmol/L (ref 20–29)
Calcium: 8.8 mg/dL (ref 8.6–10.2)
Chloride: 104 mmol/L (ref 96–106)
Creatinine, Ser: 1.99 mg/dL — ABNORMAL HIGH (ref 0.76–1.27)
GFR calc Af Amer: 39 mL/min/{1.73_m2} — ABNORMAL LOW (ref >59)
GFR calc non Af Amer: 34 mL/min/{1.73_m2} — ABNORMAL LOW (ref >59)
Glucose: 107 mg/dL — ABNORMAL HIGH (ref 65–99)
Potassium: 4.1 mmol/L (ref 3.5–5.2)
Sodium: 137 mmol/L (ref 134–144)

## 2019-09-03 LAB — HEPATIC FUNCTION PANEL
ALT: 56 IU/L — ABNORMAL HIGH (ref 0–44)
AST: 69 IU/L — ABNORMAL HIGH (ref 0–40)
Albumin: 3.1 g/dL — ABNORMAL LOW (ref 3.8–4.8)
Alkaline Phosphatase: 82 IU/L (ref 39–117)
Bilirubin Total: 0.9 mg/dL (ref 0.0–1.2)
Bilirubin, Direct: 0.36 mg/dL (ref 0.00–0.40)
Total Protein: 10 g/dL — ABNORMAL HIGH (ref 6.0–8.5)

## 2019-09-03 LAB — CBC
Hematocrit: 30.8 % — ABNORMAL LOW (ref 37.5–51.0)
Hemoglobin: 9.7 g/dL — ABNORMAL LOW (ref 13.0–17.7)
MCH: 26.6 pg (ref 26.6–33.0)
MCHC: 31.5 g/dL (ref 31.5–35.7)
MCV: 84 fL (ref 79–97)
Platelets: 276 10*3/uL (ref 150–450)
RBC: 3.65 x10E6/uL — ABNORMAL LOW (ref 4.14–5.80)
RDW: 17.9 % — ABNORMAL HIGH (ref 11.6–15.4)
WBC: 5.7 10*3/uL (ref 3.4–10.8)

## 2019-09-03 LAB — TSH: TSH: 3.41 u[IU]/mL (ref 0.450–4.500)

## 2019-09-03 NOTE — Progress Notes (Signed)
r 

## 2019-09-03 NOTE — Telephone Encounter (Signed)
New Message  Patient is returning call about results. Patient is wanting a call back today. Please give patient a call back.

## 2019-09-04 ENCOUNTER — Other Ambulatory Visit: Payer: Self-pay | Admitting: *Deleted

## 2019-09-04 DIAGNOSIS — D759 Disease of blood and blood-forming organs, unspecified: Secondary | ICD-10-CM

## 2019-09-04 NOTE — Telephone Encounter (Signed)
Noted.   I did place orders for both urine and serum protein electrophoresis if he wishes to proceed with while waiting for appointments.  Cecille Rubin

## 2019-09-04 NOTE — Telephone Encounter (Signed)
Patient returning call for results 

## 2019-09-04 NOTE — Telephone Encounter (Signed)
He said he preferred to wait and do everything after the beginning of the year with Nephro and Hem.

## 2019-09-04 NOTE — Telephone Encounter (Signed)
Thanks

## 2019-09-04 NOTE — Telephone Encounter (Signed)
lvm for pt to call back addressing pt's phone call from yesterday.

## 2019-09-04 NOTE — Telephone Encounter (Signed)
Spoke with pt and he states he recalled his protein levels being elevated previously and PCP sent him to Nephro.  He spoke with Dr. Baird Cancer yesterday to review our information.  He states that she put in referral to Nephro and Hematology.  He would like to wait until the beginning of the year to do the work up on all of this.  He said Dr. Baird Cancer said she would take care of getting Nephro and Hema arranged.  Advised I will send to Truitt Merle, NP and Dr. Tamala Julian to update them.

## 2019-09-09 ENCOUNTER — Other Ambulatory Visit (HOSPITAL_COMMUNITY): Payer: Medicare Other

## 2019-09-16 ENCOUNTER — Ambulatory Visit: Payer: Medicare Other | Admitting: Internal Medicine

## 2019-09-28 NOTE — Progress Notes (Signed)
ICD remote 

## 2019-09-29 ENCOUNTER — Other Ambulatory Visit: Payer: Self-pay | Admitting: Internal Medicine

## 2019-10-07 LAB — CUP PACEART INCLINIC DEVICE CHECK
Date Time Interrogation Session: 20200908101147
Implantable Lead Implant Date: 20170525
Implantable Lead Implant Date: 20170525
Implantable Lead Implant Date: 20170525
Implantable Lead Location: 753858
Implantable Lead Location: 753859
Implantable Lead Location: 753860
Implantable Lead Model: 7122
Implantable Pulse Generator Implant Date: 20170525
Pulse Gen Serial Number: 7357926

## 2019-10-20 NOTE — Progress Notes (Signed)
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 Cardiac Valve Problem  . Coronary Artery Disease  . 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 with a hx of severe aortic regurgitation, valve induced left ventricular systolic dysfunction/nonischemic cardiomyopathy, CKD stage IV, essential hypertension, type 2 diabetes, prior history of cardiac arrest due to ventricular fibrillation, CAD without angina with known significant diagonal stenosis, and November 20, 2018 underwent aortic valve replacementand aortic root replacementusing bioprosthetic valve/aortic graftandsingle-vessel coronary graftingwith saphenous vein.Subsequent sternal wound infection requiring sternal flap and prolonged antibiotics.Noted end of 2020 to have monoclonal spike and concern for myeloma.  He is back practicing.  He has not in the operating room.  He is seeing people in his office.  He is under a lot of stress.  His remaining uncle died of COVID-24 recently.  He has not had diuretic therapy yet today.  Blood pressure is elevated today.  He has not had blood in his urine or stool.  He denies angina, orthopnea, lower extremity edema, syncope, and significant palpitations.  Past Medical History:  Diagnosis Date  . Acute blood loss anemia   . Acute encephalopathy   . Acute hypoxemic respiratory failure ([place])   . Acute idiopathic gout of right ankle   . Acute lumbar back pain   . Acute on chronic systolic heart failure ([place])   . Acute respiratory failure ([place])   . AKI (acute kidney injury) ([place])   . Altered mental status   . Aortic aneurysm without rupture ([place]) 02/09/2016  . Aortic root enlargement ([place]) 02/13/2012  . Aortic valve regurgitation, acquired  02/13/2012  . Arrhythmia   . Atrial flutter ([place]) 03/18/2012  . Back pain   . Cardiac arrest ([place]) 02/01/2016  . CHF (congestive heart failure) ([place])   . Chronic anticoagulation 03/04/2013  . Chronic kidney disease    kidney fx studies increased   . Chronic lower back pain   . Chronic renal insufficiency   . Chronic systolic heart failure ([place]) 02/13/2012   Recent diagnosis 4 / 2013, LVEF 25% by Echo 12/2011  03/2013: Echo at [facility] Conclusions: 1. Left ventricular ejection fraction estimated by 2D at 40-45 percent. 2. Mild concentric left ventricular hypertrophy. 3. Mild left atrial enlargement. 4. Moderate aortic valve regurgitation. 5. The aortic root at the sinus(es) of valsalva is moderately dilated 6. Mild mitral valve regurgitation. 7.  . CKD (chronic kidney disease)   . CKD (chronic kidney disease), stage IV ([place]) 08/06/2013   Creatinine 2.4 on 07/04/13   . Claustrophobia   . Colon cancer screening 03/04/2013  . Debility 02/22/2016  . Diabetes mellitus type 2 in nonobese (HCC)   . Diabetes mellitus type 2 in obese ([place])   . Dysrhythmia    "palpitations"  . Encounter for central line placement   . Exertional dyspnea 01/2012  . 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 HCAP (healthcare-associated pneumonia)   . Heart murmur   . Hyperlipidemia 02/13/2012  . Hypertension   . Hypothyroidism   . Internal hemorrhoids without mention of complication 06/15/1940  . Labile blood pressure   . Left bundle branch block 02/13/2012  .  Leg weakness, bilateral   . Long term (current) use of anticoagulants 08/06/2013   Eliquis therapy   . Long term current use of amiodarone 08/07/2016  . Lower extremity weakness   . Migraine 02/13/12   "opthalmic"  . Non-traumatic rhabdomyolysis   . Obesity (BMI 30-39.9) 02/13/2012  . Pain   . Paroxysmal atrial fibrillation (HCC)   . Pneumonia   . Retroperitoneal bleed   . Right ankle pain   . Right knee pain   . Severe aortic  regurgitation 02/09/2016  . Special screening for malignant neoplasms, colon 04/11/2013  . Thyroid activity decreased   . Tibial pain   . Varicose vein of leg    right  . Ventricular fibrillation ([place]) 02/09/2016  . Weakness of both lower extremities     Past Surgical History:  Procedure Laterality Date  . AORTIC VALVE REPLACEMENT  11/20/2018  . CARDIAC CATHETERIZATION N/A 02/17/2016   Procedure: Left Heart Cath and Coronary Angiography;  Surgeon: Jettie Booze, MD;  Location: [place] [facility];  Service: Cardiovascular;  Laterality: N/A;  . CARDIOVERSION  03/22/2012   Procedure: CARDIOVERSION;  Surgeon: Candee Furbish, MD;  Location: [facility] ENDOSCOPY;  Service: Cardiovascular;  Laterality: N/A;  . CARDIOVERSION  04/19/2012   Procedure: CARDIOVERSION;  Surgeon: Sinclair Grooms, MD;  Location: [place];  Service: Cardiovascular;  Laterality: N/A;  . COLONOSCOPY N/A 04/11/2013   Procedure: COLONOSCOPY;  Surgeon: Inda Castle, MD;  Location: [facility] ENDOSCOPY;  Service: Endoscopy;  Laterality: N/A;  . COLONOSCOPY N/A 04/11/2013   Procedure: COLONOSCOPY;  Surgeon: Inda Castle, MD;  Location: [facility] ENDOSCOPY;  Service: Endoscopy;  Laterality: N/A;  . EP IMPLANTABLE DEVICE N/A 02/17/2016   Procedure: BiV ICD Insertion CRT-D;  Surgeon: Evans Lance, MD;  Location: [place] [facility];  Service: Cardiovascular;  Laterality: N/A;  . FINGER SURGERY  2012   "4th digit right hand; thumb on left hand"  . RADIOLOGY WITH ANESTHESIA N/A 02/11/2016   Procedure: MRI OF THE BRAIN WITHOUT CONTRAST, LUMBAR WITHOUT CONTRAST;  Surgeon: Medication Radiologist, MD;  Location: [place];  Service: Radiology;  Laterality: N/A;  DR. WOOD/MRI  . RIGHT/LEFT HEART CATH AND CORONARY ANGIOGRAPHY N/A 07/26/2018   Procedure: RIGHT/LEFT HEART CATH AND CORONARY ANGIOGRAPHY;  Surgeon: Belva Crome, MD;  Location: [place] [facility];  Service: Cardiovascular;  Laterality: N/A;  . Skin melanocytoma excision  2012   "above left clavicle"    . STERNAL INCISION RECLOSURE  11/2018  . STERNAL WIRE REMOVAL  11/2018  . STERNAL WOUND DEBRIDEMENT  11/2018  . TEE WITHOUT CARDIOVERSION  03/22/2012   Procedure: TRANSESOPHAGEAL ECHOCARDIOGRAM (TEE);  Surgeon: Candee Furbish, MD;  Location: [facility] ENDOSCOPY;  Service: Cardiovascular;  Laterality: N/A;  . TEE WITHOUT CARDIOVERSION N/A 02/08/2016   Procedure: TRANSESOPHAGEAL ECHOCARDIOGRAM (TEE);  Surgeon: Lelon Perla, MD;  Location: [facility] ENDOSCOPY;  Service: Cardiovascular;  Laterality: N/A;    Current Medications: Current Meds  Medication Sig  . acetaminophen (TYLENOL) 500 MG tablet Take 1,500 mg by mouth every 4 (four) hours as needed for moderate pain or headache.   Marland Kitchen amiodarone (PACERONE) 100 MG tablet Take 1 tablet (100 mg total) by mouth daily.  Marland Kitchen amiodarone (PACERONE) 200 MG tablet Take 100 mg by mouth daily.  Marland Kitchen apixaban (ELIQUIS) 5 MG TABS tablet Take 1 tablet (5 mg total) by mouth 2 (two) times daily.  . carvedilol (COREG) 6.25 MG tablet Take 1 tablet (6.25 mg total) by mouth 2 (two) times daily.  Marland Kitchen  docusate sodium (COLACE) 100 MG capsule Take 100 mg by mouth daily as needed for mild constipation.  . fluticasone (FLONASE) 50 MCG/ACT nasal spray Place 2 sprays into the nose as needed for allergies.   . furosemide (LASIX) 40 MG tablet Take 1.5 tablets (60 mg total) by mouth daily.  . Hypromellose (ARTIFICIAL TEARS OP) Place 1 drop into both eyes daily as needed (dry eyes).  Marland Kitchen ipratropium (ATROVENT) 0.06 % nasal spray Place 2 sprays into both nostrils 2 (two) times daily.  . isosorbide-hydrALAZINE (BIDIL) 20-37.5 MG tablet Take 1 tablet by mouth 3 (three) times daily.  Marland Kitchen levothyroxine (SYNTHROID) 75 MCG tablet TAKE 1 TABLET BY MOUTH DAILY IN THE MORNING EXCEPT TAKE 1/2 TABLET ON SUNDAYS IN THE MORNING  . potassium chloride 20 MEQ/15ML (10%) SOLN Take 15 mLs (20 mEq total) by mouth daily.  . rosuvastatin (CRESTOR) 20 MG tablet TAKE 1 TABLET BY MOUTH DAILY  . vitamin B-12 (CYANOCOBALAMIN)  100 MCG tablet Take 100 mcg by mouth daily.   . vitamin C (ASCORBIC ACID) 500 MG tablet Take 500 mg by mouth as needed.   . [DISCONTINUED] isosorbide-hydrALAZINE (BIDIL) 20-37.5 MG tablet Take 1 tablet by mouth 2 (two) times daily.     Allergies:   Lactose intolerance (gi)   Social History   Socioeconomic History  . Marital status: Married    Spouse name: [name]  . Number of children: 1  . Years of education: Not on file  . Highest education level: Not on file  Occupational History    Employer: [employer]  Tobacco Use  . Smoking status: Never Smoker  . Smokeless tobacco: Never Used  Substance and Sexual Activity  . Alcohol use: Yes    Comment: rarely  . Drug use: No  . Sexual activity: Not Currently  Other Topics Concern  . 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   Social Determinants of Health   Financial Resource Strain: Low Risk   . Difficulty of Paying Living Expenses: Not hard at all  Food Insecurity: No Food Insecurity  . Worried About Charity fundraiser in the Last Year: Never true  . Ran Out of Food in the Last Year: Never true  Transportation Needs: No Transportation Needs  . Lack of Transportation (Medical): No  . Lack of Transportation (Non-Medical): No  Physical Activity: Inactive  . Days of Exercise per Week: 0 days  . Minutes of Exercise per Session: 0 min  Stress: Stress Concern Present  . Feeling of Stress : To some extent  Social Connections:   . Frequency of Communication with Friends and Family: Not on file  . Frequency of Social Gatherings with Friends and Family: Not on file  . Attends Religious Services: Not on file  . Active Member of Clubs or Organizations: Not on file  . Attends Archivist Meetings: Not on file  . Marital Status: Not on file     Family History: The patient's family history includes Diabetes in his mother; Heart disease in  his father and mother; Heart failure in his father and mother; Hypertension in his father and mother.  ROS:   Please see the history of present illness.    His family is doing well.  He is not taking furosemide when he has to be in the office because of frequent urination.  He does have elevated total protein and will  be seeing a hematologist to be evaluated.  Does not have time to do his echocardiogram today.  All other systems reviewed and are negative.  EKGs/Labs/Other Studies Reviewed:    The following studies were reviewed today: No new imaging data  EKG:  EKG not repeated  Recent Labs: 12/05/2018: NT-Pro BNP 4,691 09/02/2019: ALT 56; BUN 26; Creatinine, Ser 1.99; Hemoglobin 9.7; Platelets 276; Potassium 4.1; Sodium 137; TSH 3.410  Recent Lipid Panel    Component Value Date/Time   CHOL 121 05/13/2019 1549   TRIG 48 05/13/2019 1549   HDL 49 05/13/2019 1549   CHOLHDL 2.5 05/13/2019 1549   CHOLHDL 3.8 02/13/2012 1829   VLDL 30 02/13/2012 1829   LDLCALC 62 05/13/2019 1549    Physical Exam:    VS:  BP (!) 160/102   Pulse 70   Ht 5\' 9"  (1.753 m)   Wt 223 lb 12.8 oz (101.5 kg)   SpO2 98%   BMI 33.05 kg/m     Wt Readings from Last 3 Encounters:  10/21/19 223 lb 12.8 oz (101.5 kg)  09/02/19 224 lb (101.6 kg)  08/11/19 224 lb (101.6 kg)     GEN: Appears healthy.. No acute distress HEENT: Normal NECK: No JVD. LYMPHATICS: No lymphadenopathy CARDIAC: 1/6 to 2/6 systolic right upper sternal border murmur.  No diastolic murmur.  Irregularly irregular rate and rhythm without murmur, gallop, or edema. VASCULAR:  Normal Pulses. No bruits. RESPIRATORY:  Clear to auscultation without rales, wheezing or rhonchi  ABDOMEN: Soft, non-tender, non-distended, No pulsatile mass, MUSCULOSKELETAL: No deformity  SKIN: Warm and dry NEUROLOGIC:  Alert and oriented x 3 PSYCHIATRIC:  Normal affect   ASSESSMENT:    1. S/P AVR (aortic valve replacement)   2. Persistent atrial fibrillation  ([place])   3. High risk medication use   4. Chronic systolic heart failure ([place])   5. Essential hypertension   6. S/P ascending aortic aneurysm repair   7. Long term current use of anticoagulant   8. Educated about COVID-19 virus infection    PLAN:    In order of problems listed above:  1. Status post aortic valve replacement, tissue.  No clinical or auscultatory evidence of aortic regurgitation. 2. Atrial fibrillation is continual. 3. Amiodarone therapy is being used.  If the next EKG demonstrates atrial fibrillation will consider discontinuing therapy.  Liver and TSH need to be performed. 4. No evidence of volume overload 5. Blood pressure is extremely elevated today but he has not had his diuretic.  Increase BiDil to 3 times per day.  Measure blood pressure daily over the next 7 days and report results.  Blood pressure should be recorded at least 2 hours after a.m. diuretic has been taken. 6. Not addressed other than importance of's blood pressure control. 7. No significant bleeding issues on apixaban 5 mg twice daily. 8. 3W's and COVID-19 vaccine endorsed by the patient.  45-month follow-up Echo was scheduled to be performed today.  We will defer until later in the spring as the patient is unable to keep his appointment today.   Medication Adjustments/Labs and Tests Ordered: Current medicines are reviewed at length with the patient today.  Concerns regarding medicines are outlined above.  No orders of the defined types were placed in this encounter.  Meds ordered this encounter  Medications  . isosorbide-hydrALAZINE (BIDIL) 20-37.5 MG tablet    Sig: Take 1 tablet by mouth 3 (three) times daily.    Dispense:  270 tablet    Refill:  3    Dose change    Patient Instructions  Medication Instructions:  1) INCREASE Bidil to three times daily.  *If you need a refill on your cardiac medications before your next appointment, please call your pharmacy*  Lab Work: None If you have  labs (blood work) drawn today and your tests are completely normal, you will receive your results only by: Marland Kitchen MyChart Message (if you have MyChart) OR . A paper copy in the mail If you have any lab test that is abnormal or we need to change your treatment, we will call you to review the results.  Testing/Procedures: Your physician has requested that you have an echocardiogram. Echocardiography is a painless test that uses sound waves to create images of your heart. It provides your doctor with information about the size and shape of your heart and how well your heart's chambers and valves are working. This procedure takes approximately one hour. There are no restrictions for this procedure.   Follow-Up: At [facility], you and your health needs are our priority.  As part of our continuing mission to provide you with exceptional heart care, we have created designated Provider Care Teams.  These Care Teams include your primary Cardiologist (physician) and Advanced Practice Providers (APPs -  Physician Assistants and Nurse Practitioners) who all work together to provide you with the care you need, when you need it.  Your next appointment:   6 month(s)  The format for your next appointment:   In Person  Provider:   You may see Sinclair Grooms, MD  or one of the following Advanced Practice Providers on your designated Care Team:    Truitt Merle, NP  Cecilie Kicks, NP  Kathyrn Drown, NP   Other Instructions      Signed, Sinclair Grooms, MD  10/21/2019 4:59 PM    [place]

## 2019-10-21 ENCOUNTER — Ambulatory Visit (HOSPITAL_COMMUNITY): Payer: PRIVATE HEALTH INSURANCE | Attending: Interventional Cardiology

## 2019-10-21 ENCOUNTER — Ambulatory Visit: Payer: PRIVATE HEALTH INSURANCE | Admitting: Interventional Cardiology

## 2019-10-21 ENCOUNTER — Encounter: Payer: Self-pay | Admitting: Interventional Cardiology

## 2019-10-21 ENCOUNTER — Other Ambulatory Visit: Payer: Self-pay

## 2019-10-21 ENCOUNTER — Ambulatory Visit (INDEPENDENT_AMBULATORY_CARE_PROVIDER_SITE_OTHER): Payer: PRIVATE HEALTH INSURANCE | Admitting: Interventional Cardiology

## 2019-10-21 VITALS — BP 160/102 | HR 70 | Ht 69.0 in | Wt 223.8 lb

## 2019-10-21 DIAGNOSIS — I4819 Other persistent atrial fibrillation: Secondary | ICD-10-CM

## 2019-10-21 DIAGNOSIS — Z8679 Personal history of other diseases of the circulatory system: Secondary | ICD-10-CM

## 2019-10-21 DIAGNOSIS — I10 Essential (primary) hypertension: Secondary | ICD-10-CM

## 2019-10-21 DIAGNOSIS — I5022 Chronic systolic (congestive) heart failure: Secondary | ICD-10-CM

## 2019-10-21 DIAGNOSIS — Z952 Presence of prosthetic heart valve: Secondary | ICD-10-CM

## 2019-10-21 DIAGNOSIS — Z9889 Other specified postprocedural states: Secondary | ICD-10-CM

## 2019-10-21 DIAGNOSIS — Z7901 Long term (current) use of anticoagulants: Secondary | ICD-10-CM

## 2019-10-21 DIAGNOSIS — Z7189 Other specified counseling: Secondary | ICD-10-CM

## 2019-10-21 DIAGNOSIS — Z79899 Other long term (current) drug therapy: Secondary | ICD-10-CM

## 2019-10-21 MED ORDER — BIDIL 20-37.5 MG PO TABS: 1.0000 | ORAL_TABLET | Freq: Three times a day (TID) | ORAL | 3 refills | Status: DC

## 2019-10-21 NOTE — Patient Instructions (Addendum)
Medication Instructions:  1) INCREASE Bidil to three times daily.  *If you need a refill on your cardiac medications before your next appointment, please call your pharmacy*  Lab Work: None If you have labs (blood work) drawn today and your tests are completely normal, you will receive your results only by: Marland Kitchen MyChart Message (if you have MyChart) OR . A paper copy in the mail If you have any lab test that is abnormal or we need to change your treatment, we will call you to review the results.  Testing/Procedures: Your physician has requested that you have an echocardiogram. Echocardiography is a painless test that uses sound waves to create images of your heart. It provides your doctor with information about the size and shape of your heart and how well your heart's chambers and valves are working. This procedure takes approximately one hour. There are no restrictions for this procedure.   Follow-Up: At [facility], you and your health needs are our priority.  As part of our continuing mission to provide you with exceptional heart care, we have created designated Provider Care Teams.  These Care Teams include your primary Cardiologist (physician) and Advanced Practice Providers (APPs -  Physician Assistants and Nurse Practitioners) who all work together to provide you with the care you need, when you need it.  Your next appointment:   6 month(s)  The format for your next appointment:   In Person  Provider:   You may see Sinclair Grooms, MD  or one of the following Advanced Practice Providers on your designated Care Team:    Truitt Merle, NP  Cecilie Kicks, NP  Kathyrn Drown, NP   Other Instructions

## 2019-10-28 ENCOUNTER — Encounter (HOSPITAL_COMMUNITY): Payer: Self-pay | Admitting: Radiology

## 2019-11-11 ENCOUNTER — Other Ambulatory Visit: Payer: Self-pay

## 2019-11-11 ENCOUNTER — Ambulatory Visit (INDEPENDENT_AMBULATORY_CARE_PROVIDER_SITE_OTHER): Payer: Self-pay | Admitting: Podiatry

## 2019-11-11 ENCOUNTER — Encounter: Payer: Self-pay | Admitting: Podiatry

## 2019-11-11 DIAGNOSIS — B351 Tinea unguium: Secondary | ICD-10-CM

## 2019-11-11 DIAGNOSIS — D689 Coagulation defect, unspecified: Secondary | ICD-10-CM

## 2019-11-11 DIAGNOSIS — M79674 Pain in right toe(s): Secondary | ICD-10-CM

## 2019-11-11 DIAGNOSIS — L608 Other nail disorders: Secondary | ICD-10-CM

## 2019-11-11 DIAGNOSIS — M79675 Pain in left toe(s): Secondary | ICD-10-CM

## 2019-11-11 NOTE — Progress Notes (Signed)
Complaint:  Visit Type: Patient returns to my office for continued preventative foot care services. Complaint: Patient states" my nails have grown long and thick and become painful to walk and wear shoes" Patient has been diagnosed with DM with no foot complications. The patient presents for preventative foot care services. No changes to ROS.  Patient is taking eliquiss.  Podiatric Exam: Vascular: dorsalis pedis and posterior tibial pulses are palpable bilateral. Capillary return is immediate. Temperature gradient is WNL. Skin turgor WNL  Sensorium: Normal Semmes Weinstein monofilament test. Normal tactile sensation bilaterally. Nail Exam: Pt has thick disfigured discolored nails with subungual debris noted bilateral entire nail hallux through fifth toenails.  Pincer nails noted. Ulcer Exam: There is no evidence of ulcer or pre-ulcerative changes or infection. Orthopedic Exam: Muscle tone and strength are WNL. No limitations in general ROM. No crepitus or effusions noted. Foot type and digits show no abnormalities. Bony prominences are unremarkable. Skin: No Porokeratosis. No infection or ulcers  Diagnosis:  Onychomycosis, , Pain in right toe, pain in left toes Pincer nails    Treatment & Plan Procedures and Treatment: Consent by patient was obtained for treatment procedures.   Debridement of mycotic and hypertrophic toenails, 1 through 5 bilateral and clearing of subungual debris. No ulceration, no infection noted.  Return Visit-Office Procedure: Patient instructed to return to the office for a follow up visit 3 months for continued evaluation and treatment.    Isabella Roemmich DPM 

## 2019-11-13 ENCOUNTER — Inpatient Hospital Stay: Payer: Medicare Other

## 2019-11-13 ENCOUNTER — Telehealth: Payer: Self-pay | Admitting: Hematology & Oncology

## 2019-11-13 ENCOUNTER — Ambulatory Visit: Payer: PRIVATE HEALTH INSURANCE | Admitting: Hematology & Oncology

## 2019-11-18 ENCOUNTER — Ambulatory Visit: Payer: Medicare Other | Admitting: Interventional Cardiology

## 2019-12-01 ENCOUNTER — Other Ambulatory Visit: Payer: PRIVATE HEALTH INSURANCE

## 2019-12-01 ENCOUNTER — Ambulatory Visit: Payer: PRIVATE HEALTH INSURANCE | Admitting: Hematology & Oncology

## 2019-12-01 ENCOUNTER — Ambulatory Visit (INDEPENDENT_AMBULATORY_CARE_PROVIDER_SITE_OTHER): Payer: PRIVATE HEALTH INSURANCE | Admitting: *Deleted

## 2019-12-01 DIAGNOSIS — I469 Cardiac arrest, cause unspecified: Secondary | ICD-10-CM | POA: Diagnosis not present

## 2019-12-03 LAB — CUP PACEART REMOTE DEVICE CHECK
Battery Remaining Longevity: 44 mo
Battery Remaining Percentage: 52 %
Battery Voltage: 2.93 V
Date Time Interrogation Session: 20210309183031
HighPow Impedance: 63 Ohm
HighPow Impedance: 63 Ohm
Implantable Lead Implant Date: 20170525
Implantable Lead Implant Date: 20170525
Implantable Lead Implant Date: 20170525
Implantable Lead Location: 753858
Implantable Lead Location: 753859
Implantable Lead Location: 753860
Implantable Lead Model: 7122
Implantable Pulse Generator Implant Date: 20170525
Lead Channel Impedance Value: 400 Ohm
Lead Channel Impedance Value: 430 Ohm
Lead Channel Impedance Value: 640 Ohm
Lead Channel Pacing Threshold Amplitude: 0.5 V
Lead Channel Pacing Threshold Amplitude: 1.5 V
Lead Channel Pacing Threshold Pulse Width: 0.5 ms
Lead Channel Pacing Threshold Pulse Width: 0.5 ms
Lead Channel Sensing Intrinsic Amplitude: 0.2 mV
Lead Channel Sensing Intrinsic Amplitude: 11.4 mV
Lead Channel Setting Pacing Amplitude: 2 V
Lead Channel Setting Pacing Amplitude: 2.5 V
Lead Channel Setting Pacing Pulse Width: 0.5 ms
Lead Channel Setting Pacing Pulse Width: 0.5 ms
Lead Channel Setting Sensing Sensitivity: 0.5 mV
Pulse Gen Serial Number: 7357926

## 2019-12-03 NOTE — Progress Notes (Signed)
ICD Remote  

## 2019-12-08 ENCOUNTER — Other Ambulatory Visit: Payer: Self-pay | Admitting: Interventional Cardiology

## 2019-12-08 MED ORDER — ROSUVASTATIN CALCIUM 20 MG PO TABS: 20.0000 mg | ORAL_TABLET | Freq: Every day | ORAL | 2 refills | Status: DC

## 2020-01-08 ENCOUNTER — Other Ambulatory Visit: Payer: Self-pay | Admitting: Internal Medicine

## 2020-01-23 ENCOUNTER — Other Ambulatory Visit: Payer: Self-pay | Admitting: Internal Medicine

## 2020-02-17 ENCOUNTER — Encounter: Payer: Self-pay | Admitting: Podiatry

## 2020-02-17 ENCOUNTER — Other Ambulatory Visit: Payer: Self-pay

## 2020-02-17 ENCOUNTER — Ambulatory Visit (INDEPENDENT_AMBULATORY_CARE_PROVIDER_SITE_OTHER): Payer: PRIVATE HEALTH INSURANCE | Admitting: Podiatry

## 2020-02-17 VITALS — Temp 97.0°F

## 2020-02-17 DIAGNOSIS — E119 Type 2 diabetes mellitus without complications: Secondary | ICD-10-CM

## 2020-02-17 DIAGNOSIS — L608 Other nail disorders: Secondary | ICD-10-CM

## 2020-02-17 DIAGNOSIS — D689 Coagulation defect, unspecified: Secondary | ICD-10-CM

## 2020-02-17 DIAGNOSIS — M79674 Pain in right toe(s): Secondary | ICD-10-CM

## 2020-02-17 DIAGNOSIS — B351 Tinea unguium: Secondary | ICD-10-CM | POA: Diagnosis not present

## 2020-02-17 DIAGNOSIS — M79675 Pain in left toe(s): Secondary | ICD-10-CM

## 2020-02-17 NOTE — Progress Notes (Signed)
This patient returns to my office for at risk foot care.  This patient requires this care by a professional since this patient will be at risk due to having diabetes type 2, chronic kidney disease and coagulation defect. This patient is unable to cut nails himself since the patient cannot reach his nails.These nails are painful walking and wearing shoes.  This patient presents for at risk foot care today.  General Appearance  Alert, conversant and in no acute stress.  Vascular  Dorsalis pedis and posterior tibial  pulses are palpable  bilaterally.  Capillary return is within normal limits  bilaterally. Temperature is within normal limits  bilaterally.  Neurologic  Senn-Weinstein monofilament wire test within normal limits  bilaterally. Muscle power within normal limits bilaterally.  Nails Thick disfigured discolored nails with subungual debris  from hallux to fifth toes bilaterally. No evidence of bacterial infection or drainage bilaterally.  Orthopedic  No limitations of motion  feet .  No crepitus or effusions noted.  No bony pathology or digital deformities noted.  Skin  normotropic skin with no porokeratosis noted bilaterally.  No signs of infections or ulcers noted.     Onychomycosis  Pain in right toes  Pain in left toes  Consent was obtained for treatment procedures.   Mechanical debridement of nails 1-5  bilaterally performed with a nail nipper.  Filed with dremel without incident.    Return office visit   10 weeks                   Told patient to return for periodic foot care and evaluation due to potential at risk complications.   Gardiner Barefoot DPM

## 2020-02-27 ENCOUNTER — Telehealth: Payer: Self-pay

## 2020-02-27 NOTE — Telephone Encounter (Signed)
   [facility] Pre-operative Risk Assessment    HEARTCARE STAFF: - Please ensure there is not already an duplicate clearance open for this procedure. - Under Visit Info/Reason for Call, type in Other and utilize the format Clearance MM/DD/YY or Clearance TBD. Do not use dashes or single digits. - If request is for dental extraction, please clarify the # of teeth to be extracted.  Request for surgical clearance:  1. What type of surgery is being performed? EXCISIONAL BIOPSY    2. When is this surgery scheduled? TBD   3. What type of clearance is required (medical clearance vs. Pharmacy clearance to hold med vs. Both)? BOTH  4. Are there any medications that need to be held prior to surgery and how long? ELIQUIS PLEASE ADVISE   5. Practice name and name of physician performing surgery? [facility]   6. What is the office phone number? [phone]   7.   What is the office fax number? [phone]  8.   Anesthesia type (None, local, MAC, general) ? LOCAL   Jacinta Shoe 02/27/2020, 5:02 PM  _________________________________________________________________   (provider comments below)

## 2020-02-27 NOTE — Telephone Encounter (Signed)
Clinical pharmacist to review Eliquis 

## 2020-03-01 NOTE — Telephone Encounter (Signed)
Patient with diagnosis of afib on Eliquis for anticoagulation.    Procedure: EXCISIONAL BIOPSY   Date of procedure: TBD  CHADS2-VASc score of  5 (CHF, HTN, AGE, DM2, CAD)  CrCl 41.7 ml/ml  Per office protocol, patient can hold Eliquis for 1 day prior to procedure.

## 2020-03-02 ENCOUNTER — Telehealth: Payer: Self-pay

## 2020-03-02 NOTE — Telephone Encounter (Signed)
Left message for patient to remind of missed remote transmission.  

## 2020-03-03 NOTE — Telephone Encounter (Signed)
   Primary Cardiologist: Sinclair Grooms, MD  Chart reviewed as part of pre-operative protocol coverage. Patient last seen by Dr. Tamala Julian in 09/2019. Patient contacted today for pre-op risk assessment. He has done well from a cardiac standpoint since his last visit. He notes some mild shortness of breath if he tries to go up more than a couple of flights of steps but states this is stable. He also notes some feelings of heart racing with activity but states it does not last long. No chest pain, lightheadedness, dizziness, syncope, orthopnea, or PND. He has some mild lower extremity edema if he has been on his feet all day but states it resolves by the morning. Able to complete >4.0 METS without any anginal symptoms. Given past medical history and time since last visit, based on ACC/AHA guidelines, Eric [name]., MD would be at acceptable risk for the planned procedure without further cardiovascular testing.   Per pharmacy and office protocol, OK to hold Eliquis for 1 day prior to procedure. This should be restarted as soon as possible afterwards.   I will route this recommendation to the requesting party via Epic fax function and remove from pre-op pool.  Please call with questions.  Darreld Mclean, PA-C 03/03/2020, 12:34 PM

## 2020-03-08 ENCOUNTER — Other Ambulatory Visit: Payer: Self-pay | Admitting: *Deleted

## 2020-03-08 MED ORDER — AMIODARONE HCL 200 MG PO TABS: 100.0000 mg | ORAL_TABLET | Freq: Every day | ORAL | 1 refills | Status: DC

## 2020-03-08 MED ORDER — FUROSEMIDE 40 MG PO TABS: 60.0000 mg | ORAL_TABLET | Freq: Every day | ORAL | 1 refills | Status: DC

## 2020-03-09 ENCOUNTER — Ambulatory Visit (INDEPENDENT_AMBULATORY_CARE_PROVIDER_SITE_OTHER): Payer: PRIVATE HEALTH INSURANCE | Admitting: *Deleted

## 2020-03-09 DIAGNOSIS — I5022 Chronic systolic (congestive) heart failure: Secondary | ICD-10-CM

## 2020-03-09 DIAGNOSIS — I428 Other cardiomyopathies: Secondary | ICD-10-CM

## 2020-03-10 LAB — CUP PACEART REMOTE DEVICE CHECK
Battery Remaining Longevity: 41 mo
Battery Remaining Percentage: 48 %
Battery Voltage: 2.93 V
Date Time Interrogation Session: 20210614233223
HighPow Impedance: 61 Ohm
HighPow Impedance: 61 Ohm
Implantable Lead Implant Date: 20170525
Implantable Lead Implant Date: 20170525
Implantable Lead Implant Date: 20170525
Implantable Lead Location: 753858
Implantable Lead Location: 753859
Implantable Lead Location: 753860
Implantable Lead Model: 7122
Implantable Pulse Generator Implant Date: 20170525
Lead Channel Impedance Value: 400 Ohm
Lead Channel Impedance Value: 400 Ohm
Lead Channel Impedance Value: 600 Ohm
Lead Channel Pacing Threshold Amplitude: 0.5 V
Lead Channel Pacing Threshold Amplitude: 1.5 V
Lead Channel Pacing Threshold Pulse Width: 0.5 ms
Lead Channel Pacing Threshold Pulse Width: 0.5 ms
Lead Channel Sensing Intrinsic Amplitude: 0.2 mV
Lead Channel Sensing Intrinsic Amplitude: 11.4 mV
Lead Channel Setting Pacing Amplitude: 2 V
Lead Channel Setting Pacing Amplitude: 2.5 V
Lead Channel Setting Pacing Pulse Width: 0.5 ms
Lead Channel Setting Pacing Pulse Width: 0.5 ms
Lead Channel Setting Sensing Sensitivity: 0.5 mV
Pulse Gen Serial Number: 7357926

## 2020-03-10 NOTE — Progress Notes (Signed)
Remote ICD transmission.   

## 2020-03-30 ENCOUNTER — Ambulatory Visit: Payer: Medicare Other | Admitting: Internal Medicine

## 2020-04-04 ENCOUNTER — Other Ambulatory Visit: Payer: Self-pay | Admitting: Internal Medicine

## 2020-04-06 ENCOUNTER — Other Ambulatory Visit: Payer: Self-pay | Admitting: Internal Medicine

## 2020-04-13 ENCOUNTER — Ambulatory Visit: Payer: Medicare Other | Admitting: Internal Medicine

## 2020-04-13 ENCOUNTER — Ambulatory Visit: Payer: Medicare Other

## 2020-04-14 ENCOUNTER — Ambulatory Visit: Payer: Medicare Other

## 2020-04-14 ENCOUNTER — Ambulatory Visit: Payer: Medicare Other | Admitting: Internal Medicine

## 2020-04-22 ENCOUNTER — Other Ambulatory Visit: Payer: Self-pay

## 2020-04-22 ENCOUNTER — Ambulatory Visit (INDEPENDENT_AMBULATORY_CARE_PROVIDER_SITE_OTHER): Payer: Medicare Other

## 2020-04-22 ENCOUNTER — Ambulatory Visit (INDEPENDENT_AMBULATORY_CARE_PROVIDER_SITE_OTHER): Payer: Medicare Other | Admitting: Internal Medicine

## 2020-04-22 ENCOUNTER — Encounter: Payer: Self-pay | Admitting: Internal Medicine

## 2020-04-22 VITALS — BP 116/74 | HR 70 | Temp 98.1°F | Ht 69.0 in | Wt 227.8 lb

## 2020-04-22 DIAGNOSIS — N183 Chronic kidney disease, stage 3 unspecified: Secondary | ICD-10-CM | POA: Diagnosis not present

## 2020-04-22 DIAGNOSIS — E6609 Other obesity due to excess calories: Secondary | ICD-10-CM

## 2020-04-22 DIAGNOSIS — E119 Type 2 diabetes mellitus without complications: Secondary | ICD-10-CM

## 2020-04-22 DIAGNOSIS — E66811 Obesity, class 1: Secondary | ICD-10-CM

## 2020-04-22 DIAGNOSIS — E1122 Type 2 diabetes mellitus with diabetic chronic kidney disease: Secondary | ICD-10-CM | POA: Diagnosis not present

## 2020-04-22 DIAGNOSIS — I480 Paroxysmal atrial fibrillation: Secondary | ICD-10-CM

## 2020-04-22 DIAGNOSIS — I130 Hypertensive heart and chronic kidney disease with heart failure and stage 1 through stage 4 chronic kidney disease, or unspecified chronic kidney disease: Secondary | ICD-10-CM

## 2020-04-22 DIAGNOSIS — Z6832 Body mass index (BMI) 32.0-32.9, adult: Secondary | ICD-10-CM

## 2020-04-22 DIAGNOSIS — E039 Hypothyroidism, unspecified: Secondary | ICD-10-CM | POA: Diagnosis not present

## 2020-04-22 DIAGNOSIS — Z0000 Encounter for general adult medical examination without abnormal findings: Secondary | ICD-10-CM | POA: Diagnosis not present

## 2020-04-22 LAB — POCT URINALYSIS DIPSTICK
Bilirubin, UA: NEGATIVE
Blood, UA: NEGATIVE
Glucose, UA: NEGATIVE
Ketones, UA: NEGATIVE
Leukocytes, UA: NEGATIVE
Nitrite, UA: NEGATIVE
Protein, UA: POSITIVE — AB
Spec Grav, UA: 1.02 (ref 1.010–1.025)
Urobilinogen, UA: 1 E.U./dL
pH, UA: 5.5 (ref 5.0–8.0)

## 2020-04-22 LAB — POCT UA - MICROALBUMIN
Albumin/Creatinine Ratio, Urine, POC: >300
Creatinine, POC: 100 mg/dL
Microalbumin Ur, POC: 150 mg/L

## 2020-04-22 MED ORDER — CARVEDILOL 6.25 MG PO TABS: ORAL_TABLET | ORAL | 0 refills | Status: DC

## 2020-04-22 MED ORDER — BIDIL 20-37.5 MG PO TABS: 1.0000 | ORAL_TABLET | Freq: Three times a day (TID) | ORAL | 3 refills | Status: DC

## 2020-04-22 MED ORDER — ROSUVASTATIN CALCIUM 20 MG PO TABS: 20.0000 mg | ORAL_TABLET | Freq: Every day | ORAL | 2 refills | Status: DC

## 2020-04-22 MED ORDER — APIXABAN 5 MG PO TABS: 5.0000 mg | ORAL_TABLET | Freq: Two times a day (BID) | ORAL | 5 refills | Status: DC

## 2020-04-22 MED ORDER — FUROSEMIDE 40 MG PO TABS: 60.0000 mg | ORAL_TABLET | Freq: Every day | ORAL | 1 refills | Status: DC

## 2020-04-22 NOTE — Patient Instructions (Signed)
Eric [name] , Thank you for taking time to come for your Medicare Wellness Visit. I appreciate your ongoing commitment to your health goals. Please review the following plan we discussed and let me know if I can assist you in the future.   Screening recommendations/referrals: Colonoscopy: completed 04/11/2013 Recommended yearly ophthalmology/optometry visit for glaucoma screening and checkup Recommended yearly dental visit for hygiene and checkup  Vaccinations: Influenza vaccine: completed 07/01/2019 Pneumococcal vaccine: ordered by PCP Tdap vaccine: completed 02/24/2017 Shingles vaccine: discussed   Covid-19: 10/30/2019, 10/09/2019  Advanced directives: Advance directive discussed with you today. Even though you declined this today please call our office should you change your mind and we can give you the proper paperwork for you to fill out.   Conditions/risks identified: none  Next appointment: Follow up in one year for your annual wellness visit. 04/28/2021 at 4:00  Preventive Care 68 Years and Older, Male Preventive care refers to lifestyle choices and visits with your health care provider that can promote health and wellness. What does preventive care include?  A yearly physical exam. This is also called an annual well check.  Dental exams once or twice a year.  Routine eye exams. Ask your health care provider how often you should have your eyes checked.  Personal lifestyle choices, including:  Daily care of your teeth and gums.  Regular physical activity.  Eating a healthy diet.  Avoiding tobacco and drug use.  Limiting alcohol use.  Practicing safe sex.  Taking low doses of aspirin every day.  Taking vitamin and mineral supplements as recommended by your health care provider. What happens during an annual well check? The services and screenings done by your health care provider during your annual well check will depend on your age, overall health, lifestyle risk  factors, and family history of disease. Counseling  Your health care provider may ask you questions about your:  Alcohol use.  Tobacco use.  Drug use.  Emotional well-being.  Home and relationship well-being.  Sexual activity.  Eating habits.  History of falls.  Memory and ability to understand (cognition).  Work and work Statistician. Screening  You may have the following tests or measurements:  Height, weight, and BMI.  Blood pressure.  Lipid and cholesterol levels. These may be checked every 5 years, or more frequently if you are over 25 years old.  Skin check.  Lung cancer screening. You may have this screening every year starting at age 72 if you have a 30-pack-year history of smoking and currently smoke or have quit within the past 15 years.  Fecal occult blood test (FOBT) of the stool. You may have this test every year starting at age 25.  Flexible sigmoidoscopy or colonoscopy. You may have a sigmoidoscopy every 5 years or a colonoscopy every 10 years starting at age 23.  Prostate cancer screening. Recommendations will vary depending on your family history and other risks.  Hepatitis C blood test.  Hepatitis B blood test.  Sexually transmitted disease (STD) testing.  Diabetes screening. This is done by checking your blood sugar (glucose) after you have not eaten for a while (fasting). You may have this done every 1-3 years.  Abdominal aortic aneurysm (AAA) screening. You may need this if you are a current or former smoker.  Osteoporosis. You may be screened starting at age 47 if you are at high risk. Talk with your health care provider about your test results, treatment options, and if necessary, the need for more tests. Vaccines  Your health care provider may recommend certain vaccines, such as:  Influenza vaccine. This is recommended every year.  Tetanus, diphtheria, and acellular pertussis (Tdap, Td) vaccine. You may need a Td booster every 10  years.  Zoster vaccine. You may need this after age 65.  Pneumococcal 13-valent conjugate (PCV13) vaccine. One dose is recommended after age 14.  Pneumococcal polysaccharide (PPSV23) vaccine. One dose is recommended after age 45. Talk to your health care provider about which screenings and vaccines you need and how often you need them. This information is not intended to replace advice given to you by your health care provider. Make sure you discuss any questions you have with your health care provider. Document Released: 10/08/2015 Document Revised: 05/31/2016 Document Reviewed: 07/13/2015 Elsevier Interactive Patient Education  2017 [place] Prevention in the Home Falls can cause injuries. They can happen to people of all ages. There are many things you can do to make your home safe and to help prevent falls. What can I do on the outside of my home?  Regularly fix the edges of walkways and driveways and fix any cracks.  Remove anything that might make you trip as you walk through a door, such as a raised step or threshold.  Trim any bushes or trees on the path to your home.  Use bright outdoor lighting.  Clear any walking paths of anything that might make someone trip, such as rocks or tools.  Regularly check to see if handrails are loose or broken. Make sure that both sides of any steps have handrails.  Any raised decks and porches should have guardrails on the edges.  Have any leaves, snow, or ice cleared regularly.  Use sand or salt on walking paths during winter.  Clean up any spills in your garage right away. This includes oil or grease spills. What can I do in the bathroom?  Use night lights.  Install grab bars by the toilet and in the tub and shower. Do not use towel bars as grab bars.  Use non-skid mats or decals in the tub or shower.  If you need to sit down in the shower, use a plastic, non-slip stool.  Keep the floor dry. Clean up any water that  spills on the floor as soon as it happens.  Remove soap buildup in the tub or shower regularly.  Attach bath mats securely with double-sided non-slip rug tape.  Do not have throw rugs and other things on the floor that can make you trip. What can I do in the bedroom?  Use night lights.  Make sure that you have a light by your bed that is easy to reach.  Do not use any sheets or blankets that are too big for your bed. They should not hang down onto the floor.  Have a firm chair that has side arms. You can use this for support while you get dressed.  Do not have throw rugs and other things on the floor that can make you trip. What can I do in the kitchen?  Clean up any spills right away.  Avoid walking on wet floors.  Keep items that you use a lot in easy-to-reach places.  If you need to reach something above you, use a strong step stool that has a grab bar.  Keep electrical cords out of the way.  Do not use floor polish or wax that makes floors slippery. If you must use wax, use non-skid floor wax.  Do  not have throw rugs and other things on the floor that can make you trip. What can I do with my stairs?  Do not leave any items on the stairs.  Make sure that there are handrails on both sides of the stairs and use them. Fix handrails that are broken or loose. Make sure that handrails are as long as the stairways.  Check any carpeting to make sure that it is firmly attached to the stairs. Fix any carpet that is loose or worn.  Avoid having throw rugs at the top or bottom of the stairs. If you do have throw rugs, attach them to the floor with carpet tape.  Make sure that you have a light switch at the top of the stairs and the bottom of the stairs. If you do not have them, ask someone to add them for you. What else can I do to help prevent falls?  Wear shoes that:  Do not have high heels.  Have rubber bottoms.  Are comfortable and fit you well.  Are closed at the  toe. Do not wear sandals.  If you use a stepladder:  Make sure that it is fully opened. Do not climb a closed stepladder.  Make sure that both sides of the stepladder are locked into place.  Ask someone to hold it for you, if possible.  Clearly mark and make sure that you can see:  Any grab bars or handrails.  First and last steps.  Where the edge of each step is.  Use tools that help you move around (mobility aids) if they are needed. These include:  Canes.  Walkers.  Scooters.  Crutches.  Turn on the lights when you go into a dark area. Replace any light bulbs as soon as they burn out.  Set up your furniture so you have a clear path. Avoid moving your furniture around.  If any of your floors are uneven, fix them.  If there are any pets around you, be aware of where they are.  Review your medicines with your doctor. Some medicines can make you feel dizzy. This can increase your chance of falling. Ask your doctor what other things that you can do to help prevent falls. This information is not intended to replace advice given to you by your health care provider. Make sure you discuss any questions you have with your health care provider. Document Released: 07/08/2009 Document Revised: 02/17/2016 Document Reviewed: 10/16/2014 Elsevier Interactive Patient Education  2017 [employer].

## 2020-04-22 NOTE — Progress Notes (Signed)
This visit occurred during the SARS-CoV-2 public health emergency.  Safety protocols were in place, including screening questions prior to the visit, additional usage of staff PPE, and extensive cleaning of exam room while observing appropriate contact time as indicated for disinfecting solutions.  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  Review of Systems     Cardiac Risk Factors include: advanced age (>34men, >59 women);diabetes mellitus;male gender;hypertension;obesity (BMI >30kg/m2);sedentary lifestyle     Objective:    Today's Vitals   04/22/20 1638  BP: 116/74  Pulse: 70  Temp: 98.1 F (36.7 C)  TempSrc: Oral  Weight: (!) 227 lb 12.8 oz (103.3 kg)  Height: 5\' 9"  (1.753 m)   Body mass index is 33.64 kg/m.  Advanced Directives 04/22/2020 04/09/2019 07/26/2018 04/26/2016 03/23/2016 02/22/2016 02/08/2016  Does Patient Have a Medical Advance Directive? No No No No No No No  Would patient like information on creating a medical advance directive? No - Patient declined - Yes (MAU/Ambulatory/Procedural Areas - Information given) Yes - Educational materials given - Yes - Educational materials given -  Pre-existing out of facility DNR order (yellow form or pink MOST form) - - - - - - -    Current Medications (verified) Outpatient Encounter Medications as of 04/22/2020  Medication Sig  . acetaminophen (TYLENOL) 500 MG tablet Take 1,500 mg by mouth every 4 (four) hours as needed for moderate pain or headache.   Marland Kitchen amiodarone (PACERONE) 100 MG tablet Take 1 tablet (100 mg total) by mouth daily.  Marland Kitchen amiodarone (PACERONE) 200 MG tablet Take 0.5 tablets (100 mg total) by mouth daily.  Marland Kitchen docusate sodium (COLACE) 100 MG capsule Take 100 mg by mouth daily as needed for mild constipation.  . fluticasone (FLONASE) 50 MCG/ACT nasal spray Place 2 sprays into the nose as needed for allergies.   . Hypromellose (ARTIFICIAL  TEARS OP) Place 1 drop into both eyes daily as needed (dry eyes).  Marland Kitchen ipratropium (ATROVENT) 0.06 % nasal spray Place 2 sprays into both nostrils 2 (two) times daily. (Patient not taking: Reported on 04/22/2020)  . levothyroxine (SYNTHROID) 75 MCG tablet TAKE 1 TABLET BY MOUTH DAILY IN THE MORNING EXCEPT TAKE 1/2 TABLET ON SUNDAYS IN THE MORNING  . potassium chloride 20 MEQ/15ML (10%) SOLN Take 15 mLs (20 mEq total) by mouth daily.  . vitamin B-12 (CYANOCOBALAMIN) 100 MCG tablet Take 100 mcg by mouth daily.   . vitamin C (ASCORBIC ACID) 500 MG tablet Take 500 mg by mouth as needed.   . [DISCONTINUED] carvedilol (COREG) 6.25 MG tablet TAKE 1 TABLET(6.25 MG) BY MOUTH TWICE DAILY  . [DISCONTINUED] ELIQUIS 5 MG TABS tablet TAKE 1 TABLET BY MOUTH TWICE DAILY  . [DISCONTINUED] furosemide (LASIX) 40 MG tablet Take 1.5 tablets (60 mg total) by mouth daily. (Patient taking differently: Take 60 mg by mouth daily. Takes 40 mg some days)  . [DISCONTINUED] isosorbide-hydrALAZINE (BIDIL) 20-37.5 MG tablet Take 1 tablet by mouth 3 (three) times daily.  . [DISCONTINUED] rosuvastatin (CRESTOR) 20 MG tablet Take 1 tablet (20 mg total) by mouth daily.   No facility-administered encounter medications on file as of 04/22/2020.    Allergies (verified) Lactose intolerance (gi)   History: Past Medical History:  Diagnosis Date  . Acute blood loss anemia   . Acute encephalopathy   . Acute hypoxemic respiratory failure ([place])   . Acute idiopathic gout of right ankle   . Acute lumbar back pain   .  Acute on chronic systolic heart failure ([place])   . Acute respiratory failure ([place])   . AKI (acute kidney injury) ([place])   . Altered mental status   . Aortic aneurysm without rupture ([place]) 02/09/2016  . Aortic root enlargement ([place]) 02/13/2012  . Aortic valve regurgitation, acquired 02/13/2012  . Arrhythmia   . Atrial flutter ([place]) 03/18/2012  . Back pain   . Cardiac arrest ([place]) 02/01/2016  . CHF (congestive heart failure)  ([place])   . Chronic anticoagulation 03/04/2013  . Chronic kidney disease    kidney fx studies increased   . Chronic lower back pain   . Chronic renal insufficiency   . Chronic systolic heart failure ([place]) 02/13/2012   Recent diagnosis 4 / 2013, LVEF 25% by Echo 12/2011  03/2013: Echo at [facility] Conclusions: 1. Left ventricular ejection fraction estimated by 2D at 40-45 percent. 2. Mild concentric left ventricular hypertrophy. 3. Mild left atrial enlargement. 4. Moderate aortic valve regurgitation. 5. The aortic root at the sinus(es) of valsalva is moderately dilated 6. Mild mitral valve regurgitation. 7.  . CKD (chronic kidney disease)   . CKD (chronic kidney disease), stage IV ([place]) 08/06/2013   Creatinine 2.4 on 07/04/13   . Claustrophobia   . Colon cancer screening 03/04/2013  . Debility 02/22/2016  . Diabetes mellitus type 2 in nonobese (HCC)   . Diabetes mellitus type 2 in obese ([place])   . Dysrhythmia    "palpitations"  . Encounter for central line placement   . Exertional dyspnea 01/2012  . 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 HCAP (healthcare-associated pneumonia)   . Heart murmur   . Hyperlipidemia 02/13/2012  . Hypertension   . Hypothyroidism   . Internal hemorrhoids without mention of complication 0/63/0160  . Labile blood pressure   . Left bundle branch block 02/13/2012  . Leg weakness, bilateral   . Long term (current) use of anticoagulants 08/06/2013   Eliquis therapy   . Long term current use of amiodarone 08/07/2016  . Lower extremity weakness   . Migraine 02/13/12   "opthalmic"  . Non-traumatic rhabdomyolysis   . Obesity (BMI 30-39.9) 02/13/2012  . Pain   . Paroxysmal atrial fibrillation (HCC)   . Pneumonia   . Retroperitoneal bleed   . Right ankle pain   . Right knee pain   . Severe aortic regurgitation 02/09/2016  . Special screening for malignant neoplasms, colon 04/11/2013  . Thyroid activity decreased   . Tibial pain   . Varicose vein  of leg    right  . Ventricular fibrillation ([place]) 02/09/2016  . Weakness of both lower extremities    Past Surgical History:  Procedure Laterality Date  . AORTIC VALVE REPLACEMENT  11/20/2018  . CARDIAC CATHETERIZATION N/A 02/17/2016   Procedure: Left Heart Cath and Coronary Angiography;  Surgeon: Jettie Booze, MD;  Location: [place] [facility];  Service: Cardiovascular;  Laterality: N/A;  . CARDIOVERSION  03/22/2012   Procedure: CARDIOVERSION;  Surgeon: Candee Furbish, MD;  Location: [facility] ENDOSCOPY;  Service: Cardiovascular;  Laterality: N/A;  . CARDIOVERSION  04/19/2012   Procedure: CARDIOVERSION;  Surgeon: Sinclair Grooms, MD;  Location: [facility];  Service: Cardiovascular;  Laterality: N/A;  . COLONOSCOPY N/A 04/11/2013   Procedure: COLONOSCOPY;  Surgeon: Inda Castle, MD;  Location: [facility] ENDOSCOPY;  Service: Endoscopy;  Laterality: N/A;  . COLONOSCOPY N/A 04/11/2013   Procedure: COLONOSCOPY;  Surgeon: Inda Castle, MD;  Location: [facility] ENDOSCOPY;  Service: Endoscopy;  Laterality: N/A;  . EP IMPLANTABLE DEVICE N/A 02/17/2016   Procedure: BiV ICD Insertion CRT-D;  Surgeon: Evans Lance, MD;  Location: [place] [facility];  Service: Cardiovascular;  Laterality: N/A;  . FINGER SURGERY  2012   "4th digit right hand; thumb on left hand"  . RADIOLOGY WITH ANESTHESIA N/A 02/11/2016   Procedure: MRI OF THE BRAIN WITHOUT CONTRAST, LUMBAR WITHOUT CONTRAST;  Surgeon: Medication Radiologist, MD;  Location: [facility];  Service: Radiology;  Laterality: N/A;  DR. WOOD/MRI  . RIGHT/LEFT HEART CATH AND CORONARY ANGIOGRAPHY N/A 07/26/2018   Procedure: RIGHT/LEFT HEART CATH AND CORONARY ANGIOGRAPHY;  Surgeon: Belva Crome, MD;  Location: [place] [facility];  Service: Cardiovascular;  Laterality: N/A;  . Skin melanocytoma excision  2012   "above left clavicle"  . STERNAL INCISION RECLOSURE  11/2018  . STERNAL WIRE REMOVAL  11/2018  . STERNAL WOUND DEBRIDEMENT  11/2018  . TEE WITHOUT CARDIOVERSION  03/22/2012    Procedure: TRANSESOPHAGEAL ECHOCARDIOGRAM (TEE);  Surgeon: Candee Furbish, MD;  Location: [facility] ENDOSCOPY;  Service: Cardiovascular;  Laterality: N/A;  . TEE WITHOUT CARDIOVERSION N/A 02/08/2016   Procedure: TRANSESOPHAGEAL ECHOCARDIOGRAM (TEE);  Surgeon: Lelon Perla, MD;  Location: [facility] ENDOSCOPY;  Service: Cardiovascular;  Laterality: N/A;   Family History  Problem Relation Age of Onset  . Hypertension Mother   . Heart disease Mother   . Heart failure Mother   . Diabetes Mother   . Hypertension Father   . Heart disease Father   . Heart failure Father    Social History   Socioeconomic History  . Marital status: Married    Spouse name: [name]  . Number of children: 1  . Years of education: Not on file  . Highest education level: Not on file  Occupational History    Employer: [employer]  Tobacco Use  . Smoking status: Never Smoker  . Smokeless tobacco: Never Used  Vaping Use  . Vaping Use: Never used  Substance and Sexual Activity  . Alcohol use: Yes    Comment: rarely  . Drug use: No  . Sexual activity: Not Currently  Other Topics Concern  . 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   Social Determinants of Health   Financial Resource Strain: Low Risk   . Difficulty of Paying Living Expenses: Not hard at all  Food Insecurity: No Food Insecurity  . Worried About Charity fundraiser in the Last Year: Never true  . Ran Out of Food in the Last Year: Never true  Transportation Needs: No Transportation Needs  . Lack of Transportation (Medical): No  . Lack of Transportation (Non-Medical): No  Physical Activity: Inactive  . Days of Exercise per Week: 0 days  . Minutes of Exercise per Session: 0 min  Stress: No Stress Concern Present  . Feeling of Stress : Not at all  Social Connections:   . Frequency of Communication with Friends and Family:   . Frequency of Social Gatherings with  Friends and Family:   . Attends Religious Services:   . Active Member of Clubs or Organizations:   . Attends Archivist Meetings:   Marland Kitchen Marital Status:     Tobacco Counseling Counseling given: Not Answered   Clinical Intake:  Pre-visit preparation completed: Yes  Pain : No/denies pain     Nutritional Status: BMI > 30  Obese Nutritional Risks: None  Diabetes: Yes  How often do you need to have someone help you when you read instructions, pamphlets, or other written materials from your doctor or pharmacy?: 1 - Never What is the last grade level you completed in school?: MD  Diabetic? Yes Nutrition Risk Assessment:  Has the patient had any N/V/D within the last 2 months?  No  Does the patient have any non-healing wounds?  No  Has the patient had any unintentional weight loss or weight gain?  No   Diabetes:  Is the patient diabetic?  Yes  If diabetic, was a CBG obtained today?  No  Did the patient bring in their glucometer from home?  No  How often do you monitor your CBG's? Don't check much.   Financial Strains and Diabetes Management:  Are you having any financial strains with the device, your supplies or your medication? No .  Does the patient want to be seen by Chronic Care Management for management of their diabetes?  No  Would the patient like to be referred to a Nutritionist or for Diabetic Management?  No   Diabetic Exams:  Diabetic Eye Exam: Overdue for diabetic eye exam. Pt has been advised about the importance in completing this exam. Patient advised to call and schedule an eye exam. Diabetic Foot Exam: Completed 02/17/2020   Interpreter Needed?: No  Information entered by :: NAllen LPN   Activities of Daily Living In your present state of health, do you have any difficulty performing the following activities: 04/22/2020 04/22/2020  Hearing? N N  Vision? N N  Difficulty concentrating or making decisions? N N  Walking or climbing stairs? N Y    Dressing or bathing? N N  Doing errands, shopping? N N  Preparing Food and eating ? N -  Using the Toilet? N -  In the past six months, have you accidently leaked urine? N -  Do you have problems with loss of bowel control? N -  Managing your Medications? N -  Managing your Finances? N -  Housekeeping or managing your Housekeeping? N -  Some recent data might be hidden    Patient Care Team: Glendale Chard, MD as PCP - General (Internal Medicine) Belva Crome, MD as PCP - Cardiology (Cardiology) Evans Lance, MD as PCP - Electrophysiology (Cardiology) Belva Crome, MD as Consulting Physician (Cardiology)  Indicate any recent Medical Services you may have received from other than [facility] providers in the past year (date may be approximate).     Assessment:   This is a routine wellness examination for [place].  Hearing/Vision screen  Hearing Screening   125Hz  250Hz  500Hz  1000Hz  2000Hz  3000Hz  4000Hz  6000Hz  8000Hz   Right ear:           Left ear:           Vision Screening Comments: No regular eye exams, Dr. Zadie Rhine  Dietary issues and exercise activities discussed: Current Exercise Habits: The patient does not participate in regular exercise at present  Goals    . Patient Stated     To be able to go up and down flights of stairs with less shortness of breathe.     . Patient Stated     [facility]22/2020[facility]    . Weight (lb) < 200 lb (90.7 kg)     Wants to lose 15-20 pounds.      Depression Screen PHQ 2/9 Scores 04/22/2020 04/22/2020 04/09/2019 10/09/2018 12/18/2016  PHQ -  2 Score 0 0 0 0 0  PHQ- 9 Score 1 - 2 - -    Fall Risk Fall Risk  04/22/2020 05/13/2019 04/09/2019 10/09/2018 12/14/2016  Falls in the past year? 0 0 0 0 Yes  Number falls in past yr: - - 0 - 1  Injury with Fall? - - - - Yes  Comment - - - - R knee contusion  Risk for fall due to : Medication side effect - Medication side effect - Impaired balance/gait  Follow up Falls evaluation  completed;Education provided;Falls prevention discussed - Falls evaluation completed;Falls prevention discussed - -    Any stairs in or around the home? Yes  If so, are there any without handrails? Yes  Home free of loose throw rugs in walkways, pet beds, electrical cords, etc? Yes  Adequate lighting in your home to reduce risk of falls? Yes   ASSISTIVE DEVICES UTILIZED TO PREVENT FALLS:  Life alert? No  Use of a cane, walker or w/c? No  Grab bars in the bathroom? Yes  Shower chair or bench in shower? Yes  Elevated toilet seat or a handicapped toilet? Yes   TIMED UP AND GO:  Was the test performed? No .   Gait steady and fast without use of assistive device  Cognitive Function:     6CIT Screen 04/22/2020 04/09/2019  What Year? 0 points 0 points  What month? 0 points 0 points  What time? 0 points 0 points  Count back from 20 0 points 0 points  Months in reverse 0 points 2 points  Repeat phrase 4 points 0 points  Total Score 4 2    Immunizations Immunization History  Administered Date(s) Administered  . Influenza, High Dose Seasonal PF 07/01/2019  . Influenza-Unspecified 04/25/2018  . PFIZER SARS-COV-2 Vaccination 10/09/2019, 10/30/2019    TDAP status: Up to date Flu Vaccine status: Up to date Pneumococcal vaccine status: PCP ordering Covid-19 vaccine status: Completed vaccines  Qualifies for Shingles Vaccine? Yes   Zostavax completed No   Shingrix Completed?: No.    Education has been provided regarding the importance of this vaccine. Patient has been advised to call insurance company to determine out of pocket expense if they have not yet received this vaccine. Advised may also receive vaccine at local pharmacy or Health Dept. Verbalized acceptance and understanding.  Screening Tests Health Maintenance  Topic Date Due  . OPHTHALMOLOGY EXAM  Never done  . URINE MICROALBUMIN  Never done  . PNA vac Low Risk Adult (1 of 2 - PCV13) Never done  . INFLUENZA VACCINE   04/25/2020  . HEMOGLOBIN A1C  10/23/2020  . FOOT EXAM  02/16/2021  . COLONOSCOPY  04/12/2023  . TETANUS/TDAP  02/25/2027  . COVID-19 Vaccine  Completed  . Hepatitis C Screening  Completed    Health Maintenance  Health Maintenance Due  Topic Date Due  . OPHTHALMOLOGY EXAM  Never done  . URINE MICROALBUMIN  Never done  . PNA vac Low Risk Adult (1 of 2 - PCV13) Never done    Colorectal cancer screening: Completed 04/11/2013. Repeat every 10 years  Lung Cancer Screening: (Low Dose CT Chest recommended if Age 94-80 years, 30 pack-year currently smoking OR have quit w/in 15years.) does not qualify.   Lung Cancer Screening Referral: no  Additional Screening:  Hepatitis C Screening: does qualify; Completed 02/24/2012  Vision Screening: Recommended annual ophthalmology exams for early detection of glaucoma and other disorders of the eye. Is the patient up to date with  their annual eye exam?  No  Who is the provider or what is the name of the office in which the patient attends annual eye exams? Dr. Zadie Rhine If pt is not established with a provider, would they like to be referred to a provider to establish care? No .   Dental Screening: Recommended annual dental exams for proper oral hygiene  Community Resource Referral / Chronic Care Management: CRR required this visit?  No   CCM required this visit?  No      Plan:     I have personally reviewed and noted the following in the patient's chart:   . Medical and social history . Use of alcohol, tobacco or illicit drugs  . Current medications and supplements . Functional ability and status . Nutritional status . Physical activity . Advanced directives . List of other physicians . Hospitalizations, surgeries, and ER visits in previous 12 months . Vitals . Screenings to include cognitive, depression, and falls . Referrals and appointments  In addition, I have reviewed and discussed with patient certain preventive protocols,  quality metrics, and best practice recommendations. A written personalized care plan for preventive services as well as general preventive health recommendations were provided to patient.     Kellie Simmering, LPN   1/96/2229   Nurse Notes:

## 2020-04-22 NOTE — Progress Notes (Signed)
I,Katawbba Wiggins,acting as a Education administrator for [name], MD.,have documented all relevant documentation on the behalf of [name], MD,as directed by  [name], MD while in the presence of [name], MD. This visit occurred during the SARS-CoV-2 public health emergency.  Safety protocols were in place, including screening questions prior to the visit, additional usage of staff PPE, and extensive cleaning of exam room while observing appropriate contact time as indicated for disinfecting solutions.  Subjective:     Patient ID: Eric [name]., MD , male    DOB: 1951/05/30 , 69 y.o.   MRN: [id number]   Chief Complaint  Patient presents with  . Diabetes  . Hypertension    HPI  The patient is here for a follow-up on his diabetes and blood pressure. He was last seen in 2020, requested July 2021 appt due to insurance. HE is not taking any meds for diabetes, he has been able to control with lifestyle changes.   Diabetes He presents for his follow-up diabetic visit. He has type 2 diabetes mellitus. There are no hypoglycemic associated symptoms. Pertinent negatives for diabetes include no blurred vision and no chest pain. There are no hypoglycemic complications. He is following a diabetic diet. Meal planning includes avoidance of concentrated sweets. He participates in exercise intermittently. His breakfast blood glucose is taken between 8-9 am. His breakfast blood glucose range is generally 110-130 mg/dl. An ACE inhibitor/angiotensin II receptor blocker is not being taken. He sees a podiatrist.Eye exam is not current.  Hypertension This is a chronic problem. The current episode started more than 1 year ago. The problem has been gradually improving since onset. The problem is controlled. Pertinent negatives include no blurred vision, chest pain, palpitations or shortness of breath. Risk factors for coronary artery disease include diabetes mellitus, dyslipidemia, sedentary lifestyle,  obesity, male gender and stress. Past treatments include beta blockers and diuretics. The current treatment provides moderate improvement. Hypertensive end-organ damage includes kidney disease.     Past Medical History:  Diagnosis Date  . Acute blood loss anemia   . Acute encephalopathy   . Acute hypoxemic respiratory failure ([place])   . Acute idiopathic gout of right ankle   . Acute lumbar back pain   . Acute on chronic systolic heart failure ([place])   . Acute respiratory failure ([place])   . AKI (acute kidney injury) ([place])   . Altered mental status   . Aortic aneurysm without rupture ([place]) 02/09/2016  . Aortic root enlargement ([place]) 02/13/2012  . Aortic valve regurgitation, acquired 02/13/2012  . Arrhythmia   . Atrial flutter ([place]) 03/18/2012  . Back pain   . Cardiac arrest ([place]) 02/01/2016  . CHF (congestive heart failure) ([place])   . Chronic anticoagulation 03/04/2013  . Chronic kidney disease    kidney fx studies increased   . Chronic lower back pain   . Chronic renal insufficiency   . Chronic systolic heart failure ([place]) 02/13/2012   Recent diagnosis 4 / 2013, LVEF 25% by Echo 12/2011  03/2013: Echo at [facility] Conclusions: 1. Left ventricular ejection fraction estimated by 2D at 40-45 percent. 2. Mild concentric left ventricular hypertrophy. 3. Mild left atrial enlargement. 4. Moderate aortic valve regurgitation. 5. The aortic root at the sinus(es) of valsalva is moderately dilated 6. Mild mitral valve regurgitation. 7.  . CKD (chronic kidney disease)   . CKD (chronic kidney disease), stage IV ([place]) 08/06/2013   Creatinine 2.4 on 07/04/13   . Claustrophobia   .  Colon cancer screening 03/04/2013  . Debility 02/22/2016  . Diabetes mellitus type 2 in nonobese (HCC)   . Diabetes mellitus type 2 in obese ([place])   . Dysrhythmia    "palpitations"  . Encounter for central line placement   . Exertional dyspnea 01/2012  . 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 HCAP  (healthcare-associated pneumonia)   . Heart murmur   . Hyperlipidemia 02/13/2012  . Hypertension   . Hypothyroidism   . Internal hemorrhoids without mention of complication 9/79/8921  . Labile blood pressure   . Left bundle branch block 02/13/2012  . Leg weakness, bilateral   . Long term (current) use of anticoagulants 08/06/2013   Eliquis therapy   . Long term current use of amiodarone 08/07/2016  . Lower extremity weakness   . Migraine 02/13/12   "opthalmic"  . Non-traumatic rhabdomyolysis   . Obesity (BMI 30-39.9) 02/13/2012  . Pain   . Paroxysmal atrial fibrillation (HCC)   . Pneumonia   . Retroperitoneal bleed   . Right ankle pain   . Right knee pain   . Severe aortic regurgitation 02/09/2016  . Special screening for malignant neoplasms, colon 04/11/2013  . Thyroid activity decreased   . Tibial pain   . Varicose vein of leg    right  . Ventricular fibrillation ([place]) 02/09/2016  . Weakness of both lower extremities      Family History  Problem Relation Age of Onset  . Hypertension Mother   . Heart disease Mother   . Heart failure Mother   . Diabetes Mother   . Hypertension Father   . Heart disease Father   . Heart failure Father      Current Outpatient Medications:  .  acetaminophen (TYLENOL) 500 MG tablet, Take 1,500 mg by mouth every 4 (four) hours as needed for moderate pain or headache. , Disp: , Rfl:  .  amiodarone (PACERONE) 100 MG tablet, Take 1 tablet (100 mg total) by mouth daily., Disp: 90 tablet, Rfl: 3 .  amiodarone (PACERONE) 200 MG tablet, Take 0.5 tablets (100 mg total) by mouth daily., Disp: 45 tablet, Rfl: 1 .  apixaban (ELIQUIS) 5 MG TABS tablet, Take 1 tablet (5 mg total) by mouth 2 (two) times daily., Disp: 60 tablet, Rfl: 5 .  carvedilol (COREG) 6.25 MG tablet, TAKE 1 TABLET(6.25 MG) BY MOUTH TWICE DAILY, Disp: 60 tablet, Rfl: 0 .  docusate sodium (COLACE) 100 MG capsule, Take 100 mg by mouth daily as needed for mild constipation., Disp: , Rfl:  .   fluticasone (FLONASE) 50 MCG/ACT nasal spray, Place 2 sprays into the nose as needed for allergies. , Disp: , Rfl:  .  furosemide (LASIX) 40 MG tablet, Take 1.5 tablets (60 mg total) by mouth daily., Disp: 135 tablet, Rfl: 1 .  Hypromellose (ARTIFICIAL TEARS OP), Place 1 drop into both eyes daily as needed (dry eyes)., Disp: , Rfl:  .  isosorbide-hydrALAZINE (BIDIL) 20-37.5 MG tablet, Take 1 tablet by mouth 3 (three) times daily., Disp: 270 tablet, Rfl: 3 .  levothyroxine (SYNTHROID) 75 MCG tablet, TAKE 1 TABLET BY MOUTH DAILY IN THE MORNING EXCEPT TAKE 1/2 TABLET ON SUNDAYS IN THE MORNING, Disp: 90 tablet, Rfl: 0 .  potassium chloride 20 MEQ/15ML (10%) SOLN, Take 15 mLs (20 mEq total) by mouth daily., Disp: 473 mL, Rfl: 6 .  rosuvastatin (CRESTOR) 20 MG tablet, Take 1 tablet (20 mg total) by mouth daily., Disp: 90 tablet, Rfl: 2 .  vitamin  B-12 (CYANOCOBALAMIN) 100 MCG tablet, Take 100 mcg by mouth daily. , Disp: , Rfl:  .  vitamin C (ASCORBIC ACID) 500 MG tablet, Take 500 mg by mouth as needed. , Disp: , Rfl:  .  ipratropium (ATROVENT) 0.06 % nasal spray, Place 2 sprays into both nostrils 2 (two) times daily. (Patient not taking: Reported on 04/22/2020), Disp: 15 mL, Rfl: 3   Allergies  Allergen Reactions  . Lactose Intolerance (Gi) Diarrhea     Review of Systems  Constitutional: Negative.   Eyes: Negative for blurred vision.  Respiratory: Negative.  Negative for shortness of breath.   Cardiovascular: Negative.  Negative for chest pain and palpitations.  Gastrointestinal: Negative.   Psychiatric/Behavioral: Negative.   All other systems reviewed and are negative.    Today's Vitals   04/22/20 1632  BP: 116/74  Pulse: 70  Temp: 98.1 F (36.7 C)  TempSrc: Oral  Weight: (!) 227 lb 12.8 oz (103.3 kg)  Height: _0  (1.753 m)  PainSc: 0-No pain   Body mass index is 33.64 kg/m.  Wt Readings from Last 3 Encounters:  04/22/20 (!) 227 lb 12.8 oz (103.3 kg)  04/22/20 (!) 227 lb 12.8  oz (103.3 kg)  10/21/19 223 lb 12.8 oz (101.5 kg)   Objective:  Physical Exam Vitals and nursing note reviewed.  Constitutional:      Appearance: Normal appearance. He is obese.  HENT:     Head: Normocephalic and atraumatic.  Cardiovascular:     Rate and Rhythm: Normal rate. Rhythm irregular.     Heart sounds: Murmur heard.   Pulmonary:     Breath sounds: Normal breath sounds.  Skin:    General: Skin is warm.  Neurological:     General: No focal deficit present.     Mental Status: He is alert and oriented to person, place, and time.         Assessment And Plan:     1. Type 2 diabetes mellitus with stage 3 chronic kidney disease, without long-term current use of insulin, unspecified whether stage 3a or 3b CKD ([place]) Comments: Chronic, I will check  labs as listed below. I will also check Renal labs, he has not been seen by Nephrologist recently.  - CMP14+EGFR - Hemoglobin A1c - Protein electrophoresis, serum - CBC with Diff - Phosphorus - Parathyroid Hormone, Intact w/Ca  2. Hypertensive heart and renal disease with renal failure, stage 1 through stage 4 or unspecified chronic kidney disease, with heart failure ([place]) Comments: Chronic, well controlled. He will continue with current meds. He is encouraged to avoid adding salt to his foods.   3. Paroxysmal atrial fibrillation (HCC) Comments: Chronic, he is rate controlled and properly anticoagulated,   4. Hypothyroidism (acquired)  Chronic, I will check thyroid panel and adjust meds as needed.  - TSH - T4, Free  5. Class 1 obesity due to excess calories with serious comorbidity and body mass index (BMI) of 32.0 to 32.9 in adult He is encouraged to initially strive for BMI less than 30 to decrease cardiac risk. He is advised to exercise no less than 150 minutes per week.     Patient was given opportunity to ask questions. Patient verbalized understanding of the plan and was able to repeat key elements of the plan. All  questions were answered to their satisfaction.  [name], MD   I, [name], MD, have reviewed all documentation for this visit. The documentation on 04/25/20 for the exam, diagnosis, procedures,  and orders are all accurate and complete.  THE PATIENT IS ENCOURAGED TO PRACTICE SOCIAL DISTANCING DUE TO THE COVID-19 PANDEMIC.

## 2020-04-23 LAB — CBC WITH DIFFERENTIAL/PLATELET
Basophils Absolute: 0.1 10*3/uL (ref 0.0–0.2)
Basos: 1 %
EOS (ABSOLUTE): 0.2 10*3/uL (ref 0.0–0.4)
Eos: 5 %
Hematocrit: 31.5 % — ABNORMAL LOW (ref 37.5–51.0)
Hemoglobin: 10 g/dL — ABNORMAL LOW (ref 13.0–17.7)
Immature Grans (Abs): 0 10*3/uL (ref 0.0–0.1)
Immature Granulocytes: 0 %
Lymphocytes Absolute: 1.1 10*3/uL (ref 0.7–3.1)
Lymphs: 21 %
MCH: 29.2 pg (ref 26.6–33.0)
MCHC: 31.7 g/dL (ref 31.5–35.7)
MCV: 92 fL (ref 79–97)
Monocytes Absolute: 0.8 10*3/uL (ref 0.1–0.9)
Monocytes: 15 %
Neutrophils Absolute: 3.1 10*3/uL (ref 1.4–7.0)
Neutrophils: 58 %
Platelets: 224 10*3/uL (ref 150–450)
RBC: 3.42 x10E6/uL — ABNORMAL LOW (ref 4.14–5.80)
RDW: 16.2 % — ABNORMAL HIGH (ref 11.6–15.4)
WBC: 5.3 10*3/uL (ref 3.4–10.8)

## 2020-04-23 LAB — PROTEIN ELECTROPHORESIS, SERUM
A/G Ratio: 0.6 — ABNORMAL LOW (ref 0.7–1.7)
Albumin ELP: 3.5 g/dL (ref 2.9–4.4)
Alpha 1: 0.3 g/dL (ref 0.0–0.4)
Alpha 2: 0.6 g/dL (ref 0.4–1.0)
Beta: 1.1 g/dL (ref 0.7–1.3)
Gamma Globulin: 3.6 g/dL — ABNORMAL HIGH (ref 0.4–1.8)
Globulin, Total: 5.6 g/dL — ABNORMAL HIGH (ref 2.2–3.9)
M-Spike, %: 3.4 g/dL — ABNORMAL HIGH

## 2020-04-23 LAB — CMP14+EGFR
ALT: 27 IU/L (ref 0–44)
AST: 40 IU/L (ref 0–40)
Albumin/Globulin Ratio: 0.6 — ABNORMAL LOW (ref 1.2–2.2)
Albumin: 3.3 g/dL — ABNORMAL LOW (ref 3.8–4.8)
Alkaline Phosphatase: 68 IU/L (ref 48–121)
BUN/Creatinine Ratio: 11 (ref 10–24)
BUN: 24 mg/dL (ref 8–27)
Bilirubin Total: 0.6 mg/dL (ref 0.0–1.2)
CO2: 19 mmol/L — ABNORMAL LOW (ref 20–29)
Calcium: 8.3 mg/dL — ABNORMAL LOW (ref 8.6–10.2)
Chloride: 104 mmol/L (ref 96–106)
Creatinine, Ser: 2.16 mg/dL — ABNORMAL HIGH (ref 0.76–1.27)
GFR calc Af Amer: 35 mL/min/{1.73_m2} — ABNORMAL LOW (ref >59)
GFR calc non Af Amer: 30 mL/min/{1.73_m2} — ABNORMAL LOW (ref >59)
Globulin, Total: 5.8 g/dL — ABNORMAL HIGH (ref 1.5–4.5)
Glucose: 111 mg/dL — ABNORMAL HIGH (ref 65–99)
Potassium: 3.7 mmol/L (ref 3.5–5.2)
Sodium: 137 mmol/L (ref 134–144)
Total Protein: 9.1 g/dL — ABNORMAL HIGH (ref 6.0–8.5)

## 2020-04-23 LAB — T4, FREE: Free T4: 1.6 ng/dL (ref 0.82–1.77)

## 2020-04-23 LAB — PTH, INTACT AND CALCIUM: PTH: 147 pg/mL — ABNORMAL HIGH (ref 15–65)

## 2020-04-23 LAB — HEMOGLOBIN A1C
Est. average glucose Bld gHb Est-mCnc: 128 mg/dL
Hgb A1c MFr Bld: 6.1 % — ABNORMAL HIGH (ref 4.8–5.6)

## 2020-04-23 LAB — PHOSPHORUS: Phosphorus: 3.3 mg/dL (ref 2.8–4.1)

## 2020-04-23 LAB — TSH: TSH: 2.85 u[IU]/mL (ref 0.450–4.500)

## 2020-04-24 ENCOUNTER — Other Ambulatory Visit: Payer: Self-pay | Admitting: Internal Medicine

## 2020-04-24 DIAGNOSIS — R779 Abnormality of plasma protein, unspecified: Secondary | ICD-10-CM

## 2020-04-24 DIAGNOSIS — D631 Anemia in chronic kidney disease: Secondary | ICD-10-CM

## 2020-04-27 ENCOUNTER — Telehealth: Payer: Self-pay | Admitting: Hematology & Oncology

## 2020-04-27 NOTE — Telephone Encounter (Signed)
LMOM TO INFORM OF NEW PATIENT APPOINTMENT 8/11AT 1030 AM

## 2020-05-04 ENCOUNTER — Other Ambulatory Visit: Payer: Self-pay

## 2020-05-04 ENCOUNTER — Encounter: Payer: Self-pay | Admitting: Podiatry

## 2020-05-04 ENCOUNTER — Ambulatory Visit (INDEPENDENT_AMBULATORY_CARE_PROVIDER_SITE_OTHER): Payer: Medicare Other | Admitting: Podiatry

## 2020-05-04 DIAGNOSIS — D689 Coagulation defect, unspecified: Secondary | ICD-10-CM

## 2020-05-04 DIAGNOSIS — E119 Type 2 diabetes mellitus without complications: Secondary | ICD-10-CM

## 2020-05-04 DIAGNOSIS — L608 Other nail disorders: Secondary | ICD-10-CM

## 2020-05-04 DIAGNOSIS — M79674 Pain in right toe(s): Secondary | ICD-10-CM

## 2020-05-04 DIAGNOSIS — B351 Tinea unguium: Secondary | ICD-10-CM

## 2020-05-04 DIAGNOSIS — M79675 Pain in left toe(s): Secondary | ICD-10-CM

## 2020-05-04 NOTE — Progress Notes (Signed)
This patient returns to my office for at risk foot care.  This patient requires this care by a professional since this patient will be at risk due to having diabetes type 2, chronic kidney disease and coagulation defect.  Patient is taking eliquisThis patient is unable to cut nails himself since the patient cannot reach his nails.These nails are painful walking and wearing shoes.  This patient presents for at risk foot care today.  General Appearance  Alert, conversant and in no acute stress.  Vascular  Dorsalis pedis and posterior tibial  pulses are palpable  bilaterally.  Capillary return is within normal limits  bilaterally. Temperature is within normal limits  bilaterally.  Neurologic  Senn-Weinstein monofilament wire test within normal limits  bilaterally. Muscle power within normal limits bilaterally.  Nails Thick disfigured discolored nails with subungual debris  from hallux to fifth toes bilaterally. No evidence of bacterial infection or drainage bilaterally.  Orthopedic  No limitations of motion  feet .  No crepitus or effusions noted.  No bony pathology or digital deformities noted.  Skin  normotropic skin with no porokeratosis noted bilaterally.  No signs of infections or ulcers noted.     Onychomycosis  Pain in right toes  Pain in left toes  Consent was obtained for treatment procedures.   Mechanical debridement of nails 1-5  bilaterally performed with a nail nipper.  Filed with dremel without incident.    Return office visit   10 weeks                   Told patient to return for periodic foot care and evaluation due to potential at risk complications.   Verneta Hamidi DPM  

## 2020-05-05 ENCOUNTER — Encounter: Payer: Self-pay | Admitting: Hematology & Oncology

## 2020-05-05 ENCOUNTER — Encounter: Payer: Self-pay | Admitting: *Deleted

## 2020-05-05 ENCOUNTER — Inpatient Hospital Stay (HOSPITAL_BASED_OUTPATIENT_CLINIC_OR_DEPARTMENT_OTHER): Payer: Medicare Other | Admitting: Hematology & Oncology

## 2020-05-05 ENCOUNTER — Inpatient Hospital Stay: Payer: Medicare Other | Attending: Hematology & Oncology

## 2020-05-05 VITALS — BP 161/90 | HR 73 | Temp 98.1°F | Resp 18 | Ht 69.0 in | Wt 226.8 lb

## 2020-05-05 DIAGNOSIS — E1122 Type 2 diabetes mellitus with diabetic chronic kidney disease: Secondary | ICD-10-CM | POA: Diagnosis not present

## 2020-05-05 DIAGNOSIS — D631 Anemia in chronic kidney disease: Secondary | ICD-10-CM

## 2020-05-05 DIAGNOSIS — C9000 Multiple myeloma not having achieved remission: Secondary | ICD-10-CM

## 2020-05-05 DIAGNOSIS — I5022 Chronic systolic (congestive) heart failure: Secondary | ICD-10-CM

## 2020-05-05 DIAGNOSIS — Z79899 Other long term (current) drug therapy: Secondary | ICD-10-CM | POA: Insufficient documentation

## 2020-05-05 DIAGNOSIS — N184 Chronic kidney disease, stage 4 (severe): Secondary | ICD-10-CM | POA: Diagnosis not present

## 2020-05-05 DIAGNOSIS — Z8249 Family history of ischemic heart disease and other diseases of the circulatory system: Secondary | ICD-10-CM | POA: Insufficient documentation

## 2020-05-05 DIAGNOSIS — Z7901 Long term (current) use of anticoagulants: Secondary | ICD-10-CM | POA: Diagnosis not present

## 2020-05-05 DIAGNOSIS — Z833 Family history of diabetes mellitus: Secondary | ICD-10-CM | POA: Diagnosis not present

## 2020-05-05 DIAGNOSIS — Z952 Presence of prosthetic heart valve: Secondary | ICD-10-CM | POA: Insufficient documentation

## 2020-05-05 DIAGNOSIS — I252 Old myocardial infarction: Secondary | ICD-10-CM

## 2020-05-05 DIAGNOSIS — Z8719 Personal history of other diseases of the digestive system: Secondary | ICD-10-CM | POA: Insufficient documentation

## 2020-05-05 DIAGNOSIS — I130 Hypertensive heart and chronic kidney disease with heart failure and stage 1 through stage 4 chronic kidney disease, or unspecified chronic kidney disease: Secondary | ICD-10-CM | POA: Diagnosis not present

## 2020-05-05 DIAGNOSIS — Z95810 Presence of automatic (implantable) cardiac defibrillator: Secondary | ICD-10-CM | POA: Insufficient documentation

## 2020-05-05 DIAGNOSIS — I480 Paroxysmal atrial fibrillation: Secondary | ICD-10-CM | POA: Diagnosis not present

## 2020-05-05 DIAGNOSIS — Z7189 Other specified counseling: Secondary | ICD-10-CM

## 2020-05-05 DIAGNOSIS — Z8674 Personal history of sudden cardiac arrest: Secondary | ICD-10-CM

## 2020-05-05 HISTORY — DX: Multiple myeloma not having achieved remission: C90.00

## 2020-05-05 HISTORY — DX: Other specified counseling: Z71.89

## 2020-05-05 LAB — CBC WITH DIFFERENTIAL (CANCER CENTER ONLY)
Abs Immature Granulocytes: 0.01 10*3/uL (ref 0.00–0.07)
Basophils Absolute: 0.1 10*3/uL (ref 0.0–0.1)
Basophils Relative: 1 %
Eosinophils Absolute: 0.3 10*3/uL (ref 0.0–0.5)
Eosinophils Relative: 5 %
HCT: 32 % — ABNORMAL LOW (ref 39.0–52.0)
Hemoglobin: 10.4 g/dL — ABNORMAL LOW (ref 13.0–17.0)
Immature Granulocytes: 0 %
Lymphocytes Relative: 19 %
Lymphs Abs: 1 10*3/uL (ref 0.7–4.0)
MCH: 29.3 pg (ref 26.0–34.0)
MCHC: 32.5 g/dL (ref 30.0–36.0)
MCV: 90.1 fL (ref 80.0–100.0)
Monocytes Absolute: 0.8 10*3/uL (ref 0.1–1.0)
Monocytes Relative: 15 %
Neutro Abs: 3 10*3/uL (ref 1.7–7.7)
Neutrophils Relative %: 60 %
Platelet Count: 208 10*3/uL (ref 150–400)
RBC: 3.55 MIL/uL — ABNORMAL LOW (ref 4.22–5.81)
RDW: 17.6 % — ABNORMAL HIGH (ref 11.5–15.5)
WBC Count: 5.1 10*3/uL (ref 4.0–10.5)
nRBC: 0 % (ref 0.0–0.2)

## 2020-05-05 LAB — CMP (CANCER CENTER ONLY)
ALT: 32 U/L (ref 0–44)
AST: 52 U/L — ABNORMAL HIGH (ref 15–41)
Albumin: 3.1 g/dL — ABNORMAL LOW (ref 3.5–5.0)
Alkaline Phosphatase: 60 U/L (ref 38–126)
Anion gap: 5 (ref 5–15)
BUN: 27 mg/dL — ABNORMAL HIGH (ref 8–23)
CO2: 25 mmol/L (ref 22–32)
Calcium: 9.5 mg/dL (ref 8.9–10.3)
Chloride: 104 mmol/L (ref 98–111)
Creatinine: 2.15 mg/dL — ABNORMAL HIGH (ref 0.61–1.24)
GFR, Est AFR Am: 35 mL/min — ABNORMAL LOW (ref >60)
GFR, Estimated: 31 mL/min — ABNORMAL LOW (ref >60)
Glucose, Bld: 150 mg/dL — ABNORMAL HIGH (ref 70–99)
Potassium: 3.4 mmol/L — ABNORMAL LOW (ref 3.5–5.1)
Sodium: 134 mmol/L — ABNORMAL LOW (ref 135–145)
Total Bilirubin: 0.7 mg/dL (ref 0.3–1.2)
Total Protein: 9.6 g/dL — ABNORMAL HIGH (ref 6.5–8.1)

## 2020-05-05 LAB — LACTATE DEHYDROGENASE: LDH: 243 U/L — ABNORMAL HIGH (ref 98–192)

## 2020-05-05 LAB — SAVE SMEAR(SSMR), FOR PROVIDER SLIDE REVIEW

## 2020-05-05 NOTE — Progress Notes (Signed)
Referral MD  Reason for Referral: Multiple myeloma  Chief Complaint  Patient presents with  . New Patient (Initial Visit)  : I think I have myeloma.  HPI: Dr. [name] is a very nice 69 year old African-American male.  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  He is incredibly interesting to talk to.  He has been in town for probably close to 30 years.  He has had pretty decent health.  He actually did have a myocardial infarction recently.  Thankfully, he was able to pull out of this.  He does have a defibrillator in place.  He is followed by Dr. Baird Cancer.  As always, she is very thorough with all of her patients.  She noted that he had anemia and renal insufficiency.  This actually has been ongoing for a while.  She went ahead and ordered lab work.  Surprisingly, an SPEP that was done showed that there was a significant M spike of 3.4 g/dL.  No other cursory myeloma tests were done.  He does have renal insufficiency.  His BUN was 24 and creatinine 2.16 last week.  His total protein has been on the higher side.  His albumin has been a little bit lower.  He has not had any hypercalcemia.  He has been a little bit anemic.  I suspect that he probably has a low erythropoietin level.  There is history of what sounds like myelodysplasia with his father.  He has had multiple surgeries.  He is up-to-date with all of his screening studies.  He does not smoke.  He has occasional alcoholic drink.  He has had no rashes.  He has had no neuropathy.  He has had no obvious change in bowel or bladder habits.  Overall, I would say his performance status is ECOG 1.     Past Medical History:  Diagnosis Date  . Acute blood loss anemia   . Acute encephalopathy   . Acute hypoxemic respiratory failure ([place])   . Acute idiopathic gout of right ankle   . Acute lumbar back pain   . Acute on chronic systolic heart failure ([place])   . Acute respiratory failure ([place])   . AKI (acute kidney  injury) ([employer])   . Altered mental status   . Aortic aneurysm without rupture ([place]) 02/09/2016  . Aortic root enlargement ([place]) 02/13/2012  . Aortic valve regurgitation, acquired 02/13/2012  . Arrhythmia   . Atrial flutter ([place]) 03/18/2012  . Back pain   . Cardiac arrest ([place]) 02/01/2016  . CHF (congestive heart failure) ([place])   . Chronic anticoagulation 03/04/2013  . Chronic kidney disease    kidney fx studies increased   . Chronic lower back pain   . Chronic renal insufficiency   . Chronic systolic heart failure ([place]) 02/13/2012   Recent diagnosis 4 / 2013, LVEF 25% by Echo 12/2011  03/2013: Echo at [facility] Card[facility] Conclusions: 1. Left ventricular ejection fraction estimated by 2D at 40-45 percent. 2. Mild concentric left ventricular hypertrophy. 3. Mild left atrial enlargement. 4. Moderate aortic valve regurgitation. 5. The aortic root at the sinus(es) of valsalva is moderately dilated 6. Mild mitral valve regurgitation. 7.  . CKD (chronic kidney disease)   . CKD (chronic kidney disease), stage IV ([place]) 08/06/2013   Creatinine 2.4 on 07/04/13   . Claustrophobia   . Colon cancer screening 03/04/2013  . Debility 02/22/2016  . Diabetes mellitus type 2 in nonobese (HCC)   . Diabetes mellitus type 2 in obese ([place])   .  Dysrhythmia    "palpitations"  . Encounter for central line placement   . Exertional dyspnea 01/2012  . 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 Goals of care, counseling/discussion 05/05/2020  . HCAP (healthcare-associated pneumonia)   . Heart murmur   . Hyperlipidemia 02/13/2012  . Hypertension   . Hypothyroidism   . Internal hemorrhoids without mention of complication 7/35/3299  . Labile blood pressure   . Left bundle branch block 02/13/2012  . Leg weakness, bilateral   . Long term (current) use of anticoagulants 08/06/2013   Eliquis therapy   . Long term current use of amiodarone 08/07/2016  . Lower extremity weakness   . Migraine 02/13/12   "opthalmic"  .  Multiple myeloma ([place]) 05/05/2020  . Non-traumatic rhabdomyolysis   . Obesity (BMI 30-39.9) 02/13/2012  . Pain   . Paroxysmal atrial fibrillation (HCC)   . Pneumonia   . Retroperitoneal bleed   . Right ankle pain   . Right knee pain   . Severe aortic regurgitation 02/09/2016  . Special screening for malignant neoplasms, colon 04/11/2013  . Thyroid activity decreased   . Tibial pain   . Varicose vein of leg    right  . Ventricular fibrillation ([place]) 02/09/2016  . Weakness of both lower extremities   :  Past Surgical History:  Procedure Laterality Date  . AORTIC VALVE REPLACEMENT  11/20/2018  . CARDIAC CATHETERIZATION N/A 02/17/2016   Procedure: Left Heart Cath and Coronary Angiography;  Surgeon: Jettie Booze, MD;  Location: [place] [facility];  Service: Cardiovascular;  Laterality: N/A;  . CARDIOVERSION  03/22/2012   Procedure: CARDIOVERSION;  Surgeon: Candee Furbish, MD;  Location: [facility] ENDOSCOPY;  Service: Cardiovascular;  Laterality: N/A;  . CARDIOVERSION  04/19/2012   Procedure: CARDIOVERSION;  Surgeon: Sinclair Grooms, MD;  Location: [place];  Service: Cardiovascular;  Laterality: N/A;  . COLONOSCOPY N/A 04/11/2013   Procedure: COLONOSCOPY;  Surgeon: Inda Castle, MD;  Location: [facility] ENDOSCOPY;  Service: Endoscopy;  Laterality: N/A;  . COLONOSCOPY N/A 04/11/2013   Procedure: COLONOSCOPY;  Surgeon: Inda Castle, MD;  Location: [facility] ENDOSCOPY;  Service: Endoscopy;  Laterality: N/A;  . EP IMPLANTABLE DEVICE N/A 02/17/2016   Procedure: BiV ICD Insertion CRT-D;  Surgeon: Evans Lance, MD;  Location: [place] [facility];  Service: Cardiovascular;  Laterality: N/A;  . FINGER SURGERY  2012   "4th digit right hand; thumb on left hand"  . RADIOLOGY WITH ANESTHESIA N/A 02/11/2016   Procedure: MRI OF THE BRAIN WITHOUT CONTRAST, LUMBAR WITHOUT CONTRAST;  Surgeon: Medication Radiologist, MD;  Location: [place];  Service: Radiology;  Laterality: N/A;  DR. WOOD/MRI  . RIGHT/LEFT HEART CATH AND  CORONARY ANGIOGRAPHY N/A 07/26/2018   Procedure: RIGHT/LEFT HEART CATH AND CORONARY ANGIOGRAPHY;  Surgeon: Belva Crome, MD;  Location: [place] [facility];  Service: Cardiovascular;  Laterality: N/A;  . Skin melanocytoma excision  2012   "above left clavicle"  . STERNAL INCISION RECLOSURE  11/2018  . STERNAL WIRE REMOVAL  11/2018  . STERNAL WOUND DEBRIDEMENT  11/2018  . TEE WITHOUT CARDIOVERSION  03/22/2012   Procedure: TRANSESOPHAGEAL ECHOCARDIOGRAM (TEE);  Surgeon: Candee Furbish, MD;  Location: [facility] ENDOSCOPY;  Service: Cardiovascular;  Laterality: N/A;  . TEE WITHOUT CARDIOVERSION N/A 02/08/2016   Procedure: TRANSESOPHAGEAL ECHOCARDIOGRAM (TEE);  Surgeon: Lelon Perla, MD;  Location: [facility] ENDOSCOPY;  Service: Cardiovascular;  Laterality: N/A;  :   Current Outpatient Medications:  .  acetaminophen (TYLENOL) 500 MG  tablet, Take 1,500 mg by mouth every 4 (four) hours as needed for moderate pain or headache. , Disp: , Rfl:  .  amiodarone (PACERONE) 100 MG tablet, Take 1 tablet (100 mg total) by mouth daily., Disp: 90 tablet, Rfl: 3 .  apixaban (ELIQUIS) 5 MG TABS tablet, Take 1 tablet (5 mg total) by mouth 2 (two) times daily., Disp: 60 tablet, Rfl: 5 .  carvedilol (COREG) 6.25 MG tablet, TAKE 1 TABLET(6.25 MG) BY MOUTH TWICE DAILY, Disp: 60 tablet, Rfl: 0 .  docusate sodium (COLACE) 100 MG capsule, Take 100 mg by mouth daily as needed for mild constipation., Disp: , Rfl:  .  fluticasone (FLONASE) 50 MCG/ACT nasal spray, Place 2 sprays into the nose as needed for allergies. , Disp: , Rfl:  .  furosemide (LASIX) 40 MG tablet, Take 1.5 tablets (60 mg total) by mouth daily., Disp: 135 tablet, Rfl: 1 .  Hypromellose (ARTIFICIAL TEARS OP), Place 1 drop into both eyes daily as needed (dry eyes)., Disp: , Rfl:  .  ipratropium (ATROVENT) 0.06 % nasal spray, Place 2 sprays into both nostrils 2 (two) times daily., Disp: 15 mL, Rfl: 3 .  isosorbide-hydrALAZINE (BIDIL) 20-37.5 MG tablet, Take 1 tablet by  mouth 3 (three) times daily., Disp: 270 tablet, Rfl: 3 .  levothyroxine (SYNTHROID) 75 MCG tablet, TAKE 1 TABLET BY MOUTH DAILY IN THE MORNING EXCEPT TAKE 1/2 TABLET ON SUNDAYS IN THE MORNING, Disp: 90 tablet, Rfl: 0 .  potassium chloride 20 MEQ/15ML (10%) SOLN, Take 15 mLs (20 mEq total) by mouth daily., Disp: 473 mL, Rfl: 6 .  rosuvastatin (CRESTOR) 20 MG tablet, Take 1 tablet (20 mg total) by mouth daily., Disp: 90 tablet, Rfl: 2 .  vitamin B-12 (CYANOCOBALAMIN) 100 MCG tablet, Take 100 mcg by mouth daily. , Disp: , Rfl:  .  vitamin C (ASCORBIC ACID) 500 MG tablet, Take 500 mg by mouth as needed. , Disp: , Rfl: :  :  Allergies  Allergen Reactions  . Lactose Intolerance (Gi) Diarrhea  :  Family History  Problem Relation Age of Onset  . Hypertension Mother   . Heart disease Mother   . Heart failure Mother   . Diabetes Mother   . Hypertension Father   . Heart disease Father   . Heart failure Father   :  Social History   Socioeconomic History  . Marital status: Married    Spouse name: [name]  . Number of children: 1  . Years of education: Not on file  . Highest education level: Not on file  Occupational History    Employer: [employer]  Tobacco Use  . Smoking status: Never Smoker  . Smokeless tobacco: Never Used  Vaping Use  . Vaping Use: Never used  Substance and Sexual Activity  . Alcohol use: Yes    Comment: rarely  . Drug use: No  . Sexual activity: Not Currently  Other Topics Concern  . 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   Social Determinants of Health   Financial Resource Strain: Low Risk   . Difficulty of Paying Living Expenses: Not hard at all  Food Insecurity: No Food Insecurity  . Worried About Charity fundraiser in the Last Year: Never true  . Ran Out of Food in the Last Year: Never true  Transportation Needs: No Transportation Needs  . Lack of  Transportation (Medical): No  .  Lack of Transportation (Non-Medical): No  Physical Activity: Inactive  . Days of Exercise per Week: 0 days  . Minutes of Exercise per Session: 0 min  Stress: No Stress Concern Present  . Feeling of Stress : Not at all  Social Connections:   . Frequency of Communication with Friends and Family:   . Frequency of Social Gatherings with Friends and Family:   . Attends Religious Services:   . Active Member of Clubs or Organizations:   . Attends Archivist Meetings:   Marland Kitchen Marital Status:   Intimate Partner Violence:   . Fear of Current or Ex-Partner:   . Emotionally Abused:   Marland Kitchen Physically Abused:   . Sexually Abused:   :  Review of Systems  Constitutional: Negative.   HENT: Negative.   Eyes: Negative.   Respiratory: Negative.   Cardiovascular: Negative.   Gastrointestinal: Negative.   Genitourinary: Negative.   Musculoskeletal: Negative.   Skin: Negative.   Neurological: Negative.   Endo/Heme/Allergies: Negative.   Psychiatric/Behavioral: Negative.      Exam:   This is a well-developed and well-nourished African-American male in no obvious distress.  Vital signs show temperature of 98.1.  Pulse 73.  Blood pressure 161/90.  Weight is 226 pounds.  Head and neck exam shows no ocular or oral lesions.  He has no scleral icterus.  There is no conjunctival inflammation or pallor.  He has no adenopathy in the neck.  Thyroid is nonpalpable.  Lungs are clear to percussion and auscultation bilaterally.  Cardiac exam regular rate and rhythm with a normal S1 and S2.  There are no murmurs, rubs or bruits.  Abdomen is soft.  Is good bowel sounds.  There is no fluid wave.  There is no palpable liver or spleen tip.  Back exam shows no tenderness over the spine, ribs or hips.  Extremities shows no clubbing, cyanosis or edema.  He has decent range of motion of his joints.  Skin exam shows no rashes, ecchymoses or petechia.  Neurological exam shows no focal  neurological deficits.   _0 @   Recent Labs    05/05/20 1028  WBC 5.1  HGB 10.4*  HCT 32.0*  PLT 208   Recent Labs    05/05/20 1028  NA 134*  K 3.4*  CL 104  CO2 25  GLUCOSE 150*  BUN 27*  CREATININE 2.15*  CALCIUM 9.5    Blood smear review: Normochromic and normocytic population of red blood cells.  There are no nucleated red blood cells.  I see no rouleaux formation.  Has no schistocytes or spherocytes.  White blood cells were normal in morphology maturation.  There is no hypersegmented polys.  There is no immature myeloid or lymphoid cells.  I do not see any plasma cells.  Platelets are adequate in number and size.  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  I suspect that he probably does have myeloma.  I would think with a M spike of 3.4 g/dL, not much else could really cause this.  We will be interesting to see what his actual immunoglobulins and light chain studies are.  The renal insufficiency could certainly be from longstanding hypertension.  His anemia could also be from low erythropoietin level.  Ultimately, he is going to need a bone marrow biopsy.  This will clearly show [place] if there is myeloma.  More  importantly, we can get cytogenetics from the bone marrow biopsy to see if there are an abnormal chromosome complement with his plasma cells.  I will also set up a bone survey on him.  Depending on what the bone survey shows, then we might get a PET scan looking for active bone lesions.  Obviously, [name] is well aware of myeloma and the various aspects of this.  He is in pretty good shape.  He is still fairly young.  As such, I think that a autologous stem cell transplant would not be out of the realm of possibility.  Again, we really need to await the results of our lab work.  I also make sure that he get a 24-hour urine for  immunofixation.  I spent a good 60 minutes with him.  He again, is very well versed in what we were talking about.  Once we get all the results back from our studies, then we will plan to get him in for follow-up and for recommendations as far as treatment goes.

## 2020-05-05 NOTE — Progress Notes (Signed)
Initial RN Navigator Patient Visit  Name: Eric Corning., MD Date of Referral : 04/24/2020 Diagnosis: r/o Multiple Myeloma  Met with patient prior to their visit with MD. Hanley Seamen patient "Your Patient Navigator" handout which explains my role, areas in which I am able to help, and all the contact information for myself and the office. Also gave patient MD and Navigator business card. Reviewed with patient the general overview of expected course after initial diagnosis and time frame for all steps to be completed.  New patient packet given to patient which includes: orientation to office and staff; campus directory; education on My Chart and Advance Directives; and patient centered education on Multiple Myeloma  Patient has biopsy scheduled for next week on a nodule in his mouth. Notified Dr Marin Olp.  Patient completed visit with Dr. Marin Olp  After MD visit. Patient will need  Bone Marrow Biopsy  Bone Survey   Awaiting Dr Marin Olp to place orders and will follow up with PA and scheduling.   Patient understands all follow up procedures and expectations. They have my number to reach out for any further clarification or additional needs.

## 2020-05-06 ENCOUNTER — Telehealth: Payer: Self-pay | Admitting: Hematology & Oncology

## 2020-05-06 LAB — IGG, IGA, IGM
IgA: 17 mg/dL — ABNORMAL LOW (ref 61–437)
IgG (Immunoglobin G), Serum: 4640 mg/dL — ABNORMAL HIGH (ref 603–1613)
IgM (Immunoglobulin M), Srm: 13 mg/dL — ABNORMAL LOW (ref 20–172)

## 2020-05-06 LAB — KAPPA/LAMBDA LIGHT CHAINS
Kappa free light chain: 126.6 mg/L — ABNORMAL HIGH (ref 3.3–19.4)
Kappa, lambda light chain ratio: 16.88 — ABNORMAL HIGH (ref 0.26–1.65)
Lambda free light chains: 7.5 mg/L (ref 5.7–26.3)

## 2020-05-06 LAB — ERYTHROPOIETIN: Erythropoietin: 44.1 m[IU]/mL — ABNORMAL HIGH (ref 2.6–18.5)

## 2020-05-06 LAB — BETA 2 MICROGLOBULIN, SERUM: Beta-2 Microglobulin: 17.5 mg/L — ABNORMAL HIGH (ref 0.6–2.4)

## 2020-05-06 NOTE — Telephone Encounter (Signed)
No los 8/11 los

## 2020-05-07 ENCOUNTER — Inpatient Hospital Stay: Payer: Medicare Other

## 2020-05-07 ENCOUNTER — Ambulatory Visit (HOSPITAL_BASED_OUTPATIENT_CLINIC_OR_DEPARTMENT_OTHER)
Admission: RE | Admit: 2020-05-07 | Discharge: 2020-05-07 | Disposition: A | Discharge status: Home / Self Care | Payer: Medicare Other | Source: Ambulatory Visit | Attending: Hematology & Oncology | Admitting: Hematology & Oncology

## 2020-05-07 ENCOUNTER — Other Ambulatory Visit: Payer: Self-pay

## 2020-05-07 ENCOUNTER — Encounter: Payer: Self-pay | Admitting: *Deleted

## 2020-05-07 DIAGNOSIS — C9000 Multiple myeloma not having achieved remission: Secondary | ICD-10-CM | POA: Diagnosis not present

## 2020-05-07 LAB — PROTEIN ELECTROPHORESIS, SERUM, WITH REFLEX
A/G Ratio: 0.6 — ABNORMAL LOW (ref 0.7–1.7)
Albumin ELP: 3.6 g/dL (ref 2.9–4.4)
Alpha-1-Globulin: 0.3 g/dL (ref 0.0–0.4)
Alpha-2-Globulin: 0.6 g/dL (ref 0.4–1.0)
Beta Globulin: 1.1 g/dL (ref 0.7–1.3)
Gamma Globulin: 3.8 g/dL — ABNORMAL HIGH (ref 0.4–1.8)
Globulin, Total: 5.8 g/dL — ABNORMAL HIGH (ref 2.2–3.9)
M-Spike, %: 3.7 g/dL — ABNORMAL HIGH
SPEP Interpretation: 0
Total Protein ELP: 9.4 g/dL — ABNORMAL HIGH (ref 6.0–8.5)

## 2020-05-07 LAB — IMMUNOFIXATION REFLEX, SERUM
IgA: 20 mg/dL — ABNORMAL LOW (ref 61–437)
IgG (Immunoglobin G), Serum: 4941 mg/dL — ABNORMAL HIGH (ref 603–1613)
IgM (Immunoglobulin M), Srm: 7 mg/dL — ABNORMAL LOW (ref 20–172)

## 2020-05-07 NOTE — Progress Notes (Signed)
Patient has been scheduled for bone marrow biopsy 05/21/20. He will bring his 24 hour urine specimen in today around 1pm and requests that he have his bone survey at that time.  Spoke to [name] with radiology and she confirms that patient can have survey today after he drops off specimen.   Patient notified to go to radiology after dropping off his urine specimen today and he can have survey completed. Explained that we will reach out next week, depending on results, on whether or not we need to follow up with PET. 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 [facility]   Oncology Nurse Navigator Documentation  Oncology Nurse Navigator Flowsheets 05/07/2020  Abnormal Finding Date -  Confirmed Diagnosis Date 05/05/2020  Diagnosis Status Additional Work Up  Navigator Follow Up Date: 05/10/2020  Navigator Follow Up Reason: Radiology  Navigator Location [facility]  Referral Date to RadOnc/MedOnc -  Navigator Encounter Type Telephone;Appt/Treatment Plan Review;Education;Diagnostic Results  Telephone Incoming Call;Outgoing Call  Patient Visit Type MedOnc  Treatment Phase Pre-Tx/Tx Discussion  Barriers/Navigation Needs Coordination of Care;Education;Employed  Education Other  Interventions Coordination of Care;Education;Psycho-Social Support  Acuity Level 2-Minimal Needs (1-2 Barriers Identified)  Coordination of Care Appts;Radiology  Education Method Verbal  Support Groups/Services Friends and Family  Time Spent with Patient 6

## 2020-05-10 ENCOUNTER — Encounter: Payer: Self-pay | Admitting: *Deleted

## 2020-05-10 LAB — UIFE/LIGHT CHAINS/TP QN, 24-HR UR
FR KAPPA LT CH,24HR: 503.01 mg/24 hr
FR LAMBDA LT CH,24HR: 8.2 mg/24 hr
Free Kappa Lt Chains,Ur: 386.93 mg/L — ABNORMAL HIGH (ref 0.63–113.79)
Free Kappa/Lambda Ratio: 61.32 — ABNORMAL HIGH (ref 1.03–31.76)
Free Lambda Lt Chains,Ur: 6.31 mg/L (ref 0.47–11.77)
Total Protein, Urine-Ur/day: 2040 mg/24 hr — ABNORMAL HIGH (ref 30–150)
Total Protein, Urine: 156.9 mg/dL
Total Volume: 1300

## 2020-05-10 NOTE — Progress Notes (Signed)
Reviewed bone survey results with Dr Marin Olp. We will not need to proceed with PET.  Called and spoke with patient giving him the following results:  Eric [name], [name], MD  P Onc Nurse Hp Call - the bone survey looks good -- no obvious myeloma in the bones!! [name]   Also reviewed with the patient that he will not need a PET scan. Once patient has had bone marrow and Dr Marin Olp has results we will schedule patient for follow up and discuss treatment plan.   Oncology Nurse Navigator Documentation  Oncology Nurse Navigator Flowsheets 05/10/2020  Abnormal Finding Date -  Confirmed Diagnosis Date -  Diagnosis Status -  Navigator Follow Up Date: 05/21/2020  Navigator Follow Up Reason: Surgery  Navigator Location [facility]  Referral Date to RadOnc/MedOnc -  Navigator Encounter Type Telephone;Diagnostic Results  Telephone Outgoing Call;Diagnostic Results  Patient Visit Type MedOnc  Treatment Phase Pre-Tx/Tx Discussion  Barriers/Navigation Needs Coordination of Care;Education;Employed  Education Other  Interventions Education;Psycho-Social Support  Acuity Level 2-Minimal Needs (1-2 Barriers Identified)  Coordination of Care -  Education Method Verbal  Support Groups/Services Friends and Family  Time Spent with Patient 30

## 2020-05-12 ENCOUNTER — Ambulatory Visit (HOSPITAL_COMMUNITY): Payer: Medicare Other

## 2020-05-20 ENCOUNTER — Ambulatory Visit (HOSPITAL_COMMUNITY): Payer: Medicare Other

## 2020-05-20 ENCOUNTER — Other Ambulatory Visit: Payer: Self-pay | Admitting: Radiology

## 2020-05-21 ENCOUNTER — Other Ambulatory Visit: Payer: Self-pay

## 2020-05-21 ENCOUNTER — Ambulatory Visit (HOSPITAL_COMMUNITY)
Admission: RE | Admit: 2020-05-21 | Discharge: 2020-05-21 | Disposition: A | Discharge status: Home / Self Care | Payer: Medicare Other | Source: Ambulatory Visit | Attending: Hematology & Oncology | Admitting: Hematology & Oncology

## 2020-05-21 ENCOUNTER — Encounter (HOSPITAL_COMMUNITY): Payer: Self-pay

## 2020-05-21 DIAGNOSIS — Z95810 Presence of automatic (implantable) cardiac defibrillator: Secondary | ICD-10-CM | POA: Diagnosis not present

## 2020-05-21 DIAGNOSIS — I2510 Atherosclerotic heart disease of native coronary artery without angina pectoris: Secondary | ICD-10-CM | POA: Insufficient documentation

## 2020-05-21 DIAGNOSIS — N184 Chronic kidney disease, stage 4 (severe): Secondary | ICD-10-CM | POA: Insufficient documentation

## 2020-05-21 DIAGNOSIS — I130 Hypertensive heart and chronic kidney disease with heart failure and stage 1 through stage 4 chronic kidney disease, or unspecified chronic kidney disease: Secondary | ICD-10-CM | POA: Diagnosis not present

## 2020-05-21 DIAGNOSIS — E785 Hyperlipidemia, unspecified: Secondary | ICD-10-CM | POA: Insufficient documentation

## 2020-05-21 DIAGNOSIS — I252 Old myocardial infarction: Secondary | ICD-10-CM | POA: Insufficient documentation

## 2020-05-21 DIAGNOSIS — C9000 Multiple myeloma not having achieved remission: Secondary | ICD-10-CM

## 2020-05-21 DIAGNOSIS — Z79899 Other long term (current) drug therapy: Secondary | ICD-10-CM | POA: Insufficient documentation

## 2020-05-21 DIAGNOSIS — I509 Heart failure, unspecified: Secondary | ICD-10-CM | POA: Insufficient documentation

## 2020-05-21 DIAGNOSIS — E039 Hypothyroidism, unspecified: Secondary | ICD-10-CM | POA: Insufficient documentation

## 2020-05-21 DIAGNOSIS — Z6833 Body mass index (BMI) 33.0-33.9, adult: Secondary | ICD-10-CM | POA: Diagnosis not present

## 2020-05-21 DIAGNOSIS — E1122 Type 2 diabetes mellitus with diabetic chronic kidney disease: Secondary | ICD-10-CM | POA: Insufficient documentation

## 2020-05-21 DIAGNOSIS — E669 Obesity, unspecified: Secondary | ICD-10-CM | POA: Diagnosis not present

## 2020-05-21 DIAGNOSIS — I340 Nonrheumatic mitral (valve) insufficiency: Secondary | ICD-10-CM | POA: Diagnosis not present

## 2020-05-21 DIAGNOSIS — Z7901 Long term (current) use of anticoagulants: Secondary | ICD-10-CM | POA: Insufficient documentation

## 2020-05-21 DIAGNOSIS — Z952 Presence of prosthetic heart valve: Secondary | ICD-10-CM | POA: Insufficient documentation

## 2020-05-21 DIAGNOSIS — Z79890 Hormone replacement therapy (postmenopausal): Secondary | ICD-10-CM | POA: Insufficient documentation

## 2020-05-21 DIAGNOSIS — D649 Anemia, unspecified: Secondary | ICD-10-CM | POA: Insufficient documentation

## 2020-05-21 LAB — CBC WITH DIFFERENTIAL/PLATELET
Abs Immature Granulocytes: 0.01 10*3/uL (ref 0.00–0.07)
Basophils Absolute: 0.1 10*3/uL (ref 0.0–0.1)
Basophils Relative: 1 %
Eosinophils Absolute: 0.3 10*3/uL (ref 0.0–0.5)
Eosinophils Relative: 6 %
HCT: 33.6 % — ABNORMAL LOW (ref 39.0–52.0)
Hemoglobin: 10.8 g/dL — ABNORMAL LOW (ref 13.0–17.0)
Immature Granulocytes: 0 %
Lymphocytes Relative: 21 %
Lymphs Abs: 1.1 10*3/uL (ref 0.7–4.0)
MCH: 29.4 pg (ref 26.0–34.0)
MCHC: 32.1 g/dL (ref 30.0–36.0)
MCV: 91.6 fL (ref 80.0–100.0)
Monocytes Absolute: 0.7 10*3/uL (ref 0.1–1.0)
Monocytes Relative: 13 %
Neutro Abs: 3 10*3/uL (ref 1.7–7.7)
Neutrophils Relative %: 59 %
Platelets: 215 10*3/uL (ref 150–400)
RBC: 3.67 MIL/uL — ABNORMAL LOW (ref 4.22–5.81)
RDW: 17.3 % — ABNORMAL HIGH (ref 11.5–15.5)
WBC: 5.2 10*3/uL (ref 4.0–10.5)
nRBC: 0 % (ref 0.0–0.2)

## 2020-05-21 LAB — PROTIME-INR
INR: 1.4 — ABNORMAL HIGH (ref 0.8–1.2)
Prothrombin Time: 16.8 seconds — ABNORMAL HIGH (ref 11.4–15.2)

## 2020-05-21 LAB — BASIC METABOLIC PANEL
Anion gap: 9 (ref 5–15)
BUN: 34 mg/dL — ABNORMAL HIGH (ref 8–23)
CO2: 22 mmol/L (ref 22–32)
Calcium: 8.9 mg/dL (ref 8.9–10.3)
Chloride: 106 mmol/L (ref 98–111)
Creatinine, Ser: 2.55 mg/dL — ABNORMAL HIGH (ref 0.61–1.24)
GFR calc Af Amer: 29 mL/min — ABNORMAL LOW (ref >60)
GFR calc non Af Amer: 25 mL/min — ABNORMAL LOW (ref >60)
Glucose, Bld: 107 mg/dL — ABNORMAL HIGH (ref 70–99)
Potassium: 4 mmol/L (ref 3.5–5.1)
Sodium: 137 mmol/L (ref 135–145)

## 2020-05-21 MED ORDER — SODIUM CHLORIDE 0.9 % IV SOLN: INTRAVENOUS | Status: DC

## 2020-05-21 MED ORDER — LIDOCAINE HCL (PF) 1 % IJ SOLN
INTRAMUSCULAR | Status: AC | PRN
  Administered 2020-05-21: 10 mL

## 2020-05-21 MED ORDER — MIDAZOLAM HCL 2 MG/2ML IJ SOLN
INTRAMUSCULAR | Status: AC | PRN
  Administered 2020-05-21 (×2): 1 mg via INTRAVENOUS

## 2020-05-21 MED ORDER — FENTANYL CITRATE (PF) 100 MCG/2ML IJ SOLN
INTRAMUSCULAR | Status: AC
  Filled 2020-05-21: qty 2

## 2020-05-21 MED ORDER — FENTANYL CITRATE (PF) 100 MCG/2ML IJ SOLN
INTRAMUSCULAR | Status: AC | PRN
  Administered 2020-05-21 (×2): 50 ug via INTRAVENOUS

## 2020-05-21 MED ORDER — MIDAZOLAM HCL 2 MG/2ML IJ SOLN
INTRAMUSCULAR | Status: AC
  Filled 2020-05-21: qty 4

## 2020-05-21 NOTE — Procedures (Signed)
Interventional Radiology Procedure Note  Procedure: CT guided aspirate and core biopsy of right posterior iliac bone Complications: None Recommendations: - Bedrest supine x 1 hrs - OTC's PRN  Pain - Follow biopsy results  Signed,  Marlaine Arey S. Huyen Perazzo, DO    

## 2020-05-21 NOTE — Progress Notes (Signed)
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

## 2020-05-21 NOTE — Patient Outreach (Signed)
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

## 2020-05-21 NOTE — Discharge Instructions (Signed)
Bone Marrow Aspiration and Bone Marrow Biopsy, Adult, Care After This sheet gives you information about how to care for yourself after your procedure. Your health care provider may also give you more specific instructions. If you have problems or questions, contact your health care provider. What can I expect after the procedure? After the procedure, it is common to have:  Mild pain and tenderness.  Swelling.  Bruising. Follow these instructions at home: Puncture site care   Follow instructions from your health care provider about how to take care of the puncture site. Make sure you: ? Wash your hands with soap and water before and after you change your bandage (dressing). If soap and water are not available, use hand sanitizer. ? Change your dressing as told by your health care provider.  Check your puncture site every day for signs of infection. Check for: ? More redness, swelling, or pain. ? Fluid or blood. ? Warmth. ? Pus or a bad smell. Activity  Return to your normal activities as told by your health care provider. Ask your health care provider what activities are safe for you.  Do not lift anything that is heavier than 10 lb (4.5 kg), or the limit that you are told, until your health care provider says that it is safe.  Do not drive for 24 hours if you were given a sedative during your procedure. General instructions   Take over-the-counter and prescription medicines only as told by your health care provider.  Do not take baths, swim, or use a hot tub until your health care provider approves. Ask your health care provider if you may take showers. You may only be allowed to take sponge baths.  If directed, put ice on the affected area. To do this: ? Put ice in a plastic bag. ? Place a towel between your skin and the bag. ? Leave the ice on for 20 minutes, 2-3 times a day.  Keep all follow-up visits as told by your health care provider. This is important. Contact a  health care provider if:  Your pain is not controlled with medicine.  You have a fever.  You have more redness, swelling, or pain around the puncture site.  You have fluid or blood coming from the puncture site.  Your puncture site feels warm to the touch.  You have pus or a bad smell coming from the puncture site. Summary  After the procedure, it is common to have mild pain, tenderness, swelling, and bruising.  Follow instructions from your health care provider about how to take care of the puncture site and what activities are safe for you.  Take over-the-counter and prescription medicines only as told by your health care provider.  Contact a health care provider if you have any signs of infection, such as fluid or blood coming from the puncture site. This information is not intended to replace advice given to you by your health care provider. Make sure you discuss any questions you have with your health care provider. Document Revised: 01/28/2019 Document Reviewed: 01/28/2019 Elsevier Patient Education  [place].    Moderate Conscious Sedation, Adult, Care After These instructions provide you with information about caring for yourself after your procedure. Your health care provider may also give you more specific instructions. Your treatment has been planned according to current medical practices, but problems sometimes occur. Call your health care provider if you have any problems or questions after your procedure. What can I expect after the procedure?  After your procedure, it is common:  To feel sleepy for several hours.  To feel clumsy and have poor balance for several hours.  To have poor judgment for several hours.  To vomit if you eat too soon. Follow these instructions at home: For at least 24 hours after the procedure:   Do not: ? Participate in activities where you could fall or become injured. ? Drive. ? Use heavy machinery. ? Drink  alcohol. ? Take sleeping pills or medicines that cause drowsiness. ? Make important decisions or sign legal documents. ? Take care of children on your own.  Rest. Eating and drinking  Follow the diet recommended by your health care provider.  If you vomit: ? Drink water, juice, or soup when you can drink without vomiting. ? Make sure you have little or no nausea before eating solid foods. General instructions  Have a responsible adult stay with you until you are awake and alert.  Take over-the-counter and prescription medicines only as told by your health care provider.  If you smoke, do not smoke without supervision.  Keep all follow-up visits as told by your health care provider. This is important. Contact a health care provider if:  You keep feeling nauseous or you keep vomiting.  You feel light-headed.  You develop a rash.  You have a fever. Get help right away if:  You have trouble breathing. This information is not intended to replace advice given to you by your health care provider. Make sure you discuss any questions you have with your health care provider. Document Revised: 08/24/2017 Document Reviewed: 01/01/2016 Elsevier Patient Education  2020 [employer].

## 2020-05-21 NOTE — Consult Note (Signed)
Chief Complaint: Patient was seen in consultation today for CT-guided bone marrow biopsy  Referring Physician(s): Ennever,Peter R  Supervising Physician: Corrie Mckusick  Patient Status: [facility] - Out-pt  History of Present Illness: Eric Corning., Eric [name] is a 69 y.o. male with history of CHF, diabetes, coronary artery disease/prior MI, prior aortic valve replacement, ICD placement, anemia, renal insufficiency and abnormal SPEP with M spike as well as elevated free kappa light chains and elevated kappa lambda ratio suspicious for multiple myeloma.  He presents today for CT-guided bone marrow biopsy for further evaluation.  Additional medical history as below.  Past Medical History:  Diagnosis Date  . Acute blood loss anemia   . Acute encephalopathy   . Acute hypoxemic respiratory failure ([place])   . Acute idiopathic gout of right ankle   . Acute lumbar back pain   . Acute on chronic systolic heart failure ([place])   . Acute respiratory failure ([place])   . AKI (acute kidney injury) ([place])   . Altered mental status   . Aortic aneurysm without rupture ([place]) 02/09/2016  . Aortic root enlargement ([place]) 02/13/2012  . Aortic valve regurgitation, acquired 02/13/2012  . Arrhythmia   . Atrial flutter ([place]) 03/18/2012  . Back pain   . Cardiac arrest ([place]) 02/01/2016  . CHF (congestive heart failure) ([place])   . Chronic anticoagulation 03/04/2013  . Chronic kidney disease    kidney fx studies increased   . Chronic lower back pain   . Chronic renal insufficiency   . Chronic systolic heart failure ([place]) 02/13/2012   Recent diagnosis 4 / 2013, LVEF 25% by Echo 12/2011  03/2013: Echo at [facility] Cardiology Conclusions: 1. Left ventricular ejection fraction estimated by 2D at 40-45 percent. 2. Mild concentric left ventricular hypertrophy. 3. Mild left atrial enlargement. 4. Moderate aortic valve regurgitation. 5. The aortic root at the sinus(es) of valsalva is moderately dilated 6. Mild mitral valve regurgitation. 7.    . CKD (chronic kidney disease)   . CKD (chronic kidney disease), stage IV ([place]) 08/06/2013   Creatinine 2.4 on 07/04/13   . Claustrophobia   . Colon cancer screening 03/04/2013  . Debility 02/22/2016  . Diabetes mellitus type 2 in nonobese (HCC)   . Diabetes mellitus type 2 in obese ([place])   . Dysrhythmia    "palpitations"  . Encounter for central line placement   . Exertional dyspnea 01/2012  . 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 Goals of care, counseling/discussion 05/05/2020  . HCAP (healthcare-associated pneumonia)   . Heart murmur   . Hyperlipidemia 02/13/2012  . Hypertension   . Hypothyroidism   . Internal hemorrhoids without mention of complication 6/62/9476  . Labile blood pressure   . Left bundle branch block 02/13/2012  . Leg weakness, bilateral   . Long term (current) use of anticoagulants 08/06/2013   Eliquis therapy   . Long term current use of amiodarone 08/07/2016  . Lower extremity weakness   . Migraine 02/13/12   "opthalmic"  . Multiple myeloma ([place]) 05/05/2020  . Non-traumatic rhabdomyolysis   . Obesity (BMI 30-39.9) 02/13/2012  . Pain   . Paroxysmal atrial fibrillation (HCC)   . Pneumonia   . Retroperitoneal bleed   . Right ankle pain   . Right knee pain   . Severe aortic regurgitation 02/09/2016  . Special screening for malignant neoplasms, colon 04/11/2013  . Thyroid activity decreased   . Tibial pain   . Varicose vein of leg  right  . Ventricular fibrillation ([place]) 02/09/2016  . Weakness of both lower extremities     Past Surgical History:  Procedure Laterality Date  . AORTIC VALVE REPLACEMENT  11/20/2018  . CARDIAC CATHETERIZATION N/A 02/17/2016   Procedure: Left Heart Cath and Coronary Angiography;  Surgeon: Jettie Booze, Eric [name];  Location: [facility] CV LAB;  Service: Cardiovascular;  Laterality: N/A;  . CARDIOVERSION  03/22/2012   Procedure: CARDIOVERSION;  Surgeon: Candee Furbish, Eric [name];  Location[facility] ENDOSCOPY;  Service:  Cardiovascular;  Laterality: N/A;  . CARDIOVERSION  04/19/2012   Procedure: CARDIOVERSION;  Surgeon: Sinclair Grooms, Eric [name];  Location: [place];  Service: Cardiovascular;  Laterality: N/A;  . COLONOSCOPY N/A 04/11/2013   Procedure: COLONOSCOPY;  Surgeon: Inda Castle, Eric [name];  Location: [facility] ENDOSCOPY;  Service: Endoscopy;  Laterality: N/A;  . COLONOSCOPY N/A 04/11/2013   Procedure: COLONOSCOPY;  Surgeon: Inda Castle, Eric [name];  Location: [facility] ENDOSCOPY;  Service: Endoscopy;  Laterality: N/A;  . EP IMPLANTABLE DEVICE N/A 02/17/2016   Procedure: BiV ICD Insertion CRT-D;  Surgeon: Evans Lance, Eric [name];  Location: [place] CV LAB;  Service: Cardiovascular;  Laterality: N/A;  . FINGER SURGERY  2012   "4th digit right hand; thumb on left hand"  . RADIOLOGY WITH ANESTHESIA N/A 02/11/2016   Procedure: MRI OF THE BRAIN WITHOUT CONTRAST, LUMBAR WITHOUT CONTRAST;  Surgeon: Medication Radiologist, Eric [name];  Location: [place];  Service: Radiology;  Laterality: N/A;  DR. WOOD/MRI  . RIGHT/LEFT HEART CATH AND CORONARY ANGIOGRAPHY N/A 07/26/2018   Procedure: RIGHT/LEFT HEART CATH AND CORONARY ANGIOGRAPHY;  Surgeon: Belva Crome, Eric [name];  Location: [place] CV LAB;  Service: Cardiovascular;  Laterality: N/A;  . Skin melanocytoma excision  2012   "above left clavicle"  . STERNAL INCISION RECLOSURE  11/2018  . STERNAL WIRE REMOVAL  11/2018  . STERNAL WOUND DEBRIDEMENT  11/2018  . TEE WITHOUT CARDIOVERSION  03/22/2012   Procedure: TRANSESOPHAGEAL ECHOCARDIOGRAM (TEE);  Surgeon: Candee Furbish, Eric [name];  Location: [facility] ENDOSCOPY;  Service: Cardiovascular;  Laterality: N/A;  . TEE WITHOUT CARDIOVERSION N/A 02/08/2016   Procedure: TRANSESOPHAGEAL ECHOCARDIOGRAM (TEE);  Surgeon: Lelon Perla, Eric [name];  Location: [facility] ENDOSCOPY;  Service: Cardiovascular;  Laterality: N/A;    Allergies: Lactose intolerance (gi)  Medications: Prior to Admission medications   Medication Sig Start Date End Date Taking? Authorizing Provider  amiodarone (PACERONE)  100 MG tablet Take 1 tablet (100 mg total) by mouth daily. 06/03/19  Yes Evans Lance, Eric [name]  apixaban (ELIQUIS) 5 MG TABS tablet Take 1 tablet (5 mg total) by mouth 2 (two) times daily. 04/22/20  Yes [place] Chard, Eric [name]  carvedilol (COREG) 6.25 MG tablet TAKE 1 TABLET(6.25 MG) BY MOUTH TWICE DAILY 04/22/20  Yes [place] Chard, Eric [name]  fluticasone [facility]) 50 MCG/ACT nasal spray Place 2 sprays into the nose as needed for allergies.    Yes Provider, Historical, Eric [name]  furosemide (LASIX) 40 MG tablet Take 1.5 tablets (60 mg total) by mouth daily. 04/22/20  Yes [place] Chard, Eric [name]  Hypromellose (ARTIFICIAL TEARS OP) Place 1 drop into both eyes daily as needed (dry eyes).   Yes Provider, Historical, Eric [name]  isosorbide-hydrALAZINE (BIDIL) 20-37.5 MG tablet Take 1 tablet by mouth 3 (three) times daily. 04/22/20  Yes [place] Chard, Eric [name]  levothyroxine (SYNTHROID) 75 MCG tablet TAKE 1 TABLET BY MOUTH DAILY IN THE MORNING EXCEPT TAKE 1/2 TABLET ON SUNDAYS IN THE MORNING 01/09/20  Yes [place] Chard, Eric [name]  rosuvastatin (CRESTOR) 20 MG tablet Take 1 tablet (20 mg total)  by mouth daily. 04/22/20  Yes [place] Chard, Eric [name]  vitamin B-12 (CYANOCOBALAMIN) 100 MCG tablet Take 100 mcg by mouth daily.    Yes Provider, Historical, Eric [name]  vitamin C (ASCORBIC ACID) 500 MG tablet Take 500 mg by mouth as needed.    Yes Provider, Historical, Eric [name]  acetaminophen (TYLENOL) 500 MG tablet Take 1,500 mg by mouth every 4 (four) hours as needed for moderate pain or headache.     Provider, Historical, Eric [name]  docusate sodium (COLACE) 100 MG capsule Take 100 mg by mouth daily as needed for mild constipation.    Provider, Historical, Eric [name]  ipratropium (ATROVENT) 0.06 % nasal spray Place 2 sprays into both nostrils 2 (two) times daily. 05/13/19   [place] Chard, Eric [name]  potassium chloride 20 MEQ/15ML (10%) SOLN Take 15 mLs (20 mEq total) by mouth daily. 03/07/19   Belva Crome, Eric [name]     Family History  Problem Relation Age of Onset  . Hypertension Mother   . Heart disease  Mother   . Heart failure Mother   . Diabetes Mother   . Hypertension Father   . Heart disease Father   . Heart failure Father     Social History   Socioeconomic History  . Marital status: Married    Spouse name: [name]  . Number of children: 1  . Years of education: Not on file  . Highest education level: Not on file  Occupational History    Employer: [employer]  Tobacco Use  . Smoking status: Never Smoker  . Smokeless tobacco: Never Used  Vaping Use  . Vaping Use: Never used  Substance and Sexual Activity  . Alcohol use: Yes    Comment: rarely  . Drug use: No  . Sexual activity: Not Currently  Other Topics Concern  . 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   Social Determinants of Health   Financial Resource Strain: Low Risk   . Difficulty of Paying Living Expenses: Not hard at all  Food Insecurity: No Food Insecurity  . Worried About Charity fundraiser in the Last Year: Never true  . Ran Out of Food in the Last Year: Never true  Transportation Needs: No Transportation Needs  . Lack of Transportation (Medical): No  . Lack of Transportation (Non-Medical): No  Physical Activity: Inactive  . Days of Exercise per Week: 0 days  . Minutes of Exercise per Session: 0 min  Stress: No Stress Concern Present  . Feeling of Stress : Not at all  Social Connections:   . Frequency of Communication with Friends and Family: Not on file  . Frequency of Social Gatherings with Friends and Family: Not on file  . Attends Religious Services: Not on file  . Active Member of Clubs or Organizations: Not on file  . Attends Archivist Meetings: Not on file  . Marital Status: Not on file      Review of Systems currently denies fever, headache, chest pain, dyspnea, cough, abdominal/back pain, nausea, vomiting or bleeding.  Vital Signs: BP (!) 157/113   Pulse 70   Temp 98.5 F (36.9  C) (Oral)   Ht _0  (1.753 m)   Wt 226 lb (102.5 kg)   SpO2 100%   BMI 33.37 kg/m   Physical Exam awake, alert.  Chest clear to auscultation bilaterally.  Left chest wall ICD noted.  Heart with regular rate  and rhythm.  Abdomen soft, positive bowel sounds, nontender.  No significant lower extremity edema.  Imaging: DG Bone Survey Met  Result Date: 05/08/2020 CLINICAL DATA:  New diagnosis of multiple myeloma. EXAM: METASTATIC BONE SURVEY COMPARISON:  None. FINDINGS: Examination demonstrates no focal lytic lesions throughout the axial or appendicular skeleton. Spondylosis throughout the spine with disc space narrowing at the L4-5 level. No compression fracture. Moderate cardiomegaly. IMPRESSION: No focal lytic lesions throughout the axial or appendicular skeleton. Electronically Signed   By: Marin Olp M.D.   On: 05/08/2020 18:01    Labs:  CBC: Recent Labs    09/02/19 1445 04/22/20 1650 05/05/20 1028 05/21/20 0807  WBC 5.7 5.3 5.1 5.2  HGB 9.7* 10.0* 10.4* 10.8*  HCT 30.8* 31.5* 32.0* 33.6*  PLT 276 224 208 215    COAGS: Recent Labs    05/21/20 0807  INR 1.4*    BMP: Recent Labs    09/02/19 1445 04/22/20 1650 05/05/20 1028  NA 137 137 134*  K 4.1 3.7 3.4*  CL 104 104 104  CO2 20 19* 25  GLUCOSE 107* 111* 150*  BUN 26 24 27*  CALCIUM 8.8 8.3* 9.5  CREATININE 1.99* 2.16* 2.15*  GFRNONAA 34* 30* 31*  GFRAA 39* 35* 35*    LIVER FUNCTION TESTS: Recent Labs    09/02/19 1445 04/22/20 1650 05/05/20 1028  BILITOT 0.9 0.6 0.7  AST 69* 40 52*  ALT 56* 27 32  ALKPHOS 82 68 60  PROT 10.0* 9.1* 9.6*  ALBUMIN 3.1* 3.3* 3.1*    TUMOR MARKERS: No results for input(s): AFPTM, CEA, CA199, CHROMGRNA in the last 8760 hours.  Assessment and Plan: 69 y.o. male with history of CHF, diabetes, coronary artery disease/prior MI, prior aortic valve replacement, ICD placement, anemia, renal insufficiency and abnormal SPEP with M spike as well as elevated free kappa light  chains and elevated kappa lambda ratio suspicious for multiple myeloma.  He presents today for CT-guided bone marrow biopsy for further evaluation. Risks and benefits of procedure was discussed with the patient  including, but not limited to bleeding, infection, damage to adjacent structures or low yield requiring additional tests.  All of the questions were answered and there is agreement to proceed.  Consent signed and in chart.     Thank you for this interesting consult.  I greatly enjoyed meeting Eric Corning., Eric [name] and look forward to participating in their care.  A copy of this report was sent to the requesting provider on this date.  Electronically Signed: D. Rowe Robert, PA-C 05/21/2020, 8:26 AM   I spent a total of 20 minutes in face to face in clinical consultation, greater than 50% of which was counseling/coordinating care for CT-guided bone marrow biopsy

## 2020-05-25 ENCOUNTER — Encounter: Payer: Self-pay | Admitting: *Deleted

## 2020-05-25 LAB — SURGICAL PATHOLOGY

## 2020-05-25 NOTE — Progress Notes (Signed)
Patient BMbx results have returned. Reviewed with Dr Marin Olp. Dr Marin Olp would like patient to come in this week to discuss all testing results and treatment recommendations. Message sent to schedulers.   Oncology Nurse Navigator Documentation  Oncology Nurse Navigator Flowsheets 05/25/2020  Abnormal Finding Date -  Confirmed Diagnosis Date -  Diagnosis Status -  Navigator Follow Up Date: 05/26/2020  Navigator Follow Up Reason: Follow-up Appointment  Navigator Location [facility]  Referral Date to RadOnc/MedOnc -  Navigator Encounter Type Pathology Review;Appt/Treatment Plan Review  Telephone -  Patient Visit Type MedOnc  Treatment Phase Pre-Tx/Tx Discussion  Barriers/Navigation Needs Coordination of Care;Education;Employed  Education -  Interventions Coordination of Care  Acuity Level 2-Minimal Needs (1-2 Barriers Identified)  Coordination of Care Appts  Education Method -  Support Groups/Services Friends and Family  Time Spent with Patient 30

## 2020-05-26 ENCOUNTER — Inpatient Hospital Stay: Payer: Medicare Other | Attending: Hematology & Oncology

## 2020-05-26 ENCOUNTER — Encounter: Payer: Self-pay | Admitting: Hematology & Oncology

## 2020-05-26 ENCOUNTER — Other Ambulatory Visit: Payer: Self-pay

## 2020-05-26 ENCOUNTER — Inpatient Hospital Stay (HOSPITAL_BASED_OUTPATIENT_CLINIC_OR_DEPARTMENT_OTHER): Payer: Medicare Other | Admitting: Hematology & Oncology

## 2020-05-26 ENCOUNTER — Encounter: Payer: Self-pay | Admitting: *Deleted

## 2020-05-26 VITALS — BP 137/85 | HR 92 | Temp 98.1°F | Resp 18 | Wt 228.8 lb

## 2020-05-26 DIAGNOSIS — Z79899 Other long term (current) drug therapy: Secondary | ICD-10-CM | POA: Diagnosis not present

## 2020-05-26 DIAGNOSIS — N189 Chronic kidney disease, unspecified: Secondary | ICD-10-CM | POA: Insufficient documentation

## 2020-05-26 DIAGNOSIS — Z5112 Encounter for antineoplastic immunotherapy: Secondary | ICD-10-CM | POA: Diagnosis not present

## 2020-05-26 DIAGNOSIS — C9000 Multiple myeloma not having achieved remission: Secondary | ICD-10-CM | POA: Diagnosis present

## 2020-05-26 DIAGNOSIS — Z5111 Encounter for antineoplastic chemotherapy: Secondary | ICD-10-CM | POA: Insufficient documentation

## 2020-05-26 NOTE — Progress Notes (Signed)
Hematology and Oncology Follow Up Visit  Tammy Ericsson., MD [id number] [birth year] 69 y.o. 05/26/2020   Principle Diagnosis:   IgG kappa myeloma  Current Therapy:    CyBorD -- start cycle #1 on 06/04/2020     Interim History:  Mr. Greenleaf is back for follow-up.  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  We did a evaluation for his myeloma.  We found that he has a IgG kappa myeloma.  His SPEP that we did on him showed an M spike of 3.7 g/dL.  He has a IgG level of 4900 mg/dL.  He has a IgM level of 7 mg/dL.  The IgA level is 20 mg/dL.  He has a kappa light chain.  His kappa light chain in the blood was 13.7 mg/dL.  A 24-hour urine was done.  This showed that he was excreting 500 mg/day of Kappa light chain.  Finally, we did a bone marrow biopsy on him.  This was done on 05/21/2020.  The pathology report ([id number]) showed plasma cell myeloma.  On inspection, there are 35% plasma cells.  On immunohistochemical analysis, he has 60-70% plasma cells.  I do not have back the cytogenetics as of yet.  A bone survey was done.  There is no lytic lesions noted on the bone survey.  I think the real question is whether or not he is a candidate for stem cell transplantation.  I think with his other health issues, stem cell transplantation is going to be questionable.  I think that he would be a very good candidate for systemic therapy.  Again, I do not have back the cytogenetics/FISH   At this point, I think that a good option for him would be the use of Cytoxan/Velcade/Decadron.  He does have chronic renal insufficiency.  As such, using an immunomodulators such as Revlimid might be a little bit tricky.  I really think that using CyBorD would be reasonable.  I think that there should be a significant response rate.  I would think response rates should be close to 90%.  Again, I do not have back the cytogenetics.  If we find that she has adverse chromosomes or FISH, then I might add daratumumab.  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  Again, we have to try to avoid toxicity.  I think that with CyBorD we can minimize toxicity, particular neuropathy.  Currently, his performance status is ECOG 1.  Medications:  Current Outpatient Medications:  .  acetaminophen (TYLENOL) 500 MG tablet, Take 1,500 mg by mouth every 4 (four) hours as needed for moderate pain or headache. , Disp: , Rfl:  .  amiodarone (PACERONE) 100 MG tablet, Take 1 tablet (100 mg total) by mouth daily., Disp: 90 tablet, Rfl: 3 .  apixaban (ELIQUIS) 5 MG TABS tablet, Take 1 tablet (5 mg total) by mouth 2 (two) times daily., Disp: 60 tablet, Rfl: 5 .  carvedilol (COREG) 6.25 MG tablet, TAKE 1 TABLET(6.25 MG) BY MOUTH TWICE DAILY, Disp: 60 tablet, Rfl: 0 .  chlorhexidine (PERIDEX) 0.12 % solution, 15 mLs 2 (two) times daily., Disp: , Rfl:  .  docusate sodium (COLACE) 100 MG capsule, Take 100 mg by mouth daily as needed for mild constipation., Disp: , Rfl:  .  fluticasone (FLONASE) 50 MCG/ACT nasal spray, Place 2 sprays into the nose as needed for allergies. , Disp: , Rfl:  .  furosemide (LASIX) 40 MG tablet, Take 1.5 tablets (  60 mg total) by mouth daily., Disp: 135 tablet, Rfl: 1 .  HYDROcodone-acetaminophen (NORCO/VICODIN) 5-325 MG tablet, Take 1 tablet by mouth every 4 (four) hours as needed., Disp: , Rfl:  .  Hypromellose (ARTIFICIAL TEARS OP), Place 1 drop into both eyes daily as needed (dry eyes)., Disp: , Rfl:  .  ipratropium (ATROVENT) 0.06 % nasal spray, Place 2 sprays into both nostrils 2 (two) times daily., Disp: 15 mL, Rfl: 3 .  isosorbide-hydrALAZINE (BIDIL) 20-37.5 MG tablet, Take 1 tablet by mouth 3 (three) times daily., Disp: 270 tablet, Rfl: 3 .  levothyroxine (SYNTHROID) 75 MCG tablet, TAKE 1 TABLET BY MOUTH DAILY IN THE MORNING EXCEPT TAKE 1/2 TABLET ON SUNDAYS IN THE MORNING, Disp: 90 tablet, Rfl: 0 .  potassium chloride 20 MEQ/15ML (10%) SOLN, Take 15 mLs (20 mEq  total) by mouth daily., Disp: 473 mL, Rfl: 6 .  rosuvastatin (CRESTOR) 20 MG tablet, Take 1 tablet (20 mg total) by mouth daily., Disp: 90 tablet, Rfl: 2 .  vitamin B-12 (CYANOCOBALAMIN) 100 MCG tablet, Take 100 mcg by mouth daily. , Disp: , Rfl:  .  vitamin C (ASCORBIC ACID) 500 MG tablet, Take 500 mg by mouth as needed. , Disp: , Rfl:   Allergies:  Allergies  Allergen Reactions  . Lactose Intolerance (Gi) Diarrhea    Past Medical History, Surgical history, Social history, and Family History were reviewed and updated.  Review of Systems: Review of Systems  Constitutional: Negative.   HENT:  Negative.   Eyes: Negative.   Respiratory: Negative.   Cardiovascular: Negative.   Gastrointestinal: Negative.   Endocrine: Negative.   Genitourinary: Negative.    Musculoskeletal: Negative.   Skin: Negative.   Neurological: Negative.   Hematological: Negative.   Psychiatric/Behavioral: Negative.     Physical Exam:  weight is 228 lb 12 oz (103.8 kg). His oral temperature is 98.1 F (36.7 C). His blood pressure is 137/85 and his pulse is 92. His respiration is 18 and oxygen saturation is 95%.   Wt Readings from Last 3 Encounters:  05/26/20 228 lb 12 oz (103.8 kg)  05/21/20 226 lb (102.5 kg)  05/05/20 226 lb 12.8 oz (102.9 kg)    Physical Exam Vitals reviewed.  HENT:     Head: Normocephalic and atraumatic.  Eyes:     Pupils: Pupils are equal, round, and reactive to light.  Cardiovascular:     Rate and Rhythm: Normal rate and regular rhythm.     Heart sounds: Normal heart sounds.  Pulmonary:     Effort: Pulmonary effort is normal.     Breath sounds: Normal breath sounds.  Abdominal:     General: Bowel sounds are normal.     Palpations: Abdomen is soft.  Musculoskeletal:        General: No tenderness or deformity. Normal range of motion.     Cervical back: Normal range of motion.  Lymphadenopathy:     Cervical: No cervical adenopathy.  Skin:    General: Skin is warm and  dry.     Findings: No erythema or rash.  Neurological:     Mental Status: He is alert and oriented to person, place, and time.  Psychiatric:        Behavior: Behavior normal.        Thought Content: Thought content normal.        Judgment: Judgment normal.      Lab Results  Component Value Date   WBC 5.2 05/21/2020   HGB 10.8 (L)  05/21/2020   HCT 33.6 (L) 05/21/2020   MCV 91.6 05/21/2020   PLT 215 05/21/2020     Chemistry      Component Value Date/Time   NA 137 05/21/2020 0807   NA 137 04/22/2020 1650   K 4.0 05/21/2020 0807   CL 106 05/21/2020 0807   CO2 22 05/21/2020 0807   BUN 34 (H) 05/21/2020 0807   BUN 24 04/22/2020 1650   CREATININE 2.55 (H) 05/21/2020 0807   CREATININE 2.15 (H) 05/05/2020 1028   CREATININE 1.95 (H) 09/08/2016 0859      Component Value Date/Time   CALCIUM 8.9 05/21/2020 0807   ALKPHOS 60 05/05/2020 1028   AST 52 (H) 05/05/2020 1028   ALT 32 05/05/2020 1028   BILITOT 0.7 05/05/2020 1028      Impression and Plan: Mr. Guggenheim is a very nice 69 year old African-American male.  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  He has IgG kappa myeloma.  We will have to see what the cytogenetics are.  Currently, I would put him at a good risk myeloma.  He has a fairly decent IV access.  Has had I do not think we need to have a Port-A-Cath placed.  We will get him set up with the CyBorD regimen.  He will call us about his work schedule.  He likes to be treated on Fridays.  I went over the side effect profile.  Again I really think he should tolerate this nicely.  Our protocol is 3 weeks on and 1 week off.  Again, whether or not he is a candidate for stem cell transplantation is going to be questionable.  We will have to make referral to one of the academic medical centers to have him assessed for transplant eligibility.  I probably would consider doing this as we go down the line with treatment and see how he  responds.  Hopefully, we can get started on September 10.  Again, he will call us to see if this is a good day for him.  I will plan to see him back when we start a second cycle of treatment.  I spent close to an hour with him.   Volanda Napoleon, MD 9/1/20215:08 PM

## 2020-05-26 NOTE — Progress Notes (Signed)
START ON PATHWAY REGIMEN - Multiple Myeloma and Other Plasma Cell Dyscrasias     A cycle is every 21 days:     Dexamethasone      Bortezomib      Cyclophosphamide   **Always confirm dose/schedule in your pharmacy ordering system**  Patient Characteristics: Multiple Myeloma, Newly Diagnosed, Transplant Eligible, Standard Risk Disease Classification: Multiple Myeloma R-ISS Staging: III Therapeutic Status: Newly Diagnosed Is Patient Eligible for Transplant<= Transplant Eligible Risk Status: Standard Risk Intent of Therapy: Non-Curative / Palliative Intent, Discussed with Patient

## 2020-05-27 ENCOUNTER — Telehealth: Payer: Self-pay | Admitting: Hematology & Oncology

## 2020-05-27 ENCOUNTER — Encounter: Payer: Self-pay | Admitting: *Deleted

## 2020-05-27 NOTE — Progress Notes (Signed)
Oncology Nurse Navigator Documentation  Oncology Nurse Navigator Flowsheets 05/26/2020  Abnormal Finding Date -  Confirmed Diagnosis Date -  Diagnosis Status -  Navigator Follow Up Date: 05/27/2020  Navigator Follow Up Reason: Appointment Review  Navigator Location [facility]  Referral Date to RadOnc/MedOnc -  Navigator Encounter Type Treatment;Appt/Treatment Plan Review  Telephone -  Patient Visit Type MedOnc  Treatment Phase Pre-Tx/Tx Discussion  Barriers/Navigation Needs Coordination of Care;Education;Employed  Education -  Interventions Psycho-Social Support  Acuity Level 2-Minimal Needs (1-2 Barriers Identified)  Coordination of Care -  Education Method -  Support Groups/Services Friends and Family  Time Spent with Patient 15

## 2020-05-27 NOTE — Telephone Encounter (Signed)
Appointments scheduled calendar printed & mailed per 9/1 los

## 2020-05-27 NOTE — Progress Notes (Signed)
Called patient to discuss upcoming appointments. Initially we had scheduled treatment to start on 9/10 however due to patient's schedule he would like to start on 06/11/20. Accommodations to the schedule made. Chemo education scheduled. Answered patient questions regarding treatment expectations. He knows to contact the office with any questions or concerns.  Dr Marin Olp updated to new treatment start  Oncology Nurse Navigator Documentation  Oncology Nurse Navigator Flowsheets 05/27/2020  Abnormal Finding Date -  Confirmed Diagnosis Date -  Diagnosis Status -  Planned Course of Treatment Chemotherapy  Phase of Treatment Chemo  Chemotherapy Pending- Reason: Patient Request/Initiated  Chemotherapy Actual Start Date: 06/11/2020  Navigator Follow Up Date: 06/01/2020  Navigator Follow Up Reason: Chemo Class  Navigator Location [facility]  Referral Date to RadOnc/MedOnc -  Navigator Encounter Type Telephone  Telephone Appt Confirmation/Clarification;Education;Outgoing Call  Patient Visit Type MedOnc  Treatment Phase Pre-Tx/Tx Discussion  Barriers/Navigation Needs Coordination of Care;Education;Employed  Education Preparing for Upcoming Surgery/ Treatment  Interventions Coordination of Care;Education;Psycho-Social Support  Acuity Level 2-Minimal Needs (1-2 Barriers Identified)  Coordination of Care Appts  Education Method Verbal  Support Groups/Services Friends and Family  Time Spent with Patient 49

## 2020-05-27 NOTE — Progress Notes (Unsigned)
Pharmacist Chemotherapy Monitoring - Initial Assessment    Anticipated start date: 06/04/20  Regimen:  . Are orders appropriate based on the patient's diagnosis, regimen, and cycle? Yes . Does the plan date match the patient's scheduled date? Yes . Is the sequencing of drugs appropriate? Yes . Are the premedications appropriate for the patient's regimen? Yes . Prior Authorization for treatment is: Not Started o If applicable, is the correct biosimilar selected based on the patient's insurance? not applicable  Organ Function and Labs: Marland Kitchen Are dose adjustments needed based on the patient's renal function, hepatic function, or hematologic function? No . Are appropriate labs ordered prior to the start of patient's treatment? Yes . Other organ system assessment, if indicated: N/A . The following baseline labs, if indicated, have been ordered: N/A  Dose Assessment: . Are the drug doses appropriate? Yes . Are the following correct: o Drug concentrations Yes o IV fluid compatible with drug Yes o Administration routes Yes o Timing of therapy Yes . If applicable, does the patient have documented access for treatment and/or plans for port-a-cath placement? yes . If applicable, have lifetime cumulative doses been properly documented and assessed? not applicable Lifetime Dose Tracking  No doses have been documented on this patient for the following tracked chemicals: Doxorubicin, Epirubicin, Idarubicin, Daunorubicin, Mitoxantrone, Bleomycin, Oxaliplatin, Carboplatin, Liposomal Doxorubicin  o   Toxicity Monitoring/Prevention: . The patient has the following take home antiemetics prescribed: Ondansetron/Compazine/lorazepam . The patient has the following take home medications prescribed: N/A (checking with MD re: VZV proph)  . Medication allergies and previous infusion related reactions, if applicable, have been reviewed and addressed. Yes . The patient's current medication list has been assessed  for drug-drug interactions with their chemotherapy regimen. no significant drug-drug interactions were identified on review.  Order Review: . Are the treatment plan orders signed? Yes . Is the patient scheduled to see a provider prior to their treatment? No  I verify that I have reviewed each item in the above checklist and answered each question accordingly.  [name], [name] 05/27/2020 2:38 PM

## 2020-06-01 ENCOUNTER — Other Ambulatory Visit: Payer: Self-pay

## 2020-06-01 ENCOUNTER — Encounter (HOSPITAL_COMMUNITY): Payer: Self-pay | Admitting: Hematology & Oncology

## 2020-06-01 ENCOUNTER — Inpatient Hospital Stay: Payer: Medicare Other

## 2020-06-01 ENCOUNTER — Encounter: Payer: Self-pay | Admitting: *Deleted

## 2020-06-01 ENCOUNTER — Other Ambulatory Visit: Payer: Self-pay | Admitting: *Deleted

## 2020-06-01 DIAGNOSIS — C9000 Multiple myeloma not having achieved remission: Secondary | ICD-10-CM

## 2020-06-01 MED ORDER — PROCHLORPERAZINE MALEATE 10 MG PO TABS: 10.0000 mg | ORAL_TABLET | Freq: Four times a day (QID) | ORAL | 1 refills | Status: DC | PRN

## 2020-06-01 MED ORDER — ONDANSETRON HCL 8 MG PO TABS: 8.0000 mg | ORAL_TABLET | Freq: Two times a day (BID) | ORAL | 1 refills | Status: DC | PRN

## 2020-06-01 NOTE — Progress Notes (Signed)
Patient arrived 25 minutes late to his chemo education class and was required to reschedule. Education now scheduled for 06/08/2020.  Oncology Nurse Navigator Documentation  Oncology Nurse Navigator Flowsheets 06/01/2020  Abnormal Finding Date -  Confirmed Diagnosis Date -  Diagnosis Status -  Planned Course of Treatment -  Phase of Treatment -  Chemotherapy Pending- Reason: -  Chemotherapy Actual Start Date: -  Navigator Follow Up Date: 06/08/2020  Navigator Follow Up Reason: Chemo Class  Navigator Location [facility]  Referral Date to RadOnc/MedOnc -  Navigator Encounter Type Appt/Treatment Plan Review  Telephone -  Patient Visit Type MedOnc  Treatment Phase Pre-Tx/Tx Discussion  Barriers/Navigation Needs -  Education -  Interventions -  Acuity -  Coordination of Care -  Education Method -  Support Groups/Services -  Time Spent with Patient 15

## 2020-06-04 ENCOUNTER — Other Ambulatory Visit: Payer: Medicare Other

## 2020-06-04 ENCOUNTER — Ambulatory Visit: Payer: Medicare Other

## 2020-06-08 ENCOUNTER — Other Ambulatory Visit: Payer: Self-pay

## 2020-06-08 ENCOUNTER — Inpatient Hospital Stay: Payer: Medicare Other

## 2020-06-08 ENCOUNTER — Encounter: Payer: Self-pay | Admitting: *Deleted

## 2020-06-08 NOTE — Progress Notes (Signed)
Patient in chemotherapy education class with wife  .  Discussed side effects of Velcade, Cytoxan               which include but are not limited to myelosuppression, decreased appetite, fatigue, fever, allergic or infusional reaction, mucositis, cardiac toxicity, cough, SOB, altered taste, nausea and vomiting, diarrhea, constipation, elevated LFTs myalgia and arthralgias, hair loss or thinning, rash, skin dryness, nail changes, peripheral neuropathy, discolored urine, delayed wound healing, mental changes (Chemo brain), increased risk of infections, weight loss.  Reviewed infusion room and office policy and procedure and phone numbers 24 hours x 7 days a week.  Reviewed when to call the office with any concerns or problems.  Scientist, clinical (histocompatibility and immunogenetics) given.    Antiemetic protocol and chemotherapy schedule reviewed. Patient verbalized understanding of chemotherapy indications and possible side effects.  Teachback done

## 2020-06-10 ENCOUNTER — Other Ambulatory Visit: Payer: Self-pay | Admitting: Internal Medicine

## 2020-06-11 ENCOUNTER — Other Ambulatory Visit: Payer: Self-pay

## 2020-06-11 ENCOUNTER — Other Ambulatory Visit: Payer: Self-pay | Admitting: Hematology & Oncology

## 2020-06-11 ENCOUNTER — Encounter: Payer: Self-pay | Admitting: *Deleted

## 2020-06-11 ENCOUNTER — Inpatient Hospital Stay: Payer: Medicare Other

## 2020-06-11 VITALS — BP 137/86 | HR 70 | Temp 97.8°F | Resp 18

## 2020-06-11 DIAGNOSIS — I5022 Chronic systolic (congestive) heart failure: Secondary | ICD-10-CM

## 2020-06-11 DIAGNOSIS — C9000 Multiple myeloma not having achieved remission: Secondary | ICD-10-CM

## 2020-06-11 DIAGNOSIS — Z952 Presence of prosthetic heart valve: Secondary | ICD-10-CM

## 2020-06-11 DIAGNOSIS — Z5112 Encounter for antineoplastic immunotherapy: Secondary | ICD-10-CM | POA: Diagnosis not present

## 2020-06-11 LAB — CBC WITH DIFFERENTIAL (CANCER CENTER ONLY)
Abs Immature Granulocytes: 0.02 10*3/uL (ref 0.00–0.07)
Basophils Absolute: 0.1 10*3/uL (ref 0.0–0.1)
Basophils Relative: 1 %
Eosinophils Absolute: 0.5 10*3/uL (ref 0.0–0.5)
Eosinophils Relative: 9 %
HCT: 31.3 % — ABNORMAL LOW (ref 39.0–52.0)
Hemoglobin: 10.2 g/dL — ABNORMAL LOW (ref 13.0–17.0)
Immature Granulocytes: 0 %
Lymphocytes Relative: 19 %
Lymphs Abs: 1 10*3/uL (ref 0.7–4.0)
MCH: 29.3 pg (ref 26.0–34.0)
MCHC: 32.6 g/dL (ref 30.0–36.0)
MCV: 89.9 fL (ref 80.0–100.0)
Monocytes Absolute: 0.8 10*3/uL (ref 0.1–1.0)
Monocytes Relative: 15 %
Neutro Abs: 3 10*3/uL (ref 1.7–7.7)
Neutrophils Relative %: 56 %
Platelet Count: 202 10*3/uL (ref 150–400)
RBC: 3.48 MIL/uL — ABNORMAL LOW (ref 4.22–5.81)
RDW: 17.4 % — ABNORMAL HIGH (ref 11.5–15.5)
WBC Count: 5.4 10*3/uL (ref 4.0–10.5)
nRBC: 0 % (ref 0.0–0.2)

## 2020-06-11 LAB — CMP (CANCER CENTER ONLY)
ALT: 29 U/L (ref 0–44)
AST: 46 U/L — ABNORMAL HIGH (ref 15–41)
Albumin: 3.2 g/dL — ABNORMAL LOW (ref 3.5–5.0)
Alkaline Phosphatase: 67 U/L (ref 38–126)
Anion gap: 6 (ref 5–15)
BUN: 29 mg/dL — ABNORMAL HIGH (ref 8–23)
CO2: 24 mmol/L (ref 22–32)
Calcium: 9.1 mg/dL (ref 8.9–10.3)
Chloride: 105 mmol/L (ref 98–111)
Creatinine: 2.21 mg/dL — ABNORMAL HIGH (ref 0.61–1.24)
GFR, Est AFR Am: 34 mL/min — ABNORMAL LOW (ref >60)
GFR, Estimated: 30 mL/min — ABNORMAL LOW (ref >60)
Glucose, Bld: 143 mg/dL — ABNORMAL HIGH (ref 70–99)
Potassium: 3.5 mmol/L (ref 3.5–5.1)
Sodium: 135 mmol/L (ref 135–145)
Total Bilirubin: 0.6 mg/dL (ref 0.3–1.2)
Total Protein: 9.6 g/dL — ABNORMAL HIGH (ref 6.5–8.1)

## 2020-06-11 MED ORDER — FAMCICLOVIR 500 MG PO TABS: 500.0000 mg | ORAL_TABLET | Freq: Every day | ORAL | 3 refills | Status: DC

## 2020-06-11 MED ORDER — SODIUM CHLORIDE 0.9 % IV SOLN
Freq: Once | INTRAVENOUS | Status: AC
  Filled 2020-06-11: qty 250

## 2020-06-11 MED ORDER — BORTEZOMIB CHEMO SQ INJECTION 3.5 MG (2.5MG/ML)
1.3000 mg/m2 | Freq: Once | INTRAMUSCULAR | Status: AC
  Administered 2020-06-11: 3 mg via SUBCUTANEOUS
  Filled 2020-06-11: qty 1.2

## 2020-06-11 MED ORDER — SODIUM CHLORIDE 0.9 % IV SOLN
20.0000 mg | Freq: Once | INTRAVENOUS | Status: AC
  Administered 2020-06-11: 20 mg via INTRAVENOUS
  Filled 2020-06-11: qty 20

## 2020-06-11 MED ORDER — PALONOSETRON HCL INJECTION 0.25 MG/5ML
INTRAVENOUS | Status: AC
  Filled 2020-06-11: qty 5

## 2020-06-11 MED ORDER — SODIUM CHLORIDE 0.9 % IV SOLN
400.0000 mg/m2 | Freq: Once | INTRAVENOUS | Status: AC
  Administered 2020-06-11: 900 mg via INTRAVENOUS
  Filled 2020-06-11: qty 45

## 2020-06-11 MED ORDER — PALONOSETRON HCL INJECTION 0.25 MG/5ML
0.2500 mg | Freq: Once | INTRAVENOUS | Status: AC
  Administered 2020-06-11: 0.25 mg via INTRAVENOUS

## 2020-06-11 NOTE — Progress Notes (Signed)
Ok to treat with creatinine 2.21 per Dr. Marin Olp.

## 2020-06-11 NOTE — Patient Instructions (Signed)
[facility] Discharge Instructions for Patients Receiving Chemotherapy  Today you received the following chemotherapy agents Cytoxan/Velcade   To help prevent nausea and vomiting after your treatment, we encourage you to take your nausea medication as prescribed.   If you develop nausea and vomiting that is not controlled by your nausea medication, call the clinic.   BELOW ARE SYMPTOMS THAT SHOULD BE REPORTED IMMEDIATELY:  *FEVER GREATER THAN 100.5 F  *CHILLS WITH OR WITHOUT FEVER  NAUSEA AND VOMITING THAT IS NOT CONTROLLED WITH YOUR NAUSEA MEDICATION  *UNUSUAL SHORTNESS OF BREATH  *UNUSUAL BRUISING OR BLEEDING  TENDERNESS IN MOUTH AND THROAT WITH OR WITHOUT PRESENCE OF ULCERS  *URINARY PROBLEMS  *BOWEL PROBLEMS  UNUSUAL RASH Items with * indicate a potential emergency and should be followed up as soon as possible.  Feel free to call the clinic should you have any questions or concerns. The clinic phone number is [phone].  Please show the [facility] at check-in to the Emergency Department and triage nurse.  Cyclophosphamide Injection (Cytoxan) What is this medicine? CYCLOPHOSPHAMIDE (sye kloe FOSS fa mide) is a chemotherapy drug. It slows the growth of cancer cells. This medicine is used to treat many types of cancer like lymphoma, myeloma, leukemia, breast cancer, and ovarian cancer, to name a few. This medicine may be used for other purposes; ask your health care provider or pharmacist if you have questions. COMMON BRAND NAME(S): Cytoxan, Neosar What should I tell my health care provider before I take this medicine? They need to know if you have any of these conditions:  heart disease  history of irregular heartbeat  infection  kidney disease  liver disease  low blood counts, like white cells, platelets, or red blood cells  on hemodialysis  recent or ongoing radiation therapy  scarring or thickening of the lungs  trouble passing  urine  an unusual or allergic reaction to cyclophosphamide, other medicines, foods, dyes, or preservatives  pregnant or trying to get pregnant  breast-feeding How should I use this medicine? This drug is usually given as an injection into a vein or muscle or by infusion into a vein. It is administered in a hospital or clinic by a specially trained health care professional. Talk to your pediatrician regarding the use of this medicine in children. Special care may be needed. Overdosage: If you think you have taken too much of this medicine contact a poison control center or emergency room at once. NOTE: This medicine is only for you. Do not share this medicine with others. What if I miss a dose? It is important not to miss your dose. Call your doctor or health care professional if you are unable to keep an appointment. What may interact with this medicine?  amphotericin B  azathioprine  certain antivirals for HIV or hepatitis  certain medicines for blood pressure, heart disease, irregular heart beat  certain medicines that treat or prevent blood clots like warfarin  certain other medicines for cancer  cyclosporine  etanercept  indomethacin  medicines that relax muscles for surgery  medicines to increase blood counts  metronidazole This list may not describe all possible interactions. Give your health care provider a list of all the medicines, herbs, non-prescription drugs, or dietary supplements you use. Also tell them if you smoke, drink alcohol, or use illegal drugs. Some items may interact with your medicine. What should I watch for while using this medicine? Your condition will be monitored carefully while you are receiving this  medicine. You may need blood work done while you are taking this medicine. Drink water or other fluids as directed. Urinate often, even at night. Some products may contain alcohol. Ask your health care professional if this medicine contains  alcohol. Be sure to tell all health care professionals you are taking this medicine. Certain medicines, like metronidazole and disulfiram, can cause an unpleasant reaction when taken with alcohol. The reaction includes flushing, headache, nausea, vomiting, sweating, and increased thirst. The reaction can last from 30 minutes to several hours. Do not become pregnant while taking this medicine or for 1 year after stopping it. Women should inform their health care professional if they wish to become pregnant or think they might be pregnant. Men should not father a child while taking this medicine and for 4 months after stopping it. There is potential for serious side effects to an unborn child. Talk to your health care professional for more information. Do not breast-feed an infant while taking this medicine or for 1 week after stopping it. This medicine has caused ovarian failure in some women. This medicine may make it more difficult to get pregnant. Talk to your health care professional if you are concerned about your fertility. This medicine has caused decreased sperm counts in some men. This may make it more difficult to father a child. Talk to your health care professional if you are concerned about your fertility. Call your health care professional for advice if you get a fever, chills, or sore throat, or other symptoms of a cold or flu. Do not treat yourself. This medicine decreases your body's ability to fight infections. Try to avoid being around people who are sick. Avoid taking medicines that contain aspirin, acetaminophen, ibuprofen, naproxen, or ketoprofen unless instructed by your health care professional. These medicines may hide a fever. Talk to your health care professional about your risk of cancer. You may be more at risk for certain types of cancer if you take this medicine. If you are going to need surgery or other procedure, tell your health care professional that you are using this  medicine. Be careful brushing or flossing your teeth or using a toothpick because you may get an infection or bleed more easily. If you have any dental work done, tell your dentist you are receiving this medicine. What side effects may I notice from receiving this medicine? Side effects that you should report to your doctor or health care professional as soon as possible:  allergic reactions like skin rash, itching or hives, swelling of the face, lips, or tongue  breathing problems  nausea, vomiting  signs and symptoms of bleeding such as bloody or black, tarry stools; red or dark brown urine; spitting up blood or brown material that looks like coffee grounds; red spots on the skin; unusual bruising or bleeding from the eyes, gums, or nose  signs and symptoms of heart failure like fast, irregular heartbeat, sudden weight gain; swelling of the ankles, feet, hands  signs and symptoms of infection like fever; chills; cough; sore throat; pain or trouble passing urine  signs and symptoms of kidney injury like trouble passing urine or change in the amount of urine  signs and symptoms of liver injury like dark yellow or brown urine; general ill feeling or flu-like symptoms; light-colored stools; loss of appetite; nausea; right upper belly pain; unusually weak or tired; yellowing of the eyes or skin Side effects that usually do not require medical attention (report to your doctor or health care  professional if they continue or are bothersome):  confusion  decreased hearing  diarrhea  facial flushing  hair loss  headache  loss of appetite  missed menstrual periods  signs and symptoms of low red blood cells or anemia such as unusually weak or tired; feeling faint or lightheaded; falls  skin discoloration This list may not describe all possible side effects. Call your doctor for medical advice about side effects. You may report side effects to FDA at 1-800-FDA-1088. Where should I keep  my medicine? This drug is given in a hospital or clinic and will not be stored at home. NOTE: This sheet is a summary. It may not cover all possible information. If you have questions about this medicine, talk to your doctor, pharmacist, or health care provider.  2020 Elsevier/Gold Standard (2019-06-16 09:53:29)  Bortezomib injection (Velcade) What is this medicine? BORTEZOMIB (bor TEZ oh mib) is a medicine that targets proteins in cancer cells and stops the cancer cells from growing. It is used to treat multiple myeloma and mantle-cell lymphoma. This medicine may be used for other purposes; ask your health care provider or pharmacist if you have questions. COMMON BRAND NAME(S): Velcade What should I tell my health care provider before I take this medicine? They need to know if you have any of these conditions:  diabetes  heart disease  irregular heartbeat  liver disease  on hemodialysis  low blood counts, like low white blood cells, platelets, or hemoglobin  peripheral neuropathy  taking medicine for blood pressure  an unusual or allergic reaction to bortezomib, mannitol, boron, other medicines, foods, dyes, or preservatives  pregnant or trying to get pregnant  breast-feeding How should I use this medicine? This medicine is for injection into a vein or for injection under the skin. It is given by a health care professional in a hospital or clinic setting. Talk to your pediatrician regarding the use of this medicine in children. Special care may be needed. Overdosage: If you think you have taken too much of this medicine contact a poison control center or emergency room at once. NOTE: This medicine is only for you. Do not share this medicine with others. What if I miss a dose? It is important not to miss your dose. Call your doctor or health care professional if you are unable to keep an appointment. What may interact with this medicine? This medicine may interact with the  following medications:  ketoconazole  rifampin  ritonavir  St. John's Wort This list may not describe all possible interactions. Give your health care provider a list of all the medicines, herbs, non-prescription drugs, or dietary supplements you use. Also tell them if you smoke, drink alcohol, or use illegal drugs. Some items may interact with your medicine. What should I watch for while using this medicine? You may get drowsy or dizzy. Do not drive, use machinery, or do anything that needs mental alertness until you know how this medicine affects you. Do not stand or sit up quickly, especially if you are an older patient. This reduces the risk of dizzy or fainting spells. In some cases, you may be given additional medicines to help with side effects. Follow all directions for their use. Call your doctor or health care professional for advice if you get a fever, chills or sore throat, or other symptoms of a cold or flu. Do not treat yourself. This drug decreases your body's ability to fight infections. Try to avoid being around people who are sick. This  medicine may increase your risk to bruise or bleed. Call your doctor or health care professional if you notice any unusual bleeding. You may need blood work done while you are taking this medicine. In some patients, this medicine may cause a serious brain infection that may cause death. If you have any problems seeing, thinking, speaking, walking, or standing, tell your doctor right away. If you cannot reach your doctor, urgently seek other source of medical care. Check with your doctor or health care professional if you get an attack of severe diarrhea, nausea and vomiting, or if you sweat a lot. The loss of too much body fluid can make it dangerous for you to take this medicine. Do not become pregnant while taking this medicine or for at least 7 months after stopping it. Women should inform their doctor if they wish to become pregnant or think  they might be pregnant. Men should not father a child while taking this medicine and for at least 4 months after stopping it. There is a potential for serious side effects to an unborn child. Talk to your health care professional or pharmacist for more information. Do not breast-feed an infant while taking this medicine or for 2 months after stopping it. This medicine may interfere with the ability to have a child. You should talk with your doctor or health care professional if you are concerned about your fertility. What side effects may I notice from receiving this medicine? Side effects that you should report to your doctor or health care professional as soon as possible:  allergic reactions like skin rash, itching or hives, swelling of the face, lips, or tongue  breathing problems  changes in hearing  changes in vision  fast, irregular heartbeat  feeling faint or lightheaded, falls  pain, tingling, numbness in the hands or feet  right upper belly pain  seizures  swelling of the ankles, feet, hands  unusual bleeding or bruising  unusually weak or tired  vomiting  yellowing of the eyes or skin Side effects that usually do not require medical attention (report to your doctor or health care professional if they continue or are bothersome):  changes in emotions or moods  constipation  diarrhea  loss of appetite  headache  irritation at site where injected  nausea This list may not describe all possible side effects. Call your doctor for medical advice about side effects. You may report side effects to FDA at 1-800-FDA-1088. Where should I keep my medicine? This drug is given in a hospital or clinic and will not be stored at home. NOTE: This sheet is a summary. It may not cover all possible information. If you have questions about this medicine, talk to your doctor, pharmacist, or health care provider.  2020 Elsevier/Gold Standard (2018-01-21 16:29:31)

## 2020-06-11 NOTE — Progress Notes (Signed)
Patient here to initiate treatment. He feels prepared. He has no current questions. Reviewed to call the office with any questions or concerns. Also reviewed with him the oncall service when office is closed. He plans to continue working while getting treatment.   Oncology Nurse Navigator Documentation  Oncology Nurse Navigator Flowsheets 06/11/2020  Abnormal Finding Date -  Confirmed Diagnosis Date -  Diagnosis Status -  Planned Course of Treatment -  Phase of Treatment -  Chemotherapy Pending- Reason: -  Chemotherapy Actual Start Date: 06/11/2020  Navigator Follow Up Date: 07/02/2020  Navigator Follow Up Reason: Follow-up Appointment;Chemotherapy  Navigator Location [facility]  Referral Date to RadOnc/MedOnc -  Navigator Encounter Type Treatment  Telephone -  Treatment Initiated Date 06/11/2020  Patient Visit Type MedOnc  Treatment Phase First Chemo Tx  Barriers/Navigation Needs Coordination of Care;Education;Employed  Education Other  Interventions Education;Psycho-Social Support  Acuity Level 2-Minimal Needs (1-2 Barriers Identified)  Coordination of Care -  Education Method Verbal  Support Groups/Services Friends and Family  Time Spent with Patient 30

## 2020-06-12 ENCOUNTER — Other Ambulatory Visit: Payer: Self-pay | Admitting: Internal Medicine

## 2020-06-12 MED ORDER — APIXABAN 5 MG PO TABS
5.0000 mg | ORAL_TABLET | Freq: Two times a day (BID) | ORAL | 5 refills | Status: DC
Start: 2020-06-12 — End: 2020-07-09

## 2020-06-14 ENCOUNTER — Other Ambulatory Visit: Payer: Self-pay

## 2020-06-15 ENCOUNTER — Encounter: Payer: Self-pay | Admitting: *Deleted

## 2020-06-15 NOTE — Progress Notes (Signed)
Called patient Monday afternoon and left message on his personal voice mail.   Today, I called patient again, to follow up after his first treatment on 06/11/20. Message left requesting call back on patient voicemail.   Oncology Nurse Navigator Documentation  Oncology Nurse Navigator Flowsheets 06/15/2020  Abnormal Finding Date -  Confirmed Diagnosis Date -  Diagnosis Status -  Planned Course of Treatment -  Phase of Treatment -  Chemotherapy Pending- Reason: -  Chemotherapy Actual Start Date: -  Navigator Follow Up Date: 07/02/2020  Navigator Follow Up Reason: Follow-up Appointment  Navigator Location [facility]  Referral Date to RadOnc/MedOnc -  Navigator Encounter Type Telephone  Telephone Outgoing Call;Patient Update  Treatment Initiated Date -  Patient Visit Type MedOnc  Treatment Phase Active Tx  Barriers/Navigation Needs Coordination of Care;Education;Employed  Education Other  Interventions -  Acuity Level 2-Minimal Needs (1-2 Barriers Identified)  Coordination of Care -  Education Method -  Support Groups/Services Friends and Family  Time Spent with Patient 30

## 2020-06-16 ENCOUNTER — Telehealth: Payer: Self-pay | Admitting: Hematology & Oncology

## 2020-06-16 NOTE — Telephone Encounter (Signed)
Patient has My Chart Access.  Appointment date / time changes for 10/8

## 2020-06-16 NOTE — Progress Notes (Signed)
Patient returned call today, 06/16/2020.  He is doing well. He noticed some decreased energy level over the weekend, but said he is back to baseline today. He also took compazine x one due to some mild nausea. The compazine was effective. He has his prn medication at home.  He currently has no questions or concerns. He is aware of his treatment appointments this Friday. He knows to contact the office with any needs.

## 2020-06-18 ENCOUNTER — Encounter: Payer: Self-pay | Admitting: *Deleted

## 2020-06-18 ENCOUNTER — Inpatient Hospital Stay: Payer: Medicare Other

## 2020-06-18 ENCOUNTER — Other Ambulatory Visit: Payer: Self-pay

## 2020-06-18 VITALS — BP 127/70 | HR 80 | Temp 98.0°F | Resp 18

## 2020-06-18 DIAGNOSIS — C9000 Multiple myeloma not having achieved remission: Secondary | ICD-10-CM

## 2020-06-18 DIAGNOSIS — Z5112 Encounter for antineoplastic immunotherapy: Secondary | ICD-10-CM | POA: Diagnosis not present

## 2020-06-18 LAB — COMPREHENSIVE METABOLIC PANEL
ALT: 34 U/L (ref 0–44)
AST: 46 U/L — ABNORMAL HIGH (ref 15–41)
Albumin: 3.3 g/dL — ABNORMAL LOW (ref 3.5–5.0)
Alkaline Phosphatase: 55 U/L (ref 38–126)
Anion gap: 5 (ref 5–15)
BUN: 32 mg/dL — ABNORMAL HIGH (ref 8–23)
CO2: 26 mmol/L (ref 22–32)
Calcium: 9.4 mg/dL (ref 8.9–10.3)
Chloride: 104 mmol/L (ref 98–111)
Creatinine, Ser: 2 mg/dL — ABNORMAL HIGH (ref 0.61–1.24)
GFR calc Af Amer: 39 mL/min — ABNORMAL LOW (ref >60)
GFR calc non Af Amer: 33 mL/min — ABNORMAL LOW (ref >60)
Glucose, Bld: 117 mg/dL — ABNORMAL HIGH (ref 70–99)
Potassium: 3.8 mmol/L (ref 3.5–5.1)
Sodium: 135 mmol/L (ref 135–145)
Total Bilirubin: 0.9 mg/dL (ref 0.3–1.2)
Total Protein: 8.7 g/dL — ABNORMAL HIGH (ref 6.5–8.1)

## 2020-06-18 LAB — CBC WITH DIFFERENTIAL/PLATELET
Abs Immature Granulocytes: 0.03 10*3/uL (ref 0.00–0.07)
Basophils Absolute: 0 10*3/uL (ref 0.0–0.1)
Basophils Relative: 1 %
Eosinophils Absolute: 0.4 10*3/uL (ref 0.0–0.5)
Eosinophils Relative: 8 %
HCT: 33.1 % — ABNORMAL LOW (ref 39.0–52.0)
Hemoglobin: 10.8 g/dL — ABNORMAL LOW (ref 13.0–17.0)
Immature Granulocytes: 1 %
Lymphocytes Relative: 13 %
Lymphs Abs: 0.7 10*3/uL (ref 0.7–4.0)
MCH: 29.4 pg (ref 26.0–34.0)
MCHC: 32.6 g/dL (ref 30.0–36.0)
MCV: 90.2 fL (ref 80.0–100.0)
Monocytes Absolute: 0.8 10*3/uL (ref 0.1–1.0)
Monocytes Relative: 15 %
Neutro Abs: 3.4 10*3/uL (ref 1.7–7.7)
Neutrophils Relative %: 62 %
Platelets: 198 10*3/uL (ref 150–400)
RBC: 3.67 MIL/uL — ABNORMAL LOW (ref 4.22–5.81)
RDW: 17.2 % — ABNORMAL HIGH (ref 11.5–15.5)
WBC: 5.4 10*3/uL (ref 4.0–10.5)
nRBC: 0 % (ref 0.0–0.2)

## 2020-06-18 MED ORDER — PALONOSETRON HCL INJECTION 0.25 MG/5ML
INTRAVENOUS | Status: AC
  Filled 2020-06-18: qty 5

## 2020-06-18 MED ORDER — BORTEZOMIB CHEMO SQ INJECTION 3.5 MG (2.5MG/ML)
1.3000 mg/m2 | Freq: Once | INTRAMUSCULAR | Status: AC
  Administered 2020-06-18: 3 mg via SUBCUTANEOUS
  Filled 2020-06-18: qty 1.2

## 2020-06-18 MED ORDER — PALONOSETRON HCL INJECTION 0.25 MG/5ML
0.2500 mg | Freq: Once | INTRAVENOUS | Status: AC
  Administered 2020-06-18: 0.25 mg via INTRAVENOUS

## 2020-06-18 MED ORDER — SODIUM CHLORIDE 0.9 % IV SOLN
400.0000 mg/m2 | Freq: Once | INTRAVENOUS | Status: AC
  Administered 2020-06-18: 900 mg via INTRAVENOUS
  Filled 2020-06-18: qty 45

## 2020-06-18 MED ORDER — SODIUM CHLORIDE 0.9 % IV SOLN
Freq: Once | INTRAVENOUS | Status: AC
  Filled 2020-06-18: qty 250

## 2020-06-18 MED ORDER — SODIUM CHLORIDE 0.9 % IV SOLN
20.0000 mg | Freq: Once | INTRAVENOUS | Status: AC
  Administered 2020-06-18: 20 mg via INTRAVENOUS
  Filled 2020-06-18: qty 20

## 2020-06-18 NOTE — Progress Notes (Signed)
OK to treat with today's creatinine value per Dr. Marin Olp.

## 2020-06-18 NOTE — Progress Notes (Signed)
Okay to treat with SCr = 2 per Dr. Marin Olp.

## 2020-06-18 NOTE — Progress Notes (Signed)
Oncology Nurse Navigator Documentation  Oncology Nurse Navigator Flowsheets 06/18/2020  Abnormal Finding Date -  Confirmed Diagnosis Date -  Diagnosis Status -  Planned Course of Treatment -  Phase of Treatment -  Chemotherapy Pending- Reason: -  Chemotherapy Actual Start Date: -  Navigator Follow Up Date: 07/02/2020  Navigator Follow Up Reason: Follow-up Appointment;Chemotherapy  Navigator Location [facility]  Referral Date to RadOnc/MedOnc -  Navigator Encounter Type Treatment  Telephone -  Treatment Initiated Date -  Patient Visit Type MedOnc  Treatment Phase Active Tx  Barriers/Navigation Needs Coordination of Care;Education;Employed  Education -  Interventions Coordination of Care;Psycho-Social Support  Acuity Level 2-Minimal Needs (1-2 Barriers Identified)  Coordination of Care Appts  Education Method -  Support Groups/Services Friends and Family  Time Spent with Patient 30

## 2020-06-22 ENCOUNTER — Inpatient Hospital Stay: Payer: Medicare Other | Admitting: Nutrition

## 2020-06-22 ENCOUNTER — Encounter: Payer: Self-pay | Admitting: *Deleted

## 2020-06-25 ENCOUNTER — Inpatient Hospital Stay: Payer: Medicare Other

## 2020-06-25 ENCOUNTER — Other Ambulatory Visit: Payer: Self-pay

## 2020-06-25 ENCOUNTER — Inpatient Hospital Stay: Payer: Medicare Other | Attending: Hematology & Oncology

## 2020-06-25 VITALS — BP 124/78 | HR 70 | Temp 98.3°F | Resp 17

## 2020-06-25 DIAGNOSIS — Z79899 Other long term (current) drug therapy: Secondary | ICD-10-CM | POA: Insufficient documentation

## 2020-06-25 DIAGNOSIS — Z5112 Encounter for antineoplastic immunotherapy: Secondary | ICD-10-CM | POA: Insufficient documentation

## 2020-06-25 DIAGNOSIS — C9000 Multiple myeloma not having achieved remission: Secondary | ICD-10-CM

## 2020-06-25 DIAGNOSIS — Z5111 Encounter for antineoplastic chemotherapy: Secondary | ICD-10-CM | POA: Insufficient documentation

## 2020-06-25 DIAGNOSIS — D649 Anemia, unspecified: Secondary | ICD-10-CM | POA: Insufficient documentation

## 2020-06-25 DIAGNOSIS — Z7952 Long term (current) use of systemic steroids: Secondary | ICD-10-CM | POA: Insufficient documentation

## 2020-06-25 DIAGNOSIS — Z23 Encounter for immunization: Secondary | ICD-10-CM | POA: Insufficient documentation

## 2020-06-25 DIAGNOSIS — N184 Chronic kidney disease, stage 4 (severe): Secondary | ICD-10-CM

## 2020-06-25 DIAGNOSIS — N189 Chronic kidney disease, unspecified: Secondary | ICD-10-CM | POA: Diagnosis not present

## 2020-06-25 DIAGNOSIS — Z7901 Long term (current) use of anticoagulants: Secondary | ICD-10-CM | POA: Diagnosis not present

## 2020-06-25 LAB — COMPREHENSIVE METABOLIC PANEL
ALT: 36 U/L (ref 0–44)
AST: 41 U/L (ref 15–41)
Albumin: 3.5 g/dL (ref 3.5–5.0)
Alkaline Phosphatase: 65 U/L (ref 38–126)
Anion gap: 5 (ref 5–15)
BUN: 33 mg/dL — ABNORMAL HIGH (ref 8–23)
CO2: 26 mmol/L (ref 22–32)
Calcium: 8.9 mg/dL (ref 8.9–10.3)
Chloride: 107 mmol/L (ref 98–111)
Creatinine, Ser: 2.01 mg/dL — ABNORMAL HIGH (ref 0.61–1.24)
GFR calc Af Amer: 38 mL/min — ABNORMAL LOW (ref >60)
GFR calc non Af Amer: 33 mL/min — ABNORMAL LOW (ref >60)
Glucose, Bld: 120 mg/dL — ABNORMAL HIGH (ref 70–99)
Potassium: 3.9 mmol/L (ref 3.5–5.1)
Sodium: 138 mmol/L (ref 135–145)
Total Bilirubin: 1 mg/dL (ref 0.3–1.2)
Total Protein: 6.6 g/dL (ref 6.5–8.1)

## 2020-06-25 LAB — CBC WITH DIFFERENTIAL/PLATELET
Abs Immature Granulocytes: 0.01 10*3/uL (ref 0.00–0.07)
Basophils Absolute: 0 10*3/uL (ref 0.0–0.1)
Basophils Relative: 1 %
Eosinophils Absolute: 0.1 10*3/uL (ref 0.0–0.5)
Eosinophils Relative: 2 %
HCT: 31 % — ABNORMAL LOW (ref 39.0–52.0)
Hemoglobin: 10.1 g/dL — ABNORMAL LOW (ref 13.0–17.0)
Immature Granulocytes: 0 %
Lymphocytes Relative: 9 %
Lymphs Abs: 0.5 10*3/uL — ABNORMAL LOW (ref 0.7–4.0)
MCH: 29.4 pg (ref 26.0–34.0)
MCHC: 32.6 g/dL (ref 30.0–36.0)
MCV: 90.4 fL (ref 80.0–100.0)
Monocytes Absolute: 0.7 10*3/uL (ref 0.1–1.0)
Monocytes Relative: 13 %
Neutro Abs: 3.9 10*3/uL (ref 1.7–7.7)
Neutrophils Relative %: 75 %
Platelets: 164 10*3/uL (ref 150–400)
RBC: 3.43 MIL/uL — ABNORMAL LOW (ref 4.22–5.81)
RDW: 17.6 % — ABNORMAL HIGH (ref 11.5–15.5)
WBC: 5.1 10*3/uL (ref 4.0–10.5)
nRBC: 0.4 % — ABNORMAL HIGH (ref 0.0–0.2)

## 2020-06-25 MED ORDER — PALONOSETRON HCL INJECTION 0.25 MG/5ML
INTRAVENOUS | Status: AC
  Filled 2020-06-25: qty 5

## 2020-06-25 MED ORDER — SODIUM CHLORIDE 0.9 % IV SOLN
Freq: Once | INTRAVENOUS | Status: AC
  Filled 2020-06-25: qty 250

## 2020-06-25 MED ORDER — BORTEZOMIB CHEMO SQ INJECTION 3.5 MG (2.5MG/ML)
1.3000 mg/m2 | Freq: Once | INTRAMUSCULAR | Status: AC
  Administered 2020-06-25: 3 mg via SUBCUTANEOUS
  Filled 2020-06-25: qty 1.2

## 2020-06-25 MED ORDER — PALONOSETRON HCL INJECTION 0.25 MG/5ML
0.2500 mg | Freq: Once | INTRAVENOUS | Status: AC
  Administered 2020-06-25: 0.25 mg via INTRAVENOUS

## 2020-06-25 MED ORDER — SODIUM CHLORIDE 0.9 % IV SOLN
20.0000 mg | Freq: Once | INTRAVENOUS | Status: AC
  Administered 2020-06-25: 20 mg via INTRAVENOUS
  Filled 2020-06-25: qty 20

## 2020-06-25 MED ORDER — INFLUENZA VAC SPLIT QUAD 0.5 ML IM SUSY
0.5000 mL | PREFILLED_SYRINGE | Freq: Once | INTRAMUSCULAR | Status: AC
  Administered 2020-06-25: 0.5 mL via INTRAMUSCULAR

## 2020-06-25 MED ORDER — SODIUM CHLORIDE 0.9 % IV SOLN
400.0000 mg/m2 | Freq: Once | INTRAVENOUS | Status: AC
  Administered 2020-06-25: 900 mg via INTRAVENOUS
  Filled 2020-06-25: qty 45

## 2020-06-25 MED ORDER — INFLUENZA VAC SPLIT QUAD 0.5 ML IM SUSY
PREFILLED_SYRINGE | INTRAMUSCULAR | Status: AC
  Filled 2020-06-25: qty 0.5

## 2020-06-25 NOTE — Patient Instructions (Signed)
[facility] Discharge Instructions for Patients Receiving Chemotherapy  Today you received the following chemotherapy agents Cytoxan/Velcade   To help prevent nausea and vomiting after your treatment, we encourage you to take your nausea medication as prescribed.   If you develop nausea and vomiting that is not controlled by your nausea medication, call the clinic.   BELOW ARE SYMPTOMS THAT SHOULD BE REPORTED IMMEDIATELY:  *FEVER GREATER THAN 100.5 F  *CHILLS WITH OR WITHOUT FEVER  NAUSEA AND VOMITING THAT IS NOT CONTROLLED WITH YOUR NAUSEA MEDICATION  *UNUSUAL SHORTNESS OF BREATH  *UNUSUAL BRUISING OR BLEEDING  TENDERNESS IN MOUTH AND THROAT WITH OR WITHOUT PRESENCE OF ULCERS  *URINARY PROBLEMS  *BOWEL PROBLEMS  UNUSUAL RASH Items with * indicate a potential emergency and should be followed up as soon as possible.  Feel free to call the clinic should you have any questions or concerns. The clinic phone number is [phone].  Please show the [facility] at check-in to the Emergency Department and triage nurse.

## 2020-06-25 NOTE — Progress Notes (Signed)
Reviewed pt labs with Dr. Marin Olp and pt ok to treat with creatinine 2.0

## 2020-06-29 ENCOUNTER — Telehealth: Payer: Self-pay | Admitting: Nutrition

## 2020-06-29 ENCOUNTER — Encounter: Payer: Medicare Other | Admitting: Nutrition

## 2020-06-29 NOTE — Telephone Encounter (Signed)
Contacted patient by telephone at both mobile and home numbers. He was not available but I left a message with my name and phone number for return call.

## 2020-06-30 ENCOUNTER — Telehealth: Payer: Self-pay | Admitting: Nutrition

## 2020-06-30 NOTE — Telephone Encounter (Signed)
Patient returned telephone call.  He is a 69 year old male diagnosed with multiple myeloma receiving chemotherapy with Dr. Marin Olp.  Past medical history includes chronic renal insufficiency, hypertension, hyperlipidemia, diabetes type 2, chronic kidney disease stage IV, and CHF.  Medications include Colace, Lasix, Synthroid, Zofran, Compazine, Crestor, vitamin B12, and vitamin C.  Labs include glucose 143, BUN 29, creatinine 2.21 on September 17.  Height: 5 feet 9 inches. Weight: 228.75 pounds September 1. BMI: 33.78.  Noted patient has lactose intolerance. Patient's nausea is controlled with Compazine. He has questions regarding diet during treatment with relation to his chronic kidney disease and diabetes. He verbalizes that he needs to lose weight.  Nutrition diagnosis:  Food and nutrition related knowledge deficit related to multiple myeloma and associated treatments as evidenced by no prior need for nutrition related information.  Intervention: Educated patient on importance of smaller more frequent meals and snacks with adequate calories and protein. Recommended weight maintenance.  Encouraged increased activity as tolerated. Encouraged protein from vegetables sources.  Recommended limit meat, poultry, fish to 6 to 8 ounces daily. Encouraged no concentrated sweets diet to help adequate glycemic control. Questions answered.  Teach back method used.  Patient has my contact information.  Monitoring, evaluation, goals: Patient will tolerate adequate calories and protein to promote weight stability and adequate glycemic control.  Next visit: Patient will contact me for questions or concerns.  **Disclaimer: This note was dictated with voice recognition software. Similar sounding words can inadvertently be transcribed and this note may contain transcription errors which may not have been corrected upon publication of note.**

## 2020-07-02 ENCOUNTER — Inpatient Hospital Stay: Payer: Medicare Other

## 2020-07-02 ENCOUNTER — Inpatient Hospital Stay: Payer: Medicare Other | Admitting: Hematology & Oncology

## 2020-07-09 ENCOUNTER — Inpatient Hospital Stay: Payer: Medicare Other

## 2020-07-09 ENCOUNTER — Encounter: Payer: Self-pay | Admitting: *Deleted

## 2020-07-09 ENCOUNTER — Other Ambulatory Visit: Payer: Self-pay

## 2020-07-09 ENCOUNTER — Inpatient Hospital Stay (HOSPITAL_BASED_OUTPATIENT_CLINIC_OR_DEPARTMENT_OTHER): Payer: Medicare Other | Admitting: Hematology & Oncology

## 2020-07-09 ENCOUNTER — Telehealth: Payer: Self-pay | Admitting: Hematology & Oncology

## 2020-07-09 VITALS — BP 101/54 | HR 78 | Temp 97.9°F | Resp 18 | Wt 233.0 lb

## 2020-07-09 DIAGNOSIS — C9000 Multiple myeloma not having achieved remission: Secondary | ICD-10-CM | POA: Diagnosis not present

## 2020-07-09 DIAGNOSIS — Z5112 Encounter for antineoplastic immunotherapy: Secondary | ICD-10-CM | POA: Diagnosis not present

## 2020-07-09 LAB — CBC WITH DIFFERENTIAL/PLATELET
Abs Immature Granulocytes: 0.01 10*3/uL (ref 0.00–0.07)
Basophils Absolute: 0.1 10*3/uL (ref 0.0–0.1)
Basophils Relative: 1 %
Eosinophils Absolute: 0.1 10*3/uL (ref 0.0–0.5)
Eosinophils Relative: 2 %
HCT: 32.3 % — ABNORMAL LOW (ref 39.0–52.0)
Hemoglobin: 10.3 g/dL — ABNORMAL LOW (ref 13.0–17.0)
Immature Granulocytes: 0 %
Lymphocytes Relative: 10 %
Lymphs Abs: 0.4 10*3/uL — ABNORMAL LOW (ref 0.7–4.0)
MCH: 28.9 pg (ref 26.0–34.0)
MCHC: 31.9 g/dL (ref 30.0–36.0)
MCV: 90.7 fL (ref 80.0–100.0)
Monocytes Absolute: 0.9 10*3/uL (ref 0.1–1.0)
Monocytes Relative: 20 %
Neutro Abs: 3 10*3/uL (ref 1.7–7.7)
Neutrophils Relative %: 67 %
Platelets: 228 10*3/uL (ref 150–400)
RBC: 3.56 MIL/uL — ABNORMAL LOW (ref 4.22–5.81)
RDW: 17.2 % — ABNORMAL HIGH (ref 11.5–15.5)
WBC: 4.5 10*3/uL (ref 4.0–10.5)
nRBC: 0 % (ref 0.0–0.2)

## 2020-07-09 LAB — COMPREHENSIVE METABOLIC PANEL
ALT: 25 U/L (ref 0–44)
AST: 30 U/L (ref 15–41)
Albumin: 3.9 g/dL (ref 3.5–5.0)
Alkaline Phosphatase: 70 U/L (ref 38–126)
Anion gap: 9 (ref 5–15)
BUN: 27 mg/dL — ABNORMAL HIGH (ref 8–23)
CO2: 27 mmol/L (ref 22–32)
Calcium: 9 mg/dL (ref 8.9–10.3)
Chloride: 106 mmol/L (ref 98–111)
Creatinine, Ser: 2.03 mg/dL — ABNORMAL HIGH (ref 0.61–1.24)
GFR, Estimated: 33 mL/min — ABNORMAL LOW (ref >60)
Glucose, Bld: 151 mg/dL — ABNORMAL HIGH (ref 70–99)
Potassium: 3.9 mmol/L (ref 3.5–5.1)
Sodium: 142 mmol/L (ref 135–145)
Total Bilirubin: 0.9 mg/dL (ref 0.3–1.2)
Total Protein: 5.7 g/dL — ABNORMAL LOW (ref 6.5–8.1)

## 2020-07-09 MED ORDER — BORTEZOMIB CHEMO SQ INJECTION 3.5 MG (2.5MG/ML)
1.3000 mg/m2 | Freq: Once | INTRAMUSCULAR | Status: AC
  Administered 2020-07-09: 3 mg via SUBCUTANEOUS
  Filled 2020-07-09: qty 1.2

## 2020-07-09 MED ORDER — PALONOSETRON HCL INJECTION 0.25 MG/5ML
INTRAVENOUS | Status: AC
  Filled 2020-07-09: qty 5

## 2020-07-09 MED ORDER — SODIUM CHLORIDE 0.9 % IV SOLN
20.0000 mg | Freq: Once | INTRAVENOUS | Status: AC
  Administered 2020-07-09: 20 mg via INTRAVENOUS
  Filled 2020-07-09: qty 20

## 2020-07-09 MED ORDER — SODIUM CHLORIDE 0.9 % IV SOLN
Freq: Once | INTRAVENOUS | Status: AC
  Filled 2020-07-09: qty 250

## 2020-07-09 MED ORDER — SODIUM CHLORIDE 0.9 % IV SOLN
400.0000 mg/m2 | Freq: Once | INTRAVENOUS | Status: AC
  Administered 2020-07-09: 900 mg via INTRAVENOUS
  Filled 2020-07-09: qty 45

## 2020-07-09 MED ORDER — PALONOSETRON HCL INJECTION 0.25 MG/5ML
0.2500 mg | Freq: Once | INTRAVENOUS | Status: AC
  Administered 2020-07-09: 0.25 mg via INTRAVENOUS

## 2020-07-09 NOTE — Progress Notes (Signed)
Hematology and Oncology Follow Up Visit  Eric [name]., MD [id number] April 27, 1951 69 y.o. 07/09/2020   Principle Diagnosis:   IgG kappa myeloma  - 1q+  Current Therapy:    CyBorD -- s/p cycle #1  - start on 06/04/2020     Interim History:  [name] is back for follow-up.  He has for cycle of chemotherapy.  He is done incredibly well.  His total protein is come down quite nicely.  I have to believe that the myeloma is responding.  He is still working.  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  He does not have any neuropathy in his fingers.  He has had no problems with bowels or bladder.  He does have some chronic renal insufficiency.  There is some anemia.  He does have an erythropoietin level 44.  I am sure we can probably use Aranesp if necessary.  There has been no problems with nausea or vomiting.  He has had no rashes.  Currently, his performance status is ECOG 1.  Medications:  Current Outpatient Medications:  Marland Kitchen  VITAMIN D PO, Take 1 capsule by mouth daily., Disp: , Rfl:  .  acetaminophen (TYLENOL) 500 MG tablet, Take 1,000 mg by mouth every 4 (four) hours as needed for moderate pain or headache. , Disp: , Rfl:  .  amiodarone (PACERONE) 100 MG tablet, Take 1 tablet (100 mg total) by mouth daily., Disp: 90 tablet, Rfl: 3 .  carvedilol (COREG) 6.25 MG tablet, TAKE 1 TABLET(6.25 MG) BY MOUTH TWICE DAILY, Disp: 60 tablet, Rfl: 0 .  docusate sodium (COLACE) 100 MG capsule, Take 100 mg by mouth daily as needed for mild constipation., Disp: , Rfl:  .  ELIQUIS 5 MG TABS tablet, TAKE 1 TABLET BY MOUTH TWICE DAILY., Disp: 60 tablet, Rfl: 0 .  famciclovir (FAMVIR) 500 MG tablet, Take 1 tablet (500 mg total) by mouth daily. (Patient not taking: Reported on 07/09/2020), Disp: 30 tablet, Rfl: 3 .  fluticasone (FLONASE) 50 MCG/ACT nasal spray, Place 2 sprays into the nose as needed for allergies. , Disp: , Rfl:  .  furosemide (LASIX) 40 MG tablet, Take 1.5 tablets  (60 mg total) by mouth daily., Disp: 135 tablet, Rfl: 1 .  Hypromellose (ARTIFICIAL TEARS OP), Place 1 drop into both eyes daily as needed (dry eyes)., Disp: , Rfl:  .  ipratropium (ATROVENT) 0.06 % nasal spray, Place 2 sprays into both nostrils 2 (two) times daily., Disp: 15 mL, Rfl: 3 .  isosorbide-hydrALAZINE (BIDIL) 20-37.5 MG tablet, Take 1 tablet by mouth 3 (three) times daily., Disp: 270 tablet, Rfl: 3 .  levothyroxine (SYNTHROID) 75 MCG tablet, TAKE 1 TABLET BY MOUTH DAILY IN THE MORNING EXCEPT TAKE 1/2 TABLET ON SUNDAYS IN THE MORNING, Disp: 90 tablet, Rfl: 0 .  ondansetron (ZOFRAN) 8 MG tablet, Take 1 tablet (8 mg total) by mouth 2 (two) times daily as needed for refractory nausea / vomiting. Start on day 3 after Cytoxan., Disp: 30 tablet, Rfl: 1 .  potassium chloride 20 MEQ/15ML (10%) SOLN, Take 15 mLs (20 mEq total) by mouth daily., Disp: 473 mL, Rfl: 6 .  prochlorperazine (COMPAZINE) 10 MG tablet, Take 1 tablet (10 mg total) by mouth every 6 (six) hours as needed (Nausea or vomiting)., Disp: 30 tablet, Rfl: 1 .  rosuvastatin (CRESTOR) 20 MG tablet, Take 1 tablet (20 mg total) by mouth daily., Disp: 90 tablet, Rfl: 2 .  vitamin B-12 (CYANOCOBALAMIN) 100 MCG tablet, Take  100 mcg by mouth daily. , Disp: , Rfl:  .  vitamin C (ASCORBIC ACID) 500 MG tablet, Take 500 mg by mouth as needed. , Disp: , Rfl:  No current facility-administered medications for this visit.  Facility-Administered Medications Ordered in Other Visits:  .  0.9 %  sodium chloride infusion, , Intravenous, Once, Maryalyce Sanjuan, [name], MD .  bortezomib SQ (VELCADE) chemo injection (2.5mg /mL concentration) 3 mg, 1.3 mg/m2 (Treatment Plan Recorded), Subcutaneous, Once, Jadasia Haws, [name], MD .  cyclophosphamide (CYTOXAN) 900 mg in sodium chloride 0.9 % 250 mL chemo infusion, 400 mg/m2 (Treatment Plan Recorded), Intravenous, Once, Trammell Bowden, [name], MD .  dexamethasone (DECADRON) 20 mg in sodium chloride 0.9 % 50 mL IVPB, 20 mg,  Intravenous, Once, Ezzie Senat, [name], MD .  palonosetron (ALOXI) injection 0.25 mg, 0.25 mg, Intravenous, Once, Gee Habig, [name], MD  Allergies:  Allergies  Allergen Reactions  . Lactose Intolerance (Gi) Diarrhea    Past Medical History, Surgical history, Social history, and Family History were reviewed and updated.  Review of Systems: Review of Systems  Constitutional: Negative.   HENT:  Negative.   Eyes: Negative.   Respiratory: Negative.   Cardiovascular: Negative.   Gastrointestinal: Negative.   Endocrine: Negative.   Genitourinary: Negative.    Musculoskeletal: Negative.   Skin: Negative.   Neurological: Negative.   Hematological: Negative.   Psychiatric/Behavioral: Negative.     Physical Exam:  weight is 233 lb (105.7 kg). His oral temperature is 97.9 F (36.6 C). His blood pressure is 101/54 (abnormal) and his pulse is 78. His respiration is 18 and oxygen saturation is 100%.   Wt Readings from Last 3 Encounters:  07/09/20 233 lb (105.7 kg)  05/26/20 228 lb 12 oz (103.8 kg)  05/21/20 226 lb (102.5 kg)    Physical Exam Vitals reviewed.  HENT:     Head: Normocephalic and atraumatic.  Eyes:     Pupils: Pupils are equal, round, and reactive to light.  Cardiovascular:     Rate and Rhythm: Normal rate and regular rhythm.     Heart sounds: Normal heart sounds.  Pulmonary:     Effort: Pulmonary effort is normal.     Breath sounds: Normal breath sounds.  Abdominal:     General: Bowel sounds are normal.     Palpations: Abdomen is soft.  Musculoskeletal:        General: No tenderness or deformity. Normal range of motion.     Cervical back: Normal range of motion.  Lymphadenopathy:     Cervical: No cervical adenopathy.  Skin:    General: Skin is warm and dry.     Findings: No erythema or rash.  Neurological:     Mental Status: He is alert and oriented to person, place, and time.  Psychiatric:        Behavior: Behavior normal.        Thought Content: Thought  content normal.        Judgment: Judgment normal.      Lab Results  Component Value Date   WBC 4.5 07/09/2020   HGB 10.3 (L) 07/09/2020   HCT 32.3 (L) 07/09/2020   MCV 90.7 07/09/2020   PLT 228 07/09/2020     Chemistry      Component Value Date/Time   NA 142 07/09/2020 1137   NA 137 04/22/2020 1650   K 3.9 07/09/2020 1137   CL 106 07/09/2020 1137   CO2 27 07/09/2020 1137   BUN 27 (H) 07/09/2020 1137  BUN 24 04/22/2020 1650   CREATININE 2.03 (H) 07/09/2020 1137   CREATININE 2.21 (H) 06/11/2020 1016   CREATININE 1.95 (H) 09/08/2016 0859      Component Value Date/Time   CALCIUM 9.0 07/09/2020 1137   ALKPHOS 70 07/09/2020 1137   AST 30 07/09/2020 1137   AST 46 (H) 06/11/2020 1016   ALT 25 07/09/2020 1137   ALT 29 06/11/2020 1016   BILITOT 0.9 07/09/2020 1137   BILITOT 0.6 06/11/2020 1016      Impression and Plan: Mr. Mcinturff is a very nice 69 year old African-American male.  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  He has IgG kappa myeloma.  I do not think that he has bad cytogenetics or FISH.  There is been no problems with the treatment.  Again I think is working nicely.  We will see what his myeloma studies look like.  We will plan for a second cycle to start today.  We will have to watch the anemia.  If his hemoglobin drops more, then we may have to consider Aranesp.  I will plan to get him back here in another 3 to 4 weeks.   Volanda Napoleon, MD 10/15/202112:48 PM

## 2020-07-09 NOTE — Progress Notes (Signed)
Pt discharged in no apparent distress. Pt left ambulatory without assistance. Pt aware of discharge instructions and verbalized understanding and had no further questions.  

## 2020-07-09 NOTE — Progress Notes (Signed)
Oncology Nurse Navigator Documentation  Oncology Nurse Navigator Flowsheets 07/09/2020  Abnormal Finding Date -  Confirmed Diagnosis Date -  Diagnosis Status -  Planned Course of Treatment -  Phase of Treatment -  Chemotherapy Pending- Reason: -  Chemotherapy Actual Start Date: -  Navigator Follow Up Date: 08/04/2020  Navigator Follow Up Reason: Follow-up Appointment  Navigator Location [facility]  Referral Date to RadOnc/MedOnc -  Navigator Encounter Type Treatment;Appt/Treatment Plan Review  Telephone -  Treatment Initiated Date -  Patient Visit Type MedOnc  Treatment Phase Active Tx  Barriers/Navigation Needs Coordination of Care;Education;Employed  Education -  Interventions Psycho-Social Support  Acuity Level 2-Minimal Needs (1-2 Barriers Identified)  Coordination of Care -  Education Method -  Support Groups/Services Friends and Family  Time Spent with Patient 15

## 2020-07-09 NOTE — Progress Notes (Signed)
Okay to treat today with SCr 2.03 per Dr. Marin Olp.

## 2020-07-09 NOTE — Patient Instructions (Signed)
[facility] Discharge Instructions for Patients Receiving Chemotherapy  Today you received the following chemotherapy agents Cytoxan/Velcade   To help prevent nausea and vomiting after your treatment, we encourage you to take your nausea medication as prescribed.   If you develop nausea and vomiting that is not controlled by your nausea medication, call the clinic.   BELOW ARE SYMPTOMS THAT SHOULD BE REPORTED IMMEDIATELY:  *FEVER GREATER THAN 100.5 F  *CHILLS WITH OR WITHOUT FEVER  NAUSEA AND VOMITING THAT IS NOT CONTROLLED WITH YOUR NAUSEA MEDICATION  *UNUSUAL SHORTNESS OF BREATH  *UNUSUAL BRUISING OR BLEEDING  TENDERNESS IN MOUTH AND THROAT WITH OR WITHOUT PRESENCE OF ULCERS  *URINARY PROBLEMS  *BOWEL PROBLEMS  UNUSUAL RASH Items with * indicate a potential emergency and should be followed up as soon as possible.  Feel free to call the clinic should you have any questions or concerns. The clinic phone number is [phone].  Please show the [facility] at check-in to the Emergency Department and triage nurse.

## 2020-07-09 NOTE — Telephone Encounter (Signed)
Appointments scheduled updated calendar was printed for patient per 10/15 los

## 2020-07-11 ENCOUNTER — Other Ambulatory Visit: Payer: Self-pay | Admitting: Internal Medicine

## 2020-07-16 ENCOUNTER — Other Ambulatory Visit: Payer: Self-pay

## 2020-07-16 ENCOUNTER — Inpatient Hospital Stay: Payer: Medicare Other

## 2020-07-16 VITALS — BP 93/45 | HR 77 | Temp 97.9°F | Resp 18

## 2020-07-16 DIAGNOSIS — Z5112 Encounter for antineoplastic immunotherapy: Secondary | ICD-10-CM | POA: Diagnosis not present

## 2020-07-16 DIAGNOSIS — C9000 Multiple myeloma not having achieved remission: Secondary | ICD-10-CM

## 2020-07-16 LAB — CBC WITH DIFFERENTIAL/PLATELET
Abs Immature Granulocytes: 0.02 10*3/uL (ref 0.00–0.07)
Basophils Absolute: 0 10*3/uL (ref 0.0–0.1)
Basophils Relative: 1 %
Eosinophils Absolute: 0.1 10*3/uL (ref 0.0–0.5)
Eosinophils Relative: 2 %
HCT: 32 % — ABNORMAL LOW (ref 39.0–52.0)
Hemoglobin: 10.4 g/dL — ABNORMAL LOW (ref 13.0–17.0)
Immature Granulocytes: 0 %
Lymphocytes Relative: 10 %
Lymphs Abs: 0.5 10*3/uL — ABNORMAL LOW (ref 0.7–4.0)
MCH: 29 pg (ref 26.0–34.0)
MCHC: 32.5 g/dL (ref 30.0–36.0)
MCV: 89.1 fL (ref 80.0–100.0)
Monocytes Absolute: 1 10*3/uL (ref 0.1–1.0)
Monocytes Relative: 20 %
Neutro Abs: 3.4 10*3/uL (ref 1.7–7.7)
Neutrophils Relative %: 67 %
Platelets: 188 10*3/uL (ref 150–400)
RBC: 3.59 MIL/uL — ABNORMAL LOW (ref 4.22–5.81)
RDW: 16.9 % — ABNORMAL HIGH (ref 11.5–15.5)
WBC: 5 10*3/uL (ref 4.0–10.5)
nRBC: 0 % (ref 0.0–0.2)

## 2020-07-16 LAB — COMPREHENSIVE METABOLIC PANEL
ALT: 26 U/L (ref 0–44)
AST: 27 U/L (ref 15–41)
Albumin: 3.8 g/dL (ref 3.5–5.0)
Alkaline Phosphatase: 62 U/L (ref 38–126)
Anion gap: 9 (ref 5–15)
BUN: 37 mg/dL — ABNORMAL HIGH (ref 8–23)
CO2: 26 mmol/L (ref 22–32)
Calcium: 9.1 mg/dL (ref 8.9–10.3)
Chloride: 105 mmol/L (ref 98–111)
Creatinine, Ser: 2.13 mg/dL — ABNORMAL HIGH (ref 0.61–1.24)
GFR, Estimated: 33 mL/min — ABNORMAL LOW (ref >60)
Glucose, Bld: 164 mg/dL — ABNORMAL HIGH (ref 70–99)
Potassium: 3.8 mmol/L (ref 3.5–5.1)
Sodium: 140 mmol/L (ref 135–145)
Total Bilirubin: 0.9 mg/dL (ref 0.3–1.2)
Total Protein: 5.9 g/dL — ABNORMAL LOW (ref 6.5–8.1)

## 2020-07-16 MED ORDER — SODIUM CHLORIDE 0.9 % IV SOLN
Freq: Once | INTRAVENOUS | Status: AC
  Filled 2020-07-16: qty 250

## 2020-07-16 MED ORDER — PALONOSETRON HCL INJECTION 0.25 MG/5ML
INTRAVENOUS | Status: AC
  Filled 2020-07-16: qty 5

## 2020-07-16 MED ORDER — SODIUM CHLORIDE 0.9 % IV SOLN
20.0000 mg | Freq: Once | INTRAVENOUS | Status: AC
  Administered 2020-07-16: 20 mg via INTRAVENOUS
  Filled 2020-07-16: qty 20

## 2020-07-16 MED ORDER — PALONOSETRON HCL INJECTION 0.25 MG/5ML
0.2500 mg | Freq: Once | INTRAVENOUS | Status: AC
  Administered 2020-07-16: 0.25 mg via INTRAVENOUS

## 2020-07-16 MED ORDER — BORTEZOMIB CHEMO SQ INJECTION 3.5 MG (2.5MG/ML)
1.3000 mg/m2 | Freq: Once | INTRAMUSCULAR | Status: AC
  Administered 2020-07-16: 3 mg via SUBCUTANEOUS
  Filled 2020-07-16: qty 1.2

## 2020-07-16 MED ORDER — SODIUM CHLORIDE 0.9 % IV SOLN
400.0000 mg/m2 | Freq: Once | INTRAVENOUS | Status: AC
  Administered 2020-07-16: 900 mg via INTRAVENOUS
  Filled 2020-07-16: qty 45

## 2020-07-16 NOTE — Patient Instructions (Addendum)
[facility] Discharge Instructions for Patients Receiving Chemotherapy  Today you received the following chemotherapy agents Cytoxan/Velcade   To help prevent nausea and vomiting after your treatment, we encourage you to take your nausea medication as prescribed.   If you develop nausea and vomiting that is not controlled by your nausea medication, call the clinic.   BELOW ARE SYMPTOMS THAT SHOULD BE REPORTED IMMEDIATELY:  *FEVER GREATER THAN 100.5 F  *CHILLS WITH OR WITHOUT FEVER  NAUSEA AND VOMITING THAT IS NOT CONTROLLED WITH YOUR NAUSEA MEDICATION  *UNUSUAL SHORTNESS OF BREATH  *UNUSUAL BRUISING OR BLEEDING  TENDERNESS IN MOUTH AND THROAT WITH OR WITHOUT PRESENCE OF ULCERS  *URINARY PROBLEMS  *BOWEL PROBLEMS  UNUSUAL RASH Items with * indicate a potential emergency and should be followed up as soon as possible.  Feel free to call the clinic should you have any questions or concerns. The clinic phone number is [phone].  Please show the [facility] at check-in to the Emergency Department and triage nurse.  Palonosetron Injection What is this medicine? PALONOSETRON (pal oh NOE se tron) is used to prevent nausea and vomiting caused by chemotherapy. It also helps prevent delayed nausea and vomiting that may occur a few days after your treatment. This medicine may be used for other purposes; ask your health care provider or pharmacist if you have questions. COMMON BRAND NAME(S): Aloxi What should I tell my health care provider before I take this medicine? They need to know if you have any of these conditions: an unusual or allergic reaction to palonosetron, dolasetron, granisetron, ondansetron, other medicines, foods, dyes, or preservatives pregnant or trying to get pregnant breast-feeding How should I use this medicine? This medicine is for infusion into a vein. It is given by a health care professional in a hospital or clinic setting. Talk to  your pediatrician regarding the use of this medicine in children. While this drug may be prescribed for children as young as 1 month for selected conditions, precautions do apply. Overdosage: If you think you have taken too much of this medicine contact a poison control center or emergency room at once. NOTE: This medicine is only for you. Do not share this medicine with others. What if I miss a dose? This does not apply. What may interact with this medicine? certain medicines for depression, anxiety, or psychotic disturbances fentanyl linezolid MAOIs like Carbex, Eldepryl, Marplan, Nardil, and Parnate methylene blue (injected into a vein) tramadol This list may not describe all possible interactions. Give your health care provider a list of all the medicines, herbs, non-prescription drugs, or dietary supplements you use. Also tell them if you smoke, drink alcohol, or use illegal drugs. Some items may interact with your medicine. What should I watch for while using this medicine? Your condition will be monitored carefully while you are receiving this medicine. What side effects may I notice from receiving this medicine? Side effects that you should report to your doctor or health care professional as soon as possible: allergic reactions like skin rash, itching or hives, swelling of the face, lips, or tongue breathing problems confusion dizziness fast, irregular heartbeat fever and chills loss of balance or coordination seizures sweating swelling of the hands and feet tremors unusually weak or tired Side effects that usually do not require medical attention (report to your doctor or health care professional if they continue or are bothersome): constipation or diarrhea headache This list may not describe all possible side effects. Call your doctor  for medical advice about side effects. You may report side effects to FDA at 1-800-FDA-1088. Where should I keep my medicine? This drug is  given in a hospital or clinic and will not be stored at home. NOTE: This sheet is a summary. It may not cover all possible information. If you have questions about this medicine, talk to your doctor, pharmacist, or health care provider.  2020 Elsevier/Gold Standard (2013-07-18 10:38:36) Dexamethasone injection What is this medicine? DEXAMETHASONE (dex a METH a sone) is a corticosteroid. It is used to treat inflammation of the skin, joints, lungs, and other organs. Common conditions treated include asthma, allergies, and arthritis. It is also used for other conditions, like blood disorders and diseases of the adrenal glands. This medicine may be used for other purposes; ask your health care provider or pharmacist if you have questions. COMMON BRAND NAME(S): Decadron, DoubleDex, Simplist Dexamethasone, Solurex What should I tell my health care provider before I take this medicine? They need to know if you have any of these conditions: Cushing's syndrome diabetes glaucoma heart disease high blood pressure infection like herpes, measles, tuberculosis, or chickenpox kidney disease liver disease mental illness myasthenia gravis osteoporosis previous heart attack seizures stomach or intestine problems thyroid disease an unusual or allergic reaction to dexamethasone, corticosteroids, other medicines, lactose, foods, dyes, or preservatives pregnant or trying to get pregnant breast-feeding How should I use this medicine? This medicine is for injection into a muscle, joint, lesion, soft tissue, or vein. It is given by a health care professional in a hospital or clinic setting. Talk to your pediatrician regarding the use of this medicine in children. Special care may be needed. Overdosage: If you think you have taken too much of this medicine contact a poison control center or emergency room at once. NOTE: This medicine is only for you. Do not share this medicine with others. What if I miss a  dose? This may not apply. If you are having a series of injections over a prolonged period, try not to miss an appointment. Call your doctor or health care professional to reschedule if you are unable to keep an appointment. What may interact with this medicine? Do not take this medicine with any of the following medications: live virus vaccines This medicine may also interact with the following medications: aminoglutethimide amphotericin B aspirin and aspirin-like medicines certain antibiotics like erythromycin, clarithromycin, and troleandomycin certain antivirals for HIV or hepatitis certain medicines for seizures like carbamazepine, phenobarbital, phenytoin certain medicines to treat myasthenia gravis cholestyramine cyclosporine digoxin diuretics ephedrine male hormones, like estrogen or progestins and birth control pills insulin or other medicines for diabetes isoniazid ketoconazole medicines that relax muscles for surgery mifepristone NSAIDs, medicines for pain and inflammation, like ibuprofen or naproxen rifampin skin tests for allergies thalidomide vaccines warfarin This list may not describe all possible interactions. Give your health care provider a list of all the medicines, herbs, non-prescription drugs, or dietary supplements you use. Also tell them if you smoke, drink alcohol, or use illegal drugs. Some items may interact with your medicine. What should I watch for while using this medicine? Visit your health care professional for regular checks on your progress. Tell your health care professional if your symptoms do not start to get better or if they get worse. Your condition will be monitored carefully while you are receiving this medicine. Wear a medical ID bracelet or chain. Carry a card that describes your disease and details of your medicine and dosage times. This medicine may  increase your risk of getting an infection. Call your health care professional for  advice if you get a fever, chills, or sore throat, or other symptoms of a cold or flu. Do not treat yourself. Try to avoid being around people who are sick. Call your health care professional if you are around anyone with measles, chickenpox, or if you develop sores or blisters that do not heal properly. If you are going to need surgery or other procedures, tell your doctor or health care professional that you have taken this medicine within the last 12 months. Ask your doctor or health care professional about your diet. You may need to lower the amount of salt you eat. This medicine may increase blood sugar. Ask your healthcare provider if changes in diet or medicines are needed if you have diabetes. What side effects may I notice from receiving this medicine? Side effects that you should report to your doctor or health care professional as soon as possible: allergic reactions like skin rash, itching or hives, swelling of the face, lips, or tongue bloody or black, tarry stools changes in emotions or moods changes in vision confusion, excitement, restlessness depressed mood eye pain hallucinations muscle weakness severe or sudden stomach or belly pain signs and symptoms of high blood sugar such as being more thirsty or hungry or having to urinate more than normal. You may also feel very tired or have blurry vision. signs and symptoms of infection like fever; chills; cough; sore throat; pain or trouble passing urine swelling of ankles, feet unusual bruising or bleeding wounds that do not heal Side effects that usually do not require medical attention (report to your doctor or health care professional if they continue or are bothersome): increased appetite increased growth of face or body hair headache nausea, vomiting pain, redness, or irritation at site where injected skin problems, acne, thin and shiny skin trouble sleeping weight gain This list may not describe all possible side  effects. Call your doctor for medical advice about side effects. You may report side effects to FDA at 1-800-FDA-1088. Where should I keep my medicine? This medicine is given in a hospital or clinic and will not be stored at home. NOTE: This sheet is a summary. It may not cover all possible information. If you have questions about this medicine, talk to your doctor, pharmacist, or health care provider.  2020 Elsevier/Gold Standard (2019-03-25 13:51:58) Cyclophosphamide Injection What is this medicine? CYCLOPHOSPHAMIDE (sye kloe FOSS fa mide) is a chemotherapy drug. It slows the growth of cancer cells. This medicine is used to treat many types of cancer like lymphoma, myeloma, leukemia, breast cancer, and ovarian cancer, to name a few. This medicine may be used for other purposes; ask your health care provider or pharmacist if you have questions. COMMON BRAND NAME(S): Cytoxan, Neosar What should I tell my health care provider before I take this medicine? They need to know if you have any of these conditions: heart disease history of irregular heartbeat infection kidney disease liver disease low blood counts, like white cells, platelets, or red blood cells on hemodialysis recent or ongoing radiation therapy scarring or thickening of the lungs trouble passing urine an unusual or allergic reaction to cyclophosphamide, other medicines, foods, dyes, or preservatives pregnant or trying to get pregnant breast-feeding How should I use this medicine? This drug is usually given as an injection into a vein or muscle or by infusion into a vein. It is administered in a hospital or clinic by a specially  trained health care professional. Talk to your pediatrician regarding the use of this medicine in children. Special care may be needed. Overdosage: If you think you have taken too much of this medicine contact a poison control center or emergency room at once. NOTE: This medicine is only for you. Do  not share this medicine with others. What if I miss a dose? It is important not to miss your dose. Call your doctor or health care professional if you are unable to keep an appointment. What may interact with this medicine? amphotericin B azathioprine certain antivirals for HIV or hepatitis certain medicines for blood pressure, heart disease, irregular heart beat certain medicines that treat or prevent blood clots like warfarin certain other medicines for cancer cyclosporine etanercept indomethacin medicines that relax muscles for surgery medicines to increase blood counts metronidazole This list may not describe all possible interactions. Give your health care provider a list of all the medicines, herbs, non-prescription drugs, or dietary supplements you use. Also tell them if you smoke, drink alcohol, or use illegal drugs. Some items may interact with your medicine. What should I watch for while using this medicine? Your condition will be monitored carefully while you are receiving this medicine. You may need blood work done while you are taking this medicine. Drink water or other fluids as directed. Urinate often, even at night. Some products may contain alcohol. Ask your health care professional if this medicine contains alcohol. Be sure to tell all health care professionals you are taking this medicine. Certain medicines, like metronidazole and disulfiram, can cause an unpleasant reaction when taken with alcohol. The reaction includes flushing, headache, nausea, vomiting, sweating, and increased thirst. The reaction can last from 30 minutes to several hours. Do not become pregnant while taking this medicine or for 1 year after stopping it. Women should inform their health care professional if they wish to become pregnant or think they might be pregnant. Men should not father a child while taking this medicine and for 4 months after stopping it. There is potential for serious side effects  to an unborn child. Talk to your health care professional for more information. Do not breast-feed an infant while taking this medicine or for 1 week after stopping it. This medicine has caused ovarian failure in some women. This medicine may make it more difficult to get pregnant. Talk to your health care professional if you are concerned about your fertility. This medicine has caused decreased sperm counts in some men. This may make it more difficult to father a child. Talk to your health care professional if you are concerned about your fertility. Call your health care professional for advice if you get a fever, chills, or sore throat, or other symptoms of a cold or flu. Do not treat yourself. This medicine decreases your body's ability to fight infections. Try to avoid being around people who are sick. Avoid taking medicines that contain aspirin, acetaminophen, ibuprofen, naproxen, or ketoprofen unless instructed by your health care professional. These medicines may hide a fever. Talk to your health care professional about your risk of cancer. You may be more at risk for certain types of cancer if you take this medicine. If you are going to need surgery or other procedure, tell your health care professional that you are using this medicine. Be careful brushing or flossing your teeth or using a toothpick because you may get an infection or bleed more easily. If you have any dental work done, tell your dentist you  are receiving this medicine. What side effects may I notice from receiving this medicine? Side effects that you should report to your doctor or health care professional as soon as possible: allergic reactions like skin rash, itching or hives, swelling of the face, lips, or tongue breathing problems nausea, vomiting signs and symptoms of bleeding such as bloody or black, tarry stools; red or dark brown urine; spitting up blood or brown material that looks like coffee grounds; red spots on  the skin; unusual bruising or bleeding from the eyes, gums, or nose signs and symptoms of heart failure like fast, irregular heartbeat, sudden weight gain; swelling of the ankles, feet, hands signs and symptoms of infection like fever; chills; cough; sore throat; pain or trouble passing urine signs and symptoms of kidney injury like trouble passing urine or change in the amount of urine signs and symptoms of liver injury like dark yellow or brown urine; general ill feeling or flu-like symptoms; light-colored stools; loss of appetite; nausea; right upper belly pain; unusually weak or tired; yellowing of the eyes or skin Side effects that usually do not require medical attention (report to your doctor or health care professional if they continue or are bothersome): confusion decreased hearing diarrhea facial flushing hair loss headache loss of appetite missed menstrual periods signs and symptoms of low red blood cells or anemia such as unusually weak or tired; feeling faint or lightheaded; falls skin discoloration This list may not describe all possible side effects. Call your doctor for medical advice about side effects. You may report side effects to FDA at 1-800-FDA-1088. Where should I keep my medicine? This drug is given in a hospital or clinic and will not be stored at home. NOTE: This sheet is a summary. It may not cover all possible information. If you have questions about this medicine, talk to your doctor, pharmacist, or health care provider.  2020 Elsevier/Gold Standard (2019-06-16 09:53:29) Bortezomib injection What is this medicine? BORTEZOMIB (bor TEZ oh mib) is a medicine that targets proteins in cancer cells and stops the cancer cells from growing. It is used to treat multiple myeloma and mantle-cell lymphoma. This medicine may be used for other purposes; ask your health care provider or pharmacist if you have questions. COMMON BRAND NAME(S): Velcade What should I tell my  health care provider before I take this medicine? They need to know if you have any of these conditions: diabetes heart disease irregular heartbeat liver disease on hemodialysis low blood counts, like low white blood cells, platelets, or hemoglobin peripheral neuropathy taking medicine for blood pressure an unusual or allergic reaction to bortezomib, mannitol, boron, other medicines, foods, dyes, or preservatives pregnant or trying to get pregnant breast-feeding How should I use this medicine? This medicine is for injection into a vein or for injection under the skin. It is given by a health care professional in a hospital or clinic setting. Talk to your pediatrician regarding the use of this medicine in children. Special care may be needed. Overdosage: If you think you have taken too much of this medicine contact a poison control center or emergency room at once. NOTE: This medicine is only for you. Do not share this medicine with others. What if I miss a dose? It is important not to miss your dose. Call your doctor or health care professional if you are unable to keep an appointment. What may interact with this medicine? This medicine may interact with the following medications: ketoconazole rifampin ritonavir [facility] John's Wort This  list may not describe all possible interactions. Give your health care provider a list of all the medicines, herbs, non-prescription drugs, or dietary supplements you use. Also tell them if you smoke, drink alcohol, or use illegal drugs. Some items may interact with your medicine. What should I watch for while using this medicine? You may get drowsy or dizzy. Do not drive, use machinery, or do anything that needs mental alertness until you know how this medicine affects you. Do not stand or sit up quickly, especially if you are an older patient. This reduces the risk of dizzy or fainting spells. In some cases, you may be given additional medicines to help  with side effects. Follow all directions for their use. Call your doctor or health care professional for advice if you get a fever, chills or sore throat, or other symptoms of a cold or flu. Do not treat yourself. This drug decreases your body's ability to fight infections. Try to avoid being around people who are sick. This medicine may increase your risk to bruise or bleed. Call your doctor or health care professional if you notice any unusual bleeding. You may need blood work done while you are taking this medicine. In some patients, this medicine may cause a serious brain infection that may cause death. If you have any problems seeing, thinking, speaking, walking, or standing, tell your doctor right away. If you cannot reach your doctor, urgently seek other source of medical care. Check with your doctor or health care professional if you get an attack of severe diarrhea, nausea and vomiting, or if you sweat a lot. The loss of too much body fluid can make it dangerous for you to take this medicine. Do not become pregnant while taking this medicine or for at least 7 months after stopping it. Women should inform their doctor if they wish to become pregnant or think they might be pregnant. Men should not father a child while taking this medicine and for at least 4 months after stopping it. There is a potential for serious side effects to an unborn child. Talk to your health care professional or pharmacist for more information. Do not breast-feed an infant while taking this medicine or for 2 months after stopping it. This medicine may interfere with the ability to have a child. You should talk with your doctor or health care professional if you are concerned about your fertility. What side effects may I notice from receiving this medicine? Side effects that you should report to your doctor or health care professional as soon as possible: allergic reactions like skin rash, itching or hives, swelling of the  face, lips, or tongue breathing problems changes in hearing changes in vision fast, irregular heartbeat feeling faint or lightheaded, falls pain, tingling, numbness in the hands or feet right upper belly pain seizures swelling of the ankles, feet, hands unusual bleeding or bruising unusually weak or tired vomiting yellowing of the eyes or skin Side effects that usually do not require medical attention (report to your doctor or health care professional if they continue or are bothersome): changes in emotions or moods constipation diarrhea loss of appetite headache irritation at site where injected nausea This list may not describe all possible side effects. Call your doctor for medical advice about side effects. You may report side effects to FDA at 1-800-FDA-1088. Where should I keep my medicine? This drug is given in a hospital or clinic and will not be stored at home. NOTE: This sheet is a  summary. It may not cover all possible information. If you have questions about this medicine, talk to your doctor, pharmacist, or health care provider.  2020 Elsevier/Gold Standard (2018-01-21 16:29:31)

## 2020-07-16 NOTE — Progress Notes (Signed)
Pt stable at discharge.  

## 2020-07-16 NOTE — Progress Notes (Signed)
Cbc and cmet reviewed by Judson Roch, PA. VO " ok to treat despite counts"

## 2020-07-22 ENCOUNTER — Telehealth: Payer: Self-pay | Admitting: Interventional Cardiology

## 2020-07-22 DIAGNOSIS — N184 Chronic kidney disease, stage 4 (severe): Secondary | ICD-10-CM

## 2020-07-22 DIAGNOSIS — I5022 Chronic systolic (congestive) heart failure: Secondary | ICD-10-CM

## 2020-07-22 NOTE — Telephone Encounter (Signed)
Pt states he has noticed increased swelling and weight gain over the last 2 weeks.  Weight today was 230lbs and it normally runs 219-223lbs.  Swelling in legs has worsened.  Has been taking Furosemide 23m QD for about a month.  States only change he has had is he did start Chemo for his multiple myeloma.  Is on Cytoxan/Valcade and Dexamethasone.  Advised I will send to Dr. STamala Julianfor review and advisement.  Last Creatinine was 2.13 and K+ was 3.8 on 07/16/20.  Pt was not audibly SOB on the phone and was able to carry a conversation with no issues.

## 2020-07-22 NOTE — Telephone Encounter (Signed)
Patient requesting a call back. He states when he last spoke with Dr. Tamala Julian he was told to call the nurse.

## 2020-07-22 NOTE — Telephone Encounter (Signed)
Left message to call back  

## 2020-07-22 NOTE — Telephone Encounter (Signed)
Eric [name] is returning Jennifer's call. Please advise.

## 2020-07-23 ENCOUNTER — Inpatient Hospital Stay: Payer: Medicare Other

## 2020-07-23 ENCOUNTER — Other Ambulatory Visit: Payer: Self-pay

## 2020-07-23 VITALS — BP 145/81 | HR 71 | Temp 97.7°F | Resp 18

## 2020-07-23 DIAGNOSIS — C9000 Multiple myeloma not having achieved remission: Secondary | ICD-10-CM

## 2020-07-23 DIAGNOSIS — Z5112 Encounter for antineoplastic immunotherapy: Secondary | ICD-10-CM | POA: Diagnosis not present

## 2020-07-23 LAB — COMPREHENSIVE METABOLIC PANEL
ALT: 33 U/L (ref 0–44)
AST: 31 U/L (ref 15–41)
Albumin: 4 g/dL (ref 3.5–5.0)
Alkaline Phosphatase: 71 U/L (ref 38–126)
Anion gap: 7 (ref 5–15)
BUN: 36 mg/dL — ABNORMAL HIGH (ref 8–23)
CO2: 28 mmol/L (ref 22–32)
Calcium: 9.4 mg/dL (ref 8.9–10.3)
Chloride: 106 mmol/L (ref 98–111)
Creatinine, Ser: 2 mg/dL — ABNORMAL HIGH (ref 0.61–1.24)
GFR, Estimated: 36 mL/min — ABNORMAL LOW (ref >60)
Glucose, Bld: 163 mg/dL — ABNORMAL HIGH (ref 70–99)
Potassium: 4.3 mmol/L (ref 3.5–5.1)
Sodium: 141 mmol/L (ref 135–145)
Total Bilirubin: 1.1 mg/dL (ref 0.3–1.2)
Total Protein: 6.1 g/dL — ABNORMAL LOW (ref 6.5–8.1)

## 2020-07-23 LAB — CBC WITH DIFFERENTIAL/PLATELET
Abs Immature Granulocytes: 0.02 10*3/uL (ref 0.00–0.07)
Basophils Absolute: 0 10*3/uL (ref 0.0–0.1)
Basophils Relative: 1 %
Eosinophils Absolute: 0.1 10*3/uL (ref 0.0–0.5)
Eosinophils Relative: 2 %
HCT: 33.1 % — ABNORMAL LOW (ref 39.0–52.0)
Hemoglobin: 10.8 g/dL — ABNORMAL LOW (ref 13.0–17.0)
Immature Granulocytes: 0 %
Lymphocytes Relative: 8 %
Lymphs Abs: 0.5 10*3/uL — ABNORMAL LOW (ref 0.7–4.0)
MCH: 29.4 pg (ref 26.0–34.0)
MCHC: 32.6 g/dL (ref 30.0–36.0)
MCV: 90.2 fL (ref 80.0–100.0)
Monocytes Absolute: 0.8 10*3/uL (ref 0.1–1.0)
Monocytes Relative: 13 %
Neutro Abs: 4.7 10*3/uL (ref 1.7–7.7)
Neutrophils Relative %: 76 %
Platelets: 120 10*3/uL — ABNORMAL LOW (ref 150–400)
RBC: 3.67 MIL/uL — ABNORMAL LOW (ref 4.22–5.81)
RDW: 17.7 % — ABNORMAL HIGH (ref 11.5–15.5)
WBC: 6.2 10*3/uL (ref 4.0–10.5)
nRBC: 0.3 % — ABNORMAL HIGH (ref 0.0–0.2)

## 2020-07-23 MED ORDER — SODIUM CHLORIDE 0.9 % IV SOLN
400.0000 mg/m2 | Freq: Once | INTRAVENOUS | Status: AC
  Administered 2020-07-23: 900 mg via INTRAVENOUS
  Filled 2020-07-23: qty 45

## 2020-07-23 MED ORDER — FUROSEMIDE 80 MG PO TABS: 80.0000 mg | ORAL_TABLET | Freq: Every day | ORAL | 3 refills | Status: DC

## 2020-07-23 MED ORDER — POTASSIUM CHLORIDE 20 MEQ/15ML (10%) PO SOLN: 20.0000 meq | Freq: Two times a day (BID) | ORAL | 6 refills | Status: DC

## 2020-07-23 MED ORDER — SODIUM CHLORIDE 0.9 % IV SOLN
20.0000 mg | Freq: Once | INTRAVENOUS | Status: AC
  Administered 2020-07-23: 20 mg via INTRAVENOUS
  Filled 2020-07-23: qty 20

## 2020-07-23 MED ORDER — SODIUM CHLORIDE 0.9 % IV SOLN
Freq: Once | INTRAVENOUS | Status: AC
  Filled 2020-07-23: qty 250

## 2020-07-23 MED ORDER — BORTEZOMIB CHEMO SQ INJECTION 3.5 MG (2.5MG/ML)
1.3000 mg/m2 | Freq: Once | INTRAMUSCULAR | Status: AC
  Administered 2020-07-23: 3 mg via SUBCUTANEOUS
  Filled 2020-07-23: qty 1.2

## 2020-07-23 NOTE — Progress Notes (Signed)
Reviewed pt labs with Dr. Marin Olp and pt ok to treat with creatinine 2.0

## 2020-07-23 NOTE — Telephone Encounter (Signed)
Increase furosemide to 60 mg twice daily to decrease volume overload.  This may take 3 to 5 days.  Then resume furosemide once per day, but increased to 80 mg instead of 60.  Increase potassium to 20 mEq twice daily.  Bmet in 7 to 10 days.  Monitor weight and blood pressure closely as these parameters may cause [place] to alter the above plan if volume clears quickly.

## 2020-07-23 NOTE — Patient Instructions (Signed)
[facility] Discharge Instructions for Patients Receiving Chemotherapy  Today you received the following chemotherapy agents Cytoxan/Velcade   To help prevent nausea and vomiting after your treatment, we encourage you to take your nausea medication as prescribed.   If you develop nausea and vomiting that is not controlled by your nausea medication, call the clinic.   BELOW ARE SYMPTOMS THAT SHOULD BE REPORTED IMMEDIATELY:  *FEVER GREATER THAN 100.5 F  *CHILLS WITH OR WITHOUT FEVER  NAUSEA AND VOMITING THAT IS NOT CONTROLLED WITH YOUR NAUSEA MEDICATION  *UNUSUAL SHORTNESS OF BREATH  *UNUSUAL BRUISING OR BLEEDING  TENDERNESS IN MOUTH AND THROAT WITH OR WITHOUT PRESENCE OF ULCERS  *URINARY PROBLEMS  *BOWEL PROBLEMS  UNUSUAL RASH Items with * indicate a potential emergency and should be followed up as soon as possible.  Feel free to call the clinic should you have any questions or concerns. The clinic phone number is [phone].  Please show the [place] at check-in to the Emergency Department and triage nurse.

## 2020-07-23 NOTE — Telephone Encounter (Signed)
Spoke with pt and reviewed recommendations per Dr. Tamala Julian. Pt agreeable to plan.  He will come for labs on 07/30/2020.

## 2020-07-30 ENCOUNTER — Other Ambulatory Visit: Payer: Self-pay

## 2020-07-30 ENCOUNTER — Other Ambulatory Visit: Payer: Medicare Other | Admitting: *Deleted

## 2020-07-30 DIAGNOSIS — I5022 Chronic systolic (congestive) heart failure: Secondary | ICD-10-CM

## 2020-07-30 DIAGNOSIS — N184 Chronic kidney disease, stage 4 (severe): Secondary | ICD-10-CM

## 2020-07-31 LAB — BASIC METABOLIC PANEL
BUN/Creatinine Ratio: 15 (ref 10–24)
BUN: 28 mg/dL — ABNORMAL HIGH (ref 8–27)
CO2: 24 mmol/L (ref 20–29)
Calcium: 8.7 mg/dL (ref 8.6–10.2)
Chloride: 109 mmol/L — ABNORMAL HIGH (ref 96–106)
Creatinine, Ser: 1.84 mg/dL — ABNORMAL HIGH (ref 0.76–1.27)
GFR calc Af Amer: 43 mL/min/{1.73_m2} — ABNORMAL LOW (ref >59)
GFR calc non Af Amer: 37 mL/min/{1.73_m2} — ABNORMAL LOW (ref >59)
Glucose: 105 mg/dL — ABNORMAL HIGH (ref 65–99)
Potassium: 3.9 mmol/L (ref 3.5–5.2)
Sodium: 146 mmol/L — ABNORMAL HIGH (ref 134–144)

## 2020-08-03 ENCOUNTER — Encounter: Payer: Self-pay | Admitting: Podiatry

## 2020-08-03 ENCOUNTER — Other Ambulatory Visit: Payer: Self-pay

## 2020-08-03 ENCOUNTER — Ambulatory Visit (INDEPENDENT_AMBULATORY_CARE_PROVIDER_SITE_OTHER): Payer: Medicare Other | Admitting: Podiatry

## 2020-08-03 DIAGNOSIS — D689 Coagulation defect, unspecified: Secondary | ICD-10-CM

## 2020-08-03 DIAGNOSIS — B351 Tinea unguium: Secondary | ICD-10-CM

## 2020-08-03 DIAGNOSIS — M79674 Pain in right toe(s): Secondary | ICD-10-CM | POA: Diagnosis not present

## 2020-08-03 DIAGNOSIS — M79675 Pain in left toe(s): Secondary | ICD-10-CM | POA: Diagnosis not present

## 2020-08-03 DIAGNOSIS — L608 Other nail disorders: Secondary | ICD-10-CM

## 2020-08-03 DIAGNOSIS — E119 Type 2 diabetes mellitus without complications: Secondary | ICD-10-CM

## 2020-08-03 NOTE — Progress Notes (Signed)
This patient returns to my office for at risk foot care.  This patient requires this care by a professional since this patient will be at risk due to having diabetes type 2, chronic kidney disease and coagulation defect.  Patient is taking eliquisThis patient is unable to cut nails himself since the patient cannot reach his nails.These nails are painful walking and wearing shoes.  This patient presents for at risk foot care today.  General Appearance  Alert, conversant and in no acute stress.  Vascular  Dorsalis pedis and posterior tibial  pulses are palpable  bilaterally.  Capillary return is within normal limits  bilaterally. Temperature is within normal limits  bilaterally.  Neurologic  Senn-Weinstein monofilament wire test within normal limits  bilaterally. Muscle power within normal limits bilaterally.  Nails Thick disfigured discolored nails with subungual debris  from hallux to fifth toes bilaterally. No evidence of bacterial infection or drainage bilaterally.  Orthopedic  No limitations of motion  feet .  No crepitus or effusions noted.  No bony pathology or digital deformities noted.  Skin  normotropic skin with no porokeratosis noted bilaterally.  No signs of infections or ulcers noted.     Onychomycosis  Pain in right toes  Pain in left toes  Consent was obtained for treatment procedures.   Mechanical debridement of nails 1-5  bilaterally performed with a nail nipper.  Filed with dremel without incident.    Return office visit   10 weeks                   Told patient to return for periodic foot care and evaluation due to potential at risk complications.   Eean Buss DPM  

## 2020-08-04 ENCOUNTER — Inpatient Hospital Stay: Payer: Medicare Other | Attending: Hematology & Oncology

## 2020-08-04 ENCOUNTER — Inpatient Hospital Stay: Payer: Medicare Other

## 2020-08-04 ENCOUNTER — Inpatient Hospital Stay: Payer: Medicare Other | Admitting: Hematology & Oncology

## 2020-08-04 DIAGNOSIS — N189 Chronic kidney disease, unspecified: Secondary | ICD-10-CM | POA: Insufficient documentation

## 2020-08-04 DIAGNOSIS — Z79899 Other long term (current) drug therapy: Secondary | ICD-10-CM | POA: Insufficient documentation

## 2020-08-04 DIAGNOSIS — Z5111 Encounter for antineoplastic chemotherapy: Secondary | ICD-10-CM | POA: Insufficient documentation

## 2020-08-04 DIAGNOSIS — C9000 Multiple myeloma not having achieved remission: Secondary | ICD-10-CM | POA: Insufficient documentation

## 2020-08-04 DIAGNOSIS — D649 Anemia, unspecified: Secondary | ICD-10-CM | POA: Insufficient documentation

## 2020-08-04 DIAGNOSIS — Z5112 Encounter for antineoplastic immunotherapy: Secondary | ICD-10-CM | POA: Insufficient documentation

## 2020-08-04 DIAGNOSIS — R197 Diarrhea, unspecified: Secondary | ICD-10-CM | POA: Insufficient documentation

## 2020-08-04 DIAGNOSIS — Z7901 Long term (current) use of anticoagulants: Secondary | ICD-10-CM | POA: Insufficient documentation

## 2020-08-06 ENCOUNTER — Ambulatory Visit: Payer: Medicare Other

## 2020-08-06 ENCOUNTER — Inpatient Hospital Stay: Payer: Medicare Other

## 2020-08-06 ENCOUNTER — Other Ambulatory Visit: Payer: Medicare Other

## 2020-08-06 ENCOUNTER — Other Ambulatory Visit: Payer: Self-pay

## 2020-08-06 VITALS — BP 131/88 | HR 58 | Temp 98.8°F | Resp 18

## 2020-08-06 DIAGNOSIS — Z7901 Long term (current) use of anticoagulants: Secondary | ICD-10-CM | POA: Diagnosis not present

## 2020-08-06 DIAGNOSIS — C9000 Multiple myeloma not having achieved remission: Secondary | ICD-10-CM

## 2020-08-06 DIAGNOSIS — Z5112 Encounter for antineoplastic immunotherapy: Secondary | ICD-10-CM | POA: Diagnosis present

## 2020-08-06 DIAGNOSIS — N189 Chronic kidney disease, unspecified: Secondary | ICD-10-CM | POA: Diagnosis not present

## 2020-08-06 DIAGNOSIS — Z79899 Other long term (current) drug therapy: Secondary | ICD-10-CM | POA: Diagnosis not present

## 2020-08-06 DIAGNOSIS — Z5111 Encounter for antineoplastic chemotherapy: Secondary | ICD-10-CM | POA: Diagnosis present

## 2020-08-06 DIAGNOSIS — D649 Anemia, unspecified: Secondary | ICD-10-CM | POA: Diagnosis not present

## 2020-08-06 DIAGNOSIS — R197 Diarrhea, unspecified: Secondary | ICD-10-CM | POA: Diagnosis not present

## 2020-08-06 LAB — CBC WITH DIFFERENTIAL/PLATELET
Abs Immature Granulocytes: 0.01 10*3/uL (ref 0.00–0.07)
Basophils Absolute: 0 10*3/uL (ref 0.0–0.1)
Basophils Relative: 1 %
Eosinophils Absolute: 0.1 10*3/uL (ref 0.0–0.5)
Eosinophils Relative: 1 %
HCT: 32 % — ABNORMAL LOW (ref 39.0–52.0)
Hemoglobin: 10.2 g/dL — ABNORMAL LOW (ref 13.0–17.0)
Immature Granulocytes: 0 %
Lymphocytes Relative: 16 %
Lymphs Abs: 0.7 10*3/uL (ref 0.7–4.0)
MCH: 28.6 pg (ref 26.0–34.0)
MCHC: 31.9 g/dL (ref 30.0–36.0)
MCV: 89.6 fL (ref 80.0–100.0)
Monocytes Absolute: 0.9 10*3/uL (ref 0.1–1.0)
Monocytes Relative: 22 %
Neutro Abs: 2.6 10*3/uL (ref 1.7–7.7)
Neutrophils Relative %: 60 %
Platelets: 207 10*3/uL (ref 150–400)
RBC: 3.57 MIL/uL — ABNORMAL LOW (ref 4.22–5.81)
RDW: 18.5 % — ABNORMAL HIGH (ref 11.5–15.5)
WBC: 4.3 10*3/uL (ref 4.0–10.5)
nRBC: 0 % (ref 0.0–0.2)

## 2020-08-06 LAB — COMPREHENSIVE METABOLIC PANEL
ALT: 26 U/L (ref 0–44)
AST: 27 U/L (ref 15–41)
Albumin: 3.8 g/dL (ref 3.5–5.0)
Alkaline Phosphatase: 71 U/L (ref 38–126)
Anion gap: 9 (ref 5–15)
BUN: 26 mg/dL — ABNORMAL HIGH (ref 8–23)
CO2: 26 mmol/L (ref 22–32)
Calcium: 9 mg/dL (ref 8.9–10.3)
Chloride: 107 mmol/L (ref 98–111)
Creatinine, Ser: 1.88 mg/dL — ABNORMAL HIGH (ref 0.61–1.24)
GFR, Estimated: 38 mL/min — ABNORMAL LOW (ref >60)
Glucose, Bld: 107 mg/dL — ABNORMAL HIGH (ref 70–99)
Potassium: 3.9 mmol/L (ref 3.5–5.1)
Sodium: 142 mmol/L (ref 135–145)
Total Bilirubin: 0.7 mg/dL (ref 0.3–1.2)
Total Protein: 5.6 g/dL — ABNORMAL LOW (ref 6.5–8.1)

## 2020-08-06 MED ORDER — BORTEZOMIB CHEMO SQ INJECTION 3.5 MG (2.5MG/ML)
1.3000 mg/m2 | Freq: Once | INTRAMUSCULAR | Status: AC
  Administered 2020-08-06: 3 mg via SUBCUTANEOUS
  Filled 2020-08-06: qty 1.2

## 2020-08-06 MED ORDER — SODIUM CHLORIDE 0.9 % IV SOLN
20.0000 mg | Freq: Once | INTRAVENOUS | Status: AC
  Administered 2020-08-06: 20 mg via INTRAVENOUS
  Filled 2020-08-06: qty 2

## 2020-08-06 MED ORDER — SODIUM CHLORIDE 0.9 % IV SOLN
400.0000 mg/m2 | Freq: Once | INTRAVENOUS | Status: AC
  Administered 2020-08-06: 900 mg via INTRAVENOUS
  Filled 2020-08-06: qty 45

## 2020-08-06 MED ORDER — SODIUM CHLORIDE 0.9 % IV SOLN
Freq: Once | INTRAVENOUS | Status: AC
  Filled 2020-08-06: qty 250

## 2020-08-06 MED ORDER — PALONOSETRON HCL INJECTION 0.25 MG/5ML
INTRAVENOUS | Status: AC
  Filled 2020-08-06: qty 5

## 2020-08-06 MED ORDER — PALONOSETRON HCL INJECTION 0.25 MG/5ML
0.2500 mg | Freq: Once | INTRAVENOUS | Status: AC
  Administered 2020-08-06: 0.25 mg via INTRAVENOUS

## 2020-08-06 NOTE — Progress Notes (Signed)
Ok to treat with creatinine 1.8 per Dr. Marin Olp.

## 2020-08-06 NOTE — Patient Instructions (Signed)
[facility] Discharge Instructions for Patients Receiving Chemotherapy  Today you received the following chemotherapy agents Cytoxan/Velcade To help prevent nausea and vomiting after your treatment, we encourage you to take your nausea medication as prescribed.  If you develop nausea and vomiting that is not controlled by your nausea medication, call the clinic.   BELOW ARE SYMPTOMS THAT SHOULD BE REPORTED IMMEDIATELY:  *FEVER GREATER THAN 100.5 F  *CHILLS WITH OR WITHOUT FEVER  NAUSEA AND VOMITING THAT IS NOT CONTROLLED WITH YOUR NAUSEA MEDICATION  *UNUSUAL SHORTNESS OF BREATH  *UNUSUAL BRUISING OR BLEEDING  TENDERNESS IN MOUTH AND THROAT WITH OR WITHOUT PRESENCE OF ULCERS  *URINARY PROBLEMS  *BOWEL PROBLEMS  UNUSUAL RASH Items with * indicate a potential emergency and should be followed up as soon as possible.  Feel free to call the clinic should you have any questions or concerns. The clinic phone number is [phone].  Please show the [facility] at check-in to the Emergency Department and triage nurse.

## 2020-08-06 NOTE — Progress Notes (Signed)
Pt discharged in no apparent distress. Pt left ambulatory without assistance. Pt aware of discharge instructions and verbalized understanding and had no further questions.  

## 2020-08-11 ENCOUNTER — Other Ambulatory Visit: Payer: Self-pay | Admitting: Internal Medicine

## 2020-08-11 ENCOUNTER — Inpatient Hospital Stay: Payer: Medicare Other

## 2020-08-11 ENCOUNTER — Inpatient Hospital Stay (HOSPITAL_BASED_OUTPATIENT_CLINIC_OR_DEPARTMENT_OTHER): Payer: Medicare Other | Admitting: Hematology & Oncology

## 2020-08-11 ENCOUNTER — Other Ambulatory Visit: Payer: Self-pay

## 2020-08-11 ENCOUNTER — Encounter: Payer: Self-pay | Admitting: *Deleted

## 2020-08-11 ENCOUNTER — Telehealth: Payer: Self-pay

## 2020-08-11 ENCOUNTER — Encounter: Payer: Self-pay | Admitting: Hematology & Oncology

## 2020-08-11 VITALS — BP 108/61 | HR 81 | Temp 98.9°F | Resp 20 | Wt 230.1 lb

## 2020-08-11 DIAGNOSIS — C9000 Multiple myeloma not having achieved remission: Secondary | ICD-10-CM

## 2020-08-11 DIAGNOSIS — Z5112 Encounter for antineoplastic immunotherapy: Secondary | ICD-10-CM | POA: Diagnosis not present

## 2020-08-11 LAB — CBC WITH DIFFERENTIAL (CANCER CENTER ONLY)
Abs Immature Granulocytes: 0.04 10*3/uL (ref 0.00–0.07)
Basophils Absolute: 0 10*3/uL (ref 0.0–0.1)
Basophils Relative: 1 %
Eosinophils Absolute: 0 10*3/uL (ref 0.0–0.5)
Eosinophils Relative: 1 %
HCT: 32.5 % — ABNORMAL LOW (ref 39.0–52.0)
Hemoglobin: 10.4 g/dL — ABNORMAL LOW (ref 13.0–17.0)
Immature Granulocytes: 1 %
Lymphocytes Relative: 13 %
Lymphs Abs: 0.5 10*3/uL — ABNORMAL LOW (ref 0.7–4.0)
MCH: 28.7 pg (ref 26.0–34.0)
MCHC: 32 g/dL (ref 30.0–36.0)
MCV: 89.8 fL (ref 80.0–100.0)
Monocytes Absolute: 0.9 10*3/uL (ref 0.1–1.0)
Monocytes Relative: 25 %
Neutro Abs: 2.1 10*3/uL (ref 1.7–7.7)
Neutrophils Relative %: 59 %
Platelet Count: 144 10*3/uL — ABNORMAL LOW (ref 150–400)
RBC: 3.62 MIL/uL — ABNORMAL LOW (ref 4.22–5.81)
RDW: 18 % — ABNORMAL HIGH (ref 11.5–15.5)
WBC Count: 3.6 10*3/uL — ABNORMAL LOW (ref 4.0–10.5)
nRBC: 1.7 % — ABNORMAL HIGH (ref 0.0–0.2)

## 2020-08-11 LAB — RETICULOCYTES
Immature Retic Fract: 16.3 % — ABNORMAL HIGH (ref 2.3–15.9)
RBC.: 3.61 MIL/uL — ABNORMAL LOW (ref 4.22–5.81)
Retic Count, Absolute: 15.5 10*3/uL — ABNORMAL LOW (ref 19.0–186.0)
Retic Ct Pct: 0.4 % (ref 0.4–3.1)

## 2020-08-11 LAB — CMP (CANCER CENTER ONLY)
ALT: 28 U/L (ref 0–44)
AST: 28 U/L (ref 15–41)
Albumin: 3.9 g/dL (ref 3.5–5.0)
Alkaline Phosphatase: 68 U/L (ref 38–126)
Anion gap: 7 (ref 5–15)
BUN: 37 mg/dL — ABNORMAL HIGH (ref 8–23)
CO2: 28 mmol/L (ref 22–32)
Calcium: 9 mg/dL (ref 8.9–10.3)
Chloride: 105 mmol/L (ref 98–111)
Creatinine: 1.91 mg/dL — ABNORMAL HIGH (ref 0.61–1.24)
GFR, Estimated: 38 mL/min — ABNORMAL LOW (ref >60)
Glucose, Bld: 175 mg/dL — ABNORMAL HIGH (ref 70–99)
Potassium: 4 mmol/L (ref 3.5–5.1)
Sodium: 140 mmol/L (ref 135–145)
Total Bilirubin: 0.8 mg/dL (ref 0.3–1.2)
Total Protein: 5.8 g/dL — ABNORMAL LOW (ref 6.5–8.1)

## 2020-08-11 LAB — LACTATE DEHYDROGENASE: LDH: 222 U/L — ABNORMAL HIGH (ref 98–192)

## 2020-08-11 MED ORDER — PALONOSETRON HCL INJECTION 0.25 MG/5ML
0.2500 mg | Freq: Once | INTRAVENOUS | Status: AC
  Administered 2020-08-11: 0.25 mg via INTRAVENOUS

## 2020-08-11 MED ORDER — SODIUM CHLORIDE 0.9 % IV SOLN
400.0000 mg/m2 | Freq: Once | INTRAVENOUS | Status: AC
  Administered 2020-08-11: 900 mg via INTRAVENOUS
  Filled 2020-08-11: qty 45

## 2020-08-11 MED ORDER — SODIUM CHLORIDE 0.9 % IV SOLN
Freq: Once | INTRAVENOUS | Status: AC
  Filled 2020-08-11: qty 250

## 2020-08-11 MED ORDER — BORTEZOMIB CHEMO SQ INJECTION 3.5 MG (2.5MG/ML)
1.3000 mg/m2 | Freq: Once | INTRAMUSCULAR | Status: AC
  Administered 2020-08-11: 3 mg via SUBCUTANEOUS
  Filled 2020-08-11: qty 1.2

## 2020-08-11 MED ORDER — PALONOSETRON HCL INJECTION 0.25 MG/5ML
INTRAVENOUS | Status: AC
  Filled 2020-08-11: qty 5

## 2020-08-11 MED ORDER — SODIUM CHLORIDE 0.9 % IV SOLN
20.0000 mg | Freq: Once | INTRAVENOUS | Status: AC
  Administered 2020-08-11: 20 mg via INTRAVENOUS
  Filled 2020-08-11: qty 20

## 2020-08-11 NOTE — Telephone Encounter (Signed)
Called and km with appts per 08/11/20 los.... AOM

## 2020-08-11 NOTE — Progress Notes (Signed)
Oncology Nurse Navigator Documentation  Oncology Nurse Navigator Flowsheets 08/11/2020  Abnormal Finding Date -  Confirmed Diagnosis Date -  Diagnosis Status -  Planned Course of Treatment -  Phase of Treatment -  Chemotherapy Pending- Reason: -  Chemotherapy Actual Start Date: -  Navigator Follow Up Date: 09/01/2020  Navigator Follow Up Reason: Follow-up Appointment  Navigator Location [facility]  Referral Date to RadOnc/MedOnc -  Navigator Encounter Type Treatment;Appt/Treatment Plan Review  Telephone -  Treatment Initiated Date -  Patient Visit Type MedOnc  Treatment Phase Active Tx  Barriers/Navigation Needs Coordination of Care;Education;Employed  Education -  Interventions Psycho-Social Support  Acuity Level 2-Minimal Needs (1-2 Barriers Identified)  Coordination of Care -  Education Method -  Support Groups/Services Friends and Family  Time Spent with Patient 15

## 2020-08-11 NOTE — Progress Notes (Signed)
Pt discharged in no apparent distress. Pt left ambulatory without assistance. Pt aware of discharge instructions and verbalized understanding and had no further questions.  

## 2020-08-11 NOTE — Progress Notes (Signed)
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  He is not be able to come in a couple times because of his workload.  We have not yet checked a monoclonal protein on him since we first saw him.  We will have to get one today.  Overall, he is tolerating chemotherapy nicely.  He has had no hair loss.  He has had no neuropathy in his fingers or toes.  He has had some hyperpigmentation where he has had the Velcade injections.  He said a little bit of diarrhea after the Velcade.  Takes Imodium for this.  There is been no problems with fever.  He has had no cough or shortness of breath.  He does have chronic renal insufficiency.  He has mild anemia from this.  We do not have him on Aranesp as of yet.  I will try to hold off on this.  He will have a nice Thanksgiving at home with his family.   Currently, his performance status is ECOG 1.  Medications:  Current Outpatient Medications:  .  acetaminophen (TYLENOL) 500 MG tablet, Take 1,000 mg by mouth every 4 (four) hours as needed for moderate pain or headache. , Disp: , Rfl:  .  amiodarone (PACERONE) 100 MG tablet, Take 1 tablet (100 mg total) by mouth daily., Disp: 90 tablet, Rfl: 3 .  carvedilol (COREG) 6.25 MG tablet, TAKE 1 TABLET(6.25 MG) BY MOUTH TWICE DAILY, Disp: 60 tablet, Rfl: 0 .  ELIQUIS 5 MG TABS tablet, TAKE 1 TABLET BY MOUTH TWICE DAILY., Disp: 60 tablet, Rfl: 0 .  fluticasone (FLONASE) 50 MCG/ACT nasal spray, Place 2 sprays into the nose as needed for allergies. , Disp: , Rfl:  .  furosemide (LASIX) 80 MG tablet, Take 1 tablet (80 mg total) by mouth daily., Disp: 90 tablet, Rfl: 3 .  Hypromellose (ARTIFICIAL TEARS  OP), Place 1 drop into both eyes daily as needed (dry eyes)., Disp: , Rfl:  .  isosorbide-hydrALAZINE (BIDIL) 20-37.5 MG tablet, Take 1 tablet by mouth 3 (three) times daily., Disp: 270 tablet, Rfl: 3 .  levothyroxine (SYNTHROID) 75 MCG tablet, TAKE 1 TABLET BY MOUTH DAILY IN THE MORNING EXCEPT TAKE 1/2 TABLET ON SUNDAYS IN THE MORNING, Disp: 90 tablet, Rfl: 0 .  loperamide (IMODIUM) 2 MG capsule, Take 2 mg by mouth as needed for diarrhea or loose stools., Disp: , Rfl:  .  potassium chloride 20 MEQ/15ML (10%) SOLN, Take 15 mLs (20 mEq total) by mouth 2 (two) times daily. (Patient taking differently: Take 20 mEq by mouth daily. ), Disp: 946 mL, Rfl: 6 .  prochlorperazine (COMPAZINE) 10 MG tablet, Take 1 tablet (10 mg total) by mouth every 6 (six) hours as needed (Nausea or vomiting)., Disp: 30 tablet, Rfl: 1 .  rosuvastatin (CRESTOR) 20 MG tablet, Take 1 tablet (20 mg total) by mouth daily., Disp: 90 tablet, Rfl: 2 .  vitamin B-12 (CYANOCOBALAMIN) 100 MCG tablet, Take 100 mcg by mouth daily. , Disp: , Rfl:  .  vitamin C (ASCORBIC ACID) 500 MG tablet, Take 500 mg by mouth as needed. , Disp: , Rfl:  .  VITAMIN D PO, Take 1 capsule by mouth daily., Disp: , Rfl:  .  docusate sodium (COLACE) 100 MG capsule, Take 100 mg by mouth daily as needed for mild constipation. (Patient not taking: Reported on 08/11/2020), Disp: , Rfl:  .  famciclovir (FAMVIR) 500 MG tablet, Take 1 tablet (500 mg total) by mouth daily. (Patient not taking: Reported on 08/11/2020), Disp: 30 tablet, Rfl: 3 .  ipratropium (ATROVENT) 0.06 % nasal spray, Place 2 sprays into both nostrils 2 (two) times daily. (Patient not taking: Reported on 08/11/2020), Disp: 15 mL, Rfl: 3 .  ondansetron (ZOFRAN) 8 MG tablet, Take 1 tablet (8 mg total) by mouth 2 (two) times daily as needed for refractory nausea / vomiting. Start on day 3 after Cytoxan. (Patient not taking: Reported on 08/11/2020), Disp: 30 tablet, Rfl: 1 No current facility-administered  medications for this visit.  Facility-Administered Medications Ordered in Other Visits:  .  bortezomib SQ (VELCADE) chemo injection (2.5mg /mL concentration) 3 mg, 1.3 mg/m2 (Treatment Plan Recorded), Subcutaneous, Once, Eric [name], Eric Cobb, MD .  cyclophosphamide (CYTOXAN) 900 mg in sodium chloride 0.9 % 250 mL chemo infusion, 400 mg/m2 (Treatment Plan Recorded), Intravenous, Once, Eric [name], Eric Cobb, MD .  dexamethasone (DECADRON) 20 mg in sodium chloride 0.9 % 50 mL IVPB, 20 mg, Intravenous, Once, Eric Napoleon, MD, Last Rate: 208 mL/hr at 08/11/20 1418, 20 mg at 08/11/20 1418  Allergies:  Allergies  Allergen Reactions  . Lactose Intolerance (Gi) Diarrhea    Past Medical History, Surgical history, Social history, and Family History were reviewed and updated.  Review of Systems: Review of Systems  Constitutional: Negative.   HENT:  Negative.   Eyes: Negative.   Respiratory: Negative.   Cardiovascular: Negative.   Gastrointestinal: Negative.   Endocrine: Negative.   Genitourinary: Negative.    Musculoskeletal: Negative.   Skin: Negative.   Neurological: Negative.   Hematological: Negative.   Psychiatric/Behavioral: Negative.     Physical Exam:  weight is 230 lb 1.9 oz (104.4 kg). His oral temperature is 98.9 F (37.2 C). His blood pressure is 108/61 and his pulse is 81. His respiration is 20 and oxygen saturation is 100%.   Wt Readings from Last 3 Encounters:  08/11/20 230 lb 1.9 oz (104.4 kg)  07/09/20 233 lb (105.7 kg)  05/26/20 228 lb 12 oz (103.8 kg)    Physical Exam Vitals reviewed.  HENT:     Head: Normocephalic and atraumatic.  Eyes:     Pupils: Pupils are equal, round, and reactive to light.  Cardiovascular:     Rate and Rhythm: Normal rate and regular rhythm.     Heart sounds: Normal heart sounds.  Pulmonary:     Effort: Pulmonary effort is normal.     Breath sounds: Normal breath sounds.  Abdominal:     General: Bowel sounds are normal.     Palpations:  Abdomen is soft.  Musculoskeletal:        General: No tenderness or deformity. Normal range of motion.     Cervical back: Normal range of motion.  Lymphadenopathy:     Cervical: No cervical adenopathy.  Skin:    General: Skin is warm and dry.     Findings: No erythema or rash.  Neurological:     Mental Status: He is alert and oriented to person, place, and time.  Psychiatric:        Behavior: Behavior normal.        Thought Content: Thought content normal.  Judgment: Judgment normal.      Lab Results  Component Value Date   WBC 3.6 (L) 08/11/2020   HGB 10.4 (L) 08/11/2020   HCT 32.5 (L) 08/11/2020   MCV 89.8 08/11/2020   PLT 144 (L) 08/11/2020     Chemistry      Component Value Date/Time   NA 140 08/11/2020 1314   NA 146 (H) 07/30/2020 1555   K 4.0 08/11/2020 1314   CL 105 08/11/2020 1314   CO2 28 08/11/2020 1314   BUN 37 (H) 08/11/2020 1314   BUN 28 (H) 07/30/2020 1555   CREATININE 1.91 (H) 08/11/2020 1314   CREATININE 1.95 (H) 09/08/2016 0859      Component Value Date/Time   CALCIUM 9.0 08/11/2020 1314   ALKPHOS 68 08/11/2020 1314   AST 28 08/11/2020 1314   ALT 28 08/11/2020 1314   BILITOT 0.8 08/11/2020 1314      Impression and Plan: Mr. Laforest is a very nice 69 year old African-American male.  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  He has IgG kappa myeloma.  I do not think that he has bad cytogenetics or FISH.  There is been no problems with the treatment.  Again I think is working nicely.  We will see what his myeloma studies look like.  We will plan for a third cycle to start in December.  This is his completion of cycle #2.    I will plan to get him back here in another 3 to 4 weeks.   Eric Napoleon, MD 11/17/20212:18 PM

## 2020-08-11 NOTE — Progress Notes (Signed)
Reviewed labwork with Dr Marin Olp.  Ok to treat with Creatinine 1.9.

## 2020-08-11 NOTE — Patient Instructions (Signed)
[facility] Discharge Instructions for Patients Receiving Chemotherapy  Today you received the following chemotherapy agents Cytoxan, Velcade  To help prevent nausea and vomiting after your treatment, we encourage you to take your nausea medication    If you develop nausea and vomiting that is not controlled by your nausea medication, call the clinic.   BELOW ARE SYMPTOMS THAT SHOULD BE REPORTED IMMEDIATELY:  *FEVER GREATER THAN 100.5 F  *CHILLS WITH OR WITHOUT FEVER  NAUSEA AND VOMITING THAT IS NOT CONTROLLED WITH YOUR NAUSEA MEDICATION  *UNUSUAL SHORTNESS OF BREATH  *UNUSUAL BRUISING OR BLEEDING  TENDERNESS IN MOUTH AND THROAT WITH OR WITHOUT PRESENCE OF ULCERS  *URINARY PROBLEMS  *BOWEL PROBLEMS  UNUSUAL RASH Items with * indicate a potential emergency and should be followed up as soon as possible.  Feel free to call the clinic should you have any questions or concerns. The clinic phone number is [phone].  Please show the [place] at check-in to the Emergency Department and triage nurse.

## 2020-08-12 ENCOUNTER — Other Ambulatory Visit: Payer: Self-pay | Admitting: Internal Medicine

## 2020-08-12 LAB — IGG, IGA, IGM
IgA: 31 mg/dL — ABNORMAL LOW (ref 61–437)
IgG (Immunoglobin G), Serum: 404 mg/dL — ABNORMAL LOW (ref 603–1613)
IgM (Immunoglobulin M), Srm: 14 mg/dL — ABNORMAL LOW (ref 20–172)

## 2020-08-12 LAB — FERRITIN: Ferritin: 77 ng/mL (ref 24–336)

## 2020-08-12 LAB — IRON AND TIBC
Iron: 39 ug/dL — ABNORMAL LOW (ref 42–163)
Saturation Ratios: 10 % — ABNORMAL LOW (ref 20–55)
TIBC: 400 ug/dL (ref 202–409)
UIBC: 361 ug/dL (ref 117–376)

## 2020-08-12 LAB — KAPPA/LAMBDA LIGHT CHAINS
Kappa free light chain: 11.7 mg/L (ref 3.3–19.4)
Kappa, lambda light chain ratio: 1.11 (ref 0.26–1.65)
Lambda free light chains: 10.5 mg/L (ref 5.7–26.3)

## 2020-08-13 ENCOUNTER — Telehealth: Payer: Self-pay

## 2020-08-13 LAB — PROTEIN ELECTROPHORESIS, SERUM, WITH REFLEX
A/G Ratio: 1.6 (ref 0.7–1.7)
Albumin ELP: 3.3 g/dL (ref 2.9–4.4)
Alpha-1-Globulin: 0.3 g/dL (ref 0.0–0.4)
Alpha-2-Globulin: 0.5 g/dL (ref 0.4–1.0)
Beta Globulin: 0.9 g/dL (ref 0.7–1.3)
Gamma Globulin: 0.4 g/dL (ref 0.4–1.8)
Globulin, Total: 2.1 g/dL — ABNORMAL LOW (ref 2.2–3.9)
M-Spike, %: 0.2 g/dL — ABNORMAL HIGH
SPEP Interpretation: 0
Total Protein ELP: 5.4 g/dL — ABNORMAL LOW (ref 6.0–8.5)

## 2020-08-13 LAB — IMMUNOFIXATION REFLEX, SERUM
IgA: 32 mg/dL — ABNORMAL LOW (ref 61–437)
IgG (Immunoglobin G), Serum: 402 mg/dL — ABNORMAL LOW (ref 603–1613)
IgM (Immunoglobulin M), Srm: 15 mg/dL — ABNORMAL LOW (ref 20–172)

## 2020-08-13 NOTE — Telephone Encounter (Signed)
Pt called in to r/s his appts from 12/8 to 12/9 as he has a full pt sch himself... done... AOM

## 2020-08-16 ENCOUNTER — Encounter: Payer: Self-pay | Admitting: *Deleted

## 2020-08-18 ENCOUNTER — Inpatient Hospital Stay: Payer: Medicare Other

## 2020-08-25 ENCOUNTER — Encounter: Payer: Self-pay | Admitting: Internal Medicine

## 2020-08-26 ENCOUNTER — Encounter: Payer: Self-pay | Admitting: Internal Medicine

## 2020-08-26 ENCOUNTER — Ambulatory Visit (INDEPENDENT_AMBULATORY_CARE_PROVIDER_SITE_OTHER): Payer: Medicare Other | Admitting: Internal Medicine

## 2020-08-26 ENCOUNTER — Other Ambulatory Visit: Payer: Self-pay

## 2020-08-26 VITALS — BP 114/76 | HR 70 | Temp 98.4°F | Ht 69.0 in | Wt 232.6 lb

## 2020-08-26 DIAGNOSIS — I129 Hypertensive chronic kidney disease with stage 1 through stage 4 chronic kidney disease, or unspecified chronic kidney disease: Secondary | ICD-10-CM | POA: Diagnosis not present

## 2020-08-26 DIAGNOSIS — E6609 Other obesity due to excess calories: Secondary | ICD-10-CM

## 2020-08-26 DIAGNOSIS — E1122 Type 2 diabetes mellitus with diabetic chronic kidney disease: Secondary | ICD-10-CM | POA: Diagnosis not present

## 2020-08-26 DIAGNOSIS — Z6832 Body mass index (BMI) 32.0-32.9, adult: Secondary | ICD-10-CM

## 2020-08-26 DIAGNOSIS — N183 Chronic kidney disease, stage 3 unspecified: Secondary | ICD-10-CM | POA: Diagnosis not present

## 2020-08-26 DIAGNOSIS — C9000 Multiple myeloma not having achieved remission: Secondary | ICD-10-CM

## 2020-08-26 DIAGNOSIS — E039 Hypothyroidism, unspecified: Secondary | ICD-10-CM

## 2020-08-26 MED ORDER — APIXABAN 5 MG PO TABS
5.0000 mg | ORAL_TABLET | Freq: Two times a day (BID) | ORAL | 5 refills | Status: DC
Start: 2020-08-26 — End: 2022-05-29

## 2020-08-26 MED ORDER — ROSUVASTATIN CALCIUM 20 MG PO TABS
20.0000 mg | ORAL_TABLET | Freq: Every day | ORAL | 2 refills | Status: DC
Start: 2020-08-26 — End: 2022-05-02

## 2020-08-26 NOTE — Patient Instructions (Signed)
Diabetes Mellitus and Foot Care Foot care is an important part of your health, especially when you have diabetes. Diabetes may cause you to have problems because of poor blood flow (circulation) to your feet and legs, which can cause your skin to:  Become thinner and drier.  Break more easily.  Heal more slowly.  Peel and crack. You may also have nerve damage (neuropathy) in your legs and feet, causing decreased feeling in them. This means that you may not notice minor injuries to your feet that could lead to more serious problems. Noticing and addressing any potential problems early is the best way to prevent future foot problems. How to care for your feet Foot hygiene  Wash your feet daily with warm water and mild soap. Do not use hot water. Then, pat your feet and the areas between your toes until they are completely dry. Do not soak your feet as this can dry your skin.  Trim your toenails straight across. Do not dig under them or around the cuticle. File the edges of your nails with an emery board or nail file.  Apply a moisturizing lotion or petroleum jelly to the skin on your feet and to dry, brittle toenails. Use lotion that does not contain alcohol and is unscented. Do not apply lotion between your toes. Shoes and socks  Wear clean socks or stockings every day. Make sure they are not too tight. Do not wear knee-high stockings since they may decrease blood flow to your legs.  Wear shoes that fit properly and have enough cushioning. Always look in your shoes before you put them on to be sure there are no objects inside.  To break in new shoes, wear them for just a few hours a day. This prevents injuries on your feet. Wounds, scrapes, corns, and calluses  Check your feet daily for blisters, cuts, bruises, sores, and redness. If you cannot see the bottom of your feet, use a mirror or ask someone for help.  Do not cut corns or calluses or try to remove them with medicine.  If you  find a minor scrape, cut, or break in the skin on your feet, keep it and the skin around it clean and dry. You may clean these areas with mild soap and water. Do not clean the area with peroxide, alcohol, or iodine.  If you have a wound, scrape, corn, or callus on your foot, look at it several times a day to make sure it is healing and not infected. Check for: ? Redness, swelling, or pain. ? Fluid or blood. ? Warmth. ? Pus or a bad smell. General instructions  Do not cross your legs. This may decrease blood flow to your feet.  Do not use heating pads or hot water bottles on your feet. They may burn your skin. If you have lost feeling in your feet or legs, you may not know this is happening until it is too late.  Protect your feet from hot and cold by wearing shoes, such as at the beach or on hot pavement.  Schedule a complete foot exam at least once a year (annually) or more often if you have foot problems. If you have foot problems, report any cuts, sores, or bruises to your health care provider immediately. Contact a health care provider if:  You have a medical condition that increases your risk of infection and you have any cuts, sores, or bruises on your feet.  You have an injury that is not   healing.  You have redness on your legs or feet.  You feel burning or tingling in your legs or feet.  You have pain or cramps in your legs and feet.  Your legs or feet are numb.  Your feet always feel cold.  You have pain around a toenail. Get help right away if:  You have a wound, scrape, corn, or callus on your foot and: ? You have pain, swelling, or redness that gets worse. ? You have fluid or blood coming from the wound, scrape, corn, or callus. ? Your wound, scrape, corn, or callus feels warm to the touch. ? You have pus or a bad smell coming from the wound, scrape, corn, or callus. ? You have a fever. ? You have a red line going up your leg. Summary  Check your feet every day  for cuts, sores, red spots, swelling, and blisters.  Moisturize feet and legs daily.  Wear shoes that fit properly and have enough cushioning.  If you have foot problems, report any cuts, sores, or bruises to your health care provider immediately.  Schedule a complete foot exam at least once a year (annually) or more often if you have foot problems. This information is not intended to replace advice given to you by your health care provider. Make sure you discuss any questions you have with your health care provider. Document Revised: 06/04/2019 Document Reviewed: 10/13/2016 [employer] Patient Education  2020 [employer]  

## 2020-08-26 NOTE — Progress Notes (Signed)
I,Katawbba Wiggins,acting as a Education administrator for [name], MD.,have documented all relevant documentation on the behalf of [name], MD,as directed by  [name], MD while in the presence of [name], MD.  This visit occurred during the SARS-CoV-2 public health emergency.  Safety protocols were in place, including screening questions prior to the visit, additional usage of staff PPE, and extensive cleaning of exam room while observing appropriate contact time as indicated for disinfecting solutions.  Subjective:     Patient ID: Eric [name]., MD , male    DOB: 1951/09/24 , 69 y.o.   MRN: [id number]   Chief Complaint  Patient presents with  . Diabetes  . Hypertension    HPI  The patient is here for a follow-up on his diabetes and blood pressure. He reports compliance with meds. Denies headaches and cp.   Diabetes He presents for his follow-up diabetic visit. He has type 2 diabetes mellitus. There are no hypoglycemic associated symptoms. Pertinent negatives for diabetes include no blurred vision and no chest pain. There are no hypoglycemic complications. He is following a diabetic diet. Meal planning includes avoidance of concentrated sweets. He participates in exercise intermittently. His breakfast blood glucose is taken between 8-9 am. His breakfast blood glucose range is generally 110-130 mg/dl. An ACE inhibitor/angiotensin II receptor blocker is not being taken. He sees a podiatrist.Eye exam is not current.  Hypertension This is a chronic problem. The current episode started more than 1 year ago. The problem has been gradually improving since onset. The problem is controlled. Pertinent negatives include no blurred vision, chest pain, palpitations or shortness of breath. Risk factors for coronary artery disease include diabetes mellitus, dyslipidemia, sedentary lifestyle, obesity, male gender and stress. Past treatments include beta blockers and diuretics. The current  treatment provides moderate improvement. Hypertensive end-organ damage includes kidney disease.     Past Medical History:  Diagnosis Date  . Acute blood loss anemia   . Acute encephalopathy   . Acute hypoxemic respiratory failure ([place])   . Acute idiopathic gout of right ankle   . Acute lumbar back pain   . Acute on chronic systolic heart failure ([place])   . Acute respiratory failure ([place])   . AKI (acute kidney injury) ([place])   . Altered mental status   . Aortic aneurysm without rupture ([place]) 02/09/2016  . Aortic root enlargement ([place]) 02/13/2012  . Aortic valve regurgitation, acquired 02/13/2012  . Arrhythmia   . Atrial flutter ([place]) 03/18/2012  . Back pain   . Cardiac arrest ([place]) 02/01/2016  . CHF (congestive heart failure) ([place])   . Chronic anticoagulation 03/04/2013  . Chronic kidney disease    kidney fx studies increased   . Chronic lower back pain   . Chronic renal insufficiency   . Chronic systolic heart failure ([place]) 02/13/2012   Recent diagnosis 4 / 2013, LVEF 25% by Echo 12/2011  03/2013: Echo at [facility] Conclusions: 1. Left ventricular ejection fraction estimated by 2D at 40-45 percent. 2. Mild concentric left ventricular hypertrophy. 3. Mild left atrial enlargement. 4. Moderate aortic valve regurgitation. 5. The aortic root at the sinus(es) of valsalva is moderately dilated 6. Mild mitral valve regurgitation. 7.  . CKD (chronic kidney disease)   . CKD (chronic kidney disease), stage IV ([place]) 08/06/2013   Creatinine 2.4 on 07/04/13   . Claustrophobia   . Colon cancer screening 03/04/2013  . Debility 02/22/2016  . Diabetes mellitus type 2 in nonobese (HCC)   .  Diabetes mellitus type 2 in obese ([place])   . Dysrhythmia    "palpitations"  . Encounter for central line placement   . Exertional dyspnea 01/2012  . 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 Goals of care, counseling/discussion 05/05/2020  . HCAP (healthcare-associated pneumonia)   . Heart murmur    . Hyperlipidemia 02/13/2012  . Hypertension   . Hypothyroidism   . Internal hemorrhoids without mention of complication 06/08/7828  . Labile blood pressure   . Left bundle branch block 02/13/2012  . Leg weakness, bilateral   . Long term (current) use of anticoagulants 08/06/2013   Eliquis therapy   . Long term current use of amiodarone 08/07/2016  . Lower extremity weakness   . Migraine 02/13/12   "opthalmic"  . Multiple myeloma ([place]) 05/05/2020  . Non-traumatic rhabdomyolysis   . Obesity (BMI 30-39.9) 02/13/2012  . Pain   . Paroxysmal atrial fibrillation (HCC)   . Pneumonia   . Retroperitoneal bleed   . Right ankle pain   . Right knee pain   . Severe aortic regurgitation 02/09/2016  . Special screening for malignant neoplasms, colon 04/11/2013  . Thyroid activity decreased   . Tibial pain   . Varicose vein of leg    right  . Ventricular fibrillation ([place]) 02/09/2016  . Weakness of both lower extremities      Family History  Problem Relation Age of Onset  . Hypertension Mother   . Heart disease Mother   . Heart failure Mother   . Diabetes Mother   . Hypertension Father   . Heart disease Father   . Heart failure Father      Current Outpatient Medications:  .  acetaminophen (TYLENOL) 500 MG tablet, Take 1,000 mg by mouth every 4 (four) hours as needed for moderate pain or headache.  (Patient not taking: Reported on 09/10/2020), Disp: , Rfl:  .  apixaban (ELIQUIS) 5 MG TABS tablet, Take 1 tablet (5 mg total) by mouth 2 (two) times daily., Disp: 60 tablet, Rfl: 5 .  famciclovir (FAMVIR) 500 MG tablet, Take 1 tablet (500 mg total) by mouth daily., Disp: 30 tablet, Rfl: 3 .  fluticasone (FLONASE) 50 MCG/ACT nasal spray, Place 2 sprays into the nose as needed for allergies. , Disp: , Rfl:  .  furosemide (LASIX) 80 MG tablet, Take 1 tablet (80 mg total) by mouth daily., Disp: 90 tablet, Rfl: 3 .  Hypromellose (ARTIFICIAL TEARS OP), Place 1 drop into both eyes daily as needed (dry  eyes)., Disp: , Rfl:  .  isosorbide-hydrALAZINE (BIDIL) 20-37.5 MG tablet, Take 1 tablet by mouth 3 (three) times daily., Disp: 270 tablet, Rfl: 3 .  levothyroxine (SYNTHROID) 75 MCG tablet, TAKE 1 TABLET BY MOUTH DAILY IN THE MORNING EXCEPT TAKE 1/2 TABLET ON SUNDAYS IN THE MORNING, Disp: 90 tablet, Rfl: 0 .  loperamide (IMODIUM) 2 MG capsule, Take 2 mg by mouth as needed for diarrhea or loose stools., Disp: , Rfl:  .  potassium chloride 20 MEQ/15ML (10%) SOLN, Take 15 mLs (20 mEq total) by mouth 2 (two) times daily. (Patient taking differently: Take 20 mEq by mouth daily.), Disp: 946 mL, Rfl: 6 .  prochlorperazine (COMPAZINE) 10 MG tablet, Take 1 tablet (10 mg total) by mouth every 6 (six) hours as needed (Nausea or vomiting)., Disp: 30 tablet, Rfl: 1 .  rosuvastatin (CRESTOR) 20 MG tablet, Take 1 tablet (20 mg total) by mouth daily., Disp: 90 tablet, Rfl: 2 .  vitamin B-12 (  CYANOCOBALAMIN) 100 MCG tablet, Take 100 mcg by mouth daily., Disp: , Rfl:  .  vitamin C (ASCORBIC ACID) 500 MG tablet, Take 500 mg by mouth as needed. , Disp: , Rfl:  .  VITAMIN D PO, Take 1 capsule by mouth daily., Disp: , Rfl:  .  amiodarone (PACERONE) 100 MG tablet, Take 1 tablet (100 mg total) by mouth daily. Please make overdue appt with Dr. Lovena Le before anymore refills. Thank you 1st attempt, Disp: 30 tablet, Rfl: 0 .  carvedilol (COREG) 6.25 MG tablet, TAKE 1 TABLET(6.25 MG) BY MOUTH TWICE DAILY, Disp: 180 tablet, Rfl: 1 .  docusate sodium (COLACE) 100 MG capsule, Take 100 mg by mouth daily as needed for mild constipation. (Patient not taking: Reported on 09/10/2020), Disp: , Rfl:  .  ipratropium (ATROVENT) 0.06 % nasal spray, Place 2 sprays into both nostrils 2 (two) times daily. (Patient not taking: No sig reported), Disp: 15 mL, Rfl: 3 .  ondansetron (ZOFRAN) 8 MG tablet, Take 1 tablet (8 mg total) by mouth 2 (two) times daily as needed for refractory nausea / vomiting. Start on day 3 after Cytoxan. (Patient not  taking: No sig reported), Disp: 30 tablet, Rfl: 1   Allergies  Allergen Reactions  . Lactose Intolerance (Gi) Diarrhea     Review of Systems  Constitutional: Negative.   Eyes: Negative for blurred vision.  Respiratory: Negative.  Negative for shortness of breath.   Cardiovascular: Negative.  Negative for chest pain and palpitations.  Gastrointestinal: Negative.   Psychiatric/Behavioral: Negative.   All other systems reviewed and are negative.    Today's Vitals   08/26/20 1634  BP: 114/76  Pulse: 70  Temp: 98.4 F (36.9 C)  TempSrc: Oral  Weight: 232 lb 9.6 oz (105.5 kg)  Height: $Remove'5\' 9"'ISFnzrm$  (1.753 m)  PainSc: 0-No pain   Body mass index is 34.35 kg/m.  Wt Readings from Last 3 Encounters:  09/02/20 233 lb (105.7 kg)  08/26/20 232 lb 9.6 oz (105.5 kg)  08/11/20 230 lb 1.9 oz (104.4 kg)   Objective:  Physical Exam Vitals and nursing note reviewed.  Constitutional:      Appearance: Normal appearance. He is obese.  Eyes:     Extraocular Movements: Extraocular movements intact.  Cardiovascular:     Rate and Rhythm: Normal rate and regular rhythm.     Heart sounds: Normal heart sounds.  Pulmonary:     Effort: Pulmonary effort is normal.     Breath sounds: Normal breath sounds.  Skin:    General: Skin is warm.  Neurological:     General: No focal deficit present.     Mental Status: He is alert.  Psychiatric:        Mood and Affect: Mood normal.         Assessment And Plan:     1. Type 2 diabetes mellitus with stage 3 chronic kidney disease, without long-term current use of insulin, unspecified whether stage 3a or 3b CKD ([place]) Comments: Chronic, I will check labs as listed below. He is currently controlled without meds.  H eis encouraged to get updated eye exam.  - Hemoglobin A1c  2. Hypertensive nephropathy Comments: Chronic, well controlled. He will continue with current meds. Encouraged to follow low sodium diet.   3. Multiple myeloma not having achieved  remission (HCC) Comments: Recent diagnosis. Followed by Hem/Onc. Notes reviewed in detail.  It is encouraging to see he is responding well to treatment.   4. Hypothyroidism (acquired) Comments: I  will check thyroid panel and adjust meds as needed.  - TSH  5. Class 1 obesity due to excess calories with serious comorbidity and body mass index (BMI) of 32.0 to 32.9 in adult Comments: He is encouraged to incorporate more exercise into his daily routine.      Patient was given opportunity to ask questions. Patient verbalized understanding of the plan and was able to repeat key elements of the plan. All questions were answered to their satisfaction.  [name], MD   I, [name], MD, have reviewed all documentation for this visit. The documentation on 09/12/20 for the exam, diagnosis, procedures, and orders are all accurate and complete.  THE PATIENT IS ENCOURAGED TO PRACTICE SOCIAL DISTANCING DUE TO THE COVID-19 PANDEMIC.

## 2020-08-27 LAB — HEMOGLOBIN A1C
Est. average glucose Bld gHb Est-mCnc: 128 mg/dL
Hgb A1c MFr Bld: 6.1 % — ABNORMAL HIGH (ref 4.8–5.6)

## 2020-08-27 LAB — TSH: TSH: 2.12 u[IU]/mL (ref 0.450–4.500)

## 2020-09-01 ENCOUNTER — Ambulatory Visit: Payer: Medicare Other

## 2020-09-01 ENCOUNTER — Other Ambulatory Visit: Payer: Medicare Other

## 2020-09-01 ENCOUNTER — Ambulatory Visit: Payer: Medicare Other | Admitting: Hematology & Oncology

## 2020-09-02 ENCOUNTER — Encounter: Payer: Self-pay | Admitting: *Deleted

## 2020-09-02 ENCOUNTER — Inpatient Hospital Stay (HOSPITAL_BASED_OUTPATIENT_CLINIC_OR_DEPARTMENT_OTHER): Payer: Medicare Other | Admitting: Hematology & Oncology

## 2020-09-02 ENCOUNTER — Encounter: Payer: Self-pay | Admitting: Hematology & Oncology

## 2020-09-02 ENCOUNTER — Other Ambulatory Visit: Payer: Self-pay

## 2020-09-02 ENCOUNTER — Inpatient Hospital Stay: Payer: Medicare Other

## 2020-09-02 ENCOUNTER — Inpatient Hospital Stay: Payer: Medicare Other | Attending: Hematology & Oncology

## 2020-09-02 VITALS — BP 133/79 | HR 80 | Temp 98.1°F | Resp 18 | Wt 233.0 lb

## 2020-09-02 DIAGNOSIS — Z79899 Other long term (current) drug therapy: Secondary | ICD-10-CM | POA: Insufficient documentation

## 2020-09-02 DIAGNOSIS — M7989 Other specified soft tissue disorders: Secondary | ICD-10-CM | POA: Insufficient documentation

## 2020-09-02 DIAGNOSIS — C9000 Multiple myeloma not having achieved remission: Secondary | ICD-10-CM | POA: Diagnosis present

## 2020-09-02 DIAGNOSIS — Z5111 Encounter for antineoplastic chemotherapy: Secondary | ICD-10-CM | POA: Insufficient documentation

## 2020-09-02 DIAGNOSIS — Z5112 Encounter for antineoplastic immunotherapy: Secondary | ICD-10-CM | POA: Insufficient documentation

## 2020-09-02 LAB — CBC WITH DIFFERENTIAL (CANCER CENTER ONLY)
Abs Immature Granulocytes: 0 10*3/uL (ref 0.00–0.07)
Basophils Absolute: 0 10*3/uL (ref 0.0–0.1)
Basophils Relative: 1 %
Eosinophils Absolute: 0 10*3/uL (ref 0.0–0.5)
Eosinophils Relative: 1 %
HCT: 33.1 % — ABNORMAL LOW (ref 39.0–52.0)
Hemoglobin: 10.7 g/dL — ABNORMAL LOW (ref 13.0–17.0)
Immature Granulocytes: 0 %
Lymphocytes Relative: 17 %
Lymphs Abs: 0.7 10*3/uL (ref 0.7–4.0)
MCH: 29.2 pg (ref 26.0–34.0)
MCHC: 32.3 g/dL (ref 30.0–36.0)
MCV: 90.4 fL (ref 80.0–100.0)
Monocytes Absolute: 0.7 10*3/uL (ref 0.1–1.0)
Monocytes Relative: 17 %
Neutro Abs: 2.7 10*3/uL (ref 1.7–7.7)
Neutrophils Relative %: 64 %
Platelet Count: 180 10*3/uL (ref 150–400)
RBC: 3.66 MIL/uL — ABNORMAL LOW (ref 4.22–5.81)
RDW: 18.8 % — ABNORMAL HIGH (ref 11.5–15.5)
WBC Count: 4.1 10*3/uL (ref 4.0–10.5)
nRBC: 0 % (ref 0.0–0.2)

## 2020-09-02 LAB — CMP (CANCER CENTER ONLY)
ALT: 27 U/L (ref 0–44)
AST: 28 U/L (ref 15–41)
Albumin: 4.1 g/dL (ref 3.5–5.0)
Alkaline Phosphatase: 70 U/L (ref 38–126)
Anion gap: 9 (ref 5–15)
BUN: 28 mg/dL — ABNORMAL HIGH (ref 8–23)
CO2: 26 mmol/L (ref 22–32)
Calcium: 9.2 mg/dL (ref 8.9–10.3)
Chloride: 105 mmol/L (ref 98–111)
Creatinine: 1.85 mg/dL — ABNORMAL HIGH (ref 0.61–1.24)
GFR, Estimated: 39 mL/min — ABNORMAL LOW (ref >60)
Glucose, Bld: 163 mg/dL — ABNORMAL HIGH (ref 70–99)
Potassium: 3.7 mmol/L (ref 3.5–5.1)
Sodium: 140 mmol/L (ref 135–145)
Total Bilirubin: 0.8 mg/dL (ref 0.3–1.2)
Total Protein: 6 g/dL — ABNORMAL LOW (ref 6.5–8.1)

## 2020-09-02 MED ORDER — SODIUM CHLORIDE 0.9 % IV SOLN
Freq: Once | INTRAVENOUS | Status: AC
  Filled 2020-09-02: qty 250

## 2020-09-02 MED ORDER — PALONOSETRON HCL INJECTION 0.25 MG/5ML
INTRAVENOUS | Status: AC
  Filled 2020-09-02: qty 5

## 2020-09-02 MED ORDER — PALONOSETRON HCL INJECTION 0.25 MG/5ML
0.2500 mg | Freq: Once | INTRAVENOUS | Status: AC
  Administered 2020-09-02: 0.25 mg via INTRAVENOUS

## 2020-09-02 MED ORDER — BORTEZOMIB CHEMO SQ INJECTION 3.5 MG (2.5MG/ML)
1.3000 mg/m2 | Freq: Once | INTRAMUSCULAR | Status: AC
  Administered 2020-09-02: 3 mg via SUBCUTANEOUS
  Filled 2020-09-02: qty 1.2

## 2020-09-02 MED ORDER — SODIUM CHLORIDE 0.9 % IV SOLN
20.0000 mg | Freq: Once | INTRAVENOUS | Status: AC
  Administered 2020-09-02: 20 mg via INTRAVENOUS
  Filled 2020-09-02: qty 20

## 2020-09-02 MED ORDER — SODIUM CHLORIDE 0.9 % IV SOLN
400.0000 mg/m2 | Freq: Once | INTRAVENOUS | Status: AC
  Administered 2020-09-02: 900 mg via INTRAVENOUS
  Filled 2020-09-02: qty 45

## 2020-09-02 NOTE — Patient Instructions (Signed)
Cyclophosphamide Injection What is this medicine? CYCLOPHOSPHAMIDE (sye kloe FOSS fa mide) is a chemotherapy drug. It slows the growth of cancer cells. This medicine is used to treat many types of cancer like lymphoma, myeloma, leukemia, breast cancer, and ovarian cancer, to name a few. This medicine may be used for other purposes; ask your health care provider or pharmacist if you have questions. COMMON BRAND NAME(S): Cytoxan, Neosar What should I tell my health care provider before I take this medicine? They need to know if you have any of these conditions:  heart disease  history of irregular heartbeat  infection  kidney disease  liver disease  low blood counts, like white cells, platelets, or red blood cells  on hemodialysis  recent or ongoing radiation therapy  scarring or thickening of the lungs  trouble passing urine  an unusual or allergic reaction to cyclophosphamide, other medicines, foods, dyes, or preservatives  pregnant or trying to get pregnant  breast-feeding How should I use this medicine? This drug is usually given as an injection into a vein or muscle or by infusion into a vein. It is administered in a hospital or clinic by a specially trained health care professional. Talk to your pediatrician regarding the use of this medicine in children. Special care may be needed. Overdosage: If you think you have taken too much of this medicine contact a poison control center or emergency room at once. NOTE: This medicine is only for you. Do not share this medicine with others. What if I miss a dose? It is important not to miss your dose. Call your doctor or health care professional if you are unable to keep an appointment. What may interact with this medicine?  amphotericin B  azathioprine  certain antivirals for HIV or hepatitis  certain medicines for blood pressure, heart disease, irregular heart beat  certain medicines that treat or prevent blood clots  like warfarin  certain other medicines for cancer  cyclosporine  etanercept  indomethacin  medicines that relax muscles for surgery  medicines to increase blood counts  metronidazole This list may not describe all possible interactions. Give your health care provider a list of all the medicines, herbs, non-prescription drugs, or dietary supplements you use. Also tell them if you smoke, drink alcohol, or use illegal drugs. Some items may interact with your medicine. What should I watch for while using this medicine? Your condition will be monitored carefully while you are receiving this medicine. You may need blood work done while you are taking this medicine. Drink water or other fluids as directed. Urinate often, even at night. Some products may contain alcohol. Ask your health care professional if this medicine contains alcohol. Be sure to tell all health care professionals you are taking this medicine. Certain medicines, like metronidazole and disulfiram, can cause an unpleasant reaction when taken with alcohol. The reaction includes flushing, headache, nausea, vomiting, sweating, and increased thirst. The reaction can last from 30 minutes to several hours. Do not become pregnant while taking this medicine or for 1 year after stopping it. Women should inform their health care professional if they wish to become pregnant or think they might be pregnant. Men should not father a child while taking this medicine and for 4 months after stopping it. There is potential for serious side effects to an unborn child. Talk to your health care professional for more information. Do not breast-feed an infant while taking this medicine or for 1 week after stopping it. This medicine has  caused ovarian failure in some women. This medicine may make it more difficult to get pregnant. Talk to your health care professional if you are concerned about your fertility. This medicine has caused decreased sperm  counts in some men. This may make it more difficult to father a child. Talk to your health care professional if you are concerned about your fertility. Call your health care professional for advice if you get a fever, chills, or sore throat, or other symptoms of a cold or flu. Do not treat yourself. This medicine decreases your body's ability to fight infections. Try to avoid being around people who are sick. Avoid taking medicines that contain aspirin, acetaminophen, ibuprofen, naproxen, or ketoprofen unless instructed by your health care professional. These medicines may hide a fever. Talk to your health care professional about your risk of cancer. You may be more at risk for certain types of cancer if you take this medicine. If you are going to need surgery or other procedure, tell your health care professional that you are using this medicine. Be careful brushing or flossing your teeth or using a toothpick because you may get an infection or bleed more easily. If you have any dental work done, tell your dentist you are receiving this medicine. What side effects may I notice from receiving this medicine? Side effects that you should report to your doctor or health care professional as soon as possible:  allergic reactions like skin rash, itching or hives, swelling of the face, lips, or tongue  breathing problems  nausea, vomiting  signs and symptoms of bleeding such as bloody or black, tarry stools; red or dark brown urine; spitting up blood or brown material that looks like coffee grounds; red spots on the skin; unusual bruising or bleeding from the eyes, gums, or nose  signs and symptoms of heart failure like fast, irregular heartbeat, sudden weight gain; swelling of the ankles, feet, hands  signs and symptoms of infection like fever; chills; cough; sore throat; pain or trouble passing urine  signs and symptoms of kidney injury like trouble passing urine or change in the amount of  urine  signs and symptoms of liver injury like dark yellow or brown urine; general ill feeling or flu-like symptoms; light-colored stools; loss of appetite; nausea; right upper belly pain; unusually weak or tired; yellowing of the eyes or skin Side effects that usually do not require medical attention (report to your doctor or health care professional if they continue or are bothersome):  confusion  decreased hearing  diarrhea  facial flushing  hair loss  headache  loss of appetite  missed menstrual periods  signs and symptoms of low red blood cells or anemia such as unusually weak or tired; feeling faint or lightheaded; falls  skin discoloration This list may not describe all possible side effects. Call your doctor for medical advice about side effects. You may report side effects to FDA at 1-800-FDA-1088. Where should I keep my medicine? This drug is given in a hospital or clinic and will not be stored at home. NOTE: This sheet is a summary. It may not cover all possible information. If you have questions about this medicine, talk to your doctor, pharmacist, or health care provider.  2020 Elsevier/Gold Standard (2019-06-16 09:53:29)      Bortezomib injection What is this medicine? BORTEZOMIB (bor TEZ oh mib) is a medicine that targets proteins in cancer cells and stops the cancer cells from growing. It is used to treat multiple myeloma and  mantle-cell lymphoma. This medicine may be used for other purposes; ask your health care provider or pharmacist if you have questions. COMMON BRAND NAME(S): Velcade What should I tell my health care provider before I take this medicine? They need to know if you have any of these conditions:  diabetes  heart disease  irregular heartbeat  liver disease  on hemodialysis  low blood counts, like low white blood cells, platelets, or hemoglobin  peripheral neuropathy  taking medicine for blood pressure  an unusual or allergic  reaction to bortezomib, mannitol, boron, other medicines, foods, dyes, or preservatives  pregnant or trying to get pregnant  breast-feeding How should I use this medicine? This medicine is for injection into a vein or for injection under the skin. It is given by a health care professional in a hospital or clinic setting. Talk to your pediatrician regarding the use of this medicine in children. Special care may be needed. Overdosage: If you think you have taken too much of this medicine contact a poison control center or emergency room at once. NOTE: This medicine is only for you. Do not share this medicine with others. What if I miss a dose? It is important not to miss your dose. Call your doctor or health care professional if you are unable to keep an appointment. What may interact with this medicine? This medicine may interact with the following medications:  ketoconazole  rifampin  ritonavir  St. John's Wort This list may not describe all possible interactions. Give your health care provider a list of all the medicines, herbs, non-prescription drugs, or dietary supplements you use. Also tell them if you smoke, drink alcohol, or use illegal drugs. Some items may interact with your medicine. What should I watch for while using this medicine? You may get drowsy or dizzy. Do not drive, use machinery, or do anything that needs mental alertness until you know how this medicine affects you. Do not stand or sit up quickly, especially if you are an older patient. This reduces the risk of dizzy or fainting spells. In some cases, you may be given additional medicines to help with side effects. Follow all directions for their use. Call your doctor or health care professional for advice if you get a fever, chills or sore throat, or other symptoms of a cold or flu. Do not treat yourself. This drug decreases your body's ability to fight infections. Try to avoid being around people who are sick. This  medicine may increase your risk to bruise or bleed. Call your doctor or health care professional if you notice any unusual bleeding. You may need blood work done while you are taking this medicine. In some patients, this medicine may cause a serious brain infection that may cause death. If you have any problems seeing, thinking, speaking, walking, or standing, tell your doctor right away. If you cannot reach your doctor, urgently seek other source of medical care. Check with your doctor or health care professional if you get an attack of severe diarrhea, nausea and vomiting, or if you sweat a lot. The loss of too much body fluid can make it dangerous for you to take this medicine. Do not become pregnant while taking this medicine or for at least 7 months after stopping it. Women should inform their doctor if they wish to become pregnant or think they might be pregnant. Men should not father a child while taking this medicine and for at least 4 months after stopping it. There is a  potential for serious side effects to an unborn child. Talk to your health care professional or pharmacist for more information. Do not breast-feed an infant while taking this medicine or for 2 months after stopping it. This medicine may interfere with the ability to have a child. You should talk with your doctor or health care professional if you are concerned about your fertility. What side effects may I notice from receiving this medicine? Side effects that you should report to your doctor or health care professional as soon as possible:  allergic reactions like skin rash, itching or hives, swelling of the face, lips, or tongue  breathing problems  changes in hearing  changes in vision  fast, irregular heartbeat  feeling faint or lightheaded, falls  pain, tingling, numbness in the hands or feet  right upper belly pain  seizures  swelling of the ankles, feet, hands  unusual bleeding or bruising  unusually  weak or tired  vomiting  yellowing of the eyes or skin Side effects that usually do not require medical attention (report to your doctor or health care professional if they continue or are bothersome):  changes in emotions or moods  constipation  diarrhea  loss of appetite  headache  irritation at site where injected  nausea This list may not describe all possible side effects. Call your doctor for medical advice about side effects. You may report side effects to FDA at 1-800-FDA-1088. Where should I keep my medicine? This drug is given in a hospital or clinic and will not be stored at home. NOTE: This sheet is a summary. It may not cover all possible information. If you have questions about this medicine, talk to your doctor, pharmacist, or health care provider.  2020 Elsevier/Gold Standard (2018-01-21 16:29:31)

## 2020-09-02 NOTE — Progress Notes (Signed)
Hematology and Oncology Follow Up Visit  Eric Krantz., MD [id number] 09/21/1951 69 y.o. 09/02/2020   Principle Diagnosis:   IgG kappa myeloma  - 1q+  Current Therapy:    CyBorD -- s/p cycle #3  - start on 06/04/2020     Interim History:  Eric [name] is back for follow-up.  I am absolutely impressed as to how well he has responded to treatment.  Her last saw him in November, his M spike was down to 0.2 g/dL.  His IgG level was down to 440 mg/dL.  His Kappa light chain was down to 11.7 mg/L.  As such, he clearly is on the right protocol.  Now the question is whether or not he is going to be a stem cell transplant candidate.  I know this is going to be a "iffy" decision.  Again I have to get another bone marrow biopsy on him.  I would like to get one after this cycle.  We will then see how well he has responded.  He feels well.  He had a good Thanksgiving.  He had no problems with nausea or vomiting.  He is still working.  He is trying to pace himself.  He is staying busy.  He has had no cough or shortness of breath.  He has had no nausea or vomiting.  There is been a little bit of diarrhea after the Velcade.  He has little bit of leg swelling.  His cardiologist increased the Lasix.  He has had no problems with fever.  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   Medications:  Current Outpatient Medications:  .  acetaminophen (TYLENOL) 500 MG tablet, Take 1,000 mg by mouth every 4 (four) hours as needed for moderate pain or headache. , Disp: , Rfl:  .  amiodarone (PACERONE) 100 MG tablet, Take 1 tablet (100 mg total) by mouth daily., Disp: 90 tablet, Rfl: 3 .  apixaban (ELIQUIS) 5 MG TABS tablet, Take 1 tablet (5 mg total) by mouth 2 (two) times daily., Disp: 60 tablet, Rfl: 5 .  carvedilol (COREG) 6.25 MG tablet, TAKE 1 TABLET(6.25 MG) BY MOUTH TWICE DAILY, Disp: 60 tablet, Rfl: 0 .  docusate sodium (COLACE) 100 MG capsule, Take 100 mg by mouth daily as needed for mild  constipation. (Patient not taking: Reported on 08/11/2020), Disp: , Rfl:  .  famciclovir (FAMVIR) 500 MG tablet, Take 1 tablet (500 mg total) by mouth daily., Disp: 30 tablet, Rfl: 3 .  fluticasone (FLONASE) 50 MCG/ACT nasal spray, Place 2 sprays into the nose as needed for allergies. , Disp: , Rfl:  .  furosemide (LASIX) 80 MG tablet, Take 1 tablet (80 mg total) by mouth daily., Disp: 90 tablet, Rfl: 3 .  Hypromellose (ARTIFICIAL TEARS OP), Place 1 drop into both eyes daily as needed (dry eyes)., Disp: , Rfl:  .  ipratropium (ATROVENT) 0.06 % nasal spray, Place 2 sprays into both nostrils 2 (two) times daily. (Patient not taking: Reported on 08/11/2020), Disp: 15 mL, Rfl: 3 .  isosorbide-hydrALAZINE (BIDIL) 20-37.5 MG tablet, Take 1 tablet by mouth 3 (three) times daily., Disp: 270 tablet, Rfl: 3 .  levothyroxine (SYNTHROID) 75 MCG tablet, TAKE 1 TABLET BY MOUTH DAILY IN THE MORNING EXCEPT TAKE 1/2 TABLET ON SUNDAYS IN THE MORNING, Disp: 90 tablet, Rfl: 0 .  loperamide (IMODIUM) 2 MG capsule, Take 2 mg by mouth as needed for diarrhea or loose stools., Disp: , Rfl:  .  ondansetron (ZOFRAN) 8 MG tablet,  Take 1 tablet (8 mg total) by mouth 2 (two) times daily as needed for refractory nausea / vomiting. Start on day 3 after Cytoxan. (Patient not taking: Reported on 08/11/2020), Disp: 30 tablet, Rfl: 1 .  potassium chloride 20 MEQ/15ML (10%) SOLN, Take 15 mLs (20 mEq total) by mouth 2 (two) times daily. (Patient taking differently: Take 20 mEq by mouth daily. ), Disp: 946 mL, Rfl: 6 .  prochlorperazine (COMPAZINE) 10 MG tablet, Take 1 tablet (10 mg total) by mouth every 6 (six) hours as needed (Nausea or vomiting)., Disp: 30 tablet, Rfl: 1 .  rosuvastatin (CRESTOR) 20 MG tablet, Take 1 tablet (20 mg total) by mouth daily., Disp: 90 tablet, Rfl: 2 .  vitamin B-12 (CYANOCOBALAMIN) 100 MCG tablet, Take 100 mcg by mouth daily. , Disp: , Rfl:  .  vitamin C (ASCORBIC ACID) 500 MG tablet, Take 500 mg by mouth as  needed. , Disp: , Rfl:  .  VITAMIN D PO, Take 1 capsule by mouth daily., Disp: , Rfl:   Allergies:  Allergies  Allergen Reactions  . Lactose Intolerance (Gi) Diarrhea    Past Medical History, Surgical history, Social history, and Family History were reviewed and updated.  Review of Systems: Review of Systems  Constitutional: Negative.   HENT:  Negative.   Eyes: Negative.   Respiratory: Negative.   Cardiovascular: Negative.   Gastrointestinal: Negative.   Endocrine: Negative.   Genitourinary: Negative.    Musculoskeletal: Negative.   Skin: Negative.   Neurological: Negative.   Hematological: Negative.   Psychiatric/Behavioral: Negative.     Physical Exam:  weight is 233 lb (105.7 kg). His oral temperature is 98.1 F (36.7 C). His blood pressure is 133/79 and his pulse is 80. His respiration is 18 and oxygen saturation is 99%.   Wt Readings from Last 3 Encounters:  09/02/20 233 lb (105.7 kg)  08/26/20 232 lb 9.6 oz (105.5 kg)  08/11/20 230 lb 1.9 oz (104.4 kg)    Physical Exam Vitals reviewed.  HENT:     Head: Normocephalic and atraumatic.  Eyes:     Pupils: Pupils are equal, round, and reactive to light.  Cardiovascular:     Rate and Rhythm: Normal rate and regular rhythm.     Heart sounds: Normal heart sounds.  Pulmonary:     Effort: Pulmonary effort is normal.     Breath sounds: Normal breath sounds.  Abdominal:     General: Bowel sounds are normal.     Palpations: Abdomen is soft.  Musculoskeletal:        General: No tenderness or deformity. Normal range of motion.     Cervical back: Normal range of motion.  Lymphadenopathy:     Cervical: No cervical adenopathy.  Skin:    General: Skin is warm and dry.     Findings: No erythema or rash.  Neurological:     Mental Status: He is alert and oriented to person, place, and time.  Psychiatric:        Behavior: Behavior normal.        Thought Content: Thought content normal.        Judgment: Judgment normal.       Lab Results  Component Value Date   WBC 4.1 09/02/2020   HGB 10.7 (L) 09/02/2020   HCT 33.1 (L) 09/02/2020   MCV 90.4 09/02/2020   PLT 180 09/02/2020     Chemistry      Component Value Date/Time   NA 140 09/02/2020 1411  NA 146 (H) 07/30/2020 1555   K 3.7 09/02/2020 1411   CL 105 09/02/2020 1411   CO2 26 09/02/2020 1411   BUN 28 (H) 09/02/2020 1411   BUN 28 (H) 07/30/2020 1555   CREATININE 1.85 (H) 09/02/2020 1411   CREATININE 1.95 (H) 09/08/2016 0859      Component Value Date/Time   CALCIUM 9.2 09/02/2020 1411   ALKPHOS 70 09/02/2020 1411   AST 28 09/02/2020 1411   ALT 27 09/02/2020 1411   BILITOT 0.8 09/02/2020 1411      Impression and Plan: Eric [name] is a very nice 69 year old African-American male.  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  He has IgG kappa myeloma.  I do not think that he has bad cytogenetics or FISH.  Again, his levels have come down remarkably well.  We will definitely get another bone marrow biopsy on him.  We will try to get this before the end of the year.  We will see how this looks.  I would have to believe that he has less than 10% plasma cells.  If everything looks good on the biopsy, then we will get him out to [facility] for a consideration of stem cell transplantation.  I am just happy that his quality of life is doing well.  I am happy that he is able to still work and work at the pace that he likes.  I will get him back in early January.   Volanda Napoleon, MD 12/9/20213:15 PM

## 2020-09-02 NOTE — Progress Notes (Signed)
Reviewed pt labs with Dr. Marin Olp and pt ok to treat with 09/02/2020 lab values.  Pt discharged in no apparent distress. Pt left ambulatory without assistance.  Pt aware of discharge instructions and verbalized understanding and had no further questions.

## 2020-09-03 ENCOUNTER — Telehealth: Payer: Self-pay | Admitting: Hematology & Oncology

## 2020-09-03 LAB — KAPPA/LAMBDA LIGHT CHAINS
Kappa free light chain: 19.2 mg/L (ref 3.3–19.4)
Kappa, lambda light chain ratio: 1.34 (ref 0.26–1.65)
Lambda free light chains: 14.3 mg/L (ref 5.7–26.3)

## 2020-09-03 LAB — IGG, IGA, IGM
IgA: 38 mg/dL — ABNORMAL LOW (ref 61–437)
IgG (Immunoglobin G), Serum: 444 mg/dL — ABNORMAL LOW (ref 603–1613)
IgM (Immunoglobulin M), Srm: 17 mg/dL — ABNORMAL LOW (ref 20–172)

## 2020-09-03 NOTE — Telephone Encounter (Signed)
I called and spoke with patient regarding appointments scheduled per 12/9 los

## 2020-09-03 NOTE — Progress Notes (Signed)
Patient will need bone marrow biopsy after this cycle. Scheduled for 09/21/20.  Oncology Nurse Navigator Documentation  Oncology Nurse Navigator Flowsheets 09/02/2020  Abnormal Finding Date -  Confirmed Diagnosis Date -  Diagnosis Status -  Planned Course of Treatment -  Phase of Treatment -  Chemotherapy Pending- Reason: -  Chemotherapy Actual Start Date: -  Navigator Follow Up Date: 09/21/2020  Navigator Follow Up Reason: Other:  Navigator Location [facility]  Referral Date to RadOnc/MedOnc -  Navigator Encounter Type Treatment;Appt/Treatment Plan Review  Telephone -  Treatment Initiated Date -  Patient Visit Type MedOnc  Treatment Phase Active Tx  Barriers/Navigation Needs Coordination of Care;Education;Employed  Education -  Interventions Coordination of Care  Acuity Level 2-Minimal Needs (1-2 Barriers Identified)  Coordination of Care Radiology  Education Method -  Support Groups/Services Friends and Family  Time Spent with Patient 30

## 2020-09-03 NOTE — Telephone Encounter (Signed)
Appointments scheudled and patient was notified as well.  Calendar was mailed also/ per 12/9 los

## 2020-09-04 ENCOUNTER — Other Ambulatory Visit: Payer: Self-pay | Admitting: Interventional Cardiology

## 2020-09-04 ENCOUNTER — Other Ambulatory Visit: Payer: Self-pay | Admitting: Internal Medicine

## 2020-09-06 MED ORDER — AMIODARONE HCL 100 MG PO TABS: 100.0000 mg | ORAL_TABLET | Freq: Every day | ORAL | 0 refills | Status: DC

## 2020-09-07 ENCOUNTER — Ambulatory Visit (INDEPENDENT_AMBULATORY_CARE_PROVIDER_SITE_OTHER): Payer: Medicare Other

## 2020-09-07 DIAGNOSIS — I428 Other cardiomyopathies: Secondary | ICD-10-CM

## 2020-09-07 DIAGNOSIS — I5022 Chronic systolic (congestive) heart failure: Secondary | ICD-10-CM | POA: Diagnosis not present

## 2020-09-07 LAB — PROTEIN ELECTROPHORESIS, SERUM, WITH REFLEX
A/G Ratio: 1.8 — ABNORMAL HIGH (ref 0.7–1.7)
Albumin ELP: 3.8 g/dL (ref 2.9–4.4)
Alpha-1-Globulin: 0.3 g/dL (ref 0.0–0.4)
Alpha-2-Globulin: 0.6 g/dL (ref 0.4–1.0)
Beta Globulin: 0.9 g/dL (ref 0.7–1.3)
Gamma Globulin: 0.4 g/dL (ref 0.4–1.8)
Globulin, Total: 2.1 g/dL — ABNORMAL LOW (ref 2.2–3.9)
M-Spike, %: 0.1 g/dL — ABNORMAL HIGH
SPEP Interpretation: 0
Total Protein ELP: 5.9 g/dL — ABNORMAL LOW (ref 6.0–8.5)

## 2020-09-07 LAB — IMMUNOFIXATION REFLEX, SERUM
IgA: 41 mg/dL — ABNORMAL LOW (ref 61–437)
IgG (Immunoglobin G), Serum: 480 mg/dL — ABNORMAL LOW (ref 603–1613)
IgM (Immunoglobulin M), Srm: 17 mg/dL — ABNORMAL LOW (ref 20–172)

## 2020-09-09 ENCOUNTER — Other Ambulatory Visit: Payer: Medicare Other

## 2020-09-09 ENCOUNTER — Ambulatory Visit: Payer: Medicare Other

## 2020-09-09 LAB — CUP PACEART REMOTE DEVICE CHECK
Battery Remaining Longevity: 35 mo
Battery Remaining Percentage: 42 %
Battery Voltage: 2.92 V
Date Time Interrogation Session: 20211216152322
HighPow Impedance: 57 Ohm
HighPow Impedance: 57 Ohm
Implantable Lead Implant Date: 20170525
Implantable Lead Implant Date: 20170525
Implantable Lead Implant Date: 20170525
Implantable Lead Location: 753858
Implantable Lead Location: 753859
Implantable Lead Location: 753860
Implantable Lead Model: 7122
Implantable Pulse Generator Implant Date: 20170525
Lead Channel Impedance Value: 380 Ohm
Lead Channel Impedance Value: 410 Ohm
Lead Channel Impedance Value: 610 Ohm
Lead Channel Pacing Threshold Amplitude: 0.5 V
Lead Channel Pacing Threshold Amplitude: 1.5 V
Lead Channel Pacing Threshold Pulse Width: 0.5 ms
Lead Channel Pacing Threshold Pulse Width: 0.5 ms
Lead Channel Sensing Intrinsic Amplitude: 0.5 mV
Lead Channel Sensing Intrinsic Amplitude: 11.7 mV
Lead Channel Setting Pacing Amplitude: 2 V
Lead Channel Setting Pacing Amplitude: 2.5 V
Lead Channel Setting Pacing Pulse Width: 0.5 ms
Lead Channel Setting Pacing Pulse Width: 0.5 ms
Lead Channel Setting Sensing Sensitivity: 0.5 mV
Pulse Gen Serial Number: 7357926

## 2020-09-10 ENCOUNTER — Inpatient Hospital Stay: Payer: Medicare Other

## 2020-09-10 ENCOUNTER — Other Ambulatory Visit: Payer: Self-pay

## 2020-09-10 VITALS — BP 127/75 | HR 75 | Temp 98.3°F | Resp 17

## 2020-09-10 DIAGNOSIS — C9000 Multiple myeloma not having achieved remission: Secondary | ICD-10-CM

## 2020-09-10 DIAGNOSIS — Z5112 Encounter for antineoplastic immunotherapy: Secondary | ICD-10-CM | POA: Diagnosis not present

## 2020-09-10 LAB — COMPREHENSIVE METABOLIC PANEL
ALT: 27 U/L (ref 0–44)
AST: 26 U/L (ref 15–41)
Albumin: 3.9 g/dL (ref 3.5–5.0)
Alkaline Phosphatase: 62 U/L (ref 38–126)
Anion gap: 8 (ref 5–15)
BUN: 28 mg/dL — ABNORMAL HIGH (ref 8–23)
CO2: 25 mmol/L (ref 22–32)
Calcium: 9.1 mg/dL (ref 8.9–10.3)
Chloride: 105 mmol/L (ref 98–111)
Creatinine, Ser: 1.8 mg/dL — ABNORMAL HIGH (ref 0.61–1.24)
GFR, Estimated: 40 mL/min — ABNORMAL LOW (ref >60)
Glucose, Bld: 153 mg/dL — ABNORMAL HIGH (ref 70–99)
Potassium: 3.9 mmol/L (ref 3.5–5.1)
Sodium: 138 mmol/L (ref 135–145)
Total Bilirubin: 1 mg/dL (ref 0.3–1.2)
Total Protein: 5.8 g/dL — ABNORMAL LOW (ref 6.5–8.1)

## 2020-09-10 LAB — CBC WITH DIFFERENTIAL/PLATELET
Abs Immature Granulocytes: 0.03 10*3/uL (ref 0.00–0.07)
Basophils Absolute: 0 10*3/uL (ref 0.0–0.1)
Basophils Relative: 1 %
Eosinophils Absolute: 0 10*3/uL (ref 0.0–0.5)
Eosinophils Relative: 1 %
HCT: 33 % — ABNORMAL LOW (ref 39.0–52.0)
Hemoglobin: 10.7 g/dL — ABNORMAL LOW (ref 13.0–17.0)
Immature Granulocytes: 1 %
Lymphocytes Relative: 10 %
Lymphs Abs: 0.6 10*3/uL — ABNORMAL LOW (ref 0.7–4.0)
MCH: 29.2 pg (ref 26.0–34.0)
MCHC: 32.4 g/dL (ref 30.0–36.0)
MCV: 89.9 fL (ref 80.0–100.0)
Monocytes Absolute: 1.1 10*3/uL — ABNORMAL HIGH (ref 0.1–1.0)
Monocytes Relative: 19 %
Neutro Abs: 4 10*3/uL (ref 1.7–7.7)
Neutrophils Relative %: 68 %
Platelets: 136 10*3/uL — ABNORMAL LOW (ref 150–400)
RBC: 3.67 MIL/uL — ABNORMAL LOW (ref 4.22–5.81)
RDW: 18.9 % — ABNORMAL HIGH (ref 11.5–15.5)
WBC: 5.8 10*3/uL (ref 4.0–10.5)
nRBC: 0 % (ref 0.0–0.2)

## 2020-09-10 MED ORDER — SODIUM CHLORIDE 0.9 % IV SOLN
Freq: Once | INTRAVENOUS | Status: AC
  Filled 2020-09-10: qty 250

## 2020-09-10 MED ORDER — SODIUM CHLORIDE 0.9 % IV SOLN
400.0000 mg/m2 | Freq: Once | INTRAVENOUS | Status: AC
  Administered 2020-09-10: 15:00:00 900 mg via INTRAVENOUS
  Filled 2020-09-10: qty 45

## 2020-09-10 MED ORDER — PALONOSETRON HCL INJECTION 0.25 MG/5ML
INTRAVENOUS | Status: AC
  Filled 2020-09-10: qty 5

## 2020-09-10 MED ORDER — BORTEZOMIB CHEMO SQ INJECTION 3.5 MG (2.5MG/ML)
1.3000 mg/m2 | Freq: Once | INTRAMUSCULAR | Status: AC
  Administered 2020-09-10: 15:00:00 3 mg via SUBCUTANEOUS
  Filled 2020-09-10: qty 1.2

## 2020-09-10 MED ORDER — PALONOSETRON HCL INJECTION 0.25 MG/5ML
0.2500 mg | Freq: Once | INTRAVENOUS | Status: AC
  Administered 2020-09-10: 14:00:00 0.25 mg via INTRAVENOUS

## 2020-09-10 MED ORDER — SODIUM CHLORIDE 0.9 % IV SOLN
20.0000 mg | Freq: Once | INTRAVENOUS | Status: AC
  Administered 2020-09-10: 14:00:00 20 mg via INTRAVENOUS
  Filled 2020-09-10: qty 20

## 2020-09-10 NOTE — Patient Instructions (Addendum)
[facility] Discharge Instructions for Patients Receiving Chemotherapy  Today you received the following chemotherapy agents Cytoxan/Velcade To help prevent nausea and vomiting after your treatment, we encourage you to take your nausea medication as prescribed.    If you develop nausea and vomiting that is not controlled by your nausea medication, call the clinic.   BELOW ARE SYMPTOMS THAT SHOULD BE REPORTED IMMEDIATELY:  *FEVER GREATER THAN 100.5 F  *CHILLS WITH OR WITHOUT FEVER NAUSEA AND VOMITING THAT IS Palonosetron Injection What is this medicine? PALONOSETRON (pal oh NOE se tron) is used to prevent nausea and vomiting caused by chemotherapy. It also helps prevent delayed nausea and vomiting that may occur a few days after your treatment. This medicine may be used for other purposes; ask your health care provider or pharmacist if you have questions. COMMON BRAND NAME(S): Aloxi What should I tell my health care provider before I take this medicine? They need to know if you have any of these conditions: an unusual or allergic reaction to palonosetron, dolasetron, granisetron, ondansetron, other medicines, foods, dyes, or preservatives pregnant or trying to get pregnant breast-feeding How should I use this medicine? This medicine is for infusion into a vein. It is given by a health care professional in a hospital or clinic setting. Talk to your pediatrician regarding the use of this medicine in children. While this drug may be prescribed for children as young as 1 month for selected conditions, precautions do apply. Overdosage: If you think you have taken too much of this medicine contact a poison control center or emergency room at once. NOTE: This medicine is only for you. Do not share this medicine with others. What if I miss a dose? This does not apply. What may interact with this medicine? certain medicines for depression, anxiety, or psychotic  disturbances fentanyl linezolid MAOIs like Carbex, Eldepryl, Marplan, Nardil, and Parnate methylene blue (injected into a vein) tramadol This list may not describe all possible interactions. Give your health care provider a list of all the medicines, herbs, non-prescription drugs, or dietary supplements you use. Also tell them if you smoke, drink alcohol, or use illegal drugs. Some items may interact with your medicine. What should I watch for while using this medicine? Your condition will be monitored carefully while you are receiving this medicine. What side effects may I notice from receiving this medicine? Side effects that you should report to your doctor or health care professional as soon as possible: allergic reactions like skin rash, itching or hives, swelling of the face, lips, or tongue breathing problems confusion dizziness fast, irregular heartbeat fever and chills loss of balance or coordination seizures sweating swelling of the hands and feet tremors unusually weak or tired Side effects that usually do not require medical attention (report to your doctor or health care professional if they continue or are bothersome): constipation or diarrhea headache This list may not describe all possible side effects. Call your doctor for medical advice about side effects. You may report side effects to FDA at 1-800-FDA-1088. Where should I keep my medicine? This drug is given in a hospital or clinic and will not be stored at home. NOTE: This sheet is a summary. It may not cover all possible information. If you have questions about this medicine, talk to your doctor, pharmacist, or health care provider.  2020 Elsevier/Gold Standard (2013-07-18 10:38:36) Cyclophosphamide Injection What is this medicine? CYCLOPHOSPHAMIDE (sye kloe FOSS fa mide) is a chemotherapy drug. It slows the growth of  cancer cells. This medicine is used to treat many types of cancer like lymphoma, myeloma,  leukemia, breast cancer, and ovarian cancer, to name a few. This medicine may be used for other purposes; ask your health care provider or pharmacist if you have questions. COMMON BRAND NAME(S): Cytoxan, Neosar What should I tell my health care provider before I take this medicine? They need to know if you have any of these conditions: heart disease history of irregular heartbeat infection kidney disease liver disease low blood counts, like white cells, platelets, or red blood cells on hemodialysis recent or ongoing radiation therapy scarring or thickening of the lungs trouble passing urine an unusual or allergic reaction to cyclophosphamide, other medicines, foods, dyes, or preservatives pregnant or trying to get pregnant breast-feeding How should I use this medicine? This drug is usually given as an injection into a vein or muscle or by infusion into a vein. It is administered in a hospital or clinic by a specially trained health care professional. Talk to your pediatrician regarding the use of this medicine in children. Special care may be needed. Overdosage: If you think you have taken too much of this medicine contact a poison control center or emergency room at once. NOTE: This medicine is only for you. Do not share this medicine with others. What if I miss a dose? It is important not to miss your dose. Call your doctor or health care professional if you are unable to keep an appointment. What may interact with this medicine? amphotericin B azathioprine certain antivirals for HIV or hepatitis certain medicines for blood pressure, heart disease, irregular heart beat certain medicines that treat or prevent blood clots like warfarin certain other medicines for cancer cyclosporine etanercept indomethacin medicines that relax muscles for surgery medicines to increase blood counts metronidazole This list may not describe all possible interactions. Give your health care provider  a list of all the medicines, herbs, non-prescription drugs, or dietary supplements you use. Also tell them if you smoke, drink alcohol, or use illegal drugs. Some items may interact with your medicine. What should I watch for while using this medicine? Your condition will be monitored carefully while you are receiving this medicine. You may need blood work done while you are taking this medicine. Drink water or other fluids as directed. Urinate often, even at night. Some products may contain alcohol. Ask your health care professional if this medicine contains alcohol. Be sure to tell all health care professionals you are taking this medicine. Certain medicines, like metronidazole and disulfiram, can cause an unpleasant reaction when taken with alcohol. The reaction includes flushing, headache, nausea, vomiting, sweating, and increased thirst. The reaction can last from 30 minutes to several hours. Do not become pregnant while taking this medicine or for 1 year after stopping it. Women should inform their health care professional if they wish to become pregnant or think they might be pregnant. Men should not father a child while taking this medicine and for 4 months after stopping it. There is potential for serious side effects to an unborn child. Talk to your health care professional for more information. Do not breast-feed an infant while taking this medicine or for 1 week after stopping it. This medicine has caused ovarian failure in some women. This medicine may make it more difficult to get pregnant. Talk to your health care professional if you are concerned about your fertility. This medicine has caused decreased sperm counts in some men. This may make it more difficult  to father a child. Talk to your health care professional if you are concerned about your fertility. Call your health care professional for advice if you get a fever, chills, or sore throat, or other symptoms of a cold or flu. Do not  treat yourself. This medicine decreases your body's ability to fight infections. Try to avoid being around people who are sick. Avoid taking medicines that contain aspirin, acetaminophen, ibuprofen, naproxen, or ketoprofen unless instructed by your health care professional. These medicines may hide a fever. Talk to your health care professional about your risk of cancer. You may be more at risk for certain types of cancer if you take this medicine. If you are going to need surgery or other procedure, tell your health care professional that you are using this medicine. Be careful brushing or flossing your teeth or using a toothpick because you may get an infection or bleed more easily. If you have any dental work done, tell your dentist you are receiving this medicine. What side effects may I notice from receiving this medicine? Side effects that you should report to your doctor or health care professional as soon as possible: allergic reactions like skin rash, itching or hives, swelling of the face, lips, or tongue breathing problems nausea, vomiting signs and symptoms of bleeding such as bloody or black, tarry stools; red or dark brown urine; spitting up blood or brown material that looks like coffee grounds; red spots on the skin; unusual bruising or bleeding from the eyes, gums, or nose signs and symptoms of heart failure like fast, irregular heartbeat, sudden weight gain; swelling of the ankles, feet, hands signs and symptoms of infection like fever; chills; cough; sore throat; pain or trouble passing urine signs and symptoms of kidney injury like trouble passing urine or change in the amount of urine signs and symptoms of liver injury like dark yellow or brown urine; general ill feeling or flu-like symptoms; light-colored stools; loss of appetite; nausea; right upper belly pain; unusually weak or tired; yellowing of the eyes or skin Side effects that usually do not require medical attention  (report to your doctor or health care professional if they continue or are bothersome): confusion decreased hearing diarrhea facial flushing hair loss headache loss of appetite missed menstrual periods signs and symptoms of low red blood cells or anemia such as unusually weak or tired; feeling faint or lightheaded; falls skin discoloration This list may not describe all possible side effects. Call your doctor for medical advice about side effects. You may report side effects to FDA at 1-800-FDA-1088. Where should I keep my medicine? This drug is given in a hospital or clinic and will not be stored at home. NOTE: This sheet is a summary. It may not cover all possible information. If you have questions about this medicine, talk to your doctor, pharmacist, or health care provider.  2020 Elsevier/Gold Standard (2019-06-16 09:53:29)  NOT CONTROLLED WITH YOUR NAUSEA MEDICATION  *UNUSUAL SHORTNESS OF BREATH  *UNUSUAL BRUISING OR BLEEDING  TENDERNESS IN MOUTH AND THROAT WITH OR WITHOUT PRESENCE OF ULCERS Bortezomib injection What is this medicine? BORTEZOMIB (bor TEZ oh mib) is a medicine that targets proteins in cancer cells and stops the cancer cells from growing. It is used to treat multiple myeloma and mantle-cell lymphoma. This medicine may be used for other purposes; ask your health care provider or pharmacist if you have questions. COMMON BRAND NAME(S): Velcade What should I tell my health care provider before I take this medicine? They  need to know if you have any of these conditions: diabetes heart disease irregular heartbeat liver disease on hemodialysis low blood counts, like low white blood cells, platelets, or hemoglobin peripheral neuropathy taking medicine for blood pressure an unusual or allergic reaction to bortezomib, mannitol, boron, other medicines, foods, dyes, or preservatives pregnant or trying to get pregnant breast-feeding How should I use this  medicine? This medicine is for injection into a vein or for injection under the skin. It is given by a health care professional in a hospital or clinic setting. Talk to your pediatrician regarding the use of this medicine in children. Special care may be needed. Overdosage: If you think you have taken too much of this medicine contact a poison control center or emergency room at once. NOTE: This medicine is only for you. Do not share this medicine with others. What if I miss a dose? It is important not to miss your dose. Call your doctor or health care professional if you are unable to keep an appointment. What may interact with this medicine? This medicine may interact with the following medications: ketoconazole rifampin ritonavir St. John's Wort This list may not describe all possible interactions. Give your health care provider a list of all the medicines, herbs, non-prescription drugs, or dietary supplements you use. Also tell them if you smoke, drink alcohol, or use illegal drugs. Some items may interact with your medicine. What should I watch for while using this medicine? You may get drowsy or dizzy. Do not drive, use machinery, or do anything that needs mental alertness until you know how this medicine affects you. Do not stand or sit up quickly, especially if you are an older patient. This reduces the risk of dizzy or fainting spells. In some cases, you may be given additional medicines to help with side effects. Follow all directions for their use. Call your doctor or health care professional for advice if you get a fever, chills or sore throat, or other symptoms of a cold or flu. Do not treat yourself. This drug decreases your body's ability to fight infections. Try to avoid being around people who are sick. This medicine may increase your risk to bruise or bleed. Call your doctor or health care professional if you notice any unusual bleeding. You may need blood work done while you are  taking this medicine. In some patients, this medicine may cause a serious brain infection that may cause death. If you have any problems seeing, thinking, speaking, walking, or standing, tell your doctor right away. If you cannot reach your doctor, urgently seek other source of medical care. Check with your doctor or health care professional if you get an attack of severe diarrhea, nausea and vomiting, or if you sweat a lot. The loss of too much body fluid can make it dangerous for you to take this medicine. Do not become pregnant while taking this medicine or for at least 7 months after stopping it. Women should inform their doctor if they wish to become pregnant or think they might be pregnant. Men should not father a child while taking this medicine and for at least 4 months after stopping it. There is a potential for serious side effects to an unborn child. Talk to your health care professional or pharmacist for more information. Do not breast-feed an infant while taking this medicine or for 2 months after stopping it. This medicine may interfere with the ability to have a child. You should talk with your doctor or  health care professional if you are concerned about your fertility. What side effects may I notice from receiving this medicine? Side effects that you should report to your doctor or health care professional as soon as possible: allergic reactions like skin rash, itching or hives, swelling of the face, lips, or tongue breathing problems changes in hearing changes in vision fast, irregular heartbeat feeling faint or lightheaded, falls pain, tingling, numbness in the hands or feet right upper belly pain seizures swelling of the ankles, feet, hands unusual bleeding or bruising unusually weak or tired vomiting yellowing of the eyes or skin Side effects that usually do not require medical attention (report to your doctor or health care professional if they continue or are  bothersome): changes in emotions or moods constipation diarrhea loss of appetite headache irritation at site where injected nausea This list may not describe all possible side effects. Call your doctor for medical advice about side effects. You may report side effects to FDA at 1-800-FDA-1088. Where should I keep my medicine? This drug is given in a hospital or clinic and will not be stored at home. NOTE: This sheet is a summary. It may not cover all possible information. If you have questions about this medicine, talk to your doctor, pharmacist, or health care provider.  2020 Elsevier/Gold Standard (2018-01-21 16:29:31)  *URINARY PROBLEMS  *BOWEL PROBLEMS  UNUSUAL RASH Items with * indicate a potential emergency and should be followed up as soon as possible.  Feel free to call the clinic should you have any questions or concerns. The clinic phone number is [phone].  Please show the [facility] at check-in to the Emergency Department and triage nurse.

## 2020-09-10 NOTE — Progress Notes (Signed)
Ok to treat with Creatinine 1.85 per Dr. Marin Olp

## 2020-09-13 ENCOUNTER — Telehealth: Payer: Self-pay

## 2020-09-13 NOTE — Telephone Encounter (Signed)
Prior Auth for Amiodarone has been submitted on CoverMyMeds KEY: [id number]

## 2020-09-15 NOTE — Telephone Encounter (Addendum)
**Note De-Identified Zyrion Coey Obfuscation** Per letter from [place] received Barbette Mcglaun fax they denied the pts Amiodarone PA. Reason: Ventricular Fibrillation is not a FDA approved diagnosis for the need of Amiodarone.  I have completed an appeal form that came with the denial letter and attached office visit notes from 03/20/2016 to it. I did indicate on the form that we changed the diagnosis to Atrial Fibrillation and advised that the pt has been taking on and off since 2013. I also wrote URGENT on the cover letter.  I emailed the form with office visit notes to April G who confirmed that she has faxed the form to [facility] for me and did receive confirmation that it was sent successfully.

## 2020-09-15 NOTE — Telephone Encounter (Signed)
I called Eric [name] back. He is confused as to why his Insurance won't cover Amiodarone. I told him that [employer] had changed the Dx on the Prior Auth and submitted an urgent appeal. I told him we would contact him as soon as we had an answer.

## 2020-09-15 NOTE — Telephone Encounter (Signed)
[facility] is calling wanting to check on the status of the prior authorization for this medication. Please advise.

## 2020-09-16 ENCOUNTER — Inpatient Hospital Stay: Payer: Medicare Other

## 2020-09-16 ENCOUNTER — Other Ambulatory Visit: Payer: Self-pay

## 2020-09-16 VITALS — BP 120/64 | HR 96 | Temp 97.9°F | Resp 18

## 2020-09-16 DIAGNOSIS — C9000 Multiple myeloma not having achieved remission: Secondary | ICD-10-CM

## 2020-09-16 DIAGNOSIS — Z5112 Encounter for antineoplastic immunotherapy: Secondary | ICD-10-CM | POA: Diagnosis not present

## 2020-09-16 LAB — COMPREHENSIVE METABOLIC PANEL
ALT: 26 U/L (ref 0–44)
AST: 27 U/L (ref 15–41)
Albumin: 4 g/dL (ref 3.5–5.0)
Alkaline Phosphatase: 66 U/L (ref 38–126)
Anion gap: 8 (ref 5–15)
BUN: 34 mg/dL — ABNORMAL HIGH (ref 8–23)
CO2: 27 mmol/L (ref 22–32)
Calcium: 9.1 mg/dL (ref 8.9–10.3)
Chloride: 109 mmol/L (ref 98–111)
Creatinine, Ser: 2.39 mg/dL — ABNORMAL HIGH (ref 0.61–1.24)
GFR, Estimated: 29 mL/min — ABNORMAL LOW (ref >60)
Glucose, Bld: 176 mg/dL — ABNORMAL HIGH (ref 70–99)
Potassium: 4 mmol/L (ref 3.5–5.1)
Sodium: 144 mmol/L (ref 135–145)
Total Bilirubin: 0.9 mg/dL (ref 0.3–1.2)
Total Protein: 5.8 g/dL — ABNORMAL LOW (ref 6.5–8.1)

## 2020-09-16 LAB — CBC WITH DIFFERENTIAL/PLATELET
Abs Immature Granulocytes: 0.02 10*3/uL (ref 0.00–0.07)
Basophils Absolute: 0 10*3/uL (ref 0.0–0.1)
Basophils Relative: 0 %
Eosinophils Absolute: 0 10*3/uL (ref 0.0–0.5)
Eosinophils Relative: 0 %
HCT: 33.7 % — ABNORMAL LOW (ref 39.0–52.0)
Hemoglobin: 10.5 g/dL — ABNORMAL LOW (ref 13.0–17.0)
Immature Granulocytes: 0 %
Lymphocytes Relative: 7 %
Lymphs Abs: 0.4 10*3/uL — ABNORMAL LOW (ref 0.7–4.0)
MCH: 28.8 pg (ref 26.0–34.0)
MCHC: 31.2 g/dL (ref 30.0–36.0)
MCV: 92.6 fL (ref 80.0–100.0)
Monocytes Absolute: 0.8 10*3/uL (ref 0.1–1.0)
Monocytes Relative: 13 %
Neutro Abs: 4.9 10*3/uL (ref 1.7–7.7)
Neutrophils Relative %: 80 %
Platelets: 117 10*3/uL — ABNORMAL LOW (ref 150–400)
RBC: 3.64 MIL/uL — ABNORMAL LOW (ref 4.22–5.81)
RDW: 19.4 % — ABNORMAL HIGH (ref 11.5–15.5)
WBC: 6.2 10*3/uL (ref 4.0–10.5)
nRBC: 0.3 % — ABNORMAL HIGH (ref 0.0–0.2)

## 2020-09-16 MED ORDER — SODIUM CHLORIDE 0.9 % IV SOLN
400.0000 mg/m2 | Freq: Once | INTRAVENOUS | Status: AC
  Administered 2020-09-16: 14:00:00 900 mg via INTRAVENOUS
  Filled 2020-09-16: qty 45

## 2020-09-16 MED ORDER — SODIUM CHLORIDE 0.9 % IV SOLN
Freq: Once | INTRAVENOUS | Status: AC
  Filled 2020-09-16: qty 250

## 2020-09-16 MED ORDER — PALONOSETRON HCL INJECTION 0.25 MG/5ML
INTRAVENOUS | Status: AC
  Filled 2020-09-16: qty 5

## 2020-09-16 MED ORDER — SODIUM CHLORIDE 0.9 % IV SOLN
20.0000 mg | Freq: Once | INTRAVENOUS | Status: AC
  Administered 2020-09-16: 13:00:00 20 mg via INTRAVENOUS
  Filled 2020-09-16: qty 20

## 2020-09-16 MED ORDER — PALONOSETRON HCL INJECTION 0.25 MG/5ML
0.2500 mg | Freq: Once | INTRAVENOUS | Status: AC
  Administered 2020-09-16: 13:00:00 0.25 mg via INTRAVENOUS

## 2020-09-16 MED ORDER — BORTEZOMIB CHEMO SQ INJECTION 3.5 MG (2.5MG/ML)
1.3000 mg/m2 | Freq: Once | INTRAMUSCULAR | Status: AC
  Administered 2020-09-16: 3 mg via SUBCUTANEOUS
  Filled 2020-09-16: qty 1.2

## 2020-09-16 NOTE — Progress Notes (Signed)
Ok to treat with creatinine 2.39 per Dr. Marin Olp

## 2020-09-16 NOTE — Patient Instructions (Signed)
[facility] Discharge Instructions for Patients Receiving Chemotherapy  Today you received the following chemotherapy agents Cytoxan/Velcade To help prevent nausea and vomiting after your treatment, we encourage you to take your nausea medication as prescribed. If you develop nausea and vomiting that is not controlled by your nausea medication, call the clinic.   BELOW ARE SYMPTOMS THAT SHOULD BE REPORTED IMMEDIATELY:  *FEVER GREATER THAN 100.5 F  *CHILLS WITH OR WITHOUT FEVER  NAUSEA AND VOMITING THAT IS NOT CONTROLLED WITH YOUR NAUSEA MEDICATION  *UNUSUAL SHORTNESS OF BREATH  *UNUSUAL BRUISING OR BLEEDING  TENDERNESS IN MOUTH AND THROAT WITH OR WITHOUT PRESENCE OF ULCERS  *URINARY PROBLEMS  *BOWEL PROBLEMS  UNUSUAL RASH Items with * indicate a potential emergency and should be followed up as soon as possible.  Feel free to call the clinic should you have any questions or concerns. The clinic phone number is [phone].  Please show the [facility] at check-in to the Emergency Department and triage nurse.

## 2020-09-21 ENCOUNTER — Ambulatory Visit (HOSPITAL_COMMUNITY): Payer: Medicare Other

## 2020-09-21 NOTE — Progress Notes (Signed)
Remote ICD transmission.   

## 2020-09-29 ENCOUNTER — Other Ambulatory Visit: Payer: Self-pay

## 2020-09-29 ENCOUNTER — Encounter: Payer: Self-pay | Admitting: Podiatry

## 2020-09-29 ENCOUNTER — Ambulatory Visit (INDEPENDENT_AMBULATORY_CARE_PROVIDER_SITE_OTHER): Payer: Medicare Other | Admitting: Podiatry

## 2020-09-29 DIAGNOSIS — L608 Other nail disorders: Secondary | ICD-10-CM

## 2020-09-29 DIAGNOSIS — M79674 Pain in right toe(s): Secondary | ICD-10-CM | POA: Diagnosis not present

## 2020-09-29 DIAGNOSIS — D689 Coagulation defect, unspecified: Secondary | ICD-10-CM | POA: Diagnosis not present

## 2020-09-29 DIAGNOSIS — B351 Tinea unguium: Secondary | ICD-10-CM

## 2020-09-29 DIAGNOSIS — E119 Type 2 diabetes mellitus without complications: Secondary | ICD-10-CM | POA: Diagnosis not present

## 2020-09-29 DIAGNOSIS — M79675 Pain in left toe(s): Secondary | ICD-10-CM

## 2020-09-29 NOTE — Progress Notes (Signed)
This patient returns to my office for at risk foot care.  This patient requires this care by a professional since this patient will be at risk due to having diabetes type 2, chronic kidney disease and coagulation defect.  Patient is taking eliquisThis patient is unable to cut nails himself since the patient cannot reach his nails.These nails are painful walking and wearing shoes.  This patient presents for at risk foot care today.  General Appearance  Alert, conversant and in no acute stress.  Vascular  Dorsalis pedis and posterior tibial  pulses are palpable  bilaterally.  Capillary return is within normal limits  bilaterally. Temperature is within normal limits  bilaterally.  Neurologic  Senn-Weinstein monofilament wire test within normal limits  bilaterally. Muscle power within normal limits bilaterally.  Nails Thick disfigured discolored nails with subungual debris  from hallux to fifth toes bilaterally. No evidence of bacterial infection or drainage bilaterally.  Orthopedic  No limitations of motion  feet .  No crepitus or effusions noted.  No bony pathology or digital deformities noted.  Skin  normotropic skin with no porokeratosis noted bilaterally.  No signs of infections or ulcers noted.     Onychomycosis  Pain in right toes  Pain in left toes  Consent was obtained for treatment procedures.   Mechanical debridement of nails 1-5  bilaterally performed with a nail nipper.  Filed with dremel without incident.    Return office visit   10 weeks                   Told patient to return for periodic foot care and evaluation due to potential at risk complications.   Cheryal Salas DPM  

## 2020-10-07 ENCOUNTER — Ambulatory Visit: Payer: Medicare Other | Admitting: Hematology & Oncology

## 2020-10-07 ENCOUNTER — Other Ambulatory Visit: Payer: Medicare Other

## 2020-10-07 ENCOUNTER — Ambulatory Visit: Payer: Medicare Other

## 2020-10-07 NOTE — Progress Notes (Signed)
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 Coronary Artery Disease  . Congestive Heart Failure  . 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 with a hx of severe aortic regurgitation, valve induced left ventricular systolic dysfunction/nonischemic cardiomyopathy, CKD stage IV, essential hypertension, type 2 diabetes, prior history of cardiac arrest due to ventricular fibrillation, CAD without angina with known significant diagonal stenosis, and November 20, 2018 underwent aortic valve replacementand aortic root replacementusing bioprosthetic valve/aortic graftandsingle-vessel coronary graftingwith saphenous vein.Subsequent sternal wound infection requiring sternal flap and prolonged antibiotics, with recent diagnosis of multiple myeloma.  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  As noted above, multiple myeloma was confirmed.  He is on infusion therapy that includes dexamethasone, Cytoxan (cyclophosphamide), and bortezomib in cycles.  He does get quite a bit of IV fluid with his infusions.  The dexamethasone will also cause fluid retention.  We had to increase his diuretic intensity to compensate as he began developing edema and shortness of breath.  On 80 mg of furosemide daily, he is doing well, weight has come down, he is not having edema, and has no orthopnea.  From cardiovascular standpoint he also denies chest pain, orthopnea, palpitations, syncope, and claudication.  He has not had blood in his urine or stool.  Past Medical History:  Diagnosis Date  . Acute blood loss anemia   . Acute encephalopathy   . Acute hypoxemic respiratory failure ([place])   . Acute idiopathic gout of right ankle   . Acute lumbar back pain   . Acute on chronic  systolic heart failure ([place])   . Acute respiratory failure ([place])   . AKI (acute kidney injury) ([place])   . Altered mental status   . Aortic aneurysm without rupture ([place]) 02/09/2016  . Aortic root enlargement ([place]) 02/13/2012  . Aortic valve regurgitation, acquired 02/13/2012  . Arrhythmia   . Atrial flutter ([place]) 03/18/2012  . Back pain   . Cardiac arrest ([place]) 02/01/2016  . CHF (congestive heart failure) ([place])   . Chronic anticoagulation 03/04/2013  . Chronic kidney disease    kidney fx studies increased   . Chronic lower back pain   . Chronic renal insufficiency   . Chronic systolic heart failure ([place]) 02/13/2012   Recent diagnosis 4 / 2013, LVEF 25% by Echo 12/2011  03/2013: Echo at [facility] Conclusions: 1. Left ventricular ejection fraction estimated by 2D at 40-45 percent. 2. Mild concentric left ventricular hypertrophy. 3. Mild left atrial enlargement. 4. Moderate aortic valve regurgitation. 5. The aortic root at the sinus(es) of valsalva is moderately dilated 6. Mild mitral valve regurgitation. 7.  . CKD (chronic kidney disease)   . CKD (chronic kidney disease), stage IV ([place]) 08/06/2013   Creatinine 2.4 on 07/04/13   . Claustrophobia   . Colon cancer screening 03/04/2013  . Debility 02/22/2016  . Diabetes mellitus type 2 in nonobese (HCC)   . Diabetes mellitus type 2 in obese ([place])   . Dysrhythmia    "palpitations"  . Encounter for central line placement   . Exertional dyspnea 01/2012  . 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 Goals of care, counseling/discussion 05/05/2020  . HCAP (  healthcare-associated pneumonia)   . Heart murmur   . Hyperlipidemia 02/13/2012  . Hypertension   . Hypothyroidism   . Internal hemorrhoids without mention of complication 4/48/1856  . Labile blood pressure   . Left bundle branch block 02/13/2012  . Leg weakness, bilateral   . Long term (current) use of anticoagulants 08/06/2013   Eliquis therapy   . Long term current use of  amiodarone 08/07/2016  . Lower extremity weakness   . Migraine 02/13/12   "opthalmic"  . Multiple myeloma ([place]) 05/05/2020  . Non-traumatic rhabdomyolysis   . Obesity (BMI 30-39.9) 02/13/2012  . Pain   . Paroxysmal atrial fibrillation (HCC)   . Pneumonia   . Retroperitoneal bleed   . Right ankle pain   . Right knee pain   . Severe aortic regurgitation 02/09/2016  . Special screening for malignant neoplasms, colon 04/11/2013  . Thyroid activity decreased   . Tibial pain   . Varicose vein of leg    right  . Ventricular fibrillation ([place]) 02/09/2016  . Weakness of both lower extremities     Past Surgical History:  Procedure Laterality Date  . AORTIC VALVE REPLACEMENT  11/20/2018  . CARDIAC CATHETERIZATION N/A 02/17/2016   Procedure: Left Heart Cath and Coronary Angiography;  Surgeon: Jettie Booze, MD;  Location: [place] CV LAB;  Service: Cardiovascular;  Laterality: N/A;  . CARDIOVERSION  03/22/2012   Procedure: CARDIOVERSION;  Surgeon: Candee Furbish, MD;  Location: [facility] ENDOSCOPY;  Service: Cardiovascular;  Laterality: N/A;  . CARDIOVERSION  04/19/2012   Procedure: CARDIOVERSION;  Surgeon: Sinclair Grooms, MD;  Location: [place];  Service: Cardiovascular;  Laterality: N/A;  . COLONOSCOPY N/A 04/11/2013   Procedure: COLONOSCOPY;  Surgeon: Inda Castle, MD;  Location: [facility] ENDOSCOPY;  Service: Endoscopy;  Laterality: N/A;  . COLONOSCOPY N/A 04/11/2013   Procedure: COLONOSCOPY;  Surgeon: Inda Castle, MD;  Location: [facility] ENDOSCOPY;  Service: Endoscopy;  Laterality: N/A;  . EP IMPLANTABLE DEVICE N/A 02/17/2016   Procedure: BiV ICD Insertion CRT-D;  Surgeon: Evans Lance, MD;  Location: [place] CV LAB;  Service: Cardiovascular;  Laterality: N/A;  . FINGER SURGERY  2012   "4th digit right hand; thumb on left hand"  . RADIOLOGY WITH ANESTHESIA N/A 02/11/2016   Procedure: MRI OF THE BRAIN WITHOUT CONTRAST, LUMBAR WITHOUT CONTRAST;  Surgeon: Medication Radiologist, MD;  Location: [place];  Service: Radiology;  Laterality: N/A;  DR. WOOD/MRI  . RIGHT/LEFT HEART CATH AND CORONARY ANGIOGRAPHY N/A 07/26/2018   Procedure: RIGHT/LEFT HEART CATH AND CORONARY ANGIOGRAPHY;  Surgeon: Belva Crome, MD;  Location: [place] CV LAB;  Service: Cardiovascular;  Laterality: N/A;  . Skin melanocytoma excision  2012   "above left clavicle"  . STERNAL INCISION RECLOSURE  11/2018  . STERNAL WIRE REMOVAL  11/2018  . STERNAL WOUND DEBRIDEMENT  11/2018  . TEE WITHOUT CARDIOVERSION  03/22/2012   Procedure: TRANSESOPHAGEAL ECHOCARDIOGRAM (TEE);  Surgeon: Candee Furbish, MD;  Location: [facility] ENDOSCOPY;  Service: Cardiovascular;  Laterality: N/A;  . TEE WITHOUT CARDIOVERSION N/A 02/08/2016   Procedure: TRANSESOPHAGEAL ECHOCARDIOGRAM (TEE);  Surgeon: Lelon Perla, MD;  Location: [facility] ENDOSCOPY;  Service: Cardiovascular;  Laterality: N/A;    Current Medications: Current Meds  Medication Sig  . acetaminophen (TYLENOL) 500 MG tablet Take 1,000 mg by mouth every 4 (four) hours as needed for moderate pain or headache.  Marland Kitchen amiodarone (PACERONE) 100 MG tablet Take 1 tablet (100 mg total) by mouth daily. Please make overdue appt  with Dr. Lovena Le before anymore refills. Thank you 1st attempt  . apixaban (ELIQUIS) 5 MG TABS tablet Take 1 tablet (5 mg total) by mouth 2 (two) times daily.  . carvedilol (COREG) 6.25 MG tablet TAKE 1 TABLET(6.25 MG) BY MOUTH TWICE DAILY  . docusate sodium (COLACE) 100 MG capsule Take 100 mg by mouth daily as needed for mild constipation.  . famciclovir (FAMVIR) 500 MG tablet Take 1 tablet (500 mg total) by mouth daily.  . fluticasone (FLONASE) 50 MCG/ACT nasal spray Place 2 sprays into the nose as needed for allergies.   . furosemide (LASIX) 80 MG tablet Take 1 tablet (80 mg total) by mouth daily.  . Hypromellose (ARTIFICIAL TEARS OP) Place 1 drop into both eyes daily as needed (dry eyes).  Marland Kitchen ipratropium (ATROVENT) 0.06 % nasal spray Place 2 sprays into both nostrils 2 (two) times  daily.  . isosorbide-hydrALAZINE (BIDIL) 20-37.5 MG tablet Take 1 tablet by mouth 3 (three) times daily.  Marland Kitchen levothyroxine (SYNTHROID) 75 MCG tablet TAKE 1 TABLET BY MOUTH DAILY IN THE MORNING EXCEPT TAKE 1/2 TABLET ON SUNDAYS IN THE MORNING  . loperamide (IMODIUM) 2 MG capsule Take 2 mg by mouth as needed for diarrhea or loose stools.  . ondansetron (ZOFRAN) 8 MG tablet Take 1 tablet (8 mg total) by mouth 2 (two) times daily as needed for refractory nausea / vomiting. Start on day 3 after Cytoxan.  . potassium chloride 20 MEQ/15ML (10%) SOLN Take 15 mLs (20 mEq total) by mouth 2 (two) times daily. (Patient taking differently: Take 20 mEq by mouth daily.)  . prochlorperazine (COMPAZINE) 10 MG tablet Take 1 tablet (10 mg total) by mouth every 6 (six) hours as needed (Nausea or vomiting).  . rosuvastatin (CRESTOR) 20 MG tablet Take 1 tablet (20 mg total) by mouth daily.  . vitamin B-12 (CYANOCOBALAMIN) 100 MCG tablet Take 100 mcg by mouth daily.  . vitamin C (ASCORBIC ACID) 500 MG tablet Take 500 mg by mouth as needed.   Marland Kitchen VITAMIN D PO Take 1 capsule by mouth daily.     Allergies:   Lactose intolerance (gi)   Social History   Socioeconomic History  . Marital status: Married    Spouse name: [name]  . Number of children: 1  . Years of education: Not on file  . Highest education level: Not on file  Occupational History    Employer: [employer]  Tobacco Use  . Smoking status: Never Smoker  . Smokeless tobacco: Never Used  Vaping Use  . Vaping Use: Never used  Substance and Sexual Activity  . Alcohol use: Yes    Comment: rarely  . Drug use: No  . Sexual activity: Not Currently  Other Topics Concern  . 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   Social Determinants of Health   Financial Resource Strain: Low Risk   . Difficulty of Paying Living Expenses: Not hard at all  Food Insecurity: No Food  Insecurity  . Worried About Charity fundraiser in the Last Year: Never true  . Ran Out of Food in the Last Year: Never true  Transportation Needs: No Transportation Needs  . Lack of Transportation (Medical): No  . Lack of Transportation (Non-Medical): No  Physical Activity: Inactive  . Days of Exercise per Week: 0 days  . Minutes of Exercise per Session: 0 min  Stress: No Stress Concern  Present  . Feeling of Stress : Not at all  Social Connections: Not on file     Family History: The patient's family history includes Diabetes in his mother; Heart disease in his father and mother; Heart failure in his father and mother; Hypertension in his father and mother.  ROS:   Please see the history of present illness.    He is working part-time.  He denies palpitations.  His appetite is stable.  He is optimistic about his outcome with multiple myeloma.  Dr. Scherrie Merritts is his oncologist.  All other systems reviewed and are negative.  EKGs/Labs/Other Studies Reviewed:    The following studies were reviewed today: No recent or new cardiac data  EKG:  EKG ventricular paced rhythm.  Possible underlying atrial flutter or perhaps even atrial tracking.  Rate is 71.  Unchanged from prior tracing performed 06/04/2019.   Recent Labs: 08/26/2020: TSH 2.120 10/08/2020: ALT 19; BUN 23; Creatinine, Ser 1.80; Hemoglobin 10.8; Platelets 150; Potassium 3.7; Sodium 140  Recent Lipid Panel    Component Value Date/Time   CHOL 121 05/13/2019 1549   TRIG 48 05/13/2019 1549   HDL 49 05/13/2019 1549   CHOLHDL 2.5 05/13/2019 1549   CHOLHDL 3.8 02/13/2012 1829   VLDL 30 02/13/2012 1829   LDLCALC 62 05/13/2019 1549    Physical Exam:    VS:  BP 132/82   Pulse 71   Ht '5\' 9"'  (1.753 m)   Wt 231 lb 6 oz (105 kg)   BMI 34.17 kg/m     Wt Readings from Last 3 Encounters:  10/08/20 231 lb 6 oz (105 kg)  10/08/20 228 lb (103.4 kg)  09/02/20 233 lb (105.7 kg)     GEN: Obese. No acute distress HEENT:  Normal NECK: No JVD. LYMPHATICS: No lymphadenopathy CARDIAC: Apical systolic murmur. RRR no gallop, or edema. VASCULAR:  Normal Pulses. No bruits. RESPIRATORY:  Clear to auscultation without rales, wheezing or rhonchi  ABDOMEN: Soft, non-tender, non-distended, No pulsatile mass, MUSCULOSKELETAL: No deformity  SKIN: Warm and dry NEUROLOGIC:  Alert and oriented x 3 PSYCHIATRIC:  Normal affect   ASSESSMENT:    1. Chronic systolic heart failure ([place])   2. S/P AVR (aortic valve replacement)   3. CKD (chronic kidney disease), stage IV (HCC)   4. Cardiac arrest ([place])   5. Persistent atrial fibrillation ([place])   6. Essential hypertension   7. High risk medication use   8. Long term current use of anticoagulant   9. Educated about COVID-19 virus infection    PLAN:    In order of problems listed above:  1. Current heart failure regimen seems to be holding him stable and includes carvedilol 6.25 mg twice daily furosemide, and BiDil.  An SGLT2 is probably needed and could provide cardio and renal protection.  I will mention this to him. 2. Auscultatory evaluation suggest normal valve function. 3. Kidney function is CKD stage IV.  Consider adding SGLT2 therapy. 4. No recurrence.  Has a defibrillator. 5. On amiodarone therapy, 100 mg/day.  I believe he is atrial tracking on EKG. 6. Blood pressure is adequately controlled on the heart failure regimen. 7. Liver, thyroid, and chest x-ray follow-up need to be done this year. 8. Continue Eliquis at the reduced dose if necessary with current kidney function which needs to be monitored closely.  Most recent creatinine is 1.8.  This was performed today.  No changes needed. 10. Vaccinated, boosted, and practicing social mitigation.  Clinical follow-up in 6  months.   Medication Adjustments/Labs and Tests Ordered: Current medicines are reviewed at length with the patient today.  Concerns regarding medicines are outlined above.  Orders Placed This  Encounter  Procedures  . EKG 12-Lead   No orders of the defined types were placed in this encounter.   Patient Instructions  Medication Instructions:  Your physician recommends that you continue on your current medications as directed. Please refer to the Current Medication list given to you today.  *If you need a refill on your cardiac medications before your next appointment, please call your pharmacy*   Lab Work: None If you have labs (blood work) drawn today and your tests are completely normal, you will receive your results only by: Marland Kitchen MyChart Message (if you have MyChart) OR . A paper copy in the mail If you have any lab test that is abnormal or we need to change your treatment, we will call you to review the results.   Testing/Procedures: None   Follow-Up: At [facility], you and your health needs are our priority.  As part of our continuing mission to provide you with exceptional heart care, we have created designated Provider Care Teams.  These Care Teams include your primary Cardiologist (physician) and Advanced Practice Providers (APPs -  Physician Assistants and Nurse Practitioners) who all work together to provide you with the care you need, when you need it.  We recommend signing up for the patient portal called "MyChart".  Sign up information is provided on this After Visit Summary.  MyChart is used to connect with patients for Virtual Visits (Telemedicine).  Patients are able to view lab/test results, encounter notes, upcoming appointments, etc.  Non-urgent messages can be sent to your provider as well.   To learn more about what you can do with MyChart, go to [email or web address].    Your next appointment:   6 month(s)  The format for your next appointment:   In Person  Provider:   You may see Sinclair Grooms, MD or one of the following Advanced Practice Providers on your designated Care Team:    Cecilie Kicks, NP  Kathyrn Drown, NP    Other  Instructions      Signed, Sinclair Grooms, MD  10/08/2020 5:13 PM    [place]

## 2020-10-08 ENCOUNTER — Inpatient Hospital Stay (HOSPITAL_BASED_OUTPATIENT_CLINIC_OR_DEPARTMENT_OTHER): Payer: Medicare Other | Admitting: Hematology & Oncology

## 2020-10-08 ENCOUNTER — Inpatient Hospital Stay: Payer: Medicare Other | Attending: Hematology & Oncology

## 2020-10-08 ENCOUNTER — Telehealth: Payer: Self-pay

## 2020-10-08 ENCOUNTER — Encounter: Payer: Self-pay | Admitting: Hematology & Oncology

## 2020-10-08 ENCOUNTER — Encounter: Payer: Self-pay | Admitting: Interventional Cardiology

## 2020-10-08 ENCOUNTER — Inpatient Hospital Stay: Payer: Medicare Other

## 2020-10-08 ENCOUNTER — Other Ambulatory Visit: Payer: Self-pay

## 2020-10-08 ENCOUNTER — Ambulatory Visit (INDEPENDENT_AMBULATORY_CARE_PROVIDER_SITE_OTHER): Payer: Medicare Other | Admitting: Interventional Cardiology

## 2020-10-08 VITALS — BP 111/73 | HR 50 | Temp 98.1°F | Resp 19 | Wt 228.0 lb

## 2020-10-08 VITALS — BP 132/82 | HR 71 | Ht 69.0 in | Wt 231.4 lb

## 2020-10-08 DIAGNOSIS — I5022 Chronic systolic (congestive) heart failure: Secondary | ICD-10-CM

## 2020-10-08 DIAGNOSIS — C9000 Multiple myeloma not having achieved remission: Secondary | ICD-10-CM

## 2020-10-08 DIAGNOSIS — I4819 Other persistent atrial fibrillation: Secondary | ICD-10-CM | POA: Diagnosis not present

## 2020-10-08 DIAGNOSIS — I10 Essential (primary) hypertension: Secondary | ICD-10-CM

## 2020-10-08 DIAGNOSIS — Z952 Presence of prosthetic heart valve: Secondary | ICD-10-CM | POA: Diagnosis not present

## 2020-10-08 DIAGNOSIS — E119 Type 2 diabetes mellitus without complications: Secondary | ICD-10-CM | POA: Diagnosis not present

## 2020-10-08 DIAGNOSIS — I469 Cardiac arrest, cause unspecified: Secondary | ICD-10-CM

## 2020-10-08 DIAGNOSIS — Z79899 Other long term (current) drug therapy: Secondary | ICD-10-CM

## 2020-10-08 DIAGNOSIS — Z7189 Other specified counseling: Secondary | ICD-10-CM | POA: Diagnosis not present

## 2020-10-08 DIAGNOSIS — Z5112 Encounter for antineoplastic immunotherapy: Secondary | ICD-10-CM | POA: Insufficient documentation

## 2020-10-08 DIAGNOSIS — Z7901 Long term (current) use of anticoagulants: Secondary | ICD-10-CM | POA: Diagnosis not present

## 2020-10-08 DIAGNOSIS — Z5111 Encounter for antineoplastic chemotherapy: Secondary | ICD-10-CM | POA: Insufficient documentation

## 2020-10-08 DIAGNOSIS — N184 Chronic kidney disease, stage 4 (severe): Secondary | ICD-10-CM

## 2020-10-08 LAB — CBC WITH DIFFERENTIAL/PLATELET
Abs Immature Granulocytes: 0.01 10*3/uL (ref 0.00–0.07)
Basophils Absolute: 0 10*3/uL (ref 0.0–0.1)
Basophils Relative: 1 %
Eosinophils Absolute: 0 10*3/uL (ref 0.0–0.5)
Eosinophils Relative: 1 %
HCT: 32.8 % — ABNORMAL LOW (ref 39.0–52.0)
Hemoglobin: 10.8 g/dL — ABNORMAL LOW (ref 13.0–17.0)
Immature Granulocytes: 0 %
Lymphocytes Relative: 12 %
Lymphs Abs: 0.5 10*3/uL — ABNORMAL LOW (ref 0.7–4.0)
MCH: 29.3 pg (ref 26.0–34.0)
MCHC: 32.9 g/dL (ref 30.0–36.0)
MCV: 89.1 fL (ref 80.0–100.0)
Monocytes Absolute: 0.8 10*3/uL (ref 0.1–1.0)
Monocytes Relative: 21 %
Neutro Abs: 2.6 10*3/uL (ref 1.7–7.7)
Neutrophils Relative %: 65 %
Platelets: 150 10*3/uL (ref 150–400)
RBC: 3.68 MIL/uL — ABNORMAL LOW (ref 4.22–5.81)
RDW: 19.7 % — ABNORMAL HIGH (ref 11.5–15.5)
WBC: 4 10*3/uL (ref 4.0–10.5)
nRBC: 0 % (ref 0.0–0.2)

## 2020-10-08 LAB — COMPREHENSIVE METABOLIC PANEL
ALT: 19 U/L (ref 0–44)
AST: 23 U/L (ref 15–41)
Albumin: 3.9 g/dL (ref 3.5–5.0)
Alkaline Phosphatase: 63 U/L (ref 38–126)
Anion gap: 8 (ref 5–15)
BUN: 23 mg/dL (ref 8–23)
CO2: 26 mmol/L (ref 22–32)
Calcium: 9.3 mg/dL (ref 8.9–10.3)
Chloride: 106 mmol/L (ref 98–111)
Creatinine, Ser: 1.8 mg/dL — ABNORMAL HIGH (ref 0.61–1.24)
GFR, Estimated: 40 mL/min — ABNORMAL LOW (ref >60)
Glucose, Bld: 151 mg/dL — ABNORMAL HIGH (ref 70–99)
Potassium: 3.7 mmol/L (ref 3.5–5.1)
Sodium: 140 mmol/L (ref 135–145)
Total Bilirubin: 0.9 mg/dL (ref 0.3–1.2)
Total Protein: 5.6 g/dL — ABNORMAL LOW (ref 6.5–8.1)

## 2020-10-08 MED ORDER — PALONOSETRON HCL INJECTION 0.25 MG/5ML
0.2500 mg | Freq: Once | INTRAVENOUS | Status: AC
  Administered 2020-10-08: 0.25 mg via INTRAVENOUS

## 2020-10-08 MED ORDER — SODIUM CHLORIDE 0.9 % IV SOLN
20.0000 mg | Freq: Once | INTRAVENOUS | Status: AC
  Administered 2020-10-08: 20 mg via INTRAVENOUS
  Filled 2020-10-08: qty 2

## 2020-10-08 MED ORDER — BORTEZOMIB CHEMO SQ INJECTION 3.5 MG (2.5MG/ML)
1.3000 mg/m2 | Freq: Once | INTRAMUSCULAR | Status: AC
  Administered 2020-10-08: 3 mg via SUBCUTANEOUS
  Filled 2020-10-08: qty 1.2

## 2020-10-08 MED ORDER — SODIUM CHLORIDE 0.9 % IV SOLN
Freq: Once | INTRAVENOUS | Status: AC
  Filled 2020-10-08: qty 250

## 2020-10-08 MED ORDER — PALONOSETRON HCL INJECTION 0.25 MG/5ML
INTRAVENOUS | Status: AC
  Filled 2020-10-08: qty 5

## 2020-10-08 MED ORDER — SODIUM CHLORIDE 0.9 % IV SOLN
400.0000 mg/m2 | Freq: Once | INTRAVENOUS | Status: AC
  Administered 2020-10-08: 900 mg via INTRAVENOUS
  Filled 2020-10-08: qty 45

## 2020-10-08 NOTE — Progress Notes (Signed)
Hematology and Oncology Follow Up Visit  Eric Hamza., MD [id number] 01-Mar-1951 70 y.o. 10/08/2020   Principle Diagnosis:   IgG kappa myeloma  - 1q+  Current Therapy:    CyBorD -- s/p cycle #4  - start on 06/04/2020     Interim History:  Eric [name] is back for follow-up.  He did go ahead and have a nice Christmas and New Year's.  Unfortunately, he postpone his bone marrow biopsy.  I really would like to have this done.  He just did not give any reason for delaying this.  Now will be done the end of January.  He still has had a response to the treatment.  His M spike in December was 0.1 g/dL.  The IgG level was 460 mg/dL.  The kappa light chain was 1.9 mg/dL.    He is still working.  He still operates with respect to ophthalmology.  Unfortunately, his wife is not doing well.  She has diabetes.  She has complications from the diabetes.  I really do not think that he is going to be a candidate for transplant.  I does think there is too much going on with respect to his life to be able to make the commitment for transplant.  He is not hurting.  He has had no cough or shortness of breath.  He has had no change in bowel or bladder habits.  He has had no issues with fever.  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     Medications:  Current Outpatient Medications:  .  acetaminophen (TYLENOL) 500 MG tablet, Take 1,000 mg by mouth every 4 (four) hours as needed for moderate pain or headache., Disp: , Rfl:  .  amiodarone (PACERONE) 100 MG tablet, Take 1 tablet (100 mg total) by mouth daily. Please make overdue appt with Dr. Lovena Le before anymore refills. Thank you 1st attempt, Disp: 30 tablet, Rfl: 0 .  apixaban (ELIQUIS) 5 MG TABS tablet, Take 1 tablet (5 mg total) by mouth 2 (two) times daily., Disp: 60 tablet, Rfl: 5 .  carvedilol (COREG) 6.25 MG tablet, TAKE 1 TABLET(6.25 MG) BY MOUTH TWICE DAILY, Disp: 180 tablet, Rfl: 1 .  docusate sodium (COLACE) 100 MG capsule, Take 100 mg by  mouth daily as needed for mild constipation., Disp: , Rfl:  .  famciclovir (FAMVIR) 500 MG tablet, Take 1 tablet (500 mg total) by mouth daily., Disp: 30 tablet, Rfl: 3 .  fluticasone (FLONASE) 50 MCG/ACT nasal spray, Place 2 sprays into the nose as needed for allergies. , Disp: , Rfl:  .  furosemide (LASIX) 80 MG tablet, Take 1 tablet (80 mg total) by mouth daily., Disp: 90 tablet, Rfl: 3 .  Hypromellose (ARTIFICIAL TEARS OP), Place 1 drop into both eyes daily as needed (dry eyes)., Disp: , Rfl:  .  ipratropium (ATROVENT) 0.06 % nasal spray, Place 2 sprays into both nostrils 2 (two) times daily., Disp: 15 mL, Rfl: 3 .  isosorbide-hydrALAZINE (BIDIL) 20-37.5 MG tablet, Take 1 tablet by mouth 3 (three) times daily., Disp: 270 tablet, Rfl: 3 .  levothyroxine (SYNTHROID) 75 MCG tablet, TAKE 1 TABLET BY MOUTH DAILY IN THE MORNING EXCEPT TAKE 1/2 TABLET ON SUNDAYS IN THE MORNING, Disp: 90 tablet, Rfl: 0 .  loperamide (IMODIUM) 2 MG capsule, Take 2 mg by mouth as needed for diarrhea or loose stools., Disp: , Rfl:  .  ondansetron (ZOFRAN) 8 MG tablet, Take 1 tablet (8 mg total) by mouth 2 (two) times daily  as needed for refractory nausea / vomiting. Start on day 3 after Cytoxan., Disp: 30 tablet, Rfl: 1 .  potassium chloride 20 MEQ/15ML (10%) SOLN, Take 15 mLs (20 mEq total) by mouth 2 (two) times daily. (Patient taking differently: Take 20 mEq by mouth daily.), Disp: 946 mL, Rfl: 6 .  prochlorperazine (COMPAZINE) 10 MG tablet, Take 1 tablet (10 mg total) by mouth every 6 (six) hours as needed (Nausea or vomiting)., Disp: 30 tablet, Rfl: 1 .  rosuvastatin (CRESTOR) 20 MG tablet, Take 1 tablet (20 mg total) by mouth daily., Disp: 90 tablet, Rfl: 2 .  vitamin B-12 (CYANOCOBALAMIN) 100 MCG tablet, Take 100 mcg by mouth daily., Disp: , Rfl:  .  vitamin C (ASCORBIC ACID) 500 MG tablet, Take 500 mg by mouth as needed. , Disp: , Rfl:  .  VITAMIN D PO, Take 1 capsule by mouth daily., Disp: , Rfl:   Allergies:   Allergies  Allergen Reactions  . Lactose Intolerance (Gi) Diarrhea    Past Medical History, Surgical history, Social history, and Family History were reviewed and updated.  Review of Systems: Review of Systems  Constitutional: Negative.   HENT:  Negative.   Eyes: Negative.   Respiratory: Negative.   Cardiovascular: Negative.   Gastrointestinal: Negative.   Endocrine: Negative.   Genitourinary: Negative.    Musculoskeletal: Negative.   Skin: Negative.   Neurological: Negative.   Hematological: Negative.   Psychiatric/Behavioral: Negative.     Physical Exam:  weight is 228 lb (103.4 kg). His oral temperature is 98.1 F (36.7 C). His blood pressure is 111/73 and his pulse is 50 (abnormal). His respiration is 19 and oxygen saturation is 98%.   Wt Readings from Last 3 Encounters:  10/08/20 228 lb (103.4 kg)  09/02/20 233 lb (105.7 kg)  08/26/20 232 lb 9.6 oz (105.5 kg)    Physical Exam Vitals reviewed.  HENT:     Head: Normocephalic and atraumatic.  Eyes:     Pupils: Pupils are equal, round, and reactive to light.  Cardiovascular:     Rate and Rhythm: Normal rate and regular rhythm.     Heart sounds: Normal heart sounds.  Pulmonary:     Effort: Pulmonary effort is normal.     Breath sounds: Normal breath sounds.  Abdominal:     General: Bowel sounds are normal.     Palpations: Abdomen is soft.  Musculoskeletal:        General: No tenderness or deformity. Normal range of motion.     Cervical back: Normal range of motion.  Lymphadenopathy:     Cervical: No cervical adenopathy.  Skin:    General: Skin is warm and dry.     Findings: No erythema or rash.  Neurological:     Mental Status: He is alert and oriented to person, place, and time.  Psychiatric:        Behavior: Behavior normal.        Thought Content: Thought content normal.        Judgment: Judgment normal.      Lab Results  Component Value Date   WBC 6.2 09/16/2020   HGB 10.5 (L) 09/16/2020    HCT 33.7 (L) 09/16/2020   MCV 92.6 09/16/2020   PLT 117 (L) 09/16/2020     Chemistry      Component Value Date/Time   NA 144 09/16/2020 1212   NA 146 (H) 07/30/2020 1555   K 4.0 09/16/2020 1212   CL 109 09/16/2020 1212  CO2 27 09/16/2020 1212   BUN 34 (H) 09/16/2020 1212   BUN 28 (H) 07/30/2020 1555   CREATININE 2.39 (H) 09/16/2020 1212   CREATININE 1.85 (H) 09/02/2020 1411   CREATININE 1.95 (H) 09/08/2016 0859      Component Value Date/Time   CALCIUM 9.1 09/16/2020 1212   ALKPHOS 66 09/16/2020 1212   AST 27 09/16/2020 1212   AST 28 09/02/2020 1411   ALT 26 09/16/2020 1212   ALT 27 09/02/2020 1411   BILITOT 0.9 09/16/2020 1212   BILITOT 0.8 09/02/2020 1411      Impression and Plan: Eric [name] is a very nice 70 year old African-American male.  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  He has IgG kappa myeloma.  I do not think that he has bad cytogenetics or FISH.  Ultimately, we will have to see what the bone marrow biopsy shows.  I hope that he will have this done when schedule in late January.  Again, I just do not think that he is going to be a candidate for transplant.  I just wonder how well he would tolerate transplant.  Again he just has quite a bit going on with his life.  His wife is not doing well.  I am not sure if he would get good care during the transplant.  I talked him about this.  He does seem to understand.  We will proceed with his fifth cycle of treatment.  I will plan to see him back in another month.    Volanda Napoleon, MD 1/14/20228:24 AM

## 2020-10-08 NOTE — Telephone Encounter (Signed)
appts made per 1.14.22 los and pt will get sch in tx/avs    aom

## 2020-10-08 NOTE — Patient Instructions (Signed)
[facility] Discharge Instructions for Patients Receiving Chemotherapy  Today you received the following chemotherapy agents Cytoxan/Velcade To help prevent nausea and vomiting after your treatment, we encourage you to take your nausea medication as prescribed. If you develop nausea and vomiting that is not controlled by your nausea medication, call the clinic.   BELOW ARE SYMPTOMS THAT SHOULD BE REPORTED IMMEDIATELY:  *FEVER GREATER THAN 100.5 F  *CHILLS WITH OR WITHOUT FEVER  NAUSEA AND VOMITING THAT IS NOT CONTROLLED WITH YOUR NAUSEA MEDICATION  *UNUSUAL SHORTNESS OF BREATH  *UNUSUAL BRUISING OR BLEEDING  TENDERNESS IN MOUTH AND THROAT WITH OR WITHOUT PRESENCE OF ULCERS  *URINARY PROBLEMS  *BOWEL PROBLEMS  UNUSUAL RASH Items with * indicate a potential emergency and should be followed up as soon as possible.  Feel free to call the clinic should you have any questions or concerns. The clinic phone number is [phone].  Please show the [facility] at check-in to the Emergency Department and triage nurse.

## 2020-10-08 NOTE — Progress Notes (Signed)
OK to treat today with creat-1.8 per order of Dr. Marin Olp.

## 2020-10-08 NOTE — Patient Instructions (Signed)
Medication Instructions:  Your physician recommends that you continue on your current medications as directed. Please refer to the Current Medication list given to you today.  *If you need a refill on your cardiac medications before your next appointment, please call your pharmacy*   Lab Work: None If you have labs (blood work) drawn today and your tests are completely normal, you will receive your results only by: Marland Kitchen MyChart Message (if you have MyChart) OR . A paper copy in the mail If you have any lab test that is abnormal or we need to change your treatment, we will call you to review the results.   Testing/Procedures: None   Follow-Up: At [facility], you and your health needs are our priority.  As part of our continuing mission to provide you with exceptional heart care, we have created designated Provider Care Teams.  These Care Teams include your primary Cardiologist (physician) and Advanced Practice Providers (APPs -  Physician Assistants and Nurse Practitioners) who all work together to provide you with the care you need, when you need it.  We recommend signing up for the patient portal called "MyChart".  Sign up information is provided on this After Visit Summary.  MyChart is used to connect with patients for Virtual Visits (Telemedicine).  Patients are able to view lab/test results, encounter notes, upcoming appointments, etc.  Non-urgent messages can be sent to your provider as well.   To learn more about what you can do with MyChart, go to [email or web address].    Your next appointment:   6 month(s)  The format for your next appointment:   In Person  Provider:   You may see Sinclair Grooms, MD or one of the following Advanced Practice Providers on your designated Care Team:    Cecilie Kicks, NP  Kathyrn Drown, NP    Other Instructions

## 2020-10-12 ENCOUNTER — Telehealth: Payer: Self-pay | Admitting: Internal Medicine

## 2020-10-12 ENCOUNTER — Other Ambulatory Visit: Payer: Self-pay | Admitting: Internal Medicine

## 2020-10-12 NOTE — Telephone Encounter (Signed)
    Pt would like to speak with Dr. Taylor's nurse 

## 2020-10-13 NOTE — Telephone Encounter (Signed)
Left message to call back  

## 2020-10-14 ENCOUNTER — Other Ambulatory Visit: Payer: Medicare Other

## 2020-10-14 ENCOUNTER — Telehealth: Payer: Self-pay

## 2020-10-14 ENCOUNTER — Ambulatory Visit: Payer: Medicare Other

## 2020-10-14 NOTE — Telephone Encounter (Signed)
Patient called to verify 1-21 appts due to potential weather,aware at this point it is business as ususl per [employer]

## 2020-10-15 ENCOUNTER — Inpatient Hospital Stay: Payer: Medicare Other

## 2020-10-15 ENCOUNTER — Other Ambulatory Visit: Payer: Self-pay

## 2020-10-15 VITALS — BP 118/67 | HR 78 | Temp 98.4°F | Resp 17

## 2020-10-15 DIAGNOSIS — Z5112 Encounter for antineoplastic immunotherapy: Secondary | ICD-10-CM | POA: Diagnosis not present

## 2020-10-15 DIAGNOSIS — Z79899 Other long term (current) drug therapy: Secondary | ICD-10-CM | POA: Diagnosis not present

## 2020-10-15 DIAGNOSIS — C9000 Multiple myeloma not having achieved remission: Secondary | ICD-10-CM

## 2020-10-15 DIAGNOSIS — Z5111 Encounter for antineoplastic chemotherapy: Secondary | ICD-10-CM | POA: Diagnosis not present

## 2020-10-15 DIAGNOSIS — E119 Type 2 diabetes mellitus without complications: Secondary | ICD-10-CM | POA: Diagnosis not present

## 2020-10-15 LAB — CBC WITH DIFFERENTIAL/PLATELET
Abs Immature Granulocytes: 0.03 10*3/uL (ref 0.00–0.07)
Basophils Absolute: 0 10*3/uL (ref 0.0–0.1)
Basophils Relative: 0 %
Eosinophils Absolute: 0.1 10*3/uL (ref 0.0–0.5)
Eosinophils Relative: 1 %
HCT: 33.4 % — ABNORMAL LOW (ref 39.0–52.0)
Hemoglobin: 10.9 g/dL — ABNORMAL LOW (ref 13.0–17.0)
Immature Granulocytes: 1 %
Lymphocytes Relative: 9 %
Lymphs Abs: 0.6 10*3/uL — ABNORMAL LOW (ref 0.7–4.0)
MCH: 29.5 pg (ref 26.0–34.0)
MCHC: 32.6 g/dL (ref 30.0–36.0)
MCV: 90.3 fL (ref 80.0–100.0)
Monocytes Absolute: 1.1 10*3/uL — ABNORMAL HIGH (ref 0.1–1.0)
Monocytes Relative: 18 %
Neutro Abs: 4.4 10*3/uL (ref 1.7–7.7)
Neutrophils Relative %: 71 %
Platelets: 110 10*3/uL — ABNORMAL LOW (ref 150–400)
RBC: 3.7 MIL/uL — ABNORMAL LOW (ref 4.22–5.81)
RDW: 19.7 % — ABNORMAL HIGH (ref 11.5–15.5)
WBC: 6.2 10*3/uL (ref 4.0–10.5)
nRBC: 0 % (ref 0.0–0.2)

## 2020-10-15 LAB — COMPREHENSIVE METABOLIC PANEL
ALT: 22 U/L (ref 0–44)
AST: 23 U/L (ref 15–41)
Albumin: 3.9 g/dL (ref 3.5–5.0)
Alkaline Phosphatase: 67 U/L (ref 38–126)
Anion gap: 7 (ref 5–15)
BUN: 29 mg/dL — ABNORMAL HIGH (ref 8–23)
CO2: 27 mmol/L (ref 22–32)
Calcium: 9.3 mg/dL (ref 8.9–10.3)
Chloride: 108 mmol/L (ref 98–111)
Creatinine, Ser: 1.96 mg/dL — ABNORMAL HIGH (ref 0.61–1.24)
GFR, Estimated: 37 mL/min — ABNORMAL LOW (ref >60)
Glucose, Bld: 158 mg/dL — ABNORMAL HIGH (ref 70–99)
Potassium: 3.8 mmol/L (ref 3.5–5.1)
Sodium: 142 mmol/L (ref 135–145)
Total Bilirubin: 0.8 mg/dL (ref 0.3–1.2)
Total Protein: 5.7 g/dL — ABNORMAL LOW (ref 6.5–8.1)

## 2020-10-15 MED ORDER — SODIUM CHLORIDE 0.9 % IV SOLN
20.0000 mg | Freq: Once | INTRAVENOUS | Status: AC
  Administered 2020-10-15: 20 mg via INTRAVENOUS
  Filled 2020-10-15: qty 20

## 2020-10-15 MED ORDER — PALONOSETRON HCL INJECTION 0.25 MG/5ML
INTRAVENOUS | Status: AC
  Filled 2020-10-15: qty 5

## 2020-10-15 MED ORDER — BORTEZOMIB CHEMO SQ INJECTION 3.5 MG (2.5MG/ML)
1.3000 mg/m2 | Freq: Once | INTRAMUSCULAR | Status: AC
  Administered 2020-10-15: 3 mg via SUBCUTANEOUS
  Filled 2020-10-15: qty 1.2

## 2020-10-15 MED ORDER — SODIUM CHLORIDE 0.9 % IV SOLN
400.0000 mg/m2 | Freq: Once | INTRAVENOUS | Status: AC
  Administered 2020-10-15: 900 mg via INTRAVENOUS
  Filled 2020-10-15: qty 45

## 2020-10-15 MED ORDER — SODIUM CHLORIDE 0.9 % IV SOLN
Freq: Once | INTRAVENOUS | Status: AC
  Filled 2020-10-15: qty 250

## 2020-10-15 MED ORDER — PALONOSETRON HCL INJECTION 0.25 MG/5ML
0.2500 mg | Freq: Once | INTRAVENOUS | Status: AC
  Administered 2020-10-15: 0.25 mg via INTRAVENOUS

## 2020-10-15 NOTE — Patient Instructions (Signed)
Bortezomib injection What is this medicine? BORTEZOMIB (bor TEZ oh mib) targets proteins in cancer cells and stops the cancer cells from growing. It treats multiple myeloma and mantle cell lymphoma. This medicine may be used for other purposes; ask your health care provider or pharmacist if you have questions. COMMON BRAND NAME(S): Velcade What should I tell my health care provider before I take this medicine? They need to know if you have any of these conditions:  dehydration  diabetes (high blood sugar)  heart disease  liver disease  tingling of the fingers or toes or other nerve disorder  an unusual or allergic reaction to bortezomib, mannitol, boron, other medicines, foods, dyes, or preservatives  pregnant or trying to get pregnant  breast-feeding How should I use this medicine? This medicine is injected into a vein or under the skin. It is given by a health care provider in a hospital or clinic setting. Talk to your health care provider about the use of this medicine in children. Special care may be needed. Overdosage: If you think you have taken too much of this medicine contact a poison control center or emergency room at once. NOTE: This medicine is only for you. Do not share this medicine with others. What if I miss a dose? Keep appointments for follow-up doses. It is important not to miss your dose. Call your health care provider if you are unable to keep an appointment. What may interact with this medicine? This medicine may interact with the following medications:  ketoconazole  rifampin This list may not describe all possible interactions. Give your health care provider a list of all the medicines, herbs, non-prescription drugs, or dietary supplements you use. Also tell them if you smoke, drink alcohol, or use illegal drugs. Some items may interact with your medicine. What should I watch for while using this medicine? Your condition will be monitored carefully while  you are receiving this medicine. You may need blood work done while you are taking this medicine. You may get drowsy or dizzy. Do not drive, use machinery, or do anything that needs mental alertness until you know how this medicine affects you. Do not stand up or sit up quickly, especially if you are an older patient. This reduces the risk of dizzy or fainting spells This medicine may increase your risk of getting an infection. Call your health care provider for advice if you get a fever, chills, sore throat, or other symptoms of a cold or flu. Do not treat yourself. Try to avoid being around people who are sick. Check with your health care provider if you have severe diarrhea, nausea, and vomiting, or if you sweat a lot. The loss of too much body fluid may make it dangerous for you to take this medicine. Do not become pregnant while taking this medicine or for 7 months after stopping it. Women should inform their health care provider if they wish to become pregnant or think they might be pregnant. Men should not father a child while taking this medicine and for 4 months after stopping it. There is a potential for serious harm to an unborn child. Talk to your health care provider for more information. Do not breast-feed an infant while taking this medicine or for 2 months after stopping it. This medicine may make it more difficult to get pregnant or father a child. Talk to your health care provider if you are concerned about your fertility. What side effects may I notice from receiving this medicine?   Side effects that you should report to your doctor or health care professional as soon as possible:  allergic reactions (skin rash; itching or hives; swelling of the face, lips, or tongue)  bleeding (bloody or black, tarry stools; red or dark brown urine; spitting up blood or brown material that looks like coffee grounds; red spots on the skin; unusual bruising or bleeding from the eye, gums, or  nose)  blurred vision or changes in vision  confusion  constipation  headache  heart failure (trouble breathing; fast, irregular heartbeat; sudden weight gain; swelling of the ankles, feet, hands)  infection (fever, chills, cough, sore throat, pain or trouble passing urine)  lack or loss of appetite  liver injury (dark yellow or brown urine; general ill feeling or flu-like symptoms; loss of appetite, right upper belly pain; yellowing of the eyes or skin)  low blood pressure (dizziness; feeling faint or lightheaded, falls; unusually weak or tired)  muscle cramps  pain, redness, or irritation at site where injected  pain, tingling, numbness in the hands or feet  seizures  trouble breathing  unusual bruising or bleeding Side effects that usually do not require medical attention (report to your doctor or health care professional if they continue or are bothersome):  diarrhea  nausea  stomach pain  trouble sleeping  vomiting This list may not describe all possible side effects. Call your doctor for medical advice about side effects. You may report side effects to FDA at 1-800-FDA-1088. Where should I keep my medicine? This medicine is given in a hospital or clinic. It will not be stored at home. NOTE: This sheet is a summary. It may not cover all possible information. If you have questions about this medicine, talk to your doctor, pharmacist, or health care provider.  2021 Elsevier/Gold Standard (2020-09-02 13:22:53) Cyclophosphamide Injection What is this medicine? CYCLOPHOSPHAMIDE (sye kloe FOSS fa mide) is a chemotherapy drug. It slows the growth of cancer cells. This medicine is used to treat many types of cancer like lymphoma, myeloma, leukemia, breast cancer, and ovarian cancer, to name a few. This medicine may be used for other purposes; ask your health care provider or pharmacist if you have questions. COMMON BRAND NAME(S): Cytoxan, Neosar What should I tell my  health care provider before I take this medicine? They need to know if you have any of these conditions:  heart disease  history of irregular heartbeat  infection  kidney disease  liver disease  low blood counts, like white cells, platelets, or red blood cells  on hemodialysis  recent or ongoing radiation therapy  scarring or thickening of the lungs  trouble passing urine  an unusual or allergic reaction to cyclophosphamide, other medicines, foods, dyes, or preservatives  pregnant or trying to get pregnant  breast-feeding How should I use this medicine? This drug is usually given as an injection into a vein or muscle or by infusion into a vein. It is administered in a hospital or clinic by a specially trained health care professional. Talk to your pediatrician regarding the use of this medicine in children. Special care may be needed. Overdosage: If you think you have taken too much of this medicine contact a poison control center or emergency room at once. NOTE: This medicine is only for you. Do not share this medicine with others. What if I miss a dose? It is important not to miss your dose. Call your doctor or health care professional if you are unable to keep an appointment. What may  interact with this medicine?  amphotericin B  azathioprine  certain antivirals for HIV or hepatitis  certain medicines for blood pressure, heart disease, irregular heart beat  certain medicines that treat or prevent blood clots like warfarin  certain other medicines for cancer  cyclosporine  etanercept  indomethacin  medicines that relax muscles for surgery  medicines to increase blood counts  metronidazole This list may not describe all possible interactions. Give your health care provider a list of all the medicines, herbs, non-prescription drugs, or dietary supplements you use. Also tell them if you smoke, drink alcohol, or use illegal drugs. Some items may interact with  your medicine. What should I watch for while using this medicine? Your condition will be monitored carefully while you are receiving this medicine. You may need blood work done while you are taking this medicine. Drink water or other fluids as directed. Urinate often, even at night. Some products may contain alcohol. Ask your health care professional if this medicine contains alcohol. Be sure to tell all health care professionals you are taking this medicine. Certain medicines, like metronidazole and disulfiram, can cause an unpleasant reaction when taken with alcohol. The reaction includes flushing, headache, nausea, vomiting, sweating, and increased thirst. The reaction can last from 30 minutes to several hours. Do not become pregnant while taking this medicine or for 1 year after stopping it. Women should inform their health care professional if they wish to become pregnant or think they might be pregnant. Men should not father a child while taking this medicine and for 4 months after stopping it. There is potential for serious side effects to an unborn child. Talk to your health care professional for more information. Do not breast-feed an infant while taking this medicine or for 1 week after stopping it. This medicine has caused ovarian failure in some women. This medicine may make it more difficult to get pregnant. Talk to your health care professional if you are concerned about your fertility. This medicine has caused decreased sperm counts in some men. This may make it more difficult to father a child. Talk to your health care professional if you are concerned about your fertility. Call your health care professional for advice if you get a fever, chills, or sore throat, or other symptoms of a cold or flu. Do not treat yourself. This medicine decreases your body's ability to fight infections. Try to avoid being around people who are sick. Avoid taking medicines that contain aspirin, acetaminophen,  ibuprofen, naproxen, or ketoprofen unless instructed by your health care professional. These medicines may hide a fever. Talk to your health care professional about your risk of cancer. You may be more at risk for certain types of cancer if you take this medicine. If you are going to need surgery or other procedure, tell your health care professional that you are using this medicine. Be careful brushing or flossing your teeth or using a toothpick because you may get an infection or bleed more easily. If you have any dental work done, tell your dentist you are receiving this medicine. What side effects may I notice from receiving this medicine? Side effects that you should report to your doctor or health care professional as soon as possible:  allergic reactions like skin rash, itching or hives, swelling of the face, lips, or tongue  breathing problems  nausea, vomiting  signs and symptoms of bleeding such as bloody or black, tarry stools; red or dark brown urine; spitting up blood or brown  material that looks like coffee grounds; red spots on the skin; unusual bruising or bleeding from the eyes, gums, or nose  signs and symptoms of heart failure like fast, irregular heartbeat, sudden weight gain; swelling of the ankles, feet, hands  signs and symptoms of infection like fever; chills; cough; sore throat; pain or trouble passing urine  signs and symptoms of kidney injury like trouble passing urine or change in the amount of urine  signs and symptoms of liver injury like dark yellow or brown urine; general ill feeling or flu-like symptoms; light-colored stools; loss of appetite; nausea; right upper belly pain; unusually weak or tired; yellowing of the eyes or skin Side effects that usually do not require medical attention (report to your doctor or health care professional if they continue or are bothersome):  confusion  decreased hearing  diarrhea  facial flushing  hair  loss  headache  loss of appetite  missed menstrual periods  signs and symptoms of low red blood cells or anemia such as unusually weak or tired; feeling faint or lightheaded; falls  skin discoloration This list may not describe all possible side effects. Call your doctor for medical advice about side effects. You may report side effects to FDA at 1-800-FDA-1088. Where should I keep my medicine? This drug is given in a hospital or clinic and will not be stored at home. NOTE: This sheet is a summary. It may not cover all possible information. If you have questions about this medicine, talk to your doctor, pharmacist, or health care provider.  2021 Elsevier/Gold Standard (2019-06-16 09:53:29)

## 2020-10-15 NOTE — Telephone Encounter (Signed)
Returned call to [name].  Per pt he was calling to request a refill of his amiodarone.  This was filled by refill pool.    Pt states per his prescription he needs a f/u appt with Dr. Lovena Le.  Appt made for first available (April 2022) with Dr. Lovena Le.  Advised Pt if he needed any more medication prior to this appt.  Pt recently saw Dr. Tamala Julian and has a current EKG on file.

## 2020-10-15 NOTE — Progress Notes (Signed)
Ok to treat with creatinine of 1.96 per Dr Marin Olp. dph

## 2020-10-18 ENCOUNTER — Telehealth: Payer: Self-pay | Admitting: *Deleted

## 2020-10-18 NOTE — Telephone Encounter (Signed)
Message received from patient requesting to cancel scheduled appts on 10/21/20. Dr. Marin Olp notified.  Call placed back to patient and patient states that he would like to cancel his appts on 10/21/20 d/t he usually has diarrhea the day after his treatment and is scheduled for a bone marrow biopsy on 10/22/20.  Pt informed per order of Dr. Marin Olp to return as scheduled on 11/04/20 per order of Dr. Marin Olp.  Pt appreciative of call back and has no questions at this time.

## 2020-10-21 ENCOUNTER — Other Ambulatory Visit: Payer: Self-pay | Admitting: Radiology

## 2020-10-21 ENCOUNTER — Ambulatory Visit: Payer: Medicare Other

## 2020-10-21 ENCOUNTER — Other Ambulatory Visit: Payer: Medicare Other

## 2020-10-22 ENCOUNTER — Ambulatory Visit (HOSPITAL_COMMUNITY)
Admission: RE | Admit: 2020-10-22 | Discharge: 2020-10-22 | Disposition: A | Discharge status: Home / Self Care | Payer: Medicare Other | Source: Ambulatory Visit | Attending: Hematology & Oncology | Admitting: Hematology & Oncology

## 2020-10-22 ENCOUNTER — Encounter (HOSPITAL_COMMUNITY): Payer: Self-pay

## 2020-10-22 ENCOUNTER — Other Ambulatory Visit: Payer: Self-pay

## 2020-10-22 DIAGNOSIS — Z79899 Other long term (current) drug therapy: Secondary | ICD-10-CM | POA: Diagnosis not present

## 2020-10-22 DIAGNOSIS — I719 Aortic aneurysm of unspecified site, without rupture: Secondary | ICD-10-CM | POA: Diagnosis not present

## 2020-10-22 DIAGNOSIS — D7589 Other specified diseases of blood and blood-forming organs: Secondary | ICD-10-CM | POA: Insufficient documentation

## 2020-10-22 DIAGNOSIS — D696 Thrombocytopenia, unspecified: Secondary | ICD-10-CM | POA: Insufficient documentation

## 2020-10-22 DIAGNOSIS — Z79890 Hormone replacement therapy (postmenopausal): Secondary | ICD-10-CM | POA: Insufficient documentation

## 2020-10-22 DIAGNOSIS — Z8249 Family history of ischemic heart disease and other diseases of the circulatory system: Secondary | ICD-10-CM | POA: Diagnosis not present

## 2020-10-22 DIAGNOSIS — Z952 Presence of prosthetic heart valve: Secondary | ICD-10-CM | POA: Insufficient documentation

## 2020-10-22 DIAGNOSIS — C9000 Multiple myeloma not having achieved remission: Secondary | ICD-10-CM | POA: Diagnosis not present

## 2020-10-22 DIAGNOSIS — Z8674 Personal history of sudden cardiac arrest: Secondary | ICD-10-CM | POA: Insufficient documentation

## 2020-10-22 DIAGNOSIS — N184 Chronic kidney disease, stage 4 (severe): Secondary | ICD-10-CM | POA: Insufficient documentation

## 2020-10-22 DIAGNOSIS — I351 Nonrheumatic aortic (valve) insufficiency: Secondary | ICD-10-CM | POA: Insufficient documentation

## 2020-10-22 DIAGNOSIS — I129 Hypertensive chronic kidney disease with stage 1 through stage 4 chronic kidney disease, or unspecified chronic kidney disease: Secondary | ICD-10-CM | POA: Diagnosis not present

## 2020-10-22 DIAGNOSIS — I4891 Unspecified atrial fibrillation: Secondary | ICD-10-CM | POA: Insufficient documentation

## 2020-10-22 DIAGNOSIS — E1122 Type 2 diabetes mellitus with diabetic chronic kidney disease: Secondary | ICD-10-CM | POA: Diagnosis not present

## 2020-10-22 DIAGNOSIS — D649 Anemia, unspecified: Secondary | ICD-10-CM | POA: Insufficient documentation

## 2020-10-22 DIAGNOSIS — Z7901 Long term (current) use of anticoagulants: Secondary | ICD-10-CM | POA: Diagnosis not present

## 2020-10-22 DIAGNOSIS — C9001 Multiple myeloma in remission: Secondary | ICD-10-CM | POA: Diagnosis not present

## 2020-10-22 DIAGNOSIS — D6489 Other specified anemias: Secondary | ICD-10-CM | POA: Diagnosis not present

## 2020-10-22 LAB — CBC WITH DIFFERENTIAL/PLATELET
Abs Immature Granulocytes: 0.01 10*3/uL (ref 0.00–0.07)
Basophils Absolute: 0 10*3/uL (ref 0.0–0.1)
Basophils Relative: 0 %
Eosinophils Absolute: 0.1 10*3/uL (ref 0.0–0.5)
Eosinophils Relative: 1 %
HCT: 33.8 % — ABNORMAL LOW (ref 39.0–52.0)
Hemoglobin: 10.9 g/dL — ABNORMAL LOW (ref 13.0–17.0)
Immature Granulocytes: 0 %
Lymphocytes Relative: 6 %
Lymphs Abs: 0.3 10*3/uL — ABNORMAL LOW (ref 0.7–4.0)
MCH: 29.6 pg (ref 26.0–34.0)
MCHC: 32.2 g/dL (ref 30.0–36.0)
MCV: 91.8 fL (ref 80.0–100.0)
Monocytes Absolute: 0.8 10*3/uL (ref 0.1–1.0)
Monocytes Relative: 14 %
Neutro Abs: 4.8 10*3/uL (ref 1.7–7.7)
Neutrophils Relative %: 79 %
Platelets: 107 10*3/uL — ABNORMAL LOW (ref 150–400)
RBC: 3.68 MIL/uL — ABNORMAL LOW (ref 4.22–5.81)
RDW: 20 % — ABNORMAL HIGH (ref 11.5–15.5)
WBC: 6.1 10*3/uL (ref 4.0–10.5)
nRBC: 0.3 % — ABNORMAL HIGH (ref 0.0–0.2)

## 2020-10-22 LAB — GLUCOSE, CAPILLARY: Glucose-Capillary: 115 mg/dL — ABNORMAL HIGH (ref 70–99)

## 2020-10-22 MED ORDER — MIDAZOLAM HCL 2 MG/2ML IJ SOLN
INTRAMUSCULAR | Status: AC | PRN
  Administered 2020-10-22 (×2): 1 mg via INTRAVENOUS

## 2020-10-22 MED ORDER — SODIUM CHLORIDE 0.9 % IV SOLN: INTRAVENOUS | Status: DC

## 2020-10-22 MED ORDER — FENTANYL CITRATE (PF) 100 MCG/2ML IJ SOLN
INTRAMUSCULAR | Status: AC | PRN
  Administered 2020-10-22 (×2): 50 ug via INTRAVENOUS

## 2020-10-22 MED ORDER — MIDAZOLAM HCL 2 MG/2ML IJ SOLN
INTRAMUSCULAR | Status: AC
  Filled 2020-10-22: qty 4

## 2020-10-22 MED ORDER — LIDOCAINE HCL (PF) 1 % IJ SOLN
INTRAMUSCULAR | Status: AC | PRN
  Administered 2020-10-22: 10 mL

## 2020-10-22 MED ORDER — FENTANYL CITRATE (PF) 100 MCG/2ML IJ SOLN
INTRAMUSCULAR | Status: AC
  Filled 2020-10-22: qty 2

## 2020-10-22 NOTE — H&P (Signed)
Chief Complaint: Patient was seen in consultation today for bone marrow biopsy with aspiration   Referring Physician(s): Volanda Napoleon  Supervising Physician: Daryll Brod  Patient Status: [facility] - Out-pt  History of Present Illness: Eric Corning., Eric [name] is a 70 y.o. male with a medical history significant for aortic aneurysm, aortic valve regurgitation (s/p valve and aortic root replacement), atrial flutter/fibrillation, heart failure, cardiac arrest (due to v-fib), chronic kidney disease Stage IV, HTN, DM2 and multiple myeloma. He is currently being treated with steroids and chemotherapy and is currently being considered for a stem cell transplant.   Interventional Radiology has been asked to evaluate this patient for an image-guided bone marrow biopsy with aspiration as part of his work up for a possible stem cell transplant. He had a prior bone marrow biopsy with IR May 21, 2020.     Past Medical History:  Diagnosis Date  . Acute blood loss anemia   . Acute encephalopathy   . Acute hypoxemic respiratory failure ([place])   . Acute idiopathic gout of right ankle   . Acute lumbar back pain   . Acute on chronic systolic heart failure ([place])   . Acute respiratory failure ([place])   . AKI (acute kidney injury) ([place])   . Altered mental status   . Aortic aneurysm without rupture ([place]) 02/09/2016  . Aortic root enlargement ([place]) 02/13/2012  . Aortic valve regurgitation, acquired 02/13/2012  . Arrhythmia   . Atrial flutter ([place]) 03/18/2012  . Back pain   . Cardiac arrest ([place]) 02/01/2016  . CHF (congestive heart failure) ([place])   . Chronic anticoagulation 03/04/2013  . Chronic kidney disease    kidney fx studies increased   . Chronic lower back pain   . Chronic renal insufficiency   . Chronic systolic heart failure ([place]) 02/13/2012   Recent diagnosis 4 / 2013, LVEF 25% by Echo 12/2011  03/2013: Echo at [facility] Conclusions: 1. Left ventricular ejection fraction estimated by 2D at  40-45 percent. 2. Mild concentric left ventricular hypertrophy. 3. Mild left atrial enlargement. 4. Moderate aortic valve regurgitation. 5. The aortic root at the sinus(es) of valsalva is moderately dilated 6. Mild mitral valve regurgitation. 7.  . CKD (chronic kidney disease)   . CKD (chronic kidney disease), stage IV ([place]) 08/06/2013   Creatinine 2.4 on 07/04/13   . Claustrophobia   . Colon cancer screening 03/04/2013  . Debility 02/22/2016  . Diabetes mellitus type 2 in nonobese (HCC)   . Diabetes mellitus type 2 in obese ([place])   . Dysrhythmia    "palpitations"  . Encounter for central line placement   . Exertional dyspnea 01/2012  . 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 Goals of care, counseling/discussion 05/05/2020  . HCAP (healthcare-associated pneumonia)   . Heart murmur   . Hyperlipidemia 02/13/2012  . Hypertension   . Hypothyroidism   . Internal hemorrhoids without mention of complication 11/30/6576  . Labile blood pressure   . Left bundle branch block 02/13/2012  . Leg weakness, bilateral   . Long term (current) use of anticoagulants 08/06/2013   Eliquis therapy   . Long term current use of amiodarone 08/07/2016  . Lower extremity weakness   . Migraine 02/13/12   "opthalmic"  . Multiple myeloma ([place]) 05/05/2020  . Non-traumatic rhabdomyolysis   . Obesity (BMI 30-39.9) 02/13/2012  . Pain   . Paroxysmal atrial fibrillation (HCC)   . Pneumonia   . Retroperitoneal bleed   . Right  ankle pain   . Right knee pain   . Severe aortic regurgitation 02/09/2016  . Special screening for malignant neoplasms, colon 04/11/2013  . Thyroid activity decreased   . Tibial pain   . Varicose vein of leg    right  . Ventricular fibrillation ([place]) 02/09/2016  . Weakness of both lower extremities     Past Surgical History:  Procedure Laterality Date  . AORTIC VALVE REPLACEMENT  11/20/2018  . CARDIAC CATHETERIZATION N/A 02/17/2016   Procedure: Left Heart Cath and Coronary  Angiography;  Surgeon: Jettie Booze, Eric [name];  Location: [place] CV LAB;  Service: Cardiovascular;  Laterality: N/A;  . CARDIOVERSION  03/22/2012   Procedure: CARDIOVERSION;  Surgeon: Candee Furbish, Eric [name];  Location: [facility] ENDOSCOPY;  Service: Cardiovascular;  Laterality: N/A;  . CARDIOVERSION  04/19/2012   Procedure: CARDIOVERSION;  Surgeon: Sinclair Grooms, Eric [name];  Location: [place];  Service: Cardiovascular;  Laterality: N/A;  . COLONOSCOPY N/A 04/11/2013   Procedure: COLONOSCOPY;  Surgeon: Inda Castle, Eric [name];  Location: [facility] ENDOSCOPY;  Service: Endoscopy;  Laterality: N/A;  . COLONOSCOPY N/A 04/11/2013   Procedure: COLONOSCOPY;  Surgeon: Inda Castle, Eric [name];  Location: [facility] ENDOSCOPY;  Service: Endoscopy;  Laterality: N/A;  . EP IMPLANTABLE DEVICE N/A 02/17/2016   Procedure: BiV ICD Insertion CRT-D;  Surgeon: Evans Lance, Eric [name];  Location: [place] CV LAB;  Service: Cardiovascular;  Laterality: N/A;  . FINGER SURGERY  2012   "4th digit right hand; thumb on left hand"  . RADIOLOGY WITH ANESTHESIA N/A 02/11/2016   Procedure: MRI OF THE BRAIN WITHOUT CONTRAST, LUMBAR WITHOUT CONTRAST;  Surgeon: Medication Radiologist, Eric [name];  Location: [place];  Service: Radiology;  Laterality: N/A;  DR. WOOD/MRI  . RIGHT/LEFT HEART CATH AND CORONARY ANGIOGRAPHY N/A 07/26/2018   Procedure: RIGHT/LEFT HEART CATH AND CORONARY ANGIOGRAPHY;  Surgeon: Belva Crome, Eric [name];  Location: [place] CV LAB;  Service: Cardiovascular;  Laterality: N/A;  . Skin melanocytoma excision  2012   "above left clavicle"  . STERNAL INCISION RECLOSURE  11/2018  . STERNAL WIRE REMOVAL  11/2018  . STERNAL WOUND DEBRIDEMENT  11/2018  . TEE WITHOUT CARDIOVERSION  03/22/2012   Procedure: TRANSESOPHAGEAL ECHOCARDIOGRAM (TEE);  Surgeon: Candee Furbish, Eric [name];  [facility]COPY;  Service: Cardiovascular;  Laterality: N/A;  . TEE WITHOUT CARDIOVERSION N/A 02/08/2016   Procedure: TRANSESOPHAGEAL ECHOCARDIOGRAM (TEE);  Surgeon: Lelon Perla, Eric [name];   Location: [facility] ENDOSCOPY;  Service: Cardiovascular;  Laterality: N/A;    Allergies: Lactose intolerance (gi)  Medications: Prior to Admission medications   Medication Sig Start Date End Date Taking? Authorizing Provider  acetaminophen (TYLENOL) 500 MG tablet Take 1,000 mg by mouth every 4 (four) hours as needed for moderate pain or headache.    Provider, Historical, Eric [name]  amiodarone (PACERONE) 100 MG tablet TAKE 1 TABLET(100 MG) BY MOUTH DAILY. PLEASE MAKE OVERDUE APPOINTMENT WITH DOCTOR TAYLOR BEFORE ANYMORE REFILLS. Early YOU FIRST ATTEMPT 10/12/20   Evans Lance, Eric [name]  apixaban (ELIQUIS) 5 MG TABS tablet Take 1 tablet (5 mg total) by mouth 2 (two) times daily. 08/26/20   Glendale Chard, Eric [name]  carvedilol (COREG) 6.25 MG tablet TAKE 1 TABLET(6.25 MG) BY MOUTH TWICE DAILY 09/04/20   [place] Chard, Eric [name]  docusate sodium (COLACE) 100 MG capsule Take 100 mg by mouth daily as needed for mild constipation.    Provider, Historical, Eric [name]  famciclovir (FAMVIR) 500 MG tablet Take 1 tablet (500 mg total) by mouth daily. 06/11/20   Volanda Napoleon, Eric [name]  fluticasone (FLONASE) 50 MCG/ACT nasal spray Place 2 sprays into the nose as needed for allergies.     Provider, Historical, Eric [name]  furosemide (LASIX) 80 MG tablet Take 1 tablet (80 mg total) by mouth daily. 07/23/20   Belva Crome, Eric [name]  Hypromellose (ARTIFICIAL TEARS OP) Place 1 drop into both eyes daily as needed (dry eyes).    Provider, Historical, Eric [name]  ipratropium (ATROVENT) 0.06 % nasal spray Place 2 sprays into both nostrils 2 (two) times daily. 05/13/19   Glendale Chard, Eric [name]  isosorbide-hydrALAZINE (BIDIL) 20-37.5 MG tablet Take 1 tablet by mouth 3 (three) times daily. 04/22/20   [place]endale Chard, Eric [name]  levothyroxine (SYNTHROID) 75 MCG tablet TAKE 1 TABLET BY MOUTH DAILY IN THE MORNING EXCEPT TAKE 1/2 TABLET ON SUNDAYS IN THE MORNING 08/13/20   Glendale Chard, Eric [name]  loperamide (IMODIUM) 2 MG capsule Take 2 mg by mouth as needed for diarrhea or loose stools.    Provider,  Historical, Eric [name]  ondansetron (ZOFRAN) 8 MG tablet Take 1 tablet (8 mg total) by mouth 2 (two) times daily as needed for refractory nausea / vomiting. Start on day 3 after Cytoxan. 06/01/20   Volanda Napoleon, Eric [name]  potassium chloride 20 MEQ/15ML (10%) SOLN Take 15 mLs (20 mEq total) by mouth 2 (two) times daily. Patient taking differently: Take 20 mEq by mouth daily. 07/23/20   Belva Crome, Eric [name]  prochlorperazine (COMPAZINE) 10 MG tablet Take 1 tablet (10 mg total) by mouth every 6 (six) hours as needed (Nausea or vomiting). 06/01/20   Volanda Napoleon, Eric [name]  rosuvastatin (CRESTOR) 20 MG tablet Take 1 tablet (20 mg total) by mouth daily. 08/26/20   Glendale Chard, Eric [name]  vitamin B-12 (CYANOCOBALAMIN) 100 MCG tablet Take 100 mcg by mouth daily.    Provider, Historical, Eric [name]  vitamin C (ASCORBIC ACID) 500 MG tablet Take 500 mg by mouth as needed.     Provider, Historical, Eric [name]  VITAMIN D PO Take 1 capsule by mouth daily.    Provider, Historical, Eric [name]     Family History  Problem Relation Age of Onset  . Hypertension Mother   . Heart disease Mother   . Heart failure Mother   . Diabetes Mother   . Hypertension Father   . Heart disease Father   . Heart failure Father     Social History   Socioeconomic History  . Marital status: Married    Spouse name: [name]  . Number of children: 1  . Years of education: Not on file  . Highest education level: Not on file  Occupational History    Employer: [employer]  Tobacco Use  . Smoking status: Never Smoker  . Smokeless tobacco: Never Used  Vaping Use  . Vaping Use: Never used  Substance and Sexual Activity  . Alcohol use: Yes    Comment: rarely  . Drug use: No  . Sexual activity: Not Currently  Other Topics Concern  . 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   Social Determinants of Health   Financial Resource Strain: Low Risk   . Difficulty of Paying Living  Expenses: Not hard at all  Food Insecurity: No Food Insecurity  . Worried About Charity fundraiser in the Last Year: Never true  . Ran Out of Food in the Last Year: Never true  Transportation Needs: No Transportation Needs  . Lack of Transportation (Medical):  No  . Lack of Transportation (Non-Medical): No  Physical Activity: Inactive  . Days of Exercise per Week: 0 days  . Minutes of Exercise per Session: 0 min  Stress: No Stress Concern Present  . Feeling of Stress : Not at all  Social Connections: Not on file    Review of Systems: A 12 point ROS discussed and pertinent positives are indicated in the HPI above.  All other systems are negative.  Review of Systems  Constitutional: Negative for appetite change and fatigue.  Respiratory: Negative for cough and shortness of breath.   Cardiovascular: Positive for leg swelling. Negative for chest pain.  Gastrointestinal: Negative for abdominal pain, nausea and vomiting.  Neurological: Negative for dizziness and headaches.  Hematological: Negative for adenopathy. Does not bruise/bleed easily.    Vital Signs: BP (!) 140/99   Pulse 68   Temp 98.3 F (36.8 C) (Oral)   Resp 18   SpO2 100%   Physical Exam Constitutional:      General: He is not in acute distress. HENT:     Mouth/Throat:     Mouth: Mucous membranes are moist.     Pharynx: Oropharynx is clear.  Cardiovascular:     Rate and Rhythm: Normal rate. Rhythm irregular.     Pulses: Normal pulses.     Comments: Visible heave/lift Abdominal:     General: Bowel sounds are normal.     Palpations: Abdomen is soft.     Tenderness: There is no abdominal tenderness.  Musculoskeletal:        General: Normal range of motion.     Cervical back: Normal range of motion.     Right lower leg: Edema present.     Left lower leg: Edema present.  Skin:    General: Skin is warm and dry.  Neurological:     Mental Status: He is alert and oriented to person, place, and time.   Psychiatric:        Mood and Affect: Mood normal.        Behavior: Behavior normal.        Thought Content: Thought content normal.        Judgment: Judgment normal.     Imaging: No results found.  Labs:  CBC: Recent Labs    09/16/20 1212 10/08/20 0815 10/15/20 0919 10/22/20 0930  WBC 6.2 4.0 6.2 6.1  HGB 10.5* 10.8* 10.9* 10.9*  HCT 33.7* 32.8* 33.4* 33.8*  PLT 117* 150 110* 107*    COAGS: Recent Labs    05/21/20 0807  INR 1.4*    BMP: Recent Labs    06/11/20 1016 06/18/20 1216 06/25/20 1054 07/09/20 1137 07/30/20 1555 08/06/20 1214 09/10/20 1211 09/16/20 1212 10/08/20 0815 10/15/20 0919  NA 135 135 138   < > 146*   < > 138 144 140 142  K 3.5 3.8 3.9   < > 3.9   < > 3.9 4.0 3.7 3.8  CL 105 104 107   < > 109*   < > 105 109 106 108  CO2 '24 26 26   ' < > 24   < > '25 27 26 27  ' GLUCOSE 143* 117* 120*   < > 105*   < > 153* 176* 151* 158*  BUN 29* 32* 33*   < > 28*   < > 28* 34* 23 29*  CALCIUM 9.1 9.4 8.9   < > 8.7   < > 9.1 9.1 9.3 9.3  CREATININE 2.21* 2.00* 2.01*   < >  1.84*   < > 1.80* 2.39* 1.80* 1.96*  GFRNONAA 30* 33* 33*   < > 37*   < > 40* 29* 40* 37*  GFRAA 34* 39* 38*  --  43*  --   --   --   --   --    < > = values in this interval not displayed.    LIVER FUNCTION TESTS: Recent Labs    09/10/20 1211 09/16/20 1212 10/08/20 0815 10/15/20 0919  BILITOT 1.0 0.9 0.9 0.8  AST '26 27 23 23  ' ALT '27 26 19 22  ' ALKPHOS 62 66 63 67  PROT 5.8* 5.8* 5.6* 5.7*  ALBUMIN 3.9 4.0 3.9 3.9    TUMOR MARKERS: No results for input(s): AFPTM, CEA, CA199, CHROMGRNA in the last 8760 hours.  Assessment and Plan:  Multiple myeloma; possible stem cell transplant candidate: Eric Corning., 70 year old male, presents today to the [facility] department for an image-guided bone marrow biopsy with aspiration.  Risks and benefits of this procedure were discussed with the patient including, but not limited to bleeding, infection, damage  to adjacent structures or low yield requiring additional tests.  All of the questions were answered and there is agreement to proceed.  The patient has been NPO. Labs and vitals have been reviewed. He does take Eliquis 5 mg daily. Consent signed and in chart.  Thank you for this interesting consult.  I greatly enjoyed meeting Eric Corning., Eric [name] and look forward to participating in their care.  A copy of this report was sent to the requesting provider on this date.  Electronically Signed: Soyla Dryer, AGACNP-BC [phone] 10/22/2020, 10:13 AM   I spent a total of  30 Minutes   in face to face in clinical consultation, greater than 50% of which was counseling/coordinating care for bone marrow biopsy with aspiration.

## 2020-10-22 NOTE — Discharge Instructions (Signed)
Please call Interventional Radiology clinic [phone] with any questions or concerns.  You may remove your dressing and shower tomorrow.   Bone Marrow Aspiration and Bone Marrow Biopsy, Adult, Care After This sheet gives you information about how to care for yourself after your procedure. Your health care provider may also give you more specific instructions. If you have problems or questions, contact your health care provider. What can I expect after the procedure? After the procedure, it is common to have:  Mild pain and tenderness.  Swelling.  Bruising. Follow these instructions at home: Puncture site care  Follow instructions from your health care provider about how to take care of the puncture site. Make sure you: ? Wash your hands with soap and water before and after you change your bandage (dressing). If soap and water are not available, use hand sanitizer. ? Change your dressing as told by your health care provider.  Check your puncture site every day for signs of infection. Check for: ? More redness, swelling, or pain. ? Fluid or blood. ? Warmth. ? Pus or a bad smell.   Activity  Return to your normal activities as told by your health care provider. Ask your health care provider what activities are safe for you.  Do not lift anything that is heavier than 10 lb (4.5 kg), or the limit that you are told, until your health care provider says that it is safe.  Do not drive for 24 hours if you were given a sedative during your procedure. General instructions  Take over-the-counter and prescription medicines only as told by your health care provider.  Do not take baths, swim, or use a hot tub until your health care provider approves. Ask your health care provider if you may take showers. You may only be allowed to take sponge baths.  If directed, put ice on the affected area. To do this: ? Put ice in a plastic bag. ? Place a towel between your skin and the bag. ? Leave  the ice on for 20 minutes, 2-3 times a day.  Keep all follow-up visits as told by your health care provider. This is important.   Contact a health care provider if:  Your pain is not controlled with medicine.  You have a fever.  You have more redness, swelling, or pain around the puncture site.  You have fluid or blood coming from the puncture site.  Your puncture site feels warm to the touch.  You have pus or a bad smell coming from the puncture site. Summary  After the procedure, it is common to have mild pain, tenderness, swelling, and bruising.  Follow instructions from your health care provider about how to take care of the puncture site and what activities are safe for you.  Take over-the-counter and prescription medicines only as told by your health care provider.  Contact a health care provider if you have any signs of infection, such as fluid or blood coming from the puncture site. This information is not intended to replace advice given to you by your health care provider. Make sure you discuss any questions you have with your health care provider. Document Revised: 01/28/2019 Document Reviewed: 01/28/2019 Elsevier Patient Education  2021 Elsevier Inc.   Moderate Conscious Sedation, Adult, Care After This sheet gives you information about how to care for yourself after your procedure. Your health care provider may also give you more specific instructions. If you have problems or questions, contact your health care provider. What   can I expect after the procedure? After the procedure, it is common to have:  Sleepiness for several hours.  Impaired judgment for several hours.  Difficulty with balance.  Vomiting if you eat too soon. Follow these instructions at home: For the time period you were told by your health care provider:  Rest.  Do not participate in activities where you could fall or become injured.  Do not drive or use machinery.  Do not drink  alcohol.  Do not take sleeping pills or medicines that cause drowsiness.  Do not make important decisions or sign legal documents.  Do not take care of children on your own.      Eating and drinking  Follow the diet recommended by your health care provider.  Drink enough fluid to keep your urine pale yellow.  If you vomit: ? Drink water, juice, or soup when you can drink without vomiting. ? Make sure you have little or no nausea before eating solid foods.   General instructions  Take over-the-counter and prescription medicines only as told by your health care provider.  Have a responsible adult stay with you for the time you are told. It is important to have someone help care for you until you are awake and alert.  Do not smoke.  Keep all follow-up visits as told by your health care provider. This is important. Contact a health care provider if:  You are still sleepy or having trouble with balance after 24 hours.  You feel light-headed.  You keep feeling nauseous or you keep vomiting.  You develop a rash.  You have a fever.  You have redness or swelling around the IV site. Get help right away if:  You have trouble breathing.  You have new-onset confusion at home. Summary  After the procedure, it is common to feel sleepy, have impaired judgment, or feel nauseous if you eat too soon.  Rest after you get home. Know the things you should not do after the procedure.  Follow the diet recommended by your health care provider and drink enough fluid to keep your urine pale yellow.  Get help right away if you have trouble breathing or new-onset confusion at home. This information is not intended to replace advice given to you by your health care provider. Make sure you discuss any questions you have with your health care provider. Document Revised: 01/09/2020 Document Reviewed: 08/07/2019 Elsevier Patient Education  2021 Elsevier Inc.  

## 2020-10-22 NOTE — Procedures (Signed)
Interventional Radiology Procedure Note  Procedure: CT BM ASP AND CORE BX    Complications: None  Estimated Blood Loss:  MIN  Findings: 11 G CORE AND ASP    M. TREVOR Skylor Hughson, MD    

## 2020-10-29 ENCOUNTER — Encounter (HOSPITAL_COMMUNITY): Payer: Self-pay | Admitting: Hematology & Oncology

## 2020-11-01 ENCOUNTER — Encounter (HOSPITAL_COMMUNITY): Payer: Self-pay | Admitting: Hematology & Oncology

## 2020-11-01 ENCOUNTER — Encounter: Payer: Self-pay | Admitting: *Deleted

## 2020-11-02 ENCOUNTER — Encounter: Payer: Self-pay | Admitting: Hematology & Oncology

## 2020-11-02 ENCOUNTER — Encounter: Payer: Self-pay | Admitting: *Deleted

## 2020-11-02 ENCOUNTER — Other Ambulatory Visit: Payer: Self-pay

## 2020-11-02 ENCOUNTER — Inpatient Hospital Stay: Payer: Medicare Other | Attending: Hematology & Oncology

## 2020-11-02 ENCOUNTER — Telehealth: Payer: Self-pay

## 2020-11-02 ENCOUNTER — Inpatient Hospital Stay (HOSPITAL_BASED_OUTPATIENT_CLINIC_OR_DEPARTMENT_OTHER): Payer: Medicare Other | Admitting: Hematology & Oncology

## 2020-11-02 VITALS — BP 108/66 | HR 97 | Temp 97.9°F | Resp 18 | Wt 224.5 lb

## 2020-11-02 DIAGNOSIS — C9001 Multiple myeloma in remission: Secondary | ICD-10-CM

## 2020-11-02 DIAGNOSIS — Z5111 Encounter for antineoplastic chemotherapy: Secondary | ICD-10-CM | POA: Insufficient documentation

## 2020-11-02 DIAGNOSIS — Z79899 Other long term (current) drug therapy: Secondary | ICD-10-CM | POA: Diagnosis not present

## 2020-11-02 DIAGNOSIS — C9000 Multiple myeloma not having achieved remission: Secondary | ICD-10-CM | POA: Diagnosis not present

## 2020-11-02 DIAGNOSIS — Z5112 Encounter for antineoplastic immunotherapy: Secondary | ICD-10-CM | POA: Diagnosis not present

## 2020-11-02 LAB — CBC WITH DIFFERENTIAL (CANCER CENTER ONLY)
Abs Immature Granulocytes: 0.01 10*3/uL (ref 0.00–0.07)
Basophils Absolute: 0 10*3/uL (ref 0.0–0.1)
Basophils Relative: 1 %
Eosinophils Absolute: 0 10*3/uL (ref 0.0–0.5)
Eosinophils Relative: 1 %
HCT: 32.5 % — ABNORMAL LOW (ref 39.0–52.0)
Hemoglobin: 10.8 g/dL — ABNORMAL LOW (ref 13.0–17.0)
Immature Granulocytes: 0 %
Lymphocytes Relative: 11 %
Lymphs Abs: 0.5 10*3/uL — ABNORMAL LOW (ref 0.7–4.0)
MCH: 29.6 pg (ref 26.0–34.0)
MCHC: 33.2 g/dL (ref 30.0–36.0)
MCV: 89 fL (ref 80.0–100.0)
Monocytes Absolute: 0.9 10*3/uL (ref 0.1–1.0)
Monocytes Relative: 21 %
Neutro Abs: 2.8 10*3/uL (ref 1.7–7.7)
Neutrophils Relative %: 66 %
Platelet Count: 196 10*3/uL (ref 150–400)
RBC: 3.65 MIL/uL — ABNORMAL LOW (ref 4.22–5.81)
RDW: 18.6 % — ABNORMAL HIGH (ref 11.5–15.5)
WBC Count: 4.2 10*3/uL (ref 4.0–10.5)
nRBC: 0 % (ref 0.0–0.2)

## 2020-11-02 LAB — CMP (CANCER CENTER ONLY)
ALT: 16 U/L (ref 0–44)
AST: 22 U/L (ref 15–41)
Albumin: 3.8 g/dL (ref 3.5–5.0)
Alkaline Phosphatase: 59 U/L (ref 38–126)
Anion gap: 10 (ref 5–15)
BUN: 39 mg/dL — ABNORMAL HIGH (ref 8–23)
CO2: 25 mmol/L (ref 22–32)
Calcium: 9.1 mg/dL (ref 8.9–10.3)
Chloride: 106 mmol/L (ref 98–111)
Creatinine: 2.4 mg/dL — ABNORMAL HIGH (ref 0.61–1.24)
GFR, Estimated: 29 mL/min — ABNORMAL LOW (ref >60)
Glucose, Bld: 141 mg/dL — ABNORMAL HIGH (ref 70–99)
Potassium: 3.5 mmol/L (ref 3.5–5.1)
Sodium: 141 mmol/L (ref 135–145)
Total Bilirubin: 0.7 mg/dL (ref 0.3–1.2)
Total Protein: 5.5 g/dL — ABNORMAL LOW (ref 6.5–8.1)

## 2020-11-02 LAB — SURGICAL PATHOLOGY

## 2020-11-02 LAB — LACTATE DEHYDROGENASE: LDH: 222 U/L — ABNORMAL HIGH (ref 98–192)

## 2020-11-02 NOTE — Telephone Encounter (Signed)
appts made per 11/02/20 los and pt will rec appts in tx/avs      Eric [name] 

## 2020-11-02 NOTE — Progress Notes (Signed)
Dr Marin Olp spoke to Dr Laverta Baltimore today regarding this patient. Will place order.  Oncology Nurse Navigator Documentation  Oncology Nurse Navigator Flowsheets 11/02/2020  Abnormal Finding Date -  Confirmed Diagnosis Date -  Diagnosis Status -  Planned Course of Treatment -  Phase of Treatment -  Chemotherapy Pending- Reason: -  Chemotherapy Actual Start Date: -  Navigator Follow Up Date: 12/03/2020  Navigator Follow Up Reason: Follow-up Appointment;Chemotherapy  Navigator Location [facility]  Referral Date to RadOnc/MedOnc -  Navigator Encounter Type Appt/Treatment Plan Review  Telephone -  Treatment Initiated Date -  Patient Visit Type MedOnc  Treatment Phase Active Tx  Barriers/Navigation Needs Coordination of Care;Education;Employed  Education -  Interventions Referrals  Acuity Level 2-Minimal Needs (1-2 Barriers Identified)  Referrals Other  Coordination of Care -  Education Method -  Support Groups/Services Friends and Family  Time Spent with Patient 30

## 2020-11-02 NOTE — Progress Notes (Signed)
Hematology and Oncology Follow Up Visit  Eric [name]., MD [id number] Sep 09, 1951 70 y.o. 11/02/2020   Principle Diagnosis:   IgG kappa myeloma  - 1q+  Current Therapy:    CyBorD -- s/p cycle #5  - start on 06/04/2020     Interim History:  [name] is back for follow-up.  We did finally have a bone marrow biopsy done on him.  This was done on 10/22/2020.  The pathology report ([id number]) showed a remission.  He had a full remission.  He will have 1% plasma cells.  There is no cytogenetic abnormalities at this point.  I know this is difficult option.  I think that it would be reasonable for him to be considered for stem cell transplant.  As such, we will see about for him to Dr. Laverta Baltimore at [facility].  He feels well.  He still working.  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  He has had no problems with nausea or vomiting.  He does have little bit of diarrhea after each treatment.  He has had no problems with fever.  He has had no rashes.  There is been no bleeding.  His last myeloma studies done back in December showed an M spike of 0.1 g/dL.  His IgG level was 460 mg/dL.  The Kappa light chain was 1.9 mg/dL.  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  Medications:  Current Outpatient Medications:  .  acetaminophen (TYLENOL) 500 MG tablet, Take 1,000 mg by mouth every 4 (four) hours as needed for moderate pain or headache., Disp: , Rfl:  .  amiodarone (PACERONE) 100 MG tablet, TAKE 1 TABLET(100 MG) BY MOUTH DAILY. PLEASE MAKE OVERDUE APPOINTMENT WITH DOCTOR TAYLOR BEFORE ANYMORE REFILLS. THANK YOU FIRST ATTEMPT, Disp: 90 tablet, Rfl: 3 .  apixaban (ELIQUIS) 5 MG TABS tablet, Take 1 tablet (5 mg total) by mouth 2 (two) times daily., Disp: 60 tablet, Rfl: 5 .  carvedilol (COREG) 6.25 MG tablet, TAKE 1 TABLET(6.25 MG) BY MOUTH TWICE DAILY, Disp: 180 tablet, Rfl: 1 .  docusate sodium (COLACE) 100 MG capsule, Take 100 mg by mouth daily as needed for mild constipation., Disp: ,  Rfl:  .  famciclovir (FAMVIR) 500 MG tablet, Take 1 tablet (500 mg total) by mouth daily., Disp: 30 tablet, Rfl: 3 .  fluticasone (FLONASE) 50 MCG/ACT nasal spray, Place 2 sprays into the nose as needed for allergies. , Disp: , Rfl:  .  furosemide (LASIX) 80 MG tablet, Take 1 tablet (80 mg total) by mouth daily., Disp: 90 tablet, Rfl: 3 .  Hypromellose (ARTIFICIAL TEARS OP), Place 1 drop into both eyes daily as needed (dry eyes)., Disp: , Rfl:  .  ipratropium (ATROVENT) 0.06 % nasal spray, Place 2 sprays into both nostrils 2 (two) times daily., Disp: 15 mL, Rfl: 3 .  isosorbide-hydrALAZINE (BIDIL) 20-37.5 MG tablet, Take 1 tablet by mouth 3 (three) times daily., Disp: 270 tablet, Rfl: 3 .  levothyroxine (SYNTHROID) 75 MCG tablet, TAKE 1 TABLET BY MOUTH DAILY IN THE MORNING EXCEPT TAKE 1/2 TABLET ON SUNDAYS IN THE MORNING, Disp: 90 tablet, Rfl: 0 .  loperamide (IMODIUM) 2 MG capsule, Take 2 mg by mouth as needed for diarrhea or loose stools., Disp: , Rfl:  .  ondansetron (ZOFRAN) 8 MG tablet, Take 1 tablet (8 mg total) by mouth 2 (two) times daily as needed for refractory nausea / vomiting. Start on day 3 after Cytoxan., Disp: 30 tablet, Rfl: 1 .  potassium  chloride 20 MEQ/15ML (10%) SOLN, Take 15 mLs (20 mEq total) by mouth 2 (two) times daily. (Patient taking differently: Take 20 mEq by mouth daily.), Disp: 946 mL, Rfl: 6 .  prochlorperazine (COMPAZINE) 10 MG tablet, Take 1 tablet (10 mg total) by mouth every 6 (six) hours as needed (Nausea or vomiting)., Disp: 30 tablet, Rfl: 1 .  rosuvastatin (CRESTOR) 20 MG tablet, Take 1 tablet (20 mg total) by mouth daily., Disp: 90 tablet, Rfl: 2 .  vitamin B-12 (CYANOCOBALAMIN) 100 MCG tablet, Take 100 mcg by mouth daily., Disp: , Rfl:  .  vitamin C (ASCORBIC ACID) 500 MG tablet, Take 500 mg by mouth as needed. , Disp: , Rfl:  .  VITAMIN D PO, Take 1 capsule by mouth daily., Disp: , Rfl:   Allergies:  Allergies  Allergen Reactions  . Lactose Intolerance  (Gi) Diarrhea    Past Medical History, Surgical history, Social history, and Family History were reviewed and updated.  Review of Systems: Review of Systems  Constitutional: Negative.   HENT:  Negative.   Eyes: Negative.   Respiratory: Negative.   Cardiovascular: Negative.   Gastrointestinal: Negative.   Endocrine: Negative.   Genitourinary: Negative.    Musculoskeletal: Negative.   Skin: Negative.   Neurological: Negative.   Hematological: Negative.   Psychiatric/Behavioral: Negative.     Physical Exam:  weight is 224 lb 8 oz (101.8 kg). His oral temperature is 97.9 F (36.6 C). His blood pressure is 108/66 and his pulse is 97. His respiration is 18 and oxygen saturation is 99%.   Wt Readings from Last 3 Encounters:  11/02/20 224 lb 8 oz (101.8 kg)  10/08/20 231 lb 6 oz (105 kg)  10/08/20 228 lb (103.4 kg)    Physical Exam Vitals reviewed.  HENT:     Head: Normocephalic and atraumatic.  Eyes:     Pupils: Pupils are equal, round, and reactive to light.  Cardiovascular:     Rate and Rhythm: Normal rate and regular rhythm.     Heart sounds: Normal heart sounds.  Pulmonary:     Effort: Pulmonary effort is normal.     Breath sounds: Normal breath sounds.  Abdominal:     General: Bowel sounds are normal.     Palpations: Abdomen is soft.  Musculoskeletal:        General: No tenderness or deformity. Normal range of motion.     Cervical back: Normal range of motion.  Lymphadenopathy:     Cervical: No cervical adenopathy.  Skin:    General: Skin is warm and dry.     Findings: No erythema or rash.  Neurological:     Mental Status: He is alert and oriented to person, place, and time.  Psychiatric:        Behavior: Behavior normal.        Thought Content: Thought content normal.        Judgment: Judgment normal.      Lab Results  Component Value Date   WBC 6.1 10/22/2020   HGB 10.9 (L) 10/22/2020   HCT 33.8 (L) 10/22/2020   MCV 91.8 10/22/2020   PLT 107 (L)  10/22/2020     Chemistry      Component Value Date/Time   NA 142 10/15/2020 0919   NA 146 (H) 07/30/2020 1555   K 3.8 10/15/2020 0919   CL 108 10/15/2020 0919   CO2 27 10/15/2020 0919   BUN 29 (H) 10/15/2020 0919   BUN 28 (H) 07/30/2020 1555  CREATININE 1.96 (H) 10/15/2020 0919   CREATININE 1.85 (H) 09/02/2020 1411   CREATININE 1.95 (H) 09/08/2016 0859      Component Value Date/Time   CALCIUM 9.3 10/15/2020 0919   ALKPHOS 67 10/15/2020 0919   AST 23 10/15/2020 0919   AST 28 09/02/2020 1411   ALT 22 10/15/2020 0919   ALT 27 09/02/2020 1411   BILITOT 0.8 10/15/2020 0919   BILITOT 0.8 09/02/2020 1411      Impression and Plan: [name] is a very nice 70 year old African-American male.  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  He has IgG kappa myeloma.  He has had 5 cycles of treatment so far.  Again, the bone marrow show that he is in a good remission.  I have said this is a very good or full remission.  You has 1% plasma cells.  Again, I will have to see if he would be a candidate for stem cell transplant.  Until then, we will continue with treatment.  He will start his sixth cycle of treatment this Friday.  I am just happy that he has done well.  His quality of life is still quite nice.  I will plan to see him back here in about a month or so.  Again, we can change things depending on his transplant status.    Volanda Napoleon, MD 2/8/20228:07 AM

## 2020-11-03 ENCOUNTER — Other Ambulatory Visit: Payer: Medicare Other

## 2020-11-03 ENCOUNTER — Other Ambulatory Visit: Payer: Self-pay | Admitting: Internal Medicine

## 2020-11-03 ENCOUNTER — Ambulatory Visit: Payer: Medicare Other | Admitting: Hematology & Oncology

## 2020-11-03 LAB — KAPPA/LAMBDA LIGHT CHAINS
Kappa free light chain: 14.4 mg/L (ref 3.3–19.4)
Kappa, lambda light chain ratio: 1.23 (ref 0.26–1.65)
Lambda free light chains: 11.7 mg/L (ref 5.7–26.3)

## 2020-11-03 LAB — IGG, IGA, IGM
IgA: 37 mg/dL — ABNORMAL LOW (ref 61–437)
IgG (Immunoglobin G), Serum: 400 mg/dL — ABNORMAL LOW (ref 603–1613)
IgM (Immunoglobulin M), Srm: 15 mg/dL — ABNORMAL LOW (ref 20–172)

## 2020-11-04 ENCOUNTER — Ambulatory Visit: Payer: Medicare Other

## 2020-11-04 ENCOUNTER — Other Ambulatory Visit: Payer: Medicare Other

## 2020-11-04 ENCOUNTER — Ambulatory Visit: Payer: Medicare Other | Admitting: Hematology & Oncology

## 2020-11-04 LAB — PROTEIN ELECTROPHORESIS, SERUM, WITH REFLEX
A/G Ratio: 1.8 — ABNORMAL HIGH (ref 0.7–1.7)
Albumin ELP: 3.5 g/dL (ref 2.9–4.4)
Alpha-1-Globulin: 0.2 g/dL (ref 0.0–0.4)
Alpha-2-Globulin: 0.6 g/dL (ref 0.4–1.0)
Beta Globulin: 0.9 g/dL (ref 0.7–1.3)
Gamma Globulin: 0.4 g/dL (ref 0.4–1.8)
Globulin, Total: 2 g/dL — ABNORMAL LOW (ref 2.2–3.9)
Total Protein ELP: 5.5 g/dL — ABNORMAL LOW (ref 6.0–8.5)

## 2020-11-05 ENCOUNTER — Other Ambulatory Visit: Payer: Self-pay

## 2020-11-05 ENCOUNTER — Inpatient Hospital Stay: Payer: Medicare Other

## 2020-11-05 VITALS — BP 122/78 | HR 94 | Temp 97.7°F | Resp 18

## 2020-11-05 DIAGNOSIS — C9000 Multiple myeloma not having achieved remission: Secondary | ICD-10-CM

## 2020-11-05 DIAGNOSIS — Z79899 Other long term (current) drug therapy: Secondary | ICD-10-CM | POA: Diagnosis not present

## 2020-11-05 DIAGNOSIS — Z5111 Encounter for antineoplastic chemotherapy: Secondary | ICD-10-CM | POA: Diagnosis not present

## 2020-11-05 DIAGNOSIS — Z5112 Encounter for antineoplastic immunotherapy: Secondary | ICD-10-CM | POA: Diagnosis not present

## 2020-11-05 MED ORDER — SODIUM CHLORIDE 0.9 % IV SOLN
20.0000 mg | Freq: Once | INTRAVENOUS | Status: AC
  Administered 2020-11-05: 20 mg via INTRAVENOUS
  Filled 2020-11-05: qty 20

## 2020-11-05 MED ORDER — SODIUM CHLORIDE 0.9 % IV SOLN
400.0000 mg/m2 | Freq: Once | INTRAVENOUS | Status: AC
  Administered 2020-11-05: 900 mg via INTRAVENOUS
  Filled 2020-11-05: qty 45

## 2020-11-05 MED ORDER — PALONOSETRON HCL INJECTION 0.25 MG/5ML
0.2500 mg | Freq: Once | INTRAVENOUS | Status: AC
  Administered 2020-11-05: 0.25 mg via INTRAVENOUS

## 2020-11-05 MED ORDER — PALONOSETRON HCL INJECTION 0.25 MG/5ML
INTRAVENOUS | Status: AC
  Filled 2020-11-05: qty 5

## 2020-11-05 MED ORDER — BORTEZOMIB CHEMO SQ INJECTION 3.5 MG (2.5MG/ML)
1.3000 mg/m2 | Freq: Once | INTRAMUSCULAR | Status: AC
  Administered 2020-11-05: 3 mg via SUBCUTANEOUS
  Filled 2020-11-05: qty 1.2

## 2020-11-05 MED ORDER — SODIUM CHLORIDE 0.9 % IV SOLN
Freq: Once | INTRAVENOUS | Status: AC
  Filled 2020-11-05: qty 250

## 2020-11-05 NOTE — Patient Instructions (Signed)
[place] Discharge Instructions for Patients Receiving Chemotherapy  Today you received the following chemotherapy agents Cytoxan, velcade.  To help prevent nausea and vomiting after your treatment, we encourage you to take your nausea medication as indicated by your MD.   If you develop nausea and vomiting that is not controlled by your nausea medication, call the clinic.   BELOW ARE SYMPTOMS THAT SHOULD BE REPORTED IMMEDIATELY:  *FEVER GREATER THAN 100.5 F  *CHILLS WITH OR WITHOUT FEVER  NAUSEA AND VOMITING THAT IS NOT CONTROLLED WITH YOUR NAUSEA MEDICATION  *UNUSUAL SHORTNESS OF BREATH  *UNUSUAL BRUISING OR BLEEDING  TENDERNESS IN MOUTH AND THROAT WITH OR WITHOUT PRESENCE OF ULCERS  *URINARY PROBLEMS  *BOWEL PROBLEMS  UNUSUAL RASH Items with * indicate a potential emergency and should be followed up as soon as possible.  Feel free to call the clinic should you have any questions or concerns. The clinic phone number is [phone].  Please show the [place] at check-in to the Emergency Department and triage nurse.

## 2020-11-12 ENCOUNTER — Other Ambulatory Visit: Payer: Self-pay

## 2020-11-12 ENCOUNTER — Inpatient Hospital Stay: Payer: Medicare Other

## 2020-11-12 VITALS — BP 115/73 | HR 64 | Temp 98.3°F | Resp 18

## 2020-11-12 DIAGNOSIS — Z5111 Encounter for antineoplastic chemotherapy: Secondary | ICD-10-CM | POA: Diagnosis not present

## 2020-11-12 DIAGNOSIS — C9000 Multiple myeloma not having achieved remission: Secondary | ICD-10-CM

## 2020-11-12 DIAGNOSIS — Z5112 Encounter for antineoplastic immunotherapy: Secondary | ICD-10-CM | POA: Diagnosis not present

## 2020-11-12 DIAGNOSIS — Z79899 Other long term (current) drug therapy: Secondary | ICD-10-CM | POA: Diagnosis not present

## 2020-11-12 LAB — CBC WITH DIFFERENTIAL/PLATELET
Abs Immature Granulocytes: 0.02 10*3/uL (ref 0.00–0.07)
Basophils Absolute: 0 10*3/uL (ref 0.0–0.1)
Basophils Relative: 0 %
Eosinophils Absolute: 0.1 10*3/uL (ref 0.0–0.5)
Eosinophils Relative: 1 %
HCT: 33.1 % — ABNORMAL LOW (ref 39.0–52.0)
Hemoglobin: 10.9 g/dL — ABNORMAL LOW (ref 13.0–17.0)
Immature Granulocytes: 0 %
Lymphocytes Relative: 10 %
Lymphs Abs: 0.5 10*3/uL — ABNORMAL LOW (ref 0.7–4.0)
MCH: 29.5 pg (ref 26.0–34.0)
MCHC: 32.9 g/dL (ref 30.0–36.0)
MCV: 89.5 fL (ref 80.0–100.0)
Monocytes Absolute: 1 10*3/uL (ref 0.1–1.0)
Monocytes Relative: 19 %
Neutro Abs: 3.7 10*3/uL (ref 1.7–7.7)
Neutrophils Relative %: 70 %
Platelets: 105 10*3/uL — ABNORMAL LOW (ref 150–400)
RBC: 3.7 MIL/uL — ABNORMAL LOW (ref 4.22–5.81)
RDW: 18.6 % — ABNORMAL HIGH (ref 11.5–15.5)
WBC: 5.3 10*3/uL (ref 4.0–10.5)
nRBC: 0 % (ref 0.0–0.2)

## 2020-11-12 LAB — COMPREHENSIVE METABOLIC PANEL
ALT: 22 U/L (ref 0–44)
AST: 23 U/L (ref 15–41)
Albumin: 3.8 g/dL (ref 3.5–5.0)
Alkaline Phosphatase: 66 U/L (ref 38–126)
Anion gap: 8 (ref 5–15)
BUN: 33 mg/dL — ABNORMAL HIGH (ref 8–23)
CO2: 27 mmol/L (ref 22–32)
Calcium: 8.8 mg/dL — ABNORMAL LOW (ref 8.9–10.3)
Chloride: 106 mmol/L (ref 98–111)
Creatinine, Ser: 1.81 mg/dL — ABNORMAL HIGH (ref 0.61–1.24)
GFR, Estimated: 40 mL/min — ABNORMAL LOW (ref >60)
Glucose, Bld: 148 mg/dL — ABNORMAL HIGH (ref 70–99)
Potassium: 3.6 mmol/L (ref 3.5–5.1)
Sodium: 141 mmol/L (ref 135–145)
Total Bilirubin: 0.6 mg/dL (ref 0.3–1.2)
Total Protein: 5.7 g/dL — ABNORMAL LOW (ref 6.5–8.1)

## 2020-11-12 MED ORDER — SODIUM CHLORIDE 0.9 % IV SOLN
20.0000 mg | Freq: Once | INTRAVENOUS | Status: AC
  Administered 2020-11-12: 20 mg via INTRAVENOUS
  Filled 2020-11-12: qty 20

## 2020-11-12 MED ORDER — PALONOSETRON HCL INJECTION 0.25 MG/5ML
0.2500 mg | Freq: Once | INTRAVENOUS | Status: AC
  Administered 2020-11-12: 0.25 mg via INTRAVENOUS

## 2020-11-12 MED ORDER — BORTEZOMIB CHEMO SQ INJECTION 3.5 MG (2.5MG/ML)
1.3000 mg/m2 | Freq: Once | INTRAMUSCULAR | Status: AC
  Administered 2020-11-12: 3 mg via SUBCUTANEOUS
  Filled 2020-11-12: qty 1.2

## 2020-11-12 MED ORDER — SODIUM CHLORIDE 0.9 % IV SOLN
400.0000 mg/m2 | Freq: Once | INTRAVENOUS | Status: AC
  Administered 2020-11-12: 900 mg via INTRAVENOUS
  Filled 2020-11-12: qty 45

## 2020-11-12 MED ORDER — SODIUM CHLORIDE 0.9 % IV SOLN
Freq: Once | INTRAVENOUS | Status: AC
  Filled 2020-11-12: qty 250

## 2020-11-12 MED ORDER — PALONOSETRON HCL INJECTION 0.25 MG/5ML
INTRAVENOUS | Status: AC
  Filled 2020-11-12: qty 5

## 2020-11-12 NOTE — Patient Instructions (Signed)
Bortezomib injection What is this medicine? BORTEZOMIB (bor TEZ oh mib) targets proteins in cancer cells and stops the cancer cells from growing. It treats multiple myeloma and mantle cell lymphoma. This medicine may be used for other purposes; ask your health care provider or pharmacist if you have questions. COMMON BRAND NAME(S): Velcade What should I tell my health care provider before I take this medicine? They need to know if you have any of these conditions:  dehydration  diabetes (high blood sugar)  heart disease  liver disease  tingling of the fingers or toes or other nerve disorder  an unusual or allergic reaction to bortezomib, mannitol, boron, other medicines, foods, dyes, or preservatives  pregnant or trying to get pregnant  breast-feeding How should I use this medicine? This medicine is injected into a vein or under the skin. It is given by a health care provider in a hospital or clinic setting. Talk to your health care provider about the use of this medicine in children. Special care may be needed. Overdosage: If you think you have taken too much of this medicine contact a poison control center or emergency room at once. NOTE: This medicine is only for you. Do not share this medicine with others. What if I miss a dose? Keep appointments for follow-up doses. It is important not to miss your dose. Call your health care provider if you are unable to keep an appointment. What may interact with this medicine? This medicine may interact with the following medications:  ketoconazole  rifampin This list may not describe all possible interactions. Give your health care provider a list of all the medicines, herbs, non-prescription drugs, or dietary supplements you use. Also tell them if you smoke, drink alcohol, or use illegal drugs. Some items may interact with your medicine. What should I watch for while using this medicine? Your condition will be monitored carefully while  you are receiving this medicine. You may need blood work done while you are taking this medicine. You may get drowsy or dizzy. Do not drive, use machinery, or do anything that needs mental alertness until you know how this medicine affects you. Do not stand up or sit up quickly, especially if you are an older patient. This reduces the risk of dizzy or fainting spells This medicine may increase your risk of getting an infection. Call your health care provider for advice if you get a fever, chills, sore throat, or other symptoms of a cold or flu. Do not treat yourself. Try to avoid being around people who are sick. Check with your health care provider if you have severe diarrhea, nausea, and vomiting, or if you sweat a lot. The loss of too much body fluid may make it dangerous for you to take this medicine. Do not become pregnant while taking this medicine or for 7 months after stopping it. Women should inform their health care provider if they wish to become pregnant or think they might be pregnant. Men should not father a child while taking this medicine and for 4 months after stopping it. There is a potential for serious harm to an unborn child. Talk to your health care provider for more information. Do not breast-feed an infant while taking this medicine or for 2 months after stopping it. This medicine may make it more difficult to get pregnant or father a child. Talk to your health care provider if you are concerned about your fertility. What side effects may I notice from receiving this medicine?   Side effects that you should report to your doctor or health care professional as soon as possible:  allergic reactions (skin rash; itching or hives; swelling of the face, lips, or tongue)  bleeding (bloody or black, tarry stools; red or dark brown urine; spitting up blood or brown material that looks like coffee grounds; red spots on the skin; unusual bruising or bleeding from the eye, gums, or  nose)  blurred vision or changes in vision  confusion  constipation  headache  heart failure (trouble breathing; fast, irregular heartbeat; sudden weight gain; swelling of the ankles, feet, hands)  infection (fever, chills, cough, sore throat, pain or trouble passing urine)  lack or loss of appetite  liver injury (dark yellow or brown urine; general ill feeling or flu-like symptoms; loss of appetite, right upper belly pain; yellowing of the eyes or skin)  low blood pressure (dizziness; feeling faint or lightheaded, falls; unusually weak or tired)  muscle cramps  pain, redness, or irritation at site where injected  pain, tingling, numbness in the hands or feet  seizures  trouble breathing  unusual bruising or bleeding Side effects that usually do not require medical attention (report to your doctor or health care professional if they continue or are bothersome):  diarrhea  nausea  stomach pain  trouble sleeping  vomiting This list may not describe all possible side effects. Call your doctor for medical advice about side effects. You may report side effects to FDA at 1-800-FDA-1088. Where should I keep my medicine? This medicine is given in a hospital or clinic. It will not be stored at home. NOTE: This sheet is a summary. It may not cover all possible information. If you have questions about this medicine, talk to your doctor, pharmacist, or health care provider.  2021 Elsevier/Gold Standard (2020-09-02 13:22:53) Cyclophosphamide Injection What is this medicine? CYCLOPHOSPHAMIDE (sye kloe FOSS fa mide) is a chemotherapy drug. It slows the growth of cancer cells. This medicine is used to treat many types of cancer like lymphoma, myeloma, leukemia, breast cancer, and ovarian cancer, to name a few. This medicine may be used for other purposes; ask your health care provider or pharmacist if you have questions. COMMON BRAND NAME(S): Cytoxan, Neosar What should I tell my  health care provider before I take this medicine? They need to know if you have any of these conditions:  heart disease  history of irregular heartbeat  infection  kidney disease  liver disease  low blood counts, like white cells, platelets, or red blood cells  on hemodialysis  recent or ongoing radiation therapy  scarring or thickening of the lungs  trouble passing urine  an unusual or allergic reaction to cyclophosphamide, other medicines, foods, dyes, or preservatives  pregnant or trying to get pregnant  breast-feeding How should I use this medicine? This drug is usually given as an injection into a vein or muscle or by infusion into a vein. It is administered in a hospital or clinic by a specially trained health care professional. Talk to your pediatrician regarding the use of this medicine in children. Special care may be needed. Overdosage: If you think you have taken too much of this medicine contact a poison control center or emergency room at once. NOTE: This medicine is only for you. Do not share this medicine with others. What if I miss a dose? It is important not to miss your dose. Call your doctor or health care professional if you are unable to keep an appointment. What may  interact with this medicine?  amphotericin B  azathioprine  certain antivirals for HIV or hepatitis  certain medicines for blood pressure, heart disease, irregular heart beat  certain medicines that treat or prevent blood clots like warfarin  certain other medicines for cancer  cyclosporine  etanercept  indomethacin  medicines that relax muscles for surgery  medicines to increase blood counts  metronidazole This list may not describe all possible interactions. Give your health care provider a list of all the medicines, herbs, non-prescription drugs, or dietary supplements you use. Also tell them if you smoke, drink alcohol, or use illegal drugs. Some items may interact with  your medicine. What should I watch for while using this medicine? Your condition will be monitored carefully while you are receiving this medicine. You may need blood work done while you are taking this medicine. Drink water or other fluids as directed. Urinate often, even at night. Some products may contain alcohol. Ask your health care professional if this medicine contains alcohol. Be sure to tell all health care professionals you are taking this medicine. Certain medicines, like metronidazole and disulfiram, can cause an unpleasant reaction when taken with alcohol. The reaction includes flushing, headache, nausea, vomiting, sweating, and increased thirst. The reaction can last from 30 minutes to several hours. Do not become pregnant while taking this medicine or for 1 year after stopping it. Women should inform their health care professional if they wish to become pregnant or think they might be pregnant. Men should not father a child while taking this medicine and for 4 months after stopping it. There is potential for serious side effects to an unborn child. Talk to your health care professional for more information. Do not breast-feed an infant while taking this medicine or for 1 week after stopping it. This medicine has caused ovarian failure in some women. This medicine may make it more difficult to get pregnant. Talk to your health care professional if you are concerned about your fertility. This medicine has caused decreased sperm counts in some men. This may make it more difficult to father a child. Talk to your health care professional if you are concerned about your fertility. Call your health care professional for advice if you get a fever, chills, or sore throat, or other symptoms of a cold or flu. Do not treat yourself. This medicine decreases your body's ability to fight infections. Try to avoid being around people who are sick. Avoid taking medicines that contain aspirin, acetaminophen,  ibuprofen, naproxen, or ketoprofen unless instructed by your health care professional. These medicines may hide a fever. Talk to your health care professional about your risk of cancer. You may be more at risk for certain types of cancer if you take this medicine. If you are going to need surgery or other procedure, tell your health care professional that you are using this medicine. Be careful brushing or flossing your teeth or using a toothpick because you may get an infection or bleed more easily. If you have any dental work done, tell your dentist you are receiving this medicine. What side effects may I notice from receiving this medicine? Side effects that you should report to your doctor or health care professional as soon as possible:  allergic reactions like skin rash, itching or hives, swelling of the face, lips, or tongue  breathing problems  nausea, vomiting  signs and symptoms of bleeding such as bloody or black, tarry stools; red or dark brown urine; spitting up blood or brown  material that looks like coffee grounds; red spots on the skin; unusual bruising or bleeding from the eyes, gums, or nose  signs and symptoms of heart failure like fast, irregular heartbeat, sudden weight gain; swelling of the ankles, feet, hands  signs and symptoms of infection like fever; chills; cough; sore throat; pain or trouble passing urine  signs and symptoms of kidney injury like trouble passing urine or change in the amount of urine  signs and symptoms of liver injury like dark yellow or brown urine; general ill feeling or flu-like symptoms; light-colored stools; loss of appetite; nausea; right upper belly pain; unusually weak or tired; yellowing of the eyes or skin Side effects that usually do not require medical attention (report to your doctor or health care professional if they continue or are bothersome):  confusion  decreased hearing  diarrhea  facial flushing  hair  loss  headache  loss of appetite  missed menstrual periods  signs and symptoms of low red blood cells or anemia such as unusually weak or tired; feeling faint or lightheaded; falls  skin discoloration This list may not describe all possible side effects. Call your doctor for medical advice about side effects. You may report side effects to FDA at 1-800-FDA-1088. Where should I keep my medicine? This drug is given in a hospital or clinic and will not be stored at home. NOTE: This sheet is a summary. It may not cover all possible information. If you have questions about this medicine, talk to your doctor, pharmacist, or health care provider.  2021 Elsevier/Gold Standard (2019-06-16 09:53:29)

## 2020-11-12 NOTE — Progress Notes (Signed)
Okay to treat today with SCr 1.81 per Dr. Marin Olp.

## 2020-11-19 ENCOUNTER — Inpatient Hospital Stay: Payer: Medicare Other

## 2020-11-19 ENCOUNTER — Ambulatory Visit: Payer: Medicare Other

## 2020-11-19 ENCOUNTER — Other Ambulatory Visit: Payer: Self-pay

## 2020-11-19 VITALS — BP 106/64 | HR 70 | Temp 98.2°F | Resp 17

## 2020-11-19 DIAGNOSIS — C9000 Multiple myeloma not having achieved remission: Secondary | ICD-10-CM

## 2020-11-19 DIAGNOSIS — Z5112 Encounter for antineoplastic immunotherapy: Secondary | ICD-10-CM | POA: Diagnosis not present

## 2020-11-19 DIAGNOSIS — Z5111 Encounter for antineoplastic chemotherapy: Secondary | ICD-10-CM | POA: Diagnosis not present

## 2020-11-19 DIAGNOSIS — Z79899 Other long term (current) drug therapy: Secondary | ICD-10-CM | POA: Diagnosis not present

## 2020-11-19 LAB — COMPREHENSIVE METABOLIC PANEL
ALT: 26 U/L (ref 0–44)
AST: 30 U/L (ref 15–41)
Albumin: 3.7 g/dL (ref 3.5–5.0)
Alkaline Phosphatase: 66 U/L (ref 38–126)
Anion gap: 7 (ref 5–15)
BUN: 40 mg/dL — ABNORMAL HIGH (ref 8–23)
CO2: 27 mmol/L (ref 22–32)
Calcium: 8.7 mg/dL — ABNORMAL LOW (ref 8.9–10.3)
Chloride: 109 mmol/L (ref 98–111)
Creatinine, Ser: 1.93 mg/dL — ABNORMAL HIGH (ref 0.61–1.24)
GFR, Estimated: 37 mL/min — ABNORMAL LOW (ref >60)
Glucose, Bld: 168 mg/dL — ABNORMAL HIGH (ref 70–99)
Potassium: 4 mmol/L (ref 3.5–5.1)
Sodium: 143 mmol/L (ref 135–145)
Total Bilirubin: 0.6 mg/dL (ref 0.3–1.2)
Total Protein: 5.3 g/dL — ABNORMAL LOW (ref 6.5–8.1)

## 2020-11-19 LAB — CBC WITH DIFFERENTIAL/PLATELET
Abs Immature Granulocytes: 0.01 10*3/uL (ref 0.00–0.07)
Basophils Absolute: 0 10*3/uL (ref 0.0–0.1)
Basophils Relative: 0 %
Eosinophils Absolute: 0 10*3/uL (ref 0.0–0.5)
Eosinophils Relative: 1 %
HCT: 31.3 % — ABNORMAL LOW (ref 39.0–52.0)
Hemoglobin: 10.4 g/dL — ABNORMAL LOW (ref 13.0–17.0)
Immature Granulocytes: 0 %
Lymphocytes Relative: 7 %
Lymphs Abs: 0.4 10*3/uL — ABNORMAL LOW (ref 0.7–4.0)
MCH: 29.8 pg (ref 26.0–34.0)
MCHC: 33.2 g/dL (ref 30.0–36.0)
MCV: 89.7 fL (ref 80.0–100.0)
Monocytes Absolute: 0.8 10*3/uL (ref 0.1–1.0)
Monocytes Relative: 16 %
Neutro Abs: 3.9 10*3/uL (ref 1.7–7.7)
Neutrophils Relative %: 76 %
Platelets: 95 10*3/uL — ABNORMAL LOW (ref 150–400)
RBC: 3.49 MIL/uL — ABNORMAL LOW (ref 4.22–5.81)
RDW: 19.3 % — ABNORMAL HIGH (ref 11.5–15.5)
WBC: 5.1 10*3/uL (ref 4.0–10.5)
nRBC: 0 % (ref 0.0–0.2)

## 2020-11-19 MED ORDER — PALONOSETRON HCL INJECTION 0.25 MG/5ML
0.2500 mg | Freq: Once | INTRAVENOUS | Status: AC
  Administered 2020-11-19: 0.25 mg via INTRAVENOUS

## 2020-11-19 MED ORDER — PALONOSETRON HCL INJECTION 0.25 MG/5ML
INTRAVENOUS | Status: AC
  Filled 2020-11-19: qty 5

## 2020-11-19 MED ORDER — SODIUM CHLORIDE 0.9 % IV SOLN
20.0000 mg | Freq: Once | INTRAVENOUS | Status: AC
  Administered 2020-11-19: 20 mg via INTRAVENOUS
  Filled 2020-11-19: qty 20

## 2020-11-19 MED ORDER — SODIUM CHLORIDE 0.9 % IV SOLN
400.0000 mg/m2 | Freq: Once | INTRAVENOUS | Status: AC
  Administered 2020-11-19: 900 mg via INTRAVENOUS
  Filled 2020-11-19: qty 45

## 2020-11-19 MED ORDER — SODIUM CHLORIDE 0.9 % IV SOLN
Freq: Once | INTRAVENOUS | Status: AC
  Filled 2020-11-19: qty 250

## 2020-11-19 MED ORDER — BORTEZOMIB CHEMO SQ INJECTION 3.5 MG (2.5MG/ML)
1.3000 mg/m2 | Freq: Once | INTRAMUSCULAR | Status: AC
  Administered 2020-11-19: 3 mg via SUBCUTANEOUS
  Filled 2020-11-19: qty 1.2

## 2020-11-19 NOTE — Patient Instructions (Signed)
[place] Discharge Instructions for Patients Receiving Chemotherapy  Today you received the following chemotherapy agents Cytoxan, velcade.  To help prevent nausea and vomiting after your treatment, we encourage you to take your nausea medication as indicated by your MD.   If you develop nausea and vomiting that is not controlled by your nausea medication, call the clinic.   BELOW ARE SYMPTOMS THAT SHOULD BE REPORTED IMMEDIATELY:  *FEVER GREATER THAN 100.5 F  *CHILLS WITH OR WITHOUT FEVER  NAUSEA AND VOMITING THAT IS NOT CONTROLLED WITH YOUR NAUSEA MEDICATION  *UNUSUAL SHORTNESS OF BREATH  *UNUSUAL BRUISING OR BLEEDING  TENDERNESS IN MOUTH AND THROAT WITH OR WITHOUT PRESENCE OF ULCERS  *URINARY PROBLEMS  *BOWEL PROBLEMS  UNUSUAL RASH Items with * indicate a potential emergency and should be followed up as soon as possible.  Feel free to call the clinic should you have any questions or concerns. The clinic phone number is [phone].  Please show the [place] at check-in to the Emergency Department and triage nurse.

## 2020-11-19 NOTE — Progress Notes (Signed)
Reviewed pt labs with Dr. Marin Olp and pt ok to treat with platelets 95 and creatinine 1.93

## 2020-11-26 ENCOUNTER — Other Ambulatory Visit: Payer: Self-pay | Admitting: Hematology & Oncology

## 2020-11-26 NOTE — Progress Notes (Signed)
Careplan dates readjusted per Dr. Antonieta Pert instructions to 3 weeks on, 1 week off. Dates were off due to moved or missed appts.

## 2020-12-02 ENCOUNTER — Telehealth: Payer: Self-pay | Admitting: *Deleted

## 2020-12-02 NOTE — Telephone Encounter (Signed)
Message received from scheduler stating that the patient would like a call from a nurse regarding his appt at [facility] and his treatment schedule.  Call placed to patient and message left for him to return call to nurse.  Appt times reviewed for 12/03/20.

## 2020-12-03 ENCOUNTER — Other Ambulatory Visit: Payer: Self-pay

## 2020-12-03 ENCOUNTER — Inpatient Hospital Stay: Payer: Medicare Other | Admitting: Hematology & Oncology

## 2020-12-03 ENCOUNTER — Inpatient Hospital Stay: Payer: Medicare Other

## 2020-12-03 ENCOUNTER — Encounter: Payer: Self-pay | Admitting: Hematology & Oncology

## 2020-12-03 ENCOUNTER — Other Ambulatory Visit: Payer: Medicare Other

## 2020-12-03 ENCOUNTER — Inpatient Hospital Stay (HOSPITAL_BASED_OUTPATIENT_CLINIC_OR_DEPARTMENT_OTHER): Payer: Medicare Other | Admitting: Hematology & Oncology

## 2020-12-03 ENCOUNTER — Inpatient Hospital Stay: Payer: Medicare Other | Attending: Family

## 2020-12-03 ENCOUNTER — Encounter: Payer: Self-pay | Admitting: *Deleted

## 2020-12-03 VITALS — BP 111/72 | HR 71 | Temp 98.2°F | Resp 16

## 2020-12-03 VITALS — Wt 229.0 lb

## 2020-12-03 DIAGNOSIS — C9000 Multiple myeloma not having achieved remission: Secondary | ICD-10-CM | POA: Diagnosis not present

## 2020-12-03 DIAGNOSIS — Z5112 Encounter for antineoplastic immunotherapy: Secondary | ICD-10-CM | POA: Insufficient documentation

## 2020-12-03 DIAGNOSIS — Z79899 Other long term (current) drug therapy: Secondary | ICD-10-CM | POA: Diagnosis not present

## 2020-12-03 DIAGNOSIS — Z5111 Encounter for antineoplastic chemotherapy: Secondary | ICD-10-CM | POA: Insufficient documentation

## 2020-12-03 DIAGNOSIS — C9001 Multiple myeloma in remission: Secondary | ICD-10-CM

## 2020-12-03 LAB — CBC WITH DIFFERENTIAL/PLATELET
Abs Immature Granulocytes: 0.01 10*3/uL (ref 0.00–0.07)
Basophils Absolute: 0 10*3/uL (ref 0.0–0.1)
Basophils Relative: 1 %
Eosinophils Absolute: 0 10*3/uL (ref 0.0–0.5)
Eosinophils Relative: 1 %
HCT: 31.9 % — ABNORMAL LOW (ref 39.0–52.0)
Hemoglobin: 10.4 g/dL — ABNORMAL LOW (ref 13.0–17.0)
Immature Granulocytes: 0 %
Lymphocytes Relative: 12 %
Lymphs Abs: 0.5 10*3/uL — ABNORMAL LOW (ref 0.7–4.0)
MCH: 29.9 pg (ref 26.0–34.0)
MCHC: 32.6 g/dL (ref 30.0–36.0)
MCV: 91.7 fL (ref 80.0–100.0)
Monocytes Absolute: 1 10*3/uL (ref 0.1–1.0)
Monocytes Relative: 24 %
Neutro Abs: 2.8 10*3/uL (ref 1.7–7.7)
Neutrophils Relative %: 62 %
Platelets: 168 10*3/uL (ref 150–400)
RBC: 3.48 MIL/uL — ABNORMAL LOW (ref 4.22–5.81)
RDW: 19 % — ABNORMAL HIGH (ref 11.5–15.5)
WBC: 4.4 10*3/uL (ref 4.0–10.5)
nRBC: 0 % (ref 0.0–0.2)

## 2020-12-03 LAB — COMPREHENSIVE METABOLIC PANEL
ALT: 18 U/L (ref 0–44)
AST: 24 U/L (ref 15–41)
Albumin: 3.8 g/dL (ref 3.5–5.0)
Alkaline Phosphatase: 67 U/L (ref 38–126)
Anion gap: 6 (ref 5–15)
BUN: 21 mg/dL (ref 8–23)
CO2: 29 mmol/L (ref 22–32)
Calcium: 8.8 mg/dL — ABNORMAL LOW (ref 8.9–10.3)
Chloride: 107 mmol/L (ref 98–111)
Creatinine, Ser: 1.73 mg/dL — ABNORMAL HIGH (ref 0.61–1.24)
GFR, Estimated: 42 mL/min — ABNORMAL LOW (ref >60)
Glucose, Bld: 177 mg/dL — ABNORMAL HIGH (ref 70–99)
Potassium: 3.9 mmol/L (ref 3.5–5.1)
Sodium: 142 mmol/L (ref 135–145)
Total Bilirubin: 0.6 mg/dL (ref 0.3–1.2)
Total Protein: 5.3 g/dL — ABNORMAL LOW (ref 6.5–8.1)

## 2020-12-03 MED ORDER — PALONOSETRON HCL INJECTION 0.25 MG/5ML
INTRAVENOUS | Status: AC
  Filled 2020-12-03: qty 5

## 2020-12-03 MED ORDER — PALONOSETRON HCL INJECTION 0.25 MG/5ML
0.2500 mg | Freq: Once | INTRAVENOUS | Status: AC
  Administered 2020-12-03: 0.25 mg via INTRAVENOUS

## 2020-12-03 MED ORDER — CYCLOPHOSPHAMIDE CHEMO INJECTION 1 GM
400.0000 mg/m2 | Freq: Once | INTRAMUSCULAR | Status: AC
  Administered 2020-12-03: 900 mg via INTRAVENOUS
  Filled 2020-12-03: qty 45

## 2020-12-03 MED ORDER — SODIUM CHLORIDE 0.9 % IV SOLN
Freq: Once | INTRAVENOUS | Status: AC
  Filled 2020-12-03: qty 250

## 2020-12-03 MED ORDER — SODIUM CHLORIDE 0.9 % IV SOLN
20.0000 mg | Freq: Once | INTRAVENOUS | Status: AC
  Administered 2020-12-03: 20 mg via INTRAVENOUS
  Filled 2020-12-03: qty 20

## 2020-12-03 MED ORDER — FAMCICLOVIR 500 MG PO TABS: 500.0000 mg | ORAL_TABLET | Freq: Every day | ORAL | 3 refills | Status: DC

## 2020-12-03 MED ORDER — BORTEZOMIB CHEMO SQ INJECTION 3.5 MG (2.5MG/ML)
1.3000 mg/m2 | Freq: Once | INTRAMUSCULAR | Status: AC
  Administered 2020-12-03: 3 mg via SUBCUTANEOUS
  Filled 2020-12-03: qty 1.2

## 2020-12-03 NOTE — Progress Notes (Signed)
Ok to treat with creatinine of 1.73 per Dr Ennever. dph 

## 2020-12-03 NOTE — Progress Notes (Signed)
Hematology and Oncology Follow Up Visit  Eric [name]., MD [id number] 1951/03/05 70 y.o. 12/03/2020   Principle Diagnosis:   IgG kappa myeloma  - 1q+  Current Therapy:    CyBorD -- s/p cycle #6  - start on 06/04/2020     Interim History:  Eric [name] is back for follow-up.  Unfortunately, he still not seeing Dr. Laverta Baltimore at [facility] for the consideration of stem cell transplant.  Again, I think this is a very tenuous proposition for him.  He is still doing all right with treatment.  He still has had a nice response to date.  His last monoclonal studies back in February showed a not detectable M spike.  His IgG level was 400 mg/dL.  The Kappa light chain was 1.4 mg/dL.  He is still working.  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  He has had no problems with worsening renal insufficiency.  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  He has had no problems with bleeding.  There is no change in bowel or bladder habits.  He has had no cough or shortness of breath.  He has had no pain issues.  He has had no increase in leg swelling.  Overall, I would say his performance status is ECOG 1.    Medications:  Current Outpatient Medications:  .  acetaminophen (TYLENOL) 500 MG tablet, Take 1,000 mg by mouth every 4 (four) hours as needed for moderate pain or headache., Disp: , Rfl:  .  amiodarone (PACERONE) 100 MG tablet, TAKE 1 TABLET(100 MG) BY MOUTH DAILY. PLEASE MAKE OVERDUE APPOINTMENT WITH DOCTOR TAYLOR BEFORE ANYMORE REFILLS. THANK YOU FIRST ATTEMPT, Disp: 90 tablet, Rfl: 3 .  apixaban (ELIQUIS) 5 MG TABS tablet, Take 1 tablet (5 mg total) by mouth 2 (two) times daily., Disp: 60 tablet, Rfl: 5 .  carvedilol (COREG) 6.25 MG tablet, TAKE 1 TABLET(6.25 MG) BY MOUTH TWICE DAILY, Disp: 180 tablet, Rfl: 1 .  docusate sodium (COLACE) 100 MG capsule, Take 100 mg by mouth daily as needed for mild constipation., Disp: , Rfl:  .  famciclovir  (FAMVIR) 500 MG tablet, Take 1 tablet (500 mg total) by mouth daily., Disp: 30 tablet, Rfl: 3 .  fluticasone (FLONASE) 50 MCG/ACT nasal spray, Place 2 sprays into the nose as needed for allergies. , Disp: , Rfl:  .  furosemide (LASIX) 80 MG tablet, Take 1 tablet (80 mg total) by mouth daily., Disp: 90 tablet, Rfl: 3 .  Hypromellose (ARTIFICIAL TEARS OP), Place 1 drop into both eyes daily as needed (dry eyes)., Disp: , Rfl:  .  ipratropium (ATROVENT) 0.06 % nasal spray, Place 2 sprays into both nostrils 2 (two) times daily., Disp: 15 mL, Rfl: 3 .  isosorbide-hydrALAZINE (BIDIL) 20-37.5 MG tablet, Take 1 tablet by mouth 3 (three) times daily., Disp: 270 tablet, Rfl: 3 .  levothyroxine (SYNTHROID) 75 MCG tablet, TAKE 1 TABLET BY MOUTH DAILY IN THE MORNING EXCEPT TAKE 1/2 TABLET ON SUNDAYS IN THE MORNING, Disp: 90 tablet, Rfl: 0 .  loperamide (IMODIUM) 2 MG capsule, Take 2 mg by mouth as needed for diarrhea or loose stools., Disp: , Rfl:  .  ondansetron (ZOFRAN) 8 MG tablet, Take 1 tablet (8 mg total) by mouth 2 (two) times daily as needed for refractory nausea / vomiting. Start on day 3 after Cytoxan., Disp: 30 tablet, Rfl: 1 .  potassium chloride 20 MEQ/15ML (10%) SOLN, Take 15 mLs (20 mEq  total) by mouth 2 (two) times daily. (Patient taking differently: Take 20 mEq by mouth daily.), Disp: 946 mL, Rfl: 6 .  prochlorperazine (COMPAZINE) 10 MG tablet, Take 1 tablet (10 mg total) by mouth every 6 (six) hours as needed (Nausea or vomiting)., Disp: 30 tablet, Rfl: 1 .  rosuvastatin (CRESTOR) 20 MG tablet, Take 1 tablet (20 mg total) by mouth daily., Disp: 90 tablet, Rfl: 2 .  vitamin B-12 (CYANOCOBALAMIN) 100 MCG tablet, Take 100 mcg by mouth daily., Disp: , Rfl:  .  vitamin C (ASCORBIC ACID) 500 MG tablet, Take 500 mg by mouth as needed. , Disp: , Rfl:  .  VITAMIN D PO, Take 1 capsule by mouth daily., Disp: , Rfl:   Allergies:  Allergies  Allergen Reactions  . Lactose Intolerance (Gi) Diarrhea     Past Medical History, Surgical history, Social history, and Family History were reviewed and updated.  Review of Systems: Review of Systems  Constitutional: Negative.   HENT:  Negative.   Eyes: Negative.   Respiratory: Negative.   Cardiovascular: Negative.   Gastrointestinal: Negative.   Endocrine: Negative.   Genitourinary: Negative.    Musculoskeletal: Negative.   Skin: Negative.   Neurological: Negative.   Hematological: Negative.   Psychiatric/Behavioral: Negative.     Physical Exam:  weight is 229 lb (103.9 kg).   Wt Readings from Last 3 Encounters:  12/03/20 229 lb (103.9 kg)  11/02/20 224 lb 8 oz (101.8 kg)  10/08/20 231 lb 6 oz (105 kg)    Physical Exam Vitals reviewed.  HENT:     Head: Normocephalic and atraumatic.  Eyes:     Pupils: Pupils are equal, round, and reactive to light.  Cardiovascular:     Rate and Rhythm: Normal rate and regular rhythm.     Heart sounds: Normal heart sounds.  Pulmonary:     Effort: Pulmonary effort is normal.     Breath sounds: Normal breath sounds.  Abdominal:     General: Bowel sounds are normal.     Palpations: Abdomen is soft.  Musculoskeletal:        General: No tenderness or deformity. Normal range of motion.     Cervical back: Normal range of motion.  Lymphadenopathy:     Cervical: No cervical adenopathy.  Skin:    General: Skin is warm and dry.     Findings: No erythema or rash.  Neurological:     Mental Status: He is alert and oriented to person, place, and time.  Psychiatric:        Behavior: Behavior normal.        Thought Content: Thought content normal.        Judgment: Judgment normal.      Lab Results  Component Value Date   WBC 4.4 12/03/2020   HGB 10.4 (L) 12/03/2020   HCT 31.9 (L) 12/03/2020   MCV 91.7 12/03/2020   PLT 168 12/03/2020     Chemistry      Component Value Date/Time   NA 142 12/03/2020 0806   NA 146 (H) 07/30/2020 1555   K 3.9 12/03/2020 0806   CL 107 12/03/2020 0806    CO2 29 12/03/2020 0806   BUN 21 12/03/2020 0806   BUN 28 (H) 07/30/2020 1555   CREATININE 1.73 (H) 12/03/2020 0806   CREATININE 2.40 (H) 11/02/2020 0754   CREATININE 1.95 (H) 09/08/2016 0859      Component Value Date/Time   CALCIUM 8.8 (L) 12/03/2020 0806   ALKPHOS 67 12/03/2020  0806   AST 24 12/03/2020 0806   AST 22 11/02/2020 0754   ALT 18 12/03/2020 0806   ALT 16 11/02/2020 0754   BILITOT 0.6 12/03/2020 0806   BILITOT 0.7 11/02/2020 0754      Impression and Plan: Eric [name] is a very nice 70 year old African-American male.  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  He has IgG kappa myeloma.  He has had 6 cycles of treatment so far.  Again, the bone marrow show that he is in a good remission.  I would think that this is almost a complete remission by the bone marrow report.  Again on his myeloma studies, there is no monoclonal spike in his blood.  We will have to see what the visit at [facility] with Dr. Laverta Baltimore shows.  Again, since he is responding well, I really do not think we have to make any change in the protocol.  I will plan to see him back in early April.  This will be after his meeting with Dr. Laverta Baltimore.  It will be interesting to see what Dr. Laverta Baltimore has to say.    Volanda Napoleon, MD 3/11/20229:22 AM

## 2020-12-03 NOTE — Progress Notes (Signed)
Okay to treat with SCr = 1.73 per Dr. Marin Olp.

## 2020-12-03 NOTE — Progress Notes (Signed)
Patient will be seen at [facility] on 12/20/2020 after completion of this cycle.   Oncology Nurse Navigator Documentation  Oncology Nurse Navigator Flowsheets 12/03/2020  Abnormal Finding Date -  Confirmed Diagnosis Date -  Diagnosis Status -  Planned Course of Treatment -  Phase of Treatment -  Chemotherapy Pending- Reason: -  Chemotherapy Actual Start Date: -  Navigator Follow Up Date: 12/20/2020  Navigator Follow Up Reason: Appointment Review  Navigator Location [facility]  Referral Date to RadOnc/MedOnc -  Navigator Encounter Type Treatment;Appt/Treatment Plan Review  Telephone -  Treatment Initiated Date -  Patient Visit Type MedOnc  Treatment Phase Active Tx  Barriers/Navigation Needs Coordination of Care;Education;Employed  Education -  Interventions Psycho-Social Support  Acuity Level 2-Minimal Needs (1-2 Barriers Identified)  Referrals -  Coordination of Care -  Education Method -  Support Groups/Services Friends and Family  Time Spent with Patient 15

## 2020-12-03 NOTE — Patient Instructions (Signed)
Bortezomib injection What is this medicine? BORTEZOMIB (bor TEZ oh mib) targets proteins in cancer cells and stops the cancer cells from growing. It treats multiple myeloma and mantle cell lymphoma. This medicine may be used for other purposes; ask your health care provider or pharmacist if you have questions. COMMON BRAND NAME(S): Velcade What should I tell my health care provider before I take this medicine? They need to know if you have any of these conditions:  dehydration  diabetes (high blood sugar)  heart disease  liver disease  tingling of the fingers or toes or other nerve disorder  an unusual or allergic reaction to bortezomib, mannitol, boron, other medicines, foods, dyes, or preservatives  pregnant or trying to get pregnant  breast-feeding How should I use this medicine? This medicine is injected into a vein or under the skin. It is given by a health care provider in a hospital or clinic setting. Talk to your health care provider about the use of this medicine in children. Special care may be needed. Overdosage: If you think you have taken too much of this medicine contact a poison control center or emergency room at once. NOTE: This medicine is only for you. Do not share this medicine with others. What if I miss a dose? Keep appointments for follow-up doses. It is important not to miss your dose. Call your health care provider if you are unable to keep an appointment. What may interact with this medicine? This medicine may interact with the following medications:  ketoconazole  rifampin This list may not describe all possible interactions. Give your health care provider a list of all the medicines, herbs, non-prescription drugs, or dietary supplements you use. Also tell them if you smoke, drink alcohol, or use illegal drugs. Some items may interact with your medicine. What should I watch for while using this medicine? Your condition will be monitored carefully while  you are receiving this medicine. You may need blood work done while you are taking this medicine. You may get drowsy or dizzy. Do not drive, use machinery, or do anything that needs mental alertness until you know how this medicine affects you. Do not stand up or sit up quickly, especially if you are an older patient. This reduces the risk of dizzy or fainting spells This medicine may increase your risk of getting an infection. Call your health care provider for advice if you get a fever, chills, sore throat, or other symptoms of a cold or flu. Do not treat yourself. Try to avoid being around people who are sick. Check with your health care provider if you have severe diarrhea, nausea, and vomiting, or if you sweat a lot. The loss of too much body fluid may make it dangerous for you to take this medicine. Do not become pregnant while taking this medicine or for 7 months after stopping it. Women should inform their health care provider if they wish to become pregnant or think they might be pregnant. Men should not father a child while taking this medicine and for 4 months after stopping it. There is a potential for serious harm to an unborn child. Talk to your health care provider for more information. Do not breast-feed an infant while taking this medicine or for 2 months after stopping it. This medicine may make it more difficult to get pregnant or father a child. Talk to your health care provider if you are concerned about your fertility. What side effects may I notice from receiving this medicine?   Side effects that you should report to your doctor or health care professional as soon as possible:  allergic reactions (skin rash; itching or hives; swelling of the face, lips, or tongue)  bleeding (bloody or black, tarry stools; red or dark brown urine; spitting up blood or brown material that looks like coffee grounds; red spots on the skin; unusual bruising or bleeding from the eye, gums, or  nose)  blurred vision or changes in vision  confusion  constipation  headache  heart failure (trouble breathing; fast, irregular heartbeat; sudden weight gain; swelling of the ankles, feet, hands)  infection (fever, chills, cough, sore throat, pain or trouble passing urine)  lack or loss of appetite  liver injury (dark yellow or brown urine; general ill feeling or flu-like symptoms; loss of appetite, right upper belly pain; yellowing of the eyes or skin)  low blood pressure (dizziness; feeling faint or lightheaded, falls; unusually weak or tired)  muscle cramps  pain, redness, or irritation at site where injected  pain, tingling, numbness in the hands or feet  seizures  trouble breathing  unusual bruising or bleeding Side effects that usually do not require medical attention (report to your doctor or health care professional if they continue or are bothersome):  diarrhea  nausea  stomach pain  trouble sleeping  vomiting This list may not describe all possible side effects. Call your doctor for medical advice about side effects. You may report side effects to FDA at 1-800-FDA-1088. Where should I keep my medicine? This medicine is given in a hospital or clinic. It will not be stored at home. NOTE: This sheet is a summary. It may not cover all possible information. If you have questions about this medicine, talk to your doctor, pharmacist, or health care provider.  2021 Elsevier/Gold Standard (2020-09-02 13:22:53) Cyclophosphamide Injection What is this medicine? CYCLOPHOSPHAMIDE (sye kloe FOSS fa mide) is a chemotherapy drug. It slows the growth of cancer cells. This medicine is used to treat many types of cancer like lymphoma, myeloma, leukemia, breast cancer, and ovarian cancer, to name a few. This medicine may be used for other purposes; ask your health care provider or pharmacist if you have questions. COMMON BRAND NAME(S): Cytoxan, Neosar What should I tell my  health care provider before I take this medicine? They need to know if you have any of these conditions:  heart disease  history of irregular heartbeat  infection  kidney disease  liver disease  low blood counts, like white cells, platelets, or red blood cells  on hemodialysis  recent or ongoing radiation therapy  scarring or thickening of the lungs  trouble passing urine  an unusual or allergic reaction to cyclophosphamide, other medicines, foods, dyes, or preservatives  pregnant or trying to get pregnant  breast-feeding How should I use this medicine? This drug is usually given as an injection into a vein or muscle or by infusion into a vein. It is administered in a hospital or clinic by a specially trained health care professional. Talk to your pediatrician regarding the use of this medicine in children. Special care may be needed. Overdosage: If you think you have taken too much of this medicine contact a poison control center or emergency room at once. NOTE: This medicine is only for you. Do not share this medicine with others. What if I miss a dose? It is important not to miss your dose. Call your doctor or health care professional if you are unable to keep an appointment. What may  interact with this medicine?  amphotericin B  azathioprine  certain antivirals for HIV or hepatitis  certain medicines for blood pressure, heart disease, irregular heart beat  certain medicines that treat or prevent blood clots like warfarin  certain other medicines for cancer  cyclosporine  etanercept  indomethacin  medicines that relax muscles for surgery  medicines to increase blood counts  metronidazole This list may not describe all possible interactions. Give your health care provider a list of all the medicines, herbs, non-prescription drugs, or dietary supplements you use. Also tell them if you smoke, drink alcohol, or use illegal drugs. Some items may interact with  your medicine. What should I watch for while using this medicine? Your condition will be monitored carefully while you are receiving this medicine. You may need blood work done while you are taking this medicine. Drink water or other fluids as directed. Urinate often, even at night. Some products may contain alcohol. Ask your health care professional if this medicine contains alcohol. Be sure to tell all health care professionals you are taking this medicine. Certain medicines, like metronidazole and disulfiram, can cause an unpleasant reaction when taken with alcohol. The reaction includes flushing, headache, nausea, vomiting, sweating, and increased thirst. The reaction can last from 30 minutes to several hours. Do not become pregnant while taking this medicine or for 1 year after stopping it. Women should inform their health care professional if they wish to become pregnant or think they might be pregnant. Men should not father a child while taking this medicine and for 4 months after stopping it. There is potential for serious side effects to an unborn child. Talk to your health care professional for more information. Do not breast-feed an infant while taking this medicine or for 1 week after stopping it. This medicine has caused ovarian failure in some women. This medicine may make it more difficult to get pregnant. Talk to your health care professional if you are concerned about your fertility. This medicine has caused decreased sperm counts in some men. This may make it more difficult to father a child. Talk to your health care professional if you are concerned about your fertility. Call your health care professional for advice if you get a fever, chills, or sore throat, or other symptoms of a cold or flu. Do not treat yourself. This medicine decreases your body's ability to fight infections. Try to avoid being around people who are sick. Avoid taking medicines that contain aspirin, acetaminophen,  ibuprofen, naproxen, or ketoprofen unless instructed by your health care professional. These medicines may hide a fever. Talk to your health care professional about your risk of cancer. You may be more at risk for certain types of cancer if you take this medicine. If you are going to need surgery or other procedure, tell your health care professional that you are using this medicine. Be careful brushing or flossing your teeth or using a toothpick because you may get an infection or bleed more easily. If you have any dental work done, tell your dentist you are receiving this medicine. What side effects may I notice from receiving this medicine? Side effects that you should report to your doctor or health care professional as soon as possible:  allergic reactions like skin rash, itching or hives, swelling of the face, lips, or tongue  breathing problems  nausea, vomiting  signs and symptoms of bleeding such as bloody or black, tarry stools; red or dark brown urine; spitting up blood or brown  material that looks like coffee grounds; red spots on the skin; unusual bruising or bleeding from the eyes, gums, or nose  signs and symptoms of heart failure like fast, irregular heartbeat, sudden weight gain; swelling of the ankles, feet, hands  signs and symptoms of infection like fever; chills; cough; sore throat; pain or trouble passing urine  signs and symptoms of kidney injury like trouble passing urine or change in the amount of urine  signs and symptoms of liver injury like dark yellow or brown urine; general ill feeling or flu-like symptoms; light-colored stools; loss of appetite; nausea; right upper belly pain; unusually weak or tired; yellowing of the eyes or skin Side effects that usually do not require medical attention (report to your doctor or health care professional if they continue or are bothersome):  confusion  decreased hearing  diarrhea  facial flushing  hair  loss  headache  loss of appetite  missed menstrual periods  signs and symptoms of low red blood cells or anemia such as unusually weak or tired; feeling faint or lightheaded; falls  skin discoloration This list may not describe all possible side effects. Call your doctor for medical advice about side effects. You may report side effects to FDA at 1-800-FDA-1088. Where should I keep my medicine? This drug is given in a hospital or clinic and will not be stored at home. NOTE: This sheet is a summary. It may not cover all possible information. If you have questions about this medicine, talk to your doctor, pharmacist, or health care provider.  2021 Elsevier/Gold Standard (2019-06-16 09:53:29)

## 2020-12-07 ENCOUNTER — Ambulatory Visit: Payer: Medicare Other

## 2020-12-10 ENCOUNTER — Ambulatory Visit: Payer: Medicare Other

## 2020-12-10 ENCOUNTER — Ambulatory Visit: Payer: Medicare Other | Admitting: Family

## 2020-12-10 ENCOUNTER — Inpatient Hospital Stay: Payer: Medicare Other

## 2020-12-10 ENCOUNTER — Other Ambulatory Visit: Payer: Medicare Other

## 2020-12-10 ENCOUNTER — Other Ambulatory Visit: Payer: Self-pay

## 2020-12-10 ENCOUNTER — Telehealth: Payer: Self-pay

## 2020-12-10 VITALS — BP 118/78 | HR 69 | Temp 98.2°F | Resp 16

## 2020-12-10 DIAGNOSIS — C9000 Multiple myeloma not having achieved remission: Secondary | ICD-10-CM | POA: Diagnosis not present

## 2020-12-10 DIAGNOSIS — Z5112 Encounter for antineoplastic immunotherapy: Secondary | ICD-10-CM | POA: Diagnosis not present

## 2020-12-10 DIAGNOSIS — Z5111 Encounter for antineoplastic chemotherapy: Secondary | ICD-10-CM | POA: Diagnosis not present

## 2020-12-10 DIAGNOSIS — Z79899 Other long term (current) drug therapy: Secondary | ICD-10-CM | POA: Diagnosis not present

## 2020-12-10 LAB — CBC WITH DIFFERENTIAL/PLATELET
Abs Immature Granulocytes: 0.02 10*3/uL (ref 0.00–0.07)
Basophils Absolute: 0 10*3/uL (ref 0.0–0.1)
Basophils Relative: 0 %
Eosinophils Absolute: 0 10*3/uL (ref 0.0–0.5)
Eosinophils Relative: 0 %
HCT: 30.5 % — ABNORMAL LOW (ref 39.0–52.0)
Hemoglobin: 10.1 g/dL — ABNORMAL LOW (ref 13.0–17.0)
Immature Granulocytes: 0 %
Lymphocytes Relative: 7 %
Lymphs Abs: 0.3 10*3/uL — ABNORMAL LOW (ref 0.7–4.0)
MCH: 29.8 pg (ref 26.0–34.0)
MCHC: 33.1 g/dL (ref 30.0–36.0)
MCV: 90 fL (ref 80.0–100.0)
Monocytes Absolute: 0.9 10*3/uL (ref 0.1–1.0)
Monocytes Relative: 21 %
Neutro Abs: 3.2 10*3/uL (ref 1.7–7.7)
Neutrophils Relative %: 72 %
Platelets: 99 10*3/uL — ABNORMAL LOW (ref 150–400)
RBC: 3.39 MIL/uL — ABNORMAL LOW (ref 4.22–5.81)
RDW: 18.9 % — ABNORMAL HIGH (ref 11.5–15.5)
WBC: 4.5 10*3/uL (ref 4.0–10.5)
nRBC: 0 % (ref 0.0–0.2)

## 2020-12-10 LAB — COMPREHENSIVE METABOLIC PANEL
ALT: 24 U/L (ref 0–44)
AST: 26 U/L (ref 15–41)
Albumin: 3.6 g/dL (ref 3.5–5.0)
Alkaline Phosphatase: 59 U/L (ref 38–126)
Anion gap: 7 (ref 5–15)
BUN: 30 mg/dL — ABNORMAL HIGH (ref 8–23)
CO2: 26 mmol/L (ref 22–32)
Calcium: 8.6 mg/dL — ABNORMAL LOW (ref 8.9–10.3)
Chloride: 107 mmol/L (ref 98–111)
Creatinine, Ser: 1.85 mg/dL — ABNORMAL HIGH (ref 0.61–1.24)
GFR, Estimated: 39 mL/min — ABNORMAL LOW (ref >60)
Glucose, Bld: 135 mg/dL — ABNORMAL HIGH (ref 70–99)
Potassium: 3.7 mmol/L (ref 3.5–5.1)
Sodium: 140 mmol/L (ref 135–145)
Total Bilirubin: 0.7 mg/dL (ref 0.3–1.2)
Total Protein: 5.1 g/dL — ABNORMAL LOW (ref 6.5–8.1)

## 2020-12-10 MED ORDER — SODIUM CHLORIDE 0.9 % IV SOLN
400.0000 mg/m2 | Freq: Once | INTRAVENOUS | Status: AC
  Administered 2020-12-10: 900 mg via INTRAVENOUS
  Filled 2020-12-10: qty 45

## 2020-12-10 MED ORDER — PALONOSETRON HCL INJECTION 0.25 MG/5ML
0.2500 mg | Freq: Once | INTRAVENOUS | Status: AC
  Administered 2020-12-10: 0.25 mg via INTRAVENOUS

## 2020-12-10 MED ORDER — SODIUM CHLORIDE 0.9 % IV SOLN
Freq: Once | INTRAVENOUS | Status: AC
  Filled 2020-12-10: qty 250

## 2020-12-10 MED ORDER — BORTEZOMIB CHEMO SQ INJECTION 3.5 MG (2.5MG/ML)
1.3000 mg/m2 | Freq: Once | INTRAMUSCULAR | Status: AC
  Administered 2020-12-10: 3 mg via SUBCUTANEOUS
  Filled 2020-12-10: qty 1.2

## 2020-12-10 MED ORDER — PALONOSETRON HCL INJECTION 0.25 MG/5ML
INTRAVENOUS | Status: AC
  Filled 2020-12-10: qty 5

## 2020-12-10 MED ORDER — SODIUM CHLORIDE 0.9 % IV SOLN
20.0000 mg | Freq: Once | INTRAVENOUS | Status: AC
  Administered 2020-12-10: 20 mg via INTRAVENOUS
  Filled 2020-12-10: qty 20

## 2020-12-10 NOTE — Progress Notes (Signed)
Ok to treat with platelets of 99 & creatinine 1.85 per Judson Roch, NP. dph

## 2020-12-10 NOTE — Telephone Encounter (Signed)
The called to let me know he is sending his transmission he missed. He asked me to call him on Monday if I do not receive it. I told him I will call him Monday if I do not receive the transmission.

## 2020-12-10 NOTE — Patient Instructions (Signed)
Bortezomib injection What is this medicine? BORTEZOMIB (bor TEZ oh mib) targets proteins in cancer cells and stops the cancer cells from growing. It treats multiple myeloma and mantle cell lymphoma. This medicine may be used for other purposes; ask your health care provider or pharmacist if you have questions. COMMON BRAND NAME(S): Velcade What should I tell my health care provider before I take this medicine? They need to know if you have any of these conditions:  dehydration  diabetes (high blood sugar)  heart disease  liver disease  tingling of the fingers or toes or other nerve disorder  an unusual or allergic reaction to bortezomib, mannitol, boron, other medicines, foods, dyes, or preservatives  pregnant or trying to get pregnant  breast-feeding How should I use this medicine? This medicine is injected into a vein or under the skin. It is given by a health care provider in a hospital or clinic setting. Talk to your health care provider about the use of this medicine in children. Special care may be needed. Overdosage: If you think you have taken too much of this medicine contact a poison control center or emergency room at once. NOTE: This medicine is only for you. Do not share this medicine with others. What if I miss a dose? Keep appointments for follow-up doses. It is important not to miss your dose. Call your health care provider if you are unable to keep an appointment. What may interact with this medicine? This medicine may interact with the following medications:  ketoconazole  rifampin This list may not describe all possible interactions. Give your health care provider a list of all the medicines, herbs, non-prescription drugs, or dietary supplements you use. Also tell them if you smoke, drink alcohol, or use illegal drugs. Some items may interact with your medicine. What should I watch for while using this medicine? Your condition will be monitored carefully while  you are receiving this medicine. You may need blood work done while you are taking this medicine. You may get drowsy or dizzy. Do not drive, use machinery, or do anything that needs mental alertness until you know how this medicine affects you. Do not stand up or sit up quickly, especially if you are an older patient. This reduces the risk of dizzy or fainting spells This medicine may increase your risk of getting an infection. Call your health care provider for advice if you get a fever, chills, sore throat, or other symptoms of a cold or flu. Do not treat yourself. Try to avoid being around people who are sick. Check with your health care provider if you have severe diarrhea, nausea, and vomiting, or if you sweat a lot. The loss of too much body fluid may make it dangerous for you to take this medicine. Do not become pregnant while taking this medicine or for 7 months after stopping it. Women should inform their health care provider if they wish to become pregnant or think they might be pregnant. Men should not father a child while taking this medicine and for 4 months after stopping it. There is a potential for serious harm to an unborn child. Talk to your health care provider for more information. Do not breast-feed an infant while taking this medicine or for 2 months after stopping it. This medicine may make it more difficult to get pregnant or father a child. Talk to your health care provider if you are concerned about your fertility. What side effects may I notice from receiving this medicine?   Side effects that you should report to your doctor or health care professional as soon as possible:  allergic reactions (skin rash; itching or hives; swelling of the face, lips, or tongue)  bleeding (bloody or black, tarry stools; red or dark brown urine; spitting up blood or brown material that looks like coffee grounds; red spots on the skin; unusual bruising or bleeding from the eye, gums, or  nose)  blurred vision or changes in vision  confusion  constipation  headache  heart failure (trouble breathing; fast, irregular heartbeat; sudden weight gain; swelling of the ankles, feet, hands)  infection (fever, chills, cough, sore throat, pain or trouble passing urine)  lack or loss of appetite  liver injury (dark yellow or brown urine; general ill feeling or flu-like symptoms; loss of appetite, right upper belly pain; yellowing of the eyes or skin)  low blood pressure (dizziness; feeling faint or lightheaded, falls; unusually weak or tired)  muscle cramps  pain, redness, or irritation at site where injected  pain, tingling, numbness in the hands or feet  seizures  trouble breathing  unusual bruising or bleeding Side effects that usually do not require medical attention (report to your doctor or health care professional if they continue or are bothersome):  diarrhea  nausea  stomach pain  trouble sleeping  vomiting This list may not describe all possible side effects. Call your doctor for medical advice about side effects. You may report side effects to FDA at 1-800-FDA-1088. Where should I keep my medicine? This medicine is given in a hospital or clinic. It will not be stored at home. NOTE: This sheet is a summary. It may not cover all possible information. If you have questions about this medicine, talk to your doctor, pharmacist, or health care provider.  2021 Elsevier/Gold Standard (2020-09-02 13:22:53) Cyclophosphamide Injection What is this medicine? CYCLOPHOSPHAMIDE (sye kloe FOSS fa mide) is a chemotherapy drug. It slows the growth of cancer cells. This medicine is used to treat many types of cancer like lymphoma, myeloma, leukemia, breast cancer, and ovarian cancer, to name a few. This medicine may be used for other purposes; ask your health care provider or pharmacist if you have questions. COMMON BRAND NAME(S): Cytoxan, Neosar What should I tell my  health care provider before I take this medicine? They need to know if you have any of these conditions:  heart disease  history of irregular heartbeat  infection  kidney disease  liver disease  low blood counts, like white cells, platelets, or red blood cells  on hemodialysis  recent or ongoing radiation therapy  scarring or thickening of the lungs  trouble passing urine  an unusual or allergic reaction to cyclophosphamide, other medicines, foods, dyes, or preservatives  pregnant or trying to get pregnant  breast-feeding How should I use this medicine? This drug is usually given as an injection into a vein or muscle or by infusion into a vein. It is administered in a hospital or clinic by a specially trained health care professional. Talk to your pediatrician regarding the use of this medicine in children. Special care may be needed. Overdosage: If you think you have taken too much of this medicine contact a poison control center or emergency room at once. NOTE: This medicine is only for you. Do not share this medicine with others. What if I miss a dose? It is important not to miss your dose. Call your doctor or health care professional if you are unable to keep an appointment. What may  interact with this medicine?  amphotericin B  azathioprine  certain antivirals for HIV or hepatitis  certain medicines for blood pressure, heart disease, irregular heart beat  certain medicines that treat or prevent blood clots like warfarin  certain other medicines for cancer  cyclosporine  etanercept  indomethacin  medicines that relax muscles for surgery  medicines to increase blood counts  metronidazole This list may not describe all possible interactions. Give your health care provider a list of all the medicines, herbs, non-prescription drugs, or dietary supplements you use. Also tell them if you smoke, drink alcohol, or use illegal drugs. Some items may interact with  your medicine. What should I watch for while using this medicine? Your condition will be monitored carefully while you are receiving this medicine. You may need blood work done while you are taking this medicine. Drink water or other fluids as directed. Urinate often, even at night. Some products may contain alcohol. Ask your health care professional if this medicine contains alcohol. Be sure to tell all health care professionals you are taking this medicine. Certain medicines, like metronidazole and disulfiram, can cause an unpleasant reaction when taken with alcohol. The reaction includes flushing, headache, nausea, vomiting, sweating, and increased thirst. The reaction can last from 30 minutes to several hours. Do not become pregnant while taking this medicine or for 1 year after stopping it. Women should inform their health care professional if they wish to become pregnant or think they might be pregnant. Men should not father a child while taking this medicine and for 4 months after stopping it. There is potential for serious side effects to an unborn child. Talk to your health care professional for more information. Do not breast-feed an infant while taking this medicine or for 1 week after stopping it. This medicine has caused ovarian failure in some women. This medicine may make it more difficult to get pregnant. Talk to your health care professional if you are concerned about your fertility. This medicine has caused decreased sperm counts in some men. This may make it more difficult to father a child. Talk to your health care professional if you are concerned about your fertility. Call your health care professional for advice if you get a fever, chills, or sore throat, or other symptoms of a cold or flu. Do not treat yourself. This medicine decreases your body's ability to fight infections. Try to avoid being around people who are sick. Avoid taking medicines that contain aspirin, acetaminophen,  ibuprofen, naproxen, or ketoprofen unless instructed by your health care professional. These medicines may hide a fever. Talk to your health care professional about your risk of cancer. You may be more at risk for certain types of cancer if you take this medicine. If you are going to need surgery or other procedure, tell your health care professional that you are using this medicine. Be careful brushing or flossing your teeth or using a toothpick because you may get an infection or bleed more easily. If you have any dental work done, tell your dentist you are receiving this medicine. What side effects may I notice from receiving this medicine? Side effects that you should report to your doctor or health care professional as soon as possible:  allergic reactions like skin rash, itching or hives, swelling of the face, lips, or tongue  breathing problems  nausea, vomiting  signs and symptoms of bleeding such as bloody or black, tarry stools; red or dark brown urine; spitting up blood or brown  material that looks like coffee grounds; red spots on the skin; unusual bruising or bleeding from the eyes, gums, or nose  signs and symptoms of heart failure like fast, irregular heartbeat, sudden weight gain; swelling of the ankles, feet, hands  signs and symptoms of infection like fever; chills; cough; sore throat; pain or trouble passing urine  signs and symptoms of kidney injury like trouble passing urine or change in the amount of urine  signs and symptoms of liver injury like dark yellow or brown urine; general ill feeling or flu-like symptoms; light-colored stools; loss of appetite; nausea; right upper belly pain; unusually weak or tired; yellowing of the eyes or skin Side effects that usually do not require medical attention (report to your doctor or health care professional if they continue or are bothersome):  confusion  decreased hearing  diarrhea  facial flushing  hair  loss  headache  loss of appetite  missed menstrual periods  signs and symptoms of low red blood cells or anemia such as unusually weak or tired; feeling faint or lightheaded; falls  skin discoloration This list may not describe all possible side effects. Call your doctor for medical advice about side effects. You may report side effects to FDA at 1-800-FDA-1088. Where should I keep my medicine? This drug is given in a hospital or clinic and will not be stored at home. NOTE: This sheet is a summary. It may not cover all possible information. If you have questions about this medicine, talk to your doctor, pharmacist, or health care provider.  2021 Elsevier/Gold Standard (2019-06-16 09:53:29)

## 2020-12-13 LAB — CUP PACEART REMOTE DEVICE CHECK
Battery Remaining Longevity: 32 mo
Battery Remaining Percentage: 39 %
Battery Voltage: 2.92 V
Date Time Interrogation Session: 20220318170353
HighPow Impedance: 54 Ohm
HighPow Impedance: 54 Ohm
Implantable Lead Implant Date: 20170525
Implantable Lead Implant Date: 20170525
Implantable Lead Implant Date: 20170525
Implantable Lead Location: 753858
Implantable Lead Location: 753859
Implantable Lead Location: 753860
Implantable Lead Model: 7122
Implantable Pulse Generator Implant Date: 20170525
Lead Channel Impedance Value: 380 Ohm
Lead Channel Impedance Value: 400 Ohm
Lead Channel Impedance Value: 600 Ohm
Lead Channel Pacing Threshold Amplitude: 0.5 V
Lead Channel Pacing Threshold Amplitude: 1.5 V
Lead Channel Pacing Threshold Pulse Width: 0.5 ms
Lead Channel Pacing Threshold Pulse Width: 0.5 ms
Lead Channel Sensing Intrinsic Amplitude: 0.3 mV
Lead Channel Sensing Intrinsic Amplitude: 11.4 mV
Lead Channel Setting Pacing Amplitude: 2 V
Lead Channel Setting Pacing Amplitude: 2.5 V
Lead Channel Setting Pacing Pulse Width: 0.5 ms
Lead Channel Setting Pacing Pulse Width: 0.5 ms
Lead Channel Setting Sensing Sensitivity: 0.5 mV
Pulse Gen Serial Number: 7357926

## 2020-12-14 ENCOUNTER — Ambulatory Visit (INDEPENDENT_AMBULATORY_CARE_PROVIDER_SITE_OTHER): Payer: Medicare Other

## 2020-12-14 DIAGNOSIS — I469 Cardiac arrest, cause unspecified: Secondary | ICD-10-CM

## 2020-12-14 NOTE — Telephone Encounter (Signed)
Transmission received.

## 2020-12-17 ENCOUNTER — Inpatient Hospital Stay: Payer: Medicare Other

## 2020-12-17 ENCOUNTER — Other Ambulatory Visit: Payer: Self-pay

## 2020-12-17 ENCOUNTER — Other Ambulatory Visit: Payer: Medicare Other

## 2020-12-17 VITALS — BP 126/86 | HR 90 | Temp 98.1°F | Resp 16

## 2020-12-17 DIAGNOSIS — C9000 Multiple myeloma not having achieved remission: Secondary | ICD-10-CM

## 2020-12-17 DIAGNOSIS — Z5111 Encounter for antineoplastic chemotherapy: Secondary | ICD-10-CM | POA: Diagnosis not present

## 2020-12-17 DIAGNOSIS — Z79899 Other long term (current) drug therapy: Secondary | ICD-10-CM | POA: Diagnosis not present

## 2020-12-17 DIAGNOSIS — Z5112 Encounter for antineoplastic immunotherapy: Secondary | ICD-10-CM | POA: Diagnosis not present

## 2020-12-17 LAB — CBC WITH DIFFERENTIAL/PLATELET
Abs Immature Granulocytes: 0.03 10*3/uL (ref 0.00–0.07)
Basophils Absolute: 0 10*3/uL (ref 0.0–0.1)
Basophils Relative: 0 %
Eosinophils Absolute: 0 10*3/uL (ref 0.0–0.5)
Eosinophils Relative: 1 %
HCT: 30.5 % — ABNORMAL LOW (ref 39.0–52.0)
Hemoglobin: 10 g/dL — ABNORMAL LOW (ref 13.0–17.0)
Immature Granulocytes: 1 %
Lymphocytes Relative: 6 %
Lymphs Abs: 0.3 10*3/uL — ABNORMAL LOW (ref 0.7–4.0)
MCH: 29.4 pg (ref 26.0–34.0)
MCHC: 32.8 g/dL (ref 30.0–36.0)
MCV: 89.7 fL (ref 80.0–100.0)
Monocytes Absolute: 0.9 10*3/uL (ref 0.1–1.0)
Monocytes Relative: 17 %
Neutro Abs: 4.2 10*3/uL (ref 1.7–7.7)
Neutrophils Relative %: 75 %
Platelets: 78 10*3/uL — ABNORMAL LOW (ref 150–400)
RBC: 3.4 MIL/uL — ABNORMAL LOW (ref 4.22–5.81)
RDW: 19.1 % — ABNORMAL HIGH (ref 11.5–15.5)
WBC: 5.5 10*3/uL (ref 4.0–10.5)
nRBC: 0 % (ref 0.0–0.2)

## 2020-12-17 LAB — COMPREHENSIVE METABOLIC PANEL
ALT: 22 U/L (ref 0–44)
AST: 20 U/L (ref 15–41)
Albumin: 3.4 g/dL — ABNORMAL LOW (ref 3.5–5.0)
Alkaline Phosphatase: 62 U/L (ref 38–126)
Anion gap: 9 (ref 5–15)
BUN: 33 mg/dL — ABNORMAL HIGH (ref 8–23)
CO2: 25 mmol/L (ref 22–32)
Calcium: 8.3 mg/dL — ABNORMAL LOW (ref 8.9–10.3)
Chloride: 108 mmol/L (ref 98–111)
Creatinine, Ser: 1.69 mg/dL — ABNORMAL HIGH (ref 0.61–1.24)
GFR, Estimated: 43 mL/min — ABNORMAL LOW (ref >60)
Glucose, Bld: 171 mg/dL — ABNORMAL HIGH (ref 70–99)
Potassium: 3.5 mmol/L (ref 3.5–5.1)
Sodium: 142 mmol/L (ref 135–145)
Total Bilirubin: 0.7 mg/dL (ref 0.3–1.2)
Total Protein: 5 g/dL — ABNORMAL LOW (ref 6.5–8.1)

## 2020-12-17 MED ORDER — PALONOSETRON HCL INJECTION 0.25 MG/5ML
0.2500 mg | Freq: Once | INTRAVENOUS | Status: AC
  Administered 2020-12-17: 0.25 mg via INTRAVENOUS

## 2020-12-17 MED ORDER — SODIUM CHLORIDE 0.9 % IV SOLN
Freq: Once | INTRAVENOUS | Status: AC
Start: 2020-12-17 — End: 2020-12-17
  Filled 2020-12-17: qty 250

## 2020-12-17 MED ORDER — SODIUM CHLORIDE 0.9 % IV SOLN
400.0000 mg/m2 | Freq: Once | INTRAVENOUS | Status: AC
  Administered 2020-12-17: 900 mg via INTRAVENOUS
  Filled 2020-12-17: qty 45

## 2020-12-17 MED ORDER — BORTEZOMIB CHEMO SQ INJECTION 3.5 MG (2.5MG/ML)
1.3000 mg/m2 | Freq: Once | INTRAMUSCULAR | Status: AC
  Administered 2020-12-17: 3 mg via SUBCUTANEOUS
  Filled 2020-12-17: qty 1.2

## 2020-12-17 MED ORDER — SODIUM CHLORIDE 0.9 % IV SOLN
20.0000 mg | Freq: Once | INTRAVENOUS | Status: AC
  Administered 2020-12-17: 20 mg via INTRAVENOUS
  Filled 2020-12-17: qty 20

## 2020-12-17 MED ORDER — PALONOSETRON HCL INJECTION 0.25 MG/5ML
INTRAVENOUS | Status: AC
  Filled 2020-12-17: qty 5

## 2020-12-17 NOTE — Progress Notes (Signed)
Ok to treat with platelets of 78 & creatinine 1.69 per Dr Marin Olp. dph

## 2020-12-17 NOTE — Patient Instructions (Signed)
Bortezomib injection What is this medicine? BORTEZOMIB (bor TEZ oh mib) targets proteins in cancer cells and stops the cancer cells from growing. It treats multiple myeloma and mantle cell lymphoma. This medicine may be used for other purposes; ask your health care provider or pharmacist if you have questions. COMMON BRAND NAME(S): Velcade What should I tell my health care provider before I take this medicine? They need to know if you have any of these conditions:  dehydration  diabetes (high blood sugar)  heart disease  liver disease  tingling of the fingers or toes or other nerve disorder  an unusual or allergic reaction to bortezomib, mannitol, boron, other medicines, foods, dyes, or preservatives  pregnant or trying to get pregnant  breast-feeding How should I use this medicine? This medicine is injected into a vein or under the skin. It is given by a health care provider in a hospital or clinic setting. Talk to your health care provider about the use of this medicine in children. Special care may be needed. Overdosage: If you think you have taken too much of this medicine contact a poison control center or emergency room at once. NOTE: This medicine is only for you. Do not share this medicine with others. What if I miss a dose? Keep appointments for follow-up doses. It is important not to miss your dose. Call your health care provider if you are unable to keep an appointment. What may interact with this medicine? This medicine may interact with the following medications:  ketoconazole  rifampin This list may not describe all possible interactions. Give your health care provider a list of all the medicines, herbs, non-prescription drugs, or dietary supplements you use. Also tell them if you smoke, drink alcohol, or use illegal drugs. Some items may interact with your medicine. What should I watch for while using this medicine? Your condition will be monitored carefully while  you are receiving this medicine. You may need blood work done while you are taking this medicine. You may get drowsy or dizzy. Do not drive, use machinery, or do anything that needs mental alertness until you know how this medicine affects you. Do not stand up or sit up quickly, especially if you are an older patient. This reduces the risk of dizzy or fainting spells This medicine may increase your risk of getting an infection. Call your health care provider for advice if you get a fever, chills, sore throat, or other symptoms of a cold or flu. Do not treat yourself. Try to avoid being around people who are sick. Check with your health care provider if you have severe diarrhea, nausea, and vomiting, or if you sweat a lot. The loss of too much body fluid may make it dangerous for you to take this medicine. Do not become pregnant while taking this medicine or for 7 months after stopping it. Women should inform their health care provider if they wish to become pregnant or think they might be pregnant. Men should not father a child while taking this medicine and for 4 months after stopping it. There is a potential for serious harm to an unborn child. Talk to your health care provider for more information. Do not breast-feed an infant while taking this medicine or for 2 months after stopping it. This medicine may make it more difficult to get pregnant or father a child. Talk to your health care provider if you are concerned about your fertility. What side effects may I notice from receiving this medicine?   Side effects that you should report to your doctor or health care professional as soon as possible:  allergic reactions (skin rash; itching or hives; swelling of the face, lips, or tongue)  bleeding (bloody or black, tarry stools; red or dark brown urine; spitting up blood or brown material that looks like coffee grounds; red spots on the skin; unusual bruising or bleeding from the eye, gums, or  nose)  blurred vision or changes in vision  confusion  constipation  headache  heart failure (trouble breathing; fast, irregular heartbeat; sudden weight gain; swelling of the ankles, feet, hands)  infection (fever, chills, cough, sore throat, pain or trouble passing urine)  lack or loss of appetite  liver injury (dark yellow or brown urine; general ill feeling or flu-like symptoms; loss of appetite, right upper belly pain; yellowing of the eyes or skin)  low blood pressure (dizziness; feeling faint or lightheaded, falls; unusually weak or tired)  muscle cramps  pain, redness, or irritation at site where injected  pain, tingling, numbness in the hands or feet  seizures  trouble breathing  unusual bruising or bleeding Side effects that usually do not require medical attention (report to your doctor or health care professional if they continue or are bothersome):  diarrhea  nausea  stomach pain  trouble sleeping  vomiting This list may not describe all possible side effects. Call your doctor for medical advice about side effects. You may report side effects to FDA at 1-800-FDA-1088. Where should I keep my medicine? This medicine is given in a hospital or clinic. It will not be stored at home. NOTE: This sheet is a summary. It may not cover all possible information. If you have questions about this medicine, talk to your doctor, pharmacist, or health care provider.  2021 Elsevier/Gold Standard (2020-09-02 13:22:53) Cyclophosphamide Injection What is this medicine? CYCLOPHOSPHAMIDE (sye kloe FOSS fa mide) is a chemotherapy drug. It slows the growth of cancer cells. This medicine is used to treat many types of cancer like lymphoma, myeloma, leukemia, breast cancer, and ovarian cancer, to name a few. This medicine may be used for other purposes; ask your health care provider or pharmacist if you have questions. COMMON BRAND NAME(S): Cytoxan, Neosar What should I tell my  health care provider before I take this medicine? They need to know if you have any of these conditions:  heart disease  history of irregular heartbeat  infection  kidney disease  liver disease  low blood counts, like white cells, platelets, or red blood cells  on hemodialysis  recent or ongoing radiation therapy  scarring or thickening of the lungs  trouble passing urine  an unusual or allergic reaction to cyclophosphamide, other medicines, foods, dyes, or preservatives  pregnant or trying to get pregnant  breast-feeding How should I use this medicine? This drug is usually given as an injection into a vein or muscle or by infusion into a vein. It is administered in a hospital or clinic by a specially trained health care professional. Talk to your pediatrician regarding the use of this medicine in children. Special care may be needed. Overdosage: If you think you have taken too much of this medicine contact a poison control center or emergency room at once. NOTE: This medicine is only for you. Do not share this medicine with others. What if I miss a dose? It is important not to miss your dose. Call your doctor or health care professional if you are unable to keep an appointment. What may  interact with this medicine?  amphotericin B  azathioprine  certain antivirals for HIV or hepatitis  certain medicines for blood pressure, heart disease, irregular heart beat  certain medicines that treat or prevent blood clots like warfarin  certain other medicines for cancer  cyclosporine  etanercept  indomethacin  medicines that relax muscles for surgery  medicines to increase blood counts  metronidazole This list may not describe all possible interactions. Give your health care provider a list of all the medicines, herbs, non-prescription drugs, or dietary supplements you use. Also tell them if you smoke, drink alcohol, or use illegal drugs. Some items may interact with  your medicine. What should I watch for while using this medicine? Your condition will be monitored carefully while you are receiving this medicine. You may need blood work done while you are taking this medicine. Drink water or other fluids as directed. Urinate often, even at night. Some products may contain alcohol. Ask your health care professional if this medicine contains alcohol. Be sure to tell all health care professionals you are taking this medicine. Certain medicines, like metronidazole and disulfiram, can cause an unpleasant reaction when taken with alcohol. The reaction includes flushing, headache, nausea, vomiting, sweating, and increased thirst. The reaction can last from 30 minutes to several hours. Do not become pregnant while taking this medicine or for 1 year after stopping it. Women should inform their health care professional if they wish to become pregnant or think they might be pregnant. Men should not father a child while taking this medicine and for 4 months after stopping it. There is potential for serious side effects to an unborn child. Talk to your health care professional for more information. Do not breast-feed an infant while taking this medicine or for 1 week after stopping it. This medicine has caused ovarian failure in some women. This medicine may make it more difficult to get pregnant. Talk to your health care professional if you are concerned about your fertility. This medicine has caused decreased sperm counts in some men. This may make it more difficult to father a child. Talk to your health care professional if you are concerned about your fertility. Call your health care professional for advice if you get a fever, chills, or sore throat, or other symptoms of a cold or flu. Do not treat yourself. This medicine decreases your body's ability to fight infections. Try to avoid being around people who are sick. Avoid taking medicines that contain aspirin, acetaminophen,  ibuprofen, naproxen, or ketoprofen unless instructed by your health care professional. These medicines may hide a fever. Talk to your health care professional about your risk of cancer. You may be more at risk for certain types of cancer if you take this medicine. If you are going to need surgery or other procedure, tell your health care professional that you are using this medicine. Be careful brushing or flossing your teeth or using a toothpick because you may get an infection or bleed more easily. If you have any dental work done, tell your dentist you are receiving this medicine. What side effects may I notice from receiving this medicine? Side effects that you should report to your doctor or health care professional as soon as possible:  allergic reactions like skin rash, itching or hives, swelling of the face, lips, or tongue  breathing problems  nausea, vomiting  signs and symptoms of bleeding such as bloody or black, tarry stools; red or dark brown urine; spitting up blood or brown  material that looks like coffee grounds; red spots on the skin; unusual bruising or bleeding from the eyes, gums, or nose  signs and symptoms of heart failure like fast, irregular heartbeat, sudden weight gain; swelling of the ankles, feet, hands  signs and symptoms of infection like fever; chills; cough; sore throat; pain or trouble passing urine  signs and symptoms of kidney injury like trouble passing urine or change in the amount of urine  signs and symptoms of liver injury like dark yellow or brown urine; general ill feeling or flu-like symptoms; light-colored stools; loss of appetite; nausea; right upper belly pain; unusually weak or tired; yellowing of the eyes or skin Side effects that usually do not require medical attention (report to your doctor or health care professional if they continue or are bothersome):  confusion  decreased hearing  diarrhea  facial flushing  hair  loss  headache  loss of appetite  missed menstrual periods  signs and symptoms of low red blood cells or anemia such as unusually weak or tired; feeling faint or lightheaded; falls  skin discoloration This list may not describe all possible side effects. Call your doctor for medical advice about side effects. You may report side effects to FDA at 1-800-FDA-1088. Where should I keep my medicine? This drug is given in a hospital or clinic and will not be stored at home. NOTE: This sheet is a summary. It may not cover all possible information. If you have questions about this medicine, talk to your doctor, pharmacist, or health care provider.  2021 Elsevier/Gold Standard (2019-06-16 09:53:29)

## 2020-12-20 DIAGNOSIS — C9001 Multiple myeloma in remission: Secondary | ICD-10-CM | POA: Diagnosis not present

## 2020-12-22 ENCOUNTER — Encounter: Payer: Self-pay | Admitting: *Deleted

## 2020-12-22 NOTE — Progress Notes (Signed)
Patient was seen at [facility] for possible transplant. Due to his heart history he is a high risk candidate. Patient has been educated and it's now his decision on whether or not to proceed. He will notify Dr Hayden Pedro office of his decision.  Oncology Nurse Navigator Documentation  Oncology Nurse Navigator Flowsheets 12/22/2020  Abnormal Finding Date -  Confirmed Diagnosis Date -  Diagnosis Status -  Planned Course of Treatment -  Phase of Treatment -  Chemotherapy Pending- Reason: -  Chemotherapy Actual Start Date: -  Navigator Follow Up Date: 12/31/2020  Navigator Follow Up Reason: Follow-up Appointment;Chemotherapy  Navigator Location [facility]  Referral Date to RadOnc/MedOnc -  Navigator Encounter Type Appt/Treatment Plan Review  Telephone -  Treatment Initiated Date -  Patient Visit Type MedOnc  Treatment Phase Active Tx  Barriers/Navigation Needs Coordination of Care;Education;Employed  Education -  Interventions None Required  Acuity Level 2-Minimal Needs (1-2 Barriers Identified)  Referrals -  Coordination of Care -  Education Method -  Support Groups/Services Friends and Family  Time Spent with Patient 30

## 2020-12-23 NOTE — Progress Notes (Signed)
Remote ICD transmission.   

## 2020-12-27 ENCOUNTER — Telehealth: Payer: Self-pay

## 2020-12-27 NOTE — Telephone Encounter (Signed)
I left the pt a detailed message that the office needed him to come for his appointment tomorrow at noon and not 3 pm and for him to call the office back.

## 2020-12-28 ENCOUNTER — Encounter: Payer: Self-pay | Admitting: Podiatry

## 2020-12-28 ENCOUNTER — Ambulatory Visit: Payer: Medicare Other | Admitting: Internal Medicine

## 2020-12-28 ENCOUNTER — Ambulatory Visit (INDEPENDENT_AMBULATORY_CARE_PROVIDER_SITE_OTHER): Payer: Medicare Other | Admitting: Podiatry

## 2020-12-28 ENCOUNTER — Other Ambulatory Visit: Payer: Self-pay

## 2020-12-28 DIAGNOSIS — E119 Type 2 diabetes mellitus without complications: Secondary | ICD-10-CM | POA: Diagnosis not present

## 2020-12-28 DIAGNOSIS — M79674 Pain in right toe(s): Secondary | ICD-10-CM | POA: Diagnosis not present

## 2020-12-28 DIAGNOSIS — M79675 Pain in left toe(s): Secondary | ICD-10-CM | POA: Diagnosis not present

## 2020-12-28 DIAGNOSIS — D689 Coagulation defect, unspecified: Secondary | ICD-10-CM

## 2020-12-28 DIAGNOSIS — B351 Tinea unguium: Secondary | ICD-10-CM | POA: Diagnosis not present

## 2020-12-28 DIAGNOSIS — L608 Other nail disorders: Secondary | ICD-10-CM

## 2020-12-28 NOTE — Progress Notes (Signed)
This patient returns to my office for at risk foot care.  This patient requires this care by a professional since this patient will be at risk due to having diabetes type 2, chronic kidney disease and coagulation defect.  Patient is taking eliquisThis patient is unable to cut nails himself since the patient cannot reach his nails.These nails are painful walking and wearing shoes.  This patient presents for at risk foot care today.  General Appearance  Alert, conversant and in no acute stress.  Vascular  Dorsalis pedis and posterior tibial  pulses are palpable  bilaterally.  Capillary return is within normal limits  bilaterally. Temperature is within normal limits  bilaterally.  Neurologic  Senn-Weinstein monofilament wire test within normal limits  bilaterally. Muscle power within normal limits bilaterally.  Nails Thick disfigured discolored nails with subungual debris  from hallux to fifth toes bilaterally. No evidence of bacterial infection or drainage bilaterally.  Orthopedic  No limitations of motion  feet .  No crepitus or effusions noted.  No bony pathology or digital deformities noted.  Skin  normotropic skin with no porokeratosis noted bilaterally.  No signs of infections or ulcers noted.     Onychomycosis  Pain in right toes  Pain in left toes  Consent was obtained for treatment procedures.   Mechanical debridement of nails 1-5  bilaterally performed with a nail nipper.  Filed with dremel without incident.    Return office visit   10 weeks                   Told patient to return for periodic foot care and evaluation due to potential at risk complications.   Lourdes Kucharski DPM  

## 2020-12-31 ENCOUNTER — Inpatient Hospital Stay: Payer: Medicare Other

## 2020-12-31 ENCOUNTER — Encounter: Payer: Self-pay | Admitting: *Deleted

## 2020-12-31 ENCOUNTER — Inpatient Hospital Stay (HOSPITAL_BASED_OUTPATIENT_CLINIC_OR_DEPARTMENT_OTHER): Payer: Medicare Other | Admitting: Hematology & Oncology

## 2020-12-31 ENCOUNTER — Telehealth: Payer: Self-pay

## 2020-12-31 ENCOUNTER — Encounter: Payer: Self-pay | Admitting: Hematology & Oncology

## 2020-12-31 ENCOUNTER — Other Ambulatory Visit: Payer: Self-pay

## 2020-12-31 ENCOUNTER — Inpatient Hospital Stay: Payer: Medicare Other | Attending: Family

## 2020-12-31 VITALS — BP 90/57 | HR 70 | Temp 98.4°F | Resp 17 | Wt 227.8 lb

## 2020-12-31 DIAGNOSIS — C9001 Multiple myeloma in remission: Secondary | ICD-10-CM

## 2020-12-31 DIAGNOSIS — C9000 Multiple myeloma not having achieved remission: Secondary | ICD-10-CM

## 2020-12-31 DIAGNOSIS — Z7901 Long term (current) use of anticoagulants: Secondary | ICD-10-CM | POA: Insufficient documentation

## 2020-12-31 DIAGNOSIS — Z79899 Other long term (current) drug therapy: Secondary | ICD-10-CM | POA: Diagnosis not present

## 2020-12-31 LAB — CMP (CANCER CENTER ONLY)
ALT: 20 U/L (ref 0–44)
AST: 24 U/L (ref 15–41)
Albumin: 3.7 g/dL (ref 3.5–5.0)
Alkaline Phosphatase: 61 U/L (ref 38–126)
Anion gap: 9 (ref 5–15)
BUN: 25 mg/dL — ABNORMAL HIGH (ref 8–23)
CO2: 26 mmol/L (ref 22–32)
Calcium: 8.8 mg/dL — ABNORMAL LOW (ref 8.9–10.3)
Chloride: 107 mmol/L (ref 98–111)
Creatinine: 1.72 mg/dL — ABNORMAL HIGH (ref 0.61–1.24)
GFR, Estimated: 42 mL/min — ABNORMAL LOW (ref >60)
Glucose, Bld: 170 mg/dL — ABNORMAL HIGH (ref 70–99)
Potassium: 3.3 mmol/L — ABNORMAL LOW (ref 3.5–5.1)
Sodium: 142 mmol/L (ref 135–145)
Total Bilirubin: 0.9 mg/dL (ref 0.3–1.2)
Total Protein: 5.4 g/dL — ABNORMAL LOW (ref 6.5–8.1)

## 2020-12-31 LAB — CBC WITH DIFFERENTIAL (CANCER CENTER ONLY)
Abs Immature Granulocytes: 0.01 10*3/uL (ref 0.00–0.07)
Basophils Absolute: 0 10*3/uL (ref 0.0–0.1)
Basophils Relative: 1 %
Eosinophils Absolute: 0 10*3/uL (ref 0.0–0.5)
Eosinophils Relative: 1 %
HCT: 30.7 % — ABNORMAL LOW (ref 39.0–52.0)
Hemoglobin: 10.2 g/dL — ABNORMAL LOW (ref 13.0–17.0)
Immature Granulocytes: 0 %
Lymphocytes Relative: 13 %
Lymphs Abs: 0.4 10*3/uL — ABNORMAL LOW (ref 0.7–4.0)
MCH: 30.1 pg (ref 26.0–34.0)
MCHC: 33.2 g/dL (ref 30.0–36.0)
MCV: 90.6 fL (ref 80.0–100.0)
Monocytes Absolute: 0.8 10*3/uL (ref 0.1–1.0)
Monocytes Relative: 22 %
Neutro Abs: 2.3 10*3/uL (ref 1.7–7.7)
Neutrophils Relative %: 63 %
Platelet Count: 160 10*3/uL (ref 150–400)
RBC: 3.39 MIL/uL — ABNORMAL LOW (ref 4.22–5.81)
RDW: 19.4 % — ABNORMAL HIGH (ref 11.5–15.5)
WBC Count: 3.5 10*3/uL — ABNORMAL LOW (ref 4.0–10.5)
nRBC: 0 % (ref 0.0–0.2)

## 2020-12-31 MED ORDER — SODIUM CHLORIDE 0.9 % IV SOLN
20.0000 mg | Freq: Once | INTRAVENOUS | Status: DC
  Filled 2020-12-31: qty 2

## 2020-12-31 MED ORDER — BORTEZOMIB CHEMO SQ INJECTION 3.5 MG (2.5MG/ML)
1.3000 mg/m2 | Freq: Once | INTRAMUSCULAR | Status: DC
  Filled 2020-12-31: qty 1.2

## 2020-12-31 MED ORDER — SODIUM CHLORIDE 0.9 % IV SOLN
Freq: Once | INTRAVENOUS | Status: AC
  Filled 2020-12-31: qty 250

## 2020-12-31 MED ORDER — PALONOSETRON HCL INJECTION 0.25 MG/5ML
0.2500 mg | Freq: Once | INTRAVENOUS | Status: AC
  Administered 2020-12-31: 0.25 mg via INTRAVENOUS

## 2020-12-31 MED ORDER — SODIUM CHLORIDE 0.9 % IV SOLN
400.0000 mg/m2 | Freq: Once | INTRAVENOUS | Status: DC
  Filled 2020-12-31: qty 45

## 2020-12-31 MED ORDER — PALONOSETRON HCL INJECTION 0.25 MG/5ML
INTRAVENOUS | Status: AC
  Filled 2020-12-31: qty 5

## 2020-12-31 NOTE — Telephone Encounter (Signed)
appts made per 12/31/20 los and pt to gain sch in tx/avs   Eric [name]

## 2020-12-31 NOTE — Progress Notes (Signed)
Patient still unsure about plan for transplant. He will continue on treatment in the mean time. Since patient hasn't had any needs and is on maintenance therapy, will sign off on navigation.   Oncology Nurse Navigator Documentation  Oncology Nurse Navigator Flowsheets 12/31/2020  Abnormal Finding Date -  Confirmed Diagnosis Date -  Diagnosis Status -  Planned Course of Treatment -  Phase of Treatment -  Chemotherapy Pending- Reason: -  Chemotherapy Actual Start Date: -  Navigator Follow Up Date: -  Navigator Follow Up Reason: -  Navigation Complete Date: 12/31/2020  Post Navigation: Continue to Follow Patient? No  Reason Not Navigating Patient: Patient On Maintenance Chemotherapy  Navigator Location [facility]  Referral Date to RadOnc/MedOnc -  Navigator Encounter Type Appt/Treatment Plan Review  Telephone -  Treatment Initiated Date -  Patient Visit Type MedOnc  Treatment Phase Active Tx  Barriers/Navigation Needs Coordination of Care;Education;Employed  Education -  Interventions None Required  Acuity Level 2-Minimal Needs (1-2 Barriers Identified)  Referrals -  Coordination of Care -  Education Method -  Support Groups/Services Friends and Family  Time Spent with Patient 30

## 2020-12-31 NOTE — Progress Notes (Signed)
Okay to treat with SCr 1.72 per Dr. Marin Olp.

## 2020-12-31 NOTE — Progress Notes (Signed)
Unable to get IV Access for treatment. Pt  request to r/s for Monday 4/11. Message to scheduling.

## 2020-12-31 NOTE — Progress Notes (Signed)
Hematology and Oncology Follow Up Visit  Gabrielle Mester., MD [id number] 07/31/1951 70 y.o. 12/31/2020   Principle Diagnosis:   IgG kappa myeloma  - 1q+  Current Therapy:    CyBorD -- s/p cycle #7  - start on 06/04/2020     Interim History:  Mr. Lowden is back for follow-up.  He was able to see Dr. Laverta Baltimore down at [facility].  Dr. Laverta Baltimore was worried about his cardiac status.  As such, it does not look like there will be a stem cell transplant right now.  However, Dr. Laverta Baltimore  said that we could collect Dr. Gertie Exon stem cells at some point and keep them just in case we do do a transplant on him.  [name] is thinking about this.  So far, everything is going quite well for him.  Back in February, there is no monoclonal spike in his blood.  He is still working.  He is an ophthalmologist.  He has had no problems with cough or shortness of breath.  He has had no problems with nausea or vomiting.  He has had no bleeding.  There is no change in bowel or bladder habits.  He has had no issues with leg swelling.  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    Medications:  Current Outpatient Medications:  .  acetaminophen (TYLENOL) 500 MG tablet, Take 1,000 mg by mouth every 4 (four) hours as needed for moderate pain or headache., Disp: , Rfl:  .  amiodarone (PACERONE) 100 MG tablet, TAKE 1 TABLET(100 MG) BY MOUTH DAILY. PLEASE MAKE OVERDUE APPOINTMENT WITH DOCTOR TAYLOR BEFORE ANYMORE REFILLS. THANK YOU FIRST ATTEMPT, Disp: 90 tablet, Rfl: 3 .  apixaban (ELIQUIS) 5 MG TABS tablet, Take 1 tablet (5 mg total) by mouth 2 (two) times daily., Disp: 60 tablet, Rfl: 5 .  carvedilol (COREG) 6.25 MG tablet, TAKE 1 TABLET(6.25 MG) BY MOUTH TWICE DAILY, Disp: 180 tablet, Rfl: 1 .  docusate sodium (COLACE) 100 MG capsule, Take 100 mg by mouth daily as needed for mild constipation., Disp: , Rfl:  .  famciclovir (FAMVIR) 500 MG tablet, Take 1 tablet (500 mg total) by mouth daily., Disp: 30 tablet, Rfl: 3 .   fluticasone (FLONASE) 50 MCG/ACT nasal spray, Place 2 sprays into the nose as needed for allergies. , Disp: , Rfl:  .  furosemide (LASIX) 80 MG tablet, Take 1 tablet (80 mg total) by mouth daily., Disp: 90 tablet, Rfl: 3 .  Hypromellose (ARTIFICIAL TEARS OP), Place 1 drop into both eyes daily as needed (dry eyes)., Disp: , Rfl:  .  ipratropium (ATROVENT) 0.06 % nasal spray, Place 2 sprays into both nostrils 2 (two) times daily., Disp: 15 mL, Rfl: 3 .  isosorbide-hydrALAZINE (BIDIL) 20-37.5 MG tablet, Take 1 tablet by mouth 3 (three) times daily., Disp: 270 tablet, Rfl: 3 .  levothyroxine (SYNTHROID) 75 MCG tablet, TAKE 1 TABLET BY MOUTH DAILY IN THE MORNING EXCEPT TAKE 1/2 TABLET ON SUNDAYS IN THE MORNING, Disp: 90 tablet, Rfl: 0 .  loperamide (IMODIUM) 2 MG capsule, Take 2 mg by mouth as needed for diarrhea or loose stools., Disp: , Rfl:  .  ondansetron (ZOFRAN) 8 MG tablet, Take 1 tablet (8 mg total) by mouth 2 (two) times daily as needed for refractory nausea / vomiting. Start on day 3 after Cytoxan., Disp: 30 tablet, Rfl: 1 .  potassium chloride 20 MEQ/15ML (10%) SOLN, Take 15 mLs (20 mEq total) by mouth 2 (two) times daily. (Patient taking differently: Take  20 mEq by mouth daily.), Disp: 946 mL, Rfl: 6 .  prochlorperazine (COMPAZINE) 10 MG tablet, Take 1 tablet (10 mg total) by mouth every 6 (six) hours as needed (Nausea or vomiting)., Disp: 30 tablet, Rfl: 1 .  rosuvastatin (CRESTOR) 20 MG tablet, Take 1 tablet (20 mg total) by mouth daily., Disp: 90 tablet, Rfl: 2 .  vitamin B-12 (CYANOCOBALAMIN) 100 MCG tablet, Take 100 mcg by mouth daily., Disp: , Rfl:  .  vitamin C (ASCORBIC ACID) 500 MG tablet, Take 500 mg by mouth as needed. , Disp: , Rfl:  .  VITAMIN D PO, Take 1 capsule by mouth daily., Disp: , Rfl:   Allergies:  Allergies  Allergen Reactions  . Lactose Intolerance (Gi) Diarrhea    Past Medical History, Surgical history, Social history, and Family History were reviewed and  updated.  Review of Systems: Review of Systems  Constitutional: Negative.   HENT:  Negative.   Eyes: Negative.   Respiratory: Negative.   Cardiovascular: Negative.   Gastrointestinal: Negative.   Endocrine: Negative.   Genitourinary: Negative.    Musculoskeletal: Negative.   Skin: Negative.   Neurological: Negative.   Hematological: Negative.   Psychiatric/Behavioral: Negative.     Physical Exam:  weight is 227 lb 12 oz (103.3 kg). His oral temperature is 98.4 F (36.9 C). His blood pressure is 90/57 (abnormal) and his pulse is 70. His respiration is 17 and oxygen saturation is 100%.   Wt Readings from Last 3 Encounters:  12/31/20 227 lb 12 oz (103.3 kg)  12/03/20 229 lb (103.9 kg)  11/02/20 224 lb 8 oz (101.8 kg)    Physical Exam Vitals reviewed.  HENT:     Head: Normocephalic and atraumatic.  Eyes:     Pupils: Pupils are equal, round, and reactive to light.  Cardiovascular:     Rate and Rhythm: Normal rate and regular rhythm.     Heart sounds: Normal heart sounds.  Pulmonary:     Effort: Pulmonary effort is normal.     Breath sounds: Normal breath sounds.  Abdominal:     General: Bowel sounds are normal.     Palpations: Abdomen is soft.  Musculoskeletal:        General: No tenderness or deformity. Normal range of motion.     Cervical back: Normal range of motion.  Lymphadenopathy:     Cervical: No cervical adenopathy.  Skin:    General: Skin is warm and dry.     Findings: No erythema or rash.  Neurological:     Mental Status: He is alert and oriented to person, place, and time.  Psychiatric:        Behavior: Behavior normal.        Thought Content: Thought content normal.        Judgment: Judgment normal.      Lab Results  Component Value Date   WBC 3.5 (L) 12/31/2020   HGB 10.2 (L) 12/31/2020   HCT 30.7 (L) 12/31/2020   MCV 90.6 12/31/2020   PLT 160 12/31/2020     Chemistry      Component Value Date/Time   NA 142 12/31/2020 0919   NA 146  (H) 07/30/2020 1555   K 3.3 (L) 12/31/2020 0919   CL 107 12/31/2020 0919   CO2 26 12/31/2020 0919   BUN 25 (H) 12/31/2020 0919   BUN 28 (H) 07/30/2020 1555   CREATININE 1.72 (H) 12/31/2020 0919   CREATININE 1.95 (H) 09/08/2016 6045  Component Value Date/Time   CALCIUM 8.8 (L) 12/31/2020 0919   ALKPHOS 61 12/31/2020 0919   AST 24 12/31/2020 0919   ALT 20 12/31/2020 0919   BILITOT 0.9 12/31/2020 0919      Impression and Plan: Mr. Poage is a very nice 70 year old African-American male.  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  He has IgG kappa myeloma.  He has had 7 cycles of treatment so far.  He has done quite nicely with treatment.  For right now, we will continue him on therapy.  We might be able to adjust his therapy frequency.  If he continues to show no evidence of myeloma, we might make his treatments every other week.  For right now, we will continue 3 weeks on and 1 week off.  I will plan to see him back in another month.    Volanda Napoleon, MD 4/8/202210:49 AM

## 2021-01-01 LAB — IGG, IGA, IGM
IgA: 31 mg/dL — ABNORMAL LOW (ref 61–437)
IgG (Immunoglobin G), Serum: 328 mg/dL — ABNORMAL LOW (ref 603–1613)
IgM (Immunoglobulin M), Srm: 5 mg/dL — ABNORMAL LOW (ref 20–172)

## 2021-01-03 ENCOUNTER — Inpatient Hospital Stay: Payer: Medicare Other

## 2021-01-03 LAB — PROTEIN ELECTROPHORESIS, SERUM, WITH REFLEX
A/G Ratio: 1.5 (ref 0.7–1.7)
Albumin ELP: 3.1 g/dL (ref 2.9–4.4)
Alpha-1-Globulin: 0.3 g/dL (ref 0.0–0.4)
Alpha-2-Globulin: 0.6 g/dL (ref 0.4–1.0)
Beta Globulin: 0.9 g/dL (ref 0.7–1.3)
Gamma Globulin: 0.4 g/dL (ref 0.4–1.8)
Globulin, Total: 2.1 g/dL — ABNORMAL LOW (ref 2.2–3.9)
Total Protein ELP: 5.2 g/dL — ABNORMAL LOW (ref 6.0–8.5)

## 2021-01-03 LAB — KAPPA/LAMBDA LIGHT CHAINS
Kappa free light chain: 12.2 mg/L (ref 3.3–19.4)
Kappa, lambda light chain ratio: 1.31 (ref 0.26–1.65)
Lambda free light chains: 9.3 mg/L (ref 5.7–26.3)

## 2021-01-06 ENCOUNTER — Other Ambulatory Visit: Payer: Self-pay

## 2021-01-06 ENCOUNTER — Ambulatory Visit (INDEPENDENT_AMBULATORY_CARE_PROVIDER_SITE_OTHER): Payer: Medicare Other | Admitting: Internal Medicine

## 2021-01-06 ENCOUNTER — Encounter: Payer: Self-pay | Admitting: Internal Medicine

## 2021-01-06 VITALS — BP 146/84 | HR 72 | Ht 69.0 in | Wt 224.0 lb

## 2021-01-06 DIAGNOSIS — Z95810 Presence of automatic (implantable) cardiac defibrillator: Secondary | ICD-10-CM | POA: Diagnosis not present

## 2021-01-06 DIAGNOSIS — I4901 Ventricular fibrillation: Secondary | ICD-10-CM

## 2021-01-06 DIAGNOSIS — I480 Paroxysmal atrial fibrillation: Secondary | ICD-10-CM

## 2021-01-06 DIAGNOSIS — I428 Other cardiomyopathies: Secondary | ICD-10-CM | POA: Diagnosis not present

## 2021-01-06 NOTE — Patient Instructions (Signed)
Medication Instructions:  Your physician recommends that you continue on your current medications as directed. Please refer to the Current Medication list given to you today.  Labwork: None ordered.  Testing/Procedures: None ordered.  Follow-Up: Your physician wants you to follow-up in: one year with Cristopher Peru, MD or one of the following Advanced Practice Providers on your designated Care Team:    Chanetta Marshall, NP  Tommye Standard, PA-C  Legrand Como "Jonni Sanger" [place], [place]  Remote monitoring is used to monitor your ICD from home. This monitoring reduces the number of office visits required to check your device to one time per year. It allows [place] to keep an eye on the functioning of your device to ensure it is working properly. You are scheduled for a device check from home on 03/15/2021. You may send your transmission at any time that day. If you have a wireless device, the transmission will be sent automatically. After your physician reviews your transmission, you will receive a postcard with your next transmission date.  Any Other Special Instructions Will Be Listed Below (If Applicable).  If you need a refill on your cardiac medications before your next appointment, please call your pharmacy.

## 2021-01-06 NOTE — Progress Notes (Signed)
HPI [name] returns today for followup. 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 He had a sternal wound infection after his aortic valve replacement which finally healed. No residual drainage or infection. He is back working. No ICD shocks. He was diagnosed with multiple myeloma, underwent chemotherapy and has been in remission.  Allergies  Allergen Reactions  . Lactose Intolerance (Gi) Diarrhea     Current Outpatient Medications  Medication Sig Dispense Refill  . acetaminophen (TYLENOL) 500 MG tablet Take 1,000 mg by mouth every 4 (four) hours as needed for moderate pain or headache.    Marland Kitchen amiodarone (PACERONE) 100 MG tablet TAKE 1 TABLET(100 MG) BY MOUTH DAILY. PLEASE MAKE OVERDUE APPOINTMENT WITH DOCTOR Eric [name] BEFORE ANYMORE REFILLS. THANK YOU FIRST ATTEMPT 90 tablet 3  . apixaban (ELIQUIS) 5 MG TABS tablet Take 1 tablet (5 mg total) by mouth 2 (two) times daily. 60 tablet 5  . carvedilol (COREG) 6.25 MG tablet TAKE 1 TABLET(6.25 MG) BY MOUTH TWICE DAILY 180 tablet 1  . docusate sodium (COLACE) 100 MG capsule Take 100 mg by mouth daily as needed for mild constipation.    . famciclovir (FAMVIR) 500 MG tablet Take 1 tablet (500 mg total) by mouth daily. 30 tablet 3  . fluticasone (FLONASE) 50 MCG/ACT nasal spray Place 2 sprays into the nose as needed for allergies.     . furosemide (LASIX) 80 MG tablet Take 1 tablet (80 mg total) by mouth daily. 90 tablet 3  . Hypromellose (ARTIFICIAL TEARS OP) Place 1 drop into both eyes daily as needed (dry eyes).    Marland Kitchen ipratropium (ATROVENT) 0.06 % nasal spray Place 2 sprays into both nostrils 2 (two) times daily. 15 mL 3  . isosorbide-hydrALAZINE (BIDIL) 20-37.5 MG tablet Take 1 tablet by mouth 3 (three) times daily. 270 tablet 3  . levothyroxine (SYNTHROID) 75 MCG tablet TAKE 1 TABLET BY MOUTH DAILY IN THE MORNING EXCEPT TAKE 1/2 TABLET ON  SUNDAYS IN THE MORNING 90 tablet 0  . loperamide (IMODIUM) 2 MG capsule Take 2 mg by mouth as needed for diarrhea or loose stools.    . ondansetron (ZOFRAN) 8 MG tablet Take 1 tablet (8 mg total) by mouth 2 (two) times daily as needed for refractory nausea / vomiting. Start on day 3 after Cytoxan. 30 tablet 1  . potassium chloride 20 MEQ/15ML (10%) SOLN Take 15 mLs (20 mEq total) by mouth 2 (two) times daily. (Patient taking differently: Take 20 mEq by mouth daily.) 946 mL 6  . prochlorperazine (COMPAZINE) 10 MG tablet Take 1 tablet (10 mg total) by mouth every 6 (six) hours as needed (Nausea or vomiting). 30 tablet 1  . rosuvastatin (CRESTOR) 20 MG tablet Take 1 tablet (20 mg total) by mouth daily. 90 tablet 2  . vitamin B-12 (CYANOCOBALAMIN) 100 MCG tablet Take 100 mcg by mouth daily.    . vitamin C (ASCORBIC ACID) 500 MG tablet Take 500 mg by mouth as needed.     Marland Kitchen VITAMIN D PO Take 1 capsule by mouth daily.     No current facility-administered medications for this visit.     Past Medical History:  Diagnosis Date  . Acute blood loss anemia   . Acute encephalopathy   . Acute hypoxemic respiratory failure ([place])   . Acute idiopathic gout of right ankle   . Acute lumbar back pain   .  Acute on chronic systolic heart failure ([place])   . Acute respiratory failure ([place])   . AKI (acute kidney injury) ([place])   . Altered mental status   . Aortic aneurysm without rupture ([place]) 02/09/2016  . Aortic root enlargement ([place]) 02/13/2012  . Aortic valve regurgitation, acquired 02/13/2012  . Arrhythmia   . Atrial flutter ([place]) 03/18/2012  . Back pain   . Cardiac arrest ([place]) 02/01/2016  . CHF (congestive heart failure) ([place])   . Chronic anticoagulation 03/04/2013  . Chronic kidney disease    kidney fx studies increased   . Chronic lower back pain   . Chronic renal insufficiency   . Chronic systolic heart failure ([place]) 02/13/2012   Recent diagnosis 4 / 2013, LVEF 25% by Echo 12/2011  03/2013: Echo at [facility]  Cardiology Conclusions: 1. Left ventricular ejection fraction estimated by 2D at 40-45 percent. 2. Mild concentric left ventricular hypertrophy. 3. Mild left atrial enlargement. 4. Moderate aortic valve regurgitation. 5. The aortic root at the sinus(es) of valsalva is moderately dilated 6. Mild mitral valve regurgitation. 7.  . CKD (chronic kidney disease)   . CKD (chronic kidney disease), stage IV ([place]) 08/06/2013   Creatinine 2.4 on 07/04/13   . Claustrophobia   . Colon cancer screening 03/04/2013  . Debility 02/22/2016  . Diabetes mellitus type 2 in nonobese (HCC)   . Diabetes mellitus type 2 in obese ([place])   . Dysrhythmia    "palpitations"  . Encounter for central line placement   . Exertional dyspnea 01/2012  . 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 Goals of care, counseling/discussion 05/05/2020  . HCAP (healthcare-associated pneumonia)   . Heart murmur   . Hyperlipidemia 02/13/2012  . Hypertension   . Hypothyroidism   . Internal hemorrhoids without mention of complication 3/57/0177  . Labile blood pressure   . Left bundle branch block 02/13/2012  . Leg weakness, bilateral   . Long term (current) use of anticoagulants 08/06/2013   Eliquis therapy   . Long term current use of amiodarone 08/07/2016  . Lower extremity weakness   . Migraine 02/13/12   "opthalmic"  . Multiple myeloma ([place]) 05/05/2020  . Non-traumatic rhabdomyolysis   . Obesity (BMI 30-39.9) 02/13/2012  . Pain   . Paroxysmal atrial fibrillation (HCC)   . Pneumonia   . Retroperitoneal bleed   . Right ankle pain   . Right knee pain   . Severe aortic regurgitation 02/09/2016  . Special screening for malignant neoplasms, colon 04/11/2013  . Thyroid activity decreased   . Tibial pain   . Varicose vein of leg    right  . Ventricular fibrillation ([place]) 02/09/2016  . Weakness of both lower extremities     ROS:   All systems reviewed and negative except as noted in the HPI.   Past Surgical History:   Procedure Laterality Date  . AORTIC VALVE REPLACEMENT  11/20/2018  . CARDIAC CATHETERIZATION N/A 02/17/2016   Procedure: Left Heart Cath and Coronary Angiography;  Surgeon: Jettie Booze, MD;  Location: [place] [facility];  Service: Cardiovascular;  Laterality: N/A;  . CARDIOVERSION  03/22/2012   Procedure: CARDIOVERSION;  Surgeon: Candee Furbish, MD;  Location: [facility] ENDOSCOPY;  Service: Cardiovascular;  Laterality: N/A;  . CARDIOVERSION  04/19/2012   Procedure: CARDIOVERSION;  Surgeon: Sinclair Grooms, MD;  Location: [place];  Service: Cardiovascular;  Laterality: N/A;  . COLONOSCOPY N/A 04/11/2013   Procedure: COLONOSCOPY;  Surgeon: Inda Castle, MD;  Location:  [facility] ENDOSCOPY;  Service: Endoscopy;  Laterality: N/A;  . COLONOSCOPY N/A 04/11/2013   Procedure: COLONOSCOPY;  Surgeon: Inda Castle, MD;  Location: [facility] ENDOSCOPY;  Service: Endoscopy;  Laterality: N/A;  . EP IMPLANTABLE DEVICE N/A 02/17/2016   Procedure: BiV ICD Insertion CRT-D;  Surgeon: Evans Lance, MD;  Location: [place] [facility];  Service: Cardiovascular;  Laterality: N/A;  . FINGER SURGERY  2012   "4th digit right hand; thumb on left hand"  . RADIOLOGY WITH ANESTHESIA N/A 02/11/2016   Procedure: MRI OF THE BRAIN WITHOUT CONTRAST, LUMBAR WITHOUT CONTRAST;  Surgeon: Medication Radiologist, MD;  Location: [place];  Service: Radiology;  Laterality: N/A;  DR. WOOD/MRI  . RIGHT/LEFT HEART CATH AND CORONARY ANGIOGRAPHY N/A 07/26/2018   Procedure: RIGHT/LEFT HEART CATH AND CORONARY ANGIOGRAPHY;  Surgeon: Belva Crome, MD;  Location: [place] [facility] LAB;  Service: Cardiovascular;  Laterality: N/A;  . Skin melanocytoma excision  2012   "above left clavicle"  . STERNAL INCISION RECLOSURE  11/2018  . STERNAL WIRE REMOVAL  11/2018  . STERNAL WOUND DEBRIDEMENT  11/2018  . TEE WITHOUT CARDIOVERSION  03/22/2012   Procedure: TRANSESOPHAGEAL ECHOCARDIOGRAM (TEE);  Surgeon: Candee Furbish, MD;  Location: [facility] ENDOSCOPY;  Service: Cardiovascular;   Laterality: N/A;  . TEE WITHOUT CARDIOVERSION N/A 02/08/2016   Procedure: TRANSESOPHAGEAL ECHOCARDIOGRAM (TEE);  Surgeon: Lelon Perla, MD;  Location: [facility] ENDOSCOPY;  Service: Cardiovascular;  Laterality: N/A;     Family History  Problem Relation Age of Onset  . Hypertension Mother   . Heart disease Mother   . Heart failure Mother   . Diabetes Mother   . Hypertension Father   . Heart disease Father   . Heart failure Father      Social History   Socioeconomic History  . Marital status: Married    Spouse name: Eric [name]  . Number of children: 1  . Years of education: Not on file  . Highest education level: Not on file  Occupational History    Employer: [employer]  Tobacco Use  . Smoking status: Never Smoker  . Smokeless tobacco: Never Used  Vaping Use  . Vaping Use: Never used  Substance and Sexual Activity  . Alcohol use: Yes    Comment: rarely  . Drug use: No  . Sexual activity: Not Currently  Other Topics Concern  . 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   Social Determinants of Health   Financial Resource Strain: Low Risk   . Difficulty of Paying Living Expenses: Not hard at all  Food Insecurity: No Food Insecurity  . Worried About Charity fundraiser in the Last Year: Never true  . Ran Out of Food in the Last Year: Never true  Transportation Needs: No Transportation Needs  . Lack of Transportation (Medical): No  . Lack of Transportation (Non-Medical): No  Physical Activity: Inactive  . Days of Exercise per Week: 0 days  . Minutes of Exercise per Session: 0 min  Stress: No Stress Concern Present  . Feeling of Stress : Not at all  Social Connections: Not on file  Intimate Partner Violence: Not on file     BP (!) 146/84   Pulse 72   Ht '5\' 9"'  (1.753 m)   Wt 224 lb (101.6 kg)   SpO2 96%   BMI 33.08 kg/m   Physical Exam:  Well appearing NAD HEENT:  Unremarkable Neck:  No JVD, no thyromegally Lymphatics:  No adenopathy Back:  No CVA tenderness Lungs:  Clear with no wheezes HEART:  IRegular rate rhythm, no murmurs, no rubs, no clicks Abd:  soft, positive bowel sounds, no organomegally, no rebound, no guarding Ext:  2 plus pulses, no edema, no cyanosis, no clubbing Skin:  No rashes no nodules Neuro:  CN II through XII intact, motor grossly intact  EKG - atrial fib with PVC's and ventricular pacing  DEVICE  Normal device function.  See PaceArt for details.   Assess/Plan: 1. Atrial fib - his rate is well controlled. No change in meds. 2. VT - he will continue low dose amiodarone.  3. ICD - his St. Jude biv ICD is working normally. 4. Chronic systolic heart failure - his symptoms remain class 2. He is encouraged to maintain a low sodium diet and continue his diuretic.  Carleene Overlie Eric Menna,MD

## 2021-01-13 ENCOUNTER — Other Ambulatory Visit: Payer: Self-pay

## 2021-01-13 ENCOUNTER — Ambulatory Visit (INDEPENDENT_AMBULATORY_CARE_PROVIDER_SITE_OTHER): Payer: Medicare Other | Admitting: Internal Medicine

## 2021-01-13 ENCOUNTER — Encounter: Payer: Self-pay | Admitting: Internal Medicine

## 2021-01-13 VITALS — BP 132/86 | HR 72 | Temp 98.1°F | Ht 69.0 in | Wt 225.8 lb

## 2021-01-13 DIAGNOSIS — I5022 Chronic systolic (congestive) heart failure: Secondary | ICD-10-CM | POA: Diagnosis not present

## 2021-01-13 DIAGNOSIS — N1832 Chronic kidney disease, stage 3b: Secondary | ICD-10-CM | POA: Diagnosis not present

## 2021-01-13 DIAGNOSIS — E1122 Type 2 diabetes mellitus with diabetic chronic kidney disease: Secondary | ICD-10-CM | POA: Diagnosis not present

## 2021-01-13 DIAGNOSIS — C9000 Multiple myeloma not having achieved remission: Secondary | ICD-10-CM

## 2021-01-13 DIAGNOSIS — N183 Chronic kidney disease, stage 3 unspecified: Secondary | ICD-10-CM | POA: Diagnosis not present

## 2021-01-13 DIAGNOSIS — I129 Hypertensive chronic kidney disease with stage 1 through stage 4 chronic kidney disease, or unspecified chronic kidney disease: Secondary | ICD-10-CM

## 2021-01-13 DIAGNOSIS — I130 Hypertensive heart and chronic kidney disease with heart failure and stage 1 through stage 4 chronic kidney disease, or unspecified chronic kidney disease: Secondary | ICD-10-CM | POA: Diagnosis not present

## 2021-01-13 DIAGNOSIS — E032 Hypothyroidism due to medicaments and other exogenous substances: Secondary | ICD-10-CM | POA: Diagnosis not present

## 2021-01-13 DIAGNOSIS — I480 Paroxysmal atrial fibrillation: Secondary | ICD-10-CM | POA: Diagnosis not present

## 2021-01-13 MED ORDER — FLUTICASONE PROPIONATE 50 MCG/ACT NA SUSP: 2.0000 | Freq: Every day | NASAL | 2 refills | Status: AC

## 2021-01-13 NOTE — Patient Instructions (Signed)
Diabetes Mellitus and Foot Care Foot care is an important part of your health, especially when you have diabetes. Diabetes may cause you to have problems because of poor blood flow (circulation) to your feet and legs, which can cause your skin to:  Become thinner and drier.  Break more easily.  Heal more slowly.  Peel and crack. You may also have nerve damage (neuropathy) in your legs and feet, causing decreased feeling in them. This means that you may not notice minor injuries to your feet that could lead to more serious problems. Noticing and addressing any potential problems early is the best way to prevent future foot problems. How to care for your feet Foot hygiene  Wash your feet daily with warm water and mild soap. Do not use hot water. Then, pat your feet and the areas between your toes until they are completely dry. Do not soak your feet as this can dry your skin.  Trim your toenails straight across. Do not dig under them or around the cuticle. File the edges of your nails with an emery board or nail file.  Apply a moisturizing lotion or petroleum jelly to the skin on your feet and to dry, brittle toenails. Use lotion that does not contain alcohol and is unscented. Do not apply lotion between your toes.   Shoes and socks  Wear clean socks or stockings every day. Make sure they are not too tight. Do not wear knee-high stockings since they may decrease blood flow to your legs.  Wear shoes that fit properly and have enough cushioning. Always look in your shoes before you put them on to be sure there are no objects inside.  To break in new shoes, wear them for just a few hours a day. This prevents injuries on your feet. Wounds, scrapes, corns, and calluses  Check your feet daily for blisters, cuts, bruises, sores, and redness. If you cannot see the bottom of your feet, use a mirror or ask someone for help.  Do not cut corns or calluses or try to remove them with medicine.  If you  find a minor scrape, cut, or break in the skin on your feet, keep it and the skin around it clean and dry. You may clean these areas with mild soap and water. Do not clean the area with peroxide, alcohol, or iodine.  If you have a wound, scrape, corn, or callus on your foot, look at it several times a day to make sure it is healing and not infected. Check for: ? Redness, swelling, or pain. ? Fluid or blood. ? Warmth. ? Pus or a bad smell.   General tips  Do not cross your legs. This may decrease blood flow to your feet.  Do not use heating pads or hot water bottles on your feet. They may burn your skin. If you have lost feeling in your feet or legs, you may not know this is happening until it is too late.  Protect your feet from hot and cold by wearing shoes, such as at the beach or on hot pavement.  Schedule a complete foot exam at least once a year (annually) or more often if you have foot problems. Report any cuts, sores, or bruises to your health care provider immediately. Where to find more information  American Diabetes Association: www.diabetes.org  Association of Diabetes Care & Education Specialists: www.diabeteseducator.org Contact a health care provider if:  You have a medical condition that increases your risk of infection and   you have any cuts, sores, or bruises on your feet.  You have an injury that is not healing.  You have redness on your legs or feet.  You feel burning or tingling in your legs or feet.  You have pain or cramps in your legs and feet.  Your legs or feet are numb.  Your feet always feel cold.  You have pain around any toenails. Get help right away if:  You have a wound, scrape, corn, or callus on your foot and: ? You have pain, swelling, or redness that gets worse. ? You have fluid or blood coming from the wound, scrape, corn, or callus. ? Your wound, scrape, corn, or callus feels warm to the touch. ? You have pus or a bad smell coming from  the wound, scrape, corn, or callus. ? You have a fever. ? You have a red line going up your leg. Summary  Check your feet every day for blisters, cuts, bruises, sores, and redness.  Apply a moisturizing lotion or petroleum jelly to the skin on your feet and to dry, brittle toenails.  Wear shoes that fit properly and have enough cushioning.  If you have foot problems, report any cuts, sores, or bruises to your health care provider immediately.  Schedule a complete foot exam at least once a year (annually) or more often if you have foot problems. This information is not intended to replace advice given to you by your health care provider. Make sure you discuss any questions you have with your health care provider. Document Revised: 04/01/2020 Document Reviewed: 04/01/2020 Elsevier Patient Education  2021 [employer] Inc.  

## 2021-01-13 NOTE — Progress Notes (Signed)
I,Eric [name],acting as a Education administrator for Eric [name].,have documented all relevant documentation on the behalf of Eric [name],as directed by  Eric [name] while in the presence of Eric [name].   This visit occurred during the SARS-CoV-2 public health emergency.  Safety protocols were in place, including screening questions prior to the visit, additional usage of staff PPE, and extensive cleaning of exam room while observing appropriate contact time as indicated for disinfecting solutions.  Subjective:     Patient ID: Eric [name] , male    DOB: [birth year] , 70 y.o.   MRN: [id number]   Chief Complaint  Patient presents with  . Diabetes  . Hypertension    HPI  The patient is here for a follow-up on his diabetes and blood pressure. He has been able to control his diabetes without medication. He reports compliance with BP/heart failure meds. Denies headaches, cp. Has recent diagnosis of multiple myeloma, followed by Eric [name]. He is currently being treated with cyclophosphamide, bortezomib and dexamethasone.   Diabetes He presents for his follow-up diabetic visit. He has type 2 diabetes mellitus. There are no hypoglycemic associated symptoms. Pertinent negatives for diabetes include no blurred vision and no chest pain. There are no hypoglycemic complications. He is following a diabetic diet. Meal planning includes avoidance of concentrated sweets. He participates in exercise intermittently. His breakfast blood glucose is taken between 8-9 am. His breakfast blood glucose range is generally 110-130 mg/dl. An ACE inhibitor/angiotensin II receptor blocker is not being taken. He sees a podiatrist.Eye exam is not current.  Hypertension This is a chronic problem. The current episode started more than 1 year ago. The problem has been gradually improving since onset. The problem is controlled. Pertinent negatives include no blurred vision, chest pain, palpitations  or shortness of breath. Risk factors for coronary artery disease include diabetes mellitus, dyslipidemia, sedentary lifestyle, obesity, male gender and stress. Past treatments include beta blockers and diuretics. The current treatment provides moderate improvement. Hypertensive end-organ damage includes kidney disease.     Past Medical History:  Diagnosis Date  . Acute blood loss anemia   . Acute encephalopathy   . Acute hypoxemic respiratory failure ([place])   . Acute idiopathic gout of right ankle   . Acute lumbar back pain   . Acute on chronic systolic heart failure ([place])   . Acute respiratory failure ([place])   . AKI (acute kidney injury) ([place])   . Altered mental status   . Aortic aneurysm without rupture ([place]) 02/09/2016  . Aortic root enlargement ([place]) 02/13/2012  . Aortic valve regurgitation, acquired 02/13/2012  . Arrhythmia   . Atrial flutter ([place]) 03/18/2012  . Back pain   . Cardiac arrest ([place]) 02/01/2016  . CHF (congestive heart failure) ([place])   . Chronic anticoagulation 03/04/2013  . Chronic kidney disease    kidney fx studies increased   . Chronic lower back pain   . Chronic renal insufficiency   . Chronic systolic heart failure ([place]) 02/13/2012   Recent diagnosis 4 / 2013, LVEF 25% by Echo 12/2011  03/2013: Echo at [facility] Conclusions: 1. Left ventricular ejection fraction estimated by 2D at 40-45 percent. 2. Mild concentric left ventricular hypertrophy. 3. Mild left atrial enlargement. 4. Moderate aortic valve regurgitation. 5. The aortic root at the sinus(es) of valsalva is moderately dilated 6. Mild mitral valve regurgitation. 7.  . CKD (chronic kidney disease)   . CKD (chronic kidney disease), stage IV ([place]) 08/06/2013  Creatinine 2.4 on 07/04/13   . Claustrophobia   . Colon cancer screening 03/04/2013  . Debility 02/22/2016  . Diabetes mellitus type 2 in nonobese (HCC)   . Diabetes mellitus type 2 in obese ([place])   . Dysrhythmia    "palpitations"  . Encounter for  central line placement   . Exertional dyspnea 01/2012  . 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 Goals of care, counseling/discussion 05/05/2020  . HCAP (healthcare-associated pneumonia)   . Heart murmur   . Hyperlipidemia 02/13/2012  . Hypertension   . Hypothyroidism   . Internal hemorrhoids without mention of complication 0/05/2329  . Labile blood pressure   . Left bundle branch block 02/13/2012  . Leg weakness, bilateral   . Long term (current) use of anticoagulants 08/06/2013   Eliquis therapy   . Long term current use of amiodarone 08/07/2016  . Lower extremity weakness   . Migraine 02/13/12   "opthalmic"  . Multiple myeloma ([place]) 05/05/2020  . Non-traumatic rhabdomyolysis   . Obesity (BMI 30-39.9) 02/13/2012  . Pain   . Paroxysmal atrial fibrillation (HCC)   . Pneumonia   . Retroperitoneal bleed   . Right ankle pain   . Right knee pain   . Severe aortic regurgitation 02/09/2016  . Special screening for malignant neoplasms, colon 04/11/2013  . Thyroid activity decreased   . Tibial pain   . Varicose vein of leg    right  . Ventricular fibrillation ([place]) 02/09/2016  . Weakness of both lower extremities      Family History  Problem Relation Age of Onset  . Hypertension Mother   . Heart disease Mother   . Heart failure Mother   . Diabetes Mother   . Hypertension Father   . Heart disease Father   . Heart failure Father      Current Outpatient Medications:  .  amiodarone (PACERONE) 100 MG tablet, TAKE 1 TABLET(100 MG) BY MOUTH DAILY. PLEASE MAKE OVERDUE APPOINTMENT WITH DOCTOR TAYLOR BEFORE ANYMORE REFILLS. THANK YOU FIRST ATTEMPT, Disp: 90 tablet, Rfl: 3 .  apixaban (ELIQUIS) 5 MG TABS tablet, Take 1 tablet (5 mg total) by mouth 2 (two) times daily., Disp: 60 tablet, Rfl: 5 .  carvedilol (COREG) 6.25 MG tablet, TAKE 1 TABLET(6.25 MG) BY MOUTH TWICE DAILY, Disp: 180 tablet, Rfl: 1 .  docusate sodium (COLACE) 100 MG capsule, Take 100 mg by mouth daily as  needed for mild constipation., Disp: , Rfl:  .  famciclovir (FAMVIR) 500 MG tablet, Take 1 tablet (500 mg total) by mouth daily., Disp: 30 tablet, Rfl: 3 .  furosemide (LASIX) 80 MG tablet, Take 1 tablet (80 mg total) by mouth daily., Disp: 90 tablet, Rfl: 3 .  Hypromellose (ARTIFICIAL TEARS OP), Place 1 drop into both eyes daily as needed (dry eyes)., Disp: , Rfl:  .  ipratropium (ATROVENT) 0.06 % nasal spray, Place 2 sprays into both nostrils 2 (two) times daily., Disp: 15 mL, Rfl: 3 .  isosorbide-hydrALAZINE (BIDIL) 20-37.5 MG tablet, Take 1 tablet by mouth 3 (three) times daily., Disp: 270 tablet, Rfl: 3 .  levothyroxine (SYNTHROID) 75 MCG tablet, TAKE 1 TABLET BY MOUTH DAILY IN THE MORNING EXCEPT TAKE 1/2 TABLET ON SUNDAYS IN THE MORNING, Disp: 90 tablet, Rfl: 0 .  loperamide (IMODIUM) 2 MG capsule, Take 2 mg by mouth as needed for diarrhea or loose stools., Disp: , Rfl:  .  ondansetron (ZOFRAN) 8 MG tablet, Take 1 tablet (8 mg total) by mouth  2 (two) times daily as needed for refractory nausea / vomiting. Start on day 3 after Cytoxan., Disp: 30 tablet, Rfl: 1 .  potassium chloride 20 MEQ/15ML (10%) SOLN, Take 15 mLs (20 mEq total) by mouth 2 (two) times daily. (Patient taking differently: Take 20 mEq by mouth daily.), Disp: 946 mL, Rfl: 6 .  prochlorperazine (COMPAZINE) 10 MG tablet, Take 1 tablet (10 mg total) by mouth every 6 (six) hours as needed (Nausea or vomiting)., Disp: 30 tablet, Rfl: 1 .  rosuvastatin (CRESTOR) 20 MG tablet, Take 1 tablet (20 mg total) by mouth daily., Disp: 90 tablet, Rfl: 2 .  vitamin B-12 (CYANOCOBALAMIN) 100 MCG tablet, Take 100 mcg by mouth daily., Disp: , Rfl:  .  vitamin C (ASCORBIC ACID) 500 MG tablet, Take 500 mg by mouth daily., Disp: , Rfl:  .  VITAMIN D PO, Take 1 capsule by mouth daily., Disp: , Rfl:  .  acetaminophen (TYLENOL) 500 MG tablet, Take 1,000 mg by mouth every 4 (four) hours as needed for moderate pain or headache., Disp: , Rfl:  .  fluticasone  (FLONASE) 50 MCG/ACT nasal spray, Place 2 sprays into both nostrils daily., Disp: 16 g, Rfl: 2   Allergies  Allergen Reactions  . Lactose Intolerance (Gi) Diarrhea     Review of Systems  Constitutional: Negative.   Eyes: Negative for blurred vision.  Respiratory: Negative.  Negative for shortness of breath.   Cardiovascular: Negative.  Negative for chest pain and palpitations.  Gastrointestinal: Negative.   Neurological: Negative.   Psychiatric/Behavioral: Negative.      Today's Vitals   01/13/21 1620  BP: 132/86  Pulse: 72  Temp: 98.1 F (36.7 C)  TempSrc: Oral  Weight: 225 lb 12.8 oz (102.4 kg)  Height: 5' 9" (1.753 m)   Body mass index is 33.34 kg/m.   Objective:  Physical Exam Vitals and nursing note reviewed.  Constitutional:      Appearance: Normal appearance.  HENT:     Head: Normocephalic and atraumatic.     Nose:     Comments: Masked     Mouth/Throat:     Comments: Masked  Eyes:     Extraocular Movements: Extraocular movements intact.  Cardiovascular:     Rate and Rhythm: Normal rate and regular rhythm.     Heart sounds: Murmur heard.    Pulmonary:     Effort: Pulmonary effort is normal.     Breath sounds: Normal breath sounds.  Musculoskeletal:     Cervical back: Normal range of motion.  Skin:    General: Skin is warm.  Neurological:     General: No focal deficit present.     Mental Status: He is alert.  Psychiatric:        Mood and Affect: Mood normal.         Assessment And Plan:     1. Type 2 diabetes mellitus with stage 3b chronic kidney disease, without long-term current use of insulin ([place]) Comments: I will check an a1c today. An increase would not be unexpected given use of dexamethasone. We discussed use of Farxiga for improved BS control and to address CKD. Will discuss further once I have reviewed his lab results.  I will also discuss with Cardiology, his lasix may need to be adjusted once Wilder Glade is started. I also suggest Renal  f/u.  - Hemoglobin A1c  2. Hypertensive heart and renal disease with renal failure, stage 1 through stage 4 or unspecified chronic kidney disease, with heart  failure ([place]) Comments: Chronic, controlled. He is encouraged to follow low sodium diet. Again, dietary compliance is of utmost importance.   3. Chronic systolic heart failure (HCC) Comments: Chronic, appears euvolemic.   4. Paroxysmal atrial fibrillation (HCC) Comments: Chronic, rate controlled and adequately anticoagulated.   5. Hypothyroidism due to medication Comments: I will check TSH and adjust meds as needed.  - TSH  6. Multiple myeloma not having achieved remission ([place]) Comments: Recent Onc note reviewed, he is tolerating chemotherapy fairly well. He is considerating freezing stem cells, may consider transplant in the future.    Patient was given opportunity to ask questions. Patient verbalized understanding of the plan and was able to repeat key elements of the plan. All questions were answered to their satisfaction.   I, Eric [name], have reviewed all documentation for this visit. The documentation on 01/13/21 for the exam, diagnosis, procedures, and orders are all accurate and complete.   IF YOU HAVE BEEN REFERRED TO A SPECIALIST, IT MAY TAKE 1-2 WEEKS TO SCHEDULE/PROCESS THE REFERRAL. IF YOU HAVE NOT HEARD FROM US/SPECIALIST IN TWO WEEKS, PLEASE GIVE [place] A CALL AT [phone].   THE PATIENT IS ENCOURAGED TO PRACTICE SOCIAL DISTANCING DUE TO THE COVID-19 PANDEMIC.

## 2021-01-14 LAB — HEMOGLOBIN A1C
Est. average glucose Bld gHb Est-mCnc: 137 mg/dL
Hgb A1c MFr Bld: 6.4 % — ABNORMAL HIGH (ref 4.8–5.6)

## 2021-01-14 LAB — TSH: TSH: 2.41 u[IU]/mL (ref 0.450–4.500)

## 2021-01-26 ENCOUNTER — Telehealth: Payer: Self-pay

## 2021-01-26 NOTE — Telephone Encounter (Signed)
Called pt per secure chat message and pts vm to sch the rest of May, appts have been made and pt was called but his vm is full.  Pt may gain his sch at his 01/28/21 appt and through [employer]

## 2021-01-28 ENCOUNTER — Encounter: Payer: Self-pay | Admitting: Internal Medicine

## 2021-01-28 ENCOUNTER — Inpatient Hospital Stay: Payer: Medicare Other

## 2021-01-28 ENCOUNTER — Inpatient Hospital Stay: Payer: Medicare Other | Attending: Family

## 2021-01-28 ENCOUNTER — Inpatient Hospital Stay (HOSPITAL_BASED_OUTPATIENT_CLINIC_OR_DEPARTMENT_OTHER): Payer: Medicare Other | Admitting: Hematology & Oncology

## 2021-01-28 ENCOUNTER — Other Ambulatory Visit: Payer: Self-pay

## 2021-01-28 ENCOUNTER — Encounter: Payer: Self-pay | Admitting: Hematology & Oncology

## 2021-01-28 VITALS — BP 142/91 | HR 90 | Temp 98.6°F | Resp 16 | Wt 223.0 lb

## 2021-01-28 DIAGNOSIS — C9000 Multiple myeloma not having achieved remission: Secondary | ICD-10-CM | POA: Diagnosis not present

## 2021-01-28 DIAGNOSIS — N289 Disorder of kidney and ureter, unspecified: Secondary | ICD-10-CM | POA: Insufficient documentation

## 2021-01-28 DIAGNOSIS — M7989 Other specified soft tissue disorders: Secondary | ICD-10-CM | POA: Diagnosis not present

## 2021-01-28 DIAGNOSIS — C9001 Multiple myeloma in remission: Secondary | ICD-10-CM

## 2021-01-28 DIAGNOSIS — Z79899 Other long term (current) drug therapy: Secondary | ICD-10-CM | POA: Insufficient documentation

## 2021-01-28 LAB — CBC WITH DIFFERENTIAL (CANCER CENTER ONLY)
Abs Immature Granulocytes: 0.02 10*3/uL (ref 0.00–0.07)
Basophils Absolute: 0 10*3/uL (ref 0.0–0.1)
Basophils Relative: 0 %
Eosinophils Absolute: 0.1 10*3/uL (ref 0.0–0.5)
Eosinophils Relative: 1 %
HCT: 33.1 % — ABNORMAL LOW (ref 39.0–52.0)
Hemoglobin: 10.9 g/dL — ABNORMAL LOW (ref 13.0–17.0)
Immature Granulocytes: 0 %
Lymphocytes Relative: 11 %
Lymphs Abs: 0.6 10*3/uL — ABNORMAL LOW (ref 0.7–4.0)
MCH: 30 pg (ref 26.0–34.0)
MCHC: 32.9 g/dL (ref 30.0–36.0)
MCV: 91.2 fL (ref 80.0–100.0)
Monocytes Absolute: 0.9 10*3/uL (ref 0.1–1.0)
Monocytes Relative: 16 %
Neutro Abs: 4.3 10*3/uL (ref 1.7–7.7)
Neutrophils Relative %: 72 %
Platelet Count: 155 10*3/uL (ref 150–400)
RBC: 3.63 MIL/uL — ABNORMAL LOW (ref 4.22–5.81)
RDW: 17.7 % — ABNORMAL HIGH (ref 11.5–15.5)
WBC Count: 6 10*3/uL (ref 4.0–10.5)
nRBC: 0 % (ref 0.0–0.2)

## 2021-01-28 LAB — CMP (CANCER CENTER ONLY)
ALT: 16 U/L (ref 0–44)
AST: 23 U/L (ref 15–41)
Albumin: 3.8 g/dL (ref 3.5–5.0)
Alkaline Phosphatase: 62 U/L (ref 38–126)
Anion gap: 8 (ref 5–15)
BUN: 20 mg/dL (ref 8–23)
CO2: 28 mmol/L (ref 22–32)
Calcium: 8.7 mg/dL — ABNORMAL LOW (ref 8.9–10.3)
Chloride: 104 mmol/L (ref 98–111)
Creatinine: 1.63 mg/dL — ABNORMAL HIGH (ref 0.61–1.24)
GFR, Estimated: 45 mL/min — ABNORMAL LOW (ref >60)
Glucose, Bld: 148 mg/dL — ABNORMAL HIGH (ref 70–99)
Potassium: 3.3 mmol/L — ABNORMAL LOW (ref 3.5–5.1)
Sodium: 140 mmol/L (ref 135–145)
Total Bilirubin: 0.9 mg/dL (ref 0.3–1.2)
Total Protein: 5.6 g/dL — ABNORMAL LOW (ref 6.5–8.1)

## 2021-01-28 LAB — LACTATE DEHYDROGENASE: LDH: 246 U/L — ABNORMAL HIGH (ref 98–192)

## 2021-01-28 MED ORDER — BORTEZOMIB CHEMO SQ INJECTION 3.5 MG (2.5MG/ML)
1.3000 mg/m2 | Freq: Once | INTRAMUSCULAR | Status: AC
  Administered 2021-01-28: 3 mg via SUBCUTANEOUS
  Filled 2021-01-28: qty 1.2

## 2021-01-28 MED ORDER — PALONOSETRON HCL INJECTION 0.25 MG/5ML
INTRAVENOUS | Status: AC
  Filled 2021-01-28: qty 5

## 2021-01-28 MED ORDER — SODIUM CHLORIDE 0.9 % IV SOLN
Freq: Once | INTRAVENOUS | Status: AC
  Filled 2021-01-28: qty 250

## 2021-01-28 MED ORDER — SODIUM CHLORIDE 0.9 % IV SOLN
20.0000 mg | Freq: Once | INTRAVENOUS | Status: AC
  Administered 2021-01-28: 20 mg via INTRAVENOUS
  Filled 2021-01-28: qty 20

## 2021-01-28 MED ORDER — PALONOSETRON HCL INJECTION 0.25 MG/5ML
0.2500 mg | Freq: Once | INTRAVENOUS | Status: AC
  Administered 2021-01-28: 0.25 mg via INTRAVENOUS

## 2021-01-28 MED ORDER — SODIUM CHLORIDE 0.9 % IV SOLN
400.0000 mg/m2 | Freq: Once | INTRAVENOUS | Status: AC
  Administered 2021-01-28: 900 mg via INTRAVENOUS
  Filled 2021-01-28: qty 45

## 2021-01-28 NOTE — Progress Notes (Signed)
Okay to treat with SCr 1.63 per Dr. Marin Olp.

## 2021-01-28 NOTE — Progress Notes (Signed)
Hematology and Oncology Follow Up Visit  Eric [name]., MD [id number] 10-30-1950 70 y.o. 01/28/2021   Principle Diagnosis:   IgG kappa myeloma  - 1q+  Current Therapy:    CyBorD -- s/p cycle #8 - start on 06/04/2020     Interim History:  [name] is back for follow-up.  He was doing pretty well.  He has no specific complaints.  There has been no problems with significant fatigue or weakness.  He does have some lack of stamina.  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  I told him that he can talk with Dr. Laverta Baltimore about this.  We can certainly adjust his protocols for stem cell collection.  His last myeloma studies did not show a monoclonal spike in his blood.  His IgG level was 328 mg/dL.  The Kappa light chain was 1.2 mg/dL.  He has had no change in bowel or bladder habits.  He still has renal insufficiency.  He has some anemia which is more so from renal insufficiency.  There has been some of leg swelling.  It has not been all that significant.  He has had no rashes.  Overall, I would say his performance status is ECOG 1.    Medications:  Current Outpatient Medications:  .  acetaminophen (TYLENOL) 500 MG tablet, Take 1,000 mg by mouth every 4 (four) hours as needed for moderate pain or headache., Disp: , Rfl:  .  amiodarone (PACERONE) 100 MG tablet, TAKE 1 TABLET(100 MG) BY MOUTH DAILY. PLEASE MAKE OVERDUE APPOINTMENT WITH DOCTOR TAYLOR BEFORE ANYMORE REFILLS. THANK YOU FIRST ATTEMPT, Disp: 90 tablet, Rfl: 3 .  apixaban (ELIQUIS) 5 MG TABS tablet, Take 1 tablet (5 mg total) by mouth 2 (two) times daily., Disp: 60 tablet, Rfl: 5 .  carvedilol (COREG) 6.25 MG tablet, TAKE 1 TABLET(6.25 MG) BY MOUTH TWICE DAILY, Disp: 180 tablet, Rfl: 1 .  docusate sodium (COLACE) 100 MG capsule, Take 100 mg by mouth daily as needed for mild constipation., Disp: , Rfl:  .  famciclovir (FAMVIR) 500 MG tablet, Take 1 tablet (500 mg  total) by mouth daily., Disp: 30 tablet, Rfl: 3 .  fluticasone (FLONASE) 50 MCG/ACT nasal spray, Place 2 sprays into both nostrils daily., Disp: 16 g, Rfl: 2 .  furosemide (LASIX) 80 MG tablet, Take 1 tablet (80 mg total) by mouth daily., Disp: 90 tablet, Rfl: 3 .  Hypromellose (ARTIFICIAL TEARS OP), Place 1 drop into both eyes daily as needed (dry eyes)., Disp: , Rfl:  .  ipratropium (ATROVENT) 0.06 % nasal spray, Place 2 sprays into both nostrils 2 (two) times daily., Disp: 15 mL, Rfl: 3 .  isosorbide-hydrALAZINE (BIDIL) 20-37.5 MG tablet, Take 1 tablet by mouth 3 (three) times daily., Disp: 270 tablet, Rfl: 3 .  levothyroxine (SYNTHROID) 75 MCG tablet, TAKE 1 TABLET BY MOUTH DAILY IN THE MORNING EXCEPT TAKE 1/2 TABLET ON SUNDAYS IN THE MORNING, Disp: 90 tablet, Rfl: 0 .  loperamide (IMODIUM) 2 MG capsule, Take 2 mg by mouth as needed for diarrhea or loose stools., Disp: , Rfl:  .  ondansetron (ZOFRAN) 8 MG tablet, Take 1 tablet (8 mg total) by mouth 2 (two) times daily as needed for refractory nausea / vomiting. Start on day 3 after Cytoxan., Disp: 30 tablet, Rfl: 1 .  potassium chloride 20 MEQ/15ML (10%) SOLN, Take 15 mLs (20 mEq total) by mouth 2 (two) times daily. (Patient taking differently:  Take 20 mEq by mouth daily.), Disp: 946 mL, Rfl: 6 .  prochlorperazine (COMPAZINE) 10 MG tablet, Take 1 tablet (10 mg total) by mouth every 6 (six) hours as needed (Nausea or vomiting)., Disp: 30 tablet, Rfl: 1 .  rosuvastatin (CRESTOR) 20 MG tablet, Take 1 tablet (20 mg total) by mouth daily., Disp: 90 tablet, Rfl: 2 .  vitamin B-12 (CYANOCOBALAMIN) 100 MCG tablet, Take 100 mcg by mouth daily., Disp: , Rfl:  .  vitamin C (ASCORBIC ACID) 500 MG tablet, Take 500 mg by mouth daily., Disp: , Rfl:  .  VITAMIN D PO, Take 1 capsule by mouth daily., Disp: , Rfl:   Allergies:  Allergies  Allergen Reactions  . Lactose Intolerance (Gi) Diarrhea    Past Medical History, Surgical history, Social history, and  Family History were reviewed and updated.  Review of Systems: Review of Systems  Constitutional: Negative.   HENT:  Negative.   Eyes: Negative.   Respiratory: Negative.   Cardiovascular: Negative.   Gastrointestinal: Negative.   Endocrine: Negative.   Genitourinary: Negative.    Musculoskeletal: Negative.   Skin: Negative.   Neurological: Negative.   Hematological: Negative.   Psychiatric/Behavioral: Negative.     Physical Exam:  weight is 223 lb (101.2 kg). His oral temperature is 98.6 F (37 C). His blood pressure is 142/91 (abnormal) and his pulse is 90. His respiration is 16 and oxygen saturation is 100%.   Wt Readings from Last 3 Encounters:  01/28/21 223 lb (101.2 kg)  01/13/21 225 lb 12.8 oz (102.4 kg)  01/06/21 224 lb (101.6 kg)    Physical Exam Vitals reviewed.  HENT:     Head: Normocephalic and atraumatic.  Eyes:     Pupils: Pupils are equal, round, and reactive to light.  Cardiovascular:     Rate and Rhythm: Normal rate and regular rhythm.     Heart sounds: Normal heart sounds.  Pulmonary:     Effort: Pulmonary effort is normal.     Breath sounds: Normal breath sounds.  Abdominal:     General: Bowel sounds are normal.     Palpations: Abdomen is soft.  Musculoskeletal:        General: No tenderness or deformity. Normal range of motion.     Cervical back: Normal range of motion.  Lymphadenopathy:     Cervical: No cervical adenopathy.  Skin:    General: Skin is warm and dry.     Findings: No erythema or rash.  Neurological:     Mental Status: He is alert and oriented to person, place, and time.  Psychiatric:        Behavior: Behavior normal.        Thought Content: Thought content normal.        Judgment: Judgment normal.      Lab Results  Component Value Date   WBC 6.0 01/28/2021   HGB 10.9 (L) 01/28/2021   HCT 33.1 (L) 01/28/2021   MCV 91.2 01/28/2021   PLT 155 01/28/2021     Chemistry      Component Value Date/Time   NA 142  12/31/2020 0919   NA 146 (H) 07/30/2020 1555   K 3.3 (L) 12/31/2020 0919   CL 107 12/31/2020 0919   CO2 26 12/31/2020 0919   BUN 25 (H) 12/31/2020 0919   BUN 28 (H) 07/30/2020 1555   CREATININE 1.72 (H) 12/31/2020 0919   CREATININE 1.95 (H) 09/08/2016 0859      Component Value Date/Time   CALCIUM  8.8 (L) 12/31/2020 0919   ALKPHOS 61 12/31/2020 0919   AST 24 12/31/2020 0919   ALT 20 12/31/2020 0919   BILITOT 0.9 12/31/2020 0919      Impression and Plan: Mr. Chiu is a very nice 70 year old African-American male.  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  He has IgG kappa myeloma.  He is finishing up the eighth cycle.  Again, I am not sure when he will want to collect his stem cells.  For right now, we are treating him every other week.  This seems to be helping.  I will plan to see him back at the end of of May.    Volanda Napoleon, MD 5/6/202210:20 AM

## 2021-01-29 LAB — IGG, IGA, IGM
IgA: 60 mg/dL — ABNORMAL LOW (ref 61–437)
IgG (Immunoglobin G), Serum: 410 mg/dL — ABNORMAL LOW (ref 603–1613)
IgM (Immunoglobulin M), Srm: 13 mg/dL — ABNORMAL LOW (ref 20–172)

## 2021-01-31 LAB — KAPPA/LAMBDA LIGHT CHAINS
Kappa free light chain: 14 mg/L (ref 3.3–19.4)
Kappa, lambda light chain ratio: 1.23 (ref 0.26–1.65)
Lambda free light chains: 11.4 mg/L (ref 5.7–26.3)

## 2021-01-31 NOTE — Progress Notes (Signed)
Dates and cycles are off due to rescheduled appointments. Will change 5/13 orders to cycle 9, day 8 per Dr. Antonieta Pert instructions. 5/6 was cycle 9, day 1.

## 2021-02-01 ENCOUNTER — Other Ambulatory Visit: Payer: Self-pay | Admitting: Internal Medicine

## 2021-02-01 LAB — IMMUNOFIXATION REFLEX, SERUM
IgA: 61 mg/dL (ref 61–437)
IgG (Immunoglobin G), Serum: 422 mg/dL — ABNORMAL LOW (ref 603–1613)
IgM (Immunoglobulin M), Srm: 15 mg/dL — ABNORMAL LOW (ref 20–172)

## 2021-02-01 LAB — PROTEIN ELECTROPHORESIS, SERUM, WITH REFLEX
A/G Ratio: 1.5 (ref 0.7–1.7)
Albumin ELP: 3.2 g/dL (ref 2.9–4.4)
Alpha-1-Globulin: 0.3 g/dL (ref 0.0–0.4)
Alpha-2-Globulin: 0.6 g/dL (ref 0.4–1.0)
Beta Globulin: 1 g/dL (ref 0.7–1.3)
Gamma Globulin: 0.3 g/dL — ABNORMAL LOW (ref 0.4–1.8)
Globulin, Total: 2.2 g/dL (ref 2.2–3.9)
SPEP Interpretation: 0
Total Protein ELP: 5.4 g/dL — ABNORMAL LOW (ref 6.0–8.5)

## 2021-02-04 ENCOUNTER — Other Ambulatory Visit: Payer: Self-pay

## 2021-02-04 ENCOUNTER — Inpatient Hospital Stay: Payer: Medicare Other

## 2021-02-04 VITALS — BP 130/77 | HR 88 | Temp 98.3°F | Resp 18

## 2021-02-04 DIAGNOSIS — C9000 Multiple myeloma not having achieved remission: Secondary | ICD-10-CM

## 2021-02-04 DIAGNOSIS — M7989 Other specified soft tissue disorders: Secondary | ICD-10-CM | POA: Diagnosis not present

## 2021-02-04 DIAGNOSIS — Z79899 Other long term (current) drug therapy: Secondary | ICD-10-CM | POA: Diagnosis not present

## 2021-02-04 DIAGNOSIS — N289 Disorder of kidney and ureter, unspecified: Secondary | ICD-10-CM | POA: Diagnosis not present

## 2021-02-04 LAB — COMPREHENSIVE METABOLIC PANEL
ALT: 18 U/L (ref 0–44)
AST: 23 U/L (ref 15–41)
Albumin: 3.7 g/dL (ref 3.5–5.0)
Alkaline Phosphatase: 67 U/L (ref 38–126)
Anion gap: 8 (ref 5–15)
BUN: 31 mg/dL — ABNORMAL HIGH (ref 8–23)
CO2: 27 mmol/L (ref 22–32)
Calcium: 8.6 mg/dL — ABNORMAL LOW (ref 8.9–10.3)
Chloride: 104 mmol/L (ref 98–111)
Creatinine, Ser: 1.78 mg/dL — ABNORMAL HIGH (ref 0.61–1.24)
GFR, Estimated: 41 mL/min — ABNORMAL LOW (ref >60)
Glucose, Bld: 185 mg/dL — ABNORMAL HIGH (ref 70–99)
Potassium: 3.2 mmol/L — ABNORMAL LOW (ref 3.5–5.1)
Sodium: 139 mmol/L (ref 135–145)
Total Bilirubin: 0.8 mg/dL (ref 0.3–1.2)
Total Protein: 5.3 g/dL — ABNORMAL LOW (ref 6.5–8.1)

## 2021-02-04 LAB — CBC WITH DIFFERENTIAL/PLATELET
Abs Immature Granulocytes: 0.03 10*3/uL (ref 0.00–0.07)
Basophils Absolute: 0 10*3/uL (ref 0.0–0.1)
Basophils Relative: 0 %
Eosinophils Absolute: 0.1 10*3/uL (ref 0.0–0.5)
Eosinophils Relative: 1 %
HCT: 31.7 % — ABNORMAL LOW (ref 39.0–52.0)
Hemoglobin: 10.6 g/dL — ABNORMAL LOW (ref 13.0–17.0)
Immature Granulocytes: 1 %
Lymphocytes Relative: 9 %
Lymphs Abs: 0.5 10*3/uL — ABNORMAL LOW (ref 0.7–4.0)
MCH: 29.9 pg (ref 26.0–34.0)
MCHC: 33.4 g/dL (ref 30.0–36.0)
MCV: 89.5 fL (ref 80.0–100.0)
Monocytes Absolute: 1 10*3/uL (ref 0.1–1.0)
Monocytes Relative: 19 %
Neutro Abs: 3.7 10*3/uL (ref 1.7–7.7)
Neutrophils Relative %: 70 %
Platelets: 125 10*3/uL — ABNORMAL LOW (ref 150–400)
RBC: 3.54 MIL/uL — ABNORMAL LOW (ref 4.22–5.81)
RDW: 17.9 % — ABNORMAL HIGH (ref 11.5–15.5)
WBC: 5.2 10*3/uL (ref 4.0–10.5)
nRBC: 0 % (ref 0.0–0.2)

## 2021-02-04 MED ORDER — PALONOSETRON HCL INJECTION 0.25 MG/5ML
INTRAVENOUS | Status: AC
  Filled 2021-02-04: qty 5

## 2021-02-04 MED ORDER — SODIUM CHLORIDE 0.9 % IV SOLN
400.0000 mg/m2 | Freq: Once | INTRAVENOUS | Status: AC
  Administered 2021-02-04: 900 mg via INTRAVENOUS
  Filled 2021-02-04: qty 40

## 2021-02-04 MED ORDER — SODIUM CHLORIDE 0.9 % IV SOLN
Freq: Once | INTRAVENOUS | Status: AC
  Filled 2021-02-04: qty 250

## 2021-02-04 MED ORDER — SODIUM CHLORIDE 0.9 % IV SOLN
20.0000 mg | Freq: Once | INTRAVENOUS | Status: AC
  Administered 2021-02-04: 20 mg via INTRAVENOUS
  Filled 2021-02-04: qty 20

## 2021-02-04 MED ORDER — BORTEZOMIB CHEMO SQ INJECTION 3.5 MG (2.5MG/ML)
1.3000 mg/m2 | Freq: Once | INTRAMUSCULAR | Status: AC
  Administered 2021-02-04: 3 mg via SUBCUTANEOUS
  Filled 2021-02-04: qty 1.2

## 2021-02-04 MED ORDER — PALONOSETRON HCL INJECTION 0.25 MG/5ML
0.2500 mg | Freq: Once | INTRAVENOUS | Status: AC
  Administered 2021-02-04: 0.25 mg via INTRAVENOUS

## 2021-02-04 NOTE — Progress Notes (Signed)
OK to treat with today's lab results per Dr. Ennever. 

## 2021-02-04 NOTE — Patient Instructions (Signed)
[facility]  Discharge Instructions: Thank you for choosing [facility] to provide your oncology and hematology care.   If you have a lab appointment with the Cancer Center, please go directly to the Cancer Center and check in at the registration area.  Wear comfortable clothing and clothing appropriate for easy access to any Portacath or PICC line.   We strive to give you quality time with your provider. You may need to reschedule your appointment if you arrive late (15 or more minutes).  Arriving late affects you and other patients whose appointments are after yours.  Also, if you miss three or more appointments without notifying the office, you may be dismissed from the clinic at the provider's discretion.      For prescription refill requests, have your pharmacy contact our office and allow 72 hours for refills to be completed.    Today you received the following chemotherapy and/or immunotherapy agents Cytoxan, Velcade.      To help prevent nausea and vomiting after your treatment, we encourage you to take your nausea medication as directed.  BELOW ARE SYMPTOMS THAT SHOULD BE REPORTED IMMEDIATELY: *FEVER GREATER THAN 100.4 F (38 C) OR HIGHER *CHILLS OR SWEATING *NAUSEA AND VOMITING THAT IS NOT CONTROLLED WITH YOUR NAUSEA MEDICATION *UNUSUAL SHORTNESS OF BREATH *UNUSUAL BRUISING OR BLEEDING *URINARY PROBLEMS (pain or burning when urinating, or frequent urination) *BOWEL PROBLEMS (unusual diarrhea, constipation, pain near the anus) TENDERNESS IN MOUTH AND THROAT WITH OR WITHOUT PRESENCE OF ULCERS (sore throat, sores in mouth, or a toothache) UNUSUAL RASH, SWELLING OR PAIN  UNUSUAL VAGINAL DISCHARGE OR ITCHING   Items with * indicate a potential emergency and should be followed up as soon as possible or go to the Emergency Department if any problems should occur.  Please show the CHEMOTHERAPY ALERT CARD or IMMUNOTHERAPY ALERT CARD at check-in  to the Emergency Department and triage nurse. Should you have questions after your visit or need to cancel or reschedule your appointment, please contact [facility]  [phone] and follow the prompts.  Office hours are 8:00 a.m. to 4:30 p.m. Monday - Friday. Please note that voicemails left after 4:00 p.m. may not be returned until the following business day.  We are closed weekends and major holidays. You have access to a nurse at all times for urgent questions. Please call the main number to the clinic [phone] and follow the prompts.  For any non-urgent questions, you may also contact your provider using MyChart. We now offer e-Visits for anyone 18 and older to request care online for non-urgent symptoms. For details visit [email or web address].   Also download the MyChart app! Go to the app store, search "MyChart", open the app, select [facility], and log in with your MyChart username and password.  Due to Covid, a mask is required upon entering the hospital/clinic. If you do not have a mask, one will be given to you upon arrival. For doctor visits, patients may have 1 support person aged 18 or older with them. For treatment visits, patients cannot have anyone with them due to current Covid guidelines and our immunocompromised population.  

## 2021-02-08 ENCOUNTER — Telehealth: Payer: Self-pay | Admitting: *Deleted

## 2021-02-08 NOTE — Telephone Encounter (Signed)
Patient now being treated every 2 weeks.  Next appointment would be May 27.  Patient already has appointment for 02/25/21 with Dr Marin Olp, lab and chemo.  [place] with Dr Marin Olp to have this additional week.

## 2021-02-11 ENCOUNTER — Inpatient Hospital Stay: Payer: Medicare Other

## 2021-02-25 ENCOUNTER — Other Ambulatory Visit: Payer: Self-pay

## 2021-02-25 ENCOUNTER — Inpatient Hospital Stay: Payer: Medicare Other

## 2021-02-25 ENCOUNTER — Inpatient Hospital Stay (HOSPITAL_BASED_OUTPATIENT_CLINIC_OR_DEPARTMENT_OTHER): Payer: Medicare Other | Admitting: Hematology & Oncology

## 2021-02-25 ENCOUNTER — Encounter: Payer: Self-pay | Admitting: Hematology & Oncology

## 2021-02-25 ENCOUNTER — Inpatient Hospital Stay: Payer: Medicare Other | Attending: Family

## 2021-02-25 VITALS — BP 103/66 | HR 88 | Temp 98.2°F | Resp 20 | Wt 218.8 lb

## 2021-02-25 DIAGNOSIS — C9001 Multiple myeloma in remission: Secondary | ICD-10-CM

## 2021-02-25 DIAGNOSIS — Z79899 Other long term (current) drug therapy: Secondary | ICD-10-CM | POA: Diagnosis not present

## 2021-02-25 DIAGNOSIS — Z5112 Encounter for antineoplastic immunotherapy: Secondary | ICD-10-CM | POA: Diagnosis not present

## 2021-02-25 DIAGNOSIS — Z7901 Long term (current) use of anticoagulants: Secondary | ICD-10-CM | POA: Diagnosis not present

## 2021-02-25 DIAGNOSIS — Z5111 Encounter for antineoplastic chemotherapy: Secondary | ICD-10-CM | POA: Insufficient documentation

## 2021-02-25 DIAGNOSIS — C9000 Multiple myeloma not having achieved remission: Secondary | ICD-10-CM | POA: Diagnosis not present

## 2021-02-25 LAB — CMP (CANCER CENTER ONLY)
ALT: 22 U/L (ref 0–44)
AST: 28 U/L (ref 15–41)
Albumin: 3.7 g/dL (ref 3.5–5.0)
Alkaline Phosphatase: 60 U/L (ref 38–126)
Anion gap: 8 (ref 5–15)
BUN: 37 mg/dL — ABNORMAL HIGH (ref 8–23)
CO2: 26 mmol/L (ref 22–32)
Calcium: 8.9 mg/dL (ref 8.9–10.3)
Chloride: 106 mmol/L (ref 98–111)
Creatinine: 2.12 mg/dL — ABNORMAL HIGH (ref 0.61–1.24)
GFR, Estimated: 33 mL/min — ABNORMAL LOW (ref >60)
Glucose, Bld: 123 mg/dL — ABNORMAL HIGH (ref 70–99)
Potassium: 3.6 mmol/L (ref 3.5–5.1)
Sodium: 140 mmol/L (ref 135–145)
Total Bilirubin: 0.8 mg/dL (ref 0.3–1.2)
Total Protein: 5.4 g/dL — ABNORMAL LOW (ref 6.5–8.1)

## 2021-02-25 LAB — CBC WITH DIFFERENTIAL (CANCER CENTER ONLY)
Abs Immature Granulocytes: 0.01 10*3/uL (ref 0.00–0.07)
Basophils Absolute: 0 10*3/uL (ref 0.0–0.1)
Basophils Relative: 1 %
Eosinophils Absolute: 0 10*3/uL (ref 0.0–0.5)
Eosinophils Relative: 1 %
HCT: 31.9 % — ABNORMAL LOW (ref 39.0–52.0)
Hemoglobin: 10.4 g/dL — ABNORMAL LOW (ref 13.0–17.0)
Immature Granulocytes: 0 %
Lymphocytes Relative: 10 %
Lymphs Abs: 0.4 10*3/uL — ABNORMAL LOW (ref 0.7–4.0)
MCH: 29.7 pg (ref 26.0–34.0)
MCHC: 32.6 g/dL (ref 30.0–36.0)
MCV: 91.1 fL (ref 80.0–100.0)
Monocytes Absolute: 0.8 10*3/uL (ref 0.1–1.0)
Monocytes Relative: 19 %
Neutro Abs: 2.9 10*3/uL (ref 1.7–7.7)
Neutrophils Relative %: 69 %
Platelet Count: 144 10*3/uL — ABNORMAL LOW (ref 150–400)
RBC: 3.5 MIL/uL — ABNORMAL LOW (ref 4.22–5.81)
RDW: 17.6 % — ABNORMAL HIGH (ref 11.5–15.5)
WBC Count: 4.2 10*3/uL (ref 4.0–10.5)
nRBC: 0 % (ref 0.0–0.2)

## 2021-02-25 LAB — LACTATE DEHYDROGENASE: LDH: 246 U/L — ABNORMAL HIGH (ref 98–192)

## 2021-02-25 MED ORDER — SODIUM CHLORIDE 0.9 % IV SOLN
400.0000 mg/m2 | Freq: Once | INTRAVENOUS | Status: AC
  Administered 2021-02-25: 900 mg via INTRAVENOUS
  Filled 2021-02-25: qty 45

## 2021-02-25 MED ORDER — SODIUM CHLORIDE 0.9 % IV SOLN
20.0000 mg | Freq: Once | INTRAVENOUS | Status: AC
  Administered 2021-02-25: 20 mg via INTRAVENOUS
  Filled 2021-02-25: qty 20

## 2021-02-25 MED ORDER — BORTEZOMIB CHEMO SQ INJECTION 3.5 MG (2.5MG/ML)
1.3000 mg/m2 | Freq: Once | INTRAMUSCULAR | Status: AC
  Administered 2021-02-25: 3 mg via SUBCUTANEOUS
  Filled 2021-02-25: qty 1.2

## 2021-02-25 MED ORDER — PALONOSETRON HCL INJECTION 0.25 MG/5ML
INTRAVENOUS | Status: AC
  Filled 2021-02-25: qty 5

## 2021-02-25 MED ORDER — PALONOSETRON HCL INJECTION 0.25 MG/5ML
0.2500 mg | Freq: Once | INTRAVENOUS | Status: AC
  Administered 2021-02-25: 0.25 mg via INTRAVENOUS

## 2021-02-25 MED ORDER — SODIUM CHLORIDE 0.9 % IV SOLN
Freq: Once | INTRAVENOUS | Status: AC
Start: 2021-02-25 — End: 2021-02-25
  Filled 2021-02-25: qty 250

## 2021-02-25 NOTE — Patient Instructions (Addendum)
[facility]  Discharge Instructions: °Thank you for choosing [facility] to provide your oncology and hematology care.  ° °If you have a lab appointment with the Cancer Center, please go directly to the Cancer Center and check in at the registration area. °[facility]  Discharge Instructions: °Thank you for choosing [facility] to provide your oncology and hematology care.  ° °If you have a lab appointment with the Cancer Center, please go directly to the Cancer Center and check in at the registration area. ° °Wear comfortable clothing and clothing appropriate for easy access to any Portacath or PICC line.  ° °We strive to give you quality time with your provider. You may need to reschedule your appointment if you arrive late (15 or more minutes).  Arriving late affects you and other patients whose appointments are after yours.  Also, if you miss three or more appointments without notifying the office, you may be dismissed from the clinic at the provider’s discretion.    °  °For prescription refill requests, have your pharmacy contact our office and allow 72 hours for refills to be completed.   ° °Today you received the following chemotherapy and/or immunotherapy agents cytoxan, velcade °    °  °To help prevent nausea and vomiting after your treatment, we encourage you to take your nausea medication as directed. ° °BELOW ARE SYMPTOMS THAT SHOULD BE REPORTED IMMEDIATELY: °*FEVER GREATER THAN 100.4 F (38 °C) OR HIGHER °*CHILLS OR SWEATING °*NAUSEA AND VOMITING THAT IS NOT CONTROLLED WITH YOUR NAUSEA MEDICATION °*UNUSUAL SHORTNESS OF BREATH °*UNUSUAL BRUISING OR BLEEDING °*URINARY PROBLEMS (pain or burning when urinating, or frequent urination) °*BOWEL PROBLEMS (unusual diarrhea, constipation, pain near the anus) °TENDERNESS IN MOUTH AND THROAT WITH OR WITHOUT PRESENCE OF ULCERS (sore throat, sores in mouth, or a toothache) °UNUSUAL  RASH, SWELLING OR PAIN  °UNUSUAL VAGINAL DISCHARGE OR ITCHING  ° °Items with * indicate a potential emergency and should be followed up as soon as possible or go to the Emergency Department if any problems should occur. ° °Please show the CHEMOTHERAPY ALERT CARD or IMMUNOTHERAPY ALERT CARD at check-in to the Emergency Department and triage nurse. °Should you have questions after your visit or need to cancel or reschedule your appointment, please contact [facility]  [phone] and follow the prompts.  Office hours are 8:00 a.m. to 4:30 p.m. Monday - Friday. Please note that voicemails left after 4:00 p.m. may not be returned until the following business day.  We are closed weekends and major holidays. You have access to a nurse at all times for urgent questions. Please call the main number to the clinic [phone] and follow the prompts. ° °For any non-urgent questions, you may also contact your provider using MyChart. We now offer e-Visits for anyone 18 and older to request care online for non-urgent symptoms. For details visit [email or web address]. °  °Also download the MyChart app! Go to the app store, search "MyChart", open the app, select , and log in with your MyChart username and password. ° °Due to Covid, a mask is required upon entering the hospital/clinic. If you do not have a mask, one will be given to you upon arrival. For doctor visits, patients may have 1 support person aged 18 or older with them. For treatment visits, patients cannot have anyone with them due to current Covid guidelines and our immunocompromised population.  °Wear   comfortable clothing and clothing appropriate for easy access to any Portacath or PICC line.  ° °We strive to give you quality time with your provider. You may need to reschedule your appointment if you arrive late (15 or more minutes).  Arriving late affects you and other patients whose appointments are after yours.  Also, if you  miss three or more appointments without notifying the office, you may be dismissed from the clinic at the provider’s discretion.    °  °For prescription refill requests, have your pharmacy contact our office and allow 72 hours for refills to be completed.   ° °Today you received the following chemotherapy and/or immunotherapy agents Cytoxan, Velcade. °  °To help prevent nausea and vomiting after your treatment, we encourage you to take your nausea medication as directed. ° °BELOW ARE SYMPTOMS THAT SHOULD BE REPORTED IMMEDIATELY: °*FEVER GREATER THAN 100.4 F (38 °C) OR HIGHER °*CHILLS OR SWEATING °*NAUSEA AND VOMITING THAT IS NOT CONTROLLED WITH YOUR NAUSEA MEDICATION °*UNUSUAL SHORTNESS OF BREATH °*UNUSUAL BRUISING OR BLEEDING °*URINARY PROBLEMS (pain or burning when urinating, or frequent urination) °*BOWEL PROBLEMS (unusual diarrhea, constipation, pain near the anus) °TENDERNESS IN MOUTH AND THROAT WITH OR WITHOUT PRESENCE OF ULCERS (sore throat, sores in mouth, or a toothache) °UNUSUAL RASH, SWELLING OR PAIN  °UNUSUAL VAGINAL DISCHARGE OR ITCHING  ° °Items with * indicate a potential emergency and should be followed up as soon as possible or go to the Emergency Department if any problems should occur. ° °Please show the CHEMOTHERAPY ALERT CARD or IMMUNOTHERAPY ALERT CARD at check-in to the Emergency Department and triage nurse. °Should you have questions after your visit or need to cancel or reschedule your appointment, please contact [facility]  [phone] and follow the prompts.  Office hours are 8:00 a.m. to 4:30 p.m. Monday - Friday. Please note that voicemails left after 4:00 p.m. may not be returned until the following business day.  We are closed weekends and major holidays. You have access to a nurse at all times for urgent questions. Please call the main number to the clinic [phone] and follow the prompts. ° °For any non-urgent questions, you may also contact your  provider using MyChart. We now offer e-Visits for anyone 18 and older to request care online for non-urgent symptoms. For details visit [email or web address]. °  °Also download the MyChart app! Go to the app store, search "MyChart", open the app, select [facility], and log in with your MyChart username and password. ° °Due to Covid, a mask is required upon entering the hospital/clinic. If you do not have a mask, one will be given to you upon arrival. For doctor visits, patients may have 1 support person aged 18 or older with them. For treatment visits, patients cannot have anyone with them due to current Covid guidelines and our immunocompromised population.  °

## 2021-02-25 NOTE — Progress Notes (Signed)
Reviewed pt labs with Dr. Marin Olp and pt ok to treat with creatinine 2.1

## 2021-02-25 NOTE — Progress Notes (Signed)
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 02/25/2021   Principle Diagnosis:   IgG kappa myeloma  - 1q+  Current Therapy:    CyBorD -- s/p cycle #8 - start on 06/04/2020     Interim History:  Eric [name] is back for follow-up.  The bad news is that his wife is now on dialysis.  She had to go to the hospital because of kidney failure.  She has diabetes.  She is now outpatient.  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  He wants to try to help his wife as much as possible.  He says he does want to collect stem cells.  I told him to speak with Dr. Laverta Baltimore about this.  I am sure that Dr. Laverta Baltimore will be able to arrange for this.  As far as the treatment scope, he is doing well.  He really has had no problems with respect to his treatments.  He is tolerated them well.  They have been working quite nicely to date.  He has had no problems with bowels or bladder.  He has had no bleeding.  He has had a little bit of neuropathy possibly.  There is been no rashes.  He has had no leg swelling.  His last myeloma studies did not show a monoclonal spike in his blood.  His IgG level was 415 mg/dL.  The kappa light chain was 1.4 mg/dL.  Currently, he is still working.  He still operating.  He is an ophthalmologist.  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  Medications:  Current Outpatient Medications:  .  acetaminophen (TYLENOL) 500 MG tablet, Take 1,000 mg by mouth every 4 (four) hours as needed for moderate pain or headache., Disp: , Rfl:  .  amiodarone (PACERONE) 100 MG tablet, TAKE 1 TABLET(100 MG) BY MOUTH DAILY. PLEASE MAKE OVERDUE APPOINTMENT WITH DOCTOR TAYLOR BEFORE ANYMORE REFILLS. THANK YOU FIRST ATTEMPT, Disp: 90 tablet, Rfl: 3 .  apixaban (ELIQUIS) 5 MG TABS tablet, Take 1 tablet (5 mg total) by mouth 2 (two) times daily., Disp: 60 tablet, Rfl: 5 .  carvedilol (COREG) 6.25 MG tablet, TAKE 1 TABLET(6.25 MG) BY MOUTH TWICE DAILY,  Disp: 180 tablet, Rfl: 1 .  docusate sodium (COLACE) 100 MG capsule, Take 100 mg by mouth daily as needed for mild constipation., Disp: , Rfl:  .  famciclovir (FAMVIR) 500 MG tablet, Take 1 tablet (500 mg total) by mouth daily., Disp: 30 tablet, Rfl: 3 .  fluticasone (FLONASE) 50 MCG/ACT nasal spray, Place 2 sprays into both nostrils daily., Disp: 16 g, Rfl: 2 .  furosemide (LASIX) 80 MG tablet, Take 1 tablet (80 mg total) by mouth daily., Disp: 90 tablet, Rfl: 3 .  Hypromellose (ARTIFICIAL TEARS OP), Place 1 drop into both eyes daily as needed (dry eyes)., Disp: , Rfl:  .  ipratropium (ATROVENT) 0.06 % nasal spray, Place 2 sprays into both nostrils 2 (two) times daily., Disp: 15 mL, Rfl: 3 .  isosorbide-hydrALAZINE (BIDIL) 20-37.5 MG tablet, Take 1 tablet by mouth 3 (three) times daily., Disp: 270 tablet, Rfl: 3 .  levothyroxine (SYNTHROID) 75 MCG tablet, TAKE 1 TABLET BY MOUTH DAILY IN THE MORNING EXCEPT TAKE 1/2 TABLET ON SUNDAYS IN THE MORNING, Disp: 90 tablet, Rfl: 0 .  loperamide (IMODIUM) 2 MG capsule, Take 2 mg by mouth as needed for diarrhea or loose stools., Disp: , Rfl:  .  ondansetron (ZOFRAN) 8  MG tablet, Take 1 tablet (8 mg total) by mouth 2 (two) times daily as needed for refractory nausea / vomiting. Start on day 3 after Cytoxan., Disp: 30 tablet, Rfl: 1 .  potassium chloride 20 MEQ/15ML (10%) SOLN, Take 15 mLs (20 mEq total) by mouth 2 (two) times daily. (Patient taking differently: Take 20 mEq by mouth daily.), Disp: 946 mL, Rfl: 6 .  prochlorperazine (COMPAZINE) 10 MG tablet, Take 1 tablet (10 mg total) by mouth every 6 (six) hours as needed (Nausea or vomiting)., Disp: 30 tablet, Rfl: 1 .  rosuvastatin (CRESTOR) 20 MG tablet, Take 1 tablet (20 mg total) by mouth daily., Disp: 90 tablet, Rfl: 2 .  vitamin B-12 (CYANOCOBALAMIN) 100 MCG tablet, Take 100 mcg by mouth daily., Disp: , Rfl:  .  vitamin C (ASCORBIC ACID) 500 MG tablet, Take 500 mg by mouth daily., Disp: , Rfl:  .  VITAMIN  D PO, Take 1 capsule by mouth daily., Disp: , Rfl:   Allergies:  Allergies  Allergen Reactions  . Lactose Intolerance (Gi) Diarrhea    Past Medical History, Surgical history, Social history, and Family History were reviewed and updated.  Review of Systems: Review of Systems  Constitutional: Negative.   HENT:  Negative.   Eyes: Negative.   Respiratory: Negative.   Cardiovascular: Negative.   Gastrointestinal: Negative.   Endocrine: Negative.   Genitourinary: Negative.    Musculoskeletal: Negative.   Skin: Negative.   Neurological: Negative.   Hematological: Negative.   Psychiatric/Behavioral: Negative.     Physical Exam:  weight is 99.2 kg. His oral temperature is 98.2 F (36.8 C). His blood pressure is 103/66 and his pulse is 88. His respiration is 20 and oxygen saturation is 98%.   Wt Readings from Last 3 Encounters:  02/25/21 99.2 kg  01/28/21 101.2 kg  01/13/21 102.4 kg    Physical Exam Vitals reviewed.  HENT:     Head: Normocephalic and atraumatic.  Eyes:     Pupils: Pupils are equal, round, and reactive to light.  Cardiovascular:     Rate and Rhythm: Normal rate and regular rhythm.     Heart sounds: Normal heart sounds.  Pulmonary:     Effort: Pulmonary effort is normal.     Breath sounds: Normal breath sounds.  Abdominal:     General: Bowel sounds are normal.     Palpations: Abdomen is soft.  Musculoskeletal:        General: No tenderness or deformity. Normal range of motion.     Cervical back: Normal range of motion.  Lymphadenopathy:     Cervical: No cervical adenopathy.  Skin:    General: Skin is warm and dry.     Findings: No erythema or rash.  Neurological:     Mental Status: He is alert and oriented to person, place, and time.  Psychiatric:        Behavior: Behavior normal.        Thought Content: Thought content normal.        Judgment: Judgment normal.      Lab Results  Component Value Date   WBC 4.2 02/25/2021   HGB 10.4 (L)  02/25/2021   HCT 31.9 (L) 02/25/2021   MCV 91.1 02/25/2021   PLT 144 (L) 02/25/2021     Chemistry      Component Value Date/Time   NA 140 02/25/2021 0950   NA 146 (H) 07/30/2020 1555   K 3.6 02/25/2021 0950   CL 106 02/25/2021 0950  CO2 26 02/25/2021 0950   BUN 37 (H) 02/25/2021 0950   BUN 28 (H) 07/30/2020 1555   CREATININE 2.12 (H) 02/25/2021 0950   CREATININE 1.95 (H) 09/08/2016 0859      Component Value Date/Time   CALCIUM 8.9 02/25/2021 0950   ALKPHOS 60 02/25/2021 0950   AST 28 02/25/2021 0950   ALT 22 02/25/2021 0950   BILITOT 0.8 02/25/2021 0950      Impression and Plan: Eric [name] is a very nice 70 year old African-American male.  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  He has IgG kappa myeloma.  He has responded very nicely to therapy.  He has seen [facility] transplant doctors at [facility].  He would be a candidate for transplant.  However, there are a lot of issues ongoing right now.  I think that with his wife having dialysis, he really is not good to be able to do any transplant.  For now, we will keep his cycles at every 2 weeks.  This seems to be working well for him.  I would like to plan to see him back in another couple weeks.    Volanda Napoleon, MD 6/3/202210:51 AM

## 2021-02-26 LAB — IGG, IGA, IGM
IgA: 42 mg/dL — ABNORMAL LOW (ref 61–437)
IgG (Immunoglobin G), Serum: 333 mg/dL — ABNORMAL LOW (ref 603–1613)
IgM (Immunoglobulin M), Srm: 9 mg/dL — ABNORMAL LOW (ref 20–172)

## 2021-02-27 LAB — PROTEIN ELECTROPHORESIS, SERUM, WITH REFLEX
A/G Ratio: 1.8 — ABNORMAL HIGH (ref 0.7–1.7)
Albumin ELP: 3.3 g/dL (ref 2.9–4.4)
Alpha-1-Globulin: 0.2 g/dL (ref 0.0–0.4)
Alpha-2-Globulin: 0.5 g/dL (ref 0.4–1.0)
Beta Globulin: 0.8 g/dL (ref 0.7–1.3)
Gamma Globulin: 0.2 g/dL — ABNORMAL LOW (ref 0.4–1.8)
Globulin, Total: 1.8 g/dL — ABNORMAL LOW (ref 2.2–3.9)
Total Protein ELP: 5.1 g/dL — ABNORMAL LOW (ref 6.0–8.5)

## 2021-02-28 LAB — KAPPA/LAMBDA LIGHT CHAINS
Kappa free light chain: 14.6 mg/L (ref 3.3–19.4)
Kappa, lambda light chain ratio: 1.29 (ref 0.26–1.65)
Lambda free light chains: 11.3 mg/L (ref 5.7–26.3)

## 2021-03-02 ENCOUNTER — Other Ambulatory Visit: Payer: Self-pay | Admitting: Internal Medicine

## 2021-03-03 ENCOUNTER — Other Ambulatory Visit: Payer: Self-pay | Admitting: Internal Medicine

## 2021-03-08 ENCOUNTER — Ambulatory Visit: Payer: Medicare Other | Admitting: Podiatry

## 2021-03-08 ENCOUNTER — Ambulatory Visit (INDEPENDENT_AMBULATORY_CARE_PROVIDER_SITE_OTHER): Payer: Medicare Other | Admitting: Podiatry

## 2021-03-08 ENCOUNTER — Encounter: Payer: Self-pay | Admitting: Podiatry

## 2021-03-08 ENCOUNTER — Other Ambulatory Visit: Payer: Self-pay

## 2021-03-08 DIAGNOSIS — B351 Tinea unguium: Secondary | ICD-10-CM | POA: Diagnosis not present

## 2021-03-08 DIAGNOSIS — E119 Type 2 diabetes mellitus without complications: Secondary | ICD-10-CM | POA: Diagnosis not present

## 2021-03-08 DIAGNOSIS — L608 Other nail disorders: Secondary | ICD-10-CM

## 2021-03-08 DIAGNOSIS — D689 Coagulation defect, unspecified: Secondary | ICD-10-CM | POA: Diagnosis not present

## 2021-03-08 DIAGNOSIS — M79675 Pain in left toe(s): Secondary | ICD-10-CM | POA: Diagnosis not present

## 2021-03-08 DIAGNOSIS — M79674 Pain in right toe(s): Secondary | ICD-10-CM | POA: Diagnosis not present

## 2021-03-08 NOTE — Progress Notes (Signed)
This patient returns to my office for at risk foot care.  This patient requires this care by a professional since this patient will be at risk due to having diabetes type 2, chronic kidney disease and coagulation defect.  Patient is taking eliquisThis patient is unable to cut nails himself since the patient cannot reach his nails.These nails are painful walking and wearing shoes.  This patient presents for at risk foot care today.  General Appearance  Alert, conversant and in no acute stress.  Vascular  Dorsalis pedis and posterior tibial  pulses are palpable  bilaterally.  Capillary return is within normal limits  bilaterally. Temperature is within normal limits  bilaterally.  Neurologic  Senn-Weinstein monofilament wire test within normal limits  bilaterally. Muscle power within normal limits bilaterally.  Nails Thick disfigured discolored nails with subungual debris  from hallux to fifth toes bilaterally. No evidence of bacterial infection or drainage bilaterally.  Orthopedic  No limitations of motion  feet .  No crepitus or effusions noted.  No bony pathology or digital deformities noted.  Skin  normotropic skin with no porokeratosis noted bilaterally.  No signs of infections or ulcers noted.     Onychomycosis  Pain in right toes  Pain in left toes  Consent was obtained for treatment procedures.   Mechanical debridement of nails 1-5  bilaterally performed with a nail nipper.  Filed with dremel without incident.    Return office visit   10 weeks                   Told patient to return for periodic foot care and evaluation due to potential at risk complications.   Tyjanae Bartek DPM  

## 2021-03-09 ENCOUNTER — Telehealth: Payer: Self-pay

## 2021-03-11 ENCOUNTER — Inpatient Hospital Stay: Payer: Medicare Other

## 2021-03-11 ENCOUNTER — Ambulatory Visit: Payer: Medicare Other | Admitting: Family

## 2021-03-11 ENCOUNTER — Ambulatory Visit: Payer: Medicare Other

## 2021-03-11 ENCOUNTER — Inpatient Hospital Stay: Payer: Medicare Other | Admitting: Family

## 2021-03-11 ENCOUNTER — Other Ambulatory Visit: Payer: Medicare Other

## 2021-03-15 ENCOUNTER — Ambulatory Visit (INDEPENDENT_AMBULATORY_CARE_PROVIDER_SITE_OTHER): Payer: Medicare Other

## 2021-03-15 DIAGNOSIS — I428 Other cardiomyopathies: Secondary | ICD-10-CM

## 2021-03-16 LAB — CUP PACEART REMOTE DEVICE CHECK
Battery Remaining Longevity: 29 mo
Battery Remaining Percentage: 35 %
Battery Voltage: 2.9 V
Date Time Interrogation Session: 20220622033907
HighPow Impedance: 57 Ohm
HighPow Impedance: 57 Ohm
Implantable Lead Implant Date: 20170525
Implantable Lead Implant Date: 20170525
Implantable Lead Implant Date: 20170525
Implantable Lead Location: 753858
Implantable Lead Location: 753859
Implantable Lead Location: 753860
Implantable Lead Model: 7122
Implantable Pulse Generator Implant Date: 20170525
Lead Channel Impedance Value: 380 Ohm
Lead Channel Impedance Value: 430 Ohm
Lead Channel Impedance Value: 630 Ohm
Lead Channel Pacing Threshold Amplitude: 0.5 V
Lead Channel Pacing Threshold Amplitude: 1.25 V
Lead Channel Pacing Threshold Pulse Width: 0.5 ms
Lead Channel Pacing Threshold Pulse Width: 0.5 ms
Lead Channel Sensing Intrinsic Amplitude: 0.2 mV
Lead Channel Sensing Intrinsic Amplitude: 11.4 mV
Lead Channel Setting Pacing Amplitude: 2 V
Lead Channel Setting Pacing Amplitude: 2.5 V
Lead Channel Setting Pacing Pulse Width: 0.5 ms
Lead Channel Setting Pacing Pulse Width: 0.5 ms
Lead Channel Setting Sensing Sensitivity: 0.5 mV
Pulse Gen Serial Number: 7357926

## 2021-03-22 ENCOUNTER — Emergency Department (HOSPITAL_BASED_OUTPATIENT_CLINIC_OR_DEPARTMENT_OTHER)
Admission: EM | Admit: 2021-03-22 | Discharge: 2021-03-22 | Disposition: A | Discharge status: Home / Self Care | Payer: Medicare Other | Attending: Emergency Medicine | Admitting: Emergency Medicine

## 2021-03-22 ENCOUNTER — Encounter (HOSPITAL_BASED_OUTPATIENT_CLINIC_OR_DEPARTMENT_OTHER): Payer: Self-pay | Admitting: Obstetrics and Gynecology

## 2021-03-22 ENCOUNTER — Emergency Department (HOSPITAL_BASED_OUTPATIENT_CLINIC_OR_DEPARTMENT_OTHER): Payer: Medicare Other

## 2021-03-22 ENCOUNTER — Other Ambulatory Visit: Payer: Self-pay

## 2021-03-22 DIAGNOSIS — M25571 Pain in right ankle and joints of right foot: Secondary | ICD-10-CM | POA: Diagnosis not present

## 2021-03-22 DIAGNOSIS — I5023 Acute on chronic systolic (congestive) heart failure: Secondary | ICD-10-CM | POA: Insufficient documentation

## 2021-03-22 DIAGNOSIS — N184 Chronic kidney disease, stage 4 (severe): Secondary | ICD-10-CM | POA: Insufficient documentation

## 2021-03-22 DIAGNOSIS — E039 Hypothyroidism, unspecified: Secondary | ICD-10-CM | POA: Insufficient documentation

## 2021-03-22 DIAGNOSIS — I2510 Atherosclerotic heart disease of native coronary artery without angina pectoris: Secondary | ICD-10-CM | POA: Diagnosis not present

## 2021-03-22 DIAGNOSIS — M109 Gout, unspecified: Secondary | ICD-10-CM | POA: Diagnosis not present

## 2021-03-22 DIAGNOSIS — E1122 Type 2 diabetes mellitus with diabetic chronic kidney disease: Secondary | ICD-10-CM | POA: Diagnosis not present

## 2021-03-22 DIAGNOSIS — Z7901 Long term (current) use of anticoagulants: Secondary | ICD-10-CM | POA: Diagnosis not present

## 2021-03-22 DIAGNOSIS — M7989 Other specified soft tissue disorders: Secondary | ICD-10-CM | POA: Insufficient documentation

## 2021-03-22 DIAGNOSIS — I4891 Unspecified atrial fibrillation: Secondary | ICD-10-CM | POA: Insufficient documentation

## 2021-03-22 DIAGNOSIS — I130 Hypertensive heart and chronic kidney disease with heart failure and stage 1 through stage 4 chronic kidney disease, or unspecified chronic kidney disease: Secondary | ICD-10-CM | POA: Insufficient documentation

## 2021-03-22 DIAGNOSIS — M79671 Pain in right foot: Secondary | ICD-10-CM | POA: Diagnosis not present

## 2021-03-22 DIAGNOSIS — Z79899 Other long term (current) drug therapy: Secondary | ICD-10-CM | POA: Diagnosis not present

## 2021-03-22 MED ORDER — PREDNISONE 50 MG PO TABS
60.0000 mg | ORAL_TABLET | Freq: Once | ORAL | Status: AC
  Administered 2021-03-22: 60 mg via ORAL
  Filled 2021-03-22: qty 1

## 2021-03-22 MED ORDER — PREDNISONE 10 MG PO TABS: 40.0000 mg | ORAL_TABLET | Freq: Every day | ORAL | 0 refills | Status: DC

## 2021-03-22 NOTE — ED Triage Notes (Signed)
Patient reports right foot pain. Patient reports a hx of gout but reports this feels different. Patient denies injury but reports more walking than normal which may be contributing to the foot pain.

## 2021-03-22 NOTE — ED Provider Notes (Signed)
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  Patient has a history of group gout.  But has not had a flare in several years.  Patient with complaint of right foot pain.  More to the little toe side of the foot.  Has some swelling there and some redness.  Patient denies any injury but has been walking more than usual.  This seems to be increasing the pain.  Patient's past medical history significant for acute kidney injury atrial flutter is on a blood thinner.  Congestive heart failure.  Borderline diabetes.  Patient without any chest pain or any shortness of breath.      Past Medical History:  Diagnosis Date   Acute blood loss anemia    Acute encephalopathy    Acute hypoxemic respiratory failure (HCC)    Acute idiopathic gout of right ankle    Acute lumbar back pain    Acute on chronic systolic heart failure (HCC)    Acute respiratory failure (HCC)    AKI (acute kidney injury) ([place])    Altered mental status    Aortic aneurysm without rupture ([place]) 02/09/2016   Aortic root enlargement ([place]) 02/13/2012   Aortic valve regurgitation, acquired 02/13/2012   Arrhythmia    Atrial flutter ([place]) 03/18/2012   Back pain    Cardiac arrest ([place]) 02/01/2016   CHF (congestive heart failure) ([place])    Chronic anticoagulation 03/04/2013   Chronic kidney disease    kidney fx studies increased    Chronic lower back pain    Chronic renal insufficiency    Chronic systolic heart failure ([place]) 02/13/2012   Recent diagnosis 4 / 2013, LVEF 25% by Echo 12/2011  03/2013: Echo at [facility] Conclusions: 1. Left ventricular ejection fraction estimated by 2D at 40-45 percent. 2. Mild concentric left ventricular hypertrophy. 3. Mild left atrial enlargement. 4. Moderate aortic valve regurgitation. 5. The aortic root at the sinus(es) of  valsalva is moderately dilated 6. Mild mitral valve regurgitation. 7.   CKD (chronic kidney disease)    CKD (chronic kidney disease), stage IV ([place]) 08/06/2013   Creatinine 2.4 on 07/04/13    Claustrophobia    Colon cancer screening 03/04/2013   Debility 02/22/2016   Diabetes mellitus type 2 in nonobese [facility])    Diabetes mellitus type 2 in obese [facility])    Dysrhythmia    "palpitations"   Encounter for central line placement    Exertional dyspnea 01/2012   Femoral nerve injury 02/22/2016   Femoral neuropathy    Goals of care, counseling/discussion 05/05/2020   HCAP (healthcare-associated pneumonia)    Heart murmur    Hyperlipidemia 02/13/2012   Hypertension    Hypothyroidism    Internal hemorrhoids without mention of complication 1/60/1093   Labile blood pressure    Left bundle branch block 02/13/2012   Leg weakness, bilateral    Long term (current) use of anticoagulants 08/06/2013   Eliquis therapy    Long term current use of amiodarone 08/07/2016   Lower extremity weakness    Migraine 02/13/12   "opthalmic"   Multiple myeloma ([place]) 05/05/2020   Non-traumatic rhabdomyolysis    Obesity (BMI 30-39.9) 02/13/2012   Pain    Paroxysmal atrial fibrillation (HCC)    Pneumonia    Retroperitoneal bleed    Right ankle  pain    Right knee pain    Severe aortic regurgitation 02/09/2016   Special screening for malignant neoplasms, colon 04/11/2013   Thyroid activity decreased    Tibial pain    Varicose vein of leg    right   Ventricular fibrillation ([place]) 02/09/2016   Weakness of both lower extremities     Patient Active Problem List   Diagnosis Date Noted   Goals of care, counseling/discussion 05/05/2020   Multiple myeloma ([place]) 05/05/2020   S/P flap graft 01/07/2019   Coronary artery disease 01/07/2019   Long term (current) use of antibiotics 01/03/2019   Essential hypertension 12/27/2018   Postoperative infection of wound of sternum 12/27/2018   Decreased strength, endurance, and  mobility 12/27/2018   Cardiac LV ejection fraction 30-35% 12/27/2018   Decreased activities of daily living (ADL) 12/27/2018   Klebsiella infection 12/27/2018   S/P AVR (aortic valve replacement) 11/23/2018   S/P ascending aortic aneurysm repair 11/23/2018   NICM (nonischemic cardiomyopathy) ([place]) 11/19/2018   AICD (automatic cardioverter/defibrillator) present 11/19/2018   Other symptoms and signs involving the musculoskeletal system 05/08/2018   Long term current use of amiodarone 08/07/2016   Diabetes mellitus type 2 in nonobese [facility])    Femoral neuropathy    Leg weakness, bilateral    Paroxysmal atrial fibrillation (HCC)    Ventricular fibrillation ([place]) 02/09/2016   Cardiac arrest ([place]) 02/01/2016   Thyroid activity decreased    Arrhythmia    CKD (chronic kidney disease), stage IV ([place]) 08/06/2013    Class: Chronic   Long term current use of anticoagulant 03/04/2013   Atrial flutter ([place]) 03/18/2012    Class: Acute   Chronic systolic heart failure ([place]) 02/13/2012    Class: Acute   Hypertension, accelerated 02/13/2012   Hyperlipidemia 02/13/2012   Left bundle branch block 02/13/2012   Obesity (BMI 30-39.9) 02/13/2012    Past Surgical History:  Procedure Laterality Date   AORTIC VALVE REPLACEMENT  11/20/2018   CARDIAC CATHETERIZATION N/A 02/17/2016   Procedure: Left Heart Cath and Coronary Angiography;  Surgeon: Jettie Booze, MD;  Location: [place] CV LAB;  Service: Cardiovascular;  Laterality: N/A;   CARDIOVERSION  03/22/2012   Procedure: CARDIOVERSION;  Surgeon: Candee Furbish, MD;  Location: [facility] ENDOSCOPY;  Service: Cardiovascular;  Laterality: N/A;   CARDIOVERSION  04/19/2012   Procedure: CARDIOVERSION;  Surgeon: Sinclair Grooms, MD;  Location: [place];  Service: Cardiovascular;  Laterality: N/A;   COLONOSCOPY N/A 04/11/2013   Procedure: COLONOSCOPY;  Surgeon: Inda Castle, MD;  Location: [facility] ENDOSCOPY;  Service: Endoscopy;  Laterality: N/A;   COLONOSCOPY N/A  04/11/2013   Procedure: COLONOSCOPY;  Surgeon: Inda Castle, MD;  Location: [facility] ENDOSCOPY;  Service: Endoscopy;  Laterality: N/A;   EP IMPLANTABLE DEVICE N/A 02/17/2016   Procedure: BiV ICD Insertion CRT-D;  Surgeon: Evans Lance, MD;  Location: [place] CV LAB;  Service: Cardiovascular;  Laterality: N/A;   FINGER SURGERY  2012   "4th digit right hand; thumb on left hand"   RADIOLOGY WITH ANESTHESIA N/A 02/11/2016   Procedure: MRI OF THE BRAIN WITHOUT CONTRAST, LUMBAR WITHOUT CONTRAST;  Surgeon: Medication Radiologist, MD;  Location: [facility];  Service: Radiology;  Laterality: N/A;  DR. WOOD/MRI   RIGHT/LEFT HEART CATH AND CORONARY ANGIOGRAPHY N/A 07/26/2018   Procedure: RIGHT/LEFT HEART CATH AND CORONARY ANGIOGRAPHY;  Surgeon: Belva Crome, MD;  Location: [place] CV LAB;  Service: Cardiovascular;  Laterality: N/A;   Skin melanocytoma excision  2012   "  above left clavicle"   STERNAL INCISION RECLOSURE  11/2018   STERNAL WIRE REMOVAL  11/2018   STERNAL WOUND DEBRIDEMENT  11/2018   TEE WITHOUT CARDIOVERSION  03/22/2012   Procedure: TRANSESOPHAGEAL ECHOCARDIOGRAM (TEE);  Surgeon: Candee Furbish, MD;  Location: [facility] ENDOSCOPY;  Service: Cardiovascular;  Laterality: N/A;   TEE WITHOUT CARDIOVERSION N/A 02/08/2016   Procedure: TRANSESOPHAGEAL ECHOCARDIOGRAM (TEE);  Surgeon: Lelon Perla, MD;  Location: [facility] ENDOSCOPY;  Service: Cardiovascular;  Laterality: N/A;       Family History  Problem Relation Age of Onset   Hypertension Mother    Heart disease Mother    Heart failure Mother    Diabetes Mother    Hypertension Father    Heart disease Father    Heart failure Father     Social History   Tobacco Use   Smoking status: Never   Smokeless tobacco: Never  Vaping Use   Vaping Use: Never used  Substance Use Topics   Alcohol use: Yes    Comment: rarely   Drug use: No    Home Medications Prior to Admission medications   Medication Sig Start Date End Date Taking? Authorizing  Provider  acetaminophen (TYLENOL) 500 MG tablet Take 1,000 mg by mouth every 4 (four) hours as needed for moderate pain or headache.    [provider]  amiodarone (PACERONE) 100 MG tablet TAKE 1 TABLET(100 MG) BY MOUTH DAILY. PLEASE MAKE OVERDUE APPOINTMENT WITH DOCTOR TAYLOR BEFORE ANYMORE REFILLS. [place] YOU FIRST ATTEMPT 10/12/20   Evans Lance, MD  apixaban (ELIQUIS) 5 MG TABS tablet Take 1 tablet (5 mg total) by mouth 2 (two) times daily. 08/26/20   Glendale Chard, MD  carvedilol (COREG) 6.25 MG tablet TAKE 1 TABLET(6.25 MG) BY MOUTH TWICE DAILY 03/03/21   [place]ale Chard, MD  docusate sodium (COLACE) 100 MG capsule Take 100 mg by mouth daily as needed for mild constipation.    [provider]  famciclovir (FAMVIR) 500 MG tablet Take 1 tablet (500 mg total) by mouth daily. 12/03/20   Volanda Napoleon, MD  fluticasone (FLONASE) 50 MCG/ACT nasal spray Place 2 sprays into both nostrils daily. 01/13/21   Glendale Chard, MD  furosemide (LASIX) 80 MG tablet Take 1 tablet (80 mg total) by mouth daily. 07/23/20   Belva Crome, MD  Hypromellose (ARTIFICIAL TEARS OP) Place 1 drop into both eyes daily as needed (dry eyes).    [provider]  ipratropium (ATROVENT) 0.06 % nasal spray Place 2 sprays into both nostrils 2 (two) times daily. 05/13/19   Glendale Chard, MD  isosorbide-hydrALAZINE (BIDIL) 20-37.5 MG tablet Take 1 tablet by mouth 3 (three) times daily. 04/22/20   Glendale Chard, MD  levothyroxine (SYNTHROID) 75 MCG tablet TAKE 1 TABLET BY MOUTH DAILY IN THE MORNING. EXCEPT TAKE 1/2 TABLET ON SUNDAYS IN THE MORNING 03/02/21   Glendale Chard, MD  loperamide (IMODIUM) 2 MG capsule Take 2 mg by mouth as needed for diarrhea or loose stools.    [provider]  ondansetron (ZOFRAN) 8 MG tablet Take 1 tablet (8 mg total) by mouth 2 (two) times daily as needed for refractory nausea / vomiting. Start on day 3 after Cytoxan. 06/01/20   Volanda Napoleon, MD  potassium chloride 20  MEQ/15ML (10%) SOLN Take 15 mLs (20 mEq total) by mouth 2 (two) times daily. Patient taking differently: Take 20 mEq by mouth daily. 07/23/20   Belva Crome, MD  prochlorperazine (COMPAZINE) 10 MG tablet Take 1 tablet (  10 mg total) by mouth every 6 (six) hours as needed (Nausea or vomiting). 06/01/20   Volanda Napoleon, MD  rosuvastatin (CRESTOR) 20 MG tablet Take 1 tablet (20 mg total) by mouth daily. 08/26/20   Glendale Chard, MD  vitamin B-12 (CYANOCOBALAMIN) 100 MCG tablet Take 100 mcg by mouth daily.    [provider]  vitamin C (ASCORBIC ACID) 500 MG tablet Take 500 mg by mouth daily.    [provider]  VITAMIN D PO Take 1 capsule by mouth daily.    [provider]    Allergies    Lactose intolerance (gi)  Review of Systems   Review of Systems  Constitutional:  Negative for chills and fever.  HENT:  Negative for ear pain and sore throat.   Eyes:  Negative for pain and visual disturbance.  Respiratory:  Negative for cough and shortness of breath.   Cardiovascular:  Negative for chest pain and palpitations.  Gastrointestinal:  Negative for abdominal pain and vomiting.  Genitourinary:  Negative for dysuria and hematuria.  Musculoskeletal:  Positive for joint swelling. Negative for arthralgias and back pain.  Skin:  Negative for color change and rash.  Neurological:  Negative for seizures and syncope.  All other systems reviewed and are negative.  Physical Exam Updated Vital Signs BP (!) 154/91 (BP Location: Left Arm)   Pulse 71   Temp 98.3 F (36.8 C)   Resp 14   Ht 1.727 m ('5\' 8"' )   Wt 99.8 kg   SpO2 98%   BMI 33.45 kg/m   Physical Exam Vitals and nursing note reviewed.  Constitutional:      Appearance: He is well-developed.  HENT:     Head: Normocephalic and atraumatic.  Eyes:     Conjunctiva/sclera: Conjunctivae normal.  Cardiovascular:     Rate and Rhythm: Normal rate and regular rhythm.     Heart sounds: No murmur heard. Pulmonary:      Effort: Pulmonary effort is normal. No respiratory distress.     Breath sounds: Normal breath sounds.  Abdominal:     Palpations: Abdomen is soft.     Tenderness: There is no abdominal tenderness.  Musculoskeletal:        General: Swelling and tenderness present. No deformity.     Cervical back: Neck supple.     Comments: Right lateral aspect of the foot around the fifth metatarsal area with an area of swelling and tenderness measuring about 3 x 5 cm.  There is some swelling to the foot in general.  No calf tenderness.  No proximal leg swelling  Skin:    General: Skin is warm and dry.     Capillary Refill: Capillary refill takes 2 to 3 seconds.  Neurological:     General: No focal deficit present.     Mental Status: He is alert and oriented to person, place, and time.    ED Results / Procedures / Treatments   Labs (all labs ordered are listed, but only abnormal results are displayed) Labs Reviewed - No data to display  EKG None  Radiology No results found.  Procedures Procedures   Medications Ordered in ED Medications - No data to display  ED Course  I have reviewed the triage vital signs and the nursing notes.  Pertinent labs & imaging results that were available during my care of the patient were reviewed by me and considered in my medical decision making (see chart for details).    MDM Rules/Calculators/A&P  The redness tenderness and swelling over the fifth metatarsal area suggestive of gouty arthritis flare.  But x-rays are negative for any bony abnormality.  We will treat as such.  We will have patient follow-up with his doctor.  A course of prednisone for the next 5 days.  He will return for any new or worse symptoms.  Patient seems did not have any significant blood flow decreased.  Does have a little bit of swelling to the foot and a little bit of swelling at the ankle.  No calf swelling no calf tenderness do not think that there is a  deep vein thrombosis.  Certainly when explained the redness and the localized swelling.  And tenderness Final Clinical Impression(s) / ED Diagnoses Final diagnoses:  None    Rx / DC Orders ED Discharge Orders     None        Fredia Sorrow, MD 03/22/21 2002

## 2021-03-22 NOTE — Discharge Instructions (Addendum)
Can appointment follow-up with your regular doctor.  X-rays of the foot and ankle without any bony abnormalities.  Symptoms seem to be consistent with gouty arthritis of the right foot.  Take the prednisone as directed for the next 5 days.  Return for any new or worse symptoms.

## 2021-03-22 NOTE — ED Notes (Signed)
Pt was taken to xray from triage, waiting for pt to come to room.

## 2021-03-23 ENCOUNTER — Telehealth: Payer: Self-pay

## 2021-03-23 NOTE — Telephone Encounter (Signed)
Transition Care Management Follow-up Telephone Call Date of discharge and from where: 03/22/2021   How have you been since you were released from the hospital? Pt states he is doing better.  Any questions or concerns? No  Items Reviewed: Did the pt receive and understand the discharge instructions provided? Yes  Medications obtained and verified? Yes  Other? Yes  Any new allergies since your discharge? No  Dietary orders reviewed? Yes Do you have support at home? Yes   Home Care and Equipment/Supplies: Were home health services ordered? not applicable If so, what is the name of the agency? N/a   Has the agency set up a time to come to the patient's home? not applicable Were any new equipment or medical supplies ordered?  No What is the name of the medical supply agency? N/a  Were you able to get the supplies/equipment? not applicable Do you have any questions related to the use of the equipment or supplies? No  Functional Questionnaire: (I = Independent and D = Dependent) ADLs: I  Bathing/Dressing- I  Meal Prep- I  Eating- I  Maintaining continence- I  Transferring/Ambulation- I  Managing Meds- I  Follow up appointments reviewed:  PCP Hospital f/u appt confirmed? Yes  Scheduled to see Raman Ghumman on 03/31/2021 @ 4:00pm [facility] f/u appt confirmed? No  Scheduled to see n/a on n/a @ n/a. Are transportation arrangements needed? No  If their condition worsens, is the pt aware to call PCP or go to the Emergency Dept.? Yes Was the patient provided with contact information for the PCP's office or ED? Yes Was to pt encouraged to call back with questions or concerns? Yes

## 2021-03-31 ENCOUNTER — Other Ambulatory Visit: Payer: Self-pay

## 2021-03-31 ENCOUNTER — Ambulatory Visit (INDEPENDENT_AMBULATORY_CARE_PROVIDER_SITE_OTHER): Payer: Medicare Other | Admitting: Nurse Practitioner

## 2021-03-31 DIAGNOSIS — I130 Hypertensive heart and chronic kidney disease with heart failure and stage 1 through stage 4 chronic kidney disease, or unspecified chronic kidney disease: Secondary | ICD-10-CM | POA: Diagnosis not present

## 2021-03-31 DIAGNOSIS — M109 Gout, unspecified: Secondary | ICD-10-CM | POA: Diagnosis not present

## 2021-03-31 LAB — POCT URINALYSIS DIPSTICK
Bilirubin, UA: NEGATIVE
Glucose, UA: NEGATIVE
Ketones, UA: NEGATIVE
Leukocytes, UA: NEGATIVE
Nitrite, UA: NEGATIVE
Protein, UA: POSITIVE — AB
Spec Grav, UA: >=1.03 — AB (ref 1.010–1.025)
Urobilinogen, UA: 0.2 E.U./dL
pH, UA: 6 (ref 5.0–8.0)

## 2021-03-31 LAB — POCT UA - MICROALBUMIN
Creatinine, POC: 300 mg/dL
Microalbumin Ur, POC: 150 mg/L

## 2021-03-31 NOTE — Progress Notes (Signed)
I,Tianna Badgett,acting as a Education administrator for [employer], NP.,have documented all relevant documentation on the behalf of [employer], NP,as directed by  Bary Castilla, NP while in the presence of Bary Castilla, NP.  This visit occurred during the SARS-CoV-2 public health emergency.  Safety protocols were in place, including screening questions prior to the visit, additional usage of staff PPE, and extensive cleaning of exam room while observing appropriate contact time as indicated for disinfecting solutions.  Subjective:     Patient ID: Eric [name]., MD , male    DOB: 1951-01-15 , 70 y.o.   MRN: [id number]   Chief Complaint  Patient presents with   Follow-up   HPI  Patient is here for ED follow up. He was seen for gout. They did not take any blood work. But gave him prednisone to take. He also sees a nephrologist. He also sees a cancer specialist for his multiple myeloma.  Will do some blood work today.      Past Medical History:  Diagnosis Date   Acute blood loss anemia    Acute encephalopathy    Acute hypoxemic respiratory failure (HCC)    Acute idiopathic gout of right ankle    Acute lumbar back pain    Acute on chronic systolic heart failure (HCC)    Acute respiratory failure (HCC)    AKI (acute kidney injury) ([place])    Altered mental status    Aortic aneurysm without rupture ([place]) 02/09/2016   Aortic root enlargement ([place]) 02/13/2012   Aortic valve regurgitation, acquired 02/13/2012   Arrhythmia    Atrial flutter ([place]) 03/18/2012   Back pain    Cardiac arrest ([place]) 02/01/2016   CHF (congestive heart failure) ([place])    Chronic anticoagulation 03/04/2013   Chronic kidney disease    kidney fx studies increased    Chronic lower back pain    Chronic renal insufficiency    Chronic systolic heart failure ([place]) 02/13/2012   Recent diagnosis 4 / 2013, LVEF 25% by Echo 12/2011  03/2013: Echo at [facility] Conclusions: 1. Left ventricular ejection fraction  estimated by 2D at 40-45 percent. 2. Mild concentric left ventricular hypertrophy. 3. Mild left atrial enlargement. 4. Moderate aortic valve regurgitation. 5. The aortic root at the sinus(es) of valsalva is moderately dilated 6. Mild mitral valve regurgitation. 7.   CKD (chronic kidney disease)    CKD (chronic kidney disease), stage IV ([place]) 08/06/2013   Creatinine 2.4 on 07/04/13    Claustrophobia    Colon cancer screening 03/04/2013   Debility 02/22/2016   Diabetes mellitus type 2 in nonobese [facility])    Diabetes mellitus type 2 in obese [facility])    Dysrhythmia    "palpitations"   Encounter for central line placement    Exertional dyspnea 01/2012   Femoral nerve injury 02/22/2016   Femoral neuropathy    Goals of care, counseling/discussion 05/05/2020   HCAP (healthcare-associated pneumonia)    Heart murmur    Hyperlipidemia 02/13/2012   Hypertension    Hypothyroidism    Internal hemorrhoids without mention of complication 2/35/5732   Labile blood pressure    Left bundle branch block 02/13/2012   Leg weakness, bilateral    Long term (current) use of anticoagulants 08/06/2013   Eliquis therapy    Long term current use of amiodarone 08/07/2016   Lower extremity weakness    Migraine 02/13/12   "opthalmic"   Multiple myeloma ([place]) 05/05/2020   Non-traumatic rhabdomyolysis    Obesity (BMI 30-39.9) 02/13/2012  Pain    Paroxysmal atrial fibrillation (HCC)    Pneumonia    Retroperitoneal bleed    Right ankle pain    Right knee pain    Severe aortic regurgitation 02/09/2016   Special screening for malignant neoplasms, colon 04/11/2013   Thyroid activity decreased    Tibial pain    Varicose vein of leg    right   Ventricular fibrillation ([place]) 02/09/2016   Weakness of both lower extremities      Family History  Problem Relation Age of Onset   Hypertension Mother    Heart disease Mother    Heart failure Mother    Diabetes Mother    Hypertension Father    Heart disease Father    Heart  failure Father      Current Outpatient Medications:    acetaminophen (TYLENOL) 500 MG tablet, Take 1,000 mg by mouth every 4 (four) hours as needed for moderate pain or headache., Disp: , Rfl:    amiodarone (PACERONE) 100 MG tablet, TAKE 1 TABLET(100 MG) BY MOUTH DAILY. PLEASE MAKE OVERDUE APPOINTMENT WITH DOCTOR TAYLOR BEFORE ANYMORE REFILLS. THANK YOU FIRST ATTEMPT, Disp: 90 tablet, Rfl: 3   apixaban (ELIQUIS) 5 MG TABS tablet, Take 1 tablet (5 mg total) by mouth 2 (two) times daily., Disp: 60 tablet, Rfl: 5   carvedilol (COREG) 6.25 MG tablet, TAKE 1 TABLET(6.25 MG) BY MOUTH TWICE DAILY, Disp: 180 tablet, Rfl: 1   docusate sodium (COLACE) 100 MG capsule, Take 100 mg by mouth daily as needed for mild constipation., Disp: , Rfl:    famciclovir (FAMVIR) 500 MG tablet, Take 1 tablet (500 mg total) by mouth daily., Disp: 30 tablet, Rfl: 3   fluticasone (FLONASE) 50 MCG/ACT nasal spray, Place 2 sprays into both nostrils daily., Disp: 16 g, Rfl: 2   furosemide (LASIX) 80 MG tablet, Take 1 tablet (80 mg total) by mouth daily., Disp: 90 tablet, Rfl: 3   Hypromellose (ARTIFICIAL TEARS OP), Place 1 drop into both eyes daily as needed (dry eyes)., Disp: , Rfl:    ipratropium (ATROVENT) 0.06 % nasal spray, Place 2 sprays into both nostrils 2 (two) times daily., Disp: 15 mL, Rfl: 3   isosorbide-hydrALAZINE (BIDIL) 20-37.5 MG tablet, Take 1 tablet by mouth 3 (three) times daily., Disp: 270 tablet, Rfl: 3   levothyroxine (SYNTHROID) 75 MCG tablet, TAKE 1 TABLET BY MOUTH DAILY IN THE MORNING. EXCEPT TAKE 1/2 TABLET ON SUNDAYS IN THE MORNING, Disp: 90 tablet, Rfl: 0   loperamide (IMODIUM) 2 MG capsule, Take 2 mg by mouth as needed for diarrhea or loose stools., Disp: , Rfl:    ondansetron (ZOFRAN) 8 MG tablet, Take 1 tablet (8 mg total) by mouth 2 (two) times daily as needed for refractory nausea / vomiting. Start on day 3 after Cytoxan., Disp: 30 tablet, Rfl: 1   potassium chloride 20 MEQ/15ML (10%) SOLN, Take  15 mLs (20 mEq total) by mouth 2 (two) times daily. (Patient taking differently: Take 20 mEq by mouth daily.), Disp: 946 mL, Rfl: 6   predniSONE (DELTASONE) 10 MG tablet, Take 4 tablets (40 mg total) by mouth daily., Disp: 20 tablet, Rfl: 0   prochlorperazine (COMPAZINE) 10 MG tablet, Take 1 tablet (10 mg total) by mouth every 6 (six) hours as needed (Nausea or vomiting)., Disp: 30 tablet, Rfl: 1   rosuvastatin (CRESTOR) 20 MG tablet, Take 1 tablet (20 mg total) by mouth daily., Disp: 90 tablet, Rfl: 2   vitamin B-12 (CYANOCOBALAMIN) 100 MCG tablet, Take  100 mcg by mouth daily., Disp: , Rfl:    vitamin C (ASCORBIC ACID) 500 MG tablet, Take 500 mg by mouth daily., Disp: , Rfl:    VITAMIN D PO, Take 1 capsule by mouth daily., Disp: , Rfl:    Allergies  Allergen Reactions   Lactose Intolerance (Gi) Diarrhea     Review of Systems  Constitutional: Negative.  Negative for chills, fever and unexpected weight change.  HENT: Negative.  Negative for congestion, rhinorrhea and sinus pain.   Eyes: Negative.   Respiratory: Negative.  Negative for cough, shortness of breath and wheezing.   Cardiovascular: Negative.  Negative for chest pain and palpitations.  Gastrointestinal: Negative.  Negative for constipation and diarrhea.  Endocrine: Negative.   Genitourinary: Negative.   Musculoskeletal: Negative.  Negative for arthralgias and myalgias.       Pain on upper right foot. But has subsided.   Skin: Negative.   Allergic/Immunologic: Negative.   Neurological: Negative.  Negative for weakness, numbness and headaches.  Hematological: Negative.   Psychiatric/Behavioral: Negative.      There were no vitals filed for this visit. There is no height or weight on file to calculate BMI.   Objective:  Physical Exam Constitutional:      Appearance: Normal appearance. He is obese.  HENT:     Head: Normocephalic and atraumatic.  Cardiovascular:     Rate and Rhythm: Normal rate and regular rhythm.      Pulses: Normal pulses.     Heart sounds: Normal heart sounds. No murmur heard. Pulmonary:     Effort: Pulmonary effort is normal. No respiratory distress.     Breath sounds: Normal breath sounds. No wheezing.  Musculoskeletal:        General: No swelling or tenderness. Normal range of motion.     Right lower leg: No edema.     Left lower leg: No edema.     Right foot: Normal. No swelling.     Left foot: Normal. No swelling.  Skin:    General: Skin is warm and dry.     Capillary Refill: Capillary refill takes less than 2 seconds.  Neurological:     Mental Status: He is alert and oriented to person, place, and time.        Assessment And Plan:     1. Gout, unspecified cause, unspecified chronicity, unspecified site - Uric acid - CBC no Diff - CMP14+EGFR -Has hx of gout; will check uric acid. And also check kidney functions.   2. Hypertensive heart and renal disease with renal failure, stage 1 through stage 4 or unspecified chronic kidney disease, with heart failure (HCC) - POCT Urinalysis Dipstick (16109) - POCT UA - Microalbumin  -Patient will make an appointment with nephrologist -CMP14+EGFR    The patient was encouraged to call or send a message through [place] for any questions or concerns.   Follow up: if symptoms persist or do not get better.   Patient was given opportunity to ask questions. Patient verbalized understanding of the plan and was able to repeat key elements of the plan. All questions were answered to their satisfaction.  Raman Gabryel Files, DNP   I, Raman Lavelle Berland have reviewed all documentation for this visit. The documentation on 03/31/21 for the exam, diagnosis, procedures, and orders are all accurate and complete.   IF YOU HAVE BEEN REFERRED TO A SPECIALIST, IT MAY TAKE 1-2 WEEKS TO SCHEDULE/PROCESS THE REFERRAL. IF YOU HAVE NOT HEARD FROM US/SPECIALIST IN TWO WEEKS, PLEASE GIVE  [place] A CALL AT [phone].   THE PATIENT IS ENCOURAGED TO PRACTICE SOCIAL  DISTANCING DUE TO THE COVID-19 PANDEMIC.

## 2021-03-31 NOTE — Patient Instructions (Signed)
Gout Gout is painful swelling of your joints. Gout is a type of arthritis. It is caused by having too much uric acid in your body. Uric acid is a chemical that is made when your body breaks down substances called purines. If your body has too much uric acid, sharp crystals can form and build up in your joints. This causes pain and swelling. Gout attacks can happen quickly and be very painful (acute gout). Over time, the attacks can affect more joints and happen more often (chronic gout). What are the causes? Too much uric acid in your blood. This can happen because: Your kidneys do not remove enough uric acid from your blood. Your body makes too much uric acid. You eat too many foods that are high in purines. These foods include organ meats, some seafood, and beer. Trauma or stress. What increases the risk? Having a family history of gout. Being male and middle-aged. Being male and having gone through menopause. Being very overweight (obese). Drinking alcohol, especially beer. Not having enough water in the body (being dehydrated). Losing weight too quickly. Having an organ transplant. Having lead poisoning. Taking certain medicines. Having kidney disease. Having a skin condition called psoriasis. What are the signs or symptoms? An attack of acute gout usually happens in just one joint. The most common place is the big toe. Attacks often start at night. Other joints that may be affected include joints of the feet, ankle, knee, fingers, wrist, or elbow. Symptoms of an attack may include: Very bad pain. Warmth. Swelling. Stiffness. Shiny, red, or purple skin. Tenderness. The affected joint may be very painful to touch. Chills and fever. Chronic gout may cause symptoms more often. More joints may be involved. You may also have white or yellow lumps (tophi) on your hands or feet or in other areas near your joints. How is this treated? Treatment for this condition has two phases:  treating an acute attack and preventing future attacks. Acute gout treatment may include: NSAIDs. Steroids. These are taken by mouth or injected into a joint. Colchicine. This medicine relieves pain and swelling. It can be given by mouth or through an IV tube. Preventive treatment may include: Taking small doses of NSAIDs or colchicine daily. Using a medicine that reduces uric acid levels in your blood. Making changes to your diet. You may need to see a food expert (dietitian) about what to eat and drink to prevent gout. Follow these instructions at home: During a gout attack  If told, put ice on the painful area: Put ice in a plastic bag. Place a towel between your skin and the bag. Leave the ice on for 20 minutes, 2-3 times a day. Raise (elevate) the painful joint above the level of your heart as often as you can. Rest the joint as much as possible. If the joint is in your leg, you may be given crutches. Follow instructions from your doctor about what you cannot eat or drink. Avoiding future gout attacks Eat a low-purine diet. Avoid foods and drinks such as: Liver. Kidney. Anchovies. Asparagus. Herring. Mushrooms. Mussels. Beer. Stay at a healthy weight. If you want to lose weight, talk with your doctor. Do not lose weight too fast. Start or continue an exercise plan as told by your doctor. Eating and drinking Drink enough fluids to keep your pee (urine) pale yellow. If you drink alcohol: Limit how much you use to: 0-1 drink a day for women. 0-2 drinks a day for men. Be aware of   how much alcohol is in your drink. In the [place] one drink equals one 12 oz bottle of beer (355 mL), one 5 oz glass of wine (148 mL), or one 1 oz glass of hard liquor (44 mL). General instructions Take over-the-counter and prescription medicines only as told by your doctor. Do not drive or use heavy machinery while taking prescription pain medicine. Return to your normal activities as told by your  doctor. Ask your doctor what activities are safe for you. Keep all follow-up visits as told by your doctor. This is important. Contact a doctor if: You have another gout attack. You still have symptoms of a gout attack after 10 days of treatment. You have problems (side effects) because of your medicines. You have chills or a fever. You have burning pain when you pee (urinate). You have pain in your lower back or belly. Get help right away if: You have very bad pain. Your pain cannot be controlled. You cannot pee. Summary Gout is painful swelling of the joints. The most common site of pain is the big toe, but it can affect other joints. Medicines and avoiding some foods can help to prevent and treat gout attacks. This information is not intended to replace advice given to you by your health care provider. Make sure you discuss any questions you have with your health care provider. Document Revised: 04/03/2018 Document Reviewed: 04/03/2018 Elsevier Patient Education  2022 [employer] Inc.  

## 2021-04-01 ENCOUNTER — Encounter: Payer: Self-pay | Admitting: Hematology & Oncology

## 2021-04-01 ENCOUNTER — Encounter: Payer: Self-pay | Admitting: Nurse Practitioner

## 2021-04-01 LAB — URIC ACID: Uric Acid: 8 mg/dL (ref 3.8–8.4)

## 2021-04-01 LAB — CMP14+EGFR
ALT: 32 IU/L (ref 0–44)
AST: 27 IU/L (ref 0–40)
Albumin/Globulin Ratio: 1.9 (ref 1.2–2.2)
Albumin: 3.6 g/dL — ABNORMAL LOW (ref 3.8–4.8)
Alkaline Phosphatase: 76 IU/L (ref 44–121)
BUN/Creatinine Ratio: 17 (ref 10–24)
BUN: 31 mg/dL — ABNORMAL HIGH (ref 8–27)
Bilirubin Total: 0.6 mg/dL (ref 0.0–1.2)
CO2: 21 mmol/L (ref 20–29)
Calcium: 8.8 mg/dL (ref 8.6–10.2)
Chloride: 106 mmol/L (ref 96–106)
Creatinine, Ser: 1.84 mg/dL — ABNORMAL HIGH (ref 0.76–1.27)
Globulin, Total: 1.9 g/dL (ref 1.5–4.5)
Glucose: 172 mg/dL — ABNORMAL HIGH (ref 65–99)
Potassium: 3.8 mmol/L (ref 3.5–5.2)
Sodium: 146 mmol/L — ABNORMAL HIGH (ref 134–144)
Total Protein: 5.5 g/dL — ABNORMAL LOW (ref 6.0–8.5)
eGFR: 39 mL/min/{1.73_m2} — ABNORMAL LOW (ref >59)

## 2021-04-01 LAB — CBC
Hematocrit: 36 % — ABNORMAL LOW (ref 37.5–51.0)
Hemoglobin: 11.8 g/dL — ABNORMAL LOW (ref 13.0–17.7)
MCH: 29.4 pg (ref 26.6–33.0)
MCHC: 32.8 g/dL (ref 31.5–35.7)
MCV: 90 fL (ref 79–97)
Platelets: 173 10*3/uL (ref 150–450)
RBC: 4.01 x10E6/uL — ABNORMAL LOW (ref 4.14–5.80)
RDW: 15.8 % — ABNORMAL HIGH (ref 11.6–15.4)
WBC: 8.9 10*3/uL (ref 3.4–10.8)

## 2021-04-04 NOTE — Progress Notes (Signed)
Remote ICD transmission.   

## 2021-04-05 ENCOUNTER — Other Ambulatory Visit: Payer: Self-pay | Admitting: Internal Medicine

## 2021-04-05 MED ORDER — PREDNISONE 10 MG PO TABS: ORAL_TABLET | ORAL | 0 refills | Status: DC

## 2021-04-06 ENCOUNTER — Other Ambulatory Visit: Payer: Self-pay | Admitting: Hematology & Oncology

## 2021-04-07 ENCOUNTER — Other Ambulatory Visit: Payer: Self-pay

## 2021-04-07 ENCOUNTER — Inpatient Hospital Stay (HOSPITAL_BASED_OUTPATIENT_CLINIC_OR_DEPARTMENT_OTHER): Payer: Medicare Other | Admitting: Family

## 2021-04-07 ENCOUNTER — Inpatient Hospital Stay: Payer: Medicare Other

## 2021-04-07 ENCOUNTER — Inpatient Hospital Stay: Payer: Medicare Other | Attending: Family

## 2021-04-07 VITALS — BP 136/87 | HR 89 | Temp 98.0°F | Resp 17 | Wt 219.0 lb

## 2021-04-07 DIAGNOSIS — Z5112 Encounter for antineoplastic immunotherapy: Secondary | ICD-10-CM | POA: Diagnosis not present

## 2021-04-07 DIAGNOSIS — Z79899 Other long term (current) drug therapy: Secondary | ICD-10-CM | POA: Diagnosis not present

## 2021-04-07 DIAGNOSIS — R5383 Other fatigue: Secondary | ICD-10-CM | POA: Diagnosis not present

## 2021-04-07 DIAGNOSIS — C9001 Multiple myeloma in remission: Secondary | ICD-10-CM

## 2021-04-07 DIAGNOSIS — C9000 Multiple myeloma not having achieved remission: Secondary | ICD-10-CM

## 2021-04-07 DIAGNOSIS — Z5111 Encounter for antineoplastic chemotherapy: Secondary | ICD-10-CM | POA: Diagnosis not present

## 2021-04-07 LAB — CBC WITH DIFFERENTIAL (CANCER CENTER ONLY)
Abs Immature Granulocytes: 0.03 10*3/uL (ref 0.00–0.07)
Basophils Absolute: 0 10*3/uL (ref 0.0–0.1)
Basophils Relative: 0 %
Eosinophils Absolute: 0 10*3/uL (ref 0.0–0.5)
Eosinophils Relative: 0 %
HCT: 35.8 % — ABNORMAL LOW (ref 39.0–52.0)
Hemoglobin: 11.8 g/dL — ABNORMAL LOW (ref 13.0–17.0)
Immature Granulocytes: 0 %
Lymphocytes Relative: 5 %
Lymphs Abs: 0.5 10*3/uL — ABNORMAL LOW (ref 0.7–4.0)
MCH: 30 pg (ref 26.0–34.0)
MCHC: 33 g/dL (ref 30.0–36.0)
MCV: 91.1 fL (ref 80.0–100.0)
Monocytes Absolute: 1 10*3/uL (ref 0.1–1.0)
Monocytes Relative: 11 %
Neutro Abs: 7.5 10*3/uL (ref 1.7–7.7)
Neutrophils Relative %: 84 %
Platelet Count: 152 10*3/uL (ref 150–400)
RBC: 3.93 MIL/uL — ABNORMAL LOW (ref 4.22–5.81)
RDW: 17.7 % — ABNORMAL HIGH (ref 11.5–15.5)
WBC Count: 9 10*3/uL (ref 4.0–10.5)
nRBC: 0 % (ref 0.0–0.2)

## 2021-04-07 LAB — CMP (CANCER CENTER ONLY)
ALT: 31 U/L (ref 0–44)
AST: 26 U/L (ref 15–41)
Albumin: 3.7 g/dL (ref 3.5–5.0)
Alkaline Phosphatase: 81 U/L (ref 38–126)
Anion gap: 8 (ref 5–15)
BUN: 37 mg/dL — ABNORMAL HIGH (ref 8–23)
CO2: 23 mmol/L (ref 22–32)
Calcium: 8.9 mg/dL (ref 8.9–10.3)
Chloride: 106 mmol/L (ref 98–111)
Creatinine: 1.71 mg/dL — ABNORMAL HIGH (ref 0.61–1.24)
GFR, Estimated: 43 mL/min — ABNORMAL LOW
Glucose, Bld: 168 mg/dL — ABNORMAL HIGH (ref 70–99)
Potassium: 3.8 mmol/L (ref 3.5–5.1)
Sodium: 137 mmol/L (ref 135–145)
Total Bilirubin: 0.7 mg/dL (ref 0.3–1.2)
Total Protein: 5.4 g/dL — ABNORMAL LOW (ref 6.5–8.1)

## 2021-04-07 LAB — LACTATE DEHYDROGENASE: LDH: 243 U/L — ABNORMAL HIGH (ref 98–192)

## 2021-04-07 MED ORDER — SODIUM CHLORIDE 0.9 % IV SOLN
Freq: Once | INTRAVENOUS | Status: AC
Start: 2021-04-07 — End: 2021-04-07
  Filled 2021-04-07: qty 250

## 2021-04-07 MED ORDER — PALONOSETRON HCL INJECTION 0.25 MG/5ML
0.2500 mg | Freq: Once | INTRAVENOUS | Status: AC
  Administered 2021-04-07: 0.25 mg via INTRAVENOUS

## 2021-04-07 MED ORDER — PALONOSETRON HCL INJECTION 0.25 MG/5ML
INTRAVENOUS | Status: AC
  Filled 2021-04-07: qty 5

## 2021-04-07 MED ORDER — SODIUM CHLORIDE 0.9 % IV SOLN
20.0000 mg | Freq: Once | INTRAVENOUS | Status: AC
  Administered 2021-04-07: 20 mg via INTRAVENOUS
  Filled 2021-04-07: qty 20

## 2021-04-07 MED ORDER — BORTEZOMIB CHEMO SQ INJECTION 3.5 MG (2.5MG/ML)
1.3000 mg/m2 | Freq: Once | INTRAMUSCULAR | Status: AC
  Administered 2021-04-07: 3 mg via SUBCUTANEOUS
  Filled 2021-04-07: qty 1.2

## 2021-04-07 MED ORDER — SODIUM CHLORIDE 0.9 % IV SOLN
400.0000 mg/m2 | Freq: Once | INTRAVENOUS | Status: AC
  Administered 2021-04-07: 900 mg via INTRAVENOUS
  Filled 2021-04-07: qty 45

## 2021-04-07 NOTE — Patient Instructions (Signed)
[facility]  Discharge Instructions: °Thank you for choosing [facility] to provide your oncology and hematology care.  ° °If you have a lab appointment with the Cancer Center, please go directly to the Cancer Center and check in at the registration area. ° °Wear comfortable clothing and clothing appropriate for easy access to any Portacath or PICC line.  ° °We strive to give you quality time with your provider. You may need to reschedule your appointment if you arrive late (15 or more minutes).  Arriving late affects you and other patients whose appointments are after yours.  Also, if you miss three or more appointments without notifying the office, you may be dismissed from the clinic at the provider’s discretion.    °  °For prescription refill requests, have your pharmacy contact our office and allow 72 hours for refills to be completed.   °Today you received the following chemotherapy and/or immunotherapy agents velcade,  cytoxan  °  °To help prevent nausea and vomiting after your treatment, we encourage you to take your nausea medication as directed. ° °BELOW ARE SYMPTOMS THAT SHOULD BE REPORTED IMMEDIATELY: °*FEVER GREATER THAN 100.4 F (38 °C) OR HIGHER °*CHILLS OR SWEATING °*NAUSEA AND VOMITING THAT IS NOT CONTROLLED WITH YOUR NAUSEA MEDICATION °*UNUSUAL SHORTNESS OF BREATH °*UNUSUAL BRUISING OR BLEEDING °*URINARY PROBLEMS (pain or burning when urinating, or frequent urination) °*BOWEL PROBLEMS (unusual diarrhea, constipation, pain near the anus) °TENDERNESS IN MOUTH AND THROAT WITH OR WITHOUT PRESENCE OF ULCERS (sore throat, sores in mouth, or a toothache) °UNUSUAL RASH, SWELLING OR PAIN  °UNUSUAL VAGINAL DISCHARGE OR ITCHING  ° °Items with * indicate a potential emergency and should be followed up as soon as possible or go to the Emergency Department if any problems should occur. ° °Please show the CHEMOTHERAPY ALERT CARD or IMMUNOTHERAPY ALERT CARD at check-in to  the Emergency Department and triage nurse. °Should you have questions after your visit or need to cancel or reschedule your appointment, please contact [facility]  [phone] and follow the prompts.  Office hours are 8:00 a.m. to 4:30 p.m. Monday - Friday. Please note that voicemails left after 4:00 p.m. may not be returned until the following business day.  We are closed weekends and major holidays. You have access to a nurse at all times for urgent questions. Please call the main number to the clinic [phone] and follow the prompts. ° °For any non-urgent questions, you may also contact your provider using MyChart. We now offer e-Visits for anyone 18 and older to request care online for non-urgent symptoms. For details visit [email or web address]. °  °Also download the MyChart app! Go to the app store, search "MyChart", open the app, select [facility], and log in with your MyChart username and password. ° °Due to Covid, a mask is required upon entering the hospital/clinic. If you do not have a mask, one will be given to you upon arrival. For doctor visits, patients may have 1 support person aged 18 or older with them. For treatment visits, patients cannot have anyone with them due to current Covid guidelines and our immunocompromised population.  °

## 2021-04-07 NOTE — Progress Notes (Signed)
Hematology and Oncology Follow Up Visit  Eric Buffa., MD [id number] October 17, 1950 70 y.o. 04/07/2021   Principle Diagnosis:  IgG kappa myeloma  - 1q+   Current Therapy:        CyBorD -- s/p cycle 9 - start on 06/04/2020   Interim History:  Eric [name] is here today for follow-up and treatment. He is dong fairly well. He has been quite busy helping with his wife's care. She is currently in the hospital on dialysis. He is fatigued today as he was up late last night with her.  IN June, M-spike was not observed, IgG level was 333 mg/dL and kappa light chains 1.46 mg/dL.  He has some dizziness at times after taking his BIDIL. He makes sure to eat prior to taking which helps.  No fever, chills, n/v, cough, rash, SOB, chest pain, palpitations, abdominal pain or changes in bowel or bladder habits.  No blood loss noted. No bruising or petechiae.  No swelling or tenderness in his extremities.  The numbness and tingling in his right lower extremity is unchanged from baseline.  No falls or syncope to report.  He has maintained a good appetite and is doing his best to stay well hydrated. His weight is stable at 219 lbs.   ECOG Performance Status: 1 - Symptomatic but completely ambulatory  Medications:  Allergies as of 04/07/2021       Reactions   Lactose Intolerance (gi) Diarrhea        Medication List        Accurate as of April 07, 2021  1:31 PM. If you have any questions, ask your nurse or doctor.          acetaminophen 500 MG tablet Commonly known as: TYLENOL Take 1,000 mg by mouth every 4 (four) hours as needed for moderate pain or headache.   amiodarone 100 MG tablet Commonly known as: PACERONE TAKE 1 TABLET(100 MG) BY MOUTH DAILY. PLEASE MAKE OVERDUE APPOINTMENT WITH DOCTOR TAYLOR BEFORE ANYMORE REFILLS. THANK YOU FIRST ATTEMPT   apixaban 5 MG Tabs tablet Commonly known as: Eliquis Take 1 tablet (5 mg total) by mouth 2 (two) times daily.   ARTIFICIAL TEARS OP Place  1 drop into both eyes daily as needed (dry eyes).   BiDil 20-37.5 MG tablet Generic drug: isosorbide-hydrALAZINE Take 1 tablet by mouth 3 (three) times daily.   carvedilol 6.25 MG tablet Commonly known as: COREG TAKE 1 TABLET(6.25 MG) BY MOUTH TWICE DAILY   docusate sodium 100 MG capsule Commonly known as: COLACE Take 100 mg by mouth daily as needed for mild constipation.   famciclovir 500 MG tablet Commonly known as: FAMVIR Take 1 tablet (500 mg total) by mouth daily.   fluticasone 50 MCG/ACT nasal spray Commonly known as: FLONASE Place 2 sprays into both nostrils daily.   furosemide 80 MG tablet Commonly known as: LASIX Take 1 tablet (80 mg total) by mouth daily.   ipratropium 0.06 % nasal spray Commonly known as: ATROVENT Place 2 sprays into both nostrils 2 (two) times daily.   levothyroxine 75 MCG tablet Commonly known as: SYNTHROID TAKE 1 TABLET BY MOUTH DAILY IN THE MORNING. EXCEPT TAKE 1/2 TABLET ON SUNDAYS IN THE MORNING   loperamide 2 MG capsule Commonly known as: IMODIUM Take 2 mg by mouth as needed for diarrhea or loose stools.   ondansetron 8 MG tablet Commonly known as: Zofran Take 1 tablet (8 mg total) by mouth 2 (two) times daily as needed for refractory nausea /  vomiting. Start on day 3 after Cytoxan.   potassium chloride 20 MEQ/15ML (10%) Soln Take 15 mLs (20 mEq total) by mouth 2 (two) times daily. What changed: when to take this   predniSONE 10 MG tablet Commonly known as: DELTASONE Take 4 tabs x 2 days, then 3 tabs x 2 days, then 2 tabs x 2 days, then 1 tab daily   prochlorperazine 10 MG tablet Commonly known as: COMPAZINE Take 1 tablet (10 mg total) by mouth every 6 (six) hours as needed (Nausea or vomiting).   rosuvastatin 20 MG tablet Commonly known as: CRESTOR Take 1 tablet (20 mg total) by mouth daily.   vitamin B-12 100 MCG tablet Commonly known as: CYANOCOBALAMIN Take 100 mcg by mouth daily.   vitamin C 500 MG tablet Commonly  known as: ASCORBIC ACID Take 500 mg by mouth daily.   VITAMIN D PO Take 1 capsule by mouth daily.        Allergies:  Allergies  Allergen Reactions   Lactose Intolerance (Gi) Diarrhea    Past Medical History, Surgical history, Social history, and Family History were reviewed and updated.  Review of Systems: All other 10 point review of systems is negative.   Physical Exam:  weight is 219 lb (99.3 kg). His oral temperature is 98 F (36.7 C). His blood pressure is 136/87 and his pulse is 89. His respiration is 17 and oxygen saturation is 100%.   Wt Readings from Last 3 Encounters:  04/07/21 219 lb (99.3 kg)  03/22/21 220 lb (99.8 kg)  02/25/21 218 lb 12.8 oz (99.2 kg)    Ocular: Sclerae unicteric, pupils equal, round and reactive to light Ear-nose-throat: Oropharynx clear, dentition fair Lymphatic: No cervical or supraclavicular adenopathy Lungs no rales or rhonchi, good excursion bilaterally Heart regular rate and rhythm, no murmur appreciated Abd soft, nontender, positive bowel sounds MSK no focal spinal tenderness, no joint edema Neuro: non-focal, well-oriented, appropriate affect Breasts: Deferred   Lab Results  Component Value Date   WBC 9.0 04/07/2021   HGB 11.8 (L) 04/07/2021   HCT 35.8 (L) 04/07/2021   MCV 91.1 04/07/2021   PLT 152 04/07/2021   Lab Results  Component Value Date   FERRITIN 77 08/11/2020   IRON 39 (L) 08/11/2020   TIBC 400 08/11/2020   UIBC 361 08/11/2020   IRONPCTSAT 10 (L) 08/11/2020   Lab Results  Component Value Date   RETICCTPCT 0.4 08/11/2020   RBC 3.93 (L) 04/07/2021   Lab Results  Component Value Date   KPAFRELGTCHN 14.6 02/25/2021   LAMBDASER 11.3 02/25/2021   KAPLAMBRATIO 1.29 02/25/2021   Lab Results  Component Value Date   IGGSERUM 333 (L) 02/25/2021   IGA 42 (L) 02/25/2021   IGMSERUM 9 (L) 02/25/2021   Lab Results  Component Value Date   TOTALPROTELP 5.1 (L) 02/25/2021   ALBUMINELP 3.3 02/25/2021   A1GS 0.2  02/25/2021   A2GS 0.5 02/25/2021   BETS 0.8 02/25/2021   GAMS 0.2 (L) 02/25/2021   MSPIKE Not Observed 02/25/2021     Chemistry      Component Value Date/Time   NA 146 (H) 03/31/2021 1707   K 3.8 03/31/2021 1707   CL 106 03/31/2021 1707   CO2 21 03/31/2021 1707   BUN 31 (H) 03/31/2021 1707   CREATININE 1.84 (H) 03/31/2021 1707   CREATININE 2.12 (H) 02/25/2021 0950   CREATININE 1.95 (H) 09/08/2016 0859      Component Value Date/Time   CALCIUM 8.8 03/31/2021 1707  ALKPHOS 76 03/31/2021 1707   AST 27 03/31/2021 1707   AST 28 02/25/2021 0950   ALT 32 03/31/2021 1707   ALT 22 02/25/2021 0950   BILITOT 0.6 03/31/2021 1707   BILITOT 0.8 02/25/2021 0950       Impression and Plan: Eric [name] is a very nice 70 yo African American gentleman with IgG kappa myeloma.   We will proceed with treatment today as planned.  Follow-up in 2 weeks.  He can contact our office with any questions or concerns.   Laverna Peace, NP 7/14/20221:31 PM

## 2021-04-08 LAB — IGG, IGA, IGM
IgA: 62 mg/dL (ref 61–437)
IgG (Immunoglobin G), Serum: 415 mg/dL — ABNORMAL LOW (ref 603–1613)
IgM (Immunoglobulin M), Srm: 12 mg/dL — ABNORMAL LOW (ref 20–172)

## 2021-04-08 LAB — KAPPA/LAMBDA LIGHT CHAINS
Kappa free light chain: 11.7 mg/L (ref 3.3–19.4)
Kappa, lambda light chain ratio: 1.13 (ref 0.26–1.65)
Lambda free light chains: 10.4 mg/L (ref 5.7–26.3)

## 2021-04-11 LAB — PROTEIN ELECTROPHORESIS, SERUM, WITH REFLEX
A/G Ratio: 1.5 (ref 0.7–1.7)
Albumin ELP: 3.3 g/dL (ref 2.9–4.4)
Alpha-1-Globulin: 0.3 g/dL (ref 0.0–0.4)
Alpha-2-Globulin: 0.6 g/dL (ref 0.4–1.0)
Beta Globulin: 0.9 g/dL (ref 0.7–1.3)
Gamma Globulin: 0.4 g/dL (ref 0.4–1.8)
Globulin, Total: 2.2 g/dL (ref 2.2–3.9)
Total Protein ELP: 5.5 g/dL — ABNORMAL LOW (ref 6.0–8.5)

## 2021-04-12 ENCOUNTER — Telehealth: Payer: Self-pay | Admitting: Internal Medicine

## 2021-04-12 NOTE — Telephone Encounter (Signed)
Patients device checked and is compatible with MRI. Attempted to contact [facility] for clarification [phone], no answer. Will follow up at later time. Patient made aware.

## 2021-04-12 NOTE — Telephone Encounter (Signed)
Pt had an MRI ordered at [facility]  They called and cancelled telling him that due to his device, he could not have one.  Pt states he was not aware of this.  Advised I will send message to device team and have them follow up with him.

## 2021-04-14 ENCOUNTER — Encounter: Payer: Self-pay | Admitting: Podiatry

## 2021-04-14 NOTE — Telephone Encounter (Signed)
Attempted to reach [facility] again, no answer. LMTCB.

## 2021-04-18 NOTE — Telephone Encounter (Signed)
Called [facility] and spoke to lady (did not get name), states she will have the nurse call back to discuss patient and device.

## 2021-04-18 NOTE — Telephone Encounter (Signed)
Spoke to Henryetta at [name], states she called and spoke to [facility] and that patients device was not compatible with MRI. States they offered him an "Off label MRI" but patient declined. States patient had CT that showed everything they needed. Attempted to have follow up phone call with patient to discuss.   No answer, LMTCB.

## 2021-04-22 ENCOUNTER — Other Ambulatory Visit: Payer: Self-pay

## 2021-04-22 ENCOUNTER — Encounter: Payer: Self-pay | Admitting: Hematology & Oncology

## 2021-04-22 ENCOUNTER — Inpatient Hospital Stay (HOSPITAL_BASED_OUTPATIENT_CLINIC_OR_DEPARTMENT_OTHER): Payer: Medicare Other | Admitting: Hematology & Oncology

## 2021-04-22 ENCOUNTER — Inpatient Hospital Stay: Payer: Medicare Other

## 2021-04-22 VITALS — BP 132/88 | HR 86 | Temp 98.3°F | Resp 20 | Wt 216.1 lb

## 2021-04-22 DIAGNOSIS — C9000 Multiple myeloma not having achieved remission: Secondary | ICD-10-CM | POA: Diagnosis not present

## 2021-04-22 DIAGNOSIS — Z5112 Encounter for antineoplastic immunotherapy: Secondary | ICD-10-CM | POA: Diagnosis not present

## 2021-04-22 DIAGNOSIS — Z5111 Encounter for antineoplastic chemotherapy: Secondary | ICD-10-CM | POA: Diagnosis not present

## 2021-04-22 DIAGNOSIS — Z79899 Other long term (current) drug therapy: Secondary | ICD-10-CM | POA: Diagnosis not present

## 2021-04-22 DIAGNOSIS — R5383 Other fatigue: Secondary | ICD-10-CM | POA: Diagnosis not present

## 2021-04-22 LAB — CMP (CANCER CENTER ONLY)
ALT: 26 U/L (ref 0–44)
AST: 22 U/L (ref 15–41)
Albumin: 3.5 g/dL (ref 3.5–5.0)
Alkaline Phosphatase: 72 U/L (ref 38–126)
Anion gap: 9 (ref 5–15)
BUN: 31 mg/dL — ABNORMAL HIGH (ref 8–23)
CO2: 26 mmol/L (ref 22–32)
Calcium: 8.6 mg/dL — ABNORMAL LOW (ref 8.9–10.3)
Chloride: 104 mmol/L (ref 98–111)
Creatinine: 1.83 mg/dL — ABNORMAL HIGH (ref 0.61–1.24)
GFR, Estimated: 39 mL/min — ABNORMAL LOW (ref >60)
Glucose, Bld: 171 mg/dL — ABNORMAL HIGH (ref 70–99)
Potassium: 3.9 mmol/L (ref 3.5–5.1)
Sodium: 139 mmol/L (ref 135–145)
Total Bilirubin: 0.7 mg/dL (ref 0.3–1.2)
Total Protein: 5.3 g/dL — ABNORMAL LOW (ref 6.5–8.1)

## 2021-04-22 LAB — CBC WITH DIFFERENTIAL (CANCER CENTER ONLY)
Abs Immature Granulocytes: 0.04 10*3/uL (ref 0.00–0.07)
Basophils Absolute: 0 10*3/uL (ref 0.0–0.1)
Basophils Relative: 0 %
Eosinophils Absolute: 0.1 10*3/uL (ref 0.0–0.5)
Eosinophils Relative: 1 %
HCT: 34.9 % — ABNORMAL LOW (ref 39.0–52.0)
Hemoglobin: 11.4 g/dL — ABNORMAL LOW (ref 13.0–17.0)
Immature Granulocytes: 1 %
Lymphocytes Relative: 7 %
Lymphs Abs: 0.4 10*3/uL — ABNORMAL LOW (ref 0.7–4.0)
MCH: 29.4 pg (ref 26.0–34.0)
MCHC: 32.7 g/dL (ref 30.0–36.0)
MCV: 89.9 fL (ref 80.0–100.0)
Monocytes Absolute: 0.7 10*3/uL (ref 0.1–1.0)
Monocytes Relative: 11 %
Neutro Abs: 5 10*3/uL (ref 1.7–7.7)
Neutrophils Relative %: 80 %
Platelet Count: 149 10*3/uL — ABNORMAL LOW (ref 150–400)
RBC: 3.88 MIL/uL — ABNORMAL LOW (ref 4.22–5.81)
RDW: 18.2 % — ABNORMAL HIGH (ref 11.5–15.5)
WBC Count: 6.2 10*3/uL (ref 4.0–10.5)
nRBC: 0 % (ref 0.0–0.2)

## 2021-04-22 LAB — LACTATE DEHYDROGENASE: LDH: 259 U/L — ABNORMAL HIGH (ref 98–192)

## 2021-04-22 MED ORDER — PALONOSETRON HCL INJECTION 0.25 MG/5ML
INTRAVENOUS | Status: AC
  Filled 2021-04-22: qty 5

## 2021-04-22 MED ORDER — SODIUM CHLORIDE 0.9 % IV SOLN
20.0000 mg | Freq: Once | INTRAVENOUS | Status: AC
  Administered 2021-04-22: 20 mg via INTRAVENOUS
  Filled 2021-04-22: qty 20

## 2021-04-22 MED ORDER — SODIUM CHLORIDE 0.9 % IV SOLN
400.0000 mg/m2 | Freq: Once | INTRAVENOUS | Status: AC
  Administered 2021-04-22: 900 mg via INTRAVENOUS
  Filled 2021-04-22: qty 45

## 2021-04-22 MED ORDER — SODIUM CHLORIDE 0.9 % IV SOLN
Freq: Once | INTRAVENOUS | Status: AC
  Filled 2021-04-22: qty 250

## 2021-04-22 MED ORDER — PALONOSETRON HCL INJECTION 0.25 MG/5ML
0.2500 mg | Freq: Once | INTRAVENOUS | Status: AC
  Administered 2021-04-22: 0.25 mg via INTRAVENOUS

## 2021-04-22 MED ORDER — BORTEZOMIB CHEMO SQ INJECTION 3.5 MG (2.5MG/ML)
1.3000 mg/m2 | Freq: Once | INTRAMUSCULAR | Status: AC
  Administered 2021-04-22: 3 mg via SUBCUTANEOUS
  Filled 2021-04-22: qty 1.2

## 2021-04-22 NOTE — Progress Notes (Signed)
Hematology and Oncology Follow Up Visit  Eric [name]., MD [id number] April 29, 1951 70 y.o. 04/22/2021   Principle Diagnosis:  IgG kappa myeloma  - 1q+   Current Therapy:        CyBorD -- s/p cycle 10 - started on 06/04/2020   Interim History:  Eric [name] is here today for follow-up and treatment.  His wife is in rehab now.  He is going back and forth from his home to the rehab center.  He also is working.  He still does ophthalmology.  He other overall seems to be doing pretty well.  He has responded very nicely to our therapy for the myeloma.  His he had no monoclonal spike in his blood we last checked him in July.  His IgG level was 450 mg/dL.  The Kappa light chain was 11.7 mg/L.  He has had no issues with fever.  He has had no bleeding.  There is no change in bowel or bladder habits.  He has had no pain issues.  He has had no leg swelling.  There is been no rashes.  Has chronic renal insufficiency from his diabetes.  This is not any worse.  Thankfully his anemia seems to be holding pretty steady.  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     Medications:  Allergies as of 04/22/2021       Reactions   Lactose Intolerance (gi) Diarrhea        Medication List        Accurate as of April 22, 2021 10:42 AM. If you have any questions, ask your nurse or doctor.          STOP taking these medications    predniSONE 10 MG tablet Commonly known as: DELTASONE Stopped by: Volanda Napoleon, MD       TAKE these medications    acetaminophen 500 MG tablet Commonly known as: TYLENOL Take 1,000 mg by mouth every 4 (four) hours as needed for moderate pain or headache.   amiodarone 100 MG tablet Commonly known as: PACERONE TAKE 1 TABLET(100 MG) BY MOUTH DAILY. PLEASE MAKE OVERDUE APPOINTMENT WITH DOCTOR TAYLOR BEFORE ANYMORE REFILLS. THANK YOU FIRST ATTEMPT   apixaban 5 MG Tabs tablet Commonly known as: Eliquis Take 1 tablet (5 mg total) by mouth 2 (two) times  daily.   ARTIFICIAL TEARS OP Place 1 drop into both eyes daily as needed (dry eyes).   BiDil 20-37.5 MG tablet Generic drug: isosorbide-hydrALAZINE Take 1 tablet by mouth 3 (three) times daily.   carvedilol 6.25 MG tablet Commonly known as: COREG TAKE 1 TABLET(6.25 MG) BY MOUTH TWICE DAILY   docusate sodium 100 MG capsule Commonly known as: COLACE Take 100 mg by mouth daily as needed for mild constipation.   famciclovir 500 MG tablet Commonly known as: FAMVIR Take 1 tablet (500 mg total) by mouth daily.   fluticasone 50 MCG/ACT nasal spray Commonly known as: FLONASE Place 2 sprays into both nostrils daily.   furosemide 80 MG tablet Commonly known as: LASIX Take 1 tablet (80 mg total) by mouth daily.   ipratropium 0.06 % nasal spray Commonly known as: ATROVENT Place 2 sprays into both nostrils 2 (two) times daily.   levothyroxine 75 MCG tablet Commonly known as: SYNTHROID TAKE 1 TABLET BY MOUTH DAILY IN THE MORNING. EXCEPT TAKE 1/2 TABLET ON SUNDAYS IN THE MORNING   loperamide 2 MG capsule Commonly known as: IMODIUM Take 2 mg by mouth as needed for diarrhea or loose stools.  ondansetron 8 MG tablet Commonly known as: Zofran Take 1 tablet (8 mg total) by mouth 2 (two) times daily as needed for refractory nausea / vomiting. Start on day 3 after Cytoxan.   potassium chloride 20 MEQ/15ML (10%) Soln Take 15 mLs (20 mEq total) by mouth 2 (two) times daily. What changed: when to take this   prochlorperazine 10 MG tablet Commonly known as: COMPAZINE Take 1 tablet (10 mg total) by mouth every 6 (six) hours as needed (Nausea or vomiting).   rosuvastatin 20 MG tablet Commonly known as: CRESTOR Take 1 tablet (20 mg total) by mouth daily.   vitamin B-12 100 MCG tablet Commonly known as: CYANOCOBALAMIN Take 100 mcg by mouth daily.   vitamin C 500 MG tablet Commonly known as: ASCORBIC ACID Take 500 mg by mouth daily.   VITAMIN D PO Take 1 capsule by mouth daily.         Allergies:  Allergies  Allergen Reactions   Lactose Intolerance (Gi) Diarrhea    Past Medical History, Surgical history, Social history, and Family History were reviewed and updated.  Review of Systems: All other 10 point review of systems is negative.   Physical Exam:  weight is 216 lb 1.3 oz (98 kg). His oral temperature is 98.3 F (36.8 C). His blood pressure is 132/88 and his pulse is 86. His respiration is 20 and oxygen saturation is 100%.   Wt Readings from Last 3 Encounters:  04/22/21 216 lb 1.3 oz (98 kg)  04/07/21 219 lb (99.3 kg)  03/22/21 220 lb (99.8 kg)    Ocular: Sclerae unicteric, pupils equal, round and reactive to light Ear-nose-throat: Oropharynx clear, dentition fair Lymphatic: No cervical or supraclavicular adenopathy Lungs no rales or rhonchi, good excursion bilaterally Heart regular rate and rhythm, no murmur appreciated Abd soft, nontender, positive bowel sounds MSK no focal spinal tenderness, no joint edema Neuro: non-focal, well-oriented, appropriate affect Breasts: Deferred   Lab Results  Component Value Date   WBC 6.2 04/22/2021   HGB 11.4 (L) 04/22/2021   HCT 34.9 (L) 04/22/2021   MCV 89.9 04/22/2021   PLT 149 (L) 04/22/2021   Lab Results  Component Value Date   FERRITIN 77 08/11/2020   IRON 39 (L) 08/11/2020   TIBC 400 08/11/2020   UIBC 361 08/11/2020   IRONPCTSAT 10 (L) 08/11/2020   Lab Results  Component Value Date   RETICCTPCT 0.4 08/11/2020   RBC 3.88 (L) 04/22/2021   Lab Results  Component Value Date   KPAFRELGTCHN 11.7 04/07/2021   LAMBDASER 10.4 04/07/2021   KAPLAMBRATIO 1.13 04/07/2021   Lab Results  Component Value Date   IGGSERUM 415 (L) 04/07/2021   IGA 62 04/07/2021   IGMSERUM 12 (L) 04/07/2021   Lab Results  Component Value Date   TOTALPROTELP 5.5 (L) 04/07/2021   ALBUMINELP 3.3 04/07/2021   A1GS 0.3 04/07/2021   A2GS 0.6 04/07/2021   BETS 0.9 04/07/2021   GAMS 0.4 04/07/2021   MSPIKE Not  Observed 04/07/2021     Chemistry      Component Value Date/Time   NA 137 04/07/2021 1306   NA 146 (H) 03/31/2021 1707   K 3.8 04/07/2021 1306   CL 106 04/07/2021 1306   CO2 23 04/07/2021 1306   BUN 37 (H) 04/07/2021 1306   BUN 31 (H) 03/31/2021 1707   CREATININE 1.71 (H) 04/07/2021 1306   CREATININE 1.95 (H) 09/08/2016 0859      Component Value Date/Time   CALCIUM 8.9 04/07/2021 1306  ALKPHOS 81 04/07/2021 1306   AST 26 04/07/2021 1306   ALT 31 04/07/2021 1306   BILITOT 0.7 04/07/2021 1306       Impression and Plan: Eric [name] is a very nice 70 yo African American gentleman with IgG kappa myeloma.    He does not wish to have a stem cell transplant.  I realize this is a "gray area" given his age and overall performance status.  He really wants to make sure that his wife is taking care of.  Thankfully he has responded very nicely to treatment.  We will try to move his appointments out little bit longer if we can.  I think this would make life easier for him.  Given the fact that his myeloma numbers seem to show remission, I really think that we can move his treatments out little bit longer.  I will plan to get him back to see me in about 6 weeks.  We will see about moving his treatments out every 3 weeks.   Volanda Napoleon, MD 7/29/202210:42 AM

## 2021-04-22 NOTE — Patient Instructions (Addendum)
Bortezomib injection What is this medication? BORTEZOMIB (bor TEZ oh mib) targets proteins in cancer cells and stops thecancer cells from growing. It treats multiple myeloma and mantle cell lymphoma. This medicine may be used for other purposes; ask your health care provider orpharmacist if you have questions. COMMON BRAND NAME(S): Velcade What should I tell my care team before I take this medication? They need to know if you have any of these conditions: dehydration diabetes (high blood sugar) heart disease liver disease tingling of the fingers or toes or other nerve disorder an unusual or allergic reaction to bortezomib, mannitol, boron, other medicines, foods, dyes, or preservatives pregnant or trying to get pregnant breast-feeding How should I use this medication? This medicine is injected into a vein or under the skin. It is given by ahealth care provider in a hospital or clinic setting. Talk to your health care provider about the use of this medicine in children.Special care may be needed. Overdosage: If you think you have taken too much of this medicine contact apoison control center or emergency room at once. NOTE: This medicine is only for you. Do not share this medicine with others. What if I miss a dose? Keep appointments for follow-up doses. It is important not to miss your dose.Call your health care provider if you are unable to keep an appointment. What may interact with this medication? This medicine may interact with the following medications: ketoconazole rifampin This list may not describe all possible interactions. Give your health care provider a list of all the medicines, herbs, non-prescription drugs, or dietary supplements you use. Also tell them if you smoke, drink alcohol, or use illegaldrugs. Some items may interact with your medicine. What should I watch for while using this medication? Your condition will be monitored carefully while you are receiving  thismedicine. You may need blood work done while you are taking this medicine. You may get drowsy or dizzy. Do not drive, use machinery, or do anything that needs mental alertness until you know how this medicine affects you. Do not stand up or sit up quickly, especially if you are an older patient. Thisreduces the risk of dizzy or fainting spells This medicine may increase your risk of getting an infection. Call your health care provider for advice if you get a fever, chills, sore throat, or other symptoms of a cold or flu. Do not treat yourself. Try to avoid being aroundpeople who are sick. Check with your health care provider if you have severe diarrhea, nausea, and vomiting, or if you sweat a lot. The loss of too much body fluid may make itdangerous for you to take this medicine. Do not become pregnant while taking this medicine or for 7 months after stopping it. Women should inform their health care provider if they wish to become pregnant or think they might be pregnant. Men should not father a child while taking this medicine and for 4 months after stopping it. There is a potential for serious harm to an unborn child. Talk to your health care provider for more information. Do not breast-feed an infant while taking thismedicine or for 2 months after stopping it. This medicine may make it more difficult to get pregnant or father a child.Talk to your health care provider if you are concerned about your fertility. What side effects may I notice from receiving this medication? Side effects that you should report to your doctor or health care professionalas soon as possible: allergic reactions (skin rash; itching or hives; swelling   of the face, lips, or tongue) bleeding (bloody or black, tarry stools; red or dark brown urine; spitting up blood or brown material that looks like coffee grounds; red spots on the skin; unusual bruising or bleeding from the eye, gums, or nose) blurred vision or changes in  vision confusion constipation headache heart failure (trouble breathing; fast, irregular heartbeat; sudden weight gain; swelling of the ankles, feet, hands) infection (fever, chills, cough, sore throat, pain or trouble passing urine) lack or loss of appetite liver injury (dark yellow or brown urine; general ill feeling or flu-like symptoms; loss of appetite, right upper belly pain; yellowing of the eyes or skin) low blood pressure (dizziness; feeling faint or lightheaded, falls; unusually weak or tired) muscle cramps pain, redness, or irritation at site where injected pain, tingling, numbness in the hands or feet seizures trouble breathing unusual bruising or bleeding Side effects that usually do not require medical attention (report to yourdoctor or health care professional if they continue or are bothersome): diarrhea nausea stomach pain trouble sleeping vomiting This list may not describe all possible side effects. Call your doctor for medical advice about side effects. You may report side effects to FDA at1-800-FDA-1088. Where should I keep my medication? This medicine is given in a hospital or clinic. It will not be stored at home. NOTE: This sheet is a summary. It may not cover all possible information. If you have questions about this medicine, talk to your doctor, pharmacist, orhealth care provider.  2022 [employer]/Gold Standard (2020-09-02 13:22:53) Cyclophosphamide Injection What is this medication? CYCLOPHOSPHAMIDE (sye kloe FOSS fa mide) is a chemotherapy drug. It slows the growth of cancer cells. This medicine is used to treat many types of cancer like lymphoma, myeloma, leukemia, breast cancer, and ovarian cancer, to name afew. This medicine may be used for other purposes; ask your health care provider orpharmacist if you have questions. COMMON BRAND NAME(S): Cytoxan, Neosar What should I tell my care team before I take this medication? They need to know if you have any  of these conditions: heart disease history of irregular heartbeat infection kidney disease liver disease low blood counts, like white cells, platelets, or red blood cells on hemodialysis recent or ongoing radiation therapy scarring or thickening of the lungs trouble passing urine an unusual or allergic reaction to cyclophosphamide, other medicines, foods, dyes, or preservatives pregnant or trying to get pregnant breast-feeding How should I use this medication? This drug is usually given as an injection into a vein or muscle or by infusion into a vein. It is administered in a hospital or clinic by a specially trainedhealth care professional. Talk to your pediatrician regarding the use of this medicine in children.Special care may be needed. Overdosage: If you think you have taken too much of this medicine contact apoison control center or emergency room at once. NOTE: This medicine is only for you. Do not share this medicine with others. What if I miss a dose? It is important not to miss your dose. Call your doctor or health careprofessional if you are unable to keep an appointment. What may interact with this medication? amphotericin B azathioprine certain antivirals for HIV or hepatitis certain medicines for blood pressure, heart disease, irregular heart beat certain medicines that treat or prevent blood clots like warfarin certain other medicines for cancer cyclosporine etanercept indomethacin medicines that relax muscles for surgery medicines to increase blood counts metronidazole This list may not describe all possible interactions. Give your health care provider a list of all   the medicines, herbs, non-prescription drugs, or dietary supplements you use. Also tell them if you smoke, drink alcohol, or use illegaldrugs. Some items may interact with your medicine. What should I watch for while using this medication? Your condition will be monitored carefully while you are  receiving thismedicine. You may need blood work done while you are taking this medicine. Drink water or other fluids as directed. Urinate often, even at night. Some products may contain alcohol. Ask your health care professional if this medicine contains alcohol. Be sure to tell all health care professionals you are taking this medicine. Certain medicines, like metronidazole and disulfiram, can cause an unpleasant reaction when taken with alcohol. The reaction includes flushing, headache, nausea, vomiting, sweating, and increased thirst. Thereaction can last from 30 minutes to several hours. Do not become pregnant while taking this medicine or for 1 year after stopping it. Women should inform their health care professional if they wish to become pregnant or think they might be pregnant. Men should not father a child while taking this medicine and for 4 months after stopping it. There is potential for serious side effects to an unborn child. Talk to your health care professionalfor more information. Do not breast-feed an infant while taking this medicine or for 1 week afterstopping it. This medicine has caused ovarian failure in some women. This medicine may make it more difficult to get pregnant. Talk to your health care professional if youare concerned about your fertility. This medicine has caused decreased sperm counts in some men. This may make it more difficult to father a child. Talk to your health care professional if youare concerned about your fertility. Call your health care professional for advice if you get a fever, chills, or sore throat, or other symptoms of a cold or flu. Do not treat yourself. This medicine decreases your body's ability to fight infections. Try to avoid beingaround people who are sick. Avoid taking medicines that contain aspirin, acetaminophen, ibuprofen, naproxen, or ketoprofen unless instructed by your health care professional.These medicines may hide a fever. Talk to  your health care professional about your risk of cancer. You may bemore at risk for certain types of cancer if you take this medicine. If you are going to need surgery or other procedure, tell your health careprofessional that you are using this medicine. Be careful brushing or flossing your teeth or using a toothpick because you may get an infection or bleed more easily. If you have any dental work done, tellyour dentist you are receiving this medicine. What side effects may I notice from receiving this medication? Side effects that you should report to your doctor or health care professionalas soon as possible: allergic reactions like skin rash, itching or hives, swelling of the face, lips, or tongue breathing problems nausea, vomiting signs and symptoms of bleeding such as bloody or black, tarry stools; red or dark brown urine; spitting up blood or brown material that looks like coffee grounds; red spots on the skin; unusual bruising or bleeding from the eyes, gums, or nose signs and symptoms of heart failure like fast, irregular heartbeat, sudden weight gain; swelling of the ankles, feet, hands signs and symptoms of infection like fever; chills; cough; sore throat; pain or trouble passing urine signs and symptoms of kidney injury like trouble passing urine or change in the amount of urine signs and symptoms of liver injury like dark yellow or brown urine; general ill feeling or flu-like symptoms; light-colored stools; loss of appetite; nausea; right upper   belly pain; unusually weak or tired; yellowing of the eyes or skin Side effects that usually do not require medical attention (report to yourdoctor or health care professional if they continue or are bothersome): confusion decreased hearing diarrhea facial flushing hair loss headache loss of appetite missed menstrual periods signs and symptoms of low red blood cells or anemia such as unusually weak or tired; feeling faint or lightheaded;  falls skin discoloration This list may not describe all possible side effects. Call your doctor for medical advice about side effects. You may report side effects to FDA at1-800-FDA-1088. Where should I keep my medication? This drug is given in a hospital or clinic and will not be stored at home. NOTE: This sheet is a summary. It may not cover all possible information. If you have questions about this medicine, talk to your doctor, pharmacist, orhealth care provider.  2022 [employer]/Gold Standard (2019-06-16 09:53:29)  

## 2021-04-22 NOTE — Progress Notes (Signed)
Ok to treat with creatinine of 1.83 per Dr Marin Olp. dph

## 2021-04-23 LAB — IGG, IGA, IGM
IgA: 59 mg/dL — ABNORMAL LOW (ref 61–437)
IgG (Immunoglobin G), Serum: 352 mg/dL — ABNORMAL LOW (ref 603–1613)
IgM (Immunoglobulin M), Srm: 9 mg/dL — ABNORMAL LOW (ref 20–172)

## 2021-04-25 LAB — KAPPA/LAMBDA LIGHT CHAINS
Kappa free light chain: 14 mg/L (ref 3.3–19.4)
Kappa, lambda light chain ratio: 1.32 (ref 0.26–1.65)
Lambda free light chains: 10.6 mg/L (ref 5.7–26.3)

## 2021-04-25 LAB — PROTEIN ELECTROPHORESIS, SERUM
A/G Ratio: 1.7 (ref 0.7–1.7)
Albumin ELP: 3.1 g/dL (ref 2.9–4.4)
Alpha-1-Globulin: 0.2 g/dL (ref 0.0–0.4)
Alpha-2-Globulin: 0.5 g/dL (ref 0.4–1.0)
Beta Globulin: 0.8 g/dL (ref 0.7–1.3)
Gamma Globulin: 0.3 g/dL — ABNORMAL LOW (ref 0.4–1.8)
Globulin, Total: 1.8 g/dL — ABNORMAL LOW (ref 2.2–3.9)
Total Protein ELP: 4.9 g/dL — ABNORMAL LOW (ref 6.0–8.5)

## 2021-04-28 ENCOUNTER — Ambulatory Visit: Payer: Medicare Other | Admitting: Internal Medicine

## 2021-04-29 ENCOUNTER — Other Ambulatory Visit: Payer: Self-pay | Admitting: Internal Medicine

## 2021-05-13 ENCOUNTER — Other Ambulatory Visit: Payer: Self-pay

## 2021-05-13 ENCOUNTER — Inpatient Hospital Stay: Payer: Medicare Other

## 2021-05-13 ENCOUNTER — Inpatient Hospital Stay: Payer: Medicare Other | Attending: Family

## 2021-05-13 VITALS — BP 121/81 | HR 94 | Temp 98.5°F | Resp 19 | Ht 69.0 in | Wt 214.0 lb

## 2021-05-13 DIAGNOSIS — Z5111 Encounter for antineoplastic chemotherapy: Secondary | ICD-10-CM | POA: Diagnosis not present

## 2021-05-13 DIAGNOSIS — Z79899 Other long term (current) drug therapy: Secondary | ICD-10-CM | POA: Diagnosis not present

## 2021-05-13 DIAGNOSIS — C9000 Multiple myeloma not having achieved remission: Secondary | ICD-10-CM | POA: Insufficient documentation

## 2021-05-13 DIAGNOSIS — I129 Hypertensive chronic kidney disease with stage 1 through stage 4 chronic kidney disease, or unspecified chronic kidney disease: Secondary | ICD-10-CM | POA: Diagnosis not present

## 2021-05-13 DIAGNOSIS — E1122 Type 2 diabetes mellitus with diabetic chronic kidney disease: Secondary | ICD-10-CM | POA: Insufficient documentation

## 2021-05-13 DIAGNOSIS — N184 Chronic kidney disease, stage 4 (severe): Secondary | ICD-10-CM | POA: Insufficient documentation

## 2021-05-13 DIAGNOSIS — Z5112 Encounter for antineoplastic immunotherapy: Secondary | ICD-10-CM | POA: Diagnosis not present

## 2021-05-13 LAB — CBC WITH DIFFERENTIAL/PLATELET
Abs Immature Granulocytes: 0.02 10*3/uL (ref 0.00–0.07)
Basophils Absolute: 0 10*3/uL (ref 0.0–0.1)
Basophils Relative: 1 %
Eosinophils Absolute: 0.1 10*3/uL (ref 0.0–0.5)
Eosinophils Relative: 3 %
HCT: 36.1 % — ABNORMAL LOW (ref 39.0–52.0)
Hemoglobin: 11.9 g/dL — ABNORMAL LOW (ref 13.0–17.0)
Immature Granulocytes: 1 %
Lymphocytes Relative: 9 %
Lymphs Abs: 0.4 10*3/uL — ABNORMAL LOW (ref 0.7–4.0)
MCH: 29.3 pg (ref 26.0–34.0)
MCHC: 33 g/dL (ref 30.0–36.0)
MCV: 88.9 fL (ref 80.0–100.0)
Monocytes Absolute: 0.8 10*3/uL (ref 0.1–1.0)
Monocytes Relative: 18 %
Neutro Abs: 2.9 10*3/uL (ref 1.7–7.7)
Neutrophils Relative %: 68 %
Platelets: 150 10*3/uL (ref 150–400)
RBC: 4.06 MIL/uL — ABNORMAL LOW (ref 4.22–5.81)
RDW: 17.7 % — ABNORMAL HIGH (ref 11.5–15.5)
WBC: 4.3 10*3/uL (ref 4.0–10.5)
nRBC: 0 % (ref 0.0–0.2)

## 2021-05-13 LAB — COMPREHENSIVE METABOLIC PANEL
ALT: 20 U/L (ref 0–44)
AST: 24 U/L (ref 15–41)
Albumin: 3.6 g/dL (ref 3.5–5.0)
Alkaline Phosphatase: 61 U/L (ref 38–126)
Anion gap: 8 (ref 5–15)
BUN: 23 mg/dL (ref 8–23)
CO2: 24 mmol/L (ref 22–32)
Calcium: 8.8 mg/dL — ABNORMAL LOW (ref 8.9–10.3)
Chloride: 109 mmol/L (ref 98–111)
Creatinine, Ser: 1.67 mg/dL — ABNORMAL HIGH (ref 0.61–1.24)
GFR, Estimated: 44 mL/min — ABNORMAL LOW (ref >60)
Glucose, Bld: 129 mg/dL — ABNORMAL HIGH (ref 70–99)
Potassium: 3.4 mmol/L — ABNORMAL LOW (ref 3.5–5.1)
Sodium: 141 mmol/L (ref 135–145)
Total Bilirubin: 0.9 mg/dL (ref 0.3–1.2)
Total Protein: 5.7 g/dL — ABNORMAL LOW (ref 6.5–8.1)

## 2021-05-13 MED ORDER — SODIUM CHLORIDE 0.9 % IV SOLN
400.0000 mg/m2 | Freq: Once | INTRAVENOUS | Status: AC
  Administered 2021-05-13: 900 mg via INTRAVENOUS
  Filled 2021-05-13: qty 45

## 2021-05-13 MED ORDER — BORTEZOMIB CHEMO SQ INJECTION 3.5 MG (2.5MG/ML)
1.3000 mg/m2 | Freq: Once | INTRAMUSCULAR | Status: AC
  Administered 2021-05-13: 3 mg via SUBCUTANEOUS
  Filled 2021-05-13: qty 1.2

## 2021-05-13 MED ORDER — PALONOSETRON HCL INJECTION 0.25 MG/5ML
0.2500 mg | Freq: Once | INTRAVENOUS | Status: AC
  Administered 2021-05-13: 0.25 mg via INTRAVENOUS
  Filled 2021-05-13: qty 5

## 2021-05-13 MED ORDER — SODIUM CHLORIDE 0.9 % IV SOLN: Freq: Once | INTRAVENOUS | Status: AC

## 2021-05-13 MED ORDER — SODIUM CHLORIDE 0.9 % IV SOLN
20.0000 mg | Freq: Once | INTRAVENOUS | Status: AC
  Administered 2021-05-13: 20 mg via INTRAVENOUS
  Filled 2021-05-13: qty 20

## 2021-05-13 NOTE — Patient Instructions (Signed)
Bortezomib injection What is this medication? BORTEZOMIB (bor TEZ oh mib) targets proteins in cancer cells and stops thecancer cells from growing. It treats multiple myeloma and mantle cell lymphoma. This medicine may be used for other purposes; ask your health care provider orpharmacist if you have questions. COMMON BRAND NAME(S): Velcade What should I tell my care team before I take this medication? They need to know if you have any of these conditions: dehydration diabetes (high blood sugar) heart disease liver disease tingling of the fingers or toes or other nerve disorder an unusual or allergic reaction to bortezomib, mannitol, boron, other medicines, foods, dyes, or preservatives pregnant or trying to get pregnant breast-feeding How should I use this medication? This medicine is injected into a vein or under the skin. It is given by ahealth care provider in a hospital or clinic setting. Talk to your health care provider about the use of this medicine in children.Special care may be needed. Overdosage: If you think you have taken too much of this medicine contact apoison control center or emergency room at once. NOTE: This medicine is only for you. Do not share this medicine with others. What if I miss a dose? Keep appointments for follow-up doses. It is important not to miss your dose.Call your health care provider if you are unable to keep an appointment. What may interact with this medication? This medicine may interact with the following medications: ketoconazole rifampin This list may not describe all possible interactions. Give your health care provider a list of all the medicines, herbs, non-prescription drugs, or dietary supplements you use. Also tell them if you smoke, drink alcohol, or use illegaldrugs. Some items may interact with your medicine. What should I watch for while using this medication? Your condition will be monitored carefully while you are receiving  thismedicine. You may need blood work done while you are taking this medicine. You may get drowsy or dizzy. Do not drive, use machinery, or do anything that needs mental alertness until you know how this medicine affects you. Do not stand up or sit up quickly, especially if you are an older patient. Thisreduces the risk of dizzy or fainting spells This medicine may increase your risk of getting an infection. Call your health care provider for advice if you get a fever, chills, sore throat, or other symptoms of a cold or flu. Do not treat yourself. Try to avoid being aroundpeople who are sick. Check with your health care provider if you have severe diarrhea, nausea, and vomiting, or if you sweat a lot. The loss of too much body fluid may make itdangerous for you to take this medicine. Do not become pregnant while taking this medicine or for 7 months after stopping it. Women should inform their health care provider if they wish to become pregnant or think they might be pregnant. Men should not father a child while taking this medicine and for 4 months after stopping it. There is a potential for serious harm to an unborn child. Talk to your health care provider for more information. Do not breast-feed an infant while taking thismedicine or for 2 months after stopping it. This medicine may make it more difficult to get pregnant or father a child.Talk to your health care provider if you are concerned about your fertility. What side effects may I notice from receiving this medication? Side effects that you should report to your doctor or health care professionalas soon as possible: allergic reactions (skin rash; itching or hives; swelling   of the face, lips, or tongue) bleeding (bloody or black, tarry stools; red or dark brown urine; spitting up blood or brown material that looks like coffee grounds; red spots on the skin; unusual bruising or bleeding from the eye, gums, or nose) blurred vision or changes in  vision confusion constipation headache heart failure (trouble breathing; fast, irregular heartbeat; sudden weight gain; swelling of the ankles, feet, hands) infection (fever, chills, cough, sore throat, pain or trouble passing urine) lack or loss of appetite liver injury (dark yellow or brown urine; general ill feeling or flu-like symptoms; loss of appetite, right upper belly pain; yellowing of the eyes or skin) low blood pressure (dizziness; feeling faint or lightheaded, falls; unusually weak or tired) muscle cramps pain, redness, or irritation at site where injected pain, tingling, numbness in the hands or feet seizures trouble breathing unusual bruising or bleeding Side effects that usually do not require medical attention (report to yourdoctor or health care professional if they continue or are bothersome): diarrhea nausea stomach pain trouble sleeping vomiting This list may not describe all possible side effects. Call your doctor for medical advice about side effects. You may report side effects to FDA at1-800-FDA-1088. Where should I keep my medication? This medicine is given in a hospital or clinic. It will not be stored at home. NOTE: This sheet is a summary. It may not cover all possible information. If you have questions about this medicine, talk to your doctor, pharmacist, orhealth care provider.  2022 Elsevier/Gold Standard (2020-09-02 13:22:53) Cyclophosphamide Injection What is this medication? CYCLOPHOSPHAMIDE (sye kloe FOSS fa mide) is a chemotherapy drug. It slows the growth of cancer cells. This medicine is used to treat many types of cancer like lymphoma, myeloma, leukemia, breast cancer, and ovarian cancer, to name afew. This medicine may be used for other purposes; ask your health care provider orpharmacist if you have questions. COMMON BRAND NAME(S): Cytoxan, Neosar What should I tell my care team before I take this medication? They need to know if you have any  of these conditions: heart disease history of irregular heartbeat infection kidney disease liver disease low blood counts, like white cells, platelets, or red blood cells on hemodialysis recent or ongoing radiation therapy scarring or thickening of the lungs trouble passing urine an unusual or allergic reaction to cyclophosphamide, other medicines, foods, dyes, or preservatives pregnant or trying to get pregnant breast-feeding How should I use this medication? This drug is usually given as an injection into a vein or muscle or by infusion into a vein. It is administered in a hospital or clinic by a specially trainedhealth care professional. Talk to your pediatrician regarding the use of this medicine in children.Special care may be needed. Overdosage: If you think you have taken too much of this medicine contact apoison control center or emergency room at once. NOTE: This medicine is only for you. Do not share this medicine with others. What if I miss a dose? It is important not to miss your dose. Call your doctor or health careprofessional if you are unable to keep an appointment. What may interact with this medication? amphotericin B azathioprine certain antivirals for HIV or hepatitis certain medicines for blood pressure, heart disease, irregular heart beat certain medicines that treat or prevent blood clots like warfarin certain other medicines for cancer cyclosporine etanercept indomethacin medicines that relax muscles for surgery medicines to increase blood counts metronidazole This list may not describe all possible interactions. Give your health care provider a list of all  the medicines, herbs, non-prescription drugs, or dietary supplements you use. Also tell them if you smoke, drink alcohol, or use illegaldrugs. Some items may interact with your medicine. What should I watch for while using this medication? Your condition will be monitored carefully while you are  receiving thismedicine. You may need blood work done while you are taking this medicine. Drink water or other fluids as directed. Urinate often, even at night. Some products may contain alcohol. Ask your health care professional if this medicine contains alcohol. Be sure to tell all health care professionals you are taking this medicine. Certain medicines, like metronidazole and disulfiram, can cause an unpleasant reaction when taken with alcohol. The reaction includes flushing, headache, nausea, vomiting, sweating, and increased thirst. Thereaction can last from 30 minutes to several hours. Do not become pregnant while taking this medicine or for 1 year after stopping it. Women should inform their health care professional if they wish to become pregnant or think they might be pregnant. Men should not father a child while taking this medicine and for 4 months after stopping it. There is potential for serious side effects to an unborn child. Talk to your health care professionalfor more information. Do not breast-feed an infant while taking this medicine or for 1 week afterstopping it. This medicine has caused ovarian failure in some women. This medicine may make it more difficult to get pregnant. Talk to your health care professional if [place] S[place] concerned about your fertility. This medicine has caused decreased sperm counts in some men. This may make it more difficult to father a child. Talk to your health care professional if [place] concerned about your fertility. Call your health care professional for advice if you get a fever, chills, or sore throat, or other symptoms of a cold or flu. Do not treat yourself. This medicine decreases your body's ability to fight infections. Try to avoid beingaround people who are sick. Avoid taking medicines that contain aspirin, acetaminophen, ibuprofen, naproxen, or ketoprofen unless instructed by your health care professional.These medicines may hide a fever. Talk to  your health care professional about your risk of cancer. You may bemore at risk for certain types of cancer if you take this medicine. If you are going to need surgery or other procedure, tell your health careprofessional that you are using this medicine. Be careful brushing or flossing your teeth or using a toothpick because you may get an infection or bleed more easily. If you have any dental work done, Primary school teacher you are receiving this medicine. What side effects may I notice from receiving this medication? Side effects that you should report to your doctor or health care professionalas soon as possible: allergic reactions like skin rash, itching or hives, swelling of the face, lips, or tongue breathing problems nausea, vomiting signs and symptoms of bleeding such as bloody or black, tarry stools; red or dark brown urine; spitting up blood or brown material that looks like coffee grounds; red spots on the skin; unusual bruising or bleeding from the eyes, gums, or nose signs and symptoms of heart failure like fast, irregular heartbeat, sudden weight gain; swelling of the ankles, feet, hands signs and symptoms of infection like fever; chills; cough; sore throat; pain or trouble passing urine signs and symptoms of kidney injury like trouble passing urine or change in the amount of urine signs and symptoms of liver injury like dark yellow or brown urine; general ill feeling or flu-like symptoms; light-colored stools; loss of appetite; nausea; right upper  belly pain; unusually weak or tired; yellowing of the eyes or skin Side effects that usually do not require medical attention (report to yourdoctor or health care professional if they continue or are bothersome): confusion decreased hearing diarrhea facial flushing hair loss headache loss of appetite missed menstrual periods signs and symptoms of low red blood cells or anemia such as unusually weak or tired; feeling faint or lightheaded;  falls skin discoloration This list may not describe all possible side effects. Call your doctor for medical advice about side effects. You may report side effects to FDA at1-800-FDA-1088. Where should I keep my medication? This drug is given in a hospital or clinic and will not be stored at home. NOTE: This sheet is a summary. It may not cover all possible information. If you have questions about this medicine, talk to your doctor, pharmacist, orhealth care provider.  2022 Elsevier/Gold Standard (2019-06-16 09:53:29)

## 2021-05-13 NOTE — Progress Notes (Signed)
Reviewed pt labs with Dr. Marin Olp and pt ok to treat with creatinine 1.67

## 2021-05-17 ENCOUNTER — Ambulatory Visit: Payer: Medicare Other | Admitting: Podiatry

## 2021-05-24 DIAGNOSIS — Z952 Presence of prosthetic heart valve: Secondary | ICD-10-CM | POA: Diagnosis not present

## 2021-05-24 DIAGNOSIS — Z9889 Other specified postprocedural states: Secondary | ICD-10-CM | POA: Diagnosis not present

## 2021-05-24 DIAGNOSIS — Z48812 Encounter for surgical aftercare following surgery on the circulatory system: Secondary | ICD-10-CM | POA: Diagnosis not present

## 2021-05-24 DIAGNOSIS — Z8679 Personal history of other diseases of the circulatory system: Secondary | ICD-10-CM | POA: Diagnosis not present

## 2021-05-24 DIAGNOSIS — I088 Other rheumatic multiple valve diseases: Secondary | ICD-10-CM | POA: Diagnosis not present

## 2021-05-25 ENCOUNTER — Ambulatory Visit (INDEPENDENT_AMBULATORY_CARE_PROVIDER_SITE_OTHER): Payer: Medicare Other

## 2021-05-25 VITALS — Ht 69.0 in | Wt 215.0 lb

## 2021-05-25 DIAGNOSIS — Z0000 Encounter for general adult medical examination without abnormal findings: Secondary | ICD-10-CM

## 2021-05-25 NOTE — Patient Instructions (Signed)
Eric [name] , Thank you for taking time to come for your Medicare Wellness Visit. I appreciate your ongoing commitment to your health goals. Please review the following plan we discussed and let me know if I can assist you in the future.   Screening recommendations/referrals: Colonoscopy: completed 04/11/2013 Recommended yearly ophthalmology/optometry visit for glaucoma screening and checkup Recommended yearly dental visit for hygiene and checkup  Vaccinations: Influenza vaccine: due Pneumococcal vaccine: due Tdap vaccine: completed 02/24/2017, due 02/25/2027 Shingles vaccine: discussed   Covid-19:  08/23/2020, 10/30/2019, 10/09/2019  Advanced directives: Advance directive discussed with you today.   Conditions/risks identified: none  Next appointment: Follow up in one year for your annual wellness visit.   Preventive Care 69 Years and Older, Male Preventive care refers to lifestyle choices and visits with your health care provider that can promote health and wellness. What does preventive care include? A yearly physical exam. This is also called an annual well check. Dental exams once or twice a year. Routine eye exams. Ask your health care provider how often you should have your eyes checked. Personal lifestyle choices, including: Daily care of your teeth and gums. Regular physical activity. Eating a healthy diet. Avoiding tobacco and drug use. Limiting alcohol use. Practicing safe sex. Taking low doses of aspirin every day. Taking vitamin and mineral supplements as recommended by your health care provider. What happens during an annual well check? The services and screenings done by your health care provider during your annual well check will depend on your age, overall health, lifestyle risk factors, and family history of disease. Counseling  Your health care provider may ask you questions about your: Alcohol use. Tobacco use. Drug use. Emotional well-being. Home and  relationship well-being. Sexual activity. Eating habits. History of falls. Memory and ability to understand (cognition). Work and work Statistician. Screening  You may have the following tests or measurements: Height, weight, and BMI. Blood pressure. Lipid and cholesterol levels. These may be checked every 5 years, or more frequently if you are over 13 years old. Skin check. Lung cancer screening. You may have this screening every year starting at age 73 if you have a 30-pack-year history of smoking and currently smoke or have quit within the past 15 years. Fecal occult blood test (FOBT) of the stool. You may have this test every year starting at age 47. Flexible sigmoidoscopy or colonoscopy. You may have a sigmoidoscopy every 5 years or a colonoscopy every 10 years starting at age 41. Prostate cancer screening. Recommendations will vary depending on your family history and other risks. Hepatitis C blood test. Hepatitis B blood test. Sexually transmitted disease (STD) testing. Diabetes screening. This is done by checking your blood sugar (glucose) after you have not eaten for a while (fasting). You may have this done every 1-3 years. Abdominal aortic aneurysm (AAA) screening. You may need this if you are a current or former smoker. Osteoporosis. You may be screened starting at age 30 if you are at high risk. Talk with your health care provider about your test results, treatment options, and if necessary, the need for more tests. Vaccines  Your health care provider may recommend certain vaccines, such as: Influenza vaccine. This is recommended every year. Tetanus, diphtheria, and acellular pertussis (Tdap, Td) vaccine. You may need a Td booster every 10 years. Zoster vaccine. You may need this after age 39. Pneumococcal 13-valent conjugate (PCV13) vaccine. One dose is recommended after age 35. Pneumococcal polysaccharide (PPSV23) vaccine. One dose is recommended after age  64. Talk to your  health care provider about which screenings and vaccines you need and how often you need them. This information is not intended to replace advice given to you by your health care provider. Make sure you discuss any questions you have with your health care provider. Document Released: 10/08/2015 Document Revised: 05/31/2016 Document Reviewed: 07/13/2015 Elsevier Interactive Patient Education  2017 [place] Prevention in the Home Falls can cause injuries. They can happen to people of all ages. There are many things you can do to make your home safe and to help prevent falls. What can I do on the outside of my home? Regularly fix the edges of walkways and driveways and fix any cracks. Remove anything that might make you trip as you walk through a door, such as a raised step or threshold. Trim any bushes or trees on the path to your home. Use bright outdoor lighting. Clear any walking paths of anything that might make someone trip, such as rocks or tools. Regularly check to see if handrails are loose or broken. Make sure that both sides of any steps have handrails. Any raised decks and porches should have guardrails on the edges. Have any leaves, snow, or ice cleared regularly. Use sand or salt on walking paths during winter. Clean up any spills in your garage right away. This includes oil or grease spills. What can I do in the bathroom? Use night lights. Install grab bars by the toilet and in the tub and shower. Do not use towel bars as grab bars. Use non-skid mats or decals in the tub or shower. If you need to sit down in the shower, use a plastic, non-slip stool. Keep the floor dry. Clean up any water that spills on the floor as soon as it happens. Remove soap buildup in the tub or shower regularly. Attach bath mats securely with double-sided non-slip rug tape. Do not have throw rugs and other things on the floor that can make you trip. What can I do in the bedroom? Use night  lights. Make sure that you have a light by your bed that is easy to reach. Do not use any sheets or blankets that are too big for your bed. They should not hang down onto the floor. Have a firm chair that has side arms. You can use this for support while you get dressed. Do not have throw rugs and other things on the floor that can make you trip. What can I do in the kitchen? Clean up any spills right away. Avoid walking on wet floors. Keep items that you use a lot in easy-to-reach places. If you need to reach something above you, use a strong step stool that has a grab bar. Keep electrical cords out of the way. Do not use floor polish or wax that makes floors slippery. If you must use wax, use non-skid floor wax. Do not have throw rugs and other things on the floor that can make you trip. What can I do with my stairs? Do not leave any items on the stairs. Make sure that there are handrails on both sides of the stairs and use them. Fix handrails that are broken or loose. Make sure that handrails are as long as the stairways. Check any carpeting to make sure that it is firmly attached to the stairs. Fix any carpet that is loose or worn. Avoid having throw rugs at the top or bottom of the stairs. If you do have  throw rugs, attach them to the floor with carpet tape. Make sure that you have a light switch at the top of the stairs and the bottom of the stairs. If you do not have them, ask someone to add them for you. What else can I do to help prevent falls? Wear shoes that: Do not have high heels. Have rubber bottoms. Are comfortable and fit you well. Are closed at the toe. Do not wear sandals. If you use a stepladder: Make sure that it is fully opened. Do not climb a closed stepladder. Make sure that both sides of the stepladder are locked into place. Ask someone to hold it for you, if possible. Clearly mark and make sure that you can see: Any grab bars or handrails. First and last  steps. Where the edge of each step is. Use tools that help you move around (mobility aids) if they are needed. These include: Canes. Walkers. Scooters. Crutches. Turn on the lights when you go into a dark area. Replace any light bulbs as soon as they burn out. Set up your furniture so you have a clear path. Avoid moving your furniture around. If any of your floors are uneven, fix them. If there are any pets around you, be aware of where they are. Review your medicines with your doctor. Some medicines can make you feel dizzy. This can increase your chance of falling. Ask your doctor what other things that you can do to help prevent falls. This information is not intended to replace advice given to you by your health care provider. Make sure you discuss any questions you have with your health care provider. Document Released: 07/08/2009 Document Revised: 02/17/2016 Document Reviewed: 10/16/2014 Elsevier Interactive Patient Education  2017 [employer].

## 2021-05-25 NOTE — Progress Notes (Signed)
I connected with Eric [name]. today by telephone and verified that I am speaking with the correct person using two identifiers. Location patient: home Location provider: work Persons participating in the virtual visit: Eric [name]., Glenna Durand LPN.   I discussed the limitations, risks, security and privacy concerns of performing an evaluation and management service by telephone and the availability of in person appointments. I also discussed with the patient that there may be a patient responsible charge related to this service. The patient expressed understanding and verbally consented to this telephonic visit.    Interactive audio and video telecommunications were attempted between this provider and patient, however failed, due to patient having technical difficulties OR patient did not have access to video capability.  We continued and completed visit with audio only.     Vital signs may be patient reported or missing.  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  Review of Systems     Cardiac Risk Factors include: advanced age (>62mn, >>21women);hypertension;male gender;obesity (BMI >30kg/m2);sedentary lifestyle     Objective:    Today's Vitals   05/25/21 0912  Weight: 215 lb (97.5 kg)  Height: '5\' 9"'  (1.753 m)   Body mass index is 31.75 kg/m.  Advanced Directives 05/25/2021 04/22/2021 03/22/2021 02/25/2021 01/28/2021 12/31/2020 12/03/2020  Does Patient Have a Medical Advance Directive? No No No No No No No  Does patient want to make changes to medical advance directive? - - - - No - Patient declined No - Patient declined No - Patient declined  Would patient like information on creating a medical advance directive? - No - Patient declined No - Patient declined No - Patient declined No - Patient declined No - Patient declined -  Pre-existing out of facility DNR order (yellow form or pink MOST form) -  - - - - - -    Current Medications (verified) Outpatient Encounter Medications as of 05/25/2021  Medication Sig   acetaminophen (TYLENOL) 500 MG tablet Take 1,000 mg by mouth every 4 (four) hours as needed for moderate pain or headache.   amiodarone (PACERONE) 100 MG tablet TAKE 1 TABLET(100 MG) BY MOUTH DAILY. PLEASE MAKE OVERDUE APPOINTMENT WITH DOCTOR TAYLOR BEFORE ANYMORE REFILLS. THANK YOU FIRST ATTEMPT   apixaban (ELIQUIS) 5 MG TABS tablet Take 1 tablet (5 mg total) by mouth 2 (two) times daily.   carvedilol (COREG) 6.25 MG tablet TAKE 1 TABLET(6.25 MG) BY MOUTH TWICE DAILY   docusate sodium (COLACE) 100 MG capsule Take 100 mg by mouth daily as needed for mild constipation.   famciclovir (FAMVIR) 500 MG tablet Take 1 tablet (500 mg total) by mouth daily.   fluticasone (FLONASE) 50 MCG/ACT nasal spray Place 2 sprays into both nostrils daily.   furosemide (LASIX) 80 MG tablet Take 1 tablet (80 mg total) by mouth daily.   Hypromellose (ARTIFICIAL TEARS OP) Place 1 drop into both eyes daily as needed (dry eyes).   isosorbide-hydrALAZINE (BIDIL) 20-37.5 MG tablet TAKE 1 TABLET BY MOUTH THREE TIMES DAILY (Patient taking differently: Patient taking med 3 days a week or less, only taking 1 or 2 a day)   levothyroxine (SYNTHROID) 75 MCG tablet TAKE 1 TABLET BY MOUTH DAILY IN THE MORNING. EXCEPT TAKE 1/2 TABLET ON SUNDAYS IN THE MORNING   loperamide (IMODIUM) 2 MG capsule Take 2 mg by mouth as needed for diarrhea or loose stools.   ondansetron (ZOFRAN) 8 MG tablet Take  1 tablet (8 mg total) by mouth 2 (two) times daily as needed for refractory nausea / vomiting. Start on day 3 after Cytoxan.   potassium chloride 20 MEQ/15ML (10%) SOLN Take 15 mLs (20 mEq total) by mouth 2 (two) times daily. (Patient taking differently: Take 20 mEq by mouth daily.)   prochlorperazine (COMPAZINE) 10 MG tablet Take 1 tablet (10 mg total) by mouth every 6 (six) hours as needed (Nausea or vomiting).   rosuvastatin  (CRESTOR) 20 MG tablet Take 1 tablet (20 mg total) by mouth daily.   vitamin B-12 (CYANOCOBALAMIN) 100 MCG tablet Take 100 mcg by mouth daily.   vitamin C (ASCORBIC ACID) 500 MG tablet Take 500 mg by mouth daily.   VITAMIN D PO Take 1 capsule by mouth daily.   ipratropium (ATROVENT) 0.06 % nasal spray Place 2 sprays into both nostrils 2 (two) times daily. (Patient not taking: No sig reported)   No facility-administered encounter medications on file as of 05/25/2021.    Allergies (verified) Lactose intolerance (gi)   History: Past Medical History:  Diagnosis Date   Acute blood loss anemia    Acute encephalopathy    Acute hypoxemic respiratory failure (HCC)    Acute idiopathic gout of right ankle    Acute lumbar back pain    Acute on chronic systolic heart failure (HCC)    Acute respiratory failure (HCC)    AKI (acute kidney injury) ([place])    Altered mental status    Aortic aneurysm without rupture ([place]) 02/09/2016   Aortic root enlargement ([place]) 02/13/2012   Aortic valve regurgitation, acquired 02/13/2012   Arrhythmia    Atrial flutter ([place]) 03/18/2012   Back pain    Cardiac arrest ([place]) 02/01/2016   CHF (congestive heart failure) ([place])    Chronic anticoagulation 03/04/2013   Chronic kidney disease    kidney fx studies increased    Chronic lower back pain    Chronic renal insufficiency    Chronic systolic heart failure ([place]) 02/13/2012   Recent diagnosis 4 / 2013, LVEF 25% by Echo 12/2011  03/2013: Echo at [facility] Conclusions: 1. Left ventricular ejection fraction estimated by 2D at 40-45 percent. 2. Mild concentric left ventricular hypertrophy. 3. Mild left atrial enlargement. 4. Moderate aortic valve regurgitation. 5. The aortic root at the sinus(es) of valsalva is moderately dilated 6. Mild mitral valve regurgitation. 7.   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   Femoral neuropathy    Goals of care, counseling/discussion 05/05/2020   HCAP (healthcare-associated pneumonia)    Heart murmur    Hyperlipidemia 02/13/2012   Hypertension    Hypothyroidism    Internal hemorrhoids without mention of complication 0/27/7412   Labile blood pressure    Left bundle branch block 02/13/2012   Leg weakness, bilateral    Long term (current) use of anticoagulants 08/06/2013   Eliquis therapy    Long term current use of amiodarone 08/07/2016   Lower extremity weakness    Migraine 02/13/12   "opthalmic"   Multiple myeloma ([place]) 05/05/2020   Non-traumatic  rhabdomyolysis    Obesity (BMI 30-39.9) 02/13/2012   Pain    Paroxysmal atrial fibrillation (HCC)    Pneumonia    Retroperitoneal bleed    Right ankle pain    Right knee pain    Severe aortic regurgitation 02/09/2016   Special screening for malignant neoplasms, colon 04/11/2013   Thyroid activity decreased    Tibial pain    Varicose vein of leg    right   Ventricular fibrillation ([place]) 02/09/2016   Weakness of both lower extremities    Past Surgical History:  Procedure Laterality Date   AORTIC VALVE REPLACEMENT  11/20/2018   CARDIAC CATHETERIZATION N/A 02/17/2016   Procedure: Left Heart Cath and Coronary Angiography;  Surgeon: Jettie Booze, MD;  Location: [place] CV LAB;  Service: Cardiovascular;  Laterality: N/A;   CARDIOVERSION  03/22/2012   Procedure: CARDIOVERSION;  Surgeon: Candee Furbish, MD;  Location: [facility] ENDOSCOPY;  Service: Cardiovascular;  Laterality: N/A;   CARDIOVERSION  04/19/2012   Procedure: CARDIOVERSION;  Surgeon: Sinclair Grooms, MD;  Location: [place];  Service: Cardiovascular;  Laterality: N/A;   COLONOSCOPY N/A  04/11/2013   Procedure: COLONOSCOPY;  Surgeon: Inda Castle, MD;  Location: [facility] ENDOSCOPY;  Service: Endoscopy;  Laterality: N/A;   COLONOSCOPY N/A 04/11/2013   Procedure: COLONOSCOPY;  Surgeon: Inda Castle, MD;  Location: [facility] ENDOSCOPY;  Service: Endoscopy;  Laterality: N/A;   EP IMPLANTABLE DEVICE N/A 02/17/2016   Procedure: BiV ICD Insertion CRT-D;  Surgeon: Evans Lance, MD;  Location: [place] CV LAB;  Service: Cardiovascular;  Laterality: N/A;   FINGER SURGERY  2012   "4th digit right hand; thumb on left hand"   RADIOLOGY WITH ANESTHESIA N/A 02/11/2016   Procedure: MRI OF THE BRAIN WITHOUT CONTRAST, LUMBAR WITHOUT CONTRAST;  Surgeon: Medication Radiologist, MD;  Location: [place];  Service: Radiology;  Laterality: N/A;  DR. WOOD/MRI   RIGHT/LEFT HEART CATH AND CORONARY ANGIOGRAPHY N/A 07/26/2018   Procedure: RIGHT/LEFT HEART CATH AND CORONARY ANGIOGRAPHY;  Surgeon: Belva Crome, MD;  Location: [place] [facility] LAB;  Service: Cardiovascular;  Laterality: N/A;   Skin melanocytoma excision  2012   "above left clavicle"   STERNAL INCISION RECLOSURE  11/2018   STERNAL WIRE REMOVAL  11/2018   STERNAL WOUND DEBRIDEMENT  11/2018   TEE WITHOUT CARDIOVERSION  03/22/2012   Procedure: TRANSESOPHAGEAL ECHOCARDIOGRAM (TEE);  Surgeon: Candee Furbish, MD;  Location: [facility] ENDOSCOPY;  Service: Cardiovascular;  Laterality: N/A;   TEE WITHOUT CARDIOVERSION N/A 02/08/2016   Procedure: TRANSESOPHAGEAL ECHOCARDIOGRAM (TEE);  Surgeon: Lelon Perla, MD;  Location: [facility] ENDOSCOPY;  Service: Cardiovascular;  Laterality: N/A;   Family History  Problem Relation Age of Onset   Hypertension Mother    Heart disease Mother    Heart failure Mother    Diabetes Mother    Hypertension Father    Heart disease Father    Heart failure Father    Social History   Socioeconomic History   Marital status: Married    Spouse name: [name]   Number of children: 1   Years of education: Not on file   Highest education level:  Not on file  Occupational History    Employer: [employer]  Tobacco Use   Smoking status: Never   Smokeless tobacco: Never  Vaping Use   Vaping Use: Never used  Substance and Sexual Activity   Alcohol use: Yes    Comment: rarely   Drug use: No   Sexual activity:  Not Currently  Other Topics Concern   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   Social Determinants of Health   Financial Resource Strain: Low Risk    Difficulty of Paying Living Expenses: Not hard at all  Food Insecurity: No Food Insecurity   Worried About Charity fundraiser in the Last Year: Never true   [place] in the Last Year: Never true  Transportation Needs: No Transportation Needs   Lack of Transportation (Medical): No   Lack of Transportation (Non-Medical): No  Physical Activity: Inactive   Days of Exercise per Week: 0 days   Minutes of Exercise per Session: 0 min  Stress: No Stress Concern Present   Feeling of Stress : Not at all  Social Connections: Not on file    Tobacco Counseling Counseling given: Not Answered   Clinical Intake:  Pre-visit preparation completed: Yes  Pain : No/denies pain     Nutritional Status: BMI > 30  Obese Nutritional Risks: Nausea/ vomitting/ diarrhea (nausea and diarrhea after chemo therapy) Diabetes: No  How often do you need to have someone help you when you read instructions, pamphlets, or other written materials from your doctor or pharmacy?: 1 - Never What is the last grade level you completed in school?: PhD  Diabetic? Yes Nutrition Risk Assessment:  Has the patient had any N/V/D within the last 2 months?  Yes  Does the patient have any non-healing wounds?  No  Has the patient had any unintentional weight loss or weight gain?  No   Diabetes:  Is the patient diabetic?  Yes  If diabetic, was a CBG obtained today?  No  Did the patient bring in their  glucometer from home?  No  How often do you monitor your CBG's? .   Financial Strains and Diabetes Management:  Are you having any financial strains with the device, your supplies or your medication? No .  Does the patient want to be seen by Chronic Care Management for management of their diabetes?  No  Would the patient like to be referred to a Nutritionist or for Diabetic Management?  No   Diabetic Exams:  Diabetic Eye Exam: Overdue for diabetic eye exam. Pt has been advised about the importance in completing this exam. Patient advised to call and schedule an eye exam. Diabetic Foot Exam: Completed 03/08/2021   Interpreter Needed?: No  Information entered by :: NAllen LPN   Activities of Daily Living In your present state of health, do you have any difficulty performing the following activities: 05/25/2021  Hearing? N  Vision? N  Difficulty concentrating or making decisions? N  Walking or climbing stairs? Y  Dressing or bathing? N  Doing errands, shopping? N  Preparing Food and eating ? N  Using the Toilet? N  In the past six months, have you accidently leaked urine? N  Do you have problems with loss of bowel control? N  Managing your Medications? N  Managing your Finances? N  Housekeeping or managing your Housekeeping? N  Some recent data might be hidden    Patient Care Team: Glendale Chard, MD as PCP - General (Internal Medicine) Belva Crome, MD as PCP - Cardiology (Cardiology) Evans Lance, MD as PCP - Electrophysiology (Cardiology) Belva Crome, MD as Consulting Physician (Cardiology) Volanda Napoleon, MD as Medical Oncologist (Oncology)  Indicate any recent Medical Services you [phone]  have received from other than [facility] providers in the past year (date may be approximate).     Assessment:   This is a routine wellness examination for [place].  Hearing/Vision screen No results found.  Dietary issues and exercise activities discussed: Current Exercise Habits:  The patient does not participate in regular exercise at present   Goals Addressed             This Visit's Progress    Patient Stated       05/25/2021, wants to lose 15 pounds, wants abdomen to be smaller       Depression Screen PHQ 2/9 Scores 05/25/2021 04/22/2020 04/22/2020 04/09/2019 10/09/2018 12/18/2016  PHQ - 2 Score 0 0 0 0 0 0  PHQ- 9 Score - 1 - 2 - -    Fall Risk Fall Risk  05/25/2021 04/22/2020 05/13/2019 04/09/2019 10/09/2018  Falls in the past year? 0 0 0 0 0  Number falls in past yr: - - - 0 -  Injury with Fall? - - - - -  Comment - - - - -  Risk for fall due to : Medication side effect Medication side effect - Medication side effect -  Follow up Falls evaluation completed;Education provided;Falls prevention discussed Falls evaluation completed;Education provided;Falls prevention discussed - Falls evaluation completed;Falls prevention discussed -    FALL RISK PREVENTION PERTAINING TO THE HOME:  Any stairs in or around the home? Yes  If so, are there any without handrails? No  Home free of loose throw rugs in walkways, pet beds, electrical cords, etc? Yes  Adequate lighting in your home to reduce risk of falls? Yes   ASSISTIVE DEVICES UTILIZED TO PREVENT FALLS:  Life alert? No  Use of a cane, walker or w/c? No  Grab bars in the bathroom? Yes  Shower chair or bench in shower? Yes  Elevated toilet seat or a handicapped toilet? Yes   TIMED UP AND GO:  Was the test performed? No .      Cognitive Function:     6CIT Screen 05/25/2021 04/22/2020 04/09/2019  What Year? 0 points 0 points 0 points  What month? 0 points 0 points 0 points  What time? 0 points 0 points 0 points  Count back from 20 0 points 0 points 0 points  Months in reverse 0 points 0 points 2 points  Repeat phrase 4 points 4 points 0 points  Total Score '4 4 2    ' Immunizations Immunization History  Administered Date(s) Administered   Influenza, High Dose Seasonal PF 07/01/2019    Influenza,inj,Quad PF,6+ Mos 06/25/2020   Influenza-Unspecified 04/25/2018   Moderna Sars-Covid-2 Vaccination 08/23/2020   PFIZER(Purple Top)SARS-COV-2 Vaccination 10/09/2019, 10/30/2019    TDAP status: Up to date  Flu Vaccine status: Due, Education has been provided regarding the importance of this vaccine. Advised may receive this vaccine at local pharmacy or Health Dept. Aware to provide a copy of the vaccination record if obtained from local pharmacy or Health Dept. Verbalized acceptance and understanding.  Pneumococcal vaccine status: Due, Education has been provided regarding the importance of this vaccine. Advised may receive this vaccine at local pharmacy or Health Dept. Aware to provide a copy of the vaccination record if obtained from local pharmacy or Health Dept. Verbalized acceptance and understanding.  Covid-19 vaccine status: Completed vaccines  Qualifies for Shingles Vaccine? Yes   Zostavax completed No   Shingrix Completed?: No.    Education has been provided regarding the importance of this vaccine. Patient has  been advised to call insurance company to determine out of pocket expense if they have not yet received this vaccine. Advised may also receive vaccine at local pharmacy or Health Dept. Verbalized acceptance and understanding.  Screening Tests Health Maintenance  Topic Date Due   OPHTHALMOLOGY EXAM  Never done   Zoster Vaccines- Shingrix (1 of 2) Never done   PNA vac Low Risk Adult (1 of 2 - PCV13) Never done   COVID-19 Vaccine (4 - Booster) 11/22/2020   INFLUENZA VACCINE  04/25/2021   HEMOGLOBIN A1C  07/15/2021   FOOT EXAM  03/08/2022   URINE MICROALBUMIN  03/31/2022   COLONOSCOPY (Pts 45-21yr Insurance coverage will need to be confirmed)  04/12/2023   TETANUS/TDAP  02/25/2027   Hepatitis C Screening  Completed   HPV VACCINES  Aged Out    Health Maintenance  Health Maintenance Due  Topic Date Due   OPHTHALMOLOGY EXAM  Never done   Zoster Vaccines-  Shingrix (1 of 2) Never done   PNA vac Low Risk Adult (1 of 2 - PCV13) Never done   COVID-19 Vaccine (4 - Booster) 11/22/2020   INFLUENZA VACCINE  04/25/2021    Colorectal cancer screening: Type of screening: Colonoscopy. Completed 04/11/2013. Repeat every 10 years  Lung Cancer Screening: (Low Dose CT Chest recommended if Age 162-80years, 30 pack-year currently smoking OR have quit w/in 15years.) does not qualify.   Lung Cancer Screening Referral: no   Additional Screening:  Hepatitis C Screening: does qualify; Completed 02/24/2012  Vision Screening: Recommended annual ophthalmology exams for early detection of glaucoma and other disorders of the eye. Is the patient up to date with their annual eye exam?  Yes  Who is the provider or what is the name of the office in which the patient attends annual eye exams?  If pt is not established with a provider, would they like to be referred to a provider to establish care? No .   Dental Screening: Recommended annual dental exams for proper oral hygiene  Community Resource Referral / Chronic Care Management: CRR required this visit?  No   CCM required this visit?  No      Plan:     I have personally reviewed and noted the following in the patient's chart:   Medical and social history Use of alcohol, tobacco or illicit drugs  Current medications and supplements including opioid prescriptions. Patient is not currently taking opioid prescriptions. Functional ability and status Nutritional status Physical activity Advanced directives List of other physicians Hospitalizations, surgeries, and ER visits in previous 12 months Vitals Screenings to include cognitive, depression, and falls Referrals and appointments  In addition, I have reviewed and discussed with patient certain preventive protocols, quality metrics, and best practice recommendations. A written personalized care plan for preventive services as well as general preventive  health recommendations were provided to patient.     NKellie Simmering LPN   [id number]  Nurse Notes:

## 2021-06-03 ENCOUNTER — Ambulatory Visit: Payer: Medicare Other

## 2021-06-03 ENCOUNTER — Ambulatory Visit: Payer: Medicare Other | Admitting: Family

## 2021-06-03 ENCOUNTER — Other Ambulatory Visit: Payer: Medicare Other

## 2021-06-14 ENCOUNTER — Ambulatory Visit (INDEPENDENT_AMBULATORY_CARE_PROVIDER_SITE_OTHER): Payer: Medicare Other

## 2021-06-14 DIAGNOSIS — I428 Other cardiomyopathies: Secondary | ICD-10-CM | POA: Diagnosis not present

## 2021-06-14 LAB — CUP PACEART REMOTE DEVICE CHECK
Battery Remaining Longevity: 28 mo
Battery Remaining Percentage: 33 %
Battery Voltage: 2.9 V
Date Time Interrogation Session: 20220920020900
HighPow Impedance: 60 Ohm
HighPow Impedance: 60 Ohm
Implantable Lead Implant Date: 20170525
Implantable Lead Implant Date: 20170525
Implantable Lead Implant Date: 20170525
Implantable Lead Location: 753858
Implantable Lead Location: 753859
Implantable Lead Location: 753860
Implantable Lead Model: 7122
Implantable Pulse Generator Implant Date: 20170525
Lead Channel Impedance Value: 390 Ohm
Lead Channel Impedance Value: 450 Ohm
Lead Channel Impedance Value: 640 Ohm
Lead Channel Pacing Threshold Amplitude: 0.5 V
Lead Channel Pacing Threshold Amplitude: 1.25 V
Lead Channel Pacing Threshold Pulse Width: 0.5 ms
Lead Channel Pacing Threshold Pulse Width: 0.5 ms
Lead Channel Sensing Intrinsic Amplitude: 0.2 mV
Lead Channel Sensing Intrinsic Amplitude: 11.4 mV
Lead Channel Setting Pacing Amplitude: 2 V
Lead Channel Setting Pacing Amplitude: 2.5 V
Lead Channel Setting Pacing Pulse Width: 0.5 ms
Lead Channel Setting Pacing Pulse Width: 0.5 ms
Lead Channel Setting Sensing Sensitivity: 0.5 mV
Pulse Gen Serial Number: 7357926

## 2021-06-18 ENCOUNTER — Other Ambulatory Visit: Payer: Self-pay | Admitting: Internal Medicine

## 2021-06-18 MED ORDER — PREDNISONE 10 MG PO TABS: ORAL_TABLET | ORAL | 0 refills | Status: DC

## 2021-06-21 NOTE — Progress Notes (Signed)
Remote ICD transmission.   

## 2021-06-24 ENCOUNTER — Ambulatory Visit: Payer: Medicare Other

## 2021-06-24 ENCOUNTER — Encounter: Payer: Self-pay | Admitting: Hematology & Oncology

## 2021-06-24 ENCOUNTER — Inpatient Hospital Stay: Payer: Medicare Other

## 2021-06-24 ENCOUNTER — Inpatient Hospital Stay: Payer: Medicare Other | Attending: Family

## 2021-06-24 ENCOUNTER — Other Ambulatory Visit: Payer: Medicare Other

## 2021-06-24 ENCOUNTER — Inpatient Hospital Stay (HOSPITAL_BASED_OUTPATIENT_CLINIC_OR_DEPARTMENT_OTHER): Payer: Medicare Other | Admitting: Hematology & Oncology

## 2021-06-24 ENCOUNTER — Other Ambulatory Visit: Payer: Self-pay

## 2021-06-24 VITALS — BP 132/78 | HR 92 | Temp 99.6°F | Resp 16 | Wt 217.0 lb

## 2021-06-24 DIAGNOSIS — Z5112 Encounter for antineoplastic immunotherapy: Secondary | ICD-10-CM | POA: Insufficient documentation

## 2021-06-24 DIAGNOSIS — C9000 Multiple myeloma not having achieved remission: Secondary | ICD-10-CM

## 2021-06-24 DIAGNOSIS — Z5111 Encounter for antineoplastic chemotherapy: Secondary | ICD-10-CM | POA: Diagnosis not present

## 2021-06-24 DIAGNOSIS — M7989 Other specified soft tissue disorders: Secondary | ICD-10-CM | POA: Diagnosis not present

## 2021-06-24 DIAGNOSIS — Z79899 Other long term (current) drug therapy: Secondary | ICD-10-CM | POA: Insufficient documentation

## 2021-06-24 LAB — CMP (CANCER CENTER ONLY)
ALT: 37 U/L (ref 0–44)
AST: 38 U/L (ref 15–41)
Albumin: 3.4 g/dL — ABNORMAL LOW (ref 3.5–5.0)
Alkaline Phosphatase: 71 U/L (ref 38–126)
Anion gap: 7 (ref 5–15)
BUN: 20 mg/dL (ref 8–23)
CO2: 26 mmol/L (ref 22–32)
Calcium: 8.3 mg/dL — ABNORMAL LOW (ref 8.9–10.3)
Chloride: 105 mmol/L (ref 98–111)
Creatinine: 1.61 mg/dL — ABNORMAL HIGH (ref 0.61–1.24)
GFR, Estimated: 46 mL/min — ABNORMAL LOW (ref >60)
Glucose, Bld: 134 mg/dL — ABNORMAL HIGH (ref 70–99)
Potassium: 3.9 mmol/L (ref 3.5–5.1)
Sodium: 138 mmol/L (ref 135–145)
Total Bilirubin: 0.6 mg/dL (ref 0.3–1.2)
Total Protein: 5.2 g/dL — ABNORMAL LOW (ref 6.5–8.1)

## 2021-06-24 LAB — CBC WITH DIFFERENTIAL (CANCER CENTER ONLY)
Abs Immature Granulocytes: 0.01 10*3/uL (ref 0.00–0.07)
Basophils Absolute: 0 10*3/uL (ref 0.0–0.1)
Basophils Relative: 0 %
Eosinophils Absolute: 0 10*3/uL (ref 0.0–0.5)
Eosinophils Relative: 1 %
HCT: 34.8 % — ABNORMAL LOW (ref 39.0–52.0)
Hemoglobin: 11.4 g/dL — ABNORMAL LOW (ref 13.0–17.0)
Immature Granulocytes: 0 %
Lymphocytes Relative: 12 %
Lymphs Abs: 0.7 10*3/uL (ref 0.7–4.0)
MCH: 29.2 pg (ref 26.0–34.0)
MCHC: 32.8 g/dL (ref 30.0–36.0)
MCV: 89.2 fL (ref 80.0–100.0)
Monocytes Absolute: 1.3 10*3/uL — ABNORMAL HIGH (ref 0.1–1.0)
Monocytes Relative: 23 %
Neutro Abs: 3.4 10*3/uL (ref 1.7–7.7)
Neutrophils Relative %: 64 %
Platelet Count: 125 10*3/uL — ABNORMAL LOW (ref 150–400)
RBC: 3.9 MIL/uL — ABNORMAL LOW (ref 4.22–5.81)
RDW: 17.7 % — ABNORMAL HIGH (ref 11.5–15.5)
WBC Count: 5.4 10*3/uL (ref 4.0–10.5)
nRBC: 0 % (ref 0.0–0.2)

## 2021-06-24 LAB — LACTATE DEHYDROGENASE: LDH: 245 U/L — ABNORMAL HIGH (ref 98–192)

## 2021-06-24 MED ORDER — SODIUM CHLORIDE 0.9 % IV SOLN
400.0000 mg/m2 | Freq: Once | INTRAVENOUS | Status: AC
  Administered 2021-06-24: 900 mg via INTRAVENOUS
  Filled 2021-06-24: qty 45

## 2021-06-24 MED ORDER — SODIUM CHLORIDE 0.9 % IV SOLN
20.0000 mg | Freq: Once | INTRAVENOUS | Status: AC
  Administered 2021-06-24: 20 mg via INTRAVENOUS
  Filled 2021-06-24: qty 20

## 2021-06-24 MED ORDER — SODIUM CHLORIDE 0.9 % IV SOLN: Freq: Once | INTRAVENOUS | Status: AC

## 2021-06-24 MED ORDER — PALONOSETRON HCL INJECTION 0.25 MG/5ML
0.2500 mg | Freq: Once | INTRAVENOUS | Status: AC
  Administered 2021-06-24: 0.25 mg via INTRAVENOUS
  Filled 2021-06-24: qty 5

## 2021-06-24 MED ORDER — BORTEZOMIB CHEMO SQ INJECTION 3.5 MG (2.5MG/ML)
1.3000 mg/m2 | Freq: Once | INTRAMUSCULAR | Status: AC
  Administered 2021-06-24: 3 mg via SUBCUTANEOUS
  Filled 2021-06-24: qty 1.2

## 2021-06-24 NOTE — Patient Instructions (Signed)
[facility]  Discharge Instructions: Thank you for choosing [facility] to provide your oncology and hematology care.   If you have a lab appointment with the Cancer Center, please go directly to the Cancer Center and check in at the registration area.  Wear comfortable clothing and clothing appropriate for easy access to any Portacath or PICC line.   We strive to give you quality time with your provider. You may need to reschedule your appointment if you arrive late (15 or more minutes).  Arriving late affects you and other patients whose appointments are after yours.  Also, if you miss three or more appointments without notifying the office, you may be dismissed from the clinic at the provider's discretion.      For prescription refill requests, have your pharmacy contact our office and allow 72 hours for refills to be completed.    Today you received the following chemotherapy and/or immunotherapy agents Cytoxan, Velcade.      To help prevent nausea and vomiting after your treatment, we encourage you to take your nausea medication as directed.  BELOW ARE SYMPTOMS THAT SHOULD BE REPORTED IMMEDIATELY: *FEVER GREATER THAN 100.4 F (38 C) OR HIGHER *CHILLS OR SWEATING *NAUSEA AND VOMITING THAT IS NOT CONTROLLED WITH YOUR NAUSEA MEDICATION *UNUSUAL SHORTNESS OF BREATH *UNUSUAL BRUISING OR BLEEDING *URINARY PROBLEMS (pain or burning when urinating, or frequent urination) *BOWEL PROBLEMS (unusual diarrhea, constipation, pain near the anus) TENDERNESS IN MOUTH AND THROAT WITH OR WITHOUT PRESENCE OF ULCERS (sore throat, sores in mouth, or a toothache) UNUSUAL RASH, SWELLING OR PAIN  UNUSUAL VAGINAL DISCHARGE OR ITCHING   Items with * indicate a potential emergency and should be followed up as soon as possible or go to the Emergency Department if any problems should occur.  Please show the CHEMOTHERAPY ALERT CARD or IMMUNOTHERAPY ALERT CARD at check-in  to the Emergency Department and triage nurse. Should you have questions after your visit or need to cancel or reschedule your appointment, please contact [facility]  [phone] and follow the prompts.  Office hours are 8:00 a.m. to 4:30 p.m. Monday - Friday. Please note that voicemails left after 4:00 p.m. may not be returned until the following business day.  We are closed weekends and major holidays. You have access to a nurse at all times for urgent questions. Please call the main number to the clinic [phone] and follow the prompts.  For any non-urgent questions, you may also contact your provider using MyChart. We now offer e-Visits for anyone 18 and older to request care online for non-urgent symptoms. For details visit [email or web address].   Also download the MyChart app! Go to the app store, search "MyChart", open the app, select [place], and log in with your MyChart username and password.  Due to Covid, a mask is required upon entering the hospital/clinic. If you do not have a mask, one will be given to you upon arrival. For doctor visits, patients may have 1 support person aged 18 or older with them. For treatment visits, patients cannot have anyone with them due to current Covid guidelines and our immunocompromised population.  

## 2021-06-24 NOTE — Progress Notes (Signed)
Hematology and Oncology Follow Up Visit  Eric [name]., MD [id number] 06/21/1951 70 y.o. 06/24/2021   Principle Diagnosis:  IgG kappa myeloma  - 1q+   Current Therapy:        CyBorD -- s/p cycle 10 - started on 06/04/2020   Interim History:  Eric [name] is here today for follow-up and treatment.  Unfortunately, he has been quite a while since he was last treated.  He did not come in last month because his wife had dialysis and there is a conflict in his schedule.  He feels okay.  He has had no problems with nausea or vomiting.  There is no problems with neuropathy.  He is still working part-time as an Chief Strategy Officer.  There is no change in bowel or bladder habits.  He has had no problems with rashes.  Is a little bit of skin swelling.  His last myeloma studies did not show a monoclonal spike in his blood.  His IgG level was 352 mg/dL.  The Kappa light chain was 1.4 mg/dL.  Overall, I would say his performance status is ECOG 1.     Medications:  Allergies as of 06/24/2021       Reactions   Lactose Intolerance (gi) Diarrhea        Medication List        Accurate as of June 24, 2021  1:06 PM. If you have any questions, ask your nurse or doctor.          acetaminophen 500 MG tablet Commonly known as: TYLENOL Take 1,000 mg by mouth every 4 (four) hours as needed for moderate pain or headache.   amiodarone 200 MG tablet Commonly known as: PACERONE TK 1 T PO D   amiodarone 100 MG tablet Commonly known as: PACERONE TAKE 1 TABLET(100 MG) BY MOUTH DAILY. PLEASE MAKE OVERDUE APPOINTMENT WITH DOCTOR TAYLOR BEFORE ANYMORE REFILLS. THANK YOU FIRST ATTEMPT   apixaban 5 MG Tabs tablet Commonly known as: Eliquis Take 1 tablet (5 mg total) by mouth 2 (two) times daily.   ARTIFICIAL TEARS OP Place 1 drop into both eyes daily as needed (dry eyes).   carvedilol 6.25 MG tablet Commonly known as: COREG TAKE 1 TABLET(6.25 MG) BY MOUTH TWICE DAILY   docusate sodium  100 MG capsule Commonly known as: COLACE Take 100 mg by mouth daily as needed for mild constipation.   famciclovir 500 MG tablet Commonly known as: FAMVIR Take 1 tablet (500 mg total) by mouth daily.   fluticasone 50 MCG/ACT nasal spray Commonly known as: FLONASE Place 2 sprays into both nostrils daily.   furosemide 80 MG tablet Commonly known as: LASIX Take 1 tablet (80 mg total) by mouth daily.   ipratropium 0.06 % nasal spray Commonly known as: ATROVENT Place 2 sprays into both nostrils 2 (two) times daily.   isosorbide-hydrALAZINE 20-37.5 MG tablet Commonly known as: BIDIL TAKE 1 TABLET BY MOUTH THREE TIMES DAILY What changed:  how much to take how to take this when to take this additional instructions   levothyroxine 75 MCG tablet Commonly known as: SYNTHROID TAKE 1 TABLET BY MOUTH DAILY IN THE MORNING. EXCEPT TAKE 1/2 TABLET ON SUNDAYS IN THE MORNING   loperamide 2 MG capsule Commonly known as: IMODIUM Take 2 mg by mouth as needed for diarrhea or loose stools.   ondansetron 8 MG tablet Commonly known as: Zofran Take 1 tablet (8 mg total) by mouth 2 (two) times daily as needed for refractory nausea / vomiting. Start  on day 3 after Cytoxan.   potassium chloride 20 MEQ/15ML (10%) Soln Take 15 mLs (20 mEq total) by mouth 2 (two) times daily. What changed: when to take this   predniSONE 10 MG tablet Commonly known as: DELTASONE Take 4 tabs x 2 days, then 3 tabs x 2 days, then 2 tabs x 2 days, then 1 tab daily   prochlorperazine 10 MG tablet Commonly known as: COMPAZINE Take 1 tablet (10 mg total) by mouth every 6 (six) hours as needed (Nausea or vomiting).   rosuvastatin 20 MG tablet Commonly known as: CRESTOR Take 1 tablet (20 mg total) by mouth daily.   vitamin B-12 100 MCG tablet Commonly known as: CYANOCOBALAMIN Take 100 mcg by mouth daily.   vitamin C 500 MG tablet Commonly known as: ASCORBIC ACID Take 500 mg by mouth daily.   VITAMIN D PO Take  1 capsule by mouth daily.        Allergies:  Allergies  Allergen Reactions   Lactose Intolerance (Gi) Diarrhea    Past Medical History, Surgical history, Social history, and Family History were reviewed and updated.  Review of Systems: Review of Systems  Constitutional: Negative.   Eyes: Negative.   Respiratory: Negative.    Cardiovascular:  Positive for leg swelling.  Gastrointestinal: Negative.   Genitourinary: Negative.   Musculoskeletal: Negative.   Skin: Negative.   Neurological: Negative.   Endo/Heme/Allergies: Negative.   Psychiatric/Behavioral: Negative.      Physical Exam:  weight is 217 lb (98.4 kg). His oral temperature is 99.6 F (37.6 C). His blood pressure is 132/78 and his pulse is 92. His respiration is 16 and oxygen saturation is 98%.   Wt Readings from Last 3 Encounters:  06/24/21 217 lb (98.4 kg)  05/25/21 215 lb (97.5 kg)  05/13/21 214 lb (97.1 kg)    Physical Exam Vitals reviewed.  HENT:     Head: Normocephalic and atraumatic.  Eyes:     Pupils: Pupils are equal, round, and reactive to light.  Cardiovascular:     Rate and Rhythm: Normal rate and regular rhythm.     Heart sounds: Normal heart sounds.  Pulmonary:     Effort: Pulmonary effort is normal.     Breath sounds: Normal breath sounds.  Abdominal:     General: Bowel sounds are normal.     Palpations: Abdomen is soft.  Musculoskeletal:        General: No tenderness or deformity. Normal range of motion.     Cervical back: Normal range of motion.  Lymphadenopathy:     Cervical: No cervical adenopathy.  Skin:    General: Skin is warm and dry.     Findings: No erythema or rash.  Neurological:     Mental Status: He is alert and oriented to person, place, and time.  Psychiatric:        Behavior: Behavior normal.        Thought Content: Thought content normal.        Judgment: Judgment normal.      Lab Results  Component Value Date   WBC 5.4 06/24/2021   HGB 11.4 (L)  06/24/2021   HCT 34.8 (L) 06/24/2021   MCV 89.2 06/24/2021   PLT 125 (L) 06/24/2021   Lab Results  Component Value Date   FERRITIN 77 08/11/2020   IRON 39 (L) 08/11/2020   TIBC 400 08/11/2020   UIBC 361 08/11/2020   IRONPCTSAT 10 (L) 08/11/2020   Lab Results  Component Value Date  RETICCTPCT 0.4 08/11/2020   RBC 3.90 (L) 06/24/2021   Lab Results  Component Value Date   KPAFRELGTCHN 14.0 04/22/2021   LAMBDASER 10.6 04/22/2021   KAPLAMBRATIO 1.32 04/22/2021   Lab Results  Component Value Date   IGGSERUM 352 (L) 04/22/2021   IGA 59 (L) 04/22/2021   IGMSERUM 9 (L) 04/22/2021   Lab Results  Component Value Date   TOTALPROTELP 4.9 (L) 04/22/2021   ALBUMINELP 3.1 04/22/2021   A1GS 0.2 04/22/2021   A2GS 0.5 04/22/2021   BETS 0.8 04/22/2021   GAMS 0.3 (L) 04/22/2021   MSPIKE Not Observed 04/22/2021   SPEI Comment 04/22/2021     Chemistry      Component Value Date/Time   NA 138 06/24/2021 1148   NA 146 (H) 03/31/2021 1707   K 3.9 06/24/2021 1148   CL 105 06/24/2021 1148   CO2 26 06/24/2021 1148   BUN 20 06/24/2021 1148   BUN 31 (H) 03/31/2021 1707   CREATININE 1.61 (H) 06/24/2021 1148   CREATININE 1.95 (H) 09/08/2016 0859      Component Value Date/Time   CALCIUM 8.3 (L) 06/24/2021 1148   ALKPHOS 71 06/24/2021 1148   AST 38 06/24/2021 1148   ALT 37 06/24/2021 1148   BILITOT 0.6 06/24/2021 1148       Impression and Plan: Eric [name] is a very nice 70 yo African American gentleman with IgG kappa myeloma.    He does not wish to have a stem cell transplant.  I realize this is a "gray area" given his age and overall performance status.  He really wants to make sure that his wife is taking care of.  Thankfully he has responded very nicely to treatment.  Hopefully, we can accommodate his work schedule and is scheduled for taking care of his wife.  I think were doing his treatments every 3 weeks.  Again, he is done quite nicely.  I think we have the flexibility  to be able to arrange his treatments to make his schedule and to make his life easier.  I will plan to see him back in another 6 weeks or so.     Volanda Napoleon, MD 9/30/20221:06 PM

## 2021-06-24 NOTE — Progress Notes (Signed)
OK to treat with creat-1.61 per order of Dr. Marin Olp.

## 2021-06-26 ENCOUNTER — Other Ambulatory Visit: Payer: Self-pay | Admitting: Internal Medicine

## 2021-06-26 LAB — IGG, IGA, IGM
IgA: 69 mg/dL (ref 61–437)
IgG (Immunoglobin G), Serum: 399 mg/dL — ABNORMAL LOW (ref 603–1613)
IgM (Immunoglobulin M), Srm: 10 mg/dL — ABNORMAL LOW (ref 20–172)

## 2021-06-26 MED ORDER — AMOXICILLIN-POT CLAVULANATE 875-125 MG PO TABS
1.0000 | ORAL_TABLET | Freq: Two times a day (BID) | ORAL | 0 refills | Status: DC
Start: 2021-06-26 — End: 2021-09-11

## 2021-06-27 ENCOUNTER — Other Ambulatory Visit (INDEPENDENT_AMBULATORY_CARE_PROVIDER_SITE_OTHER): Payer: Medicare Other

## 2021-06-27 ENCOUNTER — Other Ambulatory Visit: Payer: Medicare Other

## 2021-06-27 DIAGNOSIS — R0981 Nasal congestion: Secondary | ICD-10-CM | POA: Diagnosis not present

## 2021-06-27 LAB — KAPPA/LAMBDA LIGHT CHAINS
Kappa free light chain: 18.4 mg/L (ref 3.3–19.4)
Kappa, lambda light chain ratio: 1.12 (ref 0.26–1.65)
Lambda free light chains: 16.4 mg/L (ref 5.7–26.3)

## 2021-06-27 LAB — POC COVID19 BINAXNOW: SARS Coronavirus 2 Ag: NEGATIVE

## 2021-06-27 NOTE — Addendum Note (Signed)
Addended by: Blanca Friend on: 06/27/2021 04:01 PM   Modules accepted: Orders

## 2021-06-28 LAB — PROTEIN ELECTROPHORESIS, SERUM, WITH REFLEX
A/G Ratio: 1.5 (ref 0.7–1.7)
Albumin ELP: 2.9 g/dL (ref 2.9–4.4)
Alpha-1-Globulin: 0.2 g/dL (ref 0.0–0.4)
Alpha-2-Globulin: 0.5 g/dL (ref 0.4–1.0)
Beta Globulin: 0.8 g/dL (ref 0.7–1.3)
Gamma Globulin: 0.3 g/dL — ABNORMAL LOW (ref 0.4–1.8)
Globulin, Total: 1.9 g/dL — ABNORMAL LOW (ref 2.2–3.9)
Total Protein ELP: 4.8 g/dL — ABNORMAL LOW (ref 6.0–8.5)

## 2021-06-28 LAB — NOVEL CORONAVIRUS, NAA: SARS-CoV-2, NAA: NOT DETECTED

## 2021-06-28 LAB — SARS-COV-2, NAA 2 DAY TAT

## 2021-07-15 ENCOUNTER — Inpatient Hospital Stay: Payer: Medicare Other

## 2021-07-15 ENCOUNTER — Inpatient Hospital Stay: Payer: Medicare Other | Attending: Family

## 2021-07-15 ENCOUNTER — Other Ambulatory Visit: Payer: Self-pay

## 2021-07-15 VITALS — BP 131/79 | HR 70 | Temp 98.9°F | Resp 18

## 2021-07-15 DIAGNOSIS — Z5111 Encounter for antineoplastic chemotherapy: Secondary | ICD-10-CM | POA: Diagnosis not present

## 2021-07-15 DIAGNOSIS — Z5112 Encounter for antineoplastic immunotherapy: Secondary | ICD-10-CM | POA: Insufficient documentation

## 2021-07-15 DIAGNOSIS — C9000 Multiple myeloma not having achieved remission: Secondary | ICD-10-CM | POA: Insufficient documentation

## 2021-07-15 DIAGNOSIS — Z79899 Other long term (current) drug therapy: Secondary | ICD-10-CM | POA: Diagnosis not present

## 2021-07-15 DIAGNOSIS — C9001 Multiple myeloma in remission: Secondary | ICD-10-CM

## 2021-07-15 DIAGNOSIS — M7989 Other specified soft tissue disorders: Secondary | ICD-10-CM | POA: Insufficient documentation

## 2021-07-15 LAB — CBC WITH DIFFERENTIAL (CANCER CENTER ONLY)
Abs Immature Granulocytes: 0.01 10*3/uL (ref 0.00–0.07)
Basophils Absolute: 0 10*3/uL (ref 0.0–0.1)
Basophils Relative: 1 %
Eosinophils Absolute: 0 10*3/uL (ref 0.0–0.5)
Eosinophils Relative: 1 %
HCT: 34.5 % — ABNORMAL LOW (ref 39.0–52.0)
Hemoglobin: 11.1 g/dL — ABNORMAL LOW (ref 13.0–17.0)
Immature Granulocytes: 0 %
Lymphocytes Relative: 9 %
Lymphs Abs: 0.4 10*3/uL — ABNORMAL LOW (ref 0.7–4.0)
MCH: 29.3 pg (ref 26.0–34.0)
MCHC: 32.2 g/dL (ref 30.0–36.0)
MCV: 91 fL (ref 80.0–100.0)
Monocytes Absolute: 1.3 10*3/uL — ABNORMAL HIGH (ref 0.1–1.0)
Monocytes Relative: 30 %
Neutro Abs: 2.6 10*3/uL (ref 1.7–7.7)
Neutrophils Relative %: 59 %
Platelet Count: 167 10*3/uL (ref 150–400)
RBC: 3.79 MIL/uL — ABNORMAL LOW (ref 4.22–5.81)
RDW: 17.8 % — ABNORMAL HIGH (ref 11.5–15.5)
WBC Count: 4.4 10*3/uL (ref 4.0–10.5)
nRBC: 0 % (ref 0.0–0.2)

## 2021-07-15 LAB — CMP (CANCER CENTER ONLY)
ALT: 16 U/L (ref 0–44)
AST: 21 U/L (ref 15–41)
Albumin: 3.3 g/dL — ABNORMAL LOW (ref 3.5–5.0)
Alkaline Phosphatase: 62 U/L (ref 38–126)
Anion gap: 9 (ref 5–15)
BUN: 28 mg/dL — ABNORMAL HIGH (ref 8–23)
CO2: 25 mmol/L (ref 22–32)
Calcium: 8.8 mg/dL — ABNORMAL LOW (ref 8.9–10.3)
Chloride: 109 mmol/L (ref 98–111)
Creatinine: 1.72 mg/dL — ABNORMAL HIGH (ref 0.61–1.24)
GFR, Estimated: 42 mL/min — ABNORMAL LOW (ref >60)
Glucose, Bld: 124 mg/dL — ABNORMAL HIGH (ref 70–99)
Potassium: 3.6 mmol/L (ref 3.5–5.1)
Sodium: 143 mmol/L (ref 135–145)
Total Bilirubin: 0.6 mg/dL (ref 0.3–1.2)
Total Protein: 5.4 g/dL — ABNORMAL LOW (ref 6.5–8.1)

## 2021-07-15 MED ORDER — SODIUM CHLORIDE 0.9% FLUSH
10.0000 mL | INTRAVENOUS | Status: DC | PRN
Start: 2021-07-15 — End: 2021-07-15

## 2021-07-15 MED ORDER — BORTEZOMIB CHEMO SQ INJECTION 3.5 MG (2.5MG/ML)
1.3000 mg/m2 | Freq: Once | INTRAMUSCULAR | Status: AC
  Administered 2021-07-15: 3 mg via SUBCUTANEOUS
  Filled 2021-07-15: qty 1.2

## 2021-07-15 MED ORDER — SODIUM CHLORIDE 0.9 % IV SOLN
400.0000 mg/m2 | Freq: Once | INTRAVENOUS | Status: AC
  Administered 2021-07-15: 900 mg via INTRAVENOUS
  Filled 2021-07-15: qty 30

## 2021-07-15 MED ORDER — HEPARIN SOD (PORK) LOCK FLUSH 100 UNIT/ML IV SOLN: 500.0000 [IU] | Freq: Once | INTRAVENOUS | Status: DC | PRN

## 2021-07-15 MED ORDER — PALONOSETRON HCL INJECTION 0.25 MG/5ML
0.2500 mg | Freq: Once | INTRAVENOUS | Status: AC
  Administered 2021-07-15: 0.25 mg via INTRAVENOUS
  Filled 2021-07-15: qty 5

## 2021-07-15 MED ORDER — SODIUM CHLORIDE 0.9 % IV SOLN: Freq: Once | INTRAVENOUS | Status: AC

## 2021-07-15 MED ORDER — SODIUM CHLORIDE 0.9 % IV SOLN
20.0000 mg | Freq: Once | INTRAVENOUS | Status: AC
  Administered 2021-07-15: 20 mg via INTRAVENOUS
  Filled 2021-07-15: qty 20

## 2021-07-15 NOTE — Progress Notes (Signed)
Wed cbc and cmet ok to treat despite counts

## 2021-07-29 ENCOUNTER — Other Ambulatory Visit: Payer: Self-pay

## 2021-07-29 ENCOUNTER — Encounter: Payer: Self-pay | Admitting: Podiatry

## 2021-07-29 ENCOUNTER — Ambulatory Visit (INDEPENDENT_AMBULATORY_CARE_PROVIDER_SITE_OTHER): Payer: Medicare Other | Admitting: Podiatry

## 2021-07-29 DIAGNOSIS — D689 Coagulation defect, unspecified: Secondary | ICD-10-CM | POA: Diagnosis not present

## 2021-07-29 DIAGNOSIS — M79675 Pain in left toe(s): Secondary | ICD-10-CM

## 2021-07-29 DIAGNOSIS — L608 Other nail disorders: Secondary | ICD-10-CM

## 2021-07-29 DIAGNOSIS — E119 Type 2 diabetes mellitus without complications: Secondary | ICD-10-CM | POA: Diagnosis not present

## 2021-07-29 DIAGNOSIS — M79674 Pain in right toe(s): Secondary | ICD-10-CM | POA: Diagnosis not present

## 2021-07-29 DIAGNOSIS — B351 Tinea unguium: Secondary | ICD-10-CM

## 2021-07-29 NOTE — Progress Notes (Signed)
This patient returns to my office for at risk foot care.  This patient requires this care by a professional since this patient will be at risk due to having diabetes type 2, chronic kidney disease and coagulation defect.  Patient is taking eliquisThis patient is unable to cut nails himself since the patient cannot reach his nails.These nails are painful walking and wearing shoes.  This patient presents for at risk foot care today.  General Appearance  Alert, conversant and in no acute stress.  Vascular  Dorsalis pedis and posterior tibial  pulses are palpable  bilaterally.  Capillary return is within normal limits  bilaterally. Temperature is within normal limits  bilaterally.  Neurologic  Senn-Weinstein monofilament wire test within normal limits  bilaterally. Muscle power within normal limits bilaterally.  Nails Thick disfigured discolored nails with subungual debris  from hallux to fifth toes bilaterally. No evidence of bacterial infection or drainage bilaterally.  Orthopedic  No limitations of motion  feet .  No crepitus or effusions noted.  No bony pathology or digital deformities noted.  Skin  normotropic skin with no porokeratosis noted bilaterally.  No signs of infections or ulcers noted.     Onychomycosis  Pain in right toes  Pain in left toes  Consent was obtained for treatment procedures.   Mechanical debridement of nails 1-5  bilaterally performed with a nail nipper.  Filed with dremel without incident.    Return office visit   10 weeks                   Told patient to return for periodic foot care and evaluation due to potential at risk complications.   Adrine Hayworth DPM  

## 2021-08-03 ENCOUNTER — Other Ambulatory Visit: Payer: Self-pay | Admitting: Internal Medicine

## 2021-08-03 ENCOUNTER — Telehealth: Payer: Self-pay

## 2021-08-03 MED ORDER — PREDNISONE 20 MG PO TABS: ORAL_TABLET | ORAL | 0 refills | Status: DC

## 2021-08-03 NOTE — Telephone Encounter (Signed)
Patient called stating his gout flare is not better. Dr.Sanders has spoke with patient and has sent in a prescription for him. YL,RMA

## 2021-08-05 ENCOUNTER — Other Ambulatory Visit: Payer: Medicare Other

## 2021-08-05 ENCOUNTER — Ambulatory Visit: Payer: Medicare Other

## 2021-08-10 ENCOUNTER — Ambulatory Visit (INDEPENDENT_AMBULATORY_CARE_PROVIDER_SITE_OTHER): Payer: Medicare Other | Admitting: Nurse Practitioner

## 2021-08-10 ENCOUNTER — Other Ambulatory Visit: Payer: Self-pay

## 2021-08-10 ENCOUNTER — Other Ambulatory Visit: Payer: Self-pay | Admitting: Interventional Cardiology

## 2021-08-10 VITALS — BP 122/70 | HR 70 | Temp 98.2°F | Ht 69.0 in | Wt 215.2 lb

## 2021-08-10 DIAGNOSIS — E032 Hypothyroidism due to medicaments and other exogenous substances: Secondary | ICD-10-CM | POA: Diagnosis not present

## 2021-08-10 DIAGNOSIS — E6609 Other obesity due to excess calories: Secondary | ICD-10-CM

## 2021-08-10 DIAGNOSIS — E1122 Type 2 diabetes mellitus with diabetic chronic kidney disease: Secondary | ICD-10-CM | POA: Diagnosis not present

## 2021-08-10 DIAGNOSIS — Z79899 Other long term (current) drug therapy: Secondary | ICD-10-CM

## 2021-08-10 DIAGNOSIS — N1832 Chronic kidney disease, stage 3b: Secondary | ICD-10-CM

## 2021-08-10 DIAGNOSIS — M79672 Pain in left foot: Secondary | ICD-10-CM | POA: Diagnosis not present

## 2021-08-10 DIAGNOSIS — Z6831 Body mass index (BMI) 31.0-31.9, adult: Secondary | ICD-10-CM | POA: Diagnosis not present

## 2021-08-10 DIAGNOSIS — I129 Hypertensive chronic kidney disease with stage 1 through stage 4 chronic kidney disease, or unspecified chronic kidney disease: Secondary | ICD-10-CM | POA: Diagnosis not present

## 2021-08-10 MED ORDER — ALLOPURINOL 100 MG PO TABS: ORAL_TABLET | ORAL | 1 refills | Status: DC

## 2021-08-10 MED ORDER — TRIAMCINOLONE ACETONIDE 40 MG/ML IJ SUSP
40.0000 mg | Freq: Once | INTRAMUSCULAR | Status: AC
  Administered 2021-08-10: 40 mg via INTRAMUSCULAR

## 2021-08-10 NOTE — Patient Instructions (Addendum)
Gout Gout is painful swelling of your joints. Gout is a type of arthritis. It is caused by having too much uric acid in your body. Uric acid is a chemical that is made when your body breaks down substances called purines. If your body has too much uric acid, sharp crystals can form and build up in your joints. This causes pain and swelling. Gout attacks can happen quickly and be very painful (acute gout). Over time, the attacks can affect more joints and happen more often (chronic gout). What are the causes? Too much uric acid in your blood. This can happen because: Your kidneys do not remove enough uric acid from your blood. Your body makes too much uric acid. You eat too many foods that are high in purines. These foods include organ meats, some seafood, and beer. Trauma or stress. What increases the risk? Having a family history of gout. Being male and middle-aged. Being male and having gone through menopause. Being very overweight (obese). Drinking alcohol, especially beer. Not having enough water in the body (being dehydrated). Losing weight too quickly. Having an organ transplant. Having lead poisoning. Taking certain medicines. Having kidney disease. Having a skin condition called psoriasis. What are the signs or symptoms? An attack of acute gout usually happens in just one joint. The most common place is the big toe. Attacks often start at night. Other joints that may be affected include joints of the feet, ankle, knee, fingers, wrist, or elbow. Symptoms of an attack may include: Very bad pain. Warmth. Swelling. Stiffness. Shiny, red, or purple skin. Tenderness. The affected joint may be very painful to touch. Chills and fever. Chronic gout may cause symptoms more often. More joints may be involved. You may also have white or yellow lumps (tophi) on your hands or feet or in other areas near your joints. How is this treated? Treatment for this condition has two phases:  treating an acute attack and preventing future attacks. Acute gout treatment may include: NSAIDs. Steroids. These are taken by mouth or injected into a joint. Colchicine. This medicine relieves pain and swelling. It can be given by mouth or through an IV tube. Preventive treatment may include: Taking small doses of NSAIDs or colchicine daily. Using a medicine that reduces uric acid levels in your blood. Making changes to your diet. You may need to see a food expert (dietitian) about what to eat and drink to prevent gout. Follow these instructions at home: During a gout attack  If told, put ice on the painful area: Put ice in a plastic bag. Place a towel between your skin and the bag. Leave the ice on for 20 minutes, 2-3 times a day. Raise (elevate) the painful joint above the level of your heart as often as you can. Rest the joint as much as possible. If the joint is in your leg, you may be given crutches. Follow instructions from your doctor about what you cannot eat or drink. Avoiding future gout attacks Eat a low-purine diet. Avoid foods and drinks such as: Liver. Kidney. Anchovies. Asparagus. Herring. Mushrooms. Mussels. Beer. Stay at a healthy weight. If you want to lose weight, talk with your doctor. Do not lose weight too fast. Start or continue an exercise plan as told by your doctor. Eating and drinking Drink enough fluids to keep your pee (urine) pale yellow. If you drink alcohol: Limit how much you use to: 0-1 drink a day for women. 0-2 drinks a day for men. Be aware of   how much alcohol is in your drink. In the [place] one drink equals one 12 oz bottle of beer (355 mL), one 5 oz glass of wine (148 mL), or one 1 oz glass of hard liquor (44 mL). General instructions Take over-the-counter and prescription medicines only as told by your doctor. Do not drive or use heavy machinery while taking prescription pain medicine. Return to your normal activities as told by your  doctor. Ask your doctor what activities are safe for you. Keep all follow-up visits as told by your doctor. This is important. Contact a doctor if: You have another gout attack. You still have symptoms of a gout attack after 10 days of treatment. You have problems (side effects) because of your medicines. You have chills or a fever. You have burning pain when you pee (urinate). You have pain in your lower back or belly. Get help right away if: You have very bad pain. Your pain cannot be controlled. You cannot pee. Summary Gout is painful swelling of the joints. The most common site of pain is the big toe, but it can affect other joints. Medicines and avoiding some foods can help to prevent and treat gout attacks. This information is not intended to replace advice given to you by your health care provider. Make sure you discuss any questions you have with your health care provider. Document Revised: 03/22/2018 Document Reviewed: 04/03/2018 Elsevier Patient Education  [place] Pain Many things can cause foot pain. Some common causes are: An injury. A sprain. Arthritis. Blisters. Bunions. Follow these instructions at home: Managing pain, stiffness, and swelling If directed, put ice on the painful area: Put ice in a plastic bag. Place a towel between your skin and the bag. Leave the ice on for 20 minutes, 2-3 times a day.  Activity Do not stand or walk for long periods. Return to your normal activities as told by your health care provider. Ask your health care provider what activities are safe for you. Do stretches to relieve foot pain and stiffness as told by your health care provider. Do not lift anything that is heavier than 10 lb (4.5 kg), or the limit that you are told, until your health care provider says that it is safe. Lifting a lot of weight can put added pressure on your feet. Lifestyle Wear comfortable, supportive shoes that fit you well. Do not wear  high heels. Keep your feet clean and dry. General instructions Take over-the-counter and prescription medicines only as told by your health care provider. Rub your foot gently. Pay attention to any changes in your symptoms. Keep all follow-up visits as told by your health care provider. This is important. Contact a health care provider if: Your pain does not get better after a few days of self-care. Your pain gets worse. You cannot stand on your foot. Get help right away if: Your foot is numb or tingling. Your foot or toes are swollen. Your foot or toes turn white or blue. You have warmth and redness along your foot. Summary Common causes of foot pain are injury, sprain, arthritis, blisters, or bunions. Ice, medicines, and comfortable shoes may help foot pain. Contact your health care provider if your pain does not get better after a few days of self-care. This information is not intended to replace advice given to you by your health care provider. Make sure you discuss any questions you have with your health care provider. Document Revised: 12/15/2020 Document Reviewed: 12/15/2020 Elsevier Patient Education  2022 [place].    I have prescribed allopurinol, do not take until you are feeling better. Be sure to stay well hydrated with water as well

## 2021-08-10 NOTE — Progress Notes (Signed)
I,Tianna Badgett,acting as a Education administrator for [employer], FNP.,have documented all relevant documentation on the behalf of Eric Brine, FNP,as directed by  Eric Brine, FNP while in the presence of Eric [name], [place].  This visit occurred during the SARS-CoV-2 public health emergency.  Safety protocols were in place, including screening questions prior to the visit, additional usage of staff PPE, and extensive cleaning of exam room while observing appropriate contact time as indicated for disinfecting solutions.  Subjective:     Patient ID: Eric [name]., MD , male    DOB: 20-Aug-1951 , 70 y.o.   MRN: [id number]   Chief Complaint  Patient presents with   Foot Pain    HPI  Patient is here for foot pain. When in bed with no weight on it he is comfortable. He has switched to some soft shoes due to the discomfort. Most recent exacerbation was in July. He went to the emergency room due to the pain was unsure if gout flare or fracture, treated for gout. He had been on a taper prior to having another his most recent taper. He has multiple myeloma and tomorrow he is to receive treatment he is currently in remission and will have treatment once every 2 weeks or once a month  Foot Pain This is a new problem. The current episode started 1 to 4 weeks ago (2 weeks). The problem occurs intermittently. The problem has been gradually worsening. Pertinent negatives include no abdominal pain, chills, congestion, fatigue or rash.    Past Medical History:  Diagnosis Date   Acute blood loss anemia    Acute encephalopathy    Acute hypoxemic respiratory failure (HCC)    Acute idiopathic gout of right ankle    Acute lumbar back pain    Acute on chronic systolic heart failure (HCC)    Acute respiratory failure (HCC)    AKI (acute kidney injury) ([place])    Altered mental status    Aortic aneurysm without rupture ([place]) 02/09/2016   Aortic root enlargement ([place]) 02/13/2012   Aortic valve regurgitation, acquired  02/13/2012   Arrhythmia    Atrial flutter ([place]) 03/18/2012   Back pain    Cardiac arrest ([place]) 02/01/2016   CHF (congestive heart failure) ([place])    Chronic anticoagulation 03/04/2013   Chronic kidney disease    kidney fx studies increased    Chronic lower back pain    Chronic renal insufficiency    Chronic systolic heart failure ([place]) 02/13/2012   Recent diagnosis 4 / 2013, LVEF 25% by Echo 12/2011  03/2013: Echo at [facility] Conclusions: 1. Left ventricular ejection fraction estimated by 2D at 40-45 percent. 2. Mild concentric left ventricular hypertrophy. 3. Mild left atrial enlargement. 4. Moderate aortic valve regurgitation. 5. The aortic root at the sinus(es) of valsalva is moderately dilated 6. Mild mitral valve regurgitation. 7.   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    Goals of care, counseling/discussion 05/05/2020   HCAP (healthcare-associated pneumonia)    Heart murmur    Hyperlipidemia 02/13/2012  Hypertension    Hypothyroidism    Internal hemorrhoids without mention of complication 3/61/4431   Labile blood pressure    Left bundle branch block 02/13/2012   Leg weakness, bilateral    Long term (current) use of anticoagulants 08/06/2013   Eliquis therapy    Long term current use of amiodarone 08/07/2016   Lower extremity weakness    Migraine 02/13/12   "opthalmic"   Multiple myeloma ([place]) 05/05/2020   Non-traumatic rhabdomyolysis    Obesity (BMI 30-39.9) 02/13/2012   Pain    Paroxysmal atrial fibrillation (HCC)    Pneumonia    Retroperitoneal bleed    Right ankle pain     Right knee pain    Severe aortic regurgitation 02/09/2016   Special screening for malignant neoplasms, colon 04/11/2013   Thyroid activity decreased    Tibial pain    Varicose vein of leg    right   Ventricular fibrillation ([place]) 02/09/2016   Weakness of both lower extremities      Family History  Problem Relation Age of Onset   Hypertension Mother    Heart disease Mother    Heart failure Mother    Diabetes Mother    Hypertension Father    Heart disease Father    Heart failure Father      Current Outpatient Medications:    allopurinol (ZYLOPRIM) 100 MG tablet, Take 1 tab by mouth every other day, Disp: 45 tablet, Rfl: 1   acetaminophen (TYLENOL) 500 MG tablet, Take 1,000 mg by mouth every 4 (four) hours as needed for moderate pain or headache., Disp: , Rfl:    amiodarone (PACERONE) 100 MG tablet, TAKE 1 TABLET(100 MG) BY MOUTH DAILY. PLEASE MAKE OVERDUE APPOINTMENT WITH DOCTOR TAYLOR BEFORE ANYMORE REFILLS. THANK YOU FIRST ATTEMPT, Disp: 90 tablet, Rfl: 3   amiodarone (PACERONE) 200 MG tablet, TK 1 T PO D, Disp: , Rfl:    apixaban (ELIQUIS) 5 MG TABS tablet, Take 1 tablet (5 mg total) by mouth 2 (two) times daily., Disp: 60 tablet, Rfl: 5   carvedilol (COREG) 6.25 MG tablet, TAKE 1 TABLET(6.25 MG) BY MOUTH TWICE DAILY, Disp: 180 tablet, Rfl: 1   docusate sodium (COLACE) 100 MG capsule, Take 100 mg by mouth daily as needed for mild constipation., Disp: , Rfl:    famciclovir (FAMVIR) 500 MG tablet, Take 1 tablet (500 mg total) by mouth daily., Disp: 30 tablet, Rfl: 3   fluticasone (FLONASE) 50 MCG/ACT nasal spray, Place 2 sprays into both nostrils daily., Disp: 16 g, Rfl: 2   furosemide (LASIX) 80 MG tablet, Take 1 tablet (80 mg total) by mouth daily., Disp: 90 tablet, Rfl: 3   Hypromellose (ARTIFICIAL TEARS OP), Place 1 drop into both eyes daily as needed (dry eyes)., Disp: , Rfl:    ipratropium (ATROVENT) 0.06 % nasal spray, Place 2 sprays into both nostrils 2 (two) times daily.,  Disp: 15 mL, Rfl: 3   isosorbide-hydrALAZINE (BIDIL) 20-37.5 MG tablet, TAKE 1 TABLET BY MOUTH THREE TIMES DAILY (Patient taking differently: Patient taking med 3 days a week or less, only taking 1 or 2 a day), Disp: 270 tablet, Rfl: 3   levothyroxine (SYNTHROID) 75 MCG tablet, TAKE 1 TABLET BY MOUTH DAILY IN THE MORNING. EXCEPT TAKE 1/2 TABLET ON SUNDAYS IN THE MORNING, Disp: 90 tablet, Rfl: 0   loperamide (IMODIUM) 2 MG capsule, Take 2 mg by mouth as needed for diarrhea or loose stools., Disp: , Rfl:    ondansetron (ZOFRAN) 8 MG tablet, Take  1 tablet (8 mg total) by mouth 2 (two) times daily as needed for refractory nausea / vomiting. Start on day 3 after Cytoxan., Disp: 30 tablet, Rfl: 1   potassium chloride 20 MEQ/15ML (10%) SOLN, TAKE 15 ML BY MOUTH TWICE DAILY, Disp: 946 mL, Rfl: 1   prochlorperazine (COMPAZINE) 10 MG tablet, Take 1 tablet (10 mg total) by mouth every 6 (six) hours as needed (Nausea or vomiting)., Disp: 30 tablet, Rfl: 1   rosuvastatin (CRESTOR) 20 MG tablet, Take 1 tablet (20 mg total) by mouth daily., Disp: 90 tablet, Rfl: 2   vitamin B-12 (CYANOCOBALAMIN) 100 MCG tablet, Take 100 mcg by mouth daily., Disp: , Rfl:    vitamin C (ASCORBIC ACID) 500 MG tablet, Take 500 mg by mouth daily., Disp: , Rfl:    VITAMIN D PO, Take 1 capsule by mouth daily., Disp: , Rfl:    Allergies  Allergen Reactions   Lactose Intolerance (Gi) Diarrhea     Review of Systems  Constitutional: Negative.  Negative for chills and fatigue.  HENT:  Negative for congestion.   Respiratory: Negative.    Cardiovascular: Negative.   Gastrointestinal: Negative.  Negative for abdominal pain.  Musculoskeletal:  Positive for gait problem.  Skin:  Negative for rash.    Today's Vitals   08/10/21 0925  BP: 122/70  Pulse: 70  Temp: 98.2 F (36.8 C)  TempSrc: Oral  Weight: 215 lb 3.2 oz (97.6 kg)  Height: '5\' 9"'  (1.753 m)   Body mass index is 31.78 kg/m.  Wt Readings from Last 3 Encounters:  08/10/21  215 lb 3.2 oz (97.6 kg)  06/24/21 217 lb (98.4 kg)  05/25/21 215 lb (97.5 kg)    Objective:  Physical Exam Vitals reviewed.  Constitutional:      General: He is not in acute distress.    Appearance: Normal appearance.  Cardiovascular:     Pulses: Normal pulses.     Heart sounds: Normal heart sounds. No murmur heard. Pulmonary:     Effort: Pulmonary effort is normal. No respiratory distress.     Breath sounds: Normal breath sounds. No wheezing.  Musculoskeletal:        General: Tenderness (left great toe pain) and deformity (bunion present to left great toe) present.  Neurological:     General: No focal deficit present.     Mental Status: He is alert and oriented to person, place, and time.     Cranial Nerves: No cranial nerve deficit.     Motor: No weakness.  Psychiatric:        Mood and Affect: Mood normal.        Behavior: Behavior normal.        Thought Content: Thought content normal.        Judgment: Judgment normal.        Assessment And Plan:     1. Left foot pain Will treat with kenalog injection and refer to podiatry for further evaluation - triamcinolone acetonide (KENALOG-40) injection 40 mg - Uric acid  2. Type 2 diabetes mellitus with stage 3b chronic kidney disease, without long-term current use of insulin (HCC) Chronic, controlled Continue with current medications - Hemoglobin A1c  3. Hypertensive nephropathy Blood pressure is well controlled Continue current medications  4. Hypothyroidism due to medication - TSH + free T4  5. Class 1 obesity due to excess calories with serious comorbidity and body mass index (BMI) of 31.0 to 31.9 in adult Chronic Discussed healthy diet and regular exercise options  Encouraged to exercise at least 150 minutes per week with 2 days of strength training as tolerated  6. Other long term (current) drug therapy - CBC no Diff     Patient was given opportunity to ask questions. Patient verbalized understanding of  the plan and was able to repeat key elements of the plan. All questions were answered to their satisfaction.  Eric Brine, FNP    I, Eric Brine, FNP, have reviewed all documentation for this visit. The documentation on 08/10/21 for the exam, diagnosis, procedures, and orders are all accurate and complete.   IF YOU HAVE BEEN REFERRED TO A SPECIALIST, IT MAY TAKE 1-2 WEEKS TO SCHEDULE/PROCESS THE REFERRAL. IF YOU HAVE NOT HEARD FROM US/SPECIALIST IN TWO WEEKS, PLEASE GIVE [place] A CALL AT [phone].   THE PATIENT IS ENCOURAGED TO PRACTICE SOCIAL DISTANCING DUE TO THE COVID-19 PANDEMIC.

## 2021-08-11 ENCOUNTER — Other Ambulatory Visit: Payer: Self-pay | Admitting: *Deleted

## 2021-08-11 DIAGNOSIS — C9000 Multiple myeloma not having achieved remission: Secondary | ICD-10-CM

## 2021-08-11 LAB — TSH+FREE T4
Free T4: 1.66 ng/dL (ref 0.82–1.77)
TSH: 2.66 u[IU]/mL (ref 0.450–4.500)

## 2021-08-11 LAB — CBC
Hematocrit: 40.2 % (ref 37.5–51.0)
Hemoglobin: 12.5 g/dL — ABNORMAL LOW (ref 13.0–17.7)
MCH: 28.6 pg (ref 26.6–33.0)
MCHC: 31.1 g/dL — ABNORMAL LOW (ref 31.5–35.7)
MCV: 92 fL (ref 79–97)
Platelets: 143 10*3/uL — ABNORMAL LOW (ref 150–450)
RBC: 4.37 x10E6/uL (ref 4.14–5.80)
RDW: 15.9 % — ABNORMAL HIGH (ref 11.6–15.4)
WBC: 9 10*3/uL (ref 3.4–10.8)

## 2021-08-11 LAB — URIC ACID: Uric Acid: 8.8 mg/dL — ABNORMAL HIGH (ref 3.8–8.4)

## 2021-08-11 LAB — HEMOGLOBIN A1C
Est. average glucose Bld gHb Est-mCnc: 154 mg/dL
Hgb A1c MFr Bld: 7 % — ABNORMAL HIGH (ref 4.8–5.6)

## 2021-08-12 ENCOUNTER — Other Ambulatory Visit: Payer: Self-pay

## 2021-08-12 ENCOUNTER — Inpatient Hospital Stay: Payer: Medicare Other

## 2021-08-12 ENCOUNTER — Inpatient Hospital Stay: Payer: Medicare Other | Attending: Family

## 2021-08-12 VITALS — BP 134/89 | HR 70 | Resp 18

## 2021-08-12 DIAGNOSIS — Z79899 Other long term (current) drug therapy: Secondary | ICD-10-CM | POA: Diagnosis not present

## 2021-08-12 DIAGNOSIS — C9000 Multiple myeloma not having achieved remission: Secondary | ICD-10-CM | POA: Diagnosis not present

## 2021-08-12 DIAGNOSIS — M7989 Other specified soft tissue disorders: Secondary | ICD-10-CM | POA: Insufficient documentation

## 2021-08-12 DIAGNOSIS — Z5111 Encounter for antineoplastic chemotherapy: Secondary | ICD-10-CM | POA: Insufficient documentation

## 2021-08-12 DIAGNOSIS — Z5112 Encounter for antineoplastic immunotherapy: Secondary | ICD-10-CM | POA: Insufficient documentation

## 2021-08-12 LAB — CBC WITH DIFFERENTIAL (CANCER CENTER ONLY)
Abs Immature Granulocytes: 0.05 10*3/uL (ref 0.00–0.07)
Basophils Absolute: 0 10*3/uL (ref 0.0–0.1)
Basophils Relative: 0 %
Eosinophils Absolute: 0 10*3/uL (ref 0.0–0.5)
Eosinophils Relative: 0 %
HCT: 37.9 % — ABNORMAL LOW (ref 39.0–52.0)
Hemoglobin: 12.4 g/dL — ABNORMAL LOW (ref 13.0–17.0)
Immature Granulocytes: 1 %
Lymphocytes Relative: 7 %
Lymphs Abs: 0.5 10*3/uL — ABNORMAL LOW (ref 0.7–4.0)
MCH: 29.2 pg (ref 26.0–34.0)
MCHC: 32.7 g/dL (ref 30.0–36.0)
MCV: 89.4 fL (ref 80.0–100.0)
Monocytes Absolute: 1.2 10*3/uL — ABNORMAL HIGH (ref 0.1–1.0)
Monocytes Relative: 14 %
Neutro Abs: 6.5 10*3/uL (ref 1.7–7.7)
Neutrophils Relative %: 78 %
Platelet Count: 123 10*3/uL — ABNORMAL LOW (ref 150–400)
RBC: 4.24 MIL/uL (ref 4.22–5.81)
RDW: 17.8 % — ABNORMAL HIGH (ref 11.5–15.5)
WBC Count: 8.3 10*3/uL (ref 4.0–10.5)
nRBC: 0 % (ref 0.0–0.2)

## 2021-08-12 LAB — CMP (CANCER CENTER ONLY)
ALT: 25 U/L (ref 0–44)
AST: 20 U/L (ref 15–41)
Albumin: 3.5 g/dL (ref 3.5–5.0)
Alkaline Phosphatase: 69 U/L (ref 38–126)
Anion gap: 9 (ref 5–15)
BUN: 39 mg/dL — ABNORMAL HIGH (ref 8–23)
CO2: 25 mmol/L (ref 22–32)
Calcium: 9 mg/dL (ref 8.9–10.3)
Chloride: 107 mmol/L (ref 98–111)
Creatinine: 1.85 mg/dL — ABNORMAL HIGH (ref 0.61–1.24)
GFR, Estimated: 39 mL/min — ABNORMAL LOW (ref >60)
Glucose, Bld: 125 mg/dL — ABNORMAL HIGH (ref 70–99)
Potassium: 3.7 mmol/L (ref 3.5–5.1)
Sodium: 141 mmol/L (ref 135–145)
Total Bilirubin: 0.8 mg/dL (ref 0.3–1.2)
Total Protein: 5.4 g/dL — ABNORMAL LOW (ref 6.5–8.1)

## 2021-08-12 MED ORDER — SODIUM CHLORIDE 0.9 % IV SOLN
20.0000 mg | Freq: Once | INTRAVENOUS | Status: AC
  Administered 2021-08-12: 20 mg via INTRAVENOUS
  Filled 2021-08-12: qty 20

## 2021-08-12 MED ORDER — BORTEZOMIB CHEMO SQ INJECTION 3.5 MG (2.5MG/ML)
1.3000 mg/m2 | Freq: Once | INTRAMUSCULAR | Status: AC
  Administered 2021-08-12: 3 mg via SUBCUTANEOUS
  Filled 2021-08-12: qty 1.2

## 2021-08-12 MED ORDER — SODIUM CHLORIDE 0.9 % IV SOLN
400.0000 mg/m2 | Freq: Once | INTRAVENOUS | Status: AC
  Administered 2021-08-12: 900 mg via INTRAVENOUS
  Filled 2021-08-12: qty 45

## 2021-08-12 MED ORDER — SODIUM CHLORIDE 0.9 % IV SOLN: Freq: Once | INTRAVENOUS | Status: AC

## 2021-08-12 MED ORDER — PALONOSETRON HCL INJECTION 0.25 MG/5ML
0.2500 mg | Freq: Once | INTRAVENOUS | Status: AC
  Administered 2021-08-12: 0.25 mg via INTRAVENOUS
  Filled 2021-08-12: qty 5

## 2021-08-12 NOTE — Patient Instructions (Addendum)
[facility]  Discharge Instructions: Thank you for choosing [facility] to provide your oncology and hematology care.   If you have a lab appointment with the [facility], please go directly to the [facility] and check in at the registration area.  Wear comfortable clothing and clothing appropriate for easy access to any Portacath or PICC line.   We strive to give you quality time with your provider. You may need to reschedule your appointment if you arrive late (15 or more minutes).  Arriving late affects you and other patients whose appointments are after yours.  Also, if you miss three or more appointments without notifying the office, you may be dismissed from the clinic at the provider's discretion.      For prescription refill requests, have your pharmacy contact our office and allow 72 hours for refills to be completed.   Today you received the following chemotherapy and/or immunotherapy agents velcade,  cytoxan    To help prevent nausea and vomiting after your treatment, we encourage you to take your nausea medication as directed.  BELOW ARE SYMPTOMS THAT SHOULD BE REPORTED IMMEDIATELY: *FEVER GREATER THAN 100.4 F (38 C) OR HIGHER *CHILLS OR SWEATING *NAUSEA AND VOMITING THAT IS NOT CONTROLLED WITH YOUR NAUSEA MEDICATION *UNUSUAL SHORTNESS OF BREATH *UNUSUAL BRUISING OR BLEEDING *URINARY PROBLEMS (pain or burning when urinating, or frequent urination) *BOWEL PROBLEMS (unusual diarrhea, constipation, pain near the anus) TENDERNESS IN MOUTH AND THROAT WITH OR WITHOUT PRESENCE OF ULCERS (sore throat, sores in mouth, or a toothache) UNUSUAL RASH, SWELLING OR PAIN  UNUSUAL VAGINAL DISCHARGE OR ITCHING   Items with * indicate a potential emergency and should be followed up as soon as possible or go to the Emergency Department if any problems should occur.  Please show the CHEMOTHERAPY ALERT CARD or IMMUNOTHERAPY ALERT CARD at check-in to  the Emergency Department and triage nurse. Should you have questions after your visit or need to cancel or reschedule your appointment, please contact [facility]  [phone] and follow the prompts.  Office hours are 8:00 a.m. to 4:30 p.m. Monday - Friday. Please note that voicemails left after 4:00 p.m. may not be returned until the following business day.  We are closed weekends and major holidays. You have access to a nurse at all times for urgent questions. Please call the main number to the clinic [phone] and follow the prompts.  For any non-urgent questions, you may also contact your provider using MyChart. We now offer e-Visits for anyone 70 and older to request care online for non-urgent symptoms. For details visit [email or web address].   Also download the MyChart app! Go to the app store, search "MyChart", open the app, select [place], and log in with your MyChart username and password.  Due to Covid, a mask is required upon entering the hospital/clinic. If you do not have a mask, one will be given to you upon arrival. For doctor visits, patients may have 1 support person aged 12 or older with them. For treatment visits, patients cannot have anyone with them due to current Covid guidelines and our immunocompromised population.

## 2021-08-12 NOTE — Progress Notes (Signed)
[place] to treat today with SCr 1.85 per Dr. Marin Olp.

## 2021-08-21 ENCOUNTER — Encounter: Payer: Self-pay | Admitting: Nurse Practitioner

## 2021-08-29 NOTE — Progress Notes (Signed)
Ask him if he is willing to take 0.25mg  of Ozempic take for 6 weeks then f/u with Dr. Baird Cancer, he will likely stay on that dose. Give him a sample if he is willing

## 2021-09-02 ENCOUNTER — Ambulatory Visit: Payer: Medicare Other | Admitting: Hematology & Oncology

## 2021-09-02 ENCOUNTER — Other Ambulatory Visit: Payer: Medicare Other

## 2021-09-02 ENCOUNTER — Ambulatory Visit: Payer: Medicare Other

## 2021-09-11 ENCOUNTER — Other Ambulatory Visit: Payer: Self-pay | Admitting: Internal Medicine

## 2021-09-11 MED ORDER — AMOXICILLIN-POT CLAVULANATE 875-125 MG PO TABS
1.0000 | ORAL_TABLET | Freq: Two times a day (BID) | ORAL | 0 refills | Status: AC
Start: 2021-09-11 — End: 2021-09-21

## 2021-09-11 MED ORDER — BENZONATATE 100 MG PO CAPS: 100.0000 mg | ORAL_CAPSULE | Freq: Three times a day (TID) | ORAL | 1 refills | Status: DC | PRN

## 2021-09-12 ENCOUNTER — Other Ambulatory Visit: Payer: Self-pay | Admitting: Internal Medicine

## 2021-09-13 ENCOUNTER — Ambulatory Visit (INDEPENDENT_AMBULATORY_CARE_PROVIDER_SITE_OTHER): Payer: Medicare Other

## 2021-09-13 DIAGNOSIS — I428 Other cardiomyopathies: Secondary | ICD-10-CM

## 2021-09-13 LAB — CUP PACEART REMOTE DEVICE CHECK
Battery Remaining Longevity: 25 mo
Battery Remaining Percentage: 31 %
Battery Voltage: 2.89 V
Date Time Interrogation Session: 20221220020025
HighPow Impedance: 57 Ohm
HighPow Impedance: 57 Ohm
Implantable Lead Implant Date: 20170525
Implantable Lead Implant Date: 20170525
Implantable Lead Implant Date: 20170525
Implantable Lead Location: 753858
Implantable Lead Location: 753859
Implantable Lead Location: 753860
Implantable Lead Model: 7122
Implantable Pulse Generator Implant Date: 20170525
Lead Channel Impedance Value: 380 Ohm
Lead Channel Impedance Value: 430 Ohm
Lead Channel Impedance Value: 580 Ohm
Lead Channel Pacing Threshold Amplitude: 0.5 V
Lead Channel Pacing Threshold Amplitude: 1.25 V
Lead Channel Pacing Threshold Pulse Width: 0.5 ms
Lead Channel Pacing Threshold Pulse Width: 0.5 ms
Lead Channel Sensing Intrinsic Amplitude: 0.2 mV
Lead Channel Sensing Intrinsic Amplitude: 11.4 mV
Lead Channel Setting Pacing Amplitude: 2 V
Lead Channel Setting Pacing Amplitude: 2.5 V
Lead Channel Setting Pacing Pulse Width: 0.5 ms
Lead Channel Setting Pacing Pulse Width: 0.5 ms
Lead Channel Setting Sensing Sensitivity: 0.5 mV
Pulse Gen Serial Number: 7357926

## 2021-09-16 ENCOUNTER — Telehealth: Payer: Self-pay | Admitting: Hematology & Oncology

## 2021-09-16 NOTE — Telephone Encounter (Signed)
Rescheduled appt per 09/16/21 sch msg - unable to reach patient. Left message for patient. Reschedule due to provider PAL 12/30 afternoon

## 2021-09-22 NOTE — Progress Notes (Signed)
Remote ICD transmission.   

## 2021-09-23 ENCOUNTER — Inpatient Hospital Stay: Payer: Medicare Other | Attending: Family

## 2021-09-23 ENCOUNTER — Inpatient Hospital Stay: Payer: Medicare Other

## 2021-09-23 ENCOUNTER — Encounter: Payer: Self-pay | Admitting: Hematology & Oncology

## 2021-09-23 ENCOUNTER — Other Ambulatory Visit: Payer: Self-pay | Admitting: Interventional Cardiology

## 2021-09-23 ENCOUNTER — Ambulatory Visit: Payer: Medicare Other

## 2021-09-23 ENCOUNTER — Inpatient Hospital Stay (HOSPITAL_BASED_OUTPATIENT_CLINIC_OR_DEPARTMENT_OTHER): Payer: Medicare Other | Admitting: Hematology & Oncology

## 2021-09-23 ENCOUNTER — Other Ambulatory Visit: Payer: Medicare Other

## 2021-09-23 ENCOUNTER — Other Ambulatory Visit: Payer: Self-pay

## 2021-09-23 ENCOUNTER — Other Ambulatory Visit: Payer: Self-pay | Admitting: Internal Medicine

## 2021-09-23 ENCOUNTER — Ambulatory Visit: Payer: Medicare Other | Admitting: Hematology & Oncology

## 2021-09-23 ENCOUNTER — Inpatient Hospital Stay: Payer: Medicare Other | Admitting: Hematology & Oncology

## 2021-09-23 VITALS — BP 126/88 | HR 61 | Temp 98.2°F | Resp 18 | Wt 205.0 lb

## 2021-09-23 DIAGNOSIS — Z5111 Encounter for antineoplastic chemotherapy: Secondary | ICD-10-CM | POA: Diagnosis not present

## 2021-09-23 DIAGNOSIS — M7989 Other specified soft tissue disorders: Secondary | ICD-10-CM | POA: Insufficient documentation

## 2021-09-23 DIAGNOSIS — C9000 Multiple myeloma not having achieved remission: Secondary | ICD-10-CM

## 2021-09-23 DIAGNOSIS — Z79899 Other long term (current) drug therapy: Secondary | ICD-10-CM | POA: Diagnosis not present

## 2021-09-23 DIAGNOSIS — Z992 Dependence on renal dialysis: Secondary | ICD-10-CM | POA: Insufficient documentation

## 2021-09-23 DIAGNOSIS — Z5112 Encounter for antineoplastic immunotherapy: Secondary | ICD-10-CM | POA: Diagnosis not present

## 2021-09-23 LAB — CBC WITH DIFFERENTIAL (CANCER CENTER ONLY)
Abs Immature Granulocytes: 0.04 10*3/uL (ref 0.00–0.07)
Basophils Absolute: 0 10*3/uL (ref 0.0–0.1)
Basophils Relative: 1 %
Eosinophils Absolute: 0.2 10*3/uL (ref 0.0–0.5)
Eosinophils Relative: 2 %
HCT: 36.2 % — ABNORMAL LOW (ref 39.0–52.0)
Hemoglobin: 11.7 g/dL — ABNORMAL LOW (ref 13.0–17.0)
Immature Granulocytes: 1 %
Lymphocytes Relative: 10 %
Lymphs Abs: 0.7 10*3/uL (ref 0.7–4.0)
MCH: 28.9 pg (ref 26.0–34.0)
MCHC: 32.3 g/dL (ref 30.0–36.0)
MCV: 89.4 fL (ref 80.0–100.0)
Monocytes Absolute: 0.8 10*3/uL (ref 0.1–1.0)
Monocytes Relative: 12 %
Neutro Abs: 4.9 10*3/uL (ref 1.7–7.7)
Neutrophils Relative %: 74 %
Platelet Count: 277 10*3/uL (ref 150–400)
RBC: 4.05 MIL/uL — ABNORMAL LOW (ref 4.22–5.81)
RDW: 17.6 % — ABNORMAL HIGH (ref 11.5–15.5)
WBC Count: 6.6 10*3/uL (ref 4.0–10.5)
nRBC: 0 % (ref 0.0–0.2)

## 2021-09-23 LAB — CMP (CANCER CENTER ONLY)
ALT: 25 U/L (ref 0–44)
AST: 29 U/L (ref 15–41)
Albumin: 3.4 g/dL — ABNORMAL LOW (ref 3.5–5.0)
Alkaline Phosphatase: 67 U/L (ref 38–126)
Anion gap: 9 (ref 5–15)
BUN: 24 mg/dL — ABNORMAL HIGH (ref 8–23)
CO2: 27 mmol/L (ref 22–32)
Calcium: 8.9 mg/dL (ref 8.9–10.3)
Chloride: 106 mmol/L (ref 98–111)
Creatinine: 1.87 mg/dL — ABNORMAL HIGH (ref 0.61–1.24)
GFR, Estimated: 38 mL/min — ABNORMAL LOW (ref >60)
Glucose, Bld: 157 mg/dL — ABNORMAL HIGH (ref 70–99)
Potassium: 3.4 mmol/L — ABNORMAL LOW (ref 3.5–5.1)
Sodium: 142 mmol/L (ref 135–145)
Total Bilirubin: 0.7 mg/dL (ref 0.3–1.2)
Total Protein: 5.6 g/dL — ABNORMAL LOW (ref 6.5–8.1)

## 2021-09-23 LAB — LACTATE DEHYDROGENASE: LDH: 296 U/L — ABNORMAL HIGH (ref 98–192)

## 2021-09-23 MED ORDER — PALONOSETRON HCL INJECTION 0.25 MG/5ML
0.2500 mg | Freq: Once | INTRAVENOUS | Status: AC
  Administered 2021-09-23: 13:00:00 0.25 mg via INTRAVENOUS
  Filled 2021-09-23: qty 5

## 2021-09-23 MED ORDER — SODIUM CHLORIDE 0.9 % IV SOLN: Freq: Once | INTRAVENOUS | Status: AC

## 2021-09-23 MED ORDER — SODIUM CHLORIDE 0.9 % IV SOLN
20.0000 mg | Freq: Once | INTRAVENOUS | Status: AC
  Administered 2021-09-23: 13:00:00 20 mg via INTRAVENOUS
  Filled 2021-09-23: qty 20

## 2021-09-23 MED ORDER — SODIUM CHLORIDE 0.9 % IV SOLN
400.0000 mg/m2 | Freq: Once | INTRAVENOUS | Status: AC
  Administered 2021-09-23: 14:00:00 900 mg via INTRAVENOUS
  Filled 2021-09-23: qty 45

## 2021-09-23 MED ORDER — BORTEZOMIB CHEMO SQ INJECTION 3.5 MG (2.5MG/ML)
1.3000 mg/m2 | Freq: Once | INTRAMUSCULAR | Status: AC
  Administered 2021-09-23: 15:00:00 3 mg via SUBCUTANEOUS
  Filled 2021-09-23: qty 1.2

## 2021-09-23 NOTE — Patient Instructions (Signed)
[facility]  Discharge Instructions: Thank you for choosing [facility] to provide your oncology and hematology care.   If you have a lab appointment with the [facility], please go directly to the [facility] and check in at the registration area. [facility]  Discharge Instructions: Thank you for choosing [facility] to provide your oncology and hematology care.   If you have a lab appointment with the [facility], please go directly to the [facility] and check in at the registration area.  Wear comfortable clothing and clothing appropriate for easy access to any Portacath or PICC line.   We strive to give you quality time with your provider. You may need to reschedule your appointment if you arrive late (15 or more minutes).  Arriving late affects you and other patients whose appointments are after yours.  Also, if you miss three or more appointments without notifying the office, you may be dismissed from the clinic at the providers discretion.      For prescription refill requests, have your pharmacy contact our office and allow 72 hours for refills to be completed.    Today you received the following chemotherapy and/or immunotherapy agents cytoxan, velcade       To help prevent nausea and vomiting after your treatment, we encourage you to take your nausea medication as directed.  BELOW ARE SYMPTOMS THAT SHOULD BE REPORTED IMMEDIATELY: *FEVER GREATER THAN 100.4 F (38 C) OR HIGHER *CHILLS OR SWEATING *NAUSEA AND VOMITING THAT IS NOT CONTROLLED WITH YOUR NAUSEA MEDICATION *UNUSUAL SHORTNESS OF BREATH *UNUSUAL BRUISING OR BLEEDING *URINARY PROBLEMS (pain or burning when urinating, or frequent urination) *BOWEL PROBLEMS (unusual diarrhea, constipation, pain near the anus) TENDERNESS IN MOUTH AND THROAT WITH OR WITHOUT PRESENCE OF ULCERS (sore throat, sores in mouth, or a toothache) UNUSUAL  RASH, SWELLING OR PAIN  UNUSUAL VAGINAL DISCHARGE OR ITCHING   Items with * indicate a potential emergency and should be followed up as soon as possible or go to the Emergency Department if any problems should occur.  Please show the CHEMOTHERAPY ALERT CARD or IMMUNOTHERAPY ALERT CARD at check-in to the Emergency Department and triage nurse. Should you have questions after your visit or need to cancel or reschedule your appointment, please contact [place]  [phone] and follow the prompts.  Office hours are 8:00 a.m. to 4:30 p.m. Monday - Friday. Please note that voicemails left after 4:00 p.m. may not be returned until the following business day.  We are closed weekends and major holidays. You have access to a nurse at all times for urgent questions. Please call the main number to the clinic [phone] and follow the prompts.  For any non-urgent questions, you may also contact your provider using MyChart. We now offer e-Visits for anyone 4 and older to request care online for non-urgent symptoms. For details visit [email or web address].   Also download the MyChart app! Go to the app store, search "MyChart", open the app, select [place], and log in with your MyChart username and password.  Due to Covid, a mask is required upon entering the hospital/clinic. If you do not have a mask, one will be given to you upon arrival. For doctor visits, patients may have 1 support person aged 67 or older with them. For treatment visits, patients cannot have anyone with them due to current Covid guidelines and our immunocompromised population.  Wear  comfortable clothing and clothing appropriate for easy access to any Portacath or PICC line.   We strive to give you quality time with your provider. You may need to reschedule your appointment if you arrive late (15 or more minutes).  Arriving late affects you and other patients whose appointments are after yours.  Also, if you  miss three or more appointments without notifying the office, you may be dismissed from the clinic at the providers discretion.      For prescription refill requests, have your pharmacy contact our office and allow 72 hours for refills to be completed.    Today you received the following chemotherapy and/or immunotherapy agents Cytoxan, Velcade.   To help prevent nausea and vomiting after your treatment, we encourage you to take your nausea medication as directed.  BELOW ARE SYMPTOMS THAT SHOULD BE REPORTED IMMEDIATELY: *FEVER GREATER THAN 100.4 F (38 C) OR HIGHER *CHILLS OR SWEATING *NAUSEA AND VOMITING THAT IS NOT CONTROLLED WITH YOUR NAUSEA MEDICATION *UNUSUAL SHORTNESS OF BREATH *UNUSUAL BRUISING OR BLEEDING *URINARY PROBLEMS (pain or burning when urinating, or frequent urination) *BOWEL PROBLEMS (unusual diarrhea, constipation, pain near the anus) TENDERNESS IN MOUTH AND THROAT WITH OR WITHOUT PRESENCE OF ULCERS (sore throat, sores in mouth, or a toothache) UNUSUAL RASH, SWELLING OR PAIN  UNUSUAL VAGINAL DISCHARGE OR ITCHING   Items with * indicate a potential emergency and should be followed up as soon as possible or go to the Emergency Department if any problems should occur.  Please show the CHEMOTHERAPY ALERT CARD or IMMUNOTHERAPY ALERT CARD at check-in to the Emergency Department and triage nurse. Should you have questions after your visit or need to cancel or reschedule your appointment, please contact [facility]  [phone] and follow the prompts.  Office hours are 8:00 a.m. to 4:30 p.m. Monday - Friday. Please note that voicemails left after 4:00 p.m. may not be returned until the following business day.  We are closed weekends and major holidays. You have access to a nurse at all times for urgent questions. Please call the main number to the clinic [phone] and follow the prompts.  For any non-urgent questions, you may also contact your  provider using MyChart. We now offer e-Visits for anyone 2 and older to request care online for non-urgent symptoms. For details visit [email or web address].   Also download the MyChart app! Go to the app store, search "MyChart", open the app, select [facility], and log in with your MyChart username and password.  Due to Covid, a mask is required upon entering the hospital/clinic. If you do not have a mask, one will be given to you upon arrival. For doctor visits, patients may have 1 support person aged 38 or older with them. For treatment visits, patients cannot have anyone with them due to current Covid guidelines and our immunocompromised population.

## 2021-09-23 NOTE — Progress Notes (Addendum)
Hematology and Oncology Follow Up Visit  Norvin Ohlin., MD [id number] 02-20-1951 70 y.o. 09/23/2021   Principle Diagnosis:  IgG kappa myeloma  - 1q+   Current Therapy:        CyBorD -- s/p cycle 10 - started on 06/04/2020   Interim History:  Mr. Bumgarner is here today for follow-up and treatment.  Is been quite a while since we last saw him.  He has been having some issues with respect to his poor wife.  She is on dialysis right now.  He is still working part-time as an Chief Strategy Officer.  He is cutting back a little bit because of his wife's medical needs.  He is doing okay.  Had a nice Thanksgiving and Christmas.  He has had no problems with nausea or vomiting.  He has had no issues with dehydration.  He has had no change in bowel or bladder habits.  There is been no bleeding.  His last monoclonal spike was not found in his blood.  His IgG level was 399 mg/dL.  The Kappa light chain was 1.8 mg/dL.  He has had no fever.  There is been no leg swelling.  Overall, I would say that his performance status is probably ECOG 1.     Medications:  Allergies as of 09/23/2021       Reactions   Lactose Intolerance (gi) Diarrhea        Medication List        Accurate as of September 23, 2021  8:25 AM. If you have any questions, ask your nurse or doctor.          acetaminophen 500 MG tablet Commonly known as: TYLENOL Take 1,000 mg by mouth every 4 (four) hours as needed for moderate pain or headache.   allopurinol 100 MG tablet Commonly known as: ZYLOPRIM Take 1 tab by mouth every other day   amiodarone 200 MG tablet Commonly known as: PACERONE TK 1 T PO D   amiodarone 100 MG tablet Commonly known as: PACERONE TAKE 1 TABLET(100 MG) BY MOUTH DAILY. PLEASE MAKE OVERDUE APPOINTMENT WITH DOCTOR TAYLOR BEFORE ANYMORE REFILLS. THANK YOU FIRST ATTEMPT   apixaban 5 MG Tabs tablet Commonly known as: Eliquis Take 1 tablet (5 mg total) by mouth 2 (two) times daily.    ARTIFICIAL TEARS OP Place 1 drop into both eyes daily as needed (dry eyes).   benzonatate 100 MG capsule Commonly known as: Tessalon Perles Take 1 capsule (100 mg total) by mouth 3 (three) times daily as needed for cough.   carvedilol 6.25 MG tablet Commonly known as: COREG TAKE 1 TABLET(6.25 MG) BY MOUTH TWICE DAILY   docusate sodium 100 MG capsule Commonly known as: COLACE Take 100 mg by mouth daily as needed for mild constipation.   famciclovir 500 MG tablet Commonly known as: FAMVIR Take 1 tablet (500 mg total) by mouth daily.   fluticasone 50 MCG/ACT nasal spray Commonly known as: FLONASE Place 2 sprays into both nostrils daily.   furosemide 80 MG tablet Commonly known as: LASIX Take 1 tablet (80 mg total) by mouth daily.   ipratropium 0.06 % nasal spray Commonly known as: ATROVENT Place 2 sprays into both nostrils 2 (two) times daily.   isosorbide-hydrALAZINE 20-37.5 MG tablet Commonly known as: BIDIL TAKE 1 TABLET BY MOUTH THREE TIMES DAILY What changed:  how much to take how to take this when to take this additional instructions   levothyroxine 75 MCG tablet Commonly known as: SYNTHROID TAKE 1  TABLET BY MOUTH DAILY IN THE MORNING. EXCEPT TAKE 1/2 TABLET ON SUNDAYS IN THE MORNING   loperamide 2 MG capsule Commonly known as: IMODIUM Take 2 mg by mouth as needed for diarrhea or loose stools.   ondansetron 8 MG tablet Commonly known as: Zofran Take 1 tablet (8 mg total) by mouth 2 (two) times daily as needed for refractory nausea / vomiting. Start on day 3 after Cytoxan.   potassium chloride 20 MEQ/15ML (10%) Soln TAKE 15 ML BY MOUTH TWICE DAILY   prochlorperazine 10 MG tablet Commonly known as: COMPAZINE Take 1 tablet (10 mg total) by mouth every 6 (six) hours as needed (Nausea or vomiting).   rosuvastatin 20 MG tablet Commonly known as: CRESTOR Take 1 tablet (20 mg total) by mouth daily.   vitamin B-12 100 MCG tablet Commonly known as:  CYANOCOBALAMIN Take 100 mcg by mouth daily.   vitamin C 500 MG tablet Commonly known as: ASCORBIC ACID Take 500 mg by mouth daily.   VITAMIN D PO Take 1 capsule by mouth daily.        Allergies:  Allergies  Allergen Reactions   Lactose Intolerance (Gi) Diarrhea    Past Medical History, Surgical history, Social history, and Family History were reviewed and updated.  Review of Systems: Review of Systems  Constitutional: Negative.   Eyes: Negative.   Respiratory: Negative.    Cardiovascular:  Positive for leg swelling.  Gastrointestinal: Negative.   Genitourinary: Negative.   Musculoskeletal: Negative.   Skin: Negative.   Neurological: Negative.   Endo/Heme/Allergies: Negative.   Psychiatric/Behavioral: Negative.      Physical Exam:  vitals were not taken for this visit.   Wt Readings from Last 3 Encounters:  08/10/21 215 lb 3.2 oz (97.6 kg)  06/24/21 217 lb (98.4 kg)  05/25/21 215 lb (97.5 kg)   His vital signs show temperature of 98.2.  Pulse 61.  Blood pressure 126/88.  Weight is 205 pounds.  Physical Exam Vitals reviewed.  HENT:     Head: Normocephalic and atraumatic.  Eyes:     Pupils: Pupils are equal, round, and reactive to light.  Cardiovascular:     Rate and Rhythm: Normal rate and regular rhythm.     Heart sounds: Normal heart sounds.  Pulmonary:     Effort: Pulmonary effort is normal.     Breath sounds: Normal breath sounds.  Abdominal:     General: Bowel sounds are normal.     Palpations: Abdomen is soft.  Musculoskeletal:        General: No tenderness or deformity. Normal range of motion.     Cervical back: Normal range of motion.  Lymphadenopathy:     Cervical: No cervical adenopathy.  Skin:    General: Skin is warm and dry.     Findings: No erythema or rash.  Neurological:     Mental Status: He is alert and oriented to person, place, and time.  Psychiatric:        Behavior: Behavior normal.        Thought Content: Thought content  normal.        Judgment: Judgment normal.      Lab Results  Component Value Date   WBC 6.6 09/23/2021   HGB 11.7 (L) 09/23/2021   HCT 36.2 (L) 09/23/2021   MCV 89.4 09/23/2021   PLT 277 09/23/2021   Lab Results  Component Value Date   FERRITIN 77 08/11/2020   IRON 39 (L) 08/11/2020   TIBC 400 08/11/2020  UIBC 361 08/11/2020   IRONPCTSAT 10 (L) 08/11/2020   Lab Results  Component Value Date   RETICCTPCT 0.4 08/11/2020   RBC 4.05 (L) 09/23/2021   Lab Results  Component Value Date   KPAFRELGTCHN 18.4 06/24/2021   LAMBDASER 16.4 06/24/2021   KAPLAMBRATIO 1.12 06/24/2021   Lab Results  Component Value Date   IGGSERUM 399 (L) 06/24/2021   IGA 69 06/24/2021   IGMSERUM 10 (L) 06/24/2021   Lab Results  Component Value Date   TOTALPROTELP 4.8 (L) 06/24/2021   ALBUMINELP 2.9 06/24/2021   A1GS 0.2 06/24/2021   A2GS 0.5 06/24/2021   BETS 0.8 06/24/2021   GAMS 0.3 (L) 06/24/2021   MSPIKE Not Observed 06/24/2021   SPEI Comment 04/22/2021     Chemistry      Component Value Date/Time   NA 141 08/12/2021 1333   NA 146 (H) 03/31/2021 1707   K 3.7 08/12/2021 1333   CL 107 08/12/2021 1333   CO2 25 08/12/2021 1333   BUN 39 (H) 08/12/2021 1333   BUN 31 (H) 03/31/2021 1707   CREATININE 1.85 (H) 08/12/2021 1333   CREATININE 1.95 (H) 09/08/2016 0859      Component Value Date/Time   CALCIUM 9.0 08/12/2021 1333   ALKPHOS 69 08/12/2021 1333   AST 20 08/12/2021 1333   ALT 25 08/12/2021 1333   BILITOT 0.8 08/12/2021 1333       Impression and Plan: Mr. Rudd is a very nice 70 yo African American gentleman with IgG kappa myeloma.    He does not wish to have a stem cell transplant.  I realize this is a "gray area" given his age and overall performance status.  He really wants to make sure that his wife is taken care of.  We will go ahead with treatment today.  Hopefully, his myeloma studies have not worsened.  I would have to believe that everything should be relatively  stable.  He really does look quite good.  His renal function is adequate.  We will continue to treat him every 3 weeks.  I will plan to see him back in 6 weeks.   Volanda Napoleon, MD 12/30/20228:25 AM

## 2021-09-24 LAB — IGG, IGA, IGM
IgA: 86 mg/dL (ref 61–437)
IgG (Immunoglobin G), Serum: 415 mg/dL — ABNORMAL LOW (ref 603–1613)
IgM (Immunoglobulin M), Srm: 13 mg/dL — ABNORMAL LOW (ref 20–172)

## 2021-09-27 LAB — PROTEIN ELECTROPHORESIS, SERUM, WITH REFLEX
A/G Ratio: 1.5 (ref 0.7–1.7)
Albumin ELP: 3.1 g/dL (ref 2.9–4.4)
Alpha-1-Globulin: 0.3 g/dL (ref 0.0–0.4)
Alpha-2-Globulin: 0.7 g/dL (ref 0.4–1.0)
Beta Globulin: 0.8 g/dL (ref 0.7–1.3)
Gamma Globulin: 0.3 g/dL — ABNORMAL LOW (ref 0.4–1.8)
Globulin, Total: 2.1 g/dL — ABNORMAL LOW (ref 2.2–3.9)
Total Protein ELP: 5.2 g/dL — ABNORMAL LOW (ref 6.0–8.5)

## 2021-09-27 LAB — KAPPA/LAMBDA LIGHT CHAINS
Kappa free light chain: 19.1 mg/L (ref 3.3–19.4)
Kappa, lambda light chain ratio: 0.92 (ref 0.26–1.65)
Lambda free light chains: 20.7 mg/L (ref 5.7–26.3)

## 2021-10-07 ENCOUNTER — Ambulatory Visit: Payer: Medicare Other | Admitting: Podiatry

## 2021-10-13 ENCOUNTER — Other Ambulatory Visit: Payer: Self-pay | Admitting: *Deleted

## 2021-10-13 DIAGNOSIS — C9000 Multiple myeloma not having achieved remission: Secondary | ICD-10-CM

## 2021-10-13 DIAGNOSIS — C9001 Multiple myeloma in remission: Secondary | ICD-10-CM

## 2021-10-14 ENCOUNTER — Other Ambulatory Visit: Payer: Self-pay

## 2021-10-14 ENCOUNTER — Inpatient Hospital Stay: Payer: Medicare Other

## 2021-10-14 ENCOUNTER — Inpatient Hospital Stay: Payer: Medicare Other | Attending: Family

## 2021-10-14 VITALS — BP 130/90 | HR 70 | Temp 99.3°F | Resp 18 | Ht 69.0 in

## 2021-10-14 DIAGNOSIS — Z5111 Encounter for antineoplastic chemotherapy: Secondary | ICD-10-CM | POA: Diagnosis not present

## 2021-10-14 DIAGNOSIS — R609 Edema, unspecified: Secondary | ICD-10-CM | POA: Diagnosis not present

## 2021-10-14 DIAGNOSIS — C9000 Multiple myeloma not having achieved remission: Secondary | ICD-10-CM

## 2021-10-14 DIAGNOSIS — Z79899 Other long term (current) drug therapy: Secondary | ICD-10-CM | POA: Insufficient documentation

## 2021-10-14 DIAGNOSIS — C9001 Multiple myeloma in remission: Secondary | ICD-10-CM

## 2021-10-14 DIAGNOSIS — Z5112 Encounter for antineoplastic immunotherapy: Secondary | ICD-10-CM | POA: Diagnosis not present

## 2021-10-14 LAB — CBC WITH DIFFERENTIAL (CANCER CENTER ONLY)
Abs Immature Granulocytes: 0.01 10*3/uL (ref 0.00–0.07)
Basophils Absolute: 0 10*3/uL (ref 0.0–0.1)
Basophils Relative: 1 %
Eosinophils Absolute: 0.1 10*3/uL (ref 0.0–0.5)
Eosinophils Relative: 2 %
HCT: 36.1 % — ABNORMAL LOW (ref 39.0–52.0)
Hemoglobin: 11.7 g/dL — ABNORMAL LOW (ref 13.0–17.0)
Immature Granulocytes: 0 %
Lymphocytes Relative: 12 %
Lymphs Abs: 0.5 10*3/uL — ABNORMAL LOW (ref 0.7–4.0)
MCH: 29.5 pg (ref 26.0–34.0)
MCHC: 32.4 g/dL (ref 30.0–36.0)
MCV: 90.9 fL (ref 80.0–100.0)
Monocytes Absolute: 0.9 10*3/uL (ref 0.1–1.0)
Monocytes Relative: 21 %
Neutro Abs: 2.8 10*3/uL (ref 1.7–7.7)
Neutrophils Relative %: 64 %
Platelet Count: 144 10*3/uL — ABNORMAL LOW (ref 150–400)
RBC: 3.97 MIL/uL — ABNORMAL LOW (ref 4.22–5.81)
RDW: 18.7 % — ABNORMAL HIGH (ref 11.5–15.5)
WBC Count: 4.4 10*3/uL (ref 4.0–10.5)
nRBC: 0 % (ref 0.0–0.2)

## 2021-10-14 LAB — CMP (CANCER CENTER ONLY)
ALT: 22 U/L (ref 0–44)
AST: 38 U/L (ref 15–41)
Albumin: 3.5 g/dL (ref 3.5–5.0)
Alkaline Phosphatase: 68 U/L (ref 38–126)
Anion gap: 8 (ref 5–15)
BUN: 26 mg/dL — ABNORMAL HIGH (ref 8–23)
CO2: 25 mmol/L (ref 22–32)
Calcium: 8.9 mg/dL (ref 8.9–10.3)
Chloride: 109 mmol/L (ref 98–111)
Creatinine: 1.75 mg/dL — ABNORMAL HIGH (ref 0.61–1.24)
GFR, Estimated: 42 mL/min — ABNORMAL LOW (ref >60)
Glucose, Bld: 117 mg/dL — ABNORMAL HIGH (ref 70–99)
Potassium: 3.7 mmol/L (ref 3.5–5.1)
Sodium: 142 mmol/L (ref 135–145)
Total Bilirubin: 1 mg/dL (ref 0.3–1.2)
Total Protein: 5.3 g/dL — ABNORMAL LOW (ref 6.5–8.1)

## 2021-10-14 MED ORDER — PALONOSETRON HCL INJECTION 0.25 MG/5ML
0.2500 mg | Freq: Once | INTRAVENOUS | Status: AC
  Administered 2021-10-14: 0.25 mg via INTRAVENOUS
  Filled 2021-10-14: qty 5

## 2021-10-14 MED ORDER — SODIUM CHLORIDE 0.9 % IV SOLN
400.0000 mg/m2 | Freq: Once | INTRAVENOUS | Status: AC
  Administered 2021-10-14: 900 mg via INTRAVENOUS
  Filled 2021-10-14: qty 45

## 2021-10-14 MED ORDER — SODIUM CHLORIDE 0.9 % IV SOLN
20.0000 mg | Freq: Once | INTRAVENOUS | Status: AC
  Administered 2021-10-14: 20 mg via INTRAVENOUS
  Filled 2021-10-14: qty 20

## 2021-10-14 MED ORDER — BORTEZOMIB CHEMO SQ INJECTION 3.5 MG (2.5MG/ML)
1.3000 mg/m2 | Freq: Once | INTRAMUSCULAR | Status: AC
  Administered 2021-10-14: 3 mg via SUBCUTANEOUS
  Filled 2021-10-14: qty 1.2

## 2021-10-14 MED ORDER — SODIUM CHLORIDE 0.9 % IV SOLN: Freq: Once | INTRAVENOUS | Status: AC

## 2021-10-14 NOTE — Patient Instructions (Signed)
[facility] [place]  Discharge Instructions: Thank you for choosing [facility] to provide your oncology and hematology care.   If you have a lab appointment with the [facility], please go directly to the [facility] and check in at the registration area.  Wear comfortable clothing and clothing appropriate for easy access to any Portacath or PICC line.   We strive to give you quality time with your provider. You may need to reschedule your appointment if you arrive late (15 or more minutes).  Arriving late affects you and other patients whose appointments are after yours.  Also, if you miss three or more appointments without notifying the office, you may be dismissed from the clinic at the providers discretion.      For prescription refill requests, have your pharmacy contact our office and allow 72 hours for refills to be completed.   Today you received the following chemotherapy and/or immunotherapy agents velcade,  cytoxan    To help prevent nausea and vomiting after your treatment, we encourage you to take your nausea medication as directed.  BELOW ARE SYMPTOMS THAT SHOULD BE REPORTED IMMEDIATELY: *FEVER GREATER THAN 100.4 F (38 C) OR HIGHER *CHILLS OR SWEATING *NAUSEA AND VOMITING THAT IS NOT CONTROLLED WITH YOUR NAUSEA MEDICATION *UNUSUAL SHORTNESS OF BREATH *UNUSUAL BRUISING OR BLEEDING *URINARY PROBLEMS (pain or burning when urinating, or frequent urination) *BOWEL PROBLEMS (unusual diarrhea, constipation, pain near the anus) TENDERNESS IN MOUTH AND THROAT WITH OR WITHOUT PRESENCE OF ULCERS (sore throat, sores in mouth, or a toothache) UNUSUAL RASH, SWELLING OR PAIN  UNUSUAL VAGINAL DISCHARGE OR ITCHING   Items with * indicate a potential emergency and should be followed up as soon as possible or go to the Emergency Department if any problems should occur.  Please show the CHEMOTHERAPY ALERT CARD or IMMUNOTHERAPY ALERT CARD at check-in to  the Emergency Department and triage nurse. Should you have questions after your visit or need to cancel or reschedule your appointment, please contact [facility]  [phone] and follow the prompts.  Office hours are 8:00 a.m. to 4:30 p.m. Monday - Friday. Please note that voicemails left after 4:00 p.m. may not be returned until the following business day.  We are closed weekends and major holidays. You have access to a nurse at all times for urgent questions. Please call the main number to the clinic [phone] and follow the prompts.  For any non-urgent questions, you may also contact your provider using MyChart. We now offer e-Visits for anyone 10 and older to request care online for non-urgent symptoms. For details visit [email or web address].   Also download the MyChart app! Go to the app store, search "MyChart", open the app, select [facility], and log in with your MyChart username and password.  Due to Covid, a mask is required upon entering the hospital/clinic. If you do not have a mask, one will be given to you upon arrival. For doctor visits, patients may have 1 support person aged 73 or older with them. For treatment visits, patients cannot have anyone with them due to current Covid guidelines and our immunocompromised population.

## 2021-10-19 ENCOUNTER — Telehealth: Payer: Self-pay

## 2021-10-19 NOTE — Telephone Encounter (Signed)
Pharmacy fax request for Amiodarone 100 mg tablets - tk 1 tab po qd. That is what is on last Eric [name] OV med list however now the snapshot also has a 200 mg tab - tk 1 tab po qd, that was added as a historical med. I called pt's home and cell number and both VMs were full. Please advise. Thanks!

## 2021-10-19 NOTE — Telephone Encounter (Signed)
Pt has 2 different prescriptions for amiodarone.  Dr. Lovena Le prescribed amiodarone 100 mg PO daily.  Will confirm with Pt.

## 2021-10-26 MED ORDER — AMIODARONE HCL 100 MG PO TABS: 100.0000 mg | ORAL_TABLET | Freq: Every day | ORAL | 0 refills | Status: DC

## 2021-10-26 NOTE — Telephone Encounter (Signed)
Per Pt-he is on amiodarone 100 mg daily.  Corrected in Pt's chart.  Refill sent.

## 2021-11-04 ENCOUNTER — Other Ambulatory Visit: Payer: Self-pay | Admitting: *Deleted

## 2021-11-04 ENCOUNTER — Inpatient Hospital Stay: Payer: Medicare Other | Attending: Family | Admitting: Hematology & Oncology

## 2021-11-04 ENCOUNTER — Other Ambulatory Visit: Payer: Self-pay

## 2021-11-04 ENCOUNTER — Inpatient Hospital Stay: Payer: Medicare Other

## 2021-11-04 ENCOUNTER — Encounter: Payer: Self-pay | Admitting: Hematology & Oncology

## 2021-11-04 VITALS — BP 138/91 | HR 80 | Temp 98.2°F | Resp 18 | Ht 69.0 in | Wt 208.0 lb

## 2021-11-04 DIAGNOSIS — N289 Disorder of kidney and ureter, unspecified: Secondary | ICD-10-CM | POA: Diagnosis not present

## 2021-11-04 DIAGNOSIS — C9000 Multiple myeloma not having achieved remission: Secondary | ICD-10-CM

## 2021-11-04 DIAGNOSIS — Z992 Dependence on renal dialysis: Secondary | ICD-10-CM | POA: Diagnosis not present

## 2021-11-04 DIAGNOSIS — Z5111 Encounter for antineoplastic chemotherapy: Secondary | ICD-10-CM | POA: Insufficient documentation

## 2021-11-04 DIAGNOSIS — M7989 Other specified soft tissue disorders: Secondary | ICD-10-CM | POA: Insufficient documentation

## 2021-11-04 DIAGNOSIS — Z5112 Encounter for antineoplastic immunotherapy: Secondary | ICD-10-CM | POA: Insufficient documentation

## 2021-11-04 DIAGNOSIS — Z79899 Other long term (current) drug therapy: Secondary | ICD-10-CM | POA: Insufficient documentation

## 2021-11-04 LAB — CMP (CANCER CENTER ONLY)
ALT: 24 U/L (ref 0–44)
AST: 27 U/L (ref 15–41)
Albumin: 3.4 g/dL — ABNORMAL LOW (ref 3.5–5.0)
Alkaline Phosphatase: 68 U/L (ref 38–126)
Anion gap: 8 (ref 5–15)
BUN: 30 mg/dL — ABNORMAL HIGH (ref 8–23)
CO2: 26 mmol/L (ref 22–32)
Calcium: 8.7 mg/dL — ABNORMAL LOW (ref 8.9–10.3)
Chloride: 109 mmol/L (ref 98–111)
Creatinine: 1.87 mg/dL — ABNORMAL HIGH (ref 0.61–1.24)
GFR, Estimated: 38 mL/min — ABNORMAL LOW (ref >60)
Glucose, Bld: 116 mg/dL — ABNORMAL HIGH (ref 70–99)
Potassium: 3.7 mmol/L (ref 3.5–5.1)
Sodium: 143 mmol/L (ref 135–145)
Total Bilirubin: 0.9 mg/dL (ref 0.3–1.2)
Total Protein: 5.3 g/dL — ABNORMAL LOW (ref 6.5–8.1)

## 2021-11-04 LAB — CBC WITH DIFFERENTIAL (CANCER CENTER ONLY)
Abs Immature Granulocytes: 0.01 10*3/uL (ref 0.00–0.07)
Basophils Absolute: 0 10*3/uL (ref 0.0–0.1)
Basophils Relative: 0 %
Eosinophils Absolute: 0.1 10*3/uL (ref 0.0–0.5)
Eosinophils Relative: 2 %
HCT: 35.4 % — ABNORMAL LOW (ref 39.0–52.0)
Hemoglobin: 11.4 g/dL — ABNORMAL LOW (ref 13.0–17.0)
Immature Granulocytes: 0 %
Lymphocytes Relative: 8 %
Lymphs Abs: 0.4 10*3/uL — ABNORMAL LOW (ref 0.7–4.0)
MCH: 29.2 pg (ref 26.0–34.0)
MCHC: 32.2 g/dL (ref 30.0–36.0)
MCV: 90.8 fL (ref 80.0–100.0)
Monocytes Absolute: 0.8 10*3/uL (ref 0.1–1.0)
Monocytes Relative: 18 %
Neutro Abs: 3.3 10*3/uL (ref 1.7–7.7)
Neutrophils Relative %: 72 %
Platelet Count: 134 10*3/uL — ABNORMAL LOW (ref 150–400)
RBC: 3.9 MIL/uL — ABNORMAL LOW (ref 4.22–5.81)
RDW: 18.6 % — ABNORMAL HIGH (ref 11.5–15.5)
WBC Count: 4.7 10*3/uL (ref 4.0–10.5)
nRBC: 0 % (ref 0.0–0.2)

## 2021-11-04 LAB — LACTATE DEHYDROGENASE: LDH: 237 U/L — ABNORMAL HIGH (ref 98–192)

## 2021-11-04 MED ORDER — SODIUM CHLORIDE 0.9 % IV SOLN: Freq: Once | INTRAVENOUS | Status: AC

## 2021-11-04 MED ORDER — SODIUM CHLORIDE 0.9 % IV SOLN
400.0000 mg/m2 | Freq: Once | INTRAVENOUS | Status: AC
  Administered 2021-11-04: 900 mg via INTRAVENOUS
  Filled 2021-11-04: qty 30

## 2021-11-04 MED ORDER — PALONOSETRON HCL INJECTION 0.25 MG/5ML
0.2500 mg | Freq: Once | INTRAVENOUS | Status: AC
  Administered 2021-11-04: 0.25 mg via INTRAVENOUS
  Filled 2021-11-04: qty 5

## 2021-11-04 MED ORDER — BORTEZOMIB CHEMO SQ INJECTION 3.5 MG (2.5MG/ML)
1.3000 mg/m2 | Freq: Once | INTRAMUSCULAR | Status: AC
  Administered 2021-11-04: 3 mg via SUBCUTANEOUS
  Filled 2021-11-04: qty 1.2

## 2021-11-04 MED ORDER — SODIUM CHLORIDE 0.9 % IV SOLN
20.0000 mg | Freq: Once | INTRAVENOUS | Status: AC
  Administered 2021-11-04: 20 mg via INTRAVENOUS
  Filled 2021-11-04: qty 20

## 2021-11-04 MED ORDER — FAMCICLOVIR 500 MG PO TABS: 500.0000 mg | ORAL_TABLET | Freq: Every day | ORAL | 3 refills | Status: DC

## 2021-11-04 NOTE — Progress Notes (Signed)
Ok to treat with Creatinine of 1.87 mg/dl per dr Marin Olp

## 2021-11-04 NOTE — Progress Notes (Signed)
Hematology and Oncology Follow Up Visit  Eric Decarlo., MD [id number] 1951-07-24 71 y.o. 11/04/2021   Principle Diagnosis:  IgG kappa myeloma  - 1q+   Current Therapy:        CyBorD -- s/p cycle 11 - started on 06/04/2020   Interim History:  Eric [name] is here today for follow-up and treatment.  So far, he has been doing pretty well.  He is still working.  He actually had a ophthalmology procedure that he did today.  He has had no problems with respect to work.  His wife is doing a little better.  She is on dialysis now.  He has responded very nicely to treatment.  He is on the CyBorD regimen.  There is no monoclonal spike in his blood last time we checked.  His IgG level was 415 mg/dL.  The Kappa light chain was 1.9 mg/dL.  He has had no problems with his kidneys.  He has moderate renal insufficiency.  This has been holding steady.  He does have a high uric acid level today.  I told him to go ahead and get back on to his allopurinol.  He has had no bleeding.  There has been no cough or shortness of breath.  He has had no headache.  There is been chronic leg swelling.  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      Medications:  Allergies as of 11/04/2021       Reactions   Lactose Intolerance (gi) Diarrhea        Medication List        Accurate as of November 04, 2021  1:17 PM. If you have any questions, ask your nurse or doctor.          acetaminophen 500 MG tablet Commonly known as: TYLENOL Take 1,000 mg by mouth every 4 (four) hours as needed for moderate pain or headache.   allopurinol 100 MG tablet Commonly known as: ZYLOPRIM Take 1 tab by mouth every other day   amiodarone 100 MG tablet Commonly known as: PACERONE Take 1 tablet (100 mg total) by mouth daily.   apixaban 5 MG Tabs tablet Commonly known as: Eliquis Take 1 tablet (5 mg total) by mouth 2 (two) times daily.   ARTIFICIAL TEARS OP Place 1 drop into both eyes daily as needed (dry  eyes).   benzonatate 100 MG capsule Commonly known as: Tessalon Perles Take 1 capsule (100 mg total) by mouth 3 (three) times daily as needed for cough.   carvedilol 6.25 MG tablet Commonly known as: COREG TAKE 1 TABLET(6.25 MG) BY MOUTH TWICE DAILY   docusate sodium 100 MG capsule Commonly known as: COLACE Take 100 mg by mouth daily as needed for mild constipation.   famciclovir 500 MG tablet Commonly known as: FAMVIR Take 1 tablet (500 mg total) by mouth daily.   fluticasone 50 MCG/ACT nasal spray Commonly known as: FLONASE Place 2 sprays into both nostrils daily.   furosemide 80 MG tablet Commonly known as: LASIX TAKE 1 TABLET(80 MG) BY MOUTH DAILY   ipratropium 0.06 % nasal spray Commonly known as: ATROVENT Place 2 sprays into both nostrils 2 (two) times daily.   isosorbide-hydrALAZINE 20-37.5 MG tablet Commonly known as: BIDIL TAKE 1 TABLET BY MOUTH THREE TIMES DAILY What changed:  how much to take how to take this when to take this additional instructions   levothyroxine 75 MCG tablet Commonly known as: SYNTHROID TAKE 1 TABLET BY MOUTH DAILY IN THE MORNING.  EXCEPT TAKE 1/2 TABLET ON SUNDAYS IN THE MORNING   loperamide 2 MG capsule Commonly known as: IMODIUM Take 2 mg by mouth as needed for diarrhea or loose stools.   ondansetron 8 MG tablet Commonly known as: Zofran Take 1 tablet (8 mg total) by mouth 2 (two) times daily as needed for refractory nausea / vomiting. Start on day 3 after Cytoxan.   potassium chloride 20 MEQ/15ML (10%) Soln TAKE 15 ML BY MOUTH TWICE DAILY   prochlorperazine 10 MG tablet Commonly known as: COMPAZINE Take 1 tablet (10 mg total) by mouth every 6 (six) hours as needed (Nausea or vomiting).   rosuvastatin 20 MG tablet Commonly known as: CRESTOR Take 1 tablet (20 mg total) by mouth daily.   vitamin B-12 100 MCG tablet Commonly known as: CYANOCOBALAMIN Take 100 mcg by mouth daily.   vitamin C 500 MG tablet Commonly known  as: ASCORBIC ACID Take 500 mg by mouth daily.   VITAMIN D PO Take 1 capsule by mouth daily.        Allergies:  Allergies  Allergen Reactions   Lactose Intolerance (Gi) Diarrhea    Past Medical History, Surgical history, Social history, and Family History were reviewed and updated.  Review of Systems: Review of Systems  Constitutional: Negative.   Eyes: Negative.   Respiratory: Negative.    Cardiovascular:  Positive for leg swelling.  Gastrointestinal: Negative.   Genitourinary: Negative.   Musculoskeletal: Negative.   Skin: Negative.   Neurological: Negative.   Endo/Heme/Allergies: Negative.   Psychiatric/Behavioral: Negative.      Physical Exam:  height is 5\' 9"  (1.753 m) and weight is 208 lb (94.3 kg). His oral temperature is 98.2 F (36.8 C). His blood pressure is 138/91 (abnormal) and his pulse is 80. His respiration is 18 and oxygen saturation is 99%.   Wt Readings from Last 3 Encounters:  11/04/21 208 lb (94.3 kg)  09/23/21 205 lb (93 kg)  08/10/21 215 lb 3.2 oz (97.6 kg)   His vital signs show temperature of 98.2.  Pulse 61.  Blood pressure 126/88.  Weight is 205 pounds.  Physical Exam Vitals reviewed.  HENT:     Head: Normocephalic and atraumatic.  Eyes:     Pupils: Pupils are equal, round, and reactive to light.  Cardiovascular:     Rate and Rhythm: Normal rate and regular rhythm.     Heart sounds: Normal heart sounds.  Pulmonary:     Effort: Pulmonary effort is normal.     Breath sounds: Normal breath sounds.  Abdominal:     General: Bowel sounds are normal.     Palpations: Abdomen is soft.  Musculoskeletal:        General: No tenderness or deformity. Normal range of motion.     Cervical back: Normal range of motion.  Lymphadenopathy:     Cervical: No cervical adenopathy.  Skin:    General: Skin is warm and dry.     Findings: No erythema or rash.  Neurological:     Mental Status: He is alert and oriented to person, place, and time.   Psychiatric:        Behavior: Behavior normal.        Thought Content: Thought content normal.        Judgment: Judgment normal.      Lab Results  Component Value Date   WBC 4.7 11/04/2021   HGB 11.4 (L) 11/04/2021   HCT 35.4 (L) 11/04/2021   MCV 90.8 11/04/2021  PLT 134 (L) 11/04/2021   Lab Results  Component Value Date   FERRITIN 77 08/11/2020   IRON 39 (L) 08/11/2020   TIBC 400 08/11/2020   UIBC 361 08/11/2020   IRONPCTSAT 10 (L) 08/11/2020   Lab Results  Component Value Date   RETICCTPCT 0.4 08/11/2020   RBC 3.90 (L) 11/04/2021   Lab Results  Component Value Date   KPAFRELGTCHN 19.1 09/23/2021   LAMBDASER 20.7 09/23/2021   KAPLAMBRATIO 0.92 09/23/2021   Lab Results  Component Value Date   IGGSERUM 415 (L) 09/23/2021   IGA 86 09/23/2021   IGMSERUM 13 (L) 09/23/2021   Lab Results  Component Value Date   TOTALPROTELP 5.2 (L) 09/23/2021   ALBUMINELP 3.1 09/23/2021   A1GS 0.3 09/23/2021   A2GS 0.7 09/23/2021   BETS 0.8 09/23/2021   GAMS 0.3 (L) 09/23/2021   MSPIKE Not Observed 09/23/2021   SPEI Comment 04/22/2021     Chemistry      Component Value Date/Time   NA 143 11/04/2021 0827   NA 146 (H) 03/31/2021 1707   K 3.7 11/04/2021 0827   CL 109 11/04/2021 0827   CO2 26 11/04/2021 0827   BUN 30 (H) 11/04/2021 0827   BUN 31 (H) 03/31/2021 1707   CREATININE 1.87 (H) 11/04/2021 0827   CREATININE 1.95 (H) 09/08/2016 0859      Component Value Date/Time   CALCIUM 8.7 (L) 11/04/2021 0827   ALKPHOS 68 11/04/2021 0827   AST 27 11/04/2021 0827   ALT 24 11/04/2021 0827   BILITOT 0.9 11/04/2021 0827       Impression and Plan: Eric [name] is a very nice 71 yo African American gentleman with IgG kappa myeloma.    He does not wish to have a stem cell transplant.  I realize this is a "gray area" given his age and overall performance status.  He really wants to make sure that his wife is taken care of.  We will proceed with treatment today.  Again he is  doing quite nicely.  I am just happy that his quality of life is well and that he can take care of his wife which is his priority.  For right now, we will go ahead and plan for another follow-up in 1 month.  His treatment intervals are 1 month which agrees with his schedule.    Volanda Napoleon, MD 2/10/20231:17 PM

## 2021-11-04 NOTE — Patient Instructions (Signed)
[facility]  Discharge Instructions: Thank you for choosing [facility] to provide your oncology and hematology care.   If you have a lab appointment with the [facility], please go directly to the [facility] and check in at the registration area.  Wear comfortable clothing and clothing appropriate for easy access to any Portacath or PICC line.   We strive to give you quality time with your provider. You may need to reschedule your appointment if you arrive late (15 or more minutes).  Arriving late affects you and other patients whose appointments are after yours.  Also, if you miss three or more appointments without notifying the office, you may be dismissed from the clinic at the providers discretion.      For prescription refill requests, have your pharmacy contact our office and allow 72 hours for refills to be completed.    Today you received the following chemotherapy and/or immunotherapy agents cytoxan, Velcade      To help prevent nausea and vomiting after your treatment, we encourage you to take your nausea medication as directed.  BELOW ARE SYMPTOMS THAT SHOULD BE REPORTED IMMEDIATELY: *FEVER GREATER THAN 100.4 F (38 C) OR HIGHER *CHILLS OR SWEATING *NAUSEA AND VOMITING THAT IS NOT CONTROLLED WITH YOUR NAUSEA MEDICATION *UNUSUAL SHORTNESS OF BREATH *UNUSUAL BRUISING OR BLEEDING *URINARY PROBLEMS (pain or burning when urinating, or frequent urination) *BOWEL PROBLEMS (unusual diarrhea, constipation, pain near the anus) TENDERNESS IN MOUTH AND THROAT WITH OR WITHOUT PRESENCE OF ULCERS (sore throat, sores in mouth, or a toothache) UNUSUAL RASH, SWELLING OR PAIN  UNUSUAL VAGINAL DISCHARGE OR ITCHING   Items with * indicate a potential emergency and should be followed up as soon as possible or go to the Emergency Department if any problems should occur.  Please show the CHEMOTHERAPY ALERT CARD or IMMUNOTHERAPY ALERT CARD at check-in  to the Emergency Department and triage nurse. Should you have questions after your visit or need to cancel or reschedule your appointment, please contact [facility]  [phone] and follow the prompts.  Office hours are 8:00 a.m. to 4:30 p.m. Monday - Friday. Please note that voicemails left after 4:00 p.m. may not be returned until the following business day.  We are closed weekends and major holidays. You have access to a nurse at all times for urgent questions. Please call the main number to the clinic [phone] and follow the prompts.  For any non-urgent questions, you may also contact your provider using MyChart. We now offer e-Visits for anyone 74 and older to request care online for non-urgent symptoms. For details visit [email or web address].   Also download the MyChart app! Go to the app store, search "MyChart", open the app, select [facility], and log in with your MyChart username and password.  Due to Covid, a mask is required upon entering the hospital/clinic. If you do not have a mask, one will be given to you upon arrival. For doctor visits, patients may have 1 support person aged 65 or older with them. For treatment visits, patients cannot have anyone with them due to current Covid guidelines and our immunocompromised population.

## 2021-11-05 LAB — IGG, IGA, IGM
IgA: 70 mg/dL (ref 61–437)
IgG (Immunoglobin G), Serum: 410 mg/dL — ABNORMAL LOW (ref 603–1613)
IgM (Immunoglobulin M), Srm: 8 mg/dL — ABNORMAL LOW (ref 20–172)

## 2021-11-07 ENCOUNTER — Telehealth: Payer: Self-pay | Admitting: Hematology & Oncology

## 2021-11-07 LAB — KAPPA/LAMBDA LIGHT CHAINS
Kappa free light chain: 16.2 mg/L (ref 3.3–19.4)
Kappa, lambda light chain ratio: 1.1 (ref 0.26–1.65)
Lambda free light chains: 14.7 mg/L (ref 5.7–26.3)

## 2021-11-07 NOTE — Telephone Encounter (Signed)
Called to schedule per 2/10 los , left voicemail

## 2021-11-09 LAB — PROTEIN ELECTROPHORESIS, SERUM, WITH REFLEX
A/G Ratio: 1.6 (ref 0.7–1.7)
Albumin ELP: 3.1 g/dL (ref 2.9–4.4)
Alpha-1-Globulin: 0.2 g/dL (ref 0.0–0.4)
Alpha-2-Globulin: 0.5 g/dL (ref 0.4–1.0)
Beta Globulin: 0.8 g/dL (ref 0.7–1.3)
Gamma Globulin: 0.4 g/dL (ref 0.4–1.8)
Globulin, Total: 1.9 g/dL — ABNORMAL LOW (ref 2.2–3.9)
SPEP Interpretation: 0
Total Protein ELP: 5 g/dL — ABNORMAL LOW (ref 6.0–8.5)

## 2021-11-09 LAB — IMMUNOFIXATION REFLEX, SERUM
IgA: 73 mg/dL (ref 61–437)
IgG (Immunoglobin G), Serum: 446 mg/dL — ABNORMAL LOW (ref 603–1613)
IgM (Immunoglobulin M), Srm: 8 mg/dL — ABNORMAL LOW (ref 20–172)

## 2021-11-17 ENCOUNTER — Ambulatory Visit: Payer: Medicare Other | Admitting: Internal Medicine

## 2021-12-02 ENCOUNTER — Ambulatory Visit: Payer: Medicare Other | Admitting: Hematology & Oncology

## 2021-12-02 ENCOUNTER — Inpatient Hospital Stay: Payer: Medicare Other

## 2021-12-02 ENCOUNTER — Ambulatory Visit: Payer: Medicare Other

## 2021-12-05 ENCOUNTER — Ambulatory Visit: Payer: Medicare Other | Admitting: Internal Medicine

## 2021-12-13 ENCOUNTER — Ambulatory Visit (INDEPENDENT_AMBULATORY_CARE_PROVIDER_SITE_OTHER): Payer: Medicare Other

## 2021-12-13 DIAGNOSIS — I428 Other cardiomyopathies: Secondary | ICD-10-CM | POA: Diagnosis not present

## 2021-12-14 ENCOUNTER — Ambulatory Visit: Payer: Medicare Other | Admitting: Internal Medicine

## 2021-12-14 LAB — CUP PACEART REMOTE DEVICE CHECK
Battery Remaining Longevity: 23 mo
Battery Remaining Percentage: 28 %
Battery Voltage: 2.89 V
Date Time Interrogation Session: 20230321031438
HighPow Impedance: 55 Ohm
HighPow Impedance: 55 Ohm
Implantable Lead Implant Date: 20170525
Implantable Lead Implant Date: 20170525
Implantable Lead Implant Date: 20170525
Implantable Lead Location: 753858
Implantable Lead Location: 753859
Implantable Lead Location: 753860
Implantable Lead Model: 7122
Implantable Pulse Generator Implant Date: 20170525
Lead Channel Impedance Value: 380 Ohm
Lead Channel Impedance Value: 430 Ohm
Lead Channel Impedance Value: 610 Ohm
Lead Channel Pacing Threshold Amplitude: 0.5 V
Lead Channel Pacing Threshold Amplitude: 1.25 V
Lead Channel Pacing Threshold Pulse Width: 0.5 ms
Lead Channel Pacing Threshold Pulse Width: 0.5 ms
Lead Channel Sensing Intrinsic Amplitude: 0.2 mV
Lead Channel Sensing Intrinsic Amplitude: 11.4 mV
Lead Channel Setting Pacing Amplitude: 2 V
Lead Channel Setting Pacing Amplitude: 2.5 V
Lead Channel Setting Pacing Pulse Width: 0.5 ms
Lead Channel Setting Pacing Pulse Width: 0.5 ms
Lead Channel Setting Sensing Sensitivity: 0.5 mV
Pulse Gen Serial Number: 7357926

## 2021-12-15 ENCOUNTER — Ambulatory Visit: Payer: Medicare Other | Admitting: Internal Medicine

## 2021-12-16 ENCOUNTER — Inpatient Hospital Stay: Payer: Medicare Other

## 2021-12-16 ENCOUNTER — Encounter: Payer: Self-pay | Admitting: *Deleted

## 2021-12-16 ENCOUNTER — Encounter: Payer: Self-pay | Admitting: Hematology & Oncology

## 2021-12-16 ENCOUNTER — Inpatient Hospital Stay: Payer: Medicare Other | Attending: Family

## 2021-12-16 ENCOUNTER — Inpatient Hospital Stay (HOSPITAL_BASED_OUTPATIENT_CLINIC_OR_DEPARTMENT_OTHER): Payer: Medicare Other | Admitting: Hematology & Oncology

## 2021-12-16 ENCOUNTER — Other Ambulatory Visit: Payer: Self-pay

## 2021-12-16 VITALS — BP 129/91 | HR 81 | Temp 98.4°F | Resp 16 | Wt 211.0 lb

## 2021-12-16 DIAGNOSIS — Z5112 Encounter for antineoplastic immunotherapy: Secondary | ICD-10-CM | POA: Diagnosis not present

## 2021-12-16 DIAGNOSIS — M7989 Other specified soft tissue disorders: Secondary | ICD-10-CM | POA: Diagnosis not present

## 2021-12-16 DIAGNOSIS — C9000 Multiple myeloma not having achieved remission: Secondary | ICD-10-CM

## 2021-12-16 DIAGNOSIS — R200 Anesthesia of skin: Secondary | ICD-10-CM | POA: Diagnosis not present

## 2021-12-16 DIAGNOSIS — C9001 Multiple myeloma in remission: Secondary | ICD-10-CM

## 2021-12-16 DIAGNOSIS — R319 Hematuria, unspecified: Secondary | ICD-10-CM

## 2021-12-16 DIAGNOSIS — Z79899 Other long term (current) drug therapy: Secondary | ICD-10-CM | POA: Insufficient documentation

## 2021-12-16 DIAGNOSIS — N184 Chronic kidney disease, stage 4 (severe): Secondary | ICD-10-CM

## 2021-12-16 LAB — CBC WITH DIFFERENTIAL (CANCER CENTER ONLY)
Abs Immature Granulocytes: 0.01 10*3/uL (ref 0.00–0.07)
Basophils Absolute: 0 10*3/uL (ref 0.0–0.1)
Basophils Relative: 0 %
Eosinophils Absolute: 0.1 10*3/uL (ref 0.0–0.5)
Eosinophils Relative: 2 %
HCT: 33.3 % — ABNORMAL LOW (ref 39.0–52.0)
Hemoglobin: 11.1 g/dL — ABNORMAL LOW (ref 13.0–17.0)
Immature Granulocytes: 0 %
Lymphocytes Relative: 7 %
Lymphs Abs: 0.5 10*3/uL — ABNORMAL LOW (ref 0.7–4.0)
MCH: 29.8 pg (ref 26.0–34.0)
MCHC: 33.3 g/dL (ref 30.0–36.0)
MCV: 89.3 fL (ref 80.0–100.0)
Monocytes Absolute: 1.2 10*3/uL — ABNORMAL HIGH (ref 0.1–1.0)
Monocytes Relative: 16 %
Neutro Abs: 5.6 10*3/uL (ref 1.7–7.7)
Neutrophils Relative %: 75 %
Platelet Count: 152 10*3/uL (ref 150–400)
RBC: 3.73 MIL/uL — ABNORMAL LOW (ref 4.22–5.81)
RDW: 18 % — ABNORMAL HIGH (ref 11.5–15.5)
WBC Count: 7.4 10*3/uL (ref 4.0–10.5)
nRBC: 0 % (ref 0.0–0.2)

## 2021-12-16 LAB — CMP (CANCER CENTER ONLY)
ALT: 17 U/L (ref 0–44)
AST: 22 U/L (ref 15–41)
Albumin: 3.4 g/dL — ABNORMAL LOW (ref 3.5–5.0)
Alkaline Phosphatase: 70 U/L (ref 38–126)
Anion gap: 9 (ref 5–15)
BUN: 27 mg/dL — ABNORMAL HIGH (ref 8–23)
CO2: 23 mmol/L (ref 22–32)
Calcium: 8.7 mg/dL — ABNORMAL LOW (ref 8.9–10.3)
Chloride: 107 mmol/L (ref 98–111)
Creatinine: 1.74 mg/dL — ABNORMAL HIGH (ref 0.61–1.24)
GFR, Estimated: 42 mL/min — ABNORMAL LOW (ref >60)
Glucose, Bld: 153 mg/dL — ABNORMAL HIGH (ref 70–99)
Potassium: 3.5 mmol/L (ref 3.5–5.1)
Sodium: 139 mmol/L (ref 135–145)
Total Bilirubin: 1.1 mg/dL (ref 0.3–1.2)
Total Protein: 5.6 g/dL — ABNORMAL LOW (ref 6.5–8.1)

## 2021-12-16 LAB — LACTATE DEHYDROGENASE: LDH: 230 U/L — ABNORMAL HIGH (ref 98–192)

## 2021-12-16 MED ORDER — SODIUM CHLORIDE 0.9 % IV SOLN: Freq: Once | INTRAVENOUS | Status: AC

## 2021-12-16 MED ORDER — BORTEZOMIB CHEMO SQ INJECTION 3.5 MG (2.5MG/ML)
1.3000 mg/m2 | Freq: Once | INTRAMUSCULAR | Status: AC
  Administered 2021-12-16: 3 mg via SUBCUTANEOUS
  Filled 2021-12-16: qty 1.2

## 2021-12-16 MED ORDER — SODIUM CHLORIDE 0.9 % IV SOLN
400.0000 mg/m2 | Freq: Once | INTRAVENOUS | Status: DC
  Filled 2021-12-16: qty 45

## 2021-12-16 MED ORDER — SODIUM CHLORIDE 0.9 % IV SOLN
20.0000 mg | Freq: Once | INTRAVENOUS | Status: AC
  Administered 2021-12-16: 20 mg via INTRAVENOUS
  Filled 2021-12-16: qty 20

## 2021-12-16 MED ORDER — PALONOSETRON HCL INJECTION 0.25 MG/5ML
0.2500 mg | Freq: Once | INTRAVENOUS | Status: AC
  Administered 2021-12-16: 0.25 mg via INTRAVENOUS
  Filled 2021-12-16: qty 5

## 2021-12-16 NOTE — Patient Instructions (Signed)
[facility] [place]  Discharge Instructions: ?Thank you for choosing [facility] to provide your oncology and hematology care.  ? ?If you have a lab appointment with the [facility], please go directly to the [facility] and check in at the registration area. ? ?Wear comfortable clothing and clothing appropriate for easy access to any Port[facility]ath or PICC line.  ? ?We strive to give you quality time with your provider. You may need to reschedule your appointment if you arrive late (15 or more minutes).  Arriving late affects you and other patients whose appointments are after yours.  Also, if you miss three or more appointments without notifying the office, you may be dismissed from the clinic at the provider?s discretion.    ?  ?For prescription refill requests, have your pharmacy contact our office and allow 72 hours for refills to be completed.   ? ?Today you received the following chemotherapy and/or immunotherapy agents cytoxan, velcade, aloxi, decadron   ?  ?To help prevent nausea and vomiting after your treatment, we encourage you to take your nausea medication as directed. ? ?BELOW ARE SYMPTOMS THAT SHOULD BE REPORTED IMMEDIATELY: ?*FEVER GREATER THAN 100.4 F (38 ?C) OR HIGHER ?*CHILLS OR SWEATING ?*NAUSEA AND VOMITING THAT IS NOT CONTROLLED WITH YOUR NAUSEA MEDICATION ?*UNUSUAL SHORTNESS OF BREATH ?*UNUSUAL BRUISING OR BLEEDING ?*URINARY PROBLEMS (pain or burning when urinating, or frequent urination) ?*BOWEL PROBLEMS (unusual diarrhea, constipation, pain near the anus) ?TENDERNESS IN MOUTH AND THROAT WITH OR WITHOUT PRESENCE OF ULCERS (sore throat, sores in mouth, or a toothache) ?UNUSUAL RASH, SWELLING OR PAIN  ?UNUSUAL VAGINAL DISCHARGE OR ITCHING  ? ?Items with * indicate a potential emergency and should be followed up as soon as possible or go to the Emergency Department if any problems should occur. ? ?Please show the CHEMOTHERAPY ALERT CARD or IMMUNOTHERAPY ALERT  CARD at check-in to the Emergency Department and triage nurse. ?Should you have questions after your visit or need to cancel or reschedule your appointment, please contact [facility]  [phone] and follow the prompts.  Office hours are 8:00 a.m. to 4:30 p.m. Monday - Friday. Please note that voicemails left after 4:00 p.m. may not be returned until the following business day.  We are closed weekends and major holidays. You have access to a nurse at all times for urgent questions. Please call the main number to the clinic [phone] and follow the prompts. ? ?For any non-urgent questions, you may also contact your provider using MyChart. We now offer e-Visits for anyone 56 and older to request care online for non-urgent symptoms. For details visit [email or web address]. ?  ?Also download the MyChart app! Go to the app store, search "MyChart", open the app, select [facility], and log in with your MyChart username and password. ? ?Due to Covid, a mask is required upon entering the hospital/clinic. If you do not have a mask, one will be given to you upon arrival. For doctor visits, patients may have 1 support person aged 65 or older with them. For treatment visits, patients cannot have anyone with them due to current Covid guidelines and our immunocompromised population.  ?

## 2021-12-16 NOTE — Progress Notes (Signed)
Okay to treat today with SCr 1.74. ?Will increase SCr treatment parameter to 2 per Dr. Antonieta Pert instructions. ?

## 2021-12-16 NOTE — Progress Notes (Signed)
Pt came back to the office after leaving after his treatment today stating that he voided and noticed that his urine was slightly bloody.  Dr. Marin Olp notified and order received for U/A and culture.  U/A sent as ordered.  ?

## 2021-12-16 NOTE — Progress Notes (Signed)
?Hematology and Oncology Follow Up Visit ? ?Eric [name]., MD ?[id number] ?Aug 02, 1951 71 y.o. ?12/16/2021 ? ? ?Principle Diagnosis:  ?IgG kappa myeloma  - 1q+ ?  ?Current Therapy:        ?CyBorD -- s/p cycle 12 - started on 06/04/2020 ?  ?Interim History:  Eric [name] is here today for follow-up and treatment.  So far, he has been doing pretty well.  He is still working.  He had 3 surgeries this week.  I am so happy that he still able to work. ? ?His wife she has been doing little bit better.  She is on dialysis.  She seems to be more active right now. ? ?He is doing well with his treatment.  He is only on treatment monthly now.  There was no monoclonal spike in his blood last time that we saw him.  His IgG level was 430 mg/dL.  His Kappa light chain is 1.6 mg/dL. ? ?He is eating okay.  He has little bit of skin reaction where he gets the Velcade injections. ? ?He has little bit of numbness on the right side.  He said this happened after he had a cardiac arrest. ? ?He has had no cough.  He has had no fever.  There is been no rashes.  There he has had some leg swelling.  He is on some Lasix.  I think his cardiologist is managing this. ? ?Overall, I would say his performance status is probably ECOG 1.  ? ? ?Medications:  ?Allergies as of 12/16/2021   ? ?   Reactions  ? Lactose Intolerance (gi) Diarrhea  ? ?  ? ?  ?Medication List  ?  ? ?  ? Accurate as of December 16, 2021  1:22 PM. If you have any questions, ask your nurse or doctor.  ?  ?  ? ?  ? ?acetaminophen 500 MG tablet ?Commonly known as: TYLENOL ?Take 1,000 mg by mouth every 4 (four) hours as needed for moderate pain or headache. ?  ?allopurinol 100 MG tablet ?Commonly known as: ZYLOPRIM ?Take 1 tab by mouth every other day ?  ?amiodarone 100 MG tablet ?Commonly known as: PACERONE ?Take 1 tablet (100 mg total) by mouth daily. ?  ?apixaban 5 MG Tabs tablet ?Commonly known as: Eliquis ?Take 1 tablet (5 mg total) by mouth 2 (two) times daily. ?  ?ARTIFICIAL TEARS  OP ?Place 1 drop into both eyes daily as needed (dry eyes). ?  ?benzonatate 100 MG capsule ?Commonly known as: Best boy ?Take 1 capsule (100 mg total) by mouth 3 (three) times daily as needed for cough. ?  ?carvedilol 6.25 MG tablet ?Commonly known as: COREG ?TAKE 1 TABLET(6.25 MG) BY MOUTH TWICE DAILY ?  ?docusate sodium 100 MG capsule ?Commonly known as: COLACE ?Take 100 mg by mouth daily as needed for mild constipation. ?  ?famciclovir 500 MG tablet ?Commonly known as: FAMVIR ?Take 1 tablet (500 mg total) by mouth daily. ?  ?fluticasone 50 MCG/ACT nasal spray ?Commonly known as: FLONASE ?Place 2 sprays into both nostrils daily. ?  ?furosemide 80 MG tablet ?Commonly known as: LASIX ?TAKE 1 TABLET(80 MG) BY MOUTH DAILY ?  ?ipratropium 0.06 % nasal spray ?Commonly known as: ATROVENT ?Place 2 sprays into both nostrils 2 (two) times daily. ?  ?isosorbide-hydrALAZINE 20-37.5 MG tablet ?Commonly known as: BIDIL ?TAKE 1 TABLET BY MOUTH THREE TIMES DAILY ?What changed:  ?how much to take ?how to take this ?when to take this ?additional instructions ?  ?  levothyroxine 75 MCG tablet ?Commonly known as: SYNTHROID ?TAKE 1 TABLET BY MOUTH DAILY IN THE MORNING. EXCEPT TAKE 1/2 TABLET ON SUNDAYS IN THE MORNING ?  ?loperamide 2 MG capsule ?Commonly known as: IMODIUM ?Take 2 mg by mouth as needed for diarrhea or loose stools. ?  ?ondansetron 8 MG tablet ?Commonly known as: Zofran ?Take 1 tablet (8 mg total) by mouth 2 (two) times daily as needed for refractory nausea / vomiting. Start on day 3 after Cytoxan. ?  ?potassium chloride 20 MEQ/15ML (10%) Soln ?TAKE 15 ML BY MOUTH TWICE DAILY ?  ?prochlorperazine 10 MG tablet ?Commonly known as: COMPAZINE ?Take 1 tablet (10 mg total) by mouth every 6 (six) hours as needed (Nausea or vomiting). ?  ?rosuvastatin 20 MG tablet ?Commonly known as: CRESTOR ?Take 1 tablet (20 mg total) by mouth daily. ?  ?vitamin B-12 100 MCG tablet ?Commonly known as: CYANOCOBALAMIN ?Take 100 mcg by  mouth daily. ?  ?vitamin C 500 MG tablet ?Commonly known as: ASCORBIC ACID ?Take 500 mg by mouth daily. ?  ?VITAMIN D PO ?Take 1 capsule by mouth daily. ?  ? ?  ? ? ?Allergies:  ?Allergies  ?Allergen Reactions  ? Lactose Intolerance (Gi) Diarrhea  ? ? ?Past Medical History, Surgical history, Social history, and Family History were reviewed and updated. ? ?Review of Systems: ?Review of Systems  ?Constitutional: Negative.   ?Eyes: Negative.   ?Respiratory: Negative.    ?Cardiovascular:  Positive for leg swelling.  ?Gastrointestinal: Negative.   ?Genitourinary: Negative.   ?Musculoskeletal: Negative.   ?Skin: Negative.   ?Neurological: Negative.   ?Endo/Heme/Allergies: Negative.   ?Psychiatric/Behavioral: Negative.    ? ? ?Physical Exam: ? weight is 211 lb (95.7 kg). His oral temperature is 98.4 ?F (36.9 ?C). His blood pressure is 129/91 (abnormal) and his pulse is 81. His respiration is 16 and oxygen saturation is 98%.  ? ?Wt Readings from Last 3 Encounters:  ?12/16/21 211 lb (95.7 kg)  ?11/04/21 208 lb (94.3 kg)  ?09/23/21 205 lb (93 kg)  ? ?His vital signs show temperature of 98.2.  Pulse 61.  Blood pressure 126/88.  Weight is 205 pounds. ? ?Physical Exam ?Vitals reviewed.  ?HENT:  ?   Head: Normocephalic and atraumatic.  ?Eyes:  ?   Pupils: Pupils are equal, round, and reactive to light.  ?Cardiovascular:  ?   Rate and Rhythm: Normal rate and regular rhythm.  ?   Heart sounds: Normal heart sounds.  ?Pulmonary:  ?   Effort: Pulmonary effort is normal.  ?   Breath sounds: Normal breath sounds.  ?Abdominal:  ?   General: Bowel sounds are normal.  ?   Palpations: Abdomen is soft.  ?Musculoskeletal:     ?   General: No tenderness or deformity. Normal range of motion.  ?   Cervical back: Normal range of motion.  ?Lymphadenopathy:  ?   Cervical: No cervical adenopathy.  ?Skin: ?   General: Skin is warm and dry.  ?   Findings: No erythema or rash.  ?Neurological:  ?   Mental Status: He is alert and oriented to person,  place, and time.  ?Psychiatric:     ?   Behavior: Behavior normal.     ?   Thought Content: Thought content normal.     ?   Judgment: Judgment normal.  ? ? ? ? ?Lab Results  ?Component Value Date  ? WBC 7.4 12/16/2021  ? HGB 11.1 (L) 12/16/2021  ? HCT 33.3 (L)  12/16/2021  ? MCV 89.3 12/16/2021  ? PLT 152 12/16/2021  ? ?Lab Results  ?Component Value Date  ? FERRITIN 77 08/11/2020  ? IRON 39 (L) 08/11/2020  ? TIBC 400 08/11/2020  ? UIBC 361 08/11/2020  ? IRONPCTSAT 10 (L) 08/11/2020  ? ?Lab Results  ?Component Value Date  ? RETICCTPCT 0.4 08/11/2020  ? RBC 3.73 (L) 12/16/2021  ? ?Lab Results  ?Component Value Date  ? KPAFRELGTCHN 16.2 11/04/2021  ? LAMBDASER 14.7 11/04/2021  ? KAPLAMBRATIO 1.10 11/04/2021  ? ?Lab Results  ?Component Value Date  ? IGGSERUM 410 (L) 11/04/2021  ? IGGSERUM 446 (L) 11/04/2021  ? IGA 70 11/04/2021  ? IGA 73 11/04/2021  ? IGMSERUM 8 (L) 11/04/2021  ? IGMSERUM 8 (L) 11/04/2021  ? ?Lab Results  ?Component Value Date  ? TOTALPROTELP 5.0 (L) 11/04/2021  ? ALBUMINELP 3.1 11/04/2021  ? A1GS 0.2 11/04/2021  ? A2GS 0.5 11/04/2021  ? BETS 0.8 11/04/2021  ? GAMS 0.4 11/04/2021  ? MSPIKE Not Observed 11/04/2021  ? SPEI Comment 04/22/2021  ? ?  Chemistry   ?   ?Component Value Date/Time  ? NA 139 12/16/2021 0808  ? NA 146 (H) 03/31/2021 1707  ? K 3.5 12/16/2021 0808  ? CL 107 12/16/2021 0808  ? CO2 23 12/16/2021 0808  ? BUN 27 (H) 12/16/2021 3086  ? BUN 31 (H) 03/31/2021 1707  ? CREATININE 1.74 (H) 12/16/2021 5784  ? CREATININE 1.95 (H) 09/08/2016 0859  ?    ?Component Value Date/Time  ? CALCIUM 8.7 (L) 12/16/2021 6962  ? ALKPHOS 70 12/16/2021 0808  ? AST 22 12/16/2021 0808  ? ALT 17 12/16/2021 0808  ? BILITOT 1.1 12/16/2021 9528  ?  ? ? ? ?Impression and Plan: Eric [name] is a very nice 71 yo African American gentleman with IgG kappa myeloma.  He has birthday a couple weeks ago.  This was a big birthday for him.  I am so happy that he was able to enjoy it and celebrate. ? ?He does not wish to have a  stem cell transplant.  I realize this is a "gray area" given his age and overall performance status.  He really wants to make sure that his wife is taken care of. ? ?We will proceed with treatment today.  Roslynn Amble

## 2021-12-17 LAB — URINE CULTURE: Culture: NO GROWTH

## 2021-12-17 LAB — IGG, IGA, IGM
IgA: 87 mg/dL (ref 61–437)
IgG (Immunoglobin G), Serum: 470 mg/dL — ABNORMAL LOW (ref 603–1613)
IgM (Immunoglobulin M), Srm: 11 mg/dL — ABNORMAL LOW (ref 20–172)

## 2021-12-19 LAB — PROTEIN ELECTROPHORESIS, SERUM, WITH REFLEX
A/G Ratio: 1.3 (ref 0.7–1.7)
Albumin ELP: 3 g/dL (ref 2.9–4.4)
Alpha-1-Globulin: 0.3 g/dL (ref 0.0–0.4)
Alpha-2-Globulin: 0.7 g/dL (ref 0.4–1.0)
Beta Globulin: 0.9 g/dL (ref 0.7–1.3)
Gamma Globulin: 0.4 g/dL (ref 0.4–1.8)
Globulin, Total: 2.3 g/dL (ref 2.2–3.9)
Total Protein ELP: 5.3 g/dL — ABNORMAL LOW (ref 6.0–8.5)

## 2021-12-19 LAB — KAPPA/LAMBDA LIGHT CHAINS
Kappa free light chain: 19.7 mg/L — ABNORMAL HIGH (ref 3.3–19.4)
Kappa, lambda light chain ratio: 1.11 (ref 0.26–1.65)
Lambda free light chains: 17.8 mg/L (ref 5.7–26.3)

## 2021-12-20 NOTE — Telephone Encounter (Signed)
This encounter was created in error - please disregard.

## 2021-12-23 ENCOUNTER — Telehealth: Payer: Self-pay

## 2021-12-23 NOTE — Telephone Encounter (Signed)
Patient states that his wife has an appointment with Dr. Tamala Julian on Tuesday 4/4. He would like to know if Dr. Tamala Julian can see him at the same time. He does not have any concerns but states that he is overdue for a follow up.  ?Offered the patient an opening on Monday but he is unavailable at that time. Advised that I would send a message to his nurse who will call him back when she returns next week. ?

## 2021-12-26 NOTE — Progress Notes (Signed)
?Cardiology Office Note:   ? ?Date:  12/27/2021  ? 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 ?PCP:  Glendale Chard, MD  ?Cardiologist:  Sinclair Grooms, MD  ? ?Referring MD: Glendale Chard, MD  ? ?Chief Complaint  ?Patient presents with  ? Congestive Heart Failure  ? Hypertension  ? Atrial Fibrillation  ? Hospitalization Follow-up  ?  Chronic anticoagulation ?Chronic amiodarone  ? ? ?History of Present Illness:   ? 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 ? ?Last seen by me in January 2022.  Saw Dr. Lovena Le in April 2022.  Has subsequently undergone and continues to receive chemotherapy for multiple myeloma managed by Dr. Marin Olp.  The patient is on chemotherapy with current regimen.  He does not wish to have a stem cell transplant.  He is on ?CyBorD?, received monthly.  This may be incorrect and it is difficult for me to tell from the list exactly what he gets monthly. ? ? ?He is now starting to notice some lower extremity edema.  He denies shortness of breath.  He is able to lie flat.  No angina.  No bleeding on anticoagulation therapy. ? ?Past Medical History:  ?Diagnosis Date  ? Acute blood loss anemia   ? Acute encephalopathy   ? Acute hypoxemic respiratory failure ([place])   ? Acute idiopathic gout of right ankle   ? Acute lumbar back pain   ? Acute on chronic systolic heart failure ([facility])   ?  Acute respiratory failure ([place])   ? AKI (acute kidney injury) ([place])   ? Altered mental status   ? Aortic aneurysm without rupture ([place]) 02/09/2016  ? Aortic root enlargement ([place]) 02/13/2012  ? Aortic valve regurgitation, acquired 02/13/2012  ? Arrhythmia   ? Atrial flutter ([place]) 03/18/2012  ? Back pain   ? Cardiac arrest ([place]) 02/01/2016  ? CHF (congestive heart failure) ([place])   ? Chronic anticoagulation 03/04/2013  ? Chronic kidney disease   ? kidney fx studies increased   ? Chronic lower back pain   ? Chronic renal insufficiency   ? Chronic systolic heart failure ([place]) 02/13/2012  ? Recent diagnosis 4 / 2013, LVEF 25% by Echo 12/2011  03/2013: Echo at [facility] Cardiology Conclusions: 1. Left ventricular ejection fraction estimated by 2D at 40-45 percent. 2. Mild concentric left ventricular hypertrophy. 3. Mild left atrial enlargement. 4. Moderate aortic valve regurgitation. 5. The aortic root at the sinus(es) of valsalva is moderately dilated 6. Mild mitral valve regurgitation. 7.  ? CKD (chronic kidney disease)   ? CKD (chronic kidney disease), stage IV ([place]) 08/06/2013  ? Creatinine 2.4 on 07/04/13   ? Claustrophobia   ? Colon cancer screening 03/04/2013  ? Debility 02/22/2016  ? Diabetes mellitus type 2 in nonobese [facility])   ? Diabetes mellitus type 2 in obese [facility])   ? Dysrhythmia   ? "palpitations"  ? Encounter for central line placement   ? Exertional dyspnea 01/2012  ? 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  Femoral neuropathy   ? Goals of care, counseling/discussion 05/05/2020  ? HCAP (healthcare-associated pneumonia)   ? Heart murmur   ? Hyperlipidemia 02/13/2012  ? Hypertension   ? Hypothyroidism   ? Internal hemorrhoids without mention of complication 1/49/7026  ? Labile blood pressure   ? Left bundle branch block 02/13/2012  ? Leg weakness, bilateral   ? Long term (current) use of anticoagulants 08/06/2013  ? Eliquis therapy   ? Long term current use of amiodarone 08/07/2016  ? Lower extremity weakness   ? Migraine 02/13/12  ?  "opthalmic"  ? Multiple myeloma ([place]) 05/05/2020  ? Non-traumatic rhabdomyolysis   ? Obesity (BMI 30-39.9) 02/13/2012  ? Pain   ? Paroxysmal atrial fibrillation ([facility])   ? Pneumonia   ? Retroperitoneal bleed   ? Right ankle pain   ? Right knee pain   ? Severe aortic regurgitation 02/09/2016  ? Special screening for malignant neoplasms, colon 04/11/2013  ? Thyroid activity decreased   ? Tibial pain   ? Varicose vein of leg   ? right  ? Ventricular fibrillation ([place]) 02/09/2016  ? Weakness of both lower extremities   ? ? ?Past Surgical History:  ?Procedure Laterality Date  ? AORTIC VALVE REPLACEMENT  11/20/2018  ? CARDIAC CATHETERIZATION N/A 02/17/2016  ? Procedure: Left Heart Cath and Coronary Angiography;  Surgeon: Jettie Booze, MD;  Location: [facility] CV LAB;  Service: Cardiovascular;  Laterality: N/A;  ? CARDIOVERSION  03/22/2012  ? Procedure: CARDIOVERSION;  Surgeon: Candee Furbish, MD;  Location: [facility] ENDOSCOPY;  Service: Cardiovascular;  Laterality: N/A;  ? CARDIOVERSION  04/19/2012  ? Procedure: CARDIOVERSION;  Surgeon: Sinclair Grooms, MD;  Location: [facility];  Service: Cardiovascular;  Laterality: N/A;  ? COLONOSCOPY N/A 04/11/2013  ? Procedure: COLONOSCOPY;  Surgeon: Inda Castle, MD;  Location: [facility] ENDOSCOPY;  Service: Endoscopy;  Laterality: N/A;  ? COLONOSCOPY N/A 04/11/2013  ? Procedure: COLONOSCOPY;  Surgeon: Inda Castle, MD;  Location: [facility] ENDOSCOPY;  Service: Endoscopy;  Laterality: N/A;  ? EP IMPLANTABLE DEVICE N/A 02/17/2016  ? Procedure: BiV ICD Insertion CRT-D;  Surgeon: Evans Lance, MD;  Location: [facility] CV LAB;  Service: Cardiovascular;  Laterality: N/A;  ? FINGER SURGERY  2012  ? "4th digit right hand; thumb on left hand"  ? RADIOLOGY WITH ANESTHESIA N/A 02/11/2016  ? Procedure: MRI OF THE BRAIN WITHOUT CONTRAST, LUMBAR WITHOUT CONTRAST;  Surgeon: Medication Radiologist, MD;  Location: [facility];  Service: Radiology;  Laterality: N/A;  DR. WOOD/MRI  ? RIGHT/LEFT HEART CATH AND CORONARY  ANGIOGRAPHY N/A 07/26/2018  ? Procedure: RIGHT/LEFT HEART CATH AND CORONARY ANGIOGRAPHY;  Surgeon: Belva Crome, MD;  Location: [facility] CV LAB;  Service: Cardiovascular;  Laterality: N/A;  ? Skin melanocytoma excision  2012  ? "above left clavicle"  ? STERNAL INCISION RECLOSURE  11/2018  ? STERNAL WIRE REMOVAL  11/2018  ? STERNAL WOUND DEBRIDEMENT  11/2018  ? TEE WITHOUT CARDIOVERSION  03/22/2012  ? Procedure: TRANSESOPHAGEAL ECHOCARDIOGRAM (TEE);  Surgeon: Candee Furbish, MD;  Location: [facility] ENDOSCOPY;  Service: Cardiovascular;  Laterality: N/A;  ? TEE WITHOUT CARDIOVERSION N/A 02/08/2016  ? Procedure: TRANSESOPHAGEAL ECHOCARDIOGRAM (TEE);  Surgeon: Lelon Perla, MD;  Location: [facility];  Service: Cardiovascular;  Laterality: N/A;  ? ? ?Current Medications: ?Current Meds  ?Medication Sig  ? acetaminophen (TYLENOL) 500 MG tablet Take 1,000 mg by mouth every 4 (four) hours as needed for moderate pain or headache.  ? allopurinol (ZYLOPRIM) 100 MG tablet  Take 1 tab by mouth every other day  ? amiodarone (PACERONE) 100 MG tablet Take 1 tablet (100 mg total) by mouth daily.  ? apixaban (ELIQUIS) 5 MG TABS tablet Take 1 tablet (5 mg total) by mouth 2 (two) times daily.  ? carvedilol (COREG) 6.25 MG tablet TAKE 1 TABLET(6.25 MG) BY MOUTH TWICE DAILY  ? dapagliflozin propanediol (FARXIGA) 10 MG TABS tablet Take 1 tablet (10 mg total) by mouth daily before breakfast.  ? docusate sodium (COLACE) 100 MG capsule Take 100 mg by mouth daily as needed for mild constipation.  ? famciclovir (FAMVIR) 500 MG tablet Take 1 tablet (500 mg total) by mouth daily.  ? fluticasone (FLONASE) 50 MCG/ACT nasal spray Place 2 sprays into both nostrils daily.  ? furosemide (LASIX) 80 MG tablet TAKE 1 TABLET(80 MG) BY MOUTH DAILY  ? Hypromellose (ARTIFICIAL TEARS OP) Place 1 drop into both eyes daily as needed (dry eyes).  ? levothyroxine (SYNTHROID) 75 MCG tablet TAKE 1 TABLET BY MOUTH DAILY IN THE MORNING. EXCEPT TAKE 1/2 TABLET ON SUNDAYS  IN THE MORNING  ? loperamide (IMODIUM) 2 MG capsule Take 2 mg by mouth as needed for diarrhea or loose stools.  ? ondansetron (ZOFRAN) 8 MG tablet Take 1 tablet (8 mg total) by mouth 2 (two) times daily as need

## 2021-12-26 NOTE — Telephone Encounter (Signed)
Left detailed message letting pt know that I have scheduled him to be seen at the same time as his wife.  Advised on message to arrive 15 mins early so we can get both checked in in a timely manner.  ?

## 2021-12-27 ENCOUNTER — Ambulatory Visit (INDEPENDENT_AMBULATORY_CARE_PROVIDER_SITE_OTHER): Payer: Medicare Other | Admitting: Interventional Cardiology

## 2021-12-27 ENCOUNTER — Encounter: Payer: Self-pay | Admitting: Interventional Cardiology

## 2021-12-27 VITALS — BP 140/90 | HR 71 | Ht 69.0 in | Wt 208.2 lb

## 2021-12-27 DIAGNOSIS — I480 Paroxysmal atrial fibrillation: Secondary | ICD-10-CM | POA: Diagnosis not present

## 2021-12-27 DIAGNOSIS — I428 Other cardiomyopathies: Secondary | ICD-10-CM

## 2021-12-27 DIAGNOSIS — I4901 Ventricular fibrillation: Secondary | ICD-10-CM | POA: Diagnosis not present

## 2021-12-27 DIAGNOSIS — I10 Essential (primary) hypertension: Secondary | ICD-10-CM

## 2021-12-27 DIAGNOSIS — Z95810 Presence of automatic (implantable) cardiac defibrillator: Secondary | ICD-10-CM | POA: Diagnosis not present

## 2021-12-27 DIAGNOSIS — Z952 Presence of prosthetic heart valve: Secondary | ICD-10-CM

## 2021-12-27 DIAGNOSIS — I5022 Chronic systolic (congestive) heart failure: Secondary | ICD-10-CM

## 2021-12-27 MED ORDER — DAPAGLIFLOZIN PROPANEDIOL 10 MG PO TABS: 10.0000 mg | ORAL_TABLET | Freq: Every day | ORAL | 11 refills | Status: DC

## 2021-12-27 NOTE — Patient Instructions (Signed)
Medication Instructions:  ?1) RESTART your Bidil twice daily ?2) START Farxiga '10mg'$  once daily ? ?*If you need a refill on your cardiac medications before your next appointment, please call your pharmacy* ? ? ?Lab Work: ?BMET in 7-10 days ? ?If you have labs (blood work) drawn today and your tests are completely normal, you will receive your results only by: ?MyChart Message (if you have MyChart) OR ?A paper copy in the mail ?If you have any lab test that is abnormal or we need to change your treatment, we will call you to review the results. ? ? ?Testing/Procedures: ?None ? ? ?Follow-Up: ?At [facility], you and your health needs are our priority.  As part of our continuing mission to provide you with exceptional heart care, we have created designated Provider Care Teams.  These Care Teams include your primary Cardiologist (physician) and Advanced Practice Providers (APPs -  Physician Assistants and Nurse Practitioners) who all work together to provide you with the care you need, when you need it. ? ?We recommend signing up for the patient portal called "MyChart".  Sign up information is provided on this After Visit Summary.  MyChart is used to connect with patients for Virtual Visits (Telemedicine).  Patients are able to view lab/test results, encounter notes, upcoming appointments, etc.  Non-urgent messages can be sent to your provider as well.   ?To learn more about what you can do with MyChart, go to [email or web address].   ? ?Your next appointment:   ?6 month(s) ? ?The format for your next appointment:   ?In Person ? ?Provider:   ?Sinclair Grooms, MD  ? ? ?Other Instructions ?  ?

## 2021-12-28 NOTE — Progress Notes (Signed)
Remote ICD transmission.   

## 2022-01-02 ENCOUNTER — Other Ambulatory Visit: Payer: Self-pay | Admitting: Internal Medicine

## 2022-01-04 ENCOUNTER — Encounter: Payer: Self-pay | Admitting: Hematology & Oncology

## 2022-01-04 ENCOUNTER — Other Ambulatory Visit: Payer: Medicare Other | Admitting: *Deleted

## 2022-01-04 DIAGNOSIS — I5022 Chronic systolic (congestive) heart failure: Secondary | ICD-10-CM | POA: Diagnosis not present

## 2022-01-04 LAB — BASIC METABOLIC PANEL
BUN/Creatinine Ratio: 15 (ref 10–24)
BUN: 30 mg/dL — ABNORMAL HIGH (ref 8–27)
CO2: 22 mmol/L (ref 20–29)
Calcium: 8.6 mg/dL (ref 8.6–10.2)
Chloride: 105 mmol/L (ref 96–106)
Creatinine, Ser: 1.99 mg/dL — ABNORMAL HIGH (ref 0.76–1.27)
Glucose: 104 mg/dL — ABNORMAL HIGH (ref 70–99)
Potassium: 3.6 mmol/L (ref 3.5–5.2)
Sodium: 144 mmol/L (ref 134–144)
eGFR: 35 mL/min/{1.73_m2} — ABNORMAL LOW (ref >59)

## 2022-01-13 ENCOUNTER — Inpatient Hospital Stay (HOSPITAL_BASED_OUTPATIENT_CLINIC_OR_DEPARTMENT_OTHER): Payer: Medicare Other | Admitting: Hematology & Oncology

## 2022-01-13 ENCOUNTER — Other Ambulatory Visit: Payer: Self-pay

## 2022-01-13 ENCOUNTER — Encounter: Payer: Self-pay | Admitting: Hematology & Oncology

## 2022-01-13 ENCOUNTER — Inpatient Hospital Stay: Payer: Medicare Other | Attending: Family

## 2022-01-13 ENCOUNTER — Inpatient Hospital Stay: Payer: Medicare Other

## 2022-01-13 VITALS — BP 125/76 | HR 85 | Temp 97.6°F | Resp 16 | Wt 201.0 lb

## 2022-01-13 DIAGNOSIS — Z5111 Encounter for antineoplastic chemotherapy: Secondary | ICD-10-CM | POA: Diagnosis not present

## 2022-01-13 DIAGNOSIS — C9001 Multiple myeloma in remission: Secondary | ICD-10-CM

## 2022-01-13 DIAGNOSIS — M7989 Other specified soft tissue disorders: Secondary | ICD-10-CM | POA: Insufficient documentation

## 2022-01-13 DIAGNOSIS — Z5112 Encounter for antineoplastic immunotherapy: Secondary | ICD-10-CM | POA: Diagnosis not present

## 2022-01-13 DIAGNOSIS — Z79899 Other long term (current) drug therapy: Secondary | ICD-10-CM | POA: Diagnosis not present

## 2022-01-13 DIAGNOSIS — C9000 Multiple myeloma not having achieved remission: Secondary | ICD-10-CM

## 2022-01-13 LAB — CMP (CANCER CENTER ONLY)
ALT: 18 U/L (ref 0–44)
AST: 20 U/L (ref 15–41)
Albumin: 3.5 g/dL (ref 3.5–5.0)
Alkaline Phosphatase: 69 U/L (ref 38–126)
Anion gap: 9 (ref 5–15)
BUN: 24 mg/dL — ABNORMAL HIGH (ref 8–23)
CO2: 25 mmol/L (ref 22–32)
Calcium: 8.5 mg/dL — ABNORMAL LOW (ref 8.9–10.3)
Chloride: 108 mmol/L (ref 98–111)
Creatinine: 1.74 mg/dL — ABNORMAL HIGH (ref 0.61–1.24)
GFR, Estimated: 42 mL/min — ABNORMAL LOW (ref >60)
Glucose, Bld: 144 mg/dL — ABNORMAL HIGH (ref 70–99)
Potassium: 3.3 mmol/L — ABNORMAL LOW (ref 3.5–5.1)
Sodium: 142 mmol/L (ref 135–145)
Total Bilirubin: 0.7 mg/dL (ref 0.3–1.2)
Total Protein: 5.5 g/dL — ABNORMAL LOW (ref 6.5–8.1)

## 2022-01-13 LAB — RETICULOCYTES
Immature Retic Fract: 11.2 % (ref 2.3–15.9)
RBC.: 3.84 MIL/uL — ABNORMAL LOW (ref 4.22–5.81)
Retic Count, Absolute: 49.2 10*3/uL (ref 19.0–186.0)
Retic Ct Pct: 1.3 % (ref 0.4–3.1)

## 2022-01-13 LAB — CBC WITH DIFFERENTIAL (CANCER CENTER ONLY)
Abs Immature Granulocytes: 0.04 10*3/uL (ref 0.00–0.07)
Basophils Absolute: 0 10*3/uL (ref 0.0–0.1)
Basophils Relative: 1 %
Eosinophils Absolute: 0.1 10*3/uL (ref 0.0–0.5)
Eosinophils Relative: 3 %
HCT: 34.5 % — ABNORMAL LOW (ref 39.0–52.0)
Hemoglobin: 11.3 g/dL — ABNORMAL LOW (ref 13.0–17.0)
Immature Granulocytes: 1 %
Lymphocytes Relative: 12 %
Lymphs Abs: 0.6 10*3/uL — ABNORMAL LOW (ref 0.7–4.0)
MCH: 29.4 pg (ref 26.0–34.0)
MCHC: 32.8 g/dL (ref 30.0–36.0)
MCV: 89.8 fL (ref 80.0–100.0)
Monocytes Absolute: 0.9 10*3/uL (ref 0.1–1.0)
Monocytes Relative: 17 %
Neutro Abs: 3.3 10*3/uL (ref 1.7–7.7)
Neutrophils Relative %: 66 %
Platelet Count: 132 10*3/uL — ABNORMAL LOW (ref 150–400)
RBC: 3.84 MIL/uL — ABNORMAL LOW (ref 4.22–5.81)
RDW: 17.7 % — ABNORMAL HIGH (ref 11.5–15.5)
WBC Count: 4.9 10*3/uL (ref 4.0–10.5)
nRBC: 0 % (ref 0.0–0.2)

## 2022-01-13 LAB — IRON AND IRON BINDING CAPACITY (CC-WL,HP ONLY)
Iron: 37 ug/dL — ABNORMAL LOW (ref 45–182)
Saturation Ratios: 9 % — ABNORMAL LOW (ref 17.9–39.5)
TIBC: 431 ug/dL (ref 250–450)
UIBC: 394 ug/dL — ABNORMAL HIGH (ref 117–376)

## 2022-01-13 LAB — LACTATE DEHYDROGENASE: LDH: 217 U/L — ABNORMAL HIGH (ref 98–192)

## 2022-01-13 LAB — FERRITIN: Ferritin: 25 ng/mL (ref 24–336)

## 2022-01-13 MED ORDER — PROCHLORPERAZINE MALEATE 10 MG PO TABS: 10.0000 mg | ORAL_TABLET | Freq: Four times a day (QID) | ORAL | 1 refills | Status: DC | PRN

## 2022-01-13 MED ORDER — SODIUM CHLORIDE 0.9 % IV SOLN
20.0000 mg | Freq: Once | INTRAVENOUS | Status: AC
  Administered 2022-01-13: 20 mg via INTRAVENOUS
  Filled 2022-01-13: qty 20

## 2022-01-13 MED ORDER — PALONOSETRON HCL INJECTION 0.25 MG/5ML
0.2500 mg | Freq: Once | INTRAVENOUS | Status: AC
  Administered 2022-01-13: 0.25 mg via INTRAVENOUS
  Filled 2022-01-13: qty 5

## 2022-01-13 MED ORDER — BORTEZOMIB CHEMO SQ INJECTION 3.5 MG (2.5MG/ML)
1.3000 mg/m2 | Freq: Once | INTRAMUSCULAR | Status: AC
  Administered 2022-01-13: 3 mg via SUBCUTANEOUS
  Filled 2022-01-13: qty 1.2

## 2022-01-13 MED ORDER — ONDANSETRON HCL 8 MG PO TABS: 8.0000 mg | ORAL_TABLET | Freq: Two times a day (BID) | ORAL | 1 refills | Status: DC | PRN

## 2022-01-13 MED ORDER — SODIUM CHLORIDE 0.9 % IV SOLN
400.0000 mg/m2 | Freq: Once | INTRAVENOUS | Status: AC
  Administered 2022-01-13: 900 mg via INTRAVENOUS
  Filled 2022-01-13: qty 45

## 2022-01-13 MED ORDER — SODIUM CHLORIDE 0.9 % IV SOLN: Freq: Once | INTRAVENOUS | Status: AC

## 2022-01-13 NOTE — Patient Instructions (Signed)
[facility]  Discharge Instructions: ?Thank you for choosing [facility] to provide your oncology and hematology care.  ? ?If you have a lab appointment with the [facility], please go directly to the [facility] and check in at the registration area. ? ?Wear comfortable clothing and clothing appropriate for easy access to any Portacath or PICC line.  ? ?We strive to give you quality time with your provider. You may need to reschedule your appointment if you arrive late (15 or more minutes).  Arriving late affects you and other patients whose appointments are after yours.  Also, if you miss three or more appointments without notifying the office, you may be dismissed from the clinic at the provider?s discretion.    ?  ?For prescription refill requests, have your pharmacy contact our office and allow 72 hours for refills to be completed.   ? ?Today you received the following chemotherapy and/or immunotherapy agents Cytonan, Velcade ?    ?  ?To help prevent nausea and vomiting after your treatment, we encourage you to take your nausea medication as directed. ? ?BELOW ARE SYMPTOMS THAT SHOULD BE REPORTED IMMEDIATELY: ?*FEVER GREATER THAN 100.4 F (38 ?C) OR HIGHER ?*CHILLS OR SWEATING ?*NAUSEA AND VOMITING THAT IS NOT CONTROLLED WITH YOUR NAUSEA MEDICATION ?*UNUSUAL SHORTNESS OF BREATH ?*UNUSUAL BRUISING OR BLEEDING ?*URINARY PROBLEMS (pain or burning when urinating, or frequent urination) ?*BOWEL PROBLEMS (unusual diarrhea, constipation, pain near the anus) ?TENDERNESS IN MOUTH AND THROAT WITH OR WITHOUT PRESENCE OF ULCERS (sore throat, sores in mouth, or a toothache) ?UNUSUAL RASH, SWELLING OR PAIN  ?UNUSUAL VAGINAL DISCHARGE OR ITCHING  ? ?Items with * indicate a potential emergency and should be followed up as soon as possible or go to the Emergency Department if any problems should occur. ? ?Please show the CHEMOTHERAPY ALERT CARD or IMMUNOTHERAPY ALERT CARD at check-in  to the Emergency Department and triage nurse. ?Should you have questions after your visit or need to cancel or reschedule your appointment, please contact [facility]  [phone] and follow the prompts.  Office hours are 8:00 a.m. to 4:30 p.m. Monday - Friday. Please note that voicemails left after 4:00 p.m. may not be returned until the following business day.  We are closed weekends and major holidays. You have access to a nurse at all times for urgent questions. Please call the main number to the clinic [phone] and follow the prompts. ? ?For any non-urgent questions, you may also contact your provider using MyChart. We now offer e-Visits for anyone 38 and older to request care online for non-urgent symptoms. For details visit [email or web address]. ?  ?Also download the MyChart app! Go to the app store, search "MyChart", open the app, select [facility], and log in with your MyChart username and password. ? ?Due to Covid, a mask is required upon entering the hospital/clinic. If you do not have a mask, one will be given to you upon arrival. For doctor visits, patients may have 1 support person aged 82 or older with them. For treatment visits, patients cannot have anyone with them due to current Covid guidelines and our immunocompromised population.  ?

## 2022-01-13 NOTE — Progress Notes (Signed)
Okay to treat with SCr 1.77 per Dr. Marin Olp. ?Will increase the treatment condition to contact for SCr > 2 per his instructions. ?

## 2022-01-13 NOTE — Progress Notes (Signed)
?Hematology and Oncology Follow Up Visit ? ?Eric [name]., MD ?[id number] ?07/27/1951 71 y.o. ?01/13/2022 ? ? ?Principle Diagnosis:  ?IgG kappa myeloma  - 1q+ ?  ?Current Therapy:        ?CyBorD -- s/p cycle 13 - started on 06/04/2020 ?  ?Interim History:  Eric [name] is here today for follow-up and treatment.  So far, he has done incredibly well.  He really has had no specific complaints.  He is still working part-time as an Chief Strategy Officer. ? ?He is responded so nicely to treatment.  We are only doing treatment once a month.  He would like to try to bring that out a little bit further if possible.  We could certainly look into this. ? ?His last myeloma studies that were done back in March showed no monoclonal spike in his blood.  His IgG level was 470 mg/dL.  The Kappa light chain was 1.8 mg/dL. ? ?He has had no problems with cough or shortness of breath. ? ?He is now on [place].  His cardiologist put him on that.  He thinks it is helped him with respect to swelling in the legs.  His legs do not look nearly as swollen.  He has lost a little bit of weight. ? ?His wife still having some difficulty.  I think she is on dialysis.   ? ?He has had no problem with rashes. ? ?He does have some high blood sugars but again these seem to be under fairly good control. ? ?Overall, I would have to say that his performance status right now is probably ECOG 1.   ? ?Medications:  ?Allergies as of 01/13/2022   ? ?   Reactions  ? Lactose Intolerance (gi) Diarrhea  ? ?  ? ?  ?Medication List  ?  ? ?  ? Accurate as of January 13, 2022  1:05 PM. If you have any questions, ask your nurse or doctor.  ?  ?  ? ?  ? ?STOP taking these medications   ? ?benzonatate 100 MG capsule ?Commonly known as: Best boy ?Stopped by: Volanda Napoleon, MD ?  ?docusate sodium 100 MG capsule ?Commonly known as: COLACE ?Stopped by: Volanda Napoleon, MD ?  ?ipratropium 0.06 % nasal spray ?Commonly known as: ATROVENT ?Stopped by: Volanda Napoleon, MD ?  ? ?   ? ?TAKE these medications   ? ?acetaminophen 500 MG tablet ?Commonly known as: TYLENOL ?Take 1,000 mg by mouth every 4 (four) hours as needed for moderate pain or headache. ?  ?allopurinol 100 MG tablet ?Commonly known as: ZYLOPRIM ?Take 1 tab by mouth every other day ?  ?amiodarone 100 MG tablet ?Commonly known as: PACERONE ?Take 1 tablet (100 mg total) by mouth daily. ?  ?apixaban 5 MG Tabs tablet ?Commonly known as: Eliquis ?Take 1 tablet (5 mg total) by mouth 2 (two) times daily. ?  ?ARTIFICIAL TEARS OP ?Place 1 drop into both eyes daily as needed (dry eyes). ?  ?carvedilol 6.25 MG tablet ?Commonly known as: COREG ?TAKE 1 TABLET(6.25 MG) BY MOUTH TWICE DAILY ?  ?dapagliflozin propanediol 10 MG Tabs tablet ?Commonly known as: [place] ?Take 1 tablet (10 mg total) by mouth daily before breakfast. ?  ?famciclovir 500 MG tablet ?Commonly known as: FAMVIR ?Take 1 tablet (500 mg total) by mouth daily. ?  ?fluticasone 50 MCG/ACT nasal spray ?Commonly known as: FLONASE ?Place 2 sprays into both nostrils daily. ?  ?furosemide 80 MG tablet ?Commonly known as: LASIX ?TAKE 1  TABLET(80 MG) BY MOUTH DAILY ?  ?isosorbide-hydrALAZINE 20-37.5 MG tablet ?Commonly known as: BIDIL ?TAKE 1 TABLET BY MOUTH THREE TIMES DAILY ?  ?levothyroxine 75 MCG tablet ?Commonly known as: SYNTHROID ?TAKE 1 TABLET BY MOUTH DAILY IN THE MORNING. EXCEPT TAKE 1/2 TABLET ON SUNDAYS IN THE MORNING ?  ?loperamide 2 MG capsule ?Commonly known as: IMODIUM ?Take 2 mg by mouth as needed for diarrhea or loose stools. ?  ?ondansetron 8 MG tablet ?Commonly known as: Zofran ?Take 1 tablet (8 mg total) by mouth 2 (two) times daily as needed for refractory nausea / vomiting. Start on day 3 after Cytoxan. ?  ?potassium chloride 20 MEQ/15ML (10%) Soln ?TAKE 15 ML BY MOUTH TWICE DAILY ?  ?prochlorperazine 10 MG tablet ?Commonly known as: COMPAZINE ?Take 1 tablet (10 mg total) by mouth every 6 (six) hours as needed (Nausea or vomiting). ?  ?rosuvastatin 20 MG  tablet ?Commonly known as: CRESTOR ?Take 1 tablet (20 mg total) by mouth daily. ?  ?vitamin B-12 100 MCG tablet ?Commonly known as: CYANOCOBALAMIN ?Take 100 mcg by mouth daily. ?  ?vitamin C 500 MG tablet ?Commonly known as: ASCORBIC ACID ?Take 500 mg by mouth daily. ?  ?VITAMIN D PO ?Take 1 capsule by mouth daily. ?  ? ?  ? ? ?Allergies:  ?Allergies  ?Allergen Reactions  ? Lactose Intolerance (Gi) Diarrhea  ? ? ?Past Medical History, Surgical history, Social history, and Family History were reviewed and updated. ? ?Review of Systems: ?Review of Systems  ?Constitutional: Negative.   ?Eyes: Negative.   ?Respiratory: Negative.    ?Cardiovascular:  Positive for leg swelling.  ?Gastrointestinal: Negative.   ?Genitourinary: Negative.   ?Musculoskeletal: Negative.   ?Skin: Negative.   ?Neurological: Negative.   ?Endo/Heme/Allergies: Negative.   ?Psychiatric/Behavioral: Negative.    ? ? ?Physical Exam: ? weight is 201 lb (91.2 kg). His oral temperature is 97.6 ?F (36.4 ?C). His blood pressure is 125/76 and his pulse is 85. His respiration is 16 and oxygen saturation is 98%.  ? ?Wt Readings from Last 3 Encounters:  ?01/13/22 201 lb (91.2 kg)  ?12/27/21 208 lb 4 oz (94.5 kg)  ?12/16/21 211 lb (95.7 kg)  ? ?His vital signs show temperature of 98.2.  Pulse 61.  Blood pressure 126/88.  Weight is 205 pounds. ? ?Physical Exam ?Vitals reviewed.  ?HENT:  ?   Head: Normocephalic and atraumatic.  ?Eyes:  ?   Pupils: Pupils are equal, round, and reactive to light.  ?Cardiovascular:  ?   Rate and Rhythm: Normal rate and regular rhythm.  ?   Heart sounds: Normal heart sounds.  ?Pulmonary:  ?   Effort: Pulmonary effort is normal.  ?   Breath sounds: Normal breath sounds.  ?Abdominal:  ?   General: Bowel sounds are normal.  ?   Palpations: Abdomen is soft.  ?Musculoskeletal:     ?   General: No tenderness or deformity. Normal range of motion.  ?   Cervical back: Normal range of motion.  ?Lymphadenopathy:  ?   Cervical: No cervical  adenopathy.  ?Skin: ?   General: Skin is warm and dry.  ?   Findings: No erythema or rash.  ?Neurological:  ?   Mental Status: He is alert and oriented to person, place, and time.  ?Psychiatric:     ?   Behavior: Behavior normal.     ?   Thought Content: Thought content normal.     ?   Judgment: Judgment normal.  ? ? ? ? ?  Lab Results  ?Component Value Date  ? WBC 4.9 01/13/2022  ? HGB 11.3 (L) 01/13/2022  ? HCT 34.5 (L) 01/13/2022  ? MCV 89.8 01/13/2022  ? PLT 132 (L) 01/13/2022  ? ?Lab Results  ?Component Value Date  ? FERRITIN 25 01/13/2022  ? IRON 37 (L) 01/13/2022  ? TIBC 431 01/13/2022  ? UIBC 394 (H) 01/13/2022  ? IRONPCTSAT 9 (L) 01/13/2022  ? ?Lab Results  ?Component Value Date  ? RETICCTPCT 1.3 01/13/2022  ? RBC 3.84 (L) 01/13/2022  ? ?Lab Results  ?Component Value Date  ? KPAFRELGTCHN 19.7 (H) 12/16/2021  ? LAMBDASER 17.8 12/16/2021  ? KAPLAMBRATIO 1.11 12/16/2021  ? ?Lab Results  ?Component Value Date  ? IGGSERUM 470 (L) 12/16/2021  ? IGA 87 12/16/2021  ? IGMSERUM 11 (L) 12/16/2021  ? ?Lab Results  ?Component Value Date  ? TOTALPROTELP 5.3 (L) 12/16/2021  ? ALBUMINELP 3.0 12/16/2021  ? A1GS 0.3 12/16/2021  ? A2GS 0.7 12/16/2021  ? BETS 0.9 12/16/2021  ? GAMS 0.4 12/16/2021  ? MSPIKE Not Observed 12/16/2021  ? SPEI Comment 04/22/2021  ? ?  Chemistry   ?   ?Component Value Date/Time  ? NA 142 01/13/2022 0814  ? NA 144 01/04/2022 1143  ? K 3.3 (L) 01/13/2022 2122  ? CL 108 01/13/2022 0814  ? CO2 25 01/13/2022 0814  ? BUN 24 (H) 01/13/2022 4825  ? BUN 30 (H) 01/04/2022 1143  ? CREATININE 1.74 (H) 01/13/2022 0037  ? CREATININE 1.95 (H) 09/08/2016 0859  ?    ?Component Value Date/Time  ? CALCIUM 8.5 (L) 01/13/2022 0488  ? ALKPHOS 69 01/13/2022 0814  ? AST 20 01/13/2022 0814  ? ALT 18 01/13/2022 0814  ? BILITOT 0.7 01/13/2022 0814  ?  ? ? ? ?Impression and Plan: Eric [name] is a very nice 71 yo African American gentleman with IgG kappa myeloma.  He has done incredibly well with treatment.  Again, we will try to  move his appointments out a little bit longer.  We will see about moving him out to 5 weeks. ? ?We certainly have quite a few options if we have to and if his myeloma does recurrent. ? ?I am happy that he is able to

## 2022-01-13 NOTE — Progress Notes (Signed)
Order received per Dr. Marin Olp for pt to get one dose of IV iron, but pt refuses iron infusion.  Dr. Marin Olp notified.  ? ? ?

## 2022-01-14 LAB — IGG, IGA, IGM
IgA: 89 mg/dL (ref 61–437)
IgG (Immunoglobin G), Serum: 498 mg/dL — ABNORMAL LOW (ref 603–1613)
IgM (Immunoglobulin M), Srm: 10 mg/dL — ABNORMAL LOW (ref 20–172)

## 2022-01-16 ENCOUNTER — Ambulatory Visit (INDEPENDENT_AMBULATORY_CARE_PROVIDER_SITE_OTHER): Payer: Medicare Other | Admitting: Podiatry

## 2022-01-16 ENCOUNTER — Encounter: Payer: Self-pay | Admitting: Podiatry

## 2022-01-16 DIAGNOSIS — M79674 Pain in right toe(s): Secondary | ICD-10-CM

## 2022-01-16 DIAGNOSIS — E119 Type 2 diabetes mellitus without complications: Secondary | ICD-10-CM

## 2022-01-16 DIAGNOSIS — M79675 Pain in left toe(s): Secondary | ICD-10-CM

## 2022-01-16 DIAGNOSIS — L608 Other nail disorders: Secondary | ICD-10-CM

## 2022-01-16 DIAGNOSIS — B351 Tinea unguium: Secondary | ICD-10-CM

## 2022-01-16 DIAGNOSIS — D689 Coagulation defect, unspecified: Secondary | ICD-10-CM

## 2022-01-16 LAB — KAPPA/LAMBDA LIGHT CHAINS
Kappa free light chain: 27.3 mg/L — ABNORMAL HIGH (ref 3.3–19.4)
Kappa, lambda light chain ratio: 1.72 — ABNORMAL HIGH (ref 0.26–1.65)
Lambda free light chains: 15.9 mg/L (ref 5.7–26.3)

## 2022-01-16 NOTE — Progress Notes (Signed)
This patient returns to my office for at risk foot care.  This patient requires this care by a professional since this patient will be at risk due to having diabetes type 2, chronic kidney disease and coagulation defect.  Patient is taking eliquisThis patient is unable to cut nails himself since the patient cannot reach his nails.These nails are painful walking and wearing shoes.  This patient presents for at risk foot care today.  General Appearance  Alert, conversant and in no acute stress.  Vascular  Dorsalis pedis and posterior tibial  pulses are palpable  bilaterally.  Capillary return is within normal limits  bilaterally. Temperature is within normal limits  bilaterally.  Neurologic  Senn-Weinstein monofilament wire test within normal limits  bilaterally. Muscle power within normal limits bilaterally.  Nails Thick disfigured discolored nails with subungual debris  from hallux to fifth toes bilaterally. No evidence of bacterial infection or drainage bilaterally.  Orthopedic  No limitations of motion  feet .  No crepitus or effusions noted.  No bony pathology or digital deformities noted.  Skin  normotropic skin with no porokeratosis noted bilaterally.  No signs of infections or ulcers noted.     Onychomycosis  Pain in right toes  Pain in left toes  Consent was obtained for treatment procedures.   Mechanical debridement of nails 1-5  bilaterally performed with a nail nipper.  Filed with dremel without incident.    Return office visit   10 weeks                   Told patient to return for periodic foot care and evaluation due to potential at risk complications.   Kahlie Deutscher DPM  

## 2022-01-25 ENCOUNTER — Other Ambulatory Visit: Payer: Self-pay

## 2022-01-25 MED ORDER — AMIODARONE HCL 100 MG PO TABS: 100.0000 mg | ORAL_TABLET | Freq: Every day | ORAL | 0 refills | Status: DC

## 2022-02-11 ENCOUNTER — Other Ambulatory Visit: Payer: Self-pay | Admitting: Internal Medicine

## 2022-02-17 ENCOUNTER — Encounter: Payer: Self-pay | Admitting: Hematology & Oncology

## 2022-02-17 ENCOUNTER — Inpatient Hospital Stay: Payer: Medicare Other

## 2022-02-17 ENCOUNTER — Inpatient Hospital Stay (HOSPITAL_BASED_OUTPATIENT_CLINIC_OR_DEPARTMENT_OTHER): Payer: Medicare Other | Admitting: Hematology & Oncology

## 2022-02-17 ENCOUNTER — Inpatient Hospital Stay: Payer: Medicare Other | Attending: Family

## 2022-02-17 VITALS — BP 132/81 | HR 70 | Temp 98.4°F | Resp 20

## 2022-02-17 DIAGNOSIS — Z79899 Other long term (current) drug therapy: Secondary | ICD-10-CM | POA: Diagnosis not present

## 2022-02-17 DIAGNOSIS — M7989 Other specified soft tissue disorders: Secondary | ICD-10-CM | POA: Diagnosis not present

## 2022-02-17 DIAGNOSIS — C9002 Multiple myeloma in relapse: Secondary | ICD-10-CM

## 2022-02-17 DIAGNOSIS — C9001 Multiple myeloma in remission: Secondary | ICD-10-CM

## 2022-02-17 DIAGNOSIS — Z5112 Encounter for antineoplastic immunotherapy: Secondary | ICD-10-CM | POA: Insufficient documentation

## 2022-02-17 DIAGNOSIS — C9000 Multiple myeloma not having achieved remission: Secondary | ICD-10-CM | POA: Diagnosis not present

## 2022-02-17 DIAGNOSIS — Z5111 Encounter for antineoplastic chemotherapy: Secondary | ICD-10-CM | POA: Insufficient documentation

## 2022-02-17 LAB — CMP (CANCER CENTER ONLY)
ALT: 16 U/L (ref 0–44)
AST: 20 U/L (ref 15–41)
Albumin: 3.5 g/dL (ref 3.5–5.0)
Alkaline Phosphatase: 78 U/L (ref 38–126)
Anion gap: 7 (ref 5–15)
BUN: 25 mg/dL — ABNORMAL HIGH (ref 8–23)
CO2: 25 mmol/L (ref 22–32)
Calcium: 8.6 mg/dL — ABNORMAL LOW (ref 8.9–10.3)
Chloride: 107 mmol/L (ref 98–111)
Creatinine: 1.8 mg/dL — ABNORMAL HIGH (ref 0.61–1.24)
GFR, Estimated: 40 mL/min — ABNORMAL LOW (ref >60)
Glucose, Bld: 136 mg/dL — ABNORMAL HIGH (ref 70–99)
Potassium: 3.8 mmol/L (ref 3.5–5.1)
Sodium: 139 mmol/L (ref 135–145)
Total Bilirubin: 0.8 mg/dL (ref 0.3–1.2)
Total Protein: 5.6 g/dL — ABNORMAL LOW (ref 6.5–8.1)

## 2022-02-17 LAB — CBC WITH DIFFERENTIAL (CANCER CENTER ONLY)
Abs Immature Granulocytes: 0.02 10*3/uL (ref 0.00–0.07)
Basophils Absolute: 0 10*3/uL (ref 0.0–0.1)
Basophils Relative: 0 %
Eosinophils Absolute: 0.2 10*3/uL (ref 0.0–0.5)
Eosinophils Relative: 4 %
HCT: 33.6 % — ABNORMAL LOW (ref 39.0–52.0)
Hemoglobin: 11.1 g/dL — ABNORMAL LOW (ref 13.0–17.0)
Immature Granulocytes: 0 %
Lymphocytes Relative: 8 %
Lymphs Abs: 0.5 10*3/uL — ABNORMAL LOW (ref 0.7–4.0)
MCH: 29.2 pg (ref 26.0–34.0)
MCHC: 33 g/dL (ref 30.0–36.0)
MCV: 88.4 fL (ref 80.0–100.0)
Monocytes Absolute: 1 10*3/uL (ref 0.1–1.0)
Monocytes Relative: 17 %
Neutro Abs: 4.1 10*3/uL (ref 1.7–7.7)
Neutrophils Relative %: 71 %
Platelet Count: 147 10*3/uL — ABNORMAL LOW (ref 150–400)
RBC: 3.8 MIL/uL — ABNORMAL LOW (ref 4.22–5.81)
RDW: 17.2 % — ABNORMAL HIGH (ref 11.5–15.5)
WBC Count: 5.8 10*3/uL (ref 4.0–10.5)
nRBC: 0 % (ref 0.0–0.2)

## 2022-02-17 LAB — LACTATE DEHYDROGENASE: LDH: 210 U/L — ABNORMAL HIGH (ref 98–192)

## 2022-02-17 MED ORDER — PALONOSETRON HCL INJECTION 0.25 MG/5ML
0.2500 mg | Freq: Once | INTRAVENOUS | Status: AC
  Administered 2022-02-17: 0.25 mg via INTRAVENOUS
  Filled 2022-02-17: qty 5

## 2022-02-17 MED ORDER — SODIUM CHLORIDE 0.9 % IV SOLN
400.0000 mg/m2 | Freq: Once | INTRAVENOUS | Status: AC
  Administered 2022-02-17: 900 mg via INTRAVENOUS
  Filled 2022-02-17: qty 45

## 2022-02-17 MED ORDER — SODIUM CHLORIDE 0.9 % IV SOLN
20.0000 mg | Freq: Once | INTRAVENOUS | Status: AC
  Administered 2022-02-17: 20 mg via INTRAVENOUS
  Filled 2022-02-17: qty 20

## 2022-02-17 MED ORDER — BORTEZOMIB CHEMO SQ INJECTION 3.5 MG (2.5MG/ML)
1.3000 mg/m2 | Freq: Once | INTRAMUSCULAR | Status: AC
  Administered 2022-02-17: 3 mg via SUBCUTANEOUS
  Filled 2022-02-17: qty 1.2

## 2022-02-17 MED ORDER — SODIUM CHLORIDE 0.9 % IV SOLN: Freq: Once | INTRAVENOUS | Status: AC

## 2022-02-17 NOTE — Progress Notes (Signed)
Hematology and Oncology Follow Up Visit  Eric [name]., MD [id number] [birth year] 71 y.o. 02/17/2022   Principle Diagnosis:  IgG kappa myeloma  - 1q+   Current Therapy:        CyBorD -- s/p cycle 14 - started on 06/04/2020   Interim History:  [name] is here today for follow-up and treatment.  So far, he has done incredibly well.  He really has had no specific complaints.  He is still working part-time as an Chief Strategy Officer.  I noticed that his Kappa light chain has been slowly going up.  We last saw him back in April, the Kappa light chain was 2.7 mg/dL.  Prior to this, it was 2.0 mg/dL.  There is been no monoclonal spike in his blood.  His last IgG level was 498 mg/dL.  This also is going up a little bit.  He has had no problems with pain.  There is been no change in bowel or bladder habits.  He has had no nausea or vomiting.  He has had limited leg swelling.  He has been followed by cardiology very closely.  He has had no cough.  There is no shortness of breath.  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    Medications:  Allergies as of 02/17/2022       Reactions   Lactose Intolerance (gi) Diarrhea        Medication List        Accurate as of Feb 17, 2022  2:38 PM. If you have any questions, ask your nurse or doctor.          acetaminophen 500 MG tablet Commonly known as: TYLENOL Take 1,000 mg by mouth every 4 (four) hours as needed for moderate pain or headache.   allopurinol 100 MG tablet Commonly known as: ZYLOPRIM Take 1 tab by mouth every other day   amiodarone 100 MG tablet Commonly known as: PACERONE TAKE 1 TABLET(100 MG) BY MOUTH DAILY   apixaban 5 MG Tabs tablet Commonly known as: Eliquis Take 1 tablet (5 mg total) by mouth 2 (two) times daily.   ARTIFICIAL TEARS OP Place 1 drop into both eyes daily as needed (dry eyes).   carvedilol 6.25 MG tablet Commonly known as: COREG TAKE 1 TABLET(6.25 MG) BY MOUTH TWICE DAILY    dapagliflozin propanediol 10 MG Tabs tablet Commonly known as: Farxiga Take 1 tablet (10 mg total) by mouth daily before breakfast.   famciclovir 500 MG tablet Commonly known as: FAMVIR Take 1 tablet (500 mg total) by mouth daily.   fluticasone 50 MCG/ACT nasal spray Commonly known as: FLONASE Place 2 sprays into both nostrils daily.   furosemide 80 MG tablet Commonly known as: LASIX TAKE 1 TABLET(80 MG) BY MOUTH DAILY   isosorbide-hydrALAZINE 20-37.5 MG tablet Commonly known as: BIDIL TAKE 1 TABLET BY MOUTH THREE TIMES DAILY   levothyroxine 75 MCG tablet Commonly known as: SYNTHROID TAKE 1 TABLET BY MOUTH DAILY IN THE MORNING. EXCEPT TAKE 1/2 TABLET ON SUNDAYS IN THE MORNING   loperamide 2 MG capsule Commonly known as: IMODIUM Take 2 mg by mouth as needed for diarrhea or loose stools.   ondansetron 8 MG tablet Commonly known as: Zofran Take 1 tablet (8 mg total) by mouth 2 (two) times daily as needed for refractory nausea / vomiting. Start on day 3 after Cytoxan.   potassium chloride 20 MEQ/15ML (10%) Soln TAKE 15 ML BY MOUTH TWICE DAILY   prochlorperazine 10 MG tablet Commonly known as:  COMPAZINE Take 1 tablet (10 mg total) by mouth every 6 (six) hours as needed (Nausea or vomiting).   rosuvastatin 20 MG tablet Commonly known as: CRESTOR Take 1 tablet (20 mg total) by mouth daily.   vitamin B-12 100 MCG tablet Commonly known as: CYANOCOBALAMIN Take 100 mcg by mouth daily.   vitamin C 500 MG tablet Commonly known as: ASCORBIC ACID Take 500 mg by mouth daily.   VITAMIN D PO Take 1 capsule by mouth daily.        Allergies:  Allergies  Allergen Reactions   Lactose Intolerance (Gi) Diarrhea    Past Medical History, Surgical history, Social history, and Family History were reviewed and updated.  Review of Systems: Review of Systems  Constitutional: Negative.   Eyes: Negative.   Respiratory: Negative.    Cardiovascular:  Positive for leg swelling.   Gastrointestinal: Negative.   Genitourinary: Negative.   Musculoskeletal: Negative.   Skin: Negative.   Neurological: Negative.   Endo/Heme/Allergies: Negative.   Psychiatric/Behavioral: Negative.      Physical Exam:  oral temperature is 98.4 F (36.9 C). His blood pressure is 132/81 and his pulse is 70. His respiration is 20 and oxygen saturation is 99%.   Wt Readings from Last 3 Encounters:  01/13/22 201 lb (91.2 kg)  12/27/21 208 lb 4 oz (94.5 kg)  12/16/21 211 lb (95.7 kg)   His vital signs show temperature of 98.2.  Pulse 61.  Blood pressure 126/88.  Weight is 205 pounds.  Physical Exam Vitals reviewed.  HENT:     Head: Normocephalic and atraumatic.  Eyes:     Pupils: Pupils are equal, round, and reactive to light.  Cardiovascular:     Rate and Rhythm: Normal rate and regular rhythm.     Heart sounds: Normal heart sounds.  Pulmonary:     Effort: Pulmonary effort is normal.     Breath sounds: Normal breath sounds.  Abdominal:     General: Bowel sounds are normal.     Palpations: Abdomen is soft.  Musculoskeletal:        General: No tenderness or deformity. Normal range of motion.     Cervical back: Normal range of motion.  Lymphadenopathy:     Cervical: No cervical adenopathy.  Skin:    General: Skin is warm and dry.     Findings: No erythema or rash.  Neurological:     Mental Status: He is alert and oriented to person, place, and time.  Psychiatric:        Behavior: Behavior normal.        Thought Content: Thought content normal.        Judgment: Judgment normal.      Lab Results  Component Value Date   WBC 5.8 02/17/2022   HGB 11.1 (L) 02/17/2022   HCT 33.6 (L) 02/17/2022   MCV 88.4 02/17/2022   PLT 147 (L) 02/17/2022   Lab Results  Component Value Date   FERRITIN 25 01/13/2022   IRON 37 (L) 01/13/2022   TIBC 431 01/13/2022   UIBC 394 (H) 01/13/2022   IRONPCTSAT 9 (L) 01/13/2022   Lab Results  Component Value Date   RETICCTPCT 1.3  01/13/2022   RBC 3.80 (L) 02/17/2022   Lab Results  Component Value Date   KPAFRELGTCHN 27.3 (H) 01/13/2022   LAMBDASER 15.9 01/13/2022   KAPLAMBRATIO 1.72 (H) 01/13/2022   Lab Results  Component Value Date   IGGSERUM 498 (L) 01/13/2022   IGA 89 01/13/2022  IGMSERUM 10 (L) 01/13/2022   Lab Results  Component Value Date   TOTALPROTELP 5.3 (L) 12/16/2021   ALBUMINELP 3.0 12/16/2021   A1GS 0.3 12/16/2021   A2GS 0.7 12/16/2021   BETS 0.9 12/16/2021   GAMS 0.4 12/16/2021   MSPIKE Not Observed 12/16/2021   SPEI Comment 04/22/2021     Chemistry      Component Value Date/Time   NA 139 02/17/2022 0844   NA 144 01/04/2022 1143   K 3.8 02/17/2022 0844   CL 107 02/17/2022 0844   CO2 25 02/17/2022 0844   BUN 25 (H) 02/17/2022 0844   BUN 30 (H) 01/04/2022 1143   CREATININE 1.80 (H) 02/17/2022 0844   CREATININE 1.95 (H) 09/08/2016 0859      Component Value Date/Time   CALCIUM 8.6 (L) 02/17/2022 0844   ALKPHOS 78 02/17/2022 0844   AST 20 02/17/2022 0844   ALT 16 02/17/2022 0844   BILITOT 0.8 02/17/2022 0844       Impression and Plan: Mr. Klinke is a very nice 71 yo African American gentleman with IgG kappa myeloma.  He has done incredibly well with treatment.  We will have to see what his Kappa light chain is this time.  Again, if it is higher, we may have to make an adjustment with his protocol.  We are trying to move his treatments out as much as possible.  However, if we find that his levels are going up, we will need to make a change.  If we might have to add something to his treatment.  We might need to think about Faspro.  Again, quality of life is what is important for [name].  I would like to see him back in another month or so.  Again we we will get him back sooner if necessary.    Volanda Napoleon, MD 5/26/20232:38 PM

## 2022-02-17 NOTE — Patient Instructions (Signed)
[facility]  Discharge Instructions: Thank you for choosing [facility] to provide your oncology and hematology care.   If you have a lab appointment with the [facility], please go directly to the [facility] and check in at the registration area.  Wear comfortable clothing and clothing appropriate for easy access to any Portacath or PICC line.   We strive to give you quality time with your provider. You may need to reschedule your appointment if you arrive late (15 or more minutes).  Arriving late affects you and other patients whose appointments are after yours.  Also, if you miss three or more appointments without notifying the office, you may be dismissed from the clinic at the provider's discretion.      For prescription refill requests, have your pharmacy contact our office and allow 72 hours for refills to be completed.    Today you received the following chemotherapy and/or immunotherapy agents Cytoxan, Velcade.      To help prevent nausea and vomiting after your treatment, we encourage you to take your nausea medication as directed.  BELOW ARE SYMPTOMS THAT SHOULD BE REPORTED IMMEDIATELY: *FEVER GREATER THAN 100.4 F (38 C) OR HIGHER *CHILLS OR SWEATING *NAUSEA AND VOMITING THAT IS NOT CONTROLLED WITH YOUR NAUSEA MEDICATION *UNUSUAL SHORTNESS OF BREATH *UNUSUAL BRUISING OR BLEEDING *URINARY PROBLEMS (pain or burning when urinating, or frequent urination) *BOWEL PROBLEMS (unusual diarrhea, constipation, pain near the anus) TENDERNESS IN MOUTH AND THROAT WITH OR WITHOUT PRESENCE OF ULCERS (sore throat, sores in mouth, or a toothache) UNUSUAL RASH, SWELLING OR PAIN  UNUSUAL VAGINAL DISCHARGE OR ITCHING   Items with * indicate a potential emergency and should be followed up as soon as possible or go to the Emergency Department if any problems should occur.  Please show the CHEMOTHERAPY ALERT CARD or IMMUNOTHERAPY ALERT CARD at check-in  to the Emergency Department and triage nurse. Should you have questions after your visit or need to cancel or reschedule your appointment, please contact [facility]  [phone] and follow the prompts.  Office hours are 8:00 a.m. to 4:30 p.m. Monday - Friday. Please note that voicemails left after 4:00 p.m. may not be returned until the following business day.  We are closed weekends and major holidays. You have access to a nurse at all times for urgent questions. Please call the main number to the clinic [phone] and follow the prompts.  For any non-urgent questions, you may also contact your provider using MyChart. We now offer e-Visits for anyone 60 and older to request care online for non-urgent symptoms. For details visit [email or web address].   Also download the MyChart app! Go to the app store, search "MyChart", open the app, select [facility], and log in with your MyChart username and password.  Due to Covid, a mask is required upon entering the hospital/clinic. If you do not have a mask, one will be given to you upon arrival. For doctor visits, patients may have 1 support person aged 7 or older with them. For treatment visits, patients cannot have anyone with them due to current Covid guidelines and our immunocompromised population.

## 2022-02-20 LAB — IGG, IGA, IGM
IgA: 89 mg/dL (ref 61–437)
IgG (Immunoglobin G), Serum: 516 mg/dL — ABNORMAL LOW (ref 603–1613)
IgM (Immunoglobulin M), Srm: 9 mg/dL — ABNORMAL LOW (ref 20–172)

## 2022-02-21 ENCOUNTER — Telehealth: Payer: Self-pay | Admitting: Hematology & Oncology

## 2022-02-21 LAB — PROTEIN ELECTROPHORESIS, SERUM, WITH REFLEX
A/G Ratio: 1.3 (ref 0.7–1.7)
Albumin ELP: 2.9 g/dL (ref 2.9–4.4)
Alpha-1-Globulin: 0.3 g/dL (ref 0.0–0.4)
Alpha-2-Globulin: 0.7 g/dL (ref 0.4–1.0)
Beta Globulin: 0.8 g/dL (ref 0.7–1.3)
Gamma Globulin: 0.4 g/dL (ref 0.4–1.8)
Globulin, Total: 2.2 g/dL (ref 2.2–3.9)
Total Protein ELP: 5.1 g/dL — ABNORMAL LOW (ref 6.0–8.5)

## 2022-02-21 LAB — KAPPA/LAMBDA LIGHT CHAINS
Kappa free light chain: 24.2 mg/L — ABNORMAL HIGH (ref 3.3–19.4)
Kappa, lambda light chain ratio: 1.13 (ref 0.26–1.65)
Lambda free light chains: 21.4 mg/L (ref 5.7–26.3)

## 2022-02-21 NOTE — Telephone Encounter (Signed)
Called to schedule follow up around 6/30, left voicemail for patient to call us back to schedule

## 2022-03-14 ENCOUNTER — Ambulatory Visit (INDEPENDENT_AMBULATORY_CARE_PROVIDER_SITE_OTHER): Payer: Medicare Other

## 2022-03-14 DIAGNOSIS — I428 Other cardiomyopathies: Secondary | ICD-10-CM | POA: Diagnosis not present

## 2022-03-14 LAB — CUP PACEART REMOTE DEVICE CHECK
Battery Remaining Longevity: 22 mo
Battery Remaining Percentage: 25 %
Battery Voltage: 2.89 V
Date Time Interrogation Session: 20230620034337
HighPow Impedance: 52 Ohm
HighPow Impedance: 52 Ohm
Implantable Lead Implant Date: 20170525
Implantable Lead Implant Date: 20170525
Implantable Lead Implant Date: 20170525
Implantable Lead Location: 753858
Implantable Lead Location: 753859
Implantable Lead Location: 753860
Implantable Lead Model: 7122
Implantable Pulse Generator Implant Date: 20170525
Lead Channel Impedance Value: 380 Ohm
Lead Channel Impedance Value: 430 Ohm
Lead Channel Impedance Value: 590 Ohm
Lead Channel Pacing Threshold Amplitude: 0.5 V
Lead Channel Pacing Threshold Amplitude: 1.25 V
Lead Channel Pacing Threshold Pulse Width: 0.5 ms
Lead Channel Pacing Threshold Pulse Width: 0.5 ms
Lead Channel Sensing Intrinsic Amplitude: 0.2 mV
Lead Channel Sensing Intrinsic Amplitude: 11.4 mV
Lead Channel Setting Pacing Amplitude: 2 V
Lead Channel Setting Pacing Amplitude: 2.5 V
Lead Channel Setting Pacing Pulse Width: 0.5 ms
Lead Channel Setting Pacing Pulse Width: 0.5 ms
Lead Channel Setting Sensing Sensitivity: 0.5 mV
Pulse Gen Serial Number: 7357926

## 2022-03-24 ENCOUNTER — Inpatient Hospital Stay: Payer: Medicare Other | Attending: Family

## 2022-03-24 ENCOUNTER — Encounter: Payer: Self-pay | Admitting: Hematology & Oncology

## 2022-03-24 ENCOUNTER — Telehealth: Payer: Self-pay

## 2022-03-24 ENCOUNTER — Inpatient Hospital Stay: Payer: Medicare Other

## 2022-03-24 ENCOUNTER — Other Ambulatory Visit: Payer: Self-pay

## 2022-03-24 ENCOUNTER — Inpatient Hospital Stay (HOSPITAL_BASED_OUTPATIENT_CLINIC_OR_DEPARTMENT_OTHER): Payer: Medicare Other | Admitting: Hematology & Oncology

## 2022-03-24 VITALS — BP 126/75 | HR 89 | Temp 98.0°F | Resp 18 | Ht 69.0 in | Wt 201.8 lb

## 2022-03-24 DIAGNOSIS — C9000 Multiple myeloma not having achieved remission: Secondary | ICD-10-CM

## 2022-03-24 DIAGNOSIS — Z5112 Encounter for antineoplastic immunotherapy: Secondary | ICD-10-CM | POA: Diagnosis not present

## 2022-03-24 DIAGNOSIS — Z79899 Other long term (current) drug therapy: Secondary | ICD-10-CM | POA: Diagnosis not present

## 2022-03-24 DIAGNOSIS — Z5111 Encounter for antineoplastic chemotherapy: Secondary | ICD-10-CM | POA: Diagnosis not present

## 2022-03-24 DIAGNOSIS — D539 Nutritional anemia, unspecified: Secondary | ICD-10-CM | POA: Diagnosis not present

## 2022-03-24 DIAGNOSIS — N189 Chronic kidney disease, unspecified: Secondary | ICD-10-CM | POA: Diagnosis not present

## 2022-03-24 DIAGNOSIS — C9001 Multiple myeloma in remission: Secondary | ICD-10-CM | POA: Diagnosis not present

## 2022-03-24 DIAGNOSIS — C9002 Multiple myeloma in relapse: Secondary | ICD-10-CM

## 2022-03-24 DIAGNOSIS — M7989 Other specified soft tissue disorders: Secondary | ICD-10-CM | POA: Diagnosis not present

## 2022-03-24 LAB — CMP (CANCER CENTER ONLY)
ALT: 15 U/L (ref 0–44)
AST: 21 U/L (ref 15–41)
Albumin: 3.5 g/dL (ref 3.5–5.0)
Alkaline Phosphatase: 63 U/L (ref 38–126)
Anion gap: 10 (ref 5–15)
BUN: 33 mg/dL — ABNORMAL HIGH (ref 8–23)
CO2: 25 mmol/L (ref 22–32)
Calcium: 8.7 mg/dL — ABNORMAL LOW (ref 8.9–10.3)
Chloride: 106 mmol/L (ref 98–111)
Creatinine: 1.99 mg/dL — ABNORMAL HIGH (ref 0.61–1.24)
GFR, Estimated: 35 mL/min — ABNORMAL LOW (ref >60)
Glucose, Bld: 139 mg/dL — ABNORMAL HIGH (ref 70–99)
Potassium: 3.3 mmol/L — ABNORMAL LOW (ref 3.5–5.1)
Sodium: 141 mmol/L (ref 135–145)
Total Bilirubin: 0.9 mg/dL (ref 0.3–1.2)
Total Protein: 5.7 g/dL — ABNORMAL LOW (ref 6.5–8.1)

## 2022-03-24 LAB — CBC WITH DIFFERENTIAL (CANCER CENTER ONLY)
Abs Immature Granulocytes: 0.01 10*3/uL (ref 0.00–0.07)
Basophils Absolute: 0 10*3/uL (ref 0.0–0.1)
Basophils Relative: 1 %
Eosinophils Absolute: 0.1 10*3/uL (ref 0.0–0.5)
Eosinophils Relative: 1 %
HCT: 34.3 % — ABNORMAL LOW (ref 39.0–52.0)
Hemoglobin: 11.2 g/dL — ABNORMAL LOW (ref 13.0–17.0)
Immature Granulocytes: 0 %
Lymphocytes Relative: 8 %
Lymphs Abs: 0.5 10*3/uL — ABNORMAL LOW (ref 0.7–4.0)
MCH: 29.3 pg (ref 26.0–34.0)
MCHC: 32.7 g/dL (ref 30.0–36.0)
MCV: 89.8 fL (ref 80.0–100.0)
Monocytes Absolute: 0.8 10*3/uL (ref 0.1–1.0)
Monocytes Relative: 13 %
Neutro Abs: 5 10*3/uL (ref 1.7–7.7)
Neutrophils Relative %: 77 %
Platelet Count: 151 10*3/uL (ref 150–400)
RBC: 3.82 MIL/uL — ABNORMAL LOW (ref 4.22–5.81)
RDW: 19.1 % — ABNORMAL HIGH (ref 11.5–15.5)
WBC Count: 6.5 10*3/uL (ref 4.0–10.5)
nRBC: 0 % (ref 0.0–0.2)

## 2022-03-24 LAB — IRON AND IRON BINDING CAPACITY (CC-WL,HP ONLY)
Iron: 30 ug/dL — ABNORMAL LOW (ref 45–182)
Saturation Ratios: 8 % — ABNORMAL LOW (ref 17.9–39.5)
TIBC: 400 ug/dL (ref 250–450)
UIBC: 370 ug/dL (ref 117–376)

## 2022-03-24 LAB — FERRITIN: Ferritin: 30 ng/mL (ref 24–336)

## 2022-03-24 LAB — LACTATE DEHYDROGENASE: LDH: 234 U/L — ABNORMAL HIGH (ref 98–192)

## 2022-03-24 MED ORDER — BORTEZOMIB CHEMO SQ INJECTION 3.5 MG (2.5MG/ML)
1.3000 mg/m2 | Freq: Once | INTRAMUSCULAR | Status: AC
  Administered 2022-03-24: 3 mg via SUBCUTANEOUS
  Filled 2022-03-24: qty 1.2

## 2022-03-24 MED ORDER — SODIUM CHLORIDE 0.9 % IV SOLN
400.0000 mg/m2 | Freq: Once | INTRAVENOUS | Status: AC
  Administered 2022-03-24: 900 mg via INTRAVENOUS
  Filled 2022-03-24: qty 40

## 2022-03-24 MED ORDER — SODIUM CHLORIDE 0.9 % IV SOLN: Freq: Once | INTRAVENOUS | Status: AC

## 2022-03-24 MED ORDER — FUSION PLUS PO CAPS: 1.0000 | ORAL_CAPSULE | Freq: Every day | ORAL | 11 refills | Status: DC

## 2022-03-24 MED ORDER — SODIUM CHLORIDE 0.9 % IV SOLN
20.0000 mg | Freq: Once | INTRAVENOUS | Status: AC
  Administered 2022-03-24: 20 mg via INTRAVENOUS
  Filled 2022-03-24: qty 2

## 2022-03-24 MED ORDER — PALONOSETRON HCL INJECTION 0.25 MG/5ML
0.2500 mg | Freq: Once | INTRAVENOUS | Status: AC
  Administered 2022-03-24: 0.25 mg via INTRAVENOUS
  Filled 2022-03-24: qty 5

## 2022-03-24 NOTE — Telephone Encounter (Signed)
-----   Message from Volanda Napoleon, MD sent at 03/24/2022  3:02 PM EDT ----- Call let him know that the iron level is incredibly low.  He really needs to take either iron by mouth or IV iron.  Let me know what he wants to do.  Thanks.Eric [name]

## 2022-03-24 NOTE — Telephone Encounter (Signed)
Called and informed patient of lab results, patient verbalized understanding and denies any questions or concerns at this time.    Patient will try over the counter iron supplments, informed him of fusion plus or feosol as recommendations. He will try. Rx sent if needed

## 2022-03-24 NOTE — Progress Notes (Signed)
Hematology and Oncology Follow Up Visit  Eric [name]., MD [id number] 08-27-1951 71 y.o. 03/24/2022   Principle Diagnosis:  IgG kappa myeloma  - 1q+   Current Therapy:        CyBorD -- s/p cycle 15 - started on 06/04/2020   Interim History:  Eric [name] is here today for follow-up and treatment.  His poor wife is having trouble.  She had a toe amputated the last week.  He is trying to help her out.  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  He is really tolerated treatment well.  We really only had 1 treatment once a month.  His last monoclonal studies were negative for myeloma in the blood.  His IgG level was 560 mg/dL.  The Kappa light chain was 2.4 mg/dL.  He has had no problems with nausea or vomiting.  He has had no change in bowel or bladder habits.  There has been no pain.  He has had no cough or shortness of breath.  There has been no bleeding.  He has had some leg swelling but this is chronic.  He has chronic anemia.  This is not doing all that bad.  He has mild renal insufficiency which, again, is chronic.   Currently, I would say his performance status is probably ECOG 1.     Medications:  Allergies as of 03/24/2022       Reactions   Lactose Intolerance (gi) Diarrhea        Medication List        Accurate as of March 24, 2022  8:41 AM. If you have any questions, ask your nurse or doctor.          acetaminophen 500 MG tablet Commonly known as: TYLENOL Take 1,000 mg by mouth every 4 (four) hours as needed for moderate pain or headache.   allopurinol 100 MG tablet Commonly known as: ZYLOPRIM Take 1 tab by mouth every other day   amiodarone 100 MG tablet Commonly known as: PACERONE TAKE 1 TABLET(100 MG) BY MOUTH DAILY   apixaban 5 MG Tabs tablet Commonly known as: Eliquis Take 1 tablet (5 mg total) by mouth 2 (two) times daily.   ARTIFICIAL TEARS OP Place 1 drop into both eyes daily as needed (dry eyes).    carvedilol 6.25 MG tablet Commonly known as: COREG TAKE 1 TABLET(6.25 MG) BY MOUTH TWICE DAILY   dapagliflozin propanediol 10 MG Tabs tablet Commonly known as: Farxiga Take 1 tablet (10 mg total) by mouth daily before breakfast.   famciclovir 500 MG tablet Commonly known as: FAMVIR Take 1 tablet (500 mg total) by mouth daily.   fluticasone 50 MCG/ACT nasal spray Commonly known as: FLONASE Place 2 sprays into both nostrils daily.   furosemide 80 MG tablet Commonly known as: LASIX TAKE 1 TABLET(80 MG) BY MOUTH DAILY   isosorbide-hydrALAZINE 20-37.5 MG tablet Commonly known as: BIDIL TAKE 1 TABLET BY MOUTH THREE TIMES DAILY   levothyroxine 75 MCG tablet Commonly known as: SYNTHROID TAKE 1 TABLET BY MOUTH DAILY IN THE MORNING. EXCEPT TAKE 1/2 TABLET ON SUNDAYS IN THE MORNING   loperamide 2 MG capsule Commonly known as: IMODIUM Take 2 mg by mouth as needed for diarrhea or loose stools.   ondansetron 8 MG tablet Commonly known as: Zofran Take 1 tablet (8 mg total) by mouth 2 (two) times daily as needed for refractory nausea / vomiting. Start on day 3 after Cytoxan.   potassium chloride  20 MEQ/15ML (10%) Soln TAKE 15 ML BY MOUTH TWICE DAILY   prochlorperazine 10 MG tablet Commonly known as: COMPAZINE Take 1 tablet (10 mg total) by mouth every 6 (six) hours as needed (Nausea or vomiting).   rosuvastatin 20 MG tablet Commonly known as: CRESTOR Take 1 tablet (20 mg total) by mouth daily.   vitamin B-12 100 MCG tablet Commonly known as: CYANOCOBALAMIN Take 100 mcg by mouth daily.   vitamin C 500 MG tablet Commonly known as: ASCORBIC ACID Take 500 mg by mouth daily.   VITAMIN D PO Take 1 capsule by mouth daily.        Allergies:  Allergies  Allergen Reactions   Lactose Intolerance (Gi) Diarrhea    Past Medical History, Surgical history, Social history, and Family History were reviewed and updated.  Review of Systems: Review of Systems  Constitutional:  Negative.   Eyes: Negative.   Respiratory: Negative.    Cardiovascular:  Positive for leg swelling.  Gastrointestinal: Negative.   Genitourinary: Negative.   Musculoskeletal: Negative.   Skin: Negative.   Neurological: Negative.   Endo/Heme/Allergies: Negative.   Psychiatric/Behavioral: Negative.       Physical Exam:  height is '5\' 9"'$  (1.753 m) and weight is 201 lb 12.8 oz (91.5 kg). His oral temperature is 98 F (36.7 C). His blood pressure is 126/75 and his pulse is 89. His respiration is 18 and oxygen saturation is 98%.   Wt Readings from Last 3 Encounters:  03/24/22 201 lb 12.8 oz (91.5 kg)  01/13/22 201 lb (91.2 kg)  12/27/21 208 lb 4 oz (94.5 kg)   His vital signs show temperature of 98.2.  Pulse 61.  Blood pressure 126/88.  Weight is 205 pounds.  Physical Exam Vitals reviewed.  HENT:     Head: Normocephalic and atraumatic.  Eyes:     Pupils: Pupils are equal, round, and reactive to light.  Cardiovascular:     Rate and Rhythm: Normal rate and regular rhythm.     Heart sounds: Normal heart sounds.  Pulmonary:     Effort: Pulmonary effort is normal.     Breath sounds: Normal breath sounds.  Abdominal:     General: Bowel sounds are normal.     Palpations: Abdomen is soft.  Musculoskeletal:        General: No tenderness or deformity. Normal range of motion.     Cervical back: Normal range of motion.  Lymphadenopathy:     Cervical: No cervical adenopathy.  Skin:    General: Skin is warm and dry.     Findings: No erythema or rash.  Neurological:     Mental Status: He is alert and oriented to person, place, and time.  Psychiatric:        Behavior: Behavior normal.        Thought Content: Thought content normal.        Judgment: Judgment normal.       Lab Results  Component Value Date   WBC 6.5 03/24/2022   HGB 11.2 (L) 03/24/2022   HCT 34.3 (L) 03/24/2022   MCV 89.8 03/24/2022   PLT 151 03/24/2022   Lab Results  Component Value Date   FERRITIN 25  01/13/2022   IRON 37 (L) 01/13/2022   TIBC 431 01/13/2022   UIBC 394 (H) 01/13/2022   IRONPCTSAT 9 (L) 01/13/2022   Lab Results  Component Value Date   RETICCTPCT 1.3 01/13/2022   RBC 3.82 (L) 03/24/2022   Lab Results  Component Value  Date   KPAFRELGTCHN 24.2 (H) 02/17/2022   LAMBDASER 21.4 02/17/2022   KAPLAMBRATIO 1.13 02/17/2022   Lab Results  Component Value Date   IGGSERUM 516 (L) 02/17/2022   IGA 89 02/17/2022   IGMSERUM 9 (L) 02/17/2022   Lab Results  Component Value Date   TOTALPROTELP 5.1 (L) 02/17/2022   ALBUMINELP 2.9 02/17/2022   A1GS 0.3 02/17/2022   A2GS 0.7 02/17/2022   BETS 0.8 02/17/2022   GAMS 0.4 02/17/2022   MSPIKE Not Observed 02/17/2022   SPEI Comment 04/22/2021     Chemistry      Component Value Date/Time   NA 139 02/17/2022 0844   NA 144 01/04/2022 1143   K 3.8 02/17/2022 0844   CL 107 02/17/2022 0844   CO2 25 02/17/2022 0844   BUN 25 (H) 02/17/2022 0844   BUN 30 (H) 01/04/2022 1143   CREATININE 1.80 (H) 02/17/2022 0844   CREATININE 1.95 (H) 09/08/2016 0859      Component Value Date/Time   CALCIUM 8.6 (L) 02/17/2022 0844   ALKPHOS 78 02/17/2022 0844   AST 20 02/17/2022 0844   ALT 16 02/17/2022 0844   BILITOT 0.8 02/17/2022 0844       Impression and Plan: Eric [name] is a very nice 71 yo African American gentleman with IgG kappa myeloma.  He has done incredibly well with treatment.  I am just happy that he is done well.  His quality of life is doing superb.  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  We will continue to follow him along and see what his monoclonal studies show.  For now, I do not see any indication that we have to make a change in his protocol.  We will plan to get him back to see [place] in another month.    Volanda Napoleon, MD 6/30/20238:41 AM

## 2022-03-24 NOTE — Patient Instructions (Signed)
[facility]  Discharge Instructions: Thank you for choosing [facility] to provide your oncology and hematology care.   If you have a lab appointment with the [facility], please go directly to the [facility] and check in at the registration area.  Wear comfortable clothing and clothing appropriate for easy access to any Portacath or PICC line.   We strive to give you quality time with your provider. You may need to reschedule your appointment if you arrive late (15 or more minutes).  Arriving late affects you and other patients whose appointments are after yours.  Also, if you miss three or more appointments without notifying the office, you may be dismissed from the clinic at the provider's discretion.      For prescription refill requests, have your pharmacy contact our office and allow 72 hours for refills to be completed.    Today you received the following chemotherapy and/or immunotherapy agents Cytoxan, Velcade.      To help prevent nausea and vomiting after your treatment, we encourage you to take your nausea medication as directed.  BELOW ARE SYMPTOMS THAT SHOULD BE REPORTED IMMEDIATELY: *FEVER GREATER THAN 100.4 F (38 C) OR HIGHER *CHILLS OR SWEATING *NAUSEA AND VOMITING THAT IS NOT CONTROLLED WITH YOUR NAUSEA MEDICATION *UNUSUAL SHORTNESS OF BREATH *UNUSUAL BRUISING OR BLEEDING *URINARY PROBLEMS (pain or burning when urinating, or frequent urination) *BOWEL PROBLEMS (unusual diarrhea, constipation, pain near the anus) TENDERNESS IN MOUTH AND THROAT WITH OR WITHOUT PRESENCE OF ULCERS (sore throat, sores in mouth, or a toothache) UNUSUAL RASH, SWELLING OR PAIN  UNUSUAL VAGINAL DISCHARGE OR ITCHING   Items with * indicate a potential emergency and should be followed up as soon as possible or go to the Emergency Department if any problems should occur.  Please show the CHEMOTHERAPY ALERT CARD or IMMUNOTHERAPY ALERT CARD at check-in  to the Emergency Department and triage nurse. Should you have questions after your visit or need to cancel or reschedule your appointment, please contact [facility]  [phone] and follow the prompts.  Office hours are 8:00 a.m. to 4:30 p.m. Monday - Friday. Please note that voicemails left after 4:00 p.m. may not be returned until the following business day.  We are closed weekends and major holidays. You have access to a nurse at all times for urgent questions. Please call the main number to the clinic [phone] and follow the prompts.  For any non-urgent questions, you may also contact your provider using MyChart. We now offer e-Visits for anyone 26 and older to request care online for non-urgent symptoms. For details visit [email or web address].   Also download the MyChart app! Go to the app store, search "MyChart", open the app, select [facility], and log in with your MyChart username and password.  Masks are optional in the cancer centers. If you would like for your care team to wear a mask while they are taking care of you, please let them know. For doctor visits, patients may have with them one support person who is at least 70 years old. At this time, visitors are not allowed in the infusion area.

## 2022-03-26 LAB — IGG, IGA, IGM
IgA: 136 mg/dL (ref 61–437)
IgG (Immunoglobin G), Serum: 671 mg/dL (ref 603–1613)
IgM (Immunoglobulin M), Srm: 58 mg/dL (ref 20–172)

## 2022-03-27 LAB — KAPPA/LAMBDA LIGHT CHAINS
Kappa free light chain: 29.8 mg/L — ABNORMAL HIGH (ref 3.3–19.4)
Kappa, lambda light chain ratio: 1.33 (ref 0.26–1.65)
Lambda free light chains: 22.4 mg/L (ref 5.7–26.3)

## 2022-03-28 LAB — PROTEIN ELECTROPHORESIS, SERUM, WITH REFLEX
A/G Ratio: 1.6 (ref 0.7–1.7)
Albumin ELP: 3.3 g/dL (ref 2.9–4.4)
Alpha-1-Globulin: 0.2 g/dL (ref 0.0–0.4)
Alpha-2-Globulin: 0.5 g/dL (ref 0.4–1.0)
Beta Globulin: 0.8 g/dL (ref 0.7–1.3)
Gamma Globulin: 0.6 g/dL (ref 0.4–1.8)
Globulin, Total: 2.1 g/dL — ABNORMAL LOW (ref 2.2–3.9)
Total Protein ELP: 5.4 g/dL — ABNORMAL LOW (ref 6.0–8.5)

## 2022-03-30 NOTE — Progress Notes (Signed)
Remote ICD transmission.   

## 2022-04-17 ENCOUNTER — Other Ambulatory Visit: Payer: Self-pay

## 2022-04-25 ENCOUNTER — Other Ambulatory Visit: Payer: Self-pay

## 2022-04-28 ENCOUNTER — Inpatient Hospital Stay (HOSPITAL_BASED_OUTPATIENT_CLINIC_OR_DEPARTMENT_OTHER): Payer: Medicare Other | Admitting: Hematology & Oncology

## 2022-04-28 ENCOUNTER — Inpatient Hospital Stay: Payer: Medicare Other | Attending: Family

## 2022-04-28 ENCOUNTER — Encounter: Payer: Self-pay | Admitting: Hematology & Oncology

## 2022-04-28 ENCOUNTER — Inpatient Hospital Stay: Payer: Medicare Other

## 2022-04-28 ENCOUNTER — Encounter: Payer: Self-pay | Admitting: Podiatry

## 2022-04-28 ENCOUNTER — Ambulatory Visit (INDEPENDENT_AMBULATORY_CARE_PROVIDER_SITE_OTHER): Payer: Medicare Other | Admitting: Podiatry

## 2022-04-28 ENCOUNTER — Other Ambulatory Visit: Payer: Self-pay

## 2022-04-28 VITALS — BP 126/85 | HR 70

## 2022-04-28 VITALS — BP 113/72 | HR 72 | Temp 97.4°F | Resp 18 | Wt 198.0 lb

## 2022-04-28 DIAGNOSIS — Z79899 Other long term (current) drug therapy: Secondary | ICD-10-CM | POA: Diagnosis not present

## 2022-04-28 DIAGNOSIS — D689 Coagulation defect, unspecified: Secondary | ICD-10-CM

## 2022-04-28 DIAGNOSIS — B351 Tinea unguium: Secondary | ICD-10-CM

## 2022-04-28 DIAGNOSIS — Z5112 Encounter for antineoplastic immunotherapy: Secondary | ICD-10-CM | POA: Insufficient documentation

## 2022-04-28 DIAGNOSIS — Z5111 Encounter for antineoplastic chemotherapy: Secondary | ICD-10-CM | POA: Diagnosis not present

## 2022-04-28 DIAGNOSIS — C9000 Multiple myeloma not having achieved remission: Secondary | ICD-10-CM

## 2022-04-28 DIAGNOSIS — R4182 Altered mental status, unspecified: Secondary | ICD-10-CM | POA: Insufficient documentation

## 2022-04-28 DIAGNOSIS — E119 Type 2 diabetes mellitus without complications: Secondary | ICD-10-CM | POA: Diagnosis not present

## 2022-04-28 DIAGNOSIS — M7989 Other specified soft tissue disorders: Secondary | ICD-10-CM | POA: Insufficient documentation

## 2022-04-28 DIAGNOSIS — D649 Anemia, unspecified: Secondary | ICD-10-CM | POA: Diagnosis not present

## 2022-04-28 DIAGNOSIS — M79674 Pain in right toe(s): Secondary | ICD-10-CM

## 2022-04-28 DIAGNOSIS — M79675 Pain in left toe(s): Secondary | ICD-10-CM | POA: Diagnosis not present

## 2022-04-28 DIAGNOSIS — L608 Other nail disorders: Secondary | ICD-10-CM | POA: Diagnosis not present

## 2022-04-28 DIAGNOSIS — C9001 Multiple myeloma in remission: Secondary | ICD-10-CM

## 2022-04-28 LAB — RETICULOCYTES
Immature Retic Fract: 13.6 % (ref 2.3–15.9)
RBC.: 4.06 MIL/uL — ABNORMAL LOW (ref 4.22–5.81)
Retic Count, Absolute: 51.6 10*3/uL (ref 19.0–186.0)
Retic Ct Pct: 1.3 % (ref 0.4–3.1)

## 2022-04-28 LAB — CBC WITH DIFFERENTIAL (CANCER CENTER ONLY)
Abs Immature Granulocytes: 0.06 10*3/uL (ref 0.00–0.07)
Basophils Absolute: 0 10*3/uL (ref 0.0–0.1)
Basophils Relative: 1 %
Eosinophils Absolute: 0.1 10*3/uL (ref 0.0–0.5)
Eosinophils Relative: 3 %
HCT: 36.9 % — ABNORMAL LOW (ref 39.0–52.0)
Hemoglobin: 12 g/dL — ABNORMAL LOW (ref 13.0–17.0)
Immature Granulocytes: 1 %
Lymphocytes Relative: 10 %
Lymphs Abs: 0.5 10*3/uL — ABNORMAL LOW (ref 0.7–4.0)
MCH: 29.5 pg (ref 26.0–34.0)
MCHC: 32.5 g/dL (ref 30.0–36.0)
MCV: 90.7 fL (ref 80.0–100.0)
Monocytes Absolute: 0.9 10*3/uL (ref 0.1–1.0)
Monocytes Relative: 17 %
Neutro Abs: 3.5 10*3/uL (ref 1.7–7.7)
Neutrophils Relative %: 68 %
Platelet Count: 126 10*3/uL — ABNORMAL LOW (ref 150–400)
RBC: 4.07 MIL/uL — ABNORMAL LOW (ref 4.22–5.81)
RDW: 19 % — ABNORMAL HIGH (ref 11.5–15.5)
WBC Count: 5.1 10*3/uL (ref 4.0–10.5)
nRBC: 0 % (ref 0.0–0.2)

## 2022-04-28 LAB — CMP (CANCER CENTER ONLY)
ALT: 19 U/L (ref 0–44)
AST: 20 U/L (ref 15–41)
Albumin: 3.7 g/dL (ref 3.5–5.0)
Alkaline Phosphatase: 82 U/L (ref 38–126)
Anion gap: 8 (ref 5–15)
BUN: 27 mg/dL — ABNORMAL HIGH (ref 8–23)
CO2: 25 mmol/L (ref 22–32)
Calcium: 8.9 mg/dL (ref 8.9–10.3)
Chloride: 107 mmol/L (ref 98–111)
Creatinine: 1.81 mg/dL — ABNORMAL HIGH (ref 0.61–1.24)
GFR, Estimated: 40 mL/min — ABNORMAL LOW (ref >60)
Glucose, Bld: 105 mg/dL — ABNORMAL HIGH (ref 70–99)
Potassium: 3.8 mmol/L (ref 3.5–5.1)
Sodium: 140 mmol/L (ref 135–145)
Total Bilirubin: 0.8 mg/dL (ref 0.3–1.2)
Total Protein: 5.5 g/dL — ABNORMAL LOW (ref 6.5–8.1)

## 2022-04-28 LAB — FERRITIN: Ferritin: 32 ng/mL (ref 24–336)

## 2022-04-28 LAB — LACTATE DEHYDROGENASE: LDH: 198 U/L — ABNORMAL HIGH (ref 98–192)

## 2022-04-28 LAB — IRON AND IRON BINDING CAPACITY (CC-WL,HP ONLY)
Iron: 95 ug/dL (ref 45–182)
Saturation Ratios: 24 % (ref 17.9–39.5)
TIBC: 391 ug/dL (ref 250–450)
UIBC: 296 ug/dL (ref 117–376)

## 2022-04-28 MED ORDER — BORTEZOMIB CHEMO SQ INJECTION 3.5 MG (2.5MG/ML)
1.3000 mg/m2 | Freq: Once | INTRAMUSCULAR | Status: AC
  Administered 2022-04-28: 3 mg via SUBCUTANEOUS
  Filled 2022-04-28: qty 1.2

## 2022-04-28 MED ORDER — SODIUM CHLORIDE 0.9 % IV SOLN
400.0000 mg/m2 | Freq: Once | INTRAVENOUS | Status: AC
  Administered 2022-04-28: 900 mg via INTRAVENOUS
  Filled 2022-04-28: qty 45

## 2022-04-28 MED ORDER — SODIUM CHLORIDE 0.9 % IV SOLN
20.0000 mg | Freq: Once | INTRAVENOUS | Status: AC
  Administered 2022-04-28: 20 mg via INTRAVENOUS
  Filled 2022-04-28: qty 20

## 2022-04-28 MED ORDER — SODIUM CHLORIDE 0.9 % IV SOLN: Freq: Once | INTRAVENOUS | Status: AC

## 2022-04-28 MED ORDER — PALONOSETRON HCL INJECTION 0.25 MG/5ML
0.2500 mg | Freq: Once | INTRAVENOUS | Status: AC
  Administered 2022-04-28: 0.25 mg via INTRAVENOUS
  Filled 2022-04-28: qty 5

## 2022-04-28 NOTE — Progress Notes (Signed)
This patient returns to my office for at risk foot care.  This patient requires this care by a professional since this patient will be at risk due to having diabetes type 2, chronic kidney disease and coagulation defect.  Patient is taking eliquisThis patient is unable to cut nails himself since the patient cannot reach his nails.These nails are painful walking and wearing shoes.  This patient presents for at risk foot care today.  General Appearance  Alert, conversant and in no acute stress.  Vascular  Dorsalis pedis and posterior tibial  pulses are palpable  bilaterally.  Capillary return is within normal limits  bilaterally. Temperature is within normal limits  bilaterally.  Neurologic  Senn-Weinstein monofilament wire test within normal limits  bilaterally. Muscle power within normal limits bilaterally.  Nails Thick disfigured discolored nails with subungual debris  from hallux to fifth toes bilaterally. No evidence of bacterial infection or drainage bilaterally.  Orthopedic  No limitations of motion  feet .  No crepitus or effusions noted.  No bony pathology or digital deformities noted.  Skin  normotropic skin with no porokeratosis noted bilaterally.  No signs of infections or ulcers noted.     Onychomycosis  Pain in right toes  Pain in left toes  Consent was obtained for treatment procedures.   Mechanical debridement of nails 1-5  bilaterally performed with a nail nipper.  Filed with dremel without incident.    Return office visit   10 weeks                   Told patient to return for periodic foot care and evaluation due to potential at risk complications.   Geneva Pallas DPM  

## 2022-04-28 NOTE — Progress Notes (Signed)
Hematology and Oncology Follow Up Visit  Eric [name]., MD [id number] 04-18-1951 71 y.o. 04/28/2022   Principle Diagnosis:  IgG kappa myeloma  - 1q+   Current Therapy:        CyBorD -- s/p cycle 13 - started on 06/04/2020   Interim History:  [name] is here today for follow-up and treatment.  His poor wife is having trouble.  She is in the hospital.  Unfortunately, she had to have a BKA.  She just is not healing up from a toe that got taken off.  She also had some mental status changes, likely because of the medications.  Hopefully, she will be able to go to some kind of rehab facility.  He is doing his best to try to help her.  A son is coming to town and also try to help.  His myeloma studies back in June did not show monoclonal spike in the blood.  The IgG level was 671 mg/dL.  The Kappa light chain was 3 mg/dL.  He is still operating.  He is coming back a little bit because of his wife's hospitalization.  He is now taking oral iron.  This is helped his anemia quite nicely.  He has had no change in bowel or bladder habits.  He has had no issues with nausea or vomiting.  He has had no cough or shortness of breath.  Little swelling, which is chronic, in the right leg.  Overall, I would say his performance status is ECOG 1.    Medications:  Allergies as of 04/28/2022       Reactions   Lactose Intolerance (gi) Diarrhea        Medication List        Accurate as of April 28, 2022  2:30 PM. If you have any questions, ask your nurse or doctor.          acetaminophen 500 MG tablet Commonly known as: TYLENOL Take 1,000 mg by mouth every 4 (four) hours as needed for moderate pain or headache.   allopurinol 100 MG tablet Commonly known as: ZYLOPRIM Take 1 tab by mouth every other day   amiodarone 100 MG tablet Commonly known as: PACERONE TAKE 1 TABLET(100 MG) BY MOUTH DAILY   apixaban 5 MG Tabs tablet Commonly known as: Eliquis Take 1 tablet (5 mg total)  by mouth 2 (two) times daily.   ARTIFICIAL TEARS OP Place 1 drop into both eyes daily as needed (dry eyes).   ascorbic acid 500 MG tablet Commonly known as: VITAMIN C Take 500 mg by mouth daily.   carvedilol 6.25 MG tablet Commonly known as: COREG TAKE 1 TABLET(6.25 MG) BY MOUTH TWICE DAILY   dapagliflozin propanediol 10 MG Tabs tablet Commonly known as: Farxiga Take 1 tablet (10 mg total) by mouth daily before breakfast.   famciclovir 500 MG tablet Commonly known as: FAMVIR Take 1 tablet (500 mg total) by mouth daily.   fluticasone 50 MCG/ACT nasal spray Commonly known as: FLONASE Place 2 sprays into both nostrils daily.   furosemide 80 MG tablet Commonly known as: LASIX TAKE 1 TABLET(80 MG) BY MOUTH DAILY   Fusion Plus Caps Take 1 capsule by mouth daily.   isosorbide-hydrALAZINE 20-37.5 MG tablet Commonly known as: BIDIL TAKE 1 TABLET BY MOUTH THREE TIMES DAILY   levothyroxine 75 MCG tablet Commonly known as: SYNTHROID TAKE 1 TABLET BY MOUTH DAILY IN THE MORNING. EXCEPT TAKE 1/2 TABLET ON SUNDAYS IN THE MORNING   loperamide 2  MG capsule Commonly known as: IMODIUM Take 2 mg by mouth as needed for diarrhea or loose stools.   ondansetron 8 MG tablet Commonly known as: Zofran Take 1 tablet (8 mg total) by mouth 2 (two) times daily as needed for refractory nausea / vomiting. Start on day 3 after Cytoxan.   oxyCODONE-acetaminophen 5-325 MG tablet Commonly known as: PERCOCET/ROXICET (Schedule II Drug) TK 1 T PO Q 6 H PRN Oral for 15   potassium chloride 20 MEQ/15ML (10%) Soln TAKE 15 ML BY MOUTH TWICE DAILY   prochlorperazine 10 MG tablet Commonly known as: COMPAZINE Take 1 tablet (10 mg total) by mouth every 6 (six) hours as needed (Nausea or vomiting).   rosuvastatin 20 MG tablet Commonly known as: CRESTOR Take 1 tablet (20 mg total) by mouth daily.   vitamin B-12 100 MCG tablet Commonly known as: CYANOCOBALAMIN Take 100 mcg by mouth daily.   VITAMIN D  PO Take 1 capsule by mouth daily.        Allergies:  Allergies  Allergen Reactions   Lactose Intolerance (Gi) Diarrhea    Past Medical History, Surgical history, Social history, and Family History were reviewed and updated.  Review of Systems: Review of Systems  Constitutional: Negative.   Eyes: Negative.   Respiratory: Negative.    Cardiovascular:  Positive for leg swelling.  Gastrointestinal: Negative.   Genitourinary: Negative.   Musculoskeletal: Negative.   Skin: Negative.   Neurological: Negative.   Endo/Heme/Allergies: Negative.   Psychiatric/Behavioral: Negative.       Physical Exam:  weight is 198 lb (89.8 kg). His oral temperature is 97.4 F (36.3 C) (abnormal). His blood pressure is 113/72 and his pulse is 72. His respiration is 18 and oxygen saturation is 98%.   Wt Readings from Last 3 Encounters:  04/28/22 198 lb (89.8 kg)  03/24/22 201 lb 12.8 oz (91.5 kg)  01/13/22 201 lb (91.2 kg)   His vital signs show temperature of 98.2.  Pulse 61.  Blood pressure 126/88.  Weight is 205 pounds.  Physical Exam Vitals reviewed.  HENT:     Head: Normocephalic and atraumatic.  Eyes:     Pupils: Pupils are equal, round, and reactive to light.  Cardiovascular:     Rate and Rhythm: Normal rate and regular rhythm.     Heart sounds: Normal heart sounds.  Pulmonary:     Effort: Pulmonary effort is normal.     Breath sounds: Normal breath sounds.  Abdominal:     General: Bowel sounds are normal.     Palpations: Abdomen is soft.  Musculoskeletal:        General: No tenderness or deformity. Normal range of motion.     Cervical back: Normal range of motion.  Lymphadenopathy:     Cervical: No cervical adenopathy.  Skin:    General: Skin is warm and dry.     Findings: No erythema or rash.  Neurological:     Mental Status: He is alert and oriented to person, place, and time.  Psychiatric:        Behavior: Behavior normal.        Thought Content: Thought content  normal.        Judgment: Judgment normal.     Lab Results  Component Value Date   WBC 5.1 04/28/2022   HGB 12.0 (L) 04/28/2022   HCT 36.9 (L) 04/28/2022   MCV 90.7 04/28/2022   PLT 126 (L) 04/28/2022   Lab Results  Component Value Date  FERRITIN 30 03/24/2022   IRON 95 04/28/2022   TIBC 391 04/28/2022   UIBC 296 04/28/2022   IRONPCTSAT 24 04/28/2022   Lab Results  Component Value Date   RETICCTPCT 1.3 04/28/2022   RBC 4.06 (L) 04/28/2022   Lab Results  Component Value Date   KPAFRELGTCHN 29.8 (H) 03/24/2022   LAMBDASER 22.4 03/24/2022   KAPLAMBRATIO 1.33 03/24/2022   Lab Results  Component Value Date   IGGSERUM 671 03/24/2022   IGA 136 03/24/2022   IGMSERUM 58 03/24/2022   Lab Results  Component Value Date   TOTALPROTELP 5.4 (L) 03/24/2022   ALBUMINELP 3.3 03/24/2022   A1GS 0.2 03/24/2022   A2GS 0.5 03/24/2022   BETS 0.8 03/24/2022   GAMS 0.6 03/24/2022   MSPIKE Not Observed 03/24/2022   SPEI Comment 04/22/2021     Chemistry      Component Value Date/Time   NA 140 04/28/2022 1008   NA 144 01/04/2022 1143   K 3.8 04/28/2022 1008   CL 107 04/28/2022 1008   CO2 25 04/28/2022 1008   BUN 27 (H) 04/28/2022 1008   BUN 30 (H) 01/04/2022 1143   CREATININE 1.81 (H) 04/28/2022 1008   CREATININE 1.95 (H) 09/08/2016 0859      Component Value Date/Time   CALCIUM 8.9 04/28/2022 1008   ALKPHOS 82 04/28/2022 1008   AST 20 04/28/2022 1008   ALT 19 04/28/2022 1008   BILITOT 0.8 04/28/2022 1008       Impression and Plan: Mr. Stauffer is a very nice 71 yo African American gentleman with IgG kappa myeloma.  He has done incredibly well with treatment.  I am just happy that he is done well.  His quality of life is doing superbly.  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  Again, it sounds like he will need to have his wife and somehow a rehab facility.  I do not see any need to change his protocol.  He really is  doing well.  I will plan to see him back probably in September.     Volanda Napoleon, MD 8/4/20232:30 PM

## 2022-04-30 LAB — IGG, IGA, IGM
IgA: 97 mg/dL (ref 61–437)
IgG (Immunoglobin G), Serum: 619 mg/dL (ref 603–1613)
IgM (Immunoglobulin M), Srm: 27 mg/dL (ref 20–172)

## 2022-05-01 LAB — KAPPA/LAMBDA LIGHT CHAINS
Kappa free light chain: 25.9 mg/L — ABNORMAL HIGH (ref 3.3–19.4)
Kappa, lambda light chain ratio: 1.5 (ref 0.26–1.65)
Lambda free light chains: 17.3 mg/L (ref 5.7–26.3)

## 2022-05-02 ENCOUNTER — Other Ambulatory Visit: Payer: Self-pay | Admitting: Internal Medicine

## 2022-05-06 LAB — PROTEIN ELECTROPHORESIS, SERUM, WITH REFLEX
A/G Ratio: 1.5 (ref 0.7–1.7)
Albumin ELP: 3.1 g/dL (ref 2.9–4.4)
Alpha-1-Globulin: 0.2 g/dL (ref 0.0–0.4)
Alpha-2-Globulin: 0.5 g/dL (ref 0.4–1.0)
Beta Globulin: 0.8 g/dL (ref 0.7–1.3)
Gamma Globulin: 0.5 g/dL (ref 0.4–1.8)
Globulin, Total: 2.1 g/dL — ABNORMAL LOW (ref 2.2–3.9)
M-Spike, %: 0.1 g/dL — ABNORMAL HIGH
SPEP Interpretation: 0
Total Protein ELP: 5.2 g/dL — ABNORMAL LOW (ref 6.0–8.5)

## 2022-05-06 LAB — IMMUNOFIXATION REFLEX, SERUM
IgA: 105 mg/dL (ref 61–437)
IgG (Immunoglobin G), Serum: 686 mg/dL (ref 603–1613)
IgM (Immunoglobulin M), Srm: 27 mg/dL (ref 20–172)

## 2022-05-17 ENCOUNTER — Other Ambulatory Visit: Payer: Self-pay | Admitting: Internal Medicine

## 2022-05-17 ENCOUNTER — Other Ambulatory Visit: Payer: Medicare Other

## 2022-05-17 DIAGNOSIS — R61 Generalized hyperhidrosis: Secondary | ICD-10-CM

## 2022-05-17 MED ORDER — PREDNISONE 10 MG (21) PO TBPK: ORAL_TABLET | ORAL | 0 refills | Status: DC

## 2022-05-17 NOTE — Progress Notes (Signed)
Pt called regarding gout flare. Recent labs reviewed. Will send rx prednisone as requested.   Also mentions having night sweats last night. Wife is in rehab, they have started masking. Wants COVID test.   He agrees to come in tomorrow for testing outside.   RS

## 2022-05-18 ENCOUNTER — Other Ambulatory Visit: Payer: Medicare Other

## 2022-05-18 ENCOUNTER — Telehealth (INDEPENDENT_AMBULATORY_CARE_PROVIDER_SITE_OTHER): Payer: Medicare Other | Admitting: Internal Medicine

## 2022-05-18 DIAGNOSIS — U071 COVID-19: Secondary | ICD-10-CM | POA: Diagnosis not present

## 2022-05-18 NOTE — Patient Instructions (Signed)
Quarantine and Isolation Quarantine and isolation refer to local and travel restrictions to protect the public and travelers from contagious diseases that constitute a public health threat. Contagious diseases are diseases that can spread from one person to another. Quarantine and isolation help to protect the public by preventing exposure to people who have or may have a contagious disease. Isolation separates people who are sick with a contagious disease from people who are not sick. Quarantine separates and restricts the movement of people who were exposed to a contagious disease to see if they become sick. You may be put in quarantine or isolation if you have been exposed to or diagnosed with any of the following diseases: Severe acute respiratory syndromes, such as COVID-19. Cholera. Diphtheria. Tuberculosis. Plague. Smallpox. Yellow fever. Viral hemorrhagic fevers, such as [place], Ebola, and [place]. When to quarantine or isolate Follow these rules, whether you have been vaccinated or not: Stay home and isolate from others when you are sick with a contagious disease. Isolate when you test positive for a contagious disease, even if you do not have symptoms. Isolate if you are sick and suspect that you may have a contagious disease. If you suspect that you have a contagious disease, get tested. If your test results are negative, you can end your isolation. If your test results are positive, follow the full isolation recommendations as told by your health care provider or local health authorities. Quarantine and stay away from others when you have been in close contact with someone who has tested positive for a contagious disease. Close contact is defined as being less than 6 ft (1.8 m) away from an infected person for a total of 15 minutes or more over a 24-hour period. Do not go to places where you are unable to wear a mask, such as restaurants and some gyms. Stay home and separate  from others as much as possible. Avoid being around people who may get very sick from the contagious disease that you have. Use a separate bathroom, if possible. Do not travel. For travel guidance, visit the CDC's travel webpage at wwwnc.cdc.gov/travel/ Follow these instructions at home: Medicines  Take over-the-counter and prescription medicines as told by your health care provider. Finish all antibiotic medicine even when you start to feel better. Stay up to date with all your vaccines. Get scheduled vaccines and boosters as recommended by your health care provider. Lifestyle Wear a high-quality mask if you must be around others at home and in public, if recommended. Improve air flow (ventilation) at home to help prevent the disease from spreading to other people, if possible. Do not share personal household items, like cups, towels, and utensils. Practice everyday hygiene and cleaning. General instructions Talk to your health care provider if you have a weakened body defense system (immune system). People with a weakened immune system may have a reduced immune response to vaccines. You may need to follow current prevention measures, including wearing a well-fitting mask, avoiding crowds, and avoiding poorly ventilated indoor places. Monitor symptoms and follow health care provider instructions, which may include resting, drinking fluids, and taking medicines. Follow specific isolation and quarantine recommendations if you are in places that can lead to disease outbreaks, such as correctional and detention facilities, homeless shelters, and cruise ships. Return to your normal activities as told by your health care provider. Ask your health care provider what activities are safe for you. Keep all follow-up visits. This is important. Where to find more information CDC: www.cdc.gov/quarantine/index.html Contact   a health care provider if: You have a fever. You have signs and symptoms that  return or get worse after isolation. Get help right away if: You have difficulty breathing. You have chest pain. These symptoms may be an emergency. Get help right away. Call 911. Do not wait to see if the symptoms will go away. Do not drive yourself to the hospital. Summary Isolation and quarantine help protect the public by preventing exposure to people who have or may have a contagious disease. Isolate when you are sick or when you test positive, even if you do not have symptoms. Quarantine and stay away from others when you have been in close contact with someone who has tested positive for a contagious disease. This information is not intended to replace advice given to you by your health care provider. Make sure you discuss any questions you have with your health care provider. Document Revised: 09/22/2021 Document Reviewed: 09/01/2021 Elsevier Patient Education  2023 Elsevier Inc.  

## 2022-05-18 NOTE — Progress Notes (Signed)
Virtual Visit via Video   This visit type was conducted due to national recommendations for restrictions regarding the COVID-19 Pandemic (e.g. social distancing) in an effort to limit this patient's exposure and mitigate transmission in our community.  Due to his co-morbid illnesses, this patient is at least at moderate risk for complications without adequate follow up.  This format is felt to be most appropriate for this patient at this time.  All issues noted in this document were discussed and addressed.  A limited physical exam was performed with this format.    This visit type was conducted due to national recommendations for restrictions regarding the COVID-19 Pandemic (e.g. social distancing) in an effort to limit this patient's exposure and mitigate transmission in our community.  Patients identity confirmed using two different identifiers.  This format is felt to be most appropriate for this patient at this time.  All issues noted in this document were discussed and addressed.  No physical exam was performed (except for noted visual exam findings with Video Visits).    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    The patient does have symptoms concerning for COVID-19 infection (fever, chills, cough, or new shortness of breath).   He presents today for virtual visit. He prefers this method of contact due to COVID-19 pandemic.  He presents today for further evaluation of night sweats and chills. Sx started Tuesday night. There is no associated cough, chest pain or shortness of breath. No documented fever. No known ill contacts.   He agreed to COVID testing today, rapid test is positive. He was tested  prior to virtual visit. He is interested in antiviral treatment.      Past Medical History:  Diagnosis Date   Acute blood loss anemia    Acute encephalopathy    Acute hypoxemic respiratory failure (HCC)    Acute idiopathic gout of right ankle    Acute lumbar back pain    Acute on chronic systolic heart failure (HCC)    Acute respiratory failure (HCC)    AKI (acute kidney injury) ([place])    Altered mental status    Aortic aneurysm without rupture ([place]) 02/09/2016   Aortic root enlargement ([place]) 02/13/2012   Aortic valve regurgitation, acquired 02/13/2012   Arrhythmia    Atrial flutter ([place]) 03/18/2012   Back pain    Cardiac arrest ([place]) 02/01/2016   CHF (congestive heart failure) ([place])    Chronic anticoagulation 03/04/2013   Chronic kidney disease    kidney fx studies increased    Chronic lower back pain    Chronic renal insufficiency    Chronic systolic heart failure ([place]) 02/13/2012   Recent diagnosis 4 / 2013, LVEF 25% by Echo 12/2011  03/2013: Echo at [facility] Cardiology Conclusions: 1. Left ventricular ejection fraction estimated by 2D at 40-45 percent. 2. Mild concentric left ventricular hypertrophy. 3. Mild left atrial enlargement. 4. Moderate aortic valve regurgitation. 5. The aortic root at the sinus(es) of valsalva is moderately dilated 6. Mild mitral valve regurgitation. 7.   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/22/2016   Femoral neuropathy    Goals of care, counseling/discussion 05/05/2020   HCAP (healthcare-associated pneumonia)    Heart murmur    Hyperlipidemia 02/13/2012   Hypertension    Hypothyroidism     Internal hemorrhoids without mention of complication 3/56/8616   Labile blood pressure    Left bundle branch block 02/13/2012   Leg weakness, bilateral    Long term (current) use of anticoagulants 08/06/2013   Eliquis therapy    Long term current use of amiodarone 08/07/2016   Lower extremity weakness    Migraine 02/13/12   "opthalmic"   Multiple myeloma ([place]) 05/05/2020   Non-traumatic rhabdomyolysis    Obesity (BMI 30-39.9) 02/13/2012   Pain    Paroxysmal atrial fibrillation (HCC)    Pneumonia    Retroperitoneal bleed    Right ankle pain    Right knee pain    Severe aortic regurgitation 02/09/2016   Special screening for malignant neoplasms, colon 04/11/2013   Thyroid activity decreased    Tibial pain    Varicose vein of leg    right   Ventricular fibrillation ([place]) 02/09/2016   Weakness of both lower extremities    Past Surgical History:  Procedure Laterality Date   AORTIC VALVE REPLACEMENT  11/20/2018   CARDIAC CATHETERIZATION N/A 02/17/2016   Procedure: Left Heart Cath and Coronary Angiography;  Surgeon: Jettie Booze, MD;  Location: [place] CV LAB;  Service: Cardiovascular;  Laterality: N/A;   CARDIOVERSION  03/22/2012   Procedure: CARDIOVERSION;  Surgeon: Candee Furbish, MD;  Location: [facility] ENDOSCOPY;  Service: Cardiovascular;  Laterality: N/A;   CARDIOVERSION  04/19/2012   Procedure: CARDIOVERSION;  Surgeon: Sinclair Grooms, MD;  Location: [place];  Service: Cardiovascular;  Laterality: N/A;   COLONOSCOPY N/A 04/11/2013   Procedure: COLONOSCOPY;  Surgeon: Inda Castle, MD;  Location: [facility] ENDOSCOPY;  Service: Endoscopy;  Laterality: N/A;   COLONOSCOPY N/A 04/11/2013   Procedure: COLONOSCOPY;  Surgeon: Inda Castle, MD;  Location: [facility] ENDOSCOPY;  Service: Endoscopy;  Laterality: N/A;   EP IMPLANTABLE DEVICE N/A 02/17/2016   Procedure: BiV ICD Insertion CRT-D;  Surgeon: Evans Lance, MD;  Location: [place] CV LAB;  Service: Cardiovascular;  Laterality: N/A;   FINGER  SURGERY  2012   "4th digit right hand; thumb on left hand"   RADIOLOGY WITH ANESTHESIA N/A 02/11/2016   Procedure: MRI OF THE BRAIN WITHOUT CONTRAST, LUMBAR WITHOUT CONTRAST;  Surgeon: Medication Radiologist, MD;  Location: [place];  Service: Radiology;  Laterality: N/A;  DR. WOOD/MRI   RIGHT/LEFT HEART CATH AND CORONARY ANGIOGRAPHY N/A 07/26/2018   Procedure: RIGHT/LEFT HEART CATH AND CORONARY ANGIOGRAPHY;  Surgeon: Belva Crome, MD;  Location: [place] CV LAB;  Service: Cardiovascular;  Laterality: N/A;   Skin melanocytoma excision  2012   "above left clavicle"   STERNAL INCISION RECLOSURE  11/2018   STERNAL WIRE REMOVAL  11/2018   STERNAL WOUND DEBRIDEMENT  11/2018   TEE WITHOUT CARDIOVERSION  03/22/2012   Procedure: TRANSESOPHAGEAL ECHOCARDIOGRAM (TEE);  Surgeon: Candee Furbish, MD;  Location: [facility] ENDOSCOPY;  Service: Cardiovascular;  Laterality: N/A;   TEE WITHOUT CARDIOVERSION N/A 02/08/2016   Procedure:  TRANSESOPHAGEAL ECHOCARDIOGRAM (TEE);  Surgeon: Lelon Perla, MD;  Location: [facility] ENDOSCOPY;  Service: Cardiovascular;  Laterality: N/A;     No outpatient medications have been marked as taking for the 05/18/22 encounter (Video Visit) with Glendale Chard, MD.     Allergies:   Lactose intolerance (gi)   Social History   Tobacco Use   Smoking status: Never   Smokeless tobacco: Never  Vaping Use   Vaping Use: Never used  Substance Use Topics   Alcohol use: Not Currently    Comment: rarely   Drug use: No     Family Hx: The patient's family history includes Diabetes in his mother; Heart disease in his father and mother; Heart failure in his father and mother; Hypertension in his father and mother.  ROS:   Please see the history of present illness.    Review of Systems  Constitutional:  Positive for chills and malaise/fatigue.  Respiratory: Negative.    Cardiovascular: Negative.   Gastrointestinal: Negative.   Neurological: Negative.   Psychiatric/Behavioral: Negative.       All other systems reviewed and are negative.   Labs/Other Tests and Data Reviewed:    Recent Labs: 08/10/2021: TSH 2.660 04/28/2022: ALT 19; BUN 27; Creatinine 1.81; Hemoglobin 12.0; Platelet Count 126; Potassium 3.8; Sodium 140   Recent Lipid Panel Lab Results  Component Value Date/Time   CHOL 121 05/13/2019 03:49 PM   TRIG 48 05/13/2019 03:49 PM   HDL 49 05/13/2019 03:49 PM   CHOLHDL 2.5 05/13/2019 03:49 PM   CHOLHDL 3.8 02/13/2012 06:29 PM   LDLCALC 62 05/13/2019 03:49 PM    Wt Readings from Last 3 Encounters:  04/28/22 198 lb (89.8 kg)  03/24/22 201 lb 12.8 oz (91.5 kg)  01/13/22 201 lb (91.2 kg)     Exam:    Vital Signs:  There were no vitals taken for this visit.    Physical Exam Vitals and nursing note reviewed.  Constitutional:      Appearance: Normal appearance.  HENT:     Head: Normocephalic and atraumatic.  Eyes:     Extraocular Movements: Extraocular movements intact.  Pulmonary:     Effort: Pulmonary effort is normal.  Musculoskeletal:     Cervical back: Normal range of motion.  Neurological:     Mental Status: He is alert and oriented to person, place, and time.  Psychiatric:        Mood and Affect: Affect normal.     ASSESSMENT & PLAN:    1. COVID-19 Comments: He initially requested treatment. However, after further discussion, he has decided to defer antiviral treatment. Advised patient to take Vitamin C, D, Zinc.  Keep yourself hydrated with a lot of water and rest. Take Delsym for cough and Mucinex as needed. Take Tylenol or pain reliever every 4-6 hours as needed for pain/fever/body ache. If you have elevated blood pressure, you can take OTC Coricidin. You can also take OTC Oscillococcinum, a homeopathic remedy,  to help with your symptoms.  Educated patient that if symptoms get worse or if he/she experiences any SOB, chest pain or pain in their legs to seek immediate emergency care. Continue to monitor your oxygen levels. Call office ASAP if  you have any questions. Quarantine for 5 days if tested positive and no symptoms or 10 days if tested positive and you are with symptoms. Wear a mask around other people.   COVID-19 Education: The signs and symptoms of COVID-19 were discussed with the patient and how to seek  care for testing (follow up with PCP or arrange E-visit).  The importance of social distancing was discussed today.  Patient Risk:   After full review of this patients clinical status, I feel that they are at least moderate risk at this time.  Time:   Today, I have spent 8 minutes/ seconds with the patient with telehealth technology discussing above diagnoses.     Medication Adjustments/Labs and Tests Ordered: Current medicines are reviewed at length with the patient today.  Concerns regarding medicines are outlined above.   Tests Ordered: No orders of the defined types were placed in this encounter.   Medication Changes: No orders of the defined types were placed in this encounter.   Disposition:  Follow up prn  Signed, [name], MD

## 2022-05-22 ENCOUNTER — Other Ambulatory Visit: Payer: Self-pay | Admitting: Internal Medicine

## 2022-05-28 ENCOUNTER — Other Ambulatory Visit: Payer: Self-pay | Admitting: Internal Medicine

## 2022-05-30 ENCOUNTER — Encounter: Payer: Self-pay | Admitting: Internal Medicine

## 2022-05-31 ENCOUNTER — Ambulatory Visit (INDEPENDENT_AMBULATORY_CARE_PROVIDER_SITE_OTHER): Payer: Medicare Other

## 2022-05-31 VITALS — BP 122/80 | HR 88 | Temp 98.4°F | Ht 67.0 in | Wt 195.0 lb

## 2022-05-31 DIAGNOSIS — Z0000 Encounter for general adult medical examination without abnormal findings: Secondary | ICD-10-CM

## 2022-05-31 DIAGNOSIS — E119 Type 2 diabetes mellitus without complications: Secondary | ICD-10-CM

## 2022-05-31 LAB — POCT URINALYSIS DIPSTICK
Bilirubin, UA: NEGATIVE
Blood, UA: NEGATIVE
Glucose, UA: POSITIVE — AB
Ketones, UA: NEGATIVE
Leukocytes, UA: NEGATIVE
Nitrite, UA: NEGATIVE
Protein, UA: POSITIVE — AB
Spec Grav, UA: 1.02 (ref 1.010–1.025)
Urobilinogen, UA: 0.2 E.U./dL
pH, UA: 6.5 (ref 5.0–8.0)

## 2022-05-31 NOTE — Addendum Note (Signed)
Addended by: Kellie Simmering on: 05/31/2022 09:57 AM   Modules accepted: Orders

## 2022-05-31 NOTE — Patient Instructions (Signed)
Eric [name] , Thank you for taking time to come for your Medicare Wellness Visit. I appreciate your ongoing commitment to your health goals. Please review the following plan we discussed and let me know if I can assist you in the future.   Screening recommendations/referrals: Colonoscopy: completed 04/11/2013, due 04/12/2023 Recommended yearly ophthalmology/optometry visit for glaucoma screening and checkup Recommended yearly dental visit for hygiene and checkup  Vaccinations: Influenza vaccine: due Pneumococcal vaccine: due Tdap vaccine: completed 04/11/2013, due 04/12/2023 Shingles vaccine: discussed   Covid-19:  08/23/2020, 10/30/2019, 10/09/2019  Advanced directives: Advance directive discussed with you today. Even though you declined this today please call our office should you change your mind and we can give you the proper paperwork for you to fill out.  Conditions/risks identified: none  Next appointment: Follow up in one year for your annual wellness visit.   Preventive Care 4 Years and Older, Male Preventive care refers to lifestyle choices and visits with your health care provider that can promote health and wellness. What does preventive care include? A yearly physical exam. This is also called an annual well check. Dental exams once or twice a year. Routine eye exams. Ask your health care provider how often you should have your eyes checked. Personal lifestyle choices, including: Daily care of your teeth and gums. Regular physical activity. Eating a healthy diet. Avoiding tobacco and drug use. Limiting alcohol use. Practicing safe sex. Taking low doses of aspirin every day. Taking vitamin and mineral supplements as recommended by your health care provider. What happens during an annual well check? The services and screenings done by your health care provider during your annual well check will depend on your age, overall health, lifestyle risk factors, and family history  of disease. Counseling  Your health care provider may ask you questions about your: Alcohol use. Tobacco use. Drug use. Emotional well-being. Home and relationship well-being. Sexual activity. Eating habits. History of falls. Memory and ability to understand (cognition). Work and work Statistician. Screening  You may have the following tests or measurements: Height, weight, and BMI. Blood pressure. Lipid and cholesterol levels. These may be checked every 5 years, or more frequently if you are over 23 years old. Skin check. Lung cancer screening. You may have this screening every year starting at age 57 if you have a 30-pack-year history of smoking and currently smoke or have quit within the past 15 years. Fecal occult blood test (FOBT) of the stool. You may have this test every year starting at age 47. Flexible sigmoidoscopy or colonoscopy. You may have a sigmoidoscopy every 5 years or a colonoscopy every 10 years starting at age 69. Prostate cancer screening. Recommendations will vary depending on your family history and other risks. Hepatitis C blood test. Hepatitis B blood test. Sexually transmitted disease (STD) testing. Diabetes screening. This is done by checking your blood sugar (glucose) after you have not eaten for a while (fasting). You may have this done every 1-3 years. Abdominal aortic aneurysm (AAA) screening. You may need this if you are a current or former smoker. Osteoporosis. You may be screened starting at age 29 if you are at high risk. Talk with your health care provider about your test results, treatment options, and if necessary, the need for more tests. Vaccines  Your health care provider may recommend certain vaccines, such as: Influenza vaccine. This is recommended every year. Tetanus, diphtheria, and acellular pertussis (Tdap, Td) vaccine. You may need a Td booster every 10 years. Zoster vaccine.  You may need this after age 21. Pneumococcal 13-valent  conjugate (PCV13) vaccine. One dose is recommended after age 8. Pneumococcal polysaccharide (PPSV23) vaccine. One dose is recommended after age 45. Talk to your health care provider about which screenings and vaccines you need and how often you need them. This information is not intended to replace advice given to you by your health care provider. Make sure you discuss any questions you have with your health care provider. Document Released: 10/08/2015 Document Revised: 05/31/2016 Document Reviewed: 07/13/2015 Elsevier Interactive Patient Education  2017 [place] Prevention in the Home Falls can cause injuries. They can happen to people of all ages. There are many things you can do to make your home safe and to help prevent falls. What can I do on the outside of my home? Regularly fix the edges of walkways and driveways and fix any cracks. Remove anything that might make you trip as you walk through a door, such as a raised step or threshold. Trim any bushes or trees on the path to your home. Use bright outdoor lighting. Clear any walking paths of anything that might make someone trip, such as rocks or tools. Regularly check to see if handrails are loose or broken. Make sure that both sides of any steps have handrails. Any raised decks and porches should have guardrails on the edges. Have any leaves, snow, or ice cleared regularly. Use sand or salt on walking paths during winter. Clean up any spills in your garage right away. This includes oil or grease spills. What can I do in the bathroom? Use night lights. Install grab bars by the toilet and in the tub and shower. Do not use towel bars as grab bars. Use non-skid mats or decals in the tub or shower. If you need to sit down in the shower, use a plastic, non-slip stool. Keep the floor dry. Clean up any water that spills on the floor as soon as it happens. Remove soap buildup in the tub or shower regularly. Attach bath mats  securely with double-sided non-slip rug tape. Do not have throw rugs and other things on the floor that can make you trip. What can I do in the bedroom? Use night lights. Make sure that you have a light by your bed that is easy to reach. Do not use any sheets or blankets that are too big for your bed. They should not hang down onto the floor. Have a firm chair that has side arms. You can use this for support while you get dressed. Do not have throw rugs and other things on the floor that can make you trip. What can I do in the kitchen? Clean up any spills right away. Avoid walking on wet floors. Keep items that you use a lot in easy-to-reach places. If you need to reach something above you, use a strong step stool that has a grab bar. Keep electrical cords out of the way. Do not use floor polish or wax that makes floors slippery. If you must use wax, use non-skid floor wax. Do not have throw rugs and other things on the floor that can make you trip. What can I do with my stairs? Do not leave any items on the stairs. Make sure that there are handrails on both sides of the stairs and use them. Fix handrails that are broken or loose. Make sure that handrails are as long as the stairways. Check any carpeting to make sure that it is  firmly attached to the stairs. Fix any carpet that is loose or worn. Avoid having throw rugs at the top or bottom of the stairs. If you do have throw rugs, attach them to the floor with carpet tape. Make sure that you have a light switch at the top of the stairs and the bottom of the stairs. If you do not have them, ask someone to add them for you. What else can I do to help prevent falls? Wear shoes that: Do not have high heels. Have rubber bottoms. Are comfortable and fit you well. Are closed at the toe. Do not wear sandals. If you use a stepladder: Make sure that it is fully opened. Do not climb a closed stepladder. Make sure that both sides of the stepladder  are locked into place. Ask someone to hold it for you, if possible. Clearly mark and make sure that you can see: Any grab bars or handrails. First and last steps. Where the edge of each step is. Use tools that help you move around (mobility aids) if they are needed. These include: Canes. Walkers. Scooters. Crutches. Turn on the lights when you go into a dark area. Replace any light bulbs as soon as they burn out. Set up your furniture so you have a clear path. Avoid moving your furniture around. If any of your floors are uneven, fix them. If there are any pets around you, be aware of where they are. Review your medicines with your doctor. Some medicines can make you feel dizzy. This can increase your chance of falling. Ask your doctor what other things that you can do to help prevent falls. This information is not intended to replace advice given to you by your health care provider. Make sure you discuss any questions you have with your health care provider. Document Released: 07/08/2009 Document Revised: 02/17/2016 Document Reviewed: 10/16/2014 Elsevier Interactive Patient Education  2017 [employer].

## 2022-05-31 NOTE — Progress Notes (Signed)
Subjective:   Eric Corning., Eric [name] is a 71 y.o. male who presents for Medicare Annual/Subsequent preventive examination.  Review of Systems     Cardiac Risk Factors include: advanced age (>93mn, >>64women);diabetes mellitus;dyslipidemia;hypertension;male gender;obesity (BMI >30kg/m2)     Objective:    Today's Vitals   05/31/22 0847  BP: 122/80  Pulse: 88  Temp: 98.4 F (36.9 C)  TempSrc: Oral  SpO2: 96%  Weight: 195 lb (88.5 kg)  Height: _0  (1.702 m)   Body mass index is 30.54 kg/m.     05/31/2022    9:04 AM 04/28/2022    2:06 PM 03/24/2022    8:22 AM 02/17/2022    2:02 PM 01/13/2022   12:42 PM 12/16/2021   12:42 PM 11/04/2021   12:56 PM  Advanced Directives  Does Patient Have a Medical Advance Directive? _1  No No  Does patient want to make changes to medical advance directive?  No - Patient declined No - Patient declined  No - Patient declined No - Patient declined No - Patient declined  Would patient like information on creating a medical advance directive? No - Patient declined  No - Patient declined No - Patient declined   No - Patient declined    Current Medications (verified) Outpatient Encounter Medications as of 05/31/2022  Medication Sig   acetaminophen (TYLENOL) 500 MG tablet Take 1,000 mg by mouth every 4 (four) hours as needed for moderate pain or headache.   amiodarone (PACERONE) 100 MG tablet TAKE 1 TABLET(100 MG) BY MOUTH DAILY   carvedilol (COREG) 6.25 MG tablet TAKE 1 TABLET(6.25 MG) BY MOUTH TWICE DAILY   dapagliflozin propanediol (FARXIGA) 10 MG TABS tablet Take 1 tablet (10 mg total) by mouth daily before breakfast.   ELIQUIS 5 MG TABS tablet TAKE 1 TABLET BY MOUTH TWICE DAILY   famciclovir (FAMVIR) 500 MG tablet Take 1 tablet (500 mg total) by mouth daily.   ferrous sulfate 324 (65 Fe) MG TBEC Take 1 tablet by mouth daily.   fluticasone (FLONASE) 50 MCG/ACT nasal spray Place 2 sprays into both nostrils daily.   furosemide (LASIX) 80 MG  tablet TAKE 1 TABLET(80 MG) BY MOUTH DAILY   Hypromellose (ARTIFICIAL TEARS OP) Place 1 drop into both eyes daily as needed (dry eyes).   Iron-FA-B Cmp-C-Biot-Probiotic (FUSION PLUS) CAPS Take 1 capsule by mouth daily.   levothyroxine (SYNTHROID) 75 MCG tablet TAKE 1 TABLET BY MOUTH DAILY IN THE MORNING. EXCEPT TAKE 1/2 TABLET ON SUNDAYS IN THE MORNING   loperamide (IMODIUM) 2 MG capsule Take 2 mg by mouth as needed for diarrhea or loose stools.   ondansetron (ZOFRAN) 8 MG tablet Take 1 tablet (8 mg total) by mouth 2 (two) times daily as needed for refractory nausea / vomiting. Start on day 3 after Cytoxan.   potassium chloride 20 MEQ/15ML (10%) SOLN TAKE 15 ML BY MOUTH TWICE DAILY   prochlorperazine (COMPAZINE) 10 MG tablet Take 1 tablet (10 mg total) by mouth every 6 (six) hours as needed (Nausea or vomiting).   rosuvastatin (CRESTOR) 20 MG tablet TAKE 1 TABLET(20 MG) BY MOUTH DAILY   vitamin B-12 (CYANOCOBALAMIN) 100 MCG tablet Take 100 mcg by mouth daily.   vitamin C (ASCORBIC ACID) 500 MG tablet Take 500 mg by mouth daily.   VITAMIN D PO Take 1 capsule by mouth daily.   allopurinol (ZYLOPRIM) 100 MG tablet Take 1 tab by mouth every other day (Patient not taking: Reported on 05/31/2022)  isosorbide-hydrALAZINE (BIDIL) 20-37.5 MG tablet TAKE 1 TABLET BY MOUTH THREE TIMES DAILY (Patient not taking: Reported on 05/31/2022)   oxyCODONE-acetaminophen (PERCOCET/ROXICET) 5-325 MG tablet (Schedule II Drug) TK 1 T PO Q 6 H PRN Oral for 15 (Patient not taking: Reported on 05/31/2022)   predniSONE (STERAPRED UNI-PAK 21 TAB) 10 MG (21) TBPK tablet Please dispense as a 6 day dose pack, use as directed (Patient not taking: Reported on 05/31/2022)   No facility-administered encounter medications on file as of 05/31/2022.    Allergies (verified) Lactose intolerance (gi)   History: Past Medical History:  Diagnosis Date   Acute blood loss anemia    Acute encephalopathy    Acute hypoxemic respiratory failure  (HCC)    Acute idiopathic gout of right ankle    Acute lumbar back pain    Acute on chronic systolic heart failure (HCC)    Acute respiratory failure (HCC)    AKI (acute kidney injury) ([place])    Altered mental status    Aortic aneurysm without rupture ([place]) 02/09/2016   Aortic root enlargement ([place]) 02/13/2012   Aortic valve regurgitation, acquired 02/13/2012   Arrhythmia    Atrial flutter ([place]) 03/18/2012   Back pain    Cardiac arrest ([place]) 02/01/2016   CHF (congestive heart failure) ([place])    Chronic anticoagulation 03/04/2013   Chronic kidney disease    kidney fx studies increased    Chronic lower back pain    Chronic renal insufficiency    Chronic systolic heart failure ([place]) 02/13/2012   Recent diagnosis 4 / 2013, LVEF 25% by Echo 12/2011  03/2013: Echo at [facility] Conclusions: 1. Left ventricular ejection fraction estimated by 2D at 40-45 percent. 2. Mild concentric left ventricular hypertrophy. 3. Mild left atrial enlargement. 4. Moderate aortic valve regurgitation. 5. The aortic root at the sinus(es) of valsalva is moderately dilated 6. Mild mitral valve regurgitation. 7.   CKD (chronic kidney disease)    CKD (chronic kidney disease), stage IV ([place]) 08/06/2013   Creatinine 2.4 on 07/04/13    Claustrophobia    Colon cancer screening 03/04/2013   Debility 02/22/2016   Diabetes mellitus type 2 in nonobese [facility])    Diabetes mellitus type 2 in obese [facility])    Dysrhythmia    "palpitations"   Encounter for central line placement    Exertional dyspnea 01/2012   Femoral nerve injury 02/22/2016   Femoral neuropathy    Goals of care, counseling/discussion 05/05/2020   HCAP (healthcare-associated pneumonia)    Heart murmur    Hyperlipidemia 02/13/2012   Hypertension    Hypothyroidism    Internal hemorrhoids without mention of complication 0/34/9179   Labile blood pressure    Left bundle branch block 02/13/2012   Leg weakness, bilateral    Long term (current) use of anticoagulants  08/06/2013   Eliquis therapy    Long term current use of amiodarone 08/07/2016   Lower extremity weakness    Migraine 02/13/12   "opthalmic"   Multiple myeloma ([place]) 05/05/2020   Non-traumatic rhabdomyolysis    Obesity (BMI 30-39.9) 02/13/2012   Pain    Paroxysmal atrial fibrillation (HCC)    Pneumonia    Retroperitoneal bleed    Right ankle pain    Right knee pain    Severe aortic regurgitation 02/09/2016   Special screening for malignant neoplasms, colon 04/11/2013   Thyroid activity decreased    Tibial pain    Varicose vein of leg    right   Ventricular fibrillation ([place]) 02/09/2016  Weakness of both lower extremities    Past Surgical History:  Procedure Laterality Date   AORTIC VALVE REPLACEMENT  11/20/2018   CARDIAC CATHETERIZATION N/A 02/17/2016   Procedure: Left Heart Cath and Coronary Angiography;  Surgeon: Jettie Booze, Eric [name];  Location: [place] CV LAB;  Service: Cardiovascular;  Laterality: N/A;   CARDIOVERSION  03/22/2012   Procedure: CARDIOVERSION;  Surgeon: Candee Furbish, Eric [name];  Location: [facility] ENDOSCOPY;  Service: Cardiovascular;  Laterality: N/A;   CARDIOVERSION  04/19/2012   Procedure: CARDIOVERSION;  Surgeon: Sinclair Grooms, Eric [name];  Location: [place];  Service: Cardiovascular;  Laterality: N/A;   COLONOSCOPY N/A 04/11/2013   Procedure: COLONOSCOPY;  Surgeon: Inda Castle, Eric [name];  Location: [facility] ENDOSCOPY;  Service: Endoscopy;  Laterality: N/A;   COLONOSCOPY N/A 04/11/2013   Procedure: COLONOSCOPY;  Surgeon: Inda Castle, Eric [name];  Location: [facility] ENDOSCOPY;  Service: Endoscopy;  Laterality: N/A;   EP IMPLANTABLE DEVICE N/A 02/17/2016   Procedure: BiV ICD Insertion CRT-D;  Surgeon: Evans Lance, Eric [name];  Location: [place] CV LAB;  Service: Cardiovascular;  Laterality: N/A;   FINGER SURGERY  2012   "4th digit right hand; thumb on left hand"   RADIOLOGY WITH ANESTHESIA N/A 02/11/2016   Procedure: MRI OF THE BRAIN WITHOUT CONTRAST, LUMBAR WITHOUT CONTRAST;  Surgeon: Medication  Radiologist, Eric [name];  Location: [place];  Service: Radiology;  Laterality: N/A;  DR. WOOD/MRI   RIGHT/LEFT HEART CATH AND CORONARY ANGIOGRAPHY N/A 07/26/2018   Procedure: RIGHT/LEFT HEART CATH AND CORONARY ANGIOGRAPHY;  Surgeon: Belva Crome, Eric [name];  Location: [place] CV LAB;  Service: Cardiovascular;  Laterality: N/A;   Skin melanocytoma excision  2012   "above left clavicle"   STERNAL INCISION RECLOSURE  11/2018   STERNAL WIRE REMOVAL  11/2018   STERNAL WOUND DEBRIDEMENT  11/2018   TEE WITHOUT CARDIOVERSION  03/22/2012   Procedure: TRANSESOPHAGEAL ECHOCARDIOGRAM (TEE);  Surgeon: Candee Furbish, Eric [name];  Location: [facility] ENDOSCOPY;  Service: Cardiovascular;  Laterality: N/A;   TEE WITHOUT CARDIOVERSION N/A 02/08/2016   Procedure: TRANSESOPHAGEAL ECHOCARDIOGRAM (TEE);  Surgeon: Lelon Perla, Eric [name];  Location: [facility] ENDOSCOPY;  Service: Cardiovascular;  Laterality: N/A;   Family History  Problem Relation Age of Onset   Hypertension Mother    Heart disease Mother    Heart failure Mother    Diabetes Mother    Hypertension Father    Heart disease Father    Heart failure Father    Social History   Socioeconomic History   Marital status: Married    Spouse name: [name]   Number of children: 1   Years of education: Not on file   Highest education level: Not on file  Occupational History    Employer: [employer]  Tobacco Use   Smoking status: Never   Smokeless tobacco: Never  Vaping Use   Vaping Use: Never used  Substance and Sexual Activity   Alcohol use: Not Currently    Comment: rarely   Drug use: No   Sexual activity: Not Currently  Other Topics Concern   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   Social Determinants of Health   Financial Resource Strain: Low Risk  (05/31/2022)   Overall Financial Resource Strain (CARDIA)    Difficulty of Paying Living Expenses: Not hard at all  Food Insecurity: No Food  Insecurity (05/31/2022)   Hunger Vital Sign    Worried About Running  Out of Food in the Last Year: Never true    Ran Out of Food in the Last Year: Never true  Transportation Needs: No Transportation Needs (05/31/2022)   PRAPARE - Hydrologist (Medical): No    Lack of Transportation (Non-Medical): No  Physical Activity: Inactive (05/31/2022)   Exercise Vital Sign    Days of Exercise per Week: 0 days    Minutes of Exercise per Session: 0 min  Stress: Stress Concern Present (05/31/2022)   [place]    Feeling of Stress : To some extent  Social Connections: Not on file    Tobacco Counseling Counseling given: Not Answered   Clinical Intake:  Pre-visit preparation completed: Yes  Pain : No/denies pain     Nutritional Status: BMI > 30  Obese Nutritional Risks: None Diabetes: Yes  How often do you need to have someone help you when you read instructions, pamphlets, or other written materials from your doctor or pharmacy?: 1 - Never What is the last grade level you completed in school?: Eric [name]  Diabetic? Yes Nutrition Risk Assessment:  Has the patient had any N/V/D within the last 2 months?  No  Does the patient have any non-healing wounds?  No  Has the patient had any unintentional weight loss or weight gain?  No   Diabetes:  Is the patient diabetic?  Yes  If diabetic, was a CBG obtained today?  No  Did the patient bring in their glucometer from home?  No  How often do you monitor your CBG's? Does not.   Financial Strains and Diabetes Management:  Are you having any financial strains with the device, your supplies or your medication? No .  Does the patient want to be seen by Chronic Care Management for management of their diabetes?  No  Would the patient like to be referred to a Nutritionist or for Diabetic Management?  No   Diabetic Exams:  Diabetic Eye Exam: Overdue for diabetic eye  exam. Pt has been advised about the importance in completing this exam. Patient advised to call and schedule an eye exam. Diabetic Foot Exam: Overdue, Pt has been advised about the importance in completing this exam. Pt is scheduled for diabetic foot exam on next appointment.   Interpreter Needed?: No  Information entered by :: NAllen LPN   Activities of Daily Living    05/31/2022    9:05 AM  In your present state of health, do you have any difficulty performing the following activities:  Hearing? 0  Vision? 0  Difficulty concentrating or making decisions? 0  Walking or climbing stairs? 1  Dressing or bathing? 0  Preparing Food and eating ? N  Using the Toilet? N  In the past six months, have you accidently leaked urine? Y  Do you have problems with loss of bowel control? N  Managing your Medications? N  Managing your Finances? N  Housekeeping or managing your Housekeeping? N    Patient Care Team: Glendale Chard, Eric [name] as PCP - General (Internal Medicine) Belva Crome, Eric [name] as PCP - Cardiology (Cardiology) Evans Lance, Eric [name] as PCP - Electrophysiology (Cardiology) Belva Crome, Eric [name] as Consulting Physician (Cardiology) Marin Olp [name], Eric [name] as Medical Oncologist (Oncology)  Indicate any recent Medical Services you may have received from other than [facility] providers in the past year (date may be approximate).     Assessment:   This is a routine wellness examination  for [place].  Hearing/Vision screen Vision Screening - Comments:: No regular eye exams, Dr. Zadie Rhine  Dietary issues and exercise activities discussed: Current Exercise Habits: The patient does not participate in regular exercise at present   Goals Addressed             This Visit's Progress    Patient Stated       05/31/2022, wants to decrease abdominal and rebuild muscle mass       Depression Screen    05/31/2022    9:04 AM 05/25/2021    9:26 AM 04/22/2020    5:07 PM 04/22/2020    4:31 PM 04/09/2019    3:18 PM  10/09/2018    4:14 PM 12/18/2016    4:00 PM  PHQ 2/9 Scores  PHQ - 2 Score 0 0 0 0 0 0 0  PHQ- 9 Score   1  2      Fall Risk    05/31/2022    9:04 AM 05/25/2021    9:26 AM 04/22/2020    5:07 PM 05/13/2019    2:38 PM 04/09/2019    3:18 PM  Fall Risk   Falls in the past year? 0 0 0 0 0  Number falls in past yr: 0    0  Injury with Fall? 0      Risk for fall due to : Medication side effect Medication side effect Medication side effect  Medication side effect  Follow up Falls prevention discussed;Education provided;Falls evaluation completed Falls evaluation completed;Education provided;Falls prevention discussed Falls evaluation completed;Education provided;Falls prevention discussed  Falls evaluation completed;Falls prevention discussed    FALL RISK PREVENTION PERTAINING TO THE HOME:  Any stairs in or around the home? Yes  If so, are there any without handrails? Yes  Home free of loose throw rugs in walkways, pet beds, electrical cords, etc? Yes  Adequate lighting in your home to reduce risk of falls? Yes   ASSISTIVE DEVICES UTILIZED TO PREVENT FALLS:  Life alert? No  Use of a cane, walker or w/c? No  Grab bars in the bathroom? Yes  Shower chair or bench in shower? Yes  Elevated toilet seat or a handicapped toilet? Yes   TIMED UP AND GO:  Was the test performed? Yes .  Length of time to ambulate 10 feet: 5 sec.   Gait steady and fast without use of assistive device  Cognitive Function:        05/31/2022    9:06 AM 05/25/2021    9:28 AM 04/22/2020    5:09 PM 04/09/2019    3:22 PM  6CIT Screen  What Year? 0 points 0 points 0 points 0 points  What month? 0 points 0 points 0 points 0 points  What time? 0 points 0 points 0 points 0 points  Count back from 20 0 points 0 points 0 points 0 points  Months in reverse 0 points 0 points 0 points 2 points  Repeat phrase 0 points 4 points 4 points 0 points  Total Score 0 points 4 points 4 points 2 points     Immunizations Immunization History  Administered Date(s) Administered   Influenza, High Dose Seasonal PF 07/01/2019   Influenza,inj,Quad PF,6+ Mos 06/25/2020   Influenza-Unspecified 04/25/2018   Moderna Sars-Covid-2 Vaccination 08/23/2020   PFIZER(Purple Top)SARS-COV-2 Vaccination 10/09/2019, 10/30/2019    TDAP status: Up to date  Flu Vaccine status: Due, Education has been provided regarding the importance of this vaccine. Advised may receive this vaccine at local pharmacy or  Health Dept. Aware to provide a copy of the vaccination record if obtained from local pharmacy or Health Dept. Verbalized acceptance and understanding.  Pneumococcal vaccine status: Declined,  Education has been provided regarding the importance of this vaccine but patient still declined. Advised may receive this vaccine at local pharmacy or Health Dept. Aware to provide a copy of the vaccination record if obtained from local pharmacy or Health Dept. Verbalized acceptance and understanding.   Covid-19 vaccine status: Completed vaccines  Qualifies for Shingles Vaccine? Yes   Zostavax completed No   Shingrix Completed?: No.    Education has been provided regarding the importance of this vaccine. Patient has been advised to call insurance company to determine out of pocket expense if they have not yet received this vaccine. Advised may also receive vaccine at local pharmacy or Health Dept. Verbalized acceptance and understanding.  Screening Tests Health Maintenance  Topic Date Due   Pneumonia Vaccine 66+ Years old (1 - PCV) Never done   OPHTHALMOLOGY EXAM  Never done   Zoster Vaccines- Shingrix (1 of 2) Never done   COVID-19 Vaccine (4 - Mixed Product risk series) 10/18/2020   HEMOGLOBIN A1C  02/07/2022   FOOT EXAM  03/08/2022   URINE MICROALBUMIN  03/31/2022   INFLUENZA VACCINE  07/23/2028 (Originally 04/25/2022)   COLONOSCOPY (Pts 45-11yr Insurance coverage will need to be confirmed)  04/12/2023    TETANUS/TDAP  02/25/2027   Hepatitis C Screening  Completed   HPV VACCINES  Aged Out    Health Maintenance  Health Maintenance Due  Topic Date Due   Pneumonia Vaccine 70 Years old (1 - PCV) Never done   OPHTHALMOLOGY EXAM  Never done   Zoster Vaccines- Shingrix (1 of 2) Never done   COVID-19 Vaccine (4 - Mixed Product risk series) 10/18/2020   HEMOGLOBIN A1C  02/07/2022   FOOT EXAM  03/08/2022   URINE MICROALBUMIN  03/31/2022    Colorectal cancer screening: Type of screening: Colonoscopy. Completed 04/11/2013. Repeat every 10 years  Lung Cancer Screening: (Low Dose CT Chest recommended if Age 70-80years, 30 pack-year currently smoking OR have quit w/in 15years.) does not qualify.   Lung Cancer Screening Referral: no  Additional Screening:  Hepatitis C Screening: does qualify; Completed 02/24/2012  Vision Screening: Recommended annual ophthalmology exams for early detection of glaucoma and other disorders of the eye. Is the patient up to date with their annual eye exam?  No  Who is the provider or what is the name of the office in which the patient attends annual eye exams? Dr RZadie Rhine If pt is not established with a provider, would they like to be referred to a provider to establish care? No .   Dental Screening: Recommended annual dental exams for proper oral hygiene  Community Resource Referral / Chronic Care Management: CRR required this visit?  No   CCM required this visit?  No      Plan:     I have personally reviewed and noted the following in the patient's chart:   Medical and social history Use of alcohol, tobacco or illicit drugs  Current medications and supplements including opioid prescriptions. Patient is not currently taking opioid prescriptions. Functional ability and status Nutritional status Physical activity Advanced directives List of other physicians Hospitalizations, surgeries, and ER visits in previous 12 months Vitals Screenings to include  cognitive, depression, and falls Referrals and appointments  In addition, I have reviewed and discussed with patient certain preventive protocols, quality metrics, and best practice  recommendations. A written personalized care plan for preventive services as well as general preventive health recommendations were provided to patient.     Kellie Simmering, LPN   0/01/6978   Nurse Notes: none

## 2022-06-01 DIAGNOSIS — E119 Type 2 diabetes mellitus without complications: Secondary | ICD-10-CM | POA: Diagnosis not present

## 2022-06-02 ENCOUNTER — Encounter: Payer: Self-pay | Admitting: Hematology & Oncology

## 2022-06-02 ENCOUNTER — Inpatient Hospital Stay: Payer: Medicare Other | Attending: Family

## 2022-06-02 ENCOUNTER — Inpatient Hospital Stay (HOSPITAL_BASED_OUTPATIENT_CLINIC_OR_DEPARTMENT_OTHER): Payer: Medicare Other | Admitting: Hematology & Oncology

## 2022-06-02 ENCOUNTER — Inpatient Hospital Stay: Payer: Medicare Other

## 2022-06-02 ENCOUNTER — Other Ambulatory Visit: Payer: Self-pay

## 2022-06-02 VITALS — BP 116/76 | HR 70 | Resp 18

## 2022-06-02 VITALS — BP 107/65 | HR 71 | Temp 97.8°F | Resp 18 | Ht 67.0 in | Wt 198.8 lb

## 2022-06-02 DIAGNOSIS — R61 Generalized hyperhidrosis: Secondary | ICD-10-CM

## 2022-06-02 DIAGNOSIS — Z5111 Encounter for antineoplastic chemotherapy: Secondary | ICD-10-CM | POA: Insufficient documentation

## 2022-06-02 DIAGNOSIS — C9000 Multiple myeloma not having achieved remission: Secondary | ICD-10-CM

## 2022-06-02 DIAGNOSIS — Z79899 Other long term (current) drug therapy: Secondary | ICD-10-CM | POA: Insufficient documentation

## 2022-06-02 DIAGNOSIS — M7989 Other specified soft tissue disorders: Secondary | ICD-10-CM | POA: Insufficient documentation

## 2022-06-02 DIAGNOSIS — Z5112 Encounter for antineoplastic immunotherapy: Secondary | ICD-10-CM | POA: Insufficient documentation

## 2022-06-02 LAB — LACTATE DEHYDROGENASE: LDH: 216 U/L — ABNORMAL HIGH (ref 98–192)

## 2022-06-02 LAB — CBC WITH DIFFERENTIAL (CANCER CENTER ONLY)
Abs Immature Granulocytes: 0.02 10*3/uL (ref 0.00–0.07)
Basophils Absolute: 0 10*3/uL (ref 0.0–0.1)
Basophils Relative: 1 %
Eosinophils Absolute: 0.2 10*3/uL (ref 0.0–0.5)
Eosinophils Relative: 3 %
HCT: 38.5 % — ABNORMAL LOW (ref 39.0–52.0)
Hemoglobin: 12.4 g/dL — ABNORMAL LOW (ref 13.0–17.0)
Immature Granulocytes: 0 %
Lymphocytes Relative: 9 %
Lymphs Abs: 0.5 10*3/uL — ABNORMAL LOW (ref 0.7–4.0)
MCH: 30 pg (ref 26.0–34.0)
MCHC: 32.2 g/dL (ref 30.0–36.0)
MCV: 93.2 fL (ref 80.0–100.0)
Monocytes Absolute: 0.9 10*3/uL (ref 0.1–1.0)
Monocytes Relative: 14 %
Neutro Abs: 4.6 10*3/uL (ref 1.7–7.7)
Neutrophils Relative %: 73 %
Platelet Count: 115 10*3/uL — ABNORMAL LOW (ref 150–400)
RBC: 4.13 MIL/uL — ABNORMAL LOW (ref 4.22–5.81)
RDW: 18.4 % — ABNORMAL HIGH (ref 11.5–15.5)
WBC Count: 6.3 10*3/uL (ref 4.0–10.5)
nRBC: 0 % (ref 0.0–0.2)

## 2022-06-02 LAB — CMP (CANCER CENTER ONLY)
ALT: 20 U/L (ref 0–44)
AST: 25 U/L (ref 15–41)
Albumin: 3.6 g/dL (ref 3.5–5.0)
Alkaline Phosphatase: 89 U/L (ref 38–126)
Anion gap: 8 (ref 5–15)
BUN: 23 mg/dL (ref 8–23)
CO2: 25 mmol/L (ref 22–32)
Calcium: 8.5 mg/dL — ABNORMAL LOW (ref 8.9–10.3)
Chloride: 106 mmol/L (ref 98–111)
Creatinine: 1.92 mg/dL — ABNORMAL HIGH (ref 0.61–1.24)
GFR, Estimated: 37 mL/min — ABNORMAL LOW (ref >60)
Glucose, Bld: 170 mg/dL — ABNORMAL HIGH (ref 70–99)
Potassium: 4 mmol/L (ref 3.5–5.1)
Sodium: 139 mmol/L (ref 135–145)
Total Bilirubin: 0.8 mg/dL (ref 0.3–1.2)
Total Protein: 5.6 g/dL — ABNORMAL LOW (ref 6.5–8.1)

## 2022-06-02 LAB — MICROALBUMIN / CREATININE URINE RATIO
Creatinine, Urine: 36.2 mg/dL
Microalb/Creat Ratio: 1872 mg/g creat — ABNORMAL HIGH (ref 0–29)
Microalbumin, Urine: 677.7 ug/mL

## 2022-06-02 LAB — IRON AND IRON BINDING CAPACITY (CC-WL,HP ONLY)
Iron: 40 ug/dL — ABNORMAL LOW (ref 45–182)
Saturation Ratios: 10 % — ABNORMAL LOW (ref 17.9–39.5)
TIBC: 388 ug/dL (ref 250–450)
UIBC: 348 ug/dL (ref 117–376)

## 2022-06-02 LAB — FERRITIN: Ferritin: 35 ng/mL (ref 24–336)

## 2022-06-02 MED ORDER — BORTEZOMIB CHEMO SQ INJECTION 3.5 MG (2.5MG/ML)
1.3000 mg/m2 | Freq: Once | INTRAMUSCULAR | Status: AC
  Administered 2022-06-02: 2.75 mg via SUBCUTANEOUS
  Filled 2022-06-02: qty 1.1

## 2022-06-02 MED ORDER — SODIUM CHLORIDE 0.9 % IV SOLN
20.0000 mg | Freq: Once | INTRAVENOUS | Status: DC
  Filled 2022-06-02: qty 2

## 2022-06-02 MED ORDER — SODIUM CHLORIDE 0.9 % IV SOLN: INTRAVENOUS | Status: DC

## 2022-06-02 MED ORDER — SODIUM CHLORIDE 0.9 % IV SOLN
20.0000 mg | Freq: Once | INTRAVENOUS | Status: DC
  Administered 2022-06-02: 20 mg via INTRAVENOUS
  Filled 2022-06-02: qty 20

## 2022-06-02 MED ORDER — PALONOSETRON HCL INJECTION 0.25 MG/5ML: 0.2500 mg | Freq: Once | INTRAVENOUS | Status: DC

## 2022-06-02 MED ORDER — BORTEZOMIB CHEMO SQ INJECTION 3.5 MG (2.5MG/ML): 1.3000 mg/m2 | Freq: Once | INTRAMUSCULAR | Status: DC

## 2022-06-02 MED ORDER — PALONOSETRON HCL INJECTION 0.25 MG/5ML
0.2500 mg | Freq: Once | INTRAVENOUS | Status: AC
  Administered 2022-06-02: 0.25 mg via INTRAVENOUS

## 2022-06-02 MED ORDER — SODIUM CHLORIDE 0.9 % IV SOLN
400.0000 mg/m2 | Freq: Once | INTRAVENOUS | Status: AC
  Administered 2022-06-02: 820 mg via INTRAVENOUS
  Filled 2022-06-02: qty 41

## 2022-06-02 MED ORDER — SODIUM CHLORIDE 0.9 % IV SOLN: Freq: Once | INTRAVENOUS | Status: DC

## 2022-06-02 MED ORDER — SODIUM CHLORIDE 0.9 % IV SOLN: 400.0000 mg/m2 | Freq: Once | INTRAVENOUS | Status: DC

## 2022-06-02 NOTE — Progress Notes (Signed)
Hematology and Oncology Follow Up Visit  Eric [name]., MD [id number] 01-May-1951 71 y.o. 06/02/2022   Principle Diagnosis:  IgG kappa myeloma  - 1q+   Current Therapy:        CyBorD -- s/p cycle  #14 - started on 06/04/2020   Interim History:  [name] is here today for follow-up and treatment.  He seems to be doing pretty well himself.  He really has had no complaints.  His wife seems to be doing a little bit better.  I think she is in rehab right now.  He is still working.  He works as an Chief Strategy Officer.  He is quite busy.  He is trying to work part-time.  As far as his myeloma is concerned, his last myeloma studies did show a tiny monoclonal spike of 0.1 g/dL.  His he has had no change in bowel or bladder habits.  He has had no nausea or vomiting.  He has had no fever.  He has had no bleeding.  He has had no leg swelling.  He is taking oral iron.  I think this is helping his hemoglobin little bit.  Overall, I would say his performance status is probably ECOG 1.   Medications:  Allergies as of 06/02/2022       Reactions   Lactose Intolerance (gi) Diarrhea        Medication List        Accurate as of June 02, 2022 12:56 PM. If you have any questions, ask your nurse or doctor.          STOP taking these medications    Fusion Plus Caps Stopped by: Volanda Napoleon, MD   oxyCODONE-acetaminophen 5-325 MG tablet Commonly known as: PERCOCET/ROXICET Stopped by: Volanda Napoleon, MD   predniSONE 10 MG (21) Tbpk tablet Commonly known as: STERAPRED UNI-PAK 21 TAB Stopped by: Volanda Napoleon, MD       TAKE these medications    acetaminophen 500 MG tablet Commonly known as: TYLENOL Take 1,000 mg by mouth every 4 (four) hours as needed for moderate pain or headache.   allopurinol 100 MG tablet Commonly known as: ZYLOPRIM Take 1 tab by mouth every other day   amiodarone 100 MG tablet Commonly known as: PACERONE TAKE 1 TABLET(100 MG) BY MOUTH DAILY    ARTIFICIAL TEARS OP Place 1 drop into both eyes daily as needed (dry eyes).   ascorbic acid 500 MG tablet Commonly known as: VITAMIN C Take 500 mg by mouth daily.   carvedilol 6.25 MG tablet Commonly known as: COREG TAKE 1 TABLET(6.25 MG) BY MOUTH TWICE DAILY   dapagliflozin propanediol 10 MG Tabs tablet Commonly known as: Farxiga Take 1 tablet (10 mg total) by mouth daily before breakfast.   Eliquis 5 MG Tabs tablet Generic drug: apixaban TAKE 1 TABLET BY MOUTH TWICE DAILY   famciclovir 500 MG tablet Commonly known as: FAMVIR Take 1 tablet (500 mg total) by mouth daily.   ferrous sulfate 324 (65 Fe) MG Tbec Take 1 tablet by mouth daily.   fluticasone 50 MCG/ACT nasal spray Commonly known as: FLONASE Place 2 sprays into both nostrils daily.   furosemide 80 MG tablet Commonly known as: LASIX TAKE 1 TABLET(80 MG) BY MOUTH DAILY   isosorbide-hydrALAZINE 20-37.5 MG tablet Commonly known as: BIDIL TAKE 1 TABLET BY MOUTH THREE TIMES DAILY   levothyroxine 75 MCG tablet Commonly known as: SYNTHROID TAKE 1 TABLET BY MOUTH DAILY IN THE MORNING. EXCEPT TAKE 1/2 TABLET  ON SUNDAYS IN THE MORNING   loperamide 2 MG capsule Commonly known as: IMODIUM Take 2 mg by mouth as needed for diarrhea or loose stools.   ondansetron 8 MG tablet Commonly known as: Zofran Take 1 tablet (8 mg total) by mouth 2 (two) times daily as needed for refractory nausea / vomiting. Start on day 3 after Cytoxan.   potassium chloride 20 MEQ/15ML (10%) Soln TAKE 15 ML BY MOUTH TWICE DAILY   prochlorperazine 10 MG tablet Commonly known as: COMPAZINE Take 1 tablet (10 mg total) by mouth every 6 (six) hours as needed (Nausea or vomiting).   rosuvastatin 20 MG tablet Commonly known as: CRESTOR TAKE 1 TABLET(20 MG) BY MOUTH DAILY   vitamin B-12 100 MCG tablet Commonly known as: CYANOCOBALAMIN Take 100 mcg by mouth daily.   VITAMIN D PO Take 1 capsule by mouth daily.        Allergies:   Allergies  Allergen Reactions   Lactose Intolerance (Gi) Diarrhea    Past Medical History, Surgical history, Social history, and Family History were reviewed and updated.  Review of Systems: Review of Systems  Constitutional: Negative.   Eyes: Negative.   Respiratory: Negative.    Cardiovascular:  Positive for leg swelling.  Gastrointestinal: Negative.   Genitourinary: Negative.   Musculoskeletal: Negative.   Skin: Negative.   Neurological: Negative.   Endo/Heme/Allergies: Negative.   Psychiatric/Behavioral: Negative.       Physical Exam:  height is '5\' 7"'$  (1.702 m) and weight is 198 lb 12.8 oz (90.2 kg). His oral temperature is 97.8 F (36.6 C). His blood pressure is 107/65 and his pulse is 71. His respiration is 18 and oxygen saturation is 100%.   Wt Readings from Last 3 Encounters:  06/02/22 198 lb 12.8 oz (90.2 kg)  05/31/22 195 lb (88.5 kg)  04/28/22 198 lb (89.8 kg)   His vital signs show temperature of 98.2.  Pulse 61.  Blood pressure 126/88.  Weight is 205 pounds.  Physical Exam Vitals reviewed.  HENT:     Head: Normocephalic and atraumatic.  Eyes:     Pupils: Pupils are equal, round, and reactive to light.  Cardiovascular:     Rate and Rhythm: Normal rate and regular rhythm.     Heart sounds: Normal heart sounds.  Pulmonary:     Effort: Pulmonary effort is normal.     Breath sounds: Normal breath sounds.  Abdominal:     General: Bowel sounds are normal.     Palpations: Abdomen is soft.  Musculoskeletal:        General: No tenderness or deformity. Normal range of motion.     Cervical back: Normal range of motion.  Lymphadenopathy:     Cervical: No cervical adenopathy.  Skin:    General: Skin is warm and dry.     Findings: No erythema or rash.  Neurological:     Mental Status: He is alert and oriented to person, place, and time.  Psychiatric:        Behavior: Behavior normal.        Thought Content: Thought content normal.        Judgment:  Judgment normal.    Lab Results  Component Value Date   WBC 6.3 06/02/2022   HGB 12.4 (L) 06/02/2022   HCT 38.5 (L) 06/02/2022   MCV 93.2 06/02/2022   PLT 115 (L) 06/02/2022   Lab Results  Component Value Date   FERRITIN 32 04/28/2022   IRON 95 04/28/2022  TIBC 391 04/28/2022   UIBC 296 04/28/2022   IRONPCTSAT 24 04/28/2022   Lab Results  Component Value Date   RETICCTPCT 1.3 04/28/2022   RBC 4.13 (L) 06/02/2022   Lab Results  Component Value Date   KPAFRELGTCHN 25.9 (H) 04/28/2022   LAMBDASER 17.3 04/28/2022   KAPLAMBRATIO 1.50 04/28/2022   Lab Results  Component Value Date   IGGSERUM 686 04/28/2022   IGA 105 04/28/2022   IGMSERUM 27 04/28/2022   Lab Results  Component Value Date   TOTALPROTELP 5.2 (L) 04/28/2022   ALBUMINELP 3.1 04/28/2022   A1GS 0.2 04/28/2022   A2GS 0.5 04/28/2022   BETS 0.8 04/28/2022   GAMS 0.5 04/28/2022   MSPIKE 0.1 (H) 04/28/2022   SPEI Comment 04/22/2021     Chemistry      Component Value Date/Time   NA 139 06/02/2022 1151   NA 144 01/04/2022 1143   K 4.0 06/02/2022 1151   CL 106 06/02/2022 1151   CO2 25 06/02/2022 1151   BUN 23 06/02/2022 1151   BUN 30 (H) 01/04/2022 1143   CREATININE 1.92 (H) 06/02/2022 1151   CREATININE 1.95 (H) 09/08/2016 0859      Component Value Date/Time   CALCIUM 8.5 (L) 06/02/2022 1151   ALKPHOS 89 06/02/2022 1151   AST 25 06/02/2022 1151   ALT 20 06/02/2022 1151   BILITOT 0.8 06/02/2022 1151       Impression and Plan: Mr. Sames is a very nice 71 yo African American gentleman with IgG kappa myeloma.  He has done incredibly well with treatment.  I am just happy that he is done well.  His quality of life is doing superbly.  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  We will see what his monoclonal studies show.  Hopefully, we will see if that everything is back to nondetectable.  If we do see a rise in the monoclonal spike, we may have to think  about adding Faspro to his protocol.  I think he comes in every 3 weeks for treatment.  He likes this interval.  We will plan to get him back to see [place] in another 6 weeks.    Volanda Napoleon, MD 9/8/202312:56 PM

## 2022-06-02 NOTE — Patient Instructions (Signed)
[facility]  Discharge Instructions: Thank you for choosing [facility] to provide your oncology and hematology care.   If you have a lab appointment with the [facility], please go directly to the [facility] and check in at the registration area.  Wear comfortable clothing and clothing appropriate for easy access to any Portacath or PICC line.   We strive to give you quality time with your provider. You may need to reschedule your appointment if you arrive late (15 or more minutes).  Arriving late affects you and other patients whose appointments are after yours.  Also, if you miss three or more appointments without notifying the office, you may be dismissed from the clinic at the provider's discretion.      For prescription refill requests, have your pharmacy contact our office and allow 72 hours for refills to be completed.    Today you received the following chemotherapy and/or immunotherapy agents Cytoxan, Velcade.      To help prevent nausea and vomiting after your treatment, we encourage you to take your nausea medication as directed.  BELOW ARE SYMPTOMS THAT SHOULD BE REPORTED IMMEDIATELY: *FEVER GREATER THAN 100.4 F (38 C) OR HIGHER *CHILLS OR SWEATING *NAUSEA AND VOMITING THAT IS NOT CONTROLLED WITH YOUR NAUSEA MEDICATION *UNUSUAL SHORTNESS OF BREATH *UNUSUAL BRUISING OR BLEEDING *URINARY PROBLEMS (pain or burning when urinating, or frequent urination) *BOWEL PROBLEMS (unusual diarrhea, constipation, pain near the anus) TENDERNESS IN MOUTH AND THROAT WITH OR WITHOUT PRESENCE OF ULCERS (sore throat, sores in mouth, or a toothache) UNUSUAL RASH, SWELLING OR PAIN  UNUSUAL VAGINAL DISCHARGE OR ITCHING   Items with * indicate a potential emergency and should be followed up as soon as possible or go to the Emergency Department if any problems should occur.  Please show the CHEMOTHERAPY ALERT CARD or IMMUNOTHERAPY ALERT CARD at check-in  to the Emergency Department and triage nurse. Should you have questions after your visit or need to cancel or reschedule your appointment, please contact [facility]  [phone] and follow the prompts.  Office hours are 8:00 a.m. to 4:30 p.m. Monday - Friday. Please note that voicemails left after 4:00 p.m. may not be returned until the following business day.  We are closed weekends and major holidays. You have access to a nurse at all times for urgent questions. Please call the main number to the clinic [phone] and follow the prompts.  For any non-urgent questions, you may also contact your provider using MyChart. We now offer e-Visits for anyone 7 and older to request care online for non-urgent symptoms. For details visit [email or web address].   Also download the MyChart app! Go to the app store, search "MyChart", open the app, select [facility], and log in with your MyChart username and password.  Masks are optional in the cancer centers. If you would like for your care team to wear a mask while they are taking care of you, please let them know. You may have one support person who is at least 70 years old accompany you for your appointments.

## 2022-06-04 LAB — IGG, IGA, IGM
IgA: 92 mg/dL (ref 61–437)
IgG (Immunoglobin G), Serum: 581 mg/dL — ABNORMAL LOW (ref 603–1613)
IgM (Immunoglobulin M), Srm: 15 mg/dL — ABNORMAL LOW (ref 20–172)

## 2022-06-05 LAB — KAPPA/LAMBDA LIGHT CHAINS
Kappa free light chain: 23.4 mg/L — ABNORMAL HIGH (ref 3.3–19.4)
Kappa, lambda light chain ratio: 1.15 (ref 0.26–1.65)
Lambda free light chains: 20.4 mg/L (ref 5.7–26.3)

## 2022-06-06 LAB — PROTEIN ELECTROPHORESIS, SERUM, WITH REFLEX
A/G Ratio: 1.6 (ref 0.7–1.7)
Albumin ELP: 3.2 g/dL (ref 2.9–4.4)
Alpha-1-Globulin: 0.2 g/dL (ref 0.0–0.4)
Alpha-2-Globulin: 0.5 g/dL (ref 0.4–1.0)
Beta Globulin: 0.8 g/dL (ref 0.7–1.3)
Gamma Globulin: 0.5 g/dL (ref 0.4–1.8)
Globulin, Total: 2 g/dL — ABNORMAL LOW (ref 2.2–3.9)
Total Protein ELP: 5.2 g/dL — ABNORMAL LOW (ref 6.0–8.5)

## 2022-06-13 ENCOUNTER — Ambulatory Visit (INDEPENDENT_AMBULATORY_CARE_PROVIDER_SITE_OTHER): Payer: Medicare Other

## 2022-06-13 DIAGNOSIS — I428 Other cardiomyopathies: Secondary | ICD-10-CM | POA: Diagnosis not present

## 2022-06-13 LAB — CUP PACEART REMOTE DEVICE CHECK
Battery Remaining Longevity: 23 mo
Battery Remaining Percentage: 28 %
Battery Voltage: 2.86 V
Date Time Interrogation Session: 20230919021452
HighPow Impedance: 59 Ohm
HighPow Impedance: 59 Ohm
Implantable Lead Implant Date: 20170525
Implantable Lead Implant Date: 20170525
Implantable Lead Implant Date: 20170525
Implantable Lead Location: 753858
Implantable Lead Location: 753859
Implantable Lead Location: 753860
Implantable Lead Model: 7122
Implantable Pulse Generator Implant Date: 20170525
Lead Channel Impedance Value: 380 Ohm
Lead Channel Impedance Value: 450 Ohm
Lead Channel Impedance Value: 630 Ohm
Lead Channel Pacing Threshold Amplitude: 0.5 V
Lead Channel Pacing Threshold Amplitude: 1.25 V
Lead Channel Pacing Threshold Pulse Width: 0.5 ms
Lead Channel Pacing Threshold Pulse Width: 0.5 ms
Lead Channel Sensing Intrinsic Amplitude: 0.2 mV
Lead Channel Sensing Intrinsic Amplitude: 11.4 mV
Lead Channel Setting Pacing Amplitude: 2 V
Lead Channel Setting Pacing Amplitude: 2.5 V
Lead Channel Setting Pacing Pulse Width: 0.5 ms
Lead Channel Setting Pacing Pulse Width: 0.5 ms
Lead Channel Setting Sensing Sensitivity: 0.5 mV
Pulse Gen Serial Number: 7357926

## 2022-06-19 ENCOUNTER — Ambulatory Visit: Payer: Medicare Other | Attending: Interventional Cardiology | Admitting: Interventional Cardiology

## 2022-06-19 ENCOUNTER — Encounter: Payer: Self-pay | Admitting: Interventional Cardiology

## 2022-06-19 VITALS — BP 122/72 | HR 70 | Ht 67.0 in | Wt 200.8 lb

## 2022-06-19 DIAGNOSIS — Z95810 Presence of automatic (implantable) cardiac defibrillator: Secondary | ICD-10-CM | POA: Diagnosis not present

## 2022-06-19 DIAGNOSIS — I480 Paroxysmal atrial fibrillation: Secondary | ICD-10-CM | POA: Diagnosis not present

## 2022-06-19 DIAGNOSIS — Z952 Presence of prosthetic heart valve: Secondary | ICD-10-CM | POA: Insufficient documentation

## 2022-06-19 DIAGNOSIS — Z7901 Long term (current) use of anticoagulants: Secondary | ICD-10-CM | POA: Insufficient documentation

## 2022-06-19 DIAGNOSIS — I5022 Chronic systolic (congestive) heart failure: Secondary | ICD-10-CM | POA: Diagnosis not present

## 2022-06-19 DIAGNOSIS — I4901 Ventricular fibrillation: Secondary | ICD-10-CM | POA: Insufficient documentation

## 2022-06-19 DIAGNOSIS — N184 Chronic kidney disease, stage 4 (severe): Secondary | ICD-10-CM | POA: Insufficient documentation

## 2022-06-19 NOTE — Patient Instructions (Signed)
Medication Instructions:  Your physician recommends that you continue on your current medications as directed. Please refer to the Current Medication list given to you today.  *If you need a refill on your cardiac medications before your next appointment, please call your pharmacy*   Lab Work: Bmet, BNP  in December with [facility] If you have labs (blood work) drawn today and your tests are completely normal, you will receive your results only by: [facility] (if you have MyChart) OR A paper copy in the mail If you have any lab test that is abnormal or we need to change your treatment, we will call you to review the results.   Testing/Procedures: [facility] in [place] has requested that you have an echocardiogram. Echocardiography is a painless test that uses sound waves to create images of your heart. It provides your doctor with information about the size and shape of your heart and how well your heart's chambers and valves are working. This procedure takes approximately one hour. There are no restrictions for this procedure.    Follow-Up: At [facility], you and your health needs are our priority.  As part of our continuing mission to provide you with exceptional heart care, we have created designated Provider Care Teams.  These Care Teams include your primary Cardiologist (physician) and Advanced Practice Providers (APPs -  Physician Assistants and Nurse Practitioners) who all work together to provide you with the care you need, when you need it.  We recommend signing up for the patient portal called "MyChart".  Sign up information is provided on this After Visit Summary.  MyChart is used to connect with patients for Virtual Visits (Telemedicine).  Patients are able to view lab/test results, encounter notes, upcoming appointments, etc.  Non-urgent messages can be sent to your provider as well.   To learn more about what you can do with MyChart, go to [email or web address].     Your next appointment:   6 month(s)  The format for your next appointment:   In Person  Provider:   Skeet Latch, MD

## 2022-06-19 NOTE — Progress Notes (Signed)
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 Subsequent sternal wound infection requiring sternal flap and prolonged antibiotics.  [name] was last seen in April 2023.  He was doing well at that time.  We did start SGLT2 therapy at that time.  Laboratory data has been stable, demonstrating stage IIIb to stage IV CKD.    Multiple myeloma diagnosed 2021 and has been managed by Dr. Marin Olp with infusion/chemotherapy (CyBorD).  Since that time his wife has been through significant health issues including a right lower extremity below the knee amputation, CVA, and prolonged rehab.  She is still in the facility at [facility] on [address].  He is still working as an Chief Strategy Officer.  He did surgery today.  He is not taking furosemide always I will schedule because of frequent urination that interrupts his workflow.  He is more apt to take it in the evening.  He is also not using BiDil as prescribed and sometimes does not take it at all because it causes him to feel dizzy/near syncopal.  He denies angina,  neurological symptoms, lower extremity edema, orthopnea, PND, and syncope.   He has had some incidental bleeding on Eliquis including subconjunctival hemorrhages and 1 episode of hematuria discussed below under ROS.  Past Medical History:  Diagnosis Date   Acute blood loss anemia    Acute encephalopathy    Acute hypoxemic respiratory failure (HCC)    Acute idiopathic gout of right ankle    Acute lumbar back pain    Acute on chronic systolic heart failure (HCC)    Acute respiratory failure (HCC)    AKI (acute kidney injury) ([place])    Altered mental status    Aortic aneurysm without rupture ([place]) 02/09/2016   Aortic root enlargement ([place]) 02/13/2012   Aortic valve regurgitation, acquired 02/13/2012   Arrhythmia    Atrial flutter ([place]) 03/18/2012   Back pain    Cardiac arrest ([place]) 02/01/2016   CHF (congestive heart failure) ([place])    Chronic anticoagulation 03/04/2013   Chronic kidney disease    kidney fx studies increased    Chronic lower back pain    Chronic renal insufficiency    Chronic systolic heart failure ([place]) 02/13/2012   Recent diagnosis 4 / 2013, LVEF 25% by Echo 12/2011  03/2013: Echo at [facility] Cardiology Conclusions: 1. Left ventricular ejection fraction estimated by 2D at 40-45 percent. 2. Mild concentric left ventricular hypertrophy. 3. Mild left atrial enlargement. 4. Moderate aortic valve regurgitation. 5. The aortic root at the sinus(es) of valsalva is moderately dilated 6. Mild mitral valve regurgitation. 7.   CKD (chronic kidney disease)  CKD (chronic kidney disease), stage IV ([place]) 08/06/2013   Creatinine 2.4 on 07/04/13    Claustrophobia    Colon cancer screening 03/04/2013   Debility 02/22/2016   Diabetes mellitus type 2 in nonobese [facility])    Diabetes mellitus type 2 in obese [facility])    Dysrhythmia    "palpitations"   Encounter for central line placement    Exertional dyspnea 01/2012   Femoral nerve injury 02/22/2016   Femoral neuropathy    Goals of care,  counseling/discussion 05/05/2020   HCAP (healthcare-associated pneumonia)    Heart murmur    Hyperlipidemia 02/13/2012   Hypertension    Hypothyroidism    Internal hemorrhoids without mention of complication 0/27/2536   Labile blood pressure    Left bundle branch block 02/13/2012   Leg weakness, bilateral    Long term (current) use of anticoagulants 08/06/2013   Eliquis therapy    Long term current use of amiodarone 08/07/2016   Lower extremity weakness    Migraine 02/13/12   "opthalmic"   Multiple myeloma ([place]) 05/05/2020   Non-traumatic rhabdomyolysis    Obesity (BMI 30-39.9) 02/13/2012   Pain    Paroxysmal atrial fibrillation (HCC)    Pneumonia    Retroperitoneal bleed    Right ankle pain    Right knee pain    Severe aortic regurgitation 02/09/2016   Special screening for malignant neoplasms, colon 04/11/2013   Thyroid activity decreased    Tibial pain    Varicose vein of leg    right   Ventricular fibrillation ([place]) 02/09/2016   Weakness of both lower extremities     Past Surgical History:  Procedure Laterality Date   AORTIC VALVE REPLACEMENT  11/20/2018   CARDIAC CATHETERIZATION N/A 02/17/2016   Procedure: Left Heart Cath and Coronary Angiography;  Surgeon: Jettie Booze, MD;  Location: [place] CV LAB;  Service: Cardiovascular;  Laterality: N/A;   CARDIOVERSION  03/22/2012   Procedure: CARDIOVERSION;  Surgeon: Candee Furbish, MD;  Location: [facility] ENDOSCOPY;  Service: Cardiovascular;  Laterality: N/A;   CARDIOVERSION  04/19/2012   Procedure: CARDIOVERSION;  Surgeon: Sinclair Grooms, MD;  Location: [place];  Service: Cardiovascular;  Laterality: N/A;   COLONOSCOPY N/A 04/11/2013   Procedure: COLONOSCOPY;  Surgeon: Inda Castle, MD;  Location: [facility] ENDOSCOPY;  Service: Endoscopy;  Laterality: N/A;   COLONOSCOPY N/A 04/11/2013   Procedure: COLONOSCOPY;  Surgeon: Inda Castle, MD;  Location: [facility] ENDOSCOPY;  Service: Endoscopy;  Laterality: N/A;   EP IMPLANTABLE DEVICE N/A  02/17/2016   Procedure: BiV ICD Insertion CRT-D;  Surgeon: Evans Lance, MD;  Location: [place] [facility] LAB;  Service: Cardiovascular;  Laterality: N/A;   FINGER SURGERY  2012   "4th digit right hand; thumb on left hand"   RADIOLOGY WITH ANESTHESIA N/A 02/11/2016   Procedure: MRI OF THE BRAIN WITHOUT CONTRAST, LUMBAR WITHOUT CONTRAST;  Surgeon: Medication Radiologist, MD;  Location: [place];  Service: Radiology;  Laterality: N/A;  DR. WOOD/MRI   RIGHT/LEFT HEART CATH AND CORONARY ANGIOGRAPHY N/A 07/26/2018   Procedure: RIGHT/LEFT HEART CATH AND CORONARY ANGIOGRAPHY;  Surgeon: Belva Crome, MD;  Location: [place] [facility];  Service: Cardiovascular;  Laterality: N/A;   Skin melanocytoma excision  2012   "above left clavicle"   STERNAL INCISION RECLOSURE  11/2018   STERNAL WIRE REMOVAL  11/2018   STERNAL WOUND DEBRIDEMENT  11/2018   TEE WITHOUT CARDIOVERSION  03/22/2012   Procedure: TRANSESOPHAGEAL ECHOCARDIOGRAM (TEE);  Surgeon: Candee Furbish, MD;  Location:  [facility] ENDOSCOPY;  Service: Cardiovascular;  Laterality: N/A;   TEE WITHOUT CARDIOVERSION N/A 02/08/2016   Procedure: TRANSESOPHAGEAL ECHOCARDIOGRAM (TEE);  Surgeon: Lelon Perla, MD;  Location[facility] ENDOSCOPY;  Service: Cardiovascular;  Laterality: N/A;    Current Medications: Current Meds  Medication Sig   acetaminophen (TYLENOL) 500 MG tablet Take 1,000 mg by mouth every 4 (four) hours as needed for moderate pain or headache.   allopurinol (ZYLOPRIM) 100 MG tablet Take 1 tab by mouth every other day   amiodarone (PACERONE) 100 MG tablet TAKE 1 TABLET(100 MG) BY MOUTH DAILY   carvedilol (COREG) 6.25 MG tablet TAKE 1 TABLET(6.25 MG) BY MOUTH TWICE DAILY   dapagliflozin propanediol (FARXIGA) 10 MG TABS tablet Take 1 tablet (10 mg total) by mouth daily before breakfast.   ELIQUIS 5 MG TABS tablet TAKE 1 TABLET BY MOUTH TWICE DAILY   famciclovir (FAMVIR) 500 MG tablet Take 1 tablet (500 mg total) by mouth daily.   ferrous sulfate 324 (65 Fe)  MG TBEC Take 1 tablet by mouth daily.   fluticasone (FLONASE) 50 MCG/ACT nasal spray Place 2 sprays into both nostrils daily.   furosemide (LASIX) 80 MG tablet TAKE 1 TABLET(80 MG) BY MOUTH DAILY   Hypromellose (ARTIFICIAL TEARS OP) Place 1 drop into both eyes daily as needed (dry eyes).   isosorbide-hydrALAZINE (BIDIL) 20-37.5 MG tablet TAKE 1 TABLET BY MOUTH THREE TIMES DAILY   levothyroxine (SYNTHROID) 75 MCG tablet TAKE 1 TABLET BY MOUTH DAILY IN THE MORNING. EXCEPT TAKE 1/2 TABLET ON SUNDAYS IN THE MORNING   loperamide (IMODIUM) 2 MG capsule Take 2 mg by mouth as needed for diarrhea or loose stools.   potassium chloride 20 MEQ/15ML (10%) SOLN TAKE 15 ML BY MOUTH TWICE DAILY   rosuvastatin (CRESTOR) 20 MG tablet TAKE 1 TABLET(20 MG) BY MOUTH DAILY   vitamin B-12 (CYANOCOBALAMIN) 100 MCG tablet Take 100 mcg by mouth daily.   vitamin C (ASCORBIC ACID) 500 MG tablet Take 500 mg by mouth daily.   VITAMIN D PO Take 1 capsule by mouth daily.     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   Social Determinants of Health   Financial Resource Strain: Low Risk  (05/31/2022)   Overall Financial Resource Strain (CARDIA)    Difficulty of Paying Living Expenses: Not hard at all  Food Insecurity: No Food Insecurity (05/31/2022)   Hunger Vital Sign    Worried About Running Out of Food  in the Last Year: Never true    Ran Out of Food in the Last Year: Never true  Transportation Needs: No Transportation Needs (05/31/2022)   PRAPARE - Hydrologist (Medical): No    Lack of Transportation (Non-Medical): No  Physical Activity:  Inactive (05/31/2022)   Exercise Vital Sign    Days of Exercise per Week: 0 days    Minutes of Exercise per Session: 0 min  Stress: Stress Concern Present (05/31/2022)   [place]    Feeling of Stress : To some extent  Social Connections: Not on file     Family History: The patient's family history includes Diabetes in his mother; Heart disease in his father and mother; Heart failure in his father and mother; Hypertension in his father and mother.  ROS:   Please see the history of present illness.    He did have 1 episode of hematuria during the session of chemotherapy for multiple myeloma.  This occurred 4 to 6 months ago and was reported to Dr. Marin Olp.  It has not recurred.  All other systems reviewed and are negative.  EKGs/Labs/Other Studies Reviewed:    The following studies were reviewed today: No recent imaging, and in particular no echocardiogram since 2021 noted below: Please see CareEverywhere form [place]. INTERPRETATION ---------------------------------------------------------------    MODERATE LV DYSFUNCTION (See above) WITH MODERATE LVH    NORMAL RIGHT VENTRICULAR SYSTOLIC FUNCTION    VALVULAR REGURGITATION: MODERATE MR, MODERATE PR, MILD TR    PROSTHETIC VALVE(S): BIOPROSTHETIC AoV    AVR APPEARS STABLE AND WITHOUT REGURGITATION    REDUCED LV EF (40%)    MODERATE PR WITH SEVERELY DILATED MPA (3.8 cm)    PISA MR EROA = 0.1 cm^2, MR RV = 45m    ENLARGED RIGHT HEART WITH PRESERVED RV FUNCTION     3D acquisition and reconstructions were performed as part of this    examination to more accurately quantify the effects of reduced left     ventricular ejection fraction. (post-processing on an Independent    workstation).     Compared with prior Echo study on 04/06/2020: SLIGHTLY IMPROVED LV    FUNCTION     EKG:  EKG from April 2023 demonstrates atrial fibrillation, QRSD 150 ms, left axis deviation, right bundle branch block.  Recent Labs: 08/10/2021: TSH 2.660 06/02/2022: ALT 20; BUN 23; Creatinine 1.92; Hemoglobin 12.4; Platelet Count 115; Potassium 4.0; Sodium 139  Recent Lipid Panel    Component Value Date/Time   CHOL 121 05/13/2019 1549   TRIG 48 05/13/2019 1549   HDL 49 05/13/2019 1549   CHOLHDL 2.5 05/13/2019 1549   CHOLHDL 3.8 02/13/2012 1829   VLDL 30 02/13/2012 1829   LDLCALC 62 05/13/2019 1549    Physical Exam:    VS:  BP 122/72   Pulse 70   Ht '5\' 7"'  (1.702 m)   Wt 200 lb 12.8 oz (91.1 kg)   SpO2 97%   BMI 31.45 kg/m     Wt Readings from Last 3 Encounters:  06/19/22 200 lb 12.8 oz (91.1 kg)  06/02/22 198 lb 12.8 oz (90.2 kg)  05/31/22 195 lb (88.5 kg)     GEN: Overweight with BMI of 32. No acute distress HEENT: Normal NECK: No JVD. LYMPHATICS: No lymphadenopathy CARDIAC: No diastolic or systolic murmur. RRR possible S3 but no S4 gallop, or edema. VASCULAR:  Normal Pulses. No bruits. RESPIRATORY:  Clear to auscultation without rales, wheezing or rhonchi  ABDOMEN: Soft, non-tender, non-distended, No pulsatile mass, MUSCULOSKELETAL: No deformity  SKIN: Warm and dry NEUROLOGIC:  Alert and oriented x 3 PSYCHIATRIC:  Normal affect   ASSESSMENT:    1. S/P AVR (aortic valve replacement)   2. Paroxysmal atrial fibrillation (HCC)  3. Ventricular fibrillation ([place])   4. AICD (automatic cardioverter/defibrillator) present   5. Chronic systolic heart failure ([place])   6. CKD (chronic kidney disease), stage IV ([place])   7. Long term current use of anticoagulant    PLAN:    In order of problems listed above:  2D Doppler echocardiogram to assess aortic bioprosthetic valve function Based upon  clinical exam he is in longstanding permanent atrial fibrillation His wife started CPR when he developed ventricular fibrillation and side of their car in the parking lot of [employer] on [address].  She did CPR until EMS arrived and defibrillated. Cardioverted defibrillator was followed in the device clinic Needs repeat 2D Doppler echocardiogram to follow-up on LV systolic function heart failure regimen includes isosorbide nitrate/hydralazine 20/37.5 mg 3 times daily (uses this medication inconsistently because it causes dizziness), carvedilol 6.25 mg twice daily, Farxiga 10 mg/day, and furosemide for decongestion.  He has been unable to use ARB/ACE/Arni, MRA, due to kidney impairment. Basic metabolic panel and BNP will be done in December at the same time that the 2D Doppler echocardiogram scheduled to be performed. Had a single episode of hematuria greater than 6 months ago.  It was not worked up.  No recurrence of hematuria since that time.  Hematuria occurred at the same time he was receiving an infusion for multiple myeloma.   Guideline directed therapy for left ventricular systolic dysfunction: Angiotensin receptor-neprilysin inhibitor (ARNI)-Entresto; beta-blocker therapy - carvedilol, metoprolol succinate, or bisoprolol; mineralocorticoid receptor antagonist (MRA) therapy -spironolactone or eplerenone.  SGLT-2 agents -  Dapagliflozin [place]) or Empagliflozin ([place]).These therapies have been shown to improve clinical outcomes including reduction of rehospitalization, survival, and acute heart failure.  He has requested Dr. Berneice Gandy as his Cardiologist starting in January.  BMP, Bmet, 2D Doppler echocardiogram will be performed prior to his next office visit but before the end of 2023  Medication Adjustments/Labs and Tests Ordered: Current medicines are reviewed at length with the patient today.  Concerns regarding medicines are outlined above.  Orders Placed This Encounter   Procedures   Pro b natriuretic peptide (BNP)   Basic metabolic panel   ECHOCARDIOGRAM COMPLETE   No orders of the defined types were placed in this encounter.   Patient Instructions  Medication Instructions:  Your physician recommends that you continue on your current medications as directed. Please refer to the Current Medication list given to you today.  *If you need a refill on your cardiac medications before your next appointment, please call your pharmacy*   Lab Work: Bmet, BNP  in December with ECHO If you have labs (blood work) drawn today and your tests are completely normal, you will receive your results only by: [place] (if you have MyChart) OR A paper copy in the mail If you have any lab test that is abnormal or we need to change your treatment, we will call you to review the results.   Testing/Procedures: ECHO in [place] has requested that you have an echocardiogram. Echocardiography is a painless test that uses sound waves to create images of your heart. It provides your doctor with information about the size and shape of your heart and how well your heart's chambers and valves are working. This procedure takes approximately one hour. There are no restrictions for this procedure.    Follow-Up: At [facility], you and your health needs are our priority.  As part of our continuing mission to provide you with exceptional heart care, we have created designated  Provider Care Teams.  These Care Teams include your primary Cardiologist (physician) and Advanced Practice Providers (APPs -  Physician Assistants and Nurse Practitioners) who all work together to provide you with the care you need, when you need it.  We recommend signing up for the patient portal called "MyChart".  Sign up information is provided on this After Visit Summary.  MyChart is used to connect with patients for Virtual Visits (Telemedicine).  Patients are able to view lab/test  results, encounter notes, upcoming appointments, etc.  Non-urgent messages can be sent to your provider as well.   To learn more about what you can do with MyChart, go to [email or web address].    Your next appointment:   6 month(s)  The format for your next appointment:   In Person  Provider:   Skeet Latch, MD              Signed, Sinclair Grooms, MD  06/19/2022 5:42 PM    [place]

## 2022-06-27 NOTE — Progress Notes (Signed)
Remote ICD transmission.   

## 2022-06-28 ENCOUNTER — Ambulatory Visit: Payer: Medicare Other

## 2022-06-28 ENCOUNTER — Inpatient Hospital Stay: Payer: Medicare Other

## 2022-06-28 ENCOUNTER — Ambulatory Visit: Payer: Medicare Other | Admitting: Hematology & Oncology

## 2022-06-30 ENCOUNTER — Inpatient Hospital Stay (HOSPITAL_BASED_OUTPATIENT_CLINIC_OR_DEPARTMENT_OTHER): Payer: Medicare Other | Admitting: Hematology & Oncology

## 2022-06-30 ENCOUNTER — Encounter: Payer: Self-pay | Admitting: Hematology & Oncology

## 2022-06-30 ENCOUNTER — Inpatient Hospital Stay: Payer: Medicare Other

## 2022-06-30 ENCOUNTER — Inpatient Hospital Stay: Payer: Medicare Other | Attending: Family

## 2022-06-30 VITALS — BP 122/85 | HR 87 | Temp 98.2°F | Resp 20 | Ht 69.0 in | Wt 199.0 lb

## 2022-06-30 DIAGNOSIS — C9000 Multiple myeloma not having achieved remission: Secondary | ICD-10-CM

## 2022-06-30 DIAGNOSIS — M7989 Other specified soft tissue disorders: Secondary | ICD-10-CM | POA: Diagnosis not present

## 2022-06-30 DIAGNOSIS — Z5112 Encounter for antineoplastic immunotherapy: Secondary | ICD-10-CM | POA: Diagnosis present

## 2022-06-30 DIAGNOSIS — Z5111 Encounter for antineoplastic chemotherapy: Secondary | ICD-10-CM | POA: Insufficient documentation

## 2022-06-30 DIAGNOSIS — Z79899 Other long term (current) drug therapy: Secondary | ICD-10-CM | POA: Insufficient documentation

## 2022-06-30 LAB — CMP (CANCER CENTER ONLY)
ALT: 21 U/L (ref 0–44)
AST: 27 U/L (ref 15–41)
Albumin: 3.6 g/dL (ref 3.5–5.0)
Alkaline Phosphatase: 105 U/L (ref 38–126)
Anion gap: 10 (ref 5–15)
BUN: 32 mg/dL — ABNORMAL HIGH (ref 8–23)
CO2: 23 mmol/L (ref 22–32)
Calcium: 9 mg/dL (ref 8.9–10.3)
Chloride: 106 mmol/L (ref 98–111)
Creatinine: 1.86 mg/dL — ABNORMAL HIGH (ref 0.61–1.24)
GFR, Estimated: 38 mL/min — ABNORMAL LOW (ref >60)
Glucose, Bld: 185 mg/dL — ABNORMAL HIGH (ref 70–99)
Potassium: 3.4 mmol/L — ABNORMAL LOW (ref 3.5–5.1)
Sodium: 139 mmol/L (ref 135–145)
Total Bilirubin: 0.5 mg/dL (ref 0.3–1.2)
Total Protein: 5.8 g/dL — ABNORMAL LOW (ref 6.5–8.1)

## 2022-06-30 LAB — CBC WITH DIFFERENTIAL (CANCER CENTER ONLY)
Abs Immature Granulocytes: 0.01 10*3/uL (ref 0.00–0.07)
Basophils Absolute: 0 10*3/uL (ref 0.0–0.1)
Basophils Relative: 1 %
Eosinophils Absolute: 0.2 10*3/uL (ref 0.0–0.5)
Eosinophils Relative: 5 %
HCT: 39.1 % (ref 39.0–52.0)
Hemoglobin: 12.7 g/dL — ABNORMAL LOW (ref 13.0–17.0)
Immature Granulocytes: 0 %
Lymphocytes Relative: 12 %
Lymphs Abs: 0.5 10*3/uL — ABNORMAL LOW (ref 0.7–4.0)
MCH: 30.2 pg (ref 26.0–34.0)
MCHC: 32.5 g/dL (ref 30.0–36.0)
MCV: 92.9 fL (ref 80.0–100.0)
Monocytes Absolute: 0.8 10*3/uL (ref 0.1–1.0)
Monocytes Relative: 18 %
Neutro Abs: 2.9 10*3/uL (ref 1.7–7.7)
Neutrophils Relative %: 64 %
Platelet Count: 124 10*3/uL — ABNORMAL LOW (ref 150–400)
RBC: 4.21 MIL/uL — ABNORMAL LOW (ref 4.22–5.81)
RDW: 17.6 % — ABNORMAL HIGH (ref 11.5–15.5)
WBC Count: 4.5 10*3/uL (ref 4.0–10.5)
nRBC: 0 % (ref 0.0–0.2)

## 2022-06-30 LAB — IRON AND IRON BINDING CAPACITY (CC-WL,HP ONLY)
Iron: 29 ug/dL — ABNORMAL LOW (ref 45–182)
Saturation Ratios: 8 % — ABNORMAL LOW (ref 17.9–39.5)
TIBC: 375 ug/dL (ref 250–450)
UIBC: 346 ug/dL (ref 117–376)

## 2022-06-30 LAB — LACTATE DEHYDROGENASE: LDH: 206 U/L — ABNORMAL HIGH (ref 98–192)

## 2022-06-30 LAB — FERRITIN: Ferritin: 37 ng/mL (ref 24–336)

## 2022-06-30 MED ORDER — SODIUM CHLORIDE 0.9 % IV SOLN
400.0000 mg/m2 | Freq: Once | INTRAVENOUS | Status: AC
  Administered 2022-06-30: 820 mg via INTRAVENOUS
  Filled 2022-06-30: qty 41

## 2022-06-30 MED ORDER — SODIUM CHLORIDE 0.9 % IV SOLN: Freq: Once | INTRAVENOUS | Status: AC

## 2022-06-30 MED ORDER — SODIUM CHLORIDE 0.9 % IV SOLN
20.0000 mg | Freq: Once | INTRAVENOUS | Status: AC
  Administered 2022-06-30: 20 mg via INTRAVENOUS
  Filled 2022-06-30: qty 20

## 2022-06-30 MED ORDER — PALONOSETRON HCL INJECTION 0.25 MG/5ML
0.2500 mg | Freq: Once | INTRAVENOUS | Status: AC
  Administered 2022-06-30: 0.25 mg via INTRAVENOUS
  Filled 2022-06-30: qty 5

## 2022-06-30 MED ORDER — BORTEZOMIB CHEMO SQ INJECTION 3.5 MG (2.5MG/ML)
1.3000 mg/m2 | Freq: Once | INTRAMUSCULAR | Status: AC
  Administered 2022-06-30: 2.75 mg via SUBCUTANEOUS
  Filled 2022-06-30: qty 1.1

## 2022-06-30 NOTE — Patient Instructions (Signed)
Cyclophosphamide Injection What is this medication? CYCLOPHOSPHAMIDE (sye kloe FOSS fa mide) treats some types of cancer. It works by slowing down the growth of cancer cells. This medicine may be used for other purposes; ask your health care provider or pharmacist if you have questions. COMMON BRAND NAME(S): Cyclophosphamide, Cytoxan, Neosar What should I tell my care team before I take this medication? They need to know if you have any of these conditions: Heart disease Irregular heartbeat or rhythm Infection Kidney problems Liver disease Low blood cell levels (white cells, platelets, or red blood cells) Lung disease Previous radiation Trouble passing urine An unusual or allergic reaction to cyclophosphamide, other medications, foods, dyes, or preservatives Pregnant or trying to get pregnant Breast-feeding How should I use this medication? This medication is injected into a vein. It is given by your care team in a hospital or clinic setting. Talk to your care team about the use of this medication in children. Special care may be needed. Overdosage: If you think you have taken too much of this medicine contact a poison control center or emergency room at once. NOTE: This medicine is only for you. Do not share this medicine with others. What if I miss a dose? Keep appointments for follow-up doses. It is important not to miss your dose. Call your care team if you are unable to keep an appointment. What may interact with this medication? Amphotericin B Amiodarone Azathioprine Certain antivirals for HIV or hepatitis Certain medications for blood pressure, such as enalapril, lisinopril, quinapril Cyclosporine Diuretics Etanercept Indomethacin Medications that relax muscles Metronidazole Natalizumab Tamoxifen Warfarin This list may not describe all possible interactions. Give your health care provider a list of all the medicines, herbs, non-prescription drugs, or dietary  supplements you use. Also tell them if you smoke, drink alcohol, or use illegal drugs. Some items may interact with your medicine. What should I watch for while using this medication? This medication may make you feel generally unwell. This is not uncommon as chemotherapy can affect healthy cells as well as cancer cells. Report any side effects. Continue your course of treatment even though you feel ill unless your care team tells you to stop. You may need blood work while you are taking this medication. This medication may increase your risk of getting an infection. Call your care team for advice if you get a fever, chills, sore throat, or other symptoms of a cold or flu. Do not treat yourself. Try to avoid being around people who are sick. Avoid taking medications that contain aspirin, acetaminophen, ibuprofen, naproxen, or ketoprofen unless instructed by your care team. These medications may hide a fever. Be careful brushing or flossing your teeth or using a toothpick because you may get an infection or bleed more easily. If you have any dental work done, tell your dentist you are receiving this medication. Drink water or other fluids as directed. Urinate often, even at night. Some products may contain alcohol. Ask your care team if this medication contains alcohol. Be sure to tell all care teams you are taking this medicine. Certain medicines, like metronidazole and disulfiram, can cause an unpleasant reaction when taken with alcohol. The reaction includes flushing, headache, nausea, vomiting, sweating, and increased thirst. The reaction can last from 30 minutes to several hours. Talk to your care team if you wish to become pregnant or think you might be pregnant. This medication can cause serious birth defects if taken during pregnancy and for 1 year after the last dose. A  negative pregnancy test is required before starting this medication. A reliable form of contraception is recommended while taking  this medication and for 1 year after the last dose. Talk to your care team about reliable forms of contraception. Do not father a child while taking this medication and for 4 months after the last dose. Use a condom during this time period. Do not breast-feed while taking this medication or for 1 week after the last dose. This medication may cause infertility. Talk to your care team if you are concerned about your fertility. Talk to your care team about your risk of cancer. You may be more at risk for certain types of cancer if you take this medication. What side effects may I notice from receiving this medication? Side effects that you should report to your care team as soon as possible: Allergic reactions--skin rash, itching, hives, swelling of the face, lips, tongue, or throat Dry cough, shortness of breath or trouble breathing Heart failure--shortness of breath, swelling of the ankles, feet, or hands, sudden weight gain, unusual weakness or fatigue Heart muscle inflammation--unusual weakness or fatigue, shortness of breath, chest pain, fast or irregular heartbeat, dizziness, swelling of the ankles, feet, or hands Heart rhythm changes--fast or irregular heartbeat, dizziness, feeling faint or lightheaded, chest pain, trouble breathing Infection--fever, chills, cough, sore throat, wounds that don't heal, pain or trouble when passing urine, general feeling of discomfort or being unwell Kidney injury--decrease in the amount of urine, swelling of the ankles, hands, or feet Liver injury--right upper belly pain, loss of appetite, nausea, light-colored stool, dark yellow or brown urine, yellowing skin or eyes, unusual weakness or fatigue Low red blood cell level--unusual weakness or fatigue, dizziness, headache, trouble breathing Low sodium level--muscle weakness, fatigue, dizziness, headache, confusion Red or dark brown urine Unusual bruising or bleeding Side effects that usually do not require medical  attention (report to your care team if they continue or are bothersome): Hair loss Irregular menstrual cycles or spotting Loss of appetite Nausea Pain, redness, or swelling with sores inside the mouth or throat Vomiting This list may not describe all possible side effects. Call your doctor for medical advice about side effects. You may report side effects to FDA at 1-800-FDA-1088. Where should I keep my medication? This medication is given in a hospital or clinic. It will not be stored at home. NOTE: This sheet is a summary. It may not cover all possible information. If you have questions about this medicine, talk to your doctor, pharmacist, or health care provider.  2023 Elsevier/Gold Standard (2021-12-21 00:00:00)  Bortezomib Injection What is this medication? BORTEZOMIB (bor TEZ oh mib) treats lymphoma. It may also be used to treat multiple myeloma, a type of bone marrow cancer. It works by blocking a protein that causes cancer cells to grow and multiply. This helps to slow or stop the spread of cancer cells. This medicine may be used for other purposes; ask your health care provider or pharmacist if you have questions. COMMON BRAND NAME(S): Velcade What should I tell my care team before I take this medication? They need to know if you have any of these conditions: Dehydration Diabetes Heart disease Liver disease Tingling of the fingers or toes or other nerve disorder An unusual or allergic reaction to bortezomib, other medications, foods, dyes, or preservatives If you or your partner are pregnant or trying to get pregnant Breastfeeding How should I use this medication? This medication is injected into a vein or under the skin. It is  given by your care team in a hospital or clinic setting. Talk to your care team about the use of this medication in children. Special care may be needed. Overdosage: If you think you have taken too much of this medicine contact a poison control center  or emergency room at once. NOTE: This medicine is only for you. Do not share this medicine with others. What if I miss a dose? Keep appointments for follow-up doses. It is important not to miss your dose. Call your care team if you are unable to keep an appointment. What may interact with this medication? Ketoconazole Rifampin This list may not describe all possible interactions. Give your health care provider a list of all the medicines, herbs, non-prescription drugs, or dietary supplements you use. Also tell them if you smoke, drink alcohol, or use illegal drugs. Some items may interact with your medicine. What should I watch for while using this medication? Your condition will be monitored carefully while you are receiving this medication. You may need blood work while taking this medication. This medication may affect your coordination, reaction time, or judgment. Do not drive or operate machinery until you know how this medication affects you. Sit up or stand slowly to reduce the risk of dizzy or fainting spells. Drinking alcohol with this medication can increase the risk of these side effects. This medication may increase your risk of getting an infection. Call your care team for advice if you get a fever, chills, sore throat, or other symptoms of a cold or flu. Do not treat yourself. Try to avoid being around people who are sick. Check with your care team if you have severe diarrhea, nausea, and vomiting, or if you sweat a lot. The loss of too much body fluid may make it dangerous for you to take this medication. Talk to your care team if you may be pregnant. Serious birth defects can occur if you take this medication during pregnancy and for 7 months after the last dose. You will need a negative pregnancy test before starting this medication. Contraception is recommended while taking this medication and for 7 months after the last dose. Your care team can help you find the option that works for  you. If your partner can get pregnant, use a condom during sex while taking this medication and for 4 months after the last dose. Do not breastfeed while taking this medication and for 2 months after the last dose. This medication may cause infertility. Talk to your care team if you are concerned about your fertility. What side effects may I notice from receiving this medication? Side effects that you should report to your care team as soon as possible: Allergic reactions--skin rash, itching, hives, swelling of the face, lips, tongue, or throat Bleeding--bloody or black, tar-like stools, vomiting blood or brown material that looks like coffee grounds, red or dark brown urine, small red or purple spots on skin, unusual bruising or bleeding Bleeding in the brain--severe headache, stiff neck, confusion, dizziness, change in vision, numbness or weakness of the face, arm, or leg, trouble speaking, trouble walking, vomiting Bowel blockage--stomach cramping, unable to have a bowel movement or pass gas, loss of appetite, vomiting Heart failure--shortness of breath, swelling of the ankles, feet, or hands, sudden weight gain, unusual weakness or fatigue Infection--fever, chills, cough, sore throat, wounds that don't heal, pain or trouble when passing urine, general feeling of discomfort or being unwell Liver injury--right upper belly pain, loss of appetite, nausea, light-colored stool, dark  yellow or brown urine, yellowing skin or eyes, unusual weakness or fatigue Low blood pressure--dizziness, feeling faint or lightheaded, blurry vision Lung injury--shortness of breath or trouble breathing, cough, spitting up blood, chest pain, fever Pain, tingling, or numbness in the hands or feet Severe or prolonged diarrhea Stomach pain, bloody diarrhea, pale skin, unusual weakness or fatigue, decrease in the amount of urine, which may be signs of hemolytic uremic syndrome Sudden and severe headache, confusion, change in  vision, seizures, which may be signs of posterior reversible encephalopathy syndrome (PRES) TTP--purple spots on the skin or inside the mouth, pale skin, yellowing skin or eyes, unusual weakness or fatigue, fever, fast or irregular heartbeat, confusion, change in vision, trouble speaking, trouble walking Tumor lysis syndrome (TLS)--nausea, vomiting, diarrhea, decrease in the amount of urine, dark urine, unusual weakness or fatigue, confusion, muscle pain or cramps, fast or irregular heartbeat, joint pain Side effects that usually do not require medical attention (report to your care team if they continue or are bothersome): Constipation Diarrhea Fatigue Loss of appetite Nausea This list may not describe all possible side effects. Call your doctor for medical advice about side effects. You may report side effects to FDA at 1-800-FDA-1088. Where should I keep my medication? This medication is given in a hospital or clinic. It will not be stored at home. NOTE: This sheet is a summary. It may not cover all possible information. If you have questions about this medicine, talk to your doctor, pharmacist, or health care provider.  2023 Elsevier/Gold Standard (2022-02-08 00:00:00)

## 2022-06-30 NOTE — Progress Notes (Signed)
Ok to treat with Creatinine of 1.86 per Dr Marin Olp

## 2022-06-30 NOTE — Progress Notes (Signed)
Hematology and Oncology Follow Up Visit  Eric Kelemen., MD [id number] November 27, 1950 71 y.o. 06/30/2022   Principle Diagnosis:  IgG kappa myeloma  - 1q+   Current Therapy:        CyBorD -- s/p cycle  #15 - started on 06/04/2020   Interim History:  Eric [name] is here today for follow-up and treatment.  As always, Eric [name] looks great.  Eric [name] is still operating once a week.  Eric [name] is mostly try to help take care of his poor wife.  She is in Rehab right now.  Sound like she will be leaving rehab and coming home in a week or so.  Eric [name] is done incredibly well with his treatments.  Eric [name] tolerated these well.  Eric [name] has had no toxicity from the treatments.  Only last saw him, there is no monoclonal spike in his blood.  His IgG level was 581 mg/dL.  The Kappa light chain was 2.3 mg/dL.  Eric [name] has had no cough.  There is no shortness of breath.  Eric [name] has had no change in bowel or bladder habits.  Eric [name] has had no bleeding.  Eric [name] has had no rashes.  Eric [name] is on oral iron right now.  We want oral iron because his iron saturation was only 10% back in early September.  This is really helped his iron levels.  This is helped his hemoglobin.    Overall, I would say his performance status is probably ECOG 1.    Medications:  Allergies as of 06/30/2022       Reactions   Lactose Intolerance (gi) Diarrhea        Medication List        Accurate as of June 30, 2022  9:34 AM. If you have any questions, ask your nurse or doctor.          acetaminophen 500 MG tablet Commonly known as: TYLENOL Take 1,000 mg by mouth every 4 (four) hours as needed for moderate pain or headache.   allopurinol 100 MG tablet Commonly known as: ZYLOPRIM Take 1 tab by mouth every other day   amiodarone 100 MG tablet Commonly known as: PACERONE TAKE 1 TABLET(100 MG) BY MOUTH DAILY   ARTIFICIAL TEARS OP Place 1 drop into both eyes daily as needed (dry eyes).   ascorbic acid 500 MG tablet Commonly known as: VITAMIN C Take 500 mg by  mouth daily.   carvedilol 6.25 MG tablet Commonly known as: COREG TAKE 1 TABLET(6.25 MG) BY MOUTH TWICE DAILY   dapagliflozin propanediol 10 MG Tabs tablet Commonly known as: Farxiga Take 1 tablet (10 mg total) by mouth daily before breakfast.   Eliquis 5 MG Tabs tablet Generic drug: apixaban TAKE 1 TABLET BY MOUTH TWICE DAILY   famciclovir 500 MG tablet Commonly known as: FAMVIR Take 1 tablet (500 mg total) by mouth daily.   ferrous sulfate 324 (65 Fe) MG Tbec Take 1 tablet by mouth daily.   fluticasone 50 MCG/ACT nasal spray Commonly known as: FLONASE Place 2 sprays into both nostrils daily.   furosemide 80 MG tablet Commonly known as: LASIX TAKE 1 TABLET(80 MG) BY MOUTH DAILY   isosorbide-hydrALAZINE 20-37.5 MG tablet Commonly known as: BIDIL TAKE 1 TABLET BY MOUTH THREE TIMES DAILY   levothyroxine 75 MCG tablet Commonly known as: SYNTHROID TAKE 1 TABLET BY MOUTH DAILY IN THE MORNING. EXCEPT TAKE 1/2 TABLET ON SUNDAYS IN THE MORNING   loperamide 2 MG capsule Commonly known as: IMODIUM Take 2 mg by mouth as  needed for diarrhea or loose stools.   potassium chloride 20 MEQ/15ML (10%) Soln TAKE 15 ML BY MOUTH TWICE DAILY What changed: See the new instructions.   rosuvastatin 20 MG tablet Commonly known as: CRESTOR TAKE 1 TABLET(20 MG) BY MOUTH DAILY   vitamin B-12 100 MCG tablet Commonly known as: CYANOCOBALAMIN Take 100 mcg by mouth daily.   VITAMIN D PO Take 1 capsule by mouth daily.        Allergies:  Allergies  Allergen Reactions   Lactose Intolerance (Gi) Diarrhea    Past Medical History, Surgical history, Social history, and Family History were reviewed and updated.  Review of Systems: Review of Systems  Constitutional: Negative.   Eyes: Negative.   Respiratory: Negative.    Cardiovascular:  Positive for leg swelling.  Gastrointestinal: Negative.   Genitourinary: Negative.   Musculoskeletal: Negative.   Skin: Negative.   Neurological:  Negative.   Endo/Heme/Allergies: Negative.   Psychiatric/Behavioral: Negative.       Physical Exam:  height is '5\' 9"'$  (1.753 m) and weight is 199 lb (90.3 kg). His oral temperature is 98.2 F (36.8 C). His blood pressure is 122/85 and his pulse is 87. His respiration is 20 and oxygen saturation is 100%.   Wt Readings from Last 3 Encounters:  06/30/22 199 lb (90.3 kg)  06/19/22 200 lb 12.8 oz (91.1 kg)  06/02/22 198 lb 12.8 oz (90.2 kg)   His vital signs show temperature of 98.2.  Pulse 61.  Blood pressure 126/88.  Weight is 205 pounds.  Physical Exam Vitals reviewed.  HENT:     Head: Normocephalic and atraumatic.  Eyes:     Pupils: Pupils are equal, round, and reactive to light.  Cardiovascular:     Rate and Rhythm: Normal rate and regular rhythm.     Heart sounds: Normal heart sounds.  Pulmonary:     Effort: Pulmonary effort is normal.     Breath sounds: Normal breath sounds.  Abdominal:     General: Bowel sounds are normal.     Palpations: Abdomen is soft.  Musculoskeletal:        General: No tenderness or deformity. Normal range of motion.     Cervical back: Normal range of motion.  Lymphadenopathy:     Cervical: No cervical adenopathy.  Skin:    General: Skin is warm and dry.     Findings: No erythema or rash.  Neurological:     Mental Status: Eric [name] is alert and oriented to person, place, and time.  Psychiatric:        Behavior: Behavior normal.        Thought Content: Thought content normal.        Judgment: Judgment normal.     Lab Results  Component Value Date   WBC 4.5 06/30/2022   HGB 12.7 (L) 06/30/2022   HCT 39.1 06/30/2022   MCV 92.9 06/30/2022   PLT 124 (L) 06/30/2022   Lab Results  Component Value Date   FERRITIN 35 06/02/2022   IRON 40 (L) 06/02/2022   TIBC 388 06/02/2022   UIBC 348 06/02/2022   IRONPCTSAT 10 (L) 06/02/2022   Lab Results  Component Value Date   RETICCTPCT 1.3 04/28/2022   RBC 4.21 (L) 06/30/2022   Lab Results   Component Value Date   KPAFRELGTCHN 23.4 (H) 06/02/2022   LAMBDASER 20.4 06/02/2022   KAPLAMBRATIO 1.15 06/02/2022   Lab Results  Component Value Date   IGGSERUM 581 (L) 06/02/2022   IGA 92 06/02/2022  IGMSERUM 15 (L) 06/02/2022   Lab Results  Component Value Date   TOTALPROTELP 5.2 (L) 06/02/2022   ALBUMINELP 3.2 06/02/2022   A1GS 0.2 06/02/2022   A2GS 0.5 06/02/2022   BETS 0.8 06/02/2022   GAMS 0.5 06/02/2022   MSPIKE Not Observed 06/02/2022   SPEI Comment 04/22/2021     Chemistry      Component Value Date/Time   NA 139 06/30/2022 0844   NA 144 01/04/2022 1143   K 3.4 (L) 06/30/2022 0844   CL 106 06/30/2022 0844   CO2 23 06/30/2022 0844   BUN 32 (H) 06/30/2022 0844   BUN 30 (H) 01/04/2022 1143   CREATININE 1.86 (H) 06/30/2022 0844   CREATININE 1.95 (H) 09/08/2016 0859      Component Value Date/Time   CALCIUM 9.0 06/30/2022 0844   ALKPHOS 105 06/30/2022 0844   AST 27 06/30/2022 0844   ALT 21 06/30/2022 0844   BILITOT 0.5 06/30/2022 0844       Impression and Plan: Eric [name] is a very nice 71 yo African American gentleman with IgG kappa myeloma.  Eric [name] has done incredibly well with treatment.  I am just happy that Eric [name] is done well.  His quality of life is doing superbly.  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  Eric [name] is able to take care of his poor wife.  I would not think that his myeloma is really all that active right now.  We will see what his monoclonal studies show.  I do not see that we had to make any changes with his treatment.  I think that if his monoclonal studies continue to stabilize, then we might be able to move his appointments out to monthly.  Again, his quality of life is fantastic.  Now that his wife is about to come home, Eric [name] wants to be able to help her.  I will plan to see him back in another 6 weeks.  Eric [name] will come in 3 weeks for treatment.   Volanda Napoleon, MD 10/6/20239:34 AM

## 2022-07-01 LAB — IGG, IGA, IGM
IgA: 85 mg/dL (ref 61–437)
IgG (Immunoglobin G), Serum: 508 mg/dL — ABNORMAL LOW (ref 603–1613)
IgM (Immunoglobulin M), Srm: 12 mg/dL — ABNORMAL LOW (ref 20–172)

## 2022-07-03 ENCOUNTER — Encounter: Payer: Self-pay | Admitting: *Deleted

## 2022-07-03 LAB — KAPPA/LAMBDA LIGHT CHAINS
Kappa free light chain: 20.2 mg/L — ABNORMAL HIGH (ref 3.3–19.4)
Kappa, lambda light chain ratio: 1.15 (ref 0.26–1.65)
Lambda free light chains: 17.5 mg/L (ref 5.7–26.3)

## 2022-07-05 LAB — PROTEIN ELECTROPHORESIS, SERUM, WITH REFLEX
A/G Ratio: 1.3 (ref 0.7–1.7)
Albumin ELP: 2.9 g/dL (ref 2.9–4.4)
Alpha-1-Globulin: 0.2 g/dL (ref 0.0–0.4)
Alpha-2-Globulin: 0.6 g/dL (ref 0.4–1.0)
Beta Globulin: 0.9 g/dL (ref 0.7–1.3)
Gamma Globulin: 0.5 g/dL (ref 0.4–1.8)
Globulin, Total: 2.2 g/dL (ref 2.2–3.9)
Total Protein ELP: 5.1 g/dL — ABNORMAL LOW (ref 6.0–8.5)

## 2022-07-28 ENCOUNTER — Inpatient Hospital Stay: Payer: Medicare Other

## 2022-07-28 ENCOUNTER — Inpatient Hospital Stay: Payer: Medicare Other | Attending: Family

## 2022-07-28 ENCOUNTER — Ambulatory Visit (INDEPENDENT_AMBULATORY_CARE_PROVIDER_SITE_OTHER): Payer: Medicare Other | Admitting: Podiatry

## 2022-07-28 ENCOUNTER — Encounter: Payer: Self-pay | Admitting: Podiatry

## 2022-07-28 VITALS — BP 120/80 | HR 69 | Temp 97.8°F | Resp 20

## 2022-07-28 DIAGNOSIS — L608 Other nail disorders: Secondary | ICD-10-CM | POA: Diagnosis not present

## 2022-07-28 DIAGNOSIS — M79674 Pain in right toe(s): Secondary | ICD-10-CM

## 2022-07-28 DIAGNOSIS — M79675 Pain in left toe(s): Secondary | ICD-10-CM | POA: Diagnosis not present

## 2022-07-28 DIAGNOSIS — Z5111 Encounter for antineoplastic chemotherapy: Secondary | ICD-10-CM | POA: Insufficient documentation

## 2022-07-28 DIAGNOSIS — M7989 Other specified soft tissue disorders: Secondary | ICD-10-CM | POA: Diagnosis not present

## 2022-07-28 DIAGNOSIS — Z992 Dependence on renal dialysis: Secondary | ICD-10-CM | POA: Insufficient documentation

## 2022-07-28 DIAGNOSIS — B351 Tinea unguium: Secondary | ICD-10-CM

## 2022-07-28 DIAGNOSIS — C9000 Multiple myeloma not having achieved remission: Secondary | ICD-10-CM | POA: Diagnosis present

## 2022-07-28 DIAGNOSIS — Z5112 Encounter for antineoplastic immunotherapy: Secondary | ICD-10-CM | POA: Diagnosis present

## 2022-07-28 DIAGNOSIS — E119 Type 2 diabetes mellitus without complications: Secondary | ICD-10-CM | POA: Diagnosis not present

## 2022-07-28 DIAGNOSIS — D689 Coagulation defect, unspecified: Secondary | ICD-10-CM | POA: Diagnosis not present

## 2022-07-28 LAB — CBC WITH DIFFERENTIAL (CANCER CENTER ONLY)
Abs Immature Granulocytes: 0.02 10*3/uL (ref 0.00–0.07)
Basophils Absolute: 0 10*3/uL (ref 0.0–0.1)
Basophils Relative: 0 %
Eosinophils Absolute: 0.2 10*3/uL (ref 0.0–0.5)
Eosinophils Relative: 5 %
HCT: 38.8 % — ABNORMAL LOW (ref 39.0–52.0)
Hemoglobin: 12.4 g/dL — ABNORMAL LOW (ref 13.0–17.0)
Immature Granulocytes: 0 %
Lymphocytes Relative: 10 %
Lymphs Abs: 0.5 10*3/uL — ABNORMAL LOW (ref 0.7–4.0)
MCH: 30.7 pg (ref 26.0–34.0)
MCHC: 32 g/dL (ref 30.0–36.0)
MCV: 96 fL (ref 80.0–100.0)
Monocytes Absolute: 0.8 10*3/uL (ref 0.1–1.0)
Monocytes Relative: 17 %
Neutro Abs: 3.4 10*3/uL (ref 1.7–7.7)
Neutrophils Relative %: 68 %
Platelet Count: 134 10*3/uL — ABNORMAL LOW (ref 150–400)
RBC: 4.04 MIL/uL — ABNORMAL LOW (ref 4.22–5.81)
RDW: 17.2 % — ABNORMAL HIGH (ref 11.5–15.5)
WBC Count: 5 10*3/uL (ref 4.0–10.5)
nRBC: 0 % (ref 0.0–0.2)

## 2022-07-28 LAB — CMP (CANCER CENTER ONLY)
ALT: 27 U/L (ref 0–44)
AST: 31 U/L (ref 15–41)
Albumin: 3.7 g/dL (ref 3.5–5.0)
Alkaline Phosphatase: 102 U/L (ref 38–126)
Anion gap: 6 (ref 5–15)
BUN: 32 mg/dL — ABNORMAL HIGH (ref 8–23)
CO2: 27 mmol/L (ref 22–32)
Calcium: 9 mg/dL (ref 8.9–10.3)
Chloride: 107 mmol/L (ref 98–111)
Creatinine: 1.98 mg/dL — ABNORMAL HIGH (ref 0.61–1.24)
GFR, Estimated: 36 mL/min — ABNORMAL LOW (ref >60)
Glucose, Bld: 164 mg/dL — ABNORMAL HIGH (ref 70–99)
Potassium: 4 mmol/L (ref 3.5–5.1)
Sodium: 140 mmol/L (ref 135–145)
Total Bilirubin: 0.7 mg/dL (ref 0.3–1.2)
Total Protein: 5.8 g/dL — ABNORMAL LOW (ref 6.5–8.1)

## 2022-07-28 MED ORDER — SODIUM CHLORIDE 0.9 % IV SOLN
400.0000 mg/m2 | Freq: Once | INTRAVENOUS | Status: AC
  Administered 2022-07-28: 820 mg via INTRAVENOUS
  Filled 2022-07-28: qty 25

## 2022-07-28 MED ORDER — BORTEZOMIB CHEMO SQ INJECTION 3.5 MG (2.5MG/ML)
1.3000 mg/m2 | Freq: Once | INTRAMUSCULAR | Status: AC
  Administered 2022-07-28: 2.75 mg via SUBCUTANEOUS
  Filled 2022-07-28: qty 1.1

## 2022-07-28 MED ORDER — SODIUM CHLORIDE 0.9 % IV SOLN: Freq: Once | INTRAVENOUS | Status: AC

## 2022-07-28 MED ORDER — SODIUM CHLORIDE 0.9 % IV SOLN
20.0000 mg | Freq: Once | INTRAVENOUS | Status: AC
  Administered 2022-07-28: 20 mg via INTRAVENOUS
  Filled 2022-07-28: qty 20

## 2022-07-28 MED ORDER — PALONOSETRON HCL INJECTION 0.25 MG/5ML
0.2500 mg | Freq: Once | INTRAVENOUS | Status: AC
  Administered 2022-07-28: 0.25 mg via INTRAVENOUS
  Filled 2022-07-28: qty 5

## 2022-07-28 NOTE — Progress Notes (Signed)
This patient returns to my office for at risk foot care.  This patient requires this care by a professional since this patient will be at risk due to having diabetes type 2, chronic kidney disease and coagulation defect.  Patient is taking eliquisThis patient is unable to cut nails himself since the patient cannot reach his nails.These nails are painful walking and wearing shoes.  This patient presents for at risk foot care today.  General Appearance  Alert, conversant and in no acute stress.  Vascular  Dorsalis pedis and posterior tibial  pulses are palpable  bilaterally.  Capillary return is within normal limits  bilaterally. Temperature is within normal limits  bilaterally.  Neurologic  Senn-Weinstein monofilament wire test within normal limits  bilaterally. Muscle power within normal limits bilaterally.  Nails Thick disfigured discolored nails with subungual debris  from hallux to fifth toes bilaterally. No evidence of bacterial infection or drainage bilaterally.  Orthopedic  No limitations of motion  feet .  No crepitus or effusions noted.  No bony pathology or digital deformities noted.  Skin  normotropic skin with no porokeratosis noted bilaterally.  No signs of infections or ulcers noted.     Onychomycosis  Pain in right toes  Pain in left toes  Consent was obtained for treatment procedures.   Mechanical debridement of nails 1-5  bilaterally performed with a nail nipper.  Filed with dremel without incident.    Return office visit   10 weeks                   Told patient to return for periodic foot care and evaluation due to potential at risk complications.   Gardiner Barefoot DPM

## 2022-07-28 NOTE — Progress Notes (Signed)
Okay to treat with SCr 1.98 per Dr. Marin Olp.

## 2022-07-28 NOTE — Patient Instructions (Signed)
[facility]  Discharge Instructions: Thank you for choosing [facility] to provide your oncology and hematology care.   If you have a lab appointment with the [facility], please go directly to the [facility] and check in at the registration area.  Wear comfortable clothing and clothing appropriate for easy access to any Portacath or PICC line.   We strive to give you quality time with your provider. You may need to reschedule your appointment if you arrive late (15 or more minutes).  Arriving late affects you and other patients whose appointments are after yours.  Also, if you miss three or more appointments without notifying the office, you may be dismissed from the clinic at the provider's discretion.      For prescription refill requests, have your pharmacy contact our office and allow 72 hours for refills to be completed.    Today you received the following chemotherapy and/or immunotherapy agents velcade, cytoxan      To help prevent nausea and vomiting after your treatment, we encourage you to take your nausea medication as directed.  BELOW ARE SYMPTOMS THAT SHOULD BE REPORTED IMMEDIATELY: *FEVER GREATER THAN 100.4 F (38 C) OR HIGHER *CHILLS OR SWEATING *NAUSEA AND VOMITING THAT IS NOT CONTROLLED WITH YOUR NAUSEA MEDICATION *UNUSUAL SHORTNESS OF BREATH *UNUSUAL BRUISING OR BLEEDING *URINARY PROBLEMS (pain or burning when urinating, or frequent urination) *BOWEL PROBLEMS (unusual diarrhea, constipation, pain near the anus) TENDERNESS IN MOUTH AND THROAT WITH OR WITHOUT PRESENCE OF ULCERS (sore throat, sores in mouth, or a toothache) UNUSUAL RASH, SWELLING OR PAIN  UNUSUAL VAGINAL DISCHARGE OR ITCHING   Items with * indicate a potential emergency and should be followed up as soon as possible or go to the Emergency Department if any problems should occur.  Please show the CHEMOTHERAPY ALERT CARD or IMMUNOTHERAPY ALERT CARD at check-in  to the Emergency Department and triage nurse. Should you have questions after your visit or need to cancel or reschedule your appointment, please contact [facility]  [phone] and follow the prompts.  Office hours are 8:00 a.m. to 4:30 p.m. Monday - Friday. Please note that voicemails left after 4:00 p.m. may not be returned until the following business day.  We are closed weekends and major holidays. You have access to a nurse at all times for urgent questions. Please call the main number to the clinic [phone] and follow the prompts.  For any non-urgent questions, you may also contact your provider using MyChart. We now offer e-Visits for anyone 58 and older to request care online for non-urgent symptoms. For details visit [email or web address].   Also download the MyChart app! Go to the app store, search "MyChart", open the app, select [facility], and log in with your MyChart username and password.  Masks are optional in the cancer centers. If you would like for your care team to wear a mask while they are taking care of you, please let them know. You may have one support person who is at least 70 years old accompany you for your appointments.

## 2022-08-07 ENCOUNTER — Telehealth: Payer: Self-pay | Admitting: *Deleted

## 2022-08-07 NOTE — Telephone Encounter (Signed)
error 

## 2022-08-11 ENCOUNTER — Inpatient Hospital Stay: Payer: Medicare Other | Admitting: Hematology & Oncology

## 2022-08-11 ENCOUNTER — Inpatient Hospital Stay: Payer: Medicare Other

## 2022-08-16 ENCOUNTER — Inpatient Hospital Stay (HOSPITAL_BASED_OUTPATIENT_CLINIC_OR_DEPARTMENT_OTHER): Payer: Medicare Other | Admitting: Hematology & Oncology

## 2022-08-16 ENCOUNTER — Inpatient Hospital Stay: Payer: Medicare Other

## 2022-08-16 ENCOUNTER — Other Ambulatory Visit: Payer: Self-pay

## 2022-08-16 ENCOUNTER — Encounter: Payer: Self-pay | Admitting: Hematology & Oncology

## 2022-08-16 VITALS — BP 120/85 | HR 81 | Temp 97.5°F | Resp 18 | Ht 69.0 in | Wt 199.0 lb

## 2022-08-16 DIAGNOSIS — C9000 Multiple myeloma not having achieved remission: Secondary | ICD-10-CM

## 2022-08-16 DIAGNOSIS — M7989 Other specified soft tissue disorders: Secondary | ICD-10-CM | POA: Diagnosis not present

## 2022-08-16 DIAGNOSIS — Z992 Dependence on renal dialysis: Secondary | ICD-10-CM | POA: Diagnosis not present

## 2022-08-16 DIAGNOSIS — Z5112 Encounter for antineoplastic immunotherapy: Secondary | ICD-10-CM | POA: Diagnosis not present

## 2022-08-16 DIAGNOSIS — Z5111 Encounter for antineoplastic chemotherapy: Secondary | ICD-10-CM | POA: Diagnosis not present

## 2022-08-16 LAB — CBC WITH DIFFERENTIAL (CANCER CENTER ONLY)
Abs Immature Granulocytes: 0.01 10*3/uL (ref 0.00–0.07)
Basophils Absolute: 0 10*3/uL (ref 0.0–0.1)
Basophils Relative: 1 %
Eosinophils Absolute: 0.2 10*3/uL (ref 0.0–0.5)
Eosinophils Relative: 4 %
HCT: 40.4 % (ref 39.0–52.0)
Hemoglobin: 12.9 g/dL — ABNORMAL LOW (ref 13.0–17.0)
Immature Granulocytes: 0 %
Lymphocytes Relative: 12 %
Lymphs Abs: 0.6 10*3/uL — ABNORMAL LOW (ref 0.7–4.0)
MCH: 30.8 pg (ref 26.0–34.0)
MCHC: 31.9 g/dL (ref 30.0–36.0)
MCV: 96.4 fL (ref 80.0–100.0)
Monocytes Absolute: 1 10*3/uL (ref 0.1–1.0)
Monocytes Relative: 21 %
Neutro Abs: 2.8 10*3/uL (ref 1.7–7.7)
Neutrophils Relative %: 62 %
Platelet Count: 125 10*3/uL — ABNORMAL LOW (ref 150–400)
RBC: 4.19 MIL/uL — ABNORMAL LOW (ref 4.22–5.81)
RDW: 17 % — ABNORMAL HIGH (ref 11.5–15.5)
WBC Count: 4.5 10*3/uL (ref 4.0–10.5)
nRBC: 0 % (ref 0.0–0.2)

## 2022-08-16 LAB — CMP (CANCER CENTER ONLY)
ALT: 21 U/L (ref 0–44)
AST: 24 U/L (ref 15–41)
Albumin: 4 g/dL (ref 3.5–5.0)
Alkaline Phosphatase: 84 U/L (ref 38–126)
Anion gap: 9 (ref 5–15)
BUN: 37 mg/dL — ABNORMAL HIGH (ref 8–23)
CO2: 27 mmol/L (ref 22–32)
Calcium: 8.9 mg/dL (ref 8.9–10.3)
Chloride: 106 mmol/L (ref 98–111)
Creatinine: 1.93 mg/dL — ABNORMAL HIGH (ref 0.61–1.24)
GFR, Estimated: 37 mL/min — ABNORMAL LOW (ref >60)
Glucose, Bld: 115 mg/dL — ABNORMAL HIGH (ref 70–99)
Potassium: 3.5 mmol/L (ref 3.5–5.1)
Sodium: 142 mmol/L (ref 135–145)
Total Bilirubin: 1.1 mg/dL (ref 0.3–1.2)
Total Protein: 6 g/dL — ABNORMAL LOW (ref 6.5–8.1)

## 2022-08-16 LAB — LACTATE DEHYDROGENASE: LDH: 230 U/L — ABNORMAL HIGH (ref 98–192)

## 2022-08-16 LAB — FERRITIN: Ferritin: 49 ng/mL (ref 24–336)

## 2022-08-16 NOTE — Progress Notes (Signed)
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 08/16/2022   Principle Diagnosis:  IgG kappa myeloma  - 1q+   Current Therapy:        CyBorD -- s/p cycle  #17 - started on 06/04/2020   Interim History:  Eric [name] is here today for follow-up.  As always, he has been busy.  He is an ophthalmologist in town.  Despite working part-time, he is quite busy.  His wife is now home.  She has really had a tough time.  She is on dialysis.  However, she is now home which is making life better for him.  When we last saw him, there is no monoclonal spike in his blood.  His IgG level was 508 mg/dL.  The Kappa light chain was 2 mg/dL.  His appetite has been good.  He is going to watch what he eats for Thanksgiving.  He has had no problems with nausea or vomiting.  He has had no problems with bowels or bladder.  He has had no cough or shortness of breath.  There has been no bleeding.  I must say that oral iron has worked incredibly well for him.  His hemoglobin is come up quite nicely.    Currently, I would have to say that his performance status is probably ECOG 1.    Medications:  Allergies as of 08/16/2022       Reactions   Lactose Intolerance (gi) Diarrhea        Medication List        Accurate as of August 16, 2022  3:41 PM. If you have any questions, ask your nurse or doctor.          acetaminophen 500 MG tablet Commonly known as: TYLENOL Take 1,000 mg by mouth every 4 (four) hours as needed for moderate pain or headache.   allopurinol 100 MG tablet Commonly known as: ZYLOPRIM Take 1 tab by mouth every other day   amiodarone 100 MG tablet Commonly known as: PACERONE TAKE 1 TABLET(100 MG) BY MOUTH DAILY   amoxicillin-clavulanate 875-125 MG tablet Commonly known as: AUGMENTIN Take 1 tablet by mouth 2 (two) times daily.   ARTIFICIAL TEARS OP Place 1 drop into both eyes daily as needed (dry eyes).   ascorbic acid 500  MG tablet Commonly known as: VITAMIN C Take 500 mg by mouth daily.   carvedilol 6.25 MG tablet Commonly known as: COREG TAKE 1 TABLET(6.25 MG) BY MOUTH TWICE DAILY   dapagliflozin propanediol 10 MG Tabs tablet Commonly known as: Farxiga Take 1 tablet (10 mg total) by mouth daily before breakfast.   Eliquis 5 MG Tabs tablet Generic drug: apixaban TAKE 1 TABLET BY MOUTH TWICE DAILY   famciclovir 500 MG tablet Commonly known as: FAMVIR Take 1 tablet (500 mg total) by mouth daily.   ferrous sulfate 324 (65 Fe) MG Tbec Take 1 tablet by mouth daily.   fluticasone 50 MCG/ACT nasal spray Commonly known as: FLONASE Place 2 sprays into both nostrils daily.   furosemide 80 MG tablet Commonly known as: LASIX TAKE 1 TABLET(80 MG) BY MOUTH DAILY   isosorbide-hydrALAZINE 20-37.5 MG tablet Commonly known as: BIDIL TAKE 1 TABLET BY MOUTH THREE TIMES DAILY   levothyroxine 75 MCG tablet Commonly known as: SYNTHROID TAKE 1 TABLET BY MOUTH DAILY IN THE MORNING. EXCEPT TAKE 1/2 TABLET ON SUNDAYS IN THE MORNING   loperamide 2 MG capsule Commonly known as: IMODIUM Take 2 mg by  mouth as needed for diarrhea or loose stools.   potassium chloride 20 MEQ/15ML (10%) Soln TAKE 15 ML BY MOUTH TWICE DAILY What changed: See the new instructions.   rosuvastatin 20 MG tablet Commonly known as: CRESTOR TAKE 1 TABLET(20 MG) BY MOUTH DAILY   vitamin B-12 100 MCG tablet Commonly known as: CYANOCOBALAMIN Take 100 mcg by mouth daily.   VITAMIN D PO Take 1 capsule by mouth daily.        Allergies:  Allergies  Allergen Reactions   Lactose Intolerance (Gi) Diarrhea    Past Medical History, Surgical history, Social history, and Family History were reviewed and updated.  Review of Systems: Review of Systems  Constitutional: Negative.   Eyes: Negative.   Respiratory: Negative.    Cardiovascular:  Positive for leg swelling.  Gastrointestinal: Negative.   Genitourinary: Negative.    Musculoskeletal: Negative.   Skin: Negative.   Neurological: Negative.   Endo/Heme/Allergies: Negative.   Psychiatric/Behavioral: Negative.       Physical Exam:  height is '5\' 9"'$  (1.753 m) and weight is 199 lb (90.3 kg). His oral temperature is 97.5 F (36.4 C) (abnormal). His blood pressure is 120/85 and his pulse is 81. His respiration is 18 and oxygen saturation is 98%.   Wt Readings from Last 3 Encounters:  08/16/22 199 lb (90.3 kg)  06/30/22 199 lb (90.3 kg)  06/19/22 200 lb 12.8 oz (91.1 kg)   His vital signs show temperature of 98.2.  Pulse 61.  Blood pressure 126/88.  Weight is 205 pounds.  Physical Exam Vitals reviewed.  HENT:     Head: Normocephalic and atraumatic.  Eyes:     Pupils: Pupils are equal, round, and reactive to light.  Cardiovascular:     Rate and Rhythm: Normal rate and regular rhythm.     Heart sounds: Normal heart sounds.  Pulmonary:     Effort: Pulmonary effort is normal.     Breath sounds: Normal breath sounds.  Abdominal:     General: Bowel sounds are normal.     Palpations: Abdomen is soft.  Musculoskeletal:        General: No tenderness or deformity. Normal range of motion.     Cervical back: Normal range of motion.  Lymphadenopathy:     Cervical: No cervical adenopathy.  Skin:    General: Skin is warm and dry.     Findings: No erythema or rash.  Neurological:     Mental Status: He is alert and oriented to person, place, and time.  Psychiatric:        Behavior: Behavior normal.        Thought Content: Thought content normal.        Judgment: Judgment normal.     Lab Results  Component Value Date   WBC 4.5 08/16/2022   HGB 12.9 (L) 08/16/2022   HCT 40.4 08/16/2022   MCV 96.4 08/16/2022   PLT 125 (L) 08/16/2022   Lab Results  Component Value Date   FERRITIN 37 06/30/2022   IRON 29 (L) 06/30/2022   TIBC 375 06/30/2022   UIBC 346 06/30/2022   IRONPCTSAT 8 (L) 06/30/2022   Lab Results  Component Value Date   RETICCTPCT  1.3 04/28/2022   RBC 4.19 (L) 08/16/2022   Lab Results  Component Value Date   KPAFRELGTCHN 20.2 (H) 06/30/2022   LAMBDASER 17.5 06/30/2022   KAPLAMBRATIO 1.15 06/30/2022   Lab Results  Component Value Date   IGGSERUM 508 (L) 06/30/2022   IGA 85  06/30/2022   IGMSERUM 12 (L) 06/30/2022   Lab Results  Component Value Date   TOTALPROTELP 5.1 (L) 06/30/2022   ALBUMINELP 2.9 06/30/2022   A1GS 0.2 06/30/2022   A2GS 0.6 06/30/2022   BETS 0.9 06/30/2022   GAMS 0.5 06/30/2022   MSPIKE Not Observed 06/30/2022   SPEI Comment 04/22/2021     Chemistry      Component Value Date/Time   NA 140 07/28/2022 1024   NA 144 01/04/2022 1143   K 4.0 07/28/2022 1024   CL 107 07/28/2022 1024   CO2 27 07/28/2022 1024   BUN 32 (H) 07/28/2022 1024   BUN 30 (H) 01/04/2022 1143   CREATININE 1.98 (H) 07/28/2022 1024   CREATININE 1.95 (H) 09/08/2016 0859      Component Value Date/Time   CALCIUM 9.0 07/28/2022 1024   ALKPHOS 102 07/28/2022 1024   AST 31 07/28/2022 1024   ALT 27 07/28/2022 1024   BILITOT 0.7 07/28/2022 1024       Impression and Plan: Eric [name] is a very nice 71 yo African American gentleman with IgG kappa myeloma.  He has done incredibly well with treatment.  I am just happy that he is done well.  His quality of life is doing superbly.  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  I would have to think that the myeloma is doing quite well right now.  He really is on a [place] type of protocol which is nice and works well for him.  Will do his next cycle in about 3 weeks.  I will plan to see him back right before Christmas.  Again, he is done incredibly well.  He does not wish to have a stem cell transplant.  As far as I can tell, for right now, he really does not he anything other than what we are doing for him.  I just hope that his poor wife continues to improve.     Volanda Napoleon, MD 11/22/20233:41 PM

## 2022-08-18 LAB — KAPPA/LAMBDA LIGHT CHAINS
Kappa free light chain: 21.2 mg/L — ABNORMAL HIGH (ref 3.3–19.4)
Kappa, lambda light chain ratio: 1.17 (ref 0.26–1.65)
Lambda free light chains: 18.1 mg/L (ref 5.7–26.3)

## 2022-08-18 LAB — IGG, IGA, IGM
IgA: 88 mg/dL (ref 61–437)
IgG (Immunoglobin G), Serum: 589 mg/dL — ABNORMAL LOW (ref 603–1613)
IgM (Immunoglobulin M), Srm: 12 mg/dL — ABNORMAL LOW (ref 20–172)

## 2022-08-21 ENCOUNTER — Inpatient Hospital Stay: Payer: Medicare Other

## 2022-08-21 LAB — IRON AND IRON BINDING CAPACITY (CC-WL,HP ONLY)
Iron: 82 ug/dL (ref 45–182)
Saturation Ratios: 20 % (ref 17.9–39.5)
TIBC: 412 ug/dL (ref 250–450)
UIBC: 330 ug/dL (ref 117–376)

## 2022-08-22 LAB — PROTEIN ELECTROPHORESIS, SERUM, WITH REFLEX
A/G Ratio: 1.8 — ABNORMAL HIGH (ref 0.7–1.7)
Albumin ELP: 3.6 g/dL (ref 2.9–4.4)
Alpha-1-Globulin: 0.2 g/dL (ref 0.0–0.4)
Alpha-2-Globulin: 0.5 g/dL (ref 0.4–1.0)
Beta Globulin: 0.9 g/dL (ref 0.7–1.3)
Gamma Globulin: 0.4 g/dL (ref 0.4–1.8)
Globulin, Total: 2 g/dL — ABNORMAL LOW (ref 2.2–3.9)
Total Protein ELP: 5.6 g/dL — ABNORMAL LOW (ref 6.0–8.5)

## 2022-08-25 ENCOUNTER — Ambulatory Visit: Payer: Medicare Other

## 2022-08-25 ENCOUNTER — Ambulatory Visit: Payer: Medicare Other | Attending: Interventional Cardiology

## 2022-08-25 ENCOUNTER — Inpatient Hospital Stay: Payer: Medicare Other

## 2022-08-25 DIAGNOSIS — I5022 Chronic systolic (congestive) heart failure: Secondary | ICD-10-CM

## 2022-08-25 DIAGNOSIS — Z952 Presence of prosthetic heart valve: Secondary | ICD-10-CM

## 2022-08-25 LAB — ECHOCARDIOGRAM COMPLETE
AR max vel: 2.39 cm2
AV Area VTI: 2.21 cm2
AV Area mean vel: 2.24 cm2
AV Mean grad: 4 mmHg
AV Peak grad: 8 mmHg
Ao pk vel: 1.41 m/s
Calc EF: 31.9 %
MV M vel: 5.17 m/s
MV Peak grad: 106.9 mmHg
Radius: 0.8 cm
S' Lateral: 5.8 cm
Single Plane A2C EF: 30.7 %
Single Plane A4C EF: 34 %

## 2022-08-26 ENCOUNTER — Encounter: Payer: Self-pay | Admitting: Hematology & Oncology

## 2022-08-26 LAB — PRO B NATRIURETIC PEPTIDE: NT-Pro BNP: 3947 pg/mL — ABNORMAL HIGH (ref 0–376)

## 2022-08-26 LAB — BASIC METABOLIC PANEL
BUN/Creatinine Ratio: 19 (ref 10–24)
BUN: 37 mg/dL — ABNORMAL HIGH (ref 8–27)
CO2: 22 mmol/L (ref 20–29)
Calcium: 8.6 mg/dL (ref 8.6–10.2)
Chloride: 107 mmol/L — ABNORMAL HIGH (ref 96–106)
Creatinine, Ser: 1.98 mg/dL — ABNORMAL HIGH (ref 0.76–1.27)
Glucose: 115 mg/dL — ABNORMAL HIGH (ref 70–99)
Potassium: 3.8 mmol/L (ref 3.5–5.2)
Sodium: 144 mmol/L (ref 134–144)
eGFR: 36 mL/min/{1.73_m2} — ABNORMAL LOW (ref >59)

## 2022-08-29 ENCOUNTER — Other Ambulatory Visit: Payer: Self-pay | Admitting: Nurse Practitioner

## 2022-08-29 MED ORDER — PREDNISONE 10 MG (21) PO TBPK: ORAL_TABLET | ORAL | 0 refills | Status: DC

## 2022-08-31 ENCOUNTER — Ambulatory Visit (INDEPENDENT_AMBULATORY_CARE_PROVIDER_SITE_OTHER): Payer: Medicare Other | Admitting: Nurse Practitioner

## 2022-08-31 ENCOUNTER — Encounter: Payer: Self-pay | Admitting: Nurse Practitioner

## 2022-08-31 VITALS — BP 138/82 | HR 100 | Temp 98.1°F | Ht 69.0 in | Wt 201.8 lb

## 2022-08-31 DIAGNOSIS — I129 Hypertensive chronic kidney disease with stage 1 through stage 4 chronic kidney disease, or unspecified chronic kidney disease: Secondary | ICD-10-CM

## 2022-08-31 DIAGNOSIS — Z8616 Personal history of COVID-19: Secondary | ICD-10-CM | POA: Diagnosis not present

## 2022-08-31 DIAGNOSIS — E119 Type 2 diabetes mellitus without complications: Secondary | ICD-10-CM

## 2022-08-31 DIAGNOSIS — E1122 Type 2 diabetes mellitus with diabetic chronic kidney disease: Secondary | ICD-10-CM

## 2022-08-31 DIAGNOSIS — E1169 Type 2 diabetes mellitus with other specified complication: Secondary | ICD-10-CM | POA: Diagnosis not present

## 2022-08-31 DIAGNOSIS — E032 Hypothyroidism due to medicaments and other exogenous substances: Secondary | ICD-10-CM

## 2022-08-31 DIAGNOSIS — E782 Mixed hyperlipidemia: Secondary | ICD-10-CM | POA: Diagnosis not present

## 2022-08-31 DIAGNOSIS — M109 Gout, unspecified: Secondary | ICD-10-CM

## 2022-08-31 DIAGNOSIS — I480 Paroxysmal atrial fibrillation: Secondary | ICD-10-CM | POA: Diagnosis not present

## 2022-08-31 DIAGNOSIS — N1832 Chronic kidney disease, stage 3b: Secondary | ICD-10-CM

## 2022-08-31 MED ORDER — CARVEDILOL 6.25 MG PO TABS: ORAL_TABLET | ORAL | 1 refills | Status: DC

## 2022-08-31 MED ORDER — ISOSORB DINITRATE-HYDRALAZINE 20-37.5 MG PO TABS: 1.0000 | ORAL_TABLET | Freq: Three times a day (TID) | ORAL | 3 refills | Status: DC

## 2022-08-31 MED ORDER — APIXABAN 5 MG PO TABS: 5.0000 mg | ORAL_TABLET | Freq: Two times a day (BID) | ORAL | 5 refills | Status: DC

## 2022-08-31 MED ORDER — ALLOPURINOL 100 MG PO TABS: ORAL_TABLET | ORAL | 1 refills | Status: DC

## 2022-08-31 MED ORDER — ROSUVASTATIN CALCIUM 20 MG PO TABS: ORAL_TABLET | ORAL | 2 refills | Status: DC

## 2022-08-31 MED ORDER — LEVOTHYROXINE SODIUM 75 MCG PO TABS: 75.0000 ug | ORAL_TABLET | Freq: Every day | ORAL | 1 refills | Status: DC

## 2022-08-31 NOTE — Progress Notes (Signed)
I,Eric [name],acting as a Education administrator for [name], FNP.,have documented all relevant documentation on the behalf of [name], FNP,as directed by  [name], FNP while in the presence of [name], [place].    Subjective:     Patient ID: Eric [name]., MD , male    DOB: September 20, 1951 , 71 y.o.   MRN: [id number]   Chief Complaint  Patient presents with   Diabetes   Hypertension    HPI  The patient is here for a follow-up on his diabetes and blood pressure. without medication. He reports compliance with BP/heart failure meds. Denies headaches, cp. He has brought in Vit D3- 1000U he takes once daily.  He reports the prednisone has helped a lot, he is taking for a gout flare up dealing with his left ankle. He has started prednisone with relief.   He has not seen Nephrology in some time but is on Farxiga, he feels like it is going well with the medication. He has lost weight and kidney functions are stable. With the chemotherapy he would have more fluid and have to increase lasix but has done well since then. He is going for chemo monthly, he will increase his intake of tea and water to help with his venous system.   He has no specific questions or concerns. He did not take all of his lasix last night and none the night before.   Wt Readings from Last 3 Encounters: 08/31/22 : 201 lb 12.8 oz (91.5 kg) 08/16/22 : 199 lb (90.3 kg) 06/30/22 : 199 lb (90.3 kg)     Diabetes He presents for his follow-up diabetic visit. He has type 2 diabetes mellitus. There are no hypoglycemic associated symptoms. Pertinent negatives for diabetes include no blurred vision and no chest pain. There are no hypoglycemic complications. There are no diabetic complications. Pertinent negatives for diabetic complications include no CVA. Risk factors for coronary artery disease include obesity and sedentary lifestyle. Current diabetic treatment includes oral agent (dual therapy). He is following a diabetic  diet. Meal planning includes avoidance of concentrated sweets. He participates in exercise intermittently. His breakfast blood glucose is taken between 8-9 am. His breakfast blood glucose range is generally 110-130 mg/dl. An ACE inhibitor/angiotensin II receptor blocker is not being taken. He sees a podiatrist.Eye exam is not current.  Hypertension This is a chronic problem. The current episode started more than 1 year ago. The problem has been gradually improving since onset. The problem is controlled. Pertinent negatives include no blurred vision, chest pain, palpitations or shortness of breath. Risk factors for coronary artery disease include diabetes mellitus, dyslipidemia, sedentary lifestyle, obesity, male gender and stress. Past treatments include beta blockers and diuretics. The current treatment provides moderate improvement. Hypertensive end-organ damage includes kidney disease. There is no history of CVA.     Past Medical History:  Diagnosis Date   Acute blood loss anemia    Acute encephalopathy    Acute hypoxemic respiratory failure (HCC)    Acute idiopathic gout of right ankle    Acute lumbar back pain    Acute on chronic systolic heart failure (HCC)    Acute respiratory failure (HCC)    AKI (acute kidney injury) ([facility])    Altered mental status    Aortic aneurysm without rupture ([facility]) 02/09/2016   Aortic root enlargement ([place]) 02/13/2012   Aortic valve regurgitation, acquired 02/13/2012   Arrhythmia    Atrial flutter ([place]) 03/18/2012   Back pain    Cardiac arrest (  [place]) 02/01/2016   CHF (congestive heart failure) ([place])    Chronic anticoagulation 03/04/2013   Chronic kidney disease    kidney fx studies increased    Chronic lower back pain    Chronic renal insufficiency    Chronic systolic heart failure ([place]) 02/13/2012   Recent diagnosis 4 / 2013, LVEF 25% by Echo 12/2011  03/2013: Echo at [facility] Cardiology Conclusions: 1. Left ventricular ejection fraction estimated by 2D at 40-45  percent. 2. Mild concentric left ventricular hypertrophy. 3. Mild left atrial enlargement. 4. Moderate aortic valve regurgitation. 5. The aortic root at the sinus(es) of valsalva is moderately dilated 6. Mild mitral valve regurgitation. 7.   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    Goals of care, counseling/discussion 05/05/2020   HCAP (healthcare-associated pneumonia)    Heart murmur    Hyperlipidemia 02/13/2012   Hypertension    Hypothyroidism    Internal hemorrhoids without mention of complication 10/03/3233   Labile blood pressure    Left bundle branch block 02/13/2012   Leg weakness, bilateral    Long term (current) use of anticoagulants 08/06/2013   Eliquis therapy    Long term current use of amiodarone 08/07/2016   Lower extremity weakness    Migraine 02/13/12   "opthalmic"   Multiple myeloma ([place]) 05/05/2020   Non-traumatic rhabdomyolysis    Obesity (BMI 30-39.9) 02/13/2012   Pain    Paroxysmal atrial fibrillation (HCC)    Pneumonia    Retroperitoneal bleed    Right ankle pain    Right knee pain    Severe aortic regurgitation 02/09/2016   Special screening for malignant neoplasms, colon 04/11/2013   Thyroid activity decreased    Tibial pain    Varicose vein of leg    right   Ventricular fibrillation (HCC) 02/09/2016   Weakness of both lower extremities      Family History  Problem Relation Age of Onset   Hypertension Mother    Heart disease Mother    Heart failure Mother    Diabetes Mother    Hypertension Father    Heart disease Father    Heart failure Father       Current Outpatient Medications:    acetaminophen (TYLENOL) 500 MG tablet, Take 1,000 mg by mouth every 4 (four) hours as needed for moderate pain or headache., Disp: , Rfl:    amiodarone (PACERONE) 100 MG tablet, TAKE 1 TABLET(100 MG) BY MOUTH DAILY, Disp: 90 tablet, Rfl: 3   dapagliflozin propanediol (FARXIGA) 10 MG TABS tablet, Take 1 tablet (10 mg total) by mouth daily before breakfast., Disp: 30 tablet, Rfl: 11   famciclovir (FAMVIR) 500 MG tablet, Take 1 tablet (500 mg total) by mouth daily., Disp: 30 tablet, Rfl: 3   ferrous sulfate 324 (65 Fe) MG TBEC, Take 1 tablet by mouth daily., Disp: , Rfl:    fluticasone (FLONASE) 50 MCG/ACT nasal spray, Place 2 sprays into both nostrils daily., Disp: 16 g, Rfl: 2   furosemide (LASIX) 80 MG tablet, TAKE 1 TABLET(80 MG) BY MOUTH  DAILY, Disp: 90 tablet, Rfl: 3   Hypromellose (ARTIFICIAL TEARS OP), Place 1 drop into both eyes daily as needed (dry eyes)., Disp: , Rfl:    loperamide (IMODIUM) 2 MG capsule, Take 2 mg by mouth as needed for diarrhea or loose stools. (Patient not taking: Reported on 09/15/2022), Disp: , Rfl:    predniSONE (STERAPRED UNI-PAK 21 TAB) 10 MG (21) TBPK tablet, Take as directed, Disp: 21 tablet, Rfl: 0   vitamin B-12 (CYANOCOBALAMIN) 100 MCG tablet, Take 100 mcg by mouth daily., Disp: , Rfl:    vitamin C (ASCORBIC ACID) 500 MG tablet, Take 500 mg by mouth daily., Disp: , Rfl:    VITAMIN D PO, Take 1 capsule by mouth daily., Disp: , Rfl:    allopurinol (ZYLOPRIM) 100 MG tablet, Take 1 tab by mouth every other day, Disp: 45 tablet, Rfl: 1   apixaban (ELIQUIS) 5 MG TABS tablet, Take 1 tablet (5 mg total) by mouth 2 (two) times daily., Disp: 60 tablet, Rfl: 5   carvedilol (COREG) 6.25 MG tablet, Take 1 tablet by mouth twice a day, Disp: 180 tablet, Rfl: 1   isosorbide-hydrALAZINE (BIDIL) 20-37.5 MG tablet, Take 1 tablet by mouth 3 (three) times daily., Disp: 270 tablet, Rfl: 3   levothyroxine (SYNTHROID) 75 MCG tablet, Take 1  tablet (75 mcg total) by mouth daily before breakfast., Disp: 90 tablet, Rfl: 1   potassium chloride 20 MEQ/15ML (10%) SOLN, TAKE 15 ML BY MOUTH TWICE DAILY, Disp: 946 mL, Rfl: 1   rosuvastatin (CRESTOR) 20 MG tablet, TAKE 1 TABLET(20 MG) BY MOUTH DAILY, Disp: 90 tablet, Rfl: 2   Allergies  Allergen Reactions   Lactose Intolerance (Gi) Diarrhea     Review of Systems  Constitutional: Negative.   HENT: Negative.    Eyes:  Negative for blurred vision.  Respiratory:  Negative for shortness of breath.   Cardiovascular: Negative.  Negative for chest pain and palpitations.  Gastrointestinal: Negative.   Skin: Negative.   Allergic/Immunologic: Negative.   Neurological: Negative.   Hematological: Negative.   Psychiatric/Behavioral: Negative.       Today's Vitals   08/31/22 0839 08/31/22 0917  BP: (!) 140/90 138/82  Pulse: 100   Temp: 98.1 F (36.7 C)   SpO2: 94%   Weight: 201 lb 12.8 oz (91.5 kg)   Height: _0  (1.753 m)    Body mass index is 29.8 kg/m.  Wt Readings from Last 3 Encounters:  09/15/22 197 lb 1.9 oz (89.4 kg)  08/31/22 201 lb 12.8 oz (91.5 kg)  08/16/22 199 lb (90.3 kg)    Objective:  Physical Exam Vitals reviewed.  Constitutional:      General: He is not in acute distress.    Appearance: Normal appearance.  Cardiovascular:     Pulses: Normal pulses.     Heart sounds: Normal heart sounds. No murmur heard. Pulmonary:     Effort: Pulmonary effort is normal. No respiratory distress.     Breath sounds: Normal breath sounds. No wheezing.  Musculoskeletal:        General: Tenderness (left ankle) present. No deformity.  Skin:    General: Skin is warm and dry.  Neurological:     General: No focal deficit present.     Mental Status: He is alert and oriented to person, place, and time.     Cranial Nerves: No cranial nerve deficit.     Motor: No weakness.  Psychiatric:        Mood and Affect: Mood normal.  Behavior: Behavior normal.        Thought  Content: Thought content normal.        Judgment: Judgment normal.         Assessment And Plan:     1. Diabetes mellitus type 2 in nonobese [facility]) Comments: Will check his HgbA1c, has not been checked in approximately one year - Hemoglobin A1c  2. Hypertensive nephropathy Comments: Blood pressure is fairly controlled, encouraged to focus on lifestyle modifications - carvedilol (COREG) 6.25 MG tablet; Take 1 tablet by mouth twice a day  Dispense: 180 tablet; Refill: 1 - isosorbide-hydrALAZINE (BIDIL) 20-37.5 MG tablet; Take 1 tablet by mouth 3 (three) times daily.  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  4. Acute gout of left ankle, unspecified cause Comments: Treatment with prednisone with some improvement. Will refill his allopurinol. Continue to avoid food triggers. - allopurinol (ZYLOPRIM) 100 MG tablet; Take 1 tab by mouth every other day  Dispense: 45 tablet; Refill: 1  5. History of COVID-19 - SARS-CoV-2 Antibodies  6. Mixed hyperlipidemia Comments: Continue statin, tolerating well. - Lipid panel  7. Paroxysmal atrial fibrillation (HCC) Comments: Currently taking amniodarone, also taking eliquis and continue f/u with Cardiology - apixaban (ELIQUIS) 5 MG TABS tablet; Take 1 tablet (5 mg total) by mouth 2 (two) times daily.  Dispense: 60 tablet; Refill: 5 - levothyroxine (SYNTHROID) 75 MCG tablet; Take 1 tablet (75 mcg total) by mouth daily before breakfast.  Dispense: 90 tablet; Refill: 1 - TSH + free T4  8. Type 2 diabetes mellitus with stage 3b chronic kidney disease, without long-term current use of insulin ([place])     Patient was given opportunity to ask questions. Patient verbalized understanding of the plan and was able to repeat key elements of the plan. All questions were answered to their satisfaction.  [name], FNP   I, [name], FNP, have  reviewed all documentation for this visit. The documentation on 08/31/22 for the exam, diagnosis, procedures, and orders are all accurate and complete.   IF YOU HAVE BEEN REFERRED TO A SPECIALIST, IT MAY TAKE 1-2 WEEKS TO SCHEDULE/PROCESS THE REFERRAL. IF YOU HAVE NOT HEARD FROM US/SPECIALIST IN TWO WEEKS, PLEASE GIVE [place] A CALL AT [phone].   THE PATIENT IS ENCOURAGED TO PRACTICE SOCIAL DISTANCING DUE TO THE COVID-19 PANDEMIC.

## 2022-08-31 NOTE — Patient Instructions (Signed)

## 2022-09-01 LAB — SARS-COV-2 ANTIBODIES: SARS-CoV-2 Antibodies: POSITIVE

## 2022-09-01 LAB — TSH+FREE T4
Free T4: 1.29 ng/dL (ref 0.82–1.77)
TSH: 1.74 u[IU]/mL (ref 0.450–4.500)

## 2022-09-01 LAB — HEMOGLOBIN A1C
Est. average glucose Bld gHb Est-mCnc: 134 mg/dL
Hgb A1c MFr Bld: 6.3 % — ABNORMAL HIGH (ref 4.8–5.6)

## 2022-09-01 LAB — LIPID PANEL
Chol/HDL Ratio: 2.1 ratio (ref 0.0–5.0)
Cholesterol, Total: 186 mg/dL (ref 100–199)
HDL: 87 mg/dL (ref >39)
LDL Chol Calc (NIH): 90 mg/dL (ref 0–99)
Triglycerides: 42 mg/dL (ref 0–149)
VLDL Cholesterol Cal: 9 mg/dL (ref 5–40)

## 2022-09-12 ENCOUNTER — Ambulatory Visit (INDEPENDENT_AMBULATORY_CARE_PROVIDER_SITE_OTHER): Payer: Medicare Other

## 2022-09-12 DIAGNOSIS — I428 Other cardiomyopathies: Secondary | ICD-10-CM | POA: Diagnosis not present

## 2022-09-12 LAB — CUP PACEART REMOTE DEVICE CHECK
Battery Remaining Longevity: 23 mo
Battery Remaining Percentage: 28 %
Battery Voltage: 2.84 V
Date Time Interrogation Session: 20231219020014
HighPow Impedance: 57 Ohm
HighPow Impedance: 57 Ohm
Implantable Lead Connection Status: 753985
Implantable Lead Connection Status: 753985
Implantable Lead Connection Status: 753985
Implantable Lead Implant Date: 20170525
Implantable Lead Implant Date: 20170525
Implantable Lead Implant Date: 20170525
Implantable Lead Location: 753858
Implantable Lead Location: 753859
Implantable Lead Location: 753860
Implantable Lead Model: 7122
Implantable Pulse Generator Implant Date: 20170525
Lead Channel Impedance Value: 380 Ohm
Lead Channel Impedance Value: 440 Ohm
Lead Channel Impedance Value: 600 Ohm
Lead Channel Pacing Threshold Amplitude: 0.5 V
Lead Channel Pacing Threshold Amplitude: 1.25 V
Lead Channel Pacing Threshold Pulse Width: 0.5 ms
Lead Channel Pacing Threshold Pulse Width: 0.5 ms
Lead Channel Sensing Intrinsic Amplitude: 0.2 mV
Lead Channel Sensing Intrinsic Amplitude: 11.4 mV
Lead Channel Setting Pacing Amplitude: 2 V
Lead Channel Setting Pacing Amplitude: 2.5 V
Lead Channel Setting Pacing Pulse Width: 0.5 ms
Lead Channel Setting Pacing Pulse Width: 0.5 ms
Lead Channel Setting Sensing Sensitivity: 0.5 mV
Pulse Gen Serial Number: 7357926
Zone Setting Status: 755011

## 2022-09-15 ENCOUNTER — Inpatient Hospital Stay: Payer: Medicare Other | Attending: Family

## 2022-09-15 ENCOUNTER — Inpatient Hospital Stay: Payer: Medicare Other

## 2022-09-15 ENCOUNTER — Other Ambulatory Visit: Payer: Self-pay

## 2022-09-15 ENCOUNTER — Encounter: Payer: Self-pay | Admitting: Hematology & Oncology

## 2022-09-15 ENCOUNTER — Inpatient Hospital Stay (HOSPITAL_BASED_OUTPATIENT_CLINIC_OR_DEPARTMENT_OTHER): Payer: Medicare Other | Admitting: Hematology & Oncology

## 2022-09-15 VITALS — BP 119/64 | HR 70 | Temp 97.9°F | Resp 18 | Ht 69.0 in | Wt 197.1 lb

## 2022-09-15 VITALS — BP 124/82 | HR 70 | Resp 17

## 2022-09-15 DIAGNOSIS — Z79899 Other long term (current) drug therapy: Secondary | ICD-10-CM | POA: Diagnosis not present

## 2022-09-15 DIAGNOSIS — C9000 Multiple myeloma not having achieved remission: Secondary | ICD-10-CM

## 2022-09-15 DIAGNOSIS — M7989 Other specified soft tissue disorders: Secondary | ICD-10-CM | POA: Diagnosis not present

## 2022-09-15 DIAGNOSIS — Z5111 Encounter for antineoplastic chemotherapy: Secondary | ICD-10-CM | POA: Diagnosis present

## 2022-09-15 DIAGNOSIS — Z5112 Encounter for antineoplastic immunotherapy: Secondary | ICD-10-CM | POA: Insufficient documentation

## 2022-09-15 LAB — CBC WITH DIFFERENTIAL (CANCER CENTER ONLY)
Abs Immature Granulocytes: 0.05 10*3/uL (ref 0.00–0.07)
Basophils Absolute: 0 10*3/uL (ref 0.0–0.1)
Basophils Relative: 1 %
Eosinophils Absolute: 0.1 10*3/uL (ref 0.0–0.5)
Eosinophils Relative: 1 %
HCT: 40.7 % (ref 39.0–52.0)
Hemoglobin: 13.4 g/dL (ref 13.0–17.0)
Immature Granulocytes: 1 %
Lymphocytes Relative: 6 %
Lymphs Abs: 0.5 10*3/uL — ABNORMAL LOW (ref 0.7–4.0)
MCH: 31.5 pg (ref 26.0–34.0)
MCHC: 32.9 g/dL (ref 30.0–36.0)
MCV: 95.5 fL (ref 80.0–100.0)
Monocytes Absolute: 0.9 10*3/uL (ref 0.1–1.0)
Monocytes Relative: 10 %
Neutro Abs: 6.6 10*3/uL (ref 1.7–7.7)
Neutrophils Relative %: 81 %
Platelet Count: 132 10*3/uL — ABNORMAL LOW (ref 150–400)
RBC: 4.26 MIL/uL (ref 4.22–5.81)
RDW: 16.4 % — ABNORMAL HIGH (ref 11.5–15.5)
WBC Count: 8.2 10*3/uL (ref 4.0–10.5)
nRBC: 0 % (ref 0.0–0.2)

## 2022-09-15 LAB — CMP (CANCER CENTER ONLY)
ALT: 22 U/L (ref 0–44)
AST: 20 U/L (ref 15–41)
Albumin: 3.7 g/dL (ref 3.5–5.0)
Alkaline Phosphatase: 75 U/L (ref 38–126)
Anion gap: 9 (ref 5–15)
BUN: 38 mg/dL — ABNORMAL HIGH (ref 8–23)
CO2: 25 mmol/L (ref 22–32)
Calcium: 8.6 mg/dL — ABNORMAL LOW (ref 8.9–10.3)
Chloride: 109 mmol/L (ref 98–111)
Creatinine: 1.98 mg/dL — ABNORMAL HIGH (ref 0.61–1.24)
GFR, Estimated: 36 mL/min — ABNORMAL LOW (ref >60)
Glucose, Bld: 191 mg/dL — ABNORMAL HIGH (ref 70–99)
Potassium: 4 mmol/L (ref 3.5–5.1)
Sodium: 143 mmol/L (ref 135–145)
Total Bilirubin: 1 mg/dL (ref 0.3–1.2)
Total Protein: 5.7 g/dL — ABNORMAL LOW (ref 6.5–8.1)

## 2022-09-15 LAB — IRON AND IRON BINDING CAPACITY (CC-WL,HP ONLY)
Iron: 92 ug/dL (ref 45–182)
Saturation Ratios: 26 % (ref 17.9–39.5)
TIBC: 349 ug/dL (ref 250–450)
UIBC: 257 ug/dL (ref 117–376)

## 2022-09-15 LAB — RETICULOCYTES
Immature Retic Fract: 10.1 % (ref 2.3–15.9)
RBC.: 4.32 MIL/uL (ref 4.22–5.81)
Retic Count, Absolute: 60.5 10*3/uL (ref 19.0–186.0)
Retic Ct Pct: 1.4 % (ref 0.4–3.1)

## 2022-09-15 LAB — FERRITIN: Ferritin: 56 ng/mL (ref 24–336)

## 2022-09-15 LAB — LACTATE DEHYDROGENASE: LDH: 187 U/L (ref 98–192)

## 2022-09-15 MED ORDER — SODIUM CHLORIDE 0.9 % IV SOLN
20.0000 mg | Freq: Once | INTRAVENOUS | Status: AC
  Administered 2022-09-15: 20 mg via INTRAVENOUS
  Filled 2022-09-15: qty 20

## 2022-09-15 MED ORDER — PALONOSETRON HCL INJECTION 0.25 MG/5ML
0.2500 mg | Freq: Once | INTRAVENOUS | Status: AC
  Administered 2022-09-15: 0.25 mg via INTRAVENOUS
  Filled 2022-09-15: qty 5

## 2022-09-15 MED ORDER — SODIUM CHLORIDE 0.9 % IV SOLN: Freq: Once | INTRAVENOUS | Status: AC

## 2022-09-15 MED ORDER — BORTEZOMIB CHEMO SQ INJECTION 3.5 MG (2.5MG/ML)
1.3000 mg/m2 | Freq: Once | INTRAMUSCULAR | Status: AC
  Administered 2022-09-15: 2.75 mg via SUBCUTANEOUS
  Filled 2022-09-15: qty 1.1

## 2022-09-15 MED ORDER — SODIUM CHLORIDE 0.9 % IV SOLN
400.0000 mg/m2 | Freq: Once | INTRAVENOUS | Status: AC
  Administered 2022-09-15: 820 mg via INTRAVENOUS
  Filled 2022-09-15: qty 41

## 2022-09-15 NOTE — Progress Notes (Signed)
Hematology and Oncology Follow Up Visit  Eric [name]., MD [id number] August 31, 1951 71 y.o. 09/15/2022   Principle Diagnosis:  IgG kappa myeloma  - 1q+   Current Therapy:        CyBorD -- s/p cycle  #18 - started on 06/04/2020   Interim History:  Eric [name] is here today for follow-up.  As always, Eric [name] is busy at work.  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  Eric [name] tries to create his own schedule.  Eric [name] says that his family has come into town for Christmas.  I am sure that they will have a wonderful Christmas holiday.  Eric [name] is done incredibly well with respect to the myeloma.  Eric [name] has really had no evidence of disease progression.  Eric [name] has had a very nice response.  When we last saw him, there is no monoclonal spike in his blood.  His IgG level was 589 mg/dL.  The Kappa light chain was 2.1 mg/dL.  Eric [name] has had no problems with nausea or vomiting.  The iron that we give him has really helped him quite a bit.  His hemoglobin has normalized.  Eric [name] has had no problems with fever.  Eric [name] has had no rashes.  There has been no leg swelling.  Eric [name] has had no obvious change in bowel or bladder habits.  There has been no cough or shortness of breath.  Overall, I would say that his performance status is probably ECOG 0.  Medications:  Allergies as of 09/15/2022       Reactions   Lactose Intolerance (gi) Diarrhea        Medication List        Accurate as of September 15, 2022  1:44 PM. If you have any questions, ask your nurse or doctor.          STOP taking these medications    amoxicillin-clavulanate 875-125 MG tablet Commonly known as: AUGMENTIN Stopped by: Volanda Napoleon, MD       TAKE these medications    acetaminophen 500 MG tablet Commonly known as: TYLENOL Take 1,000 mg by mouth every 4 (four) hours as needed for moderate pain or headache.   allopurinol 100 MG tablet Commonly known as: ZYLOPRIM Take 1 tab by mouth every other day   amiodarone 100 MG tablet Commonly known as:  PACERONE TAKE 1 TABLET(100 MG) BY MOUTH DAILY   apixaban 5 MG Tabs tablet Commonly known as: Eliquis Take 1 tablet (5 mg total) by mouth 2 (two) times daily.   ARTIFICIAL TEARS OP Place 1 drop into both eyes daily as needed (dry eyes).   ascorbic acid 500 MG tablet Commonly known as: VITAMIN C Take 500 mg by mouth daily.   carvedilol 6.25 MG tablet Commonly known as: COREG Take 1 tablet by mouth twice a day   dapagliflozin propanediol 10 MG Tabs tablet Commonly known as: Farxiga Take 1 tablet (10 mg total) by mouth daily before breakfast.   famciclovir 500 MG tablet Commonly known as: FAMVIR Take 1 tablet (500 mg total) by mouth daily.   ferrous sulfate 324 (65 Fe) MG Tbec Take 1 tablet by mouth daily.   fluticasone 50 MCG/ACT nasal spray Commonly known as: FLONASE Place 2 sprays into both nostrils daily.   furosemide 80 MG tablet Commonly known as: LASIX TAKE 1 TABLET(80 MG) BY MOUTH DAILY   isosorbide-hydrALAZINE 20-37.5 MG tablet Commonly known as: BIDIL Take 1 tablet by mouth 3 (three) times daily.   levothyroxine 75 MCG tablet Commonly known as:  SYNTHROID Take 1 tablet (75 mcg total) by mouth daily before breakfast.   loperamide 2 MG capsule Commonly known as: IMODIUM Take 2 mg by mouth as needed for diarrhea or loose stools.   potassium chloride 20 MEQ/15ML (10%) Soln TAKE 15 ML BY MOUTH TWICE DAILY   predniSONE 10 MG (21) Tbpk tablet Commonly known as: STERAPRED UNI-PAK 21 TAB Take as directed   rosuvastatin 20 MG tablet Commonly known as: CRESTOR TAKE 1 TABLET(20 MG) BY MOUTH DAILY   vitamin B-12 100 MCG tablet Commonly known as: CYANOCOBALAMIN Take 100 mcg by mouth daily.   VITAMIN D PO Take 1 capsule by mouth daily.        Allergies:  Allergies  Allergen Reactions   Lactose Intolerance (Gi) Diarrhea    Past Medical History, Surgical history, Social history, and Family History were reviewed and updated.  Review of  Systems: Review of Systems  Constitutional: Negative.   Eyes: Negative.   Respiratory: Negative.    Cardiovascular:  Positive for leg swelling.  Gastrointestinal: Negative.   Genitourinary: Negative.   Musculoskeletal: Negative.   Skin: Negative.   Neurological: Negative.   Endo/Heme/Allergies: Negative.   Psychiatric/Behavioral: Negative.       Physical Exam:  height is '5\' 9"'$  (1.753 m) and weight is 197 lb 1.9 oz (89.4 kg). His oral temperature is 97.9 F (36.6 C). His blood pressure is 119/64 and his pulse is 70. His respiration is 18 and oxygen saturation is 100%.   Wt Readings from Last 3 Encounters:  09/15/22 197 lb 1.9 oz (89.4 kg)  08/31/22 201 lb 12.8 oz (91.5 kg)  08/16/22 199 lb (90.3 kg)   His vital signs show temperature of 98.2.  Pulse 61.  Blood pressure 126/88.  Weight is 205 pounds.  Physical Exam Vitals reviewed.  HENT:     Head: Normocephalic and atraumatic.  Eyes:     Pupils: Pupils are equal, round, and reactive to light.  Cardiovascular:     Rate and Rhythm: Normal rate and regular rhythm.     Heart sounds: Normal heart sounds.  Pulmonary:     Effort: Pulmonary effort is normal.     Breath sounds: Normal breath sounds.  Abdominal:     General: Bowel sounds are normal.     Palpations: Abdomen is soft.  Musculoskeletal:        General: No tenderness or deformity. Normal range of motion.     Cervical back: Normal range of motion.  Lymphadenopathy:     Cervical: No cervical adenopathy.  Skin:    General: Skin is warm and dry.     Findings: No erythema or rash.  Neurological:     Mental Status: Eric [name] is alert and oriented to person, place, and time.  Psychiatric:        Behavior: Behavior normal.        Thought Content: Thought content normal.        Judgment: Judgment normal.    Lab Results  Component Value Date   WBC 8.2 09/15/2022   HGB 13.4 09/15/2022   HCT 40.7 09/15/2022   MCV 95.5 09/15/2022   PLT 132 (L) 09/15/2022   Lab Results   Component Value Date   FERRITIN 49 08/16/2022   IRON 82 08/16/2022   TIBC 412 08/16/2022   UIBC 330 08/16/2022   IRONPCTSAT 20 08/16/2022   Lab Results  Component Value Date   RETICCTPCT 1.4 09/15/2022   RBC 4.32 09/15/2022   Lab Results  Component  Value Date   KPAFRELGTCHN 21.2 (H) 08/16/2022   LAMBDASER 18.1 08/16/2022   KAPLAMBRATIO 1.17 08/16/2022   Lab Results  Component Value Date   IGGSERUM 589 (L) 08/16/2022   IGA 88 08/16/2022   IGMSERUM 12 (L) 08/16/2022   Lab Results  Component Value Date   TOTALPROTELP 5.6 (L) 08/16/2022   ALBUMINELP 3.6 08/16/2022   A1GS 0.2 08/16/2022   A2GS 0.5 08/16/2022   BETS 0.9 08/16/2022   GAMS 0.4 08/16/2022   MSPIKE Not Observed 08/16/2022   SPEI Comment 04/22/2021     Chemistry      Component Value Date/Time   NA 144 08/25/2022 1050   K 3.8 08/25/2022 1050   CL 107 (H) 08/25/2022 1050   CO2 22 08/25/2022 1050   BUN 37 (H) 08/25/2022 1050   CREATININE 1.98 (H) 08/25/2022 1050   CREATININE 1.93 (H) 08/16/2022 1518   CREATININE 1.95 (H) 09/08/2016 0859      Component Value Date/Time   CALCIUM 8.6 08/25/2022 1050   ALKPHOS 84 08/16/2022 1518   AST 24 08/16/2022 1518   ALT 21 08/16/2022 1518   BILITOT 1.1 08/16/2022 1518       Impression and Plan: Eric [name] is a very nice 71 yo African American gentleman with IgG kappa myeloma.  Eric [name] has done incredibly well with treatment.  I am just happy that Eric [name] is done well.  His quality of life is doing superbly.  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  Eric [name] is able to take care of his poor wife.  I am glad that she is doing better.  Again, Eric [name] has tolerated treatment well.  It is working well with respect to his work schedule.  Eric [name] is able to operate.  We will try to move his appointments to once a month now.  I think this would be reasonable for him.  Hopefully, we can try to get him through most of the Winter without having to see him all that often.   Volanda Napoleon,  MD 12/22/20231:44 PM

## 2022-09-15 NOTE — Patient Instructions (Signed)
[facility]  Discharge Instructions: Thank you for choosing [facility] to provide your oncology and hematology care.   If you have a lab appointment with the [facility], please go directly to the [facility] and check in at the registration area.  Wear comfortable clothing and clothing appropriate for easy access to any Portacath or PICC line.   We strive to give you quality time with your provider. You may need to reschedule your appointment if you arrive late (15 or more minutes).  Arriving late affects you and other patients whose appointments are after yours.  Also, if you miss three or more appointments without notifying the office, you may be dismissed from the clinic at the provider's discretion.      For prescription refill requests, have your pharmacy contact our office and allow 72 hours for refills to be completed.    Today you received the following chemotherapy and/or immunotherapy agents velcade and cytoxan      To help prevent nausea and vomiting after your treatment, we encourage you to take your nausea medication as directed.  BELOW ARE SYMPTOMS THAT SHOULD BE REPORTED IMMEDIATELY: *FEVER GREATER THAN 100.4 F (38 C) OR HIGHER *CHILLS OR SWEATING *NAUSEA AND VOMITING THAT IS NOT CONTROLLED WITH YOUR NAUSEA MEDICATION *UNUSUAL SHORTNESS OF BREATH *UNUSUAL BRUISING OR BLEEDING *URINARY PROBLEMS (pain or burning when urinating, or frequent urination) *BOWEL PROBLEMS (unusual diarrhea, constipation, pain near the anus) TENDERNESS IN MOUTH AND THROAT WITH OR WITHOUT PRESENCE OF ULCERS (sore throat, sores in mouth, or a toothache) UNUSUAL RASH, SWELLING OR PAIN  UNUSUAL VAGINAL DISCHARGE OR ITCHING   Items with * indicate a potential emergency and should be followed up as soon as possible or go to the Emergency Department if any problems should occur.  Please show the CHEMOTHERAPY ALERT CARD or IMMUNOTHERAPY ALERT CARD at  check-in to the Emergency Department and triage nurse. Should you have questions after your visit or need to cancel or reschedule your appointment, please contact [facility]  [phone] and follow the prompts.  Office hours are 8:00 a.m. to 4:30 p.m. Monday - Friday. Please note that voicemails left after 4:00 p.m. may not be returned until the following business day.  We are closed weekends and major holidays. You have access to a nurse at all times for urgent questions. Please call the main number to the clinic [phone] and follow the prompts.  For any non-urgent questions, you may also contact your provider using MyChart. We now offer e-Visits for anyone 16 and older to request care online for non-urgent symptoms. For details visit [email or web address].   Also download the MyChart app! Go to the app store, search "MyChart", open the app, select [facility], and log in with your MyChart username and password.  Masks are optional in the cancer centers. If you would like for your care team to wear a mask while they are taking care of you, please let them know. You may have one support person who is at least 70 years old accompany you for your appointments.

## 2022-09-15 NOTE — Progress Notes (Signed)
Reviewed labs with Dr. Marin Olp and pt ok to treat with creatinine 1.98

## 2022-09-17 LAB — IGG, IGA, IGM
IgA: 94 mg/dL (ref 61–437)
IgG (Immunoglobin G), Serum: 596 mg/dL — ABNORMAL LOW (ref 603–1613)
IgM (Immunoglobulin M), Srm: 13 mg/dL — ABNORMAL LOW (ref 20–172)

## 2022-09-19 LAB — KAPPA/LAMBDA LIGHT CHAINS
Kappa free light chain: 18 mg/L (ref 3.3–19.4)
Kappa, lambda light chain ratio: 1.08 (ref 0.26–1.65)
Lambda free light chains: 16.7 mg/L (ref 5.7–26.3)

## 2022-09-21 LAB — PROTEIN ELECTROPHORESIS, SERUM, WITH REFLEX
A/G Ratio: 1.4 (ref 0.7–1.7)
Albumin ELP: 3.2 g/dL (ref 2.9–4.4)
Alpha-1-Globulin: 0.2 g/dL (ref 0.0–0.4)
Alpha-2-Globulin: 0.7 g/dL (ref 0.4–1.0)
Beta Globulin: 0.9 g/dL (ref 0.7–1.3)
Gamma Globulin: 0.5 g/dL (ref 0.4–1.8)
Globulin, Total: 2.3 g/dL (ref 2.2–3.9)
Total Protein ELP: 5.5 g/dL — ABNORMAL LOW (ref 6.0–8.5)

## 2022-10-02 DIAGNOSIS — H2511 Age-related nuclear cataract, right eye: Secondary | ICD-10-CM | POA: Diagnosis not present

## 2022-10-02 DIAGNOSIS — H4922 Sixth [abducent] nerve palsy, left eye: Secondary | ICD-10-CM | POA: Diagnosis not present

## 2022-10-02 DIAGNOSIS — H532 Diplopia: Secondary | ICD-10-CM | POA: Diagnosis not present

## 2022-10-02 DIAGNOSIS — H538 Other visual disturbances: Secondary | ICD-10-CM | POA: Diagnosis not present

## 2022-10-06 ENCOUNTER — Ambulatory Visit: Payer: Medicare Other | Admitting: Podiatry

## 2022-10-09 NOTE — Progress Notes (Signed)
Remote ICD transmission.   

## 2022-10-12 ENCOUNTER — Other Ambulatory Visit: Payer: Self-pay

## 2022-10-12 DIAGNOSIS — H532 Diplopia: Secondary | ICD-10-CM | POA: Diagnosis not present

## 2022-10-12 DIAGNOSIS — Z111 Encounter for screening for respiratory tuberculosis: Secondary | ICD-10-CM | POA: Diagnosis not present

## 2022-10-13 LAB — C-REACTIVE PROTEIN: CRP: 1 mg/L (ref 0–10)

## 2022-10-13 LAB — SEDIMENTATION RATE: Sed Rate: 25 mm/hr (ref 0–30)

## 2022-10-16 LAB — QUANTIFERON-TB GOLD PLUS
QuantiFERON Mitogen Value: 5.29 IU/mL
QuantiFERON Nil Value: 0 IU/mL
QuantiFERON TB1 Ag Value: 0.07 IU/mL
QuantiFERON TB2 Ag Value: 0.07 IU/mL
QuantiFERON-TB Gold Plus: NEGATIVE

## 2022-11-13 DIAGNOSIS — H2511 Age-related nuclear cataract, right eye: Secondary | ICD-10-CM | POA: Diagnosis not present

## 2022-11-13 DIAGNOSIS — H532 Diplopia: Secondary | ICD-10-CM | POA: Diagnosis not present

## 2022-11-13 DIAGNOSIS — H4922 Sixth [abducent] nerve palsy, left eye: Secondary | ICD-10-CM | POA: Diagnosis not present

## 2022-11-30 ENCOUNTER — Telehealth: Payer: Self-pay | Admitting: Internal Medicine

## 2022-11-30 ENCOUNTER — Other Ambulatory Visit: Payer: Self-pay | Admitting: Internal Medicine

## 2022-11-30 MED ORDER — PREDNISONE 10 MG (21) PO TBPK: ORAL_TABLET | ORAL | 0 refills | Status: DC

## 2022-12-08 NOTE — Telephone Encounter (Signed)
Pt requested prednisone for gout flare. Rx sent.

## 2022-12-12 ENCOUNTER — Ambulatory Visit (INDEPENDENT_AMBULATORY_CARE_PROVIDER_SITE_OTHER): Payer: Medicare Other

## 2022-12-12 DIAGNOSIS — I428 Other cardiomyopathies: Secondary | ICD-10-CM

## 2022-12-13 LAB — CUP PACEART REMOTE DEVICE CHECK
Battery Remaining Longevity: 23 mo
Battery Remaining Percentage: 27 %
Battery Voltage: 2.83 V
Date Time Interrogation Session: 20240319020035
HighPow Impedance: 59 Ohm
HighPow Impedance: 59 Ohm
Implantable Lead Connection Status: 753985
Implantable Lead Connection Status: 753985
Implantable Lead Connection Status: 753985
Implantable Lead Implant Date: 20170525
Implantable Lead Implant Date: 20170525
Implantable Lead Implant Date: 20170525
Implantable Lead Location: 753858
Implantable Lead Location: 753859
Implantable Lead Location: 753860
Implantable Lead Model: 7122
Implantable Pulse Generator Implant Date: 20170525
Lead Channel Impedance Value: 410 Ohm
Lead Channel Impedance Value: 480 Ohm
Lead Channel Impedance Value: 650 Ohm
Lead Channel Pacing Threshold Amplitude: 0.5 V
Lead Channel Pacing Threshold Amplitude: 1.25 V
Lead Channel Pacing Threshold Pulse Width: 0.5 ms
Lead Channel Pacing Threshold Pulse Width: 0.5 ms
Lead Channel Sensing Intrinsic Amplitude: 0.2 mV
Lead Channel Sensing Intrinsic Amplitude: 11.4 mV
Lead Channel Setting Pacing Amplitude: 2 V
Lead Channel Setting Pacing Amplitude: 2.5 V
Lead Channel Setting Pacing Pulse Width: 0.5 ms
Lead Channel Setting Pacing Pulse Width: 0.5 ms
Lead Channel Setting Sensing Sensitivity: 0.5 mV
Pulse Gen Serial Number: 7357926
Zone Setting Status: 755011

## 2022-12-18 ENCOUNTER — Other Ambulatory Visit: Payer: Self-pay | Admitting: *Deleted

## 2022-12-18 MED ORDER — FUROSEMIDE 80 MG PO TABS: ORAL_TABLET | ORAL | 0 refills | Status: DC

## 2022-12-26 DIAGNOSIS — Z9889 Other specified postprocedural states: Secondary | ICD-10-CM | POA: Diagnosis not present

## 2022-12-26 DIAGNOSIS — Z952 Presence of prosthetic heart valve: Secondary | ICD-10-CM | POA: Diagnosis not present

## 2022-12-26 DIAGNOSIS — I088 Other rheumatic multiple valve diseases: Secondary | ICD-10-CM | POA: Diagnosis not present

## 2022-12-26 DIAGNOSIS — Z953 Presence of xenogenic heart valve: Secondary | ICD-10-CM | POA: Diagnosis not present

## 2022-12-26 DIAGNOSIS — I7789 Other specified disorders of arteries and arterioles: Secondary | ICD-10-CM | POA: Diagnosis not present

## 2022-12-26 DIAGNOSIS — Z8679 Personal history of other diseases of the circulatory system: Secondary | ICD-10-CM | POA: Diagnosis not present

## 2022-12-26 DIAGNOSIS — R918 Other nonspecific abnormal finding of lung field: Secondary | ICD-10-CM | POA: Diagnosis not present

## 2023-01-01 ENCOUNTER — Ambulatory Visit: Payer: Medicare Other | Admitting: Internal Medicine

## 2023-01-11 ENCOUNTER — Telehealth (HOSPITAL_BASED_OUTPATIENT_CLINIC_OR_DEPARTMENT_OTHER): Payer: Self-pay | Admitting: *Deleted

## 2023-01-11 ENCOUNTER — Other Ambulatory Visit: Payer: Self-pay

## 2023-01-11 MED ORDER — DAPAGLIFLOZIN PROPANEDIOL 10 MG PO TABS: 10.0000 mg | ORAL_TABLET | Freq: Every day | ORAL | 5 refills | Status: DC

## 2023-01-11 NOTE — Telephone Encounter (Signed)
Received notification from covermymeds  #[id number]  Will forward to prior auth team

## 2023-01-12 ENCOUNTER — Encounter: Payer: Self-pay | Admitting: Hematology & Oncology

## 2023-01-12 ENCOUNTER — Ambulatory Visit (INDEPENDENT_AMBULATORY_CARE_PROVIDER_SITE_OTHER): Payer: Medicare Other | Admitting: Podiatry

## 2023-01-12 ENCOUNTER — Other Ambulatory Visit (HOSPITAL_COMMUNITY): Payer: Self-pay

## 2023-01-12 ENCOUNTER — Encounter: Payer: Self-pay | Admitting: Podiatry

## 2023-01-12 DIAGNOSIS — M79674 Pain in right toe(s): Secondary | ICD-10-CM

## 2023-01-12 DIAGNOSIS — L608 Other nail disorders: Secondary | ICD-10-CM

## 2023-01-12 DIAGNOSIS — B351 Tinea unguium: Secondary | ICD-10-CM

## 2023-01-12 DIAGNOSIS — M79675 Pain in left toe(s): Secondary | ICD-10-CM

## 2023-01-12 DIAGNOSIS — E119 Type 2 diabetes mellitus without complications: Secondary | ICD-10-CM

## 2023-01-12 NOTE — Progress Notes (Signed)
This patient returns to my office for at risk foot care.  This patient requires this care by a professional since this patient will be at risk due to having diabetes type 2, chronic kidney disease and coagulation defect.  Patient is taking eliquisThis patient is unable to cut nails himself since the patient cannot reach his nails.These nails are painful walking and wearing shoes.  This patient presents for at risk foot care today. ? ?General Appearance  Alert, conversant and in no acute stress. ? ?Vascular  Dorsalis pedis and posterior tibial  pulses are palpable  bilaterally.  Capillary return is within normal limits  bilaterally. Temperature is within normal limits  bilaterally. ? ?Neurologic  Senn-Weinstein monofilament wire test within normal limits  bilaterally. Muscle power within normal limits bilaterally. ? ?Nails Thick disfigured discolored nails with subungual debris  from hallux to fifth toes bilaterally. No evidence of bacterial infection or drainage bilaterally. ? ?Orthopedic  No limitations of motion  feet .  No crepitus or effusions noted.  No bony pathology or digital deformities noted. ? ?Skin  normotropic skin with no porokeratosis noted bilaterally.  No signs of infections or ulcers noted.    ? ?Onychomycosis  Pain in right toes  Pain in left toes ? ?Consent was obtained for treatment procedures.   Mechanical debridement of nails 1-5  bilaterally performed with a nail nipper.  Filed with dremel without incident.  ? ? ?Return office visit   10 weeks                   Told patient to return for periodic foot care and evaluation due to potential at risk complications. ? ? ?Cadan Maggart DPM  ?

## 2023-01-12 NOTE — Telephone Encounter (Signed)
Per test claim no PA is required.   Claims ran for 30 and 90 day supply, both approved

## 2023-01-23 NOTE — Progress Notes (Signed)
Remote ICD transmission.   

## 2023-01-26 ENCOUNTER — Inpatient Hospital Stay (HOSPITAL_BASED_OUTPATIENT_CLINIC_OR_DEPARTMENT_OTHER): Payer: Medicare Other | Admitting: Hematology & Oncology

## 2023-01-26 ENCOUNTER — Inpatient Hospital Stay: Payer: Medicare Other | Attending: Hematology & Oncology

## 2023-01-26 ENCOUNTER — Telehealth: Payer: Self-pay | Admitting: *Deleted

## 2023-01-26 ENCOUNTER — Encounter: Payer: Self-pay | Admitting: Hematology & Oncology

## 2023-01-26 VITALS — BP 124/79 | HR 97 | Temp 98.6°F | Resp 20 | Ht 69.0 in | Wt 191.1 lb

## 2023-01-26 DIAGNOSIS — G629 Polyneuropathy, unspecified: Secondary | ICD-10-CM | POA: Diagnosis not present

## 2023-01-26 DIAGNOSIS — C9000 Multiple myeloma not having achieved remission: Secondary | ICD-10-CM | POA: Diagnosis not present

## 2023-01-26 DIAGNOSIS — E119 Type 2 diabetes mellitus without complications: Secondary | ICD-10-CM | POA: Insufficient documentation

## 2023-01-26 DIAGNOSIS — Z79899 Other long term (current) drug therapy: Secondary | ICD-10-CM | POA: Insufficient documentation

## 2023-01-26 DIAGNOSIS — M7989 Other specified soft tissue disorders: Secondary | ICD-10-CM | POA: Diagnosis not present

## 2023-01-26 LAB — CBC WITH DIFFERENTIAL (CANCER CENTER ONLY)
Abs Immature Granulocytes: 0.03 10*3/uL (ref 0.00–0.07)
Basophils Absolute: 0 10*3/uL (ref 0.0–0.1)
Basophils Relative: 1 %
Eosinophils Absolute: 0.2 10*3/uL (ref 0.0–0.5)
Eosinophils Relative: 4 %
HCT: 42 % (ref 39.0–52.0)
Hemoglobin: 13.7 g/dL (ref 13.0–17.0)
Immature Granulocytes: 1 %
Lymphocytes Relative: 13 %
Lymphs Abs: 0.7 10*3/uL (ref 0.7–4.0)
MCH: 30.9 pg (ref 26.0–34.0)
MCHC: 32.6 g/dL (ref 30.0–36.0)
MCV: 94.8 fL (ref 80.0–100.0)
Monocytes Absolute: 0.9 10*3/uL (ref 0.1–1.0)
Monocytes Relative: 16 %
Neutro Abs: 3.6 10*3/uL (ref 1.7–7.7)
Neutrophils Relative %: 65 %
Platelet Count: 123 10*3/uL — ABNORMAL LOW (ref 150–400)
RBC: 4.43 MIL/uL (ref 4.22–5.81)
RDW: 15.4 % (ref 11.5–15.5)
WBC Count: 5.5 10*3/uL (ref 4.0–10.5)
nRBC: 0 % (ref 0.0–0.2)

## 2023-01-26 LAB — CMP (CANCER CENTER ONLY)
ALT: 23 U/L (ref 0–44)
AST: 24 U/L (ref 15–41)
Albumin: 3.4 g/dL — ABNORMAL LOW (ref 3.5–5.0)
Alkaline Phosphatase: 75 U/L (ref 38–126)
Anion gap: 7 (ref 5–15)
BUN: 41 mg/dL — ABNORMAL HIGH (ref 8–23)
CO2: 27 mmol/L (ref 22–32)
Calcium: 8.4 mg/dL — ABNORMAL LOW (ref 8.9–10.3)
Chloride: 105 mmol/L (ref 98–111)
Creatinine: 2.22 mg/dL — ABNORMAL HIGH (ref 0.61–1.24)
GFR, Estimated: 31 mL/min — ABNORMAL LOW (ref >60)
Glucose, Bld: 142 mg/dL — ABNORMAL HIGH (ref 70–99)
Potassium: 4 mmol/L (ref 3.5–5.1)
Sodium: 139 mmol/L (ref 135–145)
Total Bilirubin: 0.7 mg/dL (ref 0.3–1.2)
Total Protein: 5.8 g/dL — ABNORMAL LOW (ref 6.5–8.1)

## 2023-01-26 LAB — LACTATE DEHYDROGENASE: LDH: 233 U/L — ABNORMAL HIGH (ref 98–192)

## 2023-01-26 NOTE — Progress Notes (Signed)
Hematology and Oncology Follow Up Visit  Eric [name]., MD [id number] November 08, 1950 72 y.o. 01/26/2023   Principle Diagnosis:  IgG kappa myeloma  - 1q+   Current Therapy:        CyBorD -- s/p cycle  #18 - started on 06/04/2020   Interim History:  [name] is here today for follow-up.  He has not seen his for about 5 months.  He has been quite busy.  He is busy taking care of his wife who, unfortunate, was back in the hospital with pneumonia.  She I think is also on dialysis.  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  He probably operates 1 or 2 times a week.  There has been no problems with nausea or vomiting.  He has had no change in bowel or bladder habits.  He has had no bleeding.  There is been no rashes.  When we last saw him, his monoclonal spike was found.  His IgG level was 596 mg/dL.  His Kappa light chain was 1.8 mg/dL.  He has had no problem with infections.  He has had no bleeding.  He has had no mouth sores.  There is been no headache.  He does have neuropathy.  He does have diabetes.  I had to mention that he said that he did have a gout attack recently.  Overall, I would say his performance status is probably ECOG 1.    Medications:  Allergies as of 01/26/2023       Reactions   Lactose Intolerance (gi) Diarrhea        Medication List        Accurate as of Jan 26, 2023  2:19 PM. If you have any questions, ask your nurse or doctor.          STOP taking these medications    predniSONE 10 MG (21) Tbpk tablet Commonly known as: STERAPRED UNI-PAK 21 TAB Stopped by: Josph Macho, MD       TAKE these medications    acetaminophen 500 MG tablet Commonly known as: TYLENOL Take 1,000 mg by mouth every 4 (four) hours as needed for moderate pain or headache.   allopurinol 100 MG tablet Commonly known as: ZYLOPRIM Take 1 tab by mouth every other day   amiodarone 100 MG tablet Commonly known as: PACERONE TAKE 1  TABLET(100 MG) BY MOUTH DAILY   apixaban 5 MG Tabs tablet Commonly known as: Eliquis Take 1 tablet (5 mg total) by mouth 2 (two) times daily.   ARTIFICIAL TEARS OP Place 1 drop into both eyes daily as needed (dry eyes).   ascorbic acid 500 MG tablet Commonly known as: VITAMIN C Take 500 mg by mouth daily.   carvedilol 6.25 MG tablet Commonly known as: COREG Take 1 tablet by mouth twice a day   dapagliflozin propanediol 10 MG Tabs tablet Commonly known as: Farxiga Take 1 tablet (10 mg total) by mouth daily before breakfast.   famciclovir 500 MG tablet Commonly known as: FAMVIR Take 1 tablet (500 mg total) by mouth daily.   ferrous sulfate 324 (65 Fe) MG Tbec Take 1 tablet by mouth daily.   fluticasone 50 MCG/ACT nasal spray Commonly known as: FLONASE Place 2 sprays into both nostrils daily.   furosemide 80 MG tablet Commonly known as: LASIX TAKE 1 TABLET(80 MG) BY MOUTH DAILY   isosorbide-hydrALAZINE 20-37.5 MG tablet Commonly known as: BIDIL Take 1 tablet by mouth 3 (three) times daily.  levothyroxine 75 MCG tablet Commonly known as: SYNTHROID Take 1 tablet (75 mcg total) by mouth daily before breakfast.   loperamide 2 MG capsule Commonly known as: IMODIUM Take 2 mg by mouth as needed for diarrhea or loose stools.   potassium chloride 20 MEQ/15ML (10%) Soln TAKE 15 ML BY MOUTH TWICE DAILY What changed: See the new instructions.   rosuvastatin 20 MG tablet Commonly known as: CRESTOR TAKE 1 TABLET(20 MG) BY MOUTH DAILY   vitamin B-12 100 MCG tablet Commonly known as: CYANOCOBALAMIN Take 100 mcg by mouth daily.   VITAMIN D PO Take 1 capsule by mouth daily.        Allergies:  Allergies  Allergen Reactions   Lactose Intolerance (Gi) Diarrhea    Past Medical History, Surgical history, Social history, and Family History were reviewed and updated.  Review of Systems: Review of Systems  Constitutional: Negative.   Eyes: Negative.   Respiratory:  Negative.    Cardiovascular:  Positive for leg swelling.  Gastrointestinal: Negative.   Genitourinary: Negative.   Musculoskeletal: Negative.   Skin: Negative.   Neurological: Negative.   Endo/Heme/Allergies: Negative.   Psychiatric/Behavioral: Negative.       Physical Exam:  height is 5\' 9"  (1.753 m) and weight is 191 lb 1.9 oz (86.7 kg). His oral temperature is 98.6 F (37 C). His blood pressure is 124/79 and his pulse is 97. His respiration is 20 and oxygen saturation is 98%.   Wt Readings from Last 3 Encounters:  01/26/23 191 lb 1.9 oz (86.7 kg)  09/15/22 197 lb 1.9 oz (89.4 kg)  08/31/22 201 lb 12.8 oz (91.5 kg)    Physical Exam Vitals reviewed.  HENT:     Head: Normocephalic and atraumatic.  Eyes:     Pupils: Pupils are equal, round, and reactive to light.  Cardiovascular:     Rate and Rhythm: Normal rate and regular rhythm.     Heart sounds: Normal heart sounds.  Pulmonary:     Effort: Pulmonary effort is normal.     Breath sounds: Normal breath sounds.  Abdominal:     General: Bowel sounds are normal.     Palpations: Abdomen is soft.  Musculoskeletal:        General: No tenderness or deformity. Normal range of motion.     Cervical back: Normal range of motion.  Lymphadenopathy:     Cervical: No cervical adenopathy.  Skin:    General: Skin is warm and dry.     Findings: No erythema or rash.  Neurological:     Mental Status: He is alert and oriented to person, place, and time.  Psychiatric:        Behavior: Behavior normal.        Thought Content: Thought content normal.        Judgment: Judgment normal.     Lab Results  Component Value Date   WBC 5.5 01/26/2023   HGB 13.7 01/26/2023   HCT 42.0 01/26/2023   MCV 94.8 01/26/2023   PLT 123 (L) 01/26/2023   Lab Results  Component Value Date   FERRITIN 56 09/15/2022   IRON 92 09/15/2022   TIBC 349 09/15/2022   UIBC 257 09/15/2022   IRONPCTSAT 26 09/15/2022   Lab Results  Component Value Date    RETICCTPCT 1.4 09/15/2022   RBC 4.43 01/26/2023   Lab Results  Component Value Date   KPAFRELGTCHN 18.0 09/15/2022   LAMBDASER 16.7 09/15/2022   KAPLAMBRATIO 1.08 09/15/2022   Lab Results  Component Value Date   IGGSERUM 596 (L) 09/15/2022   IGA 94 09/15/2022   IGMSERUM 13 (L) 09/15/2022   Lab Results  Component Value Date   TOTALPROTELP 5.5 (L) 09/15/2022   ALBUMINELP 3.2 09/15/2022   A1GS 0.2 09/15/2022   A2GS 0.7 09/15/2022   BETS 0.9 09/15/2022   GAMS 0.5 09/15/2022   MSPIKE Not Observed 09/15/2022   SPEI Comment 04/22/2021     Chemistry      Component Value Date/Time   NA 139 01/26/2023 1330   NA 144 08/25/2022 1050   K 4.0 01/26/2023 1330   CL 105 01/26/2023 1330   CO2 27 01/26/2023 1330   BUN 41 (H) 01/26/2023 1330   BUN 37 (H) 08/25/2022 1050   CREATININE 2.22 (H) 01/26/2023 1330   CREATININE 1.95 (H) 09/08/2016 0859      Component Value Date/Time   CALCIUM 8.4 (L) 01/26/2023 1330   ALKPHOS 75 01/26/2023 1330   AST 24 01/26/2023 1330   ALT 23 01/26/2023 1330   BILITOT 0.7 01/26/2023 1330       Impression and Plan: Mr. Flewellen is a very nice 72 yo African American gentleman with IgG kappa myeloma.  He has done incredibly well with treatment.  His blood counts look fantastic.  It is been 5 months he is he really has had any treatment.  His quality of life is still doing quite well.  He is able to help take care of his poor wife.  For right now, I think we just have to see what his myeloma levels look like.  Hopefully, they really have not changed.  If not, we can probably get him back onto our protocol.  He was basic and treated monthly.  This works well for him.  It worked on the myeloma.  It was a "win win" situation for him.  I will see him back after Memorial Day.    Josph Macho, MD 5/3/20242:19 PM

## 2023-01-26 NOTE — Telephone Encounter (Signed)
Per 01/26/23 los - patient did not want to schedule treatment until he comes back in to find out.

## 2023-01-27 LAB — BETA 2 MICROGLOBULIN, SERUM: Beta-2 Microglobulin: 4.5 mg/L — ABNORMAL HIGH (ref 0.6–2.4)

## 2023-01-28 LAB — IGG, IGA, IGM
IgA: 87 mg/dL (ref 61–437)
IgG (Immunoglobin G), Serum: 500 mg/dL — ABNORMAL LOW (ref 603–1613)
IgM (Immunoglobulin M), Srm: 19 mg/dL (ref 15–143)

## 2023-01-29 ENCOUNTER — Ambulatory Visit (INDEPENDENT_AMBULATORY_CARE_PROVIDER_SITE_OTHER): Payer: Medicare Other | Admitting: Internal Medicine

## 2023-01-29 ENCOUNTER — Encounter: Payer: Self-pay | Admitting: Internal Medicine

## 2023-01-29 DIAGNOSIS — M79671 Pain in right foot: Secondary | ICD-10-CM

## 2023-01-29 DIAGNOSIS — M79672 Pain in left foot: Secondary | ICD-10-CM | POA: Diagnosis not present

## 2023-01-29 DIAGNOSIS — I5022 Chronic systolic (congestive) heart failure: Secondary | ICD-10-CM | POA: Diagnosis not present

## 2023-01-29 DIAGNOSIS — E032 Hypothyroidism due to medicaments and other exogenous substances: Secondary | ICD-10-CM

## 2023-01-29 DIAGNOSIS — E1122 Type 2 diabetes mellitus with diabetic chronic kidney disease: Secondary | ICD-10-CM | POA: Diagnosis not present

## 2023-01-29 DIAGNOSIS — N1832 Chronic kidney disease, stage 3b: Secondary | ICD-10-CM | POA: Diagnosis not present

## 2023-01-29 DIAGNOSIS — I129 Hypertensive chronic kidney disease with stage 1 through stage 4 chronic kidney disease, or unspecified chronic kidney disease: Secondary | ICD-10-CM

## 2023-01-29 DIAGNOSIS — E782 Mixed hyperlipidemia: Secondary | ICD-10-CM

## 2023-01-29 LAB — KAPPA/LAMBDA LIGHT CHAINS
Kappa free light chain: 24.6 mg/L — ABNORMAL HIGH (ref 3.3–19.4)
Kappa, lambda light chain ratio: 1.26 (ref 0.26–1.65)
Lambda free light chains: 19.6 mg/L (ref 5.7–26.3)

## 2023-01-29 MED ORDER — TRIAMCINOLONE ACETONIDE 40 MG/ML IJ SUSP
40.0000 mg | Freq: Once | INTRAMUSCULAR | Status: AC
Start: 2023-01-29 — End: 2023-01-29
  Administered 2023-01-29: 40 mg via INTRAMUSCULAR

## 2023-01-29 NOTE — Patient Instructions (Signed)

## 2023-01-29 NOTE — Progress Notes (Signed)
I,[name],acting as a scribe for Gwynneth Aliment, MD.,have documented all relevant documentation on the behalf of Gwynneth Aliment, MD,as directed by  Gwynneth Aliment, MD while in the presence of Gwynneth Aliment, MD.    Subjective:     Patient ID: Eric Divine., MD , male    DOB: 1951/08/18 , 72 y.o.   MRN: [id number]   Chief Complaint  Patient presents with  . Diabetes  . Hypertension  . Hyperlipidemia  . Hypothyroidism    HPI  The patient is here for a follow-up on his diabetes and blood pressure & chol. He reports compliance with BP/heart failure meds. Denies headaches, cp.   He reports trying to figure out pain in right & left feet. He does not know if this pain has something to deal with gout flare up.   He has no specific questions or concerns.       Diabetes He presents for his follow-up diabetic visit. He has type 2 diabetes mellitus. There are no hypoglycemic associated symptoms. Pertinent negatives for diabetes include no blurred vision and no chest pain. There are no hypoglycemic complications. There are no diabetic complications. Pertinent negatives for diabetic complications include no CVA. Risk factors for coronary artery disease include obesity and sedentary lifestyle. Current diabetic treatment includes oral agent (dual therapy). He is following a diabetic diet. Meal planning includes avoidance of concentrated sweets. He participates in exercise intermittently. His breakfast blood glucose is taken between 8-9 am. His breakfast blood glucose range is generally 110-130 mg/dl. An ACE inhibitor/angiotensin II receptor blocker is not being taken. He sees a podiatrist.Eye exam is not current.  Hypertension This is a chronic problem. The current episode started more than 1 year ago. The problem has been gradually improving since onset. The problem is controlled. Pertinent negatives include no blurred vision, chest pain, palpitations or shortness of breath. Risk  factors for coronary artery disease include diabetes mellitus, dyslipidemia, sedentary lifestyle, obesity, male gender and stress. Past treatments include beta blockers and diuretics. The current treatment provides moderate improvement. Hypertensive end-organ damage includes kidney disease. There is no history of CVA.  Hyperlipidemia Pertinent negatives include no chest pain or shortness of breath.     Past Medical History:  Diagnosis Date  . Acute blood loss anemia   . Acute encephalopathy   . Acute hypoxemic respiratory failure (HCC)   . Acute idiopathic gout of right ankle   . Acute lumbar back pain   . Acute on chronic systolic heart failure (HCC)   . Acute respiratory failure (HCC)   . AKI (acute kidney injury) (HCC)   . Altered mental status   . Aortic aneurysm without rupture (HCC) 02/09/2016  . Aortic root enlargement (HCC) 02/13/2012  . Aortic valve regurgitation, acquired 02/13/2012  . Arrhythmia   . Atrial flutter (HCC) 03/18/2012  . Back pain   . Cardiac arrest (HCC) 02/01/2016  . CHF (congestive heart failure) (HCC)   . Chronic anticoagulation 03/04/2013  . Chronic kidney disease    kidney fx studies increased   . Chronic lower back pain   . Chronic renal insufficiency   . Chronic systolic heart failure (HCC) 02/13/2012   Recent diagnosis 4 / 2013, LVEF 25% by Echo 12/2011  03/2013: Echo at [facility] Conclusions: 1. Left ventricular ejection fraction estimated by 2D at 40-45 percent. 2. Mild concentric left ventricular hypertrophy. 3. Mild left atrial enlargement. 4. Moderate aortic valve regurgitation. 5. The aortic root at the sinus(es)  of valsalva is moderately dilated 6. Mild mitral valve regurgitation. 7.  . CKD (chronic kidney disease)   . CKD (chronic kidney disease), stage IV (HCC) 08/06/2013   Creatinine 2.4 on 07/04/13   . Claustrophobia   . Colon cancer screening 03/04/2013  . Debility 02/22/2016  . Diabetes mellitus type 2 in nonobese (HCC)   . Diabetes  mellitus type 2 in obese   . Dysrhythmia    "palpitations"  . Encounter for central line placement   . Exertional dyspnea 01/2012  . 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 Goals of care, counseling/discussion 05/05/2020  . HCAP (healthcare-associated pneumonia)   . Heart murmur   . Hyperlipidemia 02/13/2012  . Hypertension   . Hypothyroidism   . Internal hemorrhoids without mention of complication 04/11/2013  . Labile blood pressure   . Left bundle branch block 02/13/2012  . Leg weakness, bilateral   . Long term (current) use of anticoagulants 08/06/2013   Eliquis therapy   . Long term current use of amiodarone 08/07/2016  . Lower extremity weakness   . Migraine 02/13/12   "opthalmic"  . Multiple myeloma (HCC) 05/05/2020  . Non-traumatic rhabdomyolysis   . Obesity (BMI 30-39.9) 02/13/2012  . Pain   . Paroxysmal atrial fibrillation (HCC)   . Pneumonia   . Retroperitoneal bleed   . Right ankle pain   . Right knee pain   . Severe aortic regurgitation 02/09/2016  . Special screening for malignant neoplasms, colon 04/11/2013  . Thyroid activity decreased   . Tibial pain   . Varicose vein of leg    right  . Ventricular fibrillation (HCC) 02/09/2016  . Weakness of both lower extremities      Family History  Problem Relation Age of Onset  . Hypertension Mother   . Heart disease Mother   . Heart failure Mother   . Diabetes Mother   . Hypertension Father   . Heart disease Father   . Heart failure Father      Current Outpatient Medications:  .  acetaminophen (TYLENOL) 500 MG tablet, Take 1,000 mg by mouth every 4 (four) hours as needed for moderate pain or headache., Disp: , Rfl:  .  allopurinol (ZYLOPRIM) 100 MG tablet, Take 1 tab by mouth every other day, Disp: 45 tablet, Rfl: 1 .  amiodarone (PACERONE) 100 MG tablet, TAKE 1 TABLET(100 MG) BY MOUTH DAILY, Disp: 90 tablet, Rfl: 3 .  apixaban (ELIQUIS) 5 MG TABS tablet, Take 1 tablet (5 mg total) by mouth 2 (two)  times daily., Disp: 60 tablet, Rfl: 5 .  carvedilol (COREG) 6.25 MG tablet, Take 1 tablet by mouth twice a day, Disp: 180 tablet, Rfl: 1 .  dapagliflozin propanediol (FARXIGA) 10 MG TABS tablet, Take 1 tablet (10 mg total) by mouth daily before breakfast., Disp: 30 tablet, Rfl: 5 .  fluticasone (FLONASE) 50 MCG/ACT nasal spray, Place 2 sprays into both nostrils daily., Disp: 16 g, Rfl: 2 .  furosemide (LASIX) 80 MG tablet, TAKE 1 TABLET(80 MG) BY MOUTH DAILY, Disp: 90 tablet, Rfl: 0 .  Hypromellose (ARTIFICIAL TEARS OP), Place 1 drop into both eyes daily as needed (dry eyes)., Disp: , Rfl:  .  levothyroxine (SYNTHROID) 75 MCG tablet, Take 1 tablet (75 mcg total) by mouth daily before breakfast., Disp: 90 tablet, Rfl: 1 .  loperamide (IMODIUM) 2 MG capsule, Take 2 mg by mouth as needed for diarrhea or loose stools., Disp: , Rfl:  .  potassium chloride 20  MEQ/15ML (10%) SOLN, TAKE 15 ML BY MOUTH TWICE DAILY (Patient taking differently: daily.), Disp: 946 mL, Rfl: 1 .  rosuvastatin (CRESTOR) 20 MG tablet, TAKE 1 TABLET(20 MG) BY MOUTH DAILY, Disp: 90 tablet, Rfl: 2 .  vitamin B-12 (CYANOCOBALAMIN) 100 MCG tablet, Take 100 mcg by mouth daily., Disp: , Rfl:  .  vitamin C (ASCORBIC ACID) 500 MG tablet, Take 500 mg by mouth daily., Disp: , Rfl:  .  VITAMIN D PO, Take 1 capsule by mouth daily., Disp: , Rfl:  .  famciclovir (FAMVIR) 500 MG tablet, Take 1 tablet (500 mg total) by mouth daily. (Patient not taking: Reported on 01/26/2023), Disp: 30 tablet, Rfl: 3 .  ferrous sulfate 324 (65 Fe) MG TBEC, Take 1 tablet by mouth daily. (Patient not taking: Reported on 01/26/2023), Disp: , Rfl:  .  isosorbide-hydrALAZINE (BIDIL) 20-37.5 MG tablet, Take 1 tablet by mouth 3 (three) times daily. (Patient not taking: Reported on 01/26/2023), Disp: 270 tablet, Rfl: 3   Allergies  Allergen Reactions  . Lactose Intolerance (Gi) Diarrhea     Review of Systems  Constitutional: Negative.   HENT: Negative.    Eyes:  Negative  for blurred vision.  Respiratory:  Negative for shortness of breath.   Cardiovascular: Negative.  Negative for chest pain and palpitations.  Endocrine: Negative.   Genitourinary: Negative.   Skin: Negative.   Allergic/Immunologic: Negative.   Hematological: Negative.      Today's Vitals   01/29/23 1620 01/29/23 1647  BP: (!) 132/104 (!) 138/98  Pulse: 73   Temp: 98 F (36.7 C)   SpO2: 98%   Weight: 192 lb 12.8 oz (87.5 kg)   Height: 5\' 9"  (1.753 m)    Body mass index is 28.47 kg/m.  Wt Readings from Last 3 Encounters:  01/29/23 192 lb 12.8 oz (87.5 kg)  01/26/23 191 lb 1.9 oz (86.7 kg)  09/15/22 197 lb 1.9 oz (89.4 kg)    Objective:  Physical Exam Vitals and nursing note reviewed.  Constitutional:      Appearance: Normal appearance.  HENT:     Head: Normocephalic and atraumatic.  Eyes:     Extraocular Movements: Extraocular movements intact.  Cardiovascular:     Rate and Rhythm: Normal rate and regular rhythm.     Heart sounds: Normal heart sounds.  Pulmonary:     Effort: Pulmonary effort is normal.     Breath sounds: Normal breath sounds.  Musculoskeletal:     Cervical back: Normal range of motion.  Skin:    General: Skin is warm.  Neurological:     General: No focal deficit present.     Mental Status: He is alert.  Psychiatric:        Mood and Affect: Mood normal.     Assessment And Plan:     1. Type 2 diabetes mellitus with stage 3b chronic kidney disease, without long-term current use of insulin (HCC) - Ambulatory referral to Nephrology - Hemoglobin A1c  2. Hypertensive nephropathy  3. Hypothyroidism due to medication - TSH + free T4  4. Mixed hyperlipidemia  5. Bilateral foot pain - ANA, IFA (with reflex) - CYCLIC CITRUL PEPTIDE ANTIBODY, IGG/IGA - Rheumatoid factor - Sedimentation rate - Uric acid   Patient was given opportunity to ask questions. Patient verbalized understanding of the plan and was able to repeat key elements of the plan.  All questions were answered to their satisfaction.   I, Gwynneth Aliment, MD, have reviewed all documentation for this  visit. The documentation on 01/29/23 for the exam, diagnosis, procedures, and orders are all accurate and complete.   IF YOU HAVE BEEN REFERRED TO A SPECIALIST, IT MAY TAKE 1-2 WEEKS TO SCHEDULE/PROCESS THE REFERRAL. IF YOU HAVE NOT HEARD FROM US/SPECIALIST IN TWO WEEKS, PLEASE GIVE [place] A CALL AT [phone].   THE PATIENT IS ENCOURAGED TO PRACTICE SOCIAL DISTANCING DUE TO THE COVID-19 PANDEMIC.

## 2023-01-30 ENCOUNTER — Other Ambulatory Visit: Payer: Self-pay | Admitting: Internal Medicine

## 2023-01-30 LAB — PROTEIN ELECTROPHORESIS, SERUM, WITH REFLEX
A/G Ratio: 1.5 (ref 0.7–1.7)
Albumin ELP: 3.1 g/dL (ref 2.9–4.4)
Alpha-1-Globulin: 0.2 g/dL (ref 0.0–0.4)
Alpha-2-Globulin: 0.6 g/dL (ref 0.4–1.0)
Beta Globulin: 0.9 g/dL (ref 0.7–1.3)
Gamma Globulin: 0.4 g/dL (ref 0.4–1.8)
Globulin, Total: 2.1 g/dL — ABNORMAL LOW (ref 2.2–3.9)
Total Protein ELP: 5.2 g/dL — ABNORMAL LOW (ref 6.0–8.5)

## 2023-01-30 LAB — MICROALBUMIN / CREATININE URINE RATIO
Creatinine, Urine: 34.6 mg/dL
Microalb/Creat Ratio: 2421 mg/g creat — ABNORMAL HIGH (ref 0–29)
Microalbumin, Urine: 837.5 ug/mL

## 2023-01-30 MED ORDER — TRAMADOL HCL 50 MG PO TABS: 50.0000 mg | ORAL_TABLET | Freq: Four times a day (QID) | ORAL | 0 refills | Status: DC | PRN

## 2023-02-01 LAB — PTH, INTACT AND CALCIUM
Calcium: 9 mg/dL (ref 8.6–10.2)
PTH: 229 pg/mL — ABNORMAL HIGH (ref 15–65)

## 2023-02-01 LAB — PHOSPHORUS: Phosphorus: 2.7 mg/dL — ABNORMAL LOW (ref 2.8–4.1)

## 2023-02-01 LAB — TSH+FREE T4
Free T4: 1.52 ng/dL (ref 0.82–1.77)
TSH: 1.54 u[IU]/mL (ref 0.450–4.500)

## 2023-02-01 LAB — SEDIMENTATION RATE: Sed Rate: 23 mm/hr (ref 0–30)

## 2023-02-01 LAB — ANTINUCLEAR ANTIBODIES, IFA: ANA Titer 1: NEGATIVE

## 2023-02-01 LAB — CYCLIC CITRUL PEPTIDE ANTIBODY, IGG/IGA: Cyclic Citrullin Peptide Ab: 5 units (ref 0–19)

## 2023-02-01 LAB — RHEUMATOID FACTOR: Rheumatoid fact SerPl-aCnc: 10.3 IU/mL (ref <14.0)

## 2023-02-01 LAB — URIC ACID: Uric Acid: 6.7 mg/dL (ref 3.8–8.4)

## 2023-02-01 LAB — HEMOGLOBIN A1C
Est. average glucose Bld gHb Est-mCnc: 186 mg/dL
Hgb A1c MFr Bld: 8.1 % — ABNORMAL HIGH (ref 4.8–5.6)

## 2023-02-01 LAB — BRAIN NATRIURETIC PEPTIDE: BNP: 861.1 pg/mL — ABNORMAL HIGH (ref 0.0–100.0)

## 2023-02-05 ENCOUNTER — Other Ambulatory Visit: Payer: Self-pay | Admitting: Internal Medicine

## 2023-02-13 ENCOUNTER — Ambulatory Visit: Payer: Medicare Other

## 2023-02-13 VITALS — BP 134/82 | HR 78 | Temp 98.1°F | Ht 69.0 in | Wt 192.0 lb

## 2023-02-13 DIAGNOSIS — N1832 Chronic kidney disease, stage 3b: Secondary | ICD-10-CM

## 2023-02-13 NOTE — Progress Notes (Signed)
Patient presents today for ozempic teaching. He will begin with 0.25 weekly. He reports wanting to start on Monday 02/19/23. Dates have been written on sample given for each Monday patient is to complete injection.

## 2023-02-23 ENCOUNTER — Ambulatory Visit (INDEPENDENT_AMBULATORY_CARE_PROVIDER_SITE_OTHER): Payer: Medicare Other | Admitting: Family

## 2023-02-23 ENCOUNTER — Encounter (HOSPITAL_BASED_OUTPATIENT_CLINIC_OR_DEPARTMENT_OTHER): Payer: Self-pay | Admitting: Family

## 2023-02-23 VITALS — BP 136/84 | HR 70 | Ht 69.0 in | Wt 193.9 lb

## 2023-02-23 DIAGNOSIS — I480 Paroxysmal atrial fibrillation: Secondary | ICD-10-CM

## 2023-02-23 DIAGNOSIS — Z95810 Presence of automatic (implantable) cardiac defibrillator: Secondary | ICD-10-CM

## 2023-02-23 DIAGNOSIS — N184 Chronic kidney disease, stage 4 (severe): Secondary | ICD-10-CM

## 2023-02-23 DIAGNOSIS — I5022 Chronic systolic (congestive) heart failure: Secondary | ICD-10-CM

## 2023-02-23 DIAGNOSIS — Z952 Presence of prosthetic heart valve: Secondary | ICD-10-CM

## 2023-02-23 MED ORDER — APIXABAN 5 MG PO TABS: 5.0000 mg | ORAL_TABLET | Freq: Two times a day (BID) | ORAL | 0 refills | Status: DC

## 2023-02-23 MED ORDER — AMIODARONE HCL 100 MG PO TABS: 100.0000 mg | ORAL_TABLET | Freq: Every day | ORAL | 3 refills | Status: DC

## 2023-02-23 MED ORDER — ISOSORB DINITRATE-HYDRALAZINE 20-37.5 MG PO TABS
0.5000 | ORAL_TABLET | Freq: Two times a day (BID) | ORAL | 3 refills | Status: DC
Start: 2023-02-23 — End: 2023-02-26

## 2023-02-23 MED ORDER — FUROSEMIDE 80 MG PO TABS
ORAL_TABLET | ORAL | 3 refills | Status: DC
Start: 2023-02-23 — End: 2024-07-24

## 2023-02-23 NOTE — Progress Notes (Unsigned)
Office Visit    Patient Name: Eric [name]., MD Date of Encounter: 02/25/2023  PCP:  Dorothyann Peng, MD    [facility]  Cardiologist:  Chilton Si, MD  Advanced Practice Provider:  No care team member to display Electrophysiologist:  Lewayne Bunting, MD  Nephrologist: Dr. 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   Past Medical History    Past Medical History:  Diagnosis Date   Acute blood loss anemia    Acute encephalopathy    Acute hypoxemic respiratory failure (HCC)    Acute idiopathic gout of right ankle    Acute lumbar back pain    Acute on chronic systolic heart failure (HCC)    Acute respiratory failure (HCC)    AKI (acute kidney injury) (HCC)    Altered mental status    Aortic aneurysm without rupture (HCC) 02/09/2016   Aortic root enlargement (HCC) 02/13/2012   Aortic valve regurgitation, acquired 02/13/2012   Arrhythmia    Atrial flutter (HCC) 03/18/2012   Back pain    Cardiac arrest (HCC) 02/01/2016   CHF (congestive heart failure) (HCC)    Chronic anticoagulation 03/04/2013   Chronic kidney disease    kidney fx studies increased    Chronic lower back pain    Chronic renal insufficiency    Chronic systolic heart failure (HCC) 02/13/2012   Recent diagnosis 4 / 2013, LVEF 25% by Echo 12/2011  03/2013: Echo at [facility] Conclusions: 1. Left ventricular ejection fraction estimated by 2D at 40-45 percent. 2. Mild concentric left ventricular hypertrophy. 3. Mild left atrial enlargement. 4. Moderate aortic valve regurgitation. 5. The aortic root at the sinus(es) of valsalva is moderately dilated 6. Mild mitral valve regurgitation. 7.   CKD (chronic kidney disease)    CKD (chronic kidney disease), stage IV (HCC) 08/06/2013   Creatinine 2.4 on 07/04/13    Claustrophobia    Colon cancer screening 03/04/2013   Debility 02/22/2016   Diabetes mellitus type 2 in nonobese  [facility])    Diabetes mellitus type 2 in obese    Dysrhythmia    "palpitations"   Encounter for central line placement    Exertional dyspnea 01/2012   Femoral nerve injury 02/22/2016   Femoral neuropathy    Goals of care, counseling/discussion 05/05/2020   HCAP (healthcare-associated pneumonia)    Heart murmur    Hyperlipidemia 02/13/2012   Hypertension    Hypothyroidism    Internal hemorrhoids without mention of complication 04/11/2013   Labile blood pressure    Left bundle branch block 02/13/2012   Leg weakness, bilateral    Long term (current) use of anticoagulants 08/06/2013   Eliquis therapy    Long term current use of amiodarone 08/07/2016   Lower extremity weakness    Migraine 02/13/12   "opthalmic"   Multiple myeloma (HCC) 05/05/2020   Non-traumatic rhabdomyolysis    Obesity (BMI 30-39.9) 02/13/2012   Pain    Paroxysmal atrial fibrillation (HCC)    Pneumonia    Retroperitoneal bleed    Right ankle pain    Right knee pain    Severe aortic regurgitation 02/09/2016   Special screening for malignant neoplasms, colon 04/11/2013   Thyroid activity decreased    Tibial pain    Varicose vein of leg    right   Ventricular fibrillation (HCC) 02/09/2016   Weakness of both lower extremities    Past Surgical History:  Procedure Laterality Date   AORTIC VALVE REPLACEMENT  11/20/2018   CARDIAC CATHETERIZATION N/A 02/17/2016   Procedure: Left Heart Cath and Coronary Angiography;  Surgeon: Corky Crafts, MD;  Location: [facility] INVASIVE CV LAB;  Service: Cardiovascular;  Laterality: N/A;   CARDIOVERSION  03/22/2012   Procedure: CARDIOVERSION;  Surgeon: Donato Schultz, MD;  Location: [facility] ENDOSCOPY;  Service: Cardiovascular;  Laterality: N/A;   CARDIOVERSION  04/19/2012   Procedure: CARDIOVERSION;  Surgeon: Lesleigh Noe, MD;  Location: [facility] OR;  Service: Cardiovascular;  Laterality: N/A;   COLONOSCOPY N/A 04/11/2013   Procedure: COLONOSCOPY;  Surgeon: Louis Meckel, MD;  Location: [facility] ENDOSCOPY;   Service: Endoscopy;  Laterality: N/A;   COLONOSCOPY N/A 04/11/2013   Procedure: COLONOSCOPY;  Surgeon: Louis Meckel, MD;  Location: [facility] ENDOSCOPY;  Service: Endoscopy;  Laterality: N/A;   EP IMPLANTABLE DEVICE N/A 02/17/2016   Procedure: BiV ICD Insertion CRT-D;  Surgeon: Marinus Maw, MD;  Location: [facility] INVASIVE CV LAB;  Service: Cardiovascular;  Laterality: N/A;   FINGER SURGERY  2012   "4th digit right hand; thumb on left hand"   RADIOLOGY WITH ANESTHESIA N/A 02/11/2016   Procedure: MRI OF THE BRAIN WITHOUT CONTRAST, LUMBAR WITHOUT CONTRAST;  Surgeon: Medication Radiologist, MD;  Location: [facility] OR;  Service: Radiology;  Laterality: N/A;  DR. WOOD/MRI   RIGHT/LEFT HEART CATH AND CORONARY ANGIOGRAPHY N/A 07/26/2018   Procedure: RIGHT/LEFT HEART CATH AND CORONARY ANGIOGRAPHY;  Surgeon: Lyn Records, MD;  Location: [facility] INVASIVE CV LAB;  Service: Cardiovascular;  Laterality: N/A;   Skin melanocytoma excision  2012   "above left clavicle"   STERNAL INCISION RECLOSURE  11/2018   STERNAL WIRE REMOVAL  11/2018   STERNAL WOUND DEBRIDEMENT  11/2018   TEE WITHOUT CARDIOVERSION  03/22/2012   Procedure: TRANSESOPHAGEAL ECHOCARDIOGRAM (TEE);  Surgeon: Donato Schultz, MD;  Location: [facility] ENDOSCOPY;  Service: Cardiovascular;  Laterality: N/A;   TEE WITHOUT CARDIOVERSION N/A 02/08/2016   Procedure: TRANSESOPHAGEAL ECHOCARDIOGRAM (TEE);  Surgeon: Lewayne Bunting, MD;  Location: [facility] ENDOSCOPY;  Service: Cardiovascular;  Laterality: N/A;    Allergies  Allergies  Allergen Reactions   Lactose Intolerance (Gi) Diarrhea    History of Present Illness    Eric [name]., 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 Katrinka Blazing.  Prior ventricular fibrillation cardiac  arrest in 2017 at which time CPR was provided by his wife with residual RLE numbness and peripheral retinopathy. ICD placed at that time. He followed routinely with Dr. Katrinka Blazing regarding aortic regurgitation ultimately 11/20/2018 undergoing aortic valve replacement and aortic root replacement with bioprosthetic valve/aortic graft and single-vessel coronary grafting with saphenous vein.  Subsequent sternal wound infection required sternal flap and prolonged antibiotics.  At visit 12/2021 SGLT2i was added.    At most recent visit 06/19/2022 he was recommended for updated echo performed 08/25/2022 LVEF 30 to 35%, RWMA, LV moderately dilated, RV moderately enlarged, moderately elevated PASP, bilateral atria severely dilated, aortic valve s/p bioprosthetic valve with normal structure and function, moderate dilation aortic root 48 mm, ascending aorta 38 mm.  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 Presents today to establish with Dr. Duke Salvia after Dr. Michaelle Copas retirement. Enjoys spending time with his wife and family including 26-year-old grandson. Monitors BP at home with readings most  often 130s. When he was on chemotherapy would retain fluid and require Lasix 80mg  QD. Interested in reducing regimen if possible for protection of renal function. He is taking Lasi x80mg  at the end of the day between 4-5 pm due to work schedule. Usually back to himself about a week after chemotherapy treatment. Fluid retention usually about a weel or 2 after treatment.  EKGs/Labs/Other Studies Reviewed:   The following studies were reviewed today: Cardiac Studies & Procedures   CARDIAC CATHETERIZATION  CARDIAC CATHETERIZATION 07/26/2018  Narrative  2nd Diag lesion is 99% stenosed.  Ost 2nd Diag lesion is 50% stenosed.  Prox LAD to Mid LAD lesion is 40% stenosed.  Ost 1st Diag to 1st Diag lesion is 25% stenosed.  Ramus lesion is 25% stenosed.  LV end diastolic pressure is moderately elevated.  Hemodynamic  findings consistent with moderate pulmonary hypertension.  Ost Cx lesion is 40% stenosed.   Left dominant coronary anatomy.  Short left main or separate ostia of LAD and circumflex.  The LAD is widely patent with diffuse 30 to 40% mid vessel plaque.  Subtotally occluded branch of the second diagonal unchanged from prior coronary angiography.  Ostial 30-40% circumflex after the large first marginal or ramus branch origin.  The right coronary artery is nondominant and contains no significant obstruction.  Moderate pulmonary hypertension with mean PA pressure 43 mmHg, recorded PA pressure of 61/29 mmHg.  Pulmonary capillary wedge pressure mean 30 mmHg (the patient was prehydrated due to CKD and had not had furosemide on the day of procedure).  LVEDP 25 mmHg.  Left ventriculography not performed  Aortography not performed  RECOMMENDATIONS:   We will forward data to [facility] Silvestre Mesi at [facility]  No indication for antiplatelet therapy at this time.  Findings Coronary Findings Diagnostic  Dominance: Left  Left Anterior Descending Prox LAD to Mid LAD lesion is 40% stenosed.  First Diagonal Branch Ost 1st Diag to 1st Diag lesion is 25% stenosed.  Second Diagonal Branch Vessel is small in size. Ost 2nd Diag lesion is 50% stenosed. 2nd Diag lesion is 99% stenosed. The lesion is type C. TIMI 2 flow past the lesion.  Ramus Intermedius Ramus lesion is 25% stenosed.  Left Circumflex Ost Cx lesion is 40% stenosed.  First Obtuse Marginal Branch  Second Obtuse Marginal Branch Vessel is large in size.  Right Coronary Artery Vessel is small.  Intervention  No interventions have been documented.   CARDIAC CATHETERIZATION  CARDIAC CATHETERIZATION 02/17/2016  Narrative  Small branch of 2nd Diag lesion, 99% stenosed.  Otherwise nonobstructive coronary artery disease.  Mildly elevated LVEDP.  Small branch with significant disease. Major vessels are  widely patent. Will inform Dr. Katrinka Blazing and Dr. Ladona Ridgel. Decision to be made regarding proceeding with AICD placement. Continue aggressive medical therapy as well. Will gently hydrate postprocedure to help prevent contrast-induced nephropathy.  Findings Coronary Findings Diagnostic  Dominance: Left  Left Anterior Descending  First Diagonal Branch  Second Diagonal Branch The vessel is small in size.  The lesion is type C. TIMI 2 flow past the lesion.  Ramus Intermedius  Left Circumflex  First Obtuse Marginal Branch The vessel is small in size.  Intervention  No interventions have been documented.     ECHOCARDIOGRAM  ECHOCARDIOGRAM COMPLETE 08/25/2022  Narrative ECHOCARDIOGRAM REPORT    Patient Name:   MAYUKH IWEN JR. Date of Exam: 08/25/2022 Medical Rec #:  [id number]        Height:  69.0 in Accession #:    [id number]       Weight:       199.0 lb Date of Birth:  [birth year]        BSA:          2.062 m Patient Age:    71 years         BP:           120/85 mmHg Patient Gender: M                HR:           70 bpm. Exam Location:  [address]  Procedure: 2D Echo, Cardiac Doppler, Color Doppler and 3D Echo  Indications:    Systolic Congestive Heart Failure I50; s/p AVR Z95.26  History:        Patient has prior history of Echocardiogram examinations, most recent 04/06/2020. Defibrillator, Arrythmias:Atrial Fibrillation and LBBB; Risk Factors:Hypertension, Dyslipidemia and Diabetes. Aortic Valve: bioprosthetic valve is present in the aortic position. Procedure Date: 11/20/2018.  Sonographer:    Thurman Coyer RDCS Referring Phys: [id number] Barry Dienes [facility]  IMPRESSIONS   1. Left ventricular ejection fraction, by estimation, is 30 to 35%. The left ventricle has moderately decreased function. The left ventricle demonstrates regional wall motion abnormalities (see scoring diagram/findings for description). The left ventricular internal cavity size was moderately dilated.  Left ventricular diastolic parameters are indeterminate. 2. Right ventricular systolic function is normal. The right ventricular size is moderately enlarged. There is moderately elevated pulmonary artery systolic pressure. The estimated right ventricular systolic pressure is 45.9 mmHg. 3. Left atrial size was severely dilated. 4. Right atrial size was severely dilated. 5. The mitral valve is normal in structure. Moderate mitral valve regurgitation. No evidence of mitral stenosis. 6. Tricuspid valve regurgitation is severe. 7. The aortic valve has been repaired/replaced. Aortic valve regurgitation is not visualized. No aortic stenosis is present. There is a bioprosthetic valve present in the aortic position. Procedure Date: 11/20/2018. Echo findings are consistent with normal structure and function of the aortic valve prosthesis. Aortic valve area, by VTI measures 2.21 cm. Aortic valve mean gradient measures 4.0 mmHg. Aortic valve Vmax measures 1.41 m/s. 8. Aortic dilatation noted. There is moderate dilatation of the aortic root, measuring 48 mm. There is borderline dilatation of the ascending aorta, measuring 38 mm. 9. The inferior vena cava is dilated in size with <50% respiratory variability, suggesting right atrial pressure of 15 mmHg.  FINDINGS Left Ventricle: Left ventricular ejection fraction, by estimation, is 30 to 35%. The left ventricle has moderately decreased function. The left ventricle demonstrates regional wall motion abnormalities. 3D left ventricular ejection fraction analysis performed but not reported based on interpreter judgement due to suboptimal tracking. The left ventricular internal cavity size was moderately dilated. There is no left ventricular hypertrophy. Abnormal (paradoxical) septal motion, consistent with left bundle branch block. Left ventricular diastolic parameters are indeterminate.   LV Wall Scoring: The inferior wall is akinetic.  Right Ventricle: The right  ventricular size is moderately enlarged. No increase in right ventricular wall thickness. Right ventricular systolic function is normal. There is moderately elevated pulmonary artery systolic pressure. The tricuspid regurgitant velocity is 2.78 m/s, and with an assumed right atrial pressure of 15 mmHg, the estimated right ventricular systolic pressure is 45.9 mmHg.  Left Atrium: Left atrial size was severely dilated.  Right Atrium: Right atrial size was severely dilated.  Pericardium: There is no evidence of pericardial effusion.  Mitral Valve: The mitral  valve is normal in structure. Moderate mitral valve regurgitation, with posteriorly-directed jet. No evidence of mitral valve stenosis.  Tricuspid Valve: The tricuspid valve is normal in structure. Tricuspid valve regurgitation is severe. No evidence of tricuspid stenosis.  Aortic Valve: The aortic valve has been repaired/replaced. Aortic valve regurgitation is not visualized. No aortic stenosis is present. Aortic valve mean gradient measures 4.0 mmHg. Aortic valve peak gradient measures 8.0 mmHg. Aortic valve area, by VTI measures 2.21 cm. There is a bioprosthetic valve present in the aortic position. Procedure Date: 11/20/2018. Echo findings are consistent with normal structure and function of the aortic valve prosthesis.  Pulmonic Valve: The pulmonic valve was normal in structure. Pulmonic valve regurgitation is mild. No evidence of pulmonic stenosis.  Aorta: Aortic dilatation noted. There is moderate dilatation of the aortic root, measuring 48 mm. There is borderline dilatation of the ascending aorta, measuring 38 mm.  Venous: The inferior vena cava is dilated in size with less than 50% respiratory variability, suggesting right atrial pressure of 15 mmHg.  IAS/Shunts: No atrial level shunt detected by color flow Doppler.  Additional Comments: A device lead is visualized in the right ventricle.   LEFT VENTRICLE PLAX 2D LVIDd:          7.00 cm      Diastology LVIDs:         5.80 cm      LV e' medial:  8.19 cm/s LV PW:         1.00 cm      LV e' lateral: 6.65 cm/s LV IVS:        0.90 cm LVOT diam:     2.30 cm LV SV:         59 LV SV Index:   29 LVOT Area:     4.15 cm     3D Volume EF: 3D EF:        36 % LV EDV:       241 ml LV Volumes (MOD)            LV ESV:       155 ml LV vol d, MOD A2C: 179.0 ml LV SV:        86 ml LV vol d, MOD A4C: 156.0 ml LV vol s, MOD A2C: 124.0 ml LV vol s, MOD A4C: 103.0 ml LV SV MOD A2C:     55.0 ml LV SV MOD A4C:     156.0 ml LV SV MOD BP:      53.2 ml  RIGHT VENTRICLE RV Basal diam:  6.00 cm RV Mid diam:    4.40 cm RV S prime:     8.27 cm/s TAPSE (M-mode): 1.5 cm  LEFT ATRIUM              Index        RIGHT ATRIUM           Index LA diam:        4.60 cm  2.23 cm/m   RA Area:     33.80 cm LA Vol (A2C):   142.0 ml 68.88 ml/m  RA Volume:   128.00 ml 62.09 ml/m LA Vol (A4C):   113.0 ml 54.81 ml/m LA Biplane Vol: 130.0 ml 63.06 ml/m AORTIC VALVE AV Area (Vmax):    2.39 cm AV Area (Vmean):   2.24 cm AV Area (VTI):     2.21 cm AV Vmax:           141.00 cm/s AV  Vmean:          93.700 cm/s AV VTI:            0.269 m AV Peak Grad:      8.0 mmHg AV Mean Grad:      4.0 mmHg LVOT Vmax:         81.00 cm/s LVOT Vmean:        50.600 cm/s LVOT VTI:          0.143 m LVOT/AV VTI ratio: 0.53  AORTA Ao Root diam: 4.80 cm Ao Asc diam:  3.80 cm  MR Peak grad:    106.9 mmHg   TRICUSPID VALVE MR Mean grad:    65.0 mmHg    TR Peak grad:   30.9 mmHg MR Vmax:         517.00 cm/s  TR Vmax:        278.00 cm/s MR Vmean:        379.0 cm/s MR PISA:         4.02 cm     SHUNTS MR PISA Eff ROA: 31 mm       Systemic VTI:  0.14 m MR PISA Radius:  0.80 cm      Systemic Diam: 2.30 cm  Donato Schultz MD Electronically signed by Donato Schultz MD Signature Date/Time: 08/25/2022/4:12:48 PM    Final   TEE  ECHO TEE 02/08/2016  Narrative *[place]* *[facility]* [address] [place], [place] [address] [phone]  ------------------------------------------------------------------- Transesophageal Echocardiography  Patient:    Yahia, Gohn MR #:       [id number] Study Date: 02/08/2016 Gender:     M Age:        17 Height:     175.3 cm Weight:     107.7 kg BSA:        2.33 m^2 Pt. Status: Room:       [facility]  PERFORMING   Olga Millers ADMITTING    Leslye Peer ATTENDING    Leslye Peer ORDERING     Thurmon Fair, MD SONOGRAPHER  Delcie Roch, RDCS, CCT  cc:  ------------------------------------------------------------------- LV EF: 30% -   35%  ------------------------------------------------------------------- Indications:      Aortic insufficiency 424.1.  ------------------------------------------------------------------- Study Conclusions  - Left ventricle: Systolic function was moderately to severely reduced. The estimated ejection fraction was in the range of 30% to 35%. Diffuse hypokinesis. - Aortic valve: No evidence of vegetation. There was moderate regurgitation. - Mitral valve: No evidence of vegetation. - Left atrium: The atrium was moderately dilated. There was spontaneous echo contrast (&quot;smoke&quot;). - Right atrium: No evidence of thrombus in the atrial cavity or appendage. - Atrial septum: No defect or patent foramen ovale was identified. There was an atrial septal aneurysm. - Tricuspid valve: No evidence of vegetation. - Pulmonic valve: No evidence of vegetation.  Impressions:  - Moderate to severe global reduction in LV function; moderate LAE; severely dilated aortic root (5.2 cm); moderate (3+) AI likely due to aortic root dilatation; mild MR.  Diagnostic transesophageal echocardiography.  2D and color Doppler. Birthdate:  Patient birthdate: 05/29/1951.  Age:  Patient is 72 yr old.  Sex:  Gender: male.    BMI: 35.1 kg/m^2.  Blood pressure: 115/80  Patient status:  Inpatient.  Study  date:  Study date: 02/08/2016. Study time: 02:02 PM.  Location:  Endoscopy.  -------------------------------------------------------------------  ------------------------------------------------------------------- Left ventricle:  Systolic function was moderately to severely reduced. The estimated ejection fraction was in  the range of 30% to 35%. Diffuse hypokinesis.  ------------------------------------------------------------------- Aortic valve:   Structurally normal valve.   Cusp separation was normal.  No evidence of vegetation.  Doppler:  There was moderate regurgitation.  ------------------------------------------------------------------- Aorta:  Aortic root: The aortic root was severely dilated. Descending aorta: The descending aorta had mild diffuse disease.  ------------------------------------------------------------------- Mitral valve:   Structurally normal valve.   Leaflet separation was normal.  No evidence of vegetation.  Doppler:  There was trivial regurgitation.  ------------------------------------------------------------------- Left atrium:  The atrium was moderately dilated. There was spontaneous echo contrast (&quot;smoke&quot;).  ------------------------------------------------------------------- Atrial septum:  No defect or patent foramen ovale was identified. There was an atrial septal aneurysm.  ------------------------------------------------------------------- Right ventricle:  The cavity size was normal. Systolic function was normal.  ------------------------------------------------------------------- Pulmonic valve:    Structurally normal valve.   Cusp separation was normal.  No evidence of vegetation.  Doppler:  There was mild regurgitation.  ------------------------------------------------------------------- Tricuspid valve:   Structurally normal valve.   Leaflet separation was normal.  No evidence of vegetation.  Doppler:  There was  mild regurgitation.  ------------------------------------------------------------------- Right atrium:  The atrium was normal in size.  No evidence of thrombus in the atrial cavity or appendage.  ------------------------------------------------------------------- Pericardium:  There was no pericardial effusion.  ------------------------------------------------------------------- Prepared and Electronically Authenticated by  Olga Millers 2017-05-16T18:54:54             EKG:  EKG is ordered today.  EKG performed today demonstrates ventricular paced rhythm 70 bpm with no acute ST/T wave changes.  Recent Labs: 08/25/2022: NT-Pro BNP 3,947 01/26/2023: ALT 23; BUN 41; Creatinine 2.22; Hemoglobin 13.7; Platelet Count 123; Potassium 4.0; Sodium 139 01/29/2023: BNP 861.1; TSH 1.540  Recent Lipid Panel    Component Value Date/Time   CHOL 186 08/31/2022 0923   TRIG 42 08/31/2022 0923   HDL 87 08/31/2022 0923   CHOLHDL 2.1 08/31/2022 0923   CHOLHDL 3.8 02/13/2012 1829   VLDL 30 02/13/2012 1829   LDLCALC 90 08/31/2022 0923   Risk Assessment/Calculations:   CHA2DS2-VASc Score = 5   This indicates a 7.2% annual risk of stroke. The patient's score is based upon: CHF History: 1 HTN History: 1 Diabetes History: 1 Stroke History: 0 Vascular Disease History: 1 Age Score: 1 Gender Score: 0     Home Medications   Current Meds  Medication Sig   acetaminophen (TYLENOL) 500 MG tablet Take 1,000 mg by mouth every 4 (four) hours as needed for moderate pain or headache.   allopurinol (ZYLOPRIM) 100 MG tablet Take 1 tab by mouth every other day   apixaban (ELIQUIS) 5 MG TABS tablet Take 1 tablet (5 mg total) by mouth 2 (two) times daily.   apixaban (ELIQUIS) 5 MG TABS tablet Take 1 tablet (5 mg total) by mouth 2 (two) times daily.   carvedilol (COREG) 6.25 MG tablet Take 1 tablet by mouth twice a day   dapagliflozin propanediol (FARXIGA) 10 MG TABS tablet Take 1 tablet (10 mg total) by  mouth daily before breakfast.   fluticasone (FLONASE) 50 MCG/ACT nasal spray Place 2 sprays into both nostrils daily.   Hypromellose (ARTIFICIAL TEARS OP) Place 1 drop into both eyes daily as needed (dry eyes).   levothyroxine (SYNTHROID) 75 MCG tablet Take 1 tablet (75 mcg total) by mouth daily before breakfast.   loperamide (IMODIUM) 2 MG capsule Take 2 mg by mouth as needed for diarrhea or loose stools.   potassium chloride 20 MEQ/15ML (10%) SOLN TAKE 15 ML BY  MOUTH TWICE DAILY (Patient taking differently: daily.)   rosuvastatin (CRESTOR) 20 MG tablet TAKE 1 TABLET(20 MG) BY MOUTH DAILY   Semaglutide (OZEMPIC, 0.25 OR 0.5 MG/DOSE, [place]) Inject 0.25 mg into the skin once a week.   traMADol (ULTRAM) 50 MG tablet Take 1 tablet (50 mg total) by mouth every 6 (six) hours as needed.   vitamin B-12 (CYANOCOBALAMIN) 100 MCG tablet Take 100 mcg by mouth daily.   vitamin C (ASCORBIC ACID) 500 MG tablet Take 500 mg by mouth daily.   VITAMIN D PO Take 1 capsule by mouth daily.   [DISCONTINUED] amiodarone (PACERONE) 100 MG tablet TAKE 1 TABLET(100 MG) BY MOUTH DAILY   [DISCONTINUED] furosemide (LASIX) 80 MG tablet TAKE 1 TABLET(80 MG) BY MOUTH DAILY   [DISCONTINUED] isosorbide-hydrALAZINE (BIDIL) 20-37.5 MG tablet Take 0.5 tablets by mouth 2 (two) times daily.     Review of Systems      All other systems reviewed and are otherwise negative except as noted above.  Physical Exam    VS:  BP 136/84   Pulse 70   Ht 5\' 9"  (1.753 m)   Wt 193 lb 14.4 oz (88 kg)   BMI 28.63 kg/m  , BMI Body mass index is 28.63 kg/m.  Wt Readings from Last 3 Encounters:  02/23/23 193 lb 14.4 oz (88 kg)  02/13/23 192 lb (87.1 kg)  01/29/23 192 lb 12.8 oz (87.5 kg)    GEN: Well nourished, well developed, in no acute distress. HEENT: normal. Neck: Supple, no JVD, carotid bruits, or masses. Cardiac: RRR, no murmurs, rubs, or gallops. No clubbing, cyanosis, edema.  Radials/PT 2+ and equal bilaterally.  Respiratory:   Respirations regular and unlabored, clear to auscultation bilaterally. GI: Soft, nontender, nondistended. MS: No deformity or atrophy. Skin: Warm and dry, no rash. Neuro:  Strength and sensation are intact. Psych: Normal affect.  Assessment & Plan    Aortic regurgitation S/p AVR - AV prosthesis functioning appropriately by echo 08/2022. Continue SBE prophylaxis. BP control, as below.   CAD / HLD, LDL goal <70 - Stable with no anginal symptoms. No indication for ischemic evaluation.  GDMT Carvedilol, Farxiga, Rosuvastatin. No ASA due to [facility]. Heart healthy diet and regular cardiovascular exercise encouraged.    VF / s/p AICD / PAF / Hypercoagulable state - Follows with Dr. Ladona Ridgel. Denies palpitations and ICD shocks.  CHA2DS2-VASc Score = 5 [CHF History: 1, HTN History: 1, Diabetes History: 1, Stroke History: 0, Vascular Disease History: 1, Age Score: 1, Gender Score: 0].  Therefore, the patient's annual risk of stroke is 7.2 %.    Continue Eliquis 5mg  BID.  Does not meet dose reduction criteria.  Denies bleeding complications. Samples provided in clinic.01/26/23 Hb 13.7. Amiodarone monitoring: No signs of toxicity. Hypothyroidism per PCP. 01/26/23 AST 24, ALT 23.  DM2 - Continue to follow with PCP.   HTN / HFrEF - Euvolemic and well compensated on exam. BP reasonably well controlled, prior lightheadedness with relative hypotension. Echo 08/2022 LVEF 30 to 35% which was slightly improved from prior.  GDMT Carvedilol 6.2 mg twice daily, Farxiga 10 mg QD, Bidil half tab BID (previously dizzy with higher doses), Lasix 80mg  and 40mg  QOD. Reduce Lasix from 80mg  QD to 80mg  and 40mg  QOD for protectio nof renal function. May need 80mg  QD after chemotherapy treatments as he holds onto fluid which is permissible. Defer MRA due to renal function. Low sodium diet, fluid restriction <2L, and daily weights encouraged. Educated to contact our office for weight  gain of 2 lbs overnight or 5 lbs in one week.   CKD IV -  Has been referred to Dr. Valentino Nose of nephrology by Dr. Allyne Gee. Careful titration of diuretic and antihypertensive.  Continue Farxiga 10mg  QD.  MM -   Follows with Dr. Myna Hidalgo.    Disposition: Follow up  in 4-5 months  with Chilton Si, MD or APP.  Signed, Alver Sorrow, NP 02/25/2023, 12:11 PM [facility]

## 2023-02-23 NOTE — Patient Instructions (Addendum)
Medication Instructions:  Your physician has recommended you make the following change in your medication:   REDUCE Furosemide to 40mg  (half tablet) and 80mg  (whole tablet) every other day.   Okay to take a whole tablet daily if needed.  *If you need a refill on your cardiac medications before your next appointment, please call your pharmacy*  Testing/Procedures: Your EKG today shows your pacemaker is functioning appropriately.   Follow-Up: At [facility], you and your health needs are our priority.  As part of our continuing mission to provide you with exceptional heart care, we have created designated Provider Care Teams.  These Care Teams include your primary Cardiologist (physician) and Advanced Practice Providers (APPs -  Physician Assistants and Nurse Practitioners) who all work together to provide you with the care you need, when you need it.  We recommend signing up for the patient portal called "MyChart".  Sign up information is provided on this After Visit Summary.  MyChart is used to connect with patients for Virtual Visits (Telemedicine).  Patients are able to view lab/test results, encounter notes, upcoming appointments, etc.  Non-urgent messages can be sent to your provider as well.   To learn more about what you can do with MyChart, go to [email or web address].    Your next appointment:   October or November 2024 with Dr. Duke Salvia  Other Instructions  [facility]: [address] [place], [place] [address] [phone] Dr. Valentino Nose

## 2023-02-24 ENCOUNTER — Other Ambulatory Visit: Payer: Self-pay

## 2023-02-25 ENCOUNTER — Encounter (HOSPITAL_BASED_OUTPATIENT_CLINIC_OR_DEPARTMENT_OTHER): Payer: Self-pay | Admitting: Family

## 2023-02-26 ENCOUNTER — Telehealth: Payer: Self-pay | Admitting: Cardiovascular Disease

## 2023-02-26 DIAGNOSIS — I480 Paroxysmal atrial fibrillation: Secondary | ICD-10-CM

## 2023-02-26 DIAGNOSIS — Z952 Presence of prosthetic heart valve: Secondary | ICD-10-CM

## 2023-02-26 DIAGNOSIS — I5022 Chronic systolic (congestive) heart failure: Secondary | ICD-10-CM

## 2023-02-26 MED ORDER — ISOSORB DINITRATE-HYDRALAZINE 20-37.5 MG PO TABS
0.5000 | ORAL_TABLET | Freq: Two times a day (BID) | ORAL | 3 refills | Status: DC
Start: 2023-02-26 — End: 2023-07-26

## 2023-02-26 NOTE — Telephone Encounter (Signed)
Pt c/o medication issue:  1. Name of Medication: isosorbide-hydrALAZINE (BIDIL) 20-37.5 MG tablet   2. How are you currently taking this medication (dosage and times per day)? Take 0.5 tablets by mouth 2 (two) times daily.   3. Are you having a reaction (difficulty breathing--STAT)? no  4. What is your medication issue? Calling to make sure the dosage is correct on the prescription

## 2023-02-26 NOTE — Telephone Encounter (Signed)
Okay to refill as half tab BID as that is how he has been taking it.   Alver Sorrow, NP

## 2023-02-26 NOTE — Telephone Encounter (Signed)
Please verify dose, I don't see change on AVS, but I do see the order

## 2023-02-26 NOTE — Addendum Note (Signed)
Addended by: Marlene Lard on: 02/26/2023 10:15 AM   Modules accepted: Orders

## 2023-03-02 ENCOUNTER — Encounter: Payer: Self-pay | Admitting: Hematology & Oncology

## 2023-03-02 ENCOUNTER — Inpatient Hospital Stay: Payer: Medicare Other | Attending: Hematology & Oncology

## 2023-03-02 ENCOUNTER — Inpatient Hospital Stay (HOSPITAL_BASED_OUTPATIENT_CLINIC_OR_DEPARTMENT_OTHER): Payer: Medicare Other | Admitting: Hematology & Oncology

## 2023-03-02 VITALS — BP 122/75 | HR 86 | Temp 98.6°F | Resp 20 | Ht 69.0 in | Wt 197.4 lb

## 2023-03-02 DIAGNOSIS — Z79899 Other long term (current) drug therapy: Secondary | ICD-10-CM | POA: Insufficient documentation

## 2023-03-02 DIAGNOSIS — M7989 Other specified soft tissue disorders: Secondary | ICD-10-CM | POA: Diagnosis not present

## 2023-03-02 DIAGNOSIS — C9000 Multiple myeloma not having achieved remission: Secondary | ICD-10-CM | POA: Diagnosis not present

## 2023-03-02 LAB — CBC WITH DIFFERENTIAL (CANCER CENTER ONLY)
Abs Immature Granulocytes: 0.07 10*3/uL (ref 0.00–0.07)
Basophils Absolute: 0 10*3/uL (ref 0.0–0.1)
Basophils Relative: 0 %
Eosinophils Absolute: 0 10*3/uL (ref 0.0–0.5)
Eosinophils Relative: 0 %
HCT: 40.2 % (ref 39.0–52.0)
Hemoglobin: 13.5 g/dL (ref 13.0–17.0)
Immature Granulocytes: 1 %
Lymphocytes Relative: 8 %
Lymphs Abs: 0.7 10*3/uL (ref 0.7–4.0)
MCH: 31.1 pg (ref 26.0–34.0)
MCHC: 33.6 g/dL (ref 30.0–36.0)
MCV: 92.6 fL (ref 80.0–100.0)
Monocytes Absolute: 1 10*3/uL (ref 0.1–1.0)
Monocytes Relative: 12 %
Neutro Abs: 6.8 10*3/uL (ref 1.7–7.7)
Neutrophils Relative %: 79 %
Platelet Count: 177 10*3/uL (ref 150–400)
RBC: 4.34 MIL/uL (ref 4.22–5.81)
RDW: 16 % — ABNORMAL HIGH (ref 11.5–15.5)
WBC Count: 8.6 10*3/uL (ref 4.0–10.5)
nRBC: 0 % (ref 0.0–0.2)

## 2023-03-02 LAB — CMP (CANCER CENTER ONLY)
ALT: 19 U/L (ref 0–44)
AST: 20 U/L (ref 15–41)
Albumin: 3.6 g/dL (ref 3.5–5.0)
Alkaline Phosphatase: 88 U/L (ref 38–126)
Anion gap: 8 (ref 5–15)
BUN: 43 mg/dL — ABNORMAL HIGH (ref 8–23)
CO2: 25 mmol/L (ref 22–32)
Calcium: 8.9 mg/dL (ref 8.9–10.3)
Chloride: 105 mmol/L (ref 98–111)
Creatinine: 1.97 mg/dL — ABNORMAL HIGH (ref 0.61–1.24)
GFR, Estimated: 36 mL/min — ABNORMAL LOW (ref >60)
Glucose, Bld: 178 mg/dL — ABNORMAL HIGH (ref 70–99)
Potassium: 3.9 mmol/L (ref 3.5–5.1)
Sodium: 138 mmol/L (ref 135–145)
Total Bilirubin: 0.7 mg/dL (ref 0.3–1.2)
Total Protein: 6 g/dL — ABNORMAL LOW (ref 6.5–8.1)

## 2023-03-02 LAB — LACTATE DEHYDROGENASE: LDH: 202 U/L — ABNORMAL HIGH (ref 98–192)

## 2023-03-02 NOTE — Progress Notes (Signed)
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 03/02/2023   Principle Diagnosis:  IgG kappa myeloma  - 1q+   Current Therapy:        CyBorD -- s/p cycle  #19 - started on 06/04/2020 --last treatment in 08/2022 --on hold for now.   Interim History:  Eric [name] is here today for follow-up.  He looks quite good.  I am very impressed with how well he is doing.  He has been off therapy for 6 months.  We are just watching him for right now.    He is doing a good job helping to take care of his wife.  She has had her challenges.  He is doing his best to try to make her life as good as possible.  He is still working as a Clinical research associate.  He really does not do much in the way of surgery.  Over the last saw him back in early May, there was no monoclonal spike in his blood.  His IgG level was 500 mg/dL.  The Kappa light chain was 2.5 mg/dL.  He has had no problems with infection.  Is been no problems with nausea or vomiting.  He has had no cough or shortness of breath.  He has had no change in bowel or bladder habits.  There is been no bleeding.  Overall, I would have to say that his performance status is probably ECOG 1.     Medications:  Allergies as of 03/02/2023       Reactions   Lactose Intolerance (gi) Diarrhea        Medication List        Accurate as of March 02, 2023  8:34 AM. If you have any questions, ask your nurse or doctor.          acetaminophen 500 MG tablet Commonly known as: TYLENOL Take 1,000 mg by mouth every 4 (four) hours as needed for moderate pain or headache.   allopurinol 100 MG tablet Commonly known as: ZYLOPRIM Take 1 tab by mouth every other day   amiodarone 100 MG tablet Commonly known as: PACERONE Take 1 tablet (100 mg total) by mouth daily.   apixaban 5 MG Tabs tablet Commonly known as: Eliquis Take 1 tablet (5 mg total) by mouth 2 (two) times daily. What changed: Another medication with the  same name was removed. Continue taking this medication, and follow the directions you see here. Changed by: Josph Macho, MD   ARTIFICIAL TEARS OP Place 1 drop into both eyes daily as needed (dry eyes).   ascorbic acid 500 MG tablet Commonly known as: VITAMIN C Take 500 mg by mouth daily.   carvedilol 6.25 MG tablet Commonly known as: COREG Take 1 tablet by mouth twice a day   dapagliflozin propanediol 10 MG Tabs tablet Commonly known as: Farxiga Take 1 tablet (10 mg total) by mouth daily before breakfast.   famciclovir 500 MG tablet Commonly known as: FAMVIR Take 1 tablet (500 mg total) by mouth daily.   ferrous sulfate 324 (65 Fe) MG Tbec Take 1 tablet by mouth daily.   fluticasone 50 MCG/ACT nasal spray Commonly known as: FLONASE Place 2 sprays into both nostrils daily.   furosemide 80 MG tablet Commonly known as: LASIX Alternate one tablet (80mg ) and half tablet (40mg ) every other day. Okay to take whole tablet as needed for fluid retention, edema.   isosorbide-hydrALAZINE 20-37.5 MG tablet Commonly known as: BIDIL  Take 0.5 tablets by mouth 2 (two) times daily.   levothyroxine 75 MCG tablet Commonly known as: SYNTHROID Take 1 tablet (75 mcg total) by mouth daily before breakfast.   loperamide 2 MG capsule Commonly known as: IMODIUM Take 2 mg by mouth as needed for diarrhea or loose stools.   OZEMPIC (0.25 OR 0.5 MG/DOSE) [place] Inject 0.25 mg into the skin once a week.   potassium chloride 20 MEQ/15ML (10%) Soln TAKE 15 ML BY MOUTH TWICE DAILY What changed: See the new instructions.   predniSONE 20 MG tablet Commonly known as: DELTASONE Take 20 mg by mouth daily.   rosuvastatin 20 MG tablet Commonly known as: CRESTOR TAKE 1 TABLET(20 MG) BY MOUTH DAILY   traMADol 50 MG tablet Commonly known as: Ultram Take 1 tablet (50 mg total) by mouth every 6 (six) hours as needed.   vitamin B-12 100 MCG tablet Commonly known as: CYANOCOBALAMIN Take 100 mcg by  mouth daily.   VITAMIN D PO Take 1,000 Units by mouth daily.        Allergies:  Allergies  Allergen Reactions   Lactose Intolerance (Gi) Diarrhea    Past Medical History, Surgical history, Social history, and Family History were reviewed and updated.  Review of Systems: Review of Systems  Constitutional: Negative.   Eyes: Negative.   Respiratory: Negative.    Cardiovascular:  Positive for leg swelling.  Gastrointestinal: Negative.   Genitourinary: Negative.   Musculoskeletal: Negative.   Skin: Negative.   Neurological: Negative.   Endo/Heme/Allergies: Negative.   Psychiatric/Behavioral: Negative.       Physical Exam:  height is 5\' 9"  (1.753 m) and weight is 197 lb 6.4 oz (89.5 kg). His oral temperature is 98.6 F (37 C). His blood pressure is 122/75 and his pulse is 86. His respiration is 20 and oxygen saturation is 96%.   Wt Readings from Last 3 Encounters:  03/02/23 197 lb 6.4 oz (89.5 kg)  02/23/23 193 lb 14.4 oz (88 kg)  02/13/23 192 lb (87.1 kg)    Physical Exam Vitals reviewed.  HENT:     Head: Normocephalic and atraumatic.  Eyes:     Pupils: Pupils are equal, round, and reactive to light.  Cardiovascular:     Rate and Rhythm: Normal rate and regular rhythm.     Heart sounds: Normal heart sounds.  Pulmonary:     Effort: Pulmonary effort is normal.     Breath sounds: Normal breath sounds.  Abdominal:     General: Bowel sounds are normal.     Palpations: Abdomen is soft.  Musculoskeletal:        General: No tenderness or deformity. Normal range of motion.     Cervical back: Normal range of motion.  Lymphadenopathy:     Cervical: No cervical adenopathy.  Skin:    General: Skin is warm and dry.     Findings: No erythema or rash.  Neurological:     Mental Status: He is alert and oriented to person, place, and time.  Psychiatric:        Behavior: Behavior normal.        Thought Content: Thought content normal.        Judgment: Judgment normal.      Lab Results  Component Value Date   WBC 8.6 03/02/2023   HGB 13.5 03/02/2023   HCT 40.2 03/02/2023   MCV 92.6 03/02/2023   PLT 177 03/02/2023   Lab Results  Component Value Date   FERRITIN 56 09/15/2022  IRON 92 09/15/2022   TIBC 349 09/15/2022   UIBC 257 09/15/2022   IRONPCTSAT 26 09/15/2022   Lab Results  Component Value Date   RETICCTPCT 1.4 09/15/2022   RBC 4.34 03/02/2023   Lab Results  Component Value Date   KPAFRELGTCHN 24.6 (H) 01/26/2023   LAMBDASER 19.6 01/26/2023   KAPLAMBRATIO 1.26 01/26/2023   Lab Results  Component Value Date   IGGSERUM 500 (L) 01/26/2023   IGA 87 01/26/2023   IGMSERUM 19 01/26/2023   Lab Results  Component Value Date   TOTALPROTELP 5.2 (L) 01/26/2023   ALBUMINELP 3.1 01/26/2023   A1GS 0.2 01/26/2023   A2GS 0.6 01/26/2023   BETS 0.9 01/26/2023   GAMS 0.4 01/26/2023   MSPIKE Not Observed 01/26/2023   SPEI Comment 04/22/2021     Chemistry      Component Value Date/Time   NA 139 01/26/2023 1330   NA 144 08/25/2022 1050   K 4.0 01/26/2023 1330   CL 105 01/26/2023 1330   CO2 27 01/26/2023 1330   BUN 41 (H) 01/26/2023 1330   BUN 37 (H) 08/25/2022 1050   CREATININE 2.22 (H) 01/26/2023 1330   CREATININE 1.95 (H) 09/08/2016 0859      Component Value Date/Time   CALCIUM 9.0 01/29/2023 1713   ALKPHOS 75 01/26/2023 1330   AST 24 01/26/2023 1330   ALT 23 01/26/2023 1330   BILITOT 0.7 01/26/2023 1330       Impression and Plan: 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  He has done incredibly well with treatment.  Again, he has been off treatment now for about 6 months.  His monoclonal studies have all looked pretty stable.  We will continue to hold his treatments.  If he started to see changes with his myeloma studies, then we can certainly initiate therapy.  I know his priority is taking care of his wife.  I want to make sure that he is able to do this without any  hindrance from [place].  We will now get him back in about 5 weeks.    Josph Macho, MD 6/7/20248:34 AM

## 2023-03-04 LAB — IGG, IGA, IGM
IgA: 217 mg/dL (ref 61–437)
IgG (Immunoglobin G), Serum: 638 mg/dL (ref 603–1613)
IgM (Immunoglobulin M), Srm: 43 mg/dL (ref 15–143)

## 2023-03-05 ENCOUNTER — Encounter (HOSPITAL_BASED_OUTPATIENT_CLINIC_OR_DEPARTMENT_OTHER): Payer: Self-pay

## 2023-03-07 LAB — KAPPA/LAMBDA LIGHT CHAINS
Kappa free light chain: 17.1 mg/L (ref 3.3–19.4)
Kappa, lambda light chain ratio: 0.79 (ref 0.26–1.65)
Lambda free light chains: 21.6 mg/L (ref 5.7–26.3)

## 2023-03-13 ENCOUNTER — Ambulatory Visit (INDEPENDENT_AMBULATORY_CARE_PROVIDER_SITE_OTHER): Payer: Medicare Other

## 2023-03-13 DIAGNOSIS — I428 Other cardiomyopathies: Secondary | ICD-10-CM | POA: Diagnosis not present

## 2023-03-13 DIAGNOSIS — I5022 Chronic systolic (congestive) heart failure: Secondary | ICD-10-CM

## 2023-03-13 LAB — IMMUNOFIXATION REFLEX, SERUM
IgA: 262 mg/dL (ref 61–437)
IgG (Immunoglobin G), Serum: 790 mg/dL (ref 603–1613)
IgM (Immunoglobulin M), Srm: 57 mg/dL (ref 15–143)

## 2023-03-13 LAB — PROTEIN ELECTROPHORESIS, SERUM, WITH REFLEX
A/G Ratio: 1.6 (ref 0.7–1.7)
Albumin ELP: 3.3 g/dL (ref 2.9–4.4)
Alpha-1-Globulin: 0.2 g/dL (ref 0.0–0.4)
Alpha-2-Globulin: 0.5 g/dL (ref 0.4–1.0)
Beta Globulin: 0.9 g/dL (ref 0.7–1.3)
Gamma Globulin: 0.5 g/dL (ref 0.4–1.8)
Globulin, Total: 2.1 g/dL — ABNORMAL LOW (ref 2.2–3.9)
SPEP Interpretation: 0
Total Protein ELP: 5.4 g/dL — ABNORMAL LOW (ref 6.0–8.5)

## 2023-03-14 LAB — CUP PACEART REMOTE DEVICE CHECK
Battery Remaining Longevity: 19 mo
Battery Remaining Percentage: 23 %
Battery Voltage: 2.8 V
Date Time Interrogation Session: 20240618034807
HighPow Impedance: 61 Ohm
HighPow Impedance: 61 Ohm
Implantable Lead Connection Status: 753985
Implantable Lead Connection Status: 753985
Implantable Lead Connection Status: 753985
Implantable Lead Implant Date: 20170525
Implantable Lead Implant Date: 20170525
Implantable Lead Implant Date: 20170525
Implantable Lead Location: 753858
Implantable Lead Location: 753859
Implantable Lead Location: 753860
Implantable Lead Model: 7122
Implantable Pulse Generator Implant Date: 20170525
Lead Channel Impedance Value: 380 Ohm
Lead Channel Impedance Value: 430 Ohm
Lead Channel Impedance Value: 600 Ohm
Lead Channel Pacing Threshold Amplitude: 0.5 V
Lead Channel Pacing Threshold Amplitude: 1.25 V
Lead Channel Pacing Threshold Pulse Width: 0.5 ms
Lead Channel Pacing Threshold Pulse Width: 0.5 ms
Lead Channel Sensing Intrinsic Amplitude: 0.2 mV
Lead Channel Sensing Intrinsic Amplitude: 11.7 mV
Lead Channel Setting Pacing Amplitude: 2 V
Lead Channel Setting Pacing Amplitude: 2.5 V
Lead Channel Setting Pacing Pulse Width: 0.5 ms
Lead Channel Setting Pacing Pulse Width: 0.5 ms
Lead Channel Setting Sensing Sensitivity: 0.5 mV
Pulse Gen Serial Number: 7357926
Zone Setting Status: 755011

## 2023-03-21 DIAGNOSIS — Z8674 Personal history of sudden cardiac arrest: Secondary | ICD-10-CM | POA: Diagnosis not present

## 2023-03-21 DIAGNOSIS — R4189 Other symptoms and signs involving cognitive functions and awareness: Secondary | ICD-10-CM | POA: Diagnosis not present

## 2023-03-21 DIAGNOSIS — R42 Dizziness and giddiness: Secondary | ICD-10-CM | POA: Diagnosis not present

## 2023-03-28 ENCOUNTER — Other Ambulatory Visit: Payer: Self-pay | Admitting: Nurse Practitioner

## 2023-03-28 DIAGNOSIS — M109 Gout, unspecified: Secondary | ICD-10-CM

## 2023-04-04 NOTE — Progress Notes (Signed)
Remote ICD transmission.   

## 2023-04-06 ENCOUNTER — Inpatient Hospital Stay: Payer: Medicare Other | Attending: Hematology & Oncology

## 2023-04-06 ENCOUNTER — Ambulatory Visit (INDEPENDENT_AMBULATORY_CARE_PROVIDER_SITE_OTHER): Payer: Medicare Other | Admitting: Podiatry

## 2023-04-06 ENCOUNTER — Inpatient Hospital Stay: Payer: Medicare Other | Admitting: Hematology & Oncology

## 2023-04-06 ENCOUNTER — Encounter: Payer: Self-pay | Admitting: Podiatry

## 2023-04-06 ENCOUNTER — Inpatient Hospital Stay: Payer: Medicare Other

## 2023-04-06 ENCOUNTER — Encounter: Payer: Self-pay | Admitting: Hematology & Oncology

## 2023-04-06 VITALS — BP 144/85 | HR 81 | Temp 98.6°F | Resp 20 | Ht 69.0 in | Wt 190.1 lb

## 2023-04-06 DIAGNOSIS — Z992 Dependence on renal dialysis: Secondary | ICD-10-CM | POA: Insufficient documentation

## 2023-04-06 DIAGNOSIS — B351 Tinea unguium: Secondary | ICD-10-CM

## 2023-04-06 DIAGNOSIS — M79674 Pain in right toe(s): Secondary | ICD-10-CM | POA: Diagnosis not present

## 2023-04-06 DIAGNOSIS — Z79899 Other long term (current) drug therapy: Secondary | ICD-10-CM | POA: Diagnosis not present

## 2023-04-06 DIAGNOSIS — C9000 Multiple myeloma not having achieved remission: Secondary | ICD-10-CM | POA: Insufficient documentation

## 2023-04-06 DIAGNOSIS — M7989 Other specified soft tissue disorders: Secondary | ICD-10-CM | POA: Diagnosis not present

## 2023-04-06 DIAGNOSIS — M79675 Pain in left toe(s): Secondary | ICD-10-CM | POA: Diagnosis not present

## 2023-04-06 DIAGNOSIS — E119 Type 2 diabetes mellitus without complications: Secondary | ICD-10-CM

## 2023-04-06 DIAGNOSIS — L608 Other nail disorders: Secondary | ICD-10-CM

## 2023-04-06 LAB — CMP (CANCER CENTER ONLY)
ALT: 16 U/L (ref 0–44)
AST: 23 U/L (ref 15–41)
Albumin: 3.8 g/dL (ref 3.5–5.0)
Alkaline Phosphatase: 78 U/L (ref 38–126)
Anion gap: 8 (ref 5–15)
BUN: 26 mg/dL — ABNORMAL HIGH (ref 8–23)
CO2: 24 mmol/L (ref 22–32)
Calcium: 8.7 mg/dL — ABNORMAL LOW (ref 8.9–10.3)
Chloride: 109 mmol/L (ref 98–111)
Creatinine: 1.93 mg/dL — ABNORMAL HIGH (ref 0.61–1.24)
GFR, Estimated: 37 mL/min — ABNORMAL LOW (ref >60)
Glucose, Bld: 107 mg/dL — ABNORMAL HIGH (ref 70–99)
Potassium: 3.7 mmol/L (ref 3.5–5.1)
Sodium: 141 mmol/L (ref 135–145)
Total Bilirubin: 1.2 mg/dL (ref 0.3–1.2)
Total Protein: 6 g/dL — ABNORMAL LOW (ref 6.5–8.1)

## 2023-04-06 LAB — CBC WITH DIFFERENTIAL (CANCER CENTER ONLY)
Abs Immature Granulocytes: 0.01 10*3/uL (ref 0.00–0.07)
Basophils Absolute: 0 10*3/uL (ref 0.0–0.1)
Basophils Relative: 1 %
Eosinophils Absolute: 0.2 10*3/uL (ref 0.0–0.5)
Eosinophils Relative: 3 %
HCT: 40.7 % (ref 39.0–52.0)
Hemoglobin: 13.3 g/dL (ref 13.0–17.0)
Immature Granulocytes: 0 %
Lymphocytes Relative: 11 %
Lymphs Abs: 0.7 10*3/uL (ref 0.7–4.0)
MCH: 31.1 pg (ref 26.0–34.0)
MCHC: 32.7 g/dL (ref 30.0–36.0)
MCV: 95.3 fL (ref 80.0–100.0)
Monocytes Absolute: 0.8 10*3/uL (ref 0.1–1.0)
Monocytes Relative: 13 %
Neutro Abs: 4.6 10*3/uL (ref 1.7–7.7)
Neutrophils Relative %: 72 %
Platelet Count: 141 10*3/uL — ABNORMAL LOW (ref 150–400)
RBC: 4.27 MIL/uL (ref 4.22–5.81)
RDW: 15.9 % — ABNORMAL HIGH (ref 11.5–15.5)
WBC Count: 6.4 10*3/uL (ref 4.0–10.5)
nRBC: 0 % (ref 0.0–0.2)

## 2023-04-06 NOTE — Progress Notes (Signed)
Hematology and Oncology Follow Up Visit  Eric [name]., MD [id number] 03/20/1951 72 y.o. 04/06/2023   Principle Diagnosis:  IgG kappa myeloma  - 1q+   Current Therapy:        CyBorD -- s/p cycle  #19 - started on 06/04/2020 --last treatment in 08/2022 --on hold for now.   Interim History:  Eric [name] is here today for follow-up.  He looks quite good.  I am very impressed with how well he is doing.  He has been off therapy for 8 months.  We are just watching him for right now.    When we last saw him, there was no monoclonal spike in his blood.  His IgG level was 700 mg/dL.  The kappa light chain was 1.7 mg/dL.  He is still an ophthalmologist.  He is not doing surgery right now.  He is having to take care of his poor wife.  She has her challenges.  I think she is on dialysis.  He has had no issues with fever.  He has had no cough or shortness of breath.  He has had no change in bowel or bladder habits.  There is been no nausea or vomiting.  He has had no problems with leg swelling.  There is no neuropathy.  Overall, I would say his performance status is probably ECOG 1.     Medications:  Allergies as of 04/06/2023       Reactions   Lactose Intolerance (gi) Diarrhea        Medication List        Accurate as of April 06, 2023  1:19 PM. If you have any questions, ask your nurse or doctor.          acetaminophen 500 MG tablet Commonly known as: TYLENOL Take 1,000 mg by mouth every 4 (four) hours as needed for moderate pain or headache.   allopurinol 100 MG tablet Commonly known as: ZYLOPRIM TAKE 1 TABLET BY MOUTH EVERY OTHER DAY What changed: additional instructions   amiodarone 100 MG tablet Commonly known as: PACERONE Take 1 tablet (100 mg total) by mouth daily.   apixaban 5 MG Tabs tablet Commonly known as: Eliquis Take 1 tablet (5 mg total) by mouth 2 (two) times daily.   ARTIFICIAL TEARS OP Place 1 drop into both eyes daily as needed (dry eyes).    ascorbic acid 500 MG tablet Commonly known as: VITAMIN C Take 500 mg by mouth daily.   carvedilol 6.25 MG tablet Commonly known as: COREG Take 1 tablet by mouth twice a day   dapagliflozin propanediol 10 MG Tabs tablet Commonly known as: Farxiga Take 1 tablet (10 mg total) by mouth daily before breakfast.   famciclovir 500 MG tablet Commonly known as: FAMVIR Take 1 tablet (500 mg total) by mouth daily.   ferrous sulfate 324 (65 Fe) MG Tbec Take 1 tablet by mouth daily.   fluticasone 50 MCG/ACT nasal spray Commonly known as: FLONASE Place 2 sprays into both nostrils daily.   furosemide 80 MG tablet Commonly known as: LASIX Alternate one tablet (80mg ) and half tablet (40mg ) every other day. Okay to take whole tablet as needed for fluid retention, edema.   isosorbide-hydrALAZINE 20-37.5 MG tablet Commonly known as: BIDIL Take 0.5 tablets by mouth 2 (two) times daily.   levothyroxine 75 MCG tablet Commonly known as: SYNTHROID Take 1 tablet (75 mcg total) by mouth daily before breakfast.   loperamide 2 MG capsule Commonly known as: IMODIUM Take 2  mg by mouth as needed for diarrhea or loose stools.   OZEMPIC (0.25 OR 0.5 MG/DOSE) [place] Inject 0.25 mg into the skin once a week.   potassium chloride 20 MEQ/15ML (10%) Soln TAKE 15 ML BY MOUTH TWICE DAILY What changed: See the new instructions.   predniSONE 20 MG tablet Commonly known as: DELTASONE Take 20 mg by mouth daily.   rosuvastatin 20 MG tablet Commonly known as: CRESTOR TAKE 1 TABLET(20 MG) BY MOUTH DAILY   traMADol 50 MG tablet Commonly known as: Ultram Take 1 tablet (50 mg total) by mouth every 6 (six) hours as needed.   vitamin B-12 100 MCG tablet Commonly known as: CYANOCOBALAMIN Take 100 mcg by mouth daily.   VITAMIN D PO Take 1,000 Units by mouth daily.        Allergies:  Allergies  Allergen Reactions   Lactose Intolerance (Gi) Diarrhea    Past Medical History, Surgical history, Social  history, and Family History were reviewed and updated.  Review of Systems: Review of Systems  Constitutional: Negative.   Eyes: Negative.   Respiratory: Negative.    Cardiovascular:  Positive for leg swelling.  Gastrointestinal: Negative.   Genitourinary: Negative.   Musculoskeletal: Negative.   Skin: Negative.   Neurological: Negative.   Endo/Heme/Allergies: Negative.   Psychiatric/Behavioral: Negative.       Physical Exam:  height is 5\' 9"  (1.753 m) and weight is 190 lb 1.9 oz (86.2 kg). His oral temperature is 98.6 F (37 C). His blood pressure is 137/80 (pended) and his pulse is 81. His respiration is 20 and oxygen saturation is 98%.   Wt Readings from Last 3 Encounters:  04/06/23 190 lb 1.9 oz (86.2 kg)  03/02/23 197 lb 6.4 oz (89.5 kg)  02/23/23 193 lb 14.4 oz (88 kg)    Physical Exam Vitals reviewed.  HENT:     Head: Normocephalic and atraumatic.  Eyes:     Pupils: Pupils are equal, round, and reactive to light.  Cardiovascular:     Rate and Rhythm: Normal rate and regular rhythm.     Heart sounds: Normal heart sounds.  Pulmonary:     Effort: Pulmonary effort is normal.     Breath sounds: Normal breath sounds.  Abdominal:     General: Bowel sounds are normal.     Palpations: Abdomen is soft.  Musculoskeletal:        General: No tenderness or deformity. Normal range of motion.     Cervical back: Normal range of motion.  Lymphadenopathy:     Cervical: No cervical adenopathy.  Skin:    General: Skin is warm and dry.     Findings: No erythema or rash.  Neurological:     Mental Status: He is alert and oriented to person, place, and time.  Psychiatric:        Behavior: Behavior normal.        Thought Content: Thought content normal.        Judgment: Judgment normal.     Lab Results  Component Value Date   WBC 6.4 04/06/2023   HGB 13.3 04/06/2023   HCT 40.7 04/06/2023   MCV 95.3 04/06/2023   PLT 141 (L) 04/06/2023   Lab Results  Component Value  Date   FERRITIN 56 09/15/2022   IRON 92 09/15/2022   TIBC 349 09/15/2022   UIBC 257 09/15/2022   IRONPCTSAT 26 09/15/2022   Lab Results  Component Value Date   RETICCTPCT 1.4 09/15/2022   RBC 4.27 04/06/2023  Lab Results  Component Value Date   KPAFRELGTCHN 17.1 03/02/2023   LAMBDASER 21.6 03/02/2023   KAPLAMBRATIO 0.79 03/02/2023   Lab Results  Component Value Date   IGGSERUM 790 03/02/2023   IGA 262 03/02/2023   IGMSERUM 57 03/02/2023   Lab Results  Component Value Date   TOTALPROTELP 5.4 (L) 03/02/2023   ALBUMINELP 3.3 03/02/2023   A1GS 0.2 03/02/2023   A2GS 0.5 03/02/2023   BETS 0.9 03/02/2023   GAMS 0.5 03/02/2023   MSPIKE Not Observed 03/02/2023   SPEI Comment 04/22/2021     Chemistry      Component Value Date/Time   NA 141 04/06/2023 1233   NA 144 08/25/2022 1050   K 3.7 04/06/2023 1233   CL 109 04/06/2023 1233   CO2 24 04/06/2023 1233   BUN 26 (H) 04/06/2023 1233   BUN 37 (H) 08/25/2022 1050   CREATININE 1.93 (H) 04/06/2023 1233   CREATININE 1.95 (H) 09/08/2016 0859      Component Value Date/Time   CALCIUM 8.7 (L) 04/06/2023 1233   ALKPHOS 78 04/06/2023 1233   AST 23 04/06/2023 1233   ALT 16 04/06/2023 1233   BILITOT 1.2 04/06/2023 1233       Impression and Plan: 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  He has done incredibly well with treatment.  Again, he has been off treatment now for about 8 months.  His monoclonal studies have all looked pretty stable.  We will continue to hold his treatments.  If he started to see changes with his myeloma studies, then we can certainly initiate therapy.  I know his priority is taking care of his wife.  I want to make sure that he is able to do this without any hindrance from [place].  We will now get him back in about 2 months.  I am happy that he is doing so well.  His quality of life is doing nicely.  Again he is able to take care of his wife.   Josph Macho, MD 7/12/20241:19 PM

## 2023-04-06 NOTE — Progress Notes (Signed)
This patient returns to my office for at risk foot care.  This patient requires this care by a professional since this patient will be at risk due to having diabetes type 2, chronic kidney disease and coagulation defect.  Patient is taking eliquisThis patient is unable to cut nails himself since the patient cannot reach his nails.These nails are painful walking and wearing shoes.  This patient presents for at risk foot care today.  General Appearance  Alert, conversant and in no acute stress.  Vascular  Dorsalis pedis and posterior tibial  pulses are palpable  bilaterally.  Capillary return is within normal limits  bilaterally. Temperature is within normal limits  bilaterally.  Neurologic  Senn-Weinstein monofilament wire test within normal limits  bilaterally. Muscle power within normal limits bilaterally.  Nails Thick disfigured discolored nails with subungual debris  from hallux to fifth toes bilaterally. No evidence of bacterial infection or drainage bilaterally.  Orthopedic  No limitations of motion  feet .  No crepitus or effusions noted.  No bony pathology or digital deformities noted.  Skin  normotropic skin with no porokeratosis noted bilaterally.  No signs of infections or ulcers noted.     Onychomycosis  Pain in right toes  Pain in left toes  Consent was obtained for treatment procedures.   Mechanical debridement of nails 1-5  bilaterally performed with a nail nipper.  Filed with dremel without incident.    Return office visit   3 months                  Told patient to return for periodic foot care and evaluation due to potential at risk complications.   Helane Gunther DPM

## 2023-04-12 ENCOUNTER — Other Ambulatory Visit: Payer: Self-pay

## 2023-04-25 DIAGNOSIS — H43393 Other vitreous opacities, bilateral: Secondary | ICD-10-CM | POA: Diagnosis not present

## 2023-04-25 DIAGNOSIS — Z83511 Family history of glaucoma: Secondary | ICD-10-CM | POA: Diagnosis not present

## 2023-04-25 DIAGNOSIS — E119 Type 2 diabetes mellitus without complications: Secondary | ICD-10-CM | POA: Diagnosis not present

## 2023-04-25 DIAGNOSIS — H2513 Age-related nuclear cataract, bilateral: Secondary | ICD-10-CM | POA: Diagnosis not present

## 2023-04-25 DIAGNOSIS — H52223 Regular astigmatism, bilateral: Secondary | ICD-10-CM | POA: Diagnosis not present

## 2023-04-25 DIAGNOSIS — H31002 Unspecified chorioretinal scars, left eye: Secondary | ICD-10-CM | POA: Diagnosis not present

## 2023-04-25 LAB — HM DIABETES EYE EXAM

## 2023-04-27 DIAGNOSIS — R42 Dizziness and giddiness: Secondary | ICD-10-CM | POA: Diagnosis not present

## 2023-04-27 DIAGNOSIS — R4189 Other symptoms and signs involving cognitive functions and awareness: Secondary | ICD-10-CM | POA: Diagnosis not present

## 2023-04-27 DIAGNOSIS — Z8674 Personal history of sudden cardiac arrest: Secondary | ICD-10-CM | POA: Diagnosis not present

## 2023-05-04 ENCOUNTER — Ambulatory Visit (INDEPENDENT_AMBULATORY_CARE_PROVIDER_SITE_OTHER): Payer: Medicare Other | Admitting: Internal Medicine

## 2023-05-04 ENCOUNTER — Inpatient Hospital Stay: Payer: Medicare Other

## 2023-05-04 ENCOUNTER — Encounter: Payer: Self-pay | Admitting: Internal Medicine

## 2023-05-04 ENCOUNTER — Other Ambulatory Visit: Payer: Self-pay

## 2023-05-04 VITALS — BP 112/70 | HR 70 | Temp 98.1°F | Ht 69.0 in | Wt 191.2 lb

## 2023-05-04 DIAGNOSIS — N1832 Chronic kidney disease, stage 3b: Secondary | ICD-10-CM

## 2023-05-04 DIAGNOSIS — I1310 Hypertensive heart and chronic kidney disease without heart failure, with stage 1 through stage 4 chronic kidney disease, or unspecified chronic kidney disease: Secondary | ICD-10-CM | POA: Insufficient documentation

## 2023-05-04 DIAGNOSIS — Z23 Encounter for immunization: Secondary | ICD-10-CM

## 2023-05-04 DIAGNOSIS — E1122 Type 2 diabetes mellitus with diabetic chronic kidney disease: Secondary | ICD-10-CM

## 2023-05-04 DIAGNOSIS — E782 Mixed hyperlipidemia: Secondary | ICD-10-CM

## 2023-05-04 DIAGNOSIS — I480 Paroxysmal atrial fibrillation: Secondary | ICD-10-CM | POA: Diagnosis not present

## 2023-05-04 DIAGNOSIS — I130 Hypertensive heart and chronic kidney disease with heart failure and stage 1 through stage 4 chronic kidney disease, or unspecified chronic kidney disease: Secondary | ICD-10-CM | POA: Diagnosis not present

## 2023-05-04 DIAGNOSIS — I5022 Chronic systolic (congestive) heart failure: Secondary | ICD-10-CM | POA: Diagnosis not present

## 2023-05-04 DIAGNOSIS — D6869 Other thrombophilia: Secondary | ICD-10-CM

## 2023-05-04 DIAGNOSIS — I129 Hypertensive chronic kidney disease with stage 1 through stage 4 chronic kidney disease, or unspecified chronic kidney disease: Secondary | ICD-10-CM | POA: Insufficient documentation

## 2023-05-04 DIAGNOSIS — M109 Gout, unspecified: Secondary | ICD-10-CM

## 2023-05-04 LAB — HEMOGLOBIN A1C
Est. average glucose Bld gHb Est-mCnc: 131 mg/dL
Hgb A1c MFr Bld: 6.2 % — ABNORMAL HIGH (ref 4.8–5.6)

## 2023-05-04 MED ORDER — ALLOPURINOL 100 MG PO TABS: ORAL_TABLET | ORAL | 1 refills | Status: DC

## 2023-05-04 MED ORDER — TETANUS-DIPHTH-ACELL PERTUSSIS 5-2.5-18.5 LF-MCG/0.5 IM SUSP: 0.5000 mL | Freq: Once | INTRAMUSCULAR | 0 refills | Status: AC

## 2023-05-04 NOTE — Progress Notes (Signed)
I,Victoria T Deloria Lair, CMA,acting as a Neurosurgeon for Gwynneth Aliment, MD.,have documented all relevant documentation on the behalf of Gwynneth Aliment, MD,as directed by  Gwynneth Aliment, MD while in the presence of Gwynneth Aliment, MD.  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   MRN: [id number]  Chief Complaint  Patient presents with   Diabetes   Hypertension   Hypothyroidism    HPI  The patient is here for a follow-up on his diabetes, blood pressure & chol. He reports compliance with BP/heart failure meds. Denies headaches, cp and SOB.    He reports completing DM eye exam. He denies h/o retinopathy.  Transactrx rejected for Tdap & Shingrix .  AWV scheduled for September.     Diabetes He presents for his follow-up diabetic visit. He has type 2 diabetes mellitus. There are no hypoglycemic associated symptoms. Pertinent negatives for diabetes include no blurred vision and no chest pain. There are no hypoglycemic complications. There are no diabetic complications. Pertinent negatives for diabetic complications include no CVA. Risk factors for coronary artery disease include obesity and sedentary lifestyle. Current diabetic treatment includes oral agent (dual therapy). He is following a diabetic diet. Meal planning includes avoidance of concentrated sweets. He participates in exercise intermittently. His breakfast blood glucose is taken between 8-9 am. His breakfast blood glucose range is generally 110-130 mg/dl. An ACE inhibitor/angiotensin II receptor blocker is not being taken. He sees a podiatrist.Eye exam is not current.  Hypertension This is a chronic problem. The current episode started more than 1 year ago. The problem has been gradually improving since onset. The problem is controlled. Pertinent negatives include no blurred vision, chest pain, palpitations or shortness of breath. Risk factors for coronary artery disease include diabetes mellitus,  dyslipidemia, sedentary lifestyle, obesity, male gender and stress. Past treatments include beta blockers and diuretics. The current treatment provides moderate improvement. Hypertensive end-organ damage includes kidney disease. There is no history of CVA.  Hyperlipidemia This is a chronic problem. The current episode started more than 1 year ago. Exacerbating diseases include diabetes and hypothyroidism. He has no history of obesity. Pertinent negatives include no chest pain or shortness of breath. Current antihyperlipidemic treatment includes statins.     Past Medical History:  Diagnosis Date   Acute blood loss anemia    Acute encephalopathy    Acute hypoxemic respiratory failure (HCC)    Acute idiopathic gout of right ankle    Acute lumbar back pain    Acute on chronic systolic heart failure (HCC)    Acute respiratory failure (HCC)    AKI (acute kidney injury) (HCC)    Altered mental status    Aortic aneurysm without rupture (HCC) 02/09/2016   Aortic root enlargement (HCC) 02/13/2012   Aortic valve regurgitation, acquired 02/13/2012   Arrhythmia    Atrial flutter (HCC) 03/18/2012   Back pain    Cardiac arrest (HCC) 02/01/2016   CHF (congestive heart failure) (HCC)    Chronic anticoagulation 03/04/2013   Chronic kidney disease    kidney fx studies increased    Chronic lower back pain    Chronic renal insufficiency    Chronic systolic heart failure (HCC) 02/13/2012   Recent diagnosis 4 / 2013, LVEF 25% by Echo 12/2011  03/2013: Echo at [facility] Conclusions: 1. Left ventricular ejection fraction estimated by 2D at 40-45 percent. 2. Mild concentric left ventricular hypertrophy. 3. Mild left atrial enlargement. 4. Moderate  aortic valve regurgitation. 5. The aortic root at the sinus(es) of valsalva is moderately dilated 6. Mild mitral valve regurgitation. 7.   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    Goals of care, counseling/discussion 05/05/2020   HCAP (healthcare-associated pneumonia)    Heart murmur    Hyperlipidemia 02/13/2012   Hypertension    Hypothyroidism    Internal hemorrhoids without mention of complication 04/11/2013   Labile blood pressure    Left bundle branch block 02/13/2012   Leg weakness, bilateral    Long term (current) use of anticoagulants 08/06/2013   Eliquis therapy    Long term current use of amiodarone 08/07/2016   Lower extremity weakness    Migraine 02/13/12   "opthalmic"   Multiple myeloma (HCC) 05/05/2020   Non-traumatic rhabdomyolysis    Obesity (BMI 30-39.9) 02/13/2012   Pain    Paroxysmal atrial fibrillation (HCC)    Pneumonia    Retroperitoneal bleed    Right ankle pain    Right knee pain    Severe aortic regurgitation 02/09/2016   Special screening for malignant neoplasms, colon 04/11/2013   Thyroid activity decreased    Tibial pain    Varicose vein of leg    right   Ventricular fibrillation (HCC) 02/09/2016   Weakness of both lower extremities      Family History  Problem Relation Age of Onset   Hypertension Mother    Heart disease Mother    Heart failure Mother    Diabetes Mother    Hypertension Father    Heart disease Father    Heart failure Father      Current Outpatient Medications:    acetaminophen (TYLENOL) 500 MG tablet, Take 1,000 mg by mouth every 4 (four) hours as needed for moderate pain or headache., Disp: , Rfl:    amiodarone (PACERONE) 100 MG tablet, Take 1 tablet (100 mg total) by mouth daily., Disp: 90 tablet, Rfl: 3   apixaban (ELIQUIS) 5 MG TABS tablet, Take 1 tablet (5 mg total) by mouth 2 (two) times  daily., Disp: 60 tablet, Rfl: 5   carvedilol (COREG) 6.25 MG tablet, Take 1 tablet by mouth twice a day, Disp: 180 tablet, Rfl: 1   dapagliflozin propanediol (FARXIGA) 10 MG TABS tablet, Take 1 tablet (10 mg total) by mouth daily before breakfast., Disp: 30 tablet, Rfl: 5   furosemide (LASIX) 80 MG tablet, Alternate one tablet (80mg ) and half tablet (40mg ) every other day. Okay to take whole tablet as needed for fluid retention, edema., Disp: 90 tablet, Rfl: 3   Hypromellose (ARTIFICIAL TEARS OP), Place 1 drop into both eyes daily as needed (dry eyes)., Disp: , Rfl:    isosorbide-hydrALAZINE (BIDIL) 20-37.5 MG tablet, Take 0.5 tablets by mouth 2 (two) times daily. (Patient taking differently: Take 0.5 tablets by mouth 2 (two) times daily. 0.5 TABLET TWICE A DAY), Disp: 90 tablet, Rfl: 3   levothyroxine (SYNTHROID) 75 MCG tablet, Take 1 tablet (75 mcg total) by mouth daily before breakfast., Disp: 90 tablet, Rfl: 1   potassium  chloride 20 MEQ/15ML (10%) SOLN, TAKE 15 ML BY MOUTH TWICE DAILY (Patient taking differently: daily.), Disp: 946 mL, Rfl: 1   rosuvastatin (CRESTOR) 20 MG tablet, TAKE 1 TABLET(20 MG) BY MOUTH DAILY, Disp: 90 tablet, Rfl: 2   Semaglutide (OZEMPIC, 0.25 OR 0.5 MG/DOSE, [facility]), Inject 0.25 mg into the skin once a week., Disp: , Rfl:    vitamin B-12 (CYANOCOBALAMIN) 100 MCG tablet, Take 100 mcg by mouth daily., Disp: , Rfl:    vitamin C (ASCORBIC ACID) 500 MG tablet, Take 500 mg by mouth daily., Disp: , Rfl:    VITAMIN D PO, Take 1,000 Units by mouth daily., Disp: , Rfl:    allopurinol (ZYLOPRIM) 100 MG tablet, TAKE 1 TABLET BY MOUTH MONDAY-FRIDAY., Disp: 90 tablet, Rfl: 1   famciclovir (FAMVIR) 500 MG tablet, Take 1 tablet (500 mg total) by mouth daily. (Patient not taking: Reported on 01/26/2023), Disp: 30 tablet, Rfl: 3   ferrous sulfate 324 (65 Fe) MG TBEC, Take 1 tablet by mouth daily. (Patient not taking: Reported on 01/26/2023), Disp: , Rfl:    fluticasone (FLONASE) 50 MCG/ACT  nasal spray, Place 2 sprays into both nostrils daily. (Patient not taking: Reported on 03/02/2023), Disp: 16 g, Rfl: 2   loperamide (IMODIUM) 2 MG capsule, Take 2 mg by mouth as needed for diarrhea or loose stools. (Patient not taking: Reported on 03/02/2023), Disp: , Rfl:    predniSONE (DELTASONE) 20 MG tablet, Take 20 mg by mouth daily. (Patient not taking: Reported on 04/06/2023), Disp: , Rfl:    traMADol (ULTRAM) 50 MG tablet, Take 1 tablet (50 mg total) by mouth every 6 (six) hours as needed. (Patient not taking: Reported on 04/06/2023), Disp: 30 tablet, Rfl: 0   Allergies  Allergen Reactions   Lactose Intolerance (Gi) Diarrhea     Review of Systems  Constitutional: Negative.   HENT: Negative.    Eyes: Negative.  Negative for blurred vision.  Respiratory: Negative.  Negative for shortness of breath.   Cardiovascular: Negative.  Negative for chest pain and palpitations.  Gastrointestinal: Negative.   Musculoskeletal: Negative.   Skin: Negative.   Allergic/Immunologic: Negative.   Neurological: Negative.   Hematological: Negative.      Today's Vitals   05/04/23 1551  BP: 112/70  Pulse: 70  Temp: 98.1 F (36.7 C)  SpO2: 98%  Weight: 191 lb 3.2 oz (86.7 kg)  Height: 5\' 9"  (1.753 m)   Body mass index is 28.24 kg/m.  Wt Readings from Last 3 Encounters:  05/04/23 191 lb 3.2 oz (86.7 kg)  04/06/23 190 lb 1.9 oz (86.2 kg)  03/02/23 197 lb 6.4 oz (89.5 kg)     Objective:  Physical Exam Vitals and nursing note reviewed.  Constitutional:      Appearance: Normal appearance.  HENT:     Head: Normocephalic and atraumatic.  Eyes:     Extraocular Movements: Extraocular movements intact.  Cardiovascular:     Rate and Rhythm: Normal rate and regular rhythm.     Heart sounds: Normal heart sounds.  Pulmonary:     Effort: Pulmonary effort is normal.     Breath sounds: Normal breath sounds.  Musculoskeletal:     Cervical back: Normal range of motion.  Skin:    General: Skin is  warm.  Neurological:     General: No focal deficit present.     Mental Status: He is alert.  Psychiatric:        Mood and Affect: Mood normal.  Assessment And Plan:  Type 2 diabetes mellitus with stage 3b chronic kidney disease, without long-term current use of insulin (HCC) Assessment & Plan: Chronic, he wil continue with Farxiga 10mg  daily and Ozempic 0.25mg  weekly. Will adjust Ozempic as needed. He is aware of cardiac/renal benefits of both medications. Will check on Nephrology referral. Reminded to avoid NSAIDS, stay well hydrated and keep BP/BS controlled to decrease risk of CKD progression.   Orders: -     Hemoglobin A1c  Hypertensive heart and renal disease with renal failure, stage 1 through stage 4 or unspecified chronic kidney disease, with heart failure (HCC) Assessment & Plan: Chronic, well controlled.  He will continue wit carvedilol 6.25mg  twice daily and Bidil 20/37.5mg  1/2 tab twice daily.    Paroxysmal atrial fibrillation (HCC) Assessment & Plan: Chronic, currently on Eliquis 5mg  twice daily.    Mixed hyperlipidemia Assessment & Plan: Chronic, LDL goal is less than 70.  He will continue with rosuvastatin 20mg  daily for now. However, due to existing renal function, need to consider switching to atorvastatin. He is encouraged to follow a heart healthy lifestyle.    Acquired thrombophilia (HCC) Assessment & Plan: He is currently on Eliquis 5mg  twice daily due to underlying PAF.    Immunization due  Other orders -     Tetanus-Diphth-Acell Pertussis; Inject 0.5 mLs into the muscle once for 1 dose.  Dispense: 0.5 mL; Refill: 0     Return move Sept to Nov 2024 DM check, keep AWV.  Patient was given opportunity to ask questions. Patient verbalized understanding of the plan and was able to repeat key elements of the plan. All questions were answered to their satisfaction.   I, Gwynneth Aliment, MD, have reviewed all documentation for this visit. The  documentation on 05/04/23 for the exam, diagnosis, procedures, and orders are all accurate and complete.   IF YOU HAVE BEEN REFERRED TO A SPECIALIST, IT MAY TAKE 1-2 WEEKS TO SCHEDULE/PROCESS THE REFERRAL. IF YOU HAVE NOT HEARD FROM US/SPECIALIST IN TWO WEEKS, PLEASE GIVE [place] A CALL AT [phone].   THE PATIENT IS ENCOURAGED TO PRACTICE SOCIAL DISTANCING DUE TO THE COVID-19 PANDEMIC.

## 2023-05-04 NOTE — Patient Instructions (Signed)

## 2023-05-05 ENCOUNTER — Other Ambulatory Visit: Payer: Self-pay

## 2023-05-09 DIAGNOSIS — R4189 Other symptoms and signs involving cognitive functions and awareness: Secondary | ICD-10-CM | POA: Diagnosis not present

## 2023-05-11 ENCOUNTER — Inpatient Hospital Stay: Payer: Medicare Other

## 2023-05-14 DIAGNOSIS — D6869 Other thrombophilia: Secondary | ICD-10-CM | POA: Insufficient documentation

## 2023-05-14 NOTE — Assessment & Plan Note (Signed)
Chronic, LDL goal is less than 70.  He will continue with rosuvastatin 20mg  daily for now. However, due to existing renal function, need to consider switching to atorvastatin. He is encouraged to follow a heart healthy lifestyle.

## 2023-05-14 NOTE — Assessment & Plan Note (Signed)
He is currently on Eliquis 5mg  twice daily due to underlying PAF.

## 2023-05-14 NOTE — Assessment & Plan Note (Signed)
Chronic, he wil continue with [place] 10mg  daily and Ozempic 0.25mg  weekly. Will adjust Ozempic as needed. He is aware of cardiac/renal benefits of both medications. Will check on Nephrology referral. Reminded to avoid NSAIDS, stay well hydrated and keep BP/BS controlled to decrease risk of CKD progression.

## 2023-05-14 NOTE — Assessment & Plan Note (Signed)
Chronic, currently on Eliquis 5mg  twice daily.

## 2023-05-14 NOTE — Assessment & Plan Note (Signed)
Chronic, well controlled.  He will continue wit carvedilol 6.25mg  twice daily and Bidil 20/37.5mg  1/2 tab twice daily.

## 2023-05-16 NOTE — Assessment & Plan Note (Signed)
Chronic, appears euvolemic.  Importance of dietary/medication compliance was discussed with the patient. He will continue with carvedilol 6.25mg  twice daily, Bidil 1/2 tab twice daily and furosemide 80mg  alternating with 40mg  daily.

## 2023-05-21 DIAGNOSIS — R4189 Other symptoms and signs involving cognitive functions and awareness: Secondary | ICD-10-CM | POA: Diagnosis not present

## 2023-05-31 ENCOUNTER — Other Ambulatory Visit: Payer: Self-pay

## 2023-05-31 DIAGNOSIS — N1832 Chronic kidney disease, stage 3b: Secondary | ICD-10-CM

## 2023-05-31 MED ORDER — SEMAGLUTIDE(0.25 OR 0.5MG/DOS) 2 MG/3ML ~~LOC~~ SOPN
PEN_INJECTOR | SUBCUTANEOUS | 1 refills | Status: DC
Start: 2023-05-31 — End: 2023-08-31

## 2023-05-31 NOTE — Progress Notes (Signed)
I,Brendalee Matthies T Deloria Lair, CMA,acting as a Neurosurgeon for [place], CMA.,have documented all relevant documentation on the behalf of [place] Breeze, [place] directed by  [place]idge Breeze, CMA while in the presence of [place] Breeze, [place].  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   MRN: [id number]  No chief complaint on file.   HPI  HPI   Past Medical History:  Diagnosis Date   Acute blood loss anemia    Acute encephalopathy    Acute hypoxemic respiratory failure (HCC)    Acute idiopathic gout of right ankle    Acute lumbar back pain    Acute on chronic systolic heart failure (HCC)    Acute respiratory failure (HCC)    AKI (acute kidney injury) (HCC)    Altered mental status    Aortic aneurysm without rupture (HCC) 02/09/2016   Aortic root enlargement (HCC) 02/13/2012   Aortic valve regurgitation, acquired 02/13/2012   Arrhythmia    Atrial flutter (HCC) 03/18/2012   Back pain    Cardiac arrest (HCC) 02/01/2016   CHF (congestive heart failure) (HCC)    Chronic anticoagulation 03/04/2013   Chronic kidney disease    kidney fx studies increased    Chronic lower back pain    Chronic renal insufficiency    Chronic systolic heart failure (HCC) 02/13/2012   Recent diagnosis 4 / 2013, LVEF 25% by Echo 12/2011  03/2013: Echo at [facility]iology Conclusions: 1. Left ventricular ejection fraction estimated by 2D at 40-45 percent. 2. Mild concentric left ventricular hypertrophy. 3. Mild left atrial enlargement. 4. Moderate aortic valve regurgitation. 5. The aortic root at the sinus(es) of valsalva is moderately dilated 6. Mild mitral valve regurgitation. 7.   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    Goals of care, counseling/discussion 05/05/2020   HCAP (healthcare-associated pneumonia)    Heart murmur    Hyperlipidemia 02/13/2012   Hypertension    Hypothyroidism    Internal hemorrhoids without mention of complication 04/11/2013   Labile blood pressure    Left bundle branch block 02/13/2012   Leg weakness, bilateral    Long term (current) use of anticoagulants 08/06/2013   Eliquis therapy    Long term current use of amiodarone 08/07/2016   Lower extremity weakness    Migraine 02/13/12   "opthalmic"   Multiple myeloma (HCC) 05/05/2020   Non-traumatic rhabdomyolysis    Obesity (BMI 30-39.9) 02/13/2012   Pain    Paroxysmal atrial fibrillation (HCC)    Pneumonia    Retroperitoneal bleed    Right ankle pain    Right knee pain    Severe aortic regurgitation 02/09/2016   Special screening for malignant neoplasms, colon 04/11/2013   Thyroid activity decreased    Tibial pain    Varicose vein of leg    right   Ventricular fibrillation (HCC) 02/09/2016   Weakness of both lower extremities      Family History  Problem Relation Age of Onset   Hypertension  Mother    Heart disease Mother    Heart failure Mother    Diabetes Mother    Hypertension Father    Heart disease Father    Heart failure Father      Current Outpatient Medications:    acetaminophen (TYLENOL) 500 MG tablet, Take 1,000 mg by mouth every 4 (four) hours as needed for moderate pain or headache., Disp: , Rfl:    allopurinol (ZYLOPRIM) 100 MG tablet, TAKE 1 TABLET BY MOUTH MONDAY-FRIDAY., Disp: 90 tablet, Rfl: 1   amiodarone (PACERONE) 100 MG tablet, Take 1 tablet (100 mg total) by mouth daily., Disp: 90 tablet, Rfl: 3   apixaban (ELIQUIS) 5 MG TABS tablet, Take 1 tablet (5 mg total) by mouth 2 (two) times daily., Disp: 60 tablet, Rfl: 5   carvedilol (COREG) 6.25  MG tablet, Take 1 tablet by mouth twice a day, Disp: 180 tablet, Rfl: 1   dapagliflozin propanediol (FARXIGA) 10 MG TABS tablet, Take 1 tablet (10 mg total) by mouth daily before breakfast., Disp: 30 tablet, Rfl: 5   famciclovir (FAMVIR) 500 MG tablet, Take 1 tablet (500 mg total) by mouth daily. (Patient not taking: Reported on 01/26/2023), Disp: 30 tablet, Rfl: 3   ferrous sulfate 324 (65 Fe) MG TBEC, Take 1 tablet by mouth daily. (Patient not taking: Reported on 01/26/2023), Disp: , Rfl:    fluticasone (FLONASE) 50 MCG/ACT nasal spray, Place 2 sprays into both nostrils daily. (Patient not taking: Reported on 03/02/2023), Disp: 16 g, Rfl: 2   furosemide (LASIX) 80 MG tablet, Alternate one tablet (80mg ) and half tablet (40mg ) every other day. Okay to take whole tablet as needed for fluid retention, edema., Disp: 90 tablet, Rfl: 3   Hypromellose (ARTIFICIAL TEARS OP), Place 1 drop into both eyes daily as needed (dry eyes)., Disp: , Rfl:    isosorbide-hydrALAZINE (BIDIL) 20-37.5 MG tablet, Take 0.5 tablets by mouth 2 (two) times daily. (Patient taking differently: Take 0.5 tablets by mouth 2 (two) times daily. 0.5 TABLET TWICE A DAY), Disp: 90 tablet, Rfl: 3   levothyroxine (SYNTHROID) 75 MCG tablet, Take 1 tablet (75 mcg total) by mouth daily before breakfast., Disp: 90 tablet, Rfl: 1   loperamide (IMODIUM) 2 MG capsule, Take 2 mg by mouth as needed for diarrhea or loose stools. (Patient not taking: Reported on 03/02/2023), Disp: , Rfl:    potassium chloride 20 MEQ/15ML (10%) SOLN, TAKE 15 ML BY MOUTH TWICE DAILY (Patient taking differently: daily.), Disp: 946 mL, Rfl: 1   predniSONE (DELTASONE) 20 MG tablet, Take 20 mg by mouth daily. (Patient not taking: Reported on 04/06/2023), Disp: , Rfl:    rosuvastatin (CRESTOR) 20 MG tablet, TAKE 1 TABLET(20 MG) BY MOUTH DAILY, Disp: 90 tablet, Rfl: 2   Semaglutide (OZEMPIC, 0.25 OR 0.5 MG/DOSE, [facility]), Inject 0.25 mg into the skin once a week., Disp: , Rfl:    traMADol  (ULTRAM) 50 MG tablet, Take 1 tablet (50 mg total) by mouth every 6 (six) hours as needed. (Patient not taking: Reported on 04/06/2023), Disp: 30 tablet, Rfl: 0   vitamin B-12 (CYANOCOBALAMIN) 100 MCG tablet, Take 100 mcg by mouth daily., Disp: , Rfl:    vitamin C (ASCORBIC ACID) 500 MG tablet, Take 500 mg by mouth daily., Disp: , Rfl:    VITAMIN D PO, Take 1,000 Units by mouth daily., Disp: , Rfl:    Allergies  Allergen Reactions   Lactose Intolerance (Gi) Diarrhea     Review of Systems   There were  no vitals filed for this visit. There is no height or weight on file to calculate BMI.  Wt Readings from Last 3 Encounters:  05/04/23 191 lb 3.2 oz (86.7 kg)  04/06/23 190 lb 1.9 oz (86.2 kg)  03/02/23 197 lb 6.4 oz (89.5 kg)     Objective:  Physical Exam      Assessment And Plan:  There are no diagnoses linked to this encounter.   No follow-ups on file.  Patient was given opportunity to ask questions. Patient verbalized understanding of the plan and was able to repeat key elements of the plan. All questions were answered to their satisfaction.  Coolidge Breeze, CMA  I, Coolidge Breeze, CMA, have reviewed all documentation for this visit. The documentation on 05/31/23 for the exam, diagnosis, procedures, and orders are all accurate and complete.   IF YOU HAVE BEEN REFERRED TO A SPECIALIST, IT MAY TAKE 1-2 WEEKS TO SCHEDULE/PROCESS THE REFERRAL. IF YOU HAVE NOT HEARD FROM US/SPECIALIST IN TWO WEEKS, PLEASE GIVE [place] A CALL AT [phone].   THE PATIENT IS ENCOURAGED TO PRACTICE SOCIAL DISTANCING DUE TO THE COVID-19 PANDEMIC.

## 2023-06-01 ENCOUNTER — Inpatient Hospital Stay: Payer: Medicare Other | Attending: Hematology & Oncology | Admitting: Hematology & Oncology

## 2023-06-01 ENCOUNTER — Inpatient Hospital Stay: Payer: Medicare Other

## 2023-06-01 ENCOUNTER — Inpatient Hospital Stay: Payer: Medicare Other | Attending: Hematology & Oncology

## 2023-06-01 ENCOUNTER — Encounter: Payer: Self-pay | Admitting: Hematology & Oncology

## 2023-06-01 VITALS — BP 123/78 | HR 68 | Temp 97.9°F | Resp 18 | Wt 193.0 lb

## 2023-06-01 DIAGNOSIS — N1832 Chronic kidney disease, stage 3b: Secondary | ICD-10-CM | POA: Insufficient documentation

## 2023-06-01 DIAGNOSIS — C9000 Multiple myeloma not having achieved remission: Secondary | ICD-10-CM | POA: Insufficient documentation

## 2023-06-01 DIAGNOSIS — M7989 Other specified soft tissue disorders: Secondary | ICD-10-CM | POA: Insufficient documentation

## 2023-06-01 DIAGNOSIS — Z79899 Other long term (current) drug therapy: Secondary | ICD-10-CM | POA: Insufficient documentation

## 2023-06-01 DIAGNOSIS — C9001 Multiple myeloma in remission: Secondary | ICD-10-CM | POA: Diagnosis not present

## 2023-06-01 DIAGNOSIS — Z7984 Long term (current) use of oral hypoglycemic drugs: Secondary | ICD-10-CM | POA: Diagnosis not present

## 2023-06-01 DIAGNOSIS — Z992 Dependence on renal dialysis: Secondary | ICD-10-CM | POA: Insufficient documentation

## 2023-06-01 LAB — CBC WITH DIFFERENTIAL (CANCER CENTER ONLY)
Abs Immature Granulocytes: 0.02 10*3/uL (ref 0.00–0.07)
Basophils Absolute: 0 10*3/uL (ref 0.0–0.1)
Basophils Relative: 1 %
Eosinophils Absolute: 0.3 10*3/uL (ref 0.0–0.5)
Eosinophils Relative: 4 %
HCT: 42.5 % (ref 39.0–52.0)
Hemoglobin: 13.7 g/dL (ref 13.0–17.0)
Immature Granulocytes: 0 %
Lymphocytes Relative: 9 %
Lymphs Abs: 0.6 10*3/uL — ABNORMAL LOW (ref 0.7–4.0)
MCH: 31 pg (ref 26.0–34.0)
MCHC: 32.2 g/dL (ref 30.0–36.0)
MCV: 96.2 fL (ref 80.0–100.0)
Monocytes Absolute: 0.7 10*3/uL (ref 0.1–1.0)
Monocytes Relative: 10 %
Neutro Abs: 5 10*3/uL (ref 1.7–7.7)
Neutrophils Relative %: 76 %
Platelet Count: 129 10*3/uL — ABNORMAL LOW (ref 150–400)
RBC: 4.42 MIL/uL (ref 4.22–5.81)
RDW: 15.4 % (ref 11.5–15.5)
WBC Count: 6.6 10*3/uL (ref 4.0–10.5)
nRBC: 0 % (ref 0.0–0.2)

## 2023-06-01 LAB — CMP (CANCER CENTER ONLY)
ALT: 19 U/L (ref 0–44)
AST: 21 U/L (ref 15–41)
Albumin: 3.7 g/dL (ref 3.5–5.0)
Alkaline Phosphatase: 77 U/L (ref 38–126)
Anion gap: 8 (ref 5–15)
BUN: 34 mg/dL — ABNORMAL HIGH (ref 8–23)
CO2: 24 mmol/L (ref 22–32)
Calcium: 8.8 mg/dL — ABNORMAL LOW (ref 8.9–10.3)
Chloride: 108 mmol/L (ref 98–111)
Creatinine: 2.01 mg/dL — ABNORMAL HIGH (ref 0.61–1.24)
GFR, Estimated: 35 mL/min — ABNORMAL LOW (ref >60)
Glucose, Bld: 137 mg/dL — ABNORMAL HIGH (ref 70–99)
Potassium: 3.7 mmol/L (ref 3.5–5.1)
Sodium: 140 mmol/L (ref 135–145)
Total Bilirubin: 0.9 mg/dL (ref 0.3–1.2)
Total Protein: 5.7 g/dL — ABNORMAL LOW (ref 6.5–8.1)

## 2023-06-01 LAB — LACTATE DEHYDROGENASE: LDH: 206 U/L — ABNORMAL HIGH (ref 98–192)

## 2023-06-01 NOTE — Progress Notes (Signed)
Hematology and Oncology Follow Up Visit  Watts Wirkkala., MD [id number] 1951-06-23 72 y.o. 06/01/2023   Principle Diagnosis:  IgG kappa myeloma  - 1q+   Current Therapy:        CyBorD -- s/p cycle  #19 - started on 06/04/2020 --last treatment in 08/2022 --on hold for now.   Interim History:  Mr. Allgire is here today for follow-up.  He looks quite good.  I am very impressed with how well he is doing.  He has been off therapy for 9 months.  We are just watching him for right now.    When we last saw him, there was no monoclonal spike in his blood.  His IgG level was 790 mg/dL.  The kappa light chain was 1.7 mg/dL.  He is still an ophthalmologist.  He is not doing surgery right now.  He is having to take care of his poor wife.  Thankfully, it sounds like she is doing a bit better.  + She has her challenges.  She is getting dialysis.  He takes her to dialysis when she has her appointments.    He has had no issues with fever.  He has had no cough or shortness of breath.  Thankfully, the been no problems with COVID.    He has had no change in bowel or bladder habits.  There is been no nausea or vomiting.  He has had no problems with leg swelling.  There is no neuropathy.  Overall, I would say his performance status is probably ECOG 1.     Medications:  Allergies as of 06/01/2023       Reactions   Lactose Intolerance (gi) Diarrhea        Medication List        Accurate as of June 01, 2023 12:54 PM. If you have any questions, ask your nurse or doctor.          acetaminophen 500 MG tablet Commonly known as: TYLENOL Take 1,000 mg by mouth every 4 (four) hours as needed for moderate pain or headache.   allopurinol 100 MG tablet Commonly known as: Zyloprim TAKE 1 TABLET BY MOUTH MONDAY-FRIDAY.   amiodarone 100 MG tablet Commonly known as: PACERONE Take 1 tablet (100 mg total) by mouth daily.   apixaban 5 MG Tabs tablet Commonly known as: Eliquis Take 1 tablet (5  mg total) by mouth 2 (two) times daily.   ARTIFICIAL TEARS OP Place 1 drop into both eyes daily as needed (dry eyes).   ascorbic acid 500 MG tablet Commonly known as: VITAMIN C Take 500 mg by mouth daily.   carvedilol 6.25 MG tablet Commonly known as: COREG Take 1 tablet by mouth twice a day   dapagliflozin propanediol 10 MG Tabs tablet Commonly known as: Farxiga Take 1 tablet (10 mg total) by mouth daily before breakfast.   famciclovir 500 MG tablet Commonly known as: FAMVIR Take 1 tablet (500 mg total) by mouth daily.   ferrous sulfate 324 (65 Fe) MG Tbec Take 1 tablet by mouth daily.   fluticasone 50 MCG/ACT nasal spray Commonly known as: FLONASE Place 2 sprays into both nostrils daily.   furosemide 80 MG tablet Commonly known as: LASIX Alternate one tablet (80mg ) and half tablet (40mg ) every other day. Okay to take whole tablet as needed for fluid retention, edema.   isosorbide-hydrALAZINE 20-37.5 MG tablet Commonly known as: BIDIL Take 0.5 tablets by mouth 2 (two) times daily. What changed: additional instructions   levothyroxine  75 MCG tablet Commonly known as: SYNTHROID Take 1 tablet (75 mcg total) by mouth daily before breakfast.   loperamide 2 MG capsule Commonly known as: IMODIUM Take 2 mg by mouth as needed for diarrhea or loose stools.   OZEMPIC (0.25 OR 0.5 MG/DOSE) [place] Inject 0.25 mg into the skin once a week.   Semaglutide(0.25 or 0.5MG /DOS) 2 MG/3ML Sopn Inject 0.5 into the skin once weekly.   potassium chloride 20 MEQ/15ML (10%) Soln TAKE 15 ML BY MOUTH TWICE DAILY What changed: See the new instructions.   predniSONE 20 MG tablet Commonly known as: DELTASONE Take 20 mg by mouth daily.   rosuvastatin 20 MG tablet Commonly known as: CRESTOR TAKE 1 TABLET(20 MG) BY MOUTH DAILY   traMADol 50 MG tablet Commonly known as: Ultram Take 1 tablet (50 mg total) by mouth every 6 (six) hours as needed.   vitamin B-12 100 MCG tablet Commonly known  as: CYANOCOBALAMIN Take 100 mcg by mouth daily.   VITAMIN D PO Take 1,000 Units by mouth daily.        Allergies:  Allergies  Allergen Reactions   Lactose Intolerance (Gi) Diarrhea    Past Medical History, Surgical history, Social history, and Family History were reviewed and updated.  Review of Systems: Review of Systems  Constitutional: Negative.   Eyes: Negative.   Respiratory: Negative.    Cardiovascular:  Positive for leg swelling.  Gastrointestinal: Negative.   Genitourinary: Negative.   Musculoskeletal: Negative.   Skin: Negative.   Neurological: Negative.   Endo/Heme/Allergies: Negative.   Psychiatric/Behavioral: Negative.       Physical Exam:  weight is 193 lb (87.5 kg). His oral temperature is 97.9 F (36.6 C). His blood pressure is 123/78 and his pulse is 68. His respiration is 18 and oxygen saturation is 100%.   Wt Readings from Last 3 Encounters:  06/01/23 193 lb (87.5 kg)  05/04/23 191 lb 3.2 oz (86.7 kg)  04/06/23 190 lb 1.9 oz (86.2 kg)    Physical Exam Vitals reviewed.  HENT:     Head: Normocephalic and atraumatic.  Eyes:     Pupils: Pupils are equal, round, and reactive to light.  Cardiovascular:     Rate and Rhythm: Normal rate and regular rhythm.     Heart sounds: Normal heart sounds.  Pulmonary:     Effort: Pulmonary effort is normal.     Breath sounds: Normal breath sounds.  Abdominal:     General: Bowel sounds are normal.     Palpations: Abdomen is soft.  Musculoskeletal:        General: No tenderness or deformity. Normal range of motion.     Cervical back: Normal range of motion.  Lymphadenopathy:     Cervical: No cervical adenopathy.  Skin:    General: Skin is warm and dry.     Findings: No erythema or rash.  Neurological:     Mental Status: He is alert and oriented to person, place, and time.  Psychiatric:        Behavior: Behavior normal.        Thought Content: Thought content normal.        Judgment: Judgment normal.     Lab Results  Component Value Date   WBC 6.6 06/01/2023   HGB 13.7 06/01/2023   HCT 42.5 06/01/2023   MCV 96.2 06/01/2023   PLT 129 (L) 06/01/2023   Lab Results  Component Value Date   FERRITIN 56 09/15/2022   IRON 92 09/15/2022  TIBC 349 09/15/2022   UIBC 257 09/15/2022   IRONPCTSAT 26 09/15/2022   Lab Results  Component Value Date   RETICCTPCT 1.4 09/15/2022   RBC 4.42 06/01/2023   Lab Results  Component Value Date   KPAFRELGTCHN 17.1 03/02/2023   LAMBDASER 21.6 03/02/2023   KAPLAMBRATIO 0.79 03/02/2023   Lab Results  Component Value Date   IGGSERUM 790 03/02/2023   IGA 262 03/02/2023   IGMSERUM 57 03/02/2023   Lab Results  Component Value Date   TOTALPROTELP 5.4 (L) 03/02/2023   ALBUMINELP 3.3 03/02/2023   A1GS 0.2 03/02/2023   A2GS 0.5 03/02/2023   BETS 0.9 03/02/2023   GAMS 0.5 03/02/2023   MSPIKE Not Observed 03/02/2023   SPEI Comment 04/22/2021     Chemistry      Component Value Date/Time   NA 140 06/01/2023 1153   NA 144 08/25/2022 1050   K 3.7 06/01/2023 1153   CL 108 06/01/2023 1153   CO2 24 06/01/2023 1153   BUN 34 (H) 06/01/2023 1153   BUN 37 (H) 08/25/2022 1050   CREATININE 2.01 (H) 06/01/2023 1153   CREATININE 1.95 (H) 09/08/2016 0859      Component Value Date/Time   CALCIUM 8.8 (L) 06/01/2023 1153   ALKPHOS 77 06/01/2023 1153   AST 21 06/01/2023 1153   ALT 19 06/01/2023 1153   BILITOT 0.9 06/01/2023 1153       Impression and Plan: 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  He has done incredibly well with treatment.  Again, he has been off treatment now for about 8 months.  His monoclonal studies have all looked pretty stable.  We will continue to hold his treatments.  If we started to see changes with his myeloma studies, then we can certainly initiate therapy.  I know his priority is taking care of his wife.  I want to make sure that he is able to do this without any  hindrance from [place].  We will now get him back in about 2 months.  I am happy that he is doing so well.  His quality of life is doing nicely.  Again he is able to take care of his wife.   Josph Macho, MD 9/6/202412:54 PM

## 2023-06-03 LAB — IGG, IGA, IGM
IgA: 149 mg/dL (ref 61–437)
IgG (Immunoglobin G), Serum: 577 mg/dL — ABNORMAL LOW (ref 603–1613)
IgM (Immunoglobulin M), Srm: 21 mg/dL (ref 15–143)

## 2023-06-04 LAB — KAPPA/LAMBDA LIGHT CHAINS
Kappa free light chain: 23.4 mg/L — ABNORMAL HIGH (ref 3.3–19.4)
Kappa, lambda light chain ratio: 1.13 (ref 0.26–1.65)
Lambda free light chains: 20.7 mg/L (ref 5.7–26.3)

## 2023-06-06 LAB — PROTEIN ELECTROPHORESIS, SERUM, WITH REFLEX
A/G Ratio: 1.7 (ref 0.7–1.7)
Albumin ELP: 3.3 g/dL (ref 2.9–4.4)
Alpha-1-Globulin: 0.2 g/dL (ref 0.0–0.4)
Alpha-2-Globulin: 0.5 g/dL (ref 0.4–1.0)
Beta Globulin: 0.9 g/dL (ref 0.7–1.3)
Gamma Globulin: 0.4 g/dL (ref 0.4–1.8)
Globulin, Total: 2 g/dL — ABNORMAL LOW (ref 2.2–3.9)
Total Protein ELP: 5.3 g/dL — ABNORMAL LOW (ref 6.0–8.5)

## 2023-06-07 ENCOUNTER — Ambulatory Visit: Payer: Medicare Other | Admitting: Internal Medicine

## 2023-06-07 ENCOUNTER — Ambulatory Visit: Payer: Self-pay | Admitting: Internal Medicine

## 2023-06-07 DIAGNOSIS — I130 Hypertensive heart and chronic kidney disease with heart failure and stage 1 through stage 4 chronic kidney disease, or unspecified chronic kidney disease: Secondary | ICD-10-CM

## 2023-06-07 DIAGNOSIS — I480 Paroxysmal atrial fibrillation: Secondary | ICD-10-CM

## 2023-06-07 DIAGNOSIS — I5022 Chronic systolic (congestive) heart failure: Secondary | ICD-10-CM

## 2023-06-07 DIAGNOSIS — E1122 Type 2 diabetes mellitus with diabetic chronic kidney disease: Secondary | ICD-10-CM

## 2023-06-07 DIAGNOSIS — E782 Mixed hyperlipidemia: Secondary | ICD-10-CM

## 2023-06-07 NOTE — Progress Notes (Deleted)
I,Myrah Strawderman T Deloria Lair, CMA,acting as a Neurosurgeon for Gwynneth Aliment, MD.,have documented all relevant documentation on the behalf of Gwynneth Aliment, MD,as directed by  Gwynneth Aliment, MD while in the presence of Gwynneth Aliment, MD.  Subjective:  Patient ID: Eric [name]., MD , male    DOB: Jan 06, 1951 , 72 y.o.   MRN: [id number]  No chief complaint on file.   HPI  Patient presents today for Ozempic follow up. He reports compliance with medication. Denies headache, chest pain & sob.  Denies nausea, diarrhea & vomiting.      Past Medical History:  Diagnosis Date   Acute blood loss anemia    Acute encephalopathy    Acute hypoxemic respiratory failure (HCC)    Acute idiopathic gout of right ankle    Acute lumbar back pain    Acute on chronic systolic heart failure (HCC)    Acute respiratory failure (HCC)    AKI (acute kidney injury) (HCC)    Altered mental status    Aortic aneurysm without rupture (HCC) 02/09/2016   Aortic root enlargement (HCC) 02/13/2012   Aortic valve regurgitation, acquired 02/13/2012   Arrhythmia    Atrial flutter (HCC) 03/18/2012   Back pain    Cardiac arrest (HCC) 02/01/2016   CHF (congestive heart failure) (HCC)    Chronic anticoagulation 03/04/2013   Chronic kidney disease    kidney fx studies increased    Chronic lower back pain    Chronic renal insufficiency    Chronic systolic heart failure (HCC) 02/13/2012   Recent diagnosis 4 / 2013, LVEF 25% by Echo 12/2011  03/2013: Echo at [facility] Conclusions: 1. Left ventricular ejection fraction estimated by 2D at 40-45 percent. 2. Mild concentric left ventricular hypertrophy. 3. Mild left atrial enlargement. 4. Moderate aortic valve regurgitation. 5. The aortic root at the sinus(es) of valsalva is moderately dilated 6. Mild mitral valve regurgitation. 7.   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    Goals of care, counseling/discussion 05/05/2020   HCAP (healthcare-associated pneumonia)    Heart murmur    Hyperlipidemia 02/13/2012   Hypertension    Hypothyroidism    Internal hemorrhoids without mention of complication 04/11/2013   Labile blood pressure    Left bundle branch block 02/13/2012   Leg weakness, bilateral    Long term (current) use of anticoagulants 08/06/2013   Eliquis therapy    Long term current use of amiodarone 08/07/2016   Lower extremity weakness    Migraine 02/13/12   "opthalmic"   Multiple myeloma (HCC) 05/05/2020   Non-traumatic rhabdomyolysis    Obesity (BMI 30-39.9) 02/13/2012   Pain    Paroxysmal atrial fibrillation (HCC)    Pneumonia    Retroperitoneal bleed    Right ankle pain    Right knee pain    Severe aortic regurgitation 02/09/2016   Special screening for malignant neoplasms, colon 04/11/2013   Thyroid activity decreased    Tibial pain    Varicose vein of leg    right   Ventricular  fibrillation (HCC) 02/09/2016   Weakness of both lower extremities      Family History  Problem Relation Age of Onset   Hypertension Mother    Heart disease Mother    Heart failure Mother    Diabetes Mother    Hypertension Father    Heart disease Father    Heart failure Father      Current Outpatient Medications:    acetaminophen (TYLENOL) 500 MG tablet, Take 1,000 mg by mouth every 4 (four) hours as needed for moderate pain or headache., Disp: , Rfl:    allopurinol (ZYLOPRIM) 100 MG tablet, TAKE 1 TABLET BY MOUTH MONDAY-FRIDAY., Disp: 90 tablet, Rfl: 1   amiodarone (PACERONE) 100 MG tablet, Take 1 tablet (100 mg total) by mouth daily., Disp: 90 tablet, Rfl: 3   apixaban  (ELIQUIS) 5 MG TABS tablet, Take 1 tablet (5 mg total) by mouth 2 (two) times daily., Disp: 60 tablet, Rfl: 5   carvedilol (COREG) 6.25 MG tablet, Take 1 tablet by mouth twice a day, Disp: 180 tablet, Rfl: 1   dapagliflozin propanediol (FARXIGA) 10 MG TABS tablet, Take 1 tablet (10 mg total) by mouth daily before breakfast., Disp: 30 tablet, Rfl: 5   famciclovir (FAMVIR) 500 MG tablet, Take 1 tablet (500 mg total) by mouth daily. (Patient not taking: Reported on 01/26/2023), Disp: 30 tablet, Rfl: 3   ferrous sulfate 324 (65 Fe) MG TBEC, Take 1 tablet by mouth daily. (Patient not taking: Reported on 01/26/2023), Disp: , Rfl:    fluticasone (FLONASE) 50 MCG/ACT nasal spray, Place 2 sprays into both nostrils daily., Disp: 16 g, Rfl: 2   furosemide (LASIX) 80 MG tablet, Alternate one tablet (80mg ) and half tablet (40mg ) every other day. Okay to take whole tablet as needed for fluid retention, edema., Disp: 90 tablet, Rfl: 3   Hypromellose (ARTIFICIAL TEARS OP), Place 1 drop into both eyes daily as needed (dry eyes)., Disp: , Rfl:    isosorbide-hydrALAZINE (BIDIL) 20-37.5 MG tablet, Take 0.5 tablets by mouth 2 (two) times daily. (Patient taking differently: Take 0.5 tablets by mouth 2 (two) times daily. 0.5 TABLET TWICE A DAY), Disp: 90 tablet, Rfl: 3   levothyroxine (SYNTHROID) 75 MCG tablet, Take 1 tablet (75 mcg total) by mouth daily before breakfast., Disp: 90 tablet, Rfl: 1   loperamide (IMODIUM) 2 MG capsule, Take 2 mg by mouth as needed for diarrhea or loose stools., Disp: , Rfl:    potassium chloride 20 MEQ/15ML (10%) SOLN, TAKE 15 ML BY MOUTH TWICE DAILY (Patient taking differently: daily.), Disp: 946 mL, Rfl: 1   predniSONE (DELTASONE) 20 MG tablet, Take 20 mg by mouth daily. (Patient not taking: Reported on 04/06/2023), Disp: , Rfl:    rosuvastatin (CRESTOR) 20 MG tablet, TAKE 1 TABLET(20 MG) BY MOUTH DAILY, Disp: 90 tablet, Rfl: 2   Semaglutide (OZEMPIC, 0.25 OR 0.5 MG/DOSE, [place]), Inject 0.25 mg into  the skin once a week., Disp: , Rfl:    Semaglutide,0.25 or 0.5MG /DOS, 2 MG/3ML SOPN, Inject 0.5 into the skin once weekly., Disp: 3 mL, Rfl: 1   traMADol (ULTRAM) 50 MG tablet, Take 1 tablet (50 mg total) by mouth every 6 (six) hours as needed., Disp: 30 tablet, Rfl: 0   vitamin B-12 (CYANOCOBALAMIN) 100 MCG tablet, Take 100 mcg by mouth daily., Disp: , Rfl:    vitamin C (ASCORBIC ACID) 500 MG tablet, Take 500 mg by mouth daily., Disp: , Rfl:    VITAMIN D PO, Take 1,000 Units by mouth  daily., Disp: , Rfl:    Allergies  Allergen Reactions   Lactose Intolerance (Gi) Diarrhea     Review of Systems  Constitutional: Negative.   HENT: Negative.    Respiratory: Negative.    Cardiovascular: Negative.   Endocrine: Negative.   Skin: Negative.   Allergic/Immunologic: Negative.   Neurological: Negative.   Hematological: Negative.      There were no vitals filed for this visit. There is no height or weight on file to calculate BMI.  Wt Readings from Last 3 Encounters:  06/01/23 193 lb (87.5 kg)  05/04/23 191 lb 3.2 oz (86.7 kg)  04/06/23 190 lb 1.9 oz (86.2 kg)     Objective:  Physical Exam      Assessment And Plan:  Type 2 diabetes mellitus with stage 3b chronic kidney disease, without long-term current use of insulin (HCC)  Hypertensive heart and renal disease with renal failure, stage 1 through stage 4 or unspecified chronic kidney disease, with heart failure (HCC)  Chronic systolic heart failure (HCC)  Paroxysmal atrial fibrillation (HCC)  Mixed hyperlipidemia     No follow-ups on file.  Patient was given opportunity to ask questions. Patient verbalized understanding of the plan and was able to repeat key elements of the plan. All questions were answered to their satisfaction.  Gwynneth Aliment, MD  I, Gwynneth Aliment, MD, have reviewed all documentation for this visit. The documentation on 06/07/23 for the exam, diagnosis, procedures, and orders are all accurate and  complete.   IF YOU HAVE BEEN REFERRED TO A SPECIALIST, IT MAY TAKE 1-2 WEEKS TO SCHEDULE/PROCESS THE REFERRAL. IF YOU HAVE NOT HEARD FROM US/SPECIALIST IN TWO WEEKS, PLEASE GIVE [place] A CALL AT [phone].   THE PATIENT IS ENCOURAGED TO PRACTICE SOCIAL DISTANCING DUE TO THE COVID-19 PANDEMIC.

## 2023-06-12 ENCOUNTER — Ambulatory Visit: Payer: Medicare Other

## 2023-06-12 DIAGNOSIS — I5022 Chronic systolic (congestive) heart failure: Secondary | ICD-10-CM

## 2023-06-12 DIAGNOSIS — I428 Other cardiomyopathies: Secondary | ICD-10-CM

## 2023-06-12 LAB — CUP PACEART REMOTE DEVICE CHECK
Battery Remaining Longevity: 18 mo
Battery Remaining Percentage: 22 %
Battery Voltage: 2.78 V
Date Time Interrogation Session: 20240917041920
HighPow Impedance: 62 Ohm
HighPow Impedance: 62 Ohm
Implantable Lead Connection Status: 753985
Implantable Lead Connection Status: 753985
Implantable Lead Connection Status: 753985
Implantable Lead Implant Date: 20170525
Implantable Lead Implant Date: 20170525
Implantable Lead Implant Date: 20170525
Implantable Lead Location: 753858
Implantable Lead Location: 753859
Implantable Lead Location: 753860
Implantable Lead Model: 7122
Implantable Pulse Generator Implant Date: 20170525
Lead Channel Impedance Value: 380 Ohm
Lead Channel Impedance Value: 440 Ohm
Lead Channel Impedance Value: 600 Ohm
Lead Channel Pacing Threshold Amplitude: 0.5 V
Lead Channel Pacing Threshold Amplitude: 1.25 V
Lead Channel Pacing Threshold Pulse Width: 0.5 ms
Lead Channel Pacing Threshold Pulse Width: 0.5 ms
Lead Channel Sensing Intrinsic Amplitude: 0.2 mV
Lead Channel Sensing Intrinsic Amplitude: 11.4 mV
Lead Channel Setting Pacing Amplitude: 2 V
Lead Channel Setting Pacing Amplitude: 2.5 V
Lead Channel Setting Pacing Pulse Width: 0.5 ms
Lead Channel Setting Pacing Pulse Width: 0.5 ms
Lead Channel Setting Sensing Sensitivity: 0.5 mV
Pulse Gen Serial Number: 7357926
Zone Setting Status: 755011

## 2023-06-13 DIAGNOSIS — R4189 Other symptoms and signs involving cognitive functions and awareness: Secondary | ICD-10-CM | POA: Diagnosis not present

## 2023-06-18 DIAGNOSIS — N1832 Chronic kidney disease, stage 3b: Secondary | ICD-10-CM | POA: Diagnosis not present

## 2023-06-18 DIAGNOSIS — E1122 Type 2 diabetes mellitus with diabetic chronic kidney disease: Secondary | ICD-10-CM | POA: Diagnosis not present

## 2023-06-18 DIAGNOSIS — Z8674 Personal history of sudden cardiac arrest: Secondary | ICD-10-CM | POA: Diagnosis not present

## 2023-06-18 DIAGNOSIS — N2581 Secondary hyperparathyroidism of renal origin: Secondary | ICD-10-CM | POA: Diagnosis not present

## 2023-06-18 DIAGNOSIS — D631 Anemia in chronic kidney disease: Secondary | ICD-10-CM | POA: Diagnosis not present

## 2023-06-18 DIAGNOSIS — R42 Dizziness and giddiness: Secondary | ICD-10-CM | POA: Diagnosis not present

## 2023-06-18 DIAGNOSIS — I129 Hypertensive chronic kidney disease with stage 1 through stage 4 chronic kidney disease, or unspecified chronic kidney disease: Secondary | ICD-10-CM | POA: Diagnosis not present

## 2023-06-19 ENCOUNTER — Other Ambulatory Visit: Payer: Self-pay | Admitting: Nephrology

## 2023-06-19 DIAGNOSIS — N189 Chronic kidney disease, unspecified: Secondary | ICD-10-CM

## 2023-06-19 DIAGNOSIS — N1832 Chronic kidney disease, stage 3b: Secondary | ICD-10-CM

## 2023-06-28 NOTE — Progress Notes (Signed)
Remote ICD transmission.   

## 2023-06-29 ENCOUNTER — Inpatient Hospital Stay (HOSPITAL_BASED_OUTPATIENT_CLINIC_OR_DEPARTMENT_OTHER): Payer: Medicare Other | Attending: Hematology & Oncology | Admitting: Hematology & Oncology

## 2023-06-29 ENCOUNTER — Other Ambulatory Visit: Payer: Self-pay

## 2023-06-29 ENCOUNTER — Inpatient Hospital Stay: Payer: Medicare Other

## 2023-06-29 VITALS — BP 120/64 | HR 64 | Temp 98.4°F | Resp 18 | Ht 69.0 in | Wt 191.0 lb

## 2023-06-29 DIAGNOSIS — M7989 Other specified soft tissue disorders: Secondary | ICD-10-CM | POA: Diagnosis not present

## 2023-06-29 DIAGNOSIS — C9000 Multiple myeloma not having achieved remission: Secondary | ICD-10-CM | POA: Diagnosis not present

## 2023-06-29 DIAGNOSIS — Z7901 Long term (current) use of anticoagulants: Secondary | ICD-10-CM | POA: Diagnosis not present

## 2023-06-29 DIAGNOSIS — C9001 Multiple myeloma in remission: Secondary | ICD-10-CM

## 2023-06-29 LAB — CMP (CANCER CENTER ONLY)
ALT: 16 U/L (ref 0–44)
AST: 21 U/L (ref 15–41)
Albumin: 3.5 g/dL (ref 3.5–5.0)
Alkaline Phosphatase: 74 U/L (ref 38–126)
Anion gap: 9 (ref 5–15)
BUN: 29 mg/dL — ABNORMAL HIGH (ref 8–23)
CO2: 24 mmol/L (ref 22–32)
Calcium: 8.5 mg/dL — ABNORMAL LOW (ref 8.9–10.3)
Chloride: 108 mmol/L (ref 98–111)
Creatinine: 1.98 mg/dL — ABNORMAL HIGH (ref 0.61–1.24)
GFR, Estimated: 35 mL/min — ABNORMAL LOW (ref >60)
Glucose, Bld: 126 mg/dL — ABNORMAL HIGH (ref 70–99)
Potassium: 3.6 mmol/L (ref 3.5–5.1)
Sodium: 141 mmol/L (ref 135–145)
Total Bilirubin: 1.2 mg/dL (ref 0.3–1.2)
Total Protein: 5.9 g/dL — ABNORMAL LOW (ref 6.5–8.1)

## 2023-06-29 LAB — CBC WITH DIFFERENTIAL (CANCER CENTER ONLY)
Abs Immature Granulocytes: 0.01 10*3/uL (ref 0.00–0.07)
Basophils Absolute: 0 10*3/uL (ref 0.0–0.1)
Basophils Relative: 1 %
Eosinophils Absolute: 0.2 10*3/uL (ref 0.0–0.5)
Eosinophils Relative: 3 %
HCT: 42.5 % (ref 39.0–52.0)
Hemoglobin: 14.1 g/dL (ref 13.0–17.0)
Immature Granulocytes: 0 %
Lymphocytes Relative: 13 %
Lymphs Abs: 0.7 10*3/uL (ref 0.7–4.0)
MCH: 30.7 pg (ref 26.0–34.0)
MCHC: 33.2 g/dL (ref 30.0–36.0)
MCV: 92.6 fL (ref 80.0–100.0)
Monocytes Absolute: 0.8 10*3/uL (ref 0.1–1.0)
Monocytes Relative: 15 %
Neutro Abs: 3.4 10*3/uL (ref 1.7–7.7)
Neutrophils Relative %: 68 %
Platelet Count: 154 10*3/uL (ref 150–400)
RBC: 4.59 MIL/uL (ref 4.22–5.81)
RDW: 15.5 % (ref 11.5–15.5)
WBC Count: 5.1 10*3/uL (ref 4.0–10.5)
nRBC: 0 % (ref 0.0–0.2)

## 2023-06-29 LAB — LACTATE DEHYDROGENASE: LDH: 224 U/L — ABNORMAL HIGH (ref 98–192)

## 2023-06-29 NOTE — Progress Notes (Signed)
Hematology and Oncology Follow Up Visit  Eric [name]., MD [id number] 1951/07/10 72 y.o. 06/29/2023   Principle Diagnosis:  IgG kappa myeloma  - 1q+   Current Therapy:        CyBorD -- s/p cycle  #19 - started on 06/04/2020 --last treatment in 08/2022 --on hold for now.   Interim History:  Eric [name] is here today for follow-up.  He looks quite good.  He is still working but not operating.  He does enjoy work.  His wife seems to be doing a little bit better.  I know that she has had her issues.  As far as myeloma is concerned, he is actually done incredibly well with this.  He has had no treatment now for about 10-11 months.  When I saw him, there is no monoclonal spike in his blood.  His IgG level was 577 mg/dL.  The Kappa light chain was 2.3 mg/dL.  He has had no problems with bowels or bladder.  He has had no cough or shortness of breath.  He has had no nausea or vomiting.  He has had no leg swelling.  He has had no bleeding.  There is been no rashes.  Overall, I would say his performance status is probably ECOG 1.      Medications:  Allergies as of 06/29/2023       Reactions   Lactose Intolerance (gi) Diarrhea   Lactose Diarrhea        Medication List        Accurate as of June 29, 2023  1:15 PM. If you have any questions, ask your nurse or doctor.          STOP taking these medications    predniSONE 20 MG tablet Commonly known as: DELTASONE Stopped by: Josph Macho       TAKE these medications    acetaminophen 500 MG tablet Commonly known as: TYLENOL Take 1,000 mg by mouth every 4 (four) hours as needed for moderate pain or headache.   allopurinol 100 MG tablet Commonly known as: Zyloprim TAKE 1 TABLET BY MOUTH MONDAY-FRIDAY. What changed:  how much to take how to take this when to take this additional instructions   amiodarone 100 MG tablet Commonly known as: PACERONE Take 1 tablet (100 mg total) by mouth daily.   apixaban 5 MG  Tabs tablet Commonly known as: Eliquis Take 1 tablet (5 mg total) by mouth 2 (two) times daily.   ARTIFICIAL TEARS OP Place 1 drop into both eyes daily as needed (dry eyes).   ascorbic acid 500 MG tablet Commonly known as: VITAMIN C Take 500 mg by mouth daily.   carvedilol 6.25 MG tablet Commonly known as: COREG Take 1 tablet by mouth twice a day   dapagliflozin propanediol 10 MG Tabs tablet Commonly known as: Farxiga Take 1 tablet (10 mg total) by mouth daily before breakfast.   famciclovir 500 MG tablet Commonly known as: FAMVIR Take 1 tablet (500 mg total) by mouth daily.   ferrous sulfate 324 (65 Fe) MG Tbec Take 1 tablet by mouth daily.   fluticasone 50 MCG/ACT nasal spray Commonly known as: FLONASE Place 2 sprays into both nostrils daily.   furosemide 80 MG tablet Commonly known as: LASIX Alternate one tablet (80mg ) and half tablet (40mg ) every other day. Okay to take whole tablet as needed for fluid retention, edema.   isosorbide-hydrALAZINE 20-37.5 MG tablet Commonly known as: BIDIL Take 0.5 tablets by mouth 2 (two) times  daily. What changed: additional instructions   levothyroxine 75 MCG tablet Commonly known as: SYNTHROID Take 1 tablet (75 mcg total) by mouth daily before breakfast.   loperamide 2 MG capsule Commonly known as: IMODIUM Take 2 mg by mouth as needed for diarrhea or loose stools.   OZEMPIC (0.25 OR 0.5 MG/DOSE) [place] Inject 0.25 mg into the skin once a week.   Semaglutide(0.25 or 0.5MG /DOS) 2 MG/3ML Sopn Inject 0.5 into the skin once weekly.   potassium chloride 20 MEQ/15ML (10%) Soln TAKE 15 ML BY MOUTH TWICE DAILY What changed: See the new instructions.   rosuvastatin 20 MG tablet Commonly known as: CRESTOR TAKE 1 TABLET(20 MG) BY MOUTH DAILY   traMADol 50 MG tablet Commonly known as: Ultram Take 1 tablet (50 mg total) by mouth every 6 (six) hours as needed.   vitamin B-12 100 MCG tablet Commonly known as: CYANOCOBALAMIN Take  100 mcg by mouth daily.   VITAMIN D PO Take 1,000 Units by mouth daily.        Allergies:  Allergies  Allergen Reactions   Lactose Intolerance (Gi) Diarrhea   Lactose Diarrhea    Past Medical History, Surgical history, Social history, and Family History were reviewed and updated.  Review of Systems: Review of Systems  Constitutional: Negative.   Eyes: Negative.   Respiratory: Negative.    Cardiovascular:  Positive for leg swelling.  Gastrointestinal: Negative.   Genitourinary: Negative.   Musculoskeletal: Negative.   Skin: Negative.   Neurological: Negative.   Endo/Heme/Allergies: Negative.   Psychiatric/Behavioral: Negative.       Physical Exam:  height is 5\' 9"  (1.753 m) and weight is 191 lb (86.6 kg). His oral temperature is 98.4 F (36.9 C). His blood pressure is 120/64 and his pulse is 64. His respiration is 18 and oxygen saturation is 97%.   Wt Readings from Last 3 Encounters:  06/29/23 191 lb (86.6 kg)  06/01/23 193 lb (87.5 kg)  05/04/23 191 lb 3.2 oz (86.7 kg)    Physical Exam Vitals reviewed.  HENT:     Head: Normocephalic and atraumatic.  Eyes:     Pupils: Pupils are equal, round, and reactive to light.  Cardiovascular:     Rate and Rhythm: Normal rate and regular rhythm.     Heart sounds: Normal heart sounds.  Pulmonary:     Effort: Pulmonary effort is normal.     Breath sounds: Normal breath sounds.  Abdominal:     General: Bowel sounds are normal.     Palpations: Abdomen is soft.  Musculoskeletal:        General: No tenderness or deformity. Normal range of motion.     Cervical back: Normal range of motion.  Lymphadenopathy:     Cervical: No cervical adenopathy.  Skin:    General: Skin is warm and dry.     Findings: No erythema or rash.  Neurological:     Mental Status: He is alert and oriented to person, place, and time.  Psychiatric:        Behavior: Behavior normal.        Thought Content: Thought content normal.        Judgment:  Judgment normal.    Lab Results  Component Value Date   WBC 5.1 06/29/2023   HGB 14.1 06/29/2023   HCT 42.5 06/29/2023   MCV 92.6 06/29/2023   PLT 154 06/29/2023   Lab Results  Component Value Date   FERRITIN 56 09/15/2022   IRON 92 09/15/2022  TIBC 349 09/15/2022   UIBC 257 09/15/2022   IRONPCTSAT 26 09/15/2022   Lab Results  Component Value Date   RETICCTPCT 1.4 09/15/2022   RBC 4.59 06/29/2023   Lab Results  Component Value Date   KPAFRELGTCHN 23.4 (H) 06/01/2023   LAMBDASER 20.7 06/01/2023   KAPLAMBRATIO 1.13 06/01/2023   Lab Results  Component Value Date   IGGSERUM 577 (L) 06/01/2023   IGA 149 06/01/2023   IGMSERUM 21 06/01/2023   Lab Results  Component Value Date   TOTALPROTELP 5.3 (L) 06/01/2023   ALBUMINELP 3.3 06/01/2023   A1GS 0.2 06/01/2023   A2GS 0.5 06/01/2023   BETS 0.9 06/01/2023   GAMS 0.4 06/01/2023   MSPIKE Not Observed 06/01/2023   SPEI Comment 04/22/2021     Chemistry      Component Value Date/Time   NA 141 06/29/2023 1214   NA 144 08/25/2022 1050   K 3.6 06/29/2023 1214   CL 108 06/29/2023 1214   CO2 24 06/29/2023 1214   BUN 29 (H) 06/29/2023 1214   BUN 37 (H) 08/25/2022 1050   CREATININE 1.98 (H) 06/29/2023 1214   CREATININE 1.95 (H) 09/08/2016 0859      Component Value Date/Time   CALCIUM 8.5 (L) 06/29/2023 1214   ALKPHOS 74 06/29/2023 1214   AST 21 06/29/2023 1214   ALT 16 06/29/2023 1214   BILITOT 1.2 06/29/2023 1214       Impression and Plan: 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  He has done incredibly well with treatment.  Again, he has been off treatment now for about 10 months.  His monoclonal studies have all looked pretty stable.  We will continue to hold his treatments.  If we start to see changes with his myeloma studies, then we can certainly initiate therapy.  I know his priority is taking care of his wife.  I want to make sure that he is able to do this  without any hindrance from [place].  We will now get him back in about 2 months.  I am happy that he is doing so well.  His quality of life is doing nicely.  Again he is able to take care of his wife.   Josph Macho, MD 10/4/20241:15 PM

## 2023-07-01 LAB — IGG, IGA, IGM
IgA: 173 mg/dL (ref 61–437)
IgG (Immunoglobin G), Serum: 664 mg/dL (ref 603–1613)
IgM (Immunoglobulin M), Srm: 25 mg/dL (ref 15–143)

## 2023-07-02 LAB — KAPPA/LAMBDA LIGHT CHAINS
Kappa free light chain: 23.1 mg/L — ABNORMAL HIGH (ref 3.3–19.4)
Kappa, lambda light chain ratio: 0.91 (ref 0.26–1.65)
Lambda free light chains: 25.4 mg/L (ref 5.7–26.3)

## 2023-07-03 LAB — PROTEIN ELECTROPHORESIS, SERUM, WITH REFLEX
A/G Ratio: 1.4 (ref 0.7–1.7)
Albumin ELP: 3.1 g/dL (ref 2.9–4.4)
Alpha-1-Globulin: 0.3 g/dL (ref 0.0–0.4)
Alpha-2-Globulin: 0.5 g/dL (ref 0.4–1.0)
Beta Globulin: 0.9 g/dL (ref 0.7–1.3)
Gamma Globulin: 0.5 g/dL (ref 0.4–1.8)
Globulin, Total: 2.2 g/dL (ref 2.2–3.9)
Total Protein ELP: 5.3 g/dL — ABNORMAL LOW (ref 6.0–8.5)

## 2023-07-17 ENCOUNTER — Other Ambulatory Visit: Payer: Self-pay

## 2023-07-17 DIAGNOSIS — I129 Hypertensive chronic kidney disease with stage 1 through stage 4 chronic kidney disease, or unspecified chronic kidney disease: Secondary | ICD-10-CM

## 2023-07-17 MED ORDER — CARVEDILOL 6.25 MG PO TABS: ORAL_TABLET | ORAL | 1 refills | Status: DC

## 2023-07-20 ENCOUNTER — Encounter: Payer: Self-pay | Admitting: Podiatry

## 2023-07-20 ENCOUNTER — Ambulatory Visit (INDEPENDENT_AMBULATORY_CARE_PROVIDER_SITE_OTHER): Payer: Medicare Other | Admitting: Podiatry

## 2023-07-20 DIAGNOSIS — M79675 Pain in left toe(s): Secondary | ICD-10-CM

## 2023-07-20 DIAGNOSIS — L608 Other nail disorders: Secondary | ICD-10-CM

## 2023-07-20 DIAGNOSIS — B351 Tinea unguium: Secondary | ICD-10-CM

## 2023-07-20 DIAGNOSIS — N1832 Chronic kidney disease, stage 3b: Secondary | ICD-10-CM

## 2023-07-20 DIAGNOSIS — E1122 Type 2 diabetes mellitus with diabetic chronic kidney disease: Secondary | ICD-10-CM

## 2023-07-20 DIAGNOSIS — M79674 Pain in right toe(s): Secondary | ICD-10-CM

## 2023-07-20 DIAGNOSIS — E119 Type 2 diabetes mellitus without complications: Secondary | ICD-10-CM

## 2023-07-20 NOTE — Progress Notes (Signed)
This patient returns to my office for at risk foot care.  This patient requires this care by a professional since this patient will be at risk due to having diabetes type 2, chronic kidney disease and coagulation defect.  Patient is taking eliquisThis patient is unable to cut nails himself since the patient cannot reach his nails.These nails are painful walking and wearing shoes.  This patient presents for at risk foot care today.  General Appearance  Alert, conversant and in no acute stress.  Vascular  Dorsalis pedis and posterior tibial  pulses are palpable  bilaterally.  Capillary return is within normal limits  bilaterally. Temperature is within normal limits  bilaterally.  Neurologic  Senn-Weinstein monofilament wire test within normal limits  bilaterally. Muscle power within normal limits bilaterally.  Nails Thick disfigured discolored nails with subungual debris  from hallux to fifth toes bilaterally. No evidence of bacterial infection or drainage bilaterally.  Orthopedic  No limitations of motion  feet .  No crepitus or effusions noted.  No bony pathology or digital deformities noted.  Skin  normotropic skin with no porokeratosis noted bilaterally.  No signs of infections or ulcers noted.     Onychomycosis  Pain in right toes  Pain in left toes  Consent was obtained for treatment procedures.   Mechanical debridement of nails 1-5  bilaterally performed with a nail nipper.  Filed with dremel without incident.    Return office visit   3 months                  Told patient to return for periodic foot care and evaluation due to potential at risk complications.   Helane Gunther DPM

## 2023-07-23 ENCOUNTER — Ambulatory Visit
Admission: RE | Admit: 2023-07-23 | Discharge: 2023-07-23 | Disposition: A | Discharge status: Home / Self Care | Payer: Medicare Other | Source: Ambulatory Visit | Attending: Nephrology | Admitting: Nephrology

## 2023-07-23 DIAGNOSIS — N189 Chronic kidney disease, unspecified: Secondary | ICD-10-CM | POA: Diagnosis not present

## 2023-07-23 DIAGNOSIS — N1832 Chronic kidney disease, stage 3b: Secondary | ICD-10-CM

## 2023-07-23 DIAGNOSIS — N400 Enlarged prostate without lower urinary tract symptoms: Secondary | ICD-10-CM | POA: Diagnosis not present

## 2023-07-23 DIAGNOSIS — D631 Anemia in chronic kidney disease: Secondary | ICD-10-CM

## 2023-07-26 ENCOUNTER — Ambulatory Visit (HOSPITAL_BASED_OUTPATIENT_CLINIC_OR_DEPARTMENT_OTHER): Payer: Medicare Other | Admitting: Cardiovascular Disease

## 2023-07-26 ENCOUNTER — Encounter (HOSPITAL_BASED_OUTPATIENT_CLINIC_OR_DEPARTMENT_OTHER): Payer: Self-pay | Admitting: Cardiovascular Disease

## 2023-07-26 VITALS — BP 99/62 | HR 70 | Ht 69.0 in | Wt 192.0 lb

## 2023-07-26 DIAGNOSIS — I10 Essential (primary) hypertension: Secondary | ICD-10-CM | POA: Diagnosis not present

## 2023-07-26 DIAGNOSIS — Z5181 Encounter for therapeutic drug level monitoring: Secondary | ICD-10-CM

## 2023-07-26 DIAGNOSIS — E7849 Other hyperlipidemia: Secondary | ICD-10-CM | POA: Diagnosis not present

## 2023-07-26 DIAGNOSIS — G629 Polyneuropathy, unspecified: Secondary | ICD-10-CM | POA: Diagnosis not present

## 2023-07-26 DIAGNOSIS — I484 Atypical atrial flutter: Secondary | ICD-10-CM | POA: Diagnosis not present

## 2023-07-26 DIAGNOSIS — N184 Chronic kidney disease, stage 4 (severe): Secondary | ICD-10-CM | POA: Diagnosis not present

## 2023-07-26 DIAGNOSIS — I428 Other cardiomyopathies: Secondary | ICD-10-CM

## 2023-07-26 DIAGNOSIS — I5042 Chronic combined systolic (congestive) and diastolic (congestive) heart failure: Secondary | ICD-10-CM

## 2023-07-26 NOTE — Patient Instructions (Addendum)
Medication Instructions:  STOP BIDIL   *If you need a refill on your cardiac medications before your next appointment, please call your pharmacy*  Lab Work: FASTING LP/CMET SOON   If you have labs (blood work) drawn today and your tests are completely normal, you will receive your results only by: MyChart Message (if you have MyChart) OR A paper copy in the mail If you have any lab test that is abnormal or we need to change your treatment, we will call you to review the results.  Testing/Procedures: NONE  Follow-Up: At [facility], you and your health needs are our priority.  As part of our continuing mission to provide you with exceptional heart care, we have created designated Provider Care Teams.  These Care Teams include your primary Cardiologist (physician) and Advanced Practice Providers (APPs -  Physician Assistants and Nurse Practitioners) who all work together to provide you with the care you need, when you need it.  We recommend signing up for the patient portal called "MyChart".  Sign up information is provided on this After Visit Summary.  MyChart is used to connect with patients for Virtual Visits (Telemedicine).  Patients are able to view lab/test results, encounter notes, upcoming appointments, etc.  Non-urgent messages can be sent to your provider as well.   To learn more about what you can do with MyChart, go to [email or web address].    Your next appointment:   6 month(s)  Provider:   Chilton Si, MD or Gillian Shields, NP    You have been referred to [facility]  Where: [facility] Address: [address] [place] [address] Phone: [phone]  IF YOU DO NOT HEAR FROM THEM IN 2 WEEKS YOU CAN CALL THEM DIRECTLY AT NUMBER LISTED

## 2023-07-30 ENCOUNTER — Encounter (HOSPITAL_BASED_OUTPATIENT_CLINIC_OR_DEPARTMENT_OTHER): Payer: Self-pay | Admitting: Cardiovascular Disease

## 2023-08-03 ENCOUNTER — Ambulatory Visit: Payer: Medicare Other | Admitting: Hematology & Oncology

## 2023-08-03 ENCOUNTER — Other Ambulatory Visit: Payer: Medicare Other

## 2023-08-07 ENCOUNTER — Ambulatory Visit: Payer: Self-pay | Admitting: Internal Medicine

## 2023-08-07 NOTE — Progress Notes (Deleted)
I,Jameka J Llittleton, CMA,acting as a Neurosurgeon for Eric Aliment, Eric [name].,have documented all relevant documentation on the behalf of Eric Aliment, Eric [name],as directed by  Eric Aliment, Eric [name] while in the presence of Eric Aliment, Eric [name].  Subjective:  Patient ID: Eric Divine., Eric [name] , male    DOB: [birth year] , 72 y.o.   MRN: [id number]  No chief complaint on file.   HPI  The patient is here for a follow-up on his diabetes, blood pressure & chol. He reports compliance with BP/heart failure meds. Denies headaches, cp and SOB.      Diabetes He presents for his follow-up diabetic visit. He has type 2 diabetes mellitus. There are no hypoglycemic associated symptoms. Pertinent negatives for diabetes include no blurred vision and no chest pain. There are no hypoglycemic complications. There are no diabetic complications. Pertinent negatives for diabetic complications include no CVA. Risk factors for coronary artery disease include obesity and sedentary lifestyle. Current diabetic treatment includes oral agent (dual therapy). He is following a diabetic diet. Meal planning includes avoidance of concentrated sweets. He participates in exercise intermittently. His breakfast blood glucose is taken between 8-9 am. His breakfast blood glucose range is generally 110-130 mg/dl. An ACE inhibitor/angiotensin II receptor blocker is not being taken. He sees a podiatrist.Eye exam is not current.  Hypertension This is a chronic problem. The current episode started more than 1 year ago. The problem has been gradually improving since onset. The problem is controlled. Pertinent negatives include no blurred vision, chest pain, palpitations or shortness of breath. Risk factors for coronary artery disease include diabetes mellitus, dyslipidemia, sedentary lifestyle, obesity, male gender and stress. Past treatments include beta blockers and diuretics. The current treatment provides moderate improvement. Hypertensive end-organ damage  includes kidney disease. There is no history of CVA.  Hyperlipidemia This is a chronic problem. The current episode started more than 1 year ago. Exacerbating diseases include diabetes and hypothyroidism. He has no history of obesity. Pertinent negatives include no chest pain or shortness of breath. Current antihyperlipidemic treatment includes statins.     Past Medical History:  Diagnosis Date   Acute blood loss anemia    Acute encephalopathy    Acute hypoxemic respiratory failure (HCC)    Acute idiopathic gout of right ankle    Acute lumbar back pain    Acute on chronic systolic heart failure (HCC)    Acute respiratory failure (HCC)    AKI (acute kidney injury) (HCC)    Altered mental status    Aortic aneurysm without rupture (HCC) 02/09/2016   Aortic root enlargement (HCC) 02/13/2012   Aortic valve regurgitation, acquired 02/13/2012   Arrhythmia    Atrial flutter (HCC) 03/18/2012   Back pain    Cardiac arrest (HCC) 02/01/2016   CHF (congestive heart failure) (HCC)    Chronic anticoagulation 03/04/2013   Chronic combined systolic and diastolic heart failure (HCC)    Chronic kidney disease    kidney fx studies increased    Chronic lower back pain    Chronic renal insufficiency    Chronic systolic heart failure (HCC) 02/13/2012   Recent diagnosis 4 / 2013, LVEF 25% by Echo 12/2011  03/2013: Echo at [facility] Conclusions: 1. Left ventricular ejection fraction estimated by 2D at 40-45 percent. 2. Mild concentric left ventricular hypertrophy. 3. Mild left atrial enlargement. 4. Moderate aortic valve regurgitation. 5. The aortic root at the sinus(es) of valsalva is moderately dilated 6. Mild mitral valve regurgitation. 7.   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    Goals of care, counseling/discussion 05/05/2020   HCAP (healthcare-associated pneumonia)    Heart murmur    Hyperlipidemia 02/13/2012   Hypertension    Hypothyroidism    Internal hemorrhoids without mention of complication 04/11/2013   Labile blood pressure    Left bundle branch block 02/13/2012   Leg weakness, bilateral    Long term (current) use of anticoagulants 08/06/2013   Eliquis therapy    Long term current use of amiodarone 08/07/2016   Lower extremity weakness    Migraine 02/13/2012   "opthalmic"   Multiple myeloma (HCC) 05/05/2020   Non-traumatic rhabdomyolysis    Obesity (BMI 30-39.9) 02/13/2012   Pain    Paroxysmal atrial fibrillation (HCC)    Pneumonia    Retroperitoneal bleed    Right ankle pain    Right knee pain    Severe aortic regurgitation 02/09/2016   Special screening for malignant neoplasms, colon 04/11/2013   Thyroid activity decreased    Tibial pain    Varicose vein of leg    right   Ventricular fibrillation (HCC) 02/09/2016   Weakness of both lower extremities      Family History  Problem Relation Age of Onset   Hypertension Mother    Heart disease Mother    Heart failure Mother    Diabetes Mother    Hypertension Father    Heart disease Father    Heart failure Father      Current Outpatient Medications:    acetaminophen (TYLENOL) 500 MG tablet, Take 1,000 mg by mouth every 4 (four) hours as needed for moderate pain or headache., Disp: , Rfl:    allopurinol (ZYLOPRIM) 100 MG tablet, TAKE 1 TABLET BY MOUTH MONDAY-FRIDAY. (Patient taking differently: Take 100 mg by mouth daily.), Disp: 90 tablet, Rfl: 1   amiodarone (PACERONE) 100 MG tablet, Take 1 tablet (100 mg total) by mouth daily., Disp: 90 tablet, Rfl: 3   apixaban (ELIQUIS) 5 MG TABS tablet, Take 1  tablet (5 mg total) by mouth 2 (two) times daily., Disp: 60 tablet, Rfl: 5   carvedilol (COREG) 6.25 MG tablet, Take 1 tablet by mouth twice a day, Disp: 180 tablet, Rfl: 1   Cholecalciferol (VITAMIN D) 125 MCG (5000 UT) CAPS, Take by mouth daily at 6 (six) AM., Disp: , Rfl:    dapagliflozin propanediol (FARXIGA) 10 MG TABS tablet, Take 1 tablet (10 mg total) by mouth daily before breakfast., Disp: 30 tablet, Rfl: 5   famciclovir (FAMVIR) 500 MG tablet, Take 1 tablet (500 mg total) by mouth daily., Disp: 30 tablet, Rfl: 3   ferrous sulfate 324 (65 Fe) MG TBEC, Take 1 tablet by mouth daily., Disp: , Rfl:    fluticasone (FLONASE) 50 MCG/ACT nasal spray, Place 2 sprays into both nostrils daily., Disp: 16 g, Rfl: 2   furosemide (LASIX) 80 MG tablet, Alternate one tablet (80mg ) and half tablet (40mg ) every other day. Okay to take whole tablet as needed for fluid retention, edema., Disp: 90 tablet, Rfl: 3  Hypromellose (ARTIFICIAL TEARS OP), Place 1 drop into both eyes daily as needed (dry eyes)., Disp: , Rfl:    levothyroxine (SYNTHROID) 75 MCG tablet, Take 1 tablet (75 mcg total) by mouth daily before breakfast., Disp: 90 tablet, Rfl: 1   loperamide (IMODIUM) 2 MG capsule, Take 2 mg by mouth as needed for diarrhea or loose stools., Disp: , Rfl:    potassium chloride 20 MEQ/15ML (10%) SOLN, TAKE 15 ML BY MOUTH TWICE DAILY (Patient taking differently: daily.), Disp: 946 mL, Rfl: 1   rosuvastatin (CRESTOR) 20 MG tablet, TAKE 1 TABLET(20 MG) BY MOUTH DAILY, Disp: 90 tablet, Rfl: 2   Semaglutide (OZEMPIC, 0.25 OR 0.5 MG/DOSE, [place]), Inject 0.25 mg into the skin once a week., Disp: , Rfl:    Semaglutide,0.25 or 0.5MG /DOS, 2 MG/3ML SOPN, Inject 0.5 into the skin once weekly., Disp: 3 mL, Rfl: 1   vitamin B-12 (CYANOCOBALAMIN) 100 MCG tablet, Take 100 mcg by mouth daily., Disp: , Rfl:    vitamin C (ASCORBIC ACID) 500 MG tablet, Take 500 mg by mouth daily., Disp: , Rfl:    VITAMIN D PO, Take 1,000 Units by mouth  daily., Disp: , Rfl:    Allergies  Allergen Reactions   Lactose Intolerance (Gi) Diarrhea   Lactose Diarrhea     Review of Systems  Eyes:  Negative for blurred vision.  Respiratory:  Negative for shortness of breath.   Cardiovascular:  Negative for chest pain and palpitations.     There were no vitals filed for this visit. There is no height or weight on file to calculate BMI.  Wt Readings from Last 3 Encounters:  07/26/23 192 lb (87.1 kg)  06/29/23 191 lb (86.6 kg)  06/01/23 193 lb (87.5 kg)    The 10-year ASCVD risk score (Arnett DK, et al., 2019) is: 18.9%   Values used to calculate the score:     Age: 17 years     Sex: Male     Is Non-Hispanic African American: Yes     Diabetic: Yes     Tobacco smoker: No     Systolic Blood Pressure: 99 mmHg     Is BP treated: Yes     HDL Cholesterol: 87 mg/dL     Total Cholesterol: 186 mg/dL  Objective:  Physical Exam      Assessment And Plan:  There are no diagnoses linked to this encounter.  No follow-ups on file.  Patient was given opportunity to ask questions. Patient verbalized understanding of the plan and was able to repeat key elements of the plan. All questions were answered to their satisfaction.    I, Eric Aliment, Eric [name], have reviewed all documentation for this visit. The documentation on 08/07/23 for the exam, diagnosis, procedures, and orders are all accurate and complete.   IF YOU HAVE BEEN REFERRED TO A SPECIALIST, IT MAY TAKE 1-2 WEEKS TO SCHEDULE/PROCESS THE REFERRAL. IF YOU HAVE NOT HEARD FROM US/SPECIALIST IN TWO WEEKS, PLEASE GIVE [place] A CALL AT [phone].

## 2023-08-10 ENCOUNTER — Inpatient Hospital Stay (HOSPITAL_BASED_OUTPATIENT_CLINIC_OR_DEPARTMENT_OTHER): Payer: Medicare Other | Admitting: Hematology & Oncology

## 2023-08-10 ENCOUNTER — Inpatient Hospital Stay: Payer: Medicare Other | Attending: Hematology & Oncology

## 2023-08-10 ENCOUNTER — Other Ambulatory Visit: Payer: Self-pay | Admitting: Cardiovascular Disease

## 2023-08-10 ENCOUNTER — Encounter: Payer: Self-pay | Admitting: Hematology & Oncology

## 2023-08-10 VITALS — BP 124/76 | HR 85 | Temp 97.9°F | Resp 18 | Ht 68.0 in | Wt 195.1 lb

## 2023-08-10 DIAGNOSIS — Z7901 Long term (current) use of anticoagulants: Secondary | ICD-10-CM | POA: Insufficient documentation

## 2023-08-10 DIAGNOSIS — Z79899 Other long term (current) drug therapy: Secondary | ICD-10-CM | POA: Diagnosis not present

## 2023-08-10 DIAGNOSIS — M7989 Other specified soft tissue disorders: Secondary | ICD-10-CM | POA: Diagnosis not present

## 2023-08-10 DIAGNOSIS — C9000 Multiple myeloma not having achieved remission: Secondary | ICD-10-CM | POA: Insufficient documentation

## 2023-08-10 LAB — CBC WITH DIFFERENTIAL (CANCER CENTER ONLY)
Abs Immature Granulocytes: 0.02 10*3/uL (ref 0.00–0.07)
Basophils Absolute: 0 10*3/uL (ref 0.0–0.1)
Basophils Relative: 0 %
Eosinophils Absolute: 0.3 10*3/uL (ref 0.0–0.5)
Eosinophils Relative: 5 %
HCT: 41.4 % (ref 39.0–52.0)
Hemoglobin: 13.7 g/dL (ref 13.0–17.0)
Immature Granulocytes: 0 %
Lymphocytes Relative: 13 %
Lymphs Abs: 0.8 10*3/uL (ref 0.7–4.0)
MCH: 30.9 pg (ref 26.0–34.0)
MCHC: 33.1 g/dL (ref 30.0–36.0)
MCV: 93.5 fL (ref 80.0–100.0)
Monocytes Absolute: 0.8 10*3/uL (ref 0.1–1.0)
Monocytes Relative: 14 %
Neutro Abs: 3.8 10*3/uL (ref 1.7–7.7)
Neutrophils Relative %: 68 %
Platelet Count: 142 10*3/uL — ABNORMAL LOW (ref 150–400)
RBC: 4.43 MIL/uL (ref 4.22–5.81)
RDW: 16 % — ABNORMAL HIGH (ref 11.5–15.5)
WBC Count: 5.6 10*3/uL (ref 4.0–10.5)
nRBC: 0 % (ref 0.0–0.2)

## 2023-08-10 LAB — CMP (CANCER CENTER ONLY)
ALT: 18 U/L (ref 0–44)
AST: 23 U/L (ref 15–41)
Albumin: 3.9 g/dL (ref 3.5–5.0)
Alkaline Phosphatase: 77 U/L (ref 38–126)
Anion gap: 8 (ref 5–15)
BUN: 37 mg/dL — ABNORMAL HIGH (ref 8–23)
CO2: 27 mmol/L (ref 22–32)
Calcium: 9.2 mg/dL (ref 8.9–10.3)
Chloride: 108 mmol/L (ref 98–111)
Creatinine: 2.04 mg/dL — ABNORMAL HIGH (ref 0.61–1.24)
GFR, Estimated: 34 mL/min — ABNORMAL LOW (ref >60)
Glucose, Bld: 112 mg/dL — ABNORMAL HIGH (ref 70–99)
Potassium: 3.8 mmol/L (ref 3.5–5.1)
Sodium: 143 mmol/L (ref 135–145)
Total Bilirubin: 1.3 mg/dL — ABNORMAL HIGH (ref <1.2)
Total Protein: 6 g/dL — ABNORMAL LOW (ref 6.5–8.1)

## 2023-08-10 LAB — LACTATE DEHYDROGENASE: LDH: 227 U/L — ABNORMAL HIGH (ref 98–192)

## 2023-08-10 NOTE — Progress Notes (Signed)
Hematology and Oncology Follow Up Visit  Kershaw Sphar., MD [id number] [birth year] 72 y.o. 08/10/2023   Principle Diagnosis:  IgG kappa myeloma  - 1q+   Current Therapy:        CyBorD -- s/p cycle  #19 - started on 06/04/2020 --last treatment in 08/2022 --on hold for now.   Interim History:  [name] is here today for follow-up.  We last saw him back on 06/29/2023.  Since then, he has been doing okay.  He is still working part-time as an Clinical research associate.  He is helping to take care of of his poor wife.  When we last saw him, his Kappa light chain was 2.3.  His IgG level was 664 mg/dL.  His monoclonal spike was not found.  He has had no problems with nausea or vomiting.  He has had no change in bowel or bladder habits.  He does have chronic renal insufficiency.  He has had no leg rashes.  He has had no leg swelling.  He has had no bleeding.  He has had no headache.  Overall, I would say that his performance status is ECOG 1.       Medications:  Allergies as of 08/10/2023       Reactions   Lactose Intolerance (gi) Diarrhea   Lactose Diarrhea        Medication List        Accurate as of August 10, 2023 10:23 AM. If you have any questions, ask your nurse or doctor.          acetaminophen 500 MG tablet Commonly known as: TYLENOL Take 1,000 mg by mouth every 4 (four) hours as needed for moderate pain or headache.   allopurinol 100 MG tablet Commonly known as: Zyloprim TAKE 1 TABLET BY MOUTH MONDAY-FRIDAY. What changed:  how much to take how to take this when to take this additional instructions   amiodarone 100 MG tablet Commonly known as: PACERONE Take 1 tablet (100 mg total) by mouth daily.   apixaban 5 MG Tabs tablet Commonly known as: Eliquis Take 1 tablet (5 mg total) by mouth 2 (two) times daily.   ARTIFICIAL TEARS OP Place 1 drop into both eyes daily as needed (dry eyes).   ascorbic acid 500 MG tablet Commonly known as: VITAMIN C Take 500  mg by mouth daily.   carvedilol 6.25 MG tablet Commonly known as: COREG Take 1 tablet by mouth twice a day   dapagliflozin propanediol 10 MG Tabs tablet Commonly known as: Farxiga Take 1 tablet (10 mg total) by mouth daily before breakfast.   famciclovir 500 MG tablet Commonly known as: FAMVIR Take 1 tablet (500 mg total) by mouth daily.   ferrous sulfate 324 (65 Fe) MG Tbec Take 1 tablet by mouth daily.   fluticasone 50 MCG/ACT nasal spray Commonly known as: FLONASE Place 2 sprays into both nostrils daily.   furosemide 80 MG tablet Commonly known as: LASIX Alternate one tablet (80mg ) and half tablet (40mg ) every other day. Okay to take whole tablet as needed for fluid retention, edema.   levothyroxine 75 MCG tablet Commonly known as: SYNTHROID Take 1 tablet (75 mcg total) by mouth daily before breakfast.   loperamide 2 MG capsule Commonly known as: IMODIUM Take 2 mg by mouth as needed for diarrhea or loose stools.   OZEMPIC (0.25 OR 0.5 MG/DOSE) [place] Inject 0.25 mg into the skin once a week.   Semaglutide(0.25 or 0.5MG /DOS) 2 MG/3ML Sopn Inject 0.5 into the  skin once weekly.   potassium chloride 20 MEQ/15ML (10%) Soln TAKE 15 ML BY MOUTH TWICE DAILY What changed: See the new instructions.   rosuvastatin 20 MG tablet Commonly known as: CRESTOR TAKE 1 TABLET(20 MG) BY MOUTH DAILY   vitamin B-12 100 MCG tablet Commonly known as: CYANOCOBALAMIN Take 100 mcg by mouth daily.   Vitamin D 125 MCG (5000 UT) Caps Take by mouth daily at 6 (six) AM.   cholecalciferol 25 MCG (1000 UNIT) tablet Commonly known as: VITAMIN D3 Take 5,000 Units by mouth daily.        Allergies:  Allergies  Allergen Reactions   Lactose Intolerance (Gi) Diarrhea   Lactose Diarrhea    Past Medical History, Surgical history, Social history, and Family History were reviewed and updated.  Review of Systems: Review of Systems  Constitutional: Negative.   Eyes: Negative.   Respiratory:  Negative.    Cardiovascular:  Positive for leg swelling.  Gastrointestinal: Negative.   Genitourinary: Negative.   Musculoskeletal: Negative.   Skin: Negative.   Neurological: Negative.   Endo/Heme/Allergies: Negative.   Psychiatric/Behavioral: Negative.       Physical Exam:  height is 5\' 8"  (1.727 m) and weight is 195 lb 1.3 oz (88.5 kg). His oral temperature is 97.9 F (36.6 C). His blood pressure is 124/76 and his pulse is 85. His respiration is 18 and oxygen saturation is 98%.   Wt Readings from Last 3 Encounters:  08/10/23 195 lb 1.3 oz (88.5 kg)  07/26/23 192 lb (87.1 kg)  06/29/23 191 lb (86.6 kg)    Physical Exam Vitals reviewed.  HENT:     Head: Normocephalic and atraumatic.  Eyes:     Pupils: Pupils are equal, round, and reactive to light.  Cardiovascular:     Rate and Rhythm: Normal rate and regular rhythm.     Heart sounds: Normal heart sounds.  Pulmonary:     Effort: Pulmonary effort is normal.     Breath sounds: Normal breath sounds.  Abdominal:     General: Bowel sounds are normal.     Palpations: Abdomen is soft.  Musculoskeletal:        General: No tenderness or deformity. Normal range of motion.     Cervical back: Normal range of motion.  Lymphadenopathy:     Cervical: No cervical adenopathy.  Skin:    General: Skin is warm and dry.     Findings: No erythema or rash.  Neurological:     Mental Status: He is alert and oriented to person, place, and time.  Psychiatric:        Behavior: Behavior normal.        Thought Content: Thought content normal.        Judgment: Judgment normal.     Lab Results  Component Value Date   WBC 5.6 08/10/2023   HGB 13.7 08/10/2023   HCT 41.4 08/10/2023   MCV 93.5 08/10/2023   PLT 142 (L) 08/10/2023   Lab Results  Component Value Date   FERRITIN 56 09/15/2022   IRON 92 09/15/2022   TIBC 349 09/15/2022   UIBC 257 09/15/2022   IRONPCTSAT 26 09/15/2022   Lab Results  Component Value Date   RETICCTPCT  1.4 09/15/2022   RBC 4.43 08/10/2023   Lab Results  Component Value Date   KPAFRELGTCHN 23.1 (H) 06/29/2023   LAMBDASER 25.4 06/29/2023   KAPLAMBRATIO 0.91 06/29/2023   Lab Results  Component Value Date   IGGSERUM 664 06/29/2023   IGA  173 06/29/2023   IGMSERUM 25 06/29/2023   Lab Results  Component Value Date   TOTALPROTELP 5.3 (L) 06/29/2023   ALBUMINELP 3.1 06/29/2023   A1GS 0.3 06/29/2023   A2GS 0.5 06/29/2023   BETS 0.9 06/29/2023   GAMS 0.5 06/29/2023   MSPIKE Not Observed 06/29/2023   SPEI Comment 04/22/2021     Chemistry      Component Value Date/Time   NA 143 08/10/2023 0911   NA 144 08/25/2022 1050   K 3.8 08/10/2023 0911   CL 108 08/10/2023 0911   CO2 27 08/10/2023 0911   BUN 37 (H) 08/10/2023 0911   BUN 37 (H) 08/25/2022 1050   CREATININE 2.04 (H) 08/10/2023 0911   CREATININE 1.95 (H) 09/08/2016 0859      Component Value Date/Time   CALCIUM 9.2 08/10/2023 0911   ALKPHOS 77 08/10/2023 0911   AST 23 08/10/2023 0911   ALT 18 08/10/2023 0911   BILITOT 1.3 (H) 08/10/2023 0911       Impression and Plan: 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  He has done incredibly well with treatment.  Again, he has been off treatment now for about 11 months.  His monoclonal studies have all looked pretty stable.  We will continue to hold his treatments.  If we start to see changes with his myeloma studies, then we can certainly initiate therapy.  I know his priority is taking care of his wife.  I want to make sure that he is able to do this without any hindrance from [place].  We will now get him back in about 2 months.  He will be nice to get him back after the Holiday season.  I am happy that he is doing so well.  His quality of life is doing nicely.  Again he is able to take care of his wife.   Josph Macho, MD 11/15/202410:23 AM

## 2023-08-10 NOTE — Therapy (Signed)
OUTPATIENT PHYSICAL THERAPY NEURO EVALUATION   Patient Name: Eric Pivonka., MD MRN: [id number] DOB:[birth year], 72 y.o., male Today's Date: 08/13/2023   PCP:    Dorothyann Peng, MD   REFERRING PROVIDER: Chilton Si, MD   END OF SESSION:  PT End of Session - 08/13/23 1232     Visit Number 1    Number of Visits 13    Date for PT Re-Evaluation 09/24/23    Authorization Type Medicare/AARP    PT Start Time 1155    PT Stop Time 1231    PT Time Calculation (min) 36 min    Equipment Utilized During Treatment Gait belt    Activity Tolerance Patient tolerated treatment well    Behavior During Therapy WFL for tasks assessed/performed             Past Medical History:  Diagnosis Date   Acute blood loss anemia    Acute encephalopathy    Acute hypoxemic respiratory failure (HCC)    Acute idiopathic gout of right ankle    Acute lumbar back pain    Acute on chronic systolic heart failure (HCC)    Acute respiratory failure (HCC)    AKI (acute kidney injury) (HCC)    Altered mental status    Aortic aneurysm without rupture (HCC) 02/09/2016   Aortic root enlargement (HCC) 02/13/2012   Aortic valve regurgitation, acquired 02/13/2012   Arrhythmia    Atrial flutter (HCC) 03/18/2012   Back pain    Cardiac arrest (HCC) 02/01/2016   CHF (congestive heart failure) (HCC)    Chronic anticoagulation 03/04/2013   Chronic combined systolic and diastolic heart failure (HCC)    Chronic kidney disease    kidney fx studies increased    Chronic lower back pain    Chronic renal insufficiency    Chronic systolic heart failure (HCC) 02/13/2012   Recent diagnosis 4 / 2013, LVEF 25% by Echo 12/2011  03/2013: Echo at [facility] Conclusions: 1. Left ventricular ejection fraction estimated by 2D at 40-45 percent. 2. Mild concentric left ventricular hypertrophy. 3. Mild left atrial enlargement. 4. Moderate aortic valve regurgitation. 5. The aortic root at the sinus(es) of valsalva is  moderately dilated 6. Mild mitral valve regurgitation. 7.   CKD (chronic kidney disease)    CKD (chronic kidney disease), stage IV (HCC) 08/06/2013   Creatinine 2.4 on 07/04/13    Claustrophobia    Colon cancer screening 03/04/2013   Debility 02/22/2016   Diabetes mellitus type 2 in nonobese [facility])    Diabetes mellitus type 2 in obese    Dysrhythmia    "palpitations"   Encounter for central line placement    Exertional dyspnea 01/2012   Femoral nerve injury 02/22/2016   Femoral neuropathy    Goals of care, counseling/discussion 05/05/2020   HCAP (healthcare-associated pneumonia)    Heart murmur    Hyperlipidemia 02/13/2012   Hypertension    Hypothyroidism    Internal hemorrhoids without mention of complication 04/11/2013   Labile blood pressure    Left bundle branch block 02/13/2012   Leg weakness, bilateral    Long term (current) use of anticoagulants 08/06/2013   Eliquis therapy    Long term current use of amiodarone 08/07/2016   Lower extremity weakness    Migraine 02/13/2012   "opthalmic"   Multiple myeloma (HCC) 05/05/2020   Non-traumatic rhabdomyolysis    Obesity (BMI 30-39.9) 02/13/2012   Pain    Paroxysmal atrial fibrillation (HCC)    Pneumonia    Retroperitoneal bleed  Right ankle pain    Right knee pain    Severe aortic regurgitation 02/09/2016   Special screening for malignant neoplasms, colon 04/11/2013   Thyroid activity decreased    Tibial pain    Varicose vein of leg    right   Ventricular fibrillation (HCC) 02/09/2016   Weakness of both lower extremities    Past Surgical History:  Procedure Laterality Date   AORTIC VALVE REPLACEMENT  11/20/2018   CARDIAC CATHETERIZATION N/A 02/17/2016   Procedure: Left Heart Cath and Coronary Angiography;  Surgeon: Corky Crafts, MD;  Location: [facility] INVASIVE CV LAB;  Service: Cardiovascular;  Laterality: N/A;   CARDIOVERSION  03/22/2012   Procedure: CARDIOVERSION;  Surgeon: Donato Schultz, MD;  Location: [facility]  ENDOSCOPY;  Service: Cardiovascular;  Laterality: N/A;   CARDIOVERSION  04/19/2012   Procedure: CARDIOVERSION;  Surgeon: Lesleigh Noe, MD;  Location: [facility] OR;  Service: Cardiovascular;  Laterality: N/A;   COLONOSCOPY N/A 04/11/2013   Procedure: COLONOSCOPY;  Surgeon: Louis Meckel, MD;  Location: [facility] ENDOSCOPY;  Service: Endoscopy;  Laterality: N/A;   COLONOSCOPY N/A 04/11/2013   Procedure: COLONOSCOPY;  Surgeon: Louis Meckel, MD;  Location: [facility] ENDOSCOPY;  Service: Endoscopy;  Laterality: N/A;   EP IMPLANTABLE DEVICE N/A 02/17/2016   Procedure: BiV ICD Insertion CRT-D;  Surgeon: Marinus Maw, MD;  Location: [facility] INVASIVE CV LAB;  Service: Cardiovascular;  Laterality: N/A;   FINGER SURGERY  2012   "4th digit right hand; thumb on left hand"   RADIOLOGY WITH ANESTHESIA N/A 02/11/2016   Procedure: MRI OF THE BRAIN WITHOUT CONTRAST, LUMBAR WITHOUT CONTRAST;  Surgeon: Medication Radiologist, MD;  Location: [facility] OR;  Service: Radiology;  Laterality: N/A;  DR. WOOD/MRI   RIGHT/LEFT HEART CATH AND CORONARY ANGIOGRAPHY N/A 07/26/2018   Procedure: RIGHT/LEFT HEART CATH AND CORONARY ANGIOGRAPHY;  Surgeon: Lyn Records, MD;  Location: [facility] INVASIVE CV LAB;  Service: Cardiovascular;  Laterality: N/A;   Skin melanocytoma excision  2012   "above left clavicle"   STERNAL INCISION RECLOSURE  11/2018   STERNAL WIRE REMOVAL  11/2018   STERNAL WOUND DEBRIDEMENT  11/2018   TEE WITHOUT CARDIOVERSION  03/22/2012   Procedure: TRANSESOPHAGEAL ECHOCARDIOGRAM (TEE);  Surgeon: Donato Schultz, MD;  Location: [facility] ENDOSCOPY;  Service: Cardiovascular;  Laterality: N/A;   TEE WITHOUT CARDIOVERSION N/A 02/08/2016   Procedure: TRANSESOPHAGEAL ECHOCARDIOGRAM (TEE);  Surgeon: Lewayne Bunting, MD;  Location: [facility] ENDOSCOPY;  Service: Cardiovascular;  Laterality: N/A;   Patient Active Problem List   Diagnosis Date Noted   Acquired thrombophilia (HCC) 05/14/2023   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   Goals of care,  counseling/discussion 05/05/2020   Multiple myeloma (HCC) 05/05/2020   S/P flap graft 01/07/2019   Coronary artery disease 01/07/2019   Long term (current) use of antibiotics 01/03/2019   Essential hypertension 12/27/2018   Postoperative infection of wound of sternum 12/27/2018   Decreased strength, endurance, and mobility 12/27/2018   Cardiac LV ejection fraction 30-35% 12/27/2018   Decreased activities of daily living (ADL) 12/27/2018   Klebsiella infection 12/27/2018   S/P AVR (aortic valve replacement) 11/23/2018   S/P ascending aortic aneurysm repair 11/23/2018   NICM (nonischemic cardiomyopathy) (HCC) 11/19/2018   AICD (automatic cardioverter/defibrillator) present 11/19/2018   Other symptoms and signs involving the musculoskeletal system 05/08/2018   Long term current use of amiodarone 08/07/2016   Type 2 diabetes mellitus with stage 3b chronic kidney disease, without long-term current use of insulin (HCC)    Femoral neuropathy  Leg weakness, bilateral    Paroxysmal atrial fibrillation (HCC)    Ventricular fibrillation (HCC) 02/09/2016   Cardiac arrest (HCC) 02/01/2016   Thyroid activity decreased    Arrhythmia    CKD (chronic kidney disease), stage IV (HCC) 08/06/2013    Class: Chronic   Long term current use of anticoagulant 03/04/2013   Atrial flutter (HCC) 03/18/2012    Class: Acute   Chronic systolic heart failure (HCC) 02/13/2012    Class: Acute   Hypertension, accelerated 02/13/2012   Hyperlipidemia 02/13/2012   Left bundle branch block 02/13/2012   Obesity (BMI 30-39.9) 02/13/2012    ONSET DATE: "quite some time" reports years   REFERRING DIAG:  G62.9 (ICD-10-CM) - Neuropathy  THERAPY DIAG:  Muscle weakness (generalized)  Unsteadiness on feet  Other abnormalities of gait and mobility  Rationale for Evaluation and Treatment: Rehabilitation  SUBJECTIVE:                                                                                                                                                                                              SUBJECTIVE STATEMENT: Patient reports that R leg has paresthesias and weakness from neuropathy. Has difficulty navigating steps, cannot run. Feels pretty good about balance most of the time. A few occasions where the R knee and thigh feel weak or buckle. Had rehab in 2017 for a similar problem and reports good benefit from that POC. Patient  eports that he bumped his R knee on edge of mat as he was walking into clinic gym- "I don't think I hurt my knee."   Pt accompanied by: self  PERTINENT HISTORY: HFrEF, atrial fibrillation, VF cardiac arrest s/p ICD, CAD, severe aortic regurgitation s/p AVR 10/2018 and aortic root replacement with CABGx1, aortic dilation, hypertension, hypothyroidism, type 2 diabetes, multiple myeloma, CKD IIIb-IV, migraine  PAIN:  Are you having pain? Yes: NPRS scale: did not rate/10 Pain location: R knee Pain description: tight, stiff  Aggravating factors: reports this is chronic; worse in AM Relieving factors: exercises in bed   PRECAUTIONS: Fall and ICD/Pacemaker  RED FLAGS: None   WEIGHT BEARING RESTRICTIONS: No  FALLS: Has patient fallen in last 6 months? No  LIVING ENVIRONMENT: Lives with: lives with their spouse; caregiver for his spouse and requires ability to transfer her Lives in: House/apartment Stairs:  4 steps to enter; 3 story home Has following equipment at home: Single point cane, Shower bench, and raised commode  PLOF: Independent; caregiver for wife; working part time as Ophthalmologist- requires sitting, standing, walking   PATIENT GOALS: improve R LE strength  OBJECTIVE:  Note: Objective measures were completed  at Evaluation unless otherwise noted.  DIAGNOSTIC FINDINGS: none recent  COGNITION: Overall cognitive status: Within functional limits for tasks assessed   SENSATION: Intact in B LEs to light touch   COORDINATION: Alternating  pronation/supination: slightly slowed Alternating toe tap: B WNL Finger to nose: B WNL   POSTURE: rounded shoulders and forward head   *PALPATION: no TTP in B calf; slightly more soft tissue restriction on L  LOWER EXTREMITY ROM:     Active  Right Eval Left Eval  Hip flexion    Hip extension    Hip abduction    Hip adduction    Hip internal rotation    Hip external rotation    Knee flexion    Knee extension    Ankle dorsiflexion 14 *c/o pain in calf  10  Ankle plantarflexion    Ankle inversion    Ankle eversion     (Blank rows = not tested)  LOWER EXTREMITY MMT:    MMT Right Eval Left Eval  Hip flexion 4 4+  Hip extension    Hip abduction 4 4  Hip adduction 4 4  Hip internal rotation    Hip external rotation    Knee flexion 4 4+  Knee extension 4 4+  Ankle dorsiflexion 4+ 4+  Ankle plantarflexion 4+ 4+  Ankle inversion    Ankle eversion    (Blank rows = not tested)   GAIT: Gait pattern: Significant R-ward trunk lean with gait; slightly slowed gait speed  Assistive device utilized: None Level of assistance: Modified independence   FUNCTIONAL TESTS:   [facility] PT Assessment - 08/13/23 0001       Standardized Balance Assessment   Standardized Balance Assessment Dynamic Gait Index      Dynamic Gait Index   Level Surface Mild Impairment    Change in Gait Speed Mild Impairment    Gait with Horizontal Head Turns Mild Impairment    Gait with Vertical Head Turns Mild Impairment    Gait and Pivot Turn Normal    Step Over Obstacle Mild Impairment    Step Around Obstacles Mild Impairment    Steps Mild Impairment    Total Score 17                TODAY'S TREATMENT:                                                                                                                              DATE: 08/13/23    PATIENT EDUCATION: Education details: prognosis, POC, HEP, answered pt's questions on leg press machine, edu on aquatic therapy as pt requests  this Person educated: Patient Education method: Explanation, Demonstration, Tactile cues, Verbal cues, and Handouts Education comprehension: verbalized understanding and returned demonstration  HOME EXERCISE PROGRAM: Access Code: [email or web address] URL: [email or web address] Date: 08/13/2023 Prepared by: [facility] - [facility]  Exercises - Standing Gastroc Stretch at Counter  - 1 x daily - 5  x weekly - 2 sets - 30 sec hold - Seated Hamstring Stretch  - 1 x daily - 5 x weekly - 2 sets - 30 sec hold - Standing Hip Abduction with Counter Support  - 1 x daily - 5 x weekly - 2 sets - 10 reps - Standing Hip Extension with Counter Support  - 1 x daily - 5 x weekly - 2 sets - 10 reps - Mini Squat with Counter Support  - 1 x daily - 5 x weekly - 2 sets - 10 reps  GOALS: Goals reviewed with patient? Yes  SHORT TERM GOALS: Target date: 09/03/2023  Patient to be independent with initial HEP. Baseline: HEP initiated Goal status: INITIAL    LONG TERM GOALS: Target date: +09/24/2023  Patient to be independent with advanced HEP. Baseline: Not yet initiated  Goal status: INITIAL  Patient to demonstrate B LE strength >/=4+/5.  Baseline: See above Goal status: INITIAL  Patient to demonstrate L ankle dorsiflexion AROM to at least 14 deg in order to improve efficiency of gait.   Baseline: 10 deg Goal status: INITIAL  Patient to demonstrate safe stair navigation with alternating reciprocal pattern and no UE support.  Baseline: alternating reciprocal pattern with 1-2 UE support required  Goal status: INITIAL  Patient to score at least 20/24 on DGI in order to decrease risk of falls.  Baseline: 17 Goal status: INITIAL  Patient to demonstrate 25% improvement in R-ward trunk lean with gait.  Baseline: significant R lean Goal status: INITIAL  ASSESSMENT:  CLINICAL IMPRESSION:  Patient is a 72 y/o M presenting to [facility] with c/o R LE weakness and paresthesias  d/t neuropathy for several years. Reports difficulty navigating stairs and notes occasional R knee buckling. 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 Patient today presenting with L>R gastroc-soleus soft tissue restriction, reduced L ankle dorsiflexion AROM, B hip and R knee weakness, gait deviations, and imbalance. Patient was educated on gentle stretching and strengthening HEP and reported understanding. Prior to current episode, patient was independent. Would benefit from skilled PT services 2 x/week for 6 weeks to address aforementioned impairments in order to optimize level of function.    OBJECTIVE IMPAIRMENTS: Abnormal gait, decreased balance, difficulty walking, decreased ROM, decreased strength, impaired flexibility, impaired sensation, postural dysfunction, and pain.   ACTIVITY LIMITATIONS: carrying, lifting, bending, standing, squatting, stairs, transfers, bathing, toileting, dressing, locomotion level, and caring for others  PARTICIPATION LIMITATIONS: meal prep, cleaning, laundry, shopping, community activity, occupation, yard work, and church  PERSONAL FACTORS: Age, Past/current experiences, Time since onset of injury/illness/exacerbation, and 3+ comorbidities: HFrEF, atrial fibrillation, VF cardiac arrest s/p ICD, CAD, severe aortic regurgitation s/p AVR 10/2018 and aortic root replacement with CABGx1, aortic dilation, hypertension, hypothyroidism, type 2 diabetes, multiple myeloma, CKD IIIb-IV, migraine  are also affecting patient's functional outcome.   REHAB POTENTIAL: Good  CLINICAL DECISION MAKING: Evolving/moderate complexity  EVALUATION COMPLEXITY: Moderate  PLAN:  PT FREQUENCY: 2x/week  PT DURATION: 6 weeks  PLANNED INTERVENTIONS: 97164- PT Re-evaluation, 97110-Therapeutic exercises, 97530- Therapeutic activity, 97112- Neuromuscular re-education, 97535- Self Care, 16109- Manual therapy, [phone]- Gait training, [phone]-  Canalith repositioning, [phone]- Aquatic Therapy, Patient/Family education, Balance training, Stair training, Taping, Dry Needling, Joint mobilization, Spinal mobilization, Vestibular training, DME instructions, Cryotherapy, and Moist heat  PLAN FOR NEXT SESSION: review HEP; progress B hip and R knee strength, stairs, balance challenges  AQUATICS: Frequency: 1x/wk Duration: 6 wks Special Instruction: R LE strengthening, balance  Anette Guarneri, PT, DPT 08/13/23 12:43 PM  [facility] at [facility] [address] [place], [address] [place], [place] [address] Phone # [phone] Fax # [phone]

## 2023-08-12 LAB — IGG, IGA, IGM
IgA: 185 mg/dL (ref 61–437)
IgG (Immunoglobin G), Serum: 708 mg/dL (ref 603–1613)
IgM (Immunoglobulin M), Srm: 22 mg/dL (ref 15–143)

## 2023-08-13 ENCOUNTER — Ambulatory Visit: Payer: Medicare Other | Attending: Cardiovascular Disease | Admitting: Physical Therapy

## 2023-08-13 ENCOUNTER — Encounter: Payer: Self-pay | Admitting: Physical Therapy

## 2023-08-13 ENCOUNTER — Other Ambulatory Visit: Payer: Self-pay

## 2023-08-13 DIAGNOSIS — R2689 Other abnormalities of gait and mobility: Secondary | ICD-10-CM | POA: Insufficient documentation

## 2023-08-13 DIAGNOSIS — M6281 Muscle weakness (generalized): Secondary | ICD-10-CM | POA: Insufficient documentation

## 2023-08-13 DIAGNOSIS — R2681 Unsteadiness on feet: Secondary | ICD-10-CM | POA: Diagnosis not present

## 2023-08-13 DIAGNOSIS — G629 Polyneuropathy, unspecified: Secondary | ICD-10-CM | POA: Insufficient documentation

## 2023-08-13 LAB — PROTEIN ELECTROPHORESIS, SERUM, WITH REFLEX
A/G Ratio: 1.5 (ref 0.7–1.7)
Albumin ELP: 3.3 g/dL (ref 2.9–4.4)
Alpha-1-Globulin: 0.2 g/dL (ref 0.0–0.4)
Alpha-2-Globulin: 0.5 g/dL (ref 0.4–1.0)
Beta Globulin: 0.9 g/dL (ref 0.7–1.3)
Gamma Globulin: 0.6 g/dL (ref 0.4–1.8)
Globulin, Total: 2.2 g/dL (ref 2.2–3.9)
Total Protein ELP: 5.5 g/dL — ABNORMAL LOW (ref 6.0–8.5)

## 2023-08-13 LAB — KAPPA/LAMBDA LIGHT CHAINS
Kappa free light chain: 28 mg/L — ABNORMAL HIGH (ref 3.3–19.4)
Kappa, lambda light chain ratio: 0.99 (ref 0.26–1.65)
Lambda free light chains: 28.2 mg/L — ABNORMAL HIGH (ref 5.7–26.3)

## 2023-08-13 NOTE — Patient Instructions (Signed)
Aquatic Therapy: What to Expect!  Where:  [facility] at [address] [place], [place] [address] [phone]           How to Prepare:  If you require assistance with dressing, with transportation (ie: wheel chair), or toileting, a caregiver must attend the entire session with you (unless your primary therapists feels this is not necessary).   If there is thunder during your appointment, you will be asked to leave pool area. You have the option to finish your session in the physical therapy area near the gym. Masks in the pool area are optional. Your face will remain dry during your session, so you are welcome to keep your mask on, if desired. You will be spaced at least 6 feet from other aquatic patients.  Please bring your own swim towel to dry off with.   There are Men's and Women's locker rooms with showers, as well as gender neutral bathrooms in the pool area.  Please arrive IN YOUR SUIT and a few minutes prior to your appointment - this helps to avoid delays in starting your session. Head to the pool and await your appointment on the bench on the pool deck. Please make sure to attend to any toileting needs prior to entering the pool. Once on the pool deck your therapist will ask you to sign the Patient  Consent and Assignment of Benefits form. Your therapist may take your blood pressure prior to, during and after your session if indicated. We usually try and create a home exercise program based on activities we do in the pool. Some patients do not want to or do not have the ability to participate in an aquatic home program - this is not a barrier in any way to you participating in aquatic therapy as part of your current therapy plan!  Appointments:  All sessions are 45 minutes  About the pool: Entering the pool: Your therapist will assist you if needed; there are two ways to enter the pool - stairs or a mechanical lift. Your therapist will determine  the most appropriate way for you. Water temperature is usually around 91-95.  There is a lap pool with a temperature around 84 There may be other therapists and patients in the pool at the same time.   Contact Info:            To cancel appointment, please call [facility] at [phone] If you are running late, please call [facility] at [phone]                   

## 2023-08-19 ENCOUNTER — Other Ambulatory Visit: Payer: Self-pay | Admitting: Internal Medicine

## 2023-08-19 DIAGNOSIS — I480 Paroxysmal atrial fibrillation: Secondary | ICD-10-CM

## 2023-08-20 ENCOUNTER — Ambulatory Visit: Payer: Medicare Other | Admitting: Physical Therapy

## 2023-08-20 ENCOUNTER — Encounter: Payer: Self-pay | Admitting: Physical Therapy

## 2023-08-20 DIAGNOSIS — M6281 Muscle weakness (generalized): Secondary | ICD-10-CM | POA: Diagnosis not present

## 2023-08-20 DIAGNOSIS — R2689 Other abnormalities of gait and mobility: Secondary | ICD-10-CM | POA: Diagnosis not present

## 2023-08-20 DIAGNOSIS — R2681 Unsteadiness on feet: Secondary | ICD-10-CM

## 2023-08-20 DIAGNOSIS — G629 Polyneuropathy, unspecified: Secondary | ICD-10-CM | POA: Diagnosis not present

## 2023-08-20 NOTE — Therapy (Signed)
OUTPATIENT PHYSICAL THERAPY/AQUATIC THERAPY TREATMENT NOTE   Patient Name: Eric [name]., MD MRN: [id number] DOB:1951/03/21, 72 y.o., male Today's Date: 08/20/2023   PCP:    Dorothyann Peng, MD   REFERRING PROVIDER: Chilton Si, MD   END OF SESSION:  PT End of Session - 08/20/23 2025     Visit Number 2    Number of Visits 13    Date for PT Re-Evaluation 09/24/23    Authorization Type Medicare/[employer]    PT Start Time 1545   pt arrived 54" late   PT Stop Time 1625    PT Time Calculation (min) 40 min    Equipment Utilized During Treatment Other (comment)   bar bells, aquatic step   Activity Tolerance Patient tolerated treatment well    Behavior During Therapy WFL for tasks assessed/performed             Past Medical History:  Diagnosis Date   Acute blood loss anemia    Acute encephalopathy    Acute hypoxemic respiratory failure (HCC)    Acute idiopathic gout of right ankle    Acute lumbar back pain    Acute on chronic systolic heart failure (HCC)    Acute respiratory failure (HCC)    AKI (acute kidney injury) (HCC)    Altered mental status    Aortic aneurysm without rupture (HCC) 02/09/2016   Aortic root enlargement (HCC) 02/13/2012   Aortic valve regurgitation, acquired 02/13/2012   Arrhythmia    Atrial flutter (HCC) 03/18/2012   Back pain    Cardiac arrest (HCC) 02/01/2016   CHF (congestive heart failure) (HCC)    Chronic anticoagulation 03/04/2013   Chronic combined systolic and diastolic heart failure (HCC)    Chronic kidney disease    kidney fx studies increased    Chronic lower back pain    Chronic renal insufficiency    Chronic systolic heart failure (HCC) 02/13/2012   Recent diagnosis 4 / 2013, LVEF 25% by Echo 12/2011  03/2013: Echo at [facility] Conclusions: 1. Left ventricular ejection fraction estimated by 2D at 40-45 percent. 2. Mild concentric left ventricular hypertrophy. 3. Mild left atrial enlargement. 4. Moderate aortic valve  regurgitation. 5. The aortic root at the sinus(es) of valsalva is moderately dilated 6. Mild mitral valve regurgitation. 7.   CKD (chronic kidney disease)    CKD (chronic kidney disease), stage IV (HCC) 08/06/2013   Creatinine 2.4 on 07/04/13    Claustrophobia    Colon cancer screening 03/04/2013   Debility 02/22/2016   Diabetes mellitus type 2 in nonobese [facility])    Diabetes mellitus type 2 in obese    Dysrhythmia    "palpitations"   Encounter for central line placement    Exertional dyspnea 01/2012   Femoral nerve injury 02/22/2016   Femoral neuropathy    Goals of care, counseling/discussion 05/05/2020   HCAP (healthcare-associated pneumonia)    Heart murmur    Hyperlipidemia 02/13/2012   Hypertension    Hypothyroidism    Internal hemorrhoids without mention of complication 04/11/2013   Labile blood pressure    Left bundle branch block 02/13/2012   Leg weakness, bilateral    Long term (current) use of anticoagulants 08/06/2013   Eliquis therapy    Long term current use of amiodarone 08/07/2016   Lower extremity weakness    Migraine 02/13/2012   "opthalmic"   Multiple myeloma (HCC) 05/05/2020   Non-traumatic rhabdomyolysis    Obesity (BMI 30-39.9) 02/13/2012   Pain    Paroxysmal atrial fibrillation (  HCC)    Pneumonia    Retroperitoneal bleed    Right ankle pain    Right knee pain    Severe aortic regurgitation 02/09/2016   Special screening for malignant neoplasms, colon 04/11/2013   Thyroid activity decreased    Tibial pain    Varicose vein of leg    right   Ventricular fibrillation (HCC) 02/09/2016   Weakness of both lower extremities    Past Surgical History:  Procedure Laterality Date   AORTIC VALVE REPLACEMENT  11/20/2018   CARDIAC CATHETERIZATION N/A 02/17/2016   Procedure: Left Heart Cath and Coronary Angiography;  Surgeon: Corky Crafts, MD;  Location: [facility] INVASIVE CV LAB;  Service: Cardiovascular;  Laterality: N/A;   CARDIOVERSION  03/22/2012    Procedure: CARDIOVERSION;  Surgeon: Donato Schultz, MD;  Location: [facility] ENDOSCOPY;  Service: Cardiovascular;  Laterality: N/A;   CARDIOVERSION  04/19/2012   Procedure: CARDIOVERSION;  Surgeon: Lesleigh Noe, MD;  Location: [facility] OR;  Service: Cardiovascular;  Laterality: N/A;   COLONOSCOPY N/A 04/11/2013   Procedure: COLONOSCOPY;  Surgeon: Louis Meckel, MD;  Location: [facility] ENDOSCOPY;  Service: Endoscopy;  Laterality: N/A;   COLONOSCOPY N/A 04/11/2013   Procedure: COLONOSCOPY;  Surgeon: Louis Meckel, MD;  Location: [facility] ENDOSCOPY;  Service: Endoscopy;  Laterality: N/A;   EP IMPLANTABLE DEVICE N/A 02/17/2016   Procedure: BiV ICD Insertion CRT-D;  Surgeon: Marinus Maw, MD;  Location: [facility] INVASIVE CV LAB;  Service: Cardiovascular;  Laterality: N/A;   FINGER SURGERY  2012   "4th digit right hand; thumb on left hand"   RADIOLOGY WITH ANESTHESIA N/A 02/11/2016   Procedure: MRI OF THE BRAIN WITHOUT CONTRAST, LUMBAR WITHOUT CONTRAST;  Surgeon: Medication Radiologist, MD;  Location: [facility] OR;  Service: Radiology;  Laterality: N/A;  DR. WOOD/MRI   RIGHT/LEFT HEART CATH AND CORONARY ANGIOGRAPHY N/A 07/26/2018   Procedure: RIGHT/LEFT HEART CATH AND CORONARY ANGIOGRAPHY;  Surgeon: Lyn Records, MD;  Location: [facility] INVASIVE CV LAB;  Service: Cardiovascular;  Laterality: N/A;   Skin melanocytoma excision  2012   "above left clavicle"   STERNAL INCISION RECLOSURE  11/2018   STERNAL WIRE REMOVAL  11/2018   STERNAL WOUND DEBRIDEMENT  11/2018   TEE WITHOUT CARDIOVERSION  03/22/2012   Procedure: TRANSESOPHAGEAL ECHOCARDIOGRAM (TEE);  Surgeon: Donato Schultz, MD;  Location: [facility] ENDOSCOPY;  Service: Cardiovascular;  Laterality: N/A;   TEE WITHOUT CARDIOVERSION N/A 02/08/2016   Procedure: TRANSESOPHAGEAL ECHOCARDIOGRAM (TEE);  Surgeon: Lewayne Bunting, MD;  Location: [facility] ENDOSCOPY;  Service: Cardiovascular;  Laterality: N/A;   Patient Active Problem List   Diagnosis Date Noted   Acquired thrombophilia (HCC) 05/14/2023    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   Goals of care, counseling/discussion 05/05/2020   Multiple myeloma (HCC) 05/05/2020   S/P flap graft 01/07/2019   Coronary artery disease 01/07/2019   Long term (current) use of antibiotics 01/03/2019   Essential hypertension 12/27/2018   Postoperative infection of wound of sternum 12/27/2018   Decreased strength, endurance, and mobility 12/27/2018   Cardiac LV ejection fraction 30-35% 12/27/2018   Decreased activities of daily living (ADL) 12/27/2018   Klebsiella infection 12/27/2018   S/P AVR (aortic valve replacement) 11/23/2018   S/P ascending aortic aneurysm repair 11/23/2018   NICM (nonischemic cardiomyopathy) (HCC) 11/19/2018   AICD (automatic cardioverter/defibrillator) present 11/19/2018   Other symptoms and signs involving the musculoskeletal system 05/08/2018   Long term current use of amiodarone 08/07/2016   Type 2 diabetes mellitus with stage 3b chronic kidney  disease, without long-term current use of insulin (HCC)    Femoral neuropathy    Leg weakness, bilateral    Paroxysmal atrial fibrillation (HCC)    Ventricular fibrillation (HCC) 02/09/2016   Cardiac arrest (HCC) 02/01/2016   Thyroid activity decreased    Arrhythmia    CKD (chronic kidney disease), stage IV (HCC) 08/06/2013    Class: Chronic   Long term current use of anticoagulant 03/04/2013   Atrial flutter (HCC) 03/18/2012    Class: Acute   Chronic systolic heart failure (HCC) 02/13/2012    Class: Acute   Hypertension, accelerated 02/13/2012   Hyperlipidemia 02/13/2012   Left bundle branch block 02/13/2012   Obesity (BMI 30-39.9) 02/13/2012    ONSET DATE: "quite some time" reports years   REFERRING DIAG:  G62.9 (ICD-10-CM) - Neuropathy  THERAPY DIAG:  Muscle weakness (generalized)  Unsteadiness on feet  Other abnormalities of gait and mobility  Rationale for Evaluation and Treatment: Rehabilitation  SUBJECTIVE:                                                                                                                                                                                              SUBJECTIVE STATEMENT: Pt presents for 1st aquatic PT appt - pt states he is late because he was learning where everything was located. Pt reports he has tightness in both legs.    Pt accompanied by: self  PERTINENT HISTORY: HFrEF, atrial fibrillation, VF cardiac arrest s/p ICD, CAD, severe aortic regurgitation s/p AVR 10/2018 and aortic root replacement with CABGx1, aortic dilation, hypertension, hypothyroidism, type 2 diabetes, multiple myeloma, CKD IIIb-IV, migraine  PAIN:  Are you having pain? Yes: NPRS scale: did not rate/10 Pain location: R knee Pain description: tight, stiff  Aggravating factors: reports this is chronic; worse in AM Relieving factors: exercises in bed   PRECAUTIONS: Fall and ICD/Pacemaker  RED FLAGS: None   WEIGHT BEARING RESTRICTIONS: No  FALLS: Has patient fallen in last 6 months? No  LIVING ENVIRONMENT: Lives with: lives with their spouse; caregiver for his spouse and requires ability to transfer her Lives in: House/apartment Stairs:  4 steps to enter; 3 story home Has following equipment at home: Single point cane, Shower bench, and raised commode  PLOF: Independent; caregiver for wife; working part time as Ophthalmologist- requires sitting, standing, walking   PATIENT GOALS: improve R LE strength  OBJECTIVE:  Note: Objective measures were completed at Evaluation unless otherwise noted.  DIAGNOSTIC FINDINGS: none recent  COGNITION: Overall cognitive status: Within functional limits for tasks assessed   SENSATION: Intact in B LEs to light touch   COORDINATION: Alternating  pronation/supination: slightly slowed Alternating toe tap: B WNL Finger to nose: B WNL   POSTURE: rounded shoulders and forward head   *PALPATION: no TTP in B calf; slightly more soft tissue restriction on L  LOWER  EXTREMITY ROM:     Active  Right Eval Left Eval  Hip flexion    Hip extension    Hip abduction    Hip adduction    Hip internal rotation    Hip external rotation    Knee flexion    Knee extension    Ankle dorsiflexion 14 *c/o pain in calf  10  Ankle plantarflexion    Ankle inversion    Ankle eversion     (Blank rows = not tested)  LOWER EXTREMITY MMT:    MMT Right Eval Left Eval  Hip flexion 4 4+  Hip extension    Hip abduction 4 4  Hip adduction 4 4  Hip internal rotation    Hip external rotation    Knee flexion 4 4+  Knee extension 4 4+  Ankle dorsiflexion 4+ 4+  Ankle plantarflexion 4+ 4+  Ankle inversion    Ankle eversion    (Blank rows = not tested)   GAIT: Gait pattern: Significant R-ward trunk lean with gait; slightly slowed gait speed  Assistive device utilized: None Level of assistance: Modified independence   FUNCTIONAL TESTS:    TODAY'S TREATMENT:                                                                                                                              DATE: 08-20-23  Aquatic PT at [facility] - pool temp 90 degrees  Patient seen for aquatic therapy today.  Treatment took place in water 2.5-4 feet deep depending upon activity.  Pt entered and exited the pool via step negotiation with use of hand rails with step by step sequence with supervision.  Warm up; pt performed water walking forwards, backwards and sideways 18' x 4 reps each direction - bar bells held in each hand for assist with balance  Stretching - pt performed runner's stretch RLE and LLE 20 sec x 1 rep each leg for hamstring & gastroc stretch Gastroc stretch with bil. Forefeet on pool wall, heels on floor - 20 sec hold x 1 rep   Hamstring stretch with each foot on pool wall, knee extended - pt performed flexion/extension approx. 10 reps each leg for gentle hamstring stretching  Pt performed marching in place 10 reps - with contralateral UE movement with use of bar  bells; marching forwards/backwards 18' x 2 reps with use of bar bells - cues to perform slowly for improved SLS on each leg  Standing - RLE and LLE - hip flexion with knee extended 10 reps each leg; hip abduction 10 reps each leg: hip extension 5 reps each leg due to c/o fatigue in legs - pt held barbells in each hand for assist with balance  Squats x 10  reps in place Sit to stand from bench in 3.8' water depth  - Lt foot on step to increase weight bearing on RLE for strengthening, 10 reps; then performed same exercise LLE with RLE on step for increased weight bearing and strengthening  Seated LAQ's - 10 reps x 2 sets RLE and LLE  Tap ups 10 reps each leg to aquatic step without UE support on pool edge to improve SLS balance on each leg   Performed Rt and Lt quad stretch in standing at end of session with use of large noodle under lower leg for increased buoyancy and flotation for knee flexion for quad stretch - 20 sec hold x 1 rep RLE and LLE  Pt amb. 18' x 4 reps without use of barbells at end of session with supervision     PATIENT EDUCATION: Education details: prognosis, POC, HEP, answered pt's questions on leg press machine, edu on aquatic therapy as pt requests this Person educated: Patient Education method: Explanation, Demonstration, Tactile cues, Verbal cues, and Handouts Education comprehension: verbalized understanding and returned demonstration  HOME EXERCISE PROGRAM: Access Code: [email or web address] URL: [email or web address] Date: 08/13/2023 Prepared by: [facility] - Outpatient  Rehab - [facility]  Exercises - Standing Gastroc Stretch at Counter  - 1 x daily - 5 x weekly - 2 sets - 30 sec hold - Seated Hamstring Stretch  - 1 x daily - 5 x weekly - 2 sets - 30 sec hold - Standing Hip Abduction with Counter Support  - 1 x daily - 5 x weekly - 2 sets - 10 reps - Standing Hip Extension with Counter Support  - 1 x daily - 5 x weekly - 2 sets - 10 reps - Mini Squat  with Counter Support  - 1 x daily - 5 x weekly - 2 sets - 10 reps  GOALS: Goals reviewed with patient? Yes  SHORT TERM GOALS: Target date: 09/03/2023  Patient to be independent with initial HEP. Baseline: HEP initiated Goal status: INITIAL    LONG TERM GOALS: Target date: +09/24/2023  Patient to be independent with advanced HEP. Baseline: Not yet initiated  Goal status: INITIAL  Patient to demonstrate B LE strength >/=4+/5.  Baseline: See above Goal status: INITIAL  Patient to demonstrate L ankle dorsiflexion AROM to at least 14 deg in order to improve efficiency of gait.   Baseline: 10 deg Goal status: INITIAL  Patient to demonstrate safe stair navigation with alternating reciprocal pattern and no UE support.  Baseline: alternating reciprocal pattern with 1-2 UE support required  Goal status: INITIAL  Patient to score at least 20/24 on DGI in order to decrease risk of falls.  Baseline: 17 Goal status: INITIAL  Patient to demonstrate 25% improvement in R-ward trunk lean with gait.  Baseline: significant R lean Goal status: INITIAL  ASSESSMENT:  CLINICAL IMPRESSION: Aquatic PT session focused on bil. LE strengthening and stretching.  Pt reported fatigue with performing standing hip extension with use of bar bells in each hand for assist with balance after performing 5 reps, which was performed after completing hip flexion and abduction 10 reps each leg in standing.  Seated rest break on pool bench was given due to pt reporting increased tightening in Rt quad in addition to fatigue.  Pt able to resume standing exercises after short seated rest break.  Pt reported mod. fatigue at end of session but tolerated exercises well overall.   OBJECTIVE IMPAIRMENTS: Abnormal gait, decreased balance, difficulty walking, decreased ROM, decreased strength,  impaired flexibility, impaired sensation, postural dysfunction, and pain.   ACTIVITY LIMITATIONS: carrying, lifting, bending,  standing, squatting, stairs, transfers, bathing, toileting, dressing, locomotion level, and caring for others  PARTICIPATION LIMITATIONS: meal prep, cleaning, laundry, shopping, community activity, occupation, yard work, and church  PERSONAL FACTORS: Age, Past/current experiences, Time since onset of injury/illness/exacerbation, and 3+ comorbidities: HFrEF, atrial fibrillation, VF cardiac arrest s/p ICD, CAD, severe aortic regurgitation s/p AVR 10/2018 and aortic root replacement with CABGx1, aortic dilation, hypertension, hypothyroidism, type 2 diabetes, multiple myeloma, CKD IIIb-IV, migraine  are also affecting patient's functional outcome.   REHAB POTENTIAL: Good  CLINICAL DECISION MAKING: Evolving/moderate complexity  EVALUATION COMPLEXITY: Moderate  PLAN:  PT FREQUENCY: 2x/week  PT DURATION: 6 weeks  PLANNED INTERVENTIONS: 97164- PT Re-evaluation, 97110-Therapeutic exercises, 97530- Therapeutic activity, 97112- Neuromuscular re-education, 97535- Self Care, 16109- Manual therapy, [phone]- Gait training, [phone]- Canalith repositioning, [phone]- Aquatic Therapy, Patient/Family education, Balance training, Stair training, Taping, Dry Needling, Joint mobilization, Spinal mobilization, Vestibular training, DME instructions, Cryotherapy, and Moist heat  PLAN FOR NEXT SESSION: review HEP; progress B hip and R knee strength, stairs, balance challenges  AQUATICS: Frequency: 1x/wk Duration: 6 wks Special Instruction: R LE strengthening, balance    Kerry Fort, PT 08/20/23 8:29 PM

## 2023-08-22 ENCOUNTER — Ambulatory Visit: Payer: Medicare Other | Admitting: Physical Therapy

## 2023-08-22 ENCOUNTER — Other Ambulatory Visit: Payer: Self-pay | Admitting: Internal Medicine

## 2023-08-22 ENCOUNTER — Other Ambulatory Visit: Payer: Self-pay

## 2023-08-22 ENCOUNTER — Encounter: Payer: Self-pay | Admitting: Physical Therapy

## 2023-08-22 DIAGNOSIS — I480 Paroxysmal atrial fibrillation: Secondary | ICD-10-CM

## 2023-08-22 DIAGNOSIS — R2681 Unsteadiness on feet: Secondary | ICD-10-CM | POA: Diagnosis not present

## 2023-08-22 DIAGNOSIS — M6281 Muscle weakness (generalized): Secondary | ICD-10-CM

## 2023-08-22 DIAGNOSIS — G629 Polyneuropathy, unspecified: Secondary | ICD-10-CM | POA: Diagnosis not present

## 2023-08-22 DIAGNOSIS — R2689 Other abnormalities of gait and mobility: Secondary | ICD-10-CM

## 2023-08-22 MED ORDER — APIXABAN 5 MG PO TABS: 5.0000 mg | ORAL_TABLET | Freq: Two times a day (BID) | ORAL | 1 refills | Status: DC

## 2023-08-22 NOTE — Therapy (Signed)
OUTPATIENT PHYSICAL THERAPY TREATMENT NOTE   Patient Name: Eric [name]., MD MRN: [id number] DOB:[birth year], 72 y.o., male Today's Date: 08/22/2023   PCP:    Dorothyann Peng, MD   REFERRING PROVIDER: Chilton Si, MD   END OF SESSION:  PT End of Session - 08/22/23 0940     Visit Number 3    Number of Visits 13    Date for PT Re-Evaluation 09/24/23    Authorization Type Medicare/[employer]    PT Start Time 0939    PT Stop Time 1019    PT Time Calculation (min) 40 min    Equipment Utilized During Treatment --    Activity Tolerance Patient tolerated treatment well    Behavior During Therapy [facility] for tasks assessed/performed              Past Medical History:  Diagnosis Date   Acute blood loss anemia    Acute encephalopathy    Acute hypoxemic respiratory failure (HCC)    Acute idiopathic gout of right ankle    Acute lumbar back pain    Acute on chronic systolic heart failure (HCC)    Acute respiratory failure (HCC)    AKI (acute kidney injury) (HCC)    Altered mental status    Aortic aneurysm without rupture (HCC) 02/09/2016   Aortic root enlargement (HCC) 02/13/2012   Aortic valve regurgitation, acquired 02/13/2012   Arrhythmia    Atrial flutter (HCC) 03/18/2012   Back pain    Cardiac arrest (HCC) 02/01/2016   CHF (congestive heart failure) (HCC)    Chronic anticoagulation 03/04/2013   Chronic combined systolic and diastolic heart failure (HCC)    Chronic kidney disease    kidney fx studies increased    Chronic lower back pain    Chronic renal insufficiency    Chronic systolic heart failure (HCC) 02/13/2012   Recent diagnosis 4 / 2013, LVEF 25% by Echo 12/2011  03/2013: Echo at [facility] Conclusions: 1. Left ventricular ejection fraction estimated by 2D at 40-45 percent. 2. Mild concentric left ventricular hypertrophy. 3. Mild left atrial enlargement. 4. Moderate aortic valve regurgitation. 5. The aortic root at the sinus(es) of valsalva is moderately  dilated 6. Mild mitral valve regurgitation. 7.   CKD (chronic kidney disease)    CKD (chronic kidney disease), stage IV (HCC) 08/06/2013   Creatinine 2.4 on 07/04/13    Claustrophobia    Colon cancer screening 03/04/2013   Debility 02/22/2016   Diabetes mellitus type 2 in nonobese [facility])    Diabetes mellitus type 2 in obese    Dysrhythmia    "palpitations"   Encounter for central line placement    Exertional dyspnea 01/2012   Femoral nerve injury 02/22/2016   Femoral neuropathy    Goals of care, counseling/discussion 05/05/2020   HCAP (healthcare-associated pneumonia)    Heart murmur    Hyperlipidemia 02/13/2012   Hypertension    Hypothyroidism    Internal hemorrhoids without mention of complication 04/11/2013   Labile blood pressure    Left bundle branch block 02/13/2012   Leg weakness, bilateral    Long term (current) use of anticoagulants 08/06/2013   Eliquis therapy    Long term current use of amiodarone 08/07/2016   Lower extremity weakness    Migraine 02/13/2012   "opthalmic"   Multiple myeloma (HCC) 05/05/2020   Non-traumatic rhabdomyolysis    Obesity (BMI 30-39.9) 02/13/2012   Pain    Paroxysmal atrial fibrillation (HCC)    Pneumonia    Retroperitoneal bleed  Right ankle pain    Right knee pain    Severe aortic regurgitation 02/09/2016   Special screening for malignant neoplasms, colon 04/11/2013   Thyroid activity decreased    Tibial pain    Varicose vein of leg    right   Ventricular fibrillation (HCC) 02/09/2016   Weakness of both lower extremities    Past Surgical History:  Procedure Laterality Date   AORTIC VALVE REPLACEMENT  11/20/2018   CARDIAC CATHETERIZATION N/A 02/17/2016   Procedure: Left Heart Cath and Coronary Angiography;  Surgeon: Corky Crafts, MD;  Location: [facility] INVASIVE CV LAB;  Service: Cardiovascular;  Laterality: N/A;   CARDIOVERSION  03/22/2012   Procedure: CARDIOVERSION;  Surgeon: Donato Schultz, MD;  Location: [facility] ENDOSCOPY;   Service: Cardiovascular;  Laterality: N/A;   CARDIOVERSION  04/19/2012   Procedure: CARDIOVERSION;  Surgeon: Lesleigh Noe, MD;  Location: [facility] OR;  Service: Cardiovascular;  Laterality: N/A;   COLONOSCOPY N/A 04/11/2013   Procedure: COLONOSCOPY;  Surgeon: Louis Meckel, MD;  Location: [facility] ENDOSCOPY;  Service: Endoscopy;  Laterality: N/A;   COLONOSCOPY N/A 04/11/2013   Procedure: COLONOSCOPY;  Surgeon: Louis Meckel, MD;  Location: [facility] ENDOSCOPY;  Service: Endoscopy;  Laterality: N/A;   EP IMPLANTABLE DEVICE N/A 02/17/2016   Procedure: BiV ICD Insertion CRT-D;  Surgeon: Marinus Maw, MD;  Location: [facility] INVASIVE CV LAB;  Service: Cardiovascular;  Laterality: N/A;   FINGER SURGERY  2012   "4th digit right hand; thumb on left hand"   RADIOLOGY WITH ANESTHESIA N/A 02/11/2016   Procedure: MRI OF THE BRAIN WITHOUT CONTRAST, LUMBAR WITHOUT CONTRAST;  Surgeon: Medication Radiologist, MD;  Location: [facility] OR;  Service: Radiology;  Laterality: N/A;  DR. WOOD/MRI   RIGHT/LEFT HEART CATH AND CORONARY ANGIOGRAPHY N/A 07/26/2018   Procedure: RIGHT/LEFT HEART CATH AND CORONARY ANGIOGRAPHY;  Surgeon: Lyn Records, MD;  Location: [facility] INVASIVE CV LAB;  Service: Cardiovascular;  Laterality: N/A;   Skin melanocytoma excision  2012   "above left clavicle"   STERNAL INCISION RECLOSURE  11/2018   STERNAL WIRE REMOVAL  11/2018   STERNAL WOUND DEBRIDEMENT  11/2018   TEE WITHOUT CARDIOVERSION  03/22/2012   Procedure: TRANSESOPHAGEAL ECHOCARDIOGRAM (TEE);  Surgeon: Donato Schultz, MD;  Location: [facility] ENDOSCOPY;  Service: Cardiovascular;  Laterality: N/A;   TEE WITHOUT CARDIOVERSION N/A 02/08/2016   Procedure: TRANSESOPHAGEAL ECHOCARDIOGRAM (TEE);  Surgeon: Lewayne Bunting, MD;  Location: [facility] ENDOSCOPY;  Service: Cardiovascular;  Laterality: N/A;   Patient Active Problem List   Diagnosis Date Noted   Acquired thrombophilia (HCC) 05/14/2023   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   Goals of care,  counseling/discussion 05/05/2020   Multiple myeloma (HCC) 05/05/2020   S/P flap graft 01/07/2019   Coronary artery disease 01/07/2019   Long term (current) use of antibiotics 01/03/2019   Essential hypertension 12/27/2018   Postoperative infection of wound of sternum 12/27/2018   Decreased strength, endurance, and mobility 12/27/2018   Cardiac LV ejection fraction 30-35% 12/27/2018   Decreased activities of daily living (ADL) 12/27/2018   Klebsiella infection 12/27/2018   S/P AVR (aortic valve replacement) 11/23/2018   S/P ascending aortic aneurysm repair 11/23/2018   NICM (nonischemic cardiomyopathy) (HCC) 11/19/2018   AICD (automatic cardioverter/defibrillator) present 11/19/2018   Other symptoms and signs involving the musculoskeletal system 05/08/2018   Long term current use of amiodarone 08/07/2016   Type 2 diabetes mellitus with stage 3b chronic kidney disease, without long-term current use of insulin (HCC)    Femoral neuropathy  Leg weakness, bilateral    Paroxysmal atrial fibrillation (HCC)    Ventricular fibrillation (HCC) 02/09/2016   Cardiac arrest (HCC) 02/01/2016   Thyroid activity decreased    Arrhythmia    CKD (chronic kidney disease), stage IV (HCC) 08/06/2013    Class: Chronic   Long term current use of anticoagulant 03/04/2013   Atrial flutter (HCC) 03/18/2012    Class: Acute   Chronic systolic heart failure (HCC) 02/13/2012    Class: Acute   Hypertension, accelerated 02/13/2012   Hyperlipidemia 02/13/2012   Left bundle branch block 02/13/2012   Obesity (BMI 30-39.9) 02/13/2012    ONSET DATE: "quite some time" reports years   REFERRING DIAG:  G62.9 (ICD-10-CM) - Neuropathy  THERAPY DIAG:  Muscle weakness (generalized)  Unsteadiness on feet  Other abnormalities of gait and mobility  Rationale for Evaluation and Treatment: Rehabilitation  SUBJECTIVE:                                                                                                                                                                                              SUBJECTIVE STATEMENT: Had first aquatic session and it went pretty well.  No changes.  Want to relieve tightness in thighs.  Pt accompanied by: self  PERTINENT HISTORY: HFrEF, atrial fibrillation, VF cardiac arrest s/p ICD, CAD, severe aortic regurgitation s/p AVR 10/2018 and aortic root replacement with CABGx1, aortic dilation, hypertension, hypothyroidism, type 2 diabetes, multiple myeloma, CKD IIIb-IV, migraine  PAIN:  Are you having pain? Yes: NPRS scale: 3-4/10 Pain location: R knee Pain description: tight, stiff  Aggravating factors: reports this is chronic; worse in AM Relieving factors: exercises in bed   PRECAUTIONS: Fall and ICD/Pacemaker  RED FLAGS: None   WEIGHT BEARING RESTRICTIONS: No  FALLS: Has patient fallen in last 6 months? No  LIVING ENVIRONMENT: Lives with: lives with their spouse; caregiver for his spouse and requires ability to transfer her Lives in: House/apartment Stairs:  4 steps to enter; 3 story home Has following equipment at home: Single point cane, Shower bench, and raised commode  PLOF: Independent; caregiver for wife; working part time as Clinical research associate- requires sitting, standing, walking   PATIENT GOALS: improve R LE strength  OBJECTIVE:    TODAY'S TREATMENT: 08/22/2023 Activity Comments  Sit to stand 2 x 5 reps From mat  Reviewed HEP: Seated hamstring stretch Standing gastroc stretch Standing hip ext Standing hip abduction Minisquat Good form, min cues   Progressed to : minisquat up on toes x 10 Resisted hip abd x 10 Resisted hip ext x 10 Red band, cues for posture  Sidestepping at counter 3 reps Red  band  Alt step taps at cabinet 2 x 10 BUE>1 UE support  Hip flexor stretch, edge of mat, 2 x 30 sec BLE Good report of stretch     Access Code: [email or web address] URL: [email or web address] Date: 08/22/2023 Prepared by: [facility] - Outpatient   Rehab - [facility]  Exercises - Standing Gastroc Stretch at Counter  - 1 x daily - 5 x weekly - 2 sets - 30 sec hold - Seated Hamstring Stretch  - 1 x daily - 5 x weekly - 2 sets - 30 sec hold - Standing Hip Abduction with Counter Support  - 1 x daily - 5 x weekly - 2 sets - 10 reps - Standing Hip Extension with Counter Support  - 1 x daily - 5 x weekly - 2 sets - 10 reps - Mini Squat with Counter Support  - 1 x daily - 5 x weekly - 2 sets - 10 reps - Side Stepping with Resistance at Ankles and Counter Support  - 1 x daily - 7 x weekly - 1 sets - 3-5 reps - Hip Flexor/Ankle stretching  - 1 x daily - 7 x weekly - 1 sets - 3 reps - 30 sec hold ------------------------------------------------- Note: Objective measures were completed at Evaluation unless otherwise noted.  DIAGNOSTIC FINDINGS: none recent  COGNITION: Overall cognitive status: Within functional limits for tasks assessed   SENSATION: Intact in B LEs to light touch   COORDINATION: Alternating pronation/supination: slightly slowed Alternating toe tap: B WNL Finger to nose: B WNL   POSTURE: rounded shoulders and forward head   *PALPATION: no TTP in B calf; slightly more soft tissue restriction on L  LOWER EXTREMITY ROM:     Active  Right Eval Left Eval  Hip flexion    Hip extension    Hip abduction    Hip adduction    Hip internal rotation    Hip external rotation    Knee flexion    Knee extension    Ankle dorsiflexion 14 *c/o pain in calf  10  Ankle plantarflexion    Ankle inversion    Ankle eversion     (Blank rows = not tested)  LOWER EXTREMITY MMT:    MMT Right Eval Left Eval  Hip flexion 4 4+  Hip extension    Hip abduction 4 4  Hip adduction 4 4  Hip internal rotation    Hip external rotation    Knee flexion 4 4+  Knee extension 4 4+  Ankle dorsiflexion 4+ 4+  Ankle plantarflexion 4+ 4+  Ankle inversion    Ankle eversion    (Blank rows = not tested)   GAIT: Gait  pattern: Significant R-ward trunk lean with gait; slightly slowed gait speed  Assistive device utilized: None Level of assistance: Modified independence   FUNCTIONAL TESTS:    TODAY'S TREATMENT:  DATE: 08-20-23  Aquatic PT at [facility] - pool temp 90 degrees  Patient seen for aquatic therapy today.  Treatment took place in water 2.5-4 feet deep depending upon activity.  Pt entered and exited the pool via step negotiation with use of hand rails with step by step sequence with supervision.  Warm up; pt performed water walking forwards, backwards and sideways 18' x 4 reps each direction - bar bells held in each hand for assist with balance  Stretching - pt performed runner's stretch RLE and LLE 20 sec x 1 rep each leg for hamstring & gastroc stretch Gastroc stretch with bil. Forefeet on pool wall, heels on floor - 20 sec hold x 1 rep   Hamstring stretch with each foot on pool wall, knee extended - pt performed flexion/extension approx. 10 reps each leg for gentle hamstring stretching  Pt performed marching in place 10 reps - with contralateral UE movement with use of bar bells; marching forwards/backwards 18' x 2 reps with use of bar bells - cues to perform slowly for improved SLS on each leg  Standing - RLE and LLE - hip flexion with knee extended 10 reps each leg; hip abduction 10 reps each leg: hip extension 5 reps each leg due to c/o fatigue in legs - pt held barbells in each hand for assist with balance  Squats x 10 reps in place Sit to stand from bench in 3.8' water depth  - Lt foot on step to increase weight bearing on RLE for strengthening, 10 reps; then performed same exercise LLE with RLE on step for increased weight bearing and strengthening  Seated LAQ's - 10 reps x 2 sets RLE and LLE  Tap ups 10 reps each leg to aquatic step without UE support on pool  edge to improve SLS balance on each leg   Performed Rt and Lt quad stretch in standing at end of session with use of large noodle under lower leg for increased buoyancy and flotation for knee flexion for quad stretch - 20 sec hold x 1 rep RLE and LLE  Pt amb. 18' x 4 reps without use of barbells at end of session with supervision     PATIENT EDUCATION: Education details: prognosis, POC, HEP, answered pt's questions on leg press machine, edu on aquatic therapy as pt requests this Person educated: Patient Education method: Explanation, Demonstration, Tactile cues, Verbal cues, and Handouts Education comprehension: verbalized understanding and returned demonstration  HOME EXERCISE PROGRAM: Access Code: [email or web address] URL: [email or web address] Date: 08/13/2023 Prepared by: [facility] - Outpatient  Rehab - [facility]  Exercises - Standing Gastroc Stretch at Counter  - 1 x daily - 5 x weekly - 2 sets - 30 sec hold - Seated Hamstring Stretch  - 1 x daily - 5 x weekly - 2 sets - 30 sec hold - Standing Hip Abduction with Counter Support  - 1 x daily - 5 x weekly - 2 sets - 10 reps - Standing Hip Extension with Counter Support  - 1 x daily - 5 x weekly - 2 sets - 10 reps - Mini Squat with Counter Support  - 1 x daily - 5 x weekly - 2 sets - 10 reps  GOALS: Goals reviewed with patient? Yes  SHORT TERM GOALS: Target date: 09/03/2023  Patient to be independent with initial HEP. Baseline: HEP initiated Goal status: IN PROGRESS    LONG TERM GOALS: Target date: +09/24/2023  Patient to be independent with advanced HEP. Baseline: Not yet  initiated  Goal status: IN PROGRESS  Patient to demonstrate B LE strength >/=4+/5.  Baseline: See above Goal status: IN PROGRESS  Patient to demonstrate L ankle dorsiflexion AROM to at least 14 deg in order to improve efficiency of gait.   Baseline: 10 deg Goal status: IN PROGRESS  Patient to demonstrate safe stair navigation with  alternating reciprocal pattern and no UE support.  Baseline: alternating reciprocal pattern with 1-2 UE support required  Goal status: IN PROGRESS  Patient to score at least 20/24 on DGI in order to decrease risk of falls.  Baseline: 17 Goal status: IN PROGRESS  Patient to demonstrate 25% improvement in R-ward trunk lean with gait.  Baseline: significant R lean Goal status: IN PROGRESS  ASSESSMENT:  CLINICAL IMPRESSION: Skilled PT session today focused on strengthening and progressing for hip strengthening and stability.  Used red theraband for resisted hip strengthening and sidestepping, with definite added challenge.  He tolerated strengthening exercises well with brief standing rest breaks.  Added to HEP to continue to progress hip strength as well as hip flexor/quad stretch. He will continue to benefit from skilled PT towards goals for improved functional mobility, strength, and gait.  OBJECTIVE IMPAIRMENTS: Abnormal gait, decreased balance, difficulty walking, decreased ROM, decreased strength, impaired flexibility, impaired sensation, postural dysfunction, and pain.   ACTIVITY LIMITATIONS: carrying, lifting, bending, standing, squatting, stairs, transfers, bathing, toileting, dressing, locomotion level, and caring for others  PARTICIPATION LIMITATIONS: meal prep, cleaning, laundry, shopping, community activity, occupation, yard work, and church  PERSONAL FACTORS: Age, Past/current experiences, Time since onset of injury/illness/exacerbation, and 3+ comorbidities: HFrEF, atrial fibrillation, VF cardiac arrest s/p ICD, CAD, severe aortic regurgitation s/p AVR 10/2018 and aortic root replacement with CABGx1, aortic dilation, hypertension, hypothyroidism, type 2 diabetes, multiple myeloma, CKD IIIb-IV, migraine  are also affecting patient's functional outcome.   REHAB POTENTIAL: Good  CLINICAL DECISION MAKING: Evolving/moderate complexity  EVALUATION COMPLEXITY: Moderate  PLAN:  PT  FREQUENCY: 2x/week  PT DURATION: 6 weeks  PLANNED INTERVENTIONS: 97164- PT Re-evaluation, 97110-Therapeutic exercises, 97530- Therapeutic activity, 97112- Neuromuscular re-education, 97535- Self Care, 40981- Manual therapy, [phone]- Gait training, [phone]- Canalith repositioning, [phone]- Aquatic Therapy, Patient/Family education, Balance training, Stair training, Taping, Dry Needling, Joint mobilization, Spinal mobilization, Vestibular training, DME instructions, Cryotherapy, and Moist heat  PLAN FOR NEXT SESSION: review HEP additions; progress Bilat hip and R knee strength, stairs, balance challenges  AQUATICS: Frequency: 1x/wk Duration: 6 wks Special Instruction: R LE strengthening, balance    Lonia Blood, PT 08/22/23 4:49 PM Phone: [phone] Fax: [phone]  [facility] Re[facility] at [facility] [address], [address] [place], [place] [address] Phone # [phone] Fax # [phone]

## 2023-08-27 ENCOUNTER — Encounter: Payer: Self-pay | Admitting: Physical Therapy

## 2023-08-27 ENCOUNTER — Ambulatory Visit: Payer: Medicare Other | Attending: Cardiovascular Disease | Admitting: Physical Therapy

## 2023-08-27 DIAGNOSIS — M6281 Muscle weakness (generalized): Secondary | ICD-10-CM | POA: Insufficient documentation

## 2023-08-27 DIAGNOSIS — R2681 Unsteadiness on feet: Secondary | ICD-10-CM | POA: Diagnosis not present

## 2023-08-27 DIAGNOSIS — R2689 Other abnormalities of gait and mobility: Secondary | ICD-10-CM | POA: Diagnosis not present

## 2023-08-27 NOTE — Therapy (Signed)
OUTPATIENT PHYSICAL THERAPY TREATMENT NOTE   Patient Name: Eric Hartsock., MD MRN: [id number] DOB:[birth year], 72 y.o., male Today's Date: 08/27/2023   PCP:    Dorothyann Peng, MD   REFERRING PROVIDER: Chilton Si, MD   END OF SESSION:  PT End of Session - 08/27/23 1234     Visit Number 4    Number of Visits 13    Date for PT Re-Evaluation 09/24/23    Authorization Type Medicare/[employer]    PT Start Time 1234    PT Stop Time 1315    PT Time Calculation (min) 41 min    Equipment Utilized During Treatment Gait belt    Activity Tolerance Patient tolerated treatment well    Behavior During Therapy WFL for tasks assessed/performed               Past Medical History:  Diagnosis Date   Acute blood loss anemia    Acute encephalopathy    Acute hypoxemic respiratory failure (HCC)    Acute idiopathic gout of right ankle    Acute lumbar back pain    Acute on chronic systolic heart failure (HCC)    Acute respiratory failure (HCC)    AKI (acute kidney injury) (HCC)    Altered mental status    Aortic aneurysm without rupture (HCC) 02/09/2016   Aortic root enlargement (HCC) 02/13/2012   Aortic valve regurgitation, acquired 02/13/2012   Arrhythmia    Atrial flutter (HCC) 03/18/2012   Back pain    Cardiac arrest (HCC) 02/01/2016   CHF (congestive heart failure) (HCC)    Chronic anticoagulation 03/04/2013   Chronic combined systolic and diastolic heart failure (HCC)    Chronic kidney disease    kidney fx studies increased    Chronic lower back pain    Chronic renal insufficiency    Chronic systolic heart failure (HCC) 02/13/2012   Recent diagnosis 4 / 2013, LVEF 25% by Echo 12/2011  03/2013: Echo at [facility] Cardiology Conclusions: 1. Left ventricular ejection fraction estimated by 2D at 40-45 percent. 2. Mild concentric left ventricular hypertrophy. 3. Mild left atrial enlargement. 4. Moderate aortic valve regurgitation. 5. The aortic root at the sinus(es) of valsalva is  moderately dilated 6. Mild mitral valve regurgitation. 7.   CKD (chronic kidney disease)    CKD (chronic kidney disease), stage IV (HCC) 08/06/2013   Creatinine 2.4 on 07/04/13    Claustrophobia    Colon cancer screening 03/04/2013   Debility 02/22/2016   Diabetes mellitus type 2 in nonobese [facility])    Diabetes mellitus type 2 in obese    Dysrhythmia    "palpitations"   Encounter for central line placement    Exertional dyspnea 01/2012   Femoral nerve injury 02/22/2016   Femoral neuropathy    Goals of care, counseling/discussion 05/05/2020   HCAP (healthcare-associated pneumonia)    Heart murmur    Hyperlipidemia 02/13/2012   Hypertension    Hypothyroidism    Internal hemorrhoids without mention of complication 04/11/2013   Labile blood pressure    Left bundle branch block 02/13/2012   Leg weakness, bilateral    Long term (current) use of anticoagulants 08/06/2013   Eliquis therapy    Long term current use of amiodarone 08/07/2016   Lower extremity weakness    Migraine 02/13/2012   "opthalmic"   Multiple myeloma (HCC) 05/05/2020   Non-traumatic rhabdomyolysis    Obesity (BMI 30-39.9) 02/13/2012   Pain    Paroxysmal atrial fibrillation (HCC)    Pneumonia    Retroperitoneal  bleed    Right ankle pain    Right knee pain    Severe aortic regurgitation 02/09/2016   Special screening for malignant neoplasms, colon 04/11/2013   Thyroid activity decreased    Tibial pain    Varicose vein of leg    right   Ventricular fibrillation (HCC) 02/09/2016   Weakness of both lower extremities    Past Surgical History:  Procedure Laterality Date   AORTIC VALVE REPLACEMENT  11/20/2018   CARDIAC CATHETERIZATION N/A 02/17/2016   Procedure: Left Heart Cath and Coronary Angiography;  Surgeon: Corky Crafts, MD;  Location: [facility] INVASIVE CV LAB;  Service: Cardiovascular;  Laterality: N/A;   CARDIOVERSION  03/22/2012   Procedure: CARDIOVERSION;  Surgeon: Donato Schultz, MD;  Location: [facility]  ENDOSCOPY;  Service: Cardiovascular;  Laterality: N/A;   CARDIOVERSION  04/19/2012   Procedure: CARDIOVERSION;  Surgeon: Lesleigh Noe, MD;  Location: [facility] OR;  Service: Cardiovascular;  Laterality: N/A;   COLONOSCOPY N/A 04/11/2013   Procedure: COLONOSCOPY;  Surgeon: Louis Meckel, MD;  Location: [facility] ENDOSCOPY;  Service: Endoscopy;  Laterality: N/A;   COLONOSCOPY N/A 04/11/2013   Procedure: COLONOSCOPY;  Surgeon: Louis Meckel, MD;  Location: [facility] ENDOSCOPY;  Service: Endoscopy;  Laterality: N/A;   EP IMPLANTABLE DEVICE N/A 02/17/2016   Procedure: BiV ICD Insertion CRT-D;  Surgeon: Marinus Maw, MD;  Location: [facility] INVASIVE CV LAB;  Service: Cardiovascular;  Laterality: N/A;   FINGER SURGERY  2012   "4th digit right hand; thumb on left hand"   RADIOLOGY WITH ANESTHESIA N/A 02/11/2016   Procedure: MRI OF THE BRAIN WITHOUT CONTRAST, LUMBAR WITHOUT CONTRAST;  Surgeon: Medication Radiologist, MD;  Location: [facility] OR;  Service: Radiology;  Laterality: N/A;  DR. WOOD/MRI   RIGHT/LEFT HEART CATH AND CORONARY ANGIOGRAPHY N/A 07/26/2018   Procedure: RIGHT/LEFT HEART CATH AND CORONARY ANGIOGRAPHY;  Surgeon: Lyn Records, MD;  Location: [facility] INVASIVE CV LAB;  Service: Cardiovascular;  Laterality: N/A;   Skin melanocytoma excision  2012   "above left clavicle"   STERNAL INCISION RECLOSURE  11/2018   STERNAL WIRE REMOVAL  11/2018   STERNAL WOUND DEBRIDEMENT  11/2018   TEE WITHOUT CARDIOVERSION  03/22/2012   Procedure: TRANSESOPHAGEAL ECHOCARDIOGRAM (TEE);  Surgeon: Donato Schultz, MD;  Location: [facility] ENDOSCOPY;  Service: Cardiovascular;  Laterality: N/A;   TEE WITHOUT CARDIOVERSION N/A 02/08/2016   Procedure: TRANSESOPHAGEAL ECHOCARDIOGRAM (TEE);  Surgeon: Lewayne Bunting, MD;  Location: [facility] ENDOSCOPY;  Service: Cardiovascular;  Laterality: N/A;   Patient Active Problem List   Diagnosis Date Noted   Acquired thrombophilia (HCC) 05/14/2023   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   Goals of care,  counseling/discussion 05/05/2020   Multiple myeloma (HCC) 05/05/2020   S/P flap graft 01/07/2019   Coronary artery disease 01/07/2019   Long term (current) use of antibiotics 01/03/2019   Essential hypertension 12/27/2018   Postoperative infection of wound of sternum 12/27/2018   Decreased strength, endurance, and mobility 12/27/2018   Cardiac LV ejection fraction 30-35% 12/27/2018   Decreased activities of daily living (ADL) 12/27/2018   Klebsiella infection 12/27/2018   S/P AVR (aortic valve replacement) 11/23/2018   S/P ascending aortic aneurysm repair 11/23/2018   NICM (nonischemic cardiomyopathy) (HCC) 11/19/2018   AICD (automatic cardioverter/defibrillator) present 11/19/2018   Other symptoms and signs involving the musculoskeletal system 05/08/2018   Long term current use of amiodarone 08/07/2016   Type 2 diabetes mellitus with stage 3b chronic kidney disease, without long-term current use of insulin (HCC)  Femoral neuropathy    Leg weakness, bilateral    Paroxysmal atrial fibrillation (HCC)    Ventricular fibrillation (HCC) 02/09/2016   Cardiac arrest (HCC) 02/01/2016   Thyroid activity decreased    Arrhythmia    CKD (chronic kidney disease), stage IV (HCC) 08/06/2013    Class: Chronic   Long term current use of anticoagulant 03/04/2013   Atrial flutter (HCC) 03/18/2012    Class: Acute   Chronic systolic heart failure (HCC) 02/13/2012    Class: Acute   Hypertension, accelerated 02/13/2012   Hyperlipidemia 02/13/2012   Left bundle branch block 02/13/2012   Obesity (BMI 30-39.9) 02/13/2012    ONSET DATE: "quite some time" reports years   REFERRING DIAG:  G62.9 (ICD-10-CM) - Neuropathy  THERAPY DIAG:  Muscle weakness (generalized)  Unsteadiness on feet  Other abnormalities of gait and mobility  Rationale for Evaluation and Treatment: Rehabilitation  SUBJECTIVE:                                                                                                                                                                                              SUBJECTIVE STATEMENT: Nothing new.  Tried the exercises and they were okay.  Discussed steps and pt currently uses one rail and ascends step through pattern (most days), but does report decreased confidence with going up steps.  Currently goes 2-3 steps and then stops.    Pt accompanied by: self  PERTINENT HISTORY: HFrEF, atrial fibrillation, VF cardiac arrest s/p ICD, CAD, severe aortic regurgitation s/p AVR 10/2018 and aortic root replacement with CABGx1, aortic dilation, hypertension, hypothyroidism, type 2 diabetes, multiple myeloma, CKD IIIb-IV, migraine  PAIN:  Are you having pain? Yes: NPRS scale: 3-4/10 Pain location: R knee Pain description: tight, stiff  Aggravating factors: reports this is chronic; worse in AM Relieving factors: exercises in bed   PRECAUTIONS: Fall and ICD/Pacemaker  RED FLAGS: None   WEIGHT BEARING RESTRICTIONS: No  FALLS: Has patient fallen in last 6 months? No  LIVING ENVIRONMENT: Lives with: lives with their spouse; caregiver for his spouse and requires ability to transfer her Lives in: House/apartment Stairs:  4 steps to enter; 3 story home Has following equipment at home: Single point cane, Shower bench, and raised commode  PLOF: Independent; caregiver for wife; working part time as Clinical research associate- requires sitting, standing, walking   PATIENT GOALS: improve R LE strength  OBJECTIVE:    TODAY'S TREATMENT: 08/27/2023 Activity Comments  NuStep, Level 3, 4 extremities x 5 minutes Cues for neutral knee position to prevent external knee rotation  Seated hamstring stretch, 2 x 30 sec   Standing hip flexor  stretch, (from gastroc stretch position) Good form with cues   Resisted hip abd x 10 Resisted hip ext x 10 Red band, cues for posture  Sidestepping, resisted with red band, at counter 3 reps Review, good form  Alt step taps at cabinet 2 x 10 BUE>1 UE  support  Hip flexor stretch, edge of mat, 2 x 30 sec BLE Review; good form  Monster walk along counter, forward, 4 reps  Red theraband  Forward step ups to 4" step, 10 reps, BUE support Reports tightness more RLE (front of knee)       Access Code: [email or web address] URL: [email or web address] Date: 08/22/2023 Prepared by: [facility] - Outpatient  Rehab - [facility]  Exercises - Standing Gastroc Stretch at Counter  - 1 x daily - 5 x weekly - 2 sets - 30 sec hold - Seated Hamstring Stretch  - 1 x daily - 5 x weekly - 2 sets - 30 sec hold - Standing Hip Abduction with Counter Support  - 1 x daily - 5 x weekly - 2 sets - 10 reps - Standing Hip Extension with Counter Support  - 1 x daily - 5 x weekly - 2 sets - 10 reps - Mini Squat with Counter Support  - 1 x daily - 5 x weekly - 2 sets - 10 reps - Side Stepping with Resistance at Ankles and Counter Support  - 1 x daily - 7 x weekly - 1 sets - 3-5 reps - Hip Flexor/Ankle stretching  - 1 x daily - 7 x weekly - 1 sets - 3 reps - 30 sec hold  PATIENT EDUCATION: Education details: continue current HEP, with addition of standing hip flexor stretch (gastroc stretch position) Person educated: Patient Education method: Explanation, Demonstration, and Verbal cues Education comprehension: verbalized understanding and returned demonstration  ------------------------------------------------- Note: Objective measures were completed at Evaluation unless otherwise noted.  DIAGNOSTIC FINDINGS: none recent  COGNITION: Overall cognitive status: Within functional limits for tasks assessed   SENSATION: Intact in B LEs to light touch   COORDINATION: Alternating pronation/supination: slightly slowed Alternating toe tap: B WNL Finger to nose: B WNL   POSTURE: rounded shoulders and forward head   *PALPATION: no TTP in B calf; slightly more soft tissue restriction on L  LOWER EXTREMITY ROM:     Active  Right Eval Left Eval  Hip  flexion    Hip extension    Hip abduction    Hip adduction    Hip internal rotation    Hip external rotation    Knee flexion    Knee extension    Ankle dorsiflexion 14 *c/o pain in calf  10  Ankle plantarflexion    Ankle inversion    Ankle eversion     (Blank rows = not tested)  LOWER EXTREMITY MMT:    MMT Right Eval Left Eval  Hip flexion 4 4+  Hip extension    Hip abduction 4 4  Hip adduction 4 4  Hip internal rotation    Hip external rotation    Knee flexion 4 4+  Knee extension 4 4+  Ankle dorsiflexion 4+ 4+  Ankle plantarflexion 4+ 4+  Ankle inversion    Ankle eversion    (Blank rows = not tested)   GAIT: Gait pattern: Significant R-ward trunk lean with gait; slightly slowed gait speed  Assistive device utilized: None Level of assistance: Modified independence   FUNCTIONAL TESTS:    TODAY'S TREATMENT:  DATE: 08-20-23  Aquatic PT at [facility] - pool temp 90 degrees  Patient seen for aquatic therapy today.  Treatment took place in water 2.5-4 feet deep depending upon activity.  Pt entered and exited the pool via step negotiation with use of hand rails with step by step sequence with supervision.  Warm up; pt performed water walking forwards, backwards and sideways 18' x 4 reps each direction - bar bells held in each hand for assist with balance  Stretching - pt performed runner's stretch RLE and LLE 20 sec x 1 rep each leg for hamstring & gastroc stretch Gastroc stretch with bil. Forefeet on pool wall, heels on floor - 20 sec hold x 1 rep   Hamstring stretch with each foot on pool wall, knee extended - pt performed flexion/extension approx. 10 reps each leg for gentle hamstring stretching  Pt performed marching in place 10 reps - with contralateral UE movement with use of bar bells; marching forwards/backwards 18' x 2 reps with use of  bar bells - cues to perform slowly for improved SLS on each leg  Standing - RLE and LLE - hip flexion with knee extended 10 reps each leg; hip abduction 10 reps each leg: hip extension 5 reps each leg due to c/o fatigue in legs - pt held barbells in each hand for assist with balance  Squats x 10 reps in place Sit to stand from bench in 3.8' water depth  - Lt foot on step to increase weight bearing on RLE for strengthening, 10 reps; then performed same exercise LLE with RLE on step for increased weight bearing and strengthening  Seated LAQ's - 10 reps x 2 sets RLE and LLE  Tap ups 10 reps each leg to aquatic step without UE support on pool edge to improve SLS balance on each leg   Performed Rt and Lt quad stretch in standing at end of session with use of large noodle under lower leg for increased buoyancy and flotation for knee flexion for quad stretch - 20 sec hold x 1 rep RLE and LLE  Pt amb. 18' x 4 reps without use of barbells at end of session with supervision     PATIENT EDUCATION: Education details: prognosis, POC, HEP, answered pt's questions on leg press machine, edu on aquatic therapy as pt requests this Person educated: Patient Education method: Explanation, Demonstration, Tactile cues, Verbal cues, and Handouts Education comprehension: verbalized understanding and returned demonstration  HOME EXERCISE PROGRAM: Access Code: [email or web address] URL: [email or web address] Date: 08/13/2023 Prepared by: [facility] - Outpatient  Rehab - [facility]  Exercises - Standing Gastroc Stretch at Counter  - 1 x daily - 5 x weekly - 2 sets - 30 sec hold - Seated Hamstring Stretch  - 1 x daily - 5 x weekly - 2 sets - 30 sec hold - Standing Hip Abduction with Counter Support  - 1 x daily - 5 x weekly - 2 sets - 10 reps - Standing Hip Extension with Counter Support  - 1 x daily - 5 x weekly - 2 sets - 10 reps - Mini Squat with Counter Support  - 1 x daily - 5 x weekly - 2 sets - 10  reps  GOALS: Goals reviewed with patient? Yes  SHORT TERM GOALS: Target date: 09/03/2023  Patient to be independent with initial HEP. Baseline: HEP initiated Goal status: IN PROGRESS    LONG TERM GOALS: Target date: +09/24/2023  Patient to be independent with advanced HEP. Baseline: Not  yet initiated  Goal status: IN PROGRESS  Patient to demonstrate B LE strength >/=4+/5.  Baseline: See above Goal status: IN PROGRESS  Patient to demonstrate L ankle dorsiflexion AROM to at least 14 deg in order to improve efficiency of gait.   Baseline: 10 deg Goal status: IN PROGRESS  Patient to demonstrate safe stair navigation with alternating reciprocal pattern and no UE support.  Baseline: alternating reciprocal pattern with 1-2 UE support required  Goal status: IN PROGRESS  Patient to score at least 20/24 on DGI in order to decrease risk of falls.  Baseline: 17 Goal status: IN PROGRESS  Patient to demonstrate 25% improvement in R-ward trunk lean with gait.  Baseline: significant R lean Goal status: IN PROGRESS  ASSESSMENT:  CLINICAL IMPRESSION: Skilled PT session today continued to focus on flexibility as well as lower extremity strength.  Pt continues to be challenged with resisted sidestepping as well as resisted hip abduction/extensor strengthening.  Progressed to quad strength on steps today, and worked on standing hip flexor stretch.  He appears to have less Trendelenburg type pattern today, but he still feels "dipping" on RLE.  He will continue to benefit from skilled PT towards goals for improved functional mobility, strength, and gait.  OBJECTIVE IMPAIRMENTS: Abnormal gait, decreased balance, difficulty walking, decreased ROM, decreased strength, impaired flexibility, impaired sensation, postural dysfunction, and pain.   ACTIVITY LIMITATIONS: carrying, lifting, bending, standing, squatting, stairs, transfers, bathing, toileting, dressing, locomotion level, and caring for  others  PARTICIPATION LIMITATIONS: meal prep, cleaning, laundry, shopping, community activity, occupation, yard work, and church  PERSONAL FACTORS: Age, Past/current experiences, Time since onset of injury/illness/exacerbation, and 3+ comorbidities: HFrEF, atrial fibrillation, VF cardiac arrest s/p ICD, CAD, severe aortic regurgitation s/p AVR 10/2018 and aortic root replacement with CABGx1, aortic dilation, hypertension, hypothyroidism, type 2 diabetes, multiple myeloma, CKD IIIb-IV, migraine  are also affecting patient's functional outcome.   REHAB POTENTIAL: Good  CLINICAL DECISION MAKING: Evolving/moderate complexity  EVALUATION COMPLEXITY: Moderate  PLAN:  PT FREQUENCY: 2x/week  PT DURATION: 6 weeks  PLANNED INTERVENTIONS: 97164- PT Re-evaluation, 97110-Therapeutic exercises, 97530- Therapeutic activity, 97112- Neuromuscular re-education, 97535- Self Care, [id number]- Manual therapy, [phone]- Gait training, [phone]- Canalith repositioning, [phone]- Aquatic Therapy, Patient/Family education, Balance training, Stair training, Taping, Dry Needling, Joint mobilization, Spinal mobilization, Vestibular training, DME instructions, Cryotherapy, and Moist heat  PLAN FOR NEXT SESSION: Recumbent bike for RLE flexibility; progress Bilat hip and R knee strength, stairs-step ups, step downs, step taps (consider adding to HEP), balance challenges  AQUATICS: Frequency: 1x/wk Duration: 6 wks Special Instruction: R LE strengthening, balance    Lonia Blood, PT 08/27/23 2:12 PM Phone: [phone] Fax: [phone]  [facility] at [facility] [address] [place], [place] [address] Phone # [phone] Fax # [phone]

## 2023-08-29 ENCOUNTER — Ambulatory Visit: Payer: Medicare Other

## 2023-08-29 DIAGNOSIS — R2681 Unsteadiness on feet: Secondary | ICD-10-CM

## 2023-08-29 DIAGNOSIS — M6281 Muscle weakness (generalized): Secondary | ICD-10-CM

## 2023-08-29 DIAGNOSIS — R2689 Other abnormalities of gait and mobility: Secondary | ICD-10-CM

## 2023-08-29 NOTE — Therapy (Signed)
OUTPATIENT PHYSICAL THERAPY TREATMENT NOTE   Patient Name: Eric [name]., MD MRN: [id number] DOB:25-Apr-1951, 72 y.o., male Today's Date: 08/29/2023   PCP:    Dorothyann Peng, MD   REFERRING PROVIDER: Chilton Si, MD   END OF SESSION:  PT End of Session - 08/29/23 0808     Visit Number 5    Number of Visits 13    Date for PT Re-Evaluation 09/24/23    Authorization Type Medicare/[employer]    PT Start Time 0808   late arrival   PT Stop Time 0845    PT Time Calculation (min) 37 min    Equipment Utilized During Treatment Gait belt    Activity Tolerance Patient tolerated treatment well    Behavior During Therapy WFL for tasks assessed/performed               Past Medical History:  Diagnosis Date   Acute blood loss anemia    Acute encephalopathy    Acute hypoxemic respiratory failure (HCC)    Acute idiopathic gout of right ankle    Acute lumbar back pain    Acute on chronic systolic heart failure (HCC)    Acute respiratory failure (HCC)    AKI (acute kidney injury) (HCC)    Altered mental status    Aortic aneurysm without rupture (HCC) 02/09/2016   Aortic root enlargement (HCC) 02/13/2012   Aortic valve regurgitation, acquired 02/13/2012   Arrhythmia    Atrial flutter (HCC) 03/18/2012   Back pain    Cardiac arrest (HCC) 02/01/2016   CHF (congestive heart failure) (HCC)    Chronic anticoagulation 03/04/2013   Chronic combined systolic and diastolic heart failure (HCC)    Chronic kidney disease    kidney fx studies increased    Chronic lower back pain    Chronic renal insufficiency    Chronic systolic heart failure (HCC) 02/13/2012   Recent diagnosis 4 / 2013, LVEF 25% by Echo 12/2011  03/2013: Echo at [facility] Conclusions: 1. Left ventricular ejection fraction estimated by 2D at 40-45 percent. 2. Mild concentric left ventricular hypertrophy. 3. Mild left atrial enlargement. 4. Moderate aortic valve regurgitation. 5. The aortic root at the sinus(es) of  valsalva is moderately dilated 6. Mild mitral valve regurgitation. 7.   CKD (chronic kidney disease)    CKD (chronic kidney disease), stage IV (HCC) 08/06/2013   Creatinine 2.4 on 07/04/13    Claustrophobia    Colon cancer screening 03/04/2013   Debility 02/22/2016   Diabetes mellitus type 2 in nonobese [facility])    Diabetes mellitus type 2 in obese    Dysrhythmia    "palpitations"   Encounter for central line placement    Exertional dyspnea 01/2012   Femoral nerve injury 02/22/2016   Femoral neuropathy    Goals of care, counseling/discussion 05/05/2020   HCAP (healthcare-associated pneumonia)    Heart murmur    Hyperlipidemia 02/13/2012   Hypertension    Hypothyroidism    Internal hemorrhoids without mention of complication 04/11/2013   Labile blood pressure    Left bundle branch block 02/13/2012   Leg weakness, bilateral    Long term (current) use of anticoagulants 08/06/2013   Eliquis therapy    Long term current use of amiodarone 08/07/2016   Lower extremity weakness    Migraine 02/13/2012   "opthalmic"   Multiple myeloma (HCC) 05/05/2020   Non-traumatic rhabdomyolysis    Obesity (BMI 30-39.9) 02/13/2012   Pain    Paroxysmal atrial fibrillation (HCC)    Pneumonia  Retroperitoneal bleed    Right ankle pain    Right knee pain    Severe aortic regurgitation 02/09/2016   Special screening for malignant neoplasms, colon 04/11/2013   Thyroid activity decreased    Tibial pain    Varicose vein of leg    right   Ventricular fibrillation (HCC) 02/09/2016   Weakness of both lower extremities    Past Surgical History:  Procedure Laterality Date   AORTIC VALVE REPLACEMENT  11/20/2018   CARDIAC CATHETERIZATION N/A 02/17/2016   Procedure: Left Heart Cath and Coronary Angiography;  Surgeon: Corky Crafts, MD;  Location: [facility] INVASIVE CV LAB;  Service: Cardiovascular;  Laterality: N/A;   CARDIOVERSION  03/22/2012   Procedure: CARDIOVERSION;  Surgeon: Donato Schultz, MD;   Location: [facility] ENDOSCOPY;  Service: Cardiovascular;  Laterality: N/A;   CARDIOVERSION  04/19/2012   Procedure: CARDIOVERSION;  Surgeon: Lesleigh Noe, MD;  Location: [facility] OR;  Service: Cardiovascular;  Laterality: N/A;   COLONOSCOPY N/A 04/11/2013   Procedure: COLONOSCOPY;  Surgeon: Louis Meckel, MD;  Location: [facility] ENDOSCOPY;  Service: Endoscopy;  Laterality: N/A;   COLONOSCOPY N/A 04/11/2013   Procedure: COLONOSCOPY;  Surgeon: Louis Meckel, MD;  Location: [facility] ENDOSCOPY;  Service: Endoscopy;  Laterality: N/A;   EP IMPLANTABLE DEVICE N/A 02/17/2016   Procedure: BiV ICD Insertion CRT-D;  Surgeon: Marinus Maw, MD;  Location: [facility] INVASIVE CV LAB;  Service: Cardiovascular;  Laterality: N/A;   FINGER SURGERY  2012   "4th digit right hand; thumb on left hand"   RADIOLOGY WITH ANESTHESIA N/A 02/11/2016   Procedure: MRI OF THE BRAIN WITHOUT CONTRAST, LUMBAR WITHOUT CONTRAST;  Surgeon: Medication Radiologist, MD;  Location: [facility] OR;  Service: Radiology;  Laterality: N/A;  DR. WOOD/MRI   RIGHT/LEFT HEART CATH AND CORONARY ANGIOGRAPHY N/A 07/26/2018   Procedure: RIGHT/LEFT HEART CATH AND CORONARY ANGIOGRAPHY;  Surgeon: Lyn Records, MD;  Location: [facility] INVASIVE CV LAB;  Service: Cardiovascular;  Laterality: N/A;   Skin melanocytoma excision  2012   "above left clavicle"   STERNAL INCISION RECLOSURE  11/2018   STERNAL WIRE REMOVAL  11/2018   STERNAL WOUND DEBRIDEMENT  11/2018   TEE WITHOUT CARDIOVERSION  03/22/2012   Procedure: TRANSESOPHAGEAL ECHOCARDIOGRAM (TEE);  Surgeon: Donato Schultz, MD;  Location: [facility] ENDOSCOPY;  Service: Cardiovascular;  Laterality: N/A;   TEE WITHOUT CARDIOVERSION N/A 02/08/2016   Procedure: TRANSESOPHAGEAL ECHOCARDIOGRAM (TEE);  Surgeon: Lewayne Bunting, MD;  Location: [facility] ENDOSCOPY;  Service: Cardiovascular;  Laterality: N/A;   Patient Active Problem List   Diagnosis Date Noted   Acquired thrombophilia (HCC) 05/14/2023   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   Goals of  care, counseling/discussion 05/05/2020   Multiple myeloma (HCC) 05/05/2020   S/P flap graft 01/07/2019   Coronary artery disease 01/07/2019   Long term (current) use of antibiotics 01/03/2019   Essential hypertension 12/27/2018   Postoperative infection of wound of sternum 12/27/2018   Decreased strength, endurance, and mobility 12/27/2018   Cardiac LV ejection fraction 30-35% 12/27/2018   Decreased activities of daily living (ADL) 12/27/2018   Klebsiella infection 12/27/2018   S/P AVR (aortic valve replacement) 11/23/2018   S/P ascending aortic aneurysm repair 11/23/2018   NICM (nonischemic cardiomyopathy) (HCC) 11/19/2018   AICD (automatic cardioverter/defibrillator) present 11/19/2018   Other symptoms and signs involving the musculoskeletal system 05/08/2018   Long term current use of amiodarone 08/07/2016   Type 2 diabetes mellitus with stage 3b chronic kidney disease, without long-term current use of insulin (HCC)  Femoral neuropathy    Leg weakness, bilateral    Paroxysmal atrial fibrillation (HCC)    Ventricular fibrillation (HCC) 02/09/2016   Cardiac arrest (HCC) 02/01/2016   Thyroid activity decreased    Arrhythmia    CKD (chronic kidney disease), stage IV (HCC) 08/06/2013    Class: Chronic   Long term current use of anticoagulant 03/04/2013   Atrial flutter (HCC) 03/18/2012    Class: Acute   Chronic systolic heart failure (HCC) 02/13/2012    Class: Acute   Hypertension, accelerated 02/13/2012   Hyperlipidemia 02/13/2012   Left bundle branch block 02/13/2012   Obesity (BMI 30-39.9) 02/13/2012    ONSET DATE: "quite some time" reports years   REFERRING DIAG:  G62.9 (ICD-10-CM) - Neuropathy  THERAPY DIAG:  Muscle weakness (generalized)  Unsteadiness on feet  Other abnormalities of gait and mobility  Rationale for Evaluation and Treatment: Rehabilitation  SUBJECTIVE:                                                                                                                                                                                              SUBJECTIVE STATEMENT: Nothing new.  No pains, just stiffness in the right thigh  Pt accompanied by: self  PERTINENT HISTORY: HFrEF, atrial fibrillation, VF cardiac arrest s/p ICD, CAD, severe aortic regurgitation s/p AVR 10/2018 and aortic root replacement with CABGx1, aortic dilation, hypertension, hypothyroidism, type 2 diabetes, multiple myeloma, CKD IIIb-IV, migraine  PAIN:  Are you having pain? Yes: NPRS scale: 3-4/10 Pain location: R knee Pain description: tight, stiff  Aggravating factors: reports this is chronic; worse in AM Relieving factors: exercises in bed   PRECAUTIONS: Fall and ICD/Pacemaker  RED FLAGS: None   WEIGHT BEARING RESTRICTIONS: No  FALLS: Has patient fallen in last 6 months? No  LIVING ENVIRONMENT: Lives with: lives with their spouse; caregiver for his spouse and requires ability to transfer her Lives in: House/apartment Stairs:  4 steps to enter; 3 story home Has following equipment at home: Single point cane, Shower bench, and raised commode  PLOF: Independent; caregiver for wife; working part time as Clinical research associate- requires sitting, standing, walking   PATIENT GOALS: improve R LE strength  OBJECTIVE:   TODAY'S TREATMENT: 08/29/23 Activity Comments  Sidestepping x 2 min red loop at ankles   Hip abd and ext 1x10 red loop ankles   Monster walk x 5 laps // bars red loop ankles   Sit to stand 2x5 reps red loop around knees   Tall kneeling 1x10  For thigh stretch and desensitization  Multi-sensory balance Narrow BOS: EO/EC x 30 sec, head turns EO/EC. Postural perturbations  x 1.5 min      TODAY'S TREATMENT: 08/27/2023 Activity Comments  NuStep, Level 3, 4 extremities x 5 minutes Cues for neutral knee position to prevent external knee rotation  Seated hamstring stretch, 2 x 30 sec   Standing hip flexor stretch, (from gastroc stretch position) Good form  with cues   Resisted hip abd x 10 Resisted hip ext x 10 Red band, cues for posture  Sidestepping, resisted with red band, at counter 3 reps Review, good form  Alt step taps at cabinet 2 x 10 BUE>1 UE support  Hip flexor stretch, edge of mat, 2 x 30 sec BLE Review; good form  Monster walk along counter, forward, 4 reps  Red theraband  Forward step ups to 4" step, 10 reps, BUE support Reports tightness more RLE (front of knee)       Access Code: [email or web address] URL: [email or web address] Date: 08/22/2023 Prepared by: [facility] - Outpatient  Rehab - [facility]  Exercises - Standing Gastroc Stretch at Counter  - 1 x daily - 5 x weekly - 2 sets - 30 sec hold - Seated Hamstring Stretch  - 1 x daily - 5 x weekly - 2 sets - 30 sec hold - Standing Hip Abduction with Counter Support  - 1 x daily - 5 x weekly - 2 sets - 10 reps - Standing Hip Extension with Counter Support  - 1 x daily - 5 x weekly - 2 sets - 10 reps - Mini Squat with Counter Support  - 1 x daily - 5 x weekly - 2 sets - 10 reps - Side Stepping with Resistance at Ankles and Counter Support  - 1 x daily - 7 x weekly - 1 sets - 3-5 reps - Hip Flexor/Ankle stretching  - 1 x daily - 7 x weekly - 1 sets - 3 reps - 30 sec hold - Tall Kneeling Hip Hinge  - 1 x daily - 7 x weekly  PATIENT EDUCATION: Education details: continue current HEP, with addition of standing hip flexor stretch (gastroc stretch position) Person educated: Patient Education method: Explanation, Demonstration, and Verbal cues Education comprehension: verbalized understanding and returned demonstration  ------------------------------------------------- Note: Objective measures were completed at Evaluation unless otherwise noted.  DIAGNOSTIC FINDINGS: none recent  COGNITION: Overall cognitive status: Within functional limits for tasks assessed   SENSATION: Intact in B LEs to light touch   COORDINATION: Alternating pronation/supination: slightly  slowed Alternating toe tap: B WNL Finger to nose: B WNL   POSTURE: rounded shoulders and forward head   *PALPATION: no TTP in B calf; slightly more soft tissue restriction on L  LOWER EXTREMITY ROM:     Active  Right Eval Left Eval  Hip flexion    Hip extension    Hip abduction    Hip adduction    Hip internal rotation    Hip external rotation    Knee flexion    Knee extension    Ankle dorsiflexion 14 *c/o pain in calf  10  Ankle plantarflexion    Ankle inversion    Ankle eversion     (Blank rows = not tested)  LOWER EXTREMITY MMT:    MMT Right Eval Left Eval  Hip flexion 4 4+  Hip extension    Hip abduction 4 4  Hip adduction 4 4  Hip internal rotation    Hip external rotation    Knee flexion 4 4+  Knee extension 4 4+  Ankle dorsiflexion 4+ 4+  Ankle plantarflexion  4+ 4+  Ankle inversion    Ankle eversion    (Blank rows = not tested)   GAIT: Gait pattern: Significant R-ward trunk lean with gait; slightly slowed gait speed  Assistive device utilized: None Level of assistance: Modified independence   FUNCTIONAL TESTS:    TODAY'S TREATMENT:                                                                                                                              DATE: 08-20-23  Aquatic PT at [facility] - pool temp 90 degrees  Patient seen for aquatic therapy today.  Treatment took place in water 2.5-4 feet deep depending upon activity.  Pt entered and exited the pool via step negotiation with use of hand rails with step by step sequence with supervision.  Warm up; pt performed water walking forwards, backwards and sideways 18' x 4 reps each direction - bar bells held in each hand for assist with balance  Stretching - pt performed runner's stretch RLE and LLE 20 sec x 1 rep each leg for hamstring & gastroc stretch Gastroc stretch with bil. Forefeet on pool wall, heels on floor - 20 sec hold x 1 rep   Hamstring stretch with each foot on pool wall,  knee extended - pt performed flexion/extension approx. 10 reps each leg for gentle hamstring stretching  Pt performed marching in place 10 reps - with contralateral UE movement with use of bar bells; marching forwards/backwards 18' x 2 reps with use of bar bells - cues to perform slowly for improved SLS on each leg  Standing - RLE and LLE - hip flexion with knee extended 10 reps each leg; hip abduction 10 reps each leg: hip extension 5 reps each leg due to c/o fatigue in legs - pt held barbells in each hand for assist with balance  Squats x 10 reps in place Sit to stand from bench in 3.8' water depth  - Lt foot on step to increase weight bearing on RLE for strengthening, 10 reps; then performed same exercise LLE with RLE on step for increased weight bearing and strengthening  Seated LAQ's - 10 reps x 2 sets RLE and LLE  Tap ups 10 reps each leg to aquatic step without UE support on pool edge to improve SLS balance on each leg   Performed Rt and Lt quad stretch in standing at end of session with use of large noodle under lower leg for increased buoyancy and flotation for knee flexion for quad stretch - 20 sec hold x 1 rep RLE and LLE  Pt amb. 18' x 4 reps without use of barbells at end of session with supervision       GOALS: Goals reviewed with patient? Yes  SHORT TERM GOALS: Target date: 09/03/2023  Patient to be independent with initial HEP. Baseline: HEP initiated Goal status: IN PROGRESS    LONG TERM GOALS: Target date: +09/24/2023  Patient to be independent with  advanced HEP. Baseline: Not yet initiated  Goal status: IN PROGRESS  Patient to demonstrate B LE strength >/=4+/5.  Baseline: See above Goal status: IN PROGRESS  Patient to demonstrate L ankle dorsiflexion AROM to at least 14 deg in order to improve efficiency of gait.   Baseline: 10 deg Goal status: IN PROGRESS  Patient to demonstrate safe stair navigation with alternating reciprocal pattern and no UE  support.  Baseline: alternating reciprocal pattern with 1-2 UE support required  Goal status: IN PROGRESS  Patient to score at least 20/24 on DGI in order to decrease risk of falls.  Baseline: 17 Goal status: IN PROGRESS  Patient to demonstrate 25% improvement in R-ward trunk lean with gait.  Baseline: significant R lean Goal status: IN PROGRESS  ASSESSMENT:  CLINICAL IMPRESSION: Review of HEP activities with occasional cues in form for body mechanics/isolation for hip strength.  Trial of tall kneeling with hip hinge to provide stretch to anterior thighs with intent of desensitization which was tolerated fairly. Ended with multi-sensory balance activities for postural stability and facilitate righting reactions to reduce risk for falls.  Continued sessions indicated to progress POC details and improve LE strength, balance, and reduce pain-limited movements.  OBJECTIVE IMPAIRMENTS: Abnormal gait, decreased balance, difficulty walking, decreased ROM, decreased strength, impaired flexibility, impaired sensation, postural dysfunction, and pain.   ACTIVITY LIMITATIONS: carrying, lifting, bending, standing, squatting, stairs, transfers, bathing, toileting, dressing, locomotion level, and caring for others  PARTICIPATION LIMITATIONS: meal prep, cleaning, laundry, shopping, community activity, occupation, yard work, and church  PERSONAL FACTORS: Age, Past/current experiences, Time since onset of injury/illness/exacerbation, and 3+ comorbidities: HFrEF, atrial fibrillation, VF cardiac arrest s/p ICD, CAD, severe aortic regurgitation s/p AVR 10/2018 and aortic root replacement with CABGx1, aortic dilation, hypertension, hypothyroidism, type 2 diabetes, multiple myeloma, CKD IIIb-IV, migraine  are also affecting patient's functional outcome.   REHAB POTENTIAL: Good  CLINICAL DECISION MAKING: Evolving/moderate complexity  EVALUATION COMPLEXITY: Moderate  PLAN:  PT FREQUENCY: 2x/week  PT  DURATION: 6 weeks  PLANNED INTERVENTIONS: 97164- PT Re-evaluation, 97110-Therapeutic exercises, 97530- Therapeutic activity, 97112- Neuromuscular re-education, 97535- Self Care, 16109- Manual therapy, [phone]- Gait training, [phone]- Canalith repositioning, [phone]- Aquatic Therapy, Patient/Family education, Balance training, Stair training, Taping, Dry Needling, Joint mobilization, Spinal mobilization, Vestibular training, DME instructions, Cryotherapy, and Moist heat  PLAN FOR NEXT SESSION: Recumbent bike for RLE flexibility; progress Bilat hip and R knee strength, stairs-step ups, step downs, step taps (consider adding to HEP), balance challenges  AQUATICS: Frequency: 1x/wk Duration: 6 wks Special Instruction: R LE strengthening, balance    9:39 AM, 08/29/23 M. Shary Decamp, PT, DPT Physical Therapist- [place] Office Number: [phone]

## 2023-08-31 ENCOUNTER — Inpatient Hospital Stay: Payer: Medicare Other

## 2023-08-31 ENCOUNTER — Ambulatory Visit: Payer: Medicare Other | Admitting: Family Medicine

## 2023-08-31 ENCOUNTER — Ambulatory Visit: Payer: Medicare Other | Admitting: Hematology & Oncology

## 2023-08-31 ENCOUNTER — Encounter: Payer: Self-pay | Admitting: Family Medicine

## 2023-08-31 VITALS — BP 126/90 | HR 70 | Temp 98.1°F | Ht 68.0 in | Wt 191.0 lb

## 2023-08-31 DIAGNOSIS — I130 Hypertensive heart and chronic kidney disease with heart failure and stage 1 through stage 4 chronic kidney disease, or unspecified chronic kidney disease: Secondary | ICD-10-CM

## 2023-08-31 DIAGNOSIS — E785 Hyperlipidemia, unspecified: Secondary | ICD-10-CM

## 2023-08-31 DIAGNOSIS — E032 Hypothyroidism due to medicaments and other exogenous substances: Secondary | ICD-10-CM | POA: Diagnosis not present

## 2023-08-31 DIAGNOSIS — N400 Enlarged prostate without lower urinary tract symptoms: Secondary | ICD-10-CM

## 2023-08-31 DIAGNOSIS — E782 Mixed hyperlipidemia: Secondary | ICD-10-CM | POA: Diagnosis not present

## 2023-08-31 DIAGNOSIS — D6869 Other thrombophilia: Secondary | ICD-10-CM | POA: Diagnosis not present

## 2023-08-31 DIAGNOSIS — E1169 Type 2 diabetes mellitus with other specified complication: Secondary | ICD-10-CM | POA: Diagnosis not present

## 2023-08-31 DIAGNOSIS — N1832 Chronic kidney disease, stage 3b: Secondary | ICD-10-CM | POA: Diagnosis not present

## 2023-08-31 DIAGNOSIS — E1122 Type 2 diabetes mellitus with diabetic chronic kidney disease: Secondary | ICD-10-CM | POA: Diagnosis not present

## 2023-08-31 DIAGNOSIS — I129 Hypertensive chronic kidney disease with stage 1 through stage 4 chronic kidney disease, or unspecified chronic kidney disease: Secondary | ICD-10-CM

## 2023-08-31 DIAGNOSIS — I480 Paroxysmal atrial fibrillation: Secondary | ICD-10-CM

## 2023-08-31 MED ORDER — SEMAGLUTIDE(0.25 OR 0.5MG/DOS) 2 MG/3ML ~~LOC~~ SOPN: PEN_INJECTOR | SUBCUTANEOUS | 2 refills | Status: DC

## 2023-08-31 MED ORDER — ALLOPURINOL 100 MG PO TABS: 100.0000 mg | ORAL_TABLET | Freq: Every day | ORAL | 2 refills | Status: DC

## 2023-08-31 MED ORDER — ROSUVASTATIN CALCIUM 20 MG PO TABS: ORAL_TABLET | ORAL | 2 refills | Status: DC

## 2023-08-31 MED ORDER — LEVOTHYROXINE SODIUM 75 MCG PO TABS: 75.0000 ug | ORAL_TABLET | Freq: Every day | ORAL | 2 refills | Status: DC

## 2023-08-31 MED ORDER — CARVEDILOL 6.25 MG PO TABS: ORAL_TABLET | ORAL | 1 refills | Status: DC

## 2023-08-31 NOTE — Progress Notes (Signed)
I,Jameka J Llittleton, CMA,acting as a Neurosurgeon for [employer], NP.,have documented all relevant documentation on the behalf of Ellender Hose, NP,as directed by  Ellender Hose, NP while in the presence of Ellender Hose, NP.  Subjective:  Patient ID: Eric [name]., MD , male    DOB: [birth year] , 72 y.o.   MRN: [id number]  Chief Complaint  Patient presents with   Hypertension   Diabetes    HPI  Patient is a 72 year old male who is here today for chronic disease management .  He has a diagnosis of diabetes, chronic kidney disease and high blood pressure.  Patient states that at his last appointment with nephrologist he had ultrasound of his kidneys carried out, the ultrasound did not show any hydronephrosis or abnormalities so the nephrologist stated he could take his allopurinol to once daily and also increased his vitamin D to 5000 units daily.  He is also states that the cardiologist discontinued  Bidil from his medication regimen. All changes were noted and updated.  Renal ultrasound performed on 07/23/2023, showed enlarged prostate with effects on the blood patient will be referred to urology for further evaluation he states he used to see urology in the past but this was about 5 years ago or more.  Patient is on Eliquis 5 mg twice daily for paroxysmal atrial fibrillation.     Hypertension This is a chronic problem. The current episode started more than 1 year ago. The problem has been gradually improving since onset. The problem is controlled. Pertinent negatives include no blurred vision, chest pain, palpitations or shortness of breath. Risk factors for coronary artery disease include diabetes mellitus, dyslipidemia, sedentary lifestyle, obesity, male gender and stress. Past treatments include beta blockers and diuretics. The current treatment provides moderate improvement. Hypertensive end-organ damage includes kidney disease. There is no history of CVA.  Diabetes He presents for his follow-up  diabetic visit. He has type 2 diabetes mellitus. There are no hypoglycemic associated symptoms. Pertinent negatives for diabetes include no blurred vision, no chest pain, no polydipsia, no polyphagia and no polyuria. There are no hypoglycemic complications. There are no diabetic complications. Pertinent negatives for diabetic complications include no CVA. Risk factors for coronary artery disease include obesity and sedentary lifestyle. Current diabetic treatment includes oral agent (dual therapy). He is following a diabetic diet. Meal planning includes avoidance of concentrated sweets. He participates in exercise intermittently. His breakfast blood glucose is taken between 8-9 am. His breakfast blood glucose range is generally 110-130 mg/dl. An ACE inhibitor/angiotensin II receptor blocker is not being taken. He sees a podiatrist.Eye exam is not current.  Hyperlipidemia This is a chronic problem. The current episode started more than 1 year ago. Exacerbating diseases include diabetes and hypothyroidism. He has no history of obesity. Pertinent negatives include no chest pain or shortness of breath. Current antihyperlipidemic treatment includes statins.     Past Medical History:  Diagnosis Date   Acute blood loss anemia    Acute encephalopathy    Acute hypoxemic respiratory failure (HCC)    Acute idiopathic gout of right ankle    Acute lumbar back pain    Acute on chronic systolic heart failure (HCC)    Acute respiratory failure (HCC)    AKI (acute kidney injury) (HCC)    Altered mental status    Aortic aneurysm without rupture (HCC) 02/09/2016   Aortic root enlargement (HCC) 02/13/2012   Aortic valve regurgitation, acquired 02/13/2012   Arrhythmia    Atrial flutter (HCC) 03/18/2012  Back pain    Cardiac arrest (HCC) 02/01/2016   CHF (congestive heart failure) (HCC)    Chronic anticoagulation 03/04/2013   Chronic combined systolic and diastolic heart failure (HCC)    Chronic kidney disease     kidney fx studies increased    Chronic lower back pain    Chronic renal insufficiency    Chronic systolic heart failure (HCC) 02/13/2012   Recent diagnosis 4 / 2013, LVEF 25% by Echo 12/2011  03/2013: Echo at [facility] Conclusions: 1. Left ventricular ejection fraction estimated by 2D at 40-45 percent. 2. Mild concentric left ventricular hypertrophy. 3. Mild left atrial enlargement. 4. Moderate aortic valve regurgitation. 5. The aortic root at the sinus(es) of valsalva is moderately dilated 6. Mild mitral valve regurgitation. 7.   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    Goals of care, counseling/discussion 05/05/2020   HCAP (healthcare-associated pneumonia)    Heart murmur    Hyperlipidemia 02/13/2012   Hypertension    Hypothyroidism    Internal hemorrhoids without mention of complication 04/11/2013   Labile blood pressure    Left bundle branch block 02/13/2012   Leg weakness, bilateral    Long term (current) use of anticoagulants 08/06/2013   Eliquis therapy    Long term current use of amiodarone 08/07/2016   Lower extremity weakness    Migraine 02/13/2012   "opthalmic"   Multiple myeloma (HCC) 05/05/2020   Non-traumatic rhabdomyolysis    Obesity (BMI 30-39.9) 02/13/2012   Pain    Paroxysmal atrial fibrillation (HCC)    Pneumonia    Retroperitoneal bleed    Right ankle pain    Right knee pain    Severe aortic regurgitation 02/09/2016   Special screening for malignant neoplasms, colon 04/11/2013   Thyroid activity decreased    Tibial pain    Varicose vein of leg    right    Ventricular fibrillation (HCC) 02/09/2016   Weakness of both lower extremities      Family History  Problem Relation Age of Onset   Hypertension Mother    Heart disease Mother    Heart failure Mother    Diabetes Mother    Hypertension Father    Heart disease Father    Heart failure Father      Current Outpatient Medications:    acetaminophen (TYLENOL) 500 MG tablet, Take 1,000 mg by mouth every 4 (four) hours as needed for moderate pain or headache., Disp: , Rfl:    amiodarone (PACERONE) 100 MG tablet, Take 1 tablet (100 mg total) by mouth daily., Disp: 90 tablet, Rfl: 3   apixaban (ELIQUIS) 5 MG TABS tablet, Take 1 tablet (5 mg total) by mouth 2 (two) times daily., Disp: 180 tablet, Rfl: 1   Cholecalciferol (VITAMIN D) 125 MCG (5000 UT) CAPS, Take by mouth daily at 6 (six) AM., Disp: , Rfl:    cholecalciferol (VITAMIN D3) 25 MCG (1000 UNIT) tablet, Take 5,000 Units by mouth daily., Disp: , Rfl:    famciclovir (FAMVIR) 500 MG tablet, Take 1 tablet (500 mg  total) by mouth daily., Disp: 30 tablet, Rfl: 3   FARXIGA 10 MG TABS tablet, TAKE 1 TABLET(10 MG) BY MOUTH DAILY BEFORE BREAKFAST, Disp: 30 tablet, Rfl: 5   ferrous sulfate 324 (65 Fe) MG TBEC, Take 1 tablet by mouth daily., Disp: , Rfl:    fluticasone (FLONASE) 50 MCG/ACT nasal spray, Place 2 sprays into both nostrils daily., Disp: 16 g, Rfl: 2   furosemide (LASIX) 80 MG tablet, Alternate one tablet (80mg ) and half tablet (40mg ) every other day. Okay to take whole tablet as needed for fluid retention, edema., Disp: 90 tablet, Rfl: 3   Hypromellose (ARTIFICIAL TEARS OP), Place 1 drop into both eyes daily as needed (dry eyes)., Disp: , Rfl:    loperamide (IMODIUM) 2 MG capsule, Take 2 mg by mouth as needed for diarrhea or loose stools., Disp: , Rfl:    potassium chloride 20 MEQ/15ML (10%) SOLN, TAKE 15 ML BY MOUTH TWICE DAILY (Patient taking differently: daily.), Disp: 946 mL, Rfl: 1   vitamin B-12 (CYANOCOBALAMIN) 100 MCG tablet,  Take 100 mcg by mouth daily., Disp: , Rfl:    vitamin C (ASCORBIC ACID) 500 MG tablet, Take 500 mg by mouth daily., Disp: , Rfl:    allopurinol (ZYLOPRIM) 100 MG tablet, Take 1 tablet (100 mg total) by mouth daily., Disp: 90 tablet, Rfl: 2   carvedilol (COREG) 6.25 MG tablet, Take 1 tablet by mouth twice a day, Disp: 180 tablet, Rfl: 1   levothyroxine (SYNTHROID) 75 MCG tablet, Take 1 tablet (75 mcg total) by mouth daily before breakfast., Disp: 90 tablet, Rfl: 2   rosuvastatin (CRESTOR) 20 MG tablet, TAKE 1 TABLET(20 MG) BY MOUTH DAILY, Disp: 90 tablet, Rfl: 2   Semaglutide,0.25 or 0.5MG /DOS, 2 MG/3ML SOPN, Inject 0.5 into the skin once weekly., Disp: 3 mL, Rfl: 2   Allergies  Allergen Reactions   Lactose Intolerance (Gi) Diarrhea   Lactose Diarrhea     Review of Systems  Constitutional: Negative.   Eyes: Negative.  Negative for blurred vision.  Respiratory:  Negative for shortness of breath.   Cardiovascular:  Negative for chest pain and palpitations.  Endocrine: Negative for polydipsia, polyphagia and polyuria.  Musculoskeletal: Negative.   Skin: Negative.   Psychiatric/Behavioral: Negative.       Today's Vitals   08/31/23 0901 08/31/23 0941  BP: (!) 132/90 (!) 126/90  Pulse: 70   Temp: 98.1 F (36.7 C)   Weight: 191 lb (86.6 kg)   Height: 5\' 8"  (1.727 m)   PainSc: 0-No pain    Body mass index is 29.04 kg/m.  Wt Readings from Last 3 Encounters:  08/31/23 191 lb (86.6 kg)  08/10/23 195 lb 1.3 oz (88.5 kg)  07/26/23 192 lb (87.1 kg)    The 10-year ASCVD risk score (Arnett DK, et al., 2019) is: 30.2%   Values used to calculate the score:     Age: 56 years     Sex: Male     Is Non-Hispanic African American: Yes     Diabetic: Yes     Tobacco smoker: No     Systolic Blood Pressure: 126 mmHg     Is BP treated: Yes     HDL Cholesterol: 64 mg/dL     Total Cholesterol: 175 mg/dL  Objective:  Physical Exam Cardiovascular:     Rate and Rhythm: Normal rate and regular  rhythm.  Pulmonary:     Effort: Pulmonary effort is normal.     Breath sounds: Normal breath sounds.  Skin:    General: Skin is warm and dry.  Neurological:     Mental Status: He is alert and oriented to person, place, and time.  Psychiatric:        Behavior: Behavior normal.         Assessment And Plan:  Type 2 diabetes mellitus with stage 3b chronic kidney disease, without long-term current use of insulin (HCC) Assessment & Plan: Encouraged to keep BS well controlled and avoid use of NSAIDs.  Orders: -     CBC -     CMP14+EGFR -     Hemoglobin A1c -     Semaglutide(0.25 or 0.5MG /DOS); Inject 0.5 into the skin once weekly.  Dispense: 3 mL; Refill: 2  Hypertensive heart and renal disease with renal failure, stage 1 through stage 4 or unspecified chronic kidney disease, with heart failure (HCC) -     Carvedilol; Take 1 tablet by mouth twice a day  Dispense: 180 tablet; Refill: 1  Mixed hyperlipidemia  Acquired thrombophilia (HCC) Assessment & Plan: On Eliquis BID d/t PAF   Enlarged prostate -     Ambulatory referral to Urology  Hypothyroidism due to medication -     Levothyroxine Sodium; Take 1 tablet (75 mcg total) by mouth daily before breakfast.  Dispense: 90 tablet; Refill: 2  Dyslipidemia due to type 2 diabetes mellitus (HCC) Assessment & Plan: Low fat diet advised and continue current treatment with Crestor 20 mg every day.  Orders: -     Lipid panel -     Rosuvastatin Calcium; TAKE 1 TABLET(20 MG) BY MOUTH DAILY  Dispense: 90 tablet; Refill: 2  Other orders -     Allopurinol; Take 1 tablet (100 mg total) by mouth daily.  Dispense: 90 tablet; Refill: 2    Return in 4 months (on 12/30/2023) for  diabetes, blood pressure.  Patient was given opportunity to ask questions. Patient verbalized understanding of the plan and was able to repeat key elements of the plan. All questions were answered to their satisfaction.    I, Ellender Hose, NP, have reviewed all  documentation for this visit. The documentation on 09/10/23 for the exam, diagnosis, procedures, and orders are all accurate and complete.   IF YOU HAVE BEEN REFERRED TO A SPECIALIST, IT MAY TAKE 1-2 WEEKS TO SCHEDULE/PROCESS THE REFERRAL. IF YOU HAVE NOT HEARD FROM US/SPECIALIST IN TWO WEEKS, PLEASE GIVE [place] A CALL AT [phone].

## 2023-09-01 ENCOUNTER — Encounter: Payer: Self-pay | Admitting: Hematology & Oncology

## 2023-09-01 LAB — CMP14+EGFR
ALT: 19 [IU]/L (ref 0–44)
AST: 25 [IU]/L (ref 0–40)
Albumin: 3.5 g/dL — ABNORMAL LOW (ref 3.8–4.8)
Alkaline Phosphatase: 88 [IU]/L (ref 44–121)
BUN/Creatinine Ratio: 15 (ref 10–24)
BUN: 33 mg/dL — ABNORMAL HIGH (ref 8–27)
Bilirubin Total: 1.1 mg/dL (ref 0.0–1.2)
CO2: 22 mmol/L (ref 20–29)
Calcium: 8.4 mg/dL — ABNORMAL LOW (ref 8.6–10.2)
Chloride: 110 mmol/L — ABNORMAL HIGH (ref 96–106)
Creatinine, Ser: 2.18 mg/dL — ABNORMAL HIGH (ref 0.76–1.27)
Globulin, Total: 2 g/dL (ref 1.5–4.5)
Glucose: 90 mg/dL (ref 70–99)
Potassium: 3.8 mmol/L (ref 3.5–5.2)
Sodium: 145 mmol/L — ABNORMAL HIGH (ref 134–144)
Total Protein: 5.5 g/dL — ABNORMAL LOW (ref 6.0–8.5)
eGFR: 32 mL/min/{1.73_m2} — ABNORMAL LOW (ref >59)

## 2023-09-01 LAB — HEMOGLOBIN A1C
Est. average glucose Bld gHb Est-mCnc: 123 mg/dL
Hgb A1c MFr Bld: 5.9 % — ABNORMAL HIGH (ref 4.8–5.6)

## 2023-09-01 LAB — LIPID PANEL
Chol/HDL Ratio: 2.7 {ratio} (ref 0.0–5.0)
Cholesterol, Total: 175 mg/dL (ref 100–199)
HDL: 64 mg/dL (ref >39)
LDL Chol Calc (NIH): 101 mg/dL — ABNORMAL HIGH (ref 0–99)
Triglycerides: 50 mg/dL (ref 0–149)
VLDL Cholesterol Cal: 10 mg/dL (ref 5–40)

## 2023-09-01 LAB — CBC
Hematocrit: 41.1 % (ref 37.5–51.0)
Hemoglobin: 13.2 g/dL (ref 13.0–17.7)
MCH: 30.8 pg (ref 26.6–33.0)
MCHC: 32.1 g/dL (ref 31.5–35.7)
MCV: 96 fL (ref 79–97)
Platelets: 159 10*3/uL (ref 150–450)
RBC: 4.28 x10E6/uL (ref 4.14–5.80)
RDW: 15.3 % (ref 11.6–15.4)
WBC: 5.8 10*3/uL (ref 3.4–10.8)

## 2023-09-03 ENCOUNTER — Ambulatory Visit: Payer: Medicare Other | Admitting: Physical Therapy

## 2023-09-03 ENCOUNTER — Encounter: Payer: Self-pay | Admitting: Physical Therapy

## 2023-09-03 DIAGNOSIS — M6281 Muscle weakness (generalized): Secondary | ICD-10-CM | POA: Diagnosis not present

## 2023-09-03 DIAGNOSIS — R2681 Unsteadiness on feet: Secondary | ICD-10-CM

## 2023-09-03 DIAGNOSIS — R2689 Other abnormalities of gait and mobility: Secondary | ICD-10-CM | POA: Diagnosis not present

## 2023-09-03 NOTE — Therapy (Signed)
OUTPATIENT PHYSICAL THERAPY/AQUATIC THERAPY TREATMENT NOTE   Patient Name: Eric Adem., MD MRN: [id number] DOB:Oct 11, 1950, 72 y.o., male Today's Date: 09/03/2023   PCP:    Dorothyann Peng, MD   REFERRING PROVIDER: Chilton Si, MD   END OF SESSION:  PT End of Session - 09/03/23 2018     Visit Number 6    Number of Visits 13    Date for PT Re-Evaluation 09/24/23    Authorization Type Medicare/[employer]    PT Start Time 1545   pt arrived 35" late for aquatic therapy appt.   PT Stop Time 1625    PT Time Calculation (min) 40 min    Equipment Utilized During Treatment Other (comment)   large barbells, aquatic cuffs   Activity Tolerance Patient tolerated treatment well    Behavior During Therapy WFL for tasks assessed/performed             Past Medical History:  Diagnosis Date   Acute blood loss anemia    Acute encephalopathy    Acute hypoxemic respiratory failure (HCC)    Acute idiopathic gout of right ankle    Acute lumbar back pain    Acute on chronic systolic heart failure (HCC)    Acute respiratory failure (HCC)    AKI (acute kidney injury) (HCC)    Altered mental status    Aortic aneurysm without rupture (HCC) 02/09/2016   Aortic root enlargement (HCC) 02/13/2012   Aortic valve regurgitation, acquired 02/13/2012   Arrhythmia    Atrial flutter (HCC) 03/18/2012   Back pain    Cardiac arrest (HCC) 02/01/2016   CHF (congestive heart failure) (HCC)    Chronic anticoagulation 03/04/2013   Chronic combined systolic and diastolic heart failure (HCC)    Chronic kidney disease    kidney fx studies increased    Chronic lower back pain    Chronic renal insufficiency    Chronic systolic heart failure (HCC) 02/13/2012   Recent diagnosis 4 / 2013, LVEF 25% by Echo 12/2011  03/2013: Echo at [facility] Cardiology Conclusions: 1. Left ventricular ejection fraction estimated by 2D at 40-45 percent. 2. Mild concentric left ventricular hypertrophy. 3. Mild left atrial  enlargement. 4. Moderate aortic valve regurgitation. 5. The aortic root at the sinus(es) of valsalva is moderately dilated 6. Mild mitral valve regurgitation. 7.   CKD (chronic kidney disease)    CKD (chronic kidney disease), stage IV (HCC) 08/06/2013   Creatinine 2.4 on 07/04/13    Claustrophobia    Colon cancer screening 03/04/2013   Debility 02/22/2016   Diabetes mellitus type 2 in nonobese [facility])    Diabetes mellitus type 2 in obese    Dysrhythmia    "palpitations"   Encounter for central line placement    Exertional dyspnea 01/2012   Femoral nerve injury 02/22/2016   Femoral neuropathy    Goals of care, counseling/discussion 05/05/2020   HCAP (healthcare-associated pneumonia)    Heart murmur    Hyperlipidemia 02/13/2012   Hypertension    Hypothyroidism    Internal hemorrhoids without mention of complication 04/11/2013   Labile blood pressure    Left bundle branch block 02/13/2012   Leg weakness, bilateral    Long term (current) use of anticoagulants 08/06/2013   Eliquis therapy    Long term current use of amiodarone 08/07/2016   Lower extremity weakness    Migraine 02/13/2012   "opthalmic"   Multiple myeloma (HCC) 05/05/2020   Non-traumatic rhabdomyolysis    Obesity (BMI 30-39.9) 02/13/2012   Pain  Paroxysmal atrial fibrillation (HCC)    Pneumonia    Retroperitoneal bleed    Right ankle pain    Right knee pain    Severe aortic regurgitation 02/09/2016   Special screening for malignant neoplasms, colon 04/11/2013   Thyroid activity decreased    Tibial pain    Varicose vein of leg    right   Ventricular fibrillation (HCC) 02/09/2016   Weakness of both lower extremities    Past Surgical History:  Procedure Laterality Date   AORTIC VALVE REPLACEMENT  11/20/2018   CARDIAC CATHETERIZATION N/A 02/17/2016   Procedure: Left Heart Cath and Coronary Angiography;  Surgeon: Corky Crafts, MD;  Location: [facility] INVASIVE CV LAB;  Service: Cardiovascular;  Laterality: N/A;    CARDIOVERSION  03/22/2012   Procedure: CARDIOVERSION;  Surgeon: Donato Schultz, MD;  Location: [facility] ENDOSCOPY;  Service: Cardiovascular;  Laterality: N/A;   CARDIOVERSION  04/19/2012   Procedure: CARDIOVERSION;  Surgeon: Lesleigh Noe, MD;  Location: [facility] OR;  Service: Cardiovascular;  Laterality: N/A;   COLONOSCOPY N/A 04/11/2013   Procedure: COLONOSCOPY;  Surgeon: Louis Meckel, MD;  Location: [facility] ENDOSCOPY;  Service: Endoscopy;  Laterality: N/A;   COLONOSCOPY N/A 04/11/2013   Procedure: COLONOSCOPY;  Surgeon: Louis Meckel, MD;  Location: [facility] ENDOSCOPY;  Service: Endoscopy;  Laterality: N/A;   EP IMPLANTABLE DEVICE N/A 02/17/2016   Procedure: BiV ICD Insertion CRT-D;  Surgeon: Marinus Maw, MD;  Location: [facility] INVASIVE CV LAB;  Service: Cardiovascular;  Laterality: N/A;   FINGER SURGERY  2012   "4th digit right hand; thumb on left hand"   RADIOLOGY WITH ANESTHESIA N/A 02/11/2016   Procedure: MRI OF THE BRAIN WITHOUT CONTRAST, LUMBAR WITHOUT CONTRAST;  Surgeon: Medication Radiologist, MD;  Location: [facility] OR;  Service: Radiology;  Laterality: N/A;  DR. WOOD/MRI   RIGHT/LEFT HEART CATH AND CORONARY ANGIOGRAPHY N/A 07/26/2018   Procedure: RIGHT/LEFT HEART CATH AND CORONARY ANGIOGRAPHY;  Surgeon: Lyn Records, MD;  Location: [facility] INVASIVE CV LAB;  Service: Cardiovascular;  Laterality: N/A;   Skin melanocytoma excision  2012   "above left clavicle"   STERNAL INCISION RECLOSURE  11/2018   STERNAL WIRE REMOVAL  11/2018   STERNAL WOUND DEBRIDEMENT  11/2018   TEE WITHOUT CARDIOVERSION  03/22/2012   Procedure: TRANSESOPHAGEAL ECHOCARDIOGRAM (TEE);  Surgeon: Donato Schultz, MD;  Location: [facility] ENDOSCOPY;  Service: Cardiovascular;  Laterality: N/A;   TEE WITHOUT CARDIOVERSION N/A 02/08/2016   Procedure: TRANSESOPHAGEAL ECHOCARDIOGRAM (TEE);  Surgeon: Lewayne Bunting, MD;  Location: [facility] ENDOSCOPY;  Service: Cardiovascular;  Laterality: N/A;   Patient Active Problem List   Diagnosis Date Noted   Acquired  thrombophilia (HCC) 05/14/2023   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   Goals of care, counseling/discussion 05/05/2020   Multiple myeloma (HCC) 05/05/2020   S/P flap graft 01/07/2019   Coronary artery disease 01/07/2019   Long term (current) use of antibiotics 01/03/2019   Essential hypertension 12/27/2018   Postoperative infection of wound of sternum 12/27/2018   Decreased strength, endurance, and mobility 12/27/2018   Cardiac LV ejection fraction 30-35% 12/27/2018   Decreased activities of daily living (ADL) 12/27/2018   Klebsiella infection 12/27/2018   S/P AVR (aortic valve replacement) 11/23/2018   S/P ascending aortic aneurysm repair 11/23/2018   NICM (nonischemic cardiomyopathy) (HCC) 11/19/2018   AICD (automatic cardioverter/defibrillator) present 11/19/2018   Other symptoms and signs involving the musculoskeletal system 05/08/2018   Long term current use of amiodarone 08/07/2016   Type 2 diabetes mellitus with stage  3b chronic kidney disease, without long-term current use of insulin (HCC)    Femoral neuropathy    Leg weakness, bilateral    Paroxysmal atrial fibrillation (HCC)    Ventricular fibrillation (HCC) 02/09/2016   Cardiac arrest (HCC) 02/01/2016   Thyroid activity decreased    Arrhythmia    CKD (chronic kidney disease), stage IV (HCC) 08/06/2013    Class: Chronic   Long term current use of anticoagulant 03/04/2013   Atrial flutter (HCC) 03/18/2012    Class: Acute   Chronic systolic heart failure (HCC) 02/13/2012    Class: Acute   Hypertension, accelerated 02/13/2012   Hyperlipidemia 02/13/2012   Left bundle branch block 02/13/2012   Obesity (BMI 30-39.9) 02/13/2012    ONSET DATE: "quite some time" reports years   REFERRING DIAG:  G62.9 (ICD-10-CM) - Neuropathy  THERAPY DIAG:  Muscle weakness (generalized)  Unsteadiness on feet  Other abnormalities of gait and mobility  Rationale for Evaluation and Treatment:  Rehabilitation  SUBJECTIVE:                                                                                                                                                                                             SUBJECTIVE STATEMENT: Pt presents for 1st aquatic PT appt - pt states he is late because he was learning where everything was located. Pt reports he has tightness in both legs.    Pt accompanied by: self  PERTINENT HISTORY: HFrEF, atrial fibrillation, VF cardiac arrest s/p ICD, CAD, severe aortic regurgitation s/p AVR 10/2018 and aortic root replacement with CABGx1, aortic dilation, hypertension, hypothyroidism, type 2 diabetes, multiple myeloma, CKD IIIb-IV, migraine  PAIN:  Are you having pain? Yes: NPRS scale: did not rate/10 Pain location: R knee Pain description: tight, stiff  Aggravating factors: reports this is chronic; worse in AM Relieving factors: exercises in bed   PRECAUTIONS: Fall and ICD/Pacemaker  RED FLAGS: None   WEIGHT BEARING RESTRICTIONS: No  FALLS: Has patient fallen in last 6 months? No  LIVING ENVIRONMENT: Lives with: lives with their spouse; caregiver for his spouse and requires ability to transfer her Lives in: House/apartment Stairs:  4 steps to enter; 3 story home Has following equipment at home: Single point cane, Shower bench, and raised commode  PLOF: Independent; caregiver for wife; working part time as Ophthalmologist- requires sitting, standing, walking   PATIENT GOALS: improve R LE strength  OBJECTIVE:  Note: Objective measures were completed at Evaluation unless otherwise noted.  DIAGNOSTIC FINDINGS: none recent  COGNITION: Overall cognitive status: Within functional limits for tasks assessed   SENSATION: Intact in B LEs to light touch  COORDINATION: Alternating pronation/supination: slightly slowed Alternating toe tap: B WNL Finger to nose: B WNL   POSTURE: rounded shoulders and forward head   *PALPATION: no TTP  in B calf; slightly more soft tissue restriction on L  LOWER EXTREMITY ROM:     Active  Right Eval Left Eval  Hip flexion    Hip extension    Hip abduction    Hip adduction    Hip internal rotation    Hip external rotation    Knee flexion    Knee extension    Ankle dorsiflexion 14 *c/o pain in calf  10  Ankle plantarflexion    Ankle inversion    Ankle eversion     (Blank rows = not tested)  LOWER EXTREMITY MMT:    MMT Right Eval Left Eval  Hip flexion 4 4+  Hip extension    Hip abduction 4 4  Hip adduction 4 4  Hip internal rotation    Hip external rotation    Knee flexion 4 4+  Knee extension 4 4+  Ankle dorsiflexion 4+ 4+  Ankle plantarflexion 4+ 4+  Ankle inversion    Ankle eversion    (Blank rows = not tested)   GAIT: Gait pattern: Significant R-ward trunk lean with gait; slightly slowed gait speed  Assistive device utilized: None Level of assistance: Modified independence   FUNCTIONAL TESTS:    TODAY'S TREATMENT:                                                                                                                              DATE: 09-03-23  Aquatic PT at [facility] - pool temp 90 degrees  Patient seen for aquatic therapy today.  Treatment took place in water 3.6 - 4.5 feet deep depending upon activity.  Pt entered and exited the pool via step negotiation with use of hand rails with step by step sequence with supervision.  Warm up; pt performed water walking forwards, backwards and sideways 18' x 2 reps each direction - bar bells held in each hand for assist with balance  Stretching - pt performed runner's stretch RLE and LLE 20 sec x 1 rep each leg for hamstring & gastroc stretch Gastroc stretch with bil. Forefeet on pool wall, heels on floor - 20 sec hold x 1 rep each leg  Pt performed marching in place 10 reps - with contralateral UE movement with use of bar bells; marching forwards/backwards 18' x 2 reps with use of bar bells - cues to  perform slowly for improved SLS on each leg  Standing - RLE and LLE - hip flexion with knee extended 10 reps each leg; hip abduction 10 reps each leg: hip extension 5 reps each leg due to c/o fatigue in legs - pt held barbells in each hand for assist with balance  Squats x 10 reps bil. LE's; progressed to performing sidestepping with squats 18' x 2 reps with use of barbells for assist  with support and balance  Performed hip flexion/extension and abduction/adduction with use of aquatic cuff for increased resistance with eccentric contraction - 10 reps each direction each leg  Pt stood on LLE - made circles CW 5 reps and then CCW RLE 5 reps for improved SLS on LLE; stood on RLE - made circles CW and CCW with LLE 5 reps each direction for improved SLS on RLE  Pt amb. 18' x 2 reps with use of barbells at end of session with supervision with cues to walk fast; performed "jogging" 18' x 2 reps for balance and coordination bil. LE's  Pt requires buoyancy of water for support for reduced fall risk as pt able to tolerate increased standing and ambulation in water compared to that on land.  Balance exercises able to be safely performed in aquatic environment with buoyancy for support for decreased fall risk compared to land based exercises.  Viscosity of water is needed for resistance for strengthening and current of water provides perturbations for challenge for balance training.     PATIENT EDUCATION: Education details: prognosis, POC, HEP, answered pt's questions on leg press machine, edu on aquatic therapy as pt requests this Person educated: Patient Education method: Explanation, Demonstration, Tactile cues, Verbal cues, and Handouts Education comprehension: verbalized understanding and returned demonstration  HOME EXERCISE PROGRAM: Access Code: [email or web address] URL: [email or web address] Date: 08/13/2023 Prepared by: [facility] - Outpatient  Rehab - [facility]  Exercises - Standing  Gastroc Stretch at Counter  - 1 x daily - 5 x weekly - 2 sets - 30 sec hold - Seated Hamstring Stretch  - 1 x daily - 5 x weekly - 2 sets - 30 sec hold - Standing Hip Abduction with Counter Support  - 1 x daily - 5 x weekly - 2 sets - 10 reps - Standing Hip Extension with Counter Support  - 1 x daily - 5 x weekly - 2 sets - 10 reps - Mini Squat with Counter Support  - 1 x daily - 5 x weekly - 2 sets - 10 reps  GOALS: Goals reviewed with patient? Yes  SHORT TERM GOALS: Target date: 09/03/2023  Patient to be independent with initial HEP. Baseline: HEP initiated Goal status: INITIAL    LONG TERM GOALS: Target date: +09/24/2023  Patient to be independent with advanced HEP. Baseline: Not yet initiated  Goal status: INITIAL  Patient to demonstrate B LE strength >/=4+/5.  Baseline: See above Goal status: INITIAL  Patient to demonstrate L ankle dorsiflexion AROM to at least 14 deg in order to improve efficiency of gait.   Baseline: 10 deg Goal status: INITIAL  Patient to demonstrate safe stair navigation with alternating reciprocal pattern and no UE support.  Baseline: alternating reciprocal pattern with 1-2 UE support required  Goal status: INITIAL  Patient to score at least 20/24 on DGI in order to decrease risk of falls.  Baseline: 17 Goal status: INITIAL  Patient to demonstrate 25% improvement in R-ward trunk lean with gait.  Baseline: significant R lean Goal status: INITIAL  ASSESSMENT:  CLINICAL IMPRESSION: Aquatic PT session focused on bil. LE strengthening and stretching.  Pt reported tingling in bil. Quads after performing standing hip exercises with use of aquatic cuff for strengthening of eccentric contraction.  Pt reported fatigue at end of session but tolerated aquatic exercises well.  Pt able to perform slow modified jog 18' x 2 reps across width of pool with use of bar bells for support for improved coordination, balance and  strengthening.  Cont with POC.     OBJECTIVE IMPAIRMENTS: Abnormal gait, decreased balance, difficulty walking, decreased ROM, decreased strength, impaired flexibility, impaired sensation, postural dysfunction, and pain.   ACTIVITY LIMITATIONS: carrying, lifting, bending, standing, squatting, stairs, transfers, bathing, toileting, dressing, locomotion level, and caring for others  PARTICIPATION LIMITATIONS: meal prep, cleaning, laundry, shopping, community activity, occupation, yard work, and church  PERSONAL FACTORS: Age, Past/current experiences, Time since onset of injury/illness/exacerbation, and 3+ comorbidities: HFrEF, atrial fibrillation, VF cardiac arrest s/p ICD, CAD, severe aortic regurgitation s/p AVR 10/2018 and aortic root replacement with CABGx1, aortic dilation, hypertension, hypothyroidism, type 2 diabetes, multiple myeloma, CKD IIIb-IV, migraine  are also affecting patient's functional outcome.   REHAB POTENTIAL: Good  CLINICAL DECISION MAKING: Evolving/moderate complexity  EVALUATION COMPLEXITY: Moderate  PLAN:  PT FREQUENCY: 2x/week  PT DURATION: 6 weeks  PLANNED INTERVENTIONS: 97164- PT Re-evaluation, 97110-Therapeutic exercises, 97530- Therapeutic activity, 97112- Neuromuscular re-education, 97535- Self Care, 16109- Manual therapy, [phone]- Gait training, [phone]- Canalith repositioning, [phone]- Aquatic Therapy, Patient/Family education, Balance training, Stair training, Taping, Dry Needling, Joint mobilization, Spinal mobilization, Vestibular training, DME instructions, Cryotherapy, and Moist heat   PLAN FOR NEXT SESSION:   (STG #1 due 09-03-23):  Recumbent bike for RLE flexibility; progress Bilat hip and R knee strength, stairs-step ups, step downs, step taps (consider adding to HEP), balance challenges   AQUATICS: Frequency: 1x/wk Duration: 6 wks Special Instruction: R LE strengthening, balance    Kerry Fort, PT 09/03/23 8:21 PM

## 2023-09-05 ENCOUNTER — Ambulatory Visit: Payer: Medicare Other | Admitting: Physical Therapy

## 2023-09-05 ENCOUNTER — Encounter: Payer: Self-pay | Admitting: Physical Therapy

## 2023-09-05 DIAGNOSIS — R2689 Other abnormalities of gait and mobility: Secondary | ICD-10-CM | POA: Diagnosis not present

## 2023-09-05 DIAGNOSIS — M6281 Muscle weakness (generalized): Secondary | ICD-10-CM

## 2023-09-05 DIAGNOSIS — R2681 Unsteadiness on feet: Secondary | ICD-10-CM

## 2023-09-05 NOTE — Therapy (Signed)
OUTPATIENT PHYSICAL THERAPY TREATMENT NOTE   Patient Name: Eric Tota., MD MRN: [id number] DOB:1951-09-04, 72 y.o., male Today's Date: 09/05/2023   PCP:    Dorothyann Peng, MD   REFERRING PROVIDER: Chilton Si, MD   END OF SESSION:  PT End of Session - 09/05/23 0849     Visit Number 7    Number of Visits 13    Date for PT Re-Evaluation 09/24/23    Authorization Type Medicare/[employer]    PT Start Time 0850    PT Stop Time 0930    PT Time Calculation (min) 40 min    Equipment Utilized During Treatment --    Activity Tolerance Patient tolerated treatment well    Behavior During Therapy [facility] for tasks assessed/performed                Past Medical History:  Diagnosis Date   Acute blood loss anemia    Acute encephalopathy    Acute hypoxemic respiratory failure (HCC)    Acute idiopathic gout of right ankle    Acute lumbar back pain    Acute on chronic systolic heart failure (HCC)    Acute respiratory failure (HCC)    AKI (acute kidney injury) (HCC)    Altered mental status    Aortic aneurysm without rupture (HCC) 02/09/2016   Aortic root enlargement (HCC) 02/13/2012   Aortic valve regurgitation, acquired 02/13/2012   Arrhythmia    Atrial flutter (HCC) 03/18/2012   Back pain    Cardiac arrest (HCC) 02/01/2016   CHF (congestive heart failure) (HCC)    Chronic anticoagulation 03/04/2013   Chronic combined systolic and diastolic heart failure (HCC)    Chronic kidney disease    kidney fx studies increased    Chronic lower back pain    Chronic renal insufficiency    Chronic systolic heart failure (HCC) 02/13/2012   Recent diagnosis 4 / 2013, LVEF 25% by Echo 12/2011  03/2013: Echo at [facility] Cardiology Conclusions: 1. Left ventricular ejection fraction estimated by 2D at 40-45 percent. 2. Mild concentric left ventricular hypertrophy. 3. Mild left atrial enlargement. 4. Moderate aortic valve regurgitation. 5. The aortic root at the sinus(es) of valsalva is  moderately dilated 6. Mild mitral valve regurgitation. 7.   CKD (chronic kidney disease)    CKD (chronic kidney disease), stage IV (HCC) 08/06/2013   Creatinine 2.4 on 07/04/13    Claustrophobia    Colon cancer screening 03/04/2013   Debility 02/22/2016   Diabetes mellitus type 2 in nonobese [facility])    Diabetes mellitus type 2 in obese    Dysrhythmia    "palpitations"   Encounter for central line placement    Exertional dyspnea 01/2012   Femoral nerve injury 02/22/2016   Femoral neuropathy    Goals of care, counseling/discussion 05/05/2020   HCAP (healthcare-associated pneumonia)    Heart murmur    Hyperlipidemia 02/13/2012   Hypertension    Hypothyroidism    Internal hemorrhoids without mention of complication 04/11/2013   Labile blood pressure    Left bundle branch block 02/13/2012   Leg weakness, bilateral    Long term (current) use of anticoagulants 08/06/2013   Eliquis therapy    Long term current use of amiodarone 08/07/2016   Lower extremity weakness    Migraine 02/13/2012   "opthalmic"   Multiple myeloma (HCC) 05/05/2020   Non-traumatic rhabdomyolysis    Obesity (BMI 30-39.9) 02/13/2012   Pain    Paroxysmal atrial fibrillation (HCC)    Pneumonia    Retroperitoneal  bleed    Right ankle pain    Right knee pain    Severe aortic regurgitation 02/09/2016   Special screening for malignant neoplasms, colon 04/11/2013   Thyroid activity decreased    Tibial pain    Varicose vein of leg    right   Ventricular fibrillation (HCC) 02/09/2016   Weakness of both lower extremities    Past Surgical History:  Procedure Laterality Date   AORTIC VALVE REPLACEMENT  11/20/2018   CARDIAC CATHETERIZATION N/A 02/17/2016   Procedure: Left Heart Cath and Coronary Angiography;  Surgeon: Corky Crafts, MD;  Location[facility] INVASIVE CV LAB;  Service: Cardiovascular;  Laterality: N/A;   CARDIOVERSION  03/22/2012   Procedure: CARDIOVERSION;  Surgeon: Donato Schultz, MD;  Location: [facility]  ENDOSCOPY;  Service: Cardiovascular;  Laterality: N/A;   CARDIOVERSION  04/19/2012   Procedure: CARDIOVERSION;  Surgeon: Lesleigh Noe, MD;  Location: [facility] OR;  Service: Cardiovascular;  Laterality: N/A;   COLONOSCOPY N/A 04/11/2013   Procedure: COLONOSCOPY;  Surgeon: Louis Meckel, MD;  Location: [facility] ENDOSCOPY;  Service: Endoscopy;  Laterality: N/A;   COLONOSCOPY N/A 04/11/2013   Procedure: COLONOSCOPY;  Surgeon: Louis Meckel, MD;  Location: [facility] ENDOSCOPY;  Service: Endoscopy;  Laterality: N/A;   EP IMPLANTABLE DEVICE N/A 02/17/2016   Procedure: BiV ICD Insertion CRT-D;  Surgeon: Marinus Maw, MD;  Location: [facility] INVASIVE CV LAB;  Service: Cardiovascular;  Laterality: N/A;   FINGER SURGERY  2012   "4th digit right hand; thumb on left hand"   RADIOLOGY WITH ANESTHESIA N/A 02/11/2016   Procedure: MRI OF THE BRAIN WITHOUT CONTRAST, LUMBAR WITHOUT CONTRAST;  Surgeon: Medication Radiologist, MD;  Location: [facility] OR;  Service: Radiology;  Laterality: N/A;  DR. WOOD/MRI   RIGHT/LEFT HEART CATH AND CORONARY ANGIOGRAPHY N/A 07/26/2018   Procedure: RIGHT/LEFT HEART CATH AND CORONARY ANGIOGRAPHY;  Surgeon: Lyn Records, MD;  Location: [facility] INVASIVE CV LAB;  Service: Cardiovascular;  Laterality: N/A;   Skin melanocytoma excision  2012   "above left clavicle"   STERNAL INCISION RECLOSURE  11/2018   STERNAL WIRE REMOVAL  11/2018   STERNAL WOUND DEBRIDEMENT  11/2018   TEE WITHOUT CARDIOVERSION  03/22/2012   Procedure: TRANSESOPHAGEAL ECHOCARDIOGRAM (TEE);  Surgeon: Donato Schultz, MD;  Location: [facility] ENDOSCOPY;  Service: Cardiovascular;  Laterality: N/A;   TEE WITHOUT CARDIOVERSION N/A 02/08/2016   Procedure: TRANSESOPHAGEAL ECHOCARDIOGRAM (TEE);  Surgeon: Lewayne Bunting, MD;  Location: [facility] ENDOSCOPY;  Service: Cardiovascular;  Laterality: N/A;   Patient Active Problem List   Diagnosis Date Noted   Acquired thrombophilia (HCC) 05/14/2023   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   Goals of care,  counseling/discussion 05/05/2020   Multiple myeloma (HCC) 05/05/2020   S/P flap graft 01/07/2019   Coronary artery disease 01/07/2019   Long term (current) use of antibiotics 01/03/2019   Essential hypertension 12/27/2018   Postoperative infection of wound of sternum 12/27/2018   Decreased strength, endurance, and mobility 12/27/2018   Cardiac LV ejection fraction 30-35% 12/27/2018   Decreased activities of daily living (ADL) 12/27/2018   Klebsiella infection 12/27/2018   S/P AVR (aortic valve replacement) 11/23/2018   S/P ascending aortic aneurysm repair 11/23/2018   NICM (nonischemic cardiomyopathy) (HCC) 11/19/2018   AICD (automatic cardioverter/defibrillator) present 11/19/2018   Other symptoms and signs involving the musculoskeletal system 05/08/2018   Long term current use of amiodarone 08/07/2016   Type 2 diabetes mellitus with stage 3b chronic kidney disease, without long-term current use of insulin (HCC)  Femoral neuropathy    Leg weakness, bilateral    Paroxysmal atrial fibrillation (HCC)    Ventricular fibrillation (HCC) 02/09/2016   Cardiac arrest (HCC) 02/01/2016   Thyroid activity decreased    Arrhythmia    CKD (chronic kidney disease), stage IV (HCC) 08/06/2013    Class: Chronic   Long term current use of anticoagulant 03/04/2013   Atrial flutter (HCC) 03/18/2012    Class: Acute   Chronic systolic heart failure (HCC) 02/13/2012    Class: Acute   Hypertension, accelerated 02/13/2012   Hyperlipidemia 02/13/2012   Left bundle branch block 02/13/2012   Obesity (BMI 30-39.9) 02/13/2012    ONSET DATE: "quite some time" reports years   REFERRING DIAG:  G62.9 (ICD-10-CM) - Neuropathy  THERAPY DIAG:  Muscle weakness (generalized)  Unsteadiness on feet  Other abnormalities of gait and mobility  Rationale for Evaluation and Treatment: Rehabilitation  SUBJECTIVE:                                                                                                                                                                                              SUBJECTIVE STATEMENT: Not really having any pain, just tingling and tightness.  She worked me out in the pool.  I feel my balance and walking is better, except when I'm tired.  Pt accompanied by: self  PERTINENT HISTORY: HFrEF, atrial fibrillation, VF cardiac arrest s/p ICD, CAD, severe aortic regurgitation s/p AVR 10/2018 and aortic root replacement with CABGx1, aortic dilation, hypertension, hypothyroidism, type 2 diabetes, multiple myeloma, CKD IIIb-IV, migraine  PAIN:  Are you having pain? Yes: NPRS scale: 3-4/10 Pain location: R thigh Pain description: tight, stiff  Aggravating factors: reports this is chronic; worse in AM Relieving factors: exercises in bed   PRECAUTIONS: Fall and ICD/Pacemaker  RED FLAGS: None   WEIGHT BEARING RESTRICTIONS: No  FALLS: Has patient fallen in last 6 months? No  LIVING ENVIRONMENT: Lives with: lives with their spouse; caregiver for his spouse and requires ability to transfer her Lives in: House/apartment Stairs:  4 steps to enter; 3 story home Has following equipment at home: Single point cane, Shower bench, and raised commode  PLOF: Independent; caregiver for wife; working part time as Clinical research associate- requires sitting, standing, walking   PATIENT GOALS: improve R LE strength  OBJECTIVE:    TODAY'S TREATMENT: 09/05/2023 Activity Comments  Seated recumbent bike, BUEs and BLEs, Level 1.5, x 5 min For dynamic stretch of BLEs and aerobic warm up;  HR 95, O2 97%  Sidestepping x 2 min red loop at ankles   Hip abd and ext 2x10 red loop ankles Cues for  posture, decreased lateral trunk sway  Monster walk forward/back x 3 laps // bars red loop ankles   Tandem gait, 3 reps forward/back in parallel bars   Tall kneeling 1x10    Step taps to 4", 8" step x 10 each RLE as stance, with LLE step taps>runner stretch position, x 10 RLE step ups 4" step, 10 reps  Light UE support  RLE as stance on 4" step with LLE step taps to floor BUE support   Pt fatigued as leaving PT session     Access Code: [email or web address] URL: [email or web address] Date: 08/22/2023 Prepared by: [facility] - Outpatient  Rehab - [facility]  Exercises - Standing Gastroc Stretch at Counter  - 1 x daily - 5 x weekly - 2 sets - 30 sec hold - Seated Hamstring Stretch  - 1 x daily - 5 x weekly - 2 sets - 30 sec hold - Standing Hip Abduction with Counter Support  - 1 x daily - 5 x weekly - 2 sets - 10 reps - Standing Hip Extension with Counter Support  - 1 x daily - 5 x weekly - 2 sets - 10 reps - Mini Squat with Counter Support  - 1 x daily - 5 x weekly - 2 sets - 10 reps - Side Stepping with Resistance at Ankles and Counter Support  - 1 x daily - 7 x weekly - 1 sets - 3-5 reps - Hip Flexor/Ankle stretching  - 1 x daily - 7 x weekly - 1 sets - 3 reps - 30 sec hold - Tall Kneeling Hip Hinge  - 1 x daily - 7 x weekly  PATIENT EDUCATION: Education details: continue current HEP, with addition of standing hip flexor stretch (gastroc stretch position) Person educated: Patient Education method: Explanation, Demonstration, and Verbal cues Education comprehension: verbalized understanding and returned demonstration  ------------------------------------------------- Note: Objective measures were completed at Evaluation unless otherwise noted.  DIAGNOSTIC FINDINGS: none recent  COGNITION: Overall cognitive status: Within functional limits for tasks assessed   SENSATION: Intact in B LEs to light touch   COORDINATION: Alternating pronation/supination: slightly slowed Alternating toe tap: B WNL Finger to nose: B WNL   POSTURE: rounded shoulders and forward head   *PALPATION: no TTP in B calf; slightly more soft tissue restriction on L  LOWER EXTREMITY ROM:     Active  Right Eval Left Eval  Hip flexion    Hip extension    Hip abduction    Hip adduction     Hip internal rotation    Hip external rotation    Knee flexion    Knee extension    Ankle dorsiflexion 14 *c/o pain in calf  10  Ankle plantarflexion    Ankle inversion    Ankle eversion     (Blank rows = not tested)  LOWER EXTREMITY MMT:    MMT Right Eval Left Eval  Hip flexion 4 4+  Hip extension    Hip abduction 4 4  Hip adduction 4 4  Hip internal rotation    Hip external rotation    Knee flexion 4 4+  Knee extension 4 4+  Ankle dorsiflexion 4+ 4+  Ankle plantarflexion 4+ 4+  Ankle inversion    Ankle eversion    (Blank rows = not tested)   GAIT: Gait pattern: Significant R-ward trunk lean with gait; slightly slowed gait speed  Assistive device utilized: None Level of assistance: Modified independence   FUNCTIONAL TESTS:    TODAY'S TREATMENT:  DATE: 08-20-23  Aquatic PT at [facility] - pool temp 90 degrees  Patient seen for aquatic therapy today.  Treatment took place in water 2.5-4 feet deep depending upon activity.  Pt entered and exited the pool via step negotiation with use of hand rails with step by step sequence with supervision.  Warm up; pt performed water walking forwards, backwards and sideways 18' x 4 reps each direction - bar bells held in each hand for assist with balance  Stretching - pt performed runner's stretch RLE and LLE 20 sec x 1 rep each leg for hamstring & gastroc stretch Gastroc stretch with bil. Forefeet on pool wall, heels on floor - 20 sec hold x 1 rep   Hamstring stretch with each foot on pool wall, knee extended - pt performed flexion/extension approx. 10 reps each leg for gentle hamstring stretching  Pt performed marching in place 10 reps - with contralateral UE movement with use of bar bells; marching forwards/backwards 18' x 2 reps with use of bar bells - cues to perform slowly for improved SLS on each  leg  Standing - RLE and LLE - hip flexion with knee extended 10 reps each leg; hip abduction 10 reps each leg: hip extension 5 reps each leg due to c/o fatigue in legs - pt held barbells in each hand for assist with balance  Squats x 10 reps in place Sit to stand from bench in 3.8' water depth  - Lt foot on step to increase weight bearing on RLE for strengthening, 10 reps; then performed same exercise LLE with RLE on step for increased weight bearing and strengthening  Seated LAQ's - 10 reps x 2 sets RLE and LLE  Tap ups 10 reps each leg to aquatic step without UE support on pool edge to improve SLS balance on each leg   Performed Rt and Lt quad stretch in standing at end of session with use of large noodle under lower leg for increased buoyancy and flotation for knee flexion for quad stretch - 20 sec hold x 1 rep RLE and LLE  Pt amb. 18' x 4 reps without use of barbells at end of session with supervision       GOALS: Goals reviewed with patient? Yes  SHORT TERM GOALS: Target date: 09/03/2023  Patient to be independent with initial HEP. Baseline: HEP initiated Goal status: MET12/07/2023    LONG TERM GOALS: Target date: +09/24/2023  Patient to be independent with advanced HEP. Baseline: Not yet initiated  Goal status: IN PROGRESS  Patient to demonstrate B LE strength >/=4+/5.  Baseline: See above Goal status: IN PROGRESS  Patient to demonstrate L ankle dorsiflexion AROM to at least 14 deg in order to improve efficiency of gait.   Baseline: 10 deg Goal status: IN PROGRESS  Patient to demonstrate safe stair navigation with alternating reciprocal pattern and no UE support.  Baseline: alternating reciprocal pattern with 1-2 UE support required  Goal status: IN PROGRESS  Patient to score at least 20/24 on DGI in order to decrease risk of falls.  Baseline: 17 Goal status: IN PROGRESS  Patient to demonstrate 25% improvement in R-ward trunk lean with gait.  Baseline:  significant R lean Goal status: IN PROGRESS  ASSESSMENT:  CLINICAL IMPRESSION: Reviewed hip hinge exercise in tall kneeling today, with pt tolerating well; he is limited in flexibility, but reports good stretch.  Utilized recumbent bike for warm up today, with pt feeling good stretch through quadriceps on bike.  With standing strengthening  exercises, pt continues to need cues for posture and form to lessen lateral trunk lean, especially when RLE is stance leg.  He has difficulty with step downs on RLE more than step ups, and he reports fatigue at end of session.  He will continue to benefit from skilled PT to progress towards LTGs for imrpoved strength, balance, gait.  OBJECTIVE IMPAIRMENTS: Abnormal gait, decreased balance, difficulty walking, decreased ROM, decreased strength, impaired flexibility, impaired sensation, postural dysfunction, and pain.   ACTIVITY LIMITATIONS: carrying, lifting, bending, standing, squatting, stairs, transfers, bathing, toileting, dressing, locomotion level, and caring for others  PARTICIPATION LIMITATIONS: meal prep, cleaning, laundry, shopping, community activity, occupation, yard work, and church  PERSONAL FACTORS: Age, Past/current experiences, Time since onset of injury/illness/exacerbation, and 3+ comorbidities: HFrEF, atrial fibrillation, VF cardiac arrest s/p ICD, CAD, severe aortic regurgitation s/p AVR 10/2018 and aortic root replacement with CABGx1, aortic dilation, hypertension, hypothyroidism, type 2 diabetes, multiple myeloma, CKD IIIb-IV, migraine  are also affecting patient's functional outcome.   REHAB POTENTIAL: Good  CLINICAL DECISION MAKING: Evolving/moderate complexity  EVALUATION COMPLEXITY: Moderate  PLAN:  PT FREQUENCY: 2x/week  PT DURATION: 6 weeks  PLANNED INTERVENTIONS: 97164- PT Re-evaluation, 97110-Therapeutic exercises, 97530- Therapeutic activity, 97112- Neuromuscular re-education, 97535- Self Care, 16109- Manual therapy, [phone]-  Gait training, [phone]- Canalith repositioning, [phone]- Aquatic Therapy, Patient/Family education, Balance training, Stair training, Taping, Dry Needling, Joint mobilization, Spinal mobilization, Vestibular training, DME instructions, Cryotherapy, and Moist heat  PLAN FOR NEXT SESSION: Warm up with recumbent bike for RLE flexibility; progress Bilat hip and R knee strength, stairs-step ups, step downs, step taps (consider adding to HEP), balance challenges  AQUATICS: Frequency: 1x/wk Duration: 6 wks Special Instruction: R LE strengthening, balance    Lonia Blood, PT 09/05/23 6:14 PM Phone: [phone] Fax: [phone]  [facility] Rehab at [facility] [address] [place], [place] [address] Phone # [phone] Fax # [phone]

## 2023-09-10 ENCOUNTER — Encounter: Payer: Self-pay | Admitting: Physical Therapy

## 2023-09-10 ENCOUNTER — Ambulatory Visit: Payer: Medicare Other | Admitting: Physical Therapy

## 2023-09-10 DIAGNOSIS — R2681 Unsteadiness on feet: Secondary | ICD-10-CM

## 2023-09-10 DIAGNOSIS — M6281 Muscle weakness (generalized): Secondary | ICD-10-CM | POA: Diagnosis not present

## 2023-09-10 DIAGNOSIS — R2689 Other abnormalities of gait and mobility: Secondary | ICD-10-CM | POA: Diagnosis not present

## 2023-09-10 DIAGNOSIS — E1169 Type 2 diabetes mellitus with other specified complication: Secondary | ICD-10-CM | POA: Insufficient documentation

## 2023-09-10 DIAGNOSIS — N400 Enlarged prostate without lower urinary tract symptoms: Secondary | ICD-10-CM | POA: Insufficient documentation

## 2023-09-10 NOTE — Therapy (Signed)
OUTPATIENT PHYSICAL THERAPY/AQUATIC THERAPY TREATMENT NOTE   Patient Name: Eric Gorelik., MD MRN: [id number] DOB:23-Mar-1951, 72 y.o., male Today's Date: 09/10/2023   PCP:    Dorothyann Peng, MD   REFERRING PROVIDER: Chilton Si, MD   END OF SESSION:  PT End of Session - 09/10/23 1957     Visit Number 8    Number of Visits 13    Date for PT Re-Evaluation 09/24/23    Authorization Type Medicare/[employer]    PT Start Time 1545   pt arrived 35" late for appt   PT Stop Time 1625    PT Time Calculation (min) 40 min    Equipment Utilized During Treatment Other (comment)   aquatic cuff, barbells   Activity Tolerance Patient tolerated treatment well    Behavior During Therapy WFL for tasks assessed/performed             Past Medical History:  Diagnosis Date   Acute blood loss anemia    Acute encephalopathy    Acute hypoxemic respiratory failure (HCC)    Acute idiopathic gout of right ankle    Acute lumbar back pain    Acute on chronic systolic heart failure (HCC)    Acute respiratory failure (HCC)    AKI (acute kidney injury) (HCC)    Altered mental status    Aortic aneurysm without rupture (HCC) 02/09/2016   Aortic root enlargement (HCC) 02/13/2012   Aortic valve regurgitation, acquired 02/13/2012   Arrhythmia    Atrial flutter (HCC) 03/18/2012   Back pain    Cardiac arrest (HCC) 02/01/2016   CHF (congestive heart failure) (HCC)    Chronic anticoagulation 03/04/2013   Chronic combined systolic and diastolic heart failure (HCC)    Chronic kidney disease    kidney fx studies increased    Chronic lower back pain    Chronic renal insufficiency    Chronic systolic heart failure (HCC) 02/13/2012   Recent diagnosis 4 / 2013, LVEF 25% by Echo 12/2011  03/2013: Echo at [facility] Conclusions: 1. Left ventricular ejection fraction estimated by 2D at 40-45 percent. 2. Mild concentric left ventricular hypertrophy. 3. Mild left atrial enlargement. 4. Moderate aortic  valve regurgitation. 5. The aortic root at the sinus(es) of valsalva is moderately dilated 6. Mild mitral valve regurgitation. 7.   CKD (chronic kidney disease)    CKD (chronic kidney disease), stage IV (HCC) 08/06/2013   Creatinine 2.4 on 07/04/13    Claustrophobia    Colon cancer screening 03/04/2013   Debility 02/22/2016   Diabetes mellitus type 2 in nonobese [facility])    Diabetes mellitus type 2 in obese    Dysrhythmia    "palpitations"   Encounter for central line placement    Exertional dyspnea 01/2012   Femoral nerve injury 02/22/2016   Femoral neuropathy    Goals of care, counseling/discussion 05/05/2020   HCAP (healthcare-associated pneumonia)    Heart murmur    Hyperlipidemia 02/13/2012   Hypertension    Hypothyroidism    Internal hemorrhoids without mention of complication 04/11/2013   Labile blood pressure    Left bundle branch block 02/13/2012   Leg weakness, bilateral    Long term (current) use of anticoagulants 08/06/2013   Eliquis therapy    Long term current use of amiodarone 08/07/2016   Lower extremity weakness    Migraine 02/13/2012   "opthalmic"   Multiple myeloma (HCC) 05/05/2020   Non-traumatic rhabdomyolysis    Obesity (BMI 30-39.9) 02/13/2012   Pain    Paroxysmal atrial  fibrillation (HCC)    Pneumonia    Retroperitoneal bleed    Right ankle pain    Right knee pain    Severe aortic regurgitation 02/09/2016   Special screening for malignant neoplasms, colon 04/11/2013   Thyroid activity decreased    Tibial pain    Varicose vein of leg    right   Ventricular fibrillation (HCC) 02/09/2016   Weakness of both lower extremities    Past Surgical History:  Procedure Laterality Date   AORTIC VALVE REPLACEMENT  11/20/2018   CARDIAC CATHETERIZATION N/A 02/17/2016   Procedure: Left Heart Cath and Coronary Angiography;  Surgeon: Corky Crafts, MD;  Location: [facility] INVASIVE CV LAB;  Service: Cardiovascular;  Laterality: N/A;   CARDIOVERSION  03/22/2012    Procedure: CARDIOVERSION;  Surgeon: Donato Schultz, MD;  Location: [facility] ENDOSCOPY;  Service: Cardiovascular;  Laterality: N/A;   CARDIOVERSION  04/19/2012   Procedure: CARDIOVERSION;  Surgeon: Lesleigh Noe, MD;  Location: [facility] OR;  Service: Cardiovascular;  Laterality: N/A;   COLONOSCOPY N/A 04/11/2013   Procedure: COLONOSCOPY;  Surgeon: Louis Meckel, MD;  Location: [facility] ENDOSCOPY;  Service: Endoscopy;  Laterality: N/A;   COLONOSCOPY N/A 04/11/2013   Procedure: COLONOSCOPY;  Surgeon: Louis Meckel, MD;  Location: [facility] ENDOSCOPY;  Service: Endoscopy;  Laterality: N/A;   EP IMPLANTABLE DEVICE N/A 02/17/2016   Procedure: BiV ICD Insertion CRT-D;  Surgeon: Marinus Maw, MD;  Location: [facility] INVASIVE CV LAB;  Service: Cardiovascular;  Laterality: N/A;   FINGER SURGERY  2012   "4th digit right hand; thumb on left hand"   RADIOLOGY WITH ANESTHESIA N/A 02/11/2016   Procedure: MRI OF THE BRAIN WITHOUT CONTRAST, LUMBAR WITHOUT CONTRAST;  Surgeon: Medication Radiologist, MD;  Location: [facility] OR;  Service: Radiology;  Laterality: N/A;  DR. WOOD/MRI   RIGHT/LEFT HEART CATH AND CORONARY ANGIOGRAPHY N/A 07/26/2018   Procedure: RIGHT/LEFT HEART CATH AND CORONARY ANGIOGRAPHY;  Surgeon: Lyn Records, MD;  Location: [facility] INVASIVE CV LAB;  Service: Cardiovascular;  Laterality: N/A;   Skin melanocytoma excision  2012   "above left clavicle"   STERNAL INCISION RECLOSURE  11/2018   STERNAL WIRE REMOVAL  11/2018   STERNAL WOUND DEBRIDEMENT  11/2018   TEE WITHOUT CARDIOVERSION  03/22/2012   Procedure: TRANSESOPHAGEAL ECHOCARDIOGRAM (TEE);  Surgeon: Donato Schultz, MD;  Location: [facility] ENDOSCOPY;  Service: Cardiovascular;  Laterality: N/A;   TEE WITHOUT CARDIOVERSION N/A 02/08/2016   Procedure: TRANSESOPHAGEAL ECHOCARDIOGRAM (TEE);  Surgeon: Lewayne Bunting, MD;  Location: [facility] ENDOSCOPY;  Service: Cardiovascular;  Laterality: N/A;   Patient Active Problem List   Diagnosis Date Noted   Enlarged prostate 09/10/2023   Dyslipidemia due  to type 2 diabetes mellitus (HCC) 09/10/2023   Acquired thrombophilia (HCC) 05/14/2023   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   Goals of care, counseling/discussion 05/05/2020   Multiple myeloma (HCC) 05/05/2020   S/P flap graft 01/07/2019   Coronary artery disease 01/07/2019   Long term (current) use of antibiotics 01/03/2019   Essential hypertension 12/27/2018   Postoperative infection of wound of sternum 12/27/2018   Decreased strength, endurance, and mobility 12/27/2018   Cardiac LV ejection fraction 30-35% 12/27/2018   Decreased activities of daily living (ADL) 12/27/2018   Klebsiella infection 12/27/2018   S/P AVR (aortic valve replacement) 11/23/2018   S/P ascending aortic aneurysm repair 11/23/2018   NICM (nonischemic cardiomyopathy) (HCC) 11/19/2018   AICD (automatic cardioverter/defibrillator) present 11/19/2018   Other symptoms and signs involving the musculoskeletal system 05/08/2018   Long  term current use of amiodarone 08/07/2016   Type 2 diabetes mellitus with stage 3b chronic kidney disease, without long-term current use of insulin (HCC)    Femoral neuropathy    Leg weakness, bilateral    Paroxysmal atrial fibrillation (HCC)    Ventricular fibrillation (HCC) 02/09/2016   Cardiac arrest (HCC) 02/01/2016   Thyroid activity decreased    Arrhythmia    CKD (chronic kidney disease), stage IV (HCC) 08/06/2013    Class: Chronic   Long term current use of anticoagulant 03/04/2013   Atrial flutter (HCC) 03/18/2012    Class: Acute   Chronic systolic heart failure (HCC) 02/13/2012    Class: Acute   Hypertension, accelerated 02/13/2012   Hyperlipidemia 02/13/2012   Left bundle branch block 02/13/2012   Obesity (BMI 30-39.9) 02/13/2012    ONSET DATE: "quite some time" reports years   REFERRING DIAG:  G62.9 (ICD-10-CM) - Neuropathy  THERAPY DIAG:  Muscle weakness (generalized)  Unsteadiness on feet  Other abnormalities of gait and mobility  Rationale  for Evaluation and Treatment: Rehabilitation  SUBJECTIVE:                                                                                                                                                                                             SUBJECTIVE STATEMENT: Pt arrives 15" late for aquatic PT appt - apologizes for running late.  Pt states he was a little sore after aquatic PT session last week    Pt accompanied by: self  PERTINENT HISTORY: HFrEF, atrial fibrillation, VF cardiac arrest s/p ICD, CAD, severe aortic regurgitation s/p AVR 10/2018 and aortic root replacement with CABGx1, aortic dilation, hypertension, hypothyroidism, type 2 diabetes, multiple myeloma, CKD IIIb-IV, migraine  PAIN:  Are you having pain? Yes: NPRS scale: did not rate/10 Pain location: R knee Pain description: tight, stiff  Aggravating factors: reports this is chronic; worse in AM Relieving factors: exercises in bed   PRECAUTIONS: Fall and ICD/Pacemaker  RED FLAGS: None   WEIGHT BEARING RESTRICTIONS: No  FALLS: Has patient fallen in last 6 months? No  LIVING ENVIRONMENT: Lives with: lives with their spouse; caregiver for his spouse and requires ability to transfer her Lives in: House/apartment Stairs:  4 steps to enter; 3 story home Has following equipment at home: Single point cane, Shower bench, and raised commode  PLOF: Independent; caregiver for wife; working part time as Ophthalmologist- requires sitting, standing, walking   PATIENT GOALS: improve R LE strength  OBJECTIVE:  Note: Objective measures were completed at Evaluation unless otherwise noted.  DIAGNOSTIC FINDINGS: none recent  COGNITION: Overall cognitive status: Within functional limits for tasks  assessed   SENSATION: Intact in B LEs to light touch   COORDINATION: Alternating pronation/supination: slightly slowed Alternating toe tap: B WNL Finger to nose: B WNL   POSTURE: rounded shoulders and forward  head   *PALPATION: no TTP in B calf; slightly more soft tissue restriction on L  LOWER EXTREMITY ROM:     Active  Right Eval Left Eval  Hip flexion    Hip extension    Hip abduction    Hip adduction    Hip internal rotation    Hip external rotation    Knee flexion    Knee extension    Ankle dorsiflexion 14 *c/o pain in calf  10  Ankle plantarflexion    Ankle inversion    Ankle eversion     (Blank rows = not tested)  LOWER EXTREMITY MMT:    MMT Right Eval Left Eval  Hip flexion 4 4+  Hip extension    Hip abduction 4 4  Hip adduction 4 4  Hip internal rotation    Hip external rotation    Knee flexion 4 4+  Knee extension 4 4+  Ankle dorsiflexion 4+ 4+  Ankle plantarflexion 4+ 4+  Ankle inversion    Ankle eversion    (Blank rows = not tested)   GAIT: Gait pattern: Significant R-ward trunk lean with gait; slightly slowed gait speed  Assistive device utilized: None Level of assistance: Modified independence   FUNCTIONAL TESTS:    TODAY'S TREATMENT:                                                                                                                              DATE: 09-10-23  Aquatic PT at [facility] - pool temp 92 degrees  Patient seen for aquatic therapy today.  Treatment took place in water 3.6 - 4.5 feet deep depending upon activity.  Pt entered and exited the pool via step negotiation with use of hand rails with step by step sequence with supervision.  Warm up; pt performed water walking forwards, backwards and sideways 18' x 2 reps each direction   Stretching - pt performed runner's stretch RLE and LLE 20 sec x 1 rep each leg for hamstring & gastroc stretch; performed this stretch at start and again at end of session (1 rep each) Gastroc stretch with bil. Forefeet on pool wall, heels on floor - 20 sec hold x 1 rep each leg  Pt performed marching in place 10 reps - with contralateral UE movement with use of bar bells; marching  forwards/backwards 18' x 2 reps with use of bar bells - cues to perform slowly for improved SLS on each leg  Squats x 10 reps bil. LE's; unilateral squats 10 reps each leg with bil. UE support on pool edge  sidestepping with squats 18' x 2 reps - no barbells used   Performed hip flexion/extension and abduction/adduction with use of aquatic cuff for increased resistance with eccentric contraction; 1/2  march with use of aquatic cuff 10 reps each exercise each leg   Pt stood on LLE - made circles CW 10 reps and then CCW RLE 10 reps for improved SLS on LLE; stood on RLE - made circles CW and CCW with LLE  reps each direction for improved SLS on RLE - no increased resistance used   performed "jogging" 18' x 1 rep for balance and coordination bil. LE's  Pt requires buoyancy of water for support for reduced fall risk as pt able to tolerate increased standing and ambulation in water compared to that on land.  Balance exercises able to be safely performed in aquatic environment with buoyancy for support for decreased fall risk compared to land based exercises.  Viscosity of water is needed for resistance for strengthening and current of water provides perturbations for challenge for balance training.     PATIENT EDUCATION: Education details: prognosis, POC, HEP, answered pt's questions on leg press machine, edu on aquatic therapy as pt requests this Person educated: Patient Education method: Explanation, Demonstration, Tactile cues, Verbal cues, and Handouts Education comprehension: verbalized understanding and returned demonstration  HOME EXERCISE PROGRAM: Access Code: [email or web address] URL: [email or web address] Date: 08/13/2023 Prepared by: [facility] - Outpatient  Rehab - [facility]  Exercises - Standing Gastroc Stretch at Counter  - 1 x daily - 5 x weekly - 2 sets - 30 sec hold - Seated Hamstring Stretch  - 1 x daily - 5 x weekly - 2 sets - 30 sec hold - Standing Hip Abduction  with Counter Support  - 1 x daily - 5 x weekly - 2 sets - 10 reps - Standing Hip Extension with Counter Support  - 1 x daily - 5 x weekly - 2 sets - 10 reps - Mini Squat with Counter Support  - 1 x daily - 5 x weekly - 2 sets - 10 reps  GOALS: Goals reviewed with patient? Yes  SHORT TERM GOALS: Target date: 09/03/2023  Patient to be independent with initial HEP. Baseline: HEP initiated Goal status: MET12/07/2023    LONG TERM GOALS: Target date: +09/24/2023  Patient to be independent with advanced HEP. Baseline: Not yet initiated  Goal status: IN PROGRESS  Patient to demonstrate B LE strength >/=4+/5.  Baseline: See above Goal status: IN PROGRESS  Patient to demonstrate L ankle dorsiflexion AROM to at least 14 deg in order to improve efficiency of gait.   Baseline: 10 deg Goal status: IN PROGRESS  Patient to demonstrate safe stair navigation with alternating reciprocal pattern and no UE support.  Baseline: alternating reciprocal pattern with 1-2 UE support required  Goal status: IN PROGRESS  Patient to score at least 20/24 on DGI in order to decrease risk of falls.  Baseline: 17 Goal status: IN PROGRESS  Patient to demonstrate 25% improvement in R-ward trunk lean with gait.  Baseline: significant R lean Goal status: IN PROGRESS    ASSESSMENT:  CLINICAL IMPRESSION: Aquatic PT session focused on bil. LE strengthening and stretching.  Pt reported tingling in LE's, >RLE than LLE with closed chain exercises for strengthening.  Pt needed min. UE support on pool edge for safety and assist with balance recovery with SLS activities on each leg.  Cont with POC.    OBJECTIVE IMPAIRMENTS: Abnormal gait, decreased balance, difficulty walking, decreased ROM, decreased strength, impaired flexibility, impaired sensation, postural dysfunction, and pain.   ACTIVITY LIMITATIONS: carrying, lifting, bending, standing, squatting, stairs, transfers, bathing, toileting, dressing, locomotion  level, and caring for others  PARTICIPATION LIMITATIONS: meal prep, cleaning, laundry, shopping, community activity, occupation, yard work, and church  PERSONAL FACTORS: Age, Past/current experiences, Time since onset of injury/illness/exacerbation, and 3+ comorbidities: HFrEF, atrial fibrillation, VF cardiac arrest s/p ICD, CAD, severe aortic regurgitation s/p AVR 10/2018 and aortic root replacement with CABGx1, aortic dilation, hypertension, hypothyroidism, type 2 diabetes, multiple myeloma, CKD IIIb-IV, migraine  are also affecting patient's functional outcome.   REHAB POTENTIAL: Good  CLINICAL DECISION MAKING: Evolving/moderate complexity  EVALUATION COMPLEXITY: Moderate  PLAN:  PT FREQUENCY: 2x/week  PT DURATION: 6 weeks  PLANNED INTERVENTIONS: 97164- PT Re-evaluation, 97110-Therapeutic exercises, 97530- Therapeutic activity, 97112- Neuromuscular re-education, 97535- Self Care, [id number]- Manual therapy, [phone]- Gait training, [phone]- Canalith repositioning, [phone]- Aquatic Therapy, Patient/Family education, Balance training, Stair training, Taping, Dry Needling, Joint mobilization, Spinal mobilization, Vestibular training, DME instructions, Cryotherapy, and Moist heat   PLAN FOR NEXT SESSION:   (Please do progress note next land visit (#9) - as next aquatic PT appt will be visit 10 -thank you)  Warm up with recumbent bike for RLE flexibility; progress Bilat hip and R knee strength, stairs-step ups, step downs, step taps (consider adding to HEP), balance challenges   AQUATICS: Frequency: 1x/wk Duration: 6 wks Special Instruction: R LE strengthening, balance    Kerry Fort, PT 09/10/23 7:59 PM

## 2023-09-10 NOTE — Progress Notes (Signed)
A1c 5.9 very good, keep up the good work of managing BS well. Kidney function is about the same from previous.  Albumin is slightly low. Are you getting enough protein in diet if no increase protein or might be due to CKD

## 2023-09-10 NOTE — Assessment & Plan Note (Signed)
Encouraged to keep BS well controlled and avoid use of NSAIDs

## 2023-09-10 NOTE — Assessment & Plan Note (Signed)
Low fat diet advised and continue current treatment with Crestor 20 mg every day.

## 2023-09-10 NOTE — Assessment & Plan Note (Signed)
On Eliquis BID d/t PAF

## 2023-09-11 ENCOUNTER — Ambulatory Visit (INDEPENDENT_AMBULATORY_CARE_PROVIDER_SITE_OTHER): Payer: Medicare Other

## 2023-09-11 DIAGNOSIS — I5042 Chronic combined systolic (congestive) and diastolic (congestive) heart failure: Secondary | ICD-10-CM

## 2023-09-11 DIAGNOSIS — I428 Other cardiomyopathies: Secondary | ICD-10-CM

## 2023-09-12 ENCOUNTER — Other Ambulatory Visit (HOSPITAL_BASED_OUTPATIENT_CLINIC_OR_DEPARTMENT_OTHER): Payer: Self-pay

## 2023-09-12 ENCOUNTER — Encounter (HOSPITAL_BASED_OUTPATIENT_CLINIC_OR_DEPARTMENT_OTHER): Payer: Self-pay | Admitting: Emergency Medicine

## 2023-09-12 ENCOUNTER — Emergency Department (HOSPITAL_BASED_OUTPATIENT_CLINIC_OR_DEPARTMENT_OTHER)
Admission: EM | Admit: 2023-09-12 | Discharge: 2023-09-12 | Disposition: A | Discharge status: Home / Self Care | Payer: Medicare Other | Attending: Emergency Medicine | Admitting: Emergency Medicine

## 2023-09-12 ENCOUNTER — Ambulatory Visit: Payer: Medicare Other | Admitting: Physical Therapy

## 2023-09-12 ENCOUNTER — Other Ambulatory Visit: Payer: Self-pay

## 2023-09-12 ENCOUNTER — Emergency Department (HOSPITAL_BASED_OUTPATIENT_CLINIC_OR_DEPARTMENT_OTHER): Payer: Medicare Other | Admitting: Radiology

## 2023-09-12 DIAGNOSIS — Z7901 Long term (current) use of anticoagulants: Secondary | ICD-10-CM | POA: Insufficient documentation

## 2023-09-12 DIAGNOSIS — I10 Essential (primary) hypertension: Secondary | ICD-10-CM | POA: Insufficient documentation

## 2023-09-12 DIAGNOSIS — Z8579 Personal history of other malignant neoplasms of lymphoid, hematopoietic and related tissues: Secondary | ICD-10-CM | POA: Insufficient documentation

## 2023-09-12 DIAGNOSIS — I2510 Atherosclerotic heart disease of native coronary artery without angina pectoris: Secondary | ICD-10-CM | POA: Diagnosis not present

## 2023-09-12 DIAGNOSIS — E119 Type 2 diabetes mellitus without complications: Secondary | ICD-10-CM | POA: Diagnosis not present

## 2023-09-12 DIAGNOSIS — I509 Heart failure, unspecified: Secondary | ICD-10-CM | POA: Insufficient documentation

## 2023-09-12 DIAGNOSIS — M7989 Other specified soft tissue disorders: Secondary | ICD-10-CM | POA: Diagnosis not present

## 2023-09-12 DIAGNOSIS — Z79899 Other long term (current) drug therapy: Secondary | ICD-10-CM | POA: Insufficient documentation

## 2023-09-12 DIAGNOSIS — M79671 Pain in right foot: Secondary | ICD-10-CM | POA: Diagnosis not present

## 2023-09-12 DIAGNOSIS — M25571 Pain in right ankle and joints of right foot: Secondary | ICD-10-CM | POA: Diagnosis not present

## 2023-09-12 LAB — CUP PACEART REMOTE DEVICE CHECK
Battery Remaining Longevity: 14 mo
Battery Remaining Percentage: 18 %
Battery Voltage: 2.75 V
Date Time Interrogation Session: 20241217020021
HighPow Impedance: 59 Ohm
HighPow Impedance: 59 Ohm
Implantable Lead Connection Status: 753985
Implantable Lead Connection Status: 753985
Implantable Lead Connection Status: 753985
Implantable Lead Implant Date: 20170525
Implantable Lead Implant Date: 20170525
Implantable Lead Implant Date: 20170525
Implantable Lead Location: 753858
Implantable Lead Location: 753859
Implantable Lead Location: 753860
Implantable Lead Model: 7122
Implantable Pulse Generator Implant Date: 20170525
Lead Channel Impedance Value: 380 Ohm
Lead Channel Impedance Value: 430 Ohm
Lead Channel Impedance Value: 580 Ohm
Lead Channel Pacing Threshold Amplitude: 0.5 V
Lead Channel Pacing Threshold Amplitude: 1.25 V
Lead Channel Pacing Threshold Pulse Width: 0.5 ms
Lead Channel Pacing Threshold Pulse Width: 0.5 ms
Lead Channel Sensing Intrinsic Amplitude: 0.2 mV
Lead Channel Sensing Intrinsic Amplitude: 12 mV
Lead Channel Setting Pacing Amplitude: 2 V
Lead Channel Setting Pacing Amplitude: 2.5 V
Lead Channel Setting Pacing Pulse Width: 0.5 ms
Lead Channel Setting Pacing Pulse Width: 0.5 ms
Lead Channel Setting Sensing Sensitivity: 0.5 mV
Pulse Gen Serial Number: 7357926
Zone Setting Status: 755011

## 2023-09-12 MED ORDER — PREDNISONE 50 MG PO TABS
50.0000 mg | ORAL_TABLET | Freq: Every day | ORAL | 0 refills | Status: DC
  Filled 2023-09-12: qty 5, 5d supply, fill #0

## 2023-09-12 NOTE — ED Triage Notes (Signed)
Pt c/o RT ankle and foot arch pain that started last night. Swelling noted. Denies injury

## 2023-09-12 NOTE — ED Notes (Signed)
ED Provider at bedside. 

## 2023-09-12 NOTE — ED Provider Notes (Signed)
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   Ankle Pain    Patient has a history of multiple myeloma, heart failure ventricular fibrillation, atrial fibrillation, diabetes, hypertension, coronary artery disease.  Patient presents to the ED with complaints of right ankle and lateral foot pain.  Patient states he had physical therapy on the 16th.  Patient states there was a good amount of exertion.  Patient then worked a full day yesterday.  He noticed that his foot and ankle started to get sore and tender.  This morning it persisted and he noticed some redness.  Patient denies any falls.  He does have a history of gout  Home Medications Prior to Admission medications   Medication Sig Start Date End Date Taking? Authorizing Provider  predniSONE (DELTASONE) 50 MG tablet Take 1 tablet (50 mg total) by mouth daily. 09/12/23  Yes Linwood Dibbles, MD  acetaminophen (TYLENOL) 500 MG tablet Take 1,000 mg by mouth every 4 (four) hours as needed for moderate pain or headache.    [provider]  allopurinol (ZYLOPRIM) 100 MG tablet Take 1 tablet (100 mg total) by mouth daily. 08/31/23   Ellender Hose, NP  amiodarone (PACERONE) 100 MG tablet Take 1 tablet (100 mg total) by mouth daily. 02/23/23   Alver Sorrow, NP  apixaban (ELIQUIS) 5 MG TABS tablet Take 1 tablet (5 mg total) by mouth 2 (two) times daily. 08/22/23   Dorothyann Peng, MD  carvedilol (COREG) 6.25 MG tablet Take 1 tablet by mouth twice a day 08/31/23   Ellender Hose, NP  Cholecalciferol (VITAMIN D) 125 MCG (5000 UT) CAPS Take by mouth daily at 6 (six) AM.    [provider]  cholecalciferol (VITAMIN D3) 25 MCG (1000 UNIT) tablet Take 5,000 Units by mouth daily.    [provider]  famciclovir (FAMVIR) 500 MG tablet Take 1  tablet (500 mg total) by mouth daily. 11/04/21   Josph Macho, MD  FARXIGA 10 MG TABS tablet TAKE 1 TABLET(10 MG) BY MOUTH DAILY BEFORE BREAKFAST 08/10/23   Chilton Si, MD  ferrous sulfate 324 (65 Fe) MG TBEC Take 1 tablet by mouth daily.    [provider]  fluticasone (FLONASE) 50 MCG/ACT nasal spray Place 2 sprays into both nostrils daily. 01/13/21   Dorothyann Peng, MD  furosemide (LASIX) 80 MG tablet Alternate one tablet (80mg ) and half tablet (40mg ) every other day. Okay to take whole tablet as needed for fluid retention, edema. 02/23/23   Alver Sorrow, NP  Hypromellose (ARTIFICIAL TEARS OP) Place 1 drop into both eyes daily as needed (dry eyes).    [provider]  levothyroxine (SYNTHROID) 75 MCG tablet Take 1 tablet (75 mcg total) by mouth daily before breakfast. 08/31/23   Ellender Hose, NP  loperamide (IMODIUM) 2 MG capsule Take 2 mg by mouth as needed for diarrhea or loose stools.    [provider]  potassium chloride 20 MEQ/15ML (10%) SOLN TAKE 15 ML BY MOUTH TWICE DAILY Patient taking differently: daily. 08/10/21   Lyn Records, MD  rosuvastatin (CRESTOR) 20 MG tablet TAKE 1 TABLET(20 MG) BY MOUTH DAILY 08/31/23   Ellender Hose, NP  Semaglutide,0.25 or 0.5MG /DOS, 2 MG/3ML SOPN Inject 0.5 into the skin once weekly. 08/31/23  Ellender Hose, NP  vitamin B-12 (CYANOCOBALAMIN) 100 MCG tablet Take 100 mcg by mouth daily.    [provider]  vitamin C (ASCORBIC ACID) 500 MG tablet Take 500 mg by mouth daily.    [provider]  prochlorperazine (COMPAZINE) 10 MG tablet Take 1 tablet (10 mg total) by mouth every 6 (six) hours as needed (Nausea or vomiting). 01/13/22 06/02/22  Josph Macho, MD      Allergies    Lactose intolerance (gi) and Lactose    Review of Systems   Review of Systems  Physical Exam Updated Vital Signs BP (!) 138/106   Pulse 70   Temp 98.3 F (36.8 C)   Resp 16   Wt 86.6 kg   SpO2 98%   BMI 29.04 kg/m   Physical Exam Vitals and nursing note reviewed.  Constitutional:      General: He is not in acute distress.    Appearance: He is well-developed.  HENT:     Head: Normocephalic and atraumatic.     Right Ear: External ear normal.     Left Ear: External ear normal.  Eyes:     General: No scleral icterus.       Right eye: No discharge.        Left eye: No discharge.     Conjunctiva/sclera: Conjunctivae normal.  Neck:     Trachea: No tracheal deviation.  Cardiovascular:     Rate and Rhythm: Normal rate.  Pulmonary:     Effort: Pulmonary effort is normal. No respiratory distress.     Breath sounds: No stridor.  Abdominal:     General: There is no distension.  Musculoskeletal:        General: No swelling or deformity.     Cervical back: Neck supple.     Right ankle: Tenderness present over the lateral malleolus and base of 5th metatarsal. No proximal fibula tenderness.     Comments: Mild erythema noted lateral aspect of foot, no calf tenderness, no midfoot tenderness  Skin:    General: Skin is warm and dry.     Findings: No rash.  Neurological:     Mental Status: He is alert. Mental status is at baseline.     Cranial Nerves: No dysarthria or facial asymmetry.     Motor: No seizure activity.     ED Results / Procedures / Treatments   Labs (all labs ordered are listed, but only abnormal results are displayed) Labs Reviewed - No data to display  EKG None  Radiology DG Foot Complete Right Result Date: 09/12/2023 CLINICAL DATA:  Acute right foot pain without reported injury. EXAM: RIGHT FOOT COMPLETE - 3+ VIEW COMPARISON:  March 22, 2021. FINDINGS: There is no evidence of fracture or dislocation. There is no evidence of arthropathy or other focal bone abnormality. Soft tissues are unremarkable. IMPRESSION: Negative. Electronically Signed   By: Lupita Raider M.D.   On: 09/12/2023 10:59   DG Ankle Complete Right Result Date: 09/12/2023 CLINICAL DATA:  Acute right ankle pain  and swelling without reported injury. EXAM: RIGHT ANKLE - COMPLETE 3+ VIEW COMPARISON:  March 22, 2021. FINDINGS: There is no evidence of fracture, dislocation, or joint effusion. There is no evidence of arthropathy or other focal bone abnormality. Soft tissues are unremarkable. IMPRESSION: Negative. Electronically Signed   By: Lupita Raider M.D.   On: 09/12/2023 10:58    Procedures Procedures    Medications Ordered in ED Medications - No data to display  ED Course/ Medical Decision Making/ A&P Clinical Course as of 09/12/23 1125  Wed Sep 12, 2023  1115 Ankle x-rays negative for acute process [JK]    Clinical Course User Index [JK] Linwood Dibbles, MD                                 Medical Decision Making Problems Addressed: Acute right ankle pain: acute illness or injury that poses a threat to life or bodily functions  Amount and/or Complexity of Data Reviewed Radiology: ordered and independent interpretation performed.  Risk Prescription drug management.   Patient presented to the ED with complaints of ankle pain.  Patient does have history of gout.  X-rays do not show any signs of fracture.  No evidence of bony metastases.  Presentation not suggestive of infection.  I doubt septic arthritis.  Suspect symptoms are related to an acute gout flare.  Will discharge home with course of steroids.  Warning signs precautions discussed        Final Clinical Impression(s) / ED Diagnoses Final diagnoses:  Acute right ankle pain    Rx / DC Orders ED Discharge Orders          Ordered    predniSONE (DELTASONE) 50 MG tablet  Daily        09/12/23 1124              Linwood Dibbles, MD 09/12/23 1126

## 2023-09-12 NOTE — Discharge Instructions (Signed)
Take the steroids to help with a possible gout flare.  Follow-up with your doctor to be rechecked if the symptoms do not resolve in the next week.  Return to the ER for fevers chills or other concerning symptoms

## 2023-09-17 ENCOUNTER — Encounter: Payer: Self-pay | Admitting: Physical Therapy

## 2023-09-17 ENCOUNTER — Ambulatory Visit: Payer: Medicare Other | Admitting: Physical Therapy

## 2023-09-17 DIAGNOSIS — M6281 Muscle weakness (generalized): Secondary | ICD-10-CM

## 2023-09-17 DIAGNOSIS — R2681 Unsteadiness on feet: Secondary | ICD-10-CM

## 2023-09-17 DIAGNOSIS — R2689 Other abnormalities of gait and mobility: Secondary | ICD-10-CM

## 2023-09-17 NOTE — Therapy (Signed)
OUTPATIENT PHYSICAL THERAPY/AQUATIC THERAPY TREATMENT NOTE   Patient Name: Eric [name]., MD MRN: [id number] DOB:12-10-1950, 72 y.o., male Today's Date: 09/17/2023   PCP:    Dorothyann Peng, MD   REFERRING PROVIDER: Chilton Si, MD   END OF SESSION:  PT End of Session - 09/17/23 2022     Visit Number 9    Number of Visits 13    Date for PT Re-Evaluation 09/24/23    Authorization Type Medicare/[employer]    PT Start Time 1535    PT Stop Time 1615    PT Time Calculation (min) 40 min    Equipment Utilized During Treatment Other (comment)   aquatic step   Activity Tolerance Patient tolerated treatment well    Behavior During Therapy WFL for tasks assessed/performed             Past Medical History:  Diagnosis Date   Acute blood loss anemia    Acute encephalopathy    Acute hypoxemic respiratory failure (HCC)    Acute idiopathic gout of right ankle    Acute lumbar back pain    Acute on chronic systolic heart failure (HCC)    Acute respiratory failure (HCC)    AKI (acute kidney injury) (HCC)    Altered mental status    Aortic aneurysm without rupture (HCC) 02/09/2016   Aortic root enlargement (HCC) 02/13/2012   Aortic valve regurgitation, acquired 02/13/2012   Arrhythmia    Atrial flutter (HCC) 03/18/2012   Back pain    Cardiac arrest (HCC) 02/01/2016   CHF (congestive heart failure) (HCC)    Chronic anticoagulation 03/04/2013   Chronic combined systolic and diastolic heart failure (HCC)    Chronic kidney disease    kidney fx studies increased    Chronic lower back pain    Chronic renal insufficiency    Chronic systolic heart failure (HCC) 02/13/2012   Recent diagnosis 4 / 2013, LVEF 25% by Echo 12/2011  03/2013: Echo at [facility] Cardiology Conclusions: 1. Left ventricular ejection fraction estimated by 2D at 40-45 percent. 2. Mild concentric left ventricular hypertrophy. 3. Mild left atrial enlargement. 4. Moderate aortic valve regurgitation. 5. The aortic root at  the sinus(es) of valsalva is moderately dilated 6. Mild mitral valve regurgitation. 7.   CKD (chronic kidney disease)    CKD (chronic kidney disease), stage IV (HCC) 08/06/2013   Creatinine 2.4 on 07/04/13    Claustrophobia    Colon cancer screening 03/04/2013   Debility 02/22/2016   Diabetes mellitus type 2 in nonobese [facility])    Diabetes mellitus type 2 in obese    Dysrhythmia    "palpitations"   Encounter for central line placement    Exertional dyspnea 01/2012   Femoral nerve injury 02/22/2016   Femoral neuropathy    Goals of care, counseling/discussion 05/05/2020   HCAP (healthcare-associated pneumonia)    Heart murmur    Hyperlipidemia 02/13/2012   Hypertension    Hypothyroidism    Internal hemorrhoids without mention of complication 04/11/2013   Labile blood pressure    Left bundle branch block 02/13/2012   Leg weakness, bilateral    Long term (current) use of anticoagulants 08/06/2013   Eliquis therapy    Long term current use of amiodarone 08/07/2016   Lower extremity weakness    Migraine 02/13/2012   "opthalmic"   Multiple myeloma (HCC) 05/05/2020   Non-traumatic rhabdomyolysis    Obesity (BMI 30-39.9) 02/13/2012   Pain    Paroxysmal atrial fibrillation (HCC)    Pneumonia  Retroperitoneal bleed    Right ankle pain    Right knee pain    Severe aortic regurgitation 02/09/2016   Special screening for malignant neoplasms, colon 04/11/2013   Thyroid activity decreased    Tibial pain    Varicose vein of leg    right   Ventricular fibrillation (HCC) 02/09/2016   Weakness of both lower extremities    Past Surgical History:  Procedure Laterality Date   AORTIC VALVE REPLACEMENT  11/20/2018   CARDIAC CATHETERIZATION N/A 02/17/2016   Procedure: Left Heart Cath and Coronary Angiography;  Surgeon: Corky Crafts, MD;  Location: [facility] INVASIVE CV LAB;  Service: Cardiovascular;  Laterality: N/A;   CARDIOVERSION  03/22/2012   Procedure: CARDIOVERSION;  Surgeon: Donato Schultz, MD;  Location: [facility] ENDOSCOPY;  Service: Cardiovascular;  Laterality: N/A;   CARDIOVERSION  04/19/2012   Procedure: CARDIOVERSION;  Surgeon: Lesleigh Noe, MD;  Location: [facility] OR;  Service: Cardiovascular;  Laterality: N/A;   COLONOSCOPY N/A 04/11/2013   Procedure: COLONOSCOPY;  Surgeon: Louis Meckel, MD;  Location: [facility] ENDOSCOPY;  Service: Endoscopy;  Laterality: N/A;   COLONOSCOPY N/A 04/11/2013   Procedure: COLONOSCOPY;  Surgeon: Louis Meckel, MD;  Location: [facility] ENDOSCOPY;  Service: Endoscopy;  Laterality: N/A;   EP IMPLANTABLE DEVICE N/A 02/17/2016   Procedure: BiV ICD Insertion CRT-D;  Surgeon: Marinus Maw, MD;  Location: [facility] INVASIVE CV LAB;  Service: Cardiovascular;  Laterality: N/A;   FINGER SURGERY  2012   "4th digit right hand; thumb on left hand"   RADIOLOGY WITH ANESTHESIA N/A 02/11/2016   Procedure: MRI OF THE BRAIN WITHOUT CONTRAST, LUMBAR WITHOUT CONTRAST;  Surgeon: Medication Radiologist, MD;  Location: [facility] OR;  Service: Radiology;  Laterality: N/A;  DR. WOOD/MRI   RIGHT/LEFT HEART CATH AND CORONARY ANGIOGRAPHY N/A 07/26/2018   Procedure: RIGHT/LEFT HEART CATH AND CORONARY ANGIOGRAPHY;  Surgeon: Lyn Records, MD;  Location: [facility] INVASIVE CV LAB;  Service: Cardiovascular;  Laterality: N/A;   Skin melanocytoma excision  2012   "above left clavicle"   STERNAL INCISION RECLOSURE  11/2018   STERNAL WIRE REMOVAL  11/2018   STERNAL WOUND DEBRIDEMENT  11/2018   TEE WITHOUT CARDIOVERSION  03/22/2012   Procedure: TRANSESOPHAGEAL ECHOCARDIOGRAM (TEE);  Surgeon: Donato Schultz, MD;  Location: [facility] ENDOSCOPY;  Service: Cardiovascular;  Laterality: N/A;   TEE WITHOUT CARDIOVERSION N/A 02/08/2016   Procedure: TRANSESOPHAGEAL ECHOCARDIOGRAM (TEE);  Surgeon: Lewayne Bunting, MD;  Location: [facility] ENDOSCOPY;  Service: Cardiovascular;  Laterality: N/A;   Patient Active Problem List   Diagnosis Date Noted   Enlarged prostate 09/10/2023   Dyslipidemia due to type 2 diabetes mellitus (HCC)  09/10/2023   Acquired thrombophilia (HCC) 05/14/2023   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   Goals of care, counseling/discussion 05/05/2020   Multiple myeloma (HCC) 05/05/2020   S/P flap graft 01/07/2019   Coronary artery disease 01/07/2019   Long term (current) use of antibiotics 01/03/2019   Essential hypertension 12/27/2018   Postoperative infection of wound of sternum 12/27/2018   Decreased strength, endurance, and mobility 12/27/2018   Cardiac LV ejection fraction 30-35% 12/27/2018   Decreased activities of daily living (ADL) 12/27/2018   Klebsiella infection 12/27/2018   S/P AVR (aortic valve replacement) 11/23/2018   S/P ascending aortic aneurysm repair 11/23/2018   NICM (nonischemic cardiomyopathy) (HCC) 11/19/2018   AICD (automatic cardioverter/defibrillator) present 11/19/2018   Other symptoms and signs involving the musculoskeletal system 05/08/2018   Long term current use of amiodarone 08/07/2016   Type  2 diabetes mellitus with stage 3b chronic kidney disease, without long-term current use of insulin (HCC)    Femoral neuropathy    Leg weakness, bilateral    Paroxysmal atrial fibrillation (HCC)    Ventricular fibrillation (HCC) 02/09/2016   Cardiac arrest (HCC) 02/01/2016   Thyroid activity decreased    Arrhythmia    CKD (chronic kidney disease), stage IV (HCC) 08/06/2013    Class: Chronic   Long term current use of anticoagulant 03/04/2013   Atrial flutter (HCC) 03/18/2012    Class: Acute   Chronic systolic heart failure (HCC) 02/13/2012    Class: Acute   Hypertension, accelerated 02/13/2012   Hyperlipidemia 02/13/2012   Left bundle branch block 02/13/2012   Obesity (BMI 30-39.9) 02/13/2012    ONSET DATE: "quite some time" reports years   REFERRING DIAG:  G62.9 (ICD-10-CM) - Neuropathy Reprot THERAPY DIAG:  Unsteadiness on feet  Muscle weakness (generalized)  Other abnormalities of gait and mobility  Rationale for Evaluation and Treatment:  Rehabilitation  SUBJECTIVE:                                                                                                                                                                                             SUBJECTIVE STATEMENT: Pt went to [facility] ED last Wednesday due to Rt ankle and foot pain - was diagnosed with gout.  Pt states he was prescribed prednisone which helped to reduce pain.  Attributes flare up to some foods he had been eating such as more red meat and collards.   Reports no pain today - just tightness in his Rt leg - says he may have overdone it some over the weekend by caring for his wife extra hours before her nurse could get there.   Pt accompanied by: self  PERTINENT HISTORY: HFrEF, atrial fibrillation, VF cardiac arrest s/p ICD, CAD, severe aortic regurgitation s/p AVR 10/2018 and aortic root replacement with CABGx1, aortic dilation, hypertension, hypothyroidism, type 2 diabetes, multiple myeloma, CKD IIIb-IV, migraine  PAIN:  Are you having pain? Yes: NPRS scale: did not rate/10 Pain location: R knee Pain description: tight, stiff  Aggravating factors: reports this is chronic; worse in AM Relieving factors: exercises in bed   PRECAUTIONS: Fall and ICD/Pacemaker  RED FLAGS: None   WEIGHT BEARING RESTRICTIONS: No  FALLS: Has patient fallen in last 6 months? No  LIVING ENVIRONMENT: Lives with: lives with their spouse; caregiver for his spouse and requires ability to transfer her Lives in: House/apartment Stairs:  4 steps to enter; 3 story home Has following equipment at home: Single point cane, Shower bench, and raised commode  PLOF:  Independent; caregiver for wife; working part time as Ophthalmologist- requires sitting, standing, walking   PATIENT GOALS: improve R LE strength  OBJECTIVE:  Note: Objective measures were completed at Evaluation unless otherwise noted.  DIAGNOSTIC FINDINGS: none recent  COGNITION: Overall cognitive status: Within  functional limits for tasks assessed   SENSATION: Intact in B LEs to light touch   COORDINATION: Alternating pronation/supination: slightly slowed Alternating toe tap: B WNL Finger to nose: B WNL   POSTURE: rounded shoulders and forward head   *PALPATION: no TTP in B calf; slightly more soft tissue restriction on L  LOWER EXTREMITY ROM:     Active  Right Eval Left Eval  Hip flexion    Hip extension    Hip abduction    Hip adduction    Hip internal rotation    Hip external rotation    Knee flexion    Knee extension    Ankle dorsiflexion 14 *c/o pain in calf  10  Ankle plantarflexion    Ankle inversion    Ankle eversion     (Blank rows = not tested)  LOWER EXTREMITY MMT:    MMT Right Eval Left Eval  Hip flexion 4 4+  Hip extension    Hip abduction 4 4  Hip adduction 4 4  Hip internal rotation    Hip external rotation    Knee flexion 4 4+  Knee extension 4 4+  Ankle dorsiflexion 4+ 4+  Ankle plantarflexion 4+ 4+  Ankle inversion    Ankle eversion    (Blank rows = not tested)   GAIT: Gait pattern: Significant R-ward trunk lean with gait; slightly slowed gait speed  Assistive device utilized: None Level of assistance: Modified independence   FUNCTIONAL TESTS:    TODAY'S TREATMENT:                                                                                                                              DATE: 09-17-23  Aquatic PT at [facility] - pool temp 90 degrees  Patient seen for aquatic therapy today.  Treatment took place in water 3.6 - 4.5 feet deep depending upon activity.  Pt entered and exited the pool via step negotiation with use of hand rails with step by step sequence with supervision.  Warm up; pt performed water walking forwards, backwards and sideways 18' x 2 reps each direction   Stretching - pt performed runner's stretch RLE and LLE 20 sec x 1 rep each leg for hamstring & gastroc stretch; performed this stretch at start and again  at end of session (1 rep each) Gastroc stretch with bil. Forefeet on pool wall, heels on floor - 20 sec hold x 1 rep each leg  Pt performed marching in place 10 reps - with contralateral UE movement:  marching forwards/backwards 18' x 2 reps - cues to perform slowly for improved SLS on each leg  Squats x 10 reps bil. LE's; (no unilateral squats performed  in today's session to minimize stress on RLE)  Sidestepping with squats 18' x 2 reps - no barbells used   Performed hip flexion/extension and abduction/adduction, 1/2 march 10 reps each exercise each leg   Pt stood on LLE - made circles CW 10 reps and then CCW RLE 10 reps for improved SLS on LLE; stood on RLE - made circles CW and CCW with LLE  reps each direction for improved SLS on RLE - no increased resistance used   Ai Chi postures - "Balancing" 10 reps with UE support on pool edge prn for balance recovery;  Enclosing - 10 reps for improved core stabilization   Pt requires buoyancy of water for support for reduced fall risk as pt able to tolerate increased standing and ambulation in water compared to that on land.  Balance exercises able to be safely performed in aquatic environment with buoyancy for support for decreased fall risk compared to land based exercises.  Viscosity of water is needed for resistance for strengthening and current of water provides perturbations for challenge for balance training.     PATIENT EDUCATION: Education details: prognosis, POC, HEP, answered pt's questions on leg press machine, edu on aquatic therapy as pt requests this Person educated: Patient Education method: Explanation, Demonstration, Tactile cues, Verbal cues, and Handouts Education comprehension: verbalized understanding and returned demonstration  HOME EXERCISE PROGRAM: Access Code: [email or web address] URL: [email or web address] Date: 08/13/2023 Prepared by: [facility] - Outpatient  Rehab - [facility]  Exercises - Standing Gastroc  Stretch at Counter  - 1 x daily - 5 x weekly - 2 sets - 30 sec hold - Seated Hamstring Stretch  - 1 x daily - 5 x weekly - 2 sets - 30 sec hold - Standing Hip Abduction with Counter Support  - 1 x daily - 5 x weekly - 2 sets - 10 reps - Standing Hip Extension with Counter Support  - 1 x daily - 5 x weekly - 2 sets - 10 reps - Mini Squat with Counter Support  - 1 x daily - 5 x weekly - 2 sets - 10 reps  GOALS: Goals reviewed with patient? Yes  SHORT TERM GOALS: Target date: 09/03/2023  Patient to be independent with initial HEP. Baseline: HEP initiated Goal status: MET12/07/2023    LONG TERM GOALS: Target date: +09/24/2023  Patient to be independent with advanced HEP. Baseline: Not yet initiated  Goal status: IN PROGRESS  Patient to demonstrate B LE strength >/=4+/5.  Baseline: See above Goal status: IN PROGRESS  Patient to demonstrate L ankle dorsiflexion AROM to at least 14 deg in order to improve efficiency of gait.   Baseline: 10 deg Goal status: IN PROGRESS  Patient to demonstrate safe stair navigation with alternating reciprocal pattern and no UE support.  Baseline: alternating reciprocal pattern with 1-2 UE support required  Goal status: IN PROGRESS  Patient to score at least 20/24 on DGI in order to decrease risk of falls.  Baseline: 17 Goal status: IN PROGRESS  Patient to demonstrate 25% improvement in R-ward trunk lean with gait.  Baseline: significant R lean Goal status: IN PROGRESS    ASSESSMENT:  CLINICAL IMPRESSION: Aquatic PT session focused on bil. LE strengthening, stretching and balance exercises to improve SLS on each leg.  Pt reported tightness and  tingling in LE's, >RLE than LLE.  No increased resistance was used in today's session to minimize stress on RLE due to flare up of gout experienced last week.  Pt tolerated aquatic  exercises well in today's session with no c/o pain in RLE.  Cont with POC.    OBJECTIVE IMPAIRMENTS: Abnormal gait,  decreased balance, difficulty walking, decreased ROM, decreased strength, impaired flexibility, impaired sensation, postural dysfunction, and pain.   ACTIVITY LIMITATIONS: carrying, lifting, bending, standing, squatting, stairs, transfers, bathing, toileting, dressing, locomotion level, and caring for others  PARTICIPATION LIMITATIONS: meal prep, cleaning, laundry, shopping, community activity, occupation, yard work, and church  PERSONAL FACTORS: Age, Past/current experiences, Time since onset of injury/illness/exacerbation, and 3+ comorbidities: HFrEF, atrial fibrillation, VF cardiac arrest s/p ICD, CAD, severe aortic regurgitation s/p AVR 10/2018 and aortic root replacement with CABGx1, aortic dilation, hypertension, hypothyroidism, type 2 diabetes, multiple myeloma, CKD IIIb-IV, migraine  are also affecting patient's functional outcome.   REHAB POTENTIAL: Good  CLINICAL DECISION MAKING: Evolving/moderate complexity  EVALUATION COMPLEXITY: Moderate  PLAN:  PT FREQUENCY: 2x/week  PT DURATION: 6 weeks  PLANNED INTERVENTIONS: 97164- PT Re-evaluation, 97110-Therapeutic exercises, 97530- Therapeutic activity, 97112- Neuromuscular re-education, 97535- Self Care, 16109- Manual therapy, [phone]- Gait training, [phone]- Canalith repositioning, [phone]- Aquatic Therapy, Patient/Family education, Balance training, Stair training, Taping, Dry Needling, Joint mobilization, Spinal mobilization, Vestibular training, DME instructions, Cryotherapy, and Moist heat   PLAN FOR NEXT SESSION:   (10th visit progress note due next land visit) Warm up with recumbent bike for RLE flexibility; progress Bilat hip and R knee strength, stairs-step ups, step downs, step taps (consider adding to HEP), balance challenges   AQUATICS: Frequency: 1x/wk Duration: 6 wks Special Instruction: R LE strengthening, balance    Kerry Fort, PT 09/17/23 8:27 PM

## 2023-09-20 ENCOUNTER — Ambulatory Visit: Payer: Medicare Other | Admitting: Physical Therapy

## 2023-09-21 DIAGNOSIS — N1832 Chronic kidney disease, stage 3b: Secondary | ICD-10-CM | POA: Diagnosis not present

## 2023-09-21 DIAGNOSIS — D631 Anemia in chronic kidney disease: Secondary | ICD-10-CM | POA: Diagnosis not present

## 2023-09-21 DIAGNOSIS — N189 Chronic kidney disease, unspecified: Secondary | ICD-10-CM | POA: Diagnosis not present

## 2023-09-21 DIAGNOSIS — I129 Hypertensive chronic kidney disease with stage 1 through stage 4 chronic kidney disease, or unspecified chronic kidney disease: Secondary | ICD-10-CM | POA: Diagnosis not present

## 2023-09-21 DIAGNOSIS — N2581 Secondary hyperparathyroidism of renal origin: Secondary | ICD-10-CM | POA: Diagnosis not present

## 2023-09-24 ENCOUNTER — Ambulatory Visit: Payer: Medicare Other | Admitting: Physical Therapy

## 2023-09-28 ENCOUNTER — Encounter: Payer: Self-pay | Admitting: Physical Therapy

## 2023-09-28 ENCOUNTER — Ambulatory Visit: Payer: Medicare Other | Attending: Cardiovascular Disease | Admitting: Physical Therapy

## 2023-09-28 DIAGNOSIS — R2689 Other abnormalities of gait and mobility: Secondary | ICD-10-CM | POA: Diagnosis not present

## 2023-09-28 DIAGNOSIS — R2681 Unsteadiness on feet: Secondary | ICD-10-CM | POA: Diagnosis not present

## 2023-09-28 DIAGNOSIS — M6281 Muscle weakness (generalized): Secondary | ICD-10-CM | POA: Diagnosis not present

## 2023-09-28 NOTE — Therapy (Signed)
 OUTPATIENT PHYSICAL THERAPY TREATMENT NOTE/PROGRESS NOTE/RECERT   Patient Name: Eric [name]., MD MRN: [id number] DOB:1951/02/10, 73 y.o., male Today's Date: 09/28/2023   PCP:    Jarold Medici, MD   REFERRING PROVIDER: Raford Riggs, MD  Progress Note Reporting Period 08/13/2023 to 09/28/2023  See note below for Objective Data and Assessment of Progress/Goals.      END OF SESSION:  PT End of Session - 09/28/23 0940     Visit Number 10    Number of Visits 22    Date for PT Re-Evaluation 11/09/23    Authorization Type Medicare/[employer]    PT Start Time 0940    PT Stop Time 1018    PT Time Calculation (min) 38 min    Equipment Utilized During Treatment Gait belt    Activity Tolerance Patient tolerated treatment well    Behavior During Therapy WFL for tasks assessed/performed                 Past Medical History:  Diagnosis Date   Acute blood loss anemia    Acute encephalopathy    Acute hypoxemic respiratory failure (HCC)    Acute idiopathic gout of right ankle    Acute lumbar back pain    Acute on chronic systolic heart failure (HCC)    Acute respiratory failure (HCC)    AKI (acute kidney injury) (HCC)    Altered mental status    Aortic aneurysm without rupture (HCC) 02/09/2016   Aortic root enlargement (HCC) 02/13/2012   Aortic valve regurgitation, acquired 02/13/2012   Arrhythmia    Atrial flutter (HCC) 03/18/2012   Back pain    Cardiac arrest (HCC) 02/01/2016   CHF (congestive heart failure) (HCC)    Chronic anticoagulation 03/04/2013   Chronic combined systolic and diastolic heart failure (HCC)    Chronic kidney disease    kidney fx studies increased    Chronic lower back pain    Chronic renal insufficiency    Chronic systolic heart failure (HCC) 02/13/2012   Recent diagnosis 4 / 2013, LVEF 25% by Echo 12/2011  03/2013: Echo at [facility] Cardiology Conclusions: 1. Left ventricular ejection fraction estimated by 2D at 40-45 percent. 2. Mild concentric  left ventricular hypertrophy. 3. Mild left atrial enlargement. 4. Moderate aortic valve regurgitation. 5. The aortic root at the sinus(es) of valsalva is moderately dilated 6. Mild mitral valve regurgitation. 7.   CKD (chronic kidney disease)    CKD (chronic kidney disease), stage IV (HCC) 08/06/2013   Creatinine 2.4 on 07/04/13    Claustrophobia    Colon cancer screening 03/04/2013   Debility 02/22/2016   Diabetes mellitus type 2 in nonobese [facility])    Diabetes mellitus type 2 in obese    Dysrhythmia    palpitations   Encounter for central line placement    Exertional dyspnea 01/2012   Femoral nerve injury 02/22/2016   Femoral neuropathy    Goals of care, counseling/discussion 05/05/2020   HCAP (healthcare-associated pneumonia)    Heart murmur    Hyperlipidemia 02/13/2012   Hypertension    Hypothyroidism    Internal hemorrhoids without mention of complication 04/11/2013   Labile blood pressure    Left bundle branch block 02/13/2012   Leg weakness, bilateral    Long term (current) use of anticoagulants 08/06/2013   Eliquis  therapy    Long term current use of amiodarone  08/07/2016   Lower extremity weakness    Migraine 02/13/2012   opthalmic   Multiple myeloma (HCC) 05/05/2020   Non-traumatic rhabdomyolysis  Obesity (BMI 30-39.9) 02/13/2012   Pain    Paroxysmal atrial fibrillation (HCC)    Pneumonia    Retroperitoneal bleed    Right ankle pain    Right knee pain    Severe aortic regurgitation 02/09/2016   Special screening for malignant neoplasms, colon 04/11/2013   Thyroid  activity decreased    Tibial pain    Varicose vein of leg    right   Ventricular fibrillation (HCC) 02/09/2016   Weakness of both lower extremities    Past Surgical History:  Procedure Laterality Date   AORTIC VALVE REPLACEMENT  11/20/2018   CARDIAC CATHETERIZATION N/A 02/17/2016   Procedure: Left Heart Cath and Coronary Angiography;  Surgeon: Candyce GORMAN Reek, MD;  Location: [facility] INVASIVE  CV LAB;  Service: Cardiovascular;  Laterality: N/A;   CARDIOVERSION  03/22/2012   Procedure: CARDIOVERSION;  Surgeon: Oneil Parchment, MD;  Location: [facility] ENDOSCOPY;  Service: Cardiovascular;  Laterality: N/A;   CARDIOVERSION  04/19/2012   Procedure: CARDIOVERSION;  Surgeon: Victory LELON Claudene DOUGLAS, MD;  Location: [facility] OR;  Service: Cardiovascular;  Laterality: N/A;   COLONOSCOPY N/A 04/11/2013   Procedure: COLONOSCOPY;  Surgeon: Lamar JONETTA Aho, MD;  Location: [facility] ENDOSCOPY;  Service: Endoscopy;  Laterality: N/A;   COLONOSCOPY N/A 04/11/2013   Procedure: COLONOSCOPY;  Surgeon: Lamar JONETTA Aho, MD;  Location: [facility] ENDOSCOPY;  Service: Endoscopy;  Laterality: N/A;   EP IMPLANTABLE DEVICE N/A 02/17/2016   Procedure: BiV ICD Insertion CRT-D;  Surgeon: Danelle LELON Birmingham, MD;  Location: [facility] INVASIVE CV LAB;  Service: Cardiovascular;  Laterality: N/A;   FINGER SURGERY  2012   4th digit right hand; thumb on left hand   RADIOLOGY WITH ANESTHESIA N/A 02/11/2016   Procedure: MRI OF THE BRAIN WITHOUT CONTRAST, LUMBAR WITHOUT CONTRAST;  Surgeon: Medication Radiologist, MD;  Location: [facility] OR;  Service: Radiology;  Laterality: N/A;  DR. WOOD/MRI   RIGHT/LEFT HEART CATH AND CORONARY ANGIOGRAPHY N/A 07/26/2018   Procedure: RIGHT/LEFT HEART CATH AND CORONARY ANGIOGRAPHY;  Surgeon: Claudene Victory LELON, MD;  Location: [facility] INVASIVE CV LAB;  Service: Cardiovascular;  Laterality: N/A;   Skin melanocytoma excision  2012   above left clavicle   STERNAL INCISION RECLOSURE  11/2018   STERNAL WIRE REMOVAL  11/2018   STERNAL WOUND DEBRIDEMENT  11/2018   TEE WITHOUT CARDIOVERSION  03/22/2012   Procedure: TRANSESOPHAGEAL ECHOCARDIOGRAM (TEE);  Surgeon: Oneil Parchment, MD;  Location: [facility] ENDOSCOPY;  Service: Cardiovascular;  Laterality: N/A;   TEE WITHOUT CARDIOVERSION N/A 02/08/2016   Procedure: TRANSESOPHAGEAL ECHOCARDIOGRAM (TEE);  Surgeon: Redell GORMAN Shallow, MD;  Location: [facility] ENDOSCOPY;  Service: Cardiovascular;  Laterality: N/A;   Patient Active  Problem List   Diagnosis Date Noted   Enlarged prostate 09/10/2023   Dyslipidemia due to type 2 diabetes mellitus (HCC) 09/10/2023   Acquired thrombophilia (HCC) 05/14/2023   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   Goals of care, counseling/discussion 05/05/2020   Multiple myeloma (HCC) 05/05/2020   S/P flap graft 01/07/2019   Coronary artery disease 01/07/2019   Long term (current) use of antibiotics 01/03/2019   Essential hypertension 12/27/2018   Postoperative infection of wound of sternum 12/27/2018   Decreased strength, endurance, and mobility 12/27/2018   Cardiac LV ejection fraction 30-35% 12/27/2018   Decreased activities of daily living (ADL) 12/27/2018   Klebsiella infection 12/27/2018   S/P AVR (aortic valve replacement) 11/23/2018   S/P ascending aortic aneurysm repair 11/23/2018   NICM (nonischemic cardiomyopathy) (HCC) 11/19/2018   AICD (automatic cardioverter/defibrillator) present 11/19/2018  Other symptoms and signs involving the musculoskeletal system 05/08/2018   Long term current use of amiodarone  08/07/2016   Type 2 diabetes mellitus with stage 3b chronic kidney disease, without long-term current use of insulin  (HCC)    Femoral neuropathy    Leg weakness, bilateral    Paroxysmal atrial fibrillation (HCC)    Ventricular fibrillation (HCC) 02/09/2016   Cardiac arrest (HCC) 02/01/2016   Thyroid  activity decreased    Arrhythmia    CKD (chronic kidney disease), stage IV (HCC) 08/06/2013    Class: Chronic   Long term current use of anticoagulant 03/04/2013   Atrial flutter (HCC) 03/18/2012    Class: Acute   Chronic systolic heart failure (HCC) 02/13/2012    Class: Acute   Hypertension, accelerated 02/13/2012   Hyperlipidemia 02/13/2012   Left bundle branch block 02/13/2012   Obesity (BMI 30-39.9) 02/13/2012    ONSET DATE: quite some time reports years   REFERRING DIAG:  G62.9 (ICD-10-CM) - Neuropathy  THERAPY DIAG:  Unsteadiness on  feet  Muscle weakness (generalized)  Other abnormalities of gait and mobility  Rationale for Evaluation and Treatment: Rehabilitation  SUBJECTIVE:                                                                                                                                                                                             SUBJECTIVE STATEMENT: Have had to do a lot of walking due to wife being in the hospital.  Did have to miss some appointments due to gout.  Thought I was doing very well and the limping was getting better until the gout came on.  Would like to continue to work towards those goals.  Pt accompanied by: self  PERTINENT HISTORY: HFrEF, atrial fibrillation, VF cardiac arrest s/p ICD, CAD, severe aortic regurgitation s/p AVR 10/2018 and aortic root replacement with CABGx1, aortic dilation, hypertension, hypothyroidism, type 2 diabetes, multiple myeloma, CKD IIIb-IV, migraine  PAIN:  Are you having pain? Yes: NPRS scale: 3-4/10 Pain location: R thigh Pain description: tight, stiff  Aggravating factors: reports this is chronic; worse in AM Relieving factors: exercises in bed   PRECAUTIONS: Fall and ICD/Pacemaker  RED FLAGS: None   WEIGHT BEARING RESTRICTIONS: No  FALLS: Has patient fallen in last 6 months? No  LIVING ENVIRONMENT: Lives with: lives with their spouse; caregiver for his spouse and requires ability to transfer her Lives in: House/apartment Stairs:  4 steps to enter; 3 story home Has following equipment at home: Single point cane, Shower bench, and raised commode  PLOF: Independent; caregiver for wife; working part time as Ophthalmologist- requires sitting, standing, walking  PATIENT GOALS: improve R LE strength  OBJECTIVE:    TODAY'S TREATMENT: 09/28/2023   LOWER EXTREMITY MMT:    MMT Right Eval Left Eval Right 09/28/2023 Left 09/28/2023  Hip flexion 4 4+ 4 4+  Hip extension      Hip abduction 4 4 3+ (sidelying) 4+  Hip adduction 4  4 4+ 4+  Hip internal rotation      Hip external rotation      Knee flexion 4 4+ 4+ 4+  Knee extension 4 4+ 4 4+  Ankle dorsiflexion 4+ 4+ 4+ 4+  Ankle plantarflexion 4+ 4+    Ankle inversion      Ankle eversion      (Blank rows = not tested)   Activity Comments  DGI 20/24 Improved from 17/24  ROM R ankle dorsiflexion:15 degrees  Stair negotiation Alternating pattern 1-2 UE support  FGA 19/30 Scores <22/30 indicate increased fall risk  Verbal review of HEP Verbalizes understanding       M-CTSIB  Condition 1: Firm Surface, EO 30 Sec, Normal Sway  Condition 2: Firm Surface, EC 30 Sec, Mild Sway  Condition 3: Foam Surface, EO 30 Sec, Mild Sway  Condition 4: Foam Surface, EC 20 Sec, Severe Sway     [facility] PT Assessment - 09/28/23 0951       Standardized Balance Assessment   Standardized Balance Assessment Dynamic Gait Index      Dynamic Gait Index   Level Surface Mild Impairment    Change in Gait Speed Normal    Gait with Horizontal Head Turns Normal    Gait with Vertical Head Turns Normal    Gait and Pivot Turn Normal    Step Over Obstacle Mild Impairment    Step Around Obstacles Normal    Steps Mild Impairment    Total Score 21      Functional Gait  Assessment   Gait assessed  Yes    Gait Level Surface Walks 20 ft, slow speed, abnormal gait pattern, evidence for imbalance or deviates 10-15 in outside of the 12 in walkway width. Requires more than 7 sec to ambulate 20 ft.   8.25   Change in Gait Speed Able to smoothly change walking speed without loss of balance or gait deviation. Deviate no more than 6 in outside of the 12 in walkway width.    Gait with Horizontal Head Turns Performs head turns smoothly with no change in gait. Deviates no more than 6 in outside 12 in walkway width    Gait with Vertical Head Turns Performs head turns with no change in gait. Deviates no more than 6 in outside 12 in walkway width.    Gait and Pivot Turn Pivot turns safely within 3 sec and  stops quickly with no loss of balance.    Step Over Obstacle Is able to step over one shoe box (4.5 in total height) but must slow down and adjust steps to clear box safely. May require verbal cueing.    Gait with Narrow Base of Support Ambulates less than 4 steps heel to toe or cannot perform without assistance.    Gait with Eyes Closed Walks 20 ft, slow speed, abnormal gait pattern, evidence for imbalance, deviates 10-15 in outside 12 in walkway width. Requires more than 9 sec to ambulate 20 ft.   20 sec, veers to R   Ambulating Backwards Walks 20 ft, uses assistive device, slower speed, mild gait deviations, deviates 6-10 in outside 12 in walkway width.  20.81 sec   Steps Alternating feet, must use rail.    Total Score 19    FGA comment: Scores <22/30 indicate increased fall risk             PATIENT EDUCATION: Education details: PT POC and progression to goals. Plan to continue aquatics/land based therapy each 1x/wk to further address strength, balance, gait.  Answered pt's questions about gentle UE strengthening exercises (biceps, triceps, scapular retraction) Person educated: Patient Education method: Explanation and Demonstration Education comprehension: verbalized understanding   Access Code: [email or web address] URL: [email or web address] Date: 08/22/2023 Prepared by: [facility] - Outpatient  Rehab - [facility]  Exercises - Standing Gastroc Stretch at Counter  - 1 x daily - 5 x weekly - 2 sets - 30 sec hold - Seated Hamstring Stretch  - 1 x daily - 5 x weekly - 2 sets - 30 sec hold - Standing Hip Abduction with Counter Support  - 1 x daily - 5 x weekly - 2 sets - 10 reps - Standing Hip Extension with Counter Support  - 1 x daily - 5 x weekly - 2 sets - 10 reps - Mini Squat with Counter Support  - 1 x daily - 5 x weekly - 2 sets - 10 reps - Side Stepping with Resistance at Ankles and Counter Support  - 1 x daily - 7 x weekly - 1 sets - 3-5 reps - Hip Flexor/Ankle  stretching  - 1 x daily - 7 x weekly - 1 sets - 3 reps - 30 sec hold - Tall Kneeling Hip Hinge  - 1 x daily - 7 x weekly   ------------------------------------------------- Note: Objective measures were completed at Evaluation unless otherwise noted.  DIAGNOSTIC FINDINGS: none recent  COGNITION: Overall cognitive status: Within functional limits for tasks assessed   SENSATION: Intact in B LEs to light touch   COORDINATION: Alternating pronation/supination: slightly slowed Alternating toe tap: B WNL Finger to nose: B WNL   POSTURE: rounded shoulders and forward head   *PALPATION: no TTP in B calf; slightly more soft tissue restriction on L  LOWER EXTREMITY ROM:     Active  Right Eval Left Eval  Hip flexion    Hip extension    Hip abduction    Hip adduction    Hip internal rotation    Hip external rotation    Knee flexion    Knee extension    Ankle dorsiflexion 14 *c/o pain in calf  10  Ankle plantarflexion    Ankle inversion    Ankle eversion     (Blank rows = not tested)  LOWER EXTREMITY MMT:    MMT Right Eval Left Eval  Hip flexion 4 4+  Hip extension    Hip abduction 4 4  Hip adduction 4 4  Hip internal rotation    Hip external rotation    Knee flexion 4 4+  Knee extension 4 4+  Ankle dorsiflexion 4+ 4+  Ankle plantarflexion 4+ 4+  Ankle inversion    Ankle eversion    (Blank rows = not tested)   GAIT: Gait pattern: Significant R-ward trunk lean with gait; slightly slowed gait speed  Assistive device utilized: None Level of assistance: Modified independence   FUNCTIONAL TESTS:   [facility] PT Assessment - 09/28/23 0951       Standardized Balance Assessment   Standardized Balance Assessment Dynamic Gait Index      Dynamic Gait Index   Level Surface Mild Impairment    Change in  Gait Speed Normal    Gait with Horizontal Head Turns Normal    Gait with Vertical Head Turns Normal    Gait and Pivot Turn Normal    Step Over Obstacle Mild  Impairment    Step Around Obstacles Normal    Steps Mild Impairment    Total Score 21      Functional Gait  Assessment   Gait assessed  Yes    Gait Level Surface Walks 20 ft, slow speed, abnormal gait pattern, evidence for imbalance or deviates 10-15 in outside of the 12 in walkway width. Requires more than 7 sec to ambulate 20 ft.   8.25   Change in Gait Speed Able to smoothly change walking speed without loss of balance or gait deviation. Deviate no more than 6 in outside of the 12 in walkway width.    Gait with Horizontal Head Turns Performs head turns smoothly with no change in gait. Deviates no more than 6 in outside 12 in walkway width    Gait with Vertical Head Turns Performs head turns with no change in gait. Deviates no more than 6 in outside 12 in walkway width.    Gait and Pivot Turn Pivot turns safely within 3 sec and stops quickly with no loss of balance.    Step Over Obstacle Is able to step over one shoe box (4.5 in total height) but must slow down and adjust steps to clear box safely. May require verbal cueing.    Gait with Narrow Base of Support Ambulates less than 4 steps heel to toe or cannot perform without assistance.    Gait with Eyes Closed Walks 20 ft, slow speed, abnormal gait pattern, evidence for imbalance, deviates 10-15 in outside 12 in walkway width. Requires more than 9 sec to ambulate 20 ft.   20 sec, veers to R   Ambulating Backwards Walks 20 ft, uses assistive device, slower speed, mild gait deviations, deviates 6-10 in outside 12 in walkway width.   20.81 sec   Steps Alternating feet, must use rail.    Total Score 19    FGA comment: Scores <22/30 indicate increased fall risk              GOALS: Goals reviewed with patient? Yes  SHORT TERM GOALS: Target date: 09/03/2023>UPDATED 10/26/2023  Patient to be independent with initial HEP. Baseline: HEP initiated Goal status: MET12/07/2023  Pt will perform Condition 4 on MCTSIB for 30 seconds moderate or  less sway Baseline:  Severe sway 20 sec Goal status:  INITIAL    LONG TERM GOALS: Target date: +09/24/2023>UPDATED TARGET 11/09/2023  Patient to be independent with advanced HEP. Baseline: Not yet initiated  Goal status: IN PROGRESS  Patient to demonstrate B LE strength >/=4+/5.  Baseline: See above Goal status: IN PROGRESS 09/28/2023  Patient to demonstrate L ankle dorsiflexion AROM to at least 14 deg in order to improve efficiency of gait.   Baseline: 10 deg>15 degrees Goal status: MET 09/28/2023  Patient to demonstrate safe stair navigation with alternating reciprocal pattern and no UE support.  Baseline: alternating reciprocal pattern with 1-2 UE support required ; continues to need UE support and alt pattern Goal status: IN PROGRESS 09/28/2023  Patient to score at least 20/24 on DGI in order to decrease risk of falls.  Baseline: 17>21/24 Goal status: MET1/23/2025  Patient to demonstrate 25% improvement in R-ward trunk lean with gait.  Baseline: significant R lean>slight improvement per report 09/28/2023 Goal status: IN PROGRESS 09/28/2023  Pt  will improve FGA score to 22/30 for decreased fall risk. Baseline:  19/30 Goal status:  INITIAL  Pt will report ability to push wife's wheelchair up and down ramp into home. Baseline:  Currently not able to do; nurse has to help Goal Status:  INITIAL  ASSESSMENT:  CLINICAL IMPRESSION: 10th Visit PN:  Pt reports he was doing better with posture, balance, strength, until sidelined with gout in the past several weeks.  That has cleared up now, but he had to cancel several appointments due to this.  Objective measures:  DGI:  21/24 (improved from 17/24), ankle dorsiflexion 15 degrees (improved from 10).  FGA score 19/30 indicated increased fall risk.  Additional measures above, including MCTSIB testing indicate decreased balance, and MMT indicates still some weakness RLE.  He is progressing towards goals; however, due to missing several  appointments with gout, he has not yet fully met LTGs.  He will continue to benefit from skilled PT to address strength, balance, gait for improved overall functional mobility and decreased fall risk.    OBJECTIVE IMPAIRMENTS: Abnormal gait, decreased balance, difficulty walking, decreased ROM, decreased strength, impaired flexibility, impaired sensation, postural dysfunction, and pain.   ACTIVITY LIMITATIONS: carrying, lifting, bending, standing, squatting, stairs, transfers, bathing, toileting, dressing, locomotion level, and caring for others  PARTICIPATION LIMITATIONS: meal prep, cleaning, laundry, shopping, community activity, occupation, yard work, and church  PERSONAL FACTORS: Age, Past/current experiences, Time since onset of injury/illness/exacerbation, and 3+ comorbidities: HFrEF, atrial fibrillation, VF cardiac arrest s/p ICD, CAD, severe aortic regurgitation s/p AVR 10/2018 and aortic root replacement with CABGx1, aortic dilation, hypertension, hypothyroidism, type 2 diabetes, multiple myeloma, CKD IIIb-IV, migraine  are also affecting patient's functional outcome.   REHAB POTENTIAL: Good  CLINICAL DECISION MAKING: Evolving/moderate complexity  EVALUATION COMPLEXITY: Moderate  PLAN:  PT FREQUENCY: 2x/week  PT DURATION: 6 weeks  PLANNED INTERVENTIONS: [id number]- PT Re-evaluation, 97110-Therapeutic exercises, 97530- Therapeutic activity, 97112- Neuromuscular re-education, 97535- Self Care, [id number]- Manual therapy, [phone]- Gait training, [phone]- Canalith repositioning, [phone]- Aquatic Therapy, Patient/Family education, Balance training, Stair training, Taping, Dry Needling, Joint mobilization, Spinal mobilization, Vestibular training, DME instructions, Cryotherapy, and Moist heat  PLAN FOR NEXT SESSION: Warm up with recumbent bike for RLE flexibility; progress Bilat hip and R knee strength, stairs-step ups, step downs, step taps (consider adding to HEP), balance challenges on level/unlevel  surfaces, treadmill for incline/decline  AQUATICS: Frequency: 1x/wk Duration: 6 wks Special Instruction: R LE strengthening, balance     Greig Anon, PT 09/28/23 12:57 PM Phone: [phone] Fax: [phone]  [facility] at [facility] Neuro [address] [place], [place] [address] Phone # [phone] Fax # [phone]

## 2023-10-01 ENCOUNTER — Ambulatory Visit: Payer: Self-pay | Admitting: Physical Therapy

## 2023-10-01 ENCOUNTER — Encounter: Payer: Self-pay | Admitting: Physical Therapy

## 2023-10-01 ENCOUNTER — Ambulatory Visit: Payer: Medicare Other | Admitting: Physical Therapy

## 2023-10-01 DIAGNOSIS — R2689 Other abnormalities of gait and mobility: Secondary | ICD-10-CM | POA: Diagnosis not present

## 2023-10-01 DIAGNOSIS — M6281 Muscle weakness (generalized): Secondary | ICD-10-CM | POA: Diagnosis not present

## 2023-10-01 DIAGNOSIS — R2681 Unsteadiness on feet: Secondary | ICD-10-CM | POA: Diagnosis not present

## 2023-10-01 NOTE — Therapy (Signed)
 OUTPATIENT PHYSICAL THERAPY/AQUATIC THERAPY TREATMENT NOTE   Patient Name: Demario Faniel., MD MRN: [id number] DOB:1951/02/07, 73 y.o., male Today's Date: 10/01/2023   PCP:    Jarold Medici, MD   REFERRING PROVIDER: Raford Riggs, MD   END OF SESSION:  PT End of Session - 10/01/23 1934     Visit Number 11    Number of Visits 22    Date for PT Re-Evaluation 11/09/23    Authorization Type Medicare/[employer]    PT Start Time 1540   pt arrived 10 late for appt   PT Stop Time 1620    PT Time Calculation (min) 40 min    Equipment Utilized During Treatment Other (comment)   aquatic step   Activity Tolerance Patient tolerated treatment well    Behavior During Therapy WFL for tasks assessed/performed             Past Medical History:  Diagnosis Date   Acute blood loss anemia    Acute encephalopathy    Acute hypoxemic respiratory failure (HCC)    Acute idiopathic gout of right ankle    Acute lumbar back pain    Acute on chronic systolic heart failure (HCC)    Acute respiratory failure (HCC)    AKI (acute kidney injury) (HCC)    Altered mental status    Aortic aneurysm without rupture (HCC) 02/09/2016   Aortic root enlargement (HCC) 02/13/2012   Aortic valve regurgitation, acquired 02/13/2012   Arrhythmia    Atrial flutter (HCC) 03/18/2012   Back pain    Cardiac arrest (HCC) 02/01/2016   CHF (congestive heart failure) (HCC)    Chronic anticoagulation 03/04/2013   Chronic combined systolic and diastolic heart failure (HCC)    Chronic kidney disease    kidney fx studies increased    Chronic lower back pain    Chronic renal insufficiency    Chronic systolic heart failure (HCC) 02/13/2012   Recent diagnosis 4 / 2013, LVEF 25% by Echo 12/2011  03/2013: Echo at [facility] Card[facility] Conclusions: 1. Left ventricular ejection fraction estimated by 2D at 40-45 percent. 2. Mild concentric left ventricular hypertrophy. 3. Mild left atrial enlargement. 4. Moderate aortic valve  regurgitation. 5. The aortic root at the sinus(es) of valsalva is moderately dilated 6. Mild mitral valve regurgitation. 7.   CKD (chronic kidney disease)    CKD (chronic kidney disease), stage IV (HCC) 08/06/2013   Creatinine 2.4 on 07/04/13    Claustrophobia    Colon cancer screening 03/04/2013   Debility 02/22/2016   Diabetes mellitus type 2 in nonobese [facility])    Diabetes mellitus type 2 in obese    Dysrhythmia    palpitations   Encounter for central line placement    Exertional dyspnea 01/2012   Femoral nerve injury 02/22/2016   Femoral neuropathy    Goals of care, counseling/discussion 05/05/2020   HCAP (healthcare-associated pneumonia)    Heart murmur    Hyperlipidemia 02/13/2012   Hypertension    Hypothyroidism    Internal hemorrhoids without mention of complication 04/11/2013   Labile blood pressure    Left bundle branch block 02/13/2012   Leg weakness, bilateral    Long term (current) use of anticoagulants 08/06/2013   Eliquis  therapy    Long term current use of amiodarone  08/07/2016   Lower extremity weakness    Migraine 02/13/2012   opthalmic   Multiple myeloma (HCC) 05/05/2020   Non-traumatic rhabdomyolysis    Obesity (BMI 30-39.9) 02/13/2012   Pain    Paroxysmal atrial fibrillation (  HCC)    Pneumonia    Retroperitoneal bleed    Right ankle pain    Right knee pain    Severe aortic regurgitation 02/09/2016   Special screening for malignant neoplasms, colon 04/11/2013   Thyroid  activity decreased    Tibial pain    Varicose vein of leg    right   Ventricular fibrillation (HCC) 02/09/2016   Weakness of both lower extremities    Past Surgical History:  Procedure Laterality Date   AORTIC VALVE REPLACEMENT  11/20/2018   CARDIAC CATHETERIZATION N/A 02/17/2016   Procedure: Left Heart Cath and Coronary Angiography;  Surgeon: Candyce GORMAN Reek, MD;  Location: [facility] INVASIVE CV LAB;  Service: Cardiovascular;  Laterality: N/A;   CARDIOVERSION  03/22/2012    Procedure: CARDIOVERSION;  Surgeon: Oneil Parchment, MD;  Location: [facility] ENDOSCOPY;  Service: Cardiovascular;  Laterality: N/A;   CARDIOVERSION  04/19/2012   Procedure: CARDIOVERSION;  Surgeon: Victory LELON Claudene DOUGLAS, MD;  Location: [facility] OR;  Service: Cardiovascular;  Laterality: N/A;   COLONOSCOPY N/A 04/11/2013   Procedure: COLONOSCOPY;  Surgeon: Lamar JONETTA Aho, MD;  Location: [facility] ENDOSCOPY;  Service: Endoscopy;  Laterality: N/A;   COLONOSCOPY N/A 04/11/2013   Procedure: COLONOSCOPY;  Surgeon: Lamar JONETTA Aho, MD;  Location: [facility] ENDOSCOPY;  Service: Endoscopy;  Laterality: N/A;   EP IMPLANTABLE DEVICE N/A 02/17/2016   Procedure: BiV ICD Insertion CRT-D;  Surgeon: Danelle LELON Birmingham, MD;  Location: [facility] INVASIVE CV LAB;  Service: Cardiovascular;  Laterality: N/A;   FINGER SURGERY  2012   4th digit right hand; thumb on left hand   RADIOLOGY WITH ANESTHESIA N/A 02/11/2016   Procedure: MRI OF THE BRAIN WITHOUT CONTRAST, LUMBAR WITHOUT CONTRAST;  Surgeon: Medication Radiologist, MD;  Location: [facility] OR;  Service: Radiology;  Laterality: N/A;  DR. WOOD/MRI   RIGHT/LEFT HEART CATH AND CORONARY ANGIOGRAPHY N/A 07/26/2018   Procedure: RIGHT/LEFT HEART CATH AND CORONARY ANGIOGRAPHY;  Surgeon: Claudene Victory LELON, MD;  Location: [facility] INVASIVE CV LAB;  Service: Cardiovascular;  Laterality: N/A;   Skin melanocytoma excision  2012   above left clavicle   STERNAL INCISION RECLOSURE  11/2018   STERNAL WIRE REMOVAL  11/2018   STERNAL WOUND DEBRIDEMENT  11/2018   TEE WITHOUT CARDIOVERSION  03/22/2012   Procedure: TRANSESOPHAGEAL ECHOCARDIOGRAM (TEE);  Surgeon: Oneil Parchment, MD;  Location: [facility] ENDOSCOPY;  Service: Cardiovascular;  Laterality: N/A;   TEE WITHOUT CARDIOVERSION N/A 02/08/2016   Procedure: TRANSESOPHAGEAL ECHOCARDIOGRAM (TEE);  Surgeon: Redell GORMAN Shallow, MD;  Location: [facility] ENDOSCOPY;  Service: Cardiovascular;  Laterality: N/A;   Patient Active Problem List   Diagnosis Date Noted   Enlarged prostate 09/10/2023   Dyslipidemia due  to type 2 diabetes mellitus (HCC) 09/10/2023   Acquired thrombophilia (HCC) 05/14/2023   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   Goals of care, counseling/discussion 05/05/2020   Multiple myeloma (HCC) 05/05/2020   S/P flap graft 01/07/2019   Coronary artery disease 01/07/2019   Long term (current) use of antibiotics 01/03/2019   Essential hypertension 12/27/2018   Postoperative infection of wound of sternum 12/27/2018   Decreased strength, endurance, and mobility 12/27/2018   Cardiac LV ejection fraction 30-35% 12/27/2018   Decreased activities of daily living (ADL) 12/27/2018   Klebsiella infection 12/27/2018   S/P AVR (aortic valve replacement) 11/23/2018   S/P ascending aortic aneurysm repair 11/23/2018   NICM (nonischemic cardiomyopathy) (HCC) 11/19/2018   AICD (automatic cardioverter/defibrillator) present 11/19/2018   Other symptoms and signs involving the musculoskeletal system 05/08/2018   Long term  current use of amiodarone  08/07/2016   Type 2 diabetes mellitus with stage 3b chronic kidney disease, without long-term current use of insulin  (HCC)    Femoral neuropathy    Leg weakness, bilateral    Paroxysmal atrial fibrillation (HCC)    Ventricular fibrillation (HCC) 02/09/2016   Cardiac arrest (HCC) 02/01/2016   Thyroid  activity decreased    Arrhythmia    CKD (chronic kidney disease), stage IV (HCC) 08/06/2013    Class: Chronic   Long term current use of anticoagulant 03/04/2013   Atrial flutter (HCC) 03/18/2012    Class: Acute   Chronic systolic heart failure (HCC) 02/13/2012    Class: Acute   Hypertension, accelerated 02/13/2012   Hyperlipidemia 02/13/2012   Left bundle branch block 02/13/2012   Obesity (BMI 30-39.9) 02/13/2012    ONSET DATE: quite some time reports years   REFERRING DIAG:  G62.9 (ICD-10-CM) - Neuropathy Reprot THERAPY DIAG:  Unsteadiness on feet  Muscle weakness (generalized)  Other abnormalities of gait and  mobility  Rationale for Evaluation and Treatment: Rehabilitation  SUBJECTIVE:                                                                                                                                                                                             SUBJECTIVE STATEMENT: Pt arrives to aquatic therapy session 10 late - reports he was seen in clinic last week and made some improvement on some of the tests - feels he is making some progress with balance   Pt accompanied by: self  PERTINENT HISTORY: HFrEF, atrial fibrillation, VF cardiac arrest s/p ICD, CAD, severe aortic regurgitation s/p AVR 10/2018 and aortic root replacement with CABGx1, aortic dilation, hypertension, hypothyroidism, type 2 diabetes, multiple myeloma, CKD IIIb-IV, migraine  PAIN:  Are you having pain? Yes: NPRS scale: did not rate/10 Pain location: R knee Pain description: tight, stiff  Aggravating factors: reports this is chronic; worse in AM Relieving factors: exercises in bed   PRECAUTIONS: Fall and ICD/Pacemaker  RED FLAGS: None   WEIGHT BEARING RESTRICTIONS: No  FALLS: Has patient fallen in last 6 months? No  LIVING ENVIRONMENT: Lives with: lives with their spouse; caregiver for his spouse and requires ability to transfer her Lives in: House/apartment Stairs:  4 steps to enter; 3 story home Has following equipment at home: Single point cane, Shower bench, and raised commode  PLOF: Independent; caregiver for wife; working part time as Ophthalmologist- requires sitting, standing, walking   PATIENT GOALS: improve R LE strength  OBJECTIVE:  Note: Objective measures were completed at Evaluation unless otherwise noted.  DIAGNOSTIC FINDINGS: none recent  COGNITION: Overall cognitive  status: Within functional limits for tasks assessed   SENSATION: Intact in B LEs to light touch   COORDINATION: Alternating pronation/supination: slightly slowed Alternating toe tap: B WNL Finger to  nose: B WNL   POSTURE: rounded shoulders and forward head   *PALPATION: no TTP in B calf; slightly more soft tissue restriction on L  LOWER EXTREMITY ROM:     Active  Right Eval Left Eval  Hip flexion    Hip extension    Hip abduction    Hip adduction    Hip internal rotation    Hip external rotation    Knee flexion    Knee extension    Ankle dorsiflexion 14 *c/o pain in calf  10  Ankle plantarflexion    Ankle inversion    Ankle eversion     (Blank rows = not tested)  LOWER EXTREMITY MMT:    MMT Right Eval Left Eval  Hip flexion 4 4+  Hip extension    Hip abduction 4 4  Hip adduction 4 4  Hip internal rotation    Hip external rotation    Knee flexion 4 4+  Knee extension 4 4+  Ankle dorsiflexion 4+ 4+  Ankle plantarflexion 4+ 4+  Ankle inversion    Ankle eversion    (Blank rows = not tested)   GAIT: Gait pattern: Significant R-ward trunk lean with gait; slightly slowed gait speed  Assistive device utilized: None Level of assistance: Modified independence   FUNCTIONAL TESTS:    TODAY'S TREATMENT:                                                                                                                              DATE: 10-01-23  Aquatic PT at [facility] - pool temp 90 degrees  Patient seen for aquatic therapy today.  Treatment took place in water 3.6 - 4.5 feet deep depending upon activity.  Pt entered and exited the pool via step negotiation with use of hand rails with step by step sequence with supervision.  Warm up; pt performed water walking forwards, backwards and sideways 18' x 2 reps each direction   Stretching - pt performed runner's stretch RLE and LLE 20 sec x 1 rep each leg for hamstring & gastroc stretch; performed this stretch at start and again at end of session (1 rep each) Gastroc stretch with bil. Forefeet on pool wall, heels on floor - 20 sec hold x 1 rep each leg  Pt performed marching in place 10 reps - with contralateral UE  movement:  marching forwards/backwards 18' x 2 reps - cues to perform slowly for improved SLS on each leg  Squats x 10 reps bil. LE's: Rt unilateral squats 10 reps:  Lt unilateral squats 10 reps with UE support on pool edge  Performed hip flexion/extension and abduction/adduction, 1/2 march 10 reps each exercise each leg   Pt stood on LLE - made circles CW 10 reps and then CCW RLE  10 reps for improved SLS on LLE; stood on RLE - made circles CW and CCW with LLE  reps each direction for improved SLS on RLE - no increased resistance used   Balance exercises - pt stood with feet together  - performed horizontal and vertical head turns 5 reps each direction; stood with EC for 10 secs; added head turns horizontally with EC 5 reps - feet shoulder width apart with standing with EC   Step ups onto aquatic step - 10 reps each LE; step down 10 reps RLE and 10 reps LLE for eccentric quad strengthening  Standing near wall but no UE support used - pt performed alternate tap ups to aquatic step 10 reps each leg for improved SLS on each leg  Pt requires buoyancy of water for support for reduced fall risk as pt able to tolerate increased standing and ambulation in water compared to that on land.  Balance exercises able to be safely performed in aquatic environment with buoyancy for support for decreased fall risk compared to land based exercises.  Viscosity of water is needed for resistance for strengthening and current of water provides perturbations for challenge for balance training.     PATIENT EDUCATION: Education details: prognosis, POC, HEP, answered pt's questions on leg press machine, edu on aquatic therapy as pt requests this Person educated: Patient Education method: Explanation, Demonstration, Tactile cues, Verbal cues, and Handouts Education comprehension: verbalized understanding and returned demonstration  HOME EXERCISE PROGRAM: Access Code: [email or web address] URL:  [email or web address] Date: 08/13/2023 Prepared by: [facility] - Outpatient  Rehab - [facility]  Exercises - Standing Gastroc Stretch at Counter  - 1 x daily - 5 x weekly - 2 sets - 30 sec hold - Seated Hamstring Stretch  - 1 x daily - 5 x weekly - 2 sets - 30 sec hold - Standing Hip Abduction with Counter Support  - 1 x daily - 5 x weekly - 2 sets - 10 reps - Standing Hip Extension with Counter Support  - 1 x daily - 5 x weekly - 2 sets - 10 reps - Mini Squat with Counter Support  - 1 x daily - 5 x weekly - 2 sets - 10 reps  GOALS: Goals reviewed with patient? Yes   SHORT TERM GOALS: Target date: 09/03/2023>UPDATED 10/26/2023  Patient to be independent with initial HEP. Baseline: HEP initiated Goal status: MET12/07/2023  Pt will perform Condition 4 on MCTSIB for 30 seconds moderate or less sway Baseline:  Severe sway 20 sec Goal status:  INITIAL    LONG TERM GOALS: Target date: +09/24/2023>UPDATED TARGET 11/09/2023  Patient to be independent with advanced HEP. Baseline: Not yet initiated  Goal status: IN PROGRESS  Patient to demonstrate B LE strength >/=4+/5.  Baseline: See above Goal status: IN PROGRESS 09/28/2023  Patient to demonstrate L ankle dorsiflexion AROM to at least 14 deg in order to improve efficiency of gait.   Baseline: 10 deg>15 degrees Goal status: MET 09/28/2023  Patient to demonstrate safe stair navigation with alternating reciprocal pattern and no UE support.  Baseline: alternating reciprocal pattern with 1-2 UE support required ; continues to need UE support and alt pattern Goal status: IN PROGRESS 09/28/2023  Patient to score at least 20/24 on DGI in order to decrease risk of falls.  Baseline: 17>21/24 Goal status: MET1/23/2025  Patient to demonstrate 25% improvement in R-ward trunk lean with gait.  Baseline: significant R lean>slight improvement per report 09/28/2023 Goal status: IN PROGRESS 09/28/2023  Pt  will improve FGA score to  22/30 for decreased fall risk. Baseline:  19/30 Goal status:  INITIAL  Pt will report ability to push wife's wheelchair up and down ramp into home. Baseline:  Currently not able to do; nurse has to help Goal Status:  INITIAL   ASSESSMENT:  CLINICAL IMPRESSION: Aquatic PT session focused on bil. LE strengthening, stretching and balance exercises to improve SLS on each leg.  Pt reported increased tightness in Rt quad at end of session; performed quad stretch in standing which pt reported provided some relief.   Pt continues to need intermittent UE support on pool edge for assist with balance with SLS activities.  Cont with POC.    OBJECTIVE IMPAIRMENTS: Abnormal gait, decreased balance, difficulty walking, decreased ROM, decreased strength, impaired flexibility, impaired sensation, postural dysfunction, and pain.   ACTIVITY LIMITATIONS: carrying, lifting, bending, standing, squatting, stairs, transfers, bathing, toileting, dressing, locomotion level, and caring for others  PARTICIPATION LIMITATIONS: meal prep, cleaning, laundry, shopping, community activity, occupation, yard work, and church  PERSONAL FACTORS: Age, Past/current experiences, Time since onset of injury/illness/exacerbation, and 3+ comorbidities: HFrEF, atrial fibrillation, VF cardiac arrest s/p ICD, CAD, severe aortic regurgitation s/p AVR 10/2018 and aortic root replacement with CABGx1, aortic dilation, hypertension, hypothyroidism, type 2 diabetes, multiple myeloma, CKD IIIb-IV, migraine  are also affecting patient's functional outcome.   REHAB POTENTIAL: Good  CLINICAL DECISION MAKING: Evolving/moderate complexity  EVALUATION COMPLEXITY: Moderate  PLAN:  PT FREQUENCY: 2x/week  PT DURATION: 6 weeks  PLANNED INTERVENTIONS: 97164- PT Re-evaluation, 97110-Therapeutic exercises, 97530- Therapeutic activity, 97112- Neuromuscular re-education, 97535- Self Care, [id number]- Manual therapy, [phone]- Gait training, [phone]- Canalith  repositioning, [phone]- Aquatic Therapy, Patient/Family education, Balance training, Stair training, Taping, Dry Needling, Joint mobilization, Spinal mobilization, Vestibular training, DME instructions, Cryotherapy, and Moist heat   PLAN FOR NEXT SESSION:  Warm up with recumbent bike for RLE flexibility; progress Bilat hip and R knee strength, stairs-step ups, step downs, step taps (consider adding to HEP), balance challenges on level/unlevel surfaces, treadmill for incline/decline   AQUATICS: Frequency: 1x/wk Duration: 6 wks Special Instruction: R LE strengthening, balance    Elvie Kussmaul, PT 10/01/23 7:40 PM

## 2023-10-05 ENCOUNTER — Ambulatory Visit: Payer: Medicare Other

## 2023-10-05 DIAGNOSIS — R2689 Other abnormalities of gait and mobility: Secondary | ICD-10-CM | POA: Diagnosis not present

## 2023-10-05 DIAGNOSIS — M6281 Muscle weakness (generalized): Secondary | ICD-10-CM | POA: Diagnosis not present

## 2023-10-05 DIAGNOSIS — R2681 Unsteadiness on feet: Secondary | ICD-10-CM | POA: Diagnosis not present

## 2023-10-05 NOTE — Therapy (Signed)
 OUTPATIENT PHYSICAL THERAPY/AQUATIC THERAPY TREATMENT NOTE   Patient Name: Eric Hunger., MD MRN: [id number] DOB:Aug 08, 1951, 73 y.o., male Today's Date: 10/05/2023   PCP:    Jarold Medici, MD   REFERRING PROVIDER: Raford Riggs, MD   END OF SESSION:  PT End of Session - 10/05/23 0850     Visit Number 12    Number of Visits 22    Date for PT Re-Evaluation 11/09/23    Authorization Type Medicare/[employer]    PT Start Time 0850    PT Stop Time 0930    PT Time Calculation (min) 40 min    Equipment Utilized During Treatment Other (comment)   aquatic step   Activity Tolerance Patient tolerated treatment well    Behavior During Therapy [facility] for tasks assessed/performed             Past Medical History:  Diagnosis Date   Acute blood loss anemia    Acute encephalopathy    Acute hypoxemic respiratory failure (HCC)    Acute idiopathic gout of right ankle    Acute lumbar back pain    Acute on chronic systolic heart failure (HCC)    Acute respiratory failure (HCC)    AKI (acute kidney injury) (HCC)    Altered mental status    Aortic aneurysm without rupture (HCC) 02/09/2016   Aortic root enlargement (HCC) 02/13/2012   Aortic valve regurgitation, acquired 02/13/2012   Arrhythmia    Atrial flutter (HCC) 03/18/2012   Back pain    Cardiac arrest (HCC) 02/01/2016   CHF (congestive heart failure) (HCC)    Chronic anticoagulation 03/04/2013   Chronic combined systolic and diastolic heart failure (HCC)    Chronic kidney disease    kidney fx studies increased    Chronic lower back pain    Chronic renal insufficiency    Chronic systolic heart failure (HCC) 02/13/2012   Recent diagnosis 4 / 2013, LVEF 25% by Echo 12/2011  03/2013: Echo at [facility] Conclusions: 1. Left ventricular ejection fraction estimated by 2D at 40-45 percent. 2. Mild concentric left ventricular hypertrophy. 3. Mild left atrial enlargement. 4. Moderate aortic valve regurgitation. 5. The aortic root at  the sinus(es) of valsalva is moderately dilated 6. Mild mitral valve regurgitation. 7.   CKD (chronic kidney disease)    CKD (chronic kidney disease), stage IV (HCC) 08/06/2013   Creatinine 2.4 on 07/04/13    Claustrophobia    Colon cancer screening 03/04/2013   Debility 02/22/2016   Diabetes mellitus type 2 in nonobese [facility])    Diabetes mellitus type 2 in obese    Dysrhythmia    palpitations   Encounter for central line placement    Exertional dyspnea 01/2012   Femoral nerve injury 02/22/2016   Femoral neuropathy    Goals of care, counseling/discussion 05/05/2020   HCAP (healthcare-associated pneumonia)    Heart murmur    Hyperlipidemia 02/13/2012   Hypertension    Hypothyroidism    Internal hemorrhoids without mention of complication 04/11/2013   Labile blood pressure    Left bundle branch block 02/13/2012   Leg weakness, bilateral    Long term (current) use of anticoagulants 08/06/2013   Eliquis  therapy    Long term current use of amiodarone  08/07/2016   Lower extremity weakness    Migraine 02/13/2012   opthalmic   Multiple myeloma (HCC) 05/05/2020   Non-traumatic rhabdomyolysis    Obesity (BMI 30-39.9) 02/13/2012   Pain    Paroxysmal atrial fibrillation (HCC)    Pneumonia  Retroperitoneal bleed    Right ankle pain    Right knee pain    Severe aortic regurgitation 02/09/2016   Special screening for malignant neoplasms, colon 04/11/2013   Thyroid  activity decreased    Tibial pain    Varicose vein of leg    right   Ventricular fibrillation (HCC) 02/09/2016   Weakness of both lower extremities    Past Surgical History:  Procedure Laterality Date   AORTIC VALVE REPLACEMENT  11/20/2018   CARDIAC CATHETERIZATION N/A 02/17/2016   Procedure: Left Heart Cath and Coronary Angiography;  Surgeon: Candyce GORMAN Reek, MD;  Location: [facility] INVASIVE CV LAB;  Service: Cardiovascular;  Laterality: N/A;   CARDIOVERSION  03/22/2012   Procedure: CARDIOVERSION;  Surgeon: Oneil Parchment, MD;  Location: [facility] ENDOSCOPY;  Service: Cardiovascular;  Laterality: N/A;   CARDIOVERSION  04/19/2012   Procedure: CARDIOVERSION;  Surgeon: Victory LELON Claudene DOUGLAS, MD;  Location: [facility] OR;  Service: Cardiovascular;  Laterality: N/A;   COLONOSCOPY N/A 04/11/2013   Procedure: COLONOSCOPY;  Surgeon: Lamar JONETTA Aho, MD;  Location: [facility] ENDOSCOPY;  Service: Endoscopy;  Laterality: N/A;   COLONOSCOPY N/A 04/11/2013   Procedure: COLONOSCOPY;  Surgeon: Lamar JONETTA Aho, MD;  Location: [facility] ENDOSCOPY;  Service: Endoscopy;  Laterality: N/A;   EP IMPLANTABLE DEVICE N/A 02/17/2016   Procedure: BiV ICD Insertion CRT-D;  Surgeon: Danelle LELON Birmingham, MD;  Location: [facility] INVASIVE CV LAB;  Service: Cardiovascular;  Laterality: N/A;   FINGER SURGERY  2012   4th digit right hand; thumb on left hand   RADIOLOGY WITH ANESTHESIA N/A 02/11/2016   Procedure: MRI OF THE BRAIN WITHOUT CONTRAST, LUMBAR WITHOUT CONTRAST;  Surgeon: Medication Radiologist, MD;  Location: [facility] OR;  Service: Radiology;  Laterality: N/A;  DR. WOOD/MRI   RIGHT/LEFT HEART CATH AND CORONARY ANGIOGRAPHY N/A 07/26/2018   Procedure: RIGHT/LEFT HEART CATH AND CORONARY ANGIOGRAPHY;  Surgeon: Claudene Victory LELON, MD;  Location: [facility] INVASIVE CV LAB;  Service: Cardiovascular;  Laterality: N/A;   Skin melanocytoma excision  2012   above left clavicle   STERNAL INCISION RECLOSURE  11/2018   STERNAL WIRE REMOVAL  11/2018   STERNAL WOUND DEBRIDEMENT  11/2018   TEE WITHOUT CARDIOVERSION  03/22/2012   Procedure: TRANSESOPHAGEAL ECHOCARDIOGRAM (TEE);  Surgeon: Oneil Parchment, MD;  Location: [facility] ENDOSCOPY;  Service: Cardiovascular;  Laterality: N/A;   TEE WITHOUT CARDIOVERSION N/A 02/08/2016   Procedure: TRANSESOPHAGEAL ECHOCARDIOGRAM (TEE);  Surgeon: Redell GORMAN Shallow, MD;  Location: [facility] ENDOSCOPY;  Service: Cardiovascular;  Laterality: N/A;   Patient Active Problem List   Diagnosis Date Noted   Enlarged prostate 09/10/2023   Dyslipidemia due to type 2 diabetes mellitus (HCC)  09/10/2023   Acquired thrombophilia (HCC) 05/14/2023   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   Goals of care, counseling/discussion 05/05/2020   Multiple myeloma (HCC) 05/05/2020   S/P flap graft 01/07/2019   Coronary artery disease 01/07/2019   Long term (current) use of antibiotics 01/03/2019   Essential hypertension 12/27/2018   Postoperative infection of wound of sternum 12/27/2018   Decreased strength, endurance, and mobility 12/27/2018   Cardiac LV ejection fraction 30-35% 12/27/2018   Decreased activities of daily living (ADL) 12/27/2018   Klebsiella infection 12/27/2018   S/P AVR (aortic valve replacement) 11/23/2018   S/P ascending aortic aneurysm repair 11/23/2018   NICM (nonischemic cardiomyopathy) (HCC) 11/19/2018   AICD (automatic cardioverter/defibrillator) present 11/19/2018   Other symptoms and signs involving the musculoskeletal system 05/08/2018   Long term current use of amiodarone  08/07/2016   Type  2 diabetes mellitus with stage 3b chronic kidney disease, without long-term current use of insulin  (HCC)    Femoral neuropathy    Leg weakness, bilateral    Paroxysmal atrial fibrillation (HCC)    Ventricular fibrillation (HCC) 02/09/2016   Cardiac arrest (HCC) 02/01/2016   Thyroid  activity decreased    Arrhythmia    CKD (chronic kidney disease), stage IV (HCC) 08/06/2013    Class: Chronic   Long term current use of anticoagulant 03/04/2013   Atrial flutter (HCC) 03/18/2012    Class: Acute   Chronic systolic heart failure (HCC) 02/13/2012    Class: Acute   Hypertension, accelerated 02/13/2012   Hyperlipidemia 02/13/2012   Left bundle branch block 02/13/2012   Obesity (BMI 30-39.9) 02/13/2012    ONSET DATE: quite some time reports years   REFERRING DIAG:  G62.9 (ICD-10-CM) - Neuropathy Reprot THERAPY DIAG:  Unsteadiness on feet  Muscle weakness (generalized)  Other abnormalities of gait and mobility  Rationale for Evaluation and Treatment:  Rehabilitation  SUBJECTIVE:                                                                                                                                                                                             SUBJECTIVE STATEMENT: Pt arrives to aquatic therapy session 10 late - reports he was seen in clinic last week and made some improvement on some of the tests - feels he is making some progress with balance   Pt accompanied by: self  PERTINENT HISTORY: HFrEF, atrial fibrillation, VF cardiac arrest s/p ICD, CAD, severe aortic regurgitation s/p AVR 10/2018 and aortic root replacement with CABGx1, aortic dilation, hypertension, hypothyroidism, type 2 diabetes, multiple myeloma, CKD IIIb-IV, migraine  PAIN:  Are you having pain? Yes: NPRS scale: did not rate/10 Pain location: R knee Pain description: tight, stiff  Aggravating factors: reports this is chronic; worse in AM Relieving factors: exercises in bed   PRECAUTIONS: Fall and ICD/Pacemaker  RED FLAGS: None   WEIGHT BEARING RESTRICTIONS: No  FALLS: Has patient fallen in last 6 months? No  LIVING ENVIRONMENT: Lives with: lives with their spouse; caregiver for his spouse and requires ability to transfer her Lives in: House/apartment Stairs:  4 steps to enter; 3 story home Has following equipment at home: Single point cane, Shower bench, and raised commode  PLOF: Independent; caregiver for wife; working part time as Ophthalmologist- requires sitting, standing, walking   PATIENT GOALS: improve R LE strength  OBJECTIVE:   TODAY'S TREATMENT: 10/05/23 Activity Comments  Weighted w/c push 4x25 ft To improve ability to push spouse up ramp  Corner balance+ strength Under multisensory conditions  HEP review Upgrade to green t-band  Dynamic gait activities           Note: Objective measures were completed at Evaluation unless otherwise noted.  DIAGNOSTIC FINDINGS: none recent  COGNITION: Overall cognitive status:  Within functional limits for tasks assessed   SENSATION: Intact in B LEs to light touch   COORDINATION: Alternating pronation/supination: slightly slowed Alternating toe tap: B WNL Finger to nose: B WNL   POSTURE: rounded shoulders and forward head   *PALPATION: no TTP in B calf; slightly more soft tissue restriction on L  LOWER EXTREMITY ROM:     Active  Right Eval Left Eval  Hip flexion    Hip extension    Hip abduction    Hip adduction    Hip internal rotation    Hip external rotation    Knee flexion    Knee extension    Ankle dorsiflexion 14 *c/o pain in calf  10  Ankle plantarflexion    Ankle inversion    Ankle eversion     (Blank rows = not tested)  LOWER EXTREMITY MMT:    MMT Right Eval Left Eval  Hip flexion 4 4+  Hip extension    Hip abduction 4 4  Hip adduction 4 4  Hip internal rotation    Hip external rotation    Knee flexion 4 4+  Knee extension 4 4+  Ankle dorsiflexion 4+ 4+  Ankle plantarflexion 4+ 4+  Ankle inversion    Ankle eversion    (Blank rows = not tested)   GAIT: Gait pattern: Significant R-ward trunk lean with gait; slightly slowed gait speed  Assistive device utilized: None Level of assistance: Modified independence   FUNCTIONAL TESTS:    TODAY'S TREATMENT:                                                                                                                              DATE: 10-01-23  Aquatic PT at [facility] - pool temp 90 degrees  Patient seen for aquatic therapy today.  Treatment took place in water 3.6 - 4.5 feet deep depending upon activity.  Pt entered and exited the pool via step negotiation with use of hand rails with step by step sequence with supervision.  Warm up; pt performed water walking forwards, backwards and sideways 18' x 2 reps each direction   Stretching - pt performed runner's stretch RLE and LLE 20 sec x 1 rep each leg for hamstring & gastroc stretch; performed this stretch at start and  again at end of session (1 rep each) Gastroc stretch with bil. Forefeet on pool wall, heels on floor - 20 sec hold x 1 rep each leg  Pt performed marching in place 10 reps - with contralateral UE movement:  marching forwards/backwards 18' x 2 reps - cues to perform slowly for improved SLS on each leg  Squats x 10 reps bil. LE's: Rt unilateral squats 10 reps:  Lt unilateral  squats 10 reps with UE support on pool edge  Performed hip flexion/extension and abduction/adduction, 1/2 march 10 reps each exercise each leg   Pt stood on LLE - made circles CW 10 reps and then CCW RLE 10 reps for improved SLS on LLE; stood on RLE - made circles CW and CCW with LLE  reps each direction for improved SLS on RLE - no increased resistance used   Balance exercises - pt stood with feet together  - performed horizontal and vertical head turns 5 reps each direction; stood with EC for 10 secs; added head turns horizontally with EC 5 reps - feet shoulder width apart with standing with EC   Step ups onto aquatic step - 10 reps each LE; step down 10 reps RLE and 10 reps LLE for eccentric quad strengthening  Standing near wall but no UE support used - pt performed alternate tap ups to aquatic step 10 reps each leg for improved SLS on each leg  Pt requires buoyancy of water for support for reduced fall risk as pt able to tolerate increased standing and ambulation in water compared to that on land.  Balance exercises able to be safely performed in aquatic environment with buoyancy for support for decreased fall risk compared to land based exercises.  Viscosity of water is needed for resistance for strengthening and current of water provides perturbations for challenge for balance training.     PATIENT EDUCATION: Education details: prognosis, POC, HEP, answered pt's questions on leg press machine, edu on aquatic therapy as pt requests this Person educated: Patient Education method: Explanation, Demonstration, Tactile  cues, Verbal cues, and Handouts Education comprehension: verbalized understanding and returned demonstration  HOME EXERCISE PROGRAM: Access Code: [email or web address] URL: [email or web address] Date: 08/13/2023 Prepared by: [facility] Outpatient  Rehab - [facility]  Exercises - Standing Gastroc Stretch at Counter  - 1 x daily - 5 x weekly - 2 sets - 30 sec hold - Seated Hamstring Stretch  - 1 x daily - 5 x weekly - 2 sets - 30 sec hold - Standing Hip Abduction with Counter Support  - 1 x daily - 5 x weekly - 2 sets - 10 reps - Standing Hip Extension with Counter Support  - 1 x daily - 5 x weekly - 2 sets - 10 reps - Mini Squat with Counter Support  - 1 x daily - 5 x weekly - 2 sets - 10 reps  GOALS: Goals reviewed with patient? Yes   SHORT TERM GOALS: Target date: 09/03/2023>UPDATED 10/26/2023  Patient to be independent with initial HEP. Baseline: HEP initiated Goal status: MET12/07/2023  Pt will perform Condition 4 on MCTSIB for 30 seconds moderate or less sway Baseline:  Severe sway 20 sec Goal status:  INITIAL    LONG TERM GOALS: Target date: +09/24/2023>UPDATED TARGET 11/09/2023  Patient to be independent with advanced HEP. Baseline: Not yet initiated  Goal status: IN PROGRESS  Patient to demonstrate B LE strength >/=4+/5.  Baseline: See above Goal status: IN PROGRESS 09/28/2023  Patient to demonstrate L ankle dorsiflexion AROM to at least 14 deg in order to improve efficiency of gait.   Baseline: 10 deg>15 degrees Goal status: MET 09/28/2023  Patient to demonstrate safe stair navigation with alternating reciprocal pattern and no UE support.  Baseline: alternating reciprocal pattern with 1-2 UE support required ; continues to need UE support and alt pattern Goal status: IN PROGRESS 09/28/2023  Patient to score at least 20/24 on DGI in order  to decrease risk of falls.  Baseline: 17>21/24 Goal status: MET1/23/2025  Patient to demonstrate 25% improvement in  R-ward trunk lean with gait.  Baseline: significant R lean>slight improvement per report 09/28/2023 Goal status: IN PROGRESS 09/28/2023  Pt will improve FGA score to 22/30 for decreased fall risk. Baseline:  19/30 Goal status:  INITIAL  Pt will report ability to push wife's wheelchair up and down ramp into home. Baseline:  Currently not able to do; nurse has to help Goal Status:  INITIAL   ASSESSMENT:  CLINICAL IMPRESSION: Session initaited with pushing w/c against therapist resistance to improve capacity to push spouse in w/c up ramp.  Instructed in activities for static, multisensory balance combining challenges with upper body resistance for conditioning and postural perturbations.  HEP review with increase in resistance applied and tolerated well.  Dynamic gait activities to improve safety with mobility and ambulation under precarious circumstances with decrease in gait speed as result of challenges and some lateral deviation.  Notes right quad fatigue/tension towards end of session and reports this LE more affected y hx of neuropathy than left. Continued sessions to advance POC details to improve mobility and activity tolerance   OBJECTIVE IMPAIRMENTS: Abnormal gait, decreased balance, difficulty walking, decreased ROM, decreased strength, impaired flexibility, impaired sensation, postural dysfunction, and pain.   ACTIVITY LIMITATIONS: carrying, lifting, bending, standing, squatting, stairs, transfers, bathing, toileting, dressing, locomotion level, and caring for others  PARTICIPATION LIMITATIONS: meal prep, cleaning, laundry, shopping, community activity, occupation, yard work, and church  PERSONAL FACTORS: Age, Past/current experiences, Time since onset of injury/illness/exacerbation, and 3+ comorbidities: HFrEF, atrial fibrillation, VF cardiac arrest s/p ICD, CAD, severe aortic regurgitation s/p AVR 10/2018 and aortic root replacement with CABGx1, aortic dilation, hypertension,  hypothyroidism, type 2 diabetes, multiple myeloma, CKD IIIb-IV, migraine  are also affecting patient's functional outcome.   REHAB POTENTIAL: Good  CLINICAL DECISION MAKING: Evolving/moderate complexity  EVALUATION COMPLEXITY: Moderate  PLAN:  PT FREQUENCY: 2x/week  PT DURATION: 6 weeks  PLANNED INTERVENTIONS: 97164- PT Re-evaluation, 97110-Therapeutic exercises, 97530- Therapeutic activity, 97112- Neuromuscular re-education, 97535- Self Care, [id number]- Manual therapy, [phone]- Gait training, [phone]- Canalith repositioning, [phone]- Aquatic Therapy, Patient/Family education, Balance training, Stair training, Taping, Dry Needling, Joint mobilization, Spinal mobilization, Vestibular training, DME instructions, Cryotherapy, and Moist heat   PLAN FOR NEXT SESSION:  Warm up with recumbent bike for RLE flexibility; progress Bilat hip and R knee strength, stairs-step ups, step downs, step taps (consider adding to HEP), balance challenges on level/unlevel surfaces, treadmill for incline/decline   AQUATICS: Frequency: 1x/wk Duration: 6 wks Special Instruction: R LE strengthening, balance    9:33 AM, 10/05/23 M. Kelly Chela Sutphen, PT, DPT Physical Therapist- [place] Office Number: [phone]

## 2023-10-08 ENCOUNTER — Ambulatory Visit: Payer: Medicare Other | Admitting: Physical Therapy

## 2023-10-08 ENCOUNTER — Encounter: Payer: Self-pay | Admitting: Physical Therapy

## 2023-10-08 DIAGNOSIS — M6281 Muscle weakness (generalized): Secondary | ICD-10-CM | POA: Diagnosis not present

## 2023-10-08 DIAGNOSIS — R2689 Other abnormalities of gait and mobility: Secondary | ICD-10-CM | POA: Diagnosis not present

## 2023-10-08 DIAGNOSIS — R2681 Unsteadiness on feet: Secondary | ICD-10-CM | POA: Diagnosis not present

## 2023-10-08 NOTE — Therapy (Signed)
 OUTPATIENT PHYSICAL THERAPY/AQUATIC THERAPY TREATMENT NOTE   Patient Name: Eric Gura., MD MRN: [id number] DOB:17-Sep-1951, 73 y.o., male Today's Date: 10/08/2023   PCP:    Jarold Medici, MD   REFERRING PROVIDER: Raford Riggs, MD   END OF SESSION:  PT End of Session - 10/08/23 2031     Visit Number 13    Number of Visits 22    Date for PT Re-Evaluation 11/09/23    Authorization Type Medicare/[employer]    PT Start Time 1450    PT Stop Time 1530    PT Time Calculation (min) 40 min    Equipment Utilized During Treatment Other (comment)   aquatic step, barbells   Activity Tolerance Patient tolerated treatment well    Behavior During Therapy WFL for tasks assessed/performed             Past Medical History:  Diagnosis Date   Acute blood loss anemia    Acute encephalopathy    Acute hypoxemic respiratory failure (HCC)    Acute idiopathic gout of right ankle    Acute lumbar back pain    Acute on chronic systolic heart failure (HCC)    Acute respiratory failure (HCC)    AKI (acute kidney injury) (HCC)    Altered mental status    Aortic aneurysm without rupture (HCC) 02/09/2016   Aortic root enlargement (HCC) 02/13/2012   Aortic valve regurgitation, acquired 02/13/2012   Arrhythmia    Atrial flutter (HCC) 03/18/2012   Back pain    Cardiac arrest (HCC) 02/01/2016   CHF (congestive heart failure) (HCC)    Chronic anticoagulation 03/04/2013   Chronic combined systolic and diastolic heart failure (HCC)    Chronic kidney disease    kidney fx studies increased    Chronic lower back pain    Chronic renal insufficiency    Chronic systolic heart failure (HCC) 02/13/2012   Recent diagnosis 4 / 2013, LVEF 25% by Echo 12/2011  03/2013: Echo at [facility] Cardiology Conclusions: 1. Left ventricular ejection fraction estimated by 2D at 40-45 percent. 2. Mild concentric left ventricular hypertrophy. 3. Mild left atrial enlargement. 4. Moderate aortic valve regurgitation. 5. The  aortic root at the sinus(es) of valsalva is moderately dilated 6. Mild mitral valve regurgitation. 7.   CKD (chronic kidney disease)    CKD (chronic kidney disease), stage IV (HCC) 08/06/2013   Creatinine 2.4 on 07/04/13    Claustrophobia    Colon cancer screening 03/04/2013   Debility 02/22/2016   Diabetes mellitus type 2 in nonobese [facility])    Diabetes mellitus type 2 in obese    Dysrhythmia    palpitations   Encounter for central line placement    Exertional dyspnea 01/2012   Femoral nerve injury 02/22/2016   Femoral neuropathy    Goals of care, counseling/discussion 05/05/2020   HCAP (healthcare-associated pneumonia)    Heart murmur    Hyperlipidemia 02/13/2012   Hypertension    Hypothyroidism    Internal hemorrhoids without mention of complication 04/11/2013   Labile blood pressure    Left bundle branch block 02/13/2012   Leg weakness, bilateral    Long term (current) use of anticoagulants 08/06/2013   Eliquis  therapy    Long term current use of amiodarone  08/07/2016   Lower extremity weakness    Migraine 02/13/2012   opthalmic   Multiple myeloma (HCC) 05/05/2020   Non-traumatic rhabdomyolysis    Obesity (BMI 30-39.9) 02/13/2012   Pain    Paroxysmal atrial fibrillation (HCC)    Pneumonia  Retroperitoneal bleed    Right ankle pain    Right knee pain    Severe aortic regurgitation 02/09/2016   Special screening for malignant neoplasms, colon 04/11/2013   Thyroid  activity decreased    Tibial pain    Varicose vein of leg    right   Ventricular fibrillation (HCC) 02/09/2016   Weakness of both lower extremities    Past Surgical History:  Procedure Laterality Date   AORTIC VALVE REPLACEMENT  11/20/2018   CARDIAC CATHETERIZATION N/A 02/17/2016   Procedure: Left Heart Cath and Coronary Angiography;  Surgeon: Candyce GORMAN Reek, MD;  Location: [facility] INVASIVE CV LAB;  Service: Cardiovascular;  Laterality: N/A;   CARDIOVERSION  03/22/2012   Procedure: CARDIOVERSION;   Surgeon: Oneil Parchment, MD;  Location: [facility] ENDOSCOPY;  Service: Cardiovascular;  Laterality: N/A;   CARDIOVERSION  04/19/2012   Procedure: CARDIOVERSION;  Surgeon: Victory LELON Claudene DOUGLAS, MD;  Location: [facility] OR;  Service: Cardiovascular;  Laterality: N/A;   COLONOSCOPY N/A 04/11/2013   Procedure: COLONOSCOPY;  Surgeon: Lamar JONETTA Aho, MD;  Location: [facility] ENDOSCOPY;  Service: Endoscopy;  Laterality: N/A;   COLONOSCOPY N/A 04/11/2013   Procedure: COLONOSCOPY;  Surgeon: Lamar JONETTA Aho, MD;  Location: [facility] ENDOSCOPY;  Service: Endoscopy;  Laterality: N/A;   EP IMPLANTABLE DEVICE N/A 02/17/2016   Procedure: BiV ICD Insertion CRT-D;  Surgeon: Danelle LELON Birmingham, MD;  Location: [facility] INVASIVE CV LAB;  Service: Cardiovascular;  Laterality: N/A;   FINGER SURGERY  2012   4th digit right hand; thumb on left hand   RADIOLOGY WITH ANESTHESIA N/A 02/11/2016   Procedure: MRI OF THE BRAIN WITHOUT CONTRAST, LUMBAR WITHOUT CONTRAST;  Surgeon: Medication Radiologist, MD;  Location: [facility] OR;  Service: Radiology;  Laterality: N/A;  DR. WOOD/MRI   RIGHT/LEFT HEART CATH AND CORONARY ANGIOGRAPHY N/A 07/26/2018   Procedure: RIGHT/LEFT HEART CATH AND CORONARY ANGIOGRAPHY;  Surgeon: Claudene Victory LELON, MD;  Location: [facility] INVASIVE CV LAB;  Service: Cardiovascular;  Laterality: N/A;   Skin melanocytoma excision  2012   above left clavicle   STERNAL INCISION RECLOSURE  11/2018   STERNAL WIRE REMOVAL  11/2018   STERNAL WOUND DEBRIDEMENT  11/2018   TEE WITHOUT CARDIOVERSION  03/22/2012   Procedure: TRANSESOPHAGEAL ECHOCARDIOGRAM (TEE);  Surgeon: Oneil Parchment, MD;  Location: [facility] ENDOSCOPY;  Service: Cardiovascular;  Laterality: N/A;   TEE WITHOUT CARDIOVERSION N/A 02/08/2016   Procedure: TRANSESOPHAGEAL ECHOCARDIOGRAM (TEE);  Surgeon: Redell GORMAN Shallow, MD;  Location: [facility] ENDOSCOPY;  Service: Cardiovascular;  Laterality: N/A;   Patient Active Problem List   Diagnosis Date Noted   Enlarged prostate 09/10/2023   Dyslipidemia due to type 2 diabetes mellitus  (HCC) 09/10/2023   Acquired thrombophilia (HCC) 05/14/2023   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   Goals of care, counseling/discussion 05/05/2020   Multiple myeloma (HCC) 05/05/2020   S/P flap graft 01/07/2019   Coronary artery disease 01/07/2019   Long term (current) use of antibiotics 01/03/2019   Essential hypertension 12/27/2018   Postoperative infection of wound of sternum 12/27/2018   Decreased strength, endurance, and mobility 12/27/2018   Cardiac LV ejection fraction 30-35% 12/27/2018   Decreased activities of daily living (ADL) 12/27/2018   Klebsiella infection 12/27/2018   S/P AVR (aortic valve replacement) 11/23/2018   S/P ascending aortic aneurysm repair 11/23/2018   NICM (nonischemic cardiomyopathy) (HCC) 11/19/2018   AICD (automatic cardioverter/defibrillator) present 11/19/2018   Other symptoms and signs involving the musculoskeletal system 05/08/2018   Long term current use of amiodarone  08/07/2016   Type  2 diabetes mellitus with stage 3b chronic kidney disease, without long-term current use of insulin  (HCC)    Femoral neuropathy    Leg weakness, bilateral    Paroxysmal atrial fibrillation (HCC)    Ventricular fibrillation (HCC) 02/09/2016   Cardiac arrest (HCC) 02/01/2016   Thyroid  activity decreased    Arrhythmia    CKD (chronic kidney disease), stage IV (HCC) 08/06/2013    Class: Chronic   Long term current use of anticoagulant 03/04/2013   Atrial flutter (HCC) 03/18/2012    Class: Acute   Chronic systolic heart failure (HCC) 02/13/2012    Class: Acute   Hypertension, accelerated 02/13/2012   Hyperlipidemia 02/13/2012   Left bundle branch block 02/13/2012   Obesity (BMI 30-39.9) 02/13/2012    ONSET DATE: quite some time reports years   REFERRING DIAG:  G62.9 (ICD-10-CM) - Neuropathy Reprot THERAPY DIAG:  Unsteadiness on feet  Muscle weakness (generalized)  Other abnormalities of gait and mobility  Rationale for Evaluation and  Treatment: Rehabilitation  SUBJECTIVE:                                                                                                                                                                                             SUBJECTIVE STATEMENT: Pt reports he is tired today - states his wife is in rehab at [facility] and he has been up since 5:30 this morning to be with her - says she has her days and nights confused   Pt accompanied by: self  PERTINENT HISTORY: HFrEF, atrial fibrillation, VF cardiac arrest s/p ICD, CAD, severe aortic regurgitation s/p AVR 10/2018 and aortic root replacement with CABGx1, aortic dilation, hypertension, hypothyroidism, type 2 diabetes, multiple myeloma, CKD IIIb-IV, migraine  PAIN:  Are you having pain? Yes: NPRS scale: did not rate/10 Pain location: R knee Pain description: tight, stiff  Aggravating factors: reports this is chronic; worse in AM Relieving factors: exercises in bed   PRECAUTIONS: Fall and ICD/Pacemaker  RED FLAGS: None   WEIGHT BEARING RESTRICTIONS: No  FALLS: Has patient fallen in last 6 months? No  LIVING ENVIRONMENT: Lives with: lives with their spouse; caregiver for his spouse and requires ability to transfer her Lives in: House/apartment Stairs:  4 steps to enter; 3 story home Has following equipment at home: Single point cane, Shower bench, and raised commode  PLOF: Independent; caregiver for wife; working part time as Ophthalmologist- requires sitting, standing, walking   PATIENT GOALS: improve R LE strength  OBJECTIVE:  Note: Objective measures were completed at Evaluation unless otherwise noted.  DIAGNOSTIC FINDINGS: none recent  COGNITION: Overall cognitive status: Within functional limits for tasks  assessed   SENSATION: Intact in B LEs to light touch   COORDINATION: Alternating pronation/supination: slightly slowed Alternating toe tap: B WNL Finger to nose: B WNL   POSTURE: rounded shoulders and  forward head   *PALPATION: no TTP in B calf; slightly more soft tissue restriction on L  LOWER EXTREMITY ROM:     Active  Right Eval Left Eval  Hip flexion    Hip extension    Hip abduction    Hip adduction    Hip internal rotation    Hip external rotation    Knee flexion    Knee extension    Ankle dorsiflexion 14 *c/o pain in calf  10  Ankle plantarflexion    Ankle inversion    Ankle eversion     (Blank rows = not tested)  LOWER EXTREMITY MMT:    MMT Right Eval Left Eval  Hip flexion 4 4+  Hip extension    Hip abduction 4 4  Hip adduction 4 4  Hip internal rotation    Hip external rotation    Knee flexion 4 4+  Knee extension 4 4+  Ankle dorsiflexion 4+ 4+  Ankle plantarflexion 4+ 4+  Ankle inversion    Ankle eversion    (Blank rows = not tested)   GAIT: Gait pattern: Significant R-ward trunk lean with gait; slightly slowed gait speed  Assistive device utilized: None Level of assistance: Modified independence   FUNCTIONAL TESTS:    TODAY'S TREATMENT:                                                                                                                              DATE: 10-08-23  Aquatic PT at [facility] - pool temp 90 degrees  Patient seen for aquatic therapy today.  Treatment took place in water 3.6 - 4.5 feet deep depending upon activity.  Pt entered and exited the pool via step negotiation with use of hand rails with step by step sequence with supervision.  Warm up; pt performed water walking forwards, backwards and sideways 18' x 2 reps each direction   Stretching - pt performed runner's stretch RLE and LLE 20 sec x 1 rep each leg for hamstring & gastroc stretch; hamstring stretch RLE and LLE with foot on 3rd step in pool for isolated hamstring stretch  Gastroc stretch with bil. Forefeet on pool wall, heels on floor - 20 sec hold x 1 rep each leg  Pt performed marching in place 10 reps - with contralateral UE movement:  marching  forwards/backwards 18' x 2 reps - cues to perform slowly for improved SLS on each leg  Squats x 10 reps bil. LE's: Rt unilateral squats 10 reps:  Lt unilateral squats 10 reps with UE support on pool edge  Performed hip flexion/extension and abduction/adduction each leg - 10 reps each - no resistance used due to c/o fatigue during session   Pt stood on LLE - made circles CW 10  reps and then CCW RLE 10 reps for improved SLS on LLE; stood on RLE - made circles CW and CCW with LLE  reps each direction for improved SLS on RLE - no increased resistance used    Crossovers front 18' x 2 reps; stepping behind 18' x 2 reps for improved coordination and balance - pt held small bar bells for UE support with this activity  Step ups onto aquatic step - 10 reps each LE for quad strengthening  Standing near wall but no UE support used - pt performed alternate tap ups to aquatic step 10 reps each leg for improved SLS on each leg  Pt requires buoyancy of water for support for reduced fall risk as pt able to tolerate increased standing and ambulation in water compared to that on land.  Balance exercises able to be safely performed in aquatic environment with buoyancy for support for decreased fall risk compared to land based exercises.  Viscosity of water is needed for resistance for strengthening and current of water provides perturbations for challenge for balance training.     PATIENT EDUCATION: Education details: prognosis, POC, HEP, answered pt's questions on leg press machine, edu on aquatic therapy as pt requests this Person educated: Patient Education method: Explanation, Demonstration, Tactile cues, Verbal cues, and Handouts Education comprehension: verbalized understanding and returned demonstration  HOME EXERCISE PROGRAM: Access Code: [email or web address] URL: [email or web address] Date: 08/13/2023 Prepared by: [facility] - Outpatient  Rehab - [facility]  Exercises - Standing Gastroc  Stretch at Counter  - 1 x daily - 5 x weekly - 2 sets - 30 sec hold - Seated Hamstring Stretch  - 1 x daily - 5 x weekly - 2 sets - 30 sec hold - Standing Hip Abduction with Counter Support  - 1 x daily - 5 x weekly - 2 sets - 10 reps - Standing Hip Extension with Counter Support  - 1 x daily - 5 x weekly - 2 sets - 10 reps - Mini Squat with Counter Support  - 1 x daily - 5 x weekly - 2 sets - 10 reps  GOALS: Goals reviewed with patient? Yes   SHORT TERM GOALS: Target date: 09/03/2023>UPDATED 10/26/2023  Patient to be independent with initial HEP. Baseline: HEP initiated Goal status: MET12/07/2023  Pt will perform Condition 4 on MCTSIB for 30 seconds moderate or less sway Baseline:  Severe sway 20 sec Goal status:  INITIAL    LONG TERM GOALS: Target date: +09/24/2023>UPDATED TARGET 11/09/2023  Patient to be independent with advanced HEP. Baseline: Not yet initiated  Goal status: IN PROGRESS  Patient to demonstrate B LE strength >/=4+/5.  Baseline: See above Goal status: IN PROGRESS 09/28/2023  Patient to demonstrate L ankle dorsiflexion AROM to at least 14 deg in order to improve efficiency of gait.   Baseline: 10 deg>15 degrees Goal status: MET 09/28/2023  Patient to demonstrate safe stair navigation with alternating reciprocal pattern and no UE support.  Baseline: alternating reciprocal pattern with 1-2 UE support required ; continues to need UE support and alt pattern Goal status: IN PROGRESS 09/28/2023  Patient to score at least 20/24 on DGI in order to decrease risk of falls.  Baseline: 17>21/24 Goal status: MET1/23/2025  Patient to demonstrate 25% improvement in R-ward trunk lean with gait.  Baseline: significant R lean>slight improvement per report 09/28/2023 Goal status: IN PROGRESS 09/28/2023  Pt will improve FGA score to 22/30 for decreased fall risk. Baseline:  19/30 Goal status:  INITIAL  Pt  will report ability to push wife's wheelchair up and down ramp into  home. Baseline:  Currently not able to do; nurse has to help Goal Status:  INITIAL   ASSESSMENT:  CLINICAL IMPRESSION: Aquatic PT session focused on bil. LE strengthening, water walking in various directions and  balance exercises to improve SLS on each leg.  Pt reported fatigue in legs at end of session; no increased resistance was used with standing hip exercises due to c/o fatigue (general body fatigue) during aquatic PT session.  Pt needed intermittent UE support on pool edge for assist with balance with SLS activities on each leg.  Pt reported at end of session that he feels he is getting stronger overall.  Cont with POC.    OBJECTIVE IMPAIRMENTS: Abnormal gait, decreased balance, difficulty walking, decreased ROM, decreased strength, impaired flexibility, impaired sensation, postural dysfunction, and pain.   ACTIVITY LIMITATIONS: carrying, lifting, bending, standing, squatting, stairs, transfers, bathing, toileting, dressing, locomotion level, and caring for others  PARTICIPATION LIMITATIONS: meal prep, cleaning, laundry, shopping, community activity, occupation, yard work, and church  PERSONAL FACTORS: Age, Past/current experiences, Time since onset of injury/illness/exacerbation, and 3+ comorbidities: HFrEF, atrial fibrillation, VF cardiac arrest s/p ICD, CAD, severe aortic regurgitation s/p AVR 10/2018 and aortic root replacement with CABGx1, aortic dilation, hypertension, hypothyroidism, type 2 diabetes, multiple myeloma, CKD IIIb-IV, migraine  are also affecting patient's functional outcome.   REHAB POTENTIAL: Good  CLINICAL DECISION MAKING: Evolving/moderate complexity  EVALUATION COMPLEXITY: Moderate  PLAN:  PT FREQUENCY: 2x/week  PT DURATION: 6 weeks  PLANNED INTERVENTIONS: 97164- PT Re-evaluation, 97110-Therapeutic exercises, 97530- Therapeutic activity, 97112- Neuromuscular re-education, 97535- Self Care, [id number]- Manual therapy, [phone]- Gait training, [phone]- Canalith  repositioning, [phone]- Aquatic Therapy, Patient/Family education, Balance training, Stair training, Taping, Dry Needling, Joint mobilization, Spinal mobilization, Vestibular training, DME instructions, Cryotherapy, and Moist heat   PLAN FOR NEXT SESSION:  Warm up with recumbent bike for RLE flexibility; progress Bilat hip and R knee strength, stairs-step ups, step downs, step taps (consider adding to HEP), balance challenges on level/unlevel surfaces, treadmill for incline/decline   AQUATICS: Frequency: 1x/wk Duration: 6 wks Special Instruction: R LE strengthening, balance    Elvie Kussmaul, PT 10/08/23 8:36 PM

## 2023-10-12 ENCOUNTER — Ambulatory Visit: Payer: Medicare Other | Admitting: Physical Therapy

## 2023-10-15 ENCOUNTER — Ambulatory Visit: Payer: Medicare Other | Admitting: Physical Therapy

## 2023-10-18 NOTE — Therapy (Signed)
OUTPATIENT PHYSICAL THERAPY/AQUATIC THERAPY TREATMENT NOTE   Patient Name: Elliot Meldrum., MD MRN: [id number] DOB:1951-07-15, 73 y.o., male Today's Date: 10/19/2023   PCP:    Dorothyann Peng, MD   REFERRING PROVIDER: Chilton Si, MD   END OF SESSION:  PT End of Session - 10/19/23 0956     Visit Number 14    Number of Visits 22    Date for PT Re-Evaluation 11/09/23    Authorization Type Medicare/[employer]    PT Start Time 0943   pt late   PT Stop Time 1015    PT Time Calculation (min) 32 min    Activity Tolerance Patient tolerated treatment well    Behavior During Therapy [facility] for tasks assessed/performed              Past Medical History:  Diagnosis Date   Acute blood loss anemia    Acute encephalopathy    Acute hypoxemic respiratory failure (HCC)    Acute idiopathic gout of right ankle    Acute lumbar back pain    Acute on chronic systolic heart failure (HCC)    Acute respiratory failure (HCC)    AKI (acute kidney injury) (HCC)    Altered mental status    Aortic aneurysm without rupture (HCC) 02/09/2016   Aortic root enlargement (HCC) 02/13/2012   Aortic valve regurgitation, acquired 02/13/2012   Arrhythmia    Atrial flutter (HCC) 03/18/2012   Back pain    Cardiac arrest (HCC) 02/01/2016   CHF (congestive heart failure) (HCC)    Chronic anticoagulation 03/04/2013   Chronic combined systolic and diastolic heart failure (HCC)    Chronic kidney disease    kidney fx studies increased    Chronic lower back pain    Chronic renal insufficiency    Chronic systolic heart failure (HCC) 02/13/2012   Recent diagnosis 4 / 2013, LVEF 25% by Echo 12/2011  03/2013: Echo at [facility] Conclusions: 1. Left ventricular ejection fraction estimated by 2D at 40-45 percent. 2. Mild concentric left ventricular hypertrophy. 3. Mild left atrial enlargement. 4. Moderate aortic valve regurgitation. 5. The aortic root at the sinus(es) of valsalva is moderately dilated 6. Mild  mitral valve regurgitation. 7.   CKD (chronic kidney disease)    CKD (chronic kidney disease), stage IV (HCC) 08/06/2013   Creatinine 2.4 on 07/04/13    Claustrophobia    Colon cancer screening 03/04/2013   Debility 02/22/2016   Diabetes mellitus type 2 in nonobese [facility])    Diabetes mellitus type 2 in obese    Dysrhythmia    "palpitations"   Encounter for central line placement    Exertional dyspnea 01/2012   Femoral nerve injury 02/22/2016   Femoral neuropathy    Goals of care, counseling/discussion 05/05/2020   HCAP (healthcare-associated pneumonia)    Heart murmur    Hyperlipidemia 02/13/2012   Hypertension    Hypothyroidism    Internal hemorrhoids without mention of complication 04/11/2013   Labile blood pressure    Left bundle branch block 02/13/2012   Leg weakness, bilateral    Long term (current) use of anticoagulants 08/06/2013   Eliquis therapy    Long term current use of amiodarone 08/07/2016   Lower extremity weakness    Migraine 02/13/2012   "opthalmic"   Multiple myeloma (HCC) 05/05/2020   Non-traumatic rhabdomyolysis    Obesity (BMI 30-39.9) 02/13/2012   Pain    Paroxysmal atrial fibrillation (HCC)    Pneumonia    Retroperitoneal bleed    Right ankle  pain    Right knee pain    Severe aortic regurgitation 02/09/2016   Special screening for malignant neoplasms, colon 04/11/2013   Thyroid activity decreased    Tibial pain    Varicose vein of leg    right   Ventricular fibrillation (HCC) 02/09/2016   Weakness of both lower extremities    Past Surgical History:  Procedure Laterality Date   AORTIC VALVE REPLACEMENT  11/20/2018   CARDIAC CATHETERIZATION N/A 02/17/2016   Procedure: Left Heart Cath and Coronary Angiography;  Surgeon: Corky Crafts, MD;  Location: [facility] INVASIVE CV LAB;  Service: Cardiovascular;  Laterality: N/A;   CARDIOVERSION  03/22/2012   Procedure: CARDIOVERSION;  Surgeon: Donato Schultz, MD;  Location: [facility] ENDOSCOPY;  Service:  Cardiovascular;  Laterality: N/A;   CARDIOVERSION  04/19/2012   Procedure: CARDIOVERSION;  Surgeon: Lesleigh Noe, MD;  Location: [facility] OR;  Service: Cardiovascular;  Laterality: N/A;   COLONOSCOPY N/A 04/11/2013   Procedure: COLONOSCOPY;  Surgeon: Louis Meckel, MD;  Location: [facility] ENDOSCOPY;  Service: Endoscopy;  Laterality: N/A;   COLONOSCOPY N/A 04/11/2013   Procedure: COLONOSCOPY;  Surgeon: Louis Meckel, MD;  Location: [facility] ENDOSCOPY;  Service: Endoscopy;  Laterality: N/A;   EP IMPLANTABLE DEVICE N/A 02/17/2016   Procedure: BiV ICD Insertion CRT-D;  Surgeon: Marinus Maw, MD;  Location: [facility] INVASIVE CV LAB;  Service: Cardiovascular;  Laterality: N/A;   FINGER SURGERY  2012   "4th digit right hand; thumb on left hand"   RADIOLOGY WITH ANESTHESIA N/A 02/11/2016   Procedure: MRI OF THE BRAIN WITHOUT CONTRAST, LUMBAR WITHOUT CONTRAST;  Surgeon: Medication Radiologist, MD;  Location: [facility] OR;  Service: Radiology;  Laterality: N/A;  DR. WOOD/MRI   RIGHT/LEFT HEART CATH AND CORONARY ANGIOGRAPHY N/A 07/26/2018   Procedure: RIGHT/LEFT HEART CATH AND CORONARY ANGIOGRAPHY;  Surgeon: Lyn Records, MD;  Location: [facility] INVASIVE CV LAB;  Service: Cardiovascular;  Laterality: N/A;   Skin melanocytoma excision  2012   "above left clavicle"   STERNAL INCISION RECLOSURE  11/2018   STERNAL WIRE REMOVAL  11/2018   STERNAL WOUND DEBRIDEMENT  11/2018   TEE WITHOUT CARDIOVERSION  03/22/2012   Procedure: TRANSESOPHAGEAL ECHOCARDIOGRAM (TEE);  Surgeon: Donato Schultz, MD;  Location: [facility] ENDOSCOPY;  Service: Cardiovascular;  Laterality: N/A;   TEE WITHOUT CARDIOVERSION N/A 02/08/2016   Procedure: TRANSESOPHAGEAL ECHOCARDIOGRAM (TEE);  Surgeon: Lewayne Bunting, MD;  Location: [facility] ENDOSCOPY;  Service: Cardiovascular;  Laterality: N/A;   Patient Active Problem List   Diagnosis Date Noted   Enlarged prostate 09/10/2023   Dyslipidemia due to type 2 diabetes mellitus (HCC) 09/10/2023   Acquired thrombophilia (HCC) 05/14/2023    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   Goals of care, counseling/discussion 05/05/2020   Multiple myeloma (HCC) 05/05/2020   S/P flap graft 01/07/2019   Coronary artery disease 01/07/2019   Long term (current) use of antibiotics 01/03/2019   Essential hypertension 12/27/2018   Postoperative infection of wound of sternum 12/27/2018   Decreased strength, endurance, and mobility 12/27/2018   Cardiac LV ejection fraction 30-35% 12/27/2018   Decreased activities of daily living (ADL) 12/27/2018   Klebsiella infection 12/27/2018   S/P AVR (aortic valve replacement) 11/23/2018   S/P ascending aortic aneurysm repair 11/23/2018   NICM (nonischemic cardiomyopathy) (HCC) 11/19/2018   AICD (automatic cardioverter/defibrillator) present 11/19/2018   Other symptoms and signs involving the musculoskeletal system 05/08/2018   Long term current use of amiodarone 08/07/2016   Type 2 diabetes mellitus with stage 3b chronic  kidney disease, without long-term current use of insulin (HCC)    Femoral neuropathy    Leg weakness, bilateral    Paroxysmal atrial fibrillation (HCC)    Ventricular fibrillation (HCC) 02/09/2016   Cardiac arrest (HCC) 02/01/2016   Thyroid activity decreased    Arrhythmia    CKD (chronic kidney disease), stage IV (HCC) 08/06/2013    Class: Chronic   Long term current use of anticoagulant 03/04/2013   Atrial flutter (HCC) 03/18/2012    Class: Acute   Chronic systolic heart failure (HCC) 02/13/2012    Class: Acute   Hypertension, accelerated 02/13/2012   Hyperlipidemia 02/13/2012   Left bundle branch block 02/13/2012   Obesity (BMI 30-39.9) 02/13/2012    ONSET DATE: "quite some time" reports years   REFERRING DIAG:  G62.9 (ICD-10-CM) - Neuropathy Reprot THERAPY DIAG:  Unsteadiness on feet  Muscle weakness (generalized)  Other abnormalities of gait and mobility  Rationale for Evaluation and Treatment: Rehabilitation  SUBJECTIVE:                                                                                                                                                                                              SUBJECTIVE STATEMENT: Had an upper respiratory something- feeling better but still getting over it. Denies pain, "just very tight in the legs." Wife was admitted to hospital yesterday and put on observation.    Pt accompanied by: self  PERTINENT HISTORY: HFrEF, atrial fibrillation, VF cardiac arrest s/p ICD, CAD, severe aortic regurgitation s/p AVR 10/2018 and aortic root replacement with CABGx1, aortic dilation, hypertension, hypothyroidism, type 2 diabetes, multiple myeloma, CKD IIIb-IV, migraine  PAIN:  Are you having pain? Yes: NPRS scale: 0/10 Pain location: R knee Pain description: tight, stiff  Aggravating factors: reports this is chronic; worse in AM Relieving factors: exercises in bed   PRECAUTIONS: Fall and ICD/Pacemaker  RED FLAGS: None   WEIGHT BEARING RESTRICTIONS: No  FALLS: Has patient fallen in last 6 months? No  LIVING ENVIRONMENT: Lives with: lives with their spouse; caregiver for his spouse and requires ability to transfer her Lives in: House/apartment Stairs:  4 steps to enter; 3 story home Has following equipment at home: Single point cane, Shower bench, and raised commode  PLOF: Independent; caregiver for wife; working part time as Ophthalmologist- requires sitting, standing, walking   PATIENT GOALS: improve R LE strength  OBJECTIVE:    TODAY'S TREATMENT: 10/19/2023 Activity Comments  recumbent bike L1 x 6 min Self-selected pace d/t pt not feeling fully recovered. Pt denies SOB. 88% spO2, 55bpm at beginning of warmup  after warmup 91% spO2, 78bpm  Sitting HS stretch 30" each To address c/o tightness in LEs   Runner's stretch 30" each   standing hip ABD 2x15 each LE Trunk flexion   standing hip EX 2x15 each LE Cues to maintain tall posture, look ahead   backwards walking, walking EC,  tandem walk along counter  Occasional UE support as needed   Sitting KTOS and fig 4 30" each Active rest breaks between hip strengthening exercises  97spO2, 69bpm  Vitals at end of session 98% spO2, 63 bpm              Note: Objective measures were completed at Evaluation unless otherwise noted.  DIAGNOSTIC FINDINGS: none recent  COGNITION: Overall cognitive status: Within functional limits for tasks assessed   SENSATION: Intact in B LEs to light touch   COORDINATION: Alternating pronation/supination: slightly slowed Alternating toe tap: B WNL Finger to nose: B WNL   POSTURE: rounded shoulders and forward head   *PALPATION: no TTP in B calf; slightly more soft tissue restriction on L  LOWER EXTREMITY ROM:     Active  Right Eval Left Eval  Hip flexion    Hip extension    Hip abduction    Hip adduction    Hip internal rotation    Hip external rotation    Knee flexion    Knee extension    Ankle dorsiflexion 14 *c/o pain in calf  10  Ankle plantarflexion    Ankle inversion    Ankle eversion     (Blank rows = not tested)  LOWER EXTREMITY MMT:    MMT Right Eval Left Eval  Hip flexion 4 4+  Hip extension    Hip abduction 4 4  Hip adduction 4 4  Hip internal rotation    Hip external rotation    Knee flexion 4 4+  Knee extension 4 4+  Ankle dorsiflexion 4+ 4+  Ankle plantarflexion 4+ 4+  Ankle inversion    Ankle eversion    (Blank rows = not tested)   GAIT: Gait pattern: Significant R-ward trunk lean with gait; slightly slowed gait speed  Assistive device utilized: None Level of assistance: Modified independence   FUNCTIONAL TESTS:    TODAY'S TREATMENT:                                                                                                                              DATE:   PATIENT EDUCATION: Education details: prognosis, POC, HEP, answered pt's questions on leg press machine, edu on aquatic therapy as pt requests this Person  educated: Patient Education method: Explanation, Demonstration, Tactile cues, Verbal cues, and Handouts Education comprehension: verbalized understanding and returned demonstration  HOME EXERCISE PROGRAM: Access Code: [email or web address] URL: [email or web address] Date: 08/13/2023 Prepared by: [facility] - Outpatient  Rehab - [facility]  Exercises - Standing Gastroc Stretch at Counter  - 1 x daily - 5 x weekly - 2 sets - 30 sec hold - Seated Hamstring Stretch  -  1 x daily - 5 x weekly - 2 sets - 30 sec hold - Standing Hip Abduction with Counter Support  - 1 x daily - 5 x weekly - 2 sets - 10 reps - Standing Hip Extension with Counter Support  - 1 x daily - 5 x weekly - 2 sets - 10 reps - Mini Squat with Counter Support  - 1 x daily - 5 x weekly - 2 sets - 10 reps  GOALS: Goals reviewed with patient? Yes   SHORT TERM GOALS: Target date: 09/03/2023>UPDATED 10/26/2023  Patient to be independent with initial HEP. Baseline: HEP initiated Goal status: MET12/07/2023  Pt will perform Condition 4 on MCTSIB for 30 seconds moderate or less sway Baseline:  Severe sway 20 sec Goal status:  INITIAL    LONG TERM GOALS: Target date: +09/24/2023>UPDATED TARGET 11/09/2023  Patient to be independent with advanced HEP. Baseline: Not yet initiated  Goal status: IN PROGRESS  Patient to demonstrate B LE strength >/=4+/5.  Baseline: See above Goal status: IN PROGRESS 09/28/2023  Patient to demonstrate L ankle dorsiflexion AROM to at least 14 deg in order to improve efficiency of gait.   Baseline: 10 deg>15 degrees Goal status: MET 09/28/2023  Patient to demonstrate safe stair navigation with alternating reciprocal pattern and no UE support.  Baseline: alternating reciprocal pattern with 1-2 UE support required ; continues to need UE support and alt pattern Goal status: IN PROGRESS 09/28/2023  Patient to score at least 20/24 on DGI in order to decrease risk of falls.  Baseline:  17>21/24 Goal status: MET1/23/2025  Patient to demonstrate 25% improvement in R-ward trunk lean with gait.  Baseline: significant R lean>slight improvement per report 09/28/2023 Goal status: IN PROGRESS 09/28/2023  Pt will improve FGA score to 22/30 for decreased fall risk. Baseline:  19/30 Goal status:  INITIAL  Pt will report ability to push wife's wheelchair up and down ramp into home. Baseline:  Currently not able to do; nurse has to help Goal Status:  INITIAL   ASSESSMENT:  CLINICAL IMPRESSION: Patient arrived to session with report of getting over an URI and not fully recovered. Began with light resistance warm up for LEs; vitals were monitored and showed low spO2 and HR (pt with extensive cardiac history) but denied SOB. Recheck after warm up showed improved values and no complaints. Proceeded with LE stretching and strengthening with cueing as needed for posture. Also performed dynamic balance activities, utilizing light UE support as needed. Patient tolerated session well, taking active rest breaks for stretching as needed. No complaints at end of appointment.    OBJECTIVE IMPAIRMENTS: Abnormal gait, decreased balance, difficulty walking, decreased ROM, decreased strength, impaired flexibility, impaired sensation, postural dysfunction, and pain.   ACTIVITY LIMITATIONS: carrying, lifting, bending, standing, squatting, stairs, transfers, bathing, toileting, dressing, locomotion level, and caring for others  PARTICIPATION LIMITATIONS: meal prep, cleaning, laundry, shopping, community activity, occupation, yard work, and church  PERSONAL FACTORS: Age, Past/current experiences, Time since onset of injury/illness/exacerbation, and 3+ comorbidities: HFrEF, atrial fibrillation, VF cardiac arrest s/p ICD, CAD, severe aortic regurgitation s/p AVR 10/2018 and aortic root replacement with CABGx1, aortic dilation, hypertension, hypothyroidism, type 2 diabetes, multiple myeloma, CKD IIIb-IV,  migraine  are also affecting patient's functional outcome.   REHAB POTENTIAL: Good  CLINICAL DECISION MAKING: Evolving/moderate complexity  EVALUATION COMPLEXITY: Moderate  PLAN:  PT FREQUENCY: 2x/week  PT DURATION: 6 weeks  PLANNED INTERVENTIONS: 97164- PT Re-evaluation, 97110-Therapeutic exercises, 97530- Therapeutic activity, [id number]- Neuromuscular re-education, 97535- Self Care, [id number]-  Manual therapy, [id number]- Gait training, 16109- Canalith repositioning, [id number]- Aquatic Therapy, Patient/Family education, Balance training, Stair training, Taping, Dry Needling, Joint mobilization, Spinal mobilization, Vestibular training, DME instructions, Cryotherapy, and Moist heat   PLAN FOR NEXT SESSION:  Warm up with recumbent bike for RLE flexibility; progress Bilat hip and R knee strength, stairs-step ups, step downs, step taps (consider adding to HEP), balance challenges on level/unlevel surfaces, treadmill for incline/decline   AQUATICS: Frequency: 1x/wk Duration: 6 wks Special Instruction: R LE strengthening, balance      Baldemar Friday, PT, DPT 10/19/23 11:04 AM  [facility] at [facility] [address] [place], [place] [address] Phone # [phone] Fax # [phone]

## 2023-10-19 ENCOUNTER — Ambulatory Visit: Payer: Medicare Other | Admitting: Physical Therapy

## 2023-10-19 ENCOUNTER — Inpatient Hospital Stay: Payer: Medicare Other | Attending: Hematology & Oncology

## 2023-10-19 ENCOUNTER — Encounter: Payer: Self-pay | Admitting: Physical Therapy

## 2023-10-19 ENCOUNTER — Encounter: Payer: Self-pay | Admitting: Hematology & Oncology

## 2023-10-19 ENCOUNTER — Inpatient Hospital Stay (HOSPITAL_BASED_OUTPATIENT_CLINIC_OR_DEPARTMENT_OTHER): Payer: Medicare Other | Admitting: Hematology & Oncology

## 2023-10-19 VITALS — BP 120/71 | HR 70 | Temp 98.7°F | Resp 18 | Ht 68.0 in | Wt 196.0 lb

## 2023-10-19 DIAGNOSIS — R2681 Unsteadiness on feet: Secondary | ICD-10-CM

## 2023-10-19 DIAGNOSIS — M6281 Muscle weakness (generalized): Secondary | ICD-10-CM

## 2023-10-19 DIAGNOSIS — R079 Chest pain, unspecified: Secondary | ICD-10-CM | POA: Insufficient documentation

## 2023-10-19 DIAGNOSIS — R2689 Other abnormalities of gait and mobility: Secondary | ICD-10-CM

## 2023-10-19 DIAGNOSIS — M7989 Other specified soft tissue disorders: Secondary | ICD-10-CM | POA: Diagnosis not present

## 2023-10-19 DIAGNOSIS — Z79899 Other long term (current) drug therapy: Secondary | ICD-10-CM | POA: Insufficient documentation

## 2023-10-19 DIAGNOSIS — C9000 Multiple myeloma not having achieved remission: Secondary | ICD-10-CM | POA: Insufficient documentation

## 2023-10-19 LAB — CBC WITH DIFFERENTIAL (CANCER CENTER ONLY)
Abs Immature Granulocytes: 0.03 10*3/uL (ref 0.00–0.07)
Basophils Absolute: 0 10*3/uL (ref 0.0–0.1)
Basophils Relative: 1 %
Eosinophils Absolute: 0.2 10*3/uL (ref 0.0–0.5)
Eosinophils Relative: 4 %
HCT: 39.7 % (ref 39.0–52.0)
Hemoglobin: 13.2 g/dL (ref 13.0–17.0)
Immature Granulocytes: 1 %
Lymphocytes Relative: 17 %
Lymphs Abs: 0.8 10*3/uL (ref 0.7–4.0)
MCH: 30.8 pg (ref 26.0–34.0)
MCHC: 33.2 g/dL (ref 30.0–36.0)
MCV: 92.8 fL (ref 80.0–100.0)
Monocytes Absolute: 0.6 10*3/uL (ref 0.1–1.0)
Monocytes Relative: 12 %
Neutro Abs: 3.3 10*3/uL (ref 1.7–7.7)
Neutrophils Relative %: 65 %
Platelet Count: 175 10*3/uL (ref 150–400)
RBC: 4.28 MIL/uL (ref 4.22–5.81)
RDW: 16.6 % — ABNORMAL HIGH (ref 11.5–15.5)
WBC Count: 4.9 10*3/uL (ref 4.0–10.5)
nRBC: 0 % (ref 0.0–0.2)

## 2023-10-19 LAB — CMP (CANCER CENTER ONLY)
ALT: 28 U/L (ref 0–44)
AST: 33 U/L (ref 15–41)
Albumin: 3.6 g/dL (ref 3.5–5.0)
Alkaline Phosphatase: 71 U/L (ref 38–126)
Anion gap: 9 (ref 5–15)
BUN: 33 mg/dL — ABNORMAL HIGH (ref 8–23)
CO2: 25 mmol/L (ref 22–32)
Calcium: 8.5 mg/dL — ABNORMAL LOW (ref 8.9–10.3)
Chloride: 108 mmol/L (ref 98–111)
Creatinine: 2.01 mg/dL — ABNORMAL HIGH (ref 0.61–1.24)
GFR, Estimated: 35 mL/min — ABNORMAL LOW (ref >60)
Glucose, Bld: 124 mg/dL — ABNORMAL HIGH (ref 70–99)
Potassium: 3.7 mmol/L (ref 3.5–5.1)
Sodium: 142 mmol/L (ref 135–145)
Total Bilirubin: 1 mg/dL (ref 0.0–1.2)
Total Protein: 5.5 g/dL — ABNORMAL LOW (ref 6.5–8.1)

## 2023-10-19 LAB — LACTATE DEHYDROGENASE: LDH: 254 U/L — ABNORMAL HIGH (ref 98–192)

## 2023-10-19 NOTE — Progress Notes (Signed)
Hematology and Oncology Follow Up Visit  Eric Ahrendt., Eric [name] [id number] [birth year] 73 y.o. 10/19/2023   Principle Diagnosis:  IgG kappa myeloma  - 1q+   Current Therapy:        CyBorD -- s/p cycle  #19 - started on 06/04/2020 --last treatment in 08/2022 --on hold for now.   Interim History:  Eric [name] is here today for follow-up.  Unfortunate, he was in the ER most of night with his poor wife.  She is having some chest pain.  I really feel bad for her.  I know that he is doing his best to try to help her out.  He is doing okay.  He still working part-time.  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  He has had no problem with infections.  He has had no nausea or vomiting.  He has had no change in bowel or bladder habits.  Overall, the last time that we saw him, there is no monoclonal spike in his blood.  His IgG level was 708 mg/dL.  The Kappa light chain was 2.8 mg/dL.  Currently, I would say that his performance status is probably ECOG 1.     Medications:  Allergies as of 10/19/2023       Reactions   Lactose Diarrhea   Lactose Intolerance (gi) Diarrhea        Medication List        Accurate as of October 19, 2023  1:27 PM. If you have any questions, ask your nurse or doctor.          STOP taking these medications    predniSONE 50 MG tablet Commonly known as: DELTASONE Stopped by: Josph Macho       TAKE these medications    acetaminophen 500 MG tablet Commonly known as: TYLENOL Take 1,000 mg by mouth every 4 (four) hours as needed for moderate pain or headache.   allopurinol 100 MG tablet Commonly known as: Zyloprim Take 1 tablet (100 mg total) by mouth daily.   amiodarone 100 MG tablet Commonly known as: PACERONE Take 1 tablet (100 mg total) by mouth daily.   apixaban 5 MG Tabs tablet Commonly known as: Eliquis Take 1 tablet (5 mg total) by mouth 2 (two) times daily.   ARTIFICIAL TEARS OP Place 1 drop into both eyes daily as  needed (dry eyes).   ascorbic acid 500 MG tablet Commonly known as: VITAMIN C Take 500 mg by mouth daily.   azithromycin 250 MG tablet Commonly known as: ZITHROMAX Take 250 mg by mouth as directed.   carvedilol 6.25 MG tablet Commonly known as: COREG Take 1 tablet by mouth twice a day   famciclovir 500 MG tablet Commonly known as: FAMVIR Take 1 tablet (500 mg total) by mouth daily.   Farxiga 10 MG Tabs tablet Generic drug: dapagliflozin propanediol TAKE 1 TABLET(10 MG) BY MOUTH DAILY BEFORE BREAKFAST   ferrous sulfate 324 (65 Fe) MG Tbec Take 1 tablet by mouth daily.   fluticasone 50 MCG/ACT nasal spray Commonly known as: FLONASE Place 2 sprays into both nostrils daily.   furosemide 80 MG tablet Commonly known as: LASIX Alternate one tablet (80mg ) and half tablet (40mg ) every other day. Okay to take whole tablet as needed for fluid retention, edema.   levothyroxine 75 MCG tablet Commonly known as: SYNTHROID Take 1 tablet (75 mcg total) by mouth daily before breakfast.   loperamide 2 MG capsule Commonly known as: IMODIUM Take 2  mg by mouth as needed for diarrhea or loose stools.   potassium chloride 20 MEQ/15ML (10%) Soln TAKE 15 ML BY MOUTH TWICE DAILY What changed: See the new instructions.   rosuvastatin 20 MG tablet Commonly known as: CRESTOR TAKE 1 TABLET(20 MG) BY MOUTH DAILY   Semaglutide(0.25 or 0.5MG /DOS) 2 MG/3ML Sopn Inject 0.5 into the skin once weekly.   vitamin B-12 100 MCG tablet Commonly known as: CYANOCOBALAMIN Take 100 mcg by mouth daily.   Vitamin D 125 MCG (5000 UT) Caps Take by mouth daily at 6 (six) AM.   cholecalciferol 25 MCG (1000 UNIT) tablet Commonly known as: VITAMIN D3 Take 5,000 Units by mouth daily.        Allergies:  Allergies  Allergen Reactions   Lactose Diarrhea   Lactose Intolerance (Gi) Diarrhea    Past Medical History, Surgical history, Social history, and Family History were reviewed and updated.  Review  of Systems: Review of Systems  Constitutional: Negative.   Eyes: Negative.   Respiratory: Negative.    Cardiovascular:  Positive for leg swelling.  Gastrointestinal: Negative.   Genitourinary: Negative.   Musculoskeletal: Negative.   Skin: Negative.   Neurological: Negative.   Endo/Heme/Allergies: Negative.   Psychiatric/Behavioral: Negative.       Physical Exam:  height is 5\' 8"  (1.727 m) and weight is 196 lb (88.9 kg). His oral temperature is 98.7 F (37.1 C). His blood pressure is 120/71 and his pulse is 70. His respiration is 18 and oxygen saturation is 100%.   Wt Readings from Last 3 Encounters:  10/19/23 196 lb (88.9 kg)  09/12/23 191 lb (86.6 kg)  08/31/23 191 lb (86.6 kg)    Physical Exam Vitals reviewed.  HENT:     Head: Normocephalic and atraumatic.  Eyes:     Pupils: Pupils are equal, round, and reactive to light.  Cardiovascular:     Rate and Rhythm: Normal rate and regular rhythm.     Heart sounds: Normal heart sounds.  Pulmonary:     Effort: Pulmonary effort is normal.     Breath sounds: Normal breath sounds.  Abdominal:     General: Bowel sounds are normal.     Palpations: Abdomen is soft.  Musculoskeletal:        General: No tenderness or deformity. Normal range of motion.     Cervical back: Normal range of motion.  Lymphadenopathy:     Cervical: No cervical adenopathy.  Skin:    General: Skin is warm and dry.     Findings: No erythema or rash.  Neurological:     Mental Status: He is alert and oriented to person, place, and time.  Psychiatric:        Behavior: Behavior normal.        Thought Content: Thought content normal.        Judgment: Judgment normal.     Lab Results  Component Value Date   WBC 4.9 10/19/2023   HGB 13.2 10/19/2023   HCT 39.7 10/19/2023   MCV 92.8 10/19/2023   PLT 175 10/19/2023   Lab Results  Component Value Date   FERRITIN 56 09/15/2022   IRON 92 09/15/2022   TIBC 349 09/15/2022   UIBC 257 09/15/2022    IRONPCTSAT 26 09/15/2022   Lab Results  Component Value Date   RETICCTPCT 1.4 09/15/2022   RBC 4.28 10/19/2023   Lab Results  Component Value Date   KPAFRELGTCHN 28.0 (H) 08/10/2023   LAMBDASER 28.2 (H) 08/10/2023   KAPLAMBRATIO  0.99 08/10/2023   Lab Results  Component Value Date   IGGSERUM 708 08/10/2023   IGA 185 08/10/2023   IGMSERUM 22 08/10/2023   Lab Results  Component Value Date   TOTALPROTELP 5.5 (L) 08/10/2023   ALBUMINELP 3.3 08/10/2023   A1GS 0.2 08/10/2023   A2GS 0.5 08/10/2023   BETS 0.9 08/10/2023   GAMS 0.6 08/10/2023   MSPIKE Not Observed 08/10/2023   SPEI Comment 04/22/2021     Chemistry      Component Value Date/Time   NA 142 10/19/2023 1213   NA 145 (H) 08/31/2023 0946   K 3.7 10/19/2023 1213   CL 108 10/19/2023 1213   CO2 25 10/19/2023 1213   BUN 33 (H) 10/19/2023 1213   BUN 33 (H) 08/31/2023 0946   CREATININE 2.01 (H) 10/19/2023 1213   CREATININE 1.95 (H) 09/08/2016 0859      Component Value Date/Time   CALCIUM 8.5 (L) 10/19/2023 1213   ALKPHOS 71 10/19/2023 1213   AST 33 10/19/2023 1213   ALT 28 10/19/2023 1213   BILITOT 1.0 10/19/2023 1213       Impression and Plan: 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  He has done incredibly well with treatment.  Again, he has been off treatment now for over a year.  His monoclonal studies have all looked pretty stable.  I would not imagine that anything will change right now.  I do feel bad balance for wife.  I know he is doing his best to try to help take care of her.  I will plan to get him back now in April.  I would think that we can get him through the Winter.   Josph Macho, Eric [name] 1/24/20251:27 PM

## 2023-10-19 NOTE — Addendum Note (Signed)
Addended by: Geralyn Flash D on: 10/19/2023 03:09 PM   Modules accepted: Orders

## 2023-10-19 NOTE — Progress Notes (Signed)
Remote ICD transmission.

## 2023-10-21 LAB — IGG, IGA, IGM
IgA: 178 mg/dL (ref 61–437)
IgG (Immunoglobin G), Serum: 636 mg/dL (ref 603–1613)
IgM (Immunoglobulin M), Srm: 25 mg/dL (ref 15–143)

## 2023-10-22 ENCOUNTER — Ambulatory Visit: Payer: Medicare Other | Admitting: Physical Therapy

## 2023-10-22 LAB — KAPPA/LAMBDA LIGHT CHAINS
Kappa free light chain: 44 mg/L — ABNORMAL HIGH (ref 3.3–19.4)
Kappa, lambda light chain ratio: 1.11 (ref 0.26–1.65)
Lambda free light chains: 39.6 mg/L — ABNORMAL HIGH (ref 5.7–26.3)

## 2023-10-23 NOTE — Therapy (Signed)
OUTPATIENT PHYSICAL THERAPY/AQUATIC THERAPY TREATMENT NOTE   Patient Name: Eric [name]., MD MRN: [id number] DOB:[birth year], 73 y.o., male Today's Date: 10/25/2023   PCP:    Dorothyann Peng, MD   REFERRING PROVIDER: Chilton Si, MD   END OF SESSION:  PT End of Session - 10/25/23 0844     Visit Number 15    Number of Visits 22    Date for PT Re-Evaluation 11/09/23    Authorization Type Medicare/[employer]    PT Start Time 0814   pt late   PT Stop Time 0844    PT Time Calculation (min) 30 min    Activity Tolerance Patient tolerated treatment well    Behavior During Therapy [facility] for tasks assessed/performed               Past Medical History:  Diagnosis Date   Acute blood loss anemia    Acute encephalopathy    Acute hypoxemic respiratory failure (HCC)    Acute idiopathic gout of right ankle    Acute lumbar back pain    Acute on chronic systolic heart failure (HCC)    Acute respiratory failure (HCC)    AKI (acute kidney injury) (HCC)    Altered mental status    Aortic aneurysm without rupture (HCC) 02/09/2016   Aortic root enlargement (HCC) 02/13/2012   Aortic valve regurgitation, acquired 02/13/2012   Arrhythmia    Atrial flutter (HCC) 03/18/2012   Back pain    Cardiac arrest (HCC) 02/01/2016   CHF (congestive heart failure) (HCC)    Chronic anticoagulation 03/04/2013   Chronic combined systolic and diastolic heart failure (HCC)    Chronic kidney disease    kidney fx studies increased    Chronic lower back pain    Chronic renal insufficiency    Chronic systolic heart failure (HCC) 02/13/2012   Recent diagnosis 4 / 2013, LVEF 25% by Echo 12/2011  03/2013: Echo at [facility] Conclusions: 1. Left ventricular ejection fraction estimated by 2D at 40-45 percent. 2. Mild concentric left ventricular hypertrophy. 3. Mild left atrial enlargement. 4. Moderate aortic valve regurgitation. 5. The aortic root at the sinus(es) of valsalva is moderately dilated 6. Mild  mitral valve regurgitation. 7.   CKD (chronic kidney disease)    CKD (chronic kidney disease), stage IV (HCC) 08/06/2013   Creatinine 2.4 on 07/04/13    Claustrophobia    Colon cancer screening 03/04/2013   Debility 02/22/2016   Diabetes mellitus type 2 in nonobese [facility])    Diabetes mellitus type 2 in obese    Dysrhythmia    "palpitations"   Encounter for central line placement    Exertional dyspnea 01/2012   Femoral nerve injury 02/22/2016   Femoral neuropathy    Goals of care, counseling/discussion 05/05/2020   HCAP (healthcare-associated pneumonia)    Heart murmur    Hyperlipidemia 02/13/2012   Hypertension    Hypothyroidism    Internal hemorrhoids without mention of complication 04/11/2013   Labile blood pressure    Left bundle branch block 02/13/2012   Leg weakness, bilateral    Long term (current) use of anticoagulants 08/06/2013   Eliquis therapy    Long term current use of amiodarone 08/07/2016   Lower extremity weakness    Migraine 02/13/2012   "opthalmic"   Multiple myeloma (HCC) 05/05/2020   Non-traumatic rhabdomyolysis    Obesity (BMI 30-39.9) 02/13/2012   Pain    Paroxysmal atrial fibrillation (HCC)    Pneumonia    Retroperitoneal bleed    Right  ankle pain    Right knee pain    Severe aortic regurgitation 02/09/2016   Special screening for malignant neoplasms, colon 04/11/2013   Thyroid activity decreased    Tibial pain    Varicose vein of leg    right   Ventricular fibrillation (HCC) 02/09/2016   Weakness of both lower extremities    Past Surgical History:  Procedure Laterality Date   AORTIC VALVE REPLACEMENT  11/20/2018   CARDIAC CATHETERIZATION N/A 02/17/2016   Procedure: Left Heart Cath and Coronary Angiography;  Surgeon: Corky Crafts, MD;  Location: [facility] INVASIVE CV LAB;  Service: Cardiovascular;  Laterality: N/A;   CARDIOVERSION  03/22/2012   Procedure: CARDIOVERSION;  Surgeon: Donato Schultz, MD;  Location: [facility] ENDOSCOPY;  Service:  Cardiovascular;  Laterality: N/A;   CARDIOVERSION  04/19/2012   Procedure: CARDIOVERSION;  Surgeon: Lesleigh Noe, MD;  Location: [facility] OR;  Service: Cardiovascular;  Laterality: N/A;   COLONOSCOPY N/A 04/11/2013   Procedure: COLONOSCOPY;  Surgeon: Louis Meckel, MD;  Location: [facility] ENDOSCOPY;  Service: Endoscopy;  Laterality: N/A;   COLONOSCOPY N/A 04/11/2013   Procedure: COLONOSCOPY;  Surgeon: Louis Meckel, MD;  Location: [facility] ENDOSCOPY;  Service: Endoscopy;  Laterality: N/A;   EP IMPLANTABLE DEVICE N/A 02/17/2016   Procedure: BiV ICD Insertion CRT-D;  Surgeon: Marinus Maw, MD;  Location: [facility] INVASIVE CV LAB;  Service: Cardiovascular;  Laterality: N/A;   FINGER SURGERY  2012   "4th digit right hand; thumb on left hand"   RADIOLOGY WITH ANESTHESIA N/A 02/11/2016   Procedure: MRI OF THE BRAIN WITHOUT CONTRAST, LUMBAR WITHOUT CONTRAST;  Surgeon: Medication Radiologist, MD;  Location: [facility] OR;  Service: Radiology;  Laterality: N/A;  DR. WOOD/MRI   RIGHT/LEFT HEART CATH AND CORONARY ANGIOGRAPHY N/A 07/26/2018   Procedure: RIGHT/LEFT HEART CATH AND CORONARY ANGIOGRAPHY;  Surgeon: Lyn Records, MD;  Location: [facility] INVASIVE CV LAB;  Service: Cardiovascular;  Laterality: N/A;   Skin melanocytoma excision  2012   "above left clavicle"   STERNAL INCISION RECLOSURE  11/2018   STERNAL WIRE REMOVAL  11/2018   STERNAL WOUND DEBRIDEMENT  11/2018   TEE WITHOUT CARDIOVERSION  03/22/2012   Procedure: TRANSESOPHAGEAL ECHOCARDIOGRAM (TEE);  Surgeon: Donato Schultz, MD;  Location: [facility] ENDOSCOPY;  Service: Cardiovascular;  Laterality: N/A;   TEE WITHOUT CARDIOVERSION N/A 02/08/2016   Procedure: TRANSESOPHAGEAL ECHOCARDIOGRAM (TEE);  Surgeon: Lewayne Bunting, MD;  Location: [facility] ENDOSCOPY;  Service: Cardiovascular;  Laterality: N/A;   Patient Active Problem List   Diagnosis Date Noted   Enlarged prostate 09/10/2023   Dyslipidemia due to type 2 diabetes mellitus (HCC) 09/10/2023   Acquired thrombophilia (HCC) 05/14/2023    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   Goals of care, counseling/discussion 05/05/2020   Multiple myeloma (HCC) 05/05/2020   S/P flap graft 01/07/2019   Coronary artery disease 01/07/2019   Long term (current) use of antibiotics 01/03/2019   Essential hypertension 12/27/2018   Postoperative infection of wound of sternum 12/27/2018   Decreased strength, endurance, and mobility 12/27/2018   Cardiac LV ejection fraction 30-35% 12/27/2018   Decreased activities of daily living (ADL) 12/27/2018   Klebsiella infection 12/27/2018   S/P AVR (aortic valve replacement) 11/23/2018   S/P ascending aortic aneurysm repair 11/23/2018   NICM (nonischemic cardiomyopathy) (HCC) 11/19/2018   AICD (automatic cardioverter/defibrillator) present 11/19/2018   Other symptoms and signs involving the musculoskeletal system 05/08/2018   Long term current use of amiodarone 08/07/2016   Type 2 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 disease, without long-term current use of insulin (HCC)    Femoral neuropathy    Leg weakness, bilateral    Paroxysmal atrial fibrillation (HCC)    Ventricular fibrillation (HCC) 02/09/2016   Cardiac arrest (HCC) 02/01/2016   Thyroid activity decreased    Arrhythmia    CKD (chronic kidney disease), stage IV (HCC) 08/06/2013    Class: Chronic   Long term current use of anticoagulant 03/04/2013   Atrial flutter (HCC) 03/18/2012    Class: Acute   Chronic systolic heart failure (HCC) 02/13/2012    Class: Acute   Hypertension, accelerated 02/13/2012   Hyperlipidemia 02/13/2012   Left bundle branch block 02/13/2012   Obesity (BMI 30-39.9) 02/13/2012    ONSET DATE: "quite some time" reports years   REFERRING DIAG:  G62.9 (ICD-10-CM) - Neuropathy Reprot THERAPY DIAG:  Unsteadiness on feet  Muscle weakness (generalized)  Other abnormalities of gait and mobility  Rationale for Evaluation and Treatment: Rehabilitation  SUBJECTIVE:                                                                                                                                                                                              SUBJECTIVE STATEMENT: Was surprised that his appointment was on a Thursday. Feeling a little better than last session. Wife is still in the hospital.    Pt accompanied by: self  PERTINENT HISTORY: HFrEF, atrial fibrillation, VF cardiac arrest s/p ICD, CAD, severe aortic regurgitation s/p AVR 10/2018 and aortic root replacement with CABGx1, aortic dilation, hypertension, hypothyroidism, type 2 diabetes, multiple myeloma, CKD IIIb-IV, migraine  PAIN:  Are you having pain? Yes: NPRS scale: "no pain, just the tightness in the R thigh"/10 Pain location: R knee Pain description: tight, stiff  Aggravating factors: reports this is chronic; worse in AM Relieving factors: exercises in bed   PRECAUTIONS: Fall and ICD/Pacemaker  RED FLAGS: None   WEIGHT BEARING RESTRICTIONS: No  FALLS: Has patient fallen in last 6 months? No  LIVING ENVIRONMENT: Lives with: lives with their spouse; caregiver for his spouse and requires ability to transfer her Lives in: House/apartment Stairs:  4 steps to enter; 3 story home Has following equipment at home: Single point cane, Shower bench, and raised commode  PLOF: Independent; caregiver for wife; working part time as Clinical research associate- requires sitting, standing, walking   PATIENT GOALS: improve R LE strength  OBJECTIVE:    TODAY'S TREATMENT: 10/25/23 Activity Comments  Vitals at start of session 95% spO2, 93 bpm   recumbent bike L2.0 x 6 min UEs/LEs Maintaining ~47 RPM  94-96% spO2, 74 bpm   LAQ #5 2x15  sitting HS curl green TB 2x15  STS + medball 10x  Reported R "knee felt funny" with LAQ. Last 5 reps required airex under bottom   96% spO2, 113 bpm  Mod thomas stretch R 2x30" Tolerated well. C/o mild wooziness upon sitting up which dissipated in a minute or so   fwd/back stepping over  hurdle In II bars; required B UE support                Note: Objective measures were completed at Evaluation unless otherwise noted.  DIAGNOSTIC FINDINGS: none recent  COGNITION: Overall cognitive status: Within functional limits for tasks assessed   SENSATION: Intact in B LEs to light touch   COORDINATION: Alternating pronation/supination: slightly slowed Alternating toe tap: B WNL Finger to nose: B WNL   POSTURE: rounded shoulders and forward head   *PALPATION: no TTP in B calf; slightly more soft tissue restriction on L  LOWER EXTREMITY ROM:     Active  Right Eval Left Eval  Hip flexion    Hip extension    Hip abduction    Hip adduction    Hip internal rotation    Hip external rotation    Knee flexion    Knee extension    Ankle dorsiflexion 14 *c/o pain in calf  10  Ankle plantarflexion    Ankle inversion    Ankle eversion     (Blank rows = not tested)  LOWER EXTREMITY MMT:    MMT Right Eval Left Eval  Hip flexion 4 4+  Hip extension    Hip abduction 4 4  Hip adduction 4 4  Hip internal rotation    Hip external rotation    Knee flexion 4 4+  Knee extension 4 4+  Ankle dorsiflexion 4+ 4+  Ankle plantarflexion 4+ 4+  Ankle inversion    Ankle eversion    (Blank rows = not tested)   GAIT: Gait pattern: Significant R-ward trunk lean with gait; slightly slowed gait speed  Assistive device utilized: None Level of assistance: Modified independence   FUNCTIONAL TESTS:    TODAY'S TREATMENT:                                                                                                                              DATE:   PATIENT EDUCATION: Education details: prognosis, POC, HEP, answered pt's questions on leg press machine, edu on aquatic therapy as pt requests this Person educated: Patient Education method: Explanation, Demonstration, Tactile cues, Verbal cues, and Handouts Education comprehension: verbalized understanding and returned  demonstration  HOME EXERCISE PROGRAM: Access Code: [email or web address] URL: [email or web address] Date: 08/13/2023 Prepared by: [facility] Outpatient  Rehab - [facility]  Exercises - Standing Gastroc Stretch at Counter  - 1 x daily - 5 x weekly - 2 sets - 30 sec hold - Seated Hamstring Stretch  - 1 x daily - 5 x weekly - 2 sets - 30 sec hold - Standing Hip  Abduction with Counter Support  - 1 x daily - 5 x weekly - 2 sets - 10 reps - Standing Hip Extension with Counter Support  - 1 x daily - 5 x weekly - 2 sets - 10 reps - Mini Squat with Counter Support  - 1 x daily - 5 x weekly - 2 sets - 10 reps  GOALS: Goals reviewed with patient? Yes   SHORT TERM GOALS: Target date: 09/03/2023>UPDATED 10/26/2023  Patient to be independent with initial HEP. Baseline: HEP initiated Goal status: MET12/07/2023  Pt will perform Condition 4 on MCTSIB for 30 seconds moderate or less sway Baseline:  Severe sway 20 sec Goal status:  INITIAL    LONG TERM GOALS: Target date: +09/24/2023>UPDATED TARGET 11/09/2023  Patient to be independent with advanced HEP. Baseline: Not yet initiated  Goal status: IN PROGRESS  Patient to demonstrate B LE strength >/=4+/5.  Baseline: See above Goal status: IN PROGRESS 09/28/2023  Patient to demonstrate L ankle dorsiflexion AROM to at least 14 deg in order to improve efficiency of gait.   Baseline: 10 deg>15 degrees Goal status: MET 09/28/2023  Patient to demonstrate safe stair navigation with alternating reciprocal pattern and no UE support.  Baseline: alternating reciprocal pattern with 1-2 UE support required ; continues to need UE support and alt pattern Goal status: IN PROGRESS 09/28/2023  Patient to score at least 20/24 on DGI in order to decrease risk of falls.  Baseline: 17>21/24 Goal status: MET1/23/2025  Patient to demonstrate 25% improvement in R-ward trunk lean with gait.  Baseline: significant R lean>slight improvement per report  09/28/2023 Goal status: IN PROGRESS 09/28/2023  Pt will improve FGA score to 22/30 for decreased fall risk. Baseline:  19/30 Goal status:  IN PROGRESS  Pt will report ability to push wife's wheelchair up and down ramp into home. Baseline:  Currently not able to do; nurse has to help Goal Status:  IN PROGRESS   ASSESSMENT:  CLINICAL IMPRESSION: Patient arrived to session without new complaints. Continued to monitor spO2 throughout session which was normal throughout. Patient performed LE strengthening activities with good form. Reported fatigue with weighted STS, requiring modification with slightly elevated seat height which improved success. Addressed c/o R thigh tightness with stretching that was well-tolerated. Balance activities today required B UE support d/t instability. Patient reported some LE tightness at end of session, otherwise without complaints.    OBJECTIVE IMPAIRMENTS: Abnormal gait, decreased balance, difficulty walking, decreased ROM, decreased strength, impaired flexibility, impaired sensation, postural dysfunction, and pain.   ACTIVITY LIMITATIONS: carrying, lifting, bending, standing, squatting, stairs, transfers, bathing, toileting, dressing, locomotion level, and caring for others  PARTICIPATION LIMITATIONS: meal prep, cleaning, laundry, shopping, community activity, occupation, yard work, and church  PERSONAL FACTORS: Age, Past/current experiences, Time since onset of injury/illness/exacerbation, and 3+ comorbidities: HFrEF, atrial fibrillation, VF cardiac arrest s/p ICD, CAD, severe aortic regurgitation s/p AVR 10/2018 and aortic root replacement with CABGx1, aortic dilation, hypertension, hypothyroidism, type 2 diabetes, multiple myeloma, CKD IIIb-IV, migraine  are also affecting patient's functional outcome.   REHAB POTENTIAL: Good  CLINICAL DECISION MAKING: Evolving/moderate complexity  EVALUATION COMPLEXITY: Moderate  PLAN:  PT FREQUENCY: 2x/week  PT  DURATION: 6 weeks  PLANNED INTERVENTIONS: 97164- PT Re-evaluation, 97110-Therapeutic exercises, 97530- Therapeutic activity, 97112- Neuromuscular re-education, 97535- Self Care, 16109- Manual therapy, [phone]- Gait training, [phone]- Canalith repositioning, [phone]- Aquatic Therapy, Patient/Family education, Balance training, Stair training, Taping, Dry Needling, Joint mobilization, Spinal mobilization, Vestibular training, DME instructions, Cryotherapy, and Moist heat   PLAN  FOR NEXT SESSION:  Warm up with recumbent bike for RLE flexibility; progress Bilat hip and R knee strength, stairs-step ups, step downs, step taps (consider adding to HEP), balance challenges on level/unlevel surfaces, treadmill for incline/decline   AQUATICS: Frequency: 1x/wk Duration: 6 wks Special Instruction: R LE strengthening, balance      Baldemar Friday, PT, DPT 10/25/23 8:45 AM  [facility] [address] [place], [place] [address] Phone # [phone] Fax # [phone]

## 2023-10-25 ENCOUNTER — Encounter: Payer: Self-pay | Admitting: Physical Therapy

## 2023-10-25 ENCOUNTER — Ambulatory Visit: Payer: Medicare Other | Admitting: Physical Therapy

## 2023-10-25 DIAGNOSIS — R2681 Unsteadiness on feet: Secondary | ICD-10-CM | POA: Diagnosis not present

## 2023-10-25 DIAGNOSIS — R2689 Other abnormalities of gait and mobility: Secondary | ICD-10-CM

## 2023-10-25 DIAGNOSIS — M6281 Muscle weakness (generalized): Secondary | ICD-10-CM | POA: Diagnosis not present

## 2023-10-25 LAB — PROTEIN ELECTROPHORESIS, SERUM, WITH REFLEX
A/G Ratio: 1.3 (ref 0.7–1.7)
Albumin ELP: 3 g/dL (ref 2.9–4.4)
Alpha-1-Globulin: 0.3 g/dL (ref 0.0–0.4)
Alpha-2-Globulin: 0.6 g/dL (ref 0.4–1.0)
Beta Globulin: 1 g/dL (ref 0.7–1.3)
Gamma Globulin: 0.5 g/dL (ref 0.4–1.8)
Globulin, Total: 2.4 g/dL (ref 2.2–3.9)
Total Protein ELP: 5.4 g/dL — ABNORMAL LOW (ref 6.0–8.5)

## 2023-10-25 NOTE — Therapy (Signed)
OUTPATIENT PHYSICAL THERAPY/AQUATIC THERAPY TREATMENT NOTE   Patient Name: Eric Camplin., MD MRN: [id number] DOB:[birth year], 73 y.o., male Today's Date: 10/29/2023   PCP:    Dorothyann Peng, MD   REFERRING PROVIDER: Chilton Si, MD   END OF SESSION:  PT End of Session - 10/29/23 1554     Visit Number 16    Number of Visits 22    Date for PT Re-Evaluation 11/09/23    Authorization Type Medicare/[employer]    PT Start Time 1534    PT Stop Time 1612    PT Time Calculation (min) 38 min    Equipment Utilized During Treatment Gait belt    Activity Tolerance Patient tolerated treatment well    Behavior During Therapy WFL for tasks assessed/performed                Past Medical History:  Diagnosis Date   Acute blood loss anemia    Acute encephalopathy    Acute hypoxemic respiratory failure (HCC)    Acute idiopathic gout of right ankle    Acute lumbar back pain    Acute on chronic systolic heart failure (HCC)    Acute respiratory failure (HCC)    AKI (acute kidney injury) (HCC)    Altered mental status    Aortic aneurysm without rupture (HCC) 02/09/2016   Aortic root enlargement (HCC) 02/13/2012   Aortic valve regurgitation, acquired 02/13/2012   Arrhythmia    Atrial flutter (HCC) 03/18/2012   Back pain    Cardiac arrest (HCC) 02/01/2016   CHF (congestive heart failure) (HCC)    Chronic anticoagulation 03/04/2013   Chronic combined systolic and diastolic heart failure (HCC)    Chronic kidney disease    kidney fx studies increased    Chronic lower back pain    Chronic renal insufficiency    Chronic systolic heart failure (HCC) 02/13/2012   Recent diagnosis 4 / 2013, LVEF 25% by Echo 12/2011  03/2013: Echo at [facility] Conclusions: 1. Left ventricular ejection fraction estimated by 2D at 40-45 percent. 2. Mild concentric left ventricular hypertrophy. 3. Mild left atrial enlargement. 4. Moderate aortic valve regurgitation. 5. The aortic root at the sinus(es)  of valsalva is moderately dilated 6. Mild mitral valve regurgitation. 7.   CKD (chronic kidney disease)    CKD (chronic kidney disease), stage IV (HCC) 08/06/2013   Creatinine 2.4 on 07/04/13    Claustrophobia    Colon cancer screening 03/04/2013   Debility 02/22/2016   Diabetes mellitus type 2 in nonobese [facility])    Diabetes mellitus type 2 in obese    Dysrhythmia    "palpitations"   Encounter for central line placement    Exertional dyspnea 01/2012   Femoral nerve injury 02/22/2016   Femoral neuropathy    Goals of care, counseling/discussion 05/05/2020   HCAP (healthcare-associated pneumonia)    Heart murmur    Hyperlipidemia 02/13/2012   Hypertension    Hypothyroidism    Internal hemorrhoids without mention of complication 04/11/2013   Labile blood pressure    Left bundle branch block 02/13/2012   Leg weakness, bilateral    Long term (current) use of anticoagulants 08/06/2013   Eliquis therapy    Long term current use of amiodarone 08/07/2016   Lower extremity weakness    Migraine 02/13/2012   "opthalmic"   Multiple myeloma (HCC) 05/05/2020   Non-traumatic rhabdomyolysis    Obesity (BMI 30-39.9) 02/13/2012   Pain    Paroxysmal atrial fibrillation (HCC)    Pneumonia  Retroperitoneal bleed    Right ankle pain    Right knee pain    Severe aortic regurgitation 02/09/2016   Special screening for malignant neoplasms, colon 04/11/2013   Thyroid activity decreased    Tibial pain    Varicose vein of leg    right   Ventricular fibrillation (HCC) 02/09/2016   Weakness of both lower extremities    Past Surgical History:  Procedure Laterality Date   AORTIC VALVE REPLACEMENT  11/20/2018   CARDIAC CATHETERIZATION N/A 02/17/2016   Procedure: Left Heart Cath and Coronary Angiography;  Surgeon: Corky Crafts, MD;  Location: [facility] INVASIVE CV LAB;  Service: Cardiovascular;  Laterality: N/A;   CARDIOVERSION  03/22/2012   Procedure: CARDIOVERSION;  Surgeon: Donato Schultz, MD;   Location: [facility] ENDOSCOPY;  Service: Cardiovascular;  Laterality: N/A;   CARDIOVERSION  04/19/2012   Procedure: CARDIOVERSION;  Surgeon: Lesleigh Noe, MD;  Location: [facility] OR;  Service: Cardiovascular;  Laterality: N/A;   COLONOSCOPY N/A 04/11/2013   Procedure: COLONOSCOPY;  Surgeon: Louis Meckel, MD;  Location: [facility] ENDOSCOPY;  Service: Endoscopy;  Laterality: N/A;   COLONOSCOPY N/A 04/11/2013   Procedure: COLONOSCOPY;  Surgeon: Louis Meckel, MD;  Location: [facility] ENDOSCOPY;  Service: Endoscopy;  Laterality: N/A;   EP IMPLANTABLE DEVICE N/A 02/17/2016   Procedure: BiV ICD Insertion CRT-D;  Surgeon: Marinus Maw, MD;  Location: [facility] INVASIVE CV LAB;  Service: Cardiovascular;  Laterality: N/A;   FINGER SURGERY  2012   "4th digit right hand; thumb on left hand"   RADIOLOGY WITH ANESTHESIA N/A 02/11/2016   Procedure: MRI OF THE BRAIN WITHOUT CONTRAST, LUMBAR WITHOUT CONTRAST;  Surgeon: Medication Radiologist, MD;  Location: [facility] OR;  Service: Radiology;  Laterality: N/A;  DR. WOOD/MRI   RIGHT/LEFT HEART CATH AND CORONARY ANGIOGRAPHY N/A 07/26/2018   Procedure: RIGHT/LEFT HEART CATH AND CORONARY ANGIOGRAPHY;  Surgeon: Lyn Records, MD;  Location: [facility] INVASIVE CV LAB;  Service: Cardiovascular;  Laterality: N/A;   Skin melanocytoma excision  2012   "above left clavicle"   STERNAL INCISION RECLOSURE  11/2018   STERNAL WIRE REMOVAL  11/2018   STERNAL WOUND DEBRIDEMENT  11/2018   TEE WITHOUT CARDIOVERSION  03/22/2012   Procedure: TRANSESOPHAGEAL ECHOCARDIOGRAM (TEE);  Surgeon: Donato Schultz, MD;  Location: [facility] ENDOSCOPY;  Service: Cardiovascular;  Laterality: N/A;   TEE WITHOUT CARDIOVERSION N/A 02/08/2016   Procedure: TRANSESOPHAGEAL ECHOCARDIOGRAM (TEE);  Surgeon: Lewayne Bunting, MD;  Location: [facility] ENDOSCOPY;  Service: Cardiovascular;  Laterality: N/A;   Patient Active Problem List   Diagnosis Date Noted   Enlarged prostate 09/10/2023   Dyslipidemia due to type 2 diabetes mellitus (HCC) 09/10/2023    Acquired thrombophilia (HCC) 05/14/2023   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   Goals of care, counseling/discussion 05/05/2020   Multiple myeloma (HCC) 05/05/2020   S/P flap graft 01/07/2019   Coronary artery disease 01/07/2019   Long term (current) use of antibiotics 01/03/2019   Essential hypertension 12/27/2018   Postoperative infection of wound of sternum 12/27/2018   Decreased strength, endurance, and mobility 12/27/2018   Cardiac LV ejection fraction 30-35% 12/27/2018   Decreased activities of daily living (ADL) 12/27/2018   Klebsiella infection 12/27/2018   S/P AVR (aortic valve replacement) 11/23/2018   S/P ascending aortic aneurysm repair 11/23/2018   NICM (nonischemic cardiomyopathy) (HCC) 11/19/2018   AICD (automatic cardioverter/defibrillator) present 11/19/2018   Other symptoms and signs involving the musculoskeletal system 05/08/2018   Long term current use of amiodarone 08/07/2016   Type  2 diabetes mellitus with stage 3b chronic kidney disease, without long-term current use of insulin (HCC)    Femoral neuropathy    Leg weakness, bilateral    Paroxysmal atrial fibrillation (HCC)    Ventricular fibrillation (HCC) 02/09/2016   Cardiac arrest (HCC) 02/01/2016   Thyroid activity decreased    Arrhythmia    CKD (chronic kidney disease), stage IV (HCC) 08/06/2013    Class: Chronic   Long term current use of anticoagulant 03/04/2013   Atrial flutter (HCC) 03/18/2012    Class: Acute   Chronic systolic heart failure (HCC) 02/13/2012    Class: Acute   Hypertension, accelerated 02/13/2012   Hyperlipidemia 02/13/2012   Left bundle branch block 02/13/2012   Obesity (BMI 30-39.9) 02/13/2012    ONSET DATE: "quite some time" reports years   REFERRING DIAG:  G62.9 (ICD-10-CM) - Neuropathy Reprot THERAPY DIAG:  Unsteadiness on feet  Muscle weakness (generalized)  Other abnormalities of gait and mobility  Rationale for Evaluation and Treatment:  Rehabilitation  SUBJECTIVE:                                                                                                                                                                                             SUBJECTIVE STATEMENT: Wife is still in the hospital and has gotten a bit worse.    Pt accompanied by: self  PERTINENT HISTORY: HFrEF, atrial fibrillation, VF cardiac arrest s/p ICD, CAD, severe aortic regurgitation s/p AVR 10/2018 and aortic root replacement with CABGx1, aortic dilation, hypertension, hypothyroidism, type 2 diabetes, multiple myeloma, CKD IIIb-IV, migraine  PAIN:  Are you having pain? Yes: NPRS scale: "no"/10 Pain location: R knee Pain description: tight, stiff  Aggravating factors: reports this is chronic; worse in AM Relieving factors: exercises in bed   PRECAUTIONS: Fall and ICD/Pacemaker  RED FLAGS: None   WEIGHT BEARING RESTRICTIONS: No  FALLS: Has patient fallen in last 6 months? No  LIVING ENVIRONMENT: Lives with: lives with their spouse; caregiver for his spouse and requires ability to transfer her Lives in: House/apartment Stairs:  4 steps to enter; 3 story home Has following equipment at home: Single point cane, Shower bench, and raised commode  PLOF: Independent; caregiver for wife; working part time as Clinical research associate- requires sitting, standing, walking   PATIENT GOALS: improve R LE strength  OBJECTIVE:     TODAY'S TREATMENT: 10/29/23 Activity Comments  recumbent bike UEs/LEs L3.0 x 5.5 min  For LE strengthening   6"step ups holding ball 2x10 CGA for safety   Alt toe tap on 6" 10x  Good stability   fwd/back stepping over hurdle 10x each  Light  B UE support   sidestepping over hurdles 3x30" Cues to reduce UE support      HOME EXERCISE PROGRAM Last updated: 10/29/23 Access Code: [email or web address] URL: [email or web address] Date: 10/29/2023 Prepared by: [facility] - Outpatient  Rehab - [facility]  Exercises -  Standing Gastroc Stretch at Counter  - 1 x daily - 5 x weekly - 2 sets - 30 sec hold - Seated Hamstring Stretch  - 1 x daily - 5 x weekly - 2 sets - 30 sec hold - Standing Hip Abduction with Counter Support  - 1 x daily - 5 x weekly - 2 sets - 10 reps - Standing Hip Extension with Counter Support  - 1 x daily - 5 x weekly - 2 sets - 10 reps - Mini Squat with Counter Support  - 1 x daily - 5 x weekly - 2 sets - 10 reps - Side Stepping with Resistance at Ankles and Counter Support  - 1 x daily - 7 x weekly - 1 sets - 3-5 reps - Hip Flexor/Ankle strengthening  - 1 x daily - 7 x weekly - 1 sets - 3 reps - 30 sec hold - Tall Kneeling Hip Hinge  - 1 x daily - 7 x weekly - Seated Long Arc Quad with Ankle Weight  - 1 x daily - 5 x weekly - 2 sets - 10 reps - Sit to Stand  - 1 x daily - 5 x weekly - 2 sets - 10 reps   PATIENT EDUCATION: Education details: discussed pt's progress and remaining impairments- pt reports ascending stairs is still challenging, HEP update and edu on 1-5lbs adjustable ankle weights for exercise progression  Person educated: Patient Education method: Explanation, Demonstration, Tactile cues, Verbal cues, and Handouts Education comprehension: verbalized understanding and returned demonstration    Note: Objective measures were completed at Evaluation unless otherwise noted.  DIAGNOSTIC FINDINGS: none recent  COGNITION: Overall cognitive status: Within functional limits for tasks assessed   SENSATION: Intact in B LEs to light touch   COORDINATION: Alternating pronation/supination: slightly slowed Alternating toe tap: B WNL Finger to nose: B WNL   POSTURE: rounded shoulders and forward head   *PALPATION: no TTP in B calf; slightly more soft tissue restriction on L  LOWER EXTREMITY ROM:     Active  Right Eval Left Eval  Hip flexion    Hip extension    Hip abduction    Hip adduction    Hip internal rotation    Hip external rotation    Knee flexion    Knee  extension    Ankle dorsiflexion 14 *c/o pain in calf  10  Ankle plantarflexion    Ankle inversion    Ankle eversion     (Blank rows = not tested)  LOWER EXTREMITY MMT:    MMT Right Eval Left Eval  Hip flexion 4 4+  Hip extension    Hip abduction 4 4  Hip adduction 4 4  Hip internal rotation    Hip external rotation    Knee flexion 4 4+  Knee extension 4 4+  Ankle dorsiflexion 4+ 4+  Ankle plantarflexion 4+ 4+  Ankle inversion    Ankle eversion    (Blank rows = not tested)   GAIT: Gait pattern: Significant R-ward trunk lean with gait; slightly slowed gait speed  Assistive device utilized: None Level of assistance: Modified independence   FUNCTIONAL TESTS:    TODAY'S TREATMENT:  DATE:   PATIENT EDUCATION: Education details: prognosis, POC, HEP, answered pt's questions on leg press machine, edu on aquatic therapy as pt requests this Person educated: Patient Education method: Explanation, Demonstration, Tactile cues, Verbal cues, and Handouts Education comprehension: verbalized understanding and returned demonstration  HOME EXERCISE PROGRAM: Access Code: [email or web address] URL: [email or web address] Date: 08/13/2023 Prepared by: [facility] - Outpatient  Rehab - [facility]  Exercises - Standing Gastroc Stretch at Counter  - 1 x daily - 5 x weekly - 2 sets - 30 sec hold - Seated Hamstring Stretch  - 1 x daily - 5 x weekly - 2 sets - 30 sec hold - Standing Hip Abduction with Counter Support  - 1 x daily - 5 x weekly - 2 sets - 10 reps - Standing Hip Extension with Counter Support  - 1 x daily - 5 x weekly - 2 sets - 10 reps - Mini Squat with Counter Support  - 1 x daily - 5 x weekly - 2 sets - 10 reps  GOALS: Goals reviewed with patient? Yes   SHORT TERM GOALS: Target date: 09/03/2023>UPDATED 10/26/2023  Patient to be independent  with initial HEP. Baseline: HEP initiated Goal status: MET12/07/2023  Pt will perform Condition 4 on MCTSIB for 30 seconds moderate or less sway Baseline:  Severe sway 20 sec Goal status:  INITIAL    LONG TERM GOALS: Target date: +09/24/2023>UPDATED TARGET 11/09/2023  Patient to be independent with advanced HEP. Baseline: Not yet initiated  Goal status: IN PROGRESS  Patient to demonstrate B LE strength >/=4+/5.  Baseline: See above Goal status: IN PROGRESS 09/28/2023  Patient to demonstrate L ankle dorsiflexion AROM to at least 14 deg in order to improve efficiency of gait.   Baseline: 10 deg>15 degrees Goal status: MET 09/28/2023  Patient to demonstrate safe stair navigation with alternating reciprocal pattern and no UE support.  Baseline: alternating reciprocal pattern with 1-2 UE support required ; continues to need UE support and alt pattern Goal status: IN PROGRESS 09/28/2023  Patient to score at least 20/24 on DGI in order to decrease risk of falls.  Baseline: 17>21/24 Goal status: MET1/23/2025  Patient to demonstrate 25% improvement in R-ward trunk lean with gait.  Baseline: significant R lean>slight improvement per report 09/28/2023 Goal status: IN PROGRESS 09/28/2023  Pt will improve FGA score to 22/30 for decreased fall risk. Baseline:  19/30 Goal status:  IN PROGRESS  Pt will report ability to push wife's wheelchair up and down ramp into home. Baseline:  Currently not able to do; nurse has to help Goal Status:  IN PROGRESS   ASSESSMENT:  CLINICAL IMPRESSION: Patient arrived to session without new complaints. Worked on dynamic balance and LE strengthening activities. Good ability to perform step ups without UE support and stability with SLS tasks. After discussing progress towards goals, patient reports benefit from therapy but reports remaining muscle fatigue when ascending stairs. HEP was updated with weighted LE strengthening to continue addressing quad weakness.  Patient reported understanding and without complaints at end of session.    OBJECTIVE IMPAIRMENTS: Abnormal gait, decreased balance, difficulty walking, decreased ROM, decreased strength, impaired flexibility, impaired sensation, postural dysfunction, and pain.   ACTIVITY LIMITATIONS: carrying, lifting, bending, standing, squatting, stairs, transfers, bathing, toileting, dressing, locomotion level, and caring for others  PARTICIPATION LIMITATIONS: meal prep, cleaning, laundry, shopping, community activity, occupation, yard work, and church  PERSONAL FACTORS: Age, Past/current experiences, Time since onset of injury/illness/exacerbation, and 3+ comorbidities: HFrEF, atrial fibrillation, VF cardiac arrest s/p  ICD, CAD, severe aortic regurgitation s/p AVR 10/2018 and aortic root replacement with CABGx1, aortic dilation, hypertension, hypothyroidism, type 2 diabetes, multiple myeloma, CKD IIIb-IV, migraine  are also affecting patient's functional outcome.   REHAB POTENTIAL: Good  CLINICAL DECISION MAKING: Evolving/moderate complexity  EVALUATION COMPLEXITY: Moderate  PLAN:  PT FREQUENCY: 2x/week  PT DURATION: 6 weeks  PLANNED INTERVENTIONS: 97164- PT Re-evaluation, 97110-Therapeutic exercises, 97530- Therapeutic activity, 97112- Neuromuscular re-education, 97535- Self Care, 95284- Manual therapy, [phone]- Gait training, [phone]- Canalith repositioning, [phone]- Aquatic Therapy, Patient/Family education, Balance training, Stair training, Taping, Dry Needling, Joint mobilization, Spinal mobilization, Vestibular training, DME instructions, Cryotherapy, and Moist heat   PLAN FOR NEXT SESSION:  Warm up with recumbent bike for RLE flexibility; progress Bilat hip and R knee strength, stairs-step ups, step downs, step taps (consider adding to HEP), balance challenges on level/unlevel surfaces, treadmill for incline/decline   AQUATICS: Frequency: 1x/wk Duration: 6 wks Special Instruction: R LE  strengthening, balance      Baldemar Friday, PT, DPT 10/29/23 4:15 PM  [facility] [address], [address] [place], [place] [address] Phone # [phone] Fax # [phone]

## 2023-10-26 ENCOUNTER — Ambulatory Visit (INDEPENDENT_AMBULATORY_CARE_PROVIDER_SITE_OTHER): Payer: Medicare Other | Admitting: Podiatry

## 2023-10-26 ENCOUNTER — Encounter: Payer: Self-pay | Admitting: Podiatry

## 2023-10-26 VITALS — Ht 68.0 in | Wt 196.0 lb

## 2023-10-26 DIAGNOSIS — E119 Type 2 diabetes mellitus without complications: Secondary | ICD-10-CM | POA: Diagnosis not present

## 2023-10-26 DIAGNOSIS — L608 Other nail disorders: Secondary | ICD-10-CM | POA: Diagnosis not present

## 2023-10-26 DIAGNOSIS — B351 Tinea unguium: Secondary | ICD-10-CM | POA: Diagnosis not present

## 2023-10-26 DIAGNOSIS — M79674 Pain in right toe(s): Secondary | ICD-10-CM

## 2023-10-26 DIAGNOSIS — M79675 Pain in left toe(s): Secondary | ICD-10-CM

## 2023-10-26 NOTE — Progress Notes (Signed)
This patient returns to my office for at risk foot care.  This patient requires this care by a professional since this patient will be at risk due to having diabetes type 2, chronic kidney disease and coagulation defect.  Patient is taking eliquisThis patient is unable to cut nails himself since the patient cannot reach his nails.These nails are painful walking and wearing shoes.  This patient presents for at risk foot care today. ? ?General Appearance  Alert, conversant and in no acute stress. ? ?Vascular  Dorsalis pedis and posterior tibial  pulses are palpable  bilaterally.  Capillary return is within normal limits  bilaterally. Temperature is within normal limits  bilaterally. ? ?Neurologic  Senn-Weinstein monofilament wire test within normal limits  bilaterally. Muscle power within normal limits bilaterally. ? ?Nails Thick disfigured discolored nails with subungual debris  from hallux to fifth toes bilaterally. No evidence of bacterial infection or drainage bilaterally. ? ?Orthopedic  No limitations of motion  feet .  No crepitus or effusions noted.  No bony pathology or digital deformities noted. ? ?Skin  normotropic skin with no porokeratosis noted bilaterally.  No signs of infections or ulcers noted.    ? ?Onychomycosis  Pain in right toes  Pain in left toes ? ?Consent was obtained for treatment procedures.   Mechanical debridement of nails 1-5  bilaterally performed with a nail nipper.  Filed with dremel without incident.  ? ? ?Return office visit   10 weeks                   Told patient to return for periodic foot care and evaluation due to potential at risk complications. ? ? ?Cadan Maggart DPM  ?

## 2023-10-29 ENCOUNTER — Ambulatory Visit: Payer: Medicare Other | Attending: Cardiovascular Disease | Admitting: Physical Therapy

## 2023-10-29 ENCOUNTER — Encounter: Payer: Self-pay | Admitting: Physical Therapy

## 2023-10-29 DIAGNOSIS — R2689 Other abnormalities of gait and mobility: Secondary | ICD-10-CM | POA: Insufficient documentation

## 2023-10-29 DIAGNOSIS — R2681 Unsteadiness on feet: Secondary | ICD-10-CM | POA: Insufficient documentation

## 2023-10-29 DIAGNOSIS — M6281 Muscle weakness (generalized): Secondary | ICD-10-CM | POA: Diagnosis not present

## 2023-11-02 ENCOUNTER — Ambulatory Visit: Payer: Medicare Other

## 2023-11-02 DIAGNOSIS — M6281 Muscle weakness (generalized): Secondary | ICD-10-CM

## 2023-11-02 DIAGNOSIS — R2681 Unsteadiness on feet: Secondary | ICD-10-CM

## 2023-11-02 DIAGNOSIS — R2689 Other abnormalities of gait and mobility: Secondary | ICD-10-CM | POA: Diagnosis not present

## 2023-11-02 NOTE — Therapy (Signed)
 OUTPATIENT PHYSICAL THERAPY/AQUATIC THERAPY TREATMENT NOTE   Patient Name: Eric [name]., MD MRN: [id number] DOB:26-Dec-1950, 73 y.o., male Today's Date: 11/02/2023   PCP:    Jarold Medici, MD   REFERRING PROVIDER: Raford Riggs, MD   END OF SESSION:  PT End of Session - 11/02/23 0945     Visit Number 17    Number of Visits 22    Date for PT Re-Evaluation 11/09/23    Authorization Type Medicare/[employer]    PT Start Time 0945   late arrival   PT Stop Time 1015    PT Time Calculation (min) 30 min    Equipment Utilized During Treatment Gait belt    Activity Tolerance Patient tolerated treatment well    Behavior During Therapy WFL for tasks assessed/performed                Past Medical History:  Diagnosis Date   Acute blood loss anemia    Acute encephalopathy    Acute hypoxemic respiratory failure (HCC)    Acute idiopathic gout of right ankle    Acute lumbar back pain    Acute on chronic systolic heart failure (HCC)    Acute respiratory failure (HCC)    AKI (acute kidney injury) (HCC)    Altered mental status    Aortic aneurysm without rupture (HCC) 02/09/2016   Aortic root enlargement (HCC) 02/13/2012   Aortic valve regurgitation, acquired 02/13/2012   Arrhythmia    Atrial flutter (HCC) 03/18/2012   Back pain    Cardiac arrest (HCC) 02/01/2016   CHF (congestive heart failure) (HCC)    Chronic anticoagulation 03/04/2013   Chronic combined systolic and diastolic heart failure (HCC)    Chronic kidney disease    kidney fx studies increased    Chronic lower back pain    Chronic renal insufficiency    Chronic systolic heart failure (HCC) 02/13/2012   Recent diagnosis 4 / 2013, LVEF 25% by Echo 12/2011  03/2013: Echo at [facility] Conclusions: 1. Left ventricular ejection fraction estimated by 2D at 40-45 percent. 2. Mild concentric left ventricular hypertrophy. 3. Mild left atrial enlargement. 4. Moderate aortic valve regurgitation. 5. The aortic root at  the sinus(es) of valsalva is moderately dilated 6. Mild mitral valve regurgitation. 7.   CKD (chronic kidney disease)    CKD (chronic kidney disease), stage IV (HCC) 08/06/2013   Creatinine 2.4 on 07/04/13    Claustrophobia    Colon cancer screening 03/04/2013   Debility 02/22/2016   Diabetes mellitus type 2 in nonobese [facility])    Diabetes mellitus type 2 in obese    Dysrhythmia    palpitations   Encounter for central line placement    Exertional dyspnea 01/2012   Femoral nerve injury 02/22/2016   Femoral neuropathy    Goals of care, counseling/discussion 05/05/2020   HCAP (healthcare-associated pneumonia)    Heart murmur    Hyperlipidemia 02/13/2012   Hypertension    Hypothyroidism    Internal hemorrhoids without mention of complication 04/11/2013   Labile blood pressure    Left bundle branch block 02/13/2012   Leg weakness, bilateral    Long term (current) use of anticoagulants 08/06/2013   Eliquis  therapy    Long term current use of amiodarone  08/07/2016   Lower extremity weakness    Migraine 02/13/2012   opthalmic   Multiple myeloma (HCC) 05/05/2020   Non-traumatic rhabdomyolysis    Obesity (BMI 30-39.9) 02/13/2012   Pain    Paroxysmal atrial fibrillation (HCC)  Pneumonia    Retroperitoneal bleed    Right ankle pain    Right knee pain    Severe aortic regurgitation 02/09/2016   Special screening for malignant neoplasms, colon 04/11/2013   Thyroid  activity decreased    Tibial pain    Varicose vein of leg    right   Ventricular fibrillation (HCC) 02/09/2016   Weakness of both lower extremities    Past Surgical History:  Procedure Laterality Date   AORTIC VALVE REPLACEMENT  11/20/2018   CARDIAC CATHETERIZATION N/A 02/17/2016   Procedure: Left Heart Cath and Coronary Angiography;  Surgeon: Candyce GORMAN Reek, MD;  Location: [facility] INVASIVE CV LAB;  Service: Cardiovascular;  Laterality: N/A;   CARDIOVERSION  03/22/2012   Procedure: CARDIOVERSION;  Surgeon: Oneil Parchment, MD;  Location: [facility] ENDOSCOPY;  Service: Cardiovascular;  Laterality: N/A;   CARDIOVERSION  04/19/2012   Procedure: CARDIOVERSION;  Surgeon: Victory LELON Claudene DOUGLAS, MD;  Location: [facility] OR;  Service: Cardiovascular;  Laterality: N/A;   COLONOSCOPY N/A 04/11/2013   Procedure: COLONOSCOPY;  Surgeon: Lamar JONETTA Aho, MD;  Location: [facility] ENDOSCOPY;  Service: Endoscopy;  Laterality: N/A;   COLONOSCOPY N/A 04/11/2013   Procedure: COLONOSCOPY;  Surgeon: Lamar JONETTA Aho, MD;  Location: [facility] ENDOSCOPY;  Service: Endoscopy;  Laterality: N/A;   EP IMPLANTABLE DEVICE N/A 02/17/2016   Procedure: BiV ICD Insertion CRT-D;  Surgeon: Danelle LELON Birmingham, MD;  Location: [facility] INVASIVE CV LAB;  Service: Cardiovascular;  Laterality: N/A;   FINGER SURGERY  2012   4th digit right hand; thumb on left hand   RADIOLOGY WITH ANESTHESIA N/A 02/11/2016   Procedure: MRI OF THE BRAIN WITHOUT CONTRAST, LUMBAR WITHOUT CONTRAST;  Surgeon: Medication Radiologist, MD;  Location: [facility] OR;  Service: Radiology;  Laterality: N/A;  DR. WOOD/MRI   RIGHT/LEFT HEART CATH AND CORONARY ANGIOGRAPHY N/A 07/26/2018   Procedure: RIGHT/LEFT HEART CATH AND CORONARY ANGIOGRAPHY;  Surgeon: Claudene Victory LELON, MD;  Location: [facility] INVASIVE CV LAB;  Service: Cardiovascular;  Laterality: N/A;   Skin melanocytoma excision  2012   above left clavicle   STERNAL INCISION RECLOSURE  11/2018   STERNAL WIRE REMOVAL  11/2018   STERNAL WOUND DEBRIDEMENT  11/2018   TEE WITHOUT CARDIOVERSION  03/22/2012   Procedure: TRANSESOPHAGEAL ECHOCARDIOGRAM (TEE);  Surgeon: Oneil Parchment, MD;  Location: [facility] ENDOSCOPY;  Service: Cardiovascular;  Laterality: N/A;   TEE WITHOUT CARDIOVERSION N/A 02/08/2016   Procedure: TRANSESOPHAGEAL ECHOCARDIOGRAM (TEE);  Surgeon: Redell GORMAN Shallow, MD;  Location: [facility] ENDOSCOPY;  Service: Cardiovascular;  Laterality: N/A;   Patient Active Problem List   Diagnosis Date Noted   Enlarged prostate 09/10/2023   Dyslipidemia due to type 2 diabetes mellitus (HCC)  09/10/2023   Acquired thrombophilia (HCC) 05/14/2023   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   Goals of care, counseling/discussion 05/05/2020   Multiple myeloma (HCC) 05/05/2020   S/P flap graft 01/07/2019   Coronary artery disease 01/07/2019   Long term (current) use of antibiotics 01/03/2019   Essential hypertension 12/27/2018   Postoperative infection of wound of sternum 12/27/2018   Decreased strength, endurance, and mobility 12/27/2018   Cardiac LV ejection fraction 30-35% 12/27/2018   Decreased activities of daily living (ADL) 12/27/2018   Klebsiella infection 12/27/2018   S/P AVR (aortic valve replacement) 11/23/2018   S/P ascending aortic aneurysm repair 11/23/2018   NICM (nonischemic cardiomyopathy) (HCC) 11/19/2018   AICD (automatic cardioverter/defibrillator) present 11/19/2018   Other symptoms and signs involving the musculoskeletal system 05/08/2018   Long term current use of amiodarone   08/07/2016   Type 2 diabetes mellitus with stage 3b chronic kidney disease, without long-term current use of insulin  (HCC)    Femoral neuropathy    Leg weakness, bilateral    Paroxysmal atrial fibrillation (HCC)    Ventricular fibrillation (HCC) 02/09/2016   Cardiac arrest (HCC) 02/01/2016   Thyroid  activity decreased    Arrhythmia    CKD (chronic kidney disease), stage IV (HCC) 08/06/2013    Class: Chronic   Long term current use of anticoagulant 03/04/2013   Atrial flutter (HCC) 03/18/2012    Class: Acute   Chronic systolic heart failure (HCC) 02/13/2012    Class: Acute   Hypertension, accelerated 02/13/2012   Hyperlipidemia 02/13/2012   Left bundle branch block 02/13/2012   Obesity (BMI 30-39.9) 02/13/2012    ONSET DATE: quite some time reports years   REFERRING DIAG:  G62.9 (ICD-10-CM) - Neuropathy Reprot THERAPY DIAG:  Unsteadiness on feet  Muscle weakness (generalized)  Other abnormalities of gait and mobility  Rationale for Evaluation and Treatment:  Rehabilitation  SUBJECTIVE:                                                                                                                                                                                             SUBJECTIVE STATEMENT: Wife is still in the hospital and has gotten a bit worse.    Pt accompanied by: self  PERTINENT HISTORY: HFrEF, atrial fibrillation, VF cardiac arrest s/p ICD, CAD, severe aortic regurgitation s/p AVR 10/2018 and aortic root replacement with CABGx1, aortic dilation, hypertension, hypothyroidism, type 2 diabetes, multiple myeloma, CKD IIIb-IV, migraine  PAIN:  Are you having pain? Yes: NPRS scale: no/10 Pain location: R knee Pain description: tight, stiff  Aggravating factors: reports this is chronic; worse in AM Relieving factors: exercises in bed   PRECAUTIONS: Fall and ICD/Pacemaker  RED FLAGS: None   WEIGHT BEARING RESTRICTIONS: No  FALLS: Has patient fallen in last 6 months? No  LIVING ENVIRONMENT: Lives with: lives with their spouse; caregiver for his spouse and requires ability to transfer her Lives in: House/apartment Stairs:  4 steps to enter; 3 story home Has following equipment at home: Single point cane, Shower bench, and raised commode  PLOF: Independent; caregiver for wife; working part time as Clinical Research Associate- requires sitting, standing, walking   PATIENT GOALS: improve R LE strength  OBJECTIVE:   TODAY'S TREATMENT: 11/02/23 Activity Comments  M-CTSIB Condition 3: normal Condition 4: normal  Stair lift-off 1x10   Foot on step head turns   Alt stair taps x 60 sec   LAQ 3x10 5#  Multisensory balance   Gastroc stretch 1x60 sec  slantboard      TODAY'S TREATMENT: 10/29/23 Activity Comments  recumbent bike UEs/LEs L3.0 x 5.5 min  For LE strengthening   6step ups holding ball 2x10 CGA for safety   Alt toe tap on 6 10x  Good stability   fwd/back stepping over hurdle 10x each  Light B UE support   sidestepping over  hurdles 3x30 Cues to reduce UE support      HOME EXERCISE PROGRAM Last updated: 10/29/23 Access Code: [email or web address] URL: [email or web address] Date: 10/29/2023 Prepared by: [facility] - Outpatient  Rehab - [facility]  Exercises - Standing Gastroc Stretch at Counter  - 1 x daily - 5 x weekly - 2 sets - 30 sec hold - Seated Hamstring Stretch  - 1 x daily - 5 x weekly - 2 sets - 30 sec hold - Standing Hip Abduction with Counter Support  - 1 x daily - 5 x weekly - 2 sets - 10 reps - Standing Hip Extension with Counter Support  - 1 x daily - 5 x weekly - 2 sets - 10 reps - Mini Squat with Counter Support  - 1 x daily - 5 x weekly - 2 sets - 10 reps - Side Stepping with Resistance at Ankles and Counter Support  - 1 x daily - 7 x weekly - 1 sets - 3-5 reps - Hip Flexor/Ankle strengthening  - 1 x daily - 7 x weekly - 1 sets - 3 reps - 30 sec hold - Tall Kneeling Hip Hinge  - 1 x daily - 7 x weekly - Seated Long Arc Quad with Ankle Weight  - 1 x daily - 5 x weekly - 2 sets - 10 reps - Sit to Stand  - 1 x daily - 5 x weekly - 2 sets - 10 reps - Step Up  - 1 x daily - 7 x weekly - 3 sets - 10 reps   PATIENT EDUCATION: Education details: discussed pt's progress and remaining impairments- pt reports ascending stairs is still challenging, HEP update and edu on 1-5lbs adjustable ankle weights for exercise progression  Person educated: Patient Education method: Explanation, Demonstration, Tactile cues, Verbal cues, and Handouts Education comprehension: verbalized understanding and returned demonstration    Note: Objective measures were completed at Evaluation unless otherwise noted.  DIAGNOSTIC FINDINGS: none recent  COGNITION: Overall cognitive status: Within functional limits for tasks assessed   SENSATION: Intact in B LEs to light touch   COORDINATION: Alternating pronation/supination: slightly slowed Alternating toe tap: B WNL Finger to nose: B WNL   POSTURE: rounded  shoulders and forward head   *PALPATION: no TTP in B calf; slightly more soft tissue restriction on L  LOWER EXTREMITY ROM:     Active  Right Eval Left Eval  Hip flexion    Hip extension    Hip abduction    Hip adduction    Hip internal rotation    Hip external rotation    Knee flexion    Knee extension    Ankle dorsiflexion 14 *c/o pain in calf  10  Ankle plantarflexion    Ankle inversion    Ankle eversion     (Blank rows = not tested)  LOWER EXTREMITY MMT:    MMT Right Eval Left Eval  Hip flexion 4 4+  Hip extension    Hip abduction 4 4  Hip adduction 4 4  Hip internal rotation    Hip external rotation    Knee flexion 4 4+  Knee  extension 4 4+  Ankle dorsiflexion 4+ 4+  Ankle plantarflexion 4+ 4+  Ankle inversion    Ankle eversion    (Blank rows = not tested)   GAIT: Gait pattern: Significant R-ward trunk lean with gait; slightly slowed gait speed  Assistive device utilized: None Level of assistance: Modified independence   FUNCTIONAL TESTS:      GOALS: Goals reviewed with patient? Yes   SHORT TERM GOALS: Target date: 09/03/2023>UPDATED 10/26/2023  Patient to be independent with initial HEP. Baseline: HEP initiated Goal status: MET12/07/2023  Pt will perform Condition 4 on MCTSIB for 30 seconds moderate or less sway Baseline:  Severe sway 20 sec; normal-mild x 30 sec Goal status:  MET    LONG TERM GOALS: Target date: +09/24/2023>UPDATED TARGET 11/09/2023  Patient to be independent with advanced HEP. Baseline: Not yet initiated  Goal status: IN PROGRESS  Patient to demonstrate B LE strength >/=4+/5.  Baseline: See above Goal status: IN PROGRESS 09/28/2023  Patient to demonstrate L ankle dorsiflexion AROM to at least 14 deg in order to improve efficiency of gait.   Baseline: 10 deg>15 degrees Goal status: MET 09/28/2023  Patient to demonstrate safe stair navigation with alternating reciprocal pattern and no UE support.  Baseline:  alternating reciprocal pattern with 1-2 UE support required ; continues to need UE support and alt pattern Goal status: IN PROGRESS 09/28/2023  Patient to score at least 20/24 on DGI in order to decrease risk of falls.  Baseline: 17>21/24 Goal status: MET1/23/2025  Patient to demonstrate 25% improvement in R-ward trunk lean with gait.  Baseline: significant R lean>slight improvement per report 09/28/2023 Goal status: IN PROGRESS 09/28/2023  Pt will improve FGA score to 22/30 for decreased fall risk. Baseline:  19/30 Goal status:  IN PROGRESS  Pt will report ability to push wife's wheelchair up and down ramp into home. Baseline:  Currently not able to do; nurse has to help Goal Status:  IN PROGRESS   ASSESSMENT:  CLINICAL IMPRESSION: Improved static balance/postural awareness per normal-mild sway condition 4 M-CTSIB.  Instructed in new activities for HEP to improve single limb support with strength and balance.  Demo good tolerance and notes quicker onset of fatigue to RLE vs LLE from hx of neuropathy.  Continued with multisensory balance activities to improve proprioceptive and kinesthetic awareness to facilitate righting reactions for reduced risk for falls. Continued sessions to advance POC details to meet LTG   OBJECTIVE IMPAIRMENTS: Abnormal gait, decreased balance, difficulty walking, decreased ROM, decreased strength, impaired flexibility, impaired sensation, postural dysfunction, and pain.   ACTIVITY LIMITATIONS: carrying, lifting, bending, standing, squatting, stairs, transfers, bathing, toileting, dressing, locomotion level, and caring for others  PARTICIPATION LIMITATIONS: meal prep, cleaning, laundry, shopping, community activity, occupation, yard work, and church  PERSONAL FACTORS: Age, Past/current experiences, Time since onset of injury/illness/exacerbation, and 3+ comorbidities: HFrEF, atrial fibrillation, VF cardiac arrest s/p ICD, CAD, severe aortic regurgitation s/p AVR  10/2018 and aortic root replacement with CABGx1, aortic dilation, hypertension, hypothyroidism, type 2 diabetes, multiple myeloma, CKD IIIb-IV, migraine  are also affecting patient's functional outcome.   REHAB POTENTIAL: Good  CLINICAL DECISION MAKING: Evolving/moderate complexity  EVALUATION COMPLEXITY: Moderate  PLAN:  PT FREQUENCY: 2x/week  PT DURATION: 6 weeks  PLANNED INTERVENTIONS: [id number]- PT Re-evaluation, 97110-Therapeutic exercises, 97530- Therapeutic activity, 97112- Neuromuscular re-education, 97535- Self Care, [id number]- Manual therapy, [phone]- Gait training, [phone]- Canalith repositioning, [phone]- Aquatic Therapy, Patient/Family education, Balance training, Stair training, Taping, Dry Needling, Joint mobilization, Spinal mobilization, Vestibular training, DME instructions, Cryotherapy,  and Moist heat   PLAN FOR NEXT SESSION:  Warm up with recumbent bike for RLE flexibility; progress Bilat hip and R knee strength, stairs-step ups, step downs, step taps (consider adding to HEP), balance challenges on level/unlevel surfaces, treadmill for incline/decline   AQUATICS: Frequency: 1x/wk Duration: 6 wks Special Instruction: R LE strengthening, balance      10:19 AM, 11/02/23 M. Kelly Shalay Carder, PT, DPT Physical Therapist- [place] Office Number: [phone]

## 2023-11-02 NOTE — Therapy (Signed)
 OUTPATIENT PHYSICAL THERAPY/AQUATIC THERAPY TREATMENT NOTE   Patient Name: Eric [name]., MD MRN: [id number] DOB:[birth year], 73 y.o., male Today's Date: 11/05/2023   PCP:    Cleave Curling, MD   REFERRING PROVIDER: Maudine Sos, MD   END OF SESSION:  PT End of Session - 11/05/23 1556     Visit Number 18    Number of Visits 22    Date for PT Re-Evaluation 11/09/23    Authorization Type Medicare/[employer]    PT Start Time 1540   pt late   PT Stop Time 1614    PT Time Calculation (min) 34 min    Equipment Utilized During Treatment Gait belt    Activity Tolerance Patient tolerated treatment well    Behavior During Therapy WFL for tasks assessed/performed                 Past Medical History:  Diagnosis Date   Acute blood loss anemia    Acute encephalopathy    Acute hypoxemic respiratory failure (HCC)    Acute idiopathic gout of right ankle    Acute lumbar back pain    Acute on chronic systolic heart failure (HCC)    Acute respiratory failure (HCC)    AKI (acute kidney injury) (HCC)    Altered mental status    Aortic aneurysm without rupture (HCC) 02/09/2016   Aortic root enlargement (HCC) 02/13/2012   Aortic valve regurgitation, acquired 02/13/2012   Arrhythmia    Atrial flutter (HCC) 03/18/2012   Back pain    Cardiac arrest (HCC) 02/01/2016   CHF (congestive heart failure) (HCC)    Chronic anticoagulation 03/04/2013   Chronic combined systolic and diastolic heart failure (HCC)    Chronic kidney disease    kidney fx studies increased    Chronic lower back pain    Chronic renal insufficiency    Chronic systolic heart failure (HCC) 02/13/2012   Recent diagnosis 4 / 2013, LVEF 25% by Echo 12/2011  03/2013: Echo at [facility] Card[facility] Conclusions: 1. Left ventricular ejection fraction estimated by 2D at 40-45 percent. 2. Mild concentric left ventricular hypertrophy. 3. Mild left atrial enlargement. 4. Moderate aortic valve regurgitation. 5. The aortic root at  the sinus(es) of valsalva is moderately dilated 6. Mild mitral valve regurgitation. 7.   CKD (chronic kidney disease)    CKD (chronic kidney disease), stage IV (HCC) 08/06/2013   Creatinine 2.4 on 07/04/13    Claustrophobia    Colon cancer screening 03/04/2013   Debility 02/22/2016   Diabetes mellitus type 2 in nonobese [facility])    Diabetes mellitus type 2 in obese    Dysrhythmia    "palpitations"   Encounter for central line placement    Exertional dyspnea 01/2012   Femoral nerve injury 02/22/2016   Femoral neuropathy    Goals of care, counseling/discussion 05/05/2020   HCAP (healthcare-associated pneumonia)    Heart murmur    Hyperlipidemia 02/13/2012   Hypertension    Hypothyroidism    Internal hemorrhoids without mention of complication 04/11/2013   Labile blood pressure    Left bundle branch block 02/13/2012   Leg weakness, bilateral    Long term (current) use of anticoagulants 08/06/2013   Eliquis  therapy    Long term current use of amiodarone  08/07/2016   Lower extremity weakness    Migraine 02/13/2012   "opthalmic"   Multiple myeloma (HCC) 05/05/2020   Non-traumatic rhabdomyolysis    Obesity (BMI 30-39.9) 02/13/2012   Pain    Paroxysmal atrial fibrillation (HCC)  Pneumonia    Retroperitoneal bleed    Right ankle pain    Right knee pain    Severe aortic regurgitation 02/09/2016   Special screening for malignant neoplasms, colon 04/11/2013   Thyroid  activity decreased    Tibial pain    Varicose vein of leg    right   Ventricular fibrillation (HCC) 02/09/2016   Weakness of both lower extremities    Past Surgical History:  Procedure Laterality Date   AORTIC VALVE REPLACEMENT  11/20/2018   CARDIAC CATHETERIZATION N/A 02/17/2016   Procedure: Left Heart Cath and Coronary Angiography;  Surgeon: Lucendia Rusk, MD;  Location: [facility] INVASIVE CV LAB;  Service: Cardiovascular;  Laterality: N/A;   CARDIOVERSION  03/22/2012   Procedure: CARDIOVERSION;  Surgeon: Dorothye Gathers, MD;  Location: [facility] ENDOSCOPY;  Service: Cardiovascular;  Laterality: N/A;   CARDIOVERSION  04/19/2012   Procedure: CARDIOVERSION;  Surgeon: Mickiel Albany, MD;  Location: [facility] OR;  Service: Cardiovascular;  Laterality: N/A;   COLONOSCOPY N/A 04/11/2013   Procedure: COLONOSCOPY;  Surgeon: Claudette Cue, MD;  Location: [facility] ENDOSCOPY;  Service: Endoscopy;  Laterality: N/A;   COLONOSCOPY N/A 04/11/2013   Procedure: COLONOSCOPY;  Surgeon: Claudette Cue, MD;  Location: [facility] ENDOSCOPY;  Service: Endoscopy;  Laterality: N/A;   EP IMPLANTABLE DEVICE N/A 02/17/2016   Procedure: BiV ICD Insertion CRT-D;  Surgeon: Tammie Fall, MD;  Location: [facility] INVASIVE CV LAB;  Service: Cardiovascular;  Laterality: N/A;   FINGER SURGERY  2012   "4th digit right hand; thumb on left hand"   RADIOLOGY WITH ANESTHESIA N/A 02/11/2016   Procedure: MRI OF THE BRAIN WITHOUT CONTRAST, LUMBAR WITHOUT CONTRAST;  Surgeon: Medication Radiologist, MD;  Location: [facility] OR;  Service: Radiology;  Laterality: N/A;  DR. WOOD/MRI   RIGHT/LEFT HEART CATH AND CORONARY ANGIOGRAPHY N/A 07/26/2018   Procedure: RIGHT/LEFT HEART CATH AND CORONARY ANGIOGRAPHY;  Surgeon: Arty Binning, MD;  Location: [facility] INVASIVE CV LAB;  Service: Cardiovascular;  Laterality: N/A;   Skin melanocytoma excision  2012   "above left clavicle"   STERNAL INCISION RECLOSURE  11/2018   STERNAL WIRE REMOVAL  11/2018   STERNAL WOUND DEBRIDEMENT  11/2018   TEE WITHOUT CARDIOVERSION  03/22/2012   Procedure: TRANSESOPHAGEAL ECHOCARDIOGRAM (TEE);  Surgeon: Dorothye Gathers, MD;  Location: [facility] ENDOSCOPY;  Service: Cardiovascular;  Laterality: N/A;   TEE WITHOUT CARDIOVERSION N/A 02/08/2016   Procedure: TRANSESOPHAGEAL ECHOCARDIOGRAM (TEE);  Surgeon: Lenise Quince, MD;  Location: [facility] ENDOSCOPY;  Service: Cardiovascular;  Laterality: N/A;   Patient Active Problem List   Diagnosis Date Noted   Enlarged prostate 09/10/2023   Dyslipidemia due to type 2 diabetes mellitus (HCC)  09/10/2023   Acquired thrombophilia (HCC) 05/14/2023   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   Goals of care, counseling/discussion 05/05/2020   Multiple myeloma (HCC) 05/05/2020   S/P flap graft 01/07/2019   Coronary artery disease 01/07/2019   Long term (current) use of antibiotics 01/03/2019   Essential hypertension 12/27/2018   Postoperative infection of wound of sternum 12/27/2018   Decreased strength, endurance, and mobility 12/27/2018   Cardiac LV ejection fraction 30-35% 12/27/2018   Decreased activities of daily living (ADL) 12/27/2018   Klebsiella infection 12/27/2018   S/P AVR (aortic valve replacement) 11/23/2018   S/P ascending aortic aneurysm repair 11/23/2018   NICM (nonischemic cardiomyopathy) (HCC) 11/19/2018   AICD (automatic cardioverter/defibrillator) present 11/19/2018   Other symptoms and signs involving the musculoskeletal system 05/08/2018   Long term current use of amiodarone   08/07/2016   Type 2 diabetes mellitus with stage 3b chronic kidney disease, without long-term current use of insulin  (HCC)    Femoral neuropathy    Leg weakness, bilateral    Paroxysmal atrial fibrillation (HCC)    Ventricular fibrillation (HCC) 02/09/2016   Cardiac arrest (HCC) 02/01/2016   Thyroid  activity decreased    Arrhythmia    CKD (chronic kidney disease), stage IV (HCC) 08/06/2013    Class: Chronic   Long term current use of anticoagulant 03/04/2013   Atrial flutter (HCC) 03/18/2012    Class: Acute   Chronic systolic heart failure (HCC) 02/13/2012    Class: Acute   Hypertension, accelerated 02/13/2012   Hyperlipidemia 02/13/2012   Left bundle branch block 02/13/2012   Obesity (BMI 30-39.9) 02/13/2012    ONSET DATE: "quite some time" reports years   REFERRING DIAG:  G62.9 (ICD-10-CM) - Neuropathy Reprot THERAPY DIAG:  Unsteadiness on feet  Muscle weakness (generalized)  Other abnormalities of gait and mobility  Rationale for Evaluation and Treatment:  Rehabilitation  SUBJECTIVE:                                                                                                                                                                                             SUBJECTIVE STATEMENT: Feeling tired today. Thighs are still sore from last session.    Pt accompanied by: self  PERTINENT HISTORY: HFrEF, atrial fibrillation, VF cardiac arrest s/p ICD, CAD, severe aortic regurgitation s/p AVR 10/2018 and aortic root replacement with CABGx1, aortic dilation, hypertension, hypothyroidism, type 2 diabetes, multiple myeloma, CKD IIIb-IV, migraine  PAIN:  Are you having pain? Yes: NPRS scale: "no"/10 Pain location: R knee Pain description: tight, stiff  Aggravating factors: reports this is chronic; worse in AM Relieving factors: exercises in bed   PRECAUTIONS: Fall and ICD/Pacemaker  RED FLAGS: None   WEIGHT BEARING RESTRICTIONS: No  FALLS: Has patient fallen in last 6 months? No  LIVING ENVIRONMENT: Lives with: lives with their spouse; caregiver for his spouse and requires ability to transfer her Lives in: House/apartment Stairs:  4 steps to enter; 3 story home Has following equipment at home: Single point cane, Shower bench, and raised commode  PLOF: Independent; caregiver for wife; working part time as Clinical research associate- requires sitting, standing, walking   PATIENT GOALS: improve R LE strength  OBJECTIVE:     TODAY'S TREATMENT: 11/05/23 Activity Comments  Bike L3.5 LEs only 6 min  96% spO2, 81 bpm during warmup   mini squats with 10#  2x10  Reports LE muscle soreness; cues for wider BOS  sidestepping with TB above knees 1 min Lateral trunk  lean; in II bars  backwards walking 1 min No UE support  romberg EC 2x30"  In II bars; mild sway  romberg EC + head turns/nods 2x30"  In II bars; mild sway    PATIENT EDUCATION: Education details: discussed reassessment next session and possible recert vs. DC Person educated:  Patient Education method: Explanation Education comprehension: verbalized understanding    HOME EXERCISE PROGRAM Last updated: 10/29/23 Access Code: [email or web address] URL: [email or web address] Date: 10/29/2023 Prepared by: [facility] - Outpatient  Rehab - [facility]  Exercises - Standing Gastroc Stretch at Counter  - 1 x daily - 5 x weekly - 2 sets - 30 sec hold - Seated Hamstring Stretch  - 1 x daily - 5 x weekly - 2 sets - 30 sec hold - Standing Hip Abduction with Counter Support  - 1 x daily - 5 x weekly - 2 sets - 10 reps - Standing Hip Extension with Counter Support  - 1 x daily - 5 x weekly - 2 sets - 10 reps - Mini Squat with Counter Support  - 1 x daily - 5 x weekly - 2 sets - 10 reps - Side Stepping with Resistance at Ankles and Counter Support  - 1 x daily - 7 x weekly - 1 sets - 3-5 reps - Hip Flexor/Ankle strengthening  - 1 x daily - 7 x weekly - 1 sets - 3 reps - 30 sec hold - Tall Kneeling Hip Hinge  - 1 x daily - 7 x weekly - Seated Long Arc Quad with Ankle Weight  - 1 x daily - 5 x weekly - 2 sets - 10 reps - Sit to Stand  - 1 x daily - 5 x weekly - 2 sets - 10 reps      Note: Objective measures were completed at Evaluation unless otherwise noted.  DIAGNOSTIC FINDINGS: none recent  COGNITION: Overall cognitive status: Within functional limits for tasks assessed   SENSATION: Intact in B LEs to light touch   COORDINATION: Alternating pronation/supination: slightly slowed Alternating toe tap: B WNL Finger to nose: B WNL   POSTURE: rounded shoulders and forward head   *PALPATION: no TTP in B calf; slightly more soft tissue restriction on L  LOWER EXTREMITY ROM:     Active  Right Eval Left Eval  Hip flexion    Hip extension    Hip abduction    Hip adduction    Hip internal rotation    Hip external rotation    Knee flexion    Knee extension    Ankle dorsiflexion 14 *c/o pain in calf  10  Ankle plantarflexion    Ankle inversion     Ankle eversion     (Blank rows = not tested)  LOWER EXTREMITY MMT:    MMT Right Eval Left Eval  Hip flexion 4 4+  Hip extension    Hip abduction 4 4  Hip adduction 4 4  Hip internal rotation    Hip external rotation    Knee flexion 4 4+  Knee extension 4 4+  Ankle dorsiflexion 4+ 4+  Ankle plantarflexion 4+ 4+  Ankle inversion    Ankle eversion    (Blank rows = not tested)   GAIT: Gait pattern: Significant R-ward trunk lean with gait; slightly slowed gait speed  Assistive device utilized: None Level of assistance: Modified independence   FUNCTIONAL TESTS:    TODAY'S TREATMENT:  DATE:   PATIENT EDUCATION: Education details: prognosis, POC, HEP, answered pt's questions on leg press machine, edu on aquatic therapy as pt requests this Person educated: Patient Education method: Explanation, Demonstration, Tactile cues, Verbal cues, and Handouts Education comprehension: verbalized understanding and returned demonstration  HOME EXERCISE PROGRAM: Access Code: [email or web address] URL: [email or web address] Date: 08/13/2023 Prepared by: [facility] - Outpatient  Rehab - [facility]  Exercises - Standing Gastroc Stretch at Counter  - 1 x daily - 5 x weekly - 2 sets - 30 sec hold - Seated Hamstring Stretch  - 1 x daily - 5 x weekly - 2 sets - 30 sec hold - Standing Hip Abduction with Counter Support  - 1 x daily - 5 x weekly - 2 sets - 10 reps - Standing Hip Extension with Counter Support  - 1 x daily - 5 x weekly - 2 sets - 10 reps - Mini Squat with Counter Support  - 1 x daily - 5 x weekly - 2 sets - 10 reps  GOALS: Goals reviewed with patient? Yes   SHORT TERM GOALS: Target date: 09/03/2023>UPDATED 10/26/2023  Patient to be independent with initial HEP. Baseline: HEP initiated Goal status: MET12/07/2023  Pt will perform Condition 4  on MCTSIB for 30 seconds moderate or less sway Baseline:  Severe sway 20 sec Goal status:  INITIAL    LONG TERM GOALS: Target date: +09/24/2023>UPDATED TARGET 11/09/2023  Patient to be independent with advanced HEP. Baseline: Not yet initiated  Goal status: IN PROGRESS  Patient to demonstrate B LE strength >/=4+/5.  Baseline: See above Goal status: IN PROGRESS 09/28/2023  Patient to demonstrate L ankle dorsiflexion AROM to at least 14 deg in order to improve efficiency of gait.   Baseline: 10 deg>15 degrees Goal status: MET 09/28/2023  Patient to demonstrate safe stair navigation with alternating reciprocal pattern and no UE support.  Baseline: alternating reciprocal pattern with 1-2 UE support required ; continues to need UE support and alt pattern Goal status: IN PROGRESS 09/28/2023  Patient to score at least 20/24 on DGI in order to decrease risk of falls.  Baseline: 17>21/24 Goal status: MET1/23/2025  Patient to demonstrate 25% improvement in R-ward trunk lean with gait.  Baseline: significant R lean>slight improvement per report 09/28/2023 Goal status: IN PROGRESS 09/28/2023  Pt will improve FGA score to 22/30 for decreased fall risk. Baseline:  19/30 Goal status:  IN PROGRESS  Pt will report ability to push wife's wheelchair up and down ramp into home. Baseline:  Currently not able to do; nurse has to help Goal Status:  IN PROGRESS   ASSESSMENT:  CLINICAL IMPRESSION: Patient arrived to session with report of feeling tired today. Continue to progress resistance with LE strengthening on bike. Proceeded LE strengthening in standing focused on quad and hip muscle groups. Balance activities with EC appeared to improve with increased practice. Patient tolerated session well and without complaints at end of session.    OBJECTIVE IMPAIRMENTS: Abnormal gait, decreased balance, difficulty walking, decreased ROM, decreased strength, impaired flexibility, impaired sensation, postural  dysfunction, and pain.   ACTIVITY LIMITATIONS: carrying, lifting, bending, standing, squatting, stairs, transfers, bathing, toileting, dressing, locomotion level, and caring for others  PARTICIPATION LIMITATIONS: meal prep, cleaning, laundry, shopping, community activity, occupation, yard work, and church  PERSONAL FACTORS: Age, Past/current experiences, Time since onset of injury/illness/exacerbation, and 3+ comorbidities: HFrEF, atrial fibrillation, VF cardiac arrest s/p ICD, CAD, severe aortic regurgitation s/p AVR 10/2018 and aortic root replacement with CABGx1, aortic dilation, hypertension,  hypothyroidism, type 2 diabetes, multiple myeloma, CKD IIIb-IV, migraine  are also affecting patient's functional outcome.   REHAB POTENTIAL: Good  CLINICAL DECISION MAKING: Evolving/moderate complexity  EVALUATION COMPLEXITY: Moderate  PLAN:  PT FREQUENCY: 2x/week  PT DURATION: 6 weeks  PLANNED INTERVENTIONS: 97164- PT Re-evaluation, 97110-Therapeutic exercises, 97530- Therapeutic activity, 97112- Neuromuscular re-education, 97535- Self Care, 65784- Manual therapy, [phone]- Gait training, [phone]- Canalith repositioning, [phone]- Aquatic Therapy, Patient/Family education, Balance training, Stair training, Taping, Dry Needling, Joint mobilization, Spinal mobilization, Vestibular training, DME instructions, Cryotherapy, and Moist heat   PLAN FOR NEXT SESSION:  Warm up with recumbent bike for RLE flexibility; progress Bilat hip and R knee strength, stairs-step ups, step downs, step taps (consider adding to HEP), balance challenges on level/unlevel surfaces, treadmill for incline/decline   AQUATICS: Frequency: 1x/wk Duration: 6 wks Special Instruction: R LE strengthening, balance      Thaddeus Filippo, PT, DPT 11/05/23 4:15 PM  [place] at [facility] [address] [place], [place] [address] Phone # [phone] Fax # [phone]

## 2023-11-05 ENCOUNTER — Encounter: Payer: Self-pay | Admitting: Physical Therapy

## 2023-11-05 ENCOUNTER — Ambulatory Visit: Payer: Medicare Other | Admitting: Physical Therapy

## 2023-11-05 DIAGNOSIS — R2689 Other abnormalities of gait and mobility: Secondary | ICD-10-CM | POA: Diagnosis not present

## 2023-11-05 DIAGNOSIS — M6281 Muscle weakness (generalized): Secondary | ICD-10-CM | POA: Diagnosis not present

## 2023-11-05 DIAGNOSIS — R2681 Unsteadiness on feet: Secondary | ICD-10-CM

## 2023-11-07 NOTE — Therapy (Signed)
OUTPATIENT PHYSICAL THERAPY TREATMENT NOTE   Patient Name: Eric Papillion., MD MRN: [id number] DOB:1951/05/02, 73 y.o., male Today's Date: 11/07/2023   PCP:    Dorothyann Peng, MD   REFERRING PROVIDER: Chilton Si, MD   END OF SESSION:        Past Medical History:  Diagnosis Date   Acute blood loss anemia    Acute encephalopathy    Acute hypoxemic respiratory failure (HCC)    Acute idiopathic gout of right ankle    Acute lumbar back pain    Acute on chronic systolic heart failure (HCC)    Acute respiratory failure (HCC)    AKI (acute kidney injury) (HCC)    Altered mental status    Aortic aneurysm without rupture (HCC) 02/09/2016   Aortic root enlargement (HCC) 02/13/2012   Aortic valve regurgitation, acquired 02/13/2012   Arrhythmia    Atrial flutter (HCC) 03/18/2012   Back pain    Cardiac arrest (HCC) 02/01/2016   CHF (congestive heart failure) (HCC)    Chronic anticoagulation 03/04/2013   Chronic combined systolic and diastolic heart failure (HCC)    Chronic kidney disease    kidney fx studies increased    Chronic lower back pain    Chronic renal insufficiency    Chronic systolic heart failure (HCC) 02/13/2012   Recent diagnosis 4 / 2013, LVEF 25% by Echo 12/2011  03/2013: Echo at [facility] Conclusions: 1. Left ventricular ejection fraction estimated by 2D at 40-45 percent. 2. Mild concentric left ventricular hypertrophy. 3. Mild left atrial enlargement. 4. Moderate aortic valve regurgitation. 5. The aortic root at the sinus(es) of valsalva is moderately dilated 6. Mild mitral valve regurgitation. 7.   CKD (chronic kidney disease)    CKD (chronic kidney disease), stage IV (HCC) 08/06/2013   Creatinine 2.4 on 07/04/13    Claustrophobia    Colon cancer screening 03/04/2013   Debility 02/22/2016   Diabetes mellitus type 2 in nonobese [facility])    Diabetes mellitus type 2 in obese    Dysrhythmia    "palpitations"   Encounter for central line placement     Exertional dyspnea 01/2012   Femoral nerve injury 02/22/2016   Femoral neuropathy    Goals of care, counseling/discussion 05/05/2020   HCAP (healthcare-associated pneumonia)    Heart murmur    Hyperlipidemia 02/13/2012   Hypertension    Hypothyroidism    Internal hemorrhoids without mention of complication 04/11/2013   Labile blood pressure    Left bundle branch block 02/13/2012   Leg weakness, bilateral    Long term (current) use of anticoagulants 08/06/2013   Eliquis therapy    Long term current use of amiodarone 08/07/2016   Lower extremity weakness    Migraine 02/13/2012   "opthalmic"   Multiple myeloma (HCC) 05/05/2020   Non-traumatic rhabdomyolysis    Obesity (BMI 30-39.9) 02/13/2012   Pain    Paroxysmal atrial fibrillation (HCC)    Pneumonia    Retroperitoneal bleed    Right ankle pain    Right knee pain    Severe aortic regurgitation 02/09/2016   Special screening for malignant neoplasms, colon 04/11/2013   Thyroid activity decreased    Tibial pain    Varicose vein of leg    right   Ventricular fibrillation (HCC) 02/09/2016   Weakness of both lower extremities    Past Surgical History:  Procedure Laterality Date   AORTIC VALVE REPLACEMENT  11/20/2018   CARDIAC CATHETERIZATION N/A 02/17/2016   Procedure: Left Heart Cath and Coronary  Angiography;  Surgeon: Corky Crafts, MD;  Location: [facility] INVASIVE CV LAB;  Service: Cardiovascular;  Laterality: N/A;   CARDIOVERSION  03/22/2012   Procedure: CARDIOVERSION;  Surgeon: Donato Schultz, MD;  Location: [facility] ENDOSCOPY;  Service: Cardiovascular;  Laterality: N/A;   CARDIOVERSION  04/19/2012   Procedure: CARDIOVERSION;  Surgeon: Lesleigh Noe, MD;  Location: [facility] OR;  Service: Cardiovascular;  Laterality: N/A;   COLONOSCOPY N/A 04/11/2013   Procedure: COLONOSCOPY;  Surgeon: Louis Meckel, MD;  Location: [facility] ENDOSCOPY;  Service: Endoscopy;  Laterality: N/A;   COLONOSCOPY N/A 04/11/2013   Procedure: COLONOSCOPY;  Surgeon:  Louis Meckel, MD;  Location: [facility] ENDOSCOPY;  Service: Endoscopy;  Laterality: N/A;   EP IMPLANTABLE DEVICE N/A 02/17/2016   Procedure: BiV ICD Insertion CRT-D;  Surgeon: Marinus Maw, MD;  Location: [facility] INVASIVE CV LAB;  Service: Cardiovascular;  Laterality: N/A;   FINGER SURGERY  2012   "4th digit right hand; thumb on left hand"   RADIOLOGY WITH ANESTHESIA N/A 02/11/2016   Procedure: MRI OF THE BRAIN WITHOUT CONTRAST, LUMBAR WITHOUT CONTRAST;  Surgeon: Medication Radiologist, MD;  Location: [facility] OR;  Service: Radiology;  Laterality: N/A;  DR. WOOD/MRI   RIGHT/LEFT HEART CATH AND CORONARY ANGIOGRAPHY N/A 07/26/2018   Procedure: RIGHT/LEFT HEART CATH AND CORONARY ANGIOGRAPHY;  Surgeon: Lyn Records, MD;  Location: [facility] INVASIVE CV LAB;  Service: Cardiovascular;  Laterality: N/A;   Skin melanocytoma excision  2012   "above left clavicle"   STERNAL INCISION RECLOSURE  11/2018   STERNAL WIRE REMOVAL  11/2018   STERNAL WOUND DEBRIDEMENT  11/2018   TEE WITHOUT CARDIOVERSION  03/22/2012   Procedure: TRANSESOPHAGEAL ECHOCARDIOGRAM (TEE);  Surgeon: Donato Schultz, MD;  Location: [facility] ENDOSCOPY;  Service: Cardiovascular;  Laterality: N/A;   TEE WITHOUT CARDIOVERSION N/A 02/08/2016   Procedure: TRANSESOPHAGEAL ECHOCARDIOGRAM (TEE);  Surgeon: Lewayne Bunting, MD;  Location: [facility] ENDOSCOPY;  Service: Cardiovascular;  Laterality: N/A;   Patient Active Problem List   Diagnosis Date Noted   Enlarged prostate 09/10/2023   Dyslipidemia due to type 2 diabetes mellitus (HCC) 09/10/2023   Acquired thrombophilia (HCC) 05/14/2023   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   Goals of care, counseling/discussion 05/05/2020   Multiple myeloma (HCC) 05/05/2020   S/P flap graft 01/07/2019   Coronary artery disease 01/07/2019   Long term (current) use of antibiotics 01/03/2019   Essential hypertension 12/27/2018   Postoperative infection of wound of sternum 12/27/2018   Decreased strength, endurance, and mobility  12/27/2018   Cardiac LV ejection fraction 30-35% 12/27/2018   Decreased activities of daily living (ADL) 12/27/2018   Klebsiella infection 12/27/2018   S/P AVR (aortic valve replacement) 11/23/2018   S/P ascending aortic aneurysm repair 11/23/2018   NICM (nonischemic cardiomyopathy) (HCC) 11/19/2018   AICD (automatic cardioverter/defibrillator) present 11/19/2018   Other symptoms and signs involving the musculoskeletal system 05/08/2018   Long term current use of amiodarone 08/07/2016   Type 2 diabetes mellitus with stage 3b chronic kidney disease, without long-term current use of insulin (HCC)    Femoral neuropathy    Leg weakness, bilateral    Paroxysmal atrial fibrillation (HCC)    Ventricular fibrillation (HCC) 02/09/2016   Cardiac arrest (HCC) 02/01/2016   Thyroid activity decreased    Arrhythmia    CKD (chronic kidney disease), stage IV (HCC) 08/06/2013    Class: Chronic   Long term current use of anticoagulant 03/04/2013   Atrial flutter (HCC) 03/18/2012    Class: Acute   Chronic  systolic heart failure (HCC) 02/13/2012    Class: Acute   Hypertension, accelerated 02/13/2012   Hyperlipidemia 02/13/2012   Left bundle branch block 02/13/2012   Obesity (BMI 30-39.9) 02/13/2012    ONSET DATE: "quite some time" reports years   REFERRING DIAG:  G62.9 (ICD-10-CM) - Neuropathy Reprot THERAPY DIAG:  No diagnosis found.  Rationale for Evaluation and Treatment: Rehabilitation  SUBJECTIVE:                                                                                                                                                                                             SUBJECTIVE STATEMENT: Feeling tired today. Thighs are still sore from last session.    Pt accompanied by: self  PERTINENT HISTORY: HFrEF, atrial fibrillation, VF cardiac arrest s/p ICD, CAD, severe aortic regurgitation s/p AVR 10/2018 and aortic root replacement with CABGx1, aortic dilation, hypertension,  hypothyroidism, type 2 diabetes, multiple myeloma, CKD IIIb-IV, migraine  PAIN:  Are you having pain? Yes: NPRS scale: "no"/10 Pain location: R knee Pain description: tight, stiff  Aggravating factors: reports this is chronic; worse in AM Relieving factors: exercises in bed   PRECAUTIONS: Fall and ICD/Pacemaker  RED FLAGS: None   WEIGHT BEARING RESTRICTIONS: No  FALLS: Has patient fallen in last 6 months? No  LIVING ENVIRONMENT: Lives with: lives with their spouse; caregiver for his spouse and requires ability to transfer her Lives in: House/apartment Stairs:  4 steps to enter; 3 story home Has following equipment at home: Single point cane, Shower bench, and raised commode  PLOF: Independent; caregiver for wife; working part time as Ophthalmologist- requires sitting, standing, walking   PATIENT GOALS: improve R LE strength  OBJECTIVE:      TODAY'S TREATMENT: 11/09/23 Activity Comments                         HOME EXERCISE PROGRAM Last updated: 10/29/23 Access Code: [email or web address] URL: [email or web address] Date: 10/29/2023 Prepared by: [facility] - Outpatient  Rehab - [facility]  Exercises - Standing Gastroc Stretch at Counter  - 1 x daily - 5 x weekly - 2 sets - 30 sec hold - Seated Hamstring Stretch  - 1 x daily - 5 x weekly - 2 sets - 30 sec hold - Standing Hip Abduction with Counter Support  - 1 x daily - 5 x weekly - 2 sets - 10 reps - Standing Hip Extension with Counter Support  - 1 x daily - 5 x weekly - 2 sets - 10 reps - Mini Squat with Counter Support  - 1 x daily - 5 x  weekly - 2 sets - 10 reps - Side Stepping with Resistance at Ankles and Counter Support  - 1 x daily - 7 x weekly - 1 sets - 3-5 reps - Hip Flexor/Ankle strengthening  - 1 x daily - 7 x weekly - 1 sets - 3 reps - 30 sec hold - Tall Kneeling Hip Hinge  - 1 x daily - 7 x weekly - Seated Long Arc Quad with Ankle Weight  - 1 x daily - 5 x weekly - 2 sets - 10 reps - Sit  to Stand  - 1 x daily - 5 x weekly - 2 sets - 10 reps      Note: Objective measures were completed at Evaluation unless otherwise noted.  DIAGNOSTIC FINDINGS: none recent  COGNITION: Overall cognitive status: Within functional limits for tasks assessed   SENSATION: Intact in B LEs to light touch   COORDINATION: Alternating pronation/supination: slightly slowed Alternating toe tap: B WNL Finger to nose: B WNL   POSTURE: rounded shoulders and forward head   *PALPATION: no TTP in B calf; slightly more soft tissue restriction on L  LOWER EXTREMITY ROM:     Active  Right Eval Left Eval  Hip flexion    Hip extension    Hip abduction    Hip adduction    Hip internal rotation    Hip external rotation    Knee flexion    Knee extension    Ankle dorsiflexion 14 *c/o pain in calf  10  Ankle plantarflexion    Ankle inversion    Ankle eversion     (Blank rows = not tested)  LOWER EXTREMITY MMT:    MMT Right Eval Left Eval  Hip flexion 4 4+  Hip extension    Hip abduction 4 4  Hip adduction 4 4  Hip internal rotation    Hip external rotation    Knee flexion 4 4+  Knee extension 4 4+  Ankle dorsiflexion 4+ 4+  Ankle plantarflexion 4+ 4+  Ankle inversion    Ankle eversion    (Blank rows = not tested)   GAIT: Gait pattern: Significant R-ward trunk lean with gait; slightly slowed gait speed  Assistive device utilized: None Level of assistance: Modified independence   FUNCTIONAL TESTS:    TODAY'S TREATMENT:                                                                                                                              DATE:   PATIENT EDUCATION: Education details: prognosis, POC, HEP, answered pt's questions on leg press machine, edu on aquatic therapy as pt requests this Person educated: Patient Education method: Explanation, Demonstration, Tactile cues, Verbal cues, and Handouts Education comprehension: verbalized understanding and returned  demonstration  HOME EXERCISE PROGRAM: Access Code: [email or web address] URL: [email or web address] Date: 08/13/2023 Prepared by: [facility] - Outpatient  Rehab - [facility]  Exercises - Standing Gastroc Stretch at Counter  - 1 x  daily - 5 x weekly - 2 sets - 30 sec hold - Seated Hamstring Stretch  - 1 x daily - 5 x weekly - 2 sets - 30 sec hold - Standing Hip Abduction with Counter Support  - 1 x daily - 5 x weekly - 2 sets - 10 reps - Standing Hip Extension with Counter Support  - 1 x daily - 5 x weekly - 2 sets - 10 reps - Mini Squat with Counter Support  - 1 x daily - 5 x weekly - 2 sets - 10 reps  GOALS: Goals reviewed with patient? Yes   SHORT TERM GOALS: Target date: 09/03/2023>UPDATED 10/26/2023  Patient to be independent with initial HEP. Baseline: HEP initiated Goal status: MET12/07/2023  Pt will perform Condition 4 on MCTSIB for 30 seconds moderate or less sway Baseline:  Severe sway 20 sec Goal status:  INITIAL    LONG TERM GOALS: Target date: +09/24/2023>UPDATED TARGET 11/09/2023  Patient to be independent with advanced HEP. Baseline: Not yet initiated  Goal status: IN PROGRESS  Patient to demonstrate B LE strength >/=4+/5.  Baseline: See above Goal status: IN PROGRESS 09/28/2023  Patient to demonstrate L ankle dorsiflexion AROM to at least 14 deg in order to improve efficiency of gait.   Baseline: 10 deg>15 degrees Goal status: MET 09/28/2023  Patient to demonstrate safe stair navigation with alternating reciprocal pattern and no UE support.  Baseline: alternating reciprocal pattern with 1-2 UE support required ; continues to need UE support and alt pattern Goal status: IN PROGRESS 09/28/2023  Patient to score at least 20/24 on DGI in order to decrease risk of falls.  Baseline: 17>21/24 Goal status: MET1/23/2025  Patient to demonstrate 25% improvement in R-ward trunk lean with gait.  Baseline: significant R lean>slight improvement per report  09/28/2023 Goal status: IN PROGRESS 09/28/2023  Pt will improve FGA score to 22/30 for decreased fall risk. Baseline:  19/30 Goal status:  IN PROGRESS  Pt will report ability to push wife's wheelchair up and down ramp into home. Baseline:  Currently not able to do; nurse has to help Goal Status:  IN PROGRESS   ASSESSMENT:  CLINICAL IMPRESSION: Patient arrived to session with report of feeling tired today. Continue to progress resistance with LE strengthening on bike. Proceeded LE strengthening in standing focused on quad and hip muscle groups. Balance activities with EC appeared to improve with increased practice. Patient tolerated session well and without complaints at end of session.    OBJECTIVE IMPAIRMENTS: Abnormal gait, decreased balance, difficulty walking, decreased ROM, decreased strength, impaired flexibility, impaired sensation, postural dysfunction, and pain.   ACTIVITY LIMITATIONS: carrying, lifting, bending, standing, squatting, stairs, transfers, bathing, toileting, dressing, locomotion level, and caring for others  PARTICIPATION LIMITATIONS: meal prep, cleaning, laundry, shopping, community activity, occupation, yard work, and church  PERSONAL FACTORS: Age, Past/current experiences, Time since onset of injury/illness/exacerbation, and 3+ comorbidities: HFrEF, atrial fibrillation, VF cardiac arrest s/p ICD, CAD, severe aortic regurgitation s/p AVR 10/2018 and aortic root replacement with CABGx1, aortic dilation, hypertension, hypothyroidism, type 2 diabetes, multiple myeloma, CKD IIIb-IV, migraine  are also affecting patient's functional outcome.   REHAB POTENTIAL: Good  CLINICAL DECISION MAKING: Evolving/moderate complexity  EVALUATION COMPLEXITY: Moderate  PLAN:  PT FREQUENCY: 2x/week  PT DURATION: 6 weeks  PLANNED INTERVENTIONS: 97164- PT Re-evaluation, 97110-Therapeutic exercises, 97530- Therapeutic activity, 97112- Neuromuscular re-education, 97535- Self Care,  97140- Manual therapy, [id number]- Gait training, 72536- Canalith repositioning, 64403- Aquatic Therapy, Patient/Family education, Balance training, Stair training, Taping, Dry  Needling, Joint mobilization, Spinal mobilization, Vestibular training, DME instructions, Cryotherapy, and Moist heat   PLAN FOR NEXT SESSION:  Warm up with recumbent bike for RLE flexibility; progress Bilat hip and R knee strength, stairs-step ups, step downs, step taps (consider adding to HEP), balance challenges on level/unlevel surfaces, treadmill for incline/decline   AQUATICS: Frequency: 1x/wk Duration: 6 wks Special Instruction: R LE strengthening, balance      Baldemar Friday, PT, DPT 11/07/23 5:42 PM  [facility] at [facility] [address] [place], [place] [address] Phone # [phone] Fax # [phone]

## 2023-11-09 ENCOUNTER — Ambulatory Visit: Payer: Medicare Other | Admitting: Physical Therapy

## 2023-11-09 ENCOUNTER — Encounter: Payer: Self-pay | Admitting: Physical Therapy

## 2023-11-09 DIAGNOSIS — M6281 Muscle weakness (generalized): Secondary | ICD-10-CM | POA: Diagnosis not present

## 2023-11-09 DIAGNOSIS — R2689 Other abnormalities of gait and mobility: Secondary | ICD-10-CM

## 2023-11-09 DIAGNOSIS — R2681 Unsteadiness on feet: Secondary | ICD-10-CM | POA: Diagnosis not present

## 2023-11-16 ENCOUNTER — Ambulatory Visit: Payer: Medicare Other | Admitting: Physical Therapy

## 2023-11-16 ENCOUNTER — Encounter: Payer: Self-pay | Admitting: Physical Therapy

## 2023-11-16 DIAGNOSIS — R2689 Other abnormalities of gait and mobility: Secondary | ICD-10-CM | POA: Diagnosis not present

## 2023-11-16 DIAGNOSIS — M6281 Muscle weakness (generalized): Secondary | ICD-10-CM | POA: Diagnosis not present

## 2023-11-16 DIAGNOSIS — R2681 Unsteadiness on feet: Secondary | ICD-10-CM

## 2023-11-16 NOTE — Therapy (Signed)
OUTPATIENT PHYSICAL THERAPY TREATMENT NOTE   Patient Name: Eric [name]., MD MRN: [id number] DOB:06/12/1951, 73 y.o., male Today's Date: 11/16/2023   PCP:    Dorothyann Peng, MD   REFERRING PROVIDER: Chilton Si, MD     END OF SESSION:  PT End of Session - 11/16/23 0943     Visit Number 20    Number of Visits 25    Date for PT Re-Evaluation 12/21/23    Authorization Type Medicare/[employer]    Progress Note Due on Visit --   PN was done on Visit 19   PT Start Time 0941   pt arrives late   PT Stop Time 1013    PT Time Calculation (min) 32 min    Equipment Utilized During Treatment Gait belt    Activity Tolerance Patient tolerated treatment well    Behavior During Therapy WFL for tasks assessed/performed                   Past Medical History:  Diagnosis Date   Acute blood loss anemia    Acute encephalopathy    Acute hypoxemic respiratory failure (HCC)    Acute idiopathic gout of right ankle    Acute lumbar back pain    Acute on chronic systolic heart failure (HCC)    Acute respiratory failure (HCC)    AKI (acute kidney injury) (HCC)    Altered mental status    Aortic aneurysm without rupture (HCC) 02/09/2016   Aortic root enlargement (HCC) 02/13/2012   Aortic valve regurgitation, acquired 02/13/2012   Arrhythmia    Atrial flutter (HCC) 03/18/2012   Back pain    Cardiac arrest (HCC) 02/01/2016   CHF (congestive heart failure) (HCC)    Chronic anticoagulation 03/04/2013   Chronic combined systolic and diastolic heart failure (HCC)    Chronic kidney disease    kidney fx studies increased    Chronic lower back pain    Chronic renal insufficiency    Chronic systolic heart failure (HCC) 02/13/2012   Recent diagnosis 4 / 2013, LVEF 25% by Echo 12/2011  03/2013: Echo at [facility] Cardiology Conclusions: 1. Left ventricular ejection fraction estimated by 2D at 40-45 percent. 2. Mild concentric left ventricular hypertrophy. 3. Mild left atrial enlargement. 4.  Moderate aortic valve regurgitation. 5. The aortic root at the sinus(es) of valsalva is moderately dilated 6. Mild mitral valve regurgitation. 7.   CKD (chronic kidney disease)    CKD (chronic kidney disease), stage IV (HCC) 08/06/2013   Creatinine 2.4 on 07/04/13    Claustrophobia    Colon cancer screening 03/04/2013   Debility 02/22/2016   Diabetes mellitus type 2 in nonobese [facility])    Diabetes mellitus type 2 in obese    Dysrhythmia    "palpitations"   Encounter for central line placement    Exertional dyspnea 01/2012   Femoral nerve injury 02/22/2016   Femoral neuropathy    Goals of care, counseling/discussion 05/05/2020   HCAP (healthcare-associated pneumonia)    Heart murmur    Hyperlipidemia 02/13/2012   Hypertension    Hypothyroidism    Internal hemorrhoids without mention of complication 04/11/2013   Labile blood pressure    Left bundle branch block 02/13/2012   Leg weakness, bilateral    Long term (current) use of anticoagulants 08/06/2013   Eliquis therapy    Long term current use of amiodarone 08/07/2016   Lower extremity weakness    Migraine 02/13/2012   "opthalmic"   Multiple myeloma (HCC) 05/05/2020   Non-traumatic  rhabdomyolysis    Obesity (BMI 30-39.9) 02/13/2012   Pain    Paroxysmal atrial fibrillation (HCC)    Pneumonia    Retroperitoneal bleed    Right ankle pain    Right knee pain    Severe aortic regurgitation 02/09/2016   Special screening for malignant neoplasms, colon 04/11/2013   Thyroid activity decreased    Tibial pain    Varicose vein of leg    right   Ventricular fibrillation (HCC) 02/09/2016   Weakness of both lower extremities    Past Surgical History:  Procedure Laterality Date   AORTIC VALVE REPLACEMENT  11/20/2018   CARDIAC CATHETERIZATION N/A 02/17/2016   Procedure: Left Heart Cath and Coronary Angiography;  Surgeon: Corky Crafts, MD;  Location: [facility] INVASIVE CV LAB;  Service: Cardiovascular;  Laterality: N/A;    CARDIOVERSION  03/22/2012   Procedure: CARDIOVERSION;  Surgeon: Donato Schultz, MD;  Location: [facility] ENDOSCOPY;  Service: Cardiovascular;  Laterality: N/A;   CARDIOVERSION  04/19/2012   Procedure: CARDIOVERSION;  Surgeon: Lesleigh Noe, MD;  Location: [facility] OR;  Service: Cardiovascular;  Laterality: N/A;   COLONOSCOPY N/A 04/11/2013   Procedure: COLONOSCOPY;  Surgeon: Louis Meckel, MD;  Location: [facility] ENDOSCOPY;  Service: Endoscopy;  Laterality: N/A;   COLONOSCOPY N/A 04/11/2013   Procedure: COLONOSCOPY;  Surgeon: Louis Meckel, MD;  Location: [facility] ENDOSCOPY;  Service: Endoscopy;  Laterality: N/A;   EP IMPLANTABLE DEVICE N/A 02/17/2016   Procedure: BiV ICD Insertion CRT-D;  Surgeon: Marinus Maw, MD;  Location: [facility] INVASIVE CV LAB;  Service: Cardiovascular;  Laterality: N/A;   FINGER SURGERY  2012   "4th digit right hand; thumb on left hand"   RADIOLOGY WITH ANESTHESIA N/A 02/11/2016   Procedure: MRI OF THE BRAIN WITHOUT CONTRAST, LUMBAR WITHOUT CONTRAST;  Surgeon: Medication Radiologist, MD;  Location: [facility] OR;  Service: Radiology;  Laterality: N/A;  DR. WOOD/MRI   RIGHT/LEFT HEART CATH AND CORONARY ANGIOGRAPHY N/A 07/26/2018   Procedure: RIGHT/LEFT HEART CATH AND CORONARY ANGIOGRAPHY;  Surgeon: Lyn Records, MD;  Location: [facility] INVASIVE CV LAB;  Service: Cardiovascular;  Laterality: N/A;   Skin melanocytoma excision  2012   "above left clavicle"   STERNAL INCISION RECLOSURE  11/2018   STERNAL WIRE REMOVAL  11/2018   STERNAL WOUND DEBRIDEMENT  11/2018   TEE WITHOUT CARDIOVERSION  03/22/2012   Procedure: TRANSESOPHAGEAL ECHOCARDIOGRAM (TEE);  Surgeon: Donato Schultz, MD;  Location: [facility] ENDOSCOPY;  Service: Cardiovascular;  Laterality: N/A;   TEE WITHOUT CARDIOVERSION N/A 02/08/2016   Procedure: TRANSESOPHAGEAL ECHOCARDIOGRAM (TEE);  Surgeon: Lewayne Bunting, MD;  Location: [facility] ENDOSCOPY;  Service: Cardiovascular;  Laterality: N/A;   Patient Active Problem List   Diagnosis Date Noted   Enlarged prostate  09/10/2023   Dyslipidemia due to type 2 diabetes mellitus (HCC) 09/10/2023   Acquired thrombophilia (HCC) 05/14/2023   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   Goals of care, counseling/discussion 05/05/2020   Multiple myeloma (HCC) 05/05/2020   S/P flap graft 01/07/2019   Coronary artery disease 01/07/2019   Long term (current) use of antibiotics 01/03/2019   Essential hypertension 12/27/2018   Postoperative infection of wound of sternum 12/27/2018   Decreased strength, endurance, and mobility 12/27/2018   Cardiac LV ejection fraction 30-35% 12/27/2018   Decreased activities of daily living (ADL) 12/27/2018   Klebsiella infection 12/27/2018   S/P AVR (aortic valve replacement) 11/23/2018   S/P ascending aortic aneurysm repair 11/23/2018   NICM (nonischemic cardiomyopathy) (HCC) 11/19/2018   AICD (automatic cardioverter/defibrillator)  present 11/19/2018   Other symptoms and signs involving the musculoskeletal system 05/08/2018   Long term current use of amiodarone 08/07/2016   Type 2 diabetes mellitus with stage 3b chronic kidney disease, without long-term current use of insulin (HCC)    Femoral neuropathy    Leg weakness, bilateral    Paroxysmal atrial fibrillation (HCC)    Ventricular fibrillation (HCC) 02/09/2016   Cardiac arrest (HCC) 02/01/2016   Thyroid activity decreased    Arrhythmia    CKD (chronic kidney disease), stage IV (HCC) 08/06/2013    Class: Chronic   Long term current use of anticoagulant 03/04/2013   Atrial flutter (HCC) 03/18/2012    Class: Acute   Chronic systolic heart failure (HCC) 02/13/2012    Class: Acute   Hypertension, accelerated 02/13/2012   Hyperlipidemia 02/13/2012   Left bundle branch block 02/13/2012   Obesity (BMI 30-39.9) 02/13/2012    ONSET DATE: "quite some time" reports years   REFERRING DIAG:  G62.9 (ICD-10-CM) - Neuropathy Reprot THERAPY DIAG:  Unsteadiness on feet  Muscle weakness (generalized)  Other  abnormalities of gait and mobility  Rationale for Evaluation and Treatment: Rehabilitation  SUBJECTIVE:                                                                                                                                                                                             SUBJECTIVE STATEMENT: Doing okay.  A little back pain due to sleeping some nights in the hospital chair with wife.      Pt accompanied by: self  PERTINENT HISTORY: HFrEF, atrial fibrillation, VF cardiac arrest s/p ICD, CAD, severe aortic regurgitation s/p AVR 10/2018 and aortic root replacement with CABGx1, aortic dilation, hypertension, hypothyroidism, type 2 diabetes, multiple myeloma, CKD IIIb-IV, migraine  PAIN:  Are you having pain? Yes: NPRS scale: 3/10 Pain location: R knee Pain description: tight, stiff, numb  Aggravating factors: reports this is chronic; worse in AM Relieving factors: exercises in bed   PRECAUTIONS: Fall and ICD/Pacemaker  RED FLAGS: None   WEIGHT BEARING RESTRICTIONS: No  FALLS: Has patient fallen in last 6 months? No  LIVING ENVIRONMENT: Lives with: lives with their spouse; caregiver for his spouse and requires ability to transfer her Lives in: House/apartment Stairs:  4 steps to enter; 3 story home Has following equipment at home: Single point cane, Shower bench, and raised commode  PLOF: Independent; caregiver for wife; working part time as Ophthalmologist- requires sitting, standing, walking   PATIENT GOALS: improve R LE strength  OBJECTIVE:     TODAY'S TREATMENT: 11/16/2023 Activity Comments  NuStep, Level 4, 4 extremities x 6 minutes  Flexibility and strength; SPM >75  Review tandem gait and EC walk along counter   Step taps Step ups Step downs 4", 6" step BUE support for step ups  Runner's stretch with bias for R hip stretch   Tandem gait forward/back in parallel bars BUE support in backward direction                  HOME EXERCISE  PROGRAM Last updated: 11/09/23 Access Code: [email or web address] URL: [email or web address] Date: 11/09/2023 Prepared by: [facility] - Outpatient  Rehab - [facility]  Exercises - Standing Hip Abduction with Counter Support  - 1 x daily - 5 x weekly - 2 sets - 10 reps - Side Stepping with Resistance at Ankles and Counter Support  - 1 x daily - 7 x weekly - 1 sets - 3-5 reps - Sidelying Hip Adduction  - 1 x daily - 5 x weekly - 2 sets - 10 reps - Hip Flexor/Ankle strengthening  - 1 x daily - 7 x weekly - 1 sets - 3 reps - 30 sec hold - Tall Kneeling Hip Hinge  - 1 x daily - 7 x weekly - Seated Long Arc Quad with Ankle Weight  - 1 x daily - 5 x weekly - 2 sets - 10 reps - Sit to Stand  - 1 x daily - 5 x weekly - 2 sets - 10 reps - Step Up  - 1 x daily - 7 x weekly - 3 sets - 10 reps - Tandem Walking with Counter Support  - 1 x daily - 5 x weekly - 2 sets - 5 reps - Walking with Eyes Closed and Counter Support  - 1 x daily - 5 x weekly - 2 sets - 5 reps    PATIENT EDUCATION: Education details:11/16/2023:  Review of fall prevention in lieu of exercises to take out vision-make sure plenty of light at night, unfamiliar surroundings  Person educated: Patient Education method: Explanation, Demonstration, Tactile cues, Verbal cues, and Handouts Education comprehension: verbalized understanding and returned demonstration      Note: Objective measures were completed at Evaluation unless otherwise noted.  DIAGNOSTIC FINDINGS: none recent  COGNITION: Overall cognitive status: Within functional limits for tasks assessed   SENSATION: Intact in B LEs to light touch   COORDINATION: Alternating pronation/supination: slightly slowed Alternating toe tap: B WNL Finger to nose: B WNL   POSTURE: rounded shoulders and forward head   *PALPATION: no TTP in B calf; slightly more soft tissue restriction on L  LOWER EXTREMITY ROM:     Active  Right Eval Left Eval  Hip flexion    Hip  extension    Hip abduction    Hip adduction    Hip internal rotation    Hip external rotation    Knee flexion    Knee extension    Ankle dorsiflexion 14 *c/o pain in calf  10  Ankle plantarflexion    Ankle inversion    Ankle eversion     (Blank rows = not tested)  LOWER EXTREMITY MMT:    MMT Right Eval Left Eval  Hip flexion 4 4+  Hip extension    Hip abduction 4 4  Hip adduction 4 4  Hip internal rotation    Hip external rotation    Knee flexion 4 4+  Knee extension 4 4+  Ankle dorsiflexion 4+ 4+  Ankle plantarflexion 4+ 4+  Ankle inversion    Ankle eversion    (Blank rows =  not tested)   GAIT: Gait pattern: Significant R-ward trunk lean with gait; slightly slowed gait speed  Assistive device utilized: None Level of assistance: Modified independence   FUNCTIONAL TESTS:     TODAY'S TREATMENT:                                                                                                                              DATE:   PATIENT EDUCATION: Education details: prognosis, POC, HEP, answered pt's questions on leg press machine, edu on aquatic therapy as pt requests this Person educated: Patient Education method: Explanation, Demonstration, Tactile cues, Verbal cues, and Handouts Education comprehension: verbalized understanding and returned demonstration  HOME EXERCISE PROGRAM: Access Code: [email or web address] URL: [email or web address] Date: 08/13/2023 Prepared by: [facility] - Outpatient  Rehab - [facility]  Exercises - Standing Gastroc Stretch at Counter  - 1 x daily - 5 x weekly - 2 sets - 30 sec hold - Seated Hamstring Stretch  - 1 x daily - 5 x weekly - 2 sets - 30 sec hold - Standing Hip Abduction with Counter Support  - 1 x daily - 5 x weekly - 2 sets - 10 reps - Standing Hip Extension with Counter Support  - 1 x daily - 5 x weekly - 2 sets - 10 reps - Mini Squat with Counter Support  - 1 x daily - 5 x weekly - 2 sets - 10  reps  GOALS: Goals reviewed with patient? Yes   SHORT TERM GOALS: Target date: 10/26/2023  Patient to be independent with initial HEP. Baseline: HEP initiated Goal status: MET12/07/2023  Pt will perform Condition 4 on MCTSIB for 30 seconds moderate or less sway Baseline:  Severe sway 20 sec; mild-mod sway 30 sec 11/09/23 Goal status:  MET 11/09/23     LONG TERM GOALS: Target date: 12/21/23  Patient to be independent with advanced HEP. Baseline: Not yet initiated; reports "not doing all the things, but some of them about 1x/day" Goal status: IN PROGRESS 11/09/23  Patient to demonstrate B LE strength >/=4+/5.  Baseline: See above; improved but not yet met  11/09/23 Goal status: IN PROGRESS  11/09/23  Patient to demonstrate L ankle dorsiflexion AROM to at least 14 deg in order to improve efficiency of gait.   Baseline: 10 deg>15 degrees Goal status: MET 09/28/2023  Patient to demonstrate safe stair navigation with alternating reciprocal pattern and no UE support.  Baseline: alternating reciprocal pattern with 1-2 UE support required ; continues to need UE support and alt pattern; able to perform without UE support but with limited eccentric control  11/09/23 Goal status: IN PROGRESS  11/09/23  Patient to score at least 20/24 on DGI in order to decrease risk of falls.  Baseline: 17>21/24 Goal status: MET1/23/2025  Patient to demonstrate 25% improvement in R-ward trunk lean with gait.  Baseline: significant R lean>slight improvement per report 09/28/2023; unchanged  11/09/23 Goal status: IN PROGRESS  11/09/23  Pt  will improve FGA score to 22/30 for decreased fall risk. Baseline:  19/30; 20/30  11/09/23 Goal status:  IN PROGRESS  11/09/23  Pt will report ability to push wife's wheelchair up and down ramp into home. Baseline:  Currently not able to do; nurse has to help; wife in hospital currently  11/09/23 Goal Status: DEFERRED  11/09/23   ASSESSMENT:  CLINICAL IMPRESSION: Pt presents  today with reports of completing new exercises a few times in past week. Skilled PT session focused on strength, flexibility, and balance. Reviewed updates to HEP from last visit and worked on SLS and step down activity.  Pt needs UE support for improved control with descent of step downs.  Pt will continue to benefit from skilled PT towards goals for improved functional mobility and decreased fall risk.     OBJECTIVE IMPAIRMENTS: Abnormal gait, decreased balance, difficulty walking, decreased ROM, decreased strength, impaired flexibility, impaired sensation, postural dysfunction, and pain.   ACTIVITY LIMITATIONS: carrying, lifting, bending, standing, squatting, stairs, transfers, bathing, toileting, dressing, locomotion level, and caring for others  PARTICIPATION LIMITATIONS: meal prep, cleaning, laundry, shopping, community activity, occupation, yard work, and church  PERSONAL FACTORS: Age, Past/current experiences, Time since onset of injury/illness/exacerbation, and 3+ comorbidities: HFrEF, atrial fibrillation, VF cardiac arrest s/p ICD, CAD, severe aortic regurgitation s/p AVR 10/2018 and aortic root replacement with CABGx1, aortic dilation, hypertension, hypothyroidism, type 2 diabetes, multiple myeloma, CKD IIIb-IV, migraine  are also affecting patient's functional outcome.   REHAB POTENTIAL: Good  CLINICAL DECISION MAKING: Evolving/moderate complexity  EVALUATION COMPLEXITY: Moderate  PLAN:  PT FREQUENCY: 1x/week  PT DURATION: 6 weeks  PLANNED INTERVENTIONS: 97164- PT Re-evaluation, 97110-Therapeutic exercises, 97530- Therapeutic activity, 97112- Neuromuscular re-education, 97535- Self Care, [id number]- Manual therapy, [phone]- Gait training, [phone]- Canalith repositioning, [phone]- Aquatic Therapy, Patient/Family education, Balance training, Stair training, Taping, Dry Needling, Joint mobilization, Spinal mobilization, Vestibular training, DME instructions, Cryotherapy, and Moist heat   PLAN  FOR NEXT SESSION:  Warm up with recumbent bike for RLE flexibility; progress Bilat hip and R knee strength, stairs-step ups, step downs, step taps (consider adding to HEP), balance challenges on level/unlevel surfaces    Lonia Blood, PT 11/16/23 10:12 AM Phone: [phone] Fax: [phone]  [facility] at [facility] [address] [place], [place] [address] Phone # [phone] Fax # [phone]

## 2023-11-21 DIAGNOSIS — N401 Enlarged prostate with lower urinary tract symptoms: Secondary | ICD-10-CM | POA: Diagnosis not present

## 2023-11-21 DIAGNOSIS — R351 Nocturia: Secondary | ICD-10-CM | POA: Diagnosis not present

## 2023-11-21 DIAGNOSIS — N5201 Erectile dysfunction due to arterial insufficiency: Secondary | ICD-10-CM | POA: Diagnosis not present

## 2023-11-21 NOTE — Therapy (Signed)
 OUTPATIENT PHYSICAL THERAPY TREATMENT NOTE   Patient Name: Eric Goody., MD MRN: [id number] DOB:1951/05/22, 73 y.o., male Today's Date: 11/23/2023   PCP:    Dorothyann Peng, MD   REFERRING PROVIDER: Chilton Si, MD     END OF SESSION:  PT End of Session - 11/23/23 1014     Visit Number 21    Number of Visits 25    Date for PT Re-Evaluation 12/21/23    Authorization Type Medicare/[employer]    Progress Note Due on Visit --   PN was done on Visit 19   PT Start Time 0935    PT Stop Time 1013    PT Time Calculation (min) 38 min    Equipment Utilized During Treatment Gait belt    Activity Tolerance Patient tolerated treatment well    Behavior During Therapy WFL for tasks assessed/performed                    Past Medical History:  Diagnosis Date   Acute blood loss anemia    Acute encephalopathy    Acute hypoxemic respiratory failure (HCC)    Acute idiopathic gout of right ankle    Acute lumbar back pain    Acute on chronic systolic heart failure (HCC)    Acute respiratory failure (HCC)    AKI (acute kidney injury) (HCC)    Altered mental status    Aortic aneurysm without rupture (HCC) 02/09/2016   Aortic root enlargement (HCC) 02/13/2012   Aortic valve regurgitation, acquired 02/13/2012   Arrhythmia    Atrial flutter (HCC) 03/18/2012   Back pain    Cardiac arrest (HCC) 02/01/2016   CHF (congestive heart failure) (HCC)    Chronic anticoagulation 03/04/2013   Chronic combined systolic and diastolic heart failure (HCC)    Chronic kidney disease    kidney fx studies increased    Chronic lower back pain    Chronic renal insufficiency    Chronic systolic heart failure (HCC) 02/13/2012   Recent diagnosis 4 / 2013, LVEF 25% by Echo 12/2011  03/2013: Echo at [facility] Card[facility] Conclusions: 1. Left ventricular ejection fraction estimated by 2D at 40-45 percent. 2. Mild concentric left ventricular hypertrophy. 3. Mild left atrial enlargement. 4. Moderate aortic  valve regurgitation. 5. The aortic root at the sinus(es) of valsalva is moderately dilated 6. Mild mitral valve regurgitation. 7.   CKD (chronic kidney disease)    CKD (chronic kidney disease), stage IV (HCC) 08/06/2013   Creatinine 2.4 on 07/04/13    Claustrophobia    Colon cancer screening 03/04/2013   Debility 02/22/2016   Diabetes mellitus type 2 in nonobese [facility])    Diabetes mellitus type 2 in obese    Dysrhythmia    "palpitations"   Encounter for central line placement    Exertional dyspnea 01/2012   Femoral nerve injury 02/22/2016   Femoral neuropathy    Goals of care, counseling/discussion 05/05/2020   HCAP (healthcare-associated pneumonia)    Heart murmur    Hyperlipidemia 02/13/2012   Hypertension    Hypothyroidism    Internal hemorrhoids without mention of complication 04/11/2013   Labile blood pressure    Left bundle branch block 02/13/2012   Leg weakness, bilateral    Long term (current) use of anticoagulants 08/06/2013   Eliquis therapy    Long term current use of amiodarone 08/07/2016   Lower extremity weakness    Migraine 02/13/2012   "opthalmic"   Multiple myeloma (HCC) 05/05/2020   Non-traumatic rhabdomyolysis  Obesity (BMI 30-39.9) 02/13/2012   Pain    Paroxysmal atrial fibrillation (HCC)    Pneumonia    Retroperitoneal bleed    Right ankle pain    Right knee pain    Severe aortic regurgitation 02/09/2016   Special screening for malignant neoplasms, colon 04/11/2013   Thyroid activity decreased    Tibial pain    Varicose vein of leg    right   Ventricular fibrillation (HCC) 02/09/2016   Weakness of both lower extremities    Past Surgical History:  Procedure Laterality Date   AORTIC VALVE REPLACEMENT  11/20/2018   CARDIAC CATHETERIZATION N/A 02/17/2016   Procedure: Left Heart Cath and Coronary Angiography;  Surgeon: Corky Crafts, MD;  Location[facility] INVASIVE CV LAB;  Service: Cardiovascular;  Laterality: N/A;   CARDIOVERSION  03/22/2012    Procedure: CARDIOVERSION;  Surgeon: Donato Schultz, MD;  Location: [facility] ENDOSCOPY;  Service: Cardiovascular;  Laterality: N/A;   CARDIOVERSION  04/19/2012   Procedure: CARDIOVERSION;  Surgeon: Lesleigh Noe, MD;  Location: [facility] OR;  Service: Cardiovascular;  Laterality: N/A;   COLONOSCOPY N/A 04/11/2013   Procedure: COLONOSCOPY;  Surgeon: Louis Meckel, MD;  Location: [facility] ENDOSCOPY;  Service: Endoscopy;  Laterality: N/A;   COLONOSCOPY N/A 04/11/2013   Procedure: COLONOSCOPY;  Surgeon: Louis Meckel, MD;  Location: [facility] ENDOSCOPY;  Service: Endoscopy;  Laterality: N/A;   EP IMPLANTABLE DEVICE N/A 02/17/2016   Procedure: BiV ICD Insertion CRT-D;  Surgeon: Marinus Maw, MD;  Location: [facility] INVASIVE CV LAB;  Service: Cardiovascular;  Laterality: N/A;   FINGER SURGERY  2012   "4th digit right hand; thumb on left hand"   RADIOLOGY WITH ANESTHESIA N/A 02/11/2016   Procedure: MRI OF THE BRAIN WITHOUT CONTRAST, LUMBAR WITHOUT CONTRAST;  Surgeon: Medication Radiologist, MD;  Location: [facility] OR;  Service: Radiology;  Laterality: N/A;  DR. WOOD/MRI   RIGHT/LEFT HEART CATH AND CORONARY ANGIOGRAPHY N/A 07/26/2018   Procedure: RIGHT/LEFT HEART CATH AND CORONARY ANGIOGRAPHY;  Surgeon: Lyn Records, MD;  Location: [facility] INVASIVE CV LAB;  Service: Cardiovascular;  Laterality: N/A;   Skin melanocytoma excision  2012   "above left clavicle"   STERNAL INCISION RECLOSURE  11/2018   STERNAL WIRE REMOVAL  11/2018   STERNAL WOUND DEBRIDEMENT  11/2018   TEE WITHOUT CARDIOVERSION  03/22/2012   Procedure: TRANSESOPHAGEAL ECHOCARDIOGRAM (TEE);  Surgeon: Donato Schultz, MD;  Location: [facility] ENDOSCOPY;  Service: Cardiovascular;  Laterality: N/A;   TEE WITHOUT CARDIOVERSION N/A 02/08/2016   Procedure: TRANSESOPHAGEAL ECHOCARDIOGRAM (TEE);  Surgeon: Lewayne Bunting, MD;  Location: [facility] ENDOSCOPY;  Service: Cardiovascular;  Laterality: N/A;   Patient Active Problem List   Diagnosis Date Noted   Enlarged prostate 09/10/2023   Dyslipidemia due  to type 2 diabetes mellitus (HCC) 09/10/2023   Acquired thrombophilia (HCC) 05/14/2023   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   Goals of care, counseling/discussion 05/05/2020   Multiple myeloma (HCC) 05/05/2020   S/P flap graft 01/07/2019   Coronary artery disease 01/07/2019   Long term (current) use of antibiotics 01/03/2019   Essential hypertension 12/27/2018   Postoperative infection of wound of sternum 12/27/2018   Decreased strength, endurance, and mobility 12/27/2018   Cardiac LV ejection fraction 30-35% 12/27/2018   Decreased activities of daily living (ADL) 12/27/2018   Klebsiella infection 12/27/2018   S/P AVR (aortic valve replacement) 11/23/2018   S/P ascending aortic aneurysm repair 11/23/2018   NICM (nonischemic cardiomyopathy) (HCC) 11/19/2018   AICD (automatic cardioverter/defibrillator) present 11/19/2018  Other symptoms and signs involving the musculoskeletal system 05/08/2018   Long term current use of amiodarone 08/07/2016   Type 2 diabetes mellitus with stage 3b chronic kidney disease, without long-term current use of insulin (HCC)    Femoral neuropathy    Leg weakness, bilateral    Paroxysmal atrial fibrillation (HCC)    Ventricular fibrillation (HCC) 02/09/2016   Cardiac arrest (HCC) 02/01/2016   Thyroid activity decreased    Arrhythmia    CKD (chronic kidney disease), stage IV (HCC) 08/06/2013    Class: Chronic   Long term current use of anticoagulant 03/04/2013   Atrial flutter (HCC) 03/18/2012    Class: Acute   Chronic systolic heart failure (HCC) 02/13/2012    Class: Acute   Hypertension, accelerated 02/13/2012   Hyperlipidemia 02/13/2012   Left bundle branch block 02/13/2012   Obesity (BMI 30-39.9) 02/13/2012    ONSET DATE: "quite some time" reports years   REFERRING DIAG:  G62.9 (ICD-10-CM) - Neuropathy Reprot THERAPY DIAG:  Unsteadiness on feet  Muscle weakness (generalized)  Other abnormalities of gait and  mobility  Rationale for Evaluation and Treatment: Rehabilitation  SUBJECTIVE:                                                                                                                                                                                             SUBJECTIVE STATEMENT: Nothing new, just some tightness in the quads on the R.     Pt accompanied by: self  PERTINENT HISTORY: HFrEF, atrial fibrillation, VF cardiac arrest s/p ICD, CAD, severe aortic regurgitation s/p AVR 10/2018 and aortic root replacement with CABGx1, aortic dilation, hypertension, hypothyroidism, type 2 diabetes, multiple myeloma, CKD IIIb-IV, migraine  PAIN:  Are you having pain? Yes: NPRS scale: 0/10 Pain location: R knee Pain description: tight, stiff, numb  Aggravating factors: reports this is chronic; worse in AM Relieving factors: exercises in bed   PRECAUTIONS: Fall and ICD/Pacemaker  RED FLAGS: None   WEIGHT BEARING RESTRICTIONS: No  FALLS: Has patient fallen in last 6 months? No  LIVING ENVIRONMENT: Lives with: lives with their spouse; caregiver for his spouse and requires ability to transfer her Lives in: House/apartment Stairs:  4 steps to enter; 3 story home Has following equipment at home: Single point cane, Shower bench, and raised commode  PLOF: Independent; caregiver for wife; working part time as Ophthalmologist- requires sitting, standing, walking   PATIENT GOALS: improve R LE strength  OBJECTIVE:     TODAY'S TREATMENT: 11/23/23 Activity Comments  Bike L3.0 x 6 min LEs only Maintaining ~ 42 RPM  resisted walking in machine pulley: fwd/back 5x 20#  back/fwd 5x 20# Sidestepping 5x 10# each side  CGA, cueing for longer steps   90% spO2, 77bpm   farmer's carry with 15# 1 min with # in R hand, 30 sec in L hand Active waking rest break in between d/t fatigue and c/o muscle fatigue in thighs  walking march Pt reports difficulty with this; CGA provided           HOME  EXERCISE PROGRAM Last updated: 11/09/23 Access Code: [email or web address] URL: [email or web address] Date: 11/09/2023 Prepared by: [facility] - Outpatient  Rehab - [facility]  Exercises - Standing Hip Abduction with Counter Support  - 1 x daily - 5 x weekly - 2 sets - 10 reps - Side Stepping with Resistance at Ankles and Counter Support  - 1 x daily - 7 x weekly - 1 sets - 3-5 reps - Sidelying Hip Adduction  - 1 x daily - 5 x weekly - 2 sets - 10 reps - Hip Flexor/Ankle strengthening  - 1 x daily - 7 x weekly - 1 sets - 3 reps - 30 sec hold - Tall Kneeling Hip Hinge  - 1 x daily - 7 x weekly - Seated Long Arc Quad with Ankle Weight  - 1 x daily - 5 x weekly - 2 sets - 10 reps - Sit to Stand  - 1 x daily - 5 x weekly - 2 sets - 10 reps - Step Up  - 1 x daily - 7 x weekly - 3 sets - 10 reps - Tandem Walking with Counter Support  - 1 x daily - 5 x weekly - 2 sets - 5 reps - Walking with Eyes Closed and Counter Support  - 1 x daily - 5 x weekly - 2 sets - 5 reps       Note: Objective measures were completed at Evaluation unless otherwise noted.  DIAGNOSTIC FINDINGS: none recent  COGNITION: Overall cognitive status: Within functional limits for tasks assessed   SENSATION: Intact in B LEs to light touch   COORDINATION: Alternating pronation/supination: slightly slowed Alternating toe tap: B WNL Finger to nose: B WNL   POSTURE: rounded shoulders and forward head   *PALPATION: no TTP in B calf; slightly more soft tissue restriction on L  LOWER EXTREMITY ROM:     Active  Right Eval Left Eval  Hip flexion    Hip extension    Hip abduction    Hip adduction    Hip internal rotation    Hip external rotation    Knee flexion    Knee extension    Ankle dorsiflexion 14 *c/o pain in calf  10  Ankle plantarflexion    Ankle inversion    Ankle eversion     (Blank rows = not tested)  LOWER EXTREMITY MMT:    MMT Right Eval Left Eval  Hip flexion 4 4+  Hip extension     Hip abduction 4 4  Hip adduction 4 4  Hip internal rotation    Hip external rotation    Knee flexion 4 4+  Knee extension 4 4+  Ankle dorsiflexion 4+ 4+  Ankle plantarflexion 4+ 4+  Ankle inversion    Ankle eversion    (Blank rows = not tested)   GAIT: Gait pattern: Significant R-ward trunk lean with gait; slightly slowed gait speed  Assistive device utilized: None Level of assistance: Modified independence   FUNCTIONAL TESTS:     TODAY'S TREATMENT:  DATE:   PATIENT EDUCATION: Education details: prognosis, POC, HEP, answered pt's questions on leg press machine, edu on aquatic therapy as pt requests this Person educated: Patient Education method: Explanation, Demonstration, Tactile cues, Verbal cues, and Handouts Education comprehension: verbalized understanding and returned demonstration  HOME EXERCISE PROGRAM: Access Code: [email or web address] URL: [email or web address] Date: 08/13/2023 Prepared by: [facility] - Outpatient  Rehab - [facility]  Exercises - Standing Gastroc Stretch at Counter  - 1 x daily - 5 x weekly - 2 sets - 30 sec hold - Seated Hamstring Stretch  - 1 x daily - 5 x weekly - 2 sets - 30 sec hold - Standing Hip Abduction with Counter Support  - 1 x daily - 5 x weekly - 2 sets - 10 reps - Standing Hip Extension with Counter Support  - 1 x daily - 5 x weekly - 2 sets - 10 reps - Mini Squat with Counter Support  - 1 x daily - 5 x weekly - 2 sets - 10 reps  GOALS: Goals reviewed with patient? Yes   SHORT TERM GOALS: Target date: 10/26/2023  Patient to be independent with initial HEP. Baseline: HEP initiated Goal status: MET12/07/2023  Pt will perform Condition 4 on MCTSIB for 30 seconds moderate or less sway Baseline:  Severe sway 20 sec; mild-mod sway 30 sec 11/09/23 Goal status:  MET 11/09/23     LONG TERM  GOALS: Target date: 12/21/23  Patient to be independent with advanced HEP. Baseline: Not yet initiated; reports "not doing all the things, but some of them about 1x/day" Goal status: IN PROGRESS 11/09/23  Patient to demonstrate B LE strength >/=4+/5.  Baseline: See above; improved but not yet met  11/09/23 Goal status: IN PROGRESS  11/09/23  Patient to demonstrate L ankle dorsiflexion AROM to at least 14 deg in order to improve efficiency of gait.   Baseline: 10 deg>15 degrees Goal status: MET 09/28/2023  Patient to demonstrate safe stair navigation with alternating reciprocal pattern and no UE support.  Baseline: alternating reciprocal pattern with 1-2 UE support required ; continues to need UE support and alt pattern; able to perform without UE support but with limited eccentric control  11/09/23 Goal status: IN PROGRESS  11/09/23  Patient to score at least 20/24 on DGI in order to decrease risk of falls.  Baseline: 17>21/24 Goal status: MET1/23/2025  Patient to demonstrate 25% improvement in R-ward trunk lean with gait.  Baseline: significant R lean>slight improvement per report 09/28/2023; unchanged  11/09/23 Goal status: IN PROGRESS  11/09/23  Pt will improve FGA score to 22/30 for decreased fall risk. Baseline:  19/30; 20/30  11/09/23 Goal status:  IN PROGRESS  11/09/23  Pt will report ability to push wife's wheelchair up and down ramp into home. Baseline:  Currently not able to do; nurse has to help; wife in hospital currently  11/09/23 Goal Status: DEFERRED  11/09/23   ASSESSMENT:  CLINICAL IMPRESSION: Patient arrived to session without new complaints. Performed resisted walking for balance and LE muscle control with cues for longer steps and max control. Patient took good care to correct according to cues. Additional walking challenges provided with weight and dynamic challenges- patient did fatigue with these exercises, thus performed active rest breaks in between. No complaints at  end of session.    OBJECTIVE IMPAIRMENTS: Abnormal gait, decreased balance, difficulty walking, decreased ROM, decreased strength, impaired flexibility, impaired sensation, postural dysfunction, and pain.   ACTIVITY LIMITATIONS: carrying, lifting, bending, standing, squatting, stairs, transfers,  bathing, toileting, dressing, locomotion level, and caring for others  PARTICIPATION LIMITATIONS: meal prep, cleaning, laundry, shopping, community activity, occupation, yard work, and church  PERSONAL FACTORS: Age, Past/current experiences, Time since onset of injury/illness/exacerbation, and 3+ comorbidities: HFrEF, atrial fibrillation, VF cardiac arrest s/p ICD, CAD, severe aortic regurgitation s/p AVR 10/2018 and aortic root replacement with CABGx1, aortic dilation, hypertension, hypothyroidism, type 2 diabetes, multiple myeloma, CKD IIIb-IV, migraine  are also affecting patient's functional outcome.   REHAB POTENTIAL: Good  CLINICAL DECISION MAKING: Evolving/moderate complexity  EVALUATION COMPLEXITY: Moderate  PLAN:  PT FREQUENCY: 1x/week  PT DURATION: 6 weeks  PLANNED INTERVENTIONS: 97164- PT Re-evaluation, 97110-Therapeutic exercises, 97530- Therapeutic activity, 97112- Neuromuscular re-education, 97535- Self Care, 14782- Manual therapy, [phone]- Gait training, [phone]- Canalith repositioning, [phone]- Aquatic Therapy, Patient/Family education, Balance training, Stair training, Taping, Dry Needling, Joint mobilization, Spinal mobilization, Vestibular training, DME instructions, Cryotherapy, and Moist heat   PLAN FOR NEXT SESSION:  Warm up with recumbent bike for RLE flexibility; progress Bilat hip and R knee strength, stairs-step ups, step downs, step taps (consider adding to HEP), balance challenges on level/unlevel surfaces   Baldemar Friday, PT, DPT 11/23/23 10:16 AM  [facility] at [facility] [address], [address] [place], [place] [address] Phone  # [phone] Fax # [phone]

## 2023-11-23 ENCOUNTER — Encounter: Payer: Self-pay | Admitting: Physical Therapy

## 2023-11-23 ENCOUNTER — Ambulatory Visit: Payer: Medicare Other | Admitting: Physical Therapy

## 2023-11-23 DIAGNOSIS — R2689 Other abnormalities of gait and mobility: Secondary | ICD-10-CM | POA: Diagnosis not present

## 2023-11-23 DIAGNOSIS — M6281 Muscle weakness (generalized): Secondary | ICD-10-CM

## 2023-11-23 DIAGNOSIS — R2681 Unsteadiness on feet: Secondary | ICD-10-CM

## 2023-11-23 NOTE — Therapy (Signed)
 OUTPATIENT PHYSICAL THERAPY TREATMENT NOTE   Patient Name: Eric [name]., MD MRN: [id number] DOB:04-Jun-1951, 73 y.o., male Today's Date: 11/26/2023   PCP:    Dorothyann Peng, MD   REFERRING PROVIDER: Chilton Si, MD     END OF SESSION:  PT End of Session - 11/26/23 1340     Visit Number 22    Number of Visits 25    Date for PT Re-Evaluation 12/21/23    Authorization Type Medicare/AARP    Progress Note Due on Visit --   PN was done on Visit 19   PT Start Time 1313    PT Stop Time 1353    PT Time Calculation (min) 40 min    Equipment Utilized During Treatment Gait belt    Activity Tolerance Patient tolerated treatment well    Behavior During Therapy WFL for tasks assessed/performed                    Past Medical History:  Diagnosis Date   Acute blood loss anemia    Acute encephalopathy    Acute hypoxemic respiratory failure (HCC)    Acute idiopathic gout of right ankle    Acute lumbar back pain    Acute on chronic systolic heart failure (HCC)    Acute respiratory failure (HCC)    AKI (acute kidney injury) (HCC)    Altered mental status    Aortic aneurysm without rupture (HCC) 02/09/2016   Aortic root enlargement (HCC) 02/13/2012   Aortic valve regurgitation, acquired 02/13/2012   Arrhythmia    Atrial flutter (HCC) 03/18/2012   Back pain    Cardiac arrest (HCC) 02/01/2016   CHF (congestive heart failure) (HCC)    Chronic anticoagulation 03/04/2013   Chronic combined systolic and diastolic heart failure (HCC)    Chronic kidney disease    kidney fx studies increased    Chronic lower back pain    Chronic renal insufficiency    Chronic systolic heart failure (HCC) 02/13/2012   Recent diagnosis 4 / 2013, LVEF 25% by Echo 12/2011  03/2013: Echo at [facility] Cardiology Conclusions: 1. Left ventricular ejection fraction estimated by 2D at 40-45 percent. 2. Mild concentric left ventricular hypertrophy. 3. Mild left atrial enlargement. 4. Moderate aortic  valve regurgitation. 5. The aortic root at the sinus(es) of valsalva is moderately dilated 6. Mild mitral valve regurgitation. 7.   CKD (chronic kidney disease)    CKD (chronic kidney disease), stage IV (HCC) 08/06/2013   Creatinine 2.4 on 07/04/13    Claustrophobia    Colon cancer screening 03/04/2013   Debility 02/22/2016   Diabetes mellitus type 2 in nonobese [facility])    Diabetes mellitus type 2 in obese    Dysrhythmia    "palpitations"   Encounter for central line placement    Exertional dyspnea 01/2012   Femoral nerve injury 02/22/2016   Femoral neuropathy    Goals of care, counseling/discussion 05/05/2020   HCAP (healthcare-associated pneumonia)    Heart murmur    Hyperlipidemia 02/13/2012   Hypertension    Hypothyroidism    Internal hemorrhoids without mention of complication 04/11/2013   Labile blood pressure    Left bundle branch block 02/13/2012   Leg weakness, bilateral    Long term (current) use of anticoagulants 08/06/2013   Eliquis therapy    Long term current use of amiodarone 08/07/2016   Lower extremity weakness    Migraine 02/13/2012   "opthalmic"   Multiple myeloma (HCC) 05/05/2020   Non-traumatic rhabdomyolysis  Obesity (BMI 30-39.9) 02/13/2012   Pain    Paroxysmal atrial fibrillation (HCC)    Pneumonia    Retroperitoneal bleed    Right ankle pain    Right knee pain    Severe aortic regurgitation 02/09/2016   Special screening for malignant neoplasms, colon 04/11/2013   Thyroid activity decreased    Tibial pain    Varicose vein of leg    right   Ventricular fibrillation (HCC) 02/09/2016   Weakness of both lower extremities    Past Surgical History:  Procedure Laterality Date   AORTIC VALVE REPLACEMENT  11/20/2018   CARDIAC CATHETERIZATION N/A 02/17/2016   Procedure: Left Heart Cath and Coronary Angiography;  Surgeon: Corky Crafts, MD;  Location: [facility] INVASIVE CV LAB;  Service: Cardiovascular;  Laterality: N/A;   CARDIOVERSION  03/22/2012    Procedure: CARDIOVERSION;  Surgeon: Donato Schultz, MD;  Location: [facility] ENDOSCOPY;  Service: Cardiovascular;  Laterality: N/A;   CARDIOVERSION  04/19/2012   Procedure: CARDIOVERSION;  Surgeon: Lesleigh Noe, MD;  Location: [facility] OR;  Service: Cardiovascular;  Laterality: N/A;   COLONOSCOPY N/A 04/11/2013   Procedure: COLONOSCOPY;  Surgeon: Louis Meckel, MD;  Location: [facility] ENDOSCOPY;  Service: Endoscopy;  Laterality: N/A;   COLONOSCOPY N/A 04/11/2013   Procedure: COLONOSCOPY;  Surgeon: Louis Meckel, MD;  Location: [facility] ENDOSCOPY;  Service: Endoscopy;  Laterality: N/A;   EP IMPLANTABLE DEVICE N/A 02/17/2016   Procedure: BiV ICD Insertion CRT-D;  Surgeon: Marinus Maw, MD;  Location: [facility] INVASIVE CV LAB;  Service: Cardiovascular;  Laterality: N/A;   FINGER SURGERY  2012   "4th digit right hand; thumb on left hand"   RADIOLOGY WITH ANESTHESIA N/A 02/11/2016   Procedure: MRI OF THE BRAIN WITHOUT CONTRAST, LUMBAR WITHOUT CONTRAST;  Surgeon: Medication Radiologist, MD;  Location: [facility] OR;  Service: Radiology;  Laterality: N/A;  DR. WOOD/MRI   RIGHT/LEFT HEART CATH AND CORONARY ANGIOGRAPHY N/A 07/26/2018   Procedure: RIGHT/LEFT HEART CATH AND CORONARY ANGIOGRAPHY;  Surgeon: Lyn Records, MD;  Location: [facility] INVASIVE CV LAB;  Service: Cardiovascular;  Laterality: N/A;   Skin melanocytoma excision  2012   "above left clavicle"   STERNAL INCISION RECLOSURE  11/2018   STERNAL WIRE REMOVAL  11/2018   STERNAL WOUND DEBRIDEMENT  11/2018   TEE WITHOUT CARDIOVERSION  03/22/2012   Procedure: TRANSESOPHAGEAL ECHOCARDIOGRAM (TEE);  Surgeon: Donato Schultz, MD;  Location: [facility] ENDOSCOPY;  Service: Cardiovascular;  Laterality: N/A;   TEE WITHOUT CARDIOVERSION N/A 02/08/2016   Procedure: TRANSESOPHAGEAL ECHOCARDIOGRAM (TEE);  Surgeon: Lewayne Bunting, MD;  Location: [facility] ENDOSCOPY;  Service: Cardiovascular;  Laterality: N/A;   Patient Active Problem List   Diagnosis Date Noted   Enlarged prostate 09/10/2023   Dyslipidemia due  to type 2 diabetes mellitus (HCC) 09/10/2023   Acquired thrombophilia (HCC) 05/14/2023   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   Goals of care, counseling/discussion 05/05/2020   Multiple myeloma (HCC) 05/05/2020   S/P flap graft 01/07/2019   Coronary artery disease 01/07/2019   Long term (current) use of antibiotics 01/03/2019   Essential hypertension 12/27/2018   Postoperative infection of wound of sternum 12/27/2018   Decreased strength, endurance, and mobility 12/27/2018   Cardiac LV ejection fraction 30-35% 12/27/2018   Decreased activities of daily living (ADL) 12/27/2018   Klebsiella infection 12/27/2018   S/P AVR (aortic valve replacement) 11/23/2018   S/P ascending aortic aneurysm repair 11/23/2018   NICM (nonischemic cardiomyopathy) (HCC) 11/19/2018   AICD (automatic cardioverter/defibrillator) present 11/19/2018  Other symptoms and signs involving the musculoskeletal system 05/08/2018   Long term current use of amiodarone 08/07/2016   Type 2 diabetes mellitus with stage 3b chronic kidney disease, without long-term current use of insulin (HCC)    Femoral neuropathy    Leg weakness, bilateral    Paroxysmal atrial fibrillation (HCC)    Ventricular fibrillation (HCC) 02/09/2016   Cardiac arrest (HCC) 02/01/2016   Thyroid activity decreased    Arrhythmia    CKD (chronic kidney disease), stage IV (HCC) 08/06/2013    Class: Chronic   Long term current use of anticoagulant 03/04/2013   Atrial flutter (HCC) 03/18/2012    Class: Acute   Chronic systolic heart failure (HCC) 02/13/2012    Class: Acute   Hypertension, accelerated 02/13/2012   Hyperlipidemia 02/13/2012   Left bundle branch block 02/13/2012   Obesity (BMI 30-39.9) 02/13/2012    ONSET DATE: "quite some time" reports years   REFERRING DIAG:  G62.9 (ICD-10-CM) - Neuropathy Reprot THERAPY DIAG:  Unsteadiness on feet  Muscle weakness (generalized)  Other abnormalities of gait and  mobility  Rationale for Evaluation and Treatment: Rehabilitation  SUBJECTIVE:                                                                                                                                                                                             SUBJECTIVE STATEMENT: Reports that he was a little sore the day after last session. Reports R thigh/knee feels tight.    Pt accompanied by: self  PERTINENT HISTORY: HFrEF, atrial fibrillation, VF cardiac arrest s/p ICD, CAD, severe aortic regurgitation s/p AVR 10/2018 and aortic root replacement with CABGx1, aortic dilation, hypertension, hypothyroidism, type 2 diabetes, multiple myeloma, CKD IIIb-IV, migraine  PAIN:  Are you having pain? Yes: NPRS scale: 0/10 Pain location: R knee Pain description: tight, stiff, numb  Aggravating factors: reports this is chronic; worse in AM Relieving factors: exercises in bed   PRECAUTIONS: Fall and ICD/Pacemaker  RED FLAGS: None   WEIGHT BEARING RESTRICTIONS: No  FALLS: Has patient fallen in last 6 months? No  LIVING ENVIRONMENT: Lives with: lives with their spouse; caregiver for his spouse and requires ability to transfer her Lives in: House/apartment Stairs:  4 steps to enter; 3 story home Has following equipment at home: Single point cane, Shower bench, and raised commode  PLOF: Independent; caregiver for wife; working part time as Ophthalmologist- requires sitting, standing, walking   PATIENT GOALS: improve R LE strength  OBJECTIVE:     TODAY'S TREATMENT: 11/26/23 Activity Comments  Bike L3.0 x 6 min UEs/LEs Maintaining ~45RPM   walking high knee march 4x60  ft  5# in each hand; better balance today   farmer's carry with 15# each hand with 1 min active walking rest break in between   95% spO2, 110 bpm  after activity  8" alt step ups 2x5 each LE 1 UE support ; improved control both ascending and descending  90% spO2, 99bpm   STS + alt side step 3x5 Pushing off  knees; good stability; last set side stepping over dumbbell on floor  93% spO2, 94 bpm      PATIENT EDUCATION: Education details: reviewed HEP and discussed pt's progress  Person educated: Patient Education method: Explanation Education comprehension: verbalized understanding   HOME EXERCISE PROGRAM Last updated: 11/09/23 Access Code: [email or web address] URL: [email or web address] Date: 11/09/2023 Prepared by: [facility] - Outpatient  Rehab - [facility]  Exercises - Standing Hip Abduction with Counter Support  - 1 x daily - 5 x weekly - 2 sets - 10 reps - Side Stepping with Resistance at Ankles and Counter Support  - 1 x daily - 7 x weekly - 1 sets - 3-5 reps - Sidelying Hip Adduction  - 1 x daily - 5 x weekly - 2 sets - 10 reps - Hip Flexor/Ankle strengthening  - 1 x daily - 7 x weekly - 1 sets - 3 reps - 30 sec hold - Tall Kneeling Hip Hinge  - 1 x daily - 7 x weekly - Seated Long Arc Quad with Ankle Weight  - 1 x daily - 5 x weekly - 2 sets - 10 reps - Sit to Stand  - 1 x daily - 5 x weekly - 2 sets - 10 reps - Step Up  - 1 x daily - 7 x weekly - 3 sets - 10 reps - Tandem Walking with Counter Support  - 1 x daily - 5 x weekly - 2 sets - 5 reps - Walking with Eyes Closed and Counter Support  - 1 x daily - 5 x weekly - 2 sets - 5 reps       Note: Objective measures were completed at Evaluation unless otherwise noted.  DIAGNOSTIC FINDINGS: none recent  COGNITION: Overall cognitive status: Within functional limits for tasks assessed   SENSATION: Intact in B LEs to light touch   COORDINATION: Alternating pronation/supination: slightly slowed Alternating toe tap: B WNL Finger to nose: B WNL   POSTURE: rounded shoulders and forward head   *PALPATION: no TTP in B calf; slightly more soft tissue restriction on L  LOWER EXTREMITY ROM:     Active  Right Eval Left Eval  Hip flexion    Hip extension    Hip abduction    Hip adduction    Hip internal  rotation    Hip external rotation    Knee flexion    Knee extension    Ankle dorsiflexion 14 *c/o pain in calf  10  Ankle plantarflexion    Ankle inversion    Ankle eversion     (Blank rows = not tested)  LOWER EXTREMITY MMT:    MMT Right Eval Left Eval  Hip flexion 4 4+  Hip extension    Hip abduction 4 4  Hip adduction 4 4  Hip internal rotation    Hip external rotation    Knee flexion 4 4+  Knee extension 4 4+  Ankle dorsiflexion 4+ 4+  Ankle plantarflexion 4+ 4+  Ankle inversion    Ankle eversion    (Blank rows = not tested)   GAIT:  Gait pattern: Significant R-ward trunk lean with gait; slightly slowed gait speed  Assistive device utilized: None Level of assistance: Modified independence   FUNCTIONAL TESTS:     TODAY'S TREATMENT:                                                                                                                              DATE:   PATIENT EDUCATION: Education details: prognosis, POC, HEP, answered pt's questions on leg press machine, edu on aquatic therapy as pt requests this Person educated: Patient Education method: Explanation, Demonstration, Tactile cues, Verbal cues, and Handouts Education comprehension: verbalized understanding and returned demonstration  HOME EXERCISE PROGRAM: Access Code: [email or web address] URL: [email or web address] Date: 08/13/2023 Prepared by: [facility] - Outpatient  Rehab - [facility]  Exercises - Standing Gastroc Stretch at Counter  - 1 x daily - 5 x weekly - 2 sets - 30 sec hold - Seated Hamstring Stretch  - 1 x daily - 5 x weekly - 2 sets - 30 sec hold - Standing Hip Abduction with Counter Support  - 1 x daily - 5 x weekly - 2 sets - 10 reps - Standing Hip Extension with Counter Support  - 1 x daily - 5 x weekly - 2 sets - 10 reps - Mini Squat with Counter Support  - 1 x daily - 5 x weekly - 2 sets - 10 reps  GOALS: Goals reviewed with patient? Yes   SHORT TERM GOALS: Target  date: 10/26/2023  Patient to be independent with initial HEP. Baseline: HEP initiated Goal status: MET12/07/2023  Pt will perform Condition 4 on MCTSIB for 30 seconds moderate or less sway Baseline:  Severe sway 20 sec; mild-mod sway 30 sec 11/09/23 Goal status:  MET 11/09/23     LONG TERM GOALS: Target date: 12/21/23  Patient to be independent with advanced HEP. Baseline: Not yet initiated; reports "not doing all the things, but some of them about 1x/day" Goal status: IN PROGRESS 11/09/23  Patient to demonstrate B LE strength >/=4+/5.  Baseline: See above; improved but not yet met  11/09/23 Goal status: IN PROGRESS  11/09/23  Patient to demonstrate L ankle dorsiflexion AROM to at least 14 deg in order to improve efficiency of gait.   Baseline: 10 deg>15 degrees Goal status: MET 09/28/2023  Patient to demonstrate safe stair navigation with alternating reciprocal pattern and no UE support.  Baseline: alternating reciprocal pattern with 1-2 UE support required ; continues to need UE support and alt pattern; able to perform without UE support but with limited eccentric control  11/09/23 Goal status: IN PROGRESS  11/09/23  Patient to score at least 20/24 on DGI in order to decrease risk of falls.  Baseline: 17>21/24 Goal status: MET1/23/2025  Patient to demonstrate 25% improvement in R-ward trunk lean with gait.  Baseline: significant R lean>slight improvement per report 09/28/2023; unchanged  11/09/23 Goal status: IN PROGRESS  11/09/23  Pt will improve FGA score to  22/30 for decreased fall risk. Baseline:  19/30; 20/30  11/09/23 Goal status:  IN PROGRESS  11/09/23  Pt will report ability to push wife's wheelchair up and down ramp into home. Baseline:  Currently not able to do; nurse has to help; wife in hospital currently  11/09/23 Goal Status: DEFERRED  11/09/23   ASSESSMENT:  CLINICAL IMPRESSION: Patient arrived to session with report of some muscle soreness after last session.  Continued working on dynamic balance challenges with weighted resistance. Patient appears steadier with walking march today and was able to tolerate addition of small weights. Also able to perform longer duration of farmer's carry today. Step ups are revealing improved control ascending and descending steps today. Patient tolerated session well and without complaints upon leaving.    OBJECTIVE IMPAIRMENTS: Abnormal gait, decreased balance, difficulty walking, decreased ROM, decreased strength, impaired flexibility, impaired sensation, postural dysfunction, and pain.   ACTIVITY LIMITATIONS: carrying, lifting, bending, standing, squatting, stairs, transfers, bathing, toileting, dressing, locomotion level, and caring for others  PARTICIPATION LIMITATIONS: meal prep, cleaning, laundry, shopping, community activity, occupation, yard work, and church  PERSONAL FACTORS: Age, Past/current experiences, Time since onset of injury/illness/exacerbation, and 3+ comorbidities: HFrEF, atrial fibrillation, VF cardiac arrest s/p ICD, CAD, severe aortic regurgitation s/p AVR 10/2018 and aortic root replacement with CABGx1, aortic dilation, hypertension, hypothyroidism, type 2 diabetes, multiple myeloma, CKD IIIb-IV, migraine  are also affecting patient's functional outcome.   REHAB POTENTIAL: Good  CLINICAL DECISION MAKING: Evolving/moderate complexity  EVALUATION COMPLEXITY: Moderate  PLAN:  PT FREQUENCY: 1x/week  PT DURATION: 6 weeks  PLANNED INTERVENTIONS: 97164- PT Re-evaluation, 97110-Therapeutic exercises, 97530- Therapeutic activity, 97112- Neuromuscular re-education, 97535- Self Care, 16109- Manual therapy, [phone]- Gait training, [phone]- Canalith repositioning, [phone]- Aquatic Therapy, Patient/Family education, Balance training, Stair training, Taping, Dry Needling, Joint mobilization, Spinal mobilization, Vestibular training, DME instructions, Cryotherapy, and Moist heat   PLAN FOR NEXT SESSION:  Warm up  with recumbent bike for RLE flexibility; progress Bilat hip and R knee strength, stairs-step ups, step downs, step taps (consider adding to HEP), balance challenges on level/unlevel surfaces    Baldemar Friday, PT, DPT 11/26/23 1:58 PM  [facility] at [facility] [address] [place], [place] [address] Phone # [phone] Fax # [phone]

## 2023-11-26 ENCOUNTER — Encounter: Payer: Self-pay | Admitting: Physical Therapy

## 2023-11-26 ENCOUNTER — Ambulatory Visit: Payer: Medicare Other | Attending: Cardiovascular Disease | Admitting: Physical Therapy

## 2023-11-26 DIAGNOSIS — M6281 Muscle weakness (generalized): Secondary | ICD-10-CM | POA: Diagnosis not present

## 2023-11-26 DIAGNOSIS — R2681 Unsteadiness on feet: Secondary | ICD-10-CM | POA: Diagnosis not present

## 2023-11-26 DIAGNOSIS — R2689 Other abnormalities of gait and mobility: Secondary | ICD-10-CM | POA: Insufficient documentation

## 2023-12-05 NOTE — Therapy (Signed)
 OUTPATIENT PHYSICAL THERAPY TREATMENT NOTE   Patient Name: Eric Bergdoll., MD MRN: [id number] DOB:[birth year], 73 y.o., male Today's Date: 12/07/2023   PCP:    Dorothyann Peng, MD   REFERRING PROVIDER: Chilton Si, MD     END OF SESSION:  PT End of Session - 12/07/23 1017     Visit Number 23    Number of Visits 25    Date for PT Re-Evaluation 12/21/23    Authorization Type Medicare/[employer]    Progress Note Due on Visit --   PN was done on Visit 19   PT Start Time [phone]   pt late   PT Stop Time 1017    PT Time Calculation (min) 35 min    Activity Tolerance Patient tolerated treatment well    Behavior During Therapy [facility] for tasks assessed/performed                     Past Medical History:  Diagnosis Date   Acute blood loss anemia    Acute encephalopathy    Acute hypoxemic respiratory failure (HCC)    Acute idiopathic gout of right ankle    Acute lumbar back pain    Acute on chronic systolic heart failure (HCC)    Acute respiratory failure (HCC)    AKI (acute kidney injury) (HCC)    Altered mental status    Aortic aneurysm without rupture (HCC) 02/09/2016   Aortic root enlargement (HCC) 02/13/2012   Aortic valve regurgitation, acquired 02/13/2012   Arrhythmia    Atrial flutter (HCC) 03/18/2012   Back pain    Cardiac arrest (HCC) 02/01/2016   CHF (congestive heart failure) (HCC)    Chronic anticoagulation 03/04/2013   Chronic combined systolic and diastolic heart failure (HCC)    Chronic kidney disease    kidney fx studies increased    Chronic lower back pain    Chronic renal insufficiency    Chronic systolic heart failure (HCC) 02/13/2012   Recent diagnosis 4 / 2013, LVEF 25% by Echo 12/2011  03/2013: Echo at [facility] Cardiology Conclusions: 1. Left ventricular ejection fraction estimated by 2D at 40-45 percent. 2. Mild concentric left ventricular hypertrophy. 3. Mild left atrial enlargement. 4. Moderate aortic valve regurgitation. 5. The aortic root  at the sinus(es) of valsalva is moderately dilated 6. Mild mitral valve regurgitation. 7.   CKD (chronic kidney disease)    CKD (chronic kidney disease), stage IV (HCC) 08/06/2013   Creatinine 2.4 on 07/04/13    Claustrophobia    Colon cancer screening 03/04/2013   Debility 02/22/2016   Diabetes mellitus type 2 in nonobese [facility])    Diabetes mellitus type 2 in obese    Dysrhythmia    "palpitations"   Encounter for central line placement    Exertional dyspnea 01/2012   Femoral nerve injury 02/22/2016   Femoral neuropathy    Goals of care, counseling/discussion 05/05/2020   HCAP (healthcare-associated pneumonia)    Heart murmur    Hyperlipidemia 02/13/2012   Hypertension    Hypothyroidism    Internal hemorrhoids without mention of complication 04/11/2013   Labile blood pressure    Left bundle branch block 02/13/2012   Leg weakness, bilateral    Long term (current) use of anticoagulants 08/06/2013   Eliquis therapy    Long term current use of amiodarone 08/07/2016   Lower extremity weakness    Migraine 02/13/2012   "opthalmic"   Multiple myeloma (HCC) 05/05/2020   Non-traumatic rhabdomyolysis    Obesity (BMI 30-39.9) 02/13/2012  Pain    Paroxysmal atrial fibrillation (HCC)    Pneumonia    Retroperitoneal bleed    Right ankle pain    Right knee pain    Severe aortic regurgitation 02/09/2016   Special screening for malignant neoplasms, colon 04/11/2013   Thyroid activity decreased    Tibial pain    Varicose vein of leg    right   Ventricular fibrillation (HCC) 02/09/2016   Weakness of both lower extremities    Past Surgical History:  Procedure Laterality Date   AORTIC VALVE REPLACEMENT  11/20/2018   CARDIAC CATHETERIZATION N/A 02/17/2016   Procedure: Left Heart Cath and Coronary Angiography;  Surgeon: Corky Crafts, MD;  Location: [facility] INVASIVE CV LAB;  Service: Cardiovascular;  Laterality: N/A;   CARDIOVERSION  03/22/2012   Procedure: CARDIOVERSION;  Surgeon: Donato Schultz, MD;  Location: [facility] ENDOSCOPY;  Service: Cardiovascular;  Laterality: N/A;   CARDIOVERSION  04/19/2012   Procedure: CARDIOVERSION;  Surgeon: Lesleigh Noe, MD;  Location: [facility] OR;  Service: Cardiovascular;  Laterality: N/A;   COLONOSCOPY N/A 04/11/2013   Procedure: COLONOSCOPY;  Surgeon: Louis Meckel, MD;  Location: [facility] ENDOSCOPY;  Service: Endoscopy;  Laterality: N/A;   COLONOSCOPY N/A 04/11/2013   Procedure: COLONOSCOPY;  Surgeon: Louis Meckel, MD;  Location: [facility] ENDOSCOPY;  Service: Endoscopy;  Laterality: N/A;   EP IMPLANTABLE DEVICE N/A 02/17/2016   Procedure: BiV ICD Insertion CRT-D;  Surgeon: Marinus Maw, MD;  Location: [facility] INVASIVE CV LAB;  Service: Cardiovascular;  Laterality: N/A;   FINGER SURGERY  2012   "4th digit right hand; thumb on left hand"   RADIOLOGY WITH ANESTHESIA N/A 02/11/2016   Procedure: MRI OF THE BRAIN WITHOUT CONTRAST, LUMBAR WITHOUT CONTRAST;  Surgeon: Medication Radiologist, MD;  Location: [facility] OR;  Service: Radiology;  Laterality: N/A;  DR. WOOD/MRI   RIGHT/LEFT HEART CATH AND CORONARY ANGIOGRAPHY N/A 07/26/2018   Procedure: RIGHT/LEFT HEART CATH AND CORONARY ANGIOGRAPHY;  Surgeon: Lyn Records, MD;  Location: [facility] INVASIVE CV LAB;  Service: Cardiovascular;  Laterality: N/A;   Skin melanocytoma excision  2012   "above left clavicle"   STERNAL INCISION RECLOSURE  11/2018   STERNAL WIRE REMOVAL  11/2018   STERNAL WOUND DEBRIDEMENT  11/2018   TEE WITHOUT CARDIOVERSION  03/22/2012   Procedure: TRANSESOPHAGEAL ECHOCARDIOGRAM (TEE);  Surgeon: Donato Schultz, MD;  Location: [facility] ENDOSCOPY;  Service: Cardiovascular;  Laterality: N/A;   TEE WITHOUT CARDIOVERSION N/A 02/08/2016   Procedure: TRANSESOPHAGEAL ECHOCARDIOGRAM (TEE);  Surgeon: Lewayne Bunting, MD;  Location: [facility] ENDOSCOPY;  Service: Cardiovascular;  Laterality: N/A;   Patient Active Problem List   Diagnosis Date Noted   Enlarged prostate 09/10/2023   Dyslipidemia due to type 2 diabetes mellitus (HCC)  09/10/2023   Acquired thrombophilia (HCC) 05/14/2023   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   Goals of care, counseling/discussion 05/05/2020   Multiple myeloma (HCC) 05/05/2020   S/P flap graft 01/07/2019   Coronary artery disease 01/07/2019   Long term (current) use of antibiotics 01/03/2019   Essential hypertension 12/27/2018   Postoperative infection of wound of sternum 12/27/2018   Decreased strength, endurance, and mobility 12/27/2018   Cardiac LV ejection fraction 30-35% 12/27/2018   Decreased activities of daily living (ADL) 12/27/2018   Klebsiella infection 12/27/2018   S/P AVR (aortic valve replacement) 11/23/2018   S/P ascending aortic aneurysm repair 11/23/2018   NICM (nonischemic cardiomyopathy) (HCC) 11/19/2018   AICD (automatic cardioverter/defibrillator) present 11/19/2018   Other symptoms and signs involving the  musculoskeletal system 05/08/2018   Long term current use of amiodarone 08/07/2016   Type 2 diabetes mellitus with stage 3b chronic kidney disease, without long-term current use of insulin (HCC)    Femoral neuropathy    Leg weakness, bilateral    Paroxysmal atrial fibrillation (HCC)    Ventricular fibrillation (HCC) 02/09/2016   Cardiac arrest (HCC) 02/01/2016   Thyroid activity decreased    Arrhythmia    CKD (chronic kidney disease), stage IV (HCC) 08/06/2013    Class: Chronic   Long term current use of anticoagulant 03/04/2013   Atrial flutter (HCC) 03/18/2012    Class: Acute   Chronic systolic heart failure (HCC) 02/13/2012    Class: Acute   Hypertension, accelerated 02/13/2012   Hyperlipidemia 02/13/2012   Left bundle branch block 02/13/2012   Obesity (BMI 30-39.9) 02/13/2012    ONSET DATE: "quite some time" reports years   REFERRING DIAG:  G62.9 (ICD-10-CM) - Neuropathy Reprot THERAPY DIAG:  Unsteadiness on feet  Muscle weakness (generalized)  Other abnormalities of gait and mobility  Rationale for Evaluation and Treatment:  Rehabilitation  SUBJECTIVE:                                                                                                                                                                                             SUBJECTIVE STATEMENT: Reports that he was not able to exercise much d/t his wife passing away last week.    Pt accompanied by: self  PERTINENT HISTORY: HFrEF, atrial fibrillation, VF cardiac arrest s/p ICD, CAD, severe aortic regurgitation s/p AVR 10/2018 and aortic root replacement with CABGx1, aortic dilation, hypertension, hypothyroidism, type 2 diabetes, multiple myeloma, CKD IIIb-IV, migraine  PAIN:  Are you having pain? Yes: NPRS scale: 0/10 Pain location: R knee Pain description: tight, stiff, numb  Aggravating factors: reports this is chronic; worse in AM Relieving factors: exercises in bed   PRECAUTIONS: Fall and ICD/Pacemaker  RED FLAGS: None   WEIGHT BEARING RESTRICTIONS: No  FALLS: Has patient fallen in last 6 months? No  LIVING ENVIRONMENT: Lives with: lives with their spouse; caregiver for his spouse and requires ability to transfer her Lives in: House/apartment Stairs:  4 steps to enter; 3 story home Has following equipment at home: Single point cane, Shower bench, and raised commode  PLOF: Independent; caregiver for wife; working part time as Ophthalmologist- requires sitting, standing, walking   PATIENT GOALS: improve R LE strength  OBJECTIVE:    TODAY'S TREATMENT: 12/07/23 Activity Comments  Bike L3.0 x 6 min UEs/LEs  Maintaining 50 RPM  wall squats with pball 2x10 Cueing for form and alignment to reduce  discomfort through knees  R mod thomas stretch with strap 2x30" Assisted with alignment   sitting HS curl with pulley 2x10 15# L, 10# L,  Cueing for slow eccentric control             HOME EXERCISE PROGRAM Access Code: [email or web address] URL: [email or web address] Date: 12/07/2023 Prepared by: [facility] - Outpatient  Rehab - [facility]  Exercises - Standing Hip Abduction with Counter Support  - 1 x daily - 5 x weekly - 2 sets - 10 reps - Side Stepping with Resistance at Ankles and Counter Support  - 1 x daily - 7 x weekly - 1 sets - 3-5 reps - Sidelying Hip Adduction  - 1 x daily - 5 x weekly - 2 sets - 10 reps - Hip Flexor/Ankle strengthening  - 1 x daily - 7 x weekly - 1 sets - 3 reps - 30 sec hold - Tall Kneeling Hip Hinge  - 1 x daily - 7 x weekly - Seated Long Arc Quad with Ankle Weight  - 1 x daily - 5 x weekly - 2 sets - 10 reps - Sit to Stand  - 1 x daily - 5 x weekly - 2 sets - 10 reps - Step Up  - 1 x daily - 7 x weekly - 3 sets - 10 reps - Tandem Walking with Counter Support  - 1 x daily - 5 x weekly - 2 sets - 5 reps - Walking with Eyes Closed and Counter Support  - 1 x daily - 5 x weekly - 2 sets - 5 reps - Modified Thomas Stretch  - 1 x daily - 5 x weekly - 2 sets - 30 sec hold    PATIENT EDUCATION: Education details: HEP update, discussed [employer] or [employer] exercise options to maintain fitness after DC Person educated: Patient Education method: Explanation, Demonstration, Tactile cues, Verbal cues, and Handouts Education comprehension: verbalized understanding and returned demonstration      Note: Objective measures were completed at Evaluation unless otherwise noted.  DIAGNOSTIC FINDINGS: none recent  COGNITION: Overall cognitive status: Within functional limits for tasks assessed   SENSATION: Intact in B LEs to light touch   COORDINATION: Alternating pronation/supination: slightly slowed Alternating toe tap: B WNL Finger to nose: B WNL   POSTURE: rounded shoulders and forward head   *PALPATION: no TTP in B calf; slightly more soft tissue restriction on L  LOWER EXTREMITY ROM:     Active  Right Eval Left Eval  Hip flexion    Hip extension    Hip abduction    Hip adduction    Hip internal rotation    Hip external rotation    Knee flexion    Knee extension     Ankle dorsiflexion 14 *c/o pain in calf  10  Ankle plantarflexion    Ankle inversion    Ankle eversion     (Blank rows = not tested)  LOWER EXTREMITY MMT:    MMT Right Eval Left Eval  Hip flexion 4 4+  Hip extension    Hip abduction 4 4  Hip adduction 4 4  Hip internal rotation    Hip external rotation    Knee flexion 4 4+  Knee extension 4 4+  Ankle dorsiflexion 4+ 4+  Ankle plantarflexion 4+ 4+  Ankle inversion    Ankle eversion    (Blank rows = not tested)   GAIT: Gait pattern: Significant R-ward trunk lean with gait; slightly slowed gait speed  Assistive device utilized: None Level of assistance: Modified independence   FUNCTIONAL TESTS:     TODAY'S TREATMENT:                                                                                                                              DATE:   PATIENT EDUCATION: Education details: prognosis, POC, HEP, answered pt's questions on leg press machine, edu on aquatic therapy as pt requests this Person educated: Patient Education method: Explanation, Demonstration, Tactile cues, Verbal cues, and Handouts Education comprehension: verbalized understanding and returned demonstration  HOME EXERCISE PROGRAM: Access Code: [email or web address] URL: [email or web address] Date: 08/13/2023 Prepared by: [facility] - Outpatient  Rehab - [facility]  Exercises - Standing Gastroc Stretch at Counter  - 1 x daily - 5 x weekly - 2 sets - 30 sec hold - Seated Hamstring Stretch  - 1 x daily - 5 x weekly - 2 sets - 30 sec hold - Standing Hip Abduction with Counter Support  - 1 x daily - 5 x weekly - 2 sets - 10 reps - Standing Hip Extension with Counter Support  - 1 x daily - 5 x weekly - 2 sets - 10 reps - Mini Squat with Counter Support  - 1 x daily - 5 x weekly - 2 sets - 10 reps  GOALS: Goals reviewed with patient? Yes   SHORT TERM GOALS: Target date: 10/26/2023  Patient to be independent with initial HEP. Baseline:  HEP initiated Goal status: MET12/07/2023  Pt will perform Condition 4 on MCTSIB for 30 seconds moderate or less sway Baseline:  Severe sway 20 sec; mild-mod sway 30 sec 11/09/23 Goal status:  MET 11/09/23     LONG TERM GOALS: Target date: 12/21/23  Patient to be independent with advanced HEP. Baseline: Not yet initiated; reports "not doing all the things, but some of them about 1x/day" Goal status: IN PROGRESS 11/09/23  Patient to demonstrate B LE strength >/=4+/5.  Baseline: See above; improved but not yet met  11/09/23 Goal status: IN PROGRESS  11/09/23  Patient to demonstrate L ankle dorsiflexion AROM to at least 14 deg in order to improve efficiency of gait.   Baseline: 10 deg>15 degrees Goal status: MET 09/28/2023  Patient to demonstrate safe stair navigation with alternating reciprocal pattern and no UE support.  Baseline: alternating reciprocal pattern with 1-2 UE support required ; continues to need UE support and alt pattern; able to perform without UE support but with limited eccentric control  11/09/23 Goal status: IN PROGRESS  11/09/23  Patient to score at least 20/24 on DGI in order to decrease risk of falls.  Baseline: 17>21/24 Goal status: MET1/23/2025  Patient to demonstrate 25% improvement in R-ward trunk lean with gait.  Baseline: significant R lean>slight improvement per report 09/28/2023; unchanged  11/09/23 Goal status: IN PROGRESS  11/09/23  Pt will improve FGA score to 22/30 for decreased fall risk. Baseline:  19/30; 20/30  11/09/23 Goal status:  IN PROGRESS  11/09/23  Pt will report ability to push wife's wheelchair up and down ramp into home. Baseline:  Currently not able to do; nurse has to help; wife in hospital currently  11/09/23 Goal Status: DEFERRED  11/09/23   ASSESSMENT:  CLINICAL IMPRESSION: Patient arrived to session without new complaints. Quad strengthening was progressed with wall squats with cueing for proper alignment. Patient reported continued  sensation of tightness in the R anterior thigh which was addressed with muscle stretching. Patient demonstrated good form and control with seated resisted LE strengthening today. Continues to demonstrate progress towards goals and tolerating more challenging exercises in-session.    OBJECTIVE IMPAIRMENTS: Abnormal gait, decreased balance, difficulty walking, decreased ROM, decreased strength, impaired flexibility, impaired sensation, postural dysfunction, and pain.   ACTIVITY LIMITATIONS: carrying, lifting, bending, standing, squatting, stairs, transfers, bathing, toileting, dressing, locomotion level, and caring for others  PARTICIPATION LIMITATIONS: meal prep, cleaning, laundry, shopping, community activity, occupation, yard work, and church  PERSONAL FACTORS: Age, Past/current experiences, Time since onset of injury/illness/exacerbation, and 3+ comorbidities: HFrEF, atrial fibrillation, VF cardiac arrest s/p ICD, CAD, severe aortic regurgitation s/p AVR 10/2018 and aortic root replacement with CABGx1, aortic dilation, hypertension, hypothyroidism, type 2 diabetes, multiple myeloma, CKD IIIb-IV, migraine  are also affecting patient's functional outcome.   REHAB POTENTIAL: Good  CLINICAL DECISION MAKING: Evolving/moderate complexity  EVALUATION COMPLEXITY: Moderate  PLAN:  PT FREQUENCY: 1x/week  PT DURATION: 6 weeks  PLANNED INTERVENTIONS: 97164- PT Re-evaluation, 97110-Therapeutic exercises, 97530- Therapeutic activity, [id number]- Neuromuscular re-education, 97535- Self Care, 16109- Manual therapy, [phone]- Gait training, [phone]- Canalith repositioning, [phone]- Aquatic Therapy, Patient/Family education, Balance training, Stair training, Taping, Dry Needling, Joint mobilization, Spinal mobilization, Vestibular training, DME instructions, Cryotherapy, and Moist heat   PLAN FOR NEXT SESSION:  discuss DC on last appt; Warm up with recumbent bike for RLE flexibility; progress Bilat hip and R knee  strength, stairs-step ups, step downs, step taps (consider adding to HEP), balance challenges on level/unlevel surfaces    Baldemar Friday, PT, DPT 12/07/23 10:18 AM  [facility] Outpatient Rehab at [facility] [address], [address] [place], [place] [address] Phone # [phone] Fax # [phone]

## 2023-12-07 ENCOUNTER — Ambulatory Visit: Payer: Medicare Other | Admitting: Physical Therapy

## 2023-12-07 ENCOUNTER — Encounter: Payer: Self-pay | Admitting: Physical Therapy

## 2023-12-07 DIAGNOSIS — R2681 Unsteadiness on feet: Secondary | ICD-10-CM | POA: Diagnosis not present

## 2023-12-07 DIAGNOSIS — R2689 Other abnormalities of gait and mobility: Secondary | ICD-10-CM

## 2023-12-07 DIAGNOSIS — M6281 Muscle weakness (generalized): Secondary | ICD-10-CM | POA: Diagnosis not present

## 2023-12-10 ENCOUNTER — Ambulatory Visit: Payer: Medicare Other | Admitting: Physical Therapy

## 2023-12-11 ENCOUNTER — Ambulatory Visit (INDEPENDENT_AMBULATORY_CARE_PROVIDER_SITE_OTHER): Payer: Medicare Other

## 2023-12-11 DIAGNOSIS — I428 Other cardiomyopathies: Secondary | ICD-10-CM

## 2023-12-12 ENCOUNTER — Encounter: Payer: Self-pay | Admitting: Physical Therapy

## 2023-12-12 ENCOUNTER — Ambulatory Visit: Admitting: Physical Therapy

## 2023-12-12 DIAGNOSIS — R2689 Other abnormalities of gait and mobility: Secondary | ICD-10-CM

## 2023-12-12 DIAGNOSIS — R2681 Unsteadiness on feet: Secondary | ICD-10-CM | POA: Diagnosis not present

## 2023-12-12 DIAGNOSIS — M6281 Muscle weakness (generalized): Secondary | ICD-10-CM

## 2023-12-12 NOTE — Therapy (Signed)
 OUTPATIENT PHYSICAL THERAPY TREATMENT NOTE   Patient Name: Eric [name]., MD MRN: [id number] DOB:06-Nov-1950, 73 y.o., male Today's Date: 12/12/2023   PCP:    Dorothyann Peng, MD   REFERRING PROVIDER: Chilton Si, MD     END OF SESSION:  PT End of Session - 12/12/23 0853     Visit Number 24    Number of Visits 25    Date for PT Re-Evaluation 12/21/23    Authorization Type Medicare/[employer]    Progress Note Due on Visit --   PN was done on Visit 19   PT Start Time 0853   pt late   PT Stop Time 0931    PT Time Calculation (min) 38 min    Activity Tolerance Patient tolerated treatment well    Behavior During Therapy [facility] for tasks assessed/performed                      Past Medical History:  Diagnosis Date   Acute blood loss anemia    Acute encephalopathy    Acute hypoxemic respiratory failure (HCC)    Acute idiopathic gout of right ankle    Acute lumbar back pain    Acute on chronic systolic heart failure (HCC)    Acute respiratory failure (HCC)    AKI (acute kidney injury) (HCC)    Altered mental status    Aortic aneurysm without rupture (HCC) 02/09/2016   Aortic root enlargement (HCC) 02/13/2012   Aortic valve regurgitation, acquired 02/13/2012   Arrhythmia    Atrial flutter (HCC) 03/18/2012   Back pain    Cardiac arrest (HCC) 02/01/2016   CHF (congestive heart failure) (HCC)    Chronic anticoagulation 03/04/2013   Chronic combined systolic and diastolic heart failure (HCC)    Chronic kidney disease    kidney fx studies increased    Chronic lower back pain    Chronic renal insufficiency    Chronic systolic heart failure (HCC) 02/13/2012   Recent diagnosis 4 / 2013, LVEF 25% by Echo 12/2011  03/2013: Echo at [facility] Cardiology Conclusions: 1. Left ventricular ejection fraction estimated by 2D at 40-45 percent. 2. Mild concentric left ventricular hypertrophy. 3. Mild left atrial enlargement. 4. Moderate aortic valve regurgitation. 5. The aortic  root at the sinus(es) of valsalva is moderately dilated 6. Mild mitral valve regurgitation. 7.   CKD (chronic kidney disease)    CKD (chronic kidney disease), stage IV (HCC) 08/06/2013   Creatinine 2.4 on 07/04/13    Claustrophobia    Colon cancer screening 03/04/2013   Debility 02/22/2016   Diabetes mellitus type 2 in nonobese [facility])    Diabetes mellitus type 2 in obese    Dysrhythmia    "palpitations"   Encounter for central line placement    Exertional dyspnea 01/2012   Femoral nerve injury 02/22/2016   Femoral neuropathy    Goals of care, counseling/discussion 05/05/2020   HCAP (healthcare-associated pneumonia)    Heart murmur    Hyperlipidemia 02/13/2012   Hypertension    Hypothyroidism    Internal hemorrhoids without mention of complication 04/11/2013   Labile blood pressure    Left bundle branch block 02/13/2012   Leg weakness, bilateral    Long term (current) use of anticoagulants 08/06/2013   Eliquis therapy    Long term current use of amiodarone 08/07/2016   Lower extremity weakness    Migraine 02/13/2012   "opthalmic"   Multiple myeloma (HCC) 05/05/2020   Non-traumatic rhabdomyolysis    Obesity (BMI 30-39.9)  02/13/2012   Pain    Paroxysmal atrial fibrillation (HCC)    Pneumonia    Retroperitoneal bleed    Right ankle pain    Right knee pain    Severe aortic regurgitation 02/09/2016   Special screening for malignant neoplasms, colon 04/11/2013   Thyroid activity decreased    Tibial pain    Varicose vein of leg    right   Ventricular fibrillation (HCC) 02/09/2016   Weakness of both lower extremities    Past Surgical History:  Procedure Laterality Date   AORTIC VALVE REPLACEMENT  11/20/2018   CARDIAC CATHETERIZATION N/A 02/17/2016   Procedure: Left Heart Cath and Coronary Angiography;  Surgeon: Corky Crafts, MD;  Location: [facility] INVASIVE CV LAB;  Service: Cardiovascular;  Laterality: N/A;   CARDIOVERSION  03/22/2012   Procedure: CARDIOVERSION;  Surgeon:  Donato Schultz, MD;  Location: [facility] ENDOSCOPY;  Service: Cardiovascular;  Laterality: N/A;   CARDIOVERSION  04/19/2012   Procedure: CARDIOVERSION;  Surgeon: Lesleigh Noe, MD;  Location: [facility] OR;  Service: Cardiovascular;  Laterality: N/A;   COLONOSCOPY N/A 04/11/2013   Procedure: COLONOSCOPY;  Surgeon: Louis Meckel, MD;  Location: [facility] ENDOSCOPY;  Service: Endoscopy;  Laterality: N/A;   COLONOSCOPY N/A 04/11/2013   Procedure: COLONOSCOPY;  Surgeon: Louis Meckel, MD;  Location: [facility] ENDOSCOPY;  Service: Endoscopy;  Laterality: N/A;   EP IMPLANTABLE DEVICE N/A 02/17/2016   Procedure: BiV ICD Insertion CRT-D;  Surgeon: Marinus Maw, MD;  Location: [facility] INVASIVE CV LAB;  Service: Cardiovascular;  Laterality: N/A;   FINGER SURGERY  2012   "4th digit right hand; thumb on left hand"   RADIOLOGY WITH ANESTHESIA N/A 02/11/2016   Procedure: MRI OF THE BRAIN WITHOUT CONTRAST, LUMBAR WITHOUT CONTRAST;  Surgeon: Medication Radiologist, MD;  Location: [facility] OR;  Service: Radiology;  Laterality: N/A;  DR. WOOD/MRI   RIGHT/LEFT HEART CATH AND CORONARY ANGIOGRAPHY N/A 07/26/2018   Procedure: RIGHT/LEFT HEART CATH AND CORONARY ANGIOGRAPHY;  Surgeon: Lyn Records, MD;  Location: [facility] INVASIVE CV LAB;  Service: Cardiovascular;  Laterality: N/A;   Skin melanocytoma excision  2012   "above left clavicle"   STERNAL INCISION RECLOSURE  11/2018   STERNAL WIRE REMOVAL  11/2018   STERNAL WOUND DEBRIDEMENT  11/2018   TEE WITHOUT CARDIOVERSION  03/22/2012   Procedure: TRANSESOPHAGEAL ECHOCARDIOGRAM (TEE);  Surgeon: Donato Schultz, MD;  Location: [facility] ENDOSCOPY;  Service: Cardiovascular;  Laterality: N/A;   TEE WITHOUT CARDIOVERSION N/A 02/08/2016   Procedure: TRANSESOPHAGEAL ECHOCARDIOGRAM (TEE);  Surgeon: Lewayne Bunting, MD;  Location: [facility] ENDOSCOPY;  Service: Cardiovascular;  Laterality: N/A;   Patient Active Problem List   Diagnosis Date Noted   Enlarged prostate 09/10/2023   Dyslipidemia due to type 2 diabetes mellitus (HCC)  09/10/2023   Acquired thrombophilia (HCC) 05/14/2023   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   Goals of care, counseling/discussion 05/05/2020   Multiple myeloma (HCC) 05/05/2020   S/P flap graft 01/07/2019   Coronary artery disease 01/07/2019   Long term (current) use of antibiotics 01/03/2019   Essential hypertension 12/27/2018   Postoperative infection of wound of sternum 12/27/2018   Decreased strength, endurance, and mobility 12/27/2018   Cardiac LV ejection fraction 30-35% 12/27/2018   Decreased activities of daily living (ADL) 12/27/2018   Klebsiella infection 12/27/2018   S/P AVR (aortic valve replacement) 11/23/2018   S/P ascending aortic aneurysm repair 11/23/2018   NICM (nonischemic cardiomyopathy) (HCC) 11/19/2018   AICD (automatic cardioverter/defibrillator) present 11/19/2018   Other symptoms and  signs involving the musculoskeletal system 05/08/2018   Long term current use of amiodarone 08/07/2016   Type 2 diabetes mellitus with stage 3b chronic kidney disease, without long-term current use of insulin (HCC)    Femoral neuropathy    Leg weakness, bilateral    Paroxysmal atrial fibrillation (HCC)    Ventricular fibrillation (HCC) 02/09/2016   Cardiac arrest (HCC) 02/01/2016   Thyroid activity decreased    Arrhythmia    CKD (chronic kidney disease), stage IV (HCC) 08/06/2013    Class: Chronic   Long term current use of anticoagulant 03/04/2013   Atrial flutter (HCC) 03/18/2012    Class: Acute   Chronic systolic heart failure (HCC) 02/13/2012    Class: Acute   Hypertension, accelerated 02/13/2012   Hyperlipidemia 02/13/2012   Left bundle branch block 02/13/2012   Obesity (BMI 30-39.9) 02/13/2012    ONSET DATE: "quite some time" reports years   REFERRING DIAG:  G62.9 (ICD-10-CM) - Neuropathy Reprot THERAPY DIAG:  Unsteadiness on feet  Muscle weakness (generalized)  Other abnormalities of gait and mobility  Rationale for Evaluation and Treatment:  Rehabilitation  SUBJECTIVE:                                                                                                                                                                                             SUBJECTIVE STATEMENT: Been up and down steps a few times recently, and I think the therapy has been helping.   Pt accompanied by: self  PERTINENT HISTORY: HFrEF, atrial fibrillation, VF cardiac arrest s/p ICD, CAD, severe aortic regurgitation s/p AVR 10/2018 and aortic root replacement with CABGx1, aortic dilation, hypertension, hypothyroidism, type 2 diabetes, multiple myeloma, CKD IIIb-IV, migraine  PAIN:  Are you having pain? Yes: NPRS scale: 0/10 Pain location: R knee Pain description: tight, stiff, numb  Aggravating factors: reports this is chronic; worse in AM Relieving factors: exercises in bed   PRECAUTIONS: Fall and ICD/Pacemaker  RED FLAGS: None   WEIGHT BEARING RESTRICTIONS: No  FALLS: Has patient fallen in last 6 months? No  LIVING ENVIRONMENT: Lives with: lives with their spouse; caregiver for his spouse and requires ability to transfer her Lives in: House/apartment Stairs:  4 steps to enter; 3 story home Has following equipment at home: Single point cane, Shower bench, and raised commode  PLOF: Independent; caregiver for wife; working part time as Ophthalmologist- requires sitting, standing, walking   PATIENT GOALS: improve R LE strength  OBJECTIVE:     TODAY'S TREATMENT: 12/12/23 Activity Comments  Bike L3.0 x 6 min BLEs  Maintaining 50 RPM  Forward single limbstep ups, 10 reps  R mod thomas stretch with strap 2x30" Initial cues for alignment, good response to stretch  sitting HS curl with pulley 2x10  10# R,  Cueing for slow eccentric control, improved on 2nd set  Seated LAQ, green band 2 x 10. BLEs Cues for technique with band, eccentric control  Weighted  carry, 7#, 50 reps x 4 Cues for abdominal activation to decrease trunk lean       HOME EXERCISE PROGRAM Access Code: [email or web address] URL: [email or web address] Date: 12/07/2023 Prepared by: [facility] - Outpatient  Rehab - [facility]  Exercises - Standing Hip Abduction with Counter Support  - 1 x daily - 5 x weekly - 2 sets - 10 reps - Side Stepping with Resistance at Ankles and Counter Support  - 1 x daily - 7 x weekly - 1 sets - 3-5 reps - Sidelying Hip Adduction  - 1 x daily - 5 x weekly - 2 sets - 10 reps - Hip Flexor/Ankle strengthening  - 1 x daily - 7 x weekly - 1 sets - 3 reps - 30 sec hold - Tall Kneeling Hip Hinge  - 1 x daily - 7 x weekly - Seated Long Arc Quad with Ankle Weight  - 1 x daily - 5 x weekly - 2 sets - 10 reps - Sit to Stand  - 1 x daily - 5 x weekly - 2 sets - 10 reps - Step Up  - 1 x daily - 7 x weekly - 3 sets - 10 reps - Tandem Walking with Counter Support  - 1 x daily - 5 x weekly - 2 sets - 5 reps - Walking with Eyes Closed and Counter Support  - 1 x daily - 5 x weekly - 2 sets - 5 reps - Modified Thomas Stretch  - 1 x daily - 5 x weekly - 2 sets - 30 sec hold    PATIENT EDUCATION: Education details: reviewed HEP update, reviewed [employer] or [employer] exercise options to maintain fitness after DC: discussed POC, with planned d/c next session Person educated: Patient Education method: Explanation, Demonstration, Tactile cues, Verbal cues, and Handouts Education comprehension: verbalized understanding and returned demonstration      Note: Objective measures were completed at Evaluation unless otherwise noted.  DIAGNOSTIC FINDINGS: none recent  COGNITION: Overall cognitive status: Within functional limits for tasks assessed   SENSATION: Intact in B LEs to light touch   COORDINATION: Alternating pronation/supination: slightly slowed Alternating toe tap: B WNL Finger to nose: B WNL   POSTURE: rounded shoulders and forward head   *PALPATION: no TTP in B calf; slightly more soft tissue restriction on  L  LOWER EXTREMITY ROM:     Active  Right Eval Left Eval  Hip flexion    Hip extension    Hip abduction    Hip adduction    Hip internal rotation    Hip external rotation    Knee flexion    Knee extension    Ankle dorsiflexion 14 *c/o pain in calf  10  Ankle plantarflexion    Ankle inversion    Ankle eversion     (Blank rows = not tested)  LOWER EXTREMITY MMT:    MMT Right Eval Left Eval  Hip flexion 4 4+  Hip extension    Hip abduction 4 4  Hip adduction 4 4  Hip internal rotation    Hip external rotation    Knee flexion 4 4+  Knee extension 4 4+  Ankle dorsiflexion 4+  4+  Ankle plantarflexion 4+ 4+  Ankle inversion    Ankle eversion    (Blank rows = not tested)   GAIT: Gait pattern: Significant R-ward trunk lean with gait; slightly slowed gait speed  Assistive device utilized: None Level of assistance: Modified independence   FUNCTIONAL TESTS:     TODAY'S TREATMENT:                                                                                                                              DATE:   PATIENT EDUCATION: Education details: prognosis, POC, HEP, answered pt's questions on leg press machine, edu on aquatic therapy as pt requests this Person educated: Patient Education method: Explanation, Demonstration, Tactile cues, Verbal cues, and Handouts Education comprehension: verbalized understanding and returned demonstration  HOME EXERCISE PROGRAM: Access Code: [email or web address] URL: [email or web address] Date: 08/13/2023 Prepared by: [facility] - Outpatient  Rehab - [facility]  Exercises - Standing Gastroc Stretch at Counter  - 1 x daily - 5 x weekly - 2 sets - 30 sec hold - Seated Hamstring Stretch  - 1 x daily - 5 x weekly - 2 sets - 30 sec hold - Standing Hip Abduction with Counter Support  - 1 x daily - 5 x weekly - 2 sets - 10 reps - Standing Hip Extension with Counter Support  - 1 x daily - 5 x weekly - 2 sets - 10 reps -  Mini Squat with Counter Support  - 1 x daily - 5 x weekly - 2 sets - 10 reps  GOALS: Goals reviewed with patient? Yes   SHORT TERM GOALS: Target date: 10/26/2023  Patient to be independent with initial HEP. Baseline: HEP initiated Goal status: MET12/07/2023  Pt will perform Condition 4 on MCTSIB for 30 seconds moderate or less sway Baseline:  Severe sway 20 sec; mild-mod sway 30 sec 11/09/23 Goal status:  MET 11/09/23     LONG TERM GOALS: Target date: 12/21/23  Patient to be independent with advanced HEP. Baseline: Not yet initiated; reports "not doing all the things, but some of them about 1x/day" Goal status: IN PROGRESS 11/09/23  Patient to demonstrate B LE strength >/=4+/5.  Baseline: See above; improved but not yet met  11/09/23 Goal status: IN PROGRESS  11/09/23  Patient to demonstrate L ankle dorsiflexion AROM to at least 14 deg in order to improve efficiency of gait.   Baseline: 10 deg>15 degrees Goal status: MET 09/28/2023  Patient to demonstrate safe stair navigation with alternating reciprocal pattern and no UE support.  Baseline: alternating reciprocal pattern with 1-2 UE support required ; continues to need UE support and alt pattern; able to perform without UE support but with limited eccentric control  11/09/23 Goal status: IN PROGRESS  11/09/23  Patient to score at least 20/24 on DGI in order to decrease risk of falls.  Baseline: 17>21/24 Goal status: MET1/23/2025  Patient to demonstrate 25% improvement in R-ward trunk lean with gait.  Baseline: significant R lean>slight improvement per report 09/28/2023; unchanged  11/09/23 Goal status: IN PROGRESS  11/09/23  Pt will improve FGA score to 22/30 for decreased fall risk. Baseline:  19/30; 20/30  11/09/23 Goal status:  IN PROGRESS  11/09/23  Pt will report ability to push wife's wheelchair up and down ramp into home. Baseline:  Currently not able to do; nurse has to help; wife in hospital currently  11/09/23 Goal Status:  DEFERRED  11/09/23   ASSESSMENT:  CLINICAL IMPRESSION: Pt presents today without new complaints. Skilled PT session focused on strenghthening for improved gait and stair negotiation. Reviewed stretch given last visit, with pt reporting not yet having a chance to do at home, given recent passing of his wife.  He demo good attention to eccentric control with exercises and with weighted carry is able to demo decreased lateral trunk lean.  He is progressing towards goals and has verbalized plans to transition to community fitness after plans for d/c next session.   OBJECTIVE IMPAIRMENTS: Abnormal gait, decreased balance, difficulty walking, decreased ROM, decreased strength, impaired flexibility, impaired sensation, postural dysfunction, and pain.   ACTIVITY LIMITATIONS: carrying, lifting, bending, standing, squatting, stairs, transfers, bathing, toileting, dressing, locomotion level, and caring for others  PARTICIPATION LIMITATIONS: meal prep, cleaning, laundry, shopping, community activity, occupation, yard work, and church  PERSONAL FACTORS: Age, Past/current experiences, Time since onset of injury/illness/exacerbation, and 3+ comorbidities: HFrEF, atrial fibrillation, VF cardiac arrest s/p ICD, CAD, severe aortic regurgitation s/p AVR 10/2018 and aortic root replacement with CABGx1, aortic dilation, hypertension, hypothyroidism, type 2 diabetes, multiple myeloma, CKD IIIb-IV, migraine  are also affecting patient's functional outcome.   REHAB POTENTIAL: Good  CLINICAL DECISION MAKING: Evolving/moderate complexity  EVALUATION COMPLEXITY: Moderate  PLAN:  PT FREQUENCY: 1x/week  PT DURATION: 6 weeks  PLANNED INTERVENTIONS: 97164- PT Re-evaluation, 97110-Therapeutic exercises, 97530- Therapeutic activity, 97112- Neuromuscular re-education, 97535- Self Care, 16109- Manual therapy, [phone]- Gait training, [phone]- Canalith repositioning, [phone]- Aquatic Therapy, Patient/Family education, Balance  training, Stair training, Taping, Dry Needling, Joint mobilization, Spinal mobilization, Vestibular training, DME instructions, Cryotherapy, and Moist heat   PLAN FOR NEXT SESSION:  Check LTGs and plan for d/c (pt had to reschedule last appt due to being out of town, so may need to do recert/discharge)   Lonia Blood, PT 12/12/23 11:32 AM Phone: [phone] Fax: [phone]  [facility] Rehab at [facility] [address] [place], [place] [address] Phone # [phone] Fax # [phone]

## 2023-12-13 LAB — CUP PACEART REMOTE DEVICE CHECK
Battery Remaining Longevity: 9 mo
Battery Remaining Percentage: 10 %
Battery Voltage: 2.68 V
Date Time Interrogation Session: 20250318020018
HighPow Impedance: 51 Ohm
HighPow Impedance: 51 Ohm
Implantable Lead Connection Status: 753985
Implantable Lead Connection Status: 753985
Implantable Lead Connection Status: 753985
Implantable Lead Implant Date: 20170525
Implantable Lead Implant Date: 20170525
Implantable Lead Implant Date: 20170525
Implantable Lead Location: 753858
Implantable Lead Location: 753859
Implantable Lead Location: 753860
Implantable Lead Model: 7122
Implantable Pulse Generator Implant Date: 20170525
Lead Channel Impedance Value: 380 Ohm
Lead Channel Impedance Value: 430 Ohm
Lead Channel Impedance Value: 560 Ohm
Lead Channel Pacing Threshold Amplitude: 0.5 V
Lead Channel Pacing Threshold Amplitude: 1.25 V
Lead Channel Pacing Threshold Pulse Width: 0.5 ms
Lead Channel Pacing Threshold Pulse Width: 0.5 ms
Lead Channel Sensing Intrinsic Amplitude: 0.2 mV
Lead Channel Sensing Intrinsic Amplitude: 12 mV
Lead Channel Setting Pacing Amplitude: 2 V
Lead Channel Setting Pacing Amplitude: 2.5 V
Lead Channel Setting Pacing Pulse Width: 0.5 ms
Lead Channel Setting Pacing Pulse Width: 0.5 ms
Lead Channel Setting Sensing Sensitivity: 0.5 mV
Pulse Gen Serial Number: 7357926
Zone Setting Status: 755011

## 2023-12-17 ENCOUNTER — Encounter: Payer: Self-pay | Admitting: Internal Medicine

## 2023-12-17 ENCOUNTER — Ambulatory Visit: Payer: Medicare Other | Admitting: Physical Therapy

## 2023-12-27 DIAGNOSIS — N1832 Chronic kidney disease, stage 3b: Secondary | ICD-10-CM | POA: Diagnosis not present

## 2023-12-28 ENCOUNTER — Ambulatory Visit: Attending: Cardiovascular Disease | Admitting: Physical Therapy

## 2023-12-28 ENCOUNTER — Encounter: Payer: Self-pay | Admitting: Hematology & Oncology

## 2023-12-28 ENCOUNTER — Inpatient Hospital Stay: Payer: Medicare Other | Admitting: Hematology & Oncology

## 2023-12-28 ENCOUNTER — Encounter: Payer: Self-pay | Admitting: Physical Therapy

## 2023-12-28 ENCOUNTER — Inpatient Hospital Stay: Payer: Medicare Other | Attending: Hematology & Oncology

## 2023-12-28 VITALS — BP 130/85 | HR 86 | Resp 20 | Ht 68.0 in | Wt 196.0 lb

## 2023-12-28 DIAGNOSIS — M6281 Muscle weakness (generalized): Secondary | ICD-10-CM | POA: Diagnosis not present

## 2023-12-28 DIAGNOSIS — C9000 Multiple myeloma not having achieved remission: Secondary | ICD-10-CM

## 2023-12-28 DIAGNOSIS — R2681 Unsteadiness on feet: Secondary | ICD-10-CM | POA: Diagnosis not present

## 2023-12-28 DIAGNOSIS — R2689 Other abnormalities of gait and mobility: Secondary | ICD-10-CM | POA: Diagnosis not present

## 2023-12-28 DIAGNOSIS — Z79899 Other long term (current) drug therapy: Secondary | ICD-10-CM | POA: Insufficient documentation

## 2023-12-28 DIAGNOSIS — M7989 Other specified soft tissue disorders: Secondary | ICD-10-CM | POA: Diagnosis not present

## 2023-12-28 DIAGNOSIS — N1832 Chronic kidney disease, stage 3b: Secondary | ICD-10-CM | POA: Diagnosis not present

## 2023-12-28 DIAGNOSIS — N189 Chronic kidney disease, unspecified: Secondary | ICD-10-CM | POA: Diagnosis not present

## 2023-12-28 DIAGNOSIS — D631 Anemia in chronic kidney disease: Secondary | ICD-10-CM | POA: Diagnosis not present

## 2023-12-28 DIAGNOSIS — N2581 Secondary hyperparathyroidism of renal origin: Secondary | ICD-10-CM | POA: Diagnosis not present

## 2023-12-28 DIAGNOSIS — I129 Hypertensive chronic kidney disease with stage 1 through stage 4 chronic kidney disease, or unspecified chronic kidney disease: Secondary | ICD-10-CM | POA: Diagnosis not present

## 2023-12-28 LAB — CMP (CANCER CENTER ONLY)
ALT: 16 U/L (ref 0–44)
AST: 23 U/L (ref 15–41)
Albumin: 3.4 g/dL — ABNORMAL LOW (ref 3.5–5.0)
Alkaline Phosphatase: 70 U/L (ref 38–126)
Anion gap: 10 (ref 5–15)
BUN: 34 mg/dL — ABNORMAL HIGH (ref 8–23)
CO2: 23 mmol/L (ref 22–32)
Calcium: 8.3 mg/dL — ABNORMAL LOW (ref 8.9–10.3)
Chloride: 107 mmol/L (ref 98–111)
Creatinine: 2.02 mg/dL — ABNORMAL HIGH (ref 0.61–1.24)
GFR, Estimated: 34 mL/min — ABNORMAL LOW (ref >60)
Glucose, Bld: 155 mg/dL — ABNORMAL HIGH (ref 70–99)
Potassium: 3.4 mmol/L — ABNORMAL LOW (ref 3.5–5.1)
Sodium: 140 mmol/L (ref 135–145)
Total Bilirubin: 1.6 mg/dL — ABNORMAL HIGH (ref 0.0–1.2)
Total Protein: 5.6 g/dL — ABNORMAL LOW (ref 6.5–8.1)

## 2023-12-28 LAB — CBC WITH DIFFERENTIAL (CANCER CENTER ONLY)
Abs Immature Granulocytes: 0.03 10*3/uL (ref 0.00–0.07)
Basophils Absolute: 0 10*3/uL (ref 0.0–0.1)
Basophils Relative: 0 %
Eosinophils Absolute: 0.1 10*3/uL (ref 0.0–0.5)
Eosinophils Relative: 3 %
HCT: 35 % — ABNORMAL LOW (ref 39.0–52.0)
Hemoglobin: 11.8 g/dL — ABNORMAL LOW (ref 13.0–17.0)
Immature Granulocytes: 1 %
Lymphocytes Relative: 14 %
Lymphs Abs: 0.7 10*3/uL (ref 0.7–4.0)
MCH: 31.6 pg (ref 26.0–34.0)
MCHC: 33.7 g/dL (ref 30.0–36.0)
MCV: 93.6 fL (ref 80.0–100.0)
Monocytes Absolute: 0.8 10*3/uL (ref 0.1–1.0)
Monocytes Relative: 14 %
Neutro Abs: 3.6 10*3/uL (ref 1.7–7.7)
Neutrophils Relative %: 68 %
Platelet Count: 150 10*3/uL (ref 150–400)
RBC: 3.74 MIL/uL — ABNORMAL LOW (ref 4.22–5.81)
RDW: 16.7 % — ABNORMAL HIGH (ref 11.5–15.5)
WBC Count: 5.3 10*3/uL (ref 4.0–10.5)
nRBC: 0 % (ref 0.0–0.2)

## 2023-12-28 LAB — LACTATE DEHYDROGENASE: LDH: 188 U/L (ref 98–192)

## 2023-12-28 NOTE — Addendum Note (Signed)
 Addended by: Gean Maidens on: 12/28/2023 10:30 AM   Modules accepted: Orders

## 2023-12-28 NOTE — Progress Notes (Signed)
 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 12/28/2023   Principle Diagnosis:  IgG kappa myeloma  - 1q+   Current Therapy:        CyBorD -- s/p cycle  #19 - started on 06/04/2020 --last treatment in 08/2022 --on hold for now.   Interim History:  [name] is here today for follow-up.  Unfortunately, his wife passed on 3 weeks ago.  She likely suffered a cardiac event.  He was with her.  She was in the hospital.  They were married I think 43 years.  I know that there is some have been coming up from [place] to try to help out.  He is managing.  Thankfully, he still working.  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  He is quite busy.  We have not treated him now for 16 months for the myeloma.  So far, everything really has been holding nice and steady.  His last myeloma studies did not show monoclonal spike in his blood.  His IgG level was 636 mg/dL.  The kappa light chain was 4.4 mg/dL.  There has been no issues with nausea or vomiting.  He has had no issues with pain.  He has had no cough or shortness of breath.  He has had no problems with COVID or Influenza.  Overall, I would say that his performance status is probably ECOG 1.    Medications:  Allergies as of 12/28/2023       Reactions   Lactose Diarrhea   Lactose Intolerance (gi) Diarrhea        Medication List        Accurate as of December 28, 2023  1:39 PM. If you have any questions, ask your nurse or doctor.          STOP taking these medications    azithromycin 250 MG tablet Commonly known as: ZITHROMAX Stopped by: Josph Macho   famciclovir 500 MG tablet Commonly known as: FAMVIR Stopped by: Josph Macho   ferrous sulfate 324 (65 Fe) MG Tbec Stopped by: Josph Macho       TAKE these medications    acetaminophen 500 MG tablet Commonly known as: TYLENOL Take 1,000 mg by mouth every 4 (four) hours as needed for moderate pain or headache.    allopurinol 100 MG tablet Commonly known as: Zyloprim Take 1 tablet (100 mg total) by mouth daily.   amiodarone 100 MG tablet Commonly known as: PACERONE Take 1 tablet (100 mg total) by mouth daily.   apixaban 5 MG Tabs tablet Commonly known as: Eliquis Take 1 tablet (5 mg total) by mouth 2 (two) times daily.   ARTIFICIAL TEARS OP Place 1 drop into both eyes daily as needed (dry eyes).   ascorbic acid 500 MG tablet Commonly known as: VITAMIN C Take 500 mg by mouth daily.   carvedilol 6.25 MG tablet Commonly known as: COREG Take 1 tablet by mouth twice a day   cholecalciferol 25 MCG (1000 UNIT) tablet Commonly known as: VITAMIN D3 Take 5,000 Units by mouth daily.   Farxiga 10 MG Tabs tablet Generic drug: dapagliflozin propanediol TAKE 1 TABLET(10 MG) BY MOUTH DAILY BEFORE BREAKFAST   fluticasone 50 MCG/ACT nasal spray Commonly known as: FLONASE Place 2 sprays into both nostrils daily.   furosemide 80 MG tablet Commonly known as: LASIX Alternate one tablet (80mg ) and half tablet (40mg ) every other day. Okay to take whole tablet as needed for fluid  retention, edema.   levothyroxine 75 MCG tablet Commonly known as: SYNTHROID Take 1 tablet (75 mcg total) by mouth daily before breakfast.   loperamide 2 MG capsule Commonly known as: IMODIUM Take 2 mg by mouth as needed for diarrhea or loose stools.   potassium chloride 20 MEQ/15ML (10%) Soln TAKE 15 ML BY MOUTH TWICE DAILY What changed: See the new instructions.   rosuvastatin 20 MG tablet Commonly known as: CRESTOR TAKE 1 TABLET(20 MG) BY MOUTH DAILY   Semaglutide(0.25 or 0.5MG /DOS) 2 MG/3ML Sopn Inject 0.5 into the skin once weekly.   vitamin B-12 100 MCG tablet Commonly known as: CYANOCOBALAMIN Take 100 mcg by mouth daily.        Allergies:  Allergies  Allergen Reactions   Lactose Diarrhea   Lactose Intolerance (Gi) Diarrhea    Past Medical History, Surgical history, Social history, and Family  History were reviewed and updated.  Review of Systems: Review of Systems  Constitutional: Negative.   Eyes: Negative.   Respiratory: Negative.    Cardiovascular:  Positive for leg swelling.  Gastrointestinal: Negative.   Genitourinary: Negative.   Musculoskeletal: Negative.   Skin: Negative.   Neurological: Negative.   Endo/Heme/Allergies: Negative.   Psychiatric/Behavioral: Negative.       Physical Exam:  height is 5\' 8"  (1.727 m) and weight is 196 lb (88.9 kg). His blood pressure is 130/85 and his pulse is 86. His respiration is 20 and oxygen saturation is 95%.   Wt Readings from Last 3 Encounters:  12/28/23 196 lb (88.9 kg)  10/26/23 196 lb (88.9 kg)  10/19/23 196 lb (88.9 kg)    Physical Exam Vitals reviewed.  HENT:     Head: Normocephalic and atraumatic.  Eyes:     Pupils: Pupils are equal, round, and reactive to light.  Cardiovascular:     Rate and Rhythm: Normal rate and regular rhythm.     Heart sounds: Normal heart sounds.  Pulmonary:     Effort: Pulmonary effort is normal.     Breath sounds: Normal breath sounds.  Abdominal:     General: Bowel sounds are normal.     Palpations: Abdomen is soft.  Musculoskeletal:        General: No tenderness or deformity. Normal range of motion.     Cervical back: Normal range of motion.  Lymphadenopathy:     Cervical: No cervical adenopathy.  Skin:    General: Skin is warm and dry.     Findings: No erythema or rash.  Neurological:     Mental Status: He is alert and oriented to person, place, and time.  Psychiatric:        Behavior: Behavior normal.        Thought Content: Thought content normal.        Judgment: Judgment normal.    Lab Results  Component Value Date   WBC 4.9 10/19/2023   HGB 13.2 10/19/2023   HCT 39.7 10/19/2023   MCV 92.8 10/19/2023   PLT 175 10/19/2023   Lab Results  Component Value Date   FERRITIN 56 09/15/2022   IRON 92 09/15/2022   TIBC 349 09/15/2022   UIBC 257 09/15/2022    IRONPCTSAT 26 09/15/2022   Lab Results  Component Value Date   RETICCTPCT 1.4 09/15/2022   RBC 4.28 10/19/2023   Lab Results  Component Value Date   KPAFRELGTCHN 44.0 (H) 10/19/2023   LAMBDASER 39.6 (H) 10/19/2023   KAPLAMBRATIO 1.11 10/19/2023   Lab Results  Component Value Date  IGGSERUM 636 10/19/2023   IGA 178 10/19/2023   IGMSERUM 25 10/19/2023   Lab Results  Component Value Date   TOTALPROTELP 5.4 (L) 10/19/2023   ALBUMINELP 3.0 10/19/2023   A1GS 0.3 10/19/2023   A2GS 0.6 10/19/2023   BETS 1.0 10/19/2023   GAMS 0.5 10/19/2023   MSPIKE Not Observed 10/19/2023   SPEI Comment 04/22/2021     Chemistry      Component Value Date/Time   NA 142 10/19/2023 1213   NA 145 (H) 08/31/2023 0946   K 3.7 10/19/2023 1213   CL 108 10/19/2023 1213   CO2 25 10/19/2023 1213   BUN 33 (H) 10/19/2023 1213   BUN 33 (H) 08/31/2023 0946   CREATININE 2.01 (H) 10/19/2023 1213   CREATININE 1.95 (H) 09/08/2016 0859      Component Value Date/Time   CALCIUM 8.5 (L) 10/19/2023 1213   ALKPHOS 71 10/19/2023 1213   AST 33 10/19/2023 1213   ALT 28 10/19/2023 1213   BILITOT 1.0 10/19/2023 1213       Impression and Plan: 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  He has done incredibly well with treatment.  Again, he has been off treatment now for over a year.  His monoclonal studies have all looked pretty stable.  I would not imagine that anything will change right now.  I know this is a tough time for him.  Again, he has a good family that we will certainly help him.  We will plan to get him back after [facility].  If all looks good at that point, then we will try to get him through the Summer.   Josph Macho, MD 4/4/20251:39 PM

## 2023-12-28 NOTE — Therapy (Addendum)
 OUTPATIENT PHYSICAL THERAPY TREATMENT NOTE/RECERT/DISCHARGE   Patient Name: Eric [name]., MD MRN: [id number] DOB:Jan 16, 1951, 73 y.o., male Today's Date: 12/28/2023   PCP:    Dorothyann Peng, MD   REFERRING PROVIDER: Chilton Si, MD   PHYSICAL THERAPY DISCHARGE SUMMARY  Visits from Start of Care: 25  Current functional level related to goals / functional outcomes: See below-pt has met 6 of 7 LTGs   Remaining deficits: High level balance, strength, stiffness   Education / Equipment: Educated in [employer], community fitness, walking poles to help with longer distance gait   Patient agrees to discharge. Patient goals were met. Patient is being discharged due to being pleased with the current functional level.   END OF SESSION:  PT End of Session - 12/28/23 0936     Visit Number 25    Number of Visits 25    Date for PT Re-Evaluation 12/21/23    Authorization Type Medicare/AARP    Progress Note Due on Visit --   PN was done on Visit 19   PT Start Time 0936    PT Stop Time 1015    PT Time Calculation (min) 39 min    Activity Tolerance Patient tolerated treatment well    Behavior During Therapy [facility] for tasks assessed/performed                       Past Medical History:  Diagnosis Date   Acute blood loss anemia    Acute encephalopathy    Acute hypoxemic respiratory failure (HCC)    Acute idiopathic gout of right ankle    Acute lumbar back pain    Acute on chronic systolic heart failure (HCC)    Acute respiratory failure (HCC)    AKI (acute kidney injury) (HCC)    Altered mental status    Aortic aneurysm without rupture (HCC) 02/09/2016   Aortic root enlargement (HCC) 02/13/2012   Aortic valve regurgitation, acquired 02/13/2012   Arrhythmia    Atrial flutter (HCC) 03/18/2012   Back pain    Cardiac arrest (HCC) 02/01/2016   CHF (congestive heart failure) (HCC)    Chronic anticoagulation 03/04/2013   Chronic combined systolic and diastolic heart  failure (HCC)    Chronic kidney disease    kidney fx studies increased    Chronic lower back pain    Chronic renal insufficiency    Chronic systolic heart failure (HCC) 02/13/2012   Recent diagnosis 4 / 2013, LVEF 25% by Echo 12/2011  03/2013: Echo at [facility] Cardiology Conclusions: 1. Left ventricular ejection fraction estimated by 2D at 40-45 percent. 2. Mild concentric left ventricular hypertrophy. 3. Mild left atrial enlargement. 4. Moderate aortic valve regurgitation. 5. The aortic root at the sinus(es) of valsalva is moderately dilated 6. Mild mitral valve regurgitation. 7.   CKD (chronic kidney disease)    CKD (chronic kidney disease), stage IV (HCC) 08/06/2013   Creatinine 2.4 on 07/04/13    Claustrophobia    Colon cancer screening 03/04/2013   Debility 02/22/2016   Diabetes mellitus type 2 in nonobese [facility])    Diabetes mellitus type 2 in obese    Dysrhythmia    "palpitations"   Encounter for central line placement    Exertional dyspnea 01/2012   Femoral nerve injury 02/22/2016   Femoral neuropathy    Goals of care, counseling/discussion 05/05/2020   HCAP (healthcare-associated pneumonia)    Heart murmur    Hyperlipidemia 02/13/2012   Hypertension    Hypothyroidism    Internal  hemorrhoids without mention of complication 04/11/2013   Labile blood pressure    Left bundle branch block 02/13/2012   Leg weakness, bilateral    Long term (current) use of anticoagulants 08/06/2013   Eliquis therapy    Long term current use of amiodarone 08/07/2016   Lower extremity weakness    Migraine 02/13/2012   "opthalmic"   Multiple myeloma (HCC) 05/05/2020   Non-traumatic rhabdomyolysis    Obesity (BMI 30-39.9) 02/13/2012   Pain    Paroxysmal atrial fibrillation (HCC)    Pneumonia    Retroperitoneal bleed    Right ankle pain    Right knee pain    Severe aortic regurgitation 02/09/2016   Special screening for malignant neoplasms, colon 04/11/2013   Thyroid activity decreased     Tibial pain    Varicose vein of leg    right   Ventricular fibrillation (HCC) 02/09/2016   Weakness of both lower extremities    Past Surgical History:  Procedure Laterality Date   AORTIC VALVE REPLACEMENT  11/20/2018   CARDIAC CATHETERIZATION N/A 02/17/2016   Procedure: Left Heart Cath and Coronary Angiography;  Surgeon: Corky Crafts, MD;  Location: [facility] INVASIVE CV LAB;  Service: Cardiovascular;  Laterality: N/A;   CARDIOVERSION  03/22/2012   Procedure: CARDIOVERSION;  Surgeon: Donato Schultz, MD;  Location: [facility] ENDOSCOPY;  Service: Cardiovascular;  Laterality: N/A;   CARDIOVERSION  04/19/2012   Procedure: CARDIOVERSION;  Surgeon: Lesleigh Noe, MD;  Location: [facility] OR;  Service: Cardiovascular;  Laterality: N/A;   COLONOSCOPY N/A 04/11/2013   Procedure: COLONOSCOPY;  Surgeon: Louis Meckel, MD;  Location: [facility] ENDOSCOPY;  Service: Endoscopy;  Laterality: N/A;   COLONOSCOPY N/A 04/11/2013   Procedure: COLONOSCOPY;  Surgeon: Louis Meckel, MD;  Location: [facility] ENDOSCOPY;  Service: Endoscopy;  Laterality: N/A;   EP IMPLANTABLE DEVICE N/A 02/17/2016   Procedure: BiV ICD Insertion CRT-D;  Surgeon: Marinus Maw, MD;  Location: [facility] INVASIVE CV LAB;  Service: Cardiovascular;  Laterality: N/A;   FINGER SURGERY  2012   "4th digit right hand; thumb on left hand"   RADIOLOGY WITH ANESTHESIA N/A 02/11/2016   Procedure: MRI OF THE BRAIN WITHOUT CONTRAST, LUMBAR WITHOUT CONTRAST;  Surgeon: Medication Radiologist, MD;  Location: [facility] OR;  Service: Radiology;  Laterality: N/A;  DR. WOOD/MRI   RIGHT/LEFT HEART CATH AND CORONARY ANGIOGRAPHY N/A 07/26/2018   Procedure: RIGHT/LEFT HEART CATH AND CORONARY ANGIOGRAPHY;  Surgeon: Lyn Records, MD;  Location: [facility] INVASIVE CV LAB;  Service: Cardiovascular;  Laterality: N/A;   Skin melanocytoma excision  2012   "above left clavicle"   STERNAL INCISION RECLOSURE  11/2018   STERNAL WIRE REMOVAL  11/2018   STERNAL WOUND DEBRIDEMENT  11/2018   TEE WITHOUT CARDIOVERSION   03/22/2012   Procedure: TRANSESOPHAGEAL ECHOCARDIOGRAM (TEE);  Surgeon: Donato Schultz, MD;  Location: [facility] ENDOSCOPY;  Service: Cardiovascular;  Laterality: N/A;   TEE WITHOUT CARDIOVERSION N/A 02/08/2016   Procedure: TRANSESOPHAGEAL ECHOCARDIOGRAM (TEE);  Surgeon: Lewayne Bunting, MD;  Location: [facility] ENDOSCOPY;  Service: Cardiovascular;  Laterality: N/A;   Patient Active Problem List   Diagnosis Date Noted   Enlarged prostate 09/10/2023   Dyslipidemia due to type 2 diabetes mellitus (HCC) 09/10/2023   Acquired thrombophilia (HCC) 05/14/2023   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   Goals of care, counseling/discussion 05/05/2020   Multiple myeloma (HCC) 05/05/2020   S/P flap graft 01/07/2019   Coronary artery disease 01/07/2019   Long term (current) use of antibiotics 01/03/2019  Essential hypertension 12/27/2018   Postoperative infection of wound of sternum 12/27/2018   Decreased strength, endurance, and mobility 12/27/2018   Cardiac LV ejection fraction 30-35% 12/27/2018   Decreased activities of daily living (ADL) 12/27/2018   Klebsiella infection 12/27/2018   S/P AVR (aortic valve replacement) 11/23/2018   S/P ascending aortic aneurysm repair 11/23/2018   NICM (nonischemic cardiomyopathy) (HCC) 11/19/2018   AICD (automatic cardioverter/defibrillator) present 11/19/2018   Other symptoms and signs involving the musculoskeletal system 05/08/2018   Long term current use of amiodarone 08/07/2016   Type 2 diabetes mellitus with stage 3b chronic kidney disease, without long-term current use of insulin (HCC)    Femoral neuropathy    Leg weakness, bilateral    Paroxysmal atrial fibrillation (HCC)    Ventricular fibrillation (HCC) 02/09/2016   Cardiac arrest (HCC) 02/01/2016   Thyroid activity decreased    Arrhythmia    CKD (chronic kidney disease), stage IV (HCC) 08/06/2013    Class: Chronic   Long term current use of anticoagulant 03/04/2013   Atrial flutter (HCC) 03/18/2012     Class: Acute   Chronic systolic heart failure (HCC) 02/13/2012    Class: Acute   Hypertension, accelerated 02/13/2012   Hyperlipidemia 02/13/2012   Left bundle branch block 02/13/2012   Obesity (BMI 30-39.9) 02/13/2012    ONSET DATE: "quite some time" reports years   REFERRING DIAG:  G62.9 (ICD-10-CM) - Neuropathy Reprot THERAPY DIAG:  Unsteadiness on feet  Muscle weakness (generalized)  Other abnormalities of gait and mobility  Rationale for Evaluation and Treatment: Rehabilitation  SUBJECTIVE:                                                                                                                                                                                             SUBJECTIVE STATEMENT: Had a good trip to visit son.    Pt accompanied by: self  PERTINENT HISTORY: HFrEF, atrial fibrillation, VF cardiac arrest s/p ICD, CAD, severe aortic regurgitation s/p AVR 10/2018 and aortic root replacement with CABGx1, aortic dilation, hypertension, hypothyroidism, type 2 diabetes, multiple myeloma, CKD IIIb-IV, migraine  PAIN:  Are you having pain? Yes: NPRS scale: 0/10 Pain location: R knee Pain description: tight, stiff, numb  Aggravating factors: reports this is chronic; worse in AM Relieving factors: exercises in bed   PRECAUTIONS: Fall and ICD/Pacemaker  RED FLAGS: None   WEIGHT BEARING RESTRICTIONS: No  FALLS: Has patient fallen in last 6 months? No  LIVING ENVIRONMENT: Lives with: lives with their spouse; caregiver for his spouse and requires ability to transfer her Lives in: House/apartment Stairs:  4 steps to enter; 3 story  home Has following equipment at home: Single point cane, Shower bench, and raised commode  PLOF: Independent; caregiver for wife; working part time as Ophthalmologist- requires sitting, standing, walking   PATIENT GOALS: improve R LE strength  OBJECTIVE:   Plan for exercise is to join [employer] or [employer]-discussed options for  continued community fitness   TODAY'S TREATMENT: 12/28/2023 Activity Comments  FGA 22/30 Improved from 20/30  Stair negotiation-alt pattern with light UE support at one rail   MMT See below  Review of HEP Seated hip adduction Sidelying hip adduction, RLE Reminder Cues for technique for sidelying hip adduction  Treadmill gait, 1.2 mph, BUE support Cues for posture, step length and foot clearance  Gait with single, then bilateral walking poles, clinic distances Good seqeuncing and educated pt in how he can use for longer, outdoor distances    [facility] PT Assessment - 12/28/23 0948       Functional Gait  Assessment   Gait assessed  Yes    Gait Level Surface Walks 20 ft in less than 7 sec but greater than 5.5 sec, uses assistive device, slower speed, mild gait deviations, or deviates 6-10 in outside of the 12 in walkway width.    Change in Gait Speed Able to smoothly change walking speed without loss of balance or gait deviation. Deviate no more than 6 in outside of the 12 in walkway width.    Gait with Horizontal Head Turns Performs head turns smoothly with no change in gait. Deviates no more than 6 in outside 12 in walkway width    Gait with Vertical Head Turns Performs head turns with no change in gait. Deviates no more than 6 in outside 12 in walkway width.    Gait and Pivot Turn Pivot turns safely within 3 sec and stops quickly with no loss of balance.    Step Over Obstacle Is able to step over one shoe box (4.5 in total height) without changing gait speed. No evidence of imbalance.    Gait with Narrow Base of Support Ambulates 4-7 steps.    Gait with Eyes Closed Walks 20 ft, slow speed, abnormal gait pattern, evidence for imbalance, deviates 10-15 in outside 12 in walkway width. Requires more than 9 sec to ambulate 20 ft.    Ambulating Backwards Walks 20 ft, uses assistive device, slower speed, mild gait deviations, deviates 6-10 in outside 12 in walkway width.    Steps Alternating feet, must  use rail.    Total Score 22    FGA comment: Improved from 20/30                   HOME EXERCISE PROGRAM Access Code: [email or web address] URL: [email or web address] Date: 12/07/2023 Prepared by: [facility] - Outpatient  Rehab - [facility]  Exercises - Standing Hip Abduction with Counter Support  - 1 x daily - 5 x weekly - 2 sets - 10 reps - Side Stepping with Resistance at Ankles and Counter Support  - 1 x daily - 7 x weekly - 1 sets - 3-5 reps - Sidelying Hip Adduction  - 1 x daily - 5 x weekly - 2 sets - 10 reps - Hip Flexor/Ankle strengthening  - 1 x daily - 7 x weekly - 1 sets - 3 reps - 30 sec hold - Tall Kneeling Hip Hinge  - 1 x daily - 7 x weekly - Seated Long Arc Quad with Ankle Weight  - 1 x daily - 5 x weekly - 2 sets -  10 reps - Sit to Stand  - 1 x daily - 5 x weekly - 2 sets - 10 reps - Step Up  - 1 x daily - 7 x weekly - 3 sets - 10 reps - Tandem Walking with Counter Support  - 1 x daily - 5 x weekly - 2 sets - 5 reps - Walking with Eyes Closed and Counter Support  - 1 x daily - 5 x weekly - 2 sets - 5 reps - Modified Thomas Stretch  - 1 x daily - 5 x weekly - 2 sets - 30 sec hold    PATIENT EDUCATION: Education details: reviewed HEP update, reviewed [employer] or [employer] exercise options to maintain fitness after DC: discussed POC and plan for d/c this session Person educated: Patient Education method: Explanation, Demonstration, Tactile cues, Verbal cues, and Handouts Education comprehension: verbalized understanding and returned demonstration      Note: Objective measures were completed at Evaluation unless otherwise noted.  DIAGNOSTIC FINDINGS: none recent  COGNITION: Overall cognitive status: Within functional limits for tasks assessed   SENSATION: Intact in B LEs to light touch   COORDINATION: Alternating pronation/supination: slightly slowed Alternating toe tap: B WNL Finger to nose: B WNL   POSTURE: rounded shoulders and forward  head   *PALPATION: no TTP in B calf; slightly more soft tissue restriction on L  LOWER EXTREMITY ROM:     Active  Right Eval Left Eval  Hip flexion    Hip extension    Hip abduction    Hip adduction    Hip internal rotation    Hip external rotation    Knee flexion    Knee extension    Ankle dorsiflexion 14 *c/o pain in calf  10  Ankle plantarflexion    Ankle inversion    Ankle eversion     (Blank rows = not tested)  LOWER EXTREMITY MMT:    MMT Right Eval Right 12/28/2023 Left Eval Left 12/28/2023  Hip flexion 4 4 4+ 4+  Hip extension      Hip abduction 4 4+ 4 4+  Hip adduction 4 4+ 4 4+  Hip internal rotation      Hip external rotation      Knee flexion 4 4+ 4+ 4+  Knee extension 4 4+ 4+ 4+  Ankle dorsiflexion 4+ 4+ 4+ 4+  Ankle plantarflexion 4+  4+   Ankle inversion      Ankle eversion      (Blank rows = not tested)   GAIT: Gait pattern: Significant R-ward trunk lean with gait; slightly slowed gait speed  Assistive device utilized: None Level of assistance: Modified independence   FUNCTIONAL TESTS:   [facility] PT Assessment - 12/28/23 0948       Functional Gait  Assessment   Gait assessed  Yes    Gait Level Surface Walks 20 ft in less than 7 sec but greater than 5.5 sec, uses assistive device, slower speed, mild gait deviations, or deviates 6-10 in outside of the 12 in walkway width.    Change in Gait Speed Able to smoothly change walking speed without loss of balance or gait deviation. Deviate no more than 6 in outside of the 12 in walkway width.    Gait with Horizontal Head Turns Performs head turns smoothly with no change in gait. Deviates no more than 6 in outside 12 in walkway width    Gait with Vertical Head Turns Performs head turns with no change in gait. Deviates no  more than 6 in outside 12 in walkway width.    Gait and Pivot Turn Pivot turns safely within 3 sec and stops quickly with no loss of balance.    Step Over Obstacle Is able to step over one  shoe box (4.5 in total height) without changing gait speed. No evidence of imbalance.    Gait with Narrow Base of Support Ambulates 4-7 steps.    Gait with Eyes Closed Walks 20 ft, slow speed, abnormal gait pattern, evidence for imbalance, deviates 10-15 in outside 12 in walkway width. Requires more than 9 sec to ambulate 20 ft.    Ambulating Backwards Walks 20 ft, uses assistive device, slower speed, mild gait deviations, deviates 6-10 in outside 12 in walkway width.    Steps Alternating feet, must use rail.    Total Score 22    FGA comment: Improved from 20/30              TODAY'S TREATMENT:                                                                                                                              DATE:   PATIENT EDUCATION: Education details: prognosis, POC, HEP, answered pt's questions on leg press machine, edu on aquatic therapy as pt requests this Person educated: Patient Education method: Explanation, Demonstration, Tactile cues, Verbal cues, and Handouts Education comprehension: verbalized understanding and returned demonstration  HOME EXERCISE PROGRAM: Access Code: [email or web address] URL: [email or web address] Date: 08/13/2023 Prepared by: [facility] - Outpatient  Rehab - [facility]  Exercises - Standing Gastroc Stretch at Counter  - 1 x daily - 5 x weekly - 2 sets - 30 sec hold - Seated Hamstring Stretch  - 1 x daily - 5 x weekly - 2 sets - 30 sec hold - Standing Hip Abduction with Counter Support  - 1 x daily - 5 x weekly - 2 sets - 10 reps - Standing Hip Extension with Counter Support  - 1 x daily - 5 x weekly - 2 sets - 10 reps - Mini Squat with Counter Support  - 1 x daily - 5 x weekly - 2 sets - 10 reps  GOALS: Goals reviewed with patient? Yes   SHORT TERM GOALS: Target date: 10/26/2023  Patient to be independent with initial HEP. Baseline: HEP initiated Goal status: MET12/07/2023  Pt will perform Condition 4 on MCTSIB for 30  seconds moderate or less sway Baseline:  Severe sway 20 sec; mild-mod sway 30 sec 11/09/23 Goal status:  MET 11/09/23     LONG TERM GOALS: Target date: 12/21/23  Patient to be independent with advanced HEP. Baseline: Not yet initiated; reports "not doing all the things, but some of them about 1x/day" Goal status: MET 12/28/2023  Patient to demonstrate B LE strength >/=4+/5.  Baseline: See above, met 12/28/2023 Goal status: MET 12/28/2023  Patient to demonstrate L ankle dorsiflexion AROM to at least 14 deg in order to  improve efficiency of gait.   Baseline: 10 deg>15 degrees Goal status: MET 09/28/2023  Patient to demonstrate safe stair navigation with alternating reciprocal pattern and no UE support.  Baseline: 12/28/2023 alternating pattern with 1 UE support Goal status: PARTIALLY MET 12/28/2023  Patient to score at least 20/24 on DGI in order to decrease risk of falls.  Baseline: 17>21/24 Goal status: MET1/23/2025  Patient to demonstrate 25% improvement in R-ward trunk lean with gait.  Baseline: improved, still noted with fatigue Goal status: MET, 4/4/20255  Pt will improve FGA score to 22/30 for decreased fall risk. Baseline:  19/30; 20/30  11/09/23; 22/30 12/28/2023 Goal status:  MET 12/28/2023   ASSESSMENT:  CLINICAL IMPRESSION: Pt presents today with reports of overall improvement.  He was recently on a trip to visit son in [place] and reports several bouts of long distance walking, which he was able to tolerate well.  He voices plans for community fitness options.  Skilled PT session focused on assessing LTGs, and pt has met 6 of 7 LTGs.  He has improved stair negotiation, but needs light UE support for safety.  He has made good progress with strength and balance and is appropriate for discharge at this time.    OBJECTIVE IMPAIRMENTS: Abnormal gait, decreased balance, difficulty walking, decreased ROM, decreased strength, impaired flexibility, impaired sensation, postural dysfunction, and  pain.   ACTIVITY LIMITATIONS: carrying, lifting, bending, standing, squatting, stairs, transfers, bathing, toileting, dressing, locomotion level, and caring for others  PARTICIPATION LIMITATIONS: meal prep, cleaning, laundry, shopping, community activity, occupation, yard work, and church  PERSONAL FACTORS: Age, Past/current experiences, Time since onset of injury/illness/exacerbation, and 3+ comorbidities: HFrEF, atrial fibrillation, VF cardiac arrest s/p ICD, CAD, severe aortic regurgitation s/p AVR 10/2018 and aortic root replacement with CABGx1, aortic dilation, hypertension, hypothyroidism, type 2 diabetes, multiple myeloma, CKD IIIb-IV, migraine  are also affecting patient's functional outcome.   REHAB POTENTIAL: Good  CLINICAL DECISION MAKING: Evolving/moderate complexity  EVALUATION COMPLEXITY: Moderate  PLAN:  PT FREQUENCY: 1x/week  PT DURATION: 6 weeks  PLANNED INTERVENTIONS: 97164- PT Re-evaluation, 97110-Therapeutic exercises, 97530- Therapeutic activity, [id number]- Neuromuscular re-education, 97535- Self Care, [id number]- Manual therapy, [phone]- Gait training, [phone]- Canalith repositioning, [phone]- Aquatic Therapy, Patient/Family education, Balance training, Stair training, Taping, Dry Needling, Joint mobilization, Spinal mobilization, Vestibular training, DME instructions, Cryotherapy, and Moist heat   PLAN FOR NEXT SESSION:  Discharge PT this visit.  Lonia Blood, PT 12/28/23 10:27 AM Phone: [phone] Fax: [phone]  [facility] Rehab at [facility] [address] [place], [address] [place], [place] [address] Phone # [phone] Fax # [phone]

## 2023-12-29 LAB — IGG, IGA, IGM
IgA: 209 mg/dL (ref 61–437)
IgG (Immunoglobin G), Serum: 839 mg/dL (ref 603–1613)
IgM (Immunoglobulin M), Srm: 22 mg/dL (ref 15–143)

## 2023-12-31 ENCOUNTER — Encounter: Payer: Self-pay | Admitting: Internal Medicine

## 2023-12-31 ENCOUNTER — Ambulatory Visit (INDEPENDENT_AMBULATORY_CARE_PROVIDER_SITE_OTHER): Payer: Medicare Other | Admitting: Internal Medicine

## 2023-12-31 VITALS — BP 120/80 | HR 66 | Temp 98.2°F | Ht 68.0 in | Wt 198.0 lb

## 2023-12-31 DIAGNOSIS — Z2821 Immunization not carried out because of patient refusal: Secondary | ICD-10-CM | POA: Diagnosis not present

## 2023-12-31 DIAGNOSIS — E032 Hypothyroidism due to medicaments and other exogenous substances: Secondary | ICD-10-CM | POA: Diagnosis not present

## 2023-12-31 DIAGNOSIS — D6869 Other thrombophilia: Secondary | ICD-10-CM | POA: Diagnosis not present

## 2023-12-31 DIAGNOSIS — I130 Hypertensive heart and chronic kidney disease with heart failure and stage 1 through stage 4 chronic kidney disease, or unspecified chronic kidney disease: Secondary | ICD-10-CM | POA: Diagnosis not present

## 2023-12-31 DIAGNOSIS — E1122 Type 2 diabetes mellitus with diabetic chronic kidney disease: Secondary | ICD-10-CM | POA: Diagnosis not present

## 2023-12-31 DIAGNOSIS — E782 Mixed hyperlipidemia: Secondary | ICD-10-CM | POA: Diagnosis not present

## 2023-12-31 DIAGNOSIS — I480 Paroxysmal atrial fibrillation: Secondary | ICD-10-CM | POA: Diagnosis not present

## 2023-12-31 DIAGNOSIS — I4901 Ventricular fibrillation: Secondary | ICD-10-CM

## 2023-12-31 DIAGNOSIS — Z1211 Encounter for screening for malignant neoplasm of colon: Secondary | ICD-10-CM

## 2023-12-31 DIAGNOSIS — F5102 Adjustment insomnia: Secondary | ICD-10-CM

## 2023-12-31 DIAGNOSIS — I484 Atypical atrial flutter: Secondary | ICD-10-CM

## 2023-12-31 DIAGNOSIS — N1832 Chronic kidney disease, stage 3b: Secondary | ICD-10-CM

## 2023-12-31 DIAGNOSIS — I5042 Chronic combined systolic (congestive) and diastolic (congestive) heart failure: Secondary | ICD-10-CM | POA: Diagnosis not present

## 2023-12-31 DIAGNOSIS — Z95811 Presence of heart assist device: Secondary | ICD-10-CM | POA: Insufficient documentation

## 2023-12-31 LAB — PROTEIN ELECTROPHORESIS, SERUM, WITH REFLEX
A/G Ratio: 1.3 (ref 0.7–1.7)
Albumin ELP: 3.2 g/dL (ref 2.9–4.4)
Alpha-1-Globulin: 0.3 g/dL (ref 0.0–0.4)
Alpha-2-Globulin: 0.5 g/dL (ref 0.4–1.0)
Beta Globulin: 1 g/dL (ref 0.7–1.3)
Gamma Globulin: 0.6 g/dL (ref 0.4–1.8)
Globulin, Total: 2.4 g/dL (ref 2.2–3.9)
Total Protein ELP: 5.6 g/dL — ABNORMAL LOW (ref 6.0–8.5)

## 2023-12-31 LAB — KAPPA/LAMBDA LIGHT CHAINS
Kappa free light chain: 32 mg/L — ABNORMAL HIGH (ref 3.3–19.4)
Kappa, lambda light chain ratio: 1.04 (ref 0.26–1.65)
Lambda free light chains: 30.9 mg/L — ABNORMAL HIGH (ref 5.7–26.3)

## 2023-12-31 MED ORDER — TEMAZEPAM 15 MG PO CAPS: 15.0000 mg | ORAL_CAPSULE | Freq: Every evening | ORAL | 1 refills | Status: DC | PRN

## 2023-12-31 NOTE — Patient Instructions (Signed)

## 2023-12-31 NOTE — Progress Notes (Addendum)
 I,Jameka J Llittleton, CMA,acting as a Neurosurgeon for Smiley Dung, MD.,have documented all relevant documentation on the behalf of Smiley Dung, MD,as directed by  Smiley Dung, MD while in the presence of Smiley Dung, MD.  Subjective:  Patient ID: Eric [name]., MD , male    DOB: [birth year] , 73 y.o.   MRN: [id number]  Chief Complaint  Patient presents with   Hypertension    Patient presents today for a blood pressure check. Patient reports compliance with his meds. Patient denies having chest pains,sob or headaches at this time.    Diabetes    HPI  Discussed the use of AI scribe software for clinical note transcription with the patient, who gave verbal consent to proceed.  History of Present Illness The patient presents for DM/BP check.  He reports compliance with meds. .  He has been experiencing trouble sleeping, which he attributes to recent life changes, including the passing of his wife. Initially, he did not have sleep issues, possibly due to exhaustion and focus on planning a [employer]. However, he now finds himself waking up after a couple of hours of sleep. He is considering trying melatonin, which his son ordered and is expected to arrive soon.  He has a history of chronic kidney disease, currently at stage 3B, and is under the care of a nephrologist. Recent blood work was conducted, and he has been advised to have his A1c checked. He takes several medications, including allopurinol, amiodarone, Eliquis, carvedilol, Farxiga, Lasix, and levothyroxine. He takes allopurinol daily as per his nephrologist's advice and potassium about three times a week, not every time he takes Lasix.  He has been diagnosed with atypical atrial flutter and is on medications including amiodarone and Eliquis, which are appropriate for managing this condition.   He is due for a colonoscopy, with the last one performed in 2014, which showed internal hemorrhoids and a biopsy of a slight  prominence of the ileocecal valve.      Hypertension This is a chronic problem. The current episode started more than 1 year ago. The problem has been gradually improving since onset. The problem is controlled. Pertinent negatives include no blurred vision, chest pain, palpitations or shortness of breath. Risk factors for coronary artery disease include diabetes mellitus, dyslipidemia, sedentary lifestyle, obesity, male gender and stress. Past treatments include beta blockers and diuretics. The current treatment provides moderate improvement. Hypertensive end-organ damage includes kidney disease. There is no history of CVA.  Diabetes He presents for his follow-up diabetic visit. He has type 2 diabetes mellitus. There are no hypoglycemic associated symptoms. Pertinent negatives for diabetes include no blurred vision, no chest pain, no polydipsia, no polyphagia and no polyuria. There are no hypoglycemic complications. There are no diabetic complications. Pertinent negatives for diabetic complications include no CVA. Risk factors for coronary artery disease include obesity and sedentary lifestyle. Current diabetic treatment includes oral agent (dual therapy). He is following a diabetic diet. Meal planning includes avoidance of concentrated sweets. He participates in exercise intermittently. His breakfast blood glucose is taken between 8-9 am. His breakfast blood glucose range is generally 110-130 mg/dl. An ACE inhibitor/angiotensin II receptor blocker is not being taken. He sees a podiatrist.Eye exam is not current.  Hyperlipidemia This is a chronic problem. The current episode started more than 1 year ago. Exacerbating diseases include diabetes and hypothyroidism. He has no history of obesity. Pertinent negatives include no chest pain or shortness of breath. Current antihyperlipidemic treatment includes statins.  Past Medical History:  Diagnosis Date   Acute blood loss anemia    Acute  encephalopathy    Acute hypoxemic respiratory failure (HCC)    Acute idiopathic gout of right ankle    Acute lumbar back pain    Acute on chronic systolic heart failure (HCC)    Acute respiratory failure (HCC)    AKI (acute kidney injury) (HCC)    Altered mental status    Aortic aneurysm without rupture (HCC) 02/09/2016   Aortic root enlargement (HCC) 02/13/2012   Aortic valve regurgitation, acquired 02/13/2012   Arrhythmia    Atrial flutter (HCC) 03/18/2012   Back pain    Cardiac arrest (HCC) 02/01/2016   CHF (congestive heart failure) (HCC)    Chronic anticoagulation 03/04/2013   Chronic combined systolic and diastolic heart failure (HCC)    Chronic kidney disease    kidney fx studies increased    Chronic lower back pain    Chronic renal insufficiency    Chronic systolic heart failure (HCC) 02/13/2012   Recent diagnosis 4 / 2013, LVEF 25% by Echo 12/2011  03/2013: Echo at [facility] Conclusions: 1. Left ventricular ejection fraction estimated by 2D at 40-45 percent. 2. Mild concentric left ventricular hypertrophy. 3. Mild left atrial enlargement. 4. Moderate aortic valve regurgitation. 5. The aortic root at the sinus(es) of valsalva is moderately dilated 6. Mild mitral valve regurgitation. 7.   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    Goals of care, counseling/discussion 05/05/2020   HCAP (healthcare-associated pneumonia)    Heart murmur    Hyperlipidemia 02/13/2012   Hypertension    Hypothyroidism    Internal hemorrhoids without mention of complication 04/11/2013   Labile blood  pressure    Left bundle branch block 02/13/2012   Leg weakness, bilateral    Long term (current) use of anticoagulants 08/06/2013   Eliquis therapy    Long term current use of amiodarone 08/07/2016   Lower extremity weakness    Migraine 02/13/2012   "opthalmic"   Multiple myeloma (HCC) 05/05/2020   Non-traumatic rhabdomyolysis    Obesity (BMI 30-39.9) 02/13/2012   Pain    Paroxysmal atrial fibrillation (HCC)    Pneumonia    Retroperitoneal bleed    Right ankle pain    Right knee pain    Severe aortic regurgitation 02/09/2016   Special screening for malignant neoplasms, colon 04/11/2013   Thyroid activity decreased    Tibial pain    Varicose vein of leg    right   Ventricular fibrillation (HCC) 02/09/2016   Weakness of both lower extremities      Family History  Problem Relation Age of Onset   Hypertension Mother    Heart disease Mother    Heart failure Mother    Diabetes Mother    Hypertension Father    Heart disease Father    Heart failure Father      Current Outpatient Medications:    acetaminophen (TYLENOL) 500 MG tablet, Take 1,000 mg by mouth every 4 (four) hours as needed for moderate  pain or headache., Disp: , Rfl:    allopurinol (ZYLOPRIM) 100 MG tablet, Take 1 tablet (100 mg total) by mouth daily., Disp: 90 tablet, Rfl: 2   amiodarone (PACERONE) 100 MG tablet, Take 1 tablet (100 mg total) by mouth daily., Disp: 90 tablet, Rfl: 3   apixaban (ELIQUIS) 5 MG TABS tablet, Take 1 tablet (5 mg total) by mouth 2 (two) times daily., Disp: 180 tablet, Rfl: 1   carvedilol (COREG) 6.25 MG tablet, Take 1 tablet by mouth twice a day, Disp: 180 tablet, Rfl: 1   cholecalciferol (VITAMIN D3) 25 MCG (1000 UNIT) tablet, Take 5,000 Units by mouth daily., Disp: , Rfl:    FARXIGA 10 MG TABS tablet, TAKE 1 TABLET(10 MG) BY MOUTH DAILY BEFORE BREAKFAST, Disp: 30 tablet, Rfl: 5   fluticasone (FLONASE) 50 MCG/ACT nasal spray, Place 2 sprays into both nostrils daily., Disp: 16 g, Rfl:  2   furosemide (LASIX) 80 MG tablet, Alternate one tablet (80mg ) and half tablet (40mg ) every other day. Okay to take whole tablet as needed for fluid retention, edema., Disp: 90 tablet, Rfl: 3   Hypromellose (ARTIFICIAL TEARS OP), Place 1 drop into both eyes daily as needed (dry eyes)., Disp: , Rfl:    levothyroxine (SYNTHROID) 75 MCG tablet, Take 1 tablet (75 mcg total) by mouth daily before breakfast., Disp: 90 tablet, Rfl: 2   loperamide (IMODIUM) 2 MG capsule, Take 2 mg by mouth as needed for diarrhea or loose stools., Disp: , Rfl:    olmesartan (BENICAR) 5 MG tablet, Take 5 mg by mouth daily., Disp: , Rfl:    potassium chloride 20 MEQ/15ML (10%) SOLN, TAKE 15 ML BY MOUTH TWICE DAILY (Patient taking differently: daily.), Disp: 946 mL, Rfl: 1   rosuvastatin (CRESTOR) 20 MG tablet, TAKE 1 TABLET(20 MG) BY MOUTH DAILY, Disp: 90 tablet, Rfl: 2   Semaglutide,0.25 or 0.5MG /DOS, 2 MG/3ML SOPN, Inject 0.5 into the skin once weekly., Disp: 3 mL, Rfl: 2   temazepam (RESTORIL) 15 MG capsule, Take 1 capsule (15 mg total) by mouth at bedtime as needed for sleep., Disp: 30 capsule, Rfl: 1   vitamin B-12 (CYANOCOBALAMIN) 100 MCG tablet, Take 100 mcg by mouth daily., Disp: , Rfl:    vitamin C (ASCORBIC ACID) 500 MG tablet, Take 500 mg by mouth daily., Disp: , Rfl:    Allergies  Allergen Reactions   Lactose Diarrhea   Lactose Intolerance (Gi) Diarrhea     Review of Systems  Constitutional: Negative.   Eyes:  Negative for blurred vision.  Respiratory: Negative.  Negative for shortness of breath.   Cardiovascular: Negative.  Negative for chest pain and palpitations.  Gastrointestinal: Negative.   Endocrine: Negative for polydipsia, polyphagia and polyuria.  Neurological: Negative.   Psychiatric/Behavioral: Negative.       Today's Vitals   12/31/23 1548  BP: 120/80  Pulse: 66  Temp: 98.2 F (36.8 C)  TempSrc: Oral  Weight: 198 lb (89.8 kg)  Height: 5\' 8"  (1.727 m)  PainSc: 0-No pain   Body  mass index is 30.11 kg/m.  Wt Readings from Last 3 Encounters:  12/31/23 198 lb (89.8 kg)  12/28/23 196 lb (88.9 kg)  10/26/23 196 lb (88.9 kg)    The ASCVD Risk score (Arnett DK, et al., 2019) failed to calculate for the following reasons:   Risk score cannot be calculated because patient has a medical history suggesting prior/existing ASCVD  Objective:  Physical Exam Vitals and nursing note reviewed.  Constitutional:  Appearance: Normal appearance.  HENT:     Head: Normocephalic and atraumatic.  Eyes:     Extraocular Movements: Extraocular movements intact.  Cardiovascular:     Rate and Rhythm: Normal rate and regular rhythm.     Heart sounds: Murmur heard.  Pulmonary:     Effort: Pulmonary effort is normal.     Breath sounds: Normal breath sounds.  Skin:    General: Skin is warm.  Neurological:     General: No focal deficit present.     Mental Status: He is alert.  Psychiatric:        Mood and Affect: Mood normal.         Assessment And Plan:  Type 2 diabetes mellitus with stage 3b chronic kidney disease, without long-term current use of insulin (HCC) Assessment & Plan: Chronic, he wil continue with Farxiga 10mg  daily and Ozempic 0.25mg  weekly. Will adjust Ozempic as needed. He is aware of cardiac/renal benefits of both medications. Nephrologist adjusted medications to improve renal function. Discussed statin switch due to CKD stage. - Continue allopurinol daily. - Start olmesartan as prescribed by Renal - Consult nephrologist Dr. Allena Katz regarding switching rosuvastatin to atorvastatin. - Reminded to avoid NSAIDS, stay well hydrated and kep BP/BS controlled to decrease risk of CKD progression.   Orders: -     Microalbumin / creatinine urine ratio -     Hemoglobin A1c  Hypertensive heart and renal disease with renal failure, stage 1 through stage 4 or unspecified chronic kidney disease, with heart failure (HCC) Assessment & Plan: Chronic, well controlled.  He  will continue with carvedilol 6.25mg  twice daily and furosemide. He is now on olmesartan 5mg  daily as per Renal. He is no longer on Bidil due to lightheadedness/dizziness.     PAF (paroxysmal atrial fibrillation) (HCC) Assessment & Plan: Chronic, currently managed with amiodarone and Eliquis. - Continue amiodarone and Eliquis.   Chronic combined systolic and diastolic CHF (congestive heart failure) (HCC) Assessment & Plan: Chronic, appears euvolemic.  Most recent echo performed at [facility] 4/24 - EF improved to 40-45%. Importance of dietary/medication compliance was discussed with the patient. He will continue with carvedilol 6.25mg  twice daily, and furosemide 80mg  alternating with 40mg  daily. Per Cardiology, he is no longer on Bidil due to dizziness.   Mixed hyperlipidemia Assessment & Plan: Chronic, LDL goal is less than 70.  He will continue with rosuvastatin 20mg  daily for now. However, due to existing renal function, need to consider switching to atorvastatin. He is encouraged to follow a heart healthy lifestyle.    Adjustment insomnia Assessment & Plan: Difficulty sleeping due to stress from recent life changes. Discussed non-pharmacological interventions and melatonin as first-line treatment. - Establish bedtime routine, e.g., reading 10-15 minutes before bed. - Avoid electronic devices before bed. - Consider melatonin before prescription medication.   Hypothyroidism due to medication Assessment & Plan: Hypothyroidism as a result of amiodarone use.  -I will check thyroid panel and adjust meds as needed -He is currently taking levothyroxine daily.  -He will rto in 4-6 months for re-evaluation.   Orders: -     TSH + free T4  Acquired thrombophilia (HCC) Assessment & Plan: Chronic, currently on Eliquis due to underlying PAF.   Screen for colon cancer -     Ambulatory referral to Gastroenterology  Other orders -     Temazepam; Take 1 capsule (15 mg total) by mouth  at bedtime as needed for sleep.  Dispense: 30 capsule; Refill: 1  Return in about 4 months (around 05/01/2024) for bp and dm check.  Patient was given opportunity to ask questions. Patient verbalized understanding of the plan and was able to repeat key elements of the plan. All questions were answered to their satisfaction.    I, Smiley Dung, MD, have reviewed all documentation for this visit. The documentation on 12/31/23 for the exam, diagnosis, procedures, and orders are all accurate and complete.   IF YOU HAVE BEEN REFERRED TO A SPECIALIST, IT MAY TAKE 1-2 WEEKS TO SCHEDULE/PROCESS THE REFERRAL. IF YOU HAVE NOT HEARD FROM US /SPECIALIST IN TWO WEEKS, PLEASE GIVE US  A CALL AT [phone].

## 2024-01-01 DIAGNOSIS — F5102 Adjustment insomnia: Secondary | ICD-10-CM | POA: Insufficient documentation

## 2024-01-01 LAB — TSH+FREE T4
Free T4: 1.72 ng/dL (ref 0.82–1.77)
TSH: 4.83 u[IU]/mL — ABNORMAL HIGH (ref 0.450–4.500)

## 2024-01-01 LAB — HEMOGLOBIN A1C
Est. average glucose Bld gHb Est-mCnc: 128 mg/dL
Hgb A1c MFr Bld: 6.1 % — ABNORMAL HIGH (ref 4.8–5.6)

## 2024-01-01 LAB — MICROALBUMIN / CREATININE URINE RATIO
Creatinine, Urine: 125.4 mg/dL
Microalb/Creat Ratio: 2463 mg/g{creat} — ABNORMAL HIGH (ref 0–29)
Microalbumin, Urine: 3088.4 ug/mL

## 2024-01-01 NOTE — Assessment & Plan Note (Signed)
 Difficulty sleeping due to stress from recent life changes. Discussed non-pharmacological interventions and melatonin as first-line treatment. - Establish bedtime routine, e.g., reading 10-15 minutes before bed. - Avoid electronic devices before bed. - Consider melatonin before prescription medication.

## 2024-01-01 NOTE — Assessment & Plan Note (Signed)
Chronic, LDL goal is less than 70.  He will continue with rosuvastatin 20mg  daily for now. However, due to existing renal function, need to consider switching to atorvastatin. He is encouraged to follow a heart healthy lifestyle.

## 2024-01-01 NOTE — Assessment & Plan Note (Signed)
 Chronic, currently managed with amiodarone and Eliquis. - Continue amiodarone and Eliquis.

## 2024-01-01 NOTE — Assessment & Plan Note (Signed)
 Chronic, he wil continue with [place] 10mg  daily and Ozempic 0.25mg  weekly. Will adjust Ozempic as needed. He is aware of cardiac/renal benefits of both medications. Nephrologist adjusted medications to improve renal function. Discussed statin switch due to CKD stage. - Continue allopurinol daily. - Start olmesartan as prescribed by Renal - Consult nephrologist Dr. Allena Katz regarding switching rosuvastatin to atorvastatin. - Reminded to avoid NSAIDS, stay well hydrated and kep BP/BS controlled to decrease risk of CKD progression.

## 2024-01-01 NOTE — Assessment & Plan Note (Signed)
 Chronic, well controlled.  He will continue with carvedilol 6.25mg  twice daily and furosemide. He is now on olmesartan 5mg  daily as per Renal. He is no longer on Bidil due to lightheadedness/dizziness.

## 2024-01-01 NOTE — Assessment & Plan Note (Signed)
 Chronic, currently on Eliquis due to underlying PAF.

## 2024-01-01 NOTE — Assessment & Plan Note (Signed)
 Hypothyroidism as a result of amiodarone use.  -I will check thyroid panel and adjust meds as needed -He is currently taking levothyroxine daily.  -He will rto in 4-6 months for re-evaluation.

## 2024-01-07 NOTE — Assessment & Plan Note (Signed)
 Chronic, appears euvolemic.  Most recent echo performed at [facility] 4/24 - EF improved to 40-45%. Importance of dietary/medication compliance was discussed with the patient. He will continue with carvedilol 6.25mg  twice daily, and furosemide 80mg  alternating with 40mg  daily. Per Cardiology, he is no longer on Bidil due to dizziness.

## 2024-01-09 ENCOUNTER — Other Ambulatory Visit: Payer: Self-pay | Admitting: Cardiovascular Disease

## 2024-01-14 ENCOUNTER — Other Ambulatory Visit: Payer: Self-pay | Admitting: Cardiovascular Disease

## 2024-01-18 ENCOUNTER — Other Ambulatory Visit: Payer: Self-pay | Admitting: Internal Medicine

## 2024-01-18 DIAGNOSIS — I480 Paroxysmal atrial fibrillation: Secondary | ICD-10-CM

## 2024-01-25 ENCOUNTER — Encounter: Payer: Self-pay | Admitting: Podiatry

## 2024-01-25 ENCOUNTER — Ambulatory Visit (INDEPENDENT_AMBULATORY_CARE_PROVIDER_SITE_OTHER): Payer: Medicare Other | Admitting: Podiatry

## 2024-01-25 DIAGNOSIS — B351 Tinea unguium: Secondary | ICD-10-CM

## 2024-01-25 DIAGNOSIS — L608 Other nail disorders: Secondary | ICD-10-CM | POA: Diagnosis not present

## 2024-01-25 DIAGNOSIS — M79675 Pain in left toe(s): Secondary | ICD-10-CM

## 2024-01-25 DIAGNOSIS — E119 Type 2 diabetes mellitus without complications: Secondary | ICD-10-CM | POA: Diagnosis not present

## 2024-01-25 DIAGNOSIS — M79674 Pain in right toe(s): Secondary | ICD-10-CM | POA: Diagnosis not present

## 2024-01-25 NOTE — Progress Notes (Signed)
 This patient returns to my office for at risk foot care.  This patient requires this care by a professional since this patient will be at risk due to having diabetes type 2, chronic kidney disease and coagulation defect.  Patient is taking eliquisThis patient is unable to cut nails himself since the patient cannot reach his nails.These nails are painful walking and wearing shoes.  This patient presents for at risk foot care today.  General Appearance  Alert, conversant and in no acute stress.  Vascular  Dorsalis pedis and posterior tibial  pulses are palpable  bilaterally.  Capillary return is within normal limits  bilaterally. Temperature is within normal limits  bilaterally.  Neurologic  Senn-Weinstein monofilament wire test within normal limits  bilaterally. Muscle power within normal limits bilaterally.  Nails Thick disfigured discolored nails with subungual debris  from hallux to fifth toes bilaterally. No evidence of bacterial infection or drainage bilaterally.  Orthopedic  No limitations of motion  feet .  No crepitus or effusions noted.  No bony pathology or digital deformities noted.  Skin  normotropic skin with no porokeratosis noted bilaterally.  No signs of infections or ulcers noted.     Onychomycosis  Pain in right toes  Pain in left toes  Consent was obtained for treatment procedures.   Mechanical debridement of nails 1-5  bilaterally performed with a nail nipper.  Filed with dremel without incident.    Return office visit   12 weeks                  Told patient to return for periodic foot care and evaluation due to potential at risk complications.   Ruffin Cotton DPM

## 2024-01-29 NOTE — Addendum Note (Signed)
 Addended by: Lott Rouleau A on: 01/29/2024 11:21 AM   Modules accepted: Orders

## 2024-01-29 NOTE — Progress Notes (Signed)
 Remote ICD transmission.

## 2024-02-04 ENCOUNTER — Other Ambulatory Visit: Payer: Self-pay | Admitting: Internal Medicine

## 2024-02-04 DIAGNOSIS — I130 Hypertensive heart and chronic kidney disease with heart failure and stage 1 through stage 4 chronic kidney disease, or unspecified chronic kidney disease: Secondary | ICD-10-CM

## 2024-02-15 ENCOUNTER — Other Ambulatory Visit: Payer: Self-pay | Admitting: Internal Medicine

## 2024-02-15 DIAGNOSIS — N1832 Chronic kidney disease, stage 3b: Secondary | ICD-10-CM

## 2024-02-19 ENCOUNTER — Other Ambulatory Visit: Payer: Self-pay

## 2024-02-19 ENCOUNTER — Other Ambulatory Visit (HOSPITAL_BASED_OUTPATIENT_CLINIC_OR_DEPARTMENT_OTHER): Payer: Self-pay

## 2024-02-19 DIAGNOSIS — E1122 Type 2 diabetes mellitus with diabetic chronic kidney disease: Secondary | ICD-10-CM

## 2024-02-19 MED ORDER — OZEMPIC (0.25 OR 0.5 MG/DOSE) 2 MG/3ML ~~LOC~~ SOPN
PEN_INJECTOR | SUBCUTANEOUS | 2 refills | Status: DC
  Filled 2024-02-19: qty 3, 28d supply, fill #0

## 2024-02-19 MED ORDER — OZEMPIC (0.25 OR 0.5 MG/DOSE) 2 MG/3ML ~~LOC~~ SOPN
PEN_INJECTOR | SUBCUTANEOUS | 2 refills | Status: DC
  Filled 2024-02-19: qty 3, fill #0

## 2024-02-20 ENCOUNTER — Ambulatory Visit: Payer: Medicare Other

## 2024-02-20 DIAGNOSIS — Z0000 Encounter for general adult medical examination without abnormal findings: Secondary | ICD-10-CM

## 2024-02-20 DIAGNOSIS — Z1211 Encounter for screening for malignant neoplasm of colon: Secondary | ICD-10-CM

## 2024-02-20 NOTE — Patient Instructions (Signed)
 [name] , Thank you for taking time out of your busy schedule to complete your Annual Wellness Visit with me. I enjoyed our conversation and look forward to speaking with you again next year. I, as well as your care team,  appreciate your ongoing commitment to your health goals. Please review the following plan we discussed and let me know if I can assist you in the future. Your Game plan/ To Do List    Referrals: If you haven't heard from the office you've been referred to, please reach out to them at the phone provided.  N/a Follow up Visits: Next Medicare AWV with our clinical staff: office will schedule   Have you seen your provider in the last 6 months (3 months if uncontrolled diabetes)? Yes Next Office Visit with your provider: 05/05/2024 at 4:00  Clinician Recommendations:  Aim for 30 minutes of exercise or brisk walking, 6-8 glasses of water, and 5 servings of fruits and vegetables each day.       This is a list of the screening recommended for you and due dates:  Health Maintenance  Topic Date Due   Pneumonia Vaccine (1 of 2 - PCV) Never done   Colon Cancer Screening  04/12/2023   COVID-19 Vaccine (4 - 2024-25 season) 05/27/2023   Complete foot exam   09/01/2023   Zoster (Shingles) Vaccine (1 of 2) 03/31/2024*   Flu Shot  07/23/2028*   Eye exam for diabetics  04/24/2024   Hemoglobin A1C  07/01/2024   Yearly kidney function blood test for diabetes  12/27/2024   Yearly kidney health urinalysis for diabetes  12/30/2024   Medicare Annual Wellness Visit  02/19/2025   Hepatitis C Screening  Completed   HPV Vaccine  Aged Out   Meningitis B Vaccine  Aged Out   DTaP/Tdap/Td vaccine  Discontinued  *Topic was postponed. The date shown is not the original due date.    Advanced directives: (ACP Link)Information on Advanced Care Planning can be found at [place]  Secretary of [facility] Advance Health Care Directives Advance Health Care Directives. [email or web address]  Advance Care Planning  is important because it:  [x]  Makes sure you receive the medical care that is consistent with your values, goals, and preferences  [x]  It provides guidance to your family and loved ones and reduces their decisional burden about whether or not they are making the right decisions based on your wishes.  Follow the link provided in your after visit summary or read over the paperwork we have mailed to you to help you started getting your Advance Directives in place. If you need assistance in completing these, please reach out to us  so that we can help you!  See attachments for Preventive Care and Fall Prevention Tips.

## 2024-02-20 NOTE — Progress Notes (Signed)
 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  As a reminder, Annual Wellness Visits don't include a physical exam, and some assessments may be limited, especially if this visit is performed virtually. We may recommend an in-person follow-up visit with your provider if needed.  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  Patient Location: Home  Provider Location: Office/Clinic  I discussed the limitations of evaluation and management by telemedicine. The patient expressed understanding and agreed to proceed.  Vital Signs: Because this visit was a virtual/telehealth visit, some criteria may be missing or patient reported. Any vitals not documented were not able to be obtained and vitals that have been documented are patient reported.    Persons Participating in Visit: Patient.  AWV Questionnaire: No: Patient Medicare AWV questionnaire was not completed prior to this visit.  Cardiac Risk Factors include: advanced age (>55men, >33 women);diabetes mellitus;dyslipidemia;hypertension;male gender     Objective:     Today's Vitals   There is no height or weight on file to calculate BMI.     02/20/2024   11:56 AM 12/28/2023    1:34 PM 10/19/2023   12:44 PM 09/12/2023    9:27 AM 08/13/2023   11:56 AM 08/10/2023    9:37 AM 06/29/2023   12:39 PM  Advanced Directives  Does Patient Have a Medical Advance Directive? No No No No No No No  Does patient want to make changes to medical advance directive?  No - Patient declined No - Patient declined    No - Patient declined  Would patient like information on creating a medical advance directive? No - Patient declined  No - Patient declined  No - Patient declined      Current Medications (verified) Outpatient Encounter  Medications as of 02/20/2024  Medication Sig   acetaminophen  (TYLENOL ) 500 MG tablet Take 1,000 mg by mouth every 4 (four) hours as needed for moderate pain or headache.   allopurinol  (ZYLOPRIM ) 100 MG tablet Take 1 tablet (100 mg total) by mouth daily.   amiodarone  (PACERONE ) 100 MG tablet Take 1 tablet (100 mg total) by mouth daily.   carvedilol  (COREG ) 6.25 MG tablet TAKE 1 TABLET BY MOUTH TWICE DAILY   cholecalciferol (VITAMIN D3) 25 MCG (1000 UNIT) tablet Take 5,000 Units by mouth daily.   dapagliflozin  propanediol (FARXIGA ) 10 MG TABS tablet TAKE 1 TABLET(10 MG) BY MOUTH DAILY BEFORE BREAKFAST   ELIQUIS  5 MG TABS tablet TAKE 1 TABLET(5 MG) BY MOUTH TWICE DAILY   fluticasone  (FLONASE ) 50 MCG/ACT nasal spray Place 2 sprays into both nostrils daily.   furosemide  (LASIX ) 80 MG tablet Alternate one tablet (80mg ) and half tablet (40mg ) every other day. Okay to take whole tablet as needed for fluid retention, edema.   Hypromellose (ARTIFICIAL TEARS OP) Place 1 drop into both eyes daily as needed (dry eyes).   levothyroxine  (SYNTHROID ) 75 MCG tablet Take 1 tablet (75 mcg total) by mouth daily before breakfast.   loperamide (IMODIUM) 2 MG capsule Take 2 mg by mouth as needed for diarrhea or loose stools.   olmesartan (BENICAR) 5 MG tablet Take 5 mg by mouth daily.   potassium chloride  20 MEQ/15ML (10%) SOLN TAKE 15 ML BY MOUTH TWICE DAILY   rosuvastatin  (CRESTOR ) 20 MG tablet TAKE 1 TABLET(20 MG)  BY MOUTH DAILY   Semaglutide ,0.25 or 0.5MG /DOS, (OZEMPIC , 0.25 OR 0.5 MG/DOSE,) 2 MG/3ML SOPN INJECT 0.5 MG UNDER THE SKIN ONCE WEEKLY.   temazepam  (RESTORIL ) 15 MG capsule Take 1 capsule (15 mg total) by mouth at bedtime as needed for sleep.   vitamin B-12 (CYANOCOBALAMIN) 100 MCG tablet Take 100 mcg by mouth daily.   vitamin C (ASCORBIC ACID) 500 MG tablet Take 500 mg by mouth daily.   [DISCONTINUED] prochlorperazine  (COMPAZINE ) 10 MG tablet Take 1 tablet (10 mg total) by mouth every 6 (six) hours as  needed (Nausea or vomiting).   No facility-administered encounter medications on file as of 02/20/2024.    Allergies (verified) Lactose and Lactose intolerance (gi)   History: Past Medical History:  Diagnosis Date   Acute blood loss anemia    Acute encephalopathy    Acute hypoxemic respiratory failure (HCC)    Acute idiopathic gout of right ankle    Acute lumbar back pain    Acute on chronic systolic heart failure (HCC)    Acute respiratory failure (HCC)    AKI (acute kidney injury) (HCC)    Altered mental status    Aortic aneurysm without rupture (HCC) 02/09/2016   Aortic root enlargement (HCC) 02/13/2012   Aortic valve regurgitation, acquired 02/13/2012   Arrhythmia    Atrial flutter (HCC) 03/18/2012   Back pain    Cardiac arrest (HCC) 02/01/2016   CHF (congestive heart failure) (HCC)    Chronic anticoagulation 03/04/2013   Chronic combined systolic and diastolic heart failure (HCC)    Chronic kidney disease    kidney fx studies increased    Chronic lower back pain    Chronic renal insufficiency    Chronic systolic heart failure (HCC) 02/13/2012   Recent diagnosis 4 / 2013, LVEF 25% by Echo 12/2011  03/2013: Echo at [facility] Conclusions: 1. Left ventricular ejection fraction estimated by 2D at 40-45 percent. 2. Mild concentric left ventricular hypertrophy. 3. Mild left atrial enlargement. 4. Moderate aortic valve regurgitation. 5. The aortic root at the sinus(es) of valsalva is moderately dilated 6. Mild mitral valve regurgitation. 7.   CKD (chronic kidney disease)    CKD (chronic kidney disease), stage IV (HCC) 08/06/2013   Creatinine 2.4 on 07/04/13    Claustrophobia    Colon cancer screening 03/04/2013   Debility 02/22/2016   Diabetes mellitus type 2 in nonobese [facility])    Diabetes mellitus type 2 in obese    Dysrhythmia    "palpitations"   Encounter for central line placement    Exertional dyspnea 01/2012   Femoral nerve injury 02/22/2016   Femoral neuropathy     Goals of care, counseling/discussion 05/05/2020   HCAP (healthcare-associated pneumonia)    Heart murmur    Hyperlipidemia 02/13/2012   Hypertension    Hypothyroidism    Internal hemorrhoids without mention of complication 04/11/2013   Labile blood pressure    Left bundle branch block 02/13/2012   Leg weakness, bilateral    Long term (current) use of anticoagulants 08/06/2013   Eliquis  therapy    Long term current use of amiodarone  08/07/2016   Lower extremity weakness    Migraine 02/13/2012   "opthalmic"   Multiple myeloma (HCC) 05/05/2020   Non-traumatic rhabdomyolysis    Obesity (BMI 30-39.9) 02/13/2012   Pain    Paroxysmal atrial fibrillation (HCC)    Pneumonia    Retroperitoneal bleed    Right ankle pain    Right knee pain    Severe aortic regurgitation 02/09/2016  Special screening for malignant neoplasms, colon 04/11/2013   Thyroid  activity decreased    Tibial pain    Varicose vein of leg    right   Ventricular fibrillation (HCC) 02/09/2016   Weakness of both lower extremities    Past Surgical History:  Procedure Laterality Date   AORTIC VALVE REPLACEMENT  11/20/2018   CARDIAC CATHETERIZATION N/A 02/17/2016   Procedure: Left Heart Cath and Coronary Angiography;  Surgeon: Lucendia Rusk, MD;  Location: [facility] INVASIVE CV LAB;  Service: Cardiovascular;  Laterality: N/A;   CARDIOVERSION  03/22/2012   Procedure: CARDIOVERSION;  Surgeon: Dorothye Gathers, MD;  Location: [facility] ENDOSCOPY;  Service: Cardiovascular;  Laterality: N/A;   CARDIOVERSION  04/19/2012   Procedure: CARDIOVERSION;  Surgeon: Mickiel Albany, MD;  Location: [facility] OR;  Service: Cardiovascular;  Laterality: N/A;   COLONOSCOPY N/A 04/11/2013   Procedure: COLONOSCOPY;  Surgeon: Claudette Cue, MD;  Location: [facility] ENDOSCOPY;  Service: Endoscopy;  Laterality: N/A;   COLONOSCOPY N/A 04/11/2013   Procedure: COLONOSCOPY;  Surgeon: Claudette Cue, MD;  Location: [facility] ENDOSCOPY;  Service: Endoscopy;  Laterality: N/A;    EP IMPLANTABLE DEVICE N/A 02/17/2016   Procedure: BiV ICD Insertion CRT-D;  Surgeon: Tammie Fall, MD;  Location: [facility] INVASIVE CV LAB;  Service: Cardiovascular;  Laterality: N/A;   FINGER SURGERY  2012   "4th digit right hand; thumb on left hand"   RADIOLOGY WITH ANESTHESIA N/A 02/11/2016   Procedure: MRI OF THE BRAIN WITHOUT CONTRAST, LUMBAR WITHOUT CONTRAST;  Surgeon: Medication Radiologist, MD;  Location: [facility] OR;  Service: Radiology;  Laterality: N/A;  DR. WOOD/MRI   RIGHT/LEFT HEART CATH AND CORONARY ANGIOGRAPHY N/A 07/26/2018   Procedure: RIGHT/LEFT HEART CATH AND CORONARY ANGIOGRAPHY;  Surgeon: Arty Binning, MD;  Location: [facility] INVASIVE CV LAB;  Service: Cardiovascular;  Laterality: N/A;   Skin melanocytoma excision  2012   "above left clavicle"   STERNAL INCISION RECLOSURE  11/2018   STERNAL WIRE REMOVAL  11/2018   STERNAL WOUND DEBRIDEMENT  11/2018   TEE WITHOUT CARDIOVERSION  03/22/2012   Procedure: TRANSESOPHAGEAL ECHOCARDIOGRAM (TEE);  Surgeon: Dorothye Gathers, MD;  Location: [facility] ENDOSCOPY;  Service: Cardiovascular;  Laterality: N/A;   TEE WITHOUT CARDIOVERSION N/A 02/08/2016   Procedure: TRANSESOPHAGEAL ECHOCARDIOGRAM (TEE);  Surgeon: Lenise Quince, MD;  Location: [facility] ENDOSCOPY;  Service: Cardiovascular;  Laterality: N/A;   Family History  Problem Relation Age of Onset   Hypertension Mother    Heart disease Mother    Heart failure Mother    Diabetes Mother    Hypertension Father    Heart disease Father    Heart failure Father    Social History   Socioeconomic History   Marital status: Married    Spouse name: Eric [name]   Number of children: 1   Years of education: Not on file   Highest education level: Not on file  Occupational History    Employer: [employer]  Tobacco Use   Smoking status: Never   Smokeless tobacco: Never  Vaping Use   Vaping status: Never Used  Substance and Sexual Activity   Alcohol use: Not Currently    Comment: rarely   Drug use: No    Sexual activity: Not Currently  Other Topics Concern   Not on file  Social History Narrative   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 He has one son.   Social Drivers of Health   Financial Resource Strain: Low Risk  (02/20/2024)  Overall Financial Resource Strain (CARDIA)    Difficulty of Paying Living Expenses: Not hard at all  Food Insecurity: No Food Insecurity (02/20/2024)   Hunger Vital Sign    Worried About Running Out of Food in the Last Year: Never true    Ran Out of Food in the Last Year: Never true  Transportation Needs: No Transportation Needs (02/20/2024)   PRAPARE - Administrator, Civil Service (Medical): No    Lack of Transportation (Non-Medical): No  Physical Activity: Insufficiently Active (02/20/2024)   Exercise Vital Sign    Days of Exercise per Week: 2 days    Minutes of Exercise per Session: 30 min  Stress: Stress Concern Present (02/20/2024)   [employer] of Occupational Health - Occupational Stress Questionnaire    Feeling of Stress : To some extent  Social Connections: Moderately Integrated (02/20/2024)   Social Connection and Isolation Panel [NHANES]    Frequency of Communication with Friends and Family: More than three times a week    Frequency of Social Gatherings with Friends and Family: Once a week    Attends Religious Services: 1 to 4 times per year    Active Member of [employer] or Organizations: Yes    Attends Banker Meetings: More than 4 times per year    Marital Status: Widowed    Tobacco Counseling Counseling given: Not Answered    Clinical Intake:  Pre-visit preparation completed: Yes  Pain : No/denies pain     Nutritional Risks: None Diabetes: Yes CBG done?: No Did pt. bring in CBG monitor from home?: No  Lab Results  Component Value Date   HGBA1C 6.1 (H) 12/31/2023   HGBA1C 5.9 (H) 08/31/2023   HGBA1C 6.2 (H) 05/04/2023     How often do you need to have someone help you when  you read instructions, pamphlets, or other written materials from your doctor or pharmacy?: 1 - Never  Interpreter Needed?: No  Information entered by :: NAllen LPN   Activities of Daily Living     02/20/2024   11:50 AM  In your present state of health, do you have any difficulty performing the following activities:  Hearing? 0  Vision? 0  Difficulty concentrating or making decisions? 0  Walking or climbing stairs? 1  Dressing or bathing? 0  Doing errands, shopping? 0  Preparing Food and eating ? N  Using the Toilet? N  In the past six months, have you accidently leaked urine? N  Do you have problems with loss of bowel control? N  Managing your Medications? N  Managing your Finances? N  Housekeeping or managing your Housekeeping? N    Patient Care Team: Cleave Curling, MD as PCP - General (Internal Medicine) Tammie Fall, MD as PCP - Electrophysiology (Cardiology) Maudine Sos, MD as PCP - Cardiology (Cardiology) Arty Binning, MD (Inactive) as Consulting Physician (Cardiology) Ivor Mars, MD as Medical Oncologist (Oncology)  Indicate any recent Medical Services you may have received from other than [facility] providers in the past year (date may be approximate).     Assessment:    This is a routine wellness examination for [place].  Hearing/Vision screen Hearing Screening - Comments:: Denies hearing issuses Vision Screening - Comments:: Regular eye exams,    Goals Addressed             This Visit's Progress    Patient Stated       02/20/2024, wants to improve muscle tone  Depression Screen     02/20/2024   11:57 AM 08/31/2023    9:02 AM 05/04/2023    3:49 PM 01/29/2023    4:21 PM 08/31/2022    8:45 AM 05/31/2022    9:04 AM 05/25/2021    9:26 AM  PHQ 2/9 Scores  PHQ - 2 Score 1 0 0 0 0 0 0  PHQ- 9 Score  0 0 0       Fall Risk     02/20/2024   11:56 AM 05/04/2023    3:49 PM 01/29/2023    4:23 PM 01/29/2023    4:21 PM 08/31/2022    8:45 AM  Fall  Risk   Falls in the past year? 0 0 0 0 0  Number falls in past yr: 0 0 0 0 0  Injury with Fall? 0 0 0 0 0  Risk for fall due to : Medication side effect No Fall Risks No Fall Risks No Fall Risks No Fall Risks  Follow up Falls evaluation completed;Falls prevention discussed Falls evaluation completed Falls evaluation completed Falls evaluation completed Falls evaluation completed    MEDICARE RISK AT HOME:  Medicare Risk at Home Any stairs in or around the home?: Yes If so, are there any without handrails?: No Home free of loose throw rugs in walkways, pet beds, electrical cords, etc?: Yes Adequate lighting in your home to reduce risk of falls?: Yes Life alert?: No Use of a cane, walker or w/c?: No Grab bars in the bathroom?: Yes Shower chair or bench in shower?: Yes Elevated toilet seat or a handicapped toilet?: Yes  TIMED UP AND GO:  Was the test performed?  No  Cognitive Function: 6CIT completed        02/20/2024   11:58 AM 05/31/2022    9:06 AM 05/25/2021    9:28 AM 04/22/2020    5:09 PM 04/09/2019    3:22 PM  6CIT Screen  What Year? 0 points 0 points 0 points 0 points 0 points  What month? 0 points 0 points 0 points 0 points 0 points  What time? 0 points 0 points 0 points 0 points 0 points  Count back from 20 0 points 0 points 0 points 0 points 0 points  Months in reverse 0 points 0 points 0 points 0 points 2 points  Repeat phrase 2 points 0 points 4 points 4 points 0 points  Total Score 2 points 0 points 4 points 4 points 2 points    Immunizations Immunization History  Administered Date(s) Administered   Influenza, High Dose Seasonal PF 07/01/2019   Influenza,inj,Quad PF,6+ Mos 06/25/2020   Influenza-Unspecified 04/25/2018   Moderna Sars-Covid-2 Vaccination 08/23/2020   PFIZER(Purple Top)SARS-COV-2 Vaccination 10/09/2019, 10/30/2019    Screening Tests Health Maintenance  Topic Date Due   Pneumonia Vaccine 8+ Years old (1 of 2 - PCV) Never done   Colonoscopy   04/12/2023   COVID-19 Vaccine (4 - 2024-25 season) 05/27/2023   FOOT EXAM  09/01/2023   Zoster Vaccines- Shingrix (1 of 2) 03/31/2024 (Originally 12/06/1969)   INFLUENZA VACCINE  07/23/2028 (Originally 04/25/2024)   OPHTHALMOLOGY EXAM  04/24/2024   HEMOGLOBIN A1C  07/01/2024   Diabetic kidney evaluation - eGFR measurement  12/27/2024   Diabetic kidney evaluation - Urine ACR  12/30/2024   Medicare Annual Wellness (AWV)  02/19/2025   Hepatitis C Screening  Completed   HPV VACCINES  Aged Out   Meningococcal B Vaccine  Aged Out   DTaP/Tdap/Td  Discontinued  Health Maintenance  Health Maintenance Due  Topic Date Due   Pneumonia Vaccine 83+ Years old (1 of 2 - PCV) Never done   Colonoscopy  04/12/2023   COVID-19 Vaccine (4 - 2024-25 season) 05/27/2023   FOOT EXAM  09/01/2023   Health Maintenance Items Addressed: Referral sent to GI for colonoscopy. Due for covid and pneumonia vaccine. Foot exam will be addressed at 05/05/2024 appointment.  Additional Screening:  Vision Screening: Recommended annual ophthalmology exams for early detection of glaucoma and other disorders of the eye.  Dental Screening: Recommended annual dental exams for proper oral hygiene  Community Resource Referral / Chronic Care Management: CRR required this visit?  No   CCM required this visit?  No   Plan:    I have personally reviewed and noted the following in the patient's chart:   Medical and social history Use of alcohol, tobacco or illicit drugs  Current medications and supplements including opioid prescriptions. Patient is not currently taking opioid prescriptions. Functional ability and status Nutritional status Physical activity Advanced directives List of other physicians Hospitalizations, surgeries, and ER visits in previous 12 months Vitals Screenings to include cognitive, depression, and falls Referrals and appointments  In addition, I have reviewed and discussed with patient certain  preventive protocols, quality metrics, and best practice recommendations. A written personalized care plan for preventive services as well as general preventive health recommendations were provided to patient.   Areatha Beecham, LPN   06/08/7828   After Visit Summary: (MyChart) Due to this being a telephonic visit, the after visit summary with patients personalized plan was offered to patient via MyChart   Notes: Nothing significant to report at this time.

## 2024-02-22 ENCOUNTER — Inpatient Hospital Stay

## 2024-02-22 ENCOUNTER — Other Ambulatory Visit: Payer: Self-pay

## 2024-02-22 ENCOUNTER — Inpatient Hospital Stay: Attending: Hematology & Oncology

## 2024-02-22 ENCOUNTER — Inpatient Hospital Stay: Admitting: Hematology & Oncology

## 2024-02-22 ENCOUNTER — Encounter: Payer: Self-pay | Admitting: Hematology & Oncology

## 2024-02-22 DIAGNOSIS — M7989 Other specified soft tissue disorders: Secondary | ICD-10-CM | POA: Insufficient documentation

## 2024-02-22 DIAGNOSIS — Z79899 Other long term (current) drug therapy: Secondary | ICD-10-CM | POA: Insufficient documentation

## 2024-02-22 DIAGNOSIS — C9000 Multiple myeloma not having achieved remission: Secondary | ICD-10-CM | POA: Diagnosis not present

## 2024-02-22 LAB — CMP (CANCER CENTER ONLY)
ALT: 22 U/L (ref 0–44)
AST: 25 U/L (ref 15–41)
Albumin: 4.1 g/dL (ref 3.5–5.0)
Alkaline Phosphatase: 96 U/L (ref 38–126)
Anion gap: 9 (ref 5–15)
BUN: 40 mg/dL — ABNORMAL HIGH (ref 8–23)
CO2: 25 mmol/L (ref 22–32)
Calcium: 8.8 mg/dL — ABNORMAL LOW (ref 8.9–10.3)
Chloride: 108 mmol/L (ref 98–111)
Creatinine: 2.34 mg/dL — ABNORMAL HIGH (ref 0.61–1.24)
GFR, Estimated: 29 mL/min — ABNORMAL LOW (ref >60)
Glucose, Bld: 102 mg/dL — ABNORMAL HIGH (ref 70–99)
Potassium: 4.1 mmol/L (ref 3.5–5.1)
Sodium: 142 mmol/L (ref 135–145)
Total Bilirubin: 1.2 mg/dL (ref 0.0–1.2)
Total Protein: 6.7 g/dL (ref 6.5–8.1)

## 2024-02-22 LAB — CBC WITH DIFFERENTIAL (CANCER CENTER ONLY)
Abs Immature Granulocytes: 0.01 10*3/uL (ref 0.00–0.07)
Basophils Absolute: 0 10*3/uL (ref 0.0–0.1)
Basophils Relative: 1 %
Eosinophils Absolute: 0.3 10*3/uL (ref 0.0–0.5)
Eosinophils Relative: 5 %
HCT: 38.9 % — ABNORMAL LOW (ref 39.0–52.0)
Hemoglobin: 12.5 g/dL — ABNORMAL LOW (ref 13.0–17.0)
Immature Granulocytes: 0 %
Lymphocytes Relative: 12 %
Lymphs Abs: 0.6 10*3/uL — ABNORMAL LOW (ref 0.7–4.0)
MCH: 29.7 pg (ref 26.0–34.0)
MCHC: 32.1 g/dL (ref 30.0–36.0)
MCV: 92.4 fL (ref 80.0–100.0)
Monocytes Absolute: 0.7 10*3/uL (ref 0.1–1.0)
Monocytes Relative: 14 %
Neutro Abs: 3.5 10*3/uL (ref 1.7–7.7)
Neutrophils Relative %: 68 %
Platelet Count: 131 10*3/uL — ABNORMAL LOW (ref 150–400)
RBC: 4.21 MIL/uL — ABNORMAL LOW (ref 4.22–5.81)
RDW: 17.2 % — ABNORMAL HIGH (ref 11.5–15.5)
WBC Count: 5.2 10*3/uL (ref 4.0–10.5)
nRBC: 0 % (ref 0.0–0.2)

## 2024-02-22 LAB — LACTATE DEHYDROGENASE: LDH: 203 U/L — ABNORMAL HIGH (ref 98–192)

## 2024-02-22 NOTE — Progress Notes (Signed)
 Hematology and Oncology Follow Up Visit  Eric Esquivias., MD [id number] [birth year] 73 y.o. 02/22/2024   Principle Diagnosis:  IgG kappa myeloma  - 1q+   Current Therapy:        CyBorD -- s/p cycle  #19 - started on 06/04/2020 --last treatment in 08/2022 --on hold for now.   Interim History:  [name] is here today for follow-up.  He is managing pretty well.  His wife passed away several months ago.  He is still working.  He works part-time as an Clinical research associate.  He does go to see family.  He has I think his son down in [place].  We have not treated him now for almost a year and a half.  Again his myeloma really has not been much of a problem.  There has been no monoclonal spike in his blood.  When we last saw him, his IgG level was 839 mg/dL.  His kappa light chain was 3.2 mg/dL.    His appetite has been good.  He has had no problems with bowels or bladder.  He has had no fever.  He has had no cough or shortness of breath.  He has had no rashes.  There has been no leg swelling.  He has avoided COVID and Influenza.  Overall, I would say his performance status is probably ECOG 1.    Medications:  Allergies as of 02/22/2024       Reactions   Lactose Diarrhea   Lactose Intolerance (gi) Diarrhea        Medication List        Accurate as of Feb 22, 2024 12:40 PM. If you have any questions, ask your nurse or doctor.          acetaminophen  500 MG tablet Commonly known as: TYLENOL  Take 1,000 mg by mouth every 4 (four) hours as needed for moderate pain or headache.   allopurinol  100 MG tablet Commonly known as: Zyloprim  Take 1 tablet (100 mg total) by mouth daily.   amiodarone  100 MG tablet Commonly known as: PACERONE  Take 1 tablet (100 mg total) by mouth daily.   ARTIFICIAL TEARS OP Place 1 drop into both eyes daily as needed (dry eyes).   ascorbic acid 500 MG tablet Commonly known as: VITAMIN C Take 500 mg by mouth daily.   carvedilol  6.25 MG  tablet Commonly known as: COREG  TAKE 1 TABLET BY MOUTH TWICE DAILY   cholecalciferol 25 MCG (1000 UNIT) tablet Commonly known as: VITAMIN D3 Take 5,000 Units by mouth daily.   dapagliflozin  propanediol 10 MG Tabs tablet Commonly known as: FARXIGA  TAKE 1 TABLET(10 MG) BY MOUTH DAILY BEFORE BREAKFAST   Eliquis  5 MG Tabs tablet Generic drug: apixaban  TAKE 1 TABLET(5 MG) BY MOUTH TWICE DAILY   fluticasone  50 MCG/ACT nasal spray Commonly known as: FLONASE  Place 2 sprays into both nostrils daily.   furosemide  40 MG tablet Commonly known as: LASIX  Take 40 mg by mouth. Every other day   furosemide  80 MG tablet Commonly known as: LASIX  Alternate one tablet (80mg ) and half tablet (40mg ) every other day. Okay to take whole tablet as needed for fluid retention, edema.   levothyroxine  75 MCG tablet Commonly known as: SYNTHROID  Take 1 tablet (75 mcg total) by mouth daily before breakfast.   loperamide 2 MG capsule Commonly known as: IMODIUM Take 2 mg by mouth as needed for diarrhea or loose stools.   olmesartan 5 MG tablet Commonly known as: BENICAR Take 5 mg by mouth  daily.   Ozempic  (0.25 or 0.5 MG/DOSE) 2 MG/3ML Sopn Generic drug: Semaglutide (0.25 or 0.5MG /DOS) INJECT 0.5 MG UNDER THE SKIN ONCE WEEKLY.   potassium chloride  20 MEQ/15ML (10%) Soln TAKE 15 ML BY MOUTH TWICE DAILY   rosuvastatin  20 MG tablet Commonly known as: CRESTOR  TAKE 1 TABLET(20 MG) BY MOUTH DAILY   temazepam  15 MG capsule Commonly known as: RESTORIL  Take 1 capsule (15 mg total) by mouth at bedtime as needed for sleep.   vitamin B-12 100 MCG tablet Commonly known as: CYANOCOBALAMIN Take 100 mcg by mouth daily.        Allergies:  Allergies  Allergen Reactions   Lactose Diarrhea   Lactose Intolerance (Gi) Diarrhea    Past Medical History, Surgical history, Social history, and Family History were reviewed and updated.  Review of Systems: Review of Systems  Constitutional: Negative.    Eyes: Negative.   Respiratory: Negative.    Cardiovascular:  Positive for leg swelling.  Gastrointestinal: Negative.   Genitourinary: Negative.   Musculoskeletal: Negative.   Skin: Negative.   Neurological: Negative.   Endo/Heme/Allergies: Negative.   Psychiatric/Behavioral: Negative.       Physical Exam:  Temperature is 97.9.  Pulse 69.  Blood pressure 123/90.  Weight is 187 pounds.  Wt Readings from Last 3 Encounters:  12/31/23 198 lb (89.8 kg)  12/28/23 196 lb (88.9 kg)  10/26/23 196 lb (88.9 kg)    Physical Exam Vitals reviewed.  HENT:     Head: Normocephalic and atraumatic.  Eyes:     Pupils: Pupils are equal, round, and reactive to light.  Cardiovascular:     Rate and Rhythm: Normal rate and regular rhythm.     Heart sounds: Normal heart sounds.  Pulmonary:     Effort: Pulmonary effort is normal.     Breath sounds: Normal breath sounds.  Abdominal:     General: Bowel sounds are normal.     Palpations: Abdomen is soft.  Musculoskeletal:        General: No tenderness or deformity. Normal range of motion.     Cervical back: Normal range of motion.  Lymphadenopathy:     Cervical: No cervical adenopathy.  Skin:    General: Skin is warm and dry.     Findings: No erythema or rash.  Neurological:     Mental Status: He is alert and oriented to person, place, and time.  Psychiatric:        Behavior: Behavior normal.        Thought Content: Thought content normal.        Judgment: Judgment normal.    Lab Results  Component Value Date   WBC 5.3 12/28/2023   HGB 11.8 (L) 12/28/2023   HCT 35.0 (L) 12/28/2023   MCV 93.6 12/28/2023   PLT 150 12/28/2023   Lab Results  Component Value Date   FERRITIN 56 09/15/2022   IRON 92 09/15/2022   TIBC 349 09/15/2022   UIBC 257 09/15/2022   IRONPCTSAT 26 09/15/2022   Lab Results  Component Value Date   RETICCTPCT 1.4 09/15/2022   RBC 3.74 (L) 12/28/2023   Lab Results  Component Value Date   KPAFRELGTCHN 32.0  (H) 12/28/2023   LAMBDASER 30.9 (H) 12/28/2023   KAPLAMBRATIO 1.04 12/28/2023   Lab Results  Component Value Date   IGGSERUM 839 12/28/2023   IGA 209 12/28/2023   IGMSERUM 22 12/28/2023   Lab Results  Component Value Date   TOTALPROTELP 5.6 (L) 12/28/2023   ALBUMINELP 3.2  12/28/2023   A1GS 0.3 12/28/2023   A2GS 0.5 12/28/2023   BETS 1.0 12/28/2023   GAMS 0.6 12/28/2023   MSPIKE Not Observed 12/28/2023   SPEI Comment 04/22/2021     Chemistry      Component Value Date/Time   NA 142 02/22/2024 1141   NA 145 (H) 08/31/2023 0946   K 4.1 02/22/2024 1141   CL 108 02/22/2024 1141   CO2 25 02/22/2024 1141   BUN 40 (H) 02/22/2024 1141   BUN 33 (H) 08/31/2023 0946   CREATININE 2.34 (H) 02/22/2024 1141   CREATININE 1.95 (H) 09/08/2016 0859      Component Value Date/Time   CALCIUM  8.8 (L) 02/22/2024 1141   ALKPHOS 96 02/22/2024 1141   AST 25 02/22/2024 1141   ALT 22 02/22/2024 1141   BILITOT 1.2 02/22/2024 1141       Impression and Plan: 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  He has done incredibly well with treatment.  Again, he has been off treatment now for about a year and a half.  His monoclonal studies have all looked pretty stable.  I would not imagine that anything will change right now.  At this point time, we will plan to get him through the Summer.  I would like to see him back after Labor Day.  I am sure that he will be traveling while he is not working.   Ivor Mars, MD 5/30/202512:40 PM

## 2024-02-23 LAB — IGG, IGA, IGM
IgA: 244 mg/dL (ref 61–437)
IgG (Immunoglobin G), Serum: 1066 mg/dL (ref 603–1613)
IgM (Immunoglobulin M), Srm: 29 mg/dL (ref 15–143)

## 2024-02-25 LAB — PROTEIN ELECTROPHORESIS, SERUM, WITH REFLEX
A/G Ratio: 1.2 (ref 0.7–1.7)
Albumin ELP: 3.3 g/dL (ref 2.9–4.4)
Alpha-1-Globulin: 0.3 g/dL (ref 0.0–0.4)
Alpha-2-Globulin: 0.6 g/dL (ref 0.4–1.0)
Beta Globulin: 1.1 g/dL (ref 0.7–1.3)
Gamma Globulin: 0.9 g/dL (ref 0.4–1.8)
Globulin, Total: 2.8 g/dL (ref 2.2–3.9)
Total Protein ELP: 6.1 g/dL (ref 6.0–8.5)

## 2024-02-25 LAB — KAPPA/LAMBDA LIGHT CHAINS
Kappa free light chain: 37.4 mg/L — ABNORMAL HIGH (ref 3.3–19.4)
Kappa, lambda light chain ratio: 1.17 (ref 0.26–1.65)
Lambda free light chains: 31.9 mg/L — ABNORMAL HIGH (ref 5.7–26.3)

## 2024-03-01 ENCOUNTER — Other Ambulatory Visit (HOSPITAL_BASED_OUTPATIENT_CLINIC_OR_DEPARTMENT_OTHER): Payer: Self-pay

## 2024-03-03 ENCOUNTER — Other Ambulatory Visit (HOSPITAL_BASED_OUTPATIENT_CLINIC_OR_DEPARTMENT_OTHER): Payer: Self-pay | Admitting: Family

## 2024-03-03 DIAGNOSIS — I480 Paroxysmal atrial fibrillation: Secondary | ICD-10-CM

## 2024-03-06 ENCOUNTER — Encounter: Payer: Self-pay | Admitting: Internal Medicine

## 2024-03-11 ENCOUNTER — Ambulatory Visit: Payer: Medicare Other

## 2024-03-11 DIAGNOSIS — I428 Other cardiomyopathies: Secondary | ICD-10-CM | POA: Diagnosis not present

## 2024-03-11 LAB — CUP PACEART REMOTE DEVICE CHECK
Battery Remaining Longevity: 5 mo
Battery Remaining Percentage: 6 %
Battery Voltage: 2.65 V
Date Time Interrogation Session: 20250617020021
HighPow Impedance: 45 Ohm
HighPow Impedance: 45 Ohm
Implantable Lead Connection Status: 753985
Implantable Lead Connection Status: 753985
Implantable Lead Connection Status: 753985
Implantable Lead Implant Date: 20170525
Implantable Lead Implant Date: 20170525
Implantable Lead Implant Date: 20170525
Implantable Lead Location: 753858
Implantable Lead Location: 753859
Implantable Lead Location: 753860
Implantable Lead Model: 7122
Implantable Pulse Generator Implant Date: 20170525
Lead Channel Impedance Value: 380 Ohm
Lead Channel Impedance Value: 430 Ohm
Lead Channel Impedance Value: 560 Ohm
Lead Channel Pacing Threshold Amplitude: 0.5 V
Lead Channel Pacing Threshold Amplitude: 1.25 V
Lead Channel Pacing Threshold Pulse Width: 0.5 ms
Lead Channel Pacing Threshold Pulse Width: 0.5 ms
Lead Channel Sensing Intrinsic Amplitude: 0.2 mV
Lead Channel Sensing Intrinsic Amplitude: 11.4 mV
Lead Channel Setting Pacing Amplitude: 2 V
Lead Channel Setting Pacing Amplitude: 2.5 V
Lead Channel Setting Pacing Pulse Width: 0.5 ms
Lead Channel Setting Pacing Pulse Width: 0.5 ms
Lead Channel Setting Sensing Sensitivity: 0.5 mV
Pulse Gen Serial Number: 7357926
Zone Setting Status: 755011

## 2024-03-13 ENCOUNTER — Ambulatory Visit: Payer: Self-pay | Admitting: Internal Medicine

## 2024-04-03 ENCOUNTER — Encounter: Payer: Self-pay | Admitting: Gastroenterology

## 2024-04-04 ENCOUNTER — Encounter (HOSPITAL_BASED_OUTPATIENT_CLINIC_OR_DEPARTMENT_OTHER): Payer: Self-pay | Admitting: Cardiovascular Disease

## 2024-04-04 NOTE — Telephone Encounter (Addendum)
 S/w pt due to pt visibly sees carotid pulsing.  Denies CP and dizziness. Does have occasional SOB with exertion.  Pt recently lost 50 lbs. Pt had device interrogation last month and everything was ok. Made pt appt to see Dr. Raford in November.  If pt has any more concerns will send message or call office. Pt stated had infusion yesterday and with bowel movement today, not hard or soft and brown in color had a spot of blood in feces the size of a nickel. No blood on toilet paper.Advised with Reche Finder, NP to keep eye on this and let office know if this happens again.

## 2024-04-28 ENCOUNTER — Encounter: Payer: Self-pay | Admitting: Podiatry

## 2024-04-28 ENCOUNTER — Ambulatory Visit (INDEPENDENT_AMBULATORY_CARE_PROVIDER_SITE_OTHER): Admitting: Podiatry

## 2024-04-28 DIAGNOSIS — M79674 Pain in right toe(s): Secondary | ICD-10-CM | POA: Diagnosis not present

## 2024-04-28 DIAGNOSIS — B351 Tinea unguium: Secondary | ICD-10-CM

## 2024-04-28 DIAGNOSIS — M79675 Pain in left toe(s): Secondary | ICD-10-CM | POA: Diagnosis not present

## 2024-04-28 DIAGNOSIS — E119 Type 2 diabetes mellitus without complications: Secondary | ICD-10-CM | POA: Diagnosis not present

## 2024-04-28 DIAGNOSIS — L608 Other nail disorders: Secondary | ICD-10-CM | POA: Diagnosis not present

## 2024-04-28 NOTE — Progress Notes (Signed)
 This patient returns to my office for at risk foot care.  This patient requires this care by a professional since this patient will be at risk due to having diabetes type 2, chronic kidney disease and coagulation defect.  Patient is taking eliquisThis patient is unable to cut nails himself since the patient cannot reach his nails.These nails are painful walking and wearing shoes.  This patient presents for at risk foot care today.  General Appearance  Alert, conversant and in no acute stress.  Vascular  Dorsalis pedis and posterior tibial  pulses are palpable  bilaterally.  Capillary return is within normal limits  bilaterally. Temperature is within normal limits  bilaterally.  Neurologic  Senn-Weinstein monofilament wire test within normal limits  bilaterally. Muscle power within normal limits bilaterally.  Nails Thick disfigured discolored nails with subungual debris  from hallux to fifth toes bilaterally. No evidence of bacterial infection or drainage bilaterally.  Orthopedic  No limitations of motion  feet .  No crepitus or effusions noted.  No bony pathology or digital deformities noted.  Skin  normotropic skin with no porokeratosis noted bilaterally.  No signs of infections or ulcers noted.     Onychomycosis  Pain in right toes  Pain in left toes  Consent was obtained for treatment procedures.   Mechanical debridement of nails 1-5  bilaterally performed with a nail nipper.  Filed with dremel without incident.    Return office visit   12 weeks                  Told patient to return for periodic foot care and evaluation due to potential at risk complications.   Ruffin Cotton DPM

## 2024-05-05 ENCOUNTER — Ambulatory Visit: Admitting: Internal Medicine

## 2024-05-05 ENCOUNTER — Encounter: Payer: Self-pay | Admitting: Internal Medicine

## 2024-05-05 VITALS — BP 110/80 | HR 59 | Temp 98.4°F | Ht 68.0 in | Wt 192.8 lb

## 2024-05-05 DIAGNOSIS — Z7409 Other reduced mobility: Secondary | ICD-10-CM

## 2024-05-05 DIAGNOSIS — Z95811 Presence of heart assist device: Secondary | ICD-10-CM

## 2024-05-05 DIAGNOSIS — E1122 Type 2 diabetes mellitus with diabetic chronic kidney disease: Secondary | ICD-10-CM | POA: Diagnosis not present

## 2024-05-05 DIAGNOSIS — H0011 Chalazion right upper eyelid: Secondary | ICD-10-CM

## 2024-05-05 DIAGNOSIS — I878 Other specified disorders of veins: Secondary | ICD-10-CM | POA: Diagnosis not present

## 2024-05-05 DIAGNOSIS — I5042 Chronic combined systolic (congestive) and diastolic (congestive) heart failure: Secondary | ICD-10-CM

## 2024-05-05 DIAGNOSIS — K429 Umbilical hernia without obstruction or gangrene: Secondary | ICD-10-CM

## 2024-05-05 DIAGNOSIS — I480 Paroxysmal atrial fibrillation: Secondary | ICD-10-CM | POA: Diagnosis not present

## 2024-05-05 DIAGNOSIS — I130 Hypertensive heart and chronic kidney disease with heart failure and stage 1 through stage 4 chronic kidney disease, or unspecified chronic kidney disease: Secondary | ICD-10-CM

## 2024-05-05 DIAGNOSIS — F5102 Adjustment insomnia: Secondary | ICD-10-CM

## 2024-05-05 DIAGNOSIS — M1A3790 Chronic gout due to renal impairment, unspecified ankle and foot, without tophus (tophi): Secondary | ICD-10-CM | POA: Diagnosis not present

## 2024-05-05 DIAGNOSIS — N1832 Chronic kidney disease, stage 3b: Secondary | ICD-10-CM | POA: Diagnosis not present

## 2024-05-05 DIAGNOSIS — E782 Mixed hyperlipidemia: Secondary | ICD-10-CM

## 2024-05-05 DIAGNOSIS — E032 Hypothyroidism due to medicaments and other exogenous substances: Secondary | ICD-10-CM | POA: Diagnosis not present

## 2024-05-05 DIAGNOSIS — R531 Weakness: Secondary | ICD-10-CM

## 2024-05-05 DIAGNOSIS — R6889 Other general symptoms and signs: Secondary | ICD-10-CM

## 2024-05-05 MED ORDER — ALLOPURINOL 100 MG PO TABS: 100.0000 mg | ORAL_TABLET | Freq: Every day | ORAL | 2 refills | Status: DC

## 2024-05-05 MED ORDER — ZYLET 0.5-0.3 % OP SUSP: 1.0000 [drp] | Freq: Four times a day (QID) | OPHTHALMIC | Status: DC

## 2024-05-05 MED ORDER — TEMAZEPAM 7.5 MG PO CAPS
ORAL_CAPSULE | ORAL | 1 refills | Status: DC
Start: 2024-05-05 — End: 2024-05-22

## 2024-05-05 MED ORDER — CARVEDILOL 6.25 MG PO TABS: 6.2500 mg | ORAL_TABLET | Freq: Two times a day (BID) | ORAL | 1 refills | Status: DC

## 2024-05-05 MED ORDER — APIXABAN 5 MG PO TABS: 5.0000 mg | ORAL_TABLET | Freq: Two times a day (BID) | ORAL | 1 refills | Status: DC

## 2024-05-05 NOTE — Progress Notes (Signed)
 I,Victoria T Emmitt, CMA,acting as a Neurosurgeon for Catheryn LOISE Slocumb, MD.,have documented all relevant documentation on the behalf of Catheryn LOISE Slocumb, MD,as directed by  Catheryn LOISE Slocumb, MD while in the presence of Catheryn LOISE Slocumb, MD.  Subjective:  Patient ID: Eric [name]., MD , male    DOB: 1951-01-01 , 73 y.o.   MRN: [id number]  Chief Complaint  Patient presents with   Diabetes    Patient presents today for dm, bp, thyroid  & cholesterol follow up. He reports compliance with medications. Denies headache, chest pain & sob. He states prescribing himself Doxycyline, he just started this past weekend. He is having trouble with his left eye, he admits some soreness.  He has consultation with GI in Sept.  Denies completing DM eye exam for this year.    Hypertension   Hypothyroidism   Hyperlipidemia    HPI Discussed the use of AI scribe software for clinical note transcription with the patient, who gave verbal consent to proceed.  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  He is overdue for an eye exam, having last seen an ophthalmologist last year, and plans to schedule an appointment soon, possibly with a different provider due to a current chalazion on his right eyelid. He has been self-treating the chalazion with doxycycline and ZYLET  drops, which contain tobramycin and a mild steroid. The chalazion has been present for a few months but has enlarged over the past week. He has been using warm compresses to encourage drainage.  He mentions experiencing weight loss and muscle mass reduction, which he attributes to semaglutide  use. His appetite remains good, and he is focusing on a diet of chicken, fish, and vegetarian options, avoiding red meat. 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  He has a history of elevated TSH levels and is currently on levothyroxine . He recalls being on amiodarone , which necessitates frequent thyroid  monitoring. He is taking the generic form of levothyroxine  and is aware of potential dose adjustments based on his thyroid  function tests.  He has not been taking allopurinol  recently, possibly due to forgetting to refill the prescription. He typically experiences gout attacks in his toe, occasionally in the ankle. He recalls having recurrent attacks last year but has not had any recent episodes. He was previously on a daily dose of 100 mg allopurinol , which may need adjustment due to kidney function.  He has a history of kidney issues, with creatinine levels previously noted to be elevated. He is on olmesartan and has had uric acid levels checked in the past. He is also on Farxiga , carvedilol , and has been alternating doses of Lasix  to manage fluid retention.  He mentions discoloration on his legs, which he associates with venous stasis. He wears compression socks to manage swelling, particularly on days when he is more active.  He has an umbilical hernia that he has noticed in the past four to six months. It does not cause him pain, but he is aware of its presence, especially when he needs to have a bowel movement. He has regular bowel movements two to three times a day.  He has been engaging in physical activities such as golf, encouraged by his son, who has taken up the sport seriously. He has played golf twice  with his son and acknowledges the physical exercise involved in the activity.   Hypertension This is a chronic problem. The current episode started more than 1 year ago. The problem has been gradually improving since onset. The problem is controlled. Pertinent negatives include no blurred vision, chest pain, palpitations or shortness of breath. Risk factors for coronary artery disease include diabetes mellitus,  dyslipidemia, sedentary lifestyle, obesity, male gender and stress. Past treatments include beta blockers and diuretics. The current treatment provides moderate improvement. Hypertensive end-organ damage includes kidney disease. There is no history of CVA.  Diabetes He presents for his follow-up diabetic visit. He has type 2 diabetes mellitus. There are no hypoglycemic associated symptoms. Pertinent negatives for diabetes include no blurred vision, no chest pain, no polydipsia, no polyphagia and no polyuria. There are no hypoglycemic complications. There are no diabetic complications. Pertinent negatives for diabetic complications include no CVA. Risk factors for coronary artery disease include obesity and sedentary lifestyle. Current diabetic treatment includes oral agent (dual therapy). He is following a diabetic diet. Meal planning includes avoidance of concentrated sweets. He participates in exercise intermittently. His breakfast blood glucose is taken between 8-9 am. His breakfast blood glucose range is generally 110-130 mg/dl. An ACE inhibitor/angiotensin II receptor blocker is not being taken. He sees a podiatrist.Eye exam is not current.  Hyperlipidemia This is a chronic problem. The current episode started more than 1 year ago. Exacerbating diseases include diabetes and hypothyroidism. He has no history of obesity. Pertinent negatives include no chest pain or shortness of breath. Current antihyperlipidemic treatment includes statins.     Past Medical History:  Diagnosis Date   Acute blood loss anemia    Acute encephalopathy    Acute hypoxemic respiratory failure (HCC)    Acute idiopathic gout of right ankle    Acute lumbar back pain    Acute on chronic systolic heart failure (HCC)    Acute respiratory failure (HCC)    AKI (acute kidney injury) (HCC)    Altered mental status    Aortic aneurysm without rupture (HCC) 02/09/2016   Aortic root enlargement (HCC) 02/13/2012   Aortic valve  regurgitation, acquired 02/13/2012   Arrhythmia    Atrial flutter (HCC) 03/18/2012   Back pain    Cardiac arrest (HCC) 02/01/2016   CHF (congestive heart failure) (HCC)    Chronic anticoagulation 03/04/2013   Chronic combined systolic and diastolic heart failure (HCC)    Chronic kidney disease    kidney fx studies increased    Chronic lower back pain    Chronic renal insufficiency    Chronic systolic heart failure (HCC) 02/13/2012   Recent diagnosis 4 / 2013, LVEF 25% by Echo 12/2011  03/2013: Echo at [facility] Cardiology Conclusions: 1. Left ventricular ejection fraction estimated by 2D at 40-45 percent. 2. Mild concentric left ventricular hypertrophy. 3. Mild left atrial enlargement. 4. Moderate aortic valve regurgitation. 5. The aortic root at the sinus(es) of valsalva is moderately dilated 6. Mild mitral valve regurgitation. 7.   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  Goals of care, counseling/discussion 05/05/2020   HCAP (healthcare-associated pneumonia)    Heart murmur    Hyperlipidemia 02/13/2012   Hypertension    Hypothyroidism    Internal hemorrhoids without mention of complication 04/11/2013   Labile blood pressure    Left bundle branch block 02/13/2012   Leg weakness, bilateral    Long term (current) use of anticoagulants 08/06/2013   Eliquis  therapy    Long term current use of amiodarone  08/07/2016   Lower extremity weakness    Migraine 02/13/2012   opthalmic   Multiple myeloma (HCC) 05/05/2020   Non-traumatic rhabdomyolysis    Obesity (BMI 30-39.9) 02/13/2012   Pain     Paroxysmal atrial fibrillation (HCC)    Pneumonia    Retroperitoneal bleed    Right ankle pain    Right knee pain    Severe aortic regurgitation 02/09/2016   Special screening for malignant neoplasms, colon 04/11/2013   Thyroid  activity decreased    Tibial pain    Varicose vein of leg    right   Ventricular fibrillation (HCC) 02/09/2016   Weakness of both lower extremities      Family History  Problem Relation Age of Onset   Hypertension Mother    Heart disease Mother    Heart failure Mother    Diabetes Mother    Hypertension Father    Heart disease Father    Heart failure Father      Current Outpatient Medications:    acetaminophen  (TYLENOL ) 500 MG tablet, Take 1,000 mg by mouth every 4 (four) hours as needed for moderate pain or headache., Disp: , Rfl:    amiodarone  (PACERONE ) 100 MG tablet, TAKE 1 TABLET(100 MG) BY MOUTH DAILY, Disp: 90 tablet, Rfl: 1   cholecalciferol (VITAMIN D3) 25 MCG (1000 UNIT) tablet, Take 5,000 Units by mouth daily., Disp: , Rfl:    dapagliflozin  propanediol (FARXIGA ) 10 MG TABS tablet, TAKE 1 TABLET(10 MG) BY MOUTH DAILY BEFORE BREAKFAST, Disp: 30 tablet, Rfl: 5   doxycycline (VIBRAMYCIN) 100 MG capsule, Take by mouth., Disp: , Rfl:    fluticasone  (FLONASE ) 50 MCG/ACT nasal spray, Place 2 sprays into both nostrils daily., Disp: 16 g, Rfl: 2   furosemide  (LASIX ) 40 MG tablet, Take 40 mg by mouth. Every other day, Disp: , Rfl:    furosemide  (LASIX ) 80 MG tablet, Alternate one tablet (80mg ) and half tablet (40mg ) every other day. Okay to take whole tablet as needed for fluid retention, edema., Disp: 90 tablet, Rfl: 3   Hypromellose (ARTIFICIAL TEARS OP), Place 1 drop into both eyes daily as needed (dry eyes)., Disp: , Rfl:    levothyroxine  (SYNTHROID ) 75 MCG tablet, Take 1 tablet (75 mcg total) by mouth daily before breakfast., Disp: 90 tablet, Rfl: 2   loperamide (IMODIUM) 2 MG capsule, Take 2 mg by mouth as needed for diarrhea or loose stools., Disp:  , Rfl:    Loteprednol-Tobramycin (ZYLET ) 0.5-0.3 % SUSP, Place 1 drop into both eyes 4 (four) times daily., Disp: , Rfl:    olmesartan (BENICAR) 5 MG tablet, Take 5 mg by mouth daily., Disp: , Rfl:    potassium chloride  20 MEQ/15ML (10%) SOLN, TAKE 15 ML BY MOUTH TWICE DAILY, Disp: 946 mL, Rfl: 1   rosuvastatin  (CRESTOR ) 20 MG tablet, TAKE 1 TABLET(20 MG) BY MOUTH DAILY, Disp: 90 tablet, Rfl: 2   Semaglutide ,0.25 or 0.5MG /DOS, (OZEMPIC , 0.25 OR 0.5 MG/DOSE,) 2 MG/3ML SOPN, INJECT 0.5 MG UNDER THE SKIN ONCE WEEKLY., Disp: 3 mL, Rfl: 2  temazepam  (RESTORIL ) 7.5 MG capsule, One to two capsules po at bedtime prn, Disp: 60 capsule, Rfl: 1   vitamin B-12 (CYANOCOBALAMIN) 100 MCG tablet, Take 100 mcg by mouth daily., Disp: , Rfl:    vitamin C (ASCORBIC ACID) 500 MG tablet, Take 500 mg by mouth daily., Disp: , Rfl:    allopurinol  (ZYLOPRIM ) 100 MG tablet, Take 1 tablet (100 mg total) by mouth daily., Disp: 90 tablet, Rfl: 2   apixaban  (ELIQUIS ) 5 MG TABS tablet, Take 1 tablet (5 mg total) by mouth 2 (two) times daily. TAKE 1 TABLET(5 MG) BY MOUTH TWICE DAILY, Disp: 180 tablet, Rfl: 1   carvedilol  (COREG ) 6.25 MG tablet, Take 1 tablet (6.25 mg total) by mouth 2 (two) times daily., Disp: 180 tablet, Rfl: 1   Allergies  Allergen Reactions   Lactose Diarrhea   Lactose Intolerance (Gi) Diarrhea     Review of Systems  Constitutional: Negative.   Eyes:  Negative for blurred vision.  Respiratory: Negative.  Negative for shortness of breath.   Cardiovascular: Negative.  Negative for chest pain and palpitations.  Gastrointestinal: Negative.   Endocrine: Negative.  Negative for polydipsia, polyphagia and polyuria.  Skin: Negative.   Allergic/Immunologic: Negative.   Hematological: Negative.      Today's Vitals   05/05/24 1542  BP: 110/80  Pulse: (!) 59  Temp: 98.4 F (36.9 C)  SpO2: 98%  Weight: 192 lb 12.8 oz (87.5 kg)  Height: 5' 8 (1.727 m)   Body mass index is 29.32 kg/m.  Wt Readings  from Last 3 Encounters:  05/05/24 192 lb 12.8 oz (87.5 kg)  12/31/23 198 lb (89.8 kg)  12/28/23 196 lb (88.9 kg)     Objective:  Physical Exam Vitals and nursing note reviewed.  Constitutional:      Appearance: Normal appearance.  HENT:     Head: Normocephalic and atraumatic.  Eyes:     Extraocular Movements: Extraocular movements intact.     Comments: Soft tissue mass R upr eyelid  Cardiovascular:     Rate and Rhythm: Normal rate. Rhythm irregular.     Heart sounds: Normal heart sounds.  Pulmonary:     Effort: Pulmonary effort is normal.     Breath sounds: Normal breath sounds.  Abdominal:     General: Bowel sounds are normal.     Palpations: Abdomen is soft.     Hernia: A hernia is present.     Comments: Abdominal wall defect noted at umbilicus  Musculoskeletal:     Cervical back: Normal range of motion.  Skin:    General: Skin is warm.     Comments: Hyperpigmented skin LE  Neurological:     General: No focal deficit present.     Mental Status: He is alert.  Psychiatric:        Mood and Affect: Mood normal.         Assessment And Plan:  Type 2 diabetes mellitus with stage 3b chronic kidney disease, without long-term current use of insulin  [facility]) Assessment & Plan: Diabetes management ongoing. Muscle mass loss possibly related to semaglutide  use. - Check A1c level. - Encourage adequate protein intake. ------CKD monitored. GFR fluctuating between stage 3b and 4. Lasix  used with alternating doses. Rosuvastatin  may need change due to CKD. - Monitor kidney function. - Discuss rosuvastatin  switch with nephrologist. - Avoid buying more rosuvastatin . - Consider atorvastatin  if needed.  Orders: -     Hemoglobin A1c -     BMP8+EGFR  Hypertensive heart and  renal disease with renal failure, stage 1 through stage 4 or unspecified chronic kidney disease, with heart failure (HCC) Assessment & Plan: Chronic, well controlled.  He will continue with carvedilol  6.25mg  twice  daily and furosemide . He is now on olmesartan 5mg  daily as per Renal. He is no longer on Bidil  due to lightheadedness/dizziness.    Orders: -     BMP8+EGFR -     Carvedilol ; Take 1 tablet (6.25 mg total) by mouth 2 (two) times daily.  Dispense: 180 tablet; Refill: 1  Chronic combined systolic and diastolic CHF (congestive heart failure) (HCC) Assessment & Plan: Chronic, appears euvolemic.  He is now under the care of Dr. Annabella Scarce.  Importance of dietary/medication compliance was discussed with the patient. He will continue with carvedilol  6.25mg  twice daily, and furosemide  80mg  alternating with 40mg  daily. Per Cardiology, he is no longer on Bidil  due to dizziness.   Paroxysmal atrial fibrillation (HCC) Assessment & Plan: Chronic, currently managed with amiodarone  and Eliquis . - Continue amiodarone  and Eliquis .  Orders: -     Apixaban ; Take 1 tablet (5 mg total) by mouth 2 (two) times daily. TAKE 1 TABLET(5 MG) BY MOUTH TWICE DAILY  Dispense: 180 tablet; Refill: 1  Hypothyroidism due to medication Assessment & Plan: TSH previously elevated. On levothyroxine . Amiodarone  may affect thyroid  function. - Check TSH level. - Adjust levothyroxine  dose based on TSH results.  Orders: -     TSH + free T4  Decreased strength, endurance, and mobility Assessment & Plan: Peripheral neuropathy. Previous physical therapy focused on leg strength. Plan to rejoin gym. - Refer to physical therapy for muscle strengthening. - Rejoin gym for exercise regimen.  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 Allopurinol  not taken recently. Creatinine levels may affect dosing. - Check uric acid level. - Refill allopurinol  and consider dosing adjustment to Monday, Wednesday, Friday.  Orders: -     Uric acid  Chalazion of right upper eyelid Assessment &  Plan: Chalazion enlarged. Considering injection or drainage if unresolved. - Consider referral for injection or drainage if chalazion persists.   Presence of heart assist device [facility]) Assessment & Plan: Prior ventricular fibrillation cardiac arrest in 2017 at which time CPR was provided by his wife with residual RLE numbness and peripheral neuropathy. ICD placed at that time.    Umbilical hernia without obstruction and without gangrene Assessment & Plan: Umbilical hernia asymptomatic. Noticed in last 4-6 months. - Consider CT scan without contrast to assess hernia size. - Consider referral to a surgeon for evaluation.   Venous stasis Assessment & Plan: Early venous stasis changes noted. No significant fluid retention today. - Consider wearing compression socks on busy days.   Other orders -     Allopurinol ; Take 1 tablet (100 mg total) by mouth daily.  Dispense: 90 tablet; Refill: 2 -     Temazepam ; One to two capsules po at bedtime prn  Dispense: 60 capsule; Refill: 1 -     Zylet ; Place 1 drop into both eyes 4 (four) times daily.   Return for 4 month dm f/u.SABRA  Patient was given opportunity to ask questions. Patient verbalized understanding of the plan and was able to repeat key elements of the plan. All questions were answered to their satisfaction.   I, Catheryn LOISE Slocumb, MD, have reviewed all documentation for this visit. The documentation on 05/05/24  for the exam, diagnosis, procedures, and orders are all accurate and complete.   IF YOU HAVE BEEN REFERRED TO A SPECIALIST, IT MAY TAKE 1-2 WEEKS TO SCHEDULE/PROCESS THE REFERRAL. IF YOU HAVE NOT HEARD FROM US /SPECIALIST IN TWO WEEKS, PLEASE GIVE US  A CALL AT [phone] X [phone].   THE PATIENT IS ENCOURAGED TO PRACTICE SOCIAL DISTANCING DUE TO THE COVID-19 PANDEMIC.

## 2024-05-05 NOTE — Patient Instructions (Signed)

## 2024-05-06 ENCOUNTER — Encounter: Payer: Self-pay | Admitting: Hematology & Oncology

## 2024-05-06 LAB — BMP8+EGFR
BUN/Creatinine Ratio: 15 (ref 10–24)
BUN: 34 mg/dL — ABNORMAL HIGH (ref 8–27)
CO2: 19 mmol/L — ABNORMAL LOW (ref 20–29)
Calcium: 9 mg/dL (ref 8.6–10.2)
Chloride: 107 mmol/L — ABNORMAL HIGH (ref 96–106)
Creatinine, Ser: 2.34 mg/dL — ABNORMAL HIGH (ref 0.76–1.27)
Glucose: 84 mg/dL (ref 70–99)
Potassium: 4.1 mmol/L (ref 3.5–5.2)
Sodium: 139 mmol/L (ref 134–144)
eGFR: 29 mL/min/1.73 — ABNORMAL LOW (ref >59)

## 2024-05-06 LAB — TSH+FREE T4
Free T4: 1.8 ng/dL — ABNORMAL HIGH (ref 0.82–1.77)
TSH: 3.96 u[IU]/mL (ref 0.450–4.500)

## 2024-05-06 LAB — HEMOGLOBIN A1C
Est. average glucose Bld gHb Est-mCnc: 114 mg/dL
Hgb A1c MFr Bld: 5.6 % (ref 4.8–5.6)

## 2024-05-06 LAB — URIC ACID: Uric Acid: 8.9 mg/dL — ABNORMAL HIGH (ref 3.8–8.4)

## 2024-05-07 ENCOUNTER — Ambulatory Visit: Payer: Self-pay | Admitting: Internal Medicine

## 2024-05-11 DIAGNOSIS — H0011 Chalazion right upper eyelid: Secondary | ICD-10-CM | POA: Insufficient documentation

## 2024-05-11 DIAGNOSIS — K429 Umbilical hernia without obstruction or gangrene: Secondary | ICD-10-CM | POA: Insufficient documentation

## 2024-05-11 DIAGNOSIS — I878 Other specified disorders of veins: Secondary | ICD-10-CM | POA: Insufficient documentation

## 2024-05-11 DIAGNOSIS — M1A3790 Chronic gout due to renal impairment, unspecified ankle and foot, without tophus (tophi): Secondary | ICD-10-CM | POA: Insufficient documentation

## 2024-05-11 NOTE — Assessment & Plan Note (Signed)
 Chronic, well controlled.  He will continue with carvedilol 6.25mg  twice daily and furosemide. He is now on olmesartan 5mg  daily as per Renal. He is no longer on Bidil due to lightheadedness/dizziness.

## 2024-05-11 NOTE — Assessment & Plan Note (Addendum)
 Diabetes management ongoing. Muscle mass loss possibly related to semaglutide  use. - Check A1c level. - Encourage adequate protein intake. ------CKD monitored. GFR fluctuating between stage 3b and 4. Lasix  used with alternating doses. Rosuvastatin  may need change due to CKD. - Monitor kidney function. - Discuss rosuvastatin  switch with nephrologist. - Avoid buying more rosuvastatin . - Consider atorvastatin  if needed.

## 2024-05-11 NOTE — Assessment & Plan Note (Signed)
 Prior ventricular fibrillation cardiac arrest in 2017 at which time CPR was provided by his wife with residual RLE numbness and peripheral neuropathy. ICD placed at that time.

## 2024-05-11 NOTE — Assessment & Plan Note (Signed)
 Umbilical hernia asymptomatic. Noticed in last 4-6 months. - Consider CT scan without contrast to assess hernia size. - Consider referral to a surgeon for evaluation.

## 2024-05-11 NOTE — Assessment & Plan Note (Signed)
 Chronic, currently managed with amiodarone and Eliquis. - Continue amiodarone and Eliquis.

## 2024-05-11 NOTE — Assessment & Plan Note (Signed)
 Peripheral neuropathy. Previous physical therapy focused on leg strength. Plan to rejoin gym. - Refer to physical therapy for muscle strengthening. - Rejoin gym for exercise regimen.

## 2024-05-11 NOTE — Assessment & Plan Note (Signed)
 Gout not active. Allopurinol  not taken recently. Creatinine levels may affect dosing. - Check uric acid level. - Refill allopurinol  and consider dosing adjustment to Monday, Wednesday, Friday.

## 2024-05-11 NOTE — Assessment & Plan Note (Signed)
 Early venous stasis changes noted. No significant fluid retention today. - Consider wearing compression socks on busy days.

## 2024-05-11 NOTE — Assessment & Plan Note (Signed)
 Chronic, appears euvolemic.  He is now under the care of Dr. Annabella Scarce.  Importance of dietary/medication compliance was discussed with the patient. He will continue with carvedilol  6.25mg  twice daily, and furosemide  80mg  alternating with 40mg  daily. Per Cardiology, he is no longer on Bidil  due to dizziness.

## 2024-05-11 NOTE — Assessment & Plan Note (Signed)
 TSH previously elevated. On levothyroxine . Amiodarone  may affect thyroid  function. - Check TSH level. - Adjust levothyroxine  dose based on TSH results.

## 2024-05-11 NOTE — Assessment & Plan Note (Signed)
 Chalazion enlarged. Considering injection or drainage if unresolved. - Consider referral for injection or drainage if chalazion persists.

## 2024-05-13 ENCOUNTER — Other Ambulatory Visit: Payer: Self-pay

## 2024-05-13 MED ORDER — ATORVASTATIN CALCIUM 40 MG PO TABS: 40.0000 mg | ORAL_TABLET | Freq: Every day | ORAL | 1 refills | Status: DC

## 2024-05-13 MED ORDER — ALLOPURINOL 100 MG PO TABS: 100.0000 mg | ORAL_TABLET | Freq: Every day | ORAL | 2 refills | Status: DC

## 2024-05-15 ENCOUNTER — Other Ambulatory Visit: Payer: Self-pay | Admitting: Internal Medicine

## 2024-05-21 DIAGNOSIS — R351 Nocturia: Secondary | ICD-10-CM | POA: Diagnosis not present

## 2024-05-21 DIAGNOSIS — N5201 Erectile dysfunction due to arterial insufficiency: Secondary | ICD-10-CM | POA: Diagnosis not present

## 2024-05-21 DIAGNOSIS — N401 Enlarged prostate with lower urinary tract symptoms: Secondary | ICD-10-CM | POA: Diagnosis not present

## 2024-05-21 DIAGNOSIS — R310 Gross hematuria: Secondary | ICD-10-CM | POA: Diagnosis not present

## 2024-05-22 ENCOUNTER — Other Ambulatory Visit: Payer: Self-pay | Admitting: Internal Medicine

## 2024-05-22 MED ORDER — TEMAZEPAM 15 MG PO CAPS: 15.0000 mg | ORAL_CAPSULE | Freq: Every evening | ORAL | 0 refills | Status: DC | PRN

## 2024-05-23 NOTE — Progress Notes (Signed)
 Remote ICD transmission.

## 2024-05-28 ENCOUNTER — Encounter: Payer: Self-pay | Admitting: Gastroenterology

## 2024-05-28 ENCOUNTER — Ambulatory Visit: Admitting: Gastroenterology

## 2024-05-28 ENCOUNTER — Telehealth: Payer: Self-pay

## 2024-05-28 VITALS — BP 122/80 | HR 87 | Wt 191.4 lb

## 2024-05-28 DIAGNOSIS — I480 Paroxysmal atrial fibrillation: Secondary | ICD-10-CM | POA: Diagnosis not present

## 2024-05-28 DIAGNOSIS — Z7901 Long term (current) use of anticoagulants: Secondary | ICD-10-CM

## 2024-05-28 DIAGNOSIS — Z1211 Encounter for screening for malignant neoplasm of colon: Secondary | ICD-10-CM

## 2024-05-28 MED ORDER — NA SULFATE-K SULFATE-MG SULF 17.5-3.13-1.6 GM/177ML PO SOLN: 1.0000 | Freq: Once | ORAL | 0 refills | Status: AC

## 2024-05-28 NOTE — Telephone Encounter (Signed)
 [facility] Pre-operative Risk Assessment     Request for surgical clearance:     Endoscopy Procedure  What type of surgery is being performed?     endoscopic  When is this surgery scheduled?     07/28/24  What type of clearance is required ?   Pharmacy  Are there any medications that need to be held prior to surgery and how long? Eliquis  2 days  Practice name and name of physician performing surgery?      [facility]  What is your office phone and fax number?      Phone- [phone]  Fax- [phone]  Anesthesia type (None, local, MAC, general) ?       MAC   Please route your response to Karna Louder, RMA

## 2024-05-28 NOTE — Telephone Encounter (Signed)
 Pharmacy clearance requested for endoscopy. Please provide DOAC recommendations and send back to preop pool

## 2024-05-28 NOTE — Patient Instructions (Signed)
 We have sent the following medications to your pharmacy for you to pick up at your convenience: SUPREP  You have been scheduled for a colonoscopy. Please follow written instructions given to you at your visit today.   If you use inhalers (even only as needed), please bring them with you on the day of your procedure.  DO NOT TAKE 7 DAYS PRIOR TO TEST- Trulicity (dulaglutide) Ozempic , Wegovy  (semaglutide ) Mounjaro (tirzepatide) Bydureon Bcise (exanatide extended release)  DO NOT TAKE 1 DAY PRIOR TO YOUR TEST Rybelsus  (semaglutide ) Adlyxin (lixisenatide) Victoza (liraglutide) Byetta (exanatide) ___________________________________________________________________________  Due to recent changes in healthcare laws, you may see the results of your imaging and laboratory studies on MyChart before your provider has had a chance to review them.  We understand that in some cases there may be results that are confusing or concerning to you. Not all laboratory results come back in the same time frame and the provider may be waiting for multiple results in order to interpret others.  Please give us  48 hours in order for your provider to thoroughly review all the results before contacting the office for clarification of your results.   _______________________________________________________  If your blood pressure at your visit was 140/90 or greater, please contact your primary care physician to follow up on this.  _______________________________________________________  If you are age 22 or older, your body mass index should be between 23-30. Your Body mass index is 29.1 kg/m. If this is out of the aforementioned range listed, please consider follow up with your Primary Care Provider.  If you are age 60 or younger, your body mass index should be between 19-25. Your Body mass index is 29.1 kg/m. If this is out of the aformentioned range listed, please consider follow up with your Primary Care  Provider.   ________________________________________________________  The [facility] providers would like to encourage you to use MYCHART to communicate with providers for non-urgent requests or questions.  Due to long hold times on the telephone, sending your provider a message by [facility] may be a faster and more efficient way to get a response.  Please allow 48 business hours for a response.  Please remember that this is for non-urgent requests.  _______________________________________________________  [facility] is using a team-based approach to care.  Your team is made up of your doctor and two to three APPS. Our APPS (Nurse Practitioners and Physician Assistants) work with your physician to ensure care continuity for you. They are fully qualified to address your health concerns and develop a treatment plan. They communicate directly with your gastroenterologist to care for you. Seeing the Advanced Practice Practitioners on your physician's team can help you by facilitating care more promptly, often allowing for earlier appointments, access to diagnostic testing, procedures, and other specialty referrals.   Thank you for trusting me with your gastrointestinal care. Deanna May, FNP-C

## 2024-05-28 NOTE — Progress Notes (Signed)
 Chief Complaint:Screening for colon cancer  Primary GI Doctor: Dr. Charlanne  HPI: Patient is a  73  year old A.A. male patient with past medical history ofHFrEF, atrial fibrillation, VF cardiac arrest s/p ICD, CAD, severe aortic regurgitation s/p AVR 10/2018 and aortic root replacement with CABGx1, aortic dilation, hypertension, hypothyroidism, type 2 diabetes, CKD IIIb-IV, IgG kappa myeloma, who was referred to me by Jarold Medici, MD on 02/20/24 for a evaluation of screening for colon cancer. He is a practicing ophthalmologist.  07/26/2023 last seen by cardiology, reviewed entire note.  Interval History     Patient presents for evaluation of colon screening colonoscopy. Patient denies altered bowel habits, abdominal pain, or rectal bleeding. Patient denies GERD or dysphagia. Patient denies nausea, vomiting, or weight loss.  Nonsmoker. Patient drinks occasional wine.   Patient on Semaglutide , no current issues.   Patient on Eliquis  5 mg po twice daily  Patient's family history includes: no esophageal CA or Colon CA  Wt Readings from Last 3 Encounters:  05/28/24 191 lb 6 oz (86.8 kg)  05/05/24 192 lb 12.8 oz (87.5 kg)  12/31/23 198 lb (89.8 kg)    Past Medical History:  Diagnosis Date   Acute blood loss anemia    Acute encephalopathy    Acute hypoxemic respiratory failure (HCC)    Acute idiopathic gout of right ankle    Acute lumbar back pain    Acute on chronic systolic heart failure (HCC)    Acute respiratory failure (HCC)    AKI (acute kidney injury) (HCC)    Altered mental status    Aortic aneurysm without rupture (HCC) 02/09/2016   Aortic root enlargement (HCC) 02/13/2012   Aortic valve regurgitation, acquired 02/13/2012   Arrhythmia    Atrial flutter (HCC) 03/18/2012   Back pain    Cardiac arrest (HCC) 02/01/2016   CHF (congestive heart failure) (HCC)    Chronic anticoagulation 03/04/2013   Chronic combined systolic and diastolic heart failure (HCC)    Chronic kidney  disease    kidney fx studies increased    Chronic lower back pain    Chronic renal insufficiency    Chronic systolic heart failure (HCC) 02/13/2012   Recent diagnosis 4 / 2013, LVEF 25% by Echo 12/2011  03/2013: Echo at [facility] Conclusions: 1. Left ventricular ejection fraction estimated by 2D at 40-45 percent. 2. Mild concentric left ventricular hypertrophy. 3. Mild left atrial enlargement. 4. Moderate aortic valve regurgitation. 5. The aortic root at the sinus(es) of valsalva is moderately dilated 6. Mild mitral valve regurgitation. 7.   CKD (chronic kidney disease)    CKD (chronic kidney disease), stage IV (HCC) 08/06/2013   Creatinine 2.4 on 07/04/13    Claustrophobia    Colon cancer screening 03/04/2013   Debility 02/22/2016   Diabetes mellitus type 2 in nonobese [facility])    Diabetes mellitus type 2 in obese    Dysrhythmia    palpitations   Encounter for central line placement    Exertional dyspnea 01/2012   Femoral nerve injury 02/22/2016   Femoral neuropathy    Goals of care, counseling/discussion 05/05/2020   HCAP (healthcare-associated pneumonia)    Heart murmur    Hyperlipidemia 02/13/2012   Hypertension    Hypothyroidism    Internal hemorrhoids without mention of complication 04/11/2013   Labile blood pressure    Left bundle branch block 02/13/2012   Leg weakness, bilateral    Long term (current) use of anticoagulants 08/06/2013   Eliquis  therapy  Long term current use of amiodarone  08/07/2016   Lower extremity weakness    Migraine 02/13/2012   opthalmic   Multiple myeloma (HCC) 05/05/2020   Non-traumatic rhabdomyolysis    Obesity (BMI 30-39.9) 02/13/2012   Pain    Paroxysmal atrial fibrillation (HCC)    Pneumonia    Retroperitoneal bleed    Right ankle pain    Right knee pain    Severe aortic regurgitation 02/09/2016   Special screening for malignant neoplasms, colon 04/11/2013   Thyroid  activity decreased    Tibial pain    Varicose vein of leg     right   Ventricular fibrillation (HCC) 02/09/2016   Weakness of both lower extremities     Past Surgical History:  Procedure Laterality Date   AORTIC VALVE REPLACEMENT  11/20/2018   CARDIAC CATHETERIZATION N/A 02/17/2016   Procedure: Left Heart Cath and Coronary Angiography;  Surgeon: Candyce GORMAN Reek, MD;  Location: [facility] INVASIVE CV LAB;  Service: Cardiovascular;  Laterality: N/A;   CARDIOVERSION  03/22/2012   Procedure: CARDIOVERSION;  Surgeon: Oneil Parchment, MD;  Location: [facility] ENDOSCOPY;  Service: Cardiovascular;  Laterality: N/A;   CARDIOVERSION  04/19/2012   Procedure: CARDIOVERSION;  Surgeon: Victory LELON Claudene DOUGLAS, MD;  Location: [facility] OR;  Service: Cardiovascular;  Laterality: N/A;   COLONOSCOPY N/A 04/11/2013   Procedure: COLONOSCOPY;  Surgeon: Lamar JONETTA Aho, MD;  Location: [facility] ENDOSCOPY;  Service: Endoscopy;  Laterality: N/A;   COLONOSCOPY N/A 04/11/2013   Procedure: COLONOSCOPY;  Surgeon: Lamar JONETTA Aho, MD;  Location: [facility] ENDOSCOPY;  Service: Endoscopy;  Laterality: N/A;   EP IMPLANTABLE DEVICE N/A 02/17/2016   Procedure: BiV ICD Insertion CRT-D;  Surgeon: Danelle LELON Birmingham, MD;  Location: [facility] INVASIVE CV LAB;  Service: Cardiovascular;  Laterality: N/A;   FINGER SURGERY  2012   4th digit right hand; thumb on left hand   RADIOLOGY WITH ANESTHESIA N/A 02/11/2016   Procedure: MRI OF THE BRAIN WITHOUT CONTRAST, LUMBAR WITHOUT CONTRAST;  Surgeon: Medication Radiologist, MD;  Location: [facility] OR;  Service: Radiology;  Laterality: N/A;  DR. WOOD/MRI   RIGHT/LEFT HEART CATH AND CORONARY ANGIOGRAPHY N/A 07/26/2018   Procedure: RIGHT/LEFT HEART CATH AND CORONARY ANGIOGRAPHY;  Surgeon: Claudene Victory LELON, MD;  Location: [facility] INVASIVE CV LAB;  Service: Cardiovascular;  Laterality: N/A;   Skin melanocytoma excision  2012   above left clavicle   STERNAL INCISION RECLOSURE  11/2018   STERNAL WIRE REMOVAL  11/2018   STERNAL WOUND DEBRIDEMENT  11/2018   TEE WITHOUT CARDIOVERSION  03/22/2012   Procedure:  TRANSESOPHAGEAL ECHOCARDIOGRAM (TEE);  Surgeon: Oneil Parchment, MD;  Location: [facility] ENDOSCOPY;  Service: Cardiovascular;  Laterality: N/A;   TEE WITHOUT CARDIOVERSION N/A 02/08/2016   Procedure: TRANSESOPHAGEAL ECHOCARDIOGRAM (TEE);  Surgeon: Redell GORMAN Shallow, MD;  Location: [facility] ENDOSCOPY;  Service: Cardiovascular;  Laterality: N/A;    Current Outpatient Medications  Medication Sig Dispense Refill   acetaminophen  (TYLENOL ) 500 MG tablet Take 1,000 mg by mouth every 4 (four) hours as needed for moderate pain or headache.     allopurinol  (ZYLOPRIM ) 100 MG tablet Take 1 tablet (100 mg total) by mouth daily. 90 tablet 2   amiodarone  (PACERONE ) 100 MG tablet TAKE 1 TABLET(100 MG) BY MOUTH DAILY 90 tablet 1   apixaban  (ELIQUIS ) 5 MG TABS tablet Take 1 tablet (5 mg total) by mouth 2 (two) times daily. TAKE 1 TABLET(5 MG) BY MOUTH TWICE DAILY 180 tablet 1   atorvastatin  (LIPITOR) 40 MG tablet Take 1 tablet (40 mg total) by  mouth daily. 90 tablet 1   carvedilol  (COREG ) 6.25 MG tablet Take 1 tablet (6.25 mg total) by mouth 2 (two) times daily. 180 tablet 1   cholecalciferol (VITAMIN D3) 25 MCG (1000 UNIT) tablet Take 5,000 Units by mouth daily.     dapagliflozin  propanediol (FARXIGA ) 10 MG TABS tablet TAKE 1 TABLET(10 MG) BY MOUTH DAILY BEFORE BREAKFAST 30 tablet 5   doxycycline (VIBRAMYCIN) 100 MG capsule Take by mouth.     fluticasone  (FLONASE ) 50 MCG/ACT nasal spray Place 2 sprays into both nostrils daily. 16 g 2   furosemide  (LASIX ) 40 MG tablet Take 40 mg by mouth. Every other day     furosemide  (LASIX ) 80 MG tablet Alternate one tablet (80mg ) and half tablet (40mg ) every other day. Okay to take whole tablet as needed for fluid retention, edema. 90 tablet 3   Hypromellose (ARTIFICIAL TEARS OP) Place 1 drop into both eyes daily as needed (dry eyes).     levothyroxine  (SYNTHROID ) 75 MCG tablet Take 1 tablet (75 mcg total) by mouth daily before breakfast. 90 tablet 2   loperamide (IMODIUM) 2 MG capsule Take 2  mg by mouth as needed for diarrhea or loose stools.     Loteprednol-Tobramycin (ZYLET ) 0.5-0.3 % SUSP Place 1 drop into both eyes 4 (four) times daily.     Na Sulfate-K Sulfate-Mg Sulfate concentrate (SUPREP) 17.5-3.13-1.6 GM/177ML SOLN Take 1 kit (354 mLs total) by mouth once for 1 dose. 354 mL 0   neomycin -polymyxin-dexamethasone  (MAXITROL) 0.1 % ophthalmic suspension Place 1 drop into the right eye 4 (four) times daily.     olmesartan (BENICAR) 5 MG tablet Take 5 mg by mouth daily.     potassium chloride  20 MEQ/15ML (10%) SOLN TAKE 15 ML BY MOUTH TWICE DAILY 946 mL 1   rosuvastatin  (CRESTOR ) 20 MG tablet TAKE 1 TABLET(20 MG) BY MOUTH DAILY 90 tablet 2   Semaglutide ,0.25 or 0.5MG /DOS, (OZEMPIC , 0.25 OR 0.5 MG/DOSE,) 2 MG/3ML SOPN INJECT 0.5 MG UNDER THE SKIN ONCE WEEKLY. 3 mL 2   temazepam  (RESTORIL ) 15 MG capsule Take 1 capsule (15 mg total) by mouth at bedtime as needed for sleep. 30 capsule 0   vitamin B-12 (CYANOCOBALAMIN) 100 MCG tablet Take 100 mcg by mouth daily.     vitamin C (ASCORBIC ACID) 500 MG tablet Take 500 mg by mouth daily.     No current facility-administered medications for this visit.    Allergies as of 05/28/2024 - Review Complete 05/28/2024  Allergen Reaction Noted   Lactose Diarrhea 03/19/2012   Lactose intolerance (gi) Diarrhea 03/19/2012    Family History  Problem Relation Age of Onset   Hypertension Mother    Heart disease Mother    Heart failure Mother    Diabetes Mother    Hypertension Father    Heart disease Father    Heart failure Father     Review of Systems:    Constitutional: No weight loss, fever, chills, weakness or fatigue HEENT: Eyes: No change in vision               Ears, Nose, Throat:  No change in hearing or congestion Skin: No rash or itching Cardiovascular: No chest pain, chest pressure or palpitations   Respiratory: No SOB or cough Gastrointestinal: See HPI and otherwise negative Genitourinary: No dysuria or change in urinary  frequency Neurological: No headache, dizziness or syncope Musculoskeletal: No new muscle or joint pain Hematologic: No bleeding or bruising Psychiatric: No history of depression or anxiety  Physical Exam:  Vital signs: BP 122/80   Pulse 87   Wt 191 lb 6 oz (86.8 kg)   BMI 29.10 kg/m   Constitutional:   Pleasant A.A. male appears to be in NAD, Well developed, Well nourished, alert and cooperative Throat: Oral cavity and pharynx without inflammation, swelling or lesion.  Respiratory: Respirations even and unlabored. Lungs clear to auscultation bilaterally.   No wheezes, crackles, or rhonchi.  Cardiovascular: Normal S1, S2. Regular rate and rhythm. No peripheral edema, cyanosis or pallor.  Gastrointestinal:  Soft, nondistended, nontender. No rebound or guarding. Normal bowel sounds. No appreciable masses or hepatomegaly. Rectal:  Not performed.  Msk:  Symmetrical without gross deformities. Without edema, no deformity or joint abnormality.  Neurologic:  Alert and  oriented x4;  grossly normal neurologically.  Skin:   Dry and intact without significant lesions or rashes.  RELEVANT LABS AND IMAGING: CBC    Latest Ref Rng & Units 02/22/2024   11:41 AM 12/28/2023    1:16 PM 10/19/2023   12:13 PM  CBC  WBC 4.0 - 10.5 K/uL 5.2  5.3  4.9   Hemoglobin 13.0 - 17.0 g/dL 87.4  88.1  86.7   Hematocrit 39.0 - 52.0 % 38.9  35.0  39.7   Platelets 150 - 400 K/uL 131  150  175      CMP     Latest Ref Rng & Units 05/05/2024    4:49 PM 02/22/2024   11:41 AM 12/28/2023    1:16 PM  CMP  Glucose 70 - 99 mg/dL 84  897  844   BUN 8 - 27 mg/dL 34  40  34   Creatinine 0.76 - 1.27 mg/dL 7.65  7.65  7.97   Sodium 134 - 144 mmol/L 139  142  140   Potassium 3.5 - 5.2 mmol/L 4.1  4.1  3.4   Chloride 96 - 106 mmol/L 107  108  107   CO2 20 - 29 mmol/L 19  25  23    Calcium  8.6 - 10.2 mg/dL 9.0  8.8  8.3   Total Protein 6.5 - 8.1 g/dL  6.7  5.6   Total Bilirubin 0.0 - 1.2 mg/dL  1.2  1.6   Alkaline Phos  38 - 126 U/L  96  70   AST 15 - 41 U/L  25  23   ALT 0 - 44 U/L  22  16      Lab Results  Component Value Date   TSH 3.960 05/05/2024  Echo  12/26/2022  ([facility]): MODERATE LV DYSFUNCTION (See above) WITH MILD LVH NORMAL RIGHT VENTRICULAR SYSTOLIC FUNCTION VALVULAR REGURGITATION: MILD MR, MILD PR, MILD TR PROSTHETIC VALVE(S): BIOPROSTHETIC AoV THE LVEF IS ESTIMATED AT 40-45%.  GI procedures: 03/2013 colonoscopy with Dr. Debrah, recall 10 years Slight prominence of ileocecal valve.  Small internal hemorrhoids.  Assessment:    73 year old male patient who presents for colon screening colonoscopy , will schedule in hospital with Dr. Stacia. No current GI issues. Will need cardiac clearance of Eliquis . Hold Semaglutide  for 1 week.  History of PAF -on Eliquis   Plan: -Schedule for a colonoscopy in hospital (per Norleen Schillings due to cardiac history) with Dr. Charlanne. The risks and benefits of colonoscopy with possible polypectomy / biopsies were discussed and the patient agrees to proceed.  -Hold semaglutide  1 week  -Cardiac clearance for Eliquis  -Hold Eliquis  2 days before procedure - will instruct when and how to resume after procedure. Risks and benefits of  procedure including bleeding, perforation, infection, missed lesions, medication reactions and possible hospitalization or surgery if complications occur explained. Additional rare but real risk of cardiovascular event such as heart attack or ischemia/infarct of other organs off Eliquis  explained and need to seek urgent help if this occurs. Will communicate by phone or EMR with patient's prescribing provider that to confirm holding Eliquis  is reasonable in this case.    Thank you for the courtesy of this consult. Please call me with any questions or concerns.   Emersyn Wyss, FNP-C [facility] 05/28/2024, 11:25 AM  Cc: Jarold Medici, MD

## 2024-05-29 ENCOUNTER — Other Ambulatory Visit

## 2024-05-29 NOTE — Telephone Encounter (Signed)
   Name: Eric [name]., MD  DOB: 1951/04/26  MRN: [id number]   Primary Cardiologist: Annabella Scarce, MD  Chart reviewed as part of pre-operative protocol coverage.   Per Pharm D, patient has not had an Afib/aflutter ablation or Watchman within the last 3 months or DCCV within the last 30 days. Patient may hold Eliquis  for 2 days prior to procedure. Patient will not need bridging with Lovenox  around procedure.    I will route this recommendation to the requesting party via Epic fax function and remove from pre-op pool. Please call with questions.  Barnie Hila, NP 05/29/2024, 3:44 PM

## 2024-05-29 NOTE — Telephone Encounter (Signed)
 Patient with diagnosis of atrial fibrillation on Eliquis  for anticoagulation.    What type of surgery is being performed?     endoscopic  When is this surgery scheduled?     07/28/24    CHA2DS2-VASc Score = 5   This indicates a 7.2% annual risk of stroke. The patient's score is based upon: CHF History: 1 HTN History: 1 Diabetes History: 1 Stroke History: 0 Vascular Disease History: 1 Age Score: 1 Gender Score: 0    CrCl 35 Platelet count 131  Patient has not had an Afib/aflutter ablation or Watchman within the last 3 months or DCCV within the last 30 days   Per office protocol, patient can hold Eliquis  for 2 days prior to procedure.   Patient will not need bridging with Lovenox  (enoxaparin ) around procedure.  **This guidance is not considered finalized until pre-operative APP has relayed final recommendations.**

## 2024-05-30 ENCOUNTER — Other Ambulatory Visit: Payer: Self-pay

## 2024-05-30 DIAGNOSIS — E782 Mixed hyperlipidemia: Secondary | ICD-10-CM

## 2024-05-31 LAB — LIPID PANEL
Chol/HDL Ratio: 1.9 ratio (ref 0.0–5.0)
Cholesterol, Total: 109 mg/dL (ref 100–199)
HDL: 56 mg/dL (ref >39)
LDL Chol Calc (NIH): 41 mg/dL (ref 0–99)
Triglycerides: 46 mg/dL (ref 0–149)
VLDL Cholesterol Cal: 12 mg/dL (ref 5–40)

## 2024-06-02 ENCOUNTER — Ambulatory Visit: Payer: Self-pay | Admitting: Internal Medicine

## 2024-06-06 ENCOUNTER — Ambulatory Visit: Admitting: Hematology & Oncology

## 2024-06-06 ENCOUNTER — Inpatient Hospital Stay

## 2024-06-10 ENCOUNTER — Ambulatory Visit (INDEPENDENT_AMBULATORY_CARE_PROVIDER_SITE_OTHER): Payer: Medicare Other

## 2024-06-10 DIAGNOSIS — I428 Other cardiomyopathies: Secondary | ICD-10-CM | POA: Diagnosis not present

## 2024-06-11 ENCOUNTER — Encounter: Payer: Self-pay | Admitting: Hematology & Oncology

## 2024-06-11 ENCOUNTER — Inpatient Hospital Stay (HOSPITAL_BASED_OUTPATIENT_CLINIC_OR_DEPARTMENT_OTHER): Admitting: Hematology & Oncology

## 2024-06-11 ENCOUNTER — Inpatient Hospital Stay: Attending: Hematology & Oncology

## 2024-06-11 VITALS — BP 121/82 | HR 92 | Temp 97.7°F | Resp 20 | Ht 68.0 in | Wt 195.0 lb

## 2024-06-11 DIAGNOSIS — C9001 Multiple myeloma in remission: Secondary | ICD-10-CM | POA: Diagnosis not present

## 2024-06-11 DIAGNOSIS — C9000 Multiple myeloma not having achieved remission: Secondary | ICD-10-CM | POA: Diagnosis not present

## 2024-06-11 DIAGNOSIS — Z79899 Other long term (current) drug therapy: Secondary | ICD-10-CM | POA: Insufficient documentation

## 2024-06-11 LAB — CUP PACEART REMOTE DEVICE CHECK
Battery Remaining Longevity: 4 mo
Battery Remaining Percentage: 4 %
Battery Voltage: 2.62 V
Date Time Interrogation Session: 20250916022517
HighPow Impedance: 45 Ohm
HighPow Impedance: 45 Ohm
Implantable Lead Connection Status: 753985
Implantable Lead Connection Status: 753985
Implantable Lead Connection Status: 753985
Implantable Lead Implant Date: 20170525
Implantable Lead Implant Date: 20170525
Implantable Lead Implant Date: 20170525
Implantable Lead Location: 753858
Implantable Lead Location: 753859
Implantable Lead Location: 753860
Implantable Lead Model: 7122
Implantable Pulse Generator Implant Date: 20170525
Lead Channel Impedance Value: 380 Ohm
Lead Channel Impedance Value: 430 Ohm
Lead Channel Impedance Value: 540 Ohm
Lead Channel Pacing Threshold Amplitude: 0.5 V
Lead Channel Pacing Threshold Amplitude: 1.25 V
Lead Channel Pacing Threshold Pulse Width: 0.5 ms
Lead Channel Pacing Threshold Pulse Width: 0.5 ms
Lead Channel Sensing Intrinsic Amplitude: 0.2 mV
Lead Channel Sensing Intrinsic Amplitude: 10.9 mV
Lead Channel Setting Pacing Amplitude: 2 V
Lead Channel Setting Pacing Amplitude: 2.5 V
Lead Channel Setting Pacing Pulse Width: 0.5 ms
Lead Channel Setting Pacing Pulse Width: 0.5 ms
Lead Channel Setting Sensing Sensitivity: 0.5 mV
Pulse Gen Serial Number: 7357926
Zone Setting Status: 755011

## 2024-06-11 LAB — CBC WITH DIFFERENTIAL (CANCER CENTER ONLY)
Abs Immature Granulocytes: 0.01 K/uL (ref 0.00–0.07)
Basophils Absolute: 0 K/uL (ref 0.0–0.1)
Basophils Relative: 1 %
Eosinophils Absolute: 0.2 K/uL (ref 0.0–0.5)
Eosinophils Relative: 3 %
HCT: 36.4 % — ABNORMAL LOW (ref 39.0–52.0)
Hemoglobin: 12 g/dL — ABNORMAL LOW (ref 13.0–17.0)
Immature Granulocytes: 0 %
Lymphocytes Relative: 15 %
Lymphs Abs: 0.8 K/uL (ref 0.7–4.0)
MCH: 30.1 pg (ref 26.0–34.0)
MCHC: 33 g/dL (ref 30.0–36.0)
MCV: 91.2 fL (ref 80.0–100.0)
Monocytes Absolute: 0.9 K/uL (ref 0.1–1.0)
Monocytes Relative: 18 %
Neutro Abs: 3.3 K/uL (ref 1.7–7.7)
Neutrophils Relative %: 63 %
Platelet Count: 162 K/uL (ref 150–400)
RBC: 3.99 MIL/uL — ABNORMAL LOW (ref 4.22–5.81)
RDW: 17.3 % — ABNORMAL HIGH (ref 11.5–15.5)
WBC Count: 5.2 K/uL (ref 4.0–10.5)
nRBC: 0 % (ref 0.0–0.2)

## 2024-06-11 LAB — CMP (CANCER CENTER ONLY)
ALT: 18 U/L (ref 0–44)
AST: 31 U/L (ref 15–41)
Albumin: 3.9 g/dL (ref 3.5–5.0)
Alkaline Phosphatase: 94 U/L (ref 38–126)
Anion gap: 13 (ref 5–15)
BUN: 33 mg/dL — ABNORMAL HIGH (ref 8–23)
CO2: 22 mmol/L (ref 22–32)
Calcium: 8.9 mg/dL (ref 8.9–10.3)
Chloride: 108 mmol/L (ref 98–111)
Creatinine: 2.29 mg/dL — ABNORMAL HIGH (ref 0.61–1.24)
GFR, Estimated: 30 mL/min — ABNORMAL LOW (ref >60)
Glucose, Bld: 99 mg/dL (ref 70–99)
Potassium: 4 mmol/L (ref 3.5–5.1)
Sodium: 142 mmol/L (ref 135–145)
Total Bilirubin: 1.3 mg/dL — ABNORMAL HIGH (ref 0.0–1.2)
Total Protein: 6.3 g/dL — ABNORMAL LOW (ref 6.5–8.1)

## 2024-06-11 LAB — LACTATE DEHYDROGENASE: LDH: 222 U/L — ABNORMAL HIGH (ref 98–192)

## 2024-06-11 NOTE — Progress Notes (Signed)
 Hematology and Oncology Follow Up Visit  Eric [name]., MD [id number] 07-08-1951 73 y.o. 06/11/2024   Principle Diagnosis:  IgG kappa myeloma  - 1q+   Current Therapy:        CyBorD -- s/p cycle  #19 - started on 06/04/2020 --last treatment in 08/2022 --on hold for now.   Interim History:  Eric [name] is here today for follow-up.  I think that we last saw him back in May.  Since then, as always, he has been working.  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  His wife passed away I think in 2024-01-07.  I know he is still getting through this.  His son comes up from [place]  to help.  This certainly is helping him quite a bit.  It has now been over a year and a half since he had treatment for the myeloma.  So far, we have had no evidence of recurrence.  When we last saw him, there was no monoclonal spike in his blood.  His IgG level was 1066 mg/dL.  His kappa light chain was 3.7 mg/dL.    There is been no problems with pain.  He has had no issues with infections.  He has had no rashes.  He has had no leg swelling.  There is no change in bowel or bladder habits.  He has had no cough or shortness of breath.  Overall, I would say that his performance status is probably ECOG 0.     Medications:  Allergies as of 06/11/2024       Reactions   Lactose Diarrhea   Lactose Intolerance (gi) Diarrhea        Medication List        Accurate as of June 11, 2024 10:02 AM. If you have any questions, ask your nurse or doctor.          STOP taking these medications    rosuvastatin  20 MG tablet Commonly known as: CRESTOR  Stopped by: Eric [name]   Zylet  0.5-0.3 % Susp Generic drug: Loteprednol-Tobramycin Stopped by: Eric [name]       TAKE these medications    acetaminophen  500 MG tablet Commonly known as: TYLENOL  Take 1,000 mg by mouth every 4 (four) hours as needed for moderate pain or headache.   allopurinol  100 MG tablet Commonly known  as: Zyloprim  Take 1 tablet (100 mg total) by mouth daily.   amiodarone  100 MG tablet Commonly known as: PACERONE  TAKE 1 TABLET(100 MG) BY MOUTH DAILY   apixaban  5 MG Tabs tablet Commonly known as: Eliquis  Take 1 tablet (5 mg total) by mouth 2 (two) times daily. TAKE 1 TABLET(5 MG) BY MOUTH TWICE DAILY   ARTIFICIAL TEARS OP Place 1 drop into both eyes daily as needed (dry eyes).   ascorbic acid 500 MG tablet Commonly known as: VITAMIN C Take 500 mg by mouth daily.   atorvastatin  40 MG tablet Commonly known as: Lipitor Take 1 tablet (40 mg total) by mouth daily.   carvedilol  6.25 MG tablet Commonly known as: COREG  Take 1 tablet (6.25 mg total) by mouth 2 (two) times daily.   cholecalciferol 25 MCG (1000 UNIT) tablet Commonly known as: VITAMIN D3 Take 5,000 Units by mouth daily.   dapagliflozin  propanediol 10 MG Tabs tablet Commonly known as: FARXIGA  TAKE 1 TABLET(10 MG) BY MOUTH DAILY BEFORE BREAKFAST   doxycycline 100 MG capsule Commonly known as: VIBRAMYCIN Take by mouth.   fluticasone  50 MCG/ACT nasal spray  Commonly known as: FLONASE  Place 2 sprays into both nostrils daily. What changed:  when to take this reasons to take this   furosemide  80 MG tablet Commonly known as: LASIX  Alternate one tablet (80mg ) and half tablet (40mg ) every other day. Okay to take whole tablet as needed for fluid retention, edema. What changed: Another medication with the same name was removed. Continue taking this medication, and follow the directions you see here. Changed by: Eric [name]   levothyroxine  75 MCG tablet Commonly known as: SYNTHROID  Take 1 tablet (75 mcg total) by mouth daily before breakfast.   loperamide 2 MG capsule Commonly known as: IMODIUM Take 2 mg by mouth as needed for diarrhea or loose stools.   neomycin -polymyxin b-dexamethasone  3.5-10000-0.1 Oint Commonly known as: MAXITROL 2 (two) times daily.   neomycin -polymyxin-dexamethasone  0.1 % ophthalmic  suspension Commonly known as: MAXITROL Place 1 drop into the right eye 4 (four) times daily.   olmesartan 5 MG tablet Commonly known as: BENICAR Take 5 mg by mouth daily.   Ozempic  (0.25 or 0.5 MG/DOSE) 2 MG/3ML Sopn Generic drug: Semaglutide (0.25 or 0.5MG /DOS) INJECT 0.5 MG UNDER THE SKIN ONCE WEEKLY.   potassium chloride  20 MEQ/15ML (10%) Soln TAKE 15 ML BY MOUTH TWICE DAILY   temazepam  15 MG capsule Commonly known as: RESTORIL  Take 1 capsule (15 mg total) by mouth at bedtime as needed for sleep.   vitamin B-12 100 MCG tablet Commonly known as: CYANOCOBALAMIN Take 100 mcg by mouth daily.        Allergies:  Allergies  Allergen Reactions   Lactose Diarrhea   Lactose Intolerance (Gi) Diarrhea    Past Medical History, Surgical history, Social history, and Family History were reviewed and updated.  Review of Systems: Review of Systems  Constitutional: Negative.   Eyes: Negative.   Respiratory: Negative.    Cardiovascular:  Positive for leg swelling.  Gastrointestinal: Negative.   Genitourinary: Negative.   Musculoskeletal: Negative.   Skin: Negative.   Neurological: Negative.   Endo/Heme/Allergies: Negative.   Psychiatric/Behavioral: Negative.       Physical Exam:  Temperature is 97.7.  Pulse 92.  Blood pressure 121/82.  Weight is 195 pounds.    Wt Readings from Last 3 Encounters:  06/11/24 195 lb (88.5 kg)  05/28/24 191 lb 6 oz (86.8 kg)  05/05/24 192 lb 12.8 oz (87.5 kg)    Physical Exam Vitals reviewed.  HENT:     Head: Normocephalic and atraumatic.  Eyes:     Pupils: Pupils are equal, round, and reactive to light.  Cardiovascular:     Rate and Rhythm: Normal rate and regular rhythm.     Heart sounds: Normal heart sounds.  Pulmonary:     Effort: Pulmonary effort is normal.     Breath sounds: Normal breath sounds.  Abdominal:     General: Bowel sounds are normal.     Palpations: Abdomen is soft.  Musculoskeletal:        General: No  tenderness or deformity. Normal range of motion.     Cervical back: Normal range of motion.  Lymphadenopathy:     Cervical: No cervical adenopathy.  Skin:    General: Skin is warm and dry.     Findings: No erythema or rash.  Neurological:     Mental Status: He is alert and oriented to person, place, and time.  Psychiatric:        Behavior: Behavior normal.        Thought Content: Thought content normal.  Judgment: Judgment normal.     Lab Results  Component Value Date   WBC 5.2 06/11/2024   HGB 12.0 (L) 06/11/2024   HCT 36.4 (L) 06/11/2024   MCV 91.2 06/11/2024   PLT 162 06/11/2024   Lab Results  Component Value Date   FERRITIN 56 09/15/2022   IRON 92 09/15/2022   TIBC 349 09/15/2022   UIBC 257 09/15/2022   IRONPCTSAT 26 09/15/2022   Lab Results  Component Value Date   RETICCTPCT 1.4 09/15/2022   RBC 3.99 (L) 06/11/2024   Lab Results  Component Value Date   KPAFRELGTCHN 37.4 (H) 02/22/2024   LAMBDASER 31.9 (H) 02/22/2024   KAPLAMBRATIO 1.17 02/22/2024   Lab Results  Component Value Date   IGGSERUM 1,066 02/22/2024   IGA 244 02/22/2024   IGMSERUM 29 02/22/2024   Lab Results  Component Value Date   TOTALPROTELP 6.1 02/22/2024   ALBUMINELP 3.3 02/22/2024   A1GS 0.3 02/22/2024   A2GS 0.6 02/22/2024   BETS 1.1 02/22/2024   GAMS 0.9 02/22/2024   MSPIKE Not Observed 02/22/2024   SPEI Comment 04/22/2021     Chemistry      Component Value Date/Time   NA 142 06/11/2024 0918   NA 139 05/05/2024 1649   K 4.0 06/11/2024 0918   CL 108 06/11/2024 0918   CO2 22 06/11/2024 0918   BUN 33 (H) 06/11/2024 0918   BUN 34 (H) 05/05/2024 1649   CREATININE 2.29 (H) 06/11/2024 0918   CREATININE 1.95 (H) 09/08/2016 0859      Component Value Date/Time   CALCIUM  8.9 06/11/2024 0918   ALKPHOS 94 06/11/2024 0918   AST 31 06/11/2024 0918   ALT 18 06/11/2024 0918   BILITOT 1.3 (H) 06/11/2024 0918       Impression and Plan: 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  He has done incredibly well with treatment.  Again, he has been off treatment now for over  a year and a half.  His monoclonal studies have all looked pretty stable.  I would not imagine that anything will change right now.  We will not get him through the Holiday season.  I will plan to see him back next year.  Eric JONELLE Crease, MD 9/17/202510:02 AM

## 2024-06-12 LAB — IGG, IGA, IGM
IgA: 252 mg/dL (ref 61–437)
IgG (Immunoglobin G), Serum: 1043 mg/dL (ref 603–1613)
IgM (Immunoglobulin M), Srm: 30 mg/dL (ref 15–143)

## 2024-06-12 LAB — KAPPA/LAMBDA LIGHT CHAINS
Kappa free light chain: 41.2 mg/L — ABNORMAL HIGH (ref 3.3–19.4)
Kappa, lambda light chain ratio: 1 (ref 0.26–1.65)
Lambda free light chains: 41.4 mg/L — ABNORMAL HIGH (ref 5.7–26.3)

## 2024-06-13 ENCOUNTER — Ambulatory Visit: Payer: Self-pay | Admitting: Internal Medicine

## 2024-06-13 LAB — PROTEIN ELECTROPHORESIS, SERUM, WITH REFLEX
A/G Ratio: 1.2 (ref 0.7–1.7)
Albumin ELP: 3.2 g/dL (ref 2.9–4.4)
Alpha-1-Globulin: 0.3 g/dL (ref 0.0–0.4)
Alpha-2-Globulin: 0.5 g/dL (ref 0.4–1.0)
Beta Globulin: 1 g/dL (ref 0.7–1.3)
Gamma Globulin: 0.9 g/dL (ref 0.4–1.8)
Globulin, Total: 2.7 g/dL (ref 2.2–3.9)
Total Protein ELP: 5.9 g/dL — ABNORMAL LOW (ref 6.0–8.5)

## 2024-06-16 NOTE — Progress Notes (Signed)
Remote ICD Transmission.

## 2024-06-22 NOTE — Therapy (Signed)
 OUTPATIENT PHYSICAL THERAPY EVALUATION   Patient Name: Eric [name]., MD MRN: [id number] DOB:1950-10-18, 73 y.o., male Today's Date: 06/23/2024   PCP:  Catheryn Slocumb, MD  REFERRING PROVIDER: Catheryn Slocumb, MD   END OF SESSION:  PT End of Session - 06/23/24 1006     Visit Number 1    Number of Visits 17    Date for Recertification  08/18/24    Authorization Type none    PT Start Time 0906    PT Stop Time 0950    PT Time Calculation (min) 44 min    Activity Tolerance Patient tolerated treatment well    Behavior During Therapy [facility] for tasks assessed/performed           Past Medical History:  Diagnosis Date   Acute blood loss anemia    Acute encephalopathy    Acute hypoxemic respiratory failure (HCC)    Acute idiopathic gout of right ankle    Acute lumbar back pain    Acute on chronic systolic heart failure (HCC)    Acute respiratory failure (HCC)    AKI (acute kidney injury)    Altered mental status    Aortic aneurysm without rupture 02/09/2016   Aortic root enlargement 02/13/2012   Aortic valve regurgitation, acquired 02/13/2012   Arrhythmia    Atrial flutter (HCC) 03/18/2012   Back pain    Cardiac arrest (HCC) 02/01/2016   CHF (congestive heart failure) (HCC)    Chronic anticoagulation 03/04/2013   Chronic combined systolic and diastolic heart failure (HCC)    Chronic kidney disease    kidney fx studies increased    Chronic lower back pain    Chronic renal insufficiency    Chronic systolic heart failure (HCC) 02/13/2012   Recent diagnosis 4 / 2013, LVEF 25% by Echo 12/2011  03/2013: Echo at [facility] Cardiology Conclusions: 1. Left ventricular ejection fraction estimated by 2D at 40-45 percent. 2. Mild concentric left ventricular hypertrophy. 3. Mild left atrial enlargement. 4. Moderate aortic valve regurgitation. 5. The aortic root at the sinus(es) of valsalva is moderately dilated 6. Mild mitral valve regurgitation. 7.   CKD (chronic kidney disease)    CKD  (chronic kidney disease), stage IV (HCC) 08/06/2013   Creatinine 2.4 on 07/04/13    Claustrophobia    Colon cancer screening 03/04/2013   Debility 02/22/2016   Diabetes mellitus type 2 in nonobese [facility])    Diabetes mellitus type 2 in obese    Dysrhythmia    palpitations   Encounter for central line placement    Exertional dyspnea 01/2012   Femoral nerve injury 02/22/2016   Femoral neuropathy    Goals of care, counseling/discussion 05/05/2020   HCAP (healthcare-associated pneumonia)    Heart murmur    Hyperlipidemia 02/13/2012   Hypertension    Hypothyroidism    Internal hemorrhoids without mention of complication 04/11/2013   Labile blood pressure    Left bundle branch block 02/13/2012   Leg weakness, bilateral    Long term (current) use of anticoagulants 08/06/2013   Eliquis  therapy    Long term current use of amiodarone  08/07/2016   Lower extremity weakness    Migraine 02/13/2012   opthalmic   Multiple myeloma (HCC) 05/05/2020   Non-traumatic rhabdomyolysis    Obesity (BMI 30-39.9) 02/13/2012   Pain    Paroxysmal atrial fibrillation (HCC)    Pneumonia    Retroperitoneal bleed    Right ankle pain    Right knee pain    Severe aortic regurgitation 02/09/2016  Special screening for malignant neoplasms, colon 04/11/2013   Thyroid  activity decreased    Tibial pain    Varicose vein of leg    right   Ventricular fibrillation (HCC) 02/09/2016   Weakness of both lower extremities    Past Surgical History:  Procedure Laterality Date   AORTIC VALVE REPLACEMENT  11/20/2018   CARDIAC CATHETERIZATION N/A 02/17/2016   Procedure: Left Heart Cath and Coronary Angiography;  Surgeon: Candyce GORMAN Reek, MD;  Location: [facility] INVASIVE CV LAB;  Service: Cardiovascular;  Laterality: N/A;   CARDIOVERSION  03/22/2012   Procedure: CARDIOVERSION;  Surgeon: Oneil Parchment, MD;  Location: [facility] ENDOSCOPY;  Service: Cardiovascular;  Laterality: N/A;   CARDIOVERSION  04/19/2012   Procedure:  CARDIOVERSION;  Surgeon: Victory LELON Claudene DOUGLAS, MD;  Location: [facility] OR;  Service: Cardiovascular;  Laterality: N/A;   COLONOSCOPY N/A 04/11/2013   Procedure: COLONOSCOPY;  Surgeon: Lamar JONETTA Aho, MD;  Location: [facility] ENDOSCOPY;  Service: Endoscopy;  Laterality: N/A;   COLONOSCOPY N/A 04/11/2013   Procedure: COLONOSCOPY;  Surgeon: Lamar JONETTA Aho, MD;  Location: [facility] ENDOSCOPY;  Service: Endoscopy;  Laterality: N/A;   EP IMPLANTABLE DEVICE N/A 02/17/2016   Procedure: BiV ICD Insertion CRT-D;  Surgeon: Danelle LELON Birmingham, MD;  Location: [facility] INVASIVE CV LAB;  Service: Cardiovascular;  Laterality: N/A;   FINGER SURGERY  2012   4th digit right hand; thumb on left hand   RADIOLOGY WITH ANESTHESIA N/A 02/11/2016   Procedure: MRI OF THE BRAIN WITHOUT CONTRAST, LUMBAR WITHOUT CONTRAST;  Surgeon: Medication Radiologist, MD;  Location: [facility] OR;  Service: Radiology;  Laterality: N/A;  DR. WOOD/MRI   RIGHT/LEFT HEART CATH AND CORONARY ANGIOGRAPHY N/A 07/26/2018   Procedure: RIGHT/LEFT HEART CATH AND CORONARY ANGIOGRAPHY;  Surgeon: Claudene Victory LELON, MD;  Location: [facility] INVASIVE CV LAB;  Service: Cardiovascular;  Laterality: N/A;   Skin melanocytoma excision  2012   above left clavicle   STERNAL INCISION RECLOSURE  11/2018   STERNAL WIRE REMOVAL  11/2018   STERNAL WOUND DEBRIDEMENT  11/2018   TEE WITHOUT CARDIOVERSION  03/22/2012   Procedure: TRANSESOPHAGEAL ECHOCARDIOGRAM (TEE);  Surgeon: Oneil Parchment, MD;  Location: [facility] ENDOSCOPY;  Service: Cardiovascular;  Laterality: N/A;   TEE WITHOUT CARDIOVERSION N/A 02/08/2016   Procedure: TRANSESOPHAGEAL ECHOCARDIOGRAM (TEE);  Surgeon: Redell GORMAN Shallow, MD;  Location: [facility] ENDOSCOPY;  Service: Cardiovascular;  Laterality: N/A;   Patient Active Problem List   Diagnosis Date Noted   Umbilical hernia without obstruction and without gangrene 05/11/2024   Chalazion of right upper eyelid 05/11/2024   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 05/11/2024   Venous stasis  05/11/2024   Adjustment insomnia 01/01/2024   Presence of heart assist device (HCC) 12/31/2023   Enlarged prostate 09/10/2023   Dyslipidemia due to type 2 diabetes mellitus (HCC) 09/10/2023   Acquired thrombophilia 05/14/2023   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   Goals of care, counseling/discussion 05/05/2020   Multiple myeloma (HCC) 05/05/2020   S/P flap graft 01/07/2019   Coronary artery disease 01/07/2019   Long term (current) use of antibiotics 01/03/2019   Essential hypertension 12/27/2018   Postoperative infection of wound of sternum 12/27/2018   Decreased strength, endurance, and mobility 12/27/2018   Cardiac LV ejection fraction 30-35% 12/27/2018   Decreased activities of daily living (ADL) 12/27/2018   Klebsiella infection 12/27/2018   S/P AVR (aortic valve replacement) 11/23/2018   S/P ascending aortic aneurysm repair 11/23/2018   NICM (nonischemic cardiomyopathy) (HCC) 11/19/2018   AICD (automatic  cardioverter/defibrillator) present 11/19/2018   Other symptoms and signs involving the musculoskeletal system 05/08/2018   Long term current use of amiodarone  08/07/2016   Type 2 diabetes mellitus with stage 3b chronic kidney disease, without long-term current use of insulin  (HCC)    Femoral neuropathy    Leg weakness, bilateral    PAF (paroxysmal atrial fibrillation) (HCC)    Thyroid  activity decreased    Arrhythmia    Long term current use of anticoagulant 03/04/2013   Atrial flutter (HCC) 03/18/2012    Class: Acute   Chronic combined systolic and diastolic CHF (congestive heart failure) (HCC) 02/13/2012    Class: Acute   Hypertension, accelerated 02/13/2012   Hyperlipidemia 02/13/2012   Left bundle branch block 02/13/2012   Obesity (BMI 30-39.9) 02/13/2012    ONSET DATE:   REFERRING DIAG:  Diagnosis R53.1,Z74.09,R68.89 (ICD-10-CM) - Decreased strength, endurance, and mobility   THERAPY DIAG:  Unsteadiness on feet  Muscle weakness  (generalized)  Other abnormalities of gait and mobility  Rationale for Evaluation and Treatment: Rehabilitation  SUBJECTIVE:                                                                                                                                                                                             SUBJECTIVE STATEMENT: I have peripheral neuropathy in the Rt leg.  I am also noticing some weakness in both of the arms maybe from taking the ozempic .  I may want to do more aquatic therapy.  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  I have tingling on the inner groin, knee and front of the leg.  Nothing really in the foot.   I am playing 9 holes of golf lately.  I walk at home.  I go back to the [employer] to ride the bike.    PERTINENT HISTORY: , atrial fibrillation, cardiac arrest s/p ICD, CAD, severe aortic regurgitation s/p AVR 10/2018 and aortic root replacement with CABGx1, aortic dilation, hypertension, hypothyroidism, type 2 diabetes, multiple myeloma, CKD IIIb-IV, migraine  PAIN:  Are you having pain? Yes: NPRS scale: no I mainly get some tingling in the leg that just feels weird   PRECAUTIONS: Fall and ICD/Pacemaker  RED FLAGS: None   WEIGHT BEARING RESTRICTIONS: No  FALLS: Has patient fallen in last 6 months? No  LIVING ENVIRONMENT: Lives with: alone  Lives in: House/apartment Stairs: 4 steps to enter; 3 story home Has following equipment at home: Shower bench, and raised commode  PLOF: Independent;  working part time as Ophthalmologist- requires sitting, standing, walking   PATIENT GOALS: improve R LE strength  and bil UE strength   OBJECTIVE:  Note: Objective measures were completed at Evaluation unless otherwise noted.  DIAGNOSTIC FINDINGS: none recent  COGNITION: Overall cognitive status: Within functional limits for tasks assessed   SENSATION: Intact in B LEs to light touch   POSTURE: rounded shoulders and  forward head  LOWER EXTREMITY ROM:     Active  Right Eval Left Eval  Hip flexion    Hip extension    Hip abduction    Hip adduction    Hip internal rotation    Hip external rotation    Knee flexion    Knee extension    Ankle dorsiflexion    Ankle plantarflexion    Ankle inversion    Ankle eversion     (Blank rows = not tested)  LOWER EXTREMITY MMT:    MMT Right Eval Left Eval  Hip flexion 3+ 4  Hip extension    Hip abduction 4+ 4+  Hip adduction 4 4  Hip internal rotation 4+ 4+  Hip external rotation 4- 4  Knee flexion 5 5  Knee extension 4- 4+  Ankle dorsiflexion 5 5  Ankle plantarflexion 4+ 4+  Ankle inversion    Ankle eversion    (Blank rows = not tested)  UPPER EXTREMITY MMT:  MMT Right eval Left eval  Shoulder flexion 4 4  Shoulder extension    Shoulder abduction 4 4  Shoulder adduction    Shoulder extension    Shoulder internal rotation 5 5  Shoulder external rotation 5 5  Middle trapezius    Lower trapezius    Elbow flexion 4+ 5  Elbow extension 4+ 4+  Wrist flexion    Wrist extension    Wrist ulnar deviation    Wrist radial deviation    Wrist pronation    Wrist supination    Grip strength 58 40   (Blank rows = not tested)  GAIT: Gait pattern: Significant R-ward trunk lean with gait; slightly slowed gait speed  Assistive device utilized: None Level of assistance: Modified independence  FUNCTIONAL TESTS:  5x sit to stand:   walk: able to walk 562ft stopping due to the Rt leg feeling tight.  SOB level 4/10  SL stance:  Rt: Lt:  TODAY'S TREATMENT:                                                                                                                              DATE:  06/23/24 Discussed POC including aquatics  PATIENT EDUCATION: Education details: per today's note Person educated: Patient Education method: Programmer, multimedia, Demonstration, Actor cues, Verbal cues, and Handouts Education comprehension: verbalized  understanding and returned demonstration  HOME EXERCISE PROGRAM: Access Code: [email or web address] URL: [email or web address] Date: 06/23/2024 Prepared by: Saddie Raw  Exercises - Standing Hip Abduction with Counter Support  - 1 x daily - 5 x weekly - 2 sets - 10 reps - Side Stepping with Resistance at Ankles and Counter Support  -  1 x daily - 5 x weekly - 1 sets - 3-5 reps - Standing Hip Extension with Counter Support  - 1 x daily - 5 x weekly - 1-3 sets - 10 reps - 20-30 seconds hold - Seated Long Arc Quad with Ankle Weight  - 1 x daily - 5 x weekly - 2 sets - 10 reps - Sit to Stand  - 1 x daily - 5 x weekly - 2 sets - 10 reps - Seated Hamstring Stretch  - 1 x daily - 7 x weekly - 1-3 sets - 3 reps - 20-30 seconds hold  GOALS: Goals reviewed with patient? Yes  SHORT TERM GOALS: Target date: 06/23/24/2024  Patient to be independent with initial HEP. Baseline: HEP initiated Goal status: MET from previous PT sessions    LONG TERM GOALS: Target date: 08/18/24/2024  Patient to be independent with advanced HEP. Baseline: Not yet initiated  Goal status: INITIAL  Patient to demonstrate Rt LE strength >/=4/5.  Baseline: See above Goal status: INITIAL  -    Pt will improve his walk test to being able to walk the full without rest and reaching at least 1016ft  Baseline: 5102ft     ASSESSMENT:  CLINICAL IMPRESSION: Patient is a 73 y/o M presenting to OPPT with c/o R LE weakness and feelings of upper body weakness after neuropathy x several years after MI treatment and now in the upper body after ozempic  use. Pt noted improvements in his Rt LE after PT visits last year which included aquatic therapy.  He has weakness of the Rt leg especially proximally and was limited in his walk test due to stiffness in the Rt leg happening after walking x and requiring a sit break.  Break x and then he was able to walk the rest of the way down the hall.  Pt does report that  his balance seems fine and better compared to last visits and that he only feels a bit off balance when getting up at night.   Would benefit from skilled PT services 2 x/week for 8 weeks to address aforementioned impairments in order to optimize level of function.   OBJECTIVE IMPAIRMENTS: Abnormal gait, decreased balance, difficulty walking, decreased ROM, decreased strength, impaired flexibility, impaired sensation, postural dysfunction, and pain.   ACTIVITY LIMITATIONS: carrying, lifting, bending, standing, squatting, stairs, transfers, bathing, toileting, dressing, locomotion level, and caring for others  PARTICIPATION LIMITATIONS: meal prep, cleaning, laundry, shopping, community activity, occupation, yard work, and church  PERSONAL FACTORS: Age, Past/current experiences, Time since onset of injury/illness/exacerbation, and 3+ comorbidities: HFrEF, atrial fibrillation, VF cardiac arrest s/p ICD, CAD, severe aortic regurgitation s/p AVR 10/2018 and aortic root replacement with CABGx1, aortic dilation, hypertension, hypothyroidism, type 2 diabetes, multiple myeloma, CKD IIIb-IV, migraine are also affecting patient's functional outcome.   REHAB POTENTIAL: Good  CLINICAL DECISION MAKING: LOW  EVALUATION COMPLEXITY: LOW  PLAN:  PT FREQUENCY: 2x/week  PT DURATION: 8 weeks   PLANNED INTERVENTIONS: [id number]- PT Re-evaluation, 97110-Therapeutic exercises, 97530- Therapeutic activity, 97112- Neuromuscular re-education, 97535- Self Care, [id number]- Manual therapy, [phone]- Gait training, [phone]- Canalith repositioning, [phone]- Aquatic Therapy, Patient/Family education, Balance training, Stair training, Taping, Dry Needling, Joint mobilization, Spinal mobilization, Vestibular training, DME instructions, Cryotherapy, and Moist heat  PLAN FOR NEXT SESSION: general strength like nustep, Rt LE general but hip focus, and general bil UE (no sig weakness here)    Saddie Raw, PT 06/23/24, 10:20 AM

## 2024-06-23 ENCOUNTER — Ambulatory Visit: Attending: Internal Medicine | Admitting: Rehabilitation

## 2024-06-23 ENCOUNTER — Other Ambulatory Visit: Payer: Self-pay

## 2024-06-23 ENCOUNTER — Encounter: Payer: Self-pay | Admitting: Rehabilitation

## 2024-06-23 DIAGNOSIS — R6889 Other general symptoms and signs: Secondary | ICD-10-CM | POA: Insufficient documentation

## 2024-06-23 DIAGNOSIS — M6281 Muscle weakness (generalized): Secondary | ICD-10-CM | POA: Diagnosis not present

## 2024-06-23 DIAGNOSIS — R2681 Unsteadiness on feet: Secondary | ICD-10-CM | POA: Diagnosis not present

## 2024-06-23 DIAGNOSIS — R531 Weakness: Secondary | ICD-10-CM | POA: Insufficient documentation

## 2024-06-23 DIAGNOSIS — R2689 Other abnormalities of gait and mobility: Secondary | ICD-10-CM | POA: Diagnosis not present

## 2024-06-23 DIAGNOSIS — Z7409 Other reduced mobility: Secondary | ICD-10-CM | POA: Diagnosis not present

## 2024-06-25 ENCOUNTER — Encounter: Payer: Self-pay | Admitting: Rehabilitation

## 2024-06-25 ENCOUNTER — Ambulatory Visit: Attending: Internal Medicine | Admitting: Rehabilitation

## 2024-06-25 DIAGNOSIS — R2689 Other abnormalities of gait and mobility: Secondary | ICD-10-CM | POA: Insufficient documentation

## 2024-06-25 DIAGNOSIS — M6281 Muscle weakness (generalized): Secondary | ICD-10-CM | POA: Diagnosis not present

## 2024-06-25 DIAGNOSIS — R2681 Unsteadiness on feet: Secondary | ICD-10-CM | POA: Diagnosis not present

## 2024-06-25 NOTE — Therapy (Signed)
 OUTPATIENT PHYSICAL THERAPY TREATMENT   Patient Name: Eric Cordaro., MD MRN: [id number] DOB:February 27, 1951, 73 y.o., male Today's Date: 06/25/2024   PCP:  Catheryn Slocumb, MD  REFERRING PROVIDER: Catheryn Slocumb, MD   END OF SESSION:  PT End of Session - 06/25/24 0915     Visit Number 2    Number of Visits 17    Date for Recertification  08/18/24    Authorization Type none    PT Start Time 0913    PT Stop Time 0959    PT Time Calculation (min) 46 min    Activity Tolerance Patient tolerated treatment well    Behavior During Therapy [facility] for tasks assessed/performed            Past Medical History:  Diagnosis Date   Acute blood loss anemia    Acute encephalopathy    Acute hypoxemic respiratory failure (HCC)    Acute idiopathic gout of right ankle    Acute lumbar back pain    Acute on chronic systolic heart failure (HCC)    Acute respiratory failure (HCC)    AKI (acute kidney injury)    Altered mental status    Aortic aneurysm without rupture 02/09/2016   Aortic root enlargement 02/13/2012   Aortic valve regurgitation, acquired 02/13/2012   Arrhythmia    Atrial flutter (HCC) 03/18/2012   Back pain    Cardiac arrest (HCC) 02/01/2016   CHF (congestive heart failure) (HCC)    Chronic anticoagulation 03/04/2013   Chronic combined systolic and diastolic heart failure (HCC)    Chronic kidney disease    kidney fx studies increased    Chronic lower back pain    Chronic renal insufficiency    Chronic systolic heart failure (HCC) 02/13/2012   Recent diagnosis 4 / 2013, LVEF 25% by Echo 12/2011  03/2013: Echo at [facility] Conclusions: 1. Left ventricular ejection fraction estimated by 2D at 40-45 percent. 2. Mild concentric left ventricular hypertrophy. 3. Mild left atrial enlargement. 4. Moderate aortic valve regurgitation. 5. The aortic root at the sinus(es) of valsalva is moderately dilated 6. Mild mitral valve regurgitation. 7.   CKD (chronic kidney disease)    CKD  (chronic kidney disease), stage IV (HCC) 08/06/2013   Creatinine 2.4 on 07/04/13    Claustrophobia    Colon cancer screening 03/04/2013   Debility 02/22/2016   Diabetes mellitus type 2 in nonobese [facility])    Diabetes mellitus type 2 in obese    Dysrhythmia    palpitations   Encounter for central line placement    Exertional dyspnea 01/2012   Femoral nerve injury 02/22/2016   Femoral neuropathy    Goals of care, counseling/discussion 05/05/2020   HCAP (healthcare-associated pneumonia)    Heart murmur    Hyperlipidemia 02/13/2012   Hypertension    Hypothyroidism    Internal hemorrhoids without mention of complication 04/11/2013   Labile blood pressure    Left bundle branch block 02/13/2012   Leg weakness, bilateral    Long term (current) use of anticoagulants 08/06/2013   Eliquis  therapy    Long term current use of amiodarone  08/07/2016   Lower extremity weakness    Migraine 02/13/2012   opthalmic   Multiple myeloma (HCC) 05/05/2020   Non-traumatic rhabdomyolysis    Obesity (BMI 30-39.9) 02/13/2012   Pain    Paroxysmal atrial fibrillation (HCC)    Pneumonia    Retroperitoneal bleed    Right ankle pain    Right knee pain    Severe aortic regurgitation  02/09/2016   Special screening for malignant neoplasms, colon 04/11/2013   Thyroid  activity decreased    Tibial pain    Varicose vein of leg    right   Ventricular fibrillation (HCC) 02/09/2016   Weakness of both lower extremities    Past Surgical History:  Procedure Laterality Date   AORTIC VALVE REPLACEMENT  11/20/2018   CARDIAC CATHETERIZATION N/A 02/17/2016   Procedure: Left Heart Cath and Coronary Angiography;  Surgeon: Candyce GORMAN Reek, MD;  Location: [facility] INVASIVE CV LAB;  Service: Cardiovascular;  Laterality: N/A;   CARDIOVERSION  03/22/2012   Procedure: CARDIOVERSION;  Surgeon: Oneil Parchment, MD;  Location: [facility] ENDOSCOPY;  Service: Cardiovascular;  Laterality: N/A;   CARDIOVERSION  04/19/2012   Procedure:  CARDIOVERSION;  Surgeon: Victory LELON Claudene DOUGLAS, MD;  Location: [facility] OR;  Service: Cardiovascular;  Laterality: N/A;   COLONOSCOPY N/A 04/11/2013   Procedure: COLONOSCOPY;  Surgeon: Lamar JONETTA Aho, MD;  Location: [facility] ENDOSCOPY;  Service: Endoscopy;  Laterality: N/A;   COLONOSCOPY N/A 04/11/2013   Procedure: COLONOSCOPY;  Surgeon: Lamar JONETTA Aho, MD;  Location: [facility] ENDOSCOPY;  Service: Endoscopy;  Laterality: N/A;   EP IMPLANTABLE DEVICE N/A 02/17/2016   Procedure: BiV ICD Insertion CRT-D;  Surgeon: Danelle LELON Birmingham, MD;  Location: [facility] INVASIVE CV LAB;  Service: Cardiovascular;  Laterality: N/A;   FINGER SURGERY  2012   4th digit right hand; thumb on left hand   RADIOLOGY WITH ANESTHESIA N/A 02/11/2016   Procedure: MRI OF THE BRAIN WITHOUT CONTRAST, LUMBAR WITHOUT CONTRAST;  Surgeon: Medication Radiologist, MD;  Location: [facility] OR;  Service: Radiology;  Laterality: N/A;  DR. WOOD/MRI   RIGHT/LEFT HEART CATH AND CORONARY ANGIOGRAPHY N/A 07/26/2018   Procedure: RIGHT/LEFT HEART CATH AND CORONARY ANGIOGRAPHY;  Surgeon: Claudene Victory LELON, MD;  Location: [facility] INVASIVE CV LAB;  Service: Cardiovascular;  Laterality: N/A;   Skin melanocytoma excision  2012   above left clavicle   STERNAL INCISION RECLOSURE  11/2018   STERNAL WIRE REMOVAL  11/2018   STERNAL WOUND DEBRIDEMENT  11/2018   TEE WITHOUT CARDIOVERSION  03/22/2012   Procedure: TRANSESOPHAGEAL ECHOCARDIOGRAM (TEE);  Surgeon: Oneil Parchment, MD;  Location: [facility] ENDOSCOPY;  Service: Cardiovascular;  Laterality: N/A;   TEE WITHOUT CARDIOVERSION N/A 02/08/2016   Procedure: TRANSESOPHAGEAL ECHOCARDIOGRAM (TEE);  Surgeon: Redell GORMAN Shallow, MD;  Location: [facility] ENDOSCOPY;  Service: Cardiovascular;  Laterality: N/A;   Patient Active Problem List   Diagnosis Date Noted   Umbilical hernia without obstruction and without gangrene 05/11/2024   Chalazion of right upper eyelid 05/11/2024   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 05/11/2024   Venous stasis  05/11/2024   Adjustment insomnia 01/01/2024   Presence of heart assist device (HCC) 12/31/2023   Enlarged prostate 09/10/2023   Dyslipidemia due to type 2 diabetes mellitus (HCC) 09/10/2023   Acquired thrombophilia 05/14/2023   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   Goals of care, counseling/discussion 05/05/2020   Multiple myeloma (HCC) 05/05/2020   S/P flap graft 01/07/2019   Coronary artery disease 01/07/2019   Long term (current) use of antibiotics 01/03/2019   Essential hypertension 12/27/2018   Postoperative infection of wound of sternum 12/27/2018   Decreased strength, endurance, and mobility 12/27/2018   Cardiac LV ejection fraction 30-35% 12/27/2018   Decreased activities of daily living (ADL) 12/27/2018   Klebsiella infection 12/27/2018   S/P AVR (aortic valve replacement) 11/23/2018   S/P ascending aortic aneurysm repair 11/23/2018   NICM (nonischemic cardiomyopathy) (HCC) 11/19/2018  AICD (automatic cardioverter/defibrillator) present 11/19/2018   Other symptoms and signs involving the musculoskeletal system 05/08/2018   Long term current use of amiodarone  08/07/2016   Type 2 diabetes mellitus with stage 3b chronic kidney disease, without long-term current use of insulin  (HCC)    Femoral neuropathy    Leg weakness, bilateral    PAF (paroxysmal atrial fibrillation) (HCC)    Thyroid  activity decreased    Arrhythmia    Long term current use of anticoagulant 03/04/2013   Atrial flutter (HCC) 03/18/2012    Class: Acute   Chronic combined systolic and diastolic CHF (congestive heart failure) (HCC) 02/13/2012    Class: Acute   Hypertension, accelerated 02/13/2012   Hyperlipidemia 02/13/2012   Left bundle branch block 02/13/2012   Obesity (BMI 30-39.9) 02/13/2012    ONSET DATE:   REFERRING DIAG:  Diagnosis R53.1,Z74.09,R68.89 (ICD-10-CM) - Decreased strength, endurance, and mobility   THERAPY DIAG:  Unsteadiness on feet  Muscle weakness  (generalized)  Other abnormalities of gait and mobility  Rationale for Evaluation and Treatment: Rehabilitation  SUBJECTIVE:                                                                                                                                                                                             SUBJECTIVE STATEMENT: Patient reports he is feeling tired today. Patient has not done his HEP yet but will complete it later this week  Eval: I have peripheral neuropathy in the Rt leg.  I am also noticing some weakness in both of the arms maybe from taking the ozempic .  I may want to do more aquatic therapy.  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  I have tingling on the inner groin, knee and front of the leg.  Nothing really in the foot.   I am playing 9 holes of golf lately.  I walk at home.  I go back to [place] [employer] to ride the bike.    PERTINENT HISTORY: , atrial fibrillation, cardiac arrest s/p ICD, CAD, severe aortic regurgitation s/p AVR 10/2018 and aortic root replacement with CABGx1, aortic dilation, hypertension, hypothyroidism, type 2 diabetes, multiple myeloma, CKD IIIb-IV, migraine  PAIN:  Are you having pain? Yes: NPRS scale: no I mainly get some tingling in the leg that just feels weird   PRECAUTIONS: Fall and ICD/Pacemaker  RED FLAGS: None   WEIGHT BEARING RESTRICTIONS: No  FALLS: Has patient fallen in last 6 months? No  LIVING ENVIRONMENT: Lives with: alone  Lives in: House/apartment Stairs: 4 steps to enter; 3 story home Has following equipment at home: [employer]  bench, and raised commode  PLOF: Independent;  working part time as Ophthalmologist- requires sitting, standing, walking   PATIENT GOALS: improve R LE strength and bil UE strength   OBJECTIVE:  Note: Objective measures were completed at Evaluation unless otherwise noted.  DIAGNOSTIC FINDINGS: none recent  COGNITION: Overall cognitive status:  Within functional limits for tasks assessed   SENSATION: Intact in B LEs to light touch   POSTURE: rounded shoulders and forward head  LOWER EXTREMITY ROM:     Active  Right Eval Left Eval  Hip flexion    Hip extension    Hip abduction    Hip adduction    Hip internal rotation    Hip external rotation    Knee flexion    Knee extension    Ankle dorsiflexion    Ankle plantarflexion    Ankle inversion    Ankle eversion     (Blank rows = not tested)  LOWER EXTREMITY MMT:    MMT Right Eval Left Eval  Hip flexion 3+ 4  Hip extension    Hip abduction 4+ 4+  Hip adduction 4 4  Hip internal rotation 4+ 4+  Hip external rotation 4- 4  Knee flexion 5 5  Knee extension 4- 4+  Ankle dorsiflexion 5 5  Ankle plantarflexion 4+ 4+  Ankle inversion    Ankle eversion    (Blank rows = not tested)  UPPER EXTREMITY MMT:  MMT Right eval Left eval  Shoulder flexion 4 4  Shoulder extension    Shoulder abduction 4 4  Shoulder adduction    Shoulder extension    Shoulder internal rotation 5 5  Shoulder external rotation 5 5  Middle trapezius    Lower trapezius    Elbow flexion 4+ 5  Elbow extension 4+ 4+  Wrist flexion    Wrist extension    Wrist ulnar deviation    Wrist radial deviation    Wrist pronation    Wrist supination    Grip strength 58 40   (Blank rows = not tested)  GAIT: Gait pattern: Significant R-ward trunk lean with gait; slightly slowed gait speed  Assistive device utilized: None Level of assistance: Modified independence  FUNCTIONAL TESTS:  5x sit to stand:   walk: able to walk 535ft stopping due to the Rt leg feeling tight.  SOB level 4/10  06/25/24 SL stance:  Rt: unable to perform without UE support; with  single arm support able to hold for 20 Lt: 3 seconds without UE support; 20 with single arm UE support   Tandem stance:  Rt: 20 with single arm UE support  Lt: 30 without UE support    TODAY'S TREATMENT:                                                                                                                               06/25/24 NuStep UE 10/ LE 8 level 3 for 6 min  Sit  to Stand from chair x 8 with UE support with the last 3 reps Standing hip 3-way (flex, ext, abd) x 8 each with VC for proper technique & rest breaks between exercises with UE support in // bars  Standing high knees x 10 bilateral with UE support in // bars  Step taps 4 step x 12 bilaterally with UE support in // bars  LAQs 1# ankle weights 2 x 10 bilateral Seated adduction ball squeezes 3 hold x 10 with VC to not hold breath when squeezing ball Tandem balance in // bars 2 x 30 each SLS with one arm support in // bars 2 x 15   06/23/24 Discussed POC including aquatics  PATIENT EDUCATION: Education details: per today's note Person educated: Patient Education method: Programmer, multimedia, Facilities manager, Actor cues, Verbal cues, and Handouts Education comprehension: verbalized understanding and returned demonstration  HOME EXERCISE PROGRAM: Access Code: [email or web address] URL: [email or web address] Date: 06/23/2024 Prepared by: Saddie Raw  Exercises - Standing Hip Abduction with Counter Support  - 1 x daily - 5 x weekly - 2 sets - 10 reps - Side Stepping with Resistance at Ankles and Counter Support  - 1 x daily - 5 x weekly - 1 sets - 3-5 reps - Standing Hip Extension with Counter Support  - 1 x daily - 5 x weekly - 1-3 sets - 10 reps - 20-30 seconds hold - Seated Long Arc Quad with Ankle Weight  - 1 x daily - 5 x weekly - 2 sets - 10 reps - Sit to Stand  - 1 x daily - 5 x weekly - 2 sets - 10 reps - Seated Hamstring Stretch  - 1 x daily - 7 x weekly - 1-3 sets - 3 reps - 20-30 seconds hold  GOALS: Goals reviewed with patient? Yes  SHORT TERM GOALS: Target date: 06/23/24/2024  Patient to be independent with initial HEP. Baseline: HEP initiated Goal status: MET from previous PT sessions    LONG TERM GOALS: Target date:  08/18/24/2024  Patient to be independent with advanced HEP. Baseline: Not yet initiated  Goal status: INITIAL  Patient to demonstrate Rt LE strength >/=4/5.  Baseline: See above Goal status: INITIAL  -    Pt will improve his walk test to being able to walk the full without rest and reaching at least 1041ft  Baseline: 56ft     ASSESSMENT:  CLINICAL IMPRESSION: Patient tolerates treatment well with a mild increase in fatigue noted at the end of the visit. Patient reports standing on the Rt LE when performing standing hip abd, flex, & ext is challenging due to the balance aspect. Rest breaks throughout the session are required due to fatigue. Decreased Rt hip flexion is observed during standing marching but no pain is reported. Patient is able to perform 5 sit to stands from the chair without UE support but uses UE support during the last 3 repetitions due to muscle fatigue. Standing balance in the parallel bars is assessed during the visit. The patient is unable to perform Rt SLS without UE support and can stand on the Lt leg for 3 seconds before UE support is required; however, the patient can hold a SLS for 15 seconds bilaterally when using one arm as support. Patient education is provided for possible muscle soreness following his visit and is educated to complete his HEP every other day if he has significant soreness following. Patient would benefit from continued skilled physical therapy to address global weakness and balance deficits.  OBJECTIVE IMPAIRMENTS: Abnormal gait, decreased balance, difficulty walking, decreased ROM, decreased strength, impaired flexibility, impaired sensation, postural dysfunction, and pain.   ACTIVITY LIMITATIONS: carrying, lifting, bending, standing, squatting, stairs, transfers, bathing, toileting, dressing, locomotion level, and caring for others  PARTICIPATION LIMITATIONS: meal prep, cleaning, laundry, shopping, community activity, occupation,  yard work, and church  PERSONAL FACTORS: Age, Past/current experiences, Time since onset of injury/illness/exacerbation, and 3+ comorbidities: HFrEF, atrial fibrillation, VF cardiac arrest s/p ICD, CAD, severe aortic regurgitation s/p AVR 10/2018 and aortic root replacement with CABGx1, aortic dilation, hypertension, hypothyroidism, type 2 diabetes, multiple myeloma, CKD IIIb-IV, migraine are also affecting patient's functional outcome.   REHAB POTENTIAL: Good  CLINICAL DECISION MAKING: LOW  EVALUATION COMPLEXITY: LOW  PLAN:  PT FREQUENCY: 2x/week  PT DURATION: 8 weeks   PLANNED INTERVENTIONS: 97164- PT Re-evaluation, 97110-Therapeutic exercises, 97530- Therapeutic activity, 97112- Neuromuscular re-education, 97535- Self Care, 02859- Manual therapy, [phone]- Gait training, [phone]- Canalith repositioning, [phone]- Aquatic Therapy, Patient/Family education, Balance training, Stair training, Taping, Dry Needling, Joint mobilization, Spinal mobilization, Vestibular training, DME instructions, Cryotherapy, and Moist heat  PLAN FOR NEXT SESSION: general strength like nustep, Rt LE general but hip focus, and general bil UE (no sig weakness here), SL & tandem balance    Randall Pack, SPT 06/25/24, 10:56 AM   I agree with the following treatment note after reviewing documentation. This session was performed under the supervision of a licensed clinician.  Saddie Raw, PT 06/25/24, 10:57 AM

## 2024-06-27 DIAGNOSIS — N1832 Chronic kidney disease, stage 3b: Secondary | ICD-10-CM | POA: Diagnosis not present

## 2024-06-28 LAB — LAB REPORT - SCANNED: EGFR: 27

## 2024-07-01 DIAGNOSIS — N184 Chronic kidney disease, stage 4 (severe): Secondary | ICD-10-CM | POA: Diagnosis not present

## 2024-07-01 DIAGNOSIS — Z952 Presence of prosthetic heart valve: Secondary | ICD-10-CM | POA: Diagnosis not present

## 2024-07-01 DIAGNOSIS — I7789 Other specified disorders of arteries and arterioles: Secondary | ICD-10-CM | POA: Diagnosis not present

## 2024-07-01 DIAGNOSIS — I088 Other rheumatic multiple valve diseases: Secondary | ICD-10-CM | POA: Diagnosis not present

## 2024-07-01 DIAGNOSIS — I7121 Aneurysm of the ascending aorta, without rupture: Secondary | ICD-10-CM | POA: Diagnosis not present

## 2024-07-01 DIAGNOSIS — Z8679 Personal history of other diseases of the circulatory system: Secondary | ICD-10-CM | POA: Diagnosis not present

## 2024-07-01 DIAGNOSIS — I351 Nonrheumatic aortic (valve) insufficiency: Secondary | ICD-10-CM | POA: Diagnosis not present

## 2024-07-01 DIAGNOSIS — Z953 Presence of xenogenic heart valve: Secondary | ICD-10-CM | POA: Diagnosis not present

## 2024-07-01 DIAGNOSIS — Z9889 Other specified postprocedural states: Secondary | ICD-10-CM | POA: Diagnosis not present

## 2024-07-01 DIAGNOSIS — Z48812 Encounter for surgical aftercare following surgery on the circulatory system: Secondary | ICD-10-CM | POA: Diagnosis not present

## 2024-07-04 DIAGNOSIS — N2581 Secondary hyperparathyroidism of renal origin: Secondary | ICD-10-CM | POA: Diagnosis not present

## 2024-07-04 DIAGNOSIS — N1832 Chronic kidney disease, stage 3b: Secondary | ICD-10-CM | POA: Diagnosis not present

## 2024-07-04 DIAGNOSIS — D631 Anemia in chronic kidney disease: Secondary | ICD-10-CM | POA: Diagnosis not present

## 2024-07-04 DIAGNOSIS — I129 Hypertensive chronic kidney disease with stage 1 through stage 4 chronic kidney disease, or unspecified chronic kidney disease: Secondary | ICD-10-CM | POA: Diagnosis not present

## 2024-07-08 ENCOUNTER — Other Ambulatory Visit: Payer: Self-pay | Admitting: Family Medicine

## 2024-07-08 DIAGNOSIS — N1832 Chronic kidney disease, stage 3b: Secondary | ICD-10-CM

## 2024-07-09 ENCOUNTER — Telehealth: Payer: Self-pay

## 2024-07-09 ENCOUNTER — Ambulatory Visit: Admitting: Rehabilitation

## 2024-07-09 DIAGNOSIS — R2689 Other abnormalities of gait and mobility: Secondary | ICD-10-CM

## 2024-07-09 DIAGNOSIS — R2681 Unsteadiness on feet: Secondary | ICD-10-CM

## 2024-07-09 DIAGNOSIS — M6281 Muscle weakness (generalized): Secondary | ICD-10-CM

## 2024-07-09 NOTE — Telephone Encounter (Addendum)
 Called Dr. Trudy to get clarification on BP restrictions. Clear for exercise. BP should trend <130/80 when the patient takes it at home, if it continues to trend higher than this the patient should contact his PCP or cardiologist.

## 2024-07-09 NOTE — Therapy (Signed)
 Pt arrived and we discussed findings noted in his most recent cardiothoracic surgery visit which stated he is on a BP restriction of 130/80.  BP today was 108/87.  The plan is to call the office to get clarification on exercise and his BP.  No treatment today.   Called Dr. Trudy office and also sent a mychart message to Dr. Raford his cardiologist  Eric [name], PT 07/09/24, 9:28 AM

## 2024-07-11 ENCOUNTER — Encounter

## 2024-07-15 ENCOUNTER — Telehealth: Payer: Self-pay

## 2024-07-15 NOTE — Telephone Encounter (Signed)
 After speaking with Rozelle, RN in Device, he confirmed patient would need an appointment before clearance for colonoscopy. Lvmtcb advising patient of this, my direct number provided in case he has any questions.

## 2024-07-15 NOTE — Telephone Encounter (Signed)
*  late entry* Spoke w/ patient - he is scheduled to see Dr. Waddell on 11/12 for an overdue f/u (LOV 12/2020) and to discuss gen change for ICD d/t hitting ERI 05/2024. At the end of phone call, patient stated he has a colonoscopy planned for beginning of Nov (before his appt) with Dr. Waddell. He wanted to know if this would be a problem. I let the patient know that I believe it would be since he is overdue for that follow up since he is overdue. I let the patient know that I would check with Device RN and get back to him.

## 2024-07-15 NOTE — Telephone Encounter (Signed)
 Returned call to patient regarding sensation of vibrating in chest. Pt has a [employer] Bi-V ICD and informed when it is time for battery to be changed out it will cause vibrating sensation. On June 10, 2024, patient was estimated to have 3.9 months.   Request for patient to send manual transmission. Patient unable to send manual transmission as he just landed in [place], [place] and does not have his monitor with him. States he will be back Sunday and can send transmission then. Ensured patient 3 months of battery is still left on device from time of reaching ERI.   Patient is overdue for F/U appointment w/ EP and also needs to discuss Gen Change procedure w/ provider. Will forward to scheduling team to set up appointment to discuss changeout. Patient aware and agreeable.   Informed if patient has any issues while away from home to not hesitate to call device clinic or be seen in-person for evaluation. Verbalized understanding. Appreciative of call.   Will continue to monitor and update accordingly.

## 2024-07-15 NOTE — Telephone Encounter (Signed)
 Pt called in lvm stating he felt vibrating in his chest twice. Pt states it was not a shock this is just the first time this has happened

## 2024-07-16 ENCOUNTER — Telehealth: Payer: Self-pay | Admitting: Gastroenterology

## 2024-07-16 NOTE — Telephone Encounter (Signed)
 Inbound call from patient stating he is going to need to cancel upcoming procedure on 07/28/24 at [facility] due to needing to see cardiologist to check his pace maker first and he couldn't get an appointment until 08/06/24.  Please advise  Thank you

## 2024-07-17 ENCOUNTER — Telehealth: Payer: Self-pay

## 2024-07-17 NOTE — Telephone Encounter (Signed)
 [facility] Pre-operative Risk Assessment     Request for surgical clearance:     Endoscopy Procedure  What type of surgery is being performed?     Colonoscopy  When is this surgery scheduled?     08/18/24  What type of clearance is required ?   Medical  Are there any medications that need to be held prior to surgery and how long?   Practice name and name of physician performing surgery?      Dr. Charlanne, [facility]  What is your office phone and fax number?      Phone- [phone]  Fax- [phone]  Anesthesia type (None, local, MAC, general) ?       MAC   Please route your response to Nyla Basket, RN

## 2024-07-17 NOTE — Telephone Encounter (Signed)
 Cancelled hospital case d/t patient needing cardio OV. Cardiac clearance note to follow. Sent pt a message via mychart.

## 2024-07-17 NOTE — Telephone Encounter (Signed)
   Name: Eric [name]., MD  DOB: Nov 03, 1950  MRN: [id number]  Primary Cardiologist: Annabella Scarce, MD  Chart reviewed as part of pre-operative protocol coverage. The patient has an upcoming visit scheduled with Dr. Scarce on 08/01/2024 at which time clearance can be addressed in case there are any issues that would impact surgical recommendations.  Colonoscopy is not scheduled until 08/18/2024 as below. I added preop FYI to appointment note so that provider is aware to address at time of outpatient visit.  Per office protocol the cardiology provider should forward their finalized clearance decision and recommendations regarding antiplatelet therapy to the requesting party below.    This message will also be routed to pharmacy pool for input on holding Eliquis  as requested below so that this information is available to the clearing provider at time of patient's appointment.   I will route this message as FYI to requesting party and remove this message from the preop box as separate preop APP input not needed at this time.   Please call with any questions.  Damien JAYSON Braver, NP  07/17/2024, 3:46 PM

## 2024-07-17 NOTE — Telephone Encounter (Signed)
Left message for patient to call back. Also sent mychart message.  

## 2024-07-17 NOTE — Telephone Encounter (Signed)
 Spoke with patient & will tentatively plan for colonoscopy on 11/24 at 11:45 am at [facility] with Dr. Charlanne. Updated prep instructions sent to pt. Cardiac clearance note sent in separate phone note.

## 2024-07-21 ENCOUNTER — Ambulatory Visit

## 2024-07-21 ENCOUNTER — Ambulatory Visit: Attending: Internal Medicine

## 2024-07-22 LAB — CUP PACEART REMOTE DEVICE CHECK
Battery Remaining Longevity: 0 mo
Battery Voltage: 2.59 V
Date Time Interrogation Session: 20251027200222
HighPow Impedance: 41 Ohm
HighPow Impedance: 41 Ohm
Implantable Lead Connection Status: 753985
Implantable Lead Connection Status: 753985
Implantable Lead Connection Status: 753985
Implantable Lead Implant Date: 20170525
Implantable Lead Implant Date: 20170525
Implantable Lead Implant Date: 20170525
Implantable Lead Location: 753858
Implantable Lead Location: 753859
Implantable Lead Location: 753860
Implantable Lead Model: 7122
Implantable Pulse Generator Implant Date: 20170525
Lead Channel Impedance Value: 380 Ohm
Lead Channel Impedance Value: 430 Ohm
Lead Channel Impedance Value: 540 Ohm
Lead Channel Pacing Threshold Amplitude: 0.5 V
Lead Channel Pacing Threshold Amplitude: 1.25 V
Lead Channel Pacing Threshold Pulse Width: 0.5 ms
Lead Channel Pacing Threshold Pulse Width: 0.5 ms
Lead Channel Sensing Intrinsic Amplitude: 0.2 mV
Lead Channel Sensing Intrinsic Amplitude: 11.4 mV
Lead Channel Setting Pacing Amplitude: 2 V
Lead Channel Setting Pacing Amplitude: 2.5 V
Lead Channel Setting Pacing Pulse Width: 0.5 ms
Lead Channel Setting Pacing Pulse Width: 0.5 ms
Lead Channel Setting Sensing Sensitivity: 0.5 mV
Pulse Gen Serial Number: 7357926
Zone Setting Status: 755011

## 2024-07-23 ENCOUNTER — Other Ambulatory Visit (HOSPITAL_BASED_OUTPATIENT_CLINIC_OR_DEPARTMENT_OTHER): Payer: Self-pay | Admitting: Family

## 2024-07-23 ENCOUNTER — Telehealth: Payer: Self-pay

## 2024-07-23 DIAGNOSIS — I5022 Chronic systolic (congestive) heart failure: Secondary | ICD-10-CM

## 2024-07-23 NOTE — Telephone Encounter (Signed)
 Patient with diagnosis of Afib on Eliquis  for anticoagulation.    Procedure: Colonoscopy  Date of procedure: 08/18/24   CHA2DS2-VASc Score = 5   This indicates a 7.2% annual risk of stroke. The patient's score is based upon: CHF History: 1 HTN History: 1 Diabetes History: 1 Stroke History: 0 Vascular Disease History: 1 Age Score: 1 Gender Score: 0    CrCl 32 mL/min  Platelet count 162 K  Patient has not  had an Afib/aflutter ablation in the last 3 months, DCCV within the last 4 weeks or a watchman implanted in the last 45 days     Per office protocol, patient can hold Eliquis  for 2 days prior to procedure.   Patient will not need bridging with Lovenox  (enoxaparin ) around procedure.  **This guidance is not considered finalized until pre-operative APP has relayed final recommendations.**

## 2024-07-23 NOTE — Telephone Encounter (Signed)
 Procedure:COLON Procedure date: 07/28/24 Procedure location: [facility] Arrival Time: 8:15 Spoke with the patient Y/N: N Any prep concerns? N  Has the patient obtained the prep from the pharmacy ? N Do you have a care partner and transportation: N Any additional concerns? N  I called this patient on 3 different occasions and the call went to voice mail.  I left a detailed message and the office number in case patient has questions are concerns.

## 2024-07-24 ENCOUNTER — Ambulatory Visit: Payer: Self-pay | Admitting: Internal Medicine

## 2024-07-24 ENCOUNTER — Telehealth: Payer: Self-pay

## 2024-07-24 NOTE — Telephone Encounter (Signed)
 LM for pt to call back to schedule ICD Gen Change with Dr. Waddell.

## 2024-07-24 NOTE — Telephone Encounter (Signed)
 Pt called in wanting to know when do you think he will be scheduled for a gen change because he needs to know in advance so that he can make sure someone is there with him. I let pt know he will find out the day of appt but pt wants to know what month. Pt would like a call back today

## 2024-07-25 ENCOUNTER — Ambulatory Visit: Admitting: Physical Therapy

## 2024-07-25 ENCOUNTER — Encounter: Payer: Self-pay | Admitting: Physical Therapy

## 2024-07-25 DIAGNOSIS — M6281 Muscle weakness (generalized): Secondary | ICD-10-CM

## 2024-07-25 DIAGNOSIS — R2689 Other abnormalities of gait and mobility: Secondary | ICD-10-CM | POA: Diagnosis not present

## 2024-07-25 DIAGNOSIS — R2681 Unsteadiness on feet: Secondary | ICD-10-CM

## 2024-07-25 NOTE — Therapy (Signed)
 OUTPATIENT PHYSICAL THERAPY TREATMENT   Patient Name: Carson Bogden., MD MRN: [id number] DOB:10-04-1950, 73 y.o., male Today's Date: 07/25/2024   PCP:  Catheryn Slocumb, MD  REFERRING PROVIDER: Catheryn Slocumb, MD   END OF SESSION:  PT End of Session - 07/25/24 1311     Visit Number 3    Number of Visits 17    Date for Recertification  08/18/24    Authorization Type none    PT Start Time 1310   10 min late   PT Stop Time 1345    PT Time Calculation (min) 35 min    Activity Tolerance Patient tolerated treatment well    Behavior During Therapy [facility] for tasks assessed/performed             Past Medical History:  Diagnosis Date   Acute blood loss anemia    Acute encephalopathy    Acute hypoxemic respiratory failure (HCC)    Acute idiopathic gout of right ankle    Acute lumbar back pain    Acute on chronic systolic heart failure (HCC)    Acute respiratory failure (HCC)    AKI (acute kidney injury)    Altered mental status    Aortic aneurysm without rupture 02/09/2016   Aortic root enlargement 02/13/2012   Aortic valve regurgitation, acquired 02/13/2012   Arrhythmia    Atrial flutter (HCC) 03/18/2012   Back pain    Cardiac arrest (HCC) 02/01/2016   CHF (congestive heart failure) (HCC)    Chronic anticoagulation 03/04/2013   Chronic combined systolic and diastolic heart failure (HCC)    Chronic kidney disease    kidney fx studies increased    Chronic lower back pain    Chronic renal insufficiency    Chronic systolic heart failure (HCC) 02/13/2012   Recent diagnosis 4 / 2013, LVEF 25% by Echo 12/2011  03/2013: Echo at [facility] Cardiology Conclusions: 1. Left ventricular ejection fraction estimated by 2D at 40-45 percent. 2. Mild concentric left ventricular hypertrophy. 3. Mild left atrial enlargement. 4. Moderate aortic valve regurgitation. 5. The aortic root at the sinus(es) of valsalva is moderately dilated 6. Mild mitral valve regurgitation. 7.   CKD (chronic kidney  disease)    CKD (chronic kidney disease), stage IV (HCC) 08/06/2013   Creatinine 2.4 on 07/04/13    Claustrophobia    Colon cancer screening 03/04/2013   Debility 02/22/2016   Diabetes mellitus type 2 in nonobese [facility])    Diabetes mellitus type 2 in obese    Dysrhythmia    palpitations   Encounter for central line placement    Exertional dyspnea 01/2012   Femoral nerve injury 02/22/2016   Femoral neuropathy    Goals of care, counseling/discussion 05/05/2020   HCAP (healthcare-associated pneumonia)    Heart murmur    Hyperlipidemia 02/13/2012   Hypertension    Hypothyroidism    Internal hemorrhoids without mention of complication 04/11/2013   Labile blood pressure    Left bundle branch block 02/13/2012   Leg weakness, bilateral    Long term (current) use of anticoagulants 08/06/2013   Eliquis  therapy    Long term current use of amiodarone  08/07/2016   Lower extremity weakness    Migraine 02/13/2012   opthalmic   Multiple myeloma (HCC) 05/05/2020   Non-traumatic rhabdomyolysis    Obesity (BMI 30-39.9) 02/13/2012   Pain    Paroxysmal atrial fibrillation (HCC)    Pneumonia    Retroperitoneal bleed    Right ankle pain    Right knee pain  Severe aortic regurgitation 02/09/2016   Special screening for malignant neoplasms, colon 04/11/2013   Thyroid  activity decreased    Tibial pain    Varicose vein of leg    right   Ventricular fibrillation (HCC) 02/09/2016   Weakness of both lower extremities    Past Surgical History:  Procedure Laterality Date   AORTIC VALVE REPLACEMENT  11/20/2018   CARDIAC CATHETERIZATION N/A 02/17/2016   Procedure: Left Heart Cath and Coronary Angiography;  Surgeon: Candyce GORMAN Reek, MD;  Location: [facility] INVASIVE CV LAB;  Service: Cardiovascular;  Laterality: N/A;   CARDIOVERSION  03/22/2012   Procedure: CARDIOVERSION;  Surgeon: Oneil Parchment, MD;  Location: [facility] ENDOSCOPY;  Service: Cardiovascular;  Laterality: N/A;   CARDIOVERSION  04/19/2012    Procedure: CARDIOVERSION;  Surgeon: Victory LELON Claudene DOUGLAS, MD;  Location: [facility] OR;  Service: Cardiovascular;  Laterality: N/A;   COLONOSCOPY N/A 04/11/2013   Procedure: COLONOSCOPY;  Surgeon: Lamar JONETTA Aho, MD;  Location: [facility] ENDOSCOPY;  Service: Endoscopy;  Laterality: N/A;   COLONOSCOPY N/A 04/11/2013   Procedure: COLONOSCOPY;  Surgeon: Lamar JONETTA Aho, MD;  Location: [facility] ENDOSCOPY;  Service: Endoscopy;  Laterality: N/A;   EP IMPLANTABLE DEVICE N/A 02/17/2016   Procedure: BiV ICD Insertion CRT-D;  Surgeon: Danelle LELON Birmingham, MD;  Location: [facility] INVASIVE CV LAB;  Service: Cardiovascular;  Laterality: N/A;   FINGER SURGERY  2012   4th digit right hand; thumb on left hand   RADIOLOGY WITH ANESTHESIA N/A 02/11/2016   Procedure: MRI OF THE BRAIN WITHOUT CONTRAST, LUMBAR WITHOUT CONTRAST;  Surgeon: Medication Radiologist, MD;  Location: [facility] OR;  Service: Radiology;  Laterality: N/A;  DR. WOOD/MRI   RIGHT/LEFT HEART CATH AND CORONARY ANGIOGRAPHY N/A 07/26/2018   Procedure: RIGHT/LEFT HEART CATH AND CORONARY ANGIOGRAPHY;  Surgeon: Claudene Victory LELON, MD;  Location: [facility] INVASIVE CV LAB;  Service: Cardiovascular;  Laterality: N/A;   Skin melanocytoma excision  2012   above left clavicle   STERNAL INCISION RECLOSURE  11/2018   STERNAL WIRE REMOVAL  11/2018   STERNAL WOUND DEBRIDEMENT  11/2018   TEE WITHOUT CARDIOVERSION  03/22/2012   Procedure: TRANSESOPHAGEAL ECHOCARDIOGRAM (TEE);  Surgeon: Oneil Parchment, MD;  Location: [facility] ENDOSCOPY;  Service: Cardiovascular;  Laterality: N/A;   TEE WITHOUT CARDIOVERSION N/A 02/08/2016   Procedure: TRANSESOPHAGEAL ECHOCARDIOGRAM (TEE);  Surgeon: Redell GORMAN Shallow, MD;  Location: [facility] ENDOSCOPY;  Service: Cardiovascular;  Laterality: N/A;   Patient Active Problem List   Diagnosis Date Noted   Umbilical hernia without obstruction and without gangrene 05/11/2024   Chalazion of right upper eyelid 05/11/2024   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 05/11/2024    Venous stasis 05/11/2024   Adjustment insomnia 01/01/2024   Presence of heart assist device (HCC) 12/31/2023   Enlarged prostate 09/10/2023   Dyslipidemia due to type 2 diabetes mellitus (HCC) 09/10/2023   Acquired thrombophilia 05/14/2023   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   Goals of care, counseling/discussion 05/05/2020   Multiple myeloma (HCC) 05/05/2020   S/P flap graft 01/07/2019   Coronary artery disease 01/07/2019   Long term (current) use of antibiotics 01/03/2019   Essential hypertension 12/27/2018   Postoperative infection of wound of sternum 12/27/2018   Decreased strength, endurance, and mobility 12/27/2018   Cardiac LV ejection fraction 30-35% 12/27/2018   Decreased activities of daily living (ADL) 12/27/2018   Klebsiella infection 12/27/2018   S/P AVR (aortic valve replacement) 11/23/2018   S/P ascending aortic aneurysm repair 11/23/2018   NICM (nonischemic cardiomyopathy) (  HCC) 11/19/2018   AICD (automatic cardioverter/defibrillator) present 11/19/2018   Other symptoms and signs involving the musculoskeletal system 05/08/2018   Long term current use of amiodarone  08/07/2016   Type 2 diabetes mellitus with stage 3b chronic kidney disease, without long-term current use of insulin  (HCC)    Femoral neuropathy    Leg weakness, bilateral    PAF (paroxysmal atrial fibrillation) (HCC)    Thyroid  activity decreased    Arrhythmia    Long term current use of anticoagulant 03/04/2013   Atrial flutter (HCC) 03/18/2012    Class: Acute   Chronic combined systolic and diastolic CHF (congestive heart failure) (HCC) 02/13/2012    Class: Acute   Hypertension, accelerated 02/13/2012   Hyperlipidemia 02/13/2012   Left bundle branch block 02/13/2012   Obesity (BMI 30-39.9) 02/13/2012    ONSET DATE:   REFERRING DIAG:  Diagnosis R53.1,Z74.09,R68.89 (ICD-10-CM) - Decreased strength, endurance, and mobility   THERAPY DIAG:  Unsteadiness on feet  Muscle weakness  (generalized)  Other abnormalities of gait and mobility  Rationale for Evaluation and Treatment: Rehabilitation  SUBJECTIVE:                                                                                                                                                                                             SUBJECTIVE STATEMENT: Just a little tired from a bust day yesterday.  Eval: I have peripheral neuropathy in the Rt leg.  I am also noticing some weakness in both of the arms maybe from taking the ozempic .  I may want to do more aquatic therapy.  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  I have tingling on the inner groin, knee and front of the leg.  Nothing really in the foot.   I am playing 9 holes of golf lately.  I walk at home.  I go back to the [employer] to ride the bike.    PERTINENT HISTORY: , atrial fibrillation, cardiac arrest s/p ICD, CAD, severe aortic regurgitation s/p AVR 10/2018 and aortic root replacement with CABGx1, aortic dilation, hypertension, hypothyroidism, type 2 diabetes, multiple myeloma, CKD IIIb-IV, migraine  PAIN:  Are you having pain? Yes: NPRS scale: no I mainly get some tingling in the leg that just feels weird   PRECAUTIONS: Fall and ICD/Pacemaker  RED FLAGS: None   WEIGHT BEARING RESTRICTIONS: No  FALLS: Has patient fallen in last 6 months? No  LIVING ENVIRONMENT: Lives with: alone  Lives in: House/apartment Stairs: 4 steps to enter; 3 story home Has following equipment at home: Shower bench, and raised commode  PLOF: Independent;  working part time as Clinical Research Associate- requires sitting, standing, walking   PATIENT GOALS: improve R LE strength and bil UE strength   OBJECTIVE:  Note: Objective measures were completed at Evaluation unless otherwise noted.  DIAGNOSTIC FINDINGS: none recent  COGNITION: Overall cognitive status: Within functional limits for tasks assessed   SENSATION: Intact in B  LEs to light touch   POSTURE: rounded shoulders and forward head  LOWER EXTREMITY ROM:     Active  Right Eval Left Eval  Hip flexion    Hip extension    Hip abduction    Hip adduction    Hip internal rotation    Hip external rotation    Knee flexion    Knee extension    Ankle dorsiflexion    Ankle plantarflexion    Ankle inversion    Ankle eversion     (Blank rows = not tested)  LOWER EXTREMITY MMT:    MMT Right Eval Left Eval  Hip flexion 3+ 4  Hip extension    Hip abduction 4+ 4+  Hip adduction 4 4  Hip internal rotation 4+ 4+  Hip external rotation 4- 4  Knee flexion 5 5  Knee extension 4- 4+  Ankle dorsiflexion 5 5  Ankle plantarflexion 4+ 4+  Ankle inversion    Ankle eversion    (Blank rows = not tested)  UPPER EXTREMITY MMT:  MMT Right eval Left eval  Shoulder flexion 4 4  Shoulder extension    Shoulder abduction 4 4  Shoulder adduction    Shoulder extension    Shoulder internal rotation 5 5  Shoulder external rotation 5 5  Middle trapezius    Lower trapezius    Elbow flexion 4+ 5  Elbow extension 4+ 4+  Wrist flexion    Wrist extension    Wrist ulnar deviation    Wrist radial deviation    Wrist pronation    Wrist supination    Grip strength 58 40   (Blank rows = not tested)  GAIT: Gait pattern: Significant R-ward trunk lean with gait; slightly slowed gait speed  Assistive device utilized: None Level of assistance: Modified independence  FUNCTIONAL TESTS:  5x sit to stand:   walk: able to walk 540ft stopping due to the Rt leg feeling tight.  SOB level 4/10  06/25/24 SL stance:  Rt: unable to perform without UE support; with  single arm support able to hold for 20 Lt: 3 seconds without UE support; 20 with single arm UE support   Tandem stance:  Rt: 20 with single arm UE support  Lt: 30 without UE support    TODAY'S TREATMENT:     07/25/24:Pt arrives for aquatic physical therapy. Treatment took place in 3.5-5.5 feet  of water. Water temperature was 91 degrees F. Pt entered the pool via stairs independently. Seated water bench with 75% submersion Pt performed seated LE AROM exercises 20x in all planes, review of previous aquatic exercises, it has been > 6 mos so pt could not remember much -75% water depth for walking 3 ways, natural arms 6 lengths of each -Hip 3 ways 10x Bil holding onto the wall. Vc for control -high knee marching holding yellow noodle 4 lengths -Horseback bicycle on yellow noodle in the corner 2:30  06/25/24 NuStep UE 10/ LE 8 level 3 for 6 min  Sit to Stand from chair x 8 with UE support with the last 3 reps Standing hip 3-way (flex, ext, abd) x 8 each with VC for proper technique & rest breaks between exercises with UE support in // bars  Standing high knees x 10 bilateral with UE support in // bars  Step taps 4 step x 12 bilaterally with UE support in // bars  LAQs 1# ankle weights 2 x 10 bilateral Seated adduction ball squeezes 3 hold x 10 with VC to not hold breath when squeezing ball Tandem balance in // bars 2 x 30 each SLS with one arm support in // bars 2 x 15   06/23/24 Discussed POC including aquatics  PATIENT EDUCATION: Education details: per today's note Person educated: Patient Education method: Programmer, Multimedia, Facilities Manager, Actor cues, Verbal cues, and Handouts Education comprehension: verbalized understanding and returned demonstration  HOME EXERCISE PROGRAM: Access Code: [email or web address] URL: [email or web address] Date: 06/23/2024 Prepared by: Saddie Raw  Exercises - Standing Hip Abduction with Counter Support  - 1 x daily - 5 x weekly - 2 sets - 10 reps - Side Stepping with Resistance at Ankles and Counter Support  - 1 x daily - 5 x weekly - 1 sets - 3-5 reps - Standing Hip Extension with Counter Support  - 1 x daily - 5 x  weekly - 1-3 sets - 10 reps - 20-30 seconds hold - Seated Long Arc Quad with Ankle Weight  - 1 x daily - 5 x weekly - 2 sets - 10 reps - Sit to Stand  - 1 x daily - 5 x weekly - 2 sets - 10 reps - Seated Hamstring Stretch  - 1 x daily - 7 x weekly - 1-3 sets - 3 reps - 20-30 seconds hold  GOALS: Goals reviewed with patient? Yes  SHORT TERM GOALS: Target date: 06/23/24/2024  Patient to be independent with initial HEP. Baseline: HEP initiated Goal status: MET from previous PT sessions    LONG TERM GOALS: Target date: 08/18/24/2024  Patient to be independent with advanced HEP. Baseline: Not yet initiated  Goal status: INITIAL  Patient to demonstrate Rt LE strength >/=4/5.  Baseline: See above Goal status: INITIAL  -    Pt will improve his walk test to being able to walk the full without rest and reaching at least 1041ft  Baseline: 52ft     ASSESSMENT:  CLINICAL IMPRESSION:Pt presents for first aquatic treatment session. Pt reports having aquatic Pt maybe 6 mos ago. We discussed best practice would be to start from scratch and build a strong foundation while we simulataineously build his endurance.. he tolerated all exercises and activities very well, only mild fatigue mainly on the bicycle. .   OBJECTIVE IMPAIRMENTS: Abnormal gait, decreased balance, difficulty walking, decreased ROM, decreased strength, impaired flexibility, impaired sensation, postural dysfunction, and pain.   ACTIVITY LIMITATIONS: carrying, lifting, bending, standing, squatting, stairs, transfers, bathing, toileting, dressing, locomotion level, and caring for others  PARTICIPATION LIMITATIONS: meal prep, cleaning, laundry, shopping, community activity, occupation, yard work, and church  PERSONAL FACTORS: Age, Past/current experiences, Time since onset of injury/illness/exacerbation, and 3+ comorbidities: HFrEF, atrial fibrillation, VF cardiac arrest s/p ICD, CAD, severe aortic regurgitation s/p AVR  10/2018 and aortic root replacement with CABGx1, aortic dilation, hypertension, hypothyroidism, type 2 diabetes, multiple myeloma, CKD IIIb-IV, migraine are also affecting patient's functional outcome.   REHAB POTENTIAL: Good  CLINICAL DECISION MAKING: LOW  EVALUATION COMPLEXITY: LOW  PLAN:  PT FREQUENCY: 2x/week  PT DURATION: 8 weeks   PLANNED INTERVENTIONS: [id number]- PT Re-evaluation, 97110-Therapeutic exercises, 97530- Therapeutic activity, 97112- Neuromuscular re-education, 97535- Self Care, [id number]- Manual therapy, [phone]- Gait training, [phone]- Canalith repositioning, [phone]- Aquatic Therapy, Patient/Family education, Balance training, Stair training, Taping, Dry Needling, Joint mobilization, Spinal mobilization, Vestibular training, DME instructions, Cryotherapy, and Moist heat  PLAN FOR NEXT SESSION: general strength like nustep, Rt LE general but hip focus, and general bil UE (no sig weakness here), SL & tandem balance      Delon Darner, PTA 07/25/24 1:41 PM

## 2024-07-28 ENCOUNTER — Ambulatory Visit: Admitting: Podiatry

## 2024-07-28 ENCOUNTER — Encounter (HOSPITAL_COMMUNITY): Payer: Self-pay

## 2024-07-28 ENCOUNTER — Ambulatory Visit (HOSPITAL_COMMUNITY): Admit: 2024-07-28 | Admitting: Gastroenterology

## 2024-07-28 SURGERY — COLONOSCOPY
Anesthesia: Monitor Anesthesia Care

## 2024-07-30 ENCOUNTER — Ambulatory Visit: Attending: Internal Medicine

## 2024-07-30 ENCOUNTER — Encounter (HOSPITAL_BASED_OUTPATIENT_CLINIC_OR_DEPARTMENT_OTHER): Payer: Self-pay

## 2024-07-30 DIAGNOSIS — M6281 Muscle weakness (generalized): Secondary | ICD-10-CM | POA: Diagnosis not present

## 2024-07-30 DIAGNOSIS — R2681 Unsteadiness on feet: Secondary | ICD-10-CM | POA: Insufficient documentation

## 2024-07-30 DIAGNOSIS — R2689 Other abnormalities of gait and mobility: Secondary | ICD-10-CM | POA: Diagnosis not present

## 2024-07-30 NOTE — Therapy (Signed)
 OUTPATIENT PHYSICAL THERAPY TREATMENT   Patient Name: Eric Caliendo., MD MRN: [id number] DOB:02-Mar-1951, 73 y.o., male Today's Date: 07/30/2024   PCP:  Catheryn Slocumb, MD  REFERRING PROVIDER: Catheryn Slocumb, MD   END OF SESSION:  PT End of Session - 07/30/24 1015     Visit Number 4    Date for Recertification  08/18/24    PT Start Time 0932    PT Stop Time 1015    PT Time Calculation (min) 43 min    Activity Tolerance Patient tolerated treatment well    Behavior During Therapy [facility] for tasks assessed/performed              Past Medical History:  Diagnosis Date   Acute blood loss anemia    Acute encephalopathy    Acute hypoxemic respiratory failure (HCC)    Acute idiopathic gout of right ankle    Acute lumbar back pain    Acute on chronic systolic heart failure (HCC)    Acute respiratory failure (HCC)    AKI (acute kidney injury)    Altered mental status    Aortic aneurysm without rupture 02/09/2016   Aortic root enlargement 02/13/2012   Aortic valve regurgitation, acquired 02/13/2012   Arrhythmia    Atrial flutter (HCC) 03/18/2012   Back pain    Cardiac arrest (HCC) 02/01/2016   CHF (congestive heart failure) (HCC)    Chronic anticoagulation 03/04/2013   Chronic combined systolic and diastolic heart failure (HCC)    Chronic kidney disease    kidney fx studies increased    Chronic lower back pain    Chronic renal insufficiency    Chronic systolic heart failure (HCC) 02/13/2012   Recent diagnosis 4 / 2013, LVEF 25% by Echo 12/2011  03/2013: Echo at [facility] Cardiology Conclusions: 1. Left ventricular ejection fraction estimated by 2D at 40-45 percent. 2. Mild concentric left ventricular hypertrophy. 3. Mild left atrial enlargement. 4. Moderate aortic valve regurgitation. 5. The aortic root at the sinus(es) of valsalva is moderately dilated 6. Mild mitral valve regurgitation. 7.   CKD (chronic kidney disease)    CKD (chronic kidney disease), stage IV (HCC)  08/06/2013   Creatinine 2.4 on 07/04/13    Claustrophobia    Colon cancer screening 03/04/2013   Debility 02/22/2016   Diabetes mellitus type 2 in nonobese [facility])    Diabetes mellitus type 2 in obese    Dysrhythmia    palpitations   Encounter for central line placement    Exertional dyspnea 01/2012   Femoral nerve injury 02/22/2016   Femoral neuropathy    Goals of care, counseling/discussion 05/05/2020   HCAP (healthcare-associated pneumonia)    Heart murmur    Hyperlipidemia 02/13/2012   Hypertension    Hypothyroidism    Internal hemorrhoids without mention of complication 04/11/2013   Labile blood pressure    Left bundle branch block 02/13/2012   Leg weakness, bilateral    Long term (current) use of anticoagulants 08/06/2013   Eliquis  therapy    Long term current use of amiodarone  08/07/2016   Lower extremity weakness    Migraine 02/13/2012   opthalmic   Multiple myeloma (HCC) 05/05/2020   Non-traumatic rhabdomyolysis    Obesity (BMI 30-39.9) 02/13/2012   Pain    Paroxysmal atrial fibrillation (HCC)    Pneumonia    Retroperitoneal bleed    Right ankle pain    Right knee pain    Severe aortic regurgitation 02/09/2016   Special screening for malignant neoplasms, colon 04/11/2013  Thyroid  activity decreased    Tibial pain    Varicose vein of leg    right   Ventricular fibrillation (HCC) 02/09/2016   Weakness of both lower extremities    Past Surgical History:  Procedure Laterality Date   AORTIC VALVE REPLACEMENT  11/20/2018   CARDIAC CATHETERIZATION N/A 02/17/2016   Procedure: Left Heart Cath and Coronary Angiography;  Surgeon: Candyce GORMAN Reek, MD;  Location: [facility] INVASIVE CV LAB;  Service: Cardiovascular;  Laterality: N/A;   CARDIOVERSION  03/22/2012   Procedure: CARDIOVERSION;  Surgeon: Oneil Parchment, MD;  Location: [facility] ENDOSCOPY;  Service: Cardiovascular;  Laterality: N/A;   CARDIOVERSION  04/19/2012   Procedure: CARDIOVERSION;  Surgeon: Victory LELON Claudene DOUGLAS, MD;   Location: [facility] OR;  Service: Cardiovascular;  Laterality: N/A;   COLONOSCOPY N/A 04/11/2013   Procedure: COLONOSCOPY;  Surgeon: Lamar JONETTA Aho, MD;  Location: [facility] ENDOSCOPY;  Service: Endoscopy;  Laterality: N/A;   COLONOSCOPY N/A 04/11/2013   Procedure: COLONOSCOPY;  Surgeon: Lamar JONETTA Aho, MD;  Location: [facility] ENDOSCOPY;  Service: Endoscopy;  Laterality: N/A;   EP IMPLANTABLE DEVICE N/A 02/17/2016   Procedure: BiV ICD Insertion CRT-D;  Surgeon: Danelle LELON Birmingham, MD;  Location: [facility] INVASIVE CV LAB;  Service: Cardiovascular;  Laterality: N/A;   FINGER SURGERY  2012   4th digit right hand; thumb on left hand   RADIOLOGY WITH ANESTHESIA N/A 02/11/2016   Procedure: MRI OF THE BRAIN WITHOUT CONTRAST, LUMBAR WITHOUT CONTRAST;  Surgeon: Medication Radiologist, MD;  Location: [facility] OR;  Service: Radiology;  Laterality: N/A;  DR. WOOD/MRI   RIGHT/LEFT HEART CATH AND CORONARY ANGIOGRAPHY N/A 07/26/2018   Procedure: RIGHT/LEFT HEART CATH AND CORONARY ANGIOGRAPHY;  Surgeon: Claudene Victory LELON, MD;  Location: [facility] INVASIVE CV LAB;  Service: Cardiovascular;  Laterality: N/A;   Skin melanocytoma excision  2012   above left clavicle   STERNAL INCISION RECLOSURE  11/2018   STERNAL WIRE REMOVAL  11/2018   STERNAL WOUND DEBRIDEMENT  11/2018   TEE WITHOUT CARDIOVERSION  03/22/2012   Procedure: TRANSESOPHAGEAL ECHOCARDIOGRAM (TEE);  Surgeon: Oneil Parchment, MD;  Location: [facility] ENDOSCOPY;  Service: Cardiovascular;  Laterality: N/A;   TEE WITHOUT CARDIOVERSION N/A 02/08/2016   Procedure: TRANSESOPHAGEAL ECHOCARDIOGRAM (TEE);  Surgeon: Redell GORMAN Shallow, MD;  Location: 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   Goals of care, counseling/discussion 05/05/2020   Multiple myeloma (HCC) 05/05/2020   S/P flap graft 01/07/2019   Coronary artery disease 01/07/2019   Long term (current) use of antibiotics 01/03/2019   Essential hypertension 12/27/2018   Postoperative infection of wound of sternum 12/27/2018   Decreased strength, endurance, and mobility 12/27/2018   Cardiac LV ejection fraction 30-35% 12/27/2018   Decreased activities of daily living (ADL) 12/27/2018   Klebsiella infection 12/27/2018   S/P AVR (aortic valve replacement) 11/23/2018   S/P ascending aortic aneurysm repair 11/23/2018   NICM (nonischemic cardiomyopathy) (HCC) 11/19/2018   AICD (automatic cardioverter/defibrillator) present 11/19/2018   Other symptoms and signs  involving the musculoskeletal system 05/08/2018   Long term current use of amiodarone  08/07/2016   Type 2 diabetes mellitus with stage 3b chronic kidney disease, without long-term current use of insulin  (HCC)    Femoral neuropathy    Leg weakness, bilateral    PAF (paroxysmal atrial fibrillation) (HCC)    Thyroid  activity decreased    Arrhythmia    Long term current use of anticoagulant 03/04/2013   Atrial flutter (HCC) 03/18/2012    Class: Acute   Chronic combined systolic and diastolic CHF (congestive heart failure) (HCC) 02/13/2012    Class: Acute   Hypertension, accelerated 02/13/2012   Hyperlipidemia 02/13/2012   Left bundle branch block 02/13/2012   Obesity (BMI 30-39.9) 02/13/2012    ONSET DATE:   REFERRING DIAG:  Diagnosis R53.1,Z74.09,R68.89 (ICD-10-CM) - Decreased strength, endurance, and mobility   THERAPY DIAG:  Unsteadiness on feet  Muscle weakness (generalized)  Other abnormalities of gait and  mobility  Rationale for Evaluation and Treatment: Rehabilitation  SUBJECTIVE:                                                                                                                                                                                             SUBJECTIVE STATEMENT: I really liked the pool.  I have been traveling so I haven't done my exercises much.    Eval: I have peripheral neuropathy in the Rt leg.  I am also noticing some weakness in both of the arms maybe from taking the ozempic .  I may want to do more aquatic therapy.  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  I have tingling on the inner groin, knee and front of the leg.  Nothing really in the foot.   I am playing 9 holes of golf lately.  I walk at home.  I go back to the [employer] to ride the bike.    PERTINENT HISTORY: , atrial fibrillation, cardiac arrest s/p ICD, CAD, severe aortic regurgitation s/p AVR 10/2018 and aortic root replacement with CABGx1, aortic dilation, hypertension, hypothyroidism, type 2 diabetes, multiple myeloma, CKD IIIb-IV, migraine  PAIN:  Are you having pain? Yes: NPRS scale: no I mainly get some tingling in the leg that just feels weird   PRECAUTIONS: Fall and ICD/Pacemaker  RED FLAGS: None   WEIGHT BEARING RESTRICTIONS: No  FALLS: Has patient fallen in last 6 months? No  LIVING ENVIRONMENT: Lives with: alone  Lives in: House/apartment Stairs: 4 steps to enter; 3 story home Has following equipment at home: Shower bench, and raised commode  PLOF: Independent;  working part time as Clinical Research Associate-  requires sitting, standing, walking   PATIENT GOALS: improve R LE strength and bil UE strength   OBJECTIVE:  Note: Objective measures were completed at Evaluation unless otherwise noted.  DIAGNOSTIC FINDINGS: none recent  COGNITION: Overall cognitive status: Within functional limits for tasks assessed   SENSATION: Intact in B LEs to  light touch   POSTURE: rounded shoulders and forward head  LOWER EXTREMITY ROM:     Active  Right Eval Left Eval  Hip flexion    Hip extension    Hip abduction    Hip adduction    Hip internal rotation    Hip external rotation    Knee flexion    Knee extension    Ankle dorsiflexion    Ankle plantarflexion    Ankle inversion    Ankle eversion     (Blank rows = not tested)  LOWER EXTREMITY MMT:    MMT Right Eval Left Eval  Hip flexion 3+ 4  Hip extension    Hip abduction 4+ 4+  Hip adduction 4 4  Hip internal rotation 4+ 4+  Hip external rotation 4- 4  Knee flexion 5 5  Knee extension 4- 4+  Ankle dorsiflexion 5 5  Ankle plantarflexion 4+ 4+  Ankle inversion    Ankle eversion    (Blank rows = not tested)  UPPER EXTREMITY MMT:  MMT Right eval Left eval  Shoulder flexion 4 4  Shoulder extension    Shoulder abduction 4 4  Shoulder adduction    Shoulder extension    Shoulder internal rotation 5 5  Shoulder external rotation 5 5  Middle trapezius    Lower trapezius    Elbow flexion 4+ 5  Elbow extension 4+ 4+  Wrist flexion    Wrist extension    Wrist ulnar deviation    Wrist radial deviation    Wrist pronation    Wrist supination    Grip strength 58 40   (Blank rows = not tested)  GAIT: Gait pattern: Significant R-ward trunk lean with gait; slightly slowed gait speed  Assistive device utilized: None Level of assistance: Modified independence  FUNCTIONAL TESTS:  5x sit to stand:   walk: able to walk 548ft stopping due to the Rt leg feeling tight.  SOB level 4/10  06/25/24 SL stance:  Rt: unable to perform without UE support; with  single arm support able to hold for 20 Lt: 3 seconds without UE support; 20 with single arm UE support   Tandem stance:  Rt: 20 with single arm UE support  Lt: 30 without UE support    TODAY'S TREATMENT:     07/30/24 NuStep UE 12/ LE 8 level 3 for 6 min-PT present to discuss progress- stopped arms at 4  min due to arm pain Sit to Stand from chair x 8 with UE support with the last 3 reps Standing hip 3-way (flex, ext, abd) 2x10 each with VC for proper technique & rest breaks between exercises with UE support in // bars - added 2#  Sidestepping in // bars x 2 laps  Step taps 6 step x 10 bilaterally with UE support in // bars  LAQs 2# ankle weights 2 x 10 bilateral Seated adduction ball squeezes 3 hold 2x10 with VC to not hold breath when squeezing ball Tandem balance in // bars 2 x 30 each 6/10 RPE post session    07/25/24:Pt arrives for aquatic physical therapy. Treatment took place in 3.5-5.5 feet of water. Water temperature was 91  degrees F. Pt entered the pool via stairs independently. Seated water bench with 75% submersion Pt performed seated LE AROM exercises 20x in all planes, review of previous aquatic exercises, it has been > 6 mos so pt could not remember much -75% water depth for walking 3 ways, natural arms 6 lengths of each -Hip 3 ways 10x Bil holding onto the wall. Vc for control -high knee marching holding yellow noodle 4 lengths -Horseback bicycle on yellow noodle in the corner 2:30                                                                                                                             06/25/24 NuStep UE 10/ LE 8 level 3 for 6 min  Sit to Stand from chair x 8 with UE support with the last 3 reps Standing hip 3-way (flex, ext, abd) x 8 each with VC for proper technique & rest breaks between exercises with UE support in // bars  Standing high knees x 10 bilateral with UE support in // bars  Step taps 4 step x 12 bilaterally with UE support in // bars  LAQs 1# ankle weights 2 x 10 bilateral Seated adduction ball squeezes 3 hold x 10 with VC to not hold breath when squeezing ball Tandem balance in // bars 2 x 30 each SLS with one arm support in // bars 2 x 15     PATIENT EDUCATION: Education details: per today's note Person educated:  Patient Education method: Explanation, Demonstration, Tactile cues, Verbal cues, and Handouts Education comprehension: verbalized understanding and returned demonstration  HOME EXERCISE PROGRAM: Access Code: [email or web address] URL: [email or web address] Date: 06/23/2024 Prepared by: Saddie Raw  Exercises - Standing Hip Abduction with Counter Support  - 1 x daily - 5 x weekly - 2 sets - 10 reps - Side Stepping with Resistance at Ankles and Counter Support  - 1 x daily - 5 x weekly - 1 sets - 3-5 reps - Standing Hip Extension with Counter Support  - 1 x daily - 5 x weekly - 1-3 sets - 10 reps - 20-30 seconds hold - Seated Long Arc Quad with Ankle Weight  - 1 x daily - 5 x weekly - 2 sets - 10 reps - Sit to Stand  - 1 x daily - 5 x weekly - 2 sets - 10 reps - Seated Hamstring Stretch  - 1 x daily - 7 x weekly - 1-3 sets - 3 reps - 20-30 seconds hold  GOALS: Goals reviewed with patient? Yes  SHORT TERM GOALS: Target date: 06/23/24/2024  Patient to be independent with initial HEP. Baseline: HEP initiated Goal status: MET from previous PT sessions    LONG TERM GOALS: Target date: 08/18/24/2024  Patient to be independent with advanced HEP. Baseline: Not yet initiated  Goal status: INITIAL  Patient to demonstrate Rt LE strength >/=4/5.  Baseline: See above Goal status: INITIAL  -    Pt will improve  his walk test to being able to walk the full without rest and reaching at least 1089ft  Baseline: 572ft     ASSESSMENT:  CLINICAL IMPRESSION: Pt has not had a land treatment since 06/25/24 due to travel.  He has not been as consistent with his HEP due to this. PT encouraged consistency with HEP to improve strength and endurance.  He is working 2.5 days/week and reports some fatigue with this.  He did well with advancement of weights with exercises today.  PT monitored throughout session and pt was significantly challenged by level of activity.  Patient will benefit from  skilled PT to address the below impairments and improve overall function.    OBJECTIVE IMPAIRMENTS: Abnormal gait, decreased balance, difficulty walking, decreased ROM, decreased strength, impaired flexibility, impaired sensation, postural dysfunction, and pain.   ACTIVITY LIMITATIONS: carrying, lifting, bending, standing, squatting, stairs, transfers, bathing, toileting, dressing, locomotion level, and caring for others  PARTICIPATION LIMITATIONS: meal prep, cleaning, laundry, shopping, community activity, occupation, yard work, and church  PERSONAL FACTORS: Age, Past/current experiences, Time since onset of injury/illness/exacerbation, and 3+ comorbidities: HFrEF, atrial fibrillation, VF cardiac arrest s/p ICD, CAD, severe aortic regurgitation s/p AVR 10/2018 and aortic root replacement with CABGx1, aortic dilation, hypertension, hypothyroidism, type 2 diabetes, multiple myeloma, CKD IIIb-IV, migraine are also affecting patient's functional outcome.   REHAB POTENTIAL: Good  CLINICAL DECISION MAKING: LOW  EVALUATION COMPLEXITY: LOW  PLAN:  PT FREQUENCY: 2x/week  PT DURATION: 8 weeks   PLANNED INTERVENTIONS: [id number]- PT Re-evaluation, 97110-Therapeutic exercises, 97530- Therapeutic activity, 97112- Neuromuscular re-education, 97535- Self Care, [id number]- Manual therapy, [phone]- Gait training, [phone]- Canalith repositioning, [phone]- Aquatic Therapy, Patient/Family education, Balance training, Stair training, Taping, Dry Needling, Joint mobilization, Spinal mobilization, Vestibular training, DME instructions, Cryotherapy, and Moist heat  PLAN FOR NEXT SESSION: general strength like nustep, Rt LE general but hip focus, and general bil UE (no sig weakness here), 6 min walk test     Burnard Joy, PT 07/30/24 10:16 AM

## 2024-07-31 NOTE — Progress Notes (Signed)
 " Cardiology Office Note:  .   Date:  08/01/2024  ID:  Eric Cyrus Raddle., MD, DOB [birth year], MRN [id number] PCP: Jarold Medici, MD  [facility] Providers Cardiologist:  Annabella Scarce, MD Electrophysiologist:  Danelle Birmingham, MD    History of Present Illness: .    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 male with HFrEF, atrial fibrillation, aortic root aneurysm, VF cardiac arrest s/p ICD, CAD, severe aortic regurgitation s/p AVR 10/2018 and aortic root replacement with CABGx1, aortic dilation, hypertension, hypothyroidism, type 2 diabetes, multiple myeloma, CKD IIIb-IV, who presents for follow-up today. He is a practicing ophthalmologist   Prior ventricular fibrillation cardiac arrest in 2017 at which time CPR was provided by his wife with residual RLE numbness and peripheral retinopathy. ICD placed at that time (follows with Dr. Birmingham). He followed routinely with Dr. Claudene regarding aortic regurgitation, and on 11/20/2018 underwent aortic valve replacement and aortic root replacement with bioprosthetic valve/aortic graft and single-vessel coronary grafting with saphenous vein.  Subsequent sternal wound infection required sternal flap and prolonged antibiotics.  At visit 12/2021 SGLT2i was added.     At his last visit with Dr. Claudene 06/19/2022 he was recommended for updated echo. Echo performed 08/25/2022 showed LVEF 30-35%, RWMA, LV moderately dilated, RV moderately enlarged, moderately elevated PASP, bilateral atria severely dilated, aortic valve s/p bioprosthetic valve with normal structure and function, moderate dilation aortic root 48 mm, ascending aorta 38 mm.   He was seen by Reche Finder, NP 02/23/2023. Home blood pressures were mostly in the 130s systolic. When on chemotherapy he would retain fluid and require Lasix  80mg  QD. They reduced his Lasix  from 80mg  every day, to 80mg  and 40mg  QOD for protection of renal function.   At his visit 06/2023 he was feeling well.  BP was running low and he  noted dizziness BiDil  so he was not using it regularly.  This was discontinued.  He was referred to water physical therapy.  Since his last appointment his wife passed away.  Discussed the use of AI scribe software for clinical note transcription with the patient, who gave verbal consent to proceed.  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 He takes Lasix  80 mg daily, which is effective but causes nocturia when taken in the evening. He also notes decreased endurance, particularly when carrying a golf bag, and is trying to increase his physical activity.  He has no exertional chest pain and notes that his endurance decreased after he stopped walking and going to PT.   He participates in physical therapy and pool exercises, which he finds beneficial for his strength and bones, although he notes the cold temperature when exiting the pool. He has not been walking long distances as much recently, especially since his wife's hospitalization, but is trying to increase his activity level.  He experienced a recent episode of his BiV ICD vibrating, attributed to a low battery. He is scheduled for an ICD replacement on December 5th. He has a history of valve replacement and follows up annually for this. His last echocardiogram in October showed stable ejection fraction and moderate mitral regurgitation.  He is currently on carvedilol , Farxiga , Lasix , and olmesartan, with the latter added by his nephrologist along with calcium . He is monitoring his fluid and salt intake, noting that he can taste salt more now that he is eating less of it. His weight has fluctuated slightly, with a recent increase to 199  pounds from a consistent 190-191 pounds.  He reports an umbilical hernia that he did not have previously and is unsure if there is a visible difference. He also notes neck vein distension.  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 After his aortic  valve and root replacement.  Echo 06/2024 revealed LVEF 35-40% with sinus of Valvsalva 5cm unchanged form prior.  Mild RV dysfunction.  There was moderate MR, mild PR, moderate TR.  RVSP 45 mmHg.  Bioprosthetic AVR functioning well.  Chest CT Ascending aorta 4 cm incfreased from 3.8 cm in 2022.   No shortness of breath when lying down, and no chest pain or pressure.  ROS:  As per HPI  Studies Reviewed: SABRA   EKG Interpretation Date/Time:  Friday August 01 2024 08:36:19 EST Ventricular Rate:  70 PR Interval:    QRS Duration:  144 QT Interval:  468 QTC Calculation: 505 R Axis:   -70  Text Interpretation: Ventricular-paced rhythm When compared with ECG of 18-Feb-2016 07:04, Electronic ventricular pacemaker has replaced Sinus rhythm Confirmed by Raford Riggs ([id number]) on 08/01/2024 9:41:02 AM   Echo 07/01/24: MODERATE LEFT VENTRICULAR SYSTOLIC DYSFUNCTION WITH MILD LVH, LV SIZE IS MODERATELY ENLARGED  ESTIMATED EF: 35%, CALC EF(3D): 41%  DIASTOLIC FUNCTION CAN'T BE DETERMINED  MILD RIGHT VENTRICULAR SYSTOLIC DYSFUNCTION  VALVULAR REGURGITATION: No AR, MODERATE MR, MILD PR, MODERATE TR  ESTIMATED RVSP: 45 mmHg (ABNORMAL)  VALVULAR STENOSIS: BIOPROSTHETIC AoV, No MS, No PS, No TS  Aortic valve mean gradient 3 mmHg.   Chest CT-A 07/01/24: Impression:   The sinuses of Valsalva measures up to 45 x 47 x 50 mm, unchanged compared  to 05/24/2021. Mild increase in tubular ascending thoracic aorta measuring  40 x 38 mm, previously measuring 38 x 36 mm from 05/24/2021.    Risk Assessment/Calculations:    CHA2DS2-VASc Score = 5   This indicates a 7.2% annual risk of stroke. The patient's score is based upon: CHF History: 1 HTN History: 1 Diabetes History: 1 Stroke History: 0 Vascular Disease History: 1 Age Score: 1 Gender Score: 0            Physical Exam:   VS:  BP 118/86 (BP Location: Left Arm, Patient Position: Sitting, Cuff Size: Normal)   Pulse 70   Ht 5' 9 (1.753 m)   Wt  199 lb 12.8 oz (90.6 kg)   SpO2 98%   BMI 29.51 kg/m  , BMI Body mass index is 29.51 kg/m. GENERAL:  Well appearing HEENT: Pupils equal round and reactive, fundi not visualized, oral mucosa unremarkable NECK:  + jugular venous distention, waveform within normal limits, carotid upstroke brisk and symmetric, no bruits, no thyromegaly LUNGS:  Clear to auscultation bilaterally HEART:  RRR.  PMI not displaced or sustained,S1 and S2 within normal limits, no S3, no S4, no clicks, no rubs, III/VI systolic murmur at the LUSB and apex ABD:  Flat, positive bowel sounds normal in frequency in pitch, no bruits, no rebound, no guarding, no midline pulsatile mass, no hepatomegaly, no splenomegaly EXT:  2 plus pulses throughout, 2+ LE edema to the ankles, no cyanosis no clubbing SKIN:  No rashes no nodules NEURO:  Cranial nerves II through XII grossly intact, motor grossly intact throughout PSYCH:  Cognitively intact, oriented to person place and time   ASSESSMENT AND PLAN: .    Assessment & Plan # Acute on chronic HFrEF:  # Moderate mitral and tricuspid regurgitation LVEF 35-40%.  Increased lower extremity edema likely due to  heart failure and venous insufficiency. Current diuretic regimen effective but causes nocturia. - Advised taking Lasix  earlier in the day, especially on non-working days. - Recommended wearing true compression socks, preferably fitted at a medical supply store. - Monitor weight and consider a second dose of Lasix  on non-working days if weight remains elevated. - Continue current medications: carvedilol , Farxiga , Lasix , and olmesartan.  #Atypical atrial flutter and left bundle branch block # ICD:  CRT-D generator near end of life with recent vibrations indicating battery depletion. Scheduled for replacement on December 12th. MRI compatibility to be confirmed by cardiologist. - Proceed with pacemaker replacement on December 12th. - Will confirm MRI compatibility with EP during  upcoming appointment. - Continue MRI - Continue Eliquis , amiodarone  and carvedilol .   # Aortic root aneurysm:  Continue f/u with Vascular surgery.  Stable at 5 cm.  Recommend MRI compatible CRT-D so this can be followed with MRI.   # CKD 3b:  Chronic kidney disease well-managed with current therapy. Kidney function stable with olmesartan and calcium  supplementation. - Continue current therapy with olmesartan and calcium .  # Essential hypertension, well controlled Blood pressure well controlled with current medication regimen. - Continue current antihypertensive regimen.  Carvedilol , olmesartan.  BP and renal function prohibit spironolactone .   # Amiodarone  therapy: monitoring for adverse effects (liver, thyroid , pulmonary) Amiodarone  therapy ongoing with monitoring for adverse effects. Liver function tests normal. TSH slightly elevated but thyroid  function stable. - Ordered pulmonary function tests to assess for any impairment. - Continue monitoring liver and thyroid  function.     Dispo: f/u 09/2024  Signed, Annabella Scarce, MD   "

## 2024-08-01 ENCOUNTER — Ambulatory Visit (INDEPENDENT_AMBULATORY_CARE_PROVIDER_SITE_OTHER): Admitting: Cardiovascular Disease

## 2024-08-01 ENCOUNTER — Encounter (HOSPITAL_BASED_OUTPATIENT_CLINIC_OR_DEPARTMENT_OTHER): Payer: Self-pay | Admitting: Cardiovascular Disease

## 2024-08-01 ENCOUNTER — Ambulatory Visit: Admitting: Physical Therapy

## 2024-08-01 VITALS — BP 118/86 | HR 70 | Ht 69.0 in | Wt 199.8 lb

## 2024-08-01 DIAGNOSIS — I428 Other cardiomyopathies: Secondary | ICD-10-CM

## 2024-08-01 DIAGNOSIS — I447 Left bundle-branch block, unspecified: Secondary | ICD-10-CM | POA: Diagnosis not present

## 2024-08-01 DIAGNOSIS — I5022 Chronic systolic (congestive) heart failure: Secondary | ICD-10-CM | POA: Diagnosis not present

## 2024-08-01 DIAGNOSIS — I484 Atypical atrial flutter: Secondary | ICD-10-CM | POA: Diagnosis not present

## 2024-08-01 DIAGNOSIS — Z79899 Other long term (current) drug therapy: Secondary | ICD-10-CM | POA: Diagnosis not present

## 2024-08-01 DIAGNOSIS — I5042 Chronic combined systolic (congestive) and diastolic (congestive) heart failure: Secondary | ICD-10-CM

## 2024-08-01 DIAGNOSIS — I10 Essential (primary) hypertension: Secondary | ICD-10-CM

## 2024-08-01 MED ORDER — FUROSEMIDE 80 MG PO TABS: 80.0000 mg | ORAL_TABLET | Freq: Every day | ORAL | 3 refills | Status: AC

## 2024-08-01 NOTE — Addendum Note (Signed)
 Addended by: FREDIRICK BEAU B on: 08/01/2024 01:41 PM   Modules accepted: Orders

## 2024-08-01 NOTE — Patient Instructions (Addendum)
 Medication Instructions:  HAVE UPDATED YOUR FUROSEMIDE  PRESCRIPTION SO THAT YOU CAN TAKE EXTRA AS NEEDED  IF YOUR WEIGHT IS STILL AT 199 NEXT WEEK TAKE TWICE A DAY FOR 2 DAYS   *If you need a refill on your cardiac medications before your next appointment, please call your pharmacy*  Lab Work: NONE  Testing/Procedures: Your physician has recommended that you have a pulmonary function test. Pulmonary Function Tests are a group of tests that measure how well air moves in and out of your lungs.  Follow-Up: At [facility], you and your health needs are our priority.  As part of our continuing mission to provide you with exceptional heart care, our providers are all part of one team.  This team includes your primary Cardiologist (physician) and Advanced Practice Providers or APPs (Physician Assistants and Nurse Practitioners) who all work together to provide you with the care you need, when you need it.  Your next appointment:   10/01/2024 9:20 AM WITH DR Onancock   TRY COMPRESSION SOCKS AS DISCUSSED

## 2024-08-04 ENCOUNTER — Ambulatory Visit: Admitting: Rehabilitation

## 2024-08-04 ENCOUNTER — Encounter: Payer: Self-pay | Admitting: Rehabilitation

## 2024-08-04 DIAGNOSIS — R2681 Unsteadiness on feet: Secondary | ICD-10-CM

## 2024-08-04 DIAGNOSIS — M6281 Muscle weakness (generalized): Secondary | ICD-10-CM | POA: Diagnosis not present

## 2024-08-04 DIAGNOSIS — R2689 Other abnormalities of gait and mobility: Secondary | ICD-10-CM | POA: Diagnosis not present

## 2024-08-04 NOTE — Therapy (Signed)
 OUTPATIENT PHYSICAL THERAPY TREATMENT   Patient Name: Eric [name]., MD MRN: [id number] DOB:June 08, 1951, 73 y.o., male Today's Date: 08/04/2024   PCP:  Catheryn Slocumb, MD  REFERRING PROVIDER: Catheryn Slocumb, MD   END OF SESSION:  PT End of Session - 08/04/24 1112     Visit Number 6    Number of Visits 17    Date for Recertification  08/18/24    PT Start Time 0904    PT Stop Time 0956    PT Time Calculation (min) 52 min    Activity Tolerance Patient tolerated treatment well    Behavior During Therapy [facility] for tasks assessed/performed               Past Medical History:  Diagnosis Date   Acute blood loss anemia    Acute encephalopathy    Acute hypoxemic respiratory failure (HCC)    Acute idiopathic gout of right ankle    Acute lumbar back pain    Acute on chronic systolic heart failure (HCC)    Acute respiratory failure (HCC)    AKI (acute kidney injury)    Altered mental status    Aortic aneurysm without rupture 02/09/2016   Aortic root enlargement 02/13/2012   Aortic valve regurgitation, acquired 02/13/2012   Arrhythmia    Atrial flutter (HCC) 03/18/2012   Back pain    Cardiac arrest (HCC) 02/01/2016   CHF (congestive heart failure) (HCC)    Chronic anticoagulation 03/04/2013   Chronic combined systolic and diastolic heart failure (HCC)    Chronic kidney disease    kidney fx studies increased    Chronic lower back pain    Chronic renal insufficiency    Chronic systolic heart failure (HCC) 02/13/2012   Recent diagnosis 4 / 2013, LVEF 25% by Echo 12/2011  03/2013: Echo at [facility] Conclusions: 1. Left ventricular ejection fraction estimated by 2D at 40-45 percent. 2. Mild concentric left ventricular hypertrophy. 3. Mild left atrial enlargement. 4. Moderate aortic valve regurgitation. 5. The aortic root at the sinus(es) of valsalva is moderately dilated 6. Mild mitral valve regurgitation. 7.   CKD (chronic kidney disease)    CKD (chronic kidney  disease), stage IV (HCC) 08/06/2013   Creatinine 2.4 on 07/04/13    Claustrophobia    Colon cancer screening 03/04/2013   Debility 02/22/2016   Diabetes mellitus type 2 in nonobese [facility])    Diabetes mellitus type 2 in obese    Dysrhythmia    palpitations   Encounter for central line placement    Exertional dyspnea 01/2012   Femoral nerve injury 02/22/2016   Femoral neuropathy    Goals of care, counseling/discussion 05/05/2020   HCAP (healthcare-associated pneumonia)    Heart murmur    Hyperlipidemia 02/13/2012   Hypertension    Hypothyroidism    Internal hemorrhoids without mention of complication 04/11/2013   Labile blood pressure    Left bundle branch block 02/13/2012   Leg weakness, bilateral    Long term (current) use of anticoagulants 08/06/2013   Eliquis  therapy    Long term current use of amiodarone  08/07/2016   Lower extremity weakness    Migraine 02/13/2012   opthalmic   Multiple myeloma (HCC) 05/05/2020   Non-traumatic rhabdomyolysis    Obesity (BMI 30-39.9) 02/13/2012   Pain    Paroxysmal atrial fibrillation (HCC)    Pneumonia    Retroperitoneal bleed    Right ankle pain    Right knee pain    Severe aortic regurgitation 02/09/2016  Special screening for malignant neoplasms, colon 04/11/2013   Thyroid  activity decreased    Tibial pain    Varicose vein of leg    right   Ventricular fibrillation (HCC) 02/09/2016   Weakness of both lower extremities    Past Surgical History:  Procedure Laterality Date   AORTIC VALVE REPLACEMENT  11/20/2018   CARDIAC CATHETERIZATION N/A 02/17/2016   Procedure: Left Heart Cath and Coronary Angiography;  Surgeon: Candyce GORMAN Reek, MD;  Location: [facility] INVASIVE CV LAB;  Service: Cardiovascular;  Laterality: N/A;   CARDIOVERSION  03/22/2012   Procedure: CARDIOVERSION;  Surgeon: Oneil Parchment, MD;  Location: [facility] ENDOSCOPY;  Service: Cardiovascular;  Laterality: N/A;   CARDIOVERSION  04/19/2012   Procedure: CARDIOVERSION;   Surgeon: Victory LELON Claudene DOUGLAS, MD;  Location: [facility] OR;  Service: Cardiovascular;  Laterality: N/A;   COLONOSCOPY N/A 04/11/2013   Procedure: COLONOSCOPY;  Surgeon: Lamar JONETTA Aho, MD;  Location: [facility] ENDOSCOPY;  Service: Endoscopy;  Laterality: N/A;   COLONOSCOPY N/A 04/11/2013   Procedure: COLONOSCOPY;  Surgeon: Lamar JONETTA Aho, MD;  Location: [facility] ENDOSCOPY;  Service: Endoscopy;  Laterality: N/A;   EP IMPLANTABLE DEVICE N/A 02/17/2016   Procedure: BiV ICD Insertion CRT-D;  Surgeon: Danelle LELON Birmingham, MD;  Location: [facility] INVASIVE CV LAB;  Service: Cardiovascular;  Laterality: N/A;   FINGER SURGERY  2012   4th digit right hand; thumb on left hand   RADIOLOGY WITH ANESTHESIA N/A 02/11/2016   Procedure: MRI OF THE BRAIN WITHOUT CONTRAST, LUMBAR WITHOUT CONTRAST;  Surgeon: Medication Radiologist, MD;  Location: [facility] OR;  Service: Radiology;  Laterality: N/A;  DR. WOOD/MRI   RIGHT/LEFT HEART CATH AND CORONARY ANGIOGRAPHY N/A 07/26/2018   Procedure: RIGHT/LEFT HEART CATH AND CORONARY ANGIOGRAPHY;  Surgeon: Claudene Victory LELON, MD;  Location: [facility] INVASIVE CV LAB;  Service: Cardiovascular;  Laterality: N/A;   Skin melanocytoma excision  2012   above left clavicle   STERNAL INCISION RECLOSURE  11/2018   STERNAL WIRE REMOVAL  11/2018   STERNAL WOUND DEBRIDEMENT  11/2018   TEE WITHOUT CARDIOVERSION  03/22/2012   Procedure: TRANSESOPHAGEAL ECHOCARDIOGRAM (TEE);  Surgeon: Oneil Parchment, MD;  Location: [facility] ENDOSCOPY;  Service: Cardiovascular;  Laterality: N/A;   TEE WITHOUT CARDIOVERSION N/A 02/08/2016   Procedure: TRANSESOPHAGEAL ECHOCARDIOGRAM (TEE);  Surgeon: Redell GORMAN Shallow, MD;  Location: [facility] ENDOSCOPY;  Service: Cardiovascular;  Laterality: N/A;   Patient Active Problem List   Diagnosis Date Noted   Umbilical hernia without obstruction and without gangrene 05/11/2024   Chalazion of right upper eyelid 05/11/2024   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 05/11/2024   Venous stasis 05/11/2024    Adjustment insomnia 01/01/2024   Presence of heart assist device (HCC) 12/31/2023   Enlarged prostate 09/10/2023   Dyslipidemia due to type 2 diabetes mellitus (HCC) 09/10/2023   Acquired thrombophilia 05/14/2023   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   Goals of care, counseling/discussion 05/05/2020   Multiple myeloma (HCC) 05/05/2020   S/P flap graft 01/07/2019   Coronary artery disease 01/07/2019   Long term (current) use of antibiotics 01/03/2019   Essential hypertension 12/27/2018   Postoperative infection of wound of sternum 12/27/2018   Decreased strength, endurance, and mobility 12/27/2018   Cardiac LV ejection fraction 30-35% 12/27/2018   Decreased activities of daily living (ADL) 12/27/2018   Klebsiella infection 12/27/2018   S/P AVR (aortic valve replacement) 11/23/2018   S/P ascending aortic aneurysm repair 11/23/2018   NICM (nonischemic cardiomyopathy) (HCC) 11/19/2018   AICD (automatic  cardioverter/defibrillator) present 11/19/2018   Other symptoms and signs involving the musculoskeletal system 05/08/2018   Long term current use of amiodarone  08/07/2016   Type 2 diabetes mellitus with stage 3b chronic kidney disease, without long-term current use of insulin  (HCC)    Femoral neuropathy    Leg weakness, bilateral    PAF (paroxysmal atrial fibrillation) (HCC)    Thyroid  activity decreased    Arrhythmia    Long term current use of anticoagulant 03/04/2013   Atrial flutter (HCC) 03/18/2012    Class: Acute   Chronic combined systolic and diastolic CHF (congestive heart failure) (HCC) 02/13/2012    Class: Acute   Hypertension, accelerated 02/13/2012   Hyperlipidemia 02/13/2012   Left bundle branch block 02/13/2012   Obesity (BMI 30-39.9) 02/13/2012    ONSET DATE:   REFERRING DIAG:  Diagnosis R53.1,Z74.09,R68.89 (ICD-10-CM) - Decreased strength, endurance, and mobility   THERAPY DIAG:  Unsteadiness on feet  Muscle weakness (generalized)  Other  abnormalities of gait and mobility  Rationale for Evaluation and Treatment: Rehabilitation  SUBJECTIVE:                                                                                                                                                                                             SUBJECTIVE STATEMENT:  Patient reports his legs are sore today. On Saturday he was walking at [facility] when he tripped over something sticking out of the ground and fell onto his left leg and hip. He reports his left leg, hip and back are bothering him today. He denies hitting his head when he fell.   Eval: I have peripheral neuropathy in the Rt leg.  I am also noticing some weakness in both of the arms maybe from taking the ozempic .  I may want to do more aquatic therapy.  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  I have tingling on the inner groin, knee and front of the leg.  Nothing really in the foot.   I am playing 9 holes of golf lately.  I walk at home.  I go back to the [employer] to ride the bike.    PERTINENT HISTORY: , atrial fibrillation, cardiac arrest s/p ICD, CAD, severe aortic regurgitation s/p AVR 10/2018 and aortic root replacement with CABGx1, aortic dilation, hypertension, hypothyroidism, type 2 diabetes, multiple myeloma, CKD IIIb-IV, migraine  PAIN:  Are you having pain? Yes: NPRS scale: Yes - on left knee and occasionally in lumbar spine 5-6/10  PRECAUTIONS: Fall and ICD/Pacemaker  RED FLAGS: None   WEIGHT BEARING RESTRICTIONS: No  FALLS: Has patient fallen in  last 6 months? No  LIVING ENVIRONMENT: Lives with: alone  Lives in: House/apartment Stairs: 4 steps to enter; 3 story home Has following equipment at home: Shower bench, and raised commode  PLOF: Independent;  working part time as Ophthalmologist- requires sitting, standing, walking   PATIENT GOALS: improve R LE strength and bil UE strength   OBJECTIVE:  Note: Objective measures  were completed at Evaluation unless otherwise noted.  DIAGNOSTIC FINDINGS: none recent  COGNITION: Overall cognitive status: Within functional limits for tasks assessed   SENSATION: Intact in B LEs to light touch   POSTURE: rounded shoulders and forward head  LOWER EXTREMITY ROM:     Active  Right Eval Left Eval  Hip flexion    Hip extension    Hip abduction    Hip adduction    Hip internal rotation    Hip external rotation    Knee flexion    Knee extension    Ankle dorsiflexion    Ankle plantarflexion    Ankle inversion    Ankle eversion     (Blank rows = not tested)  LOWER EXTREMITY MMT:    MMT Right Eval Left Eval  Hip flexion 3+ 4  Hip extension    Hip abduction 4+ 4+  Hip adduction 4 4  Hip internal rotation 4+ 4+  Hip external rotation 4- 4  Knee flexion 5 5  Knee extension 4- 4+  Ankle dorsiflexion 5 5  Ankle plantarflexion 4+ 4+  Ankle inversion    Ankle eversion    (Blank rows = not tested)  UPPER EXTREMITY MMT:  MMT Right eval Left eval  Shoulder flexion 4 4  Shoulder extension    Shoulder abduction 4 4  Shoulder adduction    Shoulder extension    Shoulder internal rotation 5 5  Shoulder external rotation 5 5  Middle trapezius    Lower trapezius    Elbow flexion 4+ 5  Elbow extension 4+ 4+  Wrist flexion    Wrist extension    Wrist ulnar deviation    Wrist radial deviation    Wrist pronation    Wrist supination    Grip strength 58 40   (Blank rows = not tested)  GAIT: Gait pattern: Significant R-ward trunk lean with gait; slightly slowed gait speed  Assistive device utilized: None Level of assistance: Modified independence  FUNCTIONAL TESTS:  5x sit to stand:   walk: able to walk 545ft stopping due to the Rt leg feeling tight.  SOB level 4/10  06/25/24 SL stance:  Rt: unable to perform without UE support; with  single arm support able to hold for 20 Lt: 3 seconds without UE support; 20 with single arm UE support    Tandem stance:  Rt: 20 with single arm UE support  Lt: 30 without UE support    TODAY'S TREATMENT:    08/04/24 NuStep UE 12/ LE 8 level 3 for 6 min - PT present to discuss progress  Standing hip 3-way (flex, ext, abd) with 2 # AW - 2x10 for flex & abd, 1x10 for ext due to increase lumbar pain - VC for proper technique and  UE support in // bars  Sidestepping in // bars x 2 laps  LAQs 2# ankle weights 2 x 10 bilateral Tandem balance in // bars 2 x 30 each Manual Therapy: STM to Lt paraspinals, Lt glut and Lt oblique with pt S/L  07/30/24 NuStep UE 12/ LE 8 level 3 for 6 min-PT present  to discuss progress- stopped arms at 4 min due to arm pain Sit to Stand from chair x 8 with UE support with the last 3 reps Standing hip 3-way (flex, ext, abd) 2x10 each with VC for proper technique & rest breaks between exercises with UE support in // bars - added 2#  Sidestepping in // bars x 2 laps  Step taps 6 step x 10 bilaterally with UE support in // bars  LAQs 2# ankle weights 2 x 10 bilateral Seated adduction ball squeezes 3 hold 2x10 with VC to not hold breath when squeezing ball Tandem balance in // bars 2 x 30 each 6/10 RPE post session   07/25/24:Pt arrives for aquatic physical therapy. Treatment took place in 3.5-5.5 feet of water. Water temperature was 91 degrees F. Pt entered the pool via stairs independently. Seated water bench with 75% submersion Pt performed seated LE AROM exercises 20x in all planes, review of previous aquatic exercises, it has been > 6 mos so pt could not remember much -75% water depth for walking 3 ways, natural arms 6 lengths of each -Hip 3 ways 10x Bil holding onto the wall. Vc for control -high knee marching holding yellow noodle 4 lengths -Horseback bicycle on yellow noodle in the corner 2:30                                                                                                                   06/25/24 NuStep UE 10/ LE 8 level 3 for 6 min   Sit to Stand from chair x 8 with UE support with the last 3 reps Standing hip 3-way (flex, ext, abd) x 8 each with VC for proper technique & rest breaks between exercises with UE support in // bars  Standing high knees x 10 bilateral with UE support in // bars  Step taps 4 step x 12 bilaterally with UE support in // bars  LAQs 1# ankle weights 2 x 10 bilateral Seated adduction ball squeezes 3 hold x 10 with VC to not hold breath when squeezing ball Tandem balance in // bars 2 x 30 each SLS with one arm support in // bars 2 x 15     PATIENT EDUCATION: Education details: per today's note Person educated: Patient Education method: Explanation, Demonstration, Tactile cues, Verbal cues, and Handouts Education comprehension: verbalized understanding and returned demonstration  HOME EXERCISE PROGRAM: Access Code: [email or web address] URL: [email or web address] Date: 06/23/2024 Prepared by: Saddie Raw  Exercises - Standing Hip Abduction with Counter Support  - 1 x daily - 5 x weekly - 2 sets - 10 reps - Side Stepping with Resistance at Ankles and Counter Support  - 1 x daily - 5 x weekly - 1 sets - 3-5 reps - Standing Hip Extension with Counter Support  - 1 x daily - 5 x weekly - 1-3 sets - 10 reps - 20-30 seconds hold - Seated Long Arc Quad with Ankle Weight  - 1 x daily - 5 x weekly - 2 sets -  10 reps - Sit to Stand  - 1 x daily - 5 x weekly - 2 sets - 10 reps - Seated Hamstring Stretch  - 1 x daily - 7 x weekly - 1-3 sets - 3 reps - 20-30 seconds hold  GOALS: Goals reviewed with patient? Yes  SHORT TERM GOALS: Target date: 06/23/24/2024  Patient to be independent with initial HEP. Baseline: HEP initiated Goal status: MET from previous PT sessions    LONG TERM GOALS: Target date: 08/18/24/2024  Patient to be independent with advanced HEP. Baseline: Not yet initiated  Goal status: INITIAL  Patient to demonstrate Rt LE strength >/=4/5.  Baseline: See above Goal status:  INITIAL  -    Pt will improve his walk test to being able to walk the full without rest and reaching at least 103ft  Baseline: 537ft     ASSESSMENT:  CLINICAL IMPRESSION: Pt reports increased pain today after falling and landing on his Lt hip and knee on Saturday. He denies hitting his head when falling and reports he tripped over a metal post sticking out of the ground. The patient reports increased pain in the lumbar spine with hip extension and is only able to tolerate one set of ten. Patient appears to have more difficulty during tandem balance with the Rt leg forward compared to the Lt with increased ankle and hip strategies required. Patient tolerates the addition of manual therapy well with reduced pain following. Patient will benefit from skilled PT to address the below impairments and improve overall function.    OBJECTIVE IMPAIRMENTS: Abnormal gait, decreased balance, difficulty walking, decreased ROM, decreased strength, impaired flexibility, impaired sensation, postural dysfunction, and pain.   ACTIVITY LIMITATIONS: carrying, lifting, bending, standing, squatting, stairs, transfers, bathing, toileting, dressing, locomotion level, and caring for others  PARTICIPATION LIMITATIONS: meal prep, cleaning, laundry, shopping, community activity, occupation, yard work, and church  PERSONAL FACTORS: Age, Past/current experiences, Time since onset of injury/illness/exacerbation, and 3+ comorbidities: HFrEF, atrial fibrillation, VF cardiac arrest s/p ICD, CAD, severe aortic regurgitation s/p AVR 10/2018 and aortic root replacement with CABGx1, aortic dilation, hypertension, hypothyroidism, type 2 diabetes, multiple myeloma, CKD IIIb-IV, migraine are also affecting patient's functional outcome.   REHAB POTENTIAL: Good  CLINICAL DECISION MAKING: LOW  EVALUATION COMPLEXITY: LOW  PLAN:  PT FREQUENCY: 2x/week  PT DURATION: 8 weeks   PLANNED INTERVENTIONS: [id number]- PT  Re-evaluation, 97110-Therapeutic exercises, 97530- Therapeutic activity, 97112- Neuromuscular re-education, 97535- Self Care, [id number]- Manual therapy, [phone]- Gait training, [phone]- Canalith repositioning, [phone]- Aquatic Therapy, Patient/Family education, Balance training, Stair training, Taping, Dry Needling, Joint mobilization, Spinal mobilization, Vestibular training, DME instructions, Cryotherapy, and Moist heat  PLAN FOR NEXT SESSION: general strength like nustep, Rt LE general but hip focus, and general bil UE (no sig weakness here), 6 min walk test   Randall Pack, SPT  08/04/24 11:13 AM   I agree with the following treatment note after reviewing documentation. This session was performed under the supervision of a licensed clinician.  Saddie Raw, PT 08/04/24, 11:14 AM

## 2024-08-06 ENCOUNTER — Encounter: Payer: Self-pay | Admitting: Internal Medicine

## 2024-08-06 ENCOUNTER — Ambulatory Visit: Attending: Student in an Organized Health Care Education/Training Program | Admitting: Internal Medicine

## 2024-08-06 VITALS — BP 122/88 | HR 68 | Ht 69.0 in | Wt 201.9 lb

## 2024-08-06 DIAGNOSIS — I130 Hypertensive heart and chronic kidney disease with heart failure and stage 1 through stage 4 chronic kidney disease, or unspecified chronic kidney disease: Secondary | ICD-10-CM | POA: Insufficient documentation

## 2024-08-06 DIAGNOSIS — Z01812 Encounter for preprocedural laboratory examination: Secondary | ICD-10-CM | POA: Diagnosis not present

## 2024-08-06 DIAGNOSIS — I5022 Chronic systolic (congestive) heart failure: Secondary | ICD-10-CM | POA: Insufficient documentation

## 2024-08-06 DIAGNOSIS — I480 Paroxysmal atrial fibrillation: Secondary | ICD-10-CM | POA: Diagnosis not present

## 2024-08-06 NOTE — Progress Notes (Signed)
 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 He had a sternal wound infection after his aortic valve replacement which finally healed. No residual drainage or infection. He is back working. No ICD shocks. He was diagnosed with multiple myeloma, underwent chemotherapy and has been in remission. I saw him last over 3 years ago and in the interim he has reached ERI on his biv ICD. He notes some chronic peripheral edema and is followed by Dr. Raford. He lost his wife of over 40 years to complications of DM.  Allergies  Allergen Reactions   Lactose Diarrhea   Lactose Intolerance (Gi) Diarrhea     Current Outpatient Medications  Medication Sig Dispense Refill   acetaminophen  (TYLENOL ) 500 MG tablet Take 1,000 mg by mouth every 4 (four) hours as needed for moderate pain or headache.     allopurinol  (ZYLOPRIM ) 100 MG tablet Take 1 tablet (100 mg total) by mouth daily. 90 tablet 2   amiodarone  (PACERONE ) 100 MG tablet TAKE 1 TABLET(100 MG) BY MOUTH DAILY 90 tablet 1   apixaban  (ELIQUIS ) 5 MG TABS tablet Take 1 tablet (5 mg total) by mouth 2 (two) times daily. TAKE 1 TABLET(5 MG) BY MOUTH TWICE DAILY 180 tablet 1   atorvastatin  (LIPITOR) 40 MG tablet Take 1 tablet (40 mg total) by mouth daily. 90 tablet 1   calcitRIOL (ROCALTROL) 0.25 MCG capsule Take 0.25 mcg by mouth 3 (three) times a week.     carvedilol  (COREG ) 6.25 MG tablet Take 1 tablet (6.25 mg total) by mouth 2 (two) times daily. 180 tablet 1   cholecalciferol (VITAMIN D3) 25 MCG (1000 UNIT) tablet Take 5,000 Units by mouth daily.     dapagliflozin  propanediol (FARXIGA ) 10 MG TABS tablet TAKE 1 TABLET(10 MG) BY MOUTH DAILY BEFORE BREAKFAST 30 tablet 5   fluticasone  (FLONASE ) 50 MCG/ACT nasal spray Place 2 sprays into both nostrils daily. 16 g 2   furosemide  (LASIX ) 80 MG tablet Take 1 tablet (80  mg total) by mouth daily. OK TO TAKE EXTRA TABLET DAILY AS NEEDED FOR FLUID RETENTION(EDEMA) 110 tablet 3   Hypromellose (ARTIFICIAL TEARS OP) Place 1 drop into both eyes daily as needed (dry eyes).     levothyroxine  (SYNTHROID ) 75 MCG tablet Take 1 tablet (75 mcg total) by mouth daily before breakfast. 90 tablet 2   loperamide (IMODIUM) 2 MG capsule Take 2 mg by mouth as needed for diarrhea or loose stools.     neomycin -polymyxin b-dexamethasone  (MAXITROL) 3.5-10000-0.1 OINT 2 (two) times daily.     neomycin -polymyxin-dexamethasone  (MAXITROL) 0.1 % ophthalmic suspension Place 1 drop into the right eye 4 (four) times daily.     olmesartan (BENICAR) 5 MG tablet Take 5 mg by mouth daily.     OZEMPIC , 0.25 OR 0.5 MG/DOSE, 2 MG/3ML SOPN INJECT 0.5 MG UNDER THE SKIN ONCE WEEKLY 3 mL 2   potassium chloride  20 MEQ/15ML (10%) SOLN TAKE 15 ML BY MOUTH TWICE DAILY (Patient taking differently: Take 15 mLs by mouth daily.) 946 mL 1   temazepam  (RESTORIL ) 15 MG capsule Take 1 capsule (15 mg total) by mouth at bedtime as needed for sleep. 30 capsule 0   vitamin B-12 (CYANOCOBALAMIN) 100 MCG tablet Take 100 mcg by mouth daily.     vitamin C (ASCORBIC ACID) 500 MG tablet Take 500 mg by mouth daily.  No current facility-administered medications for this visit.     Past Medical History:  Diagnosis Date   Acute blood loss anemia    Acute encephalopathy    Acute hypoxemic respiratory failure (HCC)    Acute idiopathic gout of right ankle    Acute lumbar back pain    Acute on chronic systolic heart failure (HCC)    Acute respiratory failure (HCC)    AKI (acute kidney injury)    Altered mental status    Aortic aneurysm without rupture 02/09/2016   Aortic root enlargement 02/13/2012   Aortic valve regurgitation, acquired 02/13/2012   Arrhythmia    Atrial flutter (HCC) 03/18/2012   Back pain    Cardiac arrest (HCC) 02/01/2016   CHF (congestive heart failure) (HCC)    Chronic anticoagulation 03/04/2013    Chronic combined systolic and diastolic heart failure (HCC)    Chronic kidney disease    kidney fx studies increased    Chronic lower back pain    Chronic renal insufficiency    Chronic systolic heart failure (HCC) 02/13/2012   Recent diagnosis 4 / 2013, LVEF 25% by Echo 12/2011  03/2013: Echo at [facility] Conclusions: 1. Left ventricular ejection fraction estimated by 2D at 40-45 percent. 2. Mild concentric left ventricular hypertrophy. 3. Mild left atrial enlargement. 4. Moderate aortic valve regurgitation. 5. The aortic root at the sinus(es) of valsalva is moderately dilated 6. Mild mitral valve regurgitation. 7.   CKD (chronic kidney disease)    CKD (chronic kidney disease), stage IV (HCC) 08/06/2013   Creatinine 2.4 on 07/04/13    Claustrophobia    Colon cancer screening 03/04/2013   Debility 02/22/2016   Diabetes mellitus type 2 in nonobese [facility])    Diabetes mellitus type 2 in obese    Dysrhythmia    palpitations   Encounter for central line placement    Exertional dyspnea 01/2012   Femoral nerve injury 02/22/2016   Femoral neuropathy    Goals of care, counseling/discussion 05/05/2020   HCAP (healthcare-associated pneumonia)    Heart murmur    Hyperlipidemia 02/13/2012   Hypertension    Hypothyroidism    Internal hemorrhoids without mention of complication 04/11/2013   Labile blood pressure    Left bundle branch block 02/13/2012   Leg weakness, bilateral    Long term (current) use of anticoagulants 08/06/2013   Eliquis  therapy    Long term current use of amiodarone  08/07/2016   Lower extremity weakness    Migraine 02/13/2012   opthalmic   Multiple myeloma (HCC) 05/05/2020   Non-traumatic rhabdomyolysis    Obesity (BMI 30-39.9) 02/13/2012   Pain    Paroxysmal atrial fibrillation (HCC)    Pneumonia    Retroperitoneal bleed    Right ankle pain    Right knee pain    Severe aortic regurgitation 02/09/2016   Special screening for malignant neoplasms, colon  04/11/2013   Thyroid  activity decreased    Tibial pain    Varicose vein of leg    right   Ventricular fibrillation (HCC) 02/09/2016   Weakness of both lower extremities     ROS:   All systems reviewed and negative except as noted in the HPI.   Past Surgical History:  Procedure Laterality Date   AORTIC VALVE REPLACEMENT  11/20/2018   CARDIAC CATHETERIZATION N/A 02/17/2016   Procedure: Left Heart Cath and Coronary Angiography;  Surgeon: Candyce GORMAN Reek, MD;  Location: [facility] INVASIVE CV LAB;  Service: Cardiovascular;  Laterality: N/A;   CARDIOVERSION  03/22/2012  Procedure: CARDIOVERSION;  Surgeon: Oneil Parchment, MD;  Location: [facility] ENDOSCOPY;  Service: Cardiovascular;  Laterality: N/A;   CARDIOVERSION  04/19/2012   Procedure: CARDIOVERSION;  Surgeon: Victory LELON Claudene DOUGLAS, MD;  Location: [facility] OR;  Service: Cardiovascular;  Laterality: N/A;   COLONOSCOPY N/A 04/11/2013   Procedure: COLONOSCOPY;  Surgeon: Lamar JONETTA Aho, MD;  Location: [facility] ENDOSCOPY;  Service: Endoscopy;  Laterality: N/A;   COLONOSCOPY N/A 04/11/2013   Procedure: COLONOSCOPY;  Surgeon: Lamar JONETTA Aho, MD;  Location: [facility] ENDOSCOPY;  Service: Endoscopy;  Laterality: N/A;   EP IMPLANTABLE DEVICE N/A 02/17/2016   Procedure: BiV ICD Insertion CRT-D;  Surgeon: Danelle LELON Birmingham, MD;  Location: [facility] INVASIVE CV LAB;  Service: Cardiovascular;  Laterality: N/A;   FINGER SURGERY  2012   4th digit right hand; thumb on left hand   RADIOLOGY WITH ANESTHESIA N/A 02/11/2016   Procedure: MRI OF THE BRAIN WITHOUT CONTRAST, LUMBAR WITHOUT CONTRAST;  Surgeon: Medication Radiologist, MD;  Location: [facility] OR;  Service: Radiology;  Laterality: N/A;  DR. WOOD/MRI   RIGHT/LEFT HEART CATH AND CORONARY ANGIOGRAPHY N/A 07/26/2018   Procedure: RIGHT/LEFT HEART CATH AND CORONARY ANGIOGRAPHY;  Surgeon: Claudene Victory LELON, MD;  Location: [facility] INVASIVE CV LAB;  Service: Cardiovascular;  Laterality: N/A;   Skin melanocytoma excision  2012   above left clavicle   STERNAL  INCISION RECLOSURE  11/2018   STERNAL WIRE REMOVAL  11/2018   STERNAL WOUND DEBRIDEMENT  11/2018   TEE WITHOUT CARDIOVERSION  03/22/2012   Procedure: TRANSESOPHAGEAL ECHOCARDIOGRAM (TEE);  Surgeon: Oneil Parchment, MD;  Location: [facility] ENDOSCOPY;  Service: Cardiovascular;  Laterality: N/A;   TEE WITHOUT CARDIOVERSION N/A 02/08/2016   Procedure: TRANSESOPHAGEAL ECHOCARDIOGRAM (TEE);  Surgeon: Redell GORMAN Shallow, MD;  Location: [facility] ENDOSCOPY;  Service: Cardiovascular;  Laterality: N/A;     Family History  Problem Relation Age of Onset   Hypertension Mother    Heart disease Mother    Heart failure Mother    Diabetes Mother    Hypertension Father    Heart disease Father    Heart failure Father      Social History   Socioeconomic History   Marital status: Married    Spouse name: [name]   Number of children: 1   Years of education: Not on file   Highest education level: Not on file  Occupational History    Employer: [employer]  Tobacco Use   Smoking status: Never    Passive exposure: Past   Smokeless tobacco: Never  Vaping Use   Vaping status: Never Used  Substance and Sexual Activity   Alcohol use: Not Currently    Comment: rarely   Drug use: No   Sexual activity: Not Currently  Other Topics Concern   Not on file  Social History Narrative   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   Social Drivers of Corporate Investment Banker Strain: Low Risk  (02/20/2024)   Overall Financial Resource Strain (CARDIA)    Difficulty of Paying Living Expenses: Not hard at all  Food Insecurity: No Food Insecurity (08/01/2024)   Hunger Vital Sign    Worried About Running Out of Food in the Last Year: Never true    Ran Out of Food in the Last Year: Never true  Transportation Needs: No Transportation Needs (02/20/2024)   PRAPARE - Administrator, Civil Service (Medical): No    Lack of Transportation (Non-Medical): No  Physical Activity:  Insufficiently Active (02/20/2024)   Exercise Vital Sign    Days of Exercise per Week: 2 days    Minutes of Exercise per Session: 30 min  Stress: Stress Concern Present (02/20/2024)   [employer] of Occupational Health - Occupational Stress Questionnaire    Feeling of Stress : To some extent  Social Connections: Moderately Integrated (02/20/2024)   Social Connection and Isolation Panel    Frequency of Communication with Friends and Family: More than three times a week    Frequency of Social Gatherings with Friends and Family: Once a week    Attends Religious Services: 1 to 4 times per year    Active Member of [employer] or Organizations: Yes    Attends Banker Meetings: More than 4 times per year    Marital Status: Widowed  Intimate Partner Violence: Not At Risk (02/20/2024)   Humiliation, Afraid, Rape, and Kick questionnaire    Fear of Current or Ex-Partner: No    Emotionally Abused: No    Physically Abused: No    Sexually Abused: No     BP 122/88   Pulse 68   Ht 5' 9 (1.753 m)   Wt 201 lb 14.4 oz (91.6 kg)   SpO2 98%   BMI 29.82 kg/m   Physical Exam:  Well appearing NAD HEENT: Unremarkable Neck:  No JVD, no thyromegally Lymphatics:  No adenopathy Back:  No CVA tenderness Lungs:  Clear with no wheezes HEART:  Regular rate rhythm, with a split S2.  Abd:  soft, positive bowel sounds, no organomegally, no rebound, no guarding Ext:  2 plus pulses, no edema, no cyanosis, no clubbing Skin:  No rashes no nodules Neuro:  CN II through XII intact, motor grossly intact   DEVICE  Normal device function.  See PaceArt for details. ERI  Assess/Plan:  1. Atrial fib - his rate is well controlled. No change in meds. 2. VT - he will continue low dose amiodarone .  3. ICD - his St. Jude biv ICD is working normally. He is at [facility] and will undergo gen change out.  4. Chronic systolic heart failure - his symptoms remain class 2. He is encouraged to maintain a low sodium  diet and continue his diuretic.   Danelle Rudell Marlowe,MD

## 2024-08-06 NOTE — H&P (View-Only) (Signed)
 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 He had a sternal wound infection after his aortic valve replacement which finally healed. No residual drainage or infection. He is back working. No ICD shocks. He was diagnosed with multiple myeloma, underwent chemotherapy and has been in remission. I saw him last over 3 years ago and in the interim he has reached ERI on his biv ICD. He notes some chronic peripheral edema and is followed by Dr. Raford. He lost his wife of over 40 years to complications of DM.  Allergies  Allergen Reactions   Lactose Diarrhea   Lactose Intolerance (Gi) Diarrhea     Current Outpatient Medications  Medication Sig Dispense Refill   acetaminophen  (TYLENOL ) 500 MG tablet Take 1,000 mg by mouth every 4 (four) hours as needed for moderate pain or headache.     allopurinol  (ZYLOPRIM ) 100 MG tablet Take 1 tablet (100 mg total) by mouth daily. 90 tablet 2   amiodarone  (PACERONE ) 100 MG tablet TAKE 1 TABLET(100 MG) BY MOUTH DAILY 90 tablet 1   apixaban  (ELIQUIS ) 5 MG TABS tablet Take 1 tablet (5 mg total) by mouth 2 (two) times daily. TAKE 1 TABLET(5 MG) BY MOUTH TWICE DAILY 180 tablet 1   atorvastatin  (LIPITOR) 40 MG tablet Take 1 tablet (40 mg total) by mouth daily. 90 tablet 1   calcitRIOL (ROCALTROL) 0.25 MCG capsule Take 0.25 mcg by mouth 3 (three) times a week.     carvedilol  (COREG ) 6.25 MG tablet Take 1 tablet (6.25 mg total) by mouth 2 (two) times daily. 180 tablet 1   cholecalciferol (VITAMIN D3) 25 MCG (1000 UNIT) tablet Take 5,000 Units by mouth daily.     dapagliflozin  propanediol (FARXIGA ) 10 MG TABS tablet TAKE 1 TABLET(10 MG) BY MOUTH DAILY BEFORE BREAKFAST 30 tablet 5   fluticasone  (FLONASE ) 50 MCG/ACT nasal spray Place 2 sprays into both nostrils daily. 16 g 2   furosemide  (LASIX ) 80 MG tablet Take 1 tablet (80  mg total) by mouth daily. OK TO TAKE EXTRA TABLET DAILY AS NEEDED FOR FLUID RETENTION(EDEMA) 110 tablet 3   Hypromellose (ARTIFICIAL TEARS OP) Place 1 drop into both eyes daily as needed (dry eyes).     levothyroxine  (SYNTHROID ) 75 MCG tablet Take 1 tablet (75 mcg total) by mouth daily before breakfast. 90 tablet 2   loperamide (IMODIUM) 2 MG capsule Take 2 mg by mouth as needed for diarrhea or loose stools.     neomycin -polymyxin b-dexamethasone  (MAXITROL) 3.5-10000-0.1 OINT 2 (two) times daily.     neomycin -polymyxin-dexamethasone  (MAXITROL) 0.1 % ophthalmic suspension Place 1 drop into the right eye 4 (four) times daily.     olmesartan (BENICAR) 5 MG tablet Take 5 mg by mouth daily.     OZEMPIC , 0.25 OR 0.5 MG/DOSE, 2 MG/3ML SOPN INJECT 0.5 MG UNDER THE SKIN ONCE WEEKLY 3 mL 2   potassium chloride  20 MEQ/15ML (10%) SOLN TAKE 15 ML BY MOUTH TWICE DAILY (Patient taking differently: Take 15 mLs by mouth daily.) 946 mL 1   temazepam  (RESTORIL ) 15 MG capsule Take 1 capsule (15 mg total) by mouth at bedtime as needed for sleep. 30 capsule 0   vitamin B-12 (CYANOCOBALAMIN) 100 MCG tablet Take 100 mcg by mouth daily.     vitamin C (ASCORBIC ACID) 500 MG tablet Take 500 mg by mouth daily.  No current facility-administered medications for this visit.     Past Medical History:  Diagnosis Date   Acute blood loss anemia    Acute encephalopathy    Acute hypoxemic respiratory failure (HCC)    Acute idiopathic gout of right ankle    Acute lumbar back pain    Acute on chronic systolic heart failure (HCC)    Acute respiratory failure (HCC)    AKI (acute kidney injury)    Altered mental status    Aortic aneurysm without rupture 02/09/2016   Aortic root enlargement 02/13/2012   Aortic valve regurgitation, acquired 02/13/2012   Arrhythmia    Atrial flutter (HCC) 03/18/2012   Back pain    Cardiac arrest (HCC) 02/01/2016   CHF (congestive heart failure) (HCC)    Chronic anticoagulation 03/04/2013    Chronic combined systolic and diastolic heart failure (HCC)    Chronic kidney disease    kidney fx studies increased    Chronic lower back pain    Chronic renal insufficiency    Chronic systolic heart failure (HCC) 02/13/2012   Recent diagnosis 4 / 2013, LVEF 25% by Echo 12/2011  03/2013: Echo at [facility] Conclusions: 1. Left ventricular ejection fraction estimated by 2D at 40-45 percent. 2. Mild concentric left ventricular hypertrophy. 3. Mild left atrial enlargement. 4. Moderate aortic valve regurgitation. 5. The aortic root at the sinus(es) of valsalva is moderately dilated 6. Mild mitral valve regurgitation. 7.   CKD (chronic kidney disease)    CKD (chronic kidney disease), stage IV (HCC) 08/06/2013   Creatinine 2.4 on 07/04/13    Claustrophobia    Colon cancer screening 03/04/2013   Debility 02/22/2016   Diabetes mellitus type 2 in nonobese [facility])    Diabetes mellitus type 2 in obese    Dysrhythmia    palpitations   Encounter for central line placement    Exertional dyspnea 01/2012   Femoral nerve injury 02/22/2016   Femoral neuropathy    Goals of care, counseling/discussion 05/05/2020   HCAP (healthcare-associated pneumonia)    Heart murmur    Hyperlipidemia 02/13/2012   Hypertension    Hypothyroidism    Internal hemorrhoids without mention of complication 04/11/2013   Labile blood pressure    Left bundle branch block 02/13/2012   Leg weakness, bilateral    Long term (current) use of anticoagulants 08/06/2013   Eliquis  therapy    Long term current use of amiodarone  08/07/2016   Lower extremity weakness    Migraine 02/13/2012   opthalmic   Multiple myeloma (HCC) 05/05/2020   Non-traumatic rhabdomyolysis    Obesity (BMI 30-39.9) 02/13/2012   Pain    Paroxysmal atrial fibrillation (HCC)    Pneumonia    Retroperitoneal bleed    Right ankle pain    Right knee pain    Severe aortic regurgitation 02/09/2016   Special screening for malignant neoplasms, colon  04/11/2013   Thyroid  activity decreased    Tibial pain    Varicose vein of leg    right   Ventricular fibrillation (HCC) 02/09/2016   Weakness of both lower extremities     ROS:   All systems reviewed and negative except as noted in the HPI.   Past Surgical History:  Procedure Laterality Date   AORTIC VALVE REPLACEMENT  11/20/2018   CARDIAC CATHETERIZATION N/A 02/17/2016   Procedure: Left Heart Cath and Coronary Angiography;  Surgeon: Candyce GORMAN Reek, MD;  Location: [facility] INVASIVE CV LAB;  Service: Cardiovascular;  Laterality: N/A;   CARDIOVERSION  03/22/2012  Procedure: CARDIOVERSION;  Surgeon: Oneil Parchment, MD;  Location: [facility] ENDOSCOPY;  Service: Cardiovascular;  Laterality: N/A;   CARDIOVERSION  04/19/2012   Procedure: CARDIOVERSION;  Surgeon: Victory LELON Claudene DOUGLAS, MD;  Location: [facility] OR;  Service: Cardiovascular;  Laterality: N/A;   COLONOSCOPY N/A 04/11/2013   Procedure: COLONOSCOPY;  Surgeon: Lamar JONETTA Aho, MD;  Location: [facility] ENDOSCOPY;  Service: Endoscopy;  Laterality: N/A;   COLONOSCOPY N/A 04/11/2013   Procedure: COLONOSCOPY;  Surgeon: Lamar JONETTA Aho, MD;  Location: [facility] ENDOSCOPY;  Service: Endoscopy;  Laterality: N/A;   EP IMPLANTABLE DEVICE N/A 02/17/2016   Procedure: BiV ICD Insertion CRT-D;  Surgeon: Danelle LELON Birmingham, MD;  Location: [facility] INVASIVE CV LAB;  Service: Cardiovascular;  Laterality: N/A;   FINGER SURGERY  2012   4th digit right hand; thumb on left hand   RADIOLOGY WITH ANESTHESIA N/A 02/11/2016   Procedure: MRI OF THE BRAIN WITHOUT CONTRAST, LUMBAR WITHOUT CONTRAST;  Surgeon: Medication Radiologist, MD;  Location: [facility] OR;  Service: Radiology;  Laterality: N/A;  DR. WOOD/MRI   RIGHT/LEFT HEART CATH AND CORONARY ANGIOGRAPHY N/A 07/26/2018   Procedure: RIGHT/LEFT HEART CATH AND CORONARY ANGIOGRAPHY;  Surgeon: Claudene Victory LELON, MD;  Location: [facility] INVASIVE CV LAB;  Service: Cardiovascular;  Laterality: N/A;   Skin melanocytoma excision  2012   above left clavicle   STERNAL  INCISION RECLOSURE  11/2018   STERNAL WIRE REMOVAL  11/2018   STERNAL WOUND DEBRIDEMENT  11/2018   TEE WITHOUT CARDIOVERSION  03/22/2012   Procedure: TRANSESOPHAGEAL ECHOCARDIOGRAM (TEE);  Surgeon: Oneil Parchment, MD;  Location: [facility] ENDOSCOPY;  Service: Cardiovascular;  Laterality: N/A;   TEE WITHOUT CARDIOVERSION N/A 02/08/2016   Procedure: TRANSESOPHAGEAL ECHOCARDIOGRAM (TEE);  Surgeon: Redell GORMAN Shallow, MD;  Location: [facility] ENDOSCOPY;  Service: Cardiovascular;  Laterality: N/A;     Family History  Problem Relation Age of Onset   Hypertension Mother    Heart disease Mother    Heart failure Mother    Diabetes Mother    Hypertension Father    Heart disease Father    Heart failure Father      Social History   Socioeconomic History   Marital status: Married    Spouse name: [name]   Number of children: 1   Years of education: Not on file   Highest education level: Not on file  Occupational History    Employer: [employer]  Tobacco Use   Smoking status: Never    Passive exposure: Past   Smokeless tobacco: Never  Vaping Use   Vaping status: Never Used  Substance and Sexual Activity   Alcohol use: Not Currently    Comment: rarely   Drug use: No   Sexual activity: Not Currently  Other Topics Concern   Not on file  Social History Narrative   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   Social Drivers of Corporate Investment Banker Strain: Low Risk  (02/20/2024)   Overall Financial Resource Strain (CARDIA)    Difficulty of Paying Living Expenses: Not hard at all  Food Insecurity: No Food Insecurity (08/01/2024)   Hunger Vital Sign    Worried About Running Out of Food in the Last Year: Never true    Ran Out of Food in the Last Year: Never true  Transportation Needs: No Transportation Needs (02/20/2024)   PRAPARE - Administrator, Civil Service (Medical): No    Lack of Transportation (Non-Medical): No  Physical Activity:  Insufficiently Active (02/20/2024)   Exercise Vital Sign    Days of Exercise per Week: 2 days    Minutes of Exercise per Session: 30 min  Stress: Stress Concern Present (02/20/2024)   [employer] of Occupational Health - Occupational Stress Questionnaire    Feeling of Stress : To some extent  Social Connections: Moderately Integrated (02/20/2024)   Social Connection and Isolation Panel    Frequency of Communication with Friends and Family: More than three times a week    Frequency of Social Gatherings with Friends and Family: Once a week    Attends Religious Services: 1 to 4 times per year    Active Member of [employer] or Organizations: Yes    Attends Banker Meetings: More than 4 times per year    Marital Status: Widowed  Intimate Partner Violence: Not At Risk (02/20/2024)   Humiliation, Afraid, Rape, and Kick questionnaire    Fear of Current or Ex-Partner: No    Emotionally Abused: No    Physically Abused: No    Sexually Abused: No     BP 122/88   Pulse 68   Ht 5' 9 (1.753 m)   Wt 201 lb 14.4 oz (91.6 kg)   SpO2 98%   BMI 29.82 kg/m   Physical Exam:  Well appearing NAD HEENT: Unremarkable Neck:  No JVD, no thyromegally Lymphatics:  No adenopathy Back:  No CVA tenderness Lungs:  Clear with no wheezes HEART:  Regular rate rhythm, with a split S2.  Abd:  soft, positive bowel sounds, no organomegally, no rebound, no guarding Ext:  2 plus pulses, no edema, no cyanosis, no clubbing Skin:  No rashes no nodules Neuro:  CN II through XII intact, motor grossly intact   DEVICE  Normal device function.  See PaceArt for details. ERI  Assess/Plan:  1. Atrial fib - his rate is well controlled. No change in meds. 2. VT - he will continue low dose amiodarone .  3. ICD - his St. Jude biv ICD is working normally. He is at [facility] and will undergo gen change out.  4. Chronic systolic heart failure - his symptoms remain class 2. He is encouraged to maintain a low sodium  diet and continue his diuretic.   Danelle Rudell Marlowe,MD

## 2024-08-07 ENCOUNTER — Encounter: Payer: Self-pay | Admitting: Hematology & Oncology

## 2024-08-07 LAB — BASIC METABOLIC PANEL WITH GFR
BUN/Creatinine Ratio: 14 (ref 10–24)
BUN: 31 mg/dL — ABNORMAL HIGH (ref 8–27)
CO2: 21 mmol/L (ref 20–29)
Calcium: 8.7 mg/dL (ref 8.6–10.2)
Chloride: 105 mmol/L (ref 96–106)
Creatinine, Ser: 2.21 mg/dL — ABNORMAL HIGH (ref 0.76–1.27)
Glucose: 90 mg/dL (ref 70–99)
Potassium: 3.8 mmol/L (ref 3.5–5.2)
Sodium: 140 mmol/L (ref 134–144)
eGFR: 31 mL/min/1.73 — ABNORMAL LOW (ref >59)

## 2024-08-07 LAB — CBC
Hematocrit: 35.4 % — ABNORMAL LOW (ref 37.5–51.0)
Hemoglobin: 10.9 g/dL — ABNORMAL LOW (ref 13.0–17.7)
MCH: 30.4 pg (ref 26.6–33.0)
MCHC: 30.8 g/dL — ABNORMAL LOW (ref 31.5–35.7)
MCV: 99 fL — ABNORMAL HIGH (ref 79–97)
Platelets: 168 x10E3/uL (ref 150–450)
RBC: 3.58 x10E6/uL — ABNORMAL LOW (ref 4.14–5.80)
RDW: 15.6 % — ABNORMAL HIGH (ref 11.6–15.4)
WBC: 4.8 x10E3/uL (ref 3.4–10.8)

## 2024-08-08 ENCOUNTER — Encounter: Payer: Self-pay | Admitting: Physical Therapy

## 2024-08-08 ENCOUNTER — Ambulatory Visit: Admitting: Physical Therapy

## 2024-08-08 DIAGNOSIS — R2681 Unsteadiness on feet: Secondary | ICD-10-CM

## 2024-08-08 DIAGNOSIS — M6281 Muscle weakness (generalized): Secondary | ICD-10-CM | POA: Diagnosis not present

## 2024-08-08 DIAGNOSIS — R2689 Other abnormalities of gait and mobility: Secondary | ICD-10-CM | POA: Diagnosis not present

## 2024-08-08 NOTE — Therapy (Signed)
 OUTPATIENT PHYSICAL THERAPY TREATMENT   Patient Name: Eric Aust., MD MRN: [id number] DOB:[birth year], 73 y.o., male Today's Date: 08/08/2024   PCP:  Catheryn Slocumb, MD  REFERRING PROVIDER: Catheryn Slocumb, MD   END OF SESSION:  PT End of Session - 08/08/24 1319     Visit Number 7    Number of Visits 17    Date for Recertification  08/18/24    Authorization Type none    PT Start Time 1317   pt 17 min late   PT Stop Time 1349    PT Time Calculation (min) 32 min    Activity Tolerance Patient tolerated treatment well    Behavior During Therapy [facility] for tasks assessed/performed                Past Medical History:  Diagnosis Date   Acute blood loss anemia    Acute encephalopathy    Acute hypoxemic respiratory failure (HCC)    Acute idiopathic gout of right ankle    Acute lumbar back pain    Acute on chronic systolic heart failure (HCC)    Acute respiratory failure (HCC)    AKI (acute kidney injury)    Altered mental status    Aortic aneurysm without rupture 02/09/2016   Aortic root enlargement 02/13/2012   Aortic valve regurgitation, acquired 02/13/2012   Arrhythmia    Atrial flutter (HCC) 03/18/2012   Back pain    Cardiac arrest (HCC) 02/01/2016   CHF (congestive heart failure) (HCC)    Chronic anticoagulation 03/04/2013   Chronic combined systolic and diastolic heart failure (HCC)    Chronic kidney disease    kidney fx studies increased    Chronic lower back pain    Chronic renal insufficiency    Chronic systolic heart failure (HCC) 02/13/2012   Recent diagnosis 4 / 2013, LVEF 25% by Echo 12/2011  03/2013: Echo at [facility] Conclusions: 1. Left ventricular ejection fraction estimated by 2D at 40-45 percent. 2. Mild concentric left ventricular hypertrophy. 3. Mild left atrial enlargement. 4. Moderate aortic valve regurgitation. 5. The aortic root at the sinus(es) of valsalva is moderately dilated 6. Mild mitral valve regurgitation. 7.   CKD (chronic  kidney disease)    CKD (chronic kidney disease), stage IV (HCC) 08/06/2013   Creatinine 2.4 on 07/04/13    Claustrophobia    Colon cancer screening 03/04/2013   Debility 02/22/2016   Diabetes mellitus type 2 in nonobese [facility])    Diabetes mellitus type 2 in obese    Dysrhythmia    palpitations   Encounter for central line placement    Exertional dyspnea 01/2012   Femoral nerve injury 02/22/2016   Femoral neuropathy    Goals of care, counseling/discussion 05/05/2020   HCAP (healthcare-associated pneumonia)    Heart murmur    Hyperlipidemia 02/13/2012   Hypertension    Hypothyroidism    Internal hemorrhoids without mention of complication 04/11/2013   Labile blood pressure    Left bundle branch block 02/13/2012   Leg weakness, bilateral    Long term (current) use of anticoagulants 08/06/2013   Eliquis  therapy    Long term current use of amiodarone  08/07/2016   Lower extremity weakness    Migraine 02/13/2012   opthalmic   Multiple myeloma (HCC) 05/05/2020   Non-traumatic rhabdomyolysis    Obesity (BMI 30-39.9) 02/13/2012   Pain    Paroxysmal atrial fibrillation (HCC)    Pneumonia    Retroperitoneal bleed    Right ankle pain  Right knee pain    Severe aortic regurgitation 02/09/2016   Special screening for malignant neoplasms, colon 04/11/2013   Thyroid  activity decreased    Tibial pain    Varicose vein of leg    right   Ventricular fibrillation (HCC) 02/09/2016   Weakness of both lower extremities    Past Surgical History:  Procedure Laterality Date   AORTIC VALVE REPLACEMENT  11/20/2018   CARDIAC CATHETERIZATION N/A 02/17/2016   Procedure: Left Heart Cath and Coronary Angiography;  Surgeon: Candyce GORMAN Reek, MD;  Location: [facility] INVASIVE CV LAB;  Service: Cardiovascular;  Laterality: N/A;   CARDIOVERSION  03/22/2012   Procedure: CARDIOVERSION;  Surgeon: Oneil Parchment, MD;  Location: [facility] ENDOSCOPY;  Service: Cardiovascular;  Laterality: N/A;   CARDIOVERSION   04/19/2012   Procedure: CARDIOVERSION;  Surgeon: Victory LELON Claudene DOUGLAS, MD;  Location: [facility] OR;  Service: Cardiovascular;  Laterality: N/A;   COLONOSCOPY N/A 04/11/2013   Procedure: COLONOSCOPY;  Surgeon: Lamar JONETTA Aho, MD;  Location: [facility] ENDOSCOPY;  Service: Endoscopy;  Laterality: N/A;   COLONOSCOPY N/A 04/11/2013   Procedure: COLONOSCOPY;  Surgeon: Lamar JONETTA Aho, MD;  Location: [facility] ENDOSCOPY;  Service: Endoscopy;  Laterality: N/A;   EP IMPLANTABLE DEVICE N/A 02/17/2016   Procedure: BiV ICD Insertion CRT-D;  Surgeon: Danelle LELON Birmingham, MD;  Location: [facility] INVASIVE CV LAB;  Service: Cardiovascular;  Laterality: N/A;   FINGER SURGERY  2012   4th digit right hand; thumb on left hand   RADIOLOGY WITH ANESTHESIA N/A 02/11/2016   Procedure: MRI OF THE BRAIN WITHOUT CONTRAST, LUMBAR WITHOUT CONTRAST;  Surgeon: Medication Radiologist, MD;  Location: [facility] OR;  Service: Radiology;  Laterality: N/A;  DR. WOOD/MRI   RIGHT/LEFT HEART CATH AND CORONARY ANGIOGRAPHY N/A 07/26/2018   Procedure: RIGHT/LEFT HEART CATH AND CORONARY ANGIOGRAPHY;  Surgeon: Claudene Victory LELON, MD;  Location: [facility] INVASIVE CV LAB;  Service: Cardiovascular;  Laterality: N/A;   Skin melanocytoma excision  2012   above left clavicle   STERNAL INCISION RECLOSURE  11/2018   STERNAL WIRE REMOVAL  11/2018   STERNAL WOUND DEBRIDEMENT  11/2018   TEE WITHOUT CARDIOVERSION  03/22/2012   Procedure: TRANSESOPHAGEAL ECHOCARDIOGRAM (TEE);  Surgeon: Oneil Parchment, MD;  Location: [facility] ENDOSCOPY;  Service: Cardiovascular;  Laterality: N/A;   TEE WITHOUT CARDIOVERSION N/A 02/08/2016   Procedure: TRANSESOPHAGEAL ECHOCARDIOGRAM (TEE);  Surgeon: Redell GORMAN Shallow, MD;  Location: [facility] ENDOSCOPY;  Service: Cardiovascular;  Laterality: N/A;   Patient Active Problem List   Diagnosis Date Noted   Umbilical hernia without obstruction and without gangrene 05/11/2024   Chalazion of right upper eyelid 05/11/2024   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  05/11/2024   Venous stasis 05/11/2024   Adjustment insomnia 01/01/2024   Presence of heart assist device (HCC) 12/31/2023   Enlarged prostate 09/10/2023   Dyslipidemia due to type 2 diabetes mellitus (HCC) 09/10/2023   Acquired thrombophilia 05/14/2023   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   Goals of care, counseling/discussion 05/05/2020   Multiple myeloma (HCC) 05/05/2020   S/P flap graft 01/07/2019   Coronary artery disease 01/07/2019   Long term (current) use of antibiotics 01/03/2019   Essential hypertension 12/27/2018   Postoperative infection of wound of sternum 12/27/2018   Decreased strength, endurance, and mobility 12/27/2018   Cardiac LV ejection fraction 30-35% 12/27/2018   Decreased activities of daily living (ADL) 12/27/2018   Klebsiella infection 12/27/2018   S/P AVR (aortic valve replacement) 11/23/2018   S/P ascending aortic aneurysm repair  11/23/2018   NICM (nonischemic cardiomyopathy) (HCC) 11/19/2018   AICD (automatic cardioverter/defibrillator) present 11/19/2018   Other symptoms and signs involving the musculoskeletal system 05/08/2018   Long term current use of amiodarone  08/07/2016   Type 2 diabetes mellitus with stage 3b chronic kidney disease, without long-term current use of insulin  (HCC)    Femoral neuropathy    Leg weakness, bilateral    PAF (paroxysmal atrial fibrillation) (HCC)    Thyroid  activity decreased    Arrhythmia    Long term current use of anticoagulant 03/04/2013   Atrial flutter (HCC) 03/18/2012    Class: Acute   Chronic combined systolic and diastolic CHF (congestive heart failure) (HCC) 02/13/2012    Class: Acute   Hypertension, accelerated 02/13/2012   Hyperlipidemia 02/13/2012   Left bundle branch block 02/13/2012   Obesity (BMI 30-39.9) 02/13/2012    ONSET DATE:   REFERRING DIAG:  Diagnosis R53.1,Z74.09,R68.89 (ICD-10-CM) - Decreased strength, endurance, and mobility   THERAPY DIAG:  Unsteadiness on  feet  Muscle weakness (generalized)  Other abnormalities of gait and mobility  Rationale for Evaluation and Treatment: Rehabilitation  SUBJECTIVE:                                                                                                                                                                                             SUBJECTIVE STATEMENT: Sorry I'm late. Last pool was good but I did feel tight after in my muscles.   Eval: I have peripheral neuropathy in the Rt leg.  I am also noticing some weakness in both of the arms maybe from taking the ozempic .  I may want to do more aquatic therapy.  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  I have tingling on the inner groin, knee and front of the leg.  Nothing really in the foot.   I am playing 9 holes of golf lately.  I walk at home.  I go back to the [employer] to ride the bike.    PERTINENT HISTORY: , atrial fibrillation, cardiac arrest s/p ICD, CAD, severe aortic regurgitation s/p AVR 10/2018 and aortic root replacement with CABGx1, aortic dilation, hypertension, hypothyroidism, type 2 diabetes, multiple myeloma, CKD IIIb-IV, migraine  PAIN:  Are you having pain? Yes: NPRS scale: Yes - on left knee and occasionally in lumbar spine 5-6/10  PRECAUTIONS: Fall and ICD/Pacemaker  RED FLAGS: None   WEIGHT BEARING RESTRICTIONS: No  FALLS: Has patient fallen in last 6 months? No  LIVING ENVIRONMENT: Lives with: alone  Lives in: House/apartment Stairs: 4 steps to enter; 3 story home Has following equipment  at home: Shower bench, and raised commode  PLOF: Independent;  working part time as Ophthalmologist- requires sitting, standing, walking   PATIENT GOALS: improve R LE strength and bil UE strength   OBJECTIVE:  Note: Objective measures were completed at Evaluation unless otherwise noted.  DIAGNOSTIC FINDINGS: none recent  COGNITION: Overall cognitive status: Within functional  limits for tasks assessed   SENSATION: Intact in B LEs to light touch   POSTURE: rounded shoulders and forward head  LOWER EXTREMITY ROM:     Active  Right Eval Left Eval  Hip flexion    Hip extension    Hip abduction    Hip adduction    Hip internal rotation    Hip external rotation    Knee flexion    Knee extension    Ankle dorsiflexion    Ankle plantarflexion    Ankle inversion    Ankle eversion     (Blank rows = not tested)  LOWER EXTREMITY MMT:    MMT Right Eval Left Eval  Hip flexion 3+ 4  Hip extension    Hip abduction 4+ 4+  Hip adduction 4 4  Hip internal rotation 4+ 4+  Hip external rotation 4- 4  Knee flexion 5 5  Knee extension 4- 4+  Ankle dorsiflexion 5 5  Ankle plantarflexion 4+ 4+  Ankle inversion    Ankle eversion    (Blank rows = not tested)  UPPER EXTREMITY MMT:  MMT Right eval Left eval  Shoulder flexion 4 4  Shoulder extension    Shoulder abduction 4 4  Shoulder adduction    Shoulder extension    Shoulder internal rotation 5 5  Shoulder external rotation 5 5  Middle trapezius    Lower trapezius    Elbow flexion 4+ 5  Elbow extension 4+ 4+  Wrist flexion    Wrist extension    Wrist ulnar deviation    Wrist radial deviation    Wrist pronation    Wrist supination    Grip strength 58 40   (Blank rows = not tested)  GAIT: Gait pattern: Significant R-ward trunk lean with gait; slightly slowed gait speed  Assistive device utilized: None Level of assistance: Modified independence  FUNCTIONAL TESTS:  5x sit to stand:   walk: able to walk 556ft stopping due to the Rt leg feeling tight.  SOB level 4/10  06/25/24 SL stance:  Rt: unable to perform without UE support; with  single arm support able to hold for 20 Lt: 3 seconds without UE support; 20 with single arm UE support   Tandem stance:  Rt: 20 with single arm UE support  Lt: 30 without UE support    TODAY'S TREATMENT:    08/08/24:Pt arrives for aquatic  physical therapy. Treatment took place in 3.5-5.5 feet of water. Water temperature was 91 degrees F. Pt entered the pool via stairs independently. Seated water bench with 75% submersion Pt performed seated LE AROM exercises 20x in all planes, review of previous aquatic exercises, it has been > 6 mos so pt could not remember much -75% water depth for walking 3 ways with rainbow UE floats for push/pull: 6 lengths of each -Hip 3 ways 15x Bil holding onto the wall. Vc for control -high knee marching holding rainbow floats 4 lengths -Side steps holding 1 rail 10x Bil -Horseback bicycle on yellow noodle in the corner 3 min   08/04/24 NuStep UE 12/ LE 8 level 3 for 6 min - PT present to discuss  progress  Standing hip 3-way (flex, ext, abd) with 2 # AW - 2x10 for flex & abd, 1x10 for ext due to increase lumbar pain - VC for proper technique and  UE support in // bars  Sidestepping in // bars x 2 laps  LAQs 2# ankle weights 2 x 10 bilateral Tandem balance in // bars 2 x 30 each Manual Therapy: STM to Lt paraspinals, Lt glut and Lt oblique with pt S/L  07/30/24 NuStep UE 12/ LE 8 level 3 for 6 min-PT present to discuss progress- stopped arms at 4 min due to arm pain Sit to Stand from chair x 8 with UE support with the last 3 reps Standing hip 3-way (flex, ext, abd) 2x10 each with VC for proper technique & rest breaks between exercises with UE support in // bars - added 2#  Sidestepping in // bars x 2 laps  Step taps 6 step x 10 bilaterally with UE support in // bars  LAQs 2# ankle weights 2 x 10 bilateral Seated adduction ball squeezes 3 hold 2x10 with VC to not hold breath when squeezing ball Tandem balance in // bars 2 x 30 each 6/10 RPE post session   PATIENT EDUCATION: Education details: per today's note Person educated: Patient Education method: Explanation, Demonstration, Tactile cues, Verbal cues, and Handouts Education comprehension: verbalized understanding and returned  demonstration  HOME EXERCISE PROGRAM: Access Code: [email or web address] URL: [email or web address] Date: 06/23/2024 Prepared by: Saddie Raw  Exercises - Standing Hip Abduction with Counter Support  - 1 x daily - 5 x weekly - 2 sets - 10 reps - Side Stepping with Resistance at Ankles and Counter Support  - 1 x daily - 5 x weekly - 1 sets - 3-5 reps - Standing Hip Extension with Counter Support  - 1 x daily - 5 x weekly - 1-3 sets - 10 reps - 20-30 seconds hold - Seated Long Arc Quad with Ankle Weight  - 1 x daily - 5 x weekly - 2 sets - 10 reps - Sit to Stand  - 1 x daily - 5 x weekly - 2 sets - 10 reps - Seated Hamstring Stretch  - 1 x daily - 7 x weekly - 1-3 sets - 3 reps - 20-30 seconds hold  GOALS: Goals reviewed with patient? Yes  SHORT TERM GOALS: Target date: 06/23/24/2024  Patient to be independent with initial HEP. Baseline: HEP initiated Goal status: MET from previous PT sessions    LONG TERM GOALS: Target date: 08/18/24/2024  Patient to be independent with advanced HEP. Baseline: Not yet initiated  Goal status: INITIAL  Patient to demonstrate Rt LE strength >/=4/5.  Baseline: See above Goal status: INITIAL  -    Pt will improve his walk test to being able to walk the full without rest and reaching at least 1068ft  Baseline: 565ft     ASSESSMENT:  CLINICAL IMPRESSION: Pt arrives to aquatic PT almost 20 min late. We were able to complete his original exercises, taught previous week, but no time for new exercises today We were able to reduce amount of floatation required.. Still some noted fatigue with the current level of work, pt reports it is not easy but ok.  OBJECTIVE IMPAIRMENTS: Abnormal gait, decreased balance, difficulty walking, decreased ROM, decreased strength, impaired flexibility, impaired sensation, postural dysfunction, and pain.   ACTIVITY LIMITATIONS: carrying, lifting, bending, standing, squatting, stairs, transfers, bathing,  toileting, dressing, locomotion level, and caring for others  PARTICIPATION LIMITATIONS: meal  prep, cleaning, laundry, shopping, community activity, occupation, yard work, and church  PERSONAL FACTORS: Age, Past/current experiences, Time since onset of injury/illness/exacerbation, and 3+ comorbidities: HFrEF, atrial fibrillation, VF cardiac arrest s/p ICD, CAD, severe aortic regurgitation s/p AVR 10/2018 and aortic root replacement with CABGx1, aortic dilation, hypertension, hypothyroidism, type 2 diabetes, multiple myeloma, CKD IIIb-IV, migraine are also affecting patient's functional outcome.   REHAB POTENTIAL: Good  CLINICAL DECISION MAKING: LOW  EVALUATION COMPLEXITY: LOW  PLAN:  PT FREQUENCY: 2x/week  PT DURATION: 8 weeks   PLANNED INTERVENTIONS: [id number]- PT Re-evaluation, 97110-Therapeutic exercises, 97530- Therapeutic activity, 97112- Neuromuscular re-education, 97535- Self Care, [id number]- Manual therapy, [phone]- Gait training, [phone]- Canalith repositioning, [phone]- Aquatic Therapy, Patient/Family education, Balance training, Stair training, Taping, Dry Needling, Joint mobilization, Spinal mobilization, Vestibular training, DME instructions, Cryotherapy, and Moist heat  PLAN FOR NEXT SESSION: general strength like nustep, Rt LE general but hip focus, and general bil UE (no sig weakness here), 6 min walk test  Delon Darner, PTA 08/08/24 1:49 PM

## 2024-08-11 ENCOUNTER — Encounter: Payer: Self-pay | Admitting: Rehabilitation

## 2024-08-11 ENCOUNTER — Ambulatory Visit: Admitting: Rehabilitation

## 2024-08-11 ENCOUNTER — Telehealth: Payer: Self-pay | Admitting: Gastroenterology

## 2024-08-11 ENCOUNTER — Telehealth (HOSPITAL_COMMUNITY): Payer: Self-pay | Admitting: *Deleted

## 2024-08-11 ENCOUNTER — Encounter (HOSPITAL_COMMUNITY): Payer: Self-pay

## 2024-08-11 ENCOUNTER — Ambulatory Visit

## 2024-08-11 DIAGNOSIS — R2681 Unsteadiness on feet: Secondary | ICD-10-CM

## 2024-08-11 DIAGNOSIS — M6281 Muscle weakness (generalized): Secondary | ICD-10-CM | POA: Diagnosis not present

## 2024-08-11 DIAGNOSIS — R2689 Other abnormalities of gait and mobility: Secondary | ICD-10-CM

## 2024-08-11 NOTE — Telephone Encounter (Signed)
 Gaither Cyrus Raddle., MD was scheduled for Colonoscopy (Procedure) with Dr. Charlanne on 08/18/2024 (date), at [facility].   Patient/or family called on 11/17 (date) to cancel their procedure due to not having pacemaker appointment (reason)  Dr Charlanne & office notified. Patient instructed to call physician's office to reschedule their procedure. Patient demonstrated understanding.

## 2024-08-11 NOTE — Therapy (Signed)
 OUTPATIENT PHYSICAL THERAPY TREATMENT   Patient Name: Eric [name]., MD MRN: [id number] DOB:08-Sep-1951, 73 y.o., male Today's Date: 08/11/2024   PCP:  Catheryn Slocumb, MD  REFERRING PROVIDER: Catheryn Slocumb, MD   END OF SESSION:  PT End of Session - 08/11/24 0917     Visit Number 8    Number of Visits 17    Date for Recertification  09/08/24    Authorization Type none    PT Start Time 0915    PT Stop Time 1000    PT Time Calculation (min) 45 min    Activity Tolerance Patient tolerated treatment well    Behavior During Therapy [facility] for tasks assessed/performed                 Past Medical History:  Diagnosis Date   Acute blood loss anemia    Acute encephalopathy    Acute hypoxemic respiratory failure (HCC)    Acute idiopathic gout of right ankle    Acute lumbar back pain    Acute on chronic systolic heart failure (HCC)    Acute respiratory failure (HCC)    AKI (acute kidney injury)    Altered mental status    Aortic aneurysm without rupture 02/09/2016   Aortic root enlargement 02/13/2012   Aortic valve regurgitation, acquired 02/13/2012   Arrhythmia    Atrial flutter (HCC) 03/18/2012   Back pain    Cardiac arrest (HCC) 02/01/2016   CHF (congestive heart failure) (HCC)    Chronic anticoagulation 03/04/2013   Chronic combined systolic and diastolic heart failure (HCC)    Chronic kidney disease    kidney fx studies increased    Chronic lower back pain    Chronic renal insufficiency    Chronic systolic heart failure (HCC) 02/13/2012   Recent diagnosis 4 / 2013, LVEF 25% by Echo 12/2011  03/2013: Echo at [facility] Conclusions: 1. Left ventricular ejection fraction estimated by 2D at 40-45 percent. 2. Mild concentric left ventricular hypertrophy. 3. Mild left atrial enlargement. 4. Moderate aortic valve regurgitation. 5. The aortic root at the sinus(es) of valsalva is moderately dilated 6. Mild mitral valve regurgitation. 7.   CKD (chronic kidney disease)     CKD (chronic kidney disease), stage IV (HCC) 08/06/2013   Creatinine 2.4 on 07/04/13    Claustrophobia    Colon cancer screening 03/04/2013   Debility 02/22/2016   Diabetes mellitus type 2 in nonobese [facility])    Diabetes mellitus type 2 in obese    Dysrhythmia    palpitations   Encounter for central line placement    Exertional dyspnea 01/2012   Femoral nerve injury 02/22/2016   Femoral neuropathy    Goals of care, counseling/discussion 05/05/2020   HCAP (healthcare-associated pneumonia)    Heart murmur    Hyperlipidemia 02/13/2012   Hypertension    Hypothyroidism    Internal hemorrhoids without mention of complication 04/11/2013   Labile blood pressure    Left bundle branch block 02/13/2012   Leg weakness, bilateral    Long term (current) use of anticoagulants 08/06/2013   Eliquis  therapy    Long term current use of amiodarone  08/07/2016   Lower extremity weakness    Migraine 02/13/2012   opthalmic   Multiple myeloma (HCC) 05/05/2020   Non-traumatic rhabdomyolysis    Obesity (BMI 30-39.9) 02/13/2012   Pain    Paroxysmal atrial fibrillation (HCC)    Pneumonia    Retroperitoneal bleed    Right ankle pain    Right knee pain  Severe aortic regurgitation 02/09/2016   Special screening for malignant neoplasms, colon 04/11/2013   Thyroid  activity decreased    Tibial pain    Varicose vein of leg    right   Ventricular fibrillation (HCC) 02/09/2016   Weakness of both lower extremities    Past Surgical History:  Procedure Laterality Date   AORTIC VALVE REPLACEMENT  11/20/2018   CARDIAC CATHETERIZATION N/A 02/17/2016   Procedure: Left Heart Cath and Coronary Angiography;  Surgeon: Candyce GORMAN Reek, MD;  Location: [facility] INVASIVE CV LAB;  Service: Cardiovascular;  Laterality: N/A;   CARDIOVERSION  03/22/2012   Procedure: CARDIOVERSION;  Surgeon: Oneil Parchment, MD;  Location: [facility] ENDOSCOPY;  Service: Cardiovascular;  Laterality: N/A;   CARDIOVERSION  04/19/2012    Procedure: CARDIOVERSION;  Surgeon: Victory LELON Claudene DOUGLAS, MD;  Location: [facility] OR;  Service: Cardiovascular;  Laterality: N/A;   COLONOSCOPY N/A 04/11/2013   Procedure: COLONOSCOPY;  Surgeon: Lamar JONETTA Aho, MD;  Location: [facility] ENDOSCOPY;  Service: Endoscopy;  Laterality: N/A;   COLONOSCOPY N/A 04/11/2013   Procedure: COLONOSCOPY;  Surgeon: Lamar JONETTA Aho, MD;  Location: [facility] ENDOSCOPY;  Service: Endoscopy;  Laterality: N/A;   EP IMPLANTABLE DEVICE N/A 02/17/2016   Procedure: BiV ICD Insertion CRT-D;  Surgeon: Danelle LELON Birmingham, MD;  Location: [facility] INVASIVE CV LAB;  Service: Cardiovascular;  Laterality: N/A;   FINGER SURGERY  2012   4th digit right hand; thumb on left hand   RADIOLOGY WITH ANESTHESIA N/A 02/11/2016   Procedure: MRI OF THE BRAIN WITHOUT CONTRAST, LUMBAR WITHOUT CONTRAST;  Surgeon: Medication Radiologist, MD;  Location: [facility] OR;  Service: Radiology;  Laterality: N/A;  DR. WOOD/MRI   RIGHT/LEFT HEART CATH AND CORONARY ANGIOGRAPHY N/A 07/26/2018   Procedure: RIGHT/LEFT HEART CATH AND CORONARY ANGIOGRAPHY;  Surgeon: Claudene Victory LELON, MD;  Location: [facility] INVASIVE CV LAB;  Service: Cardiovascular;  Laterality: N/A;   Skin melanocytoma excision  2012   above left clavicle   STERNAL INCISION RECLOSURE  11/2018   STERNAL WIRE REMOVAL  11/2018   STERNAL WOUND DEBRIDEMENT  11/2018   TEE WITHOUT CARDIOVERSION  03/22/2012   Procedure: TRANSESOPHAGEAL ECHOCARDIOGRAM (TEE);  Surgeon: Oneil Parchment, MD;  Location: [facility] ENDOSCOPY;  Service: Cardiovascular;  Laterality: N/A;   TEE WITHOUT CARDIOVERSION N/A 02/08/2016   Procedure: TRANSESOPHAGEAL ECHOCARDIOGRAM (TEE);  Surgeon: Redell GORMAN Shallow, MD;  Location: 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   Goals of care, counseling/discussion 05/05/2020   Multiple myeloma (HCC) 05/05/2020   S/P flap graft 01/07/2019   Coronary artery disease 01/07/2019   Long term (current) use of antibiotics 01/03/2019   Essential hypertension 12/27/2018   Postoperative infection of wound of sternum 12/27/2018   Decreased strength, endurance, and mobility 12/27/2018   Cardiac LV ejection fraction 30-35% 12/27/2018   Decreased activities of daily living (ADL) 12/27/2018   Klebsiella infection 12/27/2018   S/P AVR (aortic valve replacement) 11/23/2018   S/P ascending aortic aneurysm repair 11/23/2018   NICM (nonischemic cardiomyopathy) (  HCC) 11/19/2018   AICD (automatic cardioverter/defibrillator) present 11/19/2018   Other symptoms and signs involving the musculoskeletal system 05/08/2018   Long term current use of amiodarone  08/07/2016   Type 2 diabetes mellitus with stage 3b chronic kidney disease, without long-term current use of insulin  (HCC)    Femoral neuropathy    Leg weakness, bilateral    PAF (paroxysmal atrial fibrillation) (HCC)    Thyroid  activity decreased    Arrhythmia    Long term current use of anticoagulant 03/04/2013   Atrial flutter (HCC) 03/18/2012    Class: Acute   Chronic combined systolic and diastolic CHF (congestive heart failure) (HCC) 02/13/2012    Class: Acute   Hypertension, accelerated 02/13/2012   Hyperlipidemia 02/13/2012   Left bundle branch block 02/13/2012   Obesity (BMI 30-39.9) 02/13/2012    ONSET DATE:   REFERRING DIAG:  Diagnosis R53.1,Z74.09,R68.89 (ICD-10-CM) - Decreased strength, endurance, and mobility   THERAPY DIAG:  Unsteadiness on feet  Muscle weakness  (generalized)  Other abnormalities of gait and mobility  Rationale for Evaluation and Treatment: Rehabilitation  SUBJECTIVE:                                                                                                                                                                                             SUBJECTIVE STATEMENT:  Patient reports he was sore after aquatic therapy and his quads were tight. His back, hip and knee are feeling better since his fall last weekend. He went to the driving range yesterday to hit golf balls. Patient reports he struggles with fatigue when walking from the parking lot into the driving range while carrying his bag. He has had to take most of the stuff out of his bag and only take the necessities when going to the driving range because of this.   Eval: I have peripheral neuropathy in the Rt leg.  I am also noticing some weakness in both of the arms maybe from taking the ozempic .  I may want to do more aquatic therapy.  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  I have tingling on the inner groin, knee and front of the leg.  Nothing really in the foot.   I am playing 9 holes of golf lately.  I walk at home.  I go back to the [employer] to ride the bike.    PERTINENT HISTORY: , atrial fibrillation, cardiac arrest s/p ICD, CAD, severe aortic regurgitation s/p AVR 10/2018 and aortic root replacement with CABGx1, aortic dilation, hypertension, hypothyroidism, type 2 diabetes, multiple myeloma, CKD IIIb-IV, migraine  PAIN:  Are  you having pain? No, just tightness in bilateral quadriceps  PRECAUTIONS: Fall and ICD/Pacemaker  RED FLAGS: None   WEIGHT BEARING RESTRICTIONS: No  FALLS: Has patient fallen in last 6 months? No  LIVING ENVIRONMENT: Lives with: alone  Lives in: House/apartment Stairs: 4 steps to enter; 3 story home Has following equipment at home: Shower bench, and raised commode  PLOF: Independent;   working part time as Ophthalmologist- requires sitting, standing, walking   PATIENT GOALS: improve R LE strength and bil UE strength   OBJECTIVE:  Note: Objective measures were completed at Evaluation unless otherwise noted.  DIAGNOSTIC FINDINGS: none recent  COGNITION: Overall cognitive status: Within functional limits for tasks assessed   SENSATION: Intact in B LEs to light touch   POSTURE: rounded shoulders and forward head  LOWER EXTREMITY ROM:     Active  Right Eval Left Eval  Hip flexion    Hip extension    Hip abduction    Hip adduction    Hip internal rotation    Hip external rotation    Knee flexion    Knee extension    Ankle dorsiflexion    Ankle plantarflexion    Ankle inversion    Ankle eversion     (Blank rows = not tested)  LOWER EXTREMITY MMT:    MMT Right Eval RIGHT  08/11/24 Left Eval LEFT 08/11/24  Hip flexion 3+ 4- 4 4+  Hip extension      Hip abduction 4+  4+   Hip adduction 4  4   Hip internal rotation 4+  4+   Hip external rotation 4-  4   Knee flexion 5 5 5 5   Knee extension 4- 5 4+ 5  Ankle dorsiflexion 5  5   Ankle plantarflexion 4+ 4+ 4+ 4+  Ankle inversion      Ankle eversion      (Blank rows = not tested)  UPPER EXTREMITY MMT:  MMT Right eval Left eval  Shoulder flexion 4 4  Shoulder extension    Shoulder abduction 4 4  Shoulder adduction    Shoulder extension    Shoulder internal rotation 5 5  Shoulder external rotation 5 5  Middle trapezius    Lower trapezius    Elbow flexion 4+ 5  Elbow extension 4+ 4+  Wrist flexion    Wrist extension    Wrist ulnar deviation    Wrist radial deviation    Wrist pronation    Wrist supination    Grip strength 58 40   (Blank rows = not tested)  GAIT: Gait pattern: Significant R-ward trunk lean with gait; slightly slowed gait speed  Assistive device utilized: None Level of assistance: Modified independence  FUNCTIONAL TESTS:  5x sit to stand:   walk: able to walk  535ft stopping due to the Rt leg feeling tight.  SOB level 4/10  08/11/24: 6 min walk test - 704.7 ft with break after 3 minutes (437 ft due to SOB and Rt leg feeling tight), SOB level 3/10   06/25/24 SL stance:  Rt: unable to perform without UE support; with  single arm support able to hold for 20 Lt: 3 seconds without UE support; 20 with single arm UE support   Tandem stance:  Rt: 20 with single arm UE support  Lt: 30 without UE support     TODAY'S TREATMENT:    08/11/24 NuStep UE 12/ LE 8 level 3 for 6 min - PT present to discuss progress  Re-measured  objective measurements & functional tests  Standing hip 3-way (flex, ext, abd) with 3 # AW - 2x10 for flex, abd, & ext VC for proper technique and UE support in // bars  Step taps 6 step x 10 bilaterally with UE support in // bars - VC to push through legs rather than using UE to push up onto step Standing quad stretch with foot on low plinth and chair in front for UE support Discussed POC and added standing quad stretch to HEP  08/08/24:Pt arrives for aquatic physical therapy. Treatment took place in 3.5-5.5 feet of water. Water temperature was 91 degrees F. Pt entered the pool via stairs independently. Seated water bench with 75% submersion Pt performed seated LE AROM exercises 20x in all planes, review of previous aquatic exercises, it has been > 6 mos so pt could not remember much -75% water depth for walking 3 ways with rainbow UE floats for push/pull: 6 lengths of each -Hip 3 ways 15x Bil holding onto the wall. Vc for control -high knee marching holding rainbow floats 4 lengths -Side steps holding 1 rail 10x Bil -Horseback bicycle on yellow noodle in the corner 3 min   08/04/24 NuStep UE 12/ LE 8 level 3 for 6 min - PT present to discuss progress  Standing hip 3-way (flex, ext, abd) with 2 # AW - 2x10 for flex & abd, 1x10 for ext due to increase lumbar pain - VC for proper technique and  UE support in // bars   Sidestepping in // bars x 2 laps  LAQs 2# ankle weights 2 x 10 bilateral Tandem balance in // bars 2 x 30 each Manual Therapy: STM to Lt paraspinals, Lt glut and Lt oblique with pt S/L  07/30/24 NuStep UE 12/ LE 8 level 3 for 6 min-PT present to discuss progress- stopped arms at 4 min due to arm pain Sit to Stand from chair x 8 with UE support with the last 3 reps Standing hip 3-way (flex, ext, abd) 2x10 each with VC for proper technique & rest breaks between exercises with UE support in // bars - added 2#  Sidestepping in // bars x 2 laps  Step taps 6 step x 10 bilaterally with UE support in // bars  LAQs 2# ankle weights 2 x 10 bilateral Seated adduction ball squeezes 3 hold 2x10 with VC to not hold breath when squeezing ball Tandem balance in // bars 2 x 30 each 6/10 RPE post session   PATIENT EDUCATION: Education details: per today's note Person educated: Patient Education method: Explanation, Demonstration, Tactile cues, Verbal cues, and Handouts Education comprehension: verbalized understanding and returned demonstration  HOME EXERCISE PROGRAM: Access Code: [email or web address] URL: [email or web address] Date: 06/23/2024 Prepared by: Saddie Raw  Exercises - Standing Hip Abduction with Counter Support  - 1 x daily - 5 x weekly - 2 sets - 10 reps - Side Stepping with Resistance at Ankles and Counter Support  - 1 x daily - 5 x weekly - 1 sets - 3-5 reps - Standing Hip Extension with Counter Support  - 1 x daily - 5 x weekly - 1-3 sets - 10 reps - 20-30 seconds hold - Seated Long Arc Quad with Ankle Weight  - 1 x daily - 5 x weekly - 2 sets - 10 reps - Sit to Stand  - 1 x daily - 5 x weekly - 2 sets - 10 reps - Seated Hamstring Stretch  - 1 x daily - 7 x weekly -  1-3 sets - 3 reps - 20-30 seconds hold  GOALS: Goals reviewed with patient? Yes  SHORT TERM GOALS: Target date: 06/23/24/2024  Patient to be independent with initial HEP. Baseline: HEP initiated Goal  status: MET from previous PT sessions    LONG TERM GOALS: Target date: 09/08/24  Patient to be independent with advanced HEP. Baseline: Not yet initiated  Goal status: In progress  Patient to demonstrate Rt LE strength >/=4/5.  Baseline: See above Goal status: In Progress  -    Pt will improve his walk test to being able to walk the full without rest and reaching at least 1056ft  Baseline: 579ft   Goal Status: In progress    ASSESSMENT:  CLINICAL IMPRESSION:  Pt tolerates the progression of ankle weights with standing hip 3-way with no increases in pain or discomfort. The patient also tolerates the addition of standing quadriceps stretch with the LE on the low plinth with a chair in front for UE support with decreased tightness reported following. Patient requires rest breaks throughout the session due to decreased cardiovascular endurance. He reports that he continues to have difficulty with fatigue when walking from the parking lot to the driving range while carrying his golf bag. Patient has improved global LE MMT however, Rt hip flexion continues to be <4+/5. The patient is able to walk 704.7 feet during the 6 minute walk test, which has improved 169.7 feet however, the patients goal of walking at least 1000 feet without rest has not been met. While the patient continues to make gains with physical therapy, he has yet to reach his long term goals. The patient would benefit from continued skilled physical therapy 1-2x/week for 4 more weeks to address the following impairments: decreased LE strength, decreased cardiovascular and muscular endurance, decreased balance, and impaired flexibility.   OBJECTIVE IMPAIRMENTS: Abnormal gait, decreased balance, difficulty walking, decreased ROM, decreased strength, impaired flexibility, impaired sensation, postural dysfunction, and pain.   ACTIVITY LIMITATIONS: carrying, lifting, bending, standing, squatting, stairs, transfers, bathing,  toileting, dressing, locomotion level, and caring for others  PARTICIPATION LIMITATIONS: meal prep, cleaning, laundry, shopping, community activity, occupation, yard work, and church  PERSONAL FACTORS: Age, Past/current experiences, Time since onset of injury/illness/exacerbation, and 3+ comorbidities: HFrEF, atrial fibrillation, VF cardiac arrest s/p ICD, CAD, severe aortic regurgitation s/p AVR 10/2018 and aortic root replacement with CABGx1, aortic dilation, hypertension, hypothyroidism, type 2 diabetes, multiple myeloma, CKD IIIb-IV, migraine are also affecting patient's functional outcome.   REHAB POTENTIAL: Good  CLINICAL DECISION MAKING: LOW  EVALUATION COMPLEXITY: LOW  PLAN:  PT FREQUENCY: 1-2x/week  PT DURATION: 4 weeks   PLANNED INTERVENTIONS: 97164- PT Re-evaluation, 97110-Therapeutic exercises, 97530- Therapeutic activity, 97112- Neuromuscular re-education, 97535- Self Care, [id number]- Manual therapy, [phone]- Gait training, [phone]- Canalith repositioning, [phone]- Aquatic Therapy, Patient/Family education, Balance training, Stair training, Taping, Dry Needling, Joint mobilization, Spinal mobilization, Vestibular training, DME instructions, Cryotherapy, and Moist heat  PLAN FOR NEXT SESSION: general strength like nustep, Rt LE general but hip focus, and general bil UE (no sig weakness here), [employer] carries with 5 lb KB to simulate walking with golf bag  Randall Pack, SPT  08/11/24 11:19 AM   I agree with the following treatment note after reviewing documentation. This session was performed under the supervision of a licensed clinician.  Saddie Raw, PT 08/11/24, 11:20 AM

## 2024-08-11 NOTE — Telephone Encounter (Addendum)
 Procedure:Colonoscopy Procedure date: 08/18/24 Procedure location: [facility] Arrival Time: 10:15 am Spoke with the patient Y/N: Yes, pt stated that he called to the office this morning to cancel procedure because he has to get his pacemaker placed first and it will not be done by then. So he needs to cancel procedure Any prep concerns? No  Has the patient obtained the prep from the pharmacy ? NA Do you have a care partner and transportation: NA Any additional concerns? No

## 2024-08-12 LAB — CUP PACEART REMOTE DEVICE CHECK
Battery Remaining Longevity: 0 mo
Battery Voltage: 2.57 V
Date Time Interrogation Session: 20251117074502
HighPow Impedance: 42 Ohm
HighPow Impedance: 42 Ohm
Implantable Lead Connection Status: 753985
Implantable Lead Connection Status: 753985
Implantable Lead Connection Status: 753985
Implantable Lead Implant Date: 20170525
Implantable Lead Implant Date: 20170525
Implantable Lead Implant Date: 20170525
Implantable Lead Location: 753858
Implantable Lead Location: 753859
Implantable Lead Location: 753860
Implantable Lead Model: 7122
Implantable Pulse Generator Implant Date: 20170525
Lead Channel Impedance Value: 380 Ohm
Lead Channel Impedance Value: 430 Ohm
Lead Channel Impedance Value: 540 Ohm
Lead Channel Pacing Threshold Amplitude: 0.75 V
Lead Channel Pacing Threshold Amplitude: 1.25 V
Lead Channel Pacing Threshold Pulse Width: 0.5 ms
Lead Channel Pacing Threshold Pulse Width: 0.5 ms
Lead Channel Sensing Intrinsic Amplitude: 0.2 mV
Lead Channel Sensing Intrinsic Amplitude: 11.4 mV
Lead Channel Setting Pacing Amplitude: 2 V
Lead Channel Setting Pacing Amplitude: 2.5 V
Lead Channel Setting Pacing Pulse Width: 0.5 ms
Lead Channel Setting Pacing Pulse Width: 0.5 ms
Lead Channel Setting Sensing Sensitivity: 0.5 mV
Pulse Gen Serial Number: 7357926
Zone Setting Status: 755011

## 2024-08-13 ENCOUNTER — Ambulatory Visit: Payer: Self-pay | Admitting: Internal Medicine

## 2024-08-15 ENCOUNTER — Encounter: Payer: Self-pay | Admitting: Physical Therapy

## 2024-08-15 ENCOUNTER — Ambulatory Visit: Admitting: Physical Therapy

## 2024-08-15 DIAGNOSIS — R2689 Other abnormalities of gait and mobility: Secondary | ICD-10-CM

## 2024-08-15 DIAGNOSIS — M6281 Muscle weakness (generalized): Secondary | ICD-10-CM | POA: Diagnosis not present

## 2024-08-15 DIAGNOSIS — R2681 Unsteadiness on feet: Secondary | ICD-10-CM

## 2024-08-15 NOTE — Therapy (Signed)
 OUTPATIENT PHYSICAL THERAPY TREATMENT   Patient Name: Frantz Quattrone., MD MRN: [id number] DOB:[birth year], 73 y.o., male Today's Date: 08/15/2024   PCP:  Catheryn Slocumb, MD  REFERRING PROVIDER: Catheryn Slocumb, MD   END OF SESSION:  PT End of Session - 08/15/24 1315     Visit Number 9    Number of Visits 17    Date for Recertification  09/08/24    Authorization Type none    PT Start Time 1314    Activity Tolerance Patient tolerated treatment well    Behavior During Therapy [facility] for tasks assessed/performed                  Past Medical History:  Diagnosis Date   Acute blood loss anemia    Acute encephalopathy    Acute hypoxemic respiratory failure (HCC)    Acute idiopathic gout of right ankle    Acute lumbar back pain    Acute on chronic systolic heart failure (HCC)    Acute respiratory failure (HCC)    AKI (acute kidney injury)    Altered mental status    Aortic aneurysm without rupture 02/09/2016   Aortic root enlargement 02/13/2012   Aortic valve regurgitation, acquired 02/13/2012   Arrhythmia    Atrial flutter (HCC) 03/18/2012   Back pain    Cardiac arrest (HCC) 02/01/2016   CHF (congestive heart failure) (HCC)    Chronic anticoagulation 03/04/2013   Chronic combined systolic and diastolic heart failure (HCC)    Chronic kidney disease    kidney fx studies increased    Chronic lower back pain    Chronic renal insufficiency    Chronic systolic heart failure (HCC) 02/13/2012   Recent diagnosis 4 / 2013, LVEF 25% by Echo 12/2011  03/2013: Echo at [facility] Cardiology Conclusions: 1. Left ventricular ejection fraction estimated by 2D at 40-45 percent. 2. Mild concentric left ventricular hypertrophy. 3. Mild left atrial enlargement. 4. Moderate aortic valve regurgitation. 5. The aortic root at the sinus(es) of valsalva is moderately dilated 6. Mild mitral valve regurgitation. 7.   CKD (chronic kidney disease)    CKD (chronic kidney disease), stage IV (HCC)  08/06/2013   Creatinine 2.4 on 07/04/13    Claustrophobia    Colon cancer screening 03/04/2013   Debility 02/22/2016   Diabetes mellitus type 2 in nonobese [facility])    Diabetes mellitus type 2 in obese    Dysrhythmia    palpitations   Encounter for central line placement    Exertional dyspnea 01/2012   Femoral nerve injury 02/22/2016   Femoral neuropathy    Goals of care, counseling/discussion 05/05/2020   HCAP (healthcare-associated pneumonia)    Heart murmur    Hyperlipidemia 02/13/2012   Hypertension    Hypothyroidism    Internal hemorrhoids without mention of complication 04/11/2013   Labile blood pressure    Left bundle branch block 02/13/2012   Leg weakness, bilateral    Long term (current) use of anticoagulants 08/06/2013   Eliquis  therapy    Long term current use of amiodarone  08/07/2016   Lower extremity weakness    Migraine 02/13/2012   opthalmic   Multiple myeloma (HCC) 05/05/2020   Non-traumatic rhabdomyolysis    Obesity (BMI 30-39.9) 02/13/2012   Pain    Paroxysmal atrial fibrillation (HCC)    Pneumonia    Retroperitoneal bleed    Right ankle pain    Right knee pain    Severe aortic regurgitation 02/09/2016   Special screening for malignant neoplasms, colon 04/11/2013  Thyroid  activity decreased    Tibial pain    Varicose vein of leg    right   Ventricular fibrillation (HCC) 02/09/2016   Weakness of both lower extremities    Past Surgical History:  Procedure Laterality Date   AORTIC VALVE REPLACEMENT  11/20/2018   CARDIAC CATHETERIZATION N/A 02/17/2016   Procedure: Left Heart Cath and Coronary Angiography;  Surgeon: Candyce GORMAN Reek, MD;  Location: [facility] INVASIVE CV LAB;  Service: Cardiovascular;  Laterality: N/A;   CARDIOVERSION  03/22/2012   Procedure: CARDIOVERSION;  Surgeon: Oneil Parchment, MD;  Location: [facility] ENDOSCOPY;  Service: Cardiovascular;  Laterality: N/A;   CARDIOVERSION  04/19/2012   Procedure: CARDIOVERSION;  Surgeon: Victory LELON Claudene DOUGLAS, MD;   Location: [facility] OR;  Service: Cardiovascular;  Laterality: N/A;   COLONOSCOPY N/A 04/11/2013   Procedure: COLONOSCOPY;  Surgeon: Lamar JONETTA Aho, MD;  Location: [facility] ENDOSCOPY;  Service: Endoscopy;  Laterality: N/A;   COLONOSCOPY N/A 04/11/2013   Procedure: COLONOSCOPY;  Surgeon: Lamar JONETTA Aho, MD;  Location: [facility] ENDOSCOPY;  Service: Endoscopy;  Laterality: N/A;   EP IMPLANTABLE DEVICE N/A 02/17/2016   Procedure: BiV ICD Insertion CRT-D;  Surgeon: Danelle LELON Birmingham, MD;  Location: [facility] INVASIVE CV LAB;  Service: Cardiovascular;  Laterality: N/A;   FINGER SURGERY  2012   4th digit right hand; thumb on left hand   RADIOLOGY WITH ANESTHESIA N/A 02/11/2016   Procedure: MRI OF THE BRAIN WITHOUT CONTRAST, LUMBAR WITHOUT CONTRAST;  Surgeon: Medication Radiologist, MD;  Location: [facility] OR;  Service: Radiology;  Laterality: N/A;  DR. WOOD/MRI   RIGHT/LEFT HEART CATH AND CORONARY ANGIOGRAPHY N/A 07/26/2018   Procedure: RIGHT/LEFT HEART CATH AND CORONARY ANGIOGRAPHY;  Surgeon: Claudene Victory LELON, MD;  Location: [facility] INVASIVE CV LAB;  Service: Cardiovascular;  Laterality: N/A;   Skin melanocytoma excision  2012   above left clavicle   STERNAL INCISION RECLOSURE  11/2018   STERNAL WIRE REMOVAL  11/2018   STERNAL WOUND DEBRIDEMENT  11/2018   TEE WITHOUT CARDIOVERSION  03/22/2012   Procedure: TRANSESOPHAGEAL ECHOCARDIOGRAM (TEE);  Surgeon: Oneil Parchment, MD;  Location: [facility] ENDOSCOPY;  Service: Cardiovascular;  Laterality: N/A;   TEE WITHOUT CARDIOVERSION N/A 02/08/2016   Procedure: TRANSESOPHAGEAL ECHOCARDIOGRAM (TEE);  Surgeon: Redell GORMAN Shallow, MD;  Location: [facility] ENDOSCOPY;  Service: Cardiovascular;  Laterality: N/A;   Patient Active Problem List   Diagnosis Date Noted   Umbilical hernia without obstruction and without gangrene 05/11/2024   Chalazion of right upper eyelid 05/11/2024   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 05/11/2024   Venous stasis 05/11/2024   Adjustment insomnia 01/01/2024    Presence of heart assist device (HCC) 12/31/2023   Enlarged prostate 09/10/2023   Dyslipidemia due to type 2 diabetes mellitus (HCC) 09/10/2023   Acquired thrombophilia 05/14/2023   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   Goals of care, counseling/discussion 05/05/2020   Multiple myeloma (HCC) 05/05/2020   S/P flap graft 01/07/2019   Coronary artery disease 01/07/2019   Long term (current) use of antibiotics 01/03/2019   Essential hypertension 12/27/2018   Postoperative infection of wound of sternum 12/27/2018   Decreased strength, endurance, and mobility 12/27/2018   Cardiac LV ejection fraction 30-35% 12/27/2018   Decreased activities of daily living (ADL) 12/27/2018   Klebsiella infection 12/27/2018   S/P AVR (aortic valve replacement) 11/23/2018   S/P ascending aortic aneurysm repair 11/23/2018   NICM (nonischemic cardiomyopathy) (HCC) 11/19/2018   AICD (automatic cardioverter/defibrillator) present 11/19/2018   Other symptoms and signs  involving the musculoskeletal system 05/08/2018   Long term current use of amiodarone  08/07/2016   Type 2 diabetes mellitus with stage 3b chronic kidney disease, without long-term current use of insulin  (HCC)    Femoral neuropathy    Leg weakness, bilateral    PAF (paroxysmal atrial fibrillation) (HCC)    Thyroid  activity decreased    Arrhythmia    Long term current use of anticoagulant 03/04/2013   Atrial flutter (HCC) 03/18/2012    Class: Acute   Chronic combined systolic and diastolic CHF (congestive heart failure) (HCC) 02/13/2012    Class: Acute   Hypertension, accelerated 02/13/2012   Hyperlipidemia 02/13/2012   Left bundle branch block 02/13/2012   Obesity (BMI 30-39.9) 02/13/2012    ONSET DATE:   REFERRING DIAG:  Diagnosis R53.1,Z74.09,R68.89 (ICD-10-CM) - Decreased strength, endurance, and mobility   THERAPY DIAG:  Unsteadiness on feet  Muscle weakness (generalized)  Other abnormalities of gait and  mobility  Rationale for Evaluation and Treatment: Rehabilitation  SUBJECTIVE:                                                                                                                                                                                             SUBJECTIVE STATEMENT: Wore out the next day after the pool but I recovered. I think or current level is good, no need to add harder stuff today. Exercising in the water is deceptive, feels easy until the next day.   Eval: I have peripheral neuropathy in the Rt leg.  I am also noticing some weakness in both of the arms maybe from taking the ozempic .  I may want to do more aquatic therapy.  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  I have tingling on the inner groin, knee and front of the leg.  Nothing really in the foot.   I am playing 9 holes of golf lately.  I walk at home.  I go back to the [employer] to ride the bike.    PERTINENT HISTORY: , atrial fibrillation, cardiac arrest s/p ICD, CAD, severe aortic regurgitation s/p AVR 10/2018 and aortic root replacement with CABGx1, aortic dilation, hypertension, hypothyroidism, type 2 diabetes, multiple myeloma, CKD IIIb-IV, migraine  PAIN:  Are you having pain? No, just tightness in bilateral quadriceps  PRECAUTIONS: Fall and ICD/Pacemaker  RED FLAGS: None   WEIGHT BEARING RESTRICTIONS: No  FALLS: Has patient fallen in last 6 months? No  LIVING ENVIRONMENT: Lives with: alone  Lives in: House/apartment Stairs: 4 steps to enter; 3 story home Has following equipment at home: Shower bench, and raised commode  PLOF: Independent;  working part time as Clinical Research Associate- requires sitting, standing, walking   PATIENT GOALS: improve R LE strength and bil UE strength   OBJECTIVE:  Note: Objective measures were completed at Evaluation unless otherwise noted.  DIAGNOSTIC FINDINGS: none recent  COGNITION: Overall cognitive status: Within  functional limits for tasks assessed   SENSATION: Intact in B LEs to light touch   POSTURE: rounded shoulders and forward head  LOWER EXTREMITY ROM:     Active  Right Eval Left Eval  Hip flexion    Hip extension    Hip abduction    Hip adduction    Hip internal rotation    Hip external rotation    Knee flexion    Knee extension    Ankle dorsiflexion    Ankle plantarflexion    Ankle inversion    Ankle eversion     (Blank rows = not tested)  LOWER EXTREMITY MMT:    MMT Right Eval RIGHT  08/11/24 Left Eval LEFT 08/11/24  Hip flexion 3+ 4- 4 4+  Hip extension      Hip abduction 4+  4+   Hip adduction 4  4   Hip internal rotation 4+  4+   Hip external rotation 4-  4   Knee flexion 5 5 5 5   Knee extension 4- 5 4+ 5  Ankle dorsiflexion 5  5   Ankle plantarflexion 4+ 4+ 4+ 4+  Ankle inversion      Ankle eversion      (Blank rows = not tested)  UPPER EXTREMITY MMT:  MMT Right eval Left eval  Shoulder flexion 4 4  Shoulder extension    Shoulder abduction 4 4  Shoulder adduction    Shoulder extension    Shoulder internal rotation 5 5  Shoulder external rotation 5 5  Middle trapezius    Lower trapezius    Elbow flexion 4+ 5  Elbow extension 4+ 4+  Wrist flexion    Wrist extension    Wrist ulnar deviation    Wrist radial deviation    Wrist pronation    Wrist supination    Grip strength 58 40   (Blank rows = not tested)  GAIT: Gait pattern: Significant R-ward trunk lean with gait; slightly slowed gait speed  Assistive device utilized: None Level of assistance: Modified independence  FUNCTIONAL TESTS:  5x sit to stand:   walk: able to walk 544ft stopping due to the Rt leg feeling tight.  SOB level 4/10  08/11/24: 6 min walk test - 704.7 ft with break after 3 minutes (437 ft due to SOB and Rt leg feeling tight), SOB level 3/10   06/25/24 SL stance:  Rt: unable to perform without UE support; with  single arm support able to hold for 20 Lt: 3  seconds without UE support; 20 with single arm UE support   Tandem stance:  Rt: 20 with single arm UE support  Lt: 30 without UE support     TODAY'S TREATMENT:    08/15/24:Pt arrives for aquatic physical therapy. Treatment took place in 3.5-5.5 feet of water. Water temperature was 91 degrees F. Pt entered the pool via stairs independently. Seated water bench with 75% submersion Pt performed seated LE AROM exercises 20x in all planes, concurrent review of status to prepare for todays session.  -75% water depth for walking 3 ways with rainbow UE floats for push/pull: 6 lengths of each -Suitcase carry; 1 yellow float by his side for 2 lengths, then  change sides for 2 lengths -Hip 3 ways 15x Bil holding onto the wall. Vc for control -high knee marching holding rainbow floats 4 lengths -Side steps holding 1 rail 10x Bil -Horseback bicycle on yellow noodle in the corner 3 min    08/11/24 NuStep UE 12/ LE 8 level 3 for 6 min - PT present to discuss progress  Re-measured objective measurements & functional tests  Standing hip 3-way (flex, ext, abd) with 3 # AW - 2x10 for flex, abd, & ext VC for proper technique and UE support in // bars  Step taps 6 step x 10 bilaterally with UE support in // bars - VC to push through legs rather than using UE to push up onto step Standing quad stretch with foot on low plinth and chair in front for UE support Discussed POC and added standing quad stretch to HEP  08/08/24:Pt arrives for aquatic physical therapy. Treatment took place in 3.5-5.5 feet of water. Water temperature was 91 degrees F. Pt entered the pool via stairs independently. Seated water bench with 75% submersion Pt performed seated LE AROM exercises 20x in all planes, review of previous aquatic exercises, it has been > 6 mos so pt could not remember much -75% water depth for walking 3 ways with rainbow UE floats for push/pull: 6 lengths of each -Hip 3 ways 15x Bil holding onto the wall. Vc  for control -high knee marching holding rainbow floats 4 lengths -Side steps holding 1 rail 10x Bil -Horseback bicycle on yellow noodle in the corner 3 min   PATIENT EDUCATION: Education details: per today's note Person educated: Patient Education method: Programmer, Multimedia, Demonstration, Tactile cues, Verbal cues, and Handouts Education comprehension: verbalized understanding and returned demonstration  HOME EXERCISE PROGRAM: Access Code: [email or web address] URL: [email or web address] Date: 06/23/2024 Prepared by: Saddie Raw  Exercises - Standing Hip Abduction with Counter Support  - 1 x daily - 5 x weekly - 2 sets - 10 reps - Side Stepping with Resistance at Ankles and Counter Support  - 1 x daily - 5 x weekly - 1 sets - 3-5 reps - Standing Hip Extension with Counter Support  - 1 x daily - 5 x weekly - 1-3 sets - 10 reps - 20-30 seconds hold - Seated Long Arc Quad with Ankle Weight  - 1 x daily - 5 x weekly - 2 sets - 10 reps - Sit to Stand  - 1 x daily - 5 x weekly - 2 sets - 10 reps - Seated Hamstring Stretch  - 1 x daily - 7 x weekly - 1-3 sets - 3 reps - 20-30 seconds hold  GOALS: Goals reviewed with patient? Yes  SHORT TERM GOALS: Target date: 06/23/24/2024  Patient to be independent with initial HEP. Baseline: HEP initiated Goal status: MET from previous PT sessions    LONG TERM GOALS: Target date: 09/08/24  Patient to be independent with advanced HEP. Baseline: Not yet initiated  Goal status: In progress  Patient to demonstrate Rt LE strength >/=4/5.  Baseline: See above Goal status: In Progress  -    Pt will improve his walk test to being able to walk the full without rest and reaching at least 1021ft  Baseline: 572ft   Goal Status: In progress    ASSESSMENT:  CLINICAL IMPRESSION: pt reports a full day of fatigue after his last aquatic treatment. We were careful today to not push too much harder since his current level, he reports, is challenging  to his  endurance.  OBJECTIVE IMPAIRMENTS: Abnormal gait, decreased balance, difficulty walking, decreased ROM, decreased strength, impaired flexibility, impaired sensation, postural dysfunction, and pain.   ACTIVITY LIMITATIONS: carrying, lifting, bending, standing, squatting, stairs, transfers, bathing, toileting, dressing, locomotion level, and caring for others  PARTICIPATION LIMITATIONS: meal prep, cleaning, laundry, shopping, community activity, occupation, yard work, and church  PERSONAL FACTORS: Age, Past/current experiences, Time since onset of injury/illness/exacerbation, and 3+ comorbidities: HFrEF, atrial fibrillation, VF cardiac arrest s/p ICD, CAD, severe aortic regurgitation s/p AVR 10/2018 and aortic root replacement with CABGx1, aortic dilation, hypertension, hypothyroidism, type 2 diabetes, multiple myeloma, CKD IIIb-IV, migraine are also affecting patient's functional outcome.   REHAB POTENTIAL: Good  CLINICAL DECISION MAKING: LOW  EVALUATION COMPLEXITY: LOW  PLAN:  PT FREQUENCY: 1-2x/week  PT DURATION: 4 weeks   PLANNED INTERVENTIONS: [id number]- PT Re-evaluation, 97110-Therapeutic exercises, 97530- Therapeutic activity, 97112- Neuromuscular re-education, 97535- Self Care, [id number]- Manual therapy, [phone]- Gait training, [phone]- Canalith repositioning, [phone]- Aquatic Therapy, Patient/Family education, Balance training, Stair training, Taping, Dry Needling, Joint mobilization, Spinal mobilization, Vestibular training, DME instructions, Cryotherapy, and Moist heat  PLAN FOR NEXT SESSION: general strength like nustep, Rt LE general but hip focus, and general bil UE (no sig weakness here), Farmers carries with 5 lb KB to simulate walking with golf bag  Delon Darner, PTA 08/15/24 1:15 PM

## 2024-08-18 ENCOUNTER — Ambulatory Visit (HOSPITAL_COMMUNITY): Admit: 2024-08-18 | Admitting: Gastroenterology

## 2024-08-18 ENCOUNTER — Encounter (HOSPITAL_COMMUNITY): Payer: Self-pay

## 2024-08-18 SURGERY — COLONOSCOPY
Anesthesia: Monitor Anesthesia Care

## 2024-08-19 ENCOUNTER — Other Ambulatory Visit (HOSPITAL_BASED_OUTPATIENT_CLINIC_OR_DEPARTMENT_OTHER): Payer: Self-pay | Admitting: Cardiovascular Disease

## 2024-08-19 DIAGNOSIS — I480 Paroxysmal atrial fibrillation: Secondary | ICD-10-CM

## 2024-08-25 ENCOUNTER — Ambulatory Visit

## 2024-08-25 DIAGNOSIS — M6281 Muscle weakness (generalized): Secondary | ICD-10-CM | POA: Insufficient documentation

## 2024-08-25 DIAGNOSIS — R2689 Other abnormalities of gait and mobility: Secondary | ICD-10-CM | POA: Insufficient documentation

## 2024-08-25 DIAGNOSIS — R2681 Unsteadiness on feet: Secondary | ICD-10-CM | POA: Insufficient documentation

## 2024-08-25 NOTE — Therapy (Signed)
 OUTPATIENT PHYSICAL THERAPY TREATMENT   Patient Name: Eric [name]., MD MRN: [id number] DOB:07-Feb-1951, 73 y.o., male Today's Date: 08/25/2024   PCP:  Catheryn Slocumb, MD  REFERRING PROVIDER: Catheryn Slocumb, MD   END OF SESSION:  PT End of Session - 08/25/24 1215     Visit Number 10    Number of Visits 17    Date for Recertification  09/08/24    Authorization Type none    PT Start Time 1212   pt arrived late   PT Stop Time 1256    PT Time Calculation (min) 44 min    Activity Tolerance Patient tolerated treatment well    Behavior During Therapy [facility] for tasks assessed/performed                  Past Medical History:  Diagnosis Date   Acute blood loss anemia    Acute encephalopathy    Acute hypoxemic respiratory failure (HCC)    Acute idiopathic gout of right ankle    Acute lumbar back pain    Acute on chronic systolic heart failure (HCC)    Acute respiratory failure (HCC)    AKI (acute kidney injury)    Altered mental status    Aortic aneurysm without rupture 02/09/2016   Aortic root enlargement 02/13/2012   Aortic valve regurgitation, acquired 02/13/2012   Arrhythmia    Atrial flutter (HCC) 03/18/2012   Back pain    Cardiac arrest (HCC) 02/01/2016   CHF (congestive heart failure) (HCC)    Chronic anticoagulation 03/04/2013   Chronic combined systolic and diastolic heart failure (HCC)    Chronic kidney disease    kidney fx studies increased    Chronic lower back pain    Chronic renal insufficiency    Chronic systolic heart failure (HCC) 02/13/2012   Recent diagnosis 4 / 2013, LVEF 25% by Echo 12/2011  03/2013: Echo at [facility] Cardiology Conclusions: 1. Left ventricular ejection fraction estimated by 2D at 40-45 percent. 2. Mild concentric left ventricular hypertrophy. 3. Mild left atrial enlargement. 4. Moderate aortic valve regurgitation. 5. The aortic root at the sinus(es) of valsalva is moderately dilated 6. Mild mitral valve regurgitation. 7.   CKD  (chronic kidney disease)    CKD (chronic kidney disease), stage IV (HCC) 08/06/2013   Creatinine 2.4 on 07/04/13    Claustrophobia    Colon cancer screening 03/04/2013   Debility 02/22/2016   Diabetes mellitus type 2 in nonobese [facility])    Diabetes mellitus type 2 in obese    Dysrhythmia    palpitations   Encounter for central line placement    Exertional dyspnea 01/2012   Femoral nerve injury 02/22/2016   Femoral neuropathy    Goals of care, counseling/discussion 05/05/2020   HCAP (healthcare-associated pneumonia)    Heart murmur    Hyperlipidemia 02/13/2012   Hypertension    Hypothyroidism    Internal hemorrhoids without mention of complication 04/11/2013   Labile blood pressure    Left bundle branch block 02/13/2012   Leg weakness, bilateral    Long term (current) use of anticoagulants 08/06/2013   Eliquis  therapy    Long term current use of amiodarone  08/07/2016   Lower extremity weakness    Migraine 02/13/2012   opthalmic   Multiple myeloma (HCC) 05/05/2020   Non-traumatic rhabdomyolysis    Obesity (BMI 30-39.9) 02/13/2012   Pain    Paroxysmal atrial fibrillation (HCC)    Pneumonia    Retroperitoneal bleed    Right ankle pain  Right knee pain    Severe aortic regurgitation 02/09/2016   Special screening for malignant neoplasms, colon 04/11/2013   Thyroid  activity decreased    Tibial pain    Varicose vein of leg    right   Ventricular fibrillation (HCC) 02/09/2016   Weakness of both lower extremities    Past Surgical History:  Procedure Laterality Date   AORTIC VALVE REPLACEMENT  11/20/2018   CARDIAC CATHETERIZATION N/A 02/17/2016   Procedure: Left Heart Cath and Coronary Angiography;  Surgeon: Candyce GORMAN Reek, MD;  Location: [facility] INVASIVE CV LAB;  Service: Cardiovascular;  Laterality: N/A;   CARDIOVERSION  03/22/2012   Procedure: CARDIOVERSION;  Surgeon: Oneil Parchment, MD;  Location: [facility] ENDOSCOPY;  Service: Cardiovascular;  Laterality: N/A;    CARDIOVERSION  04/19/2012   Procedure: CARDIOVERSION;  Surgeon: Victory LELON Claudene DOUGLAS, MD;  Location: [facility] OR;  Service: Cardiovascular;  Laterality: N/A;   COLONOSCOPY N/A 04/11/2013   Procedure: COLONOSCOPY;  Surgeon: Lamar JONETTA Aho, MD;  Location: [facility] ENDOSCOPY;  Service: Endoscopy;  Laterality: N/A;   COLONOSCOPY N/A 04/11/2013   Procedure: COLONOSCOPY;  Surgeon: Lamar JONETTA Aho, MD;  Location: [facility] ENDOSCOPY;  Service: Endoscopy;  Laterality: N/A;   EP IMPLANTABLE DEVICE N/A 02/17/2016   Procedure: BiV ICD Insertion CRT-D;  Surgeon: Danelle LELON Birmingham, MD;  Location: [facility] INVASIVE CV LAB;  Service: Cardiovascular;  Laterality: N/A;   FINGER SURGERY  2012   4th digit right hand; thumb on left hand   RADIOLOGY WITH ANESTHESIA N/A 02/11/2016   Procedure: MRI OF THE BRAIN WITHOUT CONTRAST, LUMBAR WITHOUT CONTRAST;  Surgeon: Medication Radiologist, MD;  Location: [facility] OR;  Service: Radiology;  Laterality: N/A;  DR. WOOD/MRI   RIGHT/LEFT HEART CATH AND CORONARY ANGIOGRAPHY N/A 07/26/2018   Procedure: RIGHT/LEFT HEART CATH AND CORONARY ANGIOGRAPHY;  Surgeon: Claudene Victory LELON, MD;  Location: [facility] INVASIVE CV LAB;  Service: Cardiovascular;  Laterality: N/A;   Skin melanocytoma excision  2012   above left clavicle   STERNAL INCISION RECLOSURE  11/2018   STERNAL WIRE REMOVAL  11/2018   STERNAL WOUND DEBRIDEMENT  11/2018   TEE WITHOUT CARDIOVERSION  03/22/2012   Procedure: TRANSESOPHAGEAL ECHOCARDIOGRAM (TEE);  Surgeon: Oneil Parchment, MD;  Location: [facility] ENDOSCOPY;  Service: Cardiovascular;  Laterality: N/A;   TEE WITHOUT CARDIOVERSION N/A 02/08/2016   Procedure: TRANSESOPHAGEAL ECHOCARDIOGRAM (TEE);  Surgeon: Redell GORMAN Shallow, MD;  Location: [facility] ENDOSCOPY;  Service: Cardiovascular;  Laterality: N/A;   Patient Active Problem List   Diagnosis Date Noted   Umbilical hernia without obstruction and without gangrene 05/11/2024   Chalazion of right upper eyelid 05/11/2024   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 05/11/2024   Venous stasis 05/11/2024   Adjustment insomnia 01/01/2024   Presence of heart assist device (HCC) 12/31/2023   Enlarged prostate 09/10/2023   Dyslipidemia due to type 2 diabetes mellitus (HCC) 09/10/2023   Acquired thrombophilia 05/14/2023   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   Goals of care, counseling/discussion 05/05/2020   Multiple myeloma (HCC) 05/05/2020   S/P flap graft 01/07/2019   Coronary artery disease 01/07/2019   Long term (current) use of antibiotics 01/03/2019   Essential hypertension 12/27/2018   Postoperative infection of wound of sternum 12/27/2018   Decreased strength, endurance, and mobility 12/27/2018   Cardiac LV ejection fraction 30-35% 12/27/2018   Decreased activities of daily living (ADL) 12/27/2018   Klebsiella infection 12/27/2018   S/P AVR (aortic valve replacement) 11/23/2018   S/P ascending aortic aneurysm repair  11/23/2018   NICM (nonischemic cardiomyopathy) (HCC) 11/19/2018   AICD (automatic cardioverter/defibrillator) present 11/19/2018   Other symptoms and signs involving the musculoskeletal system 05/08/2018   Long term current use of amiodarone  08/07/2016   Type 2 diabetes mellitus with stage 3b chronic kidney disease, without long-term current use of insulin  (HCC)    Femoral neuropathy    Leg weakness, bilateral    PAF (paroxysmal atrial fibrillation) (HCC)    Thyroid  activity decreased    Arrhythmia    Long term current use of anticoagulant 03/04/2013   Atrial flutter (HCC) 03/18/2012    Class: Acute   Chronic combined systolic and diastolic CHF (congestive heart failure) (HCC) 02/13/2012    Class: Acute   Hypertension, accelerated 02/13/2012   Hyperlipidemia 02/13/2012   Left bundle branch block 02/13/2012   Obesity (BMI 30-39.9) 02/13/2012    ONSET DATE:   REFERRING DIAG:  Diagnosis R53.1,Z74.09,R68.89 (ICD-10-CM) - Decreased strength, endurance, and mobility   THERAPY DIAG:   Unsteadiness on feet  Muscle weakness (generalized)  Other abnormalities of gait and mobility  Rationale for Evaluation and Treatment: Rehabilitation  SUBJECTIVE:                                                                                                                                                                                             SUBJECTIVE STATEMENT: I played golf again with my son over the weekend. He placed my bag in the cart for me and then we played 9 holes. I felt good during and after, I wasn't overally fatigued. My son noticed I had walked really good and not relied on the golf cart as much as I could have which was good. HEP is going good.    Eval: I have peripheral neuropathy in the Rt leg.  I am also noticing some weakness in both of the arms maybe from taking the ozempic .  I may want to do more aquatic therapy.  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  I have tingling on the inner groin, knee and front of the leg.  Nothing really in the foot.   I am playing 9 holes of golf lately.  I walk at home.  I go back to the [employer] to ride the bike.    PERTINENT HISTORY: , atrial fibrillation, cardiac arrest s/p ICD, CAD, severe aortic regurgitation s/p AVR 10/2018 and aortic root replacement with CABGx1, aortic dilation, hypertension, hypothyroidism, type 2 diabetes, multiple myeloma, CKD IIIb-IV, migraine  PAIN:  Are you having pain? No, just tightness in bilateral quadriceps  PRECAUTIONS: Fall and ICD/Pacemaker  RED FLAGS: None   WEIGHT BEARING RESTRICTIONS: No  FALLS: Has patient fallen in last 6 months? No  LIVING ENVIRONMENT: Lives with: alone  Lives in: House/apartment Stairs: 4 steps to enter; 3 story home Has following equipment at home: Shower bench, and raised commode  PLOF: Independent;  working part time as Ophthalmologist- requires sitting, standing, walking   PATIENT GOALS: improve R LE strength  and bil UE strength   OBJECTIVE:  Note: Objective measures were completed at Evaluation unless otherwise noted.  DIAGNOSTIC FINDINGS: none recent  COGNITION: Overall cognitive status: Within functional limits for tasks assessed   SENSATION: Intact in B LEs to light touch   POSTURE: rounded shoulders and forward head  LOWER EXTREMITY ROM:     Active  Right Eval Left Eval  Hip flexion    Hip extension    Hip abduction    Hip adduction    Hip internal rotation    Hip external rotation    Knee flexion    Knee extension    Ankle dorsiflexion    Ankle plantarflexion    Ankle inversion    Ankle eversion     (Blank rows = not tested)  LOWER EXTREMITY MMT:    MMT Right Eval RIGHT  08/11/24 Left Eval LEFT 08/11/24  Hip flexion 3+ 4- 4 4+  Hip extension      Hip abduction 4+  4+   Hip adduction 4  4   Hip internal rotation 4+  4+   Hip external rotation 4-  4   Knee flexion 5 5 5 5   Knee extension 4- 5 4+ 5  Ankle dorsiflexion 5  5   Ankle plantarflexion 4+ 4+ 4+ 4+  Ankle inversion      Ankle eversion      (Blank rows = not tested)  UPPER EXTREMITY MMT:  MMT Right eval Left eval  Shoulder flexion 4 4  Shoulder extension    Shoulder abduction 4 4  Shoulder adduction    Shoulder extension    Shoulder internal rotation 5 5  Shoulder external rotation 5 5  Middle trapezius    Lower trapezius    Elbow flexion 4+ 5  Elbow extension 4+ 4+  Wrist flexion    Wrist extension    Wrist ulnar deviation    Wrist radial deviation    Wrist pronation    Wrist supination    Grip strength 58 40   (Blank rows = not tested)  GAIT: Gait pattern: Significant R-ward trunk lean with gait; slightly slowed gait speed  Assistive device utilized: None Level of assistance: Modified independence  FUNCTIONAL TESTS:  5x sit to stand:   walk: able to walk 550ft stopping due to the Rt leg feeling tight.  SOB level 4/10  08/11/24: 6 min walk test - 704.7 ft with break  after 3 minutes (437 ft due to SOB and Rt leg feeling tight), SOB level 3/10   06/25/24 SL stance:  Rt: unable to perform without UE support; with  single arm support able to hold for 20 Lt: 3 seconds without UE support; 20 with single arm UE support   Tandem stance:  Rt: 20 with single arm UE support  Lt: 30 without UE support     TODAY'S TREATMENT:   08/25/24: Therapeutic Exercises NuStep UE 12/ LE 9 level 4 x 2 min, then level 3 x 5 min for a total of 7 min - PTA present to discuss current functional status since last  appt Seated bil HS stretch (during rest break of hip 3 way raises) x 2 reps, x 20 sec holds, VC's for erect posture for most beneficial stretch Seated bil piriformis figure 4 stretch 2 reps, x 20 sec each returning therapist demo Standing with foot in chair behind him for quad stretch and holding // bars x 2 reps, 20 sec holds each Neuromuscular Re Ed In // bars with 3# added to each ankle for hip 3 way raises (flex, abd, seated rest, and then ext) x 10 each with SLS on blue oval pad for stability except for ext was done on level surface as pt was reporting increased hip fatigue Hip hiking standing on 4 step x 10 each returning therapist demo and tactile cues for correct technique, very challenging on Rt > Lt side (Seated rest with above piriformis stretch) Therapeutic Activities Front 4 step ups with 3 SLS on step with each rep, 2 x 5 with tightness reported in Rt quad during Rt SLS (Standing above quad stretch), then seated rest after Toe taps on 4 step today (decreased step due to LE fatigue from new exs) x 10 each Seated rest after and then leg press at end of session  Leg Press 60# x 10, this was challenging for pt  08/15/24:Pt arrives for aquatic physical therapy. Treatment took place in 3.5-5.5 feet of water. Water temperature was 91 degrees F. Pt entered the pool via stairs independently. Seated water bench with 75% submersion Pt performed seated LE AROM  exercises 20x in all planes, concurrent review of status to prepare for todays session.  -75% water depth for walking 3 ways with rainbow UE floats for push/pull: 6 lengths of each -Suitcase carry; 1 yellow float by his side for 2 lengths, then change sides for 2 lengths -Hip 3 ways 15x Bil holding onto the wall. Vc for control -high knee marching holding rainbow floats 4 lengths -Side steps holding 1 rail 10x Bil -Horseback bicycle on yellow noodle in the corner 3 min    08/11/24 NuStep UE 12/ LE 8 level 3 for 6 min - PT present to discuss progress  Re-measured objective measurements & functional tests  Standing hip 3-way (flex, ext, abd) with 3 # AW - 2x10 for flex, abd, & ext VC for proper technique and UE support in // bars  Step taps 6 step x 10 bilaterally with UE support in // bars - VC to push through legs rather than using UE to push up onto step Standing quad stretch with foot on low plinth and chair in front for UE support Discussed POC and added standing quad stretch to HEP  08/08/24:Pt arrives for aquatic physical therapy. Treatment took place in 3.5-5.5 feet of water. Water temperature was 91 degrees F. Pt entered the pool via stairs independently. Seated water bench with 75% submersion Pt performed seated LE AROM exercises 20x in all planes, review of previous aquatic exercises, it has been > 6 mos so pt could not remember much -75% water depth for walking 3 ways with rainbow UE floats for push/pull: 6 lengths of each -Hip 3 ways 15x Bil holding onto the wall. Vc for control -high knee marching holding rainbow floats 4 lengths -Side steps holding 1 rail 10x Bil -Horseback bicycle on yellow noodle in the corner 3 min   PATIENT EDUCATION: Education details: per today's note Person educated: Patient Education method: Programmer, Multimedia, Demonstration, Actor cues, Verbal cues, and Handouts Education comprehension: verbalized understanding and returned demonstration  HOME  EXERCISE PROGRAM: Access Code: [email or web address] URL: [email or web address] Date: 06/23/2024 Prepared by: Saddie Raw  Exercises - Standing Hip Abduction with Counter Support  - 1 x daily - 5 x weekly - 2 sets - 10 reps - Side Stepping with Resistance at Ankles and Counter Support  - 1 x daily - 5 x weekly - 1 sets - 3-5 reps - Standing Hip Extension with Counter Support  - 1 x daily - 5 x weekly - 1-3 sets - 10 reps - 20-30 seconds hold - Seated Long Arc Quad with Ankle Weight  - 1 x daily - 5 x weekly - 2 sets - 10 reps - Sit to Stand  - 1 x daily - 5 x weekly - 2 sets - 10 reps - Seated Hamstring Stretch  - 1 x daily - 7 x weekly - 1-3 sets - 3 reps - 20-30 seconds hold  GOALS: Goals reviewed with patient? Yes  SHORT TERM GOALS: Target date: 06/23/24/2024  Patient to be independent with initial HEP. Baseline: HEP initiated Goal status: MET from previous PT sessions    LONG TERM GOALS: Target date: 09/08/24  Patient to be independent with advanced HEP. Baseline: Not yet initiated  Goal status: In progress  Patient to demonstrate Rt LE strength >/=4/5.  Baseline: See above Goal status: In Progress  -    Pt will improve his walk test to being able to walk the full without rest and reaching at least 1073ft  Baseline: 585ft   Goal Status: In progress    ASSESSMENT:  CLINICAL IMPRESSION: Pt reports his fatigue was less after last aquatic session. He was also able to play 9 holes of golf with his son this past weekend. They used a golf cart for the course, but he was able to walk the holes without issue on the course and felt good after as well. Progressed pt with keeping ankle weights the same, but added SLS on blue oval for hip 3 ways which was challenging for him. He struggles with hip abductor weakness so added hip hiking ex which was difficult due to muscle weakness. Also added leg press. Pt requires multiple seated rest breaks during session due to LE fatigue  and Rt quad tightness.   OBJECTIVE IMPAIRMENTS: Abnormal gait, decreased balance, difficulty walking, decreased ROM, decreased strength, impaired flexibility, impaired sensation, postural dysfunction, and pain.   ACTIVITY LIMITATIONS: carrying, lifting, bending, standing, squatting, stairs, transfers, bathing, toileting, dressing, locomotion level, and caring for others  PARTICIPATION LIMITATIONS: meal prep, cleaning, laundry, shopping, community activity, occupation, yard work, and church  PERSONAL FACTORS: Age, Past/current experiences, Time since onset of injury/illness/exacerbation, and 3+ comorbidities: HFrEF, atrial fibrillation, VF cardiac arrest s/p ICD, CAD, severe aortic regurgitation s/p AVR 10/2018 and aortic root replacement with CABGx1, aortic dilation, hypertension, hypothyroidism, type 2 diabetes, multiple myeloma, CKD IIIb-IV, migraine are also affecting patient's functional outcome.   REHAB POTENTIAL: Good  CLINICAL DECISION MAKING: LOW  EVALUATION COMPLEXITY: LOW  PLAN:  PT FREQUENCY: 1-2x/week  PT DURATION: 4 weeks   PLANNED INTERVENTIONS: 97164- PT Re-evaluation, 97110-Therapeutic exercises, 97530- Therapeutic activity, 97112- Neuromuscular re-education, 97535- Self Care, [id number]- Manual therapy, [phone]- Gait training, [phone]- Canalith repositioning, [phone]- Aquatic Therapy, Patient/Family education, Balance training, Stair training, Taping, Dry Needling, Joint mobilization, Spinal mobilization, Vestibular training, DME instructions, Cryotherapy, and Moist heat  PLAN FOR NEXT SESSION: Cont general strength like nustep, Rt LE general but hip focus, and general bil UE (no sig weakness here), Farmers carries with 5 lb  KB to simulate walking with golf bag  Berwyn Knights, PTA 08/25/24 1:07 PM

## 2024-08-27 NOTE — Pre-Procedure Instructions (Signed)
 Attempted to call patient regarding procedure instructions for Friday 12/5.  Left voiceamil on the following items: Arrival time 0730 Nothing to eat or drink after midnight No meds AM of procedure Responsible person to drive you home and stay with you for 24 hrs Wash with special soap night before and morning of procedure If on anti-coagulant drug instructions Eliquis - last dose 12/1

## 2024-08-29 ENCOUNTER — Ambulatory Visit (HOSPITAL_COMMUNITY): Admission: RE | Disposition: A | Payer: Self-pay | Source: Home / Self Care | Attending: Internal Medicine

## 2024-08-29 ENCOUNTER — Other Ambulatory Visit: Payer: Self-pay

## 2024-08-29 ENCOUNTER — Ambulatory Visit (HOSPITAL_COMMUNITY)
Admission: RE | Admit: 2024-08-29 | Discharge: 2024-08-29 | Disposition: A | Attending: Internal Medicine | Admitting: Internal Medicine

## 2024-08-29 DIAGNOSIS — E1122 Type 2 diabetes mellitus with diabetic chronic kidney disease: Secondary | ICD-10-CM | POA: Diagnosis not present

## 2024-08-29 DIAGNOSIS — I447 Left bundle-branch block, unspecified: Secondary | ICD-10-CM | POA: Diagnosis not present

## 2024-08-29 DIAGNOSIS — I4901 Ventricular fibrillation: Secondary | ICD-10-CM | POA: Insufficient documentation

## 2024-08-29 DIAGNOSIS — I4819 Other persistent atrial fibrillation: Secondary | ICD-10-CM | POA: Insufficient documentation

## 2024-08-29 DIAGNOSIS — I130 Hypertensive heart and chronic kidney disease with heart failure and stage 1 through stage 4 chronic kidney disease, or unspecified chronic kidney disease: Secondary | ICD-10-CM | POA: Diagnosis not present

## 2024-08-29 DIAGNOSIS — I5042 Chronic combined systolic (congestive) and diastolic (congestive) heart failure: Secondary | ICD-10-CM | POA: Insufficient documentation

## 2024-08-29 DIAGNOSIS — Z79899 Other long term (current) drug therapy: Secondary | ICD-10-CM | POA: Insufficient documentation

## 2024-08-29 DIAGNOSIS — Z7722 Contact with and (suspected) exposure to environmental tobacco smoke (acute) (chronic): Secondary | ICD-10-CM | POA: Diagnosis not present

## 2024-08-29 DIAGNOSIS — I428 Other cardiomyopathies: Secondary | ICD-10-CM | POA: Insufficient documentation

## 2024-08-29 DIAGNOSIS — Z4502 Encounter for adjustment and management of automatic implantable cardiac defibrillator: Secondary | ICD-10-CM | POA: Insufficient documentation

## 2024-08-29 DIAGNOSIS — N184 Chronic kidney disease, stage 4 (severe): Secondary | ICD-10-CM | POA: Diagnosis not present

## 2024-08-29 DIAGNOSIS — C9000 Multiple myeloma not having achieved remission: Secondary | ICD-10-CM | POA: Diagnosis not present

## 2024-08-29 DIAGNOSIS — Z7901 Long term (current) use of anticoagulants: Secondary | ICD-10-CM | POA: Diagnosis not present

## 2024-08-29 HISTORY — PX: BIV ICD GENERATOR CHANGEOUT: EP1194

## 2024-08-29 SURGERY — BIV ICD GENERATOR CHANGEOUT

## 2024-08-29 MED ORDER — CEFAZOLIN SODIUM-DEXTROSE 2-3 GM-%(50ML) IV SOLR
INTRAVENOUS | Status: DC | PRN
  Administered 2024-08-29: 2 g via INTRAVENOUS

## 2024-08-29 MED ORDER — POVIDONE-IODINE 10 % EX SWAB
2.0000 | Freq: Once | CUTANEOUS | Status: AC
  Administered 2024-08-29: 2 via TOPICAL

## 2024-08-29 MED ORDER — FENTANYL CITRATE (PF) 100 MCG/2ML IJ SOLN
INTRAMUSCULAR | Status: DC | PRN
  Administered 2024-08-29: 25 ug via INTRAVENOUS
  Administered 2024-08-29: 12.5 ug via INTRAVENOUS

## 2024-08-29 MED ORDER — ONDANSETRON HCL 4 MG/2ML IJ SOLN: 4.0000 mg | Freq: Four times a day (QID) | INTRAMUSCULAR | Status: DC | PRN

## 2024-08-29 MED ORDER — CEFAZOLIN SODIUM-DEXTROSE 2-4 GM/100ML-% IV SOLN
INTRAVENOUS | Status: DC
  Filled 2024-08-29: qty 100

## 2024-08-29 MED ORDER — MIDAZOLAM HCL 2 MG/2ML IJ SOLN
INTRAMUSCULAR | Status: AC
  Filled 2024-08-29: qty 2

## 2024-08-29 MED ORDER — SODIUM CHLORIDE 0.9 % IV SOLN: INTRAVENOUS | Status: DC

## 2024-08-29 MED ORDER — LIDOCAINE HCL (PF) 1 % IJ SOLN
INTRAMUSCULAR | Status: DC | PRN
  Administered 2024-08-29: 30 mL

## 2024-08-29 MED ORDER — ACETAMINOPHEN 325 MG PO TABS: 325.0000 mg | ORAL_TABLET | ORAL | Status: DC | PRN

## 2024-08-29 MED ORDER — SODIUM CHLORIDE 0.9 % IV SOLN
INTRAVENOUS | Status: DC
  Filled 2024-08-29: qty 2

## 2024-08-29 MED ORDER — CEFAZOLIN SODIUM-DEXTROSE 2-4 GM/100ML-% IV SOLN: 2.0000 g | INTRAVENOUS | Status: DC

## 2024-08-29 MED ORDER — CHLORHEXIDINE GLUCONATE 4 % EX SOLN: 4.0000 | Freq: Once | CUTANEOUS | Status: DC

## 2024-08-29 MED ORDER — MIDAZOLAM HCL 5 MG/5ML IJ SOLN
INTRAMUSCULAR | Status: DC | PRN
  Administered 2024-08-29: 2 mg via INTRAVENOUS
  Administered 2024-08-29: 1 mg via INTRAVENOUS

## 2024-08-29 MED ORDER — FENTANYL CITRATE (PF) 100 MCG/2ML IJ SOLN
INTRAMUSCULAR | Status: AC
  Filled 2024-08-29: qty 2

## 2024-08-29 MED ORDER — LIDOCAINE HCL (PF) 1 % IJ SOLN
INTRAMUSCULAR | Status: AC
  Filled 2024-08-29: qty 60

## 2024-08-29 MED ORDER — SODIUM CHLORIDE 0.9 % IV SOLN
80.0000 mg | INTRAVENOUS | Status: AC
  Administered 2024-08-29: 80 mg

## 2024-08-29 SURGICAL SUPPLY — 6 items
CABLE SURGICAL S-101-97-12 (CABLE) ×1 IMPLANT
DEVICE CRT GALLANT HF DF1 (ICD Generator) IMPLANT
PAD DEFIB RADIO PHYSIO CONN (PAD) ×1 IMPLANT
PIN PLUG IS-1 DEFIB (PIN) IMPLANT
POUCH AIGIS-R ANTIBACT ICD LRG (Mesh General) IMPLANT
TRAY PACEMAKER INSERTION (PACKS) ×1 IMPLANT

## 2024-08-29 NOTE — Interval H&P Note (Signed)
 History and Physical Interval Note:  08/29/2024 8:33 AM  Eric [name]., MD  has presented today for surgery, with the diagnosis of eri.  The various methods of treatment have been discussed with the patient and family. After consideration of risks, benefits and other options for treatment, the patient has consented to  Procedure(s): BIV ICD GENERATOR CHANGEOUT (N/A) as a surgical intervention.  The patient's history has been reviewed, patient examined, no change in status, stable for surgery.  I have reviewed the patient's chart and labs.  Questions were answered to the patient's satisfaction.     Danelle Birmingham

## 2024-08-29 NOTE — Discharge Instructions (Signed)

## 2024-08-29 NOTE — Patient Instructions (Signed)

## 2024-08-31 ENCOUNTER — Encounter (HOSPITAL_COMMUNITY): Payer: Self-pay | Admitting: Internal Medicine

## 2024-09-01 ENCOUNTER — Encounter: Payer: Self-pay | Admitting: Internal Medicine

## 2024-09-01 ENCOUNTER — Ambulatory Visit: Payer: Self-pay | Admitting: Internal Medicine

## 2024-09-01 ENCOUNTER — Ambulatory Visit: Admitting: Rehabilitation

## 2024-09-01 ENCOUNTER — Encounter: Payer: Self-pay | Admitting: Rehabilitation

## 2024-09-01 VITALS — BP 120/78 | HR 77 | Temp 97.9°F | Ht 70.0 in | Wt 202.8 lb

## 2024-09-01 DIAGNOSIS — Q2543 Congenital aneurysm of aorta: Secondary | ICD-10-CM | POA: Diagnosis not present

## 2024-09-01 DIAGNOSIS — I480 Paroxysmal atrial fibrillation: Secondary | ICD-10-CM

## 2024-09-01 DIAGNOSIS — E032 Hypothyroidism due to medicaments and other exogenous substances: Secondary | ICD-10-CM

## 2024-09-01 DIAGNOSIS — E1122 Type 2 diabetes mellitus with diabetic chronic kidney disease: Secondary | ICD-10-CM

## 2024-09-01 DIAGNOSIS — M1A3790 Chronic gout due to renal impairment, unspecified ankle and foot, without tophus (tophi): Secondary | ICD-10-CM

## 2024-09-01 DIAGNOSIS — D649 Anemia, unspecified: Secondary | ICD-10-CM | POA: Diagnosis not present

## 2024-09-01 DIAGNOSIS — R2681 Unsteadiness on feet: Secondary | ICD-10-CM

## 2024-09-01 DIAGNOSIS — N1832 Chronic kidney disease, stage 3b: Secondary | ICD-10-CM | POA: Diagnosis not present

## 2024-09-01 DIAGNOSIS — M6281 Muscle weakness (generalized): Secondary | ICD-10-CM

## 2024-09-01 DIAGNOSIS — K429 Umbilical hernia without obstruction or gangrene: Secondary | ICD-10-CM | POA: Diagnosis not present

## 2024-09-01 DIAGNOSIS — I129 Hypertensive chronic kidney disease with stage 1 through stage 4 chronic kidney disease, or unspecified chronic kidney disease: Secondary | ICD-10-CM | POA: Diagnosis not present

## 2024-09-01 DIAGNOSIS — I5042 Chronic combined systolic (congestive) and diastolic (congestive) heart failure: Secondary | ICD-10-CM

## 2024-09-01 DIAGNOSIS — I130 Hypertensive heart and chronic kidney disease with heart failure and stage 1 through stage 4 chronic kidney disease, or unspecified chronic kidney disease: Secondary | ICD-10-CM | POA: Diagnosis not present

## 2024-09-01 DIAGNOSIS — R2689 Other abnormalities of gait and mobility: Secondary | ICD-10-CM

## 2024-09-01 MED ORDER — LEVOTHYROXINE SODIUM 75 MCG PO TABS: 75.0000 ug | ORAL_TABLET | Freq: Every day | ORAL | 2 refills | Status: AC

## 2024-09-01 MED ORDER — ATORVASTATIN CALCIUM 40 MG PO TABS: 40.0000 mg | ORAL_TABLET | Freq: Every day | ORAL | 2 refills | Status: AC

## 2024-09-01 MED ORDER — ALLOPURINOL 100 MG PO TABS: 100.0000 mg | ORAL_TABLET | Freq: Every day | ORAL | 2 refills | Status: AC

## 2024-09-01 MED ORDER — APIXABAN 5 MG PO TABS: 5.0000 mg | ORAL_TABLET | Freq: Two times a day (BID) | ORAL | 1 refills | Status: AC

## 2024-09-01 MED ORDER — CARVEDILOL 6.25 MG PO TABS: 6.2500 mg | ORAL_TABLET | Freq: Two times a day (BID) | ORAL | 2 refills | Status: AC

## 2024-09-01 MED ORDER — TEMAZEPAM 15 MG PO CAPS: 15.0000 mg | ORAL_CAPSULE | Freq: Every evening | ORAL | 2 refills | Status: AC | PRN

## 2024-09-01 MED FILL — Cefazolin Sodium-Dextrose IV Solution 2 GM/100ML-4%: INTRAVENOUS | Qty: 100 | Status: AC

## 2024-09-01 MED FILL — Midazolam HCl Inj 2 MG/2ML (Base Equivalent): INTRAMUSCULAR | Qty: 3 | Status: AC

## 2024-09-01 NOTE — Therapy (Signed)
 OUTPATIENT PHYSICAL THERAPY TREATMENT   Patient Name: Eric Butcher., MD MRN: [id number] DOB:1951/03/29, 73 y.o., male Today's Date: 09/01/2024   PCP:  Catheryn Slocumb, MD  REFERRING PROVIDER: Catheryn Slocumb, MD   END OF SESSION:  PT End of Session - 09/01/24 1013     Visit Number 11    Number of Visits 17    Date for Recertification  09/08/24    PT Start Time 1015   late   PT Stop Time 1056    PT Time Calculation (min) 41 min    Activity Tolerance Patient tolerated treatment well    Behavior During Therapy [facility] for tasks assessed/performed                  Past Medical History:  Diagnosis Date   Acute blood loss anemia    Acute encephalopathy    Acute hypoxemic respiratory failure (HCC)    Acute idiopathic gout of right ankle    Acute lumbar back pain    Acute on chronic systolic heart failure (HCC)    Acute respiratory failure (HCC)    AKI (acute kidney injury)    Altered mental status    Aortic aneurysm without rupture 02/09/2016   Aortic root enlargement 02/13/2012   Aortic valve regurgitation, acquired 02/13/2012   Arrhythmia    Atrial flutter (HCC) 03/18/2012   Back pain    Cardiac arrest (HCC) 02/01/2016   CHF (congestive heart failure) (HCC)    Chronic anticoagulation 03/04/2013   Chronic combined systolic and diastolic heart failure (HCC)    Chronic kidney disease    kidney fx studies increased    Chronic lower back pain    Chronic renal insufficiency    Chronic systolic heart failure (HCC) 02/13/2012   Recent diagnosis 4 / 2013, LVEF 25% by Echo 12/2011  03/2013: Echo at [facility] Conclusions: 1. Left ventricular ejection fraction estimated by 2D at 40-45 percent. 2. Mild concentric left ventricular hypertrophy. 3. Mild left atrial enlargement. 4. Moderate aortic valve regurgitation. 5. The aortic root at the sinus(es) of valsalva is moderately dilated 6. Mild mitral valve regurgitation. 7.   CKD (chronic kidney disease)    CKD (chronic  kidney disease), stage IV (HCC) 08/06/2013   Creatinine 2.4 on 07/04/13    Claustrophobia    Colon cancer screening 03/04/2013   Debility 02/22/2016   Diabetes mellitus type 2 in nonobese [facility])    Diabetes mellitus type 2 in obese    Dysrhythmia    palpitations   Encounter for central line placement    Exertional dyspnea 01/2012   Femoral nerve injury 02/22/2016   Femoral neuropathy    Goals of care, counseling/discussion 05/05/2020   HCAP (healthcare-associated pneumonia)    Heart murmur    Hyperlipidemia 02/13/2012   Hypertension    Hypothyroidism    Internal hemorrhoids without mention of complication 04/11/2013   Labile blood pressure    Left bundle branch block 02/13/2012   Leg weakness, bilateral    Long term (current) use of anticoagulants 08/06/2013   Eliquis  therapy    Long term current use of amiodarone  08/07/2016   Lower extremity weakness    Migraine 02/13/2012   opthalmic   Multiple myeloma (HCC) 05/05/2020   Non-traumatic rhabdomyolysis    Obesity (BMI 30-39.9) 02/13/2012   Pain    Paroxysmal atrial fibrillation (HCC)    Pneumonia    Retroperitoneal bleed    Right ankle pain    Right knee pain    Severe  aortic regurgitation 02/09/2016   Special screening for malignant neoplasms, colon 04/11/2013   Thyroid  activity decreased    Tibial pain    Varicose vein of leg    right   Ventricular fibrillation (HCC) 02/09/2016   Weakness of both lower extremities    Past Surgical History:  Procedure Laterality Date   AORTIC VALVE REPLACEMENT  11/20/2018   BIV ICD GENERATOR CHANGEOUT N/A 08/29/2024   Procedure: BIV ICD GENERATOR CHANGEOUT;  Surgeon: Waddell Danelle ORN, MD;  Location: [facility] INVASIVE CV LAB;  Service: Cardiovascular;  Laterality: N/A;   CARDIAC CATHETERIZATION N/A 02/17/2016   Procedure: Left Heart Cath and Coronary Angiography;  Surgeon: Candyce GORMAN Reek, MD;  Location: [facility] INVASIVE CV LAB;  Service: Cardiovascular;  Laterality: N/A;   CARDIOVERSION   03/22/2012   Procedure: CARDIOVERSION;  Surgeon: Oneil Parchment, MD;  Location: [facility] ENDOSCOPY;  Service: Cardiovascular;  Laterality: N/A;   CARDIOVERSION  04/19/2012   Procedure: CARDIOVERSION;  Surgeon: Victory ORN Claudene DOUGLAS, MD;  Location: [facility] OR;  Service: Cardiovascular;  Laterality: N/A;   COLONOSCOPY N/A 04/11/2013   Procedure: COLONOSCOPY;  Surgeon: Lamar JONETTA Aho, MD;  Location: [facility] ENDOSCOPY;  Service: Endoscopy;  Laterality: N/A;   COLONOSCOPY N/A 04/11/2013   Procedure: COLONOSCOPY;  Surgeon: Lamar JONETTA Aho, MD;  Location: [facility] ENDOSCOPY;  Service: Endoscopy;  Laterality: N/A;   EP IMPLANTABLE DEVICE N/A 02/17/2016   Procedure: BiV ICD Insertion CRT-D;  Surgeon: Danelle ORN Waddell, MD;  Location: [facility] INVASIVE CV LAB;  Service: Cardiovascular;  Laterality: N/A;   FINGER SURGERY  2012   4th digit right hand; thumb on left hand   RADIOLOGY WITH ANESTHESIA N/A 02/11/2016   Procedure: MRI OF THE BRAIN WITHOUT CONTRAST, LUMBAR WITHOUT CONTRAST;  Surgeon: Medication Radiologist, MD;  Location: [facility] OR;  Service: Radiology;  Laterality: N/A;  DR. WOOD/MRI   RIGHT/LEFT HEART CATH AND CORONARY ANGIOGRAPHY N/A 07/26/2018   Procedure: RIGHT/LEFT HEART CATH AND CORONARY ANGIOGRAPHY;  Surgeon: Claudene Victory ORN, MD;  Location: [facility] INVASIVE CV LAB;  Service: Cardiovascular;  Laterality: N/A;   Skin melanocytoma excision  2012   above left clavicle   STERNAL INCISION RECLOSURE  11/2018   STERNAL WIRE REMOVAL  11/2018   STERNAL WOUND DEBRIDEMENT  11/2018   TEE WITHOUT CARDIOVERSION  03/22/2012   Procedure: TRANSESOPHAGEAL ECHOCARDIOGRAM (TEE);  Surgeon: Oneil Parchment, MD;  Location: [facility] ENDOSCOPY;  Service: Cardiovascular;  Laterality: N/A;   TEE WITHOUT CARDIOVERSION N/A 02/08/2016   Procedure: TRANSESOPHAGEAL ECHOCARDIOGRAM (TEE);  Surgeon: Redell GORMAN Shallow, MD;  Location: [facility] ENDOSCOPY;  Service: Cardiovascular;  Laterality: N/A;   Patient Active Problem List   Diagnosis Date Noted   Umbilical hernia without  obstruction and without gangrene 05/11/2024   Chalazion of right upper eyelid 05/11/2024   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 05/11/2024   Venous stasis 05/11/2024   Adjustment insomnia 01/01/2024   Presence of heart assist device (HCC) 12/31/2023   Enlarged prostate 09/10/2023   Dyslipidemia due to type 2 diabetes mellitus (HCC) 09/10/2023   Acquired thrombophilia 05/14/2023   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   Goals of care, counseling/discussion 05/05/2020   Multiple myeloma (HCC) 05/05/2020   S/P flap graft 01/07/2019   Coronary artery disease 01/07/2019   Long term (current) use of antibiotics 01/03/2019   Essential hypertension 12/27/2018   Postoperative infection of wound of sternum 12/27/2018   Decreased strength, endurance, and mobility 12/27/2018   Cardiac LV ejection fraction 30-35% 12/27/2018   Decreased  activities of daily living (ADL) 12/27/2018   Klebsiella infection 12/27/2018   S/P AVR (aortic valve replacement) 11/23/2018   S/P ascending aortic aneurysm repair 11/23/2018   NICM (nonischemic cardiomyopathy) (HCC) 11/19/2018   AICD (automatic cardioverter/defibrillator) present 11/19/2018   Other symptoms and signs involving the musculoskeletal system 05/08/2018   Long term current use of amiodarone  08/07/2016   Type 2 diabetes mellitus with stage 3b chronic kidney disease, without long-term current use of insulin  (HCC)    Femoral neuropathy    Leg weakness, bilateral    PAF (paroxysmal atrial fibrillation) (HCC)    Thyroid  activity decreased    Arrhythmia    Long term current use of anticoagulant 03/04/2013   Atrial flutter (HCC) 03/18/2012    Class: Acute   Chronic combined systolic and diastolic CHF (congestive heart failure) (HCC) 02/13/2012    Class: Acute   Hypertension, accelerated 02/13/2012   Hyperlipidemia 02/13/2012   Left bundle branch block 02/13/2012   Obesity (BMI 30-39.9) 02/13/2012    ONSET  DATE:   REFERRING DIAG:  Diagnosis R53.1,Z74.09,R68.89 (ICD-10-CM) - Decreased strength, endurance, and mobility   THERAPY DIAG:  Unsteadiness on feet  Muscle weakness (generalized)  Other abnormalities of gait and mobility  Rationale for Evaluation and Treatment: Rehabilitation  SUBJECTIVE:                                                                                                                                                                                             SUBJECTIVE STATEMENT:  I just had my pacemaker replaced. I'm not supposed to put my arm overhead.    Eval: I have peripheral neuropathy in the Rt leg.  I am also noticing some weakness in both of the arms maybe from taking the ozempic .  I may want to do more aquatic therapy.  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  I have tingling on the inner groin, knee and front of the leg.  Nothing really in the foot.   I am playing 9 holes of golf lately.  I walk at home.  I go back to the [employer] to ride the bike.    PERTINENT HISTORY: , atrial fibrillation, cardiac arrest s/p ICD, CAD, severe aortic regurgitation s/p AVR 10/2018 and aortic root replacement with CABGx1, aortic dilation, hypertension, hypothyroidism, type 2 diabetes, multiple myeloma, CKD IIIb-IV, migraine  PAIN:  Are you having pain? No, just tightness in bilateral quadriceps  PRECAUTIONS: Fall and ICD/Pacemaker  RED FLAGS: None   WEIGHT BEARING RESTRICTIONS: No  FALLS: Has patient fallen in last 6 months? No  LIVING ENVIRONMENT:  Lives with: alone  Lives in: House/apartment Stairs: 4 steps to enter; 3 story home Has following equipment at home: Shower bench, and raised commode  PLOF: Independent;  working part time as Ophthalmologist- requires sitting, standing, walking   PATIENT GOALS: improve R LE strength and bil UE strength   OBJECTIVE:  Note: Objective measures were completed at Evaluation  unless otherwise noted.  DIAGNOSTIC FINDINGS: none recent  COGNITION: Overall cognitive status: Within functional limits for tasks assessed   SENSATION: Intact in B LEs to light touch   POSTURE: rounded shoulders and forward head  LOWER EXTREMITY ROM:     Active  Right Eval Left Eval  Hip flexion    Hip extension    Hip abduction    Hip adduction    Hip internal rotation    Hip external rotation    Knee flexion    Knee extension    Ankle dorsiflexion    Ankle plantarflexion    Ankle inversion    Ankle eversion     (Blank rows = not tested)  LOWER EXTREMITY MMT:    MMT Right Eval RIGHT  08/11/24 Left Eval LEFT 08/11/24  Hip flexion 3+ 4- 4 4+  Hip extension      Hip abduction 4+  4+   Hip adduction 4  4   Hip internal rotation 4+  4+   Hip external rotation 4-  4   Knee flexion 5 5 5 5   Knee extension 4- 5 4+ 5  Ankle dorsiflexion 5  5   Ankle plantarflexion 4+ 4+ 4+ 4+  Ankle inversion      Ankle eversion      (Blank rows = not tested)  UPPER EXTREMITY MMT:  MMT Right eval Left eval  Shoulder flexion 4 4  Shoulder extension    Shoulder abduction 4 4  Shoulder adduction    Shoulder extension    Shoulder internal rotation 5 5  Shoulder external rotation 5 5  Middle trapezius    Lower trapezius    Elbow flexion 4+ 5  Elbow extension 4+ 4+  Wrist flexion    Wrist extension    Wrist ulnar deviation    Wrist radial deviation    Wrist pronation    Wrist supination    Grip strength 58 40   (Blank rows = not tested)  GAIT: Gait pattern: Significant R-ward trunk lean with gait; slightly slowed gait speed  Assistive device utilized: None Level of assistance: Modified independence  FUNCTIONAL TESTS:  5x sit to stand:   walk: able to walk 558ft stopping due to the Rt leg feeling tight.  SOB level 4/10  08/11/24: 6 min walk test - 704.7 ft with break after 3 minutes (437 ft due to SOB and Rt leg feeling tight), SOB level 3/10    06/25/24 SL stance:  Rt: unable to perform without UE support; with  single arm support able to hold for 20 Lt: 3 seconds without UE support; 20 with single arm UE support   Tandem stance:  Rt: 20 with single arm UE support  Lt: 30 without UE support     TODAY'S TREATMENT:   09/01/24 Therapeutic Exercises NuStep LE 9 level 4 x 3 min, then level 3 x 5 min for a total of 7 min - PT present to discuss current functional status Seated bil HS stretch (during rest break of hip 3 way raises) x 2 reps, x 20 sec holds Seated bil piriformis figure 4 stretch 2  reps, x 20 sec each returning therapist demo Standing with foot in chair behind him for quad stretch and holding // bars x 2 reps, 20 sec holds each Neuromuscular Re Ed In // bars with 3# added to each ankle for hip 3 way raises (flex, abd, seated rest, and then ext) x 10 each with SLS on blue oval pad for stability except for ext was done on level surface as pt was reporting increased hip fatigue Hip hiking standing on 4 step x 10 each returning therapist demo and tactile cues for correct technique, used mirror for feedback Therapeutic Activities Front 4 step ups with 3 SLS on step with each rep, 2 x 5 Toe taps on 4 step today (decreased step due to LE fatigue from new exs) x 10 each Seated rest after and then leg press at end of session  Calf raise x 5  No leg press due to fatigued legs   08/25/24: Therapeutic Exercises NuStep UE 12/ LE 9 level 4 x 2 min, then level 3 x 5 min for a total of 7 min - PTA present to discuss current functional status since last appt Seated bil HS stretch (during rest break of hip 3 way raises) x 2 reps, x 20 sec holds, VC's for erect posture for most beneficial stretch Seated bil piriformis figure 4 stretch 2 reps, x 20 sec each returning therapist demo Standing with foot in chair behind him for quad stretch and holding // bars x 2 reps, 20 sec holds each Neuromuscular Re Ed In // bars with 3#  added to each ankle for hip 3 way raises (flex, abd, seated rest, and then ext) x 10 each with SLS on blue oval pad for stability except for ext was done on level surface as pt was reporting increased hip fatigue Hip hiking standing on 4 step x 10 each returning therapist demo and tactile cues for correct technique, very challenging on Rt > Lt side (Seated rest with above piriformis stretch) Therapeutic Activities Front 4 step ups with 3 SLS on step with each rep, 2 x 5 with tightness reported in Rt quad during Rt SLS (Standing above quad stretch), then seated rest after Toe taps on 4 step today (decreased step due to LE fatigue from new exs) x 10 each Seated rest after and then leg press at end of session  Leg Press 60# x 10, this was challenging for pt  08/15/24:Pt arrives for aquatic physical therapy. Treatment took place in 3.5-5.5 feet of water. Water temperature was 91 degrees F. Pt entered the pool via stairs independently. Seated water bench with 75% submersion Pt performed seated LE AROM exercises 20x in all planes, concurrent review of status to prepare for todays session.  -75% water depth for walking 3 ways with rainbow UE floats for push/pull: 6 lengths of each -Suitcase carry; 1 yellow float by his side for 2 lengths, then change sides for 2 lengths -Hip 3 ways 15x Bil holding onto the wall. Vc for control -high knee marching holding rainbow floats 4 lengths -Side steps holding 1 rail 10x Bil -Horseback bicycle on yellow noodle in the corner 3 min   PATIENT EDUCATION: Education details: per today's note Person educated: Patient Education method: Programmer, Multimedia, Demonstration, Tactile cues, Verbal cues, and Handouts Education comprehension: verbalized understanding and returned demonstration  HOME EXERCISE PROGRAM: Access Code: [email or web address] URL: [email or web address] Date: 06/23/2024 Prepared by: Saddie Raw  Exercises - Standing Hip Abduction with Counter  Support  - 1 x  daily - 5 x weekly - 2 sets - 10 reps - Side Stepping with Resistance at Ankles and Counter Support  - 1 x daily - 5 x weekly - 1 sets - 3-5 reps - Standing Hip Extension with Counter Support  - 1 x daily - 5 x weekly - 1-3 sets - 10 reps - 20-30 seconds hold - Seated Long Arc Quad with Ankle Weight  - 1 x daily - 5 x weekly - 2 sets - 10 reps - Sit to Stand  - 1 x daily - 5 x weekly - 2 sets - 10 reps - Seated Hamstring Stretch  - 1 x daily - 7 x weekly - 1-3 sets - 3 reps - 20-30 seconds hold  GOALS: Goals reviewed with patient? Yes  SHORT TERM GOALS: Target date: 06/23/24/2024  Patient to be independent with initial HEP. Baseline: HEP initiated Goal status: MET from previous PT sessions    LONG TERM GOALS: Target date: 09/08/24  Patient to be independent with advanced HEP. Baseline: Not yet initiated  Goal status: In progress  Patient to demonstrate Rt LE strength >/=4/5.  Baseline: See above Goal status: In Progress  -    Pt will improve his walk test to being able to walk the full without rest and reaching at least 1090ft  Baseline: 530ft   Goal Status: In progress    ASSESSMENT:  CLINICAL IMPRESSION: Continued POC today.  Pt did not require many cues for hip hiking today.  He continues with fatigue after treatment.     OBJECTIVE IMPAIRMENTS: Abnormal gait, decreased balance, difficulty walking, decreased ROM, decreased strength, impaired flexibility, impaired sensation, postural dysfunction, and pain.   ACTIVITY LIMITATIONS: carrying, lifting, bending, standing, squatting, stairs, transfers, bathing, toileting, dressing, locomotion level, and caring for others  PARTICIPATION LIMITATIONS: meal prep, cleaning, laundry, shopping, community activity, occupation, yard work, and church  PERSONAL FACTORS: Age, Past/current experiences, Time since onset of injury/illness/exacerbation, and 3+ comorbidities: HFrEF, atrial fibrillation, VF cardiac arrest  s/p ICD, CAD, severe aortic regurgitation s/p AVR 10/2018 and aortic root replacement with CABGx1, aortic dilation, hypertension, hypothyroidism, type 2 diabetes, multiple myeloma, CKD IIIb-IV, migraine are also affecting patient's functional outcome.   REHAB POTENTIAL: Good  CLINICAL DECISION MAKING: LOW  EVALUATION COMPLEXITY: LOW  PLAN:  PT FREQUENCY: 1-2x/week  PT DURATION: 4 weeks   PLANNED INTERVENTIONS: [id number]- PT Re-evaluation, 97110-Therapeutic exercises, 97530- Therapeutic activity, 97112- Neuromuscular re-education, 97535- Self Care, [id number]- Manual therapy, [phone]- Gait training, [phone]- Canalith repositioning, [phone]- Aquatic Therapy, Patient/Family education, Balance training, Stair training, Taping, Dry Needling, Joint mobilization, Spinal mobilization, Vestibular training, DME instructions, Cryotherapy, and Moist heat  PLAN FOR NEXT SESSION: Cont general strength like nustep, Rt LE general but hip focus, and general bil UE (no sig weakness here), Farmers carries with 5 lb KB to simulate walking with golf bag  Berwyn Knights, PTA 09/01/24 10:57 AM

## 2024-09-01 NOTE — Progress Notes (Signed)
 I,Victoria T Emmitt, CMA,acting as a neurosurgeon for Catheryn LOISE Slocumb, MD.,have documented all relevant documentation on the behalf of Catheryn LOISE Slocumb, MD,as directed by  Catheryn LOISE Slocumb, MD while in the presence of Catheryn LOISE Slocumb, MD.  Subjective:  Patient ID: Eric Cyrus Raddle., MD , male    DOB: 12-Nov-1950 , 73 y.o.   MRN: [id number]  Chief Complaint  Patient presents with   Diabetes    Patient being seen today for diabetes f/u. Patient has been compliant with all medications. Patient has no SOB, headaches or chest pain at this time. Patient has no complaints or concerns.   Hypertension    HPI Discussed the use of AI scribe software for clinical note transcription with the patient, who gave verbal consent to proceed.  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 He is being followed for diabetes and high blood pressure.   He recently underwent a pacemaker battery replacement in a cath lab without complications. He was taken off Eliquis  three days prior to the procedure and is scheduled to restart it on the tenth. He has not been monitoring his blood sugars recently.  His current medications include Ozempic , allopurinol , amiodarone , Eliquis , rocaltrol (calcitriol), [place], Farxiga , Flonase  as needed, Lasix  80 mg daily (increased on non-working days due to edema), levothyroxine  75 mcg, and temazepam  for sleep. Temazepam  is effective unless he takes Lasix  late in the day, which causes nocturia.  He experiences increased edema in his legs on non-working days and takes two Lasix  pills on those days. He uses compression socks that are not true compression socks.  He has a history of aortic root aneurysm, which is monitored with CT scans due to concerns about MRI compatibility with his pacemaker. The last CT scan showed a slight increase in size from 38x36 mm to 40x38 mm.  He has chronic kidney disease, stage  3B, with increasing creatinine levels, prompting more frequent follow-ups with his nephrologist. He takes allopurinol  daily due to elevated uric acid levels.  He has a history of lumbar spine issues, with an MRI done in 2017, possibly before his pacemaker was placed.   Hypertension This is a chronic problem. The current episode started more than 1 year ago. The problem has been gradually improving since onset. The problem is controlled. Pertinent negatives include no blurred vision, chest pain, palpitations or shortness of breath. Risk factors for coronary artery disease include diabetes mellitus, dyslipidemia, sedentary lifestyle, obesity, male gender and stress. Past treatments include beta blockers and diuretics. The current treatment provides moderate improvement. Hypertensive end-organ damage includes kidney disease. There is no history of CVA.  Diabetes He presents for his follow-up diabetic visit. He has type 2 diabetes mellitus. There are no hypoglycemic associated symptoms. Pertinent negatives for diabetes include no blurred vision, no chest pain, no polydipsia, no polyphagia and no polyuria. There are no hypoglycemic complications. There are no diabetic complications. Pertinent negatives for diabetic complications include no CVA. Risk factors for coronary artery disease include obesity and sedentary lifestyle. Current diabetic treatment includes oral agent (dual therapy). He is following a diabetic diet. Meal planning includes avoidance of concentrated sweets. He participates in exercise intermittently. His breakfast blood glucose is taken between 8-9 am. His breakfast blood glucose range is generally 110-130 mg/dl. An ACE inhibitor/angiotensin II receptor blocker is not being taken. He sees a podiatrist.Eye exam is not current.  Hyperlipidemia This is a chronic  problem. The current episode started more than 1 year ago. Exacerbating diseases include diabetes and hypothyroidism. He has no history  of obesity. Pertinent negatives include no chest pain or shortness of breath. Current antihyperlipidemic treatment includes statins.     Past Medical History:  Diagnosis Date   Acute blood loss anemia    Acute encephalopathy    Acute hypoxemic respiratory failure (HCC)    Acute idiopathic gout of right ankle    Acute lumbar back pain    Acute on chronic systolic heart failure (HCC)    Acute respiratory failure (HCC)    AKI (acute kidney injury)    Altered mental status    Aortic aneurysm without rupture 02/09/2016   Aortic root enlargement 02/13/2012   Aortic valve regurgitation, acquired 02/13/2012   Arrhythmia    Atrial flutter (HCC) 03/18/2012   Back pain    Cardiac arrest (HCC) 02/01/2016   CHF (congestive heart failure) (HCC)    Chronic anticoagulation 03/04/2013   Chronic combined systolic and diastolic heart failure (HCC)    Chronic kidney disease    kidney fx studies increased    Chronic lower back pain    Chronic renal insufficiency    Chronic systolic heart failure (HCC) 02/13/2012   Recent diagnosis 4 / 2013, LVEF 25% by Echo 12/2011  03/2013: Echo at [facility] Conclusions: 1. Left ventricular ejection fraction estimated by 2D at 40-45 percent. 2. Mild concentric left ventricular hypertrophy. 3. Mild left atrial enlargement. 4. Moderate aortic valve regurgitation. 5. The aortic root at the sinus(es) of valsalva is moderately dilated 6. Mild mitral valve regurgitation. 7.   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    Goals of care, counseling/discussion 05/05/2020   HCAP  (healthcare-associated pneumonia)    Heart murmur    Hyperlipidemia 02/13/2012   Hypertension    Hypothyroidism    Internal hemorrhoids without mention of complication 04/11/2013   Labile blood pressure    Left bundle branch block 02/13/2012   Leg weakness, bilateral    Long term (current) use of anticoagulants 08/06/2013   Eliquis  therapy    Long term current use of amiodarone  08/07/2016   Lower extremity weakness    Migraine 02/13/2012   opthalmic   Multiple myeloma (HCC) 05/05/2020   Non-traumatic rhabdomyolysis    Obesity (BMI 30-39.9) 02/13/2012   Pain    Paroxysmal atrial fibrillation (HCC)    Pneumonia    Retroperitoneal bleed    Right ankle pain    Right knee pain    Severe aortic regurgitation 02/09/2016   Special screening for malignant neoplasms, colon 04/11/2013   Thyroid  activity decreased    Tibial pain    Varicose vein of leg    right   Ventricular fibrillation (HCC) 02/09/2016   Weakness of both lower extremities      Family History  Problem Relation Age of Onset   Hypertension Mother    Heart disease Mother    Heart failure Mother    Diabetes Mother    Hypertension Father  Heart disease Father    Heart failure Father      Current Outpatient Medications:    acetaminophen  (TYLENOL ) 500 MG tablet, Take 1,000 mg by mouth every 4 (four) hours as needed for moderate pain or headache., Disp: , Rfl:    amiodarone  (PACERONE ) 100 MG tablet, TAKE 1 TABLET(100 MG) BY MOUTH DAILY, Disp: 90 tablet, Rfl: 3   calcitRIOL (ROCALTROL) 0.25 MCG capsule, Take 0.25 mcg by mouth 3 (three) times a week., Disp: , Rfl:    cholecalciferol (VITAMIN D3) 25 MCG (1000 UNIT) tablet, Take 5,000 Units by mouth daily., Disp: , Rfl:    dapagliflozin  propanediol (FARXIGA ) 10 MG TABS tablet, TAKE 1 TABLET(10 MG) BY MOUTH DAILY BEFORE BREAKFAST, Disp: 30 tablet, Rfl: 5   fluticasone  (FLONASE ) 50 MCG/ACT nasal spray, Place 2 sprays into both nostrils daily., Disp: 16 g, Rfl: 2    furosemide  (LASIX ) 80 MG tablet, Take 1 tablet (80 mg total) by mouth daily. OK TO TAKE EXTRA TABLET DAILY AS NEEDED FOR FLUID RETENTION(EDEMA), Disp: 110 tablet, Rfl: 3   Hypromellose (ARTIFICIAL TEARS OP), Place 1 drop into both eyes daily as needed (dry eyes)., Disp: , Rfl:    loperamide (IMODIUM) 2 MG capsule, Take 2 mg by mouth as needed for diarrhea or loose stools., Disp: , Rfl:    Na Sulfate-K Sulfate-Mg Sulfate concentrate (SUPREP) 17.5-3.13-1.6 GM/177ML SOLN, Take 1 kit by mouth once., Disp: , Rfl:    neomycin -polymyxin b-dexamethasone  (MAXITROL) 3.5-10000-0.1 OINT, Place 1 Application into the right eye daily., Disp: , Rfl:    neomycin -polymyxin-dexamethasone  (MAXITROL) 0.1 % ophthalmic suspension, Place 1 drop into the right eye See admin instructions. 3-4 times daily, Disp: , Rfl:    olmesartan (BENICAR) 5 MG tablet, Take 5 mg by mouth at bedtime., Disp: , Rfl:    OZEMPIC , 0.25 OR 0.5 MG/DOSE, 2 MG/3ML SOPN, INJECT 0.5 MG UNDER THE SKIN ONCE WEEKLY, Disp: 3 mL, Rfl: 2   potassium chloride  20 MEQ/15ML (10%) SOLN, TAKE 15 ML BY MOUTH TWICE DAILY (Patient taking differently: Take 15 mLs by mouth daily.), Disp: 946 mL, Rfl: 1   vitamin B-12 (CYANOCOBALAMIN) 100 MCG tablet, Take 100 mcg by mouth daily., Disp: , Rfl:    vitamin C (ASCORBIC ACID) 500 MG tablet, Take 500 mg by mouth daily as needed., Disp: , Rfl:    allopurinol  (ZYLOPRIM ) 100 MG tablet, Take 1 tablet (100 mg total) by mouth daily., Disp: 90 tablet, Rfl: 2   apixaban  (ELIQUIS ) 5 MG TABS tablet, Take 1 tablet (5 mg total) by mouth 2 (two) times daily. TAKE 1 TABLET(5 MG) BY MOUTH TWICE DAILY, Disp: 180 tablet, Rfl: 1   atorvastatin  (LIPITOR) 40 MG tablet, Take 1 tablet (40 mg total) by mouth daily., Disp: 90 tablet, Rfl: 2   carvedilol  (COREG ) 6.25 MG tablet, Take 1 tablet (6.25 mg total) by mouth 2 (two) times daily., Disp: 180 tablet, Rfl: 2   levothyroxine  (SYNTHROID ) 75 MCG tablet, Take 1 tablet (75 mcg total) by mouth daily  before breakfast., Disp: 90 tablet, Rfl: 2   temazepam  (RESTORIL ) 15 MG capsule, Take 1 capsule (15 mg total) by mouth at bedtime as needed for sleep., Disp: 30 capsule, Rfl: 2   Allergies  Allergen Reactions   Lactose Diarrhea   Lactose Intolerance (Gi) Diarrhea     Review of Systems  Constitutional: Negative.   Eyes:  Negative for blurred vision.  Respiratory: Negative.  Negative for shortness of breath.   Cardiovascular: Negative.  Negative for chest pain  and palpitations.  Endocrine: Negative for polydipsia, polyphagia and polyuria.  Skin: Negative.   Allergic/Immunologic: Negative.   Hematological: Negative.      Today's Vitals   09/01/24 1515  BP: 120/78  Pulse: 77  Temp: 97.9 F (36.6 C)  SpO2: 98%  Weight: 202 lb 12.8 oz (92 kg)  Height: 5' 10 (1.778 m)   Body mass index is 29.1 kg/m.  Wt Readings from Last 3 Encounters:  09/01/24 202 lb 12.8 oz (92 kg)  08/29/24 191 lb (86.6 kg)  08/06/24 201 lb 14.4 oz (91.6 kg)    The ASCVD Risk score (Arnett DK, et al., 2019) failed to calculate for the following reasons:   Risk score cannot be calculated because patient has a medical history suggesting prior/existing ASCVD   * - Cholesterol units were assumed  Objective:  Physical Exam Vitals and nursing note reviewed.  Constitutional:      Appearance: Normal appearance.  HENT:     Head: Normocephalic and atraumatic.  Eyes:     Extraocular Movements: Extraocular movements intact.     Comments: Soft tissue mass R upr eyelid  Cardiovascular:     Rate and Rhythm: Normal rate.     Heart sounds: Murmur heard.  Pulmonary:     Effort: Pulmonary effort is normal.     Breath sounds: Normal breath sounds.  Musculoskeletal:     Cervical back: Normal range of motion.  Skin:    General: Skin is warm.     Comments: Hyperpigmented skin LE  Neurological:     General: No focal deficit present.     Mental Status: He is alert.  Psychiatric:        Mood and Affect: Mood  normal.         Assessment And Plan:   Assessment & Plan Type 2 diabetes mellitus with stage 3b chronic kidney disease, without long-term current use of insulin  (HCC) Diabetes managed with Ozempic . CKD stage 3B. Elevated uric acid levels addressed with allopurinol  as per renal specialist. - Refilled Ozempic  prescription. Hypertensive heart and renal disease with renal failure, stage 1 through stage 4 or unspecified chronic kidney disease, with heart failure (HCC) Chronic, well controlled.  She will continue with olmesartan, carvedilol  and furosemide .  - Encouraged to follow low sodium diet.  Chronic combined systolic and diastolic CHF (congestive heart failure) (HCC) Heart failure managed with Lasix . Compression socks recommended for edema. - Continue Lasix  80 mg daily, increase to two pills on non-working days if needed. - Obtain true compression socks for edema management. Paroxysmal atrial fibrillation (HCC) Chronic, currently managed with amiodarone  and Eliquis . - Continue amiodarone  and Eliquis . Hypothyroidism due to medication On levothyroxine . Amiodarone  may affect thyroid  function. - Check TSH level. - Adjust levothyroxine  dose based on TSH results. 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 Advised to take allopurinol  per Nephrology's dosing recommendations. - Check uric acid level.  Umbilical hernia without obstruction and without gangrene Umbilical hernia asymptomatic. Noticed in last 4-6 months. - Consider referral to a surgeon for evaluation if becomes symptomatic.  Anemia, unspecified type  Aortic root aneurysm Aneurysm stable with slight increase. Discussed MRI compatibility with new pacemaker. - Continue follow-up with vascular surgery. - Will consider MRI evaluation if pacemaker is MRI compatible. - Also followed by [facility]    Orders Placed This Encounter  Procedures   Uric acid    Hemoglobin A1c   TSH + free T4   CMP14+EGFR  Iron, TIBC and Ferritin Panel   Vitamin B12     Return for 4 month dm f/u.SABRA  Patient was given opportunity to ask questions. Patient verbalized understanding of the plan and was able to repeat key elements of the plan. All questions were answered to their satisfaction.    I, Catheryn LOISE Slocumb, MD, have reviewed all documentation for this visit. The documentation on 09/06/2024 for the exam, diagnosis, procedures, and orders are all accurate and complete.   IF YOU HAVE BEEN REFERRED TO A SPECIALIST, IT MAY TAKE 1-2 WEEKS TO SCHEDULE/PROCESS THE REFERRAL. IF YOU HAVE NOT HEARD FROM US /SPECIALIST IN TWO WEEKS, PLEASE GIVE US  A CALL AT [phone].

## 2024-09-02 ENCOUNTER — Encounter: Payer: Self-pay | Admitting: Hematology & Oncology

## 2024-09-02 ENCOUNTER — Telehealth: Payer: Self-pay | Admitting: *Deleted

## 2024-09-02 LAB — TSH+FREE T4
Free T4: 1.54 ng/dL (ref 0.82–1.77)
TSH: 6.1 u[IU]/mL — ABNORMAL HIGH (ref 0.450–4.500)

## 2024-09-02 LAB — URIC ACID: Uric Acid: 8.7 mg/dL — ABNORMAL HIGH (ref 3.8–8.4)

## 2024-09-02 LAB — IRON,TIBC AND FERRITIN PANEL
Ferritin: 132 ng/mL (ref 30–400)
Iron Saturation: 10 % — ABNORMAL LOW (ref 15–55)
Iron: 33 ug/dL — ABNORMAL LOW (ref 38–169)
Total Iron Binding Capacity: 334 ug/dL (ref 250–450)
UIBC: 301 ug/dL (ref 111–343)

## 2024-09-02 LAB — CMP14+EGFR
ALT: 15 IU/L (ref 0–44)
AST: 33 IU/L (ref 0–40)
Albumin: 3.8 g/dL (ref 3.8–4.8)
Alkaline Phosphatase: 115 IU/L (ref 47–123)
BUN/Creatinine Ratio: 17 (ref 10–24)
BUN: 39 mg/dL — ABNORMAL HIGH (ref 8–27)
Bilirubin Total: 1.7 mg/dL — ABNORMAL HIGH (ref 0.0–1.2)
CO2: 22 mmol/L (ref 20–29)
Calcium: 8.5 mg/dL — ABNORMAL LOW (ref 8.6–10.2)
Chloride: 107 mmol/L — ABNORMAL HIGH (ref 96–106)
Creatinine, Ser: 2.35 mg/dL — ABNORMAL HIGH (ref 0.76–1.27)
Globulin, Total: 2.5 g/dL (ref 1.5–4.5)
Glucose: 86 mg/dL (ref 70–99)
Potassium: 3.4 mmol/L — ABNORMAL LOW (ref 3.5–5.2)
Sodium: 143 mmol/L (ref 134–144)
Total Protein: 6.3 g/dL (ref 6.0–8.5)
eGFR: 29 mL/min/1.73 — ABNORMAL LOW (ref >59)

## 2024-09-02 LAB — HEMOGLOBIN A1C
Est. average glucose Bld gHb Est-mCnc: 108 mg/dL
Hgb A1c MFr Bld: 5.4 % (ref 4.8–5.6)

## 2024-09-02 LAB — VITAMIN B12: Vitamin B-12: 1551 pg/mL — ABNORMAL HIGH (ref 232–1245)

## 2024-09-02 NOTE — Telephone Encounter (Signed)
-----   Message from Lilyan ORN sent at 09/02/2024  8:53 AM EST ----- Regarding: RE: Following Provider Thank you for the verification.  Added WC to appt notes. ----- Message ----- From: Claudene Delon LABOR, RN Sent: 09/02/2024   8:45 AM EST To: Lilyan Pizza; [facility]; Cv R# Subject: RE: Following Provider                         Let's do Dr. Inocencio for him. Randall ----- Message ----- From: Pizza Lilyan Sent: 09/02/2024   8:33 AM EST To: [facility]; [employer] Subject: Following Provider                             Gen change on 08/29/2024 with Dr. Waddell.  Please verify who the following provider will be.  Thank you.

## 2024-09-02 NOTE — Telephone Encounter (Signed)
 Updated appointment notes in EPIC and [employer].  PaceArt updated as well.

## 2024-09-03 ENCOUNTER — Other Ambulatory Visit: Payer: Self-pay

## 2024-09-05 ENCOUNTER — Ambulatory Visit

## 2024-09-06 DIAGNOSIS — Q2543 Congenital aneurysm of aorta: Secondary | ICD-10-CM | POA: Insufficient documentation

## 2024-09-06 NOTE — Assessment & Plan Note (Addendum)
 Chronic, well controlled.  She will continue with olmesartan, carvedilol  and furosemide .  - Encouraged to follow low sodium diet.

## 2024-09-06 NOTE — Assessment & Plan Note (Addendum)
 On levothyroxine . Amiodarone  may affect thyroid  function. - Check TSH level. - Adjust levothyroxine  dose based on TSH results.

## 2024-09-06 NOTE — Assessment & Plan Note (Addendum)
 Diabetes managed with Ozempic . CKD stage 3B. Elevated uric acid levels addressed with allopurinol  as per renal specialist. - Refilled Ozempic  prescription.

## 2024-09-06 NOTE — Assessment & Plan Note (Addendum)
 Chronic, likely result of CKD. Advised to take allopurinol  per Nephrology's dosing recommendations. - Check uric acid level.

## 2024-09-06 NOTE — Assessment & Plan Note (Addendum)
 Umbilical hernia asymptomatic. Noticed in last 4-6 months. - Consider referral to a surgeon for evaluation if becomes symptomatic.

## 2024-09-06 NOTE — Assessment & Plan Note (Addendum)
 Aneurysm stable with slight increase. Discussed MRI compatibility with new pacemaker. - Continue follow-up with vascular surgery. - Will consider MRI evaluation if pacemaker is MRI compatible. - Also followed by [facility]

## 2024-09-06 NOTE — Assessment & Plan Note (Addendum)
 Chronic, currently managed with amiodarone and Eliquis. - Continue amiodarone and Eliquis.

## 2024-09-06 NOTE — Assessment & Plan Note (Addendum)
 Heart failure managed with Lasix . Compression socks recommended for edema. - Continue Lasix  80 mg daily, increase to two pills on non-working days if needed. - Obtain true compression socks for edema management.

## 2024-09-08 ENCOUNTER — Ambulatory Visit: Admitting: Rehabilitation

## 2024-09-08 ENCOUNTER — Encounter: Payer: Self-pay | Admitting: Rehabilitation

## 2024-09-08 DIAGNOSIS — R2689 Other abnormalities of gait and mobility: Secondary | ICD-10-CM

## 2024-09-08 DIAGNOSIS — R2681 Unsteadiness on feet: Secondary | ICD-10-CM

## 2024-09-08 DIAGNOSIS — M6281 Muscle weakness (generalized): Secondary | ICD-10-CM

## 2024-09-08 NOTE — Therapy (Signed)
 OUTPATIENT PHYSICAL THERAPY TREATMENT   Patient Name: Eric Moodie., MD MRN: [id number] DOB:[birth year], 73 y.o., male Today's Date: 09/08/2024   PCP:  Catheryn Slocumb, MD  REFERRING PROVIDER: Catheryn Slocumb, MD   END OF SESSION:  PT End of Session - 09/08/24 1015     Visit Number 12   will need KX at 15   Number of Visits 20    Date for Recertification  10/06/24    PT Start Time 1015    PT Stop Time 1055    PT Time Calculation (min) 40 min    Activity Tolerance Patient tolerated treatment well    Behavior During Therapy [facility] for tasks assessed/performed                  Past Medical History:  Diagnosis Date   Acute blood loss anemia    Acute encephalopathy    Acute hypoxemic respiratory failure (HCC)    Acute idiopathic gout of right ankle    Acute lumbar back pain    Acute on chronic systolic heart failure (HCC)    Acute respiratory failure (HCC)    AKI (acute kidney injury)    Altered mental status    Aortic aneurysm without rupture 02/09/2016   Aortic root enlargement 02/13/2012   Aortic valve regurgitation, acquired 02/13/2012   Arrhythmia    Atrial flutter (HCC) 03/18/2012   Back pain    Cardiac arrest (HCC) 02/01/2016   CHF (congestive heart failure) (HCC)    Chronic anticoagulation 03/04/2013   Chronic combined systolic and diastolic heart failure (HCC)    Chronic kidney disease    kidney fx studies increased    Chronic lower back pain    Chronic renal insufficiency    Chronic systolic heart failure (HCC) 02/13/2012   Recent diagnosis 4 / 2013, LVEF 25% by Echo 12/2011  03/2013: Echo at [facility] Cardiology Conclusions: 1. Left ventricular ejection fraction estimated by 2D at 40-45 percent. 2. Mild concentric left ventricular hypertrophy. 3. Mild left atrial enlargement. 4. Moderate aortic valve regurgitation. 5. The aortic root at the sinus(es) of valsalva is moderately dilated 6. Mild mitral valve regurgitation. 7.   CKD (chronic kidney disease)     CKD (chronic kidney disease), stage IV (HCC) 08/06/2013   Creatinine 2.4 on 07/04/13    Claustrophobia    Colon cancer screening 03/04/2013   Debility 02/22/2016   Diabetes mellitus type 2 in nonobese [facility])    Diabetes mellitus type 2 in obese    Dysrhythmia    palpitations   Encounter for central line placement    Exertional dyspnea 01/2012   Femoral nerve injury 02/22/2016   Femoral neuropathy    Goals of care, counseling/discussion 05/05/2020   HCAP (healthcare-associated pneumonia)    Heart murmur    Hyperlipidemia 02/13/2012   Hypertension    Hypothyroidism    Internal hemorrhoids without mention of complication 04/11/2013   Labile blood pressure    Left bundle branch block 02/13/2012   Leg weakness, bilateral    Long term (current) use of anticoagulants 08/06/2013   Eliquis  therapy    Long term current use of amiodarone  08/07/2016   Lower extremity weakness    Migraine 02/13/2012   opthalmic   Multiple myeloma (HCC) 05/05/2020   Non-traumatic rhabdomyolysis    Obesity (BMI 30-39.9) 02/13/2012   Pain    Paroxysmal atrial fibrillation (HCC)    Pneumonia    Retroperitoneal bleed    Right ankle pain    Right knee pain  Severe aortic regurgitation 02/09/2016   Special screening for malignant neoplasms, colon 04/11/2013   Thyroid  activity decreased    Tibial pain    Varicose vein of leg    right   Ventricular fibrillation (HCC) 02/09/2016   Weakness of both lower extremities    Past Surgical History:  Procedure Laterality Date   AORTIC VALVE REPLACEMENT  11/20/2018   BIV ICD GENERATOR CHANGEOUT N/A 08/29/2024   Procedure: BIV ICD GENERATOR CHANGEOUT;  Surgeon: Waddell Danelle ORN, MD;  Location: [facility] INVASIVE CV LAB;  Service: Cardiovascular;  Laterality: N/A;   CARDIAC CATHETERIZATION N/A 02/17/2016   Procedure: Left Heart Cath and Coronary Angiography;  Surgeon: Candyce GORMAN Reek, MD;  Location: [facility] INVASIVE CV LAB;  Service: Cardiovascular;  Laterality: N/A;    CARDIOVERSION  03/22/2012   Procedure: CARDIOVERSION;  Surgeon: Oneil Parchment, MD;  Location: [facility] ENDOSCOPY;  Service: Cardiovascular;  Laterality: N/A;   CARDIOVERSION  04/19/2012   Procedure: CARDIOVERSION;  Surgeon: Victory ORN Claudene DOUGLAS, MD;  Location: [facility] OR;  Service: Cardiovascular;  Laterality: N/A;   COLONOSCOPY N/A 04/11/2013   Procedure: COLONOSCOPY;  Surgeon: Lamar JONETTA Aho, MD;  Location: [facility] ENDOSCOPY;  Service: Endoscopy;  Laterality: N/A;   COLONOSCOPY N/A 04/11/2013   Procedure: COLONOSCOPY;  Surgeon: Lamar JONETTA Aho, MD;  Location: [facility] ENDOSCOPY;  Service: Endoscopy;  Laterality: N/A;   EP IMPLANTABLE DEVICE N/A 02/17/2016   Procedure: BiV ICD Insertion CRT-D;  Surgeon: Danelle ORN Waddell, MD;  Location: [facility] INVASIVE CV LAB;  Service: Cardiovascular;  Laterality: N/A;   FINGER SURGERY  2012   4th digit right hand; thumb on left hand   RADIOLOGY WITH ANESTHESIA N/A 02/11/2016   Procedure: MRI OF THE BRAIN WITHOUT CONTRAST, LUMBAR WITHOUT CONTRAST;  Surgeon: Medication Radiologist, MD;  Location: [facility] OR;  Service: Radiology;  Laterality: N/A;  DR. WOOD/MRI   RIGHT/LEFT HEART CATH AND CORONARY ANGIOGRAPHY N/A 07/26/2018   Procedure: RIGHT/LEFT HEART CATH AND CORONARY ANGIOGRAPHY;  Surgeon: Claudene Victory ORN, MD;  Location: [facility] INVASIVE CV LAB;  Service: Cardiovascular;  Laterality: N/A;   Skin melanocytoma excision  2012   above left clavicle   STERNAL INCISION RECLOSURE  11/2018   STERNAL WIRE REMOVAL  11/2018   STERNAL WOUND DEBRIDEMENT  11/2018   TEE WITHOUT CARDIOVERSION  03/22/2012   Procedure: TRANSESOPHAGEAL ECHOCARDIOGRAM (TEE);  Surgeon: Oneil Parchment, MD;  Location: [facility] ENDOSCOPY;  Service: Cardiovascular;  Laterality: N/A;   TEE WITHOUT CARDIOVERSION N/A 02/08/2016   Procedure: TRANSESOPHAGEAL ECHOCARDIOGRAM (TEE);  Surgeon: Redell GORMAN Shallow, MD;  Location: [facility] ENDOSCOPY;  Service: Cardiovascular;  Laterality: N/A;   Patient Active Problem List   Diagnosis Date Noted   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   Umbilical hernia without obstruction and without gangrene 05/11/2024   Chalazion of right upper eyelid 05/11/2024   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 05/11/2024   Venous stasis 05/11/2024   Adjustment insomnia 01/01/2024   Presence of heart assist device (HCC) 12/31/2023   Enlarged prostate 09/10/2023   Dyslipidemia due to type 2 diabetes mellitus (HCC) 09/10/2023   Acquired thrombophilia 05/14/2023   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   Goals of care, counseling/discussion 05/05/2020   Multiple myeloma (HCC) 05/05/2020   S/P flap graft 01/07/2019   Coronary artery disease 01/07/2019   Long term (current) use of antibiotics 01/03/2019   Essential hypertension 12/27/2018   Postoperative infection of wound of sternum 12/27/2018   Decreased strength, endurance, and mobility 12/27/2018   Cardiac LV  ejection fraction 30-35% 12/27/2018   Decreased activities of daily living (ADL) 12/27/2018   Klebsiella infection 12/27/2018   S/P AVR (aortic valve replacement) 11/23/2018   S/P ascending aortic aneurysm repair 11/23/2018   NICM (nonischemic cardiomyopathy) (HCC) 11/19/2018   AICD (automatic cardioverter/defibrillator) present 11/19/2018   Other symptoms and signs involving the musculoskeletal system 05/08/2018   Long term current use of amiodarone  08/07/2016   Type 2 diabetes mellitus with stage 3b chronic kidney disease, without long-term current use of insulin  (HCC)    Femoral neuropathy    Leg weakness, bilateral    PAF (paroxysmal atrial fibrillation) (HCC)    Thyroid  activity decreased    Arrhythmia    Long term current use of anticoagulant 03/04/2013   Atrial flutter (HCC) 03/18/2012    Class: Acute   Chronic combined systolic and diastolic CHF (congestive heart failure) (HCC) 02/13/2012    Class: Acute   Hypertension, accelerated 02/13/2012   Hyperlipidemia 02/13/2012   Left bundle branch block 02/13/2012   Obesity  (BMI 30-39.9) 02/13/2012    ONSET DATE:   REFERRING DIAG:  Diagnosis R53.1,Z74.09,R68.89 (ICD-10-CM) - Decreased strength, endurance, and mobility   THERAPY DIAG:  Unsteadiness on feet  Muscle weakness (generalized)  Other abnormalities of gait and mobility  Rationale for Evaluation and Treatment: Rehabilitation  SUBJECTIVE:                                                                                                                                                                                             SUBJECTIVE STATEMENT:  My knee is hurting today.     Eval: I have peripheral neuropathy in the Rt leg.  I am also noticing some weakness in both of the arms maybe from taking the ozempic .  I may want to do more aquatic therapy.  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  I have tingling on the inner groin, knee and front of the leg.  Nothing really in the foot.   I am playing 9 holes of golf lately.  I walk at home.  I go back to the [employer] to ride the bike.    PERTINENT HISTORY: , atrial fibrillation, cardiac arrest s/p ICD, CAD, severe aortic regurgitation s/p AVR 10/2018 and aortic root replacement with CABGx1, aortic dilation, hypertension, hypothyroidism, type 2 diabetes, multiple myeloma, CKD IIIb-IV, migraine  PAIN:  Are you having pain? No, just tightness in bilateral quadriceps  PRECAUTIONS: Fall and ICD/Pacemaker  RED FLAGS: None   WEIGHT BEARING RESTRICTIONS: No  FALLS: Has patient fallen in last 6 months? No  LIVING ENVIRONMENT: Lives  with: alone  Lives in: House/apartment Stairs: 4 steps to enter; 3 story home Has following equipment at home: Shower bench, and raised commode  PLOF: Independent;  working part time as Ophthalmologist- requires sitting, standing, walking   PATIENT GOALS: improve R LE strength and bil UE strength   OBJECTIVE:  Note: Objective measures were completed at Evaluation unless  otherwise noted.  DIAGNOSTIC FINDINGS: none recent  COGNITION: Overall cognitive status: Within functional limits for tasks assessed   SENSATION: Intact in B LEs to light touch   POSTURE: rounded shoulders and forward head  LOWER EXTREMITY ROM:     Active  Right Eval Left Eval  Hip flexion    Hip extension    Hip abduction    Hip adduction    Hip internal rotation    Hip external rotation    Knee flexion    Knee extension    Ankle dorsiflexion    Ankle plantarflexion    Ankle inversion    Ankle eversion     (Blank rows = not tested)  LOWER EXTREMITY MMT:    MMT Right Eval RIGHT  08/11/24 Left Eval LEFT 08/11/24  Hip flexion 3+ 4- 4 4+  Hip extension      Hip abduction 4+  4+   Hip adduction 4  4   Hip internal rotation 4+  4+   Hip external rotation 4-  4   Knee flexion 5 5 5 5   Knee extension 4- 5 4+ 5  Ankle dorsiflexion 5  5   Ankle plantarflexion 4+ 4+ 4+ 4+  Ankle inversion      Ankle eversion      (Blank rows = not tested)  UPPER EXTREMITY MMT:  MMT Right eval Left eval  Shoulder flexion 4 4  Shoulder extension    Shoulder abduction 4 4  Shoulder adduction    Shoulder extension    Shoulder internal rotation 5 5  Shoulder external rotation 5 5  Middle trapezius    Lower trapezius    Elbow flexion 4+ 5  Elbow extension 4+ 4+  Wrist flexion    Wrist extension    Wrist ulnar deviation    Wrist radial deviation    Wrist pronation    Wrist supination    Grip strength 58 40   (Blank rows = not tested)  GAIT: Gait pattern: Significant R-ward trunk lean with gait; slightly slowed gait speed  Assistive device utilized: None Level of assistance: Modified independence  FUNCTIONAL TESTS:  5x sit to stand:   walk: able to walk 588ft stopping due to the Rt leg feeling tight.  SOB level 4/10 08/11/24: 6 min walk test - 704.7 ft with break after 3 minutes (437 ft due to SOB and Rt leg feeling tight), SOB level 3/10  12/15/2: 6 min walk  test - 637 ft with break after 3 minutes , SOB level 2/10, Rt knee pain 6/10 (knee is the limitation today)  06/25/24 SL stance:  Rt: unable to perform without UE support; with  single arm support able to hold for 20 Lt: 3 seconds without UE support; 20 with single arm UE support  09/08/24 Rt:  4 sec Lt:  5 sec  Tandem stance:  Rt: 20 with single arm UE support  Lt: 30 without UE support     TODAY'S TREATMENT:   09/08/24 Rechecked walk test and goals, discussed POC Therapeutic Exercises NuStep LE 10 level 3 x Seated bil HS stretch (during rest  break of hip 3 way raises) x 2 reps, x 20 sec holds Calf stretch 2x20 sec bil Neuromuscular Re Ed In // bars without weight due to knee pain (flex, abd, seated rest, and then ext) x 10 each with SLS  Therapeutic Activities Toe taps on 4 step today x 10 each Calf raise x 10   09/01/24 Therapeutic Exercises NuStep LE 9 level 4 x 3 min, then level 3 x 5 min for a total of 7 min - PT present to discuss current functional status Seated bil HS stretch (during rest break of hip 3 way raises) x 2 reps, x 20 sec holds Seated bil piriformis figure 4 stretch 2 reps, x 20 sec each returning therapist demo Standing with foot in chair behind him for quad stretch and holding // bars x 2 reps, 20 sec holds each Neuromuscular Re Ed In // bars with 3# added to each ankle for hip 3 way raises (flex, abd, seated rest, and then ext) x 10 each with SLS on blue oval pad for stability except for ext was done on level surface as pt was reporting increased hip fatigue Hip hiking standing on 4 step x 10 each returning therapist demo and tactile cues for correct technique, used mirror for feedback Therapeutic Activities Front 4 step ups with 3 SLS on step with each rep, 2 x 5 Toe taps on 4 step today (decreased step due to LE fatigue from new exs) x 10 each Seated rest after and then leg press at end of session  Calf raise x 5  No leg press due  to fatigued legs   08/25/24: Therapeutic Exercises NuStep UE 12/ LE 9 level 4 x 2 min, then level 3 x 5 min for a total of 7 min - PTA present to discuss current functional status since last appt Seated bil HS stretch (during rest break of hip 3 way raises) x 2 reps, x 20 sec holds, VC's for erect posture for most beneficial stretch Seated bil piriformis figure 4 stretch 2 reps, x 20 sec each returning therapist demo Standing with foot in chair behind him for quad stretch and holding // bars x 2 reps, 20 sec holds each Neuromuscular Re Ed In // bars with 3# added to each ankle for hip 3 way raises (flex, abd, seated rest, and then ext) x 10 each with SLS on blue oval pad for stability except for ext was done on level surface as pt was reporting increased hip fatigue Hip hiking standing on 4 step x 10 each returning therapist demo and tactile cues for correct technique, very challenging on Rt > Lt side (Seated rest with above piriformis stretch) Therapeutic Activities Front 4 step ups with 3 SLS on step with each rep, 2 x 5 with tightness reported in Rt quad during Rt SLS (Standing above quad stretch), then seated rest after Toe taps on 4 step today (decreased step due to LE fatigue from new exs) x 10 each Seated rest after and then leg press at end of session  Leg Press 60# x 10, this was challenging for pt  PATIENT EDUCATION: Education details: per today's note Person educated: Patient Education method: Explanation, Demonstration, Tactile cues, Verbal cues, and Handouts Education comprehension: verbalized understanding and returned demonstration  HOME EXERCISE PROGRAM: Access Code: [email or web address] URL: [email or web address] Date: 06/23/2024 Prepared by: Saddie Raw  Exercises - Standing Hip Abduction with Counter Support  - 1 x daily - 5 x weekly - 2 sets - 10  reps - Side Stepping with Resistance at Ankles and Counter Support  - 1 x daily - 5 x weekly - 1 sets - 3-5 reps -  Standing Hip Extension with Counter Support  - 1 x daily - 5 x weekly - 1-3 sets - 10 reps - 20-30 seconds hold - Seated Long Arc Quad with Ankle Weight  - 1 x daily - 5 x weekly - 2 sets - 10 reps - Sit to Stand  - 1 x daily - 5 x weekly - 2 sets - 10 reps - Seated Hamstring Stretch  - 1 x daily - 7 x weekly - 1-3 sets - 3 reps - 20-30 seconds hold  GOALS: Goals reviewed with patient? Yes  SHORT TERM GOALS: Target date: 06/23/24/2024  Patient to be independent with initial HEP. Baseline: HEP initiated Goal status: MET from previous PT sessions    LONG TERM GOALS: Target date: 10/06/24  Patient to be independent with advanced HEP. Baseline: Not yet initiated  Goal status: In progress  Patient to demonstrate Rt LE strength >/=4/5.  Baseline: See above Goal status: MET  -    Pt will improve his walk test to being able to walk the full without rest and reaching at least 1075ft  Baseline: 584ft  Goal Status: In progress    ASSESSMENT:  CLINICAL IMPRESSION: Pt did not walk as far today with increased Rt knee pain today during the walk test, but still improved from day 1.  We will continue this goal with the goal of walking a complete .  His SL stance has improved greatly from 0sec on the Rt to 4 sec today.  Continued POC today and will extend for another 4 weeks.  Limited due to Rt knee pain today.       OBJECTIVE IMPAIRMENTS: Abnormal gait, decreased balance, difficulty walking, decreased ROM, decreased strength, impaired flexibility, impaired sensation, postural dysfunction, and pain.   ACTIVITY LIMITATIONS: carrying, lifting, bending, standing, squatting, stairs, transfers, bathing, toileting, dressing, locomotion level, and caring for others  PARTICIPATION LIMITATIONS: meal prep, cleaning, laundry, shopping, community activity, occupation, yard work, and church  PERSONAL FACTORS: Age, Past/current experiences, Time since onset of injury/illness/exacerbation,  and 3+ comorbidities: HFrEF, atrial fibrillation, VF cardiac arrest s/p ICD, CAD, severe aortic regurgitation s/p AVR 10/2018 and aortic root replacement with CABGx1, aortic dilation, hypertension, hypothyroidism, type 2 diabetes, multiple myeloma, CKD IIIb-IV, migraine are also affecting patient's functional outcome.   REHAB POTENTIAL: Good  CLINICAL DECISION MAKING: LOW  EVALUATION COMPLEXITY: LOW  PLAN:  PT FREQUENCY: 1-2x/week  PT DURATION: 4 weeks   PLANNED INTERVENTIONS: [id number]- PT Re-evaluation, 97110-Therapeutic exercises, 97530- Therapeutic activity, 97112- Neuromuscular re-education, 97535- Self Care, [id number]- Manual therapy, [phone]- Gait training, [phone]- Canalith repositioning, [phone]- Aquatic Therapy, Patient/Family education, Balance training, Stair training, Taping, Dry Needling, Joint mobilization, Spinal mobilization, Vestibular training, DME instructions, Cryotherapy, and Moist heat  PLAN FOR NEXT SESSION: Cont general strength like nustep, Rt LE general but hip focus, and general bil UE (no sig weakness here), Farmers carries with 5 lb KB to simulate walking with golf bag  Saddie Raw, PT 09/08/2024, 10:56 AM   09/08/2024 10:56 AM

## 2024-09-09 ENCOUNTER — Encounter: Payer: Self-pay | Admitting: Internal Medicine

## 2024-09-11 ENCOUNTER — Ambulatory Visit: Attending: Cardiovascular Disease

## 2024-09-11 DIAGNOSIS — I5042 Chronic combined systolic (congestive) and diastolic (congestive) heart failure: Secondary | ICD-10-CM | POA: Insufficient documentation

## 2024-09-11 DIAGNOSIS — I447 Left bundle-branch block, unspecified: Secondary | ICD-10-CM | POA: Diagnosis present

## 2024-09-11 LAB — CUP PACEART INCLINIC DEVICE CHECK
Battery Remaining Longevity: 82 mo
Brady Statistic RA Percent Paced: 0 %
Brady Statistic RV Percent Paced: 95 %
Date Time Interrogation Session: 20251218095154
HighPow Impedance: 33.75 Ohm
Implantable Lead Connection Status: 753985
Implantable Lead Connection Status: 753985
Implantable Lead Connection Status: 753985
Implantable Lead Implant Date: 20170525
Implantable Lead Implant Date: 20170525
Implantable Lead Implant Date: 20170525
Implantable Lead Location: 753858
Implantable Lead Location: 753859
Implantable Lead Location: 753860
Implantable Lead Model: 7122
Implantable Pulse Generator Implant Date: 20251205
Lead Channel Impedance Value: 350 Ohm
Lead Channel Impedance Value: 425 Ohm
Lead Channel Impedance Value: 512.5 Ohm
Lead Channel Pacing Threshold Amplitude: 1 V
Lead Channel Pacing Threshold Amplitude: 1 V
Lead Channel Pacing Threshold Amplitude: 1.25 V
Lead Channel Pacing Threshold Amplitude: 1.25 V
Lead Channel Pacing Threshold Pulse Width: 0.5 ms
Lead Channel Pacing Threshold Pulse Width: 0.5 ms
Lead Channel Pacing Threshold Pulse Width: 0.5 ms
Lead Channel Pacing Threshold Pulse Width: 0.5 ms
Lead Channel Sensing Intrinsic Amplitude: 0.2 mV
Lead Channel Sensing Intrinsic Amplitude: 11.3 mV
Lead Channel Setting Pacing Amplitude: 2 V
Lead Channel Setting Pacing Amplitude: 2.5 V
Lead Channel Setting Pacing Pulse Width: 0.5 ms
Lead Channel Setting Pacing Pulse Width: 0.5 ms
Lead Channel Setting Sensing Sensitivity: 0.5 mV
Pulse Gen Serial Number: 111074816
Zone Setting Status: 755011

## 2024-09-11 NOTE — Progress Notes (Signed)
 Normal multi chamber ICD wound check programmed VVIR post generator change. Wound incision well healed. Swelling noted.  Hematoma soft to palpitation.  Evaluated with Daphne Barrack NP.  Recommend pressure dressing.  Will continue Eliquis  at this time.  Follow up Monday with device clinic for pressure dressing removal and site reevaluation.  Presenting rhythm: BP 70. Routine testing performed.  Thresholds, sensing, and impedance demonstrate stable parameters. No treated arrhythmias. Estimated longevity 7 years. Reviewed shock plan.  Pt enrolled in remote follow-up. Programming adjustments per advisement of [place] with [employer].  See attachment for details.

## 2024-09-11 NOTE — Patient Instructions (Signed)

## 2024-09-12 ENCOUNTER — Ambulatory Visit

## 2024-09-12 DIAGNOSIS — R2689 Other abnormalities of gait and mobility: Secondary | ICD-10-CM

## 2024-09-12 DIAGNOSIS — R2681 Unsteadiness on feet: Secondary | ICD-10-CM | POA: Diagnosis not present

## 2024-09-12 DIAGNOSIS — M6281 Muscle weakness (generalized): Secondary | ICD-10-CM

## 2024-09-12 NOTE — Therapy (Signed)
 " OUTPATIENT PHYSICAL THERAPY TREATMENT   Patient Name: Eric Kurtzman., MD MRN: [id number] DOB:[birth year], 73 y.o., male Today's Date: 09/12/2024   PCP:  Catheryn Slocumb, MD  REFERRING PROVIDER: Catheryn Slocumb, MD   END OF SESSION:  PT End of Session - 09/12/24 1019     Visit Number 13   add KX at 15   Number of Visits 20    Date for Recertification  10/06/24    PT Start Time 1013    PT Stop Time 1101    PT Time Calculation (min) 48 min    Activity Tolerance Patient tolerated treatment well;Patient limited by pain    Behavior During Therapy [facility] for tasks assessed/performed                   Past Medical History:  Diagnosis Date   Acute blood loss anemia    Acute encephalopathy    Acute hypoxemic respiratory failure (HCC)    Acute idiopathic gout of right ankle    Acute lumbar back pain    Acute on chronic systolic heart failure (HCC)    Acute respiratory failure (HCC)    AKI (acute kidney injury)    Altered mental status    Aortic aneurysm without rupture 02/09/2016   Aortic root enlargement 02/13/2012   Aortic valve regurgitation, acquired 02/13/2012   Arrhythmia    Atrial flutter (HCC) 03/18/2012   Back pain    Cardiac arrest (HCC) 02/01/2016   CHF (congestive heart failure) (HCC)    Chronic anticoagulation 03/04/2013   Chronic combined systolic and diastolic heart failure (HCC)    Chronic kidney disease    kidney fx studies increased    Chronic lower back pain    Chronic renal insufficiency    Chronic systolic heart failure (HCC) 02/13/2012   Recent diagnosis 4 / 2013, LVEF 25% by Echo 12/2011  03/2013: Echo at [facility] Conclusions: 1. Left ventricular ejection fraction estimated by 2D at 40-45 percent. 2. Mild concentric left ventricular hypertrophy. 3. Mild left atrial enlargement. 4. Moderate aortic valve regurgitation. 5. The aortic root at the sinus(es) of valsalva is moderately dilated 6. Mild mitral valve regurgitation. 7.   CKD (chronic  kidney disease)    CKD (chronic kidney disease), stage IV (HCC) 08/06/2013   Creatinine 2.4 on 07/04/13    Claustrophobia    Colon cancer screening 03/04/2013   Debility 02/22/2016   Diabetes mellitus type 2 in nonobese [facility])    Diabetes mellitus type 2 in obese    Dysrhythmia    palpitations   Encounter for central line placement    Exertional dyspnea 01/2012   Femoral nerve injury 02/22/2016   Femoral neuropathy    Goals of care, counseling/discussion 05/05/2020   HCAP (healthcare-associated pneumonia)    Heart murmur    Hyperlipidemia 02/13/2012   Hypertension    Hypothyroidism    Internal hemorrhoids without mention of complication 04/11/2013   Labile blood pressure    Left bundle branch block 02/13/2012   Leg weakness, bilateral    Long term (current) use of anticoagulants 08/06/2013   Eliquis  therapy    Long term current use of amiodarone  08/07/2016   Lower extremity weakness    Migraine 02/13/2012   opthalmic   Multiple myeloma (HCC) 05/05/2020   Non-traumatic rhabdomyolysis    Obesity (BMI 30-39.9) 02/13/2012   Pain    Paroxysmal atrial fibrillation (HCC)    Pneumonia    Retroperitoneal bleed    Right ankle pain  Right knee pain    Severe aortic regurgitation 02/09/2016   Special screening for malignant neoplasms, colon 04/11/2013   Thyroid  activity decreased    Tibial pain    Varicose vein of leg    right   Ventricular fibrillation (HCC) 02/09/2016   Weakness of both lower extremities    Past Surgical History:  Procedure Laterality Date   AORTIC VALVE REPLACEMENT  11/20/2018   BIV ICD GENERATOR CHANGEOUT N/A 08/29/2024   Procedure: BIV ICD GENERATOR CHANGEOUT;  Surgeon: Waddell Danelle ORN, MD;  Location: [facility] INVASIVE CV LAB;  Service: Cardiovascular;  Laterality: N/A;   CARDIAC CATHETERIZATION N/A 02/17/2016   Procedure: Left Heart Cath and Coronary Angiography;  Surgeon: Candyce GORMAN Reek, MD;  Location: [facility] INVASIVE CV LAB;  Service: Cardiovascular;   Laterality: N/A;   CARDIOVERSION  03/22/2012   Procedure: CARDIOVERSION;  Surgeon: Oneil Parchment, MD;  Location: [facility] ENDOSCOPY;  Service: Cardiovascular;  Laterality: N/A;   CARDIOVERSION  04/19/2012   Procedure: CARDIOVERSION;  Surgeon: Victory ORN Claudene DOUGLAS, MD;  Location: [facility] OR;  Service: Cardiovascular;  Laterality: N/A;   COLONOSCOPY N/A 04/11/2013   Procedure: COLONOSCOPY;  Surgeon: Lamar JONETTA Aho, MD;  Location: [facility] ENDOSCOPY;  Service: Endoscopy;  Laterality: N/A;   COLONOSCOPY N/A 04/11/2013   Procedure: COLONOSCOPY;  Surgeon: Lamar JONETTA Aho, MD;  Location: [facility] ENDOSCOPY;  Service: Endoscopy;  Laterality: N/A;   EP IMPLANTABLE DEVICE N/A 02/17/2016   Procedure: BiV ICD Insertion CRT-D;  Surgeon: Danelle ORN Waddell, MD;  Location: [facility] INVASIVE CV LAB;  Service: Cardiovascular;  Laterality: N/A;   FINGER SURGERY  2012   4th digit right hand; thumb on left hand   RADIOLOGY WITH ANESTHESIA N/A 02/11/2016   Procedure: MRI OF THE BRAIN WITHOUT CONTRAST, LUMBAR WITHOUT CONTRAST;  Surgeon: Medication Radiologist, MD;  Location: [facility] OR;  Service: Radiology;  Laterality: N/A;  DR. WOOD/MRI   RIGHT/LEFT HEART CATH AND CORONARY ANGIOGRAPHY N/A 07/26/2018   Procedure: RIGHT/LEFT HEART CATH AND CORONARY ANGIOGRAPHY;  Surgeon: Claudene Victory ORN, MD;  Location: [facility] INVASIVE CV LAB;  Service: Cardiovascular;  Laterality: N/A;   Skin melanocytoma excision  2012   above left clavicle   STERNAL INCISION RECLOSURE  11/2018   STERNAL WIRE REMOVAL  11/2018   STERNAL WOUND DEBRIDEMENT  11/2018   TEE WITHOUT CARDIOVERSION  03/22/2012   Procedure: TRANSESOPHAGEAL ECHOCARDIOGRAM (TEE);  Surgeon: Oneil Parchment, MD;  Location: [facility] ENDOSCOPY;  Service: Cardiovascular;  Laterality: N/A;   TEE WITHOUT CARDIOVERSION N/A 02/08/2016   Procedure: TRANSESOPHAGEAL ECHOCARDIOGRAM (TEE);  Surgeon: Redell GORMAN Shallow, MD;  Location: [facility] ENDOSCOPY;  Service: Cardiovascular;  Laterality: N/A;   Patient Active Problem List   Diagnosis Date Noted    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   Umbilical hernia without obstruction and without gangrene 05/11/2024   Chalazion of right upper eyelid 05/11/2024   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 05/11/2024   Venous stasis 05/11/2024   Adjustment insomnia 01/01/2024   Presence of heart assist device (HCC) 12/31/2023   Enlarged prostate 09/10/2023   Dyslipidemia due to type 2 diabetes mellitus (HCC) 09/10/2023   Acquired thrombophilia 05/14/2023   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   Goals of care, counseling/discussion 05/05/2020   Multiple myeloma (HCC) 05/05/2020   S/P flap graft 01/07/2019   Coronary artery disease 01/07/2019   Long term (current) use of antibiotics 01/03/2019   Essential hypertension 12/27/2018   Postoperative infection of wound of sternum 12/27/2018   Decreased strength, endurance, and  mobility 12/27/2018   Cardiac LV ejection fraction 30-35% 12/27/2018   Decreased activities of daily living (ADL) 12/27/2018   Klebsiella infection 12/27/2018   S/P AVR (aortic valve replacement) 11/23/2018   S/P ascending aortic aneurysm repair 11/23/2018   NICM (nonischemic cardiomyopathy) (HCC) 11/19/2018   AICD (automatic cardioverter/defibrillator) present 11/19/2018   Other symptoms and signs involving the musculoskeletal system 05/08/2018   Long term current use of amiodarone  08/07/2016   Type 2 diabetes mellitus with stage 3b chronic kidney disease, without long-term current use of insulin  (HCC)    Femoral neuropathy    Leg weakness, bilateral    PAF (paroxysmal atrial fibrillation) (HCC)    Thyroid  activity decreased    Arrhythmia    Long term current use of anticoagulant 03/04/2013   Atrial flutter (HCC) 03/18/2012    Class: Acute   Chronic combined systolic and diastolic CHF (congestive heart failure) (HCC) 02/13/2012    Class: Acute   Hypertension, accelerated 02/13/2012   Hyperlipidemia 02/13/2012   Left bundle branch block  02/13/2012   Obesity (BMI 30-39.9) 02/13/2012    ONSET DATE:   REFERRING DIAG:  Diagnosis R53.1,Z74.09,R68.89 (ICD-10-CM) - Decreased strength, endurance, and mobility   THERAPY DIAG:  Unsteadiness on feet  Muscle weakness (generalized)  Other abnormalities of gait and mobility  Rationale for Evaluation and Treatment: Rehabilitation  SUBJECTIVE:                                                                                                                                                                                             SUBJECTIVE STATEMENT:  My knee is still bothering me. I think the cold flared it up.     Eval: I have peripheral neuropathy in the Rt leg.  I am also noticing some weakness in both of the arms maybe from taking the ozempic .  I may want to do more aquatic therapy.  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  I have tingling on the inner groin, knee and front of the leg.  Nothing really in the foot.   I am playing 9 holes of golf lately.  I walk at home.  I go back to the [employer] to ride the bike.    PERTINENT HISTORY: , atrial fibrillation, cardiac arrest s/p ICD, CAD, severe aortic regurgitation s/p AVR 10/2018 and aortic root replacement with CABGx1, aortic dilation, hypertension, hypothyroidism, type 2 diabetes, multiple myeloma, CKD IIIb-IV, migraine  PAIN:  PAIN:  Are you having pain? Yes NPRS scale: 5/10 Pain location: knee, distal thigh Pain orientation: Right  PAIN TYPE: dull, tight, and burning  Pain description: intermittent, burning and dull  Aggravating factors: maybe cold weather, transitioning from sit to stand Relieving factors: voltaren    PRECAUTIONS: Fall and ICD/Pacemaker  RED FLAGS: None   WEIGHT BEARING RESTRICTIONS: No  FALLS: Has patient fallen in last 6 months? No  LIVING ENVIRONMENT: Lives with: alone  Lives in: House/apartment Stairs: 4 steps to enter; 3 story home Has  following equipment at home: Shower bench, and raised commode  PLOF: Independent;  working part time as Ophthalmologist- requires sitting, standing, walking   PATIENT GOALS: improve R LE strength and bil UE strength   OBJECTIVE:  Note: Objective measures were completed at Evaluation unless otherwise noted.  DIAGNOSTIC FINDINGS: none recent  COGNITION: Overall cognitive status: Within functional limits for tasks assessed   SENSATION: Intact in B LEs to light touch   POSTURE: rounded shoulders and forward head  LOWER EXTREMITY ROM:     Active  Right Eval Left Eval  Hip flexion    Hip extension    Hip abduction    Hip adduction    Hip internal rotation    Hip external rotation    Knee flexion    Knee extension    Ankle dorsiflexion    Ankle plantarflexion    Ankle inversion    Ankle eversion     (Blank rows = not tested)  LOWER EXTREMITY MMT:    MMT Right Eval RIGHT  08/11/24 Left Eval LEFT 08/11/24  Hip flexion 3+ 4- 4 4+  Hip extension      Hip abduction 4+  4+   Hip adduction 4  4   Hip internal rotation 4+  4+   Hip external rotation 4-  4   Knee flexion 5 5 5 5   Knee extension 4- 5 4+ 5  Ankle dorsiflexion 5  5   Ankle plantarflexion 4+ 4+ 4+ 4+  Ankle inversion      Ankle eversion      (Blank rows = not tested)  UPPER EXTREMITY MMT:  MMT Right eval Left eval  Shoulder flexion 4 4  Shoulder extension    Shoulder abduction 4 4  Shoulder adduction    Shoulder extension    Shoulder internal rotation 5 5  Shoulder external rotation 5 5  Middle trapezius    Lower trapezius    Elbow flexion 4+ 5  Elbow extension 4+ 4+  Wrist flexion    Wrist extension    Wrist ulnar deviation    Wrist radial deviation    Wrist pronation    Wrist supination    Grip strength 58 40   (Blank rows = not tested)  GAIT: Gait pattern: Significant R-ward trunk lean with gait; slightly slowed gait speed  Assistive device utilized: None Level of assistance:  Modified independence  FUNCTIONAL TESTS:  5x sit to stand:   walk: able to walk 541ft stopping due to the Rt leg feeling tight.  SOB level 4/10 08/11/24: 6 min walk test - 704.7 ft with break after 3 minutes (437 ft due to SOB and Rt leg feeling tight), SOB level 3/10  12/15/2: 6 min walk test - 637 ft with break after 3 minutes , SOB level 2/10, Rt knee pain 6/10 (knee is the limitation today)  06/25/24 SL stance:  Rt: unable to perform without UE support; with  single arm support able to hold for 20 Lt: 3 seconds without UE support; 20 with single arm UE support  09/08/24 Rt:  4 sec Lt:  5  sec  Tandem stance:  Rt: 20 with single arm UE support  Lt: 30 without UE support     TODAY'S TREATMENT:   09/12/24: Therapeutic Exercises NuStep LE 10 level 4 x 8 min (LE 10/UE 12) After // bars for seated rest break:  Seated bil HS stretch x 2 reps, 20 sec holds Piriformis figure 4 stretch bil x 2 reps, 20 sec holds, reports tightness but able to perform with Rt knee feeling tight Standing calf stretch with foot on incline of // bars x 2 reps, 20 sec holds each Standing with foot in chair behind him for quad stretch and holding // bars x 1 reps, 20 sec holds each, pt reports not wanting to do second set due to knee discomfort Neuromuscular Re Ed In // bars continued without weight due to knee pain (flex, abd, seated rest, and then ext) 2 x 10 each with SLS on blue oval Therapeutic Activities 6 toe taps in // bars 2 x 10, pt did well with high step. Second set encouraged less support from UE's but still requires +2 HHA, seated rest after Calf/heel raise x 10, reports feeling increased soreness with Rt toe raise Glut taps against wall in standing for mini squat to work within knee pain tolerance (chair in front of pt for HHA for stability) 2 x 10, no increased pain.  Seated hip adduction with purple ball squeeze x 20, 5 sec holds Seated hip abd with yellow loop x  10   09/08/24 Rechecked walk test and goals, discussed POC Therapeutic Exercises NuStep LE 10 level 3 x Seated bil HS stretch (during rest break of hip 3 way raises) x 2 reps, x 20 sec holds Calf stretch 2x20 sec bil Neuromuscular Re Ed In // bars without weight due to knee pain (flex, abd, seated rest, and then ext) x 10 each with SLS  Therapeutic Activities Toe taps on 4 step today x 10 each Calf raise x 10   09/01/24 Therapeutic Exercises NuStep LE 9 level 4 x 3 min, then level 3 x 5 min for a total of 7 min - PT present to discuss current functional status Seated bil HS stretch (during rest break of hip 3 way raises) x 2 reps, x 20 sec holds Seated bil piriformis figure 4 stretch 2 reps, x 20 sec each returning therapist demo Standing with foot in chair behind him for quad stretch and holding // bars x 2 reps, 20 sec holds each Neuromuscular Re Ed In // bars with 3# added to each ankle for hip 3 way raises (flex, abd, seated rest, and then ext) x 10 each with SLS on blue oval pad for stability except for ext was done on level surface as pt was reporting increased hip fatigue Hip hiking standing on 4 step x 10 each returning therapist demo and tactile cues for correct technique, used mirror for feedback Therapeutic Activities Front 4 step ups with 3 SLS on step with each rep, 2 x 5 Toe taps on 4 step today (decreased step due to LE fatigue from new exs) x 10 each Seated rest after and then leg press at end of session  Calf raise x 5  No leg press due to fatigued legs   08/25/24: Therapeutic Exercises NuStep UE 12/ LE 9 level 4 x 2 min, then level 3 x 5 min for a total of 7 min - PTA present to discuss current functional status since last appt Seated bil HS stretch (  during rest break of hip 3 way raises) x 2 reps, x 20 sec holds, VC's for erect posture for most beneficial stretch Seated bil piriformis figure 4 stretch 2 reps, x 20 sec each returning therapist  demo Standing with foot in chair behind him for quad stretch and holding // bars x 2 reps, 20 sec holds each Neuromuscular Re Ed In // bars with 3# added to each ankle for hip 3 way raises (flex, abd, seated rest, and then ext) x 10 each with SLS on blue oval pad for stability except for ext was done on level surface as pt was reporting increased hip fatigue Hip hiking standing on 4 step x 10 each returning therapist demo and tactile cues for correct technique, very challenging on Rt > Lt side (Seated rest with above piriformis stretch) Therapeutic Activities Front 4 step ups with 3 SLS on step with each rep, 2 x 5 with tightness reported in Rt quad during Rt SLS (Standing above quad stretch), then seated rest after Toe taps on 4 step today (decreased step due to LE fatigue from new exs) x 10 each Seated rest after and then leg press at end of session  Leg Press 60# x 10, this was challenging for pt  PATIENT EDUCATION: Education details: per today's note Person educated: Patient Education method: Explanation, Demonstration, Tactile cues, Verbal cues, and Handouts Education comprehension: verbalized understanding and returned demonstration  HOME EXERCISE PROGRAM: Access Code: [email or web address] URL: [email or web address] Date: 06/23/2024 Prepared by: Saddie Raw  Exercises - Standing Hip Abduction with Counter Support  - 1 x daily - 5 x weekly - 2 sets - 10 reps - Side Stepping with Resistance at Ankles and Counter Support  - 1 x daily - 5 x weekly - 1 sets - 3-5 reps - Standing Hip Extension with Counter Support  - 1 x daily - 5 x weekly - 1-3 sets - 10 reps - 20-30 seconds hold - Seated Long Arc Quad with Ankle Weight  - 1 x daily - 5 x weekly - 2 sets - 10 reps - Sit to Stand  - 1 x daily - 5 x weekly - 2 sets - 10 reps - Seated Hamstring Stretch  - 1 x daily - 7 x weekly - 1-3 sets - 3 reps - 20-30 seconds hold  GOALS: Goals reviewed with patient? Yes  SHORT TERM GOALS:  Target date: 06/23/24/2024  Patient to be independent with initial HEP. Baseline: HEP initiated Goal status: MET from previous PT sessions    LONG TERM GOALS: Target date: 10/06/24  Patient to be independent with advanced HEP. Baseline: Not yet initiated  Goal status: In progress  Patient to demonstrate Rt LE strength >/=4/5.  Baseline: See above Goal status: MET  -    Pt will improve his walk test to being able to walk the full without rest and reaching at least 1056ft  Baseline: 544ft  Goal Status: In progress    ASSESSMENT:  CLINICAL IMPRESSION:  Pt still having increased Rt knee pain so adjusted exercises accordingly. Held off on leg press and ankle weights due to knee pain, however he was able to tolerate increased reps with hip 3 way raises and able to tolerate SLS on blue oval during this. Encouraged him to consider reaching out to an orthopedist if his knee pain doesn't start improving next week, pt agreed. Increased seated rests due to knee pain/discomfort.   OBJECTIVE IMPAIRMENTS: Abnormal gait, decreased balance, difficulty walking, decreased ROM,  decreased strength, impaired flexibility, impaired sensation, postural dysfunction, and pain.   ACTIVITY LIMITATIONS: carrying, lifting, bending, standing, squatting, stairs, transfers, bathing, toileting, dressing, locomotion level, and caring for others  PARTICIPATION LIMITATIONS: meal prep, cleaning, laundry, shopping, community activity, occupation, yard work, and church  PERSONAL FACTORS: Age, Past/current experiences, Time since onset of injury/illness/exacerbation, and 3+ comorbidities: HFrEF, atrial fibrillation, VF cardiac arrest s/p ICD, CAD, severe aortic regurgitation s/p AVR 10/2018 and aortic root replacement with CABGx1, aortic dilation, hypertension, hypothyroidism, type 2 diabetes, multiple myeloma, CKD IIIb-IV, migraine are also affecting patient's functional outcome.   REHAB POTENTIAL:  Good  CLINICAL DECISION MAKING: LOW  EVALUATION COMPLEXITY: LOW  PLAN:  PT FREQUENCY: 1-2x/week  PT DURATION: 4 weeks   PLANNED INTERVENTIONS: [id number]- PT Re-evaluation, 97110-Therapeutic exercises, 97530- Therapeutic activity, 97112- Neuromuscular re-education, 97535- Self Care, [id number]- Manual therapy, [phone]- Gait training, [phone]- Canalith repositioning, [phone]- Aquatic Therapy, Patient/Family education, Balance training, Stair training, Taping, Dry Needling, Joint mobilization, Spinal mobilization, Vestibular training, DME instructions, Cryotherapy, and Moist heat  PLAN FOR NEXT SESSION: Cont general strength like nustep, Rt LE general but hip focus, and general bil UE (no sig weakness here), Farmers carries with 5 lb KB to simulate walking with golf bag  Berwyn Knights, PTA 09/12/2024 12:04 PM          "

## 2024-09-15 ENCOUNTER — Other Ambulatory Visit: Payer: Self-pay

## 2024-09-15 ENCOUNTER — Ambulatory Visit: Attending: Cardiology

## 2024-09-15 DIAGNOSIS — I5042 Chronic combined systolic (congestive) and diastolic (congestive) heart failure: Secondary | ICD-10-CM

## 2024-09-15 NOTE — Patient Instructions (Signed)
 Follow up as scheduled.

## 2024-09-15 NOTE — Progress Notes (Signed)
 Pt seen in follow up post device generator change out with hematoma.  Pressure dressing removed.  Wound site improved.  Swelling has decreased.  No redness, warmth or bruising noted.  Pt advised ok to resume showering.  He will continue to monitor and call device clinic if any increased swelling noted.

## 2024-09-16 MED ORDER — DAPAGLIFLOZIN PROPANEDIOL 10 MG PO TABS: 10.0000 mg | ORAL_TABLET | Freq: Every day | ORAL | 3 refills | Status: AC

## 2024-09-26 ENCOUNTER — Encounter: Payer: Self-pay | Admitting: Podiatry

## 2024-09-26 ENCOUNTER — Ambulatory Visit (INDEPENDENT_AMBULATORY_CARE_PROVIDER_SITE_OTHER): Admitting: Podiatry

## 2024-09-26 DIAGNOSIS — B351 Tinea unguium: Secondary | ICD-10-CM | POA: Diagnosis not present

## 2024-09-26 DIAGNOSIS — M79675 Pain in left toe(s): Secondary | ICD-10-CM

## 2024-09-26 DIAGNOSIS — M79674 Pain in right toe(s): Secondary | ICD-10-CM | POA: Diagnosis not present

## 2024-09-26 DIAGNOSIS — E119 Type 2 diabetes mellitus without complications: Secondary | ICD-10-CM | POA: Diagnosis not present

## 2024-09-26 NOTE — Progress Notes (Signed)
 This patient returns to my office for at risk foot care.  This patient requires this care by a professional since this patient will be at risk due to having diabetes type 2, chronic kidney disease and coagulation defect.  Patient is taking eliquisThis patient is unable to cut nails himself since the patient cannot reach his nails.These nails are painful walking and wearing shoes.  This patient presents for at risk foot care today.  General Appearance  Alert, conversant and in no acute stress.  Vascular  Dorsalis pedis and posterior tibial  pulses are palpable  bilaterally.  Capillary return is within normal limits  bilaterally. Temperature is within normal limits  bilaterally.  Neurologic  Senn-Weinstein monofilament wire test within normal limits  bilaterally. Muscle power within normal limits bilaterally.  Nails Thick disfigured discolored nails with subungual debris  from hallux to fifth toes bilaterally. No evidence of bacterial infection or drainage bilaterally.  Orthopedic  No limitations of motion  feet .  No crepitus or effusions noted.  No bony pathology or digital deformities noted.  Skin  normotropic skin with no porokeratosis noted bilaterally.  No signs of infections or ulcers noted.     Onychomycosis  Pain in right toes  Pain in left toes  Consent was obtained for treatment procedures.   Mechanical debridement of nails 1-5  bilaterally performed with a nail nipper.  Filed with dremel without incident.    Return office visit   12 weeks                  Told patient to return for periodic foot care and evaluation due to potential at risk complications.   Ruffin Cotton DPM

## 2024-09-27 ENCOUNTER — Ambulatory Visit: Payer: Self-pay | Admitting: Cardiology

## 2024-09-29 ENCOUNTER — Ambulatory Visit: Attending: Internal Medicine

## 2024-09-29 ENCOUNTER — Ambulatory Visit: Payer: Self-pay | Admitting: Internal Medicine

## 2024-09-29 DIAGNOSIS — R2681 Unsteadiness on feet: Secondary | ICD-10-CM | POA: Insufficient documentation

## 2024-09-29 DIAGNOSIS — R2689 Other abnormalities of gait and mobility: Secondary | ICD-10-CM | POA: Diagnosis present

## 2024-09-29 DIAGNOSIS — M6281 Muscle weakness (generalized): Secondary | ICD-10-CM | POA: Insufficient documentation

## 2024-09-29 NOTE — Therapy (Signed)
 " OUTPATIENT PHYSICAL THERAPY TREATMENT   Patient Name: Eric [name]., MD MRN: [id number] DOB:[birth year], 74 y.o., male Today's Date: 09/29/2024   PCP:  Catheryn Slocumb, MD  REFERRING PROVIDER: Catheryn Slocumb, MD   END OF SESSION:  PT End of Session - 09/29/24 1211     Visit Number 14   add KX at 15   Number of Visits 20    Date for Recertification  10/06/24    Authorization Type none    PT Start Time 1208   pt arrived late   PT Stop Time 1255    PT Time Calculation (min) 47 min    Activity Tolerance Patient tolerated treatment well    Behavior During Therapy [facility] for tasks assessed/performed                   Past Medical History:  Diagnosis Date   Acute blood loss anemia    Acute encephalopathy    Acute hypoxemic respiratory failure (HCC)    Acute idiopathic gout of right ankle    Acute lumbar back pain    Acute on chronic systolic heart failure (HCC)    Acute respiratory failure (HCC)    AKI (acute kidney injury)    Altered mental status    Aortic aneurysm without rupture 02/09/2016   Aortic root enlargement 02/13/2012   Aortic valve regurgitation, acquired 02/13/2012   Arrhythmia    Atrial flutter (HCC) 03/18/2012   Back pain    Cardiac arrest (HCC) 02/01/2016   CHF (congestive heart failure) (HCC)    Chronic anticoagulation 03/04/2013   Chronic combined systolic and diastolic heart failure (HCC)    Chronic kidney disease    kidney fx studies increased    Chronic lower back pain    Chronic renal insufficiency    Chronic systolic heart failure (HCC) 02/13/2012   Recent diagnosis 4 / 2013, LVEF 25% by Echo 12/2011  03/2013: Echo at [facility] Conclusions: 1. Left ventricular ejection fraction estimated by 2D at 40-45 percent. 2. Mild concentric left ventricular hypertrophy. 3. Mild left atrial enlargement. 4. Moderate aortic valve regurgitation. 5. The aortic root at the sinus(es) of valsalva is moderately dilated 6. Mild mitral valve regurgitation.  7.   CKD (chronic kidney disease)    CKD (chronic kidney disease), stage IV (HCC) 08/06/2013   Creatinine 2.4 on 07/04/13    Claustrophobia    Colon cancer screening 03/04/2013   Debility 02/22/2016   Diabetes mellitus type 2 in nonobese [facility])    Diabetes mellitus type 2 in obese    Dysrhythmia    palpitations   Encounter for central line placement    Exertional dyspnea 01/2012   Femoral nerve injury 02/22/2016   Femoral neuropathy    Goals of care, counseling/discussion 05/05/2020   HCAP (healthcare-associated pneumonia)    Heart murmur    Hyperlipidemia 02/13/2012   Hypertension    Hypothyroidism    Internal hemorrhoids without mention of complication 04/11/2013   Labile blood pressure    Left bundle branch block 02/13/2012   Leg weakness, bilateral    Long term (current) use of anticoagulants 08/06/2013   Eliquis  therapy    Long term current use of amiodarone  08/07/2016   Lower extremity weakness    Migraine 02/13/2012   opthalmic   Multiple myeloma (HCC) 05/05/2020   Non-traumatic rhabdomyolysis    Obesity (BMI 30-39.9) 02/13/2012   Pain    Paroxysmal atrial fibrillation (HCC)    Pneumonia    Retroperitoneal bleed  Right ankle pain    Right knee pain    Severe aortic regurgitation 02/09/2016   Special screening for malignant neoplasms, colon 04/11/2013   Thyroid  activity decreased    Tibial pain    Varicose vein of leg    right   Ventricular fibrillation (HCC) 02/09/2016   Weakness of both lower extremities    Past Surgical History:  Procedure Laterality Date   AORTIC VALVE REPLACEMENT  11/20/2018   BIV ICD GENERATOR CHANGEOUT N/A 08/29/2024   Procedure: BIV ICD GENERATOR CHANGEOUT;  Surgeon: Waddell Danelle ORN, MD;  Location: [facility] INVASIVE CV LAB;  Service: Cardiovascular;  Laterality: N/A;   CARDIAC CATHETERIZATION N/A 02/17/2016   Procedure: Left Heart Cath and Coronary Angiography;  Surgeon: Candyce GORMAN Reek, MD;  Location: [facility] INVASIVE CV LAB;   Service: Cardiovascular;  Laterality: N/A;   CARDIOVERSION  03/22/2012   Procedure: CARDIOVERSION;  Surgeon: Oneil Parchment, MD;  Location: [facility] ENDOSCOPY;  Service: Cardiovascular;  Laterality: N/A;   CARDIOVERSION  04/19/2012   Procedure: CARDIOVERSION;  Surgeon: Victory ORN Claudene DOUGLAS, MD;  Location: [facility] OR;  Service: Cardiovascular;  Laterality: N/A;   COLONOSCOPY N/A 04/11/2013   Procedure: COLONOSCOPY;  Surgeon: Lamar JONETTA Aho, MD;  Location: [facility] ENDOSCOPY;  Service: Endoscopy;  Laterality: N/A;   COLONOSCOPY N/A 04/11/2013   Procedure: COLONOSCOPY;  Surgeon: Lamar JONETTA Aho, MD;  Location: [facility] ENDOSCOPY;  Service: Endoscopy;  Laterality: N/A;   EP IMPLANTABLE DEVICE N/A 02/17/2016   Procedure: BiV ICD Insertion CRT-D;  Surgeon: Danelle ORN Waddell, MD;  Location: [facility] INVASIVE CV LAB;  Service: Cardiovascular;  Laterality: N/A;   FINGER SURGERY  2012   4th digit right hand; thumb on left hand   RADIOLOGY WITH ANESTHESIA N/A 02/11/2016   Procedure: MRI OF THE BRAIN WITHOUT CONTRAST, LUMBAR WITHOUT CONTRAST;  Surgeon: Medication Radiologist, MD;  Location: [facility] OR;  Service: Radiology;  Laterality: N/A;  DR. WOOD/MRI   RIGHT/LEFT HEART CATH AND CORONARY ANGIOGRAPHY N/A 07/26/2018   Procedure: RIGHT/LEFT HEART CATH AND CORONARY ANGIOGRAPHY;  Surgeon: Claudene Victory ORN, MD;  Location: [facility] INVASIVE CV LAB;  Service: Cardiovascular;  Laterality: N/A;   Skin melanocytoma excision  2012   above left clavicle   STERNAL INCISION RECLOSURE  11/2018   STERNAL WIRE REMOVAL  11/2018   STERNAL WOUND DEBRIDEMENT  11/2018   TEE WITHOUT CARDIOVERSION  03/22/2012   Procedure: TRANSESOPHAGEAL ECHOCARDIOGRAM (TEE);  Surgeon: Oneil Parchment, MD;  Location: [facility] ENDOSCOPY;  Service: Cardiovascular;  Laterality: N/A;   TEE WITHOUT CARDIOVERSION N/A 02/08/2016   Procedure: TRANSESOPHAGEAL ECHOCARDIOGRAM (TEE);  Surgeon: Redell GORMAN Shallow, MD;  Location: [facility] ENDOSCOPY;  Service: Cardiovascular;  Laterality: N/A;   Patient Active Problem List    Diagnosis Date Noted   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   Umbilical hernia without obstruction and without gangrene 05/11/2024   Chalazion of right upper eyelid 05/11/2024   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 05/11/2024   Venous stasis 05/11/2024   Adjustment insomnia 01/01/2024   Presence of heart assist device (HCC) 12/31/2023   Enlarged prostate 09/10/2023   Dyslipidemia due to type 2 diabetes mellitus (HCC) 09/10/2023   Acquired thrombophilia 05/14/2023   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   Goals of care, counseling/discussion 05/05/2020   Multiple myeloma (HCC) 05/05/2020   S/P flap graft 01/07/2019   Coronary artery disease 01/07/2019   Long term (current) use of antibiotics 01/03/2019   Essential hypertension 12/27/2018   Postoperative infection of wound of sternum 12/27/2018  Decreased strength, endurance, and mobility 12/27/2018   Cardiac LV ejection fraction 30-35% 12/27/2018   Decreased activities of daily living (ADL) 12/27/2018   Klebsiella infection 12/27/2018   S/P AVR (aortic valve replacement) 11/23/2018   S/P ascending aortic aneurysm repair 11/23/2018   NICM (nonischemic cardiomyopathy) (HCC) 11/19/2018   AICD (automatic cardioverter/defibrillator) present 11/19/2018   Other symptoms and signs involving the musculoskeletal system 05/08/2018   Long term current use of amiodarone  08/07/2016   Type 2 diabetes mellitus with stage 3b chronic kidney disease, without long-term current use of insulin  (HCC)    Femoral neuropathy    Leg weakness, bilateral    PAF (paroxysmal atrial fibrillation) (HCC)    Thyroid  activity decreased    Arrhythmia    Long term current use of anticoagulant 03/04/2013   Atrial flutter (HCC) 03/18/2012    Class: Acute   Chronic combined systolic and diastolic CHF (congestive heart failure) (HCC) 02/13/2012    Class: Acute   Hypertension, accelerated 02/13/2012   Hyperlipidemia 02/13/2012   Left  bundle branch block 02/13/2012   Obesity (BMI 30-39.9) 02/13/2012    ONSET DATE:   REFERRING DIAG:  Diagnosis R53.1,Z74.09,R68.89 (ICD-10-CM) - Decreased strength, endurance, and mobility   THERAPY DIAG:  Unsteadiness on feet  Muscle weakness (generalized)  Other abnormalities of gait and mobility  Rationale for Evaluation and Treatment: Rehabilitation  SUBJECTIVE:                                                                                                                                                                                             SUBJECTIVE STATEMENT:  My knee is doing better, it just feels tight today.    Eval: I have peripheral neuropathy in the Rt leg.  I am also noticing some weakness in both of the arms maybe from taking the ozempic .  I may want to do more aquatic therapy.  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  I have tingling on the inner groin, knee and front of the leg.  Nothing really in the foot.   I am playing 9 holes of golf lately.  I walk at home.  I go back to the [employer] to ride the bike.    PERTINENT HISTORY: , atrial fibrillation, cardiac arrest s/p ICD, CAD, severe aortic regurgitation s/p AVR 10/2018 and aortic root replacement with CABGx1, aortic dilation, hypertension, hypothyroidism, type 2 diabetes, multiple myeloma, CKD IIIb-IV, migraine  PAIN:  PAIN:  Are you having pain? No, Rt knee just feels tight   PRECAUTIONS: Fall and ICD/Pacemaker  RED FLAGS: None   WEIGHT  BEARING RESTRICTIONS: No  FALLS: Has patient fallen in last 6 months? No  LIVING ENVIRONMENT: Lives with: alone  Lives in: House/apartment Stairs: 4 steps to enter; 3 story home Has following equipment at home: Shower bench, and raised commode  PLOF: Independent;  working part time as Ophthalmologist- requires sitting, standing, walking   PATIENT GOALS: improve R LE strength and bil UE strength   OBJECTIVE:   Note: Objective measures were completed at Evaluation unless otherwise noted.  DIAGNOSTIC FINDINGS: none recent  COGNITION: Overall cognitive status: Within functional limits for tasks assessed   SENSATION: Intact in B LEs to light touch   POSTURE: rounded shoulders and forward head  LOWER EXTREMITY ROM:     Active  Right Eval Left Eval  Hip flexion    Hip extension    Hip abduction    Hip adduction    Hip internal rotation    Hip external rotation    Knee flexion    Knee extension    Ankle dorsiflexion    Ankle plantarflexion    Ankle inversion    Ankle eversion     (Blank rows = not tested)  LOWER EXTREMITY MMT:    MMT Right Eval RIGHT  08/11/24 Left Eval LEFT 08/11/24  Hip flexion 3+ 4- 4 4+  Hip extension      Hip abduction 4+  4+   Hip adduction 4  4   Hip internal rotation 4+  4+   Hip external rotation 4-  4   Knee flexion 5 5 5 5   Knee extension 4- 5 4+ 5  Ankle dorsiflexion 5  5   Ankle plantarflexion 4+ 4+ 4+ 4+  Ankle inversion      Ankle eversion      (Blank rows = not tested)  UPPER EXTREMITY MMT:  MMT Right eval Left eval  Shoulder flexion 4 4  Shoulder extension    Shoulder abduction 4 4  Shoulder adduction    Shoulder extension    Shoulder internal rotation 5 5  Shoulder external rotation 5 5  Middle trapezius    Lower trapezius    Elbow flexion 4+ 5  Elbow extension 4+ 4+  Wrist flexion    Wrist extension    Wrist ulnar deviation    Wrist radial deviation    Wrist pronation    Wrist supination    Grip strength 58 40   (Blank rows = not tested)  GAIT: Gait pattern: Significant R-ward trunk lean with gait; slightly slowed gait speed  Assistive device utilized: None Level of assistance: Modified independence  FUNCTIONAL TESTS:  5x sit to stand:   walk: able to walk 526ft stopping due to the Rt leg feeling tight.  SOB level 4/10 08/11/24: 6 min walk test - 704.7 ft with break after 3 minutes (437 ft due to SOB  and Rt leg feeling tight), SOB level 3/10  12/15/2: 6 min walk test - 637 ft with break after 3 minutes , SOB level 2/10, Rt knee pain 6/10 (knee is the limitation today)  06/25/24 SL stance:  Rt: unable to perform without UE support; with  single arm support able to hold for 20 Lt: 3 seconds without UE support; 20 with single arm UE support  09/08/24 Rt:  4 sec Lt:  5 sec  Tandem stance:  Rt: 20 with single arm UE support  Lt: 30 without UE support     TODAY'S TREATMENT:   09/29/24: Therapeutic Exercises NuStep LE 10 level  4 x 8 min (LE 10/UE 12) Bil calf stretch holding // bars 2 reps, x 30 sec holds Standing with foot in chair behind him for quad stretch and holding // bars x 2 reps, 20 sec holds each; able to do 2 reps today without increased pain (After [employer]) Seated edge of chair for bil HS stretch x 2 reps, 20 sec Seated bil figure 4 piriformis stretch x 2 reps, 20 sec holds Neuromuscular Re Ed In // bars for bil hip 3 way raises (into flex, abd and ext) x 10 each with SLS on blue oval, seated rest after [employer] with 5 lb kettle bell to simulate golf bag carry around clinic, able to walk 335 ft before rest required due to feeling Rt leg tightening up Therapeutic Activities Seated hip abd with yellow loop x 12 Seated alt march with yellow loop around knees x 10 each  Glut taps against wall standing on purple balance pad for mini squat (chair in front of pt for HHA for safety but pt did not need to hold on today),   2 x 10 and pt with good technique, no knee discomfort. Seated rest after 6 step ups x 10 reps with 2-3 sec SLS on step each rep in // bars, bil LE's  09/12/24: Therapeutic Exercises NuStep LE 10 level 4 x 8 min (LE 10/UE 12) After // bars for seated rest break:  Seated bil HS stretch x 2 reps, 20 sec holds Piriformis figure 4 stretch bil x 2 reps, 20 sec holds, reports tightness but able to perform with Rt knee feeling tight Standing calf stretch  with foot on incline of // bars x 2 reps, 20 sec holds each Standing with foot in chair behind him for quad stretch and holding // bars x 1 reps, 20 sec holds each, pt reports not wanting to do second set due to knee discomfort Neuromuscular Re Ed In // bars continued without weight due to knee pain (flex, abd, seated rest, and then ext) 2 x 10 each with SLS on blue oval Therapeutic Activities 6 toe taps in // bars 2 x 10, pt did well with high step. Second set encouraged less support from UE's but still requires +2 HHA, seated rest after Calf/heel raise x 10, reports feeling increased soreness with Rt toe raise Glut taps against wall in standing for mini squat to work within knee pain tolerance (chair in front of pt for HHA for stability) 2 x 10, no increased pain.  Seated hip adduction with purple ball squeeze x 20, 5 sec holds Seated hip abd with yellow loop x 10   09/08/24 Rechecked walk test and goals, discussed POC Therapeutic Exercises NuStep LE 10 level 3 x Seated bil HS stretch (during rest break of hip 3 way raises) x 2 reps, x 20 sec holds Calf stretch 2x20 sec bil Neuromuscular Re Ed In // bars without weight due to knee pain (flex, abd, seated rest, and then ext) x 10 each with SLS  Therapeutic Activities Toe taps on 4 step today x 10 each Calf raise x 10       PATIENT EDUCATION: Education details: per today's note Person educated: Patient Education method: Explanation, Demonstration, Tactile cues, Verbal cues, and Handouts Education comprehension: verbalized understanding and returned demonstration  HOME EXERCISE PROGRAM: Access Code: [email or web address] URL: [email or web address] Date: 06/23/2024 Prepared by: Saddie Raw  Exercises - Standing Hip Abduction with Counter Support  - 1 x daily - 5 x  weekly - 2 sets - 10 reps - Side Stepping with Resistance at Ankles and Counter Support  - 1 x daily - 5 x weekly - 1 sets - 3-5 reps - Standing Hip  Extension with Counter Support  - 1 x daily - 5 x weekly - 1-3 sets - 10 reps - 20-30 seconds hold - Seated Long Arc Quad with Ankle Weight  - 1 x daily - 5 x weekly - 2 sets - 10 reps - Sit to Stand  - 1 x daily - 5 x weekly - 2 sets - 10 reps - Seated Hamstring Stretch  - 1 x daily - 7 x weekly - 1-3 sets - 3 reps - 20-30 seconds hold  GOALS: Goals reviewed with patient? Yes  SHORT TERM GOALS: Target date: 06/23/24/2024  Patient to be independent with initial HEP. Baseline: HEP initiated Goal status: MET from previous PT sessions    LONG TERM GOALS: Target date: 10/06/24  Patient to be independent with advanced HEP. Baseline: Not yet initiated  Goal status: In progress  Patient to demonstrate Rt LE strength >/=4/5.  Baseline: See above Goal status: MET  -    Pt will improve his walk test to being able to walk the full without rest and reaching at least 1072ft  Baseline: 550ft  Goal Status: In progress    ASSESSMENT:  CLINICAL IMPRESSION:  Pt reports knee pain is improved, just now feels a lot of tightness in knee. Continued with NuStep and bil LE flexibility but added farmers carry with 5 lb kettle bell which pt was challenged by. He reports biggest issue is going up/down stairs due to endurance and LE strength so continued with step ups and he was able to tolerate 6 step today without increased pain, just reports Rt quad feeling tight after reps.   OBJECTIVE IMPAIRMENTS: Abnormal gait, decreased balance, difficulty walking, decreased ROM, decreased strength, impaired flexibility, impaired sensation, postural dysfunction, and pain.   ACTIVITY LIMITATIONS: carrying, lifting, bending, standing, squatting, stairs, transfers, bathing, toileting, dressing, locomotion level, and caring for others  PARTICIPATION LIMITATIONS: meal prep, cleaning, laundry, shopping, community activity, occupation, yard work, and church  PERSONAL FACTORS: Age, Past/current experiences, Time  since onset of injury/illness/exacerbation, and 3+ comorbidities: HFrEF, atrial fibrillation, VF cardiac arrest s/p ICD, CAD, severe aortic regurgitation s/p AVR 10/2018 and aortic root replacement with CABGx1, aortic dilation, hypertension, hypothyroidism, type 2 diabetes, multiple myeloma, CKD IIIb-IV, migraine are also affecting patient's functional outcome.   REHAB POTENTIAL: Good  CLINICAL DECISION MAKING: LOW  EVALUATION COMPLEXITY: LOW  PLAN:  PT FREQUENCY: 1-2x/week  PT DURATION: 4 weeks   PLANNED INTERVENTIONS: [id number]- PT Re-evaluation, 97110-Therapeutic exercises, 97530- Therapeutic activity, 97112- Neuromuscular re-education, 97535- Self Care, [id number]- Manual therapy, [phone]- Gait training, [phone]- Canalith repositioning, [phone]- Aquatic Therapy, Patient/Family education, Balance training, Stair training, Taping, Dry Needling, Joint mobilization, Spinal mobilization, Vestibular training, DME instructions, Cryotherapy, and Moist heat  PLAN FOR NEXT SESSION: Resume leg press if his knee pain is still decreased; Cont general strength like nustep, Rt LE general but hip focus, and general bil UE (no sig weakness here), Cont Farmers carries with 5 lb KB to simulate walking with golf bag, progress distance as he tolerates  Berwyn Knights, PTA 09/29/2024 1:02 PM  "

## 2024-10-01 ENCOUNTER — Encounter (HOSPITAL_BASED_OUTPATIENT_CLINIC_OR_DEPARTMENT_OTHER): Payer: Self-pay | Admitting: Cardiovascular Disease

## 2024-10-01 ENCOUNTER — Ambulatory Visit (INDEPENDENT_AMBULATORY_CARE_PROVIDER_SITE_OTHER): Admitting: Cardiovascular Disease

## 2024-10-01 VITALS — BP 126/94 | HR 71 | Ht 68.5 in | Wt 199.9 lb

## 2024-10-01 DIAGNOSIS — Z01818 Encounter for other preprocedural examination: Secondary | ICD-10-CM | POA: Diagnosis not present

## 2024-10-01 DIAGNOSIS — I428 Other cardiomyopathies: Secondary | ICD-10-CM

## 2024-10-01 DIAGNOSIS — I484 Atypical atrial flutter: Secondary | ICD-10-CM

## 2024-10-01 DIAGNOSIS — I10 Essential (primary) hypertension: Secondary | ICD-10-CM | POA: Diagnosis not present

## 2024-10-01 DIAGNOSIS — I5042 Chronic combined systolic (congestive) and diastolic (congestive) heart failure: Secondary | ICD-10-CM

## 2024-10-01 DIAGNOSIS — E782 Mixed hyperlipidemia: Secondary | ICD-10-CM | POA: Diagnosis not present

## 2024-10-01 DIAGNOSIS — I480 Paroxysmal atrial fibrillation: Secondary | ICD-10-CM | POA: Diagnosis not present

## 2024-10-01 DIAGNOSIS — Q2543 Congenital aneurysm of aorta: Secondary | ICD-10-CM

## 2024-10-01 NOTE — Progress Notes (Signed)
 " Cardiology Office Note:  .   Date:  10/01/2024  ID:  Eric [name]., MD, DOB 1951-03-13, MRN [id number] PCP: Jarold Medici, MD  [facility] Providers Cardiologist:  Annabella Scarce, MD Electrophysiologist:  Danelle Birmingham, MD    History of Present Illness: .    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 male with HFrEF, atrial fibrillation, aortic root aneurysm, VF cardiac arrest s/p ICD, CAD, severe aortic regurgitation s/p AVR 10/2018 and aortic root replacement with CABGx1, aortic dilation, hypertension, hypothyroidism, type 2 diabetes, multiple myeloma, CKD IIIb-IV, who presents for follow-up today. He is a practicing ophthalmologist   Prior ventricular fibrillation cardiac arrest in 11-01-15 at which time CPR was provided by his wife with residual RLE numbness and peripheral retinopathy. ICD placed at that time (follows with Dr. Birmingham). He followed routinely with Dr. Claudene regarding aortic regurgitation, and on 11/20/2018 underwent aortic valve replacement and aortic root replacement with bioprosthetic valve/aortic graft and single-vessel coronary grafting with saphenous vein.  Subsequent sternal wound infection required sternal flap and prolonged antibiotics.  At visit 12/2021 SGLT2i was added.     At his last visit with Dr. Claudene 06/19/2022 he was recommended for updated echo. Echo performed 08/25/2022 showed LVEF 30-35%, RWMA, LV moderately dilated, RV moderately enlarged, moderately elevated PASP, bilateral atria severely dilated, bioprosthetic aortic valve with normal structure and function, moderate dilation aortic root 48 mm, ascending aorta 38 mm.   He was seen by Reche Finder, NP 02/23/2023. Home blood pressures were mostly in the 130s systolic. When on chemotherapy he would retain fluid and require Lasix  80mg  QD. They reduced his Lasix  from 80mg  every day, to 80mg  and 40mg  QOD for protection of renal function.    At his visit 06/2023 he was feeling well.  BP was running low and he noted  dizziness BiDil  so he was not using it regularly.  This was discontinued.  He was referred to water physical therapy.  His wife passed away in 11-01-2023.  At his visit 07/2024 he noted increased LE edema.  He wasn't able to take lasix  when at work.    His BiV generator was changed out 08/2024.  Discussed the use of AI scribe software for clinical note transcription with the patient, who gave verbal consent to proceed.  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 These episodes seem to decrease when he takes one Eliquis  instead of two. He uses saline and Vaseline for relief during these episodes.  He monitors his blood pressure at home, which is generally controlled with carvedilol  and olmesartan. He experiences leg swelling and has tried compression socks, which seem to help. He takes Lasix  once after work on workdays and twice on non-workdays, although he sometimes misses doses. No orthopnea, but he reports leg tightness and fatigue when climbing stairs.  He is currently engaging in physical therapy and has stopped water exercises due to cold weather. He reports stiffness in his knee and back following PT sessions. He is working on increasing his walking distance and notes improvement, although he experiences leg tightness and fatigue when climbing stairs.  He experiences difficulty sleeping, often due to listening to the news late at night. He takes temazepam , which usually helps him sleep, but he still struggles with sleep quality. He attributes some sleep issues to work-related stress and diuretic use.  He mentions having an umbilical hernia that does not cause  significant discomfort but affects the fit of his clothes and seems to be enlarging. He has not yet consulted a surgeon about this issue.  ROS:  As per HPI  Studies Reviewed: .       Echo 07/01/24 ([facility]): MODERATE LEFT  VENTRICULAR SYSTOLIC DYSFUNCTION WITH MILD LVH, LV SIZE IS MODERATELY ENLARGED  ESTIMATED EF: 35%, CALC EF(3D): 41%  DIASTOLIC FUNCTION CAN'T BE DETERMINED  MILD RIGHT VENTRICULAR SYSTOLIC DYSFUNCTION  VALVULAR REGURGITATION: No AR, MODERATE MR, MILD PR, MODERATE TR  ESTIMATED RVSP: 45 mmHg (ABNORMAL)  VALVULAR STENOSIS: BIOPROSTHETIC AoV, No MS, No PS, No TS   Cardiac CT-A 07/01/24: Impression:   The sinuses of Valsalva measures up to 45 x 47 x 50 mm, unchanged compared  to 05/24/2021. Mild increase in tubular ascending thoracic aorta measuring  40 x 38 mm, previously measuring 38 x 36 mm from 05/24/2021.   Risk Assessment/Calculations:    CHA2DS2-VASc Score = 5   This indicates a 7.2% annual risk of stroke. The patient's score is based upon: CHF History: 1 HTN History: 1 Diabetes History: 1 Stroke History: 0 Vascular Disease History: 1 Age Score: 1 Gender Score: 0    HYPERTENSION CONTROL Vitals:   10/01/24 0933 10/01/24 1020  BP: (!) 128/98 (!) 126/94    The patient's blood pressure is elevated above target today.  In order to address the patient's elevated BP: Blood pressure will be monitored at home to determine if medication changes need to be made.          Physical Exam:   VS:  BP (!) 126/94   Pulse 71   Ht 5' 8.5 (1.74 m)   Wt 199 lb 14.4 oz (90.7 kg)   SpO2 98%   BMI 29.95 kg/m  , BMI Body mass index is 29.95 kg/m. GENERAL:  Well appearing HEENT: Pupils equal round and reactive, fundi not visualized, oral mucosa unremarkable NECK:  + jugular venous distention to the upper neck sitting upright, waveform within normal limits, carotid upstroke brisk and symmetric, no bruits, no thyromegaly LUNGS:  Clear to auscultation bilaterally HEART:  Regularly irregular.  PMI not displaced or sustained,S1 and S2 within normal limits, no S3, no S4, no clicks, no rubs, III/VI systolic murmur at the LUSB ABD:  Flat, positive bowel sounds normal in frequency in pitch, no  bruits, no rebound, no guarding, no midline pulsatile mass, no hepatomegaly, no splenomegaly EXT:  2 plus pulses throughout, 2+ LE edema to the knee bilaterally, no cyanosis no clubbing SKIN:  No rashes no nodules NEURO:  Cranial nerves II through XII grossly intact, motor grossly intact throughout PSYCH:  Cognitively intact, oriented to person place and time   ASSESSMENT AND PLAN: .    Assessment & Plan # Acute on chronic combined systolic and diastolic heart failure (EF 35-40%): # Moderate MR/TR:  Chronic heart failure with preserved ejection fraction (EF 35-40%). Fluid retention likely contributing to symptoms. Echocardiogram shows stable heart function and no worsening of mitral valve regurgitation. - Increase Lasix  to 120 mg BID on non-work days for two days. - Continue Lasix  80 mg daily on work days, but try to get an additional dose in if possible. - Encouraged use of compression socks. - Ordered basic metabolic panel in two weeks. - Scheduled stress test before potential hernia surgery. - Continue carvedilol , olmesartan and Farxiga .  # s/p Bioprosthetic AVR:  Stable on echo 06/2024. - Continue current management and monitoring.  # Aortic root aneurysm Well-managed with no change in  aortic size on CT at due 06/2024.  He has an MRI compatible CRT-D, so can get MRI for monitoring given his CKD.  # Essential hypertension Well-controlled with current medication regimen.  He hasn't taken his AM dose yets. - Continue current antihypertensive regimen with carvedilol  and olmesartan.  # PAF/atrial flutter: Continue Eliquis , amiodarone , and carvedilol .  Advised take Eliquis  BID and use nasal saline for nosebleeds.  If they persist, see ENT.   # Pre-operative risk assessment:  He is interested in getting his umbilical hernia repaired.  He is unable to get up a flight of stairs without getting short of breath. We will get a Lexiscan Myoview for pre-operative risk assessment.   # VF s/p  cardiac arrest:  S/p ICD. Recently had his CRT-D changed out.  [employer].  Continue amiodarone .   # CAD:  # Hyperlipidemia:  Continue atorvastatin .   Dispo: f/u in 2 months.  Signed, Annabella Scarce, MD   "

## 2024-10-01 NOTE — Patient Instructions (Signed)
 Medication Instructions:  INCREASE YOUR FUROSEMIDE  TO 120 MG FOR 2 DAYS THEN GO BACK TO 80 MG   *If you need a refill on your cardiac medications before your next appointment, please call your pharmacy*  Lab Work: BMET IN 2 WEEKS  If you have labs (blood work) drawn today and your tests are completely normal, you will receive your results only by: MyChart Message (if you have MyChart) OR A paper copy in the mail If you have any lab test that is abnormal or we need to change your treatment, we will call you to review the results.  Testing/Procedures: Your physician has requested that you have a lexiscan myoview. For further information please visit [email or web address] Please follow instruction sheet, as given.  Follow-Up: At [facility], you and your health needs are our priority.  As part of our continuing mission to provide you with exceptional heart care, our providers are all part of one team.  This team includes your primary Cardiologist (physician) and Advanced Practice Providers or APPs (Physician Assistants and Nurse Practitioners) who all work together to provide you with the care you need, when you need it.  Your next appointment:   2 month(s)  Provider:   Annabella Scarce, MD    We recommend signing up for the patient portal called MyChart.  Sign up information is provided on this After Visit Summary.  MyChart is used to connect with patients for Virtual Visits (Telemedicine).  Patients are able to view lab/test results, encounter notes, upcoming appointments, etc.  Non-urgent messages can be sent to your provider as well.   To learn more about what you can do with MyChart, go to [email or web address].

## 2024-10-03 ENCOUNTER — Ambulatory Visit: Admitting: Rehabilitation

## 2024-10-03 ENCOUNTER — Encounter: Payer: Self-pay | Admitting: Rehabilitation

## 2024-10-03 DIAGNOSIS — R2681 Unsteadiness on feet: Secondary | ICD-10-CM

## 2024-10-03 DIAGNOSIS — M6281 Muscle weakness (generalized): Secondary | ICD-10-CM

## 2024-10-03 DIAGNOSIS — R2689 Other abnormalities of gait and mobility: Secondary | ICD-10-CM

## 2024-10-03 NOTE — Therapy (Signed)
 " OUTPATIENT PHYSICAL THERAPY TREATMENT   Patient Name: Eric [name]., MD MRN: [id number] DOB:[birth year], 74 y.o., male Today's Date: 10/03/2024   PCP:  Catheryn Slocumb, MD  REFERRING PROVIDER: Catheryn Slocumb, MD   END OF SESSION:  PT End of Session - 10/03/24 1003     Visit Number 15   KX   Number of Visits 20    Date for Recertification  10/06/24                   Past Medical History:  Diagnosis Date   Acute blood loss anemia    Acute encephalopathy    Acute hypoxemic respiratory failure (HCC)    Acute idiopathic gout of right ankle    Acute lumbar back pain    Acute on chronic systolic heart failure (HCC)    Acute respiratory failure (HCC)    AKI (acute kidney injury)    Altered mental status    Aortic aneurysm without rupture 02/09/2016   Aortic root enlargement 02/13/2012   Aortic valve regurgitation, acquired 02/13/2012   Arrhythmia    Atrial flutter (HCC) 03/18/2012   Back pain    Cardiac arrest (HCC) 02/01/2016   CHF (congestive heart failure) (HCC)    Chronic anticoagulation 03/04/2013   Chronic combined systolic and diastolic heart failure (HCC)    Chronic kidney disease    kidney fx studies increased    Chronic lower back pain    Chronic renal insufficiency    Chronic systolic heart failure (HCC) 02/13/2012   Recent diagnosis 4 / 2013, LVEF 25% by Echo 12/2011  03/2013: Echo at [facility] Conclusions: 1. Left ventricular ejection fraction estimated by 2D at 40-45 percent. 2. Mild concentric left ventricular hypertrophy. 3. Mild left atrial enlargement. 4. Moderate aortic valve regurgitation. 5. The aortic root at the sinus(es) of valsalva is moderately dilated 6. Mild mitral valve regurgitation. 7.   CKD (chronic kidney disease)    CKD (chronic kidney disease), stage IV (HCC) 08/06/2013   Creatinine 2.4 on 07/04/13    Claustrophobia    Colon cancer screening 03/04/2013   Debility 02/22/2016   Diabetes mellitus type 2 in nonobese [facility])     Diabetes mellitus type 2 in obese    Dysrhythmia    palpitations   Encounter for central line placement    Exertional dyspnea 01/2012   Femoral nerve injury 02/22/2016   Femoral neuropathy    Goals of care, counseling/discussion 05/05/2020   HCAP (healthcare-associated pneumonia)    Heart murmur    Hyperlipidemia 02/13/2012   Hypertension    Hypothyroidism    Internal hemorrhoids without mention of complication 04/11/2013   Labile blood pressure    Left bundle branch block 02/13/2012   Leg weakness, bilateral    Long term (current) use of anticoagulants 08/06/2013   Eliquis  therapy    Long term current use of amiodarone  08/07/2016   Lower extremity weakness    Migraine 02/13/2012   opthalmic   Multiple myeloma (HCC) 05/05/2020   Non-traumatic rhabdomyolysis    Obesity (BMI 30-39.9) 02/13/2012   Pain    Paroxysmal atrial fibrillation (HCC)    Pneumonia    Retroperitoneal bleed    Right ankle pain    Right knee pain    Severe aortic regurgitation 02/09/2016   Special screening for malignant neoplasms, colon 04/11/2013   Thyroid  activity decreased    Tibial pain    Varicose vein of leg    right   Ventricular fibrillation (HCC) 02/09/2016  Weakness of both lower extremities    Past Surgical History:  Procedure Laterality Date   AORTIC VALVE REPLACEMENT  11/20/2018   BIV ICD GENERATOR CHANGEOUT N/A 08/29/2024   Procedure: BIV ICD GENERATOR CHANGEOUT;  Surgeon: Waddell Danelle ORN, MD;  Location: [facility] INVASIVE CV LAB;  Service: Cardiovascular;  Laterality: N/A;   CARDIAC CATHETERIZATION N/A 02/17/2016   Procedure: Left Heart Cath and Coronary Angiography;  Surgeon: Candyce GORMAN Reek, MD;  Location: [facility] INVASIVE CV LAB;  Service: Cardiovascular;  Laterality: N/A;   CARDIOVERSION  03/22/2012   Procedure: CARDIOVERSION;  Surgeon: Oneil Parchment, MD;  Location: [facility] ENDOSCOPY;  Service: Cardiovascular;  Laterality: N/A;   CARDIOVERSION  04/19/2012   Procedure: CARDIOVERSION;  Surgeon:  Victory ORN Claudene DOUGLAS, MD;  Location: [facility] OR;  Service: Cardiovascular;  Laterality: N/A;   COLONOSCOPY N/A 04/11/2013   Procedure: COLONOSCOPY;  Surgeon: Lamar JONETTA Aho, MD;  Location: [facility] ENDOSCOPY;  Service: Endoscopy;  Laterality: N/A;   COLONOSCOPY N/A 04/11/2013   Procedure: COLONOSCOPY;  Surgeon: Lamar JONETTA Aho, MD;  Location: [facility] ENDOSCOPY;  Service: Endoscopy;  Laterality: N/A;   EP IMPLANTABLE DEVICE N/A 02/17/2016   Procedure: BiV ICD Insertion CRT-D;  Surgeon: Danelle ORN Waddell, MD;  Location: [facility] INVASIVE CV LAB;  Service: Cardiovascular;  Laterality: N/A;   FINGER SURGERY  2012   4th digit right hand; thumb on left hand   RADIOLOGY WITH ANESTHESIA N/A 02/11/2016   Procedure: MRI OF THE BRAIN WITHOUT CONTRAST, LUMBAR WITHOUT CONTRAST;  Surgeon: Medication Radiologist, MD;  Location: [facility] OR;  Service: Radiology;  Laterality: N/A;  DR. WOOD/MRI   RIGHT/LEFT HEART CATH AND CORONARY ANGIOGRAPHY N/A 07/26/2018   Procedure: RIGHT/LEFT HEART CATH AND CORONARY ANGIOGRAPHY;  Surgeon: Claudene Victory ORN, MD;  Location: [facility] INVASIVE CV LAB;  Service: Cardiovascular;  Laterality: N/A;   Skin melanocytoma excision  2012   above left clavicle   STERNAL INCISION RECLOSURE  11/2018   STERNAL WIRE REMOVAL  11/2018   STERNAL WOUND DEBRIDEMENT  11/2018   TEE WITHOUT CARDIOVERSION  03/22/2012   Procedure: TRANSESOPHAGEAL ECHOCARDIOGRAM (TEE);  Surgeon: Oneil Parchment, MD;  Location: [facility] ENDOSCOPY;  Service: Cardiovascular;  Laterality: N/A;   TEE WITHOUT CARDIOVERSION N/A 02/08/2016   Procedure: TRANSESOPHAGEAL ECHOCARDIOGRAM (TEE);  Surgeon: Redell GORMAN Shallow, MD;  Location: [facility] ENDOSCOPY;  Service: Cardiovascular;  Laterality: N/A;   Patient Active Problem List   Diagnosis Date Noted   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   Umbilical hernia without obstruction and without gangrene 05/11/2024   Chalazion of right upper eyelid 05/11/2024   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 05/11/2024    Venous stasis 05/11/2024   Adjustment insomnia 01/01/2024   Presence of heart assist device (HCC) 12/31/2023   Enlarged prostate 09/10/2023   Dyslipidemia due to type 2 diabetes mellitus (HCC) 09/10/2023   Acquired thrombophilia 05/14/2023   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   Goals of care, counseling/discussion 05/05/2020   Multiple myeloma (HCC) 05/05/2020   S/P flap graft 01/07/2019   Coronary artery disease 01/07/2019   Long term (current) use of antibiotics 01/03/2019   Essential hypertension 12/27/2018   Postoperative infection of wound of sternum 12/27/2018   Decreased strength, endurance, and mobility 12/27/2018   Cardiac LV ejection fraction 30-35% 12/27/2018   Decreased activities of daily living (ADL) 12/27/2018   Klebsiella infection 12/27/2018   S/P AVR (aortic valve replacement) 11/23/2018   S/P ascending aortic aneurysm repair 11/23/2018   NICM (nonischemic cardiomyopathy) (HCC) 11/19/2018  AICD (automatic cardioverter/defibrillator) present 11/19/2018   Other symptoms and signs involving the musculoskeletal system 05/08/2018   Long term current use of amiodarone  08/07/2016   Type 2 diabetes mellitus with stage 3b chronic kidney disease, without long-term current use of insulin  (HCC)    Femoral neuropathy    Leg weakness, bilateral    PAF (paroxysmal atrial fibrillation) (HCC)    Thyroid  activity decreased    Arrhythmia    Long term current use of anticoagulant 03/04/2013   Atrial flutter (HCC) 03/18/2012    Class: Acute   Chronic combined systolic and diastolic CHF (congestive heart failure) (HCC) 02/13/2012    Class: Acute   Hypertension, accelerated 02/13/2012   Hyperlipidemia 02/13/2012   Left bundle branch block 02/13/2012   Obesity (BMI 30-39.9) 02/13/2012    ONSET DATE:   REFERRING DIAG:  Diagnosis R53.1,Z74.09,R68.89 (ICD-10-CM) - Decreased strength, endurance, and mobility   THERAPY DIAG:  Unsteadiness on feet  Muscle weakness  (generalized)  Other abnormalities of gait and mobility  Rationale for Evaluation and Treatment: Rehabilitation  SUBJECTIVE:                                                                                                                                                                                             SUBJECTIVE STATEMENT:  Now my left knee is achy.  It was intermittent over a few days.   Eval: I have peripheral neuropathy in the Rt leg.  I am also noticing some weakness in both of the arms maybe from taking the ozempic .  I may want to do more aquatic therapy.  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  I have tingling on the inner groin, knee and front of the leg.  Nothing really in the foot.   I am playing 9 holes of golf lately.  I walk at home.  I go back to the [employer] to ride the bike.    PERTINENT HISTORY: , atrial fibrillation, cardiac arrest s/p ICD, CAD, severe aortic regurgitation s/p AVR 10/2018 and aortic root replacement with CABGx1, aortic dilation, hypertension, hypothyroidism, type 2 diabetes, multiple myeloma, CKD IIIb-IV, migraine  PAIN:  PAIN:  Are you having pain? 3/10 Lt knee   PRECAUTIONS: Fall and ICD/Pacemaker  RED FLAGS: None   WEIGHT BEARING RESTRICTIONS: No  FALLS: Has patient fallen in last 6 months? No  LIVING ENVIRONMENT: Lives with: alone  Lives in: House/apartment Stairs: 4 steps to enter; 3 story home Has following equipment at home: Shower bench, and raised commode  PLOF: Independent;  working part time as Ophthalmologist- requires sitting, standing,  walking   PATIENT GOALS: improve R LE strength and bil UE strength   OBJECTIVE:  Note: Objective measures were completed at Evaluation unless otherwise noted.  DIAGNOSTIC FINDINGS: none recent  COGNITION: Overall cognitive status: Within functional limits for tasks assessed   SENSATION: Intact in B LEs to light touch   POSTURE: rounded  shoulders and forward head  LOWER EXTREMITY ROM:     Active  Right Eval Left Eval  Hip flexion    Hip extension    Hip abduction    Hip adduction    Hip internal rotation    Hip external rotation    Knee flexion    Knee extension    Ankle dorsiflexion    Ankle plantarflexion    Ankle inversion    Ankle eversion     (Blank rows = not tested)  LOWER EXTREMITY MMT:    MMT Right Eval RIGHT  08/11/24 Left Eval LEFT 08/11/24  Hip flexion 3+ 4- 4 4+  Hip extension      Hip abduction 4+  4+   Hip adduction 4  4   Hip internal rotation 4+  4+   Hip external rotation 4-  4   Knee flexion 5 5 5 5   Knee extension 4- 5 4+ 5  Ankle dorsiflexion 5  5   Ankle plantarflexion 4+ 4+ 4+ 4+  Ankle inversion      Ankle eversion      (Blank rows = not tested)  UPPER EXTREMITY MMT:  MMT Right eval Left eval  Shoulder flexion 4 4  Shoulder extension    Shoulder abduction 4 4  Shoulder adduction    Shoulder extension    Shoulder internal rotation 5 5  Shoulder external rotation 5 5  Middle trapezius    Lower trapezius    Elbow flexion 4+ 5  Elbow extension 4+ 4+  Wrist flexion    Wrist extension    Wrist ulnar deviation    Wrist radial deviation    Wrist pronation    Wrist supination    Grip strength 58 40   (Blank rows = not tested)  GAIT: Gait pattern: Significant R-ward trunk lean with gait; slightly slowed gait speed  Assistive device utilized: None Level of assistance: Modified independence  FUNCTIONAL TESTS:  5x sit to stand:   walk: able to walk 561ft stopping due to the Rt leg feeling tight.  SOB level 4/10 08/11/24: 6 min walk test - 704.7 ft with break after 3 minutes (437 ft due to SOB and Rt leg feeling tight), SOB level 3/10  12/15/2: 6 min walk test - 637 ft with break after 3 minutes , SOB level 2/10, Rt knee pain 6/10 (knee is the limitation today)  06/25/24 SL stance:  Rt: unable to perform without UE support; with  single arm support able  to hold for 20 Lt: 3 seconds without UE support; 20 with single arm UE support  09/08/24 Rt:  4 sec Lt:  5 sec  Tandem stance:  Rt: 20 with single arm UE support  Lt: 30 without UE support     TODAY'S TREATMENT:   10/03/24: Therapeutic Exercises -NuStep LE 10 level 4 x 8 min (LE 10/UE 12) -Supine LTR due to increased back pain due after nustep -SKTC  -Supine ball squeeze 3 x 15 -Hooklying band abduction green x 15 -bridge x 10 -Bil calf stretch holding // bars 2 reps, x 30 sec holds -Seated edge of chair for bil HS  stretch x 2 reps, 20 sec (try supine next time due to increased back pain) Neuromuscular Re Ed -In // bars for bil hip 3 way raises (into flex, abd and ext) x 10 each with SLS no oval -[place] with 5 lb kettle bell to simulate golf bag carry around clinic, able to walk 335 ft before rest required due to feeling Rt leg tightening up  09/29/24: Therapeutic Exercises NuStep LE 10 level 4 x 8 min (LE 10/UE 12) Bil calf stretch holding // bars 2 reps, x 30 sec holds Standing with foot in chair behind him for quad stretch and holding // bars x 2 reps, 20 sec holds each; able to do 2 reps today without increased pain (After Sara Lee) Seated edge of chair for bil HS stretch x 2 reps, 20 sec Seated bil figure 4 piriformis stretch x 2 reps, 20 sec holds Neuromuscular Re Ed In // bars for bil hip 3 way raises (into flex, abd and ext) x 10 each with SLS on blue oval, seated rest after [place] with 5 lb kettle bell to simulate golf bag carry around clinic, able to walk 335 ft before rest required due to feeling Rt leg tightening up Therapeutic Activities Seated hip abd with yellow loop x 12 Seated alt march with yellow loop around knees x 10 each  Glut taps against wall standing on purple balance pad for mini squat (chair in front of pt for HHA for safety but pt did not need to hold on today),   2 x 10 and pt with good technique, no knee discomfort. Seated rest  after 6 step ups x 10 reps with 2-3 sec SLS on step each rep in // bars, bil LE's  09/12/24: Therapeutic Exercises NuStep LE 10 level 4 x 8 min (LE 10/UE 12) After // bars for seated rest break:  Seated bil HS stretch x 2 reps, 20 sec holds Piriformis figure 4 stretch bil x 2 reps, 20 sec holds, reports tightness but able to perform with Rt knee feeling tight Standing calf stretch with foot on incline of // bars x 2 reps, 20 sec holds each Standing with foot in chair behind him for quad stretch and holding // bars x 1 reps, 20 sec holds each, pt reports not wanting to do second set due to knee discomfort Neuromuscular Re Ed In // bars continued without weight due to knee pain (flex, abd, seated rest, and then ext) 2 x 10 each with SLS on blue oval Therapeutic Activities 6 toe taps in // bars 2 x 10, pt did well with high step. Second set encouraged less support from UE's but still requires +2 HHA, seated rest after Calf/heel raise x 10, reports feeling increased soreness with Rt toe raise Glut taps against wall in standing for mini squat to work within knee pain tolerance (chair in front of pt for HHA for stability) 2 x 10, no increased pain.  Seated hip adduction with purple ball squeeze x 20, 5 sec holds Seated hip abd with yellow loop x 10  PATIENT EDUCATION: Education details: per today's note Person educated: Patient Education method: Explanation, Demonstration, Tactile cues, Verbal cues, and Handouts Education comprehension: verbalized understanding and returned demonstration  HOME EXERCISE PROGRAM: Access Code: [email or web address] URL: [email or web address] Date: 06/23/2024 Prepared by: Saddie Raw  Exercises - Standing Hip Abduction with Counter Support  - 1 x daily - 5 x weekly - 2 sets - 10 reps - Side Stepping with Resistance at  Ankles and Counter Support  - 1 x daily - 5 x weekly - 1 sets - 3-5 reps - Standing Hip Extension with Counter Support  - 1 x daily - 5 x  weekly - 1-3 sets - 10 reps - 20-30 seconds hold - Seated Long Arc Quad with Ankle Weight  - 1 x daily - 5 x weekly - 2 sets - 10 reps - Sit to Stand  - 1 x daily - 5 x weekly - 2 sets - 10 reps - Seated Hamstring Stretch  - 1 x daily - 7 x weekly - 1-3 sets - 3 reps - 20-30 seconds hold  GOALS: Goals reviewed with patient? Yes  SHORT TERM GOALS: Target date: 06/23/24/2024  Patient to be independent with initial HEP. Baseline: HEP initiated Goal status: MET from previous PT sessions    LONG TERM GOALS: Target date: 10/06/24  Patient to be independent with advanced HEP. Baseline: Not yet initiated  Goal status: In progress  Patient to demonstrate Rt LE strength >/=4/5.  Baseline: See above Goal status: MET  -    Pt will improve his walk test to being able to walk the full without rest and reaching at least 1035ft  Baseline: 552ft  Goal Status: In progress    ASSESSMENT:  CLINICAL IMPRESSION:  Pt reports knee pain is improved on the Rt but now hurts on the Lt. He reports biggest issue is going up/down stairs due to endurance and LE strength so continued with step ups and he was able to tolerate 6 step today without increased pain.  OBJECTIVE IMPAIRMENTS: Abnormal gait, decreased balance, difficulty walking, decreased ROM, decreased strength, impaired flexibility, impaired sensation, postural dysfunction, and pain.   ACTIVITY LIMITATIONS: carrying, lifting, bending, standing, squatting, stairs, transfers, bathing, toileting, dressing, locomotion level, and caring for others  PARTICIPATION LIMITATIONS: meal prep, cleaning, laundry, shopping, community activity, occupation, yard work, and church  PERSONAL FACTORS: Age, Past/current experiences, Time since onset of injury/illness/exacerbation, and 3+ comorbidities: HFrEF, atrial fibrillation, VF cardiac arrest s/p ICD, CAD, severe aortic regurgitation s/p AVR 10/2018 and aortic root replacement with CABGx1, aortic  dilation, hypertension, hypothyroidism, type 2 diabetes, multiple myeloma, CKD IIIb-IV, migraine are also affecting patient's functional outcome.   REHAB POTENTIAL: Good  CLINICAL DECISION MAKING: LOW  EVALUATION COMPLEXITY: LOW  PLAN:  PT FREQUENCY: 1-2x/week  PT DURATION: 4 weeks   PLANNED INTERVENTIONS: 97164- PT Re-evaluation, 97110-Therapeutic exercises, 97530- Therapeutic activity, 97112- Neuromuscular re-education, 97535- Self Care, [id number]- Manual therapy, [phone]- Gait training, [phone]- Canalith repositioning, [phone]- Aquatic Therapy, Patient/Family education, Balance training, Stair training, Taping, Dry Needling, Joint mobilization, Spinal mobilization, Vestibular training, DME instructions, Cryotherapy, and Moist heat  PLAN FOR NEXT SESSION: Resume leg press if his knee pain is still decreased; Cont general strength like nustep, Rt LE general but hip focus, and general bil UE (no sig weakness here), Cont [employer] carries with 5 lb KB to simulate walking with golf bag, progress distance as he tolerates  Saddie Raw, PT 10/03/2024, 10:52 AM   10/03/2024 10:03 AM  "

## 2024-10-06 ENCOUNTER — Ambulatory Visit: Admitting: Rehabilitation

## 2024-10-06 DIAGNOSIS — R2681 Unsteadiness on feet: Secondary | ICD-10-CM | POA: Diagnosis not present

## 2024-10-06 DIAGNOSIS — M6281 Muscle weakness (generalized): Secondary | ICD-10-CM

## 2024-10-06 DIAGNOSIS — R2689 Other abnormalities of gait and mobility: Secondary | ICD-10-CM

## 2024-10-06 NOTE — Therapy (Unsigned)
 " OUTPATIENT PHYSICAL THERAPY TREATMENT   Patient Name: Eric [name]., MD MRN: [id number] DOB:[birth year], 74 y.o., male Today's Date: 10/07/2024   PCP:  Catheryn Slocumb, MD  REFERRING PROVIDER: Catheryn Slocumb, MD   END OF SESSION:  PT End of Session - 10/07/24 0825     Visit Number 16   kx   Number of Visits 24    Date for Recertification  11/04/24    PT Start Time 1006    PT Stop Time 1056    PT Time Calculation (min) 50 min    Activity Tolerance Patient tolerated treatment well    Behavior During Therapy [facility] for tasks assessed/performed                    Past Medical History:  Diagnosis Date   Acute blood loss anemia    Acute encephalopathy    Acute hypoxemic respiratory failure (HCC)    Acute idiopathic gout of right ankle    Acute lumbar back pain    Acute on chronic systolic heart failure (HCC)    Acute respiratory failure (HCC)    AKI (acute kidney injury)    Altered mental status    Aortic aneurysm without rupture 02/09/2016   Aortic root enlargement 02/13/2012   Aortic valve regurgitation, acquired 02/13/2012   Arrhythmia    Atrial flutter (HCC) 03/18/2012   Back pain    Cardiac arrest (HCC) 02/01/2016   CHF (congestive heart failure) (HCC)    Chronic anticoagulation 03/04/2013   Chronic combined systolic and diastolic heart failure (HCC)    Chronic kidney disease    kidney fx studies increased    Chronic lower back pain    Chronic renal insufficiency    Chronic systolic heart failure (HCC) 02/13/2012   Recent diagnosis 4 / 2013, LVEF 25% by Echo 12/2011  03/2013: Echo at [facility] Conclusions: 1. Left ventricular ejection fraction estimated by 2D at 40-45 percent. 2. Mild concentric left ventricular hypertrophy. 3. Mild left atrial enlargement. 4. Moderate aortic valve regurgitation. 5. The aortic root at the sinus(es) of valsalva is moderately dilated 6. Mild mitral valve regurgitation. 7.   CKD (chronic kidney disease)    CKD (chronic  kidney disease), stage IV (HCC) 08/06/2013   Creatinine 2.4 on 07/04/13    Claustrophobia    Colon cancer screening 03/04/2013   Debility 02/22/2016   Diabetes mellitus type 2 in nonobese [facility])    Diabetes mellitus type 2 in obese    Dysrhythmia    palpitations   Encounter for central line placement    Exertional dyspnea 01/2012   Femoral nerve injury 02/22/2016   Femoral neuropathy    Goals of care, counseling/discussion 05/05/2020   HCAP (healthcare-associated pneumonia)    Heart murmur    Hyperlipidemia 02/13/2012   Hypertension    Hypothyroidism    Internal hemorrhoids without mention of complication 04/11/2013   Labile blood pressure    Left bundle branch block 02/13/2012   Leg weakness, bilateral    Long term (current) use of anticoagulants 08/06/2013   Eliquis  therapy    Long term current use of amiodarone  08/07/2016   Lower extremity weakness    Migraine 02/13/2012   opthalmic   Multiple myeloma (HCC) 05/05/2020   Non-traumatic rhabdomyolysis    Obesity (BMI 30-39.9) 02/13/2012   Pain    Paroxysmal atrial fibrillation (HCC)    Pneumonia    Retroperitoneal bleed    Right ankle pain    Right knee pain  Severe aortic regurgitation 02/09/2016   Special screening for malignant neoplasms, colon 04/11/2013   Thyroid  activity decreased    Tibial pain    Varicose vein of leg    right   Ventricular fibrillation (HCC) 02/09/2016   Weakness of both lower extremities    Past Surgical History:  Procedure Laterality Date   AORTIC VALVE REPLACEMENT  11/20/2018   BIV ICD GENERATOR CHANGEOUT N/A 08/29/2024   Procedure: BIV ICD GENERATOR CHANGEOUT;  Surgeon: Waddell Danelle ORN, MD;  Location: [facility] INVASIVE CV LAB;  Service: Cardiovascular;  Laterality: N/A;   CARDIAC CATHETERIZATION N/A 02/17/2016   Procedure: Left Heart Cath and Coronary Angiography;  Surgeon: Candyce GORMAN Reek, MD;  Location: [facility] INVASIVE CV LAB;  Service: Cardiovascular;  Laterality: N/A;   CARDIOVERSION   03/22/2012   Procedure: CARDIOVERSION;  Surgeon: Oneil Parchment, MD;  Location: [facility] ENDOSCOPY;  Service: Cardiovascular;  Laterality: N/A;   CARDIOVERSION  04/19/2012   Procedure: CARDIOVERSION;  Surgeon: Victory ORN Claudene DOUGLAS, MD;  Location: [facility] OR;  Service: Cardiovascular;  Laterality: N/A;   COLONOSCOPY N/A 04/11/2013   Procedure: COLONOSCOPY;  Surgeon: Lamar JONETTA Aho, MD;  Location: [facility] ENDOSCOPY;  Service: Endoscopy;  Laterality: N/A;   COLONOSCOPY N/A 04/11/2013   Procedure: COLONOSCOPY;  Surgeon: Lamar JONETTA Aho, MD;  Location: [facility] ENDOSCOPY;  Service: Endoscopy;  Laterality: N/A;   EP IMPLANTABLE DEVICE N/A 02/17/2016   Procedure: BiV ICD Insertion CRT-D;  Surgeon: Danelle ORN Waddell, MD;  Location: [facility] INVASIVE CV LAB;  Service: Cardiovascular;  Laterality: N/A;   FINGER SURGERY  2012   4th digit right hand; thumb on left hand   RADIOLOGY WITH ANESTHESIA N/A 02/11/2016   Procedure: MRI OF THE BRAIN WITHOUT CONTRAST, LUMBAR WITHOUT CONTRAST;  Surgeon: Medication Radiologist, MD;  Location: [facility] OR;  Service: Radiology;  Laterality: N/A;  DR. WOOD/MRI   RIGHT/LEFT HEART CATH AND CORONARY ANGIOGRAPHY N/A 07/26/2018   Procedure: RIGHT/LEFT HEART CATH AND CORONARY ANGIOGRAPHY;  Surgeon: Claudene Victory ORN, MD;  Location: [facility] INVASIVE CV LAB;  Service: Cardiovascular;  Laterality: N/A;   Skin melanocytoma excision  2012   above left clavicle   STERNAL INCISION RECLOSURE  11/2018   STERNAL WIRE REMOVAL  11/2018   STERNAL WOUND DEBRIDEMENT  11/2018   TEE WITHOUT CARDIOVERSION  03/22/2012   Procedure: TRANSESOPHAGEAL ECHOCARDIOGRAM (TEE);  Surgeon: Oneil Parchment, MD;  Location: [facility] ENDOSCOPY;  Service: Cardiovascular;  Laterality: N/A;   TEE WITHOUT CARDIOVERSION N/A 02/08/2016   Procedure: TRANSESOPHAGEAL ECHOCARDIOGRAM (TEE);  Surgeon: Redell GORMAN Shallow, MD;  Location: [facility] ENDOSCOPY;  Service: Cardiovascular;  Laterality: N/A;   Patient Active Problem List   Diagnosis Date Noted   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    Umbilical hernia without obstruction and without gangrene 05/11/2024   Chalazion of right upper eyelid 05/11/2024   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 05/11/2024   Venous stasis 05/11/2024   Adjustment insomnia 01/01/2024   Presence of heart assist device (HCC) 12/31/2023   Enlarged prostate 09/10/2023   Dyslipidemia due to type 2 diabetes mellitus (HCC) 09/10/2023   Acquired thrombophilia 05/14/2023   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   Goals of care, counseling/discussion 05/05/2020   Multiple myeloma (HCC) 05/05/2020   S/P flap graft 01/07/2019   Coronary artery disease 01/07/2019   Long term (current) use of antibiotics 01/03/2019   Essential hypertension 12/27/2018   Postoperative infection of wound of sternum 12/27/2018   Decreased strength, endurance, and mobility 12/27/2018   Cardiac LV  ejection fraction 30-35% 12/27/2018   Decreased activities of daily living (ADL) 12/27/2018   Klebsiella infection 12/27/2018   S/P AVR (aortic valve replacement) 11/23/2018   S/P ascending aortic aneurysm repair 11/23/2018   NICM (nonischemic cardiomyopathy) (HCC) 11/19/2018   AICD (automatic cardioverter/defibrillator) present 11/19/2018   Other symptoms and signs involving the musculoskeletal system 05/08/2018   Long term current use of amiodarone  08/07/2016   Type 2 diabetes mellitus with stage 3b chronic kidney disease, without long-term current use of insulin  (HCC)    Femoral neuropathy    Leg weakness, bilateral    PAF (paroxysmal atrial fibrillation) (HCC)    Thyroid  activity decreased    Arrhythmia    Long term current use of anticoagulant 03/04/2013   Atrial flutter (HCC) 03/18/2012    Class: Acute   Chronic combined systolic and diastolic CHF (congestive heart failure) (HCC) 02/13/2012    Class: Acute   Hypertension, accelerated 02/13/2012   Hyperlipidemia 02/13/2012   Left bundle branch block 02/13/2012   Obesity (BMI 30-39.9)  02/13/2012    ONSET DATE:   REFERRING DIAG:  Diagnosis R53.1,Z74.09,R68.89 (ICD-10-CM) - Decreased strength, endurance, and mobility   THERAPY DIAG:  Unsteadiness on feet  Muscle weakness (generalized)  Other abnormalities of gait and mobility  Rationale for Evaluation and Treatment: Rehabilitation  SUBJECTIVE:                                                                                                                                                                                             SUBJECTIVE STATEMENT:  The Lt knee hurts at the patella.   Eval: I have peripheral neuropathy in the Rt leg.  I am also noticing some weakness in both of the arms maybe from taking the ozempic .  I may want to do more aquatic therapy.  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  I have tingling on the inner groin, knee and front of the leg.  Nothing really in the foot.   I am playing 9 holes of golf lately.  I walk at home.  I go back to the [employer] to ride the bike.    PERTINENT HISTORY: , atrial fibrillation, cardiac arrest s/p ICD, CAD, severe aortic regurgitation s/p AVR 10/2018 and aortic root replacement with CABGx1, aortic dilation, hypertension, hypothyroidism, type 2 diabetes, multiple myeloma, CKD IIIb-IV, migraine  PAIN:  PAIN:  Are you having pain? 3/10 Lt knee   PRECAUTIONS: Fall and ICD/Pacemaker  RED FLAGS: None   WEIGHT BEARING RESTRICTIONS: No  FALLS: Has patient fallen in last 6 months? No  LIVING ENVIRONMENT: Lives  with: alone  Lives in: House/apartment Stairs: 4 steps to enter; 3 story home Has following equipment at home: Shower bench, and raised commode  PLOF: Independent;  working part time as Ophthalmologist- requires sitting, standing, walking   PATIENT GOALS: improve R LE strength and bil UE strength   OBJECTIVE:  Note: Objective measures were completed at Evaluation unless otherwise noted.  DIAGNOSTIC  FINDINGS: none recent  COGNITION: Overall cognitive status: Within functional limits for tasks assessed   SENSATION: Intact in B LEs to light touch   POSTURE: rounded shoulders and forward head  LOWER EXTREMITY ROM:     Active  Right Eval Left Eval  Hip flexion    Hip extension    Hip abduction    Hip adduction    Hip internal rotation    Hip external rotation    Knee flexion    Knee extension    Ankle dorsiflexion    Ankle plantarflexion    Ankle inversion    Ankle eversion     (Blank rows = not tested)  LOWER EXTREMITY MMT:    MMT Right Eval RIGHT  08/11/24 Left Eval LEFT 08/11/24  Hip flexion 3+ 4- 4 4+  Hip extension      Hip abduction 4+  4+   Hip adduction 4  4   Hip internal rotation 4+  4+   Hip external rotation 4-  4   Knee flexion 5 5 5 5   Knee extension 4- 5 4+ 5  Ankle dorsiflexion 5  5   Ankle plantarflexion 4+ 4+ 4+ 4+  Ankle inversion      Ankle eversion      (Blank rows = not tested)  UPPER EXTREMITY MMT:  MMT Right eval Left eval  Shoulder flexion 4 4  Shoulder extension    Shoulder abduction 4 4  Shoulder adduction    Shoulder extension    Shoulder internal rotation 5 5  Shoulder external rotation 5 5  Middle trapezius    Lower trapezius    Elbow flexion 4+ 5  Elbow extension 4+ 4+  Wrist flexion    Wrist extension    Wrist ulnar deviation    Wrist radial deviation    Wrist pronation    Wrist supination    Grip strength 58 40   (Blank rows = not tested)  GAIT: Gait pattern: Significant R-ward trunk lean with gait; slightly slowed gait speed  Assistive device utilized: None Level of assistance: Modified independence  FUNCTIONAL TESTS:  5x sit to stand:   walk: able to walk 539ft stopping due to the Rt leg feeling tight.  SOB level 4/10 08/11/24: 6 min walk test - 704.7 ft with break after 3 minutes (437 ft due to SOB and Rt leg feeling tight), SOB level 3/10  12/15/2: 6 min walk test - 637 ft with break after  3 minutes , SOB level 2/10, Rt knee pain 6/10 (knee is the limitation today)  06/25/24 SL stance:  Rt: unable to perform without UE support; with  single arm support able to hold for 20 Lt: 3 seconds without UE support; 20 with single arm UE support  09/08/24 Rt:  4 sec Lt:  5 sec  Tandem stance:  Rt: 20 with single arm UE support  Lt: 30 without UE support     TODAY'S TREATMENT:   10/06/24: Therapeutic Exercises -NuStep LE 10 level 4 x 8 min (LE 10/UE 12) -Supine:  -Hamstring stretch with strap bil 3x20 with minA  for leg hold  -LTR -SKTC 2x20 bil  -Supine ball squeeze 5 x 15 -Hooklying band abduction green x 15 -bridge x 10 Standing in parallel bars:  -Bil calf stretch holding // bars 2 reps, x 30 sec holds Neuromuscular Re Ed -In // bars for bil hip 3 way raises (into flex, abd and ext) x 10 each with SLS no oval -S[place] with 5 lb kettle bell to simulate golf bag carry around clinic, able to walk 335 ft before rest required due to feeling Rt leg tightening up  10/03/24: Therapeutic Exercises -NuStep LE 10 level 4 x 8 min (LE 10/UE 12) -Supine LTR due to increased back pain due after nustep -SKTC  -Supine ball squeeze 3 x 15 -Hooklying band abduction green x 15 -bridge x 10 -Bil calf stretch holding // bars 2 reps, x 30 sec holds -Seated edge of chair for bil HS stretch x 2 reps, 20 sec (try supine next time due to increased back pain) Neuromuscular Re Ed -In // bars for bil hip 3 way raises (into flex, abd and ext) x 10 each with SLS no oval -Sara Lee with 5 lb kettle bell to simulate golf bag carry around clinic, able to walk 335 ft before rest required due to feeling Rt leg tightening up  09/29/24: Therapeutic Exercises NuStep LE 10 level 4 x 8 min (LE 10/UE 12) Bil calf stretch holding // bars 2 reps, x 30 sec holds Standing with foot in chair behind him for quad stretch and holding // bars x 2 reps, 20 sec holds each; able to do 2 reps today without  increased pain (After Sara Lee) Seated edge of chair for bil HS stretch x 2 reps, 20 sec Seated bil figure 4 piriformis stretch x 2 reps, 20 sec holds Neuromuscular Re Ed In // bars for bil hip 3 way raises (into flex, abd and ext) x 10 each with SLS on blue oval, seated rest after Sara Lee with 5 lb kettle bell to simulate golf bag carry around clinic, able to walk 335 ft before rest required due to feeling Rt leg tightening up Therapeutic Activities Seated hip abd with yellow loop x 12 Seated alt march with yellow loop around knees x 10 each  Glut taps against wall standing on purple balance pad for mini squat (chair in front of pt for HHA for safety but pt did not need to hold on today),   2 x 10 and pt with good technique, no knee discomfort. Seated rest after 6 step ups x 10 reps with 2-3 sec SLS on step each rep in // bars, bil LE's  09/12/24: Therapeutic Exercises NuStep LE 10 level 4 x 8 min (LE 10/UE 12) After // bars for seated rest break:  Seated bil HS stretch x 2 reps, 20 sec holds Piriformis figure 4 stretch bil x 2 reps, 20 sec holds, reports tightness but able to perform with Rt knee feeling tight Standing calf stretch with foot on incline of // bars x 2 reps, 20 sec holds each Standing with foot in chair behind him for quad stretch and holding // bars x 1 reps, 20 sec holds each, pt reports not wanting to do second set due to knee discomfort Neuromuscular Re Ed In // bars continued without weight due to knee pain (flex, abd, seated rest, and then ext) 2 x 10 each with SLS on blue oval Therapeutic Activities 6 toe taps in // bars 2 x 10, pt did  well with high step. Second set encouraged less support from UE's but still requires +2 HHA, seated rest after Calf/heel raise x 10, reports feeling increased soreness with Rt toe raise Glut taps against wall in standing for mini squat to work within knee pain tolerance (chair in front of pt for HHA for stability) 2 x 10, no  increased pain.  Seated hip adduction with purple ball squeeze x 20, 5 sec holds Seated hip abd with yellow loop x 10  PATIENT EDUCATION: Education details: per today's note Person educated: Patient Education method: Explanation, Demonstration, Tactile cues, Verbal cues, and Handouts Education comprehension: verbalized understanding and returned demonstration  HOME EXERCISE PROGRAM: Access Code: [email or web address] URL: [email or web address] Date: 06/23/2024 Prepared by: Saddie Raw  Exercises - Standing Hip Abduction with Counter Support  - 1 x daily - 5 x weekly - 2 sets - 10 reps - Side Stepping with Resistance at Ankles and Counter Support  - 1 x daily - 5 x weekly - 1 sets - 3-5 reps - Standing Hip Extension with Counter Support  - 1 x daily - 5 x weekly - 1-3 sets - 10 reps - 20-30 seconds hold - Seated Long Arc Quad with Ankle Weight  - 1 x daily - 5 x weekly - 2 sets - 10 reps - Sit to Stand  - 1 x daily - 5 x weekly - 2 sets - 10 reps - Seated Hamstring Stretch  - 1 x daily - 7 x weekly - 1-3 sets - 3 reps - 20-30 seconds hold  GOALS: Goals reviewed with patient? Yes  SHORT TERM GOALS: Target date: 06/23/24/2024  Patient to be independent with initial HEP. Baseline: HEP initiated Goal status: MET from previous PT sessions    LONG TERM GOALS: Target date: 11/04/24  Patient to be independent with advanced HEP. Baseline: Not yet initiated  Goal status: In progress  Patient to demonstrate Rt LE strength >/=4/5.  Baseline: See above Goal status: MET  -    Pt will improve his walk test to being able to walk the full without rest and reaching at least 1037ft  Baseline: 520ft  Goal Status: In progress - up to 635 ft but still with rest needed    ASSESSMENT:  CLINICAL IMPRESSION:  Pt reports he still gets pretty sore after PT sessions and that his Lt knee is now the painful one and it is located around the patellar tendon.  Avoided step ups and squats  today. Extended POC x 4 more weeks to make more progress towards goals.   OBJECTIVE IMPAIRMENTS: Abnormal gait, decreased balance, difficulty walking, decreased ROM, decreased strength, impaired flexibility, impaired sensation, postural dysfunction, and pain.   ACTIVITY LIMITATIONS: carrying, lifting, bending, standing, squatting, stairs, transfers, bathing, toileting, dressing, locomotion level, and caring for others  PARTICIPATION LIMITATIONS: meal prep, cleaning, laundry, shopping, community activity, occupation, yard work, and church  PERSONAL FACTORS: Age, Past/current experiences, Time since onset of injury/illness/exacerbation, and 3+ comorbidities: HFrEF, atrial fibrillation, VF cardiac arrest s/p ICD, CAD, severe aortic regurgitation s/p AVR 10/2018 and aortic root replacement with CABGx1, aortic dilation, hypertension, hypothyroidism, type 2 diabetes, multiple myeloma, CKD IIIb-IV, migraine are also affecting patient's functional outcome.   REHAB POTENTIAL: Good  CLINICAL DECISION MAKING: LOW  EVALUATION COMPLEXITY: LOW  PLAN:  PT FREQUENCY: 1-2x/week  PT DURATION: 4 weeks   PLANNED INTERVENTIONS: [id number]- PT Re-evaluation, 97110-Therapeutic exercises, 97530- Therapeutic activity, [id number]- Neuromuscular re-education, 97535- Self Care, 02859- Manual therapy, [id number]- Gait training, [phone]-  Canalith repositioning, [id number]- Aquatic Therapy, Patient/Family education, Balance training, Stair training, Taping, Dry Needling, Joint mobilization, Spinal mobilization, Vestibular training, DME instructions, Cryotherapy, and Moist heat  PLAN FOR NEXT SESSION: Resume leg press if his knee pain is still decreased; Cont general strength like nustep, Rt LE general but hip focus, and general bil UE (no sig weakness here), Cont [employer] carries with 5 lb KB to simulate walking with golf bag, progress distance as he tolerates  Saddie Raw, PT 10/07/2024, 8:29 AM   10/07/2024 8:29 AM  "

## 2024-10-07 ENCOUNTER — Encounter: Payer: Self-pay | Admitting: Rehabilitation

## 2024-10-10 ENCOUNTER — Ambulatory Visit: Admitting: Rehabilitation

## 2024-10-10 ENCOUNTER — Ambulatory Visit: Attending: Cardiology

## 2024-10-10 ENCOUNTER — Encounter: Payer: Self-pay | Admitting: Hematology & Oncology

## 2024-10-10 ENCOUNTER — Emergency Department (HOSPITAL_BASED_OUTPATIENT_CLINIC_OR_DEPARTMENT_OTHER)
Admission: EM | Admit: 2024-10-10 | Discharge: 2024-10-10 | Disposition: A | Discharge status: Home / Self Care | Attending: Emergency Medicine | Admitting: Emergency Medicine

## 2024-10-10 ENCOUNTER — Emergency Department (HOSPITAL_BASED_OUTPATIENT_CLINIC_OR_DEPARTMENT_OTHER): Admitting: Radiology

## 2024-10-10 ENCOUNTER — Other Ambulatory Visit: Payer: Self-pay

## 2024-10-10 ENCOUNTER — Encounter

## 2024-10-10 DIAGNOSIS — E1122 Type 2 diabetes mellitus with diabetic chronic kidney disease: Secondary | ICD-10-CM | POA: Diagnosis not present

## 2024-10-10 DIAGNOSIS — M7989 Other specified soft tissue disorders: Secondary | ICD-10-CM | POA: Diagnosis not present

## 2024-10-10 DIAGNOSIS — N189 Chronic kidney disease, unspecified: Secondary | ICD-10-CM | POA: Insufficient documentation

## 2024-10-10 DIAGNOSIS — Z7901 Long term (current) use of anticoagulants: Secondary | ICD-10-CM | POA: Insufficient documentation

## 2024-10-10 DIAGNOSIS — I4891 Unspecified atrial fibrillation: Secondary | ICD-10-CM | POA: Diagnosis not present

## 2024-10-10 DIAGNOSIS — Z79899 Other long term (current) drug therapy: Secondary | ICD-10-CM | POA: Diagnosis not present

## 2024-10-10 DIAGNOSIS — I509 Heart failure, unspecified: Secondary | ICD-10-CM | POA: Insufficient documentation

## 2024-10-10 DIAGNOSIS — I428 Other cardiomyopathies: Secondary | ICD-10-CM

## 2024-10-10 DIAGNOSIS — M25562 Pain in left knee: Secondary | ICD-10-CM | POA: Diagnosis present

## 2024-10-10 LAB — CBC WITH DIFFERENTIAL/PLATELET
Abs Immature Granulocytes: 0.01 K/uL (ref 0.00–0.07)
Basophils Absolute: 0 K/uL (ref 0.0–0.1)
Basophils Relative: 0 %
Eosinophils Absolute: 0.1 K/uL (ref 0.0–0.5)
Eosinophils Relative: 2 %
HCT: 32.5 % — ABNORMAL LOW (ref 39.0–52.0)
Hemoglobin: 10.8 g/dL — ABNORMAL LOW (ref 13.0–17.0)
Immature Granulocytes: 0 %
Lymphocytes Relative: 14 %
Lymphs Abs: 0.8 K/uL (ref 0.7–4.0)
MCH: 31 pg (ref 26.0–34.0)
MCHC: 33.2 g/dL (ref 30.0–36.0)
MCV: 93.4 fL (ref 80.0–100.0)
Monocytes Absolute: 0.9 K/uL (ref 0.1–1.0)
Monocytes Relative: 17 %
Neutro Abs: 3.5 K/uL (ref 1.7–7.7)
Neutrophils Relative %: 67 %
Platelets: 174 K/uL (ref 150–400)
RBC: 3.48 MIL/uL — ABNORMAL LOW (ref 4.22–5.81)
RDW: 18.3 % — ABNORMAL HIGH (ref 11.5–15.5)
WBC: 5.4 K/uL (ref 4.0–10.5)
nRBC: 0 % (ref 0.0–0.2)

## 2024-10-10 LAB — BASIC METABOLIC PANEL WITH GFR
Anion gap: 12 (ref 5–15)
BUN: 38 mg/dL — ABNORMAL HIGH (ref 8–23)
CO2: 24 mmol/L (ref 22–32)
Calcium: 9.2 mg/dL (ref 8.9–10.3)
Chloride: 106 mmol/L (ref 98–111)
Creatinine, Ser: 2.53 mg/dL — ABNORMAL HIGH (ref 0.61–1.24)
GFR, Estimated: 26 mL/min — ABNORMAL LOW
Glucose, Bld: 91 mg/dL (ref 70–99)
Potassium: 4 mmol/L (ref 3.5–5.1)
Sodium: 142 mmol/L (ref 135–145)

## 2024-10-10 MED ORDER — PREDNISONE 10 MG PO TABS: 20.0000 mg | ORAL_TABLET | Freq: Every day | ORAL | 0 refills | Status: AC

## 2024-10-10 MED ORDER — ACETAMINOPHEN 500 MG PO TABS
1000.0000 mg | ORAL_TABLET | Freq: Once | ORAL | Status: AC
  Administered 2024-10-10: 1000 mg via ORAL
  Filled 2024-10-10: qty 2

## 2024-10-10 MED ORDER — LIDOCAINE 5 % EX PTCH
1.0000 | MEDICATED_PATCH | Freq: Once | CUTANEOUS | Status: DC
  Administered 2024-10-10: 1 via TRANSDERMAL
  Filled 2024-10-10: qty 1

## 2024-10-10 NOTE — ED Triage Notes (Signed)
 Reports Left knee pain x 5 days. States been in PT for bilateral knee pain. Ambulatory.

## 2024-10-10 NOTE — Discharge Instructions (Signed)
 Gaither Cyrus Raddle., MD  Thank you for allowing us  to take care of you today.  You came to the Emergency Department today because of increased left knee pain after increasing your activity with physical therapy.  Here in the emergency department your exam is reassuring, you do have a mild amount of swelling, however there is not a severe amount of swelling or fluid in your knee that is concerning for infection or gout.  Your blood counts are normal, your kidney function is at baseline.  Your x-ray does not show any broken bones, bones that are out of place, or lots of fluid in your knee which would be concerning for infection and gout.  We recommend continuing to use the Voltaren  gel daily, you have to use it for approximately 7 days before you will see benefit.  Additionally you can use 500 mg of Tylenol  every 4-6 hours as needed for pain control.  Additionally we will prescribe you a few days of prednisone , a steroid to help decrease inflammation, additionally you can use over-the-counter lidocaine  patches, which you can put on your knee for 12 hours at a time.  To-Do: 1. Please follow-up with your primary doctor within 1 - 2 weeks / as soon as possible.    Please return to the Emergency Department or call 911 if you experience have worsening of your symptoms, or do not get better, worsening swelling and pain of your knee, chest pain, shortness of breath, severe or significantly worsening pain, high fever, severe confusion, pass out or have any reason to think that you need emergency medical care.   We hope you feel better soon.   Mitzie Later, MD Department of Emergency Medicine [facility]

## 2024-10-10 NOTE — ED Provider Notes (Signed)
 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 Patient arrived via POV.  History provided by patient.  No interpreter required during this encounter.  Patient reports that he has bilateral knee pain chronically for which he attends physical therapy, reports that at baseline, right knee pain is greater than left.  Reports that over the past 3 days he has had left greater than right knee pain which is atypical for him, and he also has felt some swelling to the knee.  Denies any fever, chills.  Reports that he is still unable to attend physical therapy, and he has partial improvement when doing the stationary bike.  Reports that he has been trialing OTC medications at home, including Voltaren  gel with partial improvement.  Denies any new or different swelling in his legs, trauma or falls.  Patient's recorded medical, surgical, social, medication list and allergies were reviewed in the Snapshot window as part of the initial history.   Prior to Admission medications  Medication Sig Start Date End Date Taking? Authorizing Provider  predniSONE  (DELTASONE ) 10 MG tablet Take 2 tablets (20 mg total) by mouth daily for 4 days. 10/10/24 10/14/24 Yes Rogelia Jerilynn RAMAN, MD  acetaminophen  (TYLENOL ) 500 MG tablet Take 1,000 mg by mouth every 4 (four) hours as needed for moderate pain or headache.    [provider]  allopurinol  (ZYLOPRIM ) 100 MG tablet Take 1 tablet (100 mg total) by mouth daily. 09/01/24   Jarold Medici, MD  amiodarone  (PACERONE ) 100 MG tablet TAKE 1 TABLET(100 MG) BY MOUTH DAILY 08/20/24   Raford Riggs, MD  apixaban  (ELIQUIS ) 5 MG TABS tablet Take 1  tablet (5 mg total) by mouth 2 (two) times daily. TAKE 1 TABLET(5 MG) BY MOUTH TWICE DAILY Patient taking differently: Take 5 mg by mouth 2 (two) times daily. TAKE 1 TABLET(5 MG) BY MOUTH TWICE DAILY Taking 5 mg ONCE daily since PPM implant due to nosebleeds 09/01/24   Jarold Medici, MD  atorvastatin  (LIPITOR) 40 MG tablet Take 1 tablet (40 mg total) by mouth daily. 09/01/24 09/01/25  Jarold Medici, MD  calcitRIOL (ROCALTROL) 0.25 MCG capsule Take 0.25 mcg by mouth 3 (three) times a week. 07/04/24   [provider]  carvedilol  (COREG ) 6.25 MG tablet Take 1 tablet (6.25 mg total) by mouth 2 (two) times daily. 09/01/24   Jarold Medici, MD  cholecalciferol (VITAMIN D3) 25 MCG (1000 UNIT) tablet Take 5,000 Units by mouth daily.    [provider]  dapagliflozin  propanediol (FARXIGA ) 10 MG TABS tablet Take 1 tablet (10 mg total) by mouth daily. 09/16/24   Raford Riggs, MD  fluticasone  (FLONASE ) 50 MCG/ACT nasal spray Place 2 sprays into both nostrils daily. 01/13/21   Jarold Medici, MD  furosemide  (LASIX ) 80 MG tablet Take 1 tablet (80 mg total) by mouth daily. OK TO TAKE EXTRA TABLET DAILY AS NEEDED FOR FLUID RETENTION(EDEMA) 08/01/24   Raford Riggs, MD  Hypromellose (ARTIFICIAL TEARS OP) Place 1 drop into both eyes daily as needed (dry eyes).    [provider]  levothyroxine  (SYNTHROID ) 75 MCG tablet Take 1 tablet (75 mcg total) by mouth daily before breakfast. 09/01/24  Jarold Medici, MD  loperamide (IMODIUM) 2 MG capsule Take 2 mg by mouth as needed for diarrhea or loose stools.    [provider]  Na Sulfate-K Sulfate-Mg Sulfate concentrate (SUPREP) 17.5-3.13-1.6 GM/177ML SOLN Take 1 kit by mouth once. Patient not taking: Reported on 10/01/2024 05/28/24   [provider]  neomycin -polymyxin b-dexamethasone  (MAXITROL) 3.5-10000-0.1 OINT Place 1 Application into the right eye daily. 05/16/24   [provider]  neomycin -polymyxin-dexamethasone   (MAXITROL) 0.1 % ophthalmic suspension Place 1 drop into the right eye See admin instructions. 3-4 times daily 05/18/24   [provider]  olmesartan (BENICAR) 5 MG tablet Take 5 mg by mouth at bedtime. 12/28/23   [provider]  OZEMPIC , 0.25 OR 0.5 MG/DOSE, 2 MG/3ML SOPN INJECT 0.5 MG UNDER THE SKIN ONCE WEEKLY 07/09/24   Jarold Medici, MD  potassium chloride  20 MEQ/15ML (10%) SOLN TAKE 15 ML BY MOUTH TWICE DAILY Patient taking differently: Take 15 mLs by mouth daily. 01/10/24   Raford Riggs, MD  temazepam  (RESTORIL ) 15 MG capsule Take 1 capsule (15 mg total) by mouth at bedtime as needed for sleep. 09/01/24   Jarold Medici, MD  vitamin B-12 (CYANOCOBALAMIN ) 100 MCG tablet Take 100 mcg by mouth daily.    [provider]  vitamin C (ASCORBIC ACID) 500 MG tablet Take 500 mg by mouth daily as needed.    [provider]  prochlorperazine  (COMPAZINE ) 10 MG tablet Take 1 tablet (10 mg total) by mouth every 6 (six) hours as needed (Nausea or vomiting). 01/13/22 06/02/22  Timmy Maude SAUNDERS, MD     Allergies: Lactose and Lactose intolerance (gi)   Review of Systems   ROS as per HPI  Physical Exam Updated Vital Signs BP (!) 121/93 (BP Location: Right Arm)   Pulse 74   Temp 98 F (36.7 C)   Resp 17   SpO2 95%  Physical Exam Vitals and nursing note reviewed.  Constitutional:      General: He is not in acute distress.    Appearance: Normal appearance.  HENT:     Head: Normocephalic and atraumatic.  Eyes:     Extraocular Movements: Extraocular movements intact.  Cardiovascular:     Rate and Rhythm: Normal rate.     Pulses: Normal pulses.  Pulmonary:     Effort: Pulmonary effort is normal.  Musculoskeletal:     Comments: Slight swelling to the left knee, no joint line tenderness, instability.  Moderate pain with range of motion actively or passively  Skin:    General: Skin is warm and dry.     Capillary Refill: Capillary refill takes less than 2  seconds.  Neurological:     General: No focal deficit present.     Mental Status: He is alert and oriented to person, place, and time.     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  Physical exam is pertinent for slight swelling of the left knee with pain with ROM.   The differential includes but is not limited to fracture, dislocation, arthritis, gout, septic arthritis, degenerative changes.  Independent historian: None  External data reviewed: Labs: reviewed prior  labs for baseline  Initial Plan:  Screening labs including CBC and Metabolic panel to evaluate for infectious or metabolic etiology of disease.  X-ray of the knee to evaluate for structural/infectious bony pathology.  Objective evaluation as below reviewed   Labs: Ordered, Independent interpretation, and Details: CBC without leukocytosis or thrombocytopenia.  Stable anemia in comparison to prior.  BMP with stable renal dysfunction on comparison to prior, no electrolyte derangement or anion gap elevation  Radiology: Ordered, Independent interpretation, Details: Personally reviewed x-ray of the knee, do not appreciate displaced fracture, dislocation, or significant joint effusion, and All images reviewed independently.  Agree with radiology report at this time.   DG Knee Complete 4 Views Left Result Date: 10/10/2024 CLINICAL DATA:  Left knee pain for 5 days without reported injury. EXAM: LEFT KNEE - COMPLETE 4+ VIEW COMPARISON:  None Available. FINDINGS: No evidence of fracture, dislocation, or joint effusion. No evidence of arthropathy or other focal bone abnormality. Soft tissues are  unremarkable. IMPRESSION: Negative. Electronically Signed   By: Lynwood Landy [name] M.D.   On: 10/10/2024 13:17   CUP PACEART INCLINIC DEVICE CHECK Result Date: 09/11/2024 Normal multi chamber ICD wound check programmed VVIR post generator change. Wound incision well healed. Swelling noted.  Hematoma soft to palpitation.  Evaluated with Daphne Barrack NP.  Recommend pressure dressing.  Will continue Eliquis  at this time.  Follow up Monday with device clinic for pressure dressing removal and site reevaluation.  Presenting rhythm: BP 70. Routine testing performed.  Thresholds, sensing, and impedance demonstrate stable parameters. No treated arrhythmias. Estimated longevity 7 years. Reviewed shock plan.  Pt enrolled in remote follow-up. Programming adjustments per advisement of [place] with [employer].  See attachment for details.Randall Sharps, BSN, RN   EKG/Medicine tests: Not indicated EKG Interpretation:                  Interventions: Tylenol , lidocaine  patch  See the EMR for full details regarding lab and imaging results.  Patient presents to the emergency department for knee pain, is afebrile, denies any subjective fevers or chills, does not have erythema, severe swelling to the left knee, no recent trauma or falls, however patient has had recent increase in activity via physical therapy.  Will obtain screening labs to evaluate for gross abnormalities such as leukocytosis, as well as x-ray to evaluate for effusion, other abnormalities.  Labs and imaging reveal no significant effusion or other bony derangement, there is no leukocytosis.  Lack of leukocytosis, lack of fever, and no effusion on x-ray make etiology such as septic arthritis (or gout) less likely, feel that patient's presentation is most consistent with overuse injury.  Recommended relative rest, continuation of Voltaren  gel as well as as needed Tylenol  and lidocaine  patches.  Additionally will prescribe short prednisone  taper for anti-inflammatory  effects, recommended close follow-up with PCP, patient comfortable with this plan, discharged in stable condition.  Presentation is most consistent with acute complicated illness and Current presentation is complicated by underlying chronic conditions  Discussion of management or test interpretations with external provider(s): Not indicated  Risk Drugs:OTC drugs and Prescription drug management  Disposition: DISCHARGE: I believe that the patient is safe for discharge home with outpatient follow-up. Patient was informed of all pertinent physical exam, laboratory, and imaging findings. Patient's suspected etiology of their symptom presentation was discussed with the patient and all questions were answered. We discussed following up with PCP. I provided thorough ED return precautions. The patient feels safe and comfortable with this plan.  MDM generated using voice dictation software and may contain dictation errors.  Please contact me for any clarification or with any questions.  Clinical Impression:  1. Acute pain of left knee      Discharge   Final Clinical Impression(s) / ED Diagnoses Final diagnoses:  Acute pain of left knee    Rx / DC Orders ED Discharge Orders          Ordered    predniSONE  (DELTASONE ) 10 MG tablet  Daily        10/10/24 1434             Rogelia Jerilynn RAMAN, MD 10/10/24 1613  "

## 2024-10-13 ENCOUNTER — Other Ambulatory Visit: Payer: Self-pay

## 2024-10-13 ENCOUNTER — Inpatient Hospital Stay: Attending: Hematology & Oncology

## 2024-10-13 ENCOUNTER — Inpatient Hospital Stay: Admitting: Hematology & Oncology

## 2024-10-13 VITALS — BP 125/81 | HR 73 | Temp 97.8°F | Resp 16 | Ht 68.5 in | Wt 201.0 lb

## 2024-10-13 DIAGNOSIS — M7989 Other specified soft tissue disorders: Secondary | ICD-10-CM | POA: Insufficient documentation

## 2024-10-13 DIAGNOSIS — C9000 Multiple myeloma not having achieved remission: Secondary | ICD-10-CM | POA: Insufficient documentation

## 2024-10-13 DIAGNOSIS — C9001 Multiple myeloma in remission: Secondary | ICD-10-CM

## 2024-10-13 DIAGNOSIS — Z79899 Other long term (current) drug therapy: Secondary | ICD-10-CM | POA: Insufficient documentation

## 2024-10-13 LAB — CBC WITH DIFFERENTIAL (CANCER CENTER ONLY)
Abs Immature Granulocytes: 0.03 K/uL (ref 0.00–0.07)
Basophils Absolute: 0 K/uL (ref 0.0–0.1)
Basophils Relative: 0 %
Eosinophils Absolute: 0 K/uL (ref 0.0–0.5)
Eosinophils Relative: 0 %
HCT: 33.6 % — ABNORMAL LOW (ref 39.0–52.0)
Hemoglobin: 11.2 g/dL — ABNORMAL LOW (ref 13.0–17.0)
Immature Granulocytes: 0 %
Lymphocytes Relative: 10 %
Lymphs Abs: 0.8 K/uL (ref 0.7–4.0)
MCH: 30.9 pg (ref 26.0–34.0)
MCHC: 33.3 g/dL (ref 30.0–36.0)
MCV: 92.8 fL (ref 80.0–100.0)
Monocytes Absolute: 0.8 K/uL (ref 0.1–1.0)
Monocytes Relative: 10 %
Neutro Abs: 6.1 K/uL (ref 1.7–7.7)
Neutrophils Relative %: 80 %
Platelet Count: 185 K/uL (ref 150–400)
RBC: 3.62 MIL/uL — ABNORMAL LOW (ref 4.22–5.81)
RDW: 18.3 % — ABNORMAL HIGH (ref 11.5–15.5)
WBC Count: 7.8 K/uL (ref 4.0–10.5)
nRBC: 0 % (ref 0.0–0.2)

## 2024-10-13 LAB — CUP PACEART REMOTE DEVICE CHECK
Battery Remaining Longevity: 85 mo
Battery Remaining Percentage: 92 %
Battery Voltage: 3.02 V
Date Time Interrogation Session: 20260116135852
HighPow Impedance: 39 Ohm
Implantable Lead Connection Status: 753985
Implantable Lead Connection Status: 753985
Implantable Lead Connection Status: 753985
Implantable Lead Implant Date: 20170525
Implantable Lead Implant Date: 20170525
Implantable Lead Implant Date: 20170525
Implantable Lead Location: 753858
Implantable Lead Location: 753859
Implantable Lead Location: 753860
Implantable Lead Model: 7122
Implantable Pulse Generator Implant Date: 20251205
Lead Channel Impedance Value: 350 Ohm
Lead Channel Impedance Value: 430 Ohm
Lead Channel Impedance Value: 530 Ohm
Lead Channel Pacing Threshold Amplitude: 1 V
Lead Channel Pacing Threshold Amplitude: 1.25 V
Lead Channel Pacing Threshold Pulse Width: 0.5 ms
Lead Channel Pacing Threshold Pulse Width: 0.5 ms
Lead Channel Sensing Intrinsic Amplitude: 0.2 mV
Lead Channel Sensing Intrinsic Amplitude: 11.3 mV
Lead Channel Setting Pacing Amplitude: 2 V
Lead Channel Setting Pacing Amplitude: 2.5 V
Lead Channel Setting Pacing Pulse Width: 0.5 ms
Lead Channel Setting Pacing Pulse Width: 0.5 ms
Lead Channel Setting Sensing Sensitivity: 0.5 mV
Pulse Gen Serial Number: 111074816
Zone Setting Status: 755011

## 2024-10-13 LAB — CMP (CANCER CENTER ONLY)
ALT: 20 U/L (ref 0–44)
AST: 32 U/L (ref 15–41)
Albumin: 3.9 g/dL (ref 3.5–5.0)
Alkaline Phosphatase: 97 U/L (ref 38–126)
Anion gap: 11 (ref 5–15)
BUN: 51 mg/dL — ABNORMAL HIGH (ref 8–23)
CO2: 22 mmol/L (ref 22–32)
Calcium: 8.8 mg/dL — ABNORMAL LOW (ref 8.9–10.3)
Chloride: 108 mmol/L (ref 98–111)
Creatinine: 2.63 mg/dL — ABNORMAL HIGH (ref 0.61–1.24)
GFR, Estimated: 25 mL/min — ABNORMAL LOW
Glucose, Bld: 113 mg/dL — ABNORMAL HIGH (ref 70–99)
Potassium: 4.1 mmol/L (ref 3.5–5.1)
Sodium: 141 mmol/L (ref 135–145)
Total Bilirubin: 1.4 mg/dL — ABNORMAL HIGH (ref 0.0–1.2)
Total Protein: 6.7 g/dL (ref 6.5–8.1)

## 2024-10-13 LAB — LACTATE DEHYDROGENASE: LDH: 192 U/L (ref 105–235)

## 2024-10-13 NOTE — Progress Notes (Signed)
 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 10/13/2024   Principle Diagnosis:  IgG kappa myeloma  - 1q+   Current Therapy:        CyBorD -- s/p cycle  #19 - started on 06/04/2020 --last treatment in 08/2022 --on hold for now.   Interim History:  Eric [name] is here today for follow-up.  We last saw him back in September.  He made it through the Holiday season.  His wife had passed away earlier in the year.  I know it was been tough on him over the holidays.  This is the first holiday season without his wife.  His son came up from [place]  to be with him.  I did had a good time together.  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  When we last saw him, there was no monoclonal spike in his blood.  His IgG level was 1043 mg/dL.  His kappa light chain was 4.1 mg/dL.  This has been creeping up slowly.  He has had no bony pain.  He has had no change in bowel or bladder habits.  He has had no cough or shortness of breath.  Is been no problems with infections.  Of note, he did have the battery change in his defibrillator /pacemaker about 4 weeks ago.  His had no leg swelling.  He has had no bleeding.  He has had no rashes.  Overall, I will say his performance status is by ECOG 0.  Medications:  Allergies as of 10/13/2024       Reactions   Lactose Diarrhea   Lactose Intolerance (gi) Diarrhea        Medication List        Accurate as of October 13, 2024 11:10 AM. If you have any questions, ask your nurse or doctor.          acetaminophen  500 MG tablet Commonly known as: TYLENOL  Take 1,000 mg by mouth every 4 (four) hours as needed for moderate pain or headache.   allopurinol  100 MG tablet Commonly known as: Zyloprim  Take 1 tablet (100 mg total) by mouth daily.   amiodarone  100 MG tablet Commonly known as: PACERONE  TAKE 1 TABLET(100 MG) BY MOUTH DAILY   apixaban  5 MG Tabs  tablet Commonly known as: Eliquis  Take 1 tablet (5 mg total) by mouth 2 (two) times daily. TAKE 1 TABLET(5 MG) BY MOUTH TWICE DAILY What changed: additional instructions   ARTIFICIAL TEARS OP Place 1 drop into both eyes daily as needed (dry eyes).   ascorbic acid 500 MG tablet Commonly known as: VITAMIN C Take 500 mg by mouth daily as needed.   atorvastatin  40 MG tablet Commonly known as: Lipitor Take 1 tablet (40 mg total) by mouth daily.   calcitRIOL 0.25 MCG capsule Commonly known as: ROCALTROL Take 0.25 mcg by mouth 3 (three) times a week.   carvedilol  6.25 MG tablet Commonly known as: COREG  Take 1 tablet (6.25 mg total) by mouth 2 (two) times daily.   cholecalciferol 25 MCG (1000 UNIT) tablet Commonly known as: VITAMIN D3 Take 5,000 Units by mouth daily.   dapagliflozin  propanediol 10 MG Tabs tablet Commonly known as: FARXIGA  Take 1 tablet (10 mg total) by mouth daily.   fluticasone  50 MCG/ACT nasal spray Commonly known as: FLONASE  Place 2 sprays into both nostrils daily.   furosemide  80 MG tablet Commonly known as: LASIX  Take 1  tablet (80 mg total) by mouth daily. OK TO TAKE EXTRA TABLET DAILY AS NEEDED FOR FLUID RETENTION(EDEMA)   levothyroxine  75 MCG tablet Commonly known as: SYNTHROID  Take 1 tablet (75 mcg total) by mouth daily before breakfast.   loperamide 2 MG capsule Commonly known as: IMODIUM Take 2 mg by mouth as needed for diarrhea or loose stools.   Na Sulfate-K Sulfate-Mg Sulfate concentrate 17.5-3.13-1.6 GM/177ML Soln Commonly known as: SUPREP Take 1 kit by mouth once.   neomycin -polymyxin b-dexamethasone  3.5-10000-0.1 Oint Commonly known as: MAXITROL Place 1 Application into the right eye daily.   neomycin -polymyxin-dexamethasone  0.1 % ophthalmic suspension Commonly known as: MAXITROL Place 1 drop into the right eye See admin instructions. 3-4 times daily   olmesartan 5 MG tablet Commonly known as: BENICAR Take 5 mg by mouth at  bedtime.   Ozempic  (0.25 or 0.5 MG/DOSE) 2 MG/3ML Sopn Generic drug: Semaglutide (0.25 or 0.5MG /DOS) INJECT 0.5 MG UNDER THE SKIN ONCE WEEKLY   potassium chloride  20 MEQ/15ML (10%) Soln TAKE 15 ML BY MOUTH TWICE DAILY What changed: See the new instructions.   predniSONE  10 MG tablet Commonly known as: DELTASONE  Take 2 tablets (20 mg total) by mouth daily for 4 days.   temazepam  15 MG capsule Commonly known as: RESTORIL  Take 1 capsule (15 mg total) by mouth at bedtime as needed for sleep.   vitamin B-12 100 MCG tablet Commonly known as: CYANOCOBALAMIN  Take 100 mcg by mouth daily.        Allergies:  Allergies  Allergen Reactions   Lactose Diarrhea   Lactose Intolerance (Gi) Diarrhea    Past Medical History, Surgical history, Social history, and Family History were reviewed and updated.  Review of Systems: Review of Systems  Constitutional: Negative.   Eyes: Negative.   Respiratory: Negative.    Cardiovascular:  Positive for leg swelling.  Gastrointestinal: Negative.   Genitourinary: Negative.   Musculoskeletal: Negative.   Skin: Negative.   Neurological: Negative.   Endo/Heme/Allergies: Negative.   Psychiatric/Behavioral: Negative.       Physical Exam:  Temperature is 97.8.  Pulse 73.  Blood pressure 125/81.  Weight is 201 pound.       Wt Readings from Last 3 Encounters:  10/13/24 201 lb (91.2 kg)  10/01/24 199 lb 14.4 oz (90.7 kg)  09/01/24 202 lb 12.8 oz (92 kg)    Physical Exam Vitals reviewed.  HENT:     Head: Normocephalic and atraumatic.  Eyes:     Pupils: Pupils are equal, round, and reactive to light.  Cardiovascular:     Rate and Rhythm: Normal rate and regular rhythm.     Heart sounds: Normal heart sounds.  Pulmonary:     Effort: Pulmonary effort is normal.     Breath sounds: Normal breath sounds.  Abdominal:     General: Bowel sounds are normal.     Palpations: Abdomen is soft.  Musculoskeletal:        General: No tenderness or  deformity. Normal range of motion.     Cervical back: Normal range of motion.  Lymphadenopathy:     Cervical: No cervical adenopathy.  Skin:    General: Skin is warm and dry.     Findings: No erythema or rash.  Neurological:     Mental Status: He is alert and oriented to person, place, and time.  Psychiatric:        Behavior: Behavior normal.        Thought Content: Thought content normal.  Judgment: Judgment normal.     Lab Results  Component Value Date   WBC 7.8 10/13/2024   HGB 11.2 (L) 10/13/2024   HCT 33.6 (L) 10/13/2024   MCV 92.8 10/13/2024   PLT 185 10/13/2024   Lab Results  Component Value Date   FERRITIN 132 09/01/2024   IRON 33 (L) 09/01/2024   TIBC 334 09/01/2024   UIBC 301 09/01/2024   IRONPCTSAT 10 (L) 09/01/2024   Lab Results  Component Value Date   RETICCTPCT 1.4 09/15/2022   RBC 3.62 (L) 10/13/2024   Lab Results  Component Value Date   KPAFRELGTCHN 41.2 (H) 06/11/2024   LAMBDASER 41.4 (H) 06/11/2024   KAPLAMBRATIO 1.00 06/11/2024   Lab Results  Component Value Date   IGGSERUM 1,043 06/11/2024   IGA 252 06/11/2024   IGMSERUM 30 06/11/2024   Lab Results  Component Value Date   TOTALPROTELP 5.9 (L) 06/11/2024   ALBUMINELP 3.2 06/11/2024   A1GS 0.3 06/11/2024   A2GS 0.5 06/11/2024   BETS 1.0 06/11/2024   GAMS 0.9 06/11/2024   MSPIKE Not Observed 06/11/2024   SPEI Comment 04/22/2021     Chemistry      Component Value Date/Time   NA 141 10/13/2024 1015   NA 143 09/01/2024 1615   K 4.1 10/13/2024 1015   CL 108 10/13/2024 1015   CO2 22 10/13/2024 1015   BUN 51 (H) 10/13/2024 1015   BUN 39 (H) 09/01/2024 1615   CREATININE 2.63 (H) 10/13/2024 1015   CREATININE 1.95 (H) 09/08/2016 0859      Component Value Date/Time   CALCIUM  8.8 (L) 10/13/2024 1015   ALKPHOS 97 10/13/2024 1015   AST 32 10/13/2024 1015   ALT 20 10/13/2024 1015   BILITOT 1.4 (H) 10/13/2024 1015       Impression and Plan: 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  He has done incredibly well with treatment.    Again, he has been off treatment now for over 2 years.   His monoclonal studies have all looked pretty stable.  We did watch out for the kappa light chain which is slowly creeping up.   I will plan to get him back in the Spring.   Maude JONELLE Crease, MD 1/19/202611:10 AM  "

## 2024-10-14 ENCOUNTER — Ambulatory Visit: Payer: Self-pay | Admitting: Cardiology

## 2024-10-14 LAB — IGG, IGA, IGM
IgA: 316 mg/dL (ref 61–437)
IgG (Immunoglobin G), Serum: 1266 mg/dL (ref 603–1613)
IgM (Immunoglobulin M), Srm: 35 mg/dL (ref 15–143)

## 2024-10-14 LAB — KAPPA/LAMBDA LIGHT CHAINS
Kappa free light chain: 37.8 mg/L — ABNORMAL HIGH (ref 3.3–19.4)
Kappa, lambda light chain ratio: 0.88 (ref 0.26–1.65)
Lambda free light chains: 43.1 mg/L — ABNORMAL HIGH (ref 5.7–26.3)

## 2024-10-15 LAB — PROTEIN ELECTROPHORESIS, SERUM, WITH REFLEX
A/G Ratio: 1.2 (ref 0.7–1.7)
Albumin ELP: 3.6 g/dL (ref 2.9–4.4)
Alpha-1-Globulin: 0.3 g/dL (ref 0.0–0.4)
Alpha-2-Globulin: 0.5 g/dL (ref 0.4–1.0)
Beta Globulin: 1.1 g/dL (ref 0.7–1.3)
Gamma Globulin: 1.1 g/dL (ref 0.4–1.8)
Globulin, Total: 3 g/dL (ref 2.2–3.9)
Total Protein ELP: 6.6 g/dL (ref 6.0–8.5)

## 2024-10-16 NOTE — Progress Notes (Signed)
 Remote ICD Transmission

## 2024-10-29 ENCOUNTER — Telehealth (HOSPITAL_COMMUNITY): Payer: Self-pay | Admitting: *Deleted

## 2024-10-29 NOTE — Telephone Encounter (Signed)

## 2024-10-30 ENCOUNTER — Other Ambulatory Visit (HOSPITAL_BASED_OUTPATIENT_CLINIC_OR_DEPARTMENT_OTHER): Payer: Self-pay | Admitting: Cardiovascular Disease

## 2024-10-30 DIAGNOSIS — I5042 Chronic combined systolic (congestive) and diastolic (congestive) heart failure: Secondary | ICD-10-CM

## 2024-10-30 DIAGNOSIS — I480 Paroxysmal atrial fibrillation: Secondary | ICD-10-CM

## 2024-10-30 DIAGNOSIS — Z01818 Encounter for other preprocedural examination: Secondary | ICD-10-CM

## 2024-10-31 ENCOUNTER — Encounter (HOSPITAL_COMMUNITY): Payer: Self-pay | Admitting: Cardiovascular Disease

## 2024-11-03 ENCOUNTER — Inpatient Hospital Stay (HOSPITAL_COMMUNITY): Admission: RE | Admit: 2024-11-03 | Source: Ambulatory Visit | Attending: Cardiovascular Disease

## 2024-11-10 ENCOUNTER — Encounter

## 2024-11-28 ENCOUNTER — Ambulatory Visit: Admitting: Pulmonary Disease

## 2024-12-12 ENCOUNTER — Ambulatory Visit (HOSPITAL_BASED_OUTPATIENT_CLINIC_OR_DEPARTMENT_OTHER): Admitting: Cardiovascular Disease

## 2024-12-26 ENCOUNTER — Ambulatory Visit: Admitting: Podiatry

## 2025-01-05 ENCOUNTER — Ambulatory Visit: Admitting: Internal Medicine

## 2025-01-09 ENCOUNTER — Ambulatory Visit

## 2025-01-12 ENCOUNTER — Inpatient Hospital Stay: Admitting: Hematology & Oncology

## 2025-01-12 ENCOUNTER — Inpatient Hospital Stay

## 2025-04-10 ENCOUNTER — Ambulatory Visit

## 2025-07-10 ENCOUNTER — Ambulatory Visit
